# Patient Record
Sex: Female | Born: 1955
Health system: Southern US, Community
[De-identification: ages and names within clinical notes are randomized; demographics above are authoritative.]

## PROBLEM LIST (undated history)

## (undated) DIAGNOSIS — K5900 Constipation, unspecified: Secondary | ICD-10-CM

## (undated) DIAGNOSIS — E119 Type 2 diabetes mellitus without complications: Secondary | ICD-10-CM

## (undated) DIAGNOSIS — IMO0001 Reserved for inherently not codable concepts without codable children: Secondary | ICD-10-CM

## (undated) DIAGNOSIS — I313 Pericardial effusion (noninflammatory): Secondary | ICD-10-CM

## (undated) DIAGNOSIS — I3139 Other pericardial effusion (noninflammatory): Secondary | ICD-10-CM

## (undated) DIAGNOSIS — J95821 Acute postprocedural respiratory failure: Secondary | ICD-10-CM

## (undated) DIAGNOSIS — I340 Nonrheumatic mitral (valve) insufficiency: Secondary | ICD-10-CM

## (undated) DIAGNOSIS — Z9889 Other specified postprocedural states: Secondary | ICD-10-CM

## (undated) DIAGNOSIS — I272 Pulmonary hypertension, unspecified: Secondary | ICD-10-CM

## (undated) DIAGNOSIS — E785 Hyperlipidemia, unspecified: Secondary | ICD-10-CM

## (undated) DIAGNOSIS — I5032 Chronic diastolic (congestive) heart failure: Secondary | ICD-10-CM

## (undated) DIAGNOSIS — K5792 Diverticulitis of intestine, part unspecified, without perforation or abscess without bleeding: Secondary | ICD-10-CM

## (undated) DIAGNOSIS — D649 Anemia, unspecified: Secondary | ICD-10-CM

## (undated) DIAGNOSIS — I639 Cerebral infarction, unspecified: Secondary | ICD-10-CM

## (undated) DIAGNOSIS — I10 Essential (primary) hypertension: Secondary | ICD-10-CM

## (undated) DIAGNOSIS — J9600 Acute respiratory failure, unspecified whether with hypoxia or hypercapnia: Secondary | ICD-10-CM

## (undated) DIAGNOSIS — R011 Cardiac murmur, unspecified: Secondary | ICD-10-CM

## (undated) DIAGNOSIS — Z973 Presence of spectacles and contact lenses: Secondary | ICD-10-CM

## (undated) DIAGNOSIS — N184 Chronic kidney disease, stage 4 (severe): Secondary | ICD-10-CM

## (undated) DIAGNOSIS — Z9289 Personal history of other medical treatment: Secondary | ICD-10-CM

## (undated) HISTORY — DX: Anemia, unspecified: D64.9

## (undated) HISTORY — DX: Chronic diastolic (congestive) heart failure: I50.32

## (undated) HISTORY — DX: Type 2 diabetes mellitus without complications: E11.9

## (undated) HISTORY — DX: Diverticulitis of intestine, part unspecified, without perforation or abscess without bleeding: K57.92

## (undated) HISTORY — PX: COLONOSCOPY: SHX174

## (undated) HISTORY — DX: Essential (primary) hypertension: I10

## (undated) HISTORY — DX: Pulmonary hypertension, unspecified: I27.20

## (undated) HISTORY — DX: Nonrheumatic mitral (valve) insufficiency: I34.0

## (undated) HISTORY — PX: CARDIAC CATHETERIZATION: SHX172

## (undated) HISTORY — DX: Cerebral infarction, unspecified: I63.9

---

## 1995-11-28 DIAGNOSIS — I639 Cerebral infarction, unspecified: Secondary | ICD-10-CM

## 1995-11-28 HISTORY — DX: Cerebral infarction, unspecified: I63.9

## 1998-04-19 ENCOUNTER — Encounter: Admission: RE | Admit: 1998-04-19 | Discharge: 1998-04-19 | Payer: Self-pay | Admitting: Internal Medicine

## 1998-04-19 ENCOUNTER — Other Ambulatory Visit: Admission: RE | Admit: 1998-04-19 | Discharge: 1998-04-19 | Payer: Self-pay

## 1998-04-21 ENCOUNTER — Encounter: Admission: RE | Admit: 1998-04-21 | Discharge: 1998-04-21 | Payer: Self-pay | Admitting: Hematology and Oncology

## 1998-07-16 ENCOUNTER — Encounter: Admission: RE | Admit: 1998-07-16 | Discharge: 1998-07-16 | Payer: Self-pay | Admitting: Internal Medicine

## 1998-07-27 ENCOUNTER — Emergency Department (HOSPITAL_COMMUNITY): Admission: EM | Admit: 1998-07-27 | Discharge: 1998-07-27 | Payer: Self-pay | Admitting: Emergency Medicine

## 1998-09-13 ENCOUNTER — Ambulatory Visit (HOSPITAL_COMMUNITY): Admission: RE | Admit: 1998-09-13 | Discharge: 1998-09-13 | Payer: Self-pay | Admitting: Hematology and Oncology

## 1998-09-13 ENCOUNTER — Encounter: Admission: RE | Admit: 1998-09-13 | Discharge: 1998-09-13 | Payer: Self-pay | Admitting: Hematology and Oncology

## 1998-12-07 ENCOUNTER — Encounter: Admission: RE | Admit: 1998-12-07 | Discharge: 1998-12-07 | Payer: Self-pay | Admitting: Internal Medicine

## 1998-12-27 ENCOUNTER — Ambulatory Visit (HOSPITAL_COMMUNITY): Admission: RE | Admit: 1998-12-27 | Discharge: 1998-12-27 | Payer: Self-pay | Admitting: *Deleted

## 1999-05-16 ENCOUNTER — Encounter: Admission: RE | Admit: 1999-05-16 | Discharge: 1999-05-16 | Payer: Self-pay | Admitting: Internal Medicine

## 1999-06-21 ENCOUNTER — Encounter: Admission: RE | Admit: 1999-06-21 | Discharge: 1999-06-21 | Payer: Self-pay | Admitting: Internal Medicine

## 2000-02-20 ENCOUNTER — Emergency Department (HOSPITAL_COMMUNITY): Admission: EM | Admit: 2000-02-20 | Discharge: 2000-02-20 | Payer: Self-pay | Admitting: Emergency Medicine

## 2000-12-11 ENCOUNTER — Other Ambulatory Visit: Admission: RE | Admit: 2000-12-11 | Discharge: 2000-12-11 | Payer: Self-pay | Admitting: Internal Medicine

## 2001-02-19 ENCOUNTER — Encounter: Payer: Self-pay | Admitting: Internal Medicine

## 2001-02-19 ENCOUNTER — Ambulatory Visit (HOSPITAL_COMMUNITY): Admission: RE | Admit: 2001-02-19 | Discharge: 2001-02-19 | Payer: Self-pay | Admitting: Internal Medicine

## 2002-03-06 ENCOUNTER — Ambulatory Visit (HOSPITAL_COMMUNITY): Admission: RE | Admit: 2002-03-06 | Discharge: 2002-03-06 | Payer: Self-pay | Admitting: Internal Medicine

## 2002-03-06 ENCOUNTER — Encounter: Payer: Self-pay | Admitting: Internal Medicine

## 2003-02-03 ENCOUNTER — Emergency Department (HOSPITAL_COMMUNITY): Admission: EM | Admit: 2003-02-03 | Discharge: 2003-02-03 | Payer: Self-pay | Admitting: Emergency Medicine

## 2003-03-31 ENCOUNTER — Encounter: Payer: Self-pay | Admitting: Internal Medicine

## 2003-03-31 ENCOUNTER — Ambulatory Visit (HOSPITAL_COMMUNITY): Admission: RE | Admit: 2003-03-31 | Discharge: 2003-03-31 | Payer: Self-pay | Admitting: Internal Medicine

## 2005-05-15 ENCOUNTER — Ambulatory Visit (HOSPITAL_COMMUNITY): Admission: RE | Admit: 2005-05-15 | Discharge: 2005-05-15 | Payer: Self-pay | Admitting: Gastroenterology

## 2006-07-12 ENCOUNTER — Ambulatory Visit (HOSPITAL_COMMUNITY): Admission: RE | Admit: 2006-07-12 | Discharge: 2006-07-12 | Payer: Self-pay | Admitting: Internal Medicine

## 2007-08-01 ENCOUNTER — Ambulatory Visit (HOSPITAL_COMMUNITY): Admission: RE | Admit: 2007-08-01 | Discharge: 2007-08-01 | Payer: Self-pay | Admitting: Internal Medicine

## 2007-11-27 ENCOUNTER — Other Ambulatory Visit: Admission: RE | Admit: 2007-11-27 | Discharge: 2007-11-27 | Payer: Self-pay | Admitting: Internal Medicine

## 2007-11-27 LAB — HM PAP SMEAR
HM Pap smear: NEGATIVE
HM Pap smear: NEGATIVE

## 2008-07-28 ENCOUNTER — Emergency Department (HOSPITAL_COMMUNITY): Admission: EM | Admit: 2008-07-28 | Discharge: 2008-07-29 | Payer: Self-pay | Admitting: Emergency Medicine

## 2008-08-13 ENCOUNTER — Encounter (INDEPENDENT_AMBULATORY_CARE_PROVIDER_SITE_OTHER): Payer: Self-pay | Admitting: Emergency Medicine

## 2008-08-13 ENCOUNTER — Inpatient Hospital Stay (HOSPITAL_COMMUNITY): Admission: EM | Admit: 2008-08-13 | Discharge: 2008-08-17 | Payer: Self-pay | Admitting: Emergency Medicine

## 2009-01-08 ENCOUNTER — Ambulatory Visit (HOSPITAL_COMMUNITY): Admission: RE | Admit: 2009-01-08 | Discharge: 2009-01-08 | Payer: Self-pay | Admitting: Internal Medicine

## 2009-01-13 ENCOUNTER — Encounter (INDEPENDENT_AMBULATORY_CARE_PROVIDER_SITE_OTHER): Payer: Self-pay | Admitting: Internal Medicine

## 2009-01-13 ENCOUNTER — Encounter: Admission: RE | Admit: 2009-01-13 | Discharge: 2009-01-13 | Payer: Self-pay | Admitting: Internal Medicine

## 2009-01-13 ENCOUNTER — Ambulatory Visit (HOSPITAL_COMMUNITY): Admission: RE | Admit: 2009-01-13 | Discharge: 2009-01-13 | Payer: Self-pay | Admitting: Internal Medicine

## 2010-01-14 ENCOUNTER — Ambulatory Visit (HOSPITAL_COMMUNITY): Admission: RE | Admit: 2010-01-14 | Discharge: 2010-01-14 | Payer: Self-pay | Admitting: Internal Medicine

## 2011-01-31 ENCOUNTER — Other Ambulatory Visit (HOSPITAL_COMMUNITY): Payer: Self-pay | Admitting: Internal Medicine

## 2011-01-31 DIAGNOSIS — Z1231 Encounter for screening mammogram for malignant neoplasm of breast: Secondary | ICD-10-CM

## 2011-02-06 ENCOUNTER — Ambulatory Visit (HOSPITAL_COMMUNITY)
Admission: RE | Admit: 2011-02-06 | Discharge: 2011-02-06 | Disposition: A | Discharge status: Home / Self Care | Payer: BC Managed Care – PPO | Source: Ambulatory Visit | Attending: Internal Medicine | Admitting: Internal Medicine

## 2011-02-06 DIAGNOSIS — Z1231 Encounter for screening mammogram for malignant neoplasm of breast: Secondary | ICD-10-CM | POA: Insufficient documentation

## 2011-03-30 ENCOUNTER — Inpatient Hospital Stay (INDEPENDENT_AMBULATORY_CARE_PROVIDER_SITE_OTHER)
Admission: RE | Admit: 2011-03-30 | Discharge: 2011-03-30 | Disposition: A | Discharge status: Home / Self Care | Payer: BC Managed Care – PPO | Source: Ambulatory Visit | Attending: Family Medicine | Admitting: Family Medicine

## 2011-03-30 DIAGNOSIS — E119 Type 2 diabetes mellitus without complications: Secondary | ICD-10-CM

## 2011-03-30 DIAGNOSIS — M5430 Sciatica, unspecified side: Secondary | ICD-10-CM

## 2011-03-30 DIAGNOSIS — I10 Essential (primary) hypertension: Secondary | ICD-10-CM

## 2011-04-11 NOTE — H&P (Signed)
Jody Nguyen, LA NO.:  [id number]   MEDICAL RECORD NO.:  [id number]          PATIENT TYPE:  EMS   LOCATION:  MAJO                         FACILITY:  [facility]   PHYSICIAN:  Rexene Alberts, M.D.    DATE OF BIRTH:  March 25, 1956   DATE OF ADMISSION:  08/13/2008  DATE OF DISCHARGE:                              HISTORY & PHYSICAL   PRIMARY CARE PHYSICIAN:  Unk Pinto, M.D.   CHIEF COMPLAINT:  Abnormal labs at the doctor's office.  Also, resolving  nausea and vomiting.   HISTORY OF PRESENT ILLNESS:  The patient is a 55 year old woman with a  past medical history significant for hypertension, type 2 diabetes  mellitus, and hyperlipidemia.  She presents to the emergency department  after her primary care physician, Dr. Melford Aase, called her because of  abnormal lab results as ordered yesterday on August 12, 2008.  The  patient actually presented to the emergency department on July 28, 2008, with a chief complaint of abdominal pain.  During the evaluation  in the emergency department at that time, a CT scan of the abdomen and  pelvis with IV and oral contrast was ordered.  The results were  significant for uncomplicated diverticulitis of the distal descending  colon.  The patient was prescribed Cipro and Flagyl and discharged home.  Over the days following the discharge, the patient's abdominal pain  resolved.  However, 3-4 days ago, she developed a generalized ill  feeling and later experienced several episodes of nausea and vomiting.  Over the past 48 hours, she estimates that she has vomited at least  three to four times.  She denies coffee-ground emesis.  She denies  ongoing abdominal pain.  She does have a very poor appetite and has  eaten very little over the past 2 days.  She says that her urine output  has been about the same.  She denies painful urination, urinary  frequency, or urinary hesitancy.  Because of the nausea and vomiting,  she presented to  Dr. Idell Pickles office yesterday.  He ordered lab  studies.  The results from the laboratory studies as faxed over to the  emergency department by Dr. Melford Aase revealed that the patient's BUN was  103 and her creatinine was 13.2.  In comparison, when the patient was  evaluated in the emergency department on September 1, her BUN was within  normal limits at 10 and her creatinine was within normal limits at 0.92.  The patient will be admitted for further evaluation and management.   PAST MEDICAL HISTORY:  1. Hypertension.  2. Type 2 diabetes mellitus.  3. Hyperlipidemia.  4. Acute stroke in 1997 with no residual deficits  5. C-section x2.  6. Normal colonoscopy in June 2006 by Dr. Collene Mares  7. Acute diverticulitis, diagnosed July 28, 2008.   MEDICATIONS:  1. Micardis 80 mg daily.  2. Glyburide 5 mg half a tablet b.i.d.  3. Lasix 40 mg daily.  4. Metformin 1000 mg b.i.d.  5. Clonidine 0.3 mg t.i.d.  6. Crestor 20 mg half a tablet three times per week.  7. Amlodipine 10  mg daily.  8. Bystolic 10 mg daily.  9. Januvia 100 mg daily.  10.Aspirin 81 mg daily.  11.Vitamin D 2000 units daily.   ALLERGIES:  NO KNOWN DRUG ALLERGIES.   SOCIAL HISTORY:  The patient is married.  She lives in [place],  [place].  She has three children.  She is currently unemployed.  She denies tobacco, alcohol and illicit drug use.   FAMILY HISTORY:  Her mother died of a stroke at 80 years of age.  The  health and whereabouts of her father are unknown.  Her brother who is 78  years of age has heart disease.  Her sister who is 36 years of age has a  history of a stroke.   REVIEW OF SYSTEMS:  The patient's review of systems is positive for  generalized ill feeling, poor appetite, and resolving nausea and  vomiting.  Her review of systems is negative for fever, chills, upper  respiratory infection symptoms, chest pain, shortness of breath,  abdominal pain, diarrhea, constipation, melena,  hematochezia, painful  urination, and swelling in her legs.   PHYSICAL EXAMINATION:  VITAL SIGNS:  Temperature 98.1, blood pressure  101/67, pulse 65, respiratory rate 20, oxygen saturation 95% on room  air.  GENERAL:  The patient is a pleasant obese 55 year old African  American woman who is currently lying in bed in no acute distress.  HEENT:  Head is normocephalic nontraumatic.  Pupils equal, round,  reactive to light.  Extraocular muscles are intact.  Conjunctivae are  clear.  Sclerae are white.  Tympanic membranes not examined.  Nasal  mucosa reveals dry mucous membranes.  No sinus tenderness.  Oropharynx  reveals good dentition.  Mucous membranes are dry.  No posterior  exudates or erythema.  NECK:  Supple.  No adenopathy, no thyromegaly, no bruit or JVD.  LUNGS:  Clear to auscultation bilaterally.  HEART:  S1-S2 with a soft systolic murmur.  ABDOMEN:  Obese, positive bowel sounds, soft, nontender, nondistended.  No hepatosplenomegaly, no masses palpated.  GU/RECTAL:  Deferred.  EXTREMITIES:  Pedal pulses are palpable at 2+ bilaterally.  No pretibial  edema and no pedal edema.  NEUROLOGIC:  The patient is alert and oriented x3.  Cranial nerves II-  XII are intact.  Strength is 5/5 throughout.  Sensation is intact.   ADMISSION LABORATORY:  Sodium 129, potassium 3.8, chloride 95, CO2 20,  glucose 205, BUN 104, creatinine 12.42, total bilirubin 1.3, alkaline  phosphatase 46, SGOT 44, SGPT 36, total protein 7.8, albumin 3.6,  calcium 8.3.  Urinalysis:  Urine blood, small urine protein 100, urine  nitrite negative, urine leukocytes large.  Micro urine with squamous  cells many, WBCs 21-50, RBCs 0-2, bacteria many.   ASSESSMENT:  1. Generalized nausea and vomiting, more than likely secondary to      acute renal failure, possibly uremia.  The patient's nausea and      vomiting are actually resolving.  She was able to eat breakfast and      keep it down this morning.  She has no  complaints of abdominal      pain.  She was treated recently for diverticulitis with Cipro and      Flagyl which she says she completed.  2. Acute renal failure.  The patient probably has acute renal failure      from dye/contrast nephropathy.  She does have underlying diabetes      mellitus and hypertension.  However, her renal function was clearly  within normal limits on July 28, 2008.  3. Mild hepatic transaminitis.  The patient has no complaints of      abdominal pain and her abdomen is benign on exam.  The mildly      elevated liver transaminases may be the consequence of Crestor.  4. Type 2 diabetes mellitus.  The patient's venous glucose is      moderately elevated.  She is treated chronically with Januvia,      glyburide, and metformin.  5. Hypertension.  The patient's blood pressure is controlled at this      point.  She is treated chronically with Micardis, Lasix, clonidine,      amlodipine, and Bystolic.  6. Pyuria.   PLAN:  1. The patient will be admitted for further evaluation and management.  2. Will start gentle IV fluids, supportive treatment, and antiemetic      therapy.  3. Will check a renal ultrasound, urine culture, and urine      eosinophils.  4. Will consult nephrology.  5. Will treat the patient empirically with 1 gram of Rocephin IV      daily.  6. Will hold oral hypoglycemic agents in the setting of acute renal      failure.  We will also hold the Lasix and Micardis.      Rexene Alberts, M.D.  Electronically Signed     DF/MEDQ  D:  08/13/2008  T:  08/13/2008  Job:  [id number]   cc:   Unk Pinto, M.D.

## 2011-04-11 NOTE — Discharge Summary (Signed)
Jody Nguyen, Jody Nguyen              ACCOUNT NO.:  [id number]   MEDICAL RECORD NO.:  [id number]          PATIENT TYPE:  INP   LOCATION:  [id number]                         FACILITY:  [facility]   PHYSICIAN:  Domingo Mend, M.D. DATE OF BIRTH:  Jul 14, 1956   DATE OF ADMISSION:  08/13/2008  DATE OF DISCHARGE:  08/17/2008                               DISCHARGE SUMMARY   DISCHARGE DIAGNOSES:  1. Acute renal failure secondary to angiotensin receptor blocker plus      dehydration plus intravenous contrast dye.  2. Hypertension.  3. Type 2 diabetes.  4. Hyperlipidemia.  5. History of cerebrovascular accident in 1997 with no residual      deficits.   DISCHARGE MEDICATIONS:  1. Bystolic 20 mg p.o. daily.  2. Glyburide 5 mg 1/2 pill twice daily.  3. Lasix 40 mg daily.  4. Metformin 1000 mg b.i.d.  5. Clonidine 0.3 mg t.i.d.  6. Crestor 20 mg half a pill 3 times a week.  7. Amlodipine 10 mg p.o. daily.  8. Januvia 100 mg p.o. daily.  9. Aspirin 81 mg daily.  10.Vitamin D 2000 units daily.   DISPOSITION AND FOLLOWUP:  The patient is discharged home today in a  stable condition.  She is advised to call her primary care physician,  Dr. Melford Aase, and schedule an appointment for her at the latest on  Wednesday of this week.  At that time, BMET should be drawn to follow a  resolution of her acute renal failure.  Upon admission, her creatinine  was about 13; it is 2.15 upon discharge today.   CONSULTATIONS:  Alvin C. Florene Glen, M.D. and Sol Blazing, M.D. of  [place].   IMAGING PERFORMED DURING THIS HOSPITALIZATION:  Includes a renal  ultrasound performed on August 14, 2008, consistent with a bilateral  renal enlargement with increased renal cortical echogenicity.  No  evidence of hydronephrosis.   HISTORY OF PRESENT ILLNESS:  For full details, please refer to history  and physical exam dictated by Dr. Rexene Alberts on August 13, 2008,  but in brief, [name] is a  pleasant 55 year old African American  woman with past medical history significant of hypertension, type 2  diabetes, and hyperlipidemia who went to see her primary care physician,  Dr. Melford Aase on August 12, 2008, and was called by her physician and  told to come into the emergency department secondary to abnormal labs  with lab being a creatinine of 13.2 and a BUN of 103.  Her baseline BUN  and creatinine drawn on July 28, 2008, was a BUN of 10 and a  creatinine of 0.92.  Of note, on July 28, 2008, she came into the  emergency department secondary to abdominal pain where a CT scan of  abdomen and pelvis with IV contrast was ordered, which showed  uncomplicated diverticulitis of the distal descending colon.  The  patient was actually asymptomatic.   HOSPITAL COURSE:  1. Acute renal failure.  Which we believe at this time was secondary      to a combination of IV volume depletion plus the IV contrast plus  the ARB that she was on.  Nephrology consultation was obtained in      the hospital.  We have held her ARB, and she will not be discharged      on it at this time.  She has had a remarkable improvement of her      renal function from a creatinine of 13.2 down to a creatinine of      2.15 today.  Both Nephrology and we believe that she will have a      complete renal recovery with no deficits.  She is instructed to      follow up with her primary care physician, Dr. Melford Aase, on      Wednesday, at which time a stat BMET should be drawn to make sure      that her creatinine continues to normalize.  She has been      completely asymptomatic throughout the hospitalization.  2. Hypertension.  Her blood pressure did increase secondary to      discontinuation of the Micardis, so I have increased her Bystolic      to 20 mg daily from 10 mg daily.  It should be followed up as an      outpatient.  3. She had good glycemia control in the hospital, but she was on      sliding scale  insulin.  She will be discharged on her home regimen      of glyburide, metformin, and Januvia, and they should be followed      up in the outpatient setting.  4. Hyperlipidemia.  Her Crestor will be initiated at discharge.   Vital signs on day of discharge, blood pressure 156/94, heart rate 65,  respirations 16, and O2 sats 98% on room air with a temperature of 98.5.   Labs on day of discharge, sodium 138, potassium 3.7, chloride 107,  bicarb 25, BUN 34, creatinine of 2.15, and a glucose of 134.      Domingo Mend, M.D.  Electronically Signed     EH/MEDQ  D:  08/17/2008  T:  08/17/2008  Job:  [id number]   cc:   Unk Pinto, M.D.  Sol Blazing, M.D.  Darrold Span. Florene Glen, M.D.

## 2011-04-11 NOTE — Consult Note (Signed)
NAMEGUADALUPE, MCFARLANE              ACCOUNT NO.:  [id number]   MEDICAL RECORD NO.:  [id number]          PATIENT TYPE:  INP   LOCATION:  [id number]                         FACILITY:  [facility]   PHYSICIAN:  Alvin C. Florene Glen, M.D.  DATE OF BIRTH:  07-07-1956   DATE OF CONSULTATION:  DATE OF DISCHARGE:                                 CONSULTATION   I was asked by Dr. Caryn Section to see this 55 year old female with acute  renal failure.  The patient went to a local medical doctor the day prior  to admission with low blood sugar and was found to have renal failure  with a BUN of 103 and creatinine of 13.2.  The patient was seen at [facility] on July 29, 2008, with abdominal pain and CT scan of  the abdomen with contrast showed evidence of diverticulitis.  Serum  creatinine at that time was 0.92 mg/dL on July 28, 2008.  Today, BUN  is 104 and creatinine 12.42.  We were asked to see her for further  evaluation.  The patient does complain of malaise.  The patient reports  that she was feeling very poorly beginning of the month after she  underwent CAT scan.  She has fair amount of nausea and vomiting and  diminished oral intake.  She also takes Micardis for high blood  pressure.   PAST MEDICAL HISTORY:  Hypertension, diabetes, dyslipidemia, history of  prior stroke, and diverticulitis.   CURRENT MEDICATIONS:  1. Micardis.  2. Glyburide.  3. Lasix.  4. Metformin.  5. Clonidine.  6. Crestor.  7. Amlodipine.  8. Bystolic.  9. Aspirin.  10.Vitamin D.  11.__________.   ALLERGIES:  No known allergies to medications.   SOCIAL HISTORY:  She is married.  She has 3 children.  She does not  smoke or drink alcohol or consume illegal substances.   FAMILY HISTORY:  Negative for kidney disease.   REVIEW OF SYSTEMS:  Essentially noncontributory.   PHYSICAL EXAMINATION:  VITAL SIGNS:  Blood pressure is 101/67 and heart  rate is 65.  GENERAL:  Alert and oriented African American female.  HEENT:  Atraumatic and normocephalic.  No thyroid masses.  Extraocular  movements are intact.  NECK:  Supple.  LUNGS:  Clear.  HEART:  Regular rhythm and rate.  ABDOMEN:  Obese.  EXTREMITIES:  Without significant edema.  NEUROLOGIC:  No obvious focality.   ASSESSMENT:  1. Acute renal failure, etiology most likely on the basis of contrast.  2. Nephropathy with a component of an angiotensin receptor blocker and      probable dehydration.   RECOMMENDATIONS:  Supportive therapy.  We will check urinalysis.  We  will do dialysis as needed.           ______________________________  Darrold Span Florene Glen, M.D.     ACP/MEDQ  D:  08/13/2008  T:  08/14/2008  Job:  [id number]

## 2011-04-14 NOTE — Op Note (Signed)
Jody Nguyen, Jody Nguyen              ACCOUNT NO.:  [id number]   MEDICAL RECORD NO.:  [id number]          PATIENT TYPE:  AMB   LOCATION:  ENDO                         FACILITY:  [facility]   PHYSICIAN:  Nelwyn Salisbury, M.D.  DATE OF BIRTH:  [birth year]   DATE OF PROCEDURE:  05/15/2005  DATE OF DISCHARGE:                                 OPERATIVE REPORT   PROCEDURE PERFORMED:  Screening colonoscopy.   ENDOSCOPIST:  Nelwyn Salisbury, M.D.   INSTRUMENT USED:  Olympus video colonoscope.   INDICATIONS FOR PROCEDURE:  A 55 year old African-American female undergoing  screening colonoscopy to rule out colonic polyps, masses, etc.   PREPROCEDURE PREPARATION:  Informed consent was procured from the patient.  The patient was fasted for eight hours prior to procedure and prepped with a  bottle of magnesium citrate and gallon of GoLYTELY the night prior to  procedure.  The risks and benefits of the procedure including a 10% risk  rate of cancer and polyps was discussed with the patient as well.   PREPROCEDURE PHYSICAL:  VITAL SIGNS:  The patient has stable vital signs.  NECK:  Supple.  CHEST:  Clear to auscultation.  CARDIAC:  S1, S2 regular.  ABDOMEN:  Soft with normal bowel sounds.   DESCRIPTION OF PROCEDURE:  The patient was placed in the left lateral  decubitus position and sedated with 80 mg of Demerol and 8 mg of Versed in  slow incremental doses.  Once the patient was adequately sedated and  maintained on low-flow oxygen and continuous cardiac monitoring, the Olympus  video colonoscope was advanced from the rectum to the cecum.  The  appendicular orifice and ileocecal valve were visualized and photographed.  No masses, polyps, erosions, ulcerations, or diverticula were seen.  Retroflexion in the rectum revealed no abnormalities.   IMPRESSION:  Normal colonoscopy of the cecum.  No masses, polyps, or  diverticula seen.   RECOMMENDATIONS:  1.  Continue a high-fiber diet with liberal fluid  intake.  2.  Repeat colonoscopy in the next 10 years unless the patient develops any      abnormal symptoms in the interim.  3.  Outpatient followup if the need arises in the future.       JNM/MEDQ  D:  05/15/2005  T:  05/15/2005  Job:  [id number]   cc:   Ardeen Jourdain, M.D.  [place] [address]  Fax: [phone]

## 2011-07-24 ENCOUNTER — Emergency Department (HOSPITAL_COMMUNITY)
Admission: EM | Admit: 2011-07-24 | Discharge: 2011-07-25 | Disposition: A | Discharge status: Home / Self Care | Payer: BC Managed Care – PPO | Attending: Emergency Medicine | Admitting: Emergency Medicine

## 2011-07-24 DIAGNOSIS — Z8673 Personal history of transient ischemic attack (TIA), and cerebral infarction without residual deficits: Secondary | ICD-10-CM | POA: Insufficient documentation

## 2011-07-24 DIAGNOSIS — E119 Type 2 diabetes mellitus without complications: Secondary | ICD-10-CM | POA: Insufficient documentation

## 2011-07-24 DIAGNOSIS — E785 Hyperlipidemia, unspecified: Secondary | ICD-10-CM | POA: Insufficient documentation

## 2011-07-24 DIAGNOSIS — R112 Nausea with vomiting, unspecified: Secondary | ICD-10-CM | POA: Insufficient documentation

## 2011-07-24 DIAGNOSIS — N289 Disorder of kidney and ureter, unspecified: Secondary | ICD-10-CM | POA: Insufficient documentation

## 2011-07-24 DIAGNOSIS — I10 Essential (primary) hypertension: Secondary | ICD-10-CM | POA: Insufficient documentation

## 2011-07-24 LAB — DIFFERENTIAL
Basophils Absolute: 0 10*3/uL (ref 0.0–0.1)
Basophils Relative: 1 % (ref 0–1)
Eosinophils Absolute: 0.3 10*3/uL (ref 0.0–0.7)
Eosinophils Relative: 5 % (ref 0–5)
Lymphocytes Relative: 43 % (ref 12–46)
Lymphs Abs: 2.4 10*3/uL (ref 0.7–4.0)
Monocytes Absolute: 0.4 10*3/uL (ref 0.1–1.0)
Monocytes Relative: 7 % (ref 3–12)
Neutro Abs: 2.5 10*3/uL (ref 1.7–7.7)
Neutrophils Relative %: 45 % (ref 43–77)

## 2011-07-24 LAB — CBC
HCT: 34 % — ABNORMAL LOW (ref 36.0–46.0)
Hemoglobin: 11.6 g/dL — ABNORMAL LOW (ref 12.0–15.0)
MCH: 26.4 pg (ref 26.0–34.0)
MCHC: 34.1 g/dL (ref 30.0–36.0)
MCV: 77.4 fL — ABNORMAL LOW (ref 78.0–100.0)
Platelets: 206 10*3/uL (ref 150–400)
RBC: 4.39 MIL/uL (ref 3.87–5.11)
RDW: 14.2 % (ref 11.5–15.5)
WBC: 5.6 10*3/uL (ref 4.0–10.5)

## 2011-07-24 LAB — GLUCOSE, CAPILLARY: Glucose-Capillary: 244 mg/dL — ABNORMAL HIGH (ref 70–99)

## 2011-07-25 LAB — URINALYSIS, ROUTINE W REFLEX MICROSCOPIC
Bilirubin Urine: NEGATIVE
Glucose, UA: NEGATIVE mg/dL
Ketones, ur: NEGATIVE mg/dL
Nitrite: NEGATIVE
Protein, ur: 100 mg/dL — AB
Specific Gravity, Urine: 1.01 (ref 1.005–1.030)
Urobilinogen, UA: 0.2 mg/dL (ref 0.0–1.0)
pH: 5 (ref 5.0–8.0)

## 2011-07-25 LAB — COMPREHENSIVE METABOLIC PANEL
ALT: 48 U/L — ABNORMAL HIGH (ref 0–35)
AST: 34 U/L (ref 0–37)
Albumin: 3.4 g/dL — ABNORMAL LOW (ref 3.5–5.2)
Alkaline Phosphatase: 59 U/L (ref 39–117)
BUN: 36 mg/dL — ABNORMAL HIGH (ref 6–23)
CO2: 28 mEq/L (ref 19–32)
Calcium: 9.3 mg/dL (ref 8.4–10.5)
Chloride: 95 mEq/L — ABNORMAL LOW (ref 96–112)
Creatinine, Ser: 1.65 mg/dL — ABNORMAL HIGH (ref 0.50–1.10)
GFR calc Af Amer: 39 mL/min — ABNORMAL LOW (ref >60)
GFR calc non Af Amer: 32 mL/min — ABNORMAL LOW (ref >60)
Glucose, Bld: 206 mg/dL — ABNORMAL HIGH (ref 70–99)
Potassium: 3 mEq/L — ABNORMAL LOW (ref 3.5–5.1)
Sodium: 133 mEq/L — ABNORMAL LOW (ref 135–145)
Total Bilirubin: 0.5 mg/dL (ref 0.3–1.2)
Total Protein: 7.3 g/dL (ref 6.0–8.3)

## 2011-07-25 LAB — URINE MICROSCOPIC-ADD ON

## 2011-07-25 LAB — GLUCOSE, CAPILLARY: Glucose-Capillary: 193 mg/dL — ABNORMAL HIGH (ref 70–99)

## 2011-08-10 ENCOUNTER — Other Ambulatory Visit (HOSPITAL_COMMUNITY): Payer: Self-pay | Admitting: Internal Medicine

## 2011-08-10 ENCOUNTER — Ambulatory Visit (HOSPITAL_COMMUNITY)
Admission: RE | Admit: 2011-08-10 | Discharge: 2011-08-10 | Disposition: A | Discharge status: Home / Self Care | Payer: BC Managed Care – PPO | Source: Ambulatory Visit | Attending: Internal Medicine | Admitting: Internal Medicine

## 2011-08-10 DIAGNOSIS — R079 Chest pain, unspecified: Secondary | ICD-10-CM | POA: Insufficient documentation

## 2011-08-10 DIAGNOSIS — I10 Essential (primary) hypertension: Secondary | ICD-10-CM | POA: Insufficient documentation

## 2011-08-10 DIAGNOSIS — R0602 Shortness of breath: Secondary | ICD-10-CM

## 2011-08-10 DIAGNOSIS — I517 Cardiomegaly: Secondary | ICD-10-CM | POA: Insufficient documentation

## 2011-08-16 ENCOUNTER — Encounter: Payer: Self-pay | Admitting: Cardiology

## 2011-08-17 ENCOUNTER — Encounter: Payer: Self-pay | Admitting: Cardiology

## 2011-08-17 ENCOUNTER — Ambulatory Visit (INDEPENDENT_AMBULATORY_CARE_PROVIDER_SITE_OTHER): Payer: BC Managed Care – PPO | Admitting: Cardiology

## 2011-08-17 DIAGNOSIS — I10 Essential (primary) hypertension: Secondary | ICD-10-CM | POA: Insufficient documentation

## 2011-08-17 DIAGNOSIS — E785 Hyperlipidemia, unspecified: Secondary | ICD-10-CM

## 2011-08-17 DIAGNOSIS — R0600 Dyspnea, unspecified: Secondary | ICD-10-CM

## 2011-08-17 DIAGNOSIS — R0602 Shortness of breath: Secondary | ICD-10-CM

## 2011-08-17 DIAGNOSIS — R0989 Other specified symptoms and signs involving the circulatory and respiratory systems: Secondary | ICD-10-CM

## 2011-08-17 DIAGNOSIS — E1169 Type 2 diabetes mellitus with other specified complication: Secondary | ICD-10-CM | POA: Insufficient documentation

## 2011-08-17 DIAGNOSIS — R0609 Other forms of dyspnea: Secondary | ICD-10-CM

## 2011-08-17 NOTE — Patient Instructions (Signed)
Your physician has requested that you have en exercise stress myoview. For further information please visit [email or web address]. Please follow instruction sheet, as given.   Your physician has requested that you have an echocardiogram. Echocardiography is a painless test that uses sound waves to create images of your heart. It provides your doctor with information about the size and shape of your heart and how well your heart's chambers and valves are working. This procedure takes approximately one hour. There are no restrictions for this procedure.

## 2011-08-17 NOTE — Assessment & Plan Note (Signed)
Continue present blood pressure medications. Followup with primary care for this issue.

## 2011-08-17 NOTE — Assessment & Plan Note (Signed)
Etiology unclear. She has multiple cardiac risk factors including 10 years of diabetes mellitus. She also has hypertension. Her electrocardiogram shows left ventricular hypertrophy and there are repolarization abnormalities. Plan stress Myoview for risk stratification. Check echocardiogram for systolic and diastolic function. There may be a component of obesity hypoventilation syndrome. She has been scheduled for a sleep study by her primary care physician. No risk factors for pulmonary embolus.

## 2011-08-17 NOTE — Progress Notes (Signed)
HPI: 55 year old female with no prior cardiac history for evaluation of dyspnea. Chest x-ray on August 10, 2011 revealed cardiac enlargement and pulmonary venous congestion/pulmonary vascular hypertension. Patient complains of approximately 3-4 weeks of increased dyspnea on exertion. There is no orthopnea, PND, pedal edema, palpitations or syncope. She occasionally feels a brief chest pain for 3-4 seconds but does not have exertional chest pain. Because of the above we were asked to further evaluate. Note she has not traveled recently.  Current Outpatient Prescriptions  Medication Sig Dispense Refill  . amLODipine (NORVASC) 10 MG tablet Take 10 mg by mouth daily.        Marland Kitchen aspirin 81 MG tablet Take 81 mg by mouth daily.        . cloNIDine (CATAPRES) 0.2 MG tablet Take 1 tablet by mouth Twice daily.      . ferrous sulfate 325 (65 FE) MG EC tablet Take 325 mg by mouth daily.        . furosemide (LASIX) 40 MG tablet Take 40 mg by mouth 2 (two) times daily. 11/2 tablets once daily       . glyBURIDE (DIABETA) 5 MG tablet Take 1 tablet by mouth Twice daily.      Marland Kitchen losartan (COZAAR) 100 MG tablet Take 100 mg by mouth daily.        . metFORMIN (GLUCOPHAGE) 1000 MG tablet Take 1 tablet by mouth Twice daily.      . minoxidil (LONITEN) 10 MG tablet Take 1 tablet by mouth Daily.      . Nebivolol HCl (BYSTOLIC) 20 MG TABS Take 1 tablet by mouth daily.        . potassium chloride SA (K-DUR,KLOR-CON) 20 MEQ tablet Take 20 mEq by mouth daily.        . rosuvastatin (CRESTOR) 20 MG tablet Take 20 mg by mouth daily.        Marland Kitchen VICTOZA 18 MG/3ML SOLN as directed.        No Known Allergies  Past Medical History  Diagnosis Date  . Obesity   . Elevated cholesterol   . Vitamin D deficiency   . DM (diabetes mellitus)   . Hypertension   . CVA (cerebral infarction)     Past Surgical History  Procedure Date  . Cesarean section     History   Social History  . Marital Status: Married    Spouse Name: N/A    Number of Children: 3  . Years of Education: N/A   Occupational History  .      Unemployed   Social History Main Topics  . Smoking status: Never Smoker   . Smokeless tobacco: Not on file  . Alcohol Use: Not on file  . Drug Use: Not on file  . Sexually Active: Not on file   Other Topics Concern  . Not on file   Social History Narrative  . No narrative on file    Family History  Problem Relation Age of Onset  . Heart attack Brother     MI in his 62s  . Stroke Sister   . Breast cancer Sister   . Stroke Mother     ROS: no fevers or chills, productive cough, hemoptysis, dysphasia, odynophagia, melena, hematochezia, dysuria, hematuria, rash, seizure activity, orthopnea, PND, pedal edema, claudication. Remaining systems are negative.  Physical Exam: General:  Well developed/obese in NAD Skin warm/dry Patient not depressed No peripheral clubbing Back-normal HEENT-normal/normal eyelids Neck supple/normal carotid upstroke bilaterally; no bruits; no JVD; no thyromegaly  chest - CTA/ normal expansion CV - RRR/normal S1 and S2; no murmurs, rubs or gallops;  PMI nondisplaced Abdomen -NT/ND, no HSM, no mass, + bowel sounds, no bruit 2+ femoral pulses, no bruits Ext-no edema, chords, 2+ DP Neuro-grossly nonfocal  ECG 08/10/11 - NSR, LVH, prolonged QT, Lateral TWI

## 2011-08-17 NOTE — Assessment & Plan Note (Signed)
Management per primary care. 

## 2011-08-24 ENCOUNTER — Encounter: Payer: Self-pay | Admitting: *Deleted

## 2011-08-28 LAB — LIPID PANEL
Cholesterol: 125
HDL: 31 — ABNORMAL LOW
LDL Cholesterol: 68
Total CHOL/HDL Ratio: 4
Triglycerides: 131
VLDL: 26

## 2011-08-28 LAB — FERRITIN: Ferritin: 314 — ABNORMAL HIGH (ref 10–291)

## 2011-08-28 LAB — COMPREHENSIVE METABOLIC PANEL
ALT: 36 — ABNORMAL HIGH
AST: 44 — ABNORMAL HIGH
Albumin: 3.6
Alkaline Phosphatase: 46
BUN: 104 — ABNORMAL HIGH
CO2: 20
Calcium: 8.3 — ABNORMAL LOW
Chloride: 95 — ABNORMAL LOW
Creatinine, Ser: 12.42 — ABNORMAL HIGH
GFR calc Af Amer: 4 — ABNORMAL LOW
GFR calc non Af Amer: 3 — ABNORMAL LOW
Glucose, Bld: 205 — ABNORMAL HIGH
Potassium: 3.8
Sodium: 129 — ABNORMAL LOW
Total Bilirubin: 1.3 — ABNORMAL HIGH
Total Protein: 7.8

## 2011-08-28 LAB — DIFFERENTIAL
Basophils Absolute: 0
Basophils Relative: 0
Eosinophils Absolute: 0.3
Eosinophils Relative: 5
Lymphocytes Relative: 24
Lymphs Abs: 1.4
Monocytes Absolute: 0.6
Monocytes Relative: 11
Neutro Abs: 3.6
Neutrophils Relative %: 60

## 2011-08-28 LAB — URINE MICROSCOPIC-ADD ON

## 2011-08-28 LAB — GLUCOSE, CAPILLARY
Glucose-Capillary: 115 — ABNORMAL HIGH
Glucose-Capillary: 124 — ABNORMAL HIGH
Glucose-Capillary: 126 — ABNORMAL HIGH
Glucose-Capillary: 130 — ABNORMAL HIGH
Glucose-Capillary: 139 — ABNORMAL HIGH
Glucose-Capillary: 141 — ABNORMAL HIGH
Glucose-Capillary: 142 — ABNORMAL HIGH
Glucose-Capillary: 152 — ABNORMAL HIGH
Glucose-Capillary: 153 — ABNORMAL HIGH
Glucose-Capillary: 157 — ABNORMAL HIGH
Glucose-Capillary: 183 — ABNORMAL HIGH
Glucose-Capillary: 197 — ABNORMAL HIGH
Glucose-Capillary: 206 — ABNORMAL HIGH
Glucose-Capillary: 240 — ABNORMAL HIGH

## 2011-08-28 LAB — URINALYSIS, ROUTINE W REFLEX MICROSCOPIC
Bilirubin Urine: NEGATIVE
Bilirubin Urine: NEGATIVE
Glucose, UA: NEGATIVE
Glucose, UA: NEGATIVE
Ketones, ur: NEGATIVE
Ketones, ur: NEGATIVE
Nitrite: NEGATIVE
Nitrite: NEGATIVE
Protein, ur: 100 — AB
Protein, ur: 30 — AB
Specific Gravity, Urine: 1.011
Specific Gravity, Urine: 1.012
Urobilinogen, UA: 0.2
Urobilinogen, UA: 0.2
pH: 5
pH: 5

## 2011-08-28 LAB — CBC
HCT: 30.2 — ABNORMAL LOW
HCT: 30.8 — ABNORMAL LOW
HCT: 31 — ABNORMAL LOW
Hemoglobin: 10.1 — ABNORMAL LOW
Hemoglobin: 10.1 — ABNORMAL LOW
Hemoglobin: 10.4 — ABNORMAL LOW
MCHC: 32.8
MCHC: 33.4
MCHC: 33.5
MCV: 82.7
MCV: 83.2
MCV: 83.9
Platelets: 305
Platelets: 333
Platelets: 337
RBC: 3.63 — ABNORMAL LOW
RBC: 3.68 — ABNORMAL LOW
RBC: 3.75 — ABNORMAL LOW
RDW: 14.6
RDW: 14.7
RDW: 14.9
WBC: 5.9
WBC: 6.5
WBC: 7.4

## 2011-08-28 LAB — URINE CULTURE
Colony Count: 40000
Colony Count: 8000

## 2011-08-28 LAB — IRON AND TIBC
Iron: 178 — ABNORMAL HIGH
Saturation Ratios: 69 — ABNORMAL HIGH
TIBC: 258
UIBC: 80

## 2011-08-28 LAB — BASIC METABOLIC PANEL
BUN: 34 — ABNORMAL HIGH
BUN: 51 — ABNORMAL HIGH
CO2: 23
CO2: 25
Calcium: 8.1 — ABNORMAL LOW
Calcium: 8.3 — ABNORMAL LOW
Chloride: 104
Chloride: 107
Creatinine, Ser: 2.15 — ABNORMAL HIGH
Creatinine, Ser: 3.45 — ABNORMAL HIGH
GFR calc Af Amer: 17 — ABNORMAL LOW
GFR calc Af Amer: 29 — ABNORMAL LOW
GFR calc non Af Amer: 14 — ABNORMAL LOW
GFR calc non Af Amer: 24 — ABNORMAL LOW
Glucose, Bld: 127 — ABNORMAL HIGH
Glucose, Bld: 134 — ABNORMAL HIGH
Potassium: 3.7
Potassium: 3.7
Sodium: 138
Sodium: 139

## 2011-08-28 LAB — RENAL FUNCTION PANEL
Albumin: 3.5
Albumin: 3.5
BUN: 80 — ABNORMAL HIGH
BUN: 98 — ABNORMAL HIGH
CO2: 21
CO2: 23
Calcium: 8.3 — ABNORMAL LOW
Calcium: 8.4
Chloride: 103
Chloride: 97
Creatinine, Ser: 10.28 — ABNORMAL HIGH
Creatinine, Ser: 6.58 — ABNORMAL HIGH
GFR calc Af Amer: 5 — ABNORMAL LOW
GFR calc Af Amer: 8 — ABNORMAL LOW
GFR calc non Af Amer: 4 — ABNORMAL LOW
GFR calc non Af Amer: 7 — ABNORMAL LOW
Glucose, Bld: 144 — ABNORMAL HIGH
Glucose, Bld: 176 — ABNORMAL HIGH
Phosphorus: 4
Phosphorus: 5.4 — ABNORMAL HIGH
Potassium: 3.6
Potassium: 3.6
Sodium: 133 — ABNORMAL LOW
Sodium: 136

## 2011-08-28 LAB — HEPATIC FUNCTION PANEL
ALT: 31
AST: 30
Albumin: 3.5
Alkaline Phosphatase: 44
Bilirubin, Direct: 0.1
Indirect Bilirubin: 0.7
Total Bilirubin: 0.8
Total Protein: 7.8

## 2011-08-28 LAB — RETICULOCYTES
RBC.: 3.5 — ABNORMAL LOW
Retic Count, Absolute: 28
Retic Ct Pct: 0.8

## 2011-08-28 LAB — VITAMIN B12: Vitamin B-12: >2000 — ABNORMAL HIGH (ref 211–911)

## 2011-08-28 LAB — HEMOGLOBIN A1C
Hgb A1c MFr Bld: 6.1
Mean Plasma Glucose: 128

## 2011-08-28 LAB — TSH: TSH: 3.228

## 2011-08-28 LAB — FOLATE: Folate: 12.7

## 2011-08-28 LAB — AMYLASE, RANDOM URINE: Amylase, Ur: 152

## 2011-08-30 ENCOUNTER — Ambulatory Visit (HOSPITAL_COMMUNITY): Payer: BC Managed Care – PPO | Attending: Cardiology | Admitting: Radiology

## 2011-08-30 ENCOUNTER — Ambulatory Visit (HOSPITAL_BASED_OUTPATIENT_CLINIC_OR_DEPARTMENT_OTHER): Payer: BC Managed Care – PPO

## 2011-08-30 DIAGNOSIS — E119 Type 2 diabetes mellitus without complications: Secondary | ICD-10-CM

## 2011-08-30 DIAGNOSIS — I472 Ventricular tachycardia: Secondary | ICD-10-CM

## 2011-08-30 DIAGNOSIS — R0989 Other specified symptoms and signs involving the circulatory and respiratory systems: Secondary | ICD-10-CM

## 2011-08-30 DIAGNOSIS — R079 Chest pain, unspecified: Secondary | ICD-10-CM

## 2011-08-30 DIAGNOSIS — R0602 Shortness of breath: Secondary | ICD-10-CM

## 2011-08-30 DIAGNOSIS — R0609 Other forms of dyspnea: Secondary | ICD-10-CM

## 2011-08-30 DIAGNOSIS — I10 Essential (primary) hypertension: Secondary | ICD-10-CM

## 2011-08-30 LAB — COMPREHENSIVE METABOLIC PANEL
ALT: 52 — ABNORMAL HIGH
AST: 45 — ABNORMAL HIGH
Albumin: 3.8
Alkaline Phosphatase: 50
BUN: 10
CO2: 30
Calcium: 8.9
Chloride: 97
Creatinine, Ser: 0.92
GFR calc Af Amer: >60
GFR calc non Af Amer: >60
Glucose, Bld: 104 — ABNORMAL HIGH
Potassium: 3.1 — ABNORMAL LOW
Sodium: 137
Total Bilirubin: 0.8
Total Protein: 7.3

## 2011-08-30 LAB — DIFFERENTIAL
Basophils Absolute: 0
Basophils Relative: 0
Eosinophils Absolute: 0.3
Eosinophils Relative: 5
Lymphocytes Relative: 22
Lymphs Abs: 1.6
Monocytes Absolute: 0.4
Monocytes Relative: 6
Neutro Abs: 4.8
Neutrophils Relative %: 67

## 2011-08-30 LAB — CBC
HCT: 36.4
Hemoglobin: 11.8 — ABNORMAL LOW
MCHC: 32.3
MCV: 85.6
Platelets: 270
RBC: 4.25
RDW: 15.1
WBC: 7.2

## 2011-08-30 LAB — URINALYSIS, ROUTINE W REFLEX MICROSCOPIC
Bilirubin Urine: NEGATIVE
Glucose, UA: NEGATIVE
Hgb urine dipstick: NEGATIVE
Ketones, ur: NEGATIVE
Leukocytes, UA: NEGATIVE
Nitrite: NEGATIVE
Protein, ur: 30 — AB
Specific Gravity, Urine: 1.011
Urobilinogen, UA: 0.2
pH: 5

## 2011-08-30 LAB — URINE MICROSCOPIC-ADD ON

## 2011-08-30 LAB — OCCULT BLOOD X 1 CARD TO LAB, STOOL: Fecal Occult Bld: NEGATIVE

## 2011-08-30 LAB — LIPASE, BLOOD: Lipase: 25

## 2011-08-30 MED ORDER — TECHNETIUM TC 99M TETROFOSMIN IV KIT
33.0000 | PACK | Freq: Once | INTRAVENOUS | Status: AC | PRN
  Administered 2011-08-30: 33 via INTRAVENOUS

## 2011-08-30 MED ORDER — TECHNETIUM TC 99M TETROFOSMIN IV KIT
11.0000 | PACK | Freq: Once | INTRAVENOUS | Status: AC | PRN
  Administered 2011-08-30: 11 via INTRAVENOUS

## 2011-08-30 NOTE — Progress Notes (Signed)
[facility] NUCLEAR MED [place] [address] [phone]  Cardiology Nuclear Med Study  Jody Nguyen is a 55 y.o. female [id number] September 15, 1956   Nuclear Med Background Indication for Stress Test:  Evaluation for Ischemia History:  No previous documented CAD Cardiac Risk Factors: CVA, Family History - CAD, Hypertension, IDDM Type 2, Lipids and Obesity  Symptoms:  Chest Pain (last episode of chest discomfort was about 2-weeks ago), Diaphoresis, DOE and Rapid HR   Nuclear Pre-Procedure Caffeine/Decaff Intake:  None NPO After: 7:00pm   Lungs:  Clear. IV 0.9% NS with Angio Cath:  20g  IV Site: R Antecubital x 1, tolerated well IV Started by:  Irven Baltimore, RN  Chest Size (in):  40 Cup Size: D  Height: 4' 11.5" (1.511 m)  Weight:  197 lb (89.359 kg)  BMI:  Body mass index is 39.12 kg/(m^2). Tech Comments:  No medications taken today, per patient    Nuclear Med Study 1 or 2 day study: 1 day  Stress Test Type:  Stress  Reading MD: Mertie Moores, MD  Order Authorizing Provider:  [name], MD  Resting Radionuclide: Technetium 68m Tetrofosmin  Resting Radionuclide Dose: 11.0 mCi   Stress Radionuclide:  Technetium 76m Tetrofosmin  Stress Radionuclide Dose: 33.0 mCi           Stress Protocol Rest HR: 74 Stress HR: 151  Rest BP: 164/97 Stress BP: >223/79  Exercise Time (min): 4:30 METS: 4.6   Predicted Max HR: 164 bpm % Max HR: 92.07 bpm Rate Pressure Product: 33673   Dose of Adenosine (mg):  n/a Dose of Lexiscan: n/a mg  Dose of Atropine (mg): n/a Dose of Dobutamine: n/a mcg/kg/min (at max HR)  Stress Test Technologist: Letta Moynahan, CMA-N  Nuclear Technologist:  Charlton Amor, CNMT     Rest Procedure:  Myocardial perfusion imaging was performed at rest 45 minutes following the intravenous administration of Technetium 75m Tetrofosmin.  Rest ECG: Nonspecific T-wave changes, 1st degree AVB and frequent PVC's.  Stress  Procedure:  The patient exercised for 4:30 on the treadmill utilizing the Bruce protocol.  The patient stopped due to SVT.  She did c/o chest tightness with the SVT, 8/10; that resolved immediately post exercise with normal sinus rhythm.  There were also occasional PVC's.  There were no diagnostic ST-T wave changes.  She did have a hypertensive response to exercise, >223/79 at peak exercise.  Technetium 63m Tetrofosmin was injected at peak exercise and myocardial perfusion imaging was performed after a brief delay.  Stress ECG: No significant change from baseline ECG  QPS Raw Data Images:  Normal; no motion artifact; normal heart/lung ratio. Stress Images:  There is mild apical thinning with normal uptake in other regions. Rest Images:  There is mild apical thinning with normal uptake in other regions. Subtraction (SDS):  No evidence of ischemia. Transient Ischemic Dilatation (Normal <1.22):  1.04 Lung/Heart Ratio (Normal <0.45):  0.36  Quantitative Gated Spect Images QGS EDV:  143 ml QGS ESV:  73 ml QGS cine images:  The LV is moderately dilated.  The LV function is normal.  The LV function appears to be better than the 49% calculated by the computer ( the contour lines do not follow the edges of the LV endocardium exactly) QGS EF: 49%  Impression Exercise Capacity:  Fair exercise capacity. BP Response:  Hypertensive blood pressure response. Clinical Symptoms:  No chest pain. ECG Impression:  No significant ST segment change suggestive  of ischemia. Comparison with Prior Nuclear Study: No previous nuclear study performed  Overall Impression:  Low risk stress nuclear study.  There is no evidence of ischemia.  The LV is dilated but the LV function is normal.  EF = 49%. She did have a hypertensive response to exercise.     Thayer Headings, Brooke Bonito., MD, [facility]     .

## 2011-08-31 ENCOUNTER — Encounter: Payer: Self-pay | Admitting: Cardiology

## 2011-09-01 ENCOUNTER — Telehealth: Payer: Self-pay | Admitting: Cardiovascular Disease

## 2011-09-01 NOTE — Telephone Encounter (Signed)
Stress faxed to [facility] [phone]  09/01/11/km

## 2011-09-22 ENCOUNTER — Emergency Department (HOSPITAL_COMMUNITY): Payer: BC Managed Care – PPO

## 2011-09-22 ENCOUNTER — Emergency Department (HOSPITAL_COMMUNITY)
Admission: EM | Admit: 2011-09-22 | Discharge: 2011-09-22 | Disposition: A | Discharge status: Home / Self Care | Payer: BC Managed Care – PPO | Attending: Emergency Medicine | Admitting: Emergency Medicine

## 2011-09-22 DIAGNOSIS — Z79899 Other long term (current) drug therapy: Secondary | ICD-10-CM | POA: Insufficient documentation

## 2011-09-22 DIAGNOSIS — Z8673 Personal history of transient ischemic attack (TIA), and cerebral infarction without residual deficits: Secondary | ICD-10-CM | POA: Insufficient documentation

## 2011-09-22 DIAGNOSIS — I10 Essential (primary) hypertension: Secondary | ICD-10-CM | POA: Insufficient documentation

## 2011-09-22 DIAGNOSIS — E785 Hyperlipidemia, unspecified: Secondary | ICD-10-CM | POA: Insufficient documentation

## 2011-09-22 DIAGNOSIS — E119 Type 2 diabetes mellitus without complications: Secondary | ICD-10-CM | POA: Insufficient documentation

## 2011-09-22 LAB — URINALYSIS, ROUTINE W REFLEX MICROSCOPIC
Bilirubin Urine: NEGATIVE
Glucose, UA: NEGATIVE mg/dL
Hgb urine dipstick: NEGATIVE
Ketones, ur: NEGATIVE mg/dL
Leukocytes, UA: NEGATIVE
Nitrite: NEGATIVE
Protein, ur: 100 mg/dL — AB
Specific Gravity, Urine: 1.011 (ref 1.005–1.030)
Urobilinogen, UA: 0.2 mg/dL (ref 0.0–1.0)
pH: 6 (ref 5.0–8.0)

## 2011-09-22 LAB — BASIC METABOLIC PANEL
BUN: 19 mg/dL (ref 6–23)
CO2: 31 mEq/L (ref 19–32)
Calcium: 10.4 mg/dL (ref 8.4–10.5)
Chloride: 102 mEq/L (ref 96–112)
Creatinine, Ser: 1.1 mg/dL (ref 0.50–1.10)
GFR calc Af Amer: 64 mL/min — ABNORMAL LOW (ref >90)
GFR calc non Af Amer: 55 mL/min — ABNORMAL LOW (ref >90)
Glucose, Bld: 110 mg/dL — ABNORMAL HIGH (ref 70–99)
Potassium: 3.1 mEq/L — ABNORMAL LOW (ref 3.5–5.1)
Sodium: 142 mEq/L (ref 135–145)

## 2011-09-22 LAB — DIFFERENTIAL
Basophils Absolute: 0 10*3/uL (ref 0.0–0.1)
Basophils Relative: 1 % (ref 0–1)
Eosinophils Absolute: 0.4 10*3/uL (ref 0.0–0.7)
Eosinophils Relative: 7 % — ABNORMAL HIGH (ref 0–5)
Lymphocytes Relative: 38 % (ref 12–46)
Lymphs Abs: 2 10*3/uL (ref 0.7–4.0)
Monocytes Absolute: 0.4 10*3/uL (ref 0.1–1.0)
Monocytes Relative: 7 % (ref 3–12)
Neutro Abs: 2.6 10*3/uL (ref 1.7–7.7)
Neutrophils Relative %: 48 % (ref 43–77)

## 2011-09-22 LAB — CBC
HCT: 34.8 % — ABNORMAL LOW (ref 36.0–46.0)
Hemoglobin: 11.2 g/dL — ABNORMAL LOW (ref 12.0–15.0)
MCH: 26.1 pg (ref 26.0–34.0)
MCHC: 32.2 g/dL (ref 30.0–36.0)
MCV: 81.1 fL (ref 78.0–100.0)
Platelets: 261 10*3/uL (ref 150–400)
RBC: 4.29 MIL/uL (ref 3.87–5.11)
RDW: 14.4 % (ref 11.5–15.5)
WBC: 5.3 10*3/uL (ref 4.0–10.5)

## 2011-09-22 LAB — POCT I-STAT TROPONIN I: Troponin i, poc: 0 ng/mL (ref 0.00–0.08)

## 2011-09-22 LAB — URINE MICROSCOPIC-ADD ON

## 2012-01-19 LAB — HM COLONOSCOPY

## 2012-02-13 ENCOUNTER — Other Ambulatory Visit (HOSPITAL_COMMUNITY): Payer: Self-pay | Admitting: Internal Medicine

## 2012-02-13 DIAGNOSIS — Z1231 Encounter for screening mammogram for malignant neoplasm of breast: Secondary | ICD-10-CM

## 2012-02-26 ENCOUNTER — Telehealth: Payer: Self-pay | Admitting: Oncology

## 2012-02-26 NOTE — Telephone Encounter (Signed)
called pt lmovm to rtn call to schedule appt 

## 2012-02-28 ENCOUNTER — Telehealth: Payer: Self-pay | Admitting: Oncology

## 2012-02-28 NOTE — Telephone Encounter (Signed)
pt rtn call and scheduled appt for 04/08.  will fax over a letter to Dr. Melford Aase with appt d/t

## 2012-03-01 ENCOUNTER — Encounter: Payer: Self-pay | Admitting: Oncology

## 2012-03-01 DIAGNOSIS — D631 Anemia in chronic kidney disease: Secondary | ICD-10-CM | POA: Insufficient documentation

## 2012-03-04 ENCOUNTER — Telehealth: Payer: Self-pay | Admitting: Oncology

## 2012-03-04 ENCOUNTER — Other Ambulatory Visit: Payer: BC Managed Care – PPO | Admitting: Lab

## 2012-03-04 ENCOUNTER — Ambulatory Visit: Payer: BC Managed Care – PPO

## 2012-03-04 ENCOUNTER — Encounter: Payer: Self-pay | Admitting: Oncology

## 2012-03-04 ENCOUNTER — Ambulatory Visit (HOSPITAL_BASED_OUTPATIENT_CLINIC_OR_DEPARTMENT_OTHER): Payer: BC Managed Care – PPO | Admitting: Oncology

## 2012-03-04 VITALS — BP 206/118 | HR 95 | Temp 98.5°F | Ht 59.5 in | Wt 198.1 lb

## 2012-03-04 DIAGNOSIS — D649 Anemia, unspecified: Secondary | ICD-10-CM

## 2012-03-04 DIAGNOSIS — N289 Disorder of kidney and ureter, unspecified: Secondary | ICD-10-CM

## 2012-03-04 LAB — CHCC SMEAR

## 2012-03-04 LAB — CBC & DIFF AND RETIC
BASO%: 0.6 % (ref 0.0–2.0)
Basophils Absolute: 0 10*3/uL (ref 0.0–0.1)
EOS%: 6.3 % (ref 0.0–7.0)
Eosinophils Absolute: 0.3 10*3/uL (ref 0.0–0.5)
HCT: 33.7 % — ABNORMAL LOW (ref 34.8–46.6)
HGB: 10.7 g/dL — ABNORMAL LOW (ref 11.6–15.9)
Immature Retic Fract: 13.6 % — ABNORMAL HIGH (ref 1.60–10.00)
LYMPH%: 35.9 % (ref 14.0–49.7)
MCH: 25.1 pg (ref 25.1–34.0)
MCHC: 31.8 g/dL (ref 31.5–36.0)
MCV: 78.9 fL — ABNORMAL LOW (ref 79.5–101.0)
MONO#: 0.3 10*3/uL (ref 0.1–0.9)
MONO%: 5.4 % (ref 0.0–14.0)
NEUT#: 2.4 10*3/uL (ref 1.5–6.5)
NEUT%: 51.8 % (ref 38.4–76.8)
Platelets: 254 10*3/uL (ref 145–400)
RBC: 4.27 10*6/uL (ref 3.70–5.45)
RDW: 15.9 % — ABNORMAL HIGH (ref 11.2–14.5)
Retic %: 2.27 % — ABNORMAL HIGH (ref 0.70–2.10)
Retic Ct Abs: 96.93 10*3/uL — ABNORMAL HIGH (ref 33.70–90.70)
WBC: 4.6 10*3/uL (ref 3.9–10.3)
lymph#: 1.7 10*3/uL (ref 0.9–3.3)

## 2012-03-04 LAB — MORPHOLOGY: PLT EST: ADEQUATE

## 2012-03-04 NOTE — Progress Notes (Signed)
Patient came in this morning as a new patient she has [place] also stated that she is not working ,so I explain to her our financial assistance program and also our co-pay assistance,I gave her an epp application to take home and fill out and return it to me.

## 2012-03-04 NOTE — Progress Notes (Signed)
[place] INITIAL HEMATOLOGY CONSULTATION  Referral MD:  Dr. Unk Pinto, M.D.  Reason for Referral:  Anemia.     HPI:  Jody Nguyen is a 56 year-old [place] American with PMH notable for obesity, DM-type II, HTN, HLP, CKD.  I personally reviewed the extensive past medical history record.  Oldest available CBC dated 05/03/2010 which showed WBC 5.4; Hgb 11.7; Plt 212.  The latest CBC dated 02/15/2012 showed WBC 4.9; Hgb 10.4; Plt 273.  On that date, Cr was 1.28.  Anemia work up showed TSH 2.349; Vit B12 was 652, iron 50; ferritin 128.  She was referred to Dr. Collene Mares for anemia.  On 01/19/2012, her EGD showed moderate diffuse gastritis.   Small bowel biopsy was done to rule out sprue.  Colonoscopy showed isolated diverticulum.  Given her anemia, she was kindly referred to the [facility] for evaluation.  [name] presented to the clinic by herself today.  She has had mild SOB and DOE on heavy exertion.  She denies PND, orthopnea, pedal edema, chest pain, palpitation.  With respect to anemia, she denies all visible source of bleeding.  She has good appetite and has been trying to lose weight.  She has lost a few pounds compared to last year.    Patient denies fatigue, headache, visual changes, confusion, drenching night sweats, palpable lymph node swelling, mucositis, odynophagia, dysphagia, nausea vomiting, jaundice, productive cough, gum bleeding, epistaxis, hematemesis, hemoptysis, abdominal pain, abdominal swelling, early satiety, melena, hematochezia, hematuria, skin rash, spontaneous bleeding, joint swelling, joint pain, heat or cold intolerance, bowel bladder incontinence, back pain, focal motor weakness, paresthesia, depression, suicidal or homocidal ideation, feeling hopelessness.     Past Medical History  Diagnosis Date  . Obesity   . Elevated cholesterol   . Vitamin d deficiency   . DM (diabetes mellitus)   . Hypertension   . CVA (cerebral infarction) 1997    no  residual deficit  . Anemia   . Renal insufficiency   :    Past Surgical History  Procedure Date  . Cesarean section     x 2  :   CURRENT MEDS: Current Outpatient Prescriptions  Medication Sig Dispense Refill  . aspirin 81 MG tablet Take 81 mg by mouth daily.        . Cholecalciferol (VITAMIN D-3) 5000 UNITS TABS Take 1 tablet by mouth daily.      . cloNIDine (CATAPRES) 0.2 MG tablet Take 1 tablet by mouth Twice daily.      . furosemide (LASIX) 80 MG tablet Take 80 mg by mouth 3 (three) times daily.      . Iron TABS Take 27 mg by mouth 2 (two) times daily.      Marland Kitchen labetalol (NORMODYNE) 100 MG tablet Take 100 mg by mouth 2 (two) times daily.      . Magnesium 250 MG TABS Take 1 tablet by mouth daily.      . metFORMIN (GLUCOPHAGE) 1000 MG tablet Take 1 tablet by mouth Twice daily.      . minoxidil (LONITEN) 10 MG tablet Take 1 tablet by mouth Daily.      Marland Kitchen olmesartan (BENICAR) 40 MG tablet Take 40 mg by mouth daily.      . potassium chloride SA (K-DUR,KLOR-CON) 20 MEQ tablet Take 20 mEq by mouth daily.        . rosuvastatin (CRESTOR) 20 MG tablet Take 20 mg by mouth daily.        Marland Kitchen amLODipine (NORVASC)  10 MG tablet Take 10 mg by mouth daily.            No Known Allergies:  Family History  Problem Relation Age of Onset  . Heart attack Brother     MI in his 28s  . Cancer Sister     breast cancer  . Breast cancer Sister   . Stroke Mother   :  History   Social History  . Marital Status: Married    Spouse Name: N/A    Number of Children: 3  . Years of Education: N/A   Occupational History  .      Unemployed; used to work as a Radio broadcast assistant   Social History Main Topics  . Smoking status: Never Smoker   . Smokeless tobacco: Not on file  . Alcohol Use: No  . Drug Use: No  . Sexually Active: Yes    Birth Control/ Protection: None   Other Topics Concern  . Not on file   Social History Narrative  . No narrative on file  :  REVIEW OF SYSTEM:  The  rest of the 14-point review of sytem was negative.   Exam:  General:  Mildly obese woman, in no acute distress.  Eyes:  no scleral icterus.  ENT:  There were no oropharyngeal lesions.  She has narrow airway from extra tissue.  Neck was without thyromegaly.  Lymphatics:  Negative cervical, supraclavicular or axillary adenopathy.  Respiratory: lungs were clear bilaterally without wheezing or crackles.  Cardiovascular:  Regular rate and rhythm, S1/S2, without murmur, rub or gallop.  There was no pedal edema.  GI:  abdomen was soft, flat, nontender, nondistended, without organomegaly.  Muscoloskeletal:  no spinal tenderness of palpation of vertebral spine.  Skin exam was without echymosis, petichae.  Neuro exam was nonfocal.  Patient was able to get on and off exam table without assistance.  Gait was normal.  Patient was alerted and oriented.  Attention was good.   Language was appropriate.  Mood was normal without depression.  Speech was not pressured.  Thought content was not tangential.    LABS:  Lab Results  Component Value Date   WBC 5.3 09/22/2011   HGB 11.2* 09/22/2011   HCT 34.8* 09/22/2011   PLT 261 09/22/2011   GLUCOSE 110* 09/22/2011   CHOL  Value: 125        ATP III CLASSIFICATION:  <200     mg/dL   Desirable  200-239  mg/dL   Borderline High  >=240    mg/dL   High 08/13/2008   TRIG 131 08/13/2008   HDL 31* 08/13/2008   LDLCALC  Value: 68        Total Cholesterol/HDL:CHD Risk Coronary Heart Disease Risk Table                     Men   Women  1/2 Average Risk   3.4   3.3 08/13/2008   ALT 48* 07/24/2011   AST 34 07/24/2011   NA 142 09/22/2011   K 3.1* 09/22/2011   CL 102 09/22/2011   CREATININE 1.10 09/22/2011   BUN 19 09/22/2011   CO2 31 09/22/2011   HGBA1C  Value: 6.1 (NOTE)   The ADA recommends the following therapeutic goal for glycemic   control related to Hgb A1C measurement:   Goal of Therapy:   < 7.0% Hgb A1C   Reference: American Diabetes Association: Clinical Practice    Recommendations 2008, Diabetes Care,  2008,  31:(Suppl 1). 08/13/2008      Blood smear review:   I personally reviewed the patient's peripheral blood smear today.  There was isocytosis.  There was no peripheral blast.  There was no schistocytosis, spherocytosis, target cell, rouleaux formation, tear drop cell.  There was no giant platelets or platelet clumps.      ASSESSMENT AND PLAN:   1.  Type II diabetes mellitus:  She is on metformin per PCP.  2.  Hypertension:  She did not take her meds this morning before visit; and she claimed to be anxious.  She is on Clonidine, Lasix, Labetalol, Benicar, Norvasc.  I advised her to follow up with her PCP to see if she needs further BP meds titration as appropriate.   3.  Hyperlipidemia:  She is on Crestor per PCP.  4.  Obesity:  I advised her to talk with her PCP to see whether a repeat sleep apnea study is indicated.  She said that when it was done last, she did not feel comforatble with the result that she did not have OSA.  On my exam today, given her body habitus and oropharynx exam, she is at high risk of OSA.  If this is the diagnosis, treating her with CPAP may help improve her obesity.  5.  Chronic kidney disease:  Most likely due to DM and HTN.  I sent for 24-hour urine collection today to assess her renal function.  6.  CVA:  She is on ASA, statin, and multiple BP meds.  7.  SOB:  Most likely due to obesity, deconditioning.  However, I advised her that if her symptoms persist, she may consider asking her PCP to see if a stress test is indicated.   8.  Chronic normocytic anemia:  Negative GI work up. - Differential diagnosis:  Anemia from chronic kidney disease is the most likely diagnosis.  I need to rule out myeloma given anemia and renal insufficiency.  I have low clinical suspicion for myelodysplastic syndrome as my review of her blood smear today was benign and her solitary anemia has been quite stable without pancytopenia.   - Work  up:  I sent for  24 hour urine collection,  SPEP/serum free light chain.  A diagnostic bone marrow biopsy at this time has low clinical utility given very mild anemia. - Treatment: control BP, and DM.  No role for hem intervention such mild anemia.  In the future, if she develops worsened anemia with negative work up, I may consider pRBC transfusion for Hgb <8.  If she requires frequent pRBC transfusion, I may consider Aranesp injection; however, given her history of CVA, I would like to delay this as long as possible.   9.  Follow up:  Lab only appointment in about 2 and 4 months for stability of her Hgb.  She has follow up appointment in about 6 months.    The length of time of the face-to-face encounter was 30 minutes. More than 50% of time was spent counseling and coordination of care.

## 2012-03-04 NOTE — Patient Instructions (Signed)
1.  Diagnosis:  Mild anemia. - Work up:  Pending. - Most likely anemia of chronic kidney disease. - We may consider more work up in the future such as a bone marrow biopsy if your anemia continues to worsen without apparent explanation.   2.  Follow up:  - Lab-only appointment in about 2 and then 4 months. - Follow up with Dr. Agustina Caroli nurse practitioner in about 6 months.

## 2012-03-04 NOTE — Telephone Encounter (Signed)
Gv pt appt for [id number]

## 2012-03-04 NOTE — Telephone Encounter (Signed)
Referred by Dr. Unk Pinto Dx- Anemia

## 2012-03-06 ENCOUNTER — Ambulatory Visit: Payer: BC Managed Care – PPO | Admitting: Lab

## 2012-03-06 DIAGNOSIS — D649 Anemia, unspecified: Secondary | ICD-10-CM

## 2012-03-06 DIAGNOSIS — N289 Disorder of kidney and ureter, unspecified: Secondary | ICD-10-CM

## 2012-03-06 LAB — PROTEIN ELECTROPHORESIS, SERUM
Albumin ELP: 52.5 % — ABNORMAL LOW (ref 55.8–66.1)
Alpha-1-Globulin: 4.2 % (ref 2.9–4.9)
Alpha-2-Globulin: 10.7 % (ref 7.1–11.8)
Beta 2: 5.9 % (ref 3.2–6.5)
Beta Globulin: 5.6 % (ref 4.7–7.2)
Gamma Globulin: 21.1 % — ABNORMAL HIGH (ref 11.1–18.8)
Total Protein, Serum Electrophoresis: 7.3 g/dL (ref 6.0–8.3)

## 2012-03-06 LAB — CBC WITH DIFFERENTIAL/PLATELET
BASO%: 0.6 % (ref 0.0–2.0)
Basophils Absolute: 0 10*3/uL (ref 0.0–0.1)
EOS%: 6.4 % (ref 0.0–7.0)
Eosinophils Absolute: 0.3 10*3/uL (ref 0.0–0.5)
HCT: 33.1 % — ABNORMAL LOW (ref 34.8–46.6)
HGB: 10.7 g/dL — ABNORMAL LOW (ref 11.6–15.9)
LYMPH%: 27.6 % (ref 14.0–49.7)
MCH: 26 pg (ref 25.1–34.0)
MCHC: 32.3 g/dL (ref 31.5–36.0)
MCV: 80.6 fL (ref 79.5–101.0)
MONO#: 0.4 10*3/uL (ref 0.1–0.9)
MONO%: 7.9 % (ref 0.0–14.0)
NEUT#: 3.1 10*3/uL (ref 1.5–6.5)
NEUT%: 57.5 % (ref 38.4–76.8)
Platelets: 243 10*3/uL (ref 145–400)
RBC: 4.1 10*6/uL (ref 3.70–5.45)
RDW: 16 % — ABNORMAL HIGH (ref 11.2–14.5)
WBC: 5.4 10*3/uL (ref 3.9–10.3)
lymph#: 1.5 10*3/uL (ref 0.9–3.3)
nRBC: 0 % (ref 0–0)

## 2012-03-06 LAB — COMPREHENSIVE METABOLIC PANEL
ALT: 17 U/L (ref 0–35)
AST: 20 U/L (ref 0–37)
Albumin: 4.1 g/dL (ref 3.5–5.2)
Alkaline Phosphatase: 60 U/L (ref 39–117)
BUN: 18 mg/dL (ref 6–23)
CO2: 30 mEq/L (ref 19–32)
Calcium: 9.7 mg/dL (ref 8.4–10.5)
Chloride: 103 mEq/L (ref 96–112)
Creatinine, Ser: 1.43 mg/dL — ABNORMAL HIGH (ref 0.50–1.10)
Glucose, Bld: 133 mg/dL — ABNORMAL HIGH (ref 70–99)
Potassium: 3.6 mEq/L (ref 3.5–5.3)
Sodium: 142 mEq/L (ref 135–145)
Total Bilirubin: 0.4 mg/dL (ref 0.3–1.2)
Total Protein: 7.3 g/dL (ref 6.0–8.3)

## 2012-03-06 LAB — ERYTHROPOIETIN: Erythropoietin: 61 m[IU]/mL — ABNORMAL HIGH (ref 2.6–34.0)

## 2012-03-06 LAB — LACTATE DEHYDROGENASE: LDH: 218 U/L (ref 94–250)

## 2012-03-06 LAB — KAPPA/LAMBDA LIGHT CHAINS
Kappa free light chain: 5.98 mg/dL — ABNORMAL HIGH (ref 0.33–1.94)
Kappa:Lambda Ratio: 1.99 — ABNORMAL HIGH (ref 0.26–1.65)
Lambda Free Lght Chn: 3.01 mg/dL — ABNORMAL HIGH (ref 0.57–2.63)

## 2012-03-07 ENCOUNTER — Ambulatory Visit (HOSPITAL_COMMUNITY)
Admission: RE | Admit: 2012-03-07 | Discharge: 2012-03-07 | Disposition: A | Discharge status: Home / Self Care | Payer: BC Managed Care – PPO | Source: Ambulatory Visit | Attending: Internal Medicine | Admitting: Internal Medicine

## 2012-03-07 DIAGNOSIS — Z1231 Encounter for screening mammogram for malignant neoplasm of breast: Secondary | ICD-10-CM | POA: Insufficient documentation

## 2012-03-07 LAB — CREATININE CLEARANCE, URINE, 24 HOUR
Collection Interval-CRCL: 24 hours
Creatinine Clearance: 90 mL/min (ref 75–115)
Creatinine, 24H Ur: 1491 mg/d (ref 700–1800)
Creatinine, Urine: 62.1 mg/dL
Creatinine: 1.15 mg/dL — ABNORMAL HIGH (ref 0.50–1.10)
Urine Total Volume-CRCL: 2400 mL

## 2012-03-08 LAB — UIFE/LIGHT CHAINS/TP QN, 24-HR UR
Albumin, U: DETECTED
Alpha 1, Urine: DETECTED — AB
Alpha 2, Urine: DETECTED — AB
Beta, Urine: DETECTED — AB
Free Kappa Lt Chains,Ur: 17.9 mg/dL — ABNORMAL HIGH (ref 0.14–2.42)
Free Kappa/Lambda Ratio: 9.42 ratio (ref 2.04–10.37)
Free Lambda Excretion/Day: 45.6 mg/d
Free Lambda Lt Chains,Ur: 1.9 mg/dL — ABNORMAL HIGH (ref 0.02–0.67)
Free Lt Chn Excr Rate: 429.6 mg/d
Gamma Globulin, Urine: DETECTED — AB
Time: 24 hours
Total Protein, Urine-Ur/day: 2662 mg/d — ABNORMAL HIGH (ref 10–140)
Total Protein, Urine: 110.9 mg/dL
Volume, Urine: 2400 mL

## 2012-03-15 ENCOUNTER — Emergency Department (HOSPITAL_COMMUNITY)
Admission: EM | Admit: 2012-03-15 | Discharge: 2012-03-15 | Disposition: A | Discharge status: Home Health | Payer: BC Managed Care – PPO | Attending: Emergency Medicine | Admitting: Emergency Medicine

## 2012-03-15 ENCOUNTER — Encounter (HOSPITAL_COMMUNITY): Payer: Self-pay | Admitting: Physical Medicine and Rehabilitation

## 2012-03-15 DIAGNOSIS — Z7982 Long term (current) use of aspirin: Secondary | ICD-10-CM | POA: Insufficient documentation

## 2012-03-15 DIAGNOSIS — Z79899 Other long term (current) drug therapy: Secondary | ICD-10-CM | POA: Insufficient documentation

## 2012-03-15 DIAGNOSIS — Z8673 Personal history of transient ischemic attack (TIA), and cerebral infarction without residual deficits: Secondary | ICD-10-CM | POA: Insufficient documentation

## 2012-03-15 DIAGNOSIS — E119 Type 2 diabetes mellitus without complications: Secondary | ICD-10-CM | POA: Insufficient documentation

## 2012-03-15 DIAGNOSIS — E669 Obesity, unspecified: Secondary | ICD-10-CM | POA: Insufficient documentation

## 2012-03-15 DIAGNOSIS — N289 Disorder of kidney and ureter, unspecified: Secondary | ICD-10-CM | POA: Insufficient documentation

## 2012-03-15 DIAGNOSIS — E559 Vitamin D deficiency, unspecified: Secondary | ICD-10-CM | POA: Insufficient documentation

## 2012-03-15 DIAGNOSIS — E7800 Pure hypercholesterolemia, unspecified: Secondary | ICD-10-CM | POA: Insufficient documentation

## 2012-03-15 DIAGNOSIS — R21 Rash and other nonspecific skin eruption: Secondary | ICD-10-CM

## 2012-03-15 DIAGNOSIS — Z794 Long term (current) use of insulin: Secondary | ICD-10-CM | POA: Insufficient documentation

## 2012-03-15 DIAGNOSIS — I10 Essential (primary) hypertension: Secondary | ICD-10-CM | POA: Insufficient documentation

## 2012-03-15 MED ORDER — FAMOTIDINE 20 MG PO TABS
40.0000 mg | ORAL_TABLET | Freq: Once | ORAL | Status: AC
  Administered 2012-03-15: 40 mg via ORAL
  Filled 2012-03-15: qty 2

## 2012-03-15 MED ORDER — DEXAMETHASONE SODIUM PHOSPHATE 10 MG/ML IJ SOLN
10.0000 mg | Freq: Once | INTRAMUSCULAR | Status: AC
  Administered 2012-03-15: 10 mg via INTRAMUSCULAR
  Filled 2012-03-15: qty 1

## 2012-03-15 MED ORDER — PREDNISONE 50 MG PO TABS: 50.0000 mg | ORAL_TABLET | Freq: Every day | ORAL | Status: AC

## 2012-03-15 MED ORDER — FAMOTIDINE 40 MG PO TABS: 40.0000 mg | ORAL_TABLET | Freq: Every day | ORAL | Status: DC

## 2012-03-15 NOTE — ED Notes (Signed)
Pt presents to department for evaluation of rash to bilateral arms and legs x1 week. Pt states severe itching and discomfort. No relief with benadryl at home. Pt states no recent use of new hygiene products. Denies SOB. Pt is conscious alert and oriented x4. No signs of acute distress noted.

## 2012-03-15 NOTE — ED Provider Notes (Signed)
History     CSN: [id number]  Arrival date & time 03/15/12  0900   First MD Initiated Contact with Patient 03/15/12 [phone]      Chief Complaint  Patient presents with  . Rash  . Pruritis    (Consider location/radiation/quality/duration/timing/severity/associated sxs/prior treatment) HPI Patient presents to the ER with complaint of rash that began about 1 week ago. The patient states that the rash is on her arms and legs. The patient states that she has not had any exposure to new soaps, lotions, detergents, chemicals, medications or any environmental possibilities. The patient states that she has taken benadryl with minimal relief of the itching.The patient states that she has not had an SOB, N/V, dizziness, wheezing, fever, diarrhea, or CP.  Past Medical History  Diagnosis Date  . Obesity   . Elevated cholesterol   . Vitamin d deficiency   . DM (diabetes mellitus)   . Hypertension   . CVA (cerebral infarction) 1997    no residual deficit  . Anemia   . Renal insufficiency     Past Surgical History  Procedure Date  . Cesarean section     x 2    Family History  Problem Relation Age of Onset  . Heart attack Brother     MI in his 61s  . Cancer Sister     breast cancer  . Breast cancer Sister   . Stroke Mother     History  Substance Use Topics  . Smoking status: Never Smoker   . Smokeless tobacco: Not on file  . Alcohol Use: No    OB History    Grav Para Term Preterm Abortions TAB SAB Ect Mult Living                  Review of Systems All pertinent positives and negatives reviewed in the history of present illness  Allergies  Review of patient's allergies indicates no known allergies.  Home Medications   Current Outpatient Rx  Name Route Sig Dispense Refill  . ASPIRIN 81 MG PO TABS Oral Take 81 mg by mouth daily.      Marland Kitchen VITAMIN D-3 5000 UNITS PO TABS Oral Take 1 tablet by mouth daily.    Marland Kitchen NEXIUM PO Oral Take 1 capsule by mouth daily.    . FUROSEMIDE  80 MG PO TABS Oral Take 80 mg by mouth 3 (three) times daily.    . INSULIN DETEMIR 100 UNIT/ML [place] SOLN Subcutaneous Inject 13 Units into the skin every morning.    . IRON PO TABS Oral Take 27 mg by mouth 2 (two) times daily.    Marland Kitchen LABETALOL HCL 100 MG PO TABS Oral Take 100 mg by mouth 2 (two) times daily.    Marland Kitchen MAGNESIUM 250 MG PO TABS Oral Take 1 tablet by mouth daily.    Marland Kitchen METFORMIN HCL 1000 MG PO TABS Oral Take 1 tablet by mouth Twice daily.    Marland Kitchen MINOXIDIL 10 MG PO TABS Oral Take 1 tablet by mouth Daily.    Marland Kitchen OLMESARTAN MEDOXOMIL 40 MG PO TABS Oral Take 40 mg by mouth daily.    Marland Kitchen POTASSIUM CHLORIDE CRYS ER 20 MEQ PO TBCR Oral Take 20 mEq by mouth daily.      Marland Kitchen ROSUVASTATIN CALCIUM 20 MG PO TABS Oral Take 20 mg by mouth daily.        There were no vitals taken for this visit.  Physical Exam Physical Examination: General appearance - alert, well appearing,  and in no distress and oriented to person, place, and time Eyes - pupils equal and reactive, extraocular eye movements intact Ears - bilateral TM's and external ear canals normal Nose - normal and patent, no erythema, discharge or polyps Mouth - mucous membranes moist, pharynx normal without lesions Chest - clear to auscultation, no wheezes, rales or rhonchi, symmetric air entry Heart - normal rate, regular rhythm, normal S1, S2, no murmurs, rubs, clicks or gallops Skin - The patient has hive like areas on her upper/lower arms and legs. The areas are red and dome shaped.     ED Course  Procedures (including critical care time) The patient will be treated for what appears to be an allergic type rash. The patient is advised to follow up with her PCP. Told to return here as needed. Told to continue to use benadryl. The patient has been stable here in the ER.  The nursing did not do vitals even after I asked for them. The patient appeared stable and on auscultation she was not tachypnic or tachycardic.     [place], PA-C 03/18/12 [phone]

## 2012-03-15 NOTE — Discharge Instructions (Signed)
REturn here as needed. Take benadryl for itching. Follow up with your doctor.

## 2012-03-18 NOTE — ED Provider Notes (Signed)
Medical screening examination/treatment/procedure(s) were performed by non-physician practitioner and as supervising physician I was immediately available for consultation/collaboration.  Carmin Muskrat, MD 03/18/12 [phone]

## 2012-03-21 ENCOUNTER — Encounter: Payer: Self-pay | Admitting: Physician Assistant

## 2012-03-21 ENCOUNTER — Ambulatory Visit (INDEPENDENT_AMBULATORY_CARE_PROVIDER_SITE_OTHER): Payer: BC Managed Care – PPO | Admitting: Physician Assistant

## 2012-03-21 VITALS — BP 195/101 | HR 102 | Temp 98.7°F | Ht 59.0 in | Wt 200.0 lb

## 2012-03-21 DIAGNOSIS — N8111 Cystocele, midline: Secondary | ICD-10-CM

## 2012-03-21 DIAGNOSIS — Z01419 Encounter for gynecological examination (general) (routine) without abnormal findings: Secondary | ICD-10-CM

## 2012-03-21 DIAGNOSIS — Z124 Encounter for screening for malignant neoplasm of cervix: Secondary | ICD-10-CM

## 2012-03-21 DIAGNOSIS — IMO0002 Reserved for concepts with insufficient information to code with codable children: Secondary | ICD-10-CM

## 2012-03-21 DIAGNOSIS — R32 Unspecified urinary incontinence: Secondary | ICD-10-CM

## 2012-03-21 DIAGNOSIS — I10 Essential (primary) hypertension: Secondary | ICD-10-CM

## 2012-03-21 NOTE — Patient Instructions (Signed)
Patient information: Pelvic muscle (Kegel) exercises (The Basics)  What are pelvic muscle exercises? -- Pelvic muscle exercises are exercises that strengthen the muscles that control the flow of urine and bowel movements. These exercises are also known as "Kegel" exercises. They can help keep you from leaking urine, gas, or bowel movements, if leaks are a problem for you. They can also help with a condition that affects women called "pelvic organ prolapse." In women who have pelvic organ prolapse, the organs in the lower belly drop down and press against or bulge into the vagina.  How do I learn how to do Kegel exercises? -- First ask your doctor or nurse how to do them right. He or she can help you get started.  You will need to learn which muscles to tighten. It is sometimes hard to figure out the right muscles.  A woman might learn to do Kegel exercises by:  Putting a finger inside her vagina and squeezing the muscles around her finger; or  Pretending that she is sitting on a marble and has to pick up the marble using her vagina A man might learn to do Kegels by tightening his butt muscles as if he were holding back gas.  Both men and women can also learn to do Kegel exercises by stopping and starting the flow of urine. If you do this, make sure to do this only once or twice to figure out the correct muscles. Some doctors think you should not do this at all, because if you get in the habit of doing it, it could damage your bladder.  No matter how you learn to do Kegel exercises, the important thing to know is that the muscles involved are not in your belly or your thighs.  After you learn which muscles to tighten, you can do the exercises in any position (sitting in a chair or lying down). You do not need to do them while you are in the bathroom.  How often should I do the exercises? -- Do the exercises 2 to 3 times a day, every day. Each time, flex your muscles about 10 times, and hold them tight for  a few seconds each time you tighten. Keep up this routine for at least 4 or 5 months. You will probably see results, but it might take a little time.  How do Kegel exercises help? -- Kegel exercises can help: Reduce urine leaks in people who have "stress incontinence," which means they leak urine when they cough, laugh, sneeze, or strain  Control sudden urges to urinate that happen to people with "urge incontinence"  Control the release of gas or bowel movements  Reduce pressure or bulging in the vagina caused by pelvic organ prolapse. (If you have a bulge in the vagina, see your doctor or nurse to find out the cause.) Kegel exercises might also reduce urine leaks in men who have had surgery to treat prostate cancer or an enlarged prostate.        Urinary Incontinence Your doctor wants you to have this information about urinary incontinence. This is the inability to keep urine in your body until you decide to release it. CAUSES  Prostate gland enlargement is a common cause of urinary incontinence. But there are many different causes for losing urinary control. They include:  Medicines.   Infections.   Prostate problems.   Surgery.   Neurological diseases.   Emotional factors.  DIAGNOSIS  Evaluating the cause of incontinence is important in choosing  the best treatment. This may require:  An ultrasound exam.   Kidney and bladder X-rays.   Cystoscopy. This is an exam of the bladder using a narrow scope.  TREATMENT  For incontinent patients, normal daily hygiene and using changing pads or adult diapers regularly will prevent offensive odors and skin damage from the moisture. Changing your medicines may help control incontinence. Your caregiver may prescribe some medicines to help you regain control. Avoid caffeine. It can over-stimulate the bladder. Use the bathroom regularly. Try about every 2 to 3 hours even if you do not feel the need. Take time to empty your bladder completely.  After urinating, wait a minute. Then try to urinate again. External devices used to catch urine or an indwelling urine catheter (Foley catheter) may be needed as well. Some prostate gland problems require surgery to correct. Call your caregiver for more information. Document Released: 12/21/2004 Document Revised: 11/02/2011 Document Reviewed: 12/16/2008 [facility] Patient Information 2012 [place], [place].

## 2012-03-21 NOTE — Progress Notes (Signed)
Chief Complaint:  Gynecologic Exam   Jody Nguyen is  56 y.o. G3P3000.  No LMP recorded. Patient is postmenopausal..  She presents complaining of Gynecologic Exam  Pt presents for Pap only. States physical and breast exam last week. Reports taking BP meds as directed. Next visit with PCP: Dr. Williemae Nguyen in June.  Obstetrical/Gynecological History: Pertinent Gynecological History: Menses: post-menopausal Bleeding: none Contraception: post menopausal status Last mammogram: normal Date: 01/2012 Last pap: unknown, no abnormals   Past Medical History: Past Medical History  Diagnosis Date  . Obesity   . Elevated cholesterol   . Vitamin d deficiency   . DM (diabetes mellitus)   . Hypertension   . CVA (cerebral infarction) 1997    no residual deficit  . Anemia   . Renal insufficiency     Past Surgical History: Past Surgical History  Procedure Date  . Cesarean section     x 2    Family History: Family History  Problem Relation Age of Onset  . Heart attack Brother     MI in his 76s  . Cancer Sister     breast cancer  . Breast cancer Sister   . Stroke Mother     Social History: History  Substance Use Topics  . Smoking status: Never Smoker   . Smokeless tobacco: Never Used  . Alcohol Use: No    Allergies: No Known Allergies  Review of Systems - Negative except what has been reviewed in HPI  Physical Exam   Blood pressure 195/101, pulse 102, temperature 98.7 F (37.1 C), temperature source Oral, height 4\' 11"  (1.499 m), weight 200 lb (90.719 kg).  General: General appearance - alert, well appearing, and in no distress, oriented to person, place, and time and overweight Mental status - alert, oriented to person, place, and time, normal mood, behavior, speech, dress, motor activity, and thought processes, affect appropriate to mood Focused Gynecological Exam: VULVA: normal appearing vulva with no masses, tenderness or lesions, VAGINA: normal appearing vagina with  normal color and discharge, no lesions, PELVIC FLOOR EXAM: cystocele Grade I/II, CERVIX: normal appearing cervix without discharge or lesions, UTERUS: uterus is normal size, shape, consistency and nontender, ADNEXA: normal adnexa in size, nontender and no masses  Imaging Studies:  Mm Digital Screening  03/11/2012  DG SCREEN MAMMOGRAM BILATERAL Bilateral CC and MLO view(s) were taken. Technologist: Jody Nguyen, R.T.(R)(M)  DIGITAL SCREENING MAMMOGRAM WITH CAD: There are scattered fibroglandular densities.  No masses or malignant type calcifications are  identified.  Compared with prior studies.  Images were processed with CAD.  IMPRESSION: No specific mammographic evidence of malignancy.  Next screening mammogram is recommended in one  year.  A result letter of this screening mammogram will be mailed directly to the patient.  ASSESSMENT: Negative - BI-RADS 1  Screening mammogram in 1 year. ,     Assessment:   1. Routine Papanicolaou smear  Cytology - PAP  2. Hypertension    3. Cystocele    4. Urinary incontinence        Plan: Pap obtained and sent per routine Call PCP to schedule an appt for re-check of BP. Reviewed importance of med compliance and good BP control. Last 2 visits show  4/8: 206/118,  4/25: 193/105  Instructed on Kegal exercises for mild urinary incontinence  Jody Rodas E. 03/21/2012,2:49 PM

## 2012-05-03 ENCOUNTER — Telehealth: Payer: Self-pay | Admitting: *Deleted

## 2012-05-03 ENCOUNTER — Other Ambulatory Visit (HOSPITAL_BASED_OUTPATIENT_CLINIC_OR_DEPARTMENT_OTHER): Payer: BC Managed Care – PPO | Admitting: Lab

## 2012-05-03 DIAGNOSIS — N289 Disorder of kidney and ureter, unspecified: Secondary | ICD-10-CM

## 2012-05-03 DIAGNOSIS — D649 Anemia, unspecified: Secondary | ICD-10-CM

## 2012-05-03 LAB — CBC WITH DIFFERENTIAL/PLATELET
BASO%: 0.6 % (ref 0.0–2.0)
Basophils Absolute: 0 10*3/uL (ref 0.0–0.1)
EOS%: 4.2 % (ref 0.0–7.0)
Eosinophils Absolute: 0.2 10*3/uL (ref 0.0–0.5)
HCT: 36.6 % (ref 34.8–46.6)
HGB: 11.8 g/dL (ref 11.6–15.9)
LYMPH%: 34.4 % (ref 14.0–49.7)
MCH: 25.7 pg (ref 25.1–34.0)
MCHC: 32.2 g/dL (ref 31.5–36.0)
MCV: 79.7 fL (ref 79.5–101.0)
MONO#: 0.5 10*3/uL (ref 0.1–0.9)
MONO%: 8.3 % (ref 0.0–14.0)
NEUT#: 3.1 10*3/uL (ref 1.5–6.5)
NEUT%: 52.5 % (ref 38.4–76.8)
Platelets: 226 10*3/uL (ref 145–400)
RBC: 4.59 10*6/uL (ref 3.70–5.45)
RDW: 16.6 % — ABNORMAL HIGH (ref 11.2–14.5)
WBC: 5.9 10*3/uL (ref 3.9–10.3)
lymph#: 2 10*3/uL (ref 0.9–3.3)

## 2012-05-03 NOTE — Telephone Encounter (Signed)
Message copied by Randolm Idol on Fri May 03, 2012 10:14 AM ------      Message from: Sherryl Manges T      Created: Fri May 03, 2012 10:12 AM       Please call pt.  Her Hbg now is better.  Anemia has improved.  This anemia is from chronic kidney disease.  Will continue observation.  Thanks.

## 2012-05-07 NOTE — Progress Notes (Signed)
No note

## 2012-05-08 ENCOUNTER — Telehealth: Payer: Self-pay | Admitting: *Deleted

## 2012-05-08 NOTE — Telephone Encounter (Signed)
Pt left VM returning call from 6/07 regarding her lab work.  I called pt back and left VM informing her anemia is improved per Dr. Lamonte Sakai and to continue observation. Instructed to call us back if she wanted more detailed information or any questions.

## 2012-07-03 ENCOUNTER — Telehealth: Payer: Self-pay | Admitting: *Deleted

## 2012-07-03 ENCOUNTER — Other Ambulatory Visit (HOSPITAL_BASED_OUTPATIENT_CLINIC_OR_DEPARTMENT_OTHER): Payer: BC Managed Care – PPO | Admitting: Lab

## 2012-07-03 DIAGNOSIS — D649 Anemia, unspecified: Secondary | ICD-10-CM

## 2012-07-03 DIAGNOSIS — N289 Disorder of kidney and ureter, unspecified: Secondary | ICD-10-CM

## 2012-07-03 LAB — COMPREHENSIVE METABOLIC PANEL
ALT: 16 U/L (ref 0–35)
AST: 16 U/L (ref 0–37)
Albumin: 3.9 g/dL (ref 3.5–5.2)
Alkaline Phosphatase: 107 U/L (ref 39–117)
BUN: 21 mg/dL (ref 6–23)
CO2: 29 mEq/L (ref 19–32)
Calcium: 9.8 mg/dL (ref 8.4–10.5)
Chloride: 94 mEq/L — ABNORMAL LOW (ref 96–112)
Creatinine, Ser: 1.61 mg/dL — ABNORMAL HIGH (ref 0.50–1.10)
Glucose, Bld: 497 mg/dL — ABNORMAL HIGH (ref 70–99)
Potassium: 4 mEq/L (ref 3.5–5.3)
Sodium: 132 mEq/L — ABNORMAL LOW (ref 135–145)
Total Bilirubin: 0.5 mg/dL (ref 0.3–1.2)
Total Protein: 7.1 g/dL (ref 6.0–8.3)

## 2012-07-03 LAB — CBC WITH DIFFERENTIAL/PLATELET
BASO%: 0.7 % (ref 0.0–2.0)
Basophils Absolute: 0 10*3/uL (ref 0.0–0.1)
EOS%: 3.8 % (ref 0.0–7.0)
Eosinophils Absolute: 0.2 10*3/uL (ref 0.0–0.5)
HCT: 40.2 % (ref 34.8–46.6)
HGB: 13 g/dL (ref 11.6–15.9)
LYMPH%: 32.8 % (ref 14.0–49.7)
MCH: 25.9 pg (ref 25.1–34.0)
MCHC: 32.4 g/dL (ref 31.5–36.0)
MCV: 80.1 fL (ref 79.5–101.0)
MONO#: 0.4 10*3/uL (ref 0.1–0.9)
MONO%: 8.9 % (ref 0.0–14.0)
NEUT#: 2.4 10*3/uL (ref 1.5–6.5)
NEUT%: 53.8 % (ref 38.4–76.8)
Platelets: 217 10*3/uL (ref 145–400)
RBC: 5.02 10*6/uL (ref 3.70–5.45)
RDW: 15.9 % — ABNORMAL HIGH (ref 11.2–14.5)
WBC: 4.5 10*3/uL (ref 3.9–10.3)
lymph#: 1.5 10*3/uL (ref 0.9–3.3)

## 2012-07-03 LAB — CHCC SMEAR

## 2012-07-03 NOTE — Telephone Encounter (Signed)
Called pt and left VM on cell phone her Anemia improved and to call back for more specific details/ results and any questions.

## 2012-07-03 NOTE — Telephone Encounter (Signed)
Message copied by Maudie Mercury on Wed Jul 03, 2012  3:52 PM ------      Message from: HA, Trudee Grip T      Created: Wed Jul 03, 2012 10:26 AM       Please call pt.  Her Hgb has improved with improvement of kidney function.  Continue to control HTN and DM which will improve her renal function and subsequently her anemia.  Thanks.

## 2012-07-04 ENCOUNTER — Telehealth: Payer: Self-pay | Admitting: *Deleted

## 2012-07-04 NOTE — Telephone Encounter (Signed)
Message copied by Maudie Mercury on Thu Jul 04, 2012 12:55 PM ------      Message from: HA, Trudee Grip T      Created: Thu Jul 04, 2012  9:16 AM       Please fax CMET to PCP and document that you've discussed this result with pt's nurse at PCP's office re: her poorly controlled DM.  Please advise patient to contact her PCP office for direction re: titration of her insulin.  Thanks.

## 2012-07-04 NOTE — Telephone Encounter (Signed)
Left VM for pt to return nurse's call on her cell and home phone.  Pt returned call and I informed her of blood glucose elevated on lab drawn yesterday.  It was 497.  Pt states she checked it this morning, fasting was 132.  Instructed her to f/u w/ PCP Dr. Melford Aase to manage blood sugars.  She verbalized understanding.  Faxed CMET to Dr. Idell Pickles office earlier today at fax [phone].   Called office and left Message for nurse informing of blood sugar and that result was faxed.

## 2012-09-01 ENCOUNTER — Other Ambulatory Visit: Payer: Self-pay | Admitting: Oncology

## 2012-09-01 DIAGNOSIS — D649 Anemia, unspecified: Secondary | ICD-10-CM

## 2012-09-02 ENCOUNTER — Encounter: Payer: Self-pay | Admitting: Oncology

## 2012-09-02 ENCOUNTER — Other Ambulatory Visit (HOSPITAL_BASED_OUTPATIENT_CLINIC_OR_DEPARTMENT_OTHER): Payer: BC Managed Care – PPO | Admitting: Lab

## 2012-09-02 ENCOUNTER — Telehealth: Payer: Self-pay | Admitting: Oncology

## 2012-09-02 ENCOUNTER — Ambulatory Visit (HOSPITAL_BASED_OUTPATIENT_CLINIC_OR_DEPARTMENT_OTHER): Payer: BC Managed Care – PPO | Admitting: Oncology

## 2012-09-02 VITALS — BP 198/121 | HR 88 | Temp 98.8°F | Resp 20 | Ht 59.0 in | Wt 193.1 lb

## 2012-09-02 DIAGNOSIS — D649 Anemia, unspecified: Secondary | ICD-10-CM

## 2012-09-02 DIAGNOSIS — I10 Essential (primary) hypertension: Secondary | ICD-10-CM

## 2012-09-02 DIAGNOSIS — N189 Chronic kidney disease, unspecified: Secondary | ICD-10-CM

## 2012-09-02 DIAGNOSIS — D638 Anemia in other chronic diseases classified elsewhere: Secondary | ICD-10-CM

## 2012-09-02 DIAGNOSIS — E119 Type 2 diabetes mellitus without complications: Secondary | ICD-10-CM

## 2012-09-02 LAB — CBC WITH DIFFERENTIAL/PLATELET
BASO%: 1.1 % (ref 0.0–2.0)
Basophils Absolute: 0.1 10*3/uL (ref 0.0–0.1)
EOS%: 5.3 % (ref 0.0–7.0)
Eosinophils Absolute: 0.3 10*3/uL (ref 0.0–0.5)
HCT: 36.9 % (ref 34.8–46.6)
HGB: 11.8 g/dL (ref 11.6–15.9)
LYMPH%: 37.3 % (ref 14.0–49.7)
MCH: 25.9 pg (ref 25.1–34.0)
MCHC: 31.9 g/dL (ref 31.5–36.0)
MCV: 81 fL (ref 79.5–101.0)
MONO#: 0.4 10*3/uL (ref 0.1–0.9)
MONO%: 8.3 % (ref 0.0–14.0)
NEUT#: 2.4 10*3/uL (ref 1.5–6.5)
NEUT%: 48 % (ref 38.4–76.8)
Platelets: 237 10*3/uL (ref 145–400)
RBC: 4.55 10*6/uL (ref 3.70–5.45)
RDW: 15.4 % — ABNORMAL HIGH (ref 11.2–14.5)
WBC: 4.9 10*3/uL (ref 3.9–10.3)
lymph#: 1.8 10*3/uL (ref 0.9–3.3)

## 2012-09-02 LAB — COMPREHENSIVE METABOLIC PANEL (CC13)
ALT: 15 U/L (ref 0–55)
AST: 16 U/L (ref 5–34)
Albumin: 3.7 g/dL (ref 3.5–5.0)
Alkaline Phosphatase: 65 U/L (ref 40–150)
BUN: 18 mg/dL (ref 7.0–26.0)
CO2: 25 mEq/L (ref 22–29)
Calcium: 9.9 mg/dL (ref 8.4–10.4)
Chloride: 102 mEq/L (ref 98–107)
Creatinine: 1.2 mg/dL — ABNORMAL HIGH (ref 0.6–1.1)
Glucose: 208 mg/dl — ABNORMAL HIGH (ref 70–99)
Potassium: 4 mEq/L (ref 3.5–5.1)
Sodium: 138 mEq/L (ref 136–145)
Total Bilirubin: 0.9 mg/dL (ref 0.20–1.20)
Total Protein: 7.3 g/dL (ref 6.4–8.3)

## 2012-09-02 LAB — CHCC SMEAR

## 2012-09-02 NOTE — Telephone Encounter (Signed)
Printed and gv pt appt schedule for DEC, FEB, and APRIL

## 2012-09-02 NOTE — Progress Notes (Signed)
[place]  Telephone:([phone] [phone]   OFFICE PROGRESS NOTE   Cc:  MCKEOWN,WILLIAM DAVID, MD  DIAGNOSIS: Anemia due to renal insufficiency  CURRENT THERAPY: Watchful observation  INTERVAL HISTORY: Jody Nguyen 56 y.o. female returns for routine follow-up by herself. She has been having trouble regulating her BP. She was previously on an oral BP medication, but this was switched recently due to sedation. She is now on a Clonidine patch. She is due to follow-up with PCP within the next 2 weeks. With regards to her anemia, she has been doing well. She has mild fatigue, but still able to perform her ADLs. She denies epistaxis, hemoptysis, hematemesis, hematuria, and melena. Denies chest pain, shortness of breath, and dyspnea. She is waling about 10 minutes 3 times a day and is trying to lose weight.   Past Medical History  Diagnosis Date  . Obesity   . Elevated cholesterol   . Vitamin D deficiency   . DM (diabetes mellitus)   . Hypertension   . CVA (cerebral infarction) 1997    no residual deficit  . Anemia   . Renal insufficiency     Past Surgical History  Procedure Date  . Cesarean section     x 2    Current Outpatient Prescriptions  Medication Sig Dispense Refill  . aspirin 81 MG tablet Take 81 mg by mouth daily.        . Cholecalciferol (VITAMIN D-3) 5000 UNITS TABS Take 1 tablet by mouth daily.      . famotidine (PEPCID) 40 MG tablet Take 1 tablet (40 mg total) by mouth daily.  10 tablet  0  . furosemide (LASIX) 80 MG tablet Take 80 mg by mouth 3 (three) times daily.      . insulin detemir (LEVEMIR) 100 UNIT/ML injection Inject 20 Units into the skin every morning.       . Iron TABS Take 27 mg by mouth 2 (two) times daily.      Marland Kitchen labetalol (NORMODYNE) 100 MG tablet Take 100 mg by mouth 2 (two) times daily.      . Magnesium 250 MG TABS Take 1 tablet by mouth daily.      . metFORMIN (GLUCOPHAGE) 1000 MG tablet Take 1 tablet by mouth  Twice daily.      . minoxidil (LONITEN) 10 MG tablet Take 1 tablet by mouth Daily.      Marland Kitchen olmesartan (BENICAR) 40 MG tablet Take 40 mg by mouth daily.      . potassium chloride SA (K-DUR,KLOR-CON) 20 MEQ tablet Take 20 mEq by mouth daily.        . rosuvastatin (CRESTOR) 20 MG tablet Take 20 mg by mouth daily.        . cloNIDine (CATAPRES - DOSED IN MG/24 HR) 0.2 mg/24hr patch 1 patch every 7 (seven) days.      Marland Kitchen losartan (COZAAR) 100 MG tablet Take 100 mg by mouth Daily.        ALLERGIES:   has no known allergies.  REVIEW OF SYSTEMS:  The rest of the 14-point review of system was negative.   Filed Vitals:   09/02/12 1028  BP: 198/121  Pulse: 88  Temp: 98.8 F (37.1 C)  Resp: 20   Wt Readings from Last 3 Encounters:  09/02/12 193 lb 1.6 oz (87.59 kg)  03/21/12 200 lb (90.719 kg)  03/04/12 198 lb 1.6 oz (89.858 kg)   ECOG Performance status: 1  PHYSICAL EXAMINATION:  General:  well-nourished in no acute distress.  Eyes:  no scleral icterus.  ENT:  There were no oropharyngeal lesions.  Neck was without thyromegaly.  Lymphatics:  Negative cervical, supraclavicular or axillary adenopathy.  Respiratory: lungs were clear bilaterally without wheezing or crackles.  Cardiovascular:  Regular rate and rhythm, S1/S2, without murmur, rub or gallop.  There was no pedal edema.  GI:  abdomen was soft, flat, nontender, nondistended, without organomegaly.  Muscoloskeletal:  no spinal tenderness of palpation of vertebral spine.  Skin exam was without echymosis, petichae.  Neuro exam was nonfocal.  Patient was able to get on and off exam table without assistance.  Gait was normal.  Patient was alerted and oriented.  Attention was good.   Language was appropriate.  Mood was normal without depression.  Speech was not pressured.  Thought content was not tangential.    LABORATORY/RADIOLOGY DATA:  Lab Results  Component Value Date   WBC 4.9 09/02/2012   HGB 11.8 09/02/2012   HCT 36.9 09/02/2012   PLT 237  09/02/2012   GLUCOSE 208* 09/02/2012   CHOL  Value: 125        ATP III CLASSIFICATION:  <200     mg/dL   Desirable  200-239  mg/dL   Borderline High  >=240    mg/dL   High 08/13/2008   TRIG 131 08/13/2008   HDL 31* 08/13/2008   LDLCALC  Value: 68        Total Cholesterol/HDL:CHD Risk Coronary Heart Disease Risk Table                     Men   Women  1/2 Average Risk   3.4   3.3 08/13/2008   ALKPHOS 65 09/02/2012   ALT 15 09/02/2012   AST 16 09/02/2012   NA 138 09/02/2012   K 4.0 09/02/2012   CL 102 09/02/2012   CREATININE 1.2* 09/02/2012   BUN 18.0 09/02/2012   CO2 25 09/02/2012   HGBA1C  Value: 6.1 (NOTE)   The ADA recommends the following therapeutic goal for glycemic   control related to Hgb A1C measurement:   Goal of Therapy:   < 7.0% Hgb A1C   Reference: American Diabetes Association: Clinical Practice   Recommendations 2008, Diabetes Care,  2008, 31:(Suppl 1). 08/13/2008   ASSESSMENT AND PLAN:   1. Type II diabetes mellitus: She is on metformin and Levemir insulin per PCP.   2. Hypertension: BP elevated in our office today. She is on Clonidine, Lasix, Labetalol, Benicar, Norvasc. She is working closely with PCP to titrate her BP meds.   3. Hyperlipidemia: She is on Crestor per PCP.   4. Obesity: I advised her to talk with her PCP to see whether a repeat sleep apnea study is indicated. She said that when it was done last, she did not feel comforatble with the result that she did not have OSA. On my exam today, given her body habitus and oropharynx exam, she is at high risk of OSA. If this is the diagnosis, treating her with CPAP may help improve her obesity.   5. Chronic kidney disease: Most likely due to DM and HTN.   6. CVA: She is on ASA, statin, and multiple BP meds.   7. Chronic normocytic anemia: Negative GI work up. Anemia from chronic kidney disease is the most likely diagnosis. Treatment: control BP, and DM. No role for hem intervention such mild anemia. In the future, if she develops  worsened anemia  with negative work up, I may consider pRBC transfusion for Hgb <8. If she requires frequent pRBC transfusion, I may consider Aranesp injection; however, given her history of CVA, I would like to delay this as long as possible.   8. Follow up: Lab only appointment in about 2 and 4 months for stability of her Hgb. She has follow up appointment in about 6 months.        The length of time of the face-to-face encounter was 25 minutes. More than 50% of time was spent counseling and coordination of care.

## 2012-09-19 ENCOUNTER — Encounter: Payer: Self-pay | Admitting: Cardiology

## 2012-10-14 ENCOUNTER — Encounter: Payer: Self-pay | Admitting: Cardiology

## 2012-10-16 ENCOUNTER — Other Ambulatory Visit: Payer: Self-pay | Admitting: Internal Medicine

## 2012-10-16 DIAGNOSIS — I10 Essential (primary) hypertension: Secondary | ICD-10-CM

## 2012-10-22 ENCOUNTER — Ambulatory Visit
Admission: RE | Admit: 2012-10-22 | Discharge: 2012-10-22 | Disposition: A | Discharge status: Home / Self Care | Payer: BC Managed Care – PPO | Source: Ambulatory Visit | Attending: Internal Medicine | Admitting: Internal Medicine

## 2012-10-22 DIAGNOSIS — I10 Essential (primary) hypertension: Secondary | ICD-10-CM

## 2012-11-01 ENCOUNTER — Other Ambulatory Visit (HOSPITAL_BASED_OUTPATIENT_CLINIC_OR_DEPARTMENT_OTHER): Payer: BC Managed Care – PPO | Admitting: Lab

## 2012-11-01 ENCOUNTER — Telehealth: Payer: Self-pay

## 2012-11-01 DIAGNOSIS — D649 Anemia, unspecified: Secondary | ICD-10-CM

## 2012-11-01 LAB — CBC WITH DIFFERENTIAL/PLATELET
BASO%: 0.5 % (ref 0.0–2.0)
Basophils Absolute: 0 10*3/uL (ref 0.0–0.1)
EOS%: 3.8 % (ref 0.0–7.0)
Eosinophils Absolute: 0.2 10*3/uL (ref 0.0–0.5)
HCT: 34.9 % (ref 34.8–46.6)
HGB: 11.3 g/dL — ABNORMAL LOW (ref 11.6–15.9)
LYMPH%: 34.5 % (ref 14.0–49.7)
MCH: 26.4 pg (ref 25.1–34.0)
MCHC: 32.5 g/dL (ref 31.5–36.0)
MCV: 81.1 fL (ref 79.5–101.0)
MONO#: 0.4 10*3/uL (ref 0.1–0.9)
MONO%: 9.2 % (ref 0.0–14.0)
NEUT#: 2.5 10*3/uL (ref 1.5–6.5)
NEUT%: 52 % (ref 38.4–76.8)
Platelets: 219 10*3/uL (ref 145–400)
RBC: 4.3 10*6/uL (ref 3.70–5.45)
RDW: 15 % — ABNORMAL HIGH (ref 11.2–14.5)
WBC: 4.8 10*3/uL (ref 3.9–10.3)
lymph#: 1.7 10*3/uL (ref 0.9–3.3)

## 2012-11-01 NOTE — Telephone Encounter (Signed)
Message copied by Azzie Glatter on Fri Nov 01, 2012  5:03 PM ------      Message from: Maryanna Shape      Created: Fri Nov 01, 2012  2:36 PM       Call pt. Hemoglobin is 11.3. This is down slightly, but overall stable. Recommend continued observation.

## 2012-11-04 ENCOUNTER — Telehealth: Payer: Self-pay | Admitting: *Deleted

## 2012-11-04 NOTE — Telephone Encounter (Signed)
Message copied by Maudie Mercury on Mon Nov 04, 2012  3:26 PM ------      Message from: Azzie Glatter      Created: Fri Nov 01, 2012  5:05 PM                   ----- Message -----         From: Maryanna Shape, NP         Sent: 11/01/2012   2:36 PM           To: Faylene Kurtz, RN            Call pt. Hemoglobin is 11.3. This is down slightly, but overall stable. Recommend continued observation.

## 2012-11-04 NOTE — Telephone Encounter (Signed)
Called pt w/ Hgb results and informed slightly down but overall stable per Mikey Bussing, NP and to keep next lab in February as scheduled. Pt verbalized understanding.

## 2012-11-15 ENCOUNTER — Encounter: Payer: Self-pay | Admitting: Cardiology

## 2012-12-31 ENCOUNTER — Ambulatory Visit (INDEPENDENT_AMBULATORY_CARE_PROVIDER_SITE_OTHER): Payer: BC Managed Care – PPO | Admitting: Cardiology

## 2012-12-31 ENCOUNTER — Encounter: Payer: Self-pay | Admitting: Cardiology

## 2012-12-31 VITALS — BP 152/96 | HR 70 | Ht 59.0 in | Wt 194.8 lb

## 2012-12-31 DIAGNOSIS — I10 Essential (primary) hypertension: Secondary | ICD-10-CM

## 2012-12-31 MED ORDER — MINOXIDIL 2.5 MG PO TABS: 5.0000 mg | ORAL_TABLET | Freq: Every day | ORAL | Status: DC

## 2012-12-31 NOTE — Patient Instructions (Addendum)
Your physician recommends that you schedule a follow-up appointment in: [place] MINOXIDIL TO 15 MG ONCE DAILY  Your physician has requested that you have an echocardiogram. Echocardiography is a painless test that uses sound waves to create images of your heart. It provides your doctor with information about the size and shape of your heart and how well your heart's chambers and valves are working. This procedure takes approximately one hour. There are no restrictions for this procedure.

## 2012-12-31 NOTE — Assessment & Plan Note (Signed)
Blood pressure is elevated. She is on 2 ARB's. Discontinue Benicar and continue Cozaar. Increase minoxidil to 15 mg daily. She will follow her blood pressure at home and keep records. She will bring her cuff at next office visit to make sure it is accurate. Schedule echocardiogram to evaluate LV function and left ventricular hypertrophy. We discussed lifestyle modification.

## 2012-12-31 NOTE — Progress Notes (Signed)
HPI: Pleasant female for fu of hypertension. Chest x-ray in Oct 2012 revealed cardiac enlargement, mild peribronchial thickening. Myoview in Oct 2012 showed EF 49, no ischemia, LVE. Echo in Oct 2012 showed EF 50-55, moderate LVH, grade 1 diastolic dysfunction, mild LAE. Renal dopplers Nov 2013 showed no RAS. BUN and CR in 12/13 30/1.33. Patient's blood pressure has remained elevated. She states it typically runs 150/95-100. She denies dyspnea, chest pain, pedal edema or syncope. She follows a low-sodium diet. She does not smoke or consume alcohol.   Current Outpatient Prescriptions  Medication Sig Dispense Refill  . aspirin 81 MG tablet Take 81 mg by mouth daily.        . Cholecalciferol (VITAMIN D-3) 5000 UNITS TABS Take 1 tablet by mouth daily.      . cloNIDine (CATAPRES - DOSED IN MG/24 HR) 0.2 mg/24hr patch 1 patch every 7 (seven) days.      . famotidine (PEPCID) 40 MG tablet Take 1 tablet (40 mg total) by mouth daily.  10 tablet  0  . furosemide (LASIX) 80 MG tablet Take 80 mg by mouth 2 (two) times daily.       . insulin detemir (LEVEMIR) 100 UNIT/ML injection Inject 20 Units into the skin every morning.       . Iron TABS Take 27 mg by mouth 2 (two) times daily.      Marland Kitchen labetalol (NORMODYNE) 100 MG tablet Take 100 mg by mouth 2 (two) times daily.      Marland Kitchen losartan (COZAAR) 100 MG tablet Take 100 mg by mouth Daily.      . Magnesium 250 MG TABS Take 1 tablet by mouth daily.      . metFORMIN (GLUCOPHAGE) 1000 MG tablet Take 1 tablet by mouth Twice daily.      . minoxidil (LONITEN) 10 MG tablet Take 1 tablet by mouth Daily.      Marland Kitchen olmesartan (BENICAR) 40 MG tablet Take 40 mg by mouth daily.      . potassium chloride SA (K-DUR,KLOR-CON) 20 MEQ tablet Take 20 mEq by mouth daily.        . rosuvastatin (CRESTOR) 20 MG tablet Take 20 mg by mouth daily.           Past Medical History  Diagnosis Date  . Obesity   . Elevated cholesterol   . Vitamin D deficiency   . DM (diabetes mellitus)   .  Hypertension   . CVA (cerebral infarction) 1997    no residual deficit  . Anemia   . Renal insufficiency     Past Surgical History  Procedure Date  . Cesarean section     x 2    History   Social History  . Marital Status: Married    Spouse Name: N/A    Number of Children: 3  . Years of Education: N/A   Occupational History  .      Unemployed; used to work as a Radio broadcast assistant   Social History Main Topics  . Smoking status: Never Smoker   . Smokeless tobacco: Never Used  . Alcohol Use: No  . Drug Use: No  . Sexually Active: Yes    Birth Control/ Protection: None   Other Topics Concern  . Not on file   Social History Narrative  . No narrative on file    ROS: no fevers or chills, productive cough, hemoptysis, dysphasia, odynophagia, melena, hematochezia, dysuria, hematuria, rash, seizure activity, orthopnea, PND, pedal edema, claudication. Remaining systems  are negative.  Physical Exam: Well-developed well-nourished in no acute distress.  Skin is warm and dry.  HEENT is normal.  Neck is supple.  Chest is clear to auscultation with normal expansion.  Cardiovascular exam is regular rate and rhythm.  Abdominal exam nontender or distended. No masses palpated. Extremities show no edema. neuro grossly intact  ECG sinus rhythm at a rate of 70. First degree AV block. Left ventricular hypertrophy with repolarization abnormality. Prolonged QT interval. Left axis deviation.

## 2012-12-31 NOTE — Assessment & Plan Note (Signed)
Management per primary care. 

## 2013-01-03 ENCOUNTER — Telehealth: Payer: Self-pay | Admitting: *Deleted

## 2013-01-03 ENCOUNTER — Other Ambulatory Visit (HOSPITAL_BASED_OUTPATIENT_CLINIC_OR_DEPARTMENT_OTHER): Payer: BC Managed Care – PPO | Admitting: Lab

## 2013-01-03 DIAGNOSIS — D649 Anemia, unspecified: Secondary | ICD-10-CM

## 2013-01-03 LAB — CBC WITH DIFFERENTIAL/PLATELET
BASO%: 0.8 % (ref 0.0–2.0)
Basophils Absolute: 0 10*3/uL (ref 0.0–0.1)
EOS%: 3.1 % (ref 0.0–7.0)
Eosinophils Absolute: 0.1 10*3/uL (ref 0.0–0.5)
HCT: 37.8 % (ref 34.8–46.6)
HGB: 12.2 g/dL (ref 11.6–15.9)
LYMPH%: 35.7 % (ref 14.0–49.7)
MCH: 25.9 pg (ref 25.1–34.0)
MCHC: 32.3 g/dL (ref 31.5–36.0)
MCV: 80 fL (ref 79.5–101.0)
MONO#: 0.4 10*3/uL (ref 0.1–0.9)
MONO%: 8.5 % (ref 0.0–14.0)
NEUT#: 2.3 10*3/uL (ref 1.5–6.5)
NEUT%: 51.9 % (ref 38.4–76.8)
Platelets: 232 10*3/uL (ref 145–400)
RBC: 4.72 10*6/uL (ref 3.70–5.45)
RDW: 15.5 % — ABNORMAL HIGH (ref 11.2–14.5)
WBC: 4.5 10*3/uL (ref 3.9–10.3)
lymph#: 1.6 10*3/uL (ref 0.9–3.3)

## 2013-01-03 NOTE — Telephone Encounter (Signed)
Informed pt of CBC, including Hgb wnl per Dr. Lamonte Sakai and to keep her next appt on 4/07 as scheduled.  She verbalized understanding.

## 2013-01-09 ENCOUNTER — Other Ambulatory Visit (HOSPITAL_COMMUNITY): Payer: BC Managed Care – PPO

## 2013-01-20 ENCOUNTER — Ambulatory Visit (HOSPITAL_COMMUNITY): Payer: BC Managed Care – PPO | Attending: Cardiovascular Disease | Admitting: Radiology

## 2013-01-20 DIAGNOSIS — R0989 Other specified symptoms and signs involving the circulatory and respiratory systems: Secondary | ICD-10-CM | POA: Insufficient documentation

## 2013-01-20 DIAGNOSIS — I080 Rheumatic disorders of both mitral and aortic valves: Secondary | ICD-10-CM | POA: Insufficient documentation

## 2013-01-20 DIAGNOSIS — R0609 Other forms of dyspnea: Secondary | ICD-10-CM | POA: Insufficient documentation

## 2013-01-20 DIAGNOSIS — I079 Rheumatic tricuspid valve disease, unspecified: Secondary | ICD-10-CM | POA: Insufficient documentation

## 2013-01-20 DIAGNOSIS — I10 Essential (primary) hypertension: Secondary | ICD-10-CM

## 2013-01-20 DIAGNOSIS — I379 Nonrheumatic pulmonary valve disorder, unspecified: Secondary | ICD-10-CM | POA: Insufficient documentation

## 2013-01-20 NOTE — Progress Notes (Signed)
Echocardiogram performed.  

## 2013-02-12 ENCOUNTER — Ambulatory Visit (INDEPENDENT_AMBULATORY_CARE_PROVIDER_SITE_OTHER): Payer: BC Managed Care – PPO | Admitting: Cardiology

## 2013-02-12 ENCOUNTER — Encounter: Payer: Self-pay | Admitting: Cardiology

## 2013-02-12 VITALS — BP 159/88 | HR 82 | Ht 60.0 in | Wt 198.0 lb

## 2013-02-12 DIAGNOSIS — D649 Anemia, unspecified: Secondary | ICD-10-CM

## 2013-02-12 DIAGNOSIS — N289 Disorder of kidney and ureter, unspecified: Secondary | ICD-10-CM

## 2013-02-12 LAB — BASIC METABOLIC PANEL
BUN: 32 mg/dL — ABNORMAL HIGH (ref 6–23)
CO2: 29 mEq/L (ref 19–32)
Calcium: 9.6 mg/dL (ref 8.4–10.5)
Chloride: 98 mEq/L (ref 96–112)
Creatinine, Ser: 1.4 mg/dL — ABNORMAL HIGH (ref 0.4–1.2)
GFR: 48.09 mL/min — ABNORMAL LOW (ref >60.00)
Glucose, Bld: 220 mg/dL — ABNORMAL HIGH (ref 70–99)
Potassium: 3.2 mEq/L — ABNORMAL LOW (ref 3.5–5.1)
Sodium: 138 mEq/L (ref 135–145)

## 2013-02-12 LAB — HEPATIC FUNCTION PANEL
ALT: 20 U/L (ref 0–35)
AST: 22 U/L (ref 0–37)
Albumin: 4.1 g/dL (ref 3.5–5.2)
Alkaline Phosphatase: 65 U/L (ref 39–117)
Bilirubin, Direct: 0.1 mg/dL (ref 0.0–0.3)
Total Bilirubin: 0.7 mg/dL (ref 0.3–1.2)
Total Protein: 7.9 g/dL (ref 6.0–8.3)

## 2013-02-12 MED ORDER — LABETALOL HCL 200 MG PO TABS: 200.0000 mg | ORAL_TABLET | Freq: Two times a day (BID) | ORAL | Status: DC

## 2013-02-12 NOTE — Assessment & Plan Note (Signed)
Recheck renal function. Check LFTs.

## 2013-02-12 NOTE — Patient Instructions (Addendum)
Your physician wants you to follow-up in: [place] will receive a reminder letter in the mail two months in advance. If you don't receive a letter, please call our office to schedule the follow-up appointment.   INCREASE LABETALOL TO 200 MG TWICE DAILY  Your physician recommends that YOU HAVE LAB WORK TODAY

## 2013-02-12 NOTE — Progress Notes (Signed)
HPI: Pleasant female for fu of hypertension. Chest x-ray in Oct 2012 revealed cardiac enlargement, mild peribronchial thickening. Myoview in Oct 2012 showed EF 49, no ischemia, LVE. Renal dopplers Nov 2013 showed no RAS. BUN and CR in 12/13 30/1.33. Echocardiogram in February of 2014 showed mild left ventricular enlargement, moderate left ventricular hypertrophy and an ejection fraction of 50-55%. There was moderate left atrial enlargement, mild mitral regurgitation and a small pericardial effusion was noted. I last saw her in February of 2014. Since then, the patient denies any dyspnea on exertion, orthopnea, PND, pedal edema, palpitations, syncope or chest pain.    Current Outpatient Prescriptions  Medication Sig Dispense Refill  . aspirin 81 MG tablet Take 81 mg by mouth daily.        . Canagliflozin (INVOKANA) 300 MG TABS Take 1 tablet by mouth daily.      . Cholecalciferol (VITAMIN D-3) 5000 UNITS TABS Take 1 tablet by mouth daily.      . cloNIDine (CATAPRES - DOSED IN MG/24 HR) 0.2 mg/24hr patch 1 patch every 7 (seven) days.      . famotidine (PEPCID) 40 MG tablet Take 1 tablet (40 mg total) by mouth daily.  10 tablet  0  . furosemide (LASIX) 80 MG tablet Take 80 mg by mouth 2 (two) times daily.       . insulin detemir (LEVEMIR) 100 UNIT/ML injection Inject 20 Units into the skin every morning.       . labetalol (NORMODYNE) 100 MG tablet Take 100 mg by mouth 2 (two) times daily.      . Magnesium 250 MG TABS Take 1 tablet by mouth daily.      . metFORMIN (GLUCOPHAGE) 1000 MG tablet Take 1 tablet by mouth Twice daily.      . minoxidil (LONITEN) 10 MG tablet 10 mg daily.       . minoxidil (LONITEN) 2.5 MG tablet Take 2 tablets (5 mg total) by mouth daily.  180 tablet  3  . potassium chloride SA (K-DUR,KLOR-CON) 20 MEQ tablet Take 20 mEq by mouth daily.        . rosuvastatin (CRESTOR) 20 MG tablet Take 20 mg by mouth daily.         No current facility-administered medications for this  visit.     Past Medical History  Diagnosis Date  . Obesity   . Elevated cholesterol   . Vitamin D deficiency   . DM (diabetes mellitus)   . Hypertension   . CVA (cerebral infarction) 1997    no residual deficit  . Anemia   . Renal insufficiency     Past Surgical History  Procedure Laterality Date  . Cesarean section      x 2    History   Social History  . Marital Status: Married    Spouse Name: N/A    Number of Children: 3  . Years of Education: N/A   Occupational History  .      Unemployed; used to work as a Radio broadcast assistant   Social History Main Topics  . Smoking status: Never Smoker   . Smokeless tobacco: Never Used  . Alcohol Use: No  . Drug Use: No  . Sexually Active: Yes    Birth Control/ Protection: None   Other Topics Concern  . Not on file   Social History Narrative  . No narrative on file    ROS: no fevers or chills, productive cough, hemoptysis, dysphasia, odynophagia, melena, hematochezia, dysuria,  hematuria, rash, seizure activity, orthopnea, PND, pedal edema, claudication. Remaining systems are negative.  Physical Exam: Well-developed well-nourished in no acute distress.  Skin is warm and dry.  HEENT is normal.  Neck is supple.  Chest is clear to auscultation with normal expansion.  Cardiovascular exam is regular rate and rhythm.  Abdominal exam nontender or distended. No masses palpated. Extremities show no edema. neuro grossly intact

## 2013-02-12 NOTE — Assessment & Plan Note (Signed)
Blood pressure improving but remains mildly elevated. Continue present medications but increase labetalol to 200 mg by mouth twice a day. Continue lifestyle modification.

## 2013-02-14 ENCOUNTER — Other Ambulatory Visit: Payer: Self-pay

## 2013-02-14 DIAGNOSIS — E876 Hypokalemia: Secondary | ICD-10-CM

## 2013-02-19 ENCOUNTER — Other Ambulatory Visit (INDEPENDENT_AMBULATORY_CARE_PROVIDER_SITE_OTHER): Payer: BC Managed Care – PPO

## 2013-02-19 DIAGNOSIS — E876 Hypokalemia: Secondary | ICD-10-CM

## 2013-02-19 LAB — BASIC METABOLIC PANEL
BUN: 25 mg/dL — ABNORMAL HIGH (ref 6–23)
CO2: 28 mEq/L (ref 19–32)
Calcium: 9.8 mg/dL (ref 8.4–10.5)
Chloride: 103 mEq/L (ref 96–112)
Creatinine, Ser: 1.4 mg/dL — ABNORMAL HIGH (ref 0.4–1.2)
GFR: 49.68 mL/min — ABNORMAL LOW (ref >60.00)
Glucose, Bld: 237 mg/dL — ABNORMAL HIGH (ref 70–99)
Potassium: 3.8 mEq/L (ref 3.5–5.1)
Sodium: 139 mEq/L (ref 135–145)

## 2013-03-03 ENCOUNTER — Other Ambulatory Visit (HOSPITAL_BASED_OUTPATIENT_CLINIC_OR_DEPARTMENT_OTHER): Payer: BC Managed Care – PPO | Admitting: Lab

## 2013-03-03 ENCOUNTER — Ambulatory Visit (HOSPITAL_BASED_OUTPATIENT_CLINIC_OR_DEPARTMENT_OTHER): Payer: BC Managed Care – PPO | Admitting: Oncology

## 2013-03-03 VITALS — BP 154/90 | HR 95 | Temp 98.0°F | Resp 20 | Ht 65.0 in | Wt 195.3 lb

## 2013-03-03 DIAGNOSIS — D649 Anemia, unspecified: Secondary | ICD-10-CM

## 2013-03-03 DIAGNOSIS — E119 Type 2 diabetes mellitus without complications: Secondary | ICD-10-CM

## 2013-03-03 DIAGNOSIS — N183 Chronic kidney disease, stage 3 unspecified: Secondary | ICD-10-CM

## 2013-03-03 DIAGNOSIS — I129 Hypertensive chronic kidney disease with stage 1 through stage 4 chronic kidney disease, or unspecified chronic kidney disease: Secondary | ICD-10-CM

## 2013-03-03 LAB — CHCC SMEAR

## 2013-03-03 LAB — COMPREHENSIVE METABOLIC PANEL (CC13)
ALT: 17 U/L (ref 0–55)
AST: 15 U/L (ref 5–34)
Albumin: 3.7 g/dL (ref 3.5–5.0)
Alkaline Phosphatase: 67 U/L (ref 40–150)
BUN: 28.1 mg/dL — ABNORMAL HIGH (ref 7.0–26.0)
CO2: 29 mEq/L (ref 22–29)
Calcium: 9.9 mg/dL (ref 8.4–10.4)
Chloride: 99 mEq/L (ref 98–107)
Creatinine: 1.9 mg/dL — ABNORMAL HIGH (ref 0.6–1.1)
Glucose: 210 mg/dl — ABNORMAL HIGH (ref 70–99)
Potassium: 3.8 mEq/L (ref 3.5–5.1)
Sodium: 140 mEq/L (ref 136–145)
Total Bilirubin: 0.55 mg/dL (ref 0.20–1.20)
Total Protein: 8 g/dL (ref 6.4–8.3)

## 2013-03-03 LAB — CBC WITH DIFFERENTIAL/PLATELET
BASO%: 0.7 % (ref 0.0–2.0)
Basophils Absolute: 0 10*3/uL (ref 0.0–0.1)
EOS%: 5 % (ref 0.0–7.0)
Eosinophils Absolute: 0.3 10*3/uL (ref 0.0–0.5)
HCT: 37.4 % (ref 34.8–46.6)
HGB: 12.1 g/dL (ref 11.6–15.9)
LYMPH%: 33.7 % (ref 14.0–49.7)
MCH: 25.1 pg (ref 25.1–34.0)
MCHC: 32.2 g/dL (ref 31.5–36.0)
MCV: 77.9 fL — ABNORMAL LOW (ref 79.5–101.0)
MONO#: 0.4 10*3/uL (ref 0.1–0.9)
MONO%: 6.8 % (ref 0.0–14.0)
NEUT#: 2.8 10*3/uL (ref 1.5–6.5)
NEUT%: 53.8 % (ref 38.4–76.8)
Platelets: 288 10*3/uL (ref 145–400)
RBC: 4.8 10*6/uL (ref 3.70–5.45)
RDW: 16.1 % — ABNORMAL HIGH (ref 11.2–14.5)
WBC: 5.3 10*3/uL (ref 3.9–10.3)
lymph#: 1.8 10*3/uL (ref 0.9–3.3)

## 2013-03-03 NOTE — Progress Notes (Signed)
[name]  [phone] [phone]   OFFICE PROGRESS NOTE   Cc:  Jody DAVID, MD  DIAGNOSIS: anemia of chronic kidney disease.   CURRENT THERAPY: watchful observation.   INTERVAL HISTORY: Jody Nguyen 57 y.o. female returns for regular follow up.  She reports doing well.  She denied any source of bleeding.  She has mild SOB and DOE on walking long distance or climbing stairs.    Patient denies fever, anorexia, weight loss, headache, visual changes, confusion, drenching night sweats, palpable lymph node swelling, mucositis, odynophagia, dysphagia, nausea vomiting, jaundice, chest pain, palpitation, productive cough, gum bleeding, epistaxis, hematemesis, hemoptysis, abdominal pain, abdominal swelling, early satiety, melena, hematochezia, hematuria, skin rash, spontaneous bleeding, joint swelling, joint pain, heat or cold intolerance, bowel bladder incontinence, back pain, focal motor weakness, paresthesia, depression.    Past Medical History  Diagnosis Date  . Obesity   . Elevated cholesterol   . Vitamin D deficiency   . DM (diabetes mellitus)   . Hypertension   . CVA (cerebral infarction) 1997    no residual deficit  . Anemia   . Renal insufficiency     Past Surgical History  Procedure Laterality Date  . Cesarean section      x 2    Current Outpatient Prescriptions  Medication Sig Dispense Refill  . aspirin 81 MG tablet Take 81 mg by mouth daily.        Marland Kitchen BAYER CONTOUR TEST test strip Use as directed      . Canagliflozin (INVOKANA) 300 MG TABS Take 1 tablet by mouth daily.      . Cholecalciferol (VITAMIN D-3) 5000 UNITS TABS Take 1 tablet by mouth daily.      . cloNIDine (CATAPRES - DOSED IN MG/24 HR) 0.2 mg/24hr patch 1 patch every 7 (seven) days.      . famotidine (PEPCID) 40 MG tablet Take 1 tablet (40 mg total) by mouth daily.  10 tablet  0  . furosemide (LASIX) 80 MG tablet Take 80 mg by mouth 2 (two) times daily.        . insulin detemir (LEVEMIR) 100 UNIT/ML injection Inject 20 Units into the skin every morning.       . labetalol (NORMODYNE) 200 MG tablet Take 1 tablet (200 mg total) by mouth 2 (two) times daily.  180 tablet  4  . Lancets MISC Use as directed      . Magnesium 250 MG TABS Take 1 tablet by mouth daily.      . metFORMIN (GLUCOPHAGE) 1000 MG tablet Take 1 tablet by mouth Twice daily.      . minoxidil (LONITEN) 10 MG tablet 10 mg daily.       . minoxidil (LONITEN) 2.5 MG tablet Take 2 tablets (5 mg total) by mouth daily.  180 tablet  3  . potassium chloride SA (K-DUR,KLOR-CON) 20 MEQ tablet Take 20 mEq by mouth daily.        . rosuvastatin (CRESTOR) 20 MG tablet Take 20 mg by mouth daily.         No current facility-administered medications for this visit.    ALLERGIES:  has No Known Allergies.  REVIEW OF SYSTEMS:  The rest of the 14-point review of system was negative.   Filed Vitals:   03/03/13 1008  BP: 154/90  Pulse: 95  Temp: 98 F (36.7 C)  Resp: 20   Wt Readings from Last 3 Encounters:  03/03/13 195 lb 4.8 oz (88.587 kg)  02/12/13 198 lb (89.812 kg)  12/31/12 194 lb 12.8 oz (88.361 kg)   ECOG Performance status: 1  PHYSICAL EXAMINATION:   General: Obese woman, in no acute distress.  Eyes:  no scleral icterus.  ENT:  There were no oropharyngeal lesions.  Neck was without thyromegaly.  Lymphatics:  Negative cervical, supraclavicular or axillary adenopathy.  Respiratory: lungs were clear bilaterally without wheezing or crackles.  Cardiovascular:  Regular rate and rhythm, S1/S2, without murmur, rub or gallop.  There was no pedal edema.  GI:  abdomen was soft, flat, nontender, nondistended, without organomegaly.  Muscoloskeletal:  no spinal tenderness of palpation of vertebral spine.  Skin exam was without echymosis, petichae.  Neuro exam was nonfocal.  Patient was able to get on and off exam table without assistance.  Gait was normal.  Patient was alerted and oriented.   Attention was good.   Language was appropriate.  Mood was normal without depression.  Speech was not pressured.  Thought content was not tangential.         LABORATORY/RADIOLOGY DATA:  Lab Results  Component Value Date   WBC 5.3 03/03/2013   HGB 12.1 03/03/2013   HCT 37.4 03/03/2013   PLT 288 03/03/2013   GLUCOSE 210* 03/03/2013   CHOL  Value: 125        ATP III CLASSIFICATION:  <200     mg/dL   Desirable  200-239  mg/dL   Borderline High  >=240    mg/dL   High 08/13/2008   TRIG 131 08/13/2008   HDL 31* 08/13/2008   LDLCALC  Value: 68        Total Cholesterol/HDL:CHD Risk Coronary Heart Disease Risk Table                     Men   Women  1/2 Average Risk   3.4   3.3 08/13/2008   ALKPHOS 67 03/03/2013   ALT 17 03/03/2013   AST 15 03/03/2013   NA 140 03/03/2013   K 3.8 03/03/2013   CL 99 03/03/2013   CREATININE 1.9* 03/03/2013   BUN 28.1* 03/03/2013   CO2 29 03/03/2013   HGBA1C  Value: 6.1 (NOTE)   The ADA recommends the following therapeutic goal for glycemic   control related to Hgb A1C measurement:   Goal of Therapy:   < 7.0% Hgb A1C   Reference: American Diabetes Association: Clinical Practice   Recommendations 2008, Diabetes Care,  2008, 31:(Suppl 1). 08/13/2008     ASSESSMENT AND PLAN:   1.  HTN:  She is on clonidine, furosemide, labetalol, minoxidil. Her blood pressure still slightly elevated. I advised her to follow up with her PCP to see whether further medication adjustment can be made or whether it is appropriate to consider ACE inhibitor in patients with diabetes and hypertension.  2.  diabetes mellitus: She is on insulin, metformin per PCP.  3.  chronic kidney disease, stage II-III: Most likely due to hypertension and diabetes mellitus. Her creatinine is stable. She was ruled out in the past for multiple myeloma.  4.  chronic normocytic anemia due to chronic kidney disease: She had colonoscopy last year and was negative. She has no iron deficiency or hemolysis or myeloma. Her hemoglobin is stable.  There is no indication for further work up from hematology standpoint.  5.  Disposition: Discharge back to PCP. I recommend following CBC and creatinine about twice a year. In the future, if she has unexplained worsening anemia despite stable renal  function, then further workup at the [facility] be considered at that time.  Ms. Lybarger agreed with this plan.  Thank you for this referral.     The length of time of the face-to-face encounter was 10 minutes. More than 50% of time was spent counseling and coordination of care.

## 2013-03-19 ENCOUNTER — Other Ambulatory Visit (HOSPITAL_COMMUNITY): Payer: Self-pay | Admitting: Internal Medicine

## 2013-03-19 DIAGNOSIS — Z1231 Encounter for screening mammogram for malignant neoplasm of breast: Secondary | ICD-10-CM

## 2013-03-20 ENCOUNTER — Ambulatory Visit (HOSPITAL_COMMUNITY)
Admission: RE | Admit: 2013-03-20 | Discharge: 2013-03-20 | Disposition: A | Discharge status: Home / Self Care | Payer: BC Managed Care – PPO | Source: Ambulatory Visit | Attending: Internal Medicine | Admitting: Internal Medicine

## 2013-03-20 DIAGNOSIS — Z1231 Encounter for screening mammogram for malignant neoplasm of breast: Secondary | ICD-10-CM

## 2013-12-15 ENCOUNTER — Encounter: Payer: Self-pay | Admitting: *Deleted

## 2013-12-19 ENCOUNTER — Encounter: Payer: Self-pay | Admitting: Emergency Medicine

## 2013-12-19 ENCOUNTER — Ambulatory Visit (INDEPENDENT_AMBULATORY_CARE_PROVIDER_SITE_OTHER): Payer: BC Managed Care – PPO | Admitting: Emergency Medicine

## 2013-12-19 VITALS — BP 198/110 | HR 80 | Temp 98.2°F | Resp 18 | Ht 60.0 in | Wt 197.0 lb

## 2013-12-19 DIAGNOSIS — I10 Essential (primary) hypertension: Secondary | ICD-10-CM

## 2013-12-19 DIAGNOSIS — E119 Type 2 diabetes mellitus without complications: Secondary | ICD-10-CM

## 2013-12-19 DIAGNOSIS — E782 Mixed hyperlipidemia: Secondary | ICD-10-CM

## 2013-12-19 DIAGNOSIS — R7309 Other abnormal glucose: Secondary | ICD-10-CM

## 2013-12-19 DIAGNOSIS — E669 Obesity, unspecified: Secondary | ICD-10-CM

## 2013-12-19 LAB — CBC WITH DIFFERENTIAL/PLATELET
Basophils Absolute: 0 10*3/uL (ref 0.0–0.1)
Basophils Relative: 0 % (ref 0–1)
Eosinophils Absolute: 0.2 10*3/uL (ref 0.0–0.7)
Eosinophils Relative: 4 % (ref 0–5)
HCT: 37.8 % (ref 36.0–46.0)
Hemoglobin: 12.3 g/dL (ref 12.0–15.0)
Lymphocytes Relative: 40 % (ref 12–46)
Lymphs Abs: 1.7 10*3/uL (ref 0.7–4.0)
MCH: 25.3 pg — ABNORMAL LOW (ref 26.0–34.0)
MCHC: 32.5 g/dL (ref 30.0–36.0)
MCV: 77.6 fL — ABNORMAL LOW (ref 78.0–100.0)
Monocytes Absolute: 0.3 10*3/uL (ref 0.1–1.0)
Monocytes Relative: 8 % (ref 3–12)
Neutro Abs: 2.1 10*3/uL (ref 1.7–7.7)
Neutrophils Relative %: 48 % (ref 43–77)
Platelets: 252 10*3/uL (ref 150–400)
RBC: 4.87 MIL/uL (ref 3.87–5.11)
RDW: 16.3 % — ABNORMAL HIGH (ref 11.5–15.5)
WBC: 4.4 10*3/uL (ref 4.0–10.5)

## 2013-12-19 LAB — BASIC METABOLIC PANEL WITH GFR
BUN: 21 mg/dL (ref 6–23)
CO2: 31 mEq/L (ref 19–32)
Calcium: 10.6 mg/dL — ABNORMAL HIGH (ref 8.4–10.5)
Chloride: 97 mEq/L (ref 96–112)
Creat: 1.36 mg/dL — ABNORMAL HIGH (ref 0.50–1.10)
GFR, Est African American: 49 mL/min — ABNORMAL LOW
GFR, Est Non African American: 43 mL/min — ABNORMAL LOW
Glucose, Bld: 284 mg/dL — ABNORMAL HIGH (ref 70–99)
Potassium: 3.9 mEq/L (ref 3.5–5.3)
Sodium: 139 mEq/L (ref 135–145)

## 2013-12-19 LAB — HEPATIC FUNCTION PANEL
ALT: 12 U/L (ref 0–35)
AST: 14 U/L (ref 0–37)
Albumin: 4.3 g/dL (ref 3.5–5.2)
Alkaline Phosphatase: 71 U/L (ref 39–117)
Bilirubin, Direct: 0.1 mg/dL (ref 0.0–0.3)
Indirect Bilirubin: 0.5 mg/dL (ref 0.0–0.9)
Total Bilirubin: 0.6 mg/dL (ref 0.3–1.2)
Total Protein: 7.6 g/dL (ref 6.0–8.3)

## 2013-12-19 LAB — LIPID PANEL
Cholesterol: 176 mg/dL (ref 0–200)
HDL: 69 mg/dL (ref >39)
LDL Cholesterol: 94 mg/dL (ref 0–99)
Total CHOL/HDL Ratio: 2.6 Ratio
Triglycerides: 65 mg/dL (ref <150)
VLDL: 13 mg/dL (ref 0–40)

## 2013-12-19 LAB — HEMOGLOBIN A1C
Hgb A1c MFr Bld: 8.5 % — ABNORMAL HIGH (ref <5.7)
Mean Plasma Glucose: 197 mg/dL — ABNORMAL HIGH (ref <117)

## 2013-12-19 NOTE — Patient Instructions (Signed)
Diabetes Meal Planning Guide The diabetes meal planning guide is a tool to help you plan your meals and snacks. It is important for people with diabetes to manage their blood glucose (sugar) levels. Choosing the right foods and the right amounts throughout your day will help control your blood glucose. Eating right can even help you improve your blood pressure and reach or maintain a healthy weight. CARBOHYDRATE COUNTING MADE EASY When you eat carbohydrates, they turn to sugar. This raises your blood glucose level. Counting carbohydrates can help you control this level so you feel better. When you plan your meals by counting carbohydrates, you can have more flexibility in what you eat and balance your medicine with your food intake. Carbohydrate counting simply means adding up the total amount of carbohydrate grams in your meals and snacks. Try to eat about the same amount at each meal. Foods with carbohydrates are listed below. Each portion below is 1 carbohydrate serving or 15 grams of carbohydrates. Ask your dietician how many grams of carbohydrates you should eat at each meal or snack. Grains and Starches  1 slice bread.   English muffin or hotdog/hamburger bun.   cup cold cereal (unsweetened).   cup cooked pasta or rice.   cup starchy vegetables (corn, potatoes, peas, beans, winter squash).  1 tortilla (6 inches).   bagel.  1 waffle or pancake (size of a CD).   cup cooked cereal.  4 to 6 small crackers. *Whole grain is recommended. Fruit  1 cup fresh unsweetened berries, melon, papaya, pineapple.  1 small fresh fruit.   banana or mango.   cup fruit juice (4 oz unsweetened).   cup canned fruit in natural juice or water.  2 tbs dried fruit.  12 to 15 grapes or cherries. Milk and Yogurt  1 cup fat-free or 1% milk.  1 cup soy milk.  6 oz light yogurt with sugar-free sweetener.  6 oz low-fat soy yogurt.  6 oz plain yogurt. Vegetables  1 cup raw or  cup  cooked is counted as 0 carbohydrates or a "free" food.  If you eat 3 or more servings at 1 meal, count them as 1 carbohydrate serving. Other Carbohydrates   oz chips or pretzels.   cup ice cream or frozen yogurt.   cup sherbet or sorbet.  2 inch square cake, no frosting.  1 tbs honey, sugar, jam, jelly, or syrup.  2 small cookies.  3 squares of graham crackers.  3 cups popcorn.  6 crackers.  1 cup broth-based soup.  Count 1 cup casserole or other mixed foods as 2 carbohydrate servings.  Foods with less than 20 calories in a serving may be counted as 0 carbohydrates or a "free" food. You may want to purchase a book or computer software that lists the carbohydrate gram counts of different foods. In addition, the nutrition facts panel on the labels of the foods you eat are a good source of this information. The label will tell you how big the serving size is and the total number of carbohydrate grams you will be eating per serving. Divide this number by 15 to obtain the number of carbohydrate servings in a portion. Remember, 1 carbohydrate serving equals 15 grams of carbohydrate. SERVING SIZES Measuring foods and serving sizes helps you make sure you are getting the right amount of food. The list below tells how big or small some common serving sizes are.  1 oz.........4 stacked dice.  3 oz.........Deck of cards.  1 tsp........Tip   of little finger.  1 tbs........Thumb.  2 tbs........Golf ball.   cup.......Half of a fist.  1 cup........A fist. SAMPLE DIABETES MEAL PLAN Below is a sample meal plan that includes foods from the grain and starches, dairy, vegetable, fruit, and meat groups. A dietician can individualize a meal plan to fit your calorie needs and tell you the number of servings needed from each food group. However, controlling the total amount of carbohydrates in your meal or snack is more important than making sure you include all of the food groups at every  meal. You may interchange carbohydrate containing foods (dairy, starches, and fruits). The meal plan below is an example of a 2000 calorie diet using carbohydrate counting. This meal plan has 17 carbohydrate servings. Breakfast  1 cup oatmeal (2 carb servings).   cup light yogurt (1 carb serving).  1 cup blueberries (1 carb serving).   cup almonds. Snack  1 large apple (2 carb servings).  1 low-fat string cheese stick. Lunch  Chicken breast salad.  1 cup spinach.   cup chopped tomatoes.  2 oz chicken breast, sliced.  2 tbs low-fat Italian dressing.  12 whole-wheat crackers (2 carb servings).  12 to 15 grapes (1 carb serving).  1 cup low-fat milk (1 carb serving). Snack  1 cup carrots.   cup hummus (1 carb serving). Dinner  3 oz broiled salmon.  1 cup brown rice (3 carb servings). Snack  1  cups steamed broccoli (1 carb serving) drizzled with 1 tsp olive oil and lemon juice.  1 cup light pudding (2 carb servings). DIABETES MEAL PLANNING WORKSHEET Your dietician can use this worksheet to help you decide how many servings of foods and what types of foods are right for you.  BREAKFAST Food Group and Servings / Carb Servings Grain/Starches __________________________________ Dairy __________________________________________ Vegetable ______________________________________ Fruit ___________________________________________ Meat __________________________________________ Fat ____________________________________________ LUNCH Food Group and Servings / Carb Servings Grain/Starches ___________________________________ Dairy ___________________________________________ Fruit ____________________________________________ Meat ___________________________________________ Fat _____________________________________________ DINNER Food Group and Servings / Carb Servings Grain/Starches ___________________________________ Dairy  ___________________________________________ Fruit ____________________________________________ Meat ___________________________________________ Fat _____________________________________________ SNACKS Food Group and Servings / Carb Servings Grain/Starches ___________________________________ Dairy ___________________________________________ Vegetable _______________________________________ Fruit ____________________________________________ Meat ___________________________________________ Fat _____________________________________________ DAILY TOTALS Starches _________________________ Vegetable ________________________ Fruit ____________________________ Dairy ____________________________ Meat ____________________________ Fat ______________________________ Document Released: 08/10/2005 Document Revised: 02/05/2012 Document Reviewed: 06/21/2009 Exit[employer] Patient Information 2014 [employer]. Diabetes and Exercise Exercising regularly is important. It is not just about losing weight. It has many health benefits, such as:  Improving your overall fitness, flexibility, and endurance.  Increasing your bone density.  Helping with weight control.  Decreasing your body fat.  Increasing your muscle strength.  Reducing stress and tension.  Improving your overall health. People with diabetes who exercise gain additional benefits because exercise:  Reduces appetite.  Improves the body's use of blood sugar (glucose).  Helps lower or control blood glucose.  Decreases blood pressure.  Helps control blood lipids (such as cholesterol and triglycerides).  Improves the body's use of the hormone insulin by:  Increasing the body's insulin sensitivity.  Reducing the body's insulin needs.  Decreases the risk for heart disease because exercising:  Lowers cholesterol and triglycerides levels.  Increases the levels of good cholesterol (such as high-density lipoproteins [HDL]) in the  body.  Lowers blood glucose levels. YOUR ACTIVITY PLAN  Choose an activity that you enjoy and set realistic goals. Your health care provider or diabetes educator can help you make an activity plan that works for you. You   can break activities into 2 or 3 sessions throughout the day. Doing so is as good as one long session. Exercise ideas include:  Taking the dog for a walk.  Taking the stairs instead of the elevator.  Dancing to your favorite song.  Doing your favorite exercise with a friend. RECOMMENDATIONS FOR EXERCISING WITH TYPE 1 OR TYPE 2 DIABETES   Check your blood glucose before exercising. If blood glucose levels are greater than 240 mg/dL, check for urine ketones. Do not exercise if ketones are present.  Avoid injecting insulin into areas of the body that are going to be exercised. For example, avoid injecting insulin into:  The arms when playing tennis.  The legs when jogging.  Keep a record of:  Food intake before and after you exercise.  Expected peak times of insulin action.  Blood glucose levels before and after you exercise.  The type and amount of exercise you have done.  Review your records with your health care provider. Your health care provider will help you to develop guidelines for adjusting food intake and insulin amounts before and after exercising.  If you take insulin or oral hypoglycemic agents, watch for signs and symptoms of hypoglycemia. They include:  Dizziness.  Shaking.  Sweating.  Chills.  Confusion.  Drink plenty of water while you exercise to prevent dehydration or heat stroke. Body water is lost during exercise and must be replaced.  Talk to your health care provider before starting an exercise program to make sure it is safe for you. Remember, almost any type of activity is better than none. Document Released: 02/03/2004 Document Revised: 07/16/2013 Document Reviewed: 04/22/2013 Exit[employer] Patient Information 2014 [employer].  

## 2013-12-19 NOTE — Progress Notes (Signed)
Subjective:    Patient ID: Jody Nguyen, female    DOB: 07-14-1956, 58 y.o.   MRN: [id number]  HPI Comments: 58 yo AAF  Noncompliant presents for 3 month F/U for HTN, Cholesterol, DM, D. deficient BP 150s/ 80s at home. She denies any new stresses. She did have coffee on the way over. She notes she feels fine. She is not exercising, but keeps busy. She eats descent but too much. She denies sodium increase. She does note drinking too much soda. BS have been 120s. She is still urinating a lot with Invokana. She thinks she may have yeast infection. LAST LABS T 349 TG 148 L 273 A1C 8.7 MAG 1.6  She notes she was out of Crestor before last lab and thinks that was reason for elevation. She stopped Zetia when [employer] arrived from mail order and wants to see if labs correct with just crestor.  Hypertension  Diabetes  Hyperlipidemia   Current Outpatient Prescriptions on File Prior to Visit  Medication Sig Dispense Refill  . aspirin 81 MG tablet Take 81 mg by mouth daily.        Marland Kitchen BAYER CONTOUR TEST test strip Use as directed      . Canagliflozin (INVOKANA) 300 MG TABS Take 1 tablet by mouth daily.      . Cholecalciferol (VITAMIN D-3) 5000 UNITS TABS Take 1 tablet by mouth daily.      . cloNIDine (CATAPRES - DOSED IN MG/24 HR) 0.2 mg/24hr patch 1 patch every 7 (seven) days.      . furosemide (LASIX) 80 MG tablet Take 80 mg by mouth 2 (two) times daily.       . insulin detemir (LEVEMIR) 100 UNIT/ML injection Inject 20 Units into the skin every morning.       . labetalol (NORMODYNE) 200 MG tablet Take 1 tablet (200 mg total) by mouth 2 (two) times daily.  180 tablet  4  . Lancets MISC Use as directed      . losartan (COZAAR) 100 MG tablet Take 100 mg by mouth 2 (two) times daily.      . Magnesium 250 MG TABS Take 1 tablet by mouth daily.      . minoxidil (LONITEN) 10 MG tablet 10 mg daily.       . minoxidil (LONITEN) 2.5 MG tablet Take 2 tablets (5 mg total) by mouth daily.  180 tablet  3  .  potassium chloride SA (K-DUR,KLOR-CON) 20 MEQ tablet Take 20 mEq by mouth daily.        . rosuvastatin (CRESTOR) 20 MG tablet Take 20 mg by mouth daily.        . famotidine (PEPCID) 40 MG tablet Take 1 tablet (40 mg total) by mouth daily.  10 tablet  0   No current facility-administered medications on file prior to visit.   ALLERGIES Ace inhibitors and Lipitor  Past Medical History  Diagnosis Date  . Obesity   . Elevated cholesterol   . Vitamin D deficiency   . DM (diabetes mellitus)   . Hypertension   . CVA (cerebral infarction) 1997    no residual deficit  . Anemia   . Renal insufficiency   . Vitamin D deficiency   . Diverticulitis       Review of Systems  All other systems reviewed and are negative.   BP 198/110  Pulse 80  Temp(Src) 98.2 F (36.8 C) (Temporal)  Resp 18  Ht 5' (1.524 m)  Wt 197  lb (89.359 kg)  BMI 38.47 kg/m2 Recheck 190/100    Objective:   Physical Exam  Nursing note and vitals reviewed. Constitutional: She is oriented to person, place, and time. She appears well-developed and well-nourished. No distress.  obese  HENT:  Head: Normocephalic and atraumatic.  Right Ear: External ear normal.  Left Ear: External ear normal.  Nose: Nose normal.  Mouth/Throat: Oropharynx is clear and moist.  Eyes: Conjunctivae and EOM are normal.  Neck: Normal range of motion. Neck supple. No JVD present. No thyromegaly present.  Cardiovascular: Normal rate, regular rhythm, normal heart sounds and intact distal pulses.   Pulmonary/Chest: Effort normal and breath sounds normal.  Abdominal: Soft. Bowel sounds are normal. She exhibits no distension and no mass. There is no tenderness. There is no rebound and no guarding.  Musculoskeletal: Normal range of motion. She exhibits no edema and no tenderness.  Lymphadenopathy:    She has no cervical adenopathy.  Neurological: She is alert and oriented to person, place, and time. No cranial nerve deficit.  Skin: Skin is  warm and dry. No rash noted. No erythema. No pallor.  Psychiatric: She has a normal mood and affect. Her behavior is normal. Judgment and thought content normal.          Assessment & Plan:  1.  3 month F/U for HTN, Cholesterol,DM, D. Deficient. Needs healthy diet, cardio QD and obtain healthy weight. Check Labs, Check BP if >130/80 call office, Check BS if >200 call office. Replace Losartan with AZOR 5/20 may take BID call with results of edema. IF BP stays > 190/100 ER SX #28. Decreased soda.

## 2013-12-22 ENCOUNTER — Other Ambulatory Visit: Payer: Self-pay | Admitting: Physician Assistant

## 2013-12-22 ENCOUNTER — Other Ambulatory Visit: Payer: Self-pay | Admitting: *Deleted

## 2013-12-22 MED ORDER — METFORMIN HCL 1000 MG PO TABS: 1000.0000 mg | ORAL_TABLET | Freq: Two times a day (BID) | ORAL | Status: DC

## 2013-12-22 MED ORDER — POTASSIUM CHLORIDE CRYS ER 20 MEQ PO TBCR: 20.0000 meq | EXTENDED_RELEASE_TABLET | Freq: Every day | ORAL | Status: DC

## 2014-01-19 ENCOUNTER — Telehealth: Payer: Self-pay | Admitting: *Deleted

## 2014-01-19 NOTE — Telephone Encounter (Signed)
Pt is calling says that she was ordered o2 last year & has been trying to get the company to pick up the o2 but they wont without a order to d/c o2. Pt said can we please do what needs to be done for them to come get the o2? The company is [employer]

## 2014-01-21 NOTE — Telephone Encounter (Signed)
Please send note fax to [employer] to d/c O2

## 2014-03-03 ENCOUNTER — Other Ambulatory Visit: Payer: Self-pay | Admitting: *Deleted

## 2014-03-03 DIAGNOSIS — N289 Disorder of kidney and ureter, unspecified: Secondary | ICD-10-CM

## 2014-03-03 DIAGNOSIS — D649 Anemia, unspecified: Secondary | ICD-10-CM

## 2014-03-03 MED ORDER — FUROSEMIDE 80 MG PO TABS
80.0000 mg | ORAL_TABLET | Freq: Three times a day (TID) | ORAL | Status: DC
Start: 2014-03-03 — End: 2014-11-24

## 2014-03-03 MED ORDER — CANAGLIFLOZIN 300 MG PO TABS: 1.0000 | ORAL_TABLET | Freq: Every day | ORAL | Status: DC

## 2014-03-25 ENCOUNTER — Encounter: Payer: Self-pay | Admitting: Internal Medicine

## 2014-03-25 ENCOUNTER — Ambulatory Visit (INDEPENDENT_AMBULATORY_CARE_PROVIDER_SITE_OTHER): Payer: BC Managed Care – PPO | Admitting: Internal Medicine

## 2014-03-25 VITALS — BP 132/86 | HR 88 | Temp 97.5°F | Resp 16 | Ht 59.75 in | Wt 191.8 lb

## 2014-03-25 DIAGNOSIS — R7402 Elevation of levels of lactic acid dehydrogenase (LDH): Secondary | ICD-10-CM

## 2014-03-25 DIAGNOSIS — Z111 Encounter for screening for respiratory tuberculosis: Secondary | ICD-10-CM

## 2014-03-25 DIAGNOSIS — Z0000 Encounter for general adult medical examination without abnormal findings: Secondary | ICD-10-CM

## 2014-03-25 DIAGNOSIS — I10 Essential (primary) hypertension: Secondary | ICD-10-CM

## 2014-03-25 DIAGNOSIS — Z1212 Encounter for screening for malignant neoplasm of rectum: Secondary | ICD-10-CM

## 2014-03-25 DIAGNOSIS — R740 Nonspecific elevation of levels of transaminase and lactic acid dehydrogenase [LDH]: Secondary | ICD-10-CM

## 2014-03-25 DIAGNOSIS — E559 Vitamin D deficiency, unspecified: Secondary | ICD-10-CM

## 2014-03-25 DIAGNOSIS — Z113 Encounter for screening for infections with a predominantly sexual mode of transmission: Secondary | ICD-10-CM

## 2014-03-25 DIAGNOSIS — Z79899 Other long term (current) drug therapy: Secondary | ICD-10-CM

## 2014-03-25 DIAGNOSIS — E1129 Type 2 diabetes mellitus with other diabetic kidney complication: Secondary | ICD-10-CM

## 2014-03-25 DIAGNOSIS — R7401 Elevation of levels of liver transaminase levels: Secondary | ICD-10-CM

## 2014-03-25 LAB — CBC WITH DIFFERENTIAL/PLATELET
Basophils Absolute: 0 10*3/uL (ref 0.0–0.1)
Basophils Relative: 1 % (ref 0–1)
Eosinophils Absolute: 0.1 10*3/uL (ref 0.0–0.7)
Eosinophils Relative: 2 % (ref 0–5)
HCT: 38.7 % (ref 36.0–46.0)
Hemoglobin: 12.6 g/dL (ref 12.0–15.0)
Lymphocytes Relative: 26 % (ref 12–46)
Lymphs Abs: 1.3 10*3/uL (ref 0.7–4.0)
MCH: 24.9 pg — ABNORMAL LOW (ref 26.0–34.0)
MCHC: 32.6 g/dL (ref 30.0–36.0)
MCV: 76.3 fL — ABNORMAL LOW (ref 78.0–100.0)
Monocytes Absolute: 0.3 10*3/uL (ref 0.1–1.0)
Monocytes Relative: 7 % (ref 3–12)
Neutro Abs: 3.1 10*3/uL (ref 1.7–7.7)
Neutrophils Relative %: 64 % (ref 43–77)
Platelets: 272 10*3/uL (ref 150–400)
RBC: 5.07 MIL/uL (ref 3.87–5.11)
RDW: 16.3 % — ABNORMAL HIGH (ref 11.5–15.5)
WBC: 4.9 10*3/uL (ref 4.0–10.5)

## 2014-03-25 LAB — HEMOGLOBIN A1C
Hgb A1c MFr Bld: 11.2 % — ABNORMAL HIGH (ref <5.7)
Mean Plasma Glucose: 275 mg/dL — ABNORMAL HIGH (ref <117)

## 2014-03-25 NOTE — Patient Instructions (Signed)

## 2014-03-25 NOTE — Progress Notes (Signed)
Patient ID: Jody Nguyen, female   DOB: [birth year], 58 y.o.   MRN: [id number]   Annual Screening Comprehensive Examination  This very nice 58 y.o. [name] presents for complete physical.  Patient has been followed for HTN, T1 IDDM w/Stage CKD, Hyperlipidemia, and Vitamin D Deficiency.    HTN predates since 1999. Patient's BP has been controlled at home. Today's BP: 132/86 mmHg. In 1997 she had a small TIA or CVA which resolved. Patient denies any cardiac symptoms as chest pain, palpitations, shortness of breath, dizziness or ankle swelling.   Patient's hyperlipidemia is controlled with diet and medications. Patient denies myalgias or other medication SE's. Last cholesterol last visit was 349, triglycerides 148, HDL 46 and LDL  273 with patient alleging Lipitor intolerance and she was recently put on Crestor.    Patient has Morbid Obesity (BMI 38) - admits dietary indiscretions and therefore has consequent T1 IDDM w/Stage 3 CKD diagnosed initially in 2006 with last A1c 8.7% in Oct 2014. She reports CBG's range 100-130 mg%.  Patient denies reactive hypoglycemic symptoms, visual blurring, diabetic polys, or paresthesias.   Finally, patient has history of Vitamin D Deficiency of 12 in 2008 with last vitamin D 58 in Oct 2014.   Medication Sig  . aspirin 81 MG tablet Take 81 mg by mouth daily.    Marland Kitchen BAYER CONTOUR TEST test strip Use as directed  . Canagliflozin (INVOKANA) 300 MG TABS Take 1 tablet (300 mg total) by mouth daily.  . Cholecalciferol (VITAMIN D-3) 5000 UNITS  Take 1 tablet by mouth daily.  . cloNIDine 0.2 mg/24hr patch 1 patch every 7 (seven) days.  . furosemide (LASIX) 80 MG tablet Take 1 tablet (80 mg total) by mouth 3 (three) times daily.  . insulin detemir (LEVEMIR) 100 UNIT/ML  Inject 20 Units into the skin every morning.   . labetalol (NORMODYNE) 200 MG tablet Take 1 tablet (200 mg total) by mouth 2 (two) times daily.  . Lancets MISC Use as directed  . losartan (COZAAR) 100 MG tablet  Take 100 mg by mouth 2 (two) times daily.  . Magnesium 250 MG TABS Take 1 tablet by mouth daily.  . metFORMIN (GLUCOPHAGE) 1000 MG tablet Take 1 tablet (1,000 mg total) by mouth 2 (two) times daily.  . minoxidil (LONITEN) 10 MG tablet 10 mg daily.   . minoxidil (LONITEN) 2.5 MG tablet Take 2 tablets (5 mg total) by mouth daily.  . potassium chloride SA (K-DUR) 20 MEQ tablet Take 1 tablet (20 mEq total) by mouth daily.  . rosuvastatin (CRESTOR) 20 MG tablet Take 20 mg by mouth daily.      Allergies  Allergen Reactions  . Ace Inhibitors   . Lipitor [Atorvastatin]     Past Medical History  Diagnosis Date  . Obesity   . Elevated cholesterol   . Vitamin D deficiency   . DM (diabetes mellitus)   . Hypertension   . CVA (cerebral infarction) 1997    no residual deficit  . Anemia   . Renal insufficiency   . Vitamin D deficiency   . Diverticulitis     Past Surgical History  Procedure Laterality Date  . Cesarean section      x 2    Family History  Problem Relation Age of Onset  . Heart attack Brother     MI in his 35s  . Cancer Sister     breast cancer  . Breast cancer Sister   . Stroke Mother  History  Substance Use Topics  . Smoking status: Never Smoker   . Smokeless tobacco: Never Used  . Alcohol Use: No    ROS Constitutional: Denies fever, chills, weight loss/gain, headaches, insomnia, fatigue, night sweats, and change in appetite. Eyes: Denies redness, blurred vision, diplopia, discharge, itchy, watery eyes.  ENT: Denies discharge, congestion, post nasal drip, epistaxis, sore throat, earache, hearing loss, dental pain, Tinnitus, Vertigo, Sinus pain, snoring.  Cardio: Denies chest pain, palpitations, irregular heartbeat, syncope, dyspnea, diaphoresis, orthopnea, PND, claudication, edema Respiratory: denies cough, dyspnea, DOE, pleurisy, hoarseness, laryngitis, wheezing.  Gastrointestinal: Denies dysphagia, heartburn, reflux, water brash, pain, cramps, nausea,  vomiting, bloating, diarrhea, constipation, hematemesis, melena, hematochezia, jaundice, hemorrhoids Genitourinary: Denies dysuria, frequency, urgency, nocturia, hesitancy, discharge, hematuria, flank pain Breast:Breast lumps, nipple discharge, bleeding.  Musculoskeletal: Denies arthralgia, myalgia, stiffness, Jt. Swelling, pain, limp, and strain/sprain. Skin: Denies puritis, rash, hives, warts, acne, eczema, changing in skin lesion Neuro: No weakness, tremor, incoordination, spasms, paresthesia, pain Psychiatric: Denies confusion, memory loss, sensory loss Endocrine: Denies change in weight, skin, hair change, nocturia, and paresthesia, diabetic polys, visual blurring, hyper / hypo glycemic episodes.  Heme/Lymph: No excessive bleeding, bruising, enlarged lymph nodes.   Physical Exam  BP 132/86  Pulse 88  Temp(Src) 97.5 F (36.4 C) (Temporal)  Resp 16  Ht 4' 11.75" (1.518 m)  Wt 191 lb 12.8 oz (87 kg)  BMI 37.76 kg/m2  General Appearance: Over nourished - morbidly obese and in no apparent distress. Eyes: PERRLA, EOMs, conjunctiva no swelling or erythema, normal fundi and vessels. Sinuses: No frontal/maxillary tenderness ENT/Mouth: EACs patent / TMs  nl. Nares clear without erythema, swelling, mucoid exudates. Oral hygiene is good. No erythema, swelling, or exudate. Tongue normal, non-obstructing. Tonsils not swollen or erythematous. Hearing normal.  Neck: Supple, thyroid normal. No bruits, nodes or JVD. Respiratory: Respiratory effort normal.  BS equal and clear bilateral without rales, rhonci, wheezing or stridor. Cardio: Heart sounds are normal with regular rate and rhythm and no murmurs, rubs or gallops. Peripheral pulses are normal and equal bilaterally without edema. No aortic or femoral bruits. Chest: symmetric with normal excursions and percussion. Breasts: Symmetric, without lumps, nipple discharge, retractions, or fibrocystic changes.  Abdomen: Flat, soft, with bowl sounds.  Nontender, no guarding, rebound, hernias, masses, or organomegaly.  Lymphatics: Non tender without lymphadenopathy.  Musculoskeletal: Full ROM all peripheral extremities, joint stability, 5/5 strength, and normal gait. Skin: Warm and dry without rashes, lesions, cyanosis, clubbing or  ecchymosis.  Neuro: Cranial nerves intact, reflexes equal bilaterally. Normal muscle tone, no cerebellar symptoms. Sensation intact.  Pysch: Awake and oriented X 3, normal affect, Insight and Judgment appropriate.   Assessment and Plan  1. Annual Screening Examination 2. Hypertension  3. Hyperlipidemia 4. T1 IDDM w/Stage 3 CKD (GFR 43 ml/min) 5. Vitamin D Deficiency  Continue prudent diet as discussed, weight control, BP monitoring, regular exercise, and medications. Discussed med's effects and SE's. Screening labs and tests as requested with regular follow-up as recommended.

## 2014-03-26 LAB — MICROALBUMIN / CREATININE URINE RATIO
Creatinine, Urine: 65.4 mg/dL
Microalb Creat Ratio: 1167.6 mg/g — ABNORMAL HIGH (ref 0.0–30.0)
Microalb, Ur: 76.36 mg/dL — ABNORMAL HIGH (ref 0.00–1.89)

## 2014-03-26 LAB — HEPATIC FUNCTION PANEL
ALT: 14 U/L (ref 0–35)
AST: 15 U/L (ref 0–37)
Albumin: 4 g/dL (ref 3.5–5.2)
Alkaline Phosphatase: 66 U/L (ref 39–117)
Bilirubin, Direct: 0.1 mg/dL (ref 0.0–0.3)
Indirect Bilirubin: 0.6 mg/dL (ref 0.2–1.2)
Total Bilirubin: 0.7 mg/dL (ref 0.2–1.2)
Total Protein: 7.3 g/dL (ref 6.0–8.3)

## 2014-03-26 LAB — BASIC METABOLIC PANEL WITH GFR
BUN: 23 mg/dL (ref 6–23)
CO2: 28 mEq/L (ref 19–32)
Calcium: 9.9 mg/dL (ref 8.4–10.5)
Chloride: 94 mEq/L — ABNORMAL LOW (ref 96–112)
Creat: 1.36 mg/dL — ABNORMAL HIGH (ref 0.50–1.10)
GFR, Est African American: 49 mL/min — ABNORMAL LOW
GFR, Est Non African American: 43 mL/min — ABNORMAL LOW
Glucose, Bld: 310 mg/dL — ABNORMAL HIGH (ref 70–99)
Potassium: 3.8 mEq/L (ref 3.5–5.3)
Sodium: 136 mEq/L (ref 135–145)

## 2014-03-26 LAB — RPR

## 2014-03-26 LAB — URINALYSIS, MICROSCOPIC ONLY
Bacteria, UA: NONE SEEN
Casts: NONE SEEN
Crystals: NONE SEEN
Squamous Epithelial / LPF: NONE SEEN

## 2014-03-26 LAB — HEPATITIS C ANTIBODY: HCV Ab: NEGATIVE

## 2014-03-26 LAB — LIPID PANEL
Cholesterol: 128 mg/dL (ref 0–200)
HDL: 61 mg/dL (ref >39)
LDL Cholesterol: 48 mg/dL (ref 0–99)
Total CHOL/HDL Ratio: 2.1 Ratio
Triglycerides: 94 mg/dL (ref <150)
VLDL: 19 mg/dL (ref 0–40)

## 2014-03-26 LAB — HIV ANTIBODY (ROUTINE TESTING W REFLEX): HIV 1&2 Ab, 4th Generation: NONREACTIVE

## 2014-03-26 LAB — HEPATITIS B CORE ANTIBODY, TOTAL: Hep B Core Total Ab: NONREACTIVE

## 2014-03-26 LAB — HEPATITIS A ANTIBODY, TOTAL: Hep A Total Ab: REACTIVE — AB

## 2014-03-26 LAB — VITAMIN B12: Vitamin B-12: 892 pg/mL (ref 211–911)

## 2014-03-26 LAB — HEPATITIS B SURFACE ANTIBODY,QUALITATIVE: Hep B S Ab: NEGATIVE

## 2014-03-26 LAB — MAGNESIUM: Magnesium: 1.9 mg/dL (ref 1.5–2.5)

## 2014-03-26 LAB — TSH: TSH: 2.65 u[IU]/mL (ref 0.350–4.500)

## 2014-03-26 LAB — INSULIN, FASTING: Insulin fasting, serum: 121 u[IU]/mL — ABNORMAL HIGH (ref 3–28)

## 2014-03-26 LAB — VITAMIN D 25 HYDROXY (VIT D DEFICIENCY, FRACTURES): Vit D, 25-Hydroxy: 66 ng/mL (ref 30–89)

## 2014-03-27 LAB — HEPATITIS B E ANTIBODY: Hepatitis Be Antibody: NONREACTIVE

## 2014-03-31 LAB — TB SKIN TEST
Induration: 0 mm
TB Skin Test: NEGATIVE

## 2014-04-03 ENCOUNTER — Other Ambulatory Visit: Payer: Self-pay | Admitting: *Deleted

## 2014-04-03 MED ORDER — MINOXIDIL 10 MG PO TABS: 10.0000 mg | ORAL_TABLET | Freq: Three times a day (TID) | ORAL | Status: DC

## 2014-05-18 ENCOUNTER — Other Ambulatory Visit: Payer: Self-pay | Admitting: Internal Medicine

## 2014-05-18 DIAGNOSIS — Z1231 Encounter for screening mammogram for malignant neoplasm of breast: Secondary | ICD-10-CM

## 2014-05-22 ENCOUNTER — Ambulatory Visit (HOSPITAL_COMMUNITY)
Admission: RE | Admit: 2014-05-22 | Discharge: 2014-05-22 | Disposition: A | Discharge status: Home / Self Care | Payer: BC Managed Care – PPO | Source: Ambulatory Visit | Attending: Internal Medicine | Admitting: Internal Medicine

## 2014-05-22 DIAGNOSIS — Z1231 Encounter for screening mammogram for malignant neoplasm of breast: Secondary | ICD-10-CM | POA: Insufficient documentation

## 2014-07-17 ENCOUNTER — Other Ambulatory Visit: Payer: Self-pay | Admitting: *Deleted

## 2014-07-17 DIAGNOSIS — I10 Essential (primary) hypertension: Secondary | ICD-10-CM

## 2014-07-17 MED ORDER — CLONIDINE HCL 0.2 MG/24HR TD PTWK: 0.2000 mg | MEDICATED_PATCH | TRANSDERMAL | Status: DC

## 2014-07-17 MED ORDER — POTASSIUM CHLORIDE CRYS ER 20 MEQ PO TBCR: 20.0000 meq | EXTENDED_RELEASE_TABLET | Freq: Every day | ORAL | Status: DC

## 2014-07-29 ENCOUNTER — Encounter: Payer: Self-pay | Admitting: Physician Assistant

## 2014-07-29 ENCOUNTER — Ambulatory Visit (INDEPENDENT_AMBULATORY_CARE_PROVIDER_SITE_OTHER): Payer: BC Managed Care – PPO | Admitting: Physician Assistant

## 2014-07-29 VITALS — BP 160/80 | HR 88 | Temp 98.5°F | Resp 16 | Ht 60.0 in | Wt 194.0 lb

## 2014-07-29 DIAGNOSIS — I10 Essential (primary) hypertension: Secondary | ICD-10-CM

## 2014-07-29 DIAGNOSIS — E1029 Type 1 diabetes mellitus with other diabetic kidney complication: Secondary | ICD-10-CM

## 2014-07-29 DIAGNOSIS — E559 Vitamin D deficiency, unspecified: Secondary | ICD-10-CM

## 2014-07-29 DIAGNOSIS — D649 Anemia, unspecified: Secondary | ICD-10-CM

## 2014-07-29 DIAGNOSIS — E785 Hyperlipidemia, unspecified: Secondary | ICD-10-CM

## 2014-07-29 DIAGNOSIS — Z79899 Other long term (current) drug therapy: Secondary | ICD-10-CM

## 2014-07-29 DIAGNOSIS — N289 Disorder of kidney and ureter, unspecified: Secondary | ICD-10-CM

## 2014-07-29 LAB — HEPATIC FUNCTION PANEL
ALT: 12 U/L (ref 0–35)
AST: 13 U/L (ref 0–37)
Albumin: 4.2 g/dL (ref 3.5–5.2)
Alkaline Phosphatase: 53 U/L (ref 39–117)
Bilirubin, Direct: 0.1 mg/dL (ref 0.0–0.3)
Indirect Bilirubin: 0.6 mg/dL (ref 0.2–1.2)
Total Bilirubin: 0.7 mg/dL (ref 0.2–1.2)
Total Protein: 7.6 g/dL (ref 6.0–8.3)

## 2014-07-29 LAB — BASIC METABOLIC PANEL WITH GFR
BUN: 27 mg/dL — ABNORMAL HIGH (ref 6–23)
CO2: 30 mEq/L (ref 19–32)
Calcium: 9.3 mg/dL (ref 8.4–10.5)
Chloride: 100 mEq/L (ref 96–112)
Creat: 1.61 mg/dL — ABNORMAL HIGH (ref 0.50–1.10)
GFR, Est African American: 40 mL/min — ABNORMAL LOW
GFR, Est Non African American: 35 mL/min — ABNORMAL LOW
Glucose, Bld: 139 mg/dL — ABNORMAL HIGH (ref 70–99)
Potassium: 3.5 mEq/L (ref 3.5–5.3)
Sodium: 140 mEq/L (ref 135–145)

## 2014-07-29 LAB — CBC WITH DIFFERENTIAL/PLATELET
Basophils Absolute: 0 10*3/uL (ref 0.0–0.1)
Basophils Relative: 0 % (ref 0–1)
Eosinophils Absolute: 0.3 10*3/uL (ref 0.0–0.7)
Eosinophils Relative: 5 % (ref 0–5)
HCT: 35.6 % — ABNORMAL LOW (ref 36.0–46.0)
Hemoglobin: 11.4 g/dL — ABNORMAL LOW (ref 12.0–15.0)
Lymphocytes Relative: 32 % (ref 12–46)
Lymphs Abs: 1.6 10*3/uL (ref 0.7–4.0)
MCH: 24.7 pg — ABNORMAL LOW (ref 26.0–34.0)
MCHC: 32 g/dL (ref 30.0–36.0)
MCV: 77.1 fL — ABNORMAL LOW (ref 78.0–100.0)
Monocytes Absolute: 0.4 10*3/uL (ref 0.1–1.0)
Monocytes Relative: 8 % (ref 3–12)
Neutro Abs: 2.8 10*3/uL (ref 1.7–7.7)
Neutrophils Relative %: 55 % (ref 43–77)
Platelets: 228 10*3/uL (ref 150–400)
RBC: 4.62 MIL/uL (ref 3.87–5.11)
RDW: 17.7 % — ABNORMAL HIGH (ref 11.5–15.5)
WBC: 5.1 10*3/uL (ref 4.0–10.5)

## 2014-07-29 LAB — HEMOGLOBIN A1C
Hgb A1c MFr Bld: 8 % — ABNORMAL HIGH (ref <5.7)
Mean Plasma Glucose: 183 mg/dL — ABNORMAL HIGH (ref <117)

## 2014-07-29 LAB — LIPID PANEL
Cholesterol: 207 mg/dL — ABNORMAL HIGH (ref 0–200)
HDL: 54 mg/dL (ref >39)
LDL Cholesterol: 134 mg/dL — ABNORMAL HIGH (ref 0–99)
Total CHOL/HDL Ratio: 3.8 Ratio
Triglycerides: 96 mg/dL (ref <150)
VLDL: 19 mg/dL (ref 0–40)

## 2014-07-29 LAB — MAGNESIUM: Magnesium: 1.8 mg/dL (ref 1.5–2.5)

## 2014-07-29 LAB — CORTISOL: Cortisol, Plasma: 10.9 ug/dL

## 2014-07-29 LAB — TSH: TSH: 3.67 u[IU]/mL (ref 0.350–4.500)

## 2014-07-29 NOTE — Progress Notes (Signed)
Assessment and Plan:  Hypertension: Continue medication, monitor blood pressure at home. Continue DASH diet. Cholesterol: Continue diet and exercise. Check cholesterol.  Diabetes-Continue diet and exercise. Check A1C- uncontrolled with multiple comorbidites- will take sugars 3 times a day and we will likely switch her from levemir to NPH mix, LONG discussion weight loss, will refer to DM educator Vitamin D Def- check level and continue medications.  Atypical chest pain- ? GERD versus pleurisy-, normal cardiolite 2012, no SOB, leg swelling, dizziness- + GERD- stop NSAIDS, get on Zantac, check CBC  Follow up in 1-2 weeks with sugars, will switch meds Continue diet and meds as discussed. Further disposition pending results of labs. Discussed med's effects and SE's.    HPI 58 y.o. female  presents for 3 month follow up with hypertension, hyperlipidemia, diabetes and vitamin D. Her blood pressure has not been controlled at home, she states it is very labile, negative RAS 2013, she is on Minoxidil 10 TID, Lasix 80mg  TID, Losartan 100 BID, Labetolol 200mg  TID, clonidine patch 0.2, today their BP is BP: 160/80 mmHg She does not workout, occ she will try to walk 10-15 mins. She denies chest pain, shortness of breath, dizziness.  She is on cholesterol medication and denies myalgias. Her cholesterol is at goal. The cholesterol last visit was:   Lab Results  Component Value Date   CHOL 128 03/25/2014   HDL 61 03/25/2014   LDLCALC 48 03/25/2014   TRIG 94 03/25/2014   CHOLHDL 2.1 03/25/2014   She has been working on diet and exercise for Diabetes, she is on Invokana 300mg , Metformin 2000mg , Levemir 20units QHS, she has had a CVA in the past, and she has CKD stage III from her DM, and denies polydipsia, polyuria and visual disturbances. Last A1C in the office was:  Lab Results  Component Value Date   HGBA1C 11.2* 03/25/2014   Patient is on Vitamin D supplement. Lab Results  Component Value Date   VD25OH  66 03/25/2014     For the past 3 weeks she has had intermittent left sided chest pain with a deep breath, denies SOB. Cough for 1-2 days, some clear mucus.   Current Medications:  Current Outpatient Prescriptions on File Prior to Visit  Medication Sig Dispense Refill  . aspirin 81 MG tablet Take 81 mg by mouth daily.        Marland Kitchen BAYER CONTOUR TEST test strip Use as directed      . Canagliflozin (INVOKANA) 300 MG TABS Take 1 tablet (300 mg total) by mouth daily.  90 tablet  1  . Cholecalciferol (VITAMIN D-3) 5000 UNITS TABS Take 1 tablet by mouth daily.      . cloNIDine (CATAPRES - DOSED IN MG/24 HR) 0.2 mg/24hr patch Place 1 patch (0.2 mg total) onto the skin every 7 (seven) days.  4 patch  2  . furosemide (LASIX) 80 MG tablet Take 1 tablet (80 mg total) by mouth 3 (three) times daily.  270 tablet  1  . insulin detemir (LEVEMIR) 100 UNIT/ML injection Inject 20 Units into the skin every morning.       . labetalol (NORMODYNE) 200 MG tablet Take 1 tablet (200 mg total) by mouth 2 (two) times daily.  180 tablet  4  . Lancets MISC Use as directed      . losartan (COZAAR) 100 MG tablet Take 100 mg by mouth 2 (two) times daily.      . Magnesium 250 MG TABS Take 1 tablet by  mouth daily.      . metFORMIN (GLUCOPHAGE) 1000 MG tablet Take 1 tablet (1,000 mg total) by mouth 2 (two) times daily.  180 tablet  2  . minoxidil (LONITEN) 10 MG tablet Take 1 tablet (10 mg total) by mouth 3 (three) times daily.  270 tablet  4  . potassium chloride SA (K-DUR,KLOR-CON) 20 MEQ tablet Take 1 tablet (20 mEq total) by mouth daily.  90 tablet  2  . rosuvastatin (CRESTOR) 20 MG tablet Take 20 mg by mouth daily.         No current facility-administered medications on file prior to visit.   Medical History:  Past Medical History  Diagnosis Date  . Obesity   . Elevated cholesterol   . Vitamin D deficiency   . DM (diabetes mellitus)   . Hypertension   . CVA (cerebral infarction) 1997    no residual deficit  . Anemia    . Renal insufficiency   . Vitamin D deficiency   . Diverticulitis    Allergies:  Allergies  Allergen Reactions  . Ace Inhibitors   . Lipitor [Atorvastatin]     Review of Systems: [X]  = complains of  [ ]  = denies  General: Fatigue [ ]  Fever [ ]  Chills [ ]  Weakness [ ]   Insomnia [ ]  Eyes: Redness [ ]  Blurred vision [ ]  Diplopia [ ]   ENT: Congestion [ ]  Sinus Pain [ ]  Post Nasal Drip [ ]  Sore Throat [ ]  Earache [ ]   Cardiac: Chest pain/pressure [x ] SOB [ ]  Orthopnea [ ]   Palpitations [ ]   Paroxysmal nocturnal dyspnea[ ]  Claudication [ ]  Edema [ ]   Pulmonary: Cough [ ]  Wheezing[ ]   SOB [ ]   Snoring [ ]   GI: Nausea [ ]  Vomiting[ ]  Dysphagia[ ]  Heartburn[x ] Abdominal pain [ ]  Constipation [ ] ; Diarrhea [ ] ; BRBPR [ ]  Melena[ ]  GU: Hematuria[ ]  Dysuria [ ]  Nocturia[ ]  Urgency [ ]   Hesitancy [ ]  Discharge [ ]  Neuro: Headaches[ ]  Vertigo[ ]  Paresthesias[ ]  Spasm [ ]  Speech changes [ ]  Incoordination [ ]   Ortho: Arthritis [ ]  Joint pain [ ]  Muscle pain [ ]  Joint swelling [ ]  Back Pain [ ]  Skin:  Rash [ ]   Pruritis [ ]  Change in skin lesion [ ]   Psych: Depression[ ]  Anxiety[ ]  Confusion [ ]  Memory loss [ ]   Heme/Lypmh: Bleeding [ ]  Bruising [ ]  Enlarged lymph nodes [ ]   Endocrine: Visual blurring [ ]  Paresthesia [ ]  Polyuria [ ]  Polydypsea [ ]    Heat/cold intolerance [ ]  Hypoglycemia [ ]   Family history- Review and unchanged Social history- Review and unchanged Physical Exam: BP 160/80  Pulse 88  Temp(Src) 98.5 F (36.9 C)  Resp 16  Ht 5' (1.524 m)  Wt 194 lb (87.998 kg)  BMI 37.89 kg/m2 Wt Readings from Last 3 Encounters:  07/29/14 194 lb (87.998 kg)  03/25/14 191 lb 12.8 oz (87 kg)  12/19/13 197 lb (89.359 kg)   General Appearance: Well nourished, obese, in no apparent distress. Eyes: PERRLA, EOMs, conjunctiva no swelling or erythema Sinuses: No Frontal/maxillary tenderness ENT/Mouth: Ext aud canals clear, TMs without erythema, bulging. No erythema, swelling, or exudate on  post pharynx.  Tonsils not swollen or erythematous. Hearing normal.  Neck: Supple, thyroid normal.  Respiratory: Respiratory effort normal, BS equal bilaterally without rales, rhonchi, wheezing or stridor.  Cardio: RRR with no MRGs. Brisk peripheral pulses without edema.  Abdomen: Soft, + BS, obese,  some mild epigastric tenderness, no guarding, rebound, hernias, masses. Lymphatics: Non tender without lymphadenopathy.  Musculoskeletal: Full ROM, 5/5 strength, normal gait.  Skin: Warm, dry without rashes, lesions, ecchymosis.  Neuro: Cranial nerves intact. No cerebellar symptoms. Sensation intact.  Psych: Awake and oriented X 3, normal affect, Insight and Judgment appropriate.    Vicie Mutters 8:50 AM

## 2014-07-29 NOTE — Patient Instructions (Signed)
If your morning sugar is always below 100 then the issue is with your sugar spiking after meals. Try to take your blood sugar approximately 2 hours after eating, this number should be less than 200. If it is not, think about the foods that you ate and better choices you can make.   Please take your sugar 3 times a day, once in the morning and once 2 hours after lunch and 2 hours after dinner. Please bring these readings back to me and we will start an insulin.     Bad carbs also include fruit juice, alcohol, and sweet tea. These are empty calories that do not signal to your brain that you are full.   Please remember the good carbs are still carbs which convert into sugar. So please measure them out no more than 1/2-1 cup of rice, oatmeal, pasta, and beans.  Veggies are however free foods! Pile them on.   I like lean protein at every meal such as chicken, [place], pork chops, cottage cheese, etc. Just do not fry these meats and please center your meal around vegetable, the meats should be a side dish.   No all fruit is created equal. Please see the list below, the fruit at the bottom is higher in sugars than the fruit at the top

## 2014-07-30 LAB — VITAMIN D 25 HYDROXY (VIT D DEFICIENCY, FRACTURES): Vit D, 25-Hydroxy: 61 ng/mL (ref 30–89)

## 2014-08-04 ENCOUNTER — Encounter: Payer: Self-pay | Admitting: Physician Assistant

## 2014-08-04 ENCOUNTER — Ambulatory Visit (INDEPENDENT_AMBULATORY_CARE_PROVIDER_SITE_OTHER): Payer: BC Managed Care – PPO | Admitting: Physician Assistant

## 2014-08-04 VITALS — BP 142/82 | HR 88 | Temp 97.9°F | Resp 16 | Ht 60.0 in | Wt 192.0 lb

## 2014-08-04 DIAGNOSIS — E1029 Type 1 diabetes mellitus with other diabetic kidney complication: Secondary | ICD-10-CM

## 2014-08-04 MED ORDER — INSULIN ASPART PROT & ASPART (70-30 MIX) 100 UNIT/ML PEN: 20.0000 [IU] | PEN_INJECTOR | Freq: Two times a day (BID) | SUBCUTANEOUS | Status: DC

## 2014-08-04 MED ORDER — GLUCOSE BLOOD VI STRP: ORAL_STRIP | Status: DC

## 2014-08-04 NOTE — Progress Notes (Signed)
Diabetes Education and Follow-Up Visit  58 y.o.female presents for diabetic education/medication adjustment. She has DMII and stage III CKD. She complains of none. The patient is checking her sugars at home with freestyle lite.   Home BG Monitoring:  Checking 2 times a day. Average:  160  High: 190  Low:  140  Low fat/carbohydrate diet?  Yes Nicotine Abuse?  No Medication Compliance?  Yes Exercise?  Yes she is walking daily Alcohol Abuse?  No DEE April 2015  ROS: no polyuria or polydipsia, no chest pain, dyspnea or TIA's, no numbness, tingling or pain in extremities  Physical Exam: Blood pressure 142/82, pulse 88, temperature 97.9 F (36.6 C), resp. rate 16, height 5' (1.524 m), weight 192 lb (87.091 kg). Body mass index is 37.5 kg/(m^2). General Appearance:  alert, oriented, no acute distress and obese heart sounds regular rate and rhythm, S1, S2 normal, no murmur, click, rub or gallop, chest clear, no hepatosplenomegaly, no carotid bruits  Labs: Lab Results  Component Value Date   HGBA1C 8.0* 07/29/2014    Lab Results  Component Value Date   MICROALBUR 76.36* 03/25/2014    Lab Results  Component Value Date   CHOL 207* 07/29/2014   HDL 54 07/29/2014   LDLCALC 134* 07/29/2014   TRIG 96 07/29/2014   CHOLHDL 3.8 07/29/2014     Assessment: 1.  Diabetes type II  2. BP is not at goal. 3. Cholesterol is not at goal.   Plan: Discussed general issues about diabetes pathophysiology and management. Neurosurgeon distributed. Addressed ADA diet. Suggested low cholesterol diet. Encouraged aerobic exercise. Discussed foot care. Reminded to get yearly retinal exam. changed insulin: to 70/30 mix. Decreased dose of metformin; see  medication orders. Reminded to bring in blood sugar diary at next visit. Follow up in 2 months or as needed.  Recommendations: 1.  Patient is counseled on appropriate foot care. 2.  BP goal < 130/80. 3.  LDL goal of < 100, HDL > 40 and TG < 150. 4.   Eye Exam yearly and Dental Exam every 6 months. 5.  Dietary recommendations 6.  Physical Activity recommendations

## 2014-08-04 NOTE — Patient Instructions (Addendum)
We are going to cut down your invokana to 100mg  daily and cut your metformin down to 1000mg  Daily due to your kidney function.    We are going to switch you to a insulin mix which you will do twice daily WITH food, this has a short acting and longer acting insulin in it.  You can do 15 units in the morning and 10 units with dinner.   If your morning sugars is above 150 for more than 3 days you can increase your NIGHT time insulin by 3 units.  Please eat a small night time snack before bed.   If your night time sugar is greater than 185 for 3 days than increase your morning insulin by 3 units.   If you experience any low blood sugars please decrease your insulin by 3 units.   Call me in 2 weeks to let me know how your sugars are running and where you are at with your insulin.     Bad carbs also include fruit juice, alcohol, and sweet tea. These are empty calories that do not signal to your brain that you are full.   Please remember the good carbs are still carbs which convert into sugar. So please measure them out no more than 1/2-1 cup of rice, oatmeal, pasta, and beans.  Veggies are however free foods! Pile them on.   I like lean protein at every meal such as chicken, [place], pork chops, cottage cheese, etc. Just do not fry these meats and please center your meal around vegetable, the meats should be a side dish.   No all fruit is created equal. Please see the list below, the fruit at the bottom is higher in sugars than the fruit at the top   If your morning sugar is always below 100 then the issue is with your sugar spiking after meals. Try to take your blood sugar approximately 2 hours after eating, this number should be less than 200. If it is not, think about the foods that you ate and better choices you can make.

## 2014-08-06 ENCOUNTER — Ambulatory Visit: Payer: Self-pay | Admitting: Physician Assistant

## 2014-08-06 ENCOUNTER — Other Ambulatory Visit: Payer: Self-pay

## 2014-08-06 MED ORDER — CANAGLIFLOZIN 100 MG PO TABS: 100.0000 mg | ORAL_TABLET | Freq: Every day | ORAL | Status: DC

## 2014-08-31 ENCOUNTER — Encounter: Payer: BC Managed Care – PPO | Attending: Physician Assistant | Admitting: *Deleted

## 2014-08-31 ENCOUNTER — Encounter: Payer: Self-pay | Admitting: *Deleted

## 2014-08-31 VITALS — Ht 59.5 in | Wt 191.8 lb

## 2014-08-31 DIAGNOSIS — E118 Type 2 diabetes mellitus with unspecified complications: Secondary | ICD-10-CM

## 2014-08-31 DIAGNOSIS — Z713 Dietary counseling and surveillance: Secondary | ICD-10-CM | POA: Diagnosis not present

## 2014-08-31 NOTE — Progress Notes (Signed)
Diabetes Self-Management Education  Visit Type:  Individual  Appt. Start Time: 0730 Appt. End Time: 0900  08/31/2014  Jody Nguyen, identified by name and date of birth, is a 58 y.o. female with a diagnosis of Diabetes: Type 2.  Other people present during visit:  Patient   ASSESSMENT  Height 4' 11.5" (1.511 m), weight 191 lb 12.8 oz (87 kg). Body mass index is 38.11 kg/(m^2).  Initial Visit Information:  Are you currently following a meal plan?: Yes What type of meal plan do you follow?: Cut out breads and simple sugars and no salt Are you taking your medications as prescribed?: Yes Are you checking your feet?: No   How often do you need to have someone help you when you read instructions, pamphlets, or other written materials from your doctor or pharmacy?: 1 - Never What is the last grade level you completed in school?: business school  Psychosocial:   Patient Belief/Attitude about Diabetes: Motivated to manage diabetes Self-care barriers: None Self-management support: Doctor's office;Internet communities;Family Other persons present: Patient Patient Concerns: Nutrition/Meal planning Special Needs: None Preferred Learning Style: Visual Learning Readiness: Ready  Complications:   Last HgB A1C per patient/outside source: 8% (improved from 11.2% 3 months earlier) How often do you check your blood sugar?: 1-2 times/day Fasting Blood glucose range (mg/dL): 70-129 Postprandial Blood glucose range (mg/dL): 130-179 Number of hypoglycemic episodes per month: 0 Number of hyperglycemic episodes per week: 0 Have you had a dilated eye exam in the past 12 months?: Yes Have you had a dental exam in the past 12 months?: No  Diet Intake:  Breakfast: coffee, yogurt, occasionally a cheese toast Snack (morning): more yogurt, fresh fruit Lunch: tuna salad with crackers, flavored water Snack (afternoon): canned peaches if hungry Dinner: lean meat, vegetables, sweet potato,  flavored water Snack (evening): fresh fruit Beverage(s): coffee, water with lemon  Exercise:  Exercise: Light (walking / raking leaves) Light Exercise amount of time (min / week): 90  Individualized Plan for Diabetes Self-Management Training:   Learning Objective:  Patient will have a greater understanding of diabetes self-management.  Patient education plan per assessed needs and concerns is to attend individual sessions for     Education Topics Reviewed with Patient Today:  Definition of diabetes, type 1 and 2, and the diagnosis of diabetes Role of diet in the treatment of diabetes and the relationship between the three main macronutrients and blood glucose level;Food label reading, portion sizes and measuring food.;Carbohydrate counting Role of exercise on diabetes management, blood pressure control and cardiac health.;Identified with patient nutritional and/or medication changes necessary with exercise.   Identified appropriate SMBG and/or A1C goals. Taught treatment of hypoglycemia - the 15 rule.        PATIENTS GOALS/Plan (Developed by the patient):  Nutrition: Follow meal plan discussed Physical Activity: Exercise 3-5 times per week Medications: take my medication as prescribed (Take insulin before meal, not afterward) Monitoring : test blood glucose pre and post meals as discussed  Plan:   Patient Instructions  Plan:  Aim for 2 Carb Choices per meal (30 grams) +/- 1 either way  Aim for 0-1 Carbs per snack if hungry  Include protein in moderation with your meals and snacks Consider reading food labels for Total Carbohydrate and Fat Grams of foods Continue with your activity level by walking for 15 minutes twice daily as tolerated Continue checking BG at alternate times per day as directed by MD  Consider taking medication 70/30 insulin before breakfast and  supper meals as directed by MD      Expected Outcomes:  Demonstrated interest in learning. Expect positive  outcomes  Education material provided: Living Well with Diabetes, Food label handouts, A1C conversion sheet, Meal plan card, Support group flyer and Carbohydrate counting sheet  If problems or questions, patient to contact team via:  Phone  Future DSME appointment: PRN

## 2014-08-31 NOTE — Patient Instructions (Signed)
Plan:  Aim for 2 Carb Choices per meal (30 grams) +/- 1 either way  Aim for 0-1 Carbs per snack if hungry  Include protein in moderation with your meals and snacks Consider reading food labels for Total Carbohydrate and Fat Grams of foods Continue with your activity level by walking for 15 minutes twice daily as tolerated Continue checking BG at alternate times per day as directed by MD  Consider taking medication 70/30 insulin before breakfast and supper meals as directed by MD

## 2014-09-14 ENCOUNTER — Ambulatory Visit: Payer: BC Managed Care – PPO | Admitting: *Deleted

## 2014-09-25 ENCOUNTER — Other Ambulatory Visit: Payer: Self-pay | Admitting: Physician Assistant

## 2014-09-25 MED ORDER — CANAGLIFLOZIN 100 MG PO TABS: 100.0000 mg | ORAL_TABLET | Freq: Every day | ORAL | Status: DC

## 2014-09-28 ENCOUNTER — Encounter: Payer: Self-pay | Admitting: *Deleted

## 2014-10-29 ENCOUNTER — Encounter: Payer: Self-pay | Admitting: Internal Medicine

## 2014-10-29 ENCOUNTER — Ambulatory Visit (INDEPENDENT_AMBULATORY_CARE_PROVIDER_SITE_OTHER): Payer: BC Managed Care – PPO | Admitting: Internal Medicine

## 2014-10-29 VITALS — BP 146/88 | HR 84 | Temp 98.1°F | Resp 16 | Ht 60.0 in | Wt 188.0 lb

## 2014-10-29 DIAGNOSIS — Z79899 Other long term (current) drug therapy: Secondary | ICD-10-CM

## 2014-10-29 DIAGNOSIS — I150 Renovascular hypertension: Secondary | ICD-10-CM

## 2014-10-29 DIAGNOSIS — E1022 Type 1 diabetes mellitus with diabetic chronic kidney disease: Secondary | ICD-10-CM

## 2014-10-29 DIAGNOSIS — E559 Vitamin D deficiency, unspecified: Secondary | ICD-10-CM

## 2014-10-29 DIAGNOSIS — E785 Hyperlipidemia, unspecified: Secondary | ICD-10-CM

## 2014-10-29 DIAGNOSIS — N189 Chronic kidney disease, unspecified: Secondary | ICD-10-CM

## 2014-10-29 LAB — HEPATIC FUNCTION PANEL
ALT: 9 U/L (ref 0–35)
AST: 12 U/L (ref 0–37)
Albumin: 4 g/dL (ref 3.5–5.2)
Alkaline Phosphatase: 49 U/L (ref 39–117)
Bilirubin, Direct: 0.1 mg/dL (ref 0.0–0.3)
Indirect Bilirubin: 0.5 mg/dL (ref 0.2–1.2)
Total Bilirubin: 0.6 mg/dL (ref 0.2–1.2)
Total Protein: 7.4 g/dL (ref 6.0–8.3)

## 2014-10-29 LAB — CBC WITH DIFFERENTIAL/PLATELET
Basophils Absolute: 0 10*3/uL (ref 0.0–0.1)
Basophils Relative: 1 % (ref 0–1)
Eosinophils Absolute: 0.2 10*3/uL (ref 0.0–0.7)
Eosinophils Relative: 5 % (ref 0–5)
HCT: 39.6 % (ref 36.0–46.0)
Hemoglobin: 12.5 g/dL (ref 12.0–15.0)
Lymphocytes Relative: 35 % (ref 12–46)
Lymphs Abs: 1.4 10*3/uL (ref 0.7–4.0)
MCH: 24.5 pg — ABNORMAL LOW (ref 26.0–34.0)
MCHC: 31.6 g/dL (ref 30.0–36.0)
MCV: 77.6 fL — ABNORMAL LOW (ref 78.0–100.0)
MPV: 10.1 fL (ref 9.4–12.4)
Monocytes Absolute: 0.3 10*3/uL (ref 0.1–1.0)
Monocytes Relative: 7 % (ref 3–12)
Neutro Abs: 2.1 10*3/uL (ref 1.7–7.7)
Neutrophils Relative %: 52 % (ref 43–77)
Platelets: 259 10*3/uL (ref 150–400)
RBC: 5.1 MIL/uL (ref 3.87–5.11)
RDW: 16.7 % — ABNORMAL HIGH (ref 11.5–15.5)
WBC: 4.1 10*3/uL (ref 4.0–10.5)

## 2014-10-29 LAB — BASIC METABOLIC PANEL WITH GFR
BUN: 24 mg/dL — ABNORMAL HIGH (ref 6–23)
CO2: 32 mEq/L (ref 19–32)
Calcium: 9.8 mg/dL (ref 8.4–10.5)
Chloride: 100 mEq/L (ref 96–112)
Creat: 1.44 mg/dL — ABNORMAL HIGH (ref 0.50–1.10)
GFR, Est African American: 46 mL/min — ABNORMAL LOW
GFR, Est Non African American: 40 mL/min — ABNORMAL LOW
Glucose, Bld: 110 mg/dL — ABNORMAL HIGH (ref 70–99)
Potassium: 3.9 mEq/L (ref 3.5–5.3)
Sodium: 141 mEq/L (ref 135–145)

## 2014-10-29 LAB — TSH: TSH: 3.596 u[IU]/mL (ref 0.350–4.500)

## 2014-10-29 LAB — HEMOGLOBIN A1C
Hgb A1c MFr Bld: 7 % — ABNORMAL HIGH (ref <5.7)
Mean Plasma Glucose: 154 mg/dL — ABNORMAL HIGH (ref <117)

## 2014-10-29 LAB — LIPID PANEL
Cholesterol: 155 mg/dL (ref 0–200)
HDL: 57 mg/dL (ref >39)
LDL Cholesterol: 81 mg/dL (ref 0–99)
Total CHOL/HDL Ratio: 2.7 Ratio
Triglycerides: 84 mg/dL (ref <150)
VLDL: 17 mg/dL (ref 0–40)

## 2014-10-29 LAB — MAGNESIUM: Magnesium: 2 mg/dL (ref 1.5–2.5)

## 2014-10-29 NOTE — Patient Instructions (Signed)

## 2014-10-29 NOTE — Progress Notes (Signed)
Patient ID: Jody Nguyen, female   DOB: 1956/03/03, 58 y.o.   MRN: [id number]   This very nice 58 y.o. [name] presents for 3 month follow up with Hypertension, Hyperlipidemia, Pre-Diabetes and Vitamin D Deficiency.    Patient is treated for HTN & BP has been controlled at home. Today's BP was 146/88 mmHg. Patient has had no complaints of any cardiac type chest pain, palpitations, dyspnea/orthopnea/PND, dizziness, claudication, or dependent edema.   Hyperlipidemia is controlled with diet & meds. Patient denies myalgias or other med SE's. Last Lipids were not at goal - Total Chol  207*; HDL  54; LDL  134*; Trig 96 on 07/29/2014.   Also, the patient has history of Morbid Obesity (BMI 36.72) and consequent T1_IDDM w/CKD4  and has had no symptoms of reactive hypoglycemia, diabetic polys, paresthesias or visual blurring. Now off Levemir & on Novolog 70/30 - taking 10 units bid. Reports CBG's mostly less than 110 mg%!  Alleges adheres to a strict diet avoiding sweets/ candy / snacks / white rice & potatoes / breads & pasta. Last A1c was  8.0% on  07/29/2014.   Further, the patient also has history of Vitamin D Deficiency of 12 in 2008 and supplements vitamin D without any suspected side-effects. Last vitamin D was  on 07/29/2014.    Medication List   canagliflozin 100 MG Tabs tablet  Commonly known as:  INVOKANA  Take 1 tablet (100 mg total) by mouth daily.     cloNIDine 0.2 mg/24hr patch  Commonly known as:  CATAPRES - Dosed in mg/24 hr  Place 1 patch (0.2 mg total) onto the skin every 7 (seven) days.     furosemide 80 MG tablet  Commonly known as:  LASIX  Take 1 tablet (80 mg total) by mouth 3 (three) times daily.     Insulin Aspart Prot & Aspart (70-30) 100 UNIT/ML Pen  Commonly known as:  NOVOLOG MIX 70/30 FLEXPEN  Inject 10 Units  2  times daily.     insulin detemir 100 UNIT/ML injection  XXXXXXXXXX  --------- OFF  Commonly known as:  LEVEMIR  Inject 20 Units into the skin every morning.     labetalol 200 MG tablet  Commonly known as:  NORMODYNE  Take 1 tablet (200 mg total) by mouth 2 (two) times daily.     losartan 100 MG tablet  Commonly known as:  COZAAR  Take 100 mg by mouth 2 (two) times daily.     Magnesium 250 MG Tabs  Take 1 tablet by mouth daily.     metFORMIN 1000 MG tablet  Commonly known as:  GLUCOPHAGE  Take 1 tablet (1,000 mg total) by mouth 2 (two) times daily.     minoxidil 10 MG tablet  Commonly known as:  LONITEN  Take 1 tablet (10 mg total) by mouth 3 (three) times daily.     potassium chloride SA 20 MEQ tablet  Commonly known as:  K-DUR,KLOR-CON  Take 1 tablet (20 mEq total) by mouth daily.     rosuvastatin 20 MG tablet  Commonly known as:  CRESTOR  Take 20 mg by mouth daily.     Vitamin D-3 5000 UNITS Tabs  Take 1 tablet by mouth daily.     Allergies  Allergen Reactions  . Ace Inhibitors   . Lipitor [Atorvastatin]    PMHx:   Past Medical History  Diagnosis Date  . Obesity   . Elevated cholesterol   . Vitamin D deficiency   . DM (  diabetes mellitus)   . Hypertension   . CVA (cerebral infarction) 1997    no residual deficit  . Anemia   . Renal insufficiency   . Vitamin D deficiency   . Diverticulitis    Immunization History  Administered Date(s) Administered  . Influenza Split 08/20/2012  . PPD Test 03/25/2014  . Pneumococcal-Unspecified 07/28/2008  . Tdap 12/28/2008   Past Surgical History  Procedure Laterality Date  . Cesarean section      x 2   FHx:    Reviewed / unchanged  SHx:    Reviewed / unchanged  Systems Review:  Constitutional: Denies fever, chills, wt changes, headaches, insomnia, fatigue, night sweats, change in appetite. Eyes: Denies redness, blurred vision, diplopia, discharge, itchy, watery eyes.  ENT: Denies discharge, congestion, post nasal drip, epistaxis, sore throat, earache, hearing loss, dental pain, tinnitus, vertigo, sinus pain, snoring.  CV: Denies chest pain, palpitations, irregular  heartbeat, syncope, dyspnea, diaphoresis, orthopnea, PND, claudication or edema. Respiratory: denies cough, dyspnea, DOE, pleurisy, hoarseness, laryngitis, wheezing.  Gastrointestinal: Denies dysphagia, odynophagia, heartburn, reflux, water brash, abdominal pain or cramps, nausea, vomiting, bloating, diarrhea, constipation, hematemesis, melena, hematochezia  or hemorrhoids. Genitourinary: Denies dysuria, frequency, urgency, nocturia, hesitancy, discharge, hematuria or flank pain. Musculoskeletal: Denies arthralgias, myalgias, stiffness, jt. swelling, pain, limping or strain/sprain.  Skin: Denies pruritus, rash, hives, warts, acne, eczema or change in skin lesion(s). Neuro: No weakness, tremor, incoordination, spasms, paresthesia or pain. Psychiatric: Denies confusion, memory loss or sensory loss. Endo: Denies change in weight, skin or hair change.  Heme/Lymph: No excessive bleeding, bruising or enlarged lymph nodes.  Physical Exam  BP 146/88   Pulse 84  Temp 98.1 F   Resp 16  Ht 5'  Wt 188 lb   BMI 36.72   Appears well nourished and in no distress. Eyes: PERRLA, EOMs, conjunctiva no swelling or erythema. Sinuses: No frontal/maxillary tenderness ENT/Mouth: EAC's clear, TM's nl w/o erythema, bulging. Nares clear w/o erythema, swelling, exudates. Oropharynx clear without erythema or exudates. Oral hygiene is good. Tongue normal, non obstructing. Hearing intact.  Neck: Supple. Thyroid nl. Car 2+/2+ without bruits, nodes or JVD. Chest: Respirations nl with BS clear & equal w/o rales, rhonchi, wheezing or stridor.  Cor: Heart sounds normal w/ regular rate and rhythm without sig. murmurs, gallops, clicks, or rubs. Peripheral pulses normal and equal  without edema.  Abdomen: Soft & bowel sounds normal. Non-tender w/o guarding, rebound, hernias, masses, or organomegaly.  Lymphatics: Unremarkable.  Musculoskeletal: Full ROM all peripheral extremities, joint stability, 5/5 strength, and normal  gait.  Skin: Warm, dry without exposed rashes, lesions or ecchymosis apparent.  Neuro: Cranial nerves intact, reflexes equal bilaterally. Sensory-motor testing grossly intact. Tendon reflexes grossly intact.  Pysch: Alert & oriented x 3.  Insight and judgement nl & appropriate. No ideations.  Assessment and Plan:  1. Hypertension - Continue monitor blood pressure at home. Continue diet/meds same.  2. Hyperlipidemia - Continue diet/meds, exercise,& lifestyle modifications. Continue monitor periodic cholesterol/liver & renal functions   3. T1_IDDM w/CKD4 - Continue diet, exercise, lifestyle modifications. Monitor appropriate labs.  4. Vitamin D Deficiency - Continue supplementation.   Recommended regular exercise, BP monitoring, weight control, and discussed med and SE's. Recommended labs to assess and monitor clinical status. Further disposition pending results of labs.

## 2014-10-30 LAB — VITAMIN D 25 HYDROXY (VIT D DEFICIENCY, FRACTURES): Vit D, 25-Hydroxy: 46 ng/mL (ref 30–100)

## 2014-11-15 ENCOUNTER — Emergency Department (HOSPITAL_COMMUNITY)
Admission: EM | Admit: 2014-11-15 | Discharge: 2014-11-15 | Disposition: A | Discharge status: Home / Self Care | Payer: BC Managed Care – PPO | Attending: Emergency Medicine | Admitting: Emergency Medicine

## 2014-11-15 ENCOUNTER — Emergency Department (HOSPITAL_COMMUNITY): Payer: BC Managed Care – PPO

## 2014-11-15 ENCOUNTER — Encounter (HOSPITAL_COMMUNITY): Payer: Self-pay | Admitting: *Deleted

## 2014-11-15 DIAGNOSIS — Z862 Personal history of diseases of the blood and blood-forming organs and certain disorders involving the immune mechanism: Secondary | ICD-10-CM | POA: Diagnosis not present

## 2014-11-15 DIAGNOSIS — Z8673 Personal history of transient ischemic attack (TIA), and cerebral infarction without residual deficits: Secondary | ICD-10-CM | POA: Insufficient documentation

## 2014-11-15 DIAGNOSIS — E559 Vitamin D deficiency, unspecified: Secondary | ICD-10-CM | POA: Diagnosis not present

## 2014-11-15 DIAGNOSIS — E669 Obesity, unspecified: Secondary | ICD-10-CM | POA: Insufficient documentation

## 2014-11-15 DIAGNOSIS — Z794 Long term (current) use of insulin: Secondary | ICD-10-CM | POA: Insufficient documentation

## 2014-11-15 DIAGNOSIS — Z79899 Other long term (current) drug therapy: Secondary | ICD-10-CM | POA: Diagnosis not present

## 2014-11-15 DIAGNOSIS — E780 Pure hypercholesterolemia: Secondary | ICD-10-CM | POA: Insufficient documentation

## 2014-11-15 DIAGNOSIS — Z7982 Long term (current) use of aspirin: Secondary | ICD-10-CM | POA: Diagnosis not present

## 2014-11-15 DIAGNOSIS — Z8742 Personal history of other diseases of the female genital tract: Secondary | ICD-10-CM | POA: Diagnosis not present

## 2014-11-15 DIAGNOSIS — I10 Essential (primary) hypertension: Secondary | ICD-10-CM | POA: Insufficient documentation

## 2014-11-15 DIAGNOSIS — M25551 Pain in right hip: Secondary | ICD-10-CM | POA: Diagnosis present

## 2014-11-15 DIAGNOSIS — Z8719 Personal history of other diseases of the digestive system: Secondary | ICD-10-CM | POA: Insufficient documentation

## 2014-11-15 DIAGNOSIS — E119 Type 2 diabetes mellitus without complications: Secondary | ICD-10-CM | POA: Diagnosis not present

## 2014-11-15 DIAGNOSIS — M5441 Lumbago with sciatica, right side: Secondary | ICD-10-CM

## 2014-11-15 DIAGNOSIS — R52 Pain, unspecified: Secondary | ICD-10-CM

## 2014-11-15 MED ORDER — CYCLOBENZAPRINE HCL 10 MG PO TABS: 10.0000 mg | ORAL_TABLET | Freq: Two times a day (BID) | ORAL | Status: DC | PRN

## 2014-11-15 MED ORDER — HYDROCODONE-ACETAMINOPHEN 5-325 MG PO TABS
1.0000 | ORAL_TABLET | Freq: Once | ORAL | Status: AC
  Administered 2014-11-15: 1 via ORAL
  Filled 2014-11-15: qty 1

## 2014-11-15 MED ORDER — ONDANSETRON 4 MG PO TBDP
4.0000 mg | ORAL_TABLET | Freq: Once | ORAL | Status: AC
  Administered 2014-11-15: 4 mg via ORAL
  Filled 2014-11-15: qty 1

## 2014-11-15 MED ORDER — NAPROXEN 500 MG PO TABS: 500.0000 mg | ORAL_TABLET | Freq: Two times a day (BID) | ORAL | Status: DC

## 2014-11-15 NOTE — ED Provider Notes (Signed)
CSN: [id number]     Arrival date & time 11/15/14  0940 History  This chart was scribed for Britt Bottom, NP, working with Evelina Bucy, MD found by Starleen Arms, ED Scribe. This patient was seen in room TR07C/TR07C and the patient's care was started at 9:46 AM.   No chief complaint on file.  The history is provided by the patient. No language interpreter was used.   HPI Comments: Jody Nguyen is a 59 y.o. female with a history of DM who presents to the Emergency Department complaining of worsening hip pain that radiates down her right leg.  She rates her pain 0/10 currently and while sitting but 10/10 when walking.  Patient reports she was seen 1.5 years ago by her orthopaedist and received an injection which relieved her pain until recently.  She does not recall a diagnosis.  She reports the pain is worsened with motion and is worse in the morning before she has "warmed up".  Patient denies history of IV drug use.  Patient denies kidney, stomach conditions.  She denies recent imaging of her back.  Patient denies fall/injury.  Patient denies bowel/bladder incontinence, saddle parasthesias, fever, night-time pain.    Past Medical History  Diagnosis Date  . Obesity   . Elevated cholesterol   . Vitamin D deficiency   . DM (diabetes mellitus)   . Hypertension   . CVA (cerebral infarction) 1997    no residual deficit  . Anemia   . Renal insufficiency   . Vitamin D deficiency   . Diverticulitis    Past Surgical History  Procedure Laterality Date  . Cesarean section      x 2   Family History  Problem Relation Age of Onset  . Heart attack Brother     MI in his 46s  . Cancer Sister     breast cancer  . Breast cancer Sister   . Stroke Mother    History  Substance Use Topics  . Smoking status: Never Smoker   . Smokeless tobacco: Never Used  . Alcohol Use: No   OB History    Gravida Para Term Preterm AB TAB SAB Ectopic Multiple Living   3 3 3             Review of Systems   Constitutional: Negative for fever.  Gastrointestinal: Negative for abdominal pain.  Musculoskeletal: Positive for back pain.  Neurological: Negative for numbness.      Allergies  Ace inhibitors and Lipitor  Home Medications   Prior to Admission medications   Medication Sig Start Date End Date Taking? Authorizing Provider  aspirin 81 MG tablet Take 81 mg by mouth daily.      Historical Provider, MD  canagliflozin (INVOKANA) 100 MG TABS tablet Take 1 tablet (100 mg total) by mouth daily. 09/25/14   Vicie Mutters, PA-C  Cholecalciferol (VITAMIN D-3) 5000 UNITS TABS Take 1 tablet by mouth daily.    Historical Provider, MD  cloNIDine (CATAPRES - DOSED IN MG/24 HR) 0.2 mg/24hr patch Place 1 patch (0.2 mg total) onto the skin every 7 (seven) days. 07/17/14   Vicie Mutters, PA-C  furosemide (LASIX) 80 MG tablet Take 1 tablet (80 mg total) by mouth 3 (three) times daily. 03/03/14   Unk Pinto, MD  glucose blood test strip Freestyle lite test strips, check sugar 3 times daily. DX 250.41 08/04/14   Vicie Mutters, PA-C  Insulin Aspart Prot & Aspart (NOVOLOG MIX 70/30 FLEXPEN) (70-30) 100 UNIT/ML Pen Inject 20 Units into  the skin 2 (two) times daily. 08/04/14   Vicie Mutters, PA-C  labetalol (NORMODYNE) 200 MG tablet Take 1 tablet (200 mg total) by mouth 2 (two) times daily. 02/12/13   Lelon Perla, MD  Lancets MISC Use as directed 02/20/13   Historical Provider, MD  losartan (COZAAR) 100 MG tablet Take 100 mg by mouth 2 (two) times daily.    Historical Provider, MD  Magnesium 250 MG TABS Take 1 tablet by mouth daily.    Historical Provider, MD  metFORMIN (GLUCOPHAGE) 1000 MG tablet Take 1 tablet (1,000 mg total) by mouth 2 (two) times daily. 12/22/13   Melissa R Smith, PA-C  minoxidil (LONITEN) 10 MG tablet Take 1 tablet (10 mg total) by mouth 3 (three) times daily. 04/03/14   Unk Pinto, MD  potassium chloride SA (K-DUR,KLOR-CON) 20 MEQ tablet Take 1 tablet (20 mEq total) by mouth daily.  07/17/14   Vicie Mutters, PA-C  rosuvastatin (CRESTOR) 20 MG tablet Take 20 mg by mouth daily.      Historical Provider, MD   There were no vitals taken for this visit. Physical Exam  Constitutional: She is oriented to person, place, and time. She appears well-developed and well-nourished. No distress.  HENT:  Head: Normocephalic and atraumatic.  Eyes: Conjunctivae and EOM are normal.  Neck: Neck supple. No tracheal deviation present.  Cardiovascular: Normal rate.   Pulmonary/Chest: Effort normal. No respiratory distress.  Musculoskeletal: Normal range of motion. She exhibits tenderness.  5/5 strength with SLR and plantarflexion and dorsiflexion.  Tenderness to palpation of lumbosacral spine.  Small amount of tenderness at SI joint.  No altered sensation distally.   Neurological: She is alert and oriented to person, place, and time.  Skin: Skin is warm and dry.  Psychiatric: She has a normal mood and affect. Her behavior is normal.  Nursing note and vitals reviewed.   ED Course  Procedures (including critical care time)  DIAGNOSTIC STUDIES:    COORDINATION OF CARE:  9:58 AM Will order pain medication, imaging and prescribe antiinflammatory, muscle relaxant.  Will refer to orthopaedist.  Patient acknowledges and agrees with plan.    Labs Review Labs Reviewed - No data to display  Imaging Review No results found.   EKG Interpretation None      MDM   Final diagnoses:  Pain  Right-sided low back pain with right-sided sciatica   58 yo with recurrent back pain. No new injury but had been doing well until recently. Her neuro exam is normal and she has no loss of bowel or bladder control or  concern for cauda equina. She deneis fever, night sweats, weight loss, h/o cancer, or IVDU. X-ray is negative for fracture. RICE protocol and pain medicine indicated and discussed with patient. Referral for ortho provided for follow-up. Pt is well-appearing, in no acute distress and vital  signs are stable.  They appear safe to be discharged.   Return precautions provided.   I personally performed the services described in this documentation, which was scribed in my presence. The recorded information has been reviewed and is accurate.   Meds given in ED:  Medications  HYDROcodone-acetaminophen (NORCO/VICODIN) 5-325 MG per tablet 1 tablet (1 tablet Oral Given 11/15/14 1011)  ondansetron (ZOFRAN-ODT) disintegrating tablet 4 mg (4 mg Oral Given 11/15/14 1020)    Discharge Medication List as of 11/15/2014 11:11 AM    START taking these medications   Details  cyclobenzaprine (FLEXERIL) 10 MG tablet Take 1 tablet (10 mg total) by mouth  2 (two) times daily as needed for muscle spasms., Starting 11/15/2014, Until Discontinued, Print    naproxen (NAPROSYN) 500 MG tablet Take 1 tablet (500 mg total) by mouth 2 (two) times daily., Starting 11/15/2014, Until Discontinued, Print          Britt Bottom, NP 11/17/14 [id number]  Evelina Bucy, MD 11/17/14 [phone]

## 2014-11-15 NOTE — ED Notes (Signed)
Declined W/C at D/C and was escorted to lobby by RN. 

## 2014-11-15 NOTE — Discharge Instructions (Signed)
Please follow the directions provided. Be sure to follow-up with the orthopedic referral given for further management of this back pain. You may use naproxen twice a day for pain and inflammation. You may use the Flexeril twice a day for muscle spasms. Don't hesitate to return for any new, worsening, or concerning symptoms.  SEEK IMMEDIATE MEDICAL CARE IF:  You have pain that radiates from your back into your legs.  You develop new bowel or bladder control problems.  You have unusual weakness or numbness in your arms or legs.  You develop nausea or vomiting.  You develop abdominal pain.  You feel faint.

## 2014-11-15 NOTE — ED Notes (Signed)
Pt reports Rt hip and leg pain for several weeks. Pt reports pain has increased.Pain 1/10 at rest but incresses to 10/10 during ambulation.

## 2014-11-17 ENCOUNTER — Other Ambulatory Visit: Payer: Self-pay | Admitting: *Deleted

## 2014-11-17 MED ORDER — ROSUVASTATIN CALCIUM 20 MG PO TABS: 20.0000 mg | ORAL_TABLET | Freq: Every day | ORAL | Status: DC

## 2014-11-22 ENCOUNTER — Encounter (HOSPITAL_COMMUNITY): Payer: Self-pay | Admitting: Nurse Practitioner

## 2014-11-22 ENCOUNTER — Emergency Department (HOSPITAL_COMMUNITY): Payer: BC Managed Care – PPO

## 2014-11-22 ENCOUNTER — Inpatient Hospital Stay (HOSPITAL_COMMUNITY)
Admission: EM | Admit: 2014-11-22 | Discharge: 2014-11-26 | DRG: 291 | Disposition: A | Discharge status: Home / Self Care | Payer: BC Managed Care – PPO | Attending: Internal Medicine | Admitting: Internal Medicine

## 2014-11-22 DIAGNOSIS — Z7982 Long term (current) use of aspirin: Secondary | ICD-10-CM | POA: Diagnosis not present

## 2014-11-22 DIAGNOSIS — N184 Chronic kidney disease, stage 4 (severe): Secondary | ICD-10-CM | POA: Diagnosis present

## 2014-11-22 DIAGNOSIS — E669 Obesity, unspecified: Secondary | ICD-10-CM | POA: Diagnosis present

## 2014-11-22 DIAGNOSIS — I5043 Acute on chronic combined systolic (congestive) and diastolic (congestive) heart failure: Secondary | ICD-10-CM

## 2014-11-22 DIAGNOSIS — I130 Hypertensive heart and chronic kidney disease with heart failure and stage 1 through stage 4 chronic kidney disease, or unspecified chronic kidney disease: Secondary | ICD-10-CM | POA: Diagnosis present

## 2014-11-22 DIAGNOSIS — D649 Anemia, unspecified: Secondary | ICD-10-CM | POA: Diagnosis present

## 2014-11-22 DIAGNOSIS — E780 Pure hypercholesterolemia: Secondary | ICD-10-CM | POA: Diagnosis present

## 2014-11-22 DIAGNOSIS — I509 Heart failure, unspecified: Secondary | ICD-10-CM

## 2014-11-22 DIAGNOSIS — Z6837 Body mass index (BMI) 37.0-37.9, adult: Secondary | ICD-10-CM

## 2014-11-22 DIAGNOSIS — Z66 Do not resuscitate: Secondary | ICD-10-CM | POA: Diagnosis present

## 2014-11-22 DIAGNOSIS — E1122 Type 2 diabetes mellitus with diabetic chronic kidney disease: Secondary | ICD-10-CM | POA: Diagnosis present

## 2014-11-22 DIAGNOSIS — M25559 Pain in unspecified hip: Secondary | ICD-10-CM | POA: Diagnosis present

## 2014-11-22 DIAGNOSIS — E1129 Type 2 diabetes mellitus with other diabetic kidney complication: Secondary | ICD-10-CM

## 2014-11-22 DIAGNOSIS — R079 Chest pain, unspecified: Secondary | ICD-10-CM

## 2014-11-22 DIAGNOSIS — J9600 Acute respiratory failure, unspecified whether with hypoxia or hypercapnia: Secondary | ICD-10-CM | POA: Diagnosis present

## 2014-11-22 DIAGNOSIS — Z8673 Personal history of transient ischemic attack (TIA), and cerebral infarction without residual deficits: Secondary | ICD-10-CM

## 2014-11-22 DIAGNOSIS — Z9119 Patient's noncompliance with other medical treatment and regimen: Secondary | ICD-10-CM | POA: Diagnosis present

## 2014-11-22 DIAGNOSIS — Z888 Allergy status to other drugs, medicaments and biological substances status: Secondary | ICD-10-CM

## 2014-11-22 DIAGNOSIS — I10 Essential (primary) hypertension: Secondary | ICD-10-CM

## 2014-11-22 DIAGNOSIS — E119 Type 2 diabetes mellitus without complications: Secondary | ICD-10-CM

## 2014-11-22 DIAGNOSIS — N189 Chronic kidney disease, unspecified: Secondary | ICD-10-CM

## 2014-11-22 DIAGNOSIS — E559 Vitamin D deficiency, unspecified: Secondary | ICD-10-CM | POA: Diagnosis present

## 2014-11-22 DIAGNOSIS — R0602 Shortness of breath: Secondary | ICD-10-CM | POA: Diagnosis present

## 2014-11-22 DIAGNOSIS — Z794 Long term (current) use of insulin: Secondary | ICD-10-CM

## 2014-11-22 HISTORY — DX: Cerebral infarction, unspecified: I63.9

## 2014-11-22 LAB — BASIC METABOLIC PANEL
Anion gap: 5 (ref 5–15)
BUN: 25 mg/dL — ABNORMAL HIGH (ref 6–23)
CO2: 27 mmol/L (ref 19–32)
Calcium: 9.4 mg/dL (ref 8.4–10.5)
Chloride: 107 mEq/L (ref 96–112)
Creatinine, Ser: 1.46 mg/dL — ABNORMAL HIGH (ref 0.50–1.10)
GFR calc Af Amer: 44 mL/min — ABNORMAL LOW (ref >90)
GFR calc non Af Amer: 38 mL/min — ABNORMAL LOW (ref >90)
Glucose, Bld: 125 mg/dL — ABNORMAL HIGH (ref 70–99)
Potassium: 3.8 mmol/L (ref 3.5–5.1)
Sodium: 139 mmol/L (ref 135–145)

## 2014-11-22 LAB — I-STAT TROPONIN, ED: Troponin i, poc: 0.03 ng/mL (ref 0.00–0.08)

## 2014-11-22 LAB — TROPONIN I: Troponin I: 0.04 ng/mL — ABNORMAL HIGH (ref <0.031)

## 2014-11-22 LAB — CBC
HCT: 37.4 % (ref 36.0–46.0)
Hemoglobin: 11.8 g/dL — ABNORMAL LOW (ref 12.0–15.0)
MCH: 24.5 pg — ABNORMAL LOW (ref 26.0–34.0)
MCHC: 31.6 g/dL (ref 30.0–36.0)
MCV: 77.6 fL — ABNORMAL LOW (ref 78.0–100.0)
Platelets: 219 10*3/uL (ref 150–400)
RBC: 4.82 MIL/uL (ref 3.87–5.11)
RDW: 17.9 % — ABNORMAL HIGH (ref 11.5–15.5)
WBC: 5 10*3/uL (ref 4.0–10.5)

## 2014-11-22 LAB — GLUCOSE, CAPILLARY: Glucose-Capillary: 122 mg/dL — ABNORMAL HIGH (ref 70–99)

## 2014-11-22 LAB — BRAIN NATRIURETIC PEPTIDE: B Natriuretic Peptide: 137.7 pg/mL — ABNORMAL HIGH (ref 0.0–100.0)

## 2014-11-22 MED ORDER — MINOXIDIL 10 MG PO TABS
10.0000 mg | ORAL_TABLET | Freq: Every day | ORAL | Status: DC
  Administered 2014-11-22 – 2014-11-24 (×3): 10 mg via ORAL
  Filled 2014-11-22 (×4): qty 1

## 2014-11-22 MED ORDER — ACETAMINOPHEN 325 MG PO TABS
650.0000 mg | ORAL_TABLET | ORAL | Status: DC | PRN
  Administered 2014-11-24: 650 mg via ORAL
  Filled 2014-11-22: qty 2

## 2014-11-22 MED ORDER — ROSUVASTATIN CALCIUM 20 MG PO TABS
20.0000 mg | ORAL_TABLET | Freq: Every day | ORAL | Status: DC
  Administered 2014-11-23 – 2014-11-24 (×2): 20 mg via ORAL
  Filled 2014-11-22 (×2): qty 1

## 2014-11-22 MED ORDER — SODIUM CHLORIDE 0.9 % IJ SOLN
3.0000 mL | Freq: Two times a day (BID) | INTRAMUSCULAR | Status: DC
  Administered 2014-11-22 – 2014-11-26 (×7): 3 mL via INTRAVENOUS

## 2014-11-22 MED ORDER — MINOXIDIL 10 MG PO TABS: 10.0000 mg | ORAL_TABLET | Freq: Three times a day (TID) | ORAL | Status: DC

## 2014-11-22 MED ORDER — INSULIN ASPART 100 UNIT/ML ~~LOC~~ SOLN: 0.0000 [IU] | Freq: Every day | SUBCUTANEOUS | Status: DC

## 2014-11-22 MED ORDER — ASPIRIN 81 MG PO CHEW
81.0000 mg | CHEWABLE_TABLET | Freq: Every day | ORAL | Status: DC
  Administered 2014-11-22 – 2014-11-26 (×5): 81 mg via ORAL
  Filled 2014-11-22 (×7): qty 1

## 2014-11-22 MED ORDER — LABETALOL HCL 200 MG PO TABS
200.0000 mg | ORAL_TABLET | Freq: Two times a day (BID) | ORAL | Status: DC
  Administered 2014-11-22 – 2014-11-26 (×8): 200 mg via ORAL
  Filled 2014-11-22 (×9): qty 1

## 2014-11-22 MED ORDER — INSULIN ASPART PROT & ASPART (70-30 MIX) 100 UNIT/ML PEN: 10.0000 [IU] | PEN_INJECTOR | Freq: Two times a day (BID) | SUBCUTANEOUS | Status: DC

## 2014-11-22 MED ORDER — INSULIN ASPART 100 UNIT/ML ~~LOC~~ SOLN
0.0000 [IU] | Freq: Three times a day (TID) | SUBCUTANEOUS | Status: DC
Start: 2014-11-23 — End: 2014-11-26
  Administered 2014-11-24 (×3): 2 [IU] via SUBCUTANEOUS
  Administered 2014-11-25: 3 [IU] via SUBCUTANEOUS
  Administered 2014-11-25 – 2014-11-26 (×2): 2 [IU] via SUBCUTANEOUS

## 2014-11-22 MED ORDER — ENOXAPARIN SODIUM 30 MG/0.3ML ~~LOC~~ SOLN
30.0000 mg | SUBCUTANEOUS | Status: DC
  Administered 2014-11-22: 30 mg via SUBCUTANEOUS
  Filled 2014-11-22 (×2): qty 0.3

## 2014-11-22 MED ORDER — FUROSEMIDE 10 MG/ML IJ SOLN
60.0000 mg | Freq: Two times a day (BID) | INTRAMUSCULAR | Status: DC
  Administered 2014-11-23 (×2): 60 mg via INTRAVENOUS
  Filled 2014-11-22 (×5): qty 6

## 2014-11-22 MED ORDER — INSULIN ASPART PROT & ASPART (70-30 MIX) 100 UNIT/ML ~~LOC~~ SUSP
10.0000 [IU] | Freq: Two times a day (BID) | SUBCUTANEOUS | Status: DC
  Administered 2014-11-23 – 2014-11-26 (×7): 10 [IU] via SUBCUTANEOUS
  Filled 2014-11-22: qty 10

## 2014-11-22 MED ORDER — ONDANSETRON HCL 4 MG/2ML IJ SOLN: 4.0000 mg | Freq: Four times a day (QID) | INTRAMUSCULAR | Status: DC | PRN

## 2014-11-22 MED ORDER — FUROSEMIDE 10 MG/ML IJ SOLN
80.0000 mg | Freq: Once | INTRAMUSCULAR | Status: AC
  Administered 2014-11-22: 80 mg via INTRAVENOUS
  Filled 2014-11-22: qty 8

## 2014-11-22 MED ORDER — SODIUM CHLORIDE 0.9 % IJ SOLN: 3.0000 mL | INTRAMUSCULAR | Status: DC | PRN

## 2014-11-22 MED ORDER — POTASSIUM CHLORIDE CRYS ER 20 MEQ PO TBCR
20.0000 meq | EXTENDED_RELEASE_TABLET | Freq: Every day | ORAL | Status: DC
  Administered 2014-11-22 – 2014-11-26 (×5): 20 meq via ORAL
  Filled 2014-11-22 (×4): qty 1

## 2014-11-22 MED ORDER — LOSARTAN POTASSIUM 50 MG PO TABS
100.0000 mg | ORAL_TABLET | Freq: Two times a day (BID) | ORAL | Status: DC
  Administered 2014-11-22 – 2014-11-23 (×3): 100 mg via ORAL
  Filled 2014-11-22 (×5): qty 2

## 2014-11-22 MED ORDER — CLONIDINE HCL 0.2 MG/24HR TD PTWK
0.2000 mg | MEDICATED_PATCH | TRANSDERMAL | Status: DC
  Administered 2014-11-24: 0.2 mg via TRANSDERMAL
  Filled 2014-11-22: qty 1

## 2014-11-22 MED ORDER — MINOXIDIL 10 MG PO TABS
20.0000 mg | ORAL_TABLET | Freq: Every morning | ORAL | Status: DC
  Administered 2014-11-23 – 2014-11-25 (×3): 20 mg via ORAL
  Filled 2014-11-22 (×3): qty 2

## 2014-11-22 MED ORDER — SODIUM CHLORIDE 0.9 % IV SOLN: 250.0000 mL | INTRAVENOUS | Status: DC | PRN

## 2014-11-22 NOTE — ED Notes (Signed)
Pt states she does not have cp or sob at this time

## 2014-11-22 NOTE — ED Provider Notes (Signed)
CSN: [id number]     Arrival date & time 11/22/14  1243 History   First MD Initiated Contact with Patient 11/22/14 1454     Chief Complaint  Patient presents with  . Shortness of Breath     (Consider location/radiation/quality/duration/timing/severity/associated sxs/prior Treatment) Patient is a 58 y.o. female presenting with shortness of breath. The history is provided by the patient.  Shortness of Breath Associated symptoms: no abdominal pain, no chest pain, no headaches, no rash and no vomiting    patient with shortness of breath the last few days. Reportedly began on Wednesday.  Has had worsening shortness of breath with lying down. No fevers. No cough. Is worse with exertion. She states she's been out of her Lasix for the last 5 days. No chest pain. She states she has more swelling or legs are normal. No cough. No hemoptysis. She has a history of hypertension but states she does not have a history of heart failure. recently had a shot of something for her back pain at the orthopedics office.   Past Medical History  Diagnosis Date  . Obesity   . Elevated cholesterol   . Vitamin D deficiency   . DM (diabetes mellitus)   . Hypertension   . CVA (cerebral infarction) 1997    no residual deficit  . Anemia   . Renal insufficiency   . Vitamin D deficiency   . Diverticulitis    Past Surgical History  Procedure Laterality Date  . Cesarean section      x 2   Family History  Problem Relation Age of Onset  . Heart attack Brother     MI in his 43s  . Cancer Sister     breast cancer  . Breast cancer Sister   . Stroke Mother    History  Substance Use Topics  . Smoking status: Never Smoker   . Smokeless tobacco: Never Used  . Alcohol Use: No   OB History    Gravida Para Term Preterm AB TAB SAB Ectopic Multiple Living   3 3 3             Review of Systems  Constitutional: Negative for activity change and appetite change.  Eyes: Negative for pain.  Respiratory: Positive for  shortness of breath. Negative for chest tightness.   Cardiovascular: Positive for leg swelling. Negative for chest pain.  Gastrointestinal: Negative for nausea, vomiting, abdominal pain and diarrhea.  Genitourinary: Negative for flank pain.  Musculoskeletal: Positive for back pain. Negative for neck stiffness.  Skin: Negative for rash.  Neurological: Negative for weakness, numbness and headaches.  Psychiatric/Behavioral: Negative for behavioral problems.      Allergies  Ace inhibitors and Lipitor  Home Medications   Prior to Admission medications   Medication Sig Start Date End Date Taking? Authorizing Provider  aspirin 81 MG tablet Take 81 mg by mouth daily.      Historical Provider, MD  canagliflozin (INVOKANA) 100 MG TABS tablet Take 1 tablet (100 mg total) by mouth daily. 09/25/14   Vicie Mutters, PA-C  Cholecalciferol (VITAMIN D-3) 5000 UNITS TABS Take 1 tablet by mouth daily.    Historical Provider, MD  cloNIDine (CATAPRES - DOSED IN MG/24 HR) 0.2 mg/24hr patch Place 1 patch (0.2 mg total) onto the skin every 7 (seven) days. 07/17/14   Vicie Mutters, PA-C  cyclobenzaprine (FLEXERIL) 10 MG tablet Take 1 tablet (10 mg total) by mouth 2 (two) times daily as needed for muscle spasms. 11/15/14   Britt Bottom, NP  furosemide (LASIX) 80 MG tablet Take 1 tablet (80 mg total) by mouth 3 (three) times daily. 03/03/14   Unk Pinto, MD  glucose blood test strip Freestyle lite test strips, check sugar 3 times daily. DX 250.41 08/04/14   Vicie Mutters, PA-C  Insulin Aspart Prot & Aspart (NOVOLOG MIX 70/30 FLEXPEN) (70-30) 100 UNIT/ML Pen Inject 20 Units into the skin 2 (two) times daily. 08/04/14   Vicie Mutters, PA-C  labetalol (NORMODYNE) 200 MG tablet Take 1 tablet (200 mg total) by mouth 2 (two) times daily. 02/12/13   Lelon Perla, MD  Lancets MISC Use as directed 02/20/13   Historical Provider, MD  losartan (COZAAR) 100 MG tablet Take 100 mg by mouth 2 (two) times daily.     Historical Provider, MD  Magnesium 250 MG TABS Take 1 tablet by mouth daily.    Historical Provider, MD  metFORMIN (GLUCOPHAGE) 1000 MG tablet Take 1 tablet (1,000 mg total) by mouth 2 (two) times daily. 12/22/13   Melissa R Smith, PA-C  minoxidil (LONITEN) 10 MG tablet Take 1 tablet (10 mg total) by mouth 3 (three) times daily. 04/03/14   Unk Pinto, MD  naproxen (NAPROSYN) 500 MG tablet Take 1 tablet (500 mg total) by mouth 2 (two) times daily. 11/15/14   Britt Bottom, NP  potassium chloride SA (K-DUR,KLOR-CON) 20 MEQ tablet Take 1 tablet (20 mEq total) by mouth daily. 07/17/14   Vicie Mutters, PA-C  rosuvastatin (CRESTOR) 20 MG tablet Take 1 tablet (20 mg total) by mouth daily. 11/17/14   Unk Pinto, MD   BP 160/96 mmHg  Pulse 91  Temp(Src) 98.4 F (36.9 C) (Oral)  Resp 19  SpO2 98% Physical Exam  Constitutional: She is oriented to person, place, and time. She appears well-developed and well-nourished.  HENT:  Head: Normocephalic and atraumatic.  Eyes: EOM are normal. Pupils are equal, round, and reactive to light.  Neck: Normal range of motion. Neck supple.  Cardiovascular: Normal rate, regular rhythm and normal heart sounds.   No murmur heard. Pulmonary/Chest: Effort normal. No respiratory distress. She has wheezes. She has rales.  Few scattered wheezes and a few scattered rales.  Abdominal: Soft. Bowel sounds are normal. She exhibits no distension. There is no tenderness. There is no rebound and no guarding.  Musculoskeletal: Normal range of motion. She exhibits edema.  Pitting edema to bilateral lower extremities.  Neurological: She is alert and oriented to person, place, and time. No cranial nerve deficit.  Skin: Skin is warm and dry.  Psychiatric: She has a normal mood and affect. Her speech is normal.  Nursing note and vitals reviewed.   ED Course  Procedures (including critical care time) Labs Review Labs Reviewed  CBC  BASIC METABOLIC PANEL  BRAIN  NATRIURETIC PEPTIDE  I-STAT [place], ED    Imaging Review Dg Chest 2 View  11/22/2014   CLINICAL DATA:  Shortness of breath 2 days with feet swelling.  EXAM: CHEST  2 VIEW  COMPARISON:  09/22/2011  FINDINGS: Lungs are adequately inflated without consolidation or effusion. There is minimal prominence of the perihilar markings likely mild vascular congestion. There is moderate cardiomegaly which is worse. Remainder of the exam is unchanged.  IMPRESSION: Worsening of moderate cardiomegaly.  Mild vascular congestion.   Electronically Signed   By: Marin Olp M.D.   On: 11/22/2014 14:48     EKG Interpretation None      EKG Interpretation  Date/Time:  Sunday November 22 2014 12:48:15 EST Ventricular Rate:  102 PR Interval:  208 QRS Duration: 92 QT Interval:  342 QTC Calculation: 445 R Axis:   -15 Text Interpretation:  Sinus tachycardia Nonspecific T wave abnormality Abnormal ECG No significant change since last tracing Confirmed by Alvino Chapel  MD, Ovid Curd [phone]) on 11/22/2014 3:09:19 PM        MDM   Final diagnoses:  Chest pain    Patient with chest pain. Shortness of breath. Continued shortness of breath with hypoxia off oxygen. Will admit.    Jasper Riling. Alvino Chapel, MD 11/25/14 2215

## 2014-11-22 NOTE — ED Notes (Signed)
Patient transported to X-ray 

## 2014-11-22 NOTE — H&P (Signed)
PCP:  Alesia Richards, MD    Chief Complaint:   Shortness of breath HPI: Jody Nguyen is a 58 y.o. female   has a past medical history of Obesity; Elevated cholesterol; Vitamin D deficiency; DM (diabetes mellitus); Hypertension; CVA (cerebral infarction) (1997); Anemia; Renal insufficiency; Vitamin D deficiency; Diverticulitis; and Stroke (1997).   Presented with  Patient has been short of breath for the past 1 week. She states this started after she had a cortizone shot for her hip pain. Patient has run out of her lasix prescription and have not taken any for the past 3 days. She have been eating some food rich in Sodium lately.  She endorsees leg edema. She needs 3 pillows to sleep but that has been unchanged for past few years. She denies any chest pain. patient was not aware of diagnosis of CHF. Last echo 2014 showing EF [id number] and diastolic dysfunction with evidence of LVH. On arrival to ER she was found to have o2 sat 90% on RA. On 2L she is sating 97%. CXR showing cardiomegaly and vascular congestion. She was given 80mg  IV of lasix with good diuresis but still has some shortness of breath. He Lasix dose has been decreased down to 80 QD from TID 1 year ago she is not sure why.   Hospitalist was called for admission for CHF  Review of Systems:    Pertinent positives include:  shortness of breath at rest.  dyspnea on exertion,Bilateral lower extremity swelling   Constitutional:  No weight loss, night sweats, Fevers, chills, fatigue, weight loss  HEENT:  No headaches, Difficulty swallowing,Tooth/dental problems,Sore throat,  No sneezing, itching, ear ache, nasal congestion, post nasal drip,  Cardio-vascular:  No chest pain, Orthopnea, PND, anasarca, dizziness, palpitations.no  GI:  No heartburn, indigestion, abdominal pain, nausea, vomiting, diarrhea, change in bowel habits, loss of appetite, melena, blood in stool, hematemesis Resp:  no No excess mucus, no productive  cough, No non-productive cough, No coughing up of blood.No change in color of mucus.No wheezing. Skin:  no rash or lesions. No jaundice GU:  no dysuria, change in color of urine, no urgency or frequency. No straining to urinate.  No flank pain.  Musculoskeletal:  No joint pain or no joint swelling. No decreased range of motion. No back pain.  Psych:  No change in mood or affect. No depression or anxiety. No memory loss.  Neuro: no localizing neurological complaints, no tingling, no weakness, no double vision, no gait abnormality, no slurred speech, no confusion  Otherwise ROS are negative except for above, 10 systems were reviewed  Past Medical History: Past Medical History  Diagnosis Date  . Obesity   . Elevated cholesterol   . Vitamin D deficiency   . DM (diabetes mellitus)   . Hypertension   . CVA (cerebral infarction) 1997    no residual deficit  . Anemia   . Renal insufficiency   . Vitamin D deficiency   . Diverticulitis   . Stroke 1997   Past Surgical History  Procedure Laterality Date  . Cesarean section      x 2     Medications: Prior to Admission medications   Medication Sig Start Date End Date Taking? Authorizing Provider  aspirin 81 MG tablet Take 81 mg by mouth daily.     Yes Historical Provider, MD  canagliflozin (INVOKANA) 100 MG TABS tablet Take 1 tablet (100 mg total) by mouth daily. 09/25/14  Yes Vicie Mutters, PA-C  Cholecalciferol (VITAMIN D-3) 5000 UNITS  TABS Take 1 tablet by mouth daily.   Yes Historical Provider, MD  cloNIDine (CATAPRES - DOSED IN MG/24 HR) 0.2 mg/24hr patch Place 1 patch (0.2 mg total) onto the skin every 7 (seven) days. 07/17/14  Yes Vicie Mutters, PA-C  cyclobenzaprine (FLEXERIL) 10 MG tablet Take 1 tablet (10 mg total) by mouth 2 (two) times daily as needed for muscle spasms. 11/15/14  Yes Britt Bottom, NP  furosemide (LASIX) 80 MG tablet Take 1 tablet (80 mg total) by mouth 3 (three) times daily. Patient taking  differently: Take 80 mg by mouth daily.  03/03/14  Yes Unk Pinto, MD  glucose blood test strip Freestyle lite test strips, check sugar 3 times daily. DX 250.41 08/04/14  Yes Vicie Mutters, PA-C  Insulin Aspart Prot & Aspart (NOVOLOG MIX 70/30 FLEXPEN) (70-30) 100 UNIT/ML Pen Inject 20 Units into the skin 2 (two) times daily. 08/04/14  Yes Vicie Mutters, PA-C  labetalol (NORMODYNE) 200 MG tablet Take 1 tablet (200 mg total) by mouth 2 (two) times daily. 02/12/13  Yes Lelon Perla, MD  Lancets MISC Use as directed 02/20/13  Yes Historical Provider, MD  losartan (COZAAR) 100 MG tablet Take 100 mg by mouth 2 (two) times daily.   Yes Historical Provider, MD  Magnesium 250 MG TABS Take 1 tablet by mouth daily.   Yes Historical Provider, MD  metFORMIN (GLUCOPHAGE) 1000 MG tablet Take 1 tablet (1,000 mg total) by mouth 2 (two) times daily. Patient taking differently: Take 1,000 mg by mouth daily with breakfast.  12/22/13  Yes Melissa R Smith, PA-C  minoxidil (LONITEN) 10 MG tablet Take 1 tablet (10 mg total) by mouth 3 (three) times daily. Patient taking differently: Take 10 mg by mouth 3 (three) times daily. Patient takes 20 mg in A and 10 mg PM 04/03/14  Yes Unk Pinto, MD  naproxen (NAPROSYN) 500 MG tablet Take 1 tablet (500 mg total) by mouth 2 (two) times daily. 11/15/14  Yes Britt Bottom, NP  potassium chloride SA (K-DUR,KLOR-CON) 20 MEQ tablet Take 1 tablet (20 mEq total) by mouth daily. 07/17/14  Yes Vicie Mutters, PA-C  rosuvastatin (CRESTOR) 20 MG tablet Take 1 tablet (20 mg total) by mouth daily. 11/17/14  Yes Unk Pinto, MD    Allergies:   Allergies  Allergen Reactions  . Ace Inhibitors   . Lipitor [Atorvastatin]     Social History:  Ambulatory  independently   Lives at home  With family     reports that she has never smoked. She has never used smokeless tobacco. She reports that she does not drink alcohol or use illicit drugs.    Family History: family history  includes Breast cancer in her sister; Cancer in her sister; Heart attack in her brother; Stroke in her mother.    Physical Exam: Patient Vitals for the past 24 hrs:  BP Temp Temp src Pulse Resp SpO2  11/22/14 1900 162/89 mmHg - - 93 21 95 %  11/22/14 1853 181/100 mmHg - - 95 24 95 %  11/22/14 1803 167/86 mmHg - - 96 22 91 %  11/22/14 1715 181/93 mmHg - - 93 20 94 %  11/22/14 1500 160/96 mmHg - - 91 19 98 %  11/22/14 1430 175/98 mmHg - - 88 20 97 %  11/22/14 1418 - - - - - 95 %  11/22/14 1416 - - - - - (!) 86 %  11/22/14 1415 171/100 mmHg - - 97 - (!) 86 %  11/22/14 1414 - - - - - (!)  83 %  11/22/14 1350 185/96 mmHg 98.4 F (36.9 C) Oral 88 16 96 %    1. General:  in No Acute distress 2. Psychological: Alert and  Oriented 3. Head/ENT:   Moist  Mucous Membranes                          Head Non traumatic, neck supple                          Normal  Dentition 4. SKIN: normal  Skin turgor,  Skin clean Dry and intact no rash 5. Heart: Regular rate and rhythm no Murmur, Rub or gallop 6. Lungs:  no wheezes some crackles  at the bases 7. Abdomen: Soft, non-tender, Non distended obese 8. Lower extremities: no clubbing, cyanosis, trace edema 9. Neurologically Grossly intact, moving all 4 extremities equally 10. MSK: Normal range of motion  body mass index is unknown because there is no weight on file.   Labs on Admission:   Results for orders placed or performed during the hospital encounter of 11/22/14 (from the past 24 hour(s))  CBC     Status: Abnormal   Collection Time: 11/22/14  2:20 PM  Result Value Ref Range   WBC 5.0 4.0 - 10.5 K/uL   RBC 4.82 3.87 - 5.11 MIL/uL   Hemoglobin 11.8 (L) 12.0 - 15.0 g/dL   HCT 37.4 36.0 - 46.0 %   MCV 77.6 (L) 78.0 - 100.0 fL   MCH 24.5 (L) 26.0 - 34.0 pg   MCHC 31.6 30.0 - 36.0 g/dL   RDW 17.9 (H) 11.5 - 15.5 %   Platelets 219 150 - 400 K/uL  Basic metabolic panel     Status: Abnormal   Collection Time: 11/22/14  2:20 PM  Result Value  Ref Range   Sodium 139 135 - 145 mmol/L   Potassium 3.8 3.5 - 5.1 mmol/L   Chloride 107 96 - 112 mEq/L   CO2 27 19 - 32 mmol/L   Glucose, Bld 125 (H) 70 - 99 mg/dL   BUN 25 (H) 6 - 23 mg/dL   Creatinine, Ser 1.46 (H) 0.50 - 1.10 mg/dL   Calcium 9.4 8.4 - 10.5 mg/dL   GFR calc non Af Amer 38 (L) >90 mL/min   GFR calc Af Amer 44 (L) >90 mL/min   Anion gap 5 5 - 15  BNP (order ONLY if patient complains of dyspnea/SOB AND you have documented it for THIS visit)     Status: Abnormal   Collection Time: 11/22/14  2:20 PM  Result Value Ref Range   B Natriuretic Peptide 137.7 (H) 0.0 - 100.0 pg/mL  I-stat troponin, ED (not at [facility])     Status: None   Collection Time: 11/22/14  2:31 PM  Result Value Ref Range   Troponin i, poc 0.03 0.00 - 0.08 ng/mL   Comment 3            UA not obtained  Lab Results  Component Value Date   HGBA1C 7.0* 10/29/2014    Estimated Creatinine Clearance: 39.9 mL/min (by C-G formula based on Cr of 1.46).  BNP (last 3 results) No results for input(s): PROBNP in the last 8760 hours.  Other results:  I have pearsonaly reviewed this: ECG REPORT  Rate: 102  Rhythm: ST ST&T Change: t wave inversions in V5 v6   There were no vitals filed for this visit.  Cultures:    Component Value Date/Time   SDES URINE, RANDOM 08/13/2008 1955   SPECREQUEST NONE 08/13/2008 1955   CULT INSIGNIFICANT GROWTH 08/13/2008 1955   REPTSTATUS 08/15/2008 FINAL 08/13/2008 1955     Radiological Exams on Admission: Dg Chest 2 View  11/22/2014   CLINICAL DATA:  Shortness of breath 2 days with feet swelling.  EXAM: CHEST  2 VIEW  COMPARISON:  09/22/2011  FINDINGS: Lungs are adequately inflated without consolidation or effusion. There is minimal prominence of the perihilar markings likely mild vascular congestion. There is moderate cardiomegaly which is worse. Remainder of the exam is unchanged.  IMPRESSION: Worsening of moderate cardiomegaly.  Mild vascular congestion.    Electronically Signed   By: Marin Olp M.D.   On: 11/22/2014 14:48    Chart has been reviewed  Assessment/Plan 58 year old female with history of diastolic heart failure and evidence of LVH per echo gram are 2014 with difficult to control hypertension and diabetes presents with CHF exacerbation in the setting of medication and diet noncompliance  Present on Admission:  . Diastolic CHF, acute on chronic - - admit on telemetry, cycle cardiac enzymes, obtain serial ECG, to evaluate for ischemia as a cause of heart failure  monitor daily weight  diurese with IV lasix and monitor orthostatics and creatinine to avoid over diuresis.  Order echogram to evaluate EF and valves Make sure patient is on ACE/ARBi  Consider cardiology consult if no significant improvement  . Hypertension - continue home medications  . CKD (chronic kidney disease) stage 2, GFR 60-89 ml/min - monitor creatinine while being diuresed, currently creatinine at baseline Diabetes mellitus - moderate sliding scale decreased dose of 70/30 while hospitalized to avoid hypoglycemia  Prophylaxis:  Lovenox  CODE STATUS:   DNR/DNI as per patient  Other plan as per orders.  I have spent a total of 55 min on this admission  Daquavion Catala 11/22/2014, 7:24 PM  [facility]  Pager [phone]   after 2 AM please page floor coverage PA If 7AM-7PM, please contact the day team taking care of the patient  [email or web address]  Password [email or web address]

## 2014-11-22 NOTE — ED Notes (Signed)
Pt monitored by pulse ox, bp cuff, and heart monitor.

## 2014-11-22 NOTE — ED Notes (Signed)
She ran out of her fluid pill Tuesday, mail ordered more but they have not come yet. She c/o SOB and BLE swelling since Tuesday. She denies pain. She is able to speak in full unlabored sentences

## 2014-11-22 NOTE — ED Notes (Signed)
Dr Alvino Chapel in to see pt

## 2014-11-23 DIAGNOSIS — I319 Disease of pericardium, unspecified: Secondary | ICD-10-CM

## 2014-11-23 LAB — BASIC METABOLIC PANEL
Anion gap: 5 (ref 5–15)
BUN: 24 mg/dL — ABNORMAL HIGH (ref 6–23)
CO2: 29 mmol/L (ref 19–32)
Calcium: 8.8 mg/dL (ref 8.4–10.5)
Chloride: 105 mEq/L (ref 96–112)
Creatinine, Ser: 1.36 mg/dL — ABNORMAL HIGH (ref 0.50–1.10)
GFR calc Af Amer: 48 mL/min — ABNORMAL LOW (ref >90)
GFR calc non Af Amer: 42 mL/min — ABNORMAL LOW (ref >90)
Glucose, Bld: 164 mg/dL — ABNORMAL HIGH (ref 70–99)
Potassium: 3.7 mmol/L (ref 3.5–5.1)
Sodium: 139 mmol/L (ref 135–145)

## 2014-11-23 LAB — HEMOGLOBIN A1C
Hgb A1c MFr Bld: 6.7 % — ABNORMAL HIGH (ref <5.7)
Mean Plasma Glucose: 146 mg/dL — ABNORMAL HIGH (ref <117)

## 2014-11-23 LAB — GLUCOSE, CAPILLARY
Glucose-Capillary: 114 mg/dL — ABNORMAL HIGH (ref 70–99)
Glucose-Capillary: 136 mg/dL — ABNORMAL HIGH (ref 70–99)
Glucose-Capillary: 105 mg/dL — ABNORMAL HIGH (ref 70–99)
Glucose-Capillary: 110 mg/dL — ABNORMAL HIGH (ref 70–99)
Glucose-Capillary: 148 mg/dL — ABNORMAL HIGH (ref 70–99)

## 2014-11-23 LAB — BRAIN NATRIURETIC PEPTIDE: B Natriuretic Peptide: 80.6 pg/mL (ref 0.0–100.0)

## 2014-11-23 LAB — TROPONIN I: Troponin I: 0.04 ng/mL — ABNORMAL HIGH (ref <0.031)

## 2014-11-23 LAB — TSH: TSH: 1.922 u[IU]/mL (ref 0.350–4.500)

## 2014-11-23 MED ORDER — HYDRALAZINE HCL 50 MG PO TABS
50.0000 mg | ORAL_TABLET | Freq: Three times a day (TID) | ORAL | Status: DC
  Administered 2014-11-23 – 2014-11-24 (×3): 50 mg via ORAL
  Filled 2014-11-23 (×6): qty 1

## 2014-11-23 MED ORDER — ENOXAPARIN SODIUM 40 MG/0.4ML ~~LOC~~ SOLN
40.0000 mg | SUBCUTANEOUS | Status: DC
  Administered 2014-11-23 – 2014-11-25 (×3): 40 mg via SUBCUTANEOUS
  Filled 2014-11-23 (×4): qty 0.4

## 2014-11-23 NOTE — Progress Notes (Signed)
Patient Demographics  Jody Nguyen, is a 58 y.o. female, DOB - March 27, 1956, [id number]  Admit date - 11/22/2014   Admitting Physician Toy Baker, MD  Outpatient Primary MD for the patient is Alesia Richards, MD  LOS - 1   Chief Complaint  Patient presents with  . Shortness of Breath        Subjective:   Jody Nguyen today has, No headache, No chest pain, No abdominal pain - No Nausea, No new weakness tingling or numbness, No Cough - improved SOB.    Assessment & Plan    1. Acute respiratory failure due to acute on chronic diastolic CHF. Last known EF 50-55%. This happened as she ran out of her Lasix, much improved after IV Lasix which will be continued, continue oxygen and nebulizer treatments as needed, salt and fluid restriction, daily weight, intake output monitoring, continue beta blocker and ARB. Increase activity try to titrate off oxygen. Follow repeat echo.   2. Essential hypertension. In poor control. On high doses of Catapres, beta blocker, minoxidil, Lasix. Added hydralazine for better control.   3. DM type II. On 7 7030 twice a day along with sliding scale. Monitor CBGs.  Lab Results  Component Value Date   HGBA1C 7.0* 10/29/2014    CBG (last 3)   Recent Labs  11/22/14 2035 11/23/14 0637 11/23/14 1040  GLUCAP 122* 114* 136*     4. CK D stage IV. Baseline creatinine 1.4. Stable.   Code Status: Full  Family Communication: husband  Disposition Plan: home   Procedures   TTE   Consults      Medications  Scheduled Meds: . aspirin  81 mg Oral Daily  . [START ON 11/24/2014] cloNIDine  0.2 mg Transdermal Q7 days  . enoxaparin (LOVENOX) injection  40 mg Subcutaneous Q24H  . furosemide  60 mg Intravenous BID  . insulin aspart  0-15  Units Subcutaneous TID WC  . insulin aspart  0-5 Units Subcutaneous QHS  . insulin aspart protamine- aspart  10 Units Subcutaneous BID WC  . labetalol  200 mg Oral BID  . losartan  100 mg Oral BID  . minoxidil  20 mg Oral q morning - 10a   And  . minoxidil  10 mg Oral QHS  . potassium chloride SA  20 mEq Oral Daily  . rosuvastatin  20 mg Oral Daily  . sodium chloride  3 mL Intravenous Q12H   Continuous Infusions:  PRN Meds:.sodium chloride, acetaminophen, ondansetron (ZOFRAN) IV, sodium chloride  DVT Prophylaxis  Lovenox   Lab Results  Component Value Date   PLT 219 11/22/2014    Antibiotics    Anti-infectives    None          Objective:   Filed Vitals:   11/23/14 0533 11/23/14 1027 11/23/14 1028 11/23/14 1031  BP: 167/88 182/97    Pulse: 72 87    Temp: 98.6 F (37 C)     TempSrc: Oral     Resp: 18     Height:      Weight: 87.674 kg (193 lb 4.6 oz)     SpO2: 96%  86% 94%    Wt Readings from Last 3 Encounters:  11/23/14 87.674 kg (193 lb 4.6 oz)  11/15/14 85.73 kg (189 lb)  10/29/14 85.276 kg (188 lb)     Intake/Output Summary (Last 24 hours) at 11/23/14 1113 Last data filed at 11/23/14 1042  Gross per 24 hour  Intake   1050 ml  Output   2701 ml  Net  -1651 ml     Physical Exam  Awake Alert, Oriented X 3, No new F.N deficits, Normal affect [place].AT,PERRAL Supple Neck,No JVD, No cervical lymphadenopathy appriciated.  Symmetrical Chest wall movement, Good air movement bilaterally, few rales RRR,No Gallops,Rubs or new Murmurs, No Parasternal Heave +ve B.Sounds, Abd Soft, No tenderness, No organomegaly appriciated, No rebound - guarding or rigidity. No Cyanosis, Clubbing , tarce edema, No new Rash or bruise      Data Review   Micro Results No results found for this or any previous visit (from the past 240 hour(s)).  Radiology Reports Dg Chest 2 View  11/22/2014   CLINICAL DATA:  Shortness of breath 2 days with feet swelling.  EXAM: CHEST  2  VIEW  COMPARISON:  09/22/2011  FINDINGS: Lungs are adequately inflated without consolidation or effusion. There is minimal prominence of the perihilar markings likely mild vascular congestion. There is moderate cardiomegaly which is worse. Remainder of the exam is unchanged.  IMPRESSION: Worsening of moderate cardiomegaly.  Mild vascular congestion.   Electronically Signed   By: Marin Olp M.D.   On: 11/22/2014 14:48       CBC  Recent Labs Lab 11/22/14 1420  WBC 5.0  HGB 11.8*  HCT 37.4  PLT 219  MCV 77.6*  MCH 24.5*  MCHC 31.6  RDW 17.9*    Chemistries   Recent Labs Lab 11/22/14 1420 11/23/14 0240  NA 139 139  K 3.8 3.7  CL 107 105  CO2 27 29  GLUCOSE 125* 164*  BUN 25* 24*  CREATININE 1.46* 1.36*  CALCIUM 9.4 8.8   ------------------------------------------------------------------------------------------------------------------ estimated creatinine clearance is 43.4 mL/min (by C-G formula based on Cr of 1.36). ------------------------------------------------------------------------------------------------------------------ No results for input(s): HGBA1C in the last 72 hours. ------------------------------------------------------------------------------------------------------------------ No results for input(s): CHOL, HDL, LDLCALC, TRIG, CHOLHDL, LDLDIRECT in the last 72 hours. ------------------------------------------------------------------------------------------------------------------  Recent Labs  11/23/14 0240  TSH 1.922   ------------------------------------------------------------------------------------------------------------------ No results for input(s): VITAMINB12, FOLATE, FERRITIN, TIBC, IRON, RETICCTPCT in the last 72 hours.  Coagulation profile No results for input(s): INR, PROTIME in the last 168 hours.  No results for input(s): DDIMER in the last 72 hours.  Cardiac Enzymes  Recent Labs Lab 11/22/14 2230 11/23/14 0240  TROPONINI  0.04* 0.04*   ------------------------------------------------------------------------------------------------------------------ Invalid input(s): POCBNP     Time Spent in minutes  35   SINGH,PRASHANT K M.D on 11/23/2014 at 11:13 AM  Between 7am to 7pm - Pager - [phone]  After 7pm go to [email or web address] - [facility] Office  [phone]

## 2014-11-23 NOTE — Progress Notes (Signed)
2D Echocardiogram Complete.  11/23/2014   Deliah Boston, RDCS   Preliminary Technician Findings:  There was a small to moderate pericardial effusion, which was also seen on a prior echo from January 20, 2013.

## 2014-11-24 ENCOUNTER — Other Ambulatory Visit: Payer: Self-pay | Admitting: Internal Medicine

## 2014-11-24 ENCOUNTER — Inpatient Hospital Stay (HOSPITAL_COMMUNITY): Payer: BC Managed Care – PPO

## 2014-11-24 DIAGNOSIS — I5043 Acute on chronic combined systolic (congestive) and diastolic (congestive) heart failure: Secondary | ICD-10-CM

## 2014-11-24 DIAGNOSIS — I429 Cardiomyopathy, unspecified: Secondary | ICD-10-CM

## 2014-11-24 DIAGNOSIS — N289 Disorder of kidney and ureter, unspecified: Secondary | ICD-10-CM

## 2014-11-24 DIAGNOSIS — I5041 Acute combined systolic (congestive) and diastolic (congestive) heart failure: Secondary | ICD-10-CM

## 2014-11-24 LAB — GLUCOSE, CAPILLARY
Glucose-Capillary: 123 mg/dL — ABNORMAL HIGH (ref 70–99)
Glucose-Capillary: 132 mg/dL — ABNORMAL HIGH (ref 70–99)
Glucose-Capillary: 129 mg/dL — ABNORMAL HIGH (ref 70–99)
Glucose-Capillary: 107 mg/dL — ABNORMAL HIGH (ref 70–99)

## 2014-11-24 LAB — TROPONIN I
Troponin I: 0.03 ng/mL (ref <0.031)
Troponin I: 0.03 ng/mL (ref <0.031)

## 2014-11-24 MED ORDER — GADOBENATE DIMEGLUMINE 529 MG/ML IV SOLN
30.0000 mL | Freq: Once | INTRAVENOUS | Status: AC | PRN
  Administered 2014-11-24: 30 mL via INTRAVENOUS

## 2014-11-24 MED ORDER — FUROSEMIDE 10 MG/ML IJ SOLN
40.0000 mg | Freq: Every day | INTRAMUSCULAR | Status: DC
  Filled 2014-11-24: qty 4

## 2014-11-24 MED ORDER — HYDRALAZINE HCL 20 MG/ML IJ SOLN: 10.0000 mg | Freq: Four times a day (QID) | INTRAMUSCULAR | Status: DC | PRN

## 2014-11-24 MED ORDER — FUROSEMIDE 80 MG PO TABS: 80.0000 mg | ORAL_TABLET | Freq: Three times a day (TID) | ORAL | Status: DC

## 2014-11-24 MED ORDER — LOSARTAN POTASSIUM 50 MG PO TABS
50.0000 mg | ORAL_TABLET | Freq: Two times a day (BID) | ORAL | Status: DC
  Administered 2014-11-25 – 2014-11-26 (×3): 50 mg via ORAL
  Filled 2014-11-24 (×4): qty 1

## 2014-11-24 NOTE — Progress Notes (Addendum)
Patient Demographics  Jody Nguyen, is a 58 y.o. female, DOB - 11/24/1956, [id number]  Admit date - 11/22/2014   Admitting Physician Toy Baker, MD  Outpatient Primary MD for the patient is Alesia Richards, MD  LOS - 2   Chief Complaint  Patient presents with  . Shortness of Breath        Subjective:   Jody Nguyen today has, No headache, No chest pain, No abdominal pain - No Nausea, No new weakness tingling or numbness, No Cough - improved SOB.    Assessment & Plan    1. Acute respiratory failure due to acute on chronic diastolic and systolic CHF with EF [id number] now. She had missed a few Lasix doses at home, much improved after IV Lasix which will be continued at a lower dose, continue oxygen and nebulizer treatments as needed, salt and fluid restriction, daily weight, intake output monitoring, continue beta blocker and ARB. Increase activity try to titrate off oxygen. Cardiology following, cardiac MRI ordered by cardiology to evaluate the cause of cardiomyopathy.   2. Essential hypertension. On high doses of Catapres, beta blocker, minoxidil, Lasix. Blood pressure soft this morning so holding hydralazine. Holding parameters for other blood pressure medications written.   3. DM type II. On 7 70-30 twice a day along with sliding scale. Monitor CBGs.  Lab Results  Component Value Date   HGBA1C 6.7* 11/22/2014    CBG (last 3)   Recent Labs  11/23/14 1658 11/23/14 2122 11/24/14 0645  GLUCAP 110* 148* 123*     4. CK D stage IV. Baseline creatinine 1.4. Stable.     Code Status: Full  Family Communication: husband  Disposition Plan: home   Procedures   Cardiac MRI  TTE  - Left ventricle: The cavity size was normal. Wall thickness wasincreased in a  pattern of severe LVH. Systolic function wasmildly reduced. The estimated ejection fraction was in the range of 45% to 50%. Diffuse hypokinesis. - Right ventricle: Systolic function was moderately reduced. - Right atrium: The atrium was mildly dilated. - Pulmonary arteries: Systolic pressure was mildly increased. - Pericardium, extracardiac: A moderate, free-flowing pericardial effusion was identified circumferential to the heart. There wasno evidence of hemodynamic compromise.   Consults   Cards   Medications  Scheduled Meds: . aspirin  81 mg Oral Daily  . cloNIDine  0.2 mg Transdermal Q7 days  . enoxaparin (LOVENOX) injection  40 mg Subcutaneous Q24H  . [START ON 11/25/2014] furosemide  40 mg Intravenous Daily  . insulin aspart  0-15 Units Subcutaneous TID WC  . insulin aspart  0-5 Units Subcutaneous QHS  . insulin aspart protamine- aspart  10 Units Subcutaneous BID WC  . labetalol  200 mg Oral BID  . losartan  100 mg Oral BID  . minoxidil  20 mg Oral q morning - 10a   And  . minoxidil  10 mg Oral QHS  . potassium chloride SA  20 mEq Oral Daily  . rosuvastatin  20 mg Oral Daily  . sodium chloride  3 mL Intravenous Q12H   Continuous Infusions:  PRN Meds:.sodium chloride, acetaminophen, ondansetron (ZOFRAN) IV, sodium chloride  DVT Prophylaxis  Lovenox   Lab Results  Component Value Date   PLT 219 11/22/2014  Antibiotics    Anti-infectives    None          Objective:   Filed Vitals:   11/24/14 0224 11/24/14 0524 11/24/14 0825 11/24/14 0829  BP: 154/84 134/66 95/49 113/52  Pulse: 88 99 98 97  Temp: 98.2 F (36.8 C) 99.2 F (37.3 C)    TempSrc: Oral     Resp: 20 20    Height:      Weight:  85.821 kg (189 lb 3.2 oz)    SpO2: 100% 98% 97%     Wt Readings from Last 3 Encounters:  11/24/14 85.821 kg (189 lb 3.2 oz)  11/15/14 85.73 kg (189 lb)  10/29/14 85.276 kg (188 lb)     Intake/Output Summary (Last 24 hours) at 11/24/14 1113 Last data filed  at 11/24/14 [id number]  Gross per 24 hour  Intake    713 ml  Output   3825 ml  Net  -3112 ml     Physical Exam  Awake Alert, Oriented X 3, No new F.N deficits, Normal affect [place].AT,PERRAL Supple Neck,No JVD, No cervical lymphadenopathy appriciated.  Symmetrical Chest wall movement, Good air movement bilaterally, few rales RRR,No Gallops,Rubs or new Murmurs, No Parasternal Heave +ve B.Sounds, Abd Soft, No tenderness, No organomegaly appriciated, No rebound - guarding or rigidity. No Cyanosis, Clubbing , tarce edema, No new Rash or bruise      Data Review   Micro Results No results found for this or any previous visit (from the past 240 hour(s)).  Radiology Reports Dg Chest 2 View  11/22/2014   CLINICAL DATA:  Shortness of breath 2 days with feet swelling.  EXAM: CHEST  2 VIEW  COMPARISON:  09/22/2011  FINDINGS: Lungs are adequately inflated without consolidation or effusion. There is minimal prominence of the perihilar markings likely mild vascular congestion. There is moderate cardiomegaly which is worse. Remainder of the exam is unchanged.  IMPRESSION: Worsening of moderate cardiomegaly.  Mild vascular congestion.   Electronically Signed   By: Marin Olp M.D.   On: 11/22/2014 14:48       CBC  Recent Labs Lab 11/22/14 1420  WBC 5.0  HGB 11.8*  HCT 37.4  PLT 219  MCV 77.6*  MCH 24.5*  MCHC 31.6  RDW 17.9*    Chemistries   Recent Labs Lab 11/22/14 1420 11/23/14 0240  NA 139 139  K 3.8 3.7  CL 107 105  CO2 27 29  GLUCOSE 125* 164*  BUN 25* 24*  CREATININE 1.46* 1.36*  CALCIUM 9.4 8.8   ------------------------------------------------------------------------------------------------------------------ estimated creatinine clearance is 42.9 mL/min (by C-G formula based on Cr of 1.36). ------------------------------------------------------------------------------------------------------------------  Recent Labs  11/22/14 2230  HGBA1C 6.7*   CBG (last 3)    Recent Labs  11/23/14 1658 11/23/14 2122 11/24/14 0645  GLUCAP 110* 148* 123*     ------------------------------------------------------------------------------------------------------------------ No results for input(s): CHOL, HDL, LDLCALC, TRIG, CHOLHDL, LDLDIRECT in the last 72 hours. ------------------------------------------------------------------------------------------------------------------  Recent Labs  11/23/14 0240  TSH 1.922   ------------------------------------------------------------------------------------------------------------------ No results for input(s): VITAMINB12, FOLATE, FERRITIN, TIBC, IRON, RETICCTPCT in the last 72 hours.  Coagulation profile No results for input(s): INR, PROTIME in the last 168 hours.  No results for input(s): DDIMER in the last 72 hours.  Cardiac Enzymes  Recent Labs Lab 11/22/14 2230 11/23/14 0240 11/24/14 0924  TROPONINI 0.04* 0.04* 0.03   ------------------------------------------------------------------------------------------------------------------ Invalid input(s): POCBNP     Time Spent in minutes  35   SINGH,PRASHANT K M.D on 11/24/2014 at 11:13 AM  Between 7am to 7pm - Pager - [phone]  After 7pm go to [email or web address] - [facility] Office  [phone]

## 2014-11-24 NOTE — Consult Note (Addendum)
CARDIOLOGY CONSULT NOTE  Patient ID: Jody Nguyen, MRN: [id number], DOB/AGE: 59-10-1956 59 y.o. Admit date: 11/22/2014 Date of Consult: 11/24/2014  Primary Physician: Alesia Richards, MD Primary Cardiologist: Dr Stanford Breed Referring Physician: Dr Candiss Norse  Chief Complaint: Shortness of Breath  Reason for Consultation: Heart failure, elevated troponin, decline in LVEF  HPI: 58 year old woman with long-standing history of malignant hypertension and diastolic heart failure who was hospitalized yesterday with shortness of breath. Clinically she was felt to have congestive heart failure and an echocardiogram was done. This demonstrated severe left ventricular hypertrophy, moderate pericardial effusion, reduced LVEF of 45-50%, and a speckled pattern of LV hypertrophy was noted. The patient was also noted to have very minimal troponin elevation of 0.04 x 2. Chest x-ray showed worsening cardiomegaly and pulmonary vascular congestion. The patient has been diuresed with IV Lasix and we are asked to see her for further evaluation.  The patient reports progressive shortness of breath over about 48 hours leading up to her hospitalization. She did not have orthopnea or PND. However, she did notice significant swelling in her feet. The patient had run out of her furosemide and had not taken this for approximately 72 hours prior to hospital admission. She's had no chest pain or pressure. She did have some pain in her back and left arm this morning, but thinks it may have been related to the way she was sleeping in the hospital bed. She has no pain at the present time. She denies lightheadedness, syncope, diaphoresis, nausea, or vomiting. She adheres to a low-sodium diet. She's had no cough, fever, or chills.  Medical History:  Past Medical History  Diagnosis Date  . Obesity   . Elevated cholesterol   . Vitamin D deficiency   . DM (diabetes mellitus)   . Hypertension   . CVA (cerebral infarction) 1997      no residual deficit  . Anemia   . Renal insufficiency   . Vitamin D deficiency   . Diverticulitis   . Stroke 1997      Surgical History:  Past Surgical History  Procedure Laterality Date  . Cesarean section      x 2     Home Meds: Prior to Admission medications   Medication Sig Start Date End Date Taking? Authorizing Provider  aspirin 81 MG tablet Take 81 mg by mouth daily.     Yes Historical Provider, MD  canagliflozin (INVOKANA) 100 MG TABS tablet Take 1 tablet (100 mg total) by mouth daily. 09/25/14  Yes Vicie Mutters, PA-C  Cholecalciferol (VITAMIN D-3) 5000 UNITS TABS Take 1 tablet by mouth daily.   Yes Historical Provider, MD  cloNIDine (CATAPRES - DOSED IN MG/24 HR) 0.2 mg/24hr patch Place 1 patch (0.2 mg total) onto the skin every 7 (seven) days. 07/17/14  Yes Vicie Mutters, PA-C  cyclobenzaprine (FLEXERIL) 10 MG tablet Take 1 tablet (10 mg total) by mouth 2 (two) times daily as needed for muscle spasms. 11/15/14  Yes Britt Bottom, NP  furosemide (LASIX) 80 MG tablet Take 1 tablet (80 mg total) by mouth 3 (three) times daily. Patient taking differently: Take 80 mg by mouth daily.  03/03/14  Yes Unk Pinto, MD  glucose blood test strip Freestyle lite test strips, check sugar 3 times daily. DX 250.41 08/04/14  Yes Vicie Mutters, PA-C  Insulin Aspart Prot & Aspart (NOVOLOG MIX 70/30 FLEXPEN) (70-30) 100 UNIT/ML Pen Inject 20 Units into the skin 2 (two) times daily. 08/04/14  Yes Vicie Mutters, PA-C  labetalol (  NORMODYNE) 200 MG tablet Take 1 tablet (200 mg total) by mouth 2 (two) times daily. 02/12/13  Yes Lelon Perla, MD  Lancets MISC Use as directed 02/20/13  Yes Historical Provider, MD  losartan (COZAAR) 100 MG tablet Take 100 mg by mouth 2 (two) times daily.   Yes Historical Provider, MD  Magnesium 250 MG TABS Take 1 tablet by mouth daily.   Yes Historical Provider, MD  metFORMIN (GLUCOPHAGE) 1000 MG tablet Take 1 tablet (1,000 mg total) by mouth 2 (two) times  daily. Patient taking differently: Take 1,000 mg by mouth daily with breakfast.  12/22/13  Yes Melissa R Smith, PA-C  minoxidil (LONITEN) 10 MG tablet Take 1 tablet (10 mg total) by mouth 3 (three) times daily. Patient taking differently: Take 10 mg by mouth 3 (three) times daily. Patient takes 20 mg in A and 10 mg PM 04/03/14  Yes Unk Pinto, MD  naproxen (NAPROSYN) 500 MG tablet Take 1 tablet (500 mg total) by mouth 2 (two) times daily. 11/15/14  Yes Britt Bottom, NP  potassium chloride SA (K-DUR,KLOR-CON) 20 MEQ tablet Take 1 tablet (20 mEq total) by mouth daily. 07/17/14  Yes Vicie Mutters, PA-C  rosuvastatin (CRESTOR) 20 MG tablet Take 1 tablet (20 mg total) by mouth daily. 11/17/14  Yes Unk Pinto, MD    Inpatient Medications:  . aspirin  81 mg Oral Daily  . cloNIDine  0.2 mg Transdermal Q7 days  . enoxaparin (LOVENOX) injection  40 mg Subcutaneous Q24H  . [START ON 11/25/2014] furosemide  40 mg Intravenous Daily  . insulin aspart  0-15 Units Subcutaneous TID WC  . insulin aspart  0-5 Units Subcutaneous QHS  . insulin aspart protamine- aspart  10 Units Subcutaneous BID WC  . labetalol  200 mg Oral BID  . losartan  100 mg Oral BID  . minoxidil  20 mg Oral q morning - 10a   And  . minoxidil  10 mg Oral QHS  . potassium chloride SA  20 mEq Oral Daily  . rosuvastatin  20 mg Oral Daily  . sodium chloride  3 mL Intravenous Q12H      Allergies:  Allergies  Allergen Reactions  . Ace Inhibitors   . Lipitor [Atorvastatin]     Tolerates rosuvastatin    History   Social History  . Marital Status: Married    Spouse Name: N/A    Number of Children: 3  . Years of Education: N/A   Occupational History  .      Unemployed; used to work as a Radio broadcast assistant   Social History Main Topics  . Smoking status: Never Smoker   . Smokeless tobacco: Never Used  . Alcohol Use: No  . Drug Use: No  . Sexual Activity: Yes    Birth Control/ Protection: None   Other  Topics Concern  . Not on file   Social History Narrative     Family History  Problem Relation Age of Onset  . Heart attack Brother     MI in his 68s  . Cancer Sister     breast cancer  . Breast cancer Sister   . Stroke Mother      Review of Systems: General: negative for chills, fever, night sweats or weight changes.  ENT: negative for rhinorrhea or epistaxis Cardiovascular: See history of present illness Dermatological: negative for rash Respiratory: negative for cough or wheezing GI: negative for nausea, vomiting, diarrhea, bright red blood per rectum, melena, or hematemesis GU:  no hematuria, urgency, or frequency Neurologic: negative for visual changes, syncope, headache, or dizziness Heme: no easy bruising or bleeding Endo: negative for excessive thirst, thyroid disorder, or flushing Musculoskeletal: negative for joint pain or swelling, negative for myalgias  All other systems reviewed and are otherwise negative except as noted above.  Physical Exam: Blood pressure 113/52, pulse 97, temperature 99.2 F (37.3 C), temperature source Oral, resp. rate 20, height 4' 11.5" (1.511 m), weight 189 lb 3.2 oz (85.821 kg), SpO2 97 %. Pt is alert and oriented, WD, WN, in no distress. HEENT: normal Neck: JVP normal. Carotid upstrokes normal without bruits. No thyromegaly. Lungs: equal expansion, diminished in the bases but otherwise clear with good air movement bilaterally CV: Apex is discrete and nondisplaced, RRR grade 2/6 systolic ejection murmur at the left lower sternal border. I do not appreciate an S3 or S4 gallop Abd: soft, NT, +BS, no bruit, no hepatosplenomegaly Back: no CVA tenderness Ext: Trace pedal edema bilaterally        DP/PT pulses intact and = Skin: warm and dry without rash Neuro: CNII-XII intact             Strength intact = bilaterally    Labs:  Recent Labs  11/22/14 2230 11/23/14 0240  TROPONINI 0.04* 0.04*   Lab Results  Component Value Date    WBC 5.0 11/22/2014   HGB 11.8* 11/22/2014   HCT 37.4 11/22/2014   MCV 77.6* 11/22/2014   PLT 219 11/22/2014    Recent Labs Lab 11/23/14 0240  NA 139  K 3.7  CL 105  CO2 29  BUN 24*  CREATININE 1.36*  CALCIUM 8.8  GLUCOSE 164*   Lab Results  Component Value Date   CHOL 155 10/29/2014   HDL 57 10/29/2014   LDLCALC 81 10/29/2014   TRIG 84 10/29/2014   No results found for: DDIMER  Radiology/Studies:  Dg Chest 2 View  11/22/2014   CLINICAL DATA:  Shortness of breath 2 days with feet swelling.  EXAM: CHEST  2 VIEW  COMPARISON:  09/22/2011  FINDINGS: Lungs are adequately inflated without consolidation or effusion. There is minimal prominence of the perihilar markings likely mild vascular congestion. There is moderate cardiomegaly which is worse. Remainder of the exam is unchanged.  IMPRESSION: Worsening of moderate cardiomegaly.  Mild vascular congestion.   Electronically Signed   By: Marin Olp M.D.   On: 11/22/2014 14:48   Dg Lumbar Spine Complete  11/15/2014   CLINICAL DATA:  Low back pain radiating to right hip region  EXAM: LUMBAR SPINE - COMPLETE 4+ VIEW  COMPARISON:  None.  FINDINGS: Frontal, lateral, spot lumbosacral lateral, and bilateral oblique views were obtained. There are 5 non-rib-bearing lumbar type vertebral bodies. There is no fracture or spondylolisthesis. There is slight disc space narrowing at L5-S1. Other disc spaces appear unremarkable. There are small anterior osteophytes at L3 and L4. There is facet osteoarthritic change at L5-S1 bilaterally.  IMPRESSION: Mild osteoarthritic change.  No fracture or spondylolisthesis.   Electronically Signed   By: Lowella Grip M.D.   On: 11/15/2014 11:05   Dg Hip Complete Right  11/15/2014   CLINICAL DATA:  Right hip region pain.  No history trauma.  EXAM: RIGHT HIP - COMPLETE 2+ VIEW  COMPARISON:  None.  FINDINGS: Frontal pelvis as well as frontal and lateral right hip images were obtained. There is no fracture or  dislocation. There is slight symmetric narrowing of both hip joints. There is bony overgrowth along the  lateral acetabula on each side. No erosive change.  IMPRESSION: Mild symmetric narrowing of the hip joints. Bony overgrowth along each lateral acetabulum. This bony overgrowth may lead to so-called femoroacetabular syndrome. No fracture or dislocation.   Electronically Signed   By: Lowella Grip M.D.   On: 11/15/2014 11:04    EKG: NSR with LVH, repolarization abnormality, prolonged QT (QTc 500 ms), HR 96 bpm  Cardiac Studies: 2D Echo: Study Conclusions  - Left ventricle: The cavity size was normal. Wall thickness was increased in a pattern of severe LVH. Systolic function was mildly reduced. The estimated ejection fraction was in the range of 45% to 50%. Diffuse hypokinesis. - Right ventricle: Systolic function was moderately reduced. - Right atrium: The atrium was mildly dilated. - Pulmonary arteries: Systolic pressure was mildly increased. - Pericardium, extracardiac: A moderate, free-flowing pericardial effusion was identified circumferential to the heart. There was no evidence of hemodynamic compromise.  ASSESSMENT AND PLAN:  1. Acute on chronic mixed systolic and diastolic heart failure 2. New diagnosis of LV systolic dysfunction 3. Minimal troponin elevation, suspect secondary to #1 4. Malignant hypertension, uncontrolled 5. Type 2 diabetes with renal complications 6. Chronic kidney disease, stage III 7. Pericardial effusion  The patient has an acute exacerbation of congestive heart failure. I suspect this is related to diuretic noncompliance and uncontrolled hypertension. The constellation of findings on her echocardiogram with paracardial effusion, severe LVH, and a speckled pattern of hypertrophy raise concern for infiltrative cardiomyopathy. However, this can also be seen with severe hypertensive heart disease which I think is much more likely. The patient has  manifestations of longstanding severe HTN and diabetes with proteinuria, renal dysfunction, and severe LVH.  Reviewed heart failure education with patient and husband extensively - sodium restriction, med adherence, HTN and diabetes management, weight management.   To be complete, recommend the following:   Cardiac MR with and without contrast to evaluate for etiology of cardiomyopathy (ischemic, infiltrative/restrictive)  Continue IV lasix but caution to avoid overdiuresis  Agree with addition of hydralazine for better BP control  Our team will follow with you, thx  Signed, Sherren Mocha MD, [facility] 11/24/2014, 9:13 AM

## 2014-11-24 NOTE — Care Management Note (Addendum)
  Page 1 of 1   11/26/2014     11:57:23 AM CARE MANAGEMENT NOTE 11/26/2014  Patient:  Jody Nguyen, Jody Nguyen   Account Number:  [id number]  Date Initiated:  11/24/2014  Documentation initiated by:  Mariann Laster  Subjective/Objective Assessment:   SOB, New CHF     Action/Plan:   CM to follow for disposition needs   Anticipated DC Date:  11/25/2014   Anticipated DC Plan:  [place]  CM consult      PAC Choice  DURABLE MEDICAL EQUIPMENT   Choice offered to / List presented to:     DME arranged  [facility]      DME agency  [name].        Status of service:  Completed, signed off Medicare Important Message given?  NO (If response is "NO", the following Medicare IM given date fields will be blank) Date Medicare IM given:   Medicare IM given by:   Date Additional Medicare IM given:   Additional Medicare IM given by:    Discharge Disposition:  HOME/SELF CARE  Per UR Regulation:  Reviewed for med. necessity/level of care/duration of stay  If discussed at [place] of Stay Meetings, dates discussed:    Comments:  Jeneane Pieczynski RN, BSN, MSHL, CCM  Nurse - Case Manager,  ([facility] [place])  [phone]   11/26/2014 Home oxygen ([facility]/jermaine notified)

## 2014-11-25 DIAGNOSIS — I319 Disease of pericardium, unspecified: Secondary | ICD-10-CM

## 2014-11-25 DIAGNOSIS — I5033 Acute on chronic diastolic (congestive) heart failure: Secondary | ICD-10-CM

## 2014-11-25 DIAGNOSIS — N182 Chronic kidney disease, stage 2 (mild): Secondary | ICD-10-CM

## 2014-11-25 LAB — BASIC METABOLIC PANEL
Anion gap: 5 (ref 5–15)
BUN: 25 mg/dL — ABNORMAL HIGH (ref 6–23)
CO2: 29 mmol/L (ref 19–32)
Calcium: 8.7 mg/dL (ref 8.4–10.5)
Chloride: 103 mEq/L (ref 96–112)
Creatinine, Ser: 1.4 mg/dL — ABNORMAL HIGH (ref 0.50–1.10)
GFR calc Af Amer: 47 mL/min — ABNORMAL LOW (ref >90)
GFR calc non Af Amer: 40 mL/min — ABNORMAL LOW (ref >90)
Glucose, Bld: 109 mg/dL — ABNORMAL HIGH (ref 70–99)
Potassium: 3.6 mmol/L (ref 3.5–5.1)
Sodium: 137 mmol/L (ref 135–145)

## 2014-11-25 LAB — GLUCOSE, CAPILLARY
Glucose-Capillary: 123 mg/dL — ABNORMAL HIGH (ref 70–99)
Glucose-Capillary: 153 mg/dL — ABNORMAL HIGH (ref 70–99)
Glucose-Capillary: 100 mg/dL — ABNORMAL HIGH (ref 70–99)
Glucose-Capillary: 189 mg/dL — ABNORMAL HIGH (ref 70–99)

## 2014-11-25 LAB — TROPONIN I: Troponin I: 0.04 ng/mL — ABNORMAL HIGH (ref <0.031)

## 2014-11-25 MED ORDER — FUROSEMIDE 40 MG PO TABS
40.0000 mg | ORAL_TABLET | Freq: Two times a day (BID) | ORAL | Status: DC
  Administered 2014-11-25 – 2014-11-26 (×3): 40 mg via ORAL
  Filled 2014-11-25 (×5): qty 1

## 2014-11-25 MED ORDER — HYDRALAZINE HCL 50 MG PO TABS
50.0000 mg | ORAL_TABLET | Freq: Three times a day (TID) | ORAL | Status: DC
  Administered 2014-11-25 – 2014-11-26 (×3): 50 mg via ORAL
  Filled 2014-11-25 (×6): qty 1

## 2014-11-25 NOTE — Progress Notes (Signed)
Patient Demographics  Jody Nguyen, is a 58 y.o. female, DOB - [birth year], [id number]  Admit date - 11/22/2014   Admitting Physician Toy Baker, MD  Outpatient Primary MD for the patient is Alesia Richards, MD  LOS - 3   Chief Complaint  Patient presents with  . Shortness of Breath        Subjective:   [name] today has, No headache, No chest pain, No abdominal pain - No Nausea, No new weakness tingling or numbness, No Cough - improved SOB.    Assessment & Plan    1. Acute respiratory failure due to acute on chronic diastolic and systolic CHF with EF [id number] now. She had missed a few Lasix doses at home, much improved after IV Lasix which will be continued at a lower dose, continue oxygen and nebulizer treatments as needed, salt and fluid restriction, daily weight, intake output monitoring, continue beta blocker and ARB. Increase activity try to titrate off oxygen. Cardiology following, and MRI noted. Discussed with cardiologist Dr. Sallyanne Kuster.    2. Essential hypertension. On high doses of Catapres, beta blocker, ARB and now hydralazine, Minoxadil stopped due to pericardial effusion by cardiology, continue Lasix. 1 at her blood pressures closely.   3. DM type II. On 7 70-30 twice a day along with sliding scale. Monitor CBGs.  Lab Results  Component Value Date   HGBA1C 6.7* 11/22/2014    CBG (last 3)   Recent Labs  11/24/14 2051 11/25/14 0631 11/25/14 1104  GLUCAP 107* 123* 153*      4. CK D stage IV. Baseline creatinine 1.4. Stable.    5. Pericardial effusion noted on cardiac MRI. Likely due to minoxidil per Cards. Stopped by cardiology. Outpatient follow-up with cardiology post discharge.     Code Status: Full  Family Communication:  husband  Disposition Plan: home   Procedures   Cardiac MRI  TTE  - Left ventricle: The cavity size was normal. Wall thickness wasincreased in a pattern of severe LVH. Systolic function wasmildly reduced. The estimated ejection fraction was in the range of 45% to 50%. Diffuse hypokinesis. - Right ventricle: Systolic function was moderately reduced. - Right atrium: The atrium was mildly dilated. - Pulmonary arteries: Systolic pressure was mildly increased. - Pericardium, extracardiac: A moderate, free-flowing pericardial effusion was identified circumferential to the heart. There wasno evidence of hemodynamic compromise.   Consults   Cards   Medications  Scheduled Meds: . aspirin  81 mg Oral Daily  . cloNIDine  0.2 mg Transdermal Q7 days  . enoxaparin (LOVENOX) injection  40 mg Subcutaneous Q24H  . furosemide  40 mg Oral BID  . hydrALAZINE  50 mg Oral 3 times per day  . insulin aspart  0-15 Units Subcutaneous TID WC  . insulin aspart  0-5 Units Subcutaneous QHS  . insulin aspart protamine- aspart  10 Units Subcutaneous BID WC  . labetalol  200 mg Oral BID  . losartan  50 mg Oral BID  . potassium chloride SA  20 mEq Oral Daily  . sodium chloride  3 mL Intravenous Q12H   Continuous Infusions:  PRN Meds:.sodium chloride, acetaminophen, hydrALAZINE, ondansetron (ZOFRAN) IV, sodium chloride  DVT Prophylaxis  Lovenox   Lab Results  Component Value Date  PLT 219 11/22/2014    Antibiotics    Anti-infectives    None          Objective:   Filed Vitals:   11/24/14 1146 11/24/14 2015 11/25/14 0618 11/25/14 0950  BP: 120/59 157/69 132/72 137/67  Pulse: 92 107 92   Temp:  98.1 F (36.7 C) 99.1 F (37.3 C)   TempSrc:  Oral Oral   Resp:  18 16   Height:      Weight:   85.639 kg (188 lb 12.8 oz)   SpO2:  92% 96%     Wt Readings from Last 3 Encounters:  11/25/14 85.639 kg (188 lb 12.8 oz)  11/15/14 85.73 kg (189 lb)  10/29/14 85.276 kg (188 lb)      Intake/Output Summary (Last 24 hours) at 11/25/14 1131 Last data filed at 11/25/14 0931  Gross per 24 hour  Intake   1220 ml  Output   2100 ml  Net   -880 ml     Physical Exam  Awake Alert, Oriented X 3, No new F.N deficits, Normal affect [place].AT,PERRAL Supple Neck,No JVD, No cervical lymphadenopathy appriciated.  Symmetrical Chest wall movement, Good air movement bilaterally, few rales RRR,No Gallops,Rubs or new Murmurs, No Parasternal Heave +ve B.Sounds, Abd Soft, No tenderness, No organomegaly appriciated, No rebound - guarding or rigidity. No Cyanosis, Clubbing , tarce edema, No new Rash or bruise      Data Review   Micro Results No results found for this or any previous visit (from the past 240 hour(s)).  Radiology Reports Dg Chest 2 View  11/22/2014   CLINICAL DATA:  Shortness of breath 2 days with feet swelling.  EXAM: CHEST  2 VIEW  COMPARISON:  09/22/2011  FINDINGS: Lungs are adequately inflated without consolidation or effusion. There is minimal prominence of the perihilar markings likely mild vascular congestion. There is moderate cardiomegaly which is worse. Remainder of the exam is unchanged.  IMPRESSION: Worsening of moderate cardiomegaly.  Mild vascular congestion.   Electronically Signed   By: Marin Olp M.D.   On: 11/22/2014 14:48       CBC  Recent Labs Lab 11/22/14 1420  WBC 5.0  HGB 11.8*  HCT 37.4  PLT 219  MCV 77.6*  MCH 24.5*  MCHC 31.6  RDW 17.9*    Chemistries   Recent Labs Lab 11/22/14 1420 11/23/14 0240 11/25/14 0009  NA 139 139 137  K 3.8 3.7 3.6  CL 107 105 103  CO2 27 29 29   GLUCOSE 125* 164* 109*  BUN 25* 24* 25*  CREATININE 1.46* 1.36* 1.40*  CALCIUM 9.4 8.8 8.7   ------------------------------------------------------------------------------------------------------------------ estimated creatinine clearance is 41.6 mL/min (by C-G formula based on Cr of  1.4). ------------------------------------------------------------------------------------------------------------------  Recent Labs  11/22/14 2230  HGBA1C 6.7*   CBG (last 3)   Recent Labs  11/24/14 2051 11/25/14 0631 11/25/14 1104  GLUCAP 107* 123* 153*     ------------------------------------------------------------------------------------------------------------------ No results for input(s): CHOL, HDL, LDLCALC, TRIG, CHOLHDL, LDLDIRECT in the last 72 hours. ------------------------------------------------------------------------------------------------------------------  Recent Labs  11/23/14 0240  TSH 1.922   ------------------------------------------------------------------------------------------------------------------ No results for input(s): VITAMINB12, FOLATE, FERRITIN, TIBC, IRON, RETICCTPCT in the last 72 hours.  Coagulation profile No results for input(s): INR, PROTIME in the last 168 hours.  No results for input(s): DDIMER in the last 72 hours.  Cardiac Enzymes  Recent Labs Lab 11/24/14 0924 11/24/14 1614 11/25/14 0009  TROPONINI 0.03 0.03 0.04*   ------------------------------------------------------------------------------------------------------------------ Invalid input(s): POCBNP  Time Spent in minutes  35   SINGH,PRASHANT K M.D on 11/25/2014 at 11:31 AM  Between 7am to 7pm - Pager - [phone]  After 7pm go to [email or web address] - [facility] Office  [phone]

## 2014-11-25 NOTE — Progress Notes (Signed)
UR completed  - retro  Initial   Lamoyne Hessel K. Siler Mavis, RN, BSN, [place], CCM  11/25/2014 5:41 PM

## 2014-11-25 NOTE — Progress Notes (Signed)
Patient Name: Jody Nguyen Date of Encounter: 11/25/2014  Primary Cardiologist: Dr. Stanford Breed   Active Problems:   Hypertension   Diastolic CHF, acute on chronic   CKD (chronic kidney disease) stage 2, GFR 60-89 ml/min   Diabetes mellitus   CHF (congestive heart failure)   CHF (congestive heart failure), NYHA class I    SUBJECTIVE  No more SOB, slept ok last night. Denies any CP  CURRENT MEDS . aspirin  81 mg Oral Daily  . cloNIDine  0.2 mg Transdermal Q7 days  . enoxaparin (LOVENOX) injection  40 mg Subcutaneous Q24H  . furosemide  40 mg Intravenous Daily  . insulin aspart  0-15 Units Subcutaneous TID WC  . insulin aspart  0-5 Units Subcutaneous QHS  . insulin aspart protamine- aspart  10 Units Subcutaneous BID WC  . labetalol  200 mg Oral BID  . losartan  50 mg Oral BID  . minoxidil  20 mg Oral q morning - 10a   And  . minoxidil  10 mg Oral QHS  . potassium chloride SA  20 mEq Oral Daily  . sodium chloride  3 mL Intravenous Q12H    OBJECTIVE  Filed Vitals:   11/24/14 0829 11/24/14 1146 11/24/14 2015 11/25/14 0618  BP: 113/52 120/59 157/69 132/72  Pulse: 97 92 107 92  Temp:   98.1 F (36.7 C) 99.1 F (37.3 C)  TempSrc:   Oral Oral  Resp:   18 16  Height:      Weight:    188 lb 12.8 oz (85.639 kg)  SpO2:   92% 96%    Intake/Output Summary (Last 24 hours) at 11/25/14 0909 Last data filed at 11/25/14 [id number]  Gross per 24 hour  Intake   1000 ml  Output   2350 ml  Net  -1350 ml   Filed Weights   11/23/14 0533 11/24/14 0524 11/25/14 0618  Weight: 193 lb 4.6 oz (87.674 kg) 189 lb 3.2 oz (85.821 kg) 188 lb 12.8 oz (85.639 kg)    PHYSICAL EXAM  General: Pleasant, NAD. Neuro: Alert and oriented X 3. Moves all extremities spontaneously. Psych: Normal affect. HEENT:  Normal  Neck: Supple without bruits or JVD. Lungs:  Resp regular and unlabored, CTA. Heart: RRR no s3, s4, or murmurs. Abdomen: Soft, non-tender, non-distended, BS + x 4.  Extremities: No  clubbing, cyanosis or edema. DP/PT/Radials 2+ and equal bilaterally.  Accessory Clinical Findings  CBC  Recent Labs  11/22/14 1420  WBC 5.0  HGB 11.8*  HCT 37.4  MCV 77.6*  PLT [id number]   Basic Metabolic Panel  Recent Labs  11/23/14 0240 11/25/14 0009  NA 139 137  K 3.7 3.6  CL 105 103  CO2 29 29  GLUCOSE 164* 109*  BUN 24* 25*  CREATININE 1.36* 1.40*  CALCIUM 8.8 8.7   Cardiac Enzymes  Recent Labs  11/24/14 0924 11/24/14 1614 11/25/14 0009  TROPONINI 0.03 0.03 0.04*   Hemoglobin A1C  Recent Labs  11/22/14 2230  HGBA1C 6.7*   Thyroid Function Tests  Recent Labs  11/23/14 0240  TSH 1.922    TELE NSR with HR 90s    ECG  NSR with 1st degree AV block, LVH  Echocardiogram 11/23/2014  - Left ventricle: The cavity size was normal. Wall thickness was increased in a pattern of severe LVH. Systolic function was mildly reduced. The estimated ejection fraction was in the range of 45% to 50%. Diffuse hypokinesis. - Right ventricle: Systolic function was moderately reduced. -  Right atrium: The atrium was mildly dilated. - Pulmonary arteries: Systolic pressure was mildly increased. - Pericardium, extracardiac: A moderate, free-flowing pericardial effusion was identified circumferential to the heart. There was no evidence of hemodynamic compromise.     Radiology/Studies  Dg Chest 2 View  11/22/2014   CLINICAL DATA:  Shortness of breath 2 days with feet swelling.  EXAM: CHEST  2 VIEW  COMPARISON:  09/22/2011  FINDINGS: Lungs are adequately inflated without consolidation or effusion. There is minimal prominence of the perihilar markings likely mild vascular congestion. There is moderate cardiomegaly which is worse. Remainder of the exam is unchanged.  IMPRESSION: Worsening of moderate cardiomegaly.  Mild vascular congestion.   Electronically Signed   By: Marin Olp M.D.   On: 11/22/2014 14:48   Dg Lumbar Spine Complete  11/15/2014   CLINICAL  DATA:  Low back pain radiating to right hip region  EXAM: LUMBAR SPINE - COMPLETE 4+ VIEW  COMPARISON:  None.  FINDINGS: Frontal, lateral, spot lumbosacral lateral, and bilateral oblique views were obtained. There are 5 non-rib-bearing lumbar type vertebral bodies. There is no fracture or spondylolisthesis. There is slight disc space narrowing at L5-S1. Other disc spaces appear unremarkable. There are small anterior osteophytes at L3 and L4. There is facet osteoarthritic change at L5-S1 bilaterally.  IMPRESSION: Mild osteoarthritic change.  No fracture or spondylolisthesis.   Electronically Signed   By: Lowella Grip M.D.   On: 11/15/2014 11:05   Dg Hip Complete Right  11/15/2014   CLINICAL DATA:  Right hip region pain.  No history trauma.  EXAM: RIGHT HIP - COMPLETE 2+ VIEW  COMPARISON:  None.  FINDINGS: Frontal pelvis as well as frontal and lateral right hip images were obtained. There is no fracture or dislocation. There is slight symmetric narrowing of both hip joints. There is bony overgrowth along the lateral acetabula on each side. No erosive change.  IMPRESSION: Mild symmetric narrowing of the hip joints. Bony overgrowth along each lateral acetabulum. This bony overgrowth may lead to so-called femoroacetabular syndrome. No fracture or dislocation.   Electronically Signed   By: Lowella Grip M.D.   On: 11/15/2014 11:04   Mr Card Morphology Wo/w Cm  11/25/2014   CLINICAL DATA:  Cardiomyopathy, assess for cardiac amyloidosis  EXAM: CARDIAC MRI  TECHNIQUE: The patient was scanned on a 1.5 Tesla GE magnet. A dedicated cardiac coil was used. Functional imaging was done using Fiesta sequences. 2,3, and 4 chamber views were done to assess for RWMA's. Modified Simpson's rule using a short axis stack was used to calculate an ejection fraction on a dedicated work Conservation officer, nature. The patient received 32 cc of Multihance. After 10 minutes inversion recovery sequences were used to assess for  infiltration and scar tissue.  CONTRAST:  32 cc  FINDINGS: Limited images of the lung fields showed no gross abnormalities.  There was a large, circumferential pericardial effusion. There was indention of the right atrium. There was no RV diastolic notching or collapse. The IVC was dilated. Overall, the effusion did not appear hemodynamically significant (I do not think tamponade is present).  The left ventricle was normal in size with moderate LV hypertrophy. Hypertrophy was concentric. Normal overall systolic function with EF 61% and normal wall motion. The left atrium was mildly dilated. The right ventricle was mildly dilated with mild systolic dysfunction. No RV hypertrophy. The right atrium was mildly dilated. There was a borderline atrial septal aneurysm. The aortic valve was trileaflet without significant regurgitation  or stenosis. There did not appear to be significant mitral stenosis.  On delayed enhancement imaging, there was subtle/patchy delayed late gadolinium enhancement in the subendocardial to mid wall of the basal inferolateral and basal anterolateral segments. There was also a small area of mid-wall mid inferoseptal LGE at the RV insertion site.  MEASUREMENTS: MEASUREMENTS LV EDV 203  LV SV 125  LV EF 61%  IMPRESSION: 1. Large, free-flowing pericardial effusion without evidence for tamponade.  2. Moderate concentric LVH without evidence for asymmetry or SAM to suggest hypertrophic cardiomyopathy. EF 61%, normal.  3.  Mildly dilated RV with mildly decreased systolic function.  4. There was a subtle, patchy delayed enhancement pattern in the walls noted above that did not appear to be in a coronary disease pattern. It was not characteristic of amyloidosis, which tends to be more extensive and we did not have trouble nulling the myocardium as you often see with amyloidosis. Patchy delayed enhancement can be seen with severe hypertensive heart disease.  Dalton Mclean   Electronically Signed   By:  Loralie Champagne M.D.   On: 11/25/2014 07:29    ASSESSMENT AND PLAN  1. Acute on chronic combined systolic and diastolic heart failure  - 2/2 uncontrolled hypertension and running out of diuretic  - Echo 11/23/2014 EF 45-50%, severe LVH, diffuse hypokinesis, moderately reduced RVEF, moderate pericardial effusion  - per Dr. Burt Knack, echo finding consistent with severe uncontrolled HTN (which is more likely) vs infiltrative dx, cardiac MRI shows large free flowing pericardial effusion, EF 61%, finding not characteristic of amyloidosis, often seen in severe hypertensive heart disease  - Net -5L, weight down from 194 --> 188, near euvolemic on exam  - previously taking 80mg  daily of PO lasix (per pt, not TID as documented in EPIC), however she diuresed 2.6L on 40mg  PO lasix. Will do 40mg  BID PO lasix for now, check BMET in 1 week, if Cr up, will scale back to 40mg  daily.    - likely can be discharged today or tomorrow.   2. Newly diagnosed LV dysfunction: see #1  3. Minimal trop elevation, secondary to #1  4. Malignant HTN: better controlled on clonidine, labetalol, losartan, minoxidil 5. DM2 6. CKD, stage III 7. Moderate pericardial effusion  - will need repeat outpatient echo in 1-2 weeks   Signed, Almyra Deforest PA-C Pager: [phone]  I have seen and examined the patient along with Almyra Deforest PA-C.  I have reviewed the chart, notes and new data.  I agree with PA's note.  Key new complaints: feels better, less dyspnea Key examination changes: O2 sat recently decreased to 85% Key new findings / data: moderate (markedly larger) circumferential pericardial effusion on echo and LVEF decreased to 40-45% by echo (afterload related?), back up to 61% on cine MRI  PLAN: Effusion is probably minoxidil related. This medication needs to be stopped. Repeat echo in 1-2 months. Will try to substitute hydralazine. If compliance with meds improves, BP control will probably not be as hard.  Sanda Klein,  MD, [place] [phone] 11/25/2014, 12:07 PM

## 2014-11-25 NOTE — Progress Notes (Signed)
Pt had been on 2l /nd this am. O2 removed and sat 85% at rest on room air

## 2014-11-26 ENCOUNTER — Other Ambulatory Visit: Payer: Self-pay | Admitting: Physician Assistant

## 2014-11-26 DIAGNOSIS — I313 Pericardial effusion (noninflammatory): Secondary | ICD-10-CM

## 2014-11-26 DIAGNOSIS — I3139 Other pericardial effusion (noninflammatory): Secondary | ICD-10-CM

## 2014-11-26 LAB — GLUCOSE, CAPILLARY
Glucose-Capillary: 134 mg/dL — ABNORMAL HIGH (ref 70–99)
Glucose-Capillary: 106 mg/dL — ABNORMAL HIGH (ref 70–99)

## 2014-11-26 MED ORDER — LOSARTAN POTASSIUM 50 MG PO TABS: 50.0000 mg | ORAL_TABLET | Freq: Two times a day (BID) | ORAL | Status: DC

## 2014-11-26 MED ORDER — FUROSEMIDE 40 MG PO TABS: 40.0000 mg | ORAL_TABLET | Freq: Two times a day (BID) | ORAL | Status: DC

## 2014-11-26 MED ORDER — HYDRALAZINE HCL 50 MG PO TABS: 50.0000 mg | ORAL_TABLET | Freq: Three times a day (TID) | ORAL | Status: DC

## 2014-11-26 NOTE — Progress Notes (Signed)
SATURATION QUALIFICATIONS: (This note is used to comply with regulatory documentation for home oxygen) ° °Patient Saturations on Room Air at Rest = 90% ° °Patient Saturations on Room Air while Ambulating = 87% ° °Patient Saturations on 2 Liters of oxygen while Ambulating = 95% ° °Please briefly explain why patient needs home oxygen:  °

## 2014-11-26 NOTE — Discharge Summary (Signed)
Jody Nguyen, is a 58 y.o. female  DOB Jun 09, 1956  MRN [id number].  Admission date:  11/22/2014  Admitting Physician  Toy Baker, MD  Discharge Date:  11/26/2014   Primary MD  Alesia Richards, MD  Recommendations for primary care physician for things to follow:   Monitor weight, BMP and diuretic dose closely. Monitor blood pressure closely.  Close follow-up with cardiology and a repeat echogram for pericardial effusion.   Admission Diagnosis  Chest pain [R07.9] Congestive heart failure, unspecified congestive heart failure chronicity, unspecified congestive heart failure type [I50.9]   Discharge Diagnosis  Chest pain [R07.9] Congestive heart failure, unspecified congestive heart failure chronicity, unspecified congestive heart failure type [I50.9]    Active Problems:   Hypertension   Diastolic CHF, acute on chronic   CKD (chronic kidney disease) stage 2, GFR 60-89 ml/min   Diabetes mellitus   CHF (congestive heart failure)   CHF (congestive heart failure), NYHA class I   Pericardial effusion      Past Medical History  Diagnosis Date  . Obesity   . Elevated cholesterol   . Vitamin D deficiency   . DM (diabetes mellitus)   . Hypertension   . CVA (cerebral infarction) 1997    no residual deficit  . Anemia   . Renal insufficiency   . Vitamin D deficiency   . Diverticulitis   . Stroke 1997    Past Surgical History  Procedure Laterality Date  . Cesarean section      x 2       History of present illness and  Hospital Course:     Kindly see H&P for history of present illness and admission details, please review complete Labs, Consult reports and Test reports for all details in brief  HPI  from the history and physical done on the day of admission  Patient has been short of breath for the  past 1 week. She states this started after she had a cortizone shot for her hip pain. Patient has run out of her lasix prescription and have not taken any for the past 3 days. She have been eating some food rich in Sodium lately. She endorsees leg edema. She needs 3 pillows to sleep but that has been unchanged for past few years. She denies any chest pain. patient was not aware of diagnosis of CHF. Last echo 2014 showing EF [id number] and diastolic dysfunction with evidence of LVH. On arrival to ER she was found to have o2 sat 90% on RA. On 2L she is sating 97%. CXR showing cardiomegaly and vascular congestion. She was given 80mg  IV of lasix with good diuresis but still has some shortness of breath. He Lasix dose has been decreased down to 80 QD from TID 1 year ago she is not sure why.   Hospitalist was called for admission for [facility] Course    1. Acute respiratory failure due to acute on chronic diastolic and systolic CHF with EF [id number] now. She had missed a few Lasix doses at  home, much improved after IV Lasix , stable on 40mg  PO BID Lasix, may need home oxygen, symtom free, educated on compliance along with  salt and fluid restriction, daily weight, intake output monitoring, continue beta blocker and ARB.  Clear for discharge by cardiology, Discussed with cardiologist Dr. Sallyanne Kuster.    2. Essential hypertension. On high doses of Catapres, beta blocker, along with hydralazine and low-dose ARB, minoxidil has been stopped due to pericardial effusion, her blood pressure has been stable on much lower doses of blood pressure medications and she was using at home, also her diuretic dose has gone down from previously raising suspicion of compliance with salt and fluid restriction. Patient educated.    3. DM type II. A1c 6.7. Resume home regimen.    4. CK D stage IV. Baseline creatinine 1.4. Stable.    5. Pericardial effusion noted on cardiac MRI. Likely due to minoxidil per Cards. Stopped by  cardiology. Outpatient follow-up with cardiology post discharge. She will need repeat echo sewn.      Discharge Condition: Stable   Follow UP  Follow-up Information    Follow up with [facility].   Why:  Portable Oxygen tank to room prior to discharge and oxygen set up to home following discharge home.   Contact information:   [address] [place] [address] [phone]       Follow up with [name], MD On 12/28/2014.   Specialty:  Cardiology   Why:  11:15 AM   Contact information:   [place] [address] [phone]       Follow up with Echocardiogram.   Why:  The office will call with the procedure date and time   Contact information:   [place] [address] [phone]      Follow up with Unk Pinto DAVID, MD. Schedule an appointment as soon as possible for a visit in 3 days.   Specialty:  Internal Medicine   Contact information:   [address] [place] [address] [phone]       Follow up with CROITORU,MIHAI, MD. Schedule an appointment as soon as possible for a visit in 2 weeks.   Specialty:  Cardiology   Contact information:   [address] [place] [address] [phone]         Discharge Instructions  and  Discharge Medications      Discharge Instructions    Discharge instructions    Complete by:  As directed   Follow with Primary MD MCKEOWN,WILLIAM DAVID, MD in 3 days   Get CBC, CMP, 2 view Chest X ray checked  by Primary MD next visit.    Activity: As tolerated with Full fall precautions use walker/cane & assistance as needed   Disposition Home     Diet: Heart Healthy Low Carb ,   Check your Weight same time everyday, if you gain over 2 pounds, or you develop in leg swelling, experience more shortness of breath or chest pain, call your Primary MD immediately. Follow Cardiac Low Salt Diet and 1.5 lit/day  fluid restriction.   On your next visit with your primary care physician please Get Medicines reviewed and adjusted.   Please request your Prim.MD to go over all Hospital Tests and Procedure/Radiological results at the follow up, please get all Hospital records sent to your Prim MD by signing hospital release before you go home.   If you experience  worsening of your admission symptoms, develop shortness of breath, life threatening emergency, suicidal or homicidal thoughts you must seek medical attention immediately by calling 911 or calling your MD immediately  if symptoms less severe.  You Must read complete instructions/literature along with all the possible adverse reactions/side effects for all the Medicines you take and that have been prescribed to you. Take any new Medicines after you have completely understood and accpet all the possible adverse reactions/side effects.   Do not drive, operating heavy machinery, perform activities at heights, swimming or participation in water activities or provide baby sitting services if your were admitted for syncope or siezures until you have seen by Primary MD or a Neurologist and advised to do so again.  Do not drive when taking Pain medications.    Do not take more than prescribed Pain, Sleep and Anxiety Medications  Special Instructions: If you have smoked or chewed Tobacco  in the last 2 yrs please stop smoking, stop any regular Alcohol  and or any Recreational drug use.  Wear Seat belts while driving.   Please note  You were cared for by a hospitalist during your hospital stay. If you have any questions about your discharge medications or the care you received while you were in the hospital after you are discharged, you can call the unit and asked to speak with the hospitalist on call if the hospitalist that took care of you is not available. Once you are discharged, your primary care physician will handle any further medical issues. Please  note that NO REFILLS for any discharge medications will be authorized once you are discharged, as it is imperative that you return to your primary care physician (or establish a relationship with a primary care physician if you do not have one) for your aftercare needs so that they can reassess your need for medications and monitor your lab values.     Increase activity slowly    Complete by:  As directed             Medication List    STOP taking these medications        minoxidil 10 MG tablet  Commonly known as:  LONITEN     naproxen 500 MG tablet  Commonly known as:  NAPROSYN      TAKE these medications        aspirin 81 MG tablet  Take 81 mg by mouth daily.     canagliflozin 100 MG Tabs tablet  Commonly known as:  INVOKANA  Take 1 tablet (100 mg total) by mouth daily.     cloNIDine 0.2 mg/24hr patch  Commonly known as:  CATAPRES - Dosed in mg/24 hr  Place 1 patch (0.2 mg total) onto the skin every 7 (seven) days.     cyclobenzaprine 10 MG tablet  Commonly known as:  FLEXERIL  Take 1 tablet (10 mg total) by mouth 2 (two) times daily as needed for muscle spasms.     furosemide 40 MG tablet  Commonly known as:  LASIX  Take 1 tablet (40 mg total) by mouth 2 (two) times daily.     glucose blood test strip  Freestyle lite test strips, check sugar 3 times daily. DX 250.41     hydrALAZINE 50 MG tablet  Commonly known as:  APRESOLINE  Take 1 tablet (50 mg total) by mouth every 8 (eight) hours.     Insulin Aspart Prot & Aspart (70-30) 100 UNIT/ML Pen  Commonly known as:  NOVOLOG MIX 70/30 FLEXPEN  Inject 20 Units into the skin 2 (two) times daily.     labetalol 200 MG tablet  Commonly known as:  NORMODYNE  Take 1 tablet (200 mg total) by mouth 2 (two) times daily.     Lancets Misc  Use as directed     losartan 50 MG tablet  Commonly known as:  COZAAR  Take 1 tablet (50 mg total) by mouth 2 (two) times daily.     Magnesium 250 MG Tabs  Take 1 tablet by mouth  daily.     metFORMIN 1000 MG tablet  Commonly known as:  GLUCOPHAGE  Take 1 tablet (1,000 mg total) by mouth 2 (two) times daily.     potassium chloride SA 20 MEQ tablet  Commonly known as:  K-DUR,KLOR-CON  Take 1 tablet (20 mEq total) by mouth daily.     rosuvastatin 20 MG tablet  Commonly known as:  CRESTOR  Take 1 tablet (20 mg total) by mouth daily.     Vitamin D-3 5000 UNITS Tabs  Take 1 tablet by mouth daily.          Diet and Activity recommendation: See Discharge Instructions above   Consults obtained -  Cards   Major procedures and Radiology Reports - PLEASE review detailed and final reports for all details, in brief -    TTE  - Left ventricle: The cavity size was normal. Wall thickness wasincreased in a pattern of severe LVH. Systolic function wasmildly reduced. The estimated ejection fraction was in the range of 45% to 50%. Diffuse hypokinesis. - Right ventricle: Systolic function was moderately reduced. - Right atrium: The atrium was mildly dilated. - Pulmonary arteries: Systolic pressure was mildly increased. - Pericardium, extracardiac: A moderate, free-flowing pericardial effusion was identified circumferential to the heart. There wasno evidence of hemodynamic compromise.   Dg Chest 2 View  11/22/2014   CLINICAL DATA:  Shortness of breath 2 days with feet swelling.  EXAM: CHEST  2 VIEW  COMPARISON:  09/22/2011  FINDINGS: Lungs are adequately inflated without consolidation or effusion. There is minimal prominence of the perihilar markings likely mild vascular congestion. There is moderate cardiomegaly which is worse. Remainder of the exam is unchanged.  IMPRESSION: Worsening of moderate cardiomegaly.  Mild vascular congestion.   Electronically Signed   By: Marin Olp M.D.   On: 11/22/2014 14:48   Dg Lumbar Spine Complete  11/15/2014   CLINICAL DATA:  Low back pain radiating to right hip region  EXAM: LUMBAR SPINE - COMPLETE 4+ VIEW  COMPARISON:  None.   FINDINGS: Frontal, lateral, spot lumbosacral lateral, and bilateral oblique views were obtained. There are 5 non-rib-bearing lumbar type vertebral bodies. There is no fracture or spondylolisthesis. There is slight disc space narrowing at L5-S1. Other disc spaces appear unremarkable. There are small anterior osteophytes at L3 and L4. There is facet osteoarthritic change at L5-S1 bilaterally.  IMPRESSION: Mild osteoarthritic change.  No fracture or spondylolisthesis.   Electronically Signed   By: Lowella Grip M.D.   On: 11/15/2014 11:05   Dg Hip Complete Right  11/15/2014   CLINICAL DATA:  Right hip region pain.  No history trauma.  EXAM: RIGHT HIP - COMPLETE 2+ VIEW  COMPARISON:  None.  FINDINGS: Frontal pelvis as well as frontal and lateral right hip images were obtained. There is no fracture or dislocation. There is slight symmetric narrowing of both hip joints. There is bony overgrowth along the lateral acetabula on each side. No  erosive change.  IMPRESSION: Mild symmetric narrowing of the hip joints. Bony overgrowth along each lateral acetabulum. This bony overgrowth may lead to so-called femoroacetabular syndrome. No fracture or dislocation.   Electronically Signed   By: Lowella Grip M.D.   On: 11/15/2014 11:04   Mr Card Morphology Wo/w Cm  11/25/2014   CLINICAL DATA:  Cardiomyopathy, assess for cardiac amyloidosis  EXAM: CARDIAC MRI  TECHNIQUE: The patient was scanned on a 1.5 Tesla GE magnet. A dedicated cardiac coil was used. Functional imaging was done using Fiesta sequences. 2,3, and 4 chamber views were done to assess for RWMA's. Modified Simpson's rule using a short axis stack was used to calculate an ejection fraction on a dedicated work Conservation officer, nature. The patient received 32 cc of Multihance. After 10 minutes inversion recovery sequences were used to assess for infiltration and scar tissue.  CONTRAST:  32 cc  FINDINGS: Limited images of the lung fields showed no gross  abnormalities.  There was a large, circumferential pericardial effusion. There was indention of the right atrium. There was no RV diastolic notching or collapse. The IVC was dilated. Overall, the effusion did not appear hemodynamically significant (I do not think tamponade is present).  The left ventricle was normal in size with moderate LV hypertrophy. Hypertrophy was concentric. Normal overall systolic function with EF 61% and normal wall motion. The left atrium was mildly dilated. The right ventricle was mildly dilated with mild systolic dysfunction. No RV hypertrophy. The right atrium was mildly dilated. There was a borderline atrial septal aneurysm. The aortic valve was trileaflet without significant regurgitation or stenosis. There did not appear to be significant mitral stenosis.  On delayed enhancement imaging, there was subtle/patchy delayed late gadolinium enhancement in the subendocardial to mid wall of the basal inferolateral and basal anterolateral segments. There was also a small area of mid-wall mid inferoseptal LGE at the RV insertion site.  MEASUREMENTS: MEASUREMENTS LV EDV 203  LV SV 125  LV EF 61%  IMPRESSION: 1. Large, free-flowing pericardial effusion without evidence for tamponade.  2. Moderate concentric LVH without evidence for asymmetry or SAM to suggest hypertrophic cardiomyopathy. EF 61%, normal.  3.  Mildly dilated RV with mildly decreased systolic function.  4. There was a subtle, patchy delayed enhancement pattern in the walls noted above that did not appear to be in a coronary disease pattern. It was not characteristic of amyloidosis, which tends to be more extensive and we did not have trouble nulling the myocardium as you often see with amyloidosis. Patchy delayed enhancement can be seen with severe hypertensive heart disease.  Dalton Mclean   Electronically Signed   By: Loralie Champagne M.D.   On: 11/25/2014 07:29    Micro Results      No results found for this or any previous  visit (from the past 240 hour(s)).     Today   Subjective:   Kailie Kuipers today has no headache,no chest abdominal pain,no new weakness tingling or numbness, feels much better wants to go home today.    Objective:   Blood pressure 118/60, pulse 39, temperature 98.1 F (36.7 C), temperature source Oral, resp. rate 22, height 4' 11.5" (1.511 m), weight 86.1 kg (189 lb 13.1 oz), SpO2 98 %.   Intake/Output Summary (Last 24 hours) at 11/26/14 1116 Last data filed at 11/26/14 0900  Gross per 24 hour  Intake   1080 ml  Output   2100 ml  Net  -1020 ml  Exam Awake Alert, Oriented x 3, No new F.N deficits, Normal affect [place].AT,PERRAL Supple Neck,No JVD, No cervical lymphadenopathy appriciated.  Symmetrical Chest wall movement, Good air movement bilaterally, CTAB RRR,No Gallops,Rubs or new Murmurs, No Parasternal Heave +ve B.Sounds, Abd Soft, Non tender, No organomegaly appriciated, No rebound -guarding or rigidity. No Cyanosis, Clubbing or edema, No new Rash or bruise  Data Review   CBC w Diff: Lab Results  Component Value Date   WBC 5.0 11/22/2014   WBC 5.3 03/03/2013   HGB 11.8* 11/22/2014   HGB 12.1 03/03/2013   HCT 37.4 11/22/2014   HCT 37.4 03/03/2013   PLT 219 11/22/2014   PLT 288 03/03/2013   LYMPHOPCT 35 10/29/2014   LYMPHOPCT 33.7 03/03/2013   MONOPCT 7 10/29/2014   MONOPCT 6.8 03/03/2013   EOSPCT 5 10/29/2014   EOSPCT 5.0 03/03/2013   BASOPCT 1 10/29/2014   BASOPCT 0.7 03/03/2013    CMP: Lab Results  Component Value Date   NA 137 11/25/2014   NA 140 03/03/2013   K 3.6 11/25/2014   K 3.8 03/03/2013   CL 103 11/25/2014   CL 99 03/03/2013   CO2 29 11/25/2014   CO2 29 03/03/2013   BUN 25* 11/25/2014   BUN 28.1* 03/03/2013   CREATININE 1.40* 11/25/2014   CREATININE 1.44* 10/29/2014   CREATININE 1.9* 03/03/2013   CREATININE 1.15* 03/06/2012   PROT 7.4 10/29/2014   PROT 8.0 03/03/2013   ALBUMIN 4.0 10/29/2014   ALBUMIN 3.7 03/03/2013   BILITOT  0.6 10/29/2014   BILITOT 0.55 03/03/2013   ALKPHOS 49 10/29/2014   ALKPHOS 67 03/03/2013   AST 12 10/29/2014   AST 15 03/03/2013   ALT 9 10/29/2014   ALT 17 03/03/2013  .   Total Time in preparing paper work, data evaluation and todays exam - 35 minutes  Thurnell Lose M.D on 11/26/2014 at 11:16 AM  [facility] Office  [phone]

## 2014-11-26 NOTE — Discharge Instructions (Signed)
Follow with Primary MD MCKEOWN,WILLIAM DAVID, MD in 3 days   Get CBC, CMP, 2 view Chest X ray checked  by Primary MD next visit.    Activity: As tolerated with Full fall precautions use walker/cane & assistance as needed   Disposition Home     Diet: Heart Healthy Low Carb ,   Check your Weight same time everyday, if you gain over 2 pounds, or you develop in leg swelling, experience more shortness of breath or chest pain, call your Primary MD immediately. Follow Cardiac Low Salt Diet and 1.5 lit/day fluid restriction.   On your next visit with your primary care physician please Get Medicines reviewed and adjusted.   Please request your Prim.MD to go over all Hospital Tests and Procedure/Radiological results at the follow up, please get all Hospital records sent to your Prim MD by signing hospital release before you go home.   If you experience worsening of your admission symptoms, develop shortness of breath, life threatening emergency, suicidal or homicidal thoughts you must seek medical attention immediately by calling 911 or calling your MD immediately  if symptoms less severe.  You Must read complete instructions/literature along with all the possible adverse reactions/side effects for all the Medicines you take and that have been prescribed to you. Take any new Medicines after you have completely understood and accpet all the possible adverse reactions/side effects.   Do not drive, operating heavy machinery, perform activities at heights, swimming or participation in water activities or provide baby sitting services if your were admitted for syncope or siezures until you have seen by Primary MD or a Neurologist and advised to do so again.  Do not drive when taking Pain medications.    Do not take more than prescribed Pain, Sleep and Anxiety Medications  Special Instructions: If you have smoked or chewed Tobacco  in the last 2 yrs please stop smoking, stop any regular Alcohol  and  or any Recreational drug use.  Wear Seat belts while driving.   Please note  You were cared for by a hospitalist during your hospital stay. If you have any questions about your discharge medications or the care you received while you were in the hospital after you are discharged, you can call the unit and asked to speak with the hospitalist on call if the hospitalist that took care of you is not available. Once you are discharged, your primary care physician will handle any further medical issues. Please note that NO REFILLS for any discharge medications will be authorized once you are discharged, as it is imperative that you return to your primary care physician (or establish a relationship with a primary care physician if you do not have one) for your aftercare needs so that they can reassess your need for medications and monitor your lab values.

## 2014-11-26 NOTE — Progress Notes (Signed)
Patient Name: Jody Nguyen Date of Encounter: 11/26/2014  Active Problems:   Hypertension   Diastolic CHF, acute on chronic   CKD (chronic kidney disease) stage 2, GFR 60-89 ml/min   Diabetes mellitus   CHF (congestive heart failure)   CHF (congestive heart failure), NYHA class I   Pericardial effusion   Length of Stay: 4  SUBJECTIVE  Remarkable improvement in BP, off minoxidil x 24 hours  CURRENT MEDS . aspirin  81 mg Oral Daily  . cloNIDine  0.2 mg Transdermal Q7 days  . enoxaparin (LOVENOX) injection  40 mg Subcutaneous Q24H  . furosemide  40 mg Oral BID  . hydrALAZINE  50 mg Oral 3 times per day  . insulin aspart  0-15 Units Subcutaneous TID WC  . insulin aspart  0-5 Units Subcutaneous QHS  . insulin aspart protamine- aspart  10 Units Subcutaneous BID WC  . labetalol  200 mg Oral BID  . losartan  50 mg Oral BID  . potassium chloride SA  20 mEq Oral Daily  . sodium chloride  3 mL Intravenous Q12H    OBJECTIVE   Intake/Output Summary (Last 24 hours) at 11/26/14 1027 Last data filed at 11/26/14 0900  Gross per 24 hour  Intake   1200 ml  Output   2100 ml  Net   -900 ml   Filed Weights   11/24/14 0524 11/25/14 0618 11/26/14 0532  Weight: 189 lb 3.2 oz (85.821 kg) 188 lb 12.8 oz (85.639 kg) 189 lb 13.1 oz (86.1 kg)    PHYSICAL EXAM Filed Vitals:   11/25/14 1443 11/25/14 1900 11/26/14 0532 11/26/14 0700  BP: 122/60 137/69 106/43 118/60  Pulse: 86 110 96 39  Temp: 98.7 F (37.1 C) 98.2 F (36.8 C) 98.1 F (36.7 C)   TempSrc: Oral Oral Oral   Resp: 18 16 22    Height:      Weight:   189 lb 13.1 oz (86.1 kg)   SpO2: 96% 95% 98%    General: Alert, oriented x3, no distress Head: no evidence of trauma, PERRL, EOMI, no exophtalmos or lid lag, no myxedema, no xanthelasma; normal ears, nose and oropharynx Neck: normal jugular venous pulsations and no hepatojugular reflux; brisk carotid pulses without delay and no carotid bruits Chest: clear to auscultation, no  signs of consolidation by percussion or palpation, normal fremitus, symmetrical and full respiratory excursions Cardiovascular: normal position and quality of the apical impulse, regular rhythm, normal first and second heart sounds, no rubs or gallops, no murmur Abdomen: no tenderness or distention, no masses by palpation, no abnormal pulsatility or arterial bruits, normal bowel sounds, no hepatosplenomegaly Extremities: no clubbing, cyanosis or edema; 2+ radial, ulnar and brachial pulses bilaterally; 2+ right femoral, posterior tibial and dorsalis pedis pulses; 2+ left femoral, posterior tibial and dorsalis pedis pulses; no subclavian or femoral bruits Neurological: grossly nonfocal  LABS  CBC No results for input(s): WBC, NEUTROABS, HGB, HCT, MCV, PLT in the last 72 hours. Basic Metabolic Panel  Recent Labs  11/25/14 0009  NA 137  K 3.6  CL 103  CO2 29  GLUCOSE 109*  BUN 25*  CREATININE 1.40*  CALCIUM 8.7   Liver Function Tests No results for input(s): AST, ALT, ALKPHOS, BILITOT, PROT, ALBUMIN in the last 72 hours. No results for input(s): LIPASE, AMYLASE in the last 72 hours. Cardiac Enzymes  Recent Labs  11/24/14 0924 11/24/14 1614 11/25/14 0009  TROPONINI 0.03 0.03 0.04*   BNP Invalid input(s): POCBNP D-Dimer No results for  input(s): DDIMER in the last 72 hours. Hemoglobin A1C No results for input(s): HGBA1C in the last 72 hours. Fasting Lipid Panel No results for input(s): CHOL, HDL, LDLCALC, TRIG, CHOLHDL, LDLDIRECT in the last 72 hours. Thyroid Function Tests No results for input(s): TSH, T4TOTAL, T3FREE, THYROIDAB in the last 72 hours.  Invalid input(s): [place]  Radiology Studies Imaging results have been reviewed and Mr Card Morphology Wo/w Cm  11/25/2014   CLINICAL DATA:  Cardiomyopathy, assess for cardiac amyloidosis  EXAM: CARDIAC MRI  TECHNIQUE: The patient was scanned on a 1.5 Tesla GE magnet. A dedicated cardiac coil was used. Functional imaging was  done using Fiesta sequences. 2,3, and 4 chamber views were done to assess for RWMA's. Modified Simpson's rule using a short axis stack was used to calculate an ejection fraction on a dedicated work Conservation officer, nature. The patient received 32 cc of Multihance. After 10 minutes inversion recovery sequences were used to assess for infiltration and scar tissue.  CONTRAST:  32 cc  FINDINGS: Limited images of the lung fields showed no gross abnormalities.  There was a large, circumferential pericardial effusion. There was indention of the right atrium. There was no RV diastolic notching or collapse. The IVC was dilated. Overall, the effusion did not appear hemodynamically significant (I do not think tamponade is present).  The left ventricle was normal in size with moderate LV hypertrophy. Hypertrophy was concentric. Normal overall systolic function with EF 61% and normal wall motion. The left atrium was mildly dilated. The right ventricle was mildly dilated with mild systolic dysfunction. No RV hypertrophy. The right atrium was mildly dilated. There was a borderline atrial septal aneurysm. The aortic valve was trileaflet without significant regurgitation or stenosis. There did not appear to be significant mitral stenosis.  On delayed enhancement imaging, there was subtle/patchy delayed late gadolinium enhancement in the subendocardial to mid wall of the basal inferolateral and basal anterolateral segments. There was also a small area of mid-wall mid inferoseptal LGE at the RV insertion site.  MEASUREMENTS: MEASUREMENTS LV EDV 203  LV SV 125  LV EF 61%  IMPRESSION: 1. Large, free-flowing pericardial effusion without evidence for tamponade.  2. Moderate concentric LVH without evidence for asymmetry or SAM to suggest hypertrophic cardiomyopathy. EF 61%, normal.  3.  Mildly dilated RV with mildly decreased systolic function.  4. There was a subtle, patchy delayed enhancement pattern in the walls noted above that  did not appear to be in a coronary disease pattern. It was not characteristic of amyloidosis, which tends to be more extensive and we did not have trouble nulling the myocardium as you often see with amyloidosis. Patchy delayed enhancement can be seen with severe hypertensive heart disease.  Dalton Mclean   Electronically Signed   By: Loralie Champagne M.D.   On: 11/25/2014 07:29    TELE NSR  ASSESSMENT AND PLAN Needs early follow up to reevaluate BP, likely to evolve a lot in next few days as minoxidil wears off and new meds reach steady state. Follow up echo and cardiology eval for the pericardial effusion in about a month.   Sanda Klein, MD, [facility] [phone] office [phone] pager 11/26/2014 10:27 AM

## 2014-11-26 NOTE — Progress Notes (Signed)
Pt discharged home. Discharge instructions completed with patient and husband. Verbalizes understanding.

## 2014-11-30 ENCOUNTER — Encounter: Payer: Self-pay | Admitting: Physician Assistant

## 2014-11-30 ENCOUNTER — Ambulatory Visit (HOSPITAL_COMMUNITY)
Admission: RE | Admit: 2014-11-30 | Discharge: 2014-11-30 | Disposition: A | Discharge status: Home / Self Care | Payer: BC Managed Care – PPO | Source: Ambulatory Visit | Attending: Physician Assistant | Admitting: Physician Assistant

## 2014-11-30 ENCOUNTER — Other Ambulatory Visit: Payer: Self-pay | Admitting: Physician Assistant

## 2014-11-30 ENCOUNTER — Ambulatory Visit (INDEPENDENT_AMBULATORY_CARE_PROVIDER_SITE_OTHER): Payer: BLUE CROSS/BLUE SHIELD | Admitting: Physician Assistant

## 2014-11-30 VITALS — BP 157/90 | HR 80 | Temp 97.7°F | Resp 16 | Wt 183.0 lb

## 2014-11-30 DIAGNOSIS — I3139 Other pericardial effusion (noninflammatory): Secondary | ICD-10-CM

## 2014-11-30 DIAGNOSIS — I313 Pericardial effusion (noninflammatory): Secondary | ICD-10-CM

## 2014-11-30 DIAGNOSIS — I10 Essential (primary) hypertension: Secondary | ICD-10-CM

## 2014-11-30 DIAGNOSIS — Z79899 Other long term (current) drug therapy: Secondary | ICD-10-CM

## 2014-11-30 DIAGNOSIS — I319 Disease of pericardium, unspecified: Secondary | ICD-10-CM

## 2014-11-30 DIAGNOSIS — J9811 Atelectasis: Secondary | ICD-10-CM | POA: Insufficient documentation

## 2014-11-30 DIAGNOSIS — I5040 Unspecified combined systolic (congestive) and diastolic (congestive) heart failure: Secondary | ICD-10-CM

## 2014-11-30 DIAGNOSIS — N182 Chronic kidney disease, stage 2 (mild): Secondary | ICD-10-CM

## 2014-11-30 DIAGNOSIS — J811 Chronic pulmonary edema: Secondary | ICD-10-CM | POA: Insufficient documentation

## 2014-11-30 DIAGNOSIS — R079 Chest pain, unspecified: Secondary | ICD-10-CM | POA: Insufficient documentation

## 2014-11-30 DIAGNOSIS — I517 Cardiomegaly: Secondary | ICD-10-CM | POA: Insufficient documentation

## 2014-11-30 LAB — HEPATIC FUNCTION PANEL
ALT: 20 U/L (ref 0–35)
AST: 20 U/L (ref 0–37)
Albumin: 4.1 g/dL (ref 3.5–5.2)
Alkaline Phosphatase: 44 U/L (ref 39–117)
Bilirubin, Direct: 0.2 mg/dL (ref 0.0–0.3)
Indirect Bilirubin: 0.5 mg/dL (ref 0.2–1.2)
Total Bilirubin: 0.7 mg/dL (ref 0.2–1.2)
Total Protein: 7.1 g/dL (ref 6.0–8.3)

## 2014-11-30 LAB — BASIC METABOLIC PANEL WITH GFR
BUN: 19 mg/dL (ref 6–23)
CO2: 33 mEq/L — ABNORMAL HIGH (ref 19–32)
Calcium: 9.9 mg/dL (ref 8.4–10.5)
Chloride: 98 mEq/L (ref 96–112)
Creat: 1.28 mg/dL — ABNORMAL HIGH (ref 0.50–1.10)
GFR, Est African American: 53 mL/min — ABNORMAL LOW
GFR, Est Non African American: 46 mL/min — ABNORMAL LOW
Glucose, Bld: 61 mg/dL — ABNORMAL LOW (ref 70–99)
Potassium: 3.8 mEq/L (ref 3.5–5.3)
Sodium: 138 mEq/L (ref 135–145)

## 2014-11-30 LAB — MAGNESIUM: Magnesium: 1.9 mg/dL (ref 1.5–2.5)

## 2014-11-30 LAB — CBC WITH DIFFERENTIAL/PLATELET
Basophils Absolute: 0 10*3/uL (ref 0.0–0.1)
Basophils Relative: 1 % (ref 0–1)
Eosinophils Absolute: 0.1 10*3/uL (ref 0.0–0.7)
Eosinophils Relative: 3 % (ref 0–5)
HCT: 37.9 % (ref 36.0–46.0)
Hemoglobin: 12 g/dL (ref 12.0–15.0)
Lymphocytes Relative: 34 % (ref 12–46)
Lymphs Abs: 1.4 10*3/uL (ref 0.7–4.0)
MCH: 24.5 pg — ABNORMAL LOW (ref 26.0–34.0)
MCHC: 31.7 g/dL (ref 30.0–36.0)
MCV: 77.3 fL — ABNORMAL LOW (ref 78.0–100.0)
MPV: 10.5 fL (ref 8.6–12.4)
Monocytes Absolute: 0.4 10*3/uL (ref 0.1–1.0)
Monocytes Relative: 11 % (ref 3–12)
Neutro Abs: 2 10*3/uL (ref 1.7–7.7)
Neutrophils Relative %: 51 % (ref 43–77)
Platelets: 278 10*3/uL (ref 150–400)
RBC: 4.9 MIL/uL (ref 3.87–5.11)
RDW: 17.8 % — ABNORMAL HIGH (ref 11.5–15.5)
WBC: 4 10*3/uL (ref 4.0–10.5)

## 2014-11-30 LAB — TSH: TSH: 2.788 u[IU]/mL (ref 0.350–4.500)

## 2014-11-30 MED ORDER — HYDRALAZINE HCL 100 MG PO TABS: 100.0000 mg | ORAL_TABLET | Freq: Three times a day (TID) | ORAL | Status: DC

## 2014-11-30 MED ORDER — POTASSIUM CHLORIDE CRYS ER 20 MEQ PO TBCR: 20.0000 meq | EXTENDED_RELEASE_TABLET | Freq: Every day | ORAL | Status: DC

## 2014-11-30 NOTE — Progress Notes (Signed)
Assessment and Plan: 1. Essential hypertension Will increase hydralazine to 100mg  TID due to CHf - CBC with Differential - Hepatic function panel - TSH  2. Combined systolic and diastolic congestive heart failure, unspecified congestive heart failure chronicity Follows up with cardio in 2 weeks, need better control of HTN, may need to increase labetolol to TID versus changing to coreg 25mg  BID, will increase hydralazine in the mean time. CHF- If any increasing shortness of breath, swelling, or chest pressure go to ER immediately. Decrease your sodium intake to less than 2000 mg daily, decrease your fluid intake to less than 2 L daily, elevate legs, weigh yourself daily, call the office if 5 lbs weight loss OR gain in a day, and please remember to always increase your potassium intake with any increase of your fluid pill.  - DG Chest 2 View; Future  3. CKD (chronic kidney disease) stage 2, GFR 60-89 ml/min Monitor closely - BASIC METABOLIC PANEL WITH GFR  4. Medication management - Magnesium  5. Pericardial effusion Repeat CXR, follow up with cardio.    HPI 59 y.o.female presents for follow up from the hospital. Admit date to the hospital was 11/22/14, patient was discharged from the hospital on 11/26/14, patient was admitted for: chest pain, CHF, and CKD stage II. She presents to the hospital with SOB after getting cortizone shot for hip and was off her lasix for 3 days. CXR showed cardiomegaly and vascular congestion, given 80mg  IV lasix and discharged on lasix 40mg  BID, and potassium once a day. She is suppose to follow up with Dr. Sallyanne Kuster. MRI in the hospital showed pericardial effusion, her minoxidil was stopped due to this. She was discharged with home health and set up with portable O2. She has a follow up with Dr Stanford Breed 12/28/2014. Lab Results  Component Value Date   CREATININE 1.40* 11/25/2014   BUN 25* 11/25/2014   NA 137 11/25/2014   K 3.6 11/25/2014   CL 103 11/25/2014    CO2 29 11/25/2014   Wt Readings from Last 3 Encounters:  11/30/14 183 lb (83.008 kg)  11/26/14 189 lb 13.1 oz (86.1 kg)  11/15/14 189 lb (85.73 kg)   She states her BP has been high at home, 160/88, today it is BP: (!) 157/90 mmHg  She is on clonidine 0,2 patch, placed Tuesday and will change it tomorrow, lasix 40mg  BID, Hydralazine 50mg  TID, Labetalol 200mg  BID,Losartan 50mg  BID. Sugars have been good, 110-120's. She states that since discharge she has had a sharp well localized left sided pain at her breast, better with standing/moving, worse with sitting, not worse with deep breath. Has had negative sleep study 2012, however patient states it was not a good study, did not sleep well. ? Benefit from at home sleep study/repeat.       Past Medical History  Diagnosis Date  . Obesity   . Elevated cholesterol   . Vitamin D deficiency   . DM (diabetes mellitus)   . Hypertension   . CVA (cerebral infarction) 1997    no residual deficit  . Anemia   . Renal insufficiency   . Vitamin D deficiency   . Diverticulitis   . Stroke 1997     Allergies  Allergen Reactions  . Ace Inhibitors   . Lipitor [Atorvastatin]     Tolerates rosuvastatin  . Minoxidil     Pericardial effusion      Current Outpatient Prescriptions on File Prior to Visit  Medication Sig Dispense Refill  .  aspirin 81 MG tablet Take 81 mg by mouth daily.      . canagliflozin (INVOKANA) 100 MG TABS tablet Take 1 tablet (100 mg total) by mouth daily. 90 tablet 1  . Cholecalciferol (VITAMIN D-3) 5000 UNITS TABS Take 1 tablet by mouth daily.    . cloNIDine (CATAPRES - DOSED IN MG/24 HR) 0.2 mg/24hr patch Place 1 patch (0.2 mg total) onto the skin every 7 (seven) days. 4 patch 2  . cyclobenzaprine (FLEXERIL) 10 MG tablet Take 1 tablet (10 mg total) by mouth 2 (two) times daily as needed for muscle spasms. 20 tablet 0  . furosemide (LASIX) 40 MG tablet Take 1 tablet (40 mg total) by mouth 2 (two) times daily. 60 tablet 0   . glucose blood test strip Freestyle lite test strips, check sugar 3 times daily. DX 250.41 100 each 12  . hydrALAZINE (APRESOLINE) 50 MG tablet Take 1 tablet (50 mg total) by mouth every 8 (eight) hours. 90 tablet 0  . Insulin Aspart Prot & Aspart (NOVOLOG MIX 70/30 FLEXPEN) (70-30) 100 UNIT/ML Pen Inject 20 Units into the skin 2 (two) times daily. 15 mL 11  . labetalol (NORMODYNE) 200 MG tablet Take 1 tablet (200 mg total) by mouth 2 (two) times daily. 180 tablet 4  . Lancets MISC Use as directed    . losartan (COZAAR) 50 MG tablet Take 1 tablet (50 mg total) by mouth 2 (two) times daily. 60 tablet 0  . Magnesium 250 MG TABS Take 1 tablet by mouth daily.    . metFORMIN (GLUCOPHAGE) 1000 MG tablet Take 1 tablet (1,000 mg total) by mouth 2 (two) times daily. (Patient taking differently: Take 1,000 mg by mouth daily with breakfast. ) 180 tablet 2  . potassium chloride SA (K-DUR,KLOR-CON) 20 MEQ tablet Take 1 tablet (20 mEq total) by mouth daily. 90 tablet 2  . rosuvastatin (CRESTOR) 20 MG tablet Take 1 tablet (20 mg total) by mouth daily. 90 tablet 1   No current facility-administered medications on file prior to visit.    ROS: all negative except above.   Physical Exam: Filed Weights   11/30/14 1045  Weight: 183 lb (83.008 kg)   BP 157/90 mmHg  Pulse 80  Temp(Src) 97.7 F (36.5 C)  Resp 16  Wt 183 lb (83.008 kg) General Appearance: Well nourished, in no apparent distress, has [place] in place with 2 L O2. Eyes: PERRLA, EOMs, conjunctiva no swelling or erythema Sinuses: No Frontal/maxillary tenderness ENT/Mouth: Ext aud canals clear, TMs without erythema, bulging. No erythema, swelling, or exudate on post pharynx.  Tonsils not swollen or erythematous.  Neck: Supple, thyroid normal.  Respiratory: Diffuse decreased breath sounds with mild crackles/wheezing lower bases.  Cardio: RRR with no MRGs. Brisk peripheral pulses with mild bilateral edema.  Abdomen: Soft, + BS  Non tender, no  guarding, rebound, hernias, masses. Lymphatics: Non tender without lymphadenopathy.  Musculoskeletal: Full ROM, 4/5 strength, normal gait.  Skin: Warm, dry without rashes, lesions, ecchymosis.  Neuro: Cranial nerves intact. Normal muscle tone, no cerebellar symptoms.  Psych: Awake and oriented X 3, normal affect, Insight and Judgment appropriate.    Vicie Mutters, PA-C 10:53 AM [facility]

## 2014-11-30 NOTE — Patient Instructions (Signed)
Hydralazine increase to 100mg  TID.  Continue the O2 for now until follow up with cardio, will reevaluate.  Do the following things EVERYDAY: 1) Weigh yourself in the morning before breakfast. Write it down and keep it in a log. 2) Take your medicines as prescribed 3) Eat low salt foods-Limit salt (sodium) to 2000 mg per day.  4) Stay as active as you can everyday 5) Limit all fluids for the day to less than 2 liters   Recommendations For Diabetic/Prediabetic Patients:   -  Take medications as prescribed  -  Recommend Dr Fara Olden Fuhrman's book "The End of Diabetes "  And "The End of Dieting"- Can get at  [email or web address] and encourage also get the Audio CD book  - AVOID Animal products, ie. Meat - red/white, Poultry and Dairy/especially cheese - Exercise at least 5 times a week for 30 minutes or preferably daily.  - No Smoking - Drink less than 2 drinks a day.  - Monitor your feet for sores - Have yearly Eye Exams - Recommend annual Flu vaccine  - Recommend Pneumovax and Prevnar vaccines - Shingles Vaccine (Zostavax) if over 55 y.o.  Goals:   - BMI less than 24 - Fasting sugar less than 130 or less than 150 if tapering medicines to lose weight  - Systolic BP less than [id number]  - Diastolic BP less than 60 - Bad LDL Cholesterol less than 70 - Triglycerides less than 150

## 2014-12-01 LAB — IRON AND TIBC
%SAT: 21 % (ref 20–55)
Iron: 59 ug/dL (ref 42–145)
TIBC: 276 ug/dL (ref 250–470)
UIBC: 217 ug/dL (ref 125–400)

## 2014-12-01 LAB — FERRITIN: Ferritin: 158 ng/mL (ref 10–291)

## 2014-12-07 ENCOUNTER — Telehealth: Payer: Self-pay

## 2014-12-07 NOTE — Telephone Encounter (Signed)
Received paper note from front office staff, patient called 8:51 a.m. Concerned with BP reading of 168/115 , per Vicie Mutters, PA , returned cal to patient and advised her to increase Labetalol from twice daily to three times daily, monitor and advise, follow up if needed here or Cardiology office, if any SOB, chest pain, patient aware to go to ER.

## 2014-12-17 ENCOUNTER — Other Ambulatory Visit: Payer: Self-pay

## 2014-12-17 DIAGNOSIS — N289 Disorder of kidney and ureter, unspecified: Secondary | ICD-10-CM

## 2014-12-17 MED ORDER — LABETALOL HCL 200 MG PO TABS: 200.0000 mg | ORAL_TABLET | Freq: Two times a day (BID) | ORAL | Status: DC

## 2014-12-17 NOTE — Telephone Encounter (Signed)
Patient called and advised that her Labetalol 200 mg was not received at [facility], resent today and also sent a 30 days supply to [facility], [address].

## 2014-12-21 ENCOUNTER — Ambulatory Visit (HOSPITAL_COMMUNITY)
Admission: RE | Admit: 2014-12-21 | Discharge: 2014-12-21 | Disposition: A | Discharge status: Home / Self Care | Payer: BLUE CROSS/BLUE SHIELD | Source: Ambulatory Visit | Attending: Cardiology | Admitting: Cardiology

## 2014-12-21 DIAGNOSIS — E119 Type 2 diabetes mellitus without complications: Secondary | ICD-10-CM | POA: Diagnosis not present

## 2014-12-21 DIAGNOSIS — I3139 Other pericardial effusion (noninflammatory): Secondary | ICD-10-CM

## 2014-12-21 DIAGNOSIS — I319 Disease of pericardium, unspecified: Secondary | ICD-10-CM | POA: Diagnosis present

## 2014-12-21 DIAGNOSIS — I10 Essential (primary) hypertension: Secondary | ICD-10-CM | POA: Diagnosis not present

## 2014-12-21 DIAGNOSIS — I313 Pericardial effusion (noninflammatory): Secondary | ICD-10-CM

## 2014-12-21 NOTE — Progress Notes (Signed)
2D Limited Echocardiogram Complete.  12/21/2014   Brithney Bensen [place], RDCS

## 2014-12-23 NOTE — Progress Notes (Signed)
HPI: FU hypertension. Myoview in Oct 2012 showed EF 49, no ischemia, LVE. Renal dopplers Nov 2013 showed no RAS. Recently admitted with CHF after missing Lasix; cardiac MRI showed ejection fraction 61%, moderate left ventricular hypertrophy, large pericardial effusion and mildly reduced RV function. Last echocardiogram in January 2016 showed normal LV function, grade 2 diastolic dysfunction, mildly reduced RV function. Moderate pericardial effusion. Pericardial effusion felt possibly related to minoxidil which was stopped. Since I last saw her, the patient denies any dyspnea on exertion, orthopnea, PND, pedal edema, palpitations, syncope or chest pain.   Current Outpatient Prescriptions  Medication Sig Dispense Refill  . aspirin 81 MG tablet Take 81 mg by mouth daily.      . canagliflozin (INVOKANA) 100 MG TABS tablet Take 1 tablet (100 mg total) by mouth daily. 90 tablet 1  . Cholecalciferol (VITAMIN D-3) 5000 UNITS TABS Take 1 tablet by mouth daily.    . cloNIDine (CATAPRES - DOSED IN MG/24 HR) 0.2 mg/24hr patch Place 1 patch (0.2 mg total) onto the skin every 7 (seven) days. 4 patch 2  . cyclobenzaprine (FLEXERIL) 10 MG tablet Take 1 tablet (10 mg total) by mouth 2 (two) times daily as needed for muscle spasms. 20 tablet 0  . furosemide (LASIX) 40 MG tablet Take 1 tablet (40 mg total) by mouth 2 (two) times daily. 60 tablet 0  . glucose blood test strip Freestyle lite test strips, check sugar 3 times daily. DX 250.41 100 each 12  . hydrALAZINE (APRESOLINE) 100 MG tablet Take 1 tablet (100 mg total) by mouth every 8 (eight) hours. 90 tablet 2  . Insulin Aspart Prot & Aspart (NOVOLOG MIX 70/30 FLEXPEN) (70-30) 100 UNIT/ML Pen Inject 20 Units into the skin 2 (two) times daily. 15 mL 11  . labetalol (NORMODYNE) 200 MG tablet Take 1 tablet (200 mg total) by mouth 2 (two) times daily. 60 tablet 0  . Lancets MISC Use as directed    . losartan (COZAAR) 50 MG tablet Take 1 tablet (50 mg total) by  mouth 2 (two) times daily. 60 tablet 0  . Magnesium 250 MG TABS Take 1 tablet by mouth daily.    . metFORMIN (GLUCOPHAGE) 1000 MG tablet Take 1 tablet (1,000 mg total) by mouth 2 (two) times daily. (Patient taking differently: Take 1,000 mg by mouth daily with breakfast. ) 180 tablet 2  . naproxen sodium (ANAPROX) 220 MG tablet Take 220 mg by mouth as needed.    . potassium chloride SA (K-DUR,KLOR-CON) 20 MEQ tablet Take 1 tablet (20 mEq total) by mouth daily. 90 tablet 2  . rosuvastatin (CRESTOR) 20 MG tablet Take 1 tablet (20 mg total) by mouth daily. 90 tablet 1   No current facility-administered medications for this visit.     Past Medical History  Diagnosis Date  . Obesity   . Elevated cholesterol   . Vitamin D deficiency   . DM (diabetes mellitus)   . Hypertension   . CVA (cerebral infarction) 1997    no residual deficit  . Anemia   . Renal insufficiency   . Vitamin D deficiency   . Diverticulitis   . Stroke 1997    Past Surgical History  Procedure Laterality Date  . Cesarean section      x 2    History   Social History  . Marital Status: Married    Spouse Name: N/A    Number of Children: 3  . Years of Education: N/A  Occupational History  .      Unemployed; used to work as a Radio broadcast assistant   Social History Main Topics  . Smoking status: Never Smoker   . Smokeless tobacco: Never Used  . Alcohol Use: No  . Drug Use: No  . Sexual Activity: Yes    Birth Control/ Protection: None   Other Topics Concern  . Not on file   Social History Narrative    ROS: no fevers or chills, productive cough, hemoptysis, dysphasia, odynophagia, melena, hematochezia, dysuria, hematuria, rash, seizure activity, orthopnea, PND, pedal edema, claudication. Remaining systems are negative.  Physical Exam: Well-developed well-nourished in no acute distress.  Skin is warm and dry.  HEENT is normal.  Neck is supple.  Chest is clear to auscultation with normal  expansion.  Cardiovascular exam is regular rate and rhythm.  Abdominal exam nontender or distended. No masses palpated. Extremities show no edema. neuro grossly intact

## 2014-12-28 ENCOUNTER — Ambulatory Visit (INDEPENDENT_AMBULATORY_CARE_PROVIDER_SITE_OTHER): Payer: BLUE CROSS/BLUE SHIELD | Admitting: Cardiology

## 2014-12-28 ENCOUNTER — Encounter: Payer: Self-pay | Admitting: Cardiology

## 2014-12-28 VITALS — BP 146/76 | HR 88 | Ht 59.5 in | Wt 167.7 lb

## 2014-12-28 DIAGNOSIS — I3139 Other pericardial effusion (noninflammatory): Secondary | ICD-10-CM

## 2014-12-28 DIAGNOSIS — E785 Hyperlipidemia, unspecified: Secondary | ICD-10-CM

## 2014-12-28 DIAGNOSIS — I313 Pericardial effusion (noninflammatory): Secondary | ICD-10-CM

## 2014-12-28 DIAGNOSIS — I10 Essential (primary) hypertension: Secondary | ICD-10-CM

## 2014-12-28 DIAGNOSIS — I319 Disease of pericardium, unspecified: Secondary | ICD-10-CM

## 2014-12-28 LAB — BASIC METABOLIC PANEL WITH GFR
BUN: 35 mg/dL — ABNORMAL HIGH (ref 6–23)
CO2: 31 mEq/L (ref 19–32)
Calcium: 9.6 mg/dL (ref 8.4–10.5)
Chloride: 99 mEq/L (ref 96–112)
Creat: 1.67 mg/dL — ABNORMAL HIGH (ref 0.50–1.10)
GFR, Est African American: 38 mL/min — ABNORMAL LOW
GFR, Est Non African American: 33 mL/min — ABNORMAL LOW
Glucose, Bld: 84 mg/dL (ref 70–99)
Potassium: 3.8 mEq/L (ref 3.5–5.3)
Sodium: 139 mEq/L (ref 135–145)

## 2014-12-28 MED ORDER — LABETALOL HCL 300 MG PO TABS: 300.0000 mg | ORAL_TABLET | Freq: Two times a day (BID) | ORAL | Status: DC

## 2014-12-28 NOTE — Patient Instructions (Signed)
Your physician recommends that you schedule a follow-up appointment in: 4 MONTHS WITH DR CRENSHAW  INCREASE LABETALOL TO 300 MG TWICE DAILY=1 AND 1/2 OF THE 200 MG TABLET TWICE DAILY  Your physician recommends that you HAVE LAB WORK TODAY

## 2014-12-28 NOTE — Assessment & Plan Note (Signed)
Patient will need follow-up echocardiogram in 4 months. Effusion is felt secondary to minoxidil which has been discontinued.

## 2014-12-28 NOTE — Assessment & Plan Note (Signed)
Continue statin. 

## 2014-12-28 NOTE — Assessment & Plan Note (Signed)
Patient with history of diastolic congestive heart failure. Much improved after resuming Lasix. Continue present dose. Check potassium and renal function.

## 2014-12-28 NOTE — Assessment & Plan Note (Signed)
Blood pressure is elevated. Increase labetalol to 300 mg by mouth twice a day.

## 2014-12-29 ENCOUNTER — Telehealth: Payer: Self-pay | Admitting: *Deleted

## 2014-12-29 ENCOUNTER — Telehealth: Payer: Self-pay | Admitting: Cardiology

## 2014-12-29 NOTE — Telephone Encounter (Signed)
Spoke with [employer] regarding diabetic supplies.

## 2014-12-29 NOTE — Telephone Encounter (Signed)
Spoke with pharmacist at [facility] to supply DX-E10.22, insulin dependent and testing glucose 3 times a day in order for patient to receive her testing supplies.

## 2014-12-29 NOTE — Telephone Encounter (Signed)
Called [facility] and clarified most recent Rx.

## 2014-12-29 NOTE — Telephone Encounter (Signed)
Jody Nguyen called in stating that she needed clarity on a prescription. She says that there is a prescription written for Labetalol and it has two different strengths, 200 and 300 mg. Please follow up  Thanks

## 2014-12-30 ENCOUNTER — Other Ambulatory Visit: Payer: Self-pay | Admitting: *Deleted

## 2014-12-30 MED ORDER — LOSARTAN POTASSIUM 50 MG PO TABS: 50.0000 mg | ORAL_TABLET | Freq: Two times a day (BID) | ORAL | Status: DC

## 2015-01-07 ENCOUNTER — Other Ambulatory Visit: Payer: Self-pay | Admitting: *Deleted

## 2015-01-07 DIAGNOSIS — N289 Disorder of kidney and ureter, unspecified: Secondary | ICD-10-CM

## 2015-01-07 MED ORDER — FUROSEMIDE 40 MG PO TABS: 40.0000 mg | ORAL_TABLET | Freq: Every day | ORAL | Status: DC

## 2015-01-20 ENCOUNTER — Other Ambulatory Visit: Payer: Self-pay | Admitting: Emergency Medicine

## 2015-01-27 ENCOUNTER — Ambulatory Visit (INDEPENDENT_AMBULATORY_CARE_PROVIDER_SITE_OTHER): Payer: BLUE CROSS/BLUE SHIELD | Admitting: Physician Assistant

## 2015-01-27 ENCOUNTER — Encounter: Payer: Self-pay | Admitting: Physician Assistant

## 2015-01-27 ENCOUNTER — Ambulatory Visit: Payer: Self-pay | Admitting: Physician Assistant

## 2015-01-27 VITALS — BP 140/80 | HR 80 | Temp 97.9°F | Resp 16 | Ht 59.5 in | Wt 178.0 lb

## 2015-01-27 DIAGNOSIS — E559 Vitamin D deficiency, unspecified: Secondary | ICD-10-CM

## 2015-01-27 DIAGNOSIS — Z79899 Other long term (current) drug therapy: Secondary | ICD-10-CM

## 2015-01-27 DIAGNOSIS — E785 Hyperlipidemia, unspecified: Secondary | ICD-10-CM

## 2015-01-27 DIAGNOSIS — I5032 Chronic diastolic (congestive) heart failure: Secondary | ICD-10-CM

## 2015-01-27 DIAGNOSIS — E669 Obesity, unspecified: Secondary | ICD-10-CM

## 2015-01-27 DIAGNOSIS — N289 Disorder of kidney and ureter, unspecified: Secondary | ICD-10-CM

## 2015-01-27 DIAGNOSIS — N182 Chronic kidney disease, stage 2 (mild): Secondary | ICD-10-CM

## 2015-01-27 DIAGNOSIS — I10 Essential (primary) hypertension: Secondary | ICD-10-CM

## 2015-01-27 LAB — LIPID PANEL
Cholesterol: 159 mg/dL (ref 0–200)
HDL: 57 mg/dL (ref >=46)
LDL Cholesterol: 82 mg/dL (ref 0–99)
Total CHOL/HDL Ratio: 2.8 Ratio
Triglycerides: 101 mg/dL (ref <150)
VLDL: 20 mg/dL (ref 0–40)

## 2015-01-27 LAB — CBC WITH DIFFERENTIAL/PLATELET
Basophils Absolute: 0 10*3/uL (ref 0.0–0.1)
Basophils Relative: 1 % (ref 0–1)
Eosinophils Absolute: 0.1 10*3/uL (ref 0.0–0.7)
Eosinophils Relative: 4 % (ref 0–5)
HCT: 35.7 % — ABNORMAL LOW (ref 36.0–46.0)
Hemoglobin: 11.3 g/dL — ABNORMAL LOW (ref 12.0–15.0)
Lymphocytes Relative: 36 % (ref 12–46)
Lymphs Abs: 1.3 10*3/uL (ref 0.7–4.0)
MCH: 24.9 pg — ABNORMAL LOW (ref 26.0–34.0)
MCHC: 31.7 g/dL (ref 30.0–36.0)
MCV: 78.8 fL (ref 78.0–100.0)
MPV: 10.1 fL (ref 8.6–12.4)
Monocytes Absolute: 0.4 10*3/uL (ref 0.1–1.0)
Monocytes Relative: 11 % (ref 3–12)
Neutro Abs: 1.8 10*3/uL (ref 1.7–7.7)
Neutrophils Relative %: 48 % (ref 43–77)
Platelets: 248 10*3/uL (ref 150–400)
RBC: 4.53 MIL/uL (ref 3.87–5.11)
RDW: 18.3 % — ABNORMAL HIGH (ref 11.5–15.5)
WBC: 3.7 10*3/uL — ABNORMAL LOW (ref 4.0–10.5)

## 2015-01-27 LAB — HEPATIC FUNCTION PANEL
ALT: 15 U/L (ref 0–35)
AST: 15 U/L (ref 0–37)
Albumin: 4 g/dL (ref 3.5–5.2)
Alkaline Phosphatase: 50 U/L (ref 39–117)
Bilirubin, Direct: 0.1 mg/dL (ref 0.0–0.3)
Indirect Bilirubin: 0.4 mg/dL (ref 0.2–1.2)
Total Bilirubin: 0.5 mg/dL (ref 0.2–1.2)
Total Protein: 7.1 g/dL (ref 6.0–8.3)

## 2015-01-27 LAB — BASIC METABOLIC PANEL WITH GFR
BUN: 23 mg/dL (ref 6–23)
CO2: 31 mEq/L (ref 19–32)
Calcium: 9.8 mg/dL (ref 8.4–10.5)
Chloride: 99 mEq/L (ref 96–112)
Creat: 1.35 mg/dL — ABNORMAL HIGH (ref 0.50–1.10)
GFR, Est African American: 50 mL/min — ABNORMAL LOW
GFR, Est Non African American: 43 mL/min — ABNORMAL LOW
Glucose, Bld: 67 mg/dL — ABNORMAL LOW (ref 70–99)
Potassium: 3.7 mEq/L (ref 3.5–5.3)
Sodium: 139 mEq/L (ref 135–145)

## 2015-01-27 LAB — HEMOGLOBIN A1C
Hgb A1c MFr Bld: 6.3 % — ABNORMAL HIGH (ref <5.7)
Mean Plasma Glucose: 134 mg/dL — ABNORMAL HIGH (ref <117)

## 2015-01-27 LAB — TSH: TSH: 2.686 u[IU]/mL (ref 0.350–4.500)

## 2015-01-27 LAB — MAGNESIUM: Magnesium: 2 mg/dL (ref 1.5–2.5)

## 2015-01-27 NOTE — Patient Instructions (Signed)
We are starting you on Metformin to prevent or treat diabetes. Metformin does not cause low blood sugars. In order to create energy your cells need insulin and sugar but sometime your cells do not accept the insulin and this can cause increased sugars and decreased energy. The Metformin helps your cells accept insulin and the sugar to give you more energy.   The two most common side effects are nausea and diarrhea, follow these rules to avoid it! You can take imodium per box instructions when starting metformin if needed.   Rules of metformin: 1) start out slow with only one pill daily. Our goal for you is 4 pills a day or 2000mg  total.  2) take with your largest meal. 3) Take with least amount of carbs.   Call if you have any problems.   Diabetes is a very complicated disease...lets simplify it.  An easy way to look at it to understand the complications is if you think of the extra sugar floating in your blood stream as glass shards floating through your blood stream.    Diabetes affects your small vessels first: 1) The glass shards (sugar) scraps down the tiny blood vessels in your eyes and lead to diabetic retinopathy, the leading cause of blindness in the [place]. Diabetes is the leading cause of newly diagnosed adult (47 to 60 years of age) blindness in the [place].  2) The glass shards scratches down the tiny vessels of your legs leading to nerve damage called neuropathy and can lead to amputations of your feet. More than 60% of all non-traumatic amputations of lower limbs occur in people with diabetes.  3) Over time the small vessels in your brain are shredded and closed off, individually this does not cause any problems but over a long period of time many of the small vessels being blocked can lead to Vascular Dementia.   4) Your kidney's are a filter system and have a "net" that keeps certain things in the body and lets bad things out. Sugar shreds this net and leads to kidney damage  and eventually failure. Decreasing the sugar that is destroying the net and certain blood pressure medications can help stop or decrease progression of kidney disease. Diabetes was the primary cause of kidney failure in 44 percent of all new cases in 2011.  5) Diabetes also destroys the small vessels in your penis that lead to erectile dysfunction. Eventually the vessels are so damaged that you may not be responsive to cialis or viagra.   Diabetes and your large vessels: Your larger vessels consist of your coronary arteries in your heart and the carotid vessels to your brain. Diabetes or even increased sugars put you at 300% increased risk of heart attack and stroke and this is why.. The sugar scrapes down your large blood vessels and your body sees this as an internal injury and tries to repair itself. Just like you get a scab on your skin, your platelets will stick to the blood vessel wall trying to heal it. This is why we have diabetics on low dose aspirin daily, this prevents the platelets from sticking and can prevent plaque formation. In addition, your body takes cholesterol and tries to shove it into the open wound. This is why we want your LDL, or bad cholesterol, below 70.   The combination of platelets and cholesterol over 5-10 years forms plaque that can break off and cause a heart attack or stroke.   PLEASE REMEMBER:  Diabetes is  preventable! Up to 77 percent of complications and morbidities among individuals with type 2 diabetes can be prevented, delayed, or effectively treated and minimized with regular visits to a health professional, appropriate monitoring and medication, and a healthy diet and lifestyle.  Before you even begin to attack a weight-loss plan, it pays to remember this: You are not fat. You have fat. Losing weight isn't about blame or shame; it's simply another achievement to accomplish. Dieting is like any other skill-you have to buckle down and work at it. As long as you  act in a smart, reasonable way, you'll ultimately get where you want to be. Here are some weight loss pearls for you.  1. It's Not a Diet. It's a Lifestyle Thinking of a diet as something you're on and suffering through only for the short term doesn't work. To shed weight and keep it off, you need to make permanent changes to the way you eat. It's OK to indulge occasionally, of course, but if you cut calories temporarily and then revert to your old way of eating, you'll gain back the weight quicker than you can say yo-yo. Use it to lose it. Research shows that one of the best predictors of long-term weight loss is how many pounds you drop in the first month. For that reason, nutritionists often suggest being stricter for the first two weeks of your new eating strategy to build momentum. Cut out added sugar and alcohol and avoid unrefined carbs. After that, figure out how you can reincorporate them in a way that's healthy and maintainable.  2. There's a Right Way to Exercise Working out burns calories and fat and boosts your metabolism by building muscle. But those trying to lose weight are notorious for overestimating the number of calories they burn and underestimating the amount they take in. Unfortunately, your system is biologically programmed to hold on to extra pounds and that means when you start exercising, your body senses the deficit and ramps up its hunger signals. If you're not diligent, you'll eat everything you burn and then some. Use it to lose it. Cardio gets all the exercise glory, but strength and interval training are the real heroes. They help you build lean muscle, which in turn increases your metabolism and calorie-burning ability 3. Don't Overreact to Mild Hunger Some people have a hard time losing weight because of hunger anxiety. To them, being hungry is bad-something to be avoided at all costs-so they carry snacks with them and eat when they don't need to. Others eat because they're  stressed out or bored. While you never want to get to the point of being ravenous (that's when bingeing is likely to happen), a hunger pang, a craving, or the fact that it's 3:00 p.m. should not send you racing for the vending machine or obsessing about the energy bar in your purse. Ideally, you should put off eating until your stomach is growling and it's difficult to concentrate.  Use it to lose it. When you feel the urge to eat, use the HALT method. Ask yourself, Am I really hungry? Or am I angry or anxious, lonely or bored, or tired? If you're still not certain, try the apple test. If you're truly hungry, an apple should seem delicious; if it doesn't, something else is going on. Or you can try drinking water and making yourself busy, if you are still hungry try a healthy snack.  4. Not All Calories Are Created Equal The mechanics of weight loss are pretty  simple: Take in fewer calories than you use for energy. But the kind of food you eat makes all the difference. Processed food that's high in saturated fat and refined starch or sugar can cause inflammation that disrupts the hormone signals that tell your brain you're full. The result: You eat a lot more.  Use it to lose it. Clean up your diet. Swap in whole, unprocessed foods, including vegetables, lean protein, and healthy fats that will fill you up and give you the biggest nutritional bang for your calorie buck. In a few weeks, as your brain starts receiving regular hunger and fullness signals once again, you'll notice that you feel less hungry overall and naturally start cutting back on the amount you eat.  5. Protein, Produce, and Plant-Based Fats Are Your Weight-Loss Trinity Here's why eating the three Ps regularly will help you drop pounds. Protein fills you up. You need it to build lean muscle, which keeps your metabolism humming so that you can torch more fat. People in a weight-loss program who ate double the recommended daily allowance for  protein (about 110 grams for a 150-pound woman) lost 70 percent of their weight from fat, while people who ate the RDA lost only about 40 percent, one study found. Produce is packed with filling fiber. "It's very difficult to consume too many calories if you're eating a lot of vegetables. Example: Three cups of broccoli is a lot of food, yet only 93 calories. (Fruit is another story. It can be easy to overeat and can contain a lot of calories from sugar, so be sure to monitor your intake.) Plant-based fats like olive oil and those in avocados and nuts are healthy and extra satiating.  Use it to lose it. Aim to incorporate each of the three Ps into every meal and snack. People who eat protein throughout the day are able to keep weight off, according to a study in the [place] of [employer]. In addition to meat, poultry and seafood, good sources are beans, lentils, eggs, tofu, and yogurt. As for fat, keep portion sizes in check by measuring out salad dressing, oil, and nut butters (shoot for one to two tablespoons). Finally, eat veggies or a little fruit at every meal. People who did that consumed 308 fewer calories but didn't feel any hungrier than when they didn't eat more produce.  7. How You Eat Is As Important As What You Eat In order for your brain to register that you're full, you need to focus on what you're eating. Sit down whenever you eat, preferably at a table. Turn off the TV or computer, put down your phone, and look at your food. Smell it. Chew slowly, and don't put another bite on your fork until you swallow. When women ate lunch this attentively, they consumed 30 percent less when snacking later than those who listened to an audiobook at lunchtime, according to a study in the [place] of Nutrition. 8. Weighing Yourself Really Works The scale provides the best evidence about whether your efforts are paying off. Seeing the numbers tick up or down or stagnate is motivation  to keep going-or to rethink your approach. A 2015 study at [facility] found that daily weigh-ins helped people lose more weight, keep it off, and maintain that loss, even after two years. Use it to lose it. Step on the scale at the same time every day for the best results. If your weight shoots up several pounds from one weigh-in to  the next, don't freak out. Eating a lot of salt the night before or having your period is the likely culprit. The number should return to normal in a day or two. It's a steady climb that you need to do something about. 9. Too Much Stress and Too Little Sleep Are Your Enemies When you're tired and frazzled, your body cranks up the production of cortisol, the stress hormone that can cause carb cravings. Not getting enough sleep also boosts your levels of ghrelin, a hormone associated with hunger, while suppressing leptin, a hormone that signals fullness and satiety. People on a diet who slept only five and a half hours a night for two weeks lost 55 percent less fat and were hungrier than those who slept eight and a half hours, according to a study in the [place]. Use it to lose it. Prioritize sleep, aiming for seven hours or more a night, which research shows helps lower stress. And make sure you're getting quality zzz's. If a snoring spouse or a fidgety cat wakes you up frequently throughout the night, you may end up getting the equivalent of just four hours of sleep, according to a study from [facility]. Keep pets out of the bedroom, and use a white-noise app to drown out snoring. 10. You Will Hit a plateau-And You Can Bust Through It As you slim down, your body releases much less leptin, the fullness hormone.  If you're not strength training, start right now. Building muscle can raise your metabolism to help you overcome a plateau. To keep your body challenged and burning calories, incorporate new moves and more intense intervals into  your workouts or add another sweat session to your weekly routine. Alternatively, cut an extra 100 calories or so a day from your diet. Now that you've lost weight, your body simply doesn't need as much fuel.   Ways to cut 100 calories  1. Eat your eggs with hot sauce OR salsa instead of cheese.  Eggs are great for breakfast, but many people consider eggs and cheese to be BFFs. Instead of cheese-1 oz. of cheddar has 114 calories-top your eggs with hot sauce, which contains no calories and helps with satiety and metabolism. Salsa is also a great option!!  2. Top your toast, waffles or pancakes with mashed berries instead of jelly or syrup. Half a cup of berries-fresh, frozen or thawed-has about 40 calories, compared with 2 tbsp. of maple syrup or jelly, which both have about 100 calories. The berries will also give you a good punch of fiber, which helps keep you full and satisfied and won't spike blood sugar quickly like the jelly or syrup. 3. Swap the non-fat latte for black coffee with a splash of half-and-half. Contrary to its name, that non-fat latte has 130 calories and a startling 19g of carbohydrates per 16 oz. serving. Replacing that 'light' drinkable dessert with a black coffee with a splash of half-and-half saves you more than 100 calories per 16 oz. serving. 4. Sprinkle salads with freeze-dried raspberries instead of dried cranberries. If you want a sweet addition to your nutritious salad, stay away from dried cranberries. They have a whopping 130 calories per  cup and 30g carbohydrates. Instead, sprinkle freeze-dried raspberries guilt-free and save more than 100 calories per  cup serving, adding 3g of belly-filling fiber. 5. Go for mustard in place of mayo on your sandwich. Mustard can add really nice flavor to any sandwich, and there are tons of varieties, from  spicy to honey. A serving of mayo is 95 calories, versus 10 calories in a serving of mustard. 6. Choose a DIY salad dressing  instead of the store-bought kind. Mix Dijon or whole grain mustard with low-fat Kefir or red wine vinegar and garlic. 7. Use hummus as a spread instead of a dip. Use hummus as a spread on a high-fiber cracker or tortilla with a sandwich and save on calories without sacrificing taste. 8. Pick just one salad "accessory." Salad isn't automatically a calorie winner. It's easy to over-accessorize with toppings. Instead of topping your salad with nuts, avocado and cranberries (all three will clock in at 313 calories), just pick one. The next day, choose a different accessory, which will also keep your salad interesting. You don't wear all your jewelry every day, right? 9. Ditch the white pasta in favor of spaghetti squash. One cup of cooked spaghetti squash has about 40 calories, compared with traditional spaghetti, which comes with more than 200. Spaghetti squash is also nutrient-dense. It's a good source of fiber and Vitamins A and C, and it can be eaten just like you would eat pasta-with a great tomato sauce and [place] meatballs or with pesto, tofu and spinach, for example. 10. Dress up your chili, soups and stews with non-fat [place] yogurt instead of sour cream. Just a 'dollop' of sour cream can set you back 115 calories and a whopping 12g of fat-seven of which are of the artery-clogging variety. Added bonus: [place] yogurt is packed with muscle-building protein, calcium and B Vitamins. 11. Mash cauliflower instead of mashed potatoes. One cup of traditional mashed potatoes-in all their creamy goodness-has more than 200 calories, compared to mashed cauliflower, which you can typically eat for less than 100 calories per 1 cup serving. Cauliflower is a great source of the antioxidant indole-3-carbinol (I3C), which may help reduce the risk of some cancers, like breast cancer. 12. Ditch the ice cream sundae in favor of a [place] yogurt parfait. Instead of a cup of ice cream or fro-yo for dessert, try 1 cup of  nonfat Greek yogurt topped with fresh berries and a sprinkle of cacao nibs. Both toppings are packed with antioxidants, which can help reduce cellular inflammation and oxidative damage. And the comparison is a no-brainer: One cup of ice cream has about 275 calories; one cup of frozen yogurt has about 230; and a cup of Greek yogurt has just 130, plus twice the protein, so you're less likely to return to the freezer for a second helping. 13. Put olive oil in a spray container instead of using it directly from the bottle. Each tablespoon of olive oil is 120 calories and 15g of fat. Use a mister instead of pouring it straight into the pan or onto a salad. This allows for portion control and will save you more than 100 calories. 14. When baking, substitute canned pumpkin for butter or oil. Canned pumpkin-not pumpkin pie mix-is loaded with Vitamin A, which is important for skin and eye health, as well as immunity. And the comparisons are pretty crazy:  cup of canned pumpkin has about 40 calories, compared to butter or oil, which has more than 800 calories. Yes, 800 calories. Applesauce and mashed banana can also serve as good substitutions for butter or oil, usually in a 1:1 ratio. 15. Top casseroles with high-fiber cereal instead of breadcrumbs. Breadcrumbs are typically made with white bread, while breakfast cereals contain 5-9g of fiber per serving. Not only will you save more than 150 calories  per  cup serving, the swap will also keep you more full and you'll get a metabolism boost from the added fiber. 16. Snack on pistachios instead of macadamia nuts. Believe it or not, you get the same amount of calories from 35 pistachios (100 calories) as you would from only five macadamia nuts. 17. Chow down on kale chips rather than potato chips. This is my favorite 'don't knock it 'till you try it' swap. Kale chips are so easy to make at home, and you can spice them up with a little grated parmesan or chili powder.  Plus, they're a mere fraction of the calories of potato chips, but with the same crunch factor we crave so often. 18. Add seltzer and some fruit slices to your cocktail instead of soda or fruit juice. One cup of soda or fruit juice can pack on as much as 140 calories. Instead, use seltzer and fruit slices. The fruit provides valuable phytochemicals, such as flavonoids and anthocyanins, which help to combat cancer and stave off the aging process.  I think it is possible that you have sleep apnea. It can cause interrupted sleep, headaches, frequent awakenings, fatigue, dry mouth, fast/slow heart beats, memory issues, anxiety/depression, swelling, numbness tingling hands/feet, weight gain, shortness of breath, and the list goes on. Sleep apnea needs to be ruled out because if it is left untreated it does eventually lead to abnormal heart beats, lung failure or heart failure as well as increasing the risk of heart attack and stroke. There are masks you can wear OR a mouth piece that I can give you information about. Often times though people feel MUCH better after getting treatment.   Sleep Apnea  Sleep apnea is a sleep disorder characterized by abnormal pauses in breathing while you sleep. When your breathing pauses, the level of oxygen in your blood decreases. This causes you to move out of deep sleep and into light sleep. As a result, your quality of sleep is poor, and the system that carries your blood throughout your body (cardiovascular system) experiences stress. If sleep apnea remains untreated, the following conditions can develop:  High blood pressure (hypertension).  Coronary artery disease.  Inability to achieve or maintain an erection (impotence).  Impairment of your thought process (cognitive dysfunction). There are three types of sleep apnea: 1. Obstructive sleep apnea--Pauses in breathing during sleep because of a blocked airway. 2. Central sleep apnea--Pauses in breathing during sleep  because the area of the brain that controls your breathing does not send the correct signals to the muscles that control breathing. 3. Mixed sleep apnea--A combination of both obstructive and central sleep apnea.  RISK FACTORS The following risk factors can increase your risk of developing sleep apnea:  Being overweight.  Smoking.  Having narrow passages in your nose and throat.  Being of older age.  Being female.  Alcohol use.  Sedative and tranquilizer use.  Ethnicity. Among individuals younger than 35 years, African Americans are at increased risk of sleep apnea. SYMPTOMS   Difficulty staying asleep.  Daytime sleepiness and fatigue.  Loss of energy.  Irritability.  Loud, heavy snoring.  Morning headaches.  Trouble concentrating.  Forgetfulness.  Decreased interest in sex. DIAGNOSIS  In order to diagnose sleep apnea, your caregiver will perform a physical examination. Your caregiver may suggest that you take a home sleep test. Your caregiver may also recommend that you spend the night in a sleep lab. In the sleep lab, several monitors record information about your heart, lungs,  and brain while you sleep. Your leg and arm movements and blood oxygen level are also recorded. TREATMENT The following actions may help to resolve mild sleep apnea:  Sleeping on your side.   Using a decongestant if you have nasal congestion.   Avoiding the use of depressants, including alcohol, sedatives, and narcotics.   Losing weight and modifying your diet if you are overweight. There also are devices and treatments to help open your airway:  Oral appliances. These are custom-made mouthpieces that shift your lower jaw forward and slightly open your bite. This opens your airway.  Devices that create positive airway pressure. This positive pressure "splints" your airway open to help you breathe better during sleep. The following devices create positive airway pressure:  Continuous  positive airway pressure (CPAP) device. The CPAP device creates a continuous level of air pressure with an air pump. The air is delivered to your airway through a mask while you sleep. This continuous pressure keeps your airway open.  Nasal expiratory positive airway pressure (EPAP) device. The EPAP device creates positive air pressure as you exhale. The device consists of single-use valves, which are inserted into each nostril and held in place by adhesive. The valves create very little resistance when you inhale but create much more resistance when you exhale. That increased resistance creates the positive airway pressure. This positive pressure while you exhale keeps your airway open, making it easier to breath when you inhale again.  Bilevel positive airway pressure (BPAP) device. The BPAP device is used mainly in patients with central sleep apnea. This device is similar to the CPAP device because it also uses an air pump to deliver continuous air pressure through a mask. However, with the BPAP machine, the pressure is set at two different levels. The pressure when you exhale is lower than the pressure when you inhale.  Surgery. Typically, surgery is only done if you cannot comply with less invasive treatments or if the less invasive treatments do not improve your condition. Surgery involves removing excess tissue in your airway to create a wider passage way. Document Released: 11/03/2002 Document Revised: 03/10/2013 Document Reviewed: 03/21/2012 [facility] Patient Information 2015 [place], [place]. This information is not intended to replace advice given to you by your health care provider. Make sure you discuss any questions you have with your health care provider.

## 2015-01-27 NOTE — Progress Notes (Signed)
Assessment and Plan:  Hypertension: Continue medication, monitor blood pressure at home. Continue DASH diet.  Reminder to go to the ER if any CP, SOB, nausea, dizziness, severe HA, changes vision/speech, left arm numbness and tingling and jaw pain. Cholesterol: Continue diet and exercise. Check cholesterol.  Diabetes with diabetic chronic kidney disease and with other circulatory complications-Continue diet and exercise. Check A1C Vitamin D Def- check level and continue medications.  Obesity with co morbidities- long discussion about weight loss, diet, and exercise, crowded mouth-? OSA but declines testing Nausea in AM- ? From MF- will start taking with largest meal  Continue diet and meds as discussed. Further disposition pending results of labs. Discussed med's effects and SE's.    HPI 59 y.o. female  presents for 3 month follow up with hypertension, hyperlipidemia, diabetes and vitamin D.  Her blood pressure has been controlled at home, since increase in labetolol to 300mg  BID, today their BP is BP: 140/80 mmHg.  She does not workout but want to join an exercise class. She denies chest pain, shortness of breath, dizziness. She has diastolic CHF, follows with Dr. Stanford Breed, BP has been poorly controlled until recently negative RAS. She had normal cardiolite 2012, had had CVA in the past, she is on bASA.   She is on cholesterol medication and denies myalgias. Her cholesterol is not at goal. The cholesterol was:  10/29/2014: Cholesterol, Total 155; HDL-C 57; LDL (calc) 81; Triglycerides 84  She has been working on diet and exercise for diabetes with diabetic chronic kidney disease, she is on bASA, she is on ACE/ARB, and denies  polydipsia, polyuria and visual disturbances. Sugars are running 95-125 at home. Last A1C was: 11/22/2014: Hemoglobin-A1c 6.7*  Patient is on Vitamin D supplement. 10/29/2014: VITD 46  BMI is Body mass index is 35.36 kg/(m^2)., she is working on diet and exercise. Wt Readings  from Last 3 Encounters:  01/27/15 178 lb (80.74 kg)  12/28/14 167 lb 11.2 oz (76.068 kg)  11/30/14 183 lb (83.008 kg)    Current Medications:  Current Outpatient Prescriptions on File Prior to Visit  Medication Sig Dispense Refill  . aspirin 81 MG tablet Take 81 mg by mouth daily.      . canagliflozin (INVOKANA) 100 MG TABS tablet Take 1 tablet (100 mg total) by mouth daily. 90 tablet 1  . Cholecalciferol (VITAMIN D-3) 5000 UNITS TABS Take 1 tablet by mouth daily.    . cloNIDine (CATAPRES - DOSED IN MG/24 HR) 0.2 mg/24hr patch Place 1 patch (0.2 mg total) onto the skin every 7 (seven) days. 4 patch 2  . cyclobenzaprine (FLEXERIL) 10 MG tablet Take 1 tablet (10 mg total) by mouth 2 (two) times daily as needed for muscle spasms. 20 tablet 0  . furosemide (LASIX) 40 MG tablet Take 1 tablet (40 mg total) by mouth daily. 60 tablet 0  . glucose blood test strip Freestyle lite test strips, check sugar 3 times daily. DX 250.41 100 each 12  . hydrALAZINE (APRESOLINE) 100 MG tablet Take 1 tablet (100 mg total) by mouth every 8 (eight) hours. 90 tablet 2  . Insulin Aspart Prot & Aspart (NOVOLOG MIX 70/30 FLEXPEN) (70-30) 100 UNIT/ML Pen Inject 20 Units into the skin 2 (two) times daily. 15 mL 11  . labetalol (NORMODYNE) 300 MG tablet Take 1 tablet (300 mg total) by mouth 2 (two) times daily. 180 tablet 3  . Lancets MISC Use as directed    . losartan (COZAAR) 50 MG tablet Take 1  tablet (50 mg total) by mouth 2 (two) times daily. 180 tablet 0  . Magnesium 250 MG TABS Take 1 tablet by mouth daily.    . metFORMIN (GLUCOPHAGE) 1000 MG tablet TAKE 1 BY MOUTH TWICE DAILY --MELISSA R SMITH 180 tablet 0  . naproxen sodium (ANAPROX) 220 MG tablet Take 220 mg by mouth as needed.    . potassium chloride SA (K-DUR,KLOR-CON) 20 MEQ tablet Take 1 tablet (20 mEq total) by mouth daily. 90 tablet 2  . rosuvastatin (CRESTOR) 20 MG tablet Take 1 tablet (20 mg total) by mouth daily. 90 tablet 1   No current  facility-administered medications on file prior to visit.   Medical History:  Past Medical History  Diagnosis Date  . Obesity   . Elevated cholesterol   . Vitamin D deficiency   . DM (diabetes mellitus)   . Hypertension   . CVA (cerebral infarction) 1997    no residual deficit  . Anemia   . Renal insufficiency   . Vitamin D deficiency   . Diverticulitis   . Stroke 1997   Allergies:  Allergies  Allergen Reactions  . Ace Inhibitors   . Lipitor [Atorvastatin]     Tolerates rosuvastatin  . Minoxidil     Pericardial effusion     Review of Systems:  Review of Systems  Constitutional: Positive for malaise/fatigue. Negative for fever, chills, weight loss and diaphoresis.  HENT: Negative.   Respiratory: Positive for shortness of breath. Negative for cough, hemoptysis, sputum production and wheezing.   Cardiovascular: Negative.   Gastrointestinal: Positive for nausea (2-3 hours after taking morning meds). Negative for heartburn, vomiting, abdominal pain, diarrhea, constipation, blood in stool and melena.  Genitourinary: Negative.   Musculoskeletal: Negative.   Skin: Negative.   Neurological: Negative.  Negative for weakness.  Psychiatric/Behavioral: Negative.     Family history- Review and unchanged Social history- Review and unchanged Physical Exam: BP 140/80 mmHg  Pulse 80  Temp(Src) 97.9 F (36.6 C)  Resp 16  Ht 4' 11.5" (1.511 m)  Wt 178 lb (80.74 kg)  BMI 35.36 kg/m2 Wt Readings from Last 3 Encounters:  01/27/15 178 lb (80.74 kg)  12/28/14 167 lb 11.2 oz (76.068 kg)  11/30/14 183 lb (83.008 kg)   General Appearance: Well nourished, in no apparent distress. Eyes: PERRLA, EOMs, conjunctiva no swelling or erythema Sinuses: No Frontal/maxillary tenderness ENT/Mouth: Ext aud canals clear, TMs without erythema, bulging. No erythema, swelling, or exudate on post pharynx.  Tonsils not swollen or erythematous. Hearing normal. Crowded mouth.  Neck: Supple, thyroid  normal.  Respiratory: Respiratory effort normal, BS equal bilaterally without rales, rhonchi, wheezing or stridor.  Cardio: RRR with no MRGs, prominent S2. Brisk peripheral pulses with 1+ edema.  Abdomen: Soft, + BS, obese  Non tender, no guarding, rebound, hernias, masses. Lymphatics: Non tender without lymphadenopathy.  Musculoskeletal: Full ROM, 5/5 strength, Normal gait Skin: Warm, dry without rashes, lesions, ecchymosis.  Neuro: Cranial nerves intact. No cerebellar symptoms.  Psych: Awake and oriented X 3, normal affect, Insight and Judgment appropriate.    Vicie Mutters, PA-C 10:47 AM [facility]

## 2015-01-28 LAB — VITAMIN D 25 HYDROXY (VIT D DEFICIENCY, FRACTURES): Vit D, 25-Hydroxy: 47 ng/mL (ref 30–100)

## 2015-03-12 ENCOUNTER — Other Ambulatory Visit: Payer: Self-pay | Admitting: *Deleted

## 2015-03-18 ENCOUNTER — Other Ambulatory Visit: Payer: Self-pay | Admitting: *Deleted

## 2015-03-18 MED ORDER — HYDRALAZINE HCL 100 MG PO TABS: 100.0000 mg | ORAL_TABLET | Freq: Three times a day (TID) | ORAL | Status: DC

## 2015-03-22 ENCOUNTER — Other Ambulatory Visit: Payer: Self-pay | Admitting: Physician Assistant

## 2015-03-22 DIAGNOSIS — I10 Essential (primary) hypertension: Secondary | ICD-10-CM

## 2015-03-22 MED ORDER — CLONIDINE HCL 0.2 MG/24HR TD PTWK: 0.2000 mg | MEDICATED_PATCH | TRANSDERMAL | Status: DC

## 2015-03-29 ENCOUNTER — Encounter: Payer: Self-pay | Admitting: Internal Medicine

## 2015-03-29 ENCOUNTER — Ambulatory Visit (INDEPENDENT_AMBULATORY_CARE_PROVIDER_SITE_OTHER): Payer: BLUE CROSS/BLUE SHIELD | Admitting: Internal Medicine

## 2015-03-29 VITALS — BP 142/90 | HR 76 | Temp 97.0°F | Resp 16 | Ht 60.0 in | Wt 184.6 lb

## 2015-03-29 DIAGNOSIS — Z111 Encounter for screening for respiratory tuberculosis: Secondary | ICD-10-CM

## 2015-03-29 DIAGNOSIS — Z79899 Other long term (current) drug therapy: Secondary | ICD-10-CM

## 2015-03-29 DIAGNOSIS — E785 Hyperlipidemia, unspecified: Secondary | ICD-10-CM

## 2015-03-29 DIAGNOSIS — E559 Vitamin D deficiency, unspecified: Secondary | ICD-10-CM

## 2015-03-29 DIAGNOSIS — Z1212 Encounter for screening for malignant neoplasm of rectum: Secondary | ICD-10-CM

## 2015-03-29 DIAGNOSIS — E1029 Type 1 diabetes mellitus with other diabetic kidney complication: Secondary | ICD-10-CM

## 2015-03-29 DIAGNOSIS — E669 Obesity, unspecified: Secondary | ICD-10-CM | POA: Insufficient documentation

## 2015-03-29 DIAGNOSIS — R5383 Other fatigue: Secondary | ICD-10-CM

## 2015-03-29 DIAGNOSIS — I152 Hypertension secondary to endocrine disorders: Secondary | ICD-10-CM

## 2015-03-29 DIAGNOSIS — N189 Chronic kidney disease, unspecified: Secondary | ICD-10-CM

## 2015-03-29 DIAGNOSIS — D631 Anemia in chronic kidney disease: Secondary | ICD-10-CM

## 2015-03-29 LAB — CBC WITH DIFFERENTIAL/PLATELET
Basophils Absolute: 0 10*3/uL (ref 0.0–0.1)
Basophils Relative: 0 % (ref 0–1)
Eosinophils Absolute: 0.1 10*3/uL (ref 0.0–0.7)
Eosinophils Relative: 2 % (ref 0–5)
HCT: 37.1 % (ref 36.0–46.0)
Hemoglobin: 12.2 g/dL (ref 12.0–15.0)
Lymphocytes Relative: 19 % (ref 12–46)
Lymphs Abs: 1.1 10*3/uL (ref 0.7–4.0)
MCH: 26.8 pg (ref 26.0–34.0)
MCHC: 32.9 g/dL (ref 30.0–36.0)
MCV: 81.4 fL (ref 78.0–100.0)
MPV: 10.2 fL (ref 8.6–12.4)
Monocytes Absolute: 0.4 10*3/uL (ref 0.1–1.0)
Monocytes Relative: 6 % (ref 3–12)
Neutro Abs: 4.4 10*3/uL (ref 1.7–7.7)
Neutrophils Relative %: 73 % (ref 43–77)
Platelets: 310 10*3/uL (ref 150–400)
RBC: 4.56 MIL/uL (ref 3.87–5.11)
RDW: 16 % — ABNORMAL HIGH (ref 11.5–15.5)
WBC: 6 10*3/uL (ref 4.0–10.5)

## 2015-03-29 LAB — HEPATIC FUNCTION PANEL
ALT: 12 U/L (ref 0–35)
AST: 14 U/L (ref 0–37)
Albumin: 4.1 g/dL (ref 3.5–5.2)
Alkaline Phosphatase: 55 U/L (ref 39–117)
Bilirubin, Direct: 0.1 mg/dL (ref 0.0–0.3)
Indirect Bilirubin: 0.4 mg/dL (ref 0.2–1.2)
Total Bilirubin: 0.5 mg/dL (ref 0.2–1.2)
Total Protein: 7.8 g/dL (ref 6.0–8.3)

## 2015-03-29 LAB — BASIC METABOLIC PANEL WITH GFR
BUN: 37 mg/dL — ABNORMAL HIGH (ref 6–23)
CO2: 30 mEq/L (ref 19–32)
Calcium: 10 mg/dL (ref 8.4–10.5)
Chloride: 99 mEq/L (ref 96–112)
Creat: 1.6 mg/dL — ABNORMAL HIGH (ref 0.50–1.10)
GFR, Est African American: 40 mL/min — ABNORMAL LOW
GFR, Est Non African American: 35 mL/min — ABNORMAL LOW
Glucose, Bld: 118 mg/dL — ABNORMAL HIGH (ref 70–99)
Potassium: 4.1 mEq/L (ref 3.5–5.3)
Sodium: 138 mEq/L (ref 135–145)

## 2015-03-29 LAB — IRON AND TIBC
%SAT: 24 % (ref 20–55)
Iron: 81 ug/dL (ref 42–145)
TIBC: 332 ug/dL (ref 250–470)
UIBC: 251 ug/dL (ref 125–400)

## 2015-03-29 LAB — LIPID PANEL
Cholesterol: 190 mg/dL (ref 0–200)
HDL: 67 mg/dL (ref >=46)
LDL Cholesterol: 104 mg/dL — ABNORMAL HIGH (ref 0–99)
Total CHOL/HDL Ratio: 2.8 Ratio
Triglycerides: 94 mg/dL (ref <150)
VLDL: 19 mg/dL (ref 0–40)

## 2015-03-29 LAB — HEMOGLOBIN A1C
Hgb A1c MFr Bld: 5.7 % — ABNORMAL HIGH (ref <5.7)
Mean Plasma Glucose: 117 mg/dL — ABNORMAL HIGH (ref <117)

## 2015-03-29 LAB — TSH: TSH: 3.143 u[IU]/mL (ref 0.350–4.500)

## 2015-03-29 LAB — VITAMIN B12: Vitamin B-12: 799 pg/mL (ref 211–911)

## 2015-03-29 LAB — MAGNESIUM: Magnesium: 2.3 mg/dL (ref 1.5–2.5)

## 2015-03-29 MED ORDER — METFORMIN HCL ER 500 MG PO TB24: ORAL_TABLET | ORAL | Status: DC

## 2015-03-29 NOTE — Progress Notes (Signed)
Patient ID: Jody Nguyen, female   DOB: 1956-05-25, 59 y.o.   MRN: [id number]  Annual Comprehensive Examination  This very nice 59 y.o. [name] presents for complete physical.  Patient has been followed for HTN, T1_IDDM / CKD4 , Hyperlipidemia, and Vitamin D Deficiency.    HTN predates since 1999. Patient's BP has been controlled at home and patient denies any cardiac symptoms as chest pain, palpitations, shortness of breath, dizziness or ankle swelling. Patient has had a neg Myoview with EF 49% in 2012 , neg RAS by dopplers (2013) and Cardiac MRI w/o ischemia & EF 61%. She did have a moderate pericardial effusion felt due to Minoxidil which was discontinued with dissipation of the effusion.Today's BP 142/90 is sl elevated.     Patient's hyperlipidemia is controlled with diet and medications. Patient denies myalgias or other medication SE's. Last lipids were at goal - Total Chol 159; HDL 57; LDL  82; Triglycerides 101 on 01/27/2015.    Patient has Morbid Obesity (BMI 36+) and consequent T1_IDDM /CKD4 (GFR 35 ml/min) predating since 2006 and she reports CBG's usu less than 130 mg%.  and patient denies reactive hypoglycemic symptoms, visual blurring, diabetic polys, or paresthesias. Last A1c was 6.3% on 01/27/2015.    Finally, patient has history of Vitamin D Deficiency of 12 in 2008 and last Vitamin D was 47 on 01/27/2015.     Medication Sig  . aspirin 81 MG tablet Take 81 mg by mouth daily.    . canagliflozin (INVOKANA) 100 MG TABS tablet Take 1 tablet (100 mg total) by mouth daily.  . Cholecalciferol (VITAMIN D-3) 5000 UNITS TABS Take 1 tablet by mouth daily.  . cloNIDine (CATAPRES - DOSED IN MG/24 HR) 0.2 mg/24hr patch Place 1 patch (0.2 mg total) onto the skin every 7 (seven) days.  . cyclobenzaprine (FLEXERIL) 10 MG tablet Take 1 tablet (10 mg total) by mouth 2 (two) times daily as needed for muscle spasms.  . furosemide (LASIX) 40 MG tablet Take 1 tablet (40 mg total) by mouth daily.  Marland Kitchen glucose blood  test strip Freestyle lite test strips, check sugar 3 times daily. DX 250.41  . hydrALAZINE  100 MG tablet Take 1 tablet (100 mg total) by mouth every 8 (eight) hours.  Marland Kitchen NOVOLOG MIX 70/30 FLEXPEN Inject 20 Units into the skin 2 (two) times daily.  Marland Kitchen labetalol  300 MG tablet Take 1 tablet (300 mg total) by mouth 2 (two) times daily.  Marland Kitchen losartan  50 MG tablet Take 1 tablet (50 mg total) by mouth 2 (two) times daily.  . Magnesium 250 MG TABS Take 1 tablet by mouth daily.  . metFORMIN (GLUCOPHAGE) 1000 MG tablet TAKE 1 BY MOUTH TWICE DAILY --Jody Nguyen  . naproxen sodium  220 MG tablet Take 220 mg by mouth as needed.  . K Cl / K-DUR 20 MEQ tablet Take 1 tablet (20 mEq total) by mouth daily.  . rosuvastatin  20 MG tablet Take 1 tablet (20 mg total) by mouth daily.   Allergies  Allergen Reactions  . Ace Inhibitors   . Lipitor [Atorvastatin]     Tolerates rosuvastatin  . Minoxidil     Pericardial effusion   Past Medical History  Diagnosis Date  . Obesity   . Elevated cholesterol   . Vitamin D deficiency   . DM (diabetes mellitus)   . Hypertension   . CVA (cerebral infarction) 1997    no residual deficit  . Anemia   . Renal insufficiency   .  Vitamin D deficiency   . Diverticulitis   . Stroke 1997   Health Maintenance  Topic Date Due  . OPHTHALMOLOGY EXAM  03/04/1965  . PNEUMOCOCCAL POLYSACCHARIDE VACCINE (2) 07/28/2013  . PAP SMEAR  03/22/2015  . FOOT EXAM  03/26/2015  . ZOSTAVAX  03/05/2015  . URINE MICROALBUMIN  03/26/2015  . INFLUENZA VACCINE  06/28/2015  . HEMOGLOBIN A1C  07/30/2015  . MAMMOGRAM  05/22/2016  . TETANUS/TDAP  12/28/2018  . COLONOSCOPY  01/18/2022  . HIV Screening  Completed   Immunization History  Administered Date(s) Administered  . Influenza Split 08/20/2012  . PPD Test 03/25/2014, 03/29/2015  . Pneumococcal-Unspecified 07/28/2008  . Tdap 12/28/2008   Past Surgical History  Procedure Laterality Date  . Cesarean section      x 2   Family  History  Problem Relation Age of Onset  . Heart attack Brother     MI in his 75s  . Cancer Sister     breast cancer  . Breast cancer Sister   . Stroke Mother    History  Substance Use Topics  . Smoking status: Never Smoker   . Smokeless tobacco: Never Used  . Alcohol Use: No    ROS Constitutional: Denies fever, chills, weight loss/gain, headaches, insomnia,  night sweats, and change in appetite. Does c/o fatigue. Eyes: Denies redness, blurred vision, diplopia, discharge, itchy, watery eyes.  ENT: Denies discharge, congestion, post nasal drip, epistaxis, sore throat, earache, hearing loss, dental pain, Tinnitus, Vertigo, Sinus pain, snoring.  Cardio: Denies chest pain, palpitations, irregular heartbeat, syncope, dyspnea, diaphoresis, orthopnea, PND, claudication, edema Respiratory: denies cough, dyspnea, DOE, pleurisy, hoarseness, laryngitis, wheezing.  Gastrointestinal: Denies dysphagia, heartburn, reflux, water brash, pain, cramps, nausea, vomiting, bloating, diarrhea, constipation, hematemesis, melena, hematochezia, jaundice, hemorrhoids Genitourinary: Denies dysuria, frequency, urgency, nocturia, hesitancy, discharge, hematuria, flank pain Breast: Breast lumps, nipple discharge, bleeding.  Musculoskeletal: Denies arthralgia, myalgia, stiffness, Jt. Swelling, pain, limp, and strain/sprain. Denies falls. Skin: Denies puritis, rash, hives, warts, acne, eczema, changing in skin lesion Neuro: No weakness, tremor, incoordination, spasms, paresthesia, pain Psychiatric: Denies confusion, memory loss, sensory loss. Denies Depression. Endocrine: Denies change in weight, skin, hair change, nocturia, and paresthesia, diabetic polys, visual blurring, hyper / hypo glycemic episodes.  Heme/Lymph: No excessive bleeding, bruising, enlarged lymph nodes.  Physical Exam  BP 142/90   Pulse 76  Tem 97 F Resp 16  Ht 5'   Wt 184 lb 9.6 oz     BMI 36.05  General Appearance: Well nourished and in  no apparent distress. Eyes: PERRLA, EOMs, conjunctiva no swelling or erythema, normal fundi and vessels. Sinuses: No frontal/maxillary tenderness ENT/Mouth: EACs patent / TMs  nl. Nares clear without erythema, swelling, mucoid exudates. Oral hygiene is good. No erythema, swelling, or exudate. Tongue normal, non-obstructing. Tonsils not swollen or erythematous. Hearing normal.  Neck: Supple, thyroid normal. No bruits, nodes or JVD. Respiratory: Respiratory effort normal.  BS equal and clear bilateral without rales, rhonci, wheezing or stridor. Cardio: Heart sounds are normal with regular rate and rhythm and no murmurs, rubs or gallops. Peripheral pulses are normal and equal bilaterally without edema. No aortic or femoral bruits. Chest: symmetric with normal excursions and percussion. Breasts: Symmetric, without lumps, nipple discharge, retractions, or fibrocystic changes.  Abdomen: Flat, soft, with bowl sounds. Nontender, no guarding, rebound, hernias, masses, or organomegaly.  Lymphatics: Non tender without lymphadenopathy.  Genitourinary:  Musculoskeletal: Full ROM all peripheral extremities, joint stability, 5/5 strength, and normal gait. Skin: Warm and dry without rashes,  lesions, cyanosis, clubbing or  ecchymosis.  Neuro: Cranial nerves intact, reflexes equal bilaterally. Normal muscle tone, no cerebellar symptoms. Sensation intact.  Pysch: Awake and oriented X 3, normal affect, Insight and Judgment appropriate.   Assessment and Plan   1. Hypertension due to endocrine disorder  - EKG 12-Lead - [place], RETROPERITNL ABD,  LTD  2. Hyperlipidemia  - Lipid panel  3. T1_IDDM  w/CKD4 (GFR 35 ml/min)  - Microalbumin / creatinine urine ratio - HM DIABETES FOOT EXAM - LOW EXTREMITY NEUR EXAM DOCUM - Hemoglobin A1c - Pt is advised to begin gradual taper of her insulin to allow weighth loss by initially decreasing her Novolog 70/30 by 2 units each to 8 units bid .... et Ronney Asters.  4. Vitamin D  deficiency  - Vit D  25 hydroxy (rtn osteoporosis monitoring)  5. Anemia in chronic kidney disease   6. Screening for rectal cancer  - POC Hemoccult Bld/Stl (3-Cd Home Screen); Future  7. Other fatigue  - Vitamin B12 - Iron and TIBC - TSH  8. Morbid obesity (BMI 36.05)   - long discussion regarding importance ofweight loss  9. Medication management - Urine Microscopic - CBC with Differential/Platelet - BASIC METABOLIC PANEL WITH GFR - Hepatic function panel - Magnesium  10. Screening examination for pulmonary tuberculosis  - PPD   Continue prudent diet as discussed, weight control, BP monitoring, regular exercise, and medications. Discussed med's effects and SE's. Screening labs and tests as requested with regular follow-up as recommended.  Over 40 minutes of exam, counseling, chart review was performed.

## 2015-03-29 NOTE — Patient Instructions (Signed)
Recommend Low dose or baby Aspirin 81 mg daily   To reduce risk of Colon Cancer 20 %, Skin Cancer 26 % , Melanoma 46% and   Pancreatic cancer 60%  ++++++++++++++++++ Vitamin D goal is between 70-100.   Please make sure that you are taking your Vitamin D as directed.   It is very important as a natural antiinflammatory   helping hair, skin, and nails, as well as reducing stroke and heart attack risk.   It helps your bones and helps with mood.  It also decreases numerous cancer risks so please take it as directed.   Low Vit D is associated with a 200-300% higher risk for CANCER   and 200-300% higher risk for HEART   ATTACK  &  STROKE.    ......................................  It is also associated with higher death rate at younger ages,   autoimmune diseases like Rheumatoid arthritis, Lupus, Multiple Sclerosis.     Also many other serious conditions, like depression, Alzheimer's  Dementia, infertility, muscle aches, fatigue, fibromyalgia - just to name a few.  +++++++++++++++++++    Recommend the book "The END of DIETING" by Dr Joel Fuhrman   & the book "The END of DIABETES " by Dr Joel Fuhrman  At [employer] - get book & Audio CD's     Being diabetic has a  300% increased risk for heart attack, stroke, cancer, and alzheimer- type vascular dementia. It is very important that you work harder with diet by avoiding all foods that are white. Avoid white rice (brown & wild rice is OK), white potatoes (sweetpotatoes in moderation is OK), White bread or wheat bread or anything made out of white flour like bagels, donuts, rolls, buns, biscuits, cakes, pastries, cookies, pizza crust, and pasta (made from white flour & egg whites) - vegetarian pasta or spinach or wheat pasta is OK. Multigrain breads like Arnold's or [employer], or multigrain sandwich thins or flatbreads.  Diet, exercise and weight loss can reverse and cure diabetes in the early stages.  Diet, exercise and weight  loss is very important in the control and prevention of complications of diabetes which affects every system in your body, ie. Brain - dementia/stroke, eyes - glaucoma/blindness, heart - heart attack/heart failure, kidneys - dialysis, stomach - gastric paralysis, intestines - malabsorption, nerves - severe painful neuritis, circulation - gangrene & loss of a leg(s), and finally cancer and Alzheimers.    I recommend avoid fried & greasy foods,  sweets/candy, white rice (brown or wild rice or Quinoa is OK), white potatoes (sweet potatoes are OK) - anything made from white flour - bagels, doughnuts, rolls, buns, biscuits,white and wheat breads, pizza crust and traditional pasta made of white flour & egg white(vegetarian pasta or spinach or wheat pasta is OK).  Multi-grain bread is OK - like multi-grain flat bread or sandwich thins. Avoid alcohol in excess. Exercise is also important.    Eat all the vegetables you want - avoid meat, especially red meat and dairy - especially cheese.  Cheese is the most concentrated form of trans-fats which is the worst thing to clog up our arteries. Veggie cheese is OK which can be found in the fresh produce section at [employer] or [employer] or [employer]  ++++++++++++++++++++++++++  Preventive Care for Adults  A healthy lifestyle and preventive care can promote health and wellness. Preventive health guidelines for women include the following key practices.  A routine yearly physical is a good way to check with your health care   provider about your health and preventive screening. It is a chance to share any concerns and updates on your health and to receive a thorough exam.  Visit your dentist for a routine exam and preventive care every 6 months. Brush your teeth twice a day and floss once a day. Good oral hygiene prevents tooth decay and gum disease.  The frequency of eye exams is based on your age, health, family medical history, use of contact lenses, and other  factors. Follow your health care provider's recommendations for frequency of eye exams.  Eat a healthy diet. Foods like vegetables, fruits, whole grains, low-fat dairy products, and lean protein foods contain the nutrients you need without too many calories. Decrease your intake of foods high in solid fats, added sugars, and salt. Eat the right amount of calories for you.Get information about a proper diet from your health care provider, if necessary.  Regular physical exercise is one of the most important things you can do for your health. Most adults should get at least 150 minutes of moderate-intensity exercise (any activity that increases your heart rate and causes you to sweat) each week. In addition, most adults need muscle-strengthening exercises on 2 or more days a week.  Maintain a healthy weight. The body mass index (BMI) is a screening tool to identify possible weight problems. It provides an estimate of body fat based on height and weight. Your health care provider can find your BMI and can help you achieve or maintain a healthy weight.For adults 20 years and older:  A BMI below 18.5 is considered underweight.  A BMI of 18.5 to 24.9 is normal.  A BMI of 25 to 29.9 is considered overweight.  A BMI of 30 and above is considered obese.  Maintain normal blood lipids and cholesterol levels by exercising and minimizing your intake of saturated fat. Eat a balanced diet with plenty of fruit and vegetables. Blood tests for lipids and cholesterol should begin at age 20 and be repeated every 5 years. If your lipid or cholesterol levels are high, you are over 50, or you are at high risk for heart disease, you may need your cholesterol levels checked more frequently.Ongoing high lipid and cholesterol levels should be treated with medicines if diet and exercise are not working.  If you smoke, find out from your health care provider how to quit. If you do not use tobacco, do not start.  Lung  cancer screening is recommended for adults aged 55-80 years who are at high risk for developing lung cancer because of a history of smoking. A yearly low-dose CT scan of the lungs is recommended for people who have at least a 30-pack-year history of smoking and are a current smoker or have quit within the past 15 years. A pack year of smoking is smoking an average of 1 pack of cigarettes a day for 1 year (for example: 1 pack a day for 30 years or 2 packs a day for 15 years). Yearly screening should continue until the smoker has stopped smoking for at least 15 years. Yearly screening should be stopped for people who develop a health problem that would prevent them from having lung cancer treatment.  High blood pressure causes heart disease and increases the risk of stroke. Your blood pressure should be checked at least every 1 to 2 years. Ongoing high blood pressure should be treated with medicines if weight loss and exercise do not work.  If you are 55-79 years old, ask your   health care provider if you should take aspirin to prevent strokes.  Diabetes screening involves taking a blood sample to check your fasting blood sugar level. This should be done once every 3 years, after age 45, if you are within normal weight and without risk factors for diabetes. Testing should be considered at a younger age or be carried out more frequently if you are overweight and have at least 1 risk factor for diabetes.  Breast cancer screening is essential preventive care for women. You should practice "breast self-awareness." This means understanding the normal appearance and feel of your breasts and may include breast self-examination. Any changes detected, no matter how small, should be reported to a health care provider. Women in their 20s and 30s should have a clinical breast exam (CBE) by a health care provider as part of a regular health exam every 1 to 3 years. After age 40, women should have a CBE every year. Starting  at age 40, women should consider having a mammogram (breast X-ray test) every year. Women who have a family history of breast cancer should talk to their health care provider about genetic screening. Women at a high risk of breast cancer should talk to their health care providers about having an MRI and a mammogram every year.  Breast cancer gene (BRCA)-related cancer risk assessment is recommended for women who have family members with BRCA-related cancers. BRCA-related cancers include breast, ovarian, tubal, and peritoneal cancers. Having family members with these cancers may be associated with an increased risk for harmful changes (mutations) in the breast cancer genes BRCA1 and BRCA2. Results of the assessment will determine the need for genetic counseling and BRCA1 and BRCA2 testing.  Routine pelvic exams to screen for cancer are no longer recommended for nonpregnant women who are considered low risk for cancer of the pelvic organs (ovaries, uterus, and vagina) and who do not have symptoms. Ask your health care provider if a screening pelvic exam is right for you.  If you have had past treatment for cervical cancer or a condition that could lead to cancer, you need Pap tests and screening for cancer for at least 20 years after your treatment. If Pap tests have been discontinued, your risk factors (such as having a new sexual partner) need to be reassessed to determine if screening should be resumed. Some women have medical problems that increase the chance of getting cervical cancer. In these cases, your health care provider may recommend more frequent screening and Pap tests.  Colorectal cancer can be detected and often prevented. Most routine colorectal cancer screening begins at the age of 50 years and continues through age 75 years. However, your health care provider may recommend screening at an earlier age if you have risk factors for colon cancer. On a yearly basis, your health care provider may  provide home test kits to check for hidden blood in the stool. Use of a small camera at the end of a tube, to directly examine the colon (sigmoidoscopy or colonoscopy), can detect the earliest forms of colorectal cancer. Talk to your health care provider about this at age 50, when routine screening begins. Direct exam of the colon should be repeated every 5-10 years through age 75 years, unless early forms of pre-cancerous polyps or small growths are found.  Hepatitis C blood testing is recommended for all people born from 1945 through 1965 and any individual with known risks for hepatitis C.  Pra  Osteoporosis is a disease in which the   bones lose minerals and strength with aging. This can result in serious bone fractures or breaks. The risk of osteoporosis can be identified using a bone density scan. Women ages 65 years and over and women at risk for fractures or osteoporosis should discuss screening with their health care providers. Ask your health care provider whether you should take a calcium supplement or vitamin D to reduce the rate of osteoporosis.  Menopause can be associated with physical symptoms and risks. Hormone replacement therapy is available to decrease symptoms and risks. You should talk to your health care provider about whether hormone replacement therapy is right for you.  Use sunscreen. Apply sunscreen liberally and repeatedly throughout the day. You should seek shade when your shadow is shorter than you. Protect yourself by wearing long sleeves, pants, a wide-brimmed hat, and sunglasses year round, whenever you are outdoors.  Once a month, do a whole body skin exam, using a mirror to look at the skin on your back. Tell your health care provider of new moles, moles that have irregular borders, moles that are larger than a pencil eraser, or moles that have changed in shape or color.  Stay current with required vaccines (immunizations).  Influenza vaccine. All adults should be  immunized every year.  Tetanus, diphtheria, and acellular pertussis (Td, Tdap) vaccine. Pregnant women should receive 1 dose of Tdap vaccine during each pregnancy. The dose should be obtained regardless of the length of time since the last dose. Immunization is preferred during the 27th-36th week of gestation. An adult who has not previously received Tdap or who does not know her vaccine status should receive 1 dose of Tdap. This initial dose should be followed by tetanus and diphtheria toxoids (Td) booster doses every 10 years. Adults with an unknown or incomplete history of completing a 3-dose immunization series with Td-containing vaccines should begin or complete a primary immunization series including a Tdap dose. Adults should receive a Td booster every 10 years.  Varicella vaccine. An adult without evidence of immunity to varicella should receive 2 doses or a second dose if she has previously received 1 dose. Pregnant females who do not have evidence of immunity should receive the first dose after pregnancy. This first dose should be obtained before leaving the health care facility. The second dose should be obtained 4-8 weeks after the first dose.  Human papillomavirus (HPV) vaccine. Females aged 13-26 years who have not received the vaccine previously should obtain the 3-dose series. The vaccine is not recommended for use in pregnant females. However, pregnancy testing is not needed before receiving a dose. If a female is found to be pregnant after receiving a dose, no treatment is needed. In that case, the remaining doses should be delayed until after the pregnancy. Immunization is recommended for any person with an immunocompromised condition through the age of 26 years if she did not get any or all doses earlier. During the 3-dose series, the second dose should be obtained 4-8 weeks after the first dose. The third dose should be obtained 24 weeks after the first dose and 16 weeks after the second  dose.  Zoster vaccine. One dose is recommended for adults aged 60 years or older unless certain conditions are present.  Measles, mumps, and rubella (MMR) vaccine. Adults born before 1957 generally are considered immune to measles and mumps. Adults born in 1957 or later should have 1 or more doses of MMR vaccine unless there is a contraindication to the vaccine or there is   laboratory evidence of immunity to each of the three diseases. A routine second dose of MMR vaccine should be obtained at least 28 days after the first dose for students attending postsecondary schools, health care workers, or international travelers. People who received inactivated measles vaccine or an unknown type of measles vaccine during 1963-1967 should receive 2 doses of MMR vaccine. People who received inactivated mumps vaccine or an unknown type of mumps vaccine before 1979 and are at high risk for mumps infection should consider immunization with 2 doses of MMR vaccine. For females of childbearing age, rubella immunity should be determined. If there is no evidence of immunity, females who are not pregnant should be vaccinated. If there is no evidence of immunity, females who are pregnant should delay immunization until after pregnancy. Unvaccinated health care workers born before 1957 who lack laboratory evidence of measles, mumps, or rubella immunity or laboratory confirmation of disease should consider measles and mumps immunization with 2 doses of MMR vaccine or rubella immunization with 1 dose of MMR vaccine.  Pneumococcal 13-valent conjugate (PCV13) vaccine. When indicated, a person who is uncertain of her immunization history and has no record of immunization should receive the PCV13 vaccine. An adult aged 19 years or older who has certain medical conditions and has not been previously immunized should receive 1 dose of PCV13 vaccine. This PCV13 should be followed with a dose of pneumococcal polysaccharide (PPSV23) vaccine.  The PPSV23 vaccine dose should be obtained at least 8 weeks after the dose of PCV13 vaccine. An adult aged 19 years or older who has certain medical conditions and previously received 1 or more doses of PPSV23 vaccine should receive 1 dose of PCV13. The PCV13 vaccine dose should be obtained 1 or more years after the last PPSV23 vaccine dose.    Pneumococcal polysaccharide (PPSV23) vaccine. When PCV13 is also indicated, PCV13 should be obtained first. All adults aged 65 years and older should be immunized. An adult younger than age 65 years who has certain medical conditions should be immunized. Any person who resides in a nursing home or long-term care facility should be immunized. An adult smoker should be immunized. People with an immunocompromised condition and certain other conditions should receive both PCV13 and PPSV23 vaccines. People with human immunodeficiency virus (HIV) infection should be immunized as soon as possible after diagnosis. Immunization during chemotherapy or radiation therapy should be avoided. Routine use of PPSV23 vaccine is not recommended for American Indians, [place] Natives, or people younger than 65 years unless there are medical conditions that require PPSV23 vaccine. When indicated, people who have unknown immunization and have no record of immunization should receive PPSV23 vaccine. One-time revaccination 5 years after the first dose of PPSV23 is recommended for people aged 19-64 years who have chronic kidney failure, nephrotic syndrome, asplenia, or immunocompromised conditions. People who received 1-2 doses of PPSV23 before age 65 years should receive another dose of PPSV23 vaccine at age 65 years or later if at least 5 years have passed since the previous dose. Doses of PPSV23 are not needed for people immunized with PPSV23 at or after age 65 years.  Preventive Services / Frequency   Ages 40 to 64 years  Blood pressure check.  Lipid and cholesterol check.  Lung  cancer screening. / Every year if you are aged 55-80 years and have a 30-pack-year history of smoking and currently smoke or have quit within the past 15 years. Yearly screening is stopped once you have quit smoking for at   least 15 years or develop a health problem that would prevent you from having lung cancer treatment.  Clinical breast exam.** / Every year after age 40 years.  BRCA-related cancer risk assessment.** / For women who have family members with a BRCA-related cancer (breast, ovarian, tubal, or peritoneal cancers).  Mammogram.** / Every year beginning at age 40 years and continuing for as long as you are in good health. Consult with your health care provider.  Pap test.** / Every 3 years starting at age 30 years through age 65 or 70 years with a history of 3 consecutive normal Pap tests.  HPV screening.** / Every 3 years from ages 30 years through ages 65 to 70 years with a history of 3 consecutive normal Pap tests.  Fecal occult blood test (FOBT) of stool. / Every year beginning at age 50 years and continuing until age 75 years. You may not need to do this test if you get a colonoscopy every 10 years.  Flexible sigmoidoscopy or colonoscopy.** / Every 5 years for a flexible sigmoidoscopy or every 10 years for a colonoscopy beginning at age 50 years and continuing until age 75 years.  Hepatitis C blood test.** / For all people born from 1945 through 1965 and any individual with known risks for hepatitis C.  Skin self-exam. / Monthly.  Influenza vaccine. / Every year.  Tetanus, diphtheria, and acellular pertussis (Tdap/Td) vaccine.** / Consult your health care provider. Pregnant women should receive 1 dose of Tdap vaccine during each pregnancy. 1 dose of Td every 10 years.  Varicella vaccine.** / Consult your health care provider. Pregnant females who do not have evidence of immunity should receive the first dose after pregnancy.  Zoster vaccine.** / 1 dose for adults aged 60  years or older.  Pneumococcal 13-valent conjugate (PCV13) vaccine.** / Consult your health care provider.  Pneumococcal polysaccharide (PPSV23) vaccine.** / 1 to 2 doses if you smoke cigarettes or if you have certain conditions.  Meningococcal vaccine.** / Consult your health care provider.  Hepatitis A vaccine.** / Consult your health care provider.  Hepatitis B vaccine.** / Consult your health care provider. Screening for abdominal aortic aneurysm (AAA)  by ultrasound is recommended for people over 50 who have history of high blood pressure or who are current or former smokers. 

## 2015-03-30 LAB — URINALYSIS, MICROSCOPIC ONLY
Bacteria, UA: NONE SEEN
Casts: NONE SEEN
Crystals: NONE SEEN
Squamous Epithelial / LPF: NONE SEEN

## 2015-03-30 LAB — MICROALBUMIN / CREATININE URINE RATIO
Creatinine, Urine: 70.9 mg/dL
Microalb Creat Ratio: 754.6 mg/g — ABNORMAL HIGH (ref 0.0–30.0)
Microalb, Ur: 53.5 mg/dL — ABNORMAL HIGH (ref <2.0)

## 2015-03-30 LAB — VITAMIN D 25 HYDROXY (VIT D DEFICIENCY, FRACTURES): Vit D, 25-Hydroxy: 42 ng/mL (ref 30–100)

## 2015-03-31 LAB — TB SKIN TEST
Induration: 0 mm
TB Skin Test: NEGATIVE

## 2015-04-28 NOTE — Progress Notes (Signed)
HPI: FU hypertension. Myoview in Oct 2012 showed EF 49, no ischemia, LVE. Renal dopplers Nov 2013 showed no RAS. Recently admitted with CHF after missing Lasix; cardiac MRI showed ejection fraction 61%, moderate left ventricular hypertrophy, large pericardial effusion and mildly reduced RV function. Last echocardiogram in January 2016 showed normal LV function, grade 2 diastolic dysfunction, mildly reduced RV function. Moderate pericardial effusion. Pericardial effusion felt possibly related to minoxidil which was stopped. Since I last saw her, she has dyspnea with more extreme activities but not routine activities. No orthopnea, PND, pedal edema, syncope or exertional chest pain.  Current Outpatient Prescriptions  Medication Sig Dispense Refill  . aspirin 81 MG tablet Take 81 mg by mouth daily.      . canagliflozin (INVOKANA) 100 MG TABS tablet Take 1 tablet (100 mg total) by mouth daily. 90 tablet 1  . Cholecalciferol (VITAMIN D-3) 5000 UNITS TABS Take 1 tablet by mouth daily.    . cloNIDine (CATAPRES - DOSED IN MG/24 HR) 0.2 mg/24hr patch Place 1 patch (0.2 mg total) onto the skin every 7 (seven) days. 4 patch 2  . cyclobenzaprine (FLEXERIL) 10 MG tablet Take 1 tablet (10 mg total) by mouth 2 (two) times daily as needed for muscle spasms. 20 tablet 0  . furosemide (LASIX) 40 MG tablet Take 1 tablet (40 mg total) by mouth daily. 60 tablet 0  . glucose blood test strip Freestyle lite test strips, check sugar 3 times daily. DX 250.41 100 each 12  . hydrALAZINE (APRESOLINE) 100 MG tablet Take 1 tablet (100 mg total) by mouth every 8 (eight) hours. 90 tablet 2  . Insulin Aspart Prot & Aspart (NOVOLOG MIX 70/30 FLEXPEN) (70-30) 100 UNIT/ML Pen Inject 20 Units into the skin 2 (two) times daily. 15 mL 11  . labetalol (NORMODYNE) 300 MG tablet Take 1 tablet (300 mg total) by mouth 2 (two) times daily. 180 tablet 3  . Lancets MISC Use as directed    . losartan (COZAAR) 50 MG tablet Take 1 tablet  (50 mg total) by mouth 2 (two) times daily. 180 tablet 0  . Magnesium 250 MG TABS Take 1 tablet by mouth daily.    . metFORMIN (GLUCOPHAGE XR) 500 MG 24 hr tablet Take 1 to 2 tablets 2 x daily with meal as directed for Diabetes 360 tablet 99  . naproxen sodium (ANAPROX) 220 MG tablet Take 220 mg by mouth as needed.    . potassium chloride SA (K-DUR,KLOR-CON) 20 MEQ tablet Take 1 tablet (20 mEq total) by mouth daily. 90 tablet 2  . rosuvastatin (CRESTOR) 20 MG tablet Take 1 tablet (20 mg total) by mouth daily. 90 tablet 1   No current facility-administered medications for this visit.     Past Medical History  Diagnosis Date  . Obesity   . Elevated cholesterol   . Vitamin D deficiency   . DM (diabetes mellitus)   . Hypertension   . CVA (cerebral infarction) 1997    no residual deficit  . Anemia   . Renal insufficiency   . Vitamin D deficiency   . Diverticulitis   . Stroke 1997    Past Surgical History  Procedure Laterality Date  . Cesarean section      x 2    History   Social History  . Marital Status: Married    Spouse Name: N/A  . Number of Children: 3  . Years of Education: N/A   Occupational History  .  Unemployed; used to work as a Radio broadcast assistant   Social History Main Topics  . Smoking status: Never Smoker   . Smokeless tobacco: Never Used  . Alcohol Use: No  . Drug Use: No  . Sexual Activity: Yes    Birth Control/ Protection: None   Other Topics Concern  . Not on file   Social History Narrative    ROS: no fevers or chills, productive cough, hemoptysis, dysphasia, odynophagia, melena, hematochezia, dysuria, hematuria, rash, seizure activity, orthopnea, PND, pedal edema, claudication. Remaining systems are negative.  Physical Exam: Well-developed well-nourished in no acute distress.  Skin is warm and dry.  HEENT is normal.  Neck is supple.  Chest is clear to auscultation with normal expansion.  Cardiovascular exam is regular rate and  rhythm.  Abdominal exam nontender or distended. No masses palpated. Extremities show no edema. neuro grossly intact

## 2015-04-30 ENCOUNTER — Encounter: Payer: Self-pay | Admitting: Cardiology

## 2015-04-30 ENCOUNTER — Ambulatory Visit (INDEPENDENT_AMBULATORY_CARE_PROVIDER_SITE_OTHER): Payer: BLUE CROSS/BLUE SHIELD | Admitting: Cardiology

## 2015-04-30 ENCOUNTER — Encounter: Payer: Self-pay | Admitting: *Deleted

## 2015-04-30 VITALS — BP 144/70 | HR 70 | Ht 59.5 in | Wt 191.3 lb

## 2015-04-30 DIAGNOSIS — I319 Disease of pericardium, unspecified: Secondary | ICD-10-CM | POA: Diagnosis not present

## 2015-04-30 DIAGNOSIS — I5033 Acute on chronic diastolic (congestive) heart failure: Secondary | ICD-10-CM

## 2015-04-30 DIAGNOSIS — I313 Pericardial effusion (noninflammatory): Secondary | ICD-10-CM | POA: Insufficient documentation

## 2015-04-30 DIAGNOSIS — I3139 Other pericardial effusion (noninflammatory): Secondary | ICD-10-CM | POA: Insufficient documentation

## 2015-04-30 NOTE — Assessment & Plan Note (Signed)
Continue statin. 

## 2015-04-30 NOTE — Assessment & Plan Note (Signed)
euvolemic on examination.continue present dose of Lasix.

## 2015-04-30 NOTE — Assessment & Plan Note (Signed)
Patient's blood pressure has improved. She follows this closely at home and her systolic typically runs between 135 and 140. We will continue present medications and monitor.

## 2015-04-30 NOTE — Patient Instructions (Signed)
Your physician wants you to follow-up in: 6 MONTHS WITH DR CRENSHAW You will receive a reminder letter in the mail two months in advance. If you don't receive a letter, please call our office to schedule the follow-up appointment.   Your physician has requested that you have an echocardiogram. Echocardiography is a painless test that uses sound waves to create images of your heart. It provides your doctor with information about the size and shape of your heart and how well your heart's chambers and valves are working. This procedure takes approximately one hour. There are no restrictions for this procedure.   

## 2015-04-30 NOTE — Assessment & Plan Note (Signed)
Previously felt secondary to minoxidil. This was discontinued.repeat echocardiogram to reassess.

## 2015-05-06 ENCOUNTER — Ambulatory Visit (HOSPITAL_COMMUNITY)
Admission: RE | Admit: 2015-05-06 | Discharge: 2015-05-06 | Disposition: A | Discharge status: Home / Self Care | Payer: BLUE CROSS/BLUE SHIELD | Source: Ambulatory Visit | Attending: Cardiovascular Disease | Admitting: Cardiovascular Disease

## 2015-05-06 DIAGNOSIS — I340 Nonrheumatic mitral (valve) insufficiency: Secondary | ICD-10-CM | POA: Insufficient documentation

## 2015-05-06 DIAGNOSIS — I3139 Other pericardial effusion (noninflammatory): Secondary | ICD-10-CM

## 2015-05-06 DIAGNOSIS — I313 Pericardial effusion (noninflammatory): Secondary | ICD-10-CM

## 2015-05-06 DIAGNOSIS — I319 Disease of pericardium, unspecified: Secondary | ICD-10-CM | POA: Diagnosis not present

## 2015-05-07 ENCOUNTER — Other Ambulatory Visit: Payer: Self-pay | Admitting: Internal Medicine

## 2015-05-07 DIAGNOSIS — Z1231 Encounter for screening mammogram for malignant neoplasm of breast: Secondary | ICD-10-CM

## 2015-05-13 ENCOUNTER — Encounter: Payer: Self-pay | Admitting: Cardiology

## 2015-05-13 NOTE — Telephone Encounter (Signed)
This encounter was created in error - please disregard.

## 2015-05-13 NOTE — Telephone Encounter (Signed)
Pt says that she is returning Debra's phone call. She thinks that it may be in regards to the Echo she had done on 6/9. Please return her call  Thanks

## 2015-05-25 ENCOUNTER — Ambulatory Visit (HOSPITAL_COMMUNITY)
Admission: RE | Admit: 2015-05-25 | Discharge: 2015-05-25 | Disposition: A | Discharge status: Home / Self Care | Payer: BLUE CROSS/BLUE SHIELD | Source: Ambulatory Visit | Attending: Internal Medicine | Admitting: Internal Medicine

## 2015-05-25 DIAGNOSIS — Z1231 Encounter for screening mammogram for malignant neoplasm of breast: Secondary | ICD-10-CM | POA: Insufficient documentation

## 2015-05-27 ENCOUNTER — Encounter (HOSPITAL_COMMUNITY): Payer: Self-pay | Admitting: *Deleted

## 2015-05-27 ENCOUNTER — Inpatient Hospital Stay (HOSPITAL_COMMUNITY)
Admission: EM | Admit: 2015-05-27 | Discharge: 2015-05-31 | DRG: 292 | Disposition: A | Discharge status: Home / Self Care | Payer: BLUE CROSS/BLUE SHIELD | Attending: Cardiology | Admitting: Cardiology

## 2015-05-27 ENCOUNTER — Emergency Department (HOSPITAL_COMMUNITY): Payer: BLUE CROSS/BLUE SHIELD

## 2015-05-27 DIAGNOSIS — Z7982 Long term (current) use of aspirin: Secondary | ICD-10-CM

## 2015-05-27 DIAGNOSIS — Z8673 Personal history of transient ischemic attack (TIA), and cerebral infarction without residual deficits: Secondary | ICD-10-CM

## 2015-05-27 DIAGNOSIS — R0902 Hypoxemia: Secondary | ICD-10-CM | POA: Diagnosis present

## 2015-05-27 DIAGNOSIS — E559 Vitamin D deficiency, unspecified: Secondary | ICD-10-CM | POA: Diagnosis present

## 2015-05-27 DIAGNOSIS — Z6837 Body mass index (BMI) 37.0-37.9, adult: Secondary | ICD-10-CM

## 2015-05-27 DIAGNOSIS — I313 Pericardial effusion (noninflammatory): Secondary | ICD-10-CM | POA: Diagnosis present

## 2015-05-27 DIAGNOSIS — N184 Chronic kidney disease, stage 4 (severe): Secondary | ICD-10-CM | POA: Diagnosis present

## 2015-05-27 DIAGNOSIS — I5033 Acute on chronic diastolic (congestive) heart failure: Secondary | ICD-10-CM

## 2015-05-27 DIAGNOSIS — I129 Hypertensive chronic kidney disease with stage 1 through stage 4 chronic kidney disease, or unspecified chronic kidney disease: Secondary | ICD-10-CM | POA: Diagnosis present

## 2015-05-27 DIAGNOSIS — J9811 Atelectasis: Secondary | ICD-10-CM | POA: Diagnosis present

## 2015-05-27 DIAGNOSIS — E1169 Type 2 diabetes mellitus with other specified complication: Secondary | ICD-10-CM | POA: Diagnosis present

## 2015-05-27 DIAGNOSIS — R0602 Shortness of breath: Secondary | ICD-10-CM | POA: Diagnosis not present

## 2015-05-27 DIAGNOSIS — Z5301 Procedure and treatment not carried out due to patient smoking: Secondary | ICD-10-CM

## 2015-05-27 DIAGNOSIS — N189 Chronic kidney disease, unspecified: Secondary | ICD-10-CM

## 2015-05-27 DIAGNOSIS — E785 Hyperlipidemia, unspecified: Secondary | ICD-10-CM | POA: Diagnosis present

## 2015-05-27 DIAGNOSIS — E1122 Type 2 diabetes mellitus with diabetic chronic kidney disease: Secondary | ICD-10-CM | POA: Diagnosis present

## 2015-05-27 DIAGNOSIS — Z791 Long term (current) use of non-steroidal anti-inflammatories (NSAID): Secondary | ICD-10-CM

## 2015-05-27 DIAGNOSIS — R0600 Dyspnea, unspecified: Secondary | ICD-10-CM

## 2015-05-27 DIAGNOSIS — E669 Obesity, unspecified: Secondary | ICD-10-CM | POA: Diagnosis present

## 2015-05-27 DIAGNOSIS — I10 Essential (primary) hypertension: Secondary | ICD-10-CM | POA: Diagnosis present

## 2015-05-27 DIAGNOSIS — Z794 Long term (current) use of insulin: Secondary | ICD-10-CM

## 2015-05-27 DIAGNOSIS — I3139 Other pericardial effusion (noninflammatory): Secondary | ICD-10-CM

## 2015-05-27 DIAGNOSIS — Z888 Allergy status to other drugs, medicaments and biological substances status: Secondary | ICD-10-CM

## 2015-05-27 DIAGNOSIS — I272 Other secondary pulmonary hypertension: Secondary | ICD-10-CM | POA: Diagnosis present

## 2015-05-27 DIAGNOSIS — D631 Anemia in chronic kidney disease: Secondary | ICD-10-CM | POA: Diagnosis present

## 2015-05-27 DIAGNOSIS — I340 Nonrheumatic mitral (valve) insufficiency: Secondary | ICD-10-CM

## 2015-05-27 DIAGNOSIS — Z79899 Other long term (current) drug therapy: Secondary | ICD-10-CM

## 2015-05-27 DIAGNOSIS — Z8249 Family history of ischemic heart disease and other diseases of the circulatory system: Secondary | ICD-10-CM

## 2015-05-27 DIAGNOSIS — Z823 Family history of stroke: Secondary | ICD-10-CM

## 2015-05-27 HISTORY — DX: Other pericardial effusion (noninflammatory): I31.39

## 2015-05-27 HISTORY — DX: Chronic diastolic (congestive) heart failure: I50.32

## 2015-05-27 HISTORY — DX: Hyperlipidemia, unspecified: E78.5

## 2015-05-27 HISTORY — DX: Personal history of other medical treatment: Z92.89

## 2015-05-27 HISTORY — DX: Chronic kidney disease, stage 4 (severe): N18.4

## 2015-05-27 HISTORY — DX: Pericardial effusion (noninflammatory): I31.3

## 2015-05-27 LAB — CBC
HCT: 31.7 % — ABNORMAL LOW (ref 36.0–46.0)
HCT: 32 % — ABNORMAL LOW (ref 36.0–46.0)
Hemoglobin: 10.3 g/dL — ABNORMAL LOW (ref 12.0–15.0)
Hemoglobin: 10.3 g/dL — ABNORMAL LOW (ref 12.0–15.0)
MCH: 25.9 pg — ABNORMAL LOW (ref 26.0–34.0)
MCH: 25.7 pg — ABNORMAL LOW (ref 26.0–34.0)
MCHC: 32.5 g/dL (ref 30.0–36.0)
MCHC: 32.2 g/dL (ref 30.0–36.0)
MCV: 79.6 fL (ref 78.0–100.0)
MCV: 79.8 fL (ref 78.0–100.0)
Platelets: 251 10*3/uL (ref 150–400)
Platelets: 244 10*3/uL (ref 150–400)
RBC: 3.98 MIL/uL (ref 3.87–5.11)
RBC: 4.01 MIL/uL (ref 3.87–5.11)
RDW: 15.1 % (ref 11.5–15.5)
RDW: 15.1 % (ref 11.5–15.5)
WBC: 5.3 10*3/uL (ref 4.0–10.5)
WBC: 6 10*3/uL (ref 4.0–10.5)

## 2015-05-27 LAB — COMPREHENSIVE METABOLIC PANEL
ALT: 13 U/L — ABNORMAL LOW (ref 14–54)
AST: 18 U/L (ref 15–41)
Albumin: 3.7 g/dL (ref 3.5–5.0)
Alkaline Phosphatase: 51 U/L (ref 38–126)
Anion gap: 8 (ref 5–15)
BUN: 31 mg/dL — ABNORMAL HIGH (ref 6–20)
CO2: 28 mmol/L (ref 22–32)
Calcium: 9.4 mg/dL (ref 8.9–10.3)
Chloride: 101 mmol/L (ref 101–111)
Creatinine, Ser: 1.79 mg/dL — ABNORMAL HIGH (ref 0.44–1.00)
GFR calc Af Amer: 34 mL/min — ABNORMAL LOW (ref >60)
GFR calc non Af Amer: 30 mL/min — ABNORMAL LOW (ref >60)
Glucose, Bld: 138 mg/dL — ABNORMAL HIGH (ref 65–99)
Potassium: 3.2 mmol/L — ABNORMAL LOW (ref 3.5–5.1)
Sodium: 137 mmol/L (ref 135–145)
Total Bilirubin: 1.2 mg/dL (ref 0.3–1.2)
Total Protein: 6.8 g/dL (ref 6.5–8.1)

## 2015-05-27 LAB — D-DIMER, QUANTITATIVE: D-Dimer, Quant: 2.61 ug/mL-FEU — ABNORMAL HIGH (ref 0.00–0.48)

## 2015-05-27 LAB — CREATININE, SERUM
Creatinine, Ser: 1.88 mg/dL — ABNORMAL HIGH (ref 0.44–1.00)
GFR calc Af Amer: 32 mL/min — ABNORMAL LOW (ref >60)
GFR calc non Af Amer: 28 mL/min — ABNORMAL LOW (ref >60)

## 2015-05-27 LAB — GLUCOSE, CAPILLARY
Glucose-Capillary: 212 mg/dL — ABNORMAL HIGH (ref 65–99)
Glucose-Capillary: 162 mg/dL — ABNORMAL HIGH (ref 65–99)

## 2015-05-27 LAB — BRAIN NATRIURETIC PEPTIDE: B Natriuretic Peptide: 205.6 pg/mL — ABNORMAL HIGH (ref 0.0–100.0)

## 2015-05-27 LAB — I-STAT TROPONIN, ED: Troponin i, poc: 0.05 ng/mL (ref 0.00–0.08)

## 2015-05-27 LAB — SEDIMENTATION RATE: Sed Rate: 47 mm/hr — ABNORMAL HIGH (ref 0–22)

## 2015-05-27 LAB — MAGNESIUM: Magnesium: 2.1 mg/dL (ref 1.7–2.4)

## 2015-05-27 MED ORDER — ASPIRIN 81 MG PO TABS: 81.0000 mg | ORAL_TABLET | Freq: Every day | ORAL | Status: DC

## 2015-05-27 MED ORDER — ROSUVASTATIN CALCIUM 20 MG PO TABS
20.0000 mg | ORAL_TABLET | Freq: Every day | ORAL | Status: DC
  Administered 2015-05-28 – 2015-05-30 (×3): 20 mg via ORAL
  Filled 2015-05-27 (×4): qty 1

## 2015-05-27 MED ORDER — LOSARTAN POTASSIUM 50 MG PO TABS
50.0000 mg | ORAL_TABLET | Freq: Two times a day (BID) | ORAL | Status: DC
  Administered 2015-05-27 – 2015-05-31 (×8): 50 mg via ORAL
  Filled 2015-05-27 (×9): qty 1

## 2015-05-27 MED ORDER — SODIUM CHLORIDE 0.9 % IV SOLN
250.0000 mL | INTRAVENOUS | Status: DC | PRN
Start: 2015-05-27 — End: 2015-05-31

## 2015-05-27 MED ORDER — POTASSIUM CHLORIDE CRYS ER 20 MEQ PO TBCR
20.0000 meq | EXTENDED_RELEASE_TABLET | Freq: Every day | ORAL | Status: DC
  Administered 2015-05-28 – 2015-05-31 (×4): 20 meq via ORAL
  Filled 2015-05-27 (×4): qty 1

## 2015-05-27 MED ORDER — ONDANSETRON HCL 4 MG/2ML IJ SOLN: 4.0000 mg | Freq: Four times a day (QID) | INTRAMUSCULAR | Status: DC | PRN

## 2015-05-27 MED ORDER — ASPIRIN EC 81 MG PO TBEC
81.0000 mg | DELAYED_RELEASE_TABLET | Freq: Every day | ORAL | Status: DC
  Administered 2015-05-27 – 2015-05-31 (×5): 81 mg via ORAL
  Filled 2015-05-27 (×5): qty 1

## 2015-05-27 MED ORDER — FUROSEMIDE 10 MG/ML IJ SOLN
40.0000 mg | Freq: Two times a day (BID) | INTRAMUSCULAR | Status: DC
  Administered 2015-05-27 – 2015-05-31 (×8): 40 mg via INTRAVENOUS
  Filled 2015-05-27 (×13): qty 4

## 2015-05-27 MED ORDER — SODIUM CHLORIDE 0.9 % IJ SOLN: 3.0000 mL | INTRAMUSCULAR | Status: DC | PRN

## 2015-05-27 MED ORDER — CYCLOBENZAPRINE HCL 10 MG PO TABS: 10.0000 mg | ORAL_TABLET | Freq: Two times a day (BID) | ORAL | Status: DC | PRN

## 2015-05-27 MED ORDER — LABETALOL HCL 300 MG PO TABS
300.0000 mg | ORAL_TABLET | Freq: Two times a day (BID) | ORAL | Status: DC
  Administered 2015-05-27 – 2015-05-31 (×8): 300 mg via ORAL
  Filled 2015-05-27 (×9): qty 1

## 2015-05-27 MED ORDER — VITAMIN D3 25 MCG (1000 UNIT) PO TABS
1000.0000 [IU] | ORAL_TABLET | Freq: Every day | ORAL | Status: DC
  Administered 2015-05-28 – 2015-05-31 (×4): 1000 [IU] via ORAL
  Filled 2015-05-27 (×4): qty 1

## 2015-05-27 MED ORDER — SODIUM CHLORIDE 0.9 % IJ SOLN
3.0000 mL | Freq: Two times a day (BID) | INTRAMUSCULAR | Status: DC
  Administered 2015-05-27 – 2015-05-31 (×7): 3 mL via INTRAVENOUS

## 2015-05-27 MED ORDER — CLONIDINE HCL 0.2 MG/24HR TD PTWK
0.2000 mg | MEDICATED_PATCH | TRANSDERMAL | Status: DC
  Administered 2015-05-27: 0.2 mg via TRANSDERMAL
  Filled 2015-05-27: qty 1

## 2015-05-27 MED ORDER — MAGNESIUM 250 MG PO TABS: 1.0000 | ORAL_TABLET | Freq: Every day | ORAL | Status: DC

## 2015-05-27 MED ORDER — INSULIN ASPART 100 UNIT/ML ~~LOC~~ SOLN
0.0000 [IU] | Freq: Three times a day (TID) | SUBCUTANEOUS | Status: DC
Start: 2015-05-27 — End: 2015-05-31
  Administered 2015-05-27: 5 [IU] via SUBCUTANEOUS
  Administered 2015-05-28 (×2): 2 [IU] via SUBCUTANEOUS
  Administered 2015-05-28 – 2015-05-29 (×2): 3 [IU] via SUBCUTANEOUS
  Administered 2015-05-29 – 2015-05-30 (×3): 2 [IU] via SUBCUTANEOUS
  Administered 2015-05-31: 3 [IU] via SUBCUTANEOUS

## 2015-05-27 MED ORDER — ACETAMINOPHEN 325 MG PO TABS: 650.0000 mg | ORAL_TABLET | ORAL | Status: DC | PRN

## 2015-05-27 MED ORDER — MAGNESIUM OXIDE 400 (241.3 MG) MG PO TABS
200.0000 mg | ORAL_TABLET | Freq: Every day | ORAL | Status: DC
  Administered 2015-05-28 – 2015-05-31 (×4): 200 mg via ORAL
  Filled 2015-05-27 (×4): qty 0.5

## 2015-05-27 MED ORDER — HYDRALAZINE HCL 50 MG PO TABS
100.0000 mg | ORAL_TABLET | Freq: Three times a day (TID) | ORAL | Status: DC
  Administered 2015-05-27 – 2015-05-31 (×10): 100 mg via ORAL
  Filled 2015-05-27 (×14): qty 2

## 2015-05-27 MED ORDER — HEPARIN SODIUM (PORCINE) 5000 UNIT/ML IJ SOLN
5000.0000 [IU] | Freq: Three times a day (TID) | INTRAMUSCULAR | Status: DC
  Administered 2015-05-27 – 2015-05-31 (×11): 5000 [IU] via SUBCUTANEOUS
  Filled 2015-05-27 (×14): qty 1

## 2015-05-27 MED ORDER — POTASSIUM CHLORIDE CRYS ER 20 MEQ PO TBCR
40.0000 meq | EXTENDED_RELEASE_TABLET | Freq: Once | ORAL | Status: AC
  Administered 2015-05-27: 40 meq via ORAL
  Filled 2015-05-27: qty 2

## 2015-05-27 NOTE — ED Notes (Addendum)
Pt with hx of chf to ED c/o dyspnea with exertion and lay supine.  Denies sob. Sats of 85% on RA.  C/o pain to L calf.

## 2015-05-27 NOTE — ED Notes (Signed)
Attempted report x1. 

## 2015-05-27 NOTE — H&P (Signed)
Patient ID: Jody Nguyen MRN: [id number], DOB/AGE: [birth year]   Admit date: 05/27/2015   Primary Physician: Alesia Richards, MD Primary Cardiologist: Dr. Stanford Breed  HPI:  Jody Nguyen is a 59 y.o. female with a history of malignant HTN, diastolic heart failure, stroke, DM, CKD4 , Hyperlipidemia, vita D deficiency and anemia who presented to ED with worsening SOB.   Myoview in Oct 2012 showed EF 49, no ischemia, LVE. Renal dopplers Nov 2013 showed no RAS. She was admitted Dec 2015 with CHF after missing Lasix; cardiac MRI showed ejection fraction 61%, moderate left ventricular hypertrophy, large pericardial effusion and mildly reduced RV function. Last echocardiogram in January 2016 showed normal LV function, grade 2 diastolic dysfunction, mildly reduced RV function. Moderate pericardial effusion. Pericardial effusion felt possibly related to minoxidil which was stopped.   She was complaining of dyspnea with more extreme activities but not routine activities when seen last by Dr. Stanford Breed 04/28/15. At that time her, current dose of lasix was resumed. Repeat echocardiogram 05/06/15 showed LV EF of 55-60%; mild mitral regurgitation; small pericardial effusion.   She was doing well until 2 day ago she started have sob again. This time more with laying down than progress to PND and now with minimal exertion. She denies any chest pain, palpitation, LE edema, cough, nausea or vomiting. She denies recently being sick. She is compliment with medication. No recent travel of surgery.   In ED her CXR showed subsegmental left lung base atelectasis and superimposed pneumonia is not excluded. K of 3.2. Creatinine of 1.79. BUN of 31. BNP of 205. POC trop of negative. EKG without acute abnormality.   Problem List  Past Medical History  Diagnosis Date  . Obesity   . Elevated cholesterol   . Vitamin D deficiency   . DM (diabetes mellitus)   . Hypertension   . CVA (cerebral infarction) 1997   no residual deficit  . Anemia   . Renal insufficiency   . Vitamin D deficiency   . Diverticulitis   . Stroke 1997  . CHF (congestive heart failure)     Past Surgical History  Procedure Laterality Date  . Cesarean section      x 2     Allergies  Allergies  Allergen Reactions  . Ace Inhibitors   . Lipitor [Atorvastatin]     Tolerates rosuvastatin  . Minoxidil     Pericardial effusion     Home Medications  Prior to Admission medications   Medication Sig Start Date End Date Taking? Authorizing Provider  aspirin 81 MG tablet Take 81 mg by mouth daily.      Historical Provider, MD  canagliflozin (INVOKANA) 100 MG TABS tablet Take 1 tablet (100 mg total) by mouth daily. 09/25/14   Vicie Mutters, PA-C  Cholecalciferol (VITAMIN D-3) 5000 UNITS TABS Take 1 tablet by mouth daily.    Historical Provider, MD  cloNIDine (CATAPRES - DOSED IN MG/24 HR) 0.2 mg/24hr patch Place 1 patch (0.2 mg total) onto the skin every 7 (seven) days. 03/22/15   Vicie Mutters, PA-C  cyclobenzaprine (FLEXERIL) 10 MG tablet Take 1 tablet (10 mg total) by mouth 2 (two) times daily as needed for muscle spasms. 11/15/14   Britt Bottom, NP  furosemide (LASIX) 40 MG tablet Take 1 tablet (40 mg total) by mouth daily. 01/07/15   Lelon Perla, MD  glucose blood test strip Freestyle lite test strips, check sugar 3 times daily. DX 250.41 08/04/14   Vicie Mutters, PA-C  hydrALAZINE (  APRESOLINE) 100 MG tablet Take 1 tablet (100 mg total) by mouth every 8 (eight) hours. 03/18/15   Unk Pinto, MD  Insulin Aspart Prot & Aspart (NOVOLOG MIX 70/30 FLEXPEN) (70-30) 100 UNIT/ML Pen Inject 20 Units into the skin 2 (two) times daily. 08/04/14   Vicie Mutters, PA-C  labetalol (NORMODYNE) 300 MG tablet Take 1 tablet (300 mg total) by mouth 2 (two) times daily. 12/28/14   Lelon Perla, MD  Lancets MISC Use as directed 02/20/13   Historical Provider, MD  losartan (COZAAR) 50 MG tablet Take 1 tablet (50 mg total) by mouth 2  (two) times daily. 12/30/14   Unk Pinto, MD  Magnesium 250 MG TABS Take 1 tablet by mouth daily.    Historical Provider, MD  metFORMIN (GLUCOPHAGE XR) 500 MG 24 hr tablet Take 1 to 2 tablets 2 x daily with meal as directed for Diabetes 03/29/15 03/28/16  Unk Pinto, MD  naproxen sodium (ANAPROX) 220 MG tablet Take 220 mg by mouth as needed.    Historical Provider, MD  potassium chloride SA (K-DUR,KLOR-CON) 20 MEQ tablet Take 1 tablet (20 mEq total) by mouth daily. 11/30/14   Vicie Mutters, PA-C  rosuvastatin (CRESTOR) 20 MG tablet Take 1 tablet (20 mg total) by mouth daily. 11/17/14   Unk Pinto, MD    Family History  Family History  Problem Relation Age of Onset  . Heart attack Brother     MI in his 91s  . Cancer Sister     breast cancer  . Breast cancer Sister   . Stroke Mother    Family Status  Relation Status Death Age  . Sister Deceased   . Mother Deceased   . Father Deceased      Social History  History   Social History  . Marital Status: Married    Spouse Name: N/A  . Number of Children: 3  . Years of Education: N/A   Occupational History  .      Unemployed; used to work as a Radio broadcast assistant   Social History Main Topics  . Smoking status: Never Smoker   . Smokeless tobacco: Never Used  . Alcohol Use: No  . Drug Use: No  . Sexual Activity: Yes    Birth Control/ Protection: None   Other Topics Concern  . Not on file   Social History Narrative     All other systems reviewed and are otherwise negative except as noted above.  Physical Exam  Blood pressure 141/91, pulse 84, temperature 98.1 F (36.7 C), temperature source Oral, resp. rate 18, height 4' 11.5" (1.511 m), weight 194 lb (87.998 kg), SpO2 99 %.  General: Pleasant,  NAD Psych: Normal affect. Neuro: Alert and oriented X 3. Moves all extremities spontaneously. HEENT: Normal  Neck: Supple without bruits or JVD. Lungs:  Resp regular and unlabored, CTA. Heart: RRR.  holosystolic murmurs that radiates to her back.  Abdomen: Soft, non-tender, non-distended, BS + x 4.  Extremities: No clubbing, cyanosis or edema. DP/PT/Radials 2+ and equal bilaterally. Puffy ankle.   Labs  No results for input(s): CKTOTAL, CKMB, TROPONINI in the last 72 hours. Lab Results  Component Value Date   WBC 5.3 05/27/2015   HGB 10.3* 05/27/2015   HCT 31.7* 05/27/2015   MCV 79.6 05/27/2015   PLT 251 05/27/2015    Recent Labs Lab 05/27/15 1455  NA 137  K 3.2*  CL 101  CO2 28  BUN 31*  CREATININE 1.79*  CALCIUM 9.4  PROT 6.8  BILITOT 1.2  ALKPHOS 51  ALT 13*  AST 18  GLUCOSE 138*   Lab Results  Component Value Date   CHOL 190 03/29/2015   HDL 67 03/29/2015   LDLCALC 104* 03/29/2015   TRIG 94 03/29/2015     Radiology/Studies  Dg Chest Port 1 View  05/27/2015   CLINICAL DATA:  59 year old female with shortness of breath x2 days  EXAM: PORTABLE CHEST - 1 VIEW  COMPARISON:  Chest radiograph dated 11/30/2014  FINDINGS: Single view of the chest demonstrate stable increased airspace density in the left lower lung field, likely atelectatic changes. Superimposed pneumonia is not excluded. No significant pleural effusion. No pneumothorax. Stable cardiomegaly.  IMPRESSION: Subsegmental left lung base atelectasis. No significant interval change.   Electronically Signed   By: Anner Crete M.D.   On: 05/27/2015 15:38   Mm Digital Screening Bilateral  05/25/2015   CLINICAL DATA:  Screening.  EXAM: DIGITAL SCREENING BILATERAL MAMMOGRAM WITH CAD  COMPARISON:  Previous exam(s).  ACR Breast Density Category c: The breast tissue is heterogeneously dense, which may obscure small masses.  FINDINGS: There are no findings suspicious for malignancy. Images were processed with CAD.  IMPRESSION: No mammographic evidence of malignancy. A result letter of this screening mammogram will be mailed directly to the patient.  RECOMMENDATION: Screening mammogram in one year. (Code:SM-B-01Y)   BI-RADS CATEGORY  1: Negative.   Electronically Signed   By: Conchita Paris M.D.   On: 05/25/2015 13:22    ECG  NSR at rate of 85. TWI in lead V6.   Echo 05/06/15 LV EF: 55% -  60%  ------------------------------------------------------------------- Indications:   Pericardial Effusion (I31.9).  ------------------------------------------------------------------- History:  PMH: History of Moderate Pericardial Effusion, Left Ventricular Hypertrophy. Congestive heart failure. Stroke. Risk factors: Hypertension. Diabetes mellitus. Dyslipidemia.  ------------------------------------------------------------------- Study Conclusions  - Left ventricle: The cavity size was normal. Wall thickness was normal. Systolic function was normal. The estimated ejection fraction was in the range of 55% to 60%. - Mitral valve: There was mild regurgitation. - Pericardium, extracardiac: Small pericardial effusion surrounds heart. Does not appear to be hemodynamically compromising. A small pericardial effusion was identified.   ASSESSMENT AND PLAN   1. Dyspnea - Started suddenly two days ago, progress to orthopnea, PND and now dyspnea at rest. CXR showed subsegmental left lung base atelectasis and superimposed pneumonia is not excluded. K of 3.2. Creatinine of 1.79. BUN of 31. BNP of 205. POC trop of negative. EKG without acute abnormality.  - Will get 2 view CXR to r/o pneumonia. Low possibility of PE, however will get Ddimer. Will give supplemental oxygen for comfort.  - Could be due to acute on chronic diastolic heart failure, will get repeat echo  2. Pericardial effusion  -she had small pericardial effusion on last echo, inititially felt possibly related to minoxidil which was stopped. Likely she is continues to have pericardial effusion, Will get ANA, sed rate for further workup. - Will get repeat echo  3. Acute on chronic diastolic heart failure - BNP 205.  - Will start on IV  lasix 40mg  BID.   4. DM - SSI  5. Acute on CKD, stage III-IV - Will hold ARB and metformin - Continue to monitor  6. HL - Continue statin   Signed, Bhagat,Bhavinkumar, PA-C 05/27/2015, 4:08 PM Pager [phone] Patient was seen in the emergency room with Mr. Curly [place], [place].  Agree with assessment and plan as noted above.  The patient was in her usual state  of health until about 2 days ago when she developed significant worsening of her dyspnea associated with orthopnea.  She denies any chest discomfort.  She has not had any pleuritic pain.  She has a past history of a chronic pericardial effusion of small size.  She has a history of chronic kidney disease stage III-IV.  She has not been aware of any significant peripheral edema.  She has not had any significant change in her diet.  She avoids sodium.  She has been compliant with her medications.  Her physical examination is notable for a loud grade 3/6 holosystolic murmur loudest at the apex and well heard in the midaxillary line and through to her back on the left side, suggestive of mitral regurgitation.  Her previous echoes have shown only mild mitral regurgitation.  We will repeat her echo.  It is possible that she has ruptured a cord leading to the new loud murmur and her symptoms of increased dyspnea and orthopnea.  I don't hear a pericardial rub although she has a known small pericardial effusion by previous studies.  We will continue her ARB for afterload reduction and continue to watch renal function closely.

## 2015-05-27 NOTE — ED Provider Notes (Signed)
CSN: [id number]     Arrival date & time 05/27/15  1430 History   First MD Initiated Contact with Patient 05/27/15 1502     Chief Complaint  Patient presents with  . Shortness of Breath     (Consider location/radiation/quality/duration/timing/severity/associated sxs/prior Treatment) Patient is a 59 y.o. female presenting with shortness of breath.  Shortness of Breath Severity:  Moderate Onset quality:  Gradual Duration:  2 days Timing:  Constant Progression:  Worsening Chronicity:  Recurrent Context comment:  H/o pericardial effusion, CHF Relieved by: at times walking, but not in the last few days. Exacerbated by: lying down and now walking. Ineffective treatments:  Diuretics, rest and position changes Associated symptoms: cough   Associated symptoms: no abdominal pain, no chest pain, no diaphoresis, no ear pain, no fever, no headaches, no PND, no rash, no sore throat, no sputum production, no vomiting and no wheezing   Risk factors: obesity   Risk factors: no family hx of DVT, no hx of cancer and no tobacco use     Past Medical History  Diagnosis Date  . Obesity   . Elevated cholesterol   . Vitamin D deficiency   . DM (diabetes mellitus)   . Hypertension   . CVA (cerebral infarction) 1997    no residual deficit  . Anemia   . Renal insufficiency   . Vitamin D deficiency   . Diverticulitis   . Stroke 1997  . CHF (congestive heart failure)    Past Surgical History  Procedure Laterality Date  . Cesarean section      x 2   Family History  Problem Relation Age of Onset  . Heart attack Brother     MI in his 61s  . Cancer Sister     breast cancer  . Breast cancer Sister   . Stroke Mother    History  Substance Use Topics  . Smoking status: Never Smoker   . Smokeless tobacco: Never Used  . Alcohol Use: No   OB History    Gravida Para Term Preterm AB TAB SAB Ectopic Multiple Living   3 3 3             Review of Systems  Constitutional: Negative for fever,  chills, diaphoresis, appetite change and fatigue.  HENT: Negative for congestion, ear pain, facial swelling, mouth sores and sore throat.   Eyes: Negative for visual disturbance.  Respiratory: Positive for cough and shortness of breath. Negative for sputum production, chest tightness and wheezing.   Cardiovascular: Negative for chest pain, palpitations and PND.  Gastrointestinal: Negative for nausea, vomiting, abdominal pain, diarrhea and blood in stool.  Endocrine: Negative for cold intolerance and heat intolerance.  Genitourinary: Negative for frequency, decreased urine volume and difficulty urinating.  Musculoskeletal: Negative for back pain and neck stiffness.  Skin: Negative for rash.  Neurological: Negative for dizziness, weakness, light-headedness and headaches.  All other systems reviewed and are negative.     Allergies  Ace inhibitors; Lipitor; and Minoxidil  Home Medications   Prior to Admission medications   Medication Sig Start Date End Date Taking? Authorizing Provider  aspirin 81 MG tablet Take 81 mg by mouth daily.      Historical Provider, MD  canagliflozin (INVOKANA) 100 MG TABS tablet Take 1 tablet (100 mg total) by mouth daily. 09/25/14   Vicie Mutters, PA-C  Cholecalciferol (VITAMIN D-3) 5000 UNITS TABS Take 1 tablet by mouth daily.    Historical Provider, MD  cloNIDine (CATAPRES - DOSED IN MG/24 HR)  0.2 mg/24hr patch Place 1 patch (0.2 mg total) onto the skin every 7 (seven) days. 03/22/15   Vicie Mutters, PA-C  cyclobenzaprine (FLEXERIL) 10 MG tablet Take 1 tablet (10 mg total) by mouth 2 (two) times daily as needed for muscle spasms. 11/15/14   Britt Bottom, NP  furosemide (LASIX) 40 MG tablet Take 1 tablet (40 mg total) by mouth daily. 01/07/15   Lelon Perla, MD  glucose blood test strip Freestyle lite test strips, check sugar 3 times daily. DX 250.41 08/04/14   Vicie Mutters, PA-C  hydrALAZINE (APRESOLINE) 100 MG tablet Take 1 tablet (100 mg total) by  mouth every 8 (eight) hours. 03/18/15   Unk Pinto, MD  Insulin Aspart Prot & Aspart (NOVOLOG MIX 70/30 FLEXPEN) (70-30) 100 UNIT/ML Pen Inject 20 Units into the skin 2 (two) times daily. 08/04/14   Vicie Mutters, PA-C  labetalol (NORMODYNE) 300 MG tablet Take 1 tablet (300 mg total) by mouth 2 (two) times daily. 12/28/14   Lelon Perla, MD  Lancets MISC Use as directed 02/20/13   Historical Provider, MD  losartan (COZAAR) 50 MG tablet Take 1 tablet (50 mg total) by mouth 2 (two) times daily. 12/30/14   Unk Pinto, MD  Magnesium 250 MG TABS Take 1 tablet by mouth daily.    Historical Provider, MD  metFORMIN (GLUCOPHAGE XR) 500 MG 24 hr tablet Take 1 to 2 tablets 2 x daily with meal as directed for Diabetes 03/29/15 03/28/16  Unk Pinto, MD  naproxen sodium (ANAPROX) 220 MG tablet Take 220 mg by mouth as needed.    Historical Provider, MD  potassium chloride SA (K-DUR,KLOR-CON) 20 MEQ tablet Take 1 tablet (20 mEq total) by mouth daily. 11/30/14   Vicie Mutters, PA-C  rosuvastatin (CRESTOR) 20 MG tablet Take 1 tablet (20 mg total) by mouth daily. 11/17/14   Unk Pinto, MD   BP 166/108 mmHg  Pulse 87  Temp(Src) 98.1 F (36.7 C) (Oral)  Resp 25  Ht 4' 11.5" (1.511 m)  Wt 194 lb (87.998 kg)  BMI 38.54 kg/m2  SpO2 99% Physical Exam  Constitutional: She is oriented to person, place, and time. She appears well-developed and well-nourished. No distress.  HENT:  Head: Normocephalic and atraumatic.  Right Ear: External ear normal.  Left Ear: External ear normal.  Nose: Nose normal.  Eyes: Conjunctivae and EOM are normal. Pupils are equal, round, and reactive to light. Right eye exhibits no discharge. Left eye exhibits no discharge. No scleral icterus.  Neck: Normal range of motion. Neck supple.  Cardiovascular: Normal rate, regular rhythm and normal heart sounds.  Exam reveals no gallop and no friction rub.   No murmur heard. Pulmonary/Chest: Effort normal and breath sounds normal. No  stridor. No respiratory distress. She has no wheezes.  Abdominal: Soft. She exhibits no distension. There is no tenderness.  Musculoskeletal: She exhibits no edema or tenderness.  Neurological: She is alert and oriented to person, place, and time.  Skin: Skin is warm and dry. No rash noted. She is not diaphoretic. No erythema.  Psychiatric: She has a normal mood and affect.    ED Course  Procedures (including critical care time) Labs Review Labs Reviewed  CBC - Abnormal; Notable for the following:    Hemoglobin 10.3 (*)    HCT 31.7 (*)    MCH 25.9 (*)    All other components within normal limits  BRAIN NATRIURETIC PEPTIDE - Abnormal; Notable for the following:    B Natriuretic Peptide 205.6 (*)  All other components within normal limits  COMPREHENSIVE METABOLIC PANEL - Abnormal; Notable for the following:    Potassium 3.2 (*)    Glucose, Bld 138 (*)    BUN 31 (*)    Creatinine, Ser 1.79 (*)    ALT 13 (*)    GFR calc non Af Amer 30 (*)    GFR calc Af Amer 34 (*)    All other components within normal limits  I-STAT TROPOININ, ED    Imaging Review Dg Chest Port 1 View  05/27/2015   CLINICAL DATA:  59 year old female with shortness of breath x2 days  EXAM: PORTABLE CHEST - 1 VIEW  COMPARISON:  Chest radiograph dated 11/30/2014  FINDINGS: Single view of the chest demonstrate stable increased airspace density in the left lower lung field, likely atelectatic changes. Superimposed pneumonia is not excluded. No significant pleural effusion. No pneumothorax. Stable cardiomegaly.  IMPRESSION: Subsegmental left lung base atelectasis. No significant interval change.   Electronically Signed   By: Anner Crete M.D.   On: 05/27/2015 15:38     EKG Interpretation   Date/Time:  Thursday May 27 2015 15:26:42 EDT Ventricular Rate:  85 PR Interval:  223 QRS Duration: 104 QT Interval:  426 QTC Calculation: 507 R Axis:   26 Text Interpretation:  Sinus rhythm Prolonged PR interval  Borderline T  abnormalities, lateral leads Borderline prolonged QT interval Sinus rhythm  T wave abnormality Artifact Abnormal ekg Confirmed by Carmin Muskrat  MD  [phone]) on 05/27/2015 4:11:24 PM      MDM   59 year old female with a history of hypertension, diabetes, anemia, prior stroke, newly diagnosed CHF and pericardial effusion presents with 2 days of worsening shortness of breath. History and exam as above. On arrival she was hypoxic with sats in the 80s and immediately placed on nasal cannula.  she has been maintaining her saturations on oxygen. Bedside ultrasound revealed no evidence of ascites however we did note moderate pericardial effusion. She reports that she saw cardiology 2 weeks ago and he told her that her effusion was mild. EKG with no evidence of acute ischemia or arrhythmias. Chest x-ray with subsegmental left base atelectasis. Patient denied recent fevers or productive cough. Doubt pneumonia. Troponin negative. CBC with baseline anemia. CMP with mild hypo-kalemia and baseline renal function. BNP mildly elevated at 205. Cardiology consult and assessed the patient in the ED. They will admit to the service for continued workup and management.  Patient remained hemodynamically stable and was transferred to the floor without competition.   Patient was seen in conjunction with Dr. Vanita Panda.  Final diagnoses:  Pericardial effusion  Shortness of breath        Addison Lank, MD 05/27/15 1805  Carmin Muskrat, MD 05/28/15 1755

## 2015-05-28 ENCOUNTER — Observation Stay (HOSPITAL_COMMUNITY): Payer: BLUE CROSS/BLUE SHIELD

## 2015-05-28 ENCOUNTER — Ambulatory Visit (HOSPITAL_COMMUNITY): Payer: BLUE CROSS/BLUE SHIELD

## 2015-05-28 DIAGNOSIS — E1122 Type 2 diabetes mellitus with diabetic chronic kidney disease: Secondary | ICD-10-CM | POA: Diagnosis present

## 2015-05-28 DIAGNOSIS — J9811 Atelectasis: Secondary | ICD-10-CM | POA: Diagnosis present

## 2015-05-28 DIAGNOSIS — I340 Nonrheumatic mitral (valve) insufficiency: Secondary | ICD-10-CM

## 2015-05-28 DIAGNOSIS — Z79899 Other long term (current) drug therapy: Secondary | ICD-10-CM | POA: Diagnosis not present

## 2015-05-28 DIAGNOSIS — E559 Vitamin D deficiency, unspecified: Secondary | ICD-10-CM | POA: Diagnosis present

## 2015-05-28 DIAGNOSIS — Z791 Long term (current) use of non-steroidal anti-inflammatories (NSAID): Secondary | ICD-10-CM | POA: Diagnosis not present

## 2015-05-28 DIAGNOSIS — I10 Essential (primary) hypertension: Secondary | ICD-10-CM | POA: Diagnosis not present

## 2015-05-28 DIAGNOSIS — I5033 Acute on chronic diastolic (congestive) heart failure: Secondary | ICD-10-CM | POA: Diagnosis present

## 2015-05-28 DIAGNOSIS — R0602 Shortness of breath: Secondary | ICD-10-CM | POA: Diagnosis present

## 2015-05-28 DIAGNOSIS — I313 Pericardial effusion (noninflammatory): Secondary | ICD-10-CM

## 2015-05-28 DIAGNOSIS — I272 Other secondary pulmonary hypertension: Secondary | ICD-10-CM | POA: Diagnosis present

## 2015-05-28 DIAGNOSIS — Z7982 Long term (current) use of aspirin: Secondary | ICD-10-CM | POA: Diagnosis not present

## 2015-05-28 DIAGNOSIS — Z8673 Personal history of transient ischemic attack (TIA), and cerebral infarction without residual deficits: Secondary | ICD-10-CM | POA: Diagnosis not present

## 2015-05-28 DIAGNOSIS — D631 Anemia in chronic kidney disease: Secondary | ICD-10-CM | POA: Diagnosis present

## 2015-05-28 DIAGNOSIS — Z794 Long term (current) use of insulin: Secondary | ICD-10-CM | POA: Diagnosis not present

## 2015-05-28 DIAGNOSIS — Z6837 Body mass index (BMI) 37.0-37.9, adult: Secondary | ICD-10-CM | POA: Diagnosis not present

## 2015-05-28 DIAGNOSIS — R0902 Hypoxemia: Secondary | ICD-10-CM | POA: Diagnosis present

## 2015-05-28 DIAGNOSIS — E785 Hyperlipidemia, unspecified: Secondary | ICD-10-CM | POA: Diagnosis present

## 2015-05-28 DIAGNOSIS — I129 Hypertensive chronic kidney disease with stage 1 through stage 4 chronic kidney disease, or unspecified chronic kidney disease: Secondary | ICD-10-CM | POA: Diagnosis present

## 2015-05-28 DIAGNOSIS — I319 Disease of pericardium, unspecified: Secondary | ICD-10-CM | POA: Diagnosis not present

## 2015-05-28 DIAGNOSIS — N184 Chronic kidney disease, stage 4 (severe): Secondary | ICD-10-CM | POA: Diagnosis present

## 2015-05-28 DIAGNOSIS — Z823 Family history of stroke: Secondary | ICD-10-CM | POA: Diagnosis not present

## 2015-05-28 DIAGNOSIS — Z888 Allergy status to other drugs, medicaments and biological substances status: Secondary | ICD-10-CM | POA: Diagnosis not present

## 2015-05-28 DIAGNOSIS — E669 Obesity, unspecified: Secondary | ICD-10-CM | POA: Diagnosis present

## 2015-05-28 DIAGNOSIS — Z8249 Family history of ischemic heart disease and other diseases of the circulatory system: Secondary | ICD-10-CM | POA: Diagnosis not present

## 2015-05-28 LAB — GLUCOSE, CAPILLARY
Glucose-Capillary: 131 mg/dL — ABNORMAL HIGH (ref 65–99)
Glucose-Capillary: 138 mg/dL — ABNORMAL HIGH (ref 65–99)
Glucose-Capillary: 140 mg/dL — ABNORMAL HIGH (ref 65–99)
Glucose-Capillary: 126 mg/dL — ABNORMAL HIGH (ref 65–99)

## 2015-05-28 LAB — BASIC METABOLIC PANEL
Anion gap: 9 (ref 5–15)
BUN: 33 mg/dL — ABNORMAL HIGH (ref 6–20)
CO2: 27 mmol/L (ref 22–32)
Calcium: 9 mg/dL (ref 8.9–10.3)
Chloride: 103 mmol/L (ref 101–111)
Creatinine, Ser: 1.79 mg/dL — ABNORMAL HIGH (ref 0.44–1.00)
GFR calc Af Amer: 34 mL/min — ABNORMAL LOW (ref >60)
GFR calc non Af Amer: 30 mL/min — ABNORMAL LOW (ref >60)
Glucose, Bld: 116 mg/dL — ABNORMAL HIGH (ref 65–99)
Potassium: 3.6 mmol/L (ref 3.5–5.1)
Sodium: 139 mmol/L (ref 135–145)

## 2015-05-28 LAB — ANTINUCLEAR ANTIBODIES, IFA: ANA Ab, IFA: NEGATIVE

## 2015-05-28 NOTE — Progress Notes (Signed)
Patient Name: Jody Nguyen Date of Encounter: 05/28/2015  Primary Cardiologist: Dr. Stanford Breed   Principal Problem:   Diastolic CHF, acute on chronic Active Problems:   Hypertension   Hyperlipidemia   T1_IDDM  w/CKD4 (GFR 35 ml/min)   Pericardial effusion    SUBJECTIVE  Denies any CP, her SOB improved with diuresis.   CURRENT MEDS . aspirin EC  81 mg Oral Daily  . cholecalciferol  1,000 Units Oral Daily  . cloNIDine  0.2 mg Transdermal Q7 days  . furosemide  40 mg Intravenous BID  . heparin  5,000 Units Subcutaneous 3 times per day  . hydrALAZINE  100 mg Oral 3 times per day  . insulin aspart  0-15 Units Subcutaneous TID WC  . labetalol  300 mg Oral BID  . losartan  50 mg Oral BID  . magnesium oxide  200 mg Oral Daily  . potassium chloride SA  20 mEq Oral Daily  . rosuvastatin  20 mg Oral q1800  . sodium chloride  3 mL Intravenous Q12H    OBJECTIVE  Filed Vitals:   05/27/15 1901 05/27/15 1906 05/27/15 2216 05/28/15 0419  BP:  163/94 112/65 149/89  Pulse:    80  Temp:    98.2 F (36.8 C)  TempSrc:    Oral  Resp:  22 20 21   Height:      Weight: 193 lb 12.6 oz (87.9 kg)   193 lb 8 oz (87.771 kg)  SpO2:  97% 98% 99%    Intake/Output Summary (Last 24 hours) at 05/28/15 1000 Last data filed at 05/28/15 0422  Gross per 24 hour  Intake      0 ml  Output    800 ml  Net   -800 ml   Filed Weights   05/27/15 1437 05/27/15 1901 05/28/15 0419  Weight: 194 lb (87.998 kg) 193 lb 12.6 oz (87.9 kg) 193 lb 8 oz (87.771 kg)    PHYSICAL EXAM  General: Pleasant, NAD. Neuro: Alert and oriented X 3. Moves all extremities spontaneously. Psych: Normal affect. HEENT:  Normal  Neck: Supple without bruits or JVD. Lungs:  Resp regular and unlabored, CTA. Heart: RRR no s3, s4. 2/6 systolic murmur at apex Abdomen: Soft, non-tender, non-distended, BS + x 4.  Extremities: No clubbing, cyanosis or edema. DP/PT/Radials 2+ and equal bilaterally.  Accessory Clinical  Findings  CBC  Recent Labs  05/27/15 1455 05/27/15 2017  WBC 5.3 6.0  HGB 10.3* 10.3*  HCT 31.7* 32.0*  MCV 79.6 79.8  PLT 251 [id number]   Basic Metabolic Panel  Recent Labs  05/27/15 1455 05/27/15 2017 05/28/15 0240  NA 137  --  139  K 3.2*  --  3.6  CL 101  --  103  CO2 28  --  27  GLUCOSE 138*  --  116*  BUN 31*  --  33*  CREATININE 1.79* 1.88* 1.79*  CALCIUM 9.4  --  9.0  MG  --  2.1  --    Liver Function Tests  Recent Labs  05/27/15 1455  AST 18  ALT 13*  ALKPHOS 51  BILITOT 1.2  PROT 6.8  ALBUMIN 3.7   D-Dimer  Recent Labs  05/27/15 2017  DDIMER 2.61*    TELE NSR with HR 80s    ECG  No new EKG  Echocardiogram 05/06/2015  LV EF: 55% -  60%  ------------------------------------------------------------------- Indications:   Pericardial Effusion (I31.9).  ------------------------------------------------------------------- History:  PMH: History of Moderate Pericardial Effusion, Left Ventricular  Hypertrophy. Congestive heart failure. Stroke. Risk factors: Hypertension. Diabetes mellitus. Dyslipidemia.  ------------------------------------------------------------------- Study Conclusions  - Left ventricle: The cavity size was normal. Wall thickness was normal. Systolic function was normal. The estimated ejection fraction was in the range of 55% to 60%. - Mitral valve: There was mild regurgitation. - Pericardium, extracardiac: Small pericardial effusion surrounds heart. Does not appear to be hemodynamically compromising. A small pericardial effusion was identified.    Radiology/Studies  Dg Chest 2 View  05/28/2015   CLINICAL DATA:  Dyspnea, chest heaviness  EXAM: CHEST  2 VIEW  COMPARISON:  05/27/2015  FINDINGS: Cardiomegaly again noted. There is central vascular congestion and mild perihilar interstitial prominence suspicious for mild interstitial edema. No segmental infiltrate.  IMPRESSION: Cardiomegaly again noted.  Central mild vascular congestion. Mild interstitial prominence suspicious for mild edema.   Electronically Signed   By: Lahoma Crocker M.D.   On: 05/28/2015 08:07   Dg Chest Port 1 View  05/27/2015   CLINICAL DATA:  59 year old female with shortness of breath x2 days  EXAM: PORTABLE CHEST - 1 VIEW  COMPARISON:  Chest radiograph dated 11/30/2014  FINDINGS: Single view of the chest demonstrate stable increased airspace density in the left lower lung field, likely atelectatic changes. Superimposed pneumonia is not excluded. No significant pleural effusion. No pneumothorax. Stable cardiomegaly.  IMPRESSION: Subsegmental left lung base atelectasis. No significant interval change.   Electronically Signed   By: Anner Crete M.D.   On: 05/27/2015 15:38   Mm Digital Screening Bilateral  05/25/2015   CLINICAL DATA:  Screening.  EXAM: DIGITAL SCREENING BILATERAL MAMMOGRAM WITH CAD  COMPARISON:  Previous exam(s).  ACR Breast Density Category c: The breast tissue is heterogeneously dense, which may obscure small masses.  FINDINGS: There are no findings suspicious for malignancy. Images were processed with CAD.  IMPRESSION: No mammographic evidence of malignancy. A result letter of this screening mammogram will be mailed directly to the patient.  RECOMMENDATION: Screening mammogram in one year. (Code:[id number])  BI-RADS CATEGORY  1: Negative.   Electronically Signed   By: Conchita Paris M.D.   On: 05/25/2015 13:22    ASSESSMENT AND PLAN   Jody Nguyen is a 59 y.o. female with a history of malignant HTN, diastolic heart failure, stroke, DM, CKD4 , Hyperlipidemia, vita D deficiency and anemia who presented to ED with worsening SOB. Cardiac MRI in 10/2014 showed large pericardial effusion. Echo in Jan 2016 showed moderate pericardial effusion.   1. Dyspnea  - CXR showed subsegmental left lung base atelectasis and superimposed pneumonia is not excluded. Repeat PA/Lateral CXR this morning showed mild vascular  congestion and interstitial edema  2. Acute on chronic diastolic HF   - continue IV lasix for 1 more day, Cr improving with diuresis  3. H/o pericardial effusion: need to r/o recurrent pericardial effusion  - pending echo to assess pericardial effusion  4. H/o mitral regurg: previous echo showed mild MR, will followup with echo  5. Acute on chronic renal insufficiency, stage III-IV   6. Elevated d-dimer: also complained of L lateral LE tingling (location is slightly atypical), HR in 80s, breathing improved after diuresis, low suspicion for PE, will hold off on further workup for now.   7. HTN: on several BP medications, however per report negative U/S for RAS in 2013  8. HLD   Signed, Almyra Deforest PA-C Pager: [phone]  Personally seen and examined. Agree with above. Feels better with IV lasix, continue 3/6 SM - ECHO ? Worsened MR. May  need TEE if worsened.  Do not feel high suspicion for PE (D-dimer elevated but other reasons for elevation, obesity for instance). Since dyspnea improved with diuresis (would not see this with PE) will hold off on V/Q scan. Creat 1.8. I take care of her husband.  Candee Furbish, MD

## 2015-05-28 NOTE — Progress Notes (Signed)
  Echocardiogram 2D Echocardiogram has been performed.  Jennette Dubin 05/28/2015, 11:27 AM

## 2015-05-29 DIAGNOSIS — I10 Essential (primary) hypertension: Secondary | ICD-10-CM

## 2015-05-29 LAB — GLUCOSE, CAPILLARY
Glucose-Capillary: 147 mg/dL — ABNORMAL HIGH (ref 65–99)
Glucose-Capillary: 124 mg/dL — ABNORMAL HIGH (ref 65–99)
Glucose-Capillary: 151 mg/dL — ABNORMAL HIGH (ref 65–99)
Glucose-Capillary: 150 mg/dL — ABNORMAL HIGH (ref 65–99)

## 2015-05-29 LAB — BASIC METABOLIC PANEL
Anion gap: 9 (ref 5–15)
BUN: 31 mg/dL — ABNORMAL HIGH (ref 6–20)
CO2: 29 mmol/L (ref 22–32)
Calcium: 9.4 mg/dL (ref 8.9–10.3)
Chloride: 102 mmol/L (ref 101–111)
Creatinine, Ser: 1.76 mg/dL — ABNORMAL HIGH (ref 0.44–1.00)
GFR calc Af Amer: 35 mL/min — ABNORMAL LOW (ref >60)
GFR calc non Af Amer: 30 mL/min — ABNORMAL LOW (ref >60)
Glucose, Bld: 148 mg/dL — ABNORMAL HIGH (ref 65–99)
Potassium: 3.6 mmol/L (ref 3.5–5.1)
Sodium: 140 mmol/L (ref 135–145)

## 2015-05-29 NOTE — Progress Notes (Signed)
Primary cardiologist: Dr. [name]  Seen for followup: Diastolic heart failure, mitral regurgitation  Subjective:    Feeling better, breathing more easily. No chest pain or palpitations. Has not ambulated much as yet.  Objective:   Temp:  [97.8 F (36.6 C)-98.4 F (36.9 C)] 98.4 F (36.9 C) (07/02 0406) Pulse Rate:  [81-87] 87 (07/02 0944) Resp:  [16-21] 18 (07/02 0406) BP: (111-145)/(59-81) 130/68 mmHg (07/02 0944) SpO2:  [91 %-98 %] 91 % (07/02 0406) Weight:  [190 lb 1.6 oz (86.229 kg)] 190 lb 1.6 oz (86.229 kg) (07/02 0406) Last BM Date: 05/28/15  Filed Weights   05/27/15 1901 05/28/15 0419 05/29/15 0406  Weight: 193 lb 12.6 oz (87.9 kg) 193 lb 8 oz (87.771 kg) 190 lb 1.6 oz (86.229 kg)    Intake/Output Summary (Last 24 hours) at 05/29/15 1059 Last data filed at 05/29/15 0700  Gross per 24 hour  Intake    720 ml  Output   1950 ml  Net  -1230 ml    Telemetry: Sinus rhythm.  Exam:  General: Obese, no distress.  Lungs: Clear with a few crackles at the very bases.  Cardiac: RRR with 3/6 apical holosystolic murmur.  Abdomen: NABS.  Extremities: No pitting edema.  Lab Results:  Basic Metabolic Panel:  Recent Labs Lab 05/27/15 1455 05/27/15 2017 05/28/15 0240 05/29/15 0358  NA 137  --  139 140  K 3.2*  --  3.6 3.6  CL 101  --  103 102  CO2 28  --  27 29  GLUCOSE 138*  --  116* 148*  BUN 31*  --  33* 31*  CREATININE 1.79* 1.88* 1.79* 1.76*  CALCIUM 9.4  --  9.0 9.4  MG  --  2.1  --   --     Liver Function Tests:  Recent Labs Lab 05/27/15 1455  AST 18  ALT 13*  ALKPHOS 51  BILITOT 1.2  PROT 6.8  ALBUMIN 3.7    CBC:  Recent Labs Lab 05/27/15 1455 05/27/15 2017  WBC 5.3 6.0  HGB 10.3* 10.3*  HCT 31.7* 32.0*  MCV 79.6 79.8  PLT 251 244    Echocardiogram 05/28/2015: Study Conclusions  - Left ventricle: The cavity size was normal. Wall thickness was increased in a pattern of mild LVH. Systolic function was  normal. The estimated ejection fraction was in the range of 55% to 60%. Wall motion was normal; there were no regional wall motion abnormalities. Features are consistent with a pseudonormal left ventricular filling pattern, with concomitant abnormal relaxation and increased filling pressure (grade 2 diastolic dysfunction). - Aortic valve: There was no stenosis. - Mitral valve: Mildly calcified annulus. There was at least moderate mitral regurgitation. MR was very eccentric, posteriorly-directed. Possible that there is some prolapse of A2 segment leading to the MR but difficult to tell mechanism. - Left atrium: The atrium was moderately dilated. - Right ventricle: The cavity size was normal. Systolic function was normal. - Atrial septum: The septum bowed from left to right, consistent with increased left atrial pressure. - Tricuspid valve: Peak RV-RA gradient (S): 44 mm Hg. - Pulmonary arteries: PA peak pressure: 47 mm Hg (S). - Inferior vena cava: The vessel was normal in size. The respirophasic diameter changes were in the normal range (>= 50%), consistent with normal central venous pressure. - Pericardium, extracardiac: A small circumferential pericardial effusion was identified, no tamponade.  Impressions:  - Normal LV size with mild LV hypertrophy. EF 55-60%. Moderate diastolic dysfunction.  Very eccentric, posteriorly-directed mitral regurgitation, at least moderate. Not fully visualized. I am not definitely sure of the mechanism but may be prolapse of A2 segment of anterior MV leaflet. Normal RV size and systolic function. Mild pulmonary hypertension. Small circumferential pericardial effusion without tamponade. Would consider TEE to more closely assess the MR.   Medications:   Scheduled Medications: . aspirin EC  81 mg Oral Daily  . cholecalciferol  1,000 Units Oral Daily  . cloNIDine  0.2 mg Transdermal Q7 days  . furosemide  40  mg Intravenous BID  . heparin  5,000 Units Subcutaneous 3 times per day  . hydrALAZINE  100 mg Oral 3 times per day  . insulin aspart  0-15 Units Subcutaneous TID WC  . labetalol  300 mg Oral BID  . losartan  50 mg Oral BID  . magnesium oxide  200 mg Oral Daily  . potassium chloride SA  20 mEq Oral Daily  . rosuvastatin  20 mg Oral q1800  . sodium chloride  3 mL Intravenous Q12H     PRN Medications:  sodium chloride, acetaminophen, cyclobenzaprine, ondansetron (ZOFRAN) IV, sodium chloride   Assessment:   1. Acute on chronic diastolic heart failure complicated by what looks to be significant mitral regurgitation by follow-up echocardiogram. She is feeling better with diuresis, weight is down about 4 pounds.  2. Small circumferential pericardial effusion, stable.  3. At least moderate mitral regurgitation, eccentric. PASP 47 mmHg.  4. CKD, stage 3. Creatinine 1.7.  Plan/Discussion:    Would continue IV diuresis for now. Ambulate with assistance, check ambulatory oxygen saturation on room air. depending on how she does clinically, can decide on whether she needs an inpatient TEE, or whether this can be done more electively as an outpatient for further assessment of her mitral valve.   Satira Sark, M.D., F.A.C.C.

## 2015-05-30 LAB — CBC
HCT: 31.2 % — ABNORMAL LOW (ref 36.0–46.0)
Hemoglobin: 9.9 g/dL — ABNORMAL LOW (ref 12.0–15.0)
MCH: 25.7 pg — ABNORMAL LOW (ref 26.0–34.0)
MCHC: 31.7 g/dL (ref 30.0–36.0)
MCV: 81 fL (ref 78.0–100.0)
Platelets: 282 10*3/uL (ref 150–400)
RBC: 3.85 MIL/uL — ABNORMAL LOW (ref 3.87–5.11)
RDW: 15.3 % (ref 11.5–15.5)
WBC: 5.3 10*3/uL (ref 4.0–10.5)

## 2015-05-30 LAB — GLUCOSE, CAPILLARY
Glucose-Capillary: 150 mg/dL — ABNORMAL HIGH (ref 65–99)
Glucose-Capillary: 131 mg/dL — ABNORMAL HIGH (ref 65–99)
Glucose-Capillary: 109 mg/dL — ABNORMAL HIGH (ref 65–99)
Glucose-Capillary: 157 mg/dL — ABNORMAL HIGH (ref 65–99)

## 2015-05-30 LAB — BASIC METABOLIC PANEL
Anion gap: 10 (ref 5–15)
BUN: 32 mg/dL — ABNORMAL HIGH (ref 6–20)
CO2: 29 mmol/L (ref 22–32)
Calcium: 9.5 mg/dL (ref 8.9–10.3)
Chloride: 102 mmol/L (ref 101–111)
Creatinine, Ser: 1.83 mg/dL — ABNORMAL HIGH (ref 0.44–1.00)
GFR calc Af Amer: 33 mL/min — ABNORMAL LOW (ref >60)
GFR calc non Af Amer: 29 mL/min — ABNORMAL LOW (ref >60)
Glucose, Bld: 152 mg/dL — ABNORMAL HIGH (ref 65–99)
Potassium: 3.9 mmol/L (ref 3.5–5.1)
Sodium: 141 mmol/L (ref 135–145)

## 2015-05-30 NOTE — Progress Notes (Signed)
Patient complained of feeling short of breath. Patient's respirations even, unlabored, RR 16, placed on O2, 100% SaO2 on 2 liters. Patient states she does not feel anymore shortness of breath with oxygen on. Patient complained of poking pain in left side of chest, stating it was "not necessarily pain" but a poking sensation below left breast. Patient stated this was new however only occurred once in AM. VSS BP 124/72, 100% on 2 liters nasal canula, pulse 86. MD aware. RN to ambulate patient. Patient is now sitting and speaking with husband at bedside. Denies any pain at this time. Call bell within patient's reach. Will continue to monitor closely.

## 2015-05-30 NOTE — Progress Notes (Signed)
Patient ambulated 250 feet with RN. At beginning of ambulation, SaO2 96% on room air,O2 saturation dropped to 92% during ambulation, RN noted patient occasionally holding breath, when patient encouraged to take breath through nose and out mouth, oxygen saturations improved back to 94%. Patient got self back in bed, denied any dizziness or feeling lightheaded while ambulating. RN discussed with patient about positioning trunk for more effective oxygenation and easier breaths when feeling short of breath. Patient vocalized understanding. Patient is now sitting in bed, talking on the phone with husband at bedside. Call bell within patient's reach, will continue to monitor closely.

## 2015-05-30 NOTE — Progress Notes (Signed)
SATURATION QUALIFICATIONS: (This note is used to comply with regulatory documentation for home oxygen)  Patient Saturations on Room Air at Rest = 93%  Patient Saturations on Room Air while Ambulating = 89%   Patient returned to 93% O2 saturation once returning to sitting position in bed. While ambulating, patient complained of "feeling tired." No O2 applied during ambulation. Patient is now sitting at edge of bed, denies pain, reading newspaper. Will continue to monitor closely.

## 2015-05-30 NOTE — Progress Notes (Signed)
Primary cardiologist: Dr. [name]  Seen for followup: Diastolic heart failure, mitral regurgitation  Subjective:    Continues to breathe more easily with diuresis, did have some mild oxygen saturation with ambulation yesterday however. No chest pain or palpitations.  Objective:   Temp:  [98 F (36.7 C)-98.4 F (36.9 C)] 98.4 F (36.9 C) (07/03 0559) Pulse Rate:  [80-88] 80 (07/03 0559) Resp:  [16-20] 20 (07/03 0559) BP: (117-152)/(58-85) 118/58 mmHg (07/03 0559) SpO2:  [87 %-99 %] 99 % (07/03 0559) Weight:  [189 lb 1.6 oz (85.775 kg)] 189 lb 1.6 oz (85.775 kg) (07/03 0559) Last BM Date: 05/29/15  Filed Weights   05/28/15 0419 05/29/15 0406 05/30/15 0559  Weight: 193 lb 8 oz (87.771 kg) 190 lb 1.6 oz (86.229 kg) 189 lb 1.6 oz (85.775 kg)    Intake/Output Summary (Last 24 hours) at 05/30/15 0901 Last data filed at 05/30/15 0639  Gross per 24 hour  Intake      0 ml  Output   2000 ml  Net  -2000 ml    Telemetry: Sinus rhythm.  Exam:  General: Obese, no distress.  Lungs: Clear with a few crackles at the very bases.  Cardiac: RRR with 3/6 apical holosystolic murmur.  Abdomen: NABS.  Extremities: No pitting edema.  Lab Results:  Basic Metabolic Panel:  Recent Labs Lab 05/27/15 2017 05/28/15 0240 05/29/15 0358 05/30/15 0255  NA  --  139 140 141  K  --  3.6 3.6 3.9  CL  --  103 102 102  CO2  --  27 29 29   GLUCOSE  --  116* 148* 152*  BUN  --  33* 31* 32*  CREATININE 1.88* 1.79* 1.76* 1.83*  CALCIUM  --  9.0 9.4 9.5  MG 2.1  --   --   --     Liver Function Tests:  Recent Labs Lab 05/27/15 1455  AST 18  ALT 13*  ALKPHOS 51  BILITOT 1.2  PROT 6.8  ALBUMIN 3.7    CBC:  Recent Labs Lab 05/27/15 1455 05/27/15 2017 05/30/15 0255  WBC 5.3 6.0 5.3  HGB 10.3* 10.3* 9.9*  HCT 31.7* 32.0* 31.2*  MCV 79.6 79.8 81.0  PLT 251 244 282    Echocardiogram 05/28/2015: Study Conclusions  - Left ventricle: The cavity size was normal. Wall  thickness was increased in a pattern of mild LVH. Systolic function was normal. The estimated ejection fraction was in the range of 55% to 60%. Wall motion was normal; there were no regional wall motion abnormalities. Features are consistent with a pseudonormal left ventricular filling pattern, with concomitant abnormal relaxation and increased filling pressure (grade 2 diastolic dysfunction). - Aortic valve: There was no stenosis. - Mitral valve: Mildly calcified annulus. There was at least moderate mitral regurgitation. MR was very eccentric, posteriorly-directed. Possible that there is some prolapse of A2 segment leading to the MR but difficult to tell mechanism. - Left atrium: The atrium was moderately dilated. - Right ventricle: The cavity size was normal. Systolic function was normal. - Atrial septum: The septum bowed from left to right, consistent with increased left atrial pressure. - Tricuspid valve: Peak RV-RA gradient (S): 44 mm Hg. - Pulmonary arteries: PA peak pressure: 47 mm Hg (S). - Inferior vena cava: The vessel was normal in size. The respirophasic diameter changes were in the normal range (>= 50%), consistent with normal central venous pressure. - Pericardium, extracardiac: A small circumferential pericardial effusion was identified, no tamponade.  Impressions:  - Normal LV size with mild LV hypertrophy. EF 55-60%. Moderate diastolic dysfunction. Very eccentric, posteriorly-directed mitral regurgitation, at least moderate. Not fully visualized. I am not definitely sure of the mechanism but may be prolapse of A2 segment of anterior MV leaflet. Normal RV size and systolic function. Mild pulmonary hypertension. Small circumferential pericardial effusion without tamponade. Would consider TEE to more closely assess the MR.   Medications:   Scheduled Medications: . aspirin EC  81 mg Oral Daily  . cholecalciferol  1,000  Units Oral Daily  . cloNIDine  0.2 mg Transdermal Q7 days  . furosemide  40 mg Intravenous BID  . heparin  5,000 Units Subcutaneous 3 times per day  . hydrALAZINE  100 mg Oral 3 times per day  . insulin aspart  0-15 Units Subcutaneous TID WC  . labetalol  300 mg Oral BID  . losartan  50 mg Oral BID  . magnesium oxide  200 mg Oral Daily  . potassium chloride SA  20 mEq Oral Daily  . rosuvastatin  20 mg Oral q1800  . sodium chloride  3 mL Intravenous Q12H    PRN Medications: sodium chloride, acetaminophen, cyclobenzaprine, ondansetron (ZOFRAN) IV, sodium chloride   Assessment:   1. Acute on chronic diastolic heart failure complicated by what looks to be significant mitral regurgitation by follow-up echocardiogram. She is feeling better with diuresis, weight is down 5 pounds. Still had some mild oxygen desaturation yesterday with ambulation.  2. Small circumferential pericardial effusion, stable.  3. At least moderate mitral regurgitation, eccentric. PASP 47 mmHg.  4. CKD, stage 3. Creatinine 1.8.  Plan/Discussion:    Would continue IV diuresis another 24 hours. Recheck oxygen saturation with ambulation on room air tomorrow morning. If improved, possible discharge tomorrow on oral diuretic and follow-up with Dr. Stanford Breed for discussion of outpatient TEE. Otherwise, we can keep her here for further inpatient workup.  Jody Nguyen, M.D., F.A.C.C.

## 2015-05-31 ENCOUNTER — Inpatient Hospital Stay (HOSPITAL_COMMUNITY): Payer: BLUE CROSS/BLUE SHIELD

## 2015-05-31 ENCOUNTER — Other Ambulatory Visit: Payer: Self-pay | Admitting: Physician Assistant

## 2015-05-31 ENCOUNTER — Encounter (HOSPITAL_COMMUNITY): Payer: Self-pay | Admitting: Physician Assistant

## 2015-05-31 ENCOUNTER — Telehealth: Payer: Self-pay | Admitting: Physician Assistant

## 2015-05-31 DIAGNOSIS — I5032 Chronic diastolic (congestive) heart failure: Secondary | ICD-10-CM

## 2015-05-31 DIAGNOSIS — I340 Nonrheumatic mitral (valve) insufficiency: Secondary | ICD-10-CM

## 2015-05-31 DIAGNOSIS — N184 Chronic kidney disease, stage 4 (severe): Secondary | ICD-10-CM

## 2015-05-31 LAB — GLUCOSE, CAPILLARY
Glucose-Capillary: 164 mg/dL — ABNORMAL HIGH (ref 65–99)
Glucose-Capillary: 144 mg/dL — ABNORMAL HIGH (ref 65–99)

## 2015-05-31 LAB — BASIC METABOLIC PANEL
Anion gap: 9 (ref 5–15)
BUN: 30 mg/dL — ABNORMAL HIGH (ref 6–20)
CO2: 29 mmol/L (ref 22–32)
Calcium: 9.7 mg/dL (ref 8.9–10.3)
Chloride: 99 mmol/L — ABNORMAL LOW (ref 101–111)
Creatinine, Ser: 1.71 mg/dL — ABNORMAL HIGH (ref 0.44–1.00)
GFR calc Af Amer: 36 mL/min — ABNORMAL LOW (ref >60)
GFR calc non Af Amer: 31 mL/min — ABNORMAL LOW (ref >60)
Glucose, Bld: 160 mg/dL — ABNORMAL HIGH (ref 65–99)
Potassium: 3.7 mmol/L (ref 3.5–5.1)
Sodium: 137 mmol/L (ref 135–145)

## 2015-05-31 LAB — CBC
HCT: 31 % — ABNORMAL LOW (ref 36.0–46.0)
Hemoglobin: 10 g/dL — ABNORMAL LOW (ref 12.0–15.0)
MCH: 26.3 pg (ref 26.0–34.0)
MCHC: 32.3 g/dL (ref 30.0–36.0)
MCV: 81.6 fL (ref 78.0–100.0)
Platelets: 290 10*3/uL (ref 150–400)
RBC: 3.8 MIL/uL — ABNORMAL LOW (ref 3.87–5.11)
RDW: 15.3 % (ref 11.5–15.5)
WBC: 5.1 10*3/uL (ref 4.0–10.5)

## 2015-05-31 MED ORDER — TECHNETIUM TO 99M ALBUMIN AGGREGATED
6.0000 | Freq: Once | INTRAVENOUS | Status: AC | PRN
  Administered 2015-05-31: 6 via INTRAVENOUS

## 2015-05-31 MED ORDER — FUROSEMIDE 40 MG PO TABS: 60.0000 mg | ORAL_TABLET | Freq: Two times a day (BID) | ORAL | Status: DC

## 2015-05-31 MED ORDER — SODIUM CHLORIDE 0.9 % IV SOLN: INTRAVENOUS | Status: DC

## 2015-05-31 MED ORDER — TECHNETIUM TC 99M DIETHYLENETRIAME-PENTAACETIC ACID: 40.0000 | Freq: Once | INTRAVENOUS | Status: DC | PRN

## 2015-05-31 NOTE — Care Management Note (Signed)
Case Management Note  Patient Details  Name: Jody Nguyen MRN: [id number] Date of Birth: [birth year]  Subjective/Objective:                   Admitted with worsening SOB, patient has history of malignant HTN, diastolic heart failure, stroke, DM, CKD4 , Hyperlipidemia, vita D deficiency and anemia.   Action/Plan:   Expected Discharge Date:                  Expected Discharge Plan:  Home/Self Care  In-House Referral:     Discharge planning Services  CM Consult  Post Acute Care Choice:    Choice offered to:     DME Arranged:    DME Agency:     HH Arranged:    HH Agency:     Status of Service:  In process, will continue to follow  Medicare Important Message Given:    Date Medicare IM Given:    Medicare IM give by:    Date Additional Medicare IM Given:    Additional Medicare Important Message give by:     If discussed at [place] of Stay Meetings, dates discussed:    Additional Comments:  Dimas Aguas, RN 05/31/2015, 8:26 AM

## 2015-05-31 NOTE — Discharge Instructions (Signed)
1.  Weigh daily. If you have a weight gain of 2 pounds or greater in one day, take an extra dose of Lasix 40 mg in addition to your usual, daily dose.  2.  Do not take Metformin.  Discuss this with Alesia Richards, MD.

## 2015-05-31 NOTE — Discharge Summary (Signed)
Discharge Summary    Patient ID: Jody Nguyen, MRN: [id number], DOB/AGE: 60/20/1957 60 y.o.  Admit date: 05/27/2015 Discharge date: 05/31/2015  Primary Care Physician:  [facility] DAV[facility]   Primary Cardiologist:  Dr. [name]    Reason for Admission:  Acute on Chronic Diastolic CHF   Primary Discharge Diagnoses:  Principal Problem:   Acute on chronic diastolic CHF (congestive heart failure) Active Problems:   Moderate mitral regurgitation   Hypertension   Hyperlipidemia   Anemia in chronic kidney disease   Pericardial effusion   IDDM (insulin dependent diabetes mellitus)   CKD (chronic kidney disease) stage 4, GFR 15-29 ml/min   Elevated d-dimer    Wt Readings from Last 3 Encounters:  05/31/15 189 lb 6 oz (85.9 kg)  04/30/15 191 lb 4.8 oz (86.773 kg)  03/29/15 184 lb 9.6 oz (83.734 kg)    Secondary Discharge Diagnoses:   Past Medical History  Diagnosis Date  . Obesity   . HLD (hyperlipidemia)   . Vitamin D deficiency   . DM (diabetes mellitus)   . Hypertension     malignant; Renal Art [place] 11/13: no RAS  . CVA (cerebral infarction) 1997    no residual deficit  . Anemia   . CKD (chronic kidney disease) stage 4, GFR 15-29 ml/min   . Diverticulitis   . Chronic diastolic CHF (congestive heart failure)   . History of cardiovascular stress test     a. Myoview Oct 2012 showed EF 49%, no ischemia, LVE  . History of MRI 12.2015    cardiac MRI showed ejection fraction 61%, moderate left ventricular hypertrophy, large pericardial effusion and mildly reduced RV function  . Pericardial effusion     chronic; felt to be poss related to minoxidil >> DC'd  . Mitral regurgitation     a. Echo 7/16:  Mild LVH, EF 0000000, grade 2 diastolic dysfunction, MAC, at least moderate MR-very eccentric, posteriorly directed, possible prolapse of A2 segment; moderate LAE, PASP 47, small effusion      Allergies:    Allergies  Allergen Reactions  . Ace Inhibitors   . Lipitor  [Atorvastatin]     Tolerates rosuvastatin  . Minoxidil     Pericardial effusion      Procedures Performed This Admission:   None   Hospital Course:  Jody Nguyen is a 59 y.o. female with a history of malignant HTN, diastolic heart failure, stroke, DM, CKD4 , Hyperlipidemia, Vitamin D deficiency and anemia. Myoview in Oct 2012 showed EF 49%, no ischemia, LVE. Renal dopplers Nov 2013 showed no RAS. She was admitted Dec 2015 with CHF after missing Lasix; cardiac MRI showed ejection fraction 61%, moderate left ventricular hypertrophy, large pericardial effusion and mildly reduced RV function. Last echocardiogram in January 2016 showed normal LV function, grade 2 diastolic dysfunction, mildly reduced RV function. Moderate pericardial effusion. Pericardial effusion felt possibly related to minoxidil which was stopped.  Last seen by Dr. [name] in the office 04/28/15. Repeat echo 05/05/25 demonstrated EF 55-60%, mild MR, small effusion.    She presented to the emergency room on the date of admission with worsening dyspnea, orthopnea and PND. Chest x-ray in the emergency room demonstrated subsegmental left lung base atelectasis and superimposed pneumonia not excluded. She was admitted for acute on chronic diastolic CHF and placed on IV Lasix. Repeat chest x-ray demonstrated mild vascular congestion and interstitial edema (no pneumonia). D-dimer returned elevated. Initially it was felt that PE was less likely. Further workup was not pursued. Repeat  echocardiogram demonstrated EF 55-60%, moderate diastolic dysfunction, at least moderate mitral regurgitation (very eccentric, posteriorly directed). Mitral valve not fully visualized. Prolapse of A2 segment of anterior mitral valve leaflet possible. Small circumferential pericardial effusion without tamponade. Recommendation was to consider TEE to more closely assess MR. She continued to improve with IV diuresis. Weight decreased by 5 pounds from admission (194  >>189). She was - 6.35 L from admission. She was evaluated by Dr. Stanford Breed this morning. He felt that she would need an outpatient TEE arranged to fully assess mitral regurgitation. Plan follow-up with PA/NP in clinic in the next 2 weeks and Dr. Stanford Breed in 8 weeks. Of note, Dr. Stanford Breed arranged VQ scan prior to discharge to rule out pulmonary embolism given elevated d-dimer. This returned low probability for pulmonary embolism.  She was discharged home on Lasix 60 mg twice a day. She was instructed to take an additional Lasix 40 mg for weight gain of 2 pounds or more in one day.  FU creatinine today was stable.  She will need a repeat BMET 06/03/15.   Discharge Vitals:   Blood pressure 137/78, pulse 85, temperature 98.3 F (36.8 C), temperature source Oral, resp. rate 18, height 4' 11.5" (1.511 m), weight 189 lb 6 oz (85.9 kg), SpO2 91 %.   Labs:   Recent Labs  05/30/15 0255 05/31/15 0308  WBC 5.3 5.1  HGB 9.9* 10.0*  HCT 31.2* 31.0*  MCV 81.0 81.6  PLT 282 290     Recent Labs  05/29/15 0358 05/30/15 0255 05/31/15 1118  NA 140 141 137  K 3.6 3.9 3.7  CL 102 102 99*  CO2 29 29 29   BUN 31* 32* 30*  CREATININE 1.76* 1.83* 1.71*  CALCIUM 9.4 9.5 9.7    Lab Results  Component Value Date   DDIMER 2.61* 05/27/2015    B NATRIURETIC PEPTIDE  Date Value Ref Range Status  05/27/2015 205.6* 0.0 - 100.0 pg/mL Final    Diagnostic Procedures and Studies:  Dg Chest 2 View  05/31/2015      IMPRESSION: Cardiomegaly and pulmonary vascular congestion.   Electronically Signed   By: Dereck Ligas M.D.   On: 05/31/2015 10:23   Dg Chest 2 View  05/28/2015    IMPRESSION: Cardiomegaly again noted. Central mild vascular congestion. Mild interstitial prominence suspicious for mild edema.   Electronically Signed   By: Lahoma Crocker M.D.   On: 05/28/2015 08:07   Dg Chest Port 1 View  05/27/2015     IMPRESSION: Subsegmental left lung base atelectasis. No significant interval change.   Electronically  Signed   By: Anner Crete M.D.   On: 05/27/2015 15:38   VQ Scan 05/31/15  IMPRESSION:  Solitary matched segmental perfusion defect in the left upper lobe, therefore low probability of pulmonary embolism by PIOPED II criteria.   2D Echocardiogram 05/28/15: - Left ventricle: The cavity size was normal. Wall thickness wasincreased in a pattern of mild LVH. Systolic function was normal.The estimated ejection fraction was in the range of 55% to 60%.Wall motion was normal; there were no regional wall motionabnormalities. Features are consistent with a pseudonormal leftventricular filling pattern, with concomitant abnormal relaxationand increased filling pressure (grade 2 diastolic dysfunction). - Aortic valve: There was no stenosis. - Mitral valve: Mildly calcified annulus. There was at leastmoderate mitral regurgitation. MR was very eccentric,posteriorly-directed. Possible that there is some prolapse of A2segment leading to the MR but difficult to tell mechanism. - Left atrium: The atrium was moderately dilated. -  Right ventricle: The cavity size was normal. Systolic functionwas normal. - Atrial septum: The septum bowed from left to right, consistentwith increased left atrial pressure. - Tricuspid valve: Peak RV-RA gradient (S): 44 mm Hg. - Pulmonary arteries: PA peak pressure: 47 mm Hg (S). - Inferior vena cava: The vessel was normal in size. The respirophasic diameter changes were in the normal range (>= 50%),consistent with normal central venous pressure. - Pericardium, extracardiac: A small circumferential pericardialeffusion was identified, no tamponade.  Impressions:- Normal LV size with mild LV hypertrophy. EF 55-60%. Moderatediastolic dysfunction. Very eccentric, posteriorly-directedmitral regurgitation, at least moderate. Not fully visualized. Iam not definitely sure of the mechanism but may be prolapse of A2segment of anterior MV leaflet. Normal RV size and  systolicfunction. Mild pulmonary hypertension. Small circumferentialpericardial effusion without tamponade. Would consider TEE tomore closely assess the MR.   Disposition:   Pt is being discharged home today in good condition.  Follow-up Plans & Appointments      Follow-up Information    Follow up with [facility] On 06/03/2015.   Why:  Labs:  BMET (Dx chronic diastolic chf; chronic kidney disease)   Contact information:   [address] [place] [id number] [phone]      Follow up with Richardson Dopp, PA-C In 2 weeks.   Specialties:  Physician Assistant, Radiology, Interventional Cardiology   Why:  the office will call to arrange   Contact information:   [address] [address] [phone]       Follow up with Transesophageal Echocardiogram.   Why:  the office will call to arrange this test      Follow up with [name], MD In 8 weeks.   Specialty:  Cardiology   Why:  the office will call to arrange an appointment   Contact information:   [address] [place] [address] [phone]       Call Unk Pinto DAVID, MD.   Specialty:  Internal Medicine   Why:  discuss with Dr. Melford Aase whether or not to take Metformin   Contact information:   [address] [place] [address] [phone]       Discharge Medications    Medication List    STOP taking these medications        metFORMIN 500 MG 24 hr tablet  Commonly known as:  GLUCOPHAGE XR     naproxen sodium 220 MG tablet  Commonly known as:  ANAPROX      TAKE these medications        aspirin 81 MG tablet  Take 81 mg by mouth daily.     canagliflozin 100 MG Tabs tablet  Commonly known as:  INVOKANA  Take 1 tablet (100 mg total) by mouth daily.     cloNIDine 0.2 mg/24hr patch  Commonly known as:  CATAPRES - Dosed in mg/24 hr  Place 1 patch (0.2 mg total) onto the skin every 7 (seven) days.       cyclobenzaprine 10 MG tablet  Commonly known as:  FLEXERIL  Take 1 tablet (10 mg total) by mouth 2 (two) times daily as needed for muscle spasms.     furosemide 40 MG tablet  Commonly known as:  LASIX  Take 1.5 tablets (60 mg total) by mouth 2 (two) times daily.     hydrALAZINE 100 MG tablet  Commonly known as:  APRESOLINE  Take 1 tablet (100 mg total) by mouth every 8 (eight) hours.  insulin aspart protamine - aspart (70-30) 100 UNIT/ML FlexPen  Commonly known as:  NOVOLOG MIX 70/30 FLEXPEN  Inject 20 Units into the skin 2 (two) times daily.     labetalol 300 MG tablet  Commonly known as:  NORMODYNE  Take 1 tablet (300 mg total) by mouth 2 (two) times daily.     losartan 50 MG tablet  Commonly known as:  COZAAR  Take 1 tablet (50 mg total) by mouth 2 (two) times daily.     Magnesium 250 MG Tabs  Take 1 tablet by mouth daily.     potassium chloride SA 20 MEQ tablet  Commonly known as:  K-DUR,KLOR-CON  Take 1 tablet (20 mEq total) by mouth daily.     rosuvastatin 20 MG tablet  Commonly known as:  CRESTOR  Take 1 tablet (20 mg total) by mouth daily.     Vitamin D-3 5000 UNITS Tabs  Take 1 tablet by mouth daily.         Outstanding Labs/Studies  1. BMET 06/03/15 2. TEE to be scheduled as an outpatient   Duration of Discharge Encounter: Greater than 30 minutes including physician and PA time.  Signed, Richardson Dopp, PA-C   05/31/2015 12:05 PM

## 2015-05-31 NOTE — Progress Notes (Signed)
Primary cardiologist: Dr. [name]  Seen for followup: Diastolic heart failure, mitral regurgitation  Subjective:    Denies CP or dyspnea  Objective:   Temp:  [98 F (36.7 C)-98.3 F (36.8 C)] 98.3 F (36.8 C) (07/04 0415) Pulse Rate:  [82-86] 85 (07/04 0415) Resp:  [18-19] 18 (07/04 0415) BP: (133-161)/(73-82) 137/78 mmHg (07/04 0415) SpO2:  [91 %-97 %] 91 % (07/04 0415) Weight:  [189 lb 6 oz (85.9 kg)] 189 lb 6 oz (85.9 kg) (07/04 0415) Last BM Date: 05/30/15  Filed Weights   05/29/15 0406 05/30/15 0559 05/31/15 0415  Weight: 190 lb 1.6 oz (86.229 kg) 189 lb 1.6 oz (85.775 kg) 189 lb 6 oz (85.9 kg)    Intake/Output Summary (Last 24 hours) at 05/31/15 0812 Last data filed at 05/31/15 0437  Gross per 24 hour  Intake    720 ml  Output   2200 ml  Net  -1480 ml    Telemetry: Sinus rhythm.  Exam:  General: Obese, no distress.  HEENT: normal  Neck: supple  Lungs: CTA  Cardiac: RRR with 3/6 apical holosystolic murmur.  Abdomen: NT/ND, no masses  Extremities: No edema.  Lab Results:  Basic Metabolic Panel:  Recent Labs Lab 05/27/15 2017 05/28/15 0240 05/29/15 0358 05/30/15 0255  NA  --  139 140 141  K  --  3.6 3.6 3.9  CL  --  103 102 102  CO2  --  27 29 29   GLUCOSE  --  116* 148* 152*  BUN  --  33* 31* 32*  CREATININE 1.88* 1.79* 1.76* 1.83*  CALCIUM  --  9.0 9.4 9.5  MG 2.1  --   --   --     Liver Function Tests:  Recent Labs Lab 05/27/15 1455  AST 18  ALT 13*  ALKPHOS 51  BILITOT 1.2  PROT 6.8  ALBUMIN 3.7    CBC:  Recent Labs Lab 05/27/15 2017 05/30/15 0255 05/31/15 0308  WBC 6.0 5.3 5.1  HGB 10.3* 9.9* 10.0*  HCT 32.0* 31.2* 31.0*  MCV 79.8 81.0 81.6  PLT 244 282 290    Echocardiogram 05/28/2015: Study Conclusions  - Left ventricle: The cavity size was normal. Wall thickness was increased in a pattern of mild LVH. Systolic function was normal. The estimated ejection fraction was in the range of 55% to  60%. Wall motion was normal; there were no regional wall motion abnormalities. Features are consistent with a pseudonormal left ventricular filling pattern, with concomitant abnormal relaxation and increased filling pressure (grade 2 diastolic dysfunction). - Aortic valve: There was no stenosis. - Mitral valve: Mildly calcified annulus. There was at least moderate mitral regurgitation. MR was very eccentric, posteriorly-directed. Possible that there is some prolapse of A2 segment leading to the MR but difficult to tell mechanism. - Left atrium: The atrium was moderately dilated. - Right ventricle: The cavity size was normal. Systolic function was normal. - Atrial septum: The septum bowed from left to right, consistent with increased left atrial pressure. - Tricuspid valve: Peak RV-RA gradient (S): 44 mm Hg. - Pulmonary arteries: PA peak pressure: 47 mm Hg (S). - Inferior vena cava: The vessel was normal in size. The respirophasic diameter changes were in the normal range (>= 50%), consistent with normal central venous pressure. - Pericardium, extracardiac: A small circumferential pericardial effusion was identified, no tamponade.  Impressions:  - Normal LV size with mild LV hypertrophy. EF 55-60%. Moderate diastolic dysfunction. Very eccentric, posteriorly-directed mitral regurgitation, at  least moderate. Not fully visualized. I am not definitely sure of the mechanism but may be prolapse of A2 segment of anterior MV leaflet. Normal RV size and systolic function. Mild pulmonary hypertension. Small circumferential pericardial effusion without tamponade. Would consider TEE to more closely assess the MR.   Medications:   Scheduled Medications: . aspirin EC  81 mg Oral Daily  . cholecalciferol  1,000 Units Oral Daily  . cloNIDine  0.2 mg Transdermal Q7 days  . furosemide  40 mg Intravenous BID  . heparin  5,000 Units Subcutaneous 3 times per  day  . hydrALAZINE  100 mg Oral 3 times per day  . insulin aspart  0-15 Units Subcutaneous TID WC  . labetalol  300 mg Oral BID  . losartan  50 mg Oral BID  . magnesium oxide  200 mg Oral Daily  . potassium chloride SA  20 mEq Oral Daily  . rosuvastatin  20 mg Oral q1800  . sodium chloride  3 mL Intravenous Q12H    PRN Medications: sodium chloride, acetaminophen, cyclobenzaprine, ondansetron (ZOFRAN) IV, sodium chloride   Assessment:   1. Acute on chronic diastolic heart failure complicated by what looks to be significant mitral regurgitation by follow-up echocardiogram. Improved with diuresis  2. Small circumferential pericardial effusion, stable.  3. At least moderate mitral regurgitation, eccentric. PASP 47 mmHg.  4. CKD, stage 3. Creatinine 1.8.  5. Elevated D dimer  Plan/Discussion:    Improved with diuresis; plan FU BMET today; if renal function stable, DC on lasix 60 mg PO BID; pt educated on low NA diet; pt to weigh daily and take additional 40 lasix for weight gain of 2 lbs. Check BMET Thursday 7/7. Follow BP at home and add additional meds as needed. Schedule outpt TEE to assess MR. FU with APP 2 weeks and me 8 weeks. Note Ddimer elevated on admission; check VQ prior to DC. > 30 min PA and physician time D2   [name], M.D., F.A.C.C.

## 2015-05-31 NOTE — Telephone Encounter (Signed)
Patient DC from [facility] 05/31/2015. She needs an outpatient TEE arranged to assess mitral regurgitation. Orders in chart. Please call patient to arrange. Arrange with Dr. [name] if possible. Richardson Dopp, PA-C   05/31/2015 11:35 AM

## 2015-05-31 NOTE — Progress Notes (Signed)
SATURATION QUALIFICATIONS: (This note is used to comply with regulatory documentation for home oxygen)  Patient Saturations on Room Air at Rest = 95%  Patient Saturations on Room Air while Ambulating = 90%  Patient Saturations on 1 Liters of oxygen while Ambulating = 94%  Please briefly explain why patient needs home oxygen:

## 2015-06-01 ENCOUNTER — Telehealth: Payer: Self-pay | Admitting: Physician Assistant

## 2015-06-01 NOTE — Telephone Encounter (Signed)
TCM w/ Nicki Reaper 7/18 @ 10am, per Scott/Staff mess

## 2015-06-02 ENCOUNTER — Other Ambulatory Visit: Payer: Self-pay

## 2015-06-02 ENCOUNTER — Encounter: Payer: Self-pay | Admitting: *Deleted

## 2015-06-02 MED ORDER — CANAGLIFLOZIN 100 MG PO TABS: 100.0000 mg | ORAL_TABLET | Freq: Every day | ORAL | Status: DC

## 2015-06-02 NOTE — Telephone Encounter (Signed)
Spoke with pt. She is available for TEE the morning of June 08, 2015.  She is unable to go over instructions at this time as she is out in the community.  Will schedule.  Pt will call back later today when she is able to talk. TEE scheduled with Dr. Stanford Breed for June 08, 2015 at 8:00.  Pt to arrive at 6:30.  Will write instruction letter and go over with pt when she calls back.  Pt is scheduled for BMP on June 03, 2015.

## 2015-06-02 NOTE — Telephone Encounter (Signed)
Spoke with pt and verbally went over TEE instructions with her.  She verbalizes understanding.  I told pt I would leave printed copy of instructions at front desk for her to pick up when here for lab work on June 03, 2015.

## 2015-06-02 NOTE — Telephone Encounter (Signed)
Patient contacted regarding discharge from [facility] on May 31, 2015  Patient understands to follow up with provider yes--Scott Kathlen Mody, [place] on July 18,2016 at 10:00.  Pt also aware of lab work on June 03, 2015. Pt aware both of these appointments are at [facility] office  Patient understands discharge instructions? yes Patient understands medications and regiment? yes Patient understands to bring all medications to this visit? yes

## 2015-06-02 NOTE — Telephone Encounter (Signed)
Spoke with pt. She is out in the community and not able to answer many questions at this time. She is available for TEE the morning of June 08, 2015.  Will schedule.  She will call our office back when she is able to talk.  (see phone note dated 7/4 for TEE information)

## 2015-06-03 ENCOUNTER — Telehealth: Payer: Self-pay | Admitting: *Deleted

## 2015-06-03 ENCOUNTER — Other Ambulatory Visit (INDEPENDENT_AMBULATORY_CARE_PROVIDER_SITE_OTHER): Payer: BLUE CROSS/BLUE SHIELD | Admitting: *Deleted

## 2015-06-03 DIAGNOSIS — I5032 Chronic diastolic (congestive) heart failure: Secondary | ICD-10-CM

## 2015-06-03 DIAGNOSIS — N184 Chronic kidney disease, stage 4 (severe): Secondary | ICD-10-CM | POA: Diagnosis not present

## 2015-06-03 LAB — BASIC METABOLIC PANEL
BUN: 42 mg/dL — ABNORMAL HIGH (ref 6–23)
CO2: 27 mEq/L (ref 19–32)
Calcium: 9.9 mg/dL (ref 8.4–10.5)
Chloride: 101 mEq/L (ref 96–112)
Creatinine, Ser: 1.86 mg/dL — ABNORMAL HIGH (ref 0.40–1.20)
GFR: 35.51 mL/min — ABNORMAL LOW (ref >60.00)
Glucose, Bld: 147 mg/dL — ABNORMAL HIGH (ref 70–99)
Potassium: 3.4 mEq/L — ABNORMAL LOW (ref 3.5–5.1)
Sodium: 138 mEq/L (ref 135–145)

## 2015-06-03 NOTE — Telephone Encounter (Signed)
Patient called and states she was in the hospital for shortness of breath and they stopped her Metformin.  OK to restart the Metformin per Dr Melford Aase.  Patient aware.

## 2015-06-03 NOTE — Addendum Note (Signed)
Addended by: Eulis Foster on: 06/03/2015 07:51 AM   Modules accepted: Orders

## 2015-06-04 ENCOUNTER — Telehealth: Payer: Self-pay | Admitting: *Deleted

## 2015-06-04 ENCOUNTER — Other Ambulatory Visit: Payer: Self-pay | Admitting: Internal Medicine

## 2015-06-04 DIAGNOSIS — Z79899 Other long term (current) drug therapy: Secondary | ICD-10-CM

## 2015-06-04 MED ORDER — FUROSEMIDE 40 MG PO TABS: 40.0000 mg | ORAL_TABLET | Freq: Two times a day (BID) | ORAL | Status: DC

## 2015-06-04 NOTE — Telephone Encounter (Signed)
Patient notified of lab results and to decrease Lasix to 40mg  twice daily and recheck bmet in one week 06/14/15.  Patient voiced understanding.

## 2015-06-04 NOTE — Telephone Encounter (Signed)
-----   Message from Lelon Perla, MD sent at 06/03/2015 10:35 AM EDT ----- Change lasix to 40 mg BID; bmet one week [name]

## 2015-06-08 ENCOUNTER — Ambulatory Visit (HOSPITAL_BASED_OUTPATIENT_CLINIC_OR_DEPARTMENT_OTHER)
Admission: RE | Admit: 2015-06-08 | Discharge: 2015-06-08 | Disposition: A | Discharge status: Home / Self Care | Payer: BLUE CROSS/BLUE SHIELD | Source: Ambulatory Visit | Attending: Cardiology | Admitting: Cardiology

## 2015-06-08 ENCOUNTER — Encounter (HOSPITAL_COMMUNITY)
Admission: RE | Disposition: A | Discharge status: Home / Self Care | Payer: Self-pay | Source: Ambulatory Visit | Attending: Cardiology

## 2015-06-08 ENCOUNTER — Ambulatory Visit (HOSPITAL_COMMUNITY)
Admission: RE | Admit: 2015-06-08 | Discharge: 2015-06-08 | Disposition: A | Discharge status: Home / Self Care | Payer: BLUE CROSS/BLUE SHIELD | Source: Ambulatory Visit | Attending: Cardiology | Admitting: Cardiology

## 2015-06-08 ENCOUNTER — Encounter (HOSPITAL_COMMUNITY): Payer: Self-pay | Admitting: *Deleted

## 2015-06-08 DIAGNOSIS — E119 Type 2 diabetes mellitus without complications: Secondary | ICD-10-CM | POA: Insufficient documentation

## 2015-06-08 DIAGNOSIS — D631 Anemia in chronic kidney disease: Secondary | ICD-10-CM | POA: Diagnosis not present

## 2015-06-08 DIAGNOSIS — I129 Hypertensive chronic kidney disease with stage 1 through stage 4 chronic kidney disease, or unspecified chronic kidney disease: Secondary | ICD-10-CM | POA: Diagnosis not present

## 2015-06-08 DIAGNOSIS — I313 Pericardial effusion (noninflammatory): Secondary | ICD-10-CM | POA: Insufficient documentation

## 2015-06-08 DIAGNOSIS — I5033 Acute on chronic diastolic (congestive) heart failure: Secondary | ICD-10-CM | POA: Diagnosis present

## 2015-06-08 DIAGNOSIS — E785 Hyperlipidemia, unspecified: Secondary | ICD-10-CM | POA: Diagnosis not present

## 2015-06-08 DIAGNOSIS — N184 Chronic kidney disease, stage 4 (severe): Secondary | ICD-10-CM | POA: Insufficient documentation

## 2015-06-08 DIAGNOSIS — I340 Nonrheumatic mitral (valve) insufficiency: Secondary | ICD-10-CM | POA: Insufficient documentation

## 2015-06-08 DIAGNOSIS — Z8673 Personal history of transient ischemic attack (TIA), and cerebral infarction without residual deficits: Secondary | ICD-10-CM | POA: Insufficient documentation

## 2015-06-08 DIAGNOSIS — Z794 Long term (current) use of insulin: Secondary | ICD-10-CM | POA: Diagnosis not present

## 2015-06-08 HISTORY — PX: TEE WITHOUT CARDIOVERSION: SHX5443

## 2015-06-08 LAB — GLUCOSE, CAPILLARY: Glucose-Capillary: 136 mg/dL — ABNORMAL HIGH (ref 65–99)

## 2015-06-08 SURGERY — ECHOCARDIOGRAM, TRANSESOPHAGEAL
Anesthesia: Moderate Sedation

## 2015-06-08 MED ORDER — DIPHENHYDRAMINE HCL 50 MG/ML IJ SOLN
INTRAMUSCULAR | Status: AC
  Filled 2015-06-08: qty 1

## 2015-06-08 MED ORDER — MIDAZOLAM HCL 10 MG/2ML IJ SOLN
INTRAMUSCULAR | Status: DC | PRN
  Administered 2015-06-08: 2 mg via INTRAVENOUS
  Administered 2015-06-08 (×2): 1 mg via INTRAVENOUS

## 2015-06-08 MED ORDER — SODIUM CHLORIDE 0.9 % IV SOLN
INTRAVENOUS | Status: DC
  Administered 2015-06-08: 08:00:00 via INTRAVENOUS

## 2015-06-08 MED ORDER — FENTANYL CITRATE (PF) 100 MCG/2ML IJ SOLN
INTRAMUSCULAR | Status: AC
  Filled 2015-06-08: qty 2

## 2015-06-08 MED ORDER — LIDOCAINE VISCOUS 2 % MT SOLN
OROMUCOSAL | Status: AC
  Filled 2015-06-08: qty 15

## 2015-06-08 MED ORDER — FENTANYL CITRATE (PF) 100 MCG/2ML IJ SOLN
INTRAMUSCULAR | Status: DC | PRN
  Administered 2015-06-08 (×2): 25 ug via INTRAVENOUS

## 2015-06-08 MED ORDER — BUTAMBEN-TETRACAINE-BENZOCAINE 2-2-14 % EX AERO
INHALATION_SPRAY | CUTANEOUS | Status: DC | PRN
  Administered 2015-06-08: 2 via TOPICAL

## 2015-06-08 MED ORDER — MIDAZOLAM HCL 5 MG/ML IJ SOLN
INTRAMUSCULAR | Status: AC
  Filled 2015-06-08: qty 2

## 2015-06-08 NOTE — H&P (Signed)
[name] Physician Assistant Attested Cardiology Discharge Summaries 05/31/2015 10:34 AM  Related encounter: ED to Hosp-Admission (Discharged) from 05/27/2015 in [place] signed by Lelon Perla, MD at 05/31/2015 7:27 PM  See progress notes [name]     Expand All Collapse All      Discharge Summary    Patient ID: Jody Nguyen, MRN: [id number], DOB/AGE: July 17, 1956 59 y.o.  Admit date: 05/27/2015 Discharge date: 05/31/2015  Primary Care Physician: [facility] DAV[facility]  Primary Cardiologist: Dr. [name]    Reason for Admission: Acute on Chronic Diastolic CHF   Primary Discharge Diagnoses:  Principal Problem:  Acute on chronic diastolic CHF (congestive heart failure) Active Problems:  Moderate mitral regurgitation  Hypertension  Hyperlipidemia  Anemia in chronic kidney disease  Pericardial effusion  IDDM (insulin dependent diabetes mellitus)  CKD (chronic kidney disease) stage 4, GFR 15-29 ml/min  Elevated d-dimer    Wt Readings from Last 3 Encounters:  05/31/15 189 lb 6 oz (85.9 kg)  04/30/15 191 lb 4.8 oz (86.773 kg)  03/29/15 184 lb 9.6 oz (83.734 kg)    Secondary Discharge Diagnoses:   Past Medical History  Diagnosis Date  . Obesity   . HLD (hyperlipidemia)   . Vitamin D deficiency   . DM (diabetes mellitus)   . Hypertension     malignant; Renal Art [place] 11/13: no RAS  . CVA (cerebral infarction) 1997    no residual deficit  . Anemia   . CKD (chronic kidney disease) stage 4, GFR 15-29 ml/min   . Diverticulitis   . Chronic diastolic CHF (congestive heart failure)   . History of cardiovascular stress test     a. Myoview Oct 2012 showed EF 49%, no ischemia, LVE  . History of MRI 12.2015    cardiac MRI showed ejection fraction 61%, moderate left ventricular hypertrophy, large pericardial effusion and mildly reduced RV  function  . Pericardial effusion     chronic; felt to be poss related to minoxidil >> DC'd  . Mitral regurgitation     a. Echo 7/16: Mild LVH, EF 0000000, grade 2 diastolic dysfunction, MAC, at least moderate MR-very eccentric, posteriorly directed, possible prolapse of A2 segment; moderate LAE, PASP 47, small effusion      Allergies:  Allergies  Allergen Reactions  . Ace Inhibitors   . Lipitor [Atorvastatin]     Tolerates rosuvastatin  . Minoxidil     Pericardial effusion      Procedures Performed This Admission:  None   Hospital Course:  Jody Nguyen is a 59 y.o. female with a history of malignant HTN, diastolic heart failure, stroke, DM, CKD4 , Hyperlipidemia, Vitamin D deficiency and anemia. Myoview in Oct 2012 showed EF 49%, no ischemia, LVE. Renal dopplers Nov 2013 showed no RAS. She was admitted Dec 2015 with CHF after missing Lasix; cardiac MRI showed ejection fraction 61%, moderate left ventricular hypertrophy, large pericardial effusion and mildly reduced RV function. Last echocardiogram in January 2016 showed normal LV function, grade 2 diastolic dysfunction, mildly reduced RV function. Moderate pericardial effusion. Pericardial effusion felt possibly related to minoxidil which was stopped. Last seen by Dr. [name] in the office 04/28/15. Repeat echo 05/05/25 demonstrated EF 55-60%, mild MR, small effusion.   She presented to the emergency room on the date of admission with worsening dyspnea, orthopnea and PND. Chest x-ray in the emergency room demonstrated subsegmental left lung base atelectasis and superimposed pneumonia not excluded. She was admitted  for acute on chronic diastolic CHF and placed on IV Lasix. Repeat chest x-ray demonstrated mild vascular congestion and interstitial edema (no pneumonia). D-dimer returned elevated. Initially it was felt that PE was less likely. Further workup was not pursued. Repeat echocardiogram  demonstrated EF 55-60%, moderate diastolic dysfunction, at least moderate mitral regurgitation (very eccentric, posteriorly directed). Mitral valve not fully visualized. Prolapse of A2 segment of anterior mitral valve leaflet possible. Small circumferential pericardial effusion without tamponade. Recommendation was to consider TEE to more closely assess MR. She continued to improve with IV diuresis. Weight decreased by 5 pounds from admission (194 >>189). She was - 6.35 L from admission. She was evaluated by Dr. Stanford Breed this morning. He felt that she would need an outpatient TEE arranged to fully assess mitral regurgitation. Plan follow-up with PA/NP in clinic in the next 2 weeks and Dr. Stanford Breed in 8 weeks. Of note, Dr. Stanford Breed arranged VQ scan prior to discharge to rule out pulmonary embolism given elevated d-dimer. This returned low probability for pulmonary embolism. She was discharged home on Lasix 60 mg twice a day. She was instructed to take an additional Lasix 40 mg for weight gain of 2 pounds or more in one day. FU creatinine today was stable. She will need a repeat BMET 06/03/15.   Discharge Vitals: Blood pressure 137/78, pulse 85, temperature 98.3 F (36.8 C), temperature source Oral, resp. rate 18, height 4' 11.5" (1.511 m), weight 189 lb 6 oz (85.9 kg), SpO2 91 %.   Labs:   Recent Labs (last 2 labs)      Recent Labs  05/30/15 0255 05/31/15 0308  WBC 5.3 5.1  HGB 9.9* 10.0*  HCT 31.2* 31.0*  MCV 81.0 81.6  PLT 282 290       Recent Labs (last 2 labs)      Recent Labs  05/29/15 0358 05/30/15 0255 05/31/15 1118  NA 140 141 137  K 3.6 3.9 3.7  CL 102 102 99*  CO2 29 29 29   BUN 31* 32* 30*  CREATININE 1.76* 1.83* 1.71*  CALCIUM 9.4 9.5 9.7       Recent Labs    Lab Results  Component Value Date   DDIMER 2.61* 05/27/2015       Last Labs    B NATRIURETIC PEPTIDE  Date Value Ref Range Status    05/27/2015 205.6* 0.0 - 100.0 pg/mL Final      Diagnostic Procedures and Studies:  Dg Chest 2 View 05/31/2015 IMPRESSION: Cardiomegaly and pulmonary vascular congestion. Electronically Signed By: Dereck Ligas M.D. On: 05/31/2015 10:23   Dg Chest 2 View 05/28/2015 IMPRESSION: Cardiomegaly again noted. Central mild vascular congestion. Mild interstitial prominence suspicious for mild edema. Electronically Signed By: Lahoma Crocker M.D. On: 05/28/2015 08:07   Dg Chest Port 1 View 05/27/2015 IMPRESSION: Subsegmental left lung base atelectasis. No significant interval change. Electronically Signed By: Anner Crete M.D. On: 05/27/2015 15:38   VQ Scan 05/31/15 IMPRESSION: Solitary matched segmental perfusion defect in the left upper lobe, therefore low probability of pulmonary embolism by PIOPED II criteria.   2D Echocardiogram 05/28/15: - Left ventricle: The cavity size was normal. Wall thickness wasincreased in a pattern of mild LVH. Systolic function was normal.The estimated ejection fraction was in the range of 55% to 60%.Wall motion was normal; there were no regional wall motionabnormalities. Features are consistent with a pseudonormal leftventricular filling pattern, with concomitant abnormal relaxationand increased filling pressure (grade 2 diastolic dysfunction). - Aortic valve: There was no stenosis. - Mitral valve:  Mildly calcified annulus. There was at leastmoderate mitral regurgitation. MR was very eccentric,posteriorly-directed. Possible that there is some prolapse of A2segment leading to the MR but difficult to tell mechanism. - Left atrium: The atrium was moderately dilated. - Right ventricle: The cavity size was normal. Systolic functionwas normal. - Atrial septum: The septum bowed from left to right, consistentwith increased left atrial pressure. - Tricuspid valve: Peak RV-RA gradient (S): 44 mm Hg. - Pulmonary arteries: PA peak  pressure: 47 mm Hg (S). - Inferior vena cava: The vessel was normal in size. The respirophasic diameter changes were in the normal range (>= 50%),consistent with normal central venous pressure. - Pericardium, extracardiac: A small circumferential pericardialeffusion was identified, no tamponade.  Impressions:- Normal LV size with mild LV hypertrophy. EF 55-60%. Moderatediastolic dysfunction. Very eccentric, posteriorly-directedmitral regurgitation, at least moderate. Not fully visualized. Iam not definitely sure of the mechanism but may be prolapse of A2segment of anterior MV leaflet. Normal RV size and systolicfunction. Mild pulmonary hypertension. Small circumferentialpericardial effusion without tamponade. Would consider TEE tomore closely assess the MR.   Disposition: Pt is being discharged home today in good condition.  Follow-up Plans & Appointments      Follow-up Information    Follow up with [facility] On 06/03/2015.   Why: Labs: BMET (Dx chronic diastolic chf; chronic kidney disease)   Contact information:   [address] [place] [id number] [phone]      Follow up with Richardson Dopp, PA-C In 2 weeks.   Specialties: Physician Assistant, Radiology, Interventional Cardiology   Why: the office will call to arrange   Contact information:   [address] [address] [phone]       Follow up with Transesophageal Echocardiogram.   Why: the office will call to arrange this test      Follow up with [name], MD In 8 weeks.   Specialty: Cardiology   Why: the office will call to arrange an appointment   Contact information:   [address] [place] [address] [phone]       Call Unk Pinto DAVID, MD.   Specialty: Internal Medicine   Why: discuss with Dr. Melford Aase whether or not to take Metformin   Contact  information:   [address] [place] [address] [phone]       Discharge Medications    Medication List    STOP taking these medications       metFORMIN 500 MG 24 hr tablet  Commonly known as: GLUCOPHAGE XR     naproxen sodium 220 MG tablet  Commonly known as: ANAPROX      TAKE these medications       aspirin 81 MG tablet  Take 81 mg by mouth daily.     canagliflozin 100 MG Tabs tablet  Commonly known as: INVOKANA  Take 1 tablet (100 mg total) by mouth daily.     cloNIDine 0.2 mg/24hr patch  Commonly known as: CATAPRES - Dosed in mg/24 hr  Place 1 patch (0.2 mg total) onto the skin every 7 (seven) days.     cyclobenzaprine 10 MG tablet  Commonly known as: FLEXERIL  Take 1 tablet (10 mg total) by mouth 2 (two) times daily as needed for muscle spasms.     furosemide 40 MG tablet  Commonly known as: LASIX  Take 1.5 tablets (60 mg total) by mouth 2 (two) times daily.     hydrALAZINE 100 MG tablet  Commonly known as: APRESOLINE  Take 1 tablet (100 mg total) by mouth every 8 (eight) hours.     insulin aspart protamine - aspart (70-30) 100 UNIT/ML FlexPen  Commonly known as: NOVOLOG MIX 70/30 FLEXPEN  Inject 20 Units into the skin 2 (two) times daily.     labetalol 300 MG tablet  Commonly known as: NORMODYNE  Take 1 tablet (300 mg total) by mouth 2 (two) times daily.     losartan 50 MG tablet  Commonly known as: COZAAR  Take 1 tablet (50 mg total) by mouth 2 (two) times daily.     Magnesium 250 MG Tabs  Take 1 tablet by mouth daily.     potassium chloride SA 20 MEQ tablet  Commonly known as: K-DUR,KLOR-CON  Take 1 tablet (20 mEq total) by mouth daily.     rosuvastatin 20 MG tablet  Commonly known as: CRESTOR  Take 1 tablet (20 mg total) by mouth daily.     Vitamin D-3 5000 UNITS Tabs  Take 1 tablet by mouth daily.          Outstanding Labs/Studies  1. BMET 06/03/15 2. TEE to be scheduled as an outpatient   Duration of Discharge Encounter: Greater than 30 minutes including physician and PA time.  Signed, Richardson Dopp, PA-C  05/31/2015 12:05 PM       Cosigned by: Lelon Perla, MD at 05/31/2015 7:27 PM   For TEE to assess MR/MV morphology; no changes. [name]

## 2015-06-08 NOTE — Interval H&P Note (Signed)
History and Physical Interval Note:  06/08/2015 7:41 AM  Jody Nguyen  has presented today for surgery, with the diagnosis of VITRA REGURGITATION  The various methods of treatment have been discussed with the patient and family. After consideration of risks, benefits and other options for treatment, the patient has consented to  Procedure(s): TRANSESOPHAGEAL ECHOCARDIOGRAM (TEE) (N/A) as a surgical intervention .  The patient's history has been reviewed, patient examined, no change in status, stable for surgery.  I have reviewed the patient's chart and labs.  Questions were answered to the patient's satisfaction.     [name]

## 2015-06-08 NOTE — CV Procedure (Signed)
See full TEE report in [facility]; normal LV function; prolapse anterior MV leaflet with severe eccentric MR. [name]

## 2015-06-08 NOTE — Progress Notes (Signed)
  Echocardiogram Echocardiogram Transesophageal has been performed.  Jody Nguyen 06/08/2015, 8:38 AM

## 2015-06-08 NOTE — Discharge Instructions (Signed)
Conscious Sedation, Adult, Care After °Refer to this sheet in the next few weeks. These instructions provide you with information on caring for yourself after your procedure. Your health care provider may also give you more specific instructions. Your treatment has been planned according to current medical practices, but problems sometimes occur. Call your health care provider if you have any problems or questions after your procedure. °WHAT TO EXPECT AFTER THE PROCEDURE  °After your procedure: °· You may feel sleepy, clumsy, and have poor balance for several hours. °· Vomiting may occur if you eat too soon after the procedure. °HOME CARE INSTRUCTIONS °· Do not participate in any activities where you could become injured for at least 24 hours. Do not: °¨ Drive. °¨ Swim. °¨ Ride a bicycle. °¨ Operate heavy machinery. °¨ Cook. °¨ Use power tools. °¨ Climb ladders. °¨ Work from a high place. °· Do not make important decisions or sign legal documents until you are improved. °· If you vomit, drink water, juice, or soup when you can drink without vomiting. Make sure you have little or no nausea before eating solid foods. °· Only take over-the-counter or prescription medicines for pain, discomfort, or fever as directed by your health care provider. °· Make sure you and your family fully understand everything about the medicines given to you, including what side effects may occur. °· You should not drink alcohol, take sleeping pills, or take medicines that cause drowsiness for at least 24 hours. °· If you smoke, do not smoke without supervision. °· If you are feeling better, you may resume normal activities 24 hours after you were sedated. °· Keep all appointments with your health care provider. °SEEK MEDICAL CARE IF: °· Your skin is pale or bluish in color. °· You continue to feel nauseous or vomit. °· Your pain is getting worse and is not helped by medicine. °· You have bleeding or swelling. °· You are still sleepy or  feeling clumsy after 24 hours. °SEEK IMMEDIATE MEDICAL CARE IF: °· You develop a rash. °· You have difficulty breathing. °· You develop any type of allergic problem. °· You have a fever. °MAKE SURE YOU: °· Understand these instructions. °· Will watch your condition. °· Will get help right away if you are not doing well or get worse. °Document Released: 09/03/2013 Document Reviewed: 09/03/2013 °[employer]® Patient Information ©2015 [employer]. This information is not intended to replace advice given to you by your health care provider. Make sure you discuss any questions you have with your health care provider. °  °

## 2015-06-09 ENCOUNTER — Encounter (HOSPITAL_COMMUNITY): Payer: Self-pay | Admitting: Cardiology

## 2015-06-11 ENCOUNTER — Other Ambulatory Visit: Payer: Self-pay | Admitting: Internal Medicine

## 2015-06-11 DIAGNOSIS — F411 Generalized anxiety disorder: Secondary | ICD-10-CM

## 2015-06-11 MED ORDER — HYDRALAZINE HCL 100 MG PO TABS: 100.0000 mg | ORAL_TABLET | Freq: Three times a day (TID) | ORAL | Status: DC

## 2015-06-13 NOTE — Progress Notes (Signed)
Cardiology Office Note   Date:  06/14/2015   ID:  [name], DOB 19-Apr-1956, MRN [id number]  PCP:  Alesia Richards, MD  Cardiologist:  Dr. [name]     Chief Complaint  Patient presents with  . Hospitalization Follow-up  . Congestive Heart Failure  . Mitral Regurgitation     History of Present Illness: Jody Nguyen is a 59 y.o. female with a hx of malignant HTN, diastolic heart failure, stroke, DM, CKD4 , Hyperlipidemia, Vitamin D deficiency and anemia. Myoview in Oct 2012 showed EF 49%, no ischemia, LVE. Renal dopplers Nov 2013 showed no RAS. She was admitted Dec 2015 with CHF after missing Lasix; cardiac MRI showed ejection fraction 61%, moderate left ventricular hypertrophy, large pericardial effusion and mildly reduced RV function. Last echocardiogram in January 2016 showed normal LV function, grade 2 diastolic dysfunction, mildly reduced RV function. Moderate pericardial effusion. Pericardial effusion felt possibly related to minoxidil which was stopped. Last seen by Dr. [name] in the office 04/28/15. Repeat echo 05/06/15 demonstrated EF 55-60%, mild MR, small effusion.   Admitted [id number] with a/c diastolic HF.  She was diuresed with IV Lasix.  DDimer was elevated and VQ scan was low probability for PE.  Repeat echocardiogram demonstrated EF 55-60%, moderate diastolic dysfunction, at least moderate mitral regurgitation (very eccentric, posteriorly directed). Mitral valve not fully visualized. Prolapse of A2 segment of anterior mitral valve leaflet possible. Small circumferential pericardial effusion without tamponade. Recommendation was to consider TEE to more closely assess MR.   TEE was done 06/08/15.  This demonstrated normal LVF, mod to severe LAE, LVH, mildly reduced RVF, small pericardial effusion, flail segment of anterior MV leaflet (A2) with severe eccentric MR, mild TR.  I spoke to Dr. [name] last week.  The patient needs to be set up with  a R/L heart cath.  She will then need to be set up to see Dr. Roxy Manns for consideration of MV repair vs replacement.    She returns for FU.  She is here alone today. She has been doing ok since her TEE. She denies any worsening DOE. She sleeps on 2 pillows chronically without change. No PND.  She did wake up once since DC with chest tightness.  She was able to quickly go back to sleep.  She denies syncope.  Weights have been stable. BP at home stable.  Highest BP at home 140/90.     Studies/Reports Reviewed Today:  TEE 06/08/15 - Left ventricle: Hypertrophy was noted.  EF 55%to 60%. Wall motion was normal - Aortic valve: No evidence of vegetation. - Mitral valve: Moderate flail motion involving the middle segmentof the anterior leaflet due to rupture of one or more chords.There was severe regurgitation directed eccentrically andposteriorly. - Left atrium: The atrium was moderately to severely dilated. - Right ventricle: Systolic function was mildly reduced. - Right atrium: No evidence of thrombus in the atrial cavity orappendage. - Atrial septum: No defect or patent foramen ovale was identified. - Tricuspid valve: No evidence of vegetation. - Pulmonic valve: No evidence of vegetation. - Pericardium, extracardiac: A small pericardial effusion wasidentified. Impressions:  - Normal LV function; LVH; moderate to severe LAE; mildly reduced RV function; small pericardial effusion; flail segment of anterior MV leaflet (A2) with severe eccentric MR; mild TR.  Echo 05/28/15 - Left ventricle:  Mild LVH.  EF 55% to 60%.Wall motion was normal.  Grade 2 diastolic dysfunction). - Aortic valve: There was no stenosis. - Mitral valve: Mildly  calcified annulus. There was at leastmoderate mitral regurgitation. MR was very eccentric,posteriorly-directed. Possible that there is some prolapse of A2segment leading to the MR but difficult to tell mechanism. - Left atrium: The atrium was moderately dilated. -  Right ventricle: The cavity size was normal. Systolic functionwas normal. - Atrial septum: The septum bowed from left to right, consistentwith increased left atrial pressure. - Tricuspid valve: Peak RV-RA gradient (S): 44 mm Hg. - Pulmonary arteries: PA peak pressure: 47 mm Hg (S). - Inferior vena cava: The vessel was normal in size. The respirophasic diameter changes were in the normal range (>= 50%),consistent with normal central venous pressure. - Pericardium, extracardiac: A small circumferential pericardialeffusion was identified, no tamponade. Impressions:- Normal LV size with mild LV hypertrophy. EF 55-60%. Moderatediastolic dysfunction. Very eccentric, posteriorly-directedmitral regurgitation, at least moderate. Not fully visualized. Iam not definitely sure of the mechanism but may be prolapse of A2segment of anterior MV leaflet. Normal RV size and systolicfunction. Mild pulmonary hypertension. Small circumferentialpericardial effusion without tamponade. Would consider TEE tomore closely assess the MR.  Cardiac MRI 11/24/14 IMPRESSION: 1. Large, free-flowing pericardial effusion without evidence for tamponade. 2. Moderate concentric LVH without evidence for asymmetry or SAM to suggest hypertrophic cardiomyopathy. EF 61%, normal. 3. Mildly dilated RV with mildly decreased systolic function. 4. There was a subtle, patchy delayed enhancement pattern in the walls noted above that did not appear to be in a coronary disease pattern. It was not characteristic of amyloidosis, which tends to be more extensive and we did not have trouble nulling the myocardium as you often see with amyloidosis. Patchy delayed enhancement can be seen with severe hypertensive heart disease.  Myoview 08/2011 Low risk stress nuclear study. There is no evidence of ischemia. The LV is dilated but the LV function is normal. EF = 49%.  Past Medical History  Diagnosis Date  . Obesity   . HLD  (hyperlipidemia)   . Vitamin D deficiency   . DM (diabetes mellitus)   . Hypertension     malignant; Renal Art [place] 11/13: no RAS  . CVA (cerebral infarction) 1997    no residual deficit  . Anemia   . CKD (chronic kidney disease) stage 4, GFR 15-29 ml/min   . Diverticulitis   . Chronic diastolic CHF (congestive heart failure)   . History of cardiovascular stress test     a. Myoview Oct 2012 showed EF 49%, no ischemia, LVE  . History of MRI 12.2015    cardiac MRI showed ejection fraction 61%, moderate left ventricular hypertrophy, large pericardial effusion and mildly reduced RV function  . Pericardial effusion     chronic; felt to be poss related to minoxidil >> DC'd  . Mitral regurgitation     a. Echo 7/16:  Mild LVH, EF 0000000, grade 2 diastolic dysfunction, MAC, at least moderate MR-very eccentric, posteriorly directed, possible prolapse of A2 segment; moderate LAE, PASP 47, small effusion    Past Surgical History  Procedure Laterality Date  . Cesarean section      x 2  . Tee without cardioversion N/A 06/08/2015    Procedure: TRANSESOPHAGEAL ECHOCARDIOGRAM (TEE);  Surgeon: Lelon Perla, MD;  Location[facility] ENDOSCOPY;  Service: Cardiovascular;  Laterality: N/A;     Current Outpatient Prescriptions  Medication Sig Dispense Refill  . aspirin 81 MG tablet Take 81 mg by mouth daily.      . canagliflozin (INVOKANA) 100 MG TABS tablet Take 1 tablet (100 mg total) by mouth daily. 90 tablet 3  .  Cholecalciferol (VITAMIN D-3) 5000 UNITS TABS Take 1 tablet by mouth daily.    . cloNIDine (CATAPRES - DOSED IN MG/24 HR) 0.2 mg/24hr patch Place 1 patch (0.2 mg total) onto the skin every 7 (seven) days. 4 patch 2  . CRESTOR 20 MG tablet TAKE 1 BY MOUTH DAILY 90 tablet 1  . cyclobenzaprine (FLEXERIL) 10 MG tablet Take 1 tablet (10 mg total) by mouth 2 (two) times daily as needed for muscle spasms. 20 tablet 0  . furosemide (LASIX) 40 MG tablet Take 40 mg by mouth 2 (two) times daily.  5  .  hydrALAZINE (APRESOLINE) 100 MG tablet Take 1 tablet (100 mg total) by mouth every 8 (eight) hours. 90 tablet 0  . Insulin Aspart Prot & Aspart (NOVOLOG MIX 70/30 FLEXPEN) (70-30) 100 UNIT/ML Pen Inject 20 Units into the skin 2 (two) times daily. 15 mL 11  . labetalol (NORMODYNE) 200 MG tablet Take 200 mg by mouth 2 (two) times daily.    Marland Kitchen losartan (COZAAR) 50 MG tablet Take 1 tablet (50 mg total) by mouth 2 (two) times daily. 180 tablet 0  . Magnesium 250 MG TABS Take 1 tablet by mouth daily.    . metFORMIN (GLUCOPHAGE-XR) 500 MG 24 hr tablet Take 500 mg by mouth daily.  2  . potassium chloride SA (K-DUR,KLOR-CON) 20 MEQ tablet Take 1 tablet (20 mEq total) by mouth daily. 90 tablet 2   No current facility-administered medications for this visit.    Allergies:   Minoxidil; Lipitor; and Ace inhibitors    Social History:  The patient  reports that she has never smoked. She has never used smokeless tobacco. She reports that she does not drink alcohol or use illicit drugs.   Family History:  The patient's family history includes Breast cancer in her sister; Cancer in her sister; Heart attack in her brother; Stroke in her mother.    ROS:   Please see the history of present illness.   Review of Systems  Cardiovascular: Positive for chest pain.  All other systems reviewed and are negative.     PHYSICAL EXAM: VS:  BP 140/90 mmHg  Pulse 81  Ht 4' 11.5" (1.511 m)  Wt 186 lb 12.8 oz (84.732 kg)  BMI 37.11 kg/m2    Wt Readings from Last 3 Encounters:  06/14/15 186 lb 12.8 oz (84.732 kg)  05/31/15 189 lb 6 oz (85.9 kg)  04/30/15 191 lb 4.8 oz (86.773 kg)     GEN: Well nourished, well developed, in no acute distress HEENT: normal Neck: no JVD,   no masses Cardiac:  Normal S1/S2, RRR; 2/6 holosystolic murmur LLSB,  no rubs or gallops, trace bilateral ankle edema   Respiratory:  clear to auscultation bilaterally, no wheezing, rhonchi or rales. GI: soft, nontender, nondistended, + BS MS:  no deformity or atrophy Skin: warm and dry  Neuro:  CNs II-XII intact, Strength and sensation are intact Psych: Normal affect   EKG:  EKG is ordered today.  It demonstrates:   NSR, HR 81, 1st degree AVB (PR 236 ms), NSSTTW changes, QTc 492 ms, no change from prior tracing.    Recent Labs: 03/29/2015: TSH 3.143 05/27/2015: ALT 13*; B Natriuretic Peptide 205.6*; Magnesium 2.1 06/14/2015: BUN 32*; Creatinine, Ser 1.86*; Hemoglobin 9.9*; Platelets 247.0; Potassium 4.3; Sodium 141    Lipid Panel    Component Value Date/Time   CHOL 190 03/29/2015 1240   TRIG 94 03/29/2015 1240   HDL 67 03/29/2015 1240   CHOLHDL  2.8 03/29/2015 1240   VLDL 19 03/29/2015 1240   LDLCALC 104* 03/29/2015 1240      ASSESSMENT AND PLAN:  Severe mitral regurgitation:  As noted, I reviewed her case with Dr. [name] last week.  I will arrange a R and L heart cath.  She will need minimal dye used given CKD.  She will likely need referral to Dr. Roxy Manns post cath to consider MV surgery.  Risks and benefits of cardiac catheterization have been discussed with the patient.  These include bleeding, infection, kidney damage, stroke, heart attack, death.  The patient understands these risks and is willing to proceed.   Chronic diastolic CHF (congestive heart failure):  Volume appears stable.  Continue current dose of Lasix.  Check BMET today.  Essential hypertension:  Fair control.  Continue current Rx.   Hyperlipidemia:  Continue statin.   CKD (chronic kidney disease) stage 4, GFR 15-29 ml/min:  Check BMET today. Will hold Losartan and Lasix beginning the night before the cath.  Will hold Metformin and Invokana 24 hours before and 48 hours after the cath.  Will bring her in several hours before her cath for gentle hydration.  Minimal dye should be used and no LV gram should be done.   IDDM (insulin dependent diabetes mellitus):  As noted, Invokana will need to be held as it can increase Creatinine.  Metformin will be  held according to protocol.       Medication Changes: Current medicines are reviewed at length with the patient today.  Concerns regarding medicines are as outlined above.  The following changes have been made:   Discontinued Medications   FUROSEMIDE (LASIX) 40 MG TABLET    Take 1 tablet (40 mg total) by mouth 2 (two) times daily.   LABETALOL (NORMODYNE) 300 MG TABLET    Take 1 tablet (300 mg total) by mouth 2 (two) times daily.   Modified Medications   No medications on file   New Prescriptions   No medications on file     Labs/ tests ordered today include:   Orders Placed This Encounter  Procedures  . Basic Metabolic Panel (BMET)  . CBC w/Diff  . INR/PT  . EKG 12-Lead     Disposition:   FU with Dr. [name] after her heart cath.    Signed, Versie Starks, MHS 06/14/2015 5:33 PM    [facility] [address], [place], [place]  [address] Phone: [phone]; Fax: [phone]

## 2015-06-14 ENCOUNTER — Other Ambulatory Visit: Payer: Self-pay | Admitting: *Deleted

## 2015-06-14 ENCOUNTER — Encounter: Payer: Self-pay | Admitting: *Deleted

## 2015-06-14 ENCOUNTER — Ambulatory Visit (INDEPENDENT_AMBULATORY_CARE_PROVIDER_SITE_OTHER): Payer: BLUE CROSS/BLUE SHIELD | Admitting: Physician Assistant

## 2015-06-14 ENCOUNTER — Encounter: Payer: Self-pay | Admitting: Physician Assistant

## 2015-06-14 VITALS — BP 140/90 | HR 81 | Ht 59.5 in | Wt 186.8 lb

## 2015-06-14 DIAGNOSIS — I10 Essential (primary) hypertension: Secondary | ICD-10-CM | POA: Diagnosis not present

## 2015-06-14 DIAGNOSIS — I5032 Chronic diastolic (congestive) heart failure: Secondary | ICD-10-CM | POA: Diagnosis not present

## 2015-06-14 DIAGNOSIS — E785 Hyperlipidemia, unspecified: Secondary | ICD-10-CM | POA: Diagnosis not present

## 2015-06-14 DIAGNOSIS — I340 Nonrheumatic mitral (valve) insufficiency: Secondary | ICD-10-CM

## 2015-06-14 DIAGNOSIS — F411 Generalized anxiety disorder: Secondary | ICD-10-CM

## 2015-06-14 DIAGNOSIS — Z794 Long term (current) use of insulin: Secondary | ICD-10-CM

## 2015-06-14 DIAGNOSIS — N184 Chronic kidney disease, stage 4 (severe): Secondary | ICD-10-CM

## 2015-06-14 DIAGNOSIS — E119 Type 2 diabetes mellitus without complications: Secondary | ICD-10-CM

## 2015-06-14 DIAGNOSIS — IMO0001 Reserved for inherently not codable concepts without codable children: Secondary | ICD-10-CM

## 2015-06-14 LAB — CBC WITH DIFFERENTIAL/PLATELET
Basophils Absolute: 0 10*3/uL (ref 0.0–0.1)
Basophils Relative: 0.3 % (ref 0.0–3.0)
Eosinophils Absolute: 0.2 10*3/uL (ref 0.0–0.7)
Eosinophils Relative: 4.5 % (ref 0.0–5.0)
HCT: 30.8 % — ABNORMAL LOW (ref 36.0–46.0)
Hemoglobin: 9.9 g/dL — ABNORMAL LOW (ref 12.0–15.0)
Lymphocytes Relative: 33.4 % (ref 12.0–46.0)
Lymphs Abs: 1.4 10*3/uL (ref 0.7–4.0)
MCHC: 32.1 g/dL (ref 30.0–36.0)
MCV: 81.8 fl (ref 78.0–100.0)
Monocytes Absolute: 0.4 10*3/uL (ref 0.1–1.0)
Monocytes Relative: 9 % (ref 3.0–12.0)
Neutro Abs: 2.2 10*3/uL (ref 1.4–7.7)
Neutrophils Relative %: 52.8 % (ref 43.0–77.0)
Platelets: 247 10*3/uL (ref 150.0–400.0)
RBC: 3.76 Mil/uL — ABNORMAL LOW (ref 3.87–5.11)
RDW: 16.3 % — ABNORMAL HIGH (ref 11.5–15.5)
WBC: 4.3 10*3/uL (ref 4.0–10.5)

## 2015-06-14 LAB — BASIC METABOLIC PANEL
BUN: 32 mg/dL — ABNORMAL HIGH (ref 6–23)
CO2: 29 mEq/L (ref 19–32)
Calcium: 9.9 mg/dL (ref 8.4–10.5)
Chloride: 103 mEq/L (ref 96–112)
Creatinine, Ser: 1.86 mg/dL — ABNORMAL HIGH (ref 0.40–1.20)
GFR: 35.51 mL/min — ABNORMAL LOW (ref >60.00)
Glucose, Bld: 112 mg/dL — ABNORMAL HIGH (ref 70–99)
Potassium: 4.3 mEq/L (ref 3.5–5.1)
Sodium: 141 mEq/L (ref 135–145)

## 2015-06-14 LAB — PROTIME-INR
INR: 1 ratio (ref 0.8–1.0)
Prothrombin Time: 11.1 s (ref 9.6–13.1)

## 2015-06-14 MED ORDER — HYDRALAZINE HCL 100 MG PO TABS
100.0000 mg | ORAL_TABLET | Freq: Three times a day (TID) | ORAL | Status: DC
Start: 2015-06-14 — End: 2015-10-22

## 2015-06-14 NOTE — Patient Instructions (Signed)
Medication Instructions:    Labwork: TODAY BMET, CBC W./DIFF, PT/INR FOR CATH 06/18/15  Testing/Procedures: Your physician has requested that you have a cardiac catheterization 06/18/15 @ 12 NOON . Cardiac catheterization is used to diagnose and/or treat various heart conditions. Doctors may recommend this procedure for a number of different reasons. The most common reason is to evaluate chest pain. Chest pain can be a symptom of coronary artery disease (CAD), and cardiac catheterization can show whether plaque is narrowing or blocking your heart's arteries. This procedure is also used to evaluate the valves, as well as measure the blood flow and oxygen levels in different parts of your heart. For further information please visit [email or web address]. Please follow instruction sheet, as given.   Follow-Up: 2 WEEK DR. CRENSHAW POST CATH FOLLOW UP NEEDED   Any Other Special Instructions Will Be Listed Below (If Applicable).

## 2015-06-15 ENCOUNTER — Telehealth: Payer: Self-pay | Admitting: Physician Assistant

## 2015-06-15 DIAGNOSIS — N189 Chronic kidney disease, unspecified: Secondary | ICD-10-CM

## 2015-06-15 DIAGNOSIS — I5032 Chronic diastolic (congestive) heart failure: Secondary | ICD-10-CM

## 2015-06-15 DIAGNOSIS — N184 Chronic kidney disease, stage 4 (severe): Secondary | ICD-10-CM

## 2015-06-15 NOTE — Telephone Encounter (Signed)
Follow Up  Pt returned call for labs

## 2015-06-15 NOTE — Telephone Encounter (Signed)
Have patient come in Monday 06/21/15 for BMET  Richardson Dopp, PA-C   06/15/2015 5:41 PM

## 2015-06-17 NOTE — Telephone Encounter (Signed)
Pt notified of lab results and is agreeable to coming in  for [facility] 7/25.

## 2015-06-17 NOTE — Telephone Encounter (Signed)
lmptcb on both home and cell # to go over results and recommendation per Brynda Rim. PA to have bmet 7/25.

## 2015-06-18 ENCOUNTER — Encounter: Payer: BLUE CROSS/BLUE SHIELD | Admitting: Cardiology

## 2015-06-18 ENCOUNTER — Inpatient Hospital Stay (HOSPITAL_COMMUNITY)
Admission: AD | Admit: 2015-06-18 | Discharge: 2015-06-19 | DRG: 287 | Disposition: A | Discharge status: Home / Self Care | Payer: BLUE CROSS/BLUE SHIELD | Source: Ambulatory Visit | Attending: Cardiology | Admitting: Cardiology

## 2015-06-18 ENCOUNTER — Encounter (HOSPITAL_COMMUNITY)
Admission: AD | Disposition: A | Discharge status: Home / Self Care | Payer: Self-pay | Source: Ambulatory Visit | Attending: Cardiology

## 2015-06-18 DIAGNOSIS — Z794 Long term (current) use of insulin: Secondary | ICD-10-CM

## 2015-06-18 DIAGNOSIS — E669 Obesity, unspecified: Secondary | ICD-10-CM | POA: Diagnosis present

## 2015-06-18 DIAGNOSIS — Z7982 Long term (current) use of aspirin: Secondary | ICD-10-CM

## 2015-06-18 DIAGNOSIS — E1122 Type 2 diabetes mellitus with diabetic chronic kidney disease: Secondary | ICD-10-CM | POA: Diagnosis present

## 2015-06-18 DIAGNOSIS — I2510 Atherosclerotic heart disease of native coronary artery without angina pectoris: Secondary | ICD-10-CM | POA: Diagnosis not present

## 2015-06-18 DIAGNOSIS — I272 Other secondary pulmonary hypertension: Secondary | ICD-10-CM | POA: Diagnosis present

## 2015-06-18 DIAGNOSIS — I130 Hypertensive heart and chronic kidney disease with heart failure and stage 1 through stage 4 chronic kidney disease, or unspecified chronic kidney disease: Secondary | ICD-10-CM | POA: Diagnosis present

## 2015-06-18 DIAGNOSIS — E559 Vitamin D deficiency, unspecified: Secondary | ICD-10-CM | POA: Diagnosis present

## 2015-06-18 DIAGNOSIS — Z8673 Personal history of transient ischemic attack (TIA), and cerebral infarction without residual deficits: Secondary | ICD-10-CM | POA: Diagnosis not present

## 2015-06-18 DIAGNOSIS — Z888 Allergy status to other drugs, medicaments and biological substances status: Secondary | ICD-10-CM

## 2015-06-18 DIAGNOSIS — I5032 Chronic diastolic (congestive) heart failure: Secondary | ICD-10-CM

## 2015-06-18 DIAGNOSIS — E876 Hypokalemia: Secondary | ICD-10-CM | POA: Diagnosis present

## 2015-06-18 DIAGNOSIS — I313 Pericardial effusion (noninflammatory): Secondary | ICD-10-CM | POA: Diagnosis present

## 2015-06-18 DIAGNOSIS — Z6836 Body mass index (BMI) 36.0-36.9, adult: Secondary | ICD-10-CM | POA: Diagnosis not present

## 2015-06-18 DIAGNOSIS — I10 Essential (primary) hypertension: Secondary | ICD-10-CM | POA: Diagnosis present

## 2015-06-18 DIAGNOSIS — I340 Nonrheumatic mitral (valve) insufficiency: Secondary | ICD-10-CM | POA: Diagnosis not present

## 2015-06-18 DIAGNOSIS — E785 Hyperlipidemia, unspecified: Secondary | ICD-10-CM | POA: Diagnosis present

## 2015-06-18 DIAGNOSIS — N184 Chronic kidney disease, stage 4 (severe): Secondary | ICD-10-CM | POA: Diagnosis present

## 2015-06-18 DIAGNOSIS — D649 Anemia, unspecified: Secondary | ICD-10-CM | POA: Diagnosis present

## 2015-06-18 DIAGNOSIS — Z79899 Other long term (current) drug therapy: Secondary | ICD-10-CM

## 2015-06-18 HISTORY — DX: Pulmonary hypertension, unspecified: I27.20

## 2015-06-18 HISTORY — PX: CARDIAC CATHETERIZATION: SHX172

## 2015-06-18 HISTORY — DX: Other specified postprocedural states: Z98.890

## 2015-06-18 LAB — GLUCOSE, CAPILLARY
Glucose-Capillary: 154 mg/dL — ABNORMAL HIGH (ref 65–99)
Glucose-Capillary: 136 mg/dL — ABNORMAL HIGH (ref 65–99)
Glucose-Capillary: 164 mg/dL — ABNORMAL HIGH (ref 65–99)

## 2015-06-18 LAB — POCT I-STAT 3, VENOUS BLOOD GAS (G3P V)
Acid-base deficit: 2 mmol/L (ref 0.0–2.0)
Acid-base deficit: 2 mmol/L (ref 0.0–2.0)
Bicarbonate: 23.4 mEq/L (ref 20.0–24.0)
Bicarbonate: 24 mEq/L (ref 20.0–24.0)
O2 Saturation: 43 %
O2 Saturation: 40 %
TCO2: 25 mmol/L (ref 0–100)
TCO2: 25 mmol/L (ref 0–100)
pCO2, Ven: 42.3 mmHg — ABNORMAL LOW (ref 45.0–50.0)
pCO2, Ven: 44.1 mmHg — ABNORMAL LOW (ref 45.0–50.0)
pH, Ven: 7.35 — ABNORMAL HIGH (ref 7.250–7.300)
pH, Ven: 7.342 — ABNORMAL HIGH (ref 7.250–7.300)
pO2, Ven: 25 mmHg — CL (ref 30.0–45.0)
pO2, Ven: 25 mmHg — CL (ref 30.0–45.0)

## 2015-06-18 LAB — POCT I-STAT 3, ART BLOOD GAS (G3+)
Acid-base deficit: 2 mmol/L (ref 0.0–2.0)
Acid-base deficit: 3 mmol/L — ABNORMAL HIGH (ref 0.0–2.0)
Bicarbonate: 23.6 mEq/L (ref 20.0–24.0)
Bicarbonate: 22.2 mEq/L (ref 20.0–24.0)
O2 Saturation: 46 %
O2 Saturation: 93 %
TCO2: 25 mmol/L (ref 0–100)
TCO2: 23 mmol/L (ref 0–100)
pCO2 arterial: 42.1 mmHg (ref 35.0–45.0)
pCO2 arterial: 37.8 mmHg (ref 35.0–45.0)
pH, Arterial: 7.356 (ref 7.350–7.450)
pH, Arterial: 7.377 (ref 7.350–7.450)
pO2, Arterial: 26 mmHg — CL (ref 80.0–100.0)
pO2, Arterial: 67 mmHg — ABNORMAL LOW (ref 80.0–100.0)

## 2015-06-18 SURGERY — RIGHT/LEFT HEART CATH AND CORONARY ANGIOGRAPHY
Anesthesia: LOCAL

## 2015-06-18 MED ORDER — SODIUM CHLORIDE 0.9 % IJ SOLN: 3.0000 mL | Freq: Two times a day (BID) | INTRAMUSCULAR | Status: DC

## 2015-06-18 MED ORDER — ONDANSETRON HCL 4 MG/2ML IJ SOLN: 4.0000 mg | Freq: Four times a day (QID) | INTRAMUSCULAR | Status: DC | PRN

## 2015-06-18 MED ORDER — ROSUVASTATIN CALCIUM 20 MG PO TABS
20.0000 mg | ORAL_TABLET | Freq: Every day | ORAL | Status: DC
  Administered 2015-06-18: 20 mg via ORAL
  Filled 2015-06-18: qty 2

## 2015-06-18 MED ORDER — POTASSIUM CHLORIDE CRYS ER 20 MEQ PO TBCR
20.0000 meq | EXTENDED_RELEASE_TABLET | Freq: Every day | ORAL | Status: DC
  Administered 2015-06-18 – 2015-06-19 (×2): 20 meq via ORAL
  Filled 2015-06-18 (×2): qty 1

## 2015-06-18 MED ORDER — HYDRALAZINE HCL 20 MG/ML IJ SOLN
INTRAMUSCULAR | Status: DC | PRN
  Administered 2015-06-18: 20 mg via INTRAVENOUS
  Administered 2015-06-18: 10 mg via INTRAVENOUS

## 2015-06-18 MED ORDER — HYDRALAZINE HCL 50 MG PO TABS
100.0000 mg | ORAL_TABLET | Freq: Three times a day (TID) | ORAL | Status: DC
  Administered 2015-06-18 – 2015-06-19 (×2): 100 mg via ORAL
  Filled 2015-06-18 (×5): qty 2

## 2015-06-18 MED ORDER — SODIUM CHLORIDE 0.9 % IV SOLN
INTRAVENOUS | Status: DC
  Administered 2015-06-18: 09:00:00 via INTRAVENOUS

## 2015-06-18 MED ORDER — HYDRALAZINE HCL 20 MG/ML IJ SOLN
INTRAMUSCULAR | Status: AC
  Filled 2015-06-18: qty 1

## 2015-06-18 MED ORDER — ASPIRIN 81 MG PO CHEW: 81.0000 mg | CHEWABLE_TABLET | ORAL | Status: DC

## 2015-06-18 MED ORDER — INSULIN ASPART PROT & ASPART (70-30 MIX) 100 UNIT/ML ~~LOC~~ SUSP
10.0000 [IU] | Freq: Two times a day (BID) | SUBCUTANEOUS | Status: DC
  Administered 2015-06-19: 10 [IU] via SUBCUTANEOUS

## 2015-06-18 MED ORDER — SODIUM CHLORIDE 0.9 % IV SOLN: INTRAVENOUS | Status: AC

## 2015-06-18 MED ORDER — HYDRALAZINE HCL 20 MG/ML IJ SOLN: 20.0000 mg | INTRAMUSCULAR | Status: DC | PRN

## 2015-06-18 MED ORDER — ASPIRIN EC 81 MG PO TBEC
81.0000 mg | DELAYED_RELEASE_TABLET | Freq: Every day | ORAL | Status: DC
  Administered 2015-06-19: 81 mg via ORAL
  Filled 2015-06-18: qty 1

## 2015-06-18 MED ORDER — SODIUM CHLORIDE 0.9 % IJ SOLN
3.0000 mL | Freq: Two times a day (BID) | INTRAMUSCULAR | Status: DC
  Administered 2015-06-18 – 2015-06-19 (×2): 3 mL via INTRAVENOUS

## 2015-06-18 MED ORDER — HYDROCODONE-ACETAMINOPHEN 5-325 MG PO TABS
ORAL_TABLET | ORAL | Status: AC
  Filled 2015-06-18: qty 2

## 2015-06-18 MED ORDER — LIDOCAINE HCL (PF) 1 % IJ SOLN
INTRAMUSCULAR | Status: AC
  Filled 2015-06-18: qty 30

## 2015-06-18 MED ORDER — LIDOCAINE HCL (PF) 1 % IJ SOLN
INTRAMUSCULAR | Status: DC | PRN
  Administered 2015-06-18: 15:00:00

## 2015-06-18 MED ORDER — ACETAMINOPHEN 325 MG PO TABS
650.0000 mg | ORAL_TABLET | ORAL | Status: DC | PRN
  Administered 2015-06-18: 650 mg via ORAL
  Filled 2015-06-18: qty 2

## 2015-06-18 MED ORDER — HEPARIN (PORCINE) IN NACL 2-0.9 UNIT/ML-% IJ SOLN
INTRAMUSCULAR | Status: AC
  Filled 2015-06-18: qty 1500

## 2015-06-18 MED ORDER — INSULIN ASPART PROT & ASPART (70-30 MIX) 100 UNIT/ML PEN: 20.0000 [IU] | PEN_INJECTOR | Freq: Two times a day (BID) | SUBCUTANEOUS | Status: DC

## 2015-06-18 MED ORDER — LABETALOL HCL 200 MG PO TABS
200.0000 mg | ORAL_TABLET | Freq: Two times a day (BID) | ORAL | Status: DC
  Administered 2015-06-19: 200 mg via ORAL
  Filled 2015-06-18: qty 1

## 2015-06-18 MED ORDER — IOHEXOL 350 MG/ML SOLN
INTRAVENOUS | Status: DC | PRN
  Administered 2015-06-18: 100 mL via INTRAVENOUS

## 2015-06-18 MED ORDER — INSULIN ASPART PROT & ASPART (70-30 MIX) 100 UNIT/ML ~~LOC~~ SUSP
20.0000 [IU] | Freq: Two times a day (BID) | SUBCUTANEOUS | Status: DC
  Administered 2015-06-18: 10 [IU] via SUBCUTANEOUS
  Filled 2015-06-18: qty 10

## 2015-06-18 MED ORDER — METOPROLOL TARTRATE 1 MG/ML IV SOLN
INTRAVENOUS | Status: DC | PRN
  Administered 2015-06-18: 2.5 mg via INTRAVENOUS
  Administered 2015-06-18: 5 mg via INTRAVENOUS

## 2015-06-18 MED ORDER — SODIUM CHLORIDE 0.9 % IV SOLN: 250.0000 mL | INTRAVENOUS | Status: DC | PRN

## 2015-06-18 MED ORDER — METOPROLOL TARTRATE 1 MG/ML IV SOLN
INTRAVENOUS | Status: AC
  Filled 2015-06-18: qty 5

## 2015-06-18 MED ORDER — MIDAZOLAM HCL 2 MG/2ML IJ SOLN
INTRAMUSCULAR | Status: DC | PRN
  Administered 2015-06-18: 1 mg via INTRAVENOUS

## 2015-06-18 MED ORDER — METFORMIN HCL ER 500 MG PO TB24
500.0000 mg | ORAL_TABLET | Freq: Every day | ORAL | Status: DC
Start: 2015-06-20 — End: 2015-06-18

## 2015-06-18 MED ORDER — FUROSEMIDE 40 MG PO TABS
40.0000 mg | ORAL_TABLET | Freq: Two times a day (BID) | ORAL | Status: DC
  Administered 2015-06-18 – 2015-06-19 (×2): 40 mg via ORAL
  Filled 2015-06-18 (×2): qty 1

## 2015-06-18 MED ORDER — FENTANYL CITRATE (PF) 100 MCG/2ML IJ SOLN
INTRAMUSCULAR | Status: AC
  Filled 2015-06-18: qty 2

## 2015-06-18 MED ORDER — LIDOCAINE HCL (PF) 1 % IJ SOLN
INTRAMUSCULAR | Status: DC | PRN
  Administered 2015-06-18: 15 mg

## 2015-06-18 MED ORDER — SODIUM CHLORIDE 0.9 % IJ SOLN: 3.0000 mL | INTRAMUSCULAR | Status: DC | PRN

## 2015-06-18 MED ORDER — CYCLOBENZAPRINE HCL 10 MG PO TABS: 10.0000 mg | ORAL_TABLET | Freq: Two times a day (BID) | ORAL | Status: DC | PRN

## 2015-06-18 MED ORDER — CLONIDINE HCL 0.2 MG/24HR TD PTWK
0.2000 mg | MEDICATED_PATCH | TRANSDERMAL | Status: DC
  Filled 2015-06-18: qty 1

## 2015-06-18 MED ORDER — FENTANYL CITRATE (PF) 100 MCG/2ML IJ SOLN
INTRAMUSCULAR | Status: DC | PRN
  Administered 2015-06-18: 25 ug via INTRAVENOUS

## 2015-06-18 MED ORDER — MIDAZOLAM HCL 2 MG/2ML IJ SOLN
INTRAMUSCULAR | Status: AC
  Filled 2015-06-18: qty 2

## 2015-06-18 MED ORDER — CLONIDINE HCL 0.1 MG PO TABS
0.1000 mg | ORAL_TABLET | Freq: Three times a day (TID) | ORAL | Status: DC | PRN
  Administered 2015-06-18: 0.1 mg via ORAL
  Filled 2015-06-18 (×2): qty 1

## 2015-06-18 SURGICAL SUPPLY — 10 items
CATH INFINITI 5FR MULTPACK ANG (CATHETERS) ×2 IMPLANT
CATH SITESEER 5F NTR (CATHETERS) ×2 IMPLANT
CATH SWAN GANZ 7F STRAIGHT (CATHETERS) ×2 IMPLANT
KIT HEART LEFT (KITS) ×2 IMPLANT
PACK CARDIAC CATHETERIZATION (CUSTOM PROCEDURE TRAY) ×2 IMPLANT
SHEATH PINNACLE 5F 10CM (SHEATH) ×2 IMPLANT
SHEATH PINNACLE 7F 10CM (SHEATH) ×2 IMPLANT
TRANSDUCER W/STOPCOCK (MISCELLANEOUS) ×2 IMPLANT
WIRE EMERALD 3MM-J .035X150CM (WIRE) ×2 IMPLANT
WIRE EMERALD ST .035X150CM (WIRE) ×2 IMPLANT

## 2015-06-18 NOTE — Progress Notes (Signed)
Site area: LFA Site Prior to Removal:  Level 0 Pressure Applied For:25 min Manual:  yes  Patient Status During Pull:  stable Post Pull Site:  Level 0 Post Pull Instructions Given: yes  Post Pull Pulses Present: palpable Dressing Applied:  clear Bedrest begins @ [id number] Comments:

## 2015-06-18 NOTE — Interval H&P Note (Signed)
Cath Lab Visit (complete for each Cath Lab visit)  Clinical Evaluation Leading to the Procedure:   ACS: No.  Non-ACS:    Anginal Classification: CCS III  Anti-ischemic medical therapy: Minimal Therapy (1 class of medications)  Non-Invasive Test Results: No non-invasive testing performed  Prior CABG: No previous CABG      History and Physical Interval Note:  06/18/2015 2:05 PM  Jody Nguyen  has presented today for surgery, with the diagnosis of mv regurg  The various methods of treatment have been discussed with the patient and family. After consideration of risks, benefits and other options for treatment, the patient has consented to  Procedure(s): Right/Left Heart Cath and Coronary Angiography (N/A) as a surgical intervention .  The patient's history has been reviewed, patient examined, no change in status, stable for surgery.  I have reviewed the patient's chart and labs.  Questions were answered to the patient's satisfaction.     VARANASI,JAYADEEP S.

## 2015-06-18 NOTE — Telephone Encounter (Signed)
Pt coming in 7/25 for bmet

## 2015-06-18 NOTE — H&P (View-Only) (Signed)
Cardiology Office Note   Date:  06/14/2015   ID:  ARONDA WIEDERHOLT, DOB 03-11-1956, MRN [id number]  PCP:  Alesia Richards, MD  Cardiologist:  Dr. [name]     Chief Complaint  Patient presents with  . Hospitalization Follow-up  . Congestive Heart Failure  . Mitral Regurgitation     History of Present Illness: Jody Nguyen is a 59 y.o. female with a hx of malignant HTN, diastolic heart failure, stroke, DM, CKD4 , Hyperlipidemia, Vitamin D deficiency and anemia. Myoview in Oct 2012 showed EF 49%, no ischemia, LVE. Renal dopplers Nov 2013 showed no RAS. She was admitted Dec 2015 with CHF after missing Lasix; cardiac MRI showed ejection fraction 61%, moderate left ventricular hypertrophy, large pericardial effusion and mildly reduced RV function. Last echocardiogram in January 2016 showed normal LV function, grade 2 diastolic dysfunction, mildly reduced RV function. Moderate pericardial effusion. Pericardial effusion felt possibly related to minoxidil which was stopped. Last seen by Dr. [name] in the office 04/28/15. Repeat echo 05/06/15 demonstrated EF 55-60%, mild MR, small effusion.   Admitted [id number] with a/c diastolic HF.  She was diuresed with IV Lasix.  DDimer was elevated and VQ scan was low probability for PE.  Repeat echocardiogram demonstrated EF 55-60%, moderate diastolic dysfunction, at least moderate mitral regurgitation (very eccentric, posteriorly directed). Mitral valve not fully visualized. Prolapse of A2 segment of anterior mitral valve leaflet possible. Small circumferential pericardial effusion without tamponade. Recommendation was to consider TEE to more closely assess MR.   TEE was done 06/08/15.  This demonstrated normal LVF, mod to severe LAE, LVH, mildly reduced RVF, small pericardial effusion, flail segment of anterior MV leaflet (A2) with severe eccentric MR, mild TR.  I spoke to Dr. [name] last week.  The patient needs to be set up with  a R/L heart cath.  She will then need to be set up to see Dr. Roxy Manns for consideration of MV repair vs replacement.    She returns for FU.  She is here alone today. She has been doing ok since her TEE. She denies any worsening DOE. She sleeps on 2 pillows chronically without change. No PND.  She did wake up once since DC with chest tightness.  She was able to quickly go back to sleep.  She denies syncope.  Weights have been stable. BP at home stable.  Highest BP at home 140/90.     Studies/Reports Reviewed Today:  TEE 06/08/15 - Left ventricle: Hypertrophy was noted.  EF 55%to 60%. Wall motion was normal - Aortic valve: No evidence of vegetation. - Mitral valve: Moderate flail motion involving the middle segmentof the anterior leaflet due to rupture of one or more chords.There was severe regurgitation directed eccentrically andposteriorly. - Left atrium: The atrium was moderately to severely dilated. - Right ventricle: Systolic function was mildly reduced. - Right atrium: No evidence of thrombus in the atrial cavity orappendage. - Atrial septum: No defect or patent foramen ovale was identified. - Tricuspid valve: No evidence of vegetation. - Pulmonic valve: No evidence of vegetation. - Pericardium, extracardiac: A small pericardial effusion wasidentified. Impressions:  - Normal LV function; LVH; moderate to severe LAE; mildly reduced RV function; small pericardial effusion; flail segment of anterior MV leaflet (A2) with severe eccentric MR; mild TR.  Echo 05/28/15 - Left ventricle:  Mild LVH.  EF 55% to 60%.Wall motion was normal.  Grade 2 diastolic dysfunction). - Aortic valve: There was no stenosis. - Mitral valve: Mildly  calcified annulus. There was at leastmoderate mitral regurgitation. MR was very eccentric,posteriorly-directed. Possible that there is some prolapse of A2segment leading to the MR but difficult to tell mechanism. - Left atrium: The atrium was moderately dilated. -  Right ventricle: The cavity size was normal. Systolic functionwas normal. - Atrial septum: The septum bowed from left to right, consistentwith increased left atrial pressure. - Tricuspid valve: Peak RV-RA gradient (S): 44 mm Hg. - Pulmonary arteries: PA peak pressure: 47 mm Hg (S). - Inferior vena cava: The vessel was normal in size. The respirophasic diameter changes were in the normal range (>= 50%),consistent with normal central venous pressure. - Pericardium, extracardiac: A small circumferential pericardialeffusion was identified, no tamponade. Impressions:- Normal LV size with mild LV hypertrophy. EF 55-60%. Moderatediastolic dysfunction. Very eccentric, posteriorly-directedmitral regurgitation, at least moderate. Not fully visualized. Iam not definitely sure of the mechanism but may be prolapse of A2segment of anterior MV leaflet. Normal RV size and systolicfunction. Mild pulmonary hypertension. Small circumferentialpericardial effusion without tamponade. Would consider TEE tomore closely assess the MR.  Cardiac MRI 11/24/14 IMPRESSION: 1. Large, free-flowing pericardial effusion without evidence for tamponade. 2. Moderate concentric LVH without evidence for asymmetry or SAM to suggest hypertrophic cardiomyopathy. EF 61%, normal. 3. Mildly dilated RV with mildly decreased systolic function. 4. There was a subtle, patchy delayed enhancement pattern in the walls noted above that did not appear to be in a coronary disease pattern. It was not characteristic of amyloidosis, which tends to be more extensive and we did not have trouble nulling the myocardium as you often see with amyloidosis. Patchy delayed enhancement can be seen with severe hypertensive heart disease.  Myoview 08/2011 Low risk stress nuclear study. There is no evidence of ischemia. The LV is dilated but the LV function is normal. EF = 49%.  Past Medical History  Diagnosis Date  . Obesity   . HLD  (hyperlipidemia)   . Vitamin D deficiency   . DM (diabetes mellitus)   . Hypertension     malignant; Renal Art [place] 11/13: no RAS  . CVA (cerebral infarction) 1997    no residual deficit  . Anemia   . CKD (chronic kidney disease) stage 4, GFR 15-29 ml/min   . Diverticulitis   . Chronic diastolic CHF (congestive heart failure)   . History of cardiovascular stress test     a. Myoview Oct 2012 showed EF 49%, no ischemia, LVE  . History of MRI 12.2015    cardiac MRI showed ejection fraction 61%, moderate left ventricular hypertrophy, large pericardial effusion and mildly reduced RV function  . Pericardial effusion     chronic; felt to be poss related to minoxidil >> DC'd  . Mitral regurgitation     a. Echo 7/16:  Mild LVH, EF 0000000, grade 2 diastolic dysfunction, MAC, at least moderate MR-very eccentric, posteriorly directed, possible prolapse of A2 segment; moderate LAE, PASP 47, small effusion    Past Surgical History  Procedure Laterality Date  . Cesarean section      x 2  . Tee without cardioversion N/A 06/08/2015    Procedure: TRANSESOPHAGEAL ECHOCARDIOGRAM (TEE);  Surgeon: Lelon Perla, MD;  Location: [facility] ENDOSCOPY;  Service: Cardiovascular;  Laterality: N/A;     Current Outpatient Prescriptions  Medication Sig Dispense Refill  . aspirin 81 MG tablet Take 81 mg by mouth daily.      . canagliflozin (INVOKANA) 100 MG TABS tablet Take 1 tablet (100 mg total) by mouth daily. 90 tablet 3  .  Cholecalciferol (VITAMIN D-3) 5000 UNITS TABS Take 1 tablet by mouth daily.    . cloNIDine (CATAPRES - DOSED IN MG/24 HR) 0.2 mg/24hr patch Place 1 patch (0.2 mg total) onto the skin every 7 (seven) days. 4 patch 2  . CRESTOR 20 MG tablet TAKE 1 BY MOUTH DAILY 90 tablet 1  . cyclobenzaprine (FLEXERIL) 10 MG tablet Take 1 tablet (10 mg total) by mouth 2 (two) times daily as needed for muscle spasms. 20 tablet 0  . furosemide (LASIX) 40 MG tablet Take 40 mg by mouth 2 (two) times daily.  5  .  hydrALAZINE (APRESOLINE) 100 MG tablet Take 1 tablet (100 mg total) by mouth every 8 (eight) hours. 90 tablet 0  . Insulin Aspart Prot & Aspart (NOVOLOG MIX 70/30 FLEXPEN) (70-30) 100 UNIT/ML Pen Inject 20 Units into the skin 2 (two) times daily. 15 mL 11  . labetalol (NORMODYNE) 200 MG tablet Take 200 mg by mouth 2 (two) times daily.    Marland Kitchen losartan (COZAAR) 50 MG tablet Take 1 tablet (50 mg total) by mouth 2 (two) times daily. 180 tablet 0  . Magnesium 250 MG TABS Take 1 tablet by mouth daily.    . metFORMIN (GLUCOPHAGE-XR) 500 MG 24 hr tablet Take 500 mg by mouth daily.  2  . potassium chloride SA (K-DUR,KLOR-CON) 20 MEQ tablet Take 1 tablet (20 mEq total) by mouth daily. 90 tablet 2   No current facility-administered medications for this visit.    Allergies:   Minoxidil; Lipitor; and Ace inhibitors    Social History:  The patient  reports that she has never smoked. She has never used smokeless tobacco. She reports that she does not drink alcohol or use illicit drugs.   Family History:  The patient's family history includes Breast cancer in her sister; Cancer in her sister; Heart attack in her brother; Stroke in her mother.    ROS:   Please see the history of present illness.   Review of Systems  Cardiovascular: Positive for chest pain.  All other systems reviewed and are negative.     PHYSICAL EXAM: VS:  BP 140/90 mmHg  Pulse 81  Ht 4' 11.5" (1.511 m)  Wt 186 lb 12.8 oz (84.732 kg)  BMI 37.11 kg/m2    Wt Readings from Last 3 Encounters:  06/14/15 186 lb 12.8 oz (84.732 kg)  05/31/15 189 lb 6 oz (85.9 kg)  04/30/15 191 lb 4.8 oz (86.773 kg)     GEN: Well nourished, well developed, in no acute distress HEENT: normal Neck: no JVD,   no masses Cardiac:  Normal S1/S2, RRR; 2/6 holosystolic murmur LLSB,  no rubs or gallops, trace bilateral ankle edema   Respiratory:  clear to auscultation bilaterally, no wheezing, rhonchi or rales. GI: soft, nontender, nondistended, + BS MS:  no deformity or atrophy Skin: warm and dry  Neuro:  CNs II-XII intact, Strength and sensation are intact Psych: Normal affect   EKG:  EKG is ordered today.  It demonstrates:   NSR, HR 81, 1st degree AVB (PR 236 ms), NSSTTW changes, QTc 492 ms, no change from prior tracing.    Recent Labs: 03/29/2015: TSH 3.143 05/27/2015: ALT 13*; B Natriuretic Peptide 205.6*; Magnesium 2.1 06/14/2015: BUN 32*; Creatinine, Ser 1.86*; Hemoglobin 9.9*; Platelets 247.0; Potassium 4.3; Sodium 141    Lipid Panel    Component Value Date/Time   CHOL 190 03/29/2015 1240   TRIG 94 03/29/2015 1240   HDL 67 03/29/2015 1240   CHOLHDL  2.8 03/29/2015 1240   VLDL 19 03/29/2015 1240   LDLCALC 104* 03/29/2015 1240      ASSESSMENT AND PLAN:  Severe mitral regurgitation:  As noted, I reviewed her case with Dr. [name] last week.  I will arrange a R and L heart cath.  She will need minimal dye used given CKD.  She will likely need referral to Dr. Roxy Manns post cath to consider MV surgery.  Risks and benefits of cardiac catheterization have been discussed with the patient.  These include bleeding, infection, kidney damage, stroke, heart attack, death.  The patient understands these risks and is willing to proceed.   Chronic diastolic CHF (congestive heart failure):  Volume appears stable.  Continue current dose of Lasix.  Check BMET today.  Essential hypertension:  Fair control.  Continue current Rx.   Hyperlipidemia:  Continue statin.   CKD (chronic kidney disease) stage 4, GFR 15-29 ml/min:  Check BMET today. Will hold Losartan and Lasix beginning the night before the cath.  Will hold Metformin and Invokana 24 hours before and 48 hours after the cath.  Will bring her in several hours before her cath for gentle hydration.  Minimal dye should be used and no LV gram should be done.   IDDM (insulin dependent diabetes mellitus):  As noted, Invokana will need to be held as it can increase Creatinine.  Metformin will be  held according to protocol.       Medication Changes: Current medicines are reviewed at length with the patient today.  Concerns regarding medicines are as outlined above.  The following changes have been made:   Discontinued Medications   FUROSEMIDE (LASIX) 40 MG TABLET    Take 1 tablet (40 mg total) by mouth 2 (two) times daily.   LABETALOL (NORMODYNE) 300 MG TABLET    Take 1 tablet (300 mg total) by mouth 2 (two) times daily.   Modified Medications   No medications on file   New Prescriptions   No medications on file     Labs/ tests ordered today include:   Orders Placed This Encounter  Procedures  . Basic Metabolic Panel (BMET)  . CBC w/Diff  . INR/PT  . EKG 12-Lead     Disposition:   FU with Dr. [name] after her heart cath.    Signed, Versie Starks, MHS 06/14/2015 5:33 PM    [facility] [place], [place], [place]  [address] Phone: [phone]; Fax: [phone]

## 2015-06-18 NOTE — Progress Notes (Signed)
PHARMACIST - PHYSICIAN COMMUNICATION DR:  Irish Lack CONCERNING:  METFORMIN SAFE ADMINISTRATION POLICY  RECOMMENDATION: Metformin has been placed on DISCONTINUE (rejected order) STATUS and should be reordered only after any of the conditions below are ruled out.  Current safety recommendations include avoiding metformin for a minimum of 48 hours after the patient's exposure to intravenous contrast media.  DESCRIPTION:  The Pharmacy Committee has adopted a policy that restricts the use of metformin in hospitalized patients until all the contraindications to administration have been ruled out. Specific contraindications are: []  Serum creatinine ? 1.5 for males [x]  Serum creatinine ? 1.4 for females []  Shock, acute MI, sepsis, hypoxemia, dehydration []  Planned administration of intravenous iodinated contrast media []  Heart Failure patients with low EF []  Acute or chronic metabolic acidosis (including DKA)      Nicole Cella, RPh Clinical Pharmacist Pager: [phone] 06/18/2015 4:47 PM

## 2015-06-19 ENCOUNTER — Encounter (HOSPITAL_COMMUNITY): Payer: Self-pay | Admitting: *Deleted

## 2015-06-19 LAB — BASIC METABOLIC PANEL
Anion gap: 10 (ref 5–15)
BUN: 21 mg/dL — ABNORMAL HIGH (ref 6–20)
CO2: 23 mmol/L (ref 22–32)
Calcium: 9.2 mg/dL (ref 8.9–10.3)
Chloride: 104 mmol/L (ref 101–111)
Creatinine, Ser: 1.4 mg/dL — ABNORMAL HIGH (ref 0.44–1.00)
GFR calc Af Amer: 46 mL/min — ABNORMAL LOW (ref >60)
GFR calc non Af Amer: 40 mL/min — ABNORMAL LOW (ref >60)
Glucose, Bld: 102 mg/dL — ABNORMAL HIGH (ref 65–99)
Potassium: 3.4 mmol/L — ABNORMAL LOW (ref 3.5–5.1)
Sodium: 137 mmol/L (ref 135–145)

## 2015-06-19 LAB — GLUCOSE, CAPILLARY: Glucose-Capillary: 120 mg/dL — ABNORMAL HIGH (ref 65–99)

## 2015-06-19 MED ORDER — LOSARTAN POTASSIUM 50 MG PO TABS: 50.0000 mg | ORAL_TABLET | Freq: Two times a day (BID) | ORAL | Status: DC

## 2015-06-19 MED ORDER — CANAGLIFLOZIN 100 MG PO TABS: 100.0000 mg | ORAL_TABLET | Freq: Every day | ORAL | Status: DC

## 2015-06-19 MED ORDER — METFORMIN HCL ER 500 MG PO TB24: 500.0000 mg | ORAL_TABLET | Freq: Every day | ORAL | Status: DC

## 2015-06-19 MED ORDER — INSULIN ASPART PROT & ASPART (70-30 MIX) 100 UNIT/ML PEN: 10.0000 [IU] | PEN_INJECTOR | Freq: Two times a day (BID) | SUBCUTANEOUS | Status: DC

## 2015-06-19 MED ORDER — POTASSIUM CHLORIDE CRYS ER 20 MEQ PO TBCR: 40.0000 meq | EXTENDED_RELEASE_TABLET | Freq: Once | ORAL | Status: DC

## 2015-06-19 NOTE — Progress Notes (Signed)
Subjective:  Tolerated catheterization well.  Catheterization site is currently clean and dry.  No shortness of breath today.  Renal function is stable.  Objective:  Vital Signs in the last 24 hours: BP 161/85 mmHg  Pulse 88  Temp(Src) 98.7 F (37.1 C) (Oral)  Resp 23  Ht 4' 11.5" (1.511 m)  Wt 83.9 kg (184 lb 15.5 oz)  BMI 36.75 kg/m2  SpO2 98%  Physical Exam: Obese black female currently in no acute distress Lungs:  Clear  Cardiac:  Regular rhythm, normal S1 and S2, no S3, 2 to 3/6 holosystolic murmur of mitral regurgitation at the left sternal border. Extremities:  Left femoral catheterization site is clean and dry without bruit or hematoma.  Intake/Output from previous day: 07/22 0701 - 07/23 0700 In: 363 [P.O.:360; I.V.:3] Out: 1425 [Urine:1425] Weight Filed Weights   06/18/15 0726 06/19/15 0500  Weight: 84.369 kg (186 lb) 83.9 kg (184 lb 15.5 oz)    Lab Results: Basic Metabolic Panel:  Recent Labs  06/19/15 0351  NA 137  K 3.4*  CL 104  CO2 23  GLUCOSE 102*  BUN 21*  CREATININE 1.40*    Assessment/Plan:  1.  Severe mitral regurgitation 2.  Hypertensive heart disease 3.  Chronic diastolic heart failure 4.  Chronic kidney disease currently stable  Recommendations:  Okay for discharge today.  She'll need early follow-up with Dr. Roxy Manns for consultation for mitral valve repair.  Follow-up with Dr. Stanford Breed within the next week to follow renal function.     Kerry Hough  MD [facility]  06/19/2015, 11:04 AM

## 2015-06-19 NOTE — Discharge Summary (Signed)
Discharge Summary   Patient ID: Jody Nguyen, MRN: [id number], DOB/AGE: 60/24/1957 60 y.o.  Admit date: 06/18/2015 Discharge date: 06/19/2015   Primary Care Physician:  MCKEOWN,WILLIAM DAVID   Primary Cardiologist:  Dr. [name]    Reason for Admission:  Acute on Chronic Diastolic CHF in the setting of Severe Mitral Regurgitation, CKD, Pulmonary HTN   Primary Discharge Diagnoses:  Principal Problem:   Acute on chronic diastolic CHF (congestive heart failure) Active Problems:   Severe mitral regurgitation   Pulmonary hypertension   Hypertension   CKD (chronic kidney disease) stage 4, GFR 15-29 ml/min   Hypokalemia     Wt Readings from Last 3 Encounters:  06/19/15 184 lb 15.5 oz (83.9 kg)  06/14/15 186 lb 12.8 oz (84.732 kg)  05/31/15 189 lb 6 oz (85.9 kg)    Secondary Discharge Diagnoses:   Past Medical History  Diagnosis Date  . Obesity   . HLD (hyperlipidemia)   . Vitamin D deficiency   . DM (diabetes mellitus)   . Hypertension     malignant; Renal Art [place] 11/13: no RAS  . CVA (cerebral infarction) 1997    no residual deficit  . Anemia   . CKD (chronic kidney disease) stage 4, GFR 15-29 ml/min   . Diverticulitis   . Chronic diastolic CHF (congestive heart failure)   . History of cardiovascular stress test     a. Myoview Oct 2012 showed EF 49%, no ischemia, LVE  . History of MRI 12.2015    cardiac MRI showed ejection fraction 61%, moderate left ventricular hypertrophy, large pericardial effusion and mildly reduced RV function  . Pericardial effusion     chronic; felt to be poss related to minoxidil >> DC'd  . Mitral regurgitation     a. Echo 7/16:  Mild LVH, EF 0000000, grade 2 diastolic dysfunction, MAC, at least moderate MR-very eccentric, posteriorly directed, possible prolapse of A2 segment; moderate LAE, PASP 47, small effusion;  b.  TEE 7/16:  Normal LVF, moderate to severe LAE, LVH, mildly reduced RVF, small pericardial effusion, flail segment  of anterior mitral valve leaflet(A2), severe eccentric MR, mild TR  . S/P cardiac catheterization     a. R/L HC 06/18/15:  mLAD 30%; severe pulmo HTN with PA sat 43%, CI 1.86, prominent V waves indicative of MR; resting hypoxemia O2 sat 86% on RA      Allergies:    Allergies  Allergen Reactions  . Minoxidil Other (See Comments)    Pericardial effusion  . Lipitor [Atorvastatin] Other (See Comments)    REACTION: "pain in legs" Tolerates rosuvastatin  . Ace Inhibitors Other (See Comments)    REACTION: "not sure...think it made me drowsy all the time"      Procedures Performed This Admission:   Cardiac catheterization 06/18/15 LAD: Mid 30%  Nonobstructive coronary artery disease.  Severe pulmonary artery hypertension. PA saturation 43%. Cardiac index 1.86. Prominent V waves indicative of mitral regurgitation.  Resting hypoxemia with oxygen saturation 86% on room air.  Systemic hypertension Discussed the case with Dr. Stanford Breed. Will admit the patient. She will need to be diuresed as her renal function allows. Hopefully, this will help with her decreased oxygen saturations and pulmonary hypertension. Further consultation with Dr. Roxy Manns to be obtained for mitral valve repair.   Hospital Course:  Jody Nguyen is a 59 y.o. female with a hx of malignant HTN, diastolic heart failure, mitral regurgitation, stroke, DM, CKD4 , Hyperlipidemia, Vitamin D deficiency and anemia. Myoview in  Oct 2012 showed EF 49%, no ischemia, LVE. Renal dopplers Nov 2013 showed no RAS. She was admitted Dec 2015 with CHF after missing Lasix; cardiac MRI showed ejection fraction 61%, moderate left ventricular hypertrophy, large pericardial effusion and mildly reduced RV function. Last echocardiogram in January 2016 showed normal LV function, grade 2 diastolic dysfunction, mildly reduced RV function. Moderate pericardial effusion. Pericardial effusion felt possibly related to minoxidil which was stopped.  Repeat  echo 05/06/15 demonstrated EF 55-60%, mild MR, small effusion. She was then admitted [id number] with a/c diastolic HF.  Repeat echocardiogram demonstrated EF 55-60%, moderate diastolic dysfunction, at least moderate mitral regurgitation (very eccentric, posteriorly directed). Mitral valve not fully visualized. Prolapse of A2 segment of anterior mitral valve leaflet possible. Small circumferential pericardial effusion without tamponade.  After DC, she was set up with a TEE which was done 06/08/15. This demonstrated normal LVF, mod to severe LAE, LVH, mildly reduced RVF, small pericardial effusion, flail segment of anterior MV leaflet (A2) with severe eccentric MR, mild TR. She was seen in the office 7/18 and set up for outpatient cardiac catheterization.  This was performed 06/18/15. She had mild nonobstructive CAD with a mid LAD 30% lesion, severe pulmonary hypertension with PA saturation 43%, cardiac index 1.86, prominent V waves indicative of mitral regurgitation and resting hypoxemia with O2 sat 86% on room air. She was kept overnight for IV diuresis.  This morning, she was evaluated by Dr. Wynonia Lawman. She was felt to be euvolemic and stable for discharge to home. O2 sats on room air 98%. She is already set up for follow-up labs 06/21/15 with a repeat BMET. She will need referral to Dr. Roxy Manns for consideration of mitral valve repair versus replacement. She already has a follow-up with one of our PAs August 11. Earlier follow-up will be arranged next week for TOC visit.  Of note, potassium is low this morning. She will be given potassium chloride 40 mEq prior to discharge.   Discharge Vitals:   Blood pressure 161/85, pulse 88, temperature 98.7 F (37.1 C), temperature source Oral, resp. rate 23, height 4' 11.5" (1.511 m), weight 184 lb 15.5 oz (83.9 kg), SpO2 98 %.   Labs:  No results for input(s): WBC, HGB, HCT, MCV, PLT in the last 72 hours.   Recent Labs  06/19/15 0351  NA 137  K 3.4*  CL 104  CO2  23  BUN 21*  CREATININE 1.40*  CALCIUM 9.2    Diagnostic Procedures and Studies:  None    Disposition:   Pt is being discharged home today in good condition.  Follow-up Plans & Appointments      Follow-up Information    Follow up with [facility].   Specialty:  Cardiology   Why:  You are scheduled for labs on 06/21/15. Please go to the office to have these drawn (anytime)   Contact information:   [address] [place] [phone]      Follow up with [name], MD.   Specialty:  Cardiology   Why:  The office will call to arrange a follow up this week with one of the PAs/NPs at either [facility] or [facility] location.   Contact information:   [place] [address] [phone]       Discharge Medications    Medication List    TAKE these medications        aspirin 81 MG tablet  Take  81 mg by mouth daily.     canagliflozin 100 MG Tabs tablet  Commonly known as:  INVOKANA  Take 1 tablet (100 mg total) by mouth daily.  Start taking on:  06/21/2015     cloNIDine 0.2 mg/24hr patch  Commonly known as:  CATAPRES - Dosed in mg/24 hr  Place 1 patch (0.2 mg total) onto the skin every 7 (seven) days.     CRESTOR 20 MG tablet  Generic drug:  rosuvastatin  TAKE 1 BY MOUTH DAILY     cyclobenzaprine 10 MG tablet  Commonly known as:  FLEXERIL  Take 1 tablet (10 mg total) by mouth 2 (two) times daily as needed for muscle spasms.     furosemide 40 MG tablet  Commonly known as:  LASIX  Take 40 mg by mouth 2 (two) times daily.     hydrALAZINE 100 MG tablet  Commonly known as:  APRESOLINE  Take 1 tablet (100 mg total) by mouth every 8 (eight) hours.     insulin aspart protamine - aspart (70-30) 100 UNIT/ML FlexPen  Commonly known as:  NOVOLOG MIX 70/30 FLEXPEN  Inject 0.1 mLs (10 Units total) into the skin 2 (two) times daily.     labetalol 200 MG tablet  Commonly known  as:  NORMODYNE  Take 200 mg by mouth 2 (two) times daily.     losartan 50 MG tablet  Commonly known as:  COZAAR  Take 1 tablet (50 mg total) by mouth 2 (two) times daily.  Start taking on:  06/20/2015     Magnesium 250 MG Tabs  Take 250 mg by mouth daily.     metFORMIN 500 MG 24 hr tablet  Commonly known as:  GLUCOPHAGE-XR  Take 1 tablet (500 mg total) by mouth daily.  Start taking on:  06/21/2015     potassium chloride SA 20 MEQ tablet  Commonly known as:  K-DUR,KLOR-CON  Take 1 tablet (20 mEq total) by mouth daily.     Vitamin D-3 5000 UNITS Tabs  Take 5,000 Units by mouth daily.         Outstanding Labs/Studies  1. BMET 06/21/15  Duration of Discharge Encounter: Greater than 30 minutes including physician and PA time.  Signed, Richardson Dopp, PA-C   06/19/2015 12:16 PM    Patient seen earlier and stable for discharge  W. Tollie Eth, Brooke Bonito. MD [facility] 06/19/2015

## 2015-06-19 NOTE — Discharge Instructions (Signed)
Heart Failure Heart failure means your heart has trouble pumping blood. This makes it hard for your body to work well. Heart failure is usually a long-term (chronic) condition. You must take good care of yourself and follow your doctor's treatment plan. HOME CARE  Take your heart medicine as told by your doctor.  Do not stop taking medicine unless your doctor tells you to.  Do not skip any dose of medicine.  Refill your medicines before they run out.  Take other medicines only as told by your doctor or pharmacist.  Stay active if told by your doctor. The elderly and people with severe heart failure should talk with a doctor about physical activity.  Eat heart-healthy foods. Choose foods that are without trans fat and are low in saturated fat, cholesterol, and salt (sodium). This includes fresh or frozen fruits and vegetables, fish, lean meats, fat-free or low-fat dairy foods, whole grains, and high-fiber foods. Lentils and dried peas and beans (legumes) are also good choices.  Limit salt if told by your doctor.  Cook in a healthy way. Roast, grill, broil, bake, poach, steam, or stir-fry foods.  Limit fluids as told by your doctor.  Weigh yourself every morning. Do this after you pee (urinate) and before you eat breakfast. Write down your weight to give to your doctor.  Take your blood pressure and write it down if your doctor tells you to.  Ask your doctor how to check your pulse. Check your pulse as told.  Lose weight if told by your doctor.  Stop smoking or chewing tobacco. Do not use gum or patches that help you quit without your doctor's approval.  Schedule and go to doctor visits as told.  Nonpregnant women should have no more than 1 drink a day. Men should have no more than 2 drinks a day. Talk to your doctor about drinking alcohol.  Stop illegal drug use.  Stay current with shots (immunizations).  Manage your health conditions as told by your doctor.  Learn to  manage your stress.  Rest when you are tired.  If it is really hot outside:  Avoid intense activities.  Use air conditioning or fans, or get in a cooler place.  Avoid caffeine and alcohol.  Wear loose-fitting, lightweight, and light-colored clothing.  If it is really cold outside:  Avoid intense activities.  Layer your clothing.  Wear mittens or gloves, a hat, and a scarf when going outside.  Avoid alcohol.  Learn about heart failure and get support as needed.  Get help to maintain or improve your quality of life and your ability to care for yourself as needed. GET HELP IF:   You gain 03 lb/1.4 kg or more in 1 day or 05 lb/2.3 kg in a week.  You are more short of breath than usual.  You cannot do your normal activities.  You tire easily.  You cough more than normal, especially with activity.  You have any or more puffiness (swelling) in areas such as your hands, feet, ankles, or belly (abdomen).  You cannot sleep because it is hard to breathe.  You feel like your heart is beating fast (palpitations).  You get dizzy or light-headed when you stand up. GET HELP RIGHT AWAY IF:   You have trouble breathing.  There is a change in mental status, such as becoming less alert or not being able to focus.  You have chest pain or discomfort.  You faint. MAKE SURE YOU:   Understand these instructions.  Will watch your condition.  Will get help right away if you are not doing well or get worse. Document Released: 08/22/2008 Document Revised: 03/30/2014 Document Reviewed: 12/30/2012 [employer] Patient Information 2015 [place], [place]. This information is not intended to replace advice given to you by your health care provider. Make sure you discuss any questions you have with your health care provider.  Daily Weight Record It is important to weigh yourself daily. Keep this daily weight chart near your scale. Weigh yourself each morning at the same time. Weigh yourself  without shoes and with the same amount of clothes each day. Compare today's weight to yesterday's weight. Bring this form with you to your follow-up appointments. Call your caregiver if you gain 03 lb/1.4 kg in 1 day. Call your caregiver if you gain 05 lb/2.3 kg in a week. Date: ________ Weight: ____________________ Date: ________ Weight: ____________________ Date: ________ Weight: ____________________ Date: ________ Weight: ____________________ Date: ________ Weight: ____________________ Date: ________ Weight: ____________________ Date: ________ Weight: ____________________ Date: ________ Weight: ____________________ Date: ________ Weight: ____________________ Date: ________ Weight: ____________________ Date: ________ Weight: ____________________ Date: ________ Weight: ____________________ Date: ________ Weight: ____________________ Date: ________ Weight: ____________________ Date: ________ Weight: ____________________ Date: ________ Weight: ____________________ Date: ________ Weight: ____________________ Date: ________ Weight: ____________________ Date: ________ Weight: ____________________ Date: ________ Weight: ____________________ Date: ________ Weight: ____________________ Date: ________ Weight: ____________________ Date: ________ Weight: ____________________ Date: ________ Weight: ____________________ Date: ________ Weight: ____________________ Date: ________ Weight: ____________________ Date: ________ Weight: ____________________ Date: ________ Weight: ____________________ Date: ________ Weight: ____________________ Date: ________ Weight: ____________________ Date: ________ Weight: ____________________ Date: ________ Weight: ____________________ Date: ________ Weight: ____________________ Date: ________ Weight: ____________________ Date: ________ Weight: ____________________ Date: ________ Weight: ____________________ Date: ________ Weight: ____________________ Date: ________ Weight:  ____________________ Date: ________ Weight: ____________________ Date: ________ Weight: ____________________ Date: ________ Weight: ____________________ Date: ________ Weight: ____________________ Date: ________ Weight: ____________________ Date: ________ Weight: ____________________ Date: ________ Weight: ____________________ Date: ________ Weight: ____________________ Date: ________ Weight: ____________________ Date: ________ Weight: ____________________ Date: ________ Weight: ____________________ Date: ________ Weight: ____________________ Document Released: 01/25/2007 Document Revised: 02/05/2012 Document Reviewed: 11/01/2007 [employer] Patient Information 2015 [employer]. This information is not intended to replace advice given to you by your health care provider. Make sure you discuss any questions you have with your health care provider.  Mitral Valve Replacement Mitral valve replacement is surgical replacement of the mitral valve. Many mitral valves can be repaired, especially if they leak from wearing out. When the valve is too damaged to repair, the valve must be replaced. Valves damaged by rheumatic disease often must be replaced. An artificial (prosthetic) valve is used to do this. Three types of prosthetic valves are available:  Mechanical valves made entirely from man-made materials.   Donor valves made from human donors. These are only used in special situations.   Biological valves made from animal tissues.  LET [facility] CARE PROVIDER KNOW ABOUT:  Any allergies you have.   All medicines you are taking, including vitamins, herbs, eye drops, creams, and over-the-counter medicines.   Previous problems you or members of your family have had with the use of anesthetics.   Any blood disorders you have.   Previous surgeries you have had.   Medical conditions you have.  RISKS AND COMPLICATIONS Generally, mitral valve replacement is a safe procedure. However,  problems can occur and include:   Blood clotting caused by the new valve. Replacement with a mechanical valve requires lifelong treatment with medicine to prevent blood clots.  Infection in the new valve. Infection is  more common with valve replacement than with valve repair.   Valve failure. Valve failure is more common with valve replacement than with valve repair. Pig heart valves tend to fail after about 8 to 10 years.  Effects from the surgery itself, such as bleeding, infection, and risks of anesthesia. These risks are rare. BEFORE THE PROCEDURE  You may need to have blood tests, a test to check heart rhythm (electrocardiogram), or echocardiogram to evaluate your heart valves and the blood flow through them.  Ask your health care provider about:  Changing or stopping your regular medicines. This is especially important if you are taking diabetes medicines or blood thinners.  Taking medicines such as aspirin and ibuprofen. These medicines can thin your blood. Do not take these medicines before your procedure if your health care provider asks you not to.  Do not eat or drink anything after midnight on the night before the procedure or as directed by your health care provider. Ask your health care provider if you can take a sip of water with any approved medicines the morning of the procedure.  Do not smoke for as long as possible before the surgery. Smoking can increase the chances of a healing problem after surgery. PROCEDURE  There are two types of mitral valve replacement surgeries:   Traditional mitral valve replacement surgery.  You will be given medicine that makes you fall asleep (general anesthetic).  You will then be placed on a heart-lung bypass machine. This machine provides oxygen to your blood while the heart is undergoing surgery.  During surgery, the surgeon will make a large cut (incision) in the chest. Sometimes the heart will be cooled to slow or stop the  heartbeat.  The damaged mitral valve will be removed and replaced with a prosthetic heart valve.  After the replacement valve is sewn in, the sac around the heart and the chest will be closed.   Minimally invasive mitral valve replacement surgery.  This is done through a smaller incision. The chest is not opened as in traditional surgery.  If your condition allows for this procedure, there will often be less blood loss, less pain, and a faster recovery compared to traditional surgery. AFTER THE PROCEDURE Recovery from heart valve surgery usually involves a few days in the intensive care unit (ICU) of a hospital.  Document Released: 03/16/2005 Document Revised: 03/30/2014 Document Reviewed: 04/16/2013 [facility] Patient Information 2015 [place]. This information is not intended to replace advice given to you by your health care provider. Make sure you discuss any questions you have with your health care provider.

## 2015-06-21 ENCOUNTER — Encounter (HOSPITAL_COMMUNITY): Payer: Self-pay | Admitting: Interventional Cardiology

## 2015-06-21 ENCOUNTER — Telehealth: Payer: Self-pay | Admitting: *Deleted

## 2015-06-21 ENCOUNTER — Other Ambulatory Visit: Payer: Self-pay | Admitting: *Deleted

## 2015-06-21 ENCOUNTER — Other Ambulatory Visit (INDEPENDENT_AMBULATORY_CARE_PROVIDER_SITE_OTHER): Payer: BLUE CROSS/BLUE SHIELD | Admitting: *Deleted

## 2015-06-21 DIAGNOSIS — N184 Chronic kidney disease, stage 4 (severe): Secondary | ICD-10-CM | POA: Diagnosis not present

## 2015-06-21 DIAGNOSIS — I5032 Chronic diastolic (congestive) heart failure: Secondary | ICD-10-CM

## 2015-06-21 DIAGNOSIS — I340 Nonrheumatic mitral (valve) insufficiency: Secondary | ICD-10-CM

## 2015-06-21 DIAGNOSIS — E876 Hypokalemia: Secondary | ICD-10-CM

## 2015-06-21 LAB — BASIC METABOLIC PANEL
BUN: 25 mg/dL — ABNORMAL HIGH (ref 6–23)
CO2: 26 mEq/L (ref 19–32)
Calcium: 9.5 mg/dL (ref 8.4–10.5)
Chloride: 103 mEq/L (ref 96–112)
Creatinine, Ser: 1.76 mg/dL — ABNORMAL HIGH (ref 0.40–1.20)
GFR: 37.84 mL/min — ABNORMAL LOW (ref >60.00)
Glucose, Bld: 119 mg/dL — ABNORMAL HIGH (ref 70–99)
Potassium: 3.3 mEq/L — ABNORMAL LOW (ref 3.5–5.1)
Sodium: 138 mEq/L (ref 135–145)

## 2015-06-21 MED ORDER — POTASSIUM CHLORIDE CRYS ER 20 MEQ PO TBCR: 40.0000 meq | EXTENDED_RELEASE_TABLET | Freq: Every day | ORAL | Status: DC

## 2015-06-21 NOTE — Progress Notes (Signed)
This encounter was created in error - please disregard.

## 2015-06-21 NOTE — Telephone Encounter (Signed)
Pt notified of lab results and to increase K+ to 40 meq daily starting today. Per PA to be seen this week on Flex with bmet same day. Pt agreeable to plan of care.

## 2015-06-21 NOTE — Addendum Note (Signed)
Addended by: Eulis Foster on: 06/21/2015 07:49 AM   Modules accepted: Orders

## 2015-06-23 ENCOUNTER — Encounter: Payer: Self-pay | Admitting: Thoracic Surgery (Cardiothoracic Vascular Surgery)

## 2015-06-23 ENCOUNTER — Other Ambulatory Visit: Payer: Self-pay | Admitting: *Deleted

## 2015-06-23 ENCOUNTER — Institutional Professional Consult (permissible substitution) (INDEPENDENT_AMBULATORY_CARE_PROVIDER_SITE_OTHER): Payer: BLUE CROSS/BLUE SHIELD | Admitting: Thoracic Surgery (Cardiothoracic Vascular Surgery)

## 2015-06-23 VITALS — BP 150/90 | HR 84 | Resp 20 | Ht 59.5 in | Wt 185.0 lb

## 2015-06-23 DIAGNOSIS — I340 Nonrheumatic mitral (valve) insufficiency: Secondary | ICD-10-CM

## 2015-06-23 DIAGNOSIS — I7409 Other arterial embolism and thrombosis of abdominal aorta: Secondary | ICD-10-CM

## 2015-06-23 DIAGNOSIS — I5032 Chronic diastolic (congestive) heart failure: Secondary | ICD-10-CM | POA: Insufficient documentation

## 2015-06-23 DIAGNOSIS — I5042 Chronic combined systolic (congestive) and diastolic (congestive) heart failure: Secondary | ICD-10-CM | POA: Insufficient documentation

## 2015-06-23 DIAGNOSIS — I719 Aortic aneurysm of unspecified site, without rupture: Secondary | ICD-10-CM

## 2015-06-23 MED ORDER — AMIODARONE HCL 200 MG PO TABS: 200.0000 mg | ORAL_TABLET | Freq: Two times a day (BID) | ORAL | Status: DC

## 2015-06-23 NOTE — Progress Notes (Signed)
[address]       [place] [address]             [phone]     CARDIOTHORACIC SURGERY CONSULTATION REPORT  Referring Provider is Stanford Breed, Denice Bors, MD PCP is Alesia Richards, MD  Chief Complaint  Patient presents with  . Mitral Regurgitation    Surgical eval for possible mitral valve repair, Cardiac Cath 06/18/15, TEE 06/08/15,     HPI:  Patient is a 59 year old African-American female with history of long-standing hypertension, chronic diastolic congestive heart failure, stage IV chronic kidney disease, type 2 diabetes mellitus with complication, and hyperlipidemia who has been referred for surgical consultation to discuss treatment options for management of severe symptomatic mitral regurgitation.  The patient has a long history of hypertension with hypertensive cardiomyopathy, diastolic dysfunction, and chronic diastolic congestive heart failure. She has been followed for several years by Dr. Stanford Breed. Echocardiogram performed in February 2014 revealed mild left ventricular enlargement, moderate left ventricular hypertrophy, and ejection fraction estimated 50-55%. There was moderate left atrial enlargement, mild mitral regurgitation, and a small pericardial effusion.  The patient admits to a long history of relatively mild symptoms of exertional shortness of breath that have gradually progressed over several years. She was hospitalized in December 2015 with an acute exacerbation of chronic diastolic congestive heart failure and resting shortness of breath. Echocardiogram performed January 2016 revealed normal left ventricular systolic function, grade 2 diastolic dysfunction, mildly reduced RV function, and a moderate-sized pericardial effusion. The patient's pericardial effusion was felt possibly related to minoxidil therapy, and this was stopped.  Over the past 6 months the patient's symptoms of congestive heart failure have progressed with more limiting  symptoms of severe exertional shortness of breath.  Follow-up echocardiogram performed 05/06/2015 revealed normal left ventricular function with very small residual pericardial effusion.  Shortly after that the patient was hospitalized again with an acute exacerbation of chronic diastolic congestive heart failure. Repeat transthoracic echocardiogram performed 05/28/2015 revealed findings consistent with at least moderate and possibly severe mitral regurgitation. The patient subsequently underwent transesophageal echocardiogram 06/08/2015 confirming the presence of severe mitral regurgitation with mitral valve prolapse and flail segment involving the anterior leaflet of the mitral valve. The patient underwent left and right heart catheterization on 06/18/2015 revealing the presence of mild nonobstructive coronary artery disease.  The patient had severe pulmonary hypertension and prominent V waves on pulmonary capillary wedge tracing consistent with severe mitral regurgitation. The patient was again admitted to hospital for intravenous diuresis following her cath.  She was subsequently referred for elective cardiothoracic surgical consultation.  The patient is married and lives locally in [place] with her husband and 1 adult daughter.  They have a total of 3 adult children and numerous grandchildren. The patient is currently not working. In the past she has worked in an Tree surgeon. She reports no physical limitations with exception of her progressive symptoms of exertional shortness of breath. The symptoms have progressed since December of this past year.  The patient now gets short of breath with very mild physical activity and occasionally at rest. She cannot lay flat in bed without feeling short of breath. She has intermittent episodes of PND. She has not experienced lower extremity edema. She has never had any chest pain or chest tightness either with activity or at rest. She reports  palpitations but she has no history of irregular heart rhythm or atrial fibrillation.  Past Medical History  Diagnosis Date  .  HLD (hyperlipidemia)   . Vitamin D deficiency   . DM (diabetes mellitus)   . Hypertension     malignant; Renal Art [place] 11/13: no RAS  . CVA (cerebral infarction) 1997    no residual deficit  . Anemia   . CKD (chronic kidney disease) stage 4, GFR 15-29 ml/min   . Diverticulitis   . Chronic diastolic CHF (congestive heart failure)   . History of cardiovascular stress test     a. Myoview Oct 2012 showed EF 49%, no ischemia, LVE  . History of MRI 12.2015    cardiac MRI showed ejection fraction 61%, moderate left ventricular hypertrophy, large pericardial effusion and mildly reduced RV function  . Pericardial effusion     chronic; felt to be poss related to minoxidil >> DC'd  . S/P cardiac catheterization     a. R/L HC 06/18/15:  mLAD 30%; severe pulmo HTN with PA sat 43%, CI 1.86, prominent V waves indicative of MR; resting hypoxemia O2 sat 86% on RA  . Pulmonary hypertension 06/18/2015  . Severe mitral regurgitation   . Morbid obesity (BMI 36.05)  03/29/2015  . Chronic diastolic heart failure     Past Surgical History  Procedure Laterality Date  . Cesarean section      x 2  . Tee without cardioversion N/A 06/08/2015    Procedure: TRANSESOPHAGEAL ECHOCARDIOGRAM (TEE);  Surgeon: Lelon Perla, MD;  Location: [facility] ENDOSCOPY;  Service: Cardiovascular;  Laterality: N/A;  . Cardiac catheterization    . Cardiac catheterization N/A 06/18/2015    Procedure: Right/Left Heart Cath and Coronary Angiography;  Surgeon: Jettie Booze, MD;  Location: [facility] LAB;  Service: Cardiovascular;  Laterality: N/A;    Family History  Problem Relation Age of Onset  . Heart attack Brother     MI in his 75s  . Cancer Sister     breast cancer  . Breast cancer Sister   . Stroke Mother     History   Social History  . Marital Status: Married    Spouse Name: N/A  .  Number of Children: 3  . Years of Education: N/A   Occupational History  .      Unemployed; used to work as a Radio broadcast assistant   Social History Main Topics  . Smoking status: Never Smoker   . Smokeless tobacco: Never Used  . Alcohol Use: No  . Drug Use: No  . Sexual Activity: Yes    Birth Control/ Protection: None   Other Topics Concern  . Not on file   Social History Narrative    Current Outpatient Prescriptions  Medication Sig Dispense Refill  . aspirin 81 MG tablet Take 81 mg by mouth daily.      . canagliflozin (INVOKANA) 100 MG TABS tablet Take 1 tablet (100 mg total) by mouth daily. 90 tablet 3  . Cholecalciferol (VITAMIN D-3) 5000 UNITS TABS Take 5,000 Units by mouth daily.     . cloNIDine (CATAPRES - DOSED IN MG/24 HR) 0.2 mg/24hr patch Place 1 patch (0.2 mg total) onto the skin every 7 (seven) days. 4 patch 2  . CRESTOR 20 MG tablet TAKE 1 BY MOUTH DAILY (Patient taking differently: Take 20 mg by mouth daily) 90 tablet 1  . cyclobenzaprine (FLEXERIL) 10 MG tablet Take 1 tablet (10 mg total) by mouth 2 (two) times daily as needed for muscle spasms. 20 tablet 0  . furosemide (LASIX) 40 MG tablet Take 40 mg by mouth 2 (two)  times daily.  5  . hydrALAZINE (APRESOLINE) 100 MG tablet Take 1 tablet (100 mg total) by mouth every 8 (eight) hours. 90 tablet 0  . insulin aspart protamine - aspart (NOVOLOG MIX 70/30 FLEXPEN) (70-30) 100 UNIT/ML FlexPen Inject 0.1 mLs (10 Units total) into the skin 2 (two) times daily. 15 mL 11  . labetalol (NORMODYNE) 200 MG tablet Take 200 mg by mouth 2 (two) times daily.    Marland Kitchen losartan (COZAAR) 50 MG tablet Take 1 tablet (50 mg total) by mouth 2 (two) times daily. 180 tablet 0  . Magnesium 250 MG TABS Take 250 mg by mouth daily.     . metFORMIN (GLUCOPHAGE-XR) 500 MG 24 hr tablet Take 1 tablet (500 mg total) by mouth daily.  2  . potassium chloride SA (K-DUR,KLOR-CON) 20 MEQ tablet Take 2 tablets (40 mEq total) by mouth daily. 60 tablet 11     No current facility-administered medications for this visit.    Allergies  Allergen Reactions  . Minoxidil Other (See Comments)    Pericardial effusion  . Lipitor [Atorvastatin] Other (See Comments)    REACTION: "pain in legs" Tolerates rosuvastatin  . Ace Inhibitors Other (See Comments)    REACTION: "not sure...think it made me drowsy all the time"      Review of Systems:   General:  normal appetite, decreased energy, no weight gain, no weight loss, no fever  Cardiac:  no chest pain with exertion, no chest pain at rest, + SOB with exertion, + occasional resting SOB, + PND, + orthopnea, + palpitations, no arrhythmia, no atrial fibrillation, no LE edema, no dizzy spells, no syncope  Respiratory:  + shortness of breath, no home oxygen, no productive cough, no dry cough, no bronchitis, no wheezing, no hemoptysis, no asthma, no pain with inspiration or cough, no sleep apnea, no CPAP at night  GI:   no difficulty swallowing, no reflux, no frequent heartburn, no hiatal hernia, no abdominal pain, no constipation, no diarrhea, no hematochezia, no hematemesis, no melena  GU:   no dysuria,  no frequency, no urinary tract infection, no hematuria, no kidney stones, + stage IV chronic kidney disease  Vascular:  no pain suggestive of claudication, no pain in feet, no leg cramps, no varicose veins, no DVT, no non-healing foot ulcer  Neuro:   + stroke, no TIA's, no seizures, no headaches, no temporary blindness one eye,  no slurred speech, no peripheral neuropathy, no chronic pain, no instability of gait, no memory/cognitive dysfunction  Musculoskeletal: no arthritis, no joint swelling, no myalgias, no difficulty walking, normal mobility   Skin:   no rash, no itching, no skin infections, no pressure sores or ulcerations  Psych:   no anxiety, no depression, no nervousness, no unusual recent stress  Eyes:   no blurry vision, no floaters, no recent vision changes, + wears glasses for reading  ENT:   no  hearing loss, no loose or painful teeth, no dentures, last saw dentist many years ago  Hematologic:  no easy bruising, no abnormal bleeding, no clotting disorder, no frequent epistaxis  Endocrine:  + diabetes, does check CBG's at home     Physical Exam:   BP 150/90 mmHg  Pulse 84  Resp 20  Ht 4' 11.5" (1.511 m)  Wt 185 lb (83.915 kg)  BMI 36.75 kg/m2  SpO2 93%  General:  Obese but o/w  well-appearing  HEENT:  Unremarkable   Neck:   no JVD, no bruits, no adenopathy   Chest:  clear to auscultation, symmetrical breath sounds, no wheezes, no rhonchi   CV:   RRR, grade IV/VI holosystolic murmur with radiation to axilla  Abdomen:  soft, non-tender, no masses   Extremities:  warm, well-perfused, pulses palpable but diminished, no LE edema  Rectal/GU  Deferred  Neuro:   Grossly non-focal and symmetrical throughout  Skin:   Clean and dry, no rashes, no breakdown   Diagnostic Tests:  Transthoracic Echocardiography  Patient:  Peyten, Jody Nguyen MR #:    [id number] Study Date: 05/28/2015 Gender:   F Age:    59 Height:   149.9 cm Weight:   87.5 kg BSA:    1.96 m^2 Pt. Status: Room:    2W38C  ADMITTING  Darlin Coco ATTENDING  Darlin Coco PERFORMING  [facility], Inpatient ORDERING   Bhagat, Bhavinkumar REFERRING  [place], [place] SONOGRAPHER Mikki Santee  cc:  ------------------------------------------------------------------- LV EF: 55% -  60%  ------------------------------------------------------------------- Indications:   Pericardial effusion 423.9.  ------------------------------------------------------------------- History:  PMH:  Congestive heart failure. Stroke. Risk factors: Hypertension. Diabetes mellitus.  ------------------------------------------------------------------- Study Conclusions  - Left ventricle: The cavity size was normal. Wall thickness was increased in a pattern of mild LVH.  Systolic function was normal. The estimated ejection fraction was in the range of 55% to 60%. Wall motion was normal; there were no regional wall motion abnormalities. Features are consistent with a pseudonormal left ventricular filling pattern, with concomitant abnormal relaxation and increased filling pressure (grade 2 diastolic dysfunction). - Aortic valve: There was no stenosis. - Mitral valve: Mildly calcified annulus. There was at least moderate mitral regurgitation. MR was very eccentric, posteriorly-directed. Possible that there is some prolapse of A2 segment leading to the MR but difficult to tell mechanism. - Left atrium: The atrium was moderately dilated. - Right ventricle: The cavity size was normal. Systolic function was normal. - Atrial septum: The septum bowed from left to right, consistent with increased left atrial pressure. - Tricuspid valve: Peak RV-RA gradient (S): 44 mm Hg. - Pulmonary arteries: PA peak pressure: 47 mm Hg (S). - Inferior vena cava: The vessel was normal in size. The respirophasic diameter changes were in the normal range (>= 50%), consistent with normal central venous pressure. - Pericardium, extracardiac: A small circumferential pericardial effusion was identified, no tamponade.  Impressions:  - Normal LV size with mild LV hypertrophy. EF 55-60%. Moderate diastolic dysfunction. Very eccentric, posteriorly-directed mitral regurgitation, at least moderate. Not fully visualized. I am not definitely sure of the mechanism but may be prolapse of A2 segment of anterior MV leaflet. Normal RV size and systolic function. Mild pulmonary hypertension. Small circumferential pericardial effusion without tamponade. Would consider TEE to more closely assess the MR.  Transthoracic echocardiography. M-mode, complete 2D, spectral Doppler, and color Doppler. Birthdate: Patient birthdate: 01/28/1956. Age: Patient is  59 yr old. Sex: Gender: female. BMI: 39 kg/m^2. Blood pressure:   149/89 Patient status: Inpatient. Study date: Study date: 05/28/2015. Study time: 10:46 AM. Location: Bedside.  -------------------------------------------------------------------  ------------------------------------------------------------------- Left ventricle: The cavity size was normal. Wall thickness was increased in a pattern of mild LVH. Systolic function was normal. The estimated ejection fraction was in the range of 55% to 60%. Wall motion was normal; there were no regional wall motion abnormalities. Features are consistent with a pseudonormal left ventricular filling pattern, with concomitant abnormal relaxation and increased filling pressure (grade 2 diastolic dysfunction).  ------------------------------------------------------------------- Aortic valve:  Trileaflet. Doppler:  There was no stenosis. There was no regurgitation.  ------------------------------------------------------------------- Aorta: Aortic root: The  aortic root was normal in size. Ascending aorta: The ascending aorta was normal in size.  ------------------------------------------------------------------- Mitral valve:  Mildly calcified annulus. Doppler:  There was no evidence for stenosis.  There was at least moderate mitral regurgitation. MR was very eccentric, posteriorly-directed. Possible that there is some prolapse of A2 segment leading to the MR but difficult to tell mechanism.  Peak gradient (D): 7 mm Hg.  ------------------------------------------------------------------- Left atrium: The atrium was moderately dilated.  ------------------------------------------------------------------- Atrial septum: The septum bowed from left to right, consistent with increased left atrial pressure.  ------------------------------------------------------------------- Right ventricle: The cavity size was normal.  Systolic function was normal.  ------------------------------------------------------------------- Pulmonic valve:  Structurally normal valve.  Cusp separation was normal. Doppler: Transvalvular velocity was within the normal range. There was trivial regurgitation.  ------------------------------------------------------------------- Tricuspid valve:  Doppler: There was trivial regurgitation.  ------------------------------------------------------------------- Right atrium: The atrium was normal in size.  ------------------------------------------------------------------- Pericardium: A small circumferential pericardial effusion was identified, no tamponade.  ------------------------------------------------------------------- Systemic veins: Inferior vena cava: The vessel was normal in size. The respirophasic diameter changes were in the normal range (>= 50%), consistent with normal central venous pressure.  ------------------------------------------------------------------- Measurements  Left ventricle               Value    Reference LV ID, ED, PLAX chordal       (H)   52.4 mm   43 - 52 LV ID, ES, PLAX chordal           30.3 mm   23 - 38 LV fx shortening, PLAX chordal       42  %   >=29 LV PW thickness, ED             17.7 mm   --------- IVS/LV PW ratio, ED             0.79     <=1.3 LV e&', lateral               11.5 cm/s  --------- LV E/e&', lateral              11.83    --------- LV e&', medial                9.1  cm/s  --------- LV E/e&', medial               14.95    --------- LV e&', average               10.3 cm/s  --------- LV E/e&', average              13.2     ---------  Ventricular septum             Value    Reference IVS thickness,  ED              13.9 mm   ---------  LVOT                    Value    Reference LVOT ID, S                 20  mm   --------- LVOT area                  3.14 cm^2  ---------  Aorta                    Value    Reference Aortic root ID, ED  26  mm   ---------  Left atrium                 Value    Reference LA ID, A-P, ES               46  mm   --------- LA ID/bsa, A-P           (H)   2.35 cm/m^2 <=2.2 LA volume, ES, 1-p A4C           79  ml   --------- LA volume/bsa, ES, 1-p A4C         40.3 ml/m^2 --------- LA volume, ES, 1-p A2C           80  ml   --------- LA volume/bsa, ES, 1-p A2C         40.8 ml/m^2 ---------  Mitral valve                Value    Reference Mitral E-wave peak velocity         136  cm/s  --------- Mitral deceleration time      (H)   324  ms   150 - 230 Mitral peak gradient, D           7   mm Hg --------- Mitral maximal regurg velocity,       622  cm/s  --------- PISA Mitral regurg VTI, PISA           203  cm   ---------  Pulmonary arteries             Value    Reference PA pressure, S, DP         (H)   47  mm Hg <=30  Tricuspid valve               Value    Reference Tricuspid regurg peak velocity       330  cm/s  --------- Tricuspid peak RV-RA gradient        44  mm Hg ---------  Systemic veins               Value    Reference Estimated CVP                3   mm Hg ---------  Right ventricle               Value    Reference RV s&', lateral, S              13.4 cm/s   ---------  Legend: (L) and (H) mark values outside specified reference range.  ------------------------------------------------------------------- Prepared and Electronically Authenticated by  Loralie Champagne, M.D. 2016-07-01T15:31:35    Transesophageal Echocardiography  Patient:  Rithvika, Jody Nguyen MR #:    [id number] Study Date: 06/08/2015 Gender:   F Age:    59 Height:   149.9 cm Weight:   85.9 kg BSA:    1.94 m^2 Pt. Status: Room:  [place] ORDERING   [name] PERFORMING  [place] SONOGRAPHER Johny Chess, RDCS, CCT  cc:  ------------------------------------------------------------------- LV EF: 55% -  60%  ------------------------------------------------------------------- Indications:   Mitral regurgitation 424.0.  ------------------------------------------------------------------- Study Conclusions  - Left ventricle: Hypertrophy was noted. Systolic function was normal. The estimated ejection fraction was in the range of 55% to 60%. Wall motion was normal; there were no regional wall motion abnormalities. - Aortic  valve: No evidence of vegetation. - Mitral valve: Moderate flail motion involving the middle segment of the anterior leaflet due to rupture of one or more chords. There was severe regurgitation directed eccentrically and posteriorly. - Left atrium: The atrium was moderately to severely dilated. - Right ventricle: Systolic function was mildly reduced. - Right atrium: No evidence of thrombus in the atrial cavity or appendage. - Atrial septum: No defect or patent foramen ovale was identified. - Tricuspid valve: No evidence of vegetation. - Pulmonic valve: No evidence of vegetation. - Pericardium, extracardiac: A small pericardial effusion was identified.  Impressions:  - Normal LV function; LVH;  moderate to severe LAE; mildly reduced RV function; small pericardial effusion; flail segment of anterior MV leaflet (A2) with severe eccentric MR; mild TR.  Diagnostic transesophageal echocardiography. 2D and color Doppler. Birthdate: Patient birthdate: August 08, 1956. Age: Patient is 59 yr old. Sex: Gender: female.  BMI: 38.3 kg/m^2. Blood pressure: 172/91 Patient status: Outpatient. Study date: Study date: 06/08/2015. Study time: 08:13 AM. Location: Endoscopy.  -------------------------------------------------------------------  ------------------------------------------------------------------- Left ventricle: Hypertrophy was noted. Systolic function was normal. The estimated ejection fraction was in the range of 55% to 60%. Wall motion was normal; there were no regional wall motion abnormalities.  ------------------------------------------------------------------- Aortic valve:  Structurally normal valve.  Cusp separation was normal. No evidence of vegetation. Doppler: There was no regurgitation.  ------------------------------------------------------------------- Aorta: Descending aorta: The descending aorta had mild diffuse disease.  ------------------------------------------------------------------- Mitral valve:  Moderate flail motion involving the middle segment of the anterior leaflet due to rupture of one or more chords. Doppler: There was severe regurgitation directed eccentrically and posteriorly.  Mean gradient (D): 5 mm Hg.  ------------------------------------------------------------------- Left atrium: The atrium was moderately to severely dilated.  ------------------------------------------------------------------- Atrial septum: No defect or patent foramen ovale was identified.  ------------------------------------------------------------------- Right ventricle: The cavity size was normal. Systolic function was mildly  reduced.  ------------------------------------------------------------------- Pulmonic valve:  Structurally normal valve.  Cusp separation was normal. No evidence of vegetation. Doppler: There was mild regurgitation.  ------------------------------------------------------------------- Tricuspid valve:  Structurally normal valve.  Leaflet separation was normal. No evidence of vegetation. Doppler: There was mild regurgitation.  ------------------------------------------------------------------- Right atrium: The atrium was normal in size. No evidence of thrombus in the atrial cavity or appendage.  ------------------------------------------------------------------- Pericardium: A small pericardial effusion was identified.  ------------------------------------------------------------------- Measurements  Mitral valve         Value Mitral mean velocity, D   104  cm/s Mitral mean gradient, D   5   mm Hg Mitral annulus VTI, D    30.8 cm  Legend: (L) and (H) mark values outside specified reference range.  ------------------------------------------------------------------- Prepared and Electronically Authenticated by  [name] 2016-07-12T09:17:24    CARDIAC CATHETERIZATION  Conclusion     Nonobstructive coronary artery disease.  Severe pulmonary artery hypertension. PA saturation 43%. Cardiac index 1.86. Prominent V waves indicative of mitral regurgitation.  Resting hypoxemia with oxygen saturation 86% on room air.  Systemic hypertension  Discussed the case with Dr. Stanford Breed. Will admit the patient. She will need to be diuresed as her renal function allows. Hopefully, this will help with her decreased oxygen saturations and pulmonary hypertension. Further consultation with Dr. Roxy Manns to be obtained for mitral valve repair.     Coronary Findings    Dominance: Right   Left Anterior Descending   . Mid LAD lesion, 30%  stenosed.   . Second Diagonal Branch   The vessel is small in size.   . Third Diagonal Branch  The vessel is small in size.     Left Circumflex  The vessel is small .       Right Heart Pressures Hemodynamic findings consistent with severe pulmonary hypertension. Elevated LV EDP consistent with volume overload. Prominent V waves    Left Heart    Left Ventricle elevated LVEDP   Aortic Valve There is no aortic valve stenosis.    Coronary Diagrams    Diagnostic Diagram            Implants    Name ID Temporary Type Supply   No information to display    Hemo Data       Most Recent Value   Fick Cardiac Output  3.37 L/min   Fick Cardiac Output Index  1.86 (L/min)/BSA   RA A Wave  19 mmHg   RA V Wave  16 mmHg   RA Mean  14 mmHg   RV Systolic Pressure  92 mmHg   RV Diastolic Pressure  11 mmHg   RV EDP  17 mmHg   PA Systolic Pressure  99 mmHg   PA Diastolic Pressure  49 mmHg   PA Mean  67 mmHg   PW A Wave  29 mmHg   PW V Wave  23 mmHg   PW Mean  25 mmHg   AO Systolic Pressure  123XX123 mmHg   AO Diastolic Pressure  [id number] mmHg   AO Mean  0000000 mmHg   LV Systolic Pressure  0000000 mmHg   LV Diastolic Pressure  16 mmHg   LV EDP  22 mmHg   Arterial Occlusion Pressure Extended Systolic Pressure  123XX123 mmHg   Arterial Occlusion Pressure Extended Diastolic Pressure  99991111 mmHg   Arterial Occlusion Pressure Extended Mean Pressure  135 mmHg   Left Ventricular Apex Extended Systolic Pressure  123456 mmHg   Left Ventricular Apex Extended Diastolic Pressure  20 mmHg   Left Ventricular Apex Extended EDP Pressure  24 mmHg   QP/QS  1   TPVR Index  35.95 HRUI   TSVR Index  73.5 HRUI   PVR SVR Ratio  0.34   TPVR/TSVR Ratio  0.49      Impression:  Patient has stage D severe symptomatic primary mitral regurgitation with preserved left ventricular systolic function. She presents with gradual progression of symptoms of shortness of breath, orthopnea,  and PND consistent with chronic diastolic congestive heart failure, recently New [place] Heart Association class III-IV.  She has been hospitalized a total of 3 times since this past December with acute exacerbations of congestive heart failure. I have personally reviewed the patient's recent transthoracic and transesophageal echocardiograms and diagnostic cardiac catheterization. 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 Left ventricular systolic function remains preserved, although the patient does have significant left ventricular hypertrophy and diastolic dysfunction.  The patient does not have significant coronary artery disease but right heart catheterization was notable for the presence of severe pulmonary hypertension. Risks associated with surgery will be somewhat increased because of the patient's numerous comorbid medical problems including morbid obesity, long-standing hypertension with hypertensive cardiomyopathy, type 2 diabetes mellitus with complications, previous stroke in the remote past, and chronic kidney disease.    Plan:  The patient and her husband were counseled at length regarding the indications, risks and potential benefits of mitral valve repair.  The rationale for elective surgery has been explained, including  a comparison between surgery and continued medical therapy with close follow-up.  The likelihood of successful and durable valve repair has been discussed with particular reference to the findings of their recent echocardiogram.  Based upon these findings and previous experience, I have quoted them a greater than 80 percent likelihood of successful valve repair.  In the unlikely event that their valve cannot be successfully repaired, we discussed the possibility of replacing the mitral valve using a mechanical prosthesis with the attendant need  for long-term anticoagulation versus the alternative of replacing it using a bioprosthetic tissue valve with its potential for late structural valve deterioration and failure, depending upon the patient's longevity.  The patient understands and accepts all potential risks of surgery including but not limited to risk of death, stroke or other neurologic complication, myocardial infarction, congestive heart failure, respiratory failure, renal failure, bleeding requiring transfusion and/or reexploration, arrhythmia, infection or other wound complications, pneumonia, pleural and/or pericardial effusion, pulmonary embolus, aortic dissection or other major vascular complication, or delayed complications related to valve repair or replacement including but not limited to structural valve deterioration and failure, thrombosis, embolization, endocarditis, or paravalvular leak.  Alternative surgical approaches have been discussed including a comparison between conventional sternotomy and minimally-invasive techniques.  The relative risks and benefits of each have been reviewed as they pertain to the patient's specific circumstances, and all of their questions have been addressed.  Specific risks potentially related to the minimally-invasive approach were discussed at length, including but not limited to risk of conversion to full or partial sternotomy, aortic dissection or other major vascular complication, unilateral acute lung injury or pulmonary edema, phrenic nerve dysfunction or paralysis, rib fracture, chronic pain, lung hernia, or lymphocele. All of their questions have been answered.  We tentatively plan to proceed with surgery on Thursday, 07/08/2015. The patient will be referred for dental service consultation to be cleared prior to surgery. The patient will additionally undergo CT angiography to evaluate the potential feasibility of peripheral cannulation for surgery. The patient has been instructed to stop taking  metformin 2 days prior to her CT angiogram. The patient has been given a prescription for amiodarone to begin 1 week prior to surgery. The patient will return for follow-up on Tuesday, 07/06/2015.   I spent in excess of 90 minutes during the conduct of this office consultation and >50% of this time involved direct face-to-face encounter with the patient for counseling and/or coordination of their care.    [name], MD 06/23/2015 12:35 PM

## 2015-06-23 NOTE — Patient Instructions (Addendum)
The patient should continue all previous medications without changes at this time  Schedule appointment with Dr Lawana Chambers in the dental clinic as soon as practical  Schedule CT angiogram as soon as practical.  Stop taking metformin 2 days before your CT angiogram and restart taking it after the test has been done  Begin taking amiodarone on August 4th (1 week before your surgery)

## 2015-06-25 ENCOUNTER — Encounter: Payer: Self-pay | Admitting: Nurse Practitioner

## 2015-06-25 ENCOUNTER — Ambulatory Visit (INDEPENDENT_AMBULATORY_CARE_PROVIDER_SITE_OTHER): Payer: BLUE CROSS/BLUE SHIELD | Admitting: Nurse Practitioner

## 2015-06-25 ENCOUNTER — Other Ambulatory Visit: Payer: BLUE CROSS/BLUE SHIELD

## 2015-06-25 ENCOUNTER — Other Ambulatory Visit (INDEPENDENT_AMBULATORY_CARE_PROVIDER_SITE_OTHER): Payer: BLUE CROSS/BLUE SHIELD | Admitting: *Deleted

## 2015-06-25 ENCOUNTER — Telehealth: Payer: Self-pay | Admitting: *Deleted

## 2015-06-25 VITALS — BP 116/70 | HR 83 | Ht 59.5 in | Wt 186.0 lb

## 2015-06-25 DIAGNOSIS — I10 Essential (primary) hypertension: Secondary | ICD-10-CM | POA: Diagnosis not present

## 2015-06-25 DIAGNOSIS — E876 Hypokalemia: Secondary | ICD-10-CM

## 2015-06-25 DIAGNOSIS — I340 Nonrheumatic mitral (valve) insufficiency: Secondary | ICD-10-CM | POA: Diagnosis not present

## 2015-06-25 DIAGNOSIS — I5032 Chronic diastolic (congestive) heart failure: Secondary | ICD-10-CM

## 2015-06-25 DIAGNOSIS — N184 Chronic kidney disease, stage 4 (severe): Secondary | ICD-10-CM | POA: Diagnosis not present

## 2015-06-25 LAB — BASIC METABOLIC PANEL
BUN: 23 mg/dL (ref 6–23)
CO2: 28 mEq/L (ref 19–32)
Calcium: 9.8 mg/dL (ref 8.4–10.5)
Chloride: 103 mEq/L (ref 96–112)
Creatinine, Ser: 1.84 mg/dL — ABNORMAL HIGH (ref 0.40–1.20)
GFR: 35.95 mL/min — ABNORMAL LOW (ref >60.00)
Glucose, Bld: 90 mg/dL (ref 70–99)
Potassium: 3.6 mEq/L (ref 3.5–5.1)
Sodium: 140 mEq/L (ref 135–145)

## 2015-06-25 NOTE — Progress Notes (Signed)
Patient Name: Jody Nguyen Date of Encounter: 06/25/2015  Primary Care Provider:  Alesia Richards, MD Primary Cardiologist:  B. Stanford Breed, MD   Chief Complaint  59 year old female with severe mitral regurgitation who presents today for follow-up after recent catheterization.  Past Medical History   Past Medical History  Diagnosis Date  . HLD (hyperlipidemia)   . Vitamin D deficiency   . DM (diabetes mellitus)   . Hypertension     malignant; Renal Art [place] 11/13: no RAS  . CVA (cerebral infarction) 1997    no residual deficit  . Anemia   . CKD (chronic kidney disease) stage 4, GFR 15-29 ml/min   . Diverticulitis   . Chronic diastolic CHF (congestive heart failure)   . History of cardiovascular stress test     a. Myoview Oct 2012 showed EF 49%, no ischemia, LVE  . History of MRI 12.2015    cardiac MRI showed ejection fraction 61%, moderate left ventricular hypertrophy, large pericardial effusion and mildly reduced RV function  . Pericardial effusion     chronic; felt to be poss related to minoxidil >> DC'd  . S/P cardiac catheterization     a. R/L HC 06/18/15:  mLAD 30%; severe pulmo HTN with PA sat 43%, CI 1.86, prominent V waves indicative of MR; resting hypoxemia O2 sat 86% on RA  . Pulmonary hypertension 06/18/2015  . Severe mitral regurgitation   . Morbid obesity (BMI 36.05)  03/29/2015  . Chronic diastolic heart failure    Past Surgical History  Procedure Laterality Date  . Cesarean section      x 2  . Tee without cardioversion N/A 06/08/2015    Procedure: TRANSESOPHAGEAL ECHOCARDIOGRAM (TEE);  Surgeon: Lelon Perla, MD;  Location: [facility] ENDOSCOPY;  Service: Cardiovascular;  Laterality: N/A;  . Cardiac catheterization    . Cardiac catheterization N/A 06/18/2015    Procedure: Right/Left Heart Cath and Coronary Angiography;  Surgeon: Jettie Booze, MD;  Location: [facility];  Service: Cardiovascular;  Laterality: N/A;    Allergies  Allergies    Allergen Reactions  . Minoxidil Other (See Comments)    Pericardial effusion  . Lipitor [Atorvastatin] Other (See Comments)    REACTION: "pain in legs" Tolerates rosuvastatin  . Ace Inhibitors Other (See Comments)    REACTION: "not sure...think it made me drowsy all the time"    HPI  59 year old female with the above complex problem list. She has a history of diastolic heart failure, diabetes, hypertension, hyperlipidemia, stage IV chronic kidney disease, pericardial effusion, and severe mitral regurgitation. She recently underwent TEE showing normal LV function with a flail segment of the anterior mitral valve leaflet (A2) with severe eccentric mitral regurgitation. She was subsequent recently seen in the office and then underwent right and left heart cardiac catheterization revealing nonobstructive coronary artery disease and prominent V waves on right heart hemodynamic measurements. She was observed overnight for IV diuresis and subsequently discharged the following morning. She has been seen by thoracic surgery with tentative plans for mitral valve repair on August 11. She is a dental surgery visit planned for next week.   Since her catheterization, she has done well. She has stable two-pillow orthopnea. She denies dyspnea on exertion, chest pain, PND, dizziness, syncopal, edema, or early satiety.  Home Medications  Prior to Admission medications   Medication Sig Start Date End Date Taking? Authorizing Provider  amiodarone (PACERONE) 200 MG tablet Take 1 tablet (200 mg total) by mouth 2 (two) times daily  with a meal. Patient taking differently: Take 200 mg by mouth 2 (two) times daily with a meal. Starts this medication on 07/01/15 06/23/15  Yes Rexene Alberts, MD  aspirin 81 MG tablet Take 81 mg by mouth daily.     Yes Historical Provider, MD  canagliflozin (INVOKANA) 100 MG TABS tablet Take 1 tablet (100 mg total) by mouth daily. 06/21/15  Yes Liliane Shi, PA-C  Cholecalciferol  (VITAMIN D-3) 5000 UNITS TABS Take 5,000 Units by mouth daily.    Yes Historical Provider, MD  cloNIDine (CATAPRES - DOSED IN MG/24 HR) 0.2 mg/24hr patch Place 1 patch (0.2 mg total) onto the skin every 7 (seven) days. 03/22/15  Yes Vicie Mutters, PA-C  cyclobenzaprine (FLEXERIL) 10 MG tablet Take 1 tablet (10 mg total) by mouth 2 (two) times daily as needed for muscle spasms. 11/15/14  Yes Britt Bottom, NP  furosemide (LASIX) 40 MG tablet Take 40 mg by mouth 2 (two) times daily. 06/01/15  Yes Historical Provider, MD  hydrALAZINE (APRESOLINE) 100 MG tablet Take 1 tablet (100 mg total) by mouth every 8 (eight) hours. 06/14/15  Yes Unk Pinto, MD  insulin aspart protamine - aspart (NOVOLOG MIX 70/30 FLEXPEN) (70-30) 100 UNIT/ML FlexPen Inject 0.1 mLs (10 Units total) into the skin 2 (two) times daily. 06/19/15  Yes Liliane Shi, PA-C  labetalol (NORMODYNE) 200 MG tablet Take 200 mg by mouth 2 (two) times daily.   Yes Historical Provider, MD  losartan (COZAAR) 50 MG tablet Take 1 tablet (50 mg total) by mouth 2 (two) times daily. 06/20/15  Yes Liliane Shi, PA-C  Magnesium 250 MG TABS Take 250 mg by mouth daily.    Yes Historical Provider, MD  metFORMIN (GLUCOPHAGE-XR) 500 MG 24 hr tablet Take 1 tablet (500 mg total) by mouth daily. 06/21/15  Yes Liliane Shi, PA-C  potassium chloride SA (K-DUR,KLOR-CON) 20 MEQ tablet Take 2 tablets (40 mEq total) by mouth daily. 06/21/15  Yes Liliane Shi, PA-C  rosuvastatin (CRESTOR) 20 MG tablet Take 20 mg by mouth daily.   Yes Historical Provider, MD    Review of Systems   as above, she has stable two-pillow orthopnea.  She denies chest pain, palpitations, dyspnea, pnd, n, v, dizziness, syncope, edema, weight gain, or early satiety. All other systems reviewed and are otherwise negative except as noted above.  Physical Exam  VS:  BP 116/70 mmHg  Pulse 83  Ht 4' 11.5" (1.511 m)  Wt 186 lb (84.369 kg)  BMI 36.95 kg/m2 , BMI Body mass index is 36.95  kg/(m^2). GEN: Well nourished, well developed, in no acute distress. HEENT: normal. Neck: Supple, no JVD, carotid bruits, or masses. Cardiac: RRR,  somewhat distant heart sounds with a 3/6 systolic murmur at the apex radiating to the axilla, no  rubs, or gallops. No clubbing, cyanosis, edema.  left groin cath site without bleeding, bruit, or hematoma.  Radials/DP/PT 2+ and equal bilaterally.  Respiratory:  Respirations regular and unlabored, clear to auscultation bilaterally. GI: Soft, nontender, nondistended, BS + x 4. MS: no deformity or atrophy. Skin: warm and dry, no rash. Neuro:  Strength and sensation are intact. Psych: Normal affect.  Accessory Clinical Findings  ECG - regular sinus rhythm, 83, left atrial enlargement, nonspecific T changes. Prolonged QT.  Assessment & Plan  1.  Severe mitral regurgitation: Patient is status post recent right and left heart cardiac catheterization revealing nonobstructive CAD with evidence of severe mitral regurgitation. She has done well since  her catheterization and is pending mitral valve repair on August 11. She has a dental surgery consult next week.  2. Chronic diastolic congestive heart failure: Her weight has been stable at home and she has not been experiencing significant dyspnea or edema. Her heart rate and blood pressure well controlled and she remains on multiple antihypertensives as well as Lasix 40 mg twice a day. She is scheduled for a repeat basic metabolic panel today in the setting of stage IV chronic kidney disease with recent elevation of creatinine to 1.76 on July 25.  3. Essential hypertension: Stable on multiple medications.  4. Hyperlipidemia: Continue statin therapy.  5. Stage IV chronic kidney disease: Follow-up basic metabolic panel today. Post cath creatinine was 1.76 on July 25 which was up from precath creatinine.  6. Insulin-dependent diabetes mellitus: This is followed closely by primary care and she remains on  NovoLog as well as Glucophage.  7. Morbid Obesity:  She will greatly benefit from cardiac rehab and nutrition counseling following MVR.  8.  Disposition: Follow-up basic metabolic panel today. She scheduled for surgery August 11. We will arrange for follow-up in approximately one month with Dr. Stanford Breed.   Murray Hodgkins, NP 06/25/2015, 8:25 AM

## 2015-06-25 NOTE — Telephone Encounter (Signed)
Pt notified of her lab results and her creatinine level is ok and to continue with the current tx plan.  Pt verbalized understanding. jkw 06-25-15

## 2015-06-25 NOTE — Addendum Note (Signed)
Addended by: Eulis Foster on: 06/25/2015 07:48 AM   Modules accepted: Orders

## 2015-06-25 NOTE — Patient Instructions (Signed)
Medication Instructions:  None  Labwork: None  Testing/Procedures: None  Follow-Up: Your physician recommends that you schedule a follow-up appointment in: 1 month with Dr. Stanford Breed or Richardson Dopp, PA-C.   Any Other Special Instructions Will Be Listed Below (If Applicable).

## 2015-06-28 ENCOUNTER — Ambulatory Visit (HOSPITAL_COMMUNITY): Payer: Self-pay | Admitting: Dentistry

## 2015-06-28 ENCOUNTER — Encounter (HOSPITAL_COMMUNITY): Payer: Self-pay | Admitting: Dentistry

## 2015-06-28 VITALS — BP 150/76 | HR 85 | Temp 98.8°F

## 2015-06-28 DIAGNOSIS — K0530 Chronic periodontitis, unspecified: Secondary | ICD-10-CM

## 2015-06-28 DIAGNOSIS — M2632 Excessive spacing of fully erupted teeth: Secondary | ICD-10-CM

## 2015-06-28 DIAGNOSIS — K029 Dental caries, unspecified: Secondary | ICD-10-CM

## 2015-06-28 DIAGNOSIS — I340 Nonrheumatic mitral (valve) insufficiency: Secondary | ICD-10-CM

## 2015-06-28 DIAGNOSIS — IMO0002 Reserved for concepts with insufficient information to code with codable children: Secondary | ICD-10-CM

## 2015-06-28 DIAGNOSIS — K0889 Other specified disorders of teeth and supporting structures: Secondary | ICD-10-CM

## 2015-06-28 DIAGNOSIS — K045 Chronic apical periodontitis: Secondary | ICD-10-CM

## 2015-06-28 DIAGNOSIS — K083 Retained dental root: Secondary | ICD-10-CM

## 2015-06-28 DIAGNOSIS — K08409 Partial loss of teeth, unspecified cause, unspecified class: Secondary | ICD-10-CM

## 2015-06-28 DIAGNOSIS — M264 Malocclusion, unspecified: Secondary | ICD-10-CM

## 2015-06-28 DIAGNOSIS — Z01818 Encounter for other preprocedural examination: Secondary | ICD-10-CM | POA: Diagnosis not present

## 2015-06-28 DIAGNOSIS — K036 Deposits [accretions] on teeth: Secondary | ICD-10-CM

## 2015-06-28 NOTE — Progress Notes (Signed)
DENTAL CONSULTATION  Date of Consultation:  06/28/2015 Patient Name:   Jody Nguyen Date of Birth:   1956/10/03 Medical Record Number: [id number]  VITALS: BP 150/76 mmHg  Pulse 85  Temp(Src) 98.8 F (37.1 C) (Oral)  CHIEF COMPLAINT: Patient referred by Dr. Roxy Manns for a pre-heart valve surgery dental protocol examination.  HPI: Jody Nguyen is a 59 year old female recently diagnosed with severe mitral regurgitation. Patient with anticipated mitral valve replacement or repair. Patient now seen as part of a medically necessary pre-heart valve surgery dental protocol examination.  The patient currently denies acute toothaches, swellings, or abscesses. Patient has no primary dentist. Patient has not been seen by a Dentist for over 10 years. Patient saw Dr. Mathews Robinsons at that time. Patient denies having any partial dentures.  PROBLEM LIST: Patient Active Problem List   Diagnosis Date Noted  . Severe mitral regurgitation 05/31/2015    Priority: Medium  . Chronic diastolic heart failure   . Hypokalemia 06/19/2015  . Pulmonary hypertension 06/18/2015  . IDDM (insulin dependent diabetes mellitus) 05/31/2015  . CKD (chronic kidney disease) stage 4, GFR 15-29 ml/min 05/31/2015  . Elevated d-dimer 05/31/2015  . Pericardial effusion 04/30/2015  . Morbid obesity (BMI 36.05)  03/29/2015  . Acute on chronic diastolic CHF (congestive heart failure) 11/22/2014  . T1_IDDM  w/CKD4 (GFR 35 ml/min) 11/22/2014  . Vitamin D deficiency 03/25/2014  . Medication management 03/25/2014  . Cystocele 03/21/2012  . Anemia in chronic kidney disease   . Hypertension 08/17/2011  . Hyperlipidemia 08/17/2011    PMH: Past Medical History  Diagnosis Date  . HLD (hyperlipidemia)   . Vitamin D deficiency   . DM (diabetes mellitus)   . Hypertension     malignant; Renal Art [place] 11/13: no RAS  . CVA (cerebral infarction) 1997    no residual deficit  . Anemia   . CKD (chronic kidney disease) stage 4, GFR  15-29 ml/min   . Diverticulitis   . Chronic diastolic CHF (congestive heart failure)   . History of cardiovascular stress test     a. Myoview Oct 2012 showed EF 49%, no ischemia, LVE  . History of MRI 12.2015    cardiac MRI showed ejection fraction 61%, moderate left ventricular hypertrophy, large pericardial effusion and mildly reduced RV function  . Pericardial effusion     chronic; felt to be poss related to minoxidil >> DC'd  . S/P cardiac catheterization     a. R/L HC 06/18/15:  mLAD 30%; severe pulmo HTN with PA sat 43%, CI 1.86, prominent V waves indicative of MR; resting hypoxemia O2 sat 86% on RA  . Pulmonary hypertension 06/18/2015  . Severe mitral regurgitation   . Morbid obesity (BMI 36.05)  03/29/2015  . Chronic diastolic heart failure     PSH: Past Surgical History  Procedure Laterality Date  . Cesarean section      x 2  . Tee without cardioversion N/A 06/08/2015    Procedure: TRANSESOPHAGEAL ECHOCARDIOGRAM (TEE);  Surgeon: Lelon Perla, MD;  Location: [facility] ENDOSCOPY;  Service: Cardiovascular;  Laterality: N/A;  . Cardiac catheterization    . Cardiac catheterization N/A 06/18/2015    Procedure: Right/Left Heart Cath and Coronary Angiography;  Surgeon: Jettie Booze, MD;  Location: [place] [facility];  Service: Cardiovascular;  Laterality: N/A;    ALLERGIES: Allergies  Allergen Reactions  . Minoxidil Other (See Comments)    Pericardial effusion  . Lipitor [Atorvastatin] Other (See Comments)    REACTION: "pain in  legs" Tolerates rosuvastatin  . Ace Inhibitors Other (See Comments)    REACTION: "not sure...think it made me drowsy all the time"    MEDICATIONS: Current Outpatient Prescriptions  Medication Sig Dispense Refill  . amiodarone (PACERONE) 200 MG tablet Take 1 tablet (200 mg total) by mouth 2 (two) times daily with a meal. (Patient taking differently: Take 200 mg by mouth 2 (two) times daily with a meal. Starts this medication on 07/01/15) 60 tablet 0  .  aspirin 81 MG tablet Take 81 mg by mouth daily.      . canagliflozin (INVOKANA) 100 MG TABS tablet Take 1 tablet (100 mg total) by mouth daily. 90 tablet 3  . Cholecalciferol (VITAMIN D-3) 5000 UNITS TABS Take 5,000 Units by mouth daily.     . cloNIDine (CATAPRES - DOSED IN MG/24 HR) 0.2 mg/24hr patch Place 1 patch (0.2 mg total) onto the skin every 7 (seven) days. 4 patch 2  . cyclobenzaprine (FLEXERIL) 10 MG tablet Take 1 tablet (10 mg total) by mouth 2 (two) times daily as needed for muscle spasms. 20 tablet 0  . furosemide (LASIX) 40 MG tablet Take 40 mg by mouth 2 (two) times daily.  5  . hydrALAZINE (APRESOLINE) 100 MG tablet Take 1 tablet (100 mg total) by mouth every 8 (eight) hours. 90 tablet 0  . insulin aspart protamine - aspart (NOVOLOG MIX 70/30 FLEXPEN) (70-30) 100 UNIT/ML FlexPen Inject 0.1 mLs (10 Units total) into the skin 2 (two) times daily. 15 mL 11  . labetalol (NORMODYNE) 200 MG tablet Take 200 mg by mouth 2 (two) times daily.    Marland Kitchen losartan (COZAAR) 50 MG tablet Take 1 tablet (50 mg total) by mouth 2 (two) times daily. 180 tablet 0  . Magnesium 250 MG TABS Take 250 mg by mouth daily.     . metFORMIN (GLUCOPHAGE-XR) 500 MG 24 hr tablet Take 1 tablet (500 mg total) by mouth daily.  2  . potassium chloride SA (K-DUR,KLOR-CON) 20 MEQ tablet Take 2 tablets (40 mEq total) by mouth daily. 60 tablet 11  . rosuvastatin (CRESTOR) 20 MG tablet Take 20 mg by mouth daily.     No current facility-administered medications for this visit.    LABS: Lab Results  Component Value Date   WBC 4.3 06/14/2015   HGB 9.9* 06/14/2015   HCT 30.8* 06/14/2015   MCV 81.8 06/14/2015   PLT 247.0 06/14/2015   BMP Latest Ref Rng 06/25/2015 06/21/2015 06/19/2015  Glucose 70 - 99 mg/dL 90 119(H) 102(H)  BUN 6 - 23 mg/dL 23 25(H) 21(H)  Creatinine 0.40 - 1.20 mg/dL 1.84(H) 1.76(H) 1.40(H)  Sodium 135 - 145 mEq/L 140 138 137  Potassium 3.5 - 5.1 mEq/L 3.6 3.3(L) 3.4(L)  Chloride 96 - 112 mEq/L 103 103  104  CO2 19 - 32 mEq/L 28 26 23   Calcium 8.4 - 10.5 mg/dL 9.8 9.5 9.2    Lab Results  Component Value Date   INR 1.0 06/14/2015   No results found for: PTT  SOCIAL HISTORY: History   Social History  . Marital Status: Married    Spouse Name: N/A  . Number of Children: 3  . Years of Education: N/A   Occupational History  .      Unemployed; used to work as a Radio broadcast assistant   Social History Main Topics  . Smoking status: Never Smoker   . Smokeless tobacco: Never Used  . Alcohol Use: No  . Drug Use: No  .  Sexual Activity: Yes    Birth Control/ Protection: None   Other Topics Concern  . Not on file   Social History Narrative    FAMILY HISTORY: Family History  Problem Relation Age of Onset  . Heart attack Brother     MI in his 3s  . Cancer Sister     breast cancer  . Breast cancer Sister   . Stroke Mother     REVIEW OF SYSTEMS: Reviewed with the patient included in dental record.  DENTAL HISTORY: CHIEF COMPLAINT: Patient referred by Dr. Roxy Manns for a pre-heart valve surgery dental protocol examination.  HPI: MALEAH TREICHLER is a 59 year old female recently diagnosed with severe mitral regurgitation. Patient with anticipated mitral valve replacement or repair. Patient now seen as part of a medically necessary pre-heart valve surgery dental protocol examination.  The patient currently denies acute toothaches, swellings, or abscesses. Patient has no primary dentist. Patient has not been seen by a Dentist for over 10 years. Patient saw Dr. Mathews Robinsons at that time. Patient denies having any partial dentures.   DENTAL EXAMINATION: GENERAL: The patient is a well-developed, well-nourished female in no acute distress. HEAD AND NECK: There is no palpable submandibular lymphadenopathy. The patient denies acute TMJ symptoms. INTRAORAL EXAM: Patient has normal saliva. Patient has a small mandibular right lingual torus. There is no evidence of abscess  formation. DENTITION: Patient is missing tooth numbers 1, 15, 16, 17, and 32. Tooth #30 is present as retained root segments. PERIODONTAL: Patient has chronic periodontitis with plaque and calculus accumulations, generalized gingival recession, and tooth mobility as charted. DENTAL CARIES/SUBOPTIMAL RESTORATIONS: Dental caries are noted. Patient has a flexure lesion associated with tooth #4. ENDODONTIC: Patient currently denies acute pulpitis symptoms. Patient does have periapical pathology associated with tooth #30. CROWN AND BRIDGE: Patient has multiple crown restorations on tooth numbers 4 and 14. Patient has a flexure lesion on the facial of tooth #4 that undermines the margin. PROSTHODONTIC: Patient denies having any partial dentures. OCCLUSION: Patient has a poor occlusal scheme secondary to multiple missing teeth, anterior open bite, and presence of multiple diastemas.   RADIOGRAPHIC INTERPRETATION:  An orthopantogram was taken and supplemented with a full series of dental radiographs. There are multiple missing teeth. There is a retained root segment in the area #30. Dental caries are noted. There is periapical radiolucency associated with the apex of tooth #30. Multiple diastemas are noted. There are previous root canal therapies associated with tooth numbers 4 and 14. Multiple crown restorations are noted. Multiple amalgam and resin restorations are noted.  ASSESSMENTS: 1. Severe mitral regurgitation 2. Pre-heart valve surgery dental protocol 3. Chronic apical periodontitis 4. Dental caries 5. Retained root segment #30 6. Chronic periodontitis with bone loss 7. Gingival recession 8. Tooth mobility 9. Multiple missing teeth 10. Supra-eruption and drifting of the unopposed teeth into the edentulous areas 11. Anterior open bite 12. Malocclusion 13. Abfraction lesion 14. Mandibular right lingual torus-small 15. Multiple diastemas 16. Risks for complications up to and including  death with anticipated invasive dental procedures due to overall cardiovascular compromise   PLAN/RECOMMENDATIONS: 1. I discussed the risks, benefits, and complications of various treatment options with the patient in relationship to her medical and dental conditions, anticipated heart valve surgery, and risk for endocarditis. We discussed various treatment options to include no treatment, multiple extractions with alveoloplasty, pre-prosthetic surgery as indicated, periodontal therapy, dental restorations, root canal therapy, crown and bridge therapy, implant therapy, and replacement of missing teeth as indicated. The  patient currently wishes to proceed with  extraction of tooth numbers 4 and 30 with alveoloplasty along with gross debridement of remaining dentition in the operating room with general anesthesia on 06/30/2015 at 8:30 AM at [facility]. The patient will then proceed mitral valve replacement or repair with Dr. Roxy Manns on 07/08/2015 barring and complications from the dental procedures. Patient will also follow-up with the general dentist of her choice for routine periodontal maintenance and evaluation for replacement of missing teeth is indicated after adequate healing from dental extractions and once medically stable from the anticipated mitral valve replacement.   2. Discussion of findings with medical team and coordination of future medical and dental care as needed.  I spent in excess of  120 minutes during the conduct of this consultation and >50% of this time involved direct face-to-face encounter for counseling and/or coordination of the patient's care.    Lenn Cal, DDS

## 2015-06-28 NOTE — Patient Instructions (Signed)
[place]    Department of Dental Medicine     DR. Christabelle Hanzlik      HEART VALVES AND MOUTH CARE:  FACTS:   If you have any infection in your mouth, it can infect your heart valve.  If you heart valve is infected, you will be seriously ill.  Infections in the mouth can be SILENT and do not always cause pain.  Examples of infections in the mouth are gum disease, dental cavities, and abscesses.  Some possible signs of infection are: Bad breath, bleeding gums, or teeth that are sensitive to sweets, hot, and/or cold. There are many other signs as well.  WHAT YOU HAVE TO DO:   Brush your teeth after meals and at bedtime. Spend at least 2 minutes brushing well, especially behind your back teeth and all around your teeth that stand alone. Brush at the gumline also.  Do not go to bed without brushing your teeth and flossing.  If you gums bleed when you brush or floss, do NOT stop brushing or flossing. It usually means that your gums need more attention and better cleaning.   If your Dentist or Dr. Lasalle Abee gave you a prescription mouthwash to use, make sure to use it as directed. If you run out of the medication, get a refill at the pharmacy.   If you were given any other medications or directions by your Dentist, please follow them. If you did not understand the directions or forget what you were told, please call. We will be happy to refresh her memory.  If you need antibiotics before dental procedures, make sure you take them one hour prior to every dental visit as directed.   Get a dental checkup every 4-6 months in order to keep your mouth healthy, or to find and treat any new infection. You will most likely need your teeth cleaned or gums treated at the same time.  If you are not able to come in for your scheduled appointment, call your Dentist as soon as possible to reschedule.  If you have a problem in between dental visits, call your Dentist.  

## 2015-06-29 ENCOUNTER — Ambulatory Visit (INDEPENDENT_AMBULATORY_CARE_PROVIDER_SITE_OTHER): Payer: BLUE CROSS/BLUE SHIELD | Admitting: Internal Medicine

## 2015-06-29 ENCOUNTER — Encounter (HOSPITAL_COMMUNITY): Payer: Self-pay | Admitting: *Deleted

## 2015-06-29 ENCOUNTER — Encounter: Payer: Self-pay | Admitting: Internal Medicine

## 2015-06-29 VITALS — BP 178/92 | HR 90 | Temp 98.2°F | Resp 16 | Ht 60.0 in | Wt 184.0 lb

## 2015-06-29 DIAGNOSIS — Z79899 Other long term (current) drug therapy: Secondary | ICD-10-CM | POA: Diagnosis not present

## 2015-06-29 DIAGNOSIS — E785 Hyperlipidemia, unspecified: Secondary | ICD-10-CM

## 2015-06-29 DIAGNOSIS — E1029 Type 1 diabetes mellitus with other diabetic kidney complication: Secondary | ICD-10-CM

## 2015-06-29 DIAGNOSIS — I10 Essential (primary) hypertension: Secondary | ICD-10-CM | POA: Diagnosis not present

## 2015-06-29 DIAGNOSIS — I340 Nonrheumatic mitral (valve) insufficiency: Secondary | ICD-10-CM | POA: Diagnosis not present

## 2015-06-29 DIAGNOSIS — E559 Vitamin D deficiency, unspecified: Secondary | ICD-10-CM | POA: Diagnosis not present

## 2015-06-29 LAB — LIPID PANEL
Cholesterol: 135 mg/dL (ref 125–200)
HDL: 53 mg/dL (ref >=46)
LDL Cholesterol: 63 mg/dL (ref <130)
Total CHOL/HDL Ratio: 2.5 Ratio (ref <=5.0)
Triglycerides: 97 mg/dL (ref <150)
VLDL: 19 mg/dL (ref <30)

## 2015-06-29 LAB — CBC WITH DIFFERENTIAL/PLATELET
Basophils Absolute: 0 10*3/uL (ref 0.0–0.1)
Basophils Relative: 1 % (ref 0–1)
Eosinophils Absolute: 0.2 10*3/uL (ref 0.0–0.7)
Eosinophils Relative: 4 % (ref 0–5)
HCT: 30.1 % — ABNORMAL LOW (ref 36.0–46.0)
Hemoglobin: 9.7 g/dL — ABNORMAL LOW (ref 12.0–15.0)
Lymphocytes Relative: 30 % (ref 12–46)
Lymphs Abs: 1.3 10*3/uL (ref 0.7–4.0)
MCH: 25.6 pg — ABNORMAL LOW (ref 26.0–34.0)
MCHC: 32.2 g/dL (ref 30.0–36.0)
MCV: 79.4 fL (ref 78.0–100.0)
MPV: 9.9 fL (ref 8.6–12.4)
Monocytes Absolute: 0.5 10*3/uL (ref 0.1–1.0)
Monocytes Relative: 13 % — ABNORMAL HIGH (ref 3–12)
Neutro Abs: 2.2 10*3/uL (ref 1.7–7.7)
Neutrophils Relative %: 52 % (ref 43–77)
Platelets: 329 10*3/uL (ref 150–400)
RBC: 3.79 MIL/uL — ABNORMAL LOW (ref 3.87–5.11)
RDW: 16.3 % — ABNORMAL HIGH (ref 11.5–15.5)
WBC: 4.2 10*3/uL (ref 4.0–10.5)

## 2015-06-29 LAB — BASIC METABOLIC PANEL WITH GFR
BUN: 27 mg/dL — ABNORMAL HIGH (ref 7–25)
CO2: 28 mmol/L (ref 20–31)
Calcium: 10 mg/dL (ref 8.6–10.4)
Chloride: 102 mmol/L (ref 98–110)
Creat: 1.88 mg/dL — ABNORMAL HIGH (ref 0.50–0.99)
GFR, Est African American: 33 mL/min — ABNORMAL LOW (ref >=60)
GFR, Est Non African American: 29 mL/min — ABNORMAL LOW (ref >=60)
Glucose, Bld: 99 mg/dL (ref 65–99)
Potassium: 3.7 mmol/L (ref 3.5–5.3)
Sodium: 142 mmol/L (ref 135–146)

## 2015-06-29 LAB — HEPATIC FUNCTION PANEL
ALT: 10 U/L (ref 6–29)
AST: 14 U/L (ref 10–35)
Albumin: 4.3 g/dL (ref 3.6–5.1)
Alkaline Phosphatase: 44 U/L (ref 33–130)
Bilirubin, Direct: 0.1 mg/dL (ref <=0.2)
Indirect Bilirubin: 0.6 mg/dL (ref 0.2–1.2)
Total Bilirubin: 0.7 mg/dL (ref 0.2–1.2)
Total Protein: 7.5 g/dL (ref 6.1–8.1)

## 2015-06-29 LAB — TSH: TSH: 3.187 u[IU]/mL (ref 0.350–4.500)

## 2015-06-29 LAB — HEMOGLOBIN A1C
Hgb A1c MFr Bld: 6 % — ABNORMAL HIGH (ref <5.7)
Mean Plasma Glucose: 126 mg/dL — ABNORMAL HIGH (ref <117)

## 2015-06-29 LAB — MAGNESIUM: Magnesium: 2 mg/dL (ref 1.5–2.5)

## 2015-06-29 NOTE — Progress Notes (Signed)
Jody Nguyen denies chest pain, does have shortness of breath.  Patient states that she had a sleep study a few years ago, but never heard anything from it. Patient does not know where she had it.

## 2015-06-29 NOTE — Progress Notes (Signed)
Assessment and Plan:  Hypertension:  -continued lability? Will defer to cardiology for further management as surgery is impending. -Continue medication -monitor blood pressure at home. -Continue DASH diet -Reminder to go to the ER if any CP, SOB, nausea, dizziness, severe HA, changes vision/speech, left arm numbness and tingling and jaw pain.  Cholesterol -not currently at goal consider adding in zetia after surgery - Continue diet and exercise -Check cholesterol.   Diabetes with diabetic chronic kidney disease -Continue diet and exercise.  -Check A1C  Vitamin D Def -check level -continue medications.   Mitral Regurge -having mitral valve replacement next week with Dr. Roxy Manns.  Will forward labs to Dr. Roxy Manns    Continue diet and meds as discussed. Further disposition pending results of labs. Discussed med's effects and SE's.    HPI 59 y.o. female  presents for 3 month follow up with hypertension, hyperlipidemia, diabetes and vitamin D deficiency.   Her blood pressure has not been controlled at home, today their BP is BP: (!) 178/92 mmHg.She does not workout. She denies chest pain, shortness of breath, dizziness.  She reports that the blood pressure has been very labile lately.  She reports that she is taking her medications at the same time every day and she can't understand why her blood pressure has been so labile.     She is on cholesterol medication and denies myalgias. Her cholesterol is at goal. The cholesterol was:  03/29/2015: Cholesterol 190; HDL 67; LDL Cholesterol 104*; Triglycerides 94.  She is taking medication and is not having any issues taking it.  She reports that she has been on the crestor for the past two years.     She has been working on diet and exercise for diabetes with diabetic chronic kidney disease, she is on bASA, she is on ACE/ARB, and denies  foot ulcerations, hyperglycemia, hypoglycemia , increased appetite, nausea, paresthesia of the feet, polydipsia,  polyuria, visual disturbances, vomiting and weight loss. Last A1C was: 03/29/2015: Hgb A1c MFr Bld 5.7*.  She reports that she has been running around 120 first thing in the morning.     Patient is on Vitamin D supplement. 03/29/2015: Vit D, 25-Hydroxy 42  She reports that she is going to have surgery this week to repair her severe mitral regurge.     Current Medications:  Current Outpatient Prescriptions on File Prior to Visit  Medication Sig Dispense Refill  . amiodarone (PACERONE) 200 MG tablet Take 1 tablet (200 mg total) by mouth 2 (two) times daily with a meal. (Patient taking differently: Take 200 mg by mouth 2 (two) times daily with a meal. Starts this medication on 07/01/15) 60 tablet 0  . aspirin 81 MG tablet Take 81 mg by mouth daily.      . canagliflozin (INVOKANA) 100 MG TABS tablet Take 1 tablet (100 mg total) by mouth daily. 90 tablet 3  . Cholecalciferol (VITAMIN D-3) 5000 UNITS TABS Take 5,000 Units by mouth daily.     . cloNIDine (CATAPRES - DOSED IN MG/24 HR) 0.2 mg/24hr patch Place 1 patch (0.2 mg total) onto the skin every 7 (seven) days. 4 patch 2  . cyclobenzaprine (FLEXERIL) 10 MG tablet Take 1 tablet (10 mg total) by mouth 2 (two) times daily as needed for muscle spasms. 20 tablet 0  . furosemide (LASIX) 40 MG tablet Take 40 mg by mouth 2 (two) times daily.  5  . hydrALAZINE (APRESOLINE) 100 MG tablet Take 1 tablet (100 mg total) by mouth every  8 (eight) hours. 90 tablet 0  . insulin aspart protamine - aspart (NOVOLOG MIX 70/30 FLEXPEN) (70-30) 100 UNIT/ML FlexPen Inject 0.1 mLs (10 Units total) into the skin 2 (two) times daily. 15 mL 11  . labetalol (NORMODYNE) 200 MG tablet Take 200 mg by mouth 2 (two) times daily.    Marland Kitchen losartan (COZAAR) 50 MG tablet Take 1 tablet (50 mg total) by mouth 2 (two) times daily. 180 tablet 0  . Magnesium 250 MG TABS Take 250 mg by mouth daily.     . metFORMIN (GLUCOPHAGE-XR) 500 MG 24 hr tablet Take 1 tablet (500 mg total) by mouth daily.  2   . potassium chloride SA (K-DUR,KLOR-CON) 20 MEQ tablet Take 2 tablets (40 mEq total) by mouth daily. 60 tablet 11  . rosuvastatin (CRESTOR) 20 MG tablet Take 20 mg by mouth daily.     No current facility-administered medications on file prior to visit.   Medical History:  Past Medical History  Diagnosis Date  . HLD (hyperlipidemia)   . Vitamin D deficiency   . DM (diabetes mellitus)   . Hypertension     malignant; Renal Art [place] 11/13: no RAS  . CVA (cerebral infarction) 1997    no residual deficit  . Anemia   . CKD (chronic kidney disease) stage 4, GFR 15-29 ml/min   . Diverticulitis   . Chronic diastolic CHF (congestive heart failure)   . History of cardiovascular stress test     a. Myoview Oct 2012 showed EF 49%, no ischemia, LVE  . History of MRI 12.2015    cardiac MRI showed ejection fraction 61%, moderate left ventricular hypertrophy, large pericardial effusion and mildly reduced RV function  . Pericardial effusion     chronic; felt to be poss related to minoxidil >> DC'd  . S/P cardiac catheterization     a. R/L HC 06/18/15:  mLAD 30%; severe pulmo HTN with PA sat 43%, CI 1.86, prominent V waves indicative of MR; resting hypoxemia O2 sat 86% on RA  . Pulmonary hypertension 06/18/2015  . Severe mitral regurgitation   . Morbid obesity (BMI 36.05)  03/29/2015  . Chronic diastolic heart failure    Allergies:  Allergies  Allergen Reactions  . Minoxidil Other (See Comments)    Pericardial effusion  . Lipitor [Atorvastatin] Other (See Comments)    REACTION: "pain in legs" Tolerates rosuvastatin  . Ace Inhibitors Other (See Comments)    REACTION: "not sure...think it made me drowsy all the time"     Review of Systems:  Review of Systems  Constitutional: Negative for fever, chills and malaise/fatigue.  HENT: Negative for congestion, ear pain and sore throat.   Respiratory: Positive for shortness of breath. Negative for cough and wheezing.   Cardiovascular: Positive for  orthopnea and PND. Negative for chest pain, palpitations and leg swelling.  Gastrointestinal: Positive for constipation. Negative for heartburn, nausea, vomiting, diarrhea, blood in stool and melena.  Genitourinary: Negative.   Skin: Negative.   Neurological: Negative for dizziness, sensory change, loss of consciousness and headaches.  Psychiatric/Behavioral: Negative for depression. The patient has insomnia. The patient is not nervous/anxious.     Family history- Review and unchanged  Social history- Review and unchanged  Physical Exam: BP 178/92 mmHg  Pulse 90  Temp(Src) 98.2 F (36.8 C) (Temporal)  Resp 16  Ht 5' (1.524 m)  Wt 184 lb (83.462 kg)  BMI 35.94 kg/m2 Wt Readings from Last 3 Encounters:  06/29/15 184 lb (83.462 kg)  06/25/15  186 lb (84.369 kg)  06/23/15 185 lb (83.915 kg)   General Appearance: Well nourished well developed, non-toxic appearing, in no apparent distress. Eyes: PERRLA, EOMs, conjunctiva no swelling or erythema ENT/Mouth: Ear canals clear with no erythema, swelling, or discharge.  TMs normal bilaterally, oropharynx clear, moist, with no exudate.   Neck: Supple, thyroid normal, no JVD, no cervical adenopathy.  Respiratory: Respiratory effort normal, breath sounds clear A&P, no wheeze, rhonchi or rales noted.  No retractions, no accessory muscle usage Cardio: RRR with no 4/6 rumbling murmur heard best a the left sternal border.  No RGs. No noted edema.  Abdomen: Soft, + BS.  Non tender, no guarding, rebound, hernias, masses. Musculoskeletal: Full ROM, 5/5 strength, Normal gait Skin: Warm, dry without rashes, lesions, ecchymosis.  Neuro: Awake and oriented X 3, Cranial nerves intact. No cerebellar symptoms.  Psych: normal affect, Insight and Judgment appropriate.    Starlyn Skeans, PA-C 10:26 AM [facility]

## 2015-06-29 NOTE — Addendum Note (Signed)
Addended by: Starlyn Skeans A on: 06/29/2015 10:51 AM   Modules accepted: Orders

## 2015-06-30 ENCOUNTER — Other Ambulatory Visit: Payer: Self-pay | Admitting: *Deleted

## 2015-06-30 ENCOUNTER — Encounter (HOSPITAL_COMMUNITY): Payer: Self-pay | Admitting: Certified Registered Nurse Anesthetist

## 2015-06-30 ENCOUNTER — Encounter (HOSPITAL_COMMUNITY): Payer: Self-pay | Admitting: *Deleted

## 2015-06-30 ENCOUNTER — Ambulatory Visit (HOSPITAL_COMMUNITY)
Admission: RE | Admit: 2015-06-30 | Discharge: 2015-06-30 | Disposition: A | Discharge status: Home / Self Care | Payer: BLUE CROSS/BLUE SHIELD | Source: Ambulatory Visit | Attending: Dentistry | Admitting: Dentistry

## 2015-06-30 ENCOUNTER — Ambulatory Visit (HOSPITAL_COMMUNITY): Payer: BLUE CROSS/BLUE SHIELD

## 2015-06-30 ENCOUNTER — Ambulatory Visit (HOSPITAL_COMMUNITY): Payer: BLUE CROSS/BLUE SHIELD | Admitting: Anesthesiology

## 2015-06-30 ENCOUNTER — Encounter (HOSPITAL_COMMUNITY)
Admission: RE | Disposition: A | Discharge status: Home / Self Care | Payer: Self-pay | Source: Ambulatory Visit | Attending: Dentistry

## 2015-06-30 ENCOUNTER — Ambulatory Visit (HOSPITAL_COMMUNITY)
Admission: RE | Admit: 2015-06-30 | Discharge: 2015-06-30 | Disposition: A | Discharge status: Home / Self Care | Payer: BLUE CROSS/BLUE SHIELD | Source: Ambulatory Visit | Attending: Thoracic Surgery (Cardiothoracic Vascular Surgery) | Admitting: Thoracic Surgery (Cardiothoracic Vascular Surgery)

## 2015-06-30 ENCOUNTER — Emergency Department (HOSPITAL_COMMUNITY)
Admission: EM | Admit: 2015-06-30 | Discharge: 2015-06-30 | Disposition: A | Discharge status: Home / Self Care | Payer: BLUE CROSS/BLUE SHIELD | Source: Home / Self Care | Attending: Emergency Medicine | Admitting: Emergency Medicine

## 2015-06-30 DIAGNOSIS — E119 Type 2 diabetes mellitus without complications: Secondary | ICD-10-CM

## 2015-06-30 DIAGNOSIS — K0530 Chronic periodontitis, unspecified: Secondary | ICD-10-CM

## 2015-06-30 DIAGNOSIS — K088 Other specified disorders of teeth and supporting structures: Secondary | ICD-10-CM | POA: Insufficient documentation

## 2015-06-30 DIAGNOSIS — I509 Heart failure, unspecified: Secondary | ICD-10-CM | POA: Diagnosis not present

## 2015-06-30 DIAGNOSIS — Z8673 Personal history of transient ischemic attack (TIA), and cerebral infarction without residual deficits: Secondary | ICD-10-CM

## 2015-06-30 DIAGNOSIS — Z9889 Other specified postprocedural states: Secondary | ICD-10-CM

## 2015-06-30 DIAGNOSIS — K045 Chronic apical periodontitis: Secondary | ICD-10-CM | POA: Diagnosis present

## 2015-06-30 DIAGNOSIS — I129 Hypertensive chronic kidney disease with stage 1 through stage 4 chronic kidney disease, or unspecified chronic kidney disease: Secondary | ICD-10-CM

## 2015-06-30 DIAGNOSIS — I340 Nonrheumatic mitral (valve) insufficiency: Secondary | ICD-10-CM | POA: Diagnosis not present

## 2015-06-30 DIAGNOSIS — I7409 Other arterial embolism and thrombosis of abdominal aorta: Secondary | ICD-10-CM

## 2015-06-30 DIAGNOSIS — K068 Other specified disorders of gingiva and edentulous alveolar ridge: Secondary | ICD-10-CM

## 2015-06-30 DIAGNOSIS — N184 Chronic kidney disease, stage 4 (severe): Secondary | ICD-10-CM | POA: Insufficient documentation

## 2015-06-30 DIAGNOSIS — E559 Vitamin D deficiency, unspecified: Secondary | ICD-10-CM

## 2015-06-30 DIAGNOSIS — R011 Cardiac murmur, unspecified: Secondary | ICD-10-CM | POA: Insufficient documentation

## 2015-06-30 DIAGNOSIS — I10 Essential (primary) hypertension: Secondary | ICD-10-CM | POA: Insufficient documentation

## 2015-06-30 DIAGNOSIS — Z862 Personal history of diseases of the blood and blood-forming organs and certain disorders involving the immune mechanism: Secondary | ICD-10-CM | POA: Insufficient documentation

## 2015-06-30 DIAGNOSIS — Z794 Long term (current) use of insulin: Secondary | ICD-10-CM | POA: Insufficient documentation

## 2015-06-30 DIAGNOSIS — K083 Retained dental root: Secondary | ICD-10-CM

## 2015-06-30 DIAGNOSIS — E785 Hyperlipidemia, unspecified: Secondary | ICD-10-CM | POA: Insufficient documentation

## 2015-06-30 DIAGNOSIS — R0602 Shortness of breath: Secondary | ICD-10-CM | POA: Insufficient documentation

## 2015-06-30 DIAGNOSIS — I5032 Chronic diastolic (congestive) heart failure: Secondary | ICD-10-CM | POA: Insufficient documentation

## 2015-06-30 DIAGNOSIS — Z7982 Long term (current) use of aspirin: Secondary | ICD-10-CM | POA: Insufficient documentation

## 2015-06-30 DIAGNOSIS — I719 Aortic aneurysm of unspecified site, without rupture: Secondary | ICD-10-CM

## 2015-06-30 DIAGNOSIS — K036 Deposits [accretions] on teeth: Secondary | ICD-10-CM

## 2015-06-30 HISTORY — DX: Cardiac murmur, unspecified: R01.1

## 2015-06-30 HISTORY — PX: MULTIPLE EXTRACTIONS WITH ALVEOLOPLASTY: SHX5342

## 2015-06-30 HISTORY — DX: Reserved for inherently not codable concepts without codable children: IMO0001

## 2015-06-30 HISTORY — DX: Constipation, unspecified: K59.00

## 2015-06-30 LAB — POCT I-STAT 4, (NA,K, GLUC, HGB,HCT)
Glucose, Bld: 125 mg/dL — ABNORMAL HIGH (ref 65–99)
HCT: 35 % — ABNORMAL LOW (ref 36.0–46.0)
Hemoglobin: 11.9 g/dL — ABNORMAL LOW (ref 12.0–15.0)
Potassium: 3.4 mmol/L — ABNORMAL LOW (ref 3.5–5.1)
Sodium: 140 mmol/L (ref 135–145)

## 2015-06-30 LAB — GLUCOSE, CAPILLARY: Glucose-Capillary: 146 mg/dL — ABNORMAL HIGH (ref 65–99)

## 2015-06-30 LAB — VITAMIN D 25 HYDROXY (VIT D DEFICIENCY, FRACTURES): Vit D, 25-Hydroxy: 53 ng/mL (ref 30–100)

## 2015-06-30 LAB — INSULIN, RANDOM: Insulin: 13.6 u[IU]/mL (ref 2.0–19.6)

## 2015-06-30 SURGERY — MULTIPLE EXTRACTION WITH ALVEOLOPLASTY
Anesthesia: General

## 2015-06-30 MED ORDER — CEFAZOLIN SODIUM-DEXTROSE 2-3 GM-% IV SOLR
2.0000 g | Freq: Once | INTRAVENOUS | Status: AC
  Administered 2015-06-30: 2 g via INTRAVENOUS
  Filled 2015-06-30: qty 50

## 2015-06-30 MED ORDER — MIDAZOLAM HCL 2 MG/2ML IJ SOLN
INTRAMUSCULAR | Status: AC
  Filled 2015-06-30: qty 4

## 2015-06-30 MED ORDER — PROPOFOL 10 MG/ML IV BOLUS
INTRAVENOUS | Status: DC | PRN
  Administered 2015-06-30: 150 mg via INTRAVENOUS

## 2015-06-30 MED ORDER — LACTATED RINGERS IV SOLN
INTRAVENOUS | Status: DC | PRN
  Administered 2015-06-30: 08:00:00 via INTRAVENOUS

## 2015-06-30 MED ORDER — LIDOCAINE-EPINEPHRINE 2 %-1:100000 IJ SOLN
INTRAMUSCULAR | Status: DC | PRN
  Administered 2015-06-30: 3.6 mL via INTRADERMAL

## 2015-06-30 MED ORDER — PHENYLEPHRINE HCL 10 MG/ML IJ SOLN
INTRAMUSCULAR | Status: DC | PRN
  Administered 2015-06-30 (×3): 40 ug via INTRAVENOUS
  Administered 2015-06-30: 80 ug via INTRAVENOUS

## 2015-06-30 MED ORDER — PROPOFOL 10 MG/ML IV BOLUS
INTRAVENOUS | Status: AC
  Filled 2015-06-30: qty 20

## 2015-06-30 MED ORDER — HEMOSTATIC AGENTS (NO CHARGE) OPTIME
TOPICAL | Status: DC | PRN
  Administered 2015-06-30: 1 via TOPICAL

## 2015-06-30 MED ORDER — LIDOCAINE HCL (CARDIAC) 20 MG/ML IV SOLN
INTRAVENOUS | Status: DC | PRN
  Administered 2015-06-30: 40 mg via INTRAVENOUS

## 2015-06-30 MED ORDER — BUPIVACAINE-EPINEPHRINE (PF) 0.5% -1:200000 IJ SOLN
INTRAMUSCULAR | Status: AC
  Filled 2015-06-30: qty 3.6

## 2015-06-30 MED ORDER — OXYMETAZOLINE HCL 0.05 % NA SOLN
NASAL | Status: AC
  Filled 2015-06-30: qty 15

## 2015-06-30 MED ORDER — ALBUTEROL SULFATE HFA 108 (90 BASE) MCG/ACT IN AERS
INHALATION_SPRAY | RESPIRATORY_TRACT | Status: AC
  Filled 2015-06-30: qty 6.7

## 2015-06-30 MED ORDER — CEFAZOLIN SODIUM-DEXTROSE 2-3 GM-% IV SOLR
INTRAVENOUS | Status: AC
  Filled 2015-06-30: qty 50

## 2015-06-30 MED ORDER — GLYCOPYRROLATE 0.2 MG/ML IJ SOLN
INTRAMUSCULAR | Status: DC | PRN
  Administered 2015-06-30: .6 mg via INTRAVENOUS
  Administered 2015-06-30: 0.2 mg via INTRAVENOUS

## 2015-06-30 MED ORDER — OXYMETAZOLINE HCL 0.05 % NA SOLN
NASAL | Status: DC | PRN
  Administered 2015-06-30: 2 via NASAL

## 2015-06-30 MED ORDER — ONDANSETRON HCL 4 MG/2ML IJ SOLN
INTRAMUSCULAR | Status: DC | PRN
Start: 2015-06-30 — End: 2015-06-30
  Administered 2015-06-30: 4 mg via INTRAVENOUS

## 2015-06-30 MED ORDER — FENTANYL CITRATE (PF) 100 MCG/2ML IJ SOLN
INTRAMUSCULAR | Status: DC | PRN
  Administered 2015-06-30: 100 ug via INTRAVENOUS
  Administered 2015-06-30: 50 ug via INTRAVENOUS

## 2015-06-30 MED ORDER — BUPIVACAINE-EPINEPHRINE 0.5% -1:200000 IJ SOLN
INTRAMUSCULAR | Status: DC | PRN
  Administered 2015-06-30: 3.6 mL

## 2015-06-30 MED ORDER — PHENYLEPHRINE HCL 10 MG/ML IJ SOLN
10.0000 mg | INTRAVENOUS | Status: DC | PRN
  Administered 2015-06-30: 25 ug/min via INTRAVENOUS

## 2015-06-30 MED ORDER — 0.9 % SODIUM CHLORIDE (POUR BTL) OPTIME
TOPICAL | Status: DC | PRN
  Administered 2015-06-30: 1000 mL

## 2015-06-30 MED ORDER — CISATRACURIUM BESYLATE (PF) 10 MG/5ML IV SOLN
INTRAVENOUS | Status: DC | PRN
  Administered 2015-06-30: 10 mg via INTRAVENOUS

## 2015-06-30 MED ORDER — EPHEDRINE SULFATE 50 MG/ML IJ SOLN
INTRAMUSCULAR | Status: DC | PRN
  Administered 2015-06-30: 5 mg via INTRAVENOUS
  Administered 2015-06-30: 10 mg via INTRAVENOUS
  Administered 2015-06-30 (×2): 5 mg via INTRAVENOUS

## 2015-06-30 MED ORDER — LIDOCAINE-EPINEPHRINE 2 %-1:100000 IJ SOLN
INTRAMUSCULAR | Status: AC
  Filled 2015-06-30: qty 10.2

## 2015-06-30 MED ORDER — NEOSTIGMINE METHYLSULFATE 10 MG/10ML IV SOLN
INTRAVENOUS | Status: DC | PRN
  Administered 2015-06-30: 4 mg via INTRAVENOUS

## 2015-06-30 MED ORDER — CISATRACURIUM BESYLATE 20 MG/10ML IV SOLN
INTRAVENOUS | Status: AC
  Filled 2015-06-30: qty 10

## 2015-06-30 MED ORDER — ALBUTEROL SULFATE HFA 108 (90 BASE) MCG/ACT IN AERS
INHALATION_SPRAY | RESPIRATORY_TRACT | Status: DC | PRN
  Administered 2015-06-30: 2 via RESPIRATORY_TRACT

## 2015-06-30 MED ORDER — AMINOCAPROIC ACID SOLUTION 5% (50 MG/ML)
10.0000 mL | ORAL | Status: DC
  Filled 2015-06-30: qty 100

## 2015-06-30 MED ORDER — MIDAZOLAM HCL 5 MG/5ML IJ SOLN
INTRAMUSCULAR | Status: DC | PRN
  Administered 2015-06-30: 1 mg via INTRAVENOUS

## 2015-06-30 MED ORDER — OXYCODONE-ACETAMINOPHEN 5-325 MG PO TABS: ORAL_TABLET | ORAL | Status: DC

## 2015-06-30 MED ORDER — FENTANYL CITRATE (PF) 250 MCG/5ML IJ SOLN
INTRAMUSCULAR | Status: AC
  Filled 2015-06-30: qty 5

## 2015-06-30 SURGICAL SUPPLY — 34 items
ALCOHOL 70% 16 OZ (MISCELLANEOUS) ×2 IMPLANT
ATTRACTOMAT 16X20 MAGNETIC DRP (DRAPES) ×2 IMPLANT
BLADE SURG 15 STRL LF DISP TIS (BLADE) ×2 IMPLANT
BLADE SURG 15 STRL SS (BLADE) ×2
COVER SURGICAL LIGHT HANDLE (MISCELLANEOUS) ×4 IMPLANT
GAUZE PACKING FOLDED 2  STR (GAUZE/BANDAGES/DRESSINGS) ×1
GAUZE PACKING FOLDED 2 STR (GAUZE/BANDAGES/DRESSINGS) ×1 IMPLANT
GAUZE SPONGE 4X4 16PLY XRAY LF (GAUZE/BANDAGES/DRESSINGS) ×2 IMPLANT
GLOVE BIOGEL PI IND STRL 6 (GLOVE) ×1 IMPLANT
GLOVE BIOGEL PI INDICATOR 6 (GLOVE) ×1
GLOVE SURG ORTHO 8.0 STRL STRW (GLOVE) ×2 IMPLANT
GLOVE SURG SS PI 6.0 STRL IVOR (GLOVE) ×2 IMPLANT
GOWN STRL REUS W/ TWL LRG LVL3 (GOWN DISPOSABLE) ×1 IMPLANT
GOWN STRL REUS W/TWL 2XL LVL3 (GOWN DISPOSABLE) ×2 IMPLANT
GOWN STRL REUS W/TWL LRG LVL3 (GOWN DISPOSABLE) ×1
HEMOSTAT SURGICEL 2X14 (HEMOSTASIS) ×2 IMPLANT
KIT BASIN OR (CUSTOM PROCEDURE TRAY) ×2 IMPLANT
KIT ROOM TURNOVER OR (KITS) ×2 IMPLANT
MANIFOLD NEPTUNE WASTE (CANNULA) ×2 IMPLANT
NEEDLE BLUNT 16X1.5 OR ONLY (NEEDLE) ×2 IMPLANT
NEEDLE DENTAL 27 LONG (NEEDLE) ×4 IMPLANT
NS IRRIG 1000ML POUR BTL (IV SOLUTION) ×2 IMPLANT
PACK EENT II TURBAN DRAPE (CUSTOM PROCEDURE TRAY) ×2 IMPLANT
PAD ARMBOARD 7.5X6 YLW CONV (MISCELLANEOUS) ×2 IMPLANT
SPONGE GAUZE 4X4 12PLY STER LF (GAUZE/BANDAGES/DRESSINGS) ×2 IMPLANT
SPONGE SURGIFOAM ABS GEL 100 (HEMOSTASIS) IMPLANT
SPONGE SURGIFOAM ABS GEL 12-7 (HEMOSTASIS) IMPLANT
SPONGE SURGIFOAM ABS GEL SZ50 (HEMOSTASIS) IMPLANT
SUCTION FRAZIER TIP 10 FR DISP (SUCTIONS) ×2 IMPLANT
SUT CHROMIC 3 0 PS 2 (SUTURE) ×4 IMPLANT
SYR 50ML SLIP (SYRINGE) ×2 IMPLANT
TOWEL OR 17X26 10 PK STRL BLUE (TOWEL DISPOSABLE) ×2 IMPLANT
TUBE CONNECTING 12X1/4 (SUCTIONS) ×2 IMPLANT
YANKAUER SUCT BULB TIP NO VENT (SUCTIONS) ×2 IMPLANT

## 2015-06-30 NOTE — ED Provider Notes (Signed)
CSN: [id number]     Arrival date & time 06/30/15  1801 History  This chart was scribed for Comer Locket, PA-C, working with Gareth Morgan, MD by Steva Colder, ED Scribe. The patient was seen in room TR03C/TR03C at 7:01 PM.    Chief Complaint  Patient presents with  . Dental Problem      The history is provided by the patient. No language interpreter was used.    Jody Nguyen is a 59 y.o. female with a medical hx of DM and HTN who presents to the Emergency Department complaining of dental problem onset 4 PM. She reports that she had a tooth extracted today at 3pm and she went to [facility] for an MRI and up until then she was not having any bleeding, but now her surgical wounds are bleeding. Pt notes that she had teeth pulled from the upper and lower right side. Pt notes that she is not on any blood thinners at this time. Pt reports that she is going for heart valve surgery next week and that is why she had her teeth pulled. She states that she is having associated symptoms of gum swelling/bleeding. She denies sore throat, fever, chills, and any other symptoms. Pt denies allergies to any medications.    Past Medical History  Diagnosis Date  . HLD (hyperlipidemia)   . Vitamin D deficiency   . Hypertension     malignant; Renal Art [place] 11/13: no RAS  . CVA (cerebral infarction) 1997    no residual deficit  . Anemia   . CKD (chronic kidney disease) stage 4, GFR 15-29 ml/min   . Diverticulitis   . Chronic diastolic CHF (congestive heart failure)   . History of cardiovascular stress test     a. Myoview Oct 2012 showed EF 49%, no ischemia, LVE  . History of MRI 12.2015    cardiac MRI showed ejection fraction 61%, moderate left ventricular hypertrophy, large pericardial effusion and mildly reduced RV function  . Pericardial effusion     chronic; felt to be poss related to minoxidil >> DC'd  . S/P cardiac catheterization     a. R/L HC 06/18/15:  mLAD 30%; severe pulmo HTN with PA sat 43%,  CI 1.86, prominent V waves indicative of MR; resting hypoxemia O2 sat 86% on RA  . Pulmonary hypertension 06/18/2015  . Severe mitral regurgitation   . Morbid obesity (BMI 36.05)  03/29/2015  . Chronic diastolic heart failure   . Stroke 1997    no residual effect  . Heart murmur   . Shortness of breath dyspnea   . DM (diabetes mellitus)     Type II  . Constipation    Past Surgical History  Procedure Laterality Date  . Cesarean section      x 2  . Tee without cardioversion N/A 06/08/2015    Procedure: TRANSESOPHAGEAL ECHOCARDIOGRAM (TEE);  Surgeon: Lelon Perla, MD;  Location: [facility] ENDOSCOPY;  Service: Cardiovascular;  Laterality: N/A;  . Cardiac catheterization    . Cardiac catheterization N/A 06/18/2015    Procedure: Right/Left Heart Cath and Coronary Angiography;  Surgeon: Jettie Booze, MD;  Location: [place] CV LAB;  Service: Cardiovascular;  Laterality: N/A;  . Colonoscopy     Family History  Problem Relation Age of Onset  . Heart attack Brother     MI in his 85s  . Cancer Sister     breast cancer  . Breast cancer Sister   . Stroke Mother  History  Substance Use Topics  . Smoking status: Never Smoker   . Smokeless tobacco: Never Used  . Alcohol Use: No   OB History    Gravida Para Term Preterm AB TAB SAB Ectopic Multiple Living   3 3 3             Review of Systems  Constitutional: Negative for fever and chills.  HENT: Positive for dental problem.        Gum swelling/bleeding  All other systems reviewed and are negative.     Allergies  Minoxidil; Lipitor; and Ace inhibitors  Home Medications   Prior to Admission medications   Medication Sig Start Date End Date Taking? Authorizing Provider  amiodarone (PACERONE) 200 MG tablet Take 1 tablet (200 mg total) by mouth 2 (two) times daily with a meal. Patient taking differently: Take 200 mg by mouth 2 (two) times daily with a meal. Starts this medication on 07/01/15 06/23/15   Rexene Alberts, MD   aspirin 81 MG tablet Take 81 mg by mouth daily.      Historical Provider, MD  canagliflozin (INVOKANA) 100 MG TABS tablet Take 1 tablet (100 mg total) by mouth daily. 06/21/15   Liliane Shi, PA-C  Cholecalciferol (VITAMIN D-3) 5000 UNITS TABS Take 5,000 Units by mouth daily.     Historical Provider, MD  cloNIDine (CATAPRES - DOSED IN MG/24 HR) 0.2 mg/24hr patch Place 1 patch (0.2 mg total) onto the skin every 7 (seven) days. 03/22/15   Vicie Mutters, PA-C  cyclobenzaprine (FLEXERIL) 10 MG tablet Take 1 tablet (10 mg total) by mouth 2 (two) times daily as needed for muscle spasms. 11/15/14   Britt Bottom, NP  furosemide (LASIX) 40 MG tablet Take 40 mg by mouth 2 (two) times daily. 06/01/15   Historical Provider, MD  hydrALAZINE (APRESOLINE) 100 MG tablet Take 1 tablet (100 mg total) by mouth every 8 (eight) hours. 06/14/15   Unk Pinto, MD  insulin aspart protamine - aspart (NOVOLOG MIX 70/30 FLEXPEN) (70-30) 100 UNIT/ML FlexPen Inject 0.1 mLs (10 Units total) into the skin 2 (two) times daily. 06/19/15   Liliane Shi, PA-C  labetalol (NORMODYNE) 200 MG tablet Take 200 mg by mouth 2 (two) times daily.    Historical Provider, MD  losartan (COZAAR) 50 MG tablet Take 1 tablet (50 mg total) by mouth 2 (two) times daily. 06/20/15   Liliane Shi, PA-C  Magnesium 250 MG TABS Take 250 mg by mouth daily.     Historical Provider, MD  metFORMIN (GLUCOPHAGE-XR) 500 MG 24 hr tablet Take 1 tablet (500 mg total) by mouth daily. 06/21/15   Liliane Shi, PA-C  oxyCODONE-acetaminophen (PERCOCET) 5-325 MG per tablet Take one or two tablets by mouth every 6 hours as needed for pain. 06/30/15   Lenn Cal, DDS  potassium chloride SA (K-DUR,KLOR-CON) 20 MEQ tablet Take 2 tablets (40 mEq total) by mouth daily. 06/21/15   Liliane Shi, PA-C  rosuvastatin (CRESTOR) 20 MG tablet Take 20 mg by mouth daily.    Historical Provider, MD   BP 182/98 mmHg  Pulse 90  Temp(Src) 98.1 F (36.7 C) (Oral)  Resp 21   SpO2 98% Physical Exam  Constitutional: She is oriented to person, place, and time. She appears well-developed and well-nourished. No distress.  HENT:  Head: Normocephalic and atraumatic.  Mouth/Throat: Uvula is midline, oropharynx is clear and moist and mucous membranes are normal. No trismus in the jaw. No dental abscesses.  Missing  right mandibular first molar. Bleeding controlled at time of evaluation, no evidence of abscess or other infection. Patient tolerating secretions well, patent airway.  Eyes: EOM are normal.  Neck: Neck supple. No tracheal deviation present.  Cardiovascular: Normal rate and regular rhythm.  Exam reveals no gallop and no friction rub.   Murmur heard.  Systolic murmur is present with a grade of 1/6  Pulmonary/Chest: Effort normal and breath sounds normal. No respiratory distress. She has no wheezes. She has no rales.  Musculoskeletal: Normal range of motion.  Neurological: She is alert and oriented to person, place, and time.  Skin: Skin is warm and dry.  Psychiatric: She has a normal mood and affect. Her behavior is normal.  Nursing note and vitals reviewed.   ED Course  Procedures (including critical care time) DIAGNOSTIC STUDIES: Oxygen Saturation is 97% on RA, nl by my interpretation.    COORDINATION OF CARE: 7:05 PM-Discussed treatment plan with pt at bedside and pt agreed to plan.   Labs Review Labs Reviewed - No data to display  Imaging Review No results found.   EKG Interpretation None      MDM  Vitals stable  -afebrile Pt resting comfortably in ED. Patient presents with gum bleeding following dental extraction performed earlier today at 3 PM. Patient is not anticoagulated. Hemostasis achieved with gauze packing and direct pressure. No evidence of other acute or emergent pathology. Encouraged patient to follow-up with her dental surgeon, agrees to plan.  I discussed all relevant lab findings and imaging results with pt and they  verbalized understanding. Discussed f/u with PCP within 48 hrs and return precautions, pt very amenable to plan.  Final diagnoses:  Gums, bleeding    I personally performed the services described in this documentation, which was scribed in my presence. The recorded information has been reviewed and is accurate.    Comer Locket, PA-C 07/01/15 [id number]  Gareth Morgan, MD 07/01/15 [phone]

## 2015-06-30 NOTE — Op Note (Signed)
OPERATIVE REPORT  Patient:            Jody Nguyen Date of Birth:  1955/11/28 MRN:                [id number]   DATE OF PROCEDURE:  06/30/2015  PREOPERATIVE DIAGNOSES: 1. Severe mitral regurgitation 2. Pre-heart valve surgery dental protocol 3. Chronic apical periodontitis 4. Retained root segments 5. Chronic periodontitis 6. Accretions   POSTOPERATIVE DIAGNOSES: 1. Severe mitral regurgitation 2. Pre-heart valve surgery dental protocol 3. Chronic apical periodontitis 4. Retained root segments 5. Chronic periodontitis 6. Accretions   OPERATIONS: 1. Multiple extraction of tooth numbers 4 and 30 2. 2 Quadrants of alveoloplasty 3. Gross debridement of remaining dentition   SURGEON: Lenn Cal, DDS  ASSISTANT: Camie Patience, (dental assistant)  ANESTHESIA: General anesthesia via oral endotracheal tube.  MEDICATIONS: 1. Ancef 2 g IV prior to invasive dental procedures. 2. Local anesthesia with a total utilization of 2 carpules each containing 34 mg of lidocaine with 0.017 mg of epinephrine as well as 2 carpules each containing 9 mg of bupivacaine with 0.009 mg of epinephrine.  SPECIMENS: There are 2 teeth that were discarded.  DRAINS: None  CULTURES: None  COMPLICATIONS: None   ESTIMATED BLOOD LOSS: 50 mLs.  INTRAVENOUS FLUIDS: 650 mLs of Lactated ringers solution.  INDICATIONS: The patient was recently diagnosed with severe mitral regurgitation.  A dental consultation was then requested to evaluate poor dentition.  The patient was examined and treatment planned for multiple extractions with alveoloplasty and gross debridement of remaining dentition in the operating room with general anesthesia.  This treatment plan was formulated to decrease the risks and complications associated with dental infection from affecting the patient's systemic health and the anticipated heart valve surgery.  OPERATIVE FINDINGS: Patient was examined operating room number 15.   The teeth were identified for extraction. The patient was noted be affected by chronic periodontitis, accretions, multiple retained root segments, and chronic apical periodontitis.   DESCRIPTION OF PROCEDURE: Patient was brought to the main operating room number 15. Patient was then placed in the supine position on the operating table. General anesthesia was then induced per the anesthesia team. The patient was then prepped and draped in the usual manner for dental medicine procedure. A timeout was performed. The patient was identified and procedures were verified. A throat pack was placed at this time. The oral cavity was then thoroughly examined with the findings noted above. The patient was then ready for dental medicine procedure as follows:  Local anesthesia was then administered sequentially with a total utilization of 2 carpules each containing 34 mg of lidocaine with 0.017 mg of epinephrine as well as 2 carpules  each containing 9 mg bupivacaine with 0.009 mg of epinephrine.  The Maxillary and mandibular right quadrants were approached. Maxillary Anesthesia was delivered via infiltration with lidocaine with epinephrine and mandibular inferior alveolar nerve block and long buccal nerve block was provided with bupivicane with epinephrine. Further infiltration was then achieved as needed with lidocaine with epinephrine. Tooth #4 was then approached with a 150 forceps and removed without complication. The socket was curetted and compressed appropriately. Minor alveoloplasty was performed utilizing a rongeur and bone file. The surgical site was then irrigated with copious amounts sterile saline. Surgifoam was placed in the extraction socket. The surgical site was then closed from the mesial of #3 and extended to the distal of #5 utilizing 3-0 chromic gut suture in a continuous interrupted suture technique 1.  At this point time, the mandibular right quadrant was approached. A 15 blade incision was  then made in a sulcular fashion around tooth #30. A flap was then carefully reflected. Appropriate amounts of buccal and interseptal bone was then removed with a surgical handpiece and bur and copious amounts of sterile water. The retained roots were then subluxated with a series straight elevators. The mesial root and distal root were then separately removed with a 151 forceps without complications. Alveoloplasty was then performed utilizing a rongeur and bone file. The tissues were approximated and trimmed appropriately. The surgical sites was then irrigated with copious amounts of sterile saline. A piece of Surgifoam was placed in the extraction sockets appropriately. The mandibular right surgical site was then closed from the mesial #31 and extended to the distal of #29 utilizing 3-0 chromic gut suture in a continuous interrupted suture technique 1.   At this point time the remaining dentition was approached. A gross debridement procedure was then performed utilizing the Cavitron unit. A series of hand curettes were then utilized to further remove accretions. The Cavitron was then again used to further refine th removal of accretions as needed.  At this point time, the entire mouth was irrigated with copious amounts of sterile saline. The patient was examined for complications, seeing none, the dental medicine procedure was deemed to be complete. The throat pack was removed at this time. An oral airway was then placed at the request of the anesthesia team. A series of 4 x 4 gauze were placed in the mouth to aid hemostasis. The patient was then handed over to the anesthesia team for final disposition. After an appropriate amount of time, the patient was extubated and taken to the postanesthsia care unit in good condition. All counts were correct for the dental medicine procedure.  The patient may proceed with anticipated mitral valve replacement with Dr. Roxy Manns as per his discretion.   Lenn Cal,  DDS.

## 2015-06-30 NOTE — ED Notes (Signed)
PA Cartner at bedside.  

## 2015-06-30 NOTE — ED Notes (Signed)
The pt had a tooth extracted earlier today around 1500.  Bleeding from that area since and the bleeding is not slowing down.  On no blood thinners.  Ice pack given

## 2015-06-30 NOTE — Anesthesia Procedure Notes (Signed)
Procedure Name: Intubation Date/Time: 06/30/2015 8:45 AM Performed by: Rogers Blocker Pre-anesthesia Checklist: Patient identified, Emergency Drugs available, Suction available, Patient being monitored and Timeout performed Patient Re-evaluated:Patient Re-evaluated prior to inductionOxygen Delivery Method: Circle system utilized Preoxygenation: Pre-oxygenation with 100% oxygen Intubation Type: IV induction Laryngoscope Size: Mac and 2 Grade View: Grade II Tube type: Oral Tube size: 6.5 mm Number of attempts: 1 Airway Equipment and Method: Stylet Placement Confirmation: ETT inserted through vocal cords under direct vision and breath sounds checked- equal and bilateral Secured at: 22 cm Tube secured with: Tape Dental Injury: Teeth and Oropharynx as per pre-operative assessment  Comments: AOI by A Holtzman SRNA

## 2015-06-30 NOTE — Anesthesia Postprocedure Evaluation (Signed)
  Anesthesia Post-op Note  Patient: Jody Nguyen  Procedure(s) Performed: Procedure(s): MULTIPLE EXTRACTIONS OF TOOTH #'S 4  AND 30 WITH ALVEOLOPLASTY AND GROSS DEBRIDEMENT  OF REMAINING TEETH (N/A)  Patient Location: PACU  Anesthesia Type:General  Level of Consciousness: awake, alert  and oriented  Airway and Oxygen Therapy: Patient Spontanous Breathing and Patient connected to nasal cannula oxygen  Post-op Pain: mild  Post-op Assessment: Post-op Vital signs reviewed, Patient's Cardiovascular Status Stable, Respiratory Function Stable, Patent Airway and Pain level controlled              Post-op Vital Signs: stable  Last Vitals:  Filed Vitals:   06/30/15 1115  BP: 141/73  Pulse: 82  Temp: 36.4 C  Resp:     Complications: No apparent anesthesia complications

## 2015-06-30 NOTE — H&P (Signed)
06/30/2015  Patient:            KEN OTOOLE Date of Birth:  1956/05/23 MRN:                [id number]   BP 164/82 mmHg  Pulse 90  Temp(Src) 98 F (36.7 C)  Resp 20  Ht 5' (1.524 m)  Wt 184 lb (83.462 kg)  BMI 35.94 kg/m2  SpO2 98%   NOEMY PELLERIN is a 59 year old female with anticipated mitral valve replacement. Patient now presents for medically necessary dental extractions with alveoloplasty and gross debridement of remaining dentition prior to anticipated mitral valve replacement. This is part of a medically necessary preHeart valve surgery dental protocol.  Please see note of Dr. Roxy Manns dated 07/27/2016for use as H&P for dental operating room procedure.  Lenn Cal, DDS   Progress Notes    Expand All Collapse All        [address]  [place] [address]  [phone]   CARDIOTHORACIC SURGERY CONSULTATION REPORT  Referring Provider is Stanford Breed, Denice Bors, MD PCP is Alesia Richards, MD  Chief Complaint  Patient presents with  . Mitral Regurgitation    Surgical eval for possible mitral valve repair, Cardiac Cath 06/18/15, TEE 06/08/15,     HPI:  Patient is a 59 year old African-American female with history of long-standing hypertension, chronic diastolic congestive heart failure, stage IV chronic kidney disease, type 2 diabetes mellitus with complication, and hyperlipidemia who has been referred for surgical consultation to discuss treatment options for management of severe symptomatic mitral regurgitation. The patient has a long history of hypertension with hypertensive cardiomyopathy, diastolic dysfunction, and chronic diastolic congestive heart failure. She has been followed for several years by Dr. Stanford Breed. Echocardiogram performed in February 2014 revealed mild left ventricular enlargement, moderate left ventricular hypertrophy, and ejection fraction estimated 50-55%. There was moderate left  atrial enlargement, mild mitral regurgitation, and a small pericardial effusion. The patient admits to a long history of relatively mild symptoms of exertional shortness of breath that have gradually progressed over several years. She was hospitalized in December 2015 with an acute exacerbation of chronic diastolic congestive heart failure and resting shortness of breath. Echocardiogram performed January 2016 revealed normal left ventricular systolic function, grade 2 diastolic dysfunction, mildly reduced RV function, and a moderate-sized pericardial effusion. The patient's pericardial effusion was felt possibly related to minoxidil therapy, and this was stopped. Over the past 6 months the patient's symptoms of congestive heart failure have progressed with more limiting symptoms of severe exertional shortness of breath. Follow-up echocardiogram performed 05/06/2015 revealed normal left ventricular function with very small residual pericardial effusion. Shortly after that the patient was hospitalized again with an acute exacerbation of chronic diastolic congestive heart failure. Repeat transthoracic echocardiogram performed 05/28/2015 revealed findings consistent with at least moderate and possibly severe mitral regurgitation. The patient subsequently underwent transesophageal echocardiogram 06/08/2015 confirming the presence of severe mitral regurgitation with mitral valve prolapse and flail segment involving the anterior leaflet of the mitral valve. The patient underwent left and right heart catheterization on 06/18/2015 revealing the presence of mild nonobstructive coronary artery disease. The patient had severe pulmonary hypertension and prominent V waves on pulmonary capillary wedge tracing consistent with severe mitral regurgitation. The patient was again admitted to hospital for intravenous diuresis following her cath. She was subsequently referred for elective cardiothoracic surgical  consultation.  The patient is married and lives locally in [place] with her husband and 1 adult daughter. They have  a total of 3 adult children and numerous grandchildren. The patient is currently not working. In the past she has worked in an Tree surgeon. She reports no physical limitations with exception of her progressive symptoms of exertional shortness of breath. The symptoms have progressed since December of this past year. The patient now gets short of breath with very mild physical activity and occasionally at rest. She cannot lay flat in bed without feeling short of breath. She has intermittent episodes of PND. She has not experienced lower extremity edema. She has never had any chest pain or chest tightness either with activity or at rest. She reports palpitations but she has no history of irregular heart rhythm or atrial fibrillation.  Past Medical History  Diagnosis Date  . HLD (hyperlipidemia)   . Vitamin D deficiency   . DM (diabetes mellitus)   . Hypertension     malignant; Renal Art [place] 11/13: no RAS  . CVA (cerebral infarction) 1997    no residual deficit  . Anemia   . CKD (chronic kidney disease) stage 4, GFR 15-29 ml/min   . Diverticulitis   . Chronic diastolic CHF (congestive heart failure)   . History of cardiovascular stress test     a. Myoview Oct 2012 showed EF 49%, no ischemia, LVE  . History of MRI 12.2015    cardiac MRI showed ejection fraction 61%, moderate left ventricular hypertrophy, large pericardial effusion and mildly reduced RV function  . Pericardial effusion     chronic; felt to be poss related to minoxidil >> DC'd  . S/P cardiac catheterization     a. R/L HC 06/18/15: mLAD 30%; severe pulmo HTN with PA sat 43%, CI 1.86, prominent V waves indicative of MR; resting hypoxemia O2 sat 86% on RA  . Pulmonary hypertension 06/18/2015  . Severe mitral regurgitation   . Morbid  obesity (BMI 36.05)  03/29/2015  . Chronic diastolic heart failure     Past Surgical History  Procedure Laterality Date  . Cesarean section      x 2  . Tee without cardioversion N/A 06/08/2015    Procedure: TRANSESOPHAGEAL ECHOCARDIOGRAM (TEE); Surgeon: Lelon Perla, MD; Location: [facility] ENDOSCOPY; Service: Cardiovascular; Laterality: N/A;  . Cardiac catheterization    . Cardiac catheterization N/A 06/18/2015    Procedure: Right/Left Heart Cath and Coronary Angiography; Surgeon: Jettie Booze, MD; Location: [facility]; Service: Cardiovascular; Laterality: N/A;    Family History  Problem Relation Age of Onset  . Heart attack Brother     MI in his 55s  . Cancer Sister     breast cancer  . Breast cancer Sister   . Stroke Mother     History   Social History  . Marital Status: Married    Spouse Name: N/A  . Number of Children: 3  . Years of Education: N/A   Occupational History  .      Unemployed; used to work as a Radio broadcast assistant   Social History Main Topics  . Smoking status: Never Smoker   . Smokeless tobacco: Never Used  . Alcohol Use: No  . Drug Use: No  . Sexual Activity: Yes    Birth Control/ Protection: None   Other Topics Concern  . Not on file   Social History Narrative    Current Outpatient Prescriptions  Medication Sig Dispense Refill  . aspirin 81 MG tablet Take 81 mg by mouth daily.     . canagliflozin (INVOKANA) 100 MG TABS  tablet Take 1 tablet (100 mg total) by mouth daily. 90 tablet 3  . Cholecalciferol (VITAMIN D-3) 5000 UNITS TABS Take 5,000 Units by mouth daily.     . cloNIDine (CATAPRES - DOSED IN MG/24 HR) 0.2 mg/24hr patch Place 1 patch (0.2 mg total) onto the skin every 7 (seven) days. 4 patch 2  . CRESTOR 20 MG tablet TAKE 1 BY MOUTH DAILY (Patient taking  differently: Take 20 mg by mouth daily) 90 tablet 1  . cyclobenzaprine (FLEXERIL) 10 MG tablet Take 1 tablet (10 mg total) by mouth 2 (two) times daily as needed for muscle spasms. 20 tablet 0  . furosemide (LASIX) 40 MG tablet Take 40 mg by mouth 2 (two) times daily.  5  . hydrALAZINE (APRESOLINE) 100 MG tablet Take 1 tablet (100 mg total) by mouth every 8 (eight) hours. 90 tablet 0  . insulin aspart protamine - aspart (NOVOLOG MIX 70/30 FLEXPEN) (70-30) 100 UNIT/ML FlexPen Inject 0.1 mLs (10 Units total) into the skin 2 (two) times daily. 15 mL 11  . labetalol (NORMODYNE) 200 MG tablet Take 200 mg by mouth 2 (two) times daily.    Marland Kitchen losartan (COZAAR) 50 MG tablet Take 1 tablet (50 mg total) by mouth 2 (two) times daily. 180 tablet 0  . Magnesium 250 MG TABS Take 250 mg by mouth daily.     . metFORMIN (GLUCOPHAGE-XR) 500 MG 24 hr tablet Take 1 tablet (500 mg total) by mouth daily.  2  . potassium chloride SA (K-DUR,KLOR-CON) 20 MEQ tablet Take 2 tablets (40 mEq total) by mouth daily. 60 tablet 11   No current facility-administered medications for this visit.    Allergies  Allergen Reactions  . Minoxidil Other (See Comments)    Pericardial effusion  . Lipitor [Atorvastatin] Other (See Comments)    REACTION: "pain in legs" Tolerates rosuvastatin  . Ace Inhibitors Other (See Comments)    REACTION: "not sure...think it made me drowsy all the time"      Review of Systems:  General:normal appetite, decreased energy, no weight gain, no weight loss, no fever Cardiac:no chest pain with exertion, no chest pain at rest, + SOB with exertion, + occasional resting SOB, + PND, + orthopnea, + palpitations, no arrhythmia, no atrial fibrillation, no LE edema, no dizzy spells, no syncope Respiratory:+ shortness of breath, no home oxygen,  no productive cough, no dry cough, no bronchitis, no wheezing, no hemoptysis, no asthma, no pain with inspiration or cough, no sleep apnea, no CPAP at night GI:no difficulty swallowing, no reflux, no frequent heartburn, no hiatal hernia, no abdominal pain, no constipation, no diarrhea, no hematochezia, no hematemesis, no melena GU:no dysuria, no frequency, no urinary tract infection, no hematuria, no kidney stones, + stage IV chronic kidney disease Vascular:no pain suggestive of claudication, no pain in feet, no leg cramps, no varicose veins, no DVT, no non-healing foot ulcer Neuro:+ stroke, no TIA's, no seizures, no headaches, no temporary blindness one eye, no slurred speech, no peripheral neuropathy, no chronic pain, no instability of gait, no memory/cognitive dysfunction Musculoskeletal:no arthritis, no joint swelling, no myalgias, no difficulty walking, normal mobility  Skin:no rash, no itching, no skin infections, no pressure sores or ulcerations Psych:no anxiety, no depression, no nervousness, no unusual recent stress Eyes:no blurry vision, no floaters, no recent vision changes, + wears glasses for reading ENT:no hearing loss, no loose or painful teeth, no dentures, last saw dentist many years ago Hematologic:no easy bruising, no abnormal bleeding, no clotting disorder, no  frequent epistaxis Endocrine:+ diabetes, does check CBG's at home   Physical Exam:  BP 150/90 mmHg  Pulse 84  Resp 20  Ht 4' 11.5" (1.511 m)  Wt 185 lb (83.915 kg)  BMI 36.75 kg/m2  SpO2  93% General:Obese but o/w well-appearing HEENT:Unremarkable  Neck:no JVD, no bruits, no adenopathy  Chest:clear to auscultation, symmetrical breath sounds, no wheezes, no rhonchi  CV:RRR, grade IV/VI holosystolic murmur with radiation to axilla Abdomen:soft, non-tender, no masses  Extremities:warm, well-perfused, pulses palpable but diminished, no LE edema Rectal/GUDeferred Neuro:Grossly non-focal and symmetrical throughout Skin:Clean and dry, no rashes, no breakdown   Diagnostic Tests:  Transthoracic Echocardiography  Patient:  Leronda, Merchen MR #:    [id number] Study Date: 05/28/2015 Gender:   F Age:    59 Height:   149.9 cm Weight:   87.5 kg BSA:    1.96 m^2 Pt. Status: Room:    2W38C  ADMITTING  Darlin Coco ATTENDING  Darlin Coco PERFORMING  [facility], Inpatient ORDERING   Bhagat, Bhavinkumar REFERRING  [place], [place] SONOGRAPHER Mikki Santee  cc:  ------------------------------------------------------------------- LV EF: 55% -  60%  ------------------------------------------------------------------- Indications:   Pericardial effusion 423.9.  ------------------------------------------------------------------- History:  PMH:  Congestive heart failure. Stroke. Risk factors: Hypertension. Diabetes mellitus.  ------------------------------------------------------------------- Study Conclusions  - Left ventricle: The cavity size was normal. Wall thickness was increased in a pattern of mild LVH. Systolic function was normal. The estimated  ejection fraction was in the range of 55% to 60%. Wall motion was normal; there were no regional wall motion abnormalities. Features are consistent with a pseudonormal left ventricular filling pattern, with concomitant abnormal relaxation and increased filling pressure (grade 2 diastolic dysfunction). - Aortic valve: There was no stenosis. - Mitral valve: Mildly calcified annulus. There was at least moderate mitral regurgitation. MR was very eccentric, posteriorly-directed. Possible that there is some prolapse of A2 segment leading to the MR but difficult to tell mechanism. - Left atrium: The atrium was moderately dilated. - Right ventricle: The cavity size was normal. Systolic function was normal. - Atrial septum: The septum bowed from left to right, consistent with increased left atrial pressure. - Tricuspid valve: Peak RV-RA gradient (S): 44 mm Hg. - Pulmonary arteries: PA peak pressure: 47 mm Hg (S). - Inferior vena cava: The vessel was normal in size. The respirophasic diameter changes were in the normal range (>= 50%), consistent with normal central venous pressure. - Pericardium, extracardiac: A small circumferential pericardial effusion was identified, no tamponade.  Impressions:  - Normal LV size with mild LV hypertrophy. EF 55-60%. Moderate diastolic dysfunction. Very eccentric, posteriorly-directed mitral regurgitation, at least moderate. Not fully visualized. I am not definitely sure of the mechanism but may be prolapse of A2 segment of anterior MV leaflet. Normal RV size and systolic function. Mild pulmonary hypertension. Small circumferential pericardial effusion without tamponade. Would consider TEE to more closely assess the MR.  Transthoracic echocardiography. M-mode, complete 2D, spectral Doppler, and color Doppler. Birthdate: Patient birthdate: [birth year]. Age: Patient is 59 yr old. Sex: Gender: female. BMI: 39  kg/m^2. Blood pressure:   149/89 Patient status: Inpatient. Study date: Study date: 05/28/2015. Study time: 10:46 AM. Location: Bedside.  -------------------------------------------------------------------  ------------------------------------------------------------------- Left ventricle: The cavity size was normal. Wall thickness was increased in a pattern of mild LVH. Systolic function was normal. The estimated ejection fraction was in the range of 55% to 60%. Wall motion was normal; there were no regional wall motion abnormalities. Features are consistent with a pseudonormal left ventricular filling  pattern, with concomitant abnormal relaxation and increased filling pressure (grade 2 diastolic dysfunction).  ------------------------------------------------------------------- Aortic valve:  Trileaflet. Doppler:  There was no stenosis. There was no regurgitation.  ------------------------------------------------------------------- Aorta: Aortic root: The aortic root was normal in size. Ascending aorta: The ascending aorta was normal in size.  ------------------------------------------------------------------- Mitral valve:  Mildly calcified annulus. Doppler:  There was no evidence for stenosis.  There was at least moderate mitral regurgitation. MR was very eccentric, posteriorly-directed. Possible that there is some prolapse of A2 segment leading to the MR but difficult to tell mechanism.  Peak gradient (D): 7 mm Hg.  ------------------------------------------------------------------- Left atrium: The atrium was moderately dilated.  ------------------------------------------------------------------- Atrial septum: The septum bowed from left to right, consistent with increased left atrial pressure.  ------------------------------------------------------------------- Right ventricle: The cavity size was normal. Systolic function  was normal.  ------------------------------------------------------------------- Pulmonic valve:  Structurally normal valve.  Cusp separation was normal. Doppler: Transvalvular velocity was within the normal range. There was trivial regurgitation.  ------------------------------------------------------------------- Tricuspid valve:  Doppler: There was trivial regurgitation.  ------------------------------------------------------------------- Right atrium: The atrium was normal in size.  ------------------------------------------------------------------- Pericardium: A small circumferential pericardial effusion was identified, no tamponade.  ------------------------------------------------------------------- Systemic veins: Inferior vena cava: The vessel was normal in size. The respirophasic diameter changes were in the normal range (>= 50%), consistent with normal central venous pressure.  ------------------------------------------------------------------- Measurements  Left ventricle               Value    Reference LV ID, ED, PLAX chordal       (H)   52.4 mm   43 - 52 LV ID, ES, PLAX chordal           30.3 mm   23 - 38 LV fx shortening, PLAX chordal       42  %   >=29 LV PW thickness, ED             17.7 mm   --------- IVS/LV PW ratio, ED             0.79     <=1.3 LV e&', lateral               11.5 cm/s  --------- LV E/e&', lateral              11.83    --------- LV e&', medial                9.1  cm/s  --------- LV E/e&', medial               14.95    --------- LV e&', average               10.3 cm/s  --------- LV E/e&', average              13.2     ---------  Ventricular septum             Value    Reference IVS thickness, ED               13.9 mm   ---------  LVOT                    Value    Reference LVOT ID, S                 20  mm   --------- LVOT area  3.14 cm^2  ---------  Aorta                    Value    Reference Aortic root ID, ED             26  mm   ---------  Left atrium                 Value    Reference LA ID, A-P, ES               46  mm   --------- LA ID/bsa, A-P           (H)   2.35 cm/m^2 <=2.2 LA volume, ES, 1-p A4C           79  ml   --------- LA volume/bsa, ES, 1-p A4C         40.3 ml/m^2 --------- LA volume, ES, 1-p A2C           80  ml   --------- LA volume/bsa, ES, 1-p A2C         40.8 ml/m^2 ---------  Mitral valve                Value    Reference Mitral E-wave peak velocity         136  cm/s  --------- Mitral deceleration time      (H)   324  ms   150 - 230 Mitral peak gradient, D           7   mm Hg --------- Mitral maximal regurg velocity,       622  cm/s  --------- PISA Mitral regurg VTI, PISA           203  cm   ---------  Pulmonary arteries             Value    Reference PA pressure, S, DP         (H)   47  mm Hg <=30  Tricuspid valve               Value    Reference Tricuspid regurg peak velocity       330  cm/s  --------- Tricuspid peak RV-RA gradient        44  mm Hg ---------  Systemic veins               Value    Reference Estimated CVP                3   mm Hg ---------  Right ventricle               Value    Reference RV s&', lateral, S              13.4 cm/s  ---------  Legend: (L) and (H)  mark values outside specified reference range.  ------------------------------------------------------------------- Prepared and Electronically Authenticated by  Loralie Champagne, M.D. 2016-07-01T15:31:35    Transesophageal Echocardiography  Patient:  Narah, Leverette MR #:    [id number] Study Date: 06/08/2015 Gender:   F Age:    59 Height:   149.9 cm Weight:   85.9 kg BSA:    1.94 m^2 Pt. Status: Room:  ADMITTING  [name] ATTENDING  [name] ORDERING   [name] PERFORMING  [name] REFERRING  [name] SONOGRAPHER Johny Chess, RDCS, CCT  cc:  ------------------------------------------------------------------- LV EF: 55% -  60%  -------------------------------------------------------------------  Indications:   Mitral regurgitation 424.0.  ------------------------------------------------------------------- Study Conclusions  - Left ventricle: Hypertrophy was noted. Systolic function was normal. The estimated ejection fraction was in the range of 55% to 60%. Wall motion was normal; there were no regional wall motion abnormalities. - Aortic valve: No evidence of vegetation. - Mitral valve: Moderate flail motion involving the middle segment of the anterior leaflet due to rupture of one or more chords. There was severe regurgitation directed eccentrically and posteriorly. - Left atrium: The atrium was moderately to severely dilated. - Right ventricle: Systolic function was mildly reduced. - Right atrium: No evidence of thrombus in the atrial cavity or appendage. - Atrial septum: No defect or patent foramen ovale was identified. - Tricuspid valve: No evidence of vegetation. - Pulmonic valve: No evidence of vegetation. - Pericardium, extracardiac: A small pericardial effusion was identified.  Impressions:  - Normal LV function; LVH; moderate to severe LAE; mildly  reduced RV function; small pericardial effusion; flail segment of anterior MV leaflet (A2) with severe eccentric MR; mild TR.  Diagnostic transesophageal echocardiography. 2D and color Doppler. Birthdate: Patient birthdate: 16-Jul-1956. Age: Patient is 59 yr old. Sex: Gender: female.  BMI: 38.3 kg/m^2. Blood pressure: 172/91 Patient status: Outpatient. Study date: Study date: 06/08/2015. Study time: 08:13 AM. Location: Endoscopy.  -------------------------------------------------------------------  ------------------------------------------------------------------- Left ventricle: Hypertrophy was noted. Systolic function was normal. The estimated ejection fraction was in the range of 55% to 60%. Wall motion was normal; there were no regional wall motion abnormalities.  ------------------------------------------------------------------- Aortic valve:  Structurally normal valve.  Cusp separation was normal. No evidence of vegetation. Doppler: There was no regurgitation.  ------------------------------------------------------------------- Aorta: Descending aorta: The descending aorta had mild diffuse disease.  ------------------------------------------------------------------- Mitral valve:  Moderate flail motion involving the middle segment of the anterior leaflet due to rupture of one or more chords. Doppler: There was severe regurgitation directed eccentrically and posteriorly.  Mean gradient (D): 5 mm Hg.  ------------------------------------------------------------------- Left atrium: The atrium was moderately to severely dilated.  ------------------------------------------------------------------- Atrial septum: No defect or patent foramen ovale was identified.  ------------------------------------------------------------------- Right ventricle: The cavity size was normal. Systolic function was mildly  reduced.  ------------------------------------------------------------------- Pulmonic valve:  Structurally normal valve.  Cusp separation was normal. No evidence of vegetation. Doppler: There was mild regurgitation.  ------------------------------------------------------------------- Tricuspid valve:  Structurally normal valve.  Leaflet separation was normal. No evidence of vegetation. Doppler: There was mild regurgitation.  ------------------------------------------------------------------- Right atrium: The atrium was normal in size. No evidence of thrombus in the atrial cavity or appendage.  ------------------------------------------------------------------- Pericardium: A small pericardial effusion was identified.  ------------------------------------------------------------------- Measurements  Mitral valve         Value Mitral mean velocity, D   104  cm/s Mitral mean gradient, D   5   mm Hg Mitral annulus VTI, D    30.8 cm  Legend: (L) and (H) mark values outside specified reference range.  ------------------------------------------------------------------- Prepared and Electronically Authenticated by  [name] 2016-07-12T09:17:24    CARDIAC CATHETERIZATION  Conclusion     Nonobstructive coronary artery disease.  Severe pulmonary artery hypertension. PA saturation 43%. Cardiac index 1.86. Prominent V waves indicative of mitral regurgitation.  Resting hypoxemia with oxygen saturation 86% on room air.  Systemic hypertension  Discussed the case with Dr. Stanford Breed. Will admit the patient. She will need to be diuresed as her renal function allows. Hopefully, this will help with her decreased oxygen saturations and pulmonary hypertension. Further consultation with Dr. Roxy Manns to be obtained for mitral valve repair.  Coronary Findings    Dominance: Right   Left Anterior Descending   . Mid LAD  lesion, 30% stenosed.   . Second Diagonal Branch   The vessel is small in size.   . Third Diagonal Branch   The vessel is small in size.     Left Circumflex  The vessel is small .       Right Heart Pressures Hemodynamic findings consistent with severe pulmonary hypertension. Elevated LV EDP consistent with volume overload. Prominent V waves    Left Heart    Left Ventricle elevated LVEDP   Aortic Valve There is no aortic valve stenosis.    Coronary Diagrams    Diagnostic Diagram            Implants    Name ID Temporary Type Supply   No information to display    Hemo Data       Most Recent Value   Fick Cardiac Output  3.37 L/min   Fick Cardiac Output Index  1.86 (L/min)/BSA   RA A Wave  19 mmHg   RA V Wave  16 mmHg   RA Mean  14 mmHg   RV Systolic Pressure  92 mmHg   RV Diastolic Pressure  11 mmHg   RV EDP  17 mmHg   PA Systolic Pressure  99 mmHg   PA Diastolic Pressure  49 mmHg   PA Mean  67 mmHg   PW A Wave  29 mmHg   PW V Wave  23 mmHg   PW Mean  25 mmHg   AO Systolic Pressure  123XX123 mmHg   AO Diastolic Pressure  [id number] mmHg   AO Mean  0000000 mmHg   LV Systolic Pressure  0000000 mmHg   LV Diastolic Pressure  16 mmHg   LV EDP  22 mmHg   Arterial Occlusion Pressure Extended Systolic Pressure  123XX123 mmHg   Arterial Occlusion Pressure Extended Diastolic Pressure  99991111 mmHg   Arterial Occlusion Pressure Extended Mean Pressure  135 mmHg   Left Ventricular Apex Extended Systolic Pressure  123456 mmHg   Left Ventricular Apex Extended Diastolic Pressure  20 mmHg   Left Ventricular Apex Extended EDP Pressure  24 mmHg   QP/QS  1   TPVR Index  35.95 HRUI   TSVR Index  73.5 HRUI   PVR SVR Ratio  0.34   TPVR/TSVR Ratio  0.49      Impression:  Patient has stage D  severe symptomatic primary mitral regurgitation with preserved left ventricular systolic function. She presents with gradual progression of symptoms of shortness of breath, orthopnea, and PND consistent with chronic diastolic congestive heart failure, recently New York Heart Association class III-IV. She has been hospitalized a total of 3 times since this past December with acute exacerbations of congestive heart failure. I have personally reviewed the patient's recent transthoracic and transesophageal echocardiograms and diagnostic cardiac catheterization. 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 Left ventricular systolic function remains preserved, although the patient does have significant left ventricular hypertrophy and diastolic dysfunction. The patient does not have significant coronary artery disease but right heart catheterization was notable for the presence of severe pulmonary hypertension. Risks associated with surgery will be somewhat increased because of the patient's numerous comorbid medical problems including morbid obesity, long-standing hypertension with hypertensive cardiomyopathy, type 2 diabetes mellitus with complications, previous stroke in the  remote past, and chronic kidney disease.    Plan:  The patient and her husband were counseled at length regarding the indications, risks and potential benefits of mitral valve repair. The rationale for elective surgery has been explained, including a comparison between surgery and continued medical therapy with close follow-up. The likelihood of successful and durable valve repair has been discussed with particular reference to the findings of their recent echocardiogram. Based upon these findings and previous experience, I have quoted them a greater than 80 percent likelihood of successful valve repair.  In the unlikely event that their valve cannot be successfully repaired, we discussed the possibility of replacing the mitral valve using a mechanical prosthesis with the attendant need for long-term anticoagulation versus the alternative of replacing it using a bioprosthetic tissue valve with its potential for late structural valve deterioration and failure, depending upon the patient's longevity. The patient understands and accepts all potential risks of surgery including but not limited to risk of death, stroke or other neurologic complication, myocardial infarction, congestive heart failure, respiratory failure, renal failure, bleeding requiring transfusion and/or reexploration, arrhythmia, infection or other wound complications, pneumonia, pleural and/or pericardial effusion, pulmonary embolus, aortic dissection or other major vascular complication, or delayed complications related to valve repair or replacement including but not limited to structural valve deterioration and failure, thrombosis, embolization, endocarditis, or paravalvular leak. Alternative surgical approaches have been discussed including a comparison between conventional sternotomy and minimally-invasive techniques. The relative risks and benefits of each have been reviewed as they pertain to the patient's specific circumstances, and all of their questions have been addressed. Specific risks potentially related to the minimally-invasive approach were discussed at length, including but not limited to risk of conversion to full or partial sternotomy, aortic dissection or other major vascular complication, unilateral acute lung injury or pulmonary edema, phrenic nerve dysfunction or paralysis, rib fracture, chronic pain, lung hernia, or lymphocele. All of their questions have been answered. We tentatively plan to proceed with surgery on Thursday, 07/08/2015. The patient will be referred for dental service consultation to be cleared prior to  surgery. The patient will additionally undergo CT angiography to evaluate the potential feasibility of peripheral cannulation for surgery. The patient has been instructed to stop taking metformin 2 days prior to her CT angiogram. The patient has been given a prescription for amiodarone to begin 1 week prior to surgery. The patient will return for follow-up on Tuesday, 07/06/2015.   I spent in excess of 90 minutes during the conduct of this office consultation and >50% of this time involved direct face-to-face encounter with the patient for counseling and/or coordination of their care.    [name], MD 06/23/2015 12:35 PM

## 2015-06-30 NOTE — Progress Notes (Signed)
PRE-OPERATIVE NOTE:  06/30/2015   Gwenyth Ober [id number]  VITALS: BP 164/82 mmHg  Pulse 90  Temp(Src) 98 F (36.7 C)  Resp 20  Ht 5' (1.524 m)  Wt 184 lb (83.462 kg)  BMI 35.94 kg/m2  SpO2 98%  Lab Results  Component Value Date   WBC 4.2 06/29/2015   HGB 11.9* 06/30/2015   HCT 35.0* 06/30/2015   MCV 79.4 06/29/2015   PLT 329 06/29/2015   BMET    Component Value Date/Time   NA 140 06/30/2015 0648   NA 140 03/03/2013 0955   K 3.4* 06/30/2015 0648   K 3.8 03/03/2013 0955   CL 102 06/29/2015 1053   CL 99 03/03/2013 0955   CO2 28 06/29/2015 1053   CO2 29 03/03/2013 0955   GLUCOSE 125* 06/30/2015 0648   GLUCOSE 210* 03/03/2013 0955   BUN 27* 06/29/2015 1053   BUN 28.1* 03/03/2013 0955   CREATININE 1.88* 06/29/2015 1053   CREATININE 1.84* 06/25/2015 0748   CREATININE 1.9* 03/03/2013 0955   CREATININE 1.15* 03/06/2012 0923   CALCIUM 10.0 06/29/2015 1053   CALCIUM 9.9 03/03/2013 0955   GFRNONAA 29* 06/29/2015 1053   GFRNONAA 40* 06/19/2015 0351   GFRAA 33* 06/29/2015 1053   GFRAA 46* 06/19/2015 0351    Lab Results  Component Value Date   INR 1.0 06/14/2015   No results found for: PTT   Gwenyth Ober presents for multiple dental extractions with alveoloplasty and gross debridement of remaining dentition in the operating room with general anesthesia.  SUBJECTIVE: The patient denies any acute medical or dental changes and agrees to proceed with treatment as planned.  EXAM: No sign of acute dental changes.  ASSESSMENT: Patient is affected by chronic apical periodontitis, multiple retained root segments, chronic periodontitis, accretions and tooth mobility.  PLAN: Patient agrees to proceed with treatment as planned in the operating room as previously discussed and accepts the risks, benefits, and complications of the proposed treatment. Patient is aware of the risk for bleeding, bruising, swelling, infection, pain, nerve damage, soft tissue damage,  damage to adjacent teeth, sinus involvement, root tip fracture, mandible fracture, and the risks of complications associated with the anesthesia. Patient is aware of risk for complications up to and including death due to her overall cardiovascular compromise. Patient also is aware of the potential for other complications not mentioned above.   Lenn Cal, DDS

## 2015-06-30 NOTE — Transfer of Care (Signed)
Immediate Anesthesia Transfer of Care Note  Patient: Jody Nguyen  Procedure(s) Performed: Procedure(s): MULTIPLE EXTRACTIONS OF TOOTH #'S 4  AND 30 WITH ALVEOLOPLASTY AND GROSS DEBRIDEMENT  OF REMAINING TEETH (N/A)  Patient Location: PACU  Anesthesia Type:General  Level of Consciousness: awake and patient cooperative  Airway & Oxygen Therapy: Patient Spontanous Breathing and Patient connected to face mask oxygen  Post-op Assessment: Report given to RN, Post -op Vital signs reviewed and stable and Patient moving all extremities  Post vital signs: Reviewed and stable  Last Vitals:  Filed Vitals:   06/30/15 0633  BP: 164/82  Pulse: 90  Temp: 36.7 C  Resp: 20    Complications: No apparent anesthesia complications

## 2015-06-30 NOTE — Anesthesia Preprocedure Evaluation (Addendum)
Anesthesia Evaluation  Patient identified by MRN, date of birth, ID band Patient awake    Reviewed: Allergy & Precautions, NPO status , Patient's Chart, lab work & pertinent test results, reviewed documented beta blocker date and time   History of Anesthesia Complications Negative for: history of anesthetic complications  Airway Mallampati: II  TM Distance: >3 FB Neck ROM: Full    Dental  (+) Teeth Intact, Dental Advisory Given   Pulmonary shortness of breath,  breath sounds clear to auscultation        Cardiovascular hypertension, Pt. on medications +CHF + Valvular Problems/Murmurs MR Rhythm:Regular Rate:Normal + Systolic murmurs 0000000 echo:  EF 55-60%; Gr 2 DD; Eccentric mod MR   Neuro/Psych CVA, No Residual Symptoms    GI/Hepatic negative GI ROS, Neg liver ROS,   Endo/Other  diabetes, Type 2, Insulin Dependent, Oral Hypoglycemic Agents  Renal/GU Renal InsufficiencyRenal disease     Musculoskeletal negative musculoskeletal ROS (+)   Abdominal   Peds  Hematology  (+) anemia ,   Anesthesia Other Findings   Reproductive/Obstetrics                          Anesthesia Physical Anesthesia Plan  ASA: III  Anesthesia Plan: General   Post-op Pain Management:    Induction: Intravenous  Airway Management Planned: Oral ETT  Additional Equipment:   Intra-op Plan:   Post-operative Plan:   Informed Consent: I have reviewed the patients History and Physical, chart, labs and discussed the procedure including the risks, benefits and alternatives for the proposed anesthesia with the patient or authorized representative who has indicated his/her understanding and acceptance.   Dental advisory given  Plan Discussed with: CRNA and Anesthesiologist  Anesthesia Plan Comments:        Anesthesia Quick Evaluation

## 2015-06-30 NOTE — Discharge Instructions (Signed)

## 2015-06-30 NOTE — Discharge Instructions (Signed)
Dental Dry Socket A dental dry socket can happen after a tooth is pulled. When a tooth gets pulled, it leaves a hole (socket). Normally, blood fills up the hole and hardens (clots). A dry socket happens if blood gets removed from the hole or does not fill the hole.  HOME CARE  Follow your dentist's instructions.  Only take medicines as told by your dentist.  Take your medicine (antibiotics) as told if you are given medicine. Finish them even if you start to feel better.  Wait 1 day to rinse your mouth with warm salt water. Spit water out very gently.  Avoid bubbly (carbonated) drinks.  Avoid alcohol.  Avoid smoking. GET HELP RIGHT AWAY IF:  Your medicine does not help your pain.  You have puffiness (swelling), severe pain, or you cannot stop bleeding.  You have a temperature by mouth above 102 F (38.9 C), not controlled by medicine.  You have trouble swallowing or cannot open your mouth.  You have severe problems (symptoms). MAKE SURE YOU:  Understand these instructions.  Will watch your condition.  Will get help right away if you are not doing well or get worse. Document Released: 11/13/2005 Document Revised: 02/05/2012 Document Reviewed: 03/06/2011 [facility] Patient Information 2015 [place], [place]. This information is not intended to replace advice given to you by your health care provider. Make sure you discuss any questions you have with your health care provider.  Dental Extraction A dental extraction procedure refers to a routine tooth extraction performed by your dentist. The procedure depends on where and how the tooth is positioned. The procedure can be very quick, sometimes lasting only seconds. Reasons for dental extraction include:  Tooth decay.  Infections (abscesses).  The need to make room for other teeth.  Gum diseases where the supporting bone has been destroyed.  Fractures of the tooth leaving it unrestorable.  Extra teeth (supernumerary) or  grossly malformed teeth.  Baby teeththat have not fallen out in time and have not permitted the the permanent teeth to erupt properly.  In preparation for braces where there is not enough room to align the teeth properly.  Not enough room for wisdom teeth (particularly those that are impacted).  Prior to receiving radiation to the head and neck,teeth in the field of radiation may need to be extracted. LET YOUR CAREGIVER KNOW ABOUT:  Any allergies.  All medicines you are taking:  Including herbs, eye drops, over-the-counter medications, and creams.  Blood thinners (anticoagulants), aspirin, or other drugs that may affect blood clotting.  Use of steroids (through mouth or as creams).  Previous problems with anesthetics, including local anesthetics.  History of bleeding or blood problems.  Previous surgery.  Possibility of pregnancy if this applies.  Smoking history.  Any health problems. RISKS AND COMPLICATIONS As with any procedure, complications may occur, but they can usually be managed by your caregiver. General surgical complications may include:  Reaction to anesthesia.  Damage to surrounding teeth, nerves, tissues, or structures.  Infection.  Bleeding. With appropriate treatment and care after surgery, the following complications are very uncommon:  Dry socket (blood clot does not form or stay in place over empty socket). This can delay healing.  Incomplete extraction of roots.  Jawbone injury, pain, or weakness. BEFORE THE PROCEDURE  Your dental care provider will:  Take a medical and dental history.  Take an X-ray to evaluate the circumstances and how to best extract the tooth.  Do an oral exam.  Depending on the situation, antibiotics  may be given before or after the extraction.  Your caregivers may review the procedure, the local anesthesia and/or sedation being used, and what to expect after the procedure with you.  If needed, your dentist  may give you a form of sedation, either by medicine you swallow, gas, or intravenously (IV). This will help to relieve anxiety. Complicated extractions may require the use of general anesthesia. It is important to follow your caregiver's instructions prior to your procedure to avoid complications. Steps before your procedure may include:  Alert your caregiver if you feel ill (sore throat, fever, upset stomach, etc.) in the days leading up to your procedure.  Stop taking certain medications for several days prior to your procedure such as blood thinners.  Take certain medications, such as antibiotics.  Avoid eating and drinking for several hours before the procedure. This will help you to avoid complications from the sedation or anesthesia.  Sign a patient consent form.  Have a friend or family member drive you to the dentist and drive you home after the procedure.  Wear comfortable, loose clothing. Limit makeup and jewelry.  Quit smoking. If you are a smoker, this will raise the chances of a healing problem after your procedure. If you are thinking about quitting, talk to your surgeon about how long before the operation you should stop smoking. You may also get help from your primary caregiver. PROCEDURE Dental extraction is typically done as an outpatient procedure. IV sedation, local anesthesia, or both may be used. It will keep you comfortable and free of pain during the procedure.  There are 2 types of extractions:  Simple extraction involves a tooth that is visible in the mouth and above the gum line. After local anesthetic is given by injection, and the area is numbed, the dentist will loosen the tooth with a special instrument (elevator). Then another instrument (forceps) will be used to grasp the tooth and remove it from its socket. During the procedure you will feel some pressure, but you should not feel pain. If you do feel pain, tell your dentist. The open socket will be cleaned.  Dressings (gauze) will be placed in the socket to reduce bleeding.  Surgical extractions are used if the tooth has not come into the mouth or the tooth is broken off below the gum line. The dentist will make a cut (incision) in the gum and may have to remove some of the bone around the tooth to aid in the removal of the tooth. After removal, stitches (sutures) may be required to close the area to help in healing and control bleeding. For some surgical extractions, you may need a general anesthetic or IV sedation (through the vein). After both types of extractions, you may be given pain medication or other drugs to help healing. Other postoperative instructions will be given by your dental caregiver. AFTER THE PROCEDURE  You will have gauze in your mouth where the tooth was removed. Gentle pressure on the gauze for up to 1 hour will help to control bleeding.  A blood clot will begin to form over the open socket. This is normal. Do not touch the area or rinse it.  Your pain will be controlled with medication and self-care.  You will be given detailed instructions for care after surgery. PROGNOSIS While some discomfort is normal after tooth extraction, most patients recover fully in just a few days. SEEK IMMEDIATE DENTAL CARE  You have uncontrolled bleeding, marked swelling, or severe pain.  You develop  a fever, difficulty swallowing, or other severe symptoms.  You have questions or concerns. Document Released: 11/13/2005 Document Revised: 03/30/2014 Document Reviewed: 02/17/2011 [facility] Patient Information 2015 [place], [place]. This information is not intended to replace advice given to you by your health care provider. Make sure you discuss any questions you have with your health care provider.

## 2015-07-01 ENCOUNTER — Encounter (HOSPITAL_COMMUNITY): Payer: Self-pay

## 2015-07-01 ENCOUNTER — Ambulatory Visit (HOSPITAL_COMMUNITY)
Admission: RE | Admit: 2015-07-01 | Discharge: 2015-07-01 | Disposition: A | Discharge status: Home / Self Care | Payer: BLUE CROSS/BLUE SHIELD | Source: Ambulatory Visit | Attending: Thoracic Surgery (Cardiothoracic Vascular Surgery) | Admitting: Thoracic Surgery (Cardiothoracic Vascular Surgery)

## 2015-07-01 ENCOUNTER — Other Ambulatory Visit: Payer: Self-pay | Admitting: Thoracic Surgery (Cardiothoracic Vascular Surgery)

## 2015-07-01 VITALS — BP 141/76 | HR 78 | Resp 20

## 2015-07-01 DIAGNOSIS — R918 Other nonspecific abnormal finding of lung field: Secondary | ICD-10-CM | POA: Diagnosis not present

## 2015-07-01 DIAGNOSIS — I517 Cardiomegaly: Secondary | ICD-10-CM | POA: Insufficient documentation

## 2015-07-01 DIAGNOSIS — I2510 Atherosclerotic heart disease of native coronary artery without angina pectoris: Secondary | ICD-10-CM | POA: Insufficient documentation

## 2015-07-01 DIAGNOSIS — R590 Localized enlarged lymph nodes: Secondary | ICD-10-CM | POA: Insufficient documentation

## 2015-07-01 DIAGNOSIS — I313 Pericardial effusion (noninflammatory): Secondary | ICD-10-CM | POA: Insufficient documentation

## 2015-07-01 DIAGNOSIS — Z01818 Encounter for other preprocedural examination: Secondary | ICD-10-CM | POA: Insufficient documentation

## 2015-07-01 DIAGNOSIS — I7409 Other arterial embolism and thrombosis of abdominal aorta: Secondary | ICD-10-CM

## 2015-07-01 DIAGNOSIS — I70203 Unspecified atherosclerosis of native arteries of extremities, bilateral legs: Secondary | ICD-10-CM | POA: Diagnosis not present

## 2015-07-01 DIAGNOSIS — I719 Aortic aneurysm of unspecified site, without rupture: Secondary | ICD-10-CM

## 2015-07-01 DIAGNOSIS — I700 Atherosclerosis of aorta: Secondary | ICD-10-CM | POA: Diagnosis not present

## 2015-07-01 DIAGNOSIS — K5730 Diverticulosis of large intestine without perforation or abscess without bleeding: Secondary | ICD-10-CM | POA: Diagnosis not present

## 2015-07-01 DIAGNOSIS — I340 Nonrheumatic mitral (valve) insufficiency: Secondary | ICD-10-CM

## 2015-07-01 MED ORDER — SODIUM BICARBONATE BOLUS VIA INFUSION
INTRAVENOUS | Status: AC
  Administered 2015-07-01: 250 meq via INTRAVENOUS
  Filled 2015-07-01: qty 1

## 2015-07-01 MED ORDER — IOHEXOL 350 MG/ML SOLN
100.0000 mL | Freq: Once | INTRAVENOUS | Status: AC | PRN
  Administered 2015-07-01: 100 mL via INTRAVENOUS

## 2015-07-01 MED ORDER — SODIUM BICARBONATE 8.4 % IV SOLN
INTRAVENOUS | Status: AC
  Administered 2015-07-01: 14:00:00 via INTRAVENOUS
  Filled 2015-07-01: qty 500

## 2015-07-02 MED ORDER — IOHEXOL 350 MG/ML SOLN
100.0000 mL | Freq: Once | INTRAVENOUS | Status: AC | PRN
Start: 2015-07-02 — End: 2015-07-02
  Administered 2015-07-02: 100 mL via INTRAVENOUS

## 2015-07-05 ENCOUNTER — Encounter: Payer: BLUE CROSS/BLUE SHIELD | Admitting: Thoracic Surgery (Cardiothoracic Vascular Surgery)

## 2015-07-06 ENCOUNTER — Other Ambulatory Visit: Payer: Self-pay | Admitting: *Deleted

## 2015-07-06 ENCOUNTER — Encounter (HOSPITAL_COMMUNITY): Payer: Self-pay | Admitting: Dentistry

## 2015-07-06 ENCOUNTER — Ambulatory Visit (HOSPITAL_COMMUNITY)
Admission: RE | Admit: 2015-07-06 | Discharge: 2015-07-06 | Disposition: A | Discharge status: Home / Self Care | Payer: BLUE CROSS/BLUE SHIELD | Source: Ambulatory Visit | Attending: Thoracic Surgery (Cardiothoracic Vascular Surgery) | Admitting: Thoracic Surgery (Cardiothoracic Vascular Surgery)

## 2015-07-06 ENCOUNTER — Ambulatory Visit (HOSPITAL_COMMUNITY): Payer: Self-pay | Admitting: Dentistry

## 2015-07-06 ENCOUNTER — Ambulatory Visit (INDEPENDENT_AMBULATORY_CARE_PROVIDER_SITE_OTHER): Payer: BLUE CROSS/BLUE SHIELD | Admitting: Thoracic Surgery (Cardiothoracic Vascular Surgery)

## 2015-07-06 ENCOUNTER — Encounter (HOSPITAL_COMMUNITY): Payer: Self-pay

## 2015-07-06 ENCOUNTER — Other Ambulatory Visit (HOSPITAL_COMMUNITY): Payer: Self-pay

## 2015-07-06 ENCOUNTER — Encounter (HOSPITAL_COMMUNITY)
Admission: RE | Admit: 2015-07-06 | Discharge: 2015-07-06 | Disposition: A | Discharge status: Home / Self Care | Payer: BLUE CROSS/BLUE SHIELD | Source: Ambulatory Visit | Attending: Thoracic Surgery (Cardiothoracic Vascular Surgery) | Admitting: Thoracic Surgery (Cardiothoracic Vascular Surgery)

## 2015-07-06 ENCOUNTER — Encounter: Payer: Self-pay | Admitting: Thoracic Surgery (Cardiothoracic Vascular Surgery)

## 2015-07-06 VITALS — BP 131/69 | HR 80 | Temp 98.6°F

## 2015-07-06 VITALS — BP 157/81 | HR 80 | Temp 98.1°F | Resp 18 | Ht 60.0 in | Wt 181.9 lb

## 2015-07-06 VITALS — BP 139/82 | HR 88 | Resp 20 | Ht 60.0 in | Wt 180.0 lb

## 2015-07-06 DIAGNOSIS — E785 Hyperlipidemia, unspecified: Secondary | ICD-10-CM | POA: Insufficient documentation

## 2015-07-06 DIAGNOSIS — I340 Nonrheumatic mitral (valve) insufficiency: Secondary | ICD-10-CM

## 2015-07-06 DIAGNOSIS — I129 Hypertensive chronic kidney disease with stage 1 through stage 4 chronic kidney disease, or unspecified chronic kidney disease: Secondary | ICD-10-CM | POA: Diagnosis not present

## 2015-07-06 DIAGNOSIS — K0530 Chronic periodontitis, unspecified: Secondary | ICD-10-CM

## 2015-07-06 DIAGNOSIS — E1122 Type 2 diabetes mellitus with diabetic chronic kidney disease: Secondary | ICD-10-CM | POA: Insufficient documentation

## 2015-07-06 DIAGNOSIS — Z8673 Personal history of transient ischemic attack (TIA), and cerebral infarction without residual deficits: Secondary | ICD-10-CM | POA: Insufficient documentation

## 2015-07-06 DIAGNOSIS — Z79899 Other long term (current) drug therapy: Secondary | ICD-10-CM | POA: Diagnosis not present

## 2015-07-06 DIAGNOSIS — I5022 Chronic systolic (congestive) heart failure: Secondary | ICD-10-CM | POA: Diagnosis not present

## 2015-07-06 DIAGNOSIS — K08409 Partial loss of teeth, unspecified cause, unspecified class: Secondary | ICD-10-CM

## 2015-07-06 DIAGNOSIS — I272 Other secondary pulmonary hypertension: Secondary | ICD-10-CM | POA: Diagnosis not present

## 2015-07-06 DIAGNOSIS — N184 Chronic kidney disease, stage 4 (severe): Secondary | ICD-10-CM | POA: Insufficient documentation

## 2015-07-06 DIAGNOSIS — Z7982 Long term (current) use of aspirin: Secondary | ICD-10-CM | POA: Insufficient documentation

## 2015-07-06 DIAGNOSIS — Z01812 Encounter for preprocedural laboratory examination: Secondary | ICD-10-CM | POA: Diagnosis not present

## 2015-07-06 DIAGNOSIS — Z01818 Encounter for other preprocedural examination: Secondary | ICD-10-CM

## 2015-07-06 DIAGNOSIS — Z0183 Encounter for blood typing: Secondary | ICD-10-CM | POA: Insufficient documentation

## 2015-07-06 DIAGNOSIS — I517 Cardiomegaly: Secondary | ICD-10-CM | POA: Diagnosis not present

## 2015-07-06 DIAGNOSIS — Z794 Long term (current) use of insulin: Secondary | ICD-10-CM | POA: Insufficient documentation

## 2015-07-06 LAB — URINALYSIS, ROUTINE W REFLEX MICROSCOPIC
Bilirubin Urine: NEGATIVE
Glucose, UA: 250 mg/dL — AB
Hgb urine dipstick: NEGATIVE
Ketones, ur: NEGATIVE mg/dL
Leukocytes, UA: NEGATIVE
Nitrite: NEGATIVE
Protein, ur: 100 mg/dL — AB
Specific Gravity, Urine: 1.011 (ref 1.005–1.030)
Urobilinogen, UA: 0.2 mg/dL (ref 0.0–1.0)
pH: 7 (ref 5.0–8.0)

## 2015-07-06 LAB — SURGICAL PCR SCREEN
MRSA, PCR: NEGATIVE
Staphylococcus aureus: NEGATIVE

## 2015-07-06 LAB — BLOOD GAS, ARTERIAL
Acid-Base Excess: 3.2 mmol/L — ABNORMAL HIGH (ref 0.0–2.0)
Bicarbonate: 26.3 mEq/L — ABNORMAL HIGH (ref 20.0–24.0)
Drawn by: 206361
FIO2: 0.21
O2 Saturation: 95.8 %
Patient temperature: 98.6
TCO2: 27.4 mmol/L (ref 0–100)
pCO2 arterial: 34.1 mmHg — ABNORMAL LOW (ref 35.0–45.0)
pH, Arterial: 7.499 — ABNORMAL HIGH (ref 7.350–7.450)
pO2, Arterial: 68.2 mmHg — ABNORMAL LOW (ref 80.0–100.0)

## 2015-07-06 LAB — PULMONARY FUNCTION TEST
DL/VA % pred: 104 %
DL/VA: 4.43 ml/min/mmHg/L
DLCO unc % pred: 53 %
DLCO unc: 10.06 ml/min/mmHg
FEF 25-75 Post: 2.31 L/sec
FEF 25-75 Pre: 2.02 L/sec
FEF2575-%Change-Post: 14 %
FEF2575-%Pred-Post: 129 %
FEF2575-%Pred-Pre: 112 %
FEV1-%Change-Post: 7 %
FEV1-%Pred-Post: 69 %
FEV1-%Pred-Pre: 64 %
FEV1-Post: 1.21 L
FEV1-Pre: 1.12 L
FEV1FVC-%Change-Post: -1 %
FEV1FVC-%Pred-Pre: 118 %
FEV6-%Change-Post: 9 %
FEV6-%Pred-Post: 61 %
FEV6-%Pred-Pre: 55 %
FEV6-Post: 1.3 L
FEV6-Pre: 1.19 L
FEV6FVC-%Pred-Post: 104 %
FEV6FVC-%Pred-Pre: 104 %
FVC-%Change-Post: 9 %
FVC-%Pred-Post: 58 %
FVC-%Pred-Pre: 53 %
FVC-Post: 1.3 L
FVC-Pre: 1.19 L
Post FEV1/FVC ratio: 93 %
Post FEV6/FVC ratio: 100 %
Pre FEV1/FVC ratio: 94 %
Pre FEV6/FVC Ratio: 100 %
RV % pred: 67 %
RV: 1.21 L
TLC % pred: 60 %
TLC: 2.71 L

## 2015-07-06 LAB — CBC
HCT: 27.1 % — ABNORMAL LOW (ref 36.0–46.0)
Hemoglobin: 8.5 g/dL — ABNORMAL LOW (ref 12.0–15.0)
MCH: 25.8 pg — ABNORMAL LOW (ref 26.0–34.0)
MCHC: 31.4 g/dL (ref 30.0–36.0)
MCV: 82.1 fL (ref 78.0–100.0)
Platelets: 285 10*3/uL (ref 150–400)
RBC: 3.3 MIL/uL — ABNORMAL LOW (ref 3.87–5.11)
RDW: 15.9 % — ABNORMAL HIGH (ref 11.5–15.5)
WBC: 5.5 10*3/uL (ref 4.0–10.5)

## 2015-07-06 LAB — URINE MICROSCOPIC-ADD ON

## 2015-07-06 LAB — COMPREHENSIVE METABOLIC PANEL
ALT: 11 U/L — ABNORMAL LOW (ref 14–54)
AST: 19 U/L (ref 15–41)
Albumin: 4.1 g/dL (ref 3.5–5.0)
Alkaline Phosphatase: 45 U/L (ref 38–126)
Anion gap: 14 (ref 5–15)
BUN: 22 mg/dL — ABNORMAL HIGH (ref 6–20)
CO2: 23 mmol/L (ref 22–32)
Calcium: 9.6 mg/dL (ref 8.9–10.3)
Chloride: 102 mmol/L (ref 101–111)
Creatinine, Ser: 2.1 mg/dL — ABNORMAL HIGH (ref 0.44–1.00)
GFR calc Af Amer: 28 mL/min — ABNORMAL LOW (ref >60)
GFR calc non Af Amer: 24 mL/min — ABNORMAL LOW (ref >60)
Glucose, Bld: 129 mg/dL — ABNORMAL HIGH (ref 65–99)
Potassium: 3.8 mmol/L (ref 3.5–5.1)
Sodium: 139 mmol/L (ref 135–145)
Total Bilirubin: 1 mg/dL (ref 0.3–1.2)
Total Protein: 7.5 g/dL (ref 6.5–8.1)

## 2015-07-06 LAB — PROTIME-INR
INR: 1.04 (ref 0.00–1.49)
Prothrombin Time: 13.8 seconds (ref 11.6–15.2)

## 2015-07-06 LAB — ABO/RH: ABO/RH(D): O POS

## 2015-07-06 LAB — GLUCOSE, CAPILLARY: Glucose-Capillary: 112 mg/dL — ABNORMAL HIGH (ref 65–99)

## 2015-07-06 LAB — APTT: aPTT: 39 seconds — ABNORMAL HIGH (ref 24–37)

## 2015-07-06 MED ORDER — ALBUTEROL SULFATE (2.5 MG/3ML) 0.083% IN NEBU
2.5000 mg | INHALATION_SOLUTION | Freq: Once | RESPIRATORY_TRACT | Status: AC
  Administered 2015-07-06: 2.5 mg via RESPIRATORY_TRACT

## 2015-07-06 MED ORDER — CHLORHEXIDINE GLUCONATE 4 % EX LIQD: 30.0000 mL | CUTANEOUS | Status: DC

## 2015-07-06 NOTE — Progress Notes (Signed)
Pre-op Cardiac Surgery  Carotid Findings:  Bilateral:  1-39% ICA stenosis.  Vertebral artery flow is antegrade.      Upper Extremity Right Left  Brachial Pressures 164 165  Radial Waveforms Tri Tri  Ulnar Waveforms Tri Tri  Palmar Arch (Allen's Test) Decreases >50% with radial compression, normal with ulnar compression Obliterates with radial compression, normal with ulnar compression   Landry Mellow, RDMS, RVT 07/06/2015

## 2015-07-06 NOTE — Patient Instructions (Signed)
PLAN: 1. Continue salt water rinses as needed to aid healing. 2. Minimize trauma to gum tissues during brushing of teeth. 3. Avoid flossing at this time. 4. Consider Amicar oral rinses as needed for persistent oozing along with application of gauze with pressure.  5. Patient is currently cleared for heart valve surgery with Dr. Roxy Manns.   Lenn Cal, DDS

## 2015-07-06 NOTE — Progress Notes (Signed)
Confirmed that pt went for PFT and Doppler Studies today

## 2015-07-06 NOTE — Progress Notes (Signed)
   07/06/15 1424  OBSTRUCTIVE SLEEP APNEA  Have you ever been diagnosed with sleep apnea through a sleep study? No  Do you snore loudly (loud enough to be heard through closed doors)?  1  Do you often feel tired, fatigued, or sleepy during the daytime? 0  Has anyone observed you stop breathing during your sleep? 0  Do you have, or are you being treated for high blood pressure? 1  BMI more than 35 kg/m2? 1  Age over 60 years old? 1  Neck circumference greater than 40 cm/16 inches? 0  Gender: 0

## 2015-07-06 NOTE — Progress Notes (Signed)
POST OPERATIVE NOTE:  07/06/2015 Jody Nguyen [id number]  VITALS: BP 131/69 mmHg  Pulse 80  Temp(Src) 98.6 F (37 C) (Oral)  LABS:  Lab Results  Component Value Date   WBC 4.2 06/29/2015   HGB 11.9* 06/30/2015   HCT 35.0* 06/30/2015   MCV 79.4 06/29/2015   PLT 329 06/29/2015   BMET    Component Value Date/Time   NA 140 06/30/2015 0648   NA 140 03/03/2013 0955   K 3.4* 06/30/2015 0648   K 3.8 03/03/2013 0955   CL 102 06/29/2015 1053   CL 99 03/03/2013 0955   CO2 28 06/29/2015 1053   CO2 29 03/03/2013 0955   GLUCOSE 125* 06/30/2015 0648   GLUCOSE 210* 03/03/2013 0955   BUN 27* 06/29/2015 1053   BUN 28.1* 03/03/2013 0955   CREATININE 1.88* 06/29/2015 1053   CREATININE 1.84* 06/25/2015 0748   CREATININE 1.9* 03/03/2013 0955   CREATININE 1.15* 03/06/2012 0923   CALCIUM 10.0 06/29/2015 1053   CALCIUM 9.9 03/03/2013 0955   GFRNONAA 29* 06/29/2015 1053   GFRNONAA 40* 06/19/2015 0351   GFRAA 33* 06/29/2015 1053   GFRAA 46* 06/19/2015 0351    Lab Results  Component Value Date   INR 1.0 06/14/2015   No results found for: PTT   Jody Nguyen is status post multiple extractions with alveoloplasty and gross debridement of remaining dentition the operating room with general anesthesia on 06/30/2015.  SUBJECTIVE: Patient with minimal dental pain. Patient is complaining of some persistent oozing from the mandibular anterior gum tissues. Patient denies traumatizing this area.   EXAM: There is no sign of infection. Some oozing from the mandibular anterior area gum tissues. Extraction sites are healing in by secondary intention. Sutures are loosely intact.  PROCEDURE: The patient was given a chlorhexidine gluconate rinse for 30 seconds. Sutures were then removed without complication. Patient tolerated the procedure well. Pressure was applied with 4 x 4 gauze to the mandibular anterior gum tissues. Hemodent was then applied to the gum tissues and oozing  stopped. Patient was instructed about avoiding trauma to this area. May consider Amicar rinses as needed for persistent oozing.  ASSESSMENT: Post operative course is consistent with dental procedures performed in the OR. Oozing from mandibular anterior gum tissues consistent with questionable trauma. Bleeding stopped after application of 4 x 4 gauze with pressure as well as Hemodent.   PLAN: 1. Continue salt water rinses as needed to aid healing. 2. Minimize trauma to gum tissues during brushing of teeth. 3. Avoid flossing at this time. 4. Consider Amicar oral rinses as needed for persistent oozing as well as using 4 x 4 gauze to gum tissues with pressure. 5. Patient is currently cleared for heart valve surgery with Dr. Roxy Manns.   Lenn Cal, DDS

## 2015-07-06 NOTE — H&P (Signed)
[address]       ,[place] [address]             [phone]          CARDIOTHORACIC SURGERY HISTORY AND PHYSICAL EXAM  Referring Provider is Stanford Breed, Denice Bors, MD PCP is Alesia Richards, MD  Chief Complaint  Patient presents with  . Mitral Regurgitation    Surgical eval for possible mitral valve repair, Cardiac Cath 06/18/15, TEE 06/08/15,     HPI:  Patient is a 59 year old African-American female with history of long-standing hypertension, chronic diastolic congestive heart failure, stage IV chronic kidney disease, type 2 diabetes mellitus with complication, and hyperlipidemia who has been referred for surgical consultation to discuss treatment options for management of severe symptomatic mitral regurgitation. The patient has a long history of hypertension with hypertensive cardiomyopathy, diastolic dysfunction, and chronic diastolic congestive heart failure. She has been followed for several years by Dr. Stanford Breed. Echocardiogram performed in February 2014 revealed mild left ventricular enlargement, moderate left ventricular hypertrophy, and ejection fraction estimated 50-55%. There was moderate left atrial enlargement, mild mitral regurgitation, and a small pericardial effusion. The patient admits to a long history of relatively mild symptoms of exertional shortness of breath that have gradually progressed over several years. She was hospitalized in December 2015 with an acute exacerbation of chronic diastolic congestive heart failure and resting shortness of breath. Echocardiogram performed January 2016 revealed normal left ventricular systolic function, grade 2 diastolic dysfunction, mildly reduced RV function, and a moderate-sized pericardial effusion. The patient's pericardial effusion was felt possibly related to minoxidil therapy, and this was stopped. Over the past 6 months the patient's symptoms of congestive heart failure have progressed with  more limiting symptoms of severe exertional shortness of breath. Follow-up echocardiogram performed 05/06/2015 revealed normal left ventricular function with very small residual pericardial effusion. Shortly after that the patient was hospitalized again with an acute exacerbation of chronic diastolic congestive heart failure. Repeat transthoracic echocardiogram performed 05/28/2015 revealed findings consistent with at least moderate and possibly severe mitral regurgitation. The patient subsequently underwent transesophageal echocardiogram 06/08/2015 confirming the presence of severe mitral regurgitation with mitral valve prolapse and flail segment involving the anterior leaflet of the mitral valve. The patient underwent left and right heart catheterization on 06/18/2015 revealing the presence of mild nonobstructive coronary artery disease. The patient had severe pulmonary hypertension and prominent V waves on pulmonary capillary wedge tracing consistent with severe mitral regurgitation. The patient was again admitted to hospital for intravenous diuresis following her cath. She was subsequently referred for elective cardiothoracic surgical consultation.  Patient returns to the office today for follow-up of stage D severe symptomatic primary mitral regurgitation. She was originally seen in consultation on 06/23/2015. Since then she underwent dental extraction by Dr. Lawana Chambers. Since dental extraction the patient had some problems with persistent bleeding from her lower gums. She was seen in the dental clinic earlier today and this was apparently resolved. The patient has otherwise remained clinically stable. She continues to experience shortness of breath and fatigue with very low level physical activity. She cannot lay flat in bed in she intermittently has spells of paroxysmal nocturnal dyspnea. She states that symptoms have not gotten any worse, but they have not gotten any better. Remainder of her review of  systems is unchanged.  The patient is married and lives locally in [place] with her husband and 1 adult daughter. They have a total of 3 adult children and numerous  grandchildren. The patient is currently not working. In the past she has worked in an [employer]. She reports no physical limitations with exception of her progressive symptoms of exertional shortness of breath. The symptoms have progressed since December of this past year. The patient now gets short of breath with very mild physical activity and occasionally at rest. She cannot lay flat in bed without feeling short of breath. She has intermittent episodes of PND. She has not experienced lower extremity edema. She has never had any chest pain or chest tightness either with activity or at rest. She reports palpitations but she has no history of irregular heart rhythm or atrial fibrillation.          Past Medical History  Diagnosis Date  . HLD (hyperlipidemia)     takes Crestor daily  . Vitamin D deficiency   . CVA (cerebral infarction) 1997    no residual deficit  . Anemia   . CKD (chronic kidney disease) stage 4, GFR 15-29 ml/min   . Diverticulitis   . History of cardiovascular stress test     a. Myoview Oct 2012 showed EF 49%, no ischemia, LVE  . Pericardial effusion     chronic; felt to be poss related to minoxidil >> DC'd  . S/P cardiac catheterization     a. R/L HC 06/18/15:  mLAD 30%; severe pulmo HTN with PA sat 43%, CI 1.86, prominent V waves indicative of MR; resting hypoxemia O2 sat 86% on RA  . Pulmonary hypertension 06/18/2015  . Severe mitral regurgitation   . Chronic diastolic heart failure   . Stroke 1997    no residual effect  . Heart murmur   . Shortness of breath dyspnea   . Constipation   . Hypertension     takes Hydralazine,Losartan,and Labetalol daily  . DM (diabetes mellitus)     takes Novolog and Metformin daily  . Chronic diastolic CHF (congestive heart failure)     takes  Furosemide daily    Past Surgical History  Procedure Laterality Date  . Cesarean section      x 2  . Tee without cardioversion N/A 06/08/2015    Procedure: TRANSESOPHAGEAL ECHOCARDIOGRAM (TEE);  Surgeon: Lelon Perla, MD;  Location: [facility] ENDOSCOPY;  Service: Cardiovascular;  Laterality: N/A;  . Cardiac catheterization    . Cardiac catheterization N/A 06/18/2015    Procedure: Right/Left Heart Cath and Coronary Angiography;  Surgeon: Jettie Booze, MD;  Location: [facility] CV LAB;  Service: Cardiovascular;  Laterality: N/A;  . Colonoscopy    . Multiple extractions with alveoloplasty N/A 06/30/2015    Procedure: MULTIPLE EXTRACTIONS OF TOOTH #'S 4  AND 30 WITH ALVEOLOPLASTY AND GROSS DEBRIDEMENT  OF REMAINING TEETH;  Surgeon: Lenn Cal, DDS;  Location: [facility];  Service: Oral Surgery;  Laterality: N/A;    Family History  Problem Relation Age of Onset  . Heart attack Brother     MI in his 32s  . Cancer Sister     breast cancer  . Breast cancer Sister   . Stroke Mother     Social History History  Substance Use Topics  . Smoking status: Never Smoker   . Smokeless tobacco: Never Used  . Alcohol Use: No    Prior to Admission medications   Medication Sig Start Date End Date Taking? Authorizing Provider  amiodarone (PACERONE) 200 MG tablet Take 1 tablet (200 mg total) by mouth 2 (two) times daily with a meal. Patient taking differently: Take  200 mg by mouth 2 (two) times daily with a meal. Starts this medication on 07/01/15 06/23/15   Rexene Alberts, MD  aspirin 81 MG tablet Take 81 mg by mouth daily.      Historical Provider, MD  canagliflozin (INVOKANA) 100 MG TABS tablet Take 1 tablet (100 mg total) by mouth daily. 06/21/15   Liliane Shi, PA-C  Cholecalciferol (VITAMIN D-3) 5000 UNITS TABS Take 5,000 Units by mouth daily.     Historical Provider, MD  cloNIDine (CATAPRES - DOSED IN MG/24 HR) 0.2 mg/24hr patch Place 1 patch (0.2 mg total) onto the skin every 7 (seven)  days. 03/22/15   Vicie Mutters, PA-C  cyclobenzaprine (FLEXERIL) 10 MG tablet Take 1 tablet (10 mg total) by mouth 2 (two) times daily as needed for muscle spasms. 11/15/14   Britt Bottom, NP  furosemide (LASIX) 40 MG tablet Take 40 mg by mouth 2 (two) times daily. 06/01/15   Historical Provider, MD  hydrALAZINE (APRESOLINE) 100 MG tablet Take 1 tablet (100 mg total) by mouth every 8 (eight) hours. 06/14/15   Unk Pinto, MD  insulin aspart protamine - aspart (NOVOLOG MIX 70/30 FLEXPEN) (70-30) 100 UNIT/ML FlexPen Inject 0.1 mLs (10 Units total) into the skin 2 (two) times daily. 06/19/15   Liliane Shi, PA-C  labetalol (NORMODYNE) 200 MG tablet Take 200 mg by mouth 2 (two) times daily.    Historical Provider, MD  losartan (COZAAR) 50 MG tablet Take 1 tablet (50 mg total) by mouth 2 (two) times daily. 06/20/15   Liliane Shi, PA-C  Magnesium 250 MG TABS Take 250 mg by mouth daily.     Historical Provider, MD  metFORMIN (GLUCOPHAGE-XR) 500 MG 24 hr tablet Take 1 tablet (500 mg total) by mouth daily. 06/21/15   Liliane Shi, PA-C  oxyCODONE-acetaminophen (PERCOCET) 5-325 MG per tablet Take one or two tablets by mouth every 6 hours as needed for pain. 06/30/15   Lenn Cal, DDS  potassium chloride SA (K-DUR,KLOR-CON) 20 MEQ tablet Take 2 tablets (40 mEq total) by mouth daily. 06/21/15   Liliane Shi, PA-C  rosuvastatin (CRESTOR) 20 MG tablet Take 20 mg by mouth daily.    Historical Provider, MD    Allergies  Allergen Reactions  . Minoxidil Other (See Comments)    Pericardial effusion  . Lipitor [Atorvastatin] Other (See Comments)    REACTION: "pain in legs" Tolerates rosuvastatin  . Ace Inhibitors Other (See Comments)    REACTION: "not sure...think it made me drowsy all the time"       Review of Systems:  General:normal appetite, decreased energy, no weight gain, no weight loss, no fever Cardiac:no chest  pain with exertion, no chest pain at rest, + SOB with exertion, + occasional resting SOB, + PND, + orthopnea, + palpitations, no arrhythmia, no atrial fibrillation, no LE edema, no dizzy spells, no syncope Respiratory:+ shortness of breath, no home oxygen, no productive cough, no dry cough, no bronchitis, no wheezing, no hemoptysis, no asthma, no pain with inspiration or cough, no sleep apnea, no CPAP at night GI:no difficulty swallowing, no reflux, no frequent heartburn, no hiatal hernia, no abdominal pain, no constipation, no diarrhea, no hematochezia, no hematemesis, no melena GU:no dysuria, no frequency, no urinary tract infection, no hematuria, no kidney stones, + stage IV chronic kidney disease Vascular:no pain suggestive of claudication, no pain in feet, no leg cramps, no varicose veins, no DVT, no non-healing foot ulcer Neuro:+ stroke, no TIA's, no seizures,  no headaches, no temporary blindness one eye, no slurred speech, no peripheral neuropathy, no chronic pain, no instability of gait, no memory/cognitive dysfunction Musculoskeletal:no arthritis, no joint swelling, no myalgias, no difficulty walking, normal mobility  Skin:no rash, no itching, no skin infections, no pressure sores or ulcerations Psych:no anxiety, no depression, no nervousness, no unusual recent stress Eyes:no blurry vision, no floaters, no recent vision changes, + wears glasses for reading ENT:no hearing loss, no loose or painful teeth, no dentures, last saw dentist many years ago Hematologic:no easy bruising, no abnormal bleeding, no clotting  disorder, no frequent epistaxis Endocrine:+ diabetes, does check CBG's at home   Physical Exam:  BP 150/90 mmHg  Pulse 84  Resp 20  Ht 4' 11.5" (1.511 m)  Wt 185 lb (83.915 kg)  BMI 36.75 kg/m2  SpO2 93% General:Obese but o/w well-appearing HEENT:Unremarkable  Neck:no JVD, no bruits, no adenopathy  Chest:clear to auscultation, symmetrical breath sounds, no wheezes, no rhonchi  CV:RRR, grade IV/VI holosystolic murmur with radiation to axilla Abdomen:soft, non-tender, no masses  Extremities:warm, well-perfused, pulses palpable but diminished, no LE edema Rectal/GUDeferred Neuro:Grossly non-focal and symmetrical throughout Skin:Clean and dry, no rashes, no breakdown   Diagnostic Tests:  Transthoracic Echocardiography  Patient:  Jody Nguyen, Jody Nguyen MR #:    [id number] Study Date: 05/28/2015 Gender:   F Age:    59 Height:   149.9 cm Weight:   87.5 kg BSA:    1.96 m^2 Pt. Status: Room:    2W38C  ADMITTING  Darlin Coco ATTENDING  Darlin Coco PERFORMING  [facility], Inpatient ORDERING   Bhagat, Bhavinkumar REFERRING  [place], [place] SONOGRAPHER Mikki Santee  cc:  ------------------------------------------------------------------- LV EF: 55% -  60%  ------------------------------------------------------------------- Indications:   Pericardial effusion 423.9.  ------------------------------------------------------------------- History:  PMH:  Congestive heart failure. Stroke. Risk  factors: Hypertension. Diabetes mellitus.  ------------------------------------------------------------------- Study Conclusions  - Left ventricle: The cavity size was normal. Wall thickness was increased in a pattern of mild LVH. Systolic function was normal. The estimated ejection fraction was in the range of 55% to 60%. Wall motion was normal; there were no regional wall motion abnormalities. Features are consistent with a pseudonormal left ventricular filling pattern, with concomitant abnormal relaxation and increased filling pressure (grade 2 diastolic dysfunction). - Aortic valve: There was no stenosis. - Mitral valve: Mildly calcified annulus. There was at least moderate mitral regurgitation. MR was very eccentric, posteriorly-directed. Possible that there is some prolapse of A2 segment leading to the MR but difficult to tell mechanism. - Left atrium: The atrium was moderately dilated. - Right ventricle: The cavity size was normal. Systolic function was normal. - Atrial septum: The septum bowed from left to right, consistent with increased left atrial pressure. - Tricuspid valve: Peak RV-RA gradient (S): 44 mm Hg. - Pulmonary arteries: PA peak pressure: 47 mm Hg (S). - Inferior vena cava: The vessel was normal in size. The respirophasic diameter changes were in the normal range (>= 50%), consistent with normal central venous pressure. - Pericardium, extracardiac: A small circumferential pericardial effusion was identified, no tamponade.  Impressions:  - Normal LV size with mild LV hypertrophy. EF 55-60%. Moderate diastolic dysfunction. Very eccentric, posteriorly-directed mitral regurgitation, at least moderate. Not fully visualized. I am not definitely sure of the mechanism but may be prolapse of A2 segment of anterior MV leaflet. Normal RV size and systolic function. Mild pulmonary hypertension. Small circumferential pericardial  effusion without tamponade. Would consider TEE to more closely assess the MR.  Transthoracic echocardiography. M-mode, complete  2D, spectral Doppler, and color Doppler. Birthdate: Patient birthdate: [birth year]. Age: Patient is 59 yr old. Sex: Gender: female. BMI: 39 kg/m^2. Blood pressure:   149/89 Patient status: Inpatient. Study date: Study date: 05/28/2015. Study time: 10:46 AM. Location: Bedside.  -------------------------------------------------------------------  ------------------------------------------------------------------- Left ventricle: The cavity size was normal. Wall thickness was increased in a pattern of mild LVH. Systolic function was normal. The estimated ejection fraction was in the range of 55% to 60%. Wall motion was normal; there were no regional wall motion abnormalities. Features are consistent with a pseudonormal left ventricular filling pattern, with concomitant abnormal relaxation and increased filling pressure (grade 2 diastolic dysfunction).  ------------------------------------------------------------------- Aortic valve:  Trileaflet. Doppler:  There was no stenosis. There was no regurgitation.  ------------------------------------------------------------------- Aorta: Aortic root: The aortic root was normal in size. Ascending aorta: The ascending aorta was normal in size.  ------------------------------------------------------------------- Mitral valve:  Mildly calcified annulus. Doppler:  There was no evidence for stenosis.  There was at least moderate mitral regurgitation. MR was very eccentric, posteriorly-directed. Possible that there is some prolapse of A2 segment leading to the MR but difficult to tell mechanism.  Peak gradient (D): 7 mm Hg.  ------------------------------------------------------------------- Left atrium: The atrium was moderately  dilated.  ------------------------------------------------------------------- Atrial septum: The septum bowed from left to right, consistent with increased left atrial pressure.  ------------------------------------------------------------------- Right ventricle: The cavity size was normal. Systolic function was normal.  ------------------------------------------------------------------- Pulmonic valve:  Structurally normal valve.  Cusp separation was normal. Doppler: Transvalvular velocity was within the normal range. There was trivial regurgitation.  ------------------------------------------------------------------- Tricuspid valve:  Doppler: There was trivial regurgitation.  ------------------------------------------------------------------- Right atrium: The atrium was normal in size.  ------------------------------------------------------------------- Pericardium: A small circumferential pericardial effusion was identified, no tamponade.  ------------------------------------------------------------------- Systemic veins: Inferior vena cava: The vessel was normal in size. The respirophasic diameter changes were in the normal range (>= 50%), consistent with normal central venous pressure.  ------------------------------------------------------------------- Measurements  Left ventricle               Value    Reference LV ID, ED, PLAX chordal       (H)   52.4 mm   43 - 52 LV ID, ES, PLAX chordal           30.3 mm   23 - 38 LV fx shortening, PLAX chordal       42  %   >=29 LV PW thickness, ED             17.7 mm   --------- IVS/LV PW ratio, ED             0.79     <=1.3 LV e&', lateral               11.5 cm/s  --------- LV E/e&', lateral              11.83    --------- LV e&', medial                9.1  cm/s   --------- LV E/e&', medial               14.95    --------- LV e&', average               10.3 cm/s  --------- LV E/e&', average              13.2     ---------  Ventricular septum  Value    Reference IVS thickness, ED              13.9 mm   ---------  LVOT                    Value    Reference LVOT ID, S                 20  mm   --------- LVOT area                  3.14 cm^2  ---------  Aorta                    Value    Reference Aortic root ID, ED             26  mm   ---------  Left atrium                 Value    Reference LA ID, A-P, ES               46  mm   --------- LA ID/bsa, A-P           (H)   2.35 cm/m^2 <=2.2 LA volume, ES, 1-p A4C           79  ml   --------- LA volume/bsa, ES, 1-p A4C         40.3 ml/m^2 --------- LA volume, ES, 1-p A2C           80  ml   --------- LA volume/bsa, ES, 1-p A2C         40.8 ml/m^2 ---------  Mitral valve                Value    Reference Mitral E-wave peak velocity         136  cm/s  --------- Mitral deceleration time      (H)   324  ms   150 - 230 Mitral peak gradient, D           7   mm Hg --------- Mitral maximal regurg velocity,       622  cm/s  --------- PISA Mitral regurg VTI, PISA           203  cm   ---------  Pulmonary arteries             Value    Reference PA pressure, S, DP         (H)   47  mm Hg <=30  Tricuspid valve               Value    Reference Tricuspid regurg peak velocity       330  cm/s  --------- Tricuspid peak RV-RA gradient        44  mm  Hg ---------  Systemic veins               Value    Reference Estimated CVP                3   mm Hg ---------  Right ventricle               Value    Reference RV s&', lateral, S              13.4 cm/s  ---------  Legend: (L) and (H) mark values outside specified reference range.  ------------------------------------------------------------------- Prepared  and Electronically Authenticated by  Loralie Champagne, M.D. 2016-07-01T15:31:35    Transesophageal Echocardiography  Patient:  Jody Nguyen, Jody Nguyen MR #:    [id number] Study Date: 06/08/2015 Gender:   F Age:    59 Height:   149.9 cm Weight:   85.9 kg BSA:    1.94 m^2 Pt. Status: Room:  [place] [facility] ORDERING   [name] PERFORMING  [place] SONOGRAPHER Johny Chess, RDCS, CCT  cc:  ------------------------------------------------------------------- LV EF: 55% -  60%  ------------------------------------------------------------------- Indications:   Mitral regurgitation 424.0.  ------------------------------------------------------------------- Study Conclusions  - Left ventricle: Hypertrophy was noted. Systolic function was normal. The estimated ejection fraction was in the range of 55% to 60%. Wall motion was normal; there were no regional wall motion abnormalities. - Aortic valve: No evidence of vegetation. - Mitral valve: Moderate flail motion involving the middle segment of the anterior leaflet due to rupture of one or more chords. There was severe regurgitation directed eccentrically and posteriorly. - Left atrium: The atrium was moderately to severely dilated. - Right ventricle: Systolic function was mildly reduced. - Right atrium: No evidence of thrombus in the atrial cavity or appendage. -  Atrial septum: No defect or patent foramen ovale was identified. - Tricuspid valve: No evidence of vegetation. - Pulmonic valve: No evidence of vegetation. - Pericardium, extracardiac: A small pericardial effusion was identified.  Impressions:  - Normal LV function; LVH; moderate to severe LAE; mildly reduced RV function; small pericardial effusion; flail segment of anterior MV leaflet (A2) with severe eccentric MR; mild TR.  Diagnostic transesophageal echocardiography. 2D and color Doppler. Birthdate: Patient birthdate: [birth year]. Age: Patient is 59 yr old. Sex: Gender: female.  BMI: 38.3 kg/m^2. Blood pressure: 172/91 Patient status: Outpatient. Study date: Study date: 06/08/2015. Study time: 08:13 AM. Location: Endoscopy.  -------------------------------------------------------------------  ------------------------------------------------------------------- Left ventricle: Hypertrophy was noted. Systolic function was normal. The estimated ejection fraction was in the range of 55% to 60%. Wall motion was normal; there were no regional wall motion abnormalities.  ------------------------------------------------------------------- Aortic valve:  Structurally normal valve.  Cusp separation was normal. No evidence of vegetation. Doppler: There was no regurgitation.  ------------------------------------------------------------------- Aorta: Descending aorta: The descending aorta had mild diffuse disease.  ------------------------------------------------------------------- Mitral valve:  Moderate flail motion involving the middle segment of the anterior leaflet due to rupture of one or more chords. Doppler: There was severe regurgitation directed eccentrically and posteriorly.  Mean gradient (D): 5 mm Hg.  ------------------------------------------------------------------- Left atrium: The atrium was moderately to severely  dilated.  ------------------------------------------------------------------- Atrial septum: No defect or patent foramen ovale was identified.  ------------------------------------------------------------------- Right ventricle: The cavity size was normal. Systolic function was mildly reduced.  ------------------------------------------------------------------- Pulmonic valve:  Structurally normal valve.  Cusp separation was normal. No evidence of vegetation. Doppler: There was mild regurgitation.  ------------------------------------------------------------------- Tricuspid valve:  Structurally normal valve.  Leaflet separation was normal. No evidence of vegetation. Doppler: There was mild regurgitation.  ------------------------------------------------------------------- Right atrium: The atrium was normal in size. No evidence of thrombus in the atrial cavity or appendage.  ------------------------------------------------------------------- Pericardium: A small pericardial effusion was identified.  ------------------------------------------------------------------- Measurements  Mitral valve         Value Mitral mean velocity, D   104  cm/s Mitral mean gradient, D   5   mm Hg Mitral annulus VTI, D    30.8 cm  Legend: (L) and (H) mark values outside specified reference range.  ------------------------------------------------------------------- Prepared and Electronically Authenticated by  [name] 2016-07-12T09:17:24    CARDIAC CATHETERIZATION  Conclusion     Nonobstructive coronary artery disease.  Severe pulmonary artery hypertension. PA saturation 43%. Cardiac index 1.86. Prominent V waves indicative of mitral regurgitation.  Resting hypoxemia with oxygen saturation 86% on room air.  Systemic hypertension  Discussed the case with Dr. Stanford Breed. Will admit the patient. She will need to be diuresed as  her renal function allows. Hopefully, this will help with her decreased oxygen saturations and pulmonary hypertension. Further consultation with Dr. Roxy Manns to be obtained for mitral valve repair.     Coronary Findings    Dominance: Right   Left Anterior Descending   . Mid LAD lesion, 30% stenosed.   . Second Diagonal Branch   The vessel is small in size.   . Third Diagonal Branch   The vessel is small in size.     Left Circumflex  The vessel is small .       Right Heart Pressures Hemodynamic findings consistent with severe pulmonary hypertension. Elevated LV EDP consistent with volume overload. Prominent V waves    Left Heart    Left Ventricle elevated LVEDP   Aortic Valve There is no aortic valve stenosis.    Coronary Diagrams    Diagnostic Diagram            Implants    Name ID Temporary Type Supply   No information to display    Hemo Data       Most Recent Value   Fick Cardiac Output  3.37 L/min   Fick Cardiac Output Index  1.86 (L/min)/BSA   RA A Wave  19 mmHg   RA V Wave  16 mmHg   RA Mean  14 mmHg   RV Systolic Pressure  92 mmHg   RV Diastolic Pressure  11 mmHg   RV EDP  17 mmHg   PA Systolic Pressure  99 mmHg   PA Diastolic Pressure  49 mmHg   PA Mean  67 mmHg   PW A Wave  29 mmHg   PW V Wave  23 mmHg   PW Mean  25 mmHg   AO Systolic Pressure  123XX123 mmHg   AO Diastolic Pressure  XX123456 mmHg   AO Mean  0000000 mmHg   LV Systolic Pressure  0000000 mmHg   LV Diastolic Pressure  16 mmHg   LV EDP  22 mmHg   Arterial Occlusion Pressure Extended Systolic Pressure  123XX123 mmHg   Arterial Occlusion Pressure Extended Diastolic Pressure  99991111 mmHg   Arterial Occlusion Pressure Extended Mean Pressure  135 mmHg   Left Ventricular Apex Extended Systolic Pressure  123456 mmHg   Left  Ventricular Apex Extended Diastolic Pressure  20 mmHg   Left Ventricular Apex Extended EDP Pressure  24 mmHg   QP/QS  1   TPVR Index  35.95 HRUI   TSVR Index  73.5 HRUI   PVR SVR Ratio  0.34   TPVR/TSVR Ratio  0.49             CT ANGIOGRAPHY CHEST, ABDOMEN AND PELVIS  TECHNIQUE: Multidetector CT imaging through the chest, abdomen and pelvis was performed using the standard protocol during bolus administration of intravenous contrast. Multiplanar reconstructed images and MIPs were obtained and reviewed to evaluate the vascular anatomy.  CONTRAST: 100 cc Omnipaque 350  COMPARISON: Chest radiograph - 05/31/2015; cardiac MRI -11/25/2019  FINDINGS: CTA CHEST FINDINGS  Vascular Findings of the chest:  Normal caliber the thoracic aorta was  measurements as follows. No evidence of thoracic aortic dissection or periaortic stranding on this nongated examination. Review of the precontrast images are negative for the presence of an intramural hematoma.  Bovine configuration of the aortic arch is incidentally noted. The branch vessels of the aortic arch are widely patent throughout their imaged course.  Marked cardiomegaly, in particular, there is enlargement of the left ventricle. Coronary artery calcifications. Small pericardial effusion with fluid tracking to the pericardial recess, decreased since the 10/2014 examination.  Although this examination was not tailored for the evaluation of the pulmonary arteries, there are no discrete filling defects within the central pulmonary arterial tree to suggest central pulmonary embolism. Enlarged caliber the main pulmonary artery measuring 39 mm in diameter (image 58, series 501).  -------------------------------------------------------------  Thoracic aortic measurements:  Sinotubular junction  31 mm as measured in greatest oblique coronal dimension.  Proximal ascending  aorta  35 mm as measured in greatest oblique axial dimension at the level of the main pulmonary artery.  Aortic arch aorta  30 mm as measured in greatest oblique sagittal dimension.  Proximal descending thoracic aorta  27 mm as measured in greatest oblique axial dimension at the level of the main pulmonary artery.  Distal descending thoracic aorta  25 mm as measured in greatest oblique axial dimension at the level of the diaphragmatic hiatus.  Review of the MIP images confirms the above findings.  -------------------------------------------------------------  Non-Vascular Findings of the chest:  Evaluation the pulmonary parenchyma is minimally degraded secondary to patient respiratory artifact. Minimal dependent subpleural ground-glass atelectasis with geographic subsegmental atelectasis within the left lower lobe. No discrete pulmonary nodules given limitation of the examination.  Borderline enlarged right suprahilar lymph node measures approximately 1.2 cm in greatest short axis diameter (image 54, series 501). Additional scattered shotty mediastinal lymph nodes individually not enlarged by size criteria with index right-sided precarinal lymph node measuring 0.8 cm in greatest short axis diameter. No axillary lymphadenopathy.  Regional soft tissues appear normal. Normal appearance of the thyroid gland. No acute or aggressive osseous abnormalities within the chest. Stigmata of DISH within the caudal aspect of the thoracic spine. Degenerative change of the left glenohumeral joint.  -------------------------------------------------------------  CTA ABDOMEN AND PELVIS FINDINGS  Vascular Findings of the abdomen and pelvis:  Abdominal aorta: Scattered minimal amount of mixed calcified and noncalcified atherosclerotic plaque within a normal caliber abdominal aorta, not resulting in a hemodynamically significant stenosis. No abdominal aortic dissection  or periaortic stranding.  Celiac artery: There is a minimal amount of eccentric calcified plaque involving the cranial aspect of the origin of the celiac artery, not resulting in hemodynamically significant stenosis. Conventional branching pattern.  SMA: Widely patent without hemodynamically significant narrowing. The distal tributaries the SMA are widely patent without discrete intraluminal filling defect to suggest distal embolism.  Right Renal artery: Solitary; there is a minimal amount of eccentric calcified plaque involving the cranial aspect of the origin of the right renal artery, not resulting in hemodynamically significant stenosis. No vessel irregularity to suggest FMD.  Left Renal artery: Solitary; widely patent without hemodynamically significant narrowing. No vessel irregularity to suggest FMD.  IMA: Widely patent.  Pelvic vasculature: There is a minimal amount of eccentric mixed calcified and noncalcified atherosclerotic plaque within the bilateral common and external iliac arteries, not resulting in a hemodynamically significant stenosis. The bilateral common iliac arteries are noted to be mildly tortuous. The bilateral internal iliac arteries are mildly diseased though patent and of normal caliber.  Review of the MIP  images confirms the above findings.   --------------------------------------------------------------------------------  Nonvascular Findings of the abdomen and pelvis:  Evaluation of the abdominal organs is limited to the arterial phase of enhancement.  Normal hepatic contour. No discrete hyper enhancing hepatic lesions. Normal appearance of the gallbladder. No radiopaque gallstones. No intra extrahepatic biliary duct dilatation. No ascites.  There is symmetric enhancement of the bilateral kidneys. Note is made of a partially exophytic approximately 4.4 cm hypo attenuating (approximately 10 Hounsfield unit) exophytic cyst arising  from the posterior inferior aspect of the right kidney (image 168, series 501). No discrete left-sided renal lesions. No definite renal stones on this postcontrast examination. No urinary obstruction or perinephric stranding. There is mild thickening of the medial limb of the left adrenal gland without discrete nodule. Normal appearance of the right adrenal gland, pancreas and spleen.  Scattered colonic diverticulosis without evidence of diverticulitis. The bowel is otherwise normal in course and caliber without wall thickening or evidence of obstruction. Normal appearance of the appendix. No pneumoperitoneum, pneumatosis or portal venous gas.  No bulky retroperitoneal, mesenteric, pelvic or inguinal lymphadenopathy.  Note is made of an approximately 0.9 x 0.6 cm crescentic calcification within the left adnexa without discrete associated lesion. Otherwise, normal appearance of the pelvic organs for age. Normal appearance of the urinary bladder given degree distention. No free fluid in the pelvic cul-de-sac.  No acute or aggressive osseous abnormalities.  Small mesenteric fat containing midline ventral wall presumed incisional hernia (image 203, series 501), measuring approximately 2.2 x 1.6 cm in diameter.  Review of the MIP images confirms the above findings.  IMPRESSION: Vascular Impression of the chest:  1. No evidence of thoracic aortic aneurysm or dissection. 2. Cardiomegaly. Coronary artery calcifications. 3. Enlarged caliber of the main pulmonary artery, nonspecific though could be seen in the setting of pulmonary arterial hypertension. 4. Small pericardial effusion, decreased in size since the 10/2014 cardiac MRI. Nonvascular Impression of the chest:  1. Bibasilar atelectasis without acute cardiopulmonary disease. 2. Solitary borderline enlarged right suprahilar lymph node is nonspecific though presumably reactive etiology. No discrete pulmonary nodules  given background of atelectasis. ----------------------------------------------------  Vascular Impression of the abdomen and pelvis:  1. Scattered minimal amount of atherosclerotic plaque within a normal caliber abdominal aorta. Nonvascular Impression of the abdomen and pelvis:  1. Scattered minimal colonic diverticulosis without evidence of diverticulitis.   Electronically Signed  By: Sandi Mariscal M.D.  On: 07/01/2015 17:34     Impression:  Patient has stage D severe symptomatic primary mitral regurgitation with preserved left ventricular systolic function. She presents with gradual progression of symptoms of shortness of breath, orthopnea, and PND consistent with chronic diastolic congestive heart failure, recently New [place] Heart Association class III-IV. She has been hospitalized a total of 3 times since this past December with acute exacerbations of congestive heart failure. I have personally reviewed the patient's recent transthoracic and transesophageal echocardiograms and diagnostic cardiac catheterization. 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 Left ventricular systolic function remains preserved, although the patient does have significant left ventricular hypertrophy and diastolic dysfunction. The patient does not have significant coronary artery disease but right heart catheterization was notable for the presence of severe pulmonary hypertension. Risks associated with surgery will be high because of the patient's numerous comorbid medical problems including morbid obesity, pulmonary hypertension, long-standing hypertension with hypertensive cardiomyopathy, type 2 diabetes mellitus with complications,  previous stroke in the remote past, and chronic kidney disease. Routine preoperative blood work obtained earlier today demonstrates mild  acute exacerbation of chronic anemia with hemoglobin that has drifted down to 8.5. Presumably this is related to acute blood loss from the patient's recent dental extraction. The patient's serum creatinine level was also elevated slightly above her baseline at 2.1.    Plan:  The patient and her husband were again counseled at length regarding the indications, risks and potential benefits of mitral valve repair. The rationale for elective surgery has been explained, including a comparison between surgery and continued medical therapy with close follow-up. The likelihood of successful and durable valve repair has been discussed with particular reference to the findings of their recent echocardiogram. Based upon these findings and previous experience, I have quoted them a greater than 80 percent likelihood of successful valve repair. In the unlikely event that their valve cannot be successfully repaired, we discussed the possibility of replacing the mitral valve using a mechanical prosthesis with the attendant need for long-term anticoagulation versus the alternative of replacing it using a bioprosthetic tissue valve with its potential for late structural valve deterioration and failure, depending upon the patient's longevity. The patient specifically requests that if her mitral valve cannot be satisfactorily repaired she would desire for it to be replaced using a mechanical prosthesis. The patient understands and accepts all potential risks of surgery including but not limited to risk of death, stroke or other neurologic complication, myocardial infarction, congestive heart failure, respiratory failure, renal failure, bleeding requiring transfusion and/or reexploration, arrhythmia, infection or other wound complications, pneumonia, pleural and/or pericardial effusion, pulmonary embolus, aortic dissection or other major vascular complication, or delayed complications related to valve repair or replacement  including but not limited to structural valve deterioration and failure, thrombosis, embolization, endocarditis, or paravalvular leak. Alternative surgical approaches have been discussed including a comparison between conventional sternotomy and minimally-invasive techniques. The relative risks and benefits of each have been reviewed as they pertain to the patient's specific circumstances, and all of their questions have been addressed. Specific risks potentially related to the minimally-invasive approach were discussed at length, including but not limited to risk of conversion to full or partial sternotomy, aortic dissection or other major vascular complication, unilateral acute lung injury or pulmonary edema, phrenic nerve dysfunction or paralysis, rib fracture, chronic pain, lung hernia, or lymphocele. We tentatively plan to proceed with surgery on Thursday, 07/08/2015. We will obtain repeat complete blood count and basic metabolic panel on the morning of surgery to make certain that her anemia and renal function remained stable. The patient has been instructed to stop taking metformin in anticipation of surgery. All of their questions have been answered.   I spent in excess of 30 minutes during the conduct of this office consultation and >50% of this time involved direct face-to-face encounter with the patient for counseling and/or coordination of their care.    [name], MD 07/06/2015 4:16 PM

## 2015-07-06 NOTE — Progress Notes (Signed)
Medical Md is Dr.William Melford Aase  Multiple echo reports in epic with most recent in 2016  Heart cath report in epic from 2016  Cardiologist is Dr.Tilley-last visit in epic from 06-19-15  EKG in epic from 06-25-15

## 2015-07-06 NOTE — Pre-Procedure Instructions (Signed)
Jody Nguyen  07/06/2015      [id number] - [place], [place] - [address] AT [facility] OF [address] [place] [address] Phone: [phone] Fax: [phone]  [facility] [place], [place] [address] [place] [address] Phone: [phone] Fax: [phone]    Your procedure is scheduled on Thurs, Aug 11 @ 7:30 AM  Report to [facility] Admitting at 5:30 AM.  Call this number if you have problems the morning of surgery:  [phone]   Remember:  Do not eat food or drink liquids after midnight.  Take these medicines the morning of surgery with A SIP OF WATER Labetalol(Normodyne),Pacerone(Amiodarone),and Hydralazine(Apresoline)              No Goody's,BC's,Aleve,Aspirin,Ibuprofen,Fish Oil,or any Herbal Medications.    Do not wear jewelry, make-up or nail polish.  Do not wear lotions, powders, or perfumes.    Do not shave 48 hours prior to surgery.    Do not bring valuables to the hospital.  [facility] is not responsible for any belongings or valuables.  Contacts, dentures or bridgework may not be worn into surgery.  Leave your suitcase in the car.  After surgery it may be brought to your room.  For patients admitted to the hospital, discharge time will be determined by your treatment team.  Patients discharged the day of surgery will not be allowed to drive home.    Special instructions:  [facility] - Preparing for Surgery  Before surgery, you can play an important role.  Because skin is not sterile, your skin needs to be as free of germs as possible.  You can reduce the number of germs on you skin by washing with CHG (chlorahexidine gluconate) soap before surgery.  CHG is an antiseptic cleaner which kills germs and bonds with the skin to continue killing germs even after washing.  Please DO NOT use if you have an allergy to CHG or antibacterial soaps.   If your skin becomes reddened/irritated stop using the CHG and inform your nurse when you arrive at Short Stay.  Do not shave (including legs and underarms) for at least 48 hours prior to the first CHG shower.  You may shave your face.  Please follow these instructions carefully:   1.  Shower with CHG Soap the night before surgery and the                                morning of Surgery.  2.  If you choose to wash your hair, wash your hair first as usual with your       normal shampoo.  3.  After you shampoo, rinse your hair and body thoroughly to remove the                      Shampoo.  4.  Use CHG as you would any other liquid soap.  You can apply chg directly       to the skin and wash gently with scrungie or a clean washcloth.  5.  Apply the CHG Soap to your body ONLY FROM THE NECK DOWN.        Do not use on open wounds or open sores.  Avoid contact with your eyes,       ears, mouth  and genitals (private parts).  Wash genitals (private parts)       with your normal soap.  6.  Wash thoroughly, paying special attention to the area where your surgery        will be performed.  7.  Thoroughly rinse your body with warm water from the neck down.  8.  DO NOT shower/wash with your normal soap after using and rinsing off       the CHG Soap.  9.  Pat yourself dry with a clean towel.            10.  Wear clean pajamas.            11.  Place clean sheets on your bed the night of your first shower and do not        sleep with pets.  Day of Surgery  Do not apply any lotions/deoderants the morning of surgery.  Please wear clean clothes to the hospital/surgery center.  How to Manage Your Diabetes Before Surgery   Why is it important to control my blood sugar before and after surgery?   Improving blood sugar levels before and after surgery helps healing and can limit problems.  A way of improving blood sugar control is eating a healthy diet by:  - Eating less sugar and carbohydrates  -  Increasing activity/exercise  - Talk with your doctor about reaching your blood sugar goals  High blood sugars (greater than 180 mg/dL) can raise your risk of infections and slow down your recovery so you will need to focus on controlling your diabetes during the weeks before surgery.  Make sure that the doctor who takes care of your diabetes knows about your planned surgery including the date and location.  How do I manage my blood sugars before surgery?   Check your blood sugar at least 4 times a day, 2 days before surgery to make sure that they are not too high or low.   Check your blood sugar the morning of your surgery when you wake up and every 2               hours until you get to the Short-Stay unit.  If your blood sugar is less than 70 mg/dL, you will need to treat for low blood sugar by:  Treat a low blood sugar (less than 70 mg/dL) with 1/2 cup of clear juice (cranberry or apple), 4 glucose tablets, OR glucose gel.  Recheck blood sugar in 15 minutes after treatment (to make sure it is greater than 70 mg/dL).  If blood sugar is not greater than 70 mg/dL on re-check, call [phone] for further instructions.   Report your blood sugar to the Short-Stay nurse when you get to Short-Stay.  References:  [facility], 2007 "How to Manage your Diabetes Before and After Surgery".  What do I do about my diabetes medications?   Do not take oral diabetes medicines (pills) the morning of surgery.  THE NIGHT BEFORE SURGERY, take 7 units of Novolog 70/30 Insulin.    THE MORNING OF SURGERY, take NONE    Do not take other diabetes injectables the day of surgery including Byetta, Victoza, Bydureon, and Trulicity.    If your CBG is greater than 220 mg/dL, you may take 1/2 of your sliding scale (correction) dose of insulin.      Please read over the following fact sheets that you were given. Pain Booklet, Coughing and Deep Breathing, Blood  Transfusion Information, MRSA Information and Surgical Site Infection Prevention

## 2015-07-06 NOTE — Progress Notes (Signed)
Sleep study done > 5 yrs ago-no CPAP

## 2015-07-06 NOTE — Progress Notes (Signed)
[address]       [place] [address]             [phone]     CARDIOTHORACIC SURGERY OFFICE NOTE  Referring Provider is Stanford Breed, Denice Bors, MD  PCP is Alesia Richards, MD   HPI:  Patient returns to the office today for follow-up of stage D severe symptomatic primary mitral regurgitation. She was originally seen in consultation on 06/23/2015. Since then she underwent dental extraction by Dr. Lawana Chambers.  Since dental extraction the patient had some problems with persistent bleeding from her lower gums. She was seen in the dental clinic earlier today and this was apparently resolved. The patient has otherwise remained clinically stable. She continues to experience shortness of breath and fatigue with very low level physical activity. She cannot lay flat in bed in she intermittently has spells of paroxysmal nocturnal dyspnea.  She states that symptoms have not gotten any worse, but they have not gotten any better. Remainder of her review of systems is unchanged.   Current Outpatient Prescriptions  Medication Sig Dispense Refill  . amiodarone (PACERONE) 200 MG tablet Take 1 tablet (200 mg total) by mouth 2 (two) times daily with a meal. (Patient taking differently: Take 200 mg by mouth 2 (two) times daily with a meal. Starts this medication on 07/01/15) 60 tablet 0  . aspirin 81 MG tablet Take 81 mg by mouth daily.      . canagliflozin (INVOKANA) 100 MG TABS tablet Take 1 tablet (100 mg total) by mouth daily. 90 tablet 3  . Cholecalciferol (VITAMIN D-3) 5000 UNITS TABS Take 5,000 Units by mouth daily.     . cloNIDine (CATAPRES - DOSED IN MG/24 HR) 0.2 mg/24hr patch Place 1 patch (0.2 mg total) onto the skin every 7 (seven) days. 4 patch 2  . cyclobenzaprine (FLEXERIL) 10 MG tablet Take 1 tablet (10 mg total) by mouth 2 (two) times daily as needed for muscle spasms. 20 tablet 0  . furosemide (LASIX) 40 MG tablet Take 40 mg by mouth 2 (two) times daily.  5  .  hydrALAZINE (APRESOLINE) 100 MG tablet Take 1 tablet (100 mg total) by mouth every 8 (eight) hours. 90 tablet 0  . insulin aspart protamine - aspart (NOVOLOG MIX 70/30 FLEXPEN) (70-30) 100 UNIT/ML FlexPen Inject 0.1 mLs (10 Units total) into the skin 2 (two) times daily. 15 mL 11  . labetalol (NORMODYNE) 200 MG tablet Take 200 mg by mouth 2 (two) times daily.    Marland Kitchen losartan (COZAAR) 50 MG tablet Take 1 tablet (50 mg total) by mouth 2 (two) times daily. 180 tablet 0  . Magnesium 250 MG TABS Take 250 mg by mouth daily.     . metFORMIN (GLUCOPHAGE-XR) 500 MG 24 hr tablet Take 1 tablet (500 mg total) by mouth daily.  2  . oxyCODONE-acetaminophen (PERCOCET) 5-325 MG per tablet Take one or two tablets by mouth every 6 hours as needed for pain. 30 tablet 0  . potassium chloride SA (K-DUR,KLOR-CON) 20 MEQ tablet Take 2 tablets (40 mEq total) by mouth daily. 60 tablet 11  . rosuvastatin (CRESTOR) 20 MG tablet Take 20 mg by mouth daily.     No current facility-administered medications for this visit.   Facility-Administered Medications Ordered in Other Visits  Medication Dose Route Frequency Provider Last Rate Last Dose  . chlorhexidine (HIBICLENS) 4 % liquid 2 application  30 mL Topical UD Rexene Alberts, MD  Physical Exam:   BP 139/82 mmHg  Pulse 88  Resp 20  Ht 5' (1.524 m)  Wt 180 lb (81.647 kg)  BMI 35.15 kg/m2  SpO2 90%  General:  Obese but well appearing  Chest:   Clear to auscultation with few bibasilar inspiratory rales  CV:   Regular rate and rhythm with prominent holosystolic murmur  Incisions:  n/a  Abdomen:  Soft and nontender  Extremities:  Warm and well-perfused   Diagnostic Tests:  CT ANGIOGRAPHY CHEST, ABDOMEN AND PELVIS  TECHNIQUE: Multidetector CT imaging through the chest, abdomen and pelvis was performed using the standard protocol during bolus administration of intravenous contrast. Multiplanar reconstructed images and MIPs were obtained and reviewed to  evaluate the vascular anatomy.  CONTRAST: 100 cc Omnipaque 350  COMPARISON: Chest radiograph - 05/31/2015; cardiac MRI -11/25/2019  FINDINGS: CTA CHEST FINDINGS  Vascular Findings of the chest:  Normal caliber the thoracic aorta was measurements as follows. No evidence of thoracic aortic dissection or periaortic stranding on this nongated examination. Review of the precontrast images are negative for the presence of an intramural hematoma.  Bovine configuration of the aortic arch is incidentally noted. The branch vessels of the aortic arch are widely patent throughout their imaged course.  Marked cardiomegaly, in particular, there is enlargement of the left ventricle. Coronary artery calcifications. Small pericardial effusion with fluid tracking to the pericardial recess, decreased since the 10/2014 examination.  Although this examination was not tailored for the evaluation of the pulmonary arteries, there are no discrete filling defects within the central pulmonary arterial tree to suggest central pulmonary embolism. Enlarged caliber the main pulmonary artery measuring 39 mm in diameter (image 58, series 501).  -------------------------------------------------------------  Thoracic aortic measurements:  Sinotubular junction  31 mm as measured in greatest oblique coronal dimension.  Proximal ascending aorta  35 mm as measured in greatest oblique axial dimension at the level of the main pulmonary artery.  Aortic arch aorta  30 mm as measured in greatest oblique sagittal dimension.  Proximal descending thoracic aorta  27 mm as measured in greatest oblique axial dimension at the level of the main pulmonary artery.  Distal descending thoracic aorta  25 mm as measured in greatest oblique axial dimension at the level of the diaphragmatic hiatus.  Review of the MIP images confirms the above  findings.  -------------------------------------------------------------  Non-Vascular Findings of the chest:  Evaluation the pulmonary parenchyma is minimally degraded secondary to patient respiratory artifact. Minimal dependent subpleural ground-glass atelectasis with geographic subsegmental atelectasis within the left lower lobe. No discrete pulmonary nodules given limitation of the examination.  Borderline enlarged right suprahilar lymph node measures approximately 1.2 cm in greatest short axis diameter (image 54, series 501). Additional scattered shotty mediastinal lymph nodes individually not enlarged by size criteria with index right-sided precarinal lymph node measuring 0.8 cm in greatest short axis diameter. No axillary lymphadenopathy.  Regional soft tissues appear normal. Normal appearance of the thyroid gland. No acute or aggressive osseous abnormalities within the chest. Stigmata of DISH within the caudal aspect of the thoracic spine. Degenerative change of the left glenohumeral joint.  -------------------------------------------------------------  CTA ABDOMEN AND PELVIS FINDINGS  Vascular Findings of the abdomen and pelvis:  Abdominal aorta: Scattered minimal amount of mixed calcified and noncalcified atherosclerotic plaque within a normal caliber abdominal aorta, not resulting in a hemodynamically significant stenosis. No abdominal aortic dissection or periaortic stranding.  Celiac artery: There is a minimal amount of eccentric calcified plaque involving the cranial aspect of the  origin of the celiac artery, not resulting in hemodynamically significant stenosis. Conventional branching pattern.  SMA: Widely patent without hemodynamically significant narrowing. The distal tributaries the SMA are widely patent without discrete intraluminal filling defect to suggest distal embolism.  Right Renal artery: Solitary; there is a minimal amount of  eccentric calcified plaque involving the cranial aspect of the origin of the right renal artery, not resulting in hemodynamically significant stenosis. No vessel irregularity to suggest FMD.  Left Renal artery: Solitary; widely patent without hemodynamically significant narrowing. No vessel irregularity to suggest FMD.  IMA: Widely patent.  Pelvic vasculature: There is a minimal amount of eccentric mixed calcified and noncalcified atherosclerotic plaque within the bilateral common and external iliac arteries, not resulting in a hemodynamically significant stenosis. The bilateral common iliac arteries are noted to be mildly tortuous. The bilateral internal iliac arteries are mildly diseased though patent and of normal caliber.  Review of the MIP images confirms the above findings.   --------------------------------------------------------------------------------  Nonvascular Findings of the abdomen and pelvis:  Evaluation of the abdominal organs is limited to the arterial phase of enhancement.  Normal hepatic contour. No discrete hyper enhancing hepatic lesions. Normal appearance of the gallbladder. No radiopaque gallstones. No intra extrahepatic biliary duct dilatation. No ascites.  There is symmetric enhancement of the bilateral kidneys. Note is made of a partially exophytic approximately 4.4 cm hypo attenuating (approximately 10 Hounsfield unit) exophytic cyst arising from the posterior inferior aspect of the right kidney (image 168, series 501). No discrete left-sided renal lesions. No definite renal stones on this postcontrast examination. No urinary obstruction or perinephric stranding. There is mild thickening of the medial limb of the left adrenal gland without discrete nodule. Normal appearance of the right adrenal gland, pancreas and spleen.  Scattered colonic diverticulosis without evidence of diverticulitis. The bowel is otherwise normal in course and  caliber without wall thickening or evidence of obstruction. Normal appearance of the appendix. No pneumoperitoneum, pneumatosis or portal venous gas.  No bulky retroperitoneal, mesenteric, pelvic or inguinal lymphadenopathy.  Note is made of an approximately 0.9 x 0.6 cm crescentic calcification within the left adnexa without discrete associated lesion. Otherwise, normal appearance of the pelvic organs for age. Normal appearance of the urinary bladder given degree distention. No free fluid in the pelvic cul-de-sac.  No acute or aggressive osseous abnormalities.  Small mesenteric fat containing midline ventral wall presumed incisional hernia (image 203, series 501), measuring approximately 2.2 x 1.6 cm in diameter.  Review of the MIP images confirms the above findings.  IMPRESSION: Vascular Impression of the chest:  1. No evidence of thoracic aortic aneurysm or dissection. 2. Cardiomegaly. Coronary artery calcifications. 3. Enlarged caliber of the main pulmonary artery, nonspecific though could be seen in the setting of pulmonary arterial hypertension. 4. Small pericardial effusion, decreased in size since the 10/2014 cardiac MRI. Nonvascular Impression of the chest:  1. Bibasilar atelectasis without acute cardiopulmonary disease. 2. Solitary borderline enlarged right suprahilar lymph node is nonspecific though presumably reactive etiology. No discrete pulmonary nodules given background of atelectasis. ----------------------------------------------------  Vascular Impression of the abdomen and pelvis:  1. Scattered minimal amount of atherosclerotic plaque within a normal caliber abdominal aorta. Nonvascular Impression of the abdomen and pelvis:  1. Scattered minimal colonic diverticulosis without evidence of diverticulitis.   Electronically Signed  By: Sandi Mariscal M.D.  On: 07/01/2015 17:34   Impression:  Patient has stage D severe symptomatic  primary mitral regurgitation with preserved left ventricular systolic function. She presents with gradual  progression of symptoms of shortness of breath, orthopnea, and PND consistent with chronic diastolic congestive heart failure, recently [place] Heart Association class III-IV. She has been hospitalized a total of 3 times since this past December with acute exacerbations of congestive heart failure. I have personally reviewed the patient's recent transthoracic and transesophageal echocardiograms and diagnostic cardiac catheterization. 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 Left ventricular systolic function remains preserved, although the patient does have significant left ventricular hypertrophy and diastolic dysfunction. The patient does not have significant coronary artery disease but right heart catheterization was notable for the presence of severe pulmonary hypertension. Risks associated with surgery will be high because of the patient's numerous comorbid medical problems including morbid obesity, pulmonary hypertension, long-standing hypertension with hypertensive cardiomyopathy, type 2 diabetes mellitus with complications, previous stroke in the remote past, and chronic kidney disease.  Routine preoperative blood work obtained earlier today demonstrates mild acute exacerbation of chronic anemia with hemoglobin that has drifted down to 8.5. Presumably this is related to acute blood loss from the patient's recent dental extraction. The patient's serum creatinine level was also elevated slightly above her baseline at 2.1.    Plan:  The patient and her husband were again counseled at length regarding the indications, risks and potential benefits of mitral valve repair. The rationale for elective surgery has been explained, including a comparison between surgery  and continued medical therapy with close follow-up. The likelihood of successful and durable valve repair has been discussed with particular reference to the findings of their recent echocardiogram. Based upon these findings and previous experience, I have quoted them a greater than 80 percent likelihood of successful valve repair. In the unlikely event that their valve cannot be successfully repaired, we discussed the possibility of replacing the mitral valve using a mechanical prosthesis with the attendant need for long-term anticoagulation versus the alternative of replacing it using a bioprosthetic tissue valve with its potential for late structural valve deterioration and failure, depending upon the patient's longevity. The patient specifically requests that if her mitral valve cannot be satisfactorily repaired she would desire for it to be replaced using a mechanical prosthesis.  The patient understands and accepts all potential risks of surgery including but not limited to risk of death, stroke or other neurologic complication, myocardial infarction, congestive heart failure, respiratory failure, renal failure, bleeding requiring transfusion and/or reexploration, arrhythmia, infection or other wound complications, pneumonia, pleural and/or pericardial effusion, pulmonary embolus, aortic dissection or other major vascular complication, or delayed complications related to valve repair or replacement including but not limited to structural valve deterioration and failure, thrombosis, embolization, endocarditis, or paravalvular leak. Alternative surgical approaches have been discussed including a comparison between conventional sternotomy and minimally-invasive techniques. The relative risks and benefits of each have been reviewed as they pertain to the patient's specific circumstances, and all of their questions have been addressed. Specific risks potentially related to the minimally-invasive approach  were discussed at length, including but not limited to risk of conversion to full or partial sternotomy, aortic dissection or other major vascular complication, unilateral acute lung injury or pulmonary edema, phrenic nerve dysfunction or paralysis, rib fracture, chronic pain, lung hernia, or lymphocele.  We tentatively plan to proceed with surgery on Thursday, 07/08/2015.  We will obtain repeat complete blood count and basic metabolic panel on the morning of surgery to make certain that her  anemia and renal function remained stable. The patient has been instructed to stop taking metformin in anticipation of surgery.  All of their questions have been answered.   I spent in excess of 30 minutes during the conduct of this office consultation and >50% of this time involved direct face-to-face encounter with the patient for counseling and/or coordination of their care.    [name], MD 07/06/2015 4:16 PM

## 2015-07-06 NOTE — Patient Instructions (Signed)
Patient has been instructed to stop taking metformin  Patient should continue taking all other medications without change through the day before surgery.  Patient should have nothing to eat or drink after midnight the night before surgery.  On the morning of surgery patient should take only hydralazine and labetalol with a sip of water.

## 2015-07-06 NOTE — Progress Notes (Signed)
Left a message on Jody Nguyen about H&H

## 2015-07-07 ENCOUNTER — Ambulatory Visit (HOSPITAL_COMMUNITY): Payer: Self-pay | Admitting: Dentistry

## 2015-07-07 ENCOUNTER — Encounter (HOSPITAL_COMMUNITY): Payer: Self-pay | Admitting: Certified Registered Nurse Anesthetist

## 2015-07-07 LAB — HEMOGLOBIN A1C
Hgb A1c MFr Bld: 5.8 % — ABNORMAL HIGH (ref 4.8–5.6)
Mean Plasma Glucose: 120 mg/dL

## 2015-07-07 MED ORDER — SODIUM CHLORIDE 0.9 % IV SOLN
INTRAVENOUS | Status: AC
  Filled 2015-07-07: qty 40

## 2015-07-07 MED ORDER — SODIUM CHLORIDE 0.9 % IV SOLN
1250.0000 mg | INTRAVENOUS | Status: AC
  Administered 2015-07-08: 1250 mg via INTRAVENOUS
  Filled 2015-07-07: qty 1250

## 2015-07-07 MED ORDER — PAPAVERINE HCL 30 MG/ML IJ SOLN
INTRAMUSCULAR | Status: AC
  Administered 2015-07-08: 500 mL
  Filled 2015-07-07: qty 2.5

## 2015-07-07 MED ORDER — DEXTROSE 5 % IV SOLN
750.0000 mg | INTRAVENOUS | Status: AC
  Filled 2015-07-07: qty 750

## 2015-07-07 MED ORDER — DOPAMINE-DEXTROSE 3.2-5 MG/ML-% IV SOLN
0.0000 ug/kg/min | INTRAVENOUS | Status: AC
  Filled 2015-07-07: qty 250

## 2015-07-07 MED ORDER — SODIUM CHLORIDE 0.9 % IV SOLN
INTRAVENOUS | Status: AC
  Filled 2015-07-07 (×2): qty 2.5

## 2015-07-07 MED ORDER — GLUTARALDEHYDE 0.625% SOAKING SOLUTION
TOPICAL | Status: AC
  Filled 2015-07-07: qty 50

## 2015-07-07 MED ORDER — HEPARIN SODIUM (PORCINE) 1000 UNIT/ML IJ SOLN
INTRAMUSCULAR | Status: AC
Start: 2015-07-08 — End: 2015-07-09
  Filled 2015-07-07: qty 30

## 2015-07-07 MED ORDER — DEXMEDETOMIDINE HCL IN NACL 400 MCG/100ML IV SOLN
0.1000 ug/kg/h | INTRAVENOUS | Status: AC
  Filled 2015-07-07 (×3): qty 100

## 2015-07-07 MED ORDER — PHENYLEPHRINE HCL 10 MG/ML IJ SOLN
30.0000 ug/min | INTRAVENOUS | Status: AC
  Administered 2015-07-08: 25 ug/min via INTRAVENOUS
  Filled 2015-07-07: qty 2

## 2015-07-07 MED ORDER — VANCOMYCIN HCL 1000 MG IV SOLR
INTRAVENOUS | Status: AC
  Administered 2015-07-08: 1000 mL
  Filled 2015-07-07: qty 1000

## 2015-07-07 MED ORDER — EPINEPHRINE HCL 1 MG/ML IJ SOLN
0.0000 ug/min | INTRAVENOUS | Status: AC
  Filled 2015-07-07 (×2): qty 4

## 2015-07-07 MED ORDER — MAGNESIUM SULFATE 50 % IJ SOLN
40.0000 meq | INTRAMUSCULAR | Status: AC
  Filled 2015-07-07: qty 10

## 2015-07-07 MED ORDER — POTASSIUM CHLORIDE 2 MEQ/ML IV SOLN
80.0000 meq | INTRAVENOUS | Status: AC
  Filled 2015-07-07: qty 40

## 2015-07-07 MED ORDER — NITROGLYCERIN IN D5W 200-5 MCG/ML-% IV SOLN
2.0000 ug/min | INTRAVENOUS | Status: AC
  Filled 2015-07-07: qty 250

## 2015-07-07 MED ORDER — DEXTROSE 5 % IV SOLN
1.5000 g | INTRAVENOUS | Status: AC
  Filled 2015-07-07 (×2): qty 1.5

## 2015-07-07 MED ORDER — METOPROLOL TARTRATE 12.5 MG HALF TABLET: 12.5000 mg | ORAL_TABLET | ORAL | Status: AC

## 2015-07-07 NOTE — Progress Notes (Addendum)
Anesthesia Chart Review: Patient is a 59 year old female scheduled for minimally invasive MV repair or replacement on 07/08/15 by Dr. Roxy Manns.  History includes non-smoker, severe mitral regurgitation, chronic diastolic CHF, pulmonary hypertension, SOB, HTN, HLD, CVA '97, DM2, CKD stage IV, anemia, previous pericardial effusion (thought possible due to minoxidil). BMI is consistent with obesity. OSA screening score 4. PCP is Dr. Eddie North. Cardiologist is Dr. Stanford Breed.    Meds include amiodarone, ASA, Invokana, clonidine, Lasix, hydralazine, Novolog 70/30, labetalol, losartan, magnesium, metformin, KCL, Crestor, Percocet.  06/18/15 Cardiac cath:  Nonobstructive coronary artery disease (30% mid LAD).  Severe pulmonary artery hypertension. PA saturation 43%. Cardiac index 1.86. Prominent V waves indicative of mitral regurgitation.  Resting hypoxemia with oxygen saturation 86% on room air.  Systemic hypertension  06/08/15 TEE: Normal LV function, EF 55-60%; LVH; moderate to severe LAE; mildly reduced RV function; small pericardial effusion; flail segment of anterior MV leaflet (A2) with severe eccentric MR; mild TR.   06/25/15 EKG: NSR, non-specific T wave abnormality, prolonged QT.  07/06/15 Carotid duplex:  - The vertebral arteries appear patent with antegrade flow. - Findings consistent with 1- 39 percent stenosis involving the right internal carotid artery and the left internal carotid artery. - ICA/CCA ratio. right = 0.97. left = 0.92.  07/06/15 CXR: IMPRESSION: Low-grade CHF, improved since the previous study. Persistent cardiomegaly. A pericardial effusion is not excluded. There is no evidence of pneumonia.  07/06/15 PFTs: FVC 1.19 (53%), FEV1 1.12 (64%), DLCOunc 10.06 (53%).  Preoperative labs noted. Cr 2.10, most recently Cr primarily ~ 1.7-1.88. H/H 8.5/27.1. HGB down from 9.7-11.9 last week (possibly related to blood loss from dental extraction). Results already reviewed by Dr. Roxy Manns. He  has requested that a STAT CBC and BMET be done on arrival. His office has already entered orders. I also entered an order for T&C for 2 Units. Defer decision for transfusion to surgeon and/or anesthesiologist.   If follow-up labs felt acceptable for OR and no acute changes then I would anticipate that she can proceed as planned.  George Hugh [facility] Short Stay Center/Anesthesiology Phone [phone] 07/07/2015 9:50 AM  Addendum: Anesthesiologist Dr. Ola Spurr requesting ISTAT8 be done with AM labs so at least some results will be known more quickly. This will help guide then on whether or not to begin line placement. ISTAT8 order entered.  George Hugh [facility] Short Stay Center/Anesthesiology Phone [phone] 07/07/2015 2:51 PM

## 2015-07-08 ENCOUNTER — Inpatient Hospital Stay (HOSPITAL_COMMUNITY): Payer: BLUE CROSS/BLUE SHIELD | Admitting: Vascular Surgery

## 2015-07-08 ENCOUNTER — Encounter (HOSPITAL_COMMUNITY)
Admission: RE | Disposition: A | Discharge status: Other Acute Inpatient Hospital | Payer: BLUE CROSS/BLUE SHIELD | Source: Ambulatory Visit | Attending: Thoracic Surgery (Cardiothoracic Vascular Surgery)

## 2015-07-08 ENCOUNTER — Inpatient Hospital Stay (HOSPITAL_COMMUNITY): Payer: BLUE CROSS/BLUE SHIELD

## 2015-07-08 ENCOUNTER — Ambulatory Visit: Payer: BLUE CROSS/BLUE SHIELD | Admitting: Cardiology

## 2015-07-08 ENCOUNTER — Encounter (HOSPITAL_COMMUNITY): Payer: Self-pay | Admitting: General Practice

## 2015-07-08 ENCOUNTER — Inpatient Hospital Stay (HOSPITAL_COMMUNITY)
Admission: RE | Admit: 2015-07-08 | Discharge: 2015-07-08 | DRG: 003 | Disposition: A | Discharge status: Other Acute Inpatient Hospital | Payer: BLUE CROSS/BLUE SHIELD | Source: Ambulatory Visit | Attending: Thoracic Surgery (Cardiothoracic Vascular Surgery) | Admitting: Thoracic Surgery (Cardiothoracic Vascular Surgery)

## 2015-07-08 DIAGNOSIS — I5033 Acute on chronic diastolic (congestive) heart failure: Secondary | ICD-10-CM | POA: Diagnosis not present

## 2015-07-08 DIAGNOSIS — I313 Pericardial effusion (noninflammatory): Secondary | ICD-10-CM | POA: Diagnosis present

## 2015-07-08 DIAGNOSIS — E559 Vitamin D deficiency, unspecified: Secondary | ICD-10-CM | POA: Diagnosis present

## 2015-07-08 DIAGNOSIS — Z9889 Other specified postprocedural states: Secondary | ICD-10-CM

## 2015-07-08 DIAGNOSIS — E1122 Type 2 diabetes mellitus with diabetic chronic kidney disease: Secondary | ICD-10-CM | POA: Diagnosis present

## 2015-07-08 DIAGNOSIS — I272 Other secondary pulmonary hypertension: Secondary | ICD-10-CM | POA: Diagnosis present

## 2015-07-08 DIAGNOSIS — J95821 Acute postprocedural respiratory failure: Secondary | ICD-10-CM | POA: Diagnosis not present

## 2015-07-08 DIAGNOSIS — I469 Cardiac arrest, cause unspecified: Secondary | ICD-10-CM | POA: Diagnosis not present

## 2015-07-08 DIAGNOSIS — J811 Chronic pulmonary edema: Secondary | ICD-10-CM

## 2015-07-08 DIAGNOSIS — Z888 Allergy status to other drugs, medicaments and biological substances status: Secondary | ICD-10-CM | POA: Diagnosis not present

## 2015-07-08 DIAGNOSIS — I2510 Atherosclerotic heart disease of native coronary artery without angina pectoris: Secondary | ICD-10-CM | POA: Diagnosis present

## 2015-07-08 DIAGNOSIS — Z7982 Long term (current) use of aspirin: Secondary | ICD-10-CM | POA: Diagnosis not present

## 2015-07-08 DIAGNOSIS — D631 Anemia in chronic kidney disease: Secondary | ICD-10-CM | POA: Diagnosis present

## 2015-07-08 DIAGNOSIS — Z794 Long term (current) use of insulin: Secondary | ICD-10-CM

## 2015-07-08 DIAGNOSIS — J9819 Other pulmonary collapse: Secondary | ICD-10-CM

## 2015-07-08 DIAGNOSIS — N184 Chronic kidney disease, stage 4 (severe): Secondary | ICD-10-CM | POA: Diagnosis present

## 2015-07-08 DIAGNOSIS — Z823 Family history of stroke: Secondary | ICD-10-CM

## 2015-07-08 DIAGNOSIS — E669 Obesity, unspecified: Secondary | ICD-10-CM | POA: Diagnosis present

## 2015-07-08 DIAGNOSIS — Z8249 Family history of ischemic heart disease and other diseases of the circulatory system: Secondary | ICD-10-CM

## 2015-07-08 DIAGNOSIS — N189 Chronic kidney disease, unspecified: Secondary | ICD-10-CM

## 2015-07-08 DIAGNOSIS — Z6836 Body mass index (BMI) 36.0-36.9, adult: Secondary | ICD-10-CM | POA: Diagnosis not present

## 2015-07-08 DIAGNOSIS — J80 Acute respiratory distress syndrome: Secondary | ICD-10-CM | POA: Diagnosis not present

## 2015-07-08 DIAGNOSIS — I5042 Chronic combined systolic (congestive) and diastolic (congestive) heart failure: Secondary | ICD-10-CM | POA: Diagnosis present

## 2015-07-08 DIAGNOSIS — Z79899 Other long term (current) drug therapy: Secondary | ICD-10-CM | POA: Diagnosis not present

## 2015-07-08 DIAGNOSIS — K5730 Diverticulosis of large intestine without perforation or abscess without bleeding: Secondary | ICD-10-CM | POA: Diagnosis present

## 2015-07-08 DIAGNOSIS — I340 Nonrheumatic mitral (valve) insufficiency: Secondary | ICD-10-CM | POA: Diagnosis present

## 2015-07-08 DIAGNOSIS — E785 Hyperlipidemia, unspecified: Secondary | ICD-10-CM | POA: Diagnosis present

## 2015-07-08 DIAGNOSIS — I130 Hypertensive heart and chronic kidney disease with heart failure and stage 1 through stage 4 chronic kidney disease, or unspecified chronic kidney disease: Secondary | ICD-10-CM | POA: Diagnosis present

## 2015-07-08 DIAGNOSIS — E119 Type 2 diabetes mellitus without complications: Secondary | ICD-10-CM | POA: Diagnosis present

## 2015-07-08 DIAGNOSIS — J9600 Acute respiratory failure, unspecified whether with hypoxia or hypercapnia: Secondary | ICD-10-CM | POA: Diagnosis not present

## 2015-07-08 DIAGNOSIS — Z8673 Personal history of transient ischemic attack (TIA), and cerebral infarction without residual deficits: Secondary | ICD-10-CM

## 2015-07-08 DIAGNOSIS — E1129 Type 2 diabetes mellitus with other diabetic kidney complication: Secondary | ICD-10-CM

## 2015-07-08 DIAGNOSIS — I5032 Chronic diastolic (congestive) heart failure: Secondary | ICD-10-CM | POA: Diagnosis present

## 2015-07-08 DIAGNOSIS — I10 Essential (primary) hypertension: Secondary | ICD-10-CM | POA: Diagnosis present

## 2015-07-08 HISTORY — PX: CANNULATION FOR CARDIOPULMONARY BYPASS: SHX6411

## 2015-07-08 HISTORY — DX: Acute respiratory failure, unspecified whether with hypoxia or hypercapnia: J96.00

## 2015-07-08 HISTORY — DX: Other specified postprocedural states: Z98.890

## 2015-07-08 HISTORY — PX: MITRAL VALVE REPAIR: SHX2039

## 2015-07-08 HISTORY — PX: TEE WITHOUT CARDIOVERSION: SHX5443

## 2015-07-08 LAB — POCT I-STAT, CHEM 8
BUN: 29 mg/dL — ABNORMAL HIGH (ref 6–20)
BUN: 25 mg/dL — ABNORMAL HIGH (ref 6–20)
BUN: 24 mg/dL — ABNORMAL HIGH (ref 6–20)
BUN: 23 mg/dL — ABNORMAL HIGH (ref 6–20)
BUN: 23 mg/dL — ABNORMAL HIGH (ref 6–20)
BUN: 22 mg/dL — ABNORMAL HIGH (ref 6–20)
BUN: 22 mg/dL — ABNORMAL HIGH (ref 6–20)
BUN: 22 mg/dL — ABNORMAL HIGH (ref 6–20)
BUN: 22 mg/dL — ABNORMAL HIGH (ref 6–20)
Calcium, Ion: 1.16 mmol/L (ref 1.13–1.30)
Calcium, Ion: 1.23 mmol/L (ref 1.13–1.30)
Calcium, Ion: 1.25 mmol/L (ref 1.13–1.30)
Calcium, Ion: 1.05 mmol/L — ABNORMAL LOW (ref 1.13–1.30)
Calcium, Ion: 1.13 mmol/L (ref 1.13–1.30)
Calcium, Ion: 1.12 mmol/L — ABNORMAL LOW (ref 1.13–1.30)
Calcium, Ion: 1.13 mmol/L (ref 1.13–1.30)
Calcium, Ion: 1.56 mmol/L — ABNORMAL HIGH (ref 1.13–1.30)
Calcium, Ion: 1.49 mmol/L — ABNORMAL HIGH (ref 1.13–1.30)
Chloride: 98 mmol/L — ABNORMAL LOW (ref 101–111)
Chloride: 100 mmol/L — ABNORMAL LOW (ref 101–111)
Chloride: 99 mmol/L — ABNORMAL LOW (ref 101–111)
Chloride: 100 mmol/L — ABNORMAL LOW (ref 101–111)
Chloride: 98 mmol/L — ABNORMAL LOW (ref 101–111)
Chloride: 102 mmol/L (ref 101–111)
Chloride: 103 mmol/L (ref 101–111)
Chloride: 104 mmol/L (ref 101–111)
Chloride: 101 mmol/L (ref 101–111)
Creatinine, Ser: 2.1 mg/dL — ABNORMAL HIGH (ref 0.44–1.00)
Creatinine, Ser: 1.9 mg/dL — ABNORMAL HIGH (ref 0.44–1.00)
Creatinine, Ser: 1.8 mg/dL — ABNORMAL HIGH (ref 0.44–1.00)
Creatinine, Ser: 1.6 mg/dL — ABNORMAL HIGH (ref 0.44–1.00)
Creatinine, Ser: 1.6 mg/dL — ABNORMAL HIGH (ref 0.44–1.00)
Creatinine, Ser: 1.4 mg/dL — ABNORMAL HIGH (ref 0.44–1.00)
Creatinine, Ser: 1.5 mg/dL — ABNORMAL HIGH (ref 0.44–1.00)
Creatinine, Ser: 1.6 mg/dL — ABNORMAL HIGH (ref 0.44–1.00)
Creatinine, Ser: 1.5 mg/dL — ABNORMAL HIGH (ref 0.44–1.00)
Glucose, Bld: 161 mg/dL — ABNORMAL HIGH (ref 65–99)
Glucose, Bld: 162 mg/dL — ABNORMAL HIGH (ref 65–99)
Glucose, Bld: 157 mg/dL — ABNORMAL HIGH (ref 65–99)
Glucose, Bld: 161 mg/dL — ABNORMAL HIGH (ref 65–99)
Glucose, Bld: 143 mg/dL — ABNORMAL HIGH (ref 65–99)
Glucose, Bld: 127 mg/dL — ABNORMAL HIGH (ref 65–99)
Glucose, Bld: 176 mg/dL — ABNORMAL HIGH (ref 65–99)
Glucose, Bld: 184 mg/dL — ABNORMAL HIGH (ref 65–99)
Glucose, Bld: 288 mg/dL — ABNORMAL HIGH (ref 65–99)
HCT: 28 % — ABNORMAL LOW (ref 36.0–46.0)
HCT: 24 % — ABNORMAL LOW (ref 36.0–46.0)
HCT: 22 % — ABNORMAL LOW (ref 36.0–46.0)
HCT: 24 % — ABNORMAL LOW (ref 36.0–46.0)
HCT: 23 % — ABNORMAL LOW (ref 36.0–46.0)
HCT: 22 % — ABNORMAL LOW (ref 36.0–46.0)
HCT: 32 % — ABNORMAL LOW (ref 36.0–46.0)
HCT: 37 % (ref 36.0–46.0)
HCT: 34 % — ABNORMAL LOW (ref 36.0–46.0)
Hemoglobin: 9.5 g/dL — ABNORMAL LOW (ref 12.0–15.0)
Hemoglobin: 8.2 g/dL — ABNORMAL LOW (ref 12.0–15.0)
Hemoglobin: 7.5 g/dL — ABNORMAL LOW (ref 12.0–15.0)
Hemoglobin: 8.2 g/dL — ABNORMAL LOW (ref 12.0–15.0)
Hemoglobin: 7.8 g/dL — ABNORMAL LOW (ref 12.0–15.0)
Hemoglobin: 7.5 g/dL — ABNORMAL LOW (ref 12.0–15.0)
Hemoglobin: 10.9 g/dL — ABNORMAL LOW (ref 12.0–15.0)
Hemoglobin: 12.6 g/dL (ref 12.0–15.0)
Hemoglobin: 11.6 g/dL — ABNORMAL LOW (ref 12.0–15.0)
Potassium: 3.2 mmol/L — ABNORMAL LOW (ref 3.5–5.1)
Potassium: 3 mmol/L — ABNORMAL LOW (ref 3.5–5.1)
Potassium: 3 mmol/L — ABNORMAL LOW (ref 3.5–5.1)
Potassium: 3.3 mmol/L — ABNORMAL LOW (ref 3.5–5.1)
Potassium: 3.7 mmol/L (ref 3.5–5.1)
Potassium: 3.7 mmol/L (ref 3.5–5.1)
Potassium: 3.2 mmol/L — ABNORMAL LOW (ref 3.5–5.1)
Potassium: 3.3 mmol/L — ABNORMAL LOW (ref 3.5–5.1)
Potassium: 2.9 mmol/L — ABNORMAL LOW (ref 3.5–5.1)
Sodium: 139 mmol/L (ref 135–145)
Sodium: 139 mmol/L (ref 135–145)
Sodium: 140 mmol/L (ref 135–145)
Sodium: 140 mmol/L (ref 135–145)
Sodium: 136 mmol/L (ref 135–145)
Sodium: 137 mmol/L (ref 135–145)
Sodium: 140 mmol/L (ref 135–145)
Sodium: 142 mmol/L (ref 135–145)
Sodium: 146 mmol/L — ABNORMAL HIGH (ref 135–145)
TCO2: 23 mmol/L (ref 0–100)
TCO2: 26 mmol/L (ref 0–100)
TCO2: 25 mmol/L (ref 0–100)
TCO2: 26 mmol/L (ref 0–100)
TCO2: 25 mmol/L (ref 0–100)
TCO2: 24 mmol/L (ref 0–100)
TCO2: 21 mmol/L (ref 0–100)
TCO2: 24 mmol/L (ref 0–100)
TCO2: 28 mmol/L (ref 0–100)

## 2015-07-08 LAB — BASIC METABOLIC PANEL
Anion gap: 11 (ref 5–15)
BUN: 27 mg/dL — ABNORMAL HIGH (ref 6–20)
CO2: 28 mmol/L (ref 22–32)
Calcium: 9.3 mg/dL (ref 8.9–10.3)
Chloride: 99 mmol/L — ABNORMAL LOW (ref 101–111)
Creatinine, Ser: 2.19 mg/dL — ABNORMAL HIGH (ref 0.44–1.00)
GFR calc Af Amer: 27 mL/min — ABNORMAL LOW (ref >60)
GFR calc non Af Amer: 23 mL/min — ABNORMAL LOW (ref >60)
Glucose, Bld: 158 mg/dL — ABNORMAL HIGH (ref 65–99)
Potassium: 3.3 mmol/L — ABNORMAL LOW (ref 3.5–5.1)
Sodium: 138 mmol/L (ref 135–145)

## 2015-07-08 LAB — COMPREHENSIVE METABOLIC PANEL
ALT: 8 U/L — ABNORMAL LOW (ref 14–54)
AST: 38 U/L (ref 15–41)
Albumin: 2.2 g/dL — ABNORMAL LOW (ref 3.5–5.0)
Alkaline Phosphatase: 24 U/L — ABNORMAL LOW (ref 38–126)
Anion gap: 11 (ref 5–15)
BUN: 18 mg/dL (ref 6–20)
CO2: 23 mmol/L (ref 22–32)
Calcium: 7 mg/dL — ABNORMAL LOW (ref 8.9–10.3)
Chloride: 109 mmol/L (ref 101–111)
Creatinine, Ser: 1.64 mg/dL — ABNORMAL HIGH (ref 0.44–1.00)
GFR calc Af Amer: 38 mL/min — ABNORMAL LOW (ref >60)
GFR calc non Af Amer: 33 mL/min — ABNORMAL LOW (ref >60)
Glucose, Bld: 121 mg/dL — ABNORMAL HIGH (ref 65–99)
Potassium: 3.2 mmol/L — ABNORMAL LOW (ref 3.5–5.1)
Sodium: 143 mmol/L (ref 135–145)
Total Bilirubin: 1 mg/dL (ref 0.3–1.2)
Total Protein: 3.4 g/dL — ABNORMAL LOW (ref 6.5–8.1)

## 2015-07-08 LAB — POCT I-STAT 3, ART BLOOD GAS (G3+)
Acid-Base Excess: 3 mmol/L — ABNORMAL HIGH (ref 0.0–2.0)
Acid-base deficit: 4 mmol/L — ABNORMAL HIGH (ref 0.0–2.0)
Acid-base deficit: 4 mmol/L — ABNORMAL HIGH (ref 0.0–2.0)
Acid-base deficit: 2 mmol/L (ref 0.0–2.0)
Bicarbonate: 26.8 mEq/L — ABNORMAL HIGH (ref 20.0–24.0)
Bicarbonate: 23.4 mEq/L (ref 20.0–24.0)
Bicarbonate: 24.7 mEq/L — ABNORMAL HIGH (ref 20.0–24.0)
Bicarbonate: 27.5 mEq/L — ABNORMAL HIGH (ref 20.0–24.0)
O2 Saturation: 100 %
O2 Saturation: 79 %
O2 Saturation: 93 %
O2 Saturation: 73 %
TCO2: 28 mmol/L (ref 0–100)
TCO2: 25 mmol/L (ref 0–100)
TCO2: 27 mmol/L (ref 0–100)
TCO2: 30 mmol/L (ref 0–100)
pCO2 arterial: 39.1 mmHg (ref 35.0–45.0)
pCO2 arterial: 53.1 mmHg — ABNORMAL HIGH (ref 35.0–45.0)
pCO2 arterial: 62.8 mmHg (ref 35.0–45.0)
pCO2 arterial: 74.6 mmHg (ref 35.0–45.0)
pH, Arterial: 7.444 (ref 7.350–7.450)
pH, Arterial: 7.202 — ABNORMAL LOW (ref 7.350–7.450)
pH, Arterial: 7.253 — ABNORMAL LOW (ref 7.350–7.450)
pH, Arterial: 7.174 — CL (ref 7.350–7.450)
pO2, Arterial: 373 mmHg — ABNORMAL HIGH (ref 80.0–100.0)
pO2, Arterial: 51 mmHg — ABNORMAL LOW (ref 80.0–100.0)
pO2, Arterial: 85 mmHg (ref 80.0–100.0)
pO2, Arterial: 50 mmHg — ABNORMAL LOW (ref 80.0–100.0)

## 2015-07-08 LAB — CBC
HCT: 25.7 % — ABNORMAL LOW (ref 36.0–46.0)
HCT: 25.5 % — ABNORMAL LOW (ref 36.0–46.0)
Hemoglobin: 8.2 g/dL — ABNORMAL LOW (ref 12.0–15.0)
Hemoglobin: 8.5 g/dL — ABNORMAL LOW (ref 12.0–15.0)
MCH: 26.2 pg (ref 26.0–34.0)
MCH: 27.4 pg (ref 26.0–34.0)
MCHC: 31.9 g/dL (ref 30.0–36.0)
MCHC: 33.3 g/dL (ref 30.0–36.0)
MCV: 82.1 fL (ref 78.0–100.0)
MCV: 82.3 fL (ref 78.0–100.0)
Platelets: 317 10*3/uL (ref 150–400)
Platelets: 146 10*3/uL — ABNORMAL LOW (ref 150–400)
RBC: 3.13 MIL/uL — ABNORMAL LOW (ref 3.87–5.11)
RBC: 3.1 MIL/uL — ABNORMAL LOW (ref 3.87–5.11)
RDW: 16.1 % — ABNORMAL HIGH (ref 11.5–15.5)
RDW: 15.5 % (ref 11.5–15.5)
WBC: 5 10*3/uL (ref 4.0–10.5)
WBC: 11.4 10*3/uL — ABNORMAL HIGH (ref 4.0–10.5)

## 2015-07-08 LAB — HEMOGLOBIN AND HEMATOCRIT, BLOOD
HCT: 20.5 % — ABNORMAL LOW (ref 36.0–46.0)
Hemoglobin: 6.7 g/dL — CL (ref 12.0–15.0)

## 2015-07-08 LAB — GLUCOSE, CAPILLARY: Glucose-Capillary: 151 mg/dL — ABNORMAL HIGH (ref 65–99)

## 2015-07-08 LAB — LACTIC ACID, PLASMA: Lactic Acid, Venous: 2.7 mmol/L (ref 0.5–2.0)

## 2015-07-08 LAB — PLATELET COUNT: Platelets: 179 10*3/uL (ref 150–400)

## 2015-07-08 LAB — PREPARE RBC (CROSSMATCH)

## 2015-07-08 SURGERY — REPAIR, MITRAL VALVE, MINIMALLY INVASIVE
Anesthesia: General | Site: Chest | Laterality: Right

## 2015-07-08 MED ORDER — NITROGLYCERIN IN D5W 200-5 MCG/ML-% IV SOLN
INTRAVENOUS | Status: DC | PRN
  Administered 2015-07-08: 16.6 ug/min via INTRAVENOUS

## 2015-07-08 MED ORDER — FENTANYL CITRATE (PF) 250 MCG/5ML IJ SOLN
INTRAMUSCULAR | Status: AC
  Filled 2015-07-08: qty 5

## 2015-07-08 MED ORDER — DEXMEDETOMIDINE HCL IN NACL 200 MCG/50ML IV SOLN
INTRAVENOUS | Status: AC
  Filled 2015-07-08: qty 50

## 2015-07-08 MED ORDER — PHENYLEPHRINE HCL 10 MG/ML IJ SOLN
0.0000 ug/min | INTRAVENOUS | Status: DC
  Filled 2015-07-08 (×2): qty 2

## 2015-07-08 MED ORDER — SODIUM CHLORIDE 0.9 % IV SOLN
INTRAVENOUS | Status: DC | PRN
  Administered 2015-07-08 (×5): via INTRAVENOUS

## 2015-07-08 MED ORDER — DEXTROSE 5 % IV SOLN
1.5000 g | INTRAVENOUS | Status: DC | PRN
  Administered 2015-07-08: 1.5 g via INTRAVENOUS

## 2015-07-08 MED ORDER — FUROSEMIDE 10 MG/ML IJ SOLN
INTRAMUSCULAR | Status: DC | PRN
  Administered 2015-07-08 (×2): 80 mg via INTRAMUSCULAR

## 2015-07-08 MED ORDER — DOPAMINE-DEXTROSE 3.2-5 MG/ML-% IV SOLN
INTRAVENOUS | Status: DC | PRN
  Administered 2015-07-08: 3 ug/kg/min via INTRAVENOUS

## 2015-07-08 MED ORDER — METHYLPREDNISOLONE SODIUM SUCC 125 MG IJ SOLR
INTRAMUSCULAR | Status: DC | PRN
  Administered 2015-07-08: 125 mg via INTRAVENOUS

## 2015-07-08 MED ORDER — ALBUTEROL SULFATE HFA 108 (90 BASE) MCG/ACT IN AERS
INHALATION_SPRAY | RESPIRATORY_TRACT | Status: AC
  Filled 2015-07-08: qty 6.7

## 2015-07-08 MED ORDER — LACTATED RINGERS IV SOLN
INTRAVENOUS | Status: DC | PRN
  Administered 2015-07-08: 08:00:00 via INTRAVENOUS

## 2015-07-08 MED ORDER — CEFUROXIME SODIUM 750 MG IJ SOLR
INTRAMUSCULAR | Status: DC | PRN
  Administered 2015-07-08 (×2): 750 mg via INTRAMUSCULAR

## 2015-07-08 MED ORDER — MIDAZOLAM HCL 2 MG/2ML IJ SOLN
INTRAMUSCULAR | Status: AC
  Filled 2015-07-08: qty 4

## 2015-07-08 MED ORDER — PROPOFOL 10 MG/ML IV BOLUS
INTRAVENOUS | Status: DC | PRN
  Administered 2015-07-08: 50 mg via INTRAVENOUS

## 2015-07-08 MED ORDER — LIDOCAINE HCL (CARDIAC) 20 MG/ML IV SOLN
INTRAVENOUS | Status: AC
  Filled 2015-07-08: qty 5

## 2015-07-08 MED ORDER — POTASSIUM CHLORIDE 10 MEQ/50ML IV SOLN
10.0000 meq | INTRAVENOUS | Status: AC
  Filled 2015-07-08: qty 50

## 2015-07-08 MED ORDER — PROTAMINE SULFATE 10 MG/ML IV SOLN
INTRAVENOUS | Status: DC | PRN
  Administered 2015-07-08: 200 mg via INTRAVENOUS

## 2015-07-08 MED ORDER — MIDAZOLAM HCL 5 MG/5ML IJ SOLN
INTRAMUSCULAR | Status: DC | PRN
  Administered 2015-07-08: 1 mg via INTRAVENOUS
  Administered 2015-07-08 (×8): 2 mg via INTRAVENOUS
  Administered 2015-07-08: 1 mg via INTRAVENOUS
  Administered 2015-07-08: 2 mg via INTRAVENOUS
  Administered 2015-07-08 (×2): 1 mg via INTRAVENOUS

## 2015-07-08 MED ORDER — LACTATED RINGERS IV SOLN
INTRAVENOUS | Status: DC | PRN
  Administered 2015-07-08: 07:00:00 via INTRAVENOUS

## 2015-07-08 MED ORDER — SODIUM BICARBONATE 8.4 % IV SOLN
INTRAVENOUS | Status: AC
  Filled 2015-07-08: qty 50

## 2015-07-08 MED ORDER — EPINEPHRINE HCL 0.1 MG/ML IJ SOSY
PREFILLED_SYRINGE | INTRAMUSCULAR | Status: AC
  Filled 2015-07-08: qty 10

## 2015-07-08 MED ORDER — CISATRACURIUM BESYLATE 20 MG/10ML IV SOLN
INTRAVENOUS | Status: AC
  Filled 2015-07-08: qty 20

## 2015-07-08 MED ORDER — MILRINONE IN DEXTROSE 20 MG/100ML IV SOLN
0.1250 ug/kg/min | INTRAVENOUS | Status: DC
  Filled 2015-07-08 (×2): qty 100

## 2015-07-08 MED ORDER — DEXTROSE 5 % IV SOLN
INTRAVENOUS | Status: AC
  Filled 2015-07-08 (×2): qty 250

## 2015-07-08 MED ORDER — HEPARIN SODIUM (PORCINE) 1000 UNIT/ML IJ SOLN
INTRAMUSCULAR | Status: DC | PRN
  Administered 2015-07-08 (×2): 26 mL via INTRAVENOUS

## 2015-07-08 MED ORDER — SODIUM BICARBONATE 8.4 % IV SOLN
INTRAVENOUS | Status: DC | PRN
  Administered 2015-07-08 (×3): 50 meq via INTRAVENOUS
  Administered 2015-07-08: 100 meq via INTRAVENOUS
  Administered 2015-07-08 (×2): 50 meq via INTRAVENOUS

## 2015-07-08 MED ORDER — ALBUMIN HUMAN 5 % IV SOLN
INTRAVENOUS | Status: DC | PRN
  Administered 2015-07-08 (×3): via INTRAVENOUS

## 2015-07-08 MED ORDER — FUROSEMIDE 10 MG/ML IJ SOLN
INTRAMUSCULAR | Status: AC
  Filled 2015-07-08: qty 8

## 2015-07-08 MED ORDER — HEPARIN SODIUM (PORCINE) 1000 UNIT/ML IJ SOLN
INTRAMUSCULAR | Status: AC
  Filled 2015-07-08: qty 1

## 2015-07-08 MED ORDER — AMINOCAPROIC ACID 250 MG/ML IV SOLN
10.0000 g | INTRAVENOUS | Status: DC | PRN
  Administered 2015-07-08: 5 g/h via INTRAVENOUS

## 2015-07-08 MED ORDER — 0.9 % SODIUM CHLORIDE (POUR BTL) OPTIME
TOPICAL | Status: DC | PRN
Start: 2015-07-08 — End: 2015-07-08
  Administered 2015-07-08: 6000 mL

## 2015-07-08 MED ORDER — METHYLPREDNISOLONE SODIUM SUCC 125 MG IJ SOLR
INTRAMUSCULAR | Status: AC
  Filled 2015-07-08: qty 2

## 2015-07-08 MED ORDER — LIDOCAINE HCL (CARDIAC) 20 MG/ML IV SOLN
INTRAVENOUS | Status: DC | PRN
  Administered 2015-07-08: 50 mg via INTRAVENOUS

## 2015-07-08 MED ORDER — PHENYLEPHRINE HCL 10 MG/ML IJ SOLN
10.0000 mg | INTRAVENOUS | Status: DC | PRN
  Administered 2015-07-08: 15 ug/min via INTRAVENOUS

## 2015-07-08 MED ORDER — MIDAZOLAM HCL 10 MG/2ML IJ SOLN
INTRAMUSCULAR | Status: AC
  Filled 2015-07-08: qty 4

## 2015-07-08 MED ORDER — EPINEPHRINE HCL 0.1 MG/ML IJ SOSY
PREFILLED_SYRINGE | INTRAMUSCULAR | Status: DC | PRN
  Administered 2015-07-08 (×4): 1 mg via INTRAVENOUS

## 2015-07-08 MED ORDER — EPINEPHRINE HCL 1 MG/ML IJ SOLN: 4000.0000 ug | INTRAMUSCULAR | Status: DC | PRN

## 2015-07-08 MED ORDER — SODIUM CHLORIDE 0.9 % IV SOLN
200.0000 ug | INTRAVENOUS | Status: DC | PRN
  Administered 2015-07-08: .3 ug/kg/h via INTRAVENOUS

## 2015-07-08 MED ORDER — CISATRACURIUM BESYLATE (PF) 10 MG/5ML IV SOLN: INTRAVENOUS | Status: DC | PRN

## 2015-07-08 MED ORDER — PHENYLEPHRINE 40 MCG/ML (10ML) SYRINGE FOR IV PUSH (FOR BLOOD PRESSURE SUPPORT)
PREFILLED_SYRINGE | INTRAVENOUS | Status: AC
  Filled 2015-07-08: qty 30

## 2015-07-08 MED ORDER — EPHEDRINE SULFATE 50 MG/ML IJ SOLN
INTRAMUSCULAR | Status: DC | PRN
  Administered 2015-07-08: 10 mg via INTRAVENOUS

## 2015-07-08 MED ORDER — SODIUM CHLORIDE 0.9 % IV SOLN
INTRAVENOUS | Status: DC | PRN
  Administered 2015-07-08 (×2): via INTRAVENOUS

## 2015-07-08 MED ORDER — PHENYLEPHRINE 40 MCG/ML (10ML) SYRINGE FOR IV PUSH (FOR BLOOD PRESSURE SUPPORT)
PREFILLED_SYRINGE | INTRAVENOUS | Status: AC
  Filled 2015-07-08: qty 10

## 2015-07-08 MED ORDER — MILRINONE IN DEXTROSE 20 MG/100ML IV SOLN
INTRAVENOUS | Status: DC | PRN
  Administered 2015-07-08: .3 ug/kg/min via INTRAVENOUS

## 2015-07-08 MED ORDER — NOREPINEPHRINE BITARTRATE 1 MG/ML IV SOLN
0.0000 ug/min | INTRAVENOUS | Status: AC
  Administered 2015-07-08: 10 ug/min via INTRAVENOUS
  Filled 2015-07-08 (×2): qty 4

## 2015-07-08 MED ORDER — NOREPINEPHRINE BITARTRATE 1 MG/ML IV SOLN
0.0000 ug/min | INTRAVENOUS | Status: AC
  Filled 2015-07-08: qty 16

## 2015-07-08 MED ORDER — PHENYLEPHRINE HCL 10 MG/ML IJ SOLN
INTRAMUSCULAR | Status: DC | PRN
  Administered 2015-07-08 (×4): 80 ug via INTRAVENOUS
  Administered 2015-07-08: 40 ug via INTRAVENOUS
  Administered 2015-07-08 (×3): 80 ug via INTRAVENOUS
  Administered 2015-07-08 (×2): 120 ug via INTRAVENOUS

## 2015-07-08 MED ORDER — SODIUM CHLORIDE 0.9 % IV SOLN
INTRAVENOUS | Status: DC | PRN
  Administered 2015-07-08 (×3): via INTRAVENOUS

## 2015-07-08 MED ORDER — HEPARIN SODIUM (PORCINE) 1000 UNIT/ML IJ SOLN
INTRAMUSCULAR | Status: AC
  Filled 2015-07-08: qty 3

## 2015-07-08 MED ORDER — FENTANYL CITRATE (PF) 100 MCG/2ML IJ SOLN
INTRAMUSCULAR | Status: DC | PRN
  Administered 2015-07-08: 50 ug via INTRAVENOUS
  Administered 2015-07-08 (×2): 100 ug via INTRAVENOUS
  Administered 2015-07-08: 50 ug via INTRAVENOUS
  Administered 2015-07-08: 250 ug via INTRAVENOUS
  Administered 2015-07-08: 100 ug via INTRAVENOUS
  Administered 2015-07-08: 50 ug via INTRAVENOUS
  Administered 2015-07-08: 100 ug via INTRAVENOUS
  Administered 2015-07-08: 50 ug via INTRAVENOUS
  Administered 2015-07-08: 150 ug via INTRAVENOUS
  Administered 2015-07-08: 100 ug via INTRAVENOUS
  Administered 2015-07-08: 50 ug via INTRAVENOUS
  Administered 2015-07-08 (×2): 100 ug via INTRAVENOUS
  Administered 2015-07-08: 150 ug via INTRAVENOUS

## 2015-07-08 MED ORDER — VASOPRESSIN 20 UNIT/ML IV SOLN
0.0300 [IU]/min | INTRAVENOUS | Status: AC
  Administered 2015-07-08: 0.04 [IU]/min via INTRAVENOUS
  Filled 2015-07-08 (×2): qty 2

## 2015-07-08 MED ORDER — INSULIN REGULAR HUMAN 100 UNIT/ML IJ SOLN
250.0000 [IU] | INTRAMUSCULAR | Status: DC | PRN
  Administered 2015-07-08: 2 [IU]/h via INTRAVENOUS

## 2015-07-08 MED ORDER — CALCIUM CHLORIDE 10 % IV SOLN
INTRAVENOUS | Status: DC | PRN
  Administered 2015-07-08 (×2): 1000 mg via INTRAVENOUS
  Administered 2015-07-08: 600 mg via INTRAVENOUS
  Administered 2015-07-08: 1000 mg via INTRAVENOUS

## 2015-07-08 MED ORDER — CISATRACURIUM BESYLATE (PF) 10 MG/5ML IV SOLN
INTRAVENOUS | Status: DC | PRN
  Administered 2015-07-08: 10000 ug via INTRAVENOUS
  Administered 2015-07-08: 14000 ug via INTRAVENOUS
  Administered 2015-07-08 (×8): 10000 ug via INTRAVENOUS
  Administered 2015-07-08: 6000 ug via INTRAVENOUS
  Administered 2015-07-08: 10000 ug via INTRAVENOUS

## 2015-07-08 MED ORDER — EPINEPHRINE HCL 1 MG/ML IJ SOLN
4000.0000 ug | INTRAVENOUS | Status: DC | PRN
  Administered 2015-07-08: 5 ug/min via INTRAVENOUS

## 2015-07-08 MED ORDER — PROPOFOL 10 MG/ML IV BOLUS
INTRAVENOUS | Status: AC
  Filled 2015-07-08: qty 20

## 2015-07-08 MED ORDER — SUCCINYLCHOLINE CHLORIDE 20 MG/ML IJ SOLN
INTRAMUSCULAR | Status: AC
  Filled 2015-07-08: qty 1

## 2015-07-08 MED ORDER — ALBUTEROL SULFATE HFA 108 (90 BASE) MCG/ACT IN AERS
INHALATION_SPRAY | RESPIRATORY_TRACT | Status: DC | PRN
  Administered 2015-07-08: 2 via RESPIRATORY_TRACT

## 2015-07-08 MED ORDER — PROTAMINE SULFATE 10 MG/ML IV SOLN
INTRAVENOUS | Status: AC
  Filled 2015-07-08: qty 25

## 2015-07-08 MED ORDER — CALCIUM CHLORIDE 10 % IV SOLN
INTRAVENOUS | Status: AC
  Filled 2015-07-08: qty 10

## 2015-07-08 MED ORDER — SODIUM CHLORIDE 0.9 % IJ SOLN
OROMUCOSAL | Status: DC | PRN
  Administered 2015-07-08: 4 mL via TOPICAL

## 2015-07-08 MED FILL — Potassium Chloride Inj 2 mEq/ML: INTRAVENOUS | Qty: 40 | Status: AC

## 2015-07-08 MED FILL — Heparin Sodium (Porcine) Inj 1000 Unit/ML: INTRAMUSCULAR | Qty: 30 | Status: AC

## 2015-07-08 MED FILL — Magnesium Sulfate Inj 50%: INTRAMUSCULAR | Qty: 2 | Status: AC

## 2015-07-08 SURGICAL SUPPLY — 124 items
ADAPTER CARDIO PERF ANTE/RETRO (ADAPTER) ×4 IMPLANT
ADAPTER DLP PERFUSION .25INX2I (MISCELLANEOUS) ×4 IMPLANT
BAG DECANTER FOR FLEXI CONT (MISCELLANEOUS) ×8 IMPLANT
BLADE SURG 11 STRL SS (BLADE) ×4 IMPLANT
BLADE SURG 15 STRL LF DISP TIS (BLADE) ×3 IMPLANT
BLADE SURG 15 STRL SS (BLADE) ×1
CANISTER SUCTION 2500CC (MISCELLANEOUS) ×8 IMPLANT
CANNULA FEM VENOUS REMOTE 22FR (CANNULA) ×8 IMPLANT
CANNULA FEMORAL ART 14 SM (MISCELLANEOUS) ×4 IMPLANT
CANNULA GUNDRY RCSP 15FR (MISCELLANEOUS) ×4 IMPLANT
CANNULA OPTISITE PERFUSION 16F (CANNULA) ×4 IMPLANT
CANNULA OPTISITE PERFUSION 18F (CANNULA) ×4 IMPLANT
CANNULA SUMP PERICARDIAL (CANNULA) ×8 IMPLANT
CATH CPB KIT OWEN (MISCELLANEOUS) ×4 IMPLANT
CATH KIT ON Q 5IN SLV (PAIN MANAGEMENT) IMPLANT
CELLS DAT CNTRL 66122 CELL SVR (MISCELLANEOUS) ×3 IMPLANT
CONN 3/8X1/2 ST GISH (MISCELLANEOUS) ×8 IMPLANT
CONN ST 1/4X3/8  BEN (MISCELLANEOUS) ×2
CONN ST 1/4X3/8 BEN (MISCELLANEOUS) ×6 IMPLANT
CONNECTOR 1/2X3/8X1/2 3 WAY (MISCELLANEOUS) ×1
CONNECTOR 1/2X3/8X1/2 3WAY (MISCELLANEOUS) ×3 IMPLANT
CONT SPEC STER OR (MISCELLANEOUS) IMPLANT
CONT SPECI 4OZ STER CLIK (MISCELLANEOUS) ×8 IMPLANT
COVER BACK TABLE 24X17X13 BIG (DRAPES) ×4 IMPLANT
COVER DRAPE ULTRASOUND 610 023 (DRAPES) ×4 IMPLANT
COVER MAYO STAND STRL (DRAPES) ×4 IMPLANT
COVER PROBE W GEL 5X96 (DRAPES) ×8 IMPLANT
CRADLE DONUT ADULT HEAD (MISCELLANEOUS) ×4 IMPLANT
DERMABOND ADVANCED (GAUZE/BANDAGES/DRESSINGS) ×2
DERMABOND ADVANCED .7 DNX12 (GAUZE/BANDAGES/DRESSINGS) ×6 IMPLANT
DEVICE PMI PUNCTURE CLOSURE (MISCELLANEOUS) ×4 IMPLANT
DEVICE SUT CK QUICK LOAD MINI (Prosthesis & Implant Heart) ×4 IMPLANT
DEVICE TROCAR PUNCTURE CLOSURE (ENDOMECHANICALS) ×4 IMPLANT
DRAIN CHANNEL 28F RND 3/8 FF (WOUND CARE) ×8 IMPLANT
DRAPE BILATERAL SPLIT (DRAPES) ×4 IMPLANT
DRAPE C-ARM 42X72 X-RAY (DRAPES) ×4 IMPLANT
DRAPE CARDIOVASCULAR INCISE (DRAPES) ×1
DRAPE CV SPLIT W-CLR ANES SCRN (DRAPES) ×4 IMPLANT
DRAPE INCISE IOBAN 66X45 STRL (DRAPES) ×20 IMPLANT
DRAPE SLUSH/WARMER DISC (DRAPES) ×4 IMPLANT
DRAPE SRG 135X102X78XABS (DRAPES) ×3 IMPLANT
DRSG COVADERM 4X8 (GAUZE/BANDAGES/DRESSINGS) ×4 IMPLANT
ELECT BLADE 6.5 EXT (BLADE) ×4 IMPLANT
ELECT REM PT RETURN 9FT ADLT (ELECTROSURGICAL) ×8
ELECTRODE REM PT RTRN 9FT ADLT (ELECTROSURGICAL) ×6 IMPLANT
FEMORAL VENOUS CANN RAP (CANNULA) IMPLANT
GLOVE BIO SURGEON STRL SZ 6 (GLOVE) ×28 IMPLANT
GLOVE BIO SURGEON STRL SZ 6.5 (GLOVE) ×28 IMPLANT
GLOVE BIOGEL PI IND STRL 6 (GLOVE) ×6 IMPLANT
GLOVE BIOGEL PI IND STRL 6.5 (GLOVE) ×12 IMPLANT
GLOVE BIOGEL PI IND STRL 7.0 (GLOVE) ×24 IMPLANT
GLOVE BIOGEL PI INDICATOR 6 (GLOVE) ×2
GLOVE BIOGEL PI INDICATOR 6.5 (GLOVE) ×4
GLOVE BIOGEL PI INDICATOR 7.0 (GLOVE) ×8
GLOVE ORTHO TXT STRL SZ7.5 (GLOVE) ×16 IMPLANT
GOWN STRL REUS W/ TWL LRG LVL3 (GOWN DISPOSABLE) ×18 IMPLANT
GOWN STRL REUS W/TWL LRG LVL3 (GOWN DISPOSABLE) ×6
INSERT FOGARTY XLG (MISCELLANEOUS) ×4 IMPLANT
IV NS 1000ML (IV SOLUTION) ×1
IV NS 1000ML BAXH (IV SOLUTION) ×3 IMPLANT
IV NS IRRIG 3000ML ARTHROMATIC (IV SOLUTION) ×4 IMPLANT
KIT BASIN OR (CUSTOM PROCEDURE TRAY) ×4 IMPLANT
KIT DILATOR VASC 18G NDL (KITS) ×8 IMPLANT
KIT DRAINAGE VACCUM ASSIST (KITS) ×8 IMPLANT
KIT ROOM TURNOVER OR (KITS) ×4 IMPLANT
KIT SUCTION CATH 14FR (SUCTIONS) ×4 IMPLANT
KIT SUT CK MINI COMBO 4X17 (Prosthesis & Implant Heart) ×8 IMPLANT
LEAD PACING MYOCARDI (MISCELLANEOUS) ×4 IMPLANT
NEEDLE AORTIC ROOT 14G 7F (CATHETERS) ×4 IMPLANT
NS IRRIG 1000ML POUR BTL (IV SOLUTION) ×24 IMPLANT
PACK OPEN HEART (CUSTOM PROCEDURE TRAY) ×4 IMPLANT
PAD ARMBOARD 7.5X6 YLW CONV (MISCELLANEOUS) ×24 IMPLANT
PAD ELECT DEFIB RADIOL ZOLL (MISCELLANEOUS) ×4 IMPLANT
PATCH CORMATRIX 4CMX7CM (Prosthesis & Implant Heart) ×4 IMPLANT
PENCIL BUTTON HOLSTER BLD 10FT (ELECTRODE) ×4 IMPLANT
RETRACTOR TRL SOFT TISSUE LG (INSTRUMENTS) IMPLANT
RETRACTOR TRM SOFT TISSUE 7.5 (INSTRUMENTS) IMPLANT
RING RECHORD MEMO 3D 26MM (Vascular Products) ×4 IMPLANT
RTRCTR WOUND ALEXIS 18CM MED (MISCELLANEOUS) ×4
SET CANNULATION TOURNIQUET (MISCELLANEOUS) ×4 IMPLANT
SET CARDIOPLEGIA MPS 5001102 (MISCELLANEOUS) ×8 IMPLANT
SET IRRIG TUBING LAPAROSCOPIC (IRRIGATION / IRRIGATOR) ×4 IMPLANT
SOLUTION ANTI FOG 6CC (MISCELLANEOUS) ×8 IMPLANT
SPONGE GAUZE 4X4 12PLY STER LF (GAUZE/BANDAGES/DRESSINGS) ×4 IMPLANT
SPONGE LAP 18X18 X RAY DECT (DISPOSABLE) ×4 IMPLANT
SUT BONE WAX W31G (SUTURE) ×4 IMPLANT
SUT E-PACK MINIMALLY INVASIVE (SUTURE) ×4 IMPLANT
SUT ETHIBOND (SUTURE) ×8 IMPLANT
SUT ETHIBOND 2 0 SH (SUTURE) ×2
SUT ETHIBOND 2 0 SH 36X2 (SUTURE) ×6 IMPLANT
SUT ETHIBOND 2-0 RB-1 WHT (SUTURE) ×8 IMPLANT
SUT ETHIBOND X763 2 0 SH 1 (SUTURE) ×12 IMPLANT
SUT GORETEX CV 4 TH 22 36 (SUTURE) ×4 IMPLANT
SUT GORETEX CV-5THC-13 36IN (SUTURE) ×8 IMPLANT
SUT GORETEX CV4 TH-18 (SUTURE) ×16 IMPLANT
SUT PROLENE 3 0 SH DA (SUTURE) ×4 IMPLANT
SUT PROLENE 3 0 SH1 36 (SUTURE) ×16 IMPLANT
SUT PROLENE 4 0 SH DA (SUTURE) ×16 IMPLANT
SUT PROLENE 5 0 C 1 36 (SUTURE) ×4 IMPLANT
SUT PROLENE 6 0 C 1 30 (SUTURE) ×8 IMPLANT
SUT PTFE CHORD X SYSTEM (SUTURE) ×8 IMPLANT
SUT SILK  1 MH (SUTURE) ×7
SUT SILK 1 MH (SUTURE) ×21 IMPLANT
SUT SILK 2 0 SH CR/8 (SUTURE) IMPLANT
SUT SILK 3 0 SH CR/8 (SUTURE) IMPLANT
SUT VIC AB 2-0 CTX 27 (SUTURE) ×4 IMPLANT
SUT VIC AB 2-0 CTX 36 (SUTURE) IMPLANT
SUT VIC AB 3-0 SH 8-18 (SUTURE) ×12 IMPLANT
SUT VICRYL 2 TP 1 (SUTURE) IMPLANT
SYRINGE 10CC LL (SYRINGE) ×4 IMPLANT
SYSTEM SAHARA CHEST DRAIN ATS (WOUND CARE) ×4 IMPLANT
TAPE CLOTH SURG 4X10 WHT LF (GAUZE/BANDAGES/DRESSINGS) ×4 IMPLANT
TAPE PAPER 2X10 WHT MICROPORE (GAUZE/BANDAGES/DRESSINGS) ×4 IMPLANT
TOWEL OR 17X24 6PK STRL BLUE (TOWEL DISPOSABLE) IMPLANT
TOWEL OR 17X26 10 PK STRL BLUE (TOWEL DISPOSABLE) ×12 IMPLANT
TRAY FOLEY IC TEMP SENS 16FR (CATHETERS) ×4 IMPLANT
TROCAR XCEL BLADELESS 5X75MML (TROCAR) ×4 IMPLANT
TROCAR XCEL NON-BLD 11X100MML (ENDOMECHANICALS) ×8 IMPLANT
TUBE CONNECTING 12X1/4 (SUCTIONS) ×4 IMPLANT
TUBE SUCT INTRACARD DLP 20F (MISCELLANEOUS) ×4 IMPLANT
TUNNELER SHEATH ON-Q 11GX8 DSP (PAIN MANAGEMENT) IMPLANT
UNDERPAD 30X30 INCONTINENT (UNDERPADS AND DIAPERS) ×4 IMPLANT
WATER STERILE IRR 1000ML POUR (IV SOLUTION) ×8 IMPLANT
WIRE BENTSON .035X145CM (WIRE) ×8 IMPLANT

## 2015-07-08 NOTE — Progress Notes (Signed)
Late entry note- Upon entering room to s/u Nitric at 1522, noted pt on vent vt 400, rate 35, 20 peep, 100% fio2.  Pt placed on vent by Dr Elsworth Soho.  Nitric s/u at 20 ppm per MD order (Dr Elsworth Soho).  See OR notes and Dr Elsworth Soho note re: OET changed by anesthesia at 1531, pt s/p code at 1539, [place] at 1545.  Pt required ambu bagging on 100% w/ peep valve set at 20. Pt required ambu bagging intermittently d/t low sat.  Pt also w/ copious amount of frothy secretions from lungs. At 1735 pt back on vent and ECMO started approx 1737.  Post ECMO fio2 and peep weaned per directive of Dr Roxy Manns.  Waiting on [facility] transport to p/u pt.  Ambu bag at bedside, and MD & CRNA at bedside in [place].

## 2015-07-08 NOTE — OR Nursing (Signed)
2045 Team from [place] arrived to put pt on ecmo

## 2015-07-08 NOTE — Anesthesia Preprocedure Evaluation (Signed)
Anesthesia Evaluation  Patient identified by MRN, date of birth, ID band Patient awake    Reviewed: Allergy & Precautions, NPO status , Patient's Chart, lab work & pertinent test results, reviewed documented beta blocker date and time   Airway Mallampati: II  TM Distance: >3 FB Neck ROM: Full    Dental  (+) Dental Advisory Given   Pulmonary shortness of breath,  breath sounds clear to auscultation        Cardiovascular hypertension, Pt. on medications and Pt. on home beta blockers +CHF + Valvular Problems/Murmurs MR Rhythm:Regular Rate:Normal     Neuro/Psych CVA    GI/Hepatic negative GI ROS, Neg liver ROS,   Endo/Other  diabetes, Type 2Morbid obesity  Renal/GU CRFRenal disease     Musculoskeletal   Abdominal   Peds  Hematology  (+) anemia ,   Anesthesia Other Findings   Reproductive/Obstetrics                             Lab Results  Component Value Date   WBC 5.0 07/08/2015   HGB 8.2* 07/08/2015   HGB 9.5* 07/08/2015   HCT 25.7* 07/08/2015   HCT 28.0* 07/08/2015   MCV 82.1 07/08/2015   PLT 317 07/08/2015   Lab Results  Component Value Date   CREATININE 2.19* 07/08/2015   CREATININE 2.10* 07/08/2015   BUN 27* 07/08/2015   BUN 29* 07/08/2015   NA 138 07/08/2015   NA 139 07/08/2015   K 3.3* 07/08/2015   K 3.2* 07/08/2015   CL 99* 07/08/2015   CL 98* 07/08/2015   CO2 28 07/08/2015    Anesthesia Physical Anesthesia Plan  ASA: IV  Anesthesia Plan: General   Post-op Pain Management:    Induction: Intravenous  Airway Management Planned: Double Lumen EBT and Oral ETT  Additional Equipment: Arterial line, PA Cath, 3D TEE, CVP and Ultrasound Guidance Line Placement  Intra-op Plan:   Post-operative Plan: Post-operative intubation/ventilation  Informed Consent: I have reviewed the patients History and Physical, chart, labs and discussed the procedure including the risks,  benefits and alternatives for the proposed anesthesia with the patient or authorized representative who has indicated his/her understanding and acceptance.   Dental advisory given  Plan Discussed with: CRNA  Anesthesia Plan Comments:         Anesthesia Quick Evaluation

## 2015-07-08 NOTE — Anesthesia Procedure Notes (Signed)
Anesthesia Procedure Note Central line insertion note. Skin prepped and draped in sterile fashion. Patent vessel identified on u/s using linear probe. Needle advanced under live u/s guidance with aspiration of blood upon entry into vessel. Catheter passed easily over finder needle. Wire passed easily through catheter and location confirmed with u/s. Image saved in chart. Introducer catheter advanced over wire, with aspiration of blood through all ports for confirmation. Line sutured and dressing applied. Pt tolerated well with no immediate complications.  Deatra Canter, MD

## 2015-07-08 NOTE — Transfer of Care (Signed)
Immediate Anesthesia Transfer of Care Note  Patient: Jody Nguyen  Procedure(s) Performed: Procedure(s): MINIMALLY INVASIVE MITRAL VALVE REPAIR with a 72 Sorin Memo 3D Rechord (Right) TRANSESOPHAGEAL ECHOCARDIOGRAM (TEE) (N/A) CANNULATION FOR ECMO (N/A)  Patient Location: PACU and Pt. transported to [facility] with ECMO team.  Anesthesia Type:General  Level of Consciousness: Patient remains intubated per anesthesia plan  Airway & Oxygen Therapy: Patient remains intubated per anesthesia plan  Post-op Assessment: ECMO  Post vital signs: unstable  Last Vitals:  Filed Vitals:   07/08/15 0546  BP: 122/65  Pulse: 75  Temp: 36.8 C  Resp: 20    Complications: respiratory complications

## 2015-07-08 NOTE — Progress Notes (Signed)
59 year old with severe mitral regurgitation, underwent minimally invasive mitral valve repair. I was called emergently to the operating room to assist with management of hypoxia. She seemed to be in severe pulmonary edema, with frank fluid pouring out of her double lumen tube. She was suctioned out and changed from anesthesia vent to PRVC ventilation with PEEP of 20, but remained difficult to oxygenate. Double lumen tube was changed by anesthesia to single-lumen ET tube, initial chest x-ray showed whiteout of both lungs-ET tube was changed again and position confirmed by glyde scope  repeat chest x-ray showed ET tube in good position with white out of right lung and improved aeration in left. -Serial ABGs were obtained, -She lost her pulse and required CPR/ ACLS for 7 minutes, epinephrine drip was started She continued to desaturate requiring bagging with a Peep valve- sats improved to about 79% at best. She was turned to left lateral decubitus to get her good lung down, this provided marginal improvement. Ventilation improved with pH in the 7.25 range but she remained difficult to oxygenate. Dr. Roxy Manns spoke to family and contacted [facility] for ECMO. Further care per anesthesia & TCTS  Cc time x 60 mins  Kara Mead MD. FCCP. [facility] Pager [phone] If no response call [phone]

## 2015-07-08 NOTE — Progress Notes (Signed)
  Echocardiogram Echocardiogram Transesophageal has been performed.  Jody Nguyen 07/08/2015, 9:38 AM

## 2015-07-08 NOTE — Interval H&P Note (Signed)
History and Physical Interval Note:  07/08/2015 6:46 AM  Jody Nguyen  has presented today for surgery, with the diagnosis of MR  The various methods of treatment have been discussed with the patient and family. After consideration of risks, benefits and other options for treatment, the patient has consented to  Procedure(s): MINIMALLY INVASIVE MITRAL VALVE REPAIR OR REPLACEMENT (Right) TRANSESOPHAGEAL ECHOCARDIOGRAM (TEE) (N/A) as a surgical intervention .  The patient's history has been reviewed, patient examined, no change in status, stable for surgery.  I have reviewed the patient's chart and labs.  Questions were answered to the patient's satisfaction.     Rexene Alberts

## 2015-07-08 NOTE — OR Nursing (Signed)
15:50 - call to SICU nurse to update unit

## 2015-07-08 NOTE — Op Note (Signed)
CARDIOTHORACIC SURGERY OPERATIVE NOTE  Date of Procedure:  07/08/2015  Preoperative Diagnosis:   Severe Mitral Regurgitation  Postoperative Diagnosis:   Severe Mitral Regurgitation  Acute Hypoxemic Respiratory Failure  Procedure:    Minimally-Invasive Mitral Valve Repair  Complex valvuloplasty including artificial Gore-tex neochord placement x10  Sorin Memo 3D Rechord Ring Annuloplasty (size 52mm, catalog # [id number], serial # [id number])   Cannulation for Veno-venous Extracorporeal Membrane Oxygenator (ECMO) Support    Surgeon: 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 to the anterior leaflet (A2)  Primarily type II dysfunction with some type I dysfunction due to annular dilatation and severe (4+) mitral regurgitation  Normal LV systolic function  Moderate LV hypertrophy  Severe pulmonary hypertension  No residual mitral regurgitation after successful valve repair  Severe acute hypoxic respiratory failure after separation from CPB   Severe acute lung injury involving right lung related to barotrauma from right mainstem intubation and pulmonary edema  Brief episode cardiac arrest due to profound hypoxemia after attempted reintubation                  BRIEF CLINICAL NOTE AND INDICATIONS FOR SURGERY  Patient is a 59 year old African-American female with history of long-standing hypertension, chronic diastolic congestive heart failure, stage IV chronic kidney disease, type 2 diabetes mellitus with complication, and hyperlipidemia who has been referred for surgical consultation to discuss treatment options for management of severe symptomatic mitral regurgitation. The patient has a long history of hypertension with hypertensive cardiomyopathy, diastolic dysfunction, and chronic diastolic  congestive heart failure. She has been followed for several years by Dr. Stanford Breed. Echocardiogram performed in February 2014 revealed mild left ventricular enlargement, moderate left ventricular hypertrophy, and ejection fraction estimated 50-55%. There was moderate left atrial enlargement, mild mitral regurgitation, and a small pericardial effusion. The patient admits to a long history of relatively mild symptoms of exertional shortness of breath that have gradually progressed over several years. She was hospitalized in December 2015 with an acute exacerbation of chronic diastolic congestive heart failure and resting shortness of breath. Echocardiogram performed January 2016 revealed normal left ventricular systolic function, grade 2 diastolic dysfunction, mildly reduced RV function, and a moderate-sized pericardial effusion. The patient's pericardial effusion was felt possibly related to minoxidil therapy, and this was stopped. Over the past 6 months the patient's symptoms of congestive heart failure have progressed with more limiting symptoms of severe exertional shortness of breath. Follow-up echocardiogram performed 05/06/2015 revealed normal left ventricular function with very small residual pericardial effusion. Shortly after that the patient was hospitalized again with an acute exacerbation of chronic diastolic congestive heart failure. Repeat transthoracic echocardiogram performed 05/28/2015 revealed findings consistent with at least moderate and possibly severe mitral regurgitation. The patient subsequently underwent transesophageal echocardiogram 06/08/2015 confirming the presence of severe mitral regurgitation with mitral valve prolapse and flail segment involving the anterior leaflet of the mitral valve. The patient underwent left and right heart catheterization on 06/18/2015 revealing the presence of mild nonobstructive coronary artery disease. The patient had severe pulmonary hypertension and  prominent V waves on pulmonary capillary wedge tracing consistent with severe mitral regurgitation. The patient was again admitted to hospital for intravenous diuresis following her cath. She was subsequently referred for elective cardiothoracic surgical consultation.  The patient has been seen in consultation and counseled at length regarding the indications, risks and potential benefits of surgery.  All  questions have been answered, and the patient provides full informed consent for the operation as described.    DETAILS OF THE OPERATIVE PROCEDURE  Preparation:  The patient is brought to the operating room on the above mentioned date and central monitoring was established by the anesthesia team including placement of Swan-Ganz catheter through the left internal jugular vein.  There was severe pulmonary hypertension.  A radial arterial line is placed. The patient is placed in the supine position on the operating table.  Intravenous antibiotics are administered. General endotracheal anesthesia is induced uneventfully. The patient is initially intubated using a dual lumen endotracheal tube.  A Foley catheter is placed.  Baseline transesophageal echocardiogram was performed.  Findings were notable for normal LV systolic function and moderate LVH and diastolic dysfunction.  There was an obvious flail segment of the anterior leaflet of the mitral valve with multiple ruptured primary chordae tendinae and severe mitral regurgitation.  The mitral annulus was dilated.  There was trivial tricuspid regurgitation and the tricuspid annulus measured 3.6 cm in diameter.  A soft roll is placed behind the patient's left scapula and the neck gently extended and turned to the left.   The patient's right neck, chest, abdomen, both groins, and both lower extremities are prepared and draped in a sterile manner. A time out procedure is performed.  Surgical Approach:  A right miniature anterolateral thoracotomy incision  is performed. The incision is placed just lateral to and superior to the right nipple. The pectoralis major muscle is retracted medially and completely preserved. Single lung ventilation is begun.  The right pleural space is entered through the 3rd intercostal space. A soft tissue retractor is placed.  Two 11 mm ports are placed through separate stab incisions inferiorly. The right pleural space is insufflated continuously with carbon dioxide gas through the posterior port during the remainder of the operation.  The patient does not tolerate single lung ventilation for more than 1-2 minutes before she begins to desaturate.   Extracorporeal Cardiopulmonary Bypass and Myocardial Protection:  A small incision is made in the right inguinal crease and the anterior surface of the right common femoral artery and right common femoral vein are identified.  The patient is placed in Trendelenburg position. The right internal jugular vein is cannulated with Seldinger technique and a guidewire advanced into the right atrium. The patient is heparinized systemically. The right internal jugular vein is cannulated with a 14 [place] pediatric femoral venous cannula. Pursestring sutures are placed on the anterior surface of the right common femoral vein and right common femoral artery. The right common femoral vein is cannulated with the Seldinger technique and a guidewire is advanced under transesophageal echocardiogram guidance through the right atrium. The femoral vein is cannulated with a long 22 French femoral venous cannula. The right common femoral artery is cannulated with Seldinger technique and a flexible guidewire is advanced until it can be appreciated intraluminally in the descending thoracic aorta on transesophageal echocardiogram. The femoral artery is cannulated with an 18 French femoral arterial cannula.  Adequate heparinization is verified.     The entire pre-bypass portion of the operation was notable for  stable hemodynamics.  Cardiopulmonary bypass was begun.  Vacuum assist venous drainage is utilized. A pledgeted sutures placed through the dome of the right hemidiaphragm and retracted inferiorly to facilitate exposure.  A longitudinal incision is made in the pericardium 3 cm anterior to the phrenic nerve and silk traction sutures are placed on either side of the incision for  exposure.  The incision in the pericardium is extended in both directions. Venous drainage and exposure are notably excellent. A retrograde cardioplegia cannula is placed through the right atrium into the coronary sinus using transesophageal echocardiogram guidance.  An antegrade cardioplegia cannula is placed in the ascending aorta.    The patient is cooled to 28C systemic temperature.  The aortic cross clamp is applied and cold blood cardioplegia is delivered initially in an antegrade fashion through the aortic root.  Supplemental cardioplegia is given retrograde through the coronary sinus catheter. The initial cardioplegic arrest is rapid with early diastolic arrest.  Repeat doses of cardioplegia are administered intermittently every 20 to 30 minutes throughout the entire cross clamp portion of the operation through the aortic root and through the coronary sinus catheter in order to maintain completely flat electrocardiogram.  Myocardial protection was felt to be excellent.   Mitral Valve Repair:  A left atriotomy incision was performed through the interatrial groove and extended partially across the back wall of the left atrium after opening the oblique sinus inferiorly.  The mitral valve is exposed using a self-retaining retractor.  The mitral valve was inspected and notable for fibroelastic deficiency type degenerative disease with two ruptured primary chordae tendinae from the A2 segment of the anterior leaflet.  Both of the ruptured chordae were noted to have originated from the anterior papillary muscle.  The entire posterior  leaflet was normal.  The annulus was dilated and there were jet lesions along the posterior surface of the left atrium consistent with long-standing mitral regurgitation.  Interrupted 2-0 Ethibond horizontal mattress sutures are placed circumferentially around the entire mitral valve annulus. The sutures will ultimately be utilized for ring annuloplasty, and at this juncture there are utilized to suspend the valve symmetrically.  A pledgeted CV-4 Chord-X multi-strand Gore-tex suture was placed through the head of the anterior papillary muscle and tied in a horizontal mattress fashion.  A second CV-4 pledgeted CV-4 Chord-X multi-strand Gore-tex suture was placed through the head of the posterior papillary muscle and tied in a horizontal mattress fashion.  One pair of Gore-tex strands from each papillary muscle were woven into the anterior leaflet beginning at the free margin where they were placed from the ventricular surface to the atrial surface, and then woven in a diamond shaped fashion towards the anterior mitral annulus.  The Goretex sutures were then tied while the LV was distended with saline so as to adjust the length of the neocords to the appropriate length.  The valve was tested with saline and appeared competent even without ring annuloplasty complete. The valve was sized to a 26 mm annuloplasty ring, based upon the transverse distance between the left and right commissures and the height of the anterior leaflet, corresponding to a size equivalent to the overall surface area of the anterior leaflet. A Sorin Memo 3D Rechord annuloplasty ring (size 36mm, catalog [id number], serial [id number]) was secured in place uneventfully.   The remaining 2 pairs of Gore-tex sutures from each of the papillary muscle were each individually implanted into the free margin of the anterior leaflet and tied using the yellow loops of the Rechord annuloplasty ring to fix the length of each suture to the posterior mitral  annulus.  The blue and yellow strings from the annuloplasty ring were subsequently removed.  All ring sutures were secured using a Cor-knot device.  The valve was tested with saline and appeared competent. One of the pair of Gore-tex sutures from the posterior papillary muscle  appeared too short.  It was removed, leaving a total of 10 artificial Gore-tex neochords (5 pairs), 6 attached to the anterior papillary muscle on the anterior side of A2 and 4 attached to the posterior papillary muscle on the posterior side of A2.  The valve is again tested with saline and appears to be perfectly competent. There is no residual leak. There was a broad, symmetrical line of coaptation of the anterior and posterior leaflet which was confirmed using the blue ink test.  Rewarming is begun.   Procedure Completion:  The atriotomy was closed using a 2-layer closure of running 3-0 Prolene suture after placing a sump drain across the mitral valve to serve as a left ventricular vent.  One final dose of warm retrograde "hot shot" cardioplegia was administered retrograde through the coronary sinus catheter while all air was evacuated through the aortic root.  The aortic cross clamp was removed after a total cross clamp time of 144 minutes.  Epicardial pacing wires are fixed to the inferior wall of the right ventricule and to the right atrial appendage. The patient is rewarmed to 37C temperature. The left ventricular vent is removed.  The patient is ventilated and flow volumes turndown while the mitral valve repair is inspected using transesophageal echocardiogram. The valve repair appears intact with no residual leak. The antegrade cardioplegia cannula is now removed. The patient is weaned and disconnected from cardiopulmonary bypass.  The patient's rhythm at separation from bypass was sinus.  The patient was weaned from bypass on low dose milrinone and dopamine infusions. Total cardiopulmonary bypass time for the operation was  214 minutes.  Followup transesophageal echocardiogram performed after separation from bypass revealed a well-seated annuloplasty ring in the mitral position with a normal functioning mitral valve. There was no residual leak.  Left ventricular function was unchanged from preoperatively.  The mean gradient across the mitral valve was estimated to be 5 mmHg.  The femoral arterial and venous cannulae were removed uneventfully. There was a palpable pulse in the distal right common femoral artery after removal of the cannula. Protamine was administered to reverse the anticoagulation. The right internal jugular cannula was removed and manual pressure held on the neck for 15 minutes.  The atriotomy closure was inspected for hemostasis. The pericardial sac was drained using a 28 French Bard drain placed through the anterior port incision.  The pericardium was closed using a patch of core matrix bovine submucosal tissue patch. The right pleural space is irrigated with saline solution and inspected for hemostasis. The right pleural space was drained using a 28 French Bard drain placed through the posterior port incision. The miniature thoracotomy incision was closed in multiple layers in routine fashion. The right groin incision was inspected for hemostasis and closed in multiple layers in routine fashion.  The patient would not tolerate single lung ventilation and the patient's oxygenation on two lung ventilation remained tenuous.  During closure the patient's oxygenation deteriorated further, requiring manual recruitment maneuvers.  There were copious serous airway secretions appreciated.  Intraoperative consultation from Dr. Elsworth Soho from the Pulmonary/CCM team was obtained and the patient was ventilated using PRVC mode ventilation with escalating amounts of PEEP.    A chest x-ray was performed that demonstrated that the dual lumen endotracheal tube had been inadvertently placed in the right mainstem bronchus for the  entire case.  There was severe opacity of the right lung consistent with severe acute lung injury, presumably from barotrauma and acute pulmonary edema.  The left lung  appeared remarkably clear.  The dual lumen endotracheal tube was replaced by Dr. Ola Spurr.  The patient developed progressive hypoxemia despite aggressive maneuvers with the ventilator.  A repeat chest x-ray suggested that the endotracheal tube was in the esophagus.  The patient suffered an acute cardiopulmonary arrest requiring several minutes of CPR and DC cardioversion on one occasion.  The endotracheal tube was replaced in the trachea and a third chest x-ray confirmed appropriate position.  Despite markedly improved ventilation once the ET tube was replaced, the patient continued to demonstrate severe acute hypoxic respiratory failure with inability to maintain O2 saturations above 70%.  The patient was positioned with left side down and aggressive maneuvers performed with the ventilator to improve oxygenation, all without significant improvement.  The patient's family was appraised of the developments and a decision was made to initiate veno-venous ECMO.  [facility] was contacted and the patient was felt to be a suitable candidate for ECMO support by Dr. Norm Parcel.   Cannulation for ECMO Support:  The patient's neck, chest, abdomen and both groins were prepped and draped in a sterile manner.  The right internal jugular vein was cannulated with the Seldinger technique and a guidewire advanced into the right atrium using TEE guidance.  The right femoral vein was cannulated using the Seldinger technique and a guidewire advanced through the right atrium into the superior vena cava using TEE guidance.  The patient was heparinized systemically.   The right internal jugular vein was cannulated using a 16 Fr femoral arterial cannula.  The right femoral vein was cannulated using a 22 Fr long femoral venous cannula using TEE guidance.  Veno-venous  cardiopulmonary bypass was commenced.  The patient's oxygenation improved immediately.  FiO2 and PEEP were weaned.  Pressors added during the patient's arrest were weaned.  Repeat TEE was performed by Dr. Ola Spurr. The patient's mitral valve repair remained intact with no mitral regurgitation.  LV function appeared mildly reduced.  There was septal flattening suggestive of worsened RV dysfunction but the RV freewall appeared to move well.  No other significant abnormalities were noted.   Disposition:  The patient was supported using a conventional heart-lung machine until the [facility] team arrived.  The patient remained remarkably stable during this period of time.  The patient was subsequently transferred to an ECMO circuit and transported to [facility] for further management.  All sponge instrument and needle counts are verified correct at completion of the operation.    [name] MD 07/08/2015  9:09 PM

## 2015-07-08 NOTE — Progress Notes (Signed)
  Echocardiogram Echocardiogram Transesophageal has been performed.  Jody Nguyen 07/08/2015, 6:03 PM

## 2015-07-08 NOTE — Discharge Summary (Signed)
Physician Discharge Summary  Patient ID: Jody Nguyen MRN: [id number] DOB/AGE: February 22, 1956 59 y.o.  Admit date: 07/08/2015 Discharge date: 07/08/2015  Admission Diagnoses: Principal Problem:   Severe mitral regurgitation Active Problems:   Chronic diastolic heart failure   Hypertension   Anemia in chronic kidney disease   Acute on chronic diastolic CHF (congestive heart failure)   T1_IDDM  w/CKD4 (GFR 35 ml/min)   Morbid obesity (BMI 36.05)    IDDM (insulin dependent diabetes mellitus)   CKD (chronic kidney disease) stage 4, GFR 15-29 ml/min   Pulmonary hypertension  Discharge Diagnoses:  Principal Problem:   Severe mitral regurgitation Active Problems:   Chronic diastolic heart failure   Hypertension   Anemia in chronic kidney disease   Acute on chronic diastolic CHF (congestive heart failure)   T1_IDDM  w/CKD4 (GFR 35 ml/min)   Morbid obesity (BMI 36.05)    IDDM (insulin dependent diabetes mellitus)   CKD (chronic kidney disease) stage 4, GFR 15-29 ml/min   Pulmonary hypertension   Respiratory failure, acute hypoxic, post-operative   S/P minimally invasive mitral valve repair   Discharged Condition: critical  Hospital Course: Patient was admitted on the day of surgery for elective minimally-invasive mitral valve repair.  The patient underwent said procedure but developed acute hypoxic respiratory failure refractory to conventional ventilatory support.  [facility] was contacted and the patient felt to be an appropriate candidate for ECMO support.  Cannulation for veno-venous ECMO was performed and veno-venous ECMO initiated using a conventional heart-lung machine.  The patient stabilized and was supported using ECMO until a transport team from [facility] arrived.  Details are elaborated in the patient's operative note.  Consults: pulmonary/intensive care  Significant Diagnostic Studies: TEE  Treatments:  Minimally invasive mitral valve repair Cannulation for veno-venous ECMO  support  Disposition: Transfer to [facility]     Signed: Rexene Alberts 07/08/2015  9:09 PM

## 2015-07-08 NOTE — Brief Op Note (Addendum)
07/08/2015      [address]       [place] [address]             [phone]     07/08/2015  1:08 PM  PATIENT:  Jody Nguyen  59 y.o. 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 and Neochrods. Number of Neochords Inserted: 10  Implant: Annuloplasty Device: Implant model number [id number], Size 26, Unique Device Identifier [id number].      Mitral Valve Etiology  MV Insufficiency: Severe  MV Disease: YES  MV Stenosis: No mitral valve stenosis.  MV Disease Functional Class: TYPE 1 AND 2  Etiology (Choose at least one and up to five): Degenerative. and Other. FIBROELASTIC DZ  MV Lesions (Choose at least one): Leaflet prolapse, anterior. , Elongated/ruptured chords(s).  and Annular dilation.  COMPLICATIONS: None  BASELINE WEIGHT: 81 kg  PATIENT DISPOSITION:   TO [facility] IN STABLE BUT CRITICAL CONDITION  Rexene Alberts 07/08/2015

## 2015-07-09 ENCOUNTER — Encounter (HOSPITAL_COMMUNITY): Payer: Self-pay | Admitting: Thoracic Surgery (Cardiothoracic Vascular Surgery)

## 2015-07-09 LAB — POCT I-STAT 3, ART BLOOD GAS (G3+)
Acid-Base Excess: 1 mmol/L (ref 0.0–2.0)
Acid-Base Excess: 1 mmol/L (ref 0.0–2.0)
Acid-Base Excess: 1 mmol/L (ref 0.0–2.0)
Acid-base deficit: 4 mmol/L — ABNORMAL HIGH (ref 0.0–2.0)
Acid-base deficit: 2 mmol/L (ref 0.0–2.0)
Bicarbonate: 29 mEq/L — ABNORMAL HIGH (ref 20.0–24.0)
Bicarbonate: 30.2 mEq/L — ABNORMAL HIGH (ref 20.0–24.0)
Bicarbonate: 26 mEq/L — ABNORMAL HIGH (ref 20.0–24.0)
Bicarbonate: 24.8 mEq/L — ABNORMAL HIGH (ref 20.0–24.0)
Bicarbonate: 21.3 mEq/L (ref 20.0–24.0)
Bicarbonate: 23.7 mEq/L (ref 20.0–24.0)
Bicarbonate: 22.1 mEq/L (ref 20.0–24.0)
O2 Saturation: 72 %
O2 Saturation: 82 %
O2 Saturation: 79 %
O2 Saturation: 100 %
O2 Saturation: 100 %
O2 Saturation: 84 %
O2 Saturation: 98 %
Patient temperature: 35.2
TCO2: 31 mmol/L (ref 0–100)
TCO2: 32 mmol/L (ref 0–100)
TCO2: 27 mmol/L (ref 0–100)
TCO2: 26 mmol/L (ref 0–100)
TCO2: 22 mmol/L (ref 0–100)
TCO2: 25 mmol/L (ref 0–100)
TCO2: 23 mmol/L (ref 0–100)
pCO2 arterial: 66.4 mmHg (ref 35.0–45.0)
pCO2 arterial: 70.7 mmHg (ref 35.0–45.0)
pCO2 arterial: 38.9 mmHg (ref 35.0–45.0)
pCO2 arterial: 40 mmHg (ref 35.0–45.0)
pCO2 arterial: 33.4 mmHg — ABNORMAL LOW (ref 35.0–45.0)
pCO2 arterial: 38.1 mmHg (ref 35.0–45.0)
pCO2 arterial: 33.4 mmHg — ABNORMAL LOW (ref 35.0–45.0)
pH, Arterial: 7.238 — ABNORMAL LOW (ref 7.350–7.450)
pH, Arterial: 7.248 — ABNORMAL LOW (ref 7.350–7.450)
pH, Arterial: 7.426 (ref 7.350–7.450)
pH, Arterial: 7.4 (ref 7.350–7.450)
pH, Arterial: 7.355 (ref 7.350–7.450)
pH, Arterial: 7.458 — ABNORMAL HIGH (ref 7.350–7.450)
pH, Arterial: 7.429 (ref 7.350–7.450)
pO2, Arterial: 46 mmHg — ABNORMAL LOW (ref 80.0–100.0)
pO2, Arterial: 57 mmHg — ABNORMAL LOW (ref 80.0–100.0)
pO2, Arterial: 38 mmHg — CL (ref 80.0–100.0)
pO2, Arterial: 425 mmHg — ABNORMAL HIGH (ref 80.0–100.0)
pO2, Arterial: 346 mmHg — ABNORMAL HIGH (ref 80.0–100.0)
pO2, Arterial: 51 mmHg — ABNORMAL LOW (ref 80.0–100.0)
pO2, Arterial: 96 mmHg (ref 80.0–100.0)

## 2015-07-09 LAB — TYPE AND SCREEN
ABO/RH(D): O POS
Antibody Screen: NEGATIVE
Unit division: 0
Unit division: 0
Unit division: 0
Unit division: 0
Unit division: 0
Unit division: 0
Unit division: 0
Unit division: 0
Unit division: 0

## 2015-07-09 LAB — PREPARE FRESH FROZEN PLASMA: Unit division: 0

## 2015-07-09 LAB — POCT I-STAT 7, (LYTES, BLD GAS, ICA,H+H)
Acid-base deficit: 1 mmol/L (ref 0.0–2.0)
Acid-base deficit: 2 mmol/L (ref 0.0–2.0)
Acid-base deficit: 2 mmol/L (ref 0.0–2.0)
Bicarbonate: 22.1 mEq/L (ref 20.0–24.0)
Bicarbonate: 22.7 mEq/L (ref 20.0–24.0)
Bicarbonate: 21.7 mEq/L (ref 20.0–24.0)
Calcium, Ion: 0.95 mmol/L — ABNORMAL LOW (ref 1.13–1.30)
Calcium, Ion: 1.61 mmol/L — ABNORMAL HIGH (ref 1.13–1.30)
Calcium, Ion: 1.19 mmol/L (ref 1.13–1.30)
HCT: 23 % — ABNORMAL LOW (ref 36.0–46.0)
HCT: 24 % — ABNORMAL LOW (ref 36.0–46.0)
HCT: 29 % — ABNORMAL LOW (ref 36.0–46.0)
Hemoglobin: 7.8 g/dL — ABNORMAL LOW (ref 12.0–15.0)
Hemoglobin: 8.2 g/dL — ABNORMAL LOW (ref 12.0–15.0)
Hemoglobin: 9.9 g/dL — ABNORMAL LOW (ref 12.0–15.0)
O2 Saturation: 100 %
O2 Saturation: 98 %
O2 Saturation: 100 %
Patient temperature: 37
Patient temperature: 37
Patient temperature: 37
Potassium: 2.7 mmol/L — CL (ref 3.5–5.1)
Potassium: 2.8 mmol/L — ABNORMAL LOW (ref 3.5–5.1)
Potassium: 2.9 mmol/L — ABNORMAL LOW (ref 3.5–5.1)
Sodium: 145 mmol/L (ref 135–145)
Sodium: 146 mmol/L — ABNORMAL HIGH (ref 135–145)
Sodium: 145 mmol/L (ref 135–145)
TCO2: 23 mmol/L (ref 0–100)
TCO2: 24 mmol/L (ref 0–100)
TCO2: 23 mmol/L (ref 0–100)
pCO2 arterial: 30.6 mmHg — ABNORMAL LOW (ref 35.0–45.0)
pCO2 arterial: 32.6 mmHg — ABNORMAL LOW (ref 35.0–45.0)
pCO2 arterial: 33.9 mmHg — ABNORMAL LOW (ref 35.0–45.0)
pH, Arterial: 7.439 (ref 7.350–7.450)
pH, Arterial: 7.478 — ABNORMAL HIGH (ref 7.350–7.450)
pH, Arterial: 7.415 (ref 7.350–7.450)
pO2, Arterial: 163 mmHg — ABNORMAL HIGH (ref 80.0–100.0)
pO2, Arterial: 97 mmHg (ref 80.0–100.0)
pO2, Arterial: 167 mmHg — ABNORMAL HIGH (ref 80.0–100.0)

## 2015-07-09 LAB — POCT I-STAT, CHEM 8
BUN: 17 mg/dL (ref 6–20)
BUN: 21 mg/dL — ABNORMAL HIGH (ref 6–20)
BUN: 17 mg/dL (ref 6–20)
BUN: 18 mg/dL (ref 6–20)
Calcium, Ion: 0.97 mmol/L — ABNORMAL LOW (ref 1.13–1.30)
Calcium, Ion: 1.23 mmol/L (ref 1.13–1.30)
Calcium, Ion: 1 mmol/L — ABNORMAL LOW (ref 1.13–1.30)
Calcium, Ion: 1.04 mmol/L — ABNORMAL LOW (ref 1.13–1.30)
Chloride: 105 mmol/L (ref 101–111)
Chloride: 113 mmol/L — ABNORMAL HIGH (ref 101–111)
Chloride: 106 mmol/L (ref 101–111)
Chloride: 105 mmol/L (ref 101–111)
Creatinine, Ser: 1.2 mg/dL — ABNORMAL HIGH (ref 0.44–1.00)
Creatinine, Ser: 1.6 mg/dL — ABNORMAL HIGH (ref 0.44–1.00)
Creatinine, Ser: 1.4 mg/dL — ABNORMAL HIGH (ref 0.44–1.00)
Creatinine, Ser: 1.4 mg/dL — ABNORMAL HIGH (ref 0.44–1.00)
Glucose, Bld: 180 mg/dL — ABNORMAL HIGH (ref 65–99)
Glucose, Bld: 202 mg/dL — ABNORMAL HIGH (ref 65–99)
Glucose, Bld: 153 mg/dL — ABNORMAL HIGH (ref 65–99)
Glucose, Bld: 148 mg/dL — ABNORMAL HIGH (ref 65–99)
HCT: 25 % — ABNORMAL LOW (ref 36.0–46.0)
HCT: 36 % (ref 36.0–46.0)
HCT: 25 % — ABNORMAL LOW (ref 36.0–46.0)
HCT: 28 % — ABNORMAL LOW (ref 36.0–46.0)
Hemoglobin: 12.2 g/dL (ref 12.0–15.0)
Hemoglobin: 8.5 g/dL — ABNORMAL LOW (ref 12.0–15.0)
Hemoglobin: 8.5 g/dL — ABNORMAL LOW (ref 12.0–15.0)
Hemoglobin: 9.5 g/dL — ABNORMAL LOW (ref 12.0–15.0)
Potassium: 3.2 mmol/L — ABNORMAL LOW (ref 3.5–5.1)
Potassium: 3.7 mmol/L (ref 3.5–5.1)
Potassium: 3.1 mmol/L — ABNORMAL LOW (ref 3.5–5.1)
Potassium: 3 mmol/L — ABNORMAL LOW (ref 3.5–5.1)
Sodium: 140 mmol/L (ref 135–145)
Sodium: 145 mmol/L (ref 135–145)
Sodium: 145 mmol/L (ref 135–145)
Sodium: 145 mmol/L (ref 135–145)
TCO2: 23 mmol/L (ref 0–100)
TCO2: 26 mmol/L (ref 0–100)
TCO2: 26 mmol/L (ref 0–100)
TCO2: 25 mmol/L (ref 0–100)

## 2015-07-09 LAB — PREPARE PLATELET PHERESIS
Unit division: 0
Unit division: 0

## 2015-07-09 LAB — BLOOD PRODUCT ORDER (VERBAL) VERIFICATION

## 2015-07-09 LAB — POCT I-STAT GLUCOSE
Glucose, Bld: 263 mg/dL — ABNORMAL HIGH (ref 65–99)
Operator id: 284731

## 2015-07-09 NOTE — Transfer of Care (Signed)
Immediate Anesthesia Transfer of Care Note  Patient: Jody Nguyen  Procedure(s) Performed: Procedure(s): MINIMALLY INVASIVE MITRAL VALVE REPAIR with a 91 Sorin Memo 3D Rechord (Right) TRANSESOPHAGEAL ECHOCARDIOGRAM (TEE) (N/A) CANNULATION FOR ECMO (N/A)  Transfer to [facility]

## 2015-07-09 NOTE — Anesthesia Postprocedure Evaluation (Signed)
  Anesthesia Post-op Note Transfer to [facility] on ECMO

## 2015-07-09 NOTE — Progress Notes (Signed)
RT Note: Late entry. RT monitored patient on vent in OR 15. Changed HME at least 5 times do to copious secretions. Patient ventilator rate increased and decreased through and FIO2 adjusted per MDs  Verbal orders. ABGs drawn and recorded after each vent change.

## 2015-07-13 MED FILL — Dexmedetomidine HCl in NaCl 0.9% IV Soln 400 MCG/100ML: INTRAVENOUS | Qty: 100 | Status: AC

## 2015-07-27 NOTE — Progress Notes (Signed)
Cardiology Office Note   Date:  07/27/2015   ID:  Jody Nguyen, DOB 02/02/1956, MRN [id number]  PCP:  Alesia Richards, MD  Cardiologist:  Dr. [name]     Chief Complaint  Patient presents with  . Mitral Regurgitation    s/p MV Repair     History of Present Illness: Jody Nguyen is a 59 y.o. female with a hx of malignant HTN, diastolic heart failure, stroke, DM, CKD4 , Hyperlipidemia, Vitamin D deficiency and anemia. Myoview in Oct 2012 showed EF 49%, no ischemia, LVE. Renal dopplers Nov 2013 showed no RAS. She was admitted Dec 2015 with CHF after missing Lasix; cardiac MRI showed ejection fraction 61%, moderate left ventricular hypertrophy, large pericardial effusion and mildly reduced RV function. Last echocardiogram in January 2016 showed normal LV function, grade 2 diastolic dysfunction, mildly reduced RV function. Moderate pericardial effusion. Pericardial effusion felt possibly related to minoxidil which was stopped. Last seen by Dr. [name] in the office 04/28/15. Repeat echo 05/06/15 demonstrated EF 55-60%, mild MR, small effusion.   Admitted [id number] with a/c diastolic HF.  She was diuresed with IV Lasix.  DDimer was elevated and VQ scan was low probability for PE.  Repeat echocardiogram demonstrated EF 55-60%, moderate diastolic dysfunction, at least moderate mitral regurgitation (very eccentric, posteriorly directed). Mitral valve not fully visualized. Prolapse of A2 segment of anterior mitral valve leaflet possible. Small circumferential pericardial effusion without tamponade. Recommendation was to consider TEE to more closely assess MR.   TEE was done 06/08/15.  This demonstrated normal LVF, mod to severe LAE, LVH, mildly reduced RVF, small pericardial effusion, flail segment of anterior MV leaflet (A2) with severe eccentric MR, mild TR.  I spoke to Dr. [name] last week.  The patient needs to be set up with a R/L heart cath.  She will then need to  be set up to see Dr. Roxy Manns for consideration of MV repair vs replacement.    She returns for FU.  She is here alone today. She has been doing ok since her TEE. She denies any worsening DOE. She sleeps on 2 pillows chronically without change. No PND.  She did wake up once since DC with chest tightness.  She was able to quickly go back to sleep.  She denies syncope.  Weights have been stable. BP at home stable.  Highest BP at home 140/90.     Studies/Reports Reviewed Today:  LHC 06/18/15  Nonobstructive coronary artery disease.  Severe pulmonary artery hypertension. PA saturation 43%. Cardiac index 1.86. Prominent V waves indicative of mitral regurgitation.  Resting hypoxemia with oxygen saturation 86% on room air.  Systemic hypertension  Discussed the case with Dr. Stanford Breed. Will admit the patient. She will need to be diuresed as her renal function allows. Hopefully, this will help with her decreased oxygen saturations and pulmonary hypertension. Further consultation with Dr. Roxy Manns to be obtained for mitral valve repair.     Coronary Findings    Dominance: Right   Left Anterior Descending   . Mid LAD lesion, 30% stenosed.   . Second Diagonal Branch   The vessel is small in size.   . Third Diagonal Branch   The vessel is small in size.     Left Circumflex  The vessel is small .       Right Heart Pressures Hemodynamic findings consistent with severe pulmonary hypertension. Elevated LV EDP consistent with volume overload. Prominent V waves     TEE 06/08/15 -  Left ventricle: Hypertrophy was noted.  EF 55%to 60%. Wall motion was normal - Aortic valve: No evidence of vegetation. - Mitral valve: Moderate flail motion involving the middle segmentof the anterior leaflet due to rupture of one or more chords.There was severe regurgitation directed eccentrically andposteriorly. - Left atrium: The atrium was moderately to severely dilated. - Right ventricle: Systolic function  was mildly reduced. - Right atrium: No evidence of thrombus in the atrial cavity orappendage. - Atrial septum: No defect or patent foramen ovale was identified. - Tricuspid valve: No evidence of vegetation. - Pulmonic valve: No evidence of vegetation. - Pericardium, extracardiac: A small pericardial effusion wasidentified. Impressions:  - Normal LV function; LVH; moderate to severe LAE; mildly reduced RV function; small pericardial effusion; flail segment of anterior MV leaflet (A2) with severe eccentric MR; mild TR.  Echo 05/28/15 - Left ventricle:  Mild LVH.  EF 55% to 60%.Wall motion was normal.  Grade 2 diastolic dysfunction). - Aortic valve: There was no stenosis. - Mitral valve: Mildly calcified annulus. There was at leastmoderate mitral regurgitation. MR was very eccentric,posteriorly-directed. Possible that there is some prolapse of A2segment leading to the MR but difficult to tell mechanism. - Left atrium: The atrium was moderately dilated. - Right ventricle: The cavity size was normal. Systolic functionwas normal. - Atrial septum: The septum bowed from left to right, consistentwith increased left atrial pressure. - Tricuspid valve: Peak RV-RA gradient (S): 44 mm Hg. - Pulmonary arteries: PA peak pressure: 47 mm Hg (S). - Inferior vena cava: The vessel was normal in size. The respirophasic diameter changes were in the normal range (>= 50%),consistent with normal central venous pressure. - Pericardium, extracardiac: A small circumferential pericardialeffusion was identified, no tamponade. Impressions:- Normal LV size with mild LV hypertrophy. EF 55-60%. Moderatediastolic dysfunction. Very eccentric, posteriorly-directedmitral regurgitation, at least moderate. Not fully visualized. Iam not definitely sure of the mechanism but may be prolapse of A2segment of anterior MV leaflet. Normal RV size and systolicfunction. Mild pulmonary hypertension. Small  circumferentialpericardial effusion without tamponade. Would consider TEE tomore closely assess the MR.  Cardiac MRI 11/24/14 IMPRESSION: 1. Large, free-flowing pericardial effusion without evidence for tamponade. 2. Moderate concentric LVH without evidence for asymmetry or SAM to suggest hypertrophic cardiomyopathy. EF 61%, normal. 3. Mildly dilated RV with mildly decreased systolic function. 4. There was a subtle, patchy delayed enhancement pattern in the walls noted above that did not appear to be in a coronary disease pattern. It was not characteristic of amyloidosis, which tends to be more extensive and we did not have trouble nulling the myocardium as you often see with amyloidosis. Patchy delayed enhancement can be seen with severe hypertensive heart disease.  Myoview 08/2011 Low risk stress nuclear study. There is no evidence of ischemia. The LV is dilated but the LV function is normal. EF = 49%.  Past Medical History  Diagnosis Date  . HLD (hyperlipidemia)     takes Crestor daily  . Vitamin D deficiency   . CVA (cerebral infarction) 1997    no residual deficit  . Anemia   . CKD (chronic kidney disease) stage 4, GFR 15-29 ml/min   . Diverticulitis   . History of cardiovascular stress test     a. Myoview Oct 2012 showed EF 49%, no ischemia, LVE  . Pericardial effusion     chronic; felt to be poss related to minoxidil >> DC'd  . S/P cardiac catheterization     a. R/L HC 06/18/15:  mLAD 30%; severe pulmo HTN  with PA sat 43%, CI 1.86, prominent V waves indicative of MR; resting hypoxemia O2 sat 86% on RA  . Pulmonary hypertension 06/18/2015  . Severe mitral regurgitation   . Chronic diastolic heart failure   . Stroke 1997    no residual effect  . Heart murmur   . Shortness of breath dyspnea   . Constipation   . Hypertension     takes Hydralazine,Losartan,and Labetalol daily  . DM (diabetes mellitus)     takes Novolog and Metformin daily  . Chronic diastolic CHF  (congestive heart failure)     takes Furosemide daily  . Respiratory failure, acute hypoxic, post-operative 07/08/2015    Requiring ECMO support  . S/P minimally invasive mitral valve repair 07/08/2015    Complex valvuloplasty including artificial Gore-tex neochord placement x10 and 26 mm Sorin Memo 3D Rechord ring annuloplasty via right mini thoracotomy approach    Past Surgical History  Procedure Laterality Date  . Cesarean section      x 2  . Tee without cardioversion N/A 06/08/2015    Procedure: TRANSESOPHAGEAL ECHOCARDIOGRAM (TEE);  Surgeon: Lelon Perla, MD;  [facility] ENDOSCOPY;  Service: Cardiovascular;  Laterality: N/A;  . Cardiac catheterization    . Cardiac catheterization N/A 06/18/2015    Procedure: Right/Left Heart Cath and Coronary Angiography;  Surgeon: Jettie Booze, MD;  Location: [place] CV LAB;  Service: Cardiovascular;  Laterality: N/A;  . Colonoscopy    . Multiple extractions with alveoloplasty N/A 06/30/2015    Procedure: MULTIPLE EXTRACTIONS OF TOOTH #'S 4  AND 30 WITH ALVEOLOPLASTY AND GROSS DEBRIDEMENT  OF REMAINING TEETH;  Surgeon: Lenn Cal, DDS;  Location: [place];  Service: Oral Surgery;  Laterality: N/A;  . Mitral valve repair Right 07/08/2015    Procedure: MINIMALLY INVASIVE MITRAL VALVE REPAIR with a 26 Sorin Memo 3D Rechord;  Surgeon: Rexene Alberts, MD;  Location: [place];  Service: Open Heart Surgery;  Laterality: Right;  . Tee without cardioversion N/A 07/08/2015    Procedure: TRANSESOPHAGEAL ECHOCARDIOGRAM (TEE);  Surgeon: Rexene Alberts, MD;  Location: [place];  Service: Open Heart Surgery;  Laterality: N/A;  . Cannulation for cardiopulmonary bypass N/A 07/08/2015    Procedure: CANNULATION FOR ECMO;  Surgeon: Rexene Alberts, MD;  Location: [place];  Service: Open Heart Surgery;  Laterality: N/A;     Current Outpatient Prescriptions  Medication Sig Dispense Refill  . amiodarone (PACERONE) 200 MG tablet Take 1 tablet (200 mg total) by  mouth 2 (two) times daily with a meal. (Patient taking differently: Take 200 mg by mouth 2 (two) times daily with a meal. Starts this medication on 07/01/15) 60 tablet 0  . aspirin 81 MG tablet Take 81 mg by mouth daily.      . canagliflozin (INVOKANA) 100 MG TABS tablet Take 1 tablet (100 mg total) by mouth daily. 90 tablet 3  . Cholecalciferol (VITAMIN D-3) 5000 UNITS TABS Take 5,000 Units by mouth daily.     . cloNIDine (CATAPRES - DOSED IN MG/24 HR) 0.2 mg/24hr patch Place 1 patch (0.2 mg total) onto the skin every 7 (seven) days. 4 patch 2  . cyclobenzaprine (FLEXERIL) 10 MG tablet Take 1 tablet (10 mg total) by mouth 2 (two) times daily as needed for muscle spasms. 20 tablet 0  . furosemide (LASIX) 40 MG tablet Take 40 mg by mouth 2 (two) times daily.  5  . hydrALAZINE (APRESOLINE) 100 MG tablet Take 1 tablet (100 mg total) by mouth  every 8 (eight) hours. 90 tablet 0  . insulin aspart protamine - aspart (NOVOLOG MIX 70/30 FLEXPEN) (70-30) 100 UNIT/ML FlexPen Inject 0.1 mLs (10 Units total) into the skin 2 (two) times daily. 15 mL 11  . labetalol (NORMODYNE) 200 MG tablet Take 200 mg by mouth 2 (two) times daily.    Marland Kitchen losartan (COZAAR) 50 MG tablet Take 1 tablet (50 mg total) by mouth 2 (two) times daily. 180 tablet 0  . Magnesium 250 MG TABS Take 250 mg by mouth daily.     . metFORMIN (GLUCOPHAGE-XR) 500 MG 24 hr tablet Take 1 tablet (500 mg total) by mouth daily.  2  . oxyCODONE-acetaminophen (PERCOCET) 5-325 MG per tablet Take one or two tablets by mouth every 6 hours as needed for pain. 30 tablet 0  . potassium chloride SA (K-DUR,KLOR-CON) 20 MEQ tablet Take 2 tablets (40 mEq total) by mouth daily. 60 tablet 11  . rosuvastatin (CRESTOR) 20 MG tablet Take 20 mg by mouth daily.     No current facility-administered medications for this visit.    Allergies:   Minoxidil; Lipitor; and Ace inhibitors    Social History:  The patient  reports that she has never smoked. She has never used  smokeless tobacco. She reports that she does not drink alcohol or use illicit drugs.   Family History:  The patient's family history includes Breast cancer in her sister; Cancer in her sister; Heart attack in her brother; Stroke in her mother.    ROS:   Please see the history of present illness.   Review of Systems  All other systems reviewed and are negative.     PHYSICAL EXAM: VS:  There were no vitals taken for this visit.    Wt Readings from Last 3 Encounters:  07/08/15 180 lb (81.647 kg)  07/06/15 180 lb (81.647 kg)  07/06/15 181 lb 14.1 oz (82.5 kg)     GEN: Well nourished, well developed, in no acute distress HEENT: normal Neck: no JVD,   no masses Cardiac:  Normal S1/S2, RRR; 2/6 holosystolic murmur LLSB,  no rubs or gallops, trace bilateral ankle edema   Respiratory:  clear to auscultation bilaterally, no wheezing, rhonchi or rales. GI: soft, nontender, nondistended, + BS MS: no deformity or atrophy Skin: warm and dry  Neuro:  CNs II-XII intact, Strength and sensation are intact Psych: Normal affect   EKG:  EKG is ordered today.  It demonstrates:   NSR, HR 81, 1st degree AVB (PR 236 ms), NSSTTW changes, QTc 492 ms, no change from prior tracing.    Recent Labs: 05/27/2015: B Natriuretic Peptide 205.6* 06/29/2015: Magnesium 2.0; TSH 3.187 07/08/2015: ALT 8*; BUN 18; Creatinine, Ser 1.40*; Hemoglobin 9.9*; Platelets 146*; Potassium 2.9*; Sodium 145    Lipid Panel    Component Value Date/Time   CHOL 135 06/29/2015 1053   TRIG 97 06/29/2015 1053   HDL 53 06/29/2015 1053   CHOLHDL 2.5 06/29/2015 1053   VLDL 19 06/29/2015 1053   LDLCALC 63 06/29/2015 1053      ASSESSMENT AND PLAN:  Severe mitral regurgitation:  As noted, I reviewed her case with Dr. [name] last week.  I will arrange a R and L heart cath.  She will need minimal dye used given CKD.  She will likely need referral to Dr. Roxy Manns post cath to consider MV surgery.  Risks and benefits of cardiac  catheterization have been discussed with the patient.  These include bleeding, infection, kidney damage, stroke,  heart attack, death.  The patient understands these risks and is willing to proceed.   Chronic diastolic CHF (congestive heart failure):  Volume appears stable.  Continue current dose of Lasix.  Check BMET today.  Essential hypertension:  Fair control.  Continue current Rx.   Hyperlipidemia:  Continue statin.   CKD (chronic kidney disease) stage 4, GFR 15-29 ml/min:  Check BMET today. Will hold Losartan and Lasix beginning the night before the cath.  Will hold Metformin and Invokana 24 hours before and 48 hours after the cath.  Will bring her in several hours before her cath for gentle hydration.  Minimal dye should be used and no LV gram should be done.   IDDM (insulin dependent diabetes mellitus):  As noted, Invokana will need to be held as it can increase Creatinine.  Metformin will be held according to protocol.       Medication Changes: Current medicines are reviewed at length with the patient today.  Concerns regarding medicines are as outlined above.  The following changes have been made:   Discontinued Medications   No medications on file   Modified Medications   No medications on file   New Prescriptions   No medications on file     Labs/ tests ordered today include:   No orders of the defined types were placed in this encounter.     Disposition:   FU with Dr. [name] after her heart cath.    Signed, Versie Starks, MHS 07/27/2015 8:43 PM    [facility] [place], [place],   [address] Phone: [phone]; Fax: [phone]    This encounter was created in error - please disregard.

## 2015-07-28 ENCOUNTER — Encounter: Payer: BLUE CROSS/BLUE SHIELD | Admitting: Physician Assistant

## 2015-08-09 ENCOUNTER — Ambulatory Visit: Payer: BLUE CROSS/BLUE SHIELD | Admitting: Cardiology

## 2015-09-05 DIAGNOSIS — Z9281 Personal history of extracorporeal membrane oxygenation (ECMO): Secondary | ICD-10-CM

## 2015-09-06 ENCOUNTER — Other Ambulatory Visit: Payer: BLUE CROSS/BLUE SHIELD

## 2015-09-06 ENCOUNTER — Telehealth: Payer: Self-pay | Admitting: *Deleted

## 2015-09-06 DIAGNOSIS — Z7901 Long term (current) use of anticoagulants: Secondary | ICD-10-CM | POA: Diagnosis not present

## 2015-09-06 DIAGNOSIS — Z5181 Encounter for therapeutic drug level monitoring: Secondary | ICD-10-CM

## 2015-09-06 NOTE — Telephone Encounter (Signed)
Jody Nguyen from [facility] called and requested orders for skilled nursing and PT evaluation and treat.  OK per Dr Melford Aase.

## 2015-09-07 LAB — PROTIME-INR
INR: 3.07 — ABNORMAL HIGH (ref <1.50)
Prothrombin Time: 32.2 seconds — ABNORMAL HIGH (ref 11.6–15.2)

## 2015-09-08 NOTE — Progress Notes (Signed)
HPI: Follow-up hypertension, diastolic heart failure and previous mitral valve repair. Renal dopplers Nov 2013 showed no RAS. Note previous pericardial effusion with minoxidil.  Transesophageal echocardiogram July 2016 showed normal LV function, moderate to severe left atrial enlargement, small pericardial effusion, flail segment of anterior mitral valve leaflet with severe mitral regurgitation and mild tricuspid regurgitation. Cardiac catheterization September 2016 showed nonobstructive coronary disease and severely elevated pulmonary pressures. Carotid Dopplers August 2016 showed 1-39% bilateral stenosis. Patient had mitral valve repair in August 2016. Patient's course was complicated by acute hypoxic respiratory failure refractory to conventional ventilatory support. She was transferred to [facility] for ECMO. Course was complicated by need for tracheostomy. She also developed acute on chronic renal failure and now requires dialysis. Follow-up echocardiogram at [facility] showed ejection fraction 40%. Note I do not have all records available. Since she was last seen, She has mild dyspnea on exertion and fatigue which is slowly improving with activities. No orthopnea, PND, pedal edema, chest pain or syncope.    Current Outpatient Prescriptions  Medication Sig Dispense Refill  . acetaminophen (TYLENOL) 325 MG tablet Take 1 tablet by mouth daily as needed.    Marland Kitchen amiodarone (PACERONE) 200 MG tablet Take 1 tablet (200 mg total) by mouth 2 (two) times daily with a meal. (Patient taking differently: Take 200 mg by mouth 2 (two) times daily with a meal. Starts this medication on 07/01/15) 60 tablet 0  . aspirin 81 MG tablet Take 81 mg by mouth daily.      Marland Kitchen aspirin EC 81 MG tablet Take by mouth.    . busPIRone (BUSPAR) 10 MG tablet Take 1 tablet by mouth 2 (two) times daily.    . carvedilol (COREG) 12.5 MG tablet Take 1 tablet by mouth 2 (two) times daily.    . Cholecalciferol (VITAMIN D-3) 5000 UNITS  TABS Take 5,000 Units by mouth daily.     . hydrALAZINE (APRESOLINE) 100 MG tablet Take 1 tablet (100 mg total) by mouth every 8 (eight) hours. 90 tablet 0  . insulin aspart protamine - aspart (NOVOLOG 70/30 MIX) (70-30) 100 UNIT/ML FlexPen Inject into the skin.    Marland Kitchen insulin aspart protamine - aspart (NOVOLOG MIX 70/30 FLEXPEN) (70-30) 100 UNIT/ML FlexPen Inject 0.1 mLs (10 Units total) into the skin 2 (two) times daily. 15 mL 11  . Insulin Pen Needle (FIFTY50 PEN NEEDLES) 31G X 5 MM MISC Use as directed.    Elmore Guise Devices ([employer] LANCING DEVICE) MISC Use 1 each 3 (three) times daily. Product selection permitted according to insurance preference. E11.9 Type 2 diabetes mellitus    . rosuvastatin (CRESTOR) 20 MG tablet Take 20 mg by mouth daily.    . sertraline (ZOLOFT) 25 MG tablet Take 1 tablet by mouth daily.    Marland Kitchen warfarin (COUMADIN) 7.5 MG tablet Take by mouth.     No current facility-administered medications for this visit.     Past Medical History  Diagnosis Date  . HLD (hyperlipidemia)     takes Crestor daily  . Vitamin D deficiency   . CVA (cerebral infarction) 1997    no residual deficit  . Anemia   . CKD (chronic kidney disease) stage 4, GFR 15-29 ml/min (HCC)   . Diverticulitis   . History of cardiovascular stress test     a. Myoview Oct 2012 showed EF 49%, no ischemia, LVE  . Pericardial effusion     chronic; felt to be poss related to minoxidil >> DC'd  .  S/P cardiac catheterization     a. R/L HC 06/18/15:  mLAD 30%; severe pulmo HTN with PA sat 43%, CI 1.86, prominent V waves indicative of MR; resting hypoxemia O2 sat 86% on RA  . Pulmonary hypertension ([place]) 06/18/2015  . Severe mitral regurgitation   . Chronic diastolic heart failure ([place])   . Stroke ([place]) 1997    no residual effect  . Heart murmur   . Shortness of breath dyspnea   . Constipation   . Hypertension     takes Hydralazine,Losartan,and Labetalol daily  . DM (diabetes mellitus) ([place])     takes Novolog  and Metformin daily  . Chronic diastolic CHF (congestive heart failure) (HCC)     takes Furosemide daily  . Respiratory failure, acute hypoxic, post-operative 07/08/2015    Requiring ECMO support  . S/P minimally invasive mitral valve repair 07/08/2015    Complex valvuloplasty including artificial Gore-tex neochord placement x10 and 26 mm Sorin Memo 3D Rechord ring annuloplasty via right mini thoracotomy approach    Past Surgical History  Procedure Laterality Date  . Cesarean section      x 2  . Tee without cardioversion N/A 06/08/2015    Procedure: TRANSESOPHAGEAL ECHOCARDIOGRAM (TEE);  Surgeon: Lelon Perla, MD;  Location: [facility] ENDOSCOPY;  Service: Cardiovascular;  Laterality: N/A;  . Cardiac catheterization    . Cardiac catheterization N/A 06/18/2015    Procedure: Right/Left Heart Cath and Coronary Angiography;  Surgeon: Jettie Booze, MD;  Location: [place] CV LAB;  Service: Cardiovascular;  Laterality: N/A;  . Colonoscopy    . Multiple extractions with alveoloplasty N/A 06/30/2015    Procedure: MULTIPLE EXTRACTIONS OF TOOTH #'S 4  AND 30 WITH ALVEOLOPLASTY AND GROSS DEBRIDEMENT  OF REMAINING TEETH;  Surgeon: Lenn Cal, DDS;  Location: [place];  Service: Oral Surgery;  Laterality: N/A;  . Mitral valve repair Right 07/08/2015    Procedure: MINIMALLY INVASIVE MITRAL VALVE REPAIR with a 26 Sorin Memo 3D Rechord;  Surgeon: Rexene Alberts, MD;  Location: [place];  Service: Open Heart Surgery;  Laterality: Right;  . Tee without cardioversion N/A 07/08/2015    Procedure: TRANSESOPHAGEAL ECHOCARDIOGRAM (TEE);  Surgeon: Rexene Alberts, MD;  Location: [place];  Service: Open Heart Surgery;  Laterality: N/A;  . Cannulation for cardiopulmonary bypass N/A 07/08/2015    Procedure: CANNULATION FOR ECMO;  Surgeon: Rexene Alberts, MD;  Location: [place];  Service: Open Heart Surgery;  Laterality: N/A;    Social History   Social History  . Marital Status: Married    Spouse Name: N/A  .  Number of Children: 3  . Years of Education: N/A   Occupational History  .      Unemployed; used to work as a Radio broadcast assistant   Social History Main Topics  . Smoking status: Never Smoker   . Smokeless tobacco: Never Used  . Alcohol Use: No  . Drug Use: No  . Sexual Activity: Yes    Birth Control/ Protection: None   Other Topics Concern  . Not on file   Social History Narrative    ROS: no fevers or chills, productive cough, hemoptysis, dysphasia, odynophagia, melena, hematochezia, dysuria, hematuria, rash, seizure activity, orthopnea, PND, pedal edema, claudication. Remaining systems are negative.  Physical Exam: Well-developed well-nourished in no acute distress.  Skin is warm and dry.  HEENT is normal.  Neck is supple. Status post tracheostomy. Chest is clear to auscultation with normal expansion. Dialysis catheter right upper chest.  Suture and left upper chest from previous ECMO Cardiovascular exam is regular rate and rhythm.  Abdominal exam nontender or distended. No masses palpated. Suture right groin. Extremities show no edema. neuro grossly intact  Electrocardiogram shows sinus rhythm, PVCs, nonspecific T-wave changes.

## 2015-09-10 ENCOUNTER — Encounter: Payer: Self-pay | Admitting: Cardiology

## 2015-09-10 ENCOUNTER — Ambulatory Visit (INDEPENDENT_AMBULATORY_CARE_PROVIDER_SITE_OTHER): Payer: BLUE CROSS/BLUE SHIELD | Admitting: Cardiology

## 2015-09-10 VITALS — BP 120/80 | HR 70 | Ht 60.0 in | Wt 158.2 lb

## 2015-09-10 DIAGNOSIS — I10 Essential (primary) hypertension: Secondary | ICD-10-CM

## 2015-09-10 DIAGNOSIS — I429 Cardiomyopathy, unspecified: Secondary | ICD-10-CM

## 2015-09-10 DIAGNOSIS — I4891 Unspecified atrial fibrillation: Secondary | ICD-10-CM | POA: Insufficient documentation

## 2015-09-10 DIAGNOSIS — I5032 Chronic diastolic (congestive) heart failure: Secondary | ICD-10-CM

## 2015-09-10 DIAGNOSIS — N184 Chronic kidney disease, stage 4 (severe): Secondary | ICD-10-CM

## 2015-09-10 DIAGNOSIS — E785 Hyperlipidemia, unspecified: Secondary | ICD-10-CM | POA: Diagnosis not present

## 2015-09-10 DIAGNOSIS — Z9889 Other specified postprocedural states: Secondary | ICD-10-CM | POA: Diagnosis not present

## 2015-09-10 DIAGNOSIS — N186 End stage renal disease: Secondary | ICD-10-CM

## 2015-09-10 DIAGNOSIS — I480 Paroxysmal atrial fibrillation: Secondary | ICD-10-CM

## 2015-09-10 MED ORDER — AMIODARONE HCL 200 MG PO TABS: 200.0000 mg | ORAL_TABLET | Freq: Every day | ORAL | Status: DC

## 2015-09-10 NOTE — Assessment & Plan Note (Addendum)
Plan SBE prophylaxis. Course was complicated by barotrauma to right lung and prolonged Ecmo. She also underwent tracheostomy. She is slowly regaining strength. She will follow-up at [facility] next week.

## 2015-09-10 NOTE — Assessment & Plan Note (Signed)
Patient is on amiodarone and Coumadin I presume related to postoperative atrial fibrillation. I do not have final records available and we will obtain these. She is in sinus rhythm today. Decrease amiodarone to 200 mg daily. Continue Coumadin and referred to the Coumadin clinic. If indeed this was all postoperative atrial fibrillation I will likely discontinue amiodarone and Coumadin in 3 months if she holds sinus rhythm.

## 2015-09-10 NOTE — Assessment & Plan Note (Signed)
Continue statin. 

## 2015-09-10 NOTE — Assessment & Plan Note (Addendum)
Volume status appears to be good and controlled now with dialysis.

## 2015-09-10 NOTE — Patient Instructions (Signed)
Medication Instructions:   DECREASE AMIODARONE TO 200 MG ONCE DAILY  Labwork:  CVRR FOR PROTIME CHECK NEXT WEEK  Follow-Up:  Your physician recommends that you schedule a follow-up appointment in: [place] ASAP

## 2015-09-10 NOTE — Assessment & Plan Note (Addendum)
Patient developed dialysis dependent renal failure following recent hospitalization. She is being dialyzed here in [place] but does not have a nephrologist. We will arrange for her to be seen. I would like for them to make a decision soon concerning need for AV fistula. We would like to have dialysis catheter removed as soon as possible due to risk of infection and recent mitral valve repair/risk of endocarditis.  Discussed with Dr. Posey Pronto; he will arrange nephrology evaluation.

## 2015-09-10 NOTE — Assessment & Plan Note (Signed)
Blood pressure controlled. Continue present medications. 

## 2015-09-10 NOTE — Assessment & Plan Note (Signed)
Continue beta blocker and hydralazine. I will plan to repeat her echocardiogram in 3 months.

## 2015-09-14 ENCOUNTER — Ambulatory Visit (INDEPENDENT_AMBULATORY_CARE_PROVIDER_SITE_OTHER): Payer: BLUE CROSS/BLUE SHIELD | Admitting: Pharmacist Clinician (PhC)/ Clinical Pharmacy Specialist

## 2015-09-14 DIAGNOSIS — Z9889 Other specified postprocedural states: Secondary | ICD-10-CM

## 2015-09-14 DIAGNOSIS — Z7901 Long term (current) use of anticoagulants: Secondary | ICD-10-CM | POA: Diagnosis not present

## 2015-09-14 LAB — POCT INR: INR: 3.3

## 2015-09-16 ENCOUNTER — Other Ambulatory Visit: Payer: Self-pay | Admitting: Internal Medicine

## 2015-09-16 ENCOUNTER — Telehealth: Payer: Self-pay | Admitting: *Deleted

## 2015-09-16 NOTE — Telephone Encounter (Signed)
Betty from [place] called and reported patient's BP is low at 90/60.  Patient is taking Hydralazine 100 mg tid.  Per Dr Melford Aase, reduce dose to 1/2 tablet tid.  Nurse advised.

## 2015-09-20 ENCOUNTER — Other Ambulatory Visit: Payer: BLUE CROSS/BLUE SHIELD

## 2015-09-20 ENCOUNTER — Ambulatory Visit (INDEPENDENT_AMBULATORY_CARE_PROVIDER_SITE_OTHER): Payer: BLUE CROSS/BLUE SHIELD | Admitting: Pharmacist Clinician (PhC)/ Clinical Pharmacy Specialist

## 2015-09-20 ENCOUNTER — Other Ambulatory Visit: Payer: Self-pay | Admitting: *Deleted

## 2015-09-20 DIAGNOSIS — Z7901 Long term (current) use of anticoagulants: Secondary | ICD-10-CM

## 2015-09-20 DIAGNOSIS — Z01810 Encounter for preprocedural cardiovascular examination: Secondary | ICD-10-CM

## 2015-09-20 DIAGNOSIS — N186 End stage renal disease: Secondary | ICD-10-CM

## 2015-09-20 DIAGNOSIS — Z9889 Other specified postprocedural states: Secondary | ICD-10-CM | POA: Diagnosis not present

## 2015-09-20 LAB — POCT INR: INR: 2.3

## 2015-09-20 LAB — PROTIME-INR
INR: 2.07 — ABNORMAL HIGH (ref <1.50)
Prothrombin Time: 23.6 seconds — ABNORMAL HIGH (ref 11.6–15.2)

## 2015-09-28 ENCOUNTER — Ambulatory Visit (INDEPENDENT_AMBULATORY_CARE_PROVIDER_SITE_OTHER): Payer: BLUE CROSS/BLUE SHIELD | Admitting: Pharmacist Clinician (PhC)/ Clinical Pharmacy Specialist

## 2015-09-28 DIAGNOSIS — Z7901 Long term (current) use of anticoagulants: Secondary | ICD-10-CM | POA: Diagnosis not present

## 2015-09-28 DIAGNOSIS — Z9889 Other specified postprocedural states: Secondary | ICD-10-CM | POA: Diagnosis not present

## 2015-09-28 LAB — POCT INR: INR: 1.2

## 2015-09-29 ENCOUNTER — Ambulatory Visit: Payer: Self-pay | Admitting: Internal Medicine

## 2015-10-01 ENCOUNTER — Other Ambulatory Visit: Payer: Self-pay | Admitting: Thoracic Surgery (Cardiothoracic Vascular Surgery)

## 2015-10-01 DIAGNOSIS — I340 Nonrheumatic mitral (valve) insufficiency: Secondary | ICD-10-CM

## 2015-10-04 ENCOUNTER — Ambulatory Visit: Payer: Self-pay | Admitting: Thoracic Surgery (Cardiothoracic Vascular Surgery)

## 2015-10-05 ENCOUNTER — Ambulatory Visit (INDEPENDENT_AMBULATORY_CARE_PROVIDER_SITE_OTHER): Payer: Self-pay | Admitting: Thoracic Surgery (Cardiothoracic Vascular Surgery)

## 2015-10-05 ENCOUNTER — Encounter: Payer: Self-pay | Admitting: Thoracic Surgery (Cardiothoracic Vascular Surgery)

## 2015-10-05 ENCOUNTER — Ambulatory Visit
Admission: RE | Admit: 2015-10-05 | Discharge: 2015-10-05 | Disposition: A | Discharge status: Home / Self Care | Payer: BLUE CROSS/BLUE SHIELD | Source: Ambulatory Visit | Attending: Thoracic Surgery (Cardiothoracic Vascular Surgery) | Admitting: Thoracic Surgery (Cardiothoracic Vascular Surgery)

## 2015-10-05 VITALS — BP 130/80 | HR 84 | Resp 20 | Ht 60.0 in | Wt 158.0 lb

## 2015-10-05 DIAGNOSIS — I340 Nonrheumatic mitral (valve) insufficiency: Secondary | ICD-10-CM

## 2015-10-05 DIAGNOSIS — Z9889 Other specified postprocedural states: Secondary | ICD-10-CM

## 2015-10-05 DIAGNOSIS — J9601 Acute respiratory failure with hypoxia: Secondary | ICD-10-CM

## 2015-10-05 NOTE — Progress Notes (Signed)
[address]       [place] [address]             [phone]     CARDIOTHORACIC SURGERY OFFICE NOTE  Referring Provider is CRENSHAW, Denice Bors, MD PCP is Alesia Richards, MD   HPI:  Patient returns to the office today for follow-up status post minimally invasive mitral valve repair on 07/08/2015. Her intraoperative course was complicated by the development of profound acute hypoxemic respiratory failure secondary to severe acute lung injury afflicting the right lung. She failed attempts that management of this situation using adjustment to conventional ventilation and subsequently was treated using extracorporeal membrane oxygenator support. She was transferred to [facility] where she gradually recovered over a two-month period of time.  Details of her hospitalization at [facility] or not completely available, but she required initiation of hemodialysis for acute on chronic renal failure. She was eventually discharged home.  She has been dialyzing 3 times a week through a tunneled dialysis catheter. She has been seen in follow-up by Dr. Norm Parcel at [facility] and by Dr. Stanford Breed at [facility].   She returns to our office for routine follow-up today. She reports that she continues to make excellent progress. Her strength is improving. She denies any exertional shortness of breath. Her appetite is good. She is now sleeping much better at night. She has not had any problems with dialysis therapy. She has been scheduled for an appointment at [facility] for possible placement of a fistula for long-term dialysis access. The patient is making some urine.  She remains on Coumadin for anticoagulation related to her recent mitral valve repair and postoperative atrial fibrillation. Her most recent INR remained subtherapeutic. Amiodarone was recently stopped.   Current Outpatient Prescriptions  Medication Sig Dispense Refill    . aspirin 81 MG tablet Take 81 mg by mouth daily.      . busPIRone (BUSPAR) 10 MG tablet Take 1 tablet by mouth 2 (two) times daily.    . carvedilol (COREG) 12.5 MG tablet Take 1 tablet by mouth 2 (two) times daily.    . Cholecalciferol (VITAMIN D-3) 5000 UNITS TABS Take 5,000 Units by mouth daily.     . hydrALAZINE (APRESOLINE) 100 MG tablet Take 1 tablet (100 mg total) by mouth every 8 (eight) hours. (Patient taking differently: Take 50 mg by mouth every 8 (eight) hours. Takes 50 mg TID) 90 tablet 0  . insulin aspart protamine - aspart (NOVOLOG MIX 70/30 FLEXPEN) (70-30) 100 UNIT/ML FlexPen Inject 0.1 mLs (10 Units total) into the skin 2 (two) times daily. 15 mL 11  . Insulin Pen Needle (FIFTY50 PEN NEEDLES) 31G X 5 MM MISC Use as directed.    Elmore Guise Devices ([employer] LANCING DEVICE) MISC Use 1 each 3 (three) times daily. Product selection permitted according to insurance preference. E11.9 Type 2 diabetes mellitus    . rosuvastatin (CRESTOR) 20 MG tablet Take 20 mg by mouth daily.    Marland Kitchen warfarin (COUMADIN) 7.5 MG tablet Take by mouth.     No current facility-administered medications for this visit.      Physical Exam:   BP 130/80 mmHg  Pulse 84  Resp 20  Ht 5' (1.524 m)  Wt 158 lb (71.668 kg)  BMI 30.86 kg/m2  SpO2 96%  General:  Well-appearing  Chest:   Clear to auscultation  CV:   Regular rate and rhythm without murmur  Incisions:  Well-healed, sutures removed from dialysis catheter placement site  Abdomen:  Soft and nontender  Extremities:  Warm and well perfused with no lower extremity edema  Diagnostic Tests:  CHEST 2 VIEW  COMPARISON: 07/08/2015.  FINDINGS: Dialysis catheter noted in good anatomic position. Interval removal of endotracheal tube, Swan-Ganz catheter, right chest tube, mediastinal drainage tube. Stable cardiomegaly. Prior cardiac valve replacement. No prominent pulmonary venous congestion. Low lung volumes with mild basilar atelectasis. No pleural  effusion or pneumothorax  IMPRESSION: 1. Dialysis catheter noted with its tip projected at the cavoatrial junction. Interval removal of endotracheal tube, Swan-Ganz catheter, right chest tube, mediastinal drainage tube. No pneumothorax. 2. Prior cardiac valve replacement. Stable cardiomegaly. No pulmonary venous congestion. 3. Bibasilar mild atelectasis.   Electronically Signed  By: Marcello Moores Register  On: 10/05/2015 08:56    Impression:  Patient appears to be doing remarkably well nearly 3 months following minimally invasive mitral valve repair that was complicated by acute hypoxemic respiratory failure requiring ECMO support.  At this point she looks remarkably good although she does remain on hemodialysis.     Plan:  We have not recommended any changes to the patient's current medications at this time. I agree with Dr. Jacalyn Lefevre plan to stop Coumadin in the near future if the patient continues to maintain sinus rhythm. I've encouraged the patient enroll in participate in outpatient cardiac rehabilitation program. I have reminded the patient regarding the lifelong need for antibiotic prophylaxis for all dental cleanings and related procedures. I have also encouraged the patient to proceed with fistula placement for long-term dialysis access so that her temporary dialysis catheter can be removed as soon as possible.  Concerns regarding the possible risk of bacterial endocarditis have been explained. I discussed the complications related to the patient's original surgical procedure that required ECMO support with the patient and her husband at length. All of their questions have been addressed.   [name], MD 10/05/2015 10:10 AM

## 2015-10-05 NOTE — Patient Instructions (Addendum)
Continue all previous medications without any changes at this time  You may resume unrestricted physical activity without any particular limitations at this time.  You are encouraged to enroll and participate in the outpatient cardiac rehab program beginning as soon as practical.  Make every attempt to establish long term dialysis access as soon as possible and have temporary dialysis catheter removed as soon as possible  Endocarditis is a potentially serious infection of heart valves or inside lining of the heart.  It occurs more commonly in patients with diseased heart valves (such as patient's with aortic or mitral valve disease) and in patients who have undergone heart valve repair or replacement.  Certain surgical and dental procedures may put you at risk, such as dental cleaning, other dental procedures, or any surgery involving the respiratory, urinary, gastrointestinal tract, gallbladder or prostate gland.   To minimize your chances for develooping endocarditis, maintain good oral health and seek prompt medical attention for any infections involving the mouth, teeth, gums, skin or urinary tract.    Always notify your doctor or dentist about your underlying heart valve condition before having any invasive procedures. You will need to take antibiotics before certain procedures, including all routine dental cleanings or other dental procedures.  Your cardiologist or dentist should prescribe these antibiotics for you to be taken ahead of time.

## 2015-10-06 ENCOUNTER — Encounter: Payer: Self-pay | Admitting: *Deleted

## 2015-10-06 ENCOUNTER — Ambulatory Visit (INDEPENDENT_AMBULATORY_CARE_PROVIDER_SITE_OTHER): Payer: BLUE CROSS/BLUE SHIELD | Admitting: Pharmacist Clinician (PhC)/ Clinical Pharmacy Specialist

## 2015-10-06 DIAGNOSIS — Z7901 Long term (current) use of anticoagulants: Secondary | ICD-10-CM | POA: Diagnosis not present

## 2015-10-06 DIAGNOSIS — Z9889 Other specified postprocedural states: Secondary | ICD-10-CM | POA: Diagnosis not present

## 2015-10-06 LAB — POCT INR: INR: 1.4

## 2015-10-06 NOTE — Progress Notes (Signed)
Patient ID: Jody Nguyen, female   DOB: 1956-07-05, 59 y.o.   MRN: [id number] Jody Nguyen has been referred to [facility] [place] cardiac rehab for Phase II per Dr. Helene Kelp request.

## 2015-10-07 ENCOUNTER — Encounter: Payer: Self-pay | Admitting: Internal Medicine

## 2015-10-07 ENCOUNTER — Ambulatory Visit (INDEPENDENT_AMBULATORY_CARE_PROVIDER_SITE_OTHER): Payer: BLUE CROSS/BLUE SHIELD | Admitting: Internal Medicine

## 2015-10-07 VITALS — BP 118/74 | HR 72 | Temp 98.0°F | Resp 16 | Ht 60.0 in | Wt 160.0 lb

## 2015-10-07 DIAGNOSIS — E785 Hyperlipidemia, unspecified: Secondary | ICD-10-CM | POA: Diagnosis not present

## 2015-10-07 DIAGNOSIS — IMO0001 Reserved for inherently not codable concepts without codable children: Secondary | ICD-10-CM

## 2015-10-07 DIAGNOSIS — Z79899 Other long term (current) drug therapy: Secondary | ICD-10-CM | POA: Diagnosis not present

## 2015-10-07 DIAGNOSIS — E1129 Type 2 diabetes mellitus with other diabetic kidney complication: Secondary | ICD-10-CM

## 2015-10-07 DIAGNOSIS — Z6831 Body mass index (BMI) 31.0-31.9, adult: Secondary | ICD-10-CM

## 2015-10-07 DIAGNOSIS — E1021 Type 1 diabetes mellitus with diabetic nephropathy: Secondary | ICD-10-CM | POA: Diagnosis not present

## 2015-10-07 DIAGNOSIS — E559 Vitamin D deficiency, unspecified: Secondary | ICD-10-CM

## 2015-10-07 DIAGNOSIS — N184 Chronic kidney disease, stage 4 (severe): Secondary | ICD-10-CM

## 2015-10-07 DIAGNOSIS — I150 Renovascular hypertension: Secondary | ICD-10-CM | POA: Diagnosis not present

## 2015-10-07 DIAGNOSIS — N186 End stage renal disease: Secondary | ICD-10-CM | POA: Diagnosis not present

## 2015-10-07 DIAGNOSIS — Z992 Dependence on renal dialysis: Secondary | ICD-10-CM

## 2015-10-07 DIAGNOSIS — Z794 Long term (current) use of insulin: Secondary | ICD-10-CM

## 2015-10-07 LAB — BASIC METABOLIC PANEL WITH GFR
BUN: 32 mg/dL — ABNORMAL HIGH (ref 7–25)
CO2: 27 mmol/L (ref 20–31)
Calcium: 9.4 mg/dL (ref 8.6–10.4)
Chloride: 97 mmol/L — ABNORMAL LOW (ref 98–110)
Creat: 5.28 mg/dL — ABNORMAL HIGH (ref 0.50–0.99)
GFR, Est African American: 9 mL/min — ABNORMAL LOW (ref >=60)
GFR, Est Non African American: 8 mL/min — ABNORMAL LOW (ref >=60)
Glucose, Bld: 141 mg/dL — ABNORMAL HIGH (ref 65–99)
Potassium: 3.8 mmol/L (ref 3.5–5.3)
Sodium: 136 mmol/L (ref 135–146)

## 2015-10-07 LAB — CBC WITH DIFFERENTIAL/PLATELET
Basophils Absolute: 0.1 10*3/uL (ref 0.0–0.1)
Basophils Relative: 1 % (ref 0–1)
Eosinophils Absolute: 0.3 10*3/uL (ref 0.0–0.7)
Eosinophils Relative: 6 % — ABNORMAL HIGH (ref 0–5)
HCT: 34.8 % — ABNORMAL LOW (ref 36.0–46.0)
Hemoglobin: 10.8 g/dL — ABNORMAL LOW (ref 12.0–15.0)
Lymphocytes Relative: 36 % (ref 12–46)
Lymphs Abs: 1.9 10*3/uL (ref 0.7–4.0)
MCH: 26.3 pg (ref 26.0–34.0)
MCHC: 31 g/dL (ref 30.0–36.0)
MCV: 84.9 fL (ref 78.0–100.0)
MPV: 10.3 fL (ref 8.6–12.4)
Monocytes Absolute: 0.5 10*3/uL (ref 0.1–1.0)
Monocytes Relative: 10 % (ref 3–12)
Neutro Abs: 2.5 10*3/uL (ref 1.7–7.7)
Neutrophils Relative %: 47 % (ref 43–77)
Platelets: 297 10*3/uL (ref 150–400)
RBC: 4.1 MIL/uL (ref 3.87–5.11)
RDW: 15.5 % (ref 11.5–15.5)
WBC: 5.4 10*3/uL (ref 4.0–10.5)

## 2015-10-07 LAB — LIPID PANEL
Cholesterol: 165 mg/dL (ref 125–200)
HDL: 47 mg/dL (ref >=46)
LDL Cholesterol: 76 mg/dL (ref <130)
Total CHOL/HDL Ratio: 3.5 Ratio (ref <=5.0)
Triglycerides: 210 mg/dL — ABNORMAL HIGH (ref <150)
VLDL: 42 mg/dL — ABNORMAL HIGH (ref <30)

## 2015-10-07 LAB — HEPATIC FUNCTION PANEL
ALT: 12 U/L (ref 6–29)
AST: 16 U/L (ref 10–35)
Albumin: 4.1 g/dL (ref 3.6–5.1)
Alkaline Phosphatase: 68 U/L (ref 33–130)
Bilirubin, Direct: 0.1 mg/dL (ref <=0.2)
Indirect Bilirubin: 0.3 mg/dL (ref 0.2–1.2)
Total Bilirubin: 0.4 mg/dL (ref 0.2–1.2)
Total Protein: 7.7 g/dL (ref 6.1–8.1)

## 2015-10-07 LAB — TSH: TSH: 3.199 u[IU]/mL (ref 0.350–4.500)

## 2015-10-07 LAB — MAGNESIUM: Magnesium: 2 mg/dL (ref 1.5–2.5)

## 2015-10-07 NOTE — Patient Instructions (Signed)

## 2015-10-08 LAB — HEMOGLOBIN A1C
Hgb A1c MFr Bld: 5.5 % (ref <5.7)
Mean Plasma Glucose: 111 mg/dL (ref <117)

## 2015-10-08 LAB — VITAMIN D 25 HYDROXY (VIT D DEFICIENCY, FRACTURES): Vit D, 25-Hydroxy: 54 ng/mL (ref 30–100)

## 2015-10-09 ENCOUNTER — Encounter: Payer: Self-pay | Admitting: Internal Medicine

## 2015-10-09 DIAGNOSIS — N186 End stage renal disease: Secondary | ICD-10-CM | POA: Insufficient documentation

## 2015-10-09 DIAGNOSIS — Z992 Dependence on renal dialysis: Secondary | ICD-10-CM

## 2015-10-09 NOTE — Progress Notes (Signed)
Patient ID: Jody Nguyen, female   DOB: 1956/09/10, 59 y.o.   MRN: [id number]     This very nice 59 y.o. [name] presents for  follow up with Hypertension, Hyperlipidemia, Ins Dep T2_DM w/CKD and Vitamin D Deficiency. Patient recently underwent minimally invasive Mitral valve repair for severe Mitral Insufficiency and developed po Respiratory Failure requiring transfer to [facility] for Trach and ECMO support and a 2 mo hospitalization with initiation of dialysis which continues to date. Clinically patient reports feeling well and apparently tolerating dialysis. Emotionally seems reconciled with her po complications. Since surgery, patient remains on coumadin for po pAfib managed at [facility] Coumadin clinic.      Patient is treated for HTN since 1999 & BP has been labile at times. Today's BP: 118/74 mmHg. Patient has had no complaints of any cardiac type chest pain, palpitations, dyspnea/orthopnea/PND, dizziness, claudication, or dependent edema.     Hyperlipidemia is controlled with diet & meds. Patient denies myalgias or other med SE's. Current Lipids are at goal with cholesterol 165; HDL 47; LDL 76; but with elevated Triglycerides 210.      Also, the patient has history of Insulin dependent T2_DM since 2006  and has had no symptoms of reactive hypoglycemia, diabetic polys, paresthesias or visual blurring.  Reports recent CBG's ranging betw 105-130 mg%. Current A1c is at goal with A1c 5.5%.      Further, the patient also has history of Vitamin D Deficiency of "12" in 2008 and supplements vitamin D without any suspected side-effects. Current vitamin D is 54.    Medication Sig  . aspirin 81 MG  Take 81 mg by mouth daily.    . busPIRone 10 MG  Take 1 tablet by mouth 2 (two) times daily.  . carvedilol  12.5 MG Take 1 tablet by mouth 2 (two) times daily.  Marland Kitchen VITAMIN D 5000 UNITS  Take 5,000 Units by mouth daily.   . hydrALAZINE  100 MG  Takes 50 mg TID)  . NOVOLOG MIX 70/30 FLEXPEN  taking 12 u SQ bid  .  rosuvastatin (CRESTOR) 20 MG tablet Take 20 mg by mouth daily.  Marland Kitchen warfarin (COUMADIN) 7.5 MG tablet Take by mouth.   Allergies  Allergen Reactions  . Minoxidil Other (See Comments)    Pericardial effusion  . Lipitor [Atorvastatin] Other (See Comments)    REACTION: "pain in legs" Tolerates rosuvastatin  . Ace Inhibitors Other (See Comments)    REACTION: "not sure...think it made me drowsy all the time"   PMHx:   Past Medical History  Diagnosis Date  . HLD (hyperlipidemia)     takes Crestor daily  . Vitamin D deficiency   . CVA (cerebral infarction) 1997    no residual deficit  . Anemia   . CKD (chronic kidney disease) stage 4, GFR 15-29 ml/min (HCC)   . Diverticulitis   . History of cardiovascular stress test     a. Myoview Oct 2012 showed EF 49%, no ischemia, LVE  . Pericardial effusion     chronic; felt to be poss related to minoxidil >> DC'd  . S/P cardiac catheterization     a. R/L HC 06/18/15:  mLAD 30%; severe pulmo HTN with PA sat 43%, CI 1.86, prominent V waves indicative of MR; resting hypoxemia O2 sat 86% on RA  . Pulmonary hypertension ([place]) 06/18/2015  . Severe mitral regurgitation   . Chronic diastolic heart failure ([place])   . Stroke ([place]) 1997    no residual effect  .  Heart murmur   . Shortness of breath dyspnea   . Constipation   . Hypertension     takes Hydralazine,Losartan,and Labetalol daily  . DM (diabetes mellitus) ([place])     takes Novolog and Metformin daily  . Chronic diastolic CHF (congestive heart failure) (HCC)     takes Furosemide daily  . Respiratory failure, acute hypoxic, post-operative 07/08/2015    Requiring ECMO support  . S/P minimally invasive mitral valve repair 07/08/2015    Complex valvuloplasty including artificial Gore-tex neochord placement x10 and 26 mm Sorin Memo 3D Rechord ring annuloplasty via right mini thoracotomy approach   Immunization History  Administered Date(s) Administered  . Influenza Split 08/20/2012  . PPD Test  03/25/2014, 03/29/2015  . Pneumococcal-Unspecified 07/28/2008  . Tdap 12/28/2008   Past Surgical History  Procedure Laterality Date  . Cesarean section      x 2  . Tee without cardioversion N/A 06/08/2015    Procedure: TRANSESOPHAGEAL ECHOCARDIOGRAM (TEE);  Surgeon: Lelon Perla, MD;  Location: [facility] ENDOSCOPY;  Service: Cardiovascular;  Laterality: N/A;  . Cardiac catheterization    . Cardiac catheterization N/A 06/18/2015    Procedure: Right/Left Heart Cath and Coronary Angiography;  Surgeon: Jettie Booze, MD;  Location: [place] CV LAB;  Service: Cardiovascular;  Laterality: N/A;  . Colonoscopy    . Multiple extractions with alveoloplasty N/A 06/30/2015    Procedure: MULTIPLE EXTRACTIONS OF TOOTH #'S 4  AND 30 WITH ALVEOLOPLASTY AND GROSS DEBRIDEMENT  OF REMAINING TEETH;  Surgeon: Lenn Cal, DDS;  Location: [place];  Service: Oral Surgery;  Laterality: N/A;  . Mitral valve repair Right 07/08/2015    Procedure: MINIMALLY INVASIVE MITRAL VALVE REPAIR with a 26 Sorin Memo 3D Rechord;  Surgeon: Rexene Alberts, MD;  Location: [place];  Service: Open Heart Surgery;  Laterality: Right;  . Tee without cardioversion N/A 07/08/2015    Procedure: TRANSESOPHAGEAL ECHOCARDIOGRAM (TEE);  Surgeon: Rexene Alberts, MD;  Location: [place];  Service: Open Heart Surgery;  Laterality: N/A;  . Cannulation for cardiopulmonary bypass N/A 07/08/2015    Procedure: CANNULATION FOR ECMO;  Surgeon: Rexene Alberts, MD;  Location: [place];  Service: Open Heart Surgery;  Laterality: N/A;   FHx:    Reviewed / unchanged  SHx:    Reviewed / unchanged  Systems Review:  Constitutional: Denies fever, chills, wt changes, headaches, insomnia, fatigue, night sweats, change in appetite. Eyes: Denies redness, blurred vision, diplopia, discharge, itchy, watery eyes.  ENT: Denies discharge, congestion, post nasal drip, epistaxis, sore throat, earache, hearing loss, dental pain, tinnitus, vertigo, sinus pain, snoring.   CV: Denies chest pain, palpitations, irregular heartbeat, syncope, dyspnea, diaphoresis, orthopnea, PND, claudication or edema. Respiratory: denies cough, dyspnea, DOE, pleurisy, hoarseness, laryngitis, wheezing.  Gastrointestinal: Denies dysphagia, odynophagia, heartburn, reflux, water brash, abdominal pain or cramps, nausea, vomiting, bloating, diarrhea, constipation, hematemesis, melena, hematochezia  or hemorrhoids. Genitourinary: Denies dysuria, frequency, urgency, nocturia, hesitancy, discharge, hematuria or flank pain. Musculoskeletal: Denies arthralgias, myalgias, stiffness, jt. swelling, pain, limping or strain/sprain.  Skin: Denies pruritus, rash, hives, warts, acne, eczema or change in skin lesion(s). Neuro: No weakness, tremor, incoordination, spasms, paresthesia or pain. Psychiatric: Denies confusion, memory loss or sensory loss. Endo: Denies change in weight, skin or hair change.  Heme/Lymph: No excessive bleeding, bruising or enlarged lymph nodes.  Physical Exam  BP 118/74 mmHg  Pulse 72  Temp(Src) 98 F (36.7 C) (Temporal)  Resp 16  Ht 5' (1.524 m)  Wt 160 lb (72.576 kg)  BMI 31.25 kg/m2  Appears well nourished and in no distress. Eyes: PERRLA, EOMs, conjunctiva no swelling or erythema. Sinuses: No frontal/maxillary tenderness ENT/Mouth: EAC's clear, TM's nl w/o erythema, bulging. Nares clear w/o erythema, swelling, exudates. Oropharynx clear without erythema or exudates. Oral hygiene is good. Tongue normal, non obstructing. Hearing intact.  Neck: Supple. Thyroid nl. Car 2+/2+ without bruits, nodes or JVD. Chest: Respirations nl with BS clear & equal w/o rales, rhonchi, wheezing or stridor.  Cor: Heart sounds normal w/ regular rate and rhythm without sig. murmurs, gallops, clicks, or rubs. Peripheral pulses normal and equal  without edema.  Abdomen: Soft & bowel sounds normal. Non-tender w/o guarding, rebound, hernias, masses, or organomegaly.  Lymphatics:  Unremarkable.  Musculoskeletal: Full ROM all peripheral extremities, joint stability, 5/5 strength, and normal gait.  Skin: Warm, dry without exposed rashes, lesions or ecchymosis apparent.  Neuro: Cranial nerves intact, reflexes equal bilaterally. Sensory-motor testing grossly intact. Tendon reflexes grossly intact.  Pysch: Alert & oriented x 3.  Insight and judgement nl & appropriate. No ideations.  Assessment and Plan:  1. Renovascular hypertension  - TSH  2. Hyperlipidemia  - Lipid panel  3. Controlled type 1 diabetes mellitus with diabetic nephropathy (HCC)  - Hemoglobin A1c  4. Vitamin D deficiency  - Vit D  25 hydroxy  5. End stage renal disease ([place])   6. BMI 31.0-31.9,adult   7. Medication management  - CBC with Differential/Platelet - BASIC METABOLIC PANEL WITH GFR - Hepatic function panel - Magnesium  8. CKD (chronic kidney disease) ESRD ([place])   Recommended regular exercise, BP monitoring, weight control, and discussed med and SE's. Recommended labs to assess and monitor clinical status. Further disposition pending results of labs. Over 30 minutes of exam, counseling, chart review was performed

## 2015-10-14 ENCOUNTER — Encounter: Payer: Self-pay | Admitting: Vascular Surgery

## 2015-10-14 ENCOUNTER — Ambulatory Visit (INDEPENDENT_AMBULATORY_CARE_PROVIDER_SITE_OTHER): Payer: BLUE CROSS/BLUE SHIELD | Admitting: Pharmacist Clinician (PhC)/ Clinical Pharmacy Specialist

## 2015-10-14 DIAGNOSIS — Z7901 Long term (current) use of anticoagulants: Secondary | ICD-10-CM | POA: Diagnosis not present

## 2015-10-14 DIAGNOSIS — Z9889 Other specified postprocedural states: Secondary | ICD-10-CM

## 2015-10-14 LAB — POCT INR: INR: 1.5

## 2015-10-18 NOTE — Progress Notes (Signed)
HPI: Follow-up hypertension, diastolic heart failure and previous mitral valve repair. Renal dopplers Nov 2013 showed no RAS. Note previous pericardial effusion with minoxidil.  Transesophageal echocardiogram July 2016 showed normal LV function, moderate to severe left atrial enlargement, small pericardial effusion, flail segment of anterior mitral valve leaflet with severe mitral regurgitation and mild tricuspid regurgitation. Cardiac catheterization September 2016 showed nonobstructive coronary disease and severely elevated pulmonary pressures. Carotid Dopplers August 2016 showed 1-39% bilateral stenosis. Patient had mitral valve repair in August 2016. Patient's course was complicated by acute hypoxic respiratory failure refractory to conventional ventilatory support. She was transferred to [facility] for ECMO. Course was complicated by need for tracheostomy. She also developed acute on chronic renal failure and now requires dialysis. Follow-up echocardiogram at [facility] showed ejection fraction 40%. Note I do not have all records available. Since she was last seen, the patient has dyspnea with more extreme activities but not with routine activities. It is relieved with rest. It is not associated with chest pain. There is no orthopnea, PND or pedal edema. There is no syncope or palpitations. There is no exertional chest pain.   Current Outpatient Prescriptions  Medication Sig Dispense Refill  . aspirin EC 81 MG tablet Take 81 mg by mouth daily.    . busPIRone (BUSPAR) 10 MG tablet Take 10 mg by mouth 2 (two) times daily.     . carvedilol (COREG) 12.5 MG tablet Take 12.5 mg by mouth 2 (two) times daily.     . hydrALAZINE (APRESOLINE) 10 MG tablet Take 5 mg by mouth 3 (three) times daily.   0  . insulin aspart protamine - aspart (NOVOLOG MIX 70/30 FLEXPEN) (70-30) 100 UNIT/ML FlexPen Inject 0.1 mLs (10 Units total) into the skin 2 (two) times daily. (Patient taking differently: Inject 8 Units  into the skin 2 (two) times daily. ) 15 mL 11  . oxyCODONE-acetaminophen (PERCOCET/ROXICET) 5-325 MG tablet Take 1 tablet by mouth every 6 (six) hours as needed. 20 tablet 0  . rosuvastatin (CRESTOR) 20 MG tablet Take 20 mg by mouth daily.    . sertraline (ZOLOFT) 25 MG tablet Take 25 mg by mouth daily.  1  . warfarin (COUMADIN) 7.5 MG tablet Take 7.5-11.25 mg by mouth daily with supper. Take 1 1/2 tablet (11.25 mg) by mouth on Monday and Friday, take 1 tablet (7.5 mg) on Sunday, Tuesday, Wednesday, Thursday, Saturday     No current facility-administered medications for this visit.     Past Medical History  Diagnosis Date  . HLD (hyperlipidemia)     takes Crestor daily  . Vitamin D deficiency   . CVA (cerebral infarction) 1997    no residual deficit  . Anemia   . CKD (chronic kidney disease) stage 4, GFR 15-29 ml/min (HCC)   . Diverticulitis   . History of cardiovascular stress test     a. Myoview Oct 2012 showed EF 49%, no ischemia, LVE  . Pericardial effusion     chronic; felt to be poss related to minoxidil >> DC'd  . S/P cardiac catheterization     a. R/L HC 06/18/15:  mLAD 30%; severe pulmo HTN with PA sat 43%, CI 1.86, prominent V waves indicative of MR; resting hypoxemia O2 sat 86% on RA  . Pulmonary hypertension ([place]) 06/18/2015  . Severe mitral regurgitation   . Chronic diastolic heart failure ([place])   . Stroke ([place]) 1997    no residual effect  . Heart murmur   .  Shortness of breath dyspnea   . Constipation   . Hypertension     takes Hydralazine,Losartan,and Labetalol daily  . DM (diabetes mellitus) ([place])     takes Novolog and Metformin daily  . Chronic diastolic CHF (congestive heart failure) (HCC)     takes Furosemide daily  . Respiratory failure, acute hypoxic, post-operative 07/08/2015    Requiring ECMO support  . S/P minimally invasive mitral valve repair 07/08/2015    Complex valvuloplasty including artificial Gore-tex neochord placement x10 and 26 mm Sorin Memo  3D Rechord ring annuloplasty via right mini thoracotomy approach  . Wears glasses     Past Surgical History  Procedure Laterality Date  . Cesarean section      x 2  . Tee without cardioversion N/A 06/08/2015    Procedure: TRANSESOPHAGEAL ECHOCARDIOGRAM (TEE);  Surgeon: Lelon Perla, MD;  Location: [facility] ENDOSCOPY;  Service: Cardiovascular;  Laterality: N/A;  . Cardiac catheterization    . Cardiac catheterization N/A 06/18/2015    Procedure: Right/Left Heart Cath and Coronary Angiography;  Surgeon: Jettie Booze, MD;  Location: [place] CV LAB;  Service: Cardiovascular;  Laterality: N/A;  . Colonoscopy    . Multiple extractions with alveoloplasty N/A 06/30/2015    Procedure: MULTIPLE EXTRACTIONS OF TOOTH #'S 4  AND 30 WITH ALVEOLOPLASTY AND GROSS DEBRIDEMENT  OF REMAINING TEETH;  Surgeon: Lenn Cal, DDS;  Location: [place];  Service: Oral Surgery;  Laterality: N/A;  . Mitral valve repair Right 07/08/2015    Procedure: MINIMALLY INVASIVE MITRAL VALVE REPAIR with a 26 Sorin Memo 3D Rechord;  Surgeon: Rexene Alberts, MD;  Location: [place];  Service: Open Heart Surgery;  Laterality: Right;  . Tee without cardioversion N/A 07/08/2015    Procedure: TRANSESOPHAGEAL ECHOCARDIOGRAM (TEE);  Surgeon: Rexene Alberts, MD;  Location: [place];  Service: Open Heart Surgery;  Laterality: N/A;  . Cannulation for cardiopulmonary bypass N/A 07/08/2015    Procedure: CANNULATION FOR ECMO;  Surgeon: Rexene Alberts, MD;  Location: [place];  Service: Open Heart Surgery;  Laterality: N/A;    Social History   Social History  . Marital Status: Married    Spouse Name: N/A  . Number of Children: 3  . Years of Education: N/A   Occupational History  .      Unemployed; used to work as a Radio broadcast assistant   Social History Main Topics  . Smoking status: Never Smoker   . Smokeless tobacco: Never Used  . Alcohol Use: No  . Drug Use: No  . Sexual Activity: Yes    Birth Control/ Protection: None    Other Topics Concern  . Not on file   Social History Narrative    ROS: no fevers or chills, productive cough, hemoptysis, dysphasia, odynophagia, melena, hematochezia, dysuria, hematuria, rash, seizure activity, orthopnea, PND, pedal edema, claudication. Remaining systems are negative.  Physical Exam: Well-developed well-nourished in no acute distress.  Skin is warm and dry.  HEENT is normal.  Neck is supple.  Chest is clear to auscultation with normal expansion.  Cardiovascular exam is regular rate and rhythm.  Abdominal exam nontender or distended. No masses palpated. Extremities show no edema. Fistula left upper extremity neuro grossly intact

## 2015-10-19 ENCOUNTER — Encounter (HOSPITAL_COMMUNITY)
Admission: RE | Admit: 2015-10-19 | Discharge: 2015-10-19 | Disposition: A | Discharge status: Home / Self Care | Payer: BLUE CROSS/BLUE SHIELD | Source: Ambulatory Visit | Attending: Cardiology | Admitting: Cardiology

## 2015-10-19 ENCOUNTER — Telehealth: Payer: Self-pay | Admitting: *Deleted

## 2015-10-19 ENCOUNTER — Ambulatory Visit (INDEPENDENT_AMBULATORY_CARE_PROVIDER_SITE_OTHER)
Admission: RE | Admit: 2015-10-19 | Discharge: 2015-10-19 | Disposition: A | Discharge status: Home / Self Care | Payer: BLUE CROSS/BLUE SHIELD | Source: Ambulatory Visit | Attending: Vascular Surgery | Admitting: Vascular Surgery

## 2015-10-19 ENCOUNTER — Encounter: Payer: Self-pay | Admitting: Vascular Surgery

## 2015-10-19 ENCOUNTER — Ambulatory Visit (INDEPENDENT_AMBULATORY_CARE_PROVIDER_SITE_OTHER): Payer: BLUE CROSS/BLUE SHIELD | Admitting: Pharmacist Clinician (PhC)/ Clinical Pharmacy Specialist

## 2015-10-19 ENCOUNTER — Ambulatory Visit (HOSPITAL_COMMUNITY)
Admission: RE | Admit: 2015-10-19 | Discharge: 2015-10-19 | Disposition: A | Discharge status: Home / Self Care | Payer: BLUE CROSS/BLUE SHIELD | Source: Ambulatory Visit | Attending: Vascular Surgery | Admitting: Vascular Surgery

## 2015-10-19 ENCOUNTER — Telehealth: Payer: Self-pay | Admitting: Pharmacist Clinician (PhC)/ Clinical Pharmacy Specialist

## 2015-10-19 ENCOUNTER — Other Ambulatory Visit: Payer: Self-pay

## 2015-10-19 ENCOUNTER — Ambulatory Visit (INDEPENDENT_AMBULATORY_CARE_PROVIDER_SITE_OTHER): Payer: BLUE CROSS/BLUE SHIELD | Admitting: Vascular Surgery

## 2015-10-19 VITALS — BP 124/82 | HR 83 | Ht 59.0 in | Wt 159.7 lb

## 2015-10-19 DIAGNOSIS — N186 End stage renal disease: Secondary | ICD-10-CM

## 2015-10-19 DIAGNOSIS — Z9889 Other specified postprocedural states: Secondary | ICD-10-CM

## 2015-10-19 DIAGNOSIS — Z01810 Encounter for preprocedural cardiovascular examination: Secondary | ICD-10-CM | POA: Insufficient documentation

## 2015-10-19 DIAGNOSIS — Z7901 Long term (current) use of anticoagulants: Secondary | ICD-10-CM | POA: Diagnosis not present

## 2015-10-19 DIAGNOSIS — Z952 Presence of prosthetic heart valve: Secondary | ICD-10-CM | POA: Insufficient documentation

## 2015-10-19 LAB — POCT INR: INR: 1.8

## 2015-10-19 NOTE — Telephone Encounter (Signed)
Forwarded to dr [employer] through [employer]

## 2015-10-19 NOTE — Telephone Encounter (Signed)
Reviewed information with Dr. Stanford Breed.  [place] for patient to hold warfarin until procedure date on Friday Nov 25. No lovenox bridging is needed.  Pt is to restart that evening unless told otherwise by surgeon.  She has a follow up INR and appointment with Dr. Stanford Breed on Monday 11.28

## 2015-10-19 NOTE — Telephone Encounter (Signed)
-----   Message from Lelon Perla, MD sent at 10/19/2015 12:16 PM EST ----- Ok to hold coumadin prior to procedure and resume after; no bridge [name]   ----- Message -----    From: Mal Misty, MD    Sent: 10/19/2015  12:03 PM      To: Lelon Perla, MD

## 2015-10-19 NOTE — Progress Notes (Signed)
Subjective:     Patient ID: Jody Nguyen, female   DOB: 1956-01-19, 59 y.o.   MRN: [id number]  HPI This 59 year old female with end-stage renal disease is evaluated for vascular access. She is right-handed. In August of this year she underwent minimally invasive mitral valve repair by Dr.Owen. Her postoperative course was complicated by a severe right lung injury which required transfer to [facility] for ECMO and tracheostomy. She was hospitalized at [facility] for 2 months and hemodialysis was initiated during this time. Through a tunneled dialysis catheter in the right IJ. She is currently on Coumadin which is about to be discontinued. She is making good recovery of her strength.   Past Medical History  Diagnosis Date  . HLD (hyperlipidemia)     takes Crestor daily  . Vitamin D deficiency   . CVA (cerebral infarction) 1997    no residual deficit  . Anemia   . CKD (chronic kidney disease) stage 4, GFR 15-29 ml/min (HCC)   . Diverticulitis   . History of cardiovascular stress test     a. Myoview Oct 2012 showed EF 49%, no ischemia, LVE  . Pericardial effusion     chronic; felt to be poss related to minoxidil >> DC'd  . S/P cardiac catheterization     a. R/L HC 06/18/15:  mLAD 30%; severe pulmo HTN with PA sat 43%, CI 1.86, prominent V waves indicative of MR; resting hypoxemia O2 sat 86% on RA  . Pulmonary hypertension ([place]) 06/18/2015  . Severe mitral regurgitation   . Chronic diastolic heart failure ([place])   . Stroke ([place]) 1997    no residual effect  . Heart murmur   . Shortness of breath dyspnea   . Constipation   . Hypertension     takes Hydralazine,Losartan,and Labetalol daily  . DM (diabetes mellitus) ([place])     takes Novolog and Metformin daily  . Chronic diastolic CHF (congestive heart failure) (HCC)     takes Furosemide daily  . Respiratory failure, acute hypoxic, post-operative 07/08/2015    Requiring ECMO support  . S/P minimally invasive mitral valve repair 07/08/2015   Complex valvuloplasty including artificial Gore-tex neochord placement x10 and 26 mm Sorin Memo 3D Rechord ring annuloplasty via right mini thoracotomy approach    Social History  Substance Use Topics  . Smoking status: Never Smoker   . Smokeless tobacco: Never Used  . Alcohol Use: No    Family History  Problem Relation Age of Onset  . Heart attack Brother     MI in his 17s  . Heart disease Brother     before age 27  . Cancer Sister     breast cancer  . Breast cancer Sister   . Stroke Mother     Allergies  Allergen Reactions  . Minoxidil Other (See Comments)    Pericardial effusion  . Lipitor [Atorvastatin] Other (See Comments)    REACTION: "pain in legs" Tolerates rosuvastatin  . Ace Inhibitors Other (See Comments)    REACTION: "not sure...think it made me drowsy all the time"     Current outpatient prescriptions:  .  aspirin 81 MG tablet, Take 81 mg by mouth daily.  , Disp: , Rfl:  .  busPIRone (BUSPAR) 10 MG tablet, Take 1 tablet by mouth 2 (two) times daily., Disp: , Rfl:  .  carvedilol (COREG) 12.5 MG tablet, Take 1 tablet by mouth 2 (two) times daily., Disp: , Rfl:  .  Cholecalciferol (VITAMIN D-3) 5000 UNITS TABS, Take  5,000 Units by mouth daily. , Disp: , Rfl:  .  hydrALAZINE (APRESOLINE) 100 MG tablet, Take 1 tablet (100 mg total) by mouth every 8 (eight) hours. (Patient taking differently: Take 50 mg by mouth every 8 (eight) hours. Takes 50 mg (1/2 tablet) in the morning and 100 mg (1 tablet) at night), Disp: 90 tablet, Rfl: 0 .  insulin aspart protamine - aspart (NOVOLOG MIX 70/30 FLEXPEN) (70-30) 100 UNIT/ML FlexPen, Inject 0.1 mLs (10 Units total) into the skin 2 (two) times daily. (Patient taking differently: Inject 8 Units into the skin 2 (two) times daily. ), Disp: 15 mL, Rfl: 11 .  Insulin Pen Needle (FIFTY50 PEN NEEDLES) 31G X 5 MM MISC, Use as directed., Disp: , Rfl:  .  Lancet Devices ([employer] LANCING DEVICE) MISC, Use 1 each 3 (three) times daily. Product  selection permitted according to insurance preference. E11.9 Type 2 diabetes mellitus, Disp: , Rfl:  .  rosuvastatin (CRESTOR) 20 MG tablet, Take 20 mg by mouth daily., Disp: , Rfl:  .  warfarin (COUMADIN) 7.5 MG tablet, Take by mouth., Disp: , Rfl:   Filed Vitals:   10/19/15 1121  BP: 124/82  Pulse: 83  Height: 4\' 11"  (1.499 m)  Weight: 159 lb 11.2 oz (72.439 kg)  SpO2: 97%    Body mass index is 32.24 kg/(m^2).           Review of Systems Denies chest pain but does have mild dyspnea on exertion. Increasing ambulation. Is on chronic anticoagulation-Coumadin because of mitral valve repair to be discontinued  soon. Has hemodialysis Monday Wednesday Friday. Has history of CVA with no residual deficit. History of chronic hypertension. Other systems negative and complete review of systems     Objective:   Physical Exam BP 124/82 mmHg  Pulse 83  Ht 4\' 11"  (1.499 m)  Wt 159 lb 11.2 oz (72.439 kg)  BMI 32.24 kg/m2  SpO2 97%  Gen.-alert and oriented x3 in no apparent distress HEENT normal for age Lungs no rhonchi or wheezing Cardiovascular regular rhythm no murmurs carotid pulses 3+ palpable no bruits audible Abdomen soft nontender no palpable masses Musculoskeletal free of  major deformities Skin clear -no rashes Neurologic normal Lower extremities 3+ femoral and dorsalis pedis pulses palpable bilaterally with  1+ edema Upper extremities with 3+ brachial 2+ radial pulse palpable. No subsurface veins are visible on physical exam.  Today I ordered upper extremity arterial and venous study -vein mapping. This reveals that the left cephalic vein appears to be adequate size as does the left basilic vein for fistula creation. Arterial study is normal.       Assessment:      end-stage renal disease-on hemodialysis Monday Wednesday Friday Recent minimally invasive mitral valve repair with severe right lung injury requiring  Transferred to [facility] for 2 month  hospitalization   on chronic hemodialysis-Coumadin about to be discontinued     Plan:      plan left brachial cephalic versus radial cephalic AV fistula on Friday November 25   Will contact Dr. Jacalyn Lefevre office regarding discontinuing Coumadin and need for Lovenox bridge  have discussed with patient and her husband the fact that second operation will certainly be required to bring fistula more closely to the surface if it is successful  We'll proceed on Friday

## 2015-10-19 NOTE — Progress Notes (Signed)
Cardiac Rehab Medication Review by a Pharmacist  Does the patient  feel that his/her medications are working for him/her?  yes  Has the patient been experiencing any side effects to the medications prescribed?  yes  Does the patient measure his/her own blood pressure or blood glucose at home?  yes   Does the patient have any problems obtaining medications due to transportation or finances?   no  Understanding of regimen: good Understanding of indications: excellent Potential of compliance: excellent    Pharmacist comments: Mrs. Mirante describes a "burning feeling" on her tongue when she takes her medications. She says it usually gets better as the day goes on, but sometimes comes back when she eats. I recommended she speak with her PCP about this if it becomes bothersome.   Mrs. Stoessel has a strong social support system from her husband and has no problems with staying compliant with her medications. She understands her regimen well and the indications for her medications. I answered her questions and updated her medication history appropriately.  Governor Specking, PharmD Clinical Pharmacy Resident Pager: [phone] 10/19/2015 8:28 AM

## 2015-10-20 ENCOUNTER — Encounter (HOSPITAL_COMMUNITY): Payer: Self-pay | Admitting: *Deleted

## 2015-10-20 MED ORDER — CEFUROXIME SODIUM 1.5 G IJ SOLR
1.5000 g | INTRAMUSCULAR | Status: AC
  Administered 2015-10-22: 1.5 g via INTRAVENOUS
  Filled 2015-10-20: qty 1.5

## 2015-10-20 MED ORDER — CHLORHEXIDINE GLUCONATE CLOTH 2 % EX PADS: 6.0000 | MEDICATED_PAD | Freq: Once | CUTANEOUS | Status: DC

## 2015-10-20 MED ORDER — SODIUM CHLORIDE 0.9 % IV SOLN
INTRAVENOUS | Status: DC
  Administered 2015-10-22: 06:00:00 via INTRAVENOUS

## 2015-10-20 NOTE — Progress Notes (Addendum)
Anesthesia Chart Review: SAME DAY WORK-UP.  Patient is a 59 year old female scheduled for left radiocephalic versus brachiocephalic AVF creation on [id number] by Dr. Trula Slade. Procedure is posted for MAC anesthesia. She is s/p MV repair [id number] that was complicated by acute hypoxic respiratory failure refractory to conventional ventilatory support and required veno-venous ECMO using a conventional heart-lung machine and was transported to [facility]. According to [facility] records in [place], she had decannulation of ECMO on 07/08/15, re-cannulation of ECMO on 07/17/15, tracheostomy on 07/21/15 (decannulated 08/31/15), decannulation of ECMO on 07/29/15, pericardial window on 08/09/15, PAF (discharged home on warfarin), and acute on chronic renal failure requiring initiation of hemodialysis via tunneled right IR catheter. She was hospitalized for approximately two months (discharged 09/04/15).   Other history includes non-smoker, severe mitral regurgitation s/p minimally invasive MV repair 07/08/15 requiring post-operative ECMO and post-operative tracheostomy and pericardial window, chronic diastolic CHF, pulmonary hypertension, SOB, HTN, HLD, CVA '97, DM2, CKD (started HD 06/2015), anemia, pericardial effusion (thought possible due to minoxidil). BMI is consistent with obesity.   PCP is Dr. Eddie North. Cardiologist is Dr. Stanford Breed.CT surgeon is Dr. Roxy Manns. Both cardiology and CT surgery are aware of HD access surgery plans.  Meds include ASA 81 mg, Buspar, Coreg, hydralazine, Novolog 70/30, Crestor, warfarin. Dr. Stanford Breed has given permission to hold warfarin pre-operatively without Lovenox bridge.  09/10/15 EKG: SR, first degree AVB, PVCs, non-specific T wave changes.  06/18/15 PRE-MV Repair Cardiac cath:  Nonobstructive coronary artery disease (30% mid LAD).  Severe pulmonary artery hypertension. PA saturation 43%. Cardiac index 1.86. Prominent V waves indicative of mitral regurgitation.  Resting hypoxemia with  oxygen saturation 86% on room air.  Systemic hypertension  07/08/15 INTRA-OP TEE (by Operative Report): "Followup transesophageal echocardiogram performed after separation from bypass revealed a well-seated annuloplasty ring in the mitral position with a normal functioning mitral valve. There was no residual leak. Left ventricular function was unchanged from preoperatively. The mean gradient across the mitral valve was estimated to be 5 mmHg." (06/08/15 PRE-OP TEE showed EF 55-60%, LVH, mod-severe LAE, mildly reduced RV function, severe MR.) Per 10/18/15 note by Dr. Stanford Breed, "Follow-up echocardiogram at [facility] showed ejection fraction 40%. Note I do not have all records available." I can not access any echo report from [facility] in [place], and they will not send records without a signed release.  07/06/15 Carotid duplex:  - The vertebral arteries appear patent with antegrade flow. - Findings consistent with 1- 39 percent stenosis involving the right internal carotid artery and the left internal carotid artery. - ICA/CCA ratio. right = 0.97. left = 0.92.  10/05/15 CXR (Care Everywhere): IMPRESSION: 1. Dialysis catheter noted with its tip projected at the cavoatrial junction. Interval removal of endotracheal tube, Swan-Ganz catheter, right chest tube, mediastinal drainage tube. No pneumothorax. 2. Prior cardiac valve replacement. Stable cardiomegaly. No pulmonary venous congestion. 3. Bibasilar mild atelectasis.  07/06/15 PFTs: FVC 1.19 (53%), FEV1 1.12 (64%), DLCOunc 10.06 (53%).  She is for labs on the day of surgery.  George Hugh [facility] Short Stay Center/Anesthesiology Phone [phone] 10/20/2015 10:40 AM

## 2015-10-20 NOTE — Progress Notes (Signed)
Pt denies SOB and chest pain but is under the care of Dr. Stanford Breed, cardiology. Pt stated that her last dose of Coumadin was Monday, 10/18/15. Pt made aware to stop taking otc vitamins, herbal medications, fish oil, and NSAID's. Pt verbalized understanding of all pre-op instructions. Pt chart forwarded to [place], [place], anesthesia, for review of cardiac history; see note.

## 2015-10-21 NOTE — Anesthesia Preprocedure Evaluation (Addendum)
Anesthesia Evaluation  Patient identified by MRN, date of birth, ID band Patient awake    Reviewed: Allergy & Precautions, Patient's Chart, lab work & pertinent test results  History of Anesthesia Complications Negative for: history of anesthetic complications  Airway Mallampati: II  TM Distance: >3 FB Neck ROM: Full    Dental  (+) Teeth Intact, Dental Advisory Given   Pulmonary neg pulmonary ROS,    Pulmonary exam normal        Cardiovascular hypertension, Pt. on medications and Pt. on home beta blockers +CHF  Normal cardiovascular exam  a. R/L HC 06/18/15: mLAD 30%; severe pulmo HTN with PA sat 43%, CI 1.86, prominent V waves indicative of MR; resting hypoxemia O2 sat 86% on RA   Neuro/Psych CVA negative psych ROS   GI/Hepatic negative GI ROS, Neg liver ROS,   Endo/Other  diabetes  Renal/GU ESRF and DialysisRenal disease     Musculoskeletal   Abdominal   Peds  Hematology   Anesthesia Other Findings   Reproductive/Obstetrics                            Anesthesia Physical Anesthesia Plan  ASA: III  Anesthesia Plan: MAC   Post-op Pain Management:    Induction: Intravenous  Airway Management Planned: Simple Face Mask  Additional Equipment:   Intra-op Plan:   Post-operative Plan:   Informed Consent: I have reviewed the patients History and Physical, chart, labs and discussed the procedure including the risks, benefits and alternatives for the proposed anesthesia with the patient or authorized representative who has indicated his/her understanding and acceptance.   Dental advisory given  Plan Discussed with: CRNA, Anesthesiologist and Surgeon  Anesthesia Plan Comments:        Anesthesia Quick Evaluation

## 2015-10-22 ENCOUNTER — Other Ambulatory Visit: Payer: Self-pay | Admitting: *Deleted

## 2015-10-22 ENCOUNTER — Encounter (HOSPITAL_COMMUNITY)
Admission: RE | Disposition: A | Discharge status: Home / Self Care | Payer: Self-pay | Source: Ambulatory Visit | Attending: Vascular Surgery

## 2015-10-22 ENCOUNTER — Ambulatory Visit (HOSPITAL_COMMUNITY): Payer: BLUE CROSS/BLUE SHIELD | Admitting: Vascular Surgery

## 2015-10-22 ENCOUNTER — Encounter (HOSPITAL_COMMUNITY): Payer: Self-pay | Admitting: *Deleted

## 2015-10-22 ENCOUNTER — Ambulatory Visit (HOSPITAL_COMMUNITY)
Admission: RE | Admit: 2015-10-22 | Discharge: 2015-10-22 | Disposition: A | Discharge status: Home / Self Care | Payer: BLUE CROSS/BLUE SHIELD | Source: Ambulatory Visit | Attending: Vascular Surgery | Admitting: Vascular Surgery

## 2015-10-22 DIAGNOSIS — Z888 Allergy status to other drugs, medicaments and biological substances status: Secondary | ICD-10-CM | POA: Insufficient documentation

## 2015-10-22 DIAGNOSIS — Z7982 Long term (current) use of aspirin: Secondary | ICD-10-CM | POA: Diagnosis not present

## 2015-10-22 DIAGNOSIS — Z794 Long term (current) use of insulin: Secondary | ICD-10-CM | POA: Insufficient documentation

## 2015-10-22 DIAGNOSIS — I5032 Chronic diastolic (congestive) heart failure: Secondary | ICD-10-CM | POA: Insufficient documentation

## 2015-10-22 DIAGNOSIS — E119 Type 2 diabetes mellitus without complications: Secondary | ICD-10-CM | POA: Insufficient documentation

## 2015-10-22 DIAGNOSIS — N186 End stage renal disease: Secondary | ICD-10-CM

## 2015-10-22 DIAGNOSIS — I340 Nonrheumatic mitral (valve) insufficiency: Secondary | ICD-10-CM | POA: Diagnosis not present

## 2015-10-22 DIAGNOSIS — I120 Hypertensive chronic kidney disease with stage 5 chronic kidney disease or end stage renal disease: Secondary | ICD-10-CM | POA: Insufficient documentation

## 2015-10-22 DIAGNOSIS — Z79899 Other long term (current) drug therapy: Secondary | ICD-10-CM | POA: Diagnosis not present

## 2015-10-22 DIAGNOSIS — Z8673 Personal history of transient ischemic attack (TIA), and cerebral infarction without residual deficits: Secondary | ICD-10-CM | POA: Insufficient documentation

## 2015-10-22 DIAGNOSIS — N185 Chronic kidney disease, stage 5: Secondary | ICD-10-CM | POA: Diagnosis not present

## 2015-10-22 DIAGNOSIS — I272 Other secondary pulmonary hypertension: Secondary | ICD-10-CM | POA: Diagnosis not present

## 2015-10-22 DIAGNOSIS — R011 Cardiac murmur, unspecified: Secondary | ICD-10-CM | POA: Diagnosis not present

## 2015-10-22 DIAGNOSIS — Z7901 Long term (current) use of anticoagulants: Secondary | ICD-10-CM | POA: Insufficient documentation

## 2015-10-22 DIAGNOSIS — E785 Hyperlipidemia, unspecified: Secondary | ICD-10-CM | POA: Insufficient documentation

## 2015-10-22 DIAGNOSIS — Z4931 Encounter for adequacy testing for hemodialysis: Secondary | ICD-10-CM

## 2015-10-22 DIAGNOSIS — Z992 Dependence on renal dialysis: Secondary | ICD-10-CM | POA: Insufficient documentation

## 2015-10-22 HISTORY — PX: AV FISTULA PLACEMENT: SHX1204

## 2015-10-22 HISTORY — DX: Presence of spectacles and contact lenses: Z97.3

## 2015-10-22 LAB — GLUCOSE, CAPILLARY: Glucose-Capillary: 94 mg/dL (ref 65–99)

## 2015-10-22 LAB — PROTIME-INR
INR: 1.25 (ref 0.00–1.49)
Prothrombin Time: 15.9 seconds — ABNORMAL HIGH (ref 11.6–15.2)

## 2015-10-22 LAB — POCT I-STAT 4, (NA,K, GLUC, HGB,HCT)
Glucose, Bld: 97 mg/dL (ref 65–99)
HCT: 38 % (ref 36.0–46.0)
Hemoglobin: 12.9 g/dL (ref 12.0–15.0)
Potassium: 4.3 mmol/L (ref 3.5–5.1)
Sodium: 136 mmol/L (ref 135–145)

## 2015-10-22 LAB — APTT: aPTT: 29 seconds (ref 24–37)

## 2015-10-22 SURGERY — ARTERIOVENOUS (AV) FISTULA CREATION
Anesthesia: Monitor Anesthesia Care | Site: Arm Lower | Laterality: Left

## 2015-10-22 MED ORDER — MIDAZOLAM HCL 5 MG/5ML IJ SOLN
INTRAMUSCULAR | Status: DC | PRN
  Administered 2015-10-22: 2 mg via INTRAVENOUS

## 2015-10-22 MED ORDER — 0.9 % SODIUM CHLORIDE (POUR BTL) OPTIME
TOPICAL | Status: DC | PRN
  Administered 2015-10-22: 1000 mL

## 2015-10-22 MED ORDER — THROMBIN 20000 UNITS EX SOLR
CUTANEOUS | Status: AC
  Filled 2015-10-22: qty 20000

## 2015-10-22 MED ORDER — PROMETHAZINE HCL 25 MG/ML IJ SOLN: 6.2500 mg | INTRAMUSCULAR | Status: DC | PRN

## 2015-10-22 MED ORDER — HEMOSTATIC AGENTS (NO CHARGE) OPTIME
TOPICAL | Status: DC | PRN
  Administered 2015-10-22: 1 via TOPICAL

## 2015-10-22 MED ORDER — HYDROMORPHONE HCL 1 MG/ML IJ SOLN: 0.2500 mg | INTRAMUSCULAR | Status: DC | PRN

## 2015-10-22 MED ORDER — MIDAZOLAM HCL 2 MG/2ML IJ SOLN
INTRAMUSCULAR | Status: AC
  Filled 2015-10-22: qty 2

## 2015-10-22 MED ORDER — LIDOCAINE-EPINEPHRINE (PF) 1 %-1:200000 IJ SOLN
INTRAMUSCULAR | Status: DC | PRN
  Administered 2015-10-22: 14 mL

## 2015-10-22 MED ORDER — PROPOFOL 10 MG/ML IV BOLUS
INTRAVENOUS | Status: AC
  Filled 2015-10-22: qty 20

## 2015-10-22 MED ORDER — LIDOCAINE-EPINEPHRINE (PF) 1 %-1:200000 IJ SOLN
INTRAMUSCULAR | Status: AC
  Filled 2015-10-22: qty 30

## 2015-10-22 MED ORDER — FENTANYL CITRATE (PF) 250 MCG/5ML IJ SOLN
INTRAMUSCULAR | Status: AC
  Filled 2015-10-22: qty 5

## 2015-10-22 MED ORDER — SODIUM CHLORIDE 0.9 % IV SOLN
INTRAVENOUS | Status: DC | PRN
  Administered 2015-10-22: 500 mL

## 2015-10-22 MED ORDER — HEPARIN SODIUM (PORCINE) 1000 UNIT/ML IJ SOLN
INTRAMUSCULAR | Status: DC | PRN
  Administered 2015-10-22: 3000 [IU] via INTRAVENOUS

## 2015-10-22 MED ORDER — PROPOFOL 500 MG/50ML IV EMUL
INTRAVENOUS | Status: DC | PRN
  Administered 2015-10-22: 25 ug/kg/min via INTRAVENOUS

## 2015-10-22 MED ORDER — OXYCODONE-ACETAMINOPHEN 5-325 MG PO TABS: 1.0000 | ORAL_TABLET | Freq: Four times a day (QID) | ORAL | Status: DC | PRN

## 2015-10-22 MED ORDER — PROTAMINE SULFATE 10 MG/ML IV SOLN
INTRAVENOUS | Status: DC | PRN
  Administered 2015-10-22: 25 mg via INTRAVENOUS

## 2015-10-22 MED ORDER — LIDOCAINE HCL (PF) 1 % IJ SOLN
INTRAMUSCULAR | Status: AC
  Filled 2015-10-22: qty 30

## 2015-10-22 MED ORDER — FENTANYL CITRATE (PF) 100 MCG/2ML IJ SOLN
INTRAMUSCULAR | Status: DC | PRN
  Administered 2015-10-22 (×4): 25 ug via INTRAVENOUS

## 2015-10-22 SURGICAL SUPPLY — 29 items
ARMBAND PINK RESTRICT EXTREMIT (MISCELLANEOUS) ×2 IMPLANT
CANISTER SUCTION 2500CC (MISCELLANEOUS) ×2 IMPLANT
CLIP TI MEDIUM 6 (CLIP) ×2 IMPLANT
CLIP TI WIDE RED SMALL 6 (CLIP) ×2 IMPLANT
COVER PROBE W GEL 5X96 (DRAPES) ×2 IMPLANT
ELECT REM PT RETURN 9FT ADLT (ELECTROSURGICAL) ×2
ELECTRODE REM PT RTRN 9FT ADLT (ELECTROSURGICAL) ×1 IMPLANT
GLOVE BIOGEL PI IND STRL 7.5 (GLOVE) ×2 IMPLANT
GLOVE BIOGEL PI INDICATOR 7.5 (GLOVE) ×2
GLOVE ECLIPSE 7.0 STRL STRAW (GLOVE) ×2 IMPLANT
GLOVE SURG SS PI 7.5 STRL IVOR (GLOVE) ×2 IMPLANT
GOWN STRL REUS W/ TWL LRG LVL3 (GOWN DISPOSABLE) ×2 IMPLANT
GOWN STRL REUS W/ TWL XL LVL3 (GOWN DISPOSABLE) ×1 IMPLANT
GOWN STRL REUS W/TWL LRG LVL3 (GOWN DISPOSABLE) ×2
GOWN STRL REUS W/TWL XL LVL3 (GOWN DISPOSABLE) ×1
HEMOSTAT SNOW SURGICEL 2X4 (HEMOSTASIS) ×2 IMPLANT
KIT BASIN OR (CUSTOM PROCEDURE TRAY) ×2 IMPLANT
KIT ROOM TURNOVER OR (KITS) ×2 IMPLANT
LIQUID BAND (GAUZE/BANDAGES/DRESSINGS) ×2 IMPLANT
NS IRRIG 1000ML POUR BTL (IV SOLUTION) ×2 IMPLANT
PACK CV ACCESS (CUSTOM PROCEDURE TRAY) ×2 IMPLANT
PAD ARMBOARD 7.5X6 YLW CONV (MISCELLANEOUS) ×4 IMPLANT
SUT PROLENE 6 0 CC (SUTURE) ×2 IMPLANT
SUT VIC AB 3-0 SH 27 (SUTURE) ×1
SUT VIC AB 3-0 SH 27X BRD (SUTURE) ×1 IMPLANT
SUT VICRYL 4-0 PS2 18IN ABS (SUTURE) IMPLANT
TOWEL OR 17X26 10 PK STRL BLUE (TOWEL DISPOSABLE) ×2 IMPLANT
UNDERPAD 30X30 INCONTINENT (UNDERPADS AND DIAPERS) ×2 IMPLANT
WATER STERILE IRR 1000ML POUR (IV SOLUTION) ×2 IMPLANT

## 2015-10-22 NOTE — Anesthesia Postprocedure Evaluation (Signed)
Anesthesia Post Note  Patient: Jody Nguyen  Procedure(s) Performed: Procedure(s) (LRB): RADIOCEPHALIC ARTERIOVENOUS (AV) FISTULA CREATION (Left)  Patient location during evaluation: PACU Anesthesia Type: MAC Level of consciousness: awake and alert Pain management: pain level controlled Vital Signs Assessment: post-procedure vital signs reviewed and stable Respiratory status: spontaneous breathing, nonlabored ventilation, respiratory function stable and patient connected to nasal cannula oxygen Cardiovascular status: stable and blood pressure returned to baseline Anesthetic complications: no    Last Vitals:  Filed Vitals:   10/22/15 0945 10/22/15 1000  BP: 135/87 145/95  Pulse: 80 80  Temp: 36.6 C   Resp: 18 18    Last Pain: There were no vitals filed for this visit.               Waver Dibiasio DANIEL

## 2015-10-22 NOTE — H&P (View-Only) (Signed)
Subjective:     Patient ID: Jody Nguyen, female   DOB: 19-Feb-1956, 59 y.o.   MRN: [id number]  HPI This 59 year old female with end-stage renal disease is evaluated for vascular access. She is right-handed. In August of this year she underwent minimally invasive mitral valve repair by Dr.Owen. Her postoperative course was complicated by a severe right lung injury which required transfer to [facility] for ECMO and tracheostomy. She was hospitalized at [facility] for 2 months and hemodialysis was initiated during this time. Through a tunneled dialysis catheter in the right IJ. She is currently on Coumadin which is about to be discontinued. She is making good recovery of her strength.   Past Medical History  Diagnosis Date  . HLD (hyperlipidemia)     takes Crestor daily  . Vitamin D deficiency   . CVA (cerebral infarction) 1997    no residual deficit  . Anemia   . CKD (chronic kidney disease) stage 4, GFR 15-29 ml/min (HCC)   . Diverticulitis   . History of cardiovascular stress test     a. Myoview Oct 2012 showed EF 49%, no ischemia, LVE  . Pericardial effusion     chronic; felt to be poss related to minoxidil >> DC'd  . S/P cardiac catheterization     a. R/L HC 06/18/15:  mLAD 30%; severe pulmo HTN with PA sat 43%, CI 1.86, prominent V waves indicative of MR; resting hypoxemia O2 sat 86% on RA  . Pulmonary hypertension ([place]) 06/18/2015  . Severe mitral regurgitation   . Chronic diastolic heart failure ([place])   . Stroke ([place]) 1997    no residual effect  . Heart murmur   . Shortness of breath dyspnea   . Constipation   . Hypertension     takes Hydralazine,Losartan,and Labetalol daily  . DM (diabetes mellitus) ([place])     takes Novolog and Metformin daily  . Chronic diastolic CHF (congestive heart failure) (HCC)     takes Furosemide daily  . Respiratory failure, acute hypoxic, post-operative 07/08/2015    Requiring ECMO support  . S/P minimally invasive mitral valve repair 07/08/2015   Complex valvuloplasty including artificial Gore-tex neochord placement x10 and 26 mm Sorin Memo 3D Rechord ring annuloplasty via right mini thoracotomy approach    Social History  Substance Use Topics  . Smoking status: Never Smoker   . Smokeless tobacco: Never Used  . Alcohol Use: No    Family History  Problem Relation Age of Onset  . Heart attack Brother     MI in his 26s  . Heart disease Brother     before age 25  . Cancer Sister     breast cancer  . Breast cancer Sister   . Stroke Mother     Allergies  Allergen Reactions  . Minoxidil Other (See Comments)    Pericardial effusion  . Lipitor [Atorvastatin] Other (See Comments)    REACTION: "pain in legs" Tolerates rosuvastatin  . Ace Inhibitors Other (See Comments)    REACTION: "not sure...think it made me drowsy all the time"     Current outpatient prescriptions:  .  aspirin 81 MG tablet, Take 81 mg by mouth daily.  , Disp: , Rfl:  .  busPIRone (BUSPAR) 10 MG tablet, Take 1 tablet by mouth 2 (two) times daily., Disp: , Rfl:  .  carvedilol (COREG) 12.5 MG tablet, Take 1 tablet by mouth 2 (two) times daily., Disp: , Rfl:  .  Cholecalciferol (VITAMIN D-3) 5000 UNITS TABS, Take  5,000 Units by mouth daily. , Disp: , Rfl:  .  hydrALAZINE (APRESOLINE) 100 MG tablet, Take 1 tablet (100 mg total) by mouth every 8 (eight) hours. (Patient taking differently: Take 50 mg by mouth every 8 (eight) hours. Takes 50 mg (1/2 tablet) in the morning and 100 mg (1 tablet) at night), Disp: 90 tablet, Rfl: 0 .  insulin aspart protamine - aspart (NOVOLOG MIX 70/30 FLEXPEN) (70-30) 100 UNIT/ML FlexPen, Inject 0.1 mLs (10 Units total) into the skin 2 (two) times daily. (Patient taking differently: Inject 8 Units into the skin 2 (two) times daily. ), Disp: 15 mL, Rfl: 11 .  Insulin Pen Needle (FIFTY50 PEN NEEDLES) 31G X 5 MM MISC, Use as directed., Disp: , Rfl:  .  Lancet Devices ([employer] LANCING DEVICE) MISC, Use 1 each 3 (three) times daily. Product  selection permitted according to insurance preference. E11.9 Type 2 diabetes mellitus, Disp: , Rfl:  .  rosuvastatin (CRESTOR) 20 MG tablet, Take 20 mg by mouth daily., Disp: , Rfl:  .  warfarin (COUMADIN) 7.5 MG tablet, Take by mouth., Disp: , Rfl:   Filed Vitals:   10/19/15 1121  BP: 124/82  Pulse: 83  Height: 4\' 11"  (1.499 m)  Weight: 159 lb 11.2 oz (72.439 kg)  SpO2: 97%    Body mass index is 32.24 kg/(m^2).           Review of Systems Denies chest pain but does have mild dyspnea on exertion. Increasing ambulation. Is on chronic anticoagulation-Coumadin because of mitral valve repair to be discontinued  soon. Has hemodialysis Monday Wednesday Friday. Has history of CVA with no residual deficit. History of chronic hypertension. Other systems negative and complete review of systems     Objective:   Physical Exam BP 124/82 mmHg  Pulse 83  Ht 4\' 11"  (1.499 m)  Wt 159 lb 11.2 oz (72.439 kg)  BMI 32.24 kg/m2  SpO2 97%  Gen.-alert and oriented x3 in no apparent distress HEENT normal for age Lungs no rhonchi or wheezing Cardiovascular regular rhythm no murmurs carotid pulses 3+ palpable no bruits audible Abdomen soft nontender no palpable masses Musculoskeletal free of  major deformities Skin clear -no rashes Neurologic normal Lower extremities 3+ femoral and dorsalis pedis pulses palpable bilaterally with  1+ edema Upper extremities with 3+ brachial 2+ radial pulse palpable. No subsurface veins are visible on physical exam.  Today I ordered upper extremity arterial and venous study -vein mapping. This reveals that the left cephalic vein appears to be adequate size as does the left basilic vein for fistula creation. Arterial study is normal.       Assessment:      end-stage renal disease-on hemodialysis Monday Wednesday Friday Recent minimally invasive mitral valve repair with severe right lung injury requiring  Transferred to [facility] for [facility] month  hospitalization   on chronic hemodialysis-Coumadin about to be discontinued     Plan:      plan left brachial cephalic versus radial cephalic AV fistula on Friday November 25   Will contact Dr. Jacalyn Lefevre office regarding discontinuing Coumadin and need for Lovenox bridge  have discussed with patient and her husband the fact that second operation will certainly be required to bring fistula more closely to the surface if it is successful  We'll proceed on Friday

## 2015-10-22 NOTE — Transfer of Care (Signed)
Immediate Anesthesia Transfer of Care Note  Patient: Jody Nguyen  Procedure(s) Performed: Procedure(s): RADIOCEPHALIC ARTERIOVENOUS (AV) FISTULA CREATION (Left)  Patient Location: PACU  Anesthesia Type:MAC  Level of Consciousness: awake, alert , oriented and patient cooperative  Airway & Oxygen Therapy: Patient Spontanous Breathing and Patient connected to face mask oxygen  Post-op Assessment: Report given to RN, Post -op Vital signs reviewed and stable, Patient moving all extremities and Patient moving all extremities X 4  Post vital signs: Reviewed and stable  Last Vitals:  Filed Vitals:   10/22/15 0605  BP: 142/81  Pulse: 83  Temp: 37.1 C  Resp: 16    Complications: No apparent anesthesia complications

## 2015-10-22 NOTE — Op Note (Signed)
    Patient name: Jody Nguyen MRN: [id number] DOB: 08/28/1956 Sex: female  10/22/2015 Pre-operative Diagnosis: ESRD Post-operative diagnosis:  Same Surgeon:  [name] Assistants:  Lennie Muckle Procedure:   Left radio-cephalic fistula Anesthesia:  MAC Blood Loss:  See anesthesia record Specimens:  none  Findings:  28mm disease free artery.  2.5-1mm vein  Indications:  The patient is not yet on dialysis.  She is right-handed.  She had an adequate left cephalic vein and comes in today for fistula creation.  Procedure:  The patient was identified in the holding area and taken to [place] [facility]  The patient was then placed supine on the table. MAC anesthesia was administered.  The patient was prepped and draped in the usual sterile fashion.  A time out was called and antibiotics were administered.  One percent lidocaine was used for local anesthesia.  The cephalic vein was imaged with ultrasound and found to be of adequate diameter and easily compressible.  A longitudinal incision was made just proximal to the wrist, avoiding previous A-line cannulation sites.  The cephalic vein was dissected free.  This is approximately a 2. 5-3 millimeter vein.  It was fully mobilized throughout the incision and then marked with an ink pen for orientation.  Next the radial artery was dissected free.  This was 2 mm and disease free.  It was encircled with Vesseloops.  3000 units of heparin were given.  The vein was ligated distally and distended with heparinized saline.  It distended nicely.  The artery was then occluded with Serafin clamps.  A #11 blade was used to make an arteriotomy which was extended with Potts scissors.  The vein was cut the appropriate length and then spatulated.  A running end-to-side anastomosis was created with 6-0 proline.  Prior to completion, the appropriate flushing maneuvers were performed the anastomosis was completed.  I then inspected the vein and made sure there were no kinks.  I  ligated all side branches within the incision.  There was an excellent thrill within the fistula and good Doppler blood flow to the hand.  0.5 mg of protamine was given.  The incision was closed with 2 layers of 3-0 Vicryl followed by Dermabond.   Disposition:  To PACU in stable condition.   Theotis Burrow, M.D. [facility] Office: [phone] Pager:  [phone]

## 2015-10-22 NOTE — Interval H&P Note (Signed)
History and Physical Interval Note:  10/22/2015 8:03 AM  Jody Nguyen  has presented today for surgery, with the diagnosis of End Stage Renal Disease N18.6  The various methods of treatment have been discussed with the patient and family. After consideration of risks, benefits and other options for treatment, the patient has consented to  Procedure(s): RADIOCEPHALIC VERSUS BRACHIOCEPHALIC ARTERIOVENOUS (AV) FISTULA CREATION (Left) as a surgical intervention .  The patient's history has been reviewed, patient examined, no change in status, stable for surgery.  I have reviewed the patient's chart and labs.  Questions were answered to the patient's satisfaction.     [name]  Plan for left arm fistula.  May need additional proceedures for maturation  Wells Brabham

## 2015-10-25 ENCOUNTER — Encounter: Payer: Self-pay | Admitting: Cardiology

## 2015-10-25 ENCOUNTER — Ambulatory Visit (INDEPENDENT_AMBULATORY_CARE_PROVIDER_SITE_OTHER): Payer: BLUE CROSS/BLUE SHIELD | Admitting: Cardiology

## 2015-10-25 ENCOUNTER — Telehealth: Payer: Self-pay | Admitting: Surgery

## 2015-10-25 ENCOUNTER — Ambulatory Visit (INDEPENDENT_AMBULATORY_CARE_PROVIDER_SITE_OTHER): Payer: BLUE CROSS/BLUE SHIELD | Admitting: Pharmacist Clinician (PhC)/ Clinical Pharmacy Specialist

## 2015-10-25 ENCOUNTER — Encounter (HOSPITAL_COMMUNITY): Payer: BLUE CROSS/BLUE SHIELD

## 2015-10-25 VITALS — BP 126/78 | HR 84 | Ht 59.0 in | Wt 165.0 lb

## 2015-10-25 DIAGNOSIS — I429 Cardiomyopathy, unspecified: Secondary | ICD-10-CM | POA: Diagnosis not present

## 2015-10-25 DIAGNOSIS — I10 Essential (primary) hypertension: Secondary | ICD-10-CM

## 2015-10-25 DIAGNOSIS — Z7901 Long term (current) use of anticoagulants: Secondary | ICD-10-CM

## 2015-10-25 DIAGNOSIS — E785 Hyperlipidemia, unspecified: Secondary | ICD-10-CM | POA: Diagnosis not present

## 2015-10-25 DIAGNOSIS — Z9889 Other specified postprocedural states: Secondary | ICD-10-CM

## 2015-10-25 DIAGNOSIS — I480 Paroxysmal atrial fibrillation: Secondary | ICD-10-CM

## 2015-10-25 NOTE — Assessment & Plan Note (Signed)
Blood pressure controlled. Continue present medications. 

## 2015-10-25 NOTE — Assessment & Plan Note (Signed)
Patient had postoperative atrial fibrillation. She remains in sinus rhythm on examination. Amiodarone was discontinued previously. I will discontinue Coumadin.

## 2015-10-25 NOTE — Assessment & Plan Note (Signed)
Patient is now euvolemic since dialysis was initiated.

## 2015-10-25 NOTE — Telephone Encounter (Addendum)
-----   Message from Mena Goes, RN sent at 10/22/2015  3:52 PM EST ----- Regarding: schedule   ----- Message -----    From: Serafina Mitchell, MD    Sent: 10/22/2015  10:06 AM      To: [facility] Charge Pool  10/22/2015:  Surgeon:  [name] Assistants:  Lennie Muckle Procedure:   Left radio-cephalic fistula   Follow-up 6 weeks with duplex of fistula  notiifed patient of post op appt. on 12-27-15 at 10:30 for lab then 11:30 to seen dr Trula Slade

## 2015-10-25 NOTE — Assessment & Plan Note (Signed)
Continue SBE prophylaxis. 

## 2015-10-25 NOTE — Assessment & Plan Note (Signed)
Continue beta blocker and hydralazine. I will plan to repeat her echocardiogram in 8 weeks. Now that her valve has been repaired, she has maintained sinus rhythm and blood pressure is controlled my hope would be that her LV function has normalized.

## 2015-10-25 NOTE — Patient Instructions (Signed)
Medication Instructions:   STOP WARFARIN  Follow-Up:  Your physician recommends that you schedule a follow-up appointment in: [place]   If you need a refill on your cardiac medications before your next appointment, please call your pharmacy.

## 2015-10-25 NOTE — Assessment & Plan Note (Signed)
Continue statin. 

## 2015-10-27 ENCOUNTER — Encounter (HOSPITAL_COMMUNITY)
Admission: RE | Admit: 2015-10-27 | Discharge: 2015-10-27 | Disposition: A | Discharge status: Home / Self Care | Payer: BLUE CROSS/BLUE SHIELD | Source: Ambulatory Visit | Attending: Cardiology | Admitting: Cardiology

## 2015-10-27 DIAGNOSIS — Z952 Presence of prosthetic heart valve: Secondary | ICD-10-CM | POA: Diagnosis present

## 2015-10-27 LAB — GLUCOSE, CAPILLARY
Glucose-Capillary: 80 mg/dL (ref 65–99)
Glucose-Capillary: 85 mg/dL (ref 65–99)

## 2015-10-27 NOTE — Progress Notes (Signed)
Pt arrive at cardiac rehab today CBG-80. Pt did not eat breakfast due to decrease appetite since recent hospitalization. Pt did drink V8 juice and took her am novolog.  Pt given banana.  Recheck CBG-5.  Pt reports she normally eats breakfast and takes her insulin after 10am in preparation for dialysis.  Pt advised to hod am insulin prior to cardiac rehab however eat light snack before exercise with plan to eat breakfast and take insulin later morning per her regular schedule.  Pt verbalized understanding.

## 2015-10-29 ENCOUNTER — Encounter (HOSPITAL_COMMUNITY)
Admission: RE | Admit: 2015-10-29 | Discharge: 2015-10-29 | Disposition: A | Discharge status: Home / Self Care | Payer: BLUE CROSS/BLUE SHIELD | Source: Ambulatory Visit | Attending: Cardiology | Admitting: Cardiology

## 2015-10-29 DIAGNOSIS — Z952 Presence of prosthetic heart valve: Secondary | ICD-10-CM | POA: Diagnosis present

## 2015-10-29 LAB — GLUCOSE, CAPILLARY
Glucose-Capillary: 99 mg/dL (ref 65–99)
Glucose-Capillary: 114 mg/dL — ABNORMAL HIGH (ref 65–99)

## 2015-11-01 ENCOUNTER — Encounter (HOSPITAL_COMMUNITY)
Admission: RE | Admit: 2015-11-01 | Discharge: 2015-11-01 | Disposition: A | Discharge status: Home / Self Care | Payer: BLUE CROSS/BLUE SHIELD | Source: Ambulatory Visit | Attending: Cardiology | Admitting: Cardiology

## 2015-11-01 DIAGNOSIS — Z952 Presence of prosthetic heart valve: Secondary | ICD-10-CM | POA: Diagnosis not present

## 2015-11-01 LAB — GLUCOSE, CAPILLARY
Glucose-Capillary: 73 mg/dL (ref 65–99)
Glucose-Capillary: 79 mg/dL (ref 65–99)

## 2015-11-01 NOTE — Progress Notes (Signed)
Pt CBG-91 upon arrival to cardiac rehab. Pt had eaten boiled egg and peanut butter crackers this am. Pt held her AM  Insulin.  Pt given banana.  Post exercise CBG-79.  Pt asymptomatic.  Pt given ginerale 8oz. Dr. Melford Aase notified.  Per Dr. Melford Aase, pt instructed to hold insulin unless CBG>200.  appt scheduled for pt to be seen tomorrow at 11:00am. pt spoke with Dr. Melford Aase nurse via phone for full instructions.   Pt verbalized understanding. Pt asymptomatic. Pt husband is driving, pt plans to eat breakfast upon leaving cardiac rehab.

## 2015-11-02 ENCOUNTER — Ambulatory Visit (INDEPENDENT_AMBULATORY_CARE_PROVIDER_SITE_OTHER): Payer: BLUE CROSS/BLUE SHIELD | Admitting: Internal Medicine

## 2015-11-02 VITALS — BP 96/62 | HR 88 | Temp 98.2°F | Resp 16 | Ht 60.0 in | Wt 160.0 lb

## 2015-11-02 DIAGNOSIS — I5032 Chronic diastolic (congestive) heart failure: Secondary | ICD-10-CM | POA: Diagnosis not present

## 2015-11-02 DIAGNOSIS — Z794 Long term (current) use of insulin: Secondary | ICD-10-CM

## 2015-11-02 DIAGNOSIS — E119 Type 2 diabetes mellitus without complications: Secondary | ICD-10-CM | POA: Diagnosis not present

## 2015-11-02 DIAGNOSIS — I10 Essential (primary) hypertension: Secondary | ICD-10-CM | POA: Diagnosis not present

## 2015-11-02 LAB — GLUCOSE, CAPILLARY: Glucose-Capillary: 91 mg/dL (ref 65–99)

## 2015-11-02 MED ORDER — CARVEDILOL 12.5 MG PO TABS: 12.5000 mg | ORAL_TABLET | Freq: Two times a day (BID) | ORAL | Status: DC

## 2015-11-02 MED ORDER — SERTRALINE HCL 25 MG PO TABS: 25.0000 mg | ORAL_TABLET | Freq: Every day | ORAL | Status: DC

## 2015-11-02 MED ORDER — ROSUVASTATIN CALCIUM 20 MG PO TABS: 20.0000 mg | ORAL_TABLET | Freq: Every day | ORAL | Status: DC

## 2015-11-02 MED ORDER — BUSPIRONE HCL 10 MG PO TABS: 10.0000 mg | ORAL_TABLET | Freq: Two times a day (BID) | ORAL | Status: DC

## 2015-11-02 NOTE — Patient Instructions (Signed)
Please stop taking the hydralazine.  Your target blood pressure range is 130-140/70-85.  If you are consistently running greater than 145/90 please call the office.  Lets see if increasing pressure will stop the fainting at dialysis.  Call me Friday morning and give me an update about blood pressure.  Please continue 8 units of insulin in the morning with breakfast with your regular constraints that if your sugar is less than 90 please do not take it.  Cut evening dose of insulin back to 4 units to see if this will help raise morning blood sugars.  If you are have any spikes call the office to let me know.

## 2015-11-02 NOTE — Progress Notes (Signed)
   Subjective:    Patient ID: Jody Nguyen, female    DOB: 1956/06/02, 59 y.o.   MRN: [id number]  HPI  Patient presents to the office for evaluation of low blood sugars and also some hypotension.  She reports that [facility] and they have been having issues with her blood sugars running less than 100.  She is currently doing 8 units twice daily.  She reports that she took some last night because her blood sugar was running 249 so she did take her insulin last night. She normally does her rehab first thing in the morning.   She reports that she has also been having a lot of hypotension during dialysis that is causing her to pass out.  She is aware that this is going to happen.  She reports that she gets hot and starts to feel bad prior to this happening.  She reports that at home she is running 120/70.  At dialysis she drops below 100/60.  She is currently doing dialysis MWF.    Review of Systems  Constitutional: Negative for fever, chills and fatigue.  Respiratory: Negative for chest tightness and shortness of breath.   Cardiovascular: Negative for chest pain and palpitations.  Gastrointestinal: Negative for nausea, vomiting, abdominal pain, diarrhea, constipation and abdominal distention.  Genitourinary: Negative for dysuria, urgency, frequency, hematuria and difficulty urinating.       Objective:   Physical Exam  Constitutional: She is oriented to person, place, and time. She appears well-developed and well-nourished. No distress.  HENT:  Head: Normocephalic.  Mouth/Throat: Oropharynx is clear and moist. No oropharyngeal exudate.  Eyes: Conjunctivae are normal. No scleral icterus.  Neck: Normal range of motion. Neck supple. No JVD present. No thyromegaly present.  Cardiovascular: Normal rate, regular rhythm and intact distal pulses.  Exam reveals no gallop and no friction rub.   No murmur heard. No peripheral edema. Graft present on left wrist  with dermabond in place. Clean and dry without drainage.   Pulmonary/Chest: Effort normal and breath sounds normal. No respiratory distress. She has no wheezes. She has no rales. She exhibits no tenderness.  Abdominal: Soft. Bowel sounds are normal. She exhibits no distension and no mass. There is no tenderness. There is no rebound and no guarding.  Musculoskeletal: Normal range of motion.  Lymphadenopathy:    She has no cervical adenopathy.  Neurological: She is alert and oriented to person, place, and time.  Skin: Skin is warm and dry. She is not diaphoretic.  Psychiatric: She has a normal mood and affect. Her behavior is normal. Judgment and thought content normal.  Nursing note and vitals reviewed.    Filed Vitals:   11/02/15 1055  BP: 96/62  Pulse: 88  Temp: 98.2 F (36.8 C)  Resp: 16          Assessment & Plan:    1. Essential hypertension -hypotensive today in office -having syncopal episodes during dialysis secondary to hypotension -stop hydralazine target range of 130-140/70-85  2. Chronic diastolic heart failure (HCC) -euvolemic currently  3. Insulin Dependent T2_DM  ([place]) -cut evening insulin to 4 units  -may sparingly increase starch in morning such as a 1/2 piece of wheat toast for increased BS prior to cardiac rehab.   -hold insulin at nighttime if BS less than 200.

## 2015-11-03 ENCOUNTER — Encounter (HOSPITAL_COMMUNITY)
Admission: RE | Admit: 2015-11-03 | Discharge: 2015-11-03 | Disposition: A | Discharge status: Home / Self Care | Payer: BLUE CROSS/BLUE SHIELD | Source: Ambulatory Visit | Attending: Cardiology | Admitting: Cardiology

## 2015-11-03 DIAGNOSIS — Z952 Presence of prosthetic heart valve: Secondary | ICD-10-CM | POA: Diagnosis not present

## 2015-11-03 LAB — GLUCOSE, CAPILLARY: Glucose-Capillary: 129 mg/dL — ABNORMAL HIGH (ref 65–99)

## 2015-11-03 NOTE — Progress Notes (Signed)
QUALITY OF LIFE SCORE REVIEW Pt completed Quality of Life survey as a participant in Cardiac Rehab.  Patient quality of life slightly altered by physical constraints which limits ability to perform as prior to recent cardiac illness.  Pt health and life style were significantly impacted due to her cardiac event. However pt has very positive attitude, feels very blessed and thankful to be alive and demonstrates strong spiritual faith.    Offered emotional support and reassurance.  Will continue to monitor and intervene as necessary.

## 2015-11-05 ENCOUNTER — Encounter (HOSPITAL_COMMUNITY)
Admission: RE | Admit: 2015-11-05 | Discharge: 2015-11-05 | Disposition: A | Discharge status: Home / Self Care | Payer: BLUE CROSS/BLUE SHIELD | Source: Ambulatory Visit | Attending: Cardiology | Admitting: Cardiology

## 2015-11-05 DIAGNOSIS — Z952 Presence of prosthetic heart valve: Secondary | ICD-10-CM | POA: Diagnosis not present

## 2015-11-05 LAB — GLUCOSE, CAPILLARY
Glucose-Capillary: 97 mg/dL (ref 65–99)
Glucose-Capillary: 98 mg/dL (ref 65–99)

## 2015-11-08 ENCOUNTER — Encounter (HOSPITAL_COMMUNITY)
Admission: RE | Admit: 2015-11-08 | Discharge: 2015-11-08 | Disposition: A | Discharge status: Home / Self Care | Payer: BLUE CROSS/BLUE SHIELD | Source: Ambulatory Visit | Attending: Cardiology | Admitting: Cardiology

## 2015-11-08 DIAGNOSIS — Z952 Presence of prosthetic heart valve: Secondary | ICD-10-CM | POA: Diagnosis not present

## 2015-11-08 LAB — GLUCOSE, CAPILLARY
Glucose-Capillary: 104 mg/dL — ABNORMAL HIGH (ref 65–99)
Glucose-Capillary: 107 mg/dL — ABNORMAL HIGH (ref 65–99)

## 2015-11-08 NOTE — Progress Notes (Signed)
Pt hypertensive at cardiac rehab today. Initial BP 140/106.  Recheck:  150/94.  Pt admits to eating fried chicken yesterday. Pt also reports discontinuing hydralazine last Tuesday due to hypotension at dialysis.  Med list reconciled.  Pt able to tolerate light walking with little change in BP.  Pt has dialysis today and nephrologist appt.  Pt took rehab report for MD review.  Pt asymptomatic.

## 2015-11-09 ENCOUNTER — Other Ambulatory Visit: Payer: Self-pay | Admitting: *Deleted

## 2015-11-09 NOTE — Telephone Encounter (Signed)
Patient called with concerns about BP readings fluctuating.  States BP was "very low" on Friday (11/05/15)  but unsure of numbers.  Then Monday (11/08/15) 150/106.  Patient was advised, per Starlyn Skeans, PA-C orders to take her Hydralazine 5 mg BID on the days she does not go to dialysis and to not take it on the days she is scheduled for dialysis.  Patient expressed understanding of these instructions.  Advised her to follow up in office or call with any further concerns or abnormal BP readings.

## 2015-11-10 ENCOUNTER — Encounter (HOSPITAL_COMMUNITY)
Admission: RE | Admit: 2015-11-10 | Discharge: 2015-11-10 | Disposition: A | Discharge status: Home / Self Care | Payer: BLUE CROSS/BLUE SHIELD | Source: Ambulatory Visit | Attending: Cardiology | Admitting: Cardiology

## 2015-11-10 DIAGNOSIS — Z952 Presence of prosthetic heart valve: Secondary | ICD-10-CM | POA: Diagnosis not present

## 2015-11-10 LAB — GLUCOSE, CAPILLARY
Glucose-Capillary: 99 mg/dL (ref 65–99)
Glucose-Capillary: 98 mg/dL (ref 65–99)

## 2015-11-10 NOTE — Progress Notes (Addendum)
Pt arrived at cardiac rehab reporting medication adjustment. Pt has started hydralazine 5mg  BID per Dr. Melford Aase.  Pt has not taken hydralazine VSS BP  low normal at rest and with exercise. Rehab report sent with pt to review with her nephrologist at dialysis.  Pt reports frequent syncopal episodes at dialysis due to hypotension.  Pt asymptomatic. Med list reconciled.

## 2015-11-12 ENCOUNTER — Encounter (HOSPITAL_COMMUNITY)
Admission: RE | Admit: 2015-11-12 | Discharge: 2015-11-12 | Disposition: A | Discharge status: Home / Self Care | Payer: BLUE CROSS/BLUE SHIELD | Source: Ambulatory Visit | Attending: Cardiology | Admitting: Cardiology

## 2015-11-12 DIAGNOSIS — Z952 Presence of prosthetic heart valve: Secondary | ICD-10-CM | POA: Diagnosis not present

## 2015-11-12 LAB — GLUCOSE, CAPILLARY
Glucose-Capillary: 101 mg/dL — ABNORMAL HIGH (ref 65–99)
Glucose-Capillary: 111 mg/dL — ABNORMAL HIGH (ref 65–99)

## 2015-11-15 ENCOUNTER — Encounter (HOSPITAL_COMMUNITY)
Admission: RE | Admit: 2015-11-15 | Discharge: 2015-11-15 | Disposition: A | Discharge status: Home / Self Care | Payer: BLUE CROSS/BLUE SHIELD | Source: Ambulatory Visit | Attending: Cardiology | Admitting: Cardiology

## 2015-11-15 DIAGNOSIS — Z952 Presence of prosthetic heart valve: Secondary | ICD-10-CM | POA: Diagnosis not present

## 2015-11-15 LAB — GLUCOSE, CAPILLARY
Glucose-Capillary: 96 mg/dL (ref 65–99)
Glucose-Capillary: 117 mg/dL — ABNORMAL HIGH (ref 65–99)

## 2015-11-17 ENCOUNTER — Encounter (HOSPITAL_COMMUNITY)
Admission: RE | Admit: 2015-11-17 | Discharge: 2015-11-17 | Disposition: A | Discharge status: Home / Self Care | Payer: BLUE CROSS/BLUE SHIELD | Source: Ambulatory Visit | Attending: Cardiology | Admitting: Cardiology

## 2015-11-17 DIAGNOSIS — Z952 Presence of prosthetic heart valve: Secondary | ICD-10-CM | POA: Diagnosis not present

## 2015-11-17 LAB — GLUCOSE, CAPILLARY: Glucose-Capillary: 127 mg/dL — ABNORMAL HIGH (ref 65–99)

## 2015-11-19 ENCOUNTER — Encounter (HOSPITAL_COMMUNITY)
Admission: RE | Admit: 2015-11-19 | Discharge: 2015-11-19 | Disposition: A | Discharge status: Home / Self Care | Payer: BLUE CROSS/BLUE SHIELD | Source: Ambulatory Visit | Attending: Cardiology | Admitting: Cardiology

## 2015-11-19 DIAGNOSIS — Z952 Presence of prosthetic heart valve: Secondary | ICD-10-CM | POA: Diagnosis not present

## 2015-11-19 LAB — GLUCOSE, CAPILLARY
Glucose-Capillary: 124 mg/dL — ABNORMAL HIGH (ref 65–99)
Glucose-Capillary: 116 mg/dL — ABNORMAL HIGH (ref 65–99)

## 2015-11-19 NOTE — Progress Notes (Signed)
No psychosocial needs identified, no intervention necessary. Pt feels she is getting better and stronger. Pt is relieved that her dialysis and blood pressure medication regimen is balanced to avoid syncopal episodes at dialysis.  Pt is also pleased her insulin dosage has decreased.  Pt is congratulated on her progress and encouraged to do more.  Will continue to monitor.

## 2015-11-23 ENCOUNTER — Other Ambulatory Visit: Payer: Self-pay | Admitting: *Deleted

## 2015-11-23 MED ORDER — HYDRALAZINE HCL 10 MG PO TABS: 5.0000 mg | ORAL_TABLET | Freq: Two times a day (BID) | ORAL | Status: DC

## 2015-11-24 ENCOUNTER — Encounter (HOSPITAL_COMMUNITY)
Admission: RE | Admit: 2015-11-24 | Discharge: 2015-11-24 | Disposition: A | Discharge status: Home / Self Care | Payer: BLUE CROSS/BLUE SHIELD | Source: Ambulatory Visit | Attending: Cardiology | Admitting: Cardiology

## 2015-11-24 DIAGNOSIS — Z952 Presence of prosthetic heart valve: Secondary | ICD-10-CM | POA: Diagnosis not present

## 2015-11-24 LAB — GLUCOSE, CAPILLARY
Glucose-Capillary: 102 mg/dL — ABNORMAL HIGH (ref 65–99)
Glucose-Capillary: 119 mg/dL — ABNORMAL HIGH (ref 65–99)

## 2015-11-24 NOTE — Progress Notes (Signed)
Jody Nguyen 59 y.o. female Nutrition Note Spoke with pt. Nutrition Plan and Nutrition Survey goals reviewed with pt. Pt is following Step 2 of the Therapeutic Lifestyle Changes diet. Per discussion, pt states food does not "sit well now." Pt c/o frequently vomiting. Pt feels food intolerance "is in my mind." Pt describes fear of food and not knowing what to eat. Pt Renal, Diabetic, Heart Healthy diet discussed. Pt wt reviewed in EMR. Pt has lost 31.3 lb over the past 6 months. Pt would like to continue to lose wt. Pt is diabetic. Last A1c indicates blood glucose well-controlled. This Probation officer went over Diabetes Education test results. Pt checks CBG's once daily "unless I think I need to check it for some reason." Pt Novolog decreased from 8 units BID to 4 units daily. Pt with dx of CHF. Per discussion, pt does not use canned/convenience foods often. Pt rarely adds salt to food. Pt expressed understanding of the information reviewed. Pt aware of nutrition education classes offered.  Lab Results  Component Value Date   HGBA1C 5.5 10/07/2015   Vitals - 1 value per visit 11/02/2015 10/25/2015 10/22/2015 10/19/2015  Weight (lb) 160 165 159 159.17   Vitals - 1 value per visit 10/19/2015 10/07/2015 10/05/2015 09/10/2015  Weight (lb) 159.7 160 158 158.2   Vitals - 1 value per visit 07/08/2015 07/08/2015 07/06/2015 07/06/2015 07/06/2015  Weight (lb)  180  181.88 180   Vitals - 1 value per visit 07/01/2015 06/30/2015 06/30/2015 06/29/2015 06/28/2015  Weight (lb)   184 184    Vitals - 1 value per visit 06/25/2015 06/23/2015 06/19/2015 06/18/2015  Weight (lb) 186 185 184.97    Vitals - 1 value per visit 06/14/2015 06/08/2015 05/31/2015 05/27/2015 04/30/2015  Weight (lb) 186.8  189.38  191.3    Nutrition Diagnosis ? Food-and nutrition-related knowledge deficit related to lack of exposure to information as related to diagnosis of: ? CVD ? DM ? Obesity related to excessive energy intake as evidenced by a BMI of 32.1  Nutrition  RX/ Estimated Daily Nutrition Needs for: wt loss 1200-1500 Kcal, 30-40 gm fat, 9-11 gm sat fat, 1.1-1.4 gm trans-fat, <1500 mg sodium, 150-175 gm CHO   Nutrition Intervention ? Pt's individual nutrition plan reviewed with pt. ? Benefits of adopting Therapeutic Lifestyle Changes discussed when Medficts reviewed. ? Pt to attend the Portion Distortion class ? Pt to attend the Diabetes Q & A class ? Pt to attend the   ? Nutrition I class                  ? Nutrition II class     ? Diabetes Blitz class    ? Pt given handouts for: ? Nutrition I class ? Nutrition II class ? Diabetes Blitz Class ? 5 day Renal, diabetic menu ideas ? Continue client-centered nutrition education by RD, as part of interdisciplinary care. Goal(s) ? Pt to identify food quantities necessary to achieve: ? wt loss to a goal wt of 134-152 lb (61.3-69.5 kg) at graduation from cardiac rehab.  ? Pt to describe the benefit of including fruits, vegetables, whole grains, and low-fat dairy products in a heart healthy meal plan. ? CBG concentrations in the normal range or as close to normal as is safely possible. Monitor and Evaluate progress toward nutrition goal with team. Nutrition Risk: Change to Moderate Derek Mound, M.Ed, RD, LDN, CDE 11/24/2015 10:54 AM

## 2015-11-26 ENCOUNTER — Encounter (HOSPITAL_COMMUNITY)
Admission: RE | Admit: 2015-11-26 | Discharge: 2015-11-26 | Disposition: A | Discharge status: Home / Self Care | Payer: BLUE CROSS/BLUE SHIELD | Source: Ambulatory Visit | Attending: Cardiology | Admitting: Cardiology

## 2015-11-26 DIAGNOSIS — Z952 Presence of prosthetic heart valve: Secondary | ICD-10-CM | POA: Diagnosis not present

## 2015-11-26 LAB — GLUCOSE, CAPILLARY
Glucose-Capillary: 89 mg/dL (ref 65–99)
Glucose-Capillary: 123 mg/dL — ABNORMAL HIGH (ref 65–99)

## 2015-11-26 NOTE — Progress Notes (Signed)
Pt pre exercise blood glucose checked. Pt reading 89.  Pt wonders if she needs to continue to take insulin because of the low reading in rehab as well as at home despite eating and a significant reduction in her insulin dosage to 4 units. Pt plans to call her doctors office for an appt to discuss the need for insulin. Pt asymptomatic.  Pt given lemonade and banana.  Post exercise blood glucose was 123.  Pt thinks she may hold her insulin and see what her blood glucose will do, record that information to share with the MD when she has the follow up.  Pt preferred to call and schedule the appt due to her husband who is the driver schedule. Will continue to monitor. Cherre Huger, BSN

## 2015-12-01 ENCOUNTER — Encounter (HOSPITAL_COMMUNITY)
Admission: RE | Admit: 2015-12-01 | Discharge: 2015-12-01 | Disposition: A | Discharge status: Home / Self Care | Payer: BLUE CROSS/BLUE SHIELD | Source: Ambulatory Visit | Attending: Cardiology | Admitting: Cardiology

## 2015-12-01 DIAGNOSIS — Z952 Presence of prosthetic heart valve: Secondary | ICD-10-CM | POA: Insufficient documentation

## 2015-12-01 LAB — GLUCOSE, CAPILLARY
Glucose-Capillary: 96 mg/dL (ref 65–99)
Glucose-Capillary: 94 mg/dL (ref 65–99)

## 2015-12-03 ENCOUNTER — Encounter (HOSPITAL_COMMUNITY)
Admission: RE | Admit: 2015-12-03 | Discharge: 2015-12-03 | Disposition: A | Discharge status: Home / Self Care | Payer: BLUE CROSS/BLUE SHIELD | Source: Ambulatory Visit | Attending: Cardiology | Admitting: Cardiology

## 2015-12-03 DIAGNOSIS — Z952 Presence of prosthetic heart valve: Secondary | ICD-10-CM | POA: Diagnosis not present

## 2015-12-03 LAB — GLUCOSE, CAPILLARY: Glucose-Capillary: 96 mg/dL (ref 65–99)

## 2015-12-06 ENCOUNTER — Encounter (HOSPITAL_COMMUNITY): Payer: Self-pay | Admitting: Emergency Medicine

## 2015-12-06 ENCOUNTER — Emergency Department (HOSPITAL_COMMUNITY)
Admission: EM | Admit: 2015-12-06 | Discharge: 2015-12-06 | Disposition: A | Discharge status: Home / Self Care | Payer: BLUE CROSS/BLUE SHIELD | Attending: Emergency Medicine | Admitting: Emergency Medicine

## 2015-12-06 ENCOUNTER — Encounter (HOSPITAL_COMMUNITY): Payer: BLUE CROSS/BLUE SHIELD

## 2015-12-06 ENCOUNTER — Emergency Department (HOSPITAL_COMMUNITY): Payer: BLUE CROSS/BLUE SHIELD

## 2015-12-06 DIAGNOSIS — N184 Chronic kidney disease, stage 4 (severe): Secondary | ICD-10-CM | POA: Diagnosis not present

## 2015-12-06 DIAGNOSIS — Z8719 Personal history of other diseases of the digestive system: Secondary | ICD-10-CM | POA: Diagnosis not present

## 2015-12-06 DIAGNOSIS — Z862 Personal history of diseases of the blood and blood-forming organs and certain disorders involving the immune mechanism: Secondary | ICD-10-CM | POA: Insufficient documentation

## 2015-12-06 DIAGNOSIS — Z992 Dependence on renal dialysis: Secondary | ICD-10-CM | POA: Diagnosis not present

## 2015-12-06 DIAGNOSIS — Y9389 Activity, other specified: Secondary | ICD-10-CM | POA: Insufficient documentation

## 2015-12-06 DIAGNOSIS — W19XXXA Unspecified fall, initial encounter: Secondary | ICD-10-CM | POA: Insufficient documentation

## 2015-12-06 DIAGNOSIS — Y999 Unspecified external cause status: Secondary | ICD-10-CM | POA: Insufficient documentation

## 2015-12-06 DIAGNOSIS — Z79899 Other long term (current) drug therapy: Secondary | ICD-10-CM | POA: Insufficient documentation

## 2015-12-06 DIAGNOSIS — Z7982 Long term (current) use of aspirin: Secondary | ICD-10-CM | POA: Diagnosis not present

## 2015-12-06 DIAGNOSIS — I5032 Chronic diastolic (congestive) heart failure: Secondary | ICD-10-CM | POA: Insufficient documentation

## 2015-12-06 DIAGNOSIS — Y9289 Other specified places as the place of occurrence of the external cause: Secondary | ICD-10-CM | POA: Insufficient documentation

## 2015-12-06 DIAGNOSIS — R55 Syncope and collapse: Secondary | ICD-10-CM | POA: Insufficient documentation

## 2015-12-06 DIAGNOSIS — Z8673 Personal history of transient ischemic attack (TIA), and cerebral infarction without residual deficits: Secondary | ICD-10-CM | POA: Insufficient documentation

## 2015-12-06 DIAGNOSIS — Z794 Long term (current) use of insulin: Secondary | ICD-10-CM | POA: Diagnosis not present

## 2015-12-06 DIAGNOSIS — Z8709 Personal history of other diseases of the respiratory system: Secondary | ICD-10-CM | POA: Insufficient documentation

## 2015-12-06 DIAGNOSIS — E785 Hyperlipidemia, unspecified: Secondary | ICD-10-CM | POA: Diagnosis not present

## 2015-12-06 DIAGNOSIS — I129 Hypertensive chronic kidney disease with stage 1 through stage 4 chronic kidney disease, or unspecified chronic kidney disease: Secondary | ICD-10-CM | POA: Diagnosis not present

## 2015-12-06 DIAGNOSIS — R011 Cardiac murmur, unspecified: Secondary | ICD-10-CM | POA: Diagnosis not present

## 2015-12-06 DIAGNOSIS — E119 Type 2 diabetes mellitus without complications: Secondary | ICD-10-CM | POA: Insufficient documentation

## 2015-12-06 DIAGNOSIS — Z9889 Other specified postprocedural states: Secondary | ICD-10-CM | POA: Diagnosis not present

## 2015-12-06 LAB — CBC WITH DIFFERENTIAL/PLATELET
Basophils Absolute: 0 10*3/uL (ref 0.0–0.1)
Basophils Relative: 1 %
Eosinophils Absolute: 0.5 10*3/uL (ref 0.0–0.7)
Eosinophils Relative: 10 %
HCT: 39.7 % (ref 36.0–46.0)
Hemoglobin: 12.3 g/dL (ref 12.0–15.0)
Lymphocytes Relative: 34 %
Lymphs Abs: 1.8 10*3/uL (ref 0.7–4.0)
MCH: 26.1 pg (ref 26.0–34.0)
MCHC: 31 g/dL (ref 30.0–36.0)
MCV: 84.3 fL (ref 78.0–100.0)
Monocytes Absolute: 0.5 10*3/uL (ref 0.1–1.0)
Monocytes Relative: 10 %
Neutro Abs: 2.4 10*3/uL (ref 1.7–7.7)
Neutrophils Relative %: 46 %
Platelets: 149 10*3/uL — ABNORMAL LOW (ref 150–400)
RBC: 4.71 MIL/uL (ref 3.87–5.11)
RDW: 17.6 % — ABNORMAL HIGH (ref 11.5–15.5)
WBC: 5.2 10*3/uL (ref 4.0–10.5)

## 2015-12-06 LAB — BASIC METABOLIC PANEL
Anion gap: 14 (ref 5–15)
BUN: 26 mg/dL — ABNORMAL HIGH (ref 6–20)
CO2: 28 mmol/L (ref 22–32)
Calcium: 9.7 mg/dL (ref 8.9–10.3)
Chloride: 94 mmol/L — ABNORMAL LOW (ref 101–111)
Creatinine, Ser: 5.42 mg/dL — ABNORMAL HIGH (ref 0.44–1.00)
GFR calc Af Amer: 9 mL/min — ABNORMAL LOW (ref >60)
GFR calc non Af Amer: 8 mL/min — ABNORMAL LOW (ref >60)
Glucose, Bld: 114 mg/dL — ABNORMAL HIGH (ref 65–99)
Potassium: 4 mmol/L (ref 3.5–5.1)
Sodium: 136 mmol/L (ref 135–145)

## 2015-12-06 LAB — I-STAT TROPONIN, ED: Troponin i, poc: 0.01 ng/mL (ref 0.00–0.08)

## 2015-12-06 LAB — CBG MONITORING, ED: Glucose-Capillary: 80 mg/dL (ref 65–99)

## 2015-12-06 NOTE — ED Notes (Signed)
Patient comes from Dialysis had a Syncopal witness lasted about 5 mins. Patient completed treatment. Patient Alert and Oriented x4 with EMS.Patient complaint on arrival Lightheaded.   Vitals EMS  152/100 HR 96 RR20 98%.

## 2015-12-06 NOTE — ED Provider Notes (Signed)
CSN: [id number]     Arrival date & time 12/06/15  1706 History   First MD Initiated Contact with Patient 12/06/15 1709     Chief Complaint  Patient presents with  . Loss of Consciousness    HPI   Jody Nguyen is a 60 y.o. female with a PMH of HLD, HTN, DM, CHF, CVA, ESRD on HD who presents to the ED with syncope. Per record review, appears patient has had several episodes of syncope due to hypotension during dialysis. She reports today, she completed treatment and was standing on the scale when she lost consciousness. She states her BP at that time was around [id number] systolic. She reports she fell and hit her head when she passed out, and she denies precipitating symptoms. She denies additional injury, fever, chills, HA, lightheadedness, dizziness, chest pain, shortness of breath, abdominal pain, N/V, numbness, weakness, paresthesia.   Past Medical History  Diagnosis Date  . HLD (hyperlipidemia)     takes Crestor daily  . Vitamin D deficiency   . CVA (cerebral infarction) 1997    no residual deficit  . Anemia   . CKD (chronic kidney disease) stage 4, GFR 15-29 ml/min (HCC)   . Diverticulitis   . History of cardiovascular stress test     a. Myoview Oct 2012 showed EF 49%, no ischemia, LVE  . Pericardial effusion     chronic; felt to be poss related to minoxidil >> DC'd  . S/P cardiac catheterization     a. R/L HC 06/18/15:  mLAD 30%; severe pulmo HTN with PA sat 43%, CI 1.86, prominent V waves indicative of MR; resting hypoxemia O2 sat 86% on RA  . Pulmonary hypertension ([place]) 06/18/2015  . Severe mitral regurgitation   . Chronic diastolic heart failure ([place])   . Stroke ([place]) 1997    no residual effect  . Heart murmur   . Shortness of breath dyspnea   . Constipation   . Hypertension     takes Hydralazine,Losartan,and Labetalol daily  . DM (diabetes mellitus) ([place])     takes Novolog and Metformin daily  . Chronic diastolic CHF (congestive heart failure) (HCC)     takes Furosemide  daily  . Respiratory failure, acute hypoxic, post-operative 07/08/2015    Requiring ECMO support  . S/P minimally invasive mitral valve repair 07/08/2015    Complex valvuloplasty including artificial Gore-tex neochord placement x10 and 26 mm Sorin Memo 3D Rechord ring annuloplasty via right mini thoracotomy approach  . Wears glasses    Past Surgical History  Procedure Laterality Date  . Cesarean section      x 2  . Tee without cardioversion N/A 06/08/2015    Procedure: TRANSESOPHAGEAL ECHOCARDIOGRAM (TEE);  Surgeon: Lelon Perla, MD;  Location: [facility] ENDOSCOPY;  Service: Cardiovascular;  Laterality: N/A;  . Cardiac catheterization    . Cardiac catheterization N/A 06/18/2015    Procedure: Right/Left Heart Cath and Coronary Angiography;  Surgeon: Jettie Booze, MD;  Location: [facility] CV LAB;  Service: Cardiovascular;  Laterality: N/A;  . Colonoscopy    . Multiple extractions with alveoloplasty N/A 06/30/2015    Procedure: MULTIPLE EXTRACTIONS OF TOOTH #'S 4  AND 30 WITH ALVEOLOPLASTY AND GROSS DEBRIDEMENT  OF REMAINING TEETH;  Surgeon: Lenn Cal, DDS;  Location: [place];  Service: Oral Surgery;  Laterality: N/A;  . Mitral valve repair Right 07/08/2015    Procedure: MINIMALLY INVASIVE MITRAL VALVE REPAIR with a 26 Sorin Memo 3D Rechord;  Surgeon: Rexene Alberts,  MD;  Location: [facility] OR;  Service: Open Heart Surgery;  Laterality: Right;  . Tee without cardioversion N/A 07/08/2015    Procedure: TRANSESOPHAGEAL ECHOCARDIOGRAM (TEE);  Surgeon: Rexene Alberts, MD;  Location: [place];  Service: Open Heart Surgery;  Laterality: N/A;  . Cannulation for cardiopulmonary bypass N/A 07/08/2015    Procedure: CANNULATION FOR ECMO;  Surgeon: Rexene Alberts, MD;  Location: [place];  Service: Open Heart Surgery;  Laterality: N/A;  . Av fistula placement Left 10/22/2015    Procedure: RADIOCEPHALIC ARTERIOVENOUS (AV) FISTULA CREATION;  Surgeon: Serafina Mitchell, MD;  Location: [facility] OR;  Service: Vascular;   Laterality: Left;   Family History  Problem Relation Age of Onset  . Heart attack Brother     MI in his 64s  . Heart disease Brother     before age 35  . Cancer Sister     breast cancer  . Breast cancer Sister   . Stroke Mother    Social History  Substance Use Topics  . Smoking status: Never Smoker   . Smokeless tobacco: Never Used  . Alcohol Use: No   OB History    Gravida Para Term Preterm AB TAB SAB Ectopic Multiple Living   3 3 3              Review of Systems  Constitutional: Negative for fever and chills.  Eyes: Negative for visual disturbance.  Respiratory: Negative for shortness of breath.   Cardiovascular: Negative for chest pain.  Gastrointestinal: Negative for nausea, vomiting and abdominal pain.  Musculoskeletal: Negative for myalgias and arthralgias.  Neurological: Positive for syncope. Negative for dizziness, weakness, light-headedness, numbness and headaches.  All other systems reviewed and are negative.     Allergies  Minoxidil; Lipitor; and Ace inhibitors  Home Medications   Prior to Admission medications   Medication Sig Start Date End Date Taking? Authorizing Provider  aspirin EC 81 MG tablet Take 81 mg by mouth daily.   Yes Historical Provider, MD  busPIRone (BUSPAR) 10 MG tablet Take 1 tablet (10 mg total) by mouth 2 (two) times daily. 11/02/15 11/01/16 Yes Courtney Forcucci, PA-C  carvedilol (COREG) 12.5 MG tablet Take 1 tablet (12.5 mg total) by mouth 2 (two) times daily. 11/02/15 11/01/16 Yes Courtney Forcucci, PA-C  hydrALAZINE (APRESOLINE) 10 MG tablet Take 0.5 tablets (5 mg total) by mouth 2 (two) times daily. 11/23/15  Yes Unk Pinto, MD  insulin aspart protamine - aspart (NOVOLOG MIX 70/30 FLEXPEN) (70-30) 100 UNIT/ML FlexPen Inject 0.1 mLs (10 Units total) into the skin 2 (two) times daily. Patient taking differently: Inject 4 Units into the skin daily with supper.  06/19/15  Yes Liliane Shi, PA-C  rosuvastatin (CRESTOR) 20 MG tablet  Take 1 tablet (20 mg total) by mouth daily. 11/02/15  Yes Courtney Forcucci, PA-C  sertraline (ZOLOFT) 25 MG tablet Take 1 tablet (25 mg total) by mouth daily. 11/02/15  Yes Courtney Forcucci, PA-C    BP 145/98 mmHg  Pulse 96  Temp(Src) 97.6 F (36.4 C) (Oral)  Resp 19  SpO2 98% Physical Exam  Constitutional: She is oriented to person, place, and time. She appears well-developed and well-nourished. No distress.  HENT:  Head: Normocephalic and atraumatic. Head is without raccoon's eyes, without Battle's sign, without abrasion, without contusion and without laceration.  Right Ear: External ear normal.  Left Ear: External ear normal.  Nose: Nose normal.  Mouth/Throat: Uvula is midline, oropharynx is clear and moist and mucous membranes are normal.  TTP of posterior head. No hematoma. No palpable deformity.  Eyes: Conjunctivae, EOM and lids are normal. Pupils are equal, round, and reactive to light. Right eye exhibits no discharge. Left eye exhibits no discharge. No scleral icterus.  Neck: Normal range of motion. Neck supple.  Cardiovascular: Normal rate, regular rhythm, normal heart sounds, intact distal pulses and normal pulses.   Pulmonary/Chest: Effort normal and breath sounds normal. No respiratory distress. She has no wheezes. She has no rales. She exhibits no tenderness.  Abdominal: Soft. Normal appearance and bowel sounds are normal. She exhibits no distension and no mass. There is no tenderness. There is no rigidity, no rebound and no guarding.  Musculoskeletal: Normal range of motion. She exhibits no edema or tenderness.  Neurological: She is alert and oriented to person, place, and time. She has normal strength. No cranial nerve deficit or sensory deficit.  Patient able to ambulate without difficulty.  Skin: Skin is warm, dry and intact. No rash noted. She is not diaphoretic. No erythema. No pallor.  Psychiatric: She has a normal mood and affect. Her speech is normal and behavior is  normal.  Nursing note and vitals reviewed.   ED Course  Procedures (including critical care time)  Labs Review Labs Reviewed  CBC WITH DIFFERENTIAL/PLATELET - Abnormal; Notable for the following:    RDW 17.6 (*)    Platelets 149 (*)    All other components within normal limits  BASIC METABOLIC PANEL - Abnormal; Notable for the following:    Chloride 94 (*)    Glucose, Bld 114 (*)    BUN 26 (*)    Creatinine, Ser 5.42 (*)    GFR calc non Af Amer 8 (*)    GFR calc Af Amer 9 (*)    All other components within normal limits  CBG MONITORING, ED  I-STAT TROPOININ, ED    Imaging Review Ct Head Wo Contrast  12/06/2015  CLINICAL DATA:  Dizziness after fall with loss of consciousness. Chronic renal failure EXAM: CT HEAD WITHOUT CONTRAST TECHNIQUE: Contiguous axial images were obtained from the base of the skull through the vertex without intravenous contrast. COMPARISON:  None. FINDINGS: The ventricles are normal in size and configuration. There is no intracranial mass, hemorrhage, extra-axial fluid collection, or midline shift. There is patchy small vessel disease throughout the centra semiovale bilaterally with several small infarcts in this area which appear chronic. There is evidence of a small prior infarct in the medial superior right cerebellum. There is a chronic appearing small infarct in the lateral superior left cerebellum. There is an area of decreased attenuation in the inferior left frontal lobe at the gray - white junction which must be regarded as age uncertain. A recent infarct in this area cannot be excluded by CT. The bony calvarium appears intact. The mastoid air cells are clear. No intraorbital lesions are identified. IMPRESSION: Extensive periventricular small vessel disease with several prior appearing small infarcts in this area. Prior lacunar infarcts in the cerebellum. Age uncertain infarct in the inferior left cerebellum ; recent infarct in this area cannot be excluded.  This finding is best appreciated on axial slice 9 series 2. No hemorrhage or mass effect. Electronically Signed   By: Lowella Grip III M.D.   On: 12/06/2015 18:51     I have personally reviewed and evaluated these images and lab results as part of my medical decision-making.   EKG Interpretation   Date/Time:  Monday December 06 2015 17:37:11 EST Ventricular Rate:  17  PR Interval:  245 QRS Duration: 101 QT Interval:  421 QTC Calculation: 512 R Axis:   -78 Text Interpretation:  Sinus rhythm Prolonged PR interval Left anterior  fascicular block Abnormal R-wave progression, late transition Prolonged QT  interval Baseline wander in lead(s) V2 T wave changes resolved from June  2016 Reconfirmed by Regenia Skeeter  MD, SCOTT [phone]) on 12/06/2015 7:11:42 PM      MDM   Final diagnoses:  Syncope    60 year old female presents with syncopal episode after dialysis today. States she was standing up and passed out, hitting her head. Per record review, appears patient has had episodes of syncope during dialysis, which is been attributed to hypotension. Patient states her blood pressure was in the low [id number] systolic today prior to her LOC. She denies precipitating symptoms, and has no complaints at this time.   Patient is afebrile. Vital signs stable. Heart regular rate and rhythm. Lungs clear to auscultation bilaterally. Abdomen soft, nontender, nondistended. Normal neuro exam with no focal deficit. Patient ambulates without difficulty.  CBC negative for leukocytosis or anemia. BMP remarkable for creatinine 5.42, which appears consistent with patient's baseline. EKG sinus rhythm, HR 89, prolonged PR, prolonged QT. Troponin negative. Head CT remarkable for extensive periventricular small vessel disease with several prior appearing small infarcts, prior lacunar infarcts in the cerebellum, age uncertain infarct in the inferior left cerebellum, no hemorrhage or mass effect.   Patient discussed with Dr.  Regenia Skeeter. Presentation not consistent with CVA. Patient is nontoxic and well-appearing, feel she is stable for discharge at this time. Return precautions discussed. Patient to follow up with PCP. Patient verbalizes her understanding and is in agreement with plan.  BP 145/98 mmHg  Pulse 96  Temp(Src) 97.6 F (36.4 C) (Oral)  Resp 19  SpO2 98%     Marella Chimes, PA-C 12/07/15 0102  Sherwood Gambler, MD 12/07/15 [phone]

## 2015-12-06 NOTE — Discharge Instructions (Signed)
1. Medications: usual home medications 2. Treatment: rest, drink plenty of fluids 3. Follow Up: please followup with your primary doctor this week for discussion of your diagnoses and further evaluation after today's visit; please return to the ER for severe headache, dizziness, weakness, new or worsening symptoms   Syncope Syncope is a medical term for fainting or passing out. This means you lose consciousness and drop to the ground. People are generally unconscious for less than 5 minutes. You may have some muscle twitches for up to 15 seconds before waking up and returning to normal. Syncope occurs more often in older adults, but it can happen to anyone. While most causes of syncope are not dangerous, syncope can be a sign of a serious medical problem. It is important to seek medical care.  CAUSES  Syncope is caused by a sudden drop in blood flow to the brain. The specific cause is often not determined. Factors that can bring on syncope include:  Taking medicines that lower blood pressure.  Sudden changes in posture, such as standing up quickly.  Taking more medicine than prescribed.  Standing in one place for too long.  Seizure disorders.  Dehydration and excessive exposure to heat.  Low blood sugar (hypoglycemia).  Straining to have a bowel movement.  Heart disease, irregular heartbeat, or other circulatory problems.  Fear, emotional distress, seeing blood, or severe pain. SYMPTOMS  Right before fainting, you may:  Feel dizzy or light-headed.  Feel nauseous.  See all white or all black in your field of vision.  Have cold, clammy skin. DIAGNOSIS  Your health care provider will ask about your symptoms, perform a physical exam, and perform an electrocardiogram (ECG) to record the electrical activity of your heart. Your health care provider may also perform other heart or blood tests to determine the cause of your syncope which may include:  Transthoracic echocardiogram  (TTE). During echocardiography, sound waves are used to evaluate how blood flows through your heart.  Transesophageal echocardiogram (TEE).  Cardiac monitoring. This allows your health care provider to monitor your heart rate and rhythm in real time.  Holter monitor. This is a portable device that records your heartbeat and can help diagnose heart arrhythmias. It allows your health care provider to track your heart activity for several days, if needed.  Stress tests by exercise or by giving medicine that makes the heart beat faster. TREATMENT  In most cases, no treatment is needed. Depending on the cause of your syncope, your health care provider may recommend changing or stopping some of your medicines. HOME CARE INSTRUCTIONS  Have someone stay with you until you feel stable.  Do not drive, use machinery, or play sports until your health care provider says it is okay.  Keep all follow-up appointments as directed by your health care provider.  Lie down right away if you start feeling like you might faint. Breathe deeply and steadily. Wait until all the symptoms have passed.  Drink enough fluids to keep your urine clear or pale yellow.  If you are taking blood pressure or heart medicine, get up slowly and take several minutes to sit and then stand. This can reduce dizziness. SEEK IMMEDIATE MEDICAL CARE IF:   You have a severe headache.  You have unusual pain in the chest, abdomen, or back.  You are bleeding from your mouth or rectum, or you have black or tarry stool.  You have an irregular or very fast heartbeat.  You have pain with breathing.  You  have repeated fainting or seizure-like jerking during an episode.  You faint when sitting or lying down.  You have confusion.  You have trouble walking.  You have severe weakness.  You have vision problems. If you fainted, call your local emergency services (911 in [place] Do not drive yourself to the hospital.    This  information is not intended to replace advice given to you by your health care provider. Make sure you discuss any questions you have with your health care provider.   Document Released: 11/13/2005 Document Revised: 03/30/2015 Document Reviewed: 01/12/2012 Elsevier Interactive Patient Education [employer].

## 2015-12-08 ENCOUNTER — Encounter (HOSPITAL_COMMUNITY)
Admission: RE | Admit: 2015-12-08 | Discharge: 2015-12-08 | Disposition: A | Discharge status: Home / Self Care | Payer: BLUE CROSS/BLUE SHIELD | Source: Ambulatory Visit | Attending: Cardiology | Admitting: Cardiology

## 2015-12-08 DIAGNOSIS — Z952 Presence of prosthetic heart valve: Secondary | ICD-10-CM | POA: Diagnosis not present

## 2015-12-08 LAB — GLUCOSE, CAPILLARY
Glucose-Capillary: 74 mg/dL (ref 65–99)
Glucose-Capillary: 83 mg/dL (ref 65–99)

## 2015-12-10 ENCOUNTER — Encounter (HOSPITAL_COMMUNITY)
Admission: RE | Admit: 2015-12-10 | Discharge: 2015-12-10 | Disposition: A | Discharge status: Home / Self Care | Payer: BLUE CROSS/BLUE SHIELD | Source: Ambulatory Visit | Attending: Cardiology | Admitting: Cardiology

## 2015-12-10 DIAGNOSIS — Z952 Presence of prosthetic heart valve: Secondary | ICD-10-CM | POA: Diagnosis not present

## 2015-12-10 LAB — GLUCOSE, CAPILLARY
Glucose-Capillary: 95 mg/dL (ref 65–99)
Glucose-Capillary: 93 mg/dL (ref 65–99)

## 2015-12-13 ENCOUNTER — Encounter (HOSPITAL_COMMUNITY)
Admission: RE | Admit: 2015-12-13 | Discharge: 2015-12-13 | Disposition: A | Discharge status: Home / Self Care | Payer: BLUE CROSS/BLUE SHIELD | Source: Ambulatory Visit | Attending: Cardiology | Admitting: Cardiology

## 2015-12-13 DIAGNOSIS — Z952 Presence of prosthetic heart valve: Secondary | ICD-10-CM | POA: Diagnosis not present

## 2015-12-13 LAB — GLUCOSE, CAPILLARY
Glucose-Capillary: 110 mg/dL — ABNORMAL HIGH (ref 65–99)
Glucose-Capillary: 87 mg/dL (ref 65–99)

## 2015-12-13 NOTE — Progress Notes (Signed)
HPI: Follow-up hypertension, diastolic heart failure and previous mitral valve repair. Renal dopplers Nov 2013 showed no RAS. Note previous pericardial effusion with minoxidil.  Transesophageal echocardiogram July 2016 showed normal LV function, moderate to severe left atrial enlargement, small pericardial effusion, flail segment of anterior mitral valve leaflet with severe mitral regurgitation and mild tricuspid regurgitation. Cardiac catheterization September 2016 showed nonobstructive coronary disease and severely elevated pulmonary pressures. Carotid Dopplers August 2016 showed 1-39% bilateral stenosis. Patient had mitral valve repair in August 2016. Patient's course was complicated by acute hypoxic respiratory failure refractory to conventional ventilatory support. She was transferred to [facility] for ECMO. Course was complicated by need for tracheostomy. She also developed acute on chronic renal failure and now requires dialysis. Follow-up echocardiogram at [facility] showed ejection fraction 40%. Note I do not have all records available. Had syncopal episode January 9 following dialysis. Since she was last seen, She denies dyspnea, chest pain, palpitations or pedal edema. She does have problems with low blood pressure following dialysis associated with dizziness. This was also the case with the above syncopal episode.  Current Outpatient Prescriptions  Medication Sig Dispense Refill  . aspirin EC 81 MG tablet Take 81 mg by mouth daily.    . busPIRone (BUSPAR) 10 MG tablet Take 1 tablet (10 mg total) by mouth 2 (two) times daily. 180 tablet 1  . carvedilol (COREG) 12.5 MG tablet Take 1 tablet (12.5 mg total) by mouth 2 (two) times daily. 90 tablet 0  . hydrALAZINE (APRESOLINE) 10 MG tablet Take 0.5 tablets (5 mg total) by mouth 2 (two) times daily. 180 tablet 2  . insulin aspart protamine - aspart (NOVOLOG MIX 70/30 FLEXPEN) (70-30) 100 UNIT/ML FlexPen Inject 0.1 mLs (10 Units total) into  the skin 2 (two) times daily. (Patient taking differently: Inject 4 Units into the skin daily with supper. ) 15 mL 11  . rosuvastatin (CRESTOR) 20 MG tablet Take 1 tablet (20 mg total) by mouth daily. 90 tablet 1  . sertraline (ZOLOFT) 25 MG tablet Take 1 tablet (25 mg total) by mouth daily. 90 tablet 1   No current facility-administered medications for this visit.     Past Medical History  Diagnosis Date  . HLD (hyperlipidemia)     takes Crestor daily  . Vitamin D deficiency   . CVA (cerebral infarction) 1997    no residual deficit  . Anemia   . CKD (chronic kidney disease) stage 4, GFR 15-29 ml/min (HCC)   . Diverticulitis   . History of cardiovascular stress test     a. Myoview Oct 2012 showed EF 49%, no ischemia, LVE  . Pericardial effusion     chronic; felt to be poss related to minoxidil >> DC'd  . S/P cardiac catheterization     a. R/L HC 06/18/15:  mLAD 30%; severe pulmo HTN with PA sat 43%, CI 1.86, prominent V waves indicative of MR; resting hypoxemia O2 sat 86% on RA  . Pulmonary hypertension ([place]) 06/18/2015  . Severe mitral regurgitation   . Chronic diastolic heart failure ([place])   . Stroke ([place]) 1997    no residual effect  . Heart murmur   . Shortness of breath dyspnea   . Constipation   . Hypertension     takes Hydralazine,Losartan,and Labetalol daily  . DM (diabetes mellitus) ([place])     takes Novolog and Metformin daily  . Chronic diastolic CHF (congestive heart failure) (HCC)     takes Furosemide daily  .  Respiratory failure, acute hypoxic, post-operative 07/08/2015    Requiring ECMO support  . S/P minimally invasive mitral valve repair 07/08/2015    Complex valvuloplasty including artificial Gore-tex neochord placement x10 and 26 mm Sorin Memo 3D Rechord ring annuloplasty via right mini thoracotomy approach  . Wears glasses     Past Surgical History  Procedure Laterality Date  . Cesarean section      x 2  . Tee without cardioversion N/A 06/08/2015     Procedure: TRANSESOPHAGEAL ECHOCARDIOGRAM (TEE);  Surgeon: Lelon Perla, MD;  Location: [facility] ENDOSCOPY;  Service: Cardiovascular;  Laterality: N/A;  . Cardiac catheterization    . Cardiac catheterization N/A 06/18/2015    Procedure: Right/Left Heart Cath and Coronary Angiography;  Surgeon: Jettie Booze, MD;  Location: [place] CV LAB;  Service: Cardiovascular;  Laterality: N/A;  . Colonoscopy    . Multiple extractions with alveoloplasty N/A 06/30/2015    Procedure: MULTIPLE EXTRACTIONS OF TOOTH #'S 4  AND 30 WITH ALVEOLOPLASTY AND GROSS DEBRIDEMENT  OF REMAINING TEETH;  Surgeon: Lenn Cal, DDS;  Location: [place];  Service: Oral Surgery;  Laterality: N/A;  . Mitral valve repair Right 07/08/2015    Procedure: MINIMALLY INVASIVE MITRAL VALVE REPAIR with a 26 Sorin Memo 3D Rechord;  Surgeon: Rexene Alberts, MD;  Location: [place];  Service: Open Heart Surgery;  Laterality: Right;  . Tee without cardioversion N/A 07/08/2015    Procedure: TRANSESOPHAGEAL ECHOCARDIOGRAM (TEE);  Surgeon: Rexene Alberts, MD;  Location: [place];  Service: Open Heart Surgery;  Laterality: N/A;  . Cannulation for cardiopulmonary bypass N/A 07/08/2015    Procedure: CANNULATION FOR ECMO;  Surgeon: Rexene Alberts, MD;  Location: [place];  Service: Open Heart Surgery;  Laterality: N/A;  . Av fistula placement Left 10/22/2015    Procedure: RADIOCEPHALIC ARTERIOVENOUS (AV) FISTULA CREATION;  Surgeon: Serafina [place], MD;  Location: [facility] OR;  Service: Vascular;  Laterality: Left;    Social History   Social History  . Marital Status: Married    Spouse Name: N/A  . Number of Children: 3  . Years of Education: N/A   Occupational History  .      Unemployed; used to work as a Radio broadcast assistant   Social History Main Topics  . Smoking status: Never Smoker   . Smokeless tobacco: Never Used  . Alcohol Use: No  . Drug Use: No  . Sexual Activity: Yes    Birth Control/ Protection: None   Other Topics Concern    . Not on file   Social History Narrative    Family History  Problem Relation Age of Onset  . Heart attack Brother     MI in his 56s  . Heart disease Brother     before age 54  . Cancer Sister     breast cancer  . Breast cancer Sister   . Stroke Mother     ROS: no fevers or chills, productive cough, hemoptysis, dysphasia, odynophagia, melena, hematochezia, dysuria, hematuria, rash, seizure activity, orthopnea, PND, pedal edema, claudication. Remaining systems are negative.  Physical Exam: Well-developed well-nourished in no acute distress.  Skin is warm and dry.  HEENT is normal.  Neck is supple.  Chest is clear to auscultation with normal expansion.  Cardiovascular exam is regular rate and rhythm.  Abdominal exam nontender or distended. No masses palpated. Extremities show no edema. neuro grossly intact

## 2015-12-14 ENCOUNTER — Ambulatory Visit (INDEPENDENT_AMBULATORY_CARE_PROVIDER_SITE_OTHER): Payer: BLUE CROSS/BLUE SHIELD | Admitting: Cardiology

## 2015-12-14 ENCOUNTER — Encounter: Payer: Self-pay | Admitting: *Deleted

## 2015-12-14 ENCOUNTER — Other Ambulatory Visit: Payer: Self-pay | Admitting: Internal Medicine

## 2015-12-14 ENCOUNTER — Encounter: Payer: Self-pay | Admitting: Cardiology

## 2015-12-14 VITALS — BP 116/84 | HR 96 | Ht 59.0 in | Wt 165.9 lb

## 2015-12-14 DIAGNOSIS — I5032 Chronic diastolic (congestive) heart failure: Secondary | ICD-10-CM

## 2015-12-14 DIAGNOSIS — E785 Hyperlipidemia, unspecified: Secondary | ICD-10-CM

## 2015-12-14 DIAGNOSIS — I429 Cardiomyopathy, unspecified: Secondary | ICD-10-CM | POA: Diagnosis not present

## 2015-12-14 DIAGNOSIS — I10 Essential (primary) hypertension: Secondary | ICD-10-CM

## 2015-12-14 DIAGNOSIS — I480 Paroxysmal atrial fibrillation: Secondary | ICD-10-CM | POA: Diagnosis not present

## 2015-12-14 DIAGNOSIS — Z9889 Other specified postprocedural states: Secondary | ICD-10-CM

## 2015-12-14 MED ORDER — CARVEDILOL 6.25 MG PO TABS: 6.2500 mg | ORAL_TABLET | Freq: Two times a day (BID) | ORAL | Status: DC

## 2015-12-14 NOTE — Assessment & Plan Note (Signed)
Patient's volume status is much improved following initiation of dialysis.

## 2015-12-14 NOTE — Assessment & Plan Note (Addendum)
Blood pressure has been low particularly on dialysis days. Discontinue hydralazine. Decrease carvedilol to 6.25 mg by mouth twice a day and do not take the a.m. Dose on dialysis days. Follow blood pressure and adjust accordingly.

## 2015-12-14 NOTE — Assessment & Plan Note (Signed)
Continue beta blocker and hydralazine. ACE inhibitors were avoided because of renal insufficiency. Repeat echocardiogram to reassess LV function.

## 2015-12-14 NOTE — Assessment & Plan Note (Signed)
Continue SBE prophylaxis. 

## 2015-12-14 NOTE — Assessment & Plan Note (Signed)
Continue statin. 

## 2015-12-14 NOTE — Patient Instructions (Signed)
Medication Instructions:   STOP HYDRALAZINE  DECREASE CARVEDILOL TO 6.25 MG TWICE DAILY= 1/2 OF THE 12.5 MG TABLET TWICE DAILY=DO NOT TAKE THE MORNING DOSE ON DIALYSIS DAYS  Testing/Procedures:  Your physician has requested that you have an echocardiogram. Echocardiography is a painless test that uses sound waves to create images of your heart. It provides your doctor with information about the size and shape of your heart and how well your heart's chambers and valves are working. This procedure takes approximately one hour. There are no restrictions for this procedure.    Follow-Up:  Your physician wants you to follow-up in: [place] will receive a reminder letter in the mail two months in advance. If you don't receive a letter, please call our office to schedule the follow-up appointment.   If you need a refill on your cardiac medications before your next appointment, please call your pharmacy.

## 2015-12-14 NOTE — Assessment & Plan Note (Signed)
Patient had postoperative atrial fibrillation. She remains in sinus rhythm on examination.

## 2015-12-15 ENCOUNTER — Encounter (HOSPITAL_COMMUNITY)
Admission: RE | Admit: 2015-12-15 | Discharge: 2015-12-15 | Disposition: A | Discharge status: Home / Self Care | Payer: BLUE CROSS/BLUE SHIELD | Source: Ambulatory Visit | Attending: Cardiology | Admitting: Cardiology

## 2015-12-15 DIAGNOSIS — Z952 Presence of prosthetic heart valve: Secondary | ICD-10-CM | POA: Diagnosis not present

## 2015-12-15 LAB — GLUCOSE, CAPILLARY
Glucose-Capillary: 105 mg/dL — ABNORMAL HIGH (ref 65–99)
Glucose-Capillary: 94 mg/dL (ref 65–99)

## 2015-12-15 NOTE — Progress Notes (Signed)
Pt arrived at cardiac rehab reporting medication changes per Dr. Stanford Breed.  Med list reconciled.  Pt is holding insulin for a few days to track trend and assess need.  Will continue to monitor.

## 2015-12-17 ENCOUNTER — Encounter: Payer: Self-pay | Admitting: Surgery

## 2015-12-17 ENCOUNTER — Encounter (HOSPITAL_COMMUNITY)
Admission: RE | Admit: 2015-12-17 | Discharge: 2015-12-17 | Disposition: A | Discharge status: Home / Self Care | Payer: BLUE CROSS/BLUE SHIELD | Source: Ambulatory Visit | Attending: Cardiology | Admitting: Cardiology

## 2015-12-17 DIAGNOSIS — Z952 Presence of prosthetic heart valve: Secondary | ICD-10-CM | POA: Diagnosis not present

## 2015-12-17 LAB — GLUCOSE, CAPILLARY
Glucose-Capillary: 94 mg/dL (ref 65–99)
Glucose-Capillary: 94 mg/dL (ref 65–99)

## 2015-12-17 NOTE — Progress Notes (Signed)
Pt brought home glucose meter to cardiac rehab today for comparison.  Home meter 104, department meter-94.  Pt reassured.  Pt has eaten light meal this am.  Pt has held her insulin this am.  Pt given lemonade.  Post exercise CBG-94 with department meter.  Will continue to monitor.

## 2015-12-20 ENCOUNTER — Encounter (HOSPITAL_COMMUNITY)
Admission: RE | Admit: 2015-12-20 | Discharge: 2015-12-20 | Disposition: A | Discharge status: Home / Self Care | Payer: BLUE CROSS/BLUE SHIELD | Source: Ambulatory Visit | Attending: Cardiology | Admitting: Cardiology

## 2015-12-20 DIAGNOSIS — Z952 Presence of prosthetic heart valve: Secondary | ICD-10-CM | POA: Diagnosis not present

## 2015-12-20 LAB — GLUCOSE, CAPILLARY
Glucose-Capillary: 98 mg/dL (ref 65–99)
Glucose-Capillary: 111 mg/dL — ABNORMAL HIGH (ref 65–99)

## 2015-12-22 ENCOUNTER — Encounter (HOSPITAL_COMMUNITY)
Admission: RE | Admit: 2015-12-22 | Discharge: 2015-12-22 | Disposition: A | Discharge status: Home / Self Care | Payer: BLUE CROSS/BLUE SHIELD | Source: Ambulatory Visit | Attending: Cardiology | Admitting: Cardiology

## 2015-12-22 DIAGNOSIS — Z952 Presence of prosthetic heart valve: Secondary | ICD-10-CM | POA: Diagnosis not present

## 2015-12-22 LAB — GLUCOSE, CAPILLARY
Glucose-Capillary: 105 mg/dL — ABNORMAL HIGH (ref 65–99)
Glucose-Capillary: 96 mg/dL (ref 65–99)

## 2015-12-24 ENCOUNTER — Encounter (HOSPITAL_COMMUNITY)
Admission: RE | Admit: 2015-12-24 | Discharge: 2015-12-24 | Disposition: A | Discharge status: Home / Self Care | Payer: BLUE CROSS/BLUE SHIELD | Source: Ambulatory Visit | Attending: Cardiology | Admitting: Cardiology

## 2015-12-24 DIAGNOSIS — Z952 Presence of prosthetic heart valve: Secondary | ICD-10-CM | POA: Diagnosis not present

## 2015-12-24 LAB — GLUCOSE, CAPILLARY
Glucose-Capillary: 100 mg/dL — ABNORMAL HIGH (ref 65–99)
Glucose-Capillary: 97 mg/dL (ref 65–99)

## 2015-12-27 ENCOUNTER — Encounter (HOSPITAL_COMMUNITY)
Admission: RE | Admit: 2015-12-27 | Discharge: 2015-12-27 | Disposition: A | Discharge status: Home / Self Care | Payer: BLUE CROSS/BLUE SHIELD | Source: Ambulatory Visit | Attending: Cardiology | Admitting: Cardiology

## 2015-12-27 ENCOUNTER — Encounter: Payer: Self-pay | Admitting: Surgery

## 2015-12-27 ENCOUNTER — Encounter: Payer: Self-pay | Admitting: *Deleted

## 2015-12-27 ENCOUNTER — Encounter (HOSPITAL_COMMUNITY): Payer: Self-pay | Admitting: *Deleted

## 2015-12-27 ENCOUNTER — Other Ambulatory Visit: Payer: Self-pay | Admitting: *Deleted

## 2015-12-27 ENCOUNTER — Ambulatory Visit (INDEPENDENT_AMBULATORY_CARE_PROVIDER_SITE_OTHER): Payer: BLUE CROSS/BLUE SHIELD | Admitting: Surgery

## 2015-12-27 ENCOUNTER — Ambulatory Visit (HOSPITAL_COMMUNITY)
Admission: RE | Admit: 2015-12-27 | Discharge: 2015-12-27 | Disposition: A | Discharge status: Home / Self Care | Payer: BLUE CROSS/BLUE SHIELD | Source: Ambulatory Visit | Attending: Surgery | Admitting: Surgery

## 2015-12-27 VITALS — BP 158/106 | HR 88 | Temp 97.6°F | Resp 18 | Ht 59.5 in | Wt 173.0 lb

## 2015-12-27 DIAGNOSIS — Z4931 Encounter for adequacy testing for hemodialysis: Secondary | ICD-10-CM | POA: Diagnosis not present

## 2015-12-27 DIAGNOSIS — Z48812 Encounter for surgical aftercare following surgery on the circulatory system: Secondary | ICD-10-CM | POA: Insufficient documentation

## 2015-12-27 DIAGNOSIS — N186 End stage renal disease: Secondary | ICD-10-CM | POA: Diagnosis not present

## 2015-12-27 DIAGNOSIS — Z952 Presence of prosthetic heart valve: Secondary | ICD-10-CM | POA: Diagnosis not present

## 2015-12-27 LAB — GLUCOSE, CAPILLARY
Glucose-Capillary: 104 mg/dL — ABNORMAL HIGH (ref 65–99)
Glucose-Capillary: 116 mg/dL — ABNORMAL HIGH (ref 65–99)

## 2015-12-27 NOTE — Progress Notes (Signed)
Anesthesia Chart Review: SAME DAY WORK-UP. Chart was given to me after 5 PM. See my note from 10/20/15. She is now scheduled for superficialization of left radiocephalic AVF by Dr. Trula Slade tomorrow. She last saw cardiologist Dr. Stanford Breed on 12/14/15 (see his note). A routine echo was ordered to follow-up her cardiomyopathy (by records, LVEF 40% at [facility] although I do not have access to the full TEE report done on 09/09/15). I don't see that the echo has been done yet. It is after 5 PM, and with surgery tomorrow, so at this point one of our phone interviewing PAT RN will contact patient to complete her PAT RN interview. Patient then will be further assessed on the day of surgery by her anesthesiologist to determine the definitive anesthesia plan.  [facility] Stay [facility]/Anesthesiology Phone [phone] 12/27/2015 5:42 PM

## 2015-12-27 NOTE — Progress Notes (Signed)
Pt arrived at [facility] rehab with BP:  170/100 pre-exercise.   Recheck Bp:  140/100.  Pt able to exercise without difficulty.  Peak exercise BP:  166/92.  Post 128/80.  Pt does admit to eating j[employer] steakhouse this weekend to celebrate granddaughters birthday. Pt took rehab report to MD at dialysis today.  Will continue to monitor.

## 2015-12-27 NOTE — Progress Notes (Signed)
Pt denies SOB and chest pain but is under the care of Dr. Stanford Breed, Cardiology. Pt stated that she is aware of her scheduled echo for tomorrow but had planned to cancel the procedure. Pt made aware to stop otc vitamins, fish oil, herbal medications and NSAID's. Pt history discussed with Ebony Hail, PA, Anesthesia, after 5:00P.M. due to late posting. Pt verbalized understanding of all pre-op instructions.

## 2015-12-27 NOTE — Progress Notes (Signed)
Patient name: Jody Nguyen MRN: [id number] DOB: 10/04/1956 Sex: female     Chief Complaint  Patient presents with  . Routine Post Op    S/P Left Radiocephalic AVF  [id number]  f/[id number]      HISTORY OF PRESENT ILLNESS:  patient is status post left radiocephalic fistula creation on 10/22/2015. She currently dialyzes on  Monday Wednesday Friday for a right-sided catheter. She does have some swelling in her left hand does not bother her.  She reports no symptoms of steal.  Past Medical History  Diagnosis Date  . HLD (hyperlipidemia)     takes Crestor daily  . Vitamin D deficiency   . CVA (cerebral infarction) 1997    no residual deficit  . Anemia   . CKD (chronic kidney disease) stage 4, GFR 15-29 ml/min (HCC)   . Diverticulitis   . History of cardiovascular stress test     a. Myoview Oct 2012 showed EF 49%, no ischemia, LVE  . Pericardial effusion     chronic; felt to be poss related to minoxidil >> DC'd  . S/P cardiac catheterization     a. R/L HC 06/18/15:  mLAD 30%; severe pulmo HTN with PA sat 43%, CI 1.86, prominent V waves indicative of MR; resting hypoxemia O2 sat 86% on RA  . Pulmonary hypertension ([place]) 06/18/2015  . Severe mitral regurgitation   . Chronic diastolic heart failure ([place])   . Stroke ([place]) 1997    no residual effect  . Heart murmur   . Shortness of breath dyspnea   . Constipation   . Hypertension     takes Hydralazine,Losartan,and Labetalol daily  . DM (diabetes mellitus) ([place])     takes Novolog and Metformin daily  . Chronic diastolic CHF (congestive heart failure) (HCC)     takes Furosemide daily  . Respiratory failure, acute hypoxic, post-operative 07/08/2015    Requiring ECMO support  . S/P minimally invasive mitral valve repair 07/08/2015    Complex valvuloplasty including artificial Gore-tex neochord placement x10 and 26 mm Sorin Memo 3D Rechord ring annuloplasty via right mini thoracotomy approach  . Wears glasses     Past Surgical History    Procedure Laterality Date  . Cesarean section      x 2  . Tee without cardioversion N/A 06/08/2015    Procedure: TRANSESOPHAGEAL ECHOCARDIOGRAM (TEE);  Surgeon: Lelon Perla, MD;  Location: [facility] ENDOSCOPY;  Service: Cardiovascular;  Laterality: N/A;  . Cardiac catheterization    . Cardiac catheterization N/A 06/18/2015    Procedure: Right/Left Heart Cath and Coronary Angiography;  Surgeon: Jettie Booze, MD;  Location: [place] CV LAB;  Service: Cardiovascular;  Laterality: N/A;  . Colonoscopy    . Multiple extractions with alveoloplasty N/A 06/30/2015    Procedure: MULTIPLE EXTRACTIONS OF TOOTH #'S 4  AND 30 WITH ALVEOLOPLASTY AND GROSS DEBRIDEMENT  OF REMAINING TEETH;  Surgeon: Lenn Cal, DDS;  Location: [place];  Service: Oral Surgery;  Laterality: N/A;  . Mitral valve repair Right 07/08/2015    Procedure: MINIMALLY INVASIVE MITRAL VALVE REPAIR with a 26 Sorin Memo 3D Rechord;  Surgeon: Rexene [place]ts, MD;  Location: [place];  Service: Open Heart Surgery;  Laterality: Right;  . Tee without cardioversion N/A 07/08/2015    Procedure: TRANSESOPHAGEAL ECHOCARDIOGRAM (TEE);  Surgeon: Rexene Alberts, MD;  Location: [place];  Service: Open Heart Surgery;  Laterality: N/A;  . Cannulation for cardiopulmonary bypass N/A 07/08/2015    Procedure: CANNULATION FOR  ECMO;  Surgeon: Rexene Alberts, MD;  Location: [place];  Service: Open Heart Surgery;  Laterality: N/A;  . Av fistula placement Left 10/22/2015    Procedure: RADIOCEPHALIC ARTERIOVENOUS (AV) FISTULA CREATION;  Surgeon: Serafina [place], MD;  Location: [facility] OR;  Service: Vascular;  Laterality: Left;    Social History   Social History  . Marital Status: Married    Spouse Name: N/A  . Number of Children: 3  . Years of Education: N/A   Occupational History  .      Unemployed; used to work as a Radio broadcast assistant   Social History Main Topics  . Smoking status: Never Smoker   . Smokeless tobacco: Never Used  . Alcohol Use:  No  . Drug Use: No  . Sexual Activity: Yes    Birth Control/ Protection: None   Other Topics Concern  . Not on file   Social History Narrative    Family History  Problem Relation Age of Onset  . Heart attack Brother     MI in his 33s  . Heart disease Brother     before age 44  . Cancer Sister     breast cancer  . Breast cancer Sister   . Stroke Mother     Allergies as of 12/27/2015 - Review Complete 12/27/2015  Allergen Reaction Noted  . Minoxidil Other (See Comments) 11/26/2014  . Lipitor [atorvastatin] Other (See Comments) 12/15/2013  . Ace inhibitors Other (See Comments) 12/15/2013    Current Outpatient Prescriptions on File Prior to Visit  Medication Sig Dispense Refill  . aspirin EC 81 MG tablet Take 81 mg by mouth daily.    . busPIRone (BUSPAR) 10 MG tablet Take 1 tablet (10 mg total) by mouth 2 (two) times daily. 180 tablet 1  . carvedilol (COREG) 6.25 MG tablet Take 1 tablet (6.25 mg total) by mouth 2 (two) times daily. 90 tablet 0  . rosuvastatin (CRESTOR) 20 MG tablet Take 1 tablet (20 mg total) by mouth daily. 90 tablet 1  . sertraline (ZOLOFT) 25 MG tablet Take 1 tablet (25 mg total) by mouth daily. 90 tablet 1  . insulin aspart protamine - aspart (NOVOLOG MIX 70/30 FLEXPEN) (70-30) 100 UNIT/ML FlexPen Inject 0.1 mLs (10 Units total) into the skin 2 (two) times daily. (Patient not taking: Reported on 12/27/2015) 15 mL 11   No current facility-administered medications on file prior to visit.       PHYSICAL EXAMINATION:   Vital signs are  Filed Vitals:   12/27/15 1117 12/27/15 1124  BP: 161/108 158/106  Pulse: 89 88  Temp: 97.6 F (36.4 C)   TempSrc: Oral   Resp: 18   Height: 4' 11.5" (1.511 m)   Weight: 173 lb (78.472 kg)   SpO2: 100%    Body mass index is 34.37 kg/(m^2). General: The patient appears their stated age.  incision is well-healed. The thrill within her fistula is easy to feel near the incision, however as you go up the arm it becomes  less prominent   Diagnostic Studies  I have reviewed her ultrasound.  The vein measures 0.49-0.59 cm.  It gets deep.  There are also several branches which are not very large.  Assessment:  non-maturing left radiocephalic fistula Plan:  I think the patient needs to undergo elevation and branch ligation.  This fistula cannot be palpated in the mid arm.  Despite the ultrasound numbers, I think elevating it to right under the skin will be beneficial  for cannulation in the future.  This will be done to ulcers are difficult to coordinate over the next several weeks  V. Leia Alf, M.D. [facility] [place] Office: [phone] Pager:  [phone]

## 2015-12-27 NOTE — Progress Notes (Signed)
Filed Vitals:   12/27/15 1117 12/27/15 1124  BP: 161/108 158/106  Pulse: 89 88  Temp: 97.6 F (36.4 C)   TempSrc: Oral   Resp: 18   Height: 4' 11.5" (1.511 m)   Weight: 173 lb (78.472 kg)   SpO2: 100%

## 2015-12-28 ENCOUNTER — Ambulatory Visit (HOSPITAL_COMMUNITY): Payer: BLUE CROSS/BLUE SHIELD | Admitting: Certified Registered Nurse Anesthetist

## 2015-12-28 ENCOUNTER — Encounter (HOSPITAL_COMMUNITY)
Admission: RE | Disposition: A | Discharge status: Home / Self Care | Payer: Self-pay | Source: Ambulatory Visit | Attending: Surgery

## 2015-12-28 ENCOUNTER — Other Ambulatory Visit (HOSPITAL_COMMUNITY): Payer: Self-pay

## 2015-12-28 ENCOUNTER — Ambulatory Visit (HOSPITAL_COMMUNITY)
Admission: RE | Admit: 2015-12-28 | Discharge: 2015-12-28 | Disposition: A | Discharge status: Home / Self Care | Payer: BLUE CROSS/BLUE SHIELD | Source: Ambulatory Visit | Attending: Surgery | Admitting: Surgery

## 2015-12-28 DIAGNOSIS — Y832 Surgical operation with anastomosis, bypass or graft as the cause of abnormal reaction of the patient, or of later complication, without mention of misadventure at the time of the procedure: Secondary | ICD-10-CM | POA: Insufficient documentation

## 2015-12-28 DIAGNOSIS — I5032 Chronic diastolic (congestive) heart failure: Secondary | ICD-10-CM | POA: Diagnosis not present

## 2015-12-28 DIAGNOSIS — I129 Hypertensive chronic kidney disease with stage 1 through stage 4 chronic kidney disease, or unspecified chronic kidney disease: Secondary | ICD-10-CM | POA: Diagnosis not present

## 2015-12-28 DIAGNOSIS — N184 Chronic kidney disease, stage 4 (severe): Secondary | ICD-10-CM | POA: Insufficient documentation

## 2015-12-28 DIAGNOSIS — Z794 Long term (current) use of insulin: Secondary | ICD-10-CM | POA: Diagnosis not present

## 2015-12-28 DIAGNOSIS — Z7982 Long term (current) use of aspirin: Secondary | ICD-10-CM | POA: Insufficient documentation

## 2015-12-28 DIAGNOSIS — T82590A Other mechanical complication of surgically created arteriovenous fistula, initial encounter: Secondary | ICD-10-CM | POA: Diagnosis present

## 2015-12-28 DIAGNOSIS — E1122 Type 2 diabetes mellitus with diabetic chronic kidney disease: Secondary | ICD-10-CM | POA: Insufficient documentation

## 2015-12-28 DIAGNOSIS — Z8673 Personal history of transient ischemic attack (TIA), and cerebral infarction without residual deficits: Secondary | ICD-10-CM | POA: Insufficient documentation

## 2015-12-28 DIAGNOSIS — Z79899 Other long term (current) drug therapy: Secondary | ICD-10-CM | POA: Diagnosis not present

## 2015-12-28 DIAGNOSIS — Z992 Dependence on renal dialysis: Secondary | ICD-10-CM | POA: Insufficient documentation

## 2015-12-28 DIAGNOSIS — T82898A Other specified complication of vascular prosthetic devices, implants and grafts, initial encounter: Secondary | ICD-10-CM | POA: Diagnosis not present

## 2015-12-28 DIAGNOSIS — E785 Hyperlipidemia, unspecified: Secondary | ICD-10-CM | POA: Insufficient documentation

## 2015-12-28 HISTORY — PX: FISTULA SUPERFICIALIZATION: SHX6341

## 2015-12-28 LAB — POCT I-STAT 4, (NA,K, GLUC, HGB,HCT)
Glucose, Bld: 116 mg/dL — ABNORMAL HIGH (ref 65–99)
HCT: 39 % (ref 36.0–46.0)
Hemoglobin: 13.3 g/dL (ref 12.0–15.0)
Potassium: 3.4 mmol/L — ABNORMAL LOW (ref 3.5–5.1)
Sodium: 138 mmol/L (ref 135–145)

## 2015-12-28 LAB — GLUCOSE, CAPILLARY: Glucose-Capillary: 98 mg/dL (ref 65–99)

## 2015-12-28 SURGERY — FISTULA SUPERFICIALIZATION
Anesthesia: Monitor Anesthesia Care | Site: Arm Lower | Laterality: Left

## 2015-12-28 MED ORDER — PROPOFOL 500 MG/50ML IV EMUL
INTRAVENOUS | Status: DC | PRN
  Administered 2015-12-28: 50 ug/kg/min via INTRAVENOUS

## 2015-12-28 MED ORDER — LIDOCAINE-EPINEPHRINE (PF) 1 %-1:200000 IJ SOLN
INTRAMUSCULAR | Status: AC
  Filled 2015-12-28: qty 30

## 2015-12-28 MED ORDER — SODIUM CHLORIDE 0.9 % IV SOLN
INTRAVENOUS | Status: DC | PRN
  Administered 2015-12-28: 12:00:00 via INTRAVENOUS

## 2015-12-28 MED ORDER — HYDROMORPHONE HCL 1 MG/ML IJ SOLN: 0.2500 mg | INTRAMUSCULAR | Status: DC | PRN

## 2015-12-28 MED ORDER — CEFUROXIME SODIUM 1.5 G IJ SOLR
1.5000 g | INTRAMUSCULAR | Status: AC
  Administered 2015-12-28: 1.5 g via INTRAVENOUS
  Filled 2015-12-28: qty 1.5

## 2015-12-28 MED ORDER — SODIUM CHLORIDE 0.9 % IV SOLN
INTRAVENOUS | Status: DC
  Administered 2015-12-28: 09:00:00 via INTRAVENOUS

## 2015-12-28 MED ORDER — MIDAZOLAM HCL 5 MG/5ML IJ SOLN
INTRAMUSCULAR | Status: DC | PRN
  Administered 2015-12-28 (×2): 1 mg via INTRAVENOUS

## 2015-12-28 MED ORDER — PROMETHAZINE HCL 25 MG/ML IJ SOLN: 6.2500 mg | INTRAMUSCULAR | Status: DC | PRN

## 2015-12-28 MED ORDER — LIDOCAINE-EPINEPHRINE (PF) 1 %-1:200000 IJ SOLN
INTRAMUSCULAR | Status: DC | PRN
  Administered 2015-12-28: 20 mL

## 2015-12-28 MED ORDER — HEPARIN SODIUM (PORCINE) 5000 UNIT/ML IJ SOLN
INTRAMUSCULAR | Status: DC | PRN
  Administered 2015-12-28: 500 mL

## 2015-12-28 MED ORDER — PHENYLEPHRINE HCL 10 MG/ML IJ SOLN
INTRAMUSCULAR | Status: DC | PRN
  Administered 2015-12-28: 120 ug via INTRAVENOUS
  Administered 2015-12-28 (×3): 80 ug via INTRAVENOUS
  Administered 2015-12-28: 40 ug via INTRAVENOUS

## 2015-12-28 MED ORDER — FENTANYL CITRATE (PF) 100 MCG/2ML IJ SOLN
INTRAMUSCULAR | Status: DC | PRN
  Administered 2015-12-28 (×2): 25 ug via INTRAVENOUS

## 2015-12-28 MED ORDER — OXYCODONE-ACETAMINOPHEN 5-325 MG PO TABS: 1.0000 | ORAL_TABLET | ORAL | Status: DC | PRN

## 2015-12-28 MED ORDER — 0.9 % SODIUM CHLORIDE (POUR BTL) OPTIME
TOPICAL | Status: DC | PRN
  Administered 2015-12-28: 1000 mL

## 2015-12-28 MED ORDER — PHENYLEPHRINE HCL 10 MG/ML IJ SOLN
10.0000 mg | INTRAVENOUS | Status: DC | PRN
  Administered 2015-12-28: 60 ug/min via INTRAVENOUS

## 2015-12-28 MED ORDER — ONDANSETRON HCL 4 MG/2ML IJ SOLN
INTRAMUSCULAR | Status: DC | PRN
  Administered 2015-12-28: 4 mg via INTRAVENOUS

## 2015-12-28 MED ORDER — LIDOCAINE HCL (CARDIAC) 20 MG/ML IV SOLN
INTRAVENOUS | Status: DC | PRN
  Administered 2015-12-28: 60 mg via INTRAVENOUS

## 2015-12-28 MED ORDER — FENTANYL CITRATE (PF) 250 MCG/5ML IJ SOLN
INTRAMUSCULAR | Status: AC
  Filled 2015-12-28: qty 5

## 2015-12-28 SURGICAL SUPPLY — 29 items
CANISTER SUCTION 2500CC (MISCELLANEOUS) ×2 IMPLANT
CLIP TI MEDIUM 6 (CLIP) ×2 IMPLANT
CLIP TI WIDE RED SMALL 6 (CLIP) ×4 IMPLANT
COVER PROBE W GEL 5X96 (DRAPES) ×2 IMPLANT
ELECT REM PT RETURN 9FT ADLT (ELECTROSURGICAL) ×2
ELECTRODE REM PT RTRN 9FT ADLT (ELECTROSURGICAL) ×1 IMPLANT
GLOVE BIOGEL PI IND STRL 6.5 (GLOVE) ×2 IMPLANT
GLOVE BIOGEL PI IND STRL 7.5 (GLOVE) ×2 IMPLANT
GLOVE BIOGEL PI INDICATOR 6.5 (GLOVE) ×2
GLOVE BIOGEL PI INDICATOR 7.5 (GLOVE) ×2
GLOVE ECLIPSE 7.0 STRL STRAW (GLOVE) ×2 IMPLANT
GLOVE SURG SS PI 7.5 STRL IVOR (GLOVE) ×2 IMPLANT
GOWN STRL REUS W/ TWL LRG LVL3 (GOWN DISPOSABLE) ×2 IMPLANT
GOWN STRL REUS W/ TWL XL LVL3 (GOWN DISPOSABLE) ×1 IMPLANT
GOWN STRL REUS W/TWL LRG LVL3 (GOWN DISPOSABLE) ×2
GOWN STRL REUS W/TWL XL LVL3 (GOWN DISPOSABLE) ×1
HEMOSTAT SNOW SURGICEL 2X4 (HEMOSTASIS) IMPLANT
KIT BASIN OR (CUSTOM PROCEDURE TRAY) ×2 IMPLANT
KIT ROOM TURNOVER OR (KITS) ×2 IMPLANT
LIQUID BAND (GAUZE/BANDAGES/DRESSINGS) ×2 IMPLANT
NS IRRIG 1000ML POUR BTL (IV SOLUTION) ×2 IMPLANT
PACK CV ACCESS (CUSTOM PROCEDURE TRAY) ×2 IMPLANT
PAD ARMBOARD 7.5X6 YLW CONV (MISCELLANEOUS) ×4 IMPLANT
SUT PROLENE 6 0 CC (SUTURE) ×2 IMPLANT
SUT VIC AB 3-0 SH 27 (SUTURE) ×2
SUT VIC AB 3-0 SH 27X BRD (SUTURE) ×2 IMPLANT
SUT VICRYL 4-0 PS2 18IN ABS (SUTURE) ×4 IMPLANT
UNDERPAD 30X30 INCONTINENT (UNDERPADS AND DIAPERS) ×2 IMPLANT
WATER STERILE IRR 1000ML POUR (IV SOLUTION) ×2 IMPLANT

## 2015-12-28 NOTE — Interval H&P Note (Signed)
History and Physical Interval Note:  12/28/2015 1:27 PM  Jody Nguyen  has presented today for surgery, with the diagnosis of end stage kidney disease on hemodialysis  The various methods of treatment have been discussed with the patient and family. After consideration of risks, benefits and other options for treatment, the patient has consented to  Procedure(s): SUPERFICIALIZATION LEFT RADIOCEPHALIC FISTULA (Left) as a surgical intervention .  The patient's history has been reviewed, patient examined, no change in status, stable for surgery.  I have reviewed the patient's chart and labs.  Questions were answered to the patient's satisfaction.     [name]

## 2015-12-28 NOTE — Op Note (Signed)
    Patient name: Jody Nguyen MRN: [id number] DOB: 1956/01/20 Sex: female  12/28/2015 Pre-operative Diagnosis: Non-maturing left radiocephalic fistula Post-operative diagnosis:  Same Surgeon:  [name] Assistants:  Gerri Lins Procedure:   Elevation of left radiocephalic fistula which branch ligation 2 Anesthesia:  Mac Blood Loss:  See anesthesia record Specimens:  None  Findings:  Vein was approximately 1 cm deep.  It was successfully elevated.  It measured approximate 5 mm  Indications:  The patient previously underwent left radiocephalic fistula.  It was difficult to palpate in the office.  Ultrasound suggested that it might be on the deep side.  She is brought back today for elevation  Procedure:  The patient was identified in the holding area and taken to [place] [facility]  The patient was then placed supine on the table. MAC anesthesia was administered.  The patient was prepped and draped in the usual sterile fashion.  A time out was called and antibiotics were administered.  2 skip incisions were made in the forearm anterior to the fistula.  The fistula was circumferentially dissected free, there were 2 large branches which were divided between silk ties.  There were other small branches that were also ligated between silk ties.  The fistulous approximately 1 cm below the skin surface.  Once it was fully mobilized, I reapproximated the subcutaneous tissue posterior to the vein with interrupted 3-0 Vicryl.  I then closed the skin anterior to the fistula running 4-0 Vicryl followed by Dermabond.  There were no immediate Locations.  There was excellent thrill within the fistula.   Disposition:  To PACU in stable condition.   Theotis Burrow, M.D. [facility] [place] Office: [phone] Pager:  [phone]

## 2015-12-28 NOTE — Transfer of Care (Signed)
Immediate Anesthesia Transfer of Care Note  Patient: Jody Nguyen  Procedure(s) Performed: Procedure(s): SUPERFICIALIZATION LEFT RADIOCEPHALIC FISTULA (Left)  Patient Location: PACU  Anesthesia Type:MAC  Level of Consciousness: awake, alert  and oriented  Airway & Oxygen Therapy: Patient Spontanous Breathing  Post-op Assessment: Report given to RN and Post -op Vital signs reviewed and stable  Post vital signs: Reviewed and stable  Last Vitals:  Filed Vitals:   12/28/15 0910 12/28/15 1339  BP: 135/90   Pulse: 91   Temp: 37 C 36.6 C  Resp: 20     Complications: No apparent anesthesia complications

## 2015-12-28 NOTE — Anesthesia Preprocedure Evaluation (Signed)
Anesthesia Evaluation  Patient identified by MRN, date of birth, ID band Patient awake    Reviewed: Allergy & Precautions, Patient's Chart, lab work & pertinent test results  History of Anesthesia Complications Negative for: history of anesthetic complications  Airway Mallampati: II  TM Distance: >3 FB Neck ROM: Full    Dental  (+) Teeth Intact, Dental Advisory Given   Pulmonary neg pulmonary ROS,    Pulmonary exam normal        Cardiovascular hypertension, Pt. on medications and Pt. on home beta blockers +CHF  Normal cardiovascular exam  a. R/L HC 06/18/15: mLAD 30%; severe pulmo HTN with PA sat 43%, CI 1.86, prominent V waves indicative of MR; resting hypoxemia O2 sat 86% on RA   Neuro/Psych CVA negative psych ROS   GI/Hepatic negative GI ROS, Neg liver ROS,   Endo/Other  diabetes  Renal/GU ESRF and DialysisRenal disease     Musculoskeletal   Abdominal   Peds  Hematology   Anesthesia Other Findings   Reproductive/Obstetrics                             Anesthesia Physical  Anesthesia Plan  ASA: III  Anesthesia Plan: MAC   Post-op Pain Management:    Induction: Intravenous  Airway Management Planned: Simple Face Mask  Additional Equipment:   Intra-op Plan:   Post-operative Plan:   Informed Consent: I have reviewed the patients History and Physical, chart, labs and discussed the procedure including the risks, benefits and alternatives for the proposed anesthesia with the patient or authorized representative who has indicated his/her understanding and acceptance.   Dental advisory given  Plan Discussed with: CRNA, Anesthesiologist and Surgeon  Anesthesia Plan Comments:         Anesthesia Quick Evaluation

## 2015-12-28 NOTE — Anesthesia Procedure Notes (Signed)
Procedure Name: MAC Date/Time: 12/28/2015 12:18 PM Performed by: [place] Pink Pre-anesthesia Checklist: Patient identified, Emergency Drugs available, Suction available, Patient being monitored and Timeout performed Patient Re-evaluated:Patient Re-evaluated prior to inductionOxygen Delivery Method: Simple face mask Intubation Type: IV induction Dental Injury: Teeth and Oropharynx as per pre-operative assessment

## 2015-12-28 NOTE — Anesthesia Postprocedure Evaluation (Signed)
Anesthesia Post Note  Patient: Jody Nguyen  Procedure(s) Performed: Procedure(s) (LRB): SUPERFICIALIZATION LEFT RADIOCEPHALIC FISTULA (Left)  Patient location during evaluation: PACU Anesthesia Type: MAC Level of consciousness: awake and alert Pain management: pain level controlled Vital Signs Assessment: post-procedure vital signs reviewed and stable Respiratory status: spontaneous breathing, nonlabored ventilation, respiratory function stable and patient connected to nasal cannula oxygen Cardiovascular status: stable and blood pressure returned to baseline Anesthetic complications: no    Last Vitals:  Filed Vitals:   12/28/15 1348 12/28/15 1413  BP: 163/71   Pulse: 83   Temp:  36.4 C  Resp: 20     Last Pain: There were no vitals filed for this visit.               Jahleah Mariscal DANIEL

## 2015-12-28 NOTE — H&P (View-Only) (Signed)
Patient name: Jody Nguyen MRN: [id number] DOB: 12/26/1955 Sex: female     Chief Complaint  Patient presents with  . Routine Post Op    S/P Left Radiocephalic AVF  [id number]  f/u      HISTORY OF PRESENT ILLNESS:  patient is status post left radiocephalic fistula creation on 10/22/2015. She currently dialyzes on  Monday Wednesday Friday for a right-sided catheter. She does have some swelling in her left hand does not bother her.  She reports no symptoms of steal.  Past Medical History  Diagnosis Date  . HLD (hyperlipidemia)     takes Crestor daily  . Vitamin D deficiency   . CVA (cerebral infarction) 1997    no residual deficit  . Anemia   . CKD (chronic kidney disease) stage 4, GFR 15-29 ml/min (HCC)   . Diverticulitis   . History of cardiovascular stress test     a. Myoview Oct 2012 showed EF 49%, no ischemia, LVE  . Pericardial effusion     chronic; felt to be poss related to minoxidil >> DC'd  . S/P cardiac catheterization     a. R/L HC 06/18/15:  mLAD 30%; severe pulmo HTN with PA sat 43%, CI 1.86, prominent V waves indicative of MR; resting hypoxemia O2 sat 86% on RA  . Pulmonary hypertension ([place]) 06/18/2015  . Severe mitral regurgitation   . Chronic diastolic heart failure ([place])   . Stroke ([place]) 1997    no residual effect  . Heart murmur   . Shortness of breath dyspnea   . Constipation   . Hypertension     takes Hydralazine,Losartan,and Labetalol daily  . DM (diabetes mellitus) ([place])     takes Novolog and Metformin daily  . Chronic diastolic CHF (congestive heart failure) (HCC)     takes Furosemide daily  . Respiratory failure, acute hypoxic, post-operative 07/08/2015    Requiring ECMO support  . S/P minimally invasive mitral valve repair 07/08/2015    Complex valvuloplasty including artificial Gore-tex neochord placement x10 and 26 mm Sorin Memo 3D Rechord ring annuloplasty via right mini thoracotomy approach  . Wears glasses     Past Surgical History    Procedure Laterality Date  . Cesarean section      x 2  . Tee without cardioversion N/A 06/08/2015    Procedure: TRANSESOPHAGEAL ECHOCARDIOGRAM (TEE);  Surgeon: Lelon Perla, MD;  Location: [facility] ENDOSCOPY;  Service: Cardiovascular;  Laterality: N/A;  . Cardiac catheterization    . Cardiac catheterization N/A 06/18/2015    Procedure: Right/Left Heart Cath and Coronary Angiography;  Surgeon: Jettie Booze, MD;  Location: [place] CV LAB;  Service: Cardiovascular;  Laterality: N/A;  . Colonoscopy    . Multiple extractions with alveoloplasty N/A 06/30/2015    Procedure: MULTIPLE EXTRACTIONS OF TOOTH #'S 4  AND 30 WITH ALVEOLOPLASTY AND GROSS DEBRIDEMENT  OF REMAINING TEETH;  Surgeon: Lenn Cal, DDS;  Location: [place];  Service: Oral Surgery;  Laterality: N/A;  . Mitral valve repair Right 07/08/2015    Procedure: MINIMALLY INVASIVE MITRAL VALVE REPAIR with a 26 Sorin Memo 3D Rechord;  Surgeon: Rexene Alberts, MD;  Location: [place];  Service: Open Heart Surgery;  Laterality: Right;  . Tee without cardioversion N/A 07/08/2015    Procedure: TRANSESOPHAGEAL ECHOCARDIOGRAM (TEE);  Surgeon: Rexene Alberts, MD;  Location: [place];  Service: Open Heart Surgery;  Laterality: N/A;  . Cannulation for cardiopulmonary bypass N/A 07/08/2015    Procedure: CANNULATION FOR  ECMO;  Surgeon: Rexene Alberts, MD;  Location: [place];  Service: Open Heart Surgery;  Laterality: N/A;  . Av fistula placement Left 10/22/2015    Procedure: RADIOCEPHALIC ARTERIOVENOUS (AV) FISTULA CREATION;  Surgeon: Serafina Mitchell, MD;  Location: [facility] OR;  Service: Vascular;  Laterality: Left;    Social History   Social History  . Marital Status: Married    Spouse Name: N/A  . Number of Children: 3  . Years of Education: N/A   Occupational History  .      Unemployed; used to work as a Radio broadcast assistant   Social History Main Topics  . Smoking status: Never Smoker   . Smokeless tobacco: Never Used  . Alcohol Use:  No  . Drug Use: No  . Sexual Activity: Yes    Birth Control/ Protection: None   Other Topics Concern  . Not on file   Social History Narrative    Family History  Problem Relation Age of Onset  . Heart attack Brother     MI in his 32s  . Heart disease Brother     before age 37  . Cancer Sister     breast cancer  . Breast cancer Sister   . Stroke Mother     Allergies as of 12/27/2015 - Review Complete 12/27/2015  Allergen Reaction Noted  . Minoxidil Other (See Comments) 11/26/2014  . Lipitor [atorvastatin] Other (See Comments) 12/15/2013  . Ace inhibitors Other (See Comments) 12/15/2013    Current Outpatient Prescriptions on File Prior to Visit  Medication Sig Dispense Refill  . aspirin EC 81 MG tablet Take 81 mg by mouth daily.    . busPIRone (BUSPAR) 10 MG tablet Take 1 tablet (10 mg total) by mouth 2 (two) times daily. 180 tablet 1  . carvedilol (COREG) 6.25 MG tablet Take 1 tablet (6.25 mg total) by mouth 2 (two) times daily. 90 tablet 0  . rosuvastatin (CRESTOR) 20 MG tablet Take 1 tablet (20 mg total) by mouth daily. 90 tablet 1  . sertraline (ZOLOFT) 25 MG tablet Take 1 tablet (25 mg total) by mouth daily. 90 tablet 1  . insulin aspart protamine - aspart (NOVOLOG MIX 70/30 FLEXPEN) (70-30) 100 UNIT/ML FlexPen Inject 0.1 mLs (10 Units total) into the skin 2 (two) times daily. (Patient not taking: Reported on 12/27/2015) 15 mL 11   No current facility-administered medications on file prior to visit.       PHYSICAL EXAMINATION:   Vital signs are  Filed Vitals:   12/27/15 1117 12/27/15 1124  BP: 161/108 158/106  Pulse: 89 88  Temp: 97.6 F (36.4 C)   TempSrc: Oral   Resp: 18   Height: 4' 11.5" (1.511 m)   Weight: 173 lb (78.472 kg)   SpO2: 100%    Body mass index is 34.37 kg/(m^2). General: The patient appears their stated age.  incision is well-healed. The thrill within her fistula is easy to feel near the incision, however as you go up the arm it becomes  less prominent   Diagnostic Studies  I have reviewed her ultrasound.  The vein measures 0.49-0.59 cm.  It gets deep.  There are also several branches which are not very large.  Assessment:  non-maturing left radiocephalic fistula Plan:  I think the patient needs to undergo elevation and branch ligation.  This fistula cannot be palpated in the mid arm.  Despite the ultrasound numbers, I think elevating it to right under the skin will be beneficial  for cannulation in the future.  This will be done to ulcers are difficult to coordinate over the next several weeks  V. Leia Alf, M.D. [facility] Office: [phone] Pager:  [phone]

## 2015-12-29 ENCOUNTER — Encounter (HOSPITAL_COMMUNITY): Payer: BLUE CROSS/BLUE SHIELD

## 2015-12-29 ENCOUNTER — Encounter (HOSPITAL_COMMUNITY): Payer: Self-pay | Admitting: Surgery

## 2015-12-29 DIAGNOSIS — Z952 Presence of prosthetic heart valve: Secondary | ICD-10-CM | POA: Insufficient documentation

## 2015-12-30 ENCOUNTER — Telehealth: Payer: Self-pay | Admitting: Surgery

## 2015-12-30 NOTE — Telephone Encounter (Signed)
LM for pt re appt, dpm °

## 2015-12-30 NOTE — Telephone Encounter (Signed)
-----   Message from Mena Goes, RN sent at 12/28/2015  3:14 PM EST ----- Regarding: schedule   ----- Message -----    From: Ulyses Amor, PA-C    Sent: 12/28/2015   1:26 PM      To: Vvs Charge Pool  F/U in the office with Dr. Trula Slade in 4 weeks s/p left forearm supervascularization

## 2015-12-31 ENCOUNTER — Telehealth (HOSPITAL_COMMUNITY): Payer: Self-pay | Admitting: *Deleted

## 2015-12-31 ENCOUNTER — Encounter (HOSPITAL_COMMUNITY): Admission: RE | Admit: 2015-12-31 | Payer: BLUE CROSS/BLUE SHIELD | Source: Ambulatory Visit

## 2016-01-03 ENCOUNTER — Encounter (HOSPITAL_COMMUNITY)
Admission: RE | Admit: 2016-01-03 | Discharge: 2016-01-03 | Disposition: A | Discharge status: Home / Self Care | Payer: BLUE CROSS/BLUE SHIELD | Source: Ambulatory Visit | Attending: Cardiology | Admitting: Cardiology

## 2016-01-03 ENCOUNTER — Encounter: Payer: Self-pay | Admitting: Thoracic Surgery (Cardiothoracic Vascular Surgery)

## 2016-01-03 DIAGNOSIS — Z952 Presence of prosthetic heart valve: Secondary | ICD-10-CM | POA: Diagnosis not present

## 2016-01-03 LAB — GLUCOSE, CAPILLARY: Glucose-Capillary: 113 mg/dL — ABNORMAL HIGH (ref 65–99)

## 2016-01-05 ENCOUNTER — Encounter (HOSPITAL_COMMUNITY)
Admission: RE | Admit: 2016-01-05 | Discharge: 2016-01-05 | Disposition: A | Discharge status: Home / Self Care | Payer: BLUE CROSS/BLUE SHIELD | Source: Ambulatory Visit | Attending: Cardiology | Admitting: Cardiology

## 2016-01-05 DIAGNOSIS — Z952 Presence of prosthetic heart valve: Secondary | ICD-10-CM | POA: Diagnosis not present

## 2016-01-05 LAB — GLUCOSE, CAPILLARY: Glucose-Capillary: 116 mg/dL — ABNORMAL HIGH (ref 65–99)

## 2016-01-07 ENCOUNTER — Ambulatory Visit (INDEPENDENT_AMBULATORY_CARE_PROVIDER_SITE_OTHER): Payer: BLUE CROSS/BLUE SHIELD | Admitting: Internal Medicine

## 2016-01-07 ENCOUNTER — Encounter (HOSPITAL_COMMUNITY)
Admission: RE | Admit: 2016-01-07 | Discharge: 2016-01-07 | Disposition: A | Discharge status: Home / Self Care | Payer: BLUE CROSS/BLUE SHIELD | Source: Ambulatory Visit | Attending: Cardiology | Admitting: Cardiology

## 2016-01-07 ENCOUNTER — Encounter: Payer: Self-pay | Admitting: Internal Medicine

## 2016-01-07 VITALS — BP 130/74 | HR 92 | Temp 98.0°F | Ht 60.0 in | Wt 170.0 lb

## 2016-01-07 DIAGNOSIS — Z952 Presence of prosthetic heart valve: Secondary | ICD-10-CM | POA: Diagnosis not present

## 2016-01-07 DIAGNOSIS — I10 Essential (primary) hypertension: Secondary | ICD-10-CM

## 2016-01-07 LAB — GLUCOSE, CAPILLARY: Glucose-Capillary: 97 mg/dL (ref 65–99)

## 2016-01-07 MED ORDER — CARVEDILOL 6.25 MG PO TABS: 6.2500 mg | ORAL_TABLET | Freq: Two times a day (BID) | ORAL | Status: DC

## 2016-01-07 NOTE — Progress Notes (Signed)
Patient ID: Jody Nguyen, female   DOB: [birth year], 60 y.o.   MRN: [id number]  Assessment and Plan:  Hypertension:  -refill of carvedilol sent in -Continue medication -monitor blood pressure at home. -Continue DASH diet -Reminder to go to the ER if any CP, SOB, nausea, dizziness, severe HA, changes vision/speech, left arm numbness and tingling and jaw pain.  Cholesterol - Continue diet and exercise -due for repeat echo  Diabetes with diabetic chronic kidney disease -Continue diet and exercise.  -A1C checked at [facility] 6.1  Vitamin D Def -check level -continue medications.     Continue diet and meds as discussed. Further disposition pending results of labs. Discussed med's effects and SE's.    HPI 60 y.o. female  presents for 3 month follow up with hypertension, hyperlipidemia, diabetes and vitamin D deficiency.   Her blood pressure has been controlled at home, today their BP is BP: 130/74 mmHg.She does workout. She denies chest pain, shortness of breath, dizziness.   She is on cholesterol medication and denies myalgias. Her cholesterol is at goal. The cholesterol was:  10/07/2015: Cholesterol 165; HDL 47; LDL Cholesterol 76; Triglycerides 210*   She has been working on diet and exercise for diabetes with diabetic chronic kidney disease, she is on bASA, she is on ACE/ARB, and denies  foot ulcerations, hyperglycemia, hypoglycemia , increased appetite, nausea, paresthesia of the feet, polydipsia, polyuria, visual disturbances, vomiting and weight loss. Last A1C was: 10/07/2015: Hgb A1c MFr Bld 5.5   Patient is on Vitamin D supplement. 10/07/2015: Vit D, 25-Hydroxy 54  She is still doing cardiac rehab until March.  She also reports that she is seeing the kidney transplant team at [facility] to see if she meets qualifications up there.    Current Medications:  Current Outpatient Prescriptions on File Prior to Visit  Medication Sig Dispense Refill  . aspirin EC 81 MG tablet Take 81 mg by  mouth daily.    . busPIRone (BUSPAR) 10 MG tablet Take 1 tablet (10 mg total) by mouth 2 (two) times daily. 180 tablet 1  . carvedilol (COREG) 6.25 MG tablet Take 1 tablet (6.25 mg total) by mouth 2 (two) times daily. (Patient taking differently: Take 3.125 mg by mouth 2 (two) times daily. Only takes on Sun, Tues, Thurs, and Sat - does not take on dialysis days) 90 tablet 0  . insulin aspart protamine - aspart (NOVOLOG MIX 70/30 FLEXPEN) (70-30) 100 UNIT/ML FlexPen Inject 0.1 mLs (10 Units total) into the skin 2 (two) times daily. (Patient taking differently: Inject 4 Units into the skin 2 (two) times daily as needed (If sugar goes above 180). ) 15 mL 11  . RENVELA 800 MG tablet Take 2,400 mg by mouth 3 (three) times daily. 3 caps with meals - 1 cap with snacks  5  . rosuvastatin (CRESTOR) 20 MG tablet Take 1 tablet (20 mg total) by mouth daily. 90 tablet 1  . sertraline (ZOLOFT) 25 MG tablet Take 1 tablet (25 mg total) by mouth daily. 90 tablet 1   No current facility-administered medications on file prior to visit.   Medical History:  Past Medical History  Diagnosis Date  . HLD (hyperlipidemia)     takes Crestor daily  . Vitamin D deficiency   . CVA (cerebral infarction) 1997    no residual deficit  . Anemia   . CKD (chronic kidney disease) stage 4, GFR 15-29 ml/min (HCC)   . Diverticulitis   . History of cardiovascular stress test  a. Myoview Oct 2012 showed EF 49%, no ischemia, LVE  . Pericardial effusion     chronic; felt to be poss related to minoxidil >> DC'd  . S/P cardiac catheterization     a. R/L HC 06/18/15:  mLAD 30%; severe pulmo HTN with PA sat 43%, CI 1.86, prominent V waves indicative of MR; resting hypoxemia O2 sat 86% on RA  . Pulmonary hypertension ([place]) 06/18/2015  . Severe mitral regurgitation   . Chronic diastolic heart failure ([employer])   . Stroke ([place]) 1997    no residual effect  . Heart murmur   . Shortness of breath dyspnea   . Constipation   . Hypertension      takes Hydralazine,Losartan,and Labetalol daily  . DM (diabetes mellitus) ([place])     takes Novolog and Metformin daily  . Chronic diastolic CHF (congestive heart failure) (HCC)     takes Furosemide daily  . Respiratory failure, acute hypoxic, post-operative 07/08/2015    Requiring ECMO support  . S/P minimally invasive mitral valve repair 07/08/2015    Complex valvuloplasty including artificial Gore-tex neochord placement x10 and 26 mm Sorin Memo 3D Rechord ring annuloplasty via right mini thoracotomy approach  . Wears glasses    Allergies:  Allergies  Allergen Reactions  . Minoxidil Other (See Comments)    Pericardial effusion  . Lipitor [Atorvastatin] Other (See Comments)    REACTION: "pain in legs" Tolerates rosuvastatin  . Ace Inhibitors Other (See Comments)    REACTION: "not sure...think it made me drowsy all the time"     Review of Systems:  Review of Systems  Constitutional: Negative for fever, chills and malaise/fatigue.  HENT: Negative for congestion, ear pain and sore throat.   Eyes: Negative.   Respiratory: Negative for cough, shortness of breath and wheezing.   Cardiovascular: Negative for chest pain, palpitations and leg swelling.  Gastrointestinal: Negative for heartburn, abdominal pain, diarrhea, constipation, blood in stool and melena.  Genitourinary: Negative.   Neurological: Negative for dizziness, sensory change, loss of consciousness and headaches.  Psychiatric/Behavioral: Negative for depression. The patient is not nervous/anxious and does not have insomnia.     Family history- Review and unchanged  Social history- Review and unchanged  Physical Exam: BP 130/74 mmHg  Pulse 92  Temp(Src) 98 F (36.7 C) (Temporal)  Ht 5' (1.524 m)  Wt 170 lb (77.111 kg)  BMI 33.20 kg/m2 Wt Readings from Last 3 Encounters:  01/07/16 170 lb (77.111 kg)  12/28/15 173 lb (78.472 kg)  12/27/15 173 lb (78.472 kg)   General Appearance: Well nourished well developed,  non-toxic appearing, in no apparent distress. Eyes: PERRLA, EOMs, conjunctiva no swelling or erythema ENT/Mouth: Ear canals clear with no erythema, swelling, or discharge.  TMs normal bilaterally, oropharynx clear, moist, with no exudate.   Neck: Supple, thyroid normal, no JVD, no cervical adenopathy.  Respiratory: Respiratory effort normal, breath sounds clear A&P, no wheeze, rhonchi or rales noted.  No retractions, no accessory muscle usage Cardio: RRR with no RGs. No noted edema. 2/6 murmur heard on LSB Palpable thrill in left forearm over well healing incision  Abdomen: Soft, + BS.  Non tender, no guarding, rebound, hernias, masses. Musculoskeletal: Full ROM, 5/5 strength, Normal gait Skin: Warm, dry without rashes, lesions, ecchymosis.  Neuro: Awake and oriented X 3, Cranial nerves intact. No cerebellar symptoms.  Psych: normal affect, Insight and Judgment appropriate.    Starlyn Skeans, PA-C 10:19 AM [facility]

## 2016-01-10 ENCOUNTER — Encounter: Payer: Self-pay | Admitting: Thoracic Surgery (Cardiothoracic Vascular Surgery)

## 2016-01-10 ENCOUNTER — Ambulatory Visit (INDEPENDENT_AMBULATORY_CARE_PROVIDER_SITE_OTHER): Payer: BLUE CROSS/BLUE SHIELD | Admitting: Thoracic Surgery (Cardiothoracic Vascular Surgery)

## 2016-01-10 ENCOUNTER — Encounter (HOSPITAL_COMMUNITY)
Admission: RE | Admit: 2016-01-10 | Discharge: 2016-01-10 | Disposition: A | Discharge status: Home / Self Care | Payer: BLUE CROSS/BLUE SHIELD | Source: Ambulatory Visit | Attending: Cardiology | Admitting: Cardiology

## 2016-01-10 VITALS — BP 136/80 | HR 96 | Resp 20 | Ht 60.0 in | Wt 174.0 lb

## 2016-01-10 DIAGNOSIS — I340 Nonrheumatic mitral (valve) insufficiency: Secondary | ICD-10-CM

## 2016-01-10 DIAGNOSIS — Z9889 Other specified postprocedural states: Secondary | ICD-10-CM

## 2016-01-10 DIAGNOSIS — Z952 Presence of prosthetic heart valve: Secondary | ICD-10-CM | POA: Diagnosis not present

## 2016-01-10 DIAGNOSIS — J9601 Acute respiratory failure with hypoxia: Secondary | ICD-10-CM

## 2016-01-10 LAB — GLUCOSE, CAPILLARY: Glucose-Capillary: 99 mg/dL (ref 65–99)

## 2016-01-10 NOTE — Progress Notes (Signed)
[address]       [place] [address]             [phone]     CARDIOTHORACIC SURGERY OFFICE NOTE  Referring Provider is Nguyen, Jody Bors, MD PCP is Jody Richards, MD   HPI:  Patient is [name] with multiple medical problems who returns to our office today for routine follow-up approximately 6 months status post minimally invasive mitral valve repair.  Her surgical procedure was complicated by the development of profound acute hypoxemic respiratory failure secondary to acute unilateral lung injury and pulmonary edema that failed attempts at management using conventional ventilatory support and ultimately required extracorporeal membrane oxygenator support.  She was transferred to [facility] where she gradually recovered over a two-month period of time. She had history of stage III chronic kidney disease prior to surgery and she subsequently developed acute renal failure requiring initiation of hemodialysis. Echocardiogram performed during her hospitalization at [facility] by report revealed intact mitral valve repair with trivial residual mitral regurgitation and mild left ventricular systolic dysfunction, ejection fraction estimated 50%.  She was eventually discharged home and she was seen most recently in our office on 10/05/2015.  She has been seen in follow-up on several occasions by Dr. Stanford Nguyen, most recently on 12/14/2015. She has been participating in the outpatient cardiac rehabilitation program and making steady progress. She has remained dialysis dependent, using a tunneled hemodialysis catheter that was implanted at [facility]. She has undergone placement of a left forearm AV fistula but this has not matured and required revision recently.  She has recently been evaluated at [facility] for consideration of possible renal transplantation in the future.  She returns to our office for routine follow-up today. She states  that she has continued to gradually improve over the last several months. She still struggles on dialysis days. She has problems with drops in her blood pressure during and following dialysis treatment, and this at times causes cramping and is almost always associated with fatigue. She states that on days that she does not go to dialysis she overall feels quite well. Her strength and exercise tolerance has continued to improve. She denies problems with exertional shortness of breath, PND, orthopnea, or lower extremity edema.   Current Outpatient Prescriptions  Medication Sig Dispense Refill  . aspirin EC 81 MG tablet Take 81 mg by mouth daily.    . busPIRone (BUSPAR) 10 MG tablet Take 1 tablet (10 mg total) by mouth 2 (two) times daily. 180 tablet 1  . carvedilol (COREG) 6.25 MG tablet Take 1 tablet (6.25 mg total) by mouth 2 (two) times daily. 90 tablet 0  . insulin aspart protamine - aspart (NOVOLOG MIX 70/30 FLEXPEN) (70-30) 100 UNIT/ML FlexPen Inject 0.1 mLs (10 Units total) into the skin 2 (two) times daily. (Patient taking differently: Inject 4 Units into the skin 2 (two) times daily as needed (If sugar goes above 180). ) 15 mL 11  . RENVELA 800 MG tablet Take 2,400 mg by mouth 3 (three) times daily. 3 caps with meals - 1 cap with snacks  5  . rosuvastatin (CRESTOR) 20 MG tablet Take 1 tablet (20 mg total) by mouth daily. 90 tablet 1  . sertraline (ZOLOFT) 25 MG tablet Take 1 tablet (25 mg total) by mouth daily. 90 tablet 1   No current facility-administered medications for this visit.      Physical Exam:   BP 136/80 mmHg  Pulse 96  Resp 20  Ht 5' (1.524 m)  Wt 174 lb (78.926 kg)  BMI 33.98 kg/m2  SpO2 95%  General:  Obese but well appearing  Chest:   Clear to auscultation  CV:   Regular rate and rhythm without murmur  Incisions:  Completely healed  Abdomen:  Soft and nontender  Extremities:  Warm and well-perfused, left forearm is swollen  Diagnostic  Tests:  n/a   Impression:  Patient is currently doing fairly well approximately 6 months status post minimally invasive mitral valve repair. Her surgical procedure was initially complicated by profound unilateral acute lung injury and pulmonary edema that required ECMO support and a somewhat prolonged hospitalization. She has remained in dialysis-dependent renal failure ever since.  She otherwise appears to be doing reasonably well under the circumstances.  She has not yet had a follow-up echocardiogram performed since the echocardiogram that was done at [facility] during her early postoperative convalescence.   Plan:  We have not recommended any changes to the patient's current medications or treatment at this time.  The patient has been reminded regarding the importance of dental hygiene and the lifelong need for antibiotic prophylaxis for all dental cleanings and other related invasive procedures.  Implications regarding the patient's dialysis access have been discussed. All of their questions have been addressed.  The patient will contact Dr. Jacalyn Nguyen office to reschedule the echocardiogram he had ordered previously. This echocardiogram did not get done because of problems around the patient's dialysis schedule.  She will continue to follow-up closely with Dr. Stanford Nguyen and return to see [place] for routine follow-up next August, approximately 1 year following her original surgery.   I spent in excess of 15 minutes during the conduct of this office consultation and >50% of this time involved direct face-to-face encounter with the patient for counseling and/or coordination of their care.   Jody Gu. Roxy Manns, MD 01/10/2016 3:55 PM

## 2016-01-10 NOTE — Patient Instructions (Signed)

## 2016-01-12 ENCOUNTER — Encounter (HOSPITAL_COMMUNITY)
Admission: RE | Admit: 2016-01-12 | Discharge: 2016-01-12 | Disposition: A | Discharge status: Home / Self Care | Payer: BLUE CROSS/BLUE SHIELD | Source: Ambulatory Visit | Attending: Cardiology | Admitting: Cardiology

## 2016-01-12 DIAGNOSIS — Z952 Presence of prosthetic heart valve: Secondary | ICD-10-CM | POA: Diagnosis not present

## 2016-01-12 LAB — GLUCOSE, CAPILLARY: Glucose-Capillary: 117 mg/dL — ABNORMAL HIGH (ref 65–99)

## 2016-01-12 NOTE — Addendum Note (Signed)
Addendum  created 01/12/16 1656 by Nolon Nations, MD   Modules edited: Anesthesia Events, Anesthesia Flowsheet, Narrator, Narrator Events   Narrator:  Narrator: Event Log Edited   Narrator Events:  Delete Quick Note event

## 2016-01-14 ENCOUNTER — Encounter (HOSPITAL_COMMUNITY)
Admission: RE | Admit: 2016-01-14 | Discharge: 2016-01-14 | Disposition: A | Discharge status: Home / Self Care | Payer: BLUE CROSS/BLUE SHIELD | Source: Ambulatory Visit | Attending: Cardiology | Admitting: Cardiology

## 2016-01-14 ENCOUNTER — Encounter: Payer: Self-pay | Admitting: Surgery

## 2016-01-14 DIAGNOSIS — Z952 Presence of prosthetic heart valve: Secondary | ICD-10-CM | POA: Diagnosis not present

## 2016-01-14 NOTE — Progress Notes (Signed)
90 day psychosocial reassessment  No psychosocial needs identfied, no interventions necessary.  Will continue to monitor.   Pt is exercising on her own at home walking and going to gym with her sister. Pt was able to drive herself to cardiac rehab today which was first time since her surgery. Pt is gaining independence.  Pt is encouraged and hopeful about her progress.

## 2016-01-17 ENCOUNTER — Encounter (HOSPITAL_COMMUNITY)
Admission: RE | Admit: 2016-01-17 | Discharge: 2016-01-17 | Disposition: A | Discharge status: Home / Self Care | Payer: BLUE CROSS/BLUE SHIELD | Source: Ambulatory Visit | Attending: Cardiology | Admitting: Cardiology

## 2016-01-17 DIAGNOSIS — Z952 Presence of prosthetic heart valve: Secondary | ICD-10-CM | POA: Diagnosis not present

## 2016-01-19 ENCOUNTER — Encounter (HOSPITAL_COMMUNITY)
Admission: RE | Admit: 2016-01-19 | Discharge: 2016-01-19 | Disposition: A | Discharge status: Home / Self Care | Payer: BLUE CROSS/BLUE SHIELD | Source: Ambulatory Visit | Attending: Cardiology | Admitting: Cardiology

## 2016-01-19 DIAGNOSIS — Z952 Presence of prosthetic heart valve: Secondary | ICD-10-CM | POA: Diagnosis not present

## 2016-01-21 ENCOUNTER — Encounter (HOSPITAL_COMMUNITY)
Admission: RE | Admit: 2016-01-21 | Discharge: 2016-01-21 | Disposition: A | Discharge status: Home / Self Care | Payer: BLUE CROSS/BLUE SHIELD | Source: Ambulatory Visit | Attending: Cardiology | Admitting: Cardiology

## 2016-01-21 DIAGNOSIS — Z952 Presence of prosthetic heart valve: Secondary | ICD-10-CM | POA: Diagnosis not present

## 2016-01-24 ENCOUNTER — Encounter (HOSPITAL_COMMUNITY)
Admission: RE | Admit: 2016-01-24 | Discharge: 2016-01-24 | Disposition: A | Discharge status: Home / Self Care | Payer: BLUE CROSS/BLUE SHIELD | Source: Ambulatory Visit | Attending: Cardiology | Admitting: Cardiology

## 2016-01-24 ENCOUNTER — Encounter: Payer: Self-pay | Admitting: Surgery

## 2016-01-24 ENCOUNTER — Ambulatory Visit (INDEPENDENT_AMBULATORY_CARE_PROVIDER_SITE_OTHER): Payer: Self-pay | Admitting: Surgery

## 2016-01-24 VITALS — BP 150/100 | HR 92 | Temp 98.2°F | Resp 18 | Ht 59.0 in | Wt 178.0 lb

## 2016-01-24 DIAGNOSIS — N186 End stage renal disease: Secondary | ICD-10-CM

## 2016-01-24 DIAGNOSIS — Z952 Presence of prosthetic heart valve: Secondary | ICD-10-CM | POA: Diagnosis not present

## 2016-01-24 NOTE — Progress Notes (Signed)
Patient name: Jody Nguyen MRN: [id number] DOB: 02/05/1956 Sex: female     Chief Complaint  Patient presents with  . Routine Post Op    S/P Left forearm super. 12-28-15   C/O Left arm swelling  Axillary to fingers, cold fingers on left hand.    HISTORY OF PRESENT ILLNESS:  the patient is back for follow-up.  She initially underwent a left radiocephalic fistula creation on 10/22/2015. On 12/28/2015 she was taken back for elevation of her fistula with branch ligation 2.  She is here for follow-up.  She continues to have swelling in her left arm that does not bother her.  She is dialyzing through a right-sided catheter.  She does not have any pain in her hand  Past Medical History  Diagnosis Date  . HLD (hyperlipidemia)     takes Crestor daily  . Vitamin D deficiency   . CVA (cerebral infarction) 1997    no residual deficit  . Anemia   . CKD (chronic kidney disease) stage 4, GFR 15-29 ml/min (HCC)   . Diverticulitis   . History of cardiovascular stress test     a. Myoview Oct 2012 showed EF 49%, no ischemia, LVE  . Pericardial effusion     chronic; felt to be poss related to minoxidil >> DC'd  . S/P cardiac catheterization     a. R/L HC 06/18/15:  mLAD 30%; severe pulmo HTN with PA sat 43%, CI 1.86, prominent V waves indicative of MR; resting hypoxemia O2 sat 86% on RA  . Pulmonary hypertension ([place]) 06/18/2015  . Severe mitral regurgitation   . Chronic diastolic heart failure ([place])   . Stroke ([place]) 1997    no residual effect  . Heart murmur   . Shortness of breath dyspnea   . Constipation   . Hypertension     takes Hydralazine,Losartan,and Labetalol daily  . DM (diabetes mellitus) ([place])     takes Novolog and Metformin daily  . Chronic diastolic CHF (congestive heart failure) (HCC)     takes Furosemide daily  . Respiratory failure, acute hypoxic, post-operative 07/08/2015    Requiring ECMO support  . S/P minimally invasive mitral valve repair 07/08/2015    Complex  valvuloplasty including artificial Gore-tex neochord placement x10 and 26 mm Sorin Memo 3D Rechord ring annuloplasty via right mini thoracotomy approach  . Wears glasses     Past Surgical History  Procedure Laterality Date  . Cesarean section      x 2  . Tee without cardioversion N/A 06/08/2015    Procedure: TRANSESOPHAGEAL ECHOCARDIOGRAM (TEE);  Surgeon: Lelon Perla, MD;  Location: [facility] ENDOSCOPY;  Service: Cardiovascular;  Laterality: N/A;  . Cardiac catheterization    . Cardiac catheterization N/A 06/18/2015    Procedure: Right/Left Heart Cath and Coronary Angiography;  Surgeon: Jettie Booze, MD;  Location: [place] CV LAB;  Service: Cardiovascular;  Laterality: N/A;  . Colonoscopy    . Multiple extractions with alveoloplasty N/A 06/30/2015    Procedure: MULTIPLE EXTRACTIONS OF TOOTH #'S 4  AND 30 WITH ALVEOLOPLASTY AND GROSS DEBRIDEMENT  OF REMAINING TEETH;  Surgeon: Lenn Cal, DDS;  Location: [place];  Service: Oral Surgery;  Laterality: N/A;  . Mitral valve repair Right 07/08/2015    Procedure: MINIMALLY INVASIVE MITRAL VALVE REPAIR with a 26 Sorin Memo 3D Rechord;  Surgeon: Rexene Alberts, MD;  Location: [place];  Service: Open Heart Surgery;  Laterality: Right;  . Tee without cardioversion N/A 07/08/2015  Procedure: TRANSESOPHAGEAL ECHOCARDIOGRAM (TEE);  Surgeon: Rexene Alberts, MD;  Location: [place];  Service: Open Heart Surgery;  Laterality: N/A;  . Cannulation for cardiopulmonary bypass N/A 07/08/2015    Procedure: CANNULATION FOR ECMO;  Surgeon: Rexene Alberts, MD;  Location: [place];  Service: Open Heart Surgery;  Laterality: N/A;  . Av fistula placement Left 10/22/2015    Procedure: RADIOCEPHALIC ARTERIOVENOUS (AV) FISTULA CREATION;  Surgeon: Serafina Mitchell, MD;  Location: [place];  Service: Vascular;  Laterality: Left;  . Fistula superficialization Left 12/28/2015    Procedure: SUPERFICIALIZATION LEFT RADIOCEPHALIC FISTULA;  Surgeon: Serafina Mitchell, MD;  Location:  [facility] OR;  Service: Vascular;  Laterality: Left;    Social History   Social History  . Marital Status: Married    Spouse Name: N/A  . Number of Children: 3  . Years of Education: N/A   Occupational History  .      Unemployed; used to work as a Radio broadcast assistant   Social History Main Topics  . Smoking status: Never Smoker   . Smokeless tobacco: Never Used  . Alcohol Use: No  . Drug Use: No  . Sexual Activity: Yes    Birth Control/ Protection: None   Other Topics Concern  . Not on file   Social History Narrative    Family History  Problem Relation Age of Onset  . Heart attack Brother     MI in his 49s  . Heart disease Brother     before age 44  . Cancer Sister     breast cancer  . Breast cancer Sister   . Stroke Mother     Allergies as of 01/24/2016 - Review Complete 01/24/2016  Allergen Reaction Noted  . Minoxidil Other (See Comments) 11/26/2014  . Lipitor [atorvastatin] Other (See Comments) 12/15/2013  . Ace inhibitors Other (See Comments) 12/15/2013    Current Outpatient Prescriptions on File Prior to Visit  Medication Sig Dispense Refill  . aspirin EC 81 MG tablet Take 81 mg by mouth daily.    . busPIRone (BUSPAR) 10 MG tablet Take 1 tablet (10 mg total) by mouth 2 (two) times daily. 180 tablet 1  . carvedilol (COREG) 6.25 MG tablet Take 1 tablet (6.25 mg total) by mouth 2 (two) times daily. 90 tablet 0  . insulin aspart protamine - aspart (NOVOLOG MIX 70/30 FLEXPEN) (70-30) 100 UNIT/ML FlexPen Inject 0.1 mLs (10 Units total) into the skin 2 (two) times daily. (Patient taking differently: Inject 4 Units into the skin 2 (two) times daily as needed (If sugar goes above 180). ) 15 mL 11  . RENVELA 800 MG tablet Take 2,400 mg by mouth 3 (three) times daily. 3 caps with meals - 1 cap with snacks  5  . rosuvastatin (CRESTOR) 20 MG tablet Take 1 tablet (20 mg total) by mouth daily. 90 tablet 1  . sertraline (ZOLOFT) 25 MG tablet Take 1 tablet (25 mg total) by  mouth daily. 90 tablet 1   No current facility-administered medications on file prior to visit.      PHYSICAL EXAMINATION:   Vital signs are  Filed Vitals:   01/24/16 1023 01/24/16 1031  BP: 175/100 150/100  Pulse: 94 92  Temp: 98.2 F (36.8 C)   TempSrc: Oral   Resp: 18   Height: 4\' 11"  (1.499 m)   Weight: 178 lb (80.74 kg)   SpO2: 97%    Body mass index is 35.93 kg/(m^2). General: The patient appears their stated  age.  excellent thrill within left radiocephalic fistula.  There is slight skin separation at the most proximal aspect of the incision near the antecubital crease.  There is no vein exposed.  There is no erythema.  There is no drainage.   Assessment:  end-stage renal disease Plan:  I discussed with the patient that her fistula would be ready for use in approximately 1 week.  I would not cannulate the fistula around the incision closest to the antecubital crease until the incision has completely healed.  The more distal aspect of the forearm can be cannulated in a week.  The patient will contact me if her swelling persists.  I would like to perform a fistulogram if she continues to have swelling.  She'll also contact me if the incision has not healed within 4 weeks.  Otherwise she will follow up on an as-needed basis.  Eldridge Abrahams, M.D. [facility] [place] Office: [phone] Pager:  [phone]

## 2016-01-26 ENCOUNTER — Encounter (HOSPITAL_COMMUNITY)
Admission: RE | Admit: 2016-01-26 | Discharge: 2016-01-26 | Disposition: A | Discharge status: Home / Self Care | Payer: BLUE CROSS/BLUE SHIELD | Source: Ambulatory Visit | Attending: Cardiology | Admitting: Cardiology

## 2016-01-26 DIAGNOSIS — Z952 Presence of prosthetic heart valve: Secondary | ICD-10-CM | POA: Insufficient documentation

## 2016-01-27 ENCOUNTER — Other Ambulatory Visit: Payer: Self-pay | Admitting: *Deleted

## 2016-01-28 ENCOUNTER — Encounter (HOSPITAL_COMMUNITY)
Admission: RE | Admit: 2016-01-28 | Discharge: 2016-01-28 | Disposition: A | Discharge status: Home / Self Care | Payer: BLUE CROSS/BLUE SHIELD | Source: Ambulatory Visit | Attending: Cardiology | Admitting: Cardiology

## 2016-01-28 ENCOUNTER — Other Ambulatory Visit: Payer: Self-pay | Admitting: *Deleted

## 2016-01-28 DIAGNOSIS — Z952 Presence of prosthetic heart valve: Secondary | ICD-10-CM | POA: Diagnosis not present

## 2016-01-28 MED ORDER — CARVEDILOL 6.25 MG PO TABS: 6.2500 mg | ORAL_TABLET | Freq: Two times a day (BID) | ORAL | Status: DC

## 2016-01-31 ENCOUNTER — Encounter (HOSPITAL_COMMUNITY)
Admission: RE | Admit: 2016-01-31 | Discharge: 2016-01-31 | Disposition: A | Discharge status: Home / Self Care | Payer: BLUE CROSS/BLUE SHIELD | Source: Ambulatory Visit | Attending: Cardiology | Admitting: Cardiology

## 2016-01-31 DIAGNOSIS — Z952 Presence of prosthetic heart valve: Secondary | ICD-10-CM | POA: Diagnosis not present

## 2016-02-02 ENCOUNTER — Encounter (HOSPITAL_COMMUNITY): Payer: Self-pay

## 2016-02-02 ENCOUNTER — Encounter (HOSPITAL_COMMUNITY)
Admission: RE | Admit: 2016-02-02 | Discharge: 2016-02-02 | Disposition: A | Discharge status: Home / Self Care | Payer: BLUE CROSS/BLUE SHIELD | Source: Ambulatory Visit | Attending: Cardiology | Admitting: Cardiology

## 2016-02-02 DIAGNOSIS — Z952 Presence of prosthetic heart valve: Secondary | ICD-10-CM | POA: Diagnosis not present

## 2016-02-02 LAB — GLUCOSE, CAPILLARY: Glucose-Capillary: 81 mg/dL (ref 65–99)

## 2016-02-02 NOTE — Progress Notes (Signed)
Pt will graduate from  cardiac rehab program on 02/04/16 with completion of 36 exercise sessions in Phase II. Pt maintained good attendance and progressed nicely during her participation in rehab as evidenced by increased MET level.  Pt is able to walk independently without assistive device.   Medication list reconciled. Repeat  PHQ score- 0 .  Pt has made significant lifestyle changes and should be commended for her success. Pt feels she has achieved her goals during cardiac rehab.   Pt plans to continue exercising on her own at home.

## 2016-02-02 NOTE — Progress Notes (Signed)
Pt briefly c/o dizziness post exercise walking track for cool down at cardiac rehab today.  BP: 122/84.  CBG-81.  Symptoms resolved with rest.  Pt denies any other symptoms.  No change in regimen.  Pt given graham crackers.  Pt has regularly scheduled dialysis appt today. Will continue to monitor.

## 2016-02-04 ENCOUNTER — Encounter (HOSPITAL_COMMUNITY)
Admission: RE | Admit: 2016-02-04 | Discharge: 2016-02-04 | Disposition: A | Discharge status: Home / Self Care | Payer: BLUE CROSS/BLUE SHIELD | Source: Ambulatory Visit | Attending: Cardiology | Admitting: Cardiology

## 2016-02-04 ENCOUNTER — Encounter: Payer: Self-pay | Admitting: Family

## 2016-02-04 DIAGNOSIS — Z952 Presence of prosthetic heart valve: Secondary | ICD-10-CM | POA: Diagnosis not present

## 2016-02-07 ENCOUNTER — Encounter (HOSPITAL_COMMUNITY): Admission: RE | Admit: 2016-02-07 | Payer: BLUE CROSS/BLUE SHIELD | Source: Ambulatory Visit

## 2016-02-07 ENCOUNTER — Other Ambulatory Visit: Payer: Self-pay | Admitting: *Deleted

## 2016-02-07 ENCOUNTER — Ambulatory Visit: Payer: Self-pay | Admitting: Family

## 2016-02-07 MED ORDER — SERTRALINE HCL 25 MG PO TABS: 25.0000 mg | ORAL_TABLET | Freq: Every day | ORAL | Status: DC

## 2016-02-07 MED ORDER — BUSPIRONE HCL 10 MG PO TABS
10.0000 mg | ORAL_TABLET | Freq: Two times a day (BID) | ORAL | Status: DC
Start: 2016-02-07 — End: 2016-04-28

## 2016-02-07 MED ORDER — ROSUVASTATIN CALCIUM 20 MG PO TABS: 20.0000 mg | ORAL_TABLET | Freq: Every day | ORAL | Status: DC

## 2016-02-09 ENCOUNTER — Encounter (HOSPITAL_COMMUNITY): Payer: BLUE CROSS/BLUE SHIELD

## 2016-02-11 ENCOUNTER — Encounter (HOSPITAL_COMMUNITY): Payer: BLUE CROSS/BLUE SHIELD

## 2016-02-14 ENCOUNTER — Encounter (HOSPITAL_COMMUNITY): Payer: BLUE CROSS/BLUE SHIELD

## 2016-02-14 ENCOUNTER — Telehealth: Payer: Self-pay | Admitting: Surgery

## 2016-02-14 NOTE — Telephone Encounter (Signed)
Calling to make an appointment for pt to be seen tomorrow, 02/15/2016, per nurse she has left message on Triage line. The pt's arm is not looking to good.

## 2016-02-14 NOTE — Telephone Encounter (Signed)
Appt. Requested by Ernest Haber, PA with [facility] To schedule appt. Tomorrow, due to swelling in left hand / arm.  Noted in Dr. Stephens Shire office note of 2/27, that if pt's swelling persists, will schedule for a Fistulogram.  Will discuss with Dr. Trula Slade, and notify pt., and [place] with recommendations.

## 2016-02-15 NOTE — Telephone Encounter (Signed)
Pt. Returned phone call.  Reported that she feels her left arm swelling has improved from 2 weeks ago.  Stated she would like to have Dr. Trula Slade look at it.  Will discuss with Dr. Trula Slade and contact pt. With plan.

## 2016-02-15 NOTE — Telephone Encounter (Signed)
Discussed pt. with Dr. Trula Slade.  Informed that pt. denied pain in left arm access, and reported that swelling of left arm has improved, since examined by Dr. Joelyn Oms about 10 days ago.  Per Dr. Trula Slade, there is no need to schedule Fistulogram or office visit at this time, unless pt. has worsening symptoms.  Pt. notified of Dr. Stephens Shire recommendation.  Notified Tonya at the [facility] of the same.  Agreed with plan.

## 2016-02-15 NOTE — Telephone Encounter (Signed)
Discussed with Dr. Trula Slade.  Recommended to schedule pt. For Fisutlogram of left arm; poss intervention.  Attempted to call pt. This AM; left voice msg. To call back re: scheduling Fistulogram.

## 2016-02-16 ENCOUNTER — Encounter (HOSPITAL_COMMUNITY): Payer: BLUE CROSS/BLUE SHIELD

## 2016-02-16 ENCOUNTER — Telehealth: Payer: Self-pay | Admitting: Surgery

## 2016-02-16 ENCOUNTER — Encounter: Payer: Self-pay | Admitting: Family

## 2016-02-16 NOTE — Telephone Encounter (Signed)
See previous triage notes of 3/20 and 3/21;  per the pt., her swelling had improved and she denied any pain, thus an appt. was not rescheduled as was discussed and approved by Dr. Trula Slade.

## 2016-02-16 NOTE — Telephone Encounter (Signed)
Per Terence Lux, charge nurse, Dr Pearson Grippe, rounded and notice Antoniou has no appointment . She states paperwork was faxed on 8th of this month. Please call Terence Lux at [phone]. Thanks

## 2016-02-16 NOTE — Telephone Encounter (Signed)
Spoke with Carmell Austria. This patient was scheduled on 02/07/16 but did not show for the appointment. I have rescheduled her for 02/22/16 @ 1:15. Carmell Austria will make pt aware, and we will call to remind as well. dpm

## 2016-02-18 ENCOUNTER — Encounter (HOSPITAL_COMMUNITY): Payer: BLUE CROSS/BLUE SHIELD

## 2016-02-21 ENCOUNTER — Encounter (HOSPITAL_COMMUNITY): Payer: BLUE CROSS/BLUE SHIELD

## 2016-02-22 ENCOUNTER — Ambulatory Visit (INDEPENDENT_AMBULATORY_CARE_PROVIDER_SITE_OTHER): Payer: Self-pay | Admitting: Family

## 2016-02-22 ENCOUNTER — Encounter: Payer: Self-pay | Admitting: Family

## 2016-02-22 VITALS — BP 139/90 | HR 91 | Temp 97.7°F | Resp 18 | Ht 59.0 in | Wt 175.7 lb

## 2016-02-22 DIAGNOSIS — N186 End stage renal disease: Secondary | ICD-10-CM

## 2016-02-22 DIAGNOSIS — Z992 Dependence on renal dialysis: Secondary | ICD-10-CM

## 2016-02-22 DIAGNOSIS — M7989 Other specified soft tissue disorders: Secondary | ICD-10-CM

## 2016-02-22 DIAGNOSIS — I770 Arteriovenous fistula, acquired: Secondary | ICD-10-CM

## 2016-02-22 NOTE — Progress Notes (Signed)
Postoperative Access Visit   History of Present Illness  Jody Nguyen is a 60 y.o. year old female patient of Dr. Trula Slade who is s/p left radiocephalic fistula creation on 10/22/2015. On 12/28/2015 she was taken back for elevation of her fistula with branch ligation 2.   Dr. Trula Slade last saw pt on 01/24/16. At that time he discussed with the patient that her fistula would be ready for use in approximately 1 week.Dr. Trula Slade advised to not cannulate the fistula around the incision closest to the antecubital crease until the incision has completely healed. The more distal aspect of the forearm can be cannulated in a week. The patient was to contact Dr. Trula Slade if her swelling persisted. Dr. Trula Slade would like to perform a fistulogram if she continues to have swelling. She'll also contact Dr. Trula Slade if the incision has not healed within 4 weeks. Otherwise she will follow up on an as-needed basis.  The pt returns today as referred by Dr. Joelyn Oms with reported ongoing swelling around left arm incision site.  Pt states the swelling is only in her left forearm, not in her upper arm. Pt denies any steal symptoms, denies pain, numbness, or cold sensation in her left upper extremity. She denies fever or chills.  She is dialyzing on M-W-F via her right chest tunneled HD catheter. Pt states the swelling in her left forearm is why her AVF has not been accessed yet.  The patient's incisions are healed.  The patient is able to complete their activities of daily living.   For VQI Use Only  PRE-ADM LIVING: Home  AMB STATUS: Ambulatory  Physical Examination Filed Vitals:   02/22/16 1327 02/22/16 1331  BP: 140/93 139/90  Pulse: 91   Temp: 97.7 F (36.5 C)   TempSrc: Oral   Resp: 18   Height: 4\' 11"  (1.499 m)   Weight: 175 lb 11.2 oz (79.697 kg)   SpO2: 100%    Body mass index is 35.47 kg/(m^2).  Left forearm: Incision is well healed, skin feels normal and warm, hand grip is 5/5,  sensation in digits is intact, palpable thrill, bruit can be auscultated. Mild/moderate non pitting swelling of left forearm.   Medical Decision Making  Jody Nguyen is a 60 y.o. year old female who is s/p left radiocephalic fistula creation on 10/22/2015. On 12/28/2015 she was taken back for elevation of her fistula with branch ligation 2.  The pt returns today as referred by Dr. Joelyn Oms with reported ongoing swelling around left arm incision site.  Pt states the swelling is only in her left forearm, not in her upper arm. Pt denies any steal symptoms, denies pain, numbness, or cold sensation in her left upper extremity. She denies fever or chills.  She is dialyzing on M-W-F via her right chest tunneled HD catheter. Pt states the swelling in her left forearm is why her AVF has not been accessed yet.  Dr. Donnetta Hutching spoke with pt and friend and examined pt. Dr. Donnetta Hutching discussed with pt the options of waiting another month to see if the left arm swelling would resolve or scheduling a fistula-gram at this time. Pt agreed that she would wait a month, continue to use the right side chest tunneled HD catheter for HD,  and follow up with Dr. Trula Slade in a month. I advised pt to notify us if she develops pain, tingling, or numbness in her left hand, or if the left arm swelling worsens.   Thank you for allowing  [place] to participate in this patient's care.  Ferrin Liebig, Sharmon Leyden, RN, MSN, FNP-C [facility] [place] Office: [phone]  02/22/2016, 1:29 PM  Clinic MD: Kellie Simmering

## 2016-02-22 NOTE — Progress Notes (Signed)
.   Filed Vitals:   02/22/16 1327 02/22/16 1331  BP: 140/93 139/90  Pulse: 91   Temp: 97.7 F (36.5 C)   TempSrc: Oral   Resp: 18   Height: 4\' 11"  (1.499 m)   Weight: 175 lb 11.2 oz (79.697 kg)   SpO2: 100%

## 2016-02-23 ENCOUNTER — Encounter (HOSPITAL_COMMUNITY): Payer: BLUE CROSS/BLUE SHIELD

## 2016-02-25 ENCOUNTER — Encounter (HOSPITAL_COMMUNITY): Payer: BLUE CROSS/BLUE SHIELD

## 2016-03-02 ENCOUNTER — Encounter: Payer: Self-pay | Admitting: Internal Medicine

## 2016-03-02 ENCOUNTER — Ambulatory Visit (INDEPENDENT_AMBULATORY_CARE_PROVIDER_SITE_OTHER): Payer: BLUE CROSS/BLUE SHIELD | Admitting: Internal Medicine

## 2016-03-02 VITALS — BP 142/82 | HR 76 | Temp 97.7°F | Resp 16 | Ht 60.0 in | Wt 176.3 lb

## 2016-03-02 DIAGNOSIS — Z79899 Other long term (current) drug therapy: Secondary | ICD-10-CM

## 2016-03-02 DIAGNOSIS — I10 Essential (primary) hypertension: Secondary | ICD-10-CM

## 2016-03-02 DIAGNOSIS — E119 Type 2 diabetes mellitus without complications: Secondary | ICD-10-CM | POA: Diagnosis not present

## 2016-03-02 DIAGNOSIS — E559 Vitamin D deficiency, unspecified: Secondary | ICD-10-CM | POA: Diagnosis not present

## 2016-03-02 DIAGNOSIS — Z794 Long term (current) use of insulin: Secondary | ICD-10-CM | POA: Diagnosis not present

## 2016-03-02 DIAGNOSIS — E785 Hyperlipidemia, unspecified: Secondary | ICD-10-CM | POA: Diagnosis not present

## 2016-03-02 LAB — CBC WITH DIFFERENTIAL/PLATELET
Basophils Absolute: 40 cells/uL (ref 0–200)
Basophils Relative: 1 %
Eosinophils Absolute: 200 cells/uL (ref 15–500)
Eosinophils Relative: 5 %
HCT: 39.2 % (ref 35.0–45.0)
Hemoglobin: 12.2 g/dL (ref 11.7–15.5)
Lymphocytes Relative: 43 %
Lymphs Abs: 1720 cells/uL (ref 850–3900)
MCH: 27.7 pg (ref 27.0–33.0)
MCHC: 31.1 g/dL — ABNORMAL LOW (ref 32.0–36.0)
MCV: 88.9 fL (ref 80.0–100.0)
Monocytes Absolute: 400 cells/uL (ref 200–950)
Monocytes Relative: 10 %
Neutro Abs: 1640 cells/uL (ref 1500–7800)
Neutrophils Relative %: 41 %
Platelets: 186 10*3/uL (ref 140–400)
RBC: 4.41 MIL/uL (ref 3.80–5.10)
RDW: 19.2 % — ABNORMAL HIGH (ref 11.0–15.0)
WBC: 4 10*3/uL (ref 3.8–10.8)

## 2016-03-02 NOTE — Progress Notes (Signed)
[ OV 10/07/2015 :  This very nice 60 y.o. [name] presents for  follow up with Hypertension, Hyperlipidemia, Ins Dep T2_DM w/CKD and Vitamin D Deficiency. Patient recently underwent minimally invasive Mitral valve repair for severe Mitral Insufficiency and developed po Respiratory Failure requiring transfer to [place] for Trach and ECMO support and a 2 mo hospitalization with initiation of dialysis which continues to date.]  Subjective:    Patient ID: Jody Nguyen, female    DOB: 1956-09-24, 60 y.o.   MRN: [id number]  HPI Patient presents today c/o post Dialysis and standing she has had several episodes of fainting or near fainting & [place] Harps is unable to waly to the car w/o her husband's assistance. Apparently after eating Bkfst she doesn't eat and on MWF has her dialysis sessions from 11 AM - 3 PM and =usually doesn't eat or drink during the session and not until she gets home to eat supper those days. CBG's have been normal and in fact she has tapered off of Insulin since beginning Dialysis. Her CBG's usu range betw 90-100 mg%. Last SSI was 2 weeks ago.   Medication Sig  . aspirin EC 81 MG tablet Take 81 mg by mouth daily.  . busPIRone (BUSPAR) 10 MG tablet Take 1 tablet (10 mg total) by mouth 2 (two) times daily.  . carvedilol (COREG) 6.25 MG tablet Take 1 tablet (6.25 mg total) by mouth 2 (two) times daily.  . insulin aspart protamine - aspart (NOVOLOG MIX 70/30 FLEXPEN) (70-30) 100 UNIT/ML FlexPen Inject 0.1 mLs (10 Units total) into the skin 2 (two) times daily. (Patient taking differently: Inject 4 Units into the skin 2 (two) times daily as needed (If sugar goes above 180). )  . RENVELA 800 MG tablet Take 2,400 mg by mouth 3 (three) times daily. 3 caps with meals - 1 cap with snacks  . rosuvastatin (CRESTOR) 20 MG tablet Take 1 tablet (20 mg total) by mouth daily.  . sertraline (ZOLOFT) 25 MG tablet Take 1 tablet (25 mg total) by mouth daily.   Allergies  Allergen Reactions  . Minoxidil Other  (See Comments)    Pericardial effusion  . Lipitor [Atorvastatin] Other (See Comments)    REACTION: "pain in legs" Tolerates rosuvastatin  . Ace Inhibitors Other (See Comments)    REACTION: "not sure...think it made me drowsy all the time"   Past Medical History  Diagnosis Date  . HLD (hyperlipidemia)     takes Crestor daily  . Vitamin D deficiency   . CVA (cerebral infarction) 1997    no residual deficit  . Anemia   . CKD (chronic kidney disease) stage 4, GFR 15-29 ml/min (HCC)   . Diverticulitis   . History of cardiovascular stress test     a. Myoview Oct 2012 showed EF 49%, no ischemia, LVE  . Pericardial effusion     chronic; felt to be poss related to minoxidil >> DC'd  . S/P cardiac catheterization     a. R/L HC 06/18/15:  mLAD 30%; severe pulmo HTN with PA sat 43%, CI 1.86, prominent V waves indicative of MR; resting hypoxemia O2 sat 86% on RA  . Pulmonary hypertension ([place]) 06/18/2015  . Severe mitral regurgitation   . Chronic diastolic heart failure ([place])   . Stroke ([place]) 1997    no residual effect  . Heart murmur   . Shortness of breath dyspnea   . Constipation   . Hypertension     takes Hydralazine,Losartan,and Labetalol daily  . DM (  diabetes mellitus) ([place])     takes Novolog and Metformin daily  . Chronic diastolic CHF (congestive heart failure) (HCC)     takes Furosemide daily  . Respiratory failure, acute hypoxic, post-operative 07/08/2015    Requiring ECMO support  . S/P minimally invasive mitral valve repair 07/08/2015    Complex valvuloplasty including artificial Gore-tex neochord placement x10 and 26 mm Sorin Memo 3D Rechord ring annuloplasty via right mini thoracotomy approach  . Wears glasses    Past Surgical History  Procedure Laterality Date  . Cesarean section      x 2  . Tee without cardioversion N/A 06/08/2015    Procedure: TRANSESOPHAGEAL ECHOCARDIOGRAM (TEE);  Surgeon: Lelon Perla, MD;  Location: [facility] ENDOSCOPY;  Service: Cardiovascular;   Laterality: N/A;  . Cardiac catheterization    . Cardiac catheterization N/A 06/18/2015    Procedure: Right/Left Heart Cath and Coronary Angiography;  Surgeon: Jettie Booze, MD;  Location: [place] CV LAB;  Service: Cardiovascular;  Laterality: N/A;  . Colonoscopy    . Multiple extractions with alveoloplasty N/A 06/30/2015    Procedure: MULTIPLE EXTRACTIONS OF TOOTH #'S 4  AND 30 WITH ALVEOLOPLASTY AND GROSS DEBRIDEMENT  OF REMAINING TEETH;  Surgeon: Lenn Cal, DDS;  Location: [place];  Service: Oral Surgery;  Laterality: N/A;  . Mitral valve repair Right 07/08/2015    Procedure: MINIMALLY INVASIVE MITRAL VALVE REPAIR with a 26 Sorin Memo 3D Rechord;  Surgeon: Rexene Alberts, MD;  Location: [place];  Service: Open Heart Surgery;  Laterality: Right;  . Tee without cardioversion N/A 07/08/2015    Procedure: TRANSESOPHAGEAL ECHOCARDIOGRAM (TEE);  Surgeon: Rexene Alberts, MD;  Location: [place];  Service: Open Heart Surgery;  Laterality: N/A;  . Cannulation for cardiopulmonary bypass N/A 07/08/2015    Procedure: CANNULATION FOR ECMO;  Surgeon: Rexene Alberts, MD;  Location: [place];  Service: Open Heart Surgery;  Laterality: N/A;  . Av fistula placement Left 10/22/2015    Procedure: RADIOCEPHALIC ARTERIOVENOUS (AV) FISTULA CREATION;  Surgeon: Serafina Mitchell, MD;  Location: [place];  Service: Vascular;  Laterality: Left;  . Fistula superficialization Left 12/28/2015    Procedure: SUPERFICIALIZATION LEFT RADIOCEPHALIC FISTULA;  Surgeon: Serafina Mitchell, MD;  Location: [facility] OR;  Service: Vascular;  Laterality: Left;   Review of Systems  10 point systems review negative except as above.    Objective:   Physical Exam  BP 142/82 mmHg  Pulse 76  Temp(Src) 97.7 F (36.5 C)  Resp 16  Ht 5' (1.524 m)  Wt 176 lb 4.8 oz (79.969 kg)  BMI 34.43 kg/m2  Recheck sitting BP 136/91, P 95    and standing BP 139/91, P 97   HEENT - Eac's patent. TM's Nl. EOM's full. PERRLA. NasoOroPharynx clear. Neck -  supple. Nl Thyroid. Carotids 2+ & No bruits, nodes, JVD Chest - Clear equal BS w/o Rales, rhonchi, wheezes. Cor - Nl HS. RRR w/o sig MGR. PP 1(+). No edema. LUE AVF w/ thrill & bruit Abd - No  tenderness.  MS - FROM w/o deformities.  Neuro -  Nl w/o focal abnormalities.    Assessment & Plan:   1. Essential hypertension / Postural Hypotension  - TSH  2. Hyperlipidemia  - Lipid panel - TSH  3. Insulin Dependent T2_DM  (HCC)  - Hemoglobin A1c - Insulin, random  4. Vitamin D deficiency  - VITAMIN D 25 Hydroxy   5. Medication management  - CBC with Differential/Platelet - BASIC METABOLIC PANEL  WITH GFR - Hepatic function panel - Magnesium  - Recommended to patient to eat a "Brunch" & drink a bottle of water before Dialysis and a drink abottle of water while on Dialysis to avoid excess volume contraction

## 2016-03-02 NOTE — Patient Instructions (Signed)
Encourage on Dialysis days before 11 AM session   to eat a Brunch and drink a bottle of water   before the dialysis   and   at least 1 bottle during the Dialysis session

## 2016-03-03 LAB — HEPATIC FUNCTION PANEL
ALT: 16 U/L (ref 6–29)
AST: 15 U/L (ref 10–35)
Albumin: 4 g/dL (ref 3.6–5.1)
Alkaline Phosphatase: 65 U/L (ref 33–130)
Bilirubin, Direct: 0.1 mg/dL (ref <=0.2)
Indirect Bilirubin: 0.3 mg/dL (ref 0.2–1.2)
Total Bilirubin: 0.4 mg/dL (ref 0.2–1.2)
Total Protein: 8.1 g/dL (ref 6.1–8.1)

## 2016-03-03 LAB — BASIC METABOLIC PANEL WITH GFR
BUN: 40 mg/dL — ABNORMAL HIGH (ref 7–25)
CO2: 26 mmol/L (ref 20–31)
Calcium: 9.6 mg/dL (ref 8.6–10.4)
Chloride: 94 mmol/L — ABNORMAL LOW (ref 98–110)
Creat: 6.35 mg/dL — ABNORMAL HIGH (ref 0.50–0.99)
GFR, Est African American: 8 mL/min — ABNORMAL LOW (ref >=60)
GFR, Est Non African American: 7 mL/min — ABNORMAL LOW (ref >=60)
Glucose, Bld: 156 mg/dL — ABNORMAL HIGH (ref 65–99)
Potassium: 3.8 mmol/L (ref 3.5–5.3)
Sodium: 138 mmol/L (ref 135–146)

## 2016-03-03 LAB — LIPID PANEL
Cholesterol: 160 mg/dL (ref 125–200)
HDL: 51 mg/dL (ref >=46)
LDL Cholesterol: 79 mg/dL (ref <130)
Total CHOL/HDL Ratio: 3.1 Ratio (ref <=5.0)
Triglycerides: 149 mg/dL (ref <150)
VLDL: 30 mg/dL (ref <30)

## 2016-03-03 LAB — HEMOGLOBIN A1C
Hgb A1c MFr Bld: 5.6 % (ref <5.7)
Mean Plasma Glucose: 114 mg/dL

## 2016-03-03 LAB — INSULIN, RANDOM: Insulin: 71.1 u[IU]/mL — ABNORMAL HIGH (ref 2.0–19.6)

## 2016-03-03 LAB — TSH: TSH: 2.11 mIU/L

## 2016-03-03 LAB — MAGNESIUM: Magnesium: 2.2 mg/dL (ref 1.5–2.5)

## 2016-03-03 LAB — VITAMIN D 25 HYDROXY (VIT D DEFICIENCY, FRACTURES): Vit D, 25-Hydroxy: 38 ng/mL (ref 30–100)

## 2016-03-08 ENCOUNTER — Encounter: Payer: Self-pay | Admitting: Surgery

## 2016-03-20 ENCOUNTER — Ambulatory Visit (INDEPENDENT_AMBULATORY_CARE_PROVIDER_SITE_OTHER): Payer: Self-pay | Admitting: Surgery

## 2016-03-20 ENCOUNTER — Encounter: Payer: Self-pay | Admitting: Surgery

## 2016-03-20 ENCOUNTER — Other Ambulatory Visit: Payer: Self-pay

## 2016-03-20 VITALS — BP 161/104 | HR 92 | Ht 60.0 in | Wt 186.1 lb

## 2016-03-20 DIAGNOSIS — N186 End stage renal disease: Secondary | ICD-10-CM

## 2016-03-20 DIAGNOSIS — Z992 Dependence on renal dialysis: Secondary | ICD-10-CM

## 2016-03-20 NOTE — Progress Notes (Signed)
   Patient name: Jody Nguyen MRN: [id number] DOB: 1955-12-31 Sex: female  REASON FOR VISIT: Follow-up  HPI: Jody Nguyen is a 60 y.o. female who is status post elevation of a left radiocephalic fistula on [id number].  She has been having swelling in her left arm.  They have not used her fistula.  The swelling does not cause her any pain or discomfort.  Current Outpatient Prescriptions  Medication Sig Dispense Refill  . aspirin EC 81 MG tablet Take 81 mg by mouth daily.    . busPIRone (BUSPAR) 10 MG tablet Take 1 tablet (10 mg total) by mouth 2 (two) times daily. 60 tablet 1  . carvedilol (COREG) 6.25 MG tablet Take 1 tablet (6.25 mg total) by mouth 2 (two) times daily. 180 tablet 0  . insulin aspart protamine - aspart (NOVOLOG MIX 70/30 FLEXPEN) (70-30) 100 UNIT/ML FlexPen Inject 0.1 mLs (10 Units total) into the skin 2 (two) times daily. (Patient taking differently: Inject 4 Units into the skin 2 (two) times daily as needed (If sugar goes above 180). ) 15 mL 11  . RENVELA 800 MG tablet Take 2,400 mg by mouth 3 (three) times daily. 3 caps with meals - 1 cap with snacks  5  . rosuvastatin (CRESTOR) 20 MG tablet Take 1 tablet (20 mg total) by mouth daily. 30 tablet 1  . sertraline (ZOLOFT) 25 MG tablet Take 1 tablet (25 mg total) by mouth daily. 30 tablet 1   No current facility-administered medications for this visit.    REVIEW OF SYSTEMS:  [X]  denotes positive finding, [ ]  denotes negative finding Cardiac  Comments:  Chest pain or chest pressure:    Shortness of breath upon exertion:    Short of breath when lying flat:    Irregular heart rhythm:    Constitutional    Fever or chills:      PHYSICAL EXAM: Filed Vitals:   03/20/16 0834 03/20/16 0835  BP: 162/101 161/104  Pulse: 92   Height: 5' (1.524 m)   Weight: 186 lb 1.6 oz (84.414 kg)   SpO2: 99%     GENERAL: The patient is a well-nourished female, in no acute distress. The vital signs are documented  above. CARDIOVASCULAR: There is a regular rate and rhythm. PULMONARY: There is good air exchange bilaterally without wheezing or rales. Excellent thrill within left radiocephalic fistula.  All incisions are healed.  Stable left arm edema  MEDICAL ISSUES: The patient has had persistent swelling in her left arm.  She does have a history of ECMO cannulation of the left side and scarring in the infraclavicular region.  I suspect she has some form of central venous stenosis.  We discussed evaluating this with a left arm fistulogram and intervention if indicated.  This been scheduled for Tuesday, May 2  [name] [facility] [place]: [phone]

## 2016-03-28 ENCOUNTER — Encounter (HOSPITAL_COMMUNITY)
Admission: RE | Disposition: A | Discharge status: Home / Self Care | Payer: Self-pay | Source: Ambulatory Visit | Attending: Surgery

## 2016-03-28 ENCOUNTER — Encounter (HOSPITAL_COMMUNITY): Payer: Self-pay | Admitting: Surgery

## 2016-03-28 ENCOUNTER — Ambulatory Visit (HOSPITAL_COMMUNITY)
Admission: RE | Admit: 2016-03-28 | Discharge: 2016-03-28 | Disposition: A | Discharge status: Home / Self Care | Payer: BLUE CROSS/BLUE SHIELD | Source: Ambulatory Visit | Attending: Surgery | Admitting: Surgery

## 2016-03-28 DIAGNOSIS — Z4901 Encounter for fitting and adjustment of extracorporeal dialysis catheter: Secondary | ICD-10-CM | POA: Diagnosis not present

## 2016-03-28 DIAGNOSIS — M7989 Other specified soft tissue disorders: Secondary | ICD-10-CM | POA: Diagnosis present

## 2016-03-28 DIAGNOSIS — T82898A Other specified complication of vascular prosthetic devices, implants and grafts, initial encounter: Secondary | ICD-10-CM | POA: Diagnosis not present

## 2016-03-28 DIAGNOSIS — I82B12 Acute embolism and thrombosis of left subclavian vein: Secondary | ICD-10-CM | POA: Diagnosis not present

## 2016-03-28 DIAGNOSIS — Z7982 Long term (current) use of aspirin: Secondary | ICD-10-CM | POA: Insufficient documentation

## 2016-03-28 HISTORY — PX: PERIPHERAL VASCULAR CATHETERIZATION: SHX172C

## 2016-03-28 LAB — GLUCOSE, CAPILLARY: Glucose-Capillary: 84 mg/dL (ref 65–99)

## 2016-03-28 LAB — POCT I-STAT, CHEM 8
BUN: 36 mg/dL — ABNORMAL HIGH (ref 6–20)
Calcium, Ion: 1.19 mmol/L (ref 1.13–1.30)
Chloride: 96 mmol/L — ABNORMAL LOW (ref 101–111)
Creatinine, Ser: 5.5 mg/dL — ABNORMAL HIGH (ref 0.44–1.00)
Glucose, Bld: 108 mg/dL — ABNORMAL HIGH (ref 65–99)
HCT: 38 % (ref 36.0–46.0)
Hemoglobin: 12.9 g/dL (ref 12.0–15.0)
Potassium: 4.3 mmol/L (ref 3.5–5.1)
Sodium: 137 mmol/L (ref 135–145)
TCO2: 30 mmol/L (ref 0–100)

## 2016-03-28 SURGERY — A/V SHUNTOGRAM/FISTULAGRAM
Laterality: Left

## 2016-03-28 MED ORDER — LABETALOL HCL 5 MG/ML IV SOLN: 10.0000 mg | INTRAVENOUS | Status: DC | PRN

## 2016-03-28 MED ORDER — FENTANYL CITRATE (PF) 100 MCG/2ML IJ SOLN
INTRAMUSCULAR | Status: AC
  Filled 2016-03-28: qty 2

## 2016-03-28 MED ORDER — HEPARIN (PORCINE) IN NACL 2-0.9 UNIT/ML-% IJ SOLN
INTRAMUSCULAR | Status: DC | PRN
  Administered 2016-03-28: 500 mL

## 2016-03-28 MED ORDER — METOPROLOL TARTRATE 5 MG/5ML IV SOLN: 2.0000 mg | INTRAVENOUS | Status: DC | PRN

## 2016-03-28 MED ORDER — HEPARIN (PORCINE) IN NACL 2-0.9 UNIT/ML-% IJ SOLN
INTRAMUSCULAR | Status: AC
  Filled 2016-03-28: qty 500

## 2016-03-28 MED ORDER — MIDAZOLAM HCL 2 MG/2ML IJ SOLN
INTRAMUSCULAR | Status: AC
  Filled 2016-03-28: qty 2

## 2016-03-28 MED ORDER — MIDAZOLAM HCL 2 MG/2ML IJ SOLN
INTRAMUSCULAR | Status: DC | PRN
  Administered 2016-03-28: 1 mg via INTRAVENOUS

## 2016-03-28 MED ORDER — FENTANYL CITRATE (PF) 100 MCG/2ML IJ SOLN
INTRAMUSCULAR | Status: DC | PRN
  Administered 2016-03-28: 25 ug via INTRAVENOUS

## 2016-03-28 MED ORDER — ALUM & MAG HYDROXIDE-SIMETH 200-200-20 MG/5ML PO SUSP: 15.0000 mL | ORAL | Status: DC | PRN

## 2016-03-28 MED ORDER — ONDANSETRON HCL 4 MG/2ML IJ SOLN: 4.0000 mg | Freq: Four times a day (QID) | INTRAMUSCULAR | Status: DC | PRN

## 2016-03-28 MED ORDER — OXYCODONE HCL 5 MG PO TABS: 5.0000 mg | ORAL_TABLET | ORAL | Status: DC | PRN

## 2016-03-28 MED ORDER — DOCUSATE SODIUM 100 MG PO CAPS: 100.0000 mg | ORAL_CAPSULE | Freq: Every day | ORAL | Status: DC

## 2016-03-28 MED ORDER — LIDOCAINE HCL (PF) 1 % IJ SOLN
INTRAMUSCULAR | Status: DC | PRN
  Administered 2016-03-28: 2 mL

## 2016-03-28 MED ORDER — GUAIFENESIN-DM 100-10 MG/5ML PO SYRP
15.0000 mL | ORAL_SOLUTION | ORAL | Status: DC | PRN
  Filled 2016-03-28: qty 15

## 2016-03-28 MED ORDER — SODIUM CHLORIDE 0.9% FLUSH: 3.0000 mL | INTRAVENOUS | Status: DC | PRN

## 2016-03-28 MED ORDER — PHENOL 1.4 % MT LIQD
1.0000 | OROMUCOSAL | Status: DC | PRN
  Filled 2016-03-28: qty 177

## 2016-03-28 MED ORDER — HYDRALAZINE HCL 20 MG/ML IJ SOLN: 5.0000 mg | INTRAMUSCULAR | Status: DC | PRN

## 2016-03-28 SURGICAL SUPPLY — 8 items
BAG SNAP BAND KOVER 36X36 (MISCELLANEOUS) ×2 IMPLANT
COVER DOME SNAP 22 D (MISCELLANEOUS) ×2 IMPLANT
COVER PRB 48X5XTLSCP FOLD TPE (BAG) ×2 IMPLANT
COVER PROBE 5X48 (BAG) ×2
KIT MICROINTRODUCER STIFF 5F (SHEATH) ×2 IMPLANT
STOPCOCK MORSE 400PSI 3WAY (MISCELLANEOUS) ×4 IMPLANT
TRAY PV CATH (CUSTOM PROCEDURE TRAY) ×2 IMPLANT
TUBING CIL FLEX 10 FLL-RA (TUBING) ×4 IMPLANT

## 2016-03-28 NOTE — Op Note (Addendum)
    Patient name: Jody Nguyen MRN: [id number] DOB: 11-Dec-1955 Sex: female  03/28/2016 Pre-operative Diagnosis: left arm swelling Post-operative diagnosis:  Same Surgeon:  [name] Procedure Performed:  1.  Ultrasound-guided access, left cephalic vein  2.  fistulogram  3.  Conscious sedation from [phone]     Indications:  He patient has previously undergone left radiocephalic fistula creation as well as elevation of her fistula.  She has developed swelling in her arm and she is here for further evaluation.  Of note she does have a history of a catheter in her left chest  Procedure:  The patient was identified in the holding area and taken to room 8.  The patient was then placed supine on the table and prepped and draped in the usual sterile fashion.  A time out was called.  Conscious sedation from [phone] was performed with the use of 1 mg of Versed and 25 g of fentanyl.  Continuous monitoring of heart rate blood pressure and oxygen saturations were performed by myself and the circulating nurse.  We will present for the entire portion of the procedure.Ultrasound was used to evaluate the fistula.  The vein was patent and compressible.  A digital ultrasound image was acquired.  The fistula was then accessed under ultrasound guidance using a micropuncture needle.  An 018 wire was then asvanced without resistance and a micropuncture sheath was placed.  Contrast injections were then performed through the sheath.  Findings:  The cephalic vein remains widely patent in the upper arm and forearm.  Large collaterals were visualized in the upper chest.  The subclavian vein appears to be occluded for a short segment.   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  Because of the tortuosity of the vessel do not think she is a candidate for an attempt at recanalization  #2  Recommended using the fistula.  The patient appears to be able to tolerate the  swelling in her arm.  If she gets to the point where she cannot, she would need ligation of her fistula    V. [name], M.D. [facility] Office: [phone] Pager:  [phone]

## 2016-03-28 NOTE — Discharge Instructions (Signed)
Fistulogram, Care After °Refer to this sheet in the next few weeks. These instructions provide you with information on caring for yourself after your procedure. Your health care provider may also give you more specific instructions. Your treatment has been planned according to current medical practices, but problems sometimes occur. Call your health care provider if you have any problems or questions after your procedure. °WHAT TO EXPECT AFTER THE PROCEDURE °After your procedure, it is typical to have the following: °· A small amount of discomfort in the area where the catheters were placed. °· A small amount of bruising around the fistula. °· Sleepiness and fatigue. °HOME CARE INSTRUCTIONS °· Rest at home for the day following your procedure. °· Do not drive or operate heavy machinery while taking pain medicine. °· Take medicines only as directed by your health care provider. °· Do not take baths, swim, or use a hot tub until your health care provider approves. You may shower 24 hours after the procedure or as directed by your health care provider. °· There are many different ways to close and cover an incision, including stitches, skin glue, and adhesive strips. Follow your health care provider's instructions on: °¨ Incision care. °¨ Bandage (dressing) changes and removal. °¨ Incision closure removal. °· Monitor your dialysis fistula carefully. °SEEK MEDICAL CARE IF: °· You have drainage, redness, swelling, or pain at your catheter site. °· You have a fever. °· You have chills. °SEEK IMMEDIATE MEDICAL CARE IF: °· You feel weak. °· You have trouble balancing. °· You have trouble moving your arms or legs. °· You have problems with your speech or vision. °· You can no longer feel a vibration or buzz when you put your fingers over your dialysis fistula. °· The limb that was used for the procedure: °¨ Swells. °¨ Is painful. °¨ Is cold. °¨ Is discolored, such as blue or pale white. °  °This information is not intended  to replace advice given to you by your health care provider. Make sure you discuss any questions you have with your health care provider. °  °Document Released: 03/30/2014 Document Reviewed: 03/30/2014 °Elsevier Interactive Patient Education ©2016 [employer] ° °

## 2016-03-28 NOTE — Interval H&P Note (Signed)
History and Physical Interval Note:  03/28/2016 9:31 AM  Jody Nguyen  has presented today for surgery, with the diagnosis of left arm swelling  The various methods of treatment have been discussed with the patient and family. After consideration of risks, benefits and other options for treatment, the patient has consented to  Procedure(s): A/V Fistulagram (N/A) as a surgical intervention .  The patient's history has been reviewed, patient examined, no change in status, stable for surgery.  I have reviewed the patient's chart and labs.  Questions were answered to the patient's satisfaction.     [name]

## 2016-03-28 NOTE — H&P (View-Only) (Signed)
   Patient name: Jody Nguyen MRN: [id number] DOB: Feb 07, 1956 Sex: female  REASON FOR VISIT: Follow-up  HPI: Jody Nguyen is a 60 y.o. female who is status post elevation of a left radiocephalic fistula on [id number].  She has been having swelling in her left arm.  They have not used her fistula.  The swelling does not cause her any pain or discomfort.  Current Outpatient Prescriptions  Medication Sig Dispense Refill  . aspirin EC 81 MG tablet Take 81 mg by mouth daily.    . busPIRone (BUSPAR) 10 MG tablet Take 1 tablet (10 mg total) by mouth 2 (two) times daily. 60 tablet 1  . carvedilol (COREG) 6.25 MG tablet Take 1 tablet (6.25 mg total) by mouth 2 (two) times daily. 180 tablet 0  . insulin aspart protamine - aspart (NOVOLOG MIX 70/30 FLEXPEN) (70-30) 100 UNIT/ML FlexPen Inject 0.1 mLs (10 Units total) into the skin 2 (two) times daily. (Patient taking differently: Inject 4 Units into the skin 2 (two) times daily as needed (If sugar goes above 180). ) 15 mL 11  . RENVELA 800 MG tablet Take 2,400 mg by mouth 3 (three) times daily. 3 caps with meals - 1 cap with snacks  5  . rosuvastatin (CRESTOR) 20 MG tablet Take 1 tablet (20 mg total) by mouth daily. 30 tablet 1  . sertraline (ZOLOFT) 25 MG tablet Take 1 tablet (25 mg total) by mouth daily. 30 tablet 1   No current facility-administered medications for this visit.    REVIEW OF SYSTEMS:  [X]  denotes positive finding, [ ]  denotes negative finding Cardiac  Comments:  Chest pain or chest pressure:    Shortness of breath upon exertion:    Short of breath when lying flat:    Irregular heart rhythm:    Constitutional    Fever or chills:      PHYSICAL EXAM: Filed Vitals:   03/20/16 0834 03/20/16 0835  BP: 162/101 161/104  Pulse: 92   Height: 5' (1.524 m)   Weight: 186 lb 1.6 oz (84.414 kg)   SpO2: 99%     GENERAL: The patient is a well-nourished female, in no acute distress. The vital signs are documented  above. CARDIOVASCULAR: There is a regular rate and rhythm. PULMONARY: There is good air exchange bilaterally without wheezing or rales. Excellent thrill within left radiocephalic fistula.  All incisions are healed.  Stable left arm edema  MEDICAL ISSUES: The patient has had persistent swelling in her left arm.  She does have a history of ECMO cannulation of the left side and scarring in the infraclavicular region.  I suspect she has some form of central venous stenosis.  We discussed evaluating this with a left arm fistulogram and intervention if indicated.  This been scheduled for Tuesday, May 2  [name] [facility]: [phone]

## 2016-03-28 NOTE — Progress Notes (Signed)
Site area: left FA fistula Site Prior to Removal:  Level 0 Pressure Applied For: 40 min Manual:   yes Patient Status During Pull: stable  Post Pull Site:  Level 0 Post Pull Instructions Given:  palpable Dressing Applied:  tegaderm/pressure Bedrest begins @ 1120 Comments: difficult hold

## 2016-04-10 ENCOUNTER — Encounter: Payer: Self-pay | Admitting: Internal Medicine

## 2016-04-12 ENCOUNTER — Other Ambulatory Visit: Payer: Self-pay | Admitting: *Deleted

## 2016-04-12 MED ORDER — SERTRALINE HCL 25 MG PO TABS: 25.0000 mg | ORAL_TABLET | Freq: Every day | ORAL | Status: DC

## 2016-04-12 MED ORDER — CARVEDILOL 6.25 MG PO TABS: 6.2500 mg | ORAL_TABLET | Freq: Two times a day (BID) | ORAL | Status: DC

## 2016-04-28 ENCOUNTER — Other Ambulatory Visit: Payer: Self-pay | Admitting: *Deleted

## 2016-04-28 MED ORDER — BUSPIRONE HCL 10 MG PO TABS
10.0000 mg | ORAL_TABLET | Freq: Two times a day (BID) | ORAL | Status: DC
Start: 2016-04-28 — End: 2016-05-05

## 2016-05-05 ENCOUNTER — Other Ambulatory Visit: Payer: Self-pay | Admitting: *Deleted

## 2016-05-05 MED ORDER — ROSUVASTATIN CALCIUM 20 MG PO TABS: 20.0000 mg | ORAL_TABLET | Freq: Every day | ORAL | Status: DC

## 2016-05-05 MED ORDER — CARVEDILOL 6.25 MG PO TABS: 6.2500 mg | ORAL_TABLET | Freq: Two times a day (BID) | ORAL | Status: DC

## 2016-05-05 MED ORDER — BUSPIRONE HCL 10 MG PO TABS: 10.0000 mg | ORAL_TABLET | Freq: Two times a day (BID) | ORAL | Status: DC

## 2016-05-05 MED ORDER — SERTRALINE HCL 25 MG PO TABS: 25.0000 mg | ORAL_TABLET | Freq: Every day | ORAL | Status: DC

## 2016-05-06 ENCOUNTER — Encounter: Payer: Self-pay | Admitting: *Deleted

## 2016-07-04 ENCOUNTER — Other Ambulatory Visit: Payer: Self-pay | Admitting: Internal Medicine

## 2016-07-04 DIAGNOSIS — Z1231 Encounter for screening mammogram for malignant neoplasm of breast: Secondary | ICD-10-CM

## 2016-07-10 ENCOUNTER — Emergency Department (HOSPITAL_COMMUNITY): Payer: BLUE CROSS/BLUE SHIELD

## 2016-07-10 ENCOUNTER — Emergency Department (HOSPITAL_COMMUNITY)
Admission: EM | Admit: 2016-07-10 | Discharge: 2016-07-10 | Disposition: A | Discharge status: Home / Self Care | Payer: BLUE CROSS/BLUE SHIELD | Attending: Emergency Medicine | Admitting: Emergency Medicine

## 2016-07-10 ENCOUNTER — Encounter: Payer: Self-pay | Admitting: Thoracic Surgery (Cardiothoracic Vascular Surgery)

## 2016-07-10 ENCOUNTER — Encounter (HOSPITAL_COMMUNITY): Payer: Self-pay | Admitting: Emergency Medicine

## 2016-07-10 DIAGNOSIS — K920 Hematemesis: Secondary | ICD-10-CM | POA: Diagnosis present

## 2016-07-10 DIAGNOSIS — I5032 Chronic diastolic (congestive) heart failure: Secondary | ICD-10-CM | POA: Diagnosis not present

## 2016-07-10 DIAGNOSIS — Z8673 Personal history of transient ischemic attack (TIA), and cerebral infarction without residual deficits: Secondary | ICD-10-CM | POA: Diagnosis not present

## 2016-07-10 DIAGNOSIS — D649 Anemia, unspecified: Secondary | ICD-10-CM | POA: Diagnosis not present

## 2016-07-10 DIAGNOSIS — Z992 Dependence on renal dialysis: Secondary | ICD-10-CM | POA: Insufficient documentation

## 2016-07-10 DIAGNOSIS — Z7901 Long term (current) use of anticoagulants: Secondary | ICD-10-CM | POA: Diagnosis not present

## 2016-07-10 DIAGNOSIS — Z7982 Long term (current) use of aspirin: Secondary | ICD-10-CM | POA: Insufficient documentation

## 2016-07-10 DIAGNOSIS — E1165 Type 2 diabetes mellitus with hyperglycemia: Secondary | ICD-10-CM | POA: Insufficient documentation

## 2016-07-10 DIAGNOSIS — I132 Hypertensive heart and chronic kidney disease with heart failure and with stage 5 chronic kidney disease, or end stage renal disease: Secondary | ICD-10-CM | POA: Insufficient documentation

## 2016-07-10 DIAGNOSIS — Z794 Long term (current) use of insulin: Secondary | ICD-10-CM | POA: Insufficient documentation

## 2016-07-10 DIAGNOSIS — N186 End stage renal disease: Secondary | ICD-10-CM | POA: Insufficient documentation

## 2016-07-10 DIAGNOSIS — E1122 Type 2 diabetes mellitus with diabetic chronic kidney disease: Secondary | ICD-10-CM | POA: Insufficient documentation

## 2016-07-10 LAB — POC OCCULT BLOOD, ED: Fecal Occult Bld: NEGATIVE

## 2016-07-10 LAB — CBC
HCT: 34 % — ABNORMAL LOW (ref 36.0–46.0)
Hemoglobin: 10.5 g/dL — ABNORMAL LOW (ref 12.0–15.0)
MCH: 27.7 pg (ref 26.0–34.0)
MCHC: 30.9 g/dL (ref 30.0–36.0)
MCV: 89.7 fL (ref 78.0–100.0)
Platelets: 147 10*3/uL — ABNORMAL LOW (ref 150–400)
RBC: 3.79 MIL/uL — ABNORMAL LOW (ref 3.87–5.11)
RDW: 17.6 % — ABNORMAL HIGH (ref 11.5–15.5)
WBC: 15.9 10*3/uL — ABNORMAL HIGH (ref 4.0–10.5)

## 2016-07-10 LAB — COMPREHENSIVE METABOLIC PANEL
ALT: 17 U/L (ref 14–54)
AST: 19 U/L (ref 15–41)
Albumin: 4.5 g/dL (ref 3.5–5.0)
Alkaline Phosphatase: 60 U/L (ref 38–126)
Anion gap: 15 (ref 5–15)
BUN: 32 mg/dL — ABNORMAL HIGH (ref 6–20)
CO2: 26 mmol/L (ref 22–32)
Calcium: 9 mg/dL (ref 8.9–10.3)
Chloride: 93 mmol/L — ABNORMAL LOW (ref 101–111)
Creatinine, Ser: 6.46 mg/dL — ABNORMAL HIGH (ref 0.44–1.00)
GFR calc Af Amer: 7 mL/min — ABNORMAL LOW (ref >60)
GFR calc non Af Amer: 6 mL/min — ABNORMAL LOW (ref >60)
Glucose, Bld: 159 mg/dL — ABNORMAL HIGH (ref 65–99)
Potassium: 3.7 mmol/L (ref 3.5–5.1)
Sodium: 134 mmol/L — ABNORMAL LOW (ref 135–145)
Total Bilirubin: 0.8 mg/dL (ref 0.3–1.2)
Total Protein: 9 g/dL — ABNORMAL HIGH (ref 6.5–8.1)

## 2016-07-10 NOTE — ED Provider Notes (Addendum)
[facility] DEPT Provider Note   CSN: [id number] Arrival date & time: 07/10/16  1709     History   Chief Complaint Chief Complaint  Patient presents with  . Shortness of Breath  . Hematemesis    HPI Jody Nguyen is a 60 y.o. female.  HPI Patient vomited one time today immediately after dialysis. Symptoms accompanied by lightheadedness and shortness of breath which lasted for 2 or 3 minutes. She reports that she vomits frequently after having received hemodialysis when her blood pressure gets low. She is presently asymptomatic. She denies having had any chest pain denies shortness of breath presently and is asymptomatic she denies lightheadedness with standing. Bowel movements normal. Denies abdominal pain. No treatment prior to coming here. Nothing made symptoms better or worse. Symptoms resolved spontaneously. She reports that emesis was blood tinged, watery with a pinkish hue Past Medical History:  Diagnosis Date  . Anemia   . Chronic diastolic CHF (congestive heart failure) (HCC)    takes Furosemide daily  . Chronic diastolic heart failure ([place])   . CKD (chronic kidney disease) stage 4, GFR 15-29 ml/min (HCC)   . Constipation   . CVA (cerebral infarction) 1997   no residual deficit  . Diverticulitis   . DM (diabetes mellitus) ([place])    takes Novolog and Metformin daily  . Heart murmur   . History of cardiovascular stress test    a. Myoview Oct 2012 showed EF 49%, no ischemia, LVE  . HLD (hyperlipidemia)    takes Crestor daily  . Hypertension    takes Hydralazine,Losartan,and Labetalol daily  . Pericardial effusion    chronic; felt to be poss related to minoxidil >> DC'd  . Pulmonary hypertension ([place]) 06/18/2015  . Respiratory failure, acute hypoxic, post-operative 07/08/2015   Requiring ECMO support  . S/P cardiac catheterization    a. R/L HC 06/18/15:  mLAD 30%; severe pulmo HTN with PA sat 43%, CI 1.86, prominent V waves indicative of MR; resting hypoxemia O2  sat 86% on RA  . S/P minimally invasive mitral valve repair 07/08/2015   Complex valvuloplasty including artificial Gore-tex neochord placement x10 and 26 mm Sorin Memo 3D Rechord ring annuloplasty via right mini thoracotomy approach  . Severe mitral regurgitation   . Shortness of breath dyspnea   . Stroke ([place]) 1997   no residual effect  . Vitamin D deficiency   . Wears glasses     Patient Active Problem List   Diagnosis Date Noted  . End stage renal disease ([place]) 10/19/2015  . BMI 31.0-31.9,adult 10/09/2015  . ESRD on dialysis ([place]) 10/09/2015  . Long term (current) use of anticoagulants 09/14/2015  . Cardiomyopathy ([place]) 09/10/2015  . Atrial fibrillation ([place]) 09/10/2015  . Respiratory failure, acute hypoxic, post-operative 07/08/2015  . S/P minimally invasive mitral valve repair 07/08/2015  . Chronic periodontitis 06/30/2015  . Chronic diastolic heart failure ([place])   . Pulmonary hypertension ([place]) 06/18/2015  . Severe mitral regurgitation 05/31/2015  . Insulin Dependent T2_DM  ([place]) 05/31/2015  . Pericardial effusion 04/30/2015  . Morbid obesity (BMI 36.05)  03/29/2015  . Acute on chronic diastolic CHF (congestive heart failure) ([place]) 11/22/2014  . Insulin dependent diabetes with renal manifestation ([place]) 11/22/2014  . Vitamin D deficiency 03/25/2014  . Medication management 03/25/2014  . Anemia in chronic kidney disease   . Hypertension 08/17/2011  . Hyperlipidemia 08/17/2011    Past Surgical History:  Procedure Laterality Date  . AV FISTULA PLACEMENT Left 10/22/2015   Procedure: RADIOCEPHALIC ARTERIOVENOUS (AV)  FISTULA CREATION;  Surgeon: Serafina Mitchell, MD;  Location: [facility];  Service: Vascular;  Laterality: Left;  . CANNULATION FOR CARDIOPULMONARY BYPASS N/A 07/08/2015   Procedure: CANNULATION FOR ECMO;  Surgeon: Rexene Alberts, MD;  Location: [facility];  Service: Open Heart Surgery;  Laterality: N/A;  . CARDIAC CATHETERIZATION    . CARDIAC CATHETERIZATION N/A 06/18/2015    Procedure: Right/Left Heart Cath and Coronary Angiography;  Surgeon: Jettie Booze, MD;  Location: [facility];  Service: Cardiovascular;  Laterality: N/A;  . CESAREAN SECTION     x 2  . COLONOSCOPY    . FISTULA SUPERFICIALIZATION Left 12/28/2015   Procedure: SUPERFICIALIZATION LEFT RADIOCEPHALIC FISTULA;  Surgeon: Serafina Mitchell, MD;  Location: [facility];  Service: Vascular;  Laterality: Left;  . MITRAL VALVE REPAIR Right 07/08/2015   Procedure: MINIMALLY INVASIVE MITRAL VALVE REPAIR with a 26 Sorin Memo 3D Rechord;  Surgeon: Rexene Alberts, MD;  Location: [facility];  Service: Open Heart Surgery;  Laterality: Right;  . MULTIPLE EXTRACTIONS WITH ALVEOLOPLASTY N/A 06/30/2015   Procedure: MULTIPLE EXTRACTIONS OF TOOTH #'S 4  AND 30 WITH ALVEOLOPLASTY AND GROSS DEBRIDEMENT  OF REMAINING TEETH;  Surgeon: Lenn Cal, DDS;  Location: [facility];  Service: Oral Surgery;  Laterality: N/A;  . PERIPHERAL VASCULAR CATHETERIZATION Left 03/28/2016   Procedure: A/V Fistulagram;  Surgeon: Serafina Mitchell, MD;  Location: [facility];  Service: Cardiovascular;  Laterality: Left;  lower arm  . TEE WITHOUT CARDIOVERSION N/A 06/08/2015   Procedure: TRANSESOPHAGEAL ECHOCARDIOGRAM (TEE);  Surgeon: Lelon Perla, MD;  Location: [facility] END[facility];  Service: Cardiovascular;  Laterality: N/A;  . TEE WITHOUT CARDIOVERSION N/A 07/08/2015   Procedure: TRANSESOPHAGEAL ECHOCARDIOGRAM (TEE);  Surgeon: Rexene Alberts, MD;  Location: [facility];  Service: Open Heart Surgery;  Laterality: N/A;    OB History    Gravida Para Term Preterm AB Living   3 3 3          SAB TAB Ectopic Multiple Live Births                   Home Medications    Prior to Admission medications   Medication Sig Start Date End Date Taking? Authorizing Provider  aspirin EC 81 MG tablet Take 81 mg by mouth daily.    Historical Provider, MD  busPIRone (BUSPAR) 10 MG tablet Take 1 tablet (10 mg total) by mouth 2 (two) times daily. 05/05/16 05/05/17   Unk Pinto, MD  carvedilol (COREG) 6.25 MG tablet Take 1 tablet (6.25 mg total) by mouth 2 (two) times daily. 05/05/16 05/05/17  Unk Pinto, MD  insulin aspart protamine - aspart (NOVOLOG MIX 70/30 FLEXPEN) (70-30) 100 UNIT/ML FlexPen Inject 0.1 mLs (10 Units total) into the skin 2 (two) times daily. Patient taking differently: Inject 4 Units into the skin 2 (two) times daily as needed (If sugar goes above 180).  06/19/15   Scott T Weaver, PA-C  RENVELA 800 MG tablet Take 800-2,400 mg by mouth 3 (three) times daily. 2400 mg (3 caps) with meals and 800 mg (1 cap) with snacks 12/21/15   Historical Provider, MD  rosuvastatin (CRESTOR) 20 MG tablet Take 1 tablet (20 mg total) by mouth daily. 05/05/16   Unk Pinto, MD  sertraline (ZOLOFT) 25 MG tablet Take 1 tablet (25 mg total) by mouth daily. 05/05/16   Unk Pinto, MD    Family History Family History  Problem Relation Age of Onset  . Cancer Sister  breast cancer  . Stroke Mother   . Heart attack Brother     MI in his 52s  . Heart disease Brother     before age 61  . Breast cancer Sister     Social History Social History  Substance Use Topics  . Smoking status: Never Smoker  . Smokeless tobacco: Never Used  . Alcohol use No     Allergies   Minoxidil; Lipitor [atorvastatin]; and Ace inhibitors   Review of Systems Review of Systems  Constitutional: Negative.   HENT: Negative.   Respiratory: Positive for shortness of breath.   Cardiovascular: Negative.   Gastrointestinal: Positive for vomiting. Negative for blood in stool.  Musculoskeletal: Negative.   Skin: Negative.   Allergic/Immunologic: Positive for immunocompromised state.       Dialysis patient  Neurological: Positive for light-headedness.  Psychiatric/Behavioral: Negative.   All other systems reviewed and are negative.    Physical Exam Updated Vital Signs BP 108/82   Pulse 91   Temp 98 F (36.7 C) (Oral)   Resp 16   SpO2 98%   Physical Exam    Constitutional: She appears well-developed and well-nourished.  HENT:  Head: Normocephalic and atraumatic.  Eyes: Conjunctivae are normal. Pupils are equal, round, and reactive to light.  Neck: Neck supple. No tracheal deviation present. No thyromegaly present.  Cardiovascular: Normal rate and regular rhythm.   No murmur heard. Pulmonary/Chest: Effort normal and breath sounds normal.  Abdominal: Soft. Bowel sounds are normal. She exhibits no distension. There is no tenderness.  Obese  Genitourinary: Rectal exam shows guaiac negative stool.  Genitourinary Comments: Rectal normal tone brown stool Hemoccult negative  Musculoskeletal: Normal range of motion. She exhibits no edema or tenderness.  Left upper extremity with dialysis fistula with good thrill  Neurological: She is alert. Coordination normal.  Gait normal. Not lightheaded on standing  Skin: Skin is warm and dry. No rash noted.  Psychiatric: She has a normal mood and affect.  Nursing note and vitals reviewed.    ED Treatments / Results  Labs (all labs ordered are listed, but only abnormal results are displayed) Labs Reviewed  CBC - Abnormal; Notable for the following:       Result Value   WBC 15.9 (*)    RBC 3.79 (*)    Hemoglobin 10.5 (*)    HCT 34.0 (*)    RDW 17.6 (*)    Platelets 147 (*)    All other components within normal limits  COMPREHENSIVE METABOLIC PANEL  POC OCCULT BLOOD, ED    EKG  EKG Interpretation  Date/Time:  Monday July 10 2016 17:18:27 EDT Ventricular Rate:  103 PR Interval:  158 QRS Duration: 94 QT Interval:  422 QTC Calculation: 552 R Axis:   -50 Text Interpretation:  Long QTc Sinus tachycardia Left axis deviation Low voltage QRS Cannot rule out Anterior infarct , age undetermined Prolonged QT Abnormal ECG No significant change since last tracing Confirmed by Winfred Leeds  MD, Shivansh Hardaway [phone]) on 07/10/2016 6:13:32 PM       Radiology No results found.  Procedures Procedures (including  critical care time)  Medications Ordered in ED Medications - No data to display  Chest x-ray viewed by me  8:40 PM patient is asymptomatic feels well she ate while here. Results for orders placed or performed during the hospital encounter of 07/10/16  Comprehensive metabolic panel  Result Value Ref Range   Sodium 134 (L) 135 - 145 mmol/L   Potassium 3.7 3.5 -  5.1 mmol/L   Chloride 93 (L) 101 - 111 mmol/L   CO2 26 22 - 32 mmol/L   Glucose, Bld 159 (H) 65 - 99 mg/dL   BUN 32 (H) 6 - 20 mg/dL   Creatinine, Ser 6.46 (H) 0.44 - 1.00 mg/dL   Calcium 9.0 8.9 - 10.3 mg/dL   Total Protein 9.0 (H) 6.5 - 8.1 g/dL   Albumin 4.5 3.5 - 5.0 g/dL   AST 19 15 - 41 U/L   ALT 17 14 - 54 U/L   Alkaline Phosphatase 60 38 - 126 U/L   Total Bilirubin 0.8 0.3 - 1.2 mg/dL   GFR calc non Af Amer 6 (L) >60 mL/min   GFR calc Af Amer 7 (L) >60 mL/min   Anion gap 15 5 - 15  CBC  Result Value Ref Range   WBC 15.9 (H) 4.0 - 10.5 K/uL   RBC 3.79 (L) 3.87 - 5.11 MIL/uL   Hemoglobin 10.5 (L) 12.0 - 15.0 g/dL   HCT 34.0 (L) 36.0 - 46.0 %   MCV 89.7 78.0 - 100.0 fL   MCH 27.7 26.0 - 34.0 pg   MCHC 30.9 30.0 - 36.0 g/dL   RDW 17.6 (H) 11.5 - 15.5 %   Platelets 147 (L) 150 - 400 K/uL  POC occult blood, ED  Result Value Ref Range   Fecal Occult Bld NEGATIVE NEGATIVE   Dg Chest 2 View  Result Date: 07/10/2016 CLINICAL DATA:  Shortness breath. Vomiting. Hypotension. Chronic kidney disease. Recently completed dialysis. EXAM: CHEST  2 VIEW COMPARISON:  10/05/2015 FINDINGS: The heart size and mediastinal contours are within normal limits. Chronic elevation of right hemidiaphragm again noted. Surgical clips again seen in right paratracheal region. Both lungs are clear. No evidence of pneumothorax or pleural effusion. The visualized skeletal structures are unremarkable. IMPRESSION: No active cardiopulmonary disease. Electronically Signed   By: Earle Gell M.D.   On: 07/10/2016 20:15   Initial Impression / Assessment  and Plan / ED Course  I have reviewed the triage vital signs and the nursing notes.  Pertinent labs & imaging results that were available during my care of the patient were reviewed by me and considered in my medical decision making (see chart for details).  Clinical Course   Case discussed with Dr. Lorrene Reid. Patient's hemoglobin was 11.3 on 07/05/2016. No appreciable drop in hemoglobin. Patient asymptomatic presently and feels ready to go home.  She is advised to return if she vomits more blood and keep scheduled point for hemodialysis in 2 days Final Clinical Impressions(s) / ED Diagnoses  Diagnosis #1 hematemesis #2transient hypotension #3 anemia #4 hyperglycemia Final diagnoses:  None    New Prescriptions New Prescriptions   No medications on file     Orlie Dakin, MD 07/10/16 2044    Orlie Dakin, MD 07/10/16 2045

## 2016-07-10 NOTE — ED Notes (Signed)
Wheeled pt back to room from waiting room. 

## 2016-07-10 NOTE — ED Triage Notes (Signed)
Just finished dialysis. Started vomiting during dialysis and had low blood pressure at the facility. Did not want EMS transport, so came by private vehicle. Note from dialysis says 85/27 after dialysis and 103/42 on departure. Note from dialysis says SOB and coughed up blood tinged sputum. Patient says the blood was not coughed up, it was vomited up.

## 2016-07-10 NOTE — Discharge Instructions (Signed)
Keep your scheduled palmar for hemodialysis in 2 days. Return if you vomit more blood or if concern for any reason

## 2016-07-11 ENCOUNTER — Encounter: Payer: Self-pay | Admitting: Thoracic Surgery (Cardiothoracic Vascular Surgery)

## 2016-07-13 ENCOUNTER — Ambulatory Visit
Admission: RE | Admit: 2016-07-13 | Discharge: 2016-07-13 | Disposition: A | Discharge status: Home / Self Care | Payer: BLUE CROSS/BLUE SHIELD | Source: Ambulatory Visit | Attending: Internal Medicine | Admitting: Internal Medicine

## 2016-07-13 ENCOUNTER — Encounter: Payer: Self-pay | Admitting: Thoracic Surgery (Cardiothoracic Vascular Surgery)

## 2016-07-13 ENCOUNTER — Ambulatory Visit (INDEPENDENT_AMBULATORY_CARE_PROVIDER_SITE_OTHER): Payer: BLUE CROSS/BLUE SHIELD | Admitting: Thoracic Surgery (Cardiothoracic Vascular Surgery)

## 2016-07-13 VITALS — BP 122/80 | HR 100 | Resp 20 | Ht 59.0 in | Wt 185.0 lb

## 2016-07-13 DIAGNOSIS — Z9889 Other specified postprocedural states: Secondary | ICD-10-CM | POA: Diagnosis not present

## 2016-07-13 DIAGNOSIS — I340 Nonrheumatic mitral (valve) insufficiency: Secondary | ICD-10-CM | POA: Diagnosis not present

## 2016-07-13 DIAGNOSIS — I5032 Chronic diastolic (congestive) heart failure: Secondary | ICD-10-CM

## 2016-07-13 DIAGNOSIS — Z1231 Encounter for screening mammogram for malignant neoplasm of breast: Secondary | ICD-10-CM

## 2016-07-13 NOTE — Patient Instructions (Signed)

## 2016-07-13 NOTE — Progress Notes (Signed)
[address]       [place] [address]             [phone]     CARDIOTHORACIC SURGERY OFFICE NOTE  Referring Provider is CRENSHAW, Denice Bors, MD PCP is Alesia Richards, MD   HPI:  Patient is [name] with multiple medical problems who returns to our office today for routine follow-up approximately 6 months status post minimally invasive mitral valve repair.  Her surgical procedure was complicated by the development of profound acute hypoxemic respiratory failure secondary to acute unilateral lung injury and pulmonary edema that failed attempts at management using conventional ventilatory support and ultimately required extracorporeal membrane oxygenator support.  She was transferred to [facility] where she gradually recovered over a two-month period of time. She had history of stage III chronic kidney disease prior to surgery and she subsequently developed acute renal failure requiring initiation of hemodialysis. Echocardiogram performed during her hospitalization at [facility] by report revealed intact mitral valve repair with trivial residual mitral regurgitation and mild left ventricular systolic dysfunction, ejection fraction estimated 50%.  She was eventually discharged home and she was seen most recently in our office on 01/10/2016 at which time she was doing fairly well. Since then she has been seen at [facility] for possible kidney transplantation, but she has not heard back as to whether or not she has felt to be a candidate. She continues to dialyze 3 days a week and has had problems with severe hypotension following nearly every dialysis session until this past week when her dialysis protocol was adjusted following a brief hospitalization. She has not had any cardiac problems.  She states that she is fairly active when she is not going to dialysis. She gets short of breath with activity but  only to a mild degree and her activities are only slightly limited by exertional shortness of breath. For the most part she can do everything that she wants to do. She notes that her breathing is "much improved" in comparison with how she felt prior to mitral valve repair last year. Her diabetes has been under very good control and overall she is pleased with her current health status other than problems related to dialysis.   Current Outpatient Prescriptions  Medication Sig Dispense Refill  . aspirin EC 81 MG tablet Take 81 mg by mouth daily.    . busPIRone (BUSPAR) 10 MG tablet Take 1 tablet (10 mg total) by mouth 2 (two) times daily. 180 tablet 1  . carvedilol (COREG) 6.25 MG tablet Take 1 tablet (6.25 mg total) by mouth 2 (two) times daily. 180 tablet 1  . insulin aspart protamine - aspart (NOVOLOG MIX 70/30 FLEXPEN) (70-30) 100 UNIT/ML FlexPen Inject 0.1 mLs (10 Units total) into the skin 2 (two) times daily. (Patient taking differently: Inject 4 Units into the skin 2 (two) times daily as needed (If sugar goes above 180). ) 15 mL 11  . RENVELA 800 MG tablet Take 800-2,400 mg by mouth 3 (three) times daily. 2400 mg (3 caps) with meals and 800 mg (1 cap) with snacks  5  . rosuvastatin (CRESTOR) 20 MG tablet Take 1 tablet (20 mg total) by mouth daily. 90 tablet 1  . sertraline (ZOLOFT) 25 MG tablet Take 1 tablet (25 mg total) by mouth daily. 90 tablet 1   No current facility-administered medications for this visit.       Physical Exam:  BP 122/80 (BP Location: Right Arm, Patient Position: Sitting, Cuff Size: Large)   Pulse 100   Resp 20   Ht 4\' 11"  (1.499 m)   Wt 185 lb (83.9 kg)   SpO2 95% Comment: RA  BMI 37.37 kg/m   General:  Obese but well appearing  Chest:   Clear to auscultation  CV:   Regular rate and rhythm without murmur  Incisions:  Completely healed  Abdomen:  Soft and nontender  Extremities:  Warm and well-perfused, no lower extremity edema  Diagnostic  Tests:  n/a   Impression:  Patient is doing well approximately one year status post minimally invasive mitral valve repair although she remains on hemodialysis. She describes stable symptoms of exertional shortness of breath consistent with chronic diastolic congestive heart failure, New York Heart Association functional class II. Overall she feels better than she did prior to mitral valve repair. She has not had a follow-up echocardiogram performed since last September.  Plan:  I would recommend checking a routine follow-up echocardiogram at some point in the near future. We have not recommended any changes to the patient's current medications.  The patient has been reminded regarding the importance of dental hygiene and the lifelong need for antibiotic prophylaxis for all dental cleanings and other related invasive procedures.  In the future the patient will call and return to see [place] only should specific problems or questions arise.  I spent in excess of 15 minutes during the conduct of this office consultation and >50% of this time involved direct face-to-face encounter with the patient for counseling and/or coordination of their care.    [name], MD 07/13/2016 3:47 PM

## 2016-07-18 ENCOUNTER — Encounter: Payer: Self-pay | Admitting: Thoracic Surgery (Cardiothoracic Vascular Surgery)

## 2016-08-01 NOTE — Progress Notes (Signed)
HPI: Follow-up hypertension, diastolic heart failure and previous mitral valve repair. Renal dopplers Nov 2013 showed no RAS. Note previous pericardial effusion with minoxidil. Transesophageal echocardiogram July 2016 showed normal LV function, moderate to severe left atrial enlargement, small pericardial effusion, flail segment of anterior mitral valve leaflet with severe mitral regurgitation and mild tricuspid regurgitation. Cardiac catheterization September 2016 showed nonobstructive coronary disease and severely elevated pulmonary pressures. Carotid Dopplers August 2016 showed 1-39% bilateral stenosis. Patient had mitral valve repair in August 2016. Patient's course was complicated by acute hypoxic respiratory failure refractory to conventional ventilatory support. She was transferred to [facility] for ECMO. Course was complicated by need for tracheostomy. She also developed acute on chronic renal failure and now requires dialysis. Follow-up echocardiogram at [facility] showed ejection fraction 40%. Note I do not have all records available. Echocardiogram ordered at last office visit but not performed. Since last seen, the patient denies any dyspnea on exertion, orthopnea, PND, pedal edema, palpitations, syncope or chest pain.   Current Outpatient Prescriptions  Medication Sig Dispense Refill  . aspirin EC 81 MG tablet Take 81 mg by mouth daily.    . busPIRone (BUSPAR) 10 MG tablet Take 1 tablet (10 mg total) by mouth 2 (two) times daily. 180 tablet 1  . carvedilol (COREG) 6.25 MG tablet Take 1 tablet (6.25 mg total) by mouth 2 (two) times daily. 180 tablet 1  . insulin aspart protamine - aspart (NOVOLOG MIX 70/30 FLEXPEN) (70-30) 100 UNIT/ML FlexPen Inject 0.1 mLs (10 Units total) into the skin 2 (two) times daily. (Patient taking differently: Inject 4 Units into the skin 2 (two) times daily as needed (If sugar goes above 180). ) 15 mL 11  . RENVELA 800 MG tablet Take 800-2,400 mg by mouth 3  (three) times daily. 2400 mg (3 caps) with meals and 800 mg (1 cap) with snacks  5  . rosuvastatin (CRESTOR) 20 MG tablet Take 1 tablet (20 mg total) by mouth daily. 90 tablet 1  . sertraline (ZOLOFT) 25 MG tablet Take 1 tablet (25 mg total) by mouth daily. 90 tablet 1   No current facility-administered medications for this visit.      Past Medical History:  Diagnosis Date  . Anemia   . Chronic diastolic CHF (congestive heart failure) (HCC)    takes Furosemide daily  . Chronic diastolic heart failure ([place])   . CKD (chronic kidney disease) stage 4, GFR 15-29 ml/min (HCC)   . Constipation   . CVA (cerebral infarction) 1997   no residual deficit  . Diverticulitis   . DM (diabetes mellitus) ([place])    takes Novolog and Metformin daily  . Heart murmur   . History of cardiovascular stress test    a. Myoview Oct 2012 showed EF 49%, no ischemia, LVE  . HLD (hyperlipidemia)    takes Crestor daily  . Hypertension    takes Hydralazine,Losartan,and Labetalol daily  . Pericardial effusion    chronic; felt to be poss related to minoxidil >> DC'd  . Pulmonary hypertension ([place]) 06/18/2015  . Respiratory failure, acute hypoxic, post-operative 07/08/2015   Requiring ECMO support  . S/P cardiac catheterization    a. R/L HC 06/18/15:  mLAD 30%; severe pulmo HTN with PA sat 43%, CI 1.86, prominent V waves indicative of MR; resting hypoxemia O2 sat 86% on RA  . S/P minimally invasive mitral valve repair 07/08/2015   Complex valvuloplasty including artificial Gore-tex neochord placement x10 and 26 mm Sorin Memo 3D  Rechord ring annuloplasty via right mini thoracotomy approach  . Severe mitral regurgitation   . Shortness of breath dyspnea   . Stroke ([place]) 1997   no residual effect  . Vitamin D deficiency   . Wears glasses     Past Surgical History:  Procedure Laterality Date  . AV FISTULA PLACEMENT Left 10/22/2015   Procedure: RADIOCEPHALIC ARTERIOVENOUS (AV) FISTULA CREATION;  Surgeon: Serafina Mitchell, MD;  Location: [place];  Service: Vascular;  Laterality: Left;  . CANNULATION FOR CARDIOPULMONARY BYPASS N/A 07/08/2015   Procedure: CANNULATION FOR ECMO;  Surgeon: Rexene Alberts, MD;  Location: [place];  Service: Open Heart Surgery;  Laterality: N/A;  . CARDIAC CATHETERIZATION    . CARDIAC CATHETERIZATION N/A 06/18/2015   Procedure: Right/Left Heart Cath and Coronary Angiography;  Surgeon: Jettie Booze, MD;  Location: [place] CV LAB;  Service: Cardiovascular;  Laterality: N/A;  . CESAREAN SECTION     x 2  . COLONOSCOPY    . FISTULA SUPERFICIALIZATION Left 12/28/2015   Procedure: SUPERFICIALIZATION LEFT RADIOCEPHALIC FISTULA;  Surgeon: Serafina Mitchell, MD;  Location: [place];  Service: Vascular;  Laterality: Left;  . MITRAL VALVE REPAIR Right 07/08/2015   Procedure: MINIMALLY INVASIVE MITRAL VALVE REPAIR with a 26 Sorin Memo 3D Rechord;  Surgeon: Rexene Alberts, MD;  Location: [place];  Service: Open Heart Surgery;  Laterality: Right;  . MULTIPLE EXTRACTIONS WITH ALVEOLOPLASTY N/A 06/30/2015   Procedure: MULTIPLE EXTRACTIONS OF TOOTH #'S 4  AND 30 WITH ALVEOLOPLASTY AND GROSS DEBRIDEMENT  OF REMAINING TEETH;  Surgeon: Lenn Cal, DDS;  Location: [place];  Service: Oral Surgery;  Laterality: N/A;  . PERIPHERAL VASCULAR CATHETERIZATION Left 03/28/2016   Procedure: A/V Fistulagram;  Surgeon: Serafina Mitchell, MD;  Location: [place] CV LAB;  Service: Cardiovascular;  Laterality: Left;  lower arm  . TEE WITHOUT CARDIOVERSION N/A 06/08/2015   Procedure: TRANSESOPHAGEAL ECHOCARDIOGRAM (TEE);  Surgeon: Lelon Perla, MD;  Location: [facility] ENDOSCOPY;  Service: Cardiovascular;  Laterality: N/A;  . TEE WITHOUT CARDIOVERSION N/A 07/08/2015   Procedure: TRANSESOPHAGEAL ECHOCARDIOGRAM (TEE);  Surgeon: Rexene Alberts, MD;  Location: [place];  Service: Open Heart Surgery;  Laterality: N/A;    Social History   Social History  . Marital status: Married    Spouse name: N/A  . Number of children:  3  . Years of education: N/A   Occupational History  .      Unemployed; used to work as a Radio broadcast assistant   Social History Main Topics  . Smoking status: Never Smoker  . Smokeless tobacco: Never Used  . Alcohol use No  . Drug use: No  . Sexual activity: Yes    Birth control/ protection: None   Other Topics Concern  . Not on file   Social History Narrative  . No narrative on file    Family History  Problem Relation Age of Onset  . Cancer Sister     breast cancer  . Stroke Mother   . Heart attack Brother     MI in his 61s  . Heart disease Brother     before age 53  . Breast cancer Sister     ROS: no fevers or chills, productive cough, hemoptysis, dysphasia, odynophagia, melena, hematochezia, dysuria, hematuria, rash, seizure activity, orthopnea, PND, pedal edema, claudication. Remaining systems are negative.  Physical Exam: Well-developed well-nourished in no acute distress.  Skin is warm and dry.  HEENT is normal.  Neck is  supple.  Chest is clear to auscultation with normal expansion.  Cardiovascular exam is regular rate and rhythm.  Abdominal exam nontender or distended. No masses palpated. Extremities show no edema. neuro grossly intact  ECG  A/P  1 Status post mitral valve repair-continue SBE prophylaxis.Schedule echocardiogram.  2 Chronic diastolic congestive heart failure-volume is stable. Now managed by dialysis.  3 cardiomyopathy-Plan repeat echocardiogram. Continue low-dose beta blocker. She is having hypotension on dialysis days and we will therefore not add an ACE inhibitor or hydralazine/nitrates.  4 hyperlipidemia-continue statin.  5 hypertension-blood pressure runs low on dialysis days. She is holding her carvedilol the night before in the morning of. This may need to be discontinued in the future if symptoms worsen.  6 Endstage renal disease-management per nephrology.  [name], MD

## 2016-08-03 ENCOUNTER — Encounter: Payer: Self-pay | Admitting: Cardiology

## 2016-08-03 ENCOUNTER — Ambulatory Visit (INDEPENDENT_AMBULATORY_CARE_PROVIDER_SITE_OTHER): Payer: BLUE CROSS/BLUE SHIELD | Admitting: Cardiology

## 2016-08-03 VITALS — BP 136/82 | HR 98 | Ht 59.0 in | Wt 194.0 lb

## 2016-08-03 DIAGNOSIS — I10 Essential (primary) hypertension: Secondary | ICD-10-CM

## 2016-08-03 DIAGNOSIS — Z9889 Other specified postprocedural states: Secondary | ICD-10-CM | POA: Diagnosis not present

## 2016-08-03 DIAGNOSIS — E785 Hyperlipidemia, unspecified: Secondary | ICD-10-CM | POA: Diagnosis not present

## 2016-08-03 DIAGNOSIS — N186 End stage renal disease: Secondary | ICD-10-CM

## 2016-08-03 NOTE — Patient Instructions (Signed)
Medication Instructions:  NO CHANGES.   Testing/Procedures: Your physician has requested that you have an echocardiogram. Echocardiography is a painless test that uses sound waves to create images of your heart. It provides your doctor with information about the size and shape of your heart and how well your heart's chambers and valves are working. This procedure takes approximately one hour. There are no restrictions for this procedure.   Follow-Up: Your physician wants you to follow-up in: [place]. You will receive a reminder letter in the mail two months in advance. If you don't receive a letter, please call our office to schedule the follow-up appointment.   If you need a refill on your cardiac medications before your next appointment, please call your pharmacy.

## 2016-08-05 IMAGING — DX DG LUMBAR SPINE COMPLETE 4+V
5 series · 5 of 5 positions shown · non-contrast
Comparison: None.

CLINICAL DATA: Low back pain radiating to right hip region

EXAM:
LUMBAR SPINE - COMPLETE 4+ VIEW

[l-spine ap]
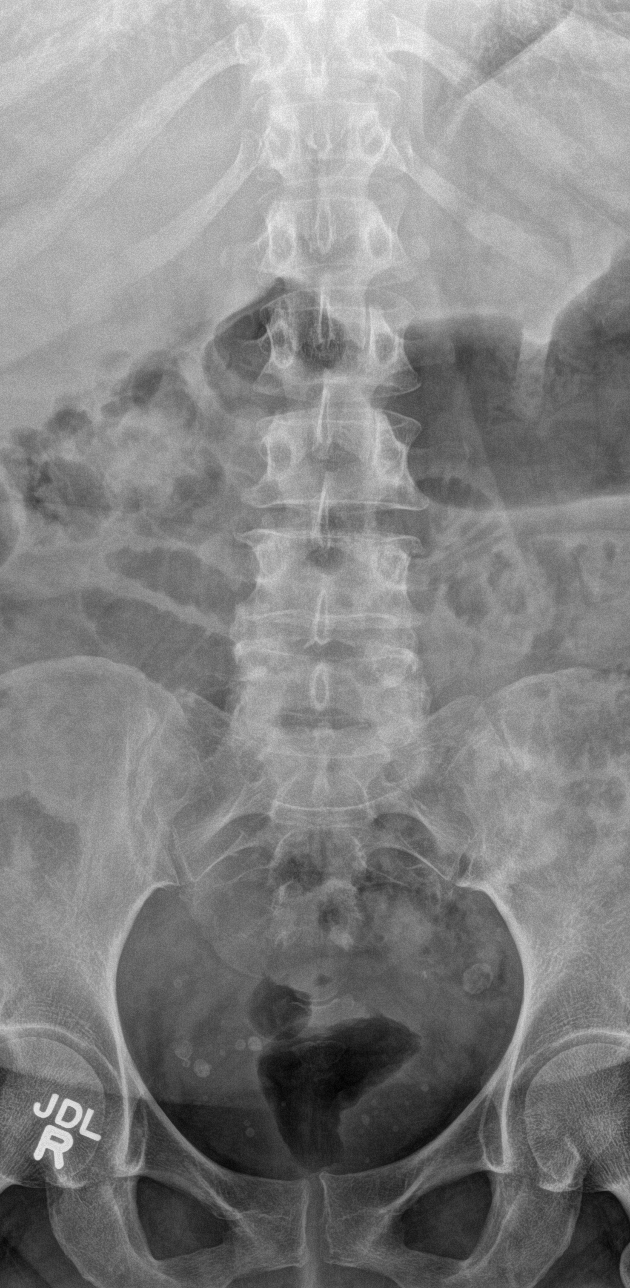

[l-spine obl (1 of 2)]
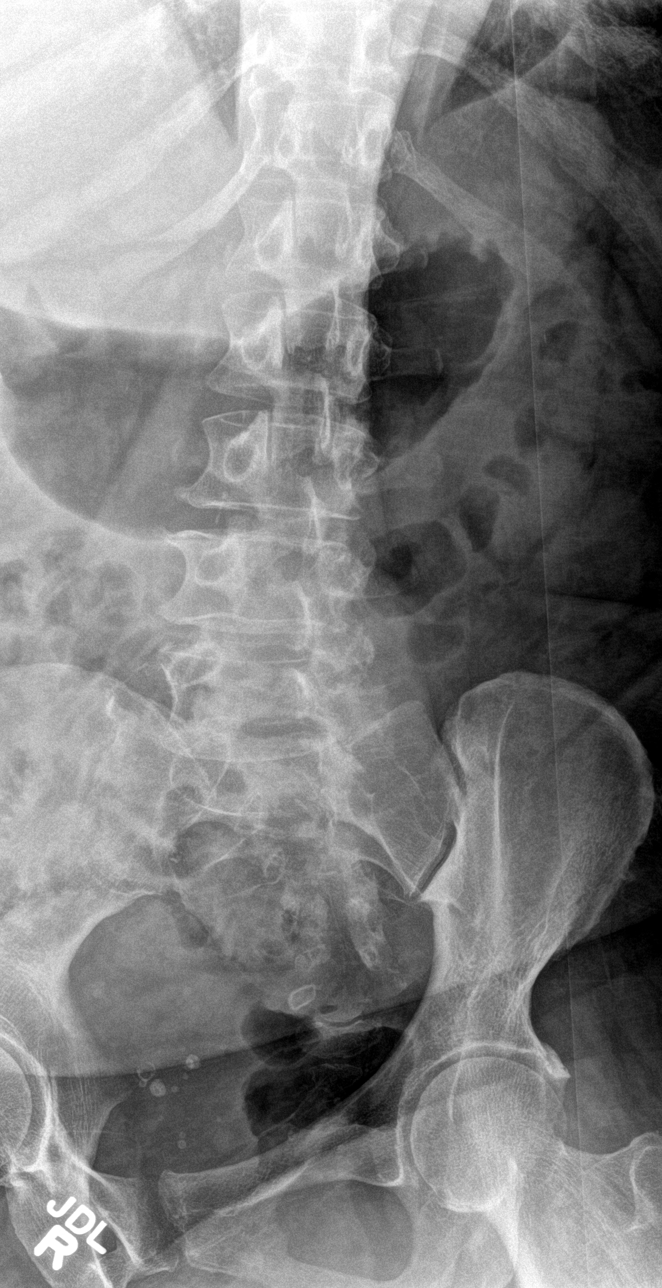

[l-spine obl (2 of 2)]
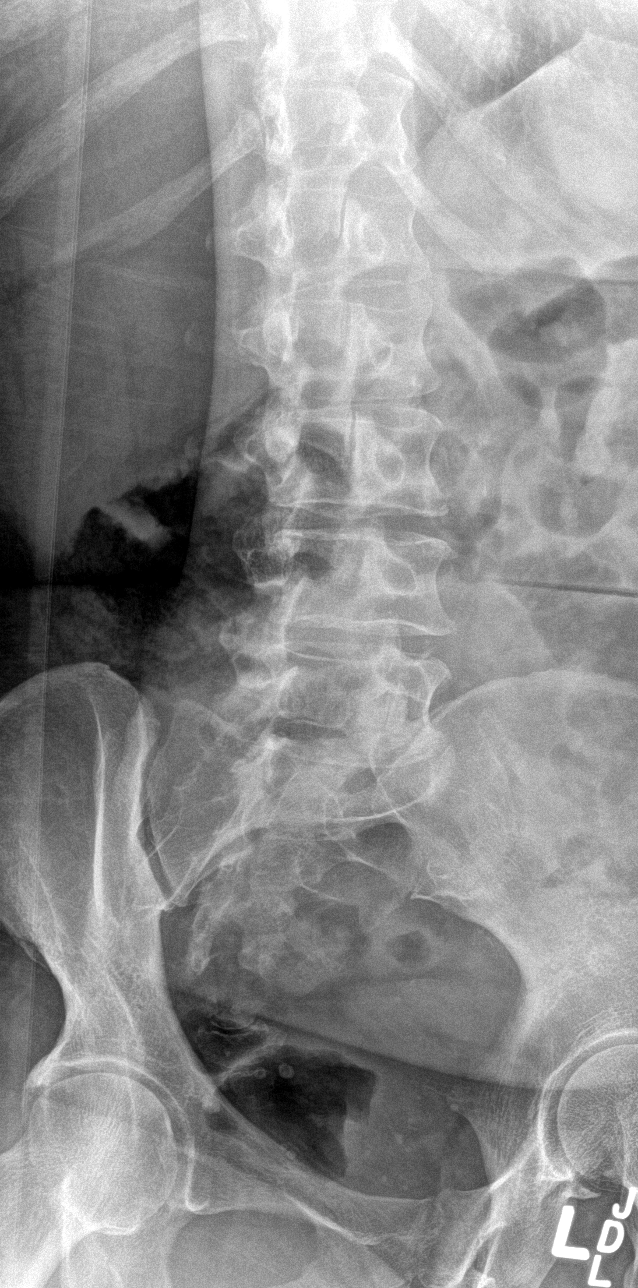

[l-spine lat]
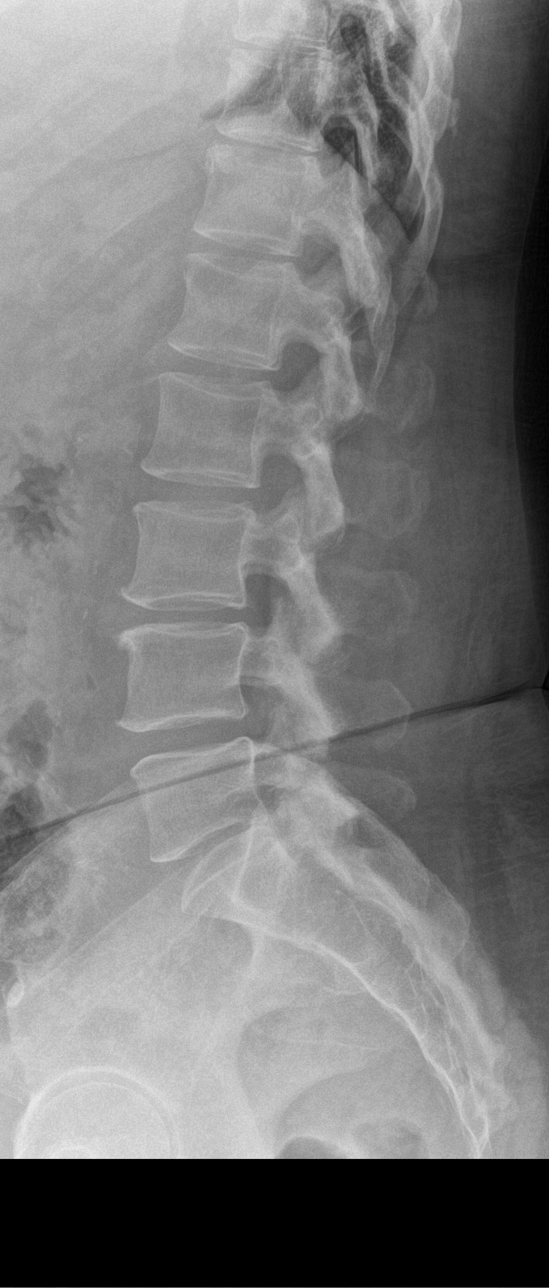

[l-spine spot]
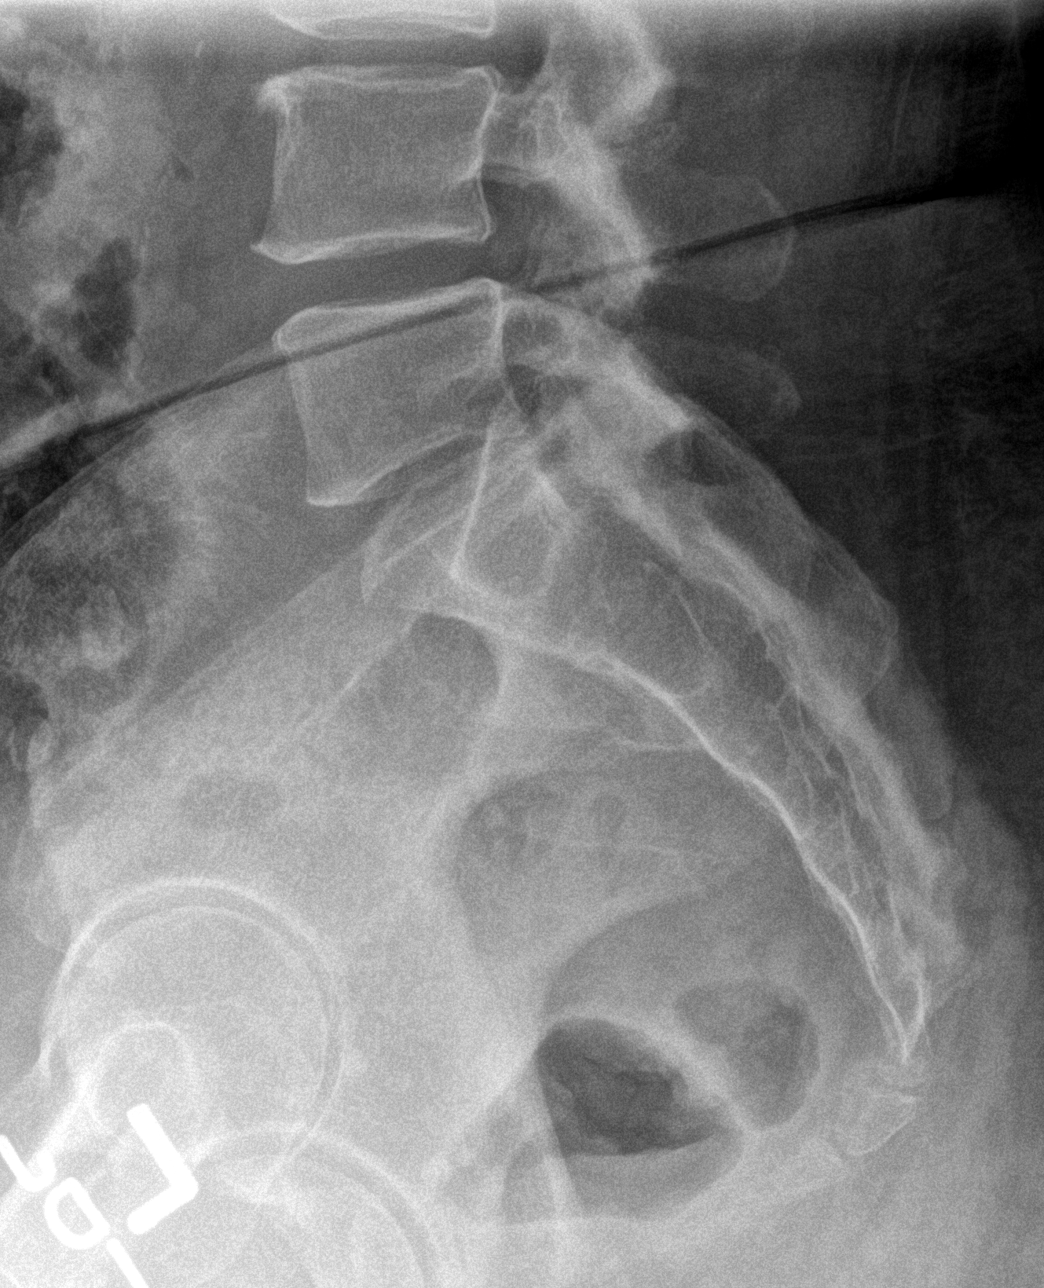

[5 of 5 positions shown; findings below may reference images not displayed]

FINDINGS: Frontal, lateral, spot lumbosacral lateral, and bilateral oblique
views were obtained. There are 5 non-rib-bearing lumbar type
vertebral bodies. There is no fracture or spondylolisthesis. There
is slight disc space narrowing at L5-S1. Other disc spaces appear
unremarkable. There are small anterior osteophytes at L3 and L4.
There is facet osteoarthritic change at L5-S1 bilaterally.
IMPRESSION: Mild osteoarthritic change.  No fracture or spondylolisthesis.

## 2016-08-08 ENCOUNTER — Encounter: Payer: Self-pay | Admitting: Internal Medicine

## 2016-08-08 ENCOUNTER — Ambulatory Visit (INDEPENDENT_AMBULATORY_CARE_PROVIDER_SITE_OTHER): Payer: BLUE CROSS/BLUE SHIELD | Admitting: Internal Medicine

## 2016-08-08 VITALS — BP 136/88 | HR 72 | Temp 97.7°F | Resp 16 | Ht 60.0 in | Wt 196.4 lb

## 2016-08-08 DIAGNOSIS — Z1212 Encounter for screening for malignant neoplasm of rectum: Secondary | ICD-10-CM

## 2016-08-08 DIAGNOSIS — I10 Essential (primary) hypertension: Secondary | ICD-10-CM | POA: Diagnosis not present

## 2016-08-08 DIAGNOSIS — R5383 Other fatigue: Secondary | ICD-10-CM

## 2016-08-08 DIAGNOSIS — Z0000 Encounter for general adult medical examination without abnormal findings: Secondary | ICD-10-CM | POA: Diagnosis not present

## 2016-08-08 DIAGNOSIS — Z136 Encounter for screening for cardiovascular disorders: Secondary | ICD-10-CM

## 2016-08-08 DIAGNOSIS — E119 Type 2 diabetes mellitus without complications: Secondary | ICD-10-CM

## 2016-08-08 DIAGNOSIS — Z79899 Other long term (current) drug therapy: Secondary | ICD-10-CM | POA: Diagnosis not present

## 2016-08-08 DIAGNOSIS — Z794 Long term (current) use of insulin: Secondary | ICD-10-CM

## 2016-08-08 DIAGNOSIS — E559 Vitamin D deficiency, unspecified: Secondary | ICD-10-CM

## 2016-08-08 DIAGNOSIS — E785 Hyperlipidemia, unspecified: Secondary | ICD-10-CM

## 2016-08-08 DIAGNOSIS — Z0001 Encounter for general adult medical examination with abnormal findings: Secondary | ICD-10-CM

## 2016-08-08 LAB — CBC WITH DIFFERENTIAL/PLATELET
Basophils Absolute: 48 cells/uL (ref 0–200)
Basophils Relative: 1 %
Eosinophils Absolute: 240 cells/uL (ref 15–500)
Eosinophils Relative: 5 %
HCT: 29.5 % — ABNORMAL LOW (ref 35.0–45.0)
Hemoglobin: 9.4 g/dL — ABNORMAL LOW (ref 11.7–15.5)
Lymphocytes Relative: 36 %
Lymphs Abs: 1728 cells/uL (ref 850–3900)
MCH: 28.3 pg (ref 27.0–33.0)
MCHC: 31.9 g/dL — ABNORMAL LOW (ref 32.0–36.0)
MCV: 88.9 fL (ref 80.0–100.0)
MPV: 10.6 fL (ref 7.5–12.5)
Monocytes Absolute: 624 cells/uL (ref 200–950)
Monocytes Relative: 13 %
Neutro Abs: 2160 cells/uL (ref 1500–7800)
Neutrophils Relative %: 45 %
Platelets: 249 10*3/uL (ref 140–400)
RBC: 3.32 MIL/uL — ABNORMAL LOW (ref 3.80–5.10)
RDW: 18.9 % — ABNORMAL HIGH (ref 11.0–15.0)
WBC: 4.8 10*3/uL (ref 3.8–10.8)

## 2016-08-08 NOTE — Patient Instructions (Signed)
Preventive Care for Adults  A healthy lifestyle and preventive care can promote health and wellness. Preventive health guidelines for women include the following key practices.  A routine yearly physical is a good way to check with your health care provider about your health and preventive screening. It is a chance to share any concerns and updates on your health and to receive a thorough exam.  Visit your dentist for a routine exam and preventive care every 6 months. Brush your teeth twice a day and floss once a day. Good oral hygiene prevents tooth decay and gum disease.  The frequency of eye exams is based on your age, health, family medical history, use of contact lenses, and other factors. Follow your health care provider's recommendations for frequency of eye exams.  Eat a healthy diet. Foods like vegetables, fruits, whole grains, low-fat dairy products, and lean protein foods contain the nutrients you need without too many calories. Decrease your intake of foods high in solid fats, added sugars, and salt. Eat the right amount of calories for you.Get information about a proper diet from your health care provider, if necessary.  Regular physical exercise is one of the most important things you can do for your health. Most adults should get at least 150 minutes of moderate-intensity exercise (any activity that increases your heart rate and causes you to sweat) each week. In addition, most adults need muscle-strengthening exercises on 2 or more days a week.  Maintain a healthy weight. The body mass index (BMI) is a screening tool to identify possible weight problems. It provides an estimate of body fat based on height and weight. Your health care provider can find your BMI and can help you achieve or maintain a healthy weight.For adults 20 years and older:  A BMI below 18.5 is considered underweight.  A BMI of 18.5 to 24.9 is normal.  A BMI of 25 to 29.9 is considered overweight.  A BMI of  30 and above is considered obese.  Maintain normal blood lipids and cholesterol levels by exercising and minimizing your intake of saturated fat. Eat a balanced diet with plenty of fruit and vegetables. Blood tests for lipids and cholesterol should begin at age 20 and be repeated every 5 years. If your lipid or cholesterol levels are high, you are over 50, or you are at high risk for heart disease, you may need your cholesterol levels checked more frequently.Ongoing high lipid and cholesterol levels should be treated with medicines if diet and exercise are not working.  If you smoke, find out from your health care provider how to quit. If you do not use tobacco, do not start.  Lung cancer screening is recommended for adults aged 55-80 years who are at high risk for developing lung cancer because of a history of smoking. A yearly low-dose CT scan of the lungs is recommended for people who have at least a 30-pack-year history of smoking and are a current smoker or have quit within the past 15 years. A pack year of smoking is smoking an average of 1 pack of cigarettes a day for 1 year (for example: 1 pack a day for 30 years or 2 packs a day for 15 years). Yearly screening should continue until the smoker has stopped smoking for at least 15 years. Yearly screening should be stopped for people who develop a health problem that would prevent them from having lung cancer treatment.  High blood pressure causes heart disease and increases the risk of   stroke. Your blood pressure should be checked at least every 1 to 2 years. Ongoing high blood pressure should be treated with medicines if weight loss and exercise do not work.  If you are 55-79 years old, ask your health care provider if you should take aspirin to prevent strokes.  Diabetes screening involves taking a blood sample to check your fasting blood sugar level. This should be done once every 3 years, after age 45, if you are within normal weight and  without risk factors for diabetes. Testing should be considered at a younger age or be carried out more frequently if you are overweight and have at least 1 risk factor for diabetes.  Breast cancer screening is essential preventive care for women. You should practice "breast self-awareness." This means understanding the normal appearance and feel of your breasts and may include breast self-examination. Any changes detected, no matter how small, should be reported to a health care provider. Women in their 20s and 30s should have a clinical breast exam (CBE) by a health care provider as part of a regular health exam every 1 to 3 years. After age 40, women should have a CBE every year. Starting at age 40, women should consider having a mammogram (breast X-ray test) every year. Women who have a family history of breast cancer should talk to their health care provider about genetic screening. Women at a high risk of breast cancer should talk to their health care providers about having an MRI and a mammogram every year.  Breast cancer gene (BRCA)-related cancer risk assessment is recommended for women who have family members with BRCA-related cancers. BRCA-related cancers include breast, ovarian, tubal, and peritoneal cancers. Having family members with these cancers may be associated with an increased risk for harmful changes (mutations) in the breast cancer genes BRCA1 and BRCA2. Results of the assessment will determine the need for genetic counseling and BRCA1 and BRCA2 testing.  Routine pelvic exams to screen for cancer are no longer recommended for nonpregnant women who are considered low risk for cancer of the pelvic organs (ovaries, uterus, and vagina) and who do not have symptoms. Ask your health care provider if a screening pelvic exam is right for you.  If you have had past treatment for cervical cancer or a condition that could lead to cancer, you need Pap tests and screening for cancer for at least 20  years after your treatment. If Pap tests have been discontinued, your risk factors (such as having a new sexual partner) need to be reassessed to determine if screening should be resumed. Some women have medical problems that increase the chance of getting cervical cancer. In these cases, your health care provider may recommend more frequent screening and Pap tests.  Colorectal cancer can be detected and often prevented. Most routine colorectal cancer screening begins at the age of 50 years and continues through age 75 years. However, your health care provider may recommend screening at an earlier age if you have risk factors for colon cancer. On a yearly basis, your health care provider may provide home test kits to check for hidden blood in the stool. Use of a small camera at the end of a tube, to directly examine the colon (sigmoidoscopy or colonoscopy), can detect the earliest forms of colorectal cancer. Talk to your health care provider about this at age 50, when routine screening begins. Direct exam of the colon should be repeated every 5-10 years through age 75 years, unless early forms of pre-cancerous   polyps or small growths are found.  Hepatitis C blood testing is recommended for all people born from 1945 through 1965 and any individual with known risks for hepatitis C.  Pra  Osteoporosis is a disease in which the bones lose minerals and strength with aging. This can result in serious bone fractures or breaks. The risk of osteoporosis can be identified using a bone density scan. Women ages 65 years and over and women at risk for fractures or osteoporosis should discuss screening with their health care providers. Ask your health care provider whether you should take a calcium supplement or vitamin D to reduce the rate of osteoporosis.  Menopause can be associated with physical symptoms and risks. Hormone replacement therapy is available to decrease symptoms and risks. You should talk to your  health care provider about whether hormone replacement therapy is right for you.  Use sunscreen. Apply sunscreen liberally and repeatedly throughout the day. You should seek shade when your shadow is shorter than you. Protect yourself by wearing long sleeves, pants, a wide-brimmed hat, and sunglasses year round, whenever you are outdoors.  Once a month, do a whole body skin exam, using a mirror to look at the skin on your back. Tell your health care provider of new moles, moles that have irregular borders, moles that are larger than a pencil eraser, or moles that have changed in shape or color.  Stay current with required vaccines (immunizations).  Influenza vaccine. All adults should be immunized every year.  Tetanus, diphtheria, and acellular pertussis (Td, Tdap) vaccine. Pregnant women should receive 1 dose of Tdap vaccine during each pregnancy. The dose should be obtained regardless of the length of time since the last dose. Immunization is preferred during the 27th-36th week of gestation. An adult who has not previously received Tdap or who does not know her vaccine status should receive 1 dose of Tdap. This initial dose should be followed by tetanus and diphtheria toxoids (Td) booster doses every 10 years. Adults with an unknown or incomplete history of completing a 3-dose immunization series with Td-containing vaccines should begin or complete a primary immunization series including a Tdap dose. Adults should receive a Td booster every 10 years.  Varicella vaccine. An adult without evidence of immunity to varicella should receive 2 doses or a second dose if she has previously received 1 dose. Pregnant females who do not have evidence of immunity should receive the first dose after pregnancy. This first dose should be obtained before leaving the health care facility. The second dose should be obtained 4-8 weeks after the first dose.  Human papillomavirus (HPV) vaccine. Females aged 13-26 years  who have not received the vaccine previously should obtain the 3-dose series. The vaccine is not recommended for use in pregnant females. However, pregnancy testing is not needed before receiving a dose. If a female is found to be pregnant after receiving a dose, no treatment is needed. In that case, the remaining doses should be delayed until after the pregnancy. Immunization is recommended for any person with an immunocompromised condition through the age of 26 years if she did not get any or all doses earlier. During the 3-dose series, the second dose should be obtained 4-8 weeks after the first dose. The third dose should be obtained 24 weeks after the first dose and 16 weeks after the second dose.  Zoster vaccine. One dose is recommended for adults aged 60 years or older unless certain conditions are present.  Measles, mumps, and rubella (  MMR) vaccine. Adults born before [birth year] generally are considered immune to measles and mumps. Adults born in [birth year] or later should have 1 or more doses of MMR vaccine unless there is a contraindication to the vaccine or there is laboratory evidence of immunity to each of the three diseases. A routine second dose of MMR vaccine should be obtained at least 28 days after the first dose for students attending postsecondary schools, health care workers, or international travelers. People who received inactivated measles vaccine or an unknown type of measles vaccine during 1963-1967 should receive 2 doses of MMR vaccine. People who received inactivated mumps vaccine or an unknown type of mumps vaccine before 1979 and are at high risk for mumps infection should consider immunization with 2 doses of MMR vaccine. For females of childbearing age, rubella immunity should be determined. If there is no evidence of immunity, females who are not pregnant should be vaccinated. If there is no evidence of immunity, females who are pregnant should delay immunization until after pregnancy.  Unvaccinated health care workers born before [birth year] who lack laboratory evidence of measles, mumps, or rubella immunity or laboratory confirmation of disease should consider measles and mumps immunization with 2 doses of MMR vaccine or rubella immunization with 1 dose of MMR vaccine.  Pneumococcal 13-valent conjugate (PCV13) vaccine. When indicated, a person who is uncertain of her immunization history and has no record of immunization should receive the PCV13 vaccine. An adult aged 39 years or older who has certain medical conditions and has not been previously immunized should receive 1 dose of PCV13 vaccine. This PCV13 should be followed with a dose of pneumococcal polysaccharide (PPSV23) vaccine. The PPSV23 vaccine dose should be obtained at least 8 weeks after the dose of PCV13 vaccine. An adult aged 62 years or older who has certain medical conditions and previously received 1 or more doses of PPSV23 vaccine should receive 1 dose of PCV13. The PCV13 vaccine dose should be obtained 1 or more years after the last PPSV23 vaccine dose.    Pneumococcal polysaccharide (PPSV23) vaccine. When PCV13 is also indicated, PCV13 should be obtained first. All adults aged 67 years and older should be immunized. An adult younger than age 45 years who has certain medical conditions should be immunized. Any person who resides in a nursing home or long-term care facility should be immunized. An adult smoker should be immunized. People with an immunocompromised condition and certain other conditions should receive both PCV13 and PPSV23 vaccines. People with human immunodeficiency virus (HIV) infection should be immunized as soon as possible after diagnosis. Immunization during chemotherapy or radiation therapy should be avoided. Routine use of PPSV23 vaccine is not recommended for American Indians, [place] Natives, or people younger than 65 years unless there are medical conditions that require PPSV23 vaccine. When indicated,  people who have unknown immunization and have no record of immunization should receive PPSV23 vaccine. One-time revaccination 5 years after the first dose of PPSV23 is recommended for people aged 19-64 years who have chronic kidney failure, nephrotic syndrome, asplenia, or immunocompromised conditions. People who received 1-2 doses of PPSV23 before age 23 years should receive another dose of PPSV23 vaccine at age 35 years or later if at least 5 years have passed since the previous dose. Doses of PPSV23 are not needed for people immunized with PPSV23 at or after age 38 years.  Preventive Services / Frequency   Ages 43 to 86 years  Blood pressure check.  Lipid and cholesterol check.  Lung  cancer screening. / Every year if you are aged 55-80 years and have a 30-pack-year history of smoking and currently smoke or have quit within the past 15 years. Yearly screening is stopped once you have quit smoking for at least 15 years or develop a health problem that would prevent you from having lung cancer treatment.  Clinical breast exam.** / Every year after age 40 years.  BRCA-related cancer risk assessment.** / For women who have family members with a BRCA-related cancer (breast, ovarian, tubal, or peritoneal cancers).  Mammogram.** / Every year beginning at age 40 years and continuing for as long as you are in good health. Consult with your health care provider.  Pap test.** / Every 3 years starting at age 30 years through age 65 or 70 years with a history of 3 consecutive normal Pap tests.  HPV screening.** / Every 3 years from ages 30 years through ages 65 to 70 years with a history of 3 consecutive normal Pap tests.  Fecal occult blood test (FOBT) of stool. / Every year beginning at age 50 years and continuing until age 75 years. You may not need to do this test if you get a colonoscopy every 10 years.  Flexible sigmoidoscopy or colonoscopy.** / Every 5 years for a flexible sigmoidoscopy or  every 10 years for a colonoscopy beginning at age 50 years and continuing until age 75 years.  Hepatitis C blood test.** / For all people born from 1945 through 1965 and any individual with known risks for hepatitis C.  Skin self-exam. / Monthly.  Influenza vaccine. / Every year.  Tetanus, diphtheria, and acellular pertussis (Tdap/Td) vaccine.** / Consult your health care provider. Pregnant women should receive 1 dose of Tdap vaccine during each pregnancy. 1 dose of Td every 10 years.  Varicella vaccine.** / Consult your health care provider. Pregnant females who do not have evidence of immunity should receive the first dose after pregnancy.  Zoster vaccine.** / 1 dose for adults aged 60 years or older.  Pneumococcal 13-valent conjugate (PCV13) vaccine.** / Consult your health care provider.  Pneumococcal polysaccharide (PPSV23) vaccine.** / 1 to 2 doses if you smoke cigarettes or if you have certain conditions.  Meningococcal vaccine.** / Consult your health care provider.  Hepatitis A vaccine.** / Consult your health care provider.  Hepatitis B vaccine.** / Consult your health care provider. Screening for abdominal aortic aneurysm (AAA)  by ultrasound is recommended for people over 50 who have history of high blood pressure or who are current or former smokers. ++++++++++++++++++ Recommend Adult Low Dose Aspirin or  coated  Aspirin 81 mg daily  To reduce risk of Colon Cancer 20 %,  Skin Cancer 26 % ,  Melanoma 46%  and  Pancreatic cancer 60% +++++++++++++++++++ Vitamin D goal  is between 70-100.  Please make sure that you are taking your Vitamin D as directed.  It is very important as a natural anti-inflammatory  helping hair, skin, and nails, as well as reducing stroke and heart attack risk.  It helps your bones and helps with mood. It also decreases numerous cancer risks so please take it as directed.  Low Vit D is associated with a 200-300% higher risk for CANCER  and  200-300% higher risk for HEART   ATTACK  &  STROKE.   ...................................... It is also associated with higher death rate at younger ages,  autoimmune diseases like Rheumatoid arthritis, Lupus, Multiple Sclerosis.    Also many other serious conditions, like depression,   Alzheimer's Dementia, infertility, muscle aches, fatigue, fibromyalgia - just to name a few. ++++++++++++++++++ Recommend the book "The END of DIETING" by Dr Joel Fuhrman  & the book "The END of DIABETES " by Dr Joel Fuhrman At [employer] - get book & Audio CD's    Being diabetic has a  300% increased risk for heart attack, stroke, cancer, and alzheimer- type vascular dementia. It is very important that you work harder with diet by avoiding all foods that are white. Avoid white rice (brown & wild rice is OK), white potatoes (sweetpotatoes in moderation is OK), White bread or wheat bread or anything made out of white flour like bagels, donuts, rolls, buns, biscuits, cakes, pastries, cookies, pizza crust, and pasta (made from white flour & egg whites) - vegetarian pasta or spinach or wheat pasta is OK. Multigrain breads like Arnold's or [employer], or multigrain sandwich thins or flatbreads.  Diet, exercise and weight loss can reverse and cure diabetes in the early stages.  Diet, exercise and weight loss is very important in the control and prevention of complications of diabetes which affects every system in your body, ie. Brain - dementia/stroke, eyes - glaucoma/blindness, heart - heart attack/heart failure, kidneys - dialysis, stomach - gastric paralysis, intestines - malabsorption, nerves - severe painful neuritis, circulation - gangrene & loss of a leg(s), and finally cancer and Alzheimers.    I recommend avoid fried & greasy foods,  sweets/candy, white rice (brown or wild rice or Quinoa is OK), white potatoes (sweet potatoes are OK) - anything made from white flour - bagels, doughnuts, rolls, buns, biscuits,white  and wheat breads, pizza crust and traditional pasta made of white flour & egg white(vegetarian pasta or spinach or wheat pasta is OK).  Multi-grain bread is OK - like multi-grain flat bread or sandwich thins. Avoid alcohol in excess. Exercise is also important.    Eat all the vegetables you want - avoid meat, especially red meat and dairy - especially cheese.  Cheese is the most concentrated form of trans-fats which is the worst thing to clog up our arteries. Veggie cheese is OK which can be found in the fresh produce section at [employer] or [employer] or [employer]  ++++++++++++++++++++++ DASH Eating Plan  DASH stands for "Dietary Approaches to Stop Hypertension."   The DASH eating plan is a healthy eating plan that has been shown to reduce high blood pressure (hypertension). Additional health benefits may include reducing the risk of type 2 diabetes mellitus, heart disease, and stroke. The DASH eating plan may also help with weight loss. WHAT DO I NEED TO KNOW ABOUT THE DASH EATING PLAN? For the DASH eating plan, you will follow these general guidelines:  Choose foods with a percent daily value for sodium of less than 5% (as listed on the food label).  Use salt-free seasonings or herbs instead of table salt or sea salt.  Check with your health care provider or pharmacist before using salt substitutes.  Eat lower-sodium products, often labeled as "lower sodium" or "no salt added."  Eat fresh foods.  Eat more vegetables, fruits, and low-fat dairy products.  Choose whole grains. Look for the word "whole" as the first word in the ingredient list.  Choose fish   Limit sweets, desserts, sugars, and sugary drinks.  Choose heart-healthy fats.  Eat veggie cheese   Eat more home-cooked food and less restaurant, buffet, and fast food.  Limit fried foods.  Cook foods using methods other than frying.  Limit canned   vegetables. If you do use them, rinse them well to decrease the  sodium.  When eating at a restaurant, ask that your food be prepared with less salt, or no salt if possible.                      WHAT FOODS CAN I EAT? Read Dr Joel Fuhrman's books on The End of Dieting & The End of Diabetes  Grains Whole grain or whole wheat bread. Brown rice. Whole grain or whole wheat pasta. Quinoa, bulgur, and whole grain cereals. Low-sodium cereals. Corn or whole wheat flour tortillas. Whole grain cornbread. Whole grain crackers. Low-sodium crackers.  Vegetables Fresh or frozen vegetables (raw, steamed, roasted, or grilled). Low-sodium or reduced-sodium tomato and vegetable juices. Low-sodium or reduced-sodium tomato sauce and paste. Low-sodium or reduced-sodium canned vegetables.   Fruits All fresh, canned (in natural juice), or frozen fruits.  Protein Products  All fish and seafood.  Dried beans, peas, or lentils. Unsalted nuts and seeds. Unsalted canned beans.  Dairy Low-fat dairy products, such as skim or 1% milk, 2% or reduced-fat cheeses, low-fat ricotta or cottage cheese, or plain low-fat yogurt. Low-sodium or reduced-sodium cheeses.  Fats and Oils Tub margarines without trans fats. Light or reduced-fat mayonnaise and salad dressings (reduced sodium). Avocado. Safflower, olive, or canola oils. Natural peanut or almond butter.  Other Unsalted popcorn and pretzels. The items listed above may not be a complete list of recommended foods or beverages. Contact your dietitian for more options.  ++++++++++++++++++  WHAT FOODS ARE NOT RECOMMENDED? Grains/ White flour or wheat flour White bread. White pasta. White rice. Refined cornbread. Bagels and croissants. Crackers that contain trans fat.  Vegetables  Creamed or fried vegetables. Vegetables in a . Regular canned vegetables. Regular canned tomato sauce and paste. Regular tomato and vegetable juices.  Fruits Dried fruits. Canned fruit in light or heavy syrup. Fruit juice.  Meat and Other Protein  Products Meat in general - RED meat & White meat.  Fatty cuts of meat. Ribs, chicken wings, all processed meats as bacon, sausage, bologna, salami, fatback, hot dogs, bratwurst and packaged luncheon meats.  Dairy Whole or 2% milk, cream, half-and-half, and cream cheese. Whole-fat or sweetened yogurt. Full-fat cheeses or blue cheese. Non-dairy creamers and whipped toppings. Processed cheese, cheese spreads, or cheese curds.  Condiments Onion and garlic salt, seasoned salt, table salt, and sea salt. Canned and packaged gravies. Worcestershire sauce. Tartar sauce. Barbecue sauce. Teriyaki sauce. Soy sauce, including reduced sodium. Steak sauce. Fish sauce. Oyster sauce. Cocktail sauce. Horseradish. Ketchup and mustard. Meat flavorings and tenderizers. Bouillon cubes. Hot sauce. Tabasco sauce. Marinades. Taco seasonings. Relishes.  Fats and Oils Butter, stick margarine, lard, shortening and bacon fat. Coconut, palm kernel, or palm oils. Regular salad dressings.  Pickles and olives. Salted popcorn and pretzels.  The items listed above may not be a complete list of foods and beverages to avoid.    

## 2016-08-08 NOTE — Progress Notes (Signed)
[facility]                   Unk Pinto, M.D.    Uvaldo Bristle. Silverio Lay, P.A.-C      Starlyn Skeans, P.A.-C  [facility]                [address] [place] [address] Telephone [phone] Telefax [phone]  Annual Screening/Preventative Visit  & Comprehensive Evaluation &  Examination     This very nice 60 y.o. MBM presents for a Screening/Preventative Visit & comprehensive evaluation and management of multiple medical co-morbidities.  Patient has been followed for HTN, T2_NIDDM, Hyperlipidemia, ESRD and Vitamin D Deficiency.      HTN predates since circa 1999. She had hx/o premorbid underlying chronic kidney disease attributed to her HTN and Diabetes.  In 06/2015 she underwent minimally evasive Mitral valve repair and post op was initiated on Dialysis for ESRD. Patient's BP has been controlled at home and patient denies any cardiac symptoms as chest pain, palpitations, shortness of breath, dizziness or ankle swelling. Today's BP is  136/88      Patient's hyperlipidemia is controlled with diet and medications. Patient denies myalgias or other medication SE's. Last lipids were at goal: Lab Results  Component Value Date   CHOL 160 03/02/2016   HDL 51 03/02/2016   LDLCALC 79 03/02/2016   TRIG 149 03/02/2016   CHOLHDL 3.1 03/02/2016      Patient has Morbid Obesity (BMI 38+) and consequent Insulin Requiring T2_NIDDM predating since 2006 and patient denies reactive hypoglycemic symptoms, visual blurring, diabetic polys, or paresthesias. Last A1c was  Lab Results  Component Value Date   HGBA1C 5.6 03/02/2016      Finally, patient has history of Vitamin D Deficiency of "12" in 2008 and last Vitamin D was still low: Lab Results  Component Value Date   VD25OH 44 03/02/2016   Current Outpatient Prescriptions on File Prior to Visit  Medication Sig  . aspirin EC 81 MG tablet Take 81 mg by mouth  daily.  . busPIRone (BUSPAR) 10 MG tablet Take 1 tablet (10 mg total) by mouth 2 (two) times daily.  . carvedilol (COREG) 6.25 MG tablet Take 1 tablet (6.25 mg total) by mouth 2 (two) times daily.  . insulin aspart protamine - aspart (NOVOLOG MIX 70/30 FLEXPEN) (70-30) 100 UNIT/ML FlexPen Inject 0.1 mLs (10 Units total) into the skin 2 (two) times daily. (Patient taking differently: Inject 4 Units into the skin 2 (two) times daily as needed (If sugar goes above 180). )  . RENVELA 800 MG tablet Take 800-2,400 mg by mouth 3 (three) times daily. 2400 mg (3 caps) with meals and 800 mg (1 cap) with snacks  . rosuvastatin (CRESTOR) 20 MG tablet Take 1 tablet (20 mg total) by mouth daily.  . sertraline (ZOLOFT) 25 MG tablet Take 1 tablet (25 mg total) by mouth daily.   No current facility-administered medications on file prior to visit.    Allergies  Allergen Reactions  . Minoxidil Other (See Comments)    Pericardial effusion  . Lipitor [Atorvastatin] Other (See Comments)    REACTION: "pain in legs" Tolerates rosuvastatin  . Ace Inhibitors Other (See Comments)    REACTION: "not sure...think it made me drowsy all the time"   Past Medical History:  Diagnosis Date  . Anemia   .  Chronic diastolic CHF (congestive heart failure) (HCC)    takes Furosemide daily  . Chronic diastolic heart failure ([place])   . CKD (chronic kidney disease) stage 4, GFR 15-29 ml/min (HCC)   . Constipation   . CVA (cerebral infarction) 1997   no residual deficit  . Diverticulitis   . DM (diabetes mellitus) ([place])    takes Novolog and Metformin daily  . Heart murmur   . History of cardiovascular stress test    a. Myoview Oct 2012 showed EF 49%, no ischemia, LVE  . HLD (hyperlipidemia)    takes Crestor daily  . Hypertension    takes Hydralazine,Losartan,and Labetalol daily  . Pericardial effusion    chronic; felt to be poss related to minoxidil >> DC'd  . Pulmonary hypertension ([place]) 06/18/2015  . Respiratory  failure, acute hypoxic, post-operative 07/08/2015   Requiring ECMO support  . S/P cardiac catheterization    a. R/L HC 06/18/15:  mLAD 30%; severe pulmo HTN with PA sat 43%, CI 1.86, prominent V waves indicative of MR; resting hypoxemia O2 sat 86% on RA  . S/P minimally invasive mitral valve repair 07/08/2015   Complex valvuloplasty including artificial Gore-tex neochord placement x10 and 26 mm Sorin Memo 3D Rechord ring annuloplasty via right mini thoracotomy approach  . Severe mitral regurgitation   . Shortness of breath dyspnea   . Stroke ([place]) 1997   no residual effect  . Vitamin D deficiency   . Wears glasses    Health Maintenance  Topic Date Due  . PNEUMOCOCCAL POLYSACCHARIDE VACCINE (2) 07/28/2013  . ZOSTAVAX  03/05/2015  . PAP SMEAR  03/22/2015  . FOOT EXAM  03/28/2016  . URINE MICROALBUMIN  03/28/2016  . INFLUENZA VACCINE  06/27/2016  . HEMOGLOBIN A1C  09/01/2016  . OPHTHALMOLOGY EXAM  04/05/2017  . MAMMOGRAM  07/13/2018  . TETANUS/TDAP  12/28/2018  . COLONOSCOPY  01/18/2022  . Hepatitis C Screening  Completed  . HIV Screening  Completed   Immunization History  Administered Date(s) Administered  . Influenza Split 08/20/2012  . PPD Test 03/25/2014, 03/29/2015  . Pneumococcal-Unspecified 07/28/2008  . Tdap 12/28/2008   Past Surgical History:  Procedure Laterality Date  . AV FISTULA PLACEMENT Left 10/22/2015   Procedure: RADIOCEPHALIC ARTERIOVENOUS (AV) FISTULA CREATION;  Surgeon: Serafina Mitchell, MD;  Location: [facility];  Service: Vascular;  Laterality: Left;  . CANNULATION FOR CARDIOPULMONARY BYPASS N/A 07/08/2015   Procedure: CANNULATION FOR ECMO;  Surgeon: Rexene Alberts, MD;  Location: [facility];  Service: Open Heart Surgery;  Laterality: N/A;  . CARDIAC CATHETERIZATION    . CARDIAC CATHETERIZATION N/A 06/18/2015   Procedure: Right/Left Heart Cath and Coronary Angiography;  Surgeon: Jettie Booze, MD;  Location: [place] [facility];  Service: Cardiovascular;   Laterality: N/A;  . CESAREAN SECTION     x 2  . COLONOSCOPY    . FISTULA SUPERFICIALIZATION Left 12/28/2015   Procedure: SUPERFICIALIZATION LEFT RADIOCEPHALIC FISTULA;  Surgeon: Serafina Mitchell, MD;  Location: [facility];  Service: Vascular;  Laterality: Left;  . MITRAL VALVE REPAIR Right 07/08/2015   Procedure: MINIMALLY INVASIVE MITRAL VALVE REPAIR with a 26 Sorin Memo 3D Rechord;  Surgeon: Rexene Alberts, MD;  Location: [place] [facility];  Service: Open Heart Surgery;  Laterality: Right;  . MULTIPLE EXTRACTIONS WITH ALVEOLOPLASTY N/A 06/30/2015   Procedure: MULTIPLE EXTRACTIONS OF TOOTH #'S 4  AND 30 WITH ALVEOLOPLASTY AND GROSS DEBRIDEMENT  OF REMAINING TEETH;  Surgeon: Lenn Cal, DDS;  Location: [facility];  Service: Oral Surgery;  Laterality: N/A;  . PERIPHERAL VASCULAR CATHETERIZATION Left 03/28/2016   Procedure: A/V Fistulagram;  Surgeon: Serafina Mitchell, MD;  Location: [place] [facility];  Service: Cardiovascular;  Laterality: Left;  lower arm  . TEE WITHOUT CARDIOVERSION N/A 06/08/2015   Procedure: TRANSESOPHAGEAL ECHOCARDIOGRAM (TEE);  Surgeon: Lelon Perla, MD;  Location: [facility] ENDOSCOPY;  Service: Cardiovascular;  Laterality: N/A;  . TEE WITHOUT CARDIOVERSION N/A 07/08/2015   Procedure: TRANSESOPHAGEAL ECHOCARDIOGRAM (TEE);  Surgeon: Rexene Alberts, MD;  Location: [place];  Service: Open Heart Surgery;  Laterality: N/A;   Family History  Problem Relation Age of Onset  . Cancer Sister     breast cancer  . Stroke Mother   . Heart attack Brother     MI in his 49s  . Heart disease Brother     before age 65  . Breast cancer Sister    Social History  Substance Use Topics  . Smoking status: Never Smoker  . Smokeless tobacco: Never Used  . Alcohol use No    ROS Constitutional: Denies fever, chills, weight loss/gain, headaches, insomnia,  night sweats, and change in appetite. Does c/o fatigue. Eyes: Denies redness, blurred vision, diplopia, discharge, itchy, watery eyes.  ENT: Denies discharge,  congestion, post nasal drip, epistaxis, sore throat, earache, hearing loss, dental pain, Tinnitus, Vertigo, Sinus pain, snoring.  Cardio: Denies chest pain, palpitations, irregular heartbeat, syncope, dyspnea, diaphoresis, orthopnea, PND, claudication, edema Respiratory: denies cough, dyspnea, DOE, pleurisy, hoarseness, laryngitis, wheezing.  Gastrointestinal: Denies dysphagia, heartburn, reflux, water brash, pain, cramps, nausea, vomiting, bloating, diarrhea, constipation, hematemesis, melena, hematochezia, jaundice, hemorrhoids Genitourinary: Denies dysuria, frequency, urgency, nocturia, hesitancy, discharge, hematuria, flank pain Breast: Breast lumps, nipple discharge, bleeding.  Musculoskeletal: Denies arthralgia, myalgia, stiffness, Jt. Swelling, pain, limp, and strain/sprain. Denies falls. Skin: Denies puritis, rash, hives, warts, acne, eczema, changing in skin lesion Neuro: No weakness, tremor, incoordination, spasms, paresthesia, pain Psychiatric: Denies confusion, memory loss, sensory loss. Denies Depression. Endocrine: Denies change in weight, skin, hair change, nocturia, and paresthesia, diabetic polys, visual blurring, hyper / hypo glycemic episodes.  Heme/Lymph: No excessive bleeding, bruising, enlarged lymph nodes.  Physical Exam  BP 136/88   Pulse 72   Temp 97.7 F (36.5 C)   Resp 16   Ht 5' (1.524 m)   Wt 196 lb 6.4 oz (89.1 kg)   BMI 38.36 kg/m   General Appearance: Well nourished and in no apparent distress.  Eyes: PERRLA, EOMs, conjunctiva no swelling or erythema, normal fundi and vessels. Sinuses: No frontal/maxillary tenderness ENT/Mouth: EACs patent / TMs  nl. Nares clear without erythema, swelling, mucoid exudates. Oral hygiene is good. No erythema, swelling, or exudate. Tongue normal, non-obstructing. Tonsils not swollen or erythematous. Hearing normal.  Neck: Supple, thyroid normal. No bruits, nodes or JVD. Respiratory: Respiratory effort normal.  BS equal and  clear bilateral without rales, rhonci, wheezing or stridor. Cardio: Heart sounds are normal with regular rate and rhythm and no murmurs, rubs or gallops. Peripheral pulses are normal and equal bilaterally without edema. No aortic or femoral bruits. Chest: symmetric with normal excursions and percussion. Breasts: Symmetric, without lumps, nipple discharge, retractions, or fibrocystic changes.  Abdomen: Flat, soft with bowel sounds active. Nontender, no guarding, rebound, hernias, masses, or organomegaly.  Lymphatics: Non tender without lymphadenopathy.  Genitourinary:  Musculoskeletal: Full ROM all peripheral extremities, joint stability, 5/5 strength, and normal gait. Skin: Warm and dry without rashes, lesions, cyanosis, clubbing or  ecchymosis.  Neuro: Cranial nerves intact, reflexes equal  bilaterally. Normal muscle tone, no cerebellar symptoms. Sensation intact.  Pysch: Alert and oriented X 3, normal affect, Insight and Judgment appropriate.   Assessment and Plan  1. Annual Preventative Screening Examination  - EKG 12-Lead - Vitamin B12 - Iron and TIBC - HM DIABETES FOOT EXAM - LOW EXTREMITY NEUR EXAM DOCUM - CBC with Differential/Platelet - BASIC METABOLIC PANEL WITH GFR - Hepatic function panel - Magnesium - Lipid panel - TSH - Hemoglobin A1c - VITAMIN D 25 Hydroxy   2. Essential hypertension  - EKG 12-Lead - TSH  3. Hyperlipidemia  - Lipid panel - TSH  4. Insulin Dependent T2_DM  (HCC)  - HM DIABETES FOOT EXAM - LOW EXTREMITY NEUR EXAM DOCUM - Hemoglobin A1c  5. Vitamin D deficiency  - VITAMIN D 25 Hydroxy   6. Screening for rectal cancer   7. Other fatigue  - Vitamin B12 - Iron and TIBC - CBC with Differential/Platelet  8. Screening for ischemic heart disease   9. Medication management  - CBC with Differential/Platelet - BASIC METABOLIC PANEL WITH GFR - Hepatic function panel - Magnesium      Continue prudent diet as discussed, weight control,  BP monitoring, regular exercise, and medications. Discussed med's effects and SE's. Screening labs and tests as requested with regular follow-up as recommended. Over 40 minutes of exam, counseling, chart review and high complex critical decision making was performed.

## 2016-08-09 LAB — IRON AND TIBC
%SAT: 26 % (ref 11–50)
Iron: 74 ug/dL (ref 45–160)
TIBC: 280 ug/dL (ref 250–450)
UIBC: 206 ug/dL (ref 125–400)

## 2016-08-09 LAB — LIPID PANEL
Cholesterol: 145 mg/dL (ref 125–200)
HDL: 53 mg/dL (ref >=46)
LDL Cholesterol: 60 mg/dL (ref <130)
Total CHOL/HDL Ratio: 2.7 Ratio (ref <=5.0)
Triglycerides: 160 mg/dL — ABNORMAL HIGH (ref <150)
VLDL: 32 mg/dL — ABNORMAL HIGH (ref <30)

## 2016-08-09 LAB — HEPATIC FUNCTION PANEL
ALT: 17 U/L (ref 6–29)
AST: 17 U/L (ref 10–35)
Albumin: 4.4 g/dL (ref 3.6–5.1)
Alkaline Phosphatase: 60 U/L (ref 33–130)
Bilirubin, Direct: 0.1 mg/dL (ref <=0.2)
Indirect Bilirubin: 0.3 mg/dL (ref 0.2–1.2)
Total Bilirubin: 0.4 mg/dL (ref 0.2–1.2)
Total Protein: 8.4 g/dL — ABNORMAL HIGH (ref 6.1–8.1)

## 2016-08-09 LAB — VITAMIN D 25 HYDROXY (VIT D DEFICIENCY, FRACTURES): Vit D, 25-Hydroxy: 37 ng/mL (ref 30–100)

## 2016-08-09 LAB — VITAMIN B12: Vitamin B-12: 945 pg/mL (ref 200–1100)

## 2016-08-09 LAB — MAGNESIUM: Magnesium: 2.3 mg/dL (ref 1.5–2.5)

## 2016-08-09 LAB — BASIC METABOLIC PANEL WITH GFR
BUN: 33 mg/dL — ABNORMAL HIGH (ref 7–25)
CO2: 31 mmol/L (ref 20–31)
Calcium: 9.3 mg/dL (ref 8.6–10.4)
Chloride: 94 mmol/L — ABNORMAL LOW (ref 98–110)
Creat: 6.99 mg/dL — ABNORMAL HIGH (ref 0.50–0.99)
GFR, Est African American: 7 mL/min — ABNORMAL LOW (ref >=60)
GFR, Est Non African American: 6 mL/min — ABNORMAL LOW (ref >=60)
Glucose, Bld: 125 mg/dL — ABNORMAL HIGH (ref 65–99)
Potassium: 3.7 mmol/L (ref 3.5–5.3)
Sodium: 139 mmol/L (ref 135–146)

## 2016-08-09 LAB — TSH: TSH: 2.41 mIU/L

## 2016-08-09 LAB — HEMOGLOBIN A1C
Hgb A1c MFr Bld: 5.9 % — ABNORMAL HIGH (ref <5.7)
Mean Plasma Glucose: 123 mg/dL

## 2016-08-12 ENCOUNTER — Encounter: Payer: Self-pay | Admitting: Internal Medicine

## 2016-08-17 ENCOUNTER — Other Ambulatory Visit: Payer: Self-pay

## 2016-08-17 ENCOUNTER — Ambulatory Visit (HOSPITAL_COMMUNITY): Payer: BLUE CROSS/BLUE SHIELD | Attending: Internal Medicine

## 2016-08-17 DIAGNOSIS — Z9889 Other specified postprocedural states: Secondary | ICD-10-CM

## 2016-08-17 DIAGNOSIS — I10 Essential (primary) hypertension: Secondary | ICD-10-CM | POA: Diagnosis not present

## 2016-10-02 ENCOUNTER — Other Ambulatory Visit: Payer: Self-pay | Admitting: *Deleted

## 2016-10-02 MED ORDER — ROSUVASTATIN CALCIUM 20 MG PO TABS: 20.0000 mg | ORAL_TABLET | Freq: Every day | ORAL | 0 refills | Status: DC

## 2016-10-02 MED ORDER — BUSPIRONE HCL 10 MG PO TABS: 10.0000 mg | ORAL_TABLET | Freq: Two times a day (BID) | ORAL | 0 refills | Status: DC

## 2016-10-02 MED ORDER — LOSARTAN POTASSIUM 50 MG PO TABS: 50.0000 mg | ORAL_TABLET | Freq: Every day | ORAL | 0 refills | Status: DC

## 2016-10-02 MED ORDER — SERTRALINE HCL 25 MG PO TABS: 25.0000 mg | ORAL_TABLET | Freq: Every day | ORAL | 0 refills | Status: DC

## 2016-10-02 MED ORDER — CARVEDILOL 6.25 MG PO TABS: 6.2500 mg | ORAL_TABLET | Freq: Two times a day (BID) | ORAL | 0 refills | Status: DC

## 2016-10-23 ENCOUNTER — Ambulatory Visit (INDEPENDENT_AMBULATORY_CARE_PROVIDER_SITE_OTHER): Payer: BLUE CROSS/BLUE SHIELD | Admitting: Physician Assistant

## 2016-10-23 VITALS — BP 138/84 | HR 92 | Temp 97.3°F | Resp 16 | Ht 60.0 in | Wt 195.8 lb

## 2016-10-23 DIAGNOSIS — N186 End stage renal disease: Secondary | ICD-10-CM

## 2016-10-23 DIAGNOSIS — Z992 Dependence on renal dialysis: Secondary | ICD-10-CM

## 2016-10-23 MED ORDER — AZITHROMYCIN 250 MG PO TABS: ORAL_TABLET | ORAL | 1 refills | Status: AC

## 2016-10-23 NOTE — Progress Notes (Signed)
Subjective:    Patient ID: Jody Nguyen, female    DOB: 06-Jun-1956, 60 y.o.   MRN: [id number]  HPI 60 y.o. AAF with history of pulmonary HTN, s/p MVR, DHF, Afib, ESRD on dialysis had dialysis today presents with cough x 5-6 days, worse last 2 nights. She is at dry weight. She states that she has been coughing with mucus, has yellow mucus. Denies fever or chills. No wheezing but has unchanged shortness of breath.   BMI is Body mass index is 38.24 kg/m., she is working on diet and exercise. Wt Readings from Last 3 Encounters:  10/23/16 195 lb 12.8 oz (88.8 kg)  08/08/16 196 lb 6.4 oz (89.1 kg)  08/03/16 194 lb (88 kg)   Blood pressure 138/84, pulse 92, temperature 97.3 F (36.3 C), resp. rate 16, height 5' (1.524 m), weight 195 lb 12.8 oz (88.8 kg).  Medications Current Outpatient Prescriptions on File Prior to Visit  Medication Sig  . aspirin EC 81 MG tablet Take 81 mg by mouth daily.  . busPIRone (BUSPAR) 10 MG tablet Take 1 tablet (10 mg total) by mouth 2 (two) times daily.  . carvedilol (COREG) 6.25 MG tablet Take 1 tablet (6.25 mg total) by mouth 2 (two) times daily.  . insulin aspart protamine - aspart (NOVOLOG MIX 70/30 FLEXPEN) (70-30) 100 UNIT/ML FlexPen Inject 0.1 mLs (10 Units total) into the skin 2 (two) times daily. (Patient taking differently: Inject 4 Units into the skin 2 (two) times daily as needed (If sugar goes above 180). )  . losartan (COZAAR) 50 MG tablet Take 1 tablet (50 mg total) by mouth daily.  Marland Kitchen RENVELA 800 MG tablet Take 800-2,400 mg by mouth 3 (three) times daily. 2400 mg (3 caps) with meals and 800 mg (1 cap) with snacks  . rosuvastatin (CRESTOR) 20 MG tablet Take 1 tablet (20 mg total) by mouth daily.  . SENSIPAR 30 MG tablet   . sertraline (ZOLOFT) 25 MG tablet Take 1 tablet (25 mg total) by mouth daily.   No current facility-administered medications on file prior to visit.     Problem list She has Hypertension; Hyperlipidemia; Anemia in chronic  kidney disease; Vitamin D deficiency; Medication management; Insulin dependent diabetes with renal manifestation ([place]); Morbid obesity (BMI 36.05) ; Pericardial effusion; Insulin Dependent T2_DM  ([place]); Pulmonary hypertension; Chronic diastolic heart failure ([place]); Chronic periodontitis; Respiratory failure, acute hypoxic, post-operative; S/P minimally invasive mitral valve repair; Cardiomyopathy ([place]); Atrial fibrillation ([place]); Long term (current) use of anticoagulants; BMI 31.0-31.9,adult; ESRD on dialysis ([place]); and End stage renal disease ([place]) on her problem list.  Review of Systems  Constitutional: Negative for chills and diaphoresis.  HENT: Positive for congestion, postnasal drip, sinus pressure and sneezing. Negative for ear pain and sore throat.   Respiratory: Positive for cough. Negative for chest tightness, shortness of breath and wheezing.   Cardiovascular: Negative.   Gastrointestinal: Negative.   Genitourinary: Negative.   Musculoskeletal: Negative for neck pain.  Neurological: Positive for headaches.       Objective:   Physical Exam  Constitutional: She is oriented to person, place, and time. She appears well-developed and well-nourished. No distress.  HENT:  Head: Normocephalic and atraumatic.  Right Ear: External ear normal.  Nose: Right sinus exhibits maxillary sinus tenderness. Right sinus exhibits no frontal sinus tenderness. Left sinus exhibits maxillary sinus tenderness. Left sinus exhibits no frontal sinus tenderness.  Mouth/Throat: Oropharynx is clear and moist. No oropharyngeal exudate.  Eyes: Conjunctivae and EOM are normal.  No scleral icterus.  Neck: Normal range of motion. Neck supple. No JVD present. No thyromegaly present.  Cardiovascular: Normal rate, regular rhythm and intact distal pulses.  Exam reveals no gallop and no friction rub.   Murmur heard. No peripheral edema. Graft present on left arm with bandage over it. Clean and dry without drainage.    Pulmonary/Chest: Effort normal and breath sounds normal. No respiratory distress. She has no wheezes. She has no rales. She exhibits no tenderness.  Abdominal: Soft. Bowel sounds are normal. She exhibits no distension and no mass. There is no tenderness. There is no rebound and no guarding.  Musculoskeletal: Normal range of motion.  Lymphadenopathy:    She has no cervical adenopathy.  Neurological: She is alert and oriented to person, place, and time.  Skin: Skin is warm and dry. She is not diaphoretic.  Psychiatric: She has a normal mood and affect. Her behavior is normal. Judgment and thought content normal.  Nursing note and vitals reviewed.      Assessment & Plan:  1. ESRD on dialysis with sinusitis claritin every other day, zpak, delsym, if worse or not better call the office At dry weight, lungs CTAB

## 2016-10-23 NOTE — Patient Instructions (Addendum)
Claritin 3 days a week Zpak can take if not better Can do flonase over the counter  Sinusitis can be uncomfortable. People with sinusitis have congestion with yellow/green/gray discharge, sinus pain/pressure, pain around the eyes. Sinus infections almost ALWAYS stem from a viral infection and antibiotics don't work against a virus. Even when bacteria is responsible, the infections usually clear up on their own in a week or so.    -If you do not get better in 7-10 days (Have fever, facial pain, dental pain and swelling), then please call the office and it is now appropriate to start an antibiotic.   -Please take Tylenol or Ibuprofen for pain. -Acetaminiphen 325mg  orally every 4-6 hours for pain.  Max: 10 per day -Ibuprofen 200mg  orally every 6-8 hours for pain.  Take with food to avoid ulcers.   Max 10 per day  Please pick one of the over the counter allergy medications below and take it once daily for allergies.  Claritin or loratadine cheapest but likely the weakest  Zyrtec or certizine at night because it can make you sleepy The strongest is allegra or fexafinadine  Cheapest at [employer], [employer], [employer]  -While drinking fluids, pinch and hold nose close and swallow.  This will help open up your eustachian tubes to drain the fluid behind your ear drums. -Try steam showers to open your nasal passages.   Drink lots of water to stay hydrated and to thin mucous.  Flonase/Nasonex is to help the inflammation.  Take 2 sprays in each nostril at bedtime.  Make sure you spray towards the outside of each nostril towards the outer corner of your eye, hold nose close and tilt head back.  This will help the medication get into your sinuses.  If you do not like this medication, then use saline nasal sprays same directions as above for Flonase. Stop the medication right away if you get blurring of your vision or nose bleeds.  Sinusitis Sinusitis is redness, soreness, and inflammation of the paranasal  sinuses. Paranasal sinuses are air pockets within the bones of your face (beneath the eyes, the middle of the forehead, or above the eyes). In healthy paranasal sinuses, mucus is able to drain out, and air is able to circulate through them by way of your nose. However, when your paranasal sinuses are inflamed, mucus and air can become trapped. This can allow bacteria and other germs to grow and cause infection. Sinusitis can develop quickly and last only a short time (acute) or continue over a long period (chronic). Sinusitis that lasts for more than 12 weeks is considered chronic.  CAUSES  Causes of sinusitis include: Allergies. Structural abnormalities, such as displacement of the cartilage that separates your nostrils (deviated septum), which can decrease the air flow through your nose and sinuses and affect sinus drainage. Functional abnormalities, such as when the small hairs (cilia) that line your sinuses and help remove mucus do not work properly or are not present. SIGNS AND SYMPTOMS  Symptoms of acute and chronic sinusitis are the same. The primary symptoms are pain and pressure around the affected sinuses. Other symptoms include: Upper toothache. Earache. Headache. Bad breath. Decreased sense of smell and taste. A cough, which worsens when you are lying flat. Fatigue. Fever. Thick drainage from your nose, which often is green and may contain pus (purulent). Swelling and warmth over the affected sinuses. DIAGNOSIS  Your health care provider will perform a physical exam. During the exam, your health care provider may: Look in  your nose for signs of abnormal growths in your nostrils (nasal polyps).  Tap over the affected sinus to check for signs of infection. View the inside of your sinuses (endoscopy) using an imaging device that has a light attached (endoscope). If your health care provider suspects that you have chronic sinusitis, one or more of the following tests may be  recommended: Allergy tests. Nasal culture. A sample of mucus is taken from your nose, sent to a lab, and screened for bacteria. Nasal cytology. A sample of mucus is taken from your nose and examined by your health care provider to determine if your sinusitis is related to an allergy. TREATMENT  Most cases of acute sinusitis are related to a viral infection and will resolve on their own within 10 days. Sometimes medicines are prescribed to help relieve symptoms (pain medicine, decongestants, nasal steroid sprays, or saline sprays).  However, for sinusitis related to a bacterial infection, your health care provider will prescribe antibiotic medicines. These are medicines that will help kill the bacteria causing the infection.  Rarely, sinusitis is caused by a fungal infection. In theses cases, your health care provider will prescribe antifungal medicine. For some cases of chronic sinusitis, surgery is needed. Generally, these are cases in which sinusitis recurs more than 3 times per year, despite other treatments. HOME CARE INSTRUCTIONS  Drink plenty of water. Water helps thin the mucus so your sinuses can drain more easily. Use a humidifier. Inhale steam 3 to 4 times a day (for example, sit in the bathroom with the shower running). Apply a warm, moist washcloth to your face 3 to 4 times a day, or as directed by your health care provider. Use saline nasal sprays to help moisten and clean your sinuses. Take medicines only as directed by your health care provider. If you were prescribed either an antibiotic or antifungal medicine, finish it all even if you start to feel better. SEEK IMMEDIATE MEDICAL CARE IF: You have increasing pain or severe headaches. You have nausea, vomiting, or drowsiness. You have swelling around your face. You have vision problems. You have a stiff neck. You have difficulty breathing. MAKE SURE YOU:  Understand these instructions. Will watch your condition. Will get  help right away if you are not doing well or get worse. Document Released: 11/13/2005 Document Revised: 03/30/2014 Document Reviewed: 11/28/2011 [facility] Patient Information 2015 [place], [place]. This information is not intended to replace advice given to you by your health care provider. Make sure you discuss any questions you have with your health care provider.

## 2016-11-09 ENCOUNTER — Ambulatory Visit (INDEPENDENT_AMBULATORY_CARE_PROVIDER_SITE_OTHER): Payer: BLUE CROSS/BLUE SHIELD | Admitting: Internal Medicine

## 2016-11-09 ENCOUNTER — Encounter: Payer: Self-pay | Admitting: Internal Medicine

## 2016-11-09 VITALS — BP 118/76 | HR 88 | Temp 98.0°F | Resp 16 | Ht 60.0 in | Wt 194.0 lb

## 2016-11-09 DIAGNOSIS — IMO0001 Reserved for inherently not codable concepts without codable children: Secondary | ICD-10-CM

## 2016-11-09 DIAGNOSIS — I480 Paroxysmal atrial fibrillation: Secondary | ICD-10-CM | POA: Diagnosis not present

## 2016-11-09 DIAGNOSIS — M79642 Pain in left hand: Secondary | ICD-10-CM | POA: Diagnosis not present

## 2016-11-09 DIAGNOSIS — Z794 Long term (current) use of insulin: Secondary | ICD-10-CM

## 2016-11-09 DIAGNOSIS — I5032 Chronic diastolic (congestive) heart failure: Secondary | ICD-10-CM

## 2016-11-09 DIAGNOSIS — E782 Mixed hyperlipidemia: Secondary | ICD-10-CM | POA: Diagnosis not present

## 2016-11-09 DIAGNOSIS — Z79899 Other long term (current) drug therapy: Secondary | ICD-10-CM

## 2016-11-09 DIAGNOSIS — E1129 Type 2 diabetes mellitus with other diabetic kidney complication: Secondary | ICD-10-CM | POA: Diagnosis not present

## 2016-11-09 DIAGNOSIS — Z992 Dependence on renal dialysis: Secondary | ICD-10-CM | POA: Diagnosis not present

## 2016-11-09 DIAGNOSIS — E559 Vitamin D deficiency, unspecified: Secondary | ICD-10-CM | POA: Diagnosis not present

## 2016-11-09 DIAGNOSIS — E119 Type 2 diabetes mellitus without complications: Secondary | ICD-10-CM | POA: Diagnosis not present

## 2016-11-09 DIAGNOSIS — N186 End stage renal disease: Secondary | ICD-10-CM | POA: Diagnosis not present

## 2016-11-09 LAB — BASIC METABOLIC PANEL WITH GFR
BUN: 32 mg/dL — ABNORMAL HIGH (ref 7–25)
CO2: 32 mmol/L — ABNORMAL HIGH (ref 20–31)
Calcium: 9.3 mg/dL (ref 8.6–10.4)
Chloride: 93 mmol/L — ABNORMAL LOW (ref 98–110)
Creat: 6.05 mg/dL — ABNORMAL HIGH (ref 0.50–0.99)
GFR, Est African American: 8 mL/min — ABNORMAL LOW (ref >=60)
GFR, Est Non African American: 7 mL/min — ABNORMAL LOW (ref >=60)
Glucose, Bld: 169 mg/dL — ABNORMAL HIGH (ref 65–99)
Potassium: 3.5 mmol/L (ref 3.5–5.3)
Sodium: 137 mmol/L (ref 135–146)

## 2016-11-09 LAB — URIC ACID: Uric Acid, Serum: 4.6 mg/dL (ref 2.5–7.0)

## 2016-11-09 LAB — CBC WITH DIFFERENTIAL/PLATELET
Basophils Absolute: 64 cells/uL (ref 0–200)
Basophils Relative: 1 %
Eosinophils Absolute: 256 cells/uL (ref 15–500)
Eosinophils Relative: 4 %
HCT: 37.2 % (ref 35.0–45.0)
Hemoglobin: 11.7 g/dL (ref 11.7–15.5)
Lymphocytes Relative: 36 %
Lymphs Abs: 2304 cells/uL (ref 850–3900)
MCH: 28.3 pg (ref 27.0–33.0)
MCHC: 31.5 g/dL — ABNORMAL LOW (ref 32.0–36.0)
MCV: 89.9 fL (ref 80.0–100.0)
MPV: 10.7 fL (ref 7.5–12.5)
Monocytes Absolute: 704 cells/uL (ref 200–950)
Monocytes Relative: 11 %
Neutro Abs: 3072 cells/uL (ref 1500–7800)
Neutrophils Relative %: 48 %
Platelets: 328 10*3/uL (ref 140–400)
RBC: 4.14 MIL/uL (ref 3.80–5.10)
RDW: 19.5 % — ABNORMAL HIGH (ref 11.0–15.0)
WBC: 6.4 10*3/uL (ref 3.8–10.8)

## 2016-11-09 LAB — HEPATIC FUNCTION PANEL
ALT: 16 U/L (ref 6–29)
AST: 14 U/L (ref 10–35)
Albumin: 4.1 g/dL (ref 3.6–5.1)
Alkaline Phosphatase: 109 U/L (ref 33–130)
Bilirubin, Direct: 0.1 mg/dL (ref <=0.2)
Indirect Bilirubin: 0.4 mg/dL (ref 0.2–1.2)
Total Bilirubin: 0.5 mg/dL (ref 0.2–1.2)
Total Protein: 8 g/dL (ref 6.1–8.1)

## 2016-11-09 LAB — HEMOGLOBIN A1C
Hgb A1c MFr Bld: 6.5 % — ABNORMAL HIGH (ref <5.7)
Mean Plasma Glucose: 140 mg/dL

## 2016-11-09 LAB — LIPID PANEL
Cholesterol: 155 mg/dL (ref <200)
HDL: 56 mg/dL (ref >50)
LDL Cholesterol: 59 mg/dL (ref <100)
Total CHOL/HDL Ratio: 2.8 Ratio (ref <5.0)
Triglycerides: 202 mg/dL — ABNORMAL HIGH (ref <150)
VLDL: 40 mg/dL — ABNORMAL HIGH (ref <30)

## 2016-11-09 MED ORDER — PREDNISONE 20 MG PO TABS
ORAL_TABLET | ORAL | 0 refills | Status: DC
Start: 2016-11-09 — End: 2017-03-05

## 2016-11-09 NOTE — Progress Notes (Signed)
Assessment and Plan:  Hypertension:  -Continue medication -monitor blood pressure at home. -Continue DASH diet -Reminder to go to the ER if any CP, SOB, nausea, dizziness, severe HA, changes vision/speech, left arm numbness and tingling and jaw pain.  Cholesterol - Continue diet and exercise -Check cholesterol.   Diabetes with diabetic chronic kidney disease, and ESRD -cont current meds -patient reports that she is having good control of BS -Continue diet and exercise.  -Check A1C  Vitamin D Def -check level -continue medications.  ESRD -cont dialysis -graft was recently cleaned out  Left Hand pain -possible venous congestion causing swelling of hand -hand xray -prednisone -will have to see if we can use a sleeve for the arm to help with venous congestion  CHF with AFib -cont meds -cont anticoagulation   Continue diet and meds as discussed. Further disposition pending results of labs. Discussed med's effects and SE's.    HPI 60 y.o. female  presents for 3 month follow up with hypertension, hyperlipidemia, diabetes and vitamin D deficiency.   Her blood pressure has been controlled at home, today their BP is BP: 118/76.She does not workout. She denies chest pain, shortness of breath, dizziness.   She is on cholesterol medication and denies myalgias. Her cholesterol is at goal. The cholesterol was:  08/08/2016: Cholesterol 145; HDL 53; LDL Cholesterol 60; Triglycerides 160   She has been working on diet and exercise for diabetes with diabetic chronic kidney disease, she is on bASA, she is not on ACE/ARB, and denies  foot ulcerations, hyperglycemia, hypoglycemia , increased appetite, nausea, paresthesia of the feet, polydipsia, polyuria, visual disturbances, vomiting and weight loss. Last A1C was: 08/08/2016: Hgb A1c MFr Bld 5.9   Patient is on Vitamin D supplement. 08/08/2016: Vit D, 25-Hydroxy 37  She reports that she has been on dialysis and they have finally  straightened out her blood pressures.  She reports that she has not heard anything on her transplant list.    Current Medications:  Current Outpatient Prescriptions on File Prior to Visit  Medication Sig Dispense Refill  . aspirin EC 81 MG tablet Take 81 mg by mouth daily.    . busPIRone (BUSPAR) 10 MG tablet Take 1 tablet (10 mg total) by mouth 2 (two) times daily. 180 tablet 0  . carvedilol (COREG) 6.25 MG tablet Take 1 tablet (6.25 mg total) by mouth 2 (two) times daily. 180 tablet 0  . insulin aspart protamine - aspart (NOVOLOG MIX 70/30 FLEXPEN) (70-30) 100 UNIT/ML FlexPen Inject 0.1 mLs (10 Units total) into the skin 2 (two) times daily. (Patient taking differently: Inject 4 Units into the skin 2 (two) times daily as needed (If sugar goes above 180). ) 15 mL 11  . losartan (COZAAR) 50 MG tablet Take 1 tablet (50 mg total) by mouth daily. 90 tablet 0  . RENVELA 800 MG tablet Take 800-2,400 mg by mouth 3 (three) times daily. 2400 mg (3 caps) with meals and 800 mg (1 cap) with snacks  5  . rosuvastatin (CRESTOR) 20 MG tablet Take 1 tablet (20 mg total) by mouth daily. 90 tablet 0  . SENSIPAR 30 MG tablet   3  . sertraline (ZOLOFT) 25 MG tablet Take 1 tablet (25 mg total) by mouth daily. 90 tablet 0   No current facility-administered medications on file prior to visit.    Medical History:  Past Medical History:  Diagnosis Date  . Anemia   . Chronic diastolic CHF (congestive heart failure) ([place])  takes Furosemide daily  . Chronic diastolic heart failure ([place])   . CKD (chronic kidney disease) stage 4, GFR 15-29 ml/min (HCC)   . Constipation   . CVA (cerebral infarction) 1997   no residual deficit  . Diverticulitis   . DM (diabetes mellitus) ([place])    takes Novolog and Metformin daily  . Heart murmur   . History of cardiovascular stress test    a. Myoview Oct 2012 showed EF 49%, no ischemia, LVE  . HLD (hyperlipidemia)    takes Crestor daily  . Hypertension    takes  Hydralazine,Losartan,and Labetalol daily  . Pericardial effusion    chronic; felt to be poss related to minoxidil >> DC'd  . Pulmonary hypertension 06/18/2015  . Respiratory failure, acute hypoxic, post-operative 07/08/2015   Requiring ECMO support  . S/P cardiac catheterization    a. R/L HC 06/18/15:  mLAD 30%; severe pulmo HTN with PA sat 43%, CI 1.86, prominent V waves indicative of MR; resting hypoxemia O2 sat 86% on RA  . S/P minimally invasive mitral valve repair 07/08/2015   Complex valvuloplasty including artificial Gore-tex neochord placement x10 and 26 mm Sorin Memo 3D Rechord ring annuloplasty via right mini thoracotomy approach  . Severe mitral regurgitation   . Shortness of breath dyspnea   . Stroke ([place]) 1997   no residual effect  . Vitamin D deficiency   . Wears glasses    Allergies:  Allergies  Allergen Reactions  . Minoxidil Other (See Comments)    Pericardial effusion  . Lipitor [Atorvastatin] Other (See Comments)    REACTION: "pain in legs" Tolerates rosuvastatin  . Ace Inhibitors Other (See Comments)    REACTION: "not sure...think it made me drowsy all the time"     Review of Systems:  Review of Systems  Constitutional: Negative for chills, fever and malaise/fatigue.  HENT: Negative for congestion, ear pain and sore throat.   Eyes: Negative.   Respiratory: Negative for cough, shortness of breath and wheezing.   Cardiovascular: Negative for chest pain, palpitations and leg swelling.  Gastrointestinal: Negative for abdominal pain, blood in stool, constipation, diarrhea, heartburn and melena.  Genitourinary: Negative.   Musculoskeletal: Positive for joint pain.  Skin: Negative.   Neurological: Negative for dizziness, sensory change, loss of consciousness and headaches.  Psychiatric/Behavioral: Negative for depression. The patient is not nervous/anxious and does not have insomnia.     Family history- Review and unchanged  Social history- Review and  unchanged  Physical Exam: BP 118/76   Pulse 88   Temp 98 F (36.7 C) (Temporal)   Resp 16   Ht 5' (1.524 m)   Wt 194 lb (88 kg)   BMI 37.89 kg/m  Wt Readings from Last 3 Encounters:  11/09/16 194 lb (88 kg)  10/23/16 195 lb 12.8 oz (88.8 kg)  08/08/16 196 lb 6.4 oz (89.1 kg)   General Appearance: Well nourished well developed, non-toxic appearing, in no apparent distress. Eyes: PERRLA, EOMs, conjunctiva no swelling or erythema ENT/Mouth: Ear canals clear with no erythema, swelling, or discharge.  TMs normal bilaterally, oropharynx clear, moist, with no exudate.   Neck: Supple, thyroid normal, no JVD, no cervical adenopathy.  Respiratory: Respiratory effort normal, breath sounds clear A&P, no wheeze, rhonchi or rales noted.  No retractions, no accessory muscle usage Cardio: RRR with no MRGs. No noted edema.  Abdomen: Soft, + BS.  Non tender, no guarding, rebound, hernias, masses. Musculoskeletal: Full ROM, 5/5 strength, Normal gait Skin: Warm, dry without rashes, lesions,  ecchymosis.  Neuro: Awake and oriented X 3, Cranial nerves intact. No cerebellar symptoms.  Psych: normal affect, Insight and Judgment appropriate.    Starlyn Skeans, PA-C 9:30 AM [facility]

## 2016-11-10 ENCOUNTER — Ambulatory Visit (HOSPITAL_COMMUNITY)
Admission: RE | Admit: 2016-11-10 | Discharge: 2016-11-10 | Disposition: A | Discharge status: Home / Self Care | Payer: BLUE CROSS/BLUE SHIELD | Source: Ambulatory Visit | Attending: Internal Medicine | Admitting: Internal Medicine

## 2016-11-10 DIAGNOSIS — M79642 Pain in left hand: Secondary | ICD-10-CM | POA: Diagnosis present

## 2016-11-23 ENCOUNTER — Ambulatory Visit: Payer: Self-pay | Admitting: Internal Medicine

## 2016-12-01 ENCOUNTER — Other Ambulatory Visit: Payer: Self-pay | Admitting: *Deleted

## 2016-12-01 DIAGNOSIS — T82510D Breakdown (mechanical) of surgically created arteriovenous fistula, subsequent encounter: Secondary | ICD-10-CM

## 2016-12-07 ENCOUNTER — Other Ambulatory Visit (HOSPITAL_COMMUNITY)
Admission: RE | Admit: 2016-12-07 | Discharge: 2016-12-07 | Disposition: A | Discharge status: Home / Self Care | Payer: 59 | Source: Ambulatory Visit | Attending: Internal Medicine | Admitting: Internal Medicine

## 2016-12-07 ENCOUNTER — Encounter: Payer: Self-pay | Admitting: Internal Medicine

## 2016-12-07 ENCOUNTER — Ambulatory Visit (INDEPENDENT_AMBULATORY_CARE_PROVIDER_SITE_OTHER): Payer: 59 | Admitting: Internal Medicine

## 2016-12-07 VITALS — BP 134/72 | HR 100 | Temp 98.0°F | Resp 18 | Ht 60.0 in | Wt 195.0 lb

## 2016-12-07 DIAGNOSIS — Z124 Encounter for screening for malignant neoplasm of cervix: Secondary | ICD-10-CM | POA: Diagnosis not present

## 2016-12-07 DIAGNOSIS — Z01411 Encounter for gynecological examination (general) (routine) with abnormal findings: Secondary | ICD-10-CM | POA: Insufficient documentation

## 2016-12-07 DIAGNOSIS — Z1151 Encounter for screening for human papillomavirus (HPV): Secondary | ICD-10-CM | POA: Insufficient documentation

## 2016-12-07 NOTE — Progress Notes (Signed)
Assessment and Plan:   1. Screening for cervical cancer -will fax results to the social worker in charge of her transplant information - Cytology - PAP    HPI 61 y.o.female presents for a pap smear to stay complaint with her kidney transplant list.  She has no history of an abnormal pap smear.  No post menopausal bleeding.  She has no vaginal discharge.  She does not struggle with vaginal dryness.  She has no other complaints today.    Past Medical History:  Diagnosis Date  . Anemia   . Chronic diastolic CHF (congestive heart failure) (HCC)    takes Furosemide daily  . Chronic diastolic heart failure ([place])   . CKD (chronic kidney disease) stage 4, GFR 15-29 ml/min (HCC)   . Constipation   . CVA (cerebral infarction) 1997   no residual deficit  . Diverticulitis   . DM (diabetes mellitus) ([place])    takes Novolog and Metformin daily  . Heart murmur   . History of cardiovascular stress test    a. Myoview Oct 2012 showed EF 49%, no ischemia, LVE  . HLD (hyperlipidemia)    takes Crestor daily  . Hypertension    takes Hydralazine,Losartan,and Labetalol daily  . Pericardial effusion    chronic; felt to be poss related to minoxidil >> DC'd  . Pulmonary hypertension 06/18/2015  . Respiratory failure, acute hypoxic, post-operative 07/08/2015   Requiring ECMO support  . S/P cardiac catheterization    a. R/L HC 06/18/15:  mLAD 30%; severe pulmo HTN with PA sat 43%, CI 1.86, prominent V waves indicative of MR; resting hypoxemia O2 sat 86% on RA  . S/P minimally invasive mitral valve repair 07/08/2015   Complex valvuloplasty including artificial Gore-tex neochord placement x10 and 26 mm Sorin Memo 3D Rechord ring annuloplasty via right mini thoracotomy approach  . Severe mitral regurgitation   . Shortness of breath dyspnea   . Stroke ([place]) 1997   no residual effect  . Vitamin D deficiency   . Wears glasses      Allergies  Allergen Reactions  . Minoxidil Other (See Comments)   Pericardial effusion  . Lipitor [Atorvastatin] Other (See Comments)    REACTION: "pain in legs" Tolerates rosuvastatin  . Ace Inhibitors Other (See Comments)    REACTION: "not sure...think it made me drowsy all the time"      Current Outpatient Prescriptions on File Prior to Visit  Medication Sig Dispense Refill  . aspirin EC 81 MG tablet Take 81 mg by mouth daily.    . busPIRone (BUSPAR) 10 MG tablet Take 1 tablet (10 mg total) by mouth 2 (two) times daily. 180 tablet 0  . carvedilol (COREG) 6.25 MG tablet Take 1 tablet (6.25 mg total) by mouth 2 (two) times daily. 180 tablet 0  . insulin aspart protamine - aspart (NOVOLOG MIX 70/30 FLEXPEN) (70-30) 100 UNIT/ML FlexPen Inject 0.1 mLs (10 Units total) into the skin 2 (two) times daily. (Patient taking differently: Inject 4 Units into the skin 2 (two) times daily as needed (If sugar goes above 180). ) 15 mL 11  . losartan (COZAAR) 50 MG tablet Take 1 tablet (50 mg total) by mouth daily. 90 tablet 0  . predniSONE (DELTASONE) 20 MG tablet 3 tabs po daily x 3 days, then 2 tabs x 3 days, then 1.5 tabs x 3 days, then 1 tab x 3 days, then 0.5 tabs x 3 days 27 tablet 0  . RENVELA 800 MG tablet Take 800-2,400 mg  by mouth 3 (three) times daily. 2400 mg (3 caps) with meals and 800 mg (1 cap) with snacks  5  . rosuvastatin (CRESTOR) 20 MG tablet Take 1 tablet (20 mg total) by mouth daily. 90 tablet 0  . SENSIPAR 30 MG tablet   3  . sertraline (ZOLOFT) 25 MG tablet Take 1 tablet (25 mg total) by mouth daily. 90 tablet 0   No current facility-administered medications on file prior to visit.     ROS: all negative except above.   Physical Exam: Filed Weights   12/07/16 0911  Weight: 195 lb (88.5 kg)   BP 134/72   Pulse 100   Temp 98 F (36.7 C) (Temporal)   Resp 18   Ht 5' (1.524 m)   Wt 195 lb (88.5 kg)   BMI 38.08 kg/m  General Appearance: Chronically ill appearing, obese AA female, Well developed well nourished, non-toxic appearing in no  apparent distress. Eyes: PERRLA, EOMs, conjunctiva w/ no swelling or erythema or discharge Sinuses: No Frontal/maxillary tenderness ENT/Mouth: Ear canals clear without swelling or erythema.  TM's normal bilaterally with no retractions, bulging, or loss of landmarks.   Neck: Supple, thyroid normal, no notable JVD  Respiratory: Respiratory effort normal, Clear breath sounds anteriorly and posteriorly bilaterally without rales, rhonchi, wheezing or stridor. No retractions or accessory muscle usage. Cardio: RRR with no MRGs. Dialysis graft in the left forearm with palpable thrill.    Abdomen: Soft, + BS.  Non tender, no guarding, rebound, hernias, masses.  GU:  Normal female external genitalia.  Vaginal tissues pink and moist with minimal atrophic changes.  Countour of the cervix normal.  Thin clear mucous in the vaginal posterior vault.  Ovaries non-palpable bilaterally.  No CMT.  No palpable fibroids.   Musculoskeletal: Full ROM, 5/5 strength, normal gait.  Skin: Warm, dry without rashes  Neuro: Awake and oriented X 3, Cranial nerves intact. Normal muscle tone, no cerebellar symptoms. Sensation intact.  Psych: normal affect, Insight and Judgment appropriate.     Starlyn Skeans, PA-C 9:40 AM [facility]

## 2016-12-12 LAB — CYTOLOGY - PAP
Diagnosis: NEGATIVE
HPV: NOT DETECTED

## 2016-12-14 ENCOUNTER — Encounter: Payer: Self-pay | Admitting: Surgery

## 2016-12-19 ENCOUNTER — Ambulatory Visit (HOSPITAL_COMMUNITY)
Admission: RE | Admit: 2016-12-19 | Discharge: 2016-12-19 | Disposition: A | Discharge status: Home / Self Care | Payer: 59 | Source: Ambulatory Visit | Attending: Family | Admitting: Family

## 2016-12-19 ENCOUNTER — Ambulatory Visit (INDEPENDENT_AMBULATORY_CARE_PROVIDER_SITE_OTHER): Payer: 59 | Admitting: Physician Assistant

## 2016-12-19 VITALS — BP 96/73 | HR 86 | Temp 98.4°F | Resp 16 | Ht 60.0 in | Wt 194.0 lb

## 2016-12-19 DIAGNOSIS — N186 End stage renal disease: Secondary | ICD-10-CM

## 2016-12-19 DIAGNOSIS — T82510D Breakdown (mechanical) of surgically created arteriovenous fistula, subsequent encounter: Secondary | ICD-10-CM | POA: Insufficient documentation

## 2016-12-19 DIAGNOSIS — Z992 Dependence on renal dialysis: Secondary | ICD-10-CM | POA: Diagnosis not present

## 2016-12-19 DIAGNOSIS — Y832 Surgical operation with anastomosis, bypass or graft as the cause of abnormal reaction of the patient, or of later complication, without mention of misadventure at the time of the procedure: Secondary | ICD-10-CM | POA: Insufficient documentation

## 2016-12-19 DIAGNOSIS — M7989 Other specified soft tissue disorders: Secondary | ICD-10-CM

## 2016-12-19 NOTE — Progress Notes (Signed)
HISTORY AND PHYSICAL     CC:  Left arm swelling Referring Provider:  Unk Pinto, MD  HPI: [name] who is ESRD and dialyzes M/W/F.  She presents today for left arm swelling.  She states that the swelling over her arm has improved but she still has pain over the fistula after dialysis and at night.  She uses heat and this does give her some relief.   She states that they do not have any trouble with the fistula at dialysis.  She denies any hand pain. She has had swelling of the arm in the past and underwent a fistulogram.  She was found to have a short segment occlusion of the left subclavian vein and b/c of the tortuosity of the vessel, he did not think she was a candidate for an attempt at recanalization.    She takes a daily aspirin.  She is on a beta blocker and ARB for blood pressure control.  She is on insulin for diabetes.  She is on a statin for cholesterol management.   Past Medical History:  Diagnosis Date  . Anemia   . Chronic diastolic CHF (congestive heart failure) (HCC)    takes Furosemide daily  . Chronic diastolic heart failure ([place])   . CKD (chronic kidney disease) stage 4, GFR 15-29 ml/min (HCC)   . Constipation   . CVA (cerebral infarction) 1997   no residual deficit  . Diverticulitis   . DM (diabetes mellitus) ([place])    takes Novolog and Metformin daily  . Heart murmur   . History of cardiovascular stress test    a. Myoview Oct 2012 showed EF 49%, no ischemia, LVE  . HLD (hyperlipidemia)    takes Crestor daily  . Hypertension    takes Hydralazine,Losartan,and Labetalol daily  . Pericardial effusion    chronic; felt to be poss related to minoxidil >> DC'd  . Pulmonary hypertension 06/18/2015  . Respiratory failure, acute hypoxic, post-operative 07/08/2015   Requiring ECMO support  . S/P cardiac catheterization    a. R/L HC 06/18/15:  mLAD 30%; severe pulmo HTN with PA sat 43%, CI 1.86, prominent V waves indicative of MR; resting hypoxemia O2  sat 86% on RA  . S/P minimally invasive mitral valve repair 07/08/2015   Complex valvuloplasty including artificial Gore-tex neochord placement x10 and 26 mm Sorin Memo 3D Rechord ring annuloplasty via right mini thoracotomy approach  . Severe mitral regurgitation   . Shortness of breath dyspnea   . Stroke ([place]) 1997   no residual effect  . Vitamin D deficiency   . Wears glasses     Past Surgical History:  Procedure Laterality Date  . AV FISTULA PLACEMENT Left 10/22/2015   Procedure: RADIOCEPHALIC ARTERIOVENOUS (AV) FISTULA CREATION;  Surgeon: Serafina Mitchell, MD;  Location: [place];  Service: Vascular;  Laterality: Left;  . CANNULATION FOR CARDIOPULMONARY BYPASS N/A 07/08/2015   Procedure: CANNULATION FOR ECMO;  Surgeon: Rexene Alberts, MD;  Location: [place];  Service: Open Heart Surgery;  Laterality: N/A;  . CARDIAC CATHETERIZATION    . CARDIAC CATHETERIZATION N/A 06/18/2015   Procedure: Right/Left Heart Cath and Coronary Angiography;  Surgeon: Jettie Booze, MD;  Location: [place] [facility];  Service: Cardiovascular;  Laterality: N/A;  . CESAREAN SECTION     x 2  . COLONOSCOPY    . FISTULA SUPERFICIALIZATION Left 12/28/2015   Procedure: SUPERFICIALIZATION LEFT RADIOCEPHALIC FISTULA;  Surgeon: Serafina Mitchell, MD;  Location: [place];  Service: Vascular;  Laterality: Left;  . MITRAL VALVE REPAIR Right 07/08/2015   Procedure: MINIMALLY INVASIVE MITRAL VALVE REPAIR with a 26 Sorin Memo 3D Rechord;  Surgeon: Rexene Alberts, MD;  Location: [place];  Service: Open Heart Surgery;  Laterality: Right;  . MULTIPLE EXTRACTIONS WITH ALVEOLOPLASTY N/A 06/30/2015   Procedure: MULTIPLE EXTRACTIONS OF TOOTH #'S 4  AND 30 WITH ALVEOLOPLASTY AND GROSS DEBRIDEMENT  OF REMAINING TEETH;  Surgeon: Lenn Cal, DDS;  Location: [place];  Service: Oral Surgery;  Laterality: N/A;  . PERIPHERAL VASCULAR CATHETERIZATION Left 03/28/2016   Procedure: A/V Fistulagram;  Surgeon: Serafina Mitchell, MD;  Location: [place] [facility];  Service: Cardiovascular;  Laterality: Left;  lower arm  . TEE WITHOUT CARDIOVERSION N/A 06/08/2015   Procedure: TRANSESOPHAGEAL ECHOCARDIOGRAM (TEE);  Surgeon: Lelon Perla, MD;  Location: [facility];  Service: Cardiovascular;  Laterality: N/A;  . TEE WITHOUT CARDIOVERSION N/A 07/08/2015   Procedure: TRANSESOPHAGEAL ECHOCARDIOGRAM (TEE);  Surgeon: Rexene Alberts, MD;  Location: [place];  Service: Open Heart Surgery;  Laterality: N/A;    Allergies  Allergen Reactions  . Minoxidil Other (See Comments)    Pericardial effusion  . Lipitor [Atorvastatin] Other (See Comments)    REACTION: "pain in legs" Tolerates rosuvastatin  . Ace Inhibitors Other (See Comments)    REACTION: "not sure...think it made me drowsy all the time"    Current Outpatient Prescriptions  Medication Sig Dispense Refill  . aspirin EC 81 MG tablet Take 81 mg by mouth daily.    . busPIRone (BUSPAR) 10 MG tablet Take 1 tablet (10 mg total) by mouth 2 (two) times daily. 180 tablet 0  . carvedilol (COREG) 6.25 MG tablet Take 1 tablet (6.25 mg total) by mouth 2 (two) times daily. 180 tablet 0  . insulin aspart protamine - aspart (NOVOLOG MIX 70/30 FLEXPEN) (70-30) 100 UNIT/ML FlexPen Inject 0.1 mLs (10 Units total) into the skin 2 (two) times daily. (Patient taking differently: Inject 4 Units into the skin 2 (two) times daily as needed (If sugar goes above 180). ) 15 mL 11  . losartan (COZAAR) 50 MG tablet Take 1 tablet (50 mg total) by mouth daily. 90 tablet 0  . predniSONE (DELTASONE) 20 MG tablet 3 tabs po daily x 3 days, then 2 tabs x 3 days, then 1.5 tabs x 3 days, then 1 tab x 3 days, then 0.5 tabs x 3 days 27 tablet 0  . RENVELA 800 MG tablet Take 800-2,400 mg by mouth 3 (three) times daily. 2400 mg (3 caps) with meals and 800 mg (1 cap) with snacks  5  . rosuvastatin (CRESTOR) 20 MG tablet Take 1 tablet (20 mg total) by mouth daily. 90 tablet 0  . SENSIPAR 30 MG tablet   3  . sertraline (ZOLOFT) 25  MG tablet Take 1 tablet (25 mg total) by mouth daily. 90 tablet 0   No current facility-administered medications for this visit.     Family History  Problem Relation Age of Onset  . Cancer Sister     breast cancer  . Stroke Mother   . Heart attack Brother     MI in his 65s  . Heart disease Brother     before age 44  . Breast cancer Sister     Social History   Social History  . Marital status: Married    Spouse name: N/A  . Number of children: 3  . Years of education: N/A  Occupational History  .      Unemployed; used to work as a Radio broadcast assistant   Social History Main Topics  . Smoking status: Never Smoker  . Smokeless tobacco: Never Used  . Alcohol use No  . Drug use: No  . Sexual activity: Yes    Birth control/ protection: None   Other Topics Concern  . Not on file   Social History Narrative  . No narrative on file     ROS: [x]  Positive   [ ]  Negative   [ ]  All sytems reviewed and are negative  Cardiovascular: []  chest pain/pressure []  palpitations []  SOB lying flat []  DOE []  pain in legs while walking []  pain in feet when lying flat []  hx of DVT []  hx of phlebitis [x]  swelling in left arm []  varicose veins  Pulmonary: []  productive cough []  asthma []  wheezing  Neurologic: []  weakness in []  arms []  legs []  numbness in []  arms []  legs [] difficulty speaking or slurred speech []  temporary loss of vision in one eye []  dizziness  Hematologic: []  bleeding problems []  problems with blood clotting easily  GI []  vomiting blood []  blood in stool  GU: [x]  CKD/renal failure  [x]  HD---[x]  M/W/F []  T/T/F []  burning with urination []  blood in urine  Psychiatric: []  hx of major depression  Integumentary: []  rashes []  ulcers  Constitutional: []  fever []  chills  PHYSICAL EXAMINATION:  Vitals:   12/19/16 1421  BP: 96/73  Pulse: 86  Resp: 16  Temp: 98.4 F (36.9 C)   Body mass index is 37.89 kg/m.  General:  WDWN in  NAD Gait: Normal HENT: WNL Pulmonary: normal non-labored breathing , without Rales, rhonchi,  wheezing Cardiac: regular, without  Murmurs, rubs or gallops; without carotid bruit  Skin: without rashes, without ulcers  Vascular Exam/Pulses:  Left arm with mild swelling; there are multiple raised telangiectasias on the lateral aspect of the forearm Left forearm fistula with excellent thrill/bruit Good hand grip  Extremities:  BLE edema Musculoskeletal: no muscle wasting or atrophy  Neurologic: A&O X 3; Moving all extremities equally;  Speech is fluent/normal  Non-Invasive Vascular Imaging:   Upper Extremity Vein Mapping on 6/71/24  Left    Cephalic    Diameter cm Depth cm  Forearm proximal 1.02 0.54  Forearm mid 1.13 0.23  Forearm distal 0.61 0.45   -no internal vessel narrowing, velocity ratio of greater than 3 or residual lumen of less than 50mm noted within the outflow vein or anastomosis -retrograde flow in the left radial artery distal to anastomosis -phasic flow noted in the proximal subclavian vein.  Distal to the clavicle, the subclavian vein flow is continuous.    Fistulogram 03/28/16 by Dr. 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  Because of the tortuosity of the vessel do not think she is a candidate for an attempt at recanalization             #2  Recommended using the fistula.  The patient appears to be able to tolerate the swelling in her arm.  If she gets to the point where she cannot, she would need ligation of her fistula   ASSESSMENT/PLAN: 61 y.o. female with ESRD on dialysis with persistent arm swelling   - pt's arm swelling is better and not bothersome to her at this time -Dr. Donnetta Hutching discussed with pt that she does have an  occlusion of the left subclavian vein, which is why she is having the swelling and the raised telangiectasias on her arm.  He discussed if it worsens or if it is starting to bother her, we can  ligate the fistula and start fresh in the right arm.  She does not want to pursue that at this time and will continue with this fistula.  -fistula pain-may continue heat or ice for relief -she does not have any steal sx -she will f/u as needed.    Jody Locket, PA-C [facility] [phone]  Clinic MD:   Early

## 2016-12-29 ENCOUNTER — Other Ambulatory Visit: Payer: Self-pay | Admitting: *Deleted

## 2016-12-29 MED ORDER — SENSIPAR 30 MG PO TABS: 30.0000 mg | ORAL_TABLET | Freq: Every day | ORAL | 3 refills | Status: DC

## 2017-01-22 ENCOUNTER — Emergency Department (HOSPITAL_COMMUNITY)
Admission: EM | Admit: 2017-01-22 | Discharge: 2017-01-22 | Disposition: A | Discharge status: Home / Self Care | Payer: 59 | Attending: Emergency Medicine | Admitting: Emergency Medicine

## 2017-01-22 ENCOUNTER — Encounter (HOSPITAL_COMMUNITY): Payer: Self-pay

## 2017-01-22 DIAGNOSIS — Z7901 Long term (current) use of anticoagulants: Secondary | ICD-10-CM | POA: Insufficient documentation

## 2017-01-22 DIAGNOSIS — I5032 Chronic diastolic (congestive) heart failure: Secondary | ICD-10-CM | POA: Diagnosis not present

## 2017-01-22 DIAGNOSIS — N186 End stage renal disease: Secondary | ICD-10-CM | POA: Insufficient documentation

## 2017-01-22 DIAGNOSIS — Z794 Long term (current) use of insulin: Secondary | ICD-10-CM | POA: Diagnosis not present

## 2017-01-22 DIAGNOSIS — Y733 Surgical instruments, materials and gastroenterology and urology devices (including sutures) associated with adverse incidents: Secondary | ICD-10-CM | POA: Diagnosis not present

## 2017-01-22 DIAGNOSIS — Z79899 Other long term (current) drug therapy: Secondary | ICD-10-CM | POA: Insufficient documentation

## 2017-01-22 DIAGNOSIS — T82838A Hemorrhage of vascular prosthetic devices, implants and grafts, initial encounter: Secondary | ICD-10-CM

## 2017-01-22 DIAGNOSIS — I132 Hypertensive heart and chronic kidney disease with heart failure and with stage 5 chronic kidney disease, or end stage renal disease: Secondary | ICD-10-CM | POA: Diagnosis not present

## 2017-01-22 DIAGNOSIS — Z8673 Personal history of transient ischemic attack (TIA), and cerebral infarction without residual deficits: Secondary | ICD-10-CM | POA: Insufficient documentation

## 2017-01-22 DIAGNOSIS — E1122 Type 2 diabetes mellitus with diabetic chronic kidney disease: Secondary | ICD-10-CM | POA: Diagnosis not present

## 2017-01-22 DIAGNOSIS — Z992 Dependence on renal dialysis: Secondary | ICD-10-CM | POA: Diagnosis not present

## 2017-01-22 DIAGNOSIS — Z7982 Long term (current) use of aspirin: Secondary | ICD-10-CM | POA: Diagnosis not present

## 2017-01-22 NOTE — ED Notes (Signed)
Pt stable, understands discharge instructions, and reasons for return.   

## 2017-01-22 NOTE — Discharge Instructions (Signed)
The bandage on until tomorrow and he'll be okay to take off. With the bandage slightly before removing. If it starts bleeding again apply direct pressure.

## 2017-01-22 NOTE — ED Provider Notes (Signed)
[facility] DEPT Provider Note   CSN: [id number] Arrival date & time: 01/22/17  1709     History   Chief Complaint Chief Complaint  Patient presents with  . Vascular Access Problem    HPI CONNIE LASATER is a 61 y.o. female.  Patient is a 61 year old female with multiple medical problems including end-stage renal disease on dialysis, CHF, anemia presenting today with bleeding from her dialysis fistula. Patient had her normal dialysis today and finished around 9 AM. Around 3:00 this afternoon she was removing the bandages from dialysis when she started bleeding from her lower puncture site. She was unable to get the bleeding stopped so called 911. Patient has never had an issue like this before denies any ulceration of the skin. She was in her normal state of health today and denies any other complaints at this time. She has no weakness, lightheadedness, dizziness, chest pain, arm tingling or numbness. An 81 mg aspirin but no other blood thinners.   The history is provided by the patient.    Past Medical History:  Diagnosis Date  . Anemia   . Chronic diastolic CHF (congestive heart failure) (HCC)    takes Furosemide daily  . Chronic diastolic heart failure ([place])   . CKD (chronic kidney disease) stage 4, GFR 15-29 ml/min (HCC)   . Constipation   . CVA (cerebral infarction) 1997   no residual deficit  . Diverticulitis   . DM (diabetes mellitus) ([place])    takes Novolog and Metformin daily  . Heart murmur   . History of cardiovascular stress test    a. Myoview Oct 2012 showed EF 49%, no ischemia, LVE  . HLD (hyperlipidemia)    takes Crestor daily  . Hypertension    takes Hydralazine,Losartan,and Labetalol daily  . Pericardial effusion    chronic; felt to be poss related to minoxidil >> DC'd  . Pulmonary hypertension 06/18/2015  . Respiratory failure, acute hypoxic, post-operative 07/08/2015   Requiring ECMO support  . S/P cardiac catheterization    a. R/L HC 06/18/15:   mLAD 30%; severe pulmo HTN with PA sat 43%, CI 1.86, prominent V waves indicative of MR; resting hypoxemia O2 sat 86% on RA  . S/P minimally invasive mitral valve repair 07/08/2015   Complex valvuloplasty including artificial Gore-tex neochord placement x10 and 26 mm Sorin Memo 3D Rechord ring annuloplasty via right mini thoracotomy approach  . Severe mitral regurgitation   . Shortness of breath dyspnea   . Stroke ([place]) 1997   no residual effect  . Vitamin D deficiency   . Wears glasses     Patient Active Problem List   Diagnosis Date Noted  . End stage renal disease ([place]) 10/19/2015  . BMI 31.0-31.9,adult 10/09/2015  . ESRD on dialysis ([place]) 10/09/2015  . Long term (current) use of anticoagulants 09/14/2015  . Cardiomyopathy ([place]) 09/10/2015  . Atrial fibrillation ([place]) 09/10/2015  . Respiratory failure, acute hypoxic, post-operative 07/08/2015  . S/P minimally invasive mitral valve repair 07/08/2015  . Chronic periodontitis 06/30/2015  . Chronic diastolic heart failure ([place])   . Pulmonary hypertension 06/18/2015  . Insulin Dependent T2_DM  ([place]) 05/31/2015  . Pericardial effusion 04/30/2015  . Morbid obesity (BMI 36.05)  03/29/2015  . Insulin dependent diabetes with renal manifestation ([place]) 11/22/2014  . Vitamin D deficiency 03/25/2014  . Medication management 03/25/2014  . Anemia in chronic kidney disease   . Hypertension 08/17/2011  . Hyperlipidemia 08/17/2011    Past Surgical History:  Procedure Laterality Date  .  AV FISTULA PLACEMENT Left 10/22/2015   Procedure: RADIOCEPHALIC ARTERIOVENOUS (AV) FISTULA CREATION;  Surgeon: Serafina Mitchell, MD;  Location: [facility];  Service: Vascular;  Laterality: Left;  . CANNULATION FOR CARDIOPULMONARY BYPASS N/A 07/08/2015   Procedure: CANNULATION FOR ECMO;  Surgeon: Rexene Alberts, MD;  Location: [facility];  Service: Open Heart Surgery;  Laterality: N/A;  . CARDIAC CATHETERIZATION    . CARDIAC CATHETERIZATION N/A 06/18/2015   Procedure:  Right/Left Heart Cath and Coronary Angiography;  Surgeon: Jettie Booze, MD;  Location: [facility] CV [facility];  Service: Cardiovascular;  Laterality: N/A;  . CESAREAN SECTION     x 2  . COLONOSCOPY    . FISTULA SUPERFICIALIZATION Left 12/28/2015   Procedure: SUPERFICIALIZATION LEFT RADIOCEPHALIC FISTULA;  Surgeon: Serafina Mitchell, MD;  Location: [facility];  Service: Vascular;  Laterality: Left;  . MITRAL VALVE REPAIR Right 07/08/2015   Procedure: MINIMALLY INVASIVE MITRAL VALVE REPAIR with a 26 Sorin Memo 3D Rechord;  Surgeon: Rexene Alberts, MD;  Location: [facility];  Service: Open Heart Surgery;  Laterality: Right;  . MULTIPLE EXTRACTIONS WITH ALVEOLOPLASTY N/A 06/30/2015   Procedure: MULTIPLE EXTRACTIONS OF TOOTH #'S 4  AND 30 WITH ALVEOLOPLASTY AND GROSS DEBRIDEMENT  OF REMAINING TEETH;  Surgeon: Lenn Cal, DDS;  Location: [facility];  Service: Oral Surgery;  Laterality: N/A;  . PERIPHERAL VASCULAR CATHETERIZATION Left 03/28/2016   Procedure: A/V Fistulagram;  Surgeon: Serafina Mitchell, MD;  Location: [facility] CV [facility];  Service: Cardiovascular;  Laterality: Left;  lower arm  . TEE WITHOUT CARDIOVERSION N/A 06/08/2015   Procedure: TRANSESOPHAGEAL ECHOCARDIOGRAM (TEE);  Surgeon: Lelon Perla, MD;  Location: [facility] ENDOSCOPY;  Service: Cardiovascular;  Laterality: N/A;  . TEE WITHOUT CARDIOVERSION N/A 07/08/2015   Procedure: TRANSESOPHAGEAL ECHOCARDIOGRAM (TEE);  Surgeon: Rexene Alberts, MD;  Location: [facility];  Service: Open Heart Surgery;  Laterality: N/A;    OB History    Gravida Para Term Preterm AB Living   3 3 3          SAB TAB Ectopic Multiple Live Births                   Home Medications    Prior to Admission medications   Medication Sig Start Date End Date Taking? Authorizing Provider  aspirin EC 81 MG tablet Take 81 mg by mouth daily.    Historical Provider, MD  busPIRone (BUSPAR) 10 MG tablet Take 1 tablet (10 mg total) by mouth 2 (two) times daily. 10/02/16 10/02/17  Unk Pinto, MD  carvedilol (COREG) 6.25 MG tablet Take 1 tablet (6.25 mg total) by mouth 2 (two) times daily. 10/02/16 10/02/17  Unk Pinto, MD  insulin aspart protamine - aspart (NOVOLOG MIX 70/30 FLEXPEN) (70-30) 100 UNIT/ML FlexPen Inject 0.1 mLs (10 Units total) into the skin 2 (two) times daily. Patient taking differently: Inject 4 Units into the skin 2 (two) times daily as needed (If sugar goes above 180).  06/19/15   Scott T Kathlen Mody, PA-C  losartan (COZAAR) 50 MG tablet Take 1 tablet (50 mg total) by mouth daily. 10/02/16   Unk Pinto, MD  predniSONE (DELTASONE) 20 MG tablet 3 tabs po daily x 3 days, then 2 tabs x 3 days, then 1.5 tabs x 3 days, then 1 tab x 3 days, then 0.5 tabs x 3 days 11/09/16   Loma Sousa Forcucci, PA-C  RENVELA 800 MG tablet Take 800-2,400 mg by mouth 3 (three) times daily. 2400 mg (3 caps) with  meals and 800 mg (1 cap) with snacks 12/21/15   Historical Provider, MD  rosuvastatin (CRESTOR) 20 MG tablet Take 1 tablet (20 mg total) by mouth daily. 10/02/16   Unk Pinto, MD  SENSIPAR 30 MG tablet Take 1 tablet (30 mg total) by mouth daily. 12/29/16   Unk Pinto, MD  sertraline (ZOLOFT) 25 MG tablet Take 1 tablet (25 mg total) by mouth daily. 10/02/16   Unk Pinto, MD    Family History Family History  Problem Relation Age of Onset  . Cancer Sister     breast cancer  . Stroke Mother   . Heart attack Brother     MI in his 9s  . Heart disease Brother     before age 15  . Breast cancer Sister     Social History Social History  Substance Use Topics  . Smoking status: Never Smoker  . Smokeless tobacco: Never Used  . Alcohol use No     Allergies   Minoxidil; Lipitor [atorvastatin]; and Ace inhibitors   Review of Systems Review of Systems  All other systems reviewed and are negative.    Physical Exam Updated Vital Signs BP 96/69 (BP Location: Right Arm)   Pulse 96   Temp 98.3 F (36.8 C) (Oral)   Resp 16   SpO2 95%   Physical Exam    Constitutional: She is oriented to person, place, and time. She appears well-developed and well-nourished. No distress.  HENT:  Head: Normocephalic and atraumatic.  Mouth/Throat: Oropharynx is clear and moist.  Eyes: EOM are normal. Pupils are equal, round, and reactive to light.  Cardiovascular: Normal rate and intact distal pulses.   Pulmonary/Chest: Effort normal.  Musculoskeletal: Normal range of motion. She exhibits no edema or tenderness.  Small needle puncture wound bleeding from the dialysis fistula.  No ulcer present.  Positive radial pulse  Neurological: She is alert and oriented to person, place, and time.  Skin: Skin is warm and dry. No rash noted. No erythema.  Psychiatric: She has a normal mood and affect. Her behavior is normal.  Nursing note and vitals reviewed.    ED Treatments / Results  Labs (all labs ordered are listed, but only abnormal results are displayed) Labs Reviewed - No data to display  EKG  EKG Interpretation None       Radiology No results found.  Procedures Procedures (including critical care time)  Medications Ordered in ED Medications - No data to display   Initial Impression / Assessment and Plan / ED Course  I have reviewed the triage vital signs and the nursing notes.  Pertinent labs & imaging results that were available during my care of the patient were reviewed by me and considered in my medical decision making (see chart for details).     Patient is a 61 year old female with multiple medical problems presenting today from bleeding from her AV fistula.  Patient had her normal dialysis session today and finished at 9:30 this morning. Around 3:00 she was taking her bandages off and that's when the bleeding started. She was unable to stop it so called 911. Here there is a small puncture wound with arterial bleeding from the fistula. Patient is otherwise asymptomatic. She has never had issues with this before. She takes an 81 mg  aspirin only. She denies any other symptoms at this time. EMS had wrapped the arm but it bled through. Prior to my seeing the bleed the patient had combat gauze placed. The dressing is currently  drive. Will reevaluate in one hour.  11:58 PM On reevaluation combat gauze was removed and the bleeding had not stopped. Applied direct pressure to the bleed for 5 minutes with resolution of bleeding. Patient had a dressing placed with no further bleeding. She was discharged home.  Final Clinical Impressions(s) / ED Diagnoses   Final diagnoses:  Bleeding from dialysis shunt, initial encounter [facility])    New Prescriptions Discharge Medication List as of 01/22/2017  7:43 PM       Blanchie Dessert, MD 01/22/17 2359

## 2017-01-22 NOTE — ED Notes (Signed)
Dressing from EMS completely saturated, this RN removed dressing but bleeding still extensive. Pressure, Combat gauze, 4x4's, ABD pad and kerlix applied. Per Dr. Maryan Rued, drsg to be kept x 1hr and recheck bleeding status.

## 2017-01-22 NOTE — ED Notes (Signed)
Dressing removed by Dr. Maryan Rued. Direct pressure applied and bleeding has stopped now. Another Kerlix dressing to be applied prior to d/c

## 2017-01-22 NOTE — ED Triage Notes (Signed)
Patient had dialysis this AM on her left forearm graft.  Completed at 0930 with no bleeding.  At 1500 patient took wrap off her access point and bleeding started.  Bleeding controlled by EMS.  Patient A&Ox4

## 2017-02-27 ENCOUNTER — Other Ambulatory Visit: Payer: Self-pay | Admitting: *Deleted

## 2017-02-27 ENCOUNTER — Ambulatory Visit: Payer: Self-pay | Admitting: Internal Medicine

## 2017-02-27 MED ORDER — CARVEDILOL 6.25 MG PO TABS: 6.2500 mg | ORAL_TABLET | Freq: Two times a day (BID) | ORAL | 1 refills | Status: DC

## 2017-02-27 MED ORDER — BUSPIRONE HCL 10 MG PO TABS: 10.0000 mg | ORAL_TABLET | Freq: Two times a day (BID) | ORAL | 1 refills | Status: DC

## 2017-02-27 MED ORDER — SERTRALINE HCL 25 MG PO TABS: 25.0000 mg | ORAL_TABLET | Freq: Every day | ORAL | 1 refills | Status: DC

## 2017-02-27 MED ORDER — LOSARTAN POTASSIUM 50 MG PO TABS: 50.0000 mg | ORAL_TABLET | Freq: Every day | ORAL | 1 refills | Status: DC

## 2017-02-27 MED ORDER — ROSUVASTATIN CALCIUM 20 MG PO TABS: 20.0000 mg | ORAL_TABLET | Freq: Every day | ORAL | 1 refills | Status: DC

## 2017-02-27 MED ORDER — SENSIPAR 30 MG PO TABS: 30.0000 mg | ORAL_TABLET | Freq: Every day | ORAL | 3 refills | Status: DC

## 2017-03-05 NOTE — Progress Notes (Signed)
This very nice 61 y.o. [name] presents for 6 month follow up with Hypertension, Hyperlipidemia,T2_NIDDM and Vitamin D Deficiency. Patient also has ESRD & is on Dialysis 3 x/week.      Patient is treated for HTN (1999) & BP has been controlled at home. Today's BP is at goal - 122/86. Patient has had no complaints of any cardiac type chest pain, palpitations, dyspnea/orthopnea/PND, dizziness, claudication, or dependent edema.     Hyperlipidemia is controlled with diet & meds. Patient denies myalgias or other med SE's. Last Lipids were  Lab Results  Component Value Date   CHOL 155 11/09/2016   HDL 56 11/09/2016   LDLCALC 59 11/09/2016   TRIG 202 (H) 11/09/2016   CHOLHDL 2.8 11/09/2016      Also, the patient has history of Morbid Obesity (BMI 39+) and  Insulin Requiring T2_NIDDM (2006). Patient has been on Dialysis since Aug 2016 following an [place] and since then she has been able to come off of her L[place], Metformin & Invokana and only requires Novolin 70/30 mix 1 -2 x/ month.   She is on the [facility] list at [facility]. She denies symptoms of reactive hypoglycemia, diabetic polys, paresthesias or visual blurring.  Last A1c was not at goal: Lab Results  Component Value Date   HGBA1C 6.5 (H) 11/09/2016      Further, the patient also has history of Vitamin D Deficiency ("12" inn 2008) and supplements vitamin D without any suspected side-effects. Last vitamin D was   Lab Results  Component Value Date   VD25OH 37 08/08/2016   Current Outpatient Prescriptions on File Prior to Visit  Medication Sig  . aspirin EC 81 MG tablet Take 81 mg by mouth daily.  . busPIRone (BUSPAR) 10 MG tablet Take 1 tablet (10 mg total) by mouth 2 (two) times daily.  . carvedilol (COREG) 6.25 MG tablet Take 1 tablet (6.25 mg total) by mouth 2 (two) times daily.  . insulin aspart protamine - aspart (NOVOLOG MIX 70/30 FLEXPEN) (70-30) 100 UNIT/ML FlexPen Inject 0.1 mLs (10 Units total) into the skin 2 (two) times daily.  (Patient taking differently: Inject 4 Units into the skin 2 (two) times daily as needed (If sugar goes above 180). )  . losartan (COZAAR) 50 MG tablet Take 1 tablet (50 mg total) by mouth daily.  Marland Kitchen RENVELA 800 MG tablet Take 800-2,400 mg by mouth 3 (three) times daily. 2400 mg (3 caps) with meals and 800 mg (1 cap) with snacks  . rosuvastatin (CRESTOR) 20 MG tablet Take 1 tablet (20 mg total) by mouth daily.  . SENSIPAR 30 MG tablet Take 1 tablet (30 mg total) by mouth daily.  . sertraline (ZOLOFT) 25 MG tablet Take 1 tablet (25 mg total) by mouth daily.    Allergies  Allergen Reactions  . Minoxidil Other (See Comments)    Pericardial effusion  . Lipitor [Atorvastatin] Other (See Comments)    REACTION: "pain in legs" Tolerates rosuvastatin  . Ace Inhibitors Other (See Comments)    REACTION: "not sure...think it made me drowsy all the time"   PMHx:   Past Medical History:  Diagnosis Date  . Anemia   . Chronic diastolic CHF (congestive heart failure) (HCC)    takes Furosemide daily  . Chronic diastolic heart failure ([place])   . CKD (chronic kidney disease) stage 4, GFR 15-29 ml/min (HCC)   . Constipation   . CVA (cerebral infarction) 1997   no residual deficit  . Diverticulitis   .  DM (diabetes mellitus) ([place])    takes Novolog and Metformin daily  . Heart murmur   . History of cardiovascular stress test    a. Myoview Oct 2012 showed EF 49%, no ischemia, LVE  . HLD (hyperlipidemia)    takes Crestor daily  . Hypertension    takes Hydralazine,Losartan,and Labetalol daily  . Pericardial effusion    chronic; felt to be poss related to minoxidil >> DC'd  . Pulmonary hypertension 06/18/2015  . Respiratory failure, acute hypoxic, post-operative 07/08/2015   Requiring ECMO support  . S/P cardiac catheterization    a. R/L HC 06/18/15:  mLAD 30%; severe pulmo HTN with PA sat 43%, CI 1.86, prominent V waves indicative of MR; resting hypoxemia O2 sat 86% on RA  . S/P minimally invasive  mitral valve repair 07/08/2015   Complex valvuloplasty including artificial Gore-tex neochord placement x10 and 26 mm Sorin Memo 3D Rechord ring annuloplasty via right mini thoracotomy approach  . Severe mitral regurgitation   . Shortness of breath dyspnea   . Stroke ([place]) 1997   no residual effect  . Vitamin D deficiency   . Wears glasses    Immunization History  Administered Date(s) Administered  . Influenza Split 08/20/2012  . PPD Test 03/25/2014, 03/29/2015  . Pneumococcal-Unspecified 07/28/2008  . Tdap 12/28/2008   Past Surgical History:  Procedure Laterality Date  . AV FISTULA PLACEMENT Left 10/22/2015   Procedure: RADIOCEPHALIC ARTERIOVENOUS (AV) FISTULA CREATION;  Surgeon: Serafina Mitchell, MD;  Location: [facility];  Service: Vascular;  Laterality: Left;  . CANNULATION FOR CARDIOPULMONARY BYPASS N/A 07/08/2015   Procedure: CANNULATION FOR ECMO;  Surgeon: Rexene Alberts, MD;  Location: [facility];  Service: Open Heart Surgery;  Laterality: N/A;  . CARDIAC CATHETERIZATION    . CARDIAC CATHETERIZATION N/A 06/18/2015   Procedure: Right/Left Heart Cath and Coronary Angiography;  Surgeon: Jettie Booze, MD;  Location: [facility] CV LAB;  Service: Cardiovascular;  Laterality: N/A;  . CESAREAN SECTION     x 2  . COLONOSCOPY    . FISTULA SUPERFICIALIZATION Left 12/28/2015   Procedure: SUPERFICIALIZATION LEFT RADIOCEPHALIC FISTULA;  Surgeon: Serafina Mitchell, MD;  Location: [facility];  Service: Vascular;  Laterality: Left;  . MITRAL VALVE REPAIR Right 07/08/2015   Procedure: MINIMALLY INVASIVE MITRAL VALVE REPAIR with a 26 Sorin Memo 3D Rechord;  Surgeon: Rexene Alberts, MD;  Location: [facility];  Service: Open Heart Surgery;  Laterality: Right;  . MULTIPLE EXTRACTIONS WITH ALVEOLOPLASTY N/A 06/30/2015   Procedure: MULTIPLE EXTRACTIONS OF TOOTH #'S 4  AND 30 WITH ALVEOLOPLASTY AND GROSS DEBRIDEMENT  OF REMAINING TEETH;  Surgeon: Lenn Cal, DDS;  Location: [facility];  Service: Oral Surgery;   Laterality: N/A;  . PERIPHERAL VASCULAR CATHETERIZATION Left 03/28/2016   Procedure: A/V Fistulagram;  Surgeon: Serafina Mitchell, MD;  Location: [facility] CV LAB;  Service: Cardiovascular;  Laterality: Left;  lower arm  . TEE WITHOUT CARDIOVERSION N/A 06/08/2015   Procedure: TRANSESOPHAGEAL ECHOCARDIOGRAM (TEE);  Surgeon: Lelon Perla, MD;  Location: [facility] ENDOSCOPY;  Service: Cardiovascular;  Laterality: N/A;  . TEE WITHOUT CARDIOVERSION N/A 07/08/2015   Procedure: TRANSESOPHAGEAL ECHOCARDIOGRAM (TEE);  Surgeon: Rexene Alberts, MD;  Location: [place] [facility];  Service: Open Heart Surgery;  Laterality: N/A;   FHx:    Reviewed / unchanged  SHx:    Reviewed / unchanged  Systems Review:  Constitutional: Denies fever, chills, wt changes, headaches, insomnia, fatigue, night sweats, change in appetite. Eyes: Denies redness, blurred vision, diplopia,  discharge, itchy, watery eyes.  ENT: Denies discharge, congestion, post nasal drip, epistaxis, sore throat, earache, hearing loss, dental pain, tinnitus, vertigo, sinus pain, snoring.  CV: Denies chest pain, palpitations, irregular heartbeat, syncope, dyspnea, diaphoresis, orthopnea, PND, claudication or edema. Respiratory: denies cough, dyspnea, DOE, pleurisy, hoarseness, laryngitis, wheezing.  Gastrointestinal: Denies dysphagia, odynophagia, heartburn, reflux, water brash, abdominal pain or cramps, nausea, vomiting, bloating, diarrhea, constipation, hematemesis, melena, hematochezia  or hemorrhoids. Genitourinary: Denies dysuria, frequency, urgency, nocturia, hesitancy, discharge, hematuria or flank pain. Musculoskeletal: Denies arthralgias, myalgias, stiffness, jt. swelling, pain, limping or strain/sprain.  Skin: Denies pruritus, rash, hives, warts, acne, eczema or change in skin lesion(s). Neuro: No weakness, tremor, incoordination, spasms, paresthesia or pain. Psychiatric: Denies confusion, memory loss or sensory loss. Endo: Denies change in weight, skin or  hair change.  Heme/Lymph: No excessive bleeding, bruising or enlarged lymph nodes.  Physical Exam  BP 122/86   Pulse 88   Temp 97.4 F (36.3 C)   Resp 16   Ht 5' (1.524 m)   Wt 201 lb 3.2 oz (91.3 kg)   BMI 39.29 kg/m   Appears over nourished, well groomed  and in no distress.  Eyes: PERRLA, EOMs, conjunctiva no swelling or erythema. Sinuses: No frontal/maxillary tenderness ENT/Mouth: EAC's clear, TM's nl w/o erythema, bulging. Nares clear w/o erythema, swelling, exudates. Oropharynx clear without erythema or exudates. Oral hygiene is good. Tongue normal, non obstructing. Hearing intact.  Neck: Supple. Thyroid nl. Car 2+/2+ without bruits, nodes or JVD. Chest: Respirations nl with BS clear & equal w/o rales, rhonchi, wheezing or stridor.  Cor: Heart sounds normal w/ regular rate and rhythm without sig. murmurs, gallops, clicks or rubs. Peripheral pulses normal and equal  without edema. Left UE AVF with thrill & bruit. Abdomen: Soft & bowel sounds normal. Non-tender w/o guarding, rebound, hernias, masses or organomegaly.  Lymphatics: Unremarkable.  Musculoskeletal: Full ROM all peripheral extremities, joint stability, 5/5 strength and normal gait.  Skin: Warm, dry without exposed rashes, lesions or ecchymosis apparent.  Neuro: Cranial nerves intact, reflexes equal bilaterally. Sensory-motor testing grossly intact. Tendon reflexes grossly intact.  Pysch: Alert & oriented x 3.  Insight and judgement nl & appropriate. No ideations.  Assessment and Plan:  1. Essential hypertension  - Continue medication, monitor blood pressure at home.  - Continue DASH diet. Reminder to go to the ER if any CP,  SOB, nausea, dizziness, severe HA, changes vision/speech,  left arm numbness and tingling and jaw pain.  - CBC with Differential/Platelet - BASIC METABOLIC PANEL WITH GFR - Magnesium  2. Mixed hyperlipidemia  - Continue diet/meds, exercise,& lifestyle modifications.  - Continue  monitor periodic cholesterol/liver & renal functions  - Hepatic function panel - Lipid panel - TSH  3. Insulin Dependent T2_DM  ([place])  - Continue diet, exercise, lifestyle  modifications.  - Monitor appropriate labs.  - Hemoglobin A1c  4. Vitamin D deficiency  - Continue supplementation.  - VITAMIN D 25 Hydroxy  5. ESRD on dialysis ([place])  - CBC with Differential/Platelet - BASIC METABOLIC PANEL WITH GFR  6. Medication management  - CBC with Differential/Platelet - BASIC METABOLIC PANEL WITH GFR - Hepatic function panel - Magnesium - Lipid panel - TSH - Hemoglobin A1c - VITAMIN D 25 Hydroxy        Discussed  regular exercise, BP monitoring, weight control to achieve/maintain BMI less than 25 and discussed med and SE's. Recommended labs to assess and monitor clinical status with further disposition pending results of labs. Over  30 minutes of exam, counseling, chart review was performed.

## 2017-03-05 NOTE — Patient Instructions (Signed)

## 2017-03-06 ENCOUNTER — Encounter: Payer: Self-pay | Admitting: Internal Medicine

## 2017-03-06 ENCOUNTER — Other Ambulatory Visit: Payer: Self-pay | Admitting: *Deleted

## 2017-03-06 ENCOUNTER — Ambulatory Visit (INDEPENDENT_AMBULATORY_CARE_PROVIDER_SITE_OTHER): Payer: 59 | Admitting: Internal Medicine

## 2017-03-06 VITALS — BP 122/86 | HR 88 | Temp 97.4°F | Resp 16 | Ht 60.0 in | Wt 201.2 lb

## 2017-03-06 DIAGNOSIS — E782 Mixed hyperlipidemia: Secondary | ICD-10-CM

## 2017-03-06 DIAGNOSIS — E559 Vitamin D deficiency, unspecified: Secondary | ICD-10-CM | POA: Diagnosis not present

## 2017-03-06 DIAGNOSIS — Z992 Dependence on renal dialysis: Secondary | ICD-10-CM

## 2017-03-06 DIAGNOSIS — Z794 Long term (current) use of insulin: Secondary | ICD-10-CM

## 2017-03-06 DIAGNOSIS — E119 Type 2 diabetes mellitus without complications: Secondary | ICD-10-CM | POA: Diagnosis not present

## 2017-03-06 DIAGNOSIS — Z79899 Other long term (current) drug therapy: Secondary | ICD-10-CM | POA: Diagnosis not present

## 2017-03-06 DIAGNOSIS — N186 End stage renal disease: Secondary | ICD-10-CM

## 2017-03-06 DIAGNOSIS — I10 Essential (primary) hypertension: Secondary | ICD-10-CM | POA: Diagnosis not present

## 2017-03-06 LAB — LIPID PANEL
Cholesterol: 195 mg/dL (ref <200)
HDL: 50 mg/dL — ABNORMAL LOW (ref >50)
LDL Cholesterol: 109 mg/dL — ABNORMAL HIGH (ref <100)
Total CHOL/HDL Ratio: 3.9 Ratio (ref <5.0)
Triglycerides: 178 mg/dL — ABNORMAL HIGH (ref <150)
VLDL: 36 mg/dL — ABNORMAL HIGH (ref <30)

## 2017-03-06 LAB — HEPATIC FUNCTION PANEL
ALT: 14 U/L (ref 6–29)
AST: 15 U/L (ref 10–35)
Albumin: 4.2 g/dL (ref 3.6–5.1)
Alkaline Phosphatase: 115 U/L (ref 33–130)
Bilirubin, Direct: 0.1 mg/dL (ref <=0.2)
Indirect Bilirubin: 0.4 mg/dL (ref 0.2–1.2)
Total Bilirubin: 0.5 mg/dL (ref 0.2–1.2)
Total Protein: 8 g/dL (ref 6.1–8.1)

## 2017-03-06 LAB — CBC WITH DIFFERENTIAL/PLATELET
Basophils Absolute: 39 cells/uL (ref 0–200)
Basophils Relative: 1 %
Eosinophils Absolute: 273 cells/uL (ref 15–500)
Eosinophils Relative: 7 %
HCT: 36.9 % (ref 35.0–45.0)
Hemoglobin: 11.5 g/dL — ABNORMAL LOW (ref 11.7–15.5)
Lymphocytes Relative: 36 %
Lymphs Abs: 1404 cells/uL (ref 850–3900)
MCH: 27.8 pg (ref 27.0–33.0)
MCHC: 31.2 g/dL — ABNORMAL LOW (ref 32.0–36.0)
MCV: 89.1 fL (ref 80.0–100.0)
MPV: 11.1 fL (ref 7.5–12.5)
Monocytes Absolute: 546 cells/uL (ref 200–950)
Monocytes Relative: 14 %
Neutro Abs: 1638 cells/uL (ref 1500–7800)
Neutrophils Relative %: 42 %
Platelets: 215 10*3/uL (ref 140–400)
RBC: 4.14 MIL/uL (ref 3.80–5.10)
RDW: 17.7 % — ABNORMAL HIGH (ref 11.0–15.0)
WBC: 3.9 10*3/uL (ref 3.8–10.8)

## 2017-03-06 LAB — BASIC METABOLIC PANEL WITH GFR
BUN: 35 mg/dL — ABNORMAL HIGH (ref 7–25)
CO2: 30 mmol/L (ref 20–31)
Calcium: 8 mg/dL — ABNORMAL LOW (ref 8.6–10.4)
Chloride: 95 mmol/L — ABNORMAL LOW (ref 98–110)
Creat: 6.7 mg/dL — ABNORMAL HIGH (ref 0.50–0.99)
GFR, Est African American: 7 mL/min — ABNORMAL LOW (ref >=60)
GFR, Est Non African American: 6 mL/min — ABNORMAL LOW (ref >=60)
Glucose, Bld: 130 mg/dL — ABNORMAL HIGH (ref 65–99)
Potassium: 4 mmol/L (ref 3.5–5.3)
Sodium: 138 mmol/L (ref 135–146)

## 2017-03-06 LAB — MAGNESIUM: Magnesium: 2.2 mg/dL (ref 1.5–2.5)

## 2017-03-06 LAB — TSH: TSH: 3.01 mIU/L

## 2017-03-06 MED ORDER — INSULIN ASPART PROT & ASPART (70-30 MIX) 100 UNIT/ML PEN: 10.0000 [IU] | PEN_INJECTOR | Freq: Two times a day (BID) | SUBCUTANEOUS | 3 refills | Status: DC

## 2017-03-07 LAB — HEMOGLOBIN A1C
Hgb A1c MFr Bld: 6.1 % — ABNORMAL HIGH (ref <5.7)
Mean Plasma Glucose: 128 mg/dL

## 2017-03-07 LAB — VITAMIN D 25 HYDROXY (VIT D DEFICIENCY, FRACTURES): Vit D, 25-Hydroxy: 31 ng/mL (ref 30–100)

## 2017-03-16 ENCOUNTER — Other Ambulatory Visit: Payer: Self-pay | Admitting: *Deleted

## 2017-03-16 MED ORDER — SENSIPAR 30 MG PO TABS: 30.0000 mg | ORAL_TABLET | Freq: Every day | ORAL | 3 refills | Status: DC

## 2017-03-23 ENCOUNTER — Other Ambulatory Visit: Payer: Self-pay | Admitting: *Deleted

## 2017-03-23 MED ORDER — CINACALCET HCL 60 MG PO TABS: 60.0000 mg | ORAL_TABLET | Freq: Every day | ORAL | 3 refills | Status: DC

## 2017-04-03 ENCOUNTER — Other Ambulatory Visit: Payer: Self-pay | Admitting: Vascular Surgery

## 2017-04-03 DIAGNOSIS — Z01810 Encounter for preprocedural cardiovascular examination: Secondary | ICD-10-CM

## 2017-04-03 DIAGNOSIS — T82510S Breakdown (mechanical) of surgically created arteriovenous fistula, sequela: Secondary | ICD-10-CM

## 2017-04-13 ENCOUNTER — Encounter: Payer: Self-pay | Admitting: Vascular Surgery

## 2017-04-22 ENCOUNTER — Encounter (HOSPITAL_COMMUNITY): Payer: Self-pay

## 2017-04-22 ENCOUNTER — Emergency Department (HOSPITAL_COMMUNITY)
Admission: EM | Admit: 2017-04-22 | Discharge: 2017-04-22 | Disposition: A | Discharge status: Home / Self Care | Payer: 59 | Attending: Emergency Medicine | Admitting: Emergency Medicine

## 2017-04-22 DIAGNOSIS — Y828 Other medical devices associated with adverse incidents: Secondary | ICD-10-CM | POA: Insufficient documentation

## 2017-04-22 DIAGNOSIS — Z992 Dependence on renal dialysis: Secondary | ICD-10-CM | POA: Diagnosis not present

## 2017-04-22 DIAGNOSIS — Z8673 Personal history of transient ischemic attack (TIA), and cerebral infarction without residual deficits: Secondary | ICD-10-CM | POA: Diagnosis not present

## 2017-04-22 DIAGNOSIS — Z7982 Long term (current) use of aspirin: Secondary | ICD-10-CM | POA: Diagnosis not present

## 2017-04-22 DIAGNOSIS — Z794 Long term (current) use of insulin: Secondary | ICD-10-CM | POA: Insufficient documentation

## 2017-04-22 DIAGNOSIS — T82838A Hemorrhage of vascular prosthetic devices, implants and grafts, initial encounter: Secondary | ICD-10-CM

## 2017-04-22 DIAGNOSIS — Z79899 Other long term (current) drug therapy: Secondary | ICD-10-CM | POA: Insufficient documentation

## 2017-04-22 DIAGNOSIS — N186 End stage renal disease: Secondary | ICD-10-CM | POA: Diagnosis not present

## 2017-04-22 DIAGNOSIS — I5032 Chronic diastolic (congestive) heart failure: Secondary | ICD-10-CM | POA: Insufficient documentation

## 2017-04-22 DIAGNOSIS — I132 Hypertensive heart and chronic kidney disease with heart failure and with stage 5 chronic kidney disease, or end stage renal disease: Secondary | ICD-10-CM | POA: Diagnosis not present

## 2017-04-22 DIAGNOSIS — E1122 Type 2 diabetes mellitus with diabetic chronic kidney disease: Secondary | ICD-10-CM | POA: Insufficient documentation

## 2017-04-22 NOTE — ED Triage Notes (Signed)
Patient here to have left arm dialysis graft checked after bleeding when she takes dressing off. Last accessed on Friday and no bleeding on arrival, NAD

## 2017-04-22 NOTE — ED Notes (Signed)
Restricted left arm due to graft placement. Restricted arm band placed and explained to pt reason for applying armband. Bleeding controlled (2 x 2) was applied prior pt coming to room on blood on gauze.

## 2017-04-22 NOTE — ED Notes (Signed)
Family at bedside. 

## 2017-04-22 NOTE — Discharge Instructions (Signed)
Leave dressing in place this evening.   Ok to go to dialysis tomorrow morning. Return here for new concerns.

## 2017-04-22 NOTE — ED Provider Notes (Signed)
[facility] DEPT Provider Note   CSN: [id number] Arrival date & time: 04/22/17  4818     History   Chief Complaint Chief Complaint  Patient presents with  . bleeding from graft    HPI Jody Nguyen is a 61 y.o. female.  The history is provided by the patient and medical records.    61 y.o. F with hx of CHF, CKD on HD MWF, Prior CVA, Diabetes, hypertension, pulmonary hypertension, mitral regurgitation, presenting to the ED for bleeding from her dialysis fistula.  Patient reports she had full dialysis treatment on Friday, 2 days ago. There are no complications with her treatment. Patient had some intermittent bleeding since this time.  States when dressing is applied bleeding is controlled, but begins bleeding and when it was removed. States she has only had one other episode of this a few months ago which was resolved in the ED. She takes daily aspirin, no other anticoagulation.  Past Medical History:  Diagnosis Date  . Anemia   . Chronic diastolic CHF (congestive heart failure) (HCC)    takes Furosemide daily  . Chronic diastolic heart failure ([place])   . CKD (chronic kidney disease) stage 4, GFR 15-29 ml/min (HCC)   . Constipation   . CVA (cerebral infarction) 1997   no residual deficit  . Diverticulitis   . DM (diabetes mellitus) ([place])    takes Novolog and Metformin daily  . Heart murmur   . History of cardiovascular stress test    a. Myoview Oct 2012 showed EF 49%, no ischemia, LVE  . HLD (hyperlipidemia)    takes Crestor daily  . Hypertension    takes Hydralazine,Losartan,and Labetalol daily  . Pericardial effusion    chronic; felt to be poss related to minoxidil >> DC'd  . Pulmonary hypertension ([place]) 06/18/2015  . Respiratory failure, acute hypoxic, post-operative 07/08/2015   Requiring ECMO support  . S/P cardiac catheterization    a. R/L HC 06/18/15:  mLAD 30%; severe pulmo HTN with PA sat 43%, CI 1.86, prominent V waves indicative of MR; resting hypoxemia O2  sat 86% on RA  . S/P minimally invasive mitral valve repair 07/08/2015   Complex valvuloplasty including artificial Gore-tex neochord placement x10 and 26 mm Sorin Memo 3D Rechord ring annuloplasty via right mini thoracotomy approach  . Severe mitral regurgitation   . Shortness of breath dyspnea   . Stroke ([place]) 1997   no residual effect  . Vitamin D deficiency   . Wears glasses     Patient Active Problem List   Diagnosis Date Noted  . End stage renal disease ([place]) 10/19/2015  . BMI 31.0-31.9,adult 10/09/2015  . ESRD on dialysis ([place]) 10/09/2015  . Long term (current) use of anticoagulants 09/14/2015  . Cardiomyopathy ([place]) 09/10/2015  . Atrial fibrillation ([place]) 09/10/2015  . Respiratory failure, acute hypoxic, post-operative 07/08/2015  . S/P minimally invasive mitral valve repair 07/08/2015  . Chronic periodontitis 06/30/2015  . Chronic diastolic heart failure ([place])   . Pulmonary hypertension ([place]) 06/18/2015  . Insulin Dependent T2_DM  ([place]) 05/31/2015  . Pericardial effusion 04/30/2015  . Morbid obesity (BMI 36.05)  03/29/2015  . Insulin dependent diabetes with renal manifestation ([place]) 11/22/2014  . Vitamin D deficiency 03/25/2014  . Medication management 03/25/2014  . Anemia in chronic kidney disease   . Hypertension 08/17/2011  . Hyperlipidemia 08/17/2011    Past Surgical History:  Procedure Laterality Date  . AV FISTULA PLACEMENT Left 10/22/2015   Procedure: RADIOCEPHALIC ARTERIOVENOUS (AV) FISTULA CREATION;  Surgeon: Serafina Mitchell, MD;  Location: [facility];  Service: Vascular;  Laterality: Left;  . CANNULATION FOR CARDIOPULMONARY BYPASS N/A 07/08/2015   Procedure: CANNULATION FOR ECMO;  Surgeon: Rexene Alberts, MD;  Location: [facility];  Service: Open Heart Surgery;  Laterality: N/A;  . CARDIAC CATHETERIZATION    . CARDIAC CATHETERIZATION N/A 06/18/2015   Procedure: Right/Left Heart Cath and Coronary Angiography;  Surgeon: Jettie Booze, MD;  Location: [facility]  CV LAB;  Service: Cardiovascular;  Laterality: N/A;  . CESAREAN SECTION     x 2  . COLONOSCOPY    . FISTULA SUPERFICIALIZATION Left 12/28/2015   Procedure: SUPERFICIALIZATION LEFT RADIOCEPHALIC FISTULA;  Surgeon: Serafina Mitchell, MD;  Location: [facility];  Service: Vascular;  Laterality: Left;  . MITRAL VALVE REPAIR Right 07/08/2015   Procedure: MINIMALLY INVASIVE MITRAL VALVE REPAIR with a 26 Sorin Memo 3D Rechord;  Surgeon: Rexene Alberts, MD;  Location: [facility];  Service: Open Heart Surgery;  Laterality: Right;  . MULTIPLE EXTRACTIONS WITH ALVEOLOPLASTY N/A 06/30/2015   Procedure: MULTIPLE EXTRACTIONS OF TOOTH #'S 4  AND 30 WITH ALVEOLOPLASTY AND GROSS DEBRIDEMENT  OF REMAINING TEETH;  Surgeon: Lenn Cal, DDS;  Location: [facility];  Service: Oral Surgery;  Laterality: N/A;  . PERIPHERAL VASCULAR CATHETERIZATION Left 03/28/2016   Procedure: A/V Fistulagram;  Surgeon: Serafina Mitchell, MD;  Location: [facility] CV [facility];  Service: Cardiovascular;  Laterality: Left;  lower arm  . TEE WITHOUT CARDIOVERSION N/A 06/08/2015   Procedure: TRANSESOPHAGEAL ECHOCARDIOGRAM (TEE);  Surgeon: Lelon Perla, MD;  [facility];  Service: Cardiovascular;  Laterality: N/A;  . TEE WITHOUT CARDIOVERSION N/A 07/08/2015   Procedure: TRANSESOPHAGEAL ECHOCARDIOGRAM (TEE);  Surgeon: Rexene Alberts, MD;  Location: [facility];  Service: Open Heart Surgery;  Laterality: N/A;    OB History    Gravida Para Term Preterm AB Living   3 3 3          SAB TAB Ectopic Multiple Live Births                   Home Medications    Prior to Admission medications   Medication Sig Start Date End Date Taking? Authorizing Provider  aspirin EC 81 MG tablet Take 81 mg by mouth daily.    [provider]  busPIRone (BUSPAR) 10 MG tablet Take 1 tablet (10 mg total) by mouth 2 (two) times daily. 02/27/17 02/27/18  Unk Pinto, MD  carvedilol (COREG) 6.25 MG tablet Take 1 tablet (6.25 mg total) by mouth 2 (two) times daily.  02/27/17 02/27/18  Unk Pinto, MD  cinacalcet (SENSIPAR) 60 MG tablet Take 1 tablet (60 mg total) by mouth daily. 03/23/17   Unk Pinto, MD  insulin aspart protamine - aspart (NOVOLOG MIX 70/30 FLEXPEN) (70-30) 100 UNIT/ML FlexPen Inject 0.1 mLs (10 Units total) into the skin 2 (two) times daily. 03/06/17   Unk Pinto, MD  losartan (COZAAR) 50 MG tablet Take 1 tablet (50 mg total) by mouth daily. 02/27/17   Unk Pinto, MD  RENVELA 800 MG tablet Take 800-2,400 mg by mouth 3 (three) times daily. 2400 mg (3 caps) with meals and 800 mg (1 cap) with snacks 12/21/15   [provider]  rosuvastatin (CRESTOR) 20 MG tablet Take 1 tablet (20 mg total) by mouth daily. 02/27/17   Unk Pinto, MD  sertraline (ZOLOFT) 25 MG tablet Take 1 tablet (25 mg total) by mouth daily. 02/27/17   Unk Pinto, MD  Family History Family History  Problem Relation Age of Onset  . Cancer Sister        breast cancer  . Stroke Mother   . Heart attack Brother        MI in his 67s  . Heart disease Brother        before age 27  . Breast cancer Sister     Social History Social History  Substance Use Topics  . Smoking status: Never Smoker  . Smokeless tobacco: Never Used  . Alcohol use No     Allergies   Minoxidil; Lipitor [atorvastatin]; and Ace inhibitors   Review of Systems Review of Systems  Skin: Positive for wound.  All other systems reviewed and are negative.    Physical Exam Updated Vital Signs BP (!) 167/95 (BP Location: Right Arm) Comment (BP Location): restricted left arm: graft  Pulse 81   Temp 98.2 F (36.8 C) (Oral)   Resp 18   SpO2 97%   Physical Exam  Constitutional: She is oriented to person, place, and time. She appears well-developed and well-nourished.  HENT:  Head: Normocephalic and atraumatic.  Mouth/Throat: Oropharynx is clear and moist.  Eyes: Conjunctivae and EOM are normal. Pupils are equal, round, and reactive to light.  Neck: Normal range  of motion.  Cardiovascular: Normal rate, regular rhythm and normal heart sounds.   Pulmonary/Chest: Effort normal and breath sounds normal.  Abdominal: Soft. Bowel sounds are normal.  Musculoskeletal: Normal range of motion.  Left forearm dialysis graft with thrill noted, she does have an open area on the graft that is actively bleeding once pressure dressing removed, bleeding is pulsatile  Neurological: She is alert and oriented to person, place, and time.  Skin: Skin is warm and dry.  Psychiatric: She has a normal mood and affect.  Nursing note and vitals reviewed.    ED Treatments / Results  Labs (all labs ordered are listed, but only abnormal results are displayed) Labs Reviewed - No data to display  EKG  EKG Interpretation None       Radiology No results found.  Procedures Procedures (including critical care time)  Medications Ordered in ED Medications - No data to display   Initial Impression / Assessment and Plan / ED Course  I have reviewed the triage vital signs and the nursing notes.  Pertinent labs & imaging results that were available during my care of the patient were reviewed by me and considered in my medical decision making (see chart for details).  60 year old female here with bleeding from her dialysis fistula of left forearm. Last had dialysis 2 days ago, no complications then. Reports similar bleeding episode a few months ago, otherwise has been fine. She takes aspirin but no other anticoagulation. On exam she has a pressure bandage applied over the graft in left forearm. This was removed and there was some pulsatile bleeding. Pressure was reapplied.  Quick clot gauze and pressure dressing applied temporarily.  Will re-check  1:17 PM Dressing removed-- bleeding controlled at this time.  Will monitor for any recurrent bleeding.  Shunt with strong thrill.  2:15 PM Remains without bleeding.  Vitals remain stable.  Shunt still with strong thrill.  No  other complaints and wants to go.  Will be discharged home.  Can follow-up with PCP if any acute issues.  Discussed plan with patient, she acknowledged understanding and agreed with plan of care.  Return precautions given for new or worsening symptoms.  Final Clinical Impressions(s) / ED  Diagnoses   Final diagnoses:  Bleeding from dialysis shunt, initial encounter [facility])    New Prescriptions New Prescriptions   No medications on file     Kathryne Hitch 04/22/17 1439    Duffy Bruce, MD 04/22/17 [phone]

## 2017-04-24 ENCOUNTER — Other Ambulatory Visit: Payer: Self-pay

## 2017-04-24 ENCOUNTER — Ambulatory Visit (HOSPITAL_COMMUNITY)
Admission: RE | Admit: 2017-04-24 | Discharge: 2017-04-24 | Disposition: A | Discharge status: Home / Self Care | Payer: 59 | Source: Ambulatory Visit | Attending: Vascular Surgery | Admitting: Vascular Surgery

## 2017-04-24 ENCOUNTER — Encounter: Payer: Self-pay | Admitting: Vascular Surgery

## 2017-04-24 ENCOUNTER — Ambulatory Visit (INDEPENDENT_AMBULATORY_CARE_PROVIDER_SITE_OTHER): Payer: 59 | Admitting: Vascular Surgery

## 2017-04-24 ENCOUNTER — Ambulatory Visit (INDEPENDENT_AMBULATORY_CARE_PROVIDER_SITE_OTHER)
Admission: RE | Admit: 2017-04-24 | Discharge: 2017-04-24 | Disposition: A | Discharge status: Home / Self Care | Payer: 59 | Source: Ambulatory Visit | Attending: Vascular Surgery | Admitting: Vascular Surgery

## 2017-04-24 VITALS — BP 135/89 | HR 83 | Temp 98.7°F | Resp 20 | Ht 60.0 in | Wt 196.0 lb

## 2017-04-24 DIAGNOSIS — X58XXXS Exposure to other specified factors, sequela: Secondary | ICD-10-CM | POA: Diagnosis not present

## 2017-04-24 DIAGNOSIS — Z01810 Encounter for preprocedural cardiovascular examination: Secondary | ICD-10-CM | POA: Diagnosis not present

## 2017-04-24 DIAGNOSIS — N186 End stage renal disease: Secondary | ICD-10-CM | POA: Diagnosis not present

## 2017-04-24 DIAGNOSIS — T82510S Breakdown (mechanical) of surgically created arteriovenous fistula, sequela: Secondary | ICD-10-CM | POA: Diagnosis not present

## 2017-04-24 DIAGNOSIS — M7989 Other specified soft tissue disorders: Secondary | ICD-10-CM

## 2017-04-24 DIAGNOSIS — Z992 Dependence on renal dialysis: Secondary | ICD-10-CM

## 2017-04-24 DIAGNOSIS — Z01818 Encounter for other preprocedural examination: Secondary | ICD-10-CM | POA: Insufficient documentation

## 2017-04-24 NOTE — Progress Notes (Signed)
[facility]  Patient name: Jody Nguyen MRN: [id number] DOB: [birth year] Sex: female  REASON FOR VISIT: Evaluation for new access for hemodialysis.  HPI: Jody Nguyen is a 61 y.o. female into our practice. She is a left forearm fistula that she is currently using for hemodialysis. She had had the significant swelling in her left arm and had the fistulogram with Dr. 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 Was not felt to be a candidate for endovascular treatment of this. Recommendation was for ligation and new access if this activity problematic. She was continued to use this and is having increasing swelling and now is having difficult to with bleeding following hemodialysis. She presented to the emergency department recently on one occasion.  Past Medical History:  Diagnosis Date  . Anemia   . Chronic diastolic CHF (congestive heart failure) (HCC)    takes Furosemide daily  . Chronic diastolic heart failure ([place])   . CKD (chronic kidney disease) stage 4, GFR 15-29 ml/min (HCC)   . Constipation   . CVA (cerebral infarction) 1997   no residual deficit  . Diverticulitis   . DM (diabetes mellitus) ([place])    takes Novolog and Metformin daily  . Heart murmur   . History of cardiovascular stress test    a. Myoview Oct 2012 showed EF 49%, no ischemia, LVE  . HLD (hyperlipidemia)    takes Crestor daily  . Hypertension    takes Hydralazine,Losartan,and Labetalol daily  . Pericardial effusion    chronic; felt to be poss related to minoxidil >> DC'd  . Pulmonary hypertension ([place]) 06/18/2015  . Respiratory failure, acute hypoxic, post-operative 07/08/2015   Requiring ECMO support  . S/P cardiac catheterization    a. R/L HC 06/18/15:  mLAD 30%; severe pulmo HTN with PA sat 43%, CI 1.86, prominent V waves indicative of MR; resting hypoxemia O2 sat 86% on RA  . S/P minimally invasive mitral valve  repair 07/08/2015   Complex valvuloplasty including artificial Gore-tex neochord placement x10 and 26 mm Sorin Memo 3D Rechord ring annuloplasty via right mini thoracotomy approach  . Severe mitral regurgitation   . Shortness of breath dyspnea   . Stroke ([place]) 1997   no residual effect  . Vitamin D deficiency   . Wears glasses     Family History  Problem Relation Age of Onset  . Cancer Sister        breast cancer  . Stroke Mother   . Heart attack Brother        MI in his 42s  . Heart disease Brother        before age 45  . Breast cancer Sister     SOCIAL HISTORY: Social History  Substance Use Topics  . Smoking status: Never Smoker  . Smokeless tobacco: Never Used  . Alcohol use No    Allergies  Allergen Reactions  . Minoxidil Other (See Comments)    Pericardial effusion  . Lipitor [Atorvastatin] Other (See Comments)    REACTION: "pain in legs" Tolerates rosuvastatin  . Ace Inhibitors Other (See Comments)    REACTION: "not sure...think it made me drowsy all the time"    Current Outpatient Prescriptions  Medication Sig Dispense Refill  . aspirin EC 81 MG tablet Take 81 mg by mouth daily.    . busPIRone (BUSPAR) 10 MG tablet Take 1 tablet (10 mg total) by mouth 2 (two) times daily. 180 tablet 1  .  carvedilol (COREG) 6.25 MG tablet Take 1 tablet (6.25 mg total) by mouth 2 (two) times daily. 180 tablet 1  . cinacalcet (SENSIPAR) 60 MG tablet Take 1 tablet (60 mg total) by mouth daily. (Patient taking differently: Take 30 mg by mouth daily. ) 90 tablet 3  . losartan (COZAAR) 50 MG tablet Take 1 tablet (50 mg total) by mouth daily. 90 tablet 1  . RENVELA 800 MG tablet Take 1,600-4,000 mg by mouth 3 (three) times daily. 4000 mg (5 caps) with meals and 1600 mg (2 caps) with snacks  5  . rosuvastatin (CRESTOR) 20 MG tablet Take 1 tablet (20 mg total) by mouth daily. 90 tablet 1  . sertraline (ZOLOFT) 25 MG tablet Take 1 tablet (25 mg total) by mouth daily. 90 tablet 1  .  insulin aspart protamine - aspart (NOVOLOG MIX 70/30 FLEXPEN) (70-30) 100 UNIT/ML FlexPen Inject 0.1 mLs (10 Units total) into the skin 2 (two) times daily. (Patient not taking: Reported on 04/22/2017) 15 mL 3   No current facility-administered medications for this visit.     REVIEW OF SYSTEMS:  [X]  denotes positive finding, [ ]  denotes negative finding Cardiac  Comments:  Chest pain or chest pressure:    Shortness of breath upon exertion:    Short of breath when lying flat:    Irregular heart rhythm:        Vascular    Pain in calf, thigh, or hip brought on by ambulation:    Pain in feet at night that wakes you up from your sleep:     Blood clot in your veins:    Leg swelling:           PHYSICAL EXAM: Vitals:   04/24/17 0928  BP: 135/89  Pulse: 83  Resp: 20  Temp: 98.7 F (37.1 C)  TempSrc: Oral  SpO2: 97%  Weight: 196 lb (88.9 kg)  Height: 5' (1.524 m)    GENERAL: The patient is a well-nourished female, in no acute distress. The vital signs are documented above.Patient is obese. CARDIOVASCULAR: 2+ right radial pulse. Patent fistula in her left forearm. She had the gauze over this due to recent multiple bleeding episodes. This was all removed. She did have excoriation but no ulceration over the more distal portion towards the radiocephalic anastomosis. Applied hydrogel Curlex and the base lightly and instructed her on the first age should she have bleeding from this. PULMONARY: There is good air exchange  MUSCULOSKELETAL: There are no major deformities or cyanosis. NEUROLOGIC: No focal weakness or paresthesias are detected. SKIN: There are no ulcers or rashes noted. PSYCHIATRIC: The patient has a normal affect.  DATA:  Venous duplex revealed good diameter cephalic vein in her forearm and large cephalic vein in her upper arm on the right. Normal arterial flow on the right.  MEDICAL ISSUES: Progressive difficulty with her left arm fistula with marked swelling and now with  bleeding as well. Have recommended a right arm AV fistula. She does appear to be a candidate for aRadiocephalic fistula by ultrasound. Her vein does run somewhat deep due to her obesity and she understands that she may require second treatment for raising this to the surface. I offered ligation of her left arm fistula and placement of a tunneled dialysis catheter and right arm fistula on the same setting. She wishes to continue use of her left history of possible and placement of a new right arm fistula with eventual ligation when the right arm fistula is matured.  She has dialysis on Monday Wednesday and Friday so therefore will be scheduled on a Tuesday or Thursday. She understands that I'm in the office on Tuesday and Thursday and therefore one of my partners will be doing the procedure    Rosetta Posner, MD FACS [facility] Tel [phone] Pager [phone]

## 2017-04-26 ENCOUNTER — Encounter (HOSPITAL_COMMUNITY): Payer: Self-pay | Admitting: *Deleted

## 2017-04-26 ENCOUNTER — Emergency Department (HOSPITAL_COMMUNITY)
Admission: EM | Admit: 2017-04-26 | Discharge: 2017-04-26 | Disposition: A | Discharge status: Home / Self Care | Payer: 59 | Attending: Emergency Medicine | Admitting: Emergency Medicine

## 2017-04-26 DIAGNOSIS — R55 Syncope and collapse: Secondary | ICD-10-CM

## 2017-04-26 DIAGNOSIS — R42 Dizziness and giddiness: Secondary | ICD-10-CM | POA: Diagnosis not present

## 2017-04-26 DIAGNOSIS — E1122 Type 2 diabetes mellitus with diabetic chronic kidney disease: Secondary | ICD-10-CM | POA: Diagnosis not present

## 2017-04-26 DIAGNOSIS — N184 Chronic kidney disease, stage 4 (severe): Secondary | ICD-10-CM | POA: Insufficient documentation

## 2017-04-26 DIAGNOSIS — I5032 Chronic diastolic (congestive) heart failure: Secondary | ICD-10-CM | POA: Insufficient documentation

## 2017-04-26 DIAGNOSIS — Z79899 Other long term (current) drug therapy: Secondary | ICD-10-CM | POA: Diagnosis not present

## 2017-04-26 DIAGNOSIS — I130 Hypertensive heart and chronic kidney disease with heart failure and stage 1 through stage 4 chronic kidney disease, or unspecified chronic kidney disease: Secondary | ICD-10-CM | POA: Diagnosis not present

## 2017-04-26 LAB — BASIC METABOLIC PANEL
Anion gap: 13 (ref 5–15)
BUN: 33 mg/dL — ABNORMAL HIGH (ref 6–20)
CO2: 26 mmol/L (ref 22–32)
Calcium: 8.9 mg/dL (ref 8.9–10.3)
Chloride: 97 mmol/L — ABNORMAL LOW (ref 101–111)
Creatinine, Ser: 7.65 mg/dL — ABNORMAL HIGH (ref 0.44–1.00)
GFR calc Af Amer: 6 mL/min — ABNORMAL LOW (ref >60)
GFR calc non Af Amer: 5 mL/min — ABNORMAL LOW (ref >60)
Glucose, Bld: 228 mg/dL — ABNORMAL HIGH (ref 65–99)
Potassium: 3.2 mmol/L — ABNORMAL LOW (ref 3.5–5.1)
Sodium: 136 mmol/L (ref 135–145)

## 2017-04-26 LAB — CBC WITH DIFFERENTIAL/PLATELET
Basophils Absolute: 0 10*3/uL (ref 0.0–0.1)
Basophils Relative: 0 %
Eosinophils Absolute: 0.2 10*3/uL (ref 0.0–0.7)
Eosinophils Relative: 3 %
HCT: 29.5 % — ABNORMAL LOW (ref 36.0–46.0)
Hemoglobin: 9.1 g/dL — ABNORMAL LOW (ref 12.0–15.0)
Lymphocytes Relative: 18 %
Lymphs Abs: 1.4 10*3/uL (ref 0.7–4.0)
MCH: 27.7 pg (ref 26.0–34.0)
MCHC: 30.8 g/dL (ref 30.0–36.0)
MCV: 89.7 fL (ref 78.0–100.0)
Monocytes Absolute: 0.5 10*3/uL (ref 0.1–1.0)
Monocytes Relative: 6 %
Neutro Abs: 5.8 10*3/uL (ref 1.7–7.7)
Neutrophils Relative %: 73 %
Platelets: 218 10*3/uL (ref 150–400)
RBC: 3.29 MIL/uL — ABNORMAL LOW (ref 3.87–5.11)
RDW: 18.7 % — ABNORMAL HIGH (ref 11.5–15.5)
WBC: 7.9 10*3/uL (ref 4.0–10.5)

## 2017-04-26 NOTE — ED Triage Notes (Signed)
Pt brought in by [facility] with c/o bleeding dialysis catheter. Pt was at home and the scab came off the dialysis site and it started bleeding profusely for about 45 minutes per pt. Pt had syncopal episode at home prior to EMS arrival. Pt was diaphoretic, BP 80/40 upon EMS arrival. EMS gave 350cc bolus in route. Pt not diaphoretic, BP 84/58 at ED arrival.

## 2017-04-26 NOTE — ED Provider Notes (Signed)
[facility] DEPT Provider Note   CSN: [id number] Arrival date & time: 04/26/17  1415     History   Chief Complaint Chief Complaint  Patient presents with  . Loss of Consciousness  . Vascular Access Problem    dialysis catheter bleeding    HPI Jody Nguyen is a 61 y.o. female.  HPI Patient presents after an episode of near-syncope Notably, this occurred after the patient had abrupt onset of bleeding from her left distal upper extremity fistula. Patient last had dialysis yesterday. She notes over the past week she has had episodes of spontaneous bleeding from the fistula site, is currently being scheduled for placement of a right upper cavity fistula. Today, patient recalls possibly removing the scab from her left arm, subsequently having substantial production of blood from the fistula itself. Application of pressure did not successfully stop the bleeding, until EMS arrived and applied additional force. Currently patient has no active bleeding, has no lightheadedness, no chest pain, has some concern about her episode, but otherwise no new complaints.  Past Medical History:  Diagnosis Date  . Anemia   . Chronic diastolic CHF (congestive heart failure) (HCC)    takes Furosemide daily  . Chronic diastolic heart failure ([place])   . CKD (chronic kidney disease) stage 4, GFR 15-29 ml/min (HCC)   . Constipation   . CVA (cerebral infarction) 1997   no residual deficit  . Diverticulitis   . DM (diabetes mellitus) ([place])    takes Novolog and Metformin daily  . Heart murmur   . History of cardiovascular stress test    a. Myoview Oct 2012 showed EF 49%, no ischemia, LVE  . HLD (hyperlipidemia)    takes Crestor daily  . Hypertension    takes Hydralazine,Losartan,and Labetalol daily  . Pericardial effusion    chronic; felt to be poss related to minoxidil >> DC'd  . Pulmonary hypertension ([place]) 06/18/2015  . Respiratory failure, acute hypoxic, post-operative 07/08/2015   Requiring ECMO support  . S/P cardiac catheterization    a. R/L HC 06/18/15:  mLAD 30%; severe pulmo HTN with PA sat 43%, CI 1.86, prominent V waves indicative of MR; resting hypoxemia O2 sat 86% on RA  . S/P minimally invasive mitral valve repair 07/08/2015   Complex valvuloplasty including artificial Gore-tex neochord placement x10 and 26 mm Sorin Memo 3D Rechord ring annuloplasty via right mini thoracotomy approach  . Severe mitral regurgitation   . Shortness of breath dyspnea   . Stroke ([place]) 1997   no residual effect  . Vitamin D deficiency   . Wears glasses     Patient Active Problem List   Diagnosis Date Noted  . End stage renal disease ([place]) 10/19/2015  . BMI 31.0-31.9,adult 10/09/2015  . ESRD on dialysis ([place]) 10/09/2015  . Long term (current) use of anticoagulants 09/14/2015  . Cardiomyopathy ([place]) 09/10/2015  . Atrial fibrillation ([place]) 09/10/2015  . Respiratory failure, acute hypoxic, post-operative 07/08/2015  . S/P minimally invasive mitral valve repair 07/08/2015  . Chronic periodontitis 06/30/2015  . Chronic diastolic heart failure ([place])   . Pulmonary hypertension ([place]) 06/18/2015  . Insulin Dependent T2_DM  ([place]) 05/31/2015  . Pericardial effusion 04/30/2015  . Morbid obesity (BMI 36.05)  03/29/2015  . Insulin dependent diabetes with renal manifestation ([place]) 11/22/2014  . Vitamin D deficiency 03/25/2014  . Medication management 03/25/2014  . Anemia in chronic kidney disease   . Hypertension 08/17/2011  . Hyperlipidemia 08/17/2011    Past Surgical History:  Procedure Laterality Date  .  AV FISTULA PLACEMENT Left 10/22/2015   Procedure: RADIOCEPHALIC ARTERIOVENOUS (AV) FISTULA CREATION;  Surgeon: Serafina Mitchell, MD;  Location: [facility];  Service: Vascular;  Laterality: Left;  . CANNULATION FOR CARDIOPULMONARY BYPASS N/A 07/08/2015   Procedure: CANNULATION FOR ECMO;  Surgeon: Rexene Alberts, MD;  Location: [facility];  Service: Open Heart Surgery;  Laterality: N/A;  .  CARDIAC CATHETERIZATION    . CARDIAC CATHETERIZATION N/A 06/18/2015   Procedure: Right/Left Heart Cath and Coronary Angiography;  Surgeon: Jettie Booze, MD;  Location: [facility] CV [facility];  Service: Cardiovascular;  Laterality: N/A;  . CESAREAN SECTION     x 2  . COLONOSCOPY    . FISTULA SUPERFICIALIZATION Left 12/28/2015   Procedure: SUPERFICIALIZATION LEFT RADIOCEPHALIC FISTULA;  Surgeon: Serafina Mitchell, MD;  Location: [facility];  Service: Vascular;  Laterality: Left;  . MITRAL VALVE REPAIR Right 07/08/2015   Procedure: MINIMALLY INVASIVE MITRAL VALVE REPAIR with a 26 Sorin Memo 3D Rechord;  Surgeon: Rexene Alberts, MD;  Location: [facility];  Service: Open Heart Surgery;  Laterality: Right;  . MULTIPLE EXTRACTIONS WITH ALVEOLOPLASTY N/A 06/30/2015   Procedure: MULTIPLE EXTRACTIONS OF TOOTH #'S 4  AND 30 WITH ALVEOLOPLASTY AND GROSS DEBRIDEMENT  OF REMAINING TEETH;  Surgeon: Lenn Cal, DDS;  Location: [facility];  Service: Oral Surgery;  Laterality: N/A;  . PERIPHERAL VASCULAR CATHETERIZATION Left 03/28/2016   Procedure: A/V Fistulagram;  Surgeon: Serafina Mitchell, MD;  Location: [facility];  Service: Cardiovascular;  Laterality: Left;  lower arm  . TEE WITHOUT CARDIOVERSION N/A 06/08/2015   Procedure: TRANSESOPHAGEAL ECHOCARDIOGRAM (TEE);  Surgeon: Lelon Perla, MD;  Location: [facility] END[facility];  Service: Cardiovascular;  Laterality: N/A;  . TEE WITHOUT CARDIOVERSION N/A 07/08/2015   Procedure: TRANSESOPHAGEAL ECHOCARDIOGRAM (TEE);  Surgeon: Rexene Alberts, MD;  Location: [facility];  Service: Open Heart Surgery;  Laterality: N/A;    OB History    Gravida Para Term Preterm AB Living   3 3 3          SAB TAB Ectopic Multiple Live Births                   Home Medications    Prior to Admission medications   Medication Sig Start Date End Date Taking? Authorizing Provider  aspirin EC 81 MG tablet Take 81 mg by mouth daily.   Yes [provider]  busPIRone (BUSPAR) 10 MG tablet  Take 1 tablet (10 mg total) by mouth 2 (two) times daily. 02/27/17 02/27/18 Yes Unk Pinto, MD  carvedilol (COREG) 6.25 MG tablet Take 1 tablet (6.25 mg total) by mouth 2 (two) times daily. 02/27/17 02/27/18 Yes Unk Pinto, MD  losartan (COZAAR) 50 MG tablet Take 1 tablet (50 mg total) by mouth daily. 02/27/17  Yes Unk Pinto, MD  RENVELA 800 MG tablet Take 1,600-4,000 mg by mouth 3 (three) times daily. 4000 mg (5 caps) with meals and 1600 mg (2 caps) with snacks 12/21/15  Yes [provider]  rosuvastatin (CRESTOR) 20 MG tablet Take 1 tablet (20 mg total) by mouth daily. 02/27/17  Yes Unk Pinto, MD  SENSIPAR 30 MG tablet Take 30 mg by mouth daily.  04/18/17  Yes [provider]  sertraline (ZOLOFT) 25 MG tablet Take 1 tablet (25 mg total) by mouth daily. 02/27/17  Yes Unk Pinto, MD  insulin aspart protamine - aspart (NOVOLOG MIX 70/30 FLEXPEN) (70-30) 100 UNIT/ML FlexPen Inject 0.1 mLs (10 Units total) into the skin 2 (  two) times daily. Patient not taking: Reported on 04/22/2017 03/06/17   Unk Pinto, MD    Family History Family History  Problem Relation Age of Onset  . Cancer Sister        breast cancer  . Stroke Mother   . Heart attack Brother        MI in his 72s  . Heart disease Brother        before age 44  . Breast cancer Sister     Social History Social History  Substance Use Topics  . Smoking status: Never Smoker  . Smokeless tobacco: Never Used  . Alcohol use No     Allergies   Minoxidil; Lipitor [atorvastatin]; and Ace inhibitors   Review of Systems Review of Systems  Constitutional:       Per HPI, otherwise negative  HENT:       Per HPI, otherwise negative  Respiratory:       Per HPI, otherwise negative  Cardiovascular:       Per HPI, otherwise negative  Gastrointestinal: Negative for vomiting.  Endocrine:       Negative aside from HPI  Genitourinary:       Neg aside from HPI   Musculoskeletal:       Per HPI,  otherwise negative  Skin: Positive for wound.  Allergic/Immunologic: Positive for immunocompromised state.  Neurological: Positive for syncope and light-headedness.     Physical Exam Updated Vital Signs BP 94/61   Pulse 85   Temp 98.1 F (36.7 C) (Oral)   Resp 19   Ht 5' (1.524 m)   Wt 88.9 kg (196 lb)   SpO2 93%   BMI 38.28 kg/m   Physical Exam  Constitutional: She is oriented to person, place, and time. She appears well-developed and well-nourished. No distress.  HENT:  Head: Normocephalic and atraumatic.  Eyes: Conjunctivae and EOM are normal.  Cardiovascular: Normal rate, regular rhythm and intact distal pulses.   Left distal upper extremity fistula palpable with notable thrill. There is no active bleeding, but there is area of fresh exsanguination on the palmar surface  Pulmonary/Chest: Effort normal and breath sounds normal. No stridor. No respiratory distress.  Abdominal: She exhibits no distension.  Musculoskeletal: She exhibits no edema.  Neurological: She is alert and oriented to person, place, and time. No cranial nerve deficit.  Skin: Skin is warm and dry.  Psychiatric: She has a normal mood and affect.  Nursing note and vitals reviewed.    ED Treatments / Results  Labs (all labs ordered are listed, but only abnormal results are displayed) Labs Reviewed  CBC WITH DIFFERENTIAL/PLATELET - Abnormal; Notable for the following:       Result Value   RBC 3.29 (*)    Hemoglobin 9.1 (*)    HCT 29.5 (*)    RDW 18.7 (*)    All other components within normal limits  BASIC METABOLIC PANEL    EKG  EKG Interpretation  Date/Time:  Thursday Apr 26 2017 14:27:30 EDT Ventricular Rate:  87 PR Interval:    QRS Duration: 98 QT Interval:  446 QTC Calculation: 537 R Axis:   -69 Text Interpretation:  Sinus rhythm LAD, consider left anterior fascicular block Low voltage, precordial leads Prolonged QT interval No significant change since last tracing Abnormal ekg  Confirmed by Carmin Muskrat [phone]) on 04/26/2017 3:36:16 PM        Procedures Procedures (including critical care time)    Initial Impression / Assessment and Plan /  ED Course  I have reviewed the triage vital signs and the nursing notes.  Pertinent labs & imaging results that were available during my care of the patient were reviewed by me and considered in my medical decision making (see chart for details).  Patient hypotensive on arrival, has received 350 fluid bolus, with improvement from 70 systolic to 95 systolic. Patient notes that she has labile blood pressure, with readings that range from 588 systolic, to 32/54 systolic.  4:55 PM Patient awake and alert, still no bleeding. She has had a fresh nursing applied, and I counseled her and her husband on methods to stop bleeding a recurrent. With no evidence for ongoing exsanguination, no sustained arrhythmia, no evidence for ACS, or other acute new pathology, patient will follow up with her nephrologist. Final Clinical Impressions(s) / ED Diagnoses   Final diagnoses:  Syncope and collapse     Carmin Muskrat, MD 04/26/17 1655

## 2017-04-26 NOTE — Discharge Instructions (Signed)
As discussed, your evaluation today has been largely reassuring.  But, it is important that you monitor your condition carefully, and do not hesitate to return to the ED if you develop new, or concerning changes in your condition. ? ?Otherwise, please follow-up with your physician for appropriate ongoing care. ? ?

## 2017-04-27 ENCOUNTER — Observation Stay (HOSPITAL_COMMUNITY)
Admission: EM | Admit: 2017-04-27 | Discharge: 2017-04-28 | Disposition: A | Discharge status: Home / Self Care | Payer: 59 | Attending: Internal Medicine | Admitting: Internal Medicine

## 2017-04-27 ENCOUNTER — Encounter (HOSPITAL_COMMUNITY): Payer: Self-pay | Admitting: Nurse Practitioner

## 2017-04-27 DIAGNOSIS — Z794 Long term (current) use of insulin: Secondary | ICD-10-CM | POA: Diagnosis not present

## 2017-04-27 DIAGNOSIS — Z8673 Personal history of transient ischemic attack (TIA), and cerebral infarction without residual deficits: Secondary | ICD-10-CM | POA: Insufficient documentation

## 2017-04-27 DIAGNOSIS — N186 End stage renal disease: Secondary | ICD-10-CM

## 2017-04-27 DIAGNOSIS — T82898A Other specified complication of vascular prosthetic devices, implants and grafts, initial encounter: Secondary | ICD-10-CM | POA: Diagnosis present

## 2017-04-27 DIAGNOSIS — E559 Vitamin D deficiency, unspecified: Secondary | ICD-10-CM | POA: Insufficient documentation

## 2017-04-27 DIAGNOSIS — Z992 Dependence on renal dialysis: Secondary | ICD-10-CM | POA: Insufficient documentation

## 2017-04-27 DIAGNOSIS — I429 Cardiomyopathy, unspecified: Secondary | ICD-10-CM | POA: Insufficient documentation

## 2017-04-27 DIAGNOSIS — I5032 Chronic diastolic (congestive) heart failure: Secondary | ICD-10-CM | POA: Diagnosis not present

## 2017-04-27 DIAGNOSIS — Z823 Family history of stroke: Secondary | ICD-10-CM | POA: Insufficient documentation

## 2017-04-27 DIAGNOSIS — Z6838 Body mass index (BMI) 38.0-38.9, adult: Secondary | ICD-10-CM | POA: Diagnosis not present

## 2017-04-27 DIAGNOSIS — I130 Hypertensive heart and chronic kidney disease with heart failure and stage 1 through stage 4 chronic kidney disease, or unspecified chronic kidney disease: Secondary | ICD-10-CM | POA: Insufficient documentation

## 2017-04-27 DIAGNOSIS — Z951 Presence of aortocoronary bypass graft: Secondary | ICD-10-CM | POA: Diagnosis not present

## 2017-04-27 DIAGNOSIS — Z789 Other specified health status: Secondary | ICD-10-CM | POA: Diagnosis present

## 2017-04-27 DIAGNOSIS — Y841 Kidney dialysis as the cause of abnormal reaction of the patient, or of later complication, without mention of misadventure at the time of the procedure: Secondary | ICD-10-CM | POA: Diagnosis not present

## 2017-04-27 DIAGNOSIS — Z7901 Long term (current) use of anticoagulants: Secondary | ICD-10-CM | POA: Insufficient documentation

## 2017-04-27 DIAGNOSIS — I313 Pericardial effusion (noninflammatory): Secondary | ICD-10-CM | POA: Insufficient documentation

## 2017-04-27 DIAGNOSIS — E785 Hyperlipidemia, unspecified: Secondary | ICD-10-CM | POA: Diagnosis not present

## 2017-04-27 DIAGNOSIS — I10 Essential (primary) hypertension: Secondary | ICD-10-CM | POA: Diagnosis present

## 2017-04-27 DIAGNOSIS — Z452 Encounter for adjustment and management of vascular access device: Secondary | ICD-10-CM

## 2017-04-27 DIAGNOSIS — I272 Pulmonary hypertension, unspecified: Secondary | ICD-10-CM | POA: Insufficient documentation

## 2017-04-27 DIAGNOSIS — D631 Anemia in chronic kidney disease: Secondary | ICD-10-CM | POA: Diagnosis not present

## 2017-04-27 DIAGNOSIS — Z888 Allergy status to other drugs, medicaments and biological substances status: Secondary | ICD-10-CM | POA: Insufficient documentation

## 2017-04-27 DIAGNOSIS — E1122 Type 2 diabetes mellitus with diabetic chronic kidney disease: Principal | ICD-10-CM | POA: Insufficient documentation

## 2017-04-27 DIAGNOSIS — K5900 Constipation, unspecified: Secondary | ICD-10-CM | POA: Diagnosis not present

## 2017-04-27 DIAGNOSIS — E1129 Type 2 diabetes mellitus with other diabetic kidney complication: Secondary | ICD-10-CM

## 2017-04-27 DIAGNOSIS — Z419 Encounter for procedure for purposes other than remedying health state, unspecified: Secondary | ICD-10-CM

## 2017-04-27 DIAGNOSIS — I340 Nonrheumatic mitral (valve) insufficiency: Secondary | ICD-10-CM | POA: Insufficient documentation

## 2017-04-27 DIAGNOSIS — Z803 Family history of malignant neoplasm of breast: Secondary | ICD-10-CM | POA: Diagnosis not present

## 2017-04-27 DIAGNOSIS — E119 Type 2 diabetes mellitus without complications: Secondary | ICD-10-CM

## 2017-04-27 DIAGNOSIS — I4891 Unspecified atrial fibrillation: Secondary | ICD-10-CM | POA: Diagnosis not present

## 2017-04-27 DIAGNOSIS — Z7982 Long term (current) use of aspirin: Secondary | ICD-10-CM | POA: Insufficient documentation

## 2017-04-27 DIAGNOSIS — Z79899 Other long term (current) drug therapy: Secondary | ICD-10-CM | POA: Insufficient documentation

## 2017-04-27 DIAGNOSIS — N189 Chronic kidney disease, unspecified: Secondary | ICD-10-CM

## 2017-04-27 DIAGNOSIS — K0530 Chronic periodontitis, unspecified: Secondary | ICD-10-CM | POA: Diagnosis not present

## 2017-04-27 DIAGNOSIS — Z809 Family history of malignant neoplasm, unspecified: Secondary | ICD-10-CM | POA: Insufficient documentation

## 2017-04-27 DIAGNOSIS — T82838A Hemorrhage of vascular prosthetic devices, implants and grafts, initial encounter: Secondary | ICD-10-CM | POA: Diagnosis present

## 2017-04-27 DIAGNOSIS — F329 Major depressive disorder, single episode, unspecified: Secondary | ICD-10-CM | POA: Insufficient documentation

## 2017-04-27 DIAGNOSIS — Z8249 Family history of ischemic heart disease and other diseases of the circulatory system: Secondary | ICD-10-CM | POA: Insufficient documentation

## 2017-04-27 DIAGNOSIS — Z952 Presence of prosthetic heart valve: Secondary | ICD-10-CM | POA: Insufficient documentation

## 2017-04-27 DIAGNOSIS — R010 Benign and innocent cardiac murmurs: Secondary | ICD-10-CM | POA: Diagnosis not present

## 2017-04-27 LAB — CBC WITH DIFFERENTIAL/PLATELET
Basophils Absolute: 0 10*3/uL (ref 0.0–0.1)
Basophils Relative: 1 %
Eosinophils Absolute: 0.2 10*3/uL (ref 0.0–0.7)
Eosinophils Relative: 3 %
HCT: 26.6 % — ABNORMAL LOW (ref 36.0–46.0)
Hemoglobin: 8.2 g/dL — ABNORMAL LOW (ref 12.0–15.0)
Lymphocytes Relative: 33 %
Lymphs Abs: 2 10*3/uL (ref 0.7–4.0)
MCH: 27.4 pg (ref 26.0–34.0)
MCHC: 30.8 g/dL (ref 30.0–36.0)
MCV: 89 fL (ref 78.0–100.0)
Monocytes Absolute: 0.5 10*3/uL (ref 0.1–1.0)
Monocytes Relative: 8 %
Neutro Abs: 3.4 10*3/uL (ref 1.7–7.7)
Neutrophils Relative %: 55 %
Platelets: 240 10*3/uL (ref 150–400)
RBC: 2.99 MIL/uL — ABNORMAL LOW (ref 3.87–5.11)
RDW: 18.5 % — ABNORMAL HIGH (ref 11.5–15.5)
WBC: 6 10*3/uL (ref 4.0–10.5)

## 2017-04-27 LAB — GLUCOSE, CAPILLARY
Glucose-Capillary: 237 mg/dL — ABNORMAL HIGH (ref 65–99)
Glucose-Capillary: 311 mg/dL — ABNORMAL HIGH (ref 65–99)

## 2017-04-27 LAB — PROTIME-INR
INR: 1.04
Prothrombin Time: 13.6 seconds (ref 11.4–15.2)

## 2017-04-27 LAB — BASIC METABOLIC PANEL
Anion gap: 12 (ref 5–15)
BUN: 24 mg/dL — ABNORMAL HIGH (ref 6–20)
CO2: 30 mmol/L (ref 22–32)
Calcium: 9.8 mg/dL (ref 8.9–10.3)
Chloride: 92 mmol/L — ABNORMAL LOW (ref 101–111)
Creatinine, Ser: 5.52 mg/dL — ABNORMAL HIGH (ref 0.44–1.00)
GFR calc Af Amer: 9 mL/min — ABNORMAL LOW (ref >60)
GFR calc non Af Amer: 7 mL/min — ABNORMAL LOW (ref >60)
Glucose, Bld: 171 mg/dL — ABNORMAL HIGH (ref 65–99)
Potassium: 3.4 mmol/L — ABNORMAL LOW (ref 3.5–5.1)
Sodium: 134 mmol/L — ABNORMAL LOW (ref 135–145)

## 2017-04-27 MED ORDER — ROSUVASTATIN CALCIUM 20 MG PO TABS
20.0000 mg | ORAL_TABLET | Freq: Every day | ORAL | Status: DC
  Administered 2017-04-27: 20 mg via ORAL
  Filled 2017-04-27: qty 1

## 2017-04-27 MED ORDER — SEVELAMER CARBONATE 800 MG PO TABS
4000.0000 mg | ORAL_TABLET | Freq: Three times a day (TID) | ORAL | Status: DC
  Administered 2017-04-28: 4000 mg via ORAL
  Filled 2017-04-27: qty 5

## 2017-04-27 MED ORDER — SERTRALINE HCL 50 MG PO TABS
25.0000 mg | ORAL_TABLET | Freq: Every day | ORAL | Status: DC
  Administered 2017-04-27: 25 mg via ORAL
  Filled 2017-04-27 (×2): qty 1

## 2017-04-27 MED ORDER — CINACALCET HCL 30 MG PO TABS
30.0000 mg | ORAL_TABLET | Freq: Every day | ORAL | Status: DC
  Administered 2017-04-27: 30 mg via ORAL
  Filled 2017-04-27: qty 1

## 2017-04-27 MED ORDER — SEVELAMER CARBONATE 800 MG PO TABS: 1600.0000 mg | ORAL_TABLET | ORAL | Status: DC | PRN

## 2017-04-27 MED ORDER — BUSPIRONE HCL 10 MG PO TABS
10.0000 mg | ORAL_TABLET | Freq: Two times a day (BID) | ORAL | Status: DC
  Administered 2017-04-27 – 2017-04-28 (×2): 10 mg via ORAL
  Filled 2017-04-27 (×2): qty 1

## 2017-04-27 MED ORDER — INSULIN ASPART 100 UNIT/ML ~~LOC~~ SOLN
0.0000 [IU] | SUBCUTANEOUS | Status: DC
  Administered 2017-04-27: 7 [IU] via SUBCUTANEOUS
  Administered 2017-04-28: 1 [IU] via SUBCUTANEOUS

## 2017-04-27 MED ORDER — CEFAZOLIN SODIUM-DEXTROSE 2-4 GM/100ML-% IV SOLN
2.0000 g | INTRAVENOUS | Status: AC
  Administered 2017-04-28: 2 g via INTRAVENOUS
  Filled 2017-04-27: qty 100

## 2017-04-27 NOTE — ED Notes (Signed)
Pt up to nurse first asking how much longer her wait will be, pt made aware that her name is slotted into a room and they are preparing a bed for her in the back and should be calling her back soon. Pt ambulatory, resp e/u, nad noted.

## 2017-04-27 NOTE — ED Notes (Signed)
Vascular surgeon at bedside. Pt okay to eat & drink. Will be NPO after midnight.

## 2017-04-27 NOTE — H&P (Signed)
History and Physical    Jody Nguyen QMG:[id number] DOB: 1956/01/12 DOA: 04/27/2017  PCP: Unk Pinto, MD  Patient coming from: Home  I have personally briefly reviewed patient's old medical records in [place]  Chief Complaint: Bleeding AV fistula  HPI: Jody Nguyen is a 61 y.o. female with medical history significant of ESRD dialysis MWF, last dialysis was earlier today.  Patient sent in to ED by nephrology with plans to consult vascular surgery for ligation of AV fistula.  Patient has had 2 bleeding episodes from L AV fistula (most recent yesterday).  Ulcer over AV fistula site.  Scheduled for placement of R arm AV fistula by Dr. Bridgett Larsson next week.   ED Course: Dr. Oneida Alar saw patient and patient to be admitted to hospitalist for ligation of AV fistula and insertion of dialysis cath tomorrow.   Review of Systems: As per HPI otherwise 10 point review of systems negative.   Past Medical History:  Diagnosis Date  . Anemia   . Chronic diastolic CHF (congestive heart failure) (HCC)    takes Furosemide daily  . Chronic diastolic heart failure ([place])   . CKD (chronic kidney disease) stage 4, GFR 15-29 ml/min (HCC)   . Constipation   . CVA (cerebral infarction) 1997   no residual deficit  . Diverticulitis   . DM (diabetes mellitus) ([place])    takes Novolog and Metformin daily  . Heart murmur   . History of cardiovascular stress test    a. Myoview Oct 2012 showed EF 49%, no ischemia, LVE  . HLD (hyperlipidemia)    takes Crestor daily  . Hypertension    takes Hydralazine,Losartan,and Labetalol daily  . Pericardial effusion    chronic; felt to be poss related to minoxidil >> DC'd  . Pulmonary hypertension ([place]) 06/18/2015  . Respiratory failure, acute hypoxic, post-operative 07/08/2015   Requiring ECMO support  . S/P cardiac catheterization    a. R/L HC 06/18/15:  mLAD 30%; severe pulmo HTN with PA sat 43%, CI 1.86, prominent V waves indicative of MR; resting hypoxemia  O2 sat 86% on RA  . S/P minimally invasive mitral valve repair 07/08/2015   Complex valvuloplasty including artificial Gore-tex neochord placement x10 and 26 mm Sorin Memo 3D Rechord ring annuloplasty via right mini thoracotomy approach  . Severe mitral regurgitation   . Shortness of breath dyspnea   . Stroke ([place]) 1997   no residual effect  . Vitamin D deficiency   . Wears glasses     Past Surgical History:  Procedure Laterality Date  . AV FISTULA PLACEMENT Left 10/22/2015   Procedure: RADIOCEPHALIC ARTERIOVENOUS (AV) FISTULA CREATION;  Surgeon: Serafina Mitchell, MD;  Location: [facility];  Service: Vascular;  Laterality: Left;  . CANNULATION FOR CARDIOPULMONARY BYPASS N/A 07/08/2015   Procedure: CANNULATION FOR ECMO;  Surgeon: Rexene Alberts, MD;  Location: [facility];  Service: Open Heart Surgery;  Laterality: N/A;  . CARDIAC CATHETERIZATION    . CARDIAC CATHETERIZATION N/A 06/18/2015   Procedure: Right/Left Heart Cath and Coronary Angiography;  Surgeon: Jettie Booze, MD;  Location: [facility];  Service: Cardiovascular;  Laterality: N/A;  . CESAREAN SECTION     x 2  . COLONOSCOPY    . FISTULA SUPERFICIALIZATION Left 12/28/2015   Procedure: SUPERFICIALIZATION LEFT RADIOCEPHALIC FISTULA;  Surgeon: Serafina Mitchell, MD;  Location: [facility];  Service: Vascular;  Laterality: Left;  . MITRAL VALVE REPAIR Right 07/08/2015   Procedure: MINIMALLY INVASIVE MITRAL VALVE REPAIR with  a 46 Sorin Memo 3D Rechord;  Surgeon: Rexene Alberts, MD;  Location: [facility];  Service: Open Heart Surgery;  Laterality: Right;  . MULTIPLE EXTRACTIONS WITH ALVEOLOPLASTY N/A 06/30/2015   Procedure: MULTIPLE EXTRACTIONS OF TOOTH #'S 4  AND 30 WITH ALVEOLOPLASTY AND GROSS DEBRIDEMENT  OF REMAINING TEETH;  Surgeon: Lenn Cal, DDS;  Location: [facility];  Service: Oral Surgery;  Laterality: N/A;  . PERIPHERAL VASCULAR CATHETERIZATION Left 03/28/2016   Procedure: A/V Fistulagram;  Surgeon: Serafina Mitchell, MD;  Location: [facility] CV LAB;  Service: Cardiovascular;  Laterality: Left;  lower arm  . TEE WITHOUT CARDIOVERSION N/A 06/08/2015   Procedure: TRANSESOPHAGEAL ECHOCARDIOGRAM (TEE);  Surgeon: Lelon Perla, MD;  [facility] ENDOSCOPY;  Service: Cardiovascular;  Laterality: N/A;  . TEE WITHOUT CARDIOVERSION N/A 07/08/2015   Procedure: TRANSESOPHAGEAL ECHOCARDIOGRAM (TEE);  Surgeon: Rexene Alberts, MD;  Location: [facility];  Service: Open Heart Surgery;  Laterality: N/A;     reports that she has never smoked. She has never used smokeless tobacco. She reports that she does not drink alcohol or use drugs.  Allergies  Allergen Reactions  . Minoxidil Other (See Comments)    Pericardial effusion  . Lipitor [Atorvastatin] Other (See Comments)    REACTION: "pain in legs" Tolerates rosuvastatin  . Ace Inhibitors Other (See Comments)    REACTION: "not sure...think it made me drowsy all the time"    Family History  Problem Relation Age of Onset  . Cancer Sister        breast cancer  . Stroke Mother   . Heart attack Brother        MI in his 63s  . Heart disease Brother        before age 73  . Breast cancer Sister      Prior to Admission medications   Medication Sig Start Date End Date Taking? Authorizing Provider  aspirin EC 81 MG tablet Take 81 mg by mouth daily.    [provider]  busPIRone (BUSPAR) 10 MG tablet Take 1 tablet (10 mg total) by mouth 2 (two) times daily. 02/27/17 02/27/18  Unk Pinto, MD  carvedilol (COREG) 6.25 MG tablet Take 1 tablet (6.25 mg total) by mouth 2 (two) times daily. 02/27/17 02/27/18  Unk Pinto, MD  insulin aspart protamine - aspart (NOVOLOG MIX 70/30 FLEXPEN) (70-30) 100 UNIT/ML FlexPen Inject 0.1 mLs (10 Units total) into the skin 2 (two) times daily. Patient not taking: Reported on 04/22/2017 03/06/17   Unk Pinto, MD  losartan (COZAAR) 50 MG tablet Take 1 tablet (50 mg total) by mouth daily. 02/27/17   Unk Pinto, MD  RENVELA 800 MG tablet Take  1,600-4,000 mg by mouth 3 (three) times daily. 4000 mg (5 caps) with meals and 1600 mg (2 caps) with snacks 12/21/15   [provider]  rosuvastatin (CRESTOR) 20 MG tablet Take 1 tablet (20 mg total) by mouth daily. 02/27/17   Unk Pinto, MD  SENSIPAR 30 MG tablet Take 30 mg by mouth daily.  04/18/17   [provider]  sertraline (ZOLOFT) 25 MG tablet Take 1 tablet (25 mg total) by mouth daily. 02/27/17   Unk Pinto, MD    Physical Exam: Vitals:   04/27/17 1252 04/27/17 1537 04/27/17 1754 04/27/17 1757  BP: 92/66 97/64 107/61   Pulse: (!) 122 (!) 101 (!) 103   Resp: (!) 24 16 16    Temp: 98.4 F (36.9 C)  98.1 F (36.7 C)   TempSrc:  Oral  Oral   SpO2: 94% 96% 96%   Weight:    89.7 kg (197 lb 11.2 oz)  Height:    5' (1.524 m)    Constitutional: NAD, calm, comfortable Eyes: PERRL, lids and conjunctivae normal ENMT: Mucous membranes are moist. Posterior pharynx clear of any exudate or lesions.Normal dentition.  Neck: normal, supple, no masses, no thyromegaly Respiratory: clear to auscultation bilaterally, no wheezing, no crackles. Normal respiratory effort. No accessory muscle use.  Cardiovascular: Regular rate and rhythm, no murmurs / rubs / gallops. No extremity edema. 2+ pedal pulses. No carotid bruits.  Abdomen: no tenderness, no masses palpated. No hepatosplenomegaly. Bowel sounds positive.  Musculoskeletal: no clubbing / cyanosis. No joint deformity upper and lower extremities. Good ROM, no contractures. Normal muscle tone.  Skin: L arm AV fistula under dressing, dressing is C/D/I Neurologic: CN 2-12 grossly intact. Sensation intact, DTR normal. Strength 5/5 in all 4.  Psychiatric: Normal judgment and insight. Alert and oriented x 3. Normal mood.    Labs on Admission: I have personally reviewed following labs and imaging studies  CBC:  Recent Labs Lab 04/26/17 1552 04/27/17 1930  WBC 7.9 6.0  NEUTROABS 5.8 3.4  HGB 9.1* 8.2*  HCT 29.5* 26.6*    MCV 89.7 89.0  PLT 218 222   Basic Metabolic Panel:  Recent Labs Lab 04/26/17 1552 04/27/17 1930  NA 136 134*  K 3.2* 3.4*  CL 97* 92*  CO2 26 30  GLUCOSE 228* 171*  BUN 33* 24*  CREATININE 7.65* 5.52*  CALCIUM 8.9 9.8   GFR: Estimated Creatinine Clearance: 10.5 mL/min (A) (by C-G formula based on SCr of 5.52 mg/dL (H)). Liver Function Tests: No results for input(s): AST, ALT, ALKPHOS, BILITOT, PROT, ALBUMIN in the last 168 hours. No results for input(s): LIPASE, AMYLASE in the last 168 hours. No results for input(s): AMMONIA in the last 168 hours. Coagulation Profile:  Recent Labs Lab 04/27/17 1930  INR 1.04   Cardiac Enzymes: No results for input(s): CKTOTAL, CKMB, CKMBINDEX, TROPONINI in the last 168 hours. BNP (last 3 results) No results for input(s): PROBNP in the last 8760 hours. HbA1C: No results for input(s): HGBA1C in the last 72 hours. CBG: No results for input(s): GLUCAP in the last 168 hours. Lipid Profile: No results for input(s): CHOL, HDL, LDLCALC, TRIG, CHOLHDL, LDLDIRECT in the last 72 hours. Thyroid Function Tests: No results for input(s): TSH, T4TOTAL, FREET4, T3FREE, THYROIDAB in the last 72 hours. Anemia Panel: No results for input(s): VITAMINB12, FOLATE, FERRITIN, TIBC, IRON, RETICCTPCT in the last 72 hours. Urine analysis:    Component Value Date/Time   COLORURINE YELLOW 07/06/2015 1419   APPEARANCEUR CLEAR 07/06/2015 1419   LABSPEC 1.011 07/06/2015 1419   PHURINE 7.0 07/06/2015 1419   GLUCOSEU 250 (A) 07/06/2015 1419   HGBUR NEGATIVE 07/06/2015 1419   BILIRUBINUR NEGATIVE 07/06/2015 1419   KETONESUR NEGATIVE 07/06/2015 1419   PROTEINUR 100 (A) 07/06/2015 1419   UROBILINOGEN 0.2 07/06/2015 1419   NITRITE NEGATIVE 07/06/2015 1419   LEUKOCYTESUR NEGATIVE 07/06/2015 1419    Radiological Exams on Admission: No results found.  EKG: Independently reviewed.  Assessment/Plan Principal Problem:   Problem with dialysis access  [facility]) Active Problems:   Hypertension   Anemia in chronic kidney disease   Insulin dependent diabetes with renal manifestation (HCC)   ESRD on dialysis ([place])    1. Ulcerated AV fistula at risk for further bleeding episodes - 1. No anticoagulants 2. NPO after midnight 3. Plan for ligation  of L arm AVF and dialysis catheter placement tomorrow. 2. HTN - will hold HTN meds for the moment given BP on the lower side 3. DM2 - 1. Only takes 70/30 PRN and hasnt taken in past month 2. Will put on sensitive scale SSI Q4H 4. ESRD - dialysis MWF, last dialysis was today  DVT prophylaxis: SCDs Code Status: Full Family Communication: Husband at bedside Disposition Plan: Home after admission and surgery Consults called: Dr. Oneida Alar has already seen patient Admission status: Admit to inpatient for surgical ligation of AV fistula.   Etta Quill DO [facility] Pager [phone]  If 7AM-7PM, please contact day team taking care of patient [email or web address] Password [email or web address]  04/27/2017, 8:50 PM

## 2017-04-27 NOTE — ED Provider Notes (Signed)
[facility] DEPT Provider Note   CSN: [id number] Arrival date & time: 04/27/17  1246     History   Chief Complaint Chief Complaint  Patient presents with  . Vascular Access Problem    HPI DEMEISHA GERAGHTY is a 61 y.o. female.  HPI    61 year old female presents today for hospital admission regarding left forearm fistula. Patient has had swelling over the left arm and had fistulogram by Dr. 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 Was recommended that she have ligation and new access for this. Patient notes swelling and minor discomfort of the left fistula. She denies any fever, reports she's had dialysis as scheduled Monday Wednesday Friday.   Past Medical History:  Diagnosis Date  . Anemia   . Chronic diastolic CHF (congestive heart failure) (HCC)    takes Furosemide daily  . Chronic diastolic heart failure ([place])   . CKD (chronic kidney disease) stage 4, GFR 15-29 ml/min (HCC)   . Constipation   . CVA (cerebral infarction) 1997   no residual deficit  . Diverticulitis   . DM (diabetes mellitus) ([place])    takes Novolog and Metformin daily  . Heart murmur   . History of cardiovascular stress test    a. Myoview Oct 2012 showed EF 49%, no ischemia, LVE  . HLD (hyperlipidemia)    takes Crestor daily  . Hypertension    takes Hydralazine,Losartan,and Labetalol daily  . Pericardial effusion    chronic; felt to be poss related to minoxidil >> DC'd  . Pulmonary hypertension ([place]) 06/18/2015  . Respiratory failure, acute hypoxic, post-operative 07/08/2015   Requiring ECMO support  . S/P cardiac catheterization    a. R/L HC 06/18/15:  mLAD 30%; severe pulmo HTN with PA sat 43%, CI 1.86, prominent V waves indicative of MR; resting hypoxemia O2 sat 86% on RA  . S/P minimally invasive mitral valve repair 07/08/2015   Complex valvuloplasty including artificial Gore-tex neochord placement x10 and 26 mm Sorin Memo 3D Rechord ring annuloplasty via right mini thoracotomy  approach  . Severe mitral regurgitation   . Shortness of breath dyspnea   . Stroke ([place]) 1997   no residual effect  . Vitamin D deficiency   . Wears glasses     Patient Active Problem List   Diagnosis Date Noted  . End stage renal disease ([place]) 10/19/2015  . BMI 31.0-31.9,adult 10/09/2015  . ESRD on dialysis ([place]) 10/09/2015  . Long term (current) use of anticoagulants 09/14/2015  . Cardiomyopathy ([place]) 09/10/2015  . Atrial fibrillation ([place]) 09/10/2015  . Respiratory failure, acute hypoxic, post-operative 07/08/2015  . S/P minimally invasive mitral valve repair 07/08/2015  . Chronic periodontitis 06/30/2015  . Chronic diastolic heart failure ([place])   . Pulmonary hypertension ([place]) 06/18/2015  . Insulin Dependent T2_DM  ([place]) 05/31/2015  . Pericardial effusion 04/30/2015  . Morbid obesity (BMI 36.05)  03/29/2015  . Insulin dependent diabetes with renal manifestation ([place]) 11/22/2014  . Vitamin D deficiency 03/25/2014  . Medication management 03/25/2014  . Anemia in chronic kidney disease   . Hypertension 08/17/2011  . Hyperlipidemia 08/17/2011    Past Surgical History:  Procedure Laterality Date  . AV FISTULA PLACEMENT Left 10/22/2015   Procedure: RADIOCEPHALIC ARTERIOVENOUS (AV) FISTULA CREATION;  Surgeon: Serafina Mitchell, MD;  Location: [facility];  Service: Vascular;  Laterality: Left;  . CANNULATION FOR CARDIOPULMONARY BYPASS N/A 07/08/2015   Procedure: CANNULATION FOR ECMO;  Surgeon: Rexene Alberts, MD;  Location: [facility];  Service: Open Heart  Surgery;  Laterality: N/A;  . CARDIAC CATHETERIZATION    . CARDIAC CATHETERIZATION N/A 06/18/2015   Procedure: Right/Left Heart Cath and Coronary Angiography;  Surgeon: Jettie Booze, MD;  Location: [facility];  Service: Cardiovascular;  Laterality: N/A;  . CESAREAN SECTION     x 2  . COLONOSCOPY    . FISTULA SUPERFICIALIZATION Left 12/28/2015   Procedure: SUPERFICIALIZATION LEFT RADIOCEPHALIC FISTULA;  Surgeon: Serafina Mitchell, MD;  Location: [facility];  Service: Vascular;  Laterality: Left;  . MITRAL VALVE REPAIR Right 07/08/2015   Procedure: MINIMALLY INVASIVE MITRAL VALVE REPAIR with a 26 Sorin Memo 3D Rechord;  Surgeon: Rexene Alberts, MD;  Location: [facility];  Service: Open Heart Surgery;  Laterality: Right;  . MULTIPLE EXTRACTIONS WITH ALVEOLOPLASTY N/A 06/30/2015   Procedure: MULTIPLE EXTRACTIONS OF TOOTH #'S 4  AND 30 WITH ALVEOLOPLASTY AND GROSS DEBRIDEMENT  OF REMAINING TEETH;  Surgeon: Lenn Cal, DDS;  Location: [facility];  Service: Oral Surgery;  Laterality: N/A;  . PERIPHERAL VASCULAR CATHETERIZATION Left 03/28/2016   Procedure: A/V Fistulagram;  Surgeon: Serafina Mitchell, MD;  Location: [facility];  Service: Cardiovascular;  Laterality: Left;  lower arm  . TEE WITHOUT CARDIOVERSION N/A 06/08/2015   Procedure: TRANSESOPHAGEAL ECHOCARDIOGRAM (TEE);  Surgeon: Lelon Perla, MD;  Location: [facility] ENDOS[facility];  Service: Cardiovascular;  Laterality: N/A;  . TEE WITHOUT CARDIOVERSION N/A 07/08/2015   Procedure: TRANSESOPHAGEAL ECHOCARDIOGRAM (TEE);  Surgeon: Rexene Alberts, MD;  Location: [facility];  Service: Open Heart Surgery;  Laterality: N/A;    OB History    Gravida Para Term Preterm AB Living   3 3 3          SAB TAB Ectopic Multiple Live Births                   Home Medications    Prior to Admission medications   Medication Sig Start Date End Date Taking? Authorizing Provider  aspirin EC 81 MG tablet Take 81 mg by mouth daily.    [provider]  busPIRone (BUSPAR) 10 MG tablet Take 1 tablet (10 mg total) by mouth 2 (two) times daily. 02/27/17 02/27/18  Unk Pinto, MD  carvedilol (COREG) 6.25 MG tablet Take 1 tablet (6.25 mg total) by mouth 2 (two) times daily. 02/27/17 02/27/18  Unk Pinto, MD  insulin aspart protamine - aspart (NOVOLOG MIX 70/30 FLEXPEN) (70-30) 100 UNIT/ML FlexPen Inject 0.1 mLs (10 Units total) into the skin 2 (two) times daily. Patient not taking: Reported on  04/22/2017 03/06/17   Unk Pinto, MD  losartan (COZAAR) 50 MG tablet Take 1 tablet (50 mg total) by mouth daily. 02/27/17   Unk Pinto, MD  RENVELA 800 MG tablet Take 1,600-4,000 mg by mouth 3 (three) times daily. 4000 mg (5 caps) with meals and 1600 mg (2 caps) with snacks 12/21/15   [provider]  rosuvastatin (CRESTOR) 20 MG tablet Take 1 tablet (20 mg total) by mouth daily. 02/27/17   Unk Pinto, MD  SENSIPAR 30 MG tablet Take 30 mg by mouth daily.  04/18/17   [provider]  sertraline (ZOLOFT) 25 MG tablet Take 1 tablet (25 mg total) by mouth daily. 02/27/17   Unk Pinto, MD    Family History Family History  Problem Relation Age of Onset  . Cancer Sister        breast cancer  . Stroke Mother   . Heart attack Brother  MI in his 72s  . Heart disease Brother        before age 46  . Breast cancer Sister     Social History Social History  Substance Use Topics  . Smoking status: Never Smoker  . Smokeless tobacco: Never Used  . Alcohol use No     Allergies   Minoxidil; Lipitor [atorvastatin]; and Ace inhibitors   Review of Systems Review of Systems  All other systems reviewed and are negative.   Physical Exam Updated Vital Signs BP 107/61 (BP Location: Right Arm)   Pulse (!) 103   Temp 98.1 F (36.7 C) (Oral)   Resp 16   Ht 5' (1.524 m)   Wt 89.7 kg (197 lb 11.2 oz)   SpO2 96%   BMI 38.61 kg/m   Physical Exam  Constitutional: She is oriented to person, place, and time. She appears well-developed and well-nourished.  HENT:  Head: Normocephalic and atraumatic.  Eyes: Conjunctivae are normal. Pupils are equal, round, and reactive to light. Right eye exhibits no discharge. Left eye exhibits no discharge. No scleral icterus.  Neck: Normal range of motion. No JVD present. No tracheal deviation present.  Pulmonary/Chest: Effort normal. No stridor.  Musculoskeletal:  Left fistula cover with dressing, no surrounding cellulitis    Neurological: She is alert and oriented to person, place, and time. Coordination normal.  Psychiatric: She has a normal mood and affect. Her behavior is normal. Judgment and thought content normal.  Nursing note and vitals reviewed.   ED Treatments / Results  Labs (all labs ordered are listed, but only abnormal results are displayed) Labs Reviewed  CBC WITH DIFFERENTIAL/PLATELET - Abnormal; Notable for the following:       Result Value   RBC 2.99 (*)    Hemoglobin 8.2 (*)    HCT 26.6 (*)    RDW 18.5 (*)    All other components within normal limits  BASIC METABOLIC PANEL - Abnormal; Notable for the following:    Sodium 134 (*)    Potassium 3.4 (*)    Chloride 92 (*)    Glucose, Bld 171 (*)    BUN 24 (*)    Creatinine, Ser 5.52 (*)    GFR calc non Af Amer 7 (*)    GFR calc Af Amer 9 (*)    All other components within normal limits  PROTIME-INR  PROTIME-INR  BASIC METABOLIC PANEL  CBC    EKG  EKG Interpretation None       Radiology No results found.  Procedures Procedures (including critical care time)  Medications Ordered in ED Medications  ceFAZolin (ANCEF) IVPB 2g/100 mL premix (0 g Intravenous Hold 04/27/17 1912)     Initial Impression / Assessment and Plan / ED Course  I have reviewed the triage vital signs and the nursing notes.  Pertinent labs & imaging results that were available during my care of the patient were reviewed by me and considered in my medical decision making (see chart for details).      Final Clinical Impressions(s) / ED Diagnoses   Final diagnoses:  Problem with vascular access  ESRD on dialysis ([place])   Labs: CBC, BMP  Imaging:  Consults: Vascular Surgery   Therapeutics:   Discharge Meds:   Assessment/Plan: 61 year old female presents for hospital admission for tunneled dialysis catheter in right arm fistula. Patient very upset that she had awaiting the waiting room for several hours prior to being seen.They report that  they are taking photos because it  is "bullshit" that they are placed in the hallway. I informed them that this was the only available bed at the time and we needed them out of the waiting room to initiate care and disposition.  Patient notes arm is unchanged. Vascular surgery consulted who s Patient notes arm is unchanged. Vascular surgery consulted who recommends triad admit for surgery tomorrow. Labs ordered, hospitalist consulted.    New Prescriptions New Prescriptions   No medications on file     Francee Gentile 04/27/17 2021    Varney Biles, MD 04/29/17 [phone]

## 2017-04-27 NOTE — Consult Note (Signed)
Referring Physician: Otelia Santee, MD, nephrology service  Patient name: Jody Nguyen MRN: [id number] DOB: [birth year] Sex: female  REASON FOR CONSULT: bleeding AVFistula  HPI: Jody Nguyen is a 61 y.o. female with 2 prior bleeding episodes left radial cephalic AV fistula.  Last episode was yesterday and was seen in ER.  She was previously offered ligation of this fistula about 10 days ago but refused.  She is scheduled for placement of a right arm AV fistula by Dr Bridgett Larsson next week.  She is MWF dialysis.  Past Medical History:  Diagnosis Date  . Anemia   . Chronic diastolic CHF (congestive heart failure) (HCC)    takes Furosemide daily  . Chronic diastolic heart failure ([place])   . CKD (chronic kidney disease) stage 4, GFR 15-29 ml/min (HCC)   . Constipation   . CVA (cerebral infarction) 1997   no residual deficit  . Diverticulitis   . DM (diabetes mellitus) ([place])    takes Novolog and Metformin daily  . Heart murmur   . History of cardiovascular stress test    a. Myoview Oct 2012 showed EF 49%, no ischemia, LVE  . HLD (hyperlipidemia)    takes Crestor daily  . Hypertension    takes Hydralazine,Losartan,and Labetalol daily  . Pericardial effusion    chronic; felt to be poss related to minoxidil >> DC'd  . Pulmonary hypertension ([place]) 06/18/2015  . Respiratory failure, acute hypoxic, post-operative 07/08/2015   Requiring ECMO support  . S/P cardiac catheterization    a. R/L HC 06/18/15:  mLAD 30%; severe pulmo HTN with PA sat 43%, CI 1.86, prominent V waves indicative of MR; resting hypoxemia O2 sat 86% on RA  . S/P minimally invasive mitral valve repair 07/08/2015   Complex valvuloplasty including artificial Gore-tex neochord placement x10 and 26 mm Sorin Memo 3D Rechord ring annuloplasty via right mini thoracotomy approach  . Severe mitral regurgitation   . Shortness of breath dyspnea   . Stroke ([place]) 1997   no residual effect  . Vitamin D deficiency   . Wears glasses     Past Surgical History:  Procedure Laterality Date  . AV FISTULA PLACEMENT Left 10/22/2015   Procedure: RADIOCEPHALIC ARTERIOVENOUS (AV) FISTULA CREATION;  Surgeon: Serafina Mitchell, MD;  Location: [facility];  Service: Vascular;  Laterality: Left;  . CANNULATION FOR CARDIOPULMONARY BYPASS N/A 07/08/2015   Procedure: CANNULATION FOR ECMO;  Surgeon: Rexene Alberts, MD;  Location: [facility];  Service: Open Heart Surgery;  Laterality: N/A;  . CARDIAC CATHETERIZATION    . CARDIAC CATHETERIZATION N/A 06/18/2015   Procedure: Right/Left Heart Cath and Coronary Angiography;  Surgeon: Jettie Booze, MD;  Location: [facility];  Service: Cardiovascular;  Laterality: N/A;  . CESAREAN SECTION     x 2  . COLONOSCOPY    . FISTULA SUPERFICIALIZATION Left 12/28/2015   Procedure: SUPERFICIALIZATION LEFT RADIOCEPHALIC FISTULA;  Surgeon: Serafina Mitchell, MD;  Location: [facility];  Service: Vascular;  Laterality: Left;  . MITRAL VALVE REPAIR Right 07/08/2015   Procedure: MINIMALLY INVASIVE MITRAL VALVE REPAIR with a 26 Sorin Memo 3D Rechord;  Surgeon: Rexene Alberts, MD;  Location: [facility];  Service: Open Heart Surgery;  Laterality: Right;  . MULTIPLE EXTRACTIONS WITH ALVEOLOPLASTY N/A 06/30/2015   Procedure: MULTIPLE EXTRACTIONS OF TOOTH #'S 4  AND 30 WITH ALVEOLOPLASTY AND GROSS DEBRIDEMENT  OF REMAINING TEETH;  Surgeon: Lenn Cal, DDS;  Location: [facility];  Service: Oral Surgery;  Laterality:  N/A;  . PERIPHERAL VASCULAR CATHETERIZATION Left 03/28/2016   Procedure: A/V Fistulagram;  Surgeon: Serafina Mitchell, MD;  Location: [facility];  Service: Cardiovascular;  Laterality: Left;  lower arm  . TEE WITHOUT CARDIOVERSION N/A 06/08/2015   Procedure: TRANSESOPHAGEAL ECHOCARDIOGRAM (TEE);  Surgeon: Lelon Perla, MD;  Location: [facility] ENDOSCOPY;  Service: Cardiovascular;  Laterality: N/A;  . TEE WITHOUT CARDIOVERSION N/A 07/08/2015   Procedure: TRANSESOPHAGEAL ECHOCARDIOGRAM (TEE);  Surgeon: Rexene Alberts, MD;   Location: [facility];  Service: Open Heart Surgery;  Laterality: N/A;    Family History  Problem Relation Age of Onset  . Cancer Sister        breast cancer  . Stroke Mother   . Heart attack Brother        MI in his 32s  . Heart disease Brother        before age 82  . Breast cancer Sister     SOCIAL HISTORY: Social History   Social History  . Marital status: Married    Spouse name: N/A  . Number of children: 3  . Years of education: N/A   Occupational History  .      Unemployed; used to work as a Radio broadcast assistant   Social History Main Topics  . Smoking status: Never Smoker  . Smokeless tobacco: Never Used  . Alcohol use No  . Drug use: No  . Sexual activity: Not on file   Other Topics Concern  . Not on file   Social History Narrative  . No narrative on file    Allergies  Allergen Reactions  . Minoxidil Other (See Comments)    Pericardial effusion  . Lipitor [Atorvastatin] Other (See Comments)    REACTION: "pain in legs" Tolerates rosuvastatin  . Ace Inhibitors Other (See Comments)    REACTION: "not sure...think it made me drowsy all the time"    Current Facility-Administered Medications  Medication Dose Route Frequency Provider Last Rate Last Dose  . [START ON 04/28/2017] ceFAZolin (ANCEF) IVPB 2g/100 mL premix  2 g Intravenous On Call Gabriel Earing, PA-C       Current Outpatient Prescriptions  Medication Sig Dispense Refill  . aspirin EC 81 MG tablet Take 81 mg by mouth daily.    . busPIRone (BUSPAR) 10 MG tablet Take 1 tablet (10 mg total) by mouth 2 (two) times daily. 180 tablet 1  . carvedilol (COREG) 6.25 MG tablet Take 1 tablet (6.25 mg total) by mouth 2 (two) times daily. 180 tablet 1  . insulin aspart protamine - aspart (NOVOLOG MIX 70/30 FLEXPEN) (70-30) 100 UNIT/ML FlexPen Inject 0.1 mLs (10 Units total) into the skin 2 (two) times daily. (Patient not taking: Reported on 04/22/2017) 15 mL 3  . losartan (COZAAR) 50 MG tablet Take 1 tablet  (50 mg total) by mouth daily. 90 tablet 1  . RENVELA 800 MG tablet Take 1,600-4,000 mg by mouth 3 (three) times daily. 4000 mg (5 caps) with meals and 1600 mg (2 caps) with snacks  5  . rosuvastatin (CRESTOR) 20 MG tablet Take 1 tablet (20 mg total) by mouth daily. 90 tablet 1  . SENSIPAR 30 MG tablet Take 30 mg by mouth daily.     . sertraline (ZOLOFT) 25 MG tablet Take 1 tablet (25 mg total) by mouth daily. 90 tablet 1    ROS:   General:  No weight loss, Fever, chills  Pulmonary: No home oxygen, no productive cough, no hemoptysis,  No  asthma or wheezing  Skin: No rashes   Physical Examination  Vitals:   04/27/17 1252 04/27/17 1537 04/27/17 1754 04/27/17 1757  BP: 92/66 97/64 107/61   Pulse: (!) 122 (!) 101 (!) 103   Resp: (!) 24 16 16    Temp: 98.4 F (36.9 C)  98.1 F (36.7 C)   TempSrc: Oral  Oral   SpO2: 94% 96% 96%   Weight:    197 lb 11.2 oz (89.7 kg)  Height:    5' (1.524 m)    Body mass index is 38.61 kg/m.  General:  Alert and oriented, no acute distress HEENT: Normal Skin: No rash, left forearm AVF with dressing not currently bleeding  DATA:  CBC    Component Value Date/Time   WBC 7.9 04/26/2017 1552   RBC 3.29 (L) 04/26/2017 1552   HGB 9.1 (L) 04/26/2017 1552   HGB 12.1 03/03/2013 0955   HCT 29.5 (L) 04/26/2017 1552   HCT 37.4 03/03/2013 0955   PLT 218 04/26/2017 1552   PLT 288 03/03/2013 0955   MCV 89.7 04/26/2017 1552   MCV 77.9 (L) 03/03/2013 0955   MCH 27.7 04/26/2017 1552   MCHC 30.8 04/26/2017 1552   RDW 18.7 (H) 04/26/2017 1552   RDW 16.1 (H) 03/03/2013 0955   LYMPHSABS 1.4 04/26/2017 1552   LYMPHSABS 1.8 03/03/2013 0955   MONOABS 0.5 04/26/2017 1552   MONOABS 0.4 03/03/2013 0955   EOSABS 0.2 04/26/2017 1552   EOSABS 0.3 03/03/2013 0955   BASOSABS 0.0 04/26/2017 1552   BASOSABS 0.0 03/03/2013 0955    BMET    Component Value Date/Time   NA 136 04/26/2017 1552   NA 140 03/03/2013 0955   K 3.2 (L) 04/26/2017 1552   K 3.8  03/03/2013 0955   CL 97 (L) 04/26/2017 1552   CL 99 03/03/2013 0955   CO2 26 04/26/2017 1552   CO2 29 03/03/2013 0955   GLUCOSE 228 (H) 04/26/2017 1552   GLUCOSE 210 (H) 03/03/2013 0955   BUN 33 (H) 04/26/2017 1552   BUN 28.1 (H) 03/03/2013 0955   CREATININE 7.65 (H) 04/26/2017 1552   CREATININE 6.70 (H) 03/06/2017 0943   CREATININE 1.9 (H) 03/03/2013 0955   CALCIUM 8.9 04/26/2017 1552   CALCIUM 9.9 03/03/2013 0955   GFRNONAA 5 (L) 04/26/2017 1552   GFRNONAA 6 (L) 03/06/2017 0943   GFRAA 6 (L) 04/26/2017 1552   GFRAA 7 (L) 03/06/2017 3893     ASSESSMENT:  Ulcerated left arm AV fistula at high risk for bleeding   PLAN:  1. NPO p midnight.  2. Ligate left arm AVF and place dialysis catheter tomorrow.  3. Hospitalist admission due to her multiple host of medical problems  She should be able to d/c home after operation tomorrow if no other problems and return Tuesday for new fistula.   Ruta Hinds, MD [facility] Office: [phone] Pager: [phone]

## 2017-04-27 NOTE — ED Notes (Addendum)
Notified Dr Thomasene Lot of this case, Sepsis Standing orders will not be initiated at this time

## 2017-04-27 NOTE — ED Notes (Addendum)
Per nephrologist: Pt to be admitted by hospitalist. Vascular surgeon to be paged upon arrival, pt has eaten already this morning but will be admitted and scheduled for surgery to ligate and place a catheter to left wrist fistula in the morning.

## 2017-04-27 NOTE — ED Triage Notes (Addendum)
Pt presents with c/o vascular access problem. She was sent by her doctor for admission for possible surgery to her left arm graft. She has a sore at her graft site that continues to scab and bleed for the past two weeks. The sore is painful and will not heal. She has been able to complete all scheduled dialysis appointments this week.

## 2017-04-27 NOTE — ED Notes (Signed)
Pt presents with intact bandage to L arm. No active hemorrhage noted through bandage at this time.

## 2017-04-28 ENCOUNTER — Inpatient Hospital Stay (HOSPITAL_COMMUNITY): Payer: 59 | Admitting: Certified Registered Nurse Anesthetist

## 2017-04-28 ENCOUNTER — Ambulatory Visit (HOSPITAL_COMMUNITY): Admission: RE | Admit: 2017-04-28 | Payer: 59 | Source: Ambulatory Visit | Admitting: Vascular Surgery

## 2017-04-28 ENCOUNTER — Inpatient Hospital Stay (HOSPITAL_COMMUNITY): Payer: 59

## 2017-04-28 ENCOUNTER — Encounter (HOSPITAL_COMMUNITY)
Admission: EM | Disposition: A | Discharge status: Home / Self Care | Payer: Self-pay | Source: Home / Self Care | Attending: Emergency Medicine

## 2017-04-28 ENCOUNTER — Encounter (HOSPITAL_COMMUNITY): Payer: Self-pay | Admitting: Certified Registered Nurse Anesthetist

## 2017-04-28 DIAGNOSIS — T82838A Hemorrhage of vascular prosthetic devices, implants and grafts, initial encounter: Secondary | ICD-10-CM | POA: Diagnosis present

## 2017-04-28 DIAGNOSIS — Z794 Long term (current) use of insulin: Secondary | ICD-10-CM | POA: Diagnosis not present

## 2017-04-28 DIAGNOSIS — N186 End stage renal disease: Secondary | ICD-10-CM | POA: Diagnosis not present

## 2017-04-28 DIAGNOSIS — E1129 Type 2 diabetes mellitus with other diabetic kidney complication: Secondary | ICD-10-CM | POA: Diagnosis not present

## 2017-04-28 DIAGNOSIS — N185 Chronic kidney disease, stage 5: Secondary | ICD-10-CM | POA: Diagnosis not present

## 2017-04-28 DIAGNOSIS — Z992 Dependence on renal dialysis: Secondary | ICD-10-CM | POA: Diagnosis not present

## 2017-04-28 DIAGNOSIS — T82838D Hemorrhage of vascular prosthetic devices, implants and grafts, subsequent encounter: Secondary | ICD-10-CM

## 2017-04-28 DIAGNOSIS — D631 Anemia in chronic kidney disease: Secondary | ICD-10-CM

## 2017-04-28 DIAGNOSIS — I10 Essential (primary) hypertension: Secondary | ICD-10-CM | POA: Diagnosis not present

## 2017-04-28 DIAGNOSIS — T82898D Other specified complication of vascular prosthetic devices, implants and grafts, subsequent encounter: Secondary | ICD-10-CM | POA: Diagnosis not present

## 2017-04-28 HISTORY — PX: INSERTION OF DIALYSIS CATHETER: SHX1324

## 2017-04-28 HISTORY — PX: LIGATION OF ARTERIOVENOUS  FISTULA: SHX5948

## 2017-04-28 LAB — GLUCOSE, CAPILLARY
Glucose-Capillary: 144 mg/dL — ABNORMAL HIGH (ref 65–99)
Glucose-Capillary: 168 mg/dL — ABNORMAL HIGH (ref 65–99)
Glucose-Capillary: 157 mg/dL — ABNORMAL HIGH (ref 65–99)
Glucose-Capillary: 133 mg/dL — ABNORMAL HIGH (ref 65–99)

## 2017-04-28 LAB — HIV ANTIBODY (ROUTINE TESTING W REFLEX): HIV Screen 4th Generation wRfx: NONREACTIVE

## 2017-04-28 LAB — BASIC METABOLIC PANEL
Anion gap: 14 (ref 5–15)
BUN: 30 mg/dL — ABNORMAL HIGH (ref 6–20)
CO2: 26 mmol/L (ref 22–32)
Calcium: 8.9 mg/dL (ref 8.9–10.3)
Chloride: 91 mmol/L — ABNORMAL LOW (ref 101–111)
Creatinine, Ser: 6.87 mg/dL — ABNORMAL HIGH (ref 0.44–1.00)
GFR calc Af Amer: 7 mL/min — ABNORMAL LOW (ref >60)
GFR calc non Af Amer: 6 mL/min — ABNORMAL LOW (ref >60)
Glucose, Bld: 155 mg/dL — ABNORMAL HIGH (ref 65–99)
Potassium: 3.1 mmol/L — ABNORMAL LOW (ref 3.5–5.1)
Sodium: 131 mmol/L — ABNORMAL LOW (ref 135–145)

## 2017-04-28 LAB — CBC
HCT: 23.4 % — ABNORMAL LOW (ref 36.0–46.0)
Hemoglobin: 7.3 g/dL — ABNORMAL LOW (ref 12.0–15.0)
MCH: 27.3 pg (ref 26.0–34.0)
MCHC: 31.2 g/dL (ref 30.0–36.0)
MCV: 87.6 fL (ref 78.0–100.0)
Platelets: 186 10*3/uL (ref 150–400)
RBC: 2.67 MIL/uL — ABNORMAL LOW (ref 3.87–5.11)
RDW: 18.2 % — ABNORMAL HIGH (ref 11.5–15.5)
WBC: 5.6 10*3/uL (ref 4.0–10.5)

## 2017-04-28 LAB — POCT I-STAT 4, (NA,K, GLUC, HGB,HCT)
Glucose, Bld: 171 mg/dL — ABNORMAL HIGH (ref 65–99)
HCT: 24 % — ABNORMAL LOW (ref 36.0–46.0)
Hemoglobin: 8.2 g/dL — ABNORMAL LOW (ref 12.0–15.0)
Potassium: 3.5 mmol/L (ref 3.5–5.1)
Sodium: 137 mmol/L (ref 135–145)

## 2017-04-28 LAB — SURGICAL PCR SCREEN
MRSA, PCR: NEGATIVE
Staphylococcus aureus: POSITIVE — AB

## 2017-04-28 LAB — PROTIME-INR
INR: 1.05
Prothrombin Time: 13.7 seconds (ref 11.4–15.2)

## 2017-04-28 SURGERY — LIGATION OF ARTERIOVENOUS  FISTULA
Anesthesia: Monitor Anesthesia Care | Site: Neck | Laterality: Right

## 2017-04-28 MED ORDER — DEXTROSE 5 % IV SOLN
INTRAVENOUS | Status: DC | PRN
  Administered 2017-04-28: 20 ug/min via INTRAVENOUS

## 2017-04-28 MED ORDER — FENTANYL CITRATE (PF) 250 MCG/5ML IJ SOLN
INTRAMUSCULAR | Status: AC
Start: 2017-04-28 — End: ?
  Filled 2017-04-28: qty 5

## 2017-04-28 MED ORDER — ONDANSETRON HCL 4 MG/2ML IJ SOLN
INTRAMUSCULAR | Status: AC
  Filled 2017-04-28: qty 2

## 2017-04-28 MED ORDER — ONDANSETRON HCL 4 MG/2ML IJ SOLN: 4.0000 mg | Freq: Once | INTRAMUSCULAR | Status: DC | PRN

## 2017-04-28 MED ORDER — CHLORHEXIDINE GLUCONATE CLOTH 2 % EX PADS: 6.0000 | MEDICATED_PAD | Freq: Every day | CUTANEOUS | Status: DC

## 2017-04-28 MED ORDER — PHENYLEPHRINE 40 MCG/ML (10ML) SYRINGE FOR IV PUSH (FOR BLOOD PRESSURE SUPPORT)
PREFILLED_SYRINGE | INTRAVENOUS | Status: AC
  Filled 2017-04-28: qty 10

## 2017-04-28 MED ORDER — 0.9 % SODIUM CHLORIDE (POUR BTL) OPTIME
TOPICAL | Status: DC | PRN
  Administered 2017-04-28: 1000 mL

## 2017-04-28 MED ORDER — SODIUM CHLORIDE 0.9 % IV SOLN
INTRAVENOUS | Status: DC | PRN
  Administered 2017-04-28: 07:00:00

## 2017-04-28 MED ORDER — FENTANYL CITRATE (PF) 100 MCG/2ML IJ SOLN
25.0000 ug | INTRAMUSCULAR | Status: DC | PRN
  Administered 2017-04-28: 25 ug via INTRAVENOUS

## 2017-04-28 MED ORDER — FENTANYL CITRATE (PF) 100 MCG/2ML IJ SOLN
INTRAMUSCULAR | Status: DC | PRN
  Administered 2017-04-28 (×2): 25 ug via INTRAVENOUS

## 2017-04-28 MED ORDER — HEPARIN SODIUM (PORCINE) 1000 UNIT/ML IJ SOLN
INTRAMUSCULAR | Status: DC | PRN
  Administered 2017-04-28: 1000 [IU]

## 2017-04-28 MED ORDER — LIDOCAINE HCL (PF) 1 % IJ SOLN
INTRAMUSCULAR | Status: DC | PRN
  Administered 2017-04-28: 40 mL

## 2017-04-28 MED ORDER — PROPOFOL 500 MG/50ML IV EMUL
INTRAVENOUS | Status: DC | PRN
Start: 2017-04-28 — End: 2017-04-28
  Administered 2017-04-28: 60 ug/kg/min via INTRAVENOUS

## 2017-04-28 MED ORDER — MUPIROCIN 2 % EX OINT
1.0000 "application " | TOPICAL_OINTMENT | Freq: Two times a day (BID) | CUTANEOUS | Status: DC
  Administered 2017-04-28: 1 via NASAL
  Filled 2017-04-28: qty 22

## 2017-04-28 MED ORDER — PROPOFOL 10 MG/ML IV BOLUS
INTRAVENOUS | Status: AC
  Filled 2017-04-28: qty 20

## 2017-04-28 MED ORDER — PROPOFOL 10 MG/ML IV BOLUS
INTRAVENOUS | Status: DC | PRN
  Administered 2017-04-28: 20 mg via INTRAVENOUS

## 2017-04-28 MED ORDER — FENTANYL CITRATE (PF) 100 MCG/2ML IJ SOLN
INTRAMUSCULAR | Status: AC
  Filled 2017-04-28: qty 2

## 2017-04-28 MED ORDER — SODIUM CHLORIDE 0.9 % IV SOLN
INTRAVENOUS | Status: DC
  Administered 2017-04-28 (×2): via INTRAVENOUS

## 2017-04-28 SURGICAL SUPPLY — 51 items
BAG DECANTER FOR FLEXI CONT (MISCELLANEOUS) ×4 IMPLANT
BIOPATCH RED 1 DISK 7.0 (GAUZE/BANDAGES/DRESSINGS) ×3 IMPLANT
BIOPATCH RED 1IN DISK 7.0MM (GAUZE/BANDAGES/DRESSINGS) ×1
CANISTER SUCT 3000ML PPV (MISCELLANEOUS) ×4 IMPLANT
CATH PALINDROME RT-P 15FX19CM (CATHETERS) IMPLANT
CATH PALINDROME RT-P 15FX23CM (CATHETERS) ×4 IMPLANT
CATH PALINDROME RT-P 15FX28CM (CATHETERS) IMPLANT
CATH PALINDROME RT-P 15FX55CM (CATHETERS) IMPLANT
CATH STRAIGHT 5FR 65CM (CATHETERS) IMPLANT
CHLORAPREP W/TINT 26ML (MISCELLANEOUS) ×4 IMPLANT
COVER PROBE W GEL 5X96 (DRAPES) ×4 IMPLANT
DECANTER SPIKE VIAL GLASS SM (MISCELLANEOUS) ×4 IMPLANT
DERMABOND ADVANCED (GAUZE/BANDAGES/DRESSINGS) ×2
DERMABOND ADVANCED .7 DNX12 (GAUZE/BANDAGES/DRESSINGS) ×2 IMPLANT
DRAPE C-ARM 42X72 X-RAY (DRAPES) ×4 IMPLANT
DRAPE CHEST BREAST 15X10 FENES (DRAPES) ×4 IMPLANT
DRSG TEGADERM 4X4.75 (GAUZE/BANDAGES/DRESSINGS) ×4 IMPLANT
ELECT REM PT RETURN 9FT ADLT (ELECTROSURGICAL) ×4
ELECTRODE REM PT RTRN 9FT ADLT (ELECTROSURGICAL) ×2 IMPLANT
GAUZE SPONGE 2X2 8PLY STRL LF (GAUZE/BANDAGES/DRESSINGS) ×2 IMPLANT
GAUZE SPONGE 4X4 16PLY XRAY LF (GAUZE/BANDAGES/DRESSINGS) ×4 IMPLANT
GLOVE BIO SURGEON STRL SZ7.5 (GLOVE) ×8 IMPLANT
GLOVE BIOGEL PI IND STRL 7.0 (GLOVE) ×6 IMPLANT
GLOVE BIOGEL PI IND STRL 7.5 (GLOVE) ×2 IMPLANT
GLOVE BIOGEL PI INDICATOR 7.0 (GLOVE) ×6
GLOVE BIOGEL PI INDICATOR 7.5 (GLOVE) ×2
GLOVE SURG SS PI 6.5 STRL IVOR (GLOVE) ×8 IMPLANT
GOWN STRL REUS W/ TWL LRG LVL3 (GOWN DISPOSABLE) ×10 IMPLANT
GOWN STRL REUS W/TWL LRG LVL3 (GOWN DISPOSABLE) ×10
HEMOSTAT SPONGE AVITENE ULTRA (HEMOSTASIS) IMPLANT
KIT BASIN OR (CUSTOM PROCEDURE TRAY) ×4 IMPLANT
KIT ROOM TURNOVER OR (KITS) ×4 IMPLANT
NEEDLE 18GX1X1/2 (RX/OR ONLY) (NEEDLE) ×4 IMPLANT
NS IRRIG 1000ML POUR BTL (IV SOLUTION) ×4 IMPLANT
PACK CV ACCESS (CUSTOM PROCEDURE TRAY) ×4 IMPLANT
PAD ARMBOARD 7.5X6 YLW CONV (MISCELLANEOUS) ×8 IMPLANT
SET MICROPUNCTURE 5F STIFF (MISCELLANEOUS) IMPLANT
SPONGE GAUZE 2X2 STER 10/PKG (GAUZE/BANDAGES/DRESSINGS) ×2
SUT ETHILON 3 0 PS 1 (SUTURE) ×4 IMPLANT
SUT PROLENE 6 0 CC (SUTURE) IMPLANT
SUT SILK 0 (SUTURE) ×4 IMPLANT
SUT VIC AB 3-0 SH 27 (SUTURE) ×2
SUT VIC AB 3-0 SH 27X BRD (SUTURE) ×2 IMPLANT
SUT VICRYL 4-0 PS2 18IN ABS (SUTURE) ×4 IMPLANT
SYR 10ML LL (SYRINGE) ×4 IMPLANT
SYR 20CC LL (SYRINGE) ×4 IMPLANT
SYR 5ML LL (SYRINGE) ×4 IMPLANT
TOWEL OR 17X24 6PK STRL BLUE (TOWEL DISPOSABLE) ×4 IMPLANT
UNDERPAD 30X30 (UNDERPADS AND DIAPERS) ×4 IMPLANT
WATER STERILE IRR 1000ML POUR (IV SOLUTION) ×4 IMPLANT
WIRE AMPLATZ SS-J .035X180CM (WIRE) IMPLANT

## 2017-04-28 NOTE — Progress Notes (Signed)
Dialysis cath in good position Ok to d/c home from my standpoint  Ruta Hinds, MD [facility]: [phone] Pager: [phone]

## 2017-04-28 NOTE — Anesthesia Postprocedure Evaluation (Signed)
Anesthesia Post Note  Patient: Jody Nguyen  Procedure(s) Performed: Procedure(s) (LRB): LIGATION OF ARTERIOVENOUS  FISTULA (Left) INSERTION OF DIALYSIS CATHETER-RIGHT INTERNAL JUGULAR PLACEMENT (Right)     Patient location during evaluation: PACU Anesthesia Type: MAC Level of consciousness: awake and alert Pain management: pain level controlled Vital Signs Assessment: post-procedure vital signs reviewed and stable Respiratory status: spontaneous breathing, nonlabored ventilation, respiratory function stable and patient connected to nasal cannula oxygen Cardiovascular status: stable and blood pressure returned to baseline Anesthetic complications: no    Last Vitals:  Vitals:   04/28/17 1020 04/28/17 1100  BP:  122/72  Pulse:  92  Resp:  16  Temp: 36.3 C 36.3 C    Last Pain:  Vitals:   04/28/17 1100  TempSrc: Oral  PainSc:                  [name]

## 2017-04-28 NOTE — Discharge Instructions (Signed)
AV Fistula Placement  Arteriovenous (AV) fistula placement is a surgical procedure to create a connection between a blood vessel that carries blood away from your heart (artery) and a blood vessel that returns blood to your heart (vein). The connection is called a fistula. It is often made in the forearm or upper arm.  You may need this procedure if you are getting hemodialysis treatments for kidney disease. An AV fistula makes your vein larger and stronger over several months. This makes the vein a safe and easy spot to insert the needles that are used for hemodialysis.  Tell a health care provider about:  · Any allergies you have.  · All medicines you are taking, including vitamins, herbs, eye drops, creams, and over-the-counter medicines.  · Any problems you or family members have had with anesthetic medicines.  · Any blood disorders you have.  · Any surgeries you have had.  · Any medical conditions you have.  What are the risks?  Generally, this is a safe procedure. However, problems may occur, including:  · Infection.  · Blood clot (thrombosis).  · Reduced blood flow (stenosis).  · Weakening or ballooning out of the fistula (aneurysm).  · Bleeding.  · Allergic reactions to medicines.  · Nerve damage.  · Swelling near the fistula (lymphedema).  · Weakening of your heart (congestive heart failure).  · Failure of the procedure.    What happens before the procedure?  · Imaging tests of your arm may be done to find the best place for the fistula.  · Ask your health care provider about:  ? Changing or stopping your regular medicines. This is especially important if you are taking diabetes medicines or blood thinners.  ? Taking medicines such as aspirin and ibuprofen. These medicines can thin your blood. Do not take these medicines before your procedure if your health care provider instructs you not to.  · Follow instructions from your health care provider about eating or drinking restrictions.  · You may be given  antibiotic medicine to help prevent infection.  · Ask your health care provider how your surgical site will be marked or identified.  · Plan to have someone take you home after the procedure.  What happens during the procedure?  · To reduce your risk of infection:  ? Your health care team will wash or sanitize their hands.  ? Your skin will be washed with soap.  ? Hair may be removed from the surgical area.  · An IV tube will be started in one of your veins.  · You will be given one or more of the following:  ? A medicine to help you relax (sedative).  ? A medicine to numb the area (local anesthetic).  ? A medicine to make you fall asleep (general anesthetic).  ? A medicine that is injected into an area of your body to numb everything below the injection site (regional anesthetic).  · The fistula site will be cleaned with a germ-killing solution (antiseptic).  · A cut (incision) will be made on the inner side of your arm.  · A vein and an artery will be opened and connected with stitches (sutures).  · The incision will be closed with sutures or clips.  · A bandage (dressing) will be placed over the area.  The procedure may vary among health care providers and hospitals.  What happens after the procedure?  · Your blood pressure, heart rate, breathing rate, and blood oxygen level will be   monitored often until the medicines you were given have worn off.  · Your fistula site will be checked for bleeding or swelling.  · You will be given pain medication as needed.  · Do not drive for 24 hours if you received a sedative.  This information is not intended to replace advice given to you by your health care provider. Make sure you discuss any questions you have with your health care provider.  Document Released: 10/25/2015 Document Revised: 04/20/2016 Document Reviewed: 02/03/2015  Elsevier Interactive Patient Education © 2017 [employer]

## 2017-04-28 NOTE — Interval H&P Note (Signed)
History and Physical Interval Note:  04/28/2017 7:32 AM  Jody Nguyen  has presented today for surgery, with the diagnosis of End Stage Renal Disease   N18.6 Bleeding from left arm arteriovenous fistula site  T82.838D  The various methods of treatment have been discussed with the patient and family. After consideration of risks, benefits and other options for treatment, the patient has consented to  Procedure(s): LIGATION OF ARTERIOVENOUS  FISTULA (Left) INSERTION OF DIALYSIS CATHETER (N/A) as a surgical intervention .  The patient's history has been reviewed, patient examined, no change in status, stable for surgery.  I have reviewed the patient's chart and labs.  Questions were answered to the patient's satisfaction.     Ruta Hinds

## 2017-04-28 NOTE — Discharge Summary (Signed)
Physician Discharge Summary  Jody Nguyen:[id number] DOB: [birth year] DOA: 04/27/2017  PCP: Unk Pinto, MD  Admit date: 04/27/2017 Discharge date: 04/28/2017  Admitted From: Home Discharge disposition: Home   Recommendations for Outpatient Follow-Up:   1. Patient will follow-up with nephrologist at her HD treatment 04/30/17. 2. Follow-up scheduled with Dr. Bridgett Larsson 05/01/17.   Discharge Diagnosis:   Principal Problem:   Bleeding pseudoaneurysm of left brachiocephalic AV fistula (HCC) Active Problems:    Hypertension   Anemia in chronic kidney disease   Insulin dependent diabetes with renal manifestation (HCC)   ESRD on dialysis ([place])   Problem with dialysis access [facility])   Discharge Condition: Improved.  Diet recommendation: Low sodium, heart healthy.  Carbohydrate-modified.    History of Present Illness:   Jody Nguyen is a 61 year old female with a PMH of ESRD who was admitted with a left radiocephalic AV fistula bleed. She had a previous bleed proximally 10 days prior to admission, but refused ligation at that time. She is scheduled to have a right AV fistula by Dr. Bridgett Larsson in the near future.  Hospital Course by Problem:   Principal Problem:   Bleeding pseudoaneurysm of left brachiocephalic AV fistula (HCC) Vascular surgery consulted and underwent ligation 06/29/14 with no complications. Cleared for discharge by vascular surgeon postoperatively.  Active Problems:   Hypertension Cozaar held on admission. Instructed to resume this 04/29/17.    Anemia in chronic kidney disease Hemoglobin stable at discharge.    Insulin dependent diabetes with renal manifestation [facility]) Resume outpatient regimen.    ESRD on dialysis (HCC)/metabolic bone disease Scheduled to receive dialysis 04/30/17.    Problem with dialysis access [facility]) Scheduled to have a right AV fistula placed by Dr. Bridgett Larsson next week.   Medical Consultants:    Vascular Surgery   Discharge Exam:   Vitals:   04/28/17 1020 04/28/17 1100  BP:  122/72  Pulse:  92  Resp:  16  Temp: 97.3 F (36.3 C) 97.4 F (36.3 C)   Vitals:   04/28/17 1002 04/28/17 1017 04/28/17 1020 04/28/17 1100  BP: 123/74 118/81  122/72  Pulse: 89 90  92  Resp: 14 13  16   Temp:   97.3 F (36.3 C) 97.4 F (36.3 C)  TempSrc:    Oral  SpO2: (!) 89% 99%  95%  Weight:      Height:        General exam: No acute distress.  Respiratory system: Clear to auscultation. No rales, rhonchi, or wheezes.  Cardiovascular system: Regular rate, and rhythm. No murmurs, rubs, or gallops.  Gastrointestinal system: Soft, nontender, nondistended with normal active bowel sounds. No masses. Central nervous system: Nonfocal, alert and oriented. Extremities: Left AVF with small dressing that is clean, dry and intact. Skin: No rashes, lesions, or ulcers. Psychiatry: Insight and judgment normal, affect bright.   The results of significant diagnostics from this hospitalization (including imaging, microbiology, ancillary and laboratory) are listed below for reference.     Procedures and Diagnostic Studies:   Dg Chest Port 1 View  Result Date: 04/28/2017 CLINICAL DATA:  Dialysis catheter insertion EXAM: PORTABLE CHEST 1 VIEW COMPARISON:  07/10/2016 FINDINGS: Right IJ dialysis catheter tip at the SVC RA junction. No pneumothorax. Stable cardiomegaly. Remote mitral valve replacement. Postop changes right hilum. Volume loss in the right hemithorax with elevation of the right hemidiaphragm. Central vascular congestion and basilar atelectasis. Trachea is midline. Diffuse thoracic spondylosis. IMPRESSION: New right IJ dialysis catheter tip SVC RA junction. Negative for  pneumothorax or complicating feature. Stable postoperative findings. Cardiomegaly with mild central vascular congestion and basilar atelectasis. Electronically Signed   By: Jerilynn Mages.  Shick M.D.   On: 04/28/2017 09:36   Dg Fluoro Guide Cv Line-no Report  Result Date: 04/28/2017 Fluoroscopy  was utilized by the requesting physician.  No radiographic interpretation.     Labs:   Basic Metabolic Panel:  Recent Labs Lab 04/26/17 1552 04/27/17 1930 04/28/17 0554 04/28/17 0701  NA 136 134* 131* 137  K 3.2* 3.4* 3.1* 3.5  CL 97* 92* 91*  --   CO2 26 30 26   --   GLUCOSE 228* 171* 155* 171*  BUN 33* 24* 30*  --   CREATININE 7.65* 5.52* 6.87*  --   CALCIUM 8.9 9.8 8.9  --    GFR Estimated Creatinine Clearance: 8.4 mL/min (A) (by C-G formula based on SCr of 6.87 mg/dL (H)). Coagulation profile  Recent Labs Lab 04/27/17 1930 04/28/17 0554  INR 1.04 1.05    CBC:  Recent Labs Lab 04/26/17 1552 04/27/17 1930 04/28/17 0554 04/28/17 0701  WBC 7.9 6.0 5.6  --   NEUTROABS 5.8 3.4  --   --   HGB 9.1* 8.2* 7.3* 8.2*  HCT 29.5* 26.6* 23.4* 24.0*  MCV 89.7 89.0 87.6  --   PLT 218 240 186  --    CBG:  Recent Labs Lab 04/27/17 2341 04/28/17 0413 04/28/17 0608 04/28/17 0919 04/28/17 1136  GLUCAP 237* 144* 168* 157* 133*   Microbiology Recent Results (from the past 240 hour(s))  Surgical pcr screen     Status: Abnormal   Collection Time: 04/27/17 10:26 PM  Result Value Ref Range Status   MRSA, PCR NEGATIVE NEGATIVE Final   Staphylococcus aureus POSITIVE (A) NEGATIVE Final    Comment:        The Xpert SA Assay (FDA approved for NASAL specimens in patients over 66 years of age), is one component of a comprehensive surveillance program.  Test performance has been validated by [facility] for patients greater than or equal to 36 year old. It is not intended to diagnose infection nor to guide or monitor treatment.      Discharge Instructions:   Discharge Instructions    Call MD for:  extreme fatigue    Complete by:  As directed    Call MD for:  persistant dizziness or light-headedness    Complete by:  As directed    Call MD for:  redness, tenderness, or signs of infection (pain, swelling, redness, odor or green/yellow discharge around incision  site)    Complete by:  As directed    Call MD for:  severe uncontrolled pain    Complete by:  As directed    Call MD for:  temperature >100.4    Complete by:  As directed    Diet - low sodium heart healthy    Complete by:  As directed    Diet general    Complete by:  As directed    Increase activity slowly    Complete by:  As directed      Allergies as of 04/28/2017      Reactions   Minoxidil Other (See Comments)   Pericardial effusion   Lipitor [atorvastatin] Other (See Comments)   REACTION: "pain in legs" Tolerates rosuvastatin   Ace Inhibitors Other (See Comments)   REACTION: "not sure...think it made me drowsy all the time"      Medication List    STOP taking these medications  aspirin EC 81 MG tablet     TAKE these medications   busPIRone 10 MG tablet Commonly known as:  BUSPAR Take 1 tablet (10 mg total) by mouth 2 (two) times daily.   carvedilol 6.25 MG tablet Commonly known as:  COREG Take 1 tablet (6.25 mg total) by mouth 2 (two) times daily.   insulin aspart protamine - aspart (70-30) 100 UNIT/ML FlexPen Commonly known as:  NOVOLOG MIX 70/30 FLEXPEN Inject 0.1 mLs (10 Units total) into the skin 2 (two) times daily.   losartan 50 MG tablet Commonly known as:  COZAAR Take 1 tablet (50 mg total) by mouth daily.   RENVELA 800 MG tablet Generic drug:  sevelamer carbonate Take 1,600-4,000 mg by mouth 3 (three) times daily. 4000 mg (5 caps) with meals and 1600 mg (2 caps) with snacks   rosuvastatin 20 MG tablet Commonly known as:  CRESTOR Take 1 tablet (20 mg total) by mouth daily.   SENSIPAR 30 MG tablet Generic drug:  cinacalcet Take 30 mg by mouth daily.   sertraline 25 MG tablet Commonly known as:  ZOLOFT Take 1 tablet (25 mg total) by mouth daily.      Follow-up Information    Rexene Agent, MD. Go on 04/30/2017.   Specialty:  Nephrology Why:  For dialysis Contact information: [place] [address] [phone]         Conrad [place], MD. Go on 05/01/2017.   Specialties:  Vascular Surgery, Cardiology Contact information: [place]  [phone]            Time coordinating discharge: 25 minutes.  Signed:  Raea Magallon  Pager [phone] [facility] 04/28/2017, 12:53 PM

## 2017-04-28 NOTE — Op Note (Addendum)
Procedure: Ultrasound-guided insertion of Palindrome catheter right internal jugular vein 23 cm, Ligation left radial cephalic AV fistula  Preoperative diagnosis: End-stage renal disease  Postoperative diagnosis: Same  Anesthesia: Local with IV sedation  Operative findings: 23 cm Diatek catheter right internal jugular vein  Operative details: After obtaining informed consent, the patient was taken to the operating room. The patient was placed in supine position on the operating room table. After adequate sedation the patient's entire neck and chest were prepped and draped in usual sterile fashion. The patient was placed in Trendelenburg position. Ultrasound was used to identify the patient's right internal jugular vein. This had normal compressibility and respiratory variation. Local anesthesia was infiltrated over the right jugular vein.  Using ultrasound guidance, the right internal jugular vein was successfully cannulated.  A 0.035 J-tipped guidewire was threaded into the right internal jugular vein and into the superior vena cava followed by the inferior vena cava under fluoroscopic guidance.   Next sequential 12 and 14 dilators were placed over the guidewire into the right atrium.  A 16 French dilator with a peel-away sheath was then placed over the guidewire into the right atrium.   The guidewire and dilator were removed. A 23 cm Palindrome catheter was then placed through the peel away sheath into the right atrium.  The catheter was then tunneled subcutaneously, cut to length, and the hub attached. The catheter was noted to flush and draw easily. The catheter was inspected under fluoroscopy and found with its tip to be in the right atrium without any kinks throughout its course. The catheter was sutured to the skin with nylon sutures. The neck insertion site was closed with Vicryl stitch. The catheter was then loaded with concentrated Heparin solution. A dry sterile dressing was applied.  Next the  entire left upper extremity was prepped and draped in sterile fashion.  Local anesthesia was infiltrated at a preexisting scar in the left wrist.  An incision was made in this location and carried down to the proximal aspect of the fistula.  It was dissected free circumferentially and ligated with two separate 2 0 silk ties.  The subcutaneous tissues were reapproximated with a running 3 0 vicryl stitch.  The skin was closed with a 4 0 vicryl subcuticular stitch.  Dermabond was applied.  The patient tolerated procedure well and there were no complications. Instrument sponge and needle counts correct in the case. The patient was taken to the recovery room in stable condition. Chest x-ray will be obtained in the recovery room.  Ruta Hinds, MD [facility] Office: [phone] Pager: [phone]

## 2017-04-28 NOTE — Progress Notes (Signed)
Pt in PACU post dialysis catheter and ligation of fistula.  If chest xray shows line ok she can be dc'd home from my standpoint  Ruta Hinds, MD [facility] [place]: [phone] Pager: [phone]

## 2017-04-28 NOTE — H&P (View-Only) (Signed)
Referring Physician: Otelia Santee, MD, nephrology service  Patient name: Jody Nguyen MRN: [id number] DOB: 03/15/1956 Sex: female  REASON FOR CONSULT: bleeding AVFistula  HPI: Jody Nguyen is a 61 y.o. female with 2 prior bleeding episodes left radial cephalic AV fistula.  Last episode was yesterday and was seen in ER.  She was previously offered ligation of this fistula about 10 days ago but refused.  She is scheduled for placement of a right arm AV fistula by Dr Bridgett Larsson next week.  She is MWF dialysis.  Past Medical History:  Diagnosis Date  . Anemia   . Chronic diastolic CHF (congestive heart failure) (HCC)    takes Furosemide daily  . Chronic diastolic heart failure ([place])   . CKD (chronic kidney disease) stage 4, GFR 15-29 ml/min (HCC)   . Constipation   . CVA (cerebral infarction) 1997   no residual deficit  . Diverticulitis   . DM (diabetes mellitus) ([place])    takes Novolog and Metformin daily  . Heart murmur   . History of cardiovascular stress test    a. Myoview Oct 2012 showed EF 49%, no ischemia, LVE  . HLD (hyperlipidemia)    takes Crestor daily  . Hypertension    takes Hydralazine,Losartan,and Labetalol daily  . Pericardial effusion    chronic; felt to be poss related to minoxidil >> DC'd  . Pulmonary hypertension ([place]) 06/18/2015  . Respiratory failure, acute hypoxic, post-operative 07/08/2015   Requiring ECMO support  . S/P cardiac catheterization    a. R/L HC 06/18/15:  mLAD 30%; severe pulmo HTN with PA sat 43%, CI 1.86, prominent V waves indicative of MR; resting hypoxemia O2 sat 86% on RA  . S/P minimally invasive mitral valve repair 07/08/2015   Complex valvuloplasty including artificial Gore-tex neochord placement x10 and 26 mm Sorin Memo 3D Rechord ring annuloplasty via right mini thoracotomy approach  . Severe mitral regurgitation   . Shortness of breath dyspnea   . Stroke ([place]) 1997   no residual effect  . Vitamin D deficiency   . Wears glasses     Past Surgical History:  Procedure Laterality Date  . AV FISTULA PLACEMENT Left 10/22/2015   Procedure: RADIOCEPHALIC ARTERIOVENOUS (AV) FISTULA CREATION;  Surgeon: Serafina Mitchell, MD;  Location: [place];  Service: Vascular;  Laterality: Left;  . CANNULATION FOR CARDIOPULMONARY BYPASS N/A 07/08/2015   Procedure: CANNULATION FOR ECMO;  Surgeon: Rexene Alberts, MD;  Location: [place];  Service: Open Heart Surgery;  Laterality: N/A;  . CARDIAC CATHETERIZATION    . CARDIAC CATHETERIZATION N/A 06/18/2015   Procedure: Right/Left Heart Cath and Coronary Angiography;  Surgeon: Jettie Booze, MD;  Location: [place] [facility];  Service: Cardiovascular;  Laterality: N/A;  . CESAREAN SECTION     x 2  . COLONOSCOPY    . FISTULA SUPERFICIALIZATION Left 12/28/2015   Procedure: SUPERFICIALIZATION LEFT RADIOCEPHALIC FISTULA;  Surgeon: Serafina Mitchell, MD;  Location: [place];  Service: Vascular;  Laterality: Left;  . MITRAL VALVE REPAIR Right 07/08/2015   Procedure: MINIMALLY INVASIVE MITRAL VALVE REPAIR with a 26 Sorin Memo 3D Rechord;  Surgeon: Rexene Alberts, MD;  Location: [place];  Service: Open Heart Surgery;  Laterality: Right;  . MULTIPLE EXTRACTIONS WITH ALVEOLOPLASTY N/A 06/30/2015   Procedure: MULTIPLE EXTRACTIONS OF TOOTH #'S 4  AND 30 WITH ALVEOLOPLASTY AND GROSS DEBRIDEMENT  OF REMAINING TEETH;  Surgeon: Lenn Cal, DDS;  Location: [place];  Service: Oral Surgery;  Laterality:  N/A;  . PERIPHERAL VASCULAR CATHETERIZATION Left 03/28/2016   Procedure: A/V Fistulagram;  Surgeon: Serafina Mitchell, MD;  Location: [place] [facility];  Service: Cardiovascular;  Laterality: Left;  lower arm  . TEE WITHOUT CARDIOVERSION N/A 06/08/2015   Procedure: TRANSESOPHAGEAL ECHOCARDIOGRAM (TEE);  Surgeon: Lelon Perla, MD;  Location[facility];  Service: Cardiovascular;  Laterality: N/A;  . TEE WITHOUT CARDIOVERSION N/A 07/08/2015   Procedure: TRANSESOPHAGEAL ECHOCARDIOGRAM (TEE);  Surgeon: Rexene Alberts, MD;   Location: [place];  Service: Open Heart Surgery;  Laterality: N/A;    Family History  Problem Relation Age of Onset  . Cancer Sister        breast cancer  . Stroke Mother   . Heart attack Brother        MI in his 78s  . Heart disease Brother        before age 55  . Breast cancer Sister     SOCIAL HISTORY: Social History   Social History  . Marital status: Married    Spouse name: N/A  . Number of children: 3  . Years of education: N/A   Occupational History  .      Unemployed; used to work as a Radio broadcast assistant   Social History Main Topics  . Smoking status: Never Smoker  . Smokeless tobacco: Never Used  . Alcohol use No  . Drug use: No  . Sexual activity: Not on file   Other Topics Concern  . Not on file   Social History Narrative  . No narrative on file    Allergies  Allergen Reactions  . Minoxidil Other (See Comments)    Pericardial effusion  . Lipitor [Atorvastatin] Other (See Comments)    REACTION: "pain in legs" Tolerates rosuvastatin  . Ace Inhibitors Other (See Comments)    REACTION: "not sure...think it made me drowsy all the time"    Current Facility-Administered Medications  Medication Dose Route Frequency Provider Last Rate Last Dose  . [START ON 04/28/2017] ceFAZolin (ANCEF) IVPB 2g/100 mL premix  2 g Intravenous On Call Gabriel Earing, PA-C       Current Outpatient Prescriptions  Medication Sig Dispense Refill  . aspirin EC 81 MG tablet Take 81 mg by mouth daily.    . busPIRone (BUSPAR) 10 MG tablet Take 1 tablet (10 mg total) by mouth 2 (two) times daily. 180 tablet 1  . carvedilol (COREG) 6.25 MG tablet Take 1 tablet (6.25 mg total) by mouth 2 (two) times daily. 180 tablet 1  . insulin aspart protamine - aspart (NOVOLOG MIX 70/30 FLEXPEN) (70-30) 100 UNIT/ML FlexPen Inject 0.1 mLs (10 Units total) into the skin 2 (two) times daily. (Patient not taking: Reported on 04/22/2017) 15 mL 3  . losartan (COZAAR) 50 MG tablet Take 1 tablet  (50 mg total) by mouth daily. 90 tablet 1  . RENVELA 800 MG tablet Take 1,600-4,000 mg by mouth 3 (three) times daily. 4000 mg (5 caps) with meals and 1600 mg (2 caps) with snacks  5  . rosuvastatin (CRESTOR) 20 MG tablet Take 1 tablet (20 mg total) by mouth daily. 90 tablet 1  . SENSIPAR 30 MG tablet Take 30 mg by mouth daily.     . sertraline (ZOLOFT) 25 MG tablet Take 1 tablet (25 mg total) by mouth daily. 90 tablet 1    ROS:   General:  No weight loss, Fever, chills  Pulmonary: No home oxygen, no productive cough, no hemoptysis,  No  asthma or wheezing  Skin: No rashes   Physical Examination  Vitals:   04/27/17 1252 04/27/17 1537 04/27/17 1754 04/27/17 1757  BP: 92/66 97/64 107/61   Pulse: (!) 122 (!) 101 (!) 103   Resp: (!) 24 16 16    Temp: 98.4 F (36.9 C)  98.1 F (36.7 C)   TempSrc: Oral  Oral   SpO2: 94% 96% 96%   Weight:    197 lb 11.2 oz (89.7 kg)  Height:    5' (1.524 m)    Body mass index is 38.61 kg/m.  General:  Alert and oriented, no acute distress HEENT: Normal Skin: No rash, left forearm AVF with dressing not currently bleeding  DATA:  CBC    Component Value Date/Time   WBC 7.9 04/26/2017 1552   RBC 3.29 (L) 04/26/2017 1552   HGB 9.1 (L) 04/26/2017 1552   HGB 12.1 03/03/2013 0955   HCT 29.5 (L) 04/26/2017 1552   HCT 37.4 03/03/2013 0955   PLT 218 04/26/2017 1552   PLT 288 03/03/2013 0955   MCV 89.7 04/26/2017 1552   MCV 77.9 (L) 03/03/2013 0955   MCH 27.7 04/26/2017 1552   MCHC 30.8 04/26/2017 1552   RDW 18.7 (H) 04/26/2017 1552   RDW 16.1 (H) 03/03/2013 0955   LYMPHSABS 1.4 04/26/2017 1552   LYMPHSABS 1.8 03/03/2013 0955   MONOABS 0.5 04/26/2017 1552   MONOABS 0.4 03/03/2013 0955   EOSABS 0.2 04/26/2017 1552   EOSABS 0.3 03/03/2013 0955   BASOSABS 0.0 04/26/2017 1552   BASOSABS 0.0 03/03/2013 0955    BMET    Component Value Date/Time   NA 136 04/26/2017 1552   NA 140 03/03/2013 0955   K 3.2 (L) 04/26/2017 1552   K 3.8  03/03/2013 0955   CL 97 (L) 04/26/2017 1552   CL 99 03/03/2013 0955   CO2 26 04/26/2017 1552   CO2 29 03/03/2013 0955   GLUCOSE 228 (H) 04/26/2017 1552   GLUCOSE 210 (H) 03/03/2013 0955   BUN 33 (H) 04/26/2017 1552   BUN 28.1 (H) 03/03/2013 0955   CREATININE 7.65 (H) 04/26/2017 1552   CREATININE 6.70 (H) 03/06/2017 0943   CREATININE 1.9 (H) 03/03/2013 0955   CALCIUM 8.9 04/26/2017 1552   CALCIUM 9.9 03/03/2013 0955   GFRNONAA 5 (L) 04/26/2017 1552   GFRNONAA 6 (L) 03/06/2017 0943   GFRAA 6 (L) 04/26/2017 1552   GFRAA 7 (L) 03/06/2017 3086     ASSESSMENT:  Ulcerated left arm AV fistula at high risk for bleeding   PLAN:  1. NPO p midnight.  2. Ligate left arm AVF and place dialysis catheter tomorrow.  3. Hospitalist admission due to her multiple host of medical problems  She should be able to d/c home after operation tomorrow if no other problems and return Tuesday for new fistula.   Ruta Hinds, MD [facility] Office: [phone] Pager: [phone]

## 2017-04-28 NOTE — Discharge Summary (Signed)
Pt given discharge instructions, follow up info, and instructions for surgery on Tuesday. Husband to drive pt home. Denies questions.

## 2017-04-28 NOTE — Transfer of Care (Signed)
Immediate Anesthesia Transfer of Care Note  Patient: Jody Nguyen  Procedure(s) Performed: Procedure(s): LIGATION OF ARTERIOVENOUS  FISTULA (Left) INSERTION OF DIALYSIS CATHETER-RIGHT INTERNAL JUGULAR PLACEMENT (Right)  Patient Location: PACU  Anesthesia Type:MAC  Level of Consciousness: awake, alert  and oriented  Airway & Oxygen Therapy: Patient Spontanous Breathing  Post-op Assessment: Report given to RN, Post -op Vital signs reviewed and stable and Patient moving all extremities X 4  Post vital signs: Reviewed and stable  Last Vitals:  Vitals:   04/27/17 2135 04/28/17 0410  BP: (!) 113/52 107/60  Pulse: (!) 105 98  Resp: 16 18  Temp: 37.1 C 36.9 C    Last Pain:  Vitals:   04/28/17 0410  TempSrc: Oral  PainSc:          Complications: No apparent anesthesia complications

## 2017-04-28 NOTE — Anesthesia Preprocedure Evaluation (Addendum)
Anesthesia Evaluation  Patient identified by MRN, date of birth, ID band Patient awake    Reviewed: Allergy & Precautions, NPO status , Patient's Chart, lab work & pertinent test results, reviewed documented beta blocker date and time   Airway Mallampati: II  TM Distance: >3 FB Neck ROM: Full    Dental  (+) Teeth Intact, Dental Advisory Given   Pulmonary  PHTN   Pulmonary exam normal breath sounds clear to auscultation       Cardiovascular hypertension, Pt. on home beta blockers and Pt. on medications +CHF  Normal cardiovascular exam+ dysrhythmias Atrial Fibrillation + Valvular Problems/Murmurs (s/p MVR) MR  Rhythm:Regular Rate:Normal     Neuro/Psych PSYCHIATRIC DISORDERS Depression CVA, No Residual Symptoms negative neurological ROS     GI/Hepatic negative GI ROS, Neg liver ROS,   Endo/Other  diabetes, Insulin DependentObesity   Renal/GU ESRF and DialysisRenal disease     Musculoskeletal negative musculoskeletal ROS (+)   Abdominal   Peds  Hematology  (+) Blood dyscrasia, anemia ,   Anesthesia Other Findings Day of surgery medications reviewed with the patient.  Reproductive/Obstetrics                             Anesthesia Physical Anesthesia Plan  ASA: III  Anesthesia Plan: MAC   Post-op Pain Management:    Induction: Intravenous  Airway Management Planned: Nasal Cannula  Additional Equipment:   Intra-op Plan:   Post-operative Plan:   Informed Consent: I have reviewed the patients History and Physical, chart, labs and discussed the procedure including the risks, benefits and alternatives for the proposed anesthesia with the patient or authorized representative who has indicated his/her understanding and acceptance.   Dental advisory given  Plan Discussed with: CRNA  Anesthesia Plan Comments: (Discussed risks/benefits/alternatives to MAC sedation including need for  ventilatory support, hypotension, need for conversion to general anesthesia.  All patient questions answered.  Patient/guardian wishes to proceed.)        Anesthesia Quick Evaluation

## 2017-04-28 NOTE — Pre-Procedure Instructions (Signed)
Jody Nguyen  04/28/2017       Your procedure is scheduled on June 5.  Report to [facility] Admitting at 7 A.M.  Call this number if you have problems the morning of surgery:  [phone]   Remember:  Do not eat food or drink liquids after midnight.  Take these medicines the morning of surgery with A SIP OF WATER: Buspirone, Carvedilol, Sensipar, Sertraline   How to Manage Your Diabetes Before and After Surgery  Why is it important to control my blood sugar before and after surgery? . Improving blood sugar levels before and after surgery helps healing and can limit problems. . A way of improving blood sugar control is eating a healthy diet by: o  Eating less sugar and carbohydrates o  Increasing activity/exercise o  Talking with your doctor about reaching your blood sugar goals . High blood sugars (greater than 180 mg/dL) can raise your risk of infections and slow your recovery, so you will need to focus on controlling your diabetes during the weeks before surgery. . Make sure that the doctor who takes care of your diabetes knows about your planned surgery including the date and location.  How do I manage my blood sugar before surgery? . Check your blood sugar at least 4 times a day, starting 2 days before surgery, to make sure that the level is not too high or low. o Check your blood sugar the morning of your surgery when you wake up and every 2 hours until you get to the Short Stay unit. . If your blood sugar is less than 70 mg/dL, you will need to treat for low blood sugar: o Do not take insulin. o Treat a low blood sugar (less than 70 mg/dL) with  cup of clear juice (cranberry or apple), 4 glucose tablets, OR glucose gel. o Recheck blood sugar in 15 minutes after treatment (to make sure it is greater than 70 mg/dL). If your blood sugar is not greater than 70 mg/dL on recheck, call [phone] for further instructions. . Report your blood sugar to the short  stay nurse when you get to Short Stay.  . If you are admitted to the hospital after surgery: o Your blood sugar will be checked by the staff and you will probably be given insulin after surgery (instead of oral diabetes medicines) to make sure you have good blood sugar levels. o The goal for blood sugar control after surgery is 80-180 mg/dL.    Do not wear jewelry, make-up or nail polish.  Do not wear lotions, powders, or perfumes, or deoderant.  Do not shave 48 hours prior to surgery.  Men may shave face and neck.  Do not bring valuables to the hospital.  [facility] is not responsible for any belongings or valuables.  Contacts, dentures or bridgework may not be worn into surgery.  Leave your suitcase in the car.  After surgery it may be brought to your room.  For patients admitted to the hospital, discharge time will be determined by your treatment team.  [facility] - Preparing for Surgery  Before surgery, you can play an important role.  Because skin is not sterile, your skin needs to be as free of germs as possible.  You can reduce the number of germs on you skin by washing with CHG (chlorahexidine gluconate) soap before surgery.  CHG is an antiseptic cleaner which kills germs and bonds with the skin to continue killing germs even  after washing.  Please DO NOT use if you have an allergy to CHG or antibacterial soaps.  If your skin becomes reddened/irritated stop using the CHG and inform your nurse when you arrive at Short Stay.  Do not shave (including legs and underarms) for at least 48 hours prior to the first CHG shower.  You may shave your face.  Please follow these instructions carefully:   1.  Shower with CHG Soap the night before surgery and the morning of Surgery.  2.  If you choose to wash your hair, wash your hair first as usual with your normal shampoo.  3.  After you shampoo, rinse your hair and body thoroughly to remove the shampoo.  4.  Use CHG as you would any other  liquid soap.  You can apply CHG directly to the skin and wash gently with scrungie or a clean washcloth.  5.  Apply the CHG Soap to your body ONLY FROM THE NECK DOWN AVOID DIALYSIS CATH.  Do not use on open wounds or open sores.  Avoid contact with your eyes, ears, mouth and genitals (private parts).  Wash genitals (private parts) with your normal soap.  6.  Wash thoroughly, paying special attention to the area where your surgery will be performed.  7.  Thoroughly rinse your body with warm water from the neck down.  8.  DO NOT shower/wash with your normal soap after using and rinsing off the CHG Soap.  9.  Pat yourself dry with a clean towel.            10.  Wear clean pajamas.            11.  Place clean sheets on your bed the night of your first shower and do not sleep with pets.  Day of Surgery  Do not apply any lotions the morning of surgery.  Please wear clean clothes to the hospital/surgery center.    Patients discharged the day of surgery will not be allowed to drive home.

## 2017-04-29 ENCOUNTER — Encounter (HOSPITAL_COMMUNITY): Payer: Self-pay | Admitting: Vascular Surgery

## 2017-04-30 ENCOUNTER — Other Ambulatory Visit: Payer: Self-pay

## 2017-05-02 ENCOUNTER — Other Ambulatory Visit: Payer: Self-pay | Admitting: Internal Medicine

## 2017-05-14 ENCOUNTER — Encounter: Payer: Self-pay | Admitting: Internal Medicine

## 2017-05-16 ENCOUNTER — Other Ambulatory Visit: Payer: Self-pay

## 2017-05-21 ENCOUNTER — Encounter (HOSPITAL_COMMUNITY): Payer: Self-pay | Admitting: *Deleted

## 2017-05-21 NOTE — Progress Notes (Signed)
Pt has hx of mitral valve replacement, denies chest pain or sob. Pt is diabetic, type 2. No longer on medications. Last A1C was 6.1 on 03/06/17. States her fasting blood sugar is usually less than 125. Instructed pt to check her blood sugar in the AM when she gets up and every 2 hours prior to leaving for the hospital. If blood sugar is 70 or below, treat with 1/2 cup of clear juice (apple or cranberry) and recheck blood sugar 15 minutes after drinking juice. If blood sugar continues to be 70 or below, call the Short Stay department and ask to speak to a nurse. Pt voiced understanding.

## 2017-05-22 ENCOUNTER — Ambulatory Visit (HOSPITAL_COMMUNITY): Payer: 59 | Admitting: Anesthesiology

## 2017-05-22 ENCOUNTER — Ambulatory Visit (HOSPITAL_COMMUNITY)
Admission: RE | Admit: 2017-05-22 | Discharge: 2017-05-22 | Disposition: A | Discharge status: Home / Self Care | Payer: 59 | Source: Ambulatory Visit | Attending: Vascular Surgery | Admitting: Vascular Surgery

## 2017-05-22 ENCOUNTER — Encounter (HOSPITAL_COMMUNITY): Payer: Self-pay | Admitting: *Deleted

## 2017-05-22 ENCOUNTER — Encounter (HOSPITAL_COMMUNITY)
Admission: RE | Disposition: A | Discharge status: Home / Self Care | Payer: Self-pay | Source: Ambulatory Visit | Attending: Vascular Surgery

## 2017-05-22 DIAGNOSIS — Z794 Long term (current) use of insulin: Secondary | ICD-10-CM | POA: Diagnosis not present

## 2017-05-22 DIAGNOSIS — Z8673 Personal history of transient ischemic attack (TIA), and cerebral infarction without residual deficits: Secondary | ICD-10-CM | POA: Diagnosis not present

## 2017-05-22 DIAGNOSIS — Z79899 Other long term (current) drug therapy: Secondary | ICD-10-CM | POA: Insufficient documentation

## 2017-05-22 DIAGNOSIS — E1122 Type 2 diabetes mellitus with diabetic chronic kidney disease: Secondary | ICD-10-CM | POA: Diagnosis not present

## 2017-05-22 DIAGNOSIS — I509 Heart failure, unspecified: Secondary | ICD-10-CM | POA: Diagnosis not present

## 2017-05-22 DIAGNOSIS — Z992 Dependence on renal dialysis: Secondary | ICD-10-CM | POA: Insufficient documentation

## 2017-05-22 DIAGNOSIS — I272 Pulmonary hypertension, unspecified: Secondary | ICD-10-CM | POA: Insufficient documentation

## 2017-05-22 DIAGNOSIS — N186 End stage renal disease: Secondary | ICD-10-CM | POA: Diagnosis not present

## 2017-05-22 DIAGNOSIS — I132 Hypertensive heart and chronic kidney disease with heart failure and with stage 5 chronic kidney disease, or end stage renal disease: Secondary | ICD-10-CM | POA: Insufficient documentation

## 2017-05-22 DIAGNOSIS — N185 Chronic kidney disease, stage 5: Secondary | ICD-10-CM | POA: Diagnosis not present

## 2017-05-22 DIAGNOSIS — E785 Hyperlipidemia, unspecified: Secondary | ICD-10-CM | POA: Diagnosis not present

## 2017-05-22 HISTORY — PX: AV FISTULA PLACEMENT: SHX1204

## 2017-05-22 LAB — POCT I-STAT 4, (NA,K, GLUC, HGB,HCT)
Glucose, Bld: 125 mg/dL — ABNORMAL HIGH (ref 65–99)
HCT: 29 % — ABNORMAL LOW (ref 36.0–46.0)
Hemoglobin: 9.9 g/dL — ABNORMAL LOW (ref 12.0–15.0)
Potassium: 3.4 mmol/L — ABNORMAL LOW (ref 3.5–5.1)
Sodium: 140 mmol/L (ref 135–145)

## 2017-05-22 LAB — GLUCOSE, CAPILLARY
Glucose-Capillary: 144 mg/dL — ABNORMAL HIGH (ref 65–99)
Glucose-Capillary: 104 mg/dL — ABNORMAL HIGH (ref 65–99)

## 2017-05-22 SURGERY — ARTERIOVENOUS (AV) FISTULA CREATION
Anesthesia: Monitor Anesthesia Care | Site: Arm Lower | Laterality: Right

## 2017-05-22 MED ORDER — LIDOCAINE-EPINEPHRINE (PF) 1 %-1:200000 IJ SOLN
INTRAMUSCULAR | Status: DC | PRN
  Administered 2017-05-22: 9 mL

## 2017-05-22 MED ORDER — FENTANYL CITRATE (PF) 250 MCG/5ML IJ SOLN
INTRAMUSCULAR | Status: DC | PRN
  Administered 2017-05-22: 50 ug via INTRAVENOUS

## 2017-05-22 MED ORDER — MIDAZOLAM HCL 5 MG/5ML IJ SOLN
INTRAMUSCULAR | Status: DC | PRN
  Administered 2017-05-22: 2 mg via INTRAVENOUS

## 2017-05-22 MED ORDER — PHENYLEPHRINE HCL 10 MG/ML IJ SOLN
INTRAVENOUS | Status: DC | PRN
  Administered 2017-05-22: 10 ug/min via INTRAVENOUS

## 2017-05-22 MED ORDER — PROPOFOL 10 MG/ML IV BOLUS
INTRAVENOUS | Status: AC
  Filled 2017-05-22: qty 20

## 2017-05-22 MED ORDER — HEPARIN SODIUM (PORCINE) 5000 UNIT/ML IJ SOLN
INTRAMUSCULAR | Status: DC | PRN
  Administered 2017-05-22: 10:00:00

## 2017-05-22 MED ORDER — 0.9 % SODIUM CHLORIDE (POUR BTL) OPTIME
TOPICAL | Status: DC | PRN
  Administered 2017-05-22: 1000 mL

## 2017-05-22 MED ORDER — DEXTROSE 5 % IV SOLN
INTRAVENOUS | Status: AC
  Filled 2017-05-22: qty 1.5

## 2017-05-22 MED ORDER — ROCURONIUM BROMIDE 10 MG/ML (PF) SYRINGE
PREFILLED_SYRINGE | INTRAVENOUS | Status: AC
  Filled 2017-05-22: qty 5

## 2017-05-22 MED ORDER — ONDANSETRON HCL 4 MG/2ML IJ SOLN
INTRAMUSCULAR | Status: DC | PRN
  Administered 2017-05-22: 4 mg via INTRAVENOUS

## 2017-05-22 MED ORDER — FENTANYL CITRATE (PF) 250 MCG/5ML IJ SOLN
INTRAMUSCULAR | Status: AC
  Filled 2017-05-22: qty 5

## 2017-05-22 MED ORDER — LIDOCAINE-EPINEPHRINE (PF) 1 %-1:200000 IJ SOLN
INTRAMUSCULAR | Status: AC
  Filled 2017-05-22: qty 30

## 2017-05-22 MED ORDER — PROPOFOL 1000 MG/100ML IV EMUL
INTRAVENOUS | Status: AC
  Filled 2017-05-22: qty 100

## 2017-05-22 MED ORDER — CHLORHEXIDINE GLUCONATE CLOTH 2 % EX PADS: 6.0000 | MEDICATED_PAD | Freq: Once | CUTANEOUS | Status: DC

## 2017-05-22 MED ORDER — PROPOFOL 500 MG/50ML IV EMUL
INTRAVENOUS | Status: DC | PRN
  Administered 2017-05-22: 100 ug/kg/min via INTRAVENOUS

## 2017-05-22 MED ORDER — DEXTROSE 5 % IV SOLN
1.5000 g | INTRAVENOUS | Status: AC
  Administered 2017-05-22: 1.5 g via INTRAVENOUS

## 2017-05-22 MED ORDER — ONDANSETRON HCL 4 MG/2ML IJ SOLN
INTRAMUSCULAR | Status: AC
  Filled 2017-05-22: qty 2

## 2017-05-22 MED ORDER — OXYCODONE-ACETAMINOPHEN 5-325 MG PO TABS: 1.0000 | ORAL_TABLET | Freq: Four times a day (QID) | ORAL | 0 refills | Status: DC | PRN

## 2017-05-22 MED ORDER — MIDAZOLAM HCL 2 MG/2ML IJ SOLN
INTRAMUSCULAR | Status: AC
  Filled 2017-05-22: qty 2

## 2017-05-22 MED ORDER — SODIUM CHLORIDE 0.9 % IV SOLN
INTRAVENOUS | Status: DC
  Administered 2017-05-22: 09:00:00 via INTRAVENOUS

## 2017-05-22 SURGICAL SUPPLY — 30 items
ARMBAND PINK RESTRICT EXTREMIT (MISCELLANEOUS) ×2 IMPLANT
CANISTER SUCT 3000ML PPV (MISCELLANEOUS) ×2 IMPLANT
CLIP TI MEDIUM 6 (CLIP) ×2 IMPLANT
CLIP TI WIDE RED SMALL 6 (CLIP) ×2 IMPLANT
COVER PROBE W GEL 5X96 (DRAPES) ×2 IMPLANT
DERMABOND ADVANCED (GAUZE/BANDAGES/DRESSINGS) ×1
DERMABOND ADVANCED .7 DNX12 (GAUZE/BANDAGES/DRESSINGS) ×1 IMPLANT
ELECT REM PT RETURN 9FT ADLT (ELECTROSURGICAL) ×2
ELECTRODE REM PT RTRN 9FT ADLT (ELECTROSURGICAL) ×1 IMPLANT
GLOVE BIO SURGEON STRL SZ 6.5 (GLOVE) ×4 IMPLANT
GLOVE BIO SURGEON STRL SZ7.5 (GLOVE) ×2 IMPLANT
GLOVE BIOGEL PI IND STRL 6.5 (GLOVE) ×3 IMPLANT
GLOVE BIOGEL PI INDICATOR 6.5 (GLOVE) ×3
GLOVE ECLIPSE 6.5 STRL STRAW (GLOVE) ×2 IMPLANT
GLOVE SURG SS PI 7.0 STRL IVOR (GLOVE) ×4 IMPLANT
GOWN STRL REUS W/ TWL LRG LVL3 (GOWN DISPOSABLE) ×2 IMPLANT
GOWN STRL REUS W/ TWL XL LVL3 (GOWN DISPOSABLE) ×2 IMPLANT
GOWN STRL REUS W/TWL LRG LVL3 (GOWN DISPOSABLE) ×2
GOWN STRL REUS W/TWL XL LVL3 (GOWN DISPOSABLE) ×2
KIT BASIN OR (CUSTOM PROCEDURE TRAY) ×2 IMPLANT
KIT ROOM TURNOVER OR (KITS) ×2 IMPLANT
NS IRRIG 1000ML POUR BTL (IV SOLUTION) ×2 IMPLANT
PACK CV ACCESS (CUSTOM PROCEDURE TRAY) ×2 IMPLANT
PAD ARMBOARD 7.5X6 YLW CONV (MISCELLANEOUS) ×4 IMPLANT
SUT MNCRL AB 4-0 PS2 18 (SUTURE) ×2 IMPLANT
SUT PROLENE 6 0 BV (SUTURE) ×2 IMPLANT
SUT VIC AB 3-0 SH 27 (SUTURE) ×1
SUT VIC AB 3-0 SH 27X BRD (SUTURE) ×1 IMPLANT
UNDERPAD 30X30 (UNDERPADS AND DIAPERS) ×2 IMPLANT
WATER STERILE IRR 1000ML POUR (IV SOLUTION) ×2 IMPLANT

## 2017-05-22 NOTE — Discharge Instructions (Signed)
° ° °  05/22/2017 Jody Nguyen [id number] [birth year]  Surgeon(s): Waynetta Sandy, MD  Procedure(s): Right radial cephalic AV fistula creation  x Do not stick fistula for 12 weeks

## 2017-05-22 NOTE — Transfer of Care (Signed)
Immediate Anesthesia Transfer of Care Note  Patient: Jody Nguyen  Procedure(s) Performed: Procedure(s): ARTERIOVENOUS (AV) FISTULA CREATION-RIGHT ARM (Right)  Patient Location: PACU  Anesthesia Type:MAC  Level of Consciousness: drowsy and patient cooperative  Airway & Oxygen Therapy: Patient Spontanous Breathing and Patient connected to face mask oxygen  Post-op Assessment: Report given to RN and Post -op Vital signs reviewed and stable  Post vital signs: Reviewed and stable  Last Vitals:  Vitals:   05/22/17 1025 05/22/17 1026  BP: (!) 151/94   Pulse: 77   Resp: 17   Temp: 36.9 C 36.9 C    Last Pain:  Vitals:   05/22/17 0711  TempSrc: Oral      Patients Stated Pain Goal: 4 (08/19/29 0762)  Complications: No apparent anesthesia complications

## 2017-05-22 NOTE — Anesthesia Preprocedure Evaluation (Signed)
Anesthesia Evaluation  Patient identified by MRN, date of birth, ID band Patient awake    Reviewed: Allergy & Precautions, H&P , NPO status , Patient's Chart, lab work & pertinent test results, reviewed documented beta blocker date and time   Airway Mallampati: III  TM Distance: >3 FB Neck ROM: Full    Dental no notable dental hx. (+) Teeth Intact, Dental Advisory Given   Pulmonary neg pulmonary ROS,    Pulmonary exam normal breath sounds clear to auscultation       Cardiovascular hypertension, Pt. on medications and Pt. on home beta blockers +CHF   Rhythm:Regular Rate:Normal     Neuro/Psych CVA, No Residual Symptoms negative psych ROS   GI/Hepatic negative GI ROS, Neg liver ROS,   Endo/Other  diabetes  Renal/GU ESRF and DialysisRenal disease  negative genitourinary   Musculoskeletal   Abdominal   Peds  Hematology negative hematology ROS (+) anemia ,   Anesthesia Other Findings   Reproductive/Obstetrics negative OB ROS                             Anesthesia Physical Anesthesia Plan  ASA: III  Anesthesia Plan: MAC   Post-op Pain Management:    Induction: Intravenous  PONV Risk Score and Plan: 2 and Ondansetron, Propofol and Midazolam  Airway Management Planned: Simple Face Mask  Additional Equipment:   Intra-op Plan:   Post-operative Plan:   Informed Consent: I have reviewed the patients History and Physical, chart, labs and discussed the procedure including the risks, benefits and alternatives for the proposed anesthesia with the patient or authorized representative who has indicated his/her understanding and acceptance.   Dental advisory given  Plan Discussed with: CRNA  Anesthesia Plan Comments:         Anesthesia Quick Evaluation

## 2017-05-22 NOTE — H&P (Signed)
HP  History of Present Illness: [name] with esrd currently via r ij catheter. Recent left arm fistula excision. Here for definitive access.   Past Medical History:  Diagnosis Date  . Anemia   . Chronic diastolic CHF (congestive heart failure) ([place])   . Chronic diastolic heart failure ([place])   . CKD (chronic kidney disease) stage 4, GFR 15-29 ml/min (HCC)    dialysis M/W/F  . Constipation   . CVA (cerebral infarction) 1997   no residual deficit  . Diverticulitis   . DM (diabetes mellitus) ([place])    type 2  . Heart murmur   . History of cardiovascular stress test    a. Myoview Oct 2012 showed EF 49%, no ischemia, LVE  . HLD (hyperlipidemia)    takes Crestor daily  . Hypertension   . Pericardial effusion    chronic; felt to be poss related to minoxidil >> DC'd  . Pulmonary hypertension ([place]) 06/18/2015  . Respiratory failure, acute hypoxic, post-operative 07/08/2015   Requiring ECMO support  . S/P cardiac catheterization    a. R/L HC 06/18/15:  mLAD 30%; severe pulmo HTN with PA sat 43%, CI 1.86, prominent V waves indicative of MR; resting hypoxemia O2 sat 86% on RA  . S/P minimally invasive mitral valve repair 07/08/2015   Complex valvuloplasty including artificial Gore-tex neochord placement x10 and 26 mm Sorin Memo 3D Rechord ring annuloplasty via right mini thoracotomy approach  . Severe mitral regurgitation   . Shortness of breath dyspnea   . Stroke ([place]) 1997   no residual effect  . Vitamin D deficiency   . Wears glasses     Past Surgical History:  Procedure Laterality Date  . AV FISTULA PLACEMENT Left 10/22/2015   Procedure: RADIOCEPHALIC ARTERIOVENOUS (AV) FISTULA CREATION;  Surgeon: Serafina Mitchell, MD;  Location: [facility];  Service: Vascular;  Laterality: Left;  . CANNULATION FOR CARDIOPULMONARY BYPASS N/A 07/08/2015   Procedure: CANNULATION FOR ECMO;  Surgeon: Rexene Alberts, MD;  Location: [facility];  Service: Open Heart Surgery;  Laterality: N/A;  .  CARDIAC CATHETERIZATION    . CARDIAC CATHETERIZATION N/A 06/18/2015   Procedure: Right/Left Heart Cath and Coronary Angiography;  Surgeon: Jettie Booze, MD;  Location: [facility];  Service: Cardiovascular;  Laterality: N/A;  . CESAREAN SECTION     x 2  . COLONOSCOPY    . FISTULA SUPERFICIALIZATION Left 12/28/2015   Procedure: SUPERFICIALIZATION LEFT RADIOCEPHALIC FISTULA;  Surgeon: Serafina Mitchell, MD;  Location: [facility];  Service: Vascular;  Laterality: Left;  . INSERTION OF DIALYSIS CATHETER Right 04/28/2017   Procedure: INSERTION OF DIALYSIS CATHETER-RIGHT INTERNAL JUGULAR PLACEMENT;  Surgeon: Elam Dutch, MD;  Location: [facility];  Service: Vascular;  Laterality: Right;  . LIGATION OF ARTERIOVENOUS  FISTULA Left 04/28/2017   Procedure: LIGATION OF ARTERIOVENOUS  FISTULA;  Surgeon: Elam Dutch, MD;  Location: [facility];  Service: Vascular;  Laterality: Left;  . MITRAL VALVE REPAIR Right 07/08/2015   Procedure: MINIMALLY INVASIVE MITRAL VALVE REPAIR with a 26 Sorin Memo 3D Rechord;  Surgeon: Rexene Alberts, MD;  Location: [facility];  Service: Open Heart Surgery;  Laterality: Right;  . MULTIPLE EXTRACTIONS WITH ALVEOLOPLASTY N/A 06/30/2015   Procedure: MULTIPLE EXTRACTIONS OF TOOTH #'S 4  AND 30 WITH ALVEOLOPLASTY AND GROSS DEBRIDEMENT  OF REMAINING TEETH;  Surgeon: Lenn Cal, DDS;  Location: [facility];  Service: Oral Surgery;  Laterality: N/A;  . PERIPHERAL VASCULAR CATHETERIZATION Left  03/28/2016   Procedure: A/V Fistulagram;  Surgeon: Serafina Mitchell, MD;  Location: [facility];  Service: Cardiovascular;  Laterality: Left;  lower arm  . TEE WITHOUT CARDIOVERSION N/A 06/08/2015   Procedure: TRANSESOPHAGEAL ECHOCARDIOGRAM (TEE);  Surgeon: Lelon Perla, MD;  Location: [facility];  Service: Cardiovascular;  Laterality: N/A;  . TEE WITHOUT CARDIOVERSION N/A 07/08/2015   Procedure: TRANSESOPHAGEAL ECHOCARDIOGRAM (TEE);  Surgeon: Rexene Alberts, MD;  Location: [facility];  Service:  Open Heart Surgery;  Laterality: N/A;    Allergies  Allergen Reactions  . Minoxidil Other (See Comments)    Pericardial effusion  . Lipitor [Atorvastatin] Other (See Comments)    MYALGIAS > "pain in legs" Tolerates rosuvastatin  . Ace Inhibitors Other (See Comments)    REACTION: "not sure...think it made me drowsy all the time"    Prior to Admission medications   Medication Sig Start Date End Date Taking? Authorizing Provider  busPIRone (BUSPAR) 10 MG tablet Take 1 tablet (10 mg total) by mouth 2 (two) times daily. 02/27/17 02/27/18 Yes Unk Pinto, MD  carvedilol (COREG) 6.25 MG tablet Take 1 tablet (6.25 mg total) by mouth 2 (two) times daily. 02/27/17 02/27/18 Yes Unk Pinto, MD  insulin aspart protamine - aspart (NOVOLOG MIX 70/30 FLEXPEN) (70-30) 100 UNIT/ML FlexPen Inject 0.1 mLs (10 Units total) into the skin 2 (two) times daily. 03/06/17  Yes Unk Pinto, MD  losartan (COZAAR) 50 MG tablet Take 1 tablet (50 mg total) by mouth daily. 02/27/17  Yes Unk Pinto, MD  RENVELA 800 MG tablet Take 1,600-4,000 mg by mouth 3 (three) times daily. 4000 mg (5 caps) with meals and 1600 mg (2 caps) with snacks 12/21/15  Yes [provider]  rosuvastatin (CRESTOR) 20 MG tablet Take 1 tablet (20 mg total) by mouth daily. 02/27/17  Yes Unk Pinto, MD  SENSIPAR 30 MG tablet Take 30 mg by mouth daily.  04/18/17  Yes [provider]  sertraline (ZOLOFT) 25 MG tablet Take 1 tablet (25 mg total) by mouth daily. 02/27/17  Yes Unk Pinto, MD    Social History   Social History  . Marital status: Married    Spouse name: N/A  . Number of children: 3  . Years of education: N/A   Occupational History  .      Unemployed; used to work as a Radio broadcast assistant   Social History Main Topics  . Smoking status: Never Smoker  . Smokeless tobacco: Never Used  . Alcohol use No  . Drug use: No  . Sexual activity: Not on file   Other Topics Concern  . Not on file     Social History Narrative  . No narrative on file     Family History  Problem Relation Age of Onset  . Cancer Sister        breast cancer  . Stroke Mother   . Heart attack Brother        MI in his 59s  . Heart disease Brother        before age 16  . Breast cancer Sister     ROS:  No complaints today  Physical Examination  Vitals:   05/22/17 0711  BP: 140/84  Pulse: 92  Resp: 18  Temp: 98.4 F (36.9 C)   Body mass index is 38.28 kg/m.  General:  WDWN in NAD Gait: Not observed HENT: WNL, normocephalic Pulmonary: normal non-labored breathing Cardiac:rrr Abdomen: soft Extremities: palpable right radial pulse Musculoskeletal: no muscle  wasting or atrophy  Neurologic: A&O X 3 SENSATION: normal; MOTOR FUNCTION:  moving all extremities equally. Speech is fluent/normal Psychiatric: appropriate mood and affect  CBC    Component Value Date/Time   WBC 5.6 04/28/2017 0554   RBC 2.67 (L) 04/28/2017 0554   HGB 9.9 (L) 05/22/2017 0747   HGB 12.1 03/03/2013 0955   HCT 29.0 (L) 05/22/2017 0747   HCT 37.4 03/03/2013 0955   PLT 186 04/28/2017 0554   PLT 288 03/03/2013 0955   MCV 87.6 04/28/2017 0554   MCV 77.9 (L) 03/03/2013 0955   MCH 27.3 04/28/2017 0554   MCHC 31.2 04/28/2017 0554   RDW 18.2 (H) 04/28/2017 0554   RDW 16.1 (H) 03/03/2013 0955   LYMPHSABS 2.0 04/27/2017 1930   LYMPHSABS 1.8 03/03/2013 0955   MONOABS 0.5 04/27/2017 1930   MONOABS 0.4 03/03/2013 0955   EOSABS 0.2 04/27/2017 1930   EOSABS 0.3 03/03/2013 0955   BASOSABS 0.0 04/27/2017 1930   BASOSABS 0.0 03/03/2013 0955    BMET    Component Value Date/Time   NA 140 05/22/2017 0747   NA 140 03/03/2013 0955   K 3.4 (L) 05/22/2017 0747   K 3.8 03/03/2013 0955   CL 91 (L) 04/28/2017 0554   CL 99 03/03/2013 0955   CO2 26 04/28/2017 0554   CO2 29 03/03/2013 0955   GLUCOSE 125 (H) 05/22/2017 0747   GLUCOSE 210 (H) 03/03/2013 0955   BUN 30 (H) 04/28/2017 0554   BUN 28.1 (H) 03/03/2013 0955    CREATININE 6.87 (H) 04/28/2017 0554   CREATININE 6.70 (H) 03/06/2017 0943   CREATININE 1.9 (H) 03/03/2013 0955   CALCIUM 8.9 04/28/2017 0554   CALCIUM 9.9 03/03/2013 0955   GFRNONAA 6 (L) 04/28/2017 0554   GFRNONAA 6 (L) 03/06/2017 0943   GFRAA 7 (L) 04/28/2017 0554   GFRAA 7 (L) 03/06/2017 0943    COAGS: Lab Results  Component Value Date   INR 1.05 04/28/2017   INR 1.04 04/27/2017   INR 1.25 10/22/2015     Non-Invasive Vascular Imaging:   Vein mapping and arterial duplex reviewed.   ASSESSMENT/PLAN: [name] right arm av fistula. Possible cimino vs kaufman. Risks and benefits discussed and she agrees to proceed.   Jonne Rote C. Donzetta Matters, MD [facility] Office: [phone] Pager: [phone]

## 2017-05-22 NOTE — Op Note (Signed)
    Patient name: Jody Nguyen MRN: [id number] DOB: [birth year] Sex: female  05/22/2017 Pre-operative Diagnosis: esrd Post-operative diagnosis:  Same Surgeon:  Erlene Quan C. Donzetta Matters, MD Assistant: Leontine Locket, PA Procedure Performed: Right arm radiocephalic av fistula creation  Indications:  61 year old female history of end-stage renal disease with failed left upper extremity AV access sees is now indicated for right upper extremity fistula creation.  Findings: The vein was adequate size by ultrasound and dilated to 3 mm easily. The artery was 3 mm externally. At completion there was a strong signal in the runoff vein and radial and ulnar signals past the fistula.   Procedure:  The patient was identified in the holding area and taken to the operating room where she is placed supine on the operating table Mac anesthesia was induced. She was sterilely prepped and draped in her right upper extremity in usual fashion timeout called. She was given antibiotics prior to this procedure. We identified the vein and artery by ultrasound and instilled 9 mL 1% lidocaine with epinephrine at the operative site. A vertical incision was made we dissected down identified our vein dissected out for a few centimeters proximally and distally. We dissected down to artery placed vessel loop around this. The vein was then clamped cephalad on the arm and then divided distally and oversewn. We then opened the artery longitudinally flushed with heparinized saline distally and proximally. We prepared our vein by spatulating and dilating up to 3 mm. We then sewed end-to-side with 6-0 Prolene suture. Prior to completing anastomosis we allowed flushing techniques. We then completed the anastomosis we had palpable pulsatility in the runoff vein. There was a very strong signal in the runoff vein as well as signal at the radial artery and ulnar artery beyond the fistula creation. Radial artery signal did augment with compression of the  fistula. Satisfied with this we obtained hemostasis in the wound closed in 2 layers with 3-0 Vicryl and 4-0 Monocryl. Patient tolerated procedure well without immediate complication. All counts were correct at completion.     Adriane Gabbert C. Donzetta Matters, MD [facility] Office: [phone] Pager: [phone]

## 2017-05-22 NOTE — Anesthesia Postprocedure Evaluation (Signed)
Anesthesia Post Note  Patient: Jody Nguyen  Procedure(s) Performed: Procedure(s) (LRB): ARTERIOVENOUS (AV) FISTULA CREATION-RIGHT ARM (Right)     Patient location during evaluation: PACU Anesthesia Type: MAC Level of consciousness: awake and alert Pain management: pain level controlled Vital Signs Assessment: post-procedure vital signs reviewed and stable Respiratory status: spontaneous breathing, nonlabored ventilation and respiratory function stable Cardiovascular status: stable and blood pressure returned to baseline Anesthetic complications: no    Last Vitals:  Vitals:   05/22/17 1100 05/22/17 1111  BP:  (!) 169/91  Pulse: 73   Resp: 15   Temp: 36.7 C     Last Pain:  Vitals:   05/22/17 0711  TempSrc: Oral                 Brysyn Brandenberger,W. EDMOND

## 2017-05-23 ENCOUNTER — Encounter (HOSPITAL_COMMUNITY): Payer: Self-pay | Admitting: Vascular Surgery

## 2017-05-29 ENCOUNTER — Telehealth: Payer: Self-pay | Admitting: Vascular Surgery

## 2017-05-29 NOTE — Telephone Encounter (Signed)
-----   Message from Mena Goes, RN sent at 05/22/2017 12:16 PM EDT ----- Regarding: 4-6 weejs w/ duplex   ----- Message ----- From: Gabriel Earing, PA-C Sent: 05/22/2017  10:11 AM To: Vvs Charge Pool  S/p right RC AVF 05/22/17.  F/u in 4-6 weeks with Dr. Donzetta Matters with duplex.  Thanks

## 2017-05-29 NOTE — Telephone Encounter (Signed)
Spoke to pt to sch lab 06/22/17 at 4:00 and MD 06/27/17 at 1:30.

## 2017-06-06 NOTE — Progress Notes (Signed)
Assessment and Plan:   Essential hypertension - continue medications, DASH diet, exercise and monitor at home. Call if greater than 130/80.  -     CBC with Differential/Platelet -     BASIC METABOLIC PANEL WITH GFR -     Hepatic function panel -     TSH  Chronic diastolic heart failure (HCC) Weight is stable, not on dialysis day, will check labs  Pulmonary hypertension ([place]) Monitoring, has improved  Cardiomyopathy, unspecified type ([place]) Weight is stable, not on dialysis day, will check labs  Paroxysmal atrial fibrillation ([place]) Rate controlled, no symptoms  Insulin dependent diabetes with renal manifestation [facility]) Discussed general issues about diabetes pathophysiology and management., Educational material distributed., Suggested low cholesterol diet., Encouraged aerobic exercise., Discussed foot care., Reminded to get yearly retinal exam. -     Hemoglobin A1c  ESRD on dialysis ([place]) Continue dialysis  Mixed hyperlipidemia -continue medications, check lipids, decrease fatty foods, increase activity.  -     Lipid panel  Morbid obesity (BMI 36.05)  - long discussion about weight loss, diet, and exercise  Medication management -     Magnesium  Cough Will get CXR since recently in the hospital with port placed on right side and some RLL base abnormality/rhonchi, breo samples given, astelin, and cough syrup, if not better may switch losartan to diovan, weight is stable.  -     DG Chest 2 View; Future -     promethazine-dextromethorphan (PROMETHAZINE-DM) 6.25-15 MG/5ML syrup; Take 5 mLs by mouth 4 (four) times daily as needed for cough. -     azelastine (ASTELIN) 0.1 % nasal spray; Place 2 sprays into both nostrils 2 (two) times daily. Use in each nostril as directed     Continue diet and meds as discussed. Further disposition pending results of labs. Discussed med's effects and SE's.    HPI 61 y.o. female  presents for 3 month follow up with hypertension,  hyperlipidemia, diabetes and vitamin D.  Her blood pressure has been controlled at home, HAS NOT TAKE BP MEDS TODAY, negative RAS [place], today their BP is BP: 140/88.  She does not workout.  She denies chest pain, shortness of breath, dizziness. She has diastolic CHF, follows with Dr. Stanford Breed.  She had normal cardiolite 2012, had had CVA in the past, she is on bASA.  She has had cough sounds wet but nonproductive, mostly at night, no fever, chills, wheezing, SOB. Has has some sinus drainage x 1 month, will come back.  2016 had minimally invasive MVR with complications, can see notes, doing well today.  She had recent issue with dialysis access in June and had AV fistula placed in her right arm.   She is on cholesterol medication and denies myalgias. Her cholesterol is not at goal. The cholesterol was:  03/06/2017: Cholesterol 195; HDL 50; LDL Cholesterol 109; Triglycerides 178  She has been working on diet and exercise for diabetes with diabetic chronic kidney disease and ESRD on dialysis had yesterday, insulin dependent on 70/30 mix but honestly not using anymore, she is on bASA, she is on ACE/ARB, and denies  polydipsia, polyuria and visual disturbances Last A1C was: 03/06/2017: Hgb A1c MFr Bld 6.1  Patient is on Vitamin D supplement. 03/06/2017: Vit D, 25-Hydroxy 31  BMI is Body mass index is 38.75 kg/m., she is working on diet and exercise. Has had sleep study.  Wt Readings from Last 3 Encounters:  06/07/17 198 lb 6.4 oz (90 kg)  05/22/17 196 lb (88.9  kg)  04/27/17 196 lb 3.4 oz (89 kg)    Current Medications:  Current Outpatient Prescriptions on File Prior to Visit  Medication Sig Dispense Refill  . busPIRone (BUSPAR) 10 MG tablet Take 1 tablet (10 mg total) by mouth 2 (two) times daily. 180 tablet 1  . carvedilol (COREG) 6.25 MG tablet Take 1 tablet (6.25 mg total) by mouth 2 (two) times daily. 180 tablet 1  . insulin aspart protamine - aspart (NOVOLOG MIX 70/30 FLEXPEN) (70-30) 100 UNIT/ML  FlexPen Inject 0.1 mLs (10 Units total) into the skin 2 (two) times daily. 15 mL 3  . losartan (COZAAR) 50 MG tablet Take 1 tablet (50 mg total) by mouth daily. 90 tablet 1  . oxyCODONE-acetaminophen (ROXICET) 5-325 MG tablet Take 1 tablet by mouth every 6 (six) hours as needed for severe pain. 8 tablet 0  . RENVELA 800 MG tablet Take 1,600-4,000 mg by mouth 3 (three) times daily. 4000 mg (5 caps) with meals and 1600 mg (2 caps) with snacks  5  . rosuvastatin (CRESTOR) 20 MG tablet Take 1 tablet (20 mg total) by mouth daily. 90 tablet 1  . SENSIPAR 30 MG tablet Take 30 mg by mouth daily.     . sertraline (ZOLOFT) 25 MG tablet Take 1 tablet (25 mg total) by mouth daily. 90 tablet 1   No current facility-administered medications on file prior to visit.    Medical History:  Past Medical History:  Diagnosis Date  . Anemia   . Chronic diastolic CHF (congestive heart failure) ([place])   . Chronic diastolic heart failure ([place])   . CKD (chronic kidney disease) stage 4, GFR 15-29 ml/min (HCC)    dialysis M/W/F  . Constipation   . CVA (cerebral infarction) 1997   no residual deficit  . Diverticulitis   . DM (diabetes mellitus) ([place])    type 2  . Heart murmur   . History of cardiovascular stress test    a. Myoview Oct 2012 showed EF 49%, no ischemia, LVE  . HLD (hyperlipidemia)    takes Crestor daily  . Hypertension   . Pericardial effusion    chronic; felt to be poss related to minoxidil >> DC'd  . Pulmonary hypertension ([place]) 06/18/2015  . Respiratory failure, acute hypoxic, post-operative 07/08/2015   Requiring ECMO support  . S/P cardiac catheterization    a. R/L HC 06/18/15:  mLAD 30%; severe pulmo HTN with PA sat 43%, CI 1.86, prominent V waves indicative of MR; resting hypoxemia O2 sat 86% on RA  . S/P minimally invasive mitral valve repair 07/08/2015   Complex valvuloplasty including artificial Gore-tex neochord placement x10 and 26 mm Sorin Memo 3D Rechord ring annuloplasty via right  mini thoracotomy approach  . Severe mitral regurgitation   . Shortness of breath dyspnea   . Stroke ([place]) 1997   no residual effect  . Vitamin D deficiency   . Wears glasses    Allergies:  Allergies  Allergen Reactions  . Minoxidil Other (See Comments)    Pericardial effusion  . Lipitor [Atorvastatin] Other (See Comments)    MYALGIAS > "pain in legs" Tolerates rosuvastatin  . Ace Inhibitors Other (See Comments)    REACTION: "not sure...think it made me drowsy all the time"     Review of Systems:  Review of Systems  Constitutional: Negative for chills, diaphoresis, fever, malaise/fatigue and weight loss.  HENT: Negative.   Respiratory: Positive for cough. Negative for hemoptysis, sputum production, shortness of breath and wheezing.  Cardiovascular: Negative.   Gastrointestinal: Negative for abdominal pain, blood in stool, constipation, diarrhea, heartburn, melena, nausea and vomiting.  Genitourinary: Negative.   Musculoskeletal: Negative.   Skin: Negative.   Neurological: Negative.  Negative for weakness.  Psychiatric/Behavioral: Negative.     Family history- Review and unchanged Social history- Review and unchanged Physical Exam: BP 140/88   Pulse 82   Temp 97.6 F (36.4 C)   Resp 16   Ht 5' (1.524 m)   Wt 198 lb 6.4 oz (90 kg)   SpO2 98%   BMI 38.75 kg/m  Wt Readings from Last 3 Encounters:  06/07/17 198 lb 6.4 oz (90 kg)  05/22/17 196 lb (88.9 kg)  04/27/17 196 lb 3.4 oz (89 kg)   General Appearance: Well nourished, in no apparent distress. Eyes: PERRLA, EOMs, conjunctiva no swelling or erythema Sinuses: No Frontal/maxillary tenderness ENT/Mouth: Ext aud canals clear, TMs without erythema, bulging. No erythema, swelling, or exudate on post pharynx.  Tonsils not swollen or erythematous. Hearing normal. Crowded mouth.  Neck: Supple, thyroid normal.  Respiratory: Respiratory effort normal, BS equal bilaterally rhonchi RLL without rales, wheezing or stridor.   Cardio: RRR with systolic murmur, prominent S2. Brisk peripheral pulses with 1+ edema. Dialysis graft in the right forearm with palpable thrill, well healed, no warmth, redness.  Abdomen: Soft, + BS, obese  Non tender, no guarding, rebound, hernias, masses. Lymphatics: Non tender without lymphadenopathy.  Musculoskeletal: Full ROM, 5/5 strength, Normal gait Skin: Warm, dry without rashes, lesions, ecchymosis.  Neuro: Cranial nerves intact. No cerebellar symptoms.  Psych: Awake and oriented X 3, normal affect, Insight and Judgment appropriate.    Vicie Mutters, PA-C 9:36 AM [facility]

## 2017-06-07 ENCOUNTER — Ambulatory Visit (HOSPITAL_COMMUNITY)
Admission: RE | Admit: 2017-06-07 | Discharge: 2017-06-07 | Disposition: A | Discharge status: Home / Self Care | Payer: 59 | Source: Ambulatory Visit | Attending: Physician Assistant | Admitting: Physician Assistant

## 2017-06-07 ENCOUNTER — Encounter: Payer: Self-pay | Admitting: Physician Assistant

## 2017-06-07 ENCOUNTER — Ambulatory Visit (INDEPENDENT_AMBULATORY_CARE_PROVIDER_SITE_OTHER): Payer: 59 | Admitting: Physician Assistant

## 2017-06-07 VITALS — BP 140/88 | HR 82 | Temp 97.6°F | Resp 16 | Ht 60.0 in | Wt 198.4 lb

## 2017-06-07 DIAGNOSIS — Z79899 Other long term (current) drug therapy: Secondary | ICD-10-CM

## 2017-06-07 DIAGNOSIS — N186 End stage renal disease: Secondary | ICD-10-CM | POA: Diagnosis not present

## 2017-06-07 DIAGNOSIS — I5032 Chronic diastolic (congestive) heart failure: Secondary | ICD-10-CM | POA: Diagnosis not present

## 2017-06-07 DIAGNOSIS — I480 Paroxysmal atrial fibrillation: Secondary | ICD-10-CM

## 2017-06-07 DIAGNOSIS — E782 Mixed hyperlipidemia: Secondary | ICD-10-CM | POA: Diagnosis not present

## 2017-06-07 DIAGNOSIS — I10 Essential (primary) hypertension: Secondary | ICD-10-CM | POA: Diagnosis not present

## 2017-06-07 DIAGNOSIS — R059 Cough, unspecified: Secondary | ICD-10-CM

## 2017-06-07 DIAGNOSIS — I517 Cardiomegaly: Secondary | ICD-10-CM | POA: Diagnosis not present

## 2017-06-07 DIAGNOSIS — Z992 Dependence on renal dialysis: Secondary | ICD-10-CM

## 2017-06-07 DIAGNOSIS — I429 Cardiomyopathy, unspecified: Secondary | ICD-10-CM

## 2017-06-07 DIAGNOSIS — R05 Cough: Secondary | ICD-10-CM

## 2017-06-07 DIAGNOSIS — E1129 Type 2 diabetes mellitus with other diabetic kidney complication: Secondary | ICD-10-CM

## 2017-06-07 DIAGNOSIS — IMO0001 Reserved for inherently not codable concepts without codable children: Secondary | ICD-10-CM

## 2017-06-07 DIAGNOSIS — I272 Pulmonary hypertension, unspecified: Secondary | ICD-10-CM

## 2017-06-07 DIAGNOSIS — Z794 Long term (current) use of insulin: Secondary | ICD-10-CM

## 2017-06-07 LAB — BASIC METABOLIC PANEL WITH GFR
BUN: 30 mg/dL — ABNORMAL HIGH (ref 7–25)
CO2: 28 mmol/L (ref 20–31)
Calcium: 9.2 mg/dL (ref 8.6–10.4)
Chloride: 96 mmol/L — ABNORMAL LOW (ref 98–110)
Creat: 5.57 mg/dL — ABNORMAL HIGH (ref 0.50–0.99)
GFR, Est African American: 9 mL/min — ABNORMAL LOW (ref >=60)
GFR, Est Non African American: 8 mL/min — ABNORMAL LOW (ref >=60)
Glucose, Bld: 106 mg/dL — ABNORMAL HIGH (ref 65–99)
Potassium: 4 mmol/L (ref 3.5–5.3)
Sodium: 137 mmol/L (ref 135–146)

## 2017-06-07 LAB — CBC WITH DIFFERENTIAL/PLATELET
Basophils Absolute: 53 cells/uL (ref 0–200)
Basophils Relative: 1 %
Eosinophils Absolute: 371 cells/uL (ref 15–500)
Eosinophils Relative: 7 %
HCT: 31.9 % — ABNORMAL LOW (ref 35.0–45.0)
Hemoglobin: 9.9 g/dL — ABNORMAL LOW (ref 11.7–15.5)
Lymphocytes Relative: 29 %
Lymphs Abs: 1537 cells/uL (ref 850–3900)
MCH: 28 pg (ref 27.0–33.0)
MCHC: 31 g/dL — ABNORMAL LOW (ref 32.0–36.0)
MCV: 90.1 fL (ref 80.0–100.0)
MPV: 10.3 fL (ref 7.5–12.5)
Monocytes Absolute: 636 cells/uL (ref 200–950)
Monocytes Relative: 12 %
Neutro Abs: 2703 cells/uL (ref 1500–7800)
Neutrophils Relative %: 51 %
Platelets: 298 10*3/uL (ref 140–400)
RBC: 3.54 MIL/uL — ABNORMAL LOW (ref 3.80–5.10)
RDW: 18.9 % — ABNORMAL HIGH (ref 11.0–15.0)
WBC: 5.3 10*3/uL (ref 3.8–10.8)

## 2017-06-07 LAB — LIPID PANEL
Cholesterol: 113 mg/dL (ref <200)
HDL: 50 mg/dL — ABNORMAL LOW (ref >50)
LDL Cholesterol: 38 mg/dL (ref <100)
Total CHOL/HDL Ratio: 2.3 Ratio (ref <5.0)
Triglycerides: 127 mg/dL (ref <150)
VLDL: 25 mg/dL (ref <30)

## 2017-06-07 LAB — HEPATIC FUNCTION PANEL
ALT: 10 U/L (ref 6–29)
AST: 14 U/L (ref 10–35)
Albumin: 4.4 g/dL (ref 3.6–5.1)
Alkaline Phosphatase: 96 U/L (ref 33–130)
Bilirubin, Direct: 0.1 mg/dL (ref <=0.2)
Indirect Bilirubin: 0.4 mg/dL (ref 0.2–1.2)
Total Bilirubin: 0.5 mg/dL (ref 0.2–1.2)
Total Protein: 8.1 g/dL (ref 6.1–8.1)

## 2017-06-07 LAB — TSH: TSH: 3.19 mIU/L

## 2017-06-07 LAB — MAGNESIUM: Magnesium: 2.3 mg/dL (ref 1.5–2.5)

## 2017-06-07 MED ORDER — PROMETHAZINE-DM 6.25-15 MG/5ML PO SYRP: 5.0000 mL | ORAL_SOLUTION | Freq: Four times a day (QID) | ORAL | 1 refills | Status: DC | PRN

## 2017-06-07 MED ORDER — AZELASTINE HCL 0.1 % NA SOLN: 2.0000 | Freq: Two times a day (BID) | NASAL | 2 refills | Status: DC

## 2017-06-07 NOTE — Patient Instructions (Addendum)
Do the allergy spray at night Do the breo, do 1 puff in the morning and then brush your teeth to avoid yeast in your mouth.  Go get chest xray If this does not get better we may swtich your losartan   Cough, Adult Coughing is a reflex that clears your throat and your airways. Coughing helps to heal and protect your lungs. It is normal to cough occasionally, but a cough that happens with other symptoms or lasts a long time may be a sign of a condition that needs treatment. A cough may last only 2-3 weeks (acute), or it may last longer than 8 weeks (chronic). What are the causes? Coughing is commonly caused by:  Breathing in substances that irritate your lungs.  A viral or bacterial respiratory infection.  Allergies.  Asthma.  Postnasal drip.  Smoking.  Acid backing up from the stomach into the esophagus (gastroesophageal reflux).  Certain medicines.  Chronic lung problems, including COPD (or rarely, lung cancer).  Other medical conditions such as heart failure.  Follow these instructions at home: Pay attention to any changes in your symptoms. Take these actions to help with your discomfort:  Take medicines only as told by your health care provider. ? If you were prescribed an antibiotic medicine, take it as told by your health care provider. Do not stop taking the antibiotic even if you start to feel better. ? Talk with your health care provider before you take a cough suppressant medicine.  Drink enough fluid to keep your urine clear or pale yellow.  If the air is dry, use a cold steam vaporizer or humidifier in your bedroom or your home to help loosen secretions.  Avoid anything that causes you to cough at work or at home.  If your cough is worse at night, try sleeping in a semi-upright position.  Avoid cigarette smoke. If you smoke, quit smoking. If you need help quitting, ask your health care provider.  Avoid caffeine.  Avoid alcohol.  Rest as needed.  Contact  a health care provider if:  You have new symptoms.  You cough up pus.  Your cough does not get better after 2-3 weeks, or your cough gets worse.  You cannot control your cough with suppressant medicines and you are losing sleep.  You develop pain that is getting worse or pain that is not controlled with pain medicines.  You have a fever.  You have unexplained weight loss.  You have night sweats. Get help right away if:  You cough up blood.  You have difficulty breathing.  Your heartbeat is very fast. This information is not intended to replace advice given to you by your health care provider. Make sure you discuss any questions you have with your health care provider. Document Released: 05/12/2011 Document Revised: 04/20/2016 Document Reviewed: 01/20/2015 Elsevier Interactive Patient Education  2017 [place] also include fruit juice, alcohol, and sweet tea. These are empty calories that do not signal to your brain that you are full.   Please remember the good carbs are still carbs which convert into sugar. So please measure them out no more than 1/2-1 cup of rice, oatmeal, pasta, and beans  Veggies are however free foods! Pile them on.   Not all fruit is created equal. Please see the list below, the fruit at the bottom is higher in sugars than the fruit at the top. Please avoid all dried fruits.   Monitor your blood pressure  at home. Go to the ER if any CP, SOB, nausea, dizziness, severe HA, changes vision/speech  Goal BP:  For patients younger than 60: Goal BP < 140/90. For patients 60 and older: Goal BP < 150/90. For patients with diabetes: Goal BP < 140/90. Your most recent BP: BP: 140/88   Take your medications faithfully as instructed. Maintain a healthy weight. Get at least 150 minutes of aerobic exercise per week. Minimize salt intake. Minimize alcohol intake  DASH Eating Plan DASH stands for "Dietary Approaches to Stop Hypertension."  The DASH eating plan is a healthy eating plan that has been shown to reduce high blood pressure (hypertension). Additional health benefits may include reducing the risk of type 2 diabetes mellitus, heart disease, and stroke. The DASH eating plan may also help with weight loss. WHAT DO I NEED TO KNOW ABOUT THE DASH EATING PLAN? For the DASH eating plan, you will follow these general guidelines:  Choose foods with a percent daily value for sodium of less than 5% (as listed on the food label).  Use salt-free seasonings or herbs instead of table salt or sea salt.  Check with your health care provider or pharmacist before using salt substitutes.  Eat lower-sodium products, often labeled as "lower sodium" or "no salt added."  Eat fresh foods.  Eat more vegetables, fruits, and low-fat dairy products.  Choose whole grains. Look for the word "whole" as the first word in the ingredient list.  Choose fish and skinless chicken or [place] more often than red meat. Limit fish, poultry, and meat to 6 oz (170 g) each day.  Limit sweets, desserts, sugars, and sugary drinks.  Choose heart-healthy fats.  Limit cheese to 1 oz (28 g) per day.  Eat more home-cooked food and less restaurant, buffet, and fast food.  Limit fried foods.  Cook foods using methods other than frying.  Limit canned vegetables. If you do use them, rinse them well to decrease the sodium.  When eating at a restaurant, ask that your food be prepared with less salt, or no salt if possible. WHAT FOODS CAN I EAT? Seek help from a dietitian for individual calorie needs. Grains Whole grain or whole wheat bread. Brown rice. Whole grain or whole wheat pasta. Quinoa, bulgur, and whole grain cereals. Low-sodium cereals. Corn or whole wheat flour tortillas. Whole grain cornbread. Whole grain crackers. Low-sodium crackers. Vegetables Fresh or frozen vegetables (raw, steamed, roasted, or grilled). Low-sodium or reduced-sodium tomato and  vegetable juices. Low-sodium or reduced-sodium tomato sauce and paste. Low-sodium or reduced-sodium canned vegetables.  Fruits All fresh, canned (in natural juice), or frozen fruits. Meat and Other Protein Products Ground beef (85% or leaner), grass-fed beef, or beef trimmed of fat. Skinless chicken or [place]. Ground chicken or [place]. Pork trimmed of fat. All fish and seafood. Eggs. Dried beans, peas, or lentils. Unsalted nuts and seeds. Unsalted canned beans. Dairy Low-fat dairy products, such as skim or 1% milk, 2% or reduced-fat cheeses, low-fat ricotta or cottage cheese, or plain low-fat yogurt. Low-sodium or reduced-sodium cheeses. Fats and Oils Tub margarines without trans fats. Light or reduced-fat mayonnaise and salad dressings (reduced sodium). Avocado. Safflower, olive, or canola oils. Natural peanut or almond butter. Other Unsalted popcorn and pretzels. The items listed above may not be a complete list of recommended foods or beverages. Contact your dietitian for more options. WHAT FOODS ARE NOT RECOMMENDED? Grains White bread. White pasta. White rice. Refined cornbread. Bagels and croissants. Crackers that contain trans fat. Vegetables Creamed  or fried vegetables. Vegetables in a cheese sauce. Regular canned vegetables. Regular canned tomato sauce and paste. Regular tomato and vegetable juices. Fruits Dried fruits. Canned fruit in light or heavy syrup. Fruit juice. Meat and Other Protein Products Fatty cuts of meat. Ribs, chicken wings, bacon, sausage, bologna, salami, chitterlings, fatback, hot dogs, bratwurst, and packaged luncheon meats. Salted nuts and seeds. Canned beans with salt. Dairy Whole or 2% milk, cream, half-and-half, and cream cheese. Whole-fat or sweetened yogurt. Full-fat cheeses or blue cheese. Nondairy creamers and whipped toppings. Processed cheese, cheese spreads, or cheese curds. Condiments Onion and garlic salt, seasoned salt, table salt, and sea salt.  Canned and packaged gravies. Worcestershire sauce. Tartar sauce. Barbecue sauce. Teriyaki sauce. Soy sauce, including reduced sodium. Steak sauce. Fish sauce. Oyster sauce. Cocktail sauce. Horseradish. Ketchup and mustard. Meat flavorings and tenderizers. Bouillon cubes. Hot sauce. Tabasco sauce. Marinades. Taco seasonings. Relishes. Fats and Oils Butter, stick margarine, lard, shortening, ghee, and bacon fat. Coconut, palm kernel, or palm oils. Regular salad dressings. Other Pickles and olives. Salted popcorn and pretzels. The items listed above may not be a complete list of foods and beverages to avoid. Contact your dietitian for more information. WHERE CAN I FIND MORE INFORMATION? National Heart, Lung, and Blood Institute: travelstabloid.com Document Released: 11/02/2011 Document Revised: 03/30/2014 Document Reviewed: 09/17/2013 [facility] Patient Information 2015 [place], [place]. This information is not intended to replace advice given to you by your health care provider. Make sure you discuss any questions you have with your health care provider.

## 2017-06-07 NOTE — Progress Notes (Signed)
Pt aware of lab results & voiced understanding of those results.

## 2017-06-07 NOTE — Progress Notes (Signed)
LVM for pt to return office call for LAB results.

## 2017-06-08 LAB — HEMOGLOBIN A1C
Hgb A1c MFr Bld: 5.7 % — ABNORMAL HIGH (ref <5.7)
Mean Plasma Glucose: 117 mg/dL

## 2017-06-08 NOTE — Progress Notes (Signed)
LVM for pt to return office call for LAB results.

## 2017-06-08 NOTE — Progress Notes (Signed)
Pt aware of lab results & voiced understanding of those results.

## 2017-06-15 ENCOUNTER — Other Ambulatory Visit: Payer: Self-pay

## 2017-06-15 DIAGNOSIS — Z992 Dependence on renal dialysis: Principal | ICD-10-CM

## 2017-06-15 DIAGNOSIS — N186 End stage renal disease: Secondary | ICD-10-CM

## 2017-06-18 ENCOUNTER — Encounter: Payer: Self-pay | Admitting: Vascular Surgery

## 2017-06-22 ENCOUNTER — Encounter: Payer: Self-pay | Admitting: Vascular Surgery

## 2017-06-22 ENCOUNTER — Encounter (HOSPITAL_COMMUNITY): Payer: Self-pay

## 2017-06-22 ENCOUNTER — Ambulatory Visit (HOSPITAL_COMMUNITY)
Admission: RE | Admit: 2017-06-22 | Discharge: 2017-06-22 | Disposition: A | Discharge status: Home / Self Care | Payer: 59 | Source: Ambulatory Visit | Attending: Vascular Surgery | Admitting: Vascular Surgery

## 2017-06-22 DIAGNOSIS — N186 End stage renal disease: Secondary | ICD-10-CM | POA: Diagnosis present

## 2017-06-22 DIAGNOSIS — Z4931 Encounter for adequacy testing for hemodialysis: Secondary | ICD-10-CM | POA: Diagnosis not present

## 2017-06-22 DIAGNOSIS — Z992 Dependence on renal dialysis: Secondary | ICD-10-CM | POA: Diagnosis not present

## 2017-06-27 ENCOUNTER — Encounter: Payer: Self-pay | Admitting: Vascular Surgery

## 2017-06-27 ENCOUNTER — Ambulatory Visit (INDEPENDENT_AMBULATORY_CARE_PROVIDER_SITE_OTHER): Payer: Self-pay | Admitting: Physician Assistant

## 2017-06-27 ENCOUNTER — Other Ambulatory Visit: Payer: Self-pay | Admitting: *Deleted

## 2017-06-27 ENCOUNTER — Encounter (HOSPITAL_COMMUNITY): Payer: Self-pay | Admitting: *Deleted

## 2017-06-27 VITALS — BP 182/114 | HR 95 | Temp 98.1°F | Resp 14 | Ht 60.0 in | Wt 195.0 lb

## 2017-06-27 DIAGNOSIS — N186 End stage renal disease: Secondary | ICD-10-CM

## 2017-06-27 DIAGNOSIS — Z992 Dependence on renal dialysis: Secondary | ICD-10-CM

## 2017-06-27 NOTE — Progress Notes (Signed)
  POST OPERATIVE OFFICE NOTE    CC:  F/u for surgery  HPI:  [name] who is s/p Right AV fistula creation.  She has no complaints of numbness, weakness or right hand pain.  She also has been have difficulties with her right tunneled catheter and decreased run times.  No meidcation changes since surgery and no new medical problems.  Allergies  Allergen Reactions  . Minoxidil Other (See Comments)    Pericardial effusion  . Lipitor [Atorvastatin] Other (See Comments)    MYALGIAS > "pain in legs" Tolerates rosuvastatin  . Ace Inhibitors Other (See Comments)    REACTION: "not sure...think it made me drowsy all the time"    Current Outpatient Prescriptions  Medication Sig Dispense Refill  . azelastine (ASTELIN) 0.1 % nasal spray Place 2 sprays into both nostrils 2 (two) times daily. Use in each nostril as directed 30 mL 2  . busPIRone (BUSPAR) 10 MG tablet Take 1 tablet (10 mg total) by mouth 2 (two) times daily. 180 tablet 1  . carvedilol (COREG) 6.25 MG tablet Take 1 tablet (6.25 mg total) by mouth 2 (two) times daily. 180 tablet 1  . insulin aspart protamine - aspart (NOVOLOG MIX 70/30 FLEXPEN) (70-30) 100 UNIT/ML FlexPen Inject 0.1 mLs (10 Units total) into the skin 2 (two) times daily. 15 mL 3  . losartan (COZAAR) 50 MG tablet Take 1 tablet (50 mg total) by mouth daily. 90 tablet 1  . promethazine-dextromethorphan (PROMETHAZINE-DM) 6.25-15 MG/5ML syrup Take 5 mLs by mouth 4 (four) times daily as needed for cough. 240 mL 1  . RENVELA 800 MG tablet Take 1,600-4,000 mg by mouth 3 (three) times daily. 4000 mg (5 caps) with meals and 1600 mg (2 caps) with snacks  5  . rosuvastatin (CRESTOR) 20 MG tablet Take 1 tablet (20 mg total) by mouth daily. 90 tablet 1  . SENSIPAR 30 MG tablet Take 30 mg by mouth daily.     . sertraline (ZOLOFT) 25 MG tablet Take 1 tablet (25 mg total) by mouth daily. 90 tablet 1  . oxyCODONE-acetaminophen (ROXICET) 5-325 MG tablet Take 1 tablet by  mouth every 6 (six) hours as needed for severe pain. (Patient not taking: Reported on 06/27/2017) 8 tablet 0   No current facility-administered medications for this visit.      ROS:  See HPI  Physical Exam:  Vitals:   06/27/17 1315  BP: (!) 182/114  Pulse: 95  Resp: 14  Temp: 98.1 F (36.7 C)    Fistula duplex  Diameter 0.26-0.62  Depth 0.48-0.55  Incision:  Well healed radio cephalic incision, with palpable thrill at the anastomosis.  Proximal fistula with less palpable thrill due to depth and large girth of the forearm. Extremities:  Right hand motor, sensation and grip 5/5 equal B Right Tunneled catheter in place.  Assessment/Plan:  [name] who is s/p: Right radio cephalic fistula creation.  Poor functioning right tunneled catheter.  We will schedule her for a catheter exchange tomorrow 06/28/2017.  She will f/u in 6 weeks for a repeat fistula duplex.  She may require a transposition of the fistula or we may need to abandon it and move to a brachial cephalic verse basilic fistula.     Laurence Slate [facility]  PA-C [facility] [phone]  Clinic MD:  Dr Donzetta Matters

## 2017-06-27 NOTE — Progress Notes (Signed)
Pt has hx of mitral valve replacement, denies chest pain or sob. Pt is diabetic, type 2. No longer on medications. Last A1C was 5.7 on 06/07/17. States her fasting blood sugar is usually less than 125. Instructed pt to check her blood sugar in the AM when she gets up and every 2 hours prior to leaving for the hospital. If blood sugar is 70 or below, treat with 1/2 cup of clear juice (apple or cranberry) and recheck blood sugar 15 minutes after drinking juice. If blood sugar continues to be 70 or below, call the Short Stay department and ask to speak to a nurse. Pt voiced understanding.

## 2017-06-27 NOTE — Anesthesia Preprocedure Evaluation (Addendum)
Anesthesia Evaluation  Patient identified by MRN, date of birth, ID band Patient awake    Reviewed: Allergy & Precautions, NPO status , Patient's Chart, lab work & pertinent test results  History of Anesthesia Complications Negative for: history of anesthetic complications  Airway Mallampati: III  TM Distance: >3 FB Neck ROM: Full    Dental  (+) Dental Advisory Given   Pulmonary neg pulmonary ROS,    breath sounds clear to auscultation       Cardiovascular hypertension, Pt. on medications and Pt. on home beta blockers (-) angina+ Valvular Problems/Murmurs (S/P MVRepair)  Rhythm:Regular Rate:Normal  '16 ECHO: EF 55-60% Required ECMO s/p MVR   Neuro/Psych negative neurological ROS     GI/Hepatic negative GI ROS,   Endo/Other  diabetes (glu 142), Insulin DependentMorbid obesity  Renal/GU Dialysis and ESRFRenal disease (MWF, K+ 4.2)     Musculoskeletal   Abdominal (+) + obese,   Peds  Hematology negative hematology ROS (+)   Anesthesia Other Findings   Reproductive/Obstetrics                            Anesthesia Physical Anesthesia Plan  ASA: III  Anesthesia Plan: MAC   Post-op Pain Management:    Induction: Intravenous  PONV Risk Score and Plan: 2 and Ondansetron and Treatment may vary due to age or medical condition  Airway Management Planned: Natural Airway and Simple Face Mask  Additional Equipment:   Intra-op Plan:   Post-operative Plan:   Informed Consent: I have reviewed the patients History and Physical, chart, labs and discussed the procedure including the risks, benefits and alternatives for the proposed anesthesia with the patient or authorized representative who has indicated his/her understanding and acceptance.   Dental advisory given  Plan Discussed with: CRNA and Surgeon  Anesthesia Plan Comments: (Plan routine monitors, MAC)        Anesthesia Quick  Evaluation

## 2017-06-28 ENCOUNTER — Ambulatory Visit (HOSPITAL_COMMUNITY): Payer: 59

## 2017-06-28 ENCOUNTER — Ambulatory Visit (HOSPITAL_COMMUNITY)
Admission: RE | Admit: 2017-06-28 | Discharge: 2017-06-28 | Disposition: A | Discharge status: Home / Self Care | Payer: 59 | Source: Ambulatory Visit | Attending: Vascular Surgery | Admitting: Vascular Surgery

## 2017-06-28 ENCOUNTER — Ambulatory Visit (HOSPITAL_COMMUNITY): Payer: 59 | Admitting: Anesthesiology

## 2017-06-28 ENCOUNTER — Encounter (HOSPITAL_COMMUNITY)
Admission: RE | Disposition: A | Discharge status: Home / Self Care | Payer: Self-pay | Source: Ambulatory Visit | Attending: Vascular Surgery

## 2017-06-28 ENCOUNTER — Encounter (HOSPITAL_COMMUNITY): Payer: Self-pay | Admitting: Critical Care Medicine

## 2017-06-28 DIAGNOSIS — Z6838 Body mass index (BMI) 38.0-38.9, adult: Secondary | ICD-10-CM | POA: Insufficient documentation

## 2017-06-28 DIAGNOSIS — R011 Cardiac murmur, unspecified: Secondary | ICD-10-CM | POA: Diagnosis not present

## 2017-06-28 DIAGNOSIS — N186 End stage renal disease: Secondary | ICD-10-CM | POA: Insufficient documentation

## 2017-06-28 DIAGNOSIS — N185 Chronic kidney disease, stage 5: Secondary | ICD-10-CM | POA: Diagnosis not present

## 2017-06-28 DIAGNOSIS — Z95828 Presence of other vascular implants and grafts: Secondary | ICD-10-CM

## 2017-06-28 DIAGNOSIS — Z888 Allergy status to other drugs, medicaments and biological substances status: Secondary | ICD-10-CM | POA: Diagnosis not present

## 2017-06-28 DIAGNOSIS — Y828 Other medical devices associated with adverse incidents: Secondary | ICD-10-CM | POA: Insufficient documentation

## 2017-06-28 DIAGNOSIS — Z794 Long term (current) use of insulin: Secondary | ICD-10-CM | POA: Insufficient documentation

## 2017-06-28 DIAGNOSIS — T8249XA Other complication of vascular dialysis catheter, initial encounter: Secondary | ICD-10-CM | POA: Diagnosis present

## 2017-06-28 DIAGNOSIS — I120 Hypertensive chronic kidney disease with stage 5 chronic kidney disease or end stage renal disease: Secondary | ICD-10-CM | POA: Diagnosis not present

## 2017-06-28 DIAGNOSIS — Z952 Presence of prosthetic heart valve: Secondary | ICD-10-CM | POA: Diagnosis not present

## 2017-06-28 DIAGNOSIS — T829XXA Unspecified complication of cardiac and vascular prosthetic device, implant and graft, initial encounter: Secondary | ICD-10-CM

## 2017-06-28 DIAGNOSIS — E1122 Type 2 diabetes mellitus with diabetic chronic kidney disease: Secondary | ICD-10-CM | POA: Insufficient documentation

## 2017-06-28 DIAGNOSIS — Z79899 Other long term (current) drug therapy: Secondary | ICD-10-CM | POA: Insufficient documentation

## 2017-06-28 DIAGNOSIS — Z992 Dependence on renal dialysis: Secondary | ICD-10-CM | POA: Insufficient documentation

## 2017-06-28 HISTORY — PX: EXCHANGE OF A DIALYSIS CATHETER: SHX5818

## 2017-06-28 LAB — GLUCOSE, CAPILLARY
Glucose-Capillary: 142 mg/dL — ABNORMAL HIGH (ref 65–99)
Glucose-Capillary: 139 mg/dL — ABNORMAL HIGH (ref 65–99)

## 2017-06-28 LAB — POCT I-STAT 4, (NA,K, GLUC, HGB,HCT)
Glucose, Bld: 136 mg/dL — ABNORMAL HIGH (ref 65–99)
HCT: 39 % (ref 36.0–46.0)
Hemoglobin: 13.3 g/dL (ref 12.0–15.0)
Potassium: 4.2 mmol/L (ref 3.5–5.1)
Sodium: 137 mmol/L (ref 135–145)

## 2017-06-28 SURGERY — EXCHANGE OF A DIALYSIS CATHETER
Anesthesia: Monitor Anesthesia Care | Site: Neck

## 2017-06-28 MED ORDER — DEXTROSE 5 % IV SOLN
1.5000 g | INTRAVENOUS | Status: AC
  Administered 2017-06-28: 1.5 g via INTRAVENOUS
  Filled 2017-06-28: qty 1.5

## 2017-06-28 MED ORDER — FENTANYL CITRATE (PF) 250 MCG/5ML IJ SOLN
INTRAMUSCULAR | Status: DC | PRN
  Administered 2017-06-28 (×3): 25 ug via INTRAVENOUS

## 2017-06-28 MED ORDER — HEPARIN SODIUM (PORCINE) 1000 UNIT/ML IJ SOLN
INTRAMUSCULAR | Status: DC | PRN
  Administered 2017-06-28: 5000 [IU] via INTRAVENOUS

## 2017-06-28 MED ORDER — MEPERIDINE HCL 25 MG/ML IJ SOLN: 6.2500 mg | INTRAMUSCULAR | Status: DC | PRN

## 2017-06-28 MED ORDER — FENTANYL CITRATE (PF) 250 MCG/5ML IJ SOLN
INTRAMUSCULAR | Status: AC
  Filled 2017-06-28: qty 5

## 2017-06-28 MED ORDER — LIDOCAINE-EPINEPHRINE (PF) 1 %-1:200000 IJ SOLN
INTRAMUSCULAR | Status: AC
  Filled 2017-06-28: qty 30

## 2017-06-28 MED ORDER — MIDAZOLAM HCL 2 MG/2ML IJ SOLN
INTRAMUSCULAR | Status: AC
  Filled 2017-06-28: qty 2

## 2017-06-28 MED ORDER — SODIUM CHLORIDE 0.9 % IV SOLN
INTRAVENOUS | Status: DC | PRN
  Administered 2017-06-28: 08:00:00

## 2017-06-28 MED ORDER — PROPOFOL 10 MG/ML IV BOLUS
INTRAVENOUS | Status: AC
  Filled 2017-06-28: qty 20

## 2017-06-28 MED ORDER — 0.9 % SODIUM CHLORIDE (POUR BTL) OPTIME
TOPICAL | Status: DC | PRN
  Administered 2017-06-28: 1000 mL

## 2017-06-28 MED ORDER — PROMETHAZINE HCL 25 MG/ML IJ SOLN: 6.2500 mg | INTRAMUSCULAR | Status: DC | PRN

## 2017-06-28 MED ORDER — ONDANSETRON HCL 4 MG/2ML IJ SOLN
INTRAMUSCULAR | Status: DC | PRN
  Administered 2017-06-28: 4 mg via INTRAVENOUS

## 2017-06-28 MED ORDER — CARVEDILOL 3.125 MG PO TABS
ORAL_TABLET | ORAL | Status: AC
  Administered 2017-06-28: 6.25 mg
  Filled 2017-06-28: qty 2

## 2017-06-28 MED ORDER — LIDOCAINE-EPINEPHRINE (PF) 1 %-1:200000 IJ SOLN
INTRAMUSCULAR | Status: DC | PRN
  Administered 2017-06-28: 10 mL

## 2017-06-28 MED ORDER — SODIUM CHLORIDE 0.9 % IV SOLN
INTRAVENOUS | Status: DC
  Administered 2017-06-28: 07:00:00 via INTRAVENOUS

## 2017-06-28 MED ORDER — HEPARIN SODIUM (PORCINE) 1000 UNIT/ML IJ SOLN
INTRAMUSCULAR | Status: AC
  Filled 2017-06-28: qty 1

## 2017-06-28 MED ORDER — PROPOFOL 10 MG/ML IV BOLUS
INTRAVENOUS | Status: DC | PRN
  Administered 2017-06-28 (×2): 10 mg via INTRAVENOUS

## 2017-06-28 MED ORDER — CARVEDILOL 6.25 MG PO TABS
6.2500 mg | ORAL_TABLET | Freq: Two times a day (BID) | ORAL | Status: DC
  Filled 2017-06-28: qty 1

## 2017-06-28 MED ORDER — MIDAZOLAM HCL 2 MG/2ML IJ SOLN: 0.5000 mg | Freq: Once | INTRAMUSCULAR | Status: DC | PRN

## 2017-06-28 MED ORDER — MIDAZOLAM HCL 5 MG/5ML IJ SOLN
INTRAMUSCULAR | Status: DC | PRN
  Administered 2017-06-28: 2 mg via INTRAVENOUS

## 2017-06-28 MED ORDER — FENTANYL CITRATE (PF) 100 MCG/2ML IJ SOLN: 25.0000 ug | INTRAMUSCULAR | Status: DC | PRN

## 2017-06-28 SURGICAL SUPPLY — 37 items
BAG DECANTER FOR FLEXI CONT (MISCELLANEOUS) ×2 IMPLANT
BIOPATCH RED 1 DISK 7.0 (GAUZE/BANDAGES/DRESSINGS) ×2 IMPLANT
CATH PALINDROME REV 19CM (CATHETERS) ×2 IMPLANT
CATH PALINDROME RT-P 15FX19CM (CATHETERS) IMPLANT
CATH PALINDROME RT-P 15FX23CM (CATHETERS) IMPLANT
CATH PALINDROME RT-P 15FX28CM (CATHETERS) IMPLANT
CATH PALINDROME RT-P 15FX55CM (CATHETERS) IMPLANT
COVER PROBE W GEL 5X96 (DRAPES) ×2 IMPLANT
COVER SURGICAL LIGHT HANDLE (MISCELLANEOUS) ×2 IMPLANT
DERMABOND ADVANCED (GAUZE/BANDAGES/DRESSINGS) ×1
DERMABOND ADVANCED .7 DNX12 (GAUZE/BANDAGES/DRESSINGS) ×1 IMPLANT
DRAPE C-ARM 42X72 X-RAY (DRAPES) ×2 IMPLANT
DRAPE CHEST BREAST 15X10 FENES (DRAPES) ×2 IMPLANT
GAUZE SPONGE 2X2 8PLY STRL LF (GAUZE/BANDAGES/DRESSINGS) IMPLANT
GAUZE SPONGE 4X4 16PLY XRAY LF (GAUZE/BANDAGES/DRESSINGS) ×2 IMPLANT
GLOVE BIO SURGEON STRL SZ7.5 (GLOVE) ×2 IMPLANT
GOWN STRL REUS W/ TWL LRG LVL3 (GOWN DISPOSABLE) ×1 IMPLANT
GOWN STRL REUS W/ TWL XL LVL3 (GOWN DISPOSABLE) ×1 IMPLANT
GOWN STRL REUS W/TWL LRG LVL3 (GOWN DISPOSABLE) ×1
GOWN STRL REUS W/TWL XL LVL3 (GOWN DISPOSABLE) ×1
KIT BASIN OR (CUSTOM PROCEDURE TRAY) ×2 IMPLANT
KIT ROOM TURNOVER OR (KITS) ×2 IMPLANT
NEEDLE 18GX1X1/2 (RX/OR ONLY) (NEEDLE) ×2 IMPLANT
NEEDLE HYPO 25GX1X1/2 BEV (NEEDLE) ×2 IMPLANT
NS IRRIG 1000ML POUR BTL (IV SOLUTION) ×2 IMPLANT
PACK SURGICAL SETUP 50X90 (CUSTOM PROCEDURE TRAY) ×2 IMPLANT
PAD ARMBOARD 7.5X6 YLW CONV (MISCELLANEOUS) ×4 IMPLANT
SOAP 2 % CHG 4 OZ (WOUND CARE) ×2 IMPLANT
SPONGE GAUZE 2X2 STER 10/PKG (GAUZE/BANDAGES/DRESSINGS)
SUT ETHILON 3 0 PS 1 (SUTURE) ×2 IMPLANT
SUT MNCRL AB 4-0 PS2 18 (SUTURE) ×2 IMPLANT
SYR 10ML LL (SYRINGE) ×2 IMPLANT
SYR 20CC LL (SYRINGE) ×4 IMPLANT
SYR 5ML LL (SYRINGE) ×2 IMPLANT
SYR CONTROL 10ML LL (SYRINGE) ×2 IMPLANT
WATER STERILE IRR 1000ML POUR (IV SOLUTION) ×2 IMPLANT
WIRE AMPLATZ SS-J .035X180CM (WIRE) ×2 IMPLANT

## 2017-06-28 NOTE — Anesthesia Postprocedure Evaluation (Signed)
Anesthesia Post Note  Patient: Jody Nguyen  Procedure(s) Performed: Procedure(s) (LRB): EXCHANGE OF A DIALYSIS CATHETER (N/A)     Patient location during evaluation: PACU Anesthesia Type: MAC Level of consciousness: awake and alert, patient cooperative and oriented Pain management: pain level controlled Vital Signs Assessment: post-procedure vital signs reviewed and stable Respiratory status: spontaneous breathing, nonlabored ventilation and respiratory function stable Cardiovascular status: blood pressure returned to baseline and stable Postop Assessment: no signs of nausea or vomiting Anesthetic complications: no    Last Vitals:  Vitals:   06/28/17 0825 06/28/17 0840  BP: (!) 145/91 (!) 144/85  Pulse: 77 77  Resp: 14 20  Temp:  36.6 C    Last Pain:  Vitals:   06/28/17 0601  TempSrc: Oral                 Brittanyann Wittner,E. Lorie Cleckley

## 2017-06-28 NOTE — H&P (Signed)
   History and Physical Update  The patient was interviewed and re-examined.  The patient's previous History and Physical has been reviewed and is unchanged from office visit yesterday. Plan for tdc exchange today.   Kazimir Hartnett C. Donzetta Matters, MD [facility] [place] Office: [phone] Pager: [phone]   06/28/2017, 7:16 AM

## 2017-06-28 NOTE — Transfer of Care (Signed)
Immediate Anesthesia Transfer of Care Note  Patient: Jody Nguyen  Procedure(s) Performed: Procedure(s): EXCHANGE OF A DIALYSIS CATHETER (N/A)  Patient Location: PACU  Anesthesia Type:MAC  Level of Consciousness: awake, alert  and oriented  Airway & Oxygen Therapy: Patient Spontanous Breathing and Patient connected to face mask oxygen  Post-op Assessment: Report given to RN and Post -op Vital signs reviewed and stable  Post vital signs: Reviewed and stable  Last Vitals:  Vitals:   06/28/17 0601  BP: (!) 193/99  Pulse: 86  Resp: 16  Temp: 36.7 C    Last Pain:  Vitals:   06/28/17 0601  TempSrc: Oral      Patients Stated Pain Goal: 3 (36/12/24 4975)  Complications: No apparent anesthesia complications

## 2017-06-28 NOTE — Op Note (Signed)
    Patient name: Jody Nguyen MRN: [id number] DOB: [birth year] Sex: female  06/28/2017 Pre-operative Diagnosis: esrd Post-operative diagnosis:  Same Surgeon:  Erlene Quan C. Donzetta Matters, MD Procedure Performed: Exchange right IJ tunneled dialysis catheter to 19cm diatek  Indications:  61 year old female on dialysis via right IJ catheter. Recently she has experienced difficulty with dialysis via the catheter and has an immature right radiocephalic AV fistula. She is indicated for catheter exchange versus new catheter.  Findings: A new catheter was exchanged terminating in the terminal SVC and affixed laterally on the right chest wall. Smooth transition and flushed and withdrew blood easily.   Procedure:  The patient was identified in the holding area and taken to  the operating room where she was placed supine on the operative table and Mac anesthesia induced. She was sterilely prepped and draped in the chest and neck in usual fashion given antibiotics and timeout called. We began by fluoroscopically identifying her previous catheter. This was then wired with a Amplatz wire into the IVC. The previous cuff was dissected out and the catheter was removed over the wire. A new 19 cm catheter was then tunneled over the wire with the cuff placed under the skin. This was trimmed to size and then assembled after the Amplatz wire was removed. It did flush and withdraw easily. Fluoroscopy demonstrated termination in the SVC with smooth transition over the clavicle. Satisfied with this was affixed with 3-0 nylon suture and flushed with heparin. Sterile dressing was placed. Patient tolerated procedure well without immediate complication.    Brandon C. Donzetta Matters, MD [facility] [place] Office: [phone] Pager: [phone]

## 2017-06-29 ENCOUNTER — Encounter (HOSPITAL_COMMUNITY): Payer: Self-pay | Admitting: Vascular Surgery

## 2017-07-27 DIAGNOSIS — Z992 Dependence on renal dialysis: Secondary | ICD-10-CM | POA: Diagnosis not present

## 2017-07-27 DIAGNOSIS — N2581 Secondary hyperparathyroidism of renal origin: Secondary | ICD-10-CM | POA: Diagnosis not present

## 2017-07-27 DIAGNOSIS — N186 End stage renal disease: Secondary | ICD-10-CM | POA: Diagnosis not present

## 2017-07-27 DIAGNOSIS — E1129 Type 2 diabetes mellitus with other diabetic kidney complication: Secondary | ICD-10-CM | POA: Diagnosis not present

## 2017-07-30 DIAGNOSIS — D631 Anemia in chronic kidney disease: Secondary | ICD-10-CM | POA: Diagnosis not present

## 2017-07-30 DIAGNOSIS — N2581 Secondary hyperparathyroidism of renal origin: Secondary | ICD-10-CM | POA: Diagnosis not present

## 2017-07-30 DIAGNOSIS — N186 End stage renal disease: Secondary | ICD-10-CM | POA: Diagnosis not present

## 2017-07-30 DIAGNOSIS — E118 Type 2 diabetes mellitus with unspecified complications: Secondary | ICD-10-CM | POA: Diagnosis not present

## 2017-08-01 ENCOUNTER — Other Ambulatory Visit: Payer: Self-pay | Admitting: *Deleted

## 2017-08-01 DIAGNOSIS — N186 End stage renal disease: Secondary | ICD-10-CM | POA: Diagnosis not present

## 2017-08-01 DIAGNOSIS — N2581 Secondary hyperparathyroidism of renal origin: Secondary | ICD-10-CM | POA: Diagnosis not present

## 2017-08-01 DIAGNOSIS — D631 Anemia in chronic kidney disease: Secondary | ICD-10-CM | POA: Diagnosis not present

## 2017-08-01 DIAGNOSIS — E118 Type 2 diabetes mellitus with unspecified complications: Secondary | ICD-10-CM | POA: Diagnosis not present

## 2017-08-01 MED ORDER — CINACALCET HCL 60 MG PO TABS: 60.0000 mg | ORAL_TABLET | Freq: Every day | ORAL | 1 refills | Status: DC

## 2017-08-03 DIAGNOSIS — D631 Anemia in chronic kidney disease: Secondary | ICD-10-CM | POA: Diagnosis not present

## 2017-08-03 DIAGNOSIS — E118 Type 2 diabetes mellitus with unspecified complications: Secondary | ICD-10-CM | POA: Diagnosis not present

## 2017-08-03 DIAGNOSIS — N2581 Secondary hyperparathyroidism of renal origin: Secondary | ICD-10-CM | POA: Diagnosis not present

## 2017-08-03 DIAGNOSIS — N186 End stage renal disease: Secondary | ICD-10-CM | POA: Diagnosis not present

## 2017-08-06 ENCOUNTER — Encounter: Payer: Self-pay | Admitting: Vascular Surgery

## 2017-08-06 DIAGNOSIS — N2581 Secondary hyperparathyroidism of renal origin: Secondary | ICD-10-CM | POA: Diagnosis not present

## 2017-08-06 DIAGNOSIS — N186 End stage renal disease: Secondary | ICD-10-CM | POA: Diagnosis not present

## 2017-08-06 DIAGNOSIS — D631 Anemia in chronic kidney disease: Secondary | ICD-10-CM | POA: Diagnosis not present

## 2017-08-06 DIAGNOSIS — E118 Type 2 diabetes mellitus with unspecified complications: Secondary | ICD-10-CM | POA: Diagnosis not present

## 2017-08-08 ENCOUNTER — Ambulatory Visit (INDEPENDENT_AMBULATORY_CARE_PROVIDER_SITE_OTHER): Payer: Self-pay | Admitting: Vascular Surgery

## 2017-08-08 ENCOUNTER — Encounter: Payer: Self-pay | Admitting: Vascular Surgery

## 2017-08-08 ENCOUNTER — Ambulatory Visit (HOSPITAL_COMMUNITY)
Admission: RE | Admit: 2017-08-08 | Discharge: 2017-08-08 | Disposition: A | Discharge status: Home / Self Care | Payer: Medicare Other | Source: Ambulatory Visit | Attending: Vascular Surgery | Admitting: Vascular Surgery

## 2017-08-08 ENCOUNTER — Other Ambulatory Visit: Payer: Self-pay

## 2017-08-08 VITALS — BP 137/79 | HR 94 | Temp 98.2°F | Resp 16 | Ht 59.5 in | Wt 193.0 lb

## 2017-08-08 DIAGNOSIS — Z992 Dependence on renal dialysis: Secondary | ICD-10-CM

## 2017-08-08 DIAGNOSIS — N2581 Secondary hyperparathyroidism of renal origin: Secondary | ICD-10-CM | POA: Diagnosis not present

## 2017-08-08 DIAGNOSIS — E118 Type 2 diabetes mellitus with unspecified complications: Secondary | ICD-10-CM | POA: Diagnosis not present

## 2017-08-08 DIAGNOSIS — N186 End stage renal disease: Secondary | ICD-10-CM | POA: Insufficient documentation

## 2017-08-08 DIAGNOSIS — D631 Anemia in chronic kidney disease: Secondary | ICD-10-CM | POA: Diagnosis not present

## 2017-08-08 DIAGNOSIS — Z48812 Encounter for surgical aftercare following surgery on the circulatory system: Secondary | ICD-10-CM | POA: Diagnosis not present

## 2017-08-08 DIAGNOSIS — I770 Arteriovenous fistula, acquired: Secondary | ICD-10-CM

## 2017-08-08 NOTE — Progress Notes (Signed)
Patient ID: Jody Nguyen, female   DOB: [birth year], 62 y.o.   MRN: [id number]  Reason for Consult: Routine Post Op   Referred by Unk Pinto, MD  Subjective:     HPI:  Jody Nguyen is a 61 y.o. female with history end-stage renal disease currently on dialysis via right IJ catheter. She has a right arm AV fistula placed in June this is been on able to be used by dialysis yet. She is not having any pain in her hand. Overall she has Nguyen complaints related to today's visit.  Past Medical History:  Diagnosis Date  . Anemia   . Chronic diastolic CHF (congestive heart failure) ([place])   . Chronic diastolic heart failure ([place])   . CKD (chronic kidney disease) stage 4, GFR 15-29 ml/min (HCC)    dialysis M/W/F  . Constipation   . CVA (cerebral infarction) 1997   Nguyen residual deficit  . Diverticulitis   . DM (diabetes mellitus) ([place])    type 2  . Heart murmur   . History of cardiovascular stress test    a. Myoview Oct 2012 showed EF 49%, Nguyen ischemia, LVE  . HLD (hyperlipidemia)    takes Crestor daily  . Hypertension   . Pericardial effusion    chronic; felt to be poss related to minoxidil >> DC'd  . Pulmonary hypertension ([place]) 06/18/2015  . Respiratory failure, acute hypoxic, post-operative 07/08/2015   Requiring ECMO support  . S/P cardiac catheterization    a. R/L HC 06/18/15:  mLAD 30%; severe pulmo HTN with PA sat 43%, CI 1.86, prominent V waves indicative of MR; resting hypoxemia O2 sat 86% on RA  . S/P minimally invasive mitral valve repair 07/08/2015   Complex valvuloplasty including artificial Gore-tex neochord placement x10 and 26 mm Sorin Memo 3D Rechord ring annuloplasty via right mini thoracotomy approach  . Severe mitral regurgitation   . Shortness of breath dyspnea   . Stroke ([place]) 1997   Nguyen residual effect  . Vitamin D deficiency   . Wears glasses    Family History  Problem Relation Age of Onset  . Cancer Sister        breast cancer  . Stroke Mother   .  Heart attack Brother        MI in his 36s  . Heart disease Brother        before age 64  . Breast cancer Sister    Past Surgical History:  Procedure Laterality Date  . AV FISTULA PLACEMENT Left 10/22/2015   Procedure: RADIOCEPHALIC ARTERIOVENOUS (AV) FISTULA CREATION;  Surgeon: Serafina Mitchell, MD;  Location: [facility];  Service: Vascular;  Laterality: Left;  . AV FISTULA PLACEMENT Right 05/22/2017   Procedure: ARTERIOVENOUS (AV) FISTULA CREATION-RIGHT ARM;  Surgeon: Waynetta Sandy, MD;  Location: [facility];  Service: Vascular;  Laterality: Right;  . CANNULATION FOR CARDIOPULMONARY BYPASS N/A 07/08/2015   Procedure: CANNULATION FOR ECMO;  Surgeon: Rexene Alberts, MD;  Location: [facility] [place] [facility];  Service: Open Heart Surgery;  Laterality: N/A;  . CARDIAC CATHETERIZATION    . CARDIAC CATHETERIZATION N/A 06/18/2015   Procedure: Right/Left Heart Cath and Coronary Angiography;  Surgeon: Jettie Booze, MD;  Location: [facility];  Service: Cardiovascular;  Laterality: N/A;  . CESAREAN SECTION     x 2  . COLONOSCOPY    . EXCHANGE OF A DIALYSIS CATHETER N/A 06/28/2017   Procedure: EXCHANGE OF A DIALYSIS CATHETER;  Surgeon: Waynetta Sandy, MD;  [facility];  Service: Vascular;  Laterality: N/A;  . FISTULA SUPERFICIALIZATION Left 12/28/2015   Procedure: SUPERFICIALIZATION LEFT RADIOCEPHALIC FISTULA;  Surgeon: Serafina Mitchell, MD;  Location: [facility];  Service: Vascular;  Laterality: Left;  . INSERTION OF DIALYSIS CATHETER Right 04/28/2017   Procedure: INSERTION OF DIALYSIS CATHETER-RIGHT INTERNAL JUGULAR PLACEMENT;  Surgeon: Elam Dutch, MD;  Location: [facility];  Service: Vascular;  Laterality: Right;  . LIGATION OF ARTERIOVENOUS  FISTULA Left 04/28/2017   Procedure: LIGATION OF ARTERIOVENOUS  FISTULA;  Surgeon: Elam Dutch, MD;  Location: [facility];  Service: Vascular;  Laterality: Left;  . MITRAL VALVE REPAIR Right 07/08/2015   Procedure: MINIMALLY INVASIVE MITRAL VALVE REPAIR with a  26 Sorin Memo 3D Rechord;  Surgeon: Rexene Alberts, MD;  Location: [facility];  Service: Open Heart Surgery;  Laterality: Right;  . MULTIPLE EXTRACTIONS WITH ALVEOLOPLASTY N/A 06/30/2015   Procedure: MULTIPLE EXTRACTIONS OF TOOTH #'S 4  AND 30 WITH ALVEOLOPLASTY AND GROSS DEBRIDEMENT  OF REMAINING TEETH;  Surgeon: Lenn Cal, DDS;  Location: [facility];  Service: Oral Surgery;  Laterality: N/A;  . PERIPHERAL VASCULAR CATHETERIZATION Left 03/28/2016   Procedure: A/V Fistulagram;  Surgeon: Serafina Mitchell, MD;  Location: [facility];  Service: Cardiovascular;  Laterality: Left;  lower arm  . TEE WITHOUT CARDIOVERSION N/A 06/08/2015   Procedure: TRANSESOPHAGEAL ECHOCARDIOGRAM (TEE);  Surgeon: Lelon Perla, MD;  Location: [facility] END[facility];  Service: Cardiovascular;  Laterality: N/A;  . TEE WITHOUT CARDIOVERSION N/A 07/08/2015   Procedure: TRANSESOPHAGEAL ECHOCARDIOGRAM (TEE);  Surgeon: Rexene Alberts, MD;  Location: [facility];  Service: Open Heart Surgery;  Laterality: N/A;    Short Social History:  Social History  Substance Use Topics  . Smoking status: Never Smoker  . Smokeless tobacco: Never Used  . Alcohol use Nguyen    Allergies  Allergen Reactions  . Minoxidil Other (See Comments)    Pericardial effusion  . Lipitor [Atorvastatin] Other (See Comments)    MYALGIAS > "pain in legs" Tolerates rosuvastatin  . Ace Inhibitors Other (See Comments)    REACTION: "not sure...think it made me drowsy all the time"    Current Outpatient Prescriptions  Medication Sig Dispense Refill  . azelastine (ASTELIN) 0.1 % nasal spray Place 2 sprays into both nostrils 2 (two) times daily. Use in each nostril as directed 30 mL 2  . busPIRone (BUSPAR) 10 MG tablet Take 1 tablet (10 mg total) by mouth 2 (two) times daily. 180 tablet 1  . carvedilol (COREG) 6.25 MG tablet Take 1 tablet (6.25 mg total) by mouth 2 (two) times daily. 180 tablet 1  . cinacalcet (SENSIPAR) 60 MG tablet Take 1 tablet (60 mg total) by mouth  daily. 30 tablet 1  . insulin aspart protamine - aspart (NOVOLOG MIX 70/30 FLEXPEN) (70-30) 100 UNIT/ML FlexPen Inject 0.1 mLs (10 Units total) into the skin 2 (two) times daily. (Patient taking differently: Inject 10 Units into the skin 2 (two) times daily as needed (if blood sugar is above 180). ) 15 mL 3  . losartan (COZAAR) 50 MG tablet Take 1 tablet (50 mg total) by mouth daily. 90 tablet 1  . promethazine-dextromethorphan (PROMETHAZINE-DM) 6.25-15 MG/5ML syrup Take 5 mLs by mouth 4 (four) times daily as needed for cough. 240 mL 1  . RENVELA 800 MG tablet Take 1,600-4,000 mg by mouth 3 (three) times daily. 4000 mg (5 caps) with meals and 1600 mg (2 caps) with snacks  5  . rosuvastatin (  CRESTOR) 20 MG tablet Take 1 tablet (20 mg total) by mouth daily. 90 tablet 1  . sertraline (ZOLOFT) 25 MG tablet Take 1 tablet (25 mg total) by mouth daily. 90 tablet 1   Nguyen current facility-administered medications for this visit.     Review of Systems  Constitutional:  Constitutional negative. Respiratory: Respiratory negative.  Cardiovascular: Cardiovascular negative.  GI: Gastrointestinal negative.  Musculoskeletal: Musculoskeletal negative.  Skin: Skin negative.        Objective:  Objective   Vitals:   08/08/17 1143  BP: 137/79  Pulse: 94  Resp: 16  Temp: 98.2 F (36.8 C)  SpO2: 94%  Weight: 193 lb (87.5 kg)  Height: 4' 11.5" (1.511 m)   Body mass index is 38.33 kg/m.  Physical Exam  Constitutional: She is oriented to person, place, and time. She appears well-developed.  HENT:  Head: Normocephalic.  Cardiovascular: Normal rate.   Pulmonary/Chest: Effort normal.  Musculoskeletal: Normal range of motion.  Right arm palpable thrill in forearm  Neurological: She is alert and oriented to person, place, and time.  Skin: Skin is warm and dry.  Psychiatric: She has a normal mood and affect. Her behavior is normal. Judgment and thought content normal.    Data: I have independently.  Her dialysis duplex which demonstrates flow volumes 1006 mL/m with diameter ranging between 0.56 and 0.84 cm.  Assessment/Plan:     61 year old female with end-stage renal disease currently on dialysis right IJ catheter. She has a fistula in the right forearm with palpable thrill and is significant size with significant flow volume for use however may be too deep. We will proceed with fistula gram on a nondialysis day in the near future to plan possible superficial is a patient versus conversion to an upper arm fistula. She demonstrates good understanding and we'll get her scheduled today.    Waynetta Sandy MD [facility]

## 2017-08-10 DIAGNOSIS — E118 Type 2 diabetes mellitus with unspecified complications: Secondary | ICD-10-CM | POA: Diagnosis not present

## 2017-08-10 DIAGNOSIS — D631 Anemia in chronic kidney disease: Secondary | ICD-10-CM | POA: Diagnosis not present

## 2017-08-10 DIAGNOSIS — N2581 Secondary hyperparathyroidism of renal origin: Secondary | ICD-10-CM | POA: Diagnosis not present

## 2017-08-10 DIAGNOSIS — N186 End stage renal disease: Secondary | ICD-10-CM | POA: Diagnosis not present

## 2017-08-13 DIAGNOSIS — N2581 Secondary hyperparathyroidism of renal origin: Secondary | ICD-10-CM | POA: Diagnosis not present

## 2017-08-13 DIAGNOSIS — E118 Type 2 diabetes mellitus with unspecified complications: Secondary | ICD-10-CM | POA: Diagnosis not present

## 2017-08-13 DIAGNOSIS — D631 Anemia in chronic kidney disease: Secondary | ICD-10-CM | POA: Diagnosis not present

## 2017-08-13 DIAGNOSIS — N186 End stage renal disease: Secondary | ICD-10-CM | POA: Diagnosis not present

## 2017-08-15 DIAGNOSIS — E118 Type 2 diabetes mellitus with unspecified complications: Secondary | ICD-10-CM | POA: Diagnosis not present

## 2017-08-15 DIAGNOSIS — N186 End stage renal disease: Secondary | ICD-10-CM | POA: Diagnosis not present

## 2017-08-15 DIAGNOSIS — N2581 Secondary hyperparathyroidism of renal origin: Secondary | ICD-10-CM | POA: Diagnosis not present

## 2017-08-15 DIAGNOSIS — D631 Anemia in chronic kidney disease: Secondary | ICD-10-CM | POA: Diagnosis not present

## 2017-08-16 ENCOUNTER — Ambulatory Visit (HOSPITAL_COMMUNITY)
Admission: RE | Admit: 2017-08-16 | Discharge: 2017-08-16 | Disposition: A | Discharge status: Home / Self Care | Payer: Medicare Other | Source: Ambulatory Visit | Attending: Vascular Surgery | Admitting: Vascular Surgery

## 2017-08-16 ENCOUNTER — Encounter (HOSPITAL_COMMUNITY)
Admission: RE | Disposition: A | Discharge status: Home / Self Care | Payer: Self-pay | Source: Ambulatory Visit | Attending: Vascular Surgery

## 2017-08-16 ENCOUNTER — Other Ambulatory Visit: Payer: Self-pay | Admitting: *Deleted

## 2017-08-16 ENCOUNTER — Encounter (HOSPITAL_COMMUNITY): Payer: Self-pay | Admitting: Vascular Surgery

## 2017-08-16 DIAGNOSIS — Z794 Long term (current) use of insulin: Secondary | ICD-10-CM | POA: Diagnosis not present

## 2017-08-16 DIAGNOSIS — Y9289 Other specified places as the place of occurrence of the external cause: Secondary | ICD-10-CM | POA: Insufficient documentation

## 2017-08-16 DIAGNOSIS — I130 Hypertensive heart and chronic kidney disease with heart failure and stage 1 through stage 4 chronic kidney disease, or unspecified chronic kidney disease: Secondary | ICD-10-CM | POA: Diagnosis not present

## 2017-08-16 DIAGNOSIS — E785 Hyperlipidemia, unspecified: Secondary | ICD-10-CM | POA: Insufficient documentation

## 2017-08-16 DIAGNOSIS — Z79899 Other long term (current) drug therapy: Secondary | ICD-10-CM | POA: Insufficient documentation

## 2017-08-16 DIAGNOSIS — I5032 Chronic diastolic (congestive) heart failure: Secondary | ICD-10-CM | POA: Diagnosis not present

## 2017-08-16 DIAGNOSIS — T82898A Other specified complication of vascular prosthetic devices, implants and grafts, initial encounter: Secondary | ICD-10-CM | POA: Insufficient documentation

## 2017-08-16 DIAGNOSIS — Z8673 Personal history of transient ischemic attack (TIA), and cerebral infarction without residual deficits: Secondary | ICD-10-CM | POA: Diagnosis not present

## 2017-08-16 DIAGNOSIS — E1122 Type 2 diabetes mellitus with diabetic chronic kidney disease: Secondary | ICD-10-CM | POA: Diagnosis not present

## 2017-08-16 DIAGNOSIS — Z992 Dependence on renal dialysis: Secondary | ICD-10-CM | POA: Diagnosis not present

## 2017-08-16 DIAGNOSIS — Z888 Allergy status to other drugs, medicaments and biological substances status: Secondary | ICD-10-CM | POA: Diagnosis not present

## 2017-08-16 DIAGNOSIS — N184 Chronic kidney disease, stage 4 (severe): Secondary | ICD-10-CM | POA: Diagnosis not present

## 2017-08-16 DIAGNOSIS — Y832 Surgical operation with anastomosis, bypass or graft as the cause of abnormal reaction of the patient, or of later complication, without mention of misadventure at the time of the procedure: Secondary | ICD-10-CM | POA: Insufficient documentation

## 2017-08-16 HISTORY — PX: A/V FISTULAGRAM: CATH118298

## 2017-08-16 LAB — GLUCOSE, CAPILLARY: Glucose-Capillary: 169 mg/dL — ABNORMAL HIGH (ref 65–99)

## 2017-08-16 LAB — POCT I-STAT, CHEM 8
BUN: 35 mg/dL — ABNORMAL HIGH (ref 6–20)
Calcium, Ion: 1.21 mmol/L (ref 1.15–1.40)
Chloride: 95 mmol/L — ABNORMAL LOW (ref 101–111)
Creatinine, Ser: 5.7 mg/dL — ABNORMAL HIGH (ref 0.44–1.00)
Glucose, Bld: 163 mg/dL — ABNORMAL HIGH (ref 65–99)
HCT: 39 % (ref 36.0–46.0)
Hemoglobin: 13.3 g/dL (ref 12.0–15.0)
Potassium: 3.9 mmol/L (ref 3.5–5.1)
Sodium: 139 mmol/L (ref 135–145)
TCO2: 34 mmol/L — ABNORMAL HIGH (ref 22–32)

## 2017-08-16 SURGERY — A/V FISTULAGRAM
Anesthesia: LOCAL | Laterality: Right

## 2017-08-16 MED ORDER — LIDOCAINE HCL 2 % IJ SOLN
INTRAMUSCULAR | Status: AC
  Filled 2017-08-16: qty 10

## 2017-08-16 MED ORDER — HEPARIN (PORCINE) IN NACL 2-0.9 UNIT/ML-% IJ SOLN
INTRAMUSCULAR | Status: AC | PRN
  Administered 2017-08-16: 500 mL

## 2017-08-16 MED ORDER — IODIXANOL 320 MG/ML IV SOLN
INTRAVENOUS | Status: DC | PRN
Start: 2017-08-16 — End: 2017-08-16
  Administered 2017-08-16: 30 mL via INTRAVENOUS

## 2017-08-16 MED ORDER — SODIUM CHLORIDE 0.9% FLUSH: 3.0000 mL | INTRAVENOUS | Status: DC | PRN

## 2017-08-16 MED ORDER — HEPARIN (PORCINE) IN NACL 2-0.9 UNIT/ML-% IJ SOLN
INTRAMUSCULAR | Status: AC
  Filled 2017-08-16: qty 500

## 2017-08-16 MED ORDER — LIDOCAINE HCL 2 % IJ SOLN
INTRAMUSCULAR | Status: DC | PRN
  Administered 2017-08-16: 2 mL via INTRADERMAL

## 2017-08-16 SURGICAL SUPPLY — 10 items
BAG SNAP BAND KOVER 36X36 (MISCELLANEOUS) ×4 IMPLANT
COVER DOME SNAP 22 D (MISCELLANEOUS) ×2 IMPLANT
COVER PRB 48X5XTLSCP FOLD TPE (BAG) ×1 IMPLANT
COVER PROBE 5X48 (BAG) ×1
KIT MICROINTRODUCER STIFF 5F (SHEATH) ×2 IMPLANT
PROTECTION STATION PRESSURIZED (MISCELLANEOUS) ×2
STATION PROTECTION PRESSURIZED (MISCELLANEOUS) ×1 IMPLANT
STOPCOCK MORSE 400PSI 3WAY (MISCELLANEOUS) ×2 IMPLANT
TRAY PV CATH (CUSTOM PROCEDURE TRAY) ×2 IMPLANT
TUBING CIL FLEX 10 FLL-RA (TUBING) ×2 IMPLANT

## 2017-08-16 NOTE — H&P (View-Only) (Signed)
Patient ID: Jody Nguyen, female   DOB: January 15, 1956, 61 y.o.   MRN: [id number]  Reason for Consult: Routine Post Op   Referred by Unk Pinto, MD  Subjective:     HPI:  Jody Nguyen is a 61 y.o. female with history end-stage renal disease currently on dialysis via right IJ catheter. She has a right arm AV fistula placed in June this is been on able to be used by dialysis yet. She is not having any pain in her hand. Overall she has no complaints related to today's visit.  Past Medical History:  Diagnosis Date  . Anemia   . Chronic diastolic CHF (congestive heart failure) ([place])   . Chronic diastolic heart failure ([place])   . CKD (chronic kidney disease) stage 4, GFR 15-29 ml/min (HCC)    dialysis M/W/F  . Constipation   . CVA (cerebral infarction) 1997   no residual deficit  . Diverticulitis   . DM (diabetes mellitus) ([place])    type 2  . Heart murmur   . History of cardiovascular stress test    a. Myoview Oct 2012 showed EF 49%, no ischemia, LVE  . HLD (hyperlipidemia)    takes Crestor daily  . Hypertension   . Pericardial effusion    chronic; felt to be poss related to minoxidil >> DC'd  . Pulmonary hypertension ([place]) 06/18/2015  . Respiratory failure, acute hypoxic, post-operative 07/08/2015   Requiring ECMO support  . S/P cardiac catheterization    a. R/L HC 06/18/15:  mLAD 30%; severe pulmo HTN with PA sat 43%, CI 1.86, prominent V waves indicative of MR; resting hypoxemia O2 sat 86% on RA  . S/P minimally invasive mitral valve repair 07/08/2015   Complex valvuloplasty including artificial Gore-tex neochord placement x10 and 26 mm Sorin Memo 3D Rechord ring annuloplasty via right mini thoracotomy approach  . Severe mitral regurgitation   . Shortness of breath dyspnea   . Stroke ([place]) 1997   no residual effect  . Vitamin D deficiency   . Wears glasses    Family History  Problem Relation Age of Onset  . Cancer Sister        breast cancer  . Stroke Mother   .  Heart attack Brother        MI in his 50s  . Heart disease Brother        before age 45  . Breast cancer Sister    Past Surgical History:  Procedure Laterality Date  . AV FISTULA PLACEMENT Left 10/22/2015   Procedure: RADIOCEPHALIC ARTERIOVENOUS (AV) FISTULA CREATION;  Surgeon: Serafina Mitchell, MD;  Location: [facility];  Service: Vascular;  Laterality: Left;  . AV FISTULA PLACEMENT Right 05/22/2017   Procedure: ARTERIOVENOUS (AV) FISTULA CREATION-RIGHT ARM;  Surgeon: Waynetta Sandy, MD;  Location: [facility];  Service: Vascular;  Laterality: Right;  . CANNULATION FOR CARDIOPULMONARY BYPASS N/A 07/08/2015   Procedure: CANNULATION FOR ECMO;  Surgeon: Rexene Alberts, MD;  Location: [facility];  Service: Open Heart Surgery;  Laterality: N/A;  . CARDIAC CATHETERIZATION    . CARDIAC CATHETERIZATION N/A 06/18/2015   Procedure: Right/Left Heart Cath and Coronary Angiography;  Surgeon: Jettie Booze, MD;  Location: [facility];  Service: Cardiovascular;  Laterality: N/A;  . CESAREAN SECTION     x 2  . COLONOSCOPY    . EXCHANGE OF A DIALYSIS CATHETER N/A 06/28/2017   Procedure: EXCHANGE OF A DIALYSIS CATHETER;  Surgeon: Waynetta Sandy, MD;  [facility];  Service: Vascular;  Laterality: N/A;  . FISTULA SUPERFICIALIZATION Left 12/28/2015   Procedure: SUPERFICIALIZATION LEFT RADIOCEPHALIC FISTULA;  Surgeon: Serafina Mitchell, MD;  Location: [facility];  Service: Vascular;  Laterality: Left;  . INSERTION OF DIALYSIS CATHETER Right 04/28/2017   Procedure: INSERTION OF DIALYSIS CATHETER-RIGHT INTERNAL JUGULAR PLACEMENT;  Surgeon: Elam Dutch, MD;  Location: [place] [facility];  Service: Vascular;  Laterality: Right;  . LIGATION OF ARTERIOVENOUS  FISTULA Left 04/28/2017   Procedure: LIGATION OF ARTERIOVENOUS  FISTULA;  Surgeon: Elam Dutch, MD;  Location: [facility];  Service: Vascular;  Laterality: Left;  . MITRAL VALVE REPAIR Right 07/08/2015   Procedure: MINIMALLY INVASIVE MITRAL VALVE REPAIR with a  26 Sorin Memo 3D Rechord;  Surgeon: Rexene Alberts, MD;  Location: [facility];  Service: Open Heart Surgery;  Laterality: Right;  . MULTIPLE EXTRACTIONS WITH ALVEOLOPLASTY N/A 06/30/2015   Procedure: MULTIPLE EXTRACTIONS OF TOOTH #'S 4  AND 30 WITH ALVEOLOPLASTY AND GROSS DEBRIDEMENT  OF REMAINING TEETH;  Surgeon: Lenn Cal, DDS;  Location: [facility];  Service: Oral Surgery;  Laterality: N/A;  . PERIPHERAL VASCULAR CATHETERIZATION Left 03/28/2016   Procedure: A/V Fistulagram;  Surgeon: Serafina Mitchell, MD;  Location: [facility];  Service: Cardiovascular;  Laterality: Left;  lower arm  . TEE WITHOUT CARDIOVERSION N/A 06/08/2015   Procedure: TRANSESOPHAGEAL ECHOCARDIOGRAM (TEE);  Surgeon: Lelon Perla, MD;  Location: [facility] ENDOSCOPY;  Service: Cardiovascular;  Laterality: N/A;  . TEE WITHOUT CARDIOVERSION N/A 07/08/2015   Procedure: TRANSESOPHAGEAL ECHOCARDIOGRAM (TEE);  Surgeon: Rexene Alberts, MD;  Location: [place];  Service: Open Heart Surgery;  Laterality: N/A;    Short Social History:  Social History  Substance Use Topics  . Smoking status: Never Smoker  . Smokeless tobacco: Never Used  . Alcohol use No    Allergies  Allergen Reactions  . Minoxidil Other (See Comments)    Pericardial effusion  . Lipitor [Atorvastatin] Other (See Comments)    MYALGIAS > "pain in legs" Tolerates rosuvastatin  . Ace Inhibitors Other (See Comments)    REACTION: "not sure...think it made me drowsy all the time"    Current Outpatient Prescriptions  Medication Sig Dispense Refill  . azelastine (ASTELIN) 0.1 % nasal spray Place 2 sprays into both nostrils 2 (two) times daily. Use in each nostril as directed 30 mL 2  . busPIRone (BUSPAR) 10 MG tablet Take 1 tablet (10 mg total) by mouth 2 (two) times daily. 180 tablet 1  . carvedilol (COREG) 6.25 MG tablet Take 1 tablet (6.25 mg total) by mouth 2 (two) times daily. 180 tablet 1  . cinacalcet (SENSIPAR) 60 MG tablet Take 1 tablet (60 mg total) by mouth  daily. 30 tablet 1  . insulin aspart protamine - aspart (NOVOLOG MIX 70/30 FLEXPEN) (70-30) 100 UNIT/ML FlexPen Inject 0.1 mLs (10 Units total) into the skin 2 (two) times daily. (Patient taking differently: Inject 10 Units into the skin 2 (two) times daily as needed (if blood sugar is above 180). ) 15 mL 3  . losartan (COZAAR) 50 MG tablet Take 1 tablet (50 mg total) by mouth daily. 90 tablet 1  . promethazine-dextromethorphan (PROMETHAZINE-DM) 6.25-15 MG/5ML syrup Take 5 mLs by mouth 4 (four) times daily as needed for cough. 240 mL 1  . RENVELA 800 MG tablet Take 1,600-4,000 mg by mouth 3 (three) times daily. 4000 mg (5 caps) with meals and 1600 mg (2 caps) with snacks  5  . rosuvastatin (  CRESTOR) 20 MG tablet Take 1 tablet (20 mg total) by mouth daily. 90 tablet 1  . sertraline (ZOLOFT) 25 MG tablet Take 1 tablet (25 mg total) by mouth daily. 90 tablet 1   No current facility-administered medications for this visit.     Review of Systems  Constitutional:  Constitutional negative. Respiratory: Respiratory negative.  Cardiovascular: Cardiovascular negative.  GI: Gastrointestinal negative.  Musculoskeletal: Musculoskeletal negative.  Skin: Skin negative.        Objective:  Objective   Vitals:   08/08/17 1143  BP: 137/79  Pulse: 94  Resp: 16  Temp: 98.2 F (36.8 C)  SpO2: 94%  Weight: 193 lb (87.5 kg)  Height: 4' 11.5" (1.511 m)   Body mass index is 38.33 kg/m.  Physical Exam  Constitutional: She is oriented to person, place, and time. She appears well-developed.  HENT:  Head: Normocephalic.  Cardiovascular: Normal rate.   Pulmonary/Chest: Effort normal.  Musculoskeletal: Normal range of motion.  Right arm palpable thrill in forearm  Neurological: She is alert and oriented to person, place, and time.  Skin: Skin is warm and dry.  Psychiatric: She has a normal mood and affect. Her behavior is normal. Judgment and thought content normal.    Data: I have independently.  Her dialysis duplex which demonstrates flow volumes 1006 mL/m with diameter ranging between 0.56 and 0.84 cm.  Assessment/Plan:     61 year old female with end-stage renal disease currently on dialysis right IJ catheter. She has a fistula in the right forearm with palpable thrill and is significant size with significant flow volume for use however may be too deep. We will proceed with fistula gram on a nondialysis day in the near future to plan possible superficial is a patient versus conversion to an upper arm fistula. She demonstrates good understanding and we'll get her scheduled today.    Waynetta Sandy MD [facility]

## 2017-08-16 NOTE — Op Note (Signed)
    OPERATIVE NOTE   PROCEDURE: 1.  right radiocephalic arteriovenous fistula cannulation under ultrasound guidance 2.  right arm shuntogram  PRE-OPERATIVE DIAGNOSIS: right radiocephalic arteriovenous fistula with poor maturation   POST-OPERATIVE DIAGNOSIS: same as above   SURGEON: Adele Barthel, MD  ANESTHESIA: local  ESTIMATED BLOOD LOSS: 50 cc  FINDING(S): 1. Patent fistula throughout with dominant drainage via upper arm cephalic vein: majority of vein >6 mm 2. Distal fistula is tapered with two significant side branches distally 3. Central venous structure are patent 4. On sonosite, fistula appears somewhat deep  SPECIMEN(S):  None  CONTRAST: 30 cc   INDICATIONS: Jody Nguyen is a 61 y.o. female who presents with right radiocephalic arteriovenous fistula with non-maturation.  The patient is scheduled for right arm shuntogram.  The patient is aware the risks include but are not limited to: bleeding, infection, thrombosis of the cannulated access, and possible anaphylactic reaction to the contrast.  The patient is aware of the risks of the procedure and elects to proceed forward.   DESCRIPTION: After full informed written consent was obtained, the patient was brought back to the angiography suite and placed supine upon the angiography table.  The patient was connected to monitoring equipment.  The right forearm was prepped and draped in the standard fashion for a right arm shuntogram.  Under ultrasound guidance, the right radiocephalic arteriovenous fistula was cannulated with a micropuncture needle.  The microwire was advanced into the fistula and the needle was exchanged for the a microsheath, which was lodged 2 cm into the access.  The wire was removed and the sheath was connected to the IV extension tubing.  Hand injections were completed to image the access from the cannulation site up the right atrium.  Based on the images, this patient will need: no immediate endovascular  intervention.  A 4-0 Monocryl purse-string suture was sewn around the sheath.  The sheath was removed while tying down the suture.  A sterile bandage was applied to the puncture site.  I suspect the fistula is just deep as it appears to be mature.  If flow rates are inadequate, consider ligating distal side branches as part of a superficialization procedure.   COMPLICATIONS: none  CONDITION: stable   Adele Barthel, MD, [facility] [place] Office: [phone] Pager: [phone]  08/16/2017 7:50 AM

## 2017-08-16 NOTE — Discharge Instructions (Signed)

## 2017-08-16 NOTE — Interval H&P Note (Signed)
History and Physical Interval Note:  08/16/2017 6:53 AM  Jody Nguyen  has presented today for surgery, with the diagnosis of instage renal  The various methods of treatment have been discussed with the patient and family. After consideration of risks, benefits and other options for treatment, the patient has consented to  Procedure(s): A/V Fistulagram (Right) as a surgical intervention .  The patient's history has been reviewed, patient examined, no change in status, stable for surgery.  I have reviewed the patient's chart and labs.  Questions were answered to the patient's satisfaction.     Adele Barthel

## 2017-08-17 DIAGNOSIS — N2581 Secondary hyperparathyroidism of renal origin: Secondary | ICD-10-CM | POA: Diagnosis not present

## 2017-08-17 DIAGNOSIS — D631 Anemia in chronic kidney disease: Secondary | ICD-10-CM | POA: Diagnosis not present

## 2017-08-17 DIAGNOSIS — E118 Type 2 diabetes mellitus with unspecified complications: Secondary | ICD-10-CM | POA: Diagnosis not present

## 2017-08-17 DIAGNOSIS — N186 End stage renal disease: Secondary | ICD-10-CM | POA: Diagnosis not present

## 2017-08-20 ENCOUNTER — Encounter (HOSPITAL_COMMUNITY): Payer: Self-pay | Admitting: *Deleted

## 2017-08-20 DIAGNOSIS — N186 End stage renal disease: Secondary | ICD-10-CM | POA: Diagnosis not present

## 2017-08-20 DIAGNOSIS — D631 Anemia in chronic kidney disease: Secondary | ICD-10-CM | POA: Diagnosis not present

## 2017-08-20 DIAGNOSIS — E118 Type 2 diabetes mellitus with unspecified complications: Secondary | ICD-10-CM | POA: Diagnosis not present

## 2017-08-20 DIAGNOSIS — N2581 Secondary hyperparathyroidism of renal origin: Secondary | ICD-10-CM | POA: Diagnosis not present

## 2017-08-20 MED ORDER — DEXTROSE 5 % IV SOLN
1.5000 g | INTRAVENOUS | Status: AC
  Administered 2017-08-21: 1.5 g via INTRAVENOUS
  Filled 2017-08-20: qty 1.5

## 2017-08-21 ENCOUNTER — Ambulatory Visit (HOSPITAL_COMMUNITY): Payer: Medicare Other | Admitting: Certified Registered Nurse Anesthetist

## 2017-08-21 ENCOUNTER — Encounter (HOSPITAL_COMMUNITY)
Admission: RE | Disposition: A | Discharge status: Home / Self Care | Payer: Self-pay | Source: Ambulatory Visit | Attending: Vascular Surgery

## 2017-08-21 ENCOUNTER — Ambulatory Visit (HOSPITAL_COMMUNITY)
Admission: RE | Admit: 2017-08-21 | Discharge: 2017-08-21 | Disposition: A | Discharge status: Home / Self Care | Payer: Medicare Other | Source: Ambulatory Visit | Attending: Vascular Surgery | Admitting: Vascular Surgery

## 2017-08-21 ENCOUNTER — Encounter (HOSPITAL_COMMUNITY): Payer: Self-pay | Admitting: Certified Registered Nurse Anesthetist

## 2017-08-21 ENCOUNTER — Telehealth: Payer: Self-pay | Admitting: Vascular Surgery

## 2017-08-21 DIAGNOSIS — Z992 Dependence on renal dialysis: Secondary | ICD-10-CM | POA: Insufficient documentation

## 2017-08-21 DIAGNOSIS — T82838A Hemorrhage of vascular prosthetic devices, implants and grafts, initial encounter: Secondary | ICD-10-CM | POA: Diagnosis not present

## 2017-08-21 DIAGNOSIS — N186 End stage renal disease: Secondary | ICD-10-CM | POA: Insufficient documentation

## 2017-08-21 DIAGNOSIS — Z6838 Body mass index (BMI) 38.0-38.9, adult: Secondary | ICD-10-CM | POA: Insufficient documentation

## 2017-08-21 DIAGNOSIS — E1122 Type 2 diabetes mellitus with diabetic chronic kidney disease: Secondary | ICD-10-CM | POA: Diagnosis not present

## 2017-08-21 DIAGNOSIS — I5032 Chronic diastolic (congestive) heart failure: Secondary | ICD-10-CM | POA: Diagnosis not present

## 2017-08-21 DIAGNOSIS — Y832 Surgical operation with anastomosis, bypass or graft as the cause of abnormal reaction of the patient, or of later complication, without mention of misadventure at the time of the procedure: Secondary | ICD-10-CM | POA: Diagnosis not present

## 2017-08-21 DIAGNOSIS — T82898A Other specified complication of vascular prosthetic devices, implants and grafts, initial encounter: Secondary | ICD-10-CM | POA: Insufficient documentation

## 2017-08-21 DIAGNOSIS — I132 Hypertensive heart and chronic kidney disease with heart failure and with stage 5 chronic kidney disease, or end stage renal disease: Secondary | ICD-10-CM | POA: Insufficient documentation

## 2017-08-21 DIAGNOSIS — I509 Heart failure, unspecified: Secondary | ICD-10-CM | POA: Insufficient documentation

## 2017-08-21 DIAGNOSIS — Z7984 Long term (current) use of oral hypoglycemic drugs: Secondary | ICD-10-CM | POA: Diagnosis not present

## 2017-08-21 DIAGNOSIS — D631 Anemia in chronic kidney disease: Secondary | ICD-10-CM | POA: Insufficient documentation

## 2017-08-21 HISTORY — PX: FISTULA SUPERFICIALIZATION: SHX6341

## 2017-08-21 LAB — GLUCOSE, CAPILLARY
Glucose-Capillary: 175 mg/dL — ABNORMAL HIGH (ref 65–99)
Glucose-Capillary: 135 mg/dL — ABNORMAL HIGH (ref 65–99)

## 2017-08-21 LAB — POCT I-STAT 4, (NA,K, GLUC, HGB,HCT)
Glucose, Bld: 169 mg/dL — ABNORMAL HIGH (ref 65–99)
HCT: 34 % — ABNORMAL LOW (ref 36.0–46.0)
Hemoglobin: 11.6 g/dL — ABNORMAL LOW (ref 12.0–15.0)
Potassium: 3.6 mmol/L (ref 3.5–5.1)
Sodium: 137 mmol/L (ref 135–145)

## 2017-08-21 SURGERY — FISTULA SUPERFICIALIZATION
Anesthesia: General | Site: Arm Lower | Laterality: Right

## 2017-08-21 MED ORDER — EPHEDRINE SULFATE-NACL 50-0.9 MG/10ML-% IV SOSY
PREFILLED_SYRINGE | INTRAVENOUS | Status: DC | PRN
  Administered 2017-08-21: 10 mg via INTRAVENOUS

## 2017-08-21 MED ORDER — STERILE WATER FOR IRRIGATION IR SOLN
Status: DC | PRN
  Administered 2017-08-21: 1000 mL

## 2017-08-21 MED ORDER — 0.9 % SODIUM CHLORIDE (POUR BTL) OPTIME
TOPICAL | Status: DC | PRN
  Administered 2017-08-21: 1000 mL

## 2017-08-21 MED ORDER — LIDOCAINE 2% (20 MG/ML) 5 ML SYRINGE
INTRAMUSCULAR | Status: AC
  Filled 2017-08-21: qty 5

## 2017-08-21 MED ORDER — MIDAZOLAM HCL 5 MG/5ML IJ SOLN
INTRAMUSCULAR | Status: DC | PRN
  Administered 2017-08-21: 1 mg via INTRAVENOUS

## 2017-08-21 MED ORDER — PHENYLEPHRINE 40 MCG/ML (10ML) SYRINGE FOR IV PUSH (FOR BLOOD PRESSURE SUPPORT)
PREFILLED_SYRINGE | INTRAVENOUS | Status: AC
  Filled 2017-08-21: qty 10

## 2017-08-21 MED ORDER — SODIUM CHLORIDE 0.9 % IV SOLN
INTRAVENOUS | Status: DC
  Administered 2017-08-21: 09:00:00 via INTRAVENOUS

## 2017-08-21 MED ORDER — PHENYLEPHRINE 40 MCG/ML (10ML) SYRINGE FOR IV PUSH (FOR BLOOD PRESSURE SUPPORT)
PREFILLED_SYRINGE | INTRAVENOUS | Status: DC | PRN
  Administered 2017-08-21: 120 ug via INTRAVENOUS
  Administered 2017-08-21 (×2): 80 ug via INTRAVENOUS
  Administered 2017-08-21: 120 ug via INTRAVENOUS

## 2017-08-21 MED ORDER — PROPOFOL 10 MG/ML IV BOLUS
INTRAVENOUS | Status: DC | PRN
  Administered 2017-08-21: 100 mg via INTRAVENOUS

## 2017-08-21 MED ORDER — DEXAMETHASONE SODIUM PHOSPHATE 10 MG/ML IJ SOLN
INTRAMUSCULAR | Status: AC
  Filled 2017-08-21: qty 1

## 2017-08-21 MED ORDER — EPHEDRINE SULFATE 50 MG/ML IJ SOLN
INTRAMUSCULAR | Status: AC
  Filled 2017-08-21: qty 1

## 2017-08-21 MED ORDER — ONDANSETRON HCL 4 MG/2ML IJ SOLN
INTRAMUSCULAR | Status: DC | PRN
  Administered 2017-08-21: 4 mg via INTRAVENOUS

## 2017-08-21 MED ORDER — SODIUM CHLORIDE 0.9 % IJ SOLN
INTRAMUSCULAR | Status: AC
  Filled 2017-08-21: qty 10

## 2017-08-21 MED ORDER — FENTANYL CITRATE (PF) 100 MCG/2ML IJ SOLN
INTRAMUSCULAR | Status: DC | PRN
  Administered 2017-08-21: 50 ug via INTRAVENOUS
  Administered 2017-08-21 (×3): 25 ug via INTRAVENOUS

## 2017-08-21 MED ORDER — MIDAZOLAM HCL 2 MG/2ML IJ SOLN
INTRAMUSCULAR | Status: AC
  Filled 2017-08-21: qty 2

## 2017-08-21 MED ORDER — ONDANSETRON HCL 4 MG/2ML IJ SOLN
INTRAMUSCULAR | Status: AC
  Filled 2017-08-21: qty 2

## 2017-08-21 MED ORDER — DEXTROSE 5 % IV SOLN
INTRAVENOUS | Status: DC | PRN
  Administered 2017-08-21: 25 ug/min via INTRAVENOUS

## 2017-08-21 MED ORDER — FENTANYL CITRATE (PF) 100 MCG/2ML IJ SOLN: 25.0000 ug | INTRAMUSCULAR | Status: DC | PRN

## 2017-08-21 MED ORDER — DEXAMETHASONE SODIUM PHOSPHATE 10 MG/ML IJ SOLN
INTRAMUSCULAR | Status: DC | PRN
  Administered 2017-08-21: 5 mg via INTRAVENOUS

## 2017-08-21 MED ORDER — OXYCODONE-ACETAMINOPHEN 5-325 MG PO TABS: 1.0000 | ORAL_TABLET | Freq: Four times a day (QID) | ORAL | 0 refills | Status: DC | PRN

## 2017-08-21 MED ORDER — FENTANYL CITRATE (PF) 250 MCG/5ML IJ SOLN
INTRAMUSCULAR | Status: AC
  Filled 2017-08-21: qty 5

## 2017-08-21 MED ORDER — PROPOFOL 10 MG/ML IV BOLUS
INTRAVENOUS | Status: AC
  Filled 2017-08-21: qty 20

## 2017-08-21 SURGICAL SUPPLY — 30 items
ARMBAND PINK RESTRICT EXTREMIT (MISCELLANEOUS) ×2 IMPLANT
CANISTER SUCT 3000ML PPV (MISCELLANEOUS) ×2 IMPLANT
CLIP VESOCCLUDE MED 6/CT (CLIP) ×2 IMPLANT
CLIP VESOCCLUDE SM WIDE 6/CT (CLIP) ×2 IMPLANT
COVER PROBE W GEL 5X96 (DRAPES) ×2 IMPLANT
DERMABOND ADVANCED (GAUZE/BANDAGES/DRESSINGS) ×1
DERMABOND ADVANCED .7 DNX12 (GAUZE/BANDAGES/DRESSINGS) ×1 IMPLANT
ELECT REM PT RETURN 9FT ADLT (ELECTROSURGICAL) ×2
ELECTRODE REM PT RTRN 9FT ADLT (ELECTROSURGICAL) ×1 IMPLANT
GLOVE BIO SURGEON STRL SZ7.5 (GLOVE) ×2 IMPLANT
GLOVE BIOGEL PI IND STRL 6.5 (GLOVE) ×1 IMPLANT
GLOVE BIOGEL PI IND STRL 8 (GLOVE) ×1 IMPLANT
GLOVE BIOGEL PI INDICATOR 6.5 (GLOVE) ×1
GLOVE BIOGEL PI INDICATOR 8 (GLOVE) ×1
GLOVE ECLIPSE 6.5 STRL STRAW (GLOVE) ×2 IMPLANT
GOWN STRL REUS W/ TWL LRG LVL3 (GOWN DISPOSABLE) ×1 IMPLANT
GOWN STRL REUS W/ TWL XL LVL3 (GOWN DISPOSABLE) ×1 IMPLANT
GOWN STRL REUS W/TWL LRG LVL3 (GOWN DISPOSABLE) ×1
GOWN STRL REUS W/TWL XL LVL3 (GOWN DISPOSABLE) ×1
KIT BASIN OR (CUSTOM PROCEDURE TRAY) ×2 IMPLANT
KIT ROOM TURNOVER OR (KITS) ×2 IMPLANT
NS IRRIG 1000ML POUR BTL (IV SOLUTION) ×2 IMPLANT
PACK CV ACCESS (CUSTOM PROCEDURE TRAY) ×2 IMPLANT
PAD ARMBOARD 7.5X6 YLW CONV (MISCELLANEOUS) ×4 IMPLANT
SUT MNCRL AB 4-0 PS2 18 (SUTURE) ×4 IMPLANT
SUT PROLENE 6 0 BV (SUTURE) ×2 IMPLANT
SUT VIC AB 3-0 SH 27 (SUTURE) ×3
SUT VIC AB 3-0 SH 27X BRD (SUTURE) ×3 IMPLANT
UNDERPAD 30X30 (UNDERPADS AND DIAPERS) ×2 IMPLANT
WATER STERILE IRR 1000ML POUR (IV SOLUTION) ×2 IMPLANT

## 2017-08-21 NOTE — Anesthesia Postprocedure Evaluation (Signed)
Anesthesia Post Note  Patient: Jody Nguyen  Procedure(s) Performed: Procedure(s) (LRB): FISTULA SUPERFICIALIZATION RIGHT ARM (Right)     Patient location during evaluation: PACU Anesthesia Type: General Level of consciousness: awake and alert Pain management: pain level controlled Vital Signs Assessment: post-procedure vital signs reviewed and stable Respiratory status: spontaneous breathing, nonlabored ventilation and respiratory function stable Cardiovascular status: blood pressure returned to baseline and stable Postop Assessment: no apparent nausea or vomiting Anesthetic complications: no    Last Vitals:  Vitals:   08/21/17 1130 08/21/17 1133  BP:  134/75  Pulse:  70  Resp:  11  Temp: 36.6 C   SpO2:  95%    Last Pain:  Vitals:   08/21/17 1130  PainSc: 0-No pain                 Philipp Callegari,W. EDMOND

## 2017-08-21 NOTE — Telephone Encounter (Signed)
-----   Message from Mena Goes, RN sent at 08/21/2017 10:59 AM EDT ----- Regarding: 2-3 weeks post op wound check w/ NP or PA   ----- Message ----- From: Waynetta Sandy, MD Sent: 08/21/2017  10:34 AM To: [address]  Jody Nguyen [id number] 1956-07-11  08/21/2017 Pre-operative Diagnosis: esrd  Surgeon:  Eda Paschal. Donzetta Matters, MD Assistant: OR nursing  Procedure Performed: superficialization and branch ligation of right arm radiocephalic av fistula  F/u in 2-3 weeks for wound check, can be np/pa

## 2017-08-21 NOTE — Op Note (Signed)
    Patient name: Jody Nguyen MRN: [id number] DOB: August 11, 1956 Sex: female  08/21/2017 Pre-operative Diagnosis: esrd Post-operative diagnosis:  Same Surgeon:  Erlene Quan C. Donzetta Matters, MD Assistant: OR nursing Procedure Performed: superficialization and branch ligation of right arm radiocephalic av fistula  Indications:  Severe 61 year old female history end-stage renal disease currently on dialysis via catheter has a right arm AV fistula has matured but is difficulty cannulation. She recently underwent a fistulogram which demonstrated adequate flow but small branches and likely was too deep for use. She is now indicated for superficialization with branch ligation.  Findings: Patient with adequate size throughout the entire was almost a centimeter deep at points. There were 3 large branches were ligated between ties. At completion of the palpable thrill and it is beneath the skin.   Procedure:  The patient was identified in the holding area and taken to the operating room where she was placed supine on the operating table and general anesthesia was induced. She was given antibiotics sterilely prepped and draped in the right arm usual fashion timeout called. We identified the fistula as well as a few branches in the forearm with duplex. We then made a longitudinal incision overlying the fistula for approximately 10 cm. Branches were divided by ties. The fistula was fully freed up there was one area. With 6-0 Prolene suture. We then elevated a hemostasis in the wound closed the deep tissue with a running 3-0 Vicryl suture. Skin flaps were then elevated and they were closed with 4-0 Monocryl stitch and Dermabond placed. There was a palpable thrill in the the skin at completion. Patient tolerated procedure well without immediate complication.    Dequita Schleicher C. Donzetta Matters, MD [facility] [place] Office: [phone] Pager: [phone]

## 2017-08-21 NOTE — H&P (Signed)
   History and Physical Update  The patient was interviewed and re-examined.  The patient's previous History and Physical has been reviewed and is unchanged from recent office visit. Plan for superficialization of right arm avf.   Jody Nguyen C. Donzetta Matters, MD [facility] [place] Office: [phone] Pager: [phone]   08/21/2017, 7:24 AM

## 2017-08-21 NOTE — Telephone Encounter (Signed)
Sched appt 09/14/17 at 12:15. Lm on cell#.

## 2017-08-21 NOTE — Transfer of Care (Signed)
Immediate Anesthesia Transfer of Care Note  Patient: Jody Nguyen  Procedure(s) Performed: Procedure(s): FISTULA SUPERFICIALIZATION RIGHT ARM (Right)  Patient Location: PACU  Anesthesia Type:General  Level of Consciousness: alert , oriented, drowsy and patient cooperative  Airway & Oxygen Therapy: Patient Spontanous Breathing and Patient connected to nasal cannula oxygen  Post-op Assessment: Report given to RN and Post -op Vital signs reviewed and stable  Post vital signs: Reviewed and stable  Last Vitals:  Vitals:   08/21/17 0640  BP: (!) 148/75  Pulse: 86  Resp: 16  Temp: 36.9 C  SpO2: 95%    Last Pain: There were no vitals filed for this visit.       Complications: No apparent anesthesia complications

## 2017-08-21 NOTE — Anesthesia Preprocedure Evaluation (Addendum)
Anesthesia Evaluation  Patient identified by MRN, date of birth, ID band Patient awake    Reviewed: Allergy & Precautions, H&P , NPO status , Patient's Chart, lab work & pertinent test results, reviewed documented beta blocker date and time   Airway Mallampati: III  TM Distance: >3 FB Neck ROM: Full    Dental no notable dental hx. (+) Teeth Intact, Dental Advisory Given   Pulmonary neg pulmonary ROS,    Pulmonary exam normal breath sounds clear to auscultation       Cardiovascular hypertension, Pt. on medications and Pt. on home beta blockers +CHF   Rhythm:Regular Rate:Normal     Neuro/Psych CVA, No Residual Symptoms negative psych ROS   GI/Hepatic negative GI ROS, Neg liver ROS,   Endo/Other  diabetes, Type 2, Oral Hypoglycemic AgentsMorbid obesity  Renal/GU ESRF and DialysisRenal disease  negative genitourinary   Musculoskeletal   Abdominal   Peds  Hematology negative hematology ROS (+) anemia ,   Anesthesia Other Findings   Reproductive/Obstetrics negative OB ROS                            Anesthesia Physical Anesthesia Plan  ASA: III  Anesthesia Plan: General   Post-op Pain Management:    Induction: Intravenous  PONV Risk Score and Plan: 4 or greater and Ondansetron, Dexamethasone and Midazolam  Airway Management Planned: LMA  Additional Equipment:   Intra-op Plan:   Post-operative Plan: Extubation in OR  Informed Consent: I have reviewed the patients History and Physical, chart, labs and discussed the procedure including the risks, benefits and alternatives for the proposed anesthesia with the patient or authorized representative who has indicated his/her understanding and acceptance.   Dental advisory given  Plan Discussed with: CRNA  Anesthesia Plan Comments:         Anesthesia Quick Evaluation

## 2017-08-21 NOTE — Progress Notes (Signed)
Report given to jena rn as caregiver 

## 2017-08-21 NOTE — Anesthesia Procedure Notes (Addendum)
Procedure Name: LMA Insertion Date/Time: 08/21/2017 9:23 AM Performed by: Everlean Cherry A Pre-anesthesia Checklist: Patient identified, Emergency Drugs available, Suction available and Patient being monitored Patient Re-evaluated:Patient Re-evaluated prior to induction Oxygen Delivery Method: Circle system utilized Preoxygenation: Pre-oxygenation with 100% oxygen Induction Type: IV induction Ventilation: Mask ventilation without difficulty LMA: LMA inserted LMA Size: 4.0 Number of attempts: 1 Placement Confirmation: positive ETCO2 and breath sounds checked- equal and bilateral Tube secured with: Tape Dental Injury: Teeth and Oropharynx as per pre-operative assessment

## 2017-08-22 ENCOUNTER — Encounter (HOSPITAL_COMMUNITY): Payer: Self-pay | Admitting: Vascular Surgery

## 2017-08-22 DIAGNOSIS — N2581 Secondary hyperparathyroidism of renal origin: Secondary | ICD-10-CM | POA: Diagnosis not present

## 2017-08-22 DIAGNOSIS — D631 Anemia in chronic kidney disease: Secondary | ICD-10-CM | POA: Diagnosis not present

## 2017-08-22 DIAGNOSIS — N186 End stage renal disease: Secondary | ICD-10-CM | POA: Diagnosis not present

## 2017-08-22 DIAGNOSIS — E118 Type 2 diabetes mellitus with unspecified complications: Secondary | ICD-10-CM | POA: Diagnosis not present

## 2017-08-24 DIAGNOSIS — E118 Type 2 diabetes mellitus with unspecified complications: Secondary | ICD-10-CM | POA: Diagnosis not present

## 2017-08-24 DIAGNOSIS — D631 Anemia in chronic kidney disease: Secondary | ICD-10-CM | POA: Diagnosis not present

## 2017-08-24 DIAGNOSIS — N186 End stage renal disease: Secondary | ICD-10-CM | POA: Diagnosis not present

## 2017-08-24 DIAGNOSIS — N2581 Secondary hyperparathyroidism of renal origin: Secondary | ICD-10-CM | POA: Diagnosis not present

## 2017-08-26 DIAGNOSIS — E1129 Type 2 diabetes mellitus with other diabetic kidney complication: Secondary | ICD-10-CM | POA: Diagnosis not present

## 2017-08-26 DIAGNOSIS — N186 End stage renal disease: Secondary | ICD-10-CM | POA: Diagnosis not present

## 2017-08-26 DIAGNOSIS — Z992 Dependence on renal dialysis: Secondary | ICD-10-CM | POA: Diagnosis not present

## 2017-08-27 DIAGNOSIS — D631 Anemia in chronic kidney disease: Secondary | ICD-10-CM | POA: Diagnosis not present

## 2017-08-27 DIAGNOSIS — N186 End stage renal disease: Secondary | ICD-10-CM | POA: Diagnosis not present

## 2017-08-27 DIAGNOSIS — E118 Type 2 diabetes mellitus with unspecified complications: Secondary | ICD-10-CM | POA: Diagnosis not present

## 2017-08-27 DIAGNOSIS — N2581 Secondary hyperparathyroidism of renal origin: Secondary | ICD-10-CM | POA: Diagnosis not present

## 2017-08-27 DIAGNOSIS — D509 Iron deficiency anemia, unspecified: Secondary | ICD-10-CM | POA: Diagnosis not present

## 2017-08-28 ENCOUNTER — Telehealth: Payer: Self-pay | Admitting: *Deleted

## 2017-08-28 NOTE — Telephone Encounter (Signed)
Patient called stating that she has a rash extending from the AVF to her shoulder, several on her chest and face.  She states that she had the same symptoms with the creation of the AVF and after a few days they were gone. She states that last PM she took benadryl.  I instructed patient to observe and if worsens to call the office, unless the rash becomes severe in nature, then she is to report to the ED.  Patient voiced understanding of the instructions.

## 2017-08-29 DIAGNOSIS — N2581 Secondary hyperparathyroidism of renal origin: Secondary | ICD-10-CM | POA: Diagnosis not present

## 2017-08-29 DIAGNOSIS — E118 Type 2 diabetes mellitus with unspecified complications: Secondary | ICD-10-CM | POA: Diagnosis not present

## 2017-08-29 DIAGNOSIS — D631 Anemia in chronic kidney disease: Secondary | ICD-10-CM | POA: Diagnosis not present

## 2017-08-29 DIAGNOSIS — N186 End stage renal disease: Secondary | ICD-10-CM | POA: Diagnosis not present

## 2017-08-29 DIAGNOSIS — D509 Iron deficiency anemia, unspecified: Secondary | ICD-10-CM | POA: Diagnosis not present

## 2017-08-31 ENCOUNTER — Other Ambulatory Visit: Payer: Self-pay | Admitting: *Deleted

## 2017-08-31 DIAGNOSIS — D631 Anemia in chronic kidney disease: Secondary | ICD-10-CM | POA: Diagnosis not present

## 2017-08-31 DIAGNOSIS — E118 Type 2 diabetes mellitus with unspecified complications: Secondary | ICD-10-CM | POA: Diagnosis not present

## 2017-08-31 DIAGNOSIS — N186 End stage renal disease: Secondary | ICD-10-CM | POA: Diagnosis not present

## 2017-08-31 DIAGNOSIS — D509 Iron deficiency anemia, unspecified: Secondary | ICD-10-CM | POA: Diagnosis not present

## 2017-08-31 DIAGNOSIS — N2581 Secondary hyperparathyroidism of renal origin: Secondary | ICD-10-CM | POA: Diagnosis not present

## 2017-08-31 MED ORDER — CARVEDILOL 6.25 MG PO TABS: 6.2500 mg | ORAL_TABLET | Freq: Two times a day (BID) | ORAL | 1 refills | Status: DC

## 2017-08-31 MED ORDER — BUSPIRONE HCL 10 MG PO TABS: 10.0000 mg | ORAL_TABLET | Freq: Two times a day (BID) | ORAL | 1 refills | Status: DC

## 2017-08-31 MED ORDER — SERTRALINE HCL 25 MG PO TABS: 25.0000 mg | ORAL_TABLET | Freq: Every day | ORAL | 1 refills | Status: DC

## 2017-09-03 DIAGNOSIS — N2581 Secondary hyperparathyroidism of renal origin: Secondary | ICD-10-CM | POA: Diagnosis not present

## 2017-09-03 DIAGNOSIS — D631 Anemia in chronic kidney disease: Secondary | ICD-10-CM | POA: Diagnosis not present

## 2017-09-03 DIAGNOSIS — N186 End stage renal disease: Secondary | ICD-10-CM | POA: Diagnosis not present

## 2017-09-03 DIAGNOSIS — E118 Type 2 diabetes mellitus with unspecified complications: Secondary | ICD-10-CM | POA: Diagnosis not present

## 2017-09-03 DIAGNOSIS — D509 Iron deficiency anemia, unspecified: Secondary | ICD-10-CM | POA: Diagnosis not present

## 2017-09-05 DIAGNOSIS — N2581 Secondary hyperparathyroidism of renal origin: Secondary | ICD-10-CM | POA: Diagnosis not present

## 2017-09-05 DIAGNOSIS — N186 End stage renal disease: Secondary | ICD-10-CM | POA: Diagnosis not present

## 2017-09-05 DIAGNOSIS — D509 Iron deficiency anemia, unspecified: Secondary | ICD-10-CM | POA: Diagnosis not present

## 2017-09-05 DIAGNOSIS — E118 Type 2 diabetes mellitus with unspecified complications: Secondary | ICD-10-CM | POA: Diagnosis not present

## 2017-09-05 DIAGNOSIS — D631 Anemia in chronic kidney disease: Secondary | ICD-10-CM | POA: Diagnosis not present

## 2017-09-07 DIAGNOSIS — N2581 Secondary hyperparathyroidism of renal origin: Secondary | ICD-10-CM | POA: Diagnosis not present

## 2017-09-07 DIAGNOSIS — D631 Anemia in chronic kidney disease: Secondary | ICD-10-CM | POA: Diagnosis not present

## 2017-09-07 DIAGNOSIS — N186 End stage renal disease: Secondary | ICD-10-CM | POA: Diagnosis not present

## 2017-09-07 DIAGNOSIS — E118 Type 2 diabetes mellitus with unspecified complications: Secondary | ICD-10-CM | POA: Diagnosis not present

## 2017-09-07 DIAGNOSIS — D509 Iron deficiency anemia, unspecified: Secondary | ICD-10-CM | POA: Diagnosis not present

## 2017-09-10 DIAGNOSIS — E118 Type 2 diabetes mellitus with unspecified complications: Secondary | ICD-10-CM | POA: Diagnosis not present

## 2017-09-10 DIAGNOSIS — D509 Iron deficiency anemia, unspecified: Secondary | ICD-10-CM | POA: Diagnosis not present

## 2017-09-10 DIAGNOSIS — N2581 Secondary hyperparathyroidism of renal origin: Secondary | ICD-10-CM | POA: Diagnosis not present

## 2017-09-10 DIAGNOSIS — D631 Anemia in chronic kidney disease: Secondary | ICD-10-CM | POA: Diagnosis not present

## 2017-09-10 DIAGNOSIS — N186 End stage renal disease: Secondary | ICD-10-CM | POA: Diagnosis not present

## 2017-09-11 ENCOUNTER — Ambulatory Visit (INDEPENDENT_AMBULATORY_CARE_PROVIDER_SITE_OTHER): Payer: Medicare Other | Admitting: Internal Medicine

## 2017-09-11 ENCOUNTER — Encounter: Payer: Self-pay | Admitting: Internal Medicine

## 2017-09-11 ENCOUNTER — Other Ambulatory Visit: Payer: Self-pay | Admitting: *Deleted

## 2017-09-11 VITALS — BP 138/84 | HR 76 | Temp 97.3°F | Resp 18 | Ht 60.0 in | Wt 200.0 lb

## 2017-09-11 DIAGNOSIS — Z136 Encounter for screening for cardiovascular disorders: Secondary | ICD-10-CM | POA: Diagnosis not present

## 2017-09-11 DIAGNOSIS — E782 Mixed hyperlipidemia: Secondary | ICD-10-CM | POA: Diagnosis not present

## 2017-09-11 DIAGNOSIS — E1129 Type 2 diabetes mellitus with other diabetic kidney complication: Secondary | ICD-10-CM

## 2017-09-11 DIAGNOSIS — Z794 Long term (current) use of insulin: Secondary | ICD-10-CM

## 2017-09-11 DIAGNOSIS — E559 Vitamin D deficiency, unspecified: Secondary | ICD-10-CM

## 2017-09-11 DIAGNOSIS — Z23 Encounter for immunization: Secondary | ICD-10-CM | POA: Diagnosis not present

## 2017-09-11 DIAGNOSIS — Z1212 Encounter for screening for malignant neoplasm of rectum: Secondary | ICD-10-CM

## 2017-09-11 DIAGNOSIS — I5032 Chronic diastolic (congestive) heart failure: Secondary | ICD-10-CM | POA: Diagnosis not present

## 2017-09-11 DIAGNOSIS — N186 End stage renal disease: Secondary | ICD-10-CM | POA: Diagnosis not present

## 2017-09-11 DIAGNOSIS — Z9889 Other specified postprocedural states: Secondary | ICD-10-CM

## 2017-09-11 DIAGNOSIS — Z79899 Other long term (current) drug therapy: Secondary | ICD-10-CM

## 2017-09-11 DIAGNOSIS — I10 Essential (primary) hypertension: Secondary | ICD-10-CM | POA: Diagnosis not present

## 2017-09-11 DIAGNOSIS — E119 Type 2 diabetes mellitus without complications: Secondary | ICD-10-CM | POA: Diagnosis not present

## 2017-09-11 DIAGNOSIS — Z992 Dependence on renal dialysis: Secondary | ICD-10-CM

## 2017-09-11 DIAGNOSIS — I429 Cardiomyopathy, unspecified: Secondary | ICD-10-CM

## 2017-09-11 DIAGNOSIS — IMO0001 Reserved for inherently not codable concepts without codable children: Secondary | ICD-10-CM

## 2017-09-11 MED ORDER — CINACALCET HCL 60 MG PO TABS: 60.0000 mg | ORAL_TABLET | Freq: Every day | ORAL | 1 refills | Status: AC

## 2017-09-11 MED ORDER — LOSARTAN POTASSIUM 50 MG PO TABS
50.0000 mg | ORAL_TABLET | Freq: Every day | ORAL | 1 refills | Status: DC
Start: 2017-09-11 — End: 2018-04-25

## 2017-09-11 MED ORDER — ROSUVASTATIN CALCIUM 20 MG PO TABS: 20.0000 mg | ORAL_TABLET | Freq: Every day | ORAL | 1 refills | Status: DC

## 2017-09-11 NOTE — Patient Instructions (Signed)
Preventive Care for Adults  A healthy lifestyle and preventive care can promote health and wellness. Preventive health guidelines for women include the following key practices.  A routine yearly physical is a good way to check with your health care provider about your health and preventive screening. It is a chance to share any concerns and updates on your health and to receive a thorough exam.  Visit your dentist for a routine exam and preventive care every 6 months. Brush your teeth twice a day and floss once a day. Good oral hygiene prevents tooth decay and gum disease.  The frequency of eye exams is based on your age, health, family medical history, use of contact lenses, and other factors. Follow your health care provider's recommendations for frequency of eye exams.  Eat a healthy diet. Foods like vegetables, fruits, whole grains, low-fat dairy products, and lean protein foods contain the nutrients you need without too many calories. Decrease your intake of foods high in solid fats, added sugars, and salt. Eat the right amount of calories for you.Get information about a proper diet from your health care provider, if necessary.  Regular physical exercise is one of the most important things you can do for your health. Most adults should get at least 150 minutes of moderate-intensity exercise (any activity that increases your heart rate and causes you to sweat) each week. In addition, most adults need muscle-strengthening exercises on 2 or more days a week.  Maintain a healthy weight. The body mass index (BMI) is a screening tool to identify possible weight problems. It provides an estimate of body fat based on height and weight. Your health care provider can find your BMI and can help you achieve or maintain a healthy weight.For adults 20 years and older:  A BMI below 18.5 is considered underweight.  A BMI of 18.5 to 24.9 is normal.  A BMI of 25 to 29.9 is considered overweight.  A BMI of  30 and above is considered obese.  Maintain normal blood lipids and cholesterol levels by exercising and minimizing your intake of saturated fat. Eat a balanced diet with plenty of fruit and vegetables. Blood tests for lipids and cholesterol should begin at age 20 and be repeated every 5 years. If your lipid or cholesterol levels are high, you are over 50, or you are at high risk for heart disease, you may need your cholesterol levels checked more frequently.Ongoing high lipid and cholesterol levels should be treated with medicines if diet and exercise are not working.  If you smoke, find out from your health care provider how to quit. If you do not use tobacco, do not start.  Lung cancer screening is recommended for adults aged 55-80 years who are at high risk for developing lung cancer because of a history of smoking. A yearly low-dose CT scan of the lungs is recommended for people who have at least a 30-pack-year history of smoking and are a current smoker or have quit within the past 15 years. A pack year of smoking is smoking an average of 1 pack of cigarettes a day for 1 year (for example: 1 pack a day for 30 years or 2 packs a day for 15 years). Yearly screening should continue until the smoker has stopped smoking for at least 15 years. Yearly screening should be stopped for people who develop a health problem that would prevent them from having lung cancer treatment.  High blood pressure causes heart disease and increases the risk of   stroke. Your blood pressure should be checked at least every 1 to 2 years. Ongoing high blood pressure should be treated with medicines if weight loss and exercise do not work.  If you are 55-79 years old, ask your health care provider if you should take aspirin to prevent strokes.  Diabetes screening involves taking a blood sample to check your fasting blood sugar level. This should be done once every 3 years, after age 45, if you are within normal weight and  without risk factors for diabetes. Testing should be considered at a younger age or be carried out more frequently if you are overweight and have at least 1 risk factor for diabetes.  Breast cancer screening is essential preventive care for women. You should practice "breast self-awareness." This means understanding the normal appearance and feel of your breasts and may include breast self-examination. Any changes detected, no matter how small, should be reported to a health care provider. Women in their 20s and 30s should have a clinical breast exam (CBE) by a health care provider as part of a regular health exam every 1 to 3 years. After age 40, women should have a CBE every year. Starting at age 40, women should consider having a mammogram (breast X-ray test) every year. Women who have a family history of breast cancer should talk to their health care provider about genetic screening. Women at a high risk of breast cancer should talk to their health care providers about having an MRI and a mammogram every year.  Breast cancer gene (BRCA)-related cancer risk assessment is recommended for women who have family members with BRCA-related cancers. BRCA-related cancers include breast, ovarian, tubal, and peritoneal cancers. Having family members with these cancers may be associated with an increased risk for harmful changes (mutations) in the breast cancer genes BRCA1 and BRCA2. Results of the assessment will determine the need for genetic counseling and BRCA1 and BRCA2 testing.  Routine pelvic exams to screen for cancer are no longer recommended for nonpregnant women who are considered low risk for cancer of the pelvic organs (ovaries, uterus, and vagina) and who do not have symptoms. Ask your health care provider if a screening pelvic exam is right for you.  If you have had past treatment for cervical cancer or a condition that could lead to cancer, you need Pap tests and screening for cancer for at least 20  years after your treatment. If Pap tests have been discontinued, your risk factors (such as having a new sexual partner) need to be reassessed to determine if screening should be resumed. Some women have medical problems that increase the chance of getting cervical cancer. In these cases, your health care provider may recommend more frequent screening and Pap tests.  Colorectal cancer can be detected and often prevented. Most routine colorectal cancer screening begins at the age of 50 years and continues through age 75 years. However, your health care provider may recommend screening at an earlier age if you have risk factors for colon cancer. On a yearly basis, your health care provider may provide home test kits to check for hidden blood in the stool. Use of a small camera at the end of a tube, to directly examine the colon (sigmoidoscopy or colonoscopy), can detect the earliest forms of colorectal cancer. Talk to your health care provider about this at age 50, when routine screening begins. Direct exam of the colon should be repeated every 5-10 years through age 75 years, unless early forms of pre-cancerous   polyps or small growths are found.  Hepatitis C blood testing is recommended for all people born from 1945 through 1965 and any individual with known risks for hepatitis C.  Pra  Osteoporosis is a disease in which the bones lose minerals and strength with aging. This can result in serious bone fractures or breaks. The risk of osteoporosis can be identified using a bone density scan. Women ages 65 years and over and women at risk for fractures or osteoporosis should discuss screening with their health care providers. Ask your health care provider whether you should take a calcium supplement or vitamin D to reduce the rate of osteoporosis.  Menopause can be associated with physical symptoms and risks. Hormone replacement therapy is available to decrease symptoms and risks. You should talk to your  health care provider about whether hormone replacement therapy is right for you.  Use sunscreen. Apply sunscreen liberally and repeatedly throughout the day. You should seek shade when your shadow is shorter than you. Protect yourself by wearing long sleeves, pants, a wide-brimmed hat, and sunglasses year round, whenever you are outdoors.  Once a month, do a whole body skin exam, using a mirror to look at the skin on your back. Tell your health care provider of new moles, moles that have irregular borders, moles that are larger than a pencil eraser, or moles that have changed in shape or color.  Stay current with required vaccines (immunizations).  Influenza vaccine. All adults should be immunized every year.  Tetanus, diphtheria, and acellular pertussis (Td, Tdap) vaccine. Pregnant women should receive 1 dose of Tdap vaccine during each pregnancy. The dose should be obtained regardless of the length of time since the last dose. Immunization is preferred during the 27th-36th week of gestation. An adult who has not previously received Tdap or who does not know her vaccine status should receive 1 dose of Tdap. This initial dose should be followed by tetanus and diphtheria toxoids (Td) booster doses every 10 years. Adults with an unknown or incomplete history of completing a 3-dose immunization series with Td-containing vaccines should begin or complete a primary immunization series including a Tdap dose. Adults should receive a Td booster every 10 years.  Varicella vaccine. An adult without evidence of immunity to varicella should receive 2 doses or a second dose if she has previously received 1 dose. Pregnant females who do not have evidence of immunity should receive the first dose after pregnancy. This first dose should be obtained before leaving the health care facility. The second dose should be obtained 4-8 weeks after the first dose.  Human papillomavirus (HPV) vaccine. Females aged 13-26 years  who have not received the vaccine previously should obtain the 3-dose series. The vaccine is not recommended for use in pregnant females. However, pregnancy testing is not needed before receiving a dose. If a female is found to be pregnant after receiving a dose, no treatment is needed. In that case, the remaining doses should be delayed until after the pregnancy. Immunization is recommended for any person with an immunocompromised condition through the age of 26 years if she did not get any or all doses earlier. During the 3-dose series, the second dose should be obtained 4-8 weeks after the first dose. The third dose should be obtained 24 weeks after the first dose and 16 weeks after the second dose.  Zoster vaccine. One dose is recommended for adults aged 60 years or older unless certain conditions are present.  Measles, mumps, and rubella (  MMR) vaccine. Adults born before [birth year] generally are considered immune to measles and mumps. Adults born in [birth year] or later should have 1 or more doses of MMR vaccine unless there is a contraindication to the vaccine or there is laboratory evidence of immunity to each of the three diseases. A routine second dose of MMR vaccine should be obtained at least 28 days after the first dose for students attending postsecondary schools, health care workers, or international travelers. People who received inactivated measles vaccine or an unknown type of measles vaccine during 1963-1967 should receive 2 doses of MMR vaccine. People who received inactivated mumps vaccine or an unknown type of mumps vaccine before 1979 and are at high risk for mumps infection should consider immunization with 2 doses of MMR vaccine. For females of childbearing age, rubella immunity should be determined. If there is no evidence of immunity, females who are not pregnant should be vaccinated. If there is no evidence of immunity, females who are pregnant should delay immunization until after pregnancy.  Unvaccinated health care workers born before [birth year] who lack laboratory evidence of measles, mumps, or rubella immunity or laboratory confirmation of disease should consider measles and mumps immunization with 2 doses of MMR vaccine or rubella immunization with 1 dose of MMR vaccine.  Pneumococcal 13-valent conjugate (PCV13) vaccine. When indicated, a person who is uncertain of her immunization history and has no record of immunization should receive the PCV13 vaccine. An adult aged 39 years or older who has certain medical conditions and has not been previously immunized should receive 1 dose of PCV13 vaccine. This PCV13 should be followed with a dose of pneumococcal polysaccharide (PPSV23) vaccine. The PPSV23 vaccine dose should be obtained at least 8 weeks after the dose of PCV13 vaccine. An adult aged 62 years or older who has certain medical conditions and previously received 1 or more doses of PPSV23 vaccine should receive 1 dose of PCV13. The PCV13 vaccine dose should be obtained 1 or more years after the last PPSV23 vaccine dose.    Pneumococcal polysaccharide (PPSV23) vaccine. When PCV13 is also indicated, PCV13 should be obtained first. All adults aged 67 years and older should be immunized. An adult younger than age 45 years who has certain medical conditions should be immunized. Any person who resides in a nursing home or long-term care facility should be immunized. An adult smoker should be immunized. People with an immunocompromised condition and certain other conditions should receive both PCV13 and PPSV23 vaccines. People with human immunodeficiency virus (HIV) infection should be immunized as soon as possible after diagnosis. Immunization during chemotherapy or radiation therapy should be avoided. Routine use of PPSV23 vaccine is not recommended for American Indians, [place] Natives, or people younger than 65 years unless there are medical conditions that require PPSV23 vaccine. When indicated,  people who have unknown immunization and have no record of immunization should receive PPSV23 vaccine. One-time revaccination 5 years after the first dose of PPSV23 is recommended for people aged 19-64 years who have chronic kidney failure, nephrotic syndrome, asplenia, or immunocompromised conditions. People who received 1-2 doses of PPSV23 before age 23 years should receive another dose of PPSV23 vaccine at age 35 years or later if at least 5 years have passed since the previous dose. Doses of PPSV23 are not needed for people immunized with PPSV23 at or after age 38 years.  Preventive Services / Frequency   Ages 43 to 86 years  Blood pressure check.  Lipid and cholesterol check.  Lung  cancer screening. / Every year if you are aged 64-80 years and have a 30-pack-year history of smoking and currently smoke or have quit within the past 15 years. Yearly screening is stopped once you have quit smoking for at least 15 years or develop a health problem that would prevent you from having lung cancer treatment.  Clinical breast exam.** / Every year after age 36 years.  BRCA-related cancer risk assessment.** / For women who have family members with a BRCA-related cancer (breast, ovarian, tubal, or peritoneal cancers).  Mammogram.** / Every year beginning at age 109 years and continuing for as long as you are in good health. Consult with your health care provider.  Pap test.** / Every 3 years starting at age 39 years through age 65 or 28 years with a history of 3 consecutive normal Pap tests.  HPV screening.** / Every 3 years from ages 91 years through ages 32 to 78 years with a history of 3 consecutive normal Pap tests.  Fecal occult blood test (FOBT) of stool. / Every year beginning at age 26 years and continuing until age 12 years. You may not need to do this test if you get a colonoscopy every 10 years.  Flexible sigmoidoscopy or colonoscopy.** / Every 5 years for a flexible sigmoidoscopy or  every 10 years for a colonoscopy beginning at age 34 years and continuing until age 60 years.  Hepatitis C blood test.** / For all people born from [birth year] through 1965 and any individual with known risks for hepatitis C.  Skin self-exam. / Monthly.  Influenza vaccine. / Every year.  Tetanus, diphtheria, and acellular pertussis (Tdap/Td) vaccine.** / Consult your health care provider. Pregnant women should receive 1 dose of Tdap vaccine during each pregnancy. 1 dose of Td every 10 years.  Varicella vaccine.** / Consult your health care provider. Pregnant females who do not have evidence of immunity should receive the first dose after pregnancy.  Zoster vaccine.** / 1 dose for adults aged 11 years or older.  Pneumococcal 13-valent conjugate (PCV13) vaccine.** / Consult your health care provider.  Pneumococcal polysaccharide (PPSV23) vaccine.** / 1 to 2 doses if you smoke cigarettes or if you have certain conditions.  Meningococcal vaccine.** / Consult your health care provider.  Hepatitis A vaccine.** / Consult your health care provider.  Hepatitis B vaccine.** / Consult your health care provider. Screening for abdominal aortic aneurysm (AAA)  by ultrasound is recommended for people over 50 who have history of high blood pressure or who are current or former smokers. ++++++++++++++++++ Recommend Adult Low Dose Aspirin or  coated  Aspirin 81 mg daily  To reduce risk of Colon Cancer 20 %,  Skin Cancer 26 % ,  Melanoma 46%  and  Pancreatic cancer 60% +++++++++++++++++++ Vitamin D goal  is between 70-100.  Please make sure that you are taking your Vitamin D as directed.  It is very important as a natural anti-inflammatory  helping hair, skin, and nails, as well as reducing stroke and heart attack risk.  It helps your bones and helps with mood. It also decreases numerous cancer risks so please take it as directed.  Low Vit D is associated with a 200-300% higher risk for CANCER  and  200-300% higher risk for HEART   ATTACK  &  STROKE.   .....................................Marland Kitchen It is also associated with higher death rate at younger ages,  autoimmune diseases like Rheumatoid arthritis, Lupus, Multiple Sclerosis.    Also many other serious conditions, like depression,  Alzheimer's Dementia, infertility, muscle aches, fatigue, fibromyalgia - just to name a few. ++++++++++++++++++ Recommend the book "The END of DIETING" by Dr Excell Seltzer  & the book "The END of DIABETES " by Dr Excell Seltzer At [email or web address] - get book & Audio CD's    Being diabetic has a  300% increased risk for heart attack, stroke, cancer, and alzheimer- type vascular dementia. It is very important that you work harder with diet by avoiding all foods that are white. Avoid white rice (brown & wild rice is OK), white potatoes (sweetpotatoes in moderation is OK), White bread or wheat bread or anything made out of white flour like bagels, donuts, rolls, buns, biscuits, cakes, pastries, cookies, pizza crust, and pasta (made from white flour & egg whites) - vegetarian pasta or spinach or wheat pasta is OK. Multigrain breads like Arnold's or [employer], or multigrain sandwich thins or flatbreads.  Diet, exercise and weight loss can reverse and cure diabetes in the early stages.  Diet, exercise and weight loss is very important in the control and prevention of complications of diabetes which affects every system in your body, ie. Brain - dementia/stroke, eyes - glaucoma/blindness, heart - heart attack/heart failure, kidneys - dialysis, stomach - gastric paralysis, intestines - malabsorption, nerves - severe painful neuritis, circulation - gangrene & loss of a leg(s), and finally cancer and Alzheimers.    I recommend avoid fried & greasy foods,  sweets/candy, white rice (brown or wild rice or Quinoa is OK), white potatoes (sweet potatoes are OK) - anything made from white flour - bagels, doughnuts, rolls, buns, biscuits,white  and wheat breads, pizza crust and traditional pasta made of white flour & egg white(vegetarian pasta or spinach or wheat pasta is OK).  Multi-grain bread is OK - like multi-grain flat bread or sandwich thins. Avoid alcohol in excess. Exercise is also important.    Eat all the vegetables you want - avoid meat, especially red meat and dairy - especially cheese.  Cheese is the most concentrated form of trans-fats which is the worst thing to clog up our arteries. Veggie cheese is OK which can be found in the fresh produce section at [employer] or [employer] or [employer]  ++++++++++++++++++++++ DASH Eating Plan  DASH stands for "Dietary Approaches to Stop Hypertension."   The DASH eating plan is a healthy eating plan that has been shown to reduce high blood pressure (hypertension). Additional health benefits may include reducing the risk of type 2 diabetes mellitus, heart disease, and stroke. The DASH eating plan may also help with weight loss. WHAT DO I NEED TO KNOW ABOUT THE DASH EATING PLAN? For the DASH eating plan, you will follow these general guidelines:  Choose foods with a percent daily value for sodium of less than 5% (as listed on the food label).  Use salt-free seasonings or herbs instead of table salt or sea salt.  Check with your health care provider or pharmacist before using salt substitutes.  Eat lower-sodium products, often labeled as "lower sodium" or "no salt added."  Eat fresh foods.  Eat more vegetables, fruits, and low-fat dairy products.  Choose whole grains. Look for the word "whole" as the first word in the ingredient list.  Choose fish   Limit sweets, desserts, sugars, and sugary drinks.  Choose heart-healthy fats.  Eat veggie cheese   Eat more home-cooked food and less restaurant, buffet, and fast food.  Limit fried foods.  Cook foods using methods other than frying.  Limit canned  vegetables. If you do use them, rinse them well to decrease the  sodium.  When eating at a restaurant, ask that your food be prepared with less salt, or no salt if possible.                      WHAT FOODS CAN I EAT? Read Dr Fara Olden Fuhrman's books on The End of Dieting & The End of Diabetes  Grains Whole grain or whole wheat bread. Brown rice. Whole grain or whole wheat pasta. Quinoa, bulgur, and whole grain cereals. Low-sodium cereals. Corn or whole wheat flour tortillas. Whole grain cornbread. Whole grain crackers. Low-sodium crackers.  Vegetables Fresh or frozen vegetables (raw, steamed, roasted, or grilled). Low-sodium or reduced-sodium tomato and vegetable juices. Low-sodium or reduced-sodium tomato sauce and paste. Low-sodium or reduced-sodium canned vegetables.   Fruits All fresh, canned (in natural juice), or frozen fruits.  Protein Products  All fish and seafood.  Dried beans, peas, or lentils. Unsalted nuts and seeds. Unsalted canned beans.  Dairy Low-fat dairy products, such as skim or 1% milk, 2% or reduced-fat cheeses, low-fat ricotta or cottage cheese, or plain low-fat yogurt. Low-sodium or reduced-sodium cheeses.  Fats and Oils Tub margarines without trans fats. Light or reduced-fat mayonnaise and salad dressings (reduced sodium). Avocado. Safflower, olive, or canola oils. Natural peanut or almond butter.  Other Unsalted popcorn and pretzels. The items listed above may not be a complete list of recommended foods or beverages. Contact your dietitian for more options.  ++++++++++++++++++  WHAT FOODS ARE NOT RECOMMENDED? Grains/ White flour or wheat flour White bread. White pasta. White rice. Refined cornbread. Bagels and croissants. Crackers that contain trans fat.  Vegetables  Creamed or fried vegetables. Vegetables in a . Regular canned vegetables. Regular canned tomato sauce and paste. Regular tomato and vegetable juices.  Fruits Dried fruits. Canned fruit in light or heavy syrup. Fruit juice.  Meat and Other Protein  Products Meat in general - RED meat & White meat.  Fatty cuts of meat. Ribs, chicken wings, all processed meats as bacon, sausage, bologna, salami, fatback, hot dogs, bratwurst and packaged luncheon meats.  Dairy Whole or 2% milk, cream, half-and-half, and cream cheese. Whole-fat or sweetened yogurt. Full-fat cheeses or blue cheese. Non-dairy creamers and whipped toppings. Processed cheese, cheese spreads, or cheese curds.  Condiments Onion and garlic salt, seasoned salt, table salt, and sea salt. Canned and packaged gravies. Worcestershire sauce. Tartar sauce. Barbecue sauce. Teriyaki sauce. Soy sauce, including reduced sodium. Steak sauce. Fish sauce. Oyster sauce. Cocktail sauce. Horseradish. Ketchup and mustard. Meat flavorings and tenderizers. Bouillon cubes. Hot sauce. Tabasco sauce. Marinades. Taco seasonings. Relishes.  Fats and Oils Butter, stick margarine, lard, shortening and bacon fat. Coconut, palm kernel, or palm oils. Regular salad dressings.  Pickles and olives. Salted popcorn and pretzels.  The items listed above may not be a complete list of foods and beverages to avoid.

## 2017-09-11 NOTE — Progress Notes (Signed)
[facility] Unk Pinto, M.D.     Uvaldo Bristle. Silverio Lay, P.A.-C Liane Comber, [place], [place] [address] Telephone [phone] Telefax [phone] Annual Screening/Preventative Visit & Comprehensive Evaluation &  Examination     This very nice 61 y.o. [name] presents for a Screening/Preventative Visit & comprehensive evaluation and management of multiple medical co-morbidities.  Patient has been followed for HTN, T2_NIDDM  W/ESRD on Dialysis 3 x/week, Hyperlipidemia and Vitamin D Deficiency.      HTN predates since 1999. Patient's BP has been controlled at home and patient denies any cardiac symptoms as chest pain, palpitations, shortness of breath, dizziness or ankle swelling. In Aug, 2016 she underwent elective an elective AoVR and had anesthesia complications culminating in multisystems failure and was transferred to [place] for ECMO and also in Renal failure, her renal functions never recovered and she was initiated on Dialysis and is followed locally by Dr Joelyn Oms. She is on a transplant list. She was also diagnosed at [facility] with a Cardiomyopathy.  Today's BP is at goal - 138/84.      Patient's hyperlipidemia is controlled with diet and medications. Patient denies myalgias or other medication SE's. Last lipids were at goal:  Lab Results  Component Value Date   CHOL 113 06/07/2017   HDL 50 (L) 06/07/2017   LDLCALC 38 06/07/2017   TRIG 127 06/07/2017   CHOLHDL 2.3 06/07/2017      Patient has Morbid Obesity (BMI 39+) and consequent T2_NIDDM predating since 2006. Patient had been on Insulin since 2006 til starting on Dialysis in Aug 2016, she was able to taper off of her Lantis, Metformin & Invokana and now reports Glucoses range betw 130-140 mg%. She has prn Novolin 70/30 > 180 mg% and hasn't required insulin in many months.  She denies reactive hypoglycemic symptoms, visual  blurring, diabetic polys, or paresthesias. Last A1c was near goal: Lab Results  Component Value Date   HGBA1C 5.7 (H) 06/07/2017      Finally, patient has history of Vitamin D Deficiency ("12" / 2008) and last Vitamin D was still low: Lab Results  Component Value Date   VD25OH 31 03/06/2017   Current Outpatient Prescriptions on File Prior to Visit  Medication Sig  . acetaminophen (TYLENOL) 500 MG tablet Take 1,000 mg by mouth every 6 (six) hours as needed (for pain.).  Marland Kitchen azelastine (ASTELIN) 0.1 % nasal spray Place 2 sprays into both nostrils 2 (two) times daily. Use in each nostril as directed (Patient taking differently: Place 2 sprays into both nostrils 2 (two) times daily as needed (for allergies.). Use in each nostril as directed)  . busPIRone (BUSPAR) 10 MG tablet Take 1 tablet (10 mg total) by mouth 2 (two) times daily.  . carvedilol (COREG) 6.25 MG tablet Take 1 tablet (6.25 mg total) by mouth 2 (two) times daily.  . insulin aspart protamine - aspart (NOVOLOG MIX 70/30 FLEXPEN) (70-30) 100 UNIT/ML FlexPen Inject 0.1 mLs (10 Units total) into the skin 2 (two) times daily. (Patient taking differently: Inject 10 Units into the skin 2 (two) times daily as needed (if blood sugar is above 180). )  . oxyCODONE-acetaminophen (ROXICET) 5-325 MG tablet Take 1-2 tablets by mouth every 6 (six) hours as needed.  Marland Kitchen RENVELA 800 MG tablet Take 1,600-4,000 mg by mouth 3 (three) times daily. 4000 mg (5 caps) with meals and 1600 mg (2 caps) with snacks  . sertraline (ZOLOFT)  25 MG tablet Take 1 tablet (25 mg total) by mouth daily.   No current facility-administered medications on file prior to visit.    Allergies  Allergen Reactions  . Minoxidil Other (See Comments)    Pericardial effusion  . Lipitor [Atorvastatin] Other (See Comments)    MYALGIAS > "pain in legs" Tolerates rosuvastatin  . Ace Inhibitors Other (See Comments)    REACTION: "not sure...think it made me drowsy all the time"   Past  Medical History:  Diagnosis Date  . Anemia   . Chronic diastolic CHF (congestive heart failure) ([place])   . Chronic diastolic heart failure ([place])   . CKD (chronic kidney disease) stage 4, GFR 15-29 ml/min (HCC)    dialysis M/W/F  . Constipation   . CVA (cerebral infarction) 1997   no residual deficit  . Diverticulitis   . DM (diabetes mellitus) ([place])    type 2  . Heart murmur   . History of cardiovascular stress test    a. Myoview Oct 2012 showed EF 49%, no ischemia, LVE  . HLD (hyperlipidemia)    takes Crestor daily  . Hypertension   . Pericardial effusion    chronic; felt to be poss related to minoxidil >> DC'd  . Pulmonary hypertension ([place]) 06/18/2015  . Respiratory failure, acute hypoxic, post-operative 07/08/2015   Requiring ECMO support  . S/P cardiac catheterization    a. R/L HC 06/18/15:  mLAD 30%; severe pulmo HTN with PA sat 43%, CI 1.86, prominent V waves indicative of MR; resting hypoxemia O2 sat 86% on RA  . S/P minimally invasive mitral valve repair 07/08/2015   Complex valvuloplasty including artificial Gore-tex neochord placement x10 and 26 mm Sorin Memo 3D Rechord ring annuloplasty via right mini thoracotomy approach  . Severe mitral regurgitation   . Shortness of breath dyspnea   . Stroke ([place]) 1997   no residual effect  . Vitamin D deficiency   . Wears glasses    Health Maintenance  Topic Date Due  . PNEUMOCOCCAL POLYSACCHARIDE VACCINE (2) 07/28/2013  . OPHTHALMOLOGY EXAM  04/05/2017  . FOOT EXAM  08/08/2017  . HEMOGLOBIN A1C  12/08/2017  . MAMMOGRAM  07/13/2018  . TETANUS/TDAP  12/28/2018  . PAP SMEAR  12/08/2019  . COLONOSCOPY  01/18/2022  . INFLUENZA VACCINE  Completed  . Hepatitis C Screening  Completed  . HIV Screening  Completed   Immunization History  Administered Date(s) Administered  . Influenza Split 08/20/2012, 08/15/2017  . PPD Test 03/25/2014, 03/29/2015  . Pneumococcal Polysaccharide-23 09/11/2017  . Pneumococcal-Unspecified 07/28/2008   . Tdap 12/28/2008   Past Surgical History:  Procedure Laterality Date  . A/V FISTULAGRAM Right 08/16/2017   Procedure: A/V Fistulagram;  Surgeon: Conrad Iola, MD;  Location: [facility];  Service: Cardiovascular;  Laterality: Right;  . AV FISTULA PLACEMENT Left 10/22/2015   Procedure: RADIOCEPHALIC ARTERIOVENOUS (AV) FISTULA CREATION;  Surgeon: Serafina Mitchell, MD;  Location: [facility];  Service: Vascular;  Laterality: Left;  . AV FISTULA PLACEMENT Right 05/22/2017   Procedure: ARTERIOVENOUS (AV) FISTULA CREATION-RIGHT ARM;  Surgeon: Waynetta Sandy, MD;  Location: [facility];  Service: Vascular;  Laterality: Right;  . CANNULATION FOR CARDIOPULMONARY BYPASS N/A 07/08/2015   Procedure: CANNULATION FOR ECMO;  Surgeon: Rexene Alberts, MD;  Location: [facility];  Service: Open Heart Surgery;  Laterality: N/A;  . CARDIAC CATHETERIZATION    . CARDIAC CATHETERIZATION N/A 06/18/2015   Procedure: Right/Left Heart Cath and Coronary Angiography;  Surgeon: Jettie Booze,  MD;  Location: [facility];  Service: Cardiovascular;  Laterality: N/A;  . CESAREAN SECTION     x 2  . COLONOSCOPY    . EXCHANGE OF A DIALYSIS CATHETER N/A 06/28/2017   Procedure: EXCHANGE OF A DIALYSIS CATHETER;  Surgeon: Waynetta Sandy, MD;  Location: [facility];  Service: Vascular;  Laterality: N/A;  . FISTULA SUPERFICIALIZATION Left 12/28/2015   Procedure: SUPERFICIALIZATION LEFT RADIOCEPHALIC FISTULA;  Surgeon: Serafina Mitchell, MD;  Location: [facility];  Service: Vascular;  Laterality: Left;  . FISTULA SUPERFICIALIZATION Right 08/21/2017   Procedure: FISTULA SUPERFICIALIZATION RIGHT ARM;  Surgeon: Waynetta Sandy, MD;  Location: [facility];  Service: Vascular;  Laterality: Right;  . INSERTION OF DIALYSIS CATHETER Right 04/28/2017   Procedure: INSERTION OF DIALYSIS CATHETER-RIGHT INTERNAL JUGULAR PLACEMENT;  Surgeon: Elam Dutch, MD;  Location: [facility];  Service: Vascular;  Laterality: Right;  . LIGATION OF  ARTERIOVENOUS  FISTULA Left 04/28/2017   Procedure: LIGATION OF ARTERIOVENOUS  FISTULA;  Surgeon: Elam Dutch, MD;  Location: [facility];  Service: Vascular;  Laterality: Left;  . MITRAL VALVE REPAIR Right 07/08/2015   Procedure: MINIMALLY INVASIVE MITRAL VALVE REPAIR with a 26 Sorin Memo 3D Rechord;  Surgeon: Rexene Alberts, MD;  Location: [facility];  Service: Open Heart Surgery;  Laterality: Right;  . MULTIPLE EXTRACTIONS WITH ALVEOLOPLASTY N/A 06/30/2015   Procedure: MULTIPLE EXTRACTIONS OF TOOTH #'S 4  AND 30 WITH ALVEOLOPLASTY AND GROSS DEBRIDEMENT  OF REMAINING TEETH;  Surgeon: Lenn Cal, DDS;  Location: [facility];  Service: Oral Surgery;  Laterality: N/A;  . PERIPHERAL VASCULAR CATHETERIZATION Left 03/28/2016   Procedure: A/V Fistulagram;  Surgeon: Serafina Mitchell, MD;  Location: [facility];  Service: Cardiovascular;  Laterality: Left;  lower arm  . TEE WITHOUT CARDIOVERSION N/A 06/08/2015   Procedure: TRANSESOPHAGEAL ECHOCARDIOGRAM (TEE);  Surgeon: Lelon Perla, MD;  Location: [facility];  Service: Cardiovascular;  Laterality: N/A;  . TEE WITHOUT CARDIOVERSION N/A 07/08/2015   Procedure: TRANSESOPHAGEAL ECHOCARDIOGRAM (TEE);  Surgeon: Rexene Alberts, MD;  Location: [facility];  Service: Open Heart Surgery;  Laterality: N/A;   Family History  Problem Relation Age of Onset  . Cancer Sister        breast cancer  . Stroke Mother   . Heart attack Brother        MI in his 44s  . Heart disease Brother        before age 20  . Breast cancer Sister    Social History  Substance Use Topics  . Smoking status: Never Smoker  . Smokeless tobacco: Never Used  . Alcohol use No    ROS Constitutional: Denies fever, chills, weight loss/gain, headaches, insomnia,  night sweats, and change in appetite. Does c/o fatigue. Eyes: Denies redness, blurred vision, diplopia, discharge, itchy, watery eyes.  ENT: Denies discharge, congestion, post nasal drip, epistaxis, sore throat, earache, hearing loss,  dental pain, Tinnitus, Vertigo, Sinus pain, snoring.  Cardio: Denies chest pain, palpitations, irregular heartbeat, syncope, dyspnea, diaphoresis, orthopnea, PND, claudication, edema Respiratory: denies cough, dyspnea, DOE, pleurisy, hoarseness, laryngitis, wheezing.  Gastrointestinal: Denies dysphagia, heartburn, reflux, water brash, pain, cramps, nausea, vomiting, bloating, diarrhea, constipation, hematemesis, melena, hematochezia, jaundice, hemorrhoids Genitourinary: Denies dysuria, frequency, urgency, nocturia, hesitancy, discharge, hematuria, flank pain Breast: Breast lumps, nipple discharge, bleeding.  Musculoskeletal: Denies arthralgia, myalgia, stiffness, Jt. Swelling, pain, limp, and strain/sprain. Denies falls. Skin: Denies puritis, rash, hives, warts, acne, eczema, changing in skin lesion Neuro: No weakness,  tremor, incoordination, spasms, paresthesia, pain Psychiatric: Denies confusion, memory loss, sensory loss. Denies Depression. Endocrine: Denies change in weight, skin, hair change, nocturia, and paresthesia, diabetic polys, visual blurring, hyper / hypo glycemic episodes.  Heme/Lymph: No excessive bleeding, bruising, enlarged lymph nodes.  Physical Exam  BP 138/84   Pulse 76   Temp (!) 97.3 F (36.3 C)   Resp 18   Ht 5' (1.524 m)   Wt 200 lb (90.7 kg)   BMI 39.06 kg/m   General Appearance: Well nourished, well groomed and in no apparent distress.  Eyes: PERRLA, EOMs, conjunctiva no swelling or erythema, normal fundi and vessels. Sinuses: No frontal/maxillary tenderness ENT/Mouth: EACs patent / TMs  nl. Nares clear without erythema, swelling, mucoid exudates. Oral hygiene is good. No erythema, swelling, or exudate. Tongue normal, non-obstructing. Tonsils not swollen or erythematous. Hearing normal.  Neck: Supple, thyroid normal. No bruits, nodes or JVD. Respiratory: Respiratory effort normal.  BS equal and clear bilateral without rales, rhonci, wheezing or  stridor. Cardio: Heart sounds are normal with regular rate and rhythm and no murmurs, rubs or gallops. Peripheral pulses are normal and equal bilaterally without edema. No aortic or femoral bruits. Chest: symmetric with normal excursions and percussion. Breasts: Symmetric, without lumps, nipple discharge, retractions, or fibrocystic changes.  Abdomen: Flat, soft with bowel sounds active. Nontender, no guarding, rebound, hernias, masses, or organomegaly.  Lymphatics: Non tender without lymphadenopathy.  Genitourinary:  Musculoskeletal: Full ROM all peripheral extremities, joint stability, 5/5 strength, and normal gait. Skin: Warm and dry without rashes, lesions, cyanosis, clubbing or  ecchymosis.  Neuro: Cranial nerves intact, reflexes equal bilaterally. Normal muscle tone, no cerebellar symptoms.  Sensation intact to touch, vibratory and Monofilament to the toes bilaterally. Pysch: Alert and oriented X 3, normal affect, Insight and Judgment appropriate.   Assessment and Plan  1. Essential hypertension  - EKG 12-Lead - CBC with Differential/Platelet - BASIC METABOLIC PANEL WITH GFR - Magnesium - TSH  2. Hyperlipidemia, mixed  - EKG 12-Lead - Hepatic function panel - TSH  3. Insulin Dependent T2_DM  (HCC)  - EKG 12-Lead - HM DIABETES FOOT EXAM - LOW EXTREMITY NEUR EXAM DOCUM - Hemoglobin A1c  4. Vitamin D deficiency  - VITAMIN D 25 Hydroxy  5. ESRD on dialysis ([place])  - CBC with Differential/Platelet - BASIC METABOLIC PANEL WITH GFR  6. Insulin dependent diabetes with renal manifestation (HCC)  - BASIC METABOLIC PANEL WITH GFR  7. Cardiomyopathy ([place])  - EKG 12-Lead  8. Chronic diastolic heart failure (HCC)  - EKG 12-Lead  9. Screening for rectal cancer  - POC Hemoccult Bld/Stl   10. Screening for ischemic heart disease  - EKG 12-Lead  11. Medication management  - Uric acid - CBC with Differential/Platelet - BASIC METABOLIC PANEL WITH GFR - Hepatic  function panel - Magnesium - Lipid panel - TSH - Hemoglobin A1c - VITAMIN D 25 Hydroxy   12. S/P mitral valve repair   13. Need for prophylactic vaccination against Streptococcus pneumoniae (pneumococcus)  - Pneumococcal polysaccharide vaccine 23-valent greater than or equal to 2yo subcutaneous/IM       Patient was counseled in prudent diet to achieve/maintain BMI less than 25 for weight control, BP monitoring, regular exercise and medications. Discussed med's effects and SE's. Screening labs and tests as requested with regular follow-up as recommended. Over 40 minutes of exam, counseling, chart review and high complex critical decision making was performed.

## 2017-09-12 DIAGNOSIS — E118 Type 2 diabetes mellitus with unspecified complications: Secondary | ICD-10-CM | POA: Diagnosis not present

## 2017-09-12 DIAGNOSIS — D509 Iron deficiency anemia, unspecified: Secondary | ICD-10-CM | POA: Diagnosis not present

## 2017-09-12 DIAGNOSIS — N2581 Secondary hyperparathyroidism of renal origin: Secondary | ICD-10-CM | POA: Diagnosis not present

## 2017-09-12 DIAGNOSIS — N186 End stage renal disease: Secondary | ICD-10-CM | POA: Diagnosis not present

## 2017-09-12 DIAGNOSIS — D631 Anemia in chronic kidney disease: Secondary | ICD-10-CM | POA: Diagnosis not present

## 2017-09-12 LAB — LIPID PANEL
Cholesterol: 132 mg/dL (ref <200)
HDL: 49 mg/dL — ABNORMAL LOW (ref >50)
LDL Cholesterol (Calc): 54 mg/dL (calc)
Non-HDL Cholesterol (Calc): 83 mg/dL (calc) (ref <130)
Total CHOL/HDL Ratio: 2.7 (calc) (ref <5.0)
Triglycerides: 218 mg/dL — ABNORMAL HIGH (ref <150)

## 2017-09-12 LAB — BASIC METABOLIC PANEL WITH GFR
BUN/Creatinine Ratio: 6 (calc) (ref 6–22)
BUN: 37 mg/dL — ABNORMAL HIGH (ref 7–25)
CO2: 32 mmol/L (ref 20–32)
Calcium: 9.9 mg/dL (ref 8.6–10.4)
Chloride: 94 mmol/L — ABNORMAL LOW (ref 98–110)
Creat: 6.29 mg/dL — ABNORMAL HIGH (ref 0.50–0.99)
GFR, Est African American: 8 mL/min/{1.73_m2} — ABNORMAL LOW (ref >=60)
GFR, Est Non African American: 7 mL/min/{1.73_m2} — ABNORMAL LOW (ref >=60)
Glucose, Bld: 168 mg/dL — ABNORMAL HIGH (ref 65–99)
Potassium: 4.3 mmol/L (ref 3.5–5.3)
Sodium: 137 mmol/L (ref 135–146)

## 2017-09-12 LAB — CBC WITH DIFFERENTIAL/PLATELET
Basophils Absolute: 52 cells/uL (ref 0–200)
Basophils Relative: 0.9 %
Eosinophils Absolute: 278 cells/uL (ref 15–500)
Eosinophils Relative: 4.8 %
HCT: 35.5 % (ref 35.0–45.0)
Hemoglobin: 11.4 g/dL — ABNORMAL LOW (ref 11.7–15.5)
Lymphs Abs: 1682 cells/uL (ref 850–3900)
MCH: 27.1 pg (ref 27.0–33.0)
MCHC: 32.1 g/dL (ref 32.0–36.0)
MCV: 84.3 fL (ref 80.0–100.0)
MPV: 11.4 fL (ref 7.5–12.5)
Monocytes Relative: 12.8 %
Neutro Abs: 3045 cells/uL (ref 1500–7800)
Neutrophils Relative %: 52.5 %
Platelets: 277 10*3/uL (ref 140–400)
RBC: 4.21 10*6/uL (ref 3.80–5.10)
RDW: 18.4 % — ABNORMAL HIGH (ref 11.0–15.0)
Total Lymphocyte: 29 %
WBC mixed population: 742 cells/uL (ref 200–950)
WBC: 5.8 10*3/uL (ref 3.8–10.8)

## 2017-09-12 LAB — HEMOGLOBIN A1C
Hgb A1c MFr Bld: 7.6 % of total Hgb — ABNORMAL HIGH (ref <5.7)
Mean Plasma Glucose: 171 (calc)
eAG (mmol/L): 9.5 (calc)

## 2017-09-12 LAB — URIC ACID: Uric Acid, Serum: 5.7 mg/dL (ref 2.5–7.0)

## 2017-09-12 LAB — HEPATIC FUNCTION PANEL
AG Ratio: 1.2 (calc) (ref 1.0–2.5)
ALT: 11 U/L (ref 6–29)
AST: 15 U/L (ref 10–35)
Albumin: 4.4 g/dL (ref 3.6–5.1)
Alkaline phosphatase (APISO): 72 U/L (ref 33–130)
Bilirubin, Direct: 0.1 mg/dL (ref 0.0–0.2)
Globulin: 3.7 g/dL (calc) (ref 1.9–3.7)
Indirect Bilirubin: 0.3 mg/dL (calc) (ref 0.2–1.2)
Total Bilirubin: 0.4 mg/dL (ref 0.2–1.2)
Total Protein: 8.1 g/dL (ref 6.1–8.1)

## 2017-09-12 LAB — MAGNESIUM: Magnesium: 2.2 mg/dL (ref 1.5–2.5)

## 2017-09-12 LAB — TSH: TSH: 2.28 mIU/L (ref 0.40–4.50)

## 2017-09-12 LAB — VITAMIN D 25 HYDROXY (VIT D DEFICIENCY, FRACTURES): Vit D, 25-Hydroxy: 30 ng/mL (ref 30–100)

## 2017-09-13 ENCOUNTER — Ambulatory Visit (INDEPENDENT_AMBULATORY_CARE_PROVIDER_SITE_OTHER): Payer: Medicare Other | Admitting: Physician Assistant

## 2017-09-13 ENCOUNTER — Encounter: Payer: Self-pay | Admitting: Physician Assistant

## 2017-09-13 VITALS — BP 144/88 | HR 98 | Ht 60.0 in | Wt 198.8 lb

## 2017-09-13 DIAGNOSIS — I10 Essential (primary) hypertension: Secondary | ICD-10-CM | POA: Diagnosis not present

## 2017-09-13 DIAGNOSIS — I5032 Chronic diastolic (congestive) heart failure: Secondary | ICD-10-CM

## 2017-09-13 DIAGNOSIS — Z992 Dependence on renal dialysis: Secondary | ICD-10-CM

## 2017-09-13 DIAGNOSIS — E119 Type 2 diabetes mellitus without complications: Secondary | ICD-10-CM | POA: Diagnosis not present

## 2017-09-13 DIAGNOSIS — Z794 Long term (current) use of insulin: Secondary | ICD-10-CM | POA: Diagnosis not present

## 2017-09-13 DIAGNOSIS — E785 Hyperlipidemia, unspecified: Secondary | ICD-10-CM | POA: Diagnosis not present

## 2017-09-13 DIAGNOSIS — I2510 Atherosclerotic heart disease of native coronary artery without angina pectoris: Secondary | ICD-10-CM | POA: Diagnosis not present

## 2017-09-13 DIAGNOSIS — Z9889 Other specified postprocedural states: Secondary | ICD-10-CM | POA: Diagnosis not present

## 2017-09-13 DIAGNOSIS — N186 End stage renal disease: Secondary | ICD-10-CM

## 2017-09-13 NOTE — Patient Instructions (Signed)
Medication Instructions:   No change to current medications.  Labwork:   none  Testing/Procedures:  none  Follow-Up:  Return to see Dr. Stanford Breed in 1 year, or sooner if needed. We will send you a reminder letter in the mail when it is time to schedule an appointment.   If you need a refill on your cardiac medications before your next appointment, please call your pharmacy.

## 2017-09-13 NOTE — Progress Notes (Signed)
Cardiology Office Note    Date:  09/14/2017   ID:  EASTON SIVERTSON, DOB 06-21-1956, MRN [id number]  PCP:  Unk Pinto, MD  Cardiologist:  Dr. Stanford Breed  Nephrology: Dr. Joelyn Oms  Chief Complaint  Patient presents with  . Follow-up    Seen for Dr. Stanford Breed    History of Present Illness:  Jody Nguyen is a 61 y.o. female with PMH of ESRD on HD MWF, chronic diastolic heart failure, h/o CVA, DM II, HLD, HTN, chronic pericardial effusion, pulmonary hypertension, minimal CAD and severe MR s/p minimally invasive mitral repair. Renal Dopplers in November 2013 showed no renal artery stenosis. Previous pericardial effusion with some minoxidil. TE in July 2016 showed normal LV function, moderate to severe left atrial enlargement, small pericardial effusion, flail segment of anterior mitral valve was severe MR and mild TR. Cardiac catheterization in September 2016 showed a nonobstructive CAD with severely elevated pulmonary pressure. Carotid Doppler in August 2016 showed mild disease bilaterally. He underwent mitral valve repair in August 2016. Postop course was complicated by acute hypoxic respiratory failure refractory to conventional ventilatory support. He was transferred to [facility] for ECMO. Course also complicated for the need for tracheostomy. She developed acute on chronic renal failure and requires dialysis. Follow-up echo at [facility] showed ejection fraction 40%. Since the last time she was seen by Dr. Stanford Breed in 2017, she has underwent stress echocardiogram at [facility] on 12/12/2016 which showed EF greater than 55%, normal stress test with no wall motion abnormality at rest and with peak stress, trivial MR, trivial PR, trivial TR, maximum workload 4.6 METs was achieved during exercise. This was done as part of preoperative clearance prior to her renal transplant.   She went to the ED on 01/22/2017 for bleeding from dialysis site. This recurred in May 2018 when she suddenly had bleeding from her left  distal upper extremity fistula, this resulted in near syncope. Her left radiocephalic AV fistula was ligated on 04/28/2017. She underwent creation of right arm cephalic AV fistula on 4/31/5400, unfortunately this new AV fistula matured very poorly.  She has been doing well since the last time we saw her. Denying any exertional chest pain or shortness breath. She is undergoing dialysis every Monday Wednesday and Friday. Her left arm fistula has been ligated, right arm fistula is maturing. She is undergoing dialysis via a dialysis catheter on her right upper pectoral side. Although initial blood pressure today was 144/88. After talking to her for 10 minutes, her blood pressure came down to 134/80. Her blood pressure frequently dropped down to 105 during dialysis, I will hold off on adjusting her blood pressure medication. Interestingly, although her HDL cholesterol and triglyceride were very well-controlled 3 month ago, she had another lipid panel 2 days ago on the same medication that showed elevation of the triglyceride. It seems her primary care provider has recommended dietary changes.   Past Medical History:  Diagnosis Date  . Anemia   . Chronic diastolic CHF (congestive heart failure) ([place])   . Chronic diastolic heart failure ([place])   . CKD (chronic kidney disease) stage 4, GFR 15-29 ml/min (HCC)    dialysis M/W/F  . Constipation   . CVA (cerebral infarction) 1997   no residual deficit  . Diverticulitis   . DM (diabetes mellitus) ([place])    type 2  . Heart murmur   . History of cardiovascular stress test    a. Myoview Oct 2012 showed EF 49%, no ischemia, LVE  . HLD (  hyperlipidemia)    takes Crestor daily  . Hypertension   . Pericardial effusion    chronic; felt to be poss related to minoxidil >> DC'd  . Pulmonary hypertension ([place]) 06/18/2015  . Respiratory failure, acute hypoxic, post-operative 07/08/2015   Requiring ECMO support  . S/P cardiac catheterization    a. R/L HC 06/18/15:  mLAD  30%; severe pulmo HTN with PA sat 43%, CI 1.86, prominent V waves indicative of MR; resting hypoxemia O2 sat 86% on RA  . S/P minimally invasive mitral valve repair 07/08/2015   Complex valvuloplasty including artificial Gore-tex neochord placement x10 and 26 mm Sorin Memo 3D Rechord ring annuloplasty via right mini thoracotomy approach  . Severe mitral regurgitation   . Shortness of breath dyspnea   . Stroke ([place]) 1997   no residual effect  . Vitamin D deficiency   . Wears glasses     Past Surgical History:  Procedure Laterality Date  . A/V FISTULAGRAM Right 08/16/2017   Procedure: A/V Fistulagram;  Surgeon: Conrad Colfax, MD;  Location: [place] [facility];  Service: Cardiovascular;  Laterality: Right;  . AV FISTULA PLACEMENT Left 10/22/2015   Procedure: RADIOCEPHALIC ARTERIOVENOUS (AV) FISTULA CREATION;  Surgeon: Serafina Mitchell, MD;  Location: [place];  Service: Vascular;  Laterality: Left;  . AV FISTULA PLACEMENT Right 05/22/2017   Procedure: ARTERIOVENOUS (AV) FISTULA CREATION-RIGHT ARM;  Surgeon: Waynetta Sandy, MD;  Location: [place];  Service: Vascular;  Laterality: Right;  . CANNULATION FOR CARDIOPULMONARY BYPASS N/A 07/08/2015   Procedure: CANNULATION FOR ECMO;  Surgeon: Rexene Alberts, MD;  Location: [place];  Service: Open Heart Surgery;  Laterality: N/A;  . CARDIAC CATHETERIZATION    . CARDIAC CATHETERIZATION N/A 06/18/2015   Procedure: Right/Left Heart Cath and Coronary Angiography;  Surgeon: Jettie Booze, MD;  Location: [place] [facility];  Service: Cardiovascular;  Laterality: N/A;  . CESAREAN SECTION     x 2  . COLONOSCOPY    . EXCHANGE OF A DIALYSIS CATHETER N/A 06/28/2017   Procedure: EXCHANGE OF A DIALYSIS CATHETER;  Surgeon: Waynetta Sandy, MD;  Location: [place];  Service: Vascular;  Laterality: N/A;  . FISTULA SUPERFICIALIZATION Left 12/28/2015   Procedure: SUPERFICIALIZATION LEFT RADIOCEPHALIC FISTULA;  Surgeon: Serafina Mitchell, MD;  Location: [place];  Service: Vascular;  Laterality: Left;  . FISTULA SUPERFICIALIZATION Right 08/21/2017   Procedure: FISTULA SUPERFICIALIZATION RIGHT ARM;  Surgeon: Waynetta Sandy, MD;  Location: [place];  Service: Vascular;  Laterality: Right;  . INSERTION OF DIALYSIS CATHETER Right 04/28/2017   Procedure: INSERTION OF DIALYSIS CATHETER-RIGHT INTERNAL JUGULAR PLACEMENT;  Surgeon: Elam Dutch, MD;  Location: [place];  Service: Vascular;  Laterality: Right;  . LIGATION OF ARTERIOVENOUS  FISTULA Left 04/28/2017   Procedure: LIGATION OF ARTERIOVENOUS  FISTULA;  Surgeon: Elam Dutch, MD;  Location: [place];  Service: Vascular;  Laterality: Left;  . MITRAL VALVE REPAIR Right 07/08/2015   Procedure: MINIMALLY INVASIVE MITRAL VALVE REPAIR with a 26 Sorin Memo 3D Rechord;  Surgeon: Rexene Alberts, MD;  Location: [place];  Service: Open Heart Surgery;  Laterality: Right;  . MULTIPLE EXTRACTIONS WITH ALVEOLOPLASTY N/A 06/30/2015   Procedure: MULTIPLE EXTRACTIONS OF TOOTH #'S 4  AND 30 WITH ALVEOLOPLASTY AND GROSS DEBRIDEMENT  OF REMAINING TEETH;  Surgeon: Lenn Cal, DDS;  Location: [place];  Service: Oral Surgery;  Laterality: N/A;  . PERIPHERAL VASCULAR CATHETERIZATION Left 03/28/2016   Procedure: A/V Fistulagram;  Surgeon: Butch Penny  Trula Slade, MD;  Location: [place] [facility];  Service: Cardiovascular;  Laterality: Left;  lower arm  . TEE WITHOUT CARDIOVERSION N/A 06/08/2015   Procedure: TRANSESOPHAGEAL ECHOCARDIOGRAM (TEE);  Surgeon: Lelon Perla, MD;  Location: [facility];  Service: Cardiovascular;  Laterality: N/A;  . TEE WITHOUT CARDIOVERSION N/A 07/08/2015   Procedure: TRANSESOPHAGEAL ECHOCARDIOGRAM (TEE);  Surgeon: Rexene Alberts, MD;  Location: [place];  Service: Open Heart Surgery;  Laterality: N/A;    Current Medications: Outpatient Medications Prior to Visit  Medication Sig Dispense Refill  . acetaminophen (TYLENOL) 500 MG tablet Take 1,000 mg by mouth every 6 (six) hours as needed (for pain.).     Marland Kitchen azelastine (ASTELIN) 0.1 % nasal spray Place 2 sprays into both nostrils 2 (two) times daily. Use in each nostril as directed (Patient taking differently: Place 2 sprays into both nostrils 2 (two) times daily as needed (for allergies.). Use in each nostril as directed) 30 mL 2  . busPIRone (BUSPAR) 10 MG tablet Take 1 tablet (10 mg total) by mouth 2 (two) times daily. 180 tablet 1  . carvedilol (COREG) 6.25 MG tablet Take 1 tablet (6.25 mg total) by mouth 2 (two) times daily. 180 tablet 1  . cinacalcet (SENSIPAR) 60 MG tablet Take 1 tablet (60 mg total) by mouth daily. 90 tablet 1  . insulin aspart protamine - aspart (NOVOLOG MIX 70/30 FLEXPEN) (70-30) 100 UNIT/ML FlexPen Inject 0.1 mLs (10 Units total) into the skin 2 (two) times daily. (Patient taking differently: Inject 10 Units into the skin 2 (two) times daily as needed (if blood sugar is above 180). ) 15 mL 3  . losartan (COZAAR) 50 MG tablet Take 1 tablet (50 mg total) by mouth daily. 90 tablet 1  . oxyCODONE-acetaminophen (ROXICET) 5-325 MG tablet Take 1-2 tablets by mouth every 6 (six) hours as needed. 20 tablet 0  . RENVELA 800 MG tablet Take 1,600-4,000 mg by mouth 3 (three) times daily. 4000 mg (5 caps) with meals and 1600 mg (2 caps) with snacks  5  . rosuvastatin (CRESTOR) 20 MG tablet Take 1 tablet (20 mg total) by mouth daily. 90 tablet 1  . sertraline (ZOLOFT) 25 MG tablet Take 1 tablet (25 mg total) by mouth daily. 90 tablet 1   No facility-administered medications prior to visit.      Allergies:   Minoxidil; Lipitor [atorvastatin]; and Ace inhibitors   Social History   Social History  . Marital status: Married    Spouse name: N/A  . Number of children: 3  . Years of education: N/A   Occupational History  .      Unemployed; used to work as a Radio broadcast assistant   Social History Main Topics  . Smoking status: Never Smoker  . Smokeless tobacco: Never Used  . Alcohol use No  . Drug use: No  . Sexual  activity: Not Asked   Other Topics Concern  . None   Social History Narrative  . None     Family History:  The patient's family history includes Breast cancer in her sister; Cancer in her sister; Heart attack in her brother; Heart disease in her brother; Stroke in her mother.   ROS:   Please see the history of present illness.    ROS All other systems reviewed and are negative.   PHYSICAL EXAM:   VS:  BP (!) 144/88   Pulse 98   Ht 5' (1.524 m)   Wt 198 lb 12.8 oz (90.2  kg)   BMI 38.83 kg/m    GEN: Well nourished, well developed, in no acute distress  HEENT: normal  Neck: no JVD, carotid bruits, or masses Cardiac: RRR; no murmurs, rubs, or gallops,no edema  Respiratory:  clear to auscultation bilaterally, normal work of breathing GI: soft, nontender, nondistended, + BS MS: no deformity or atrophy  Skin: warm and dry, no rash Neuro:  Alert and Oriented x 3, Strength and sensation are intact Psych: euthymic mood, full affect  Wt Readings from Last 3 Encounters:  09/14/17 194 lb (88 kg)  09/13/17 198 lb 12.8 oz (90.2 kg)  09/11/17 200 lb (90.7 kg)      Studies/Labs Reviewed:   EKG:  EKG is not ordered today.    Recent Labs: 09/11/2017: ALT 11; BUN 37; Creat 6.29; Hemoglobin 11.4; Magnesium 2.2; Platelets 277; Potassium 4.3; Sodium 137; TSH 2.28   Lipid Panel    Component Value Date/Time   CHOL 132 09/11/2017 1531   TRIG 218 (H) 09/11/2017 1531   HDL 49 (L) 09/11/2017 1531   CHOLHDL 2.7 09/11/2017 1531   VLDL 25 06/07/2017 1006   LDLCALC 38 06/07/2017 1006    Additional studies/ records that were reviewed today include:   Cath 06/18/2015 Conclusion    Nonobstructive coronary artery disease.  Severe pulmonary artery hypertension. PA saturation 43%. Cardiac index 1.86. Prominent V waves indicative of mitral regurgitation.  Resting hypoxemia with oxygen saturation 86% on room air.  Systemic hypertension   Discussed the case with Dr. Stanford Breed. Will admit  the patient. She will need to be diuresed as her renal function allows. Hopefully, this will help with her decreased oxygen saturations and pulmonary hypertension. Further consultation with Dr. Roxy Manns to be obtained for mitral valve repair.      Echo 08/17/2016 LV EF: 55% -   60%  Study Conclusions  - Left ventricle: The cavity size was normal. Wall thickness was   increased in a pattern of moderate LVH. Systolic function was   normal. The estimated ejection fraction was in the range of 55%   to 60%. Doppler parameters are consistent with abnormal left   ventricular relaxation (grade 1 diastolic dysfunction). Doppler   parameters are consistent with high ventricular filling pressure. - Mitral valve: MV is thckened with some restricted motion Chordae   are thickened, fibrotic Difficult to see with annular   calcification Peak and mean gradients through the valve are 12   and 5 mm Hg respectively MVA by PT1/2 is 2.37 cm2 AVlave area by   continuty equestion is 2 cm2. Moderately to severely calcified   annulus. Moderately thickened leaflets . Valve area by continuity   equation (using LVOT flow): 2 cm^2. - Left atrium: The atrium was mildly dilated. - Right ventricle: Systolic function was mildly reduced. - Pulmonary arteries: PA peak pressure: 40 mm Hg (S).   Stress echo 12/12/2016   NORMAL STRESS TEST. NORMAL RESTING STUDY WITH NO WALL MOTION ABNORMALITIES  AT REST AND PEAK STRESS.  VALVULAR REGURGITATION: TRIVIAL MR, TRIVIAL PR, TRIVIAL TR  Note: NO WALL MOTION ABNORMALITIES AT LESS THAN TARGET HEART RATE OF  122bpm (TARGET = 135bpm)  Maximum workload of4.60 METs was achieved during exercise.  NORMAL RESTING BP - APPROPRIATE RESPONSE   ASSESSMENT:    1. Coronary artery disease involving native coronary artery of native heart without angina pectoris   2. H/O mitral valve repair   3. Chronic diastolic heart failure ([place])   4. ESRD on hemodialysis ([place])  5. Essential  hypertension   6. Hyperlipidemia, unspecified hyperlipidemia type   7. Controlled type 2 diabetes mellitus without complication, with long-term current use of insulin (HCC)      PLAN:  In order of problems listed above:  1. CAD: History of nonobstructive CAD, underwent stress echocardiogram at [facility] in early 2018 which came back normal.  2. Chronic diastolic heart failure: Euvolemic on exam. Controlled via hemodialysis  3. ESRD on HD MWF: Managed per Dr. Joelyn Oms  4. History of mitral valve repair: Stable on exam, trivial amount of regurgitation during stress echo  5. Hypertension: Initial blood pressure was mildly elevated, however manual blood pressure after 10 minutes showed systolic blood pressure measured in the 130s. Will not aggressively control her blood pressure as her blood pressure tend to drop during dialysis.  6. Hyperlipidemia: On Crestor 20 mg daily. Although recent lab work shows elevated triglyceride, however lab work from 3 month ago on the same medication showed very well-controlled triglyceride.  7. DM 2: On insulin    Medication Adjustments/Labs and Tests Ordered: Current medicines are reviewed at length with the patient today.  Concerns regarding medicines are outlined above.  Medication changes, Labs and Tests ordered today are listed in the Patient Instructions below. Patient Instructions  Medication Instructions:   No change to current medications.  Labwork:   none  Testing/Procedures:  none  Follow-Up:  Return to see Dr. Stanford Breed in 1 year, or sooner if needed. We will send you a reminder letter in the mail when it is time to schedule an appointment.   If you need a refill on your cardiac medications before your next appointment, please call your pharmacy.    Hilbert Corrigan, [place]  09/14/2017 8:40 PM    [facility] [address], [place], [place]  [address] Phone: [phone]; Fax: [phone]

## 2017-09-14 ENCOUNTER — Encounter: Payer: Self-pay | Admitting: Physician Assistant

## 2017-09-14 ENCOUNTER — Encounter: Payer: Self-pay | Admitting: Family

## 2017-09-14 ENCOUNTER — Ambulatory Visit (INDEPENDENT_AMBULATORY_CARE_PROVIDER_SITE_OTHER): Payer: Self-pay | Admitting: Family

## 2017-09-14 VITALS — BP 133/78 | HR 94 | Temp 98.3°F | Resp 116 | Ht 60.0 in | Wt 194.0 lb

## 2017-09-14 DIAGNOSIS — N186 End stage renal disease: Secondary | ICD-10-CM

## 2017-09-14 DIAGNOSIS — D509 Iron deficiency anemia, unspecified: Secondary | ICD-10-CM | POA: Diagnosis not present

## 2017-09-14 DIAGNOSIS — N2581 Secondary hyperparathyroidism of renal origin: Secondary | ICD-10-CM | POA: Diagnosis not present

## 2017-09-14 DIAGNOSIS — Z992 Dependence on renal dialysis: Secondary | ICD-10-CM

## 2017-09-14 DIAGNOSIS — E118 Type 2 diabetes mellitus with unspecified complications: Secondary | ICD-10-CM | POA: Diagnosis not present

## 2017-09-14 DIAGNOSIS — D631 Anemia in chronic kidney disease: Secondary | ICD-10-CM | POA: Diagnosis not present

## 2017-09-14 DIAGNOSIS — I770 Arteriovenous fistula, acquired: Secondary | ICD-10-CM

## 2017-09-14 NOTE — Progress Notes (Signed)
    Postoperative Access Visit   History of Present Illness  Jody Nguyen is a 61 y.o. year old female who is s/p superficialization and branch ligation of right arm radiocephalic AV fistula on 1-79-15 by Dr. Donzetta Matters.  She recently underwent a fistulogram which demonstrated adequate flow but small branches and likely was too deep for use.  Findings: Patient with adequate size throughout the entire was almost a centimeter deep at points. There were 3 large branches were ligated between ties. At completion of the palpable thrill and it is beneath the skin.  She returns today for 2-3 weeks wound check.   She dialyzes M-W-F at [facility], [place], via right chest catheter.   The patient's wounds are healed.  The patient notes no steal symptoms.  The patient is able to complete their activities of daily living.  The patient's current symptoms are: none.  For VQI Use Only  PRE-ADM LIVING: Home  AMB STATUS: Ambulatory  Physical Examination Vitals:   09/14/17 1203  BP: 133/78  Pulse: 94  Resp: (!) 116  Temp: 98.3 F (36.8 C)  SpO2: 92%  Weight: 194 lb (88 kg)  Height: 5' (1.524 m)   Body mass index is 37.89 kg/m.  Right UE: Incision is healed, skin feels warm and normal, hand grip is 5/5, sensation in digits is intact, palpable thrill, bruit can be auscultated   Medical Decision Making  Jody Nguyen is a 60 y.o. year old female who presents s/p s/p superficialization and branch ligation of right arm radiocephalic AV fistula on 0-56-97.  Dr. Donzetta Matters spoke with pt and friend, and examined pt. Dialysis personnel may access AV fistula on 09-19-17.   The patient's tunneled dialysis catheter can be removed after two successful cannulations and completed dialysis treatments.  Follow up with [place] as needed.   Thank you for allowing [place] to participate in this patient's care.  NICKEL, Sharmon Leyden, RN, MSN, FNP-C [facility] Office:  [phone]  09/14/2017, 12:31 PM  Clinic MD: Cain/[place]

## 2017-09-17 DIAGNOSIS — E118 Type 2 diabetes mellitus with unspecified complications: Secondary | ICD-10-CM | POA: Diagnosis not present

## 2017-09-17 DIAGNOSIS — N186 End stage renal disease: Secondary | ICD-10-CM | POA: Diagnosis not present

## 2017-09-17 DIAGNOSIS — D631 Anemia in chronic kidney disease: Secondary | ICD-10-CM | POA: Diagnosis not present

## 2017-09-17 DIAGNOSIS — D509 Iron deficiency anemia, unspecified: Secondary | ICD-10-CM | POA: Diagnosis not present

## 2017-09-17 DIAGNOSIS — N2581 Secondary hyperparathyroidism of renal origin: Secondary | ICD-10-CM | POA: Diagnosis not present

## 2017-09-19 DIAGNOSIS — D509 Iron deficiency anemia, unspecified: Secondary | ICD-10-CM | POA: Diagnosis not present

## 2017-09-19 DIAGNOSIS — N2581 Secondary hyperparathyroidism of renal origin: Secondary | ICD-10-CM | POA: Diagnosis not present

## 2017-09-19 DIAGNOSIS — D631 Anemia in chronic kidney disease: Secondary | ICD-10-CM | POA: Diagnosis not present

## 2017-09-19 DIAGNOSIS — E118 Type 2 diabetes mellitus with unspecified complications: Secondary | ICD-10-CM | POA: Diagnosis not present

## 2017-09-19 DIAGNOSIS — N186 End stage renal disease: Secondary | ICD-10-CM | POA: Diagnosis not present

## 2017-09-21 DIAGNOSIS — D509 Iron deficiency anemia, unspecified: Secondary | ICD-10-CM | POA: Diagnosis not present

## 2017-09-21 DIAGNOSIS — N186 End stage renal disease: Secondary | ICD-10-CM | POA: Diagnosis not present

## 2017-09-21 DIAGNOSIS — N2581 Secondary hyperparathyroidism of renal origin: Secondary | ICD-10-CM | POA: Diagnosis not present

## 2017-09-21 DIAGNOSIS — D631 Anemia in chronic kidney disease: Secondary | ICD-10-CM | POA: Diagnosis not present

## 2017-09-21 DIAGNOSIS — E118 Type 2 diabetes mellitus with unspecified complications: Secondary | ICD-10-CM | POA: Diagnosis not present

## 2017-09-24 DIAGNOSIS — N186 End stage renal disease: Secondary | ICD-10-CM | POA: Diagnosis not present

## 2017-09-24 DIAGNOSIS — E118 Type 2 diabetes mellitus with unspecified complications: Secondary | ICD-10-CM | POA: Diagnosis not present

## 2017-09-24 DIAGNOSIS — D509 Iron deficiency anemia, unspecified: Secondary | ICD-10-CM | POA: Diagnosis not present

## 2017-09-24 DIAGNOSIS — N2581 Secondary hyperparathyroidism of renal origin: Secondary | ICD-10-CM | POA: Diagnosis not present

## 2017-09-24 DIAGNOSIS — D631 Anemia in chronic kidney disease: Secondary | ICD-10-CM | POA: Diagnosis not present

## 2017-09-26 DIAGNOSIS — Z992 Dependence on renal dialysis: Secondary | ICD-10-CM | POA: Diagnosis not present

## 2017-09-26 DIAGNOSIS — D509 Iron deficiency anemia, unspecified: Secondary | ICD-10-CM | POA: Diagnosis not present

## 2017-09-26 DIAGNOSIS — E118 Type 2 diabetes mellitus with unspecified complications: Secondary | ICD-10-CM | POA: Diagnosis not present

## 2017-09-26 DIAGNOSIS — D631 Anemia in chronic kidney disease: Secondary | ICD-10-CM | POA: Diagnosis not present

## 2017-09-26 DIAGNOSIS — E1129 Type 2 diabetes mellitus with other diabetic kidney complication: Secondary | ICD-10-CM | POA: Diagnosis not present

## 2017-09-26 DIAGNOSIS — N2581 Secondary hyperparathyroidism of renal origin: Secondary | ICD-10-CM | POA: Diagnosis not present

## 2017-09-26 DIAGNOSIS — N186 End stage renal disease: Secondary | ICD-10-CM | POA: Diagnosis not present

## 2017-09-28 DIAGNOSIS — E118 Type 2 diabetes mellitus with unspecified complications: Secondary | ICD-10-CM | POA: Diagnosis not present

## 2017-09-28 DIAGNOSIS — N186 End stage renal disease: Secondary | ICD-10-CM | POA: Diagnosis not present

## 2017-09-28 DIAGNOSIS — D631 Anemia in chronic kidney disease: Secondary | ICD-10-CM | POA: Diagnosis not present

## 2017-09-28 DIAGNOSIS — N2581 Secondary hyperparathyroidism of renal origin: Secondary | ICD-10-CM | POA: Diagnosis not present

## 2017-10-01 DIAGNOSIS — N2581 Secondary hyperparathyroidism of renal origin: Secondary | ICD-10-CM | POA: Diagnosis not present

## 2017-10-01 DIAGNOSIS — E118 Type 2 diabetes mellitus with unspecified complications: Secondary | ICD-10-CM | POA: Diagnosis not present

## 2017-10-01 DIAGNOSIS — N186 End stage renal disease: Secondary | ICD-10-CM | POA: Diagnosis not present

## 2017-10-01 DIAGNOSIS — D631 Anemia in chronic kidney disease: Secondary | ICD-10-CM | POA: Diagnosis not present

## 2017-10-03 DIAGNOSIS — D631 Anemia in chronic kidney disease: Secondary | ICD-10-CM | POA: Diagnosis not present

## 2017-10-03 DIAGNOSIS — N186 End stage renal disease: Secondary | ICD-10-CM | POA: Diagnosis not present

## 2017-10-03 DIAGNOSIS — E118 Type 2 diabetes mellitus with unspecified complications: Secondary | ICD-10-CM | POA: Diagnosis not present

## 2017-10-03 DIAGNOSIS — N2581 Secondary hyperparathyroidism of renal origin: Secondary | ICD-10-CM | POA: Diagnosis not present

## 2017-10-05 DIAGNOSIS — N2581 Secondary hyperparathyroidism of renal origin: Secondary | ICD-10-CM | POA: Diagnosis not present

## 2017-10-05 DIAGNOSIS — E118 Type 2 diabetes mellitus with unspecified complications: Secondary | ICD-10-CM | POA: Diagnosis not present

## 2017-10-05 DIAGNOSIS — D631 Anemia in chronic kidney disease: Secondary | ICD-10-CM | POA: Diagnosis not present

## 2017-10-05 DIAGNOSIS — N186 End stage renal disease: Secondary | ICD-10-CM | POA: Diagnosis not present

## 2017-10-08 DIAGNOSIS — E118 Type 2 diabetes mellitus with unspecified complications: Secondary | ICD-10-CM | POA: Diagnosis not present

## 2017-10-08 DIAGNOSIS — N2581 Secondary hyperparathyroidism of renal origin: Secondary | ICD-10-CM | POA: Diagnosis not present

## 2017-10-08 DIAGNOSIS — D631 Anemia in chronic kidney disease: Secondary | ICD-10-CM | POA: Diagnosis not present

## 2017-10-08 DIAGNOSIS — N186 End stage renal disease: Secondary | ICD-10-CM | POA: Diagnosis not present

## 2017-10-10 DIAGNOSIS — N2581 Secondary hyperparathyroidism of renal origin: Secondary | ICD-10-CM | POA: Diagnosis not present

## 2017-10-10 DIAGNOSIS — N186 End stage renal disease: Secondary | ICD-10-CM | POA: Diagnosis not present

## 2017-10-10 DIAGNOSIS — E118 Type 2 diabetes mellitus with unspecified complications: Secondary | ICD-10-CM | POA: Diagnosis not present

## 2017-10-10 DIAGNOSIS — D631 Anemia in chronic kidney disease: Secondary | ICD-10-CM | POA: Diagnosis not present

## 2017-10-12 DIAGNOSIS — D631 Anemia in chronic kidney disease: Secondary | ICD-10-CM | POA: Diagnosis not present

## 2017-10-12 DIAGNOSIS — N2581 Secondary hyperparathyroidism of renal origin: Secondary | ICD-10-CM | POA: Diagnosis not present

## 2017-10-12 DIAGNOSIS — N186 End stage renal disease: Secondary | ICD-10-CM | POA: Diagnosis not present

## 2017-10-12 DIAGNOSIS — E118 Type 2 diabetes mellitus with unspecified complications: Secondary | ICD-10-CM | POA: Diagnosis not present

## 2017-10-14 DIAGNOSIS — E118 Type 2 diabetes mellitus with unspecified complications: Secondary | ICD-10-CM | POA: Diagnosis not present

## 2017-10-14 DIAGNOSIS — D631 Anemia in chronic kidney disease: Secondary | ICD-10-CM | POA: Diagnosis not present

## 2017-10-14 DIAGNOSIS — N2581 Secondary hyperparathyroidism of renal origin: Secondary | ICD-10-CM | POA: Diagnosis not present

## 2017-10-14 DIAGNOSIS — N186 End stage renal disease: Secondary | ICD-10-CM | POA: Diagnosis not present

## 2017-10-16 DIAGNOSIS — E118 Type 2 diabetes mellitus with unspecified complications: Secondary | ICD-10-CM | POA: Diagnosis not present

## 2017-10-16 DIAGNOSIS — D631 Anemia in chronic kidney disease: Secondary | ICD-10-CM | POA: Diagnosis not present

## 2017-10-16 DIAGNOSIS — N186 End stage renal disease: Secondary | ICD-10-CM | POA: Diagnosis not present

## 2017-10-16 DIAGNOSIS — N2581 Secondary hyperparathyroidism of renal origin: Secondary | ICD-10-CM | POA: Diagnosis not present

## 2017-10-19 DIAGNOSIS — E118 Type 2 diabetes mellitus with unspecified complications: Secondary | ICD-10-CM | POA: Diagnosis not present

## 2017-10-19 DIAGNOSIS — N186 End stage renal disease: Secondary | ICD-10-CM | POA: Diagnosis not present

## 2017-10-19 DIAGNOSIS — D631 Anemia in chronic kidney disease: Secondary | ICD-10-CM | POA: Diagnosis not present

## 2017-10-19 DIAGNOSIS — N2581 Secondary hyperparathyroidism of renal origin: Secondary | ICD-10-CM | POA: Diagnosis not present

## 2017-10-22 DIAGNOSIS — N2581 Secondary hyperparathyroidism of renal origin: Secondary | ICD-10-CM | POA: Diagnosis not present

## 2017-10-22 DIAGNOSIS — N186 End stage renal disease: Secondary | ICD-10-CM | POA: Diagnosis not present

## 2017-10-22 DIAGNOSIS — D631 Anemia in chronic kidney disease: Secondary | ICD-10-CM | POA: Diagnosis not present

## 2017-10-22 DIAGNOSIS — E118 Type 2 diabetes mellitus with unspecified complications: Secondary | ICD-10-CM | POA: Diagnosis not present

## 2017-10-24 DIAGNOSIS — D631 Anemia in chronic kidney disease: Secondary | ICD-10-CM | POA: Diagnosis not present

## 2017-10-24 DIAGNOSIS — N2581 Secondary hyperparathyroidism of renal origin: Secondary | ICD-10-CM | POA: Diagnosis not present

## 2017-10-24 DIAGNOSIS — E118 Type 2 diabetes mellitus with unspecified complications: Secondary | ICD-10-CM | POA: Diagnosis not present

## 2017-10-24 DIAGNOSIS — N186 End stage renal disease: Secondary | ICD-10-CM | POA: Diagnosis not present

## 2017-10-26 DIAGNOSIS — N2581 Secondary hyperparathyroidism of renal origin: Secondary | ICD-10-CM | POA: Diagnosis not present

## 2017-10-26 DIAGNOSIS — N186 End stage renal disease: Secondary | ICD-10-CM | POA: Diagnosis not present

## 2017-10-26 DIAGNOSIS — D631 Anemia in chronic kidney disease: Secondary | ICD-10-CM | POA: Diagnosis not present

## 2017-10-26 DIAGNOSIS — E1129 Type 2 diabetes mellitus with other diabetic kidney complication: Secondary | ICD-10-CM | POA: Diagnosis not present

## 2017-10-26 DIAGNOSIS — Z992 Dependence on renal dialysis: Secondary | ICD-10-CM | POA: Diagnosis not present

## 2017-10-26 DIAGNOSIS — E118 Type 2 diabetes mellitus with unspecified complications: Secondary | ICD-10-CM | POA: Diagnosis not present

## 2017-10-29 DIAGNOSIS — N186 End stage renal disease: Secondary | ICD-10-CM | POA: Diagnosis not present

## 2017-10-29 DIAGNOSIS — E118 Type 2 diabetes mellitus with unspecified complications: Secondary | ICD-10-CM | POA: Diagnosis not present

## 2017-10-29 DIAGNOSIS — D631 Anemia in chronic kidney disease: Secondary | ICD-10-CM | POA: Diagnosis not present

## 2017-10-29 DIAGNOSIS — N2581 Secondary hyperparathyroidism of renal origin: Secondary | ICD-10-CM | POA: Diagnosis not present

## 2017-10-30 DIAGNOSIS — Z452 Encounter for adjustment and management of vascular access device: Secondary | ICD-10-CM | POA: Diagnosis not present

## 2017-10-31 DIAGNOSIS — N186 End stage renal disease: Secondary | ICD-10-CM | POA: Diagnosis not present

## 2017-10-31 DIAGNOSIS — N2581 Secondary hyperparathyroidism of renal origin: Secondary | ICD-10-CM | POA: Diagnosis not present

## 2017-10-31 DIAGNOSIS — D631 Anemia in chronic kidney disease: Secondary | ICD-10-CM | POA: Diagnosis not present

## 2017-10-31 DIAGNOSIS — E118 Type 2 diabetes mellitus with unspecified complications: Secondary | ICD-10-CM | POA: Diagnosis not present

## 2017-11-02 DIAGNOSIS — D631 Anemia in chronic kidney disease: Secondary | ICD-10-CM | POA: Diagnosis not present

## 2017-11-02 DIAGNOSIS — E118 Type 2 diabetes mellitus with unspecified complications: Secondary | ICD-10-CM | POA: Diagnosis not present

## 2017-11-02 DIAGNOSIS — N186 End stage renal disease: Secondary | ICD-10-CM | POA: Diagnosis not present

## 2017-11-02 DIAGNOSIS — N2581 Secondary hyperparathyroidism of renal origin: Secondary | ICD-10-CM | POA: Diagnosis not present

## 2017-11-05 DIAGNOSIS — E118 Type 2 diabetes mellitus with unspecified complications: Secondary | ICD-10-CM | POA: Diagnosis not present

## 2017-11-05 DIAGNOSIS — N186 End stage renal disease: Secondary | ICD-10-CM | POA: Diagnosis not present

## 2017-11-05 DIAGNOSIS — D631 Anemia in chronic kidney disease: Secondary | ICD-10-CM | POA: Diagnosis not present

## 2017-11-05 DIAGNOSIS — N2581 Secondary hyperparathyroidism of renal origin: Secondary | ICD-10-CM | POA: Diagnosis not present

## 2017-11-07 DIAGNOSIS — N2581 Secondary hyperparathyroidism of renal origin: Secondary | ICD-10-CM | POA: Diagnosis not present

## 2017-11-07 DIAGNOSIS — D631 Anemia in chronic kidney disease: Secondary | ICD-10-CM | POA: Diagnosis not present

## 2017-11-07 DIAGNOSIS — E118 Type 2 diabetes mellitus with unspecified complications: Secondary | ICD-10-CM | POA: Diagnosis not present

## 2017-11-07 DIAGNOSIS — N186 End stage renal disease: Secondary | ICD-10-CM | POA: Diagnosis not present

## 2017-11-08 DIAGNOSIS — H40013 Open angle with borderline findings, low risk, bilateral: Secondary | ICD-10-CM | POA: Diagnosis not present

## 2017-11-08 DIAGNOSIS — H2513 Age-related nuclear cataract, bilateral: Secondary | ICD-10-CM | POA: Diagnosis not present

## 2017-11-08 LAB — HM DIABETES EYE EXAM

## 2017-11-09 DIAGNOSIS — N2581 Secondary hyperparathyroidism of renal origin: Secondary | ICD-10-CM | POA: Diagnosis not present

## 2017-11-09 DIAGNOSIS — D631 Anemia in chronic kidney disease: Secondary | ICD-10-CM | POA: Diagnosis not present

## 2017-11-09 DIAGNOSIS — E118 Type 2 diabetes mellitus with unspecified complications: Secondary | ICD-10-CM | POA: Diagnosis not present

## 2017-11-09 DIAGNOSIS — N186 End stage renal disease: Secondary | ICD-10-CM | POA: Diagnosis not present

## 2017-11-10 DIAGNOSIS — N186 End stage renal disease: Secondary | ICD-10-CM | POA: Diagnosis not present

## 2017-11-10 DIAGNOSIS — E118 Type 2 diabetes mellitus with unspecified complications: Secondary | ICD-10-CM | POA: Diagnosis not present

## 2017-11-10 DIAGNOSIS — D631 Anemia in chronic kidney disease: Secondary | ICD-10-CM | POA: Diagnosis not present

## 2017-11-10 DIAGNOSIS — N2581 Secondary hyperparathyroidism of renal origin: Secondary | ICD-10-CM | POA: Diagnosis not present

## 2017-11-12 DIAGNOSIS — D631 Anemia in chronic kidney disease: Secondary | ICD-10-CM | POA: Diagnosis not present

## 2017-11-12 DIAGNOSIS — E118 Type 2 diabetes mellitus with unspecified complications: Secondary | ICD-10-CM | POA: Diagnosis not present

## 2017-11-12 DIAGNOSIS — N2581 Secondary hyperparathyroidism of renal origin: Secondary | ICD-10-CM | POA: Diagnosis not present

## 2017-11-12 DIAGNOSIS — N186 End stage renal disease: Secondary | ICD-10-CM | POA: Diagnosis not present

## 2017-11-13 LAB — HM DIABETES EYE EXAM

## 2017-11-14 DIAGNOSIS — N2581 Secondary hyperparathyroidism of renal origin: Secondary | ICD-10-CM | POA: Diagnosis not present

## 2017-11-14 DIAGNOSIS — E118 Type 2 diabetes mellitus with unspecified complications: Secondary | ICD-10-CM | POA: Diagnosis not present

## 2017-11-14 DIAGNOSIS — N186 End stage renal disease: Secondary | ICD-10-CM | POA: Diagnosis not present

## 2017-11-14 DIAGNOSIS — D631 Anemia in chronic kidney disease: Secondary | ICD-10-CM | POA: Diagnosis not present

## 2017-11-16 DIAGNOSIS — N2581 Secondary hyperparathyroidism of renal origin: Secondary | ICD-10-CM | POA: Diagnosis not present

## 2017-11-16 DIAGNOSIS — D631 Anemia in chronic kidney disease: Secondary | ICD-10-CM | POA: Diagnosis not present

## 2017-11-16 DIAGNOSIS — E118 Type 2 diabetes mellitus with unspecified complications: Secondary | ICD-10-CM | POA: Diagnosis not present

## 2017-11-16 DIAGNOSIS — N186 End stage renal disease: Secondary | ICD-10-CM | POA: Diagnosis not present

## 2017-11-18 DIAGNOSIS — D631 Anemia in chronic kidney disease: Secondary | ICD-10-CM | POA: Diagnosis not present

## 2017-11-18 DIAGNOSIS — N186 End stage renal disease: Secondary | ICD-10-CM | POA: Diagnosis not present

## 2017-11-18 DIAGNOSIS — N2581 Secondary hyperparathyroidism of renal origin: Secondary | ICD-10-CM | POA: Diagnosis not present

## 2017-11-18 DIAGNOSIS — E118 Type 2 diabetes mellitus with unspecified complications: Secondary | ICD-10-CM | POA: Diagnosis not present

## 2017-11-21 DIAGNOSIS — N2581 Secondary hyperparathyroidism of renal origin: Secondary | ICD-10-CM | POA: Diagnosis not present

## 2017-11-21 DIAGNOSIS — D631 Anemia in chronic kidney disease: Secondary | ICD-10-CM | POA: Diagnosis not present

## 2017-11-21 DIAGNOSIS — N186 End stage renal disease: Secondary | ICD-10-CM | POA: Diagnosis not present

## 2017-11-21 DIAGNOSIS — E118 Type 2 diabetes mellitus with unspecified complications: Secondary | ICD-10-CM | POA: Diagnosis not present

## 2017-11-23 DIAGNOSIS — D631 Anemia in chronic kidney disease: Secondary | ICD-10-CM | POA: Diagnosis not present

## 2017-11-23 DIAGNOSIS — N186 End stage renal disease: Secondary | ICD-10-CM | POA: Diagnosis not present

## 2017-11-23 DIAGNOSIS — E118 Type 2 diabetes mellitus with unspecified complications: Secondary | ICD-10-CM | POA: Diagnosis not present

## 2017-11-23 DIAGNOSIS — N2581 Secondary hyperparathyroidism of renal origin: Secondary | ICD-10-CM | POA: Diagnosis not present

## 2017-11-25 DIAGNOSIS — N186 End stage renal disease: Secondary | ICD-10-CM | POA: Diagnosis not present

## 2017-11-25 DIAGNOSIS — N2581 Secondary hyperparathyroidism of renal origin: Secondary | ICD-10-CM | POA: Diagnosis not present

## 2017-11-25 DIAGNOSIS — E118 Type 2 diabetes mellitus with unspecified complications: Secondary | ICD-10-CM | POA: Diagnosis not present

## 2017-11-25 DIAGNOSIS — D631 Anemia in chronic kidney disease: Secondary | ICD-10-CM | POA: Diagnosis not present

## 2017-11-26 DIAGNOSIS — N186 End stage renal disease: Secondary | ICD-10-CM | POA: Diagnosis not present

## 2017-11-26 DIAGNOSIS — E1129 Type 2 diabetes mellitus with other diabetic kidney complication: Secondary | ICD-10-CM | POA: Diagnosis not present

## 2017-11-26 DIAGNOSIS — Z992 Dependence on renal dialysis: Secondary | ICD-10-CM | POA: Diagnosis not present

## 2017-11-28 DIAGNOSIS — D509 Iron deficiency anemia, unspecified: Secondary | ICD-10-CM | POA: Diagnosis not present

## 2017-11-28 DIAGNOSIS — N2581 Secondary hyperparathyroidism of renal origin: Secondary | ICD-10-CM | POA: Diagnosis not present

## 2017-11-28 DIAGNOSIS — D631 Anemia in chronic kidney disease: Secondary | ICD-10-CM | POA: Diagnosis not present

## 2017-11-28 DIAGNOSIS — N186 End stage renal disease: Secondary | ICD-10-CM | POA: Diagnosis not present

## 2017-11-28 DIAGNOSIS — E118 Type 2 diabetes mellitus with unspecified complications: Secondary | ICD-10-CM | POA: Diagnosis not present

## 2017-11-30 DIAGNOSIS — D631 Anemia in chronic kidney disease: Secondary | ICD-10-CM | POA: Diagnosis not present

## 2017-11-30 DIAGNOSIS — N186 End stage renal disease: Secondary | ICD-10-CM | POA: Diagnosis not present

## 2017-11-30 DIAGNOSIS — N2581 Secondary hyperparathyroidism of renal origin: Secondary | ICD-10-CM | POA: Diagnosis not present

## 2017-11-30 DIAGNOSIS — E118 Type 2 diabetes mellitus with unspecified complications: Secondary | ICD-10-CM | POA: Diagnosis not present

## 2017-11-30 DIAGNOSIS — D509 Iron deficiency anemia, unspecified: Secondary | ICD-10-CM | POA: Diagnosis not present

## 2017-12-03 DIAGNOSIS — N186 End stage renal disease: Secondary | ICD-10-CM | POA: Diagnosis not present

## 2017-12-03 DIAGNOSIS — D631 Anemia in chronic kidney disease: Secondary | ICD-10-CM | POA: Diagnosis not present

## 2017-12-03 DIAGNOSIS — N2581 Secondary hyperparathyroidism of renal origin: Secondary | ICD-10-CM | POA: Diagnosis not present

## 2017-12-03 DIAGNOSIS — D509 Iron deficiency anemia, unspecified: Secondary | ICD-10-CM | POA: Diagnosis not present

## 2017-12-03 DIAGNOSIS — E118 Type 2 diabetes mellitus with unspecified complications: Secondary | ICD-10-CM | POA: Diagnosis not present

## 2017-12-05 DIAGNOSIS — D509 Iron deficiency anemia, unspecified: Secondary | ICD-10-CM | POA: Diagnosis not present

## 2017-12-05 DIAGNOSIS — N186 End stage renal disease: Secondary | ICD-10-CM | POA: Diagnosis not present

## 2017-12-05 DIAGNOSIS — D631 Anemia in chronic kidney disease: Secondary | ICD-10-CM | POA: Diagnosis not present

## 2017-12-05 DIAGNOSIS — E118 Type 2 diabetes mellitus with unspecified complications: Secondary | ICD-10-CM | POA: Diagnosis not present

## 2017-12-05 DIAGNOSIS — N2581 Secondary hyperparathyroidism of renal origin: Secondary | ICD-10-CM | POA: Diagnosis not present

## 2017-12-07 DIAGNOSIS — D509 Iron deficiency anemia, unspecified: Secondary | ICD-10-CM | POA: Diagnosis not present

## 2017-12-07 DIAGNOSIS — N186 End stage renal disease: Secondary | ICD-10-CM | POA: Diagnosis not present

## 2017-12-07 DIAGNOSIS — N2581 Secondary hyperparathyroidism of renal origin: Secondary | ICD-10-CM | POA: Diagnosis not present

## 2017-12-07 DIAGNOSIS — D631 Anemia in chronic kidney disease: Secondary | ICD-10-CM | POA: Diagnosis not present

## 2017-12-07 DIAGNOSIS — E118 Type 2 diabetes mellitus with unspecified complications: Secondary | ICD-10-CM | POA: Diagnosis not present

## 2017-12-10 DIAGNOSIS — N186 End stage renal disease: Secondary | ICD-10-CM | POA: Diagnosis not present

## 2017-12-10 DIAGNOSIS — D631 Anemia in chronic kidney disease: Secondary | ICD-10-CM | POA: Diagnosis not present

## 2017-12-10 DIAGNOSIS — D509 Iron deficiency anemia, unspecified: Secondary | ICD-10-CM | POA: Diagnosis not present

## 2017-12-10 DIAGNOSIS — E118 Type 2 diabetes mellitus with unspecified complications: Secondary | ICD-10-CM | POA: Diagnosis not present

## 2017-12-10 DIAGNOSIS — N2581 Secondary hyperparathyroidism of renal origin: Secondary | ICD-10-CM | POA: Diagnosis not present

## 2017-12-12 DIAGNOSIS — D509 Iron deficiency anemia, unspecified: Secondary | ICD-10-CM | POA: Diagnosis not present

## 2017-12-12 DIAGNOSIS — N2581 Secondary hyperparathyroidism of renal origin: Secondary | ICD-10-CM | POA: Diagnosis not present

## 2017-12-12 DIAGNOSIS — D631 Anemia in chronic kidney disease: Secondary | ICD-10-CM | POA: Diagnosis not present

## 2017-12-12 DIAGNOSIS — E118 Type 2 diabetes mellitus with unspecified complications: Secondary | ICD-10-CM | POA: Diagnosis not present

## 2017-12-12 DIAGNOSIS — N186 End stage renal disease: Secondary | ICD-10-CM | POA: Diagnosis not present

## 2017-12-14 DIAGNOSIS — D631 Anemia in chronic kidney disease: Secondary | ICD-10-CM | POA: Diagnosis not present

## 2017-12-14 DIAGNOSIS — E118 Type 2 diabetes mellitus with unspecified complications: Secondary | ICD-10-CM | POA: Diagnosis not present

## 2017-12-14 DIAGNOSIS — N186 End stage renal disease: Secondary | ICD-10-CM | POA: Diagnosis not present

## 2017-12-14 DIAGNOSIS — N2581 Secondary hyperparathyroidism of renal origin: Secondary | ICD-10-CM | POA: Diagnosis not present

## 2017-12-14 DIAGNOSIS — D509 Iron deficiency anemia, unspecified: Secondary | ICD-10-CM | POA: Diagnosis not present

## 2017-12-17 DIAGNOSIS — D631 Anemia in chronic kidney disease: Secondary | ICD-10-CM | POA: Diagnosis not present

## 2017-12-17 DIAGNOSIS — N186 End stage renal disease: Secondary | ICD-10-CM | POA: Diagnosis not present

## 2017-12-17 DIAGNOSIS — E118 Type 2 diabetes mellitus with unspecified complications: Secondary | ICD-10-CM | POA: Diagnosis not present

## 2017-12-17 DIAGNOSIS — N2581 Secondary hyperparathyroidism of renal origin: Secondary | ICD-10-CM | POA: Diagnosis not present

## 2017-12-17 DIAGNOSIS — D509 Iron deficiency anemia, unspecified: Secondary | ICD-10-CM | POA: Diagnosis not present

## 2017-12-17 NOTE — Progress Notes (Deleted)
FOLLOW UP  Assessment and Plan:   Hypertension Well controlled with current medications  Monitor blood pressure at home; patient to call if consistently greater than 130/80 Continue DASH diet.   Reminder to go to the ER if any CP, SOB, nausea, dizziness, severe HA, changes vision/speech, left arm numbness and tingling and jaw pain.  CHF Disease process and medications discussed. Questions answered fully. Emphasized salt restriction, less than 2000mg  a day. Encouraged daily monitoring of the patient's weight, call office if 5 lb weight loss or gain in a day.  Encouraged regular exercise. If any increasing shortness of breath, swelling, or chest pressure go to ER immediately. Managed by dialysis -   ESRD Continue dialysis and follow up with nephrology Check BMP  Cholesterol Currently at LDL goal; continue statin. Triglycerides elevated - diet discussed.  Continue low cholesterol diet and exercise.  Check lipid panel.   Diabetes with diabetic nephropathy and with other diabetic kidney complication Continue medication: continue insulin Continue diet and exercise.  Perform daily foot/skin check, notify office of any concerning changes.  Check A1C  Obesity with co morbidities Long discussion about weight loss, diet, and exercise Recommended diet heavy in fruits and veggies and low in animal meats, cheeses, and dairy products, appropriate calorie intake Discussed ideal weight for height  Patient will work on *** Will follow up in 3 months  Vitamin D Def At goal at last visit; continue supplementation to maintain goal of 70-100 Defer Vit D level  Continue diet and meds as discussed. Further disposition pending results of labs. Discussed med's effects and SE's.   Over 30 minutes of exam, counseling, chart review, and critical decision making was performed.   Future Appointments  Date Time Provider [place]  12/18/2017  9:30 AM Liane Comber, NP [facility] None   03/26/2018 10:30 AM Unk Pinto, MD [facility] None  10/08/2018  2:00 PM Unk Pinto, MD [facility] None    ----------------------------------------------------------------------------------------------------------------------  HPI 61 y.o. female  presents for 3 month follow up on hypertension, cholesterol, diastolic CHF (T2 diabetes, morbid obesity and vitamin D deficiency. She has ESRD related to DM and is on dialysis.   She has a history of Diastolic CHF (01/3294 - LV EF 55-60%), followed by Dr. Stanford Breed, denies {BlankSingle:19196::"dyspnea on exertion","orthopnea","paroxysmal nocturnal dyspnea","edema"}. Positive for {CARDIAC SYMPTOMS:12860}. Wt Readings from Last 3 Encounters:  09/14/17 194 lb (88 kg)  09/13/17 198 lb 12.8 oz (90.2 kg)  09/11/17 200 lb (90.7 kg)   Her blood pressure {HAS HAS NOT:18834} been controlled at home, today their BP is    She {DOES_DOES JOA:41660} workout. She denies chest pain, shortness of breath, dizziness.   She is on cholesterol medication and denies myalgias. Her LDL cholesterol is at goal; triglycerides remain elevated. The cholesterol last visit was:   Lab Results  Component Value Date   CHOL 132 09/11/2017   HDL 49 (L) 09/11/2017   LDLCALC 38 06/07/2017   TRIG 218 (H) 09/11/2017   CHOLHDL 2.7 09/11/2017    She {Has/has not:18111} been working on diet and exercise for T2 diabetes, and denies {Symptoms; diabetes w/o none:19199}. Last A1C in the office was:  Lab Results  Component Value Date   HGBA1C 7.6 (H) 09/11/2017   Patient is on Vitamin D supplement but remains below goal:    Lab Results  Component Value Date   VD25OH 30 09/11/2017    She was recommended to increase to 10000 u daily at the last visit.     Current Medications:  Current Outpatient Medications on File Prior to Visit  Medication Sig  . acetaminophen (TYLENOL) 500 MG tablet Take 1,000 mg by mouth every 6 (six) hours as needed (for pain.).  Marland Kitchen aspirin EC 81 MG  tablet Take 81 mg by mouth daily.  Marland Kitchen azelastine (ASTELIN) 0.1 % nasal spray Place 2 sprays into both nostrils 2 (two) times daily. Use in each nostril as directed (Patient taking differently: Place 2 sprays into both nostrils 2 (two) times daily as needed (for allergies.). Use in each nostril as directed)  . busPIRone (BUSPAR) 10 MG tablet Take 1 tablet (10 mg total) by mouth 2 (two) times daily.  . carvedilol (COREG) 6.25 MG tablet Take 1 tablet (6.25 mg total) by mouth 2 (two) times daily.  . cinacalcet (SENSIPAR) 60 MG tablet Take 1 tablet (60 mg total) by mouth daily.  . insulin aspart protamine - aspart (NOVOLOG MIX 70/30 FLEXPEN) (70-30) 100 UNIT/ML FlexPen Inject 0.1 mLs (10 Units total) into the skin 2 (two) times daily. (Patient taking differently: Inject 10 Units into the skin 2 (two) times daily as needed (if blood sugar is above 180). )  . losartan (COZAAR) 50 MG tablet Take 1 tablet (50 mg total) by mouth daily.  Marland Kitchen oxyCODONE-acetaminophen (ROXICET) 5-325 MG tablet Take 1-2 tablets by mouth every 6 (six) hours as needed.  Marland Kitchen RENVELA 800 MG tablet Take 1,600-4,000 mg by mouth 3 (three) times daily. 4000 mg (5 caps) with meals and 1600 mg (2 caps) with snacks  . rosuvastatin (CRESTOR) 20 MG tablet Take 1 tablet (20 mg total) by mouth daily.  . sertraline (ZOLOFT) 25 MG tablet Take 1 tablet (25 mg total) by mouth daily.   No current facility-administered medications on file prior to visit.      Allergies:  Allergies  Allergen Reactions  . Minoxidil Other (See Comments)    Pericardial effusion  . Lipitor [Atorvastatin] Other (See Comments)    MYALGIAS > "pain in legs" Tolerates rosuvastatin  . Ace Inhibitors Other (See Comments)    REACTION: "not sure...think it made me drowsy all the time"     Medical History:  Past Medical History:  Diagnosis Date  . Anemia   . Chronic diastolic CHF (congestive heart failure) ([place])   . Chronic diastolic heart failure ([place])   . CKD (chronic  kidney disease) stage 4, GFR 15-29 ml/min ([facility])    dialysis M/W/F  . Constipation   . CVA (cerebral infarction) 1997   no residual deficit  . Diverticulitis   . DM (diabetes mellitus) ([place])    type 2  . Heart murmur   . History of cardiovascular stress test    a. Myoview Oct 2012 showed EF 49%, no ischemia, LVE  . HLD (hyperlipidemia)    takes Crestor daily  . Hypertension   . Pericardial effusion    chronic; felt to be poss related to minoxidil >> DC'd  . Pulmonary hypertension ([place]) 06/18/2015  . Respiratory failure, acute hypoxic, post-operative 07/08/2015   Requiring ECMO support  . S/P cardiac catheterization    a. R/L HC 06/18/15:  mLAD 30%; severe pulmo HTN with PA sat 43%, CI 1.86, prominent V waves indicative of MR; resting hypoxemia O2 sat 86% on RA  . S/P minimally invasive mitral valve repair 07/08/2015   Complex valvuloplasty including artificial Gore-tex neochord placement x10 and 26 mm Sorin Memo 3D Rechord ring annuloplasty via right mini thoracotomy approach  . Severe mitral regurgitation   . Shortness of breath dyspnea   .  Stroke ([place]) 1997   no residual effect  . Vitamin D deficiency   . Wears glasses    Family history- Reviewed and unchanged Social history- Reviewed and unchanged   Review of Systems:  Review of Systems  Constitutional: Negative for malaise/fatigue and weight loss.  HENT: Negative for hearing loss and tinnitus.   Eyes: Negative for blurred vision and double vision.  Respiratory: Negative for cough, shortness of breath and wheezing.   Cardiovascular: Negative for chest pain, palpitations, orthopnea, claudication and leg swelling.  Gastrointestinal: Negative for abdominal pain, blood in stool, constipation, diarrhea, heartburn, melena, nausea and vomiting.  Genitourinary: Negative.   Musculoskeletal: Negative for joint pain and myalgias.  Skin: Negative for rash.  Neurological: Negative for dizziness, tingling, sensory change, weakness and  headaches.  Endo/Heme/Allergies: Negative for polydipsia.  Psychiatric/Behavioral: Negative.   All other systems reviewed and are negative.   Physical Exam: There were no vitals taken for this visit. Wt Readings from Last 3 Encounters:  09/14/17 194 lb (88 kg)  09/13/17 198 lb 12.8 oz (90.2 kg)  09/11/17 200 lb (90.7 kg)   General Appearance: Well nourished, in no apparent distress. Eyes: PERRLA, EOMs, conjunctiva no swelling or erythema Sinuses: No Frontal/maxillary tenderness ENT/Mouth: Ext aud canals clear, TMs without erythema, bulging. No erythema, swelling, or exudate on post pharynx.  Tonsils not swollen or erythematous. Hearing normal.  Neck: Supple, thyroid normal.  Respiratory: Respiratory effort normal, BS equal bilaterally without rales, rhonchi, wheezing or stridor.  Cardio: RRR with no MRGs. Brisk peripheral pulses without edema.  Abdomen: Soft, + BS.  Non tender, no guarding, rebound, hernias, masses. Lymphatics: Non tender without lymphadenopathy.  Musculoskeletal: Full ROM, 5/5 strength, {PSY - GAIT AND STATION:22860} gait Skin: Warm, dry without rashes, lesions, ecchymosis.  Neuro: Cranial nerves intact. No cerebellar symptoms.  Psych: Awake and oriented X 3, normal affect, Insight and Judgment appropriate.    Izora Ribas, NP 8:06 AM [facility]

## 2017-12-18 ENCOUNTER — Ambulatory Visit (INDEPENDENT_AMBULATORY_CARE_PROVIDER_SITE_OTHER): Payer: Medicare Other | Admitting: Adult Health

## 2017-12-18 ENCOUNTER — Encounter: Payer: Self-pay | Admitting: Adult Health

## 2017-12-18 VITALS — BP 124/84 | HR 89 | Temp 97.5°F | Ht 60.0 in | Wt 196.0 lb

## 2017-12-18 DIAGNOSIS — Z79899 Other long term (current) drug therapy: Secondary | ICD-10-CM | POA: Diagnosis not present

## 2017-12-18 DIAGNOSIS — Z992 Dependence on renal dialysis: Secondary | ICD-10-CM

## 2017-12-18 DIAGNOSIS — I10 Essential (primary) hypertension: Secondary | ICD-10-CM | POA: Diagnosis not present

## 2017-12-18 DIAGNOSIS — Z7189 Other specified counseling: Secondary | ICD-10-CM | POA: Diagnosis not present

## 2017-12-18 DIAGNOSIS — Z0001 Encounter for general adult medical examination with abnormal findings: Secondary | ICD-10-CM

## 2017-12-18 DIAGNOSIS — R6889 Other general symptoms and signs: Secondary | ICD-10-CM

## 2017-12-18 DIAGNOSIS — E559 Vitamin D deficiency, unspecified: Secondary | ICD-10-CM | POA: Diagnosis not present

## 2017-12-18 DIAGNOSIS — Z794 Long term (current) use of insulin: Secondary | ICD-10-CM | POA: Diagnosis not present

## 2017-12-18 DIAGNOSIS — I429 Cardiomyopathy, unspecified: Secondary | ICD-10-CM

## 2017-12-18 DIAGNOSIS — D631 Anemia in chronic kidney disease: Secondary | ICD-10-CM | POA: Diagnosis not present

## 2017-12-18 DIAGNOSIS — K0530 Chronic periodontitis, unspecified: Secondary | ICD-10-CM

## 2017-12-18 DIAGNOSIS — E782 Mixed hyperlipidemia: Secondary | ICD-10-CM

## 2017-12-18 DIAGNOSIS — IMO0001 Reserved for inherently not codable concepts without codable children: Secondary | ICD-10-CM

## 2017-12-18 DIAGNOSIS — I272 Pulmonary hypertension, unspecified: Secondary | ICD-10-CM

## 2017-12-18 DIAGNOSIS — N186 End stage renal disease: Secondary | ICD-10-CM | POA: Diagnosis not present

## 2017-12-18 DIAGNOSIS — Z0000 Encounter for general adult medical examination without abnormal findings: Secondary | ICD-10-CM

## 2017-12-18 DIAGNOSIS — Z136 Encounter for screening for cardiovascular disorders: Secondary | ICD-10-CM | POA: Diagnosis not present

## 2017-12-18 DIAGNOSIS — I480 Paroxysmal atrial fibrillation: Secondary | ICD-10-CM

## 2017-12-18 DIAGNOSIS — E1129 Type 2 diabetes mellitus with other diabetic kidney complication: Secondary | ICD-10-CM

## 2017-12-18 DIAGNOSIS — Z9889 Other specified postprocedural states: Secondary | ICD-10-CM

## 2017-12-18 DIAGNOSIS — I5032 Chronic diastolic (congestive) heart failure: Secondary | ICD-10-CM

## 2017-12-18 NOTE — Patient Instructions (Addendum)
[facility]  7 a.m.-6:30 p.m., Monday 7 a.m.-5 p.m., Tuesday-Friday Schedule an appointment by calling [phone].  [facility] Schedule an appointment by calling [phone].    Please have the advanced medical directives and living will completed and notarized - bring by the office at your convenience so we can scan into computer and have available.    We will try to taper off of buspirone/buspar and sertraline as your symptoms have been well controlled.   Start by cutting down to 1/2 tab buspar twice a day x 2 weeks, then 1/2 tab once daily for 1 week, then stop taking. See how you do for a few weeks.   If doing well, can cut sertraline in 1/2 for two weeks, then stop taking.   Should you start having mood problems, anxiety, depression, you can contact us at any time to restart these medications.

## 2017-12-18 NOTE — Progress Notes (Signed)
WELCOME TO MEDICARE VISIT AND FOLLOW UP  Assessment:   Jody Nguyen was seen today for medicare wellness and follow-up.  Diagnoses and all orders for this visit:  Medicare annual wellness visit, initial  Essential hypertension At goal; continue medication Monitor blood pressure at home; call if consistently over 130/80 Continue DASH diet.   Reminder to go to the ER if any CP, SOB, nausea, dizziness, severe HA, changes vision/speech, left arm numbness and tingling and jaw pain -     EKG 12-Lead -     [place], RETROPERITNL ABD,  LTD  Chronic diastolic heart failure (HCC)       Followed by cardiology; edema resolved since dialysis  Insulin dependent diabetes with renal manifestation (HCC) Insulin 70/30 PRN hyperglycemia 180<; has not needed in past 6 months Reminded eye Exam yearly (has done, report requested) and Dental Exam every 6 months. Dietary recommendations reviewed Physical Activity recommendations reviewed -     Hemoglobin A1c  ESRD on dialysis [facility]) Doing well with dialysis after extensive fistula access issues; MWF; followed by Dr. Joelyn Oms -     BASIC METABOLIC PANEL WITH GFR  Anemia in chronic kidney disease, on chronic dialysis ([place]) Followed by nephrology -     CBC with Differential/Platelet  Hyperlipidemia, mixed Continue medications: crestor 20 mg daily Continue low cholesterol diet and exercise.  -     Lipid panel -     TSH  Vitamin D deficiency Followed by nephrology; continue supplementation -     VITAMIN D 25 Hydroxy (Vit-D Deficiency, Fractures)  Medication management -     CBC with Differential/Platelet -     BASIC METABOLIC PANEL WITH GFR -     Hepatic function panel  Morbid obesity (BMI 36.05)  Long discussion about weight loss, diet, and exercise Recommended diet heavy in fruits and veggies and low in animal meats, cheeses, and dairy products, appropriate calorie intake Discussed appropriate weight for height Follow up 3 months  Pulmonary  hypertension (HCC) Monitoring, symptoms improved from previous, stable Pulmonology referral as indicated  Cardiomyopathy, unspecified type ([place]) Followed by cardiology  Paroxysmal atrial fibrillation [name] No longer on anticoagulation; continue follow up with cardiology  Chronic periodontitis Reminded to schedule follow up with dental  S/P minimally invasive mitral valve repair Followed by cardiology  Counseling for advanced care planning and goals of care Discussed at length; wishes discussed, reviewed Advanced care plan and living will paperwork, provided to be complete/notarized.   Depression She has been on depression medications for over 2 years without diagnosis; patient believes were initiated while hospitalized for cardiac conditions in 2016. She is doing well and agreeable to try taper off. Instructions discussed and provided on AVS. She may contact the office at any point to discuss recurring depressive symptoms to restart medications.   Over 40 minutes of exam, counseling, chart review and critical decision making was performed Future Appointments  Date Time Provider [place]  03/26/2018 10:30 AM Unk Pinto, MD [facility] None  10/08/2018  2:00 PM Unk Pinto, MD [facility] None     Plan:   During the course of the visit the patient was educated and counseled about appropriate screening and preventive services including:    Pneumococcal vaccine   Prevnar 13  Influenza vaccine  Td vaccine  Screening electrocardiogram  Bone densitometry screening  Colorectal cancer screening  Diabetes screening  Glaucoma screening  Nutrition counseling   Advanced directives: requested   Subjective:  Jody Nguyen is a 62 y.o. female who presents  for Medicare Annual Wellness Visit and 3 month follow up. She is in ESRD on dialysis, goes MWF, followed by Dr. Joelyn Oms. She reports she is doing very well recently. She is followed by cardiology with  extensive hx including cath, bypass and mitral valve repair in 2016. Reports she is doing well in this past year, denies CP, dyspnea, palpitations, orthopnea, LE edema.    Her blood pressure has been controlled at home, today their BP is BP: 124/84 She does workout. She denies chest pain, shortness of breath, dizziness.  She is on cholesterol medication and denies myalgias. Her cholesterol is at goal. The cholesterol last visit was:   Lab Results  Component Value Date   CHOL 132 09/11/2017   HDL 49 (L) 09/11/2017   LDLCALC 38 06/07/2017   TRIG 218 (H) 09/11/2017   CHOLHDL 2.7 09/11/2017    She has been working on diet and exercise for T2 diabetes, on daily ASA, not on ACE/ARB secondary to ESRD on dialysis, and denies foot ulcerations, hyperglycemia, hypoglycemia , increased appetite, nausea, paresthesia of the feet, polydipsia, polyuria, visual disturbances and vomiting. She takes insulin only as needed for glucose readings above 180. Has not needed in past 6 months. Last A1C in the office was:  Lab Results  Component Value Date   HGBA1C 7.6 (H) 09/11/2017   Last GFR: Lab Results  Component Value Date   GFRAA 8 (L) 09/11/2017   Patient is on Vitamin D supplement.   Lab Results  Component Value Date   VD25OH 30 09/11/2017      Medication Review: Current Outpatient Medications on File Prior to Visit  Medication Sig Dispense Refill  . acetaminophen (TYLENOL) 500 MG tablet Take 1,000 mg by mouth every 6 (six) hours as needed (for pain.).    Marland Kitchen aspirin EC 81 MG tablet Take 81 mg by mouth daily.    Marland Kitchen azelastine (ASTELIN) 0.1 % nasal spray Place 2 sprays into both nostrils 2 (two) times daily. Use in each nostril as directed (Patient taking differently: Place 2 sprays into both nostrils 2 (two) times daily as needed (for allergies.). Use in each nostril as directed) 30 mL 2  . busPIRone (BUSPAR) 10 MG tablet Take 1 tablet (10 mg total) by mouth 2 (two) times daily. 180 tablet 1  .  carvedilol (COREG) 6.25 MG tablet Take 1 tablet (6.25 mg total) by mouth 2 (two) times daily. 180 tablet 1  . cinacalcet (SENSIPAR) 60 MG tablet Take 1 tablet (60 mg total) by mouth daily. 90 tablet 1  . insulin aspart protamine - aspart (NOVOLOG MIX 70/30 FLEXPEN) (70-30) 100 UNIT/ML FlexPen Inject 0.1 mLs (10 Units total) into the skin 2 (two) times daily. (Patient taking differently: Inject 10 Units into the skin 2 (two) times daily as needed (if blood sugar is above 180). ) 15 mL 3  . losartan (COZAAR) 50 MG tablet Take 1 tablet (50 mg total) by mouth daily. 90 tablet 1  . oxyCODONE-acetaminophen (ROXICET) 5-325 MG tablet Take 1-2 tablets by mouth every 6 (six) hours as needed. 20 tablet 0  . RENVELA 800 MG tablet Take 1,600-4,000 mg by mouth 3 (three) times daily. 4000 mg (5 caps) with meals and 1600 mg (2 caps) with snacks  5  . rosuvastatin (CRESTOR) 20 MG tablet Take 1 tablet (20 mg total) by mouth daily. 90 tablet 1  . sertraline (ZOLOFT) 25 MG tablet Take 1 tablet (25 mg total) by mouth daily. 90 tablet 1   No  current facility-administered medications on file prior to visit.     Allergies  Allergen Reactions  . Minoxidil Other (See Comments)    Pericardial effusion  . Lipitor [Atorvastatin] Other (See Comments)    MYALGIAS > "pain in legs" Tolerates rosuvastatin  . Ace Inhibitors Other (See Comments)    REACTION: "not sure...think it made me drowsy all the time"    Current Problems (verified) Patient Active Problem List   Diagnosis Date Noted  . ESRD on dialysis ([place]) 10/09/2015  . Cardiomyopathy ([place]) 09/10/2015  . Atrial fibrillation ([place]) 09/10/2015  . S/P minimally invasive mitral valve repair 07/08/2015  . Chronic periodontitis 06/30/2015  . Chronic diastolic heart failure ([place])   . Pulmonary hypertension ([place]) 06/18/2015  . Morbid obesity (BMI 36.05)  03/29/2015  . Insulin dependent diabetes with renal manifestation ([place]) 11/22/2014  . Vitamin D deficiency  03/25/2014  . Medication management 03/25/2014  . Anemia in chronic kidney disease   . Hypertension 08/17/2011  . Hyperlipidemia, mixed 08/17/2011    Screening Tests Immunization History  Administered Date(s) Administered  . Influenza Split 08/20/2012, 08/15/2017  . PPD Test 03/25/2014, 03/29/2015  . Pneumococcal Polysaccharide-23 09/11/2017  . Pneumococcal-Unspecified 07/28/2008  . Tdap 12/28/2008    Preventative care: Last colonoscopy: 2013  Last mammogram: 06/2016 - will call to schedule Last pap smear/pelvic exam: 2017   DEXA: 2010 normal - not a candidate for bisphophonates - defer  Prior vaccinations: TD or Tdap: 2010  Influenza: 2018  Pneumococcal: 2018 Prevnar13: 2009 Shingles/Zostavax: zostavax, can't remember when, less than 4 years ago  Names of Other Physician/Practitioners you currently use: 1. [facility] here for primary care 2. Dr. Katy Fitch, eye doctor, last visit 12/?/2018 - report requested 3. , dentist, last visit 2016 - needs to schedule  Patient Care Team: Unk Pinto, MD as PCP - General (Internal Medicine) Rexene Agent, MD as Attending Physician (Nephrology)  SURGICAL HISTORY She  has a past surgical history that includes Cesarean section; TEE without cardioversion (N/A, 06/08/2015); Cardiac catheterization; Cardiac catheterization (N/A, 06/18/2015); Colonoscopy; Multiple extractions with alveoloplasty (N/A, 06/30/2015); Mitral valve repair (Right, 07/08/2015); TEE without cardioversion (N/A, 07/08/2015); Cannulation for cardiopulmonary bypass (N/A, 07/08/2015); AV fistula placement (Left, 10/22/2015); Fistula superficialization (Left, 12/28/2015); Cardiac catheterization (Left, 03/28/2016); Ligation of arteriovenous  fistula (Left, 04/28/2017); Insertion of dialysis catheter (Right, 04/28/2017); AV fistula placement (Right, 05/22/2017); Exchange of a dialysis catheter (N/A, 06/28/2017); A/V Fistulagram (Right, 08/16/2017); and  Fistula superficialization (Right, 08/21/2017). FAMILY HISTORY Her family history includes Breast cancer in her sister; Cancer in her sister; Heart attack in her brother; Heart disease in her brother; Stroke in her mother. SOCIAL HISTORY She  reports that  has never smoked. she has never used smokeless tobacco. She reports that she does not drink alcohol or use drugs.   MEDICARE WELLNESS OBJECTIVES: Physical activity: Current Exercise Habits: Home exercise routine, Type of exercise: walking, Time (Minutes): 20, Frequency (Times/Week): 7, Weekly Exercise (Minutes/Week): 140, Intensity: Mild Cardiac risk factors: Cardiac Risk Factors include: diabetes mellitus;dyslipidemia;hypertension;obesity (BMI >30kg/m2);microalbuminuria Depression/mood screen:   Depression screen [facility] 2/9 12/18/2017  Decreased Interest 0  Down, Depressed, Hopeless 0  PHQ - 2 Score 0  Some recent data might be hidden    ADLs:  In your present state of health, do you have any difficulty performing the following activities: 12/18/2017 09/11/2017  Hearing? N N  Vision? N N  Difficulty concentrating or making decisions? N N  Walking or climbing stairs? N N  Dressing or  bathing? N N  Doing errands, shopping? N N  Some recent data might be hidden     Cognitive Testing  Alert? Yes  Normal Appearance?Yes  Oriented to person? Yes  Place? Yes   Time? Yes  Recall of three objects?  Yes  Can perform simple calculations? Yes  Displays appropriate judgment?Yes  Can read the correct time from a watch face?Yes  EOL planning: Does Patient Have a Medical Advance Directive?: No Would patient like information on creating a medical advance directive?: Yes (MAU/Ambulatory/Procedural Areas - Information given)  Review of Systems  Constitutional: Negative for malaise/fatigue and weight loss.  HENT: Negative for hearing loss and tinnitus.   Eyes: Negative for blurred vision and double vision.  Respiratory: Negative for cough,  shortness of breath and wheezing.   Cardiovascular: Negative for chest pain, palpitations, orthopnea, claudication and leg swelling.  Gastrointestinal: Negative for abdominal pain, blood in stool, constipation, diarrhea, heartburn, melena, nausea and vomiting.  Genitourinary: Negative.   Musculoskeletal: Negative for joint pain and myalgias.  Skin: Negative for rash.  Neurological: Negative for dizziness, tingling, sensory change, weakness and headaches.  Endo/Heme/Allergies: Negative for polydipsia.  Psychiatric/Behavioral: Negative.   All other systems reviewed and are negative.    Objective:     Today's Vitals   12/18/17 0934  BP: 124/84  Pulse: 89  Temp: (!) 97.5 F (36.4 C)  SpO2: 92%  Weight: 196 lb (88.9 kg)  Height: 5' (1.524 m)   Body mass index is 38.28 kg/m.  General appearance: alert, no distress, WD/WN, female HEENT: normocephalic, sclerae anicteric, TMs pearly, nares patent, no discharge or erythema, pharynx normal Oral cavity: MMM, no lesions Neck: supple, no lymphadenopathy, no thyromegaly, no masses Heart: RRR, normal S1, S2, no murmurs. AV fistula to RUE with scarring.  Lungs: CTA bilaterally, no wheezes, rhonchi, or rales Abdomen: +bs, soft, non tender, non distended, no masses, no hepatomegaly, no splenomegaly Musculoskeletal: nontender, no swelling, no obvious deformity Extremities: no edema, no cyanosis, no clubbing Pulses: 2+ symmetric, upper and lower extremities, normal cap refill Neurological: alert, oriented x 3, CN2-12 intact, strength normal upper extremities and lower extremities, sensation normal throughout, DTRs 2+ throughout, no cerebellar signs, gait normal Psychiatric: normal affect, behavior normal, pleasant   EKG: Bradycardia with 1st degree AV block Ab [place] AAA: WNL  Medicare Attestation I have personally reviewed: The patient's medical and social history Their use of alcohol, tobacco or illicit drugs Their current medications and  supplements The patient's functional ability including ADLs,fall risks, home safety risks, cognitive, and hearing and visual impairment Diet and physical activities Evidence for depression or mood disorders  The patient's weight, height, BMI, and visual acuity have been recorded in the chart.  I have made referrals, counseling, and provided education to the patient based on review of the above and I have provided the patient with a written personalized care plan for preventive services.     Izora Ribas, NP   12/18/2017

## 2017-12-19 DIAGNOSIS — D631 Anemia in chronic kidney disease: Secondary | ICD-10-CM | POA: Diagnosis not present

## 2017-12-19 DIAGNOSIS — D509 Iron deficiency anemia, unspecified: Secondary | ICD-10-CM | POA: Diagnosis not present

## 2017-12-19 DIAGNOSIS — N186 End stage renal disease: Secondary | ICD-10-CM | POA: Diagnosis not present

## 2017-12-19 DIAGNOSIS — E118 Type 2 diabetes mellitus with unspecified complications: Secondary | ICD-10-CM | POA: Diagnosis not present

## 2017-12-19 DIAGNOSIS — N2581 Secondary hyperparathyroidism of renal origin: Secondary | ICD-10-CM | POA: Diagnosis not present

## 2017-12-19 LAB — CBC WITH DIFFERENTIAL/PLATELET
Basophils Absolute: 30 cells/uL (ref 0–200)
Basophils Relative: 0.6 %
Eosinophils Absolute: 240 cells/uL (ref 15–500)
Eosinophils Relative: 4.8 %
HCT: 36.3 % (ref 35.0–45.0)
Hemoglobin: 11.6 g/dL — ABNORMAL LOW (ref 11.7–15.5)
Lymphs Abs: 1615 cells/uL (ref 850–3900)
MCH: 27.8 pg (ref 27.0–33.0)
MCHC: 32 g/dL (ref 32.0–36.0)
MCV: 86.8 fL (ref 80.0–100.0)
MPV: 11.8 fL (ref 7.5–12.5)
Monocytes Relative: 10.1 %
Neutro Abs: 2610 cells/uL (ref 1500–7800)
Neutrophils Relative %: 52.2 %
Platelets: 205 10*3/uL (ref 140–400)
RBC: 4.18 10*6/uL (ref 3.80–5.10)
RDW: 17 % — ABNORMAL HIGH (ref 11.0–15.0)
Total Lymphocyte: 32.3 %
WBC mixed population: 505 cells/uL (ref 200–950)
WBC: 5 10*3/uL (ref 3.8–10.8)

## 2017-12-19 LAB — BASIC METABOLIC PANEL WITH GFR
BUN/Creatinine Ratio: 6 (calc) (ref 6–22)
BUN: 43 mg/dL — ABNORMAL HIGH (ref 7–25)
CO2: 34 mmol/L — ABNORMAL HIGH (ref 20–32)
Calcium: 9.4 mg/dL (ref 8.6–10.4)
Chloride: 94 mmol/L — ABNORMAL LOW (ref 98–110)
Creat: 6.8 mg/dL — ABNORMAL HIGH (ref 0.50–0.99)
GFR, Est African American: 7 mL/min/{1.73_m2} — ABNORMAL LOW (ref >=60)
GFR, Est Non African American: 6 mL/min/{1.73_m2} — ABNORMAL LOW (ref >=60)
Glucose, Bld: 121 mg/dL — ABNORMAL HIGH (ref 65–99)
Potassium: 4.3 mmol/L (ref 3.5–5.3)
Sodium: 138 mmol/L (ref 135–146)

## 2017-12-19 LAB — VITAMIN D 25 HYDROXY (VIT D DEFICIENCY, FRACTURES): Vit D, 25-Hydroxy: 35 ng/mL (ref 30–100)

## 2017-12-19 LAB — LIPID PANEL
Cholesterol: 139 mg/dL (ref <200)
HDL: 51 mg/dL (ref >50)
LDL Cholesterol (Calc): 62 mg/dL (calc)
Non-HDL Cholesterol (Calc): 88 mg/dL (calc) (ref <130)
Total CHOL/HDL Ratio: 2.7 (calc) (ref <5.0)
Triglycerides: 193 mg/dL — ABNORMAL HIGH (ref <150)

## 2017-12-19 LAB — HEPATIC FUNCTION PANEL
AG Ratio: 1.3 (calc) (ref 1.0–2.5)
ALT: 12 U/L (ref 6–29)
AST: 12 U/L (ref 10–35)
Albumin: 4.6 g/dL (ref 3.6–5.1)
Alkaline phosphatase (APISO): 103 U/L (ref 33–130)
Bilirubin, Direct: 0.1 mg/dL (ref 0.0–0.2)
Globulin: 3.6 g/dL (calc) (ref 1.9–3.7)
Indirect Bilirubin: 0.3 mg/dL (calc) (ref 0.2–1.2)
Total Bilirubin: 0.4 mg/dL (ref 0.2–1.2)
Total Protein: 8.2 g/dL — ABNORMAL HIGH (ref 6.1–8.1)

## 2017-12-19 LAB — HEMOGLOBIN A1C
Hgb A1c MFr Bld: 6.6 % of total Hgb — ABNORMAL HIGH (ref <5.7)
Mean Plasma Glucose: 143 (calc)
eAG (mmol/L): 7.9 (calc)

## 2017-12-19 LAB — TSH: TSH: 2.51 mIU/L (ref 0.40–4.50)

## 2017-12-20 ENCOUNTER — Encounter: Payer: Self-pay | Admitting: *Deleted

## 2017-12-20 DIAGNOSIS — H2513 Age-related nuclear cataract, bilateral: Secondary | ICD-10-CM | POA: Diagnosis not present

## 2017-12-20 DIAGNOSIS — H40013 Open angle with borderline findings, low risk, bilateral: Secondary | ICD-10-CM | POA: Diagnosis not present

## 2017-12-21 DIAGNOSIS — D631 Anemia in chronic kidney disease: Secondary | ICD-10-CM | POA: Diagnosis not present

## 2017-12-21 DIAGNOSIS — D509 Iron deficiency anemia, unspecified: Secondary | ICD-10-CM | POA: Diagnosis not present

## 2017-12-21 DIAGNOSIS — N2581 Secondary hyperparathyroidism of renal origin: Secondary | ICD-10-CM | POA: Diagnosis not present

## 2017-12-21 DIAGNOSIS — E118 Type 2 diabetes mellitus with unspecified complications: Secondary | ICD-10-CM | POA: Diagnosis not present

## 2017-12-21 DIAGNOSIS — N186 End stage renal disease: Secondary | ICD-10-CM | POA: Diagnosis not present

## 2017-12-24 DIAGNOSIS — N186 End stage renal disease: Secondary | ICD-10-CM | POA: Diagnosis not present

## 2017-12-24 DIAGNOSIS — E118 Type 2 diabetes mellitus with unspecified complications: Secondary | ICD-10-CM | POA: Diagnosis not present

## 2017-12-24 DIAGNOSIS — N2581 Secondary hyperparathyroidism of renal origin: Secondary | ICD-10-CM | POA: Diagnosis not present

## 2017-12-24 DIAGNOSIS — D509 Iron deficiency anemia, unspecified: Secondary | ICD-10-CM | POA: Diagnosis not present

## 2017-12-24 DIAGNOSIS — D631 Anemia in chronic kidney disease: Secondary | ICD-10-CM | POA: Diagnosis not present

## 2017-12-26 DIAGNOSIS — N186 End stage renal disease: Secondary | ICD-10-CM | POA: Diagnosis not present

## 2017-12-26 DIAGNOSIS — E118 Type 2 diabetes mellitus with unspecified complications: Secondary | ICD-10-CM | POA: Diagnosis not present

## 2017-12-26 DIAGNOSIS — D509 Iron deficiency anemia, unspecified: Secondary | ICD-10-CM | POA: Diagnosis not present

## 2017-12-26 DIAGNOSIS — D631 Anemia in chronic kidney disease: Secondary | ICD-10-CM | POA: Diagnosis not present

## 2017-12-26 DIAGNOSIS — N2581 Secondary hyperparathyroidism of renal origin: Secondary | ICD-10-CM | POA: Diagnosis not present

## 2017-12-27 DIAGNOSIS — Z992 Dependence on renal dialysis: Secondary | ICD-10-CM | POA: Diagnosis not present

## 2017-12-27 DIAGNOSIS — E1129 Type 2 diabetes mellitus with other diabetic kidney complication: Secondary | ICD-10-CM | POA: Diagnosis not present

## 2017-12-27 DIAGNOSIS — N186 End stage renal disease: Secondary | ICD-10-CM | POA: Diagnosis not present

## 2017-12-28 ENCOUNTER — Other Ambulatory Visit: Payer: Self-pay | Admitting: *Deleted

## 2017-12-28 DIAGNOSIS — N186 End stage renal disease: Secondary | ICD-10-CM | POA: Diagnosis not present

## 2017-12-28 DIAGNOSIS — D631 Anemia in chronic kidney disease: Secondary | ICD-10-CM | POA: Diagnosis not present

## 2017-12-28 DIAGNOSIS — E118 Type 2 diabetes mellitus with unspecified complications: Secondary | ICD-10-CM | POA: Diagnosis not present

## 2017-12-28 DIAGNOSIS — N2581 Secondary hyperparathyroidism of renal origin: Secondary | ICD-10-CM | POA: Diagnosis not present

## 2017-12-28 DIAGNOSIS — E1129 Type 2 diabetes mellitus with other diabetic kidney complication: Secondary | ICD-10-CM | POA: Diagnosis not present

## 2017-12-28 DIAGNOSIS — Z992 Dependence on renal dialysis: Secondary | ICD-10-CM | POA: Diagnosis not present

## 2017-12-28 DIAGNOSIS — D509 Iron deficiency anemia, unspecified: Secondary | ICD-10-CM | POA: Diagnosis not present

## 2017-12-28 MED ORDER — CARVEDILOL 6.25 MG PO TABS: 6.2500 mg | ORAL_TABLET | Freq: Two times a day (BID) | ORAL | 1 refills | Status: DC

## 2017-12-28 MED ORDER — BUSPIRONE HCL 10 MG PO TABS: 10.0000 mg | ORAL_TABLET | Freq: Two times a day (BID) | ORAL | 1 refills | Status: DC

## 2017-12-31 DIAGNOSIS — N2581 Secondary hyperparathyroidism of renal origin: Secondary | ICD-10-CM | POA: Diagnosis not present

## 2017-12-31 DIAGNOSIS — N186 End stage renal disease: Secondary | ICD-10-CM | POA: Diagnosis not present

## 2017-12-31 DIAGNOSIS — D509 Iron deficiency anemia, unspecified: Secondary | ICD-10-CM | POA: Diagnosis not present

## 2017-12-31 DIAGNOSIS — E118 Type 2 diabetes mellitus with unspecified complications: Secondary | ICD-10-CM | POA: Diagnosis not present

## 2017-12-31 DIAGNOSIS — D631 Anemia in chronic kidney disease: Secondary | ICD-10-CM | POA: Diagnosis not present

## 2018-01-02 DIAGNOSIS — D509 Iron deficiency anemia, unspecified: Secondary | ICD-10-CM | POA: Diagnosis not present

## 2018-01-02 DIAGNOSIS — D631 Anemia in chronic kidney disease: Secondary | ICD-10-CM | POA: Diagnosis not present

## 2018-01-02 DIAGNOSIS — N2581 Secondary hyperparathyroidism of renal origin: Secondary | ICD-10-CM | POA: Diagnosis not present

## 2018-01-02 DIAGNOSIS — N186 End stage renal disease: Secondary | ICD-10-CM | POA: Diagnosis not present

## 2018-01-02 DIAGNOSIS — E118 Type 2 diabetes mellitus with unspecified complications: Secondary | ICD-10-CM | POA: Diagnosis not present

## 2018-01-04 DIAGNOSIS — D509 Iron deficiency anemia, unspecified: Secondary | ICD-10-CM | POA: Diagnosis not present

## 2018-01-04 DIAGNOSIS — N2581 Secondary hyperparathyroidism of renal origin: Secondary | ICD-10-CM | POA: Diagnosis not present

## 2018-01-04 DIAGNOSIS — E118 Type 2 diabetes mellitus with unspecified complications: Secondary | ICD-10-CM | POA: Diagnosis not present

## 2018-01-04 DIAGNOSIS — N186 End stage renal disease: Secondary | ICD-10-CM | POA: Diagnosis not present

## 2018-01-04 DIAGNOSIS — D631 Anemia in chronic kidney disease: Secondary | ICD-10-CM | POA: Diagnosis not present

## 2018-01-07 DIAGNOSIS — N186 End stage renal disease: Secondary | ICD-10-CM | POA: Diagnosis not present

## 2018-01-07 DIAGNOSIS — D631 Anemia in chronic kidney disease: Secondary | ICD-10-CM | POA: Diagnosis not present

## 2018-01-07 DIAGNOSIS — N2581 Secondary hyperparathyroidism of renal origin: Secondary | ICD-10-CM | POA: Diagnosis not present

## 2018-01-07 DIAGNOSIS — D509 Iron deficiency anemia, unspecified: Secondary | ICD-10-CM | POA: Diagnosis not present

## 2018-01-07 DIAGNOSIS — E118 Type 2 diabetes mellitus with unspecified complications: Secondary | ICD-10-CM | POA: Diagnosis not present

## 2018-01-09 DIAGNOSIS — D631 Anemia in chronic kidney disease: Secondary | ICD-10-CM | POA: Diagnosis not present

## 2018-01-09 DIAGNOSIS — N186 End stage renal disease: Secondary | ICD-10-CM | POA: Diagnosis not present

## 2018-01-09 DIAGNOSIS — N2581 Secondary hyperparathyroidism of renal origin: Secondary | ICD-10-CM | POA: Diagnosis not present

## 2018-01-09 DIAGNOSIS — E118 Type 2 diabetes mellitus with unspecified complications: Secondary | ICD-10-CM | POA: Diagnosis not present

## 2018-01-09 DIAGNOSIS — D509 Iron deficiency anemia, unspecified: Secondary | ICD-10-CM | POA: Diagnosis not present

## 2018-01-11 DIAGNOSIS — D509 Iron deficiency anemia, unspecified: Secondary | ICD-10-CM | POA: Diagnosis not present

## 2018-01-11 DIAGNOSIS — N186 End stage renal disease: Secondary | ICD-10-CM | POA: Diagnosis not present

## 2018-01-11 DIAGNOSIS — N2581 Secondary hyperparathyroidism of renal origin: Secondary | ICD-10-CM | POA: Diagnosis not present

## 2018-01-11 DIAGNOSIS — E118 Type 2 diabetes mellitus with unspecified complications: Secondary | ICD-10-CM | POA: Diagnosis not present

## 2018-01-11 DIAGNOSIS — D631 Anemia in chronic kidney disease: Secondary | ICD-10-CM | POA: Diagnosis not present

## 2018-01-14 DIAGNOSIS — E118 Type 2 diabetes mellitus with unspecified complications: Secondary | ICD-10-CM | POA: Diagnosis not present

## 2018-01-14 DIAGNOSIS — N186 End stage renal disease: Secondary | ICD-10-CM | POA: Diagnosis not present

## 2018-01-14 DIAGNOSIS — D631 Anemia in chronic kidney disease: Secondary | ICD-10-CM | POA: Diagnosis not present

## 2018-01-14 DIAGNOSIS — D509 Iron deficiency anemia, unspecified: Secondary | ICD-10-CM | POA: Diagnosis not present

## 2018-01-14 DIAGNOSIS — N2581 Secondary hyperparathyroidism of renal origin: Secondary | ICD-10-CM | POA: Diagnosis not present

## 2018-01-16 DIAGNOSIS — E118 Type 2 diabetes mellitus with unspecified complications: Secondary | ICD-10-CM | POA: Diagnosis not present

## 2018-01-16 DIAGNOSIS — D631 Anemia in chronic kidney disease: Secondary | ICD-10-CM | POA: Diagnosis not present

## 2018-01-16 DIAGNOSIS — N2581 Secondary hyperparathyroidism of renal origin: Secondary | ICD-10-CM | POA: Diagnosis not present

## 2018-01-16 DIAGNOSIS — D509 Iron deficiency anemia, unspecified: Secondary | ICD-10-CM | POA: Diagnosis not present

## 2018-01-16 DIAGNOSIS — N186 End stage renal disease: Secondary | ICD-10-CM | POA: Diagnosis not present

## 2018-01-18 DIAGNOSIS — D509 Iron deficiency anemia, unspecified: Secondary | ICD-10-CM | POA: Diagnosis not present

## 2018-01-18 DIAGNOSIS — E118 Type 2 diabetes mellitus with unspecified complications: Secondary | ICD-10-CM | POA: Diagnosis not present

## 2018-01-18 DIAGNOSIS — N186 End stage renal disease: Secondary | ICD-10-CM | POA: Diagnosis not present

## 2018-01-18 DIAGNOSIS — N2581 Secondary hyperparathyroidism of renal origin: Secondary | ICD-10-CM | POA: Diagnosis not present

## 2018-01-18 DIAGNOSIS — D631 Anemia in chronic kidney disease: Secondary | ICD-10-CM | POA: Diagnosis not present

## 2018-01-21 DIAGNOSIS — D509 Iron deficiency anemia, unspecified: Secondary | ICD-10-CM | POA: Diagnosis not present

## 2018-01-21 DIAGNOSIS — E118 Type 2 diabetes mellitus with unspecified complications: Secondary | ICD-10-CM | POA: Diagnosis not present

## 2018-01-21 DIAGNOSIS — N2581 Secondary hyperparathyroidism of renal origin: Secondary | ICD-10-CM | POA: Diagnosis not present

## 2018-01-21 DIAGNOSIS — D631 Anemia in chronic kidney disease: Secondary | ICD-10-CM | POA: Diagnosis not present

## 2018-01-21 DIAGNOSIS — N186 End stage renal disease: Secondary | ICD-10-CM | POA: Diagnosis not present

## 2018-01-23 DIAGNOSIS — N2581 Secondary hyperparathyroidism of renal origin: Secondary | ICD-10-CM | POA: Diagnosis not present

## 2018-01-23 DIAGNOSIS — E118 Type 2 diabetes mellitus with unspecified complications: Secondary | ICD-10-CM | POA: Diagnosis not present

## 2018-01-23 DIAGNOSIS — N186 End stage renal disease: Secondary | ICD-10-CM | POA: Diagnosis not present

## 2018-01-23 DIAGNOSIS — D631 Anemia in chronic kidney disease: Secondary | ICD-10-CM | POA: Diagnosis not present

## 2018-01-23 DIAGNOSIS — D509 Iron deficiency anemia, unspecified: Secondary | ICD-10-CM | POA: Diagnosis not present

## 2018-01-25 DIAGNOSIS — E1129 Type 2 diabetes mellitus with other diabetic kidney complication: Secondary | ICD-10-CM | POA: Diagnosis not present

## 2018-01-25 DIAGNOSIS — Z992 Dependence on renal dialysis: Secondary | ICD-10-CM | POA: Diagnosis not present

## 2018-01-25 DIAGNOSIS — D631 Anemia in chronic kidney disease: Secondary | ICD-10-CM | POA: Diagnosis not present

## 2018-01-25 DIAGNOSIS — N186 End stage renal disease: Secondary | ICD-10-CM | POA: Diagnosis not present

## 2018-01-25 DIAGNOSIS — E118 Type 2 diabetes mellitus with unspecified complications: Secondary | ICD-10-CM | POA: Diagnosis not present

## 2018-01-25 DIAGNOSIS — D509 Iron deficiency anemia, unspecified: Secondary | ICD-10-CM | POA: Diagnosis not present

## 2018-01-25 DIAGNOSIS — N2581 Secondary hyperparathyroidism of renal origin: Secondary | ICD-10-CM | POA: Diagnosis not present

## 2018-01-28 DIAGNOSIS — D631 Anemia in chronic kidney disease: Secondary | ICD-10-CM | POA: Diagnosis not present

## 2018-01-28 DIAGNOSIS — E118 Type 2 diabetes mellitus with unspecified complications: Secondary | ICD-10-CM | POA: Diagnosis not present

## 2018-01-28 DIAGNOSIS — N186 End stage renal disease: Secondary | ICD-10-CM | POA: Diagnosis not present

## 2018-01-28 DIAGNOSIS — D509 Iron deficiency anemia, unspecified: Secondary | ICD-10-CM | POA: Diagnosis not present

## 2018-01-28 DIAGNOSIS — N2581 Secondary hyperparathyroidism of renal origin: Secondary | ICD-10-CM | POA: Diagnosis not present

## 2018-01-30 DIAGNOSIS — N186 End stage renal disease: Secondary | ICD-10-CM | POA: Diagnosis not present

## 2018-01-30 DIAGNOSIS — N2581 Secondary hyperparathyroidism of renal origin: Secondary | ICD-10-CM | POA: Diagnosis not present

## 2018-01-30 DIAGNOSIS — E118 Type 2 diabetes mellitus with unspecified complications: Secondary | ICD-10-CM | POA: Diagnosis not present

## 2018-01-30 DIAGNOSIS — D631 Anemia in chronic kidney disease: Secondary | ICD-10-CM | POA: Diagnosis not present

## 2018-01-30 DIAGNOSIS — D509 Iron deficiency anemia, unspecified: Secondary | ICD-10-CM | POA: Diagnosis not present

## 2018-02-01 DIAGNOSIS — N186 End stage renal disease: Secondary | ICD-10-CM | POA: Diagnosis not present

## 2018-02-01 DIAGNOSIS — E118 Type 2 diabetes mellitus with unspecified complications: Secondary | ICD-10-CM | POA: Diagnosis not present

## 2018-02-01 DIAGNOSIS — D509 Iron deficiency anemia, unspecified: Secondary | ICD-10-CM | POA: Diagnosis not present

## 2018-02-01 DIAGNOSIS — N2581 Secondary hyperparathyroidism of renal origin: Secondary | ICD-10-CM | POA: Diagnosis not present

## 2018-02-01 DIAGNOSIS — D631 Anemia in chronic kidney disease: Secondary | ICD-10-CM | POA: Diagnosis not present

## 2018-02-04 DIAGNOSIS — D631 Anemia in chronic kidney disease: Secondary | ICD-10-CM | POA: Diagnosis not present

## 2018-02-04 DIAGNOSIS — D509 Iron deficiency anemia, unspecified: Secondary | ICD-10-CM | POA: Diagnosis not present

## 2018-02-04 DIAGNOSIS — N2581 Secondary hyperparathyroidism of renal origin: Secondary | ICD-10-CM | POA: Diagnosis not present

## 2018-02-04 DIAGNOSIS — N186 End stage renal disease: Secondary | ICD-10-CM | POA: Diagnosis not present

## 2018-02-04 DIAGNOSIS — E118 Type 2 diabetes mellitus with unspecified complications: Secondary | ICD-10-CM | POA: Diagnosis not present

## 2018-02-06 DIAGNOSIS — E118 Type 2 diabetes mellitus with unspecified complications: Secondary | ICD-10-CM | POA: Diagnosis not present

## 2018-02-06 DIAGNOSIS — N2581 Secondary hyperparathyroidism of renal origin: Secondary | ICD-10-CM | POA: Diagnosis not present

## 2018-02-06 DIAGNOSIS — N186 End stage renal disease: Secondary | ICD-10-CM | POA: Diagnosis not present

## 2018-02-06 DIAGNOSIS — D509 Iron deficiency anemia, unspecified: Secondary | ICD-10-CM | POA: Diagnosis not present

## 2018-02-06 DIAGNOSIS — D631 Anemia in chronic kidney disease: Secondary | ICD-10-CM | POA: Diagnosis not present

## 2018-02-08 DIAGNOSIS — N186 End stage renal disease: Secondary | ICD-10-CM | POA: Diagnosis not present

## 2018-02-08 DIAGNOSIS — E118 Type 2 diabetes mellitus with unspecified complications: Secondary | ICD-10-CM | POA: Diagnosis not present

## 2018-02-08 DIAGNOSIS — D631 Anemia in chronic kidney disease: Secondary | ICD-10-CM | POA: Diagnosis not present

## 2018-02-08 DIAGNOSIS — D509 Iron deficiency anemia, unspecified: Secondary | ICD-10-CM | POA: Diagnosis not present

## 2018-02-08 DIAGNOSIS — N2581 Secondary hyperparathyroidism of renal origin: Secondary | ICD-10-CM | POA: Diagnosis not present

## 2018-02-11 DIAGNOSIS — D631 Anemia in chronic kidney disease: Secondary | ICD-10-CM | POA: Diagnosis not present

## 2018-02-11 DIAGNOSIS — N2581 Secondary hyperparathyroidism of renal origin: Secondary | ICD-10-CM | POA: Diagnosis not present

## 2018-02-11 DIAGNOSIS — N186 End stage renal disease: Secondary | ICD-10-CM | POA: Diagnosis not present

## 2018-02-11 DIAGNOSIS — D509 Iron deficiency anemia, unspecified: Secondary | ICD-10-CM | POA: Diagnosis not present

## 2018-02-11 DIAGNOSIS — E118 Type 2 diabetes mellitus with unspecified complications: Secondary | ICD-10-CM | POA: Diagnosis not present

## 2018-02-13 DIAGNOSIS — N2581 Secondary hyperparathyroidism of renal origin: Secondary | ICD-10-CM | POA: Diagnosis not present

## 2018-02-13 DIAGNOSIS — N186 End stage renal disease: Secondary | ICD-10-CM | POA: Diagnosis not present

## 2018-02-13 DIAGNOSIS — D509 Iron deficiency anemia, unspecified: Secondary | ICD-10-CM | POA: Diagnosis not present

## 2018-02-13 DIAGNOSIS — D631 Anemia in chronic kidney disease: Secondary | ICD-10-CM | POA: Diagnosis not present

## 2018-02-13 DIAGNOSIS — E118 Type 2 diabetes mellitus with unspecified complications: Secondary | ICD-10-CM | POA: Diagnosis not present

## 2018-02-15 DIAGNOSIS — E118 Type 2 diabetes mellitus with unspecified complications: Secondary | ICD-10-CM | POA: Diagnosis not present

## 2018-02-15 DIAGNOSIS — N2581 Secondary hyperparathyroidism of renal origin: Secondary | ICD-10-CM | POA: Diagnosis not present

## 2018-02-15 DIAGNOSIS — D509 Iron deficiency anemia, unspecified: Secondary | ICD-10-CM | POA: Diagnosis not present

## 2018-02-15 DIAGNOSIS — D631 Anemia in chronic kidney disease: Secondary | ICD-10-CM | POA: Diagnosis not present

## 2018-02-15 DIAGNOSIS — N186 End stage renal disease: Secondary | ICD-10-CM | POA: Diagnosis not present

## 2018-02-18 DIAGNOSIS — N2581 Secondary hyperparathyroidism of renal origin: Secondary | ICD-10-CM | POA: Diagnosis not present

## 2018-02-18 DIAGNOSIS — D509 Iron deficiency anemia, unspecified: Secondary | ICD-10-CM | POA: Diagnosis not present

## 2018-02-18 DIAGNOSIS — H40013 Open angle with borderline findings, low risk, bilateral: Secondary | ICD-10-CM | POA: Diagnosis not present

## 2018-02-18 DIAGNOSIS — E118 Type 2 diabetes mellitus with unspecified complications: Secondary | ICD-10-CM | POA: Diagnosis not present

## 2018-02-18 DIAGNOSIS — H2513 Age-related nuclear cataract, bilateral: Secondary | ICD-10-CM | POA: Diagnosis not present

## 2018-02-18 DIAGNOSIS — N186 End stage renal disease: Secondary | ICD-10-CM | POA: Diagnosis not present

## 2018-02-18 DIAGNOSIS — D631 Anemia in chronic kidney disease: Secondary | ICD-10-CM | POA: Diagnosis not present

## 2018-02-20 DIAGNOSIS — D509 Iron deficiency anemia, unspecified: Secondary | ICD-10-CM | POA: Diagnosis not present

## 2018-02-20 DIAGNOSIS — E118 Type 2 diabetes mellitus with unspecified complications: Secondary | ICD-10-CM | POA: Diagnosis not present

## 2018-02-20 DIAGNOSIS — N186 End stage renal disease: Secondary | ICD-10-CM | POA: Diagnosis not present

## 2018-02-20 DIAGNOSIS — D631 Anemia in chronic kidney disease: Secondary | ICD-10-CM | POA: Diagnosis not present

## 2018-02-20 DIAGNOSIS — N2581 Secondary hyperparathyroidism of renal origin: Secondary | ICD-10-CM | POA: Diagnosis not present

## 2018-02-22 DIAGNOSIS — N186 End stage renal disease: Secondary | ICD-10-CM | POA: Diagnosis not present

## 2018-02-22 DIAGNOSIS — D631 Anemia in chronic kidney disease: Secondary | ICD-10-CM | POA: Diagnosis not present

## 2018-02-22 DIAGNOSIS — D509 Iron deficiency anemia, unspecified: Secondary | ICD-10-CM | POA: Diagnosis not present

## 2018-02-22 DIAGNOSIS — E118 Type 2 diabetes mellitus with unspecified complications: Secondary | ICD-10-CM | POA: Diagnosis not present

## 2018-02-22 DIAGNOSIS — N2581 Secondary hyperparathyroidism of renal origin: Secondary | ICD-10-CM | POA: Diagnosis not present

## 2018-02-25 DIAGNOSIS — D631 Anemia in chronic kidney disease: Secondary | ICD-10-CM | POA: Diagnosis not present

## 2018-02-25 DIAGNOSIS — N186 End stage renal disease: Secondary | ICD-10-CM | POA: Diagnosis not present

## 2018-02-25 DIAGNOSIS — E118 Type 2 diabetes mellitus with unspecified complications: Secondary | ICD-10-CM | POA: Diagnosis not present

## 2018-02-25 DIAGNOSIS — E1129 Type 2 diabetes mellitus with other diabetic kidney complication: Secondary | ICD-10-CM | POA: Diagnosis not present

## 2018-02-25 DIAGNOSIS — N2581 Secondary hyperparathyroidism of renal origin: Secondary | ICD-10-CM | POA: Diagnosis not present

## 2018-02-25 DIAGNOSIS — Z992 Dependence on renal dialysis: Secondary | ICD-10-CM | POA: Diagnosis not present

## 2018-02-27 DIAGNOSIS — D631 Anemia in chronic kidney disease: Secondary | ICD-10-CM | POA: Diagnosis not present

## 2018-02-27 DIAGNOSIS — E118 Type 2 diabetes mellitus with unspecified complications: Secondary | ICD-10-CM | POA: Diagnosis not present

## 2018-02-27 DIAGNOSIS — N186 End stage renal disease: Secondary | ICD-10-CM | POA: Diagnosis not present

## 2018-02-27 DIAGNOSIS — N2581 Secondary hyperparathyroidism of renal origin: Secondary | ICD-10-CM | POA: Diagnosis not present

## 2018-03-01 DIAGNOSIS — N186 End stage renal disease: Secondary | ICD-10-CM | POA: Diagnosis not present

## 2018-03-01 DIAGNOSIS — N2581 Secondary hyperparathyroidism of renal origin: Secondary | ICD-10-CM | POA: Diagnosis not present

## 2018-03-01 DIAGNOSIS — E118 Type 2 diabetes mellitus with unspecified complications: Secondary | ICD-10-CM | POA: Diagnosis not present

## 2018-03-01 DIAGNOSIS — D631 Anemia in chronic kidney disease: Secondary | ICD-10-CM | POA: Diagnosis not present

## 2018-03-04 DIAGNOSIS — E118 Type 2 diabetes mellitus with unspecified complications: Secondary | ICD-10-CM | POA: Diagnosis not present

## 2018-03-04 DIAGNOSIS — N186 End stage renal disease: Secondary | ICD-10-CM | POA: Diagnosis not present

## 2018-03-04 DIAGNOSIS — D631 Anemia in chronic kidney disease: Secondary | ICD-10-CM | POA: Diagnosis not present

## 2018-03-04 DIAGNOSIS — N2581 Secondary hyperparathyroidism of renal origin: Secondary | ICD-10-CM | POA: Diagnosis not present

## 2018-03-06 DIAGNOSIS — N2581 Secondary hyperparathyroidism of renal origin: Secondary | ICD-10-CM | POA: Diagnosis not present

## 2018-03-06 DIAGNOSIS — E118 Type 2 diabetes mellitus with unspecified complications: Secondary | ICD-10-CM | POA: Diagnosis not present

## 2018-03-06 DIAGNOSIS — N186 End stage renal disease: Secondary | ICD-10-CM | POA: Diagnosis not present

## 2018-03-06 DIAGNOSIS — D631 Anemia in chronic kidney disease: Secondary | ICD-10-CM | POA: Diagnosis not present

## 2018-03-08 DIAGNOSIS — N2581 Secondary hyperparathyroidism of renal origin: Secondary | ICD-10-CM | POA: Diagnosis not present

## 2018-03-08 DIAGNOSIS — E118 Type 2 diabetes mellitus with unspecified complications: Secondary | ICD-10-CM | POA: Diagnosis not present

## 2018-03-08 DIAGNOSIS — N186 End stage renal disease: Secondary | ICD-10-CM | POA: Diagnosis not present

## 2018-03-08 DIAGNOSIS — D631 Anemia in chronic kidney disease: Secondary | ICD-10-CM | POA: Diagnosis not present

## 2018-03-11 DIAGNOSIS — E118 Type 2 diabetes mellitus with unspecified complications: Secondary | ICD-10-CM | POA: Diagnosis not present

## 2018-03-11 DIAGNOSIS — D631 Anemia in chronic kidney disease: Secondary | ICD-10-CM | POA: Diagnosis not present

## 2018-03-11 DIAGNOSIS — N2581 Secondary hyperparathyroidism of renal origin: Secondary | ICD-10-CM | POA: Diagnosis not present

## 2018-03-11 DIAGNOSIS — N186 End stage renal disease: Secondary | ICD-10-CM | POA: Diagnosis not present

## 2018-03-13 DIAGNOSIS — E118 Type 2 diabetes mellitus with unspecified complications: Secondary | ICD-10-CM | POA: Diagnosis not present

## 2018-03-13 DIAGNOSIS — N2581 Secondary hyperparathyroidism of renal origin: Secondary | ICD-10-CM | POA: Diagnosis not present

## 2018-03-13 DIAGNOSIS — D631 Anemia in chronic kidney disease: Secondary | ICD-10-CM | POA: Diagnosis not present

## 2018-03-13 DIAGNOSIS — N186 End stage renal disease: Secondary | ICD-10-CM | POA: Diagnosis not present

## 2018-03-15 DIAGNOSIS — D631 Anemia in chronic kidney disease: Secondary | ICD-10-CM | POA: Diagnosis not present

## 2018-03-15 DIAGNOSIS — N2581 Secondary hyperparathyroidism of renal origin: Secondary | ICD-10-CM | POA: Diagnosis not present

## 2018-03-15 DIAGNOSIS — E118 Type 2 diabetes mellitus with unspecified complications: Secondary | ICD-10-CM | POA: Diagnosis not present

## 2018-03-15 DIAGNOSIS — N186 End stage renal disease: Secondary | ICD-10-CM | POA: Diagnosis not present

## 2018-03-18 DIAGNOSIS — N186 End stage renal disease: Secondary | ICD-10-CM | POA: Diagnosis not present

## 2018-03-18 DIAGNOSIS — N2581 Secondary hyperparathyroidism of renal origin: Secondary | ICD-10-CM | POA: Diagnosis not present

## 2018-03-18 DIAGNOSIS — D631 Anemia in chronic kidney disease: Secondary | ICD-10-CM | POA: Diagnosis not present

## 2018-03-18 DIAGNOSIS — E118 Type 2 diabetes mellitus with unspecified complications: Secondary | ICD-10-CM | POA: Diagnosis not present

## 2018-03-20 DIAGNOSIS — E118 Type 2 diabetes mellitus with unspecified complications: Secondary | ICD-10-CM | POA: Diagnosis not present

## 2018-03-20 DIAGNOSIS — N2581 Secondary hyperparathyroidism of renal origin: Secondary | ICD-10-CM | POA: Diagnosis not present

## 2018-03-20 DIAGNOSIS — D631 Anemia in chronic kidney disease: Secondary | ICD-10-CM | POA: Diagnosis not present

## 2018-03-20 DIAGNOSIS — N186 End stage renal disease: Secondary | ICD-10-CM | POA: Diagnosis not present

## 2018-03-22 DIAGNOSIS — N2581 Secondary hyperparathyroidism of renal origin: Secondary | ICD-10-CM | POA: Diagnosis not present

## 2018-03-22 DIAGNOSIS — N186 End stage renal disease: Secondary | ICD-10-CM | POA: Diagnosis not present

## 2018-03-22 DIAGNOSIS — E118 Type 2 diabetes mellitus with unspecified complications: Secondary | ICD-10-CM | POA: Diagnosis not present

## 2018-03-22 DIAGNOSIS — D631 Anemia in chronic kidney disease: Secondary | ICD-10-CM | POA: Diagnosis not present

## 2018-03-24 NOTE — Patient Instructions (Addendum)

## 2018-03-24 NOTE — Progress Notes (Signed)
This very nice 62 y.o. [name] presents for 6 month follow up with HTN, HLD, T2_DM w/ESRD on Dialysis 3 x/ week and Vitamin D Deficiency.      Patient is treated for HTN  (1999) & BP has been controlled at home. Today's BP is at goal - 134/86.  In 2016 , she underwent elective AoVR w/ anesthesia complications w subsequent multisystems failure and was transferred to [facility] for ECMO and Dialysis. Patient has had no complaints of any cardiac type chest pain, palpitations, dyspnea / orthopnea / PND, dizziness, claudication, or dependent edema.     Hyperlipidemia is controlled with diet & meds. Patient denies myalgias or other med SE's. Last Lipids were at goal albeit sl elevated Trig's: Lab Results  Component Value Date   CHOL 139 12/18/2017   HDL 51 12/18/2017   LDLCALC 62 12/18/2017   TRIG 193 (H) 12/18/2017   CHOLHDL 2.7 12/18/2017      Also, the patient has history of Morbid Obesity (BMI 39+) and T2_NIDDM (2006) and after starting Dialysis was tapered off of Insulin and altho she has Novolin 70/30 for prn use, she has not required it.She is followed by Dr Joelyn Oms for her Dialysis. She has had no symptoms of reactive hypoglycemia, diabetic polys, paresthesias or visual blurring.  Last A1c was not at goal: Lab Results  Component Value Date   HGBA1C 6.6 (H) 12/18/2017      Further, the patient also has history of Vitamin D Deficiency ("12"/2008) and supplements vitamin D without any suspected side-effects. Last vitamin D was still very low: Lab Results  Component Value Date   VD25OH 35 12/18/2017   Current Outpatient Medications on File Prior to Visit  Medication Sig  . acetaminophen (TYLENOL) 500 MG tablet Take 1,000 mg by mouth every 6 (six) hours as needed (for pain.).  Marland Kitchen aspirin EC 81 MG tablet Take 81 mg by mouth daily.  . busPIRone (BUSPAR) 10 MG tablet Take 1 tablet (10 mg total) by mouth 2 (two) times daily.  . carvedilol (COREG) 6.25 MG tablet Take 1 tablet (6.25 mg total) by  mouth 2 (two) times daily.  . cinacalcet (SENSIPAR) 60 MG tablet Take 1 tablet (60 mg total) by mouth daily.  . insulin aspart protamine - aspart (NOVOLOG MIX 70/30 FLEXPEN) (70-30) 100 UNIT/ML FlexPen Inject 0.1 mLs (10 Units total) into the skin 2 (two) times daily. (Patient taking differently: Inject 10 Units into the skin 2 (two) times daily as needed (if blood sugar is above 180). )  . losartan (COZAAR) 50 MG tablet Take 1 tablet (50 mg total) by mouth daily.  Marland Kitchen RENVELA 800 MG tablet Take 1,600-4,000 mg by mouth 3 (three) times daily. 4000 mg (5 caps) with meals and 1600 mg (2 caps) with snacks  . rosuvastatin (CRESTOR) 20 MG tablet Take 1 tablet (20 mg total) by mouth daily.   No current facility-administered medications on file prior to visit.    Allergies  Allergen Reactions  . Minoxidil Other (See Comments)    Pericardial effusion  . Lipitor [Atorvastatin] Other (See Comments)    MYALGIAS > "pain in legs" Tolerates rosuvastatin  . Ace Inhibitors Other (See Comments)    REACTION: "not sure...think it made me drowsy all the time"   PMHx:   Past Medical History:  Diagnosis Date  . Anemia   . Chronic diastolic CHF (congestive heart failure) ([place])   . Chronic diastolic heart failure ([place])   . CKD (chronic  kidney disease) stage 4, GFR 15-29 ml/min (HCC)    dialysis M/W/F  . Constipation   . CVA (cerebral infarction) 1997   no residual deficit  . Diverticulitis   . DM (diabetes mellitus) ([place])    type 2  . Heart murmur   . History of cardiovascular stress test    a. Myoview Oct 2012 showed EF 49%, no ischemia, LVE  . HLD (hyperlipidemia)    takes Crestor daily  . Hypertension   . Pericardial effusion    chronic; felt to be poss related to minoxidil >> DC'd  . Pulmonary hypertension ([place]) 06/18/2015  . Respiratory failure, acute hypoxic, post-operative 07/08/2015   Requiring ECMO support  . S/P cardiac catheterization    a. R/L HC 06/18/15:  mLAD 30%; severe pulmo HTN with  PA sat 43%, CI 1.86, prominent V waves indicative of MR; resting hypoxemia O2 sat 86% on RA  . S/P minimally invasive mitral valve repair 07/08/2015   Complex valvuloplasty including artificial Gore-tex neochord placement x10 and 26 mm Sorin Memo 3D Rechord ring annuloplasty via right mini thoracotomy approach  . Severe mitral regurgitation   . Shortness of breath dyspnea   . Stroke ([place]) 1997   no residual effect  . Vitamin D deficiency   . Wears glasses    Immunization History  Administered Date(s) Administered  . Influenza Split 08/20/2012, 08/15/2017  . PPD Test 03/25/2014, 03/29/2015  . Pneumococcal Polysaccharide-23 09/11/2017  . Pneumococcal-Unspecified 07/28/2008  . Tdap 12/28/2008   Past Surgical History:  Procedure Laterality Date  . A/V FISTULAGRAM Right 08/16/2017   Procedure: A/V Fistulagram;  Surgeon: Conrad Marion, MD;  Location: [place] [facility];  Service: Cardiovascular;  Laterality: Right;  . AV FISTULA PLACEMENT Left 10/22/2015   Procedure: RADIOCEPHALIC ARTERIOVENOUS (AV) FISTULA CREATION;  Surgeon: Serafina Mitchell, MD;  Location: [place];  Service: Vascular;  Laterality: Left;  . AV FISTULA PLACEMENT Right 05/22/2017   Procedure: ARTERIOVENOUS (AV) FISTULA CREATION-RIGHT ARM;  Surgeon: Waynetta Sandy, MD;  Location: [place];  Service: Vascular;  Laterality: Right;  . CANNULATION FOR CARDIOPULMONARY BYPASS N/A 07/08/2015   Procedure: CANNULATION FOR ECMO;  Surgeon: Rexene Alberts, MD;  Location: [place];  Service: Open Heart Surgery;  Laterality: N/A;  . CARDIAC CATHETERIZATION    . CARDIAC CATHETERIZATION N/A 06/18/2015   Procedure: Right/Left Heart Cath and Coronary Angiography;  Surgeon: Jettie Booze, MD;  Location: [place] [facility];  Service: Cardiovascular;  Laterality: N/A;  . CESAREAN SECTION     x 2  . COLONOSCOPY    . EXCHANGE OF A DIALYSIS CATHETER N/A 06/28/2017   Procedure: EXCHANGE OF A DIALYSIS CATHETER;  Surgeon: Waynetta Sandy, MD;  Location: [place];  Service: Vascular;  Laterality: N/A;  . FISTULA SUPERFICIALIZATION Left 12/28/2015   Procedure: SUPERFICIALIZATION LEFT RADIOCEPHALIC FISTULA;  Surgeon: Serafina Mitchell, MD;  Location: [place];  Service: Vascular;  Laterality: Left;  . FISTULA SUPERFICIALIZATION Right 08/21/2017   Procedure: FISTULA SUPERFICIALIZATION RIGHT ARM;  Surgeon: Waynetta Sandy, MD;  Location: [place];  Service: Vascular;  Laterality: Right;  . INSERTION OF DIALYSIS CATHETER Right 04/28/2017   Procedure: INSERTION OF DIALYSIS CATHETER-RIGHT INTERNAL JUGULAR PLACEMENT;  Surgeon: Elam Dutch, MD;  Location: [place];  Service: Vascular;  Laterality: Right;  . LIGATION OF ARTERIOVENOUS  FISTULA Left 04/28/2017   Procedure: LIGATION OF ARTERIOVENOUS  FISTULA;  Surgeon: Elam Dutch, MD;  Location: [place];  Service: Vascular;  Laterality:  Left;  . MITRAL VALVE REPAIR Right 07/08/2015   Procedure: MINIMALLY INVASIVE MITRAL VALVE REPAIR with a 26 Sorin Memo 3D Rechord;  Surgeon: Rexene Alberts, MD;  Location: [place];  Service: Open Heart Surgery;  Laterality: Right;  . MULTIPLE EXTRACTIONS WITH ALVEOLOPLASTY N/A 06/30/2015   Procedure: MULTIPLE EXTRACTIONS OF TOOTH #'S 4  AND 30 WITH ALVEOLOPLASTY AND GROSS DEBRIDEMENT  OF REMAINING TEETH;  Surgeon: Lenn Cal, DDS;  Location: [place];  Service: Oral Surgery;  Laterality: N/A;  . PERIPHERAL VASCULAR CATHETERIZATION Left 03/28/2016   Procedure: A/V Fistulagram;  Surgeon: Serafina Mitchell, MD;  Location: [place] [facility];  Service: Cardiovascular;  Laterality: Left;  lower arm  . TEE WITHOUT CARDIOVERSION N/A 06/08/2015   Procedure: TRANSESOPHAGEAL ECHOCARDIOGRAM (TEE);  Surgeon: Lelon Perla, MD;  Location[facility];  Service: Cardiovascular;  Laterality: N/A;  . TEE WITHOUT CARDIOVERSION N/A 07/08/2015   Procedure: TRANSESOPHAGEAL ECHOCARDIOGRAM (TEE);  Surgeon: Rexene Alberts, MD;  Location: [place];  Service: Open Heart Surgery;   Laterality: N/A;   FHx:    Reviewed / unchanged  SHx:    Reviewed / unchanged   Systems Review:  Constitutional: Denies fever, chills, wt changes, headaches, insomnia, fatigue, night sweats, change in appetite. Eyes: Denies redness, blurred vision, diplopia, discharge, itchy, watery eyes.  ENT: Denies discharge, congestion, post nasal drip, epistaxis, sore throat, earache, hearing loss, dental pain, tinnitus, vertigo, sinus pain, snoring.  CV: Denies chest pain, palpitations, irregular heartbeat, syncope, dyspnea, diaphoresis, orthopnea, PND, claudication or edema. Respiratory: denies cough, dyspnea, DOE, pleurisy, hoarseness, laryngitis, wheezing.  Gastrointestinal: Denies dysphagia, odynophagia, heartburn, reflux, water brash, abdominal pain or cramps, nausea, vomiting, bloating, diarrhea, constipation, hematemesis, melena, hematochezia  or hemorrhoids. Genitourinary: Denies dysuria, frequency, urgency, nocturia, hesitancy, discharge, hematuria or flank pain. Musculoskeletal: Denies arthralgias, myalgias, stiffness, jt. swelling, pain, limping or strain/sprain.  Skin: Denies pruritus, rash, hives, warts, acne, eczema or change in skin lesion(s). Neuro: No weakness, tremor, incoordination, spasms, paresthesia or pain. Psychiatric: Denies confusion, memory loss or sensory loss. Endo: Denies change in weight, skin or hair change.  Heme/Lymph: No excessive bleeding, bruising or enlarged lymph nodes.  Physical Exam  BP 134/86   Pulse 84   Temp (!) 97.5 F (36.4 C)   Resp 16   Ht 5' (1.524 m)   Wt 197 lb 3.2 oz (89.4 kg)   BMI 38.51 kg/m   Appears  well nourished, well groomed  and in no distress.  Eyes: PERRLA, EOMs, conjunctiva no swelling or erythema. Sinuses: No frontal/maxillary tenderness ENT/Mouth: EAC's clear, TM's nl w/o erythema, bulging. Nares clear w/o erythema, swelling, exudates. Oropharynx clear without erythema or exudates. Oral hygiene is good. Tongue normal, non  obstructing. Hearing intact.  Neck: Supple. Thyroid not palpable. Car 2+/2+ without bruits, nodes or JVD. Chest: Respirations nl with BS clear & equal w/o rales, rhonchi, wheezing or stridor.  Cor: Heart sounds normal w/ regular rate and rhythm without sig. murmurs, gallops, clicks or rubs. Peripheral pulses normal and equal  without edema.  Abdomen: Soft & bowel sounds normal. Non-tender w/o guarding, rebound, hernias, masses or organomegaly.  Lymphatics: Unremarkable.  Musculoskeletal: Full ROM all peripheral extremities, joint stability, 5/5 strength and normal gait.  Skin: Warm, dry without exposed rashes, lesions or ecchymosis apparent.  Neuro: Cranial nerves intact, reflexes equal bilaterally. Sensory-motor testing grossly intact. Tendon reflexes grossly intact.  Pysch: Alert & oriented x 3.  Insight and judgement nl & appropriate. No ideations.  Assessment and Plan:  1. Essential hypertension  - Continue medication, monitor blood pressure at home.  - Continue DASH diet.  Reminder to go to the ER if any CP,  SOB, nausea, dizziness, severe HA, changes vision/speech.  - CBC with Differential/Platelet - COMPLETE METABOLIC PANEL WITH GFR - Magnesium - TSH  2. Hyperlipidemia, mixed  - Continue diet/meds, exercise,& lifestyle modifications.  - Continue monitor periodic cholesterol/liver & renal functions   - COMPLETE METABOLIC PANEL WITH GFR - Lipid panel - TSH  3. Insulin Dependent T2_DM  ([place])  - Continue diet, exercise, lifestyle modifications.  - Monitor appropriate labs.  - COMPLETE METABOLIC PANEL WITH GFR - Hemoglobin A1c - Insulin, random  4. Vitamin D deficiency  - Continue supplementation.   - VITAMIN D 25 Hydroxyl  5. ESRD on dialysis ([place])  - COMPLETE METABOLIC PANEL WITH GFR  6. Medication management  - CBC with Differential/Platelet - COMPLETE METABOLIC PANEL WITH GFR - Magnesium - Lipid panel - TSH - Hemoglobin A1c - Insulin, random -  VITAMIN D 25 Hydroxyl          Discussed  regular exercise, BP monitoring, weight control to achieve/maintain BMI less than 25 and discussed med and SE's. Recommended labs to assess and monitor clinical status with further disposition pending results of labs. Over 30 minutes of exam, counseling, chart review was performed.

## 2018-03-25 DIAGNOSIS — D631 Anemia in chronic kidney disease: Secondary | ICD-10-CM | POA: Diagnosis not present

## 2018-03-25 DIAGNOSIS — N186 End stage renal disease: Secondary | ICD-10-CM | POA: Diagnosis not present

## 2018-03-25 DIAGNOSIS — E118 Type 2 diabetes mellitus with unspecified complications: Secondary | ICD-10-CM | POA: Diagnosis not present

## 2018-03-25 DIAGNOSIS — N2581 Secondary hyperparathyroidism of renal origin: Secondary | ICD-10-CM | POA: Diagnosis not present

## 2018-03-26 ENCOUNTER — Encounter: Payer: Self-pay | Admitting: Internal Medicine

## 2018-03-26 ENCOUNTER — Ambulatory Visit (INDEPENDENT_AMBULATORY_CARE_PROVIDER_SITE_OTHER): Payer: Medicare Other | Admitting: Internal Medicine

## 2018-03-26 VITALS — BP 134/86 | HR 84 | Temp 97.5°F | Resp 16 | Ht 60.0 in | Wt 197.2 lb

## 2018-03-26 DIAGNOSIS — E559 Vitamin D deficiency, unspecified: Secondary | ICD-10-CM | POA: Diagnosis not present

## 2018-03-26 DIAGNOSIS — E119 Type 2 diabetes mellitus without complications: Secondary | ICD-10-CM

## 2018-03-26 DIAGNOSIS — Z79899 Other long term (current) drug therapy: Secondary | ICD-10-CM | POA: Diagnosis not present

## 2018-03-26 DIAGNOSIS — I10 Essential (primary) hypertension: Secondary | ICD-10-CM

## 2018-03-26 DIAGNOSIS — Z794 Long term (current) use of insulin: Secondary | ICD-10-CM | POA: Diagnosis not present

## 2018-03-26 DIAGNOSIS — E782 Mixed hyperlipidemia: Secondary | ICD-10-CM

## 2018-03-26 DIAGNOSIS — Z992 Dependence on renal dialysis: Secondary | ICD-10-CM | POA: Diagnosis not present

## 2018-03-26 DIAGNOSIS — N186 End stage renal disease: Secondary | ICD-10-CM | POA: Diagnosis not present

## 2018-03-26 LAB — CBC WITH DIFFERENTIAL/PLATELET
Basophils Absolute: 62 cells/uL (ref 0–200)
Basophils Relative: 1 %
Eosinophils Absolute: 304 cells/uL (ref 15–500)
Eosinophils Relative: 4.9 %
HCT: 33 % — ABNORMAL LOW (ref 35.0–45.0)
Hemoglobin: 10.9 g/dL — ABNORMAL LOW (ref 11.7–15.5)
Lymphs Abs: 1562 cells/uL (ref 850–3900)
MCH: 28.1 pg (ref 27.0–33.0)
MCHC: 33 g/dL (ref 32.0–36.0)
MCV: 85.1 fL (ref 80.0–100.0)
MPV: 11.3 fL (ref 7.5–12.5)
Monocytes Relative: 13.6 %
Neutro Abs: 3429 cells/uL (ref 1500–7800)
Neutrophils Relative %: 55.3 %
Platelets: 264 10*3/uL (ref 140–400)
RBC: 3.88 10*6/uL (ref 3.80–5.10)
RDW: 17 % — ABNORMAL HIGH (ref 11.0–15.0)
Total Lymphocyte: 25.2 %
WBC mixed population: 843 cells/uL (ref 200–950)
WBC: 6.2 10*3/uL (ref 3.8–10.8)

## 2018-03-26 LAB — TSH: TSH: 2.75 mIU/L (ref 0.40–4.50)

## 2018-03-26 MED ORDER — AZELASTINE HCL 0.1 % NA SOLN: NASAL | 3 refills | Status: AC

## 2018-03-27 DIAGNOSIS — D631 Anemia in chronic kidney disease: Secondary | ICD-10-CM | POA: Diagnosis not present

## 2018-03-27 DIAGNOSIS — Z992 Dependence on renal dialysis: Secondary | ICD-10-CM | POA: Diagnosis not present

## 2018-03-27 DIAGNOSIS — E118 Type 2 diabetes mellitus with unspecified complications: Secondary | ICD-10-CM | POA: Diagnosis not present

## 2018-03-27 DIAGNOSIS — N2581 Secondary hyperparathyroidism of renal origin: Secondary | ICD-10-CM | POA: Diagnosis not present

## 2018-03-27 DIAGNOSIS — N186 End stage renal disease: Secondary | ICD-10-CM | POA: Diagnosis not present

## 2018-03-27 DIAGNOSIS — E1129 Type 2 diabetes mellitus with other diabetic kidney complication: Secondary | ICD-10-CM | POA: Diagnosis not present

## 2018-03-27 LAB — COMPLETE METABOLIC PANEL WITH GFR
AG Ratio: 1.2 (calc) (ref 1.0–2.5)
ALT: 10 U/L (ref 6–29)
AST: 12 U/L (ref 10–35)
Albumin: 4.7 g/dL (ref 3.6–5.1)
Alkaline phosphatase (APISO): 72 U/L (ref 33–130)
BUN/Creatinine Ratio: 5 (calc) — ABNORMAL LOW (ref 6–22)
BUN: 39 mg/dL — ABNORMAL HIGH (ref 7–25)
CO2: 33 mmol/L — ABNORMAL HIGH (ref 20–32)
Calcium: 9.5 mg/dL (ref 8.6–10.4)
Chloride: 89 mmol/L — ABNORMAL LOW (ref 98–110)
Creat: 7.39 mg/dL — ABNORMAL HIGH (ref 0.50–0.99)
GFR, Est African American: 6 mL/min/{1.73_m2} — ABNORMAL LOW (ref >=60)
GFR, Est Non African American: 5 mL/min/{1.73_m2} — ABNORMAL LOW (ref >=60)
Globulin: 3.9 g/dL (calc) — ABNORMAL HIGH (ref 1.9–3.7)
Glucose, Bld: 140 mg/dL — ABNORMAL HIGH (ref 65–99)
Potassium: 4.1 mmol/L (ref 3.5–5.3)
Sodium: 137 mmol/L (ref 135–146)
Total Bilirubin: 0.4 mg/dL (ref 0.2–1.2)
Total Protein: 8.6 g/dL — ABNORMAL HIGH (ref 6.1–8.1)

## 2018-03-27 LAB — MAGNESIUM: Magnesium: 2.3 mg/dL (ref 1.5–2.5)

## 2018-03-27 LAB — LIPID PANEL
Cholesterol: 170 mg/dL (ref <200)
HDL: 42 mg/dL — ABNORMAL LOW (ref >50)
LDL Cholesterol (Calc): 98 mg/dL (calc)
Non-HDL Cholesterol (Calc): 128 mg/dL (calc) (ref <130)
Total CHOL/HDL Ratio: 4 (calc) (ref <5.0)
Triglycerides: 200 mg/dL — ABNORMAL HIGH (ref <150)

## 2018-03-27 LAB — HEMOGLOBIN A1C
Hgb A1c MFr Bld: 7 % of total Hgb — ABNORMAL HIGH (ref <5.7)
Mean Plasma Glucose: 154 (calc)
eAG (mmol/L): 8.5 (calc)

## 2018-03-27 LAB — VITAMIN D 25 HYDROXY (VIT D DEFICIENCY, FRACTURES): Vit D, 25-Hydroxy: 32 ng/mL (ref 30–100)

## 2018-03-27 LAB — INSULIN, RANDOM: Insulin: 51.6 u[IU]/mL — ABNORMAL HIGH (ref 2.0–19.6)

## 2018-03-29 ENCOUNTER — Other Ambulatory Visit: Payer: Self-pay | Admitting: Internal Medicine

## 2018-03-29 DIAGNOSIS — E118 Type 2 diabetes mellitus with unspecified complications: Secondary | ICD-10-CM | POA: Diagnosis not present

## 2018-03-29 DIAGNOSIS — D631 Anemia in chronic kidney disease: Secondary | ICD-10-CM | POA: Diagnosis not present

## 2018-03-29 DIAGNOSIS — N2581 Secondary hyperparathyroidism of renal origin: Secondary | ICD-10-CM | POA: Diagnosis not present

## 2018-03-29 DIAGNOSIS — N186 End stage renal disease: Secondary | ICD-10-CM | POA: Diagnosis not present

## 2018-04-01 DIAGNOSIS — N186 End stage renal disease: Secondary | ICD-10-CM | POA: Diagnosis not present

## 2018-04-01 DIAGNOSIS — D631 Anemia in chronic kidney disease: Secondary | ICD-10-CM | POA: Diagnosis not present

## 2018-04-01 DIAGNOSIS — N2581 Secondary hyperparathyroidism of renal origin: Secondary | ICD-10-CM | POA: Diagnosis not present

## 2018-04-01 DIAGNOSIS — E118 Type 2 diabetes mellitus with unspecified complications: Secondary | ICD-10-CM | POA: Diagnosis not present

## 2018-04-03 DIAGNOSIS — D631 Anemia in chronic kidney disease: Secondary | ICD-10-CM | POA: Diagnosis not present

## 2018-04-03 DIAGNOSIS — E118 Type 2 diabetes mellitus with unspecified complications: Secondary | ICD-10-CM | POA: Diagnosis not present

## 2018-04-03 DIAGNOSIS — N186 End stage renal disease: Secondary | ICD-10-CM | POA: Diagnosis not present

## 2018-04-03 DIAGNOSIS — N2581 Secondary hyperparathyroidism of renal origin: Secondary | ICD-10-CM | POA: Diagnosis not present

## 2018-04-05 DIAGNOSIS — N2581 Secondary hyperparathyroidism of renal origin: Secondary | ICD-10-CM | POA: Diagnosis not present

## 2018-04-05 DIAGNOSIS — D631 Anemia in chronic kidney disease: Secondary | ICD-10-CM | POA: Diagnosis not present

## 2018-04-05 DIAGNOSIS — E118 Type 2 diabetes mellitus with unspecified complications: Secondary | ICD-10-CM | POA: Diagnosis not present

## 2018-04-05 DIAGNOSIS — N186 End stage renal disease: Secondary | ICD-10-CM | POA: Diagnosis not present

## 2018-04-08 DIAGNOSIS — E118 Type 2 diabetes mellitus with unspecified complications: Secondary | ICD-10-CM | POA: Diagnosis not present

## 2018-04-08 DIAGNOSIS — D631 Anemia in chronic kidney disease: Secondary | ICD-10-CM | POA: Diagnosis not present

## 2018-04-08 DIAGNOSIS — N2581 Secondary hyperparathyroidism of renal origin: Secondary | ICD-10-CM | POA: Diagnosis not present

## 2018-04-08 DIAGNOSIS — N186 End stage renal disease: Secondary | ICD-10-CM | POA: Diagnosis not present

## 2018-04-09 ENCOUNTER — Encounter: Payer: Self-pay | Admitting: Internal Medicine

## 2018-04-10 DIAGNOSIS — N186 End stage renal disease: Secondary | ICD-10-CM | POA: Diagnosis not present

## 2018-04-10 DIAGNOSIS — D631 Anemia in chronic kidney disease: Secondary | ICD-10-CM | POA: Diagnosis not present

## 2018-04-10 DIAGNOSIS — E118 Type 2 diabetes mellitus with unspecified complications: Secondary | ICD-10-CM | POA: Diagnosis not present

## 2018-04-10 DIAGNOSIS — N2581 Secondary hyperparathyroidism of renal origin: Secondary | ICD-10-CM | POA: Diagnosis not present

## 2018-04-12 DIAGNOSIS — N186 End stage renal disease: Secondary | ICD-10-CM | POA: Diagnosis not present

## 2018-04-12 DIAGNOSIS — D631 Anemia in chronic kidney disease: Secondary | ICD-10-CM | POA: Diagnosis not present

## 2018-04-12 DIAGNOSIS — E118 Type 2 diabetes mellitus with unspecified complications: Secondary | ICD-10-CM | POA: Diagnosis not present

## 2018-04-12 DIAGNOSIS — N2581 Secondary hyperparathyroidism of renal origin: Secondary | ICD-10-CM | POA: Diagnosis not present

## 2018-04-15 DIAGNOSIS — N186 End stage renal disease: Secondary | ICD-10-CM | POA: Diagnosis not present

## 2018-04-15 DIAGNOSIS — N2581 Secondary hyperparathyroidism of renal origin: Secondary | ICD-10-CM | POA: Diagnosis not present

## 2018-04-15 DIAGNOSIS — D631 Anemia in chronic kidney disease: Secondary | ICD-10-CM | POA: Diagnosis not present

## 2018-04-15 DIAGNOSIS — E118 Type 2 diabetes mellitus with unspecified complications: Secondary | ICD-10-CM | POA: Diagnosis not present

## 2018-04-17 DIAGNOSIS — E118 Type 2 diabetes mellitus with unspecified complications: Secondary | ICD-10-CM | POA: Diagnosis not present

## 2018-04-17 DIAGNOSIS — N186 End stage renal disease: Secondary | ICD-10-CM | POA: Diagnosis not present

## 2018-04-17 DIAGNOSIS — D631 Anemia in chronic kidney disease: Secondary | ICD-10-CM | POA: Diagnosis not present

## 2018-04-17 DIAGNOSIS — N2581 Secondary hyperparathyroidism of renal origin: Secondary | ICD-10-CM | POA: Diagnosis not present

## 2018-04-19 DIAGNOSIS — E118 Type 2 diabetes mellitus with unspecified complications: Secondary | ICD-10-CM | POA: Diagnosis not present

## 2018-04-19 DIAGNOSIS — D631 Anemia in chronic kidney disease: Secondary | ICD-10-CM | POA: Diagnosis not present

## 2018-04-19 DIAGNOSIS — N2581 Secondary hyperparathyroidism of renal origin: Secondary | ICD-10-CM | POA: Diagnosis not present

## 2018-04-19 DIAGNOSIS — N186 End stage renal disease: Secondary | ICD-10-CM | POA: Diagnosis not present

## 2018-04-22 ENCOUNTER — Other Ambulatory Visit: Payer: Self-pay | Admitting: Internal Medicine

## 2018-04-22 DIAGNOSIS — E118 Type 2 diabetes mellitus with unspecified complications: Secondary | ICD-10-CM | POA: Diagnosis not present

## 2018-04-22 DIAGNOSIS — D631 Anemia in chronic kidney disease: Secondary | ICD-10-CM | POA: Diagnosis not present

## 2018-04-22 DIAGNOSIS — N186 End stage renal disease: Secondary | ICD-10-CM | POA: Diagnosis not present

## 2018-04-22 DIAGNOSIS — N2581 Secondary hyperparathyroidism of renal origin: Secondary | ICD-10-CM | POA: Diagnosis not present

## 2018-04-24 DIAGNOSIS — D631 Anemia in chronic kidney disease: Secondary | ICD-10-CM | POA: Diagnosis not present

## 2018-04-24 DIAGNOSIS — N2581 Secondary hyperparathyroidism of renal origin: Secondary | ICD-10-CM | POA: Diagnosis not present

## 2018-04-24 DIAGNOSIS — N186 End stage renal disease: Secondary | ICD-10-CM | POA: Diagnosis not present

## 2018-04-24 DIAGNOSIS — E118 Type 2 diabetes mellitus with unspecified complications: Secondary | ICD-10-CM | POA: Diagnosis not present

## 2018-04-25 ENCOUNTER — Other Ambulatory Visit: Payer: Self-pay

## 2018-04-25 MED ORDER — LOSARTAN POTASSIUM 50 MG PO TABS: 50.0000 mg | ORAL_TABLET | Freq: Every day | ORAL | 1 refills | Status: DC

## 2018-04-26 DIAGNOSIS — N2581 Secondary hyperparathyroidism of renal origin: Secondary | ICD-10-CM | POA: Diagnosis not present

## 2018-04-26 DIAGNOSIS — D631 Anemia in chronic kidney disease: Secondary | ICD-10-CM | POA: Diagnosis not present

## 2018-04-26 DIAGNOSIS — N186 End stage renal disease: Secondary | ICD-10-CM | POA: Diagnosis not present

## 2018-04-26 DIAGNOSIS — E118 Type 2 diabetes mellitus with unspecified complications: Secondary | ICD-10-CM | POA: Diagnosis not present

## 2018-04-27 DIAGNOSIS — Z992 Dependence on renal dialysis: Secondary | ICD-10-CM | POA: Diagnosis not present

## 2018-04-27 DIAGNOSIS — N186 End stage renal disease: Secondary | ICD-10-CM | POA: Diagnosis not present

## 2018-04-27 DIAGNOSIS — E1129 Type 2 diabetes mellitus with other diabetic kidney complication: Secondary | ICD-10-CM | POA: Diagnosis not present

## 2018-04-29 DIAGNOSIS — D631 Anemia in chronic kidney disease: Secondary | ICD-10-CM | POA: Diagnosis not present

## 2018-04-29 DIAGNOSIS — E118 Type 2 diabetes mellitus with unspecified complications: Secondary | ICD-10-CM | POA: Diagnosis not present

## 2018-04-29 DIAGNOSIS — N2581 Secondary hyperparathyroidism of renal origin: Secondary | ICD-10-CM | POA: Diagnosis not present

## 2018-04-29 DIAGNOSIS — N186 End stage renal disease: Secondary | ICD-10-CM | POA: Diagnosis not present

## 2018-05-01 DIAGNOSIS — N2581 Secondary hyperparathyroidism of renal origin: Secondary | ICD-10-CM | POA: Diagnosis not present

## 2018-05-01 DIAGNOSIS — N186 End stage renal disease: Secondary | ICD-10-CM | POA: Diagnosis not present

## 2018-05-01 DIAGNOSIS — D631 Anemia in chronic kidney disease: Secondary | ICD-10-CM | POA: Diagnosis not present

## 2018-05-01 DIAGNOSIS — E118 Type 2 diabetes mellitus with unspecified complications: Secondary | ICD-10-CM | POA: Diagnosis not present

## 2018-05-03 DIAGNOSIS — E118 Type 2 diabetes mellitus with unspecified complications: Secondary | ICD-10-CM | POA: Diagnosis not present

## 2018-05-03 DIAGNOSIS — D631 Anemia in chronic kidney disease: Secondary | ICD-10-CM | POA: Diagnosis not present

## 2018-05-03 DIAGNOSIS — N2581 Secondary hyperparathyroidism of renal origin: Secondary | ICD-10-CM | POA: Diagnosis not present

## 2018-05-03 DIAGNOSIS — N186 End stage renal disease: Secondary | ICD-10-CM | POA: Diagnosis not present

## 2018-05-06 DIAGNOSIS — N2581 Secondary hyperparathyroidism of renal origin: Secondary | ICD-10-CM | POA: Diagnosis not present

## 2018-05-06 DIAGNOSIS — N186 End stage renal disease: Secondary | ICD-10-CM | POA: Diagnosis not present

## 2018-05-06 DIAGNOSIS — E118 Type 2 diabetes mellitus with unspecified complications: Secondary | ICD-10-CM | POA: Diagnosis not present

## 2018-05-06 DIAGNOSIS — D631 Anemia in chronic kidney disease: Secondary | ICD-10-CM | POA: Diagnosis not present

## 2018-05-08 DIAGNOSIS — D631 Anemia in chronic kidney disease: Secondary | ICD-10-CM | POA: Diagnosis not present

## 2018-05-08 DIAGNOSIS — N2581 Secondary hyperparathyroidism of renal origin: Secondary | ICD-10-CM | POA: Diagnosis not present

## 2018-05-08 DIAGNOSIS — N186 End stage renal disease: Secondary | ICD-10-CM | POA: Diagnosis not present

## 2018-05-08 DIAGNOSIS — E118 Type 2 diabetes mellitus with unspecified complications: Secondary | ICD-10-CM | POA: Diagnosis not present

## 2018-05-10 DIAGNOSIS — N186 End stage renal disease: Secondary | ICD-10-CM | POA: Diagnosis not present

## 2018-05-10 DIAGNOSIS — E118 Type 2 diabetes mellitus with unspecified complications: Secondary | ICD-10-CM | POA: Diagnosis not present

## 2018-05-10 DIAGNOSIS — D631 Anemia in chronic kidney disease: Secondary | ICD-10-CM | POA: Diagnosis not present

## 2018-05-10 DIAGNOSIS — N2581 Secondary hyperparathyroidism of renal origin: Secondary | ICD-10-CM | POA: Diagnosis not present

## 2018-05-13 DIAGNOSIS — N186 End stage renal disease: Secondary | ICD-10-CM | POA: Diagnosis not present

## 2018-05-13 DIAGNOSIS — D631 Anemia in chronic kidney disease: Secondary | ICD-10-CM | POA: Diagnosis not present

## 2018-05-13 DIAGNOSIS — E118 Type 2 diabetes mellitus with unspecified complications: Secondary | ICD-10-CM | POA: Diagnosis not present

## 2018-05-13 DIAGNOSIS — N2581 Secondary hyperparathyroidism of renal origin: Secondary | ICD-10-CM | POA: Diagnosis not present

## 2018-05-15 DIAGNOSIS — N186 End stage renal disease: Secondary | ICD-10-CM | POA: Diagnosis not present

## 2018-05-15 DIAGNOSIS — N2581 Secondary hyperparathyroidism of renal origin: Secondary | ICD-10-CM | POA: Diagnosis not present

## 2018-05-15 DIAGNOSIS — E118 Type 2 diabetes mellitus with unspecified complications: Secondary | ICD-10-CM | POA: Diagnosis not present

## 2018-05-15 DIAGNOSIS — D631 Anemia in chronic kidney disease: Secondary | ICD-10-CM | POA: Diagnosis not present

## 2018-05-17 DIAGNOSIS — N2581 Secondary hyperparathyroidism of renal origin: Secondary | ICD-10-CM | POA: Diagnosis not present

## 2018-05-17 DIAGNOSIS — N186 End stage renal disease: Secondary | ICD-10-CM | POA: Diagnosis not present

## 2018-05-17 DIAGNOSIS — E118 Type 2 diabetes mellitus with unspecified complications: Secondary | ICD-10-CM | POA: Diagnosis not present

## 2018-05-17 DIAGNOSIS — D631 Anemia in chronic kidney disease: Secondary | ICD-10-CM | POA: Diagnosis not present

## 2018-05-20 DIAGNOSIS — E118 Type 2 diabetes mellitus with unspecified complications: Secondary | ICD-10-CM | POA: Diagnosis not present

## 2018-05-20 DIAGNOSIS — N186 End stage renal disease: Secondary | ICD-10-CM | POA: Diagnosis not present

## 2018-05-20 DIAGNOSIS — D631 Anemia in chronic kidney disease: Secondary | ICD-10-CM | POA: Diagnosis not present

## 2018-05-20 DIAGNOSIS — N2581 Secondary hyperparathyroidism of renal origin: Secondary | ICD-10-CM | POA: Diagnosis not present

## 2018-05-22 DIAGNOSIS — N186 End stage renal disease: Secondary | ICD-10-CM | POA: Diagnosis not present

## 2018-05-22 DIAGNOSIS — E118 Type 2 diabetes mellitus with unspecified complications: Secondary | ICD-10-CM | POA: Diagnosis not present

## 2018-05-22 DIAGNOSIS — N2581 Secondary hyperparathyroidism of renal origin: Secondary | ICD-10-CM | POA: Diagnosis not present

## 2018-05-22 DIAGNOSIS — D631 Anemia in chronic kidney disease: Secondary | ICD-10-CM | POA: Diagnosis not present

## 2018-05-24 DIAGNOSIS — N186 End stage renal disease: Secondary | ICD-10-CM | POA: Diagnosis not present

## 2018-05-24 DIAGNOSIS — N2581 Secondary hyperparathyroidism of renal origin: Secondary | ICD-10-CM | POA: Diagnosis not present

## 2018-05-24 DIAGNOSIS — E118 Type 2 diabetes mellitus with unspecified complications: Secondary | ICD-10-CM | POA: Diagnosis not present

## 2018-05-24 DIAGNOSIS — D631 Anemia in chronic kidney disease: Secondary | ICD-10-CM | POA: Diagnosis not present

## 2018-05-27 DIAGNOSIS — E1129 Type 2 diabetes mellitus with other diabetic kidney complication: Secondary | ICD-10-CM | POA: Diagnosis not present

## 2018-05-27 DIAGNOSIS — N2581 Secondary hyperparathyroidism of renal origin: Secondary | ICD-10-CM | POA: Diagnosis not present

## 2018-05-27 DIAGNOSIS — N186 End stage renal disease: Secondary | ICD-10-CM | POA: Diagnosis not present

## 2018-05-27 DIAGNOSIS — Z992 Dependence on renal dialysis: Secondary | ICD-10-CM | POA: Diagnosis not present

## 2018-05-27 DIAGNOSIS — D631 Anemia in chronic kidney disease: Secondary | ICD-10-CM | POA: Diagnosis not present

## 2018-05-27 DIAGNOSIS — E118 Type 2 diabetes mellitus with unspecified complications: Secondary | ICD-10-CM | POA: Diagnosis not present

## 2018-05-29 DIAGNOSIS — E118 Type 2 diabetes mellitus with unspecified complications: Secondary | ICD-10-CM | POA: Diagnosis not present

## 2018-05-29 DIAGNOSIS — N2581 Secondary hyperparathyroidism of renal origin: Secondary | ICD-10-CM | POA: Diagnosis not present

## 2018-05-29 DIAGNOSIS — D631 Anemia in chronic kidney disease: Secondary | ICD-10-CM | POA: Diagnosis not present

## 2018-05-29 DIAGNOSIS — N186 End stage renal disease: Secondary | ICD-10-CM | POA: Diagnosis not present

## 2018-05-31 DIAGNOSIS — D631 Anemia in chronic kidney disease: Secondary | ICD-10-CM | POA: Diagnosis not present

## 2018-05-31 DIAGNOSIS — E118 Type 2 diabetes mellitus with unspecified complications: Secondary | ICD-10-CM | POA: Diagnosis not present

## 2018-05-31 DIAGNOSIS — N186 End stage renal disease: Secondary | ICD-10-CM | POA: Diagnosis not present

## 2018-05-31 DIAGNOSIS — N2581 Secondary hyperparathyroidism of renal origin: Secondary | ICD-10-CM | POA: Diagnosis not present

## 2018-06-03 DIAGNOSIS — N186 End stage renal disease: Secondary | ICD-10-CM | POA: Diagnosis not present

## 2018-06-03 DIAGNOSIS — N2581 Secondary hyperparathyroidism of renal origin: Secondary | ICD-10-CM | POA: Diagnosis not present

## 2018-06-03 DIAGNOSIS — D631 Anemia in chronic kidney disease: Secondary | ICD-10-CM | POA: Diagnosis not present

## 2018-06-03 DIAGNOSIS — E118 Type 2 diabetes mellitus with unspecified complications: Secondary | ICD-10-CM | POA: Diagnosis not present

## 2018-06-05 DIAGNOSIS — N186 End stage renal disease: Secondary | ICD-10-CM | POA: Diagnosis not present

## 2018-06-05 DIAGNOSIS — E118 Type 2 diabetes mellitus with unspecified complications: Secondary | ICD-10-CM | POA: Diagnosis not present

## 2018-06-05 DIAGNOSIS — D631 Anemia in chronic kidney disease: Secondary | ICD-10-CM | POA: Diagnosis not present

## 2018-06-05 DIAGNOSIS — N2581 Secondary hyperparathyroidism of renal origin: Secondary | ICD-10-CM | POA: Diagnosis not present

## 2018-06-07 DIAGNOSIS — D631 Anemia in chronic kidney disease: Secondary | ICD-10-CM | POA: Diagnosis not present

## 2018-06-07 DIAGNOSIS — N186 End stage renal disease: Secondary | ICD-10-CM | POA: Diagnosis not present

## 2018-06-07 DIAGNOSIS — E118 Type 2 diabetes mellitus with unspecified complications: Secondary | ICD-10-CM | POA: Diagnosis not present

## 2018-06-07 DIAGNOSIS — N2581 Secondary hyperparathyroidism of renal origin: Secondary | ICD-10-CM | POA: Diagnosis not present

## 2018-06-10 DIAGNOSIS — D631 Anemia in chronic kidney disease: Secondary | ICD-10-CM | POA: Diagnosis not present

## 2018-06-10 DIAGNOSIS — N186 End stage renal disease: Secondary | ICD-10-CM | POA: Diagnosis not present

## 2018-06-10 DIAGNOSIS — N2581 Secondary hyperparathyroidism of renal origin: Secondary | ICD-10-CM | POA: Diagnosis not present

## 2018-06-10 DIAGNOSIS — E118 Type 2 diabetes mellitus with unspecified complications: Secondary | ICD-10-CM | POA: Diagnosis not present

## 2018-06-12 DIAGNOSIS — N2581 Secondary hyperparathyroidism of renal origin: Secondary | ICD-10-CM | POA: Diagnosis not present

## 2018-06-12 DIAGNOSIS — D631 Anemia in chronic kidney disease: Secondary | ICD-10-CM | POA: Diagnosis not present

## 2018-06-12 DIAGNOSIS — E118 Type 2 diabetes mellitus with unspecified complications: Secondary | ICD-10-CM | POA: Diagnosis not present

## 2018-06-12 DIAGNOSIS — N186 End stage renal disease: Secondary | ICD-10-CM | POA: Diagnosis not present

## 2018-06-14 DIAGNOSIS — N2581 Secondary hyperparathyroidism of renal origin: Secondary | ICD-10-CM | POA: Diagnosis not present

## 2018-06-14 DIAGNOSIS — E118 Type 2 diabetes mellitus with unspecified complications: Secondary | ICD-10-CM | POA: Diagnosis not present

## 2018-06-14 DIAGNOSIS — N186 End stage renal disease: Secondary | ICD-10-CM | POA: Diagnosis not present

## 2018-06-14 DIAGNOSIS — D631 Anemia in chronic kidney disease: Secondary | ICD-10-CM | POA: Diagnosis not present

## 2018-06-17 DIAGNOSIS — E118 Type 2 diabetes mellitus with unspecified complications: Secondary | ICD-10-CM | POA: Diagnosis not present

## 2018-06-17 DIAGNOSIS — N2581 Secondary hyperparathyroidism of renal origin: Secondary | ICD-10-CM | POA: Diagnosis not present

## 2018-06-17 DIAGNOSIS — D631 Anemia in chronic kidney disease: Secondary | ICD-10-CM | POA: Diagnosis not present

## 2018-06-17 DIAGNOSIS — N186 End stage renal disease: Secondary | ICD-10-CM | POA: Diagnosis not present

## 2018-06-19 DIAGNOSIS — N186 End stage renal disease: Secondary | ICD-10-CM | POA: Diagnosis not present

## 2018-06-19 DIAGNOSIS — D631 Anemia in chronic kidney disease: Secondary | ICD-10-CM | POA: Diagnosis not present

## 2018-06-19 DIAGNOSIS — E118 Type 2 diabetes mellitus with unspecified complications: Secondary | ICD-10-CM | POA: Diagnosis not present

## 2018-06-19 DIAGNOSIS — N2581 Secondary hyperparathyroidism of renal origin: Secondary | ICD-10-CM | POA: Diagnosis not present

## 2018-06-21 DIAGNOSIS — E118 Type 2 diabetes mellitus with unspecified complications: Secondary | ICD-10-CM | POA: Diagnosis not present

## 2018-06-21 DIAGNOSIS — N186 End stage renal disease: Secondary | ICD-10-CM | POA: Diagnosis not present

## 2018-06-21 DIAGNOSIS — N2581 Secondary hyperparathyroidism of renal origin: Secondary | ICD-10-CM | POA: Diagnosis not present

## 2018-06-21 DIAGNOSIS — D631 Anemia in chronic kidney disease: Secondary | ICD-10-CM | POA: Diagnosis not present

## 2018-06-24 DIAGNOSIS — D631 Anemia in chronic kidney disease: Secondary | ICD-10-CM | POA: Diagnosis not present

## 2018-06-24 DIAGNOSIS — N186 End stage renal disease: Secondary | ICD-10-CM | POA: Diagnosis not present

## 2018-06-24 DIAGNOSIS — N2581 Secondary hyperparathyroidism of renal origin: Secondary | ICD-10-CM | POA: Diagnosis not present

## 2018-06-24 DIAGNOSIS — E118 Type 2 diabetes mellitus with unspecified complications: Secondary | ICD-10-CM | POA: Diagnosis not present

## 2018-06-26 DIAGNOSIS — E118 Type 2 diabetes mellitus with unspecified complications: Secondary | ICD-10-CM | POA: Diagnosis not present

## 2018-06-26 DIAGNOSIS — N186 End stage renal disease: Secondary | ICD-10-CM | POA: Diagnosis not present

## 2018-06-26 DIAGNOSIS — N2581 Secondary hyperparathyroidism of renal origin: Secondary | ICD-10-CM | POA: Diagnosis not present

## 2018-06-26 DIAGNOSIS — D631 Anemia in chronic kidney disease: Secondary | ICD-10-CM | POA: Diagnosis not present

## 2018-06-27 DIAGNOSIS — Z992 Dependence on renal dialysis: Secondary | ICD-10-CM | POA: Diagnosis not present

## 2018-06-27 DIAGNOSIS — N186 End stage renal disease: Secondary | ICD-10-CM | POA: Diagnosis not present

## 2018-06-27 DIAGNOSIS — E1129 Type 2 diabetes mellitus with other diabetic kidney complication: Secondary | ICD-10-CM | POA: Diagnosis not present

## 2018-06-28 DIAGNOSIS — D631 Anemia in chronic kidney disease: Secondary | ICD-10-CM | POA: Diagnosis not present

## 2018-06-28 DIAGNOSIS — E118 Type 2 diabetes mellitus with unspecified complications: Secondary | ICD-10-CM | POA: Diagnosis not present

## 2018-06-28 DIAGNOSIS — N186 End stage renal disease: Secondary | ICD-10-CM | POA: Diagnosis not present

## 2018-06-28 DIAGNOSIS — N2581 Secondary hyperparathyroidism of renal origin: Secondary | ICD-10-CM | POA: Diagnosis not present

## 2018-07-01 DIAGNOSIS — N2581 Secondary hyperparathyroidism of renal origin: Secondary | ICD-10-CM | POA: Diagnosis not present

## 2018-07-01 DIAGNOSIS — D631 Anemia in chronic kidney disease: Secondary | ICD-10-CM | POA: Diagnosis not present

## 2018-07-01 DIAGNOSIS — N186 End stage renal disease: Secondary | ICD-10-CM | POA: Diagnosis not present

## 2018-07-01 DIAGNOSIS — E118 Type 2 diabetes mellitus with unspecified complications: Secondary | ICD-10-CM | POA: Diagnosis not present

## 2018-07-03 DIAGNOSIS — E118 Type 2 diabetes mellitus with unspecified complications: Secondary | ICD-10-CM | POA: Diagnosis not present

## 2018-07-03 DIAGNOSIS — D631 Anemia in chronic kidney disease: Secondary | ICD-10-CM | POA: Diagnosis not present

## 2018-07-03 DIAGNOSIS — N2581 Secondary hyperparathyroidism of renal origin: Secondary | ICD-10-CM | POA: Diagnosis not present

## 2018-07-03 DIAGNOSIS — N186 End stage renal disease: Secondary | ICD-10-CM | POA: Diagnosis not present

## 2018-07-03 NOTE — Progress Notes (Signed)
FOLLOW UP  Assessment and Plan:   Chronic diastolic heart failure ([place])       Followed by cardiology; edema resolved since dialysis  ESRD on dialysis [facility]) Doing well with dialysis after extensive fistula access issues; MWF; followed by Dr. Joelyn Oms -     CMP WITH GFR  Anemia in chronic kidney disease, on chronic dialysis ([place]) Followed by nephrology -     CBC with Differential/Platelet  Hypertension At goal with current regimen; mainly managed by dialysis Monitor blood pressure at home; patient to call if consistently greater than 130/80 Continue DASH diet.   Reminder to go to the ER if any CP, SOB, nausea, dizziness, severe HA, changes vision/speech, left arm numbness and tingling and jaw pain.  Cholesterol Currently at goal; continue statin Continue low cholesterol diet and exercise.  Check lipid panel.   Diabetes with diabetic chronic kidney disease Continue medication: novolin 70/30 PRN for glucose >180 Continue diet and exercise.  Perform daily foot/skin check, notify office of any concerning changes.  Check A1C  Obesity with co morbidities Long discussion about weight loss, diet, and exercise Recommended diet heavy in fruits and veggies and low in animal meats, cheeses, and dairy products, appropriate calorie intake Discussed ideal weight for height  Will follow up in 3 months  Vitamin D Def At goal at last visit;  Defer Vit D level  Left knee pain + crepitus; likely arthritis; declines imaging or ortho referral at this time Continue tylenol, suggested she try topical capsaicin or other OTC topical agents at night  Continue diet and meds as discussed. Further disposition pending results of labs. Discussed med's effects and SE's.   Over 30 minutes of exam, counseling, chart review, and critical decision making was performed.   Future Appointments  Date Time Provider [place]  07/04/2018  9:30 AM Liane Comber, NP [facility] None  10/08/2018  2:00 PM  Unk Pinto, MD [facility] None    ----------------------------------------------------------------------------------------------------------------------  HPI 62 y.o. female  presents for 3 month follow up on CHF, hypertension, cholesterol, diabetes, morbid obesity and vitamin D deficiency.   BMI is Body mass index is 39.1 kg/m., she has been working on diet and exercise, has been trying to walk up and down her driveway for exercise with her husband, but limited due to medical conditions and heat. He husband cooks, typically cooks lots of pasta and rice, fried foods, trying to limit this.  Wt Readings from Last 3 Encounters:  07/04/18 200 lb 3.2 oz (90.8 kg)  03/26/18 197 lb 3.2 oz (89.4 kg)  12/18/17 196 lb (88.9 kg)    Her blood pressure has been controlled at home, today their BP is BP: (!) 148/86  She does workout. She denies chest pain, shortness of breath, dizziness.   She is on cholesterol medication (rosuvastatin 20 mg daily) and denies myalgias. Her cholesterol is not at goal. The cholesterol last visit was:   Lab Results  Component Value Date   CHOL 170 03/26/2018   HDL 42 (L) 03/26/2018   LDLCALC 98 03/26/2018   TRIG 200 (H) 03/26/2018   CHOLHDL 4.0 03/26/2018    She has been working on diet and exercise for T2 diabetes (treated by novolin 70/30 PRN for sugars 180+, and denies foot ulcerations, increased appetite, nausea, paresthesia of the feet, polydipsia, polyuria, visual disturbances, vomiting and weight loss. Last A1C in the office was:  Lab Results  Component Value Date   HGBA1C 7.0 (H) 03/26/2018   She has ESRD on  dialysis:  Lab Results  Component Value Date   GFRAA 6 (L) 03/26/2018   Patient is on Vitamin D supplement.   Lab Results  Component Value Date   VD25OH 32 03/26/2018        Current Medications:  Current Outpatient Medications on File Prior to Visit  Medication Sig  . acetaminophen (TYLENOL) 500 MG tablet Take 1,000 mg by mouth every 6  (six) hours as needed (for pain.).  Marland Kitchen aspirin EC 81 MG tablet Take 81 mg by mouth daily.  Marland Kitchen azelastine (ASTELIN) 0.1 % nasal spray Use 1 to 2 sprays each nostril 2 to 3 x / day  . busPIRone (BUSPAR) 10 MG tablet Take 1 tablet (10 mg total) by mouth 2 (two) times daily.  . carvedilol (COREG) 6.25 MG tablet Take 1 tablet (6.25 mg total) by mouth 2 (two) times daily.  . cinacalcet (SENSIPAR) 60 MG tablet Take 1 tablet (60 mg total) by mouth daily.  . insulin aspart protamine - aspart (NOVOLOG MIX 70/30 FLEXPEN) (70-30) 100 UNIT/ML FlexPen Inject 0.1 mLs (10 Units total) into the skin 2 (two) times daily. (Patient taking differently: Inject 10 Units into the skin 2 (two) times daily as needed (if blood sugar is above 180). )  . RENVELA 800 MG tablet Take 1,600-4,000 mg by mouth 3 (three) times daily. 4000 mg (5 caps) with meals and 1600 mg (2 caps) with snacks  . rosuvastatin (CRESTOR) 20 MG tablet TAKE 1 TABLET BY MOUTH ONCE DAILY  . sertraline (ZOLOFT) 25 MG tablet TAKE 1 TABLET BY MOUTH DAILY   No current facility-administered medications on file prior to visit.      Allergies:  Allergies  Allergen Reactions  . Minoxidil Other (See Comments)    Pericardial effusion  . Lipitor [Atorvastatin] Other (See Comments)    MYALGIAS > "pain in legs" Tolerates rosuvastatin  . Ace Inhibitors Other (See Comments)    REACTION: "not sure...think it made me drowsy all the time"     Medical History:  Past Medical History:  Diagnosis Date  . Anemia   . Chronic diastolic CHF (congestive heart failure) ([place])   . Chronic diastolic heart failure ([place])   . CKD (chronic kidney disease) stage 4, GFR 15-29 ml/min (HCC)    dialysis M/W/F  . Constipation   . CVA (cerebral infarction) 1997   no residual deficit  . Diverticulitis   . DM (diabetes mellitus) ([place])    type 2  . Heart murmur   . History of cardiovascular stress test    a. Myoview Oct 2012 showed EF 49%, no ischemia, LVE  . HLD  (hyperlipidemia)    takes Crestor daily  . Hypertension   . Pericardial effusion    chronic; felt to be poss related to minoxidil >> DC'd  . Pulmonary hypertension ([place]) 06/18/2015  . Respiratory failure, acute hypoxic, post-operative 07/08/2015   Requiring ECMO support  . S/P cardiac catheterization    a. R/L HC 06/18/15:  mLAD 30%; severe pulmo HTN with PA sat 43%, CI 1.86, prominent V waves indicative of MR; resting hypoxemia O2 sat 86% on RA  . S/P minimally invasive mitral valve repair 07/08/2015   Complex valvuloplasty including artificial Gore-tex neochord placement x10 and 26 mm Sorin Memo 3D Rechord ring annuloplasty via right mini thoracotomy approach  . Severe mitral regurgitation   . Shortness of breath dyspnea   . Stroke ([place]) 1997   no residual effect  . Vitamin D deficiency   . Wears  glasses    Family history- Reviewed and unchanged Social history- Reviewed and unchanged   Review of Systems:  Review of Systems  Constitutional: Negative for malaise/fatigue and weight loss.  HENT: Negative for hearing loss and tinnitus.   Eyes: Negative for blurred vision and double vision.  Respiratory: Negative for cough, shortness of breath and wheezing.   Cardiovascular: Negative for chest pain, palpitations, orthopnea, claudication and leg swelling.  Gastrointestinal: Negative for abdominal pain, blood in stool, constipation, diarrhea, heartburn, melena, nausea and vomiting.  Genitourinary: Negative.   Musculoskeletal: Positive for joint pain (left knee). Negative for myalgias.  Skin: Negative for rash.  Neurological: Negative for dizziness, tingling, sensory change, weakness and headaches.  Endo/Heme/Allergies: Negative for polydipsia.  Psychiatric/Behavioral: Negative.   All other systems reviewed and are negative.   Physical Exam: BP (!) 148/86   Pulse 82   Temp (!) 97.3 F (36.3 C)   Resp 16   Ht 5' (1.524 m)   Wt 200 lb 3.2 oz (90.8 kg)   SpO2 97%   BMI 39.10 kg/m   Wt Readings from Last 3 Encounters:  07/04/18 200 lb 3.2 oz (90.8 kg)  03/26/18 197 lb 3.2 oz (89.4 kg)  12/18/17 196 lb (88.9 kg)   General Appearance: Well nourished, in no apparent distress. Eyes: PERRLA, EOMs, conjunctiva no swelling or erythema Sinuses: No Frontal/maxillary tenderness ENT/Mouth: Ext aud canals clear, TMs without erythema, bulging. No erythema, swelling, or exudate on post pharynx.  Tonsils not swollen or erythematous. Hearing normal.  Neck: Supple, thyroid normal.  Respiratory: Respiratory effort normal, BS equal bilaterally without rales, rhonchi, wheezing or stridor.  Cardio: RRR with no MRGs. Brisk peripheral pulses without edema. Fistula of right forearm with + thrill.  Abdomen: Soft, + BS.  Non tender, no guarding, rebound, hernias, masses. Lymphatics: Non tender without lymphadenopathy.  Musculoskeletal: Full ROM, 5/5 strength, Normal gait. Bilateral knees with crepitus, no notable instability, effusion, erythema.  Skin: Warm, dry without rashes, lesions, ecchymosis.  Neuro: Cranial nerves intact. No cerebellar symptoms.  Psych: Awake and oriented X 3, normal affect, Insight and Judgment appropriate.    Jody Ribas, NP 9:24 AM [facility]

## 2018-07-04 ENCOUNTER — Ambulatory Visit (INDEPENDENT_AMBULATORY_CARE_PROVIDER_SITE_OTHER): Payer: Medicare Other | Admitting: Adult Health

## 2018-07-04 ENCOUNTER — Encounter: Payer: Self-pay | Admitting: Adult Health

## 2018-07-04 VITALS — BP 148/86 | HR 82 | Temp 97.3°F | Resp 16 | Ht 60.0 in | Wt 200.2 lb

## 2018-07-04 DIAGNOSIS — Z79899 Other long term (current) drug therapy: Secondary | ICD-10-CM | POA: Diagnosis not present

## 2018-07-04 DIAGNOSIS — Z992 Dependence on renal dialysis: Secondary | ICD-10-CM

## 2018-07-04 DIAGNOSIS — N186 End stage renal disease: Secondary | ICD-10-CM

## 2018-07-04 DIAGNOSIS — E559 Vitamin D deficiency, unspecified: Secondary | ICD-10-CM

## 2018-07-04 DIAGNOSIS — Z794 Long term (current) use of insulin: Secondary | ICD-10-CM | POA: Diagnosis not present

## 2018-07-04 DIAGNOSIS — D631 Anemia in chronic kidney disease: Secondary | ICD-10-CM

## 2018-07-04 DIAGNOSIS — I10 Essential (primary) hypertension: Secondary | ICD-10-CM | POA: Diagnosis not present

## 2018-07-04 DIAGNOSIS — I5032 Chronic diastolic (congestive) heart failure: Secondary | ICD-10-CM

## 2018-07-04 DIAGNOSIS — E782 Mixed hyperlipidemia: Secondary | ICD-10-CM | POA: Diagnosis not present

## 2018-07-04 DIAGNOSIS — E1129 Type 2 diabetes mellitus with other diabetic kidney complication: Secondary | ICD-10-CM | POA: Diagnosis not present

## 2018-07-04 DIAGNOSIS — IMO0001 Reserved for inherently not codable concepts without codable children: Secondary | ICD-10-CM

## 2018-07-04 NOTE — Patient Instructions (Signed)
  Recommend trying topical capsaicin cream or other arthritis cream, particularly at night. Tylenol is ok to take as well. Let me know if/when you'd like to proceed with ortho referral.     Aim for 7+ servings of fruits and vegetables daily  Limit animal fats in diet for cholesterol and heart health - choose grass fed whenever available  Avoid highly processed foods, and foods high in saturated/trans fats  Aim for low stress - take time to unwind and care for your mental health  Aim for 150 min of moderate intensity exercise weekly for heart health, and weights twice weekly for bone health  Aim for 7-9 hours of sleep daily      When it comes to diets, agreement about the perfect plan isn't easy to find, even among the experts. Experts at the [employer] developed an idea known as the Healthy Eating Plate. Just imagine a plate divided into logical, healthy portions.  The emphasis is on diet quality:  Load up on vegetables and fruits - one-half of your plate: Aim for color and variety, and remember that potatoes don't count.  Go for whole grains - one-quarter of your plate: Whole wheat, barley, wheat berries, quinoa, oats, brown rice, and foods made with them. If you want pasta, go with whole wheat pasta.  Protein power - one-quarter of your plate: Fish, chicken, beans, and nuts are all healthy, versatile protein sources. Limit red meat.  The diet, however, does go beyond the plate, offering a few other suggestions.  Use healthy plant oils, such as olive, canola, soy, corn, sunflower and peanut. Check the labels, and avoid partially hydrogenated oil, which have unhealthy trans fats.  If you're thirsty, drink water. Coffee and tea are good in moderation, but skip sugary drinks and limit milk and dairy products to one or two daily servings.  The type of carbohydrate in the diet is more important than the amount. Some sources of carbohydrates, such as vegetables,  fruits, whole grains, and beans-are healthier than others.  Finally, stay active.

## 2018-07-05 DIAGNOSIS — D631 Anemia in chronic kidney disease: Secondary | ICD-10-CM | POA: Diagnosis not present

## 2018-07-05 DIAGNOSIS — N2581 Secondary hyperparathyroidism of renal origin: Secondary | ICD-10-CM | POA: Diagnosis not present

## 2018-07-05 DIAGNOSIS — N186 End stage renal disease: Secondary | ICD-10-CM | POA: Diagnosis not present

## 2018-07-05 DIAGNOSIS — E118 Type 2 diabetes mellitus with unspecified complications: Secondary | ICD-10-CM | POA: Diagnosis not present

## 2018-07-05 LAB — COMPLETE METABOLIC PANEL WITH GFR
AG Ratio: 1.2 (calc) (ref 1.0–2.5)
ALT: 10 U/L (ref 6–29)
AST: 10 U/L (ref 10–35)
Albumin: 4.5 g/dL (ref 3.6–5.1)
Alkaline phosphatase (APISO): 62 U/L (ref 33–130)
BUN/Creatinine Ratio: 5 (calc) — ABNORMAL LOW (ref 6–22)
BUN: 37 mg/dL — ABNORMAL HIGH (ref 7–25)
CO2: 30 mmol/L (ref 20–32)
Calcium: 9.4 mg/dL (ref 8.6–10.4)
Chloride: 95 mmol/L — ABNORMAL LOW (ref 98–110)
Creat: 7.3 mg/dL — ABNORMAL HIGH (ref 0.50–0.99)
GFR, Est African American: 6 mL/min/{1.73_m2} — ABNORMAL LOW (ref >=60)
GFR, Est Non African American: 5 mL/min/{1.73_m2} — ABNORMAL LOW (ref >=60)
Globulin: 3.9 g/dL (calc) — ABNORMAL HIGH (ref 1.9–3.7)
Glucose, Bld: 218 mg/dL — ABNORMAL HIGH (ref 65–99)
Potassium: 5 mmol/L (ref 3.5–5.3)
Sodium: 137 mmol/L (ref 135–146)
Total Bilirubin: 0.4 mg/dL (ref 0.2–1.2)
Total Protein: 8.4 g/dL — ABNORMAL HIGH (ref 6.1–8.1)

## 2018-07-05 LAB — LIPID PANEL
Cholesterol: 167 mg/dL (ref <200)
HDL: 42 mg/dL — ABNORMAL LOW (ref >50)
LDL Cholesterol (Calc): 96 mg/dL (calc)
Non-HDL Cholesterol (Calc): 125 mg/dL (calc) (ref <130)
Total CHOL/HDL Ratio: 4 (calc) (ref <5.0)
Triglycerides: 196 mg/dL — ABNORMAL HIGH (ref <150)

## 2018-07-05 LAB — CBC WITH DIFFERENTIAL/PLATELET
Basophils Absolute: 49 cells/uL (ref 0–200)
Basophils Relative: 1 %
Eosinophils Absolute: 274 cells/uL (ref 15–500)
Eosinophils Relative: 5.6 %
HCT: 36.6 % (ref 35.0–45.0)
Hemoglobin: 11.7 g/dL (ref 11.7–15.5)
Lymphs Abs: 1455 cells/uL (ref 850–3900)
MCH: 28.4 pg (ref 27.0–33.0)
MCHC: 32 g/dL (ref 32.0–36.0)
MCV: 88.8 fL (ref 80.0–100.0)
MPV: 11.7 fL (ref 7.5–12.5)
Monocytes Relative: 10.9 %
Neutro Abs: 2587 cells/uL (ref 1500–7800)
Neutrophils Relative %: 52.8 %
Platelets: 242 10*3/uL (ref 140–400)
RBC: 4.12 10*6/uL (ref 3.80–5.10)
RDW: 15 % (ref 11.0–15.0)
Total Lymphocyte: 29.7 %
WBC mixed population: 534 cells/uL (ref 200–950)
WBC: 4.9 10*3/uL (ref 3.8–10.8)

## 2018-07-05 LAB — MAGNESIUM: Magnesium: 2.3 mg/dL (ref 1.5–2.5)

## 2018-07-05 LAB — HEMOGLOBIN A1C
Hgb A1c MFr Bld: 7.2 % of total Hgb — ABNORMAL HIGH (ref <5.7)
Mean Plasma Glucose: 160 (calc)
eAG (mmol/L): 8.9 (calc)

## 2018-07-05 LAB — TSH: TSH: 3.3 mIU/L (ref 0.40–4.50)

## 2018-07-08 DIAGNOSIS — D631 Anemia in chronic kidney disease: Secondary | ICD-10-CM | POA: Diagnosis not present

## 2018-07-08 DIAGNOSIS — E118 Type 2 diabetes mellitus with unspecified complications: Secondary | ICD-10-CM | POA: Diagnosis not present

## 2018-07-08 DIAGNOSIS — N2581 Secondary hyperparathyroidism of renal origin: Secondary | ICD-10-CM | POA: Diagnosis not present

## 2018-07-08 DIAGNOSIS — N186 End stage renal disease: Secondary | ICD-10-CM | POA: Diagnosis not present

## 2018-07-10 DIAGNOSIS — D631 Anemia in chronic kidney disease: Secondary | ICD-10-CM | POA: Diagnosis not present

## 2018-07-10 DIAGNOSIS — E118 Type 2 diabetes mellitus with unspecified complications: Secondary | ICD-10-CM | POA: Diagnosis not present

## 2018-07-10 DIAGNOSIS — N2581 Secondary hyperparathyroidism of renal origin: Secondary | ICD-10-CM | POA: Diagnosis not present

## 2018-07-10 DIAGNOSIS — N186 End stage renal disease: Secondary | ICD-10-CM | POA: Diagnosis not present

## 2018-07-12 DIAGNOSIS — N186 End stage renal disease: Secondary | ICD-10-CM | POA: Diagnosis not present

## 2018-07-12 DIAGNOSIS — N2581 Secondary hyperparathyroidism of renal origin: Secondary | ICD-10-CM | POA: Diagnosis not present

## 2018-07-12 DIAGNOSIS — D631 Anemia in chronic kidney disease: Secondary | ICD-10-CM | POA: Diagnosis not present

## 2018-07-12 DIAGNOSIS — E118 Type 2 diabetes mellitus with unspecified complications: Secondary | ICD-10-CM | POA: Diagnosis not present

## 2018-07-15 DIAGNOSIS — N186 End stage renal disease: Secondary | ICD-10-CM | POA: Diagnosis not present

## 2018-07-15 DIAGNOSIS — N2581 Secondary hyperparathyroidism of renal origin: Secondary | ICD-10-CM | POA: Diagnosis not present

## 2018-07-15 DIAGNOSIS — D631 Anemia in chronic kidney disease: Secondary | ICD-10-CM | POA: Diagnosis not present

## 2018-07-15 DIAGNOSIS — E118 Type 2 diabetes mellitus with unspecified complications: Secondary | ICD-10-CM | POA: Diagnosis not present

## 2018-07-17 DIAGNOSIS — E118 Type 2 diabetes mellitus with unspecified complications: Secondary | ICD-10-CM | POA: Diagnosis not present

## 2018-07-17 DIAGNOSIS — N186 End stage renal disease: Secondary | ICD-10-CM | POA: Diagnosis not present

## 2018-07-17 DIAGNOSIS — D631 Anemia in chronic kidney disease: Secondary | ICD-10-CM | POA: Diagnosis not present

## 2018-07-17 DIAGNOSIS — N2581 Secondary hyperparathyroidism of renal origin: Secondary | ICD-10-CM | POA: Diagnosis not present

## 2018-07-19 DIAGNOSIS — E118 Type 2 diabetes mellitus with unspecified complications: Secondary | ICD-10-CM | POA: Diagnosis not present

## 2018-07-19 DIAGNOSIS — D631 Anemia in chronic kidney disease: Secondary | ICD-10-CM | POA: Diagnosis not present

## 2018-07-19 DIAGNOSIS — N2581 Secondary hyperparathyroidism of renal origin: Secondary | ICD-10-CM | POA: Diagnosis not present

## 2018-07-19 DIAGNOSIS — N186 End stage renal disease: Secondary | ICD-10-CM | POA: Diagnosis not present

## 2018-07-22 DIAGNOSIS — N186 End stage renal disease: Secondary | ICD-10-CM | POA: Diagnosis not present

## 2018-07-22 DIAGNOSIS — D631 Anemia in chronic kidney disease: Secondary | ICD-10-CM | POA: Diagnosis not present

## 2018-07-22 DIAGNOSIS — N2581 Secondary hyperparathyroidism of renal origin: Secondary | ICD-10-CM | POA: Diagnosis not present

## 2018-07-22 DIAGNOSIS — E118 Type 2 diabetes mellitus with unspecified complications: Secondary | ICD-10-CM | POA: Diagnosis not present

## 2018-07-23 ENCOUNTER — Other Ambulatory Visit: Payer: Self-pay | Admitting: Internal Medicine

## 2018-07-23 ENCOUNTER — Ambulatory Visit
Admission: RE | Admit: 2018-07-23 | Discharge: 2018-07-23 | Disposition: A | Discharge status: Home / Self Care | Payer: Medicare Other | Source: Ambulatory Visit | Attending: Internal Medicine | Admitting: Internal Medicine

## 2018-07-23 DIAGNOSIS — Z1231 Encounter for screening mammogram for malignant neoplasm of breast: Secondary | ICD-10-CM | POA: Diagnosis not present

## 2018-07-24 DIAGNOSIS — N2581 Secondary hyperparathyroidism of renal origin: Secondary | ICD-10-CM | POA: Diagnosis not present

## 2018-07-24 DIAGNOSIS — D631 Anemia in chronic kidney disease: Secondary | ICD-10-CM | POA: Diagnosis not present

## 2018-07-24 DIAGNOSIS — E118 Type 2 diabetes mellitus with unspecified complications: Secondary | ICD-10-CM | POA: Diagnosis not present

## 2018-07-24 DIAGNOSIS — N186 End stage renal disease: Secondary | ICD-10-CM | POA: Diagnosis not present

## 2018-07-26 DIAGNOSIS — N186 End stage renal disease: Secondary | ICD-10-CM | POA: Diagnosis not present

## 2018-07-26 DIAGNOSIS — E118 Type 2 diabetes mellitus with unspecified complications: Secondary | ICD-10-CM | POA: Diagnosis not present

## 2018-07-26 DIAGNOSIS — N2581 Secondary hyperparathyroidism of renal origin: Secondary | ICD-10-CM | POA: Diagnosis not present

## 2018-07-26 DIAGNOSIS — D631 Anemia in chronic kidney disease: Secondary | ICD-10-CM | POA: Diagnosis not present

## 2018-07-28 DIAGNOSIS — E1129 Type 2 diabetes mellitus with other diabetic kidney complication: Secondary | ICD-10-CM | POA: Diagnosis not present

## 2018-07-28 DIAGNOSIS — N186 End stage renal disease: Secondary | ICD-10-CM | POA: Diagnosis not present

## 2018-07-28 DIAGNOSIS — Z992 Dependence on renal dialysis: Secondary | ICD-10-CM | POA: Diagnosis not present

## 2018-07-29 DIAGNOSIS — Z23 Encounter for immunization: Secondary | ICD-10-CM | POA: Diagnosis not present

## 2018-07-29 DIAGNOSIS — N186 End stage renal disease: Secondary | ICD-10-CM | POA: Diagnosis not present

## 2018-07-29 DIAGNOSIS — E8770 Fluid overload, unspecified: Secondary | ICD-10-CM | POA: Diagnosis not present

## 2018-07-29 DIAGNOSIS — N2581 Secondary hyperparathyroidism of renal origin: Secondary | ICD-10-CM | POA: Diagnosis not present

## 2018-07-29 DIAGNOSIS — D631 Anemia in chronic kidney disease: Secondary | ICD-10-CM | POA: Diagnosis not present

## 2018-07-31 DIAGNOSIS — D631 Anemia in chronic kidney disease: Secondary | ICD-10-CM | POA: Diagnosis not present

## 2018-07-31 DIAGNOSIS — Z23 Encounter for immunization: Secondary | ICD-10-CM | POA: Diagnosis not present

## 2018-07-31 DIAGNOSIS — N186 End stage renal disease: Secondary | ICD-10-CM | POA: Diagnosis not present

## 2018-07-31 DIAGNOSIS — E8770 Fluid overload, unspecified: Secondary | ICD-10-CM | POA: Diagnosis not present

## 2018-07-31 DIAGNOSIS — N2581 Secondary hyperparathyroidism of renal origin: Secondary | ICD-10-CM | POA: Diagnosis not present

## 2018-08-02 DIAGNOSIS — N186 End stage renal disease: Secondary | ICD-10-CM | POA: Diagnosis not present

## 2018-08-02 DIAGNOSIS — Z23 Encounter for immunization: Secondary | ICD-10-CM | POA: Diagnosis not present

## 2018-08-02 DIAGNOSIS — N2581 Secondary hyperparathyroidism of renal origin: Secondary | ICD-10-CM | POA: Diagnosis not present

## 2018-08-02 DIAGNOSIS — D631 Anemia in chronic kidney disease: Secondary | ICD-10-CM | POA: Diagnosis not present

## 2018-08-02 DIAGNOSIS — E8770 Fluid overload, unspecified: Secondary | ICD-10-CM | POA: Diagnosis not present

## 2018-08-05 DIAGNOSIS — N2581 Secondary hyperparathyroidism of renal origin: Secondary | ICD-10-CM | POA: Diagnosis not present

## 2018-08-05 DIAGNOSIS — D631 Anemia in chronic kidney disease: Secondary | ICD-10-CM | POA: Diagnosis not present

## 2018-08-05 DIAGNOSIS — E8770 Fluid overload, unspecified: Secondary | ICD-10-CM | POA: Diagnosis not present

## 2018-08-05 DIAGNOSIS — Z23 Encounter for immunization: Secondary | ICD-10-CM | POA: Diagnosis not present

## 2018-08-05 DIAGNOSIS — N186 End stage renal disease: Secondary | ICD-10-CM | POA: Diagnosis not present

## 2018-08-07 DIAGNOSIS — D631 Anemia in chronic kidney disease: Secondary | ICD-10-CM | POA: Diagnosis not present

## 2018-08-07 DIAGNOSIS — Z23 Encounter for immunization: Secondary | ICD-10-CM | POA: Diagnosis not present

## 2018-08-07 DIAGNOSIS — E8770 Fluid overload, unspecified: Secondary | ICD-10-CM | POA: Diagnosis not present

## 2018-08-07 DIAGNOSIS — N186 End stage renal disease: Secondary | ICD-10-CM | POA: Diagnosis not present

## 2018-08-07 DIAGNOSIS — N2581 Secondary hyperparathyroidism of renal origin: Secondary | ICD-10-CM | POA: Diagnosis not present

## 2018-08-09 DIAGNOSIS — N186 End stage renal disease: Secondary | ICD-10-CM | POA: Diagnosis not present

## 2018-08-09 DIAGNOSIS — Z23 Encounter for immunization: Secondary | ICD-10-CM | POA: Diagnosis not present

## 2018-08-09 DIAGNOSIS — E8770 Fluid overload, unspecified: Secondary | ICD-10-CM | POA: Diagnosis not present

## 2018-08-09 DIAGNOSIS — N2581 Secondary hyperparathyroidism of renal origin: Secondary | ICD-10-CM | POA: Diagnosis not present

## 2018-08-09 DIAGNOSIS — D631 Anemia in chronic kidney disease: Secondary | ICD-10-CM | POA: Diagnosis not present

## 2018-08-12 DIAGNOSIS — D631 Anemia in chronic kidney disease: Secondary | ICD-10-CM | POA: Diagnosis not present

## 2018-08-12 DIAGNOSIS — E8770 Fluid overload, unspecified: Secondary | ICD-10-CM | POA: Diagnosis not present

## 2018-08-12 DIAGNOSIS — N186 End stage renal disease: Secondary | ICD-10-CM | POA: Diagnosis not present

## 2018-08-12 DIAGNOSIS — N2581 Secondary hyperparathyroidism of renal origin: Secondary | ICD-10-CM | POA: Diagnosis not present

## 2018-08-12 DIAGNOSIS — Z23 Encounter for immunization: Secondary | ICD-10-CM | POA: Diagnosis not present

## 2018-08-14 DIAGNOSIS — D631 Anemia in chronic kidney disease: Secondary | ICD-10-CM | POA: Diagnosis not present

## 2018-08-14 DIAGNOSIS — Z23 Encounter for immunization: Secondary | ICD-10-CM | POA: Diagnosis not present

## 2018-08-14 DIAGNOSIS — E8770 Fluid overload, unspecified: Secondary | ICD-10-CM | POA: Diagnosis not present

## 2018-08-14 DIAGNOSIS — N186 End stage renal disease: Secondary | ICD-10-CM | POA: Diagnosis not present

## 2018-08-14 DIAGNOSIS — N2581 Secondary hyperparathyroidism of renal origin: Secondary | ICD-10-CM | POA: Diagnosis not present

## 2018-08-15 DIAGNOSIS — N2581 Secondary hyperparathyroidism of renal origin: Secondary | ICD-10-CM | POA: Diagnosis not present

## 2018-08-15 DIAGNOSIS — E8770 Fluid overload, unspecified: Secondary | ICD-10-CM | POA: Diagnosis not present

## 2018-08-15 DIAGNOSIS — N186 End stage renal disease: Secondary | ICD-10-CM | POA: Diagnosis not present

## 2018-08-15 DIAGNOSIS — Z23 Encounter for immunization: Secondary | ICD-10-CM | POA: Diagnosis not present

## 2018-08-15 DIAGNOSIS — D631 Anemia in chronic kidney disease: Secondary | ICD-10-CM | POA: Diagnosis not present

## 2018-08-16 DIAGNOSIS — Z23 Encounter for immunization: Secondary | ICD-10-CM | POA: Diagnosis not present

## 2018-08-16 DIAGNOSIS — N186 End stage renal disease: Secondary | ICD-10-CM | POA: Diagnosis not present

## 2018-08-16 DIAGNOSIS — E8770 Fluid overload, unspecified: Secondary | ICD-10-CM | POA: Diagnosis not present

## 2018-08-16 DIAGNOSIS — D631 Anemia in chronic kidney disease: Secondary | ICD-10-CM | POA: Diagnosis not present

## 2018-08-16 DIAGNOSIS — N2581 Secondary hyperparathyroidism of renal origin: Secondary | ICD-10-CM | POA: Diagnosis not present

## 2018-08-19 DIAGNOSIS — N186 End stage renal disease: Secondary | ICD-10-CM | POA: Diagnosis not present

## 2018-08-19 DIAGNOSIS — E8770 Fluid overload, unspecified: Secondary | ICD-10-CM | POA: Diagnosis not present

## 2018-08-19 DIAGNOSIS — Z23 Encounter for immunization: Secondary | ICD-10-CM | POA: Diagnosis not present

## 2018-08-19 DIAGNOSIS — N2581 Secondary hyperparathyroidism of renal origin: Secondary | ICD-10-CM | POA: Diagnosis not present

## 2018-08-19 DIAGNOSIS — D631 Anemia in chronic kidney disease: Secondary | ICD-10-CM | POA: Diagnosis not present

## 2018-08-21 DIAGNOSIS — E8770 Fluid overload, unspecified: Secondary | ICD-10-CM | POA: Diagnosis not present

## 2018-08-21 DIAGNOSIS — N2581 Secondary hyperparathyroidism of renal origin: Secondary | ICD-10-CM | POA: Diagnosis not present

## 2018-08-21 DIAGNOSIS — D631 Anemia in chronic kidney disease: Secondary | ICD-10-CM | POA: Diagnosis not present

## 2018-08-21 DIAGNOSIS — Z23 Encounter for immunization: Secondary | ICD-10-CM | POA: Diagnosis not present

## 2018-08-21 DIAGNOSIS — N186 End stage renal disease: Secondary | ICD-10-CM | POA: Diagnosis not present

## 2018-08-23 DIAGNOSIS — N2581 Secondary hyperparathyroidism of renal origin: Secondary | ICD-10-CM | POA: Diagnosis not present

## 2018-08-23 DIAGNOSIS — D631 Anemia in chronic kidney disease: Secondary | ICD-10-CM | POA: Diagnosis not present

## 2018-08-23 DIAGNOSIS — Z23 Encounter for immunization: Secondary | ICD-10-CM | POA: Diagnosis not present

## 2018-08-23 DIAGNOSIS — N186 End stage renal disease: Secondary | ICD-10-CM | POA: Diagnosis not present

## 2018-08-23 DIAGNOSIS — E8770 Fluid overload, unspecified: Secondary | ICD-10-CM | POA: Diagnosis not present

## 2018-08-26 DIAGNOSIS — N2581 Secondary hyperparathyroidism of renal origin: Secondary | ICD-10-CM | POA: Diagnosis not present

## 2018-08-26 DIAGNOSIS — D631 Anemia in chronic kidney disease: Secondary | ICD-10-CM | POA: Diagnosis not present

## 2018-08-26 DIAGNOSIS — Z23 Encounter for immunization: Secondary | ICD-10-CM | POA: Diagnosis not present

## 2018-08-26 DIAGNOSIS — N186 End stage renal disease: Secondary | ICD-10-CM | POA: Diagnosis not present

## 2018-08-26 DIAGNOSIS — E8770 Fluid overload, unspecified: Secondary | ICD-10-CM | POA: Diagnosis not present

## 2018-08-27 DIAGNOSIS — N186 End stage renal disease: Secondary | ICD-10-CM | POA: Diagnosis not present

## 2018-08-27 DIAGNOSIS — E1129 Type 2 diabetes mellitus with other diabetic kidney complication: Secondary | ICD-10-CM | POA: Diagnosis not present

## 2018-08-27 DIAGNOSIS — Z992 Dependence on renal dialysis: Secondary | ICD-10-CM | POA: Diagnosis not present

## 2018-08-28 DIAGNOSIS — N186 End stage renal disease: Secondary | ICD-10-CM | POA: Diagnosis not present

## 2018-08-28 DIAGNOSIS — N2581 Secondary hyperparathyroidism of renal origin: Secondary | ICD-10-CM | POA: Diagnosis not present

## 2018-08-28 DIAGNOSIS — D631 Anemia in chronic kidney disease: Secondary | ICD-10-CM | POA: Diagnosis not present

## 2018-08-28 DIAGNOSIS — E118 Type 2 diabetes mellitus with unspecified complications: Secondary | ICD-10-CM | POA: Diagnosis not present

## 2018-08-30 DIAGNOSIS — N2581 Secondary hyperparathyroidism of renal origin: Secondary | ICD-10-CM | POA: Diagnosis not present

## 2018-08-30 DIAGNOSIS — D631 Anemia in chronic kidney disease: Secondary | ICD-10-CM | POA: Diagnosis not present

## 2018-08-30 DIAGNOSIS — E118 Type 2 diabetes mellitus with unspecified complications: Secondary | ICD-10-CM | POA: Diagnosis not present

## 2018-08-30 DIAGNOSIS — N186 End stage renal disease: Secondary | ICD-10-CM | POA: Diagnosis not present

## 2018-09-02 DIAGNOSIS — D631 Anemia in chronic kidney disease: Secondary | ICD-10-CM | POA: Diagnosis not present

## 2018-09-02 DIAGNOSIS — N186 End stage renal disease: Secondary | ICD-10-CM | POA: Diagnosis not present

## 2018-09-02 DIAGNOSIS — N2581 Secondary hyperparathyroidism of renal origin: Secondary | ICD-10-CM | POA: Diagnosis not present

## 2018-09-02 DIAGNOSIS — E118 Type 2 diabetes mellitus with unspecified complications: Secondary | ICD-10-CM | POA: Diagnosis not present

## 2018-09-04 DIAGNOSIS — D631 Anemia in chronic kidney disease: Secondary | ICD-10-CM | POA: Diagnosis not present

## 2018-09-04 DIAGNOSIS — E118 Type 2 diabetes mellitus with unspecified complications: Secondary | ICD-10-CM | POA: Diagnosis not present

## 2018-09-04 DIAGNOSIS — N186 End stage renal disease: Secondary | ICD-10-CM | POA: Diagnosis not present

## 2018-09-04 DIAGNOSIS — N2581 Secondary hyperparathyroidism of renal origin: Secondary | ICD-10-CM | POA: Diagnosis not present

## 2018-09-05 DIAGNOSIS — I871 Compression of vein: Secondary | ICD-10-CM | POA: Diagnosis not present

## 2018-09-05 DIAGNOSIS — Z992 Dependence on renal dialysis: Secondary | ICD-10-CM | POA: Diagnosis not present

## 2018-09-05 DIAGNOSIS — N186 End stage renal disease: Secondary | ICD-10-CM | POA: Diagnosis not present

## 2018-09-06 DIAGNOSIS — N186 End stage renal disease: Secondary | ICD-10-CM | POA: Diagnosis not present

## 2018-09-06 DIAGNOSIS — D631 Anemia in chronic kidney disease: Secondary | ICD-10-CM | POA: Diagnosis not present

## 2018-09-06 DIAGNOSIS — E118 Type 2 diabetes mellitus with unspecified complications: Secondary | ICD-10-CM | POA: Diagnosis not present

## 2018-09-06 DIAGNOSIS — N2581 Secondary hyperparathyroidism of renal origin: Secondary | ICD-10-CM | POA: Diagnosis not present

## 2018-09-09 ENCOUNTER — Other Ambulatory Visit: Payer: Self-pay | Admitting: Physician Assistant

## 2018-09-09 DIAGNOSIS — N2581 Secondary hyperparathyroidism of renal origin: Secondary | ICD-10-CM | POA: Diagnosis not present

## 2018-09-09 DIAGNOSIS — E118 Type 2 diabetes mellitus with unspecified complications: Secondary | ICD-10-CM | POA: Diagnosis not present

## 2018-09-09 DIAGNOSIS — D631 Anemia in chronic kidney disease: Secondary | ICD-10-CM | POA: Diagnosis not present

## 2018-09-09 DIAGNOSIS — N186 End stage renal disease: Secondary | ICD-10-CM | POA: Diagnosis not present

## 2018-09-11 DIAGNOSIS — D631 Anemia in chronic kidney disease: Secondary | ICD-10-CM | POA: Diagnosis not present

## 2018-09-11 DIAGNOSIS — N186 End stage renal disease: Secondary | ICD-10-CM | POA: Diagnosis not present

## 2018-09-11 DIAGNOSIS — E118 Type 2 diabetes mellitus with unspecified complications: Secondary | ICD-10-CM | POA: Diagnosis not present

## 2018-09-11 DIAGNOSIS — N2581 Secondary hyperparathyroidism of renal origin: Secondary | ICD-10-CM | POA: Diagnosis not present

## 2018-09-13 DIAGNOSIS — N186 End stage renal disease: Secondary | ICD-10-CM | POA: Diagnosis not present

## 2018-09-13 DIAGNOSIS — E118 Type 2 diabetes mellitus with unspecified complications: Secondary | ICD-10-CM | POA: Diagnosis not present

## 2018-09-13 DIAGNOSIS — N2581 Secondary hyperparathyroidism of renal origin: Secondary | ICD-10-CM | POA: Diagnosis not present

## 2018-09-13 DIAGNOSIS — D631 Anemia in chronic kidney disease: Secondary | ICD-10-CM | POA: Diagnosis not present

## 2018-09-16 DIAGNOSIS — N2581 Secondary hyperparathyroidism of renal origin: Secondary | ICD-10-CM | POA: Diagnosis not present

## 2018-09-16 DIAGNOSIS — E118 Type 2 diabetes mellitus with unspecified complications: Secondary | ICD-10-CM | POA: Diagnosis not present

## 2018-09-16 DIAGNOSIS — N186 End stage renal disease: Secondary | ICD-10-CM | POA: Diagnosis not present

## 2018-09-16 DIAGNOSIS — D631 Anemia in chronic kidney disease: Secondary | ICD-10-CM | POA: Diagnosis not present

## 2018-09-18 DIAGNOSIS — N2581 Secondary hyperparathyroidism of renal origin: Secondary | ICD-10-CM | POA: Diagnosis not present

## 2018-09-18 DIAGNOSIS — N186 End stage renal disease: Secondary | ICD-10-CM | POA: Diagnosis not present

## 2018-09-18 DIAGNOSIS — D631 Anemia in chronic kidney disease: Secondary | ICD-10-CM | POA: Diagnosis not present

## 2018-09-18 DIAGNOSIS — E118 Type 2 diabetes mellitus with unspecified complications: Secondary | ICD-10-CM | POA: Diagnosis not present

## 2018-09-20 DIAGNOSIS — N186 End stage renal disease: Secondary | ICD-10-CM | POA: Diagnosis not present

## 2018-09-20 DIAGNOSIS — N2581 Secondary hyperparathyroidism of renal origin: Secondary | ICD-10-CM | POA: Diagnosis not present

## 2018-09-20 DIAGNOSIS — D631 Anemia in chronic kidney disease: Secondary | ICD-10-CM | POA: Diagnosis not present

## 2018-09-20 DIAGNOSIS — E118 Type 2 diabetes mellitus with unspecified complications: Secondary | ICD-10-CM | POA: Diagnosis not present

## 2018-09-23 DIAGNOSIS — E118 Type 2 diabetes mellitus with unspecified complications: Secondary | ICD-10-CM | POA: Diagnosis not present

## 2018-09-23 DIAGNOSIS — D631 Anemia in chronic kidney disease: Secondary | ICD-10-CM | POA: Diagnosis not present

## 2018-09-23 DIAGNOSIS — N2581 Secondary hyperparathyroidism of renal origin: Secondary | ICD-10-CM | POA: Diagnosis not present

## 2018-09-23 DIAGNOSIS — N186 End stage renal disease: Secondary | ICD-10-CM | POA: Diagnosis not present

## 2018-09-25 DIAGNOSIS — E118 Type 2 diabetes mellitus with unspecified complications: Secondary | ICD-10-CM | POA: Diagnosis not present

## 2018-09-25 DIAGNOSIS — N2581 Secondary hyperparathyroidism of renal origin: Secondary | ICD-10-CM | POA: Diagnosis not present

## 2018-09-25 DIAGNOSIS — D631 Anemia in chronic kidney disease: Secondary | ICD-10-CM | POA: Diagnosis not present

## 2018-09-25 DIAGNOSIS — N186 End stage renal disease: Secondary | ICD-10-CM | POA: Diagnosis not present

## 2018-09-27 DIAGNOSIS — Z992 Dependence on renal dialysis: Secondary | ICD-10-CM | POA: Diagnosis not present

## 2018-09-27 DIAGNOSIS — I871 Compression of vein: Secondary | ICD-10-CM | POA: Diagnosis not present

## 2018-09-27 DIAGNOSIS — D631 Anemia in chronic kidney disease: Secondary | ICD-10-CM | POA: Diagnosis not present

## 2018-09-27 DIAGNOSIS — E118 Type 2 diabetes mellitus with unspecified complications: Secondary | ICD-10-CM | POA: Diagnosis not present

## 2018-09-27 DIAGNOSIS — N186 End stage renal disease: Secondary | ICD-10-CM | POA: Diagnosis not present

## 2018-09-27 DIAGNOSIS — N2581 Secondary hyperparathyroidism of renal origin: Secondary | ICD-10-CM | POA: Diagnosis not present

## 2018-09-30 DIAGNOSIS — D631 Anemia in chronic kidney disease: Secondary | ICD-10-CM | POA: Diagnosis not present

## 2018-09-30 DIAGNOSIS — N186 End stage renal disease: Secondary | ICD-10-CM | POA: Diagnosis not present

## 2018-09-30 DIAGNOSIS — E118 Type 2 diabetes mellitus with unspecified complications: Secondary | ICD-10-CM | POA: Diagnosis not present

## 2018-09-30 DIAGNOSIS — N2581 Secondary hyperparathyroidism of renal origin: Secondary | ICD-10-CM | POA: Diagnosis not present

## 2018-10-02 DIAGNOSIS — N2581 Secondary hyperparathyroidism of renal origin: Secondary | ICD-10-CM | POA: Diagnosis not present

## 2018-10-02 DIAGNOSIS — N186 End stage renal disease: Secondary | ICD-10-CM | POA: Diagnosis not present

## 2018-10-02 DIAGNOSIS — D631 Anemia in chronic kidney disease: Secondary | ICD-10-CM | POA: Diagnosis not present

## 2018-10-02 DIAGNOSIS — E118 Type 2 diabetes mellitus with unspecified complications: Secondary | ICD-10-CM | POA: Diagnosis not present

## 2018-10-03 NOTE — Progress Notes (Signed)
HPI: Follow-up hypertension, diastolic heart failure and previous mitral valve repair. Renal dopplers Nov 2013 showed no RAS. Note previous pericardial effusion with minoxidil. Transesophageal echocardiogram July 2016 showed normal LV function, moderate to severe left atrial enlargement, small pericardial effusion, flail segment of anterior mitral valve leaflet with severe mitral regurgitation and mild tricuspid regurgitation. Cardiac catheterization September 2016 showed nonobstructive coronary disease and severely elevated pulmonary pressures. Carotid Dopplers August 2016 showed 1-39% bilateral stenosis. Patient had mitral valve repair in August 2016. Patient's course was complicated by acute hypoxic respiratory failure refractory to conventional ventilatory support. She was transferred to [facility] for ECMO. Course was complicated by need for tracheostomy. She also developed acute on chronic renal failure and now requires dialysis. Echocardiogram September 2017 showed normal LV function, mild diastolic dysfunction, mean gradient through mitral valve 5 mmHg, mild left atrial enlargement and mildly reduced RV function. Stress echocardiogram at [facility] on 12/12/2016 normal. Since last seen,  there is no dyspnea, chest pain, palpitations or syncope.  Current Outpatient Medications  Medication Sig Dispense Refill  . acetaminophen (TYLENOL) 500 MG tablet Take 1,000 mg by mouth every 6 (six) hours as needed (for pain.).    Marland Kitchen aspirin EC 81 MG tablet Take 81 mg by mouth daily.    Marland Kitchen azelastine (ASTELIN) 0.1 % nasal spray Use 1 to 2 sprays each nostril 2 to 3 x / day 90 mL 3  . busPIRone (BUSPAR) 10 MG tablet Take 1 tablet (10 mg total) by mouth 2 (two) times daily. 180 tablet 1  . carvedilol (COREG) 6.25 MG tablet Take 1 tablet (6.25 mg total) by mouth 2 (two) times daily. 180 tablet 1  . cinacalcet (SENSIPAR) 60 MG tablet Take 1 tablet (60 mg total) by mouth daily. 90 tablet 1  . insulin aspart protamine -  aspart (NOVOLOG MIX 70/30 FLEXPEN) (70-30) 100 UNIT/ML FlexPen Inject 0.1 mLs (10 Units total) into the skin 2 (two) times daily. (Patient taking differently: Inject 10 Units into the skin 2 (two) times daily as needed (if blood sugar is above 180). ) 15 mL 3  . latanoprost (XALATAN) 0.005 % ophthalmic solution Place 1 drop into both eyes daily.    Marland Kitchen RENVELA 800 MG tablet Take 1,600-4,000 mg by mouth 3 (three) times daily. 4000 mg (5 caps) with meals and 1600 mg (2 caps) with snacks  5  . rosuvastatin (CRESTOR) 20 MG tablet TAKE 1 TABLET BY MOUTH ONCE DAILY 90 tablet 1  . sertraline (ZOLOFT) 25 MG tablet TAKE 1 TABLET BY MOUTH ONCE DAILY 90 tablet 1   No current facility-administered medications for this visit.      Past Medical History:  Diagnosis Date  . Anemia   . Chronic diastolic CHF (congestive heart failure) ([place])   . Chronic diastolic heart failure ([place])   . CKD (chronic kidney disease) stage 4, GFR 15-29 ml/min (HCC)    dialysis M/W/F  . Constipation   . CVA (cerebral infarction) 1997   no residual deficit  . Diverticulitis   . DM (diabetes mellitus) ([place])    type 2  . Heart murmur   . History of cardiovascular stress test    a. Myoview Oct 2012 showed EF 49%, no ischemia, LVE  . HLD (hyperlipidemia)    takes Crestor daily  . Hypertension   . Pericardial effusion    chronic; felt to be poss related to minoxidil >> DC'd  . Pulmonary hypertension ([place]) 06/18/2015  . Respiratory failure, acute hypoxic,  post-operative 07/08/2015   Requiring ECMO support  . S/P cardiac catheterization    a. R/L HC 06/18/15:  mLAD 30%; severe pulmo HTN with PA sat 43%, CI 1.86, prominent V waves indicative of MR; resting hypoxemia O2 sat 86% on RA  . S/P minimally invasive mitral valve repair 07/08/2015   Complex valvuloplasty including artificial Gore-tex neochord placement x10 and 26 mm Sorin Memo 3D Rechord ring annuloplasty via right mini thoracotomy approach  . Severe mitral regurgitation     . Shortness of breath dyspnea   . Stroke ([place]) 1997   no residual effect  . Vitamin D deficiency   . Wears glasses     Past Surgical History:  Procedure Laterality Date  . A/V FISTULAGRAM Right 08/16/2017   Procedure: A/V Fistulagram;  Surgeon: Conrad Carbondale, MD;  Location: [place] [facility];  Service: Cardiovascular;  Laterality: Right;  . AV FISTULA PLACEMENT Left 10/22/2015   Procedure: RADIOCEPHALIC ARTERIOVENOUS (AV) FISTULA CREATION;  Surgeon: Serafina Mitchell, MD;  Location: [place];  Service: Vascular;  Laterality: Left;  . AV FISTULA PLACEMENT Right 05/22/2017   Procedure: ARTERIOVENOUS (AV) FISTULA CREATION-RIGHT ARM;  Surgeon: Waynetta Sandy, MD;  Location: [place];  Service: Vascular;  Laterality: Right;  . CANNULATION FOR CARDIOPULMONARY BYPASS N/A 07/08/2015   Procedure: CANNULATION FOR ECMO;  Surgeon: Rexene Alberts, MD;  Location: [place];  Service: Open Heart Surgery;  Laterality: N/A;  . CARDIAC CATHETERIZATION    . CARDIAC CATHETERIZATION N/A 06/18/2015   Procedure: Right/Left Heart Cath and Coronary Angiography;  Surgeon: Jettie Booze, MD;  Location: [place] [facility];  Service: Cardiovascular;  Laterality: N/A;  . CESAREAN SECTION     x 2  . COLONOSCOPY    . EXCHANGE OF A DIALYSIS CATHETER N/A 06/28/2017   Procedure: EXCHANGE OF A DIALYSIS CATHETER;  Surgeon: Waynetta Sandy, MD;  Location: [place];  Service: Vascular;  Laterality: N/A;  . FISTULA SUPERFICIALIZATION Left 12/28/2015   Procedure: SUPERFICIALIZATION LEFT RADIOCEPHALIC FISTULA;  Surgeon: Serafina Mitchell, MD;  Location: [place];  Service: Vascular;  Laterality: Left;  . FISTULA SUPERFICIALIZATION Right 08/21/2017   Procedure: FISTULA SUPERFICIALIZATION RIGHT ARM;  Surgeon: Waynetta Sandy, MD;  Location: [place];  Service: Vascular;  Laterality: Right;  . INSERTION OF DIALYSIS CATHETER Right 04/28/2017   Procedure: INSERTION OF DIALYSIS CATHETER-RIGHT INTERNAL JUGULAR PLACEMENT;   Surgeon: Elam Dutch, MD;  Location: [place];  Service: Vascular;  Laterality: Right;  . LIGATION OF ARTERIOVENOUS  FISTULA Left 04/28/2017   Procedure: LIGATION OF ARTERIOVENOUS  FISTULA;  Surgeon: Elam Dutch, MD;  Location: [place];  Service: Vascular;  Laterality: Left;  . MITRAL VALVE REPAIR Right 07/08/2015   Procedure: MINIMALLY INVASIVE MITRAL VALVE REPAIR with a 26 Sorin Memo 3D Rechord;  Surgeon: Rexene Alberts, MD;  Location: [place];  Service: Open Heart Surgery;  Laterality: Right;  . MULTIPLE EXTRACTIONS WITH ALVEOLOPLASTY N/A 06/30/2015   Procedure: MULTIPLE EXTRACTIONS OF TOOTH #'S 4  AND 30 WITH ALVEOLOPLASTY AND GROSS DEBRIDEMENT  OF REMAINING TEETH;  Surgeon: Lenn Cal, DDS;  Location: [place];  Service: Oral Surgery;  Laterality: N/A;  . PERIPHERAL VASCULAR CATHETERIZATION Left 03/28/2016   Procedure: A/V Fistulagram;  Surgeon: Serafina Mitchell, MD;  Location: [place] [facility];  Service: Cardiovascular;  Laterality: Left;  lower arm  . TEE WITHOUT CARDIOVERSION N/A 06/08/2015   Procedure: TRANSESOPHAGEAL ECHOCARDIOGRAM (TEE);  Surgeon: Lelon Perla, MD;  Location: [facility]  ENDOSCOPY;  Service: Cardiovascular;  Laterality: N/A;  . TEE WITHOUT CARDIOVERSION N/A 07/08/2015   Procedure: TRANSESOPHAGEAL ECHOCARDIOGRAM (TEE);  Surgeon: Rexene Alberts, MD;  Location: [place];  Service: Open Heart Surgery;  Laterality: N/A;    Social History   Socioeconomic History  . Marital status: Married    Spouse name: Not on file  . Number of children: 3  . Years of education: Not on file  . Highest education level: Not on file  Occupational History    Comment: Unemployed; used to work as a Radio broadcast assistant  Social Needs  . Financial resource strain: Not on file  . Food insecurity:    Worry: Not on file    Inability: Not on file  . Transportation needs:    Medical: Not on file    Non-medical: Not on file  Tobacco Use  . Smoking status: Never Smoker  . Smokeless tobacco:  Never Used  Substance and Sexual Activity  . Alcohol use: No    Alcohol/week: 0.0 standard drinks  . Drug use: No  . Sexual activity: Not on file  Lifestyle  . Physical activity:    Days per week: Not on file    Minutes per session: Not on file  . Stress: Not on file  Relationships  . Social connections:    Talks on phone: Not on file    Gets together: Not on file    Attends religious service: Not on file    Active member of club or organization: Not on file    Attends meetings of clubs or organizations: Not on file    Relationship status: Not on file  . Intimate partner violence:    Fear of current or ex partner: Not on file    Emotionally abused: Not on file    Physically abused: Not on file    Forced sexual activity: Not on file  Other Topics Concern  . Not on file  Social History Narrative  . Not on file    Family History  Problem Relation Age of Onset  . Cancer Sister        breast cancer  . Stroke Mother   . Heart attack Brother        MI in his 32s  . Heart disease Brother        before age 76  . Breast cancer Sister     ROS: no fevers or chills, productive cough, hemoptysis, dysphasia, odynophagia, melena, hematochezia, dysuria, hematuria, rash, seizure activity, orthopnea, PND, pedal edema, claudication. Remaining systems are negative.  Physical Exam: Well-developed well-nourished in no acute distress.  Skin is warm and dry.  HEENT is normal.  Neck is supple.  Chest is clear to auscultation with normal expansion.  Cardiovascular exam is regular rate and rhythm.  Abdominal exam nontender or distended. No masses palpated. Extremities show no edema. neuro grossly intact  A/P  1 prior mitral valve repair-plan to continue SBE prophylaxis.  2 chronic diastolic congestive heart failure-her volume is managed with dialysis.  3 history of cardiomyopathy-last echocardiogram showed normalization of LV function.  Continue low-dose beta-blocker.  Not on  hydralazine/nitrates or ACE inhibitor as blood pressure has been low previously.  4 hyperlipidemia-continue statin.  5 hypertension-continue Coreg and follow.  6 end-stage renal disease-Per nephrology.  7 coronary artery disease-no chest pain.  Continue aspirin and statin.  [name], MD

## 2018-10-04 DIAGNOSIS — N2581 Secondary hyperparathyroidism of renal origin: Secondary | ICD-10-CM | POA: Diagnosis not present

## 2018-10-04 DIAGNOSIS — D631 Anemia in chronic kidney disease: Secondary | ICD-10-CM | POA: Diagnosis not present

## 2018-10-04 DIAGNOSIS — E118 Type 2 diabetes mellitus with unspecified complications: Secondary | ICD-10-CM | POA: Diagnosis not present

## 2018-10-04 DIAGNOSIS — N186 End stage renal disease: Secondary | ICD-10-CM | POA: Diagnosis not present

## 2018-10-07 ENCOUNTER — Encounter: Payer: Self-pay | Admitting: Internal Medicine

## 2018-10-07 DIAGNOSIS — E118 Type 2 diabetes mellitus with unspecified complications: Secondary | ICD-10-CM | POA: Diagnosis not present

## 2018-10-07 DIAGNOSIS — N186 End stage renal disease: Secondary | ICD-10-CM | POA: Diagnosis not present

## 2018-10-07 DIAGNOSIS — N2581 Secondary hyperparathyroidism of renal origin: Secondary | ICD-10-CM | POA: Diagnosis not present

## 2018-10-07 DIAGNOSIS — D631 Anemia in chronic kidney disease: Secondary | ICD-10-CM | POA: Diagnosis not present

## 2018-10-07 NOTE — Progress Notes (Signed)
[facility] Unk Pinto, M.D.     Uvaldo Bristle. Silverio Lay, P.A.-C Liane Comber, [facility] [address] [place] [address] Telephone [phone] Telefax [phone]  Comprehensive Evaluation &  Examination     This very nice 62 y.o. [name] presents for a  comprehensive evaluation and management of multiple medical co-morbidities.  Patient has been followed for HTN, HLD, T2_DM / ESRD and Vitamin D Deficiency.      HTN predates circa 1999.   In 2016, she underwent an elective AoVRw/Anesthesia complications with consequent multi systems failure then transferred to [facility] for ECMO  & Dialysis. Since return home, she has been on Dialysis treatments 3 x/week. She is hopeful for a Kidney transplant. She just had an evaluation at [facility] in consideration for a transplant.  Patient's BP has been controlled at home and patient denies any cardiac symptoms as chest pain, palpitations, shortness of breath, dizziness or ankle swelling. Today's BP is at goal -  138/76.      Patient's hyperlipidemia is controlled with diet and medications. Patient denies myalgias or other medication SE's. Last lipids were at goal, albeit elevated Triglycerides: Lab Results  Component Value Date   CHOL 167 07/04/2018   HDL 42 (L) 07/04/2018   LDLCALC 96 07/04/2018   TRIG 196 (H) 07/04/2018   CHOLHDL 4.0 07/04/2018      Patient has hx/o Morbid Obesity  (BMI 39+) and T2_NIDDM predating circa 2006  and with failing renal functions her insulin requirement also decreased and she has remained off insulin. She monitors FBG's about 3 x /week - usuallu about 180 before she covers with SS Insulin.  Dr Joelyn Oms follows her ESRD / Dialyses.  She  denies reactive hypoglycemic symptoms, visual blurring, diabetic polys or paresthesias. She is advised that her target FBG should be less than 130  Mg%. Last A1c was not at goal: Lab Results  Component Value  Date   HGBA1C 7.2 (H) 07/04/2018      Finally, patient has history of Vitamin D Deficiency ("12"/  2008) and last Vitamin D was still very low: Lab Results  Component Value Date   VD25OH 32 03/26/2018   Current Outpatient Medications on File Prior to Visit  Medication Sig  . acetaminophen (TYLENOL) 500 MG tablet Take 1,000 mg by mouth every 6 (six) hours as needed (for pain.).  Marland Kitchen aspirin EC 81 MG tablet Take 81 mg by mouth daily.  Marland Kitchen azelastine (ASTELIN) 0.1 % nasal spray Use 1 to 2 sprays each nostril 2 to 3 x / day  . busPIRone (BUSPAR) 10 MG tablet Take 1 tablet (10 mg total) by mouth 2 (two) times daily.  . carvedilol (COREG) 6.25 MG tablet Take 1 tablet (6.25 mg total) by mouth 2 (two) times daily.  . cinacalcet (SENSIPAR) 60 MG tablet Take 1 tablet (60 mg total) by mouth daily.  . insulin aspart protamine - aspart (NOVOLOG MIX 70/30 FLEXPEN) (70-30) 100 UNIT/ML FlexPen Inject 0.1 mLs (10 Units total) into the skin 2 (two) times daily. (Patient taking differently: Inject 10 Units into the skin 2 (two) times daily as needed (if blood sugar is above 180). )  . RENVELA 800 MG tablet Take 1,600-4,000 mg by mouth 3 (three) times daily. 4000 mg (5 caps) with meals and 1600 mg (2 caps) with snacks  . rosuvastatin (CRESTOR) 20 MG tablet TAKE 1 TABLET BY MOUTH ONCE DAILY  . sertraline (ZOLOFT) 25 MG tablet TAKE  1 TABLET BY MOUTH ONCE DAILY   No current facility-administered medications on file prior to visit.    Allergies  Allergen Reactions  . Minoxidil Other (See Comments)    Pericardial effusion  . Lipitor [Atorvastatin] Other (See Comments)    MYALGIAS > "pain in legs" Tolerates rosuvastatin  . Ace Inhibitors Other (See Comments)    REACTION: "not sure...think it made me drowsy all the time"   Past Medical History:  Diagnosis Date  . Anemia   . Chronic diastolic CHF (congestive heart failure) ([place])   . Chronic diastolic heart failure ([place])   . CKD (chronic kidney disease) stage  4, GFR 15-29 ml/min (HCC)    dialysis M/W/F  . Constipation   . CVA (cerebral infarction) 1997   no residual deficit  . Diverticulitis   . DM (diabetes mellitus) ([place])    type 2  . Heart murmur   . History of cardiovascular stress test    a. Myoview Oct 2012 showed EF 49%, no ischemia, LVE  . HLD (hyperlipidemia)    takes Crestor daily  . Hypertension   . Pericardial effusion    chronic; felt to be poss related to minoxidil >> DC'd  . Pulmonary hypertension ([place]) 06/18/2015  . Respiratory failure, acute hypoxic, post-operative 07/08/2015   Requiring ECMO support  . S/P cardiac catheterization    a. R/L HC 06/18/15:  mLAD 30%; severe pulmo HTN with PA sat 43%, CI 1.86, prominent V waves indicative of MR; resting hypoxemia O2 sat 86% on RA  . S/P minimally invasive mitral valve repair 07/08/2015   Complex valvuloplasty including artificial Gore-tex neochord placement x10 and 26 mm Sorin Memo 3D Rechord ring annuloplasty via right mini thoracotomy approach  . Severe mitral regurgitation   . Shortness of breath dyspnea   . Stroke ([place]) 1997   no residual effect  . Vitamin D deficiency   . Wears glasses    Health Maintenance  Topic Date Due  . URINE MICROALBUMIN  03/28/2016  . OPHTHALMOLOGY EXAM  11/26/2018  . TETANUS/TDAP  12/28/2018  . HEMOGLOBIN A1C  01/04/2019  . FOOT EXAM  10/08/2019  . PAP SMEAR  12/08/2019  . MAMMOGRAM  07/23/2020  . COLONOSCOPY  01/18/2022  . INFLUENZA VACCINE  Completed  . PNEUMOCOCCAL POLYSACCHARIDE VACCINE AGE 21-64 HIGH RISK  Completed  . Hepatitis C Screening  Completed  . HIV Screening  Completed   Immunization History  Administered Date(s) Administered  . Influenza Split 08/20/2012, 08/15/2017  . PPD Test 03/25/2014, 03/29/2015  . Pneumococcal Polysaccharide-23 09/11/2017  . Pneumococcal-Unspecified 07/28/2008  . Tdap 12/28/2008   Last Colon - 01/19/2012 - Dr Collene Mares - Recc 10 yr f/u due Feb 2023  Last MGM - 07/23/2018  Past Surgical  History:  Procedure Laterality Date  . A/V FISTULAGRAM Right 08/16/2017   Procedure: A/V Fistulagram;  Surgeon: Conrad [place], MD;  Location: [facility];  Service: Cardiovascular;  Laterality: Right;  . AV FISTULA PLACEMENT Left 10/22/2015   Procedure: RADIOCEPHALIC ARTERIOVENOUS (AV) FISTULA CREATION;  Surgeon: Serafina Mitchell, MD;  Location: [facility];  Service: Vascular;  Laterality: Left;  . AV FISTULA PLACEMENT Right 05/22/2017   Procedure: ARTERIOVENOUS (AV) FISTULA CREATION-RIGHT ARM;  Surgeon: Waynetta Sandy, MD;  Location: [facility];  Service: Vascular;  Laterality: Right;  . CANNULATION FOR CARDIOPULMONARY BYPASS N/A 07/08/2015   Procedure: CANNULATION FOR ECMO;  Surgeon: Rexene Alberts, MD;  Location: [place];  Service: Open Heart Surgery;  Laterality: N/A;  .  CARDIAC CATHETERIZATION    . CARDIAC CATHETERIZATION N/A 06/18/2015   Procedure: Right/Left Heart Cath and Coronary Angiography;  Surgeon: Jettie Booze, MD;  Location: [facility];  Service: Cardiovascular;  Laterality: N/A;  . CESAREAN SECTION     x 2  . COLONOSCOPY    . EXCHANGE OF A DIALYSIS CATHETER N/A 06/28/2017   Procedure: EXCHANGE OF A DIALYSIS CATHETER;  Surgeon: Waynetta Sandy, MD;  Location: [facility];  Service: Vascular;  Laterality: N/A;  . FISTULA SUPERFICIALIZATION Left 12/28/2015   Procedure: SUPERFICIALIZATION LEFT RADIOCEPHALIC FISTULA;  Surgeon: Serafina Mitchell, MD;  Location: [facility];  Service: Vascular;  Laterality: Left;  . FISTULA SUPERFICIALIZATION Right 08/21/2017   Procedure: FISTULA SUPERFICIALIZATION RIGHT ARM;  Surgeon: Waynetta Sandy, MD;  Location: [facility];  Service: Vascular;  Laterality: Right;  . INSERTION OF DIALYSIS CATHETER Right 04/28/2017   Procedure: INSERTION OF DIALYSIS CATHETER-RIGHT INTERNAL JUGULAR PLACEMENT;  Surgeon: Elam Dutch, MD;  Location: [place];  Service: Vascular;  Laterality: Right;  . LIGATION OF ARTERIOVENOUS  FISTULA Left 04/28/2017    Procedure: LIGATION OF ARTERIOVENOUS  FISTULA;  Surgeon: Elam Dutch, MD;  Location: [facility];  Service: Vascular;  Laterality: Left;  . MITRAL VALVE REPAIR Right 07/08/2015   Procedure: MINIMALLY INVASIVE MITRAL VALVE REPAIR with a 26 Sorin Memo 3D Rechord;  Surgeon: Rexene Alberts, MD;  Location: [facility];  Service: Open Heart Surgery;  Laterality: Right;  . MULTIPLE EXTRACTIONS WITH ALVEOLOPLASTY N/A 06/30/2015   Procedure: MULTIPLE EXTRACTIONS OF TOOTH #'S 4  AND 30 WITH ALVEOLOPLASTY AND GROSS DEBRIDEMENT  OF REMAINING TEETH;  Surgeon: Lenn Cal, DDS;  Location: [facility];  Service: Oral Surgery;  Laterality: N/A;  . PERIPHERAL VASCULAR CATHETERIZATION Left 03/28/2016   Procedure: A/V Fistulagram;  Surgeon: Serafina Mitchell, MD;  Location: [facility];  Service: Cardiovascular;  Laterality: Left;  lower arm  . TEE WITHOUT CARDIOVERSION N/A 06/08/2015   Procedure: TRANSESOPHAGEAL ECHOCARDIOGRAM (TEE);  Surgeon: Lelon Perla, MD;  Location[facility];  Service: Cardiovascular;  Laterality: N/A;  . TEE WITHOUT CARDIOVERSION N/A 07/08/2015   Procedure: TRANSESOPHAGEAL ECHOCARDIOGRAM (TEE);  Surgeon: Rexene Alberts, MD;  Location: [facility];  Service: Open Heart Surgery;  Laterality: N/A;   Family History  Problem Relation Age of Onset  . Cancer Sister        breast cancer  . Stroke Mother   . Heart attack Brother        MI in his 74s  . Heart disease Brother        before age 102  . Breast cancer Sister    Social History   Tobacco Use  . Smoking status: Never Smoker  . Smokeless tobacco: Never Used  Substance Use Topics  . Alcohol use: No    Alcohol/week: 0.0 standard drinks  . Drug use: No    ROS Constitutional: Denies fever, chills, weight loss/gain, headaches, insomnia,  night sweats, and change in appetite. Does c/o fatigue. Eyes: Denies redness, blurred vision, diplopia, discharge, itchy, watery eyes.  ENT: Denies discharge, congestion, post nasal drip, epistaxis,  sore throat, earache, hearing loss, dental pain, Tinnitus, Vertigo, Sinus pain, snoring.  Cardio: Denies chest pain, palpitations, irregular heartbeat, syncope, dyspnea, diaphoresis, orthopnea, PND, claudication, edema Respiratory: denies cough, dyspnea, DOE, pleurisy, hoarseness, laryngitis, wheezing.  Gastrointestinal: Denies dysphagia, heartburn, reflux, water brash, pain, cramps, nausea, vomiting, bloating, diarrhea, constipation, hematemesis, melena, hematochezia, jaundice, hemorrhoids Genitourinary: Denies dysuria, frequency, urgency, nocturia,  hesitancy, discharge, hematuria, flank pain Breast: Breast lumps, nipple discharge, bleeding.  Musculoskeletal: Denies arthralgia, myalgia, stiffness, Jt. Swelling, pain, limp, and strain/sprain. Denies falls. Skin: Denies puritis, rash, hives, warts, acne, eczema, changing in skin lesion Neuro: No weakness, tremor, incoordination, spasms, paresthesia, pain Psychiatric: Denies confusion, memory loss, sensory loss. Denies Depression. Endocrine: Denies change in weight, skin, hair change, nocturia, and paresthesia, diabetic polys, visual blurring, hyper / hypo glycemic episodes.  Heme/Lymph: No excessive bleeding, bruising, enlarged lymph nodes.  Physical Exam  BP 138/76   Pulse 100   Temp 97.9 F (36.6 C)   Resp 14   Ht 4' 11.5" (1.511 m)   Wt 203 lb 6.4 oz (92.3 kg)   SpO2 95%   BMI 40.39 kg/m   General Appearance: Over nourished, well groomed and in no apparent distress.  Eyes: PERRLA, EOMs, conjunctiva no swelling or erythema, normal fundi and vessels. Sinuses: No frontal/maxillary tenderness ENT/Mouth: EACs patent / TMs  nl. Nares clear without erythema, swelling, mucoid exudates. Oral hygiene is good. No erythema, swelling, or exudate. Tongue normal, non-obstructing. Tonsils not swollen or erythematous. Hearing normal.  Neck: Supple, thyroid not palpable. No bruits, nodes or JVD. Respiratory: Respiratory effort normal.  BS equal and  clear bilateral without rales, rhonci, wheezing or stridor. Cardio: Heart sounds are normal with regular rate and rhythm and no murmurs, rubs or gallops. Peripheral pulses are normal and equal bilaterally without edema.Has a BrachioRadial AVF with thrill & bruit.  No aortic or femoral bruits. Chest: symmetric with normal excursions and percussion. Breasts: Symmetric, without lumps, nipple discharge, retractions, or fibrocystic changes.  Abdomen: Flat, soft with bowel sounds active. Nontender, no guarding, rebound, hernias, masses, or organomegaly.  Lymphatics: Non tender without lymphadenopathy. :  Musculoskeletal: Full ROM all peripheral extremities, joint stability, 5/5 strength, and normal gait. Skin: Warm and dry without rashes, lesions, cyanosis, clubbing or  ecchymosis.  Neuro: Cranial nerves intact, reflexes equal bilaterally. Normal muscle tone, no cerebellar symptoms. Sensation intact to touch, vibratory and Monofilament to the toes bilaterally.  Pysch: Alert and oriented X 3, normal affect, Insight and Judgment appropriate.   Assessment and Plan  1. Essential hypertension  - EKG 12-Lead - CBC with Differential/Platelet - COMPLETE METABOLIC PANEL WITH GFR - Magnesium - TSH  2. Hyperlipidemia, mixed  - EKG 12-Lead - Lipid panel - TSH  3. Type 2 diabetes mellitus with chronic kidney disease on chronic dialysis, with long-term current use of insulin ([place])  - Stressed importance of better diet & weight loss and better control of her Diabetes. And to have a lower threshold to cover her glucoses and target to less than 130 mg%.   - EKG 12-Lead - HM DIABETES FOOT EXAM - LOW EXTREMITY NEUR EXAM DOCUM - COMPLETE METABOLIC PANEL WITH GFR - Hemoglobin A1c  4. Vitamin D deficiency  - VITAMIN D 25 Hydroxyl  5. Anemia in chronic kidney disease, on chronic dialysis (HCC)  - Iron,Total/Total Iron Binding Cap - Vitamin B12 - CBC with Differential/Platelet  6. ESRD on dialysis  ([place])  - COMPLETE METABOLIC PANEL WITH GFR  7. Other vitamin B12 deficiency anemia  - Vitamin B12  8. Chronic diastolic heart failure (HCC)   9. Paroxysmal atrial fibrillation (HCC)  - EKG 12-Lead - TSH  10. Screening for colorectal cancer   11. Screening for ischemic heart disease  - EKG 12-Lead  12. FHx: heart disease  - EKG 12-Lead  13. Fatigue  - Iron,Total/Total Iron Binding Cap - Vitamin B12  14. Medication management  - EKG 12-Lead - Iron,Total/Total Iron Binding Cap - Vitamin B12 - Uric acid - HM DIABETES FOOT EXAM - LOW EXTREMITY NEUR EXAM DOCUM - CBC with Differential/Platelet - COMPLETE METABOLIC PANEL WITH GFR - Magnesium - Lipid panel - TSH - Hemoglobin A1c - VITAMIN D 25 Hydroxyl        Patient was counseled in prudent diet to achieve/maintain BMI less than 25 for weight control, BP monitoring, regular exercise and medications. Discussed med's effects and SE's. Screening labs and tests as requested with regular follow-up as recommended. Over 40 minutes of exam, counseling, chart review and high complex critical decision making was performed.

## 2018-10-07 NOTE — Patient Instructions (Signed)
Preventive Care for Adults  A healthy lifestyle and preventive care can promote health and wellness. Preventive health guidelines for women include the following key practices.  A routine yearly physical is a good way to check with your health care provider about your health and preventive screening. It is a chance to share any concerns and updates on your health and to receive a thorough exam.  Visit your dentist for a routine exam and preventive care every 6 months. Brush your teeth twice a day and floss once a day. Good oral hygiene prevents tooth decay and gum disease.  The frequency of eye exams is based on your age, health, family medical history, use of contact lenses, and other factors. Follow your health care provider's recommendations for frequency of eye exams.  Eat a healthy diet. Foods like vegetables, fruits, whole grains, low-fat dairy products, and lean protein foods contain the nutrients you need without too many calories. Decrease your intake of foods high in solid fats, added sugars, and salt. Eat the right amount of calories for you. Get information about a proper diet from your health care provider, if necessary.  Regular physical exercise is one of the most important things you can do for your health. Most adults should get at least 150 minutes of moderate-intensity exercise (any activity that increases your heart rate and causes you to sweat) each week. In addition, most adults need muscle-strengthening exercises on 2 or more days a week.  Maintain a healthy weight. The body mass index (BMI) is a screening tool to identify possible weight problems. It provides an estimate of body fat based on height and weight. Your health care provider can find your BMI and can help you achieve or maintain a healthy weight. For adults 20 years and older:  A BMI below 18.5 is considered underweight.  A BMI of 18.5 to 24.9 is normal.  A BMI of 25 to 29.9 is considered overweight.  A BMI of  30 and above is considered obese.  Maintain normal blood lipids and cholesterol levels by exercising and minimizing your intake of saturated fat. Eat a balanced diet with plenty of fruit and vegetables. Blood tests for lipids and cholesterol should begin at age 53 and be repeated every 5 years. If your lipid or cholesterol levels are high, you are over 50, or you are at high risk for heart disease, you may need your cholesterol levels checked more frequently. Ongoing high lipid and cholesterol levels should be treated with medicines if diet and exercise are not working.  If you smoke, find out from your health care provider how to quit. If you do not use tobacco, do not start.  Lung cancer screening is recommended for adults aged 48-80 years who are at high risk for developing lung cancer because of a history of smoking. A yearly low-dose CT scan of the lungs is recommended for people who have at least a 30-pack-year history of smoking and are a current smoker or have quit within the past 15 years. A pack year of smoking is smoking an average of 1 pack of cigarettes a day for 1 year (for example: 1 pack a day for 30 years or 2 packs a day for 15 years). Yearly screening should continue until the smoker has stopped smoking for at least 15 years. Yearly screening should be stopped for people who develop a health problem that would prevent them from having lung cancer treatment.  High blood pressure causes heart disease and increases  the risk of stroke. Your blood pressure should be checked at least every 1 to 2 years. Ongoing high blood pressure should be treated with medicines if weight loss and exercise do not work.  If you are 63-27 years old, ask your health care provider if you should take aspirin to prevent strokes.  Diabetes screening involves taking a blood sample to check your fasting blood sugar level. This should be done once every 3 years, after age 54, if you are within normal weight and  without risk factors for diabetes. Testing should be considered at a younger age or be carried out more frequently if you are overweight and have at least 1 risk factor for diabetes.  Breast cancer screening is essential preventive care for women. You should practice "breast self-awareness." This means understanding the normal appearance and feel of your breasts and may include breast self-examination. Any changes detected, no matter how small, should be reported to a health care provider. Women in their 26s and 30s should have a clinical breast exam (CBE) by a health care provider as part of a regular health exam every 1 to 3 years. After age 84, women should have a CBE every year. Starting at age 56, women should consider having a mammogram (breast X-ray test) every year. Women who have a family history of breast cancer should talk to their health care provider about genetic screening. Women at a high risk of breast cancer should talk to their health care providers about having an MRI and a mammogram every year.  Breast cancer gene (BRCA)-related cancer risk assessment is recommended for women who have family members with BRCA-related cancers. BRCA-related cancers include breast, ovarian, tubal, and peritoneal cancers. Having family members with these cancers may be associated with an increased risk for harmful changes (mutations) in the breast cancer genes BRCA1 and BRCA2. Results of the assessment will determine the need for genetic counseling and BRCA1 and BRCA2 testing.  Routine pelvic exams to screen for cancer are no longer recommended for nonpregnant women who are considered low risk for cancer of the pelvic organs (ovaries, uterus, and vagina) and who do not have symptoms. Ask your health care provider if a screening pelvic exam is right for you.  If you have had past treatment for cervical cancer or a condition that could lead to cancer, you need Pap tests and screening for cancer for at least 20  years after your treatment. If Pap tests have been discontinued, your risk factors (such as having a new sexual partner) need to be reassessed to determine if screening should be resumed. Some women have medical problems that increase the chance of getting cervical cancer. In these cases, your health care provider may recommend more frequent screening and Pap tests.  Colorectal cancer can be detected and often prevented. Most routine colorectal cancer screening begins at the age of 63 years and continues through age 44 years. However, your health care provider may recommend screening at an earlier age if you have risk factors for colon cancer. On a yearly basis, your health care provider may provide home test kits to check for hidden blood in the stool. Use of a small camera at the end of a tube, to directly examine the colon (sigmoidoscopy or colonoscopy), can detect the earliest forms of colorectal cancer. Talk to your health care provider about this at age 68, when routine screening begins.  Direct exam of the colon should be repeated every 5-10 years through age 44 years, unless  early forms of pre-cancerous polyps or small growths are found.  Hepatitis C blood testing is recommended for all people born from 1945 through 1965 and any individual with known risks for hepatitis C.  Pra  Osteoporosis is a disease in which the bones lose minerals and strength with aging. This can result in serious bone fractures or breaks. The risk of osteoporosis can be identified using a bone density scan. Women ages 65 years and over and women at risk for fractures or osteoporosis should discuss screening with their health care providers. Ask your health care provider whether you should take a calcium supplement or vitamin D to reduce the rate of osteoporosis.  Menopause can be associated with physical symptoms and risks. Hormone replacement therapy is available to decrease symptoms and risks. You should talk to your  health care provider about whether hormone replacement therapy is right for you.  Use sunscreen. Apply sunscreen liberally and repeatedly throughout the day. You should seek shade when your shadow is shorter than you. Protect yourself by wearing long sleeves, pants, a wide-brimmed hat, and sunglasses year round, whenever you are outdoors.  Once a month, do a whole body skin exam, using a mirror to look at the skin on your back. Tell your health care provider of new moles, moles that have irregular borders, moles that are larger than a pencil eraser, or moles that have changed in shape or color.  Stay current with required vaccines (immunizations).  Influenza vaccine. All adults should be immunized every year.  Tetanus, diphtheria, and acellular pertussis (Td, Tdap) vaccine. Pregnant women should receive 1 dose of Tdap vaccine during each pregnancy. The dose should be obtained regardless of the length of time since the last dose. Immunization is preferred during the 27th-36th week of gestation. An adult who has not previously received Tdap or who does not know her vaccine status should receive 1 dose of Tdap. This initial dose should be followed by tetanus and diphtheria toxoids (Td) booster doses every 10 years. Adults with an unknown or incomplete history of completing a 3-dose immunization series with Td-containing vaccines should begin or complete a primary immunization series including a Tdap dose. Adults should receive a Td booster every 10 years.  Varicella vaccine. An adult without evidence of immunity to varicella should receive 2 doses or a second dose if she has previously received 1 dose. Pregnant females who do not have evidence of immunity should receive the first dose after pregnancy. This first dose should be obtained before leaving the health care facility. The second dose should be obtained 4-8 weeks after the first dose.  Human papillomavirus (HPV) vaccine. Females aged 13-26 years  who have not received the vaccine previously should obtain the 3-dose series. The vaccine is not recommended for use in pregnant females. However, pregnancy testing is not needed before receiving a dose. If a female is found to be pregnant after receiving a dose, no treatment is needed. In that case, the remaining doses should be delayed until after the pregnancy. Immunization is recommended for any person with an immunocompromised condition through the age of 26 years if she did not get any or all doses earlier. During the 3-dose series, the second dose should be obtained 4-8 weeks after the first dose. The third dose should be obtained 24 weeks after the first dose and 16 weeks after the second dose.  Zoster vaccine. One dose is recommended for adults aged 60 years or older unless certain conditions are present.    Measles, mumps, and rubella (MMR) vaccine. Adults born before [birth year] generally are considered immune to measles and mumps. Adults born in [birth year] or later should have 1 or more doses of MMR vaccine unless there is a contraindication to the vaccine or there is laboratory evidence of immunity to each of the three diseases. A routine second dose of MMR vaccine should be obtained at least 28 days after the first dose for students attending postsecondary schools, health care workers, or international travelers. People who received inactivated measles vaccine or an unknown type of measles vaccine during 1963-1967 should receive 2 doses of MMR vaccine. People who received inactivated mumps vaccine or an unknown type of mumps vaccine before 1979 and are at high risk for mumps infection should consider immunization with 2 doses of MMR vaccine. For females of childbearing age, rubella immunity should be determined. If there is no evidence of immunity, females who are not pregnant should be vaccinated. If there is no evidence of immunity, females who are pregnant should delay immunization until after pregnancy.  Unvaccinated health care workers born before [birth year] who lack laboratory evidence of measles, mumps, or rubella immunity or laboratory confirmation of disease should consider measles and mumps immunization with 2 doses of MMR vaccine or rubella immunization with 1 dose of MMR vaccine.  Pneumococcal 13-valent conjugate (PCV13) vaccine. When indicated, a person who is uncertain of her immunization history and has no record of immunization should receive the PCV13 vaccine. An adult aged 42 years or older who has certain medical conditions and has not been previously immunized should receive 1 dose of PCV13 vaccine. This PCV13 should be followed with a dose of pneumococcal polysaccharide (PPSV23) vaccine. The PPSV23 vaccine dose should be obtained at least 1 or more year(s) after the dose of PCV13 vaccine. An adult aged 46 years or older who has certain medical conditions and previously received 1 or more doses of PPSV23 vaccine should receive 1 dose of PCV13. The PCV13 vaccine dose should be obtained 1 or more years after the last PPSV23 vaccine dose.    Pneumococcal polysaccharide (PPSV23) vaccine. When PCV13 is also indicated, PCV13 should be obtained first. All adults aged 58 years and older should be immunized. An adult younger than age 54 years who has certain medical conditions should be immunized. Any person who resides in a nursing home or long-term care facility should be immunized. An adult smoker should be immunized. People with an immunocompromised condition and certain other conditions should receive both PCV13 and PPSV23 vaccines. People with human immunodeficiency virus (HIV) infection should be immunized as soon as possible after diagnosis. Immunization during chemotherapy or radiation therapy should be avoided. Routine use of PPSV23 vaccine is not recommended for American Indians, [place] Natives, or people younger than 65 years unless there are medical conditions that require PPSV23 vaccine. When  indicated, people who have unknown immunization and have no record of immunization should receive PPSV23 vaccine. One-time revaccination 5 years after the first dose of PPSV23 is recommended for people aged 19-64 years who have chronic kidney failure, nephrotic syndrome, asplenia, or immunocompromised conditions. People who received 1-2 doses of PPSV23 before age 97 years should receive another dose of PPSV23 vaccine at age 79 years or later if at least 5 years have passed since the previous dose. Doses of PPSV23 are not needed for people immunized with PPSV23 at or after age 39 years.  Preventive Services / Frequency   Ages 68 to 35 years  Blood pressure check.  Lipid and cholesterol check.  Lung cancer screening. / Every year if you are aged 63-80 years and have a 30-pack-year history of smoking and currently smoke or have quit within the past 15 years. Yearly screening is stopped once you have quit smoking for at least 15 years or develop a health problem that would prevent you from having lung cancer treatment.  Clinical breast exam.** / Every year after age 50 years.   BRCA-related cancer risk assessment.** / For women who have family members with a BRCA-related cancer (breast, ovarian, tubal, or peritoneal cancers).  Mammogram.** / Every year beginning at age 37 years and continuing for as long as you are in good health. Consult with your health care provider.  Pap test.** / Every 3 years starting at age 65 years through age 67 or 22 years with a history of 3 consecutive normal Pap tests.  HPV screening.** / Every 3 years from ages 62 years through ages 99 to 25 years with a history of 3 consecutive normal Pap tests.  Fecal occult blood test (FOBT) of stool. / Every year beginning at age 41 years and continuing until age 39 years. You may not need to do this test if you get a colonoscopy every 10 years.  Flexible sigmoidoscopy or colonoscopy.** / Every 5 years for a flexible  sigmoidoscopy or every 10 years for a colonoscopy beginning at age 38 years and continuing until age 68 years.  Hepatitis C blood test.** / For all people born from [birth year] through 1965 and any individual with known risks for hepatitis C.  Skin self-exam. / Monthly.  Influenza vaccine. / Every year.  Tetanus, diphtheria, and acellular pertussis (Tdap/Td) vaccine.** / Consult your health care provider. Pregnant women should receive 1 dose of Tdap vaccine during each pregnancy. 1 dose of Td every 10 years.  Varicella vaccine.** / Consult your health care provider. Pregnant females who do not have evidence of immunity should receive the first dose after pregnancy.  Zoster vaccine.** / 1 dose for adults aged 60 years or older.  Pneumococcal 13-valent conjugate (PCV13) vaccine.** / Consult your health care provider.  Pneumococcal polysaccharide (PPSV23) vaccine.** / 1 to 2 doses if you smoke cigarettes or if you have certain conditions.  Meningococcal vaccine.** / Consult your health care provider.  Hepatitis A vaccine.** / Consult your health care provider.  Hepatitis B vaccine.** / Consult your health care provider. Screening for abdominal aortic aneurysm (AAA)  by ultrasound is recommended for people over 50 who have history of high blood pressure or who are current or former smokers. ++++++++++++++++++ Recommend Adult Low Dose Aspirin or  coated  Aspirin 81 mg daily  To reduce risk of Colon Cancer 20 %,  Skin Cancer 26 % ,  Melanoma 46%  and  Pancreatic cancer 60% +++++++++++++++++++ Vitamin D goal  is between 70-100.  Please make sure that you are taking your Vitamin D as directed.  It is very important as a natural anti-inflammatory  helping hair, skin, and nails, as well as reducing stroke and heart attack risk.  It helps your bones and helps with mood. It also decreases numerous cancer risks so please take it as directed.  Low Vit D is associated with a 200-300% higher risk  for CANCER  and 200-300% higher risk for HEART   ATTACK  &  STROKE.   .....................................Marland Kitchen It is also associated with higher death rate at younger ages,  autoimmune diseases like Rheumatoid arthritis, Lupus, Multiple Sclerosis.  Also many other serious conditions, like depression, Alzheimer's Dementia, infertility, muscle aches, fatigue, fibromyalgia - just to name a few. ++++++++++++++++++ Recommend the book "The END of DIETING" by Dr Joel Fuhrman  & the book "The END of DIABETES " by Dr Joel Fuhrman At [employer] - get book & Audio CD's    Being diabetic has a  300% increased risk for heart attack, stroke, cancer, and alzheimer- type vascular dementia. It is very important that you work harder with diet by avoiding all foods that are white. Avoid white rice (brown & wild rice is OK), white potatoes (sweetpotatoes in moderation is OK), White bread or wheat bread or anything made out of white flour like bagels, donuts, rolls, buns, biscuits, cakes, pastries, cookies, pizza crust, and pasta (made from white flour & egg whites) - vegetarian pasta or spinach or wheat pasta is OK. Multigrain breads like Arnold's or Pepper[employer], or multigrain sandwich thins or flatbreads.  Diet, exercise and weight loss can reverse and cure diabetes in the early stages.  Diet, exercise and weight loss is very important in the control and prevention of complications of diabetes which affects every system in your body, ie. Brain - dementia/stroke, eyes - glaucoma/blindness, heart - heart attack/heart failure, kidneys - dialysis, stomach - gastric paralysis, intestines - malabsorption, nerves - severe painful neuritis, circulation - gangrene & loss of a leg(s), and finally cancer and Alzheimers.    I recommend avoid fried & greasy foods,  sweets/candy, white rice (brown or wild rice or Quinoa is OK), white potatoes (sweet potatoes are OK) - anything made from white flour - bagels, doughnuts, rolls, buns,  biscuits,white and wheat breads, pizza crust and traditional pasta made of white flour & egg white(vegetarian pasta or spinach or wheat pasta is OK).  Multi-grain bread is OK - like multi-grain flat bread or sandwich thins. Avoid alcohol in excess. Exercise is also important.    Eat all the vegetables you want - avoid meat, especially red meat and dairy - especially cheese.  Cheese is the most concentrated form of trans-fats which is the worst thing to clog up our arteries. Veggie cheese is OK which can be found in the fresh produce section at [employer] or [employer] or [employer]  ++++++++++++++++++++++ DASH Eating Plan  DASH stands for "Dietary Approaches to Stop Hypertension."   The DASH eating plan is a healthy eating plan that has been shown to reduce high blood pressure (hypertension). Additional health benefits may include reducing the risk of type 2 diabetes mellitus, heart disease, and stroke. The DASH eating plan may also help with weight loss. WHAT DO I NEED TO KNOW ABOUT THE DASH EATING PLAN? For the DASH eating plan, you will follow these general guidelines:  Choose foods with a percent daily value for sodium of less than 5% (as listed on the food label).  Use salt-free seasonings or herbs instead of table salt or sea salt.  Check with your health care provider or pharmacist before using salt substitutes.  Eat lower-sodium products, often labeled as "lower sodium" or "no salt added."  Eat fresh foods.  Eat more vegetables, fruits, and low-fat dairy products.  Choose whole grains. Look for the word "whole" as the first word in the ingredient list.  Choose fish   Limit sweets, desserts, sugars, and sugary drinks.  Choose heart-healthy fats.  Eat veggie cheese   Eat more home-cooked food and less restaurant, buffet, and fast food.  Limit fried foods.  Cook foods using   methods other than frying.  Limit canned vegetables. If you do use them, rinse them well to  decrease the sodium.  When eating at a restaurant, ask that your food be prepared with less salt, or no salt if possible.                      WHAT FOODS CAN I EAT? Read Dr Fara Olden Fuhrman's books on The End of Dieting & The End of Diabetes  Grains Whole grain or whole wheat bread. Brown rice. Whole grain or whole wheat pasta. Quinoa, bulgur, and whole grain cereals. Low-sodium cereals. Corn or whole wheat flour tortillas. Whole grain cornbread. Whole grain crackers. Low-sodium crackers.  Vegetables Fresh or frozen vegetables (raw, steamed, roasted, or grilled). Low-sodium or reduced-sodium tomato and vegetable juices. Low-sodium or reduced-sodium tomato sauce and paste. Low-sodium or reduced-sodium canned vegetables.   Fruits All fresh, canned (in natural juice), or frozen fruits.  Protein Products  All fish and seafood.  Dried beans, peas, or lentils. Unsalted nuts and seeds. Unsalted canned beans.  Dairy Low-fat dairy products, such as skim or 1% milk, 2% or reduced-fat cheeses, low-fat ricotta or cottage cheese, or plain low-fat yogurt. Low-sodium or reduced-sodium cheeses.  Fats and Oils Tub margarines without trans fats. Light or reduced-fat mayonnaise and salad dressings (reduced sodium). Avocado. Safflower, olive, or canola oils. Natural peanut or almond butter.  Other Unsalted popcorn and pretzels. The items listed above may not be a complete list of recommended foods or beverages. Contact your dietitian for more options.  ++++++++++++++++++  WHAT FOODS ARE NOT RECOMMENDED? Grains/ White flour or wheat flour White bread. White pasta. White rice. Refined cornbread. Bagels and croissants. Crackers that contain trans fat.  Vegetables  Creamed or fried vegetables. Vegetables in a . Regular canned vegetables. Regular canned tomato sauce and paste. Regular tomato and vegetable juices.  Fruits Dried fruits. Canned fruit in light or heavy syrup. Fruit juice.  Meat and Other  Protein Products Meat in general - RED meat & White meat.  Fatty cuts of meat. Ribs, chicken wings, all processed meats as bacon, sausage, bologna, salami, fatback, hot dogs, bratwurst and packaged luncheon meats.  Dairy Whole or 2% milk, cream, half-and-half, and cream cheese. Whole-fat or sweetened yogurt. Full-fat cheeses or blue cheese. Non-dairy creamers and whipped toppings. Processed cheese, cheese spreads, or cheese curds.  Condiments Onion and garlic salt, seasoned salt, table salt, and sea salt. Canned and packaged gravies. Worcestershire sauce. Tartar sauce. Barbecue sauce. Teriyaki sauce. Soy sauce, including reduced sodium. Steak sauce. Fish sauce. Oyster sauce. Cocktail sauce. Horseradish. Ketchup and mustard. Meat flavorings and tenderizers. Bouillon cubes. Hot sauce. Tabasco sauce. Marinades. Taco seasonings. Relishes.  Fats and Oils Butter, stick margarine, lard, shortening and bacon fat. Coconut, palm kernel, or palm oils. Regular salad dressings.  Pickles and olives. Salted popcorn and pretzels.  The items listed above may not be a complete list of foods and beverages to avoid.

## 2018-10-08 ENCOUNTER — Ambulatory Visit (INDEPENDENT_AMBULATORY_CARE_PROVIDER_SITE_OTHER): Payer: Medicare Other | Admitting: Internal Medicine

## 2018-10-08 ENCOUNTER — Encounter: Payer: Self-pay | Admitting: Internal Medicine

## 2018-10-08 VITALS — BP 138/76 | HR 100 | Temp 97.9°F | Resp 14 | Ht 59.5 in | Wt 203.4 lb

## 2018-10-08 DIAGNOSIS — E1129 Type 2 diabetes mellitus with other diabetic kidney complication: Secondary | ICD-10-CM | POA: Diagnosis not present

## 2018-10-08 DIAGNOSIS — Z992 Dependence on renal dialysis: Secondary | ICD-10-CM

## 2018-10-08 DIAGNOSIS — Z79899 Other long term (current) drug therapy: Secondary | ICD-10-CM

## 2018-10-08 DIAGNOSIS — I10 Essential (primary) hypertension: Secondary | ICD-10-CM | POA: Diagnosis not present

## 2018-10-08 DIAGNOSIS — N186 End stage renal disease: Secondary | ICD-10-CM | POA: Diagnosis not present

## 2018-10-08 DIAGNOSIS — I5032 Chronic diastolic (congestive) heart failure: Secondary | ICD-10-CM | POA: Diagnosis not present

## 2018-10-08 DIAGNOSIS — E559 Vitamin D deficiency, unspecified: Secondary | ICD-10-CM

## 2018-10-08 DIAGNOSIS — Z1212 Encounter for screening for malignant neoplasm of rectum: Secondary | ICD-10-CM

## 2018-10-08 DIAGNOSIS — Z8249 Family history of ischemic heart disease and other diseases of the circulatory system: Secondary | ICD-10-CM | POA: Diagnosis not present

## 2018-10-08 DIAGNOSIS — I480 Paroxysmal atrial fibrillation: Secondary | ICD-10-CM

## 2018-10-08 DIAGNOSIS — D631 Anemia in chronic kidney disease: Secondary | ICD-10-CM

## 2018-10-08 DIAGNOSIS — R5383 Other fatigue: Secondary | ICD-10-CM | POA: Diagnosis not present

## 2018-10-08 DIAGNOSIS — Z136 Encounter for screening for cardiovascular disorders: Secondary | ICD-10-CM | POA: Diagnosis not present

## 2018-10-08 DIAGNOSIS — D518 Other vitamin B12 deficiency anemias: Secondary | ICD-10-CM | POA: Diagnosis not present

## 2018-10-08 DIAGNOSIS — Z1211 Encounter for screening for malignant neoplasm of colon: Secondary | ICD-10-CM

## 2018-10-08 DIAGNOSIS — E1122 Type 2 diabetes mellitus with diabetic chronic kidney disease: Secondary | ICD-10-CM | POA: Diagnosis not present

## 2018-10-08 DIAGNOSIS — IMO0001 Reserved for inherently not codable concepts without codable children: Secondary | ICD-10-CM

## 2018-10-08 DIAGNOSIS — E782 Mixed hyperlipidemia: Secondary | ICD-10-CM | POA: Diagnosis not present

## 2018-10-08 DIAGNOSIS — Z794 Long term (current) use of insulin: Secondary | ICD-10-CM

## 2018-10-09 DIAGNOSIS — Z794 Long term (current) use of insulin: Secondary | ICD-10-CM | POA: Diagnosis not present

## 2018-10-09 DIAGNOSIS — D518 Other vitamin B12 deficiency anemias: Secondary | ICD-10-CM | POA: Diagnosis not present

## 2018-10-09 DIAGNOSIS — E782 Mixed hyperlipidemia: Secondary | ICD-10-CM | POA: Diagnosis not present

## 2018-10-09 DIAGNOSIS — E1122 Type 2 diabetes mellitus with diabetic chronic kidney disease: Secondary | ICD-10-CM | POA: Diagnosis not present

## 2018-10-09 DIAGNOSIS — E559 Vitamin D deficiency, unspecified: Secondary | ICD-10-CM | POA: Diagnosis not present

## 2018-10-09 DIAGNOSIS — R5383 Other fatigue: Secondary | ICD-10-CM | POA: Diagnosis not present

## 2018-10-09 DIAGNOSIS — D631 Anemia in chronic kidney disease: Secondary | ICD-10-CM | POA: Diagnosis not present

## 2018-10-09 DIAGNOSIS — Z992 Dependence on renal dialysis: Secondary | ICD-10-CM | POA: Diagnosis not present

## 2018-10-09 DIAGNOSIS — N186 End stage renal disease: Secondary | ICD-10-CM | POA: Diagnosis not present

## 2018-10-09 DIAGNOSIS — E118 Type 2 diabetes mellitus with unspecified complications: Secondary | ICD-10-CM | POA: Diagnosis not present

## 2018-10-09 DIAGNOSIS — I10 Essential (primary) hypertension: Secondary | ICD-10-CM | POA: Diagnosis not present

## 2018-10-09 DIAGNOSIS — Z79899 Other long term (current) drug therapy: Secondary | ICD-10-CM | POA: Diagnosis not present

## 2018-10-09 DIAGNOSIS — N2581 Secondary hyperparathyroidism of renal origin: Secondary | ICD-10-CM | POA: Diagnosis not present

## 2018-10-09 DIAGNOSIS — I480 Paroxysmal atrial fibrillation: Secondary | ICD-10-CM | POA: Diagnosis not present

## 2018-10-10 LAB — URIC ACID: Uric Acid, Serum: 2 mg/dL — ABNORMAL LOW (ref 2.5–7.0)

## 2018-10-10 LAB — IRON, TOTAL/TOTAL IRON BINDING CAP
%SAT: 33 % (calc) (ref 16–45)
Iron: 92 ug/dL (ref 45–160)
TIBC: 278 mcg/dL (calc) (ref 250–450)

## 2018-10-10 LAB — CBC WITH DIFFERENTIAL/PLATELET
Basophils Absolute: 42 cells/uL (ref 0–200)
Basophils Relative: 0.7 %
Eosinophils Absolute: 282 cells/uL (ref 15–500)
Eosinophils Relative: 4.7 %
HCT: 28.2 % — ABNORMAL LOW (ref 35.0–45.0)
Hemoglobin: 9.2 g/dL — ABNORMAL LOW (ref 11.7–15.5)
Lymphs Abs: 1392 cells/uL (ref 850–3900)
MCH: 28.1 pg (ref 27.0–33.0)
MCHC: 32.6 g/dL (ref 32.0–36.0)
MCV: 86.2 fL (ref 80.0–100.0)
MPV: 12.4 fL (ref 7.5–12.5)
Monocytes Relative: 9.7 %
Neutro Abs: 3702 cells/uL (ref 1500–7800)
Neutrophils Relative %: 61.7 %
Platelets: 217 10*3/uL (ref 140–400)
RBC: 3.27 10*6/uL — ABNORMAL LOW (ref 3.80–5.10)
RDW: 14.5 % (ref 11.0–15.0)
Total Lymphocyte: 23.2 %
WBC mixed population: 582 cells/uL (ref 200–950)
WBC: 6 10*3/uL (ref 3.8–10.8)

## 2018-10-10 LAB — HEMOGLOBIN A1C
Hgb A1c MFr Bld: 7.1 % of total Hgb — ABNORMAL HIGH (ref <5.7)
Mean Plasma Glucose: 157 (calc)
eAG (mmol/L): 8.7 (calc)

## 2018-10-10 LAB — COMPLETE METABOLIC PANEL WITH GFR
AG Ratio: 1.2 (calc) (ref 1.0–2.5)
ALT: 7 U/L (ref 6–29)
AST: 11 U/L (ref 10–35)
Albumin: 4.6 g/dL (ref 3.6–5.1)
Alkaline phosphatase (APISO): 47 U/L (ref 33–130)
BUN/Creatinine Ratio: 4 (calc) — ABNORMAL LOW (ref 6–22)
BUN: 16 mg/dL (ref 7–25)
CO2: 31 mmol/L (ref 20–32)
Calcium: 8.8 mg/dL (ref 8.6–10.4)
Chloride: 92 mmol/L — ABNORMAL LOW (ref 98–110)
Creat: 4.03 mg/dL — ABNORMAL HIGH (ref 0.50–0.99)
GFR, Est African American: 13 mL/min/{1.73_m2} — ABNORMAL LOW (ref >=60)
GFR, Est Non African American: 11 mL/min/{1.73_m2} — ABNORMAL LOW (ref >=60)
Globulin: 4 g/dL (calc) — ABNORMAL HIGH (ref 1.9–3.7)
Glucose, Bld: 185 mg/dL — ABNORMAL HIGH (ref 65–99)
Potassium: 4 mmol/L (ref 3.5–5.3)
Sodium: 138 mmol/L (ref 135–146)
Total Bilirubin: 0.4 mg/dL (ref 0.2–1.2)
Total Protein: 8.6 g/dL — ABNORMAL HIGH (ref 6.1–8.1)

## 2018-10-10 LAB — LIPID PANEL
Cholesterol: 167 mg/dL (ref <200)
HDL: 41 mg/dL — ABNORMAL LOW (ref >50)
LDL Cholesterol (Calc): 90 mg/dL (calc)
Non-HDL Cholesterol (Calc): 126 mg/dL (calc) (ref <130)
Total CHOL/HDL Ratio: 4.1 (calc) (ref <5.0)
Triglycerides: 300 mg/dL — ABNORMAL HIGH (ref <150)

## 2018-10-10 LAB — TSH: TSH: 3.08 mIU/L (ref 0.40–4.50)

## 2018-10-10 LAB — MAGNESIUM: Magnesium: 2 mg/dL (ref 1.5–2.5)

## 2018-10-10 LAB — VITAMIN B12: Vitamin B-12: 855 pg/mL (ref 200–1100)

## 2018-10-10 LAB — VITAMIN D 25 HYDROXY (VIT D DEFICIENCY, FRACTURES): Vit D, 25-Hydroxy: 28 ng/mL — ABNORMAL LOW (ref 30–100)

## 2018-10-11 ENCOUNTER — Other Ambulatory Visit: Payer: Self-pay | Admitting: Internal Medicine

## 2018-10-11 DIAGNOSIS — N186 End stage renal disease: Secondary | ICD-10-CM | POA: Diagnosis not present

## 2018-10-11 DIAGNOSIS — D518 Other vitamin B12 deficiency anemias: Secondary | ICD-10-CM

## 2018-10-11 DIAGNOSIS — D631 Anemia in chronic kidney disease: Secondary | ICD-10-CM | POA: Diagnosis not present

## 2018-10-11 DIAGNOSIS — E118 Type 2 diabetes mellitus with unspecified complications: Secondary | ICD-10-CM | POA: Diagnosis not present

## 2018-10-11 DIAGNOSIS — D509 Iron deficiency anemia, unspecified: Secondary | ICD-10-CM

## 2018-10-11 DIAGNOSIS — D649 Anemia, unspecified: Secondary | ICD-10-CM

## 2018-10-11 DIAGNOSIS — N2581 Secondary hyperparathyroidism of renal origin: Secondary | ICD-10-CM | POA: Diagnosis not present

## 2018-10-14 DIAGNOSIS — N186 End stage renal disease: Secondary | ICD-10-CM | POA: Diagnosis not present

## 2018-10-14 DIAGNOSIS — D631 Anemia in chronic kidney disease: Secondary | ICD-10-CM | POA: Diagnosis not present

## 2018-10-14 DIAGNOSIS — E118 Type 2 diabetes mellitus with unspecified complications: Secondary | ICD-10-CM | POA: Diagnosis not present

## 2018-10-14 DIAGNOSIS — N2581 Secondary hyperparathyroidism of renal origin: Secondary | ICD-10-CM | POA: Diagnosis not present

## 2018-10-15 ENCOUNTER — Encounter: Payer: Self-pay | Admitting: Cardiology

## 2018-10-15 ENCOUNTER — Ambulatory Visit (INDEPENDENT_AMBULATORY_CARE_PROVIDER_SITE_OTHER): Payer: Medicare Other | Admitting: Cardiology

## 2018-10-15 VITALS — BP 156/84 | HR 88 | Ht 59.5 in | Wt 205.4 lb

## 2018-10-15 DIAGNOSIS — I10 Essential (primary) hypertension: Secondary | ICD-10-CM | POA: Diagnosis not present

## 2018-10-15 DIAGNOSIS — Z9889 Other specified postprocedural states: Secondary | ICD-10-CM | POA: Diagnosis not present

## 2018-10-15 DIAGNOSIS — E7800 Pure hypercholesterolemia, unspecified: Secondary | ICD-10-CM | POA: Diagnosis not present

## 2018-10-15 DIAGNOSIS — I2510 Atherosclerotic heart disease of native coronary artery without angina pectoris: Secondary | ICD-10-CM

## 2018-10-15 NOTE — Patient Instructions (Signed)

## 2018-10-16 DIAGNOSIS — N2581 Secondary hyperparathyroidism of renal origin: Secondary | ICD-10-CM | POA: Diagnosis not present

## 2018-10-16 DIAGNOSIS — E118 Type 2 diabetes mellitus with unspecified complications: Secondary | ICD-10-CM | POA: Diagnosis not present

## 2018-10-16 DIAGNOSIS — D631 Anemia in chronic kidney disease: Secondary | ICD-10-CM | POA: Diagnosis not present

## 2018-10-16 DIAGNOSIS — N186 End stage renal disease: Secondary | ICD-10-CM | POA: Diagnosis not present

## 2018-10-18 DIAGNOSIS — N186 End stage renal disease: Secondary | ICD-10-CM | POA: Diagnosis not present

## 2018-10-18 DIAGNOSIS — N2581 Secondary hyperparathyroidism of renal origin: Secondary | ICD-10-CM | POA: Diagnosis not present

## 2018-10-18 DIAGNOSIS — D631 Anemia in chronic kidney disease: Secondary | ICD-10-CM | POA: Diagnosis not present

## 2018-10-18 DIAGNOSIS — E118 Type 2 diabetes mellitus with unspecified complications: Secondary | ICD-10-CM | POA: Diagnosis not present

## 2018-10-20 DIAGNOSIS — N186 End stage renal disease: Secondary | ICD-10-CM | POA: Diagnosis not present

## 2018-10-20 DIAGNOSIS — N2581 Secondary hyperparathyroidism of renal origin: Secondary | ICD-10-CM | POA: Diagnosis not present

## 2018-10-20 DIAGNOSIS — D631 Anemia in chronic kidney disease: Secondary | ICD-10-CM | POA: Diagnosis not present

## 2018-10-20 DIAGNOSIS — E118 Type 2 diabetes mellitus with unspecified complications: Secondary | ICD-10-CM | POA: Diagnosis not present

## 2018-10-22 DIAGNOSIS — N186 End stage renal disease: Secondary | ICD-10-CM | POA: Diagnosis not present

## 2018-10-22 DIAGNOSIS — D631 Anemia in chronic kidney disease: Secondary | ICD-10-CM | POA: Diagnosis not present

## 2018-10-22 DIAGNOSIS — N2581 Secondary hyperparathyroidism of renal origin: Secondary | ICD-10-CM | POA: Diagnosis not present

## 2018-10-22 DIAGNOSIS — E118 Type 2 diabetes mellitus with unspecified complications: Secondary | ICD-10-CM | POA: Diagnosis not present

## 2018-10-25 DIAGNOSIS — N186 End stage renal disease: Secondary | ICD-10-CM | POA: Diagnosis not present

## 2018-10-25 DIAGNOSIS — N2581 Secondary hyperparathyroidism of renal origin: Secondary | ICD-10-CM | POA: Diagnosis not present

## 2018-10-25 DIAGNOSIS — E118 Type 2 diabetes mellitus with unspecified complications: Secondary | ICD-10-CM | POA: Diagnosis not present

## 2018-10-25 DIAGNOSIS — D631 Anemia in chronic kidney disease: Secondary | ICD-10-CM | POA: Diagnosis not present

## 2018-10-27 DIAGNOSIS — E1129 Type 2 diabetes mellitus with other diabetic kidney complication: Secondary | ICD-10-CM | POA: Diagnosis not present

## 2018-10-27 DIAGNOSIS — Z992 Dependence on renal dialysis: Secondary | ICD-10-CM | POA: Diagnosis not present

## 2018-10-27 DIAGNOSIS — N186 End stage renal disease: Secondary | ICD-10-CM | POA: Diagnosis not present

## 2018-10-28 DIAGNOSIS — N2581 Secondary hyperparathyroidism of renal origin: Secondary | ICD-10-CM | POA: Diagnosis not present

## 2018-10-28 DIAGNOSIS — N186 End stage renal disease: Secondary | ICD-10-CM | POA: Diagnosis not present

## 2018-10-28 DIAGNOSIS — E118 Type 2 diabetes mellitus with unspecified complications: Secondary | ICD-10-CM | POA: Diagnosis not present

## 2018-10-28 DIAGNOSIS — D631 Anemia in chronic kidney disease: Secondary | ICD-10-CM | POA: Diagnosis not present

## 2018-10-28 DIAGNOSIS — D500 Iron deficiency anemia secondary to blood loss (chronic): Secondary | ICD-10-CM | POA: Diagnosis not present

## 2018-10-30 ENCOUNTER — Ambulatory Visit (INDEPENDENT_AMBULATORY_CARE_PROVIDER_SITE_OTHER): Payer: Medicare Other | Admitting: Nurse Practitioner

## 2018-10-30 ENCOUNTER — Other Ambulatory Visit (HOSPITAL_COMMUNITY)
Admission: RE | Admit: 2018-10-30 | Discharge: 2018-10-30 | Disposition: A | Discharge status: Home / Self Care | Payer: Medicare Other | Source: Ambulatory Visit | Attending: Nurse Practitioner | Admitting: Nurse Practitioner

## 2018-10-30 VITALS — BP 127/85 | HR 92 | Wt 198.6 lb

## 2018-10-30 DIAGNOSIS — E118 Type 2 diabetes mellitus with unspecified complications: Secondary | ICD-10-CM | POA: Diagnosis not present

## 2018-10-30 DIAGNOSIS — D500 Iron deficiency anemia secondary to blood loss (chronic): Secondary | ICD-10-CM | POA: Diagnosis not present

## 2018-10-30 DIAGNOSIS — Z124 Encounter for screening for malignant neoplasm of cervix: Secondary | ICD-10-CM

## 2018-10-30 DIAGNOSIS — Z01419 Encounter for gynecological examination (general) (routine) without abnormal findings: Secondary | ICD-10-CM | POA: Diagnosis not present

## 2018-10-30 DIAGNOSIS — N186 End stage renal disease: Secondary | ICD-10-CM | POA: Diagnosis not present

## 2018-10-30 DIAGNOSIS — N2581 Secondary hyperparathyroidism of renal origin: Secondary | ICD-10-CM | POA: Diagnosis not present

## 2018-10-30 DIAGNOSIS — I2510 Atherosclerotic heart disease of native coronary artery without angina pectoris: Secondary | ICD-10-CM | POA: Diagnosis not present

## 2018-10-30 DIAGNOSIS — D631 Anemia in chronic kidney disease: Secondary | ICD-10-CM | POA: Diagnosis not present

## 2018-10-30 NOTE — Progress Notes (Signed)
GYNECOLOGY OFFICE VISIT NOTE   History:  62 y.o. G3P3003 is here today for pap smear only to be able to be eligible for kidney transplant. She denies any abnormal vaginal discharge, bleeding, pelvic pain or other concerns.  She several chronic medical problems has other MDs who follow her complex medical care, but needs the Pap smear to finish her paperwork to apply for a kidney transplant.  Just finished dialysis today and is tired.  Last 2 notes reviewed.  Mammogram done this fall.    Past Medical History:  Diagnosis Date  . Anemia   . Chronic diastolic CHF (congestive heart failure) ([place])   . Chronic diastolic heart failure ([place])   . CKD (chronic kidney disease) stage 4, GFR 15-29 ml/min (HCC)    dialysis M/W/F  . Constipation   . CVA (cerebral infarction) 1997   no residual deficit  . Diverticulitis   . DM (diabetes mellitus) ([place])    type 2  . Heart murmur   . History of cardiovascular stress test    a. Myoview Oct 2012 showed EF 49%, no ischemia, LVE  . HLD (hyperlipidemia)    takes Crestor daily  . Hypertension   . Pericardial effusion    chronic; felt to be poss related to minoxidil >> DC'd  . Pulmonary hypertension ([place]) 06/18/2015  . Respiratory failure, acute hypoxic, post-operative 07/08/2015   Requiring ECMO support  . S/P cardiac catheterization    a. R/L HC 06/18/15:  mLAD 30%; severe pulmo HTN with PA sat 43%, CI 1.86, prominent V waves indicative of MR; resting hypoxemia O2 sat 86% on RA  . S/P minimally invasive mitral valve repair 07/08/2015   Complex valvuloplasty including artificial Gore-tex neochord placement x10 and 26 mm Sorin Memo 3D Rechord ring annuloplasty via right mini thoracotomy approach  . Severe mitral regurgitation   . Shortness of breath dyspnea   . Stroke ([place]) 1997   no residual effect  . Vitamin D deficiency   . Wears glasses     Past Surgical History:  Procedure Laterality Date  . A/V FISTULAGRAM Right 08/16/2017   Procedure: A/V  Fistulagram;  Surgeon: Conrad Turin, MD;  Location: [place] [facility];  Service: Cardiovascular;  Laterality: Right;  . AV FISTULA PLACEMENT Left 10/22/2015   Procedure: RADIOCEPHALIC ARTERIOVENOUS (AV) FISTULA CREATION;  Surgeon: Serafina Mitchell, MD;  Location: [place] [facility];  Service: Vascular;  Laterality: Left;  . AV FISTULA PLACEMENT Right 05/22/2017   Procedure: ARTERIOVENOUS (AV) FISTULA CREATION-RIGHT ARM;  Surgeon: Waynetta Sandy, MD;  Location: [place];  Service: Vascular;  Laterality: Right;  . CANNULATION FOR CARDIOPULMONARY BYPASS N/A 07/08/2015   Procedure: CANNULATION FOR ECMO;  Surgeon: Rexene Alberts, MD;  Location: [place];  Service: Open Heart Surgery;  Laterality: N/A;  . CARDIAC CATHETERIZATION    . CARDIAC CATHETERIZATION N/A 06/18/2015   Procedure: Right/Left Heart Cath and Coronary Angiography;  Surgeon: Jettie Booze, MD;  Location: [place] [facility];  Service: Cardiovascular;  Laterality: N/A;  . CESAREAN SECTION     x 2  . COLONOSCOPY    . EXCHANGE OF A DIALYSIS CATHETER N/A 06/28/2017   Procedure: EXCHANGE OF A DIALYSIS CATHETER;  Surgeon: Waynetta Sandy, MD;  Location: [place];  Service: Vascular;  Laterality: N/A;  . FISTULA SUPERFICIALIZATION Left 12/28/2015   Procedure: SUPERFICIALIZATION LEFT RADIOCEPHALIC FISTULA;  Surgeon: Serafina Mitchell, MD;  Location: [place];  Service: Vascular;  Laterality: Left;  . FISTULA SUPERFICIALIZATION Right  08/21/2017   Procedure: FISTULA SUPERFICIALIZATION RIGHT ARM;  Surgeon: Waynetta Sandy, MD;  Location: [place];  Service: Vascular;  Laterality: Right;  . INSERTION OF DIALYSIS CATHETER Right 04/28/2017   Procedure: INSERTION OF DIALYSIS CATHETER-RIGHT INTERNAL JUGULAR PLACEMENT;  Surgeon: Elam Dutch, MD;  Location: [place];  Service: Vascular;  Laterality: Right;  . LIGATION OF ARTERIOVENOUS  FISTULA Left 04/28/2017   Procedure: LIGATION OF ARTERIOVENOUS  FISTULA;  Surgeon: Elam Dutch, MD;  Location:  [place];  Service: Vascular;  Laterality: Left;  . MITRAL VALVE REPAIR Right 07/08/2015   Procedure: MINIMALLY INVASIVE MITRAL VALVE REPAIR with a 26 Sorin Memo 3D Rechord;  Surgeon: Rexene Alberts, MD;  Location: [place];  Service: Open Heart Surgery;  Laterality: Right;  . MULTIPLE EXTRACTIONS WITH ALVEOLOPLASTY N/A 06/30/2015   Procedure: MULTIPLE EXTRACTIONS OF TOOTH #'S 4  AND 30 WITH ALVEOLOPLASTY AND GROSS DEBRIDEMENT  OF REMAINING TEETH;  Surgeon: Lenn Cal, DDS;  Location: [facility];  Service: Oral Surgery;  Laterality: N/A;  . PERIPHERAL VASCULAR CATHETERIZATION Left 03/28/2016   Procedure: A/V Fistulagram;  Surgeon: Serafina Mitchell, MD;  Location: [place];  Service: Cardiovascular;  Laterality: Left;  lower arm  . TEE WITHOUT CARDIOVERSION N/A 06/08/2015   Procedure: TRANSESOPHAGEAL ECHOCARDIOGRAM (TEE);  Surgeon: Lelon Perla, MD;  Location[facility];  Service: Cardiovascular;  Laterality: N/A;  . TEE WITHOUT CARDIOVERSION N/A 07/08/2015   Procedure: TRANSESOPHAGEAL ECHOCARDIOGRAM (TEE);  Surgeon: Rexene Alberts, MD;  Location: [place];  Service: Open Heart Surgery;  Laterality: N/A;    The following portions of the patient's history were reviewed and updated as appropriate: allergies, current medications, past family history, past medical history, past social history, past surgical history and problem list.   Health Maintenance:  Normal pap in 2013.  Normal mammogram on 07-24-18.   Review of Systems:  Pertinent items noted in HPI and remainder of comprehensive ROS otherwise negative.  Objective:  Physical Exam BP 127/85   Pulse 92   Wt 198 lb 9.6 oz (90.1 kg)   BMI 39.44 kg/m  CONSTITUTIONAL: Well-developed, obese female in no acute distress.  EYES: Conjunctivae and EOM are normal. Pupils are equal, round.  No scleral icterus.  NECK: Normal range of motion, supple, no masses SKIN: Skin is warm and dry. No rash noted. Not diaphoretic. No erythema. No  pallor. NEUROLOGIC: Alert and oriented to person, place, and time. Normal muscle tone coordination. No cranial nerve deficit noted. PSYCHIATRIC: Normal mood and affect. Normal behavior. Normal judgment and thought content. ABDOMEN: Soft, no distention noted.  Scars noted. PELVIC: Pap done, no abnormalities noted, normal postmenopausal vagina.  Cervix very deep.  Unable to size uterus due to habitus.  MUSCULOSKELETAL: Normal range of motion. No edema noted.  Labs and Imaging No results found.  Assessment & Plan:  1. Encounter for Pap smear of cervix with HPV DNA cotesting Client very tired - had dialysis earlier today.   - Cytology - PAP( [place])   Routine preventative health maintenance measures emphasized. Please refer to After Visit Summary for other counseling recommendations.   Will follow up with her doctors as scheduled.  Total face-to-face time with patient: 15 minutes.  Over 50% of encounter was spent on counseling and coordination of care.  Earlie Server, RN, MSN, NP-BC Nurse Practitioner, [facility] for [employer], [facility] 10/30/2018 2:54 PM

## 2018-11-01 DIAGNOSIS — D631 Anemia in chronic kidney disease: Secondary | ICD-10-CM | POA: Diagnosis not present

## 2018-11-01 DIAGNOSIS — D500 Iron deficiency anemia secondary to blood loss (chronic): Secondary | ICD-10-CM | POA: Diagnosis not present

## 2018-11-01 DIAGNOSIS — E118 Type 2 diabetes mellitus with unspecified complications: Secondary | ICD-10-CM | POA: Diagnosis not present

## 2018-11-01 DIAGNOSIS — N186 End stage renal disease: Secondary | ICD-10-CM | POA: Diagnosis not present

## 2018-11-01 DIAGNOSIS — N2581 Secondary hyperparathyroidism of renal origin: Secondary | ICD-10-CM | POA: Diagnosis not present

## 2018-11-01 LAB — CYTOLOGY - PAP
Diagnosis: NEGATIVE
HPV: NOT DETECTED

## 2018-11-04 DIAGNOSIS — N186 End stage renal disease: Secondary | ICD-10-CM | POA: Diagnosis not present

## 2018-11-04 DIAGNOSIS — E118 Type 2 diabetes mellitus with unspecified complications: Secondary | ICD-10-CM | POA: Diagnosis not present

## 2018-11-04 DIAGNOSIS — D631 Anemia in chronic kidney disease: Secondary | ICD-10-CM | POA: Diagnosis not present

## 2018-11-04 DIAGNOSIS — N2581 Secondary hyperparathyroidism of renal origin: Secondary | ICD-10-CM | POA: Diagnosis not present

## 2018-11-04 DIAGNOSIS — D500 Iron deficiency anemia secondary to blood loss (chronic): Secondary | ICD-10-CM | POA: Diagnosis not present

## 2018-11-06 DIAGNOSIS — N186 End stage renal disease: Secondary | ICD-10-CM | POA: Diagnosis not present

## 2018-11-06 DIAGNOSIS — D500 Iron deficiency anemia secondary to blood loss (chronic): Secondary | ICD-10-CM | POA: Diagnosis not present

## 2018-11-06 DIAGNOSIS — N2581 Secondary hyperparathyroidism of renal origin: Secondary | ICD-10-CM | POA: Diagnosis not present

## 2018-11-06 DIAGNOSIS — E118 Type 2 diabetes mellitus with unspecified complications: Secondary | ICD-10-CM | POA: Diagnosis not present

## 2018-11-06 DIAGNOSIS — D631 Anemia in chronic kidney disease: Secondary | ICD-10-CM | POA: Diagnosis not present

## 2018-11-08 DIAGNOSIS — N186 End stage renal disease: Secondary | ICD-10-CM | POA: Diagnosis not present

## 2018-11-08 DIAGNOSIS — E118 Type 2 diabetes mellitus with unspecified complications: Secondary | ICD-10-CM | POA: Diagnosis not present

## 2018-11-08 DIAGNOSIS — N2581 Secondary hyperparathyroidism of renal origin: Secondary | ICD-10-CM | POA: Diagnosis not present

## 2018-11-08 DIAGNOSIS — D631 Anemia in chronic kidney disease: Secondary | ICD-10-CM | POA: Diagnosis not present

## 2018-11-08 DIAGNOSIS — D500 Iron deficiency anemia secondary to blood loss (chronic): Secondary | ICD-10-CM | POA: Diagnosis not present

## 2018-11-11 DIAGNOSIS — N186 End stage renal disease: Secondary | ICD-10-CM | POA: Diagnosis not present

## 2018-11-11 DIAGNOSIS — D500 Iron deficiency anemia secondary to blood loss (chronic): Secondary | ICD-10-CM | POA: Diagnosis not present

## 2018-11-11 DIAGNOSIS — N2581 Secondary hyperparathyroidism of renal origin: Secondary | ICD-10-CM | POA: Diagnosis not present

## 2018-11-11 DIAGNOSIS — D631 Anemia in chronic kidney disease: Secondary | ICD-10-CM | POA: Diagnosis not present

## 2018-11-11 DIAGNOSIS — E118 Type 2 diabetes mellitus with unspecified complications: Secondary | ICD-10-CM | POA: Diagnosis not present

## 2018-11-13 DIAGNOSIS — D631 Anemia in chronic kidney disease: Secondary | ICD-10-CM | POA: Diagnosis not present

## 2018-11-13 DIAGNOSIS — N2581 Secondary hyperparathyroidism of renal origin: Secondary | ICD-10-CM | POA: Diagnosis not present

## 2018-11-13 DIAGNOSIS — N186 End stage renal disease: Secondary | ICD-10-CM | POA: Diagnosis not present

## 2018-11-13 DIAGNOSIS — E118 Type 2 diabetes mellitus with unspecified complications: Secondary | ICD-10-CM | POA: Diagnosis not present

## 2018-11-13 DIAGNOSIS — D500 Iron deficiency anemia secondary to blood loss (chronic): Secondary | ICD-10-CM | POA: Diagnosis not present

## 2018-11-15 DIAGNOSIS — D500 Iron deficiency anemia secondary to blood loss (chronic): Secondary | ICD-10-CM | POA: Diagnosis not present

## 2018-11-15 DIAGNOSIS — D631 Anemia in chronic kidney disease: Secondary | ICD-10-CM | POA: Diagnosis not present

## 2018-11-15 DIAGNOSIS — E118 Type 2 diabetes mellitus with unspecified complications: Secondary | ICD-10-CM | POA: Diagnosis not present

## 2018-11-15 DIAGNOSIS — N186 End stage renal disease: Secondary | ICD-10-CM | POA: Diagnosis not present

## 2018-11-15 DIAGNOSIS — N2581 Secondary hyperparathyroidism of renal origin: Secondary | ICD-10-CM | POA: Diagnosis not present

## 2018-11-17 DIAGNOSIS — N2581 Secondary hyperparathyroidism of renal origin: Secondary | ICD-10-CM | POA: Diagnosis not present

## 2018-11-17 DIAGNOSIS — D631 Anemia in chronic kidney disease: Secondary | ICD-10-CM | POA: Diagnosis not present

## 2018-11-17 DIAGNOSIS — E118 Type 2 diabetes mellitus with unspecified complications: Secondary | ICD-10-CM | POA: Diagnosis not present

## 2018-11-17 DIAGNOSIS — N186 End stage renal disease: Secondary | ICD-10-CM | POA: Diagnosis not present

## 2018-11-17 DIAGNOSIS — D500 Iron deficiency anemia secondary to blood loss (chronic): Secondary | ICD-10-CM | POA: Diagnosis not present

## 2018-11-19 DIAGNOSIS — N2581 Secondary hyperparathyroidism of renal origin: Secondary | ICD-10-CM | POA: Diagnosis not present

## 2018-11-19 DIAGNOSIS — N186 End stage renal disease: Secondary | ICD-10-CM | POA: Diagnosis not present

## 2018-11-19 DIAGNOSIS — E118 Type 2 diabetes mellitus with unspecified complications: Secondary | ICD-10-CM | POA: Diagnosis not present

## 2018-11-19 DIAGNOSIS — D500 Iron deficiency anemia secondary to blood loss (chronic): Secondary | ICD-10-CM | POA: Diagnosis not present

## 2018-11-19 DIAGNOSIS — D631 Anemia in chronic kidney disease: Secondary | ICD-10-CM | POA: Diagnosis not present

## 2018-11-22 DIAGNOSIS — N2581 Secondary hyperparathyroidism of renal origin: Secondary | ICD-10-CM | POA: Diagnosis not present

## 2018-11-22 DIAGNOSIS — E118 Type 2 diabetes mellitus with unspecified complications: Secondary | ICD-10-CM | POA: Diagnosis not present

## 2018-11-22 DIAGNOSIS — N186 End stage renal disease: Secondary | ICD-10-CM | POA: Diagnosis not present

## 2018-11-22 DIAGNOSIS — D500 Iron deficiency anemia secondary to blood loss (chronic): Secondary | ICD-10-CM | POA: Diagnosis not present

## 2018-11-22 DIAGNOSIS — D631 Anemia in chronic kidney disease: Secondary | ICD-10-CM | POA: Diagnosis not present

## 2018-11-23 ENCOUNTER — Other Ambulatory Visit: Payer: Self-pay | Admitting: Internal Medicine

## 2018-11-24 DIAGNOSIS — D500 Iron deficiency anemia secondary to blood loss (chronic): Secondary | ICD-10-CM | POA: Diagnosis not present

## 2018-11-24 DIAGNOSIS — D631 Anemia in chronic kidney disease: Secondary | ICD-10-CM | POA: Diagnosis not present

## 2018-11-24 DIAGNOSIS — N186 End stage renal disease: Secondary | ICD-10-CM | POA: Diagnosis not present

## 2018-11-24 DIAGNOSIS — E118 Type 2 diabetes mellitus with unspecified complications: Secondary | ICD-10-CM | POA: Diagnosis not present

## 2018-11-24 DIAGNOSIS — N2581 Secondary hyperparathyroidism of renal origin: Secondary | ICD-10-CM | POA: Diagnosis not present

## 2018-11-26 DIAGNOSIS — N2581 Secondary hyperparathyroidism of renal origin: Secondary | ICD-10-CM | POA: Diagnosis not present

## 2018-11-26 DIAGNOSIS — E118 Type 2 diabetes mellitus with unspecified complications: Secondary | ICD-10-CM | POA: Diagnosis not present

## 2018-11-26 DIAGNOSIS — N186 End stage renal disease: Secondary | ICD-10-CM | POA: Diagnosis not present

## 2018-11-26 DIAGNOSIS — D631 Anemia in chronic kidney disease: Secondary | ICD-10-CM | POA: Diagnosis not present

## 2018-11-26 DIAGNOSIS — D500 Iron deficiency anemia secondary to blood loss (chronic): Secondary | ICD-10-CM | POA: Diagnosis not present

## 2018-11-27 DIAGNOSIS — N186 End stage renal disease: Secondary | ICD-10-CM | POA: Diagnosis not present

## 2018-11-27 DIAGNOSIS — Z992 Dependence on renal dialysis: Secondary | ICD-10-CM | POA: Diagnosis not present

## 2018-11-27 DIAGNOSIS — E1129 Type 2 diabetes mellitus with other diabetic kidney complication: Secondary | ICD-10-CM | POA: Diagnosis not present

## 2018-11-29 DIAGNOSIS — N186 End stage renal disease: Secondary | ICD-10-CM | POA: Diagnosis not present

## 2018-11-29 DIAGNOSIS — D631 Anemia in chronic kidney disease: Secondary | ICD-10-CM | POA: Diagnosis not present

## 2018-11-29 DIAGNOSIS — E118 Type 2 diabetes mellitus with unspecified complications: Secondary | ICD-10-CM | POA: Diagnosis not present

## 2018-11-29 DIAGNOSIS — D509 Iron deficiency anemia, unspecified: Secondary | ICD-10-CM | POA: Diagnosis not present

## 2018-11-29 DIAGNOSIS — N2581 Secondary hyperparathyroidism of renal origin: Secondary | ICD-10-CM | POA: Diagnosis not present

## 2018-12-02 DIAGNOSIS — D509 Iron deficiency anemia, unspecified: Secondary | ICD-10-CM | POA: Diagnosis not present

## 2018-12-02 DIAGNOSIS — D631 Anemia in chronic kidney disease: Secondary | ICD-10-CM | POA: Diagnosis not present

## 2018-12-02 DIAGNOSIS — E118 Type 2 diabetes mellitus with unspecified complications: Secondary | ICD-10-CM | POA: Diagnosis not present

## 2018-12-02 DIAGNOSIS — N2581 Secondary hyperparathyroidism of renal origin: Secondary | ICD-10-CM | POA: Diagnosis not present

## 2018-12-02 DIAGNOSIS — N186 End stage renal disease: Secondary | ICD-10-CM | POA: Diagnosis not present

## 2018-12-04 DIAGNOSIS — N186 End stage renal disease: Secondary | ICD-10-CM | POA: Diagnosis not present

## 2018-12-04 DIAGNOSIS — D631 Anemia in chronic kidney disease: Secondary | ICD-10-CM | POA: Diagnosis not present

## 2018-12-04 DIAGNOSIS — D509 Iron deficiency anemia, unspecified: Secondary | ICD-10-CM | POA: Diagnosis not present

## 2018-12-04 DIAGNOSIS — N2581 Secondary hyperparathyroidism of renal origin: Secondary | ICD-10-CM | POA: Diagnosis not present

## 2018-12-04 DIAGNOSIS — E118 Type 2 diabetes mellitus with unspecified complications: Secondary | ICD-10-CM | POA: Diagnosis not present

## 2018-12-05 DIAGNOSIS — I771 Stricture of artery: Secondary | ICD-10-CM | POA: Diagnosis not present

## 2018-12-05 DIAGNOSIS — N186 End stage renal disease: Secondary | ICD-10-CM | POA: Diagnosis not present

## 2018-12-05 DIAGNOSIS — Z992 Dependence on renal dialysis: Secondary | ICD-10-CM | POA: Diagnosis not present

## 2018-12-06 DIAGNOSIS — D509 Iron deficiency anemia, unspecified: Secondary | ICD-10-CM | POA: Diagnosis not present

## 2018-12-06 DIAGNOSIS — N2581 Secondary hyperparathyroidism of renal origin: Secondary | ICD-10-CM | POA: Diagnosis not present

## 2018-12-06 DIAGNOSIS — D631 Anemia in chronic kidney disease: Secondary | ICD-10-CM | POA: Diagnosis not present

## 2018-12-06 DIAGNOSIS — E118 Type 2 diabetes mellitus with unspecified complications: Secondary | ICD-10-CM | POA: Diagnosis not present

## 2018-12-06 DIAGNOSIS — N186 End stage renal disease: Secondary | ICD-10-CM | POA: Diagnosis not present

## 2018-12-09 DIAGNOSIS — D509 Iron deficiency anemia, unspecified: Secondary | ICD-10-CM | POA: Diagnosis not present

## 2018-12-09 DIAGNOSIS — E118 Type 2 diabetes mellitus with unspecified complications: Secondary | ICD-10-CM | POA: Diagnosis not present

## 2018-12-09 DIAGNOSIS — D631 Anemia in chronic kidney disease: Secondary | ICD-10-CM | POA: Diagnosis not present

## 2018-12-09 DIAGNOSIS — N186 End stage renal disease: Secondary | ICD-10-CM | POA: Diagnosis not present

## 2018-12-09 DIAGNOSIS — N2581 Secondary hyperparathyroidism of renal origin: Secondary | ICD-10-CM | POA: Diagnosis not present

## 2018-12-11 DIAGNOSIS — E118 Type 2 diabetes mellitus with unspecified complications: Secondary | ICD-10-CM | POA: Diagnosis not present

## 2018-12-11 DIAGNOSIS — N2581 Secondary hyperparathyroidism of renal origin: Secondary | ICD-10-CM | POA: Diagnosis not present

## 2018-12-11 DIAGNOSIS — N186 End stage renal disease: Secondary | ICD-10-CM | POA: Diagnosis not present

## 2018-12-11 DIAGNOSIS — D631 Anemia in chronic kidney disease: Secondary | ICD-10-CM | POA: Diagnosis not present

## 2018-12-11 DIAGNOSIS — D509 Iron deficiency anemia, unspecified: Secondary | ICD-10-CM | POA: Diagnosis not present

## 2018-12-13 DIAGNOSIS — N186 End stage renal disease: Secondary | ICD-10-CM | POA: Diagnosis not present

## 2018-12-13 DIAGNOSIS — N2581 Secondary hyperparathyroidism of renal origin: Secondary | ICD-10-CM | POA: Diagnosis not present

## 2018-12-13 DIAGNOSIS — D631 Anemia in chronic kidney disease: Secondary | ICD-10-CM | POA: Diagnosis not present

## 2018-12-13 DIAGNOSIS — D509 Iron deficiency anemia, unspecified: Secondary | ICD-10-CM | POA: Diagnosis not present

## 2018-12-13 DIAGNOSIS — E118 Type 2 diabetes mellitus with unspecified complications: Secondary | ICD-10-CM | POA: Diagnosis not present

## 2018-12-16 DIAGNOSIS — E118 Type 2 diabetes mellitus with unspecified complications: Secondary | ICD-10-CM | POA: Diagnosis not present

## 2018-12-16 DIAGNOSIS — N2581 Secondary hyperparathyroidism of renal origin: Secondary | ICD-10-CM | POA: Diagnosis not present

## 2018-12-16 DIAGNOSIS — N186 End stage renal disease: Secondary | ICD-10-CM | POA: Diagnosis not present

## 2018-12-16 DIAGNOSIS — D631 Anemia in chronic kidney disease: Secondary | ICD-10-CM | POA: Diagnosis not present

## 2018-12-16 DIAGNOSIS — D509 Iron deficiency anemia, unspecified: Secondary | ICD-10-CM | POA: Diagnosis not present

## 2018-12-18 DIAGNOSIS — N186 End stage renal disease: Secondary | ICD-10-CM | POA: Diagnosis not present

## 2018-12-18 DIAGNOSIS — D509 Iron deficiency anemia, unspecified: Secondary | ICD-10-CM | POA: Diagnosis not present

## 2018-12-18 DIAGNOSIS — D631 Anemia in chronic kidney disease: Secondary | ICD-10-CM | POA: Diagnosis not present

## 2018-12-18 DIAGNOSIS — N2581 Secondary hyperparathyroidism of renal origin: Secondary | ICD-10-CM | POA: Diagnosis not present

## 2018-12-18 DIAGNOSIS — E118 Type 2 diabetes mellitus with unspecified complications: Secondary | ICD-10-CM | POA: Diagnosis not present

## 2018-12-20 DIAGNOSIS — D509 Iron deficiency anemia, unspecified: Secondary | ICD-10-CM | POA: Diagnosis not present

## 2018-12-20 DIAGNOSIS — N2581 Secondary hyperparathyroidism of renal origin: Secondary | ICD-10-CM | POA: Diagnosis not present

## 2018-12-20 DIAGNOSIS — N186 End stage renal disease: Secondary | ICD-10-CM | POA: Diagnosis not present

## 2018-12-20 DIAGNOSIS — D631 Anemia in chronic kidney disease: Secondary | ICD-10-CM | POA: Diagnosis not present

## 2018-12-20 DIAGNOSIS — E118 Type 2 diabetes mellitus with unspecified complications: Secondary | ICD-10-CM | POA: Diagnosis not present

## 2018-12-23 DIAGNOSIS — N2581 Secondary hyperparathyroidism of renal origin: Secondary | ICD-10-CM | POA: Diagnosis not present

## 2018-12-23 DIAGNOSIS — D509 Iron deficiency anemia, unspecified: Secondary | ICD-10-CM | POA: Diagnosis not present

## 2018-12-23 DIAGNOSIS — N186 End stage renal disease: Secondary | ICD-10-CM | POA: Diagnosis not present

## 2018-12-23 DIAGNOSIS — E118 Type 2 diabetes mellitus with unspecified complications: Secondary | ICD-10-CM | POA: Diagnosis not present

## 2018-12-23 DIAGNOSIS — D631 Anemia in chronic kidney disease: Secondary | ICD-10-CM | POA: Diagnosis not present

## 2018-12-24 ENCOUNTER — Other Ambulatory Visit: Payer: Self-pay | Admitting: Internal Medicine

## 2018-12-24 DIAGNOSIS — Z794 Long term (current) use of insulin: Secondary | ICD-10-CM | POA: Diagnosis not present

## 2018-12-24 DIAGNOSIS — Z6841 Body Mass Index (BMI) 40.0 and over, adult: Secondary | ICD-10-CM | POA: Diagnosis not present

## 2018-12-24 DIAGNOSIS — E119 Type 2 diabetes mellitus without complications: Secondary | ICD-10-CM | POA: Diagnosis not present

## 2018-12-24 DIAGNOSIS — N186 End stage renal disease: Secondary | ICD-10-CM | POA: Diagnosis not present

## 2018-12-24 DIAGNOSIS — Z992 Dependence on renal dialysis: Secondary | ICD-10-CM | POA: Diagnosis not present

## 2018-12-24 DIAGNOSIS — Z713 Dietary counseling and surveillance: Secondary | ICD-10-CM | POA: Diagnosis not present

## 2018-12-25 DIAGNOSIS — N2581 Secondary hyperparathyroidism of renal origin: Secondary | ICD-10-CM | POA: Diagnosis not present

## 2018-12-25 DIAGNOSIS — N186 End stage renal disease: Secondary | ICD-10-CM | POA: Diagnosis not present

## 2018-12-25 DIAGNOSIS — D509 Iron deficiency anemia, unspecified: Secondary | ICD-10-CM | POA: Diagnosis not present

## 2018-12-25 DIAGNOSIS — E118 Type 2 diabetes mellitus with unspecified complications: Secondary | ICD-10-CM | POA: Diagnosis not present

## 2018-12-25 DIAGNOSIS — D631 Anemia in chronic kidney disease: Secondary | ICD-10-CM | POA: Diagnosis not present

## 2018-12-27 DIAGNOSIS — E118 Type 2 diabetes mellitus with unspecified complications: Secondary | ICD-10-CM | POA: Diagnosis not present

## 2018-12-27 DIAGNOSIS — D631 Anemia in chronic kidney disease: Secondary | ICD-10-CM | POA: Diagnosis not present

## 2018-12-27 DIAGNOSIS — N2581 Secondary hyperparathyroidism of renal origin: Secondary | ICD-10-CM | POA: Diagnosis not present

## 2018-12-27 DIAGNOSIS — N186 End stage renal disease: Secondary | ICD-10-CM | POA: Diagnosis not present

## 2018-12-27 DIAGNOSIS — D509 Iron deficiency anemia, unspecified: Secondary | ICD-10-CM | POA: Diagnosis not present

## 2018-12-28 DIAGNOSIS — Z992 Dependence on renal dialysis: Secondary | ICD-10-CM | POA: Diagnosis not present

## 2018-12-28 DIAGNOSIS — E1129 Type 2 diabetes mellitus with other diabetic kidney complication: Secondary | ICD-10-CM | POA: Diagnosis not present

## 2018-12-28 DIAGNOSIS — N186 End stage renal disease: Secondary | ICD-10-CM | POA: Diagnosis not present

## 2018-12-30 DIAGNOSIS — E118 Type 2 diabetes mellitus with unspecified complications: Secondary | ICD-10-CM | POA: Diagnosis not present

## 2018-12-30 DIAGNOSIS — D631 Anemia in chronic kidney disease: Secondary | ICD-10-CM | POA: Diagnosis not present

## 2018-12-30 DIAGNOSIS — N186 End stage renal disease: Secondary | ICD-10-CM | POA: Diagnosis not present

## 2018-12-30 DIAGNOSIS — N2581 Secondary hyperparathyroidism of renal origin: Secondary | ICD-10-CM | POA: Diagnosis not present

## 2018-12-30 DIAGNOSIS — D509 Iron deficiency anemia, unspecified: Secondary | ICD-10-CM | POA: Diagnosis not present

## 2018-12-31 DIAGNOSIS — Z794 Long term (current) use of insulin: Secondary | ICD-10-CM | POA: Diagnosis not present

## 2018-12-31 DIAGNOSIS — I5032 Chronic diastolic (congestive) heart failure: Secondary | ICD-10-CM | POA: Diagnosis not present

## 2018-12-31 DIAGNOSIS — I050 Rheumatic mitral stenosis: Secondary | ICD-10-CM | POA: Diagnosis not present

## 2018-12-31 DIAGNOSIS — Z6837 Body mass index (BMI) 37.0-37.9, adult: Secondary | ICD-10-CM | POA: Diagnosis not present

## 2018-12-31 DIAGNOSIS — N185 Chronic kidney disease, stage 5: Secondary | ICD-10-CM | POA: Diagnosis not present

## 2018-12-31 DIAGNOSIS — E1122 Type 2 diabetes mellitus with diabetic chronic kidney disease: Secondary | ICD-10-CM | POA: Diagnosis not present

## 2018-12-31 DIAGNOSIS — I272 Pulmonary hypertension, unspecified: Secondary | ICD-10-CM | POA: Diagnosis not present

## 2018-12-31 DIAGNOSIS — Z992 Dependence on renal dialysis: Secondary | ICD-10-CM | POA: Diagnosis not present

## 2018-12-31 DIAGNOSIS — Z9889 Other specified postprocedural states: Secondary | ICD-10-CM | POA: Diagnosis not present

## 2018-12-31 DIAGNOSIS — I132 Hypertensive heart and chronic kidney disease with heart failure and with stage 5 chronic kidney disease, or end stage renal disease: Secondary | ICD-10-CM | POA: Diagnosis not present

## 2018-12-31 DIAGNOSIS — Z01818 Encounter for other preprocedural examination: Secondary | ICD-10-CM | POA: Diagnosis not present

## 2018-12-31 DIAGNOSIS — I5030 Unspecified diastolic (congestive) heart failure: Secondary | ICD-10-CM | POA: Diagnosis not present

## 2018-12-31 DIAGNOSIS — E669 Obesity, unspecified: Secondary | ICD-10-CM | POA: Diagnosis not present

## 2018-12-31 DIAGNOSIS — Z888 Allergy status to other drugs, medicaments and biological substances status: Secondary | ICD-10-CM | POA: Diagnosis not present

## 2018-12-31 DIAGNOSIS — Z8673 Personal history of transient ischemic attack (TIA), and cerebral infarction without residual deficits: Secondary | ICD-10-CM | POA: Diagnosis not present

## 2018-12-31 DIAGNOSIS — Z8679 Personal history of other diseases of the circulatory system: Secondary | ICD-10-CM | POA: Diagnosis not present

## 2019-01-01 DIAGNOSIS — E118 Type 2 diabetes mellitus with unspecified complications: Secondary | ICD-10-CM | POA: Diagnosis not present

## 2019-01-01 DIAGNOSIS — D509 Iron deficiency anemia, unspecified: Secondary | ICD-10-CM | POA: Diagnosis not present

## 2019-01-01 DIAGNOSIS — N2581 Secondary hyperparathyroidism of renal origin: Secondary | ICD-10-CM | POA: Diagnosis not present

## 2019-01-01 DIAGNOSIS — D631 Anemia in chronic kidney disease: Secondary | ICD-10-CM | POA: Diagnosis not present

## 2019-01-01 DIAGNOSIS — N186 End stage renal disease: Secondary | ICD-10-CM | POA: Diagnosis not present

## 2019-01-03 DIAGNOSIS — D631 Anemia in chronic kidney disease: Secondary | ICD-10-CM | POA: Diagnosis not present

## 2019-01-03 DIAGNOSIS — D509 Iron deficiency anemia, unspecified: Secondary | ICD-10-CM | POA: Diagnosis not present

## 2019-01-03 DIAGNOSIS — E118 Type 2 diabetes mellitus with unspecified complications: Secondary | ICD-10-CM | POA: Diagnosis not present

## 2019-01-03 DIAGNOSIS — N2581 Secondary hyperparathyroidism of renal origin: Secondary | ICD-10-CM | POA: Diagnosis not present

## 2019-01-03 DIAGNOSIS — N186 End stage renal disease: Secondary | ICD-10-CM | POA: Diagnosis not present

## 2019-01-06 DIAGNOSIS — N186 End stage renal disease: Secondary | ICD-10-CM | POA: Diagnosis not present

## 2019-01-06 DIAGNOSIS — N2581 Secondary hyperparathyroidism of renal origin: Secondary | ICD-10-CM | POA: Diagnosis not present

## 2019-01-06 DIAGNOSIS — D509 Iron deficiency anemia, unspecified: Secondary | ICD-10-CM | POA: Diagnosis not present

## 2019-01-06 DIAGNOSIS — E118 Type 2 diabetes mellitus with unspecified complications: Secondary | ICD-10-CM | POA: Diagnosis not present

## 2019-01-06 DIAGNOSIS — D631 Anemia in chronic kidney disease: Secondary | ICD-10-CM | POA: Diagnosis not present

## 2019-01-08 DIAGNOSIS — D631 Anemia in chronic kidney disease: Secondary | ICD-10-CM | POA: Diagnosis not present

## 2019-01-08 DIAGNOSIS — E118 Type 2 diabetes mellitus with unspecified complications: Secondary | ICD-10-CM | POA: Diagnosis not present

## 2019-01-08 DIAGNOSIS — N186 End stage renal disease: Secondary | ICD-10-CM | POA: Diagnosis not present

## 2019-01-08 DIAGNOSIS — N2581 Secondary hyperparathyroidism of renal origin: Secondary | ICD-10-CM | POA: Diagnosis not present

## 2019-01-08 DIAGNOSIS — D509 Iron deficiency anemia, unspecified: Secondary | ICD-10-CM | POA: Diagnosis not present

## 2019-01-10 DIAGNOSIS — D631 Anemia in chronic kidney disease: Secondary | ICD-10-CM | POA: Diagnosis not present

## 2019-01-10 DIAGNOSIS — E118 Type 2 diabetes mellitus with unspecified complications: Secondary | ICD-10-CM | POA: Diagnosis not present

## 2019-01-10 DIAGNOSIS — D509 Iron deficiency anemia, unspecified: Secondary | ICD-10-CM | POA: Diagnosis not present

## 2019-01-10 DIAGNOSIS — N186 End stage renal disease: Secondary | ICD-10-CM | POA: Diagnosis not present

## 2019-01-10 DIAGNOSIS — N2581 Secondary hyperparathyroidism of renal origin: Secondary | ICD-10-CM | POA: Diagnosis not present

## 2019-01-13 DIAGNOSIS — N186 End stage renal disease: Secondary | ICD-10-CM | POA: Diagnosis not present

## 2019-01-13 DIAGNOSIS — D509 Iron deficiency anemia, unspecified: Secondary | ICD-10-CM | POA: Diagnosis not present

## 2019-01-13 DIAGNOSIS — N2581 Secondary hyperparathyroidism of renal origin: Secondary | ICD-10-CM | POA: Diagnosis not present

## 2019-01-13 DIAGNOSIS — D631 Anemia in chronic kidney disease: Secondary | ICD-10-CM | POA: Diagnosis not present

## 2019-01-13 DIAGNOSIS — E118 Type 2 diabetes mellitus with unspecified complications: Secondary | ICD-10-CM | POA: Diagnosis not present

## 2019-01-14 DIAGNOSIS — I081 Rheumatic disorders of both mitral and tricuspid valves: Secondary | ICD-10-CM | POA: Diagnosis not present

## 2019-01-14 DIAGNOSIS — I5032 Chronic diastolic (congestive) heart failure: Secondary | ICD-10-CM | POA: Diagnosis not present

## 2019-01-14 DIAGNOSIS — I517 Cardiomegaly: Secondary | ICD-10-CM | POA: Diagnosis not present

## 2019-01-14 DIAGNOSIS — E119 Type 2 diabetes mellitus without complications: Secondary | ICD-10-CM | POA: Diagnosis not present

## 2019-01-14 DIAGNOSIS — Z794 Long term (current) use of insulin: Secondary | ICD-10-CM | POA: Diagnosis not present

## 2019-01-15 DIAGNOSIS — D509 Iron deficiency anemia, unspecified: Secondary | ICD-10-CM | POA: Diagnosis not present

## 2019-01-15 DIAGNOSIS — E118 Type 2 diabetes mellitus with unspecified complications: Secondary | ICD-10-CM | POA: Diagnosis not present

## 2019-01-15 DIAGNOSIS — D631 Anemia in chronic kidney disease: Secondary | ICD-10-CM | POA: Diagnosis not present

## 2019-01-15 DIAGNOSIS — N186 End stage renal disease: Secondary | ICD-10-CM | POA: Diagnosis not present

## 2019-01-15 DIAGNOSIS — N2581 Secondary hyperparathyroidism of renal origin: Secondary | ICD-10-CM | POA: Diagnosis not present

## 2019-01-16 ENCOUNTER — Ambulatory Visit: Payer: Self-pay | Admitting: Adult Health

## 2019-01-17 DIAGNOSIS — N186 End stage renal disease: Secondary | ICD-10-CM | POA: Diagnosis not present

## 2019-01-17 DIAGNOSIS — E118 Type 2 diabetes mellitus with unspecified complications: Secondary | ICD-10-CM | POA: Diagnosis not present

## 2019-01-17 DIAGNOSIS — D631 Anemia in chronic kidney disease: Secondary | ICD-10-CM | POA: Diagnosis not present

## 2019-01-17 DIAGNOSIS — D509 Iron deficiency anemia, unspecified: Secondary | ICD-10-CM | POA: Diagnosis not present

## 2019-01-17 DIAGNOSIS — N2581 Secondary hyperparathyroidism of renal origin: Secondary | ICD-10-CM | POA: Diagnosis not present

## 2019-01-20 DIAGNOSIS — D509 Iron deficiency anemia, unspecified: Secondary | ICD-10-CM | POA: Diagnosis not present

## 2019-01-20 DIAGNOSIS — D631 Anemia in chronic kidney disease: Secondary | ICD-10-CM | POA: Diagnosis not present

## 2019-01-20 DIAGNOSIS — N2581 Secondary hyperparathyroidism of renal origin: Secondary | ICD-10-CM | POA: Diagnosis not present

## 2019-01-20 DIAGNOSIS — E118 Type 2 diabetes mellitus with unspecified complications: Secondary | ICD-10-CM | POA: Diagnosis not present

## 2019-01-20 DIAGNOSIS — N186 End stage renal disease: Secondary | ICD-10-CM | POA: Diagnosis not present

## 2019-01-22 DIAGNOSIS — N2581 Secondary hyperparathyroidism of renal origin: Secondary | ICD-10-CM | POA: Diagnosis not present

## 2019-01-22 DIAGNOSIS — D509 Iron deficiency anemia, unspecified: Secondary | ICD-10-CM | POA: Diagnosis not present

## 2019-01-22 DIAGNOSIS — F325 Major depressive disorder, single episode, in full remission: Secondary | ICD-10-CM | POA: Insufficient documentation

## 2019-01-22 DIAGNOSIS — E118 Type 2 diabetes mellitus with unspecified complications: Secondary | ICD-10-CM | POA: Diagnosis not present

## 2019-01-22 DIAGNOSIS — D631 Anemia in chronic kidney disease: Secondary | ICD-10-CM | POA: Diagnosis not present

## 2019-01-22 DIAGNOSIS — N186 End stage renal disease: Secondary | ICD-10-CM | POA: Diagnosis not present

## 2019-01-22 NOTE — Progress Notes (Signed)
ANNUAL MEDICARE VISIT AND FOLLOW UP  Assessment:   Jody Nguyen was seen today for medicare wellness and follow-up.  Diagnoses and all orders for this visit:  Medicare annual wellness visit  Essential hypertension Elevated; has been off medication; refilled to restart Monitor blood pressure at home; call if consistently over 130/80 Continue DASH diet.   Reminder to go to the ER if any CP, SOB, nausea, dizziness, severe HA, changes vision/speech, left arm numbness and tingling and jaw pain  Chronic diastolic heart failure (HCC)       Followed by cardiology; edema resolved since dialysis  Insulin dependent diabetes with renal manifestation (HCC) Insulin 70/30 PRN hyperglycemia 180<; has not needed in past 12+ months Reminded eye Exam yearly (has done for 2019, report requested from Jody Nguyen) and Dental Exam every 6 months. Dietary recommendations reviewed  Physical Activity recommendations reviewed -     Hemoglobin A1c  ESRD on dialysis [facility] Doing well with dialysis after extensive fistula access issues; MWF; followed by Dr. Joelyn Oms [facility] for renal transplant via [facility] -     CMP/GFR  Anemia in chronic kidney disease, on chronic dialysis ([place]) Followed by nephrology -     CBC with Differential/Platelet  Hyperlipidemia, mixed Continue medications: crestor 20 mg daily Continue low cholesterol diet and exercise.  -     Lipid panel -     TSH  Vitamin D deficiency Followed by nephrology; continue supplementation -     VITAMIN D 25 Hydroxy (Vit-D Deficiency, Fractures)  Medication management -     CBC with Differential/Platelet -     CMP/GFR  Morbid obesity (BMI 36.05)  Commended on weight loss progress Long discussion about weight loss, diet, and exercise Recommended diet heavy in fruits and veggies and low in animal meats, cheeses, and dairy products, appropriate calorie intake Discussed appropriate weight for height, initial goal weight of 175lb discussed Follow up 3  months  Pulmonary hypertension (HCC) Monitoring, symptoms improved from previous, stable Pulmonology referral as indicated  Cardiomyopathy, unspecified type ([place]) Followed by cardiology  Paroxysmal atrial fibrillation ([place]) No longer on anticoagulation; continue follow up with cardiology  Chronic periodontitis Reminded to schedule follow up with dental  S/P minimally invasive mitral valve repair Followed by cardiology  Depression in remission In remission on medicatoins; Continue zoloft, buspar  Lifestyle discussed: diet/exerise, sleep hygiene, stress management, hydration  Need for tetanus vaccine  - Td administered   Over 40 minutes of exam, counseling, chart review and critical decision making was performed Future Appointments  Date Time Provider [place]  04/22/2019 10:30 AM Unk Pinto, MD [facility] None  10/30/2019  2:00 PM Unk Pinto, MD [facility] None     Plan:   During the course of the visit the patient was educated and counseled about appropriate screening and preventive services including:    Pneumococcal vaccine   Prevnar 13  Influenza vaccine  Td vaccine  Screening electrocardiogram  Bone densitometry screening  Colorectal cancer screening  Diabetes screening  Glaucoma screening  Nutrition counseling   Advanced directives: requested   Subjective:  Jody Nguyen is a 63 y.o. female who presents for Medicare Annual Wellness Visit and 3 month follow up.   In 2016, she underwent an elective AoVRw/Anesthesia complications with consequent multi systems failure then transferred to [facility] for ECMO  & Dialysis. She has been on Dialysis treatments 3 x/week, followed by Dr. Joelyn Oms. She is hopeful for a Kidney transplant. She just had an evaluation at [facility] in consideration  for a transplant.  She reports she is doing very well recently. She is followed by cardiology with extensive hx including cath, bypass and  mitral valve repair in 2016.  Reports she is doing well in this past year, denies CP, dyspnea, palpitations, orthopnea, LE edema.   she has a diagnosis of depression in remission on medication and currently remains on zoloft 25 mg, buspar 10 mg BID, reports symptoms are well controlled on current regimen.   BMI is Body mass index is 38.09 kg/m., she has been working on diet and exercise, walking 30 min 3 days a week on non-dialysis days, has been cutting out meat, focusing on vegetables, beans, nuts, down 14# in past 3 months.  Wt Readings from Last 3 Encounters:  01/23/19 191 lb 12.8 oz (87 kg)  10/30/18 198 lb 9.6 oz (90.1 kg)  10/15/18 205 lb 6.4 oz (93.2 kg)    Her blood pressure has been controlled at home, today their BP is BP: (!) 150/90 But has unknowing been off of BP medication, refilled and will restart.  She does workout. She denies chest pain, shortness of breath, dizziness.   She is on cholesterol medication (rosuvastatin 20 mg daily) and denies myalgias. Her cholesterol is at goal. The cholesterol last visit was:   Lab Results  Component Value Date   CHOL 167 10/09/2018   HDL 41 (L) 10/09/2018   LDLCALC 90 10/09/2018   TRIG 300 (H) 10/09/2018   CHOLHDL 4.1 10/09/2018    She has been working on diet and exercise for T2 diabetes, on daily ASA, statin, not on ACE/ARB secondary to ESRD on dialysis, and denies foot ulcerations, hyperglycemia, hypoglycemia , increased appetite, nausea, paresthesia of the feet, polydipsia, polyuria, visual disturbances and vomiting. She takes insulin only as needed for glucose readings above 180. Has not needed in past 6 months. She reports fasting glucose typically 120-125. Last A1C in the office was:  Lab Results  Component Value Date   HGBA1C 7.1 (H) 10/09/2018   Last GFR: Lab Results  Component Value Date   GFRAA 13 (L) 10/09/2018   Patient is on Vitamin D supplement, taking what is administered to on dialysis days by nephrology  Lab  Results  Component Value Date   VD25OH 28 (L) 10/09/2018      Medication Review: Current Outpatient Medications on File Prior to Visit  Medication Sig Dispense Refill  . acetaminophen (TYLENOL) 500 MG tablet Take 1,000 mg by mouth every 6 (six) hours as needed (for pain.).    Marland Kitchen aspirin EC 81 MG tablet Take 81 mg by mouth daily.    . busPIRone (BUSPAR) 10 MG tablet TAKE 1 TABLET BY MOUTH TWICE DAILY 180 tablet 3  . carvedilol (COREG) 6.25 MG tablet Take 1 tablet (6.25 mg total) by mouth 2 (two) times daily. 180 tablet 1  . cinacalcet (SENSIPAR) 60 MG tablet Take 1 tablet (60 mg total) by mouth daily. 90 tablet 1  . insulin aspart protamine - aspart (NOVOLOG MIX 70/30 FLEXPEN) (70-30) 100 UNIT/ML FlexPen Inject 0.1 mLs (10 Units total) into the skin 2 (two) times daily. (Patient taking differently: Inject 10 Units into the skin 2 (two) times daily as needed (if blood sugar is above 180). ) 15 mL 3  . RENVELA 800 MG tablet Take 1,600-4,000 mg by mouth 3 (three) times daily. 4000 mg (5 caps) with meals and 1600 mg (2 caps) with snacks  5  . rosuvastatin (CRESTOR) 20 MG tablet TAKE 1 TABLET BY  MOUTH ONCE DAILY 90 tablet 3  . sertraline (ZOLOFT) 25 MG tablet TAKE 1 TABLET BY MOUTH ONCE DAILY 90 tablet 1  . azelastine (ASTELIN) 0.1 % nasal spray Use 1 to 2 sprays each nostril 2 to 3 x / day (Patient not taking: Reported on 01/23/2019) 90 mL 3  . latanoprost (XALATAN) 0.005 % ophthalmic solution Place 1 drop into both eyes daily.     No current facility-administered medications on file prior to visit.     Allergies  Allergen Reactions  . Minoxidil Other (See Comments)    Pericardial effusion  . Lipitor [Atorvastatin] Other (See Comments)    MYALGIAS > "pain in legs" Tolerates rosuvastatin  . Ace Inhibitors Other (See Comments)    REACTION: "not sure...think it made me drowsy all the time"    Current Problems (verified) Patient Active Problem List   Diagnosis Date Noted  . Major depression  in remission ([place]) 01/22/2019  . ESRD on dialysis ([place]) 10/09/2015  . Cardiomyopathy ([place]) 09/10/2015  . Atrial fibrillation ([place]) 09/10/2015  . S/P minimally invasive mitral valve repair 07/08/2015  . Chronic periodontitis 06/30/2015  . Chronic diastolic heart failure ([place])   . Pulmonary hypertension ([place]) 06/18/2015  . Morbid obesity (BMI 36.05)  03/29/2015  . Insulin dependent diabetes with renal manifestation ([place]) 11/22/2014  . Vitamin D deficiency 03/25/2014  . Anemia in chronic kidney disease   . Essential hypertension 08/17/2011  . Hyperlipidemia associated with type 2 diabetes mellitus ([place]) 08/17/2011    Screening Tests Immunization History  Administered Date(s) Administered  . Influenza Split 08/20/2012, 08/15/2017  . PPD Test 03/25/2014, 03/29/2015  . Pneumococcal Polysaccharide-23 09/11/2017  . Pneumococcal-Unspecified 07/28/2008  . Tdap 12/28/2008    Preventative care: Last colonoscopy: 2013  Last mammogram: 06/2018 Last pap smear/pelvic exam: 10/2018 neg HPV  DEXA: 2010 normal - not a candidate for bisphophonates - defer  Prior vaccinations: TD or Tdap: 2010, DUE   Influenza: 2019  Pneumococcal: 2018 Prevnar13: 2009 Shingles/Zostavax: zostavax, can't remember when, less than 4 years ago  Names of Other Physician/Practitioners you currently use: 1. [facility] here for primary care 2. Jody Nguyen, eye doctor, last visit 2019 per patient, report requested 3. Dr. Orene Desanctis, dentist, last visit 2016, has scheduled 02/17/2019  Patient Care Team: Unk Pinto, MD as PCP - General (Internal Medicine) Rexene Agent, MD as Attending Physician (Nephrology)  SURGICAL HISTORY She  has a past surgical history that includes Cesarean section; TEE without cardioversion (N/A, 06/08/2015); Cardiac catheterization; Cardiac catheterization (N/A, 06/18/2015); Colonoscopy; Multiple extractions with alveoloplasty (N/A, 06/30/2015); Mitral valve  repair (Right, 07/08/2015); TEE without cardioversion (N/A, 07/08/2015); Cannulation for cardiopulmonary bypass (N/A, 07/08/2015); AV fistula placement (Left, 10/22/2015); Fistula superficialization (Left, 12/28/2015); Cardiac catheterization (Left, 03/28/2016); Ligation of arteriovenous  fistula (Left, 04/28/2017); Insertion of dialysis catheter (Right, 04/28/2017); AV fistula placement (Right, 05/22/2017); Exchange of a dialysis catheter (N/A, 06/28/2017); A/V Fistulagram (Right, 08/16/2017); and Fistula superficialization (Right, 08/21/2017). FAMILY HISTORY Her family history includes Breast cancer in her sister; Cancer in her sister; Heart attack in her brother; Heart disease in her brother; Stroke in her mother. SOCIAL HISTORY She  reports that she has never smoked. She has never used smokeless tobacco. She reports that she does not drink alcohol or use drugs.   MEDICARE WELLNESS OBJECTIVES: Physical activity: Current Exercise Habits: Home exercise routine, Type of exercise: walking, Time (Minutes): 30, Frequency (Times/Week): 3, Weekly Exercise (Minutes/Week): 90, Intensity: Mild, Exercise limited by: Other - see comments(ESRD) Cardiac  risk factors: Cardiac Risk Factors include: advanced age (>48men, >85 women);microalbuminuria;obesity (BMI >30kg/m2);hypertension;diabetes mellitus;dyslipidemia Depression/mood screen:   Depression screen [facility] 2/9 01/23/2019  Decreased Interest 0  Down, Depressed, Hopeless 0  PHQ - 2 Score 0  Altered sleeping -  Tired, decreased energy -  Change in appetite -  Feeling bad or failure about yourself  -  Trouble concentrating -  Moving slowly or fidgety/restless -  Suicidal thoughts -  PHQ-9 Score -  Some recent data might be hidden    ADLs:  In your present state of health, do you have any difficulty performing the following activities: 01/23/2019 10/08/2018  Hearing? N N  Vision? N N  Difficulty concentrating or making decisions? N N  Walking or climbing stairs? N N   Dressing or bathing? N N  Doing errands, shopping? N N  Some recent data might be hidden     Cognitive Testing  Alert? Yes  Normal Appearance?Yes  Oriented to person? Yes  Place? Yes   Time? Yes  Recall of three objects?  Yes  Can perform simple calculations? Yes  Displays appropriate judgment?Yes  Can read the correct time from a watch face?Yes  EOL planning: Does Patient Have a Medical Advance Directive?: No Would patient like information on creating a medical advance directive?: Yes (MAU/Ambulatory/Procedural Areas - Information given)  Review of Systems  Constitutional: Negative for malaise/fatigue and weight loss.  HENT: Negative for hearing loss and tinnitus.   Eyes: Negative for blurred vision and double vision.  Respiratory: Negative for cough, shortness of breath and wheezing.   Cardiovascular: Negative for chest pain, palpitations, orthopnea, claudication and leg swelling.  Gastrointestinal: Negative for abdominal pain, blood in stool, constipation, diarrhea, heartburn, melena, nausea and vomiting.  Genitourinary: Negative.   Musculoskeletal: Negative for joint pain and myalgias.  Skin: Negative for rash.  Neurological: Negative for dizziness, tingling, sensory change, weakness and headaches.  Endo/Heme/Allergies: Negative for polydipsia.  Psychiatric/Behavioral: Negative.   All other systems reviewed and are negative.    Objective:     Today's Vitals   01/23/19 1550  BP: (!) 150/90  Pulse: 87  Temp: 97.7 F (36.5 C)  SpO2: 92%  Weight: 191 lb 12.8 oz (87 kg)  Height: 4' 11.5" (1.511 m)   Body mass index is 38.09 kg/m.  General appearance: alert, no distress, WD/WN, female HEENT: normocephalic, sclerae anicteric, TMs pearly, nares patent, no discharge or erythema, pharynx normal Oral cavity: MMM, no lesions Neck: supple, no lymphadenopathy, no thyromegaly, no masses Heart: RRR, normal S1, S2, no murmurs. AV fistula to RUE with scarring.  Lungs: CTA  bilaterally, no wheezes, rhonchi, or rales Abdomen: +bs, soft, non tender, non distended, no masses, no hepatomegaly, no splenomegaly Musculoskeletal: nontender, no swelling, no obvious deformity Extremities: no edema, no cyanosis, no clubbing Pulses: 2+ symmetric, upper and lower extremities, normal cap refill Neurological: alert, oriented x 3, CN2-12 intact, strength normal upper extremities and lower extremities, sensation normal throughout, DTRs 2+ throughout, no cerebellar signs, gait normal Psychiatric: normal affect, behavior normal, pleasant   Medicare Attestation I have personally reviewed: The patient's medical and social history Their use of alcohol, tobacco or illicit drugs Their current medications and supplements The patient's functional ability including ADLs,fall risks, home safety risks, cognitive, and hearing and visual impairment Diet and physical activities Evidence for depression or mood disorders  The patient's weight, height, BMI, and visual acuity have been recorded in the chart.  I have made referrals, counseling, and provided education to the  patient based on review of the above and I have provided the patient with a written personalized care plan for preventive services.     Izora Ribas, NP   01/23/2019

## 2019-01-23 ENCOUNTER — Encounter: Payer: Self-pay | Admitting: Adult Health

## 2019-01-23 ENCOUNTER — Ambulatory Visit (INDEPENDENT_AMBULATORY_CARE_PROVIDER_SITE_OTHER): Payer: Medicare Other | Admitting: Adult Health

## 2019-01-23 VITALS — BP 150/90 | HR 87 | Temp 97.7°F | Ht 59.5 in | Wt 191.8 lb

## 2019-01-23 DIAGNOSIS — IMO0001 Reserved for inherently not codable concepts without codable children: Secondary | ICD-10-CM

## 2019-01-23 DIAGNOSIS — R6889 Other general symptoms and signs: Secondary | ICD-10-CM | POA: Diagnosis not present

## 2019-01-23 DIAGNOSIS — Z0000 Encounter for general adult medical examination without abnormal findings: Secondary | ICD-10-CM

## 2019-01-23 DIAGNOSIS — E785 Hyperlipidemia, unspecified: Secondary | ICD-10-CM

## 2019-01-23 DIAGNOSIS — I480 Paroxysmal atrial fibrillation: Secondary | ICD-10-CM

## 2019-01-23 DIAGNOSIS — K0530 Chronic periodontitis, unspecified: Secondary | ICD-10-CM

## 2019-01-23 DIAGNOSIS — F325 Major depressive disorder, single episode, in full remission: Secondary | ICD-10-CM | POA: Diagnosis not present

## 2019-01-23 DIAGNOSIS — Z794 Long term (current) use of insulin: Secondary | ICD-10-CM | POA: Diagnosis not present

## 2019-01-23 DIAGNOSIS — Z9889 Other specified postprocedural states: Secondary | ICD-10-CM

## 2019-01-23 DIAGNOSIS — E1129 Type 2 diabetes mellitus with other diabetic kidney complication: Secondary | ICD-10-CM

## 2019-01-23 DIAGNOSIS — Z0001 Encounter for general adult medical examination with abnormal findings: Secondary | ICD-10-CM

## 2019-01-23 DIAGNOSIS — Z23 Encounter for immunization: Secondary | ICD-10-CM

## 2019-01-23 DIAGNOSIS — N186 End stage renal disease: Secondary | ICD-10-CM | POA: Diagnosis not present

## 2019-01-23 DIAGNOSIS — Z992 Dependence on renal dialysis: Secondary | ICD-10-CM | POA: Diagnosis not present

## 2019-01-23 DIAGNOSIS — I5032 Chronic diastolic (congestive) heart failure: Secondary | ICD-10-CM

## 2019-01-23 DIAGNOSIS — D631 Anemia in chronic kidney disease: Secondary | ICD-10-CM | POA: Diagnosis not present

## 2019-01-23 DIAGNOSIS — I272 Pulmonary hypertension, unspecified: Secondary | ICD-10-CM

## 2019-01-23 DIAGNOSIS — I429 Cardiomyopathy, unspecified: Secondary | ICD-10-CM

## 2019-01-23 DIAGNOSIS — I10 Essential (primary) hypertension: Secondary | ICD-10-CM | POA: Diagnosis not present

## 2019-01-23 DIAGNOSIS — E1169 Type 2 diabetes mellitus with other specified complication: Secondary | ICD-10-CM | POA: Diagnosis not present

## 2019-01-23 DIAGNOSIS — E559 Vitamin D deficiency, unspecified: Secondary | ICD-10-CM | POA: Diagnosis not present

## 2019-01-23 NOTE — Patient Instructions (Signed)
Jody Nguyen , Thank you for taking time to come for your Medicare Wellness Visit. I appreciate your ongoing commitment to your health goals. Please review the following plan we discussed and let me know if I can assist you in the future.   These are the goals we discussed: Goals    . Blood Pressure < 140/90    . Weight (lb) < 175 lb (79.4 kg)       This is a list of the screening recommended for you and due dates:  Health Maintenance  Topic Date Due  . Eye exam for diabetics  11/26/2018  . Hemoglobin A1C  04/09/2019  . Complete foot exam   10/08/2019  . Mammogram  07/23/2020  . Pap Smear  10/30/2021  . Colon Cancer Screening  01/18/2022  . Tetanus Vaccine  01/23/2029  . Flu Shot  Completed  . Pneumococcal vaccine  Completed  .  Hepatitis C: One time screening is recommended by Center for Disease Control  (CDC) for  adults born from [birth year] through 1966.   Completed  . HIV Screening  Completed  . Urine Protein Check  Discontinued    Please restart on blood pressure medication    HYPERTENSION INFORMATION  Monitor your blood pressure at home, please keep a record and bring that in with you to your next office visit.   Go to the ER if any CP, SOB, nausea, dizziness, severe HA, changes vision/speech  Your most recent BP: BP: (!) 150/90   Take your medications faithfully as instructed. Maintain a healthy weight. Get at least 150 minutes of aerobic exercise per week. Minimize salt intake. Minimize alcohol intake  DASH Eating Plan DASH stands for "Dietary Approaches to Stop Hypertension." The DASH eating plan is a healthy eating plan that has been shown to reduce high blood pressure (hypertension). Additional health benefits may include reducing the risk of type 2 diabetes mellitus, heart disease, and stroke. The DASH eating plan may also help with weight loss. WHAT DO I NEED TO KNOW ABOUT THE DASH EATING PLAN? For the DASH eating plan, you will follow these general  guidelines:  Choose foods with a percent daily value for sodium of less than 5% (as listed on the food label).  Use salt-free seasonings or herbs instead of table salt or sea salt.  Check with your health care provider or pharmacist before using salt substitutes.  Eat lower-sodium products, often labeled as "lower sodium" or "no salt added."  Eat fresh foods.  Eat more vegetables, fruits, and low-fat dairy products.  Choose whole grains. Look for the word "whole" as the first word in the ingredient list.  Choose fish and skinless chicken or [place] more often than red meat. Limit fish, poultry, and meat to 6 oz (170 g) each day.  Limit sweets, desserts, sugars, and sugary drinks.  Choose heart-healthy fats.  Limit cheese to 1 oz (28 g) per day.  Eat more home-cooked food and less restaurant, buffet, and fast food.  Limit fried foods.  Cook foods using methods other than frying.  Limit canned vegetables. If you do use them, rinse them well to decrease the sodium.  When eating at a restaurant, ask that your food be prepared with less salt, or no salt if possible. WHAT FOODS CAN I EAT? Seek help from a dietitian for individual calorie needs. Grains Whole grain or whole wheat bread. Brown rice. Whole grain or whole wheat pasta. Quinoa, bulgur, and whole grain cereals. Low-sodium cereals. Corn or  whole wheat flour tortillas. Whole grain cornbread. Whole grain crackers. Low-sodium crackers. Vegetables Fresh or frozen vegetables (raw, steamed, roasted, or grilled). Low-sodium or reduced-sodium tomato and vegetable juices. Low-sodium or reduced-sodium tomato sauce and paste. Low-sodium or reduced-sodium canned vegetables.  Fruits All fresh, canned (in natural juice), or frozen fruits. Meat and Other Protein Products Ground beef (85% or leaner), grass-fed beef, or beef trimmed of fat. Skinless chicken or [place]. Ground chicken or [place]. Pork trimmed of fat. All fish and seafood.  Eggs. Dried beans, peas, or lentils. Unsalted nuts and seeds. Unsalted canned beans. Dairy Low-fat dairy products, such as skim or 1% milk, 2% or reduced-fat cheeses, low-fat ricotta or cottage cheese, or plain low-fat yogurt. Low-sodium or reduced-sodium cheeses. Fats and Oils Tub margarines without trans fats. Light or reduced-fat mayonnaise and salad dressings (reduced sodium). Avocado. Safflower, olive, or canola oils. Natural peanut or almond butter. Other Unsalted popcorn and pretzels. The items listed above may not be a complete list of recommended foods or beverages. Contact your dietitian for more options. WHAT FOODS ARE NOT RECOMMENDED? Grains White bread. White pasta. White rice. Refined cornbread. Bagels and croissants. Crackers that contain trans fat. Vegetables Creamed or fried vegetables. Vegetables in a cheese sauce. Regular canned vegetables. Regular canned tomato sauce and paste. Regular tomato and vegetable juices. Fruits Dried fruits. Canned fruit in light or heavy syrup. Fruit juice. Meat and Other Protein Products Fatty cuts of meat. Ribs, chicken wings, bacon, sausage, bologna, salami, chitterlings, fatback, hot dogs, bratwurst, and packaged luncheon meats. Salted nuts and seeds. Canned beans with salt. Dairy Whole or 2% milk, cream, half-and-half, and cream cheese. Whole-fat or sweetened yogurt. Full-fat cheeses or blue cheese. Nondairy creamers and whipped toppings. Processed cheese, cheese spreads, or cheese curds. Condiments Onion and garlic salt, seasoned salt, table salt, and sea salt. Canned and packaged gravies. Worcestershire sauce. Tartar sauce. Barbecue sauce. Teriyaki sauce. Soy sauce, including reduced sodium. Steak sauce. Fish sauce. Oyster sauce. Cocktail sauce. Horseradish. Ketchup and mustard. Meat flavorings and tenderizers. Bouillon cubes. Hot sauce. Tabasco sauce. Marinades. Taco seasonings. Relishes. Fats and Oils Butter, stick margarine, lard,  shortening, ghee, and bacon fat. Coconut, palm kernel, or palm oils. Regular salad dressings. Other Pickles and olives. Salted popcorn and pretzels. The items listed above may not be a complete list of foods and beverages to avoid. Contact your dietitian for more information. WHERE CAN I FIND MORE INFORMATION? National [employer], [employer], [employer]: [employer]stabl[employer] Document Released: 11/02/2011 Document Revised: 03/30/2014 Document Reviewed: 09/17/2013 [facility] Patient Information 2015 [place], [place]. This information is not intended to replace advice given to you by your health care provider. Make sure you discuss any questions you have with your health care provider.

## 2019-01-24 DIAGNOSIS — N2581 Secondary hyperparathyroidism of renal origin: Secondary | ICD-10-CM | POA: Diagnosis not present

## 2019-01-24 DIAGNOSIS — D509 Iron deficiency anemia, unspecified: Secondary | ICD-10-CM | POA: Diagnosis not present

## 2019-01-24 DIAGNOSIS — D631 Anemia in chronic kidney disease: Secondary | ICD-10-CM | POA: Diagnosis not present

## 2019-01-24 DIAGNOSIS — N186 End stage renal disease: Secondary | ICD-10-CM | POA: Diagnosis not present

## 2019-01-24 DIAGNOSIS — E118 Type 2 diabetes mellitus with unspecified complications: Secondary | ICD-10-CM | POA: Diagnosis not present

## 2019-01-24 LAB — COMPLETE METABOLIC PANEL WITH GFR
AG Ratio: 1.3 (calc) (ref 1.0–2.5)
ALT: 8 U/L (ref 6–29)
AST: 10 U/L (ref 10–35)
Albumin: 4.4 g/dL (ref 3.6–5.1)
Alkaline phosphatase (APISO): 49 U/L (ref 37–153)
BUN/Creatinine Ratio: 5 (calc) — ABNORMAL LOW (ref 6–22)
BUN: 39 mg/dL — ABNORMAL HIGH (ref 7–25)
CO2: 35 mmol/L — ABNORMAL HIGH (ref 20–32)
Calcium: 10.3 mg/dL (ref 8.6–10.4)
Chloride: 93 mmol/L — ABNORMAL LOW (ref 98–110)
Creat: 7.53 mg/dL — ABNORMAL HIGH (ref 0.50–0.99)
GFR, Est African American: 6 mL/min/{1.73_m2} — ABNORMAL LOW (ref >=60)
GFR, Est Non African American: 5 mL/min/{1.73_m2} — ABNORMAL LOW (ref >=60)
Globulin: 3.5 g/dL (calc) (ref 1.9–3.7)
Glucose, Bld: 194 mg/dL — ABNORMAL HIGH (ref 65–99)
Potassium: 4.3 mmol/L (ref 3.5–5.3)
Sodium: 138 mmol/L (ref 135–146)
Total Bilirubin: 0.5 mg/dL (ref 0.2–1.2)
Total Protein: 7.9 g/dL (ref 6.1–8.1)

## 2019-01-24 LAB — LIPID PANEL
Cholesterol: 130 mg/dL (ref <200)
HDL: 42 mg/dL — ABNORMAL LOW (ref >=50)
LDL Cholesterol (Calc): 63 mg/dL (calc)
Non-HDL Cholesterol (Calc): 88 mg/dL (calc) (ref <130)
Total CHOL/HDL Ratio: 3.1 (calc) (ref <5.0)
Triglycerides: 168 mg/dL — ABNORMAL HIGH (ref <150)

## 2019-01-24 LAB — HEMOGLOBIN A1C
Hgb A1c MFr Bld: 6.2 % of total Hgb — ABNORMAL HIGH (ref <5.7)
Mean Plasma Glucose: 131 (calc)
eAG (mmol/L): 7.3 (calc)

## 2019-01-24 LAB — CBC WITH DIFFERENTIAL/PLATELET
Absolute Monocytes: 566 cells/uL (ref 200–950)
Basophils Absolute: 60 cells/uL (ref 0–200)
Basophils Relative: 1.3 %
Eosinophils Absolute: 221 cells/uL (ref 15–500)
Eosinophils Relative: 4.8 %
HCT: 36.5 % (ref 35.0–45.0)
Hemoglobin: 11.6 g/dL — ABNORMAL LOW (ref 11.7–15.5)
Lymphs Abs: 1362 cells/uL (ref 850–3900)
MCH: 27.2 pg (ref 27.0–33.0)
MCHC: 31.8 g/dL — ABNORMAL LOW (ref 32.0–36.0)
MCV: 85.7 fL (ref 80.0–100.0)
MPV: 11.7 fL (ref 7.5–12.5)
Monocytes Relative: 12.3 %
Neutro Abs: 2392 cells/uL (ref 1500–7800)
Neutrophils Relative %: 52 %
Platelets: 252 10*3/uL (ref 140–400)
RBC: 4.26 10*6/uL (ref 3.80–5.10)
RDW: 18.2 % — ABNORMAL HIGH (ref 11.0–15.0)
Total Lymphocyte: 29.6 %
WBC: 4.6 10*3/uL (ref 3.8–10.8)

## 2019-01-24 LAB — TSH: TSH: 2.92 mIU/L (ref 0.40–4.50)

## 2019-01-26 DIAGNOSIS — Z992 Dependence on renal dialysis: Secondary | ICD-10-CM | POA: Diagnosis not present

## 2019-01-26 DIAGNOSIS — N186 End stage renal disease: Secondary | ICD-10-CM | POA: Diagnosis not present

## 2019-01-26 DIAGNOSIS — E1129 Type 2 diabetes mellitus with other diabetic kidney complication: Secondary | ICD-10-CM | POA: Diagnosis not present

## 2019-01-27 DIAGNOSIS — N2581 Secondary hyperparathyroidism of renal origin: Secondary | ICD-10-CM | POA: Diagnosis not present

## 2019-01-27 DIAGNOSIS — E118 Type 2 diabetes mellitus with unspecified complications: Secondary | ICD-10-CM | POA: Diagnosis not present

## 2019-01-27 DIAGNOSIS — D509 Iron deficiency anemia, unspecified: Secondary | ICD-10-CM | POA: Diagnosis not present

## 2019-01-27 DIAGNOSIS — D631 Anemia in chronic kidney disease: Secondary | ICD-10-CM | POA: Diagnosis not present

## 2019-01-27 DIAGNOSIS — N186 End stage renal disease: Secondary | ICD-10-CM | POA: Diagnosis not present

## 2019-01-28 ENCOUNTER — Telehealth: Payer: Self-pay | Admitting: *Deleted

## 2019-01-28 ENCOUNTER — Encounter: Payer: Self-pay | Admitting: *Deleted

## 2019-01-28 NOTE — Telephone Encounter (Signed)
A message was left to remind the patient her ophthalmology appointment was due in 07/2018. Patient was asked to reschedule her diabetic eye exam.

## 2019-01-29 DIAGNOSIS — N186 End stage renal disease: Secondary | ICD-10-CM | POA: Diagnosis not present

## 2019-01-29 DIAGNOSIS — D631 Anemia in chronic kidney disease: Secondary | ICD-10-CM | POA: Diagnosis not present

## 2019-01-29 DIAGNOSIS — N2581 Secondary hyperparathyroidism of renal origin: Secondary | ICD-10-CM | POA: Diagnosis not present

## 2019-01-29 DIAGNOSIS — D509 Iron deficiency anemia, unspecified: Secondary | ICD-10-CM | POA: Diagnosis not present

## 2019-01-29 DIAGNOSIS — E118 Type 2 diabetes mellitus with unspecified complications: Secondary | ICD-10-CM | POA: Diagnosis not present

## 2019-01-31 DIAGNOSIS — N2581 Secondary hyperparathyroidism of renal origin: Secondary | ICD-10-CM | POA: Diagnosis not present

## 2019-01-31 DIAGNOSIS — N186 End stage renal disease: Secondary | ICD-10-CM | POA: Diagnosis not present

## 2019-01-31 DIAGNOSIS — D631 Anemia in chronic kidney disease: Secondary | ICD-10-CM | POA: Diagnosis not present

## 2019-01-31 DIAGNOSIS — D509 Iron deficiency anemia, unspecified: Secondary | ICD-10-CM | POA: Diagnosis not present

## 2019-01-31 DIAGNOSIS — E118 Type 2 diabetes mellitus with unspecified complications: Secondary | ICD-10-CM | POA: Diagnosis not present

## 2019-02-03 DIAGNOSIS — D631 Anemia in chronic kidney disease: Secondary | ICD-10-CM | POA: Diagnosis not present

## 2019-02-03 DIAGNOSIS — N186 End stage renal disease: Secondary | ICD-10-CM | POA: Diagnosis not present

## 2019-02-03 DIAGNOSIS — E118 Type 2 diabetes mellitus with unspecified complications: Secondary | ICD-10-CM | POA: Diagnosis not present

## 2019-02-03 DIAGNOSIS — N2581 Secondary hyperparathyroidism of renal origin: Secondary | ICD-10-CM | POA: Diagnosis not present

## 2019-02-03 DIAGNOSIS — D509 Iron deficiency anemia, unspecified: Secondary | ICD-10-CM | POA: Diagnosis not present

## 2019-02-05 DIAGNOSIS — N2581 Secondary hyperparathyroidism of renal origin: Secondary | ICD-10-CM | POA: Diagnosis not present

## 2019-02-05 DIAGNOSIS — D509 Iron deficiency anemia, unspecified: Secondary | ICD-10-CM | POA: Diagnosis not present

## 2019-02-05 DIAGNOSIS — E118 Type 2 diabetes mellitus with unspecified complications: Secondary | ICD-10-CM | POA: Diagnosis not present

## 2019-02-05 DIAGNOSIS — N186 End stage renal disease: Secondary | ICD-10-CM | POA: Diagnosis not present

## 2019-02-05 DIAGNOSIS — D631 Anemia in chronic kidney disease: Secondary | ICD-10-CM | POA: Diagnosis not present

## 2019-02-07 DIAGNOSIS — D509 Iron deficiency anemia, unspecified: Secondary | ICD-10-CM | POA: Diagnosis not present

## 2019-02-07 DIAGNOSIS — E118 Type 2 diabetes mellitus with unspecified complications: Secondary | ICD-10-CM | POA: Diagnosis not present

## 2019-02-07 DIAGNOSIS — D631 Anemia in chronic kidney disease: Secondary | ICD-10-CM | POA: Diagnosis not present

## 2019-02-07 DIAGNOSIS — N186 End stage renal disease: Secondary | ICD-10-CM | POA: Diagnosis not present

## 2019-02-07 DIAGNOSIS — N2581 Secondary hyperparathyroidism of renal origin: Secondary | ICD-10-CM | POA: Diagnosis not present

## 2019-02-10 DIAGNOSIS — D631 Anemia in chronic kidney disease: Secondary | ICD-10-CM | POA: Diagnosis not present

## 2019-02-10 DIAGNOSIS — E118 Type 2 diabetes mellitus with unspecified complications: Secondary | ICD-10-CM | POA: Diagnosis not present

## 2019-02-10 DIAGNOSIS — N186 End stage renal disease: Secondary | ICD-10-CM | POA: Diagnosis not present

## 2019-02-10 DIAGNOSIS — D509 Iron deficiency anemia, unspecified: Secondary | ICD-10-CM | POA: Diagnosis not present

## 2019-02-10 DIAGNOSIS — N2581 Secondary hyperparathyroidism of renal origin: Secondary | ICD-10-CM | POA: Diagnosis not present

## 2019-02-12 DIAGNOSIS — D509 Iron deficiency anemia, unspecified: Secondary | ICD-10-CM | POA: Diagnosis not present

## 2019-02-12 DIAGNOSIS — N186 End stage renal disease: Secondary | ICD-10-CM | POA: Diagnosis not present

## 2019-02-12 DIAGNOSIS — E118 Type 2 diabetes mellitus with unspecified complications: Secondary | ICD-10-CM | POA: Diagnosis not present

## 2019-02-12 DIAGNOSIS — D631 Anemia in chronic kidney disease: Secondary | ICD-10-CM | POA: Diagnosis not present

## 2019-02-12 DIAGNOSIS — N2581 Secondary hyperparathyroidism of renal origin: Secondary | ICD-10-CM | POA: Diagnosis not present

## 2019-02-14 DIAGNOSIS — D509 Iron deficiency anemia, unspecified: Secondary | ICD-10-CM | POA: Diagnosis not present

## 2019-02-14 DIAGNOSIS — E118 Type 2 diabetes mellitus with unspecified complications: Secondary | ICD-10-CM | POA: Diagnosis not present

## 2019-02-14 DIAGNOSIS — D631 Anemia in chronic kidney disease: Secondary | ICD-10-CM | POA: Diagnosis not present

## 2019-02-14 DIAGNOSIS — N186 End stage renal disease: Secondary | ICD-10-CM | POA: Diagnosis not present

## 2019-02-14 DIAGNOSIS — N2581 Secondary hyperparathyroidism of renal origin: Secondary | ICD-10-CM | POA: Diagnosis not present

## 2019-02-17 DIAGNOSIS — D631 Anemia in chronic kidney disease: Secondary | ICD-10-CM | POA: Diagnosis not present

## 2019-02-17 DIAGNOSIS — N186 End stage renal disease: Secondary | ICD-10-CM | POA: Diagnosis not present

## 2019-02-17 DIAGNOSIS — D509 Iron deficiency anemia, unspecified: Secondary | ICD-10-CM | POA: Diagnosis not present

## 2019-02-17 DIAGNOSIS — N2581 Secondary hyperparathyroidism of renal origin: Secondary | ICD-10-CM | POA: Diagnosis not present

## 2019-02-17 DIAGNOSIS — E118 Type 2 diabetes mellitus with unspecified complications: Secondary | ICD-10-CM | POA: Diagnosis not present

## 2019-02-18 DIAGNOSIS — Z794 Long term (current) use of insulin: Secondary | ICD-10-CM | POA: Diagnosis not present

## 2019-02-18 DIAGNOSIS — I120 Hypertensive chronic kidney disease with stage 5 chronic kidney disease or end stage renal disease: Secondary | ICD-10-CM | POA: Diagnosis not present

## 2019-02-18 DIAGNOSIS — E1122 Type 2 diabetes mellitus with diabetic chronic kidney disease: Secondary | ICD-10-CM | POA: Diagnosis not present

## 2019-02-18 DIAGNOSIS — E669 Obesity, unspecified: Secondary | ICD-10-CM | POA: Diagnosis not present

## 2019-02-18 DIAGNOSIS — N185 Chronic kidney disease, stage 5: Secondary | ICD-10-CM | POA: Diagnosis not present

## 2019-02-18 DIAGNOSIS — Z6837 Body mass index (BMI) 37.0-37.9, adult: Secondary | ICD-10-CM | POA: Diagnosis not present

## 2019-02-18 DIAGNOSIS — Z8673 Personal history of transient ischemic attack (TIA), and cerebral infarction without residual deficits: Secondary | ICD-10-CM | POA: Diagnosis not present

## 2019-02-18 DIAGNOSIS — I5032 Chronic diastolic (congestive) heart failure: Secondary | ICD-10-CM | POA: Diagnosis not present

## 2019-02-18 DIAGNOSIS — Z992 Dependence on renal dialysis: Secondary | ICD-10-CM | POA: Diagnosis not present

## 2019-02-19 DIAGNOSIS — D509 Iron deficiency anemia, unspecified: Secondary | ICD-10-CM | POA: Diagnosis not present

## 2019-02-19 DIAGNOSIS — D631 Anemia in chronic kidney disease: Secondary | ICD-10-CM | POA: Diagnosis not present

## 2019-02-19 DIAGNOSIS — E118 Type 2 diabetes mellitus with unspecified complications: Secondary | ICD-10-CM | POA: Diagnosis not present

## 2019-02-19 DIAGNOSIS — N2581 Secondary hyperparathyroidism of renal origin: Secondary | ICD-10-CM | POA: Diagnosis not present

## 2019-02-19 DIAGNOSIS — N186 End stage renal disease: Secondary | ICD-10-CM | POA: Diagnosis not present

## 2019-02-21 DIAGNOSIS — D509 Iron deficiency anemia, unspecified: Secondary | ICD-10-CM | POA: Diagnosis not present

## 2019-02-21 DIAGNOSIS — N2581 Secondary hyperparathyroidism of renal origin: Secondary | ICD-10-CM | POA: Diagnosis not present

## 2019-02-21 DIAGNOSIS — N186 End stage renal disease: Secondary | ICD-10-CM | POA: Diagnosis not present

## 2019-02-21 DIAGNOSIS — D631 Anemia in chronic kidney disease: Secondary | ICD-10-CM | POA: Diagnosis not present

## 2019-02-21 DIAGNOSIS — E118 Type 2 diabetes mellitus with unspecified complications: Secondary | ICD-10-CM | POA: Diagnosis not present

## 2019-02-23 ENCOUNTER — Encounter (HOSPITAL_COMMUNITY): Payer: Self-pay | Admitting: Radiology

## 2019-02-23 ENCOUNTER — Emergency Department (HOSPITAL_COMMUNITY): Payer: Medicare Other

## 2019-02-23 ENCOUNTER — Emergency Department (HOSPITAL_COMMUNITY)
Admission: EM | Admit: 2019-02-23 | Discharge: 2019-02-23 | Disposition: A | Discharge status: Home / Self Care | Payer: Medicare Other | Attending: Emergency Medicine | Admitting: Emergency Medicine

## 2019-02-23 ENCOUNTER — Other Ambulatory Visit: Payer: Self-pay

## 2019-02-23 DIAGNOSIS — Z79899 Other long term (current) drug therapy: Secondary | ICD-10-CM | POA: Diagnosis not present

## 2019-02-23 DIAGNOSIS — R109 Unspecified abdominal pain: Secondary | ICD-10-CM | POA: Diagnosis not present

## 2019-02-23 DIAGNOSIS — I5032 Chronic diastolic (congestive) heart failure: Secondary | ICD-10-CM | POA: Diagnosis not present

## 2019-02-23 DIAGNOSIS — N2889 Other specified disorders of kidney and ureter: Secondary | ICD-10-CM | POA: Diagnosis not present

## 2019-02-23 DIAGNOSIS — R1084 Generalized abdominal pain: Secondary | ICD-10-CM | POA: Diagnosis present

## 2019-02-23 DIAGNOSIS — E1122 Type 2 diabetes mellitus with diabetic chronic kidney disease: Secondary | ICD-10-CM | POA: Diagnosis not present

## 2019-02-23 DIAGNOSIS — I132 Hypertensive heart and chronic kidney disease with heart failure and with stage 5 chronic kidney disease, or end stage renal disease: Secondary | ICD-10-CM | POA: Diagnosis not present

## 2019-02-23 DIAGNOSIS — Z8673 Personal history of transient ischemic attack (TIA), and cerebral infarction without residual deficits: Secondary | ICD-10-CM | POA: Diagnosis not present

## 2019-02-23 DIAGNOSIS — N186 End stage renal disease: Secondary | ICD-10-CM | POA: Insufficient documentation

## 2019-02-23 DIAGNOSIS — E785 Hyperlipidemia, unspecified: Secondary | ICD-10-CM | POA: Insufficient documentation

## 2019-02-23 DIAGNOSIS — I4891 Unspecified atrial fibrillation: Secondary | ICD-10-CM | POA: Diagnosis not present

## 2019-02-23 DIAGNOSIS — Z794 Long term (current) use of insulin: Secondary | ICD-10-CM | POA: Diagnosis not present

## 2019-02-23 DIAGNOSIS — N1330 Unspecified hydronephrosis: Secondary | ICD-10-CM | POA: Diagnosis not present

## 2019-02-23 DIAGNOSIS — Z992 Dependence on renal dialysis: Secondary | ICD-10-CM | POA: Diagnosis not present

## 2019-02-23 DIAGNOSIS — Z7982 Long term (current) use of aspirin: Secondary | ICD-10-CM | POA: Diagnosis not present

## 2019-02-23 DIAGNOSIS — N10 Acute tubulo-interstitial nephritis: Secondary | ICD-10-CM

## 2019-02-23 DIAGNOSIS — R112 Nausea with vomiting, unspecified: Secondary | ICD-10-CM | POA: Insufficient documentation

## 2019-02-23 DIAGNOSIS — R1011 Right upper quadrant pain: Secondary | ICD-10-CM | POA: Diagnosis not present

## 2019-02-23 LAB — CBC WITH DIFFERENTIAL/PLATELET
Abs Immature Granulocytes: 0.02 10*3/uL (ref 0.00–0.07)
Basophils Absolute: 0 10*3/uL (ref 0.0–0.1)
Basophils Relative: 1 %
Eosinophils Absolute: 0.1 10*3/uL (ref 0.0–0.5)
Eosinophils Relative: 2 %
HCT: 34.9 % — ABNORMAL LOW (ref 36.0–46.0)
Hemoglobin: 10.5 g/dL — ABNORMAL LOW (ref 12.0–15.0)
Immature Granulocytes: 0 %
Lymphocytes Relative: 14 %
Lymphs Abs: 0.9 10*3/uL (ref 0.7–4.0)
MCH: 25.6 pg — ABNORMAL LOW (ref 26.0–34.0)
MCHC: 30.1 g/dL (ref 30.0–36.0)
MCV: 85.1 fL (ref 80.0–100.0)
Monocytes Absolute: 0.5 10*3/uL (ref 0.1–1.0)
Monocytes Relative: 8 %
Neutro Abs: 4.7 10*3/uL (ref 1.7–7.7)
Neutrophils Relative %: 75 %
Platelets: 218 10*3/uL (ref 150–400)
RBC: 4.1 MIL/uL (ref 3.87–5.11)
RDW: 19.9 % — ABNORMAL HIGH (ref 11.5–15.5)
WBC: 6.3 10*3/uL (ref 4.0–10.5)
nRBC: 0 % (ref 0.0–0.2)

## 2019-02-23 LAB — COMPREHENSIVE METABOLIC PANEL
ALT: 17 U/L (ref 0–44)
AST: 16 U/L (ref 15–41)
Albumin: 4.3 g/dL (ref 3.5–5.0)
Alkaline Phosphatase: 40 U/L (ref 38–126)
Anion gap: 14 (ref 5–15)
BUN: 46 mg/dL — ABNORMAL HIGH (ref 8–23)
CO2: 25 mmol/L (ref 22–32)
Calcium: 9.4 mg/dL (ref 8.9–10.3)
Chloride: 97 mmol/L — ABNORMAL LOW (ref 98–111)
Creatinine, Ser: 8.29 mg/dL — ABNORMAL HIGH (ref 0.44–1.00)
GFR calc Af Amer: 5 mL/min — ABNORMAL LOW (ref >60)
GFR calc non Af Amer: 5 mL/min — ABNORMAL LOW (ref >60)
Glucose, Bld: 174 mg/dL — ABNORMAL HIGH (ref 70–99)
Potassium: 3.7 mmol/L (ref 3.5–5.1)
Sodium: 136 mmol/L (ref 135–145)
Total Bilirubin: 0.8 mg/dL (ref 0.3–1.2)
Total Protein: 8 g/dL (ref 6.5–8.1)

## 2019-02-23 LAB — URINALYSIS, ROUTINE W REFLEX MICROSCOPIC
Bilirubin Urine: NEGATIVE
Glucose, UA: 50 mg/dL — AB
Ketones, ur: NEGATIVE mg/dL
Nitrite: NEGATIVE
Protein, ur: 100 mg/dL — AB
Specific Gravity, Urine: 1.013 (ref 1.005–1.030)
pH: 8 (ref 5.0–8.0)

## 2019-02-23 LAB — LIPASE, BLOOD: Lipase: 39 U/L (ref 11–51)

## 2019-02-23 MED ORDER — CARVEDILOL 12.5 MG PO TABS
6.2500 mg | ORAL_TABLET | Freq: Once | ORAL | Status: AC
  Administered 2019-02-23: 6.25 mg via ORAL

## 2019-02-23 MED ORDER — FENTANYL CITRATE (PF) 100 MCG/2ML IJ SOLN
50.0000 ug | Freq: Once | INTRAMUSCULAR | Status: AC
  Administered 2019-02-23: 50 ug via INTRAVENOUS
  Filled 2019-02-23: qty 2

## 2019-02-23 MED ORDER — CARVEDILOL 12.5 MG PO TABS
6.2500 mg | ORAL_TABLET | Freq: Two times a day (BID) | ORAL | Status: DC
  Filled 2019-02-23: qty 1

## 2019-02-23 MED ORDER — ONDANSETRON 4 MG PO TBDP: 4.0000 mg | ORAL_TABLET | Freq: Three times a day (TID) | ORAL | 0 refills | Status: AC | PRN

## 2019-02-23 MED ORDER — DIPHENHYDRAMINE HCL 50 MG/ML IJ SOLN
25.0000 mg | Freq: Once | INTRAMUSCULAR | Status: AC
  Administered 2019-02-23: 25 mg via INTRAVENOUS
  Filled 2019-02-23: qty 1

## 2019-02-23 MED ORDER — ONDANSETRON HCL 4 MG/2ML IJ SOLN
4.0000 mg | Freq: Once | INTRAMUSCULAR | Status: AC
  Administered 2019-02-23: 4 mg via INTRAVENOUS
  Filled 2019-02-23: qty 2

## 2019-02-23 MED ORDER — IOHEXOL 300 MG/ML  SOLN
100.0000 mL | Freq: Once | INTRAMUSCULAR | Status: AC | PRN
  Administered 2019-02-23: 100 mL via INTRAVENOUS

## 2019-02-23 MED ORDER — SODIUM CHLORIDE 0.9 % IV SOLN
1.0000 g | Freq: Once | INTRAVENOUS | Status: AC
  Administered 2019-02-23: 1 g via INTRAVENOUS
  Filled 2019-02-23: qty 10

## 2019-02-23 MED ORDER — FENTANYL CITRATE (PF) 100 MCG/2ML IJ SOLN
50.0000 ug | Freq: Once | INTRAMUSCULAR | Status: DC
  Filled 2019-02-23: qty 2

## 2019-02-23 MED ORDER — MORPHINE SULFATE (PF) 4 MG/ML IV SOLN
4.0000 mg | Freq: Once | INTRAVENOUS | Status: AC
  Administered 2019-02-23: 4 mg via INTRAVENOUS
  Filled 2019-02-23: qty 1

## 2019-02-23 MED ORDER — AMOXICILLIN-POT CLAVULANATE 875-125 MG PO TABS: 1.0000 | ORAL_TABLET | Freq: Two times a day (BID) | ORAL | 0 refills | Status: AC

## 2019-02-23 NOTE — ED Notes (Signed)
Pt ambulated with steady gait to the bathroom.

## 2019-02-23 NOTE — ED Notes (Signed)
Patient verbalizes understanding of medications and discharge instructions. No further questions at this time. VSS and patient ambulatory at discharge.   

## 2019-02-23 NOTE — ED Provider Notes (Signed)
[facility] EMERGENCY DEPARTMENT Provider Note   CSN: [id number] Arrival date & time: 02/23/19  1543    History   Chief Complaint Chief Complaint  Patient presents with   Abdominal Pain    HPI Jody Nguyen is a 63 y.o. female with history of CKD on dialysis Monday Wednesday Friday, CHF, HLD, HTN, pulmonary hypertension, CVA with no residual deficits presents for evaluation of acute onset, intermittent right-sided abdominal pain since around 11 AM this morning.  She reports that symptoms began after eating her breakfast which consisted of sausage.  Pain has been sharp at times, radiates from the right upper quadrant down.  It worsens with emesis and sometimes ambulation.  She has had 3-4 episodes of nonbloody nonbilious emesis.  Has not had a bowel movement since yesterday.  Denies urinary symptoms and she does still produce urine.  Denies fever, chills, cough, chest pain, melena, hematochezia, or diarrhea.  She does note a little bit of shortness of breath when she ambulates due to worsening pain.  Has taken a laxative today without relief of symptoms.  Denies recent travel or any known sick contacts, denies suspicious food intake.  No recent treatment with antibiotics.  Has had 2 C-sections previously but no other abdominal surgeries.  Last had dialysis on Friday, received the full treatment, has not had any missed dialysis recently.  Has been on dialysis for 4 years.     The history is provided by the patient.    Past Medical History:  Diagnosis Date   Anemia    Chronic diastolic CHF (congestive heart failure) (HCC)    Chronic diastolic heart failure (HCC)    CKD (chronic kidney disease) stage 4, GFR 15-29 ml/min (HCC)    dialysis M/W/F   Constipation    CVA (cerebral infarction) 1997   no residual deficit   Diverticulitis    DM (diabetes mellitus) (HCC)    type 2   Heart murmur    History of cardiovascular stress test    a. Myoview Oct 2012  showed EF 49%, no ischemia, LVE   HLD (hyperlipidemia)    takes Crestor daily   Hypertension    Pericardial effusion    chronic; felt to be poss related to minoxidil >> DC'd   Pulmonary hypertension ([place]) 06/18/2015   Respiratory failure, acute hypoxic, post-operative 07/08/2015   Requiring ECMO support   S/P cardiac catheterization    a. R/L [facility] 06/18/15:  mLAD 30%; severe pulmo HTN with PA sat 43%, CI 1.86, prominent V waves indicative of MR; resting hypoxemia O2 sat 86% on RA   S/P minimally invasive mitral valve repair 07/08/2015   Complex valvuloplasty including artificial Gore-tex neochord placement x10 and 26 mm Sorin Memo 3D Rechord ring annuloplasty via right mini thoracotomy approach   Severe mitral regurgitation    Shortness of breath dyspnea    Stroke ([place]) 1997   no residual effect   Vitamin D deficiency    Wears glasses     Patient Active Problem List   Diagnosis Date Noted   Major depression in remission ([place]) 01/22/2019   ESRD on dialysis ([place]) 10/09/2015   Cardiomyopathy ([place]) 09/10/2015   Atrial fibrillation ([place]) 09/10/2015   S/P minimally invasive mitral valve repair 07/08/2015   Chronic periodontitis 06/30/2015   Chronic diastolic heart failure ([place])    Pulmonary hypertension ([place]) 06/18/2015   Morbid obesity (BMI 36.05)  03/29/2015   Insulin dependent diabetes with renal manifestation ([place]) 11/22/2014   Vitamin D  deficiency 03/25/2014   Anemia in chronic kidney disease    Essential hypertension 08/17/2011   Hyperlipidemia associated with type 2 diabetes mellitus ([place]) 08/17/2011    Past Surgical History:  Procedure Laterality Date   A/V FISTULAGRAM Right 08/16/2017   Procedure: A/V Fistulagram;  Surgeon: Conrad , MD;  Location: [place] CV LAB;  Service: Cardiovascular;  Laterality: Right;   AV FISTULA PLACEMENT Left 10/22/2015   Procedure: RADIOCEPHALIC ARTERIOVENOUS (AV) FISTULA CREATION;  Surgeon: Serafina Mitchell, MD;   Location: [place] OR;  Service: Vascular;  Laterality: Left;   AV FISTULA PLACEMENT Right 05/22/2017   Procedure: ARTERIOVENOUS (AV) FISTULA CREATION-RIGHT ARM;  Surgeon: Waynetta Sandy, MD;  Location: [place];  Service: Vascular;  Laterality: Right;   CANNULATION FOR CARDIOPULMONARY BYPASS N/A 07/08/2015   Procedure: CANNULATION FOR ECMO;  Surgeon: Rexene Alberts, MD;  Location: [place];  Service: Open Heart Surgery;  Laterality: N/A;   CARDIAC CATHETERIZATION     CARDIAC CATHETERIZATION N/A 06/18/2015   Procedure: Right/Left Heart Cath and Coronary Angiography;  Surgeon: Jettie Booze, MD;  Location: [place] CV LAB;  Service: Cardiovascular;  Laterality: N/A;   CESAREAN SECTION     x 2   COLONOSCOPY     EXCHANGE OF A DIALYSIS CATHETER N/A 06/28/2017   Procedure: EXCHANGE OF A DIALYSIS CATHETER;  Surgeon: Waynetta Sandy, MD;  Location: [place];  Service: Vascular;  Laterality: N/A;   FISTULA SUPERFICIALIZATION Left 12/28/2015   Procedure: SUPERFICIALIZATION LEFT RADIOCEPHALIC FISTULA;  Surgeon: Serafina Mitchell, MD;  Location: [place];  Service: Vascular;  Laterality: Left;   FISTULA SUPERFICIALIZATION Right 08/21/2017   Procedure: FISTULA SUPERFICIALIZATION RIGHT ARM;  Surgeon: Waynetta Sandy, MD;  Location: [place];  Service: Vascular;  Laterality: Right;   INSERTION OF DIALYSIS CATHETER Right 04/28/2017   Procedure: INSERTION OF DIALYSIS CATHETER-RIGHT INTERNAL JUGULAR PLACEMENT;  Surgeon: Elam Dutch, MD;  Location: [place];  Service: Vascular;  Laterality: Right;   LIGATION OF ARTERIOVENOUS  FISTULA Left 04/28/2017   Procedure: LIGATION OF ARTERIOVENOUS  FISTULA;  Surgeon: Elam Dutch, MD;  Location: [place];  Service: Vascular;  Laterality: Left;   MITRAL VALVE REPAIR Right 07/08/2015   Procedure: MINIMALLY INVASIVE MITRAL VALVE REPAIR with a 26 Sorin Memo 3D Rechord;  Surgeon: Rexene Alberts, MD;  Location: [place];  Service: Open Heart Surgery;   Laterality: Right;   MULTIPLE EXTRACTIONS WITH ALVEOLOPLASTY N/A 06/30/2015   Procedure: MULTIPLE EXTRACTIONS OF TOOTH #'S 4  AND 30 WITH ALVEOLOPLASTY AND GROSS DEBRIDEMENT  OF REMAINING TEETH;  Surgeon: Lenn Cal, DDS;  Location: [place];  Service: Oral Surgery;  Laterality: N/A;   PERIPHERAL VASCULAR CATHETERIZATION Left 03/28/2016   Procedure: A/V Fistulagram;  Surgeon: Serafina Mitchell, MD;  Location: [place] CV LAB;  Service: Cardiovascular;  Laterality: Left;  lower arm   TEE WITHOUT CARDIOVERSION N/A 06/08/2015   Procedure: TRANSESOPHAGEAL ECHOCARDIOGRAM (TEE);  Surgeon: Lelon Perla, MD;  Location: [facility] ENDOSCOPY;  Service: Cardiovascular;  Laterality: N/A;   TEE WITHOUT CARDIOVERSION N/A 07/08/2015   Procedure: TRANSESOPHAGEAL ECHOCARDIOGRAM (TEE);  Surgeon: Rexene Alberts, MD;  Location: [place];  Service: Open Heart Surgery;  Laterality: N/A;     OB History    Gravida  3   Para  3   Term  3   Preterm      AB      Living        SAB  TAB      Ectopic      Multiple      Live Births               Home Medications    Prior to Admission medications   Medication Sig Start Date End Date Taking? Authorizing Provider  acetaminophen (TYLENOL) 500 MG tablet Take 1,000 mg by mouth every 6 (six) hours as needed (for pain.).   Yes [provider]  aspirin EC 81 MG tablet Take 81 mg by mouth daily.   Yes [provider]  busPIRone (BUSPAR) 10 MG tablet TAKE 1 TABLET BY MOUTH TWICE DAILY Patient taking differently: Take 10 mg by mouth 2 (two) times daily.  11/24/18  Yes Unk Pinto, MD  carvedilol (COREG) 6.25 MG tablet Take 1 tablet (6.25 mg total) by mouth 2 (two) times daily. 12/28/17 02/23/19 Yes Unk Pinto, MD  cinacalcet (SENSIPAR) 60 MG tablet Take 1 tablet (60 mg total) by mouth daily. 09/11/17  Yes Unk Pinto, MD  insulin aspart protamine - aspart (NOVOLOG MIX 70/30 FLEXPEN) (70-30) 100 UNIT/ML FlexPen Inject 0.1 mLs  (10 Units total) into the skin 2 (two) times daily. Patient taking differently: Inject 10 Units into the skin 2 (two) times daily as needed (if blood sugar is above 180).  03/06/17  Yes Unk Pinto, MD  latanoprost (XALATAN) 0.005 % ophthalmic solution Place 1 drop into both eyes daily. 09/16/18  Yes [provider]  RENVELA 800 MG tablet Take 1,600-4,000 mg by mouth 3 (three) times daily. 4000 mg (5 caps) with meals and 1600 mg (2 caps) with snacks 12/21/15  Yes [provider]  rosuvastatin (CRESTOR) 20 MG tablet TAKE 1 TABLET BY MOUTH ONCE DAILY Patient taking differently: Take 20 mg by mouth daily.  12/24/18  Yes Unk Pinto, MD  sertraline (ZOLOFT) 25 MG tablet TAKE 1 TABLET BY MOUTH ONCE DAILY Patient taking differently: Take 25 mg by mouth daily.  09/09/18  Yes Unk Pinto, MD  amoxicillin-clavulanate (AUGMENTIN) 875-125 MG tablet Take 1 tablet by mouth every 12 (twelve) hours for 10 days. 02/23/19 03/05/19  Rodell Perna A, PA-C  azelastine (ASTELIN) 0.1 % nasal spray Use 1 to 2 sprays each nostril 2 to 3 x / day Patient not taking: Reported on 01/23/2019 03/26/18   Unk Pinto, MD  ondansetron (ZOFRAN ODT) 4 MG disintegrating tablet Take 1 tablet (4 mg total) by mouth every 8 (eight) hours as needed for nausea or vomiting. 02/23/19   Renita Papa, PA-C    Family History Family History  Problem Relation Age of Onset   Cancer Sister        breast cancer   Stroke Mother    Heart attack Brother        MI in his 73s   Heart disease Brother        before age 91   Breast cancer Sister     Social History Social History   Tobacco Use   Smoking status: Never Smoker   Smokeless tobacco: Never Used  Substance Use Topics   Alcohol use: No    Alcohol/week: 0.0 standard drinks   Drug use: No     Allergies   Minoxidil; Lipitor [atorvastatin]; Morphine and related; and Ace inhibitors   Review of Systems Review of Systems  Constitutional:  Negative for chills and fever.  Respiratory: Positive for shortness of breath (with walking only, secondary to abd pain). Negative for cough.   Cardiovascular: Negative for chest pain.  Gastrointestinal: Positive for abdominal pain, constipation, nausea and vomiting. Negative for diarrhea.  Genitourinary: Negative for dysuria and hematuria.  All other systems reviewed and are negative.    Physical Exam Updated Vital Signs BP (!) 181/111    Pulse 95    Temp 98.1 F (36.7 C) (Oral)    Resp 16    SpO2 100%   Physical Exam Vitals signs and nursing note reviewed.  Constitutional:      General: She is not in acute distress.    Appearance: She is well-developed. She is obese.  HENT:     Head: Normocephalic and atraumatic.  Eyes:     General:        Right eye: No discharge.        Left eye: No discharge.     Conjunctiva/sclera: Conjunctivae normal.  Neck:     Vascular: No JVD.     Trachea: No tracheal deviation.  Cardiovascular:     Rate and Rhythm: Normal rate and regular rhythm.     Comments: Dialysis fistula in the right upper extremity with palpable thrill.  No pitting edema of the lower extremities. Pulmonary:     Effort: Pulmonary effort is normal. No respiratory distress.     Breath sounds: Normal breath sounds.  Abdominal:     General: Abdomen is protuberant. A surgical scar is present. Bowel sounds are decreased. There is no distension.     Palpations: Abdomen is soft.     Tenderness: There is abdominal tenderness in the right upper quadrant, right lower quadrant, epigastric area and left upper quadrant. There is right CVA tenderness and left CVA tenderness. There is no rebound. Negative signs include Murphy's sign and Rovsing's sign.     Comments: Maximally tender to palpation in the right upper quadrant  Skin:    General: Skin is warm and dry.     Findings: No erythema.  Neurological:     Mental Status: She is alert.  Psychiatric:        Behavior: Behavior normal.       ED Treatments / Results  Labs (all labs ordered are listed, but only abnormal results are displayed) Labs Reviewed  CBC WITH DIFFERENTIAL/PLATELET - Abnormal; Notable for the following components:      Result Value   Hemoglobin 10.5 (*)    HCT 34.9 (*)    MCH 25.6 (*)    RDW 19.9 (*)    All other components within normal limits  COMPREHENSIVE METABOLIC PANEL - Abnormal; Notable for the following components:   Chloride 97 (*)    Glucose, Bld 174 (*)    BUN 46 (*)    Creatinine, Ser 8.29 (*)    GFR calc non Af Amer 5 (*)    GFR calc Af Amer 5 (*)    All other components within normal limits  URINALYSIS, ROUTINE W REFLEX MICROSCOPIC - Abnormal; Notable for the following components:   APPearance CLOUDY (*)    Glucose, UA 50 (*)    Hgb urine dipstick LARGE (*)    Protein, ur 100 (*)    Leukocytes,Ua LARGE (*)    Bacteria, UA MANY (*)    All other components within normal limits  URINE CULTURE  LIPASE, BLOOD    EKG None  Radiology Ct Abdomen Pelvis W Contrast  Result Date: 02/23/2019 CLINICAL DATA:  Right abdominal pain starting this morning. Vomiting. EXAM: CT ABDOMEN AND PELVIS WITH CONTRAST TECHNIQUE: Multidetector CT imaging of the abdomen and pelvis was performed using  the standard protocol following bolus administration of intravenous contrast. CONTRAST:  161mL OMNIPAQUE IOHEXOL 300 MG/ML  SOLN COMPARISON:  07/01/2015 FINDINGS: Lower chest: Moderate cardiomegaly. Prosthetic mitral valve. Coronary atherosclerosis. Mosaic attenuation at the lung bases with possible vascular malformation or calcification in the right lower lobe along the hemidiaphragm. Hepatobiliary: Unremarkable Pancreas: Unremarkable Spleen: Unremarkable Adrenals/Urinary Tract: Both adrenal glands appear normal. Mildly atrophic left kidney. Right kidney lower pole cyst posteriorly similar to the prior exam. Smaller cysts in the left kidney. There is moderate right hydronephrosis and moderate right  hydroureter with delayed excretion from the right kidney. The right hydroureter extends down into the pelvis to the UVJ, without a well-defined stone or cause for obstruction. There are adjacent vascular phleboliths. I do not see an obvious enhancing mass in the urinary bladder. Stomach/Bowel: Scattered diverticula of the descending colon. Vascular/Lymphatic: Aortoiliac atherosclerotic vascular disease. Small pelvic lymph nodes are not pathologically enlarged by size criteria. Reproductive: Unremarkable Other: No supplemental non-categorized findings. Musculoskeletal: Lower lumbar spondylosis and degenerative disc disease. Umbilical hernia contains adipose tissue. IMPRESSION: 1. Moderate right hydronephrosis and right hydroureter extending down to the UVJ, without a well-defined stone. The cause for the presumed obstruction is uncertain. Delayed excretion from the right kidney on the late phase images. 2. Moderate cardiomegaly. Prosthetic mitral valve. Coronary atherosclerosis. 3. Mosaic attenuation at the lung bases is nonspecific and could be from air trapping or low-grade edema. 4. Branching density along the right hemidiaphragm, possibly a postinflammatory calcification or less likely small vascular malformation. 5. Mildly atrophic left kidney. 6.  Aortic Atherosclerosis (ICD10-I70.0). 7. Lower lumbar spondylosis and degenerative disc disease. Electronically Signed   By: Van Clines M.D.   On: 02/23/2019 17:12    Procedures Procedures (including critical care time)  Medications Ordered in ED Medications  morphine 4 MG/ML injection 4 mg (4 mg Intravenous Given 02/23/19 1611)  ondansetron (ZOFRAN) injection 4 mg (4 mg Intravenous Given 02/23/19 1611)  diphenhydrAMINE (BENADRYL) injection 25 mg (25 mg Intravenous Given 02/23/19 1624)  iohexol (OMNIPAQUE) 300 MG/ML solution 100 mL (100 mLs Intravenous Contrast Given 02/23/19 1648)  fentaNYL (SUBLIMAZE) injection 50 mcg (50 mcg Intravenous Given  02/23/19 1816)  cefTRIAXone (ROCEPHIN) 1 g in sodium chloride 0.9 % 100 mL IVPB (0 g Intravenous Stopped 02/23/19 1937)     Initial Impression / Assessment and Plan / ED Course  I have reviewed the triage vital signs and the nursing notes.  Pertinent labs & imaging results that were available during my care of the patient were reviewed by me and considered in my medical decision making (see chart for details).        Patient presenting for evaluation of right-sided abdominal pain and right flank pain beginning this morning.  She is afebrile, persistently hypertensive in the ED but was not able to tolerate any of her p.o. home medicines this morning including her antihypertensives.  She is uncomfortable but nontoxic in appearance.  No peritoneal signs on examination of the abdomen.  Lab work reviewed by me shows no leukocytosis, stable H&H.  BUN and creatinine are elevated but this is not unexpected in a dialysis patient.  She has been compliant with her dialysis treatments and there is no indication for emergent dialysis at this time.  CT of the abdomen and pelvis shows right hydronephrosis with hydroureter extending down to the UVJ with no definitive evidence of stone.  No evidence of acute surgical abdominal pathology noted.  Patient was given IV Zofran and morphine but developed itching  and mild hypoxia after administration of morphine.  She was placed on nasal cannula with improvement in her SPO2 saturations and was weaned off of the nasal cannula.  Her itching improved with Benadryl.  Added morphine to her list of allergies and intolerances.  No signs of anaphylaxis or angioedema, epinephrine is not indicated.  Tolerated fentanyl without difficulty. 6:00PM Spoke with Dr. Alyson Ingles with Urology who recommends discharge home with treatment for pyelonephritis.  We were able to obtain a urine sample from the patient while she was in the ED which was not a clean-catch but does suggest possible UTI.   Will culture.  She was given IV Rocephin in the ED and will discharge with course of Augmentin.  On reevaluation patient resting comfortably in no apparent distress.  She reports her pain has improved significantly down to 3/10 in severity.  She is tolerating p.o. fluids without difficulty.  Serial abdominal examinations remain benign.  Stable for discharge home with follow-up with PCP or urology for reevaluation of symptoms.  Discussed strict ED return precautions. Pt verbalized understanding of and agreement with plan and is safe for discharge home at this time.  Final Clinical Impressions(s) / ED Diagnoses   Final diagnoses:  Acute pyelonephritis  Right flank pain  Non-intractable vomiting with nausea, unspecified vomiting type    ED Discharge Orders         Ordered    amoxicillin-clavulanate (AUGMENTIN) 875-125 MG tablet  Every 12 hours     02/23/19 1939    ondansetron (ZOFRAN ODT) 4 MG disintegrating tablet  Every 8 hours PRN     02/23/19 1939           Renita Papa, PA-C 02/23/19 1948    Dorie Rank, MD 02/25/19 [id number]

## 2019-02-23 NOTE — Discharge Instructions (Addendum)
Your work-up suggests that you have a kidney infection that is causing your symptoms.   1. Medications: Please take all of your antibiotics until finished!   You may develop abdominal discomfort or diarrhea from the antibiotic.  You may help offset this with probiotics which you can buy or get in yogurt. Do not eat  or take the probiotics until 2 hours after your antibiotic.   Take Zofran as needed for nausea.  Let this medicine dissolve under your tongue wait around 10-20 minutes before eating or drinking after taking this medication. 2. Treatment: rest, drink plenty of fluids, advance diet slowly.  Eat bland foods that will not upset your stomach. 3. Follow Up: Please followup with your primary doctor or urologist in 3 days for discussion of your diagnoses and further evaluation after today's visit; if you do not have a primary care doctor use the resource guide provided to find one; Please return to the ER for persistent vomiting, high fevers or worsening symptoms.  Continue going to dialysis as scheduled.

## 2019-02-23 NOTE — ED Triage Notes (Signed)
Patient reports RLQ/lateral R side pain onset after eating breakfast this morning - endorses multiple episodes of vomiting since as well. Reports feeling fine last night, no sick contacts or recent travel. Denies fevers/chills, diarrhea, or urinary symptoms. Last BM yesterday.

## 2019-02-23 NOTE — ED Notes (Signed)
Patient given PO fluids and crackers

## 2019-02-24 ENCOUNTER — Other Ambulatory Visit: Payer: Self-pay | Admitting: Internal Medicine

## 2019-02-24 DIAGNOSIS — D509 Iron deficiency anemia, unspecified: Secondary | ICD-10-CM | POA: Diagnosis not present

## 2019-02-24 DIAGNOSIS — N2581 Secondary hyperparathyroidism of renal origin: Secondary | ICD-10-CM | POA: Diagnosis not present

## 2019-02-24 DIAGNOSIS — N186 End stage renal disease: Secondary | ICD-10-CM | POA: Diagnosis not present

## 2019-02-24 DIAGNOSIS — E118 Type 2 diabetes mellitus with unspecified complications: Secondary | ICD-10-CM | POA: Diagnosis not present

## 2019-02-24 DIAGNOSIS — D631 Anemia in chronic kidney disease: Secondary | ICD-10-CM | POA: Diagnosis not present

## 2019-02-24 LAB — URINE CULTURE

## 2019-02-26 DIAGNOSIS — N186 End stage renal disease: Secondary | ICD-10-CM | POA: Diagnosis not present

## 2019-02-26 DIAGNOSIS — D631 Anemia in chronic kidney disease: Secondary | ICD-10-CM | POA: Diagnosis not present

## 2019-02-26 DIAGNOSIS — N2581 Secondary hyperparathyroidism of renal origin: Secondary | ICD-10-CM | POA: Diagnosis not present

## 2019-02-26 DIAGNOSIS — D509 Iron deficiency anemia, unspecified: Secondary | ICD-10-CM | POA: Diagnosis not present

## 2019-02-26 DIAGNOSIS — E118 Type 2 diabetes mellitus with unspecified complications: Secondary | ICD-10-CM | POA: Diagnosis not present

## 2019-02-26 DIAGNOSIS — Z992 Dependence on renal dialysis: Secondary | ICD-10-CM | POA: Diagnosis not present

## 2019-02-26 DIAGNOSIS — E1129 Type 2 diabetes mellitus with other diabetic kidney complication: Secondary | ICD-10-CM | POA: Diagnosis not present

## 2019-02-26 DIAGNOSIS — Z23 Encounter for immunization: Secondary | ICD-10-CM | POA: Diagnosis not present

## 2019-02-28 DIAGNOSIS — E118 Type 2 diabetes mellitus with unspecified complications: Secondary | ICD-10-CM | POA: Diagnosis not present

## 2019-02-28 DIAGNOSIS — D509 Iron deficiency anemia, unspecified: Secondary | ICD-10-CM | POA: Diagnosis not present

## 2019-02-28 DIAGNOSIS — N186 End stage renal disease: Secondary | ICD-10-CM | POA: Diagnosis not present

## 2019-02-28 DIAGNOSIS — Z23 Encounter for immunization: Secondary | ICD-10-CM | POA: Diagnosis not present

## 2019-02-28 DIAGNOSIS — N2581 Secondary hyperparathyroidism of renal origin: Secondary | ICD-10-CM | POA: Diagnosis not present

## 2019-02-28 DIAGNOSIS — D631 Anemia in chronic kidney disease: Secondary | ICD-10-CM | POA: Diagnosis not present

## 2019-03-03 DIAGNOSIS — D509 Iron deficiency anemia, unspecified: Secondary | ICD-10-CM | POA: Diagnosis not present

## 2019-03-03 DIAGNOSIS — E118 Type 2 diabetes mellitus with unspecified complications: Secondary | ICD-10-CM | POA: Diagnosis not present

## 2019-03-03 DIAGNOSIS — D631 Anemia in chronic kidney disease: Secondary | ICD-10-CM | POA: Diagnosis not present

## 2019-03-03 DIAGNOSIS — Z23 Encounter for immunization: Secondary | ICD-10-CM | POA: Diagnosis not present

## 2019-03-03 DIAGNOSIS — N2581 Secondary hyperparathyroidism of renal origin: Secondary | ICD-10-CM | POA: Diagnosis not present

## 2019-03-03 DIAGNOSIS — N186 End stage renal disease: Secondary | ICD-10-CM | POA: Diagnosis not present

## 2019-03-05 DIAGNOSIS — E118 Type 2 diabetes mellitus with unspecified complications: Secondary | ICD-10-CM | POA: Diagnosis not present

## 2019-03-05 DIAGNOSIS — Z23 Encounter for immunization: Secondary | ICD-10-CM | POA: Diagnosis not present

## 2019-03-05 DIAGNOSIS — N2581 Secondary hyperparathyroidism of renal origin: Secondary | ICD-10-CM | POA: Diagnosis not present

## 2019-03-05 DIAGNOSIS — D631 Anemia in chronic kidney disease: Secondary | ICD-10-CM | POA: Diagnosis not present

## 2019-03-05 DIAGNOSIS — N186 End stage renal disease: Secondary | ICD-10-CM | POA: Diagnosis not present

## 2019-03-05 DIAGNOSIS — D509 Iron deficiency anemia, unspecified: Secondary | ICD-10-CM | POA: Diagnosis not present

## 2019-03-07 DIAGNOSIS — N186 End stage renal disease: Secondary | ICD-10-CM | POA: Diagnosis not present

## 2019-03-07 DIAGNOSIS — D631 Anemia in chronic kidney disease: Secondary | ICD-10-CM | POA: Diagnosis not present

## 2019-03-07 DIAGNOSIS — Z23 Encounter for immunization: Secondary | ICD-10-CM | POA: Diagnosis not present

## 2019-03-07 DIAGNOSIS — N2581 Secondary hyperparathyroidism of renal origin: Secondary | ICD-10-CM | POA: Diagnosis not present

## 2019-03-07 DIAGNOSIS — E118 Type 2 diabetes mellitus with unspecified complications: Secondary | ICD-10-CM | POA: Diagnosis not present

## 2019-03-07 DIAGNOSIS — D509 Iron deficiency anemia, unspecified: Secondary | ICD-10-CM | POA: Diagnosis not present

## 2019-03-10 DIAGNOSIS — Z23 Encounter for immunization: Secondary | ICD-10-CM | POA: Diagnosis not present

## 2019-03-10 DIAGNOSIS — N2581 Secondary hyperparathyroidism of renal origin: Secondary | ICD-10-CM | POA: Diagnosis not present

## 2019-03-10 DIAGNOSIS — E118 Type 2 diabetes mellitus with unspecified complications: Secondary | ICD-10-CM | POA: Diagnosis not present

## 2019-03-10 DIAGNOSIS — D509 Iron deficiency anemia, unspecified: Secondary | ICD-10-CM | POA: Diagnosis not present

## 2019-03-10 DIAGNOSIS — N186 End stage renal disease: Secondary | ICD-10-CM | POA: Diagnosis not present

## 2019-03-10 DIAGNOSIS — D631 Anemia in chronic kidney disease: Secondary | ICD-10-CM | POA: Diagnosis not present

## 2019-03-12 DIAGNOSIS — E118 Type 2 diabetes mellitus with unspecified complications: Secondary | ICD-10-CM | POA: Diagnosis not present

## 2019-03-12 DIAGNOSIS — N2581 Secondary hyperparathyroidism of renal origin: Secondary | ICD-10-CM | POA: Diagnosis not present

## 2019-03-12 DIAGNOSIS — D509 Iron deficiency anemia, unspecified: Secondary | ICD-10-CM | POA: Diagnosis not present

## 2019-03-12 DIAGNOSIS — D631 Anemia in chronic kidney disease: Secondary | ICD-10-CM | POA: Diagnosis not present

## 2019-03-12 DIAGNOSIS — N186 End stage renal disease: Secondary | ICD-10-CM | POA: Diagnosis not present

## 2019-03-12 DIAGNOSIS — Z23 Encounter for immunization: Secondary | ICD-10-CM | POA: Diagnosis not present

## 2019-03-14 DIAGNOSIS — N2581 Secondary hyperparathyroidism of renal origin: Secondary | ICD-10-CM | POA: Diagnosis not present

## 2019-03-14 DIAGNOSIS — E118 Type 2 diabetes mellitus with unspecified complications: Secondary | ICD-10-CM | POA: Diagnosis not present

## 2019-03-14 DIAGNOSIS — D631 Anemia in chronic kidney disease: Secondary | ICD-10-CM | POA: Diagnosis not present

## 2019-03-14 DIAGNOSIS — Z23 Encounter for immunization: Secondary | ICD-10-CM | POA: Diagnosis not present

## 2019-03-14 DIAGNOSIS — D509 Iron deficiency anemia, unspecified: Secondary | ICD-10-CM | POA: Diagnosis not present

## 2019-03-14 DIAGNOSIS — N186 End stage renal disease: Secondary | ICD-10-CM | POA: Diagnosis not present

## 2019-03-17 DIAGNOSIS — N186 End stage renal disease: Secondary | ICD-10-CM | POA: Diagnosis not present

## 2019-03-17 DIAGNOSIS — Z23 Encounter for immunization: Secondary | ICD-10-CM | POA: Diagnosis not present

## 2019-03-17 DIAGNOSIS — D631 Anemia in chronic kidney disease: Secondary | ICD-10-CM | POA: Diagnosis not present

## 2019-03-17 DIAGNOSIS — N2581 Secondary hyperparathyroidism of renal origin: Secondary | ICD-10-CM | POA: Diagnosis not present

## 2019-03-17 DIAGNOSIS — D509 Iron deficiency anemia, unspecified: Secondary | ICD-10-CM | POA: Diagnosis not present

## 2019-03-17 DIAGNOSIS — E118 Type 2 diabetes mellitus with unspecified complications: Secondary | ICD-10-CM | POA: Diagnosis not present

## 2019-03-19 DIAGNOSIS — N2581 Secondary hyperparathyroidism of renal origin: Secondary | ICD-10-CM | POA: Diagnosis not present

## 2019-03-19 DIAGNOSIS — N186 End stage renal disease: Secondary | ICD-10-CM | POA: Diagnosis not present

## 2019-03-19 DIAGNOSIS — D509 Iron deficiency anemia, unspecified: Secondary | ICD-10-CM | POA: Diagnosis not present

## 2019-03-19 DIAGNOSIS — D631 Anemia in chronic kidney disease: Secondary | ICD-10-CM | POA: Diagnosis not present

## 2019-03-19 DIAGNOSIS — E118 Type 2 diabetes mellitus with unspecified complications: Secondary | ICD-10-CM | POA: Diagnosis not present

## 2019-03-19 DIAGNOSIS — Z23 Encounter for immunization: Secondary | ICD-10-CM | POA: Diagnosis not present

## 2019-03-21 DIAGNOSIS — E118 Type 2 diabetes mellitus with unspecified complications: Secondary | ICD-10-CM | POA: Diagnosis not present

## 2019-03-21 DIAGNOSIS — D509 Iron deficiency anemia, unspecified: Secondary | ICD-10-CM | POA: Diagnosis not present

## 2019-03-21 DIAGNOSIS — Z23 Encounter for immunization: Secondary | ICD-10-CM | POA: Diagnosis not present

## 2019-03-21 DIAGNOSIS — D631 Anemia in chronic kidney disease: Secondary | ICD-10-CM | POA: Diagnosis not present

## 2019-03-21 DIAGNOSIS — N186 End stage renal disease: Secondary | ICD-10-CM | POA: Diagnosis not present

## 2019-03-21 DIAGNOSIS — N2581 Secondary hyperparathyroidism of renal origin: Secondary | ICD-10-CM | POA: Diagnosis not present

## 2019-03-24 DIAGNOSIS — N186 End stage renal disease: Secondary | ICD-10-CM | POA: Diagnosis not present

## 2019-03-24 DIAGNOSIS — D631 Anemia in chronic kidney disease: Secondary | ICD-10-CM | POA: Diagnosis not present

## 2019-03-24 DIAGNOSIS — N2581 Secondary hyperparathyroidism of renal origin: Secondary | ICD-10-CM | POA: Diagnosis not present

## 2019-03-24 DIAGNOSIS — E118 Type 2 diabetes mellitus with unspecified complications: Secondary | ICD-10-CM | POA: Diagnosis not present

## 2019-03-24 DIAGNOSIS — Z23 Encounter for immunization: Secondary | ICD-10-CM | POA: Diagnosis not present

## 2019-03-24 DIAGNOSIS — D509 Iron deficiency anemia, unspecified: Secondary | ICD-10-CM | POA: Diagnosis not present

## 2019-03-26 DIAGNOSIS — N2581 Secondary hyperparathyroidism of renal origin: Secondary | ICD-10-CM | POA: Diagnosis not present

## 2019-03-26 DIAGNOSIS — N186 End stage renal disease: Secondary | ICD-10-CM | POA: Diagnosis not present

## 2019-03-26 DIAGNOSIS — Z23 Encounter for immunization: Secondary | ICD-10-CM | POA: Diagnosis not present

## 2019-03-26 DIAGNOSIS — D631 Anemia in chronic kidney disease: Secondary | ICD-10-CM | POA: Diagnosis not present

## 2019-03-26 DIAGNOSIS — D509 Iron deficiency anemia, unspecified: Secondary | ICD-10-CM | POA: Diagnosis not present

## 2019-03-26 DIAGNOSIS — E118 Type 2 diabetes mellitus with unspecified complications: Secondary | ICD-10-CM | POA: Diagnosis not present

## 2019-03-28 DIAGNOSIS — D509 Iron deficiency anemia, unspecified: Secondary | ICD-10-CM | POA: Diagnosis not present

## 2019-03-28 DIAGNOSIS — E1129 Type 2 diabetes mellitus with other diabetic kidney complication: Secondary | ICD-10-CM | POA: Diagnosis not present

## 2019-03-28 DIAGNOSIS — D631 Anemia in chronic kidney disease: Secondary | ICD-10-CM | POA: Diagnosis not present

## 2019-03-28 DIAGNOSIS — N2581 Secondary hyperparathyroidism of renal origin: Secondary | ICD-10-CM | POA: Diagnosis not present

## 2019-03-28 DIAGNOSIS — N186 End stage renal disease: Secondary | ICD-10-CM | POA: Diagnosis not present

## 2019-03-28 DIAGNOSIS — E118 Type 2 diabetes mellitus with unspecified complications: Secondary | ICD-10-CM | POA: Diagnosis not present

## 2019-03-28 DIAGNOSIS — Z992 Dependence on renal dialysis: Secondary | ICD-10-CM | POA: Diagnosis not present

## 2019-03-31 DIAGNOSIS — N2581 Secondary hyperparathyroidism of renal origin: Secondary | ICD-10-CM | POA: Diagnosis not present

## 2019-03-31 DIAGNOSIS — D631 Anemia in chronic kidney disease: Secondary | ICD-10-CM | POA: Diagnosis not present

## 2019-03-31 DIAGNOSIS — E118 Type 2 diabetes mellitus with unspecified complications: Secondary | ICD-10-CM | POA: Diagnosis not present

## 2019-03-31 DIAGNOSIS — N186 End stage renal disease: Secondary | ICD-10-CM | POA: Diagnosis not present

## 2019-03-31 DIAGNOSIS — D509 Iron deficiency anemia, unspecified: Secondary | ICD-10-CM | POA: Diagnosis not present

## 2019-04-02 DIAGNOSIS — N186 End stage renal disease: Secondary | ICD-10-CM | POA: Diagnosis not present

## 2019-04-02 DIAGNOSIS — D631 Anemia in chronic kidney disease: Secondary | ICD-10-CM | POA: Diagnosis not present

## 2019-04-02 DIAGNOSIS — N2581 Secondary hyperparathyroidism of renal origin: Secondary | ICD-10-CM | POA: Diagnosis not present

## 2019-04-02 DIAGNOSIS — D509 Iron deficiency anemia, unspecified: Secondary | ICD-10-CM | POA: Diagnosis not present

## 2019-04-02 DIAGNOSIS — E118 Type 2 diabetes mellitus with unspecified complications: Secondary | ICD-10-CM | POA: Diagnosis not present

## 2019-04-04 DIAGNOSIS — N2581 Secondary hyperparathyroidism of renal origin: Secondary | ICD-10-CM | POA: Diagnosis not present

## 2019-04-04 DIAGNOSIS — N186 End stage renal disease: Secondary | ICD-10-CM | POA: Diagnosis not present

## 2019-04-04 DIAGNOSIS — E118 Type 2 diabetes mellitus with unspecified complications: Secondary | ICD-10-CM | POA: Diagnosis not present

## 2019-04-04 DIAGNOSIS — D509 Iron deficiency anemia, unspecified: Secondary | ICD-10-CM | POA: Diagnosis not present

## 2019-04-04 DIAGNOSIS — D631 Anemia in chronic kidney disease: Secondary | ICD-10-CM | POA: Diagnosis not present

## 2019-04-07 DIAGNOSIS — Z6836 Body mass index (BMI) 36.0-36.9, adult: Secondary | ICD-10-CM | POA: Diagnosis not present

## 2019-04-07 DIAGNOSIS — D509 Iron deficiency anemia, unspecified: Secondary | ICD-10-CM | POA: Diagnosis not present

## 2019-04-07 DIAGNOSIS — Z992 Dependence on renal dialysis: Secondary | ICD-10-CM | POA: Diagnosis not present

## 2019-04-07 DIAGNOSIS — E669 Obesity, unspecified: Secondary | ICD-10-CM | POA: Diagnosis not present

## 2019-04-07 DIAGNOSIS — N186 End stage renal disease: Secondary | ICD-10-CM | POA: Diagnosis not present

## 2019-04-07 DIAGNOSIS — N2581 Secondary hyperparathyroidism of renal origin: Secondary | ICD-10-CM | POA: Diagnosis not present

## 2019-04-07 DIAGNOSIS — Z713 Dietary counseling and surveillance: Secondary | ICD-10-CM | POA: Diagnosis not present

## 2019-04-07 DIAGNOSIS — E119 Type 2 diabetes mellitus without complications: Secondary | ICD-10-CM | POA: Diagnosis not present

## 2019-04-07 DIAGNOSIS — E118 Type 2 diabetes mellitus with unspecified complications: Secondary | ICD-10-CM | POA: Diagnosis not present

## 2019-04-07 DIAGNOSIS — D631 Anemia in chronic kidney disease: Secondary | ICD-10-CM | POA: Diagnosis not present

## 2019-04-09 DIAGNOSIS — D631 Anemia in chronic kidney disease: Secondary | ICD-10-CM | POA: Diagnosis not present

## 2019-04-09 DIAGNOSIS — E118 Type 2 diabetes mellitus with unspecified complications: Secondary | ICD-10-CM | POA: Diagnosis not present

## 2019-04-09 DIAGNOSIS — N186 End stage renal disease: Secondary | ICD-10-CM | POA: Diagnosis not present

## 2019-04-09 DIAGNOSIS — N2581 Secondary hyperparathyroidism of renal origin: Secondary | ICD-10-CM | POA: Diagnosis not present

## 2019-04-09 DIAGNOSIS — D509 Iron deficiency anemia, unspecified: Secondary | ICD-10-CM | POA: Diagnosis not present

## 2019-04-11 DIAGNOSIS — N186 End stage renal disease: Secondary | ICD-10-CM | POA: Diagnosis not present

## 2019-04-11 DIAGNOSIS — D509 Iron deficiency anemia, unspecified: Secondary | ICD-10-CM | POA: Diagnosis not present

## 2019-04-11 DIAGNOSIS — E118 Type 2 diabetes mellitus with unspecified complications: Secondary | ICD-10-CM | POA: Diagnosis not present

## 2019-04-11 DIAGNOSIS — N2581 Secondary hyperparathyroidism of renal origin: Secondary | ICD-10-CM | POA: Diagnosis not present

## 2019-04-11 DIAGNOSIS — D631 Anemia in chronic kidney disease: Secondary | ICD-10-CM | POA: Diagnosis not present

## 2019-04-14 DIAGNOSIS — E118 Type 2 diabetes mellitus with unspecified complications: Secondary | ICD-10-CM | POA: Diagnosis not present

## 2019-04-14 DIAGNOSIS — D631 Anemia in chronic kidney disease: Secondary | ICD-10-CM | POA: Diagnosis not present

## 2019-04-14 DIAGNOSIS — N2581 Secondary hyperparathyroidism of renal origin: Secondary | ICD-10-CM | POA: Diagnosis not present

## 2019-04-14 DIAGNOSIS — N186 End stage renal disease: Secondary | ICD-10-CM | POA: Diagnosis not present

## 2019-04-14 DIAGNOSIS — D509 Iron deficiency anemia, unspecified: Secondary | ICD-10-CM | POA: Diagnosis not present

## 2019-04-16 DIAGNOSIS — D509 Iron deficiency anemia, unspecified: Secondary | ICD-10-CM | POA: Diagnosis not present

## 2019-04-16 DIAGNOSIS — N186 End stage renal disease: Secondary | ICD-10-CM | POA: Diagnosis not present

## 2019-04-16 DIAGNOSIS — N2581 Secondary hyperparathyroidism of renal origin: Secondary | ICD-10-CM | POA: Diagnosis not present

## 2019-04-16 DIAGNOSIS — D631 Anemia in chronic kidney disease: Secondary | ICD-10-CM | POA: Diagnosis not present

## 2019-04-16 DIAGNOSIS — E118 Type 2 diabetes mellitus with unspecified complications: Secondary | ICD-10-CM | POA: Diagnosis not present

## 2019-04-18 DIAGNOSIS — E118 Type 2 diabetes mellitus with unspecified complications: Secondary | ICD-10-CM | POA: Diagnosis not present

## 2019-04-18 DIAGNOSIS — N186 End stage renal disease: Secondary | ICD-10-CM | POA: Diagnosis not present

## 2019-04-18 DIAGNOSIS — D631 Anemia in chronic kidney disease: Secondary | ICD-10-CM | POA: Diagnosis not present

## 2019-04-18 DIAGNOSIS — N2581 Secondary hyperparathyroidism of renal origin: Secondary | ICD-10-CM | POA: Diagnosis not present

## 2019-04-18 DIAGNOSIS — D509 Iron deficiency anemia, unspecified: Secondary | ICD-10-CM | POA: Diagnosis not present

## 2019-04-21 ENCOUNTER — Encounter: Payer: Self-pay | Admitting: Internal Medicine

## 2019-04-21 DIAGNOSIS — D509 Iron deficiency anemia, unspecified: Secondary | ICD-10-CM | POA: Diagnosis not present

## 2019-04-21 DIAGNOSIS — E118 Type 2 diabetes mellitus with unspecified complications: Secondary | ICD-10-CM | POA: Diagnosis not present

## 2019-04-21 DIAGNOSIS — D631 Anemia in chronic kidney disease: Secondary | ICD-10-CM | POA: Diagnosis not present

## 2019-04-21 DIAGNOSIS — N186 End stage renal disease: Secondary | ICD-10-CM | POA: Diagnosis not present

## 2019-04-21 DIAGNOSIS — N2581 Secondary hyperparathyroidism of renal origin: Secondary | ICD-10-CM | POA: Diagnosis not present

## 2019-04-21 NOTE — Patient Instructions (Signed)
- Vit D  And Vit C 1,000 mg are recommended to help  protect against the Covid_19 and other Corona viruses.   - Also it's recommended to take Zinc 50 mg to help protect against the Covid_19   And best place to get  is also on [employer] and don't pay more than 6-8 cents /pill !   ===================================== Coronavirus (COVID-19) Are you at risk?  Are you at risk for the Coronavirus (COVID-19)?  To be considered HIGH RISK for Coronavirus (COVID-19), you have to meet the following criteria:  . Traveled to [place], [place], [place], [place] or [place]; or in the [place] to [place], [place], [place]  . or [place]; and have fever, cough, and shortness of breath within the last 2 weeks of travel OR . Been in close contact with a person diagnosed with COVID-19 within the last 2 weeks and have  . fever, cough,and shortness of breath .  . IF YOU DO NOT MEET THESE CRITERIA, YOU ARE CONSIDERED LOW RISK FOR COVID-19.  What to do if you are HIGH RISK for COVID-19?  . If you are having a medical emergency, call 911. . Seek medical care right away. Before you go to a doctor's office, urgent care or emergency department, .  call ahead and tell them about your recent travel, contact with someone diagnosed with COVID-19  .  and your symptoms.  . You should receive instructions from your physician's office regarding next steps of care.  . When you arrive at healthcare provider, tell the healthcare staff immediately you have returned from  . visiting [place], [place], [place], [place] or [place]; or traveled in the [place] to [place], [place],  . [place] or [place] in the last two weeks or you have been in close contact with a person diagnosed with  . COVID-19 in the last 2 weeks.   . Tell the health care staff about your symptoms: fever, cough and shortness of breath. . After you have been seen by a medical provider, you will be either: o Tested for (COVID-19) and  discharged home on quarantine except to seek medical care if  o symptoms worsen, and asked to  - Stay home and avoid contact with others until you get your results (4-5 days)  - Avoid travel on public transportation if possible (such as bus, train, or airplane) or o Sent to the Emergency Department by EMS for evaluation, COVID-19 testing  and  o possible admission depending on your condition and test results.  What to do if you are LOW RISK for COVID-19?  Reduce your risk of any infection by using the same precautions used for avoiding the common cold or flu:  . Wash your hands often with soap and warm water for at least 20 seconds.  If soap and water are not readily available,  . use an alcohol-based hand sanitizer with at least 60% alcohol.  . If coughing or sneezing, cover your mouth and nose by coughing or sneezing into the elbow areas of your shirt or coat, .  into a tissue or into your sleeve (not your hands). . Avoid shaking hands with others and consider head nods or verbal greetings only. . Avoid touching your eyes, nose, or mouth with unwashed hands.  . Avoid close contact with people who are sick. . Avoid places or events with large numbers of people in one location, like concerts or sporting events. . Carefully consider travel plans you   have or are making. . If you are planning any travel outside or inside the [place], visit the CDC's Travelers' Health webpage for the latest health notices. . If you have some symptoms but not all symptoms, continue to monitor at home and seek medical attention  . if your symptoms worsen. . If you are having a medical emergency, call 911.   ++++++++++++++++++++++++++++++++ Recommend Adult Low Dose Aspirin or  coated  Aspirin 81 mg daily  To reduce risk of Colon Cancer 40 %,  Skin Cancer 26 % ,  Melanoma 46%  and  Pancreatic cancer 60% ++++++++++++++++++++++++++++++++ Vitamin D goal  is between 70-100.  Please make sure that you are taking  your Vitamin D as directed.  It is very important as a natural anti-inflammatory  helping hair, skin, and nails, as well as reducing stroke and heart attack risk.  It helps your bones and helps with mood. It also decreases numerous cancer risks so please take it as directed.  Low Vit D is associated with a 200-300% higher risk for CANCER  and 200-300% higher risk for HEART   ATTACK  &  STROKE.   ...................................... It is also associated with higher death rate at younger ages,  autoimmune diseases like Rheumatoid arthritis, Lupus, Multiple Sclerosis.    Also many other serious conditions, like depression, Alzheimer's Dementia, infertility, muscle aches, fatigue, fibromyalgia - just to name a few. ++++++++++++++++++++ Recommend the book "The END of DIETING" by Dr Joel Fuhrman  & the book "The END of DIABETES " by Dr Joel Fuhrman At [employer] - get book & Audio CD's    Being diabetic has a  300% increased risk for heart attack, stroke, cancer, and alzheimer- type vascular dementia. It is very important that you work harder with diet by avoiding all foods that are white. Avoid white rice (brown & wild rice is OK), white potatoes (sweetpotatoes in moderation is OK), White bread or wheat bread or anything made out of white flour like bagels, donuts, rolls, buns, biscuits, cakes, pastries, cookies, pizza crust, and pasta (made from white flour & egg whites) - vegetarian pasta or spinach or wheat pasta is OK. Multigrain breads like Arnold's or Pepperidge [employer], or multigrain sandwich thins or flatbreads.  Diet, exercise and weight loss can reverse and cure diabetes in the early stages.  Diet, exercise and weight loss is very important in the control and prevention of complications of diabetes which affects every system in your body, ie. Brain - dementia/stroke, eyes - glaucoma/blindness, heart - heart attack/heart failure, kidneys - dialysis, stomach - gastric paralysis, intestines -  malabsorption, nerves - severe painful neuritis, circulation - gangrene & loss of a leg(s), and finally cancer and Alzheimers.    I recommend avoid fried & greasy foods,  sweets/candy, white rice (brown or wild rice or Quinoa is OK), white potatoes (sweet potatoes are OK) - anything made from white flour - bagels, doughnuts, rolls, buns, biscuits,white and wheat breads, pizza crust and traditional pasta made of white flour & egg white(vegetarian pasta or spinach or wheat pasta is OK).  Multi-grain bread is OK - like multi-grain flat bread or sandwich thins. Avoid alcohol in excess. Exercise is also important.    Eat all the vegetables you want - avoid meat, especially red meat and dairy - especially cheese.  Cheese is the most concentrated form of trans-fats which is the worst thing to clog up our arteries. Veggie cheese is OK which can be found in the fresh produce   section at [employer] or [employer] or [employer]  +++++++++++++++++++++ DASH Eating Plan  DASH stands for "Dietary Approaches to Stop Hypertension."   The DASH eating plan is a healthy eating plan that has been shown to reduce high blood pressure (hypertension). Additional health benefits may include reducing the risk of type 2 diabetes mellitus, heart disease, and stroke. The DASH eating plan may also help with weight loss. WHAT DO I NEED TO KNOW ABOUT THE DASH EATING PLAN? For the DASH eating plan, you will follow these general guidelines:  Choose foods with a percent daily value for sodium of less than 5% (as listed on the food label).  Use salt-free seasonings or herbs instead of table salt or sea salt.  Check with your health care provider or pharmacist before using salt substitutes.  Eat lower-sodium products, often labeled as "lower sodium" or "no salt added."  Eat fresh foods.  Eat more vegetables, fruits, and low-fat dairy products.  Choose whole grains. Look for the word "whole" as the first word in the ingredient  list.  Choose fish   Limit sweets, desserts, sugars, and sugary drinks.  Choose heart-healthy fats.  Eat veggie cheese   Eat more home-cooked food and less restaurant, buffet, and fast food.  Limit fried foods.  Cook foods using methods other than frying.  Limit canned vegetables. If you do use them, rinse them well to decrease the sodium.  When eating at a restaurant, ask that your food be prepared with less salt, or no salt if possible.                      WHAT FOODS CAN I EAT? Read Dr Joel Fuhrman's books on The End of Dieting & The End of Diabetes  Grains Whole grain or whole wheat bread. Brown rice. Whole grain or whole wheat pasta. Quinoa, bulgur, and whole grain cereals. Low-sodium cereals. Corn or whole wheat flour tortillas. Whole grain cornbread. Whole grain crackers. Low-sodium crackers.  Vegetables Fresh or frozen vegetables (raw, steamed, roasted, or grilled). Low-sodium or reduced-sodium tomato and vegetable juices. Low-sodium or reduced-sodium tomato sauce and paste. Low-sodium or reduced-sodium canned vegetables.   Fruits All fresh, canned (in natural juice), or frozen fruits.  Protein Products  All fish and seafood.  Dried beans, peas, or lentils. Unsalted nuts and seeds. Unsalted canned beans.  Dairy Low-fat dairy products, such as skim or 1% milk, 2% or reduced-fat cheeses, low-fat ricotta or cottage cheese, or plain low-fat yogurt. Low-sodium or reduced-sodium cheeses.  Fats and Oils Tub margarines without trans fats. Light or reduced-fat mayonnaise and salad dressings (reduced sodium). Avocado. Safflower, olive, or canola oils. Natural peanut or almond butter.  Other Unsalted popcorn and pretzels. The items listed above may not be a complete list of recommended foods or beverages. Contact your dietitian for more options.  +++++++++++++++  WHAT FOODS ARE NOT RECOMMENDED? Grains/ White flour or wheat flour White bread. White pasta. White rice.  Refined cornbread. Bagels and croissants. Crackers that contain trans fat.  Vegetables  Creamed or fried vegetables. Vegetables in a . Regular canned vegetables. Regular canned tomato sauce and paste. Regular tomato and vegetable juices.  Fruits Dried fruits. Canned fruit in light or heavy syrup. Fruit juice.  Meat and Other Protein Products Meat in general - RED meat & White meat.  Fatty cuts of meat. Ribs, chicken wings, all processed meats as bacon, sausage, bologna, salami, fatback, hot dogs, bratwurst and packaged luncheon meats.  Dairy Whole   or 2% milk, cream, half-and-half, and cream cheese. Whole-fat or sweetened yogurt. Full-fat cheeses or blue cheese. Non-dairy creamers and whipped toppings. Processed cheese, cheese spreads, or cheese curds.  Condiments Onion and garlic salt, seasoned salt, table salt, and sea salt. Canned and packaged gravies. Worcestershire sauce. Tartar sauce. Barbecue sauce. Teriyaki sauce. Soy sauce, including reduced sodium. Steak sauce. Fish sauce. Oyster sauce. Cocktail sauce. Horseradish. Ketchup and mustard. Meat flavorings and tenderizers. Bouillon cubes. Hot sauce. Tabasco sauce. Marinades. Taco seasonings. Relishes.  Fats and Oils Butter, stick margarine, lard, shortening and bacon fat. Coconut, palm kernel, or palm oils. Regular salad dressings.  Pickles and olives. Salted popcorn and pretzels.  The items listed above may not be a complete list of foods and beverages to avoid.   

## 2019-04-21 NOTE — Progress Notes (Signed)
History of Present Illness:      This very nice 63 y.o. [name] presents for 6 month follow up with HTN, HLD, T2_DM w/ ESRD and Vitamin D Deficiency.       Patient is treated for HTN (1999) & BP has been controlled at home. Today's BPis elevated at 140/98. Shne is between dialyses treatments . She relates that her BP's before treatments are usually about the same as today and after dialysis treatments , then BP's usu about 120's/70's. Patient underwent an elective AoVR in 0865 and had complications w/Anesthesia subsequently developed multi-systems failure and was transferred to [facility] for ECMO & Dialysis. Patient has been on Dialysis since awaiting a renal transplant. Dr Joelyn Oms is her Nephrologist. Patient has had no complaints of any cardiac type chest pain, palpitations, dyspnea / orthopnea / PND, dizziness, claudication, or dependent edema.      Hyperlipidemia is controlled with diet & meds. Patient denies myalgias or other med SE's. Last Lipids were at goal albeit elevated Trig's: Lab Results  Component Value Date   CHOL 130 01/23/2019   HDL 42 (L) 01/23/2019   LDLCALC 63 01/23/2019   TRIG 168 (H) 01/23/2019   CHOLHDL 3.1 01/23/2019       Also, the patient has Morbid Obesity (BMI 38+) and consequent history of T2_DM (2006) and after she ended up on Dialysis her insulin requirement resolved and she remains off of insulin. She reports last Insulin dose was over 2 months ago and trice weekly FBG's are usually between 100-110 mg%. She has had no symptoms of reactive hypoglycemia, diabetic polys, paresthesias or visual blurring. Last A1c was not at goal: Lab Results  Component Value Date   HGBA1C 6.2 (H) 01/23/2019      Further, the patient also  and history of Vitamin D Deficiency ("12" / 2008)  and supplements vitamin D without any suspected side-effects. Last vitamin D was still very low: Lab Results  Component Value Date   VD25OH 28 (L) 10/09/2018   Current Outpatient Medications on File  Prior to Visit  Medication Sig  . acetaminophen (TYLENOL) 500 MG tablet Take 1,000 mg by mouth every 6 (six) hours as needed (for pain.).  Marland Kitchen aspirin EC 81 MG tablet Take 81 mg by mouth daily.  Marland Kitchen azelastine (ASTELIN) 0.1 % nasal spray Use 1 to 2 sprays each nostril 2 to 3 x / day  . busPIRone (BUSPAR) 10 MG tablet TAKE 1 TABLET BY MOUTH TWICE DAILY (Patient taking differently: Take 10 mg by mouth 2 (two) times daily. )  . cinacalcet (SENSIPAR) 60 MG tablet Take 1 tablet (60 mg total) by mouth daily.  . insulin aspart protamine - aspart (NOVOLOG MIX 70/30 FLEXPEN) (70-30) 100 UNIT/ML FlexPen Inject 0.1 mLs (10 Units total) into the skin 2 (two) times daily. (Patient taking differently: Inject 10 Units into the skin 2 (two) times daily as needed (if blood sugar is above 180). )  . latanoprost (XALATAN) 0.005 % ophthalmic solution Place 1 drop into both eyes daily.  Marland Kitchen losartan (COZAAR) 50 MG tablet Take 1 tablet Daily for BP  . ondansetron (ZOFRAN ODT) 4 MG disintegrating tablet Take 1 tablet (4 mg total) by mouth every 8 (eight) hours as needed for nausea or vomiting.  Marland Kitchen RENVELA 800 MG tablet Take 1,600-4,000 mg by mouth 3 (three) times daily. 4000 mg (5 caps) with meals and 1600 mg (2 caps) with snacks  . rosuvastatin (CRESTOR) 20 MG tablet TAKE 1 TABLET BY MOUTH ONCE  DAILY (Patient taking differently: Take 20 mg by mouth daily. )  . sertraline (ZOLOFT) 25 MG tablet TAKE 1 TABLET BY MOUTH ONCE DAILY (Patient taking differently: Take 25 mg by mouth daily. )  . carvedilol (COREG) 6.25 MG tablet Take 1 tablet (6.25 mg total) by mouth 2 (two) times daily.   No current facility-administered medications on file prior to visit.    Allergies  Allergen Reactions  . Minoxidil Other (See Comments)    Pericardial effusion  . Lipitor [Atorvastatin] Other (See Comments)    MYALGIAS > "pain in legs" Tolerates rosuvastatin  . Morphine And Related Itching  . Ace Inhibitors Other (See Comments)    REACTION:  "not sure...think it made me drowsy all the time"   PMHx:   Past Medical History:  Diagnosis Date  . Anemia   . Chronic diastolic CHF (congestive heart failure) ([place])   . Chronic diastolic heart failure ([place])   . CKD (chronic kidney disease) stage 4, GFR 15-29 ml/min (HCC)    dialysis M/W/F  . Constipation   . CVA (cerebral infarction) 1997   no residual deficit  . Diverticulitis   . DM (diabetes mellitus) ([place])    type 2  . Heart murmur   . History of cardiovascular stress test    a. Myoview Oct 2012 showed EF 49%, no ischemia, LVE  . HLD (hyperlipidemia)    takes Crestor daily  . Hypertension   . Pericardial effusion    chronic; felt to be poss related to minoxidil >> DC'd  . Pulmonary hypertension ([place]) 06/18/2015  . Respiratory failure, acute hypoxic, post-operative 07/08/2015   Requiring ECMO support  . S/P cardiac catheterization    a. R/L HC 06/18/15:  mLAD 30%; severe pulmo HTN with PA sat 43%, CI 1.86, prominent V waves indicative of MR; resting hypoxemia O2 sat 86% on RA  . S/P minimally invasive mitral valve repair 07/08/2015   Complex valvuloplasty including artificial Gore-tex neochord placement x10 and 26 mm Sorin Memo 3D Rechord ring annuloplasty via right mini thoracotomy approach  . Severe mitral regurgitation   . Shortness of breath dyspnea   . Stroke ([place]) 1997   no residual effect  . Vitamin D deficiency   . Wears glasses    Immunization History  Administered Date(s) Administered  . Influenza Split 08/20/2012, 08/15/2017  . PPD Test 03/25/2014, 03/29/2015  . Pneumococcal Polysaccharide-23 09/11/2017  . Pneumococcal-Unspecified 07/28/2008  . Td 01/23/2019  . Tdap 12/28/2008   Past Surgical History:  Procedure Laterality Date  . A/V FISTULAGRAM Right 08/16/2017   Procedure: A/V Fistulagram;  Surgeon: Conrad Westport, MD;  Location: [place] [facility];  Service: Cardiovascular;  Laterality: Right;  . AV FISTULA PLACEMENT Left 10/22/2015   Procedure:  RADIOCEPHALIC ARTERIOVENOUS (AV) FISTULA CREATION;  Surgeon: Serafina Mitchell, MD;  Location: [place];  Service: Vascular;  Laterality: Left;  . AV FISTULA PLACEMENT Right 05/22/2017   Procedure: ARTERIOVENOUS (AV) FISTULA CREATION-RIGHT ARM;  Surgeon: Waynetta Sandy, MD;  Location: [place];  Service: Vascular;  Laterality: Right;  . CANNULATION FOR CARDIOPULMONARY BYPASS N/A 07/08/2015   Procedure: CANNULATION FOR ECMO;  Surgeon: Rexene Alberts, MD;  Location: [place];  Service: Open Heart Surgery;  Laterality: N/A;  . CARDIAC CATHETERIZATION    . CARDIAC CATHETERIZATION N/A 06/18/2015   Procedure: Right/Left Heart Cath and Coronary Angiography;  Surgeon: Jettie Booze, MD;  Location: [place] [facility];  Service: Cardiovascular;  Laterality: N/A;  . CESAREAN SECTION  x 2  . COLONOSCOPY    . EXCHANGE OF A DIALYSIS CATHETER N/A 06/28/2017   Procedure: EXCHANGE OF A DIALYSIS CATHETER;  Surgeon: Waynetta Sandy, MD;  Location: [place];  Service: Vascular;  Laterality: N/A;  . FISTULA SUPERFICIALIZATION Left 12/28/2015   Procedure: SUPERFICIALIZATION LEFT RADIOCEPHALIC FISTULA;  Surgeon: Serafina Mitchell, MD;  Location: [place];  Service: Vascular;  Laterality: Left;  . FISTULA SUPERFICIALIZATION Right 08/21/2017   Procedure: FISTULA SUPERFICIALIZATION RIGHT ARM;  Surgeon: Waynetta Sandy, MD;  Location: [place];  Service: Vascular;  Laterality: Right;  . INSERTION OF DIALYSIS CATHETER Right 04/28/2017   Procedure: INSERTION OF DIALYSIS CATHETER-RIGHT INTERNAL JUGULAR PLACEMENT;  Surgeon: Elam Dutch, MD;  Location: [place];  Service: Vascular;  Laterality: Right;  . LIGATION OF ARTERIOVENOUS  FISTULA Left 04/28/2017   Procedure: LIGATION OF ARTERIOVENOUS  FISTULA;  Surgeon: Elam Dutch, MD;  Location: [place];  Service: Vascular;  Laterality: Left;  . MITRAL VALVE REPAIR Right 07/08/2015   Procedure: MINIMALLY INVASIVE MITRAL VALVE REPAIR with a 26 Sorin Memo 3D Rechord;   Surgeon: Rexene Alberts, MD;  Location: [place];  Service: Open Heart Surgery;  Laterality: Right;  . MULTIPLE EXTRACTIONS WITH ALVEOLOPLASTY N/A 06/30/2015   Procedure: MULTIPLE EXTRACTIONS OF TOOTH #'S 4  AND 30 WITH ALVEOLOPLASTY AND GROSS DEBRIDEMENT  OF REMAINING TEETH;  Surgeon: Lenn Cal, DDS;  Location: [place];  Service: Oral Surgery;  Laterality: N/A;  . PERIPHERAL VASCULAR CATHETERIZATION Left 03/28/2016   Procedure: A/V Fistulagram;  Surgeon: Serafina Mitchell, MD;  Location: [place] [facility];  Service: Cardiovascular;  Laterality: Left;  lower arm  . TEE WITHOUT CARDIOVERSION N/A 06/08/2015   Procedure: TRANSESOPHAGEAL ECHOCARDIOGRAM (TEE);  Surgeon: Lelon Perla, MD;  Location: [facility];  Service: Cardiovascular;  Laterality: N/A;  . TEE WITHOUT CARDIOVERSION N/A 07/08/2015   Procedure: TRANSESOPHAGEAL ECHOCARDIOGRAM (TEE);  Surgeon: Rexene Alberts, MD;  Location: [place];  Service: Open Heart Surgery;  Laterality: N/A;   FHx:    Reviewed / unchanged  SHx:    Reviewed / unchanged   Systems Review:  Constitutional: Denies fever, chills, wt changes, headaches, insomnia, fatigue, night sweats, change in appetite. Eyes: Denies redness, blurred vision, diplopia, discharge, itchy, watery eyes.  ENT: Denies discharge, congestion, post nasal drip, epistaxis, sore throat, earache, hearing loss, dental pain, tinnitus, vertigo, sinus pain, snoring.  CV: Denies chest pain, palpitations, irregular heartbeat, syncope, dyspnea, diaphoresis, orthopnea, PND, claudication or edema. Respiratory: denies cough, dyspnea, DOE, pleurisy, hoarseness, laryngitis, wheezing.  Gastrointestinal: Denies dysphagia, odynophagia, heartburn, reflux, water brash, abdominal pain or cramps, nausea, vomiting, bloating, diarrhea, constipation, hematemesis, melena, hematochezia  or hemorrhoids. Genitourinary: Denies dysuria, frequency, urgency, nocturia, hesitancy, discharge, hematuria or flank pain.  Musculoskeletal: Denies arthralgias, myalgias, stiffness, jt. swelling, pain, limping or strain/sprain.  Skin: Denies pruritus, rash, hives, warts, acne, eczema or change in skin lesion(s). Neuro: No weakness, tremor, incoordination, spasms, paresthesia or pain. Psychiatric: Denies confusion, memory loss or sensory loss. Endo: Denies change in weight, skin or hair change.  Heme/Lymph: No excessive bleeding, bruising or enlarged lymph nodes.  Physical Exam  BP (!) 140/98   Pulse 84   Temp (!) 97 F (36.1 C)   Resp 16   Ht 4' 11.5" (1.511 m)   Wt 184 lb 9.6 oz (83.7 kg)   BMI 36.66 kg/m   Appears  well nourished, well groomed  and in no distress.  Eyes: PERRLA, EOMs, conjunctiva no swelling or  erythema. Sinuses: No frontal/maxillary tenderness ENT/Mouth: EAC's clear, TM's nl w/o erythema, bulging. Nares clear w/o erythema, swelling, exudates. Oropharynx clear without erythema or exudates. Oral hygiene is good. Tongue normal, non obstructing. Hearing intact.  Neck: Supple. Thyroid not palpable. Car 2+/2+ without bruits, nodes or JVD. Chest: Respirations nl with BS clear & equal w/o rales, rhonchi, wheezing or stridor.  Cor: Heart sounds normal w/ regular rate and rhythm without sig. murmurs, gallops, clicks or rubs. Peripheral pulses normal and equal  without edema.  Abdomen: Soft & bowel sounds normal. Non-tender w/o guarding, rebound, hernias, masses or organomegaly.  Lymphatics: Unremarkable.  Musculoskeletal: Full ROM all peripheral extremities, joint stability, 5/5 strength and normal gait.  Skin: Warm, dry without exposed rashes, lesions or ecchymosis apparent.  Neuro: Cranial nerves intact, reflexes equal bilaterally. Sensory-motor testing grossly intact. Tendon reflexes grossly intact.  Pysch: Alert & oriented x 3.  Insight and judgement nl & appropriate. No ideations.  Assessment and Plan:  1. Essential hypertension  - Continue medication, monitor blood pressure at home.   - Continue DASH diet.  Reminder to go to the ER if any CP,  SOB, nausea, dizziness, severe HA, changes vision/speech.  - CBC with Differential/Platelet - COMPLETE METABOLIC PANEL WITH GFR - Magnesium - TSH  2. Hyperlipidemia, mixed  - Continue diet/meds, exercise,& lifestyle modifications.  - Continue monitor periodic cholesterol/liver & renal functions   - Lipid panel - TSH  3. Type 2 diabetes mellitus with chronic kidney disease on chronic dialysis, with long-term current use of insulin (HCC)  - Continue diet, exercise  - Lifestyle modifications.  - Monitor appropriate labs.  - Hemoglobin A1c - Insulin, random  4. Vitamin D deficiency  - Continue supplementation.  - VITAMIN D 25 Hydroxyl  5. Chronic diastolic heart failure ([place])   6. ESRD on dialysis ([place])  - COMPLETE METABOLIC PANEL WITH GFR  7. Medication management  - CBC with Differential/Platelet - COMPLETE METABOLIC PANEL WITH GFR - Magnesium - Lipid panel - TSH - Hemoglobin A1c - Insulin, random - VITAMIN D 25 Hydroxyl       Discussed  regular exercise, BP monitoring, weight control to achieve/maintain BMI less than 25 and discussed med and SE's. Recommended labs to assess and monitor clinical status with further disposition pending results of labs. I discussed the assessment and treatment plan with the patient. The patient was provided an opportunity to ask questions and all were answered. The patient agreed with the plan and demonstrated an understanding of the instructions. I provided over 26 minutes of exam, counseling, chart review and  complex critical decision making was performed   Kirtland Bouchard, MD

## 2019-04-22 ENCOUNTER — Other Ambulatory Visit: Payer: Self-pay

## 2019-04-22 ENCOUNTER — Ambulatory Visit (INDEPENDENT_AMBULATORY_CARE_PROVIDER_SITE_OTHER): Payer: Medicare Other | Admitting: Internal Medicine

## 2019-04-22 VITALS — BP 140/98 | HR 84 | Temp 97.0°F | Resp 16 | Ht 59.5 in | Wt 184.6 lb

## 2019-04-22 DIAGNOSIS — Z794 Long term (current) use of insulin: Secondary | ICD-10-CM | POA: Diagnosis not present

## 2019-04-22 DIAGNOSIS — E559 Vitamin D deficiency, unspecified: Secondary | ICD-10-CM | POA: Diagnosis not present

## 2019-04-22 DIAGNOSIS — E1122 Type 2 diabetes mellitus with diabetic chronic kidney disease: Secondary | ICD-10-CM

## 2019-04-22 DIAGNOSIS — E782 Mixed hyperlipidemia: Secondary | ICD-10-CM | POA: Diagnosis not present

## 2019-04-22 DIAGNOSIS — I5032 Chronic diastolic (congestive) heart failure: Secondary | ICD-10-CM

## 2019-04-22 DIAGNOSIS — N186 End stage renal disease: Secondary | ICD-10-CM | POA: Diagnosis not present

## 2019-04-22 DIAGNOSIS — Z79899 Other long term (current) drug therapy: Secondary | ICD-10-CM | POA: Diagnosis not present

## 2019-04-22 DIAGNOSIS — Z992 Dependence on renal dialysis: Secondary | ICD-10-CM

## 2019-04-22 DIAGNOSIS — I480 Paroxysmal atrial fibrillation: Secondary | ICD-10-CM | POA: Diagnosis not present

## 2019-04-22 DIAGNOSIS — I10 Essential (primary) hypertension: Secondary | ICD-10-CM | POA: Diagnosis not present

## 2019-04-23 DIAGNOSIS — N2581 Secondary hyperparathyroidism of renal origin: Secondary | ICD-10-CM | POA: Diagnosis not present

## 2019-04-23 DIAGNOSIS — N186 End stage renal disease: Secondary | ICD-10-CM | POA: Diagnosis not present

## 2019-04-23 DIAGNOSIS — E118 Type 2 diabetes mellitus with unspecified complications: Secondary | ICD-10-CM | POA: Diagnosis not present

## 2019-04-23 DIAGNOSIS — D509 Iron deficiency anemia, unspecified: Secondary | ICD-10-CM | POA: Diagnosis not present

## 2019-04-23 DIAGNOSIS — D631 Anemia in chronic kidney disease: Secondary | ICD-10-CM | POA: Diagnosis not present

## 2019-04-23 LAB — HEMOGLOBIN A1C
Hgb A1c MFr Bld: 6.3 % of total Hgb — ABNORMAL HIGH (ref <5.7)
Mean Plasma Glucose: 134 (calc)
eAG (mmol/L): 7.4 (calc)

## 2019-04-23 LAB — CBC WITH DIFFERENTIAL/PLATELET
Absolute Monocytes: 640 cells/uL (ref 200–950)
Basophils Absolute: 62 cells/uL (ref 0–200)
Basophils Relative: 1.2 %
Eosinophils Absolute: 291 cells/uL (ref 15–500)
Eosinophils Relative: 5.6 %
HCT: 37.1 % (ref 35.0–45.0)
Hemoglobin: 11.3 g/dL — ABNORMAL LOW (ref 11.7–15.5)
Lymphs Abs: 1175 cells/uL (ref 850–3900)
MCH: 25.5 pg — ABNORMAL LOW (ref 27.0–33.0)
MCHC: 30.5 g/dL — ABNORMAL LOW (ref 32.0–36.0)
MCV: 83.7 fL (ref 80.0–100.0)
MPV: 11.1 fL (ref 7.5–12.5)
Monocytes Relative: 12.3 %
Neutro Abs: 3032 cells/uL (ref 1500–7800)
Neutrophils Relative %: 58.3 %
Platelets: 250 10*3/uL (ref 140–400)
RBC: 4.43 10*6/uL (ref 3.80–5.10)
RDW: 17.1 % — ABNORMAL HIGH (ref 11.0–15.0)
Total Lymphocyte: 22.6 %
WBC: 5.2 10*3/uL (ref 3.8–10.8)

## 2019-04-23 LAB — COMPLETE METABOLIC PANEL WITH GFR
AG Ratio: 1.4 (calc) (ref 1.0–2.5)
ALT: 6 U/L (ref 6–29)
AST: 11 U/L (ref 10–35)
Albumin: 4.6 g/dL (ref 3.6–5.1)
Alkaline phosphatase (APISO): 48 U/L (ref 37–153)
BUN/Creatinine Ratio: 6 (calc) (ref 6–22)
BUN: 41 mg/dL — ABNORMAL HIGH (ref 7–25)
CO2: 35 mmol/L — ABNORMAL HIGH (ref 20–32)
Calcium: 10.3 mg/dL (ref 8.6–10.4)
Chloride: 92 mmol/L — ABNORMAL LOW (ref 98–110)
Creat: 7.27 mg/dL — ABNORMAL HIGH (ref 0.50–0.99)
GFR, Est African American: 6 mL/min/{1.73_m2} — ABNORMAL LOW (ref >=60)
GFR, Est Non African American: 5 mL/min/{1.73_m2} — ABNORMAL LOW (ref >=60)
Globulin: 3.4 g/dL (calc) (ref 1.9–3.7)
Glucose, Bld: 108 mg/dL — ABNORMAL HIGH (ref 65–99)
Potassium: 4 mmol/L (ref 3.5–5.3)
Sodium: 139 mmol/L (ref 135–146)
Total Bilirubin: 0.6 mg/dL (ref 0.2–1.2)
Total Protein: 8 g/dL (ref 6.1–8.1)

## 2019-04-23 LAB — LIPID PANEL
Cholesterol: 173 mg/dL (ref <200)
HDL: 43 mg/dL — ABNORMAL LOW (ref >=50)
LDL Cholesterol (Calc): 98 mg/dL (calc)
Non-HDL Cholesterol (Calc): 130 mg/dL (calc) — ABNORMAL HIGH (ref <130)
Total CHOL/HDL Ratio: 4 (calc) (ref <5.0)
Triglycerides: 208 mg/dL — ABNORMAL HIGH (ref <150)

## 2019-04-23 LAB — TSH: TSH: 2.48 mIU/L (ref 0.40–4.50)

## 2019-04-23 LAB — VITAMIN D 25 HYDROXY (VIT D DEFICIENCY, FRACTURES): Vit D, 25-Hydroxy: 32 ng/mL (ref 30–100)

## 2019-04-23 LAB — INSULIN, RANDOM: Insulin: 16.8 u[IU]/mL

## 2019-04-23 LAB — MAGNESIUM: Magnesium: 2.3 mg/dL (ref 1.5–2.5)

## 2019-04-25 DIAGNOSIS — N186 End stage renal disease: Secondary | ICD-10-CM | POA: Diagnosis not present

## 2019-04-25 DIAGNOSIS — E118 Type 2 diabetes mellitus with unspecified complications: Secondary | ICD-10-CM | POA: Diagnosis not present

## 2019-04-25 DIAGNOSIS — D631 Anemia in chronic kidney disease: Secondary | ICD-10-CM | POA: Diagnosis not present

## 2019-04-25 DIAGNOSIS — N2581 Secondary hyperparathyroidism of renal origin: Secondary | ICD-10-CM | POA: Diagnosis not present

## 2019-04-25 DIAGNOSIS — D509 Iron deficiency anemia, unspecified: Secondary | ICD-10-CM | POA: Diagnosis not present

## 2019-04-28 DIAGNOSIS — F419 Anxiety disorder, unspecified: Secondary | ICD-10-CM | POA: Diagnosis not present

## 2019-04-28 DIAGNOSIS — Z9889 Other specified postprocedural states: Secondary | ICD-10-CM | POA: Diagnosis not present

## 2019-04-28 DIAGNOSIS — N2581 Secondary hyperparathyroidism of renal origin: Secondary | ICD-10-CM | POA: Diagnosis not present

## 2019-04-28 DIAGNOSIS — Z95818 Presence of other cardiac implants and grafts: Secondary | ICD-10-CM | POA: Diagnosis not present

## 2019-04-28 DIAGNOSIS — I10 Essential (primary) hypertension: Secondary | ICD-10-CM | POA: Diagnosis not present

## 2019-04-28 DIAGNOSIS — D631 Anemia in chronic kidney disease: Secondary | ICD-10-CM | POA: Diagnosis not present

## 2019-04-28 DIAGNOSIS — E1122 Type 2 diabetes mellitus with diabetic chronic kidney disease: Secondary | ICD-10-CM | POA: Diagnosis not present

## 2019-04-28 DIAGNOSIS — D509 Iron deficiency anemia, unspecified: Secondary | ICD-10-CM | POA: Diagnosis not present

## 2019-04-28 DIAGNOSIS — I272 Pulmonary hypertension, unspecified: Secondary | ICD-10-CM | POA: Diagnosis not present

## 2019-04-28 DIAGNOSIS — Z8673 Personal history of transient ischemic attack (TIA), and cerebral infarction without residual deficits: Secondary | ICD-10-CM | POA: Diagnosis not present

## 2019-04-28 DIAGNOSIS — E1129 Type 2 diabetes mellitus with other diabetic kidney complication: Secondary | ICD-10-CM | POA: Diagnosis not present

## 2019-04-28 DIAGNOSIS — Z992 Dependence on renal dialysis: Secondary | ICD-10-CM | POA: Diagnosis not present

## 2019-04-28 DIAGNOSIS — E118 Type 2 diabetes mellitus with unspecified complications: Secondary | ICD-10-CM | POA: Diagnosis not present

## 2019-04-28 DIAGNOSIS — N186 End stage renal disease: Secondary | ICD-10-CM | POA: Diagnosis not present

## 2019-04-28 DIAGNOSIS — I5032 Chronic diastolic (congestive) heart failure: Secondary | ICD-10-CM | POA: Diagnosis not present

## 2019-04-28 DIAGNOSIS — I132 Hypertensive heart and chronic kidney disease with heart failure and with stage 5 chronic kidney disease, or end stage renal disease: Secondary | ICD-10-CM | POA: Diagnosis not present

## 2019-04-28 DIAGNOSIS — I2722 Pulmonary hypertension due to left heart disease: Secondary | ICD-10-CM | POA: Diagnosis not present

## 2019-04-28 DIAGNOSIS — N185 Chronic kidney disease, stage 5: Secondary | ICD-10-CM | POA: Diagnosis not present

## 2019-04-29 DIAGNOSIS — I272 Pulmonary hypertension, unspecified: Secondary | ICD-10-CM | POA: Diagnosis not present

## 2019-04-29 DIAGNOSIS — I132 Hypertensive heart and chronic kidney disease with heart failure and with stage 5 chronic kidney disease, or end stage renal disease: Secondary | ICD-10-CM | POA: Diagnosis not present

## 2019-04-29 DIAGNOSIS — Z9889 Other specified postprocedural states: Secondary | ICD-10-CM | POA: Diagnosis not present

## 2019-04-29 DIAGNOSIS — Z8673 Personal history of transient ischemic attack (TIA), and cerebral infarction without residual deficits: Secondary | ICD-10-CM | POA: Diagnosis not present

## 2019-04-29 DIAGNOSIS — I5032 Chronic diastolic (congestive) heart failure: Secondary | ICD-10-CM | POA: Diagnosis not present

## 2019-04-29 DIAGNOSIS — E669 Obesity, unspecified: Secondary | ICD-10-CM | POA: Diagnosis not present

## 2019-04-29 DIAGNOSIS — I2722 Pulmonary hypertension due to left heart disease: Secondary | ICD-10-CM | POA: Diagnosis not present

## 2019-04-29 DIAGNOSIS — N185 Chronic kidney disease, stage 5: Secondary | ICD-10-CM | POA: Diagnosis not present

## 2019-04-29 DIAGNOSIS — E1122 Type 2 diabetes mellitus with diabetic chronic kidney disease: Secondary | ICD-10-CM | POA: Diagnosis not present

## 2019-04-29 DIAGNOSIS — I10 Essential (primary) hypertension: Secondary | ICD-10-CM | POA: Diagnosis not present

## 2019-04-29 DIAGNOSIS — Z794 Long term (current) use of insulin: Secondary | ICD-10-CM | POA: Diagnosis not present

## 2019-04-29 DIAGNOSIS — Z992 Dependence on renal dialysis: Secondary | ICD-10-CM | POA: Diagnosis not present

## 2019-04-29 DIAGNOSIS — E559 Vitamin D deficiency, unspecified: Secondary | ICD-10-CM | POA: Diagnosis not present

## 2019-04-29 DIAGNOSIS — N186 End stage renal disease: Secondary | ICD-10-CM | POA: Diagnosis not present

## 2019-04-29 DIAGNOSIS — Z95818 Presence of other cardiac implants and grafts: Secondary | ICD-10-CM | POA: Diagnosis not present

## 2019-04-29 DIAGNOSIS — F419 Anxiety disorder, unspecified: Secondary | ICD-10-CM | POA: Diagnosis not present

## 2019-04-30 DIAGNOSIS — N186 End stage renal disease: Secondary | ICD-10-CM | POA: Diagnosis not present

## 2019-04-30 DIAGNOSIS — D509 Iron deficiency anemia, unspecified: Secondary | ICD-10-CM | POA: Diagnosis not present

## 2019-04-30 DIAGNOSIS — N2581 Secondary hyperparathyroidism of renal origin: Secondary | ICD-10-CM | POA: Diagnosis not present

## 2019-04-30 DIAGNOSIS — E118 Type 2 diabetes mellitus with unspecified complications: Secondary | ICD-10-CM | POA: Diagnosis not present

## 2019-04-30 DIAGNOSIS — D631 Anemia in chronic kidney disease: Secondary | ICD-10-CM | POA: Diagnosis not present

## 2019-05-02 DIAGNOSIS — E118 Type 2 diabetes mellitus with unspecified complications: Secondary | ICD-10-CM | POA: Diagnosis not present

## 2019-05-02 DIAGNOSIS — N186 End stage renal disease: Secondary | ICD-10-CM | POA: Diagnosis not present

## 2019-05-02 DIAGNOSIS — N2581 Secondary hyperparathyroidism of renal origin: Secondary | ICD-10-CM | POA: Diagnosis not present

## 2019-05-02 DIAGNOSIS — D631 Anemia in chronic kidney disease: Secondary | ICD-10-CM | POA: Diagnosis not present

## 2019-05-02 DIAGNOSIS — D509 Iron deficiency anemia, unspecified: Secondary | ICD-10-CM | POA: Diagnosis not present

## 2019-05-05 DIAGNOSIS — D631 Anemia in chronic kidney disease: Secondary | ICD-10-CM | POA: Diagnosis not present

## 2019-05-05 DIAGNOSIS — D509 Iron deficiency anemia, unspecified: Secondary | ICD-10-CM | POA: Diagnosis not present

## 2019-05-05 DIAGNOSIS — N186 End stage renal disease: Secondary | ICD-10-CM | POA: Diagnosis not present

## 2019-05-05 DIAGNOSIS — N2581 Secondary hyperparathyroidism of renal origin: Secondary | ICD-10-CM | POA: Diagnosis not present

## 2019-05-05 DIAGNOSIS — E118 Type 2 diabetes mellitus with unspecified complications: Secondary | ICD-10-CM | POA: Diagnosis not present

## 2019-05-07 DIAGNOSIS — E118 Type 2 diabetes mellitus with unspecified complications: Secondary | ICD-10-CM | POA: Diagnosis not present

## 2019-05-07 DIAGNOSIS — N2581 Secondary hyperparathyroidism of renal origin: Secondary | ICD-10-CM | POA: Diagnosis not present

## 2019-05-07 DIAGNOSIS — D631 Anemia in chronic kidney disease: Secondary | ICD-10-CM | POA: Diagnosis not present

## 2019-05-07 DIAGNOSIS — D509 Iron deficiency anemia, unspecified: Secondary | ICD-10-CM | POA: Diagnosis not present

## 2019-05-07 DIAGNOSIS — N186 End stage renal disease: Secondary | ICD-10-CM | POA: Diagnosis not present

## 2019-05-09 DIAGNOSIS — E118 Type 2 diabetes mellitus with unspecified complications: Secondary | ICD-10-CM | POA: Diagnosis not present

## 2019-05-09 DIAGNOSIS — N2581 Secondary hyperparathyroidism of renal origin: Secondary | ICD-10-CM | POA: Diagnosis not present

## 2019-05-09 DIAGNOSIS — D509 Iron deficiency anemia, unspecified: Secondary | ICD-10-CM | POA: Diagnosis not present

## 2019-05-09 DIAGNOSIS — D631 Anemia in chronic kidney disease: Secondary | ICD-10-CM | POA: Diagnosis not present

## 2019-05-09 DIAGNOSIS — N186 End stage renal disease: Secondary | ICD-10-CM | POA: Diagnosis not present

## 2019-05-12 DIAGNOSIS — D509 Iron deficiency anemia, unspecified: Secondary | ICD-10-CM | POA: Diagnosis not present

## 2019-05-12 DIAGNOSIS — D631 Anemia in chronic kidney disease: Secondary | ICD-10-CM | POA: Diagnosis not present

## 2019-05-12 DIAGNOSIS — N186 End stage renal disease: Secondary | ICD-10-CM | POA: Diagnosis not present

## 2019-05-12 DIAGNOSIS — N2581 Secondary hyperparathyroidism of renal origin: Secondary | ICD-10-CM | POA: Diagnosis not present

## 2019-05-12 DIAGNOSIS — E118 Type 2 diabetes mellitus with unspecified complications: Secondary | ICD-10-CM | POA: Diagnosis not present

## 2019-05-14 DIAGNOSIS — E118 Type 2 diabetes mellitus with unspecified complications: Secondary | ICD-10-CM | POA: Diagnosis not present

## 2019-05-14 DIAGNOSIS — N2581 Secondary hyperparathyroidism of renal origin: Secondary | ICD-10-CM | POA: Diagnosis not present

## 2019-05-14 DIAGNOSIS — N186 End stage renal disease: Secondary | ICD-10-CM | POA: Diagnosis not present

## 2019-05-14 DIAGNOSIS — D509 Iron deficiency anemia, unspecified: Secondary | ICD-10-CM | POA: Diagnosis not present

## 2019-05-14 DIAGNOSIS — D631 Anemia in chronic kidney disease: Secondary | ICD-10-CM | POA: Diagnosis not present

## 2019-05-16 DIAGNOSIS — N186 End stage renal disease: Secondary | ICD-10-CM | POA: Diagnosis not present

## 2019-05-16 DIAGNOSIS — D631 Anemia in chronic kidney disease: Secondary | ICD-10-CM | POA: Diagnosis not present

## 2019-05-16 DIAGNOSIS — N2581 Secondary hyperparathyroidism of renal origin: Secondary | ICD-10-CM | POA: Diagnosis not present

## 2019-05-16 DIAGNOSIS — D509 Iron deficiency anemia, unspecified: Secondary | ICD-10-CM | POA: Diagnosis not present

## 2019-05-16 DIAGNOSIS — E118 Type 2 diabetes mellitus with unspecified complications: Secondary | ICD-10-CM | POA: Diagnosis not present

## 2019-05-19 DIAGNOSIS — D509 Iron deficiency anemia, unspecified: Secondary | ICD-10-CM | POA: Diagnosis not present

## 2019-05-19 DIAGNOSIS — N2581 Secondary hyperparathyroidism of renal origin: Secondary | ICD-10-CM | POA: Diagnosis not present

## 2019-05-19 DIAGNOSIS — E118 Type 2 diabetes mellitus with unspecified complications: Secondary | ICD-10-CM | POA: Diagnosis not present

## 2019-05-19 DIAGNOSIS — N186 End stage renal disease: Secondary | ICD-10-CM | POA: Diagnosis not present

## 2019-05-19 DIAGNOSIS — D631 Anemia in chronic kidney disease: Secondary | ICD-10-CM | POA: Diagnosis not present

## 2019-05-21 DIAGNOSIS — D509 Iron deficiency anemia, unspecified: Secondary | ICD-10-CM | POA: Diagnosis not present

## 2019-05-21 DIAGNOSIS — E118 Type 2 diabetes mellitus with unspecified complications: Secondary | ICD-10-CM | POA: Diagnosis not present

## 2019-05-21 DIAGNOSIS — N2581 Secondary hyperparathyroidism of renal origin: Secondary | ICD-10-CM | POA: Diagnosis not present

## 2019-05-21 DIAGNOSIS — D631 Anemia in chronic kidney disease: Secondary | ICD-10-CM | POA: Diagnosis not present

## 2019-05-21 DIAGNOSIS — N186 End stage renal disease: Secondary | ICD-10-CM | POA: Diagnosis not present

## 2019-05-23 DIAGNOSIS — N186 End stage renal disease: Secondary | ICD-10-CM | POA: Diagnosis not present

## 2019-05-23 DIAGNOSIS — D509 Iron deficiency anemia, unspecified: Secondary | ICD-10-CM | POA: Diagnosis not present

## 2019-05-23 DIAGNOSIS — E118 Type 2 diabetes mellitus with unspecified complications: Secondary | ICD-10-CM | POA: Diagnosis not present

## 2019-05-23 DIAGNOSIS — D631 Anemia in chronic kidney disease: Secondary | ICD-10-CM | POA: Diagnosis not present

## 2019-05-23 DIAGNOSIS — N2581 Secondary hyperparathyroidism of renal origin: Secondary | ICD-10-CM | POA: Diagnosis not present

## 2019-05-26 DIAGNOSIS — N2581 Secondary hyperparathyroidism of renal origin: Secondary | ICD-10-CM | POA: Diagnosis not present

## 2019-05-26 DIAGNOSIS — N186 End stage renal disease: Secondary | ICD-10-CM | POA: Diagnosis not present

## 2019-05-26 DIAGNOSIS — D631 Anemia in chronic kidney disease: Secondary | ICD-10-CM | POA: Diagnosis not present

## 2019-05-26 DIAGNOSIS — D509 Iron deficiency anemia, unspecified: Secondary | ICD-10-CM | POA: Diagnosis not present

## 2019-05-26 DIAGNOSIS — E118 Type 2 diabetes mellitus with unspecified complications: Secondary | ICD-10-CM | POA: Diagnosis not present

## 2019-05-28 DIAGNOSIS — Z992 Dependence on renal dialysis: Secondary | ICD-10-CM | POA: Diagnosis not present

## 2019-05-28 DIAGNOSIS — D509 Iron deficiency anemia, unspecified: Secondary | ICD-10-CM | POA: Diagnosis not present

## 2019-05-28 DIAGNOSIS — N186 End stage renal disease: Secondary | ICD-10-CM | POA: Diagnosis not present

## 2019-05-28 DIAGNOSIS — D631 Anemia in chronic kidney disease: Secondary | ICD-10-CM | POA: Diagnosis not present

## 2019-05-28 DIAGNOSIS — E118 Type 2 diabetes mellitus with unspecified complications: Secondary | ICD-10-CM | POA: Diagnosis not present

## 2019-05-28 DIAGNOSIS — E1129 Type 2 diabetes mellitus with other diabetic kidney complication: Secondary | ICD-10-CM | POA: Diagnosis not present

## 2019-05-28 DIAGNOSIS — N2581 Secondary hyperparathyroidism of renal origin: Secondary | ICD-10-CM | POA: Diagnosis not present

## 2019-05-30 DIAGNOSIS — D631 Anemia in chronic kidney disease: Secondary | ICD-10-CM | POA: Diagnosis not present

## 2019-05-30 DIAGNOSIS — N2581 Secondary hyperparathyroidism of renal origin: Secondary | ICD-10-CM | POA: Diagnosis not present

## 2019-05-30 DIAGNOSIS — E118 Type 2 diabetes mellitus with unspecified complications: Secondary | ICD-10-CM | POA: Diagnosis not present

## 2019-05-30 DIAGNOSIS — N186 End stage renal disease: Secondary | ICD-10-CM | POA: Diagnosis not present

## 2019-05-30 DIAGNOSIS — D509 Iron deficiency anemia, unspecified: Secondary | ICD-10-CM | POA: Diagnosis not present

## 2019-06-02 DIAGNOSIS — N186 End stage renal disease: Secondary | ICD-10-CM | POA: Diagnosis not present

## 2019-06-02 DIAGNOSIS — E118 Type 2 diabetes mellitus with unspecified complications: Secondary | ICD-10-CM | POA: Diagnosis not present

## 2019-06-02 DIAGNOSIS — D631 Anemia in chronic kidney disease: Secondary | ICD-10-CM | POA: Diagnosis not present

## 2019-06-02 DIAGNOSIS — N2581 Secondary hyperparathyroidism of renal origin: Secondary | ICD-10-CM | POA: Diagnosis not present

## 2019-06-02 DIAGNOSIS — D509 Iron deficiency anemia, unspecified: Secondary | ICD-10-CM | POA: Diagnosis not present

## 2019-06-04 DIAGNOSIS — N2581 Secondary hyperparathyroidism of renal origin: Secondary | ICD-10-CM | POA: Diagnosis not present

## 2019-06-04 DIAGNOSIS — D631 Anemia in chronic kidney disease: Secondary | ICD-10-CM | POA: Diagnosis not present

## 2019-06-04 DIAGNOSIS — N186 End stage renal disease: Secondary | ICD-10-CM | POA: Diagnosis not present

## 2019-06-04 DIAGNOSIS — D509 Iron deficiency anemia, unspecified: Secondary | ICD-10-CM | POA: Diagnosis not present

## 2019-06-04 DIAGNOSIS — E118 Type 2 diabetes mellitus with unspecified complications: Secondary | ICD-10-CM | POA: Diagnosis not present

## 2019-06-06 DIAGNOSIS — E118 Type 2 diabetes mellitus with unspecified complications: Secondary | ICD-10-CM | POA: Diagnosis not present

## 2019-06-06 DIAGNOSIS — N2581 Secondary hyperparathyroidism of renal origin: Secondary | ICD-10-CM | POA: Diagnosis not present

## 2019-06-06 DIAGNOSIS — N186 End stage renal disease: Secondary | ICD-10-CM | POA: Diagnosis not present

## 2019-06-06 DIAGNOSIS — D509 Iron deficiency anemia, unspecified: Secondary | ICD-10-CM | POA: Diagnosis not present

## 2019-06-06 DIAGNOSIS — D631 Anemia in chronic kidney disease: Secondary | ICD-10-CM | POA: Diagnosis not present

## 2019-06-09 DIAGNOSIS — E118 Type 2 diabetes mellitus with unspecified complications: Secondary | ICD-10-CM | POA: Diagnosis not present

## 2019-06-09 DIAGNOSIS — N2581 Secondary hyperparathyroidism of renal origin: Secondary | ICD-10-CM | POA: Diagnosis not present

## 2019-06-09 DIAGNOSIS — N186 End stage renal disease: Secondary | ICD-10-CM | POA: Diagnosis not present

## 2019-06-09 DIAGNOSIS — D509 Iron deficiency anemia, unspecified: Secondary | ICD-10-CM | POA: Diagnosis not present

## 2019-06-09 DIAGNOSIS — D631 Anemia in chronic kidney disease: Secondary | ICD-10-CM | POA: Diagnosis not present

## 2019-06-11 DIAGNOSIS — N2581 Secondary hyperparathyroidism of renal origin: Secondary | ICD-10-CM | POA: Diagnosis not present

## 2019-06-11 DIAGNOSIS — D509 Iron deficiency anemia, unspecified: Secondary | ICD-10-CM | POA: Diagnosis not present

## 2019-06-11 DIAGNOSIS — N186 End stage renal disease: Secondary | ICD-10-CM | POA: Diagnosis not present

## 2019-06-11 DIAGNOSIS — D631 Anemia in chronic kidney disease: Secondary | ICD-10-CM | POA: Diagnosis not present

## 2019-06-11 DIAGNOSIS — E118 Type 2 diabetes mellitus with unspecified complications: Secondary | ICD-10-CM | POA: Diagnosis not present

## 2019-06-13 DIAGNOSIS — N186 End stage renal disease: Secondary | ICD-10-CM | POA: Diagnosis not present

## 2019-06-13 DIAGNOSIS — D509 Iron deficiency anemia, unspecified: Secondary | ICD-10-CM | POA: Diagnosis not present

## 2019-06-13 DIAGNOSIS — D631 Anemia in chronic kidney disease: Secondary | ICD-10-CM | POA: Diagnosis not present

## 2019-06-13 DIAGNOSIS — E118 Type 2 diabetes mellitus with unspecified complications: Secondary | ICD-10-CM | POA: Diagnosis not present

## 2019-06-13 DIAGNOSIS — N2581 Secondary hyperparathyroidism of renal origin: Secondary | ICD-10-CM | POA: Diagnosis not present

## 2019-06-16 DIAGNOSIS — E118 Type 2 diabetes mellitus with unspecified complications: Secondary | ICD-10-CM | POA: Diagnosis not present

## 2019-06-16 DIAGNOSIS — D509 Iron deficiency anemia, unspecified: Secondary | ICD-10-CM | POA: Diagnosis not present

## 2019-06-16 DIAGNOSIS — N2581 Secondary hyperparathyroidism of renal origin: Secondary | ICD-10-CM | POA: Diagnosis not present

## 2019-06-16 DIAGNOSIS — N186 End stage renal disease: Secondary | ICD-10-CM | POA: Diagnosis not present

## 2019-06-16 DIAGNOSIS — D631 Anemia in chronic kidney disease: Secondary | ICD-10-CM | POA: Diagnosis not present

## 2019-06-18 DIAGNOSIS — D631 Anemia in chronic kidney disease: Secondary | ICD-10-CM | POA: Diagnosis not present

## 2019-06-18 DIAGNOSIS — E118 Type 2 diabetes mellitus with unspecified complications: Secondary | ICD-10-CM | POA: Diagnosis not present

## 2019-06-18 DIAGNOSIS — D509 Iron deficiency anemia, unspecified: Secondary | ICD-10-CM | POA: Diagnosis not present

## 2019-06-18 DIAGNOSIS — N2581 Secondary hyperparathyroidism of renal origin: Secondary | ICD-10-CM | POA: Diagnosis not present

## 2019-06-18 DIAGNOSIS — N186 End stage renal disease: Secondary | ICD-10-CM | POA: Diagnosis not present

## 2019-06-20 DIAGNOSIS — D509 Iron deficiency anemia, unspecified: Secondary | ICD-10-CM | POA: Diagnosis not present

## 2019-06-20 DIAGNOSIS — D631 Anemia in chronic kidney disease: Secondary | ICD-10-CM | POA: Diagnosis not present

## 2019-06-20 DIAGNOSIS — N186 End stage renal disease: Secondary | ICD-10-CM | POA: Diagnosis not present

## 2019-06-20 DIAGNOSIS — N2581 Secondary hyperparathyroidism of renal origin: Secondary | ICD-10-CM | POA: Diagnosis not present

## 2019-06-20 DIAGNOSIS — E118 Type 2 diabetes mellitus with unspecified complications: Secondary | ICD-10-CM | POA: Diagnosis not present

## 2019-06-23 DIAGNOSIS — E118 Type 2 diabetes mellitus with unspecified complications: Secondary | ICD-10-CM | POA: Diagnosis not present

## 2019-06-23 DIAGNOSIS — D631 Anemia in chronic kidney disease: Secondary | ICD-10-CM | POA: Diagnosis not present

## 2019-06-23 DIAGNOSIS — D509 Iron deficiency anemia, unspecified: Secondary | ICD-10-CM | POA: Diagnosis not present

## 2019-06-23 DIAGNOSIS — N186 End stage renal disease: Secondary | ICD-10-CM | POA: Diagnosis not present

## 2019-06-23 DIAGNOSIS — N2581 Secondary hyperparathyroidism of renal origin: Secondary | ICD-10-CM | POA: Diagnosis not present

## 2019-06-25 DIAGNOSIS — N186 End stage renal disease: Secondary | ICD-10-CM | POA: Diagnosis not present

## 2019-06-25 DIAGNOSIS — E118 Type 2 diabetes mellitus with unspecified complications: Secondary | ICD-10-CM | POA: Diagnosis not present

## 2019-06-25 DIAGNOSIS — D509 Iron deficiency anemia, unspecified: Secondary | ICD-10-CM | POA: Diagnosis not present

## 2019-06-25 DIAGNOSIS — D631 Anemia in chronic kidney disease: Secondary | ICD-10-CM | POA: Diagnosis not present

## 2019-06-25 DIAGNOSIS — N2581 Secondary hyperparathyroidism of renal origin: Secondary | ICD-10-CM | POA: Diagnosis not present

## 2019-06-27 DIAGNOSIS — D631 Anemia in chronic kidney disease: Secondary | ICD-10-CM | POA: Diagnosis not present

## 2019-06-27 DIAGNOSIS — N2581 Secondary hyperparathyroidism of renal origin: Secondary | ICD-10-CM | POA: Diagnosis not present

## 2019-06-27 DIAGNOSIS — D509 Iron deficiency anemia, unspecified: Secondary | ICD-10-CM | POA: Diagnosis not present

## 2019-06-27 DIAGNOSIS — N186 End stage renal disease: Secondary | ICD-10-CM | POA: Diagnosis not present

## 2019-06-27 DIAGNOSIS — E118 Type 2 diabetes mellitus with unspecified complications: Secondary | ICD-10-CM | POA: Diagnosis not present

## 2019-06-28 DIAGNOSIS — E1129 Type 2 diabetes mellitus with other diabetic kidney complication: Secondary | ICD-10-CM | POA: Diagnosis not present

## 2019-06-28 DIAGNOSIS — Z992 Dependence on renal dialysis: Secondary | ICD-10-CM | POA: Diagnosis not present

## 2019-06-28 DIAGNOSIS — N186 End stage renal disease: Secondary | ICD-10-CM | POA: Diagnosis not present

## 2019-06-30 DIAGNOSIS — Z992 Dependence on renal dialysis: Secondary | ICD-10-CM | POA: Diagnosis not present

## 2019-06-30 DIAGNOSIS — E118 Type 2 diabetes mellitus with unspecified complications: Secondary | ICD-10-CM | POA: Diagnosis not present

## 2019-06-30 DIAGNOSIS — D509 Iron deficiency anemia, unspecified: Secondary | ICD-10-CM | POA: Diagnosis not present

## 2019-06-30 DIAGNOSIS — D631 Anemia in chronic kidney disease: Secondary | ICD-10-CM | POA: Diagnosis not present

## 2019-06-30 DIAGNOSIS — N2581 Secondary hyperparathyroidism of renal origin: Secondary | ICD-10-CM | POA: Diagnosis not present

## 2019-06-30 DIAGNOSIS — N186 End stage renal disease: Secondary | ICD-10-CM | POA: Diagnosis not present

## 2019-07-02 DIAGNOSIS — D509 Iron deficiency anemia, unspecified: Secondary | ICD-10-CM | POA: Diagnosis not present

## 2019-07-02 DIAGNOSIS — N186 End stage renal disease: Secondary | ICD-10-CM | POA: Diagnosis not present

## 2019-07-02 DIAGNOSIS — N2581 Secondary hyperparathyroidism of renal origin: Secondary | ICD-10-CM | POA: Diagnosis not present

## 2019-07-02 DIAGNOSIS — Z992 Dependence on renal dialysis: Secondary | ICD-10-CM | POA: Diagnosis not present

## 2019-07-02 DIAGNOSIS — D631 Anemia in chronic kidney disease: Secondary | ICD-10-CM | POA: Diagnosis not present

## 2019-07-02 DIAGNOSIS — E118 Type 2 diabetes mellitus with unspecified complications: Secondary | ICD-10-CM | POA: Diagnosis not present

## 2019-07-04 DIAGNOSIS — E118 Type 2 diabetes mellitus with unspecified complications: Secondary | ICD-10-CM | POA: Diagnosis not present

## 2019-07-04 DIAGNOSIS — Z992 Dependence on renal dialysis: Secondary | ICD-10-CM | POA: Diagnosis not present

## 2019-07-04 DIAGNOSIS — D509 Iron deficiency anemia, unspecified: Secondary | ICD-10-CM | POA: Diagnosis not present

## 2019-07-04 DIAGNOSIS — D631 Anemia in chronic kidney disease: Secondary | ICD-10-CM | POA: Diagnosis not present

## 2019-07-04 DIAGNOSIS — N186 End stage renal disease: Secondary | ICD-10-CM | POA: Diagnosis not present

## 2019-07-04 DIAGNOSIS — N2581 Secondary hyperparathyroidism of renal origin: Secondary | ICD-10-CM | POA: Diagnosis not present

## 2019-07-07 DIAGNOSIS — E118 Type 2 diabetes mellitus with unspecified complications: Secondary | ICD-10-CM | POA: Diagnosis not present

## 2019-07-07 DIAGNOSIS — Z992 Dependence on renal dialysis: Secondary | ICD-10-CM | POA: Diagnosis not present

## 2019-07-07 DIAGNOSIS — N186 End stage renal disease: Secondary | ICD-10-CM | POA: Diagnosis not present

## 2019-07-07 DIAGNOSIS — D509 Iron deficiency anemia, unspecified: Secondary | ICD-10-CM | POA: Diagnosis not present

## 2019-07-07 DIAGNOSIS — D631 Anemia in chronic kidney disease: Secondary | ICD-10-CM | POA: Diagnosis not present

## 2019-07-07 DIAGNOSIS — N2581 Secondary hyperparathyroidism of renal origin: Secondary | ICD-10-CM | POA: Diagnosis not present

## 2019-07-09 DIAGNOSIS — Z992 Dependence on renal dialysis: Secondary | ICD-10-CM | POA: Diagnosis not present

## 2019-07-09 DIAGNOSIS — D631 Anemia in chronic kidney disease: Secondary | ICD-10-CM | POA: Diagnosis not present

## 2019-07-09 DIAGNOSIS — E118 Type 2 diabetes mellitus with unspecified complications: Secondary | ICD-10-CM | POA: Diagnosis not present

## 2019-07-09 DIAGNOSIS — N2581 Secondary hyperparathyroidism of renal origin: Secondary | ICD-10-CM | POA: Diagnosis not present

## 2019-07-09 DIAGNOSIS — D509 Iron deficiency anemia, unspecified: Secondary | ICD-10-CM | POA: Diagnosis not present

## 2019-07-09 DIAGNOSIS — N186 End stage renal disease: Secondary | ICD-10-CM | POA: Diagnosis not present

## 2019-07-12 DIAGNOSIS — D631 Anemia in chronic kidney disease: Secondary | ICD-10-CM | POA: Diagnosis not present

## 2019-07-12 DIAGNOSIS — N2581 Secondary hyperparathyroidism of renal origin: Secondary | ICD-10-CM | POA: Diagnosis not present

## 2019-07-12 DIAGNOSIS — D509 Iron deficiency anemia, unspecified: Secondary | ICD-10-CM | POA: Diagnosis not present

## 2019-07-12 DIAGNOSIS — E118 Type 2 diabetes mellitus with unspecified complications: Secondary | ICD-10-CM | POA: Diagnosis not present

## 2019-07-12 DIAGNOSIS — Z992 Dependence on renal dialysis: Secondary | ICD-10-CM | POA: Diagnosis not present

## 2019-07-12 DIAGNOSIS — N186 End stage renal disease: Secondary | ICD-10-CM | POA: Diagnosis not present

## 2019-07-14 DIAGNOSIS — D509 Iron deficiency anemia, unspecified: Secondary | ICD-10-CM | POA: Diagnosis not present

## 2019-07-14 DIAGNOSIS — D631 Anemia in chronic kidney disease: Secondary | ICD-10-CM | POA: Diagnosis not present

## 2019-07-14 DIAGNOSIS — Z992 Dependence on renal dialysis: Secondary | ICD-10-CM | POA: Diagnosis not present

## 2019-07-14 DIAGNOSIS — E118 Type 2 diabetes mellitus with unspecified complications: Secondary | ICD-10-CM | POA: Diagnosis not present

## 2019-07-14 DIAGNOSIS — N2581 Secondary hyperparathyroidism of renal origin: Secondary | ICD-10-CM | POA: Diagnosis not present

## 2019-07-14 DIAGNOSIS — N186 End stage renal disease: Secondary | ICD-10-CM | POA: Diagnosis not present

## 2019-07-16 DIAGNOSIS — N186 End stage renal disease: Secondary | ICD-10-CM | POA: Diagnosis not present

## 2019-07-16 DIAGNOSIS — D509 Iron deficiency anemia, unspecified: Secondary | ICD-10-CM | POA: Diagnosis not present

## 2019-07-16 DIAGNOSIS — Z992 Dependence on renal dialysis: Secondary | ICD-10-CM | POA: Diagnosis not present

## 2019-07-16 DIAGNOSIS — N2581 Secondary hyperparathyroidism of renal origin: Secondary | ICD-10-CM | POA: Diagnosis not present

## 2019-07-16 DIAGNOSIS — E118 Type 2 diabetes mellitus with unspecified complications: Secondary | ICD-10-CM | POA: Diagnosis not present

## 2019-07-16 DIAGNOSIS — D631 Anemia in chronic kidney disease: Secondary | ICD-10-CM | POA: Diagnosis not present

## 2019-07-18 DIAGNOSIS — D631 Anemia in chronic kidney disease: Secondary | ICD-10-CM | POA: Diagnosis not present

## 2019-07-18 DIAGNOSIS — E118 Type 2 diabetes mellitus with unspecified complications: Secondary | ICD-10-CM | POA: Diagnosis not present

## 2019-07-18 DIAGNOSIS — Z992 Dependence on renal dialysis: Secondary | ICD-10-CM | POA: Diagnosis not present

## 2019-07-18 DIAGNOSIS — D509 Iron deficiency anemia, unspecified: Secondary | ICD-10-CM | POA: Diagnosis not present

## 2019-07-18 DIAGNOSIS — N2581 Secondary hyperparathyroidism of renal origin: Secondary | ICD-10-CM | POA: Diagnosis not present

## 2019-07-18 DIAGNOSIS — N186 End stage renal disease: Secondary | ICD-10-CM | POA: Diagnosis not present

## 2019-07-21 DIAGNOSIS — Z992 Dependence on renal dialysis: Secondary | ICD-10-CM | POA: Diagnosis not present

## 2019-07-21 DIAGNOSIS — N2581 Secondary hyperparathyroidism of renal origin: Secondary | ICD-10-CM | POA: Diagnosis not present

## 2019-07-21 DIAGNOSIS — E118 Type 2 diabetes mellitus with unspecified complications: Secondary | ICD-10-CM | POA: Diagnosis not present

## 2019-07-21 DIAGNOSIS — D631 Anemia in chronic kidney disease: Secondary | ICD-10-CM | POA: Diagnosis not present

## 2019-07-21 DIAGNOSIS — D509 Iron deficiency anemia, unspecified: Secondary | ICD-10-CM | POA: Diagnosis not present

## 2019-07-21 DIAGNOSIS — N186 End stage renal disease: Secondary | ICD-10-CM | POA: Diagnosis not present

## 2019-07-21 NOTE — Progress Notes (Signed)
FOLLOW UP  Assessment and Plan:   Chronic diastolic heart failure ([place])       Followed by cardiology; edema resolved since dialysis  ESRD on dialysis [facility]) Doing well with dialysis after extensive fistula access issues; MWF; followed by Dr. Joelyn Oms -     CMP WITH GFR  Anemia in chronic kidney disease, on chronic dialysis ([place]) Followed by nephrology -     CBC with Differential/Platelet  Hypertension At goal with current regimen; mainly managed by dialysis Monitor blood pressure at home; patient to call if consistently greater than 130/80 Continue DASH diet.   Reminder to go to the ER if any CP, SOB, nausea, dizziness, severe HA, changes vision/speech, left arm numbness and tingling and jaw pain.  Cholesterol Currently at goal; continue statin Continue low cholesterol diet and exercise.  Check lipid panel.   Diabetes with diabetic chronic kidney disease Continue medication: novolin 70/30 PRN for glucose >180 Continue diet and exercise.  Perform daily foot/skin check, notify office of any concerning changes.  Check A1C  Obesity with co morbidities Long discussion about weight loss, diet, and exercise Recommended diet heavy in fruits and veggies and low in animal meats, cheeses, and dairy products, appropriate calorie intake Discussed ideal weight for height  Will follow up in 3 months  Vitamin D Def At goal at last visit;  Defer Vit D level  Left leg pain at night ? From left knee versus IT band/back Normal pulses, no redness, swelling. Will give exercises, if not better may refer to ortho   Continue diet and meds as discussed. Further disposition pending results of labs. Discussed med's effects and SE's.   Over 30 minutes of exam, counseling, chart review, and critical decision making was performed.   Future Appointments  Date Time Provider [place]  10/30/2019  2:00 PM Unk Pinto, MD [facility] None  01/29/2020  4:15 PM Liane Comber, NP [facility]  None    ----------------------------------------------------------------------------------------------------------------------  HPI 63 y.o. female  presents for 3 month follow up on CHF, hypertension, cholesterol, diabetes, morbid obesity and vitamin D deficiency.   She complains of right sided leg pain at night, lateral right side, feels like a "heating pad on it", will work it and it will get better. Will walk 1/2 mile 2-3 days a week, trying to increase speed, no pain until at night. No lower back pain.   BMI is Body mass index is 37.57 kg/m., she has been working on diet and exercise, has been trying to walk up and down her driveway for exercise with her husband, but limited due to medical conditions and heat.  Wt Readings from Last 3 Encounters:  07/24/19 186 lb (84.4 kg)  04/22/19 184 lb 9.6 oz (83.7 kg)  01/23/19 191 lb 12.8 oz (87 kg)    Her blood pressure has been controlled at home, today their BP is BP: 128/76  She does workout. She denies chest pain, shortness of breath, dizziness.   She is on cholesterol medication (rosuvastatin 20 mg daily) and denies myalgias. Her cholesterol is not at goal. The cholesterol last visit was:   Lab Results  Component Value Date   CHOL 173 04/22/2019   HDL 43 (L) 04/22/2019   LDLCALC 98 04/22/2019   TRIG 208 (H) 04/22/2019   CHOLHDL 4.0 04/22/2019    She has been working on diet and exercise for T2 diabetes (treated by novolin 70/30 PRN for sugars 160+, and denies foot ulcerations, increased appetite, nausea, paresthesia of the feet, polydipsia,  polyuria, visual disturbances, vomiting and weight loss. Last A1C in the office was:  Lab Results  Component Value Date   HGBA1C 6.3 (H) 04/22/2019   She has ESRD on dialysis, had yesterday, she is hoping to get on kidney transplant list at [facility]:  Lab Results  Component Value Date   GFRAA 6 (L) 04/22/2019   Patient is on Vitamin D supplement.   Lab Results  Component Value Date   VD25OH  32 04/22/2019        Current Medications:  Current Outpatient Medications on File Prior to Visit  Medication Sig  . acetaminophen (TYLENOL) 500 MG tablet Take 1,000 mg by mouth every 6 (six) hours as needed (for pain.).  Marland Kitchen aspirin EC 81 MG tablet Take 81 mg by mouth daily.  Marland Kitchen azelastine (ASTELIN) 0.1 % nasal spray Use 1 to 2 sprays each nostril 2 to 3 x / day  . busPIRone (BUSPAR) 10 MG tablet TAKE 1 TABLET BY MOUTH TWICE DAILY (Patient taking differently: Take 10 mg by mouth 2 (two) times daily. )  . cinacalcet (SENSIPAR) 60 MG tablet Take 1 tablet (60 mg total) by mouth daily.  . insulin aspart protamine - aspart (NOVOLOG MIX 70/30 FLEXPEN) (70-30) 100 UNIT/ML FlexPen Inject 0.1 mLs (10 Units total) into the skin 2 (two) times daily. (Patient taking differently: Inject 10 Units into the skin 2 (two) times daily as needed (if blood sugar is above 180). )  . latanoprost (XALATAN) 0.005 % ophthalmic solution Place 1 drop into both eyes daily.  Marland Kitchen losartan (COZAAR) 50 MG tablet Take 1 tablet Daily for BP  . Multiple Vitamins-Minerals (ZINC PO) Take by mouth.  . ondansetron (ZOFRAN ODT) 4 MG disintegrating tablet Take 1 tablet (4 mg total) by mouth every 8 (eight) hours as needed for nausea or vomiting.  Marland Kitchen RENVELA 800 MG tablet Take 1,600-4,000 mg by mouth 3 (three) times daily. 4000 mg (5 caps) with meals and 1600 mg (2 caps) with snacks  . rosuvastatin (CRESTOR) 20 MG tablet TAKE 1 TABLET BY MOUTH ONCE DAILY (Patient taking differently: Take 20 mg by mouth daily. )  . sertraline (ZOLOFT) 25 MG tablet TAKE 1 TABLET BY MOUTH ONCE DAILY (Patient taking differently: Take 25 mg by mouth daily. )  . carvedilol (COREG) 6.25 MG tablet Take 1 tablet (6.25 mg total) by mouth 2 (two) times daily.   No current facility-administered medications on file prior to visit.      Allergies:  Allergies  Allergen Reactions  . Minoxidil Other (See Comments)    Pericardial effusion  . Lipitor [Atorvastatin]  Other (See Comments)    MYALGIAS > "pain in legs" Tolerates rosuvastatin  . Morphine And Related Itching  . Ace Inhibitors Other (See Comments)    REACTION: "not sure...think it made me drowsy all the time"     Medical History:  Past Medical History:  Diagnosis Date  . Anemia   . Chronic diastolic CHF (congestive heart failure) ([place])   . Chronic diastolic heart failure ([place])   . CKD (chronic kidney disease) stage 4, GFR 15-29 ml/min (HCC)    dialysis M/W/F  . Constipation   . CVA (cerebral infarction) 1997   no residual deficit  . Diverticulitis   . DM (diabetes mellitus) ([place])    type 2  . Heart murmur   . History of cardiovascular stress test    a. Myoview Oct 2012 showed EF 49%, no ischemia, LVE  . HLD (hyperlipidemia)    takes  Crestor daily  . Hypertension   . Pericardial effusion    chronic; felt to be poss related to minoxidil >> DC'd  . Pulmonary hypertension ([place]) 06/18/2015  . Respiratory failure, acute hypoxic, post-operative 07/08/2015   Requiring ECMO support  . S/P cardiac catheterization    a. R/L HC 06/18/15:  mLAD 30%; severe pulmo HTN with PA sat 43%, CI 1.86, prominent V waves indicative of MR; resting hypoxemia O2 sat 86% on RA  . S/P minimally invasive mitral valve repair 07/08/2015   Complex valvuloplasty including artificial Gore-tex neochord placement x10 and 26 mm Sorin Memo 3D Rechord ring annuloplasty via right mini thoracotomy approach  . Severe mitral regurgitation   . Shortness of breath dyspnea   . Stroke ([place]) 1997   no residual effect  . Vitamin D deficiency   . Wears glasses    Family history- Reviewed and unchanged Social history- Reviewed and unchanged   Review of Systems:  Review of Systems  Constitutional: Negative for malaise/fatigue and weight loss.  HENT: Negative for hearing loss and tinnitus.   Eyes: Negative for blurred vision and double vision.  Respiratory: Negative for cough, shortness of breath and wheezing.    Cardiovascular: Negative for chest pain, palpitations, orthopnea, claudication and leg swelling.  Gastrointestinal: Negative for abdominal pain, blood in stool, constipation, diarrhea, heartburn, melena, nausea and vomiting.  Genitourinary: Negative.   Musculoskeletal: Positive for joint pain (left knee pain and left lateral leg pain at night only). Negative for myalgias.  Skin: Negative for rash.  Neurological: Negative for dizziness, tingling, sensory change, weakness and headaches.  Endo/Heme/Allergies: Negative for polydipsia.  Psychiatric/Behavioral: Negative.   All other systems reviewed and are negative.   Physical Exam: BP 128/76   Pulse 100   Temp (!) 97.2 F (36.2 C)   Ht 4\' 11"  (1.499 m)   Wt 186 lb (84.4 kg)   SpO2 95%   BMI 37.57 kg/m  Wt Readings from Last 3 Encounters:  07/24/19 186 lb (84.4 kg)  04/22/19 184 lb 9.6 oz (83.7 kg)  01/23/19 191 lb 12.8 oz (87 kg)   General Appearance: Well nourished, in no apparent distress. Eyes: PERRLA, EOMs, conjunctiva no swelling or erythema Sinuses: No Frontal/maxillary tenderness ENT/Mouth: Ext aud canals clear, TMs without erythema, bulging. No erythema, swelling, or exudate on post pharynx.  Tonsils not swollen or erythematous. Hearing normal.  Neck: Supple, thyroid normal.  Respiratory: Respiratory effort normal, BS equal bilaterally without rales, rhonchi, wheezing or stridor.  Cardio: RRR with no MRGs. Brisk peripheral pulses without edema. Fistula of right forearm with + thrill.  Abdomen: Soft, + BS.  Non tender, no guarding, rebound, hernias, masses. Lymphatics: Non tender without lymphadenopathy.  Musculoskeletal: Full ROM, 5/5 strength, Normal gait. Bilateral knees with crepitus, no notable instability, effusion, erythema.  Skin: Warm, dry without rashes, lesions, ecchymosis.  Neuro: Cranial nerves intact. No cerebellar symptoms.  Psych: Awake and oriented X 3, normal affect, Insight and Judgment appropriate.     Vicie Mutters, PA-C 2:30 PM [facility]

## 2019-07-23 DIAGNOSIS — N186 End stage renal disease: Secondary | ICD-10-CM | POA: Diagnosis not present

## 2019-07-23 DIAGNOSIS — D631 Anemia in chronic kidney disease: Secondary | ICD-10-CM | POA: Diagnosis not present

## 2019-07-23 DIAGNOSIS — N2581 Secondary hyperparathyroidism of renal origin: Secondary | ICD-10-CM | POA: Diagnosis not present

## 2019-07-23 DIAGNOSIS — D509 Iron deficiency anemia, unspecified: Secondary | ICD-10-CM | POA: Diagnosis not present

## 2019-07-23 DIAGNOSIS — Z992 Dependence on renal dialysis: Secondary | ICD-10-CM | POA: Diagnosis not present

## 2019-07-23 DIAGNOSIS — E118 Type 2 diabetes mellitus with unspecified complications: Secondary | ICD-10-CM | POA: Diagnosis not present

## 2019-07-24 ENCOUNTER — Other Ambulatory Visit: Payer: Self-pay

## 2019-07-24 ENCOUNTER — Encounter: Payer: Self-pay | Admitting: Physician Assistant

## 2019-07-24 ENCOUNTER — Ambulatory Visit (INDEPENDENT_AMBULATORY_CARE_PROVIDER_SITE_OTHER): Payer: Medicare Other | Admitting: Physician Assistant

## 2019-07-24 VITALS — BP 128/76 | HR 100 | Temp 97.2°F | Ht 59.0 in | Wt 186.0 lb

## 2019-07-24 DIAGNOSIS — Z992 Dependence on renal dialysis: Secondary | ICD-10-CM | POA: Diagnosis not present

## 2019-07-24 DIAGNOSIS — Z794 Long term (current) use of insulin: Secondary | ICD-10-CM | POA: Diagnosis not present

## 2019-07-24 DIAGNOSIS — I429 Cardiomyopathy, unspecified: Secondary | ICD-10-CM | POA: Diagnosis not present

## 2019-07-24 DIAGNOSIS — Z79899 Other long term (current) drug therapy: Secondary | ICD-10-CM

## 2019-07-24 DIAGNOSIS — I272 Pulmonary hypertension, unspecified: Secondary | ICD-10-CM

## 2019-07-24 DIAGNOSIS — E60 Dietary zinc deficiency: Secondary | ICD-10-CM

## 2019-07-24 DIAGNOSIS — N186 End stage renal disease: Secondary | ICD-10-CM | POA: Diagnosis not present

## 2019-07-24 DIAGNOSIS — I10 Essential (primary) hypertension: Secondary | ICD-10-CM | POA: Diagnosis not present

## 2019-07-24 DIAGNOSIS — E559 Vitamin D deficiency, unspecified: Secondary | ICD-10-CM

## 2019-07-24 DIAGNOSIS — E1169 Type 2 diabetes mellitus with other specified complication: Secondary | ICD-10-CM | POA: Diagnosis not present

## 2019-07-24 DIAGNOSIS — I480 Paroxysmal atrial fibrillation: Secondary | ICD-10-CM | POA: Diagnosis not present

## 2019-07-24 DIAGNOSIS — I5032 Chronic diastolic (congestive) heart failure: Secondary | ICD-10-CM

## 2019-07-24 DIAGNOSIS — F325 Major depressive disorder, single episode, in full remission: Secondary | ICD-10-CM | POA: Diagnosis not present

## 2019-07-24 DIAGNOSIS — E1129 Type 2 diabetes mellitus with other diabetic kidney complication: Secondary | ICD-10-CM

## 2019-07-24 DIAGNOSIS — E785 Hyperlipidemia, unspecified: Secondary | ICD-10-CM

## 2019-07-24 DIAGNOSIS — IMO0001 Reserved for inherently not codable concepts without codable children: Secondary | ICD-10-CM

## 2019-07-24 NOTE — Patient Instructions (Signed)
Iliotibial Band Syndrome Rehab Ask your health care provider which exercises are safe for you. Do exercises exactly as told by your health care provider and adjust them as directed. It is normal to feel mild stretching, pulling, tightness, or discomfort as you do these exercises. Stop right away if you feel sudden pain or your pain gets significantly worse. Do not begin these exercises until told by your health care provider. Stretching and range-of-motion exercises These exercises warm up your muscles and joints and improve the movement and flexibility of your hip and pelvis. Quadriceps stretch, prone  1. Lie on your abdomen on a firm surface, such as a bed or padded floor (prone position). 2. Bend your left / right knee and reach back to hold your ankle or pant leg. If you cannot reach your ankle or pant leg, loop a belt around your foot and grab the belt instead. 3. Gently pull your heel toward your buttocks. Your knee should not slide out to the side. You should feel a stretch in the front of your thigh and knee (quadriceps). 4. Hold this position for __________ seconds. Repeat __________ times. Complete this exercise __________ times a day. Iliotibial band stretch An iliotibial band is a strong band of muscle tissue that runs from the outer side of your hip to the outer side of your thigh and knee. 1. Lie on your side with your left / right leg in the top position. 2. Bend both of your knees and grab your left / right ankle. Stretch out your bottom arm to help you balance. 3. Slowly bring your top knee back so your thigh goes behind your trunk. 4. Slowly lower your top leg toward the floor until you feel a gentle stretch on the outside of your left / right hip and thigh. If you do not feel a stretch and your knee will not fall farther, place the heel of your other foot on top of your knee and pull your knee down toward the floor with your foot. 5. Hold this position for __________ seconds.  Repeat __________ times. Complete this exercise __________ times a day. Strengthening exercises These exercises build strength and endurance in your hip and pelvis. Endurance is the ability to use your muscles for a long time, even after they get tired. Straight leg raises, side-lying This exercise strengthens the muscles that rotate the leg at the hip and move it away from your body (hip abductors). 1. Lie on your side with your left / right leg in the top position. Lie so your head, shoulder, hip, and knee line up. You may bend your bottom knee to help you balance. 2. Roll your hips slightly forward so your hips are stacked directly over each other and your left / right knee is facing forward. 3. Tense the muscles in your outer thigh and lift your top leg 4-6 inches (10-15 cm). 4. Hold this position for __________ seconds. 5. Slowly return to the starting position. Let your muscles relax completely before doing another repetition. Repeat __________ times. Complete this exercise __________ times a day. Leg raises, prone This exercise strengthens the muscles that move the hips (hip extensors). 1. Lie on your abdomen on your bed or a firm surface. You can put a pillow under your hips if that is more comfortable for your lower back. 2. Bend your left / right knee so your foot is straight up in the air. 3. Squeeze your buttocks muscles and lift your left / right thigh   off the bed. Do not let your back arch. 4. Tense your thigh muscle as hard as you can without increasing any knee pain. 5. Hold this position for __________ seconds. 6. Slowly lower your leg to the starting position and allow it to relax completely. Repeat __________ times. Complete this exercise __________ times a day. Hip hike 1. Stand sideways on a bottom step. Stand on your left / right leg with your other foot unsupported next to the step. You can hold on to the railing or wall for balance if needed. 2. Keep your knees straight  and your torso square. Then lift your left / right hip up toward the ceiling. 3. Slowly let your left / right hip lower toward the floor, past the starting position. Your foot should get closer to the floor. Do not lean or bend your knees. Repeat __________ times. Complete this exercise __________ times a day. This information is not intended to replace advice given to you by your health care provider. Make sure you discuss any questions you have with your health care provider. Document Released: 11/13/2005 Document Revised: 03/06/2019 Document Reviewed: 09/04/2018 Elsevier Patient Education  2020 [employer]  

## 2019-07-25 DIAGNOSIS — E118 Type 2 diabetes mellitus with unspecified complications: Secondary | ICD-10-CM | POA: Diagnosis not present

## 2019-07-25 DIAGNOSIS — N2581 Secondary hyperparathyroidism of renal origin: Secondary | ICD-10-CM | POA: Diagnosis not present

## 2019-07-25 DIAGNOSIS — Z992 Dependence on renal dialysis: Secondary | ICD-10-CM | POA: Diagnosis not present

## 2019-07-25 DIAGNOSIS — N186 End stage renal disease: Secondary | ICD-10-CM | POA: Diagnosis not present

## 2019-07-25 DIAGNOSIS — D631 Anemia in chronic kidney disease: Secondary | ICD-10-CM | POA: Diagnosis not present

## 2019-07-25 DIAGNOSIS — D509 Iron deficiency anemia, unspecified: Secondary | ICD-10-CM | POA: Diagnosis not present

## 2019-07-28 ENCOUNTER — Encounter: Payer: Self-pay | Admitting: Physician Assistant

## 2019-07-28 DIAGNOSIS — E118 Type 2 diabetes mellitus with unspecified complications: Secondary | ICD-10-CM | POA: Diagnosis not present

## 2019-07-28 DIAGNOSIS — N186 End stage renal disease: Secondary | ICD-10-CM | POA: Diagnosis not present

## 2019-07-28 DIAGNOSIS — D631 Anemia in chronic kidney disease: Secondary | ICD-10-CM | POA: Diagnosis not present

## 2019-07-28 DIAGNOSIS — Z992 Dependence on renal dialysis: Secondary | ICD-10-CM | POA: Diagnosis not present

## 2019-07-28 DIAGNOSIS — D509 Iron deficiency anemia, unspecified: Secondary | ICD-10-CM | POA: Diagnosis not present

## 2019-07-28 DIAGNOSIS — N2581 Secondary hyperparathyroidism of renal origin: Secondary | ICD-10-CM | POA: Insufficient documentation

## 2019-07-29 ENCOUNTER — Other Ambulatory Visit: Payer: Self-pay | Admitting: Internal Medicine

## 2019-07-29 DIAGNOSIS — N185 Chronic kidney disease, stage 5: Secondary | ICD-10-CM | POA: Diagnosis not present

## 2019-07-29 DIAGNOSIS — N2581 Secondary hyperparathyroidism of renal origin: Secondary | ICD-10-CM | POA: Diagnosis not present

## 2019-07-29 DIAGNOSIS — Z952 Presence of prosthetic heart valve: Secondary | ICD-10-CM | POA: Diagnosis not present

## 2019-07-29 DIAGNOSIS — D631 Anemia in chronic kidney disease: Secondary | ICD-10-CM | POA: Diagnosis not present

## 2019-07-29 DIAGNOSIS — Z992 Dependence on renal dialysis: Secondary | ICD-10-CM | POA: Diagnosis not present

## 2019-07-29 DIAGNOSIS — Z794 Long term (current) use of insulin: Secondary | ICD-10-CM | POA: Diagnosis not present

## 2019-07-29 DIAGNOSIS — Z23 Encounter for immunization: Secondary | ICD-10-CM | POA: Diagnosis not present

## 2019-07-29 DIAGNOSIS — E1129 Type 2 diabetes mellitus with other diabetic kidney complication: Secondary | ICD-10-CM | POA: Diagnosis not present

## 2019-07-29 DIAGNOSIS — Z888 Allergy status to other drugs, medicaments and biological substances status: Secondary | ICD-10-CM | POA: Diagnosis not present

## 2019-07-29 DIAGNOSIS — I272 Pulmonary hypertension, unspecified: Secondary | ICD-10-CM | POA: Diagnosis not present

## 2019-07-29 DIAGNOSIS — Z6836 Body mass index (BMI) 36.0-36.9, adult: Secondary | ICD-10-CM | POA: Diagnosis not present

## 2019-07-29 DIAGNOSIS — I5032 Chronic diastolic (congestive) heart failure: Secondary | ICD-10-CM | POA: Diagnosis not present

## 2019-07-29 DIAGNOSIS — I132 Hypertensive heart and chronic kidney disease with heart failure and with stage 5 chronic kidney disease, or end stage renal disease: Secondary | ICD-10-CM | POA: Diagnosis not present

## 2019-07-29 DIAGNOSIS — E669 Obesity, unspecified: Secondary | ICD-10-CM | POA: Diagnosis not present

## 2019-07-29 DIAGNOSIS — I120 Hypertensive chronic kidney disease with stage 5 chronic kidney disease or end stage renal disease: Secondary | ICD-10-CM | POA: Diagnosis not present

## 2019-07-29 DIAGNOSIS — E118 Type 2 diabetes mellitus with unspecified complications: Secondary | ICD-10-CM | POA: Diagnosis not present

## 2019-07-29 DIAGNOSIS — E119 Type 2 diabetes mellitus without complications: Secondary | ICD-10-CM | POA: Diagnosis not present

## 2019-07-29 DIAGNOSIS — N186 End stage renal disease: Secondary | ICD-10-CM | POA: Diagnosis not present

## 2019-07-29 DIAGNOSIS — E1122 Type 2 diabetes mellitus with diabetic chronic kidney disease: Secondary | ICD-10-CM | POA: Diagnosis not present

## 2019-07-29 LAB — COMPLETE METABOLIC PANEL WITH GFR
AG Ratio: 1.3 (calc) (ref 1.0–2.5)
ALT: 6 U/L (ref 6–29)
AST: 9 U/L — ABNORMAL LOW (ref 10–35)
Albumin: 4.3 g/dL (ref 3.6–5.1)
Alkaline phosphatase (APISO): 49 U/L (ref 37–153)
BUN/Creatinine Ratio: 6 (calc) (ref 6–22)
BUN: 48 mg/dL — ABNORMAL HIGH (ref 7–25)
CO2: 29 mmol/L (ref 20–32)
Calcium: 9.2 mg/dL (ref 8.6–10.4)
Chloride: 92 mmol/L — ABNORMAL LOW (ref 98–110)
Creat: 7.48 mg/dL — ABNORMAL HIGH (ref 0.50–0.99)
GFR, Est African American: 6 mL/min/{1.73_m2} — ABNORMAL LOW (ref >=60)
GFR, Est Non African American: 5 mL/min/{1.73_m2} — ABNORMAL LOW (ref >=60)
Globulin: 3.4 g/dL (calc) (ref 1.9–3.7)
Glucose, Bld: 225 mg/dL — ABNORMAL HIGH (ref 65–99)
Potassium: 3.8 mmol/L (ref 3.5–5.3)
Sodium: 135 mmol/L (ref 135–146)
Total Bilirubin: 0.5 mg/dL (ref 0.2–1.2)
Total Protein: 7.7 g/dL (ref 6.1–8.1)

## 2019-07-29 LAB — CBC WITH DIFFERENTIAL/PLATELET
Absolute Monocytes: 937 cells/uL (ref 200–950)
Basophils Absolute: 43 cells/uL (ref 0–200)
Basophils Relative: 0.6 %
Eosinophils Absolute: 149 cells/uL (ref 15–500)
Eosinophils Relative: 2.1 %
HCT: 32.7 % — ABNORMAL LOW (ref 35.0–45.0)
Hemoglobin: 10.3 g/dL — ABNORMAL LOW (ref 11.7–15.5)
Lymphs Abs: 1278 cells/uL (ref 850–3900)
MCH: 27.5 pg (ref 27.0–33.0)
MCHC: 31.5 g/dL — ABNORMAL LOW (ref 32.0–36.0)
MCV: 87.2 fL (ref 80.0–100.0)
MPV: 11.6 fL (ref 7.5–12.5)
Monocytes Relative: 13.2 %
Neutro Abs: 4693 cells/uL (ref 1500–7800)
Neutrophils Relative %: 66.1 %
Platelets: 222 10*3/uL (ref 140–400)
RBC: 3.75 10*6/uL — ABNORMAL LOW (ref 3.80–5.10)
RDW: 15.9 % — ABNORMAL HIGH (ref 11.0–15.0)
Total Lymphocyte: 18 %
WBC: 7.1 10*3/uL (ref 3.8–10.8)

## 2019-07-29 LAB — MAGNESIUM: Magnesium: 2.2 mg/dL (ref 1.5–2.5)

## 2019-07-29 LAB — LIPID PANEL
Cholesterol: 249 mg/dL — ABNORMAL HIGH (ref <200)
HDL: 53 mg/dL (ref >=50)
LDL Cholesterol (Calc): 172 mg/dL (calc) — ABNORMAL HIGH
Non-HDL Cholesterol (Calc): 196 mg/dL (calc) — ABNORMAL HIGH (ref <130)
Total CHOL/HDL Ratio: 4.7 (calc) (ref <5.0)
Triglycerides: 116 mg/dL (ref <150)

## 2019-07-29 LAB — PTH, INTACT AND CALCIUM
Calcium: 9.2 mg/dL (ref 8.6–10.4)
PTH: 130 pg/mL — ABNORMAL HIGH (ref 14–64)

## 2019-07-29 LAB — HEMOGLOBIN A1C
Hgb A1c MFr Bld: 6.2 % of total Hgb — ABNORMAL HIGH (ref <5.7)
Mean Plasma Glucose: 131 (calc)
eAG (mmol/L): 7.3 (calc)

## 2019-07-29 LAB — VITAMIN D 25 HYDROXY (VIT D DEFICIENCY, FRACTURES): Vit D, 25-Hydroxy: 31 ng/mL (ref 30–100)

## 2019-07-29 LAB — ZINC: Zinc: 69 ug/dL (ref 60–130)

## 2019-07-29 LAB — TSH: TSH: 2 mIU/L (ref 0.40–4.50)

## 2019-07-30 DIAGNOSIS — Z23 Encounter for immunization: Secondary | ICD-10-CM | POA: Diagnosis not present

## 2019-07-30 DIAGNOSIS — E118 Type 2 diabetes mellitus with unspecified complications: Secondary | ICD-10-CM | POA: Diagnosis not present

## 2019-07-30 DIAGNOSIS — D631 Anemia in chronic kidney disease: Secondary | ICD-10-CM | POA: Diagnosis not present

## 2019-07-30 DIAGNOSIS — Z992 Dependence on renal dialysis: Secondary | ICD-10-CM | POA: Diagnosis not present

## 2019-07-30 DIAGNOSIS — N186 End stage renal disease: Secondary | ICD-10-CM | POA: Diagnosis not present

## 2019-07-30 DIAGNOSIS — N2581 Secondary hyperparathyroidism of renal origin: Secondary | ICD-10-CM | POA: Diagnosis not present

## 2019-08-01 DIAGNOSIS — N186 End stage renal disease: Secondary | ICD-10-CM | POA: Diagnosis not present

## 2019-08-01 DIAGNOSIS — Z992 Dependence on renal dialysis: Secondary | ICD-10-CM | POA: Diagnosis not present

## 2019-08-01 DIAGNOSIS — Z23 Encounter for immunization: Secondary | ICD-10-CM | POA: Diagnosis not present

## 2019-08-01 DIAGNOSIS — D631 Anemia in chronic kidney disease: Secondary | ICD-10-CM | POA: Diagnosis not present

## 2019-08-01 DIAGNOSIS — N2581 Secondary hyperparathyroidism of renal origin: Secondary | ICD-10-CM | POA: Diagnosis not present

## 2019-08-01 DIAGNOSIS — E118 Type 2 diabetes mellitus with unspecified complications: Secondary | ICD-10-CM | POA: Diagnosis not present

## 2019-08-04 DIAGNOSIS — Z23 Encounter for immunization: Secondary | ICD-10-CM | POA: Diagnosis not present

## 2019-08-04 DIAGNOSIS — E118 Type 2 diabetes mellitus with unspecified complications: Secondary | ICD-10-CM | POA: Diagnosis not present

## 2019-08-04 DIAGNOSIS — D631 Anemia in chronic kidney disease: Secondary | ICD-10-CM | POA: Diagnosis not present

## 2019-08-04 DIAGNOSIS — Z992 Dependence on renal dialysis: Secondary | ICD-10-CM | POA: Diagnosis not present

## 2019-08-04 DIAGNOSIS — N2581 Secondary hyperparathyroidism of renal origin: Secondary | ICD-10-CM | POA: Diagnosis not present

## 2019-08-04 DIAGNOSIS — N186 End stage renal disease: Secondary | ICD-10-CM | POA: Diagnosis not present

## 2019-08-06 DIAGNOSIS — N2581 Secondary hyperparathyroidism of renal origin: Secondary | ICD-10-CM | POA: Diagnosis not present

## 2019-08-06 DIAGNOSIS — D631 Anemia in chronic kidney disease: Secondary | ICD-10-CM | POA: Diagnosis not present

## 2019-08-06 DIAGNOSIS — Z992 Dependence on renal dialysis: Secondary | ICD-10-CM | POA: Diagnosis not present

## 2019-08-06 DIAGNOSIS — N186 End stage renal disease: Secondary | ICD-10-CM | POA: Diagnosis not present

## 2019-08-06 DIAGNOSIS — E118 Type 2 diabetes mellitus with unspecified complications: Secondary | ICD-10-CM | POA: Diagnosis not present

## 2019-08-06 DIAGNOSIS — Z23 Encounter for immunization: Secondary | ICD-10-CM | POA: Diagnosis not present

## 2019-08-08 DIAGNOSIS — Z992 Dependence on renal dialysis: Secondary | ICD-10-CM | POA: Diagnosis not present

## 2019-08-08 DIAGNOSIS — D631 Anemia in chronic kidney disease: Secondary | ICD-10-CM | POA: Diagnosis not present

## 2019-08-08 DIAGNOSIS — N2581 Secondary hyperparathyroidism of renal origin: Secondary | ICD-10-CM | POA: Diagnosis not present

## 2019-08-08 DIAGNOSIS — Z23 Encounter for immunization: Secondary | ICD-10-CM | POA: Diagnosis not present

## 2019-08-08 DIAGNOSIS — E118 Type 2 diabetes mellitus with unspecified complications: Secondary | ICD-10-CM | POA: Diagnosis not present

## 2019-08-08 DIAGNOSIS — N186 End stage renal disease: Secondary | ICD-10-CM | POA: Diagnosis not present

## 2019-08-11 DIAGNOSIS — E118 Type 2 diabetes mellitus with unspecified complications: Secondary | ICD-10-CM | POA: Diagnosis not present

## 2019-08-11 DIAGNOSIS — Z992 Dependence on renal dialysis: Secondary | ICD-10-CM | POA: Diagnosis not present

## 2019-08-11 DIAGNOSIS — D631 Anemia in chronic kidney disease: Secondary | ICD-10-CM | POA: Diagnosis not present

## 2019-08-11 DIAGNOSIS — N186 End stage renal disease: Secondary | ICD-10-CM | POA: Diagnosis not present

## 2019-08-11 DIAGNOSIS — Z23 Encounter for immunization: Secondary | ICD-10-CM | POA: Diagnosis not present

## 2019-08-11 DIAGNOSIS — N2581 Secondary hyperparathyroidism of renal origin: Secondary | ICD-10-CM | POA: Diagnosis not present

## 2019-08-13 DIAGNOSIS — Z23 Encounter for immunization: Secondary | ICD-10-CM | POA: Diagnosis not present

## 2019-08-13 DIAGNOSIS — E118 Type 2 diabetes mellitus with unspecified complications: Secondary | ICD-10-CM | POA: Diagnosis not present

## 2019-08-13 DIAGNOSIS — D631 Anemia in chronic kidney disease: Secondary | ICD-10-CM | POA: Diagnosis not present

## 2019-08-13 DIAGNOSIS — Z992 Dependence on renal dialysis: Secondary | ICD-10-CM | POA: Diagnosis not present

## 2019-08-13 DIAGNOSIS — N186 End stage renal disease: Secondary | ICD-10-CM | POA: Diagnosis not present

## 2019-08-13 DIAGNOSIS — N2581 Secondary hyperparathyroidism of renal origin: Secondary | ICD-10-CM | POA: Diagnosis not present

## 2019-08-15 DIAGNOSIS — Z992 Dependence on renal dialysis: Secondary | ICD-10-CM | POA: Diagnosis not present

## 2019-08-15 DIAGNOSIS — N186 End stage renal disease: Secondary | ICD-10-CM | POA: Diagnosis not present

## 2019-08-15 DIAGNOSIS — N2581 Secondary hyperparathyroidism of renal origin: Secondary | ICD-10-CM | POA: Diagnosis not present

## 2019-08-15 DIAGNOSIS — E118 Type 2 diabetes mellitus with unspecified complications: Secondary | ICD-10-CM | POA: Diagnosis not present

## 2019-08-15 DIAGNOSIS — Z23 Encounter for immunization: Secondary | ICD-10-CM | POA: Diagnosis not present

## 2019-08-15 DIAGNOSIS — D631 Anemia in chronic kidney disease: Secondary | ICD-10-CM | POA: Diagnosis not present

## 2019-08-18 DIAGNOSIS — N186 End stage renal disease: Secondary | ICD-10-CM | POA: Diagnosis not present

## 2019-08-18 DIAGNOSIS — D631 Anemia in chronic kidney disease: Secondary | ICD-10-CM | POA: Diagnosis not present

## 2019-08-18 DIAGNOSIS — Z992 Dependence on renal dialysis: Secondary | ICD-10-CM | POA: Diagnosis not present

## 2019-08-18 DIAGNOSIS — N2581 Secondary hyperparathyroidism of renal origin: Secondary | ICD-10-CM | POA: Diagnosis not present

## 2019-08-18 DIAGNOSIS — Z23 Encounter for immunization: Secondary | ICD-10-CM | POA: Diagnosis not present

## 2019-08-18 DIAGNOSIS — E118 Type 2 diabetes mellitus with unspecified complications: Secondary | ICD-10-CM | POA: Diagnosis not present

## 2019-08-20 DIAGNOSIS — Z23 Encounter for immunization: Secondary | ICD-10-CM | POA: Diagnosis not present

## 2019-08-20 DIAGNOSIS — D631 Anemia in chronic kidney disease: Secondary | ICD-10-CM | POA: Diagnosis not present

## 2019-08-20 DIAGNOSIS — E118 Type 2 diabetes mellitus with unspecified complications: Secondary | ICD-10-CM | POA: Diagnosis not present

## 2019-08-20 DIAGNOSIS — N2581 Secondary hyperparathyroidism of renal origin: Secondary | ICD-10-CM | POA: Diagnosis not present

## 2019-08-20 DIAGNOSIS — N186 End stage renal disease: Secondary | ICD-10-CM | POA: Diagnosis not present

## 2019-08-20 DIAGNOSIS — Z992 Dependence on renal dialysis: Secondary | ICD-10-CM | POA: Diagnosis not present

## 2019-08-22 DIAGNOSIS — Z992 Dependence on renal dialysis: Secondary | ICD-10-CM | POA: Diagnosis not present

## 2019-08-22 DIAGNOSIS — N186 End stage renal disease: Secondary | ICD-10-CM | POA: Diagnosis not present

## 2019-08-22 DIAGNOSIS — D631 Anemia in chronic kidney disease: Secondary | ICD-10-CM | POA: Diagnosis not present

## 2019-08-22 DIAGNOSIS — Z23 Encounter for immunization: Secondary | ICD-10-CM | POA: Diagnosis not present

## 2019-08-22 DIAGNOSIS — N2581 Secondary hyperparathyroidism of renal origin: Secondary | ICD-10-CM | POA: Diagnosis not present

## 2019-08-22 DIAGNOSIS — E118 Type 2 diabetes mellitus with unspecified complications: Secondary | ICD-10-CM | POA: Diagnosis not present

## 2019-08-25 DIAGNOSIS — Z992 Dependence on renal dialysis: Secondary | ICD-10-CM | POA: Diagnosis not present

## 2019-08-25 DIAGNOSIS — Z23 Encounter for immunization: Secondary | ICD-10-CM | POA: Diagnosis not present

## 2019-08-25 DIAGNOSIS — N186 End stage renal disease: Secondary | ICD-10-CM | POA: Diagnosis not present

## 2019-08-25 DIAGNOSIS — D631 Anemia in chronic kidney disease: Secondary | ICD-10-CM | POA: Diagnosis not present

## 2019-08-25 DIAGNOSIS — N2581 Secondary hyperparathyroidism of renal origin: Secondary | ICD-10-CM | POA: Diagnosis not present

## 2019-08-25 DIAGNOSIS — E118 Type 2 diabetes mellitus with unspecified complications: Secondary | ICD-10-CM | POA: Diagnosis not present

## 2019-08-26 DIAGNOSIS — H40013 Open angle with borderline findings, low risk, bilateral: Secondary | ICD-10-CM | POA: Diagnosis not present

## 2019-08-26 DIAGNOSIS — H5213 Myopia, bilateral: Secondary | ICD-10-CM | POA: Diagnosis not present

## 2019-08-26 LAB — HM DIABETES EYE EXAM

## 2019-08-27 DIAGNOSIS — N186 End stage renal disease: Secondary | ICD-10-CM | POA: Diagnosis not present

## 2019-08-27 DIAGNOSIS — N2581 Secondary hyperparathyroidism of renal origin: Secondary | ICD-10-CM | POA: Diagnosis not present

## 2019-08-27 DIAGNOSIS — D631 Anemia in chronic kidney disease: Secondary | ICD-10-CM | POA: Diagnosis not present

## 2019-08-27 DIAGNOSIS — Z992 Dependence on renal dialysis: Secondary | ICD-10-CM | POA: Diagnosis not present

## 2019-08-27 DIAGNOSIS — Z23 Encounter for immunization: Secondary | ICD-10-CM | POA: Diagnosis not present

## 2019-08-27 DIAGNOSIS — E118 Type 2 diabetes mellitus with unspecified complications: Secondary | ICD-10-CM | POA: Diagnosis not present

## 2019-08-28 ENCOUNTER — Other Ambulatory Visit: Payer: Self-pay | Admitting: Internal Medicine

## 2019-08-28 DIAGNOSIS — Z1231 Encounter for screening mammogram for malignant neoplasm of breast: Secondary | ICD-10-CM

## 2019-08-28 DIAGNOSIS — E1129 Type 2 diabetes mellitus with other diabetic kidney complication: Secondary | ICD-10-CM | POA: Diagnosis not present

## 2019-08-28 DIAGNOSIS — N186 End stage renal disease: Secondary | ICD-10-CM | POA: Diagnosis not present

## 2019-08-28 DIAGNOSIS — Z992 Dependence on renal dialysis: Secondary | ICD-10-CM | POA: Diagnosis not present

## 2019-08-29 DIAGNOSIS — D631 Anemia in chronic kidney disease: Secondary | ICD-10-CM | POA: Diagnosis not present

## 2019-08-29 DIAGNOSIS — N2581 Secondary hyperparathyroidism of renal origin: Secondary | ICD-10-CM | POA: Diagnosis not present

## 2019-08-29 DIAGNOSIS — N186 End stage renal disease: Secondary | ICD-10-CM | POA: Diagnosis not present

## 2019-08-29 DIAGNOSIS — E118 Type 2 diabetes mellitus with unspecified complications: Secondary | ICD-10-CM | POA: Diagnosis not present

## 2019-08-29 DIAGNOSIS — Z992 Dependence on renal dialysis: Secondary | ICD-10-CM | POA: Diagnosis not present

## 2019-09-01 DIAGNOSIS — Z992 Dependence on renal dialysis: Secondary | ICD-10-CM | POA: Diagnosis not present

## 2019-09-01 DIAGNOSIS — N2581 Secondary hyperparathyroidism of renal origin: Secondary | ICD-10-CM | POA: Diagnosis not present

## 2019-09-01 DIAGNOSIS — N186 End stage renal disease: Secondary | ICD-10-CM | POA: Diagnosis not present

## 2019-09-01 DIAGNOSIS — D631 Anemia in chronic kidney disease: Secondary | ICD-10-CM | POA: Diagnosis not present

## 2019-09-01 DIAGNOSIS — E118 Type 2 diabetes mellitus with unspecified complications: Secondary | ICD-10-CM | POA: Diagnosis not present

## 2019-09-03 DIAGNOSIS — E118 Type 2 diabetes mellitus with unspecified complications: Secondary | ICD-10-CM | POA: Diagnosis not present

## 2019-09-03 DIAGNOSIS — Z992 Dependence on renal dialysis: Secondary | ICD-10-CM | POA: Diagnosis not present

## 2019-09-03 DIAGNOSIS — D631 Anemia in chronic kidney disease: Secondary | ICD-10-CM | POA: Diagnosis not present

## 2019-09-03 DIAGNOSIS — N186 End stage renal disease: Secondary | ICD-10-CM | POA: Diagnosis not present

## 2019-09-03 DIAGNOSIS — N2581 Secondary hyperparathyroidism of renal origin: Secondary | ICD-10-CM | POA: Diagnosis not present

## 2019-09-05 DIAGNOSIS — E118 Type 2 diabetes mellitus with unspecified complications: Secondary | ICD-10-CM | POA: Diagnosis not present

## 2019-09-05 DIAGNOSIS — N2581 Secondary hyperparathyroidism of renal origin: Secondary | ICD-10-CM | POA: Diagnosis not present

## 2019-09-05 DIAGNOSIS — N186 End stage renal disease: Secondary | ICD-10-CM | POA: Diagnosis not present

## 2019-09-05 DIAGNOSIS — Z992 Dependence on renal dialysis: Secondary | ICD-10-CM | POA: Diagnosis not present

## 2019-09-05 DIAGNOSIS — D631 Anemia in chronic kidney disease: Secondary | ICD-10-CM | POA: Diagnosis not present

## 2019-09-08 DIAGNOSIS — E118 Type 2 diabetes mellitus with unspecified complications: Secondary | ICD-10-CM | POA: Diagnosis not present

## 2019-09-08 DIAGNOSIS — D631 Anemia in chronic kidney disease: Secondary | ICD-10-CM | POA: Diagnosis not present

## 2019-09-08 DIAGNOSIS — Z992 Dependence on renal dialysis: Secondary | ICD-10-CM | POA: Diagnosis not present

## 2019-09-08 DIAGNOSIS — N186 End stage renal disease: Secondary | ICD-10-CM | POA: Diagnosis not present

## 2019-09-08 DIAGNOSIS — N2581 Secondary hyperparathyroidism of renal origin: Secondary | ICD-10-CM | POA: Diagnosis not present

## 2019-09-10 DIAGNOSIS — Z992 Dependence on renal dialysis: Secondary | ICD-10-CM | POA: Diagnosis not present

## 2019-09-10 DIAGNOSIS — D631 Anemia in chronic kidney disease: Secondary | ICD-10-CM | POA: Diagnosis not present

## 2019-09-10 DIAGNOSIS — N2581 Secondary hyperparathyroidism of renal origin: Secondary | ICD-10-CM | POA: Diagnosis not present

## 2019-09-10 DIAGNOSIS — E118 Type 2 diabetes mellitus with unspecified complications: Secondary | ICD-10-CM | POA: Diagnosis not present

## 2019-09-10 DIAGNOSIS — N186 End stage renal disease: Secondary | ICD-10-CM | POA: Diagnosis not present

## 2019-09-12 DIAGNOSIS — N2581 Secondary hyperparathyroidism of renal origin: Secondary | ICD-10-CM | POA: Diagnosis not present

## 2019-09-12 DIAGNOSIS — N186 End stage renal disease: Secondary | ICD-10-CM | POA: Diagnosis not present

## 2019-09-12 DIAGNOSIS — E118 Type 2 diabetes mellitus with unspecified complications: Secondary | ICD-10-CM | POA: Diagnosis not present

## 2019-09-12 DIAGNOSIS — Z992 Dependence on renal dialysis: Secondary | ICD-10-CM | POA: Diagnosis not present

## 2019-09-12 DIAGNOSIS — D631 Anemia in chronic kidney disease: Secondary | ICD-10-CM | POA: Diagnosis not present

## 2019-09-15 DIAGNOSIS — N186 End stage renal disease: Secondary | ICD-10-CM | POA: Diagnosis not present

## 2019-09-15 DIAGNOSIS — N2581 Secondary hyperparathyroidism of renal origin: Secondary | ICD-10-CM | POA: Diagnosis not present

## 2019-09-15 DIAGNOSIS — D631 Anemia in chronic kidney disease: Secondary | ICD-10-CM | POA: Diagnosis not present

## 2019-09-15 DIAGNOSIS — Z992 Dependence on renal dialysis: Secondary | ICD-10-CM | POA: Diagnosis not present

## 2019-09-15 DIAGNOSIS — E118 Type 2 diabetes mellitus with unspecified complications: Secondary | ICD-10-CM | POA: Diagnosis not present

## 2019-09-17 DIAGNOSIS — E118 Type 2 diabetes mellitus with unspecified complications: Secondary | ICD-10-CM | POA: Diagnosis not present

## 2019-09-17 DIAGNOSIS — N2581 Secondary hyperparathyroidism of renal origin: Secondary | ICD-10-CM | POA: Diagnosis not present

## 2019-09-17 DIAGNOSIS — D631 Anemia in chronic kidney disease: Secondary | ICD-10-CM | POA: Diagnosis not present

## 2019-09-17 DIAGNOSIS — Z992 Dependence on renal dialysis: Secondary | ICD-10-CM | POA: Diagnosis not present

## 2019-09-17 DIAGNOSIS — N186 End stage renal disease: Secondary | ICD-10-CM | POA: Diagnosis not present

## 2019-09-18 ENCOUNTER — Ambulatory Visit
Admission: RE | Admit: 2019-09-18 | Discharge: 2019-09-18 | Disposition: A | Discharge status: Home / Self Care | Payer: Medicare Other | Source: Ambulatory Visit | Attending: Internal Medicine | Admitting: Internal Medicine

## 2019-09-18 ENCOUNTER — Other Ambulatory Visit: Payer: Self-pay

## 2019-09-18 DIAGNOSIS — Z1231 Encounter for screening mammogram for malignant neoplasm of breast: Secondary | ICD-10-CM

## 2019-09-19 DIAGNOSIS — E118 Type 2 diabetes mellitus with unspecified complications: Secondary | ICD-10-CM | POA: Diagnosis not present

## 2019-09-19 DIAGNOSIS — D631 Anemia in chronic kidney disease: Secondary | ICD-10-CM | POA: Diagnosis not present

## 2019-09-19 DIAGNOSIS — N186 End stage renal disease: Secondary | ICD-10-CM | POA: Diagnosis not present

## 2019-09-19 DIAGNOSIS — Z992 Dependence on renal dialysis: Secondary | ICD-10-CM | POA: Diagnosis not present

## 2019-09-19 DIAGNOSIS — N2581 Secondary hyperparathyroidism of renal origin: Secondary | ICD-10-CM | POA: Diagnosis not present

## 2019-09-22 ENCOUNTER — Encounter: Payer: Self-pay | Admitting: Internal Medicine

## 2019-09-22 DIAGNOSIS — Z992 Dependence on renal dialysis: Secondary | ICD-10-CM | POA: Diagnosis not present

## 2019-09-22 DIAGNOSIS — D631 Anemia in chronic kidney disease: Secondary | ICD-10-CM | POA: Diagnosis not present

## 2019-09-22 DIAGNOSIS — N2581 Secondary hyperparathyroidism of renal origin: Secondary | ICD-10-CM | POA: Diagnosis not present

## 2019-09-22 DIAGNOSIS — N186 End stage renal disease: Secondary | ICD-10-CM | POA: Diagnosis not present

## 2019-09-22 DIAGNOSIS — E118 Type 2 diabetes mellitus with unspecified complications: Secondary | ICD-10-CM | POA: Diagnosis not present

## 2019-09-24 DIAGNOSIS — E118 Type 2 diabetes mellitus with unspecified complications: Secondary | ICD-10-CM | POA: Diagnosis not present

## 2019-09-24 DIAGNOSIS — N186 End stage renal disease: Secondary | ICD-10-CM | POA: Diagnosis not present

## 2019-09-24 DIAGNOSIS — D631 Anemia in chronic kidney disease: Secondary | ICD-10-CM | POA: Diagnosis not present

## 2019-09-24 DIAGNOSIS — Z992 Dependence on renal dialysis: Secondary | ICD-10-CM | POA: Diagnosis not present

## 2019-09-24 DIAGNOSIS — N2581 Secondary hyperparathyroidism of renal origin: Secondary | ICD-10-CM | POA: Diagnosis not present

## 2019-09-26 DIAGNOSIS — N2581 Secondary hyperparathyroidism of renal origin: Secondary | ICD-10-CM | POA: Diagnosis not present

## 2019-09-26 DIAGNOSIS — D631 Anemia in chronic kidney disease: Secondary | ICD-10-CM | POA: Diagnosis not present

## 2019-09-26 DIAGNOSIS — N186 End stage renal disease: Secondary | ICD-10-CM | POA: Diagnosis not present

## 2019-09-26 DIAGNOSIS — E118 Type 2 diabetes mellitus with unspecified complications: Secondary | ICD-10-CM | POA: Diagnosis not present

## 2019-09-26 DIAGNOSIS — Z992 Dependence on renal dialysis: Secondary | ICD-10-CM | POA: Diagnosis not present

## 2019-09-28 DIAGNOSIS — E1129 Type 2 diabetes mellitus with other diabetic kidney complication: Secondary | ICD-10-CM | POA: Diagnosis not present

## 2019-09-28 DIAGNOSIS — N186 End stage renal disease: Secondary | ICD-10-CM | POA: Diagnosis not present

## 2019-09-28 DIAGNOSIS — Z992 Dependence on renal dialysis: Secondary | ICD-10-CM | POA: Diagnosis not present

## 2019-09-29 DIAGNOSIS — N186 End stage renal disease: Secondary | ICD-10-CM | POA: Diagnosis not present

## 2019-09-29 DIAGNOSIS — Z992 Dependence on renal dialysis: Secondary | ICD-10-CM | POA: Diagnosis not present

## 2019-09-29 DIAGNOSIS — N2581 Secondary hyperparathyroidism of renal origin: Secondary | ICD-10-CM | POA: Diagnosis not present

## 2019-09-29 DIAGNOSIS — D509 Iron deficiency anemia, unspecified: Secondary | ICD-10-CM | POA: Diagnosis not present

## 2019-09-29 DIAGNOSIS — E118 Type 2 diabetes mellitus with unspecified complications: Secondary | ICD-10-CM | POA: Diagnosis not present

## 2019-09-29 DIAGNOSIS — D631 Anemia in chronic kidney disease: Secondary | ICD-10-CM | POA: Diagnosis not present

## 2019-10-01 DIAGNOSIS — D509 Iron deficiency anemia, unspecified: Secondary | ICD-10-CM | POA: Diagnosis not present

## 2019-10-01 DIAGNOSIS — D631 Anemia in chronic kidney disease: Secondary | ICD-10-CM | POA: Diagnosis not present

## 2019-10-01 DIAGNOSIS — N2581 Secondary hyperparathyroidism of renal origin: Secondary | ICD-10-CM | POA: Diagnosis not present

## 2019-10-01 DIAGNOSIS — N186 End stage renal disease: Secondary | ICD-10-CM | POA: Diagnosis not present

## 2019-10-01 DIAGNOSIS — Z992 Dependence on renal dialysis: Secondary | ICD-10-CM | POA: Diagnosis not present

## 2019-10-01 DIAGNOSIS — E118 Type 2 diabetes mellitus with unspecified complications: Secondary | ICD-10-CM | POA: Diagnosis not present

## 2019-10-02 DIAGNOSIS — Z992 Dependence on renal dialysis: Secondary | ICD-10-CM | POA: Diagnosis not present

## 2019-10-02 DIAGNOSIS — I120 Hypertensive chronic kidney disease with stage 5 chronic kidney disease or end stage renal disease: Secondary | ICD-10-CM | POA: Diagnosis not present

## 2019-10-02 DIAGNOSIS — Z6836 Body mass index (BMI) 36.0-36.9, adult: Secondary | ICD-10-CM | POA: Diagnosis not present

## 2019-10-02 DIAGNOSIS — I871 Compression of vein: Secondary | ICD-10-CM | POA: Diagnosis not present

## 2019-10-02 DIAGNOSIS — I272 Pulmonary hypertension, unspecified: Secondary | ICD-10-CM | POA: Diagnosis not present

## 2019-10-02 DIAGNOSIS — E669 Obesity, unspecified: Secondary | ICD-10-CM | POA: Diagnosis not present

## 2019-10-02 DIAGNOSIS — I110 Hypertensive heart disease with heart failure: Secondary | ICD-10-CM | POA: Diagnosis not present

## 2019-10-02 DIAGNOSIS — Z794 Long term (current) use of insulin: Secondary | ICD-10-CM | POA: Diagnosis not present

## 2019-10-02 DIAGNOSIS — N186 End stage renal disease: Secondary | ICD-10-CM | POA: Diagnosis not present

## 2019-10-02 DIAGNOSIS — I5032 Chronic diastolic (congestive) heart failure: Secondary | ICD-10-CM | POA: Diagnosis not present

## 2019-10-02 DIAGNOSIS — E1122 Type 2 diabetes mellitus with diabetic chronic kidney disease: Secondary | ICD-10-CM | POA: Diagnosis not present

## 2019-10-03 DIAGNOSIS — N2581 Secondary hyperparathyroidism of renal origin: Secondary | ICD-10-CM | POA: Diagnosis not present

## 2019-10-03 DIAGNOSIS — D631 Anemia in chronic kidney disease: Secondary | ICD-10-CM | POA: Diagnosis not present

## 2019-10-03 DIAGNOSIS — Z992 Dependence on renal dialysis: Secondary | ICD-10-CM | POA: Diagnosis not present

## 2019-10-03 DIAGNOSIS — D509 Iron deficiency anemia, unspecified: Secondary | ICD-10-CM | POA: Diagnosis not present

## 2019-10-03 DIAGNOSIS — N186 End stage renal disease: Secondary | ICD-10-CM | POA: Diagnosis not present

## 2019-10-03 DIAGNOSIS — E118 Type 2 diabetes mellitus with unspecified complications: Secondary | ICD-10-CM | POA: Diagnosis not present

## 2019-10-06 DIAGNOSIS — Z992 Dependence on renal dialysis: Secondary | ICD-10-CM | POA: Diagnosis not present

## 2019-10-06 DIAGNOSIS — D631 Anemia in chronic kidney disease: Secondary | ICD-10-CM | POA: Diagnosis not present

## 2019-10-06 DIAGNOSIS — N186 End stage renal disease: Secondary | ICD-10-CM | POA: Diagnosis not present

## 2019-10-06 DIAGNOSIS — N2581 Secondary hyperparathyroidism of renal origin: Secondary | ICD-10-CM | POA: Diagnosis not present

## 2019-10-06 DIAGNOSIS — D509 Iron deficiency anemia, unspecified: Secondary | ICD-10-CM | POA: Diagnosis not present

## 2019-10-06 DIAGNOSIS — E118 Type 2 diabetes mellitus with unspecified complications: Secondary | ICD-10-CM | POA: Diagnosis not present

## 2019-10-08 DIAGNOSIS — E118 Type 2 diabetes mellitus with unspecified complications: Secondary | ICD-10-CM | POA: Diagnosis not present

## 2019-10-08 DIAGNOSIS — N186 End stage renal disease: Secondary | ICD-10-CM | POA: Diagnosis not present

## 2019-10-08 DIAGNOSIS — D631 Anemia in chronic kidney disease: Secondary | ICD-10-CM | POA: Diagnosis not present

## 2019-10-08 DIAGNOSIS — D509 Iron deficiency anemia, unspecified: Secondary | ICD-10-CM | POA: Diagnosis not present

## 2019-10-08 DIAGNOSIS — Z992 Dependence on renal dialysis: Secondary | ICD-10-CM | POA: Diagnosis not present

## 2019-10-08 DIAGNOSIS — N2581 Secondary hyperparathyroidism of renal origin: Secondary | ICD-10-CM | POA: Diagnosis not present

## 2019-10-10 DIAGNOSIS — Z992 Dependence on renal dialysis: Secondary | ICD-10-CM | POA: Diagnosis not present

## 2019-10-10 DIAGNOSIS — D509 Iron deficiency anemia, unspecified: Secondary | ICD-10-CM | POA: Diagnosis not present

## 2019-10-10 DIAGNOSIS — N2581 Secondary hyperparathyroidism of renal origin: Secondary | ICD-10-CM | POA: Diagnosis not present

## 2019-10-10 DIAGNOSIS — N186 End stage renal disease: Secondary | ICD-10-CM | POA: Diagnosis not present

## 2019-10-10 DIAGNOSIS — E118 Type 2 diabetes mellitus with unspecified complications: Secondary | ICD-10-CM | POA: Diagnosis not present

## 2019-10-10 DIAGNOSIS — D631 Anemia in chronic kidney disease: Secondary | ICD-10-CM | POA: Diagnosis not present

## 2019-10-13 DIAGNOSIS — N2581 Secondary hyperparathyroidism of renal origin: Secondary | ICD-10-CM | POA: Diagnosis not present

## 2019-10-13 DIAGNOSIS — D631 Anemia in chronic kidney disease: Secondary | ICD-10-CM | POA: Diagnosis not present

## 2019-10-13 DIAGNOSIS — N186 End stage renal disease: Secondary | ICD-10-CM | POA: Diagnosis not present

## 2019-10-13 DIAGNOSIS — Z992 Dependence on renal dialysis: Secondary | ICD-10-CM | POA: Diagnosis not present

## 2019-10-13 DIAGNOSIS — D509 Iron deficiency anemia, unspecified: Secondary | ICD-10-CM | POA: Diagnosis not present

## 2019-10-13 DIAGNOSIS — E118 Type 2 diabetes mellitus with unspecified complications: Secondary | ICD-10-CM | POA: Diagnosis not present

## 2019-10-15 DIAGNOSIS — D509 Iron deficiency anemia, unspecified: Secondary | ICD-10-CM | POA: Diagnosis not present

## 2019-10-15 DIAGNOSIS — N2581 Secondary hyperparathyroidism of renal origin: Secondary | ICD-10-CM | POA: Diagnosis not present

## 2019-10-15 DIAGNOSIS — E118 Type 2 diabetes mellitus with unspecified complications: Secondary | ICD-10-CM | POA: Diagnosis not present

## 2019-10-15 DIAGNOSIS — D631 Anemia in chronic kidney disease: Secondary | ICD-10-CM | POA: Diagnosis not present

## 2019-10-15 DIAGNOSIS — Z992 Dependence on renal dialysis: Secondary | ICD-10-CM | POA: Diagnosis not present

## 2019-10-15 DIAGNOSIS — N186 End stage renal disease: Secondary | ICD-10-CM | POA: Diagnosis not present

## 2019-10-17 DIAGNOSIS — D509 Iron deficiency anemia, unspecified: Secondary | ICD-10-CM | POA: Diagnosis not present

## 2019-10-17 DIAGNOSIS — N2581 Secondary hyperparathyroidism of renal origin: Secondary | ICD-10-CM | POA: Diagnosis not present

## 2019-10-17 DIAGNOSIS — N186 End stage renal disease: Secondary | ICD-10-CM | POA: Diagnosis not present

## 2019-10-17 DIAGNOSIS — Z992 Dependence on renal dialysis: Secondary | ICD-10-CM | POA: Diagnosis not present

## 2019-10-17 DIAGNOSIS — D631 Anemia in chronic kidney disease: Secondary | ICD-10-CM | POA: Diagnosis not present

## 2019-10-17 DIAGNOSIS — E118 Type 2 diabetes mellitus with unspecified complications: Secondary | ICD-10-CM | POA: Diagnosis not present

## 2019-10-19 DIAGNOSIS — Z992 Dependence on renal dialysis: Secondary | ICD-10-CM | POA: Diagnosis not present

## 2019-10-19 DIAGNOSIS — D631 Anemia in chronic kidney disease: Secondary | ICD-10-CM | POA: Diagnosis not present

## 2019-10-19 DIAGNOSIS — E118 Type 2 diabetes mellitus with unspecified complications: Secondary | ICD-10-CM | POA: Diagnosis not present

## 2019-10-19 DIAGNOSIS — N186 End stage renal disease: Secondary | ICD-10-CM | POA: Diagnosis not present

## 2019-10-19 DIAGNOSIS — D509 Iron deficiency anemia, unspecified: Secondary | ICD-10-CM | POA: Diagnosis not present

## 2019-10-19 DIAGNOSIS — N2581 Secondary hyperparathyroidism of renal origin: Secondary | ICD-10-CM | POA: Diagnosis not present

## 2019-10-21 DIAGNOSIS — D631 Anemia in chronic kidney disease: Secondary | ICD-10-CM | POA: Diagnosis not present

## 2019-10-21 DIAGNOSIS — N186 End stage renal disease: Secondary | ICD-10-CM | POA: Diagnosis not present

## 2019-10-21 DIAGNOSIS — Z992 Dependence on renal dialysis: Secondary | ICD-10-CM | POA: Diagnosis not present

## 2019-10-21 DIAGNOSIS — D509 Iron deficiency anemia, unspecified: Secondary | ICD-10-CM | POA: Diagnosis not present

## 2019-10-21 DIAGNOSIS — N2581 Secondary hyperparathyroidism of renal origin: Secondary | ICD-10-CM | POA: Diagnosis not present

## 2019-10-21 DIAGNOSIS — E118 Type 2 diabetes mellitus with unspecified complications: Secondary | ICD-10-CM | POA: Diagnosis not present

## 2019-10-24 DIAGNOSIS — D631 Anemia in chronic kidney disease: Secondary | ICD-10-CM | POA: Diagnosis not present

## 2019-10-24 DIAGNOSIS — N2581 Secondary hyperparathyroidism of renal origin: Secondary | ICD-10-CM | POA: Diagnosis not present

## 2019-10-24 DIAGNOSIS — D509 Iron deficiency anemia, unspecified: Secondary | ICD-10-CM | POA: Diagnosis not present

## 2019-10-24 DIAGNOSIS — Z992 Dependence on renal dialysis: Secondary | ICD-10-CM | POA: Diagnosis not present

## 2019-10-24 DIAGNOSIS — N186 End stage renal disease: Secondary | ICD-10-CM | POA: Diagnosis not present

## 2019-10-24 DIAGNOSIS — E118 Type 2 diabetes mellitus with unspecified complications: Secondary | ICD-10-CM | POA: Diagnosis not present

## 2019-10-27 DIAGNOSIS — Z992 Dependence on renal dialysis: Secondary | ICD-10-CM | POA: Diagnosis not present

## 2019-10-27 DIAGNOSIS — D631 Anemia in chronic kidney disease: Secondary | ICD-10-CM | POA: Diagnosis not present

## 2019-10-27 DIAGNOSIS — E118 Type 2 diabetes mellitus with unspecified complications: Secondary | ICD-10-CM | POA: Diagnosis not present

## 2019-10-27 DIAGNOSIS — N2581 Secondary hyperparathyroidism of renal origin: Secondary | ICD-10-CM | POA: Diagnosis not present

## 2019-10-27 DIAGNOSIS — D509 Iron deficiency anemia, unspecified: Secondary | ICD-10-CM | POA: Diagnosis not present

## 2019-10-27 DIAGNOSIS — N186 End stage renal disease: Secondary | ICD-10-CM | POA: Diagnosis not present

## 2019-10-30 ENCOUNTER — Ambulatory Visit (INDEPENDENT_AMBULATORY_CARE_PROVIDER_SITE_OTHER): Payer: Medicare Other | Admitting: Internal Medicine

## 2019-10-30 ENCOUNTER — Encounter: Payer: Self-pay | Admitting: Internal Medicine

## 2019-10-30 ENCOUNTER — Other Ambulatory Visit: Payer: Self-pay

## 2019-10-30 VITALS — BP 129/79 | HR 91 | Temp 98.4°F | Resp 12 | Ht 59.5 in | Wt 179.0 lb

## 2019-10-30 DIAGNOSIS — E1169 Type 2 diabetes mellitus with other specified complication: Secondary | ICD-10-CM

## 2019-10-30 DIAGNOSIS — E559 Vitamin D deficiency, unspecified: Secondary | ICD-10-CM

## 2019-10-30 DIAGNOSIS — Z79899 Other long term (current) drug therapy: Secondary | ICD-10-CM

## 2019-10-30 DIAGNOSIS — Z794 Long term (current) use of insulin: Secondary | ICD-10-CM | POA: Diagnosis not present

## 2019-10-30 DIAGNOSIS — Z8249 Family history of ischemic heart disease and other diseases of the circulatory system: Secondary | ICD-10-CM

## 2019-10-30 DIAGNOSIS — I10 Essential (primary) hypertension: Secondary | ICD-10-CM

## 2019-10-30 DIAGNOSIS — Z1211 Encounter for screening for malignant neoplasm of colon: Secondary | ICD-10-CM

## 2019-10-30 DIAGNOSIS — E1122 Type 2 diabetes mellitus with diabetic chronic kidney disease: Secondary | ICD-10-CM | POA: Diagnosis not present

## 2019-10-30 DIAGNOSIS — D631 Anemia in chronic kidney disease: Secondary | ICD-10-CM

## 2019-10-30 DIAGNOSIS — E785 Hyperlipidemia, unspecified: Secondary | ICD-10-CM

## 2019-10-30 DIAGNOSIS — I480 Paroxysmal atrial fibrillation: Secondary | ICD-10-CM

## 2019-10-30 DIAGNOSIS — Z992 Dependence on renal dialysis: Secondary | ICD-10-CM | POA: Diagnosis not present

## 2019-10-30 DIAGNOSIS — I5032 Chronic diastolic (congestive) heart failure: Secondary | ICD-10-CM | POA: Diagnosis not present

## 2019-10-30 DIAGNOSIS — N186 End stage renal disease: Secondary | ICD-10-CM

## 2019-10-30 DIAGNOSIS — Z136 Encounter for screening for cardiovascular disorders: Secondary | ICD-10-CM | POA: Diagnosis not present

## 2019-10-30 NOTE — Progress Notes (Signed)
Annual Screening/Preventative Visit & Comprehensive Evaluation &  Examination     This very nice 63 y.o. [name] presents for a Screening /Preventative Visit & comprehensive evaluation and management of multiple medical co-morbidities.  Patient has been followed for HTN, HLD, T2_NIDDM w / ESRD on Dialysis   and Vitamin D Deficiency.      HTN predates since 1999. Patient's BP has been controlled at home and patient denies any cardiac symptoms as chest pain, palpitations, shortness of breath, dizziness or ankle swelling. Today's BP is at goal - 129/79 . In 2016, patient underwent AoVR and had anesthesia complications developing multi-systems failure and was transferred to [facility] for ECMO & Dialysis was initiated and she continues on Dialysis 3 x /week  awaiting a transplant. Her Nephrologist  is Dr Joelyn Oms.     Patient's hyperlipidemia is controlled with diet and medications. Patient denies myalgias or other medication SE's. Last lipids were not at goal:  Lab Results  Component Value Date   CHOL 168 10/30/2019   HDL 43 (L) 10/30/2019   LDLCALC 95 10/30/2019   TRIG 200 (H) 10/30/2019   CHOLHDL 3.9 10/30/2019      Patient has Morbid Obesity (BMI 38+) and hx/o T2_NIDDM predating since 2006 and patient denies reactive hypoglycemic symptoms, visual blurring, diabetic polys or paresthesias.  Initially on Dialysis she was able to taper off of her insulin by March of this year, but subsequently had to restart taking it very infrequently.  Last A1c was not at goal:  Lab Results  Component Value Date   HGBA1C 6.2 (H) 10/30/2019      Finally, patient has history of Vitamin D Deficiency ("12" / 2008)  and last Vitamin D was still very low :  Lab Results  Component Value Date   VD25OH 36 10/30/2019   Current Outpatient Medications on File Prior to Visit  Medication Sig  . acetaminophen (TYLENOL) 500 MG tablet Take 1,000 mg by mouth every 6 (six) hours as needed (for pain.).  Marland Kitchen aspirin EC 81 MG  tablet Take 81 mg by mouth daily.  Marland Kitchen azelastine (ASTELIN) 0.1 % nasal spray Use 1 to 2 sprays each nostril 2 to 3 x / day  . busPIRone (BUSPAR) 10 MG tablet TAKE 1 TABLET BY MOUTH TWICE DAILY (Patient taking differently: Take 10 mg by mouth 2 (two) times daily. )  . cinacalcet (SENSIPAR) 60 MG tablet Take 1 tablet (60 mg total) by mouth daily.  . insulin aspart protamine - aspart (NOVOLOG MIX 70/30 FLEXPEN) (70-30) 100 UNIT/ML FlexPen Inject 0.1 mLs (10 Units total) into the skin 2 (two) times daily. (Patient taking differently: Inject 10 Units into the skin 2 (two) times daily as needed (if blood sugar is above 180). )  . latanoprost (XALATAN) 0.005 % ophthalmic solution Place 1 drop into both eyes daily.  Marland Kitchen losartan (COZAAR) 50 MG tablet Take 1 tablet Daily for BP  . Multiple Vitamins-Minerals (ZINC PO) Take by mouth.  . ondansetron (ZOFRAN ODT) 4 MG disintegrating tablet Take 1 tablet (4 mg total) by mouth every 8 (eight) hours as needed for nausea or vomiting.  Marland Kitchen RENVELA 800 MG tablet Take 1,600-4,000 mg by mouth 3 (three) times daily. 4000 mg (5 caps) with meals and 1600 mg (2 caps) with snacks  . rosuvastatin (CRESTOR) 20 MG tablet TAKE 1 TABLET BY MOUTH ONCE DAILY (Patient taking differently: Take 20 mg by mouth daily. )  . sertraline (ZOLOFT) 25 MG tablet Take 1 tablet Daily for Mood  .  carvedilol (COREG) 6.25 MG tablet Take 1 tablet (6.25 mg total) by mouth 2 (two) times daily.   No current facility-administered medications on file prior to visit.    Allergies  Allergen Reactions  . Minoxidil Other (See Comments)    Pericardial effusion  . Lipitor [Atorvastatin] Other (See Comments)    MYALGIAS > "pain in legs" Tolerates rosuvastatin  . Morphine And Related Itching  . Ace Inhibitors Other (See Comments)    REACTION: "not sure...think it made me drowsy all the time"   Past Medical History:  Diagnosis Date  . Anemia   . Chronic diastolic CHF (congestive heart failure) ([place])   .  Chronic diastolic heart failure ([place])   . CKD (chronic kidney disease) stage 4, GFR 15-29 ml/min (HCC)    dialysis M/W/F  . Constipation   . CVA (cerebral infarction) 1997   no residual deficit  . Diverticulitis   . DM (diabetes mellitus) ([place])    type 2  . Heart murmur   . History of cardiovascular stress test    a. Myoview Oct 2012 showed EF 49%, no ischemia, LVE  . HLD (hyperlipidemia)    takes Crestor daily  . Hypertension   . Pericardial effusion    chronic; felt to be poss related to minoxidil >> DC'd  . Pulmonary hypertension ([place]) 06/18/2015  . Respiratory failure, acute hypoxic, post-operative 07/08/2015   Requiring ECMO support  . S/P cardiac catheterization    a. R/L HC 06/18/15:  mLAD 30%; severe pulmo HTN with PA sat 43%, CI 1.86, prominent V waves indicative of MR; resting hypoxemia O2 sat 86% on RA  . S/P minimally invasive mitral valve repair 07/08/2015   Complex valvuloplasty including artificial Gore-tex neochord placement x10 and 26 mm Sorin Memo 3D Rechord ring annuloplasty via right mini thoracotomy approach  . Severe mitral regurgitation   . Shortness of breath dyspnea   . Stroke ([place]) 1997   no residual effect  . Vitamin D deficiency   . Wears glasses    Health Maintenance  Topic Date Due  . HEMOGLOBIN A1C  04/29/2020  . OPHTHALMOLOGY EXAM  08/25/2020  . FOOT EXAM  10/29/2020  . MAMMOGRAM  09/17/2021  . PAP SMEAR-Modifier  10/30/2021  . COLONOSCOPY  01/18/2022  . TETANUS/TDAP  01/23/2029  . INFLUENZA VACCINE  Completed  . Hepatitis C Screening  Completed  . HIV Screening  Completed   Immunization History  Administered Date(s) Administered  . Hepatitis B, adult 10/25/2015, 11/24/2015, 12/27/2015  . Influenza Split 08/20/2012, 08/15/2017  . Influenza, Quadrivalent, Recombinant, Inj, Pf 08/25/2019  . PPD Test 03/25/2014, 03/29/2015  . Pneumococcal Conjugate-13 03/19/2019  . Pneumococcal Polysaccharide-23 09/18/2016, 09/11/2017  .  Pneumococcal-Unspecified 07/28/2008  . Td 01/23/2019  . Tdap 12/28/2008   Last Colon - 01/19/2012 - Dr Collene Mares - Recc 10 yr F/U - due Mar 2023  Last MGM - 09/18/2019   Past Surgical History:  Procedure Laterality Date  . A/V FISTULAGRAM Right 08/16/2017   Procedure: A/V Fistulagram;  Surgeon: Conrad [place], MD;  Location: [place] [facility];  Service: Cardiovascular;  Laterality: Right;  . AV FISTULA PLACEMENT Left 10/22/2015   Procedure: RADIOCEPHALIC ARTERIOVENOUS (AV) FISTULA CREATION;  Surgeon: Serafina Mitchell, MD;  Location: [place];  Service: Vascular;  Laterality: Left;  . AV FISTULA PLACEMENT Right 05/22/2017   Procedure: ARTERIOVENOUS (AV) FISTULA CREATION-RIGHT ARM;  Surgeon: Waynetta Sandy, MD;  Location: [place];  Service: Vascular;  Laterality: Right;  . CANNULATION FOR CARDIOPULMONARY  BYPASS N/A 07/08/2015   Procedure: CANNULATION FOR ECMO;  Surgeon: Rexene Alberts, MD;  Location: [place];  Service: Open Heart Surgery;  Laterality: N/A;  . CARDIAC CATHETERIZATION    . CARDIAC CATHETERIZATION N/A 06/18/2015   Procedure: Right/Left Heart Cath and Coronary Angiography;  Surgeon: Jettie Booze, MD;  Location: [place] [facility];  Service: Cardiovascular;  Laterality: N/A;  . CESAREAN SECTION     x 2  . COLONOSCOPY    . EXCHANGE OF A DIALYSIS CATHETER N/A 06/28/2017   Procedure: EXCHANGE OF A DIALYSIS CATHETER;  Surgeon: Waynetta Sandy, MD;  Location: [place];  Service: Vascular;  Laterality: N/A;  . FISTULA SUPERFICIALIZATION Left 12/28/2015   Procedure: SUPERFICIALIZATION LEFT RADIOCEPHALIC FISTULA;  Surgeon: Serafina Mitchell, MD;  Location: [place];  Service: Vascular;  Laterality: Left;  . FISTULA SUPERFICIALIZATION Right 08/21/2017   Procedure: FISTULA SUPERFICIALIZATION RIGHT ARM;  Surgeon: Waynetta Sandy, MD;  Location: [place];  Service: Vascular;  Laterality: Right;  . INSERTION OF DIALYSIS CATHETER Right 04/28/2017   Procedure: INSERTION OF DIALYSIS  CATHETER-RIGHT INTERNAL JUGULAR PLACEMENT;  Surgeon: Elam Dutch, MD;  Location: [place];  Service: Vascular;  Laterality: Right;  . LIGATION OF ARTERIOVENOUS  FISTULA Left 04/28/2017   Procedure: LIGATION OF ARTERIOVENOUS  FISTULA;  Surgeon: Elam Dutch, MD;  Location: [place];  Service: Vascular;  Laterality: Left;  . MITRAL VALVE REPAIR Right 07/08/2015   Procedure: MINIMALLY INVASIVE MITRAL VALVE REPAIR with a 26 Sorin Memo 3D Rechord;  Surgeon: Rexene Alberts, MD;  Location: [place];  Service: Open Heart Surgery;  Laterality: Right;  . MULTIPLE EXTRACTIONS WITH ALVEOLOPLASTY N/A 06/30/2015   Procedure: MULTIPLE EXTRACTIONS OF TOOTH #'S 4  AND 30 WITH ALVEOLOPLASTY AND GROSS DEBRIDEMENT  OF REMAINING TEETH;  Surgeon: Lenn Cal, DDS;  Location: [place];  Service: Oral Surgery;  Laterality: N/A;  . PERIPHERAL VASCULAR CATHETERIZATION Left 03/28/2016   Procedure: A/V Fistulagram;  Surgeon: Serafina Mitchell, MD;  Location: [place] [facility];  Service: Cardiovascular;  Laterality: Left;  lower arm  . TEE WITHOUT CARDIOVERSION N/A 06/08/2015   Procedure: TRANSESOPHAGEAL ECHOCARDIOGRAM (TEE);  Surgeon: Lelon Perla, MD;  Location: [facility];  Service: Cardiovascular;  Laterality: N/A;  . TEE WITHOUT CARDIOVERSION N/A 07/08/2015   Procedure: TRANSESOPHAGEAL ECHOCARDIOGRAM (TEE);  Surgeon: Rexene Alberts, MD;  Location: [place];  Service: Open Heart Surgery;  Laterality: N/A;   Family History  Problem Relation Age of Onset  . Cancer Sister        breast cancer  . Stroke Mother   . Heart attack Brother        MI in his 64s  . Heart disease Brother        before age 52  . Breast cancer Sister    Social History   Tobacco Use  . Smoking status: Never Smoker  . Smokeless tobacco: Never Used  Substance Use Topics  . Alcohol use: No    Alcohol/week: 0.0 standard drinks  . Drug use: No    ROS Constitutional: Denies fever, chills, weight loss/gain, headaches, insomnia,  night sweats,  and change in appetite. Does c/o fatigue. Eyes: Denies redness, blurred vision, diplopia, discharge, itchy, watery eyes.  ENT: Denies discharge, congestion, post nasal drip, epistaxis, sore throat, earache, hearing loss, dental pain, Tinnitus, Vertigo, Sinus pain, snoring.  Cardio: Denies chest pain, palpitations, irregular heartbeat, syncope, dyspnea, diaphoresis, orthopnea, PND, claudication, edema Respiratory: denies cough, dyspnea, DOE, pleurisy,  hoarseness, laryngitis, wheezing.  Gastrointestinal: Denies dysphagia, heartburn, reflux, water brash, pain, cramps, nausea, vomiting, bloating, diarrhea, constipation, hematemesis, melena, hematochezia, jaundice, hemorrhoids Genitourinary: Denies dysuria, frequency, urgency, nocturia, hesitancy, discharge, hematuria, flank pain Breast: Breast lumps, nipple discharge, bleeding.  Musculoskeletal: Denies arthralgia, myalgia, stiffness, Jt. Swelling, pain, limp, and strain/sprain. Denies falls. Skin: Denies puritis, rash, hives, warts, acne, eczema, changing in skin lesion Neuro: No weakness, tremor, incoordination, spasms, paresthesia, pain Psychiatric: Denies confusion, memory loss, sensory loss. Denies Depression. Endocrine: Denies change in weight, skin, hair change, nocturia, and paresthesia, diabetic polys, visual blurring, hyper / hypo glycemic episodes.  Heme/Lymph: No excessive bleeding, bruising, enlarged lymph nodes.  Physical Exam  BP 129/79   Pulse 91   Temp 98.4 F (36.9 C)   Resp 12   Ht 4' 11.5" (1.511 m)   Wt 179 lb (81.2 kg)   SpO2 95%   BMI 35.55 kg/m   General Appearance: Well nourished, well groomed and in no apparent distress.  Eyes: PERRLA, EOMs, conjunctiva no swelling or erythema, normal fundi and vessels. Sinuses: No frontal/maxillary tenderness ENT/Mouth: EACs patent / TMs  nl. Nares clear without erythema, swelling, mucoid exudates. Oral hygiene is good. No erythema, swelling, or exudate. Tongue normal,  non-obstructing. Tonsils not swollen or erythematous. Hearing normal.  Neck: Supple, thyroid not palpable. No bruits, nodes or JVD. Respiratory: Respiratory effort normal.  BS equal and clear bilateral without rales, rhonci, wheezing or stridor. Cardio: Heart sounds are normal with regular rate and rhythm and no murmurs, rubs or gallops. Peripheral pulses are normal and equal bilaterally without edema. BrachioRadial AVF with thrill & bruit.  No aortic or femoral bruits. Chest: symmetric with normal excursions and percussion. Breasts: Symmetric, without lumps, nipple discharge, retractions, or fibrocystic changes.  Abdomen: Flat, soft with bowel sounds active. Nontender, no guarding, rebound, hernias, masses, or organomegaly.  Lymphatics: Non tender without lymphadenopathy. Musculoskeletal: Full ROM all peripheral extremities, joint stability, 5/5 strength, and normal gait. Skin: Warm and dry without rashes, lesions, cyanosis, clubbing or  ecchymosis.  Neuro: Cranial nerves intact, reflexes equal bilaterally. Normal muscle tone, no cerebellar symptoms. Sensation intact.  Pysch: Alert and oriented X 3, normal affect, Insight and Judgment appropriate.   Assessment and Plan  1. Essential hypertension  - EKG 12-Lead - CBC with Diff - COMPLETE METABOLIC PANEL WITH GFR - Magnesium - TSH  2. Hyperlipidemia associated with type 2 diabetes mellitus ([place])  - EKG 12-Lead - Lipid Profile - TSH  3. Type 2 diabetes mellitus with chronic kidney disease on  chronic dialysis, with long-term current use of insulin (HCC)  - EKG 12-Lead - HM DIABETES FOOT EXAM - LOW EXTREMITY NEUR EXAM DOCUM - PTH, intact and calcium - Insulin, random - Hemoglobin A1c ([facility])  4. Vitamin D deficiency  - Vitamin D (25 hydroxy)  5. Chronic diastolic heart failure (HCC)  - EKG 12-Lead  6. Paroxysmal atrial fibrillation (HCC)  - EKG 12-Lead - TSH  7. ESRD on dialysis ([place])   8. Anemia in chronic  kidney disease, on chronic dialysis ([place])   9. Screening for colorectal cancer  - POC Hemoccult Bld/Stl (3-Cd Home Screen); Future  10. Screening for ischemic heart disease  - EKG 12-Lead  11. FHx: heart disease  - EKG 12-Lead  12. Medication management  - Uric acid - CBC with Diff - COMPLETE METABOLIC PANEL WITH GFR - Magnesium - Lipid Profile - TSH - Insulin, random - Vitamin D (25 hydroxy) - Hemoglobin A1c ([facility])  Patient was counseled in prudent diet to achieve/maintain BMI less than 25 for weight control, BP monitoring, regular exercise and medications. Discussed med's effects and SE's. Screening labs and tests as requested with regular follow-up as recommended. Over 40 minutes of exam, counseling, chart review and high complex critical decision making was performed.   Kirtland Bouchard, MD

## 2019-10-30 NOTE — Patient Instructions (Signed)

## 2019-10-31 LAB — CBC WITH DIFFERENTIAL/PLATELET
Absolute Monocytes: 541 cells/uL (ref 200–950)
Basophils Absolute: 62 cells/uL (ref 0–200)
Basophils Relative: 1.4 %
Eosinophils Absolute: 180 cells/uL (ref 15–500)
Eosinophils Relative: 4.1 %
HCT: 34.6 % — ABNORMAL LOW (ref 35.0–45.0)
Hemoglobin: 11.1 g/dL — ABNORMAL LOW (ref 11.7–15.5)
Lymphs Abs: 1104 cells/uL (ref 850–3900)
MCH: 27.3 pg (ref 27.0–33.0)
MCHC: 32.1 g/dL (ref 32.0–36.0)
MCV: 85.2 fL (ref 80.0–100.0)
MPV: 10.9 fL (ref 7.5–12.5)
Monocytes Relative: 12.3 %
Neutro Abs: 2512 cells/uL (ref 1500–7800)
Neutrophils Relative %: 57.1 %
Platelets: 246 10*3/uL (ref 140–400)
RBC: 4.06 10*6/uL (ref 3.80–5.10)
RDW: 16.7 % — ABNORMAL HIGH (ref 11.0–15.0)
Total Lymphocyte: 25.1 %
WBC: 4.4 10*3/uL (ref 3.8–10.8)

## 2019-10-31 LAB — COMPLETE METABOLIC PANEL WITH GFR
AG Ratio: 1.2 (calc) (ref 1.0–2.5)
ALT: 5 U/L — ABNORMAL LOW (ref 6–29)
AST: 10 U/L (ref 10–35)
Albumin: 4.4 g/dL (ref 3.6–5.1)
Alkaline phosphatase (APISO): 55 U/L (ref 37–153)
BUN/Creatinine Ratio: 4 (calc) — ABNORMAL LOW (ref 6–22)
BUN: 29 mg/dL — ABNORMAL HIGH (ref 7–25)
CO2: 34 mmol/L — ABNORMAL HIGH (ref 20–32)
Calcium: 10.1 mg/dL (ref 8.6–10.4)
Chloride: 92 mmol/L — ABNORMAL LOW (ref 98–110)
Creat: 6.81 mg/dL — ABNORMAL HIGH (ref 0.50–0.99)
GFR, Est African American: 7 mL/min/{1.73_m2} — ABNORMAL LOW (ref >=60)
GFR, Est Non African American: 6 mL/min/{1.73_m2} — ABNORMAL LOW (ref >=60)
Globulin: 3.7 g/dL (calc) (ref 1.9–3.7)
Glucose, Bld: 124 mg/dL — ABNORMAL HIGH (ref 65–99)
Potassium: 3.4 mmol/L — ABNORMAL LOW (ref 3.5–5.3)
Sodium: 140 mmol/L (ref 135–146)
Total Bilirubin: 0.5 mg/dL (ref 0.2–1.2)
Total Protein: 8.1 g/dL (ref 6.1–8.1)

## 2019-10-31 LAB — HEMOGLOBIN A1C
Hgb A1c MFr Bld: 6.2 % of total Hgb — ABNORMAL HIGH (ref <5.7)
Mean Plasma Glucose: 131 (calc)
eAG (mmol/L): 7.3 (calc)

## 2019-10-31 LAB — VITAMIN D 25 HYDROXY (VIT D DEFICIENCY, FRACTURES): Vit D, 25-Hydroxy: 36 ng/mL (ref 30–100)

## 2019-10-31 LAB — MAGNESIUM: Magnesium: 2.2 mg/dL (ref 1.5–2.5)

## 2019-10-31 LAB — LIPID PANEL
Cholesterol: 168 mg/dL (ref <200)
HDL: 43 mg/dL — ABNORMAL LOW (ref >=50)
LDL Cholesterol (Calc): 95 mg/dL (calc)
Non-HDL Cholesterol (Calc): 125 mg/dL (calc) (ref <130)
Total CHOL/HDL Ratio: 3.9 (calc) (ref <5.0)
Triglycerides: 200 mg/dL — ABNORMAL HIGH (ref <150)

## 2019-10-31 LAB — PTH, INTACT AND CALCIUM
Calcium: 10.1 mg/dL (ref 8.6–10.4)
PTH: 106 pg/mL — ABNORMAL HIGH (ref 14–64)

## 2019-10-31 LAB — INSULIN, RANDOM: Insulin: 29.9 u[IU]/mL — ABNORMAL HIGH

## 2019-10-31 LAB — URIC ACID: Uric Acid, Serum: 4.4 mg/dL (ref 2.5–7.0)

## 2019-10-31 LAB — TSH: TSH: 3.26 mIU/L (ref 0.40–4.50)

## 2019-11-02 ENCOUNTER — Encounter: Payer: Self-pay | Admitting: Internal Medicine

## 2019-11-17 NOTE — Progress Notes (Signed)
HPI: Follow-up hypertension, diastolic heart failure and previous mitral valve repair. Renal dopplers Nov 2013 showed no RAS. Note previous pericardial effusion with minoxidil. Transesophageal echocardiogram July 2016 showed normal LV function, moderate to severe left atrial enlargement, small pericardial effusion, flail segment of anterior mitral valve leaflet with severe mitral regurgitation and mild tricuspid regurgitation. Cardiac catheterization September 2016 showed nonobstructive coronary disease and severely elevated pulmonary pressures. Carotid Dopplers August 2016 showed 1-39% bilateral stenosis. Patient had mitral valve repair in August 2016. Patient's course was complicated by acute hypoxic respiratory failure refractory to conventional ventilatory support. She was transferred to [facility] for ECMO. Course was complicated by need for tracheostomy. She also developed acute on chronic renal failure and now requires dialysis. Nuclear study January 2020 showed ejection fraction 57% and no scar or ischemia.  Echocardiogram February 2020 at [facility] showed normal LV function, prior mitral valve repair with mild mitral regurgitation and mean gradient 5 mmHg, mild to moderate left atrial enlargement, mild right atrial enlargement, moderate tricuspid regurgitation.  Right heart catheterization at [facility] November 2020 showed RA pressure of 9, RV pressure 55/8, PA pressure 55/25 and pulmonary capillary wedge pressure of 18.  Followed at [facility] for pulmonary hypertension as she was being considered for renal transplant.  Since last seen,  she denies dyspnea, chest pain, palpitations or syncope.  No pedal edema.  Her blood pressure has been running high.  Current Outpatient Medications  Medication Sig Dispense Refill  . acetaminophen (TYLENOL) 500 MG tablet Take 1,000 mg by mouth every 6 (six) hours as needed (for pain.).    Marland Kitchen aspirin EC 81 MG tablet Take 81 mg by mouth daily.    Marland Kitchen azelastine  (ASTELIN) 0.1 % nasal spray Use 1 to 2 sprays each nostril 2 to 3 x / day 90 mL 3  . busPIRone (BUSPAR) 10 MG tablet TAKE 1 TABLET BY MOUTH TWICE DAILY (Patient taking differently: Take 10 mg by mouth 2 (two) times daily. ) 180 tablet 3  . carvedilol (COREG) 6.25 MG tablet Take 1 tablet (6.25 mg total) by mouth 2 (two) times daily. 180 tablet 1  . cinacalcet (SENSIPAR) 60 MG tablet Take 1 tablet (60 mg total) by mouth daily. 90 tablet 1  . insulin aspart protamine - aspart (NOVOLOG MIX 70/30 FLEXPEN) (70-30) 100 UNIT/ML FlexPen Inject 0.1 mLs (10 Units total) into the skin 2 (two) times daily. (Patient taking differently: Inject 10 Units into the skin 2 (two) times daily as needed (if blood sugar is above 180). ) 15 mL 3  . latanoprost (XALATAN) 0.005 % ophthalmic solution Place 1 drop into both eyes daily.    Marland Kitchen losartan (COZAAR) 50 MG tablet Take 1 tablet Daily for BP 90 tablet 1  . Multiple Vitamins-Minerals (ZINC PO) Take by mouth.    . ondansetron (ZOFRAN ODT) 4 MG disintegrating tablet Take 1 tablet (4 mg total) by mouth every 8 (eight) hours as needed for nausea or vomiting. 10 tablet 0  . RENVELA 800 MG tablet Take 1,600-4,000 mg by mouth 3 (three) times daily. 4000 mg (5 caps) with meals and 1600 mg (2 caps) with snacks  5  . rosuvastatin (CRESTOR) 20 MG tablet TAKE 1 TABLET BY MOUTH ONCE DAILY (Patient taking differently: Take 20 mg by mouth daily. ) 90 tablet 3  . sertraline (ZOLOFT) 25 MG tablet Take 1 tablet Daily for Mood 90 tablet 3   No current facility-administered medications for this visit.  Past Medical History:  Diagnosis Date  . Anemia   . Chronic diastolic CHF (congestive heart failure) ([place])   . Chronic diastolic heart failure ([place])   . CKD (chronic kidney disease) stage 4, GFR 15-29 ml/min (HCC)    dialysis M/W/F  . Constipation   . CVA (cerebral infarction) 1997   no residual deficit  . Diverticulitis   . DM (diabetes mellitus) ([place])    type 2  . Heart murmur    . History of cardiovascular stress test    a. Myoview Oct 2012 showed EF 49%, no ischemia, LVE  . HLD (hyperlipidemia)    takes Crestor daily  . Hypertension   . Pericardial effusion    chronic; felt to be poss related to minoxidil >> DC'd  . Pulmonary hypertension ([place]) 06/18/2015  . Respiratory failure, acute hypoxic, post-operative 07/08/2015   Requiring ECMO support  . S/P cardiac catheterization    a. R/L HC 06/18/15:  mLAD 30%; severe pulmo HTN with PA sat 43%, CI 1.86, prominent V waves indicative of MR; resting hypoxemia O2 sat 86% on RA  . S/P minimally invasive mitral valve repair 07/08/2015   Complex valvuloplasty including artificial Gore-tex neochord placement x10 and 26 mm Sorin Memo 3D Rechord ring annuloplasty via right mini thoracotomy approach  . Severe mitral regurgitation   . Shortness of breath dyspnea   . Stroke ([place]) 1997   no residual effect  . Vitamin D deficiency   . Wears glasses     Past Surgical History:  Procedure Laterality Date  . A/V FISTULAGRAM Right 08/16/2017   Procedure: A/V Fistulagram;  Surgeon: Conrad Klickitat, MD;  Location: [place] [facility];  Service: Cardiovascular;  Laterality: Right;  . AV FISTULA PLACEMENT Left 10/22/2015   Procedure: RADIOCEPHALIC ARTERIOVENOUS (AV) FISTULA CREATION;  Surgeon: Serafina Mitchell, MD;  Location: [place];  Service: Vascular;  Laterality: Left;  . AV FISTULA PLACEMENT Right 05/22/2017   Procedure: ARTERIOVENOUS (AV) FISTULA CREATION-RIGHT ARM;  Surgeon: Waynetta Sandy, MD;  Location: [place];  Service: Vascular;  Laterality: Right;  . CANNULATION FOR CARDIOPULMONARY BYPASS N/A 07/08/2015   Procedure: CANNULATION FOR ECMO;  Surgeon: Rexene Alberts, MD;  Location: [place];  Service: Open Heart Surgery;  Laterality: N/A;  . CARDIAC CATHETERIZATION    . CARDIAC CATHETERIZATION N/A 06/18/2015   Procedure: Right/Left Heart Cath and Coronary Angiography;  Surgeon: Jettie Booze, MD;  Location: [place] [facility];  Service: Cardiovascular;  Laterality: N/A;  . CESAREAN SECTION     x 2  . COLONOSCOPY    . EXCHANGE OF A DIALYSIS CATHETER N/A 06/28/2017   Procedure: EXCHANGE OF A DIALYSIS CATHETER;  Surgeon: Waynetta Sandy, MD;  Location: [place];  Service: Vascular;  Laterality: N/A;  . FISTULA SUPERFICIALIZATION Left 12/28/2015   Procedure: SUPERFICIALIZATION LEFT RADIOCEPHALIC FISTULA;  Surgeon: Serafina Mitchell, MD;  Location: [place];  Service: Vascular;  Laterality: Left;  . FISTULA SUPERFICIALIZATION Right 08/21/2017   Procedure: FISTULA SUPERFICIALIZATION RIGHT ARM;  Surgeon: Waynetta Sandy, MD;  Location: [place];  Service: Vascular;  Laterality: Right;  . INSERTION OF DIALYSIS CATHETER Right 04/28/2017   Procedure: INSERTION OF DIALYSIS CATHETER-RIGHT INTERNAL JUGULAR PLACEMENT;  Surgeon: Elam Dutch, MD;  Location: [place];  Service: Vascular;  Laterality: Right;  . LIGATION OF ARTERIOVENOUS  FISTULA Left 04/28/2017   Procedure: LIGATION OF ARTERIOVENOUS  FISTULA;  Surgeon: Elam Dutch, MD;  Location: [place];  Service: Vascular;  Laterality:  Left;  . MITRAL VALVE REPAIR Right 07/08/2015   Procedure: MINIMALLY INVASIVE MITRAL VALVE REPAIR with a 26 Sorin Memo 3D Rechord;  Surgeon: Rexene Alberts, MD;  Location: [place];  Service: Open Heart Surgery;  Laterality: Right;  . MULTIPLE EXTRACTIONS WITH ALVEOLOPLASTY N/A 06/30/2015   Procedure: MULTIPLE EXTRACTIONS OF TOOTH #'S 4  AND 30 WITH ALVEOLOPLASTY AND GROSS DEBRIDEMENT  OF REMAINING TEETH;  Surgeon: Lenn Cal, DDS;  Location: [place];  Service: Oral Surgery;  Laterality: N/A;  . PERIPHERAL VASCULAR CATHETERIZATION Left 03/28/2016   Procedure: A/V Fistulagram;  Surgeon: Serafina Mitchell, MD;  Location: [place] [facility];  Service: Cardiovascular;  Laterality: Left;  lower arm  . TEE WITHOUT CARDIOVERSION N/A 06/08/2015   Procedure: TRANSESOPHAGEAL ECHOCARDIOGRAM (TEE);  Surgeon: Lelon Perla, MD;  Location: [facility];   Service: Cardiovascular;  Laterality: N/A;  . TEE WITHOUT CARDIOVERSION N/A 07/08/2015   Procedure: TRANSESOPHAGEAL ECHOCARDIOGRAM (TEE);  Surgeon: Rexene Alberts, MD;  Location: [place];  Service: Open Heart Surgery;  Laterality: N/A;    Social History   Socioeconomic History  . Marital status: Married    Spouse name: Not on file  . Number of children: 3  . Years of education: Not on file  . Highest education level: Not on file  Occupational History    Comment: Unemployed; used to work as a Radio broadcast assistant  Tobacco Use  . Smoking status: Never Smoker  . Smokeless tobacco: Never Used  Substance and Sexual Activity  . Alcohol use: No    Alcohol/week: 0.0 standard drinks  . Drug use: No  . Sexual activity: Not on file  Other Topics Concern  . Not on file  Social History Narrative  . Not on file   Social Determinants of Health   Financial Resource Strain:   . Difficulty of Paying Living Expenses: Not on file  Food Insecurity:   . Worried About Charity fundraiser in the Last Year: Not on file  . Ran Out of Food in the Last Year: Not on file  Transportation Needs:   . Lack of Transportation (Medical): Not on file  . Lack of Transportation (Non-Medical): Not on file  Physical Activity:   . Days of Exercise per Week: Not on file  . Minutes of Exercise per Session: Not on file  Stress:   . Feeling of Stress : Not on file  Social Connections:   . Frequency of Communication with Friends and Family: Not on file  . Frequency of Social Gatherings with Friends and Family: Not on file  . Attends Religious Services: Not on file  . Active Member of Clubs or Organizations: Not on file  . Attends Archivist Meetings: Not on file  . Marital Status: Not on file  Intimate Partner Violence:   . Fear of Current or Ex-Partner: Not on file  . Emotionally Abused: Not on file  . Physically Abused: Not on file  . Sexually Abused: Not on file    Family History  Problem  Relation Age of Onset  . Cancer Sister        breast cancer  . Stroke Mother   . Heart attack Brother        MI in his 96s  . Heart disease Brother        before age 33  . Breast cancer Sister     ROS: no fevers or chills, productive cough, hemoptysis, dysphasia, odynophagia, melena, hematochezia, dysuria, hematuria,  rash, seizure activity, orthopnea, PND, pedal edema, claudication. Remaining systems are negative.  Physical Exam: Well-developed well-nourished in no acute distress.  Skin is warm and dry.  HEENT is normal.  Neck is supple.  Chest is clear to auscultation with normal expansion.  Cardiovascular exam is regular rate and rhythm.  Abdominal exam nontender or distended. No masses palpated. Extremities show no edema.  AV fistula right upper extremity. neuro grossly intact  A/P  1 prior mitral valve repair-continue SBE prophylaxis.  Most recent echocardiogram showed mild mitral regurgitation and no significant mitral stenosis.  2 pulmonary hypertension-patient is followed at [facility] for this issue.  Felt predominately secondary to pulmonary venous hypertension (WHO group 2).  Volume managed by dialysis.  3 chronic diastolic congestive heart failure-volume management per dialysis.  4 hypertension-blood pressure elevated.  Increase carvedilol to 25 mg twice daily and follow.  Note she did have a first-degree AV block on most recent ECG.  5 hyperlipidemia-continue statin.  6 coronary artery disease-continue aspirin and statin.  7 end-stage renal disease-followed by nephrology.  8 history of cardiomyopathy-LV function normalized on most recent echocardiogram.  [name], MD

## 2019-11-19 ENCOUNTER — Encounter: Payer: Self-pay | Admitting: Internal Medicine

## 2019-11-20 ENCOUNTER — Ambulatory Visit (INDEPENDENT_AMBULATORY_CARE_PROVIDER_SITE_OTHER): Payer: Medicare Other | Admitting: Cardiology

## 2019-11-20 ENCOUNTER — Encounter: Payer: Self-pay | Admitting: Cardiology

## 2019-11-20 ENCOUNTER — Other Ambulatory Visit: Payer: Self-pay

## 2019-11-20 VITALS — BP 154/98 | HR 97 | Temp 97.4°F | Ht 59.0 in | Wt 179.0 lb

## 2019-11-20 DIAGNOSIS — E7800 Pure hypercholesterolemia, unspecified: Secondary | ICD-10-CM | POA: Diagnosis not present

## 2019-11-20 DIAGNOSIS — I2510 Atherosclerotic heart disease of native coronary artery without angina pectoris: Secondary | ICD-10-CM

## 2019-11-20 DIAGNOSIS — I10 Essential (primary) hypertension: Secondary | ICD-10-CM | POA: Diagnosis not present

## 2019-11-20 DIAGNOSIS — Z9889 Other specified postprocedural states: Secondary | ICD-10-CM | POA: Diagnosis not present

## 2019-11-20 MED ORDER — CARVEDILOL 25 MG PO TABS: 25.0000 mg | ORAL_TABLET | Freq: Two times a day (BID) | ORAL | 3 refills | Status: DC

## 2019-11-20 NOTE — Patient Instructions (Signed)
Medication Instructions:  INCREASE CARVEDILOL TO 25 MG TWICE DAILY  *If you need a refill on your cardiac medications before your next appointment, please call your pharmacy*  Lab Work: If you have labs (blood work) drawn today and your tests are completely normal, you will receive your results only by: Marland Kitchen MyChart Message (if you have MyChart) OR . A paper copy in the mail If you have any lab test that is abnormal or we need to change your treatment, we will call you to review the results.  Follow-Up: At [facility], you and your health needs are our priority.  As part of our continuing mission to provide you with exceptional heart care, we have created designated Provider Care Teams.  These Care Teams include your primary Cardiologist (physician) and Advanced Practice Providers (APPs -  Physician Assistants and Nurse Practitioners) who all work together to provide you with the care you need, when you need it.  Your next appointment:   12 month(s)  The format for your next appointment:   Either In Person or Virtual  Provider:   You may see [name] MD or one of the following Advanced Practice Providers on your designated Care Team:    Kerin Ransom, PA-C  [place], [place]  Coletta Memos, [place]

## 2019-11-29 DIAGNOSIS — N2581 Secondary hyperparathyroidism of renal origin: Secondary | ICD-10-CM | POA: Diagnosis not present

## 2019-11-29 DIAGNOSIS — Z992 Dependence on renal dialysis: Secondary | ICD-10-CM | POA: Diagnosis not present

## 2019-11-29 DIAGNOSIS — N186 End stage renal disease: Secondary | ICD-10-CM | POA: Diagnosis not present

## 2019-12-01 DIAGNOSIS — Z992 Dependence on renal dialysis: Secondary | ICD-10-CM | POA: Diagnosis not present

## 2019-12-01 DIAGNOSIS — N2581 Secondary hyperparathyroidism of renal origin: Secondary | ICD-10-CM | POA: Diagnosis not present

## 2019-12-01 DIAGNOSIS — N186 End stage renal disease: Secondary | ICD-10-CM | POA: Diagnosis not present

## 2019-12-03 DIAGNOSIS — N186 End stage renal disease: Secondary | ICD-10-CM | POA: Diagnosis not present

## 2019-12-03 DIAGNOSIS — N2581 Secondary hyperparathyroidism of renal origin: Secondary | ICD-10-CM | POA: Diagnosis not present

## 2019-12-03 DIAGNOSIS — Z992 Dependence on renal dialysis: Secondary | ICD-10-CM | POA: Diagnosis not present

## 2019-12-05 DIAGNOSIS — N186 End stage renal disease: Secondary | ICD-10-CM | POA: Diagnosis not present

## 2019-12-05 DIAGNOSIS — N2581 Secondary hyperparathyroidism of renal origin: Secondary | ICD-10-CM | POA: Diagnosis not present

## 2019-12-05 DIAGNOSIS — Z992 Dependence on renal dialysis: Secondary | ICD-10-CM | POA: Diagnosis not present

## 2019-12-08 DIAGNOSIS — N2581 Secondary hyperparathyroidism of renal origin: Secondary | ICD-10-CM | POA: Diagnosis not present

## 2019-12-08 DIAGNOSIS — N186 End stage renal disease: Secondary | ICD-10-CM | POA: Diagnosis not present

## 2019-12-08 DIAGNOSIS — Z992 Dependence on renal dialysis: Secondary | ICD-10-CM | POA: Diagnosis not present

## 2019-12-10 DIAGNOSIS — N186 End stage renal disease: Secondary | ICD-10-CM | POA: Diagnosis not present

## 2019-12-10 DIAGNOSIS — N2581 Secondary hyperparathyroidism of renal origin: Secondary | ICD-10-CM | POA: Diagnosis not present

## 2019-12-10 DIAGNOSIS — Z992 Dependence on renal dialysis: Secondary | ICD-10-CM | POA: Diagnosis not present

## 2019-12-12 DIAGNOSIS — Z992 Dependence on renal dialysis: Secondary | ICD-10-CM | POA: Diagnosis not present

## 2019-12-12 DIAGNOSIS — N186 End stage renal disease: Secondary | ICD-10-CM | POA: Diagnosis not present

## 2019-12-12 DIAGNOSIS — N2581 Secondary hyperparathyroidism of renal origin: Secondary | ICD-10-CM | POA: Diagnosis not present

## 2019-12-15 DIAGNOSIS — N186 End stage renal disease: Secondary | ICD-10-CM | POA: Diagnosis not present

## 2019-12-15 DIAGNOSIS — Z992 Dependence on renal dialysis: Secondary | ICD-10-CM | POA: Diagnosis not present

## 2019-12-15 DIAGNOSIS — N2581 Secondary hyperparathyroidism of renal origin: Secondary | ICD-10-CM | POA: Diagnosis not present

## 2019-12-17 DIAGNOSIS — N186 End stage renal disease: Secondary | ICD-10-CM | POA: Diagnosis not present

## 2019-12-17 DIAGNOSIS — Z992 Dependence on renal dialysis: Secondary | ICD-10-CM | POA: Diagnosis not present

## 2019-12-17 DIAGNOSIS — N2581 Secondary hyperparathyroidism of renal origin: Secondary | ICD-10-CM | POA: Diagnosis not present

## 2019-12-19 DIAGNOSIS — N2581 Secondary hyperparathyroidism of renal origin: Secondary | ICD-10-CM | POA: Diagnosis not present

## 2019-12-19 DIAGNOSIS — N186 End stage renal disease: Secondary | ICD-10-CM | POA: Diagnosis not present

## 2019-12-19 DIAGNOSIS — Z992 Dependence on renal dialysis: Secondary | ICD-10-CM | POA: Diagnosis not present

## 2019-12-22 DIAGNOSIS — N2581 Secondary hyperparathyroidism of renal origin: Secondary | ICD-10-CM | POA: Diagnosis not present

## 2019-12-22 DIAGNOSIS — N186 End stage renal disease: Secondary | ICD-10-CM | POA: Diagnosis not present

## 2019-12-22 DIAGNOSIS — Z992 Dependence on renal dialysis: Secondary | ICD-10-CM | POA: Diagnosis not present

## 2019-12-23 DIAGNOSIS — N186 End stage renal disease: Secondary | ICD-10-CM | POA: Diagnosis not present

## 2019-12-23 DIAGNOSIS — I771 Stricture of artery: Secondary | ICD-10-CM | POA: Diagnosis not present

## 2019-12-23 DIAGNOSIS — Z992 Dependence on renal dialysis: Secondary | ICD-10-CM | POA: Diagnosis not present

## 2019-12-24 DIAGNOSIS — Z992 Dependence on renal dialysis: Secondary | ICD-10-CM | POA: Diagnosis not present

## 2019-12-24 DIAGNOSIS — N186 End stage renal disease: Secondary | ICD-10-CM | POA: Diagnosis not present

## 2019-12-24 DIAGNOSIS — N2581 Secondary hyperparathyroidism of renal origin: Secondary | ICD-10-CM | POA: Diagnosis not present

## 2019-12-26 DIAGNOSIS — Z992 Dependence on renal dialysis: Secondary | ICD-10-CM | POA: Diagnosis not present

## 2019-12-26 DIAGNOSIS — N2581 Secondary hyperparathyroidism of renal origin: Secondary | ICD-10-CM | POA: Diagnosis not present

## 2019-12-26 DIAGNOSIS — N186 End stage renal disease: Secondary | ICD-10-CM | POA: Diagnosis not present

## 2019-12-28 DIAGNOSIS — E1129 Type 2 diabetes mellitus with other diabetic kidney complication: Secondary | ICD-10-CM | POA: Diagnosis not present

## 2019-12-28 DIAGNOSIS — N186 End stage renal disease: Secondary | ICD-10-CM | POA: Diagnosis not present

## 2019-12-28 DIAGNOSIS — Z992 Dependence on renal dialysis: Secondary | ICD-10-CM | POA: Diagnosis not present

## 2019-12-29 DIAGNOSIS — N2581 Secondary hyperparathyroidism of renal origin: Secondary | ICD-10-CM | POA: Diagnosis not present

## 2019-12-29 DIAGNOSIS — N186 End stage renal disease: Secondary | ICD-10-CM | POA: Diagnosis not present

## 2019-12-29 DIAGNOSIS — Z992 Dependence on renal dialysis: Secondary | ICD-10-CM | POA: Diagnosis not present

## 2019-12-31 DIAGNOSIS — N2581 Secondary hyperparathyroidism of renal origin: Secondary | ICD-10-CM | POA: Diagnosis not present

## 2019-12-31 DIAGNOSIS — Z992 Dependence on renal dialysis: Secondary | ICD-10-CM | POA: Diagnosis not present

## 2019-12-31 DIAGNOSIS — N186 End stage renal disease: Secondary | ICD-10-CM | POA: Diagnosis not present

## 2020-01-02 DIAGNOSIS — Z992 Dependence on renal dialysis: Secondary | ICD-10-CM | POA: Diagnosis not present

## 2020-01-02 DIAGNOSIS — N2581 Secondary hyperparathyroidism of renal origin: Secondary | ICD-10-CM | POA: Diagnosis not present

## 2020-01-02 DIAGNOSIS — N186 End stage renal disease: Secondary | ICD-10-CM | POA: Diagnosis not present

## 2020-01-05 DIAGNOSIS — N2581 Secondary hyperparathyroidism of renal origin: Secondary | ICD-10-CM | POA: Diagnosis not present

## 2020-01-05 DIAGNOSIS — N186 End stage renal disease: Secondary | ICD-10-CM | POA: Diagnosis not present

## 2020-01-05 DIAGNOSIS — Z992 Dependence on renal dialysis: Secondary | ICD-10-CM | POA: Diagnosis not present

## 2020-01-07 DIAGNOSIS — N2581 Secondary hyperparathyroidism of renal origin: Secondary | ICD-10-CM | POA: Diagnosis not present

## 2020-01-07 DIAGNOSIS — Z992 Dependence on renal dialysis: Secondary | ICD-10-CM | POA: Diagnosis not present

## 2020-01-07 DIAGNOSIS — N186 End stage renal disease: Secondary | ICD-10-CM | POA: Diagnosis not present

## 2020-01-09 DIAGNOSIS — Z992 Dependence on renal dialysis: Secondary | ICD-10-CM | POA: Diagnosis not present

## 2020-01-09 DIAGNOSIS — N186 End stage renal disease: Secondary | ICD-10-CM | POA: Diagnosis not present

## 2020-01-09 DIAGNOSIS — N2581 Secondary hyperparathyroidism of renal origin: Secondary | ICD-10-CM | POA: Diagnosis not present

## 2020-01-12 DIAGNOSIS — N2581 Secondary hyperparathyroidism of renal origin: Secondary | ICD-10-CM | POA: Diagnosis not present

## 2020-01-12 DIAGNOSIS — N186 End stage renal disease: Secondary | ICD-10-CM | POA: Diagnosis not present

## 2020-01-12 DIAGNOSIS — Z992 Dependence on renal dialysis: Secondary | ICD-10-CM | POA: Diagnosis not present

## 2020-01-14 DIAGNOSIS — Z992 Dependence on renal dialysis: Secondary | ICD-10-CM | POA: Diagnosis not present

## 2020-01-14 DIAGNOSIS — N186 End stage renal disease: Secondary | ICD-10-CM | POA: Diagnosis not present

## 2020-01-14 DIAGNOSIS — N2581 Secondary hyperparathyroidism of renal origin: Secondary | ICD-10-CM | POA: Diagnosis not present

## 2020-01-16 DIAGNOSIS — Z992 Dependence on renal dialysis: Secondary | ICD-10-CM | POA: Diagnosis not present

## 2020-01-16 DIAGNOSIS — N186 End stage renal disease: Secondary | ICD-10-CM | POA: Diagnosis not present

## 2020-01-16 DIAGNOSIS — N2581 Secondary hyperparathyroidism of renal origin: Secondary | ICD-10-CM | POA: Diagnosis not present

## 2020-01-19 DIAGNOSIS — N186 End stage renal disease: Secondary | ICD-10-CM | POA: Diagnosis not present

## 2020-01-19 DIAGNOSIS — N2581 Secondary hyperparathyroidism of renal origin: Secondary | ICD-10-CM | POA: Diagnosis not present

## 2020-01-19 DIAGNOSIS — Z992 Dependence on renal dialysis: Secondary | ICD-10-CM | POA: Diagnosis not present

## 2020-01-21 DIAGNOSIS — Z992 Dependence on renal dialysis: Secondary | ICD-10-CM | POA: Diagnosis not present

## 2020-01-21 DIAGNOSIS — N186 End stage renal disease: Secondary | ICD-10-CM | POA: Diagnosis not present

## 2020-01-21 DIAGNOSIS — N2581 Secondary hyperparathyroidism of renal origin: Secondary | ICD-10-CM | POA: Diagnosis not present

## 2020-01-23 DIAGNOSIS — N186 End stage renal disease: Secondary | ICD-10-CM | POA: Diagnosis not present

## 2020-01-23 DIAGNOSIS — Z992 Dependence on renal dialysis: Secondary | ICD-10-CM | POA: Diagnosis not present

## 2020-01-23 DIAGNOSIS — N2581 Secondary hyperparathyroidism of renal origin: Secondary | ICD-10-CM | POA: Diagnosis not present

## 2020-01-25 DIAGNOSIS — Z992 Dependence on renal dialysis: Secondary | ICD-10-CM | POA: Diagnosis not present

## 2020-01-25 DIAGNOSIS — N186 End stage renal disease: Secondary | ICD-10-CM | POA: Diagnosis not present

## 2020-01-25 DIAGNOSIS — E1129 Type 2 diabetes mellitus with other diabetic kidney complication: Secondary | ICD-10-CM | POA: Diagnosis not present

## 2020-01-26 DIAGNOSIS — N2581 Secondary hyperparathyroidism of renal origin: Secondary | ICD-10-CM | POA: Diagnosis not present

## 2020-01-26 DIAGNOSIS — Z992 Dependence on renal dialysis: Secondary | ICD-10-CM | POA: Diagnosis not present

## 2020-01-26 DIAGNOSIS — N186 End stage renal disease: Secondary | ICD-10-CM | POA: Diagnosis not present

## 2020-01-28 DIAGNOSIS — N186 End stage renal disease: Secondary | ICD-10-CM | POA: Diagnosis not present

## 2020-01-28 DIAGNOSIS — N2581 Secondary hyperparathyroidism of renal origin: Secondary | ICD-10-CM | POA: Diagnosis not present

## 2020-01-28 DIAGNOSIS — Z992 Dependence on renal dialysis: Secondary | ICD-10-CM | POA: Diagnosis not present

## 2020-01-28 NOTE — Progress Notes (Signed)
ANNUAL MEDICARE VISIT AND FOLLOW UP  Assessment:   [name] was seen today for medicare wellness and follow-up.  Diagnoses and all orders for this visit:  Medicare annual wellness visit  Essential hypertension At goal; no problems at dialysis Monitor blood pressure at home; call if consistently over 130/80 Continue DASH diet.   Reminder to go to the ER if any CP, SOB, nausea, dizziness, severe HA, changes vision/speech, left arm numbness and tingling and jaw pain  Chronic diastolic heart failure (HCC)       Followed by cardiology; edema resolved since dialysis  Insulin dependent diabetes with renal manifestation (HCC) Insulin 70/30 PRN hyperglycemia 180<; has not needed in past 12+ months Reminded eye Exam yearly (has done for 2019, report requested from Dr. Katy Fitch) and Dental Exam every 6 months. Dietary recommendations reviewed  Physical Activity recommendations reviewed -     Hemoglobin A1c  ESRD on dialysis [facility]) Doing well with dialysis after extensive fistula access issues; MWF; followed by Dr. Joelyn Oms [facility] for renal transplant via [facility] -     CMP/GFR  AV fistula ([place]) Followed by nephrology, vascular PRN  Anemia in chronic kidney disease, on chronic dialysis ([place]) Followed by nephrology -     CBC with Differential/Platelet  Hyperlipidemia associated with T2DM ([place]) Continue medications: crestor 20 mg daily LDL goal <70 Continue low cholesterol diet and exercise.  -     Lipid panel -     TSH  Vitamin D deficiency Followed by nephrology; continue supplementation -     VITAMIN D 25 Hydroxy (Vit-D Deficiency, Fractures)  Medication management -     CBC with Differential/Platelet -     CMP/GFR  Morbid obesity (BMI 36)  Commended on weight loss progress Long discussion about weight loss, diet, and exercise Recommended diet heavy in fruits and veggies and low in animal meats, cheeses, and dairy products, appropriate calorie intake Discussed appropriate weight  for height, initial goal weight of 175lb discussed Follow up 3 months  Pulmonary hypertension ([place]) Monitoring, symptoms improved from previous, stable Pulmonology referral as indicated  Cardiomyopathy, unspecified type ([place]) Followed by cardiology, denies concerning sx, fluids managed by dialysis  Paroxysmal atrial fibrillation ([place]) No longer on anticoagulation; continue follow up with cardiology  Chronic periodontitis Reminded to schedule follow up with dental  S/P minimally invasive mitral valve repair Followed by cardiology  Depression in remission ([place]) In remission on medicatoins; Continue zoloft, buspar  Lifestyle discussed: diet/exerise, sleep hygiene, stress management, hydration  Anemia in chronic kidney disease, on dialysis ([place]) Monitor CBC, managed by nephrology   Secondary renal hyperparathyroidism ([place]) Monitor; managed by nephrology   Over 40 minutes of exam, counseling, chart review and critical decision making was performed Future Appointments  Date Time Provider [place]  05/06/2020  2:30 PM Unk Pinto, MD [facility] None  11/23/2020  2:00 PM Unk Pinto, MD [facility] None     Plan:   During the course of the visit the patient was educated and counseled about appropriate screening and preventive services including:    Pneumococcal vaccine   Prevnar 13  Influenza vaccine  Td vaccine  Screening electrocardiogram  Bone densitometry screening  Colorectal cancer screening  Diabetes screening  Glaucoma screening  Nutrition counseling   Advanced directives: requested   Subjective:  Jody Nguyen is a 64 y.o. female who presents for Medicare Annual Wellness Visit and 3 month follow up.   In 2016, she underwent an elective AoVRw/Anesthesia complications with consequent multi systems failure then transferred  to [facility] for ECMO  & Dialysis. She has been on Dialysis treatments 3 x/week, followed by Dr. Joelyn Oms. She is  hopeful for a Kidney transplant. She has follow up next month with Jody Nguyen, trying to lose a bit more weight before then.   She reports she is doing very well recently. She is followed by cardiology with extensive hx including cath, bypass and minimally invasive mitral valve repair in 2016.   Echocardiogram February 2020 at [facility] showed normal LV function, prior mitral valve repair with mild mitral regurgitation and mean gradient 5 mmHg, mild to moderate left atrial enlargement, mild right atrial enlargement, moderate tricuspid regurgitation.  Right heart catheterization at [facility] November 2020 showed RA pressure of 9, RV pressure 55/8, PA pressure 55/25 and pulmonary capillary wedge pressure of 18.  Followed at [facility] for pulmonary hypertension as she was being considered for renal transplant.  Reports she is doing well in this past year, denies CP, dyspnea, palpitations, orthopnea, LE edema.   she has a diagnosis of depression in remission on medication and currently remains on zoloft 25 mg, buspar 10 mg BID, reports symptoms are well controlled on current regimen.   BMI is Body mass index is 36.68 kg/m., she has been working on diet and exercise, walking 30 min 3 days a week on non-dialysis days, has been cutting out meat, focusing on vegetables, beans, nuts.  Wt Readings from Last 3 Encounters:  01/29/20 181 lb 9.6 oz (82.4 kg)  11/20/19 179 lb (81.2 kg)  10/30/19 179 lb (81.2 kg)    Her blood pressure has been controlled at home, today their BP is BP: 126/66 She does workout. She denies chest pain, shortness of breath, dizziness.   She is on cholesterol medication (rosuvastatin 20 mg daily) and denies myalgias. Her cholesterol is at goal. The cholesterol last visit was:   Lab Results  Component Value Date   CHOL 168 10/30/2019   HDL 43 (L) 10/30/2019   LDLCALC 95 10/30/2019   TRIG 200 (H) 10/30/2019   CHOLHDL 3.9 10/30/2019    She has been working on diet and exercise for T2  diabetes, insulin PRN, on daily ASA, statin, not on ACE/ARB secondary to ESRD on dialysis, and denies foot ulcerations, hyperglycemia, hypoglycemia , increased appetite, nausea, paresthesia of the feet, polydipsia, polyuria, visual disturbances and vomiting. She takes insulin only as needed for glucose readings above 180. Has not needed in past 3 months. She reports fasting glucose typically 115-120. Last A1C in the office was:  Lab Results  Component Value Date   HGBA1C 6.2 (H) 10/30/2019   She has ESRD on dialysis, gets on MWF. Follows with [name]. Last GFR: Lab Results  Component Value Date   GFRAA 7 (L) 10/30/2019   Patient is on Vitamin D supplement, taking what is administered to on dialysis days by nephrology  Lab Results  Component Value Date   VD25OH 36 10/30/2019      Medication Review: Current Outpatient Medications on File Prior to Visit  Medication Sig Dispense Refill  . acetaminophen (TYLENOL) 500 MG tablet Take 1,000 mg by mouth every 6 (six) hours as needed (for pain.).    Marland Kitchen aspirin EC 81 MG tablet Take 81 mg by mouth daily.    Marland Kitchen azelastine (ASTELIN) 0.1 % nasal spray Use 1 to 2 sprays each nostril 2 to 3 x / day 90 mL 3  . busPIRone (BUSPAR) 10 MG tablet TAKE 1 TABLET BY MOUTH TWICE DAILY (Patient taking differently:  Take 10 mg by mouth 2 (two) times daily. ) 180 tablet 3  . carvedilol (COREG) 25 MG tablet Take 1 tablet (25 mg total) by mouth 2 (two) times daily. 180 tablet 3  . cinacalcet (SENSIPAR) 60 MG tablet Take 1 tablet (60 mg total) by mouth daily. 90 tablet 1  . hydrALAZINE (APRESOLINE) 25 MG tablet Take 25 mg by mouth 3 (three) times daily.    . insulin aspart protamine - aspart (NOVOLOG MIX 70/30 FLEXPEN) (70-30) 100 UNIT/ML FlexPen Inject 0.1 mLs (10 Units total) into the skin 2 (two) times daily. (Patient taking differently: Inject 10 Units into the skin 2 (two) times daily as needed (if blood sugar is above 180). ) 15 mL 3  . latanoprost (XALATAN) 0.005 %  ophthalmic solution Place 1 drop into both eyes daily.    Marland Kitchen losartan (COZAAR) 50 MG tablet Take 1 tablet Daily for BP 90 tablet 1  . Multiple Vitamins-Minerals (ZINC PO) Take by mouth.    . ondansetron (ZOFRAN ODT) 4 MG disintegrating tablet Take 1 tablet (4 mg total) by mouth every 8 (eight) hours as needed for nausea or vomiting. 10 tablet 0  . RENVELA 800 MG tablet Take 1,600-4,000 mg by mouth 3 (three) times daily. 4000 mg (5 caps) with meals and 1600 mg (2 caps) with snacks  5  . rosuvastatin (CRESTOR) 20 MG tablet TAKE 1 TABLET BY MOUTH ONCE DAILY (Patient taking differently: Take 20 mg by mouth daily. ) 90 tablet 3  . sertraline (ZOLOFT) 25 MG tablet Take 1 tablet Daily for Mood 90 tablet 3   No current facility-administered medications on file prior to visit.    Allergies  Allergen Reactions  . Minoxidil Other (See Comments)    Pericardial effusion  . Lipitor [Atorvastatin] Other (See Comments)    MYALGIAS > "pain in legs" Tolerates rosuvastatin  . Morphine And Related Itching  . Ace Inhibitors Other (See Comments)    REACTION: "not sure...think it made me drowsy all the time"    Current Problems (verified) Patient Active Problem List   Diagnosis Date Noted  . A-V fistula ([place]) 01/29/2020  . Secondary renal hyperparathyroidism ([place]) 07/28/2019  . Major depression in remission ([place]) 01/22/2019  . ESRD on dialysis ([place]) 10/09/2015  . Cardiomyopathy ([place]) 09/10/2015  . Atrial fibrillation ([place]) 09/10/2015  . S/P minimally invasive mitral valve repair 07/08/2015  . Chronic periodontitis 06/30/2015  . Chronic diastolic heart failure ([place])   . Pulmonary hypertension ([place]) 06/18/2015  . Morbid obesity (BMI 36.05)  03/29/2015  . Type 2 diabetes mellitus ([place]) 11/22/2014  . Vitamin D deficiency 03/25/2014  . Anemia in chronic kidney disease   . Essential hypertension 08/17/2011  . Hyperlipidemia associated with type 2 diabetes mellitus ([place]) 08/17/2011    Screening  Tests Immunization History  Administered Date(s) Administered  . Hepatitis B, adult 10/25/2015, 11/24/2015, 12/27/2015  . Influenza Split 08/20/2012, 08/15/2017  . Influenza, Quadrivalent, Recombinant, Inj, Pf 08/25/2019  . PPD Test 03/25/2014, 03/29/2015  . Pneumococcal Conjugate-13 03/19/2019  . Pneumococcal Polysaccharide-23 09/18/2016, 09/11/2017  . Pneumococcal-Unspecified 07/28/2008  . Td 01/23/2019  . Tdap 12/28/2008    Preventative Nguyen: Last colonoscopy: 2013  Last mammogram: 08/2019 Last pap smear/pelvic exam: 10/2018 neg HPV  DEXA: 2010 normal - not a candidate for bisphophonates - defer  Prior vaccinations: TD or Tdap: 2020   Influenza: 07/2019  Pneumococcal: 2018 Prevnar13: 2009 Shingles/Zostavax: zostavax, can't remember when, less than 4 years ago covid 19 - will be getting at  dialysis center   Names of Other Physician/Practitioners you currently use: 1. [facility] here for primary Nguyen 2. Dr. Katy Fitch, eye doctor, last visit 08/26/2019, report verified and abstracted 3. Dr. Orene Desanctis, dentist, last visit 2016, had scheduled 02/17/2019 but was cancelled   Patient Nguyen Team: Unk Pinto, MD as PCP - General (Internal Medicine) Rexene Agent, MD as Attending Physician (Nephrology)  SURGICAL HISTORY She  has a past surgical history that includes Cesarean section; TEE without cardioversion (N/A, 06/08/2015); Cardiac catheterization; Cardiac catheterization (N/A, 06/18/2015); Colonoscopy; Multiple extractions with alveoloplasty (N/A, 06/30/2015); Mitral valve repair (Right, 07/08/2015); TEE without cardioversion (N/A, 07/08/2015); Cannulation for cardiopulmonary bypass (N/A, 07/08/2015); AV fistula placement (Left, 10/22/2015); Fistula superficialization (Left, 12/28/2015); Cardiac catheterization (Left, 03/28/2016); Ligation of arteriovenous  fistula (Left, 04/28/2017); Insertion of dialysis catheter (Right, 04/28/2017); AV fistula placement  (Right, 05/22/2017); Exchange of a dialysis catheter (N/A, 06/28/2017); A/V Fistulagram (Right, 08/16/2017); and Fistula superficialization (Right, 08/21/2017). FAMILY HISTORY Her family history includes Breast cancer in her sister; Cancer in her sister; Heart attack in her brother; Heart disease in her brother; Stroke in her mother. SOCIAL HISTORY She  reports that she has never smoked. She has never used smokeless tobacco. She reports that she does not drink alcohol or use drugs.   MEDICARE WELLNESS OBJECTIVES: Physical activity: Current Exercise Habits: Home exercise routine, Type of exercise: walking, Time (Minutes): 30, Frequency (Times/Week): 3, Weekly Exercise (Minutes/Week): 90, Intensity: Mild, Exercise limited by: None identified Cardiac risk factors: Cardiac Risk Factors include: advanced age (>29men, >65 women);dyslipidemia;hypertension;diabetes mellitus;obesity (BMI >30kg/m2) Depression/mood screen:   Depression screen [facility] 2/9 01/29/2020  Decreased Interest 0  Down, Depressed, Hopeless 0  PHQ - 2 Score 0  Altered sleeping 0  Tired, decreased energy 0  Change in appetite 0  Feeling bad or failure about yourself  0  Trouble concentrating 0  Moving slowly or fidgety/restless 0  Suicidal thoughts 0  PHQ-9 Score 0  Difficult doing work/chores Not difficult at all  Some recent data might be hidden    ADLs:  In your present state of health, do you have any difficulty performing the following activities: 01/29/2020 10/30/2019  Hearing? N N  Vision? N N  Difficulty concentrating or making decisions? N N  Walking or climbing stairs? N N  Dressing or bathing? N N  Doing errands, shopping? N N  Some recent data might be hidden     Cognitive Testing  Alert? Yes  Normal Appearance?Yes  Oriented to person? Yes  Place? Yes   Time? Yes  Recall of three objects?  Yes  Can perform simple calculations? Yes  Displays appropriate judgment?Yes  Can read the correct time from a watch  face?Yes  EOL planning: Does Patient Have a Medical Advance Directive?: No Would patient like information on creating a medical advance directive?: Yes (MAU/Ambulatory/Procedural Areas - Information given)  Review of Systems  Constitutional: Negative for malaise/fatigue and weight loss.  HENT: Negative for hearing loss and tinnitus.   Eyes: Negative for blurred vision and double vision.  Respiratory: Negative for cough, shortness of breath and wheezing.   Cardiovascular: Negative for chest pain, palpitations, orthopnea, claudication and leg swelling.  Gastrointestinal: Negative for abdominal pain, blood in stool, constipation, diarrhea, heartburn, melena, nausea and vomiting.  Genitourinary: Negative.   Musculoskeletal: Negative for joint pain and myalgias.  Skin: Negative for rash.  Neurological: Negative for dizziness, tingling, sensory change, weakness and headaches.  Endo/Heme/Allergies: Negative for polydipsia.  Psychiatric/Behavioral: Negative.  All other systems reviewed and are negative.    Objective:     Today's Vitals   01/29/20 1114  BP: 126/66  Pulse: 84  Temp: (!) 96.3 F (35.7 C)  SpO2: 97%  Weight: 181 lb 9.6 oz (82.4 kg)  Height: 4\' 11"  (1.499 m)   Body mass index is 36.68 kg/m.  General appearance: alert, no distress, WD/WN, female HEENT: normocephalic, sclerae anicteric, TMs pearly, nares patent, no discharge or erythema, pharynx normal Oral cavity: MMM, no lesions Neck: supple, no lymphadenopathy, no thyromegaly, no masses Heart: RRR, normal S1, S2, no murmurs. AV fistula to RUE with scarring.  Lungs: CTA bilaterally, no wheezes, rhonchi, or rales Abdomen: +bs, soft, non tender, non distended, no masses, no hepatomegaly, no splenomegaly Musculoskeletal: nontender, no swelling, no obvious deformity Extremities: no edema, no cyanosis, no clubbing Pulses: 2+ symmetric, upper and lower extremities, normal cap refill Neurological: alert, oriented x 3,  CN2-12 intact, strength normal upper extremities and lower extremities, sensation normal throughout, DTRs 2+ throughout, no cerebellar signs, gait normal Psychiatric: normal affect, behavior normal, pleasant   Medicare Attestation I have personally reviewed: The patient's medical and social history Their use of alcohol, tobacco or illicit drugs Their current medications and supplements The patient's functional ability including ADLs,fall risks, home safety risks, cognitive, and hearing and visual impairment Diet and physical activities Evidence for depression or mood disorders  The patient's weight, height, BMI, and visual acuity have been recorded in the chart.  I have made referrals, counseling, and provided education to the patient based on review of the above and I have provided the patient with a written personalized Nguyen plan for preventive services.     Izora Ribas, NP   01/29/2020

## 2020-01-29 ENCOUNTER — Encounter: Payer: Self-pay | Admitting: Adult Health

## 2020-01-29 ENCOUNTER — Other Ambulatory Visit: Payer: Self-pay

## 2020-01-29 ENCOUNTER — Ambulatory Visit: Payer: Self-pay | Admitting: Adult Health

## 2020-01-29 ENCOUNTER — Ambulatory Visit (INDEPENDENT_AMBULATORY_CARE_PROVIDER_SITE_OTHER): Payer: Medicare HMO | Admitting: Adult Health

## 2020-01-29 VITALS — BP 126/66 | HR 84 | Temp 96.3°F | Ht 59.0 in | Wt 181.6 lb

## 2020-01-29 DIAGNOSIS — N186 End stage renal disease: Secondary | ICD-10-CM | POA: Diagnosis not present

## 2020-01-29 DIAGNOSIS — E1122 Type 2 diabetes mellitus with diabetic chronic kidney disease: Secondary | ICD-10-CM | POA: Diagnosis not present

## 2020-01-29 DIAGNOSIS — Z992 Dependence on renal dialysis: Secondary | ICD-10-CM | POA: Diagnosis not present

## 2020-01-29 DIAGNOSIS — E1169 Type 2 diabetes mellitus with other specified complication: Secondary | ICD-10-CM | POA: Diagnosis not present

## 2020-01-29 DIAGNOSIS — Z0000 Encounter for general adult medical examination without abnormal findings: Secondary | ICD-10-CM

## 2020-01-29 DIAGNOSIS — N2581 Secondary hyperparathyroidism of renal origin: Secondary | ICD-10-CM | POA: Diagnosis not present

## 2020-01-29 DIAGNOSIS — I770 Arteriovenous fistula, acquired: Secondary | ICD-10-CM

## 2020-01-29 DIAGNOSIS — Z794 Long term (current) use of insulin: Secondary | ICD-10-CM

## 2020-01-29 DIAGNOSIS — I272 Pulmonary hypertension, unspecified: Secondary | ICD-10-CM

## 2020-01-29 DIAGNOSIS — F325 Major depressive disorder, single episode, in full remission: Secondary | ICD-10-CM

## 2020-01-29 DIAGNOSIS — I5032 Chronic diastolic (congestive) heart failure: Secondary | ICD-10-CM

## 2020-01-29 DIAGNOSIS — D631 Anemia in chronic kidney disease: Secondary | ICD-10-CM

## 2020-01-29 DIAGNOSIS — E785 Hyperlipidemia, unspecified: Secondary | ICD-10-CM

## 2020-01-29 DIAGNOSIS — I480 Paroxysmal atrial fibrillation: Secondary | ICD-10-CM

## 2020-01-29 DIAGNOSIS — I10 Essential (primary) hypertension: Secondary | ICD-10-CM | POA: Diagnosis not present

## 2020-01-29 DIAGNOSIS — K0530 Chronic periodontitis, unspecified: Secondary | ICD-10-CM

## 2020-01-29 DIAGNOSIS — E559 Vitamin D deficiency, unspecified: Secondary | ICD-10-CM

## 2020-01-29 DIAGNOSIS — I429 Cardiomyopathy, unspecified: Secondary | ICD-10-CM | POA: Diagnosis not present

## 2020-01-29 DIAGNOSIS — Z9889 Other specified postprocedural states: Secondary | ICD-10-CM

## 2020-01-29 NOTE — Patient Instructions (Addendum)
Jody Nguyen , Thank you for taking time to come for your Medicare Wellness Visit. I appreciate your ongoing commitment to your health goals. Please review the following plan we discussed and let me know if I can assist you in the future.   These are the goals we discussed: Goals    . Blood Pressure < 140/90    . Exercise 150 min/wk Moderate Activity     Add weights or resistance band program 2-3 days a week     . Weight (lb) < 170 lb (77.1 kg)       This is a list of the screening recommended for you and due dates:  Health Maintenance  Topic Date Due  . Hemoglobin A1C  04/29/2020  . Eye exam for diabetics  08/25/2020  . Complete foot exam   10/29/2020  . Mammogram  09/17/2021  . Pap Smear  10/30/2021  . Colon Cancer Screening  01/18/2022  . Tetanus Vaccine  01/23/2029  . Flu Shot  Completed  .  Hepatitis C: One time screening is recommended by Center for Disease Control  (CDC) for  adults born from [birth year] through 1966.   Completed  . HIV Screening  Completed     Please schedule a follow up with Dr. Orene Desanctis      Drink 1/2 your body weight in fluid ounces of water daily; drink a tall glass of water 30 min before meals  Don't eat until you're stuffed- listen to your stomach and eat until you are 80% full   Try eating off of a salad plate; wait 10 min after finishing before going back for seconds  Start by eating the vegetables on your plate; aim for 50% of your meals to be fruits or vegetables  Then eat your protein - lean meats (grass fed if possible), fish, beans, nuts in moderation  Eat your carbs/starch last ONLY if you still are hungry. If you can, stop before finishing it all  Avoid sugar and flour - the closer it looks to it's original form in nature, typically the better it is for you  Splurge in moderation - "assign" days when you get to splurge and have the "bad stuff" - I like to follow a 80% - 20% plan- "good" choices 80 % of the time, "bad" choices in moderation  20% of the time  Simple equation is: Calories out > calories in = weight loss - even if you eat the bad stuff, if you limit portions, you will still lose weight       Exercising to Lose Weight Exercise is structured, repetitive physical activity to improve fitness and health. Getting regular exercise is important for everyone. It is especially important if you are overweight. Being overweight increases your risk of heart disease, stroke, diabetes, high blood pressure, and several types of cancer. Reducing your calorie intake and exercising can help you lose weight. Exercise is usually categorized as moderate or vigorous intensity. To lose weight, most people need to do a certain amount of moderate-intensity or vigorous-intensity exercise each week. Moderate-intensity exercise  Moderate-intensity exercise is any activity that gets you moving enough to burn at least three times more energy (calories) than if you were sitting. Examples of moderate exercise include:  Walking a mile in 15 minutes.  Doing light yard work.  Biking at an easy pace. Most people should get at least 150 minutes (2 hours and 30 minutes) a week of moderate-intensity exercise to maintain their body weight. Vigorous-intensity exercise Vigorous-intensity  exercise is any activity that gets you moving enough to burn at least six times more calories than if you were sitting. When you exercise at this intensity, you should be working hard enough that you are not able to carry on a conversation. Examples of vigorous exercise include:  Running.  Playing a team sport, such as football, basketball, and soccer.  Jumping rope. Most people should get at least 75 minutes (1 hour and 15 minutes) a week of vigorous-intensity exercise to maintain their body weight. How can exercise affect me? When you exercise enough to burn more calories than you eat, you lose weight. Exercise also reduces body fat and builds muscle. The more  muscle you have, the more calories you burn. Exercise also:  Improves mood.  Reduces stress and tension.  Improves your overall fitness, flexibility, and endurance.  Increases bone strength. The amount of exercise you need to lose weight depends on:  Your age.  The type of exercise.  Any health conditions you have.  Your overall physical ability. Talk to your health care provider about how much exercise you need and what types of activities are safe for you. What actions can I take to lose weight? Nutrition   Make changes to your diet as told by your health care provider or diet and nutrition specialist (dietitian). This may include: ? Eating fewer calories. ? Eating more protein. ? Eating less unhealthy fats. ? Eating a diet that includes fresh fruits and vegetables, whole grains, low-fat dairy products, and lean protein. ? Avoiding foods with added fat, salt, and sugar.  Drink plenty of water while you exercise to prevent dehydration or heat stroke. Activity  Choose an activity that you enjoy and set realistic goals. Your health care provider can help you make an exercise plan that works for you.  Exercise at a moderate or vigorous intensity most days of the week. ? The intensity of exercise may vary from person to person. You can tell how intense a workout is for you by paying attention to your breathing and heartbeat. Most people will notice their breathing and heartbeat get faster with more intense exercise.  Do resistance training twice each week, such as: ? Push-ups. ? Sit-ups. ? Lifting weights. ? Using resistance bands.  Getting short amounts of exercise can be just as helpful as long structured periods of exercise. If you have trouble finding time to exercise, try to include exercise in your daily routine. ? Get up, stretch, and walk around every 30 minutes throughout the day. ? Go for a walk during your lunch break. ? Park your car farther away from your  destination. ? If you take public transportation, get off one stop early and walk the rest of the way. ? Make phone calls while standing up and walking around. ? Take the stairs instead of elevators or escalators.  Wear comfortable clothes and shoes with good support.  Do not exercise so much that you hurt yourself, feel dizzy, or get very short of breath. Where to find more information  U.S. Department of Health and Human Services: [employer]at  Centers for Disease Control and Prevention (CDC): http://www.wolf.info/ Contact a health care provider:  Before starting a new exercise program.  If you have questions or concerns about your weight.  If you have a medical problem that keeps you from exercising. Get help right away if you have any of the following while exercising:  Injury.  Dizziness.  Difficulty breathing or shortness of breath that does  not go away when you stop exercising.  Chest pain.  Rapid heartbeat. Summary  Being overweight increases your risk of heart disease, stroke, diabetes, high blood pressure, and several types of cancer.  Losing weight happens when you burn more calories than you eat.  Reducing the amount of calories you eat in addition to getting regular moderate or vigorous exercise each week helps you lose weight. This information is not intended to replace advice given to you by your health care provider. Make sure you discuss any questions you have with your health care provider. Document Revised: 11/26/2017 Document Reviewed: 11/26/2017 Elsevier Patient Education  2020 [employer].

## 2020-01-30 DIAGNOSIS — N186 End stage renal disease: Secondary | ICD-10-CM | POA: Diagnosis not present

## 2020-01-30 DIAGNOSIS — Z992 Dependence on renal dialysis: Secondary | ICD-10-CM | POA: Diagnosis not present

## 2020-01-30 DIAGNOSIS — N2581 Secondary hyperparathyroidism of renal origin: Secondary | ICD-10-CM | POA: Diagnosis not present

## 2020-01-30 LAB — CBC WITH DIFFERENTIAL/PLATELET
Absolute Monocytes: 488 cells/uL (ref 200–950)
Basophils Absolute: 48 cells/uL (ref 0–200)
Basophils Relative: 1.2 %
Eosinophils Absolute: 128 cells/uL (ref 15–500)
Eosinophils Relative: 3.2 %
HCT: 34.6 % — ABNORMAL LOW (ref 35.0–45.0)
Hemoglobin: 11.1 g/dL — ABNORMAL LOW (ref 11.7–15.5)
Lymphs Abs: 1364 cells/uL (ref 850–3900)
MCH: 27.2 pg (ref 27.0–33.0)
MCHC: 32.1 g/dL (ref 32.0–36.0)
MCV: 84.8 fL (ref 80.0–100.0)
MPV: 13.4 fL — ABNORMAL HIGH (ref 7.5–12.5)
Monocytes Relative: 12.2 %
Neutro Abs: 1972 cells/uL (ref 1500–7800)
Neutrophils Relative %: 49.3 %
Platelets: 207 10*3/uL (ref 140–400)
RBC: 4.08 10*6/uL (ref 3.80–5.10)
RDW: 15.8 % — ABNORMAL HIGH (ref 11.0–15.0)
Total Lymphocyte: 34.1 %
WBC: 4 10*3/uL (ref 3.8–10.8)

## 2020-01-30 LAB — COMPLETE METABOLIC PANEL WITH GFR
AG Ratio: 1.3 (calc) (ref 1.0–2.5)
ALT: 7 U/L (ref 6–29)
AST: 8 U/L — ABNORMAL LOW (ref 10–35)
Albumin: 4.4 g/dL (ref 3.6–5.1)
Alkaline phosphatase (APISO): 48 U/L (ref 37–153)
BUN/Creatinine Ratio: 6 (calc) (ref 6–22)
BUN: 47 mg/dL — ABNORMAL HIGH (ref 7–25)
CO2: 34 mmol/L — ABNORMAL HIGH (ref 20–32)
Calcium: 10 mg/dL (ref 8.6–10.4)
Chloride: 91 mmol/L — ABNORMAL LOW (ref 98–110)
Creat: 7.77 mg/dL — ABNORMAL HIGH (ref 0.50–0.99)
GFR, Est African American: 6 mL/min/{1.73_m2} — ABNORMAL LOW (ref >=60)
GFR, Est Non African American: 5 mL/min/{1.73_m2} — ABNORMAL LOW (ref >=60)
Globulin: 3.5 g/dL (calc) (ref 1.9–3.7)
Glucose, Bld: 128 mg/dL — ABNORMAL HIGH (ref 65–99)
Potassium: 4.3 mmol/L (ref 3.5–5.3)
Sodium: 138 mmol/L (ref 135–146)
Total Bilirubin: 0.5 mg/dL (ref 0.2–1.2)
Total Protein: 7.9 g/dL (ref 6.1–8.1)

## 2020-01-30 LAB — HEMOGLOBIN A1C
Hgb A1c MFr Bld: 6.2 % of total Hgb — ABNORMAL HIGH (ref <5.7)
Mean Plasma Glucose: 131 (calc)
eAG (mmol/L): 7.3 (calc)

## 2020-01-30 LAB — LIPID PANEL
Cholesterol: 193 mg/dL (ref <200)
HDL: 52 mg/dL (ref >=50)
LDL Cholesterol (Calc): 115 mg/dL (calc) — ABNORMAL HIGH
Non-HDL Cholesterol (Calc): 141 mg/dL (calc) — ABNORMAL HIGH (ref <130)
Total CHOL/HDL Ratio: 3.7 (calc) (ref <5.0)
Triglycerides: 138 mg/dL (ref <150)

## 2020-01-30 LAB — TSH: TSH: 2.4 mIU/L (ref 0.40–4.50)

## 2020-01-30 LAB — MAGNESIUM: Magnesium: 2.4 mg/dL (ref 1.5–2.5)

## 2020-02-02 DIAGNOSIS — Z992 Dependence on renal dialysis: Secondary | ICD-10-CM | POA: Diagnosis not present

## 2020-02-02 DIAGNOSIS — N186 End stage renal disease: Secondary | ICD-10-CM | POA: Diagnosis not present

## 2020-02-02 DIAGNOSIS — N2581 Secondary hyperparathyroidism of renal origin: Secondary | ICD-10-CM | POA: Diagnosis not present

## 2020-02-04 DIAGNOSIS — N186 End stage renal disease: Secondary | ICD-10-CM | POA: Diagnosis not present

## 2020-02-04 DIAGNOSIS — Z992 Dependence on renal dialysis: Secondary | ICD-10-CM | POA: Diagnosis not present

## 2020-02-04 DIAGNOSIS — N2581 Secondary hyperparathyroidism of renal origin: Secondary | ICD-10-CM | POA: Diagnosis not present

## 2020-02-06 DIAGNOSIS — N2581 Secondary hyperparathyroidism of renal origin: Secondary | ICD-10-CM | POA: Diagnosis not present

## 2020-02-06 DIAGNOSIS — N186 End stage renal disease: Secondary | ICD-10-CM | POA: Diagnosis not present

## 2020-02-06 DIAGNOSIS — Z992 Dependence on renal dialysis: Secondary | ICD-10-CM | POA: Diagnosis not present

## 2020-02-09 DIAGNOSIS — N2581 Secondary hyperparathyroidism of renal origin: Secondary | ICD-10-CM | POA: Diagnosis not present

## 2020-02-09 DIAGNOSIS — N186 End stage renal disease: Secondary | ICD-10-CM | POA: Diagnosis not present

## 2020-02-09 DIAGNOSIS — Z992 Dependence on renal dialysis: Secondary | ICD-10-CM | POA: Diagnosis not present

## 2020-02-11 DIAGNOSIS — N2581 Secondary hyperparathyroidism of renal origin: Secondary | ICD-10-CM | POA: Diagnosis not present

## 2020-02-11 DIAGNOSIS — Z992 Dependence on renal dialysis: Secondary | ICD-10-CM | POA: Diagnosis not present

## 2020-02-11 DIAGNOSIS — N186 End stage renal disease: Secondary | ICD-10-CM | POA: Diagnosis not present

## 2020-02-13 DIAGNOSIS — Z992 Dependence on renal dialysis: Secondary | ICD-10-CM | POA: Diagnosis not present

## 2020-02-13 DIAGNOSIS — N186 End stage renal disease: Secondary | ICD-10-CM | POA: Diagnosis not present

## 2020-02-13 DIAGNOSIS — N2581 Secondary hyperparathyroidism of renal origin: Secondary | ICD-10-CM | POA: Diagnosis not present

## 2020-02-16 DIAGNOSIS — N2581 Secondary hyperparathyroidism of renal origin: Secondary | ICD-10-CM | POA: Diagnosis not present

## 2020-02-16 DIAGNOSIS — Z992 Dependence on renal dialysis: Secondary | ICD-10-CM | POA: Diagnosis not present

## 2020-02-16 DIAGNOSIS — N186 End stage renal disease: Secondary | ICD-10-CM | POA: Diagnosis not present

## 2020-02-18 DIAGNOSIS — N2581 Secondary hyperparathyroidism of renal origin: Secondary | ICD-10-CM | POA: Diagnosis not present

## 2020-02-18 DIAGNOSIS — Z992 Dependence on renal dialysis: Secondary | ICD-10-CM | POA: Diagnosis not present

## 2020-02-18 DIAGNOSIS — N186 End stage renal disease: Secondary | ICD-10-CM | POA: Diagnosis not present

## 2020-02-20 DIAGNOSIS — N186 End stage renal disease: Secondary | ICD-10-CM | POA: Diagnosis not present

## 2020-02-20 DIAGNOSIS — Z992 Dependence on renal dialysis: Secondary | ICD-10-CM | POA: Diagnosis not present

## 2020-02-20 DIAGNOSIS — N2581 Secondary hyperparathyroidism of renal origin: Secondary | ICD-10-CM | POA: Diagnosis not present

## 2020-02-23 DIAGNOSIS — Z992 Dependence on renal dialysis: Secondary | ICD-10-CM | POA: Diagnosis not present

## 2020-02-23 DIAGNOSIS — N186 End stage renal disease: Secondary | ICD-10-CM | POA: Diagnosis not present

## 2020-02-23 DIAGNOSIS — N2581 Secondary hyperparathyroidism of renal origin: Secondary | ICD-10-CM | POA: Diagnosis not present

## 2020-02-25 DIAGNOSIS — E1129 Type 2 diabetes mellitus with other diabetic kidney complication: Secondary | ICD-10-CM | POA: Diagnosis not present

## 2020-02-25 DIAGNOSIS — N2581 Secondary hyperparathyroidism of renal origin: Secondary | ICD-10-CM | POA: Diagnosis not present

## 2020-02-25 DIAGNOSIS — N186 End stage renal disease: Secondary | ICD-10-CM | POA: Diagnosis not present

## 2020-02-25 DIAGNOSIS — Z992 Dependence on renal dialysis: Secondary | ICD-10-CM | POA: Diagnosis not present

## 2020-02-27 DIAGNOSIS — E118 Type 2 diabetes mellitus with unspecified complications: Secondary | ICD-10-CM | POA: Diagnosis not present

## 2020-02-27 DIAGNOSIS — N2581 Secondary hyperparathyroidism of renal origin: Secondary | ICD-10-CM | POA: Diagnosis not present

## 2020-02-27 DIAGNOSIS — Z992 Dependence on renal dialysis: Secondary | ICD-10-CM | POA: Diagnosis not present

## 2020-02-27 DIAGNOSIS — N186 End stage renal disease: Secondary | ICD-10-CM | POA: Diagnosis not present

## 2020-02-27 DIAGNOSIS — D631 Anemia in chronic kidney disease: Secondary | ICD-10-CM | POA: Diagnosis not present

## 2020-02-27 DIAGNOSIS — D689 Coagulation defect, unspecified: Secondary | ICD-10-CM | POA: Diagnosis not present

## 2020-02-27 DIAGNOSIS — D509 Iron deficiency anemia, unspecified: Secondary | ICD-10-CM | POA: Diagnosis not present

## 2020-02-27 DIAGNOSIS — D500 Iron deficiency anemia secondary to blood loss (chronic): Secondary | ICD-10-CM | POA: Diagnosis not present

## 2020-03-01 DIAGNOSIS — E118 Type 2 diabetes mellitus with unspecified complications: Secondary | ICD-10-CM | POA: Diagnosis not present

## 2020-03-01 DIAGNOSIS — N186 End stage renal disease: Secondary | ICD-10-CM | POA: Diagnosis not present

## 2020-03-01 DIAGNOSIS — N2581 Secondary hyperparathyroidism of renal origin: Secondary | ICD-10-CM | POA: Diagnosis not present

## 2020-03-01 DIAGNOSIS — D689 Coagulation defect, unspecified: Secondary | ICD-10-CM | POA: Diagnosis not present

## 2020-03-01 DIAGNOSIS — D631 Anemia in chronic kidney disease: Secondary | ICD-10-CM | POA: Diagnosis not present

## 2020-03-01 DIAGNOSIS — D500 Iron deficiency anemia secondary to blood loss (chronic): Secondary | ICD-10-CM | POA: Diagnosis not present

## 2020-03-01 DIAGNOSIS — Z992 Dependence on renal dialysis: Secondary | ICD-10-CM | POA: Diagnosis not present

## 2020-03-01 DIAGNOSIS — D509 Iron deficiency anemia, unspecified: Secondary | ICD-10-CM | POA: Diagnosis not present

## 2020-03-03 DIAGNOSIS — N2581 Secondary hyperparathyroidism of renal origin: Secondary | ICD-10-CM | POA: Diagnosis not present

## 2020-03-03 DIAGNOSIS — E118 Type 2 diabetes mellitus with unspecified complications: Secondary | ICD-10-CM | POA: Diagnosis not present

## 2020-03-03 DIAGNOSIS — D631 Anemia in chronic kidney disease: Secondary | ICD-10-CM | POA: Diagnosis not present

## 2020-03-03 DIAGNOSIS — N186 End stage renal disease: Secondary | ICD-10-CM | POA: Diagnosis not present

## 2020-03-03 DIAGNOSIS — D500 Iron deficiency anemia secondary to blood loss (chronic): Secondary | ICD-10-CM | POA: Diagnosis not present

## 2020-03-03 DIAGNOSIS — Z992 Dependence on renal dialysis: Secondary | ICD-10-CM | POA: Diagnosis not present

## 2020-03-03 DIAGNOSIS — D509 Iron deficiency anemia, unspecified: Secondary | ICD-10-CM | POA: Diagnosis not present

## 2020-03-03 DIAGNOSIS — D689 Coagulation defect, unspecified: Secondary | ICD-10-CM | POA: Diagnosis not present

## 2020-03-05 DIAGNOSIS — N186 End stage renal disease: Secondary | ICD-10-CM | POA: Diagnosis not present

## 2020-03-05 DIAGNOSIS — N2581 Secondary hyperparathyroidism of renal origin: Secondary | ICD-10-CM | POA: Diagnosis not present

## 2020-03-05 DIAGNOSIS — D631 Anemia in chronic kidney disease: Secondary | ICD-10-CM | POA: Diagnosis not present

## 2020-03-05 DIAGNOSIS — D509 Iron deficiency anemia, unspecified: Secondary | ICD-10-CM | POA: Diagnosis not present

## 2020-03-05 DIAGNOSIS — Z992 Dependence on renal dialysis: Secondary | ICD-10-CM | POA: Diagnosis not present

## 2020-03-05 DIAGNOSIS — D689 Coagulation defect, unspecified: Secondary | ICD-10-CM | POA: Diagnosis not present

## 2020-03-05 DIAGNOSIS — E118 Type 2 diabetes mellitus with unspecified complications: Secondary | ICD-10-CM | POA: Diagnosis not present

## 2020-03-05 DIAGNOSIS — D500 Iron deficiency anemia secondary to blood loss (chronic): Secondary | ICD-10-CM | POA: Diagnosis not present

## 2020-03-08 DIAGNOSIS — Z992 Dependence on renal dialysis: Secondary | ICD-10-CM | POA: Diagnosis not present

## 2020-03-08 DIAGNOSIS — E118 Type 2 diabetes mellitus with unspecified complications: Secondary | ICD-10-CM | POA: Diagnosis not present

## 2020-03-08 DIAGNOSIS — N186 End stage renal disease: Secondary | ICD-10-CM | POA: Diagnosis not present

## 2020-03-08 DIAGNOSIS — D509 Iron deficiency anemia, unspecified: Secondary | ICD-10-CM | POA: Diagnosis not present

## 2020-03-08 DIAGNOSIS — D689 Coagulation defect, unspecified: Secondary | ICD-10-CM | POA: Diagnosis not present

## 2020-03-08 DIAGNOSIS — D500 Iron deficiency anemia secondary to blood loss (chronic): Secondary | ICD-10-CM | POA: Diagnosis not present

## 2020-03-08 DIAGNOSIS — D631 Anemia in chronic kidney disease: Secondary | ICD-10-CM | POA: Diagnosis not present

## 2020-03-08 DIAGNOSIS — N2581 Secondary hyperparathyroidism of renal origin: Secondary | ICD-10-CM | POA: Diagnosis not present

## 2020-03-10 DIAGNOSIS — N186 End stage renal disease: Secondary | ICD-10-CM | POA: Diagnosis not present

## 2020-03-10 DIAGNOSIS — E118 Type 2 diabetes mellitus with unspecified complications: Secondary | ICD-10-CM | POA: Diagnosis not present

## 2020-03-10 DIAGNOSIS — D500 Iron deficiency anemia secondary to blood loss (chronic): Secondary | ICD-10-CM | POA: Diagnosis not present

## 2020-03-10 DIAGNOSIS — N2581 Secondary hyperparathyroidism of renal origin: Secondary | ICD-10-CM | POA: Diagnosis not present

## 2020-03-10 DIAGNOSIS — D689 Coagulation defect, unspecified: Secondary | ICD-10-CM | POA: Diagnosis not present

## 2020-03-10 DIAGNOSIS — D509 Iron deficiency anemia, unspecified: Secondary | ICD-10-CM | POA: Diagnosis not present

## 2020-03-10 DIAGNOSIS — D631 Anemia in chronic kidney disease: Secondary | ICD-10-CM | POA: Diagnosis not present

## 2020-03-10 DIAGNOSIS — Z992 Dependence on renal dialysis: Secondary | ICD-10-CM | POA: Diagnosis not present

## 2020-03-12 DIAGNOSIS — D509 Iron deficiency anemia, unspecified: Secondary | ICD-10-CM | POA: Diagnosis not present

## 2020-03-12 DIAGNOSIS — N2581 Secondary hyperparathyroidism of renal origin: Secondary | ICD-10-CM | POA: Diagnosis not present

## 2020-03-12 DIAGNOSIS — D500 Iron deficiency anemia secondary to blood loss (chronic): Secondary | ICD-10-CM | POA: Diagnosis not present

## 2020-03-12 DIAGNOSIS — D631 Anemia in chronic kidney disease: Secondary | ICD-10-CM | POA: Diagnosis not present

## 2020-03-12 DIAGNOSIS — D689 Coagulation defect, unspecified: Secondary | ICD-10-CM | POA: Diagnosis not present

## 2020-03-12 DIAGNOSIS — E118 Type 2 diabetes mellitus with unspecified complications: Secondary | ICD-10-CM | POA: Diagnosis not present

## 2020-03-12 DIAGNOSIS — Z992 Dependence on renal dialysis: Secondary | ICD-10-CM | POA: Diagnosis not present

## 2020-03-12 DIAGNOSIS — N186 End stage renal disease: Secondary | ICD-10-CM | POA: Diagnosis not present

## 2020-03-15 DIAGNOSIS — D689 Coagulation defect, unspecified: Secondary | ICD-10-CM | POA: Diagnosis not present

## 2020-03-15 DIAGNOSIS — E118 Type 2 diabetes mellitus with unspecified complications: Secondary | ICD-10-CM | POA: Diagnosis not present

## 2020-03-15 DIAGNOSIS — D509 Iron deficiency anemia, unspecified: Secondary | ICD-10-CM | POA: Diagnosis not present

## 2020-03-15 DIAGNOSIS — D500 Iron deficiency anemia secondary to blood loss (chronic): Secondary | ICD-10-CM | POA: Diagnosis not present

## 2020-03-15 DIAGNOSIS — N2581 Secondary hyperparathyroidism of renal origin: Secondary | ICD-10-CM | POA: Diagnosis not present

## 2020-03-15 DIAGNOSIS — D631 Anemia in chronic kidney disease: Secondary | ICD-10-CM | POA: Diagnosis not present

## 2020-03-15 DIAGNOSIS — N186 End stage renal disease: Secondary | ICD-10-CM | POA: Diagnosis not present

## 2020-03-15 DIAGNOSIS — Z992 Dependence on renal dialysis: Secondary | ICD-10-CM | POA: Diagnosis not present

## 2020-03-16 DIAGNOSIS — I132 Hypertensive heart and chronic kidney disease with heart failure and with stage 5 chronic kidney disease, or end stage renal disease: Secondary | ICD-10-CM | POA: Diagnosis not present

## 2020-03-16 DIAGNOSIS — N186 End stage renal disease: Secondary | ICD-10-CM | POA: Diagnosis not present

## 2020-03-16 DIAGNOSIS — I5032 Chronic diastolic (congestive) heart failure: Secondary | ICD-10-CM | POA: Diagnosis not present

## 2020-03-16 DIAGNOSIS — I110 Hypertensive heart disease with heart failure: Secondary | ICD-10-CM | POA: Diagnosis not present

## 2020-03-16 DIAGNOSIS — I272 Pulmonary hypertension, unspecified: Secondary | ICD-10-CM | POA: Diagnosis not present

## 2020-03-16 DIAGNOSIS — Z79899 Other long term (current) drug therapy: Secondary | ICD-10-CM | POA: Diagnosis not present

## 2020-03-16 DIAGNOSIS — N184 Chronic kidney disease, stage 4 (severe): Secondary | ICD-10-CM | POA: Diagnosis not present

## 2020-03-17 ENCOUNTER — Other Ambulatory Visit: Payer: Self-pay | Admitting: Internal Medicine

## 2020-03-17 DIAGNOSIS — D509 Iron deficiency anemia, unspecified: Secondary | ICD-10-CM | POA: Diagnosis not present

## 2020-03-17 DIAGNOSIS — N186 End stage renal disease: Secondary | ICD-10-CM | POA: Diagnosis not present

## 2020-03-17 DIAGNOSIS — Z992 Dependence on renal dialysis: Secondary | ICD-10-CM | POA: Diagnosis not present

## 2020-03-17 DIAGNOSIS — N2581 Secondary hyperparathyroidism of renal origin: Secondary | ICD-10-CM | POA: Diagnosis not present

## 2020-03-17 DIAGNOSIS — E118 Type 2 diabetes mellitus with unspecified complications: Secondary | ICD-10-CM | POA: Diagnosis not present

## 2020-03-17 DIAGNOSIS — D500 Iron deficiency anemia secondary to blood loss (chronic): Secondary | ICD-10-CM | POA: Diagnosis not present

## 2020-03-17 DIAGNOSIS — D689 Coagulation defect, unspecified: Secondary | ICD-10-CM | POA: Diagnosis not present

## 2020-03-17 DIAGNOSIS — D631 Anemia in chronic kidney disease: Secondary | ICD-10-CM | POA: Diagnosis not present

## 2020-03-18 DIAGNOSIS — T82858A Stenosis of vascular prosthetic devices, implants and grafts, initial encounter: Secondary | ICD-10-CM | POA: Diagnosis not present

## 2020-03-18 DIAGNOSIS — I871 Compression of vein: Secondary | ICD-10-CM | POA: Diagnosis not present

## 2020-03-18 DIAGNOSIS — N186 End stage renal disease: Secondary | ICD-10-CM | POA: Diagnosis not present

## 2020-03-18 DIAGNOSIS — Z992 Dependence on renal dialysis: Secondary | ICD-10-CM | POA: Diagnosis not present

## 2020-03-19 ENCOUNTER — Other Ambulatory Visit: Payer: Self-pay | Admitting: Internal Medicine

## 2020-03-19 DIAGNOSIS — N2581 Secondary hyperparathyroidism of renal origin: Secondary | ICD-10-CM | POA: Diagnosis not present

## 2020-03-19 DIAGNOSIS — I272 Pulmonary hypertension, unspecified: Secondary | ICD-10-CM

## 2020-03-19 DIAGNOSIS — Z992 Dependence on renal dialysis: Secondary | ICD-10-CM | POA: Diagnosis not present

## 2020-03-19 DIAGNOSIS — D500 Iron deficiency anemia secondary to blood loss (chronic): Secondary | ICD-10-CM | POA: Diagnosis not present

## 2020-03-19 DIAGNOSIS — F325 Major depressive disorder, single episode, in full remission: Secondary | ICD-10-CM

## 2020-03-19 DIAGNOSIS — Z794 Long term (current) use of insulin: Secondary | ICD-10-CM

## 2020-03-19 DIAGNOSIS — D689 Coagulation defect, unspecified: Secondary | ICD-10-CM | POA: Diagnosis not present

## 2020-03-19 DIAGNOSIS — E782 Mixed hyperlipidemia: Secondary | ICD-10-CM

## 2020-03-19 DIAGNOSIS — I480 Paroxysmal atrial fibrillation: Secondary | ICD-10-CM

## 2020-03-19 DIAGNOSIS — I5032 Chronic diastolic (congestive) heart failure: Secondary | ICD-10-CM

## 2020-03-19 DIAGNOSIS — E118 Type 2 diabetes mellitus with unspecified complications: Secondary | ICD-10-CM | POA: Diagnosis not present

## 2020-03-19 DIAGNOSIS — D631 Anemia in chronic kidney disease: Secondary | ICD-10-CM | POA: Diagnosis not present

## 2020-03-19 DIAGNOSIS — N186 End stage renal disease: Secondary | ICD-10-CM | POA: Diagnosis not present

## 2020-03-19 DIAGNOSIS — D509 Iron deficiency anemia, unspecified: Secondary | ICD-10-CM | POA: Diagnosis not present

## 2020-03-19 DIAGNOSIS — E1122 Type 2 diabetes mellitus with diabetic chronic kidney disease: Secondary | ICD-10-CM

## 2020-03-19 MED ORDER — LOSARTAN POTASSIUM 50 MG PO TABS: ORAL_TABLET | ORAL | 1 refills | Status: AC

## 2020-03-19 MED ORDER — ROSUVASTATIN CALCIUM 20 MG PO TABS: ORAL_TABLET | ORAL | 3 refills | Status: AC

## 2020-03-19 MED ORDER — BUSPIRONE HCL 10 MG PO TABS: ORAL_TABLET | ORAL | 1 refills | Status: DC

## 2020-03-19 MED ORDER — SERTRALINE HCL 25 MG PO TABS: ORAL_TABLET | ORAL | 3 refills | Status: AC

## 2020-03-19 MED ORDER — HYDRALAZINE HCL 25 MG PO TABS: ORAL_TABLET | ORAL | 0 refills | Status: DC

## 2020-03-19 MED ORDER — CARVEDILOL 25 MG PO TABS: ORAL_TABLET | ORAL | 0 refills | Status: DC

## 2020-03-22 DIAGNOSIS — N2581 Secondary hyperparathyroidism of renal origin: Secondary | ICD-10-CM | POA: Diagnosis not present

## 2020-03-22 DIAGNOSIS — D631 Anemia in chronic kidney disease: Secondary | ICD-10-CM | POA: Diagnosis not present

## 2020-03-22 DIAGNOSIS — D689 Coagulation defect, unspecified: Secondary | ICD-10-CM | POA: Diagnosis not present

## 2020-03-22 DIAGNOSIS — D509 Iron deficiency anemia, unspecified: Secondary | ICD-10-CM | POA: Diagnosis not present

## 2020-03-22 DIAGNOSIS — D500 Iron deficiency anemia secondary to blood loss (chronic): Secondary | ICD-10-CM | POA: Diagnosis not present

## 2020-03-22 DIAGNOSIS — E118 Type 2 diabetes mellitus with unspecified complications: Secondary | ICD-10-CM | POA: Diagnosis not present

## 2020-03-22 DIAGNOSIS — N186 End stage renal disease: Secondary | ICD-10-CM | POA: Diagnosis not present

## 2020-03-22 DIAGNOSIS — Z992 Dependence on renal dialysis: Secondary | ICD-10-CM | POA: Diagnosis not present

## 2020-03-23 ENCOUNTER — Other Ambulatory Visit: Payer: Self-pay | Admitting: *Deleted

## 2020-03-24 DIAGNOSIS — D631 Anemia in chronic kidney disease: Secondary | ICD-10-CM | POA: Diagnosis not present

## 2020-03-24 DIAGNOSIS — D500 Iron deficiency anemia secondary to blood loss (chronic): Secondary | ICD-10-CM | POA: Diagnosis not present

## 2020-03-24 DIAGNOSIS — E118 Type 2 diabetes mellitus with unspecified complications: Secondary | ICD-10-CM | POA: Diagnosis not present

## 2020-03-24 DIAGNOSIS — D509 Iron deficiency anemia, unspecified: Secondary | ICD-10-CM | POA: Diagnosis not present

## 2020-03-24 DIAGNOSIS — N2581 Secondary hyperparathyroidism of renal origin: Secondary | ICD-10-CM | POA: Diagnosis not present

## 2020-03-24 DIAGNOSIS — D689 Coagulation defect, unspecified: Secondary | ICD-10-CM | POA: Diagnosis not present

## 2020-03-24 DIAGNOSIS — N186 End stage renal disease: Secondary | ICD-10-CM | POA: Diagnosis not present

## 2020-03-24 DIAGNOSIS — Z992 Dependence on renal dialysis: Secondary | ICD-10-CM | POA: Diagnosis not present

## 2020-03-26 ENCOUNTER — Other Ambulatory Visit: Payer: Self-pay | Admitting: Internal Medicine

## 2020-03-26 DIAGNOSIS — N186 End stage renal disease: Secondary | ICD-10-CM | POA: Diagnosis not present

## 2020-03-26 DIAGNOSIS — D500 Iron deficiency anemia secondary to blood loss (chronic): Secondary | ICD-10-CM | POA: Diagnosis not present

## 2020-03-26 DIAGNOSIS — I272 Pulmonary hypertension, unspecified: Secondary | ICD-10-CM

## 2020-03-26 DIAGNOSIS — D509 Iron deficiency anemia, unspecified: Secondary | ICD-10-CM | POA: Diagnosis not present

## 2020-03-26 DIAGNOSIS — D689 Coagulation defect, unspecified: Secondary | ICD-10-CM | POA: Diagnosis not present

## 2020-03-26 DIAGNOSIS — E1129 Type 2 diabetes mellitus with other diabetic kidney complication: Secondary | ICD-10-CM | POA: Diagnosis not present

## 2020-03-26 DIAGNOSIS — Z992 Dependence on renal dialysis: Secondary | ICD-10-CM | POA: Diagnosis not present

## 2020-03-26 DIAGNOSIS — D631 Anemia in chronic kidney disease: Secondary | ICD-10-CM | POA: Diagnosis not present

## 2020-03-26 DIAGNOSIS — N2581 Secondary hyperparathyroidism of renal origin: Secondary | ICD-10-CM | POA: Diagnosis not present

## 2020-03-26 DIAGNOSIS — E118 Type 2 diabetes mellitus with unspecified complications: Secondary | ICD-10-CM | POA: Diagnosis not present

## 2020-03-26 MED ORDER — SILDENAFIL CITRATE 20 MG PO TABS: ORAL_TABLET | ORAL | 3 refills | Status: AC

## 2020-03-29 DIAGNOSIS — N186 End stage renal disease: Secondary | ICD-10-CM | POA: Diagnosis not present

## 2020-03-29 DIAGNOSIS — Z992 Dependence on renal dialysis: Secondary | ICD-10-CM | POA: Diagnosis not present

## 2020-03-29 DIAGNOSIS — D631 Anemia in chronic kidney disease: Secondary | ICD-10-CM | POA: Diagnosis not present

## 2020-03-29 DIAGNOSIS — D689 Coagulation defect, unspecified: Secondary | ICD-10-CM | POA: Diagnosis not present

## 2020-03-29 DIAGNOSIS — D500 Iron deficiency anemia secondary to blood loss (chronic): Secondary | ICD-10-CM | POA: Diagnosis not present

## 2020-03-29 DIAGNOSIS — N2581 Secondary hyperparathyroidism of renal origin: Secondary | ICD-10-CM | POA: Diagnosis not present

## 2020-03-29 DIAGNOSIS — D509 Iron deficiency anemia, unspecified: Secondary | ICD-10-CM | POA: Diagnosis not present

## 2020-03-31 DIAGNOSIS — D509 Iron deficiency anemia, unspecified: Secondary | ICD-10-CM | POA: Diagnosis not present

## 2020-03-31 DIAGNOSIS — N186 End stage renal disease: Secondary | ICD-10-CM | POA: Diagnosis not present

## 2020-03-31 DIAGNOSIS — N2581 Secondary hyperparathyroidism of renal origin: Secondary | ICD-10-CM | POA: Diagnosis not present

## 2020-03-31 DIAGNOSIS — D689 Coagulation defect, unspecified: Secondary | ICD-10-CM | POA: Diagnosis not present

## 2020-03-31 DIAGNOSIS — Z992 Dependence on renal dialysis: Secondary | ICD-10-CM | POA: Diagnosis not present

## 2020-03-31 DIAGNOSIS — D500 Iron deficiency anemia secondary to blood loss (chronic): Secondary | ICD-10-CM | POA: Diagnosis not present

## 2020-03-31 DIAGNOSIS — D631 Anemia in chronic kidney disease: Secondary | ICD-10-CM | POA: Diagnosis not present

## 2020-04-02 DIAGNOSIS — D509 Iron deficiency anemia, unspecified: Secondary | ICD-10-CM | POA: Diagnosis not present

## 2020-04-02 DIAGNOSIS — Z992 Dependence on renal dialysis: Secondary | ICD-10-CM | POA: Diagnosis not present

## 2020-04-02 DIAGNOSIS — D631 Anemia in chronic kidney disease: Secondary | ICD-10-CM | POA: Diagnosis not present

## 2020-04-02 DIAGNOSIS — N186 End stage renal disease: Secondary | ICD-10-CM | POA: Diagnosis not present

## 2020-04-02 DIAGNOSIS — D689 Coagulation defect, unspecified: Secondary | ICD-10-CM | POA: Diagnosis not present

## 2020-04-02 DIAGNOSIS — N2581 Secondary hyperparathyroidism of renal origin: Secondary | ICD-10-CM | POA: Diagnosis not present

## 2020-04-02 DIAGNOSIS — D500 Iron deficiency anemia secondary to blood loss (chronic): Secondary | ICD-10-CM | POA: Diagnosis not present

## 2020-04-05 DIAGNOSIS — D631 Anemia in chronic kidney disease: Secondary | ICD-10-CM | POA: Diagnosis not present

## 2020-04-05 DIAGNOSIS — Z992 Dependence on renal dialysis: Secondary | ICD-10-CM | POA: Diagnosis not present

## 2020-04-05 DIAGNOSIS — N2581 Secondary hyperparathyroidism of renal origin: Secondary | ICD-10-CM | POA: Diagnosis not present

## 2020-04-05 DIAGNOSIS — D689 Coagulation defect, unspecified: Secondary | ICD-10-CM | POA: Diagnosis not present

## 2020-04-05 DIAGNOSIS — D509 Iron deficiency anemia, unspecified: Secondary | ICD-10-CM | POA: Diagnosis not present

## 2020-04-05 DIAGNOSIS — D500 Iron deficiency anemia secondary to blood loss (chronic): Secondary | ICD-10-CM | POA: Diagnosis not present

## 2020-04-05 DIAGNOSIS — N186 End stage renal disease: Secondary | ICD-10-CM | POA: Diagnosis not present

## 2020-04-07 DIAGNOSIS — D689 Coagulation defect, unspecified: Secondary | ICD-10-CM | POA: Diagnosis not present

## 2020-04-07 DIAGNOSIS — D631 Anemia in chronic kidney disease: Secondary | ICD-10-CM | POA: Diagnosis not present

## 2020-04-07 DIAGNOSIS — D509 Iron deficiency anemia, unspecified: Secondary | ICD-10-CM | POA: Diagnosis not present

## 2020-04-07 DIAGNOSIS — N186 End stage renal disease: Secondary | ICD-10-CM | POA: Diagnosis not present

## 2020-04-07 DIAGNOSIS — N2581 Secondary hyperparathyroidism of renal origin: Secondary | ICD-10-CM | POA: Diagnosis not present

## 2020-04-07 DIAGNOSIS — D500 Iron deficiency anemia secondary to blood loss (chronic): Secondary | ICD-10-CM | POA: Diagnosis not present

## 2020-04-07 DIAGNOSIS — Z992 Dependence on renal dialysis: Secondary | ICD-10-CM | POA: Diagnosis not present

## 2020-04-09 DIAGNOSIS — N2581 Secondary hyperparathyroidism of renal origin: Secondary | ICD-10-CM | POA: Diagnosis not present

## 2020-04-09 DIAGNOSIS — D689 Coagulation defect, unspecified: Secondary | ICD-10-CM | POA: Diagnosis not present

## 2020-04-09 DIAGNOSIS — Z992 Dependence on renal dialysis: Secondary | ICD-10-CM | POA: Diagnosis not present

## 2020-04-09 DIAGNOSIS — N186 End stage renal disease: Secondary | ICD-10-CM | POA: Diagnosis not present

## 2020-04-09 DIAGNOSIS — D631 Anemia in chronic kidney disease: Secondary | ICD-10-CM | POA: Diagnosis not present

## 2020-04-09 DIAGNOSIS — D500 Iron deficiency anemia secondary to blood loss (chronic): Secondary | ICD-10-CM | POA: Diagnosis not present

## 2020-04-09 DIAGNOSIS — D509 Iron deficiency anemia, unspecified: Secondary | ICD-10-CM | POA: Diagnosis not present

## 2020-04-12 DIAGNOSIS — N186 End stage renal disease: Secondary | ICD-10-CM | POA: Diagnosis not present

## 2020-04-12 DIAGNOSIS — D689 Coagulation defect, unspecified: Secondary | ICD-10-CM | POA: Diagnosis not present

## 2020-04-12 DIAGNOSIS — D631 Anemia in chronic kidney disease: Secondary | ICD-10-CM | POA: Diagnosis not present

## 2020-04-12 DIAGNOSIS — D500 Iron deficiency anemia secondary to blood loss (chronic): Secondary | ICD-10-CM | POA: Diagnosis not present

## 2020-04-12 DIAGNOSIS — N2581 Secondary hyperparathyroidism of renal origin: Secondary | ICD-10-CM | POA: Diagnosis not present

## 2020-04-12 DIAGNOSIS — D509 Iron deficiency anemia, unspecified: Secondary | ICD-10-CM | POA: Diagnosis not present

## 2020-04-12 DIAGNOSIS — Z992 Dependence on renal dialysis: Secondary | ICD-10-CM | POA: Diagnosis not present

## 2020-04-14 DIAGNOSIS — N186 End stage renal disease: Secondary | ICD-10-CM | POA: Diagnosis not present

## 2020-04-14 DIAGNOSIS — D689 Coagulation defect, unspecified: Secondary | ICD-10-CM | POA: Diagnosis not present

## 2020-04-14 DIAGNOSIS — D500 Iron deficiency anemia secondary to blood loss (chronic): Secondary | ICD-10-CM | POA: Diagnosis not present

## 2020-04-14 DIAGNOSIS — Z992 Dependence on renal dialysis: Secondary | ICD-10-CM | POA: Diagnosis not present

## 2020-04-14 DIAGNOSIS — D509 Iron deficiency anemia, unspecified: Secondary | ICD-10-CM | POA: Diagnosis not present

## 2020-04-14 DIAGNOSIS — N2581 Secondary hyperparathyroidism of renal origin: Secondary | ICD-10-CM | POA: Diagnosis not present

## 2020-04-14 DIAGNOSIS — D631 Anemia in chronic kidney disease: Secondary | ICD-10-CM | POA: Diagnosis not present

## 2020-04-16 DIAGNOSIS — D689 Coagulation defect, unspecified: Secondary | ICD-10-CM | POA: Diagnosis not present

## 2020-04-16 DIAGNOSIS — D500 Iron deficiency anemia secondary to blood loss (chronic): Secondary | ICD-10-CM | POA: Diagnosis not present

## 2020-04-16 DIAGNOSIS — N2581 Secondary hyperparathyroidism of renal origin: Secondary | ICD-10-CM | POA: Diagnosis not present

## 2020-04-16 DIAGNOSIS — D509 Iron deficiency anemia, unspecified: Secondary | ICD-10-CM | POA: Diagnosis not present

## 2020-04-16 DIAGNOSIS — D631 Anemia in chronic kidney disease: Secondary | ICD-10-CM | POA: Diagnosis not present

## 2020-04-16 DIAGNOSIS — Z992 Dependence on renal dialysis: Secondary | ICD-10-CM | POA: Diagnosis not present

## 2020-04-16 DIAGNOSIS — N186 End stage renal disease: Secondary | ICD-10-CM | POA: Diagnosis not present

## 2020-04-19 DIAGNOSIS — D509 Iron deficiency anemia, unspecified: Secondary | ICD-10-CM | POA: Diagnosis not present

## 2020-04-19 DIAGNOSIS — D500 Iron deficiency anemia secondary to blood loss (chronic): Secondary | ICD-10-CM | POA: Diagnosis not present

## 2020-04-19 DIAGNOSIS — D631 Anemia in chronic kidney disease: Secondary | ICD-10-CM | POA: Diagnosis not present

## 2020-04-19 DIAGNOSIS — N186 End stage renal disease: Secondary | ICD-10-CM | POA: Diagnosis not present

## 2020-04-19 DIAGNOSIS — Z992 Dependence on renal dialysis: Secondary | ICD-10-CM | POA: Diagnosis not present

## 2020-04-19 DIAGNOSIS — N2581 Secondary hyperparathyroidism of renal origin: Secondary | ICD-10-CM | POA: Diagnosis not present

## 2020-04-19 DIAGNOSIS — D689 Coagulation defect, unspecified: Secondary | ICD-10-CM | POA: Diagnosis not present

## 2020-04-21 DIAGNOSIS — D500 Iron deficiency anemia secondary to blood loss (chronic): Secondary | ICD-10-CM | POA: Diagnosis not present

## 2020-04-21 DIAGNOSIS — N2581 Secondary hyperparathyroidism of renal origin: Secondary | ICD-10-CM | POA: Diagnosis not present

## 2020-04-21 DIAGNOSIS — D509 Iron deficiency anemia, unspecified: Secondary | ICD-10-CM | POA: Diagnosis not present

## 2020-04-21 DIAGNOSIS — N186 End stage renal disease: Secondary | ICD-10-CM | POA: Diagnosis not present

## 2020-04-21 DIAGNOSIS — D689 Coagulation defect, unspecified: Secondary | ICD-10-CM | POA: Diagnosis not present

## 2020-04-21 DIAGNOSIS — D631 Anemia in chronic kidney disease: Secondary | ICD-10-CM | POA: Diagnosis not present

## 2020-04-21 DIAGNOSIS — Z992 Dependence on renal dialysis: Secondary | ICD-10-CM | POA: Diagnosis not present

## 2020-04-23 DIAGNOSIS — D500 Iron deficiency anemia secondary to blood loss (chronic): Secondary | ICD-10-CM | POA: Diagnosis not present

## 2020-04-23 DIAGNOSIS — N186 End stage renal disease: Secondary | ICD-10-CM | POA: Diagnosis not present

## 2020-04-23 DIAGNOSIS — N2581 Secondary hyperparathyroidism of renal origin: Secondary | ICD-10-CM | POA: Diagnosis not present

## 2020-04-23 DIAGNOSIS — Z992 Dependence on renal dialysis: Secondary | ICD-10-CM | POA: Diagnosis not present

## 2020-04-23 DIAGNOSIS — D689 Coagulation defect, unspecified: Secondary | ICD-10-CM | POA: Diagnosis not present

## 2020-04-23 DIAGNOSIS — D509 Iron deficiency anemia, unspecified: Secondary | ICD-10-CM | POA: Diagnosis not present

## 2020-04-23 DIAGNOSIS — D631 Anemia in chronic kidney disease: Secondary | ICD-10-CM | POA: Diagnosis not present

## 2020-04-26 DIAGNOSIS — Z992 Dependence on renal dialysis: Secondary | ICD-10-CM | POA: Diagnosis not present

## 2020-04-26 DIAGNOSIS — D631 Anemia in chronic kidney disease: Secondary | ICD-10-CM | POA: Diagnosis not present

## 2020-04-26 DIAGNOSIS — D500 Iron deficiency anemia secondary to blood loss (chronic): Secondary | ICD-10-CM | POA: Diagnosis not present

## 2020-04-26 DIAGNOSIS — D689 Coagulation defect, unspecified: Secondary | ICD-10-CM | POA: Diagnosis not present

## 2020-04-26 DIAGNOSIS — N2581 Secondary hyperparathyroidism of renal origin: Secondary | ICD-10-CM | POA: Diagnosis not present

## 2020-04-26 DIAGNOSIS — E1129 Type 2 diabetes mellitus with other diabetic kidney complication: Secondary | ICD-10-CM | POA: Diagnosis not present

## 2020-04-26 DIAGNOSIS — D509 Iron deficiency anemia, unspecified: Secondary | ICD-10-CM | POA: Diagnosis not present

## 2020-04-26 DIAGNOSIS — N186 End stage renal disease: Secondary | ICD-10-CM | POA: Diagnosis not present

## 2020-04-28 DIAGNOSIS — N186 End stage renal disease: Secondary | ICD-10-CM | POA: Diagnosis not present

## 2020-04-28 DIAGNOSIS — D689 Coagulation defect, unspecified: Secondary | ICD-10-CM | POA: Diagnosis not present

## 2020-04-28 DIAGNOSIS — N2581 Secondary hyperparathyroidism of renal origin: Secondary | ICD-10-CM | POA: Diagnosis not present

## 2020-04-28 DIAGNOSIS — D631 Anemia in chronic kidney disease: Secondary | ICD-10-CM | POA: Diagnosis not present

## 2020-04-28 DIAGNOSIS — E118 Type 2 diabetes mellitus with unspecified complications: Secondary | ICD-10-CM | POA: Diagnosis not present

## 2020-04-28 DIAGNOSIS — Z992 Dependence on renal dialysis: Secondary | ICD-10-CM | POA: Diagnosis not present

## 2020-04-28 DIAGNOSIS — D509 Iron deficiency anemia, unspecified: Secondary | ICD-10-CM | POA: Diagnosis not present

## 2020-04-30 DIAGNOSIS — N186 End stage renal disease: Secondary | ICD-10-CM | POA: Diagnosis not present

## 2020-04-30 DIAGNOSIS — E118 Type 2 diabetes mellitus with unspecified complications: Secondary | ICD-10-CM | POA: Diagnosis not present

## 2020-04-30 DIAGNOSIS — D509 Iron deficiency anemia, unspecified: Secondary | ICD-10-CM | POA: Diagnosis not present

## 2020-04-30 DIAGNOSIS — D631 Anemia in chronic kidney disease: Secondary | ICD-10-CM | POA: Diagnosis not present

## 2020-04-30 DIAGNOSIS — N2581 Secondary hyperparathyroidism of renal origin: Secondary | ICD-10-CM | POA: Diagnosis not present

## 2020-04-30 DIAGNOSIS — D689 Coagulation defect, unspecified: Secondary | ICD-10-CM | POA: Diagnosis not present

## 2020-04-30 DIAGNOSIS — Z992 Dependence on renal dialysis: Secondary | ICD-10-CM | POA: Diagnosis not present

## 2020-05-03 DIAGNOSIS — D689 Coagulation defect, unspecified: Secondary | ICD-10-CM | POA: Diagnosis not present

## 2020-05-03 DIAGNOSIS — D631 Anemia in chronic kidney disease: Secondary | ICD-10-CM | POA: Diagnosis not present

## 2020-05-03 DIAGNOSIS — D509 Iron deficiency anemia, unspecified: Secondary | ICD-10-CM | POA: Diagnosis not present

## 2020-05-03 DIAGNOSIS — E118 Type 2 diabetes mellitus with unspecified complications: Secondary | ICD-10-CM | POA: Diagnosis not present

## 2020-05-03 DIAGNOSIS — N2581 Secondary hyperparathyroidism of renal origin: Secondary | ICD-10-CM | POA: Diagnosis not present

## 2020-05-03 DIAGNOSIS — Z992 Dependence on renal dialysis: Secondary | ICD-10-CM | POA: Diagnosis not present

## 2020-05-03 DIAGNOSIS — N186 End stage renal disease: Secondary | ICD-10-CM | POA: Diagnosis not present

## 2020-05-05 DIAGNOSIS — E118 Type 2 diabetes mellitus with unspecified complications: Secondary | ICD-10-CM | POA: Diagnosis not present

## 2020-05-05 DIAGNOSIS — D689 Coagulation defect, unspecified: Secondary | ICD-10-CM | POA: Diagnosis not present

## 2020-05-05 DIAGNOSIS — N2581 Secondary hyperparathyroidism of renal origin: Secondary | ICD-10-CM | POA: Diagnosis not present

## 2020-05-05 DIAGNOSIS — D509 Iron deficiency anemia, unspecified: Secondary | ICD-10-CM | POA: Diagnosis not present

## 2020-05-05 DIAGNOSIS — Z992 Dependence on renal dialysis: Secondary | ICD-10-CM | POA: Diagnosis not present

## 2020-05-05 DIAGNOSIS — N186 End stage renal disease: Secondary | ICD-10-CM | POA: Diagnosis not present

## 2020-05-05 DIAGNOSIS — D631 Anemia in chronic kidney disease: Secondary | ICD-10-CM | POA: Diagnosis not present

## 2020-05-05 NOTE — Progress Notes (Signed)
FOLLOW UP  Assessment:   Essential hypertension At goal; no problems at dialysis Monitor blood pressure at home; call if consistently over 130/80 Continue DASH diet.   Reminder to go to the ER if any CP, SOB, nausea, dizziness, severe HA, changes vision/speech, left arm numbness and tingling and jaw pain  Chronic diastolic heart failure (HCC)       Followed by cardiology; edema resolved since dialysis  Insulin dependent diabetes with renal manifestation (HCC) Insulin 70/30 PRN hyperglycemia 180<; has not needed in past 12+ months Dietary recommendations reviewed  Physical Activity recommendations reviewed -     Hemoglobin A1c  ESRD on dialysis [facility]) Doing well with dialysis after extensive fistula access issues; MWF;  followed by Dr. Joelyn Oms [facility] for renal transplant via [facility] -     CMP/GFR  AV fistula ([place]) Followed by nephrology, vascular PRN  Anemia in chronic kidney disease, on chronic dialysis ([place]) Followed by nephrology -     CBC with Differential/Platelet  Hyperlipidemia associated with T2DM ([place]) Continue medications: crestor 20 mg daily LDL goal <70 Continue low cholesterol diet and exercise.  -     Lipid panel -     TSH  Morbid obesity (BMI 36)  Commended on weight loss progress Long discussion about weight loss, diet, and exercise Recommended diet heavy in fruits and veggies and low in animal meats, cheeses, and dairy products, appropriate calorie intake Discussed appropriate weight for height, initial goal weight of 175lb discussed Follow up 3 months  Pulmonary hypertension ([place]) Monitoring, symptoms improved from previous, stable Pulmonology referral as indicated  Cardiomyopathy, unspecified type ([place]) Followed by cardiology, denies concerning sx, fluids managed by dialysis  Paroxysmal atrial fibrillation ([place]) No longer on anticoagulation; continue follow up with cardiology  Depression in remission ([place]) In remission on medicatoins; Continue  zoloft, buspar  Lifestyle discussed: diet/exerise, sleep hygiene, stress management, hydration  Anemia in chronic kidney disease, on dialysis ([place]) Monitor CBC, managed by nephrology   Secondary renal hyperparathyroidism ([place]) Monitor; managed by nephrology   Over 40 minutes of exam, counseling, chart review and critical decision making was performed Future Appointments  Date Time Provider [place]  05/21/2020 11:45 AM Deberah Pelton, NP [facility]  11/23/2020  2:00 PM Unk Pinto, MD [facility] None    Subjective:  Jody Nguyen is a 64 y.o. female who presents for 3 month follow up.   In 2016, she underwent an elective AoVRw/Anesthesia complications with consequent multi systems failure then transferred to [facility] for ECMO  & Dialysis. She has been on Dialysis treatments 3 x/week, followed by Dr. Joelyn Oms. She is hopeful for a Kidney transplant. She has follow up next month with [facility], trying to lose a bit more weight before then.  BMI is Body mass index is 37.16 kg/m., she is working on diet and exercise. Days she is not doing dialysis, she will do 10 mins in the afternoon and 10 on her way back. On the days of dialysis she will walk 10 mins. She will drink coke, she is down to 12 oz.  Wt Readings from Last 3 Encounters:  05/06/20 184 lb (83.5 kg)  01/29/20 181 lb 9.6 oz (82.4 kg)  11/20/19 179 lb (81.2 kg)   She is followed by cardiology with extensive hx including cath, bypass and minimally invasive mitral valve repair in 2016.   Echocardiogram February 2020 at [facility] showed normal LV function, prior mitral valve repair with mild mitral regurgitation and mean gradient 5 mmHg, mild to  moderate left atrial enlargement, mild right atrial enlargement, moderate tricuspid regurgitation.  Right heart catheterization at [facility] November 2020 showed RA pressure of 9, RV pressure 55/8, PA pressure 55/25 and pulmonary capillary wedge pressure of 18.  Followed at [facility] for pulmonary hypertension as she was being considered for renal transplant.  Reports she is doing well in this past year, denies CP, dyspnea, palpitations, orthopnea, LE edema.   she has a diagnosis of depression in remission on medication and currently remains on zoloft 25 mg, buspar 10 mg BID, reports symptoms are well controlled on current regimen.    Her blood pressure has been controlled at home, today their BP is BP: 128/74 She does workout. She denies chest pain, shortness of breath, dizziness.   She is on cholesterol medication (rosuvastatin 20 mg daily) and denies myalgias. Her cholesterol is at goal. The cholesterol last visit was:   Lab Results  Component Value Date   CHOL 193 01/29/2020   HDL 52 01/29/2020   LDLCALC 115 (H) 01/29/2020   TRIG 138 01/29/2020   CHOLHDL 3.7 01/29/2020    She has been working on diet and exercise for T2 diabetes, insulin PRN, on daily ASA, statin, not on ACE/ARB secondary to ESRD on dialysis, and denies foot ulcerations, hyperglycemia, hypoglycemia , increased appetite, nausea, paresthesia of the feet, polydipsia, polyuria, visual disturbances and vomiting. She takes insulin only as needed for glucose readings above 180. Has not needed in past 3 months. She reports fasting glucose typically 115-120. Last A1C in the office was:  Lab Results  Component Value Date   HGBA1C 6.2 (H) 01/29/2020   She has ESRD on dialysis, gets on MWF. Follows with [name]. Last GFR: Lab Results  Component Value Date   GFRAA 6 (L) 01/29/2020   Patient is on Vitamin D supplement, taking what is administered to on dialysis days by nephrology. She is on sensipar for secondary hyperparathyroidism Lab Results  Component Value Date   VD25OH 36 10/30/2019     Lab Results  Component Value Date   IRON 92 10/09/2018   TIBC 278 10/09/2018   FERRITIN 158 11/30/2014     Medication Review:  Current Outpatient Medications (Endocrine & Metabolic):  .  cinacalcet  (SENSIPAR) 60 MG tablet, Take 1 tablet (60 mg total) by mouth daily. .  insulin aspart protamine - aspart (NOVOLOG MIX 70/30 FLEXPEN) (70-30) 100 UNIT/ML FlexPen, Inject 0.1 mLs (10 Units total) into the skin 2 (two) times daily. (Patient taking differently: Inject 10 Units into the skin 2 (two) times daily as needed (if blood sugar is above 180). )  Current Outpatient Medications (Cardiovascular):  .  carvedilol (COREG) 25 MG tablet, Take 1 tablet 2 x /day for BP & Heart .  hydrALAZINE (APRESOLINE) 25 MG tablet, Take 1 tablet 3 x /day for BP & Heart .  losartan (COZAAR) 50 MG tablet, Take 1 tablet Daily for BP .  rosuvastatin (CRESTOR) 20 MG tablet, Take 1 tablet Daily for Cholesterol .  sildenafil (REVATIO) 20 MG tablet, Take 2 tablets (40 mg) 3 x  /day fr Pulmonary Hypertension  Current Outpatient Medications (Respiratory):  .  azelastine (ASTELIN) 0.1 % nasal spray, Use 1 to 2 sprays each nostril 2 to 3 x / day  Current Outpatient Medications (Analgesics):  .  acetaminophen (TYLENOL) 500 MG tablet, Take 1,000 mg by mouth every 6 (six) hours as needed (for pain.). Marland Kitchen  aspirin EC 81 MG tablet, Take 81 mg by mouth daily.  Current Outpatient Medications (Other):  .  busPIRone (BUSPAR) 10 MG tablet, Take 1 tablet 2 x /day for Anxiety .  latanoprost (XALATAN) 0.005 % ophthalmic solution, Place 1 drop into both eyes daily. .  Multiple Vitamins-Minerals (ZINC PO), Take by mouth. .  ondansetron (ZOFRAN ODT) 4 MG disintegrating tablet, Take 1 tablet (4 mg total) by mouth every 8 (eight) hours as needed for nausea or vomiting. Marland Kitchen  RENVELA 800 MG tablet, Take 1,600-4,000 mg by mouth 3 (three) times daily. 4000 mg (5 caps) with meals and 1600 mg (2 caps) with snacks .  sertraline (ZOLOFT) 25 MG tablet, Take 1 tablet Daily for Mood   Allergies  Allergen Reactions  . Minoxidil Other (See Comments)    Pericardial effusion  . Lipitor [Atorvastatin] Other (See Comments)    MYALGIAS > "pain in  legs" Tolerates rosuvastatin  . Morphine And Related Itching  . Ace Inhibitors Other (See Comments)    REACTION: "not sure...think it made me drowsy all the time"    Current Problems (verified) Patient Active Problem List   Diagnosis Date Noted  . A-V fistula ([place]) 01/29/2020  . Secondary renal hyperparathyroidism ([place]) 07/28/2019  . Major depression in remission ([place]) 01/22/2019  . ESRD on dialysis ([place]) 10/09/2015  . Cardiomyopathy ([place]) 09/10/2015  . Atrial fibrillation ([place]) 09/10/2015  . S/P minimally invasive mitral valve repair 07/08/2015  . Chronic periodontitis 06/30/2015  . Chronic diastolic heart failure ([place])   . Pulmonary hypertension ([place]) 06/18/2015  . Morbid obesity (BMI 36.05)  03/29/2015  . Type 2 diabetes mellitus ([place]) 11/22/2014  . Vitamin D deficiency 03/25/2014  . Anemia in chronic kidney disease   . Essential hypertension 08/17/2011  . Hyperlipidemia associated with type 2 diabetes mellitus ([place]) 08/17/2011    Surgical History: reviewed and unchanged Family History: reviewed and unchanged Social History: reviewed and unchanged   Review of Systems  Constitutional: Negative for malaise/fatigue and weight loss.  HENT: Negative for hearing loss and tinnitus.   Eyes: Negative for blurred vision and double vision.  Respiratory: Negative for cough, shortness of breath and wheezing.   Cardiovascular: Negative for chest pain, palpitations, orthopnea, claudication and leg swelling.  Gastrointestinal: Negative for abdominal pain, blood in stool, constipation, diarrhea, heartburn, melena, nausea and vomiting.  Genitourinary: Negative.   Musculoskeletal: Negative for joint pain and myalgias.  Skin: Negative for rash.  Neurological: Negative for dizziness, tingling, sensory change, weakness and headaches.  Endo/Heme/Allergies: Negative for polydipsia.  Psychiatric/Behavioral: Negative.   All other systems reviewed and are negative.    Objective:      Today's Vitals   05/06/20 1459  BP: 128/74  Pulse: 81  Temp: 97.7 F (36.5 C)  SpO2: 97%  Weight: 184 lb (83.5 kg)   Body mass index is 37.16 kg/m.  General appearance: alert, no distress, WD/WN, female HEENT: normocephalic, sclerae anicteric, TMs pearly, nares patent, no discharge or erythema, pharynx normal Oral cavity: MMM, no lesions Neck: supple, no lymphadenopathy, no thyromegaly, no masses Heart: RRR, normal S1, S2, no murmurs. AV fistula to RUE with scarring.  Lungs: CTA bilaterally, no wheezes, rhonchi, or rales Abdomen: +bs, soft, non tender, non distended, no masses, no hepatomegaly, no splenomegaly Musculoskeletal: nontender, no swelling, no obvious deformity Extremities: no edema, no cyanosis, no clubbing Pulses: 2+ symmetric, upper and lower extremities, normal cap refill Neurological: alert, oriented x 3, CN2-12 intact, strength normal upper extremities and lower extremities, sensation normal throughout, DTRs 2+ throughout, no cerebellar signs, gait normal Psychiatric: normal affect,  behavior normal, pleasant    Vicie Mutters, PA-C   05/06/2020

## 2020-05-06 ENCOUNTER — Other Ambulatory Visit: Payer: Self-pay

## 2020-05-06 ENCOUNTER — Ambulatory Visit (INDEPENDENT_AMBULATORY_CARE_PROVIDER_SITE_OTHER): Payer: Medicare Other | Admitting: Physician Assistant

## 2020-05-06 ENCOUNTER — Encounter: Payer: Self-pay | Admitting: Physician Assistant

## 2020-05-06 VITALS — BP 128/74 | HR 81 | Temp 97.7°F | Wt 184.0 lb

## 2020-05-06 DIAGNOSIS — I272 Pulmonary hypertension, unspecified: Secondary | ICD-10-CM

## 2020-05-06 DIAGNOSIS — D649 Anemia, unspecified: Secondary | ICD-10-CM

## 2020-05-06 DIAGNOSIS — Z992 Dependence on renal dialysis: Secondary | ICD-10-CM

## 2020-05-06 DIAGNOSIS — Z794 Long term (current) use of insulin: Secondary | ICD-10-CM

## 2020-05-06 DIAGNOSIS — I5032 Chronic diastolic (congestive) heart failure: Secondary | ICD-10-CM | POA: Diagnosis not present

## 2020-05-06 DIAGNOSIS — I429 Cardiomyopathy, unspecified: Secondary | ICD-10-CM | POA: Diagnosis not present

## 2020-05-06 DIAGNOSIS — E1122 Type 2 diabetes mellitus with diabetic chronic kidney disease: Secondary | ICD-10-CM

## 2020-05-06 DIAGNOSIS — I770 Arteriovenous fistula, acquired: Secondary | ICD-10-CM

## 2020-05-06 DIAGNOSIS — Z79899 Other long term (current) drug therapy: Secondary | ICD-10-CM

## 2020-05-06 DIAGNOSIS — E1169 Type 2 diabetes mellitus with other specified complication: Secondary | ICD-10-CM

## 2020-05-06 DIAGNOSIS — I480 Paroxysmal atrial fibrillation: Secondary | ICD-10-CM | POA: Diagnosis not present

## 2020-05-06 DIAGNOSIS — N186 End stage renal disease: Secondary | ICD-10-CM | POA: Diagnosis not present

## 2020-05-06 DIAGNOSIS — I10 Essential (primary) hypertension: Secondary | ICD-10-CM | POA: Diagnosis not present

## 2020-05-06 DIAGNOSIS — E785 Hyperlipidemia, unspecified: Secondary | ICD-10-CM

## 2020-05-06 DIAGNOSIS — N2581 Secondary hyperparathyroidism of renal origin: Secondary | ICD-10-CM

## 2020-05-06 DIAGNOSIS — F325 Major depressive disorder, single episode, in full remission: Secondary | ICD-10-CM

## 2020-05-06 NOTE — Patient Instructions (Addendum)
Use a dropper or use a cap to put peroxide, olive oil,mineral oil or canola oil in the effected ear- 2-3 times a week. Let it soak for 20-30 min then you can take a shower or use a baby bulb with warm water to wash out the ear wax.  Can buy debrox wax removal kit over the counter.  Do not use Qtips   General eating tips  What to Avoid  Avoid added sugars o Often added sugar can be found in processed foods such as many condiments, dry cereals, cakes, cookies, chips, crisps, crackers, candies, sweetened drinks, etc.  o Read labels and AVOID/DECREASE use of foods with the following in their ingredient list: Sugar, fructose, high fructose corn syrup, sucrose, glucose, maltose, dextrose, molasses, cane sugar, brown sugar, any type of syrup, agave nectar, etc.    Avoid snacking in between meals- drink water or if you feel you need a snack, pick a high water content snack such as cucumbers, watermelon, or any veggie.   Avoid foods made with flour o If you are going to eat food made with flour, choose those made with whole-grains; and, minimize your consumption as much as is tolerable  Avoid processed foods o These foods are generally stocked in the middle of the grocery store.  o Focus on shopping on the perimeter of the grocery.  What to Include  Vegetables o GREEN LEAFY VEGETABLES: Kale, spinach, mustard greens, collard greens, cabbage, broccoli, etc. o OTHER: Asparagus, cauliflower, eggplant, carrots, peas, Brussel sprouts, tomatoes, bell peppers, zucchini, beets, cucumbers, etc.  Grains, seeds, and legumes o Beans: kidney beans, black eyed peas, garbanzo beans, black beans, pinto beans, etc. o Whole, unrefined grains: brown rice, barley, bulgur, oatmeal, etc.  Healthy fats  o Avoid highly processed fats such as vegetable oil o Examples of healthy fats: avocado, olives, virgin olive oil, dark chocolate (?72% Cocoa), nuts (peanuts, almonds, walnuts, cashews, pecans, etc.) o Please still  do small amount of these healthy fats, they are dense in calories.   Low - Moderate Intake of Animal Sources of Protein o Meat sources: chicken, [place], salmon, tuna. Limit to 4 ounces of meat at one time or the size of your palm. o Consider limiting dairy sources, but when choosing dairy focus on: PLAIN [place] yogurt, cottage cheese, high-protein milk  Fruit o Choose berries

## 2020-05-07 DIAGNOSIS — Z992 Dependence on renal dialysis: Secondary | ICD-10-CM | POA: Diagnosis not present

## 2020-05-07 DIAGNOSIS — E118 Type 2 diabetes mellitus with unspecified complications: Secondary | ICD-10-CM | POA: Diagnosis not present

## 2020-05-07 DIAGNOSIS — D509 Iron deficiency anemia, unspecified: Secondary | ICD-10-CM | POA: Diagnosis not present

## 2020-05-07 DIAGNOSIS — N186 End stage renal disease: Secondary | ICD-10-CM | POA: Diagnosis not present

## 2020-05-07 DIAGNOSIS — D689 Coagulation defect, unspecified: Secondary | ICD-10-CM | POA: Diagnosis not present

## 2020-05-07 DIAGNOSIS — D631 Anemia in chronic kidney disease: Secondary | ICD-10-CM | POA: Diagnosis not present

## 2020-05-07 DIAGNOSIS — N2581 Secondary hyperparathyroidism of renal origin: Secondary | ICD-10-CM | POA: Diagnosis not present

## 2020-05-07 LAB — COMPLETE METABOLIC PANEL WITH GFR
AG Ratio: 1.2 (calc) (ref 1.0–2.5)
ALT: 6 U/L (ref 6–29)
AST: 9 U/L — ABNORMAL LOW (ref 10–35)
Albumin: 4.1 g/dL (ref 3.6–5.1)
Alkaline phosphatase (APISO): 75 U/L (ref 37–153)
BUN/Creatinine Ratio: 6 (calc) (ref 6–22)
BUN: 36 mg/dL — ABNORMAL HIGH (ref 7–25)
CO2: 32 mmol/L (ref 20–32)
Calcium: 9.7 mg/dL (ref 8.6–10.4)
Chloride: 96 mmol/L — ABNORMAL LOW (ref 98–110)
Creat: 6.46 mg/dL — ABNORMAL HIGH (ref 0.50–0.99)
GFR, Est African American: 7 mL/min/{1.73_m2} — ABNORMAL LOW (ref >=60)
GFR, Est Non African American: 6 mL/min/{1.73_m2} — ABNORMAL LOW (ref >=60)
Globulin: 3.3 g/dL (calc) (ref 1.9–3.7)
Glucose, Bld: 140 mg/dL — ABNORMAL HIGH (ref 65–99)
Potassium: 3.5 mmol/L (ref 3.5–5.3)
Sodium: 139 mmol/L (ref 135–146)
Total Bilirubin: 0.5 mg/dL (ref 0.2–1.2)
Total Protein: 7.4 g/dL (ref 6.1–8.1)

## 2020-05-07 LAB — CBC WITH DIFFERENTIAL/PLATELET
Absolute Monocytes: 639 cells/uL (ref 200–950)
Basophils Absolute: 52 cells/uL (ref 0–200)
Basophils Relative: 1.1 %
Eosinophils Absolute: 221 cells/uL (ref 15–500)
Eosinophils Relative: 4.7 %
HCT: 28.4 % — ABNORMAL LOW (ref 35.0–45.0)
Hemoglobin: 9.1 g/dL — ABNORMAL LOW (ref 11.7–15.5)
Lymphs Abs: 1264 cells/uL (ref 850–3900)
MCH: 27.3 pg (ref 27.0–33.0)
MCHC: 32 g/dL (ref 32.0–36.0)
MCV: 85.3 fL (ref 80.0–100.0)
MPV: 12.7 fL — ABNORMAL HIGH (ref 7.5–12.5)
Monocytes Relative: 13.6 %
Neutro Abs: 2524 cells/uL (ref 1500–7800)
Neutrophils Relative %: 53.7 %
Platelets: 167 10*3/uL (ref 140–400)
RBC: 3.33 10*6/uL — ABNORMAL LOW (ref 3.80–5.10)
RDW: 15.4 % — ABNORMAL HIGH (ref 11.0–15.0)
Total Lymphocyte: 26.9 %
WBC: 4.7 10*3/uL (ref 3.8–10.8)

## 2020-05-07 LAB — HEMOGLOBIN A1C
Hgb A1c MFr Bld: 5.7 % of total Hgb — ABNORMAL HIGH (ref <5.7)
Mean Plasma Glucose: 117 (calc)
eAG (mmol/L): 6.5 (calc)

## 2020-05-07 LAB — LIPID PANEL
Cholesterol: 247 mg/dL — ABNORMAL HIGH (ref <200)
HDL: 46 mg/dL — ABNORMAL LOW (ref >=50)
LDL Cholesterol (Calc): 172 mg/dL (calc) — ABNORMAL HIGH
Non-HDL Cholesterol (Calc): 201 mg/dL (calc) — ABNORMAL HIGH (ref <130)
Total CHOL/HDL Ratio: 5.4 (calc) — ABNORMAL HIGH (ref <5.0)
Triglycerides: 144 mg/dL (ref <150)

## 2020-05-07 LAB — TSH: TSH: 2.09 mIU/L (ref 0.40–4.50)

## 2020-05-07 LAB — IRON, TOTAL/TOTAL IRON BINDING CAP
%SAT: 42 % (calc) (ref 16–45)
Iron: 85 ug/dL (ref 45–160)
TIBC: 201 mcg/dL (calc) — ABNORMAL LOW (ref 250–450)

## 2020-05-07 LAB — MAGNESIUM: Magnesium: 2.1 mg/dL (ref 1.5–2.5)

## 2020-05-07 LAB — FERRITIN: Ferritin: 1553 ng/mL — ABNORMAL HIGH (ref 16–288)

## 2020-05-10 DIAGNOSIS — D689 Coagulation defect, unspecified: Secondary | ICD-10-CM | POA: Diagnosis not present

## 2020-05-10 DIAGNOSIS — N186 End stage renal disease: Secondary | ICD-10-CM | POA: Diagnosis not present

## 2020-05-10 DIAGNOSIS — D509 Iron deficiency anemia, unspecified: Secondary | ICD-10-CM | POA: Diagnosis not present

## 2020-05-10 DIAGNOSIS — D631 Anemia in chronic kidney disease: Secondary | ICD-10-CM | POA: Diagnosis not present

## 2020-05-10 DIAGNOSIS — N2581 Secondary hyperparathyroidism of renal origin: Secondary | ICD-10-CM | POA: Diagnosis not present

## 2020-05-10 DIAGNOSIS — E118 Type 2 diabetes mellitus with unspecified complications: Secondary | ICD-10-CM | POA: Diagnosis not present

## 2020-05-10 DIAGNOSIS — Z992 Dependence on renal dialysis: Secondary | ICD-10-CM | POA: Diagnosis not present

## 2020-05-12 DIAGNOSIS — D509 Iron deficiency anemia, unspecified: Secondary | ICD-10-CM | POA: Diagnosis not present

## 2020-05-12 DIAGNOSIS — N186 End stage renal disease: Secondary | ICD-10-CM | POA: Diagnosis not present

## 2020-05-12 DIAGNOSIS — E118 Type 2 diabetes mellitus with unspecified complications: Secondary | ICD-10-CM | POA: Diagnosis not present

## 2020-05-12 DIAGNOSIS — N2581 Secondary hyperparathyroidism of renal origin: Secondary | ICD-10-CM | POA: Diagnosis not present

## 2020-05-12 DIAGNOSIS — D689 Coagulation defect, unspecified: Secondary | ICD-10-CM | POA: Diagnosis not present

## 2020-05-12 DIAGNOSIS — Z992 Dependence on renal dialysis: Secondary | ICD-10-CM | POA: Diagnosis not present

## 2020-05-12 DIAGNOSIS — D631 Anemia in chronic kidney disease: Secondary | ICD-10-CM | POA: Diagnosis not present

## 2020-05-14 DIAGNOSIS — Z992 Dependence on renal dialysis: Secondary | ICD-10-CM | POA: Diagnosis not present

## 2020-05-14 DIAGNOSIS — E118 Type 2 diabetes mellitus with unspecified complications: Secondary | ICD-10-CM | POA: Diagnosis not present

## 2020-05-14 DIAGNOSIS — D631 Anemia in chronic kidney disease: Secondary | ICD-10-CM | POA: Diagnosis not present

## 2020-05-14 DIAGNOSIS — D689 Coagulation defect, unspecified: Secondary | ICD-10-CM | POA: Diagnosis not present

## 2020-05-14 DIAGNOSIS — N2581 Secondary hyperparathyroidism of renal origin: Secondary | ICD-10-CM | POA: Diagnosis not present

## 2020-05-14 DIAGNOSIS — N186 End stage renal disease: Secondary | ICD-10-CM | POA: Diagnosis not present

## 2020-05-14 DIAGNOSIS — D509 Iron deficiency anemia, unspecified: Secondary | ICD-10-CM | POA: Diagnosis not present

## 2020-05-17 DIAGNOSIS — D509 Iron deficiency anemia, unspecified: Secondary | ICD-10-CM | POA: Diagnosis not present

## 2020-05-17 DIAGNOSIS — N2581 Secondary hyperparathyroidism of renal origin: Secondary | ICD-10-CM | POA: Diagnosis not present

## 2020-05-17 DIAGNOSIS — D689 Coagulation defect, unspecified: Secondary | ICD-10-CM | POA: Diagnosis not present

## 2020-05-17 DIAGNOSIS — D631 Anemia in chronic kidney disease: Secondary | ICD-10-CM | POA: Diagnosis not present

## 2020-05-17 DIAGNOSIS — E118 Type 2 diabetes mellitus with unspecified complications: Secondary | ICD-10-CM | POA: Diagnosis not present

## 2020-05-17 DIAGNOSIS — Z992 Dependence on renal dialysis: Secondary | ICD-10-CM | POA: Diagnosis not present

## 2020-05-17 DIAGNOSIS — N186 End stage renal disease: Secondary | ICD-10-CM | POA: Diagnosis not present

## 2020-05-19 DIAGNOSIS — N186 End stage renal disease: Secondary | ICD-10-CM | POA: Diagnosis not present

## 2020-05-19 DIAGNOSIS — D509 Iron deficiency anemia, unspecified: Secondary | ICD-10-CM | POA: Diagnosis not present

## 2020-05-19 DIAGNOSIS — Z992 Dependence on renal dialysis: Secondary | ICD-10-CM | POA: Diagnosis not present

## 2020-05-19 DIAGNOSIS — D689 Coagulation defect, unspecified: Secondary | ICD-10-CM | POA: Diagnosis not present

## 2020-05-19 DIAGNOSIS — E118 Type 2 diabetes mellitus with unspecified complications: Secondary | ICD-10-CM | POA: Diagnosis not present

## 2020-05-19 DIAGNOSIS — N2581 Secondary hyperparathyroidism of renal origin: Secondary | ICD-10-CM | POA: Diagnosis not present

## 2020-05-19 DIAGNOSIS — D631 Anemia in chronic kidney disease: Secondary | ICD-10-CM | POA: Diagnosis not present

## 2020-05-20 ENCOUNTER — Other Ambulatory Visit: Payer: Self-pay | Admitting: Internal Medicine

## 2020-05-20 DIAGNOSIS — E1122 Type 2 diabetes mellitus with diabetic chronic kidney disease: Secondary | ICD-10-CM

## 2020-05-20 DIAGNOSIS — I5032 Chronic diastolic (congestive) heart failure: Secondary | ICD-10-CM

## 2020-05-20 DIAGNOSIS — I480 Paroxysmal atrial fibrillation: Secondary | ICD-10-CM

## 2020-05-20 DIAGNOSIS — Z794 Long term (current) use of insulin: Secondary | ICD-10-CM

## 2020-05-20 NOTE — Progress Notes (Signed)
Cardiology Clinic Note   Patient Name: Jody Nguyen Date of Encounter: 05/21/2020  Primary Care Provider:  Unk Pinto, MD Primary Cardiologist:  [name], MD  Patient Profile    Jody Nguyen 64 year old female presents the clinic today for follow-up of her hypertension, diastolic CHF, and mitral valve repair.  Past Medical History    Past Medical History:  Diagnosis Date  . Anemia   . Chronic diastolic CHF (congestive heart failure) ([place])   . Chronic diastolic heart failure ([place])   . CKD (chronic kidney disease) stage 4, GFR 15-29 ml/min (HCC)    dialysis M/W/F  . Constipation   . CVA (cerebral infarction) 1997   no residual deficit  . Diverticulitis   . DM (diabetes mellitus) ([place])    type 2  . Heart murmur   . History of cardiovascular stress test    a. Myoview Oct 2012 showed EF 49%, no ischemia, LVE  . HLD (hyperlipidemia)    takes Crestor daily  . Hypertension   . Pericardial effusion    chronic; felt to be poss related to minoxidil >> DC'd  . Pulmonary hypertension ([place]) 06/18/2015  . Respiratory failure, acute hypoxic, post-operative 07/08/2015   Requiring ECMO support  . S/P cardiac catheterization    a. R/L HC 06/18/15:  mLAD 30%; severe pulmo HTN with PA sat 43%, CI 1.86, prominent V waves indicative of MR; resting hypoxemia O2 sat 86% on RA  . S/P minimally invasive mitral valve repair 07/08/2015   Complex valvuloplasty including artificial Gore-tex neochord placement x10 and 26 mm Sorin Memo 3D Rechord ring annuloplasty via right mini thoracotomy approach  . Severe mitral regurgitation   . Shortness of breath dyspnea   . Stroke ([place]) 1997   no residual effect  . Vitamin D deficiency   . Wears glasses    Past Surgical History:  Procedure Laterality Date  . A/V FISTULAGRAM Right 08/16/2017   Procedure: A/V Fistulagram;  Surgeon: Conrad [place], MD;  Location: [place] [facility];  Service: Cardiovascular;  Laterality: Right;  . AV FISTULA  PLACEMENT Left 10/22/2015   Procedure: RADIOCEPHALIC ARTERIOVENOUS (AV) FISTULA CREATION;  Surgeon: Serafina Mitchell, MD;  Location: [place];  Service: Vascular;  Laterality: Left;  . AV FISTULA PLACEMENT Right 05/22/2017   Procedure: ARTERIOVENOUS (AV) FISTULA CREATION-RIGHT ARM;  Surgeon: Waynetta Sandy, MD;  Location: [place];  Service: Vascular;  Laterality: Right;  . CANNULATION FOR CARDIOPULMONARY BYPASS N/A 07/08/2015   Procedure: CANNULATION FOR ECMO;  Surgeon: Rexene Alberts, MD;  Location: [place];  Service: Open Heart Surgery;  Laterality: N/A;  . CARDIAC CATHETERIZATION    . CARDIAC CATHETERIZATION N/A 06/18/2015   Procedure: Right/Left Heart Cath and Coronary Angiography;  Surgeon: Jettie Booze, MD;  Location: [place] [facility];  Service: Cardiovascular;  Laterality: N/A;  . CESAREAN SECTION     x 2  . COLONOSCOPY    . EXCHANGE OF A DIALYSIS CATHETER N/A 06/28/2017   Procedure: EXCHANGE OF A DIALYSIS CATHETER;  Surgeon: Waynetta Sandy, MD;  Location: [place];  Service: Vascular;  Laterality: N/A;  . FISTULA SUPERFICIALIZATION Left 12/28/2015   Procedure: SUPERFICIALIZATION LEFT RADIOCEPHALIC FISTULA;  Surgeon: Serafina Mitchell, MD;  Location: [place];  Service: Vascular;  Laterality: Left;  . FISTULA SUPERFICIALIZATION Right 08/21/2017   Procedure: FISTULA SUPERFICIALIZATION RIGHT ARM;  Surgeon: Waynetta Sandy, MD;  Location: [place];  Service: Vascular;  Laterality: Right;  . INSERTION OF DIALYSIS CATHETER  Right 04/28/2017   Procedure: INSERTION OF DIALYSIS CATHETER-RIGHT INTERNAL JUGULAR PLACEMENT;  Surgeon: Elam Dutch, MD;  Location: [place];  Service: Vascular;  Laterality: Right;  . LIGATION OF ARTERIOVENOUS  FISTULA Left 04/28/2017   Procedure: LIGATION OF ARTERIOVENOUS  FISTULA;  Surgeon: Elam Dutch, MD;  Location: [place];  Service: Vascular;  Laterality: Left;  . MITRAL VALVE REPAIR Right 07/08/2015   Procedure: MINIMALLY INVASIVE MITRAL VALVE  REPAIR with a 26 Sorin Memo 3D Rechord;  Surgeon: Rexene Alberts, MD;  Location: [place];  Service: Open Heart Surgery;  Laterality: Right;  . MULTIPLE EXTRACTIONS WITH ALVEOLOPLASTY N/A 06/30/2015   Procedure: MULTIPLE EXTRACTIONS OF TOOTH #'S 4  AND 30 WITH ALVEOLOPLASTY AND GROSS DEBRIDEMENT  OF REMAINING TEETH;  Surgeon: Lenn Cal, DDS;  Location: [facility];  Service: Oral Surgery;  Laterality: N/A;  . PERIPHERAL VASCULAR CATHETERIZATION Left 03/28/2016   Procedure: A/V Fistulagram;  Surgeon: Serafina Mitchell, MD;  Location: [facility];  Service: Cardiovascular;  Laterality: Left;  lower arm  . TEE WITHOUT CARDIOVERSION N/A 06/08/2015   Procedure: TRANSESOPHAGEAL ECHOCARDIOGRAM (TEE);  Surgeon: Lelon Perla, MD;  Location: [facility];  Service: Cardiovascular;  Laterality: N/A;  . TEE WITHOUT CARDIOVERSION N/A 07/08/2015   Procedure: TRANSESOPHAGEAL ECHOCARDIOGRAM (TEE);  Surgeon: Rexene Alberts, MD;  Location: [place];  Service: Open Heart Surgery;  Laterality: N/A;    Allergies  Allergies  Allergen Reactions  . Minoxidil Other (See Comments)    Pericardial effusion  . Lipitor [Atorvastatin] Other (See Comments)    MYALGIAS > "pain in legs" Tolerates rosuvastatin  . Morphine And Related Itching  . Ace Inhibitors Other (See Comments)    REACTION: "not sure...think it made me drowsy all the time"    History of Present Illness    Jody Nguyen has a PMH of essential hypertension, chronic diastolic CHF, pulmonary hypertension, cardiomyopathy, atrial fibrillation, hyperlipidemia, type 2 diabetes, ESRD on HD, major depression, status post minimally invasive mitral valve repair.  She had renal Dopplers 11/13 which showed no renal artery stenosis.  She was noted to have previous pericardial effusion with minoxidil.  A TEE 7/16 showed normal LV function, moderate to severe left atrial enlargement, small pericardial effusion, flail segment of anterior mitral valve leaflet with severe  mitral regurgitation and mild tricuspid regurgitation.  Cardiac cath 9/16 showed nonobstructive CAD and severely elevated pulmonary pressures.  Carotid Dopplers 8/16 showed 20-39% bilateral stenosis.  She underwent mitral valve repair 8/16.  Her course was complicated by acute hypoxic respiratory failure refractory to conventional ventilatory support.  She was transitioned to [facility] for ECMO.  Her course was complicated by tracheostomy placement.  During that time she also developed acute on chronic renal failure and is now on HD.  Nuclear stress test 1/20 showed an ejection fraction of 57% and no scar or ischemia.  Echocardiogram 2/20 at [facility] showed normal LV function, prior mitral valve repair with mild mitral regurgitation and mean gradient 5 mmHg, mild to moderate left atrial enlargement, mild right atrial enlargement, moderate tricuspid regurgitation.  A right heart catheterization [facility] 11/20 showed RA pressure of 9, RV pressure 55/8 PA pressure 50//25 and pulmonary wedge pressure of 18.  She is followed by [facility] for pulmonary hypertension and is on renal transplant list.  She was last seen by Dr. Stanford Breed on 11/20/2019.  During that time she denied dyspnea, chest pain, palpitations, and syncope.  She  had no lower extremity edema.  It was noted that her blood pressure had been elevated and her carvedilol was increased to 25 mg twice daily.  She presents the clinic today for follow-up evaluation and states she feels well.  She has been compliant with her dialysis Monday Wednesday and Friday.  She stays physically active walking 3 days/week for at least 30 minutes.  She is also following a heart healthy low-sodium diet.  She states that she is going back to [facility] in August for follow-up appointment and continues to be on the renal transplant list.  I will give her the salty 6 diet sheet, have her maintain her physical activity,  and have her follow-up with Dr. Stanford Breed in 6 months.  I will also order a repeat lipid panel.  Today she denies chest pain, shortness of breath, lower extremity edema, fatigue, palpitations, melena, hematuria, hemoptysis, diaphoresis, weakness, presyncope, syncope, orthopnea, and PND.   Home Medications    Prior to Admission medications   Medication Sig Start Date End Date Taking? Authorizing Provider  acetaminophen (TYLENOL) 500 MG tablet Take 1,000 mg by mouth every 6 (six) hours as needed (for pain.).    [provider]  aspirin EC 81 MG tablet Take 81 mg by mouth daily.    [provider]  azelastine (ASTELIN) 0.1 % nasal spray Use 1 to 2 sprays each nostril 2 to 3 x / day 03/26/18   Unk Pinto, MD  busPIRone (BUSPAR) 10 MG tablet Take 1 tablet 2 x /day for Anxiety 03/19/20   Unk Pinto, MD  carvedilol (COREG) 25 MG tablet Take 1 tablet 2 x /day for BP & Heart 03/19/20   Unk Pinto, MD  cinacalcet (SENSIPAR) 60 MG tablet Take 1 tablet (60 mg total) by mouth daily. 09/11/17   Unk Pinto, MD  hydrALAZINE (APRESOLINE) 25 MG tablet Take 1 tablet 3 x /day for BP & Heart 03/19/20   Unk Pinto, MD  insulin aspart protamine - aspart (NOVOLOG MIX 70/30 FLEXPEN) (70-30) 100 UNIT/ML FlexPen Inject 0.1 mLs (10 Units total) into the skin 2 (two) times daily. Patient taking differently: Inject 10 Units into the skin 2 (two) times daily as needed (if blood sugar is above 180).  03/06/17   Unk Pinto, MD  latanoprost (XALATAN) 0.005 % ophthalmic solution Place 1 drop into both eyes daily. 09/16/18   [provider]  losartan (COZAAR) 50 MG tablet Take 1 tablet Daily for BP 03/19/20   Unk Pinto, MD  Multiple Vitamins-Minerals (ZINC PO) Take by mouth.    [provider]  ondansetron (ZOFRAN ODT) 4 MG disintegrating tablet Take 1 tablet (4 mg total) by mouth every 8 (eight) hours as needed for nausea or vomiting. 02/23/19   Nils Flack, Mina A,  PA-C  RENVELA 800 MG tablet Take 1,600-4,000 mg by mouth 3 (three) times daily. 4000 mg (5 caps) with meals and 1600 mg (2 caps) with snacks 12/21/15   [provider]  rosuvastatin (CRESTOR) 20 MG tablet Take 1 tablet Daily for Cholesterol 03/19/20   Unk Pinto, MD  sertraline (ZOLOFT) 25 MG tablet Take 1 tablet Daily for Mood 03/19/20   Unk Pinto, MD  sildenafil (REVATIO) 20 MG tablet Take 2 tablets (40 mg) 3 x  /day fr Pulmonary Hypertension 03/26/20   Unk Pinto, MD    Family History    Family History  Problem Relation Age of Onset  . Cancer Sister        breast cancer  .  Stroke Mother   . Heart attack Brother        MI in his 60s  . Heart disease Brother        before age 72  . Breast cancer Sister    She indicated that her mother is deceased. She indicated that the status of her father is unknown and reported the following: unknown. She indicated that one of her two sisters is deceased. She indicated that the status of her brother is unknown. She indicated that her maternal grandmother is deceased. She indicated that her maternal grandfather is deceased. She indicated that her paternal grandmother is deceased. She indicated that her paternal grandfather is deceased.  Social History    Social History   Socioeconomic History  . Marital status: Married    Spouse name: Not on file  . Number of children: 3  . Years of education: Not on file  . Highest education level: Not on file  Occupational History    Comment: Unemployed; used to work as a Radio broadcast assistant  Tobacco Use  . Smoking status: Never Smoker  . Smokeless tobacco: Never Used  Vaping Use  . Vaping Use: Never used  Substance and Sexual Activity  . Alcohol use: No    Alcohol/week: 0.0 standard drinks  . Drug use: No  . Sexual activity: Not on file  Other Topics Concern  . Not on file  Social History Narrative  . Not on file   Social Determinants of Health   Financial  Resource Strain:   . Difficulty of Paying Living Expenses:   Food Insecurity:   . Worried About Charity fundraiser in the Last Year:   . Arboriculturist in the Last Year:   Transportation Needs:   . Film/video editor (Medical):   Marland Kitchen Lack of Transportation (Non-Medical):   Physical Activity:   . Days of Exercise per Week:   . Minutes of Exercise per Session:   Stress:   . Feeling of Stress :   Social Connections:   . Frequency of Communication with Friends and Family:   . Frequency of Social Gatherings with Friends and Family:   . Attends Religious Services:   . Active Member of Clubs or Organizations:   . Attends Archivist Meetings:   Marland Kitchen Marital Status:   Intimate Partner Violence:   . Fear of Current or Ex-Partner:   . Emotionally Abused:   Marland Kitchen Physically Abused:   . Sexually Abused:      Review of Systems    General:  No chills, fever, night sweats or weight changes.  Cardiovascular:  No chest pain, dyspnea on exertion, edema, orthopnea, palpitations, paroxysmal nocturnal dyspnea. Dermatological: No rash, lesions/masses Respiratory: No cough, dyspnea Urologic: No hematuria, dysuria Abdominal:   No nausea, vomiting, diarrhea, bright red blood per rectum, melena, or hematemesis Neurologic:  No visual changes, wkns, changes in mental status. All other systems reviewed and are otherwise negative except as noted above.  Physical Exam    VS:  BP 130/86   Pulse 82   Temp (!) 96.8 F (36 C)   Ht 4\' 11"  (1.499 m)   Wt 179 lb 3.2 oz (81.3 kg)   SpO2 98%   BMI 36.19 kg/m  , BMI Body mass index is 36.19 kg/m. GEN: Well nourished, well developed, in no acute distress. HEENT: normal. Neck: Supple, no JVD, carotid bruits, or masses. Cardiac: RRR, no murmurs, rubs, or gallops. No clubbing, cyanosis, edema.  Radials/DP/PT  2+ and equal bilaterally.  Respiratory:  Respirations regular and unlabored, clear to auscultation bilaterally. GI: Soft, nontender,  nondistended, BS + x 4. MS: no deformity or atrophy. Skin: warm and dry, no rash. Neuro:  Strength and sensation are intact. Psych: Normal affect.  Accessory Clinical Findings    ECG personally reviewed by me today-no EKG today.  EKG 10/30/2019 Normal sinus rhythm first-degree AV block 100 bpm  Echocardiogram 08/17/2016 Study Conclusions   - Left ventricle: The cavity size was normal. Wall thickness was  increased in a pattern of moderate LVH. Systolic function was  normal. The estimated ejection fraction was in the range of 55%  to 60%. Doppler parameters are consistent with abnormal left  ventricular relaxation (grade 1 diastolic dysfunction). Doppler  parameters are consistent with high ventricular filling pressure.  - Mitral valve: MV is thckened with some restricted motion Chordae  are thickened, fibrotic Difficult to see with annular  calcification Peak and mean gradients through the valve are 12  and 5 mm Hg respectively MVA by PT1/2 is 2.37 cm2 AVlave area by  continuty equestion is 2 cm2. Moderately to severely calcified  annulus. Moderately thickened leaflets . Valve area by continuity  equation (using LVOT flow): 2 cm^2.  - Left atrium: The atrium was mildly dilated.  - Right ventricle: Systolic function was mildly reduced.  - Pulmonary arteries: PA peak pressure: 40 mm Hg (S).   Assessment & Plan   1.  History of mitral valve repair-no increased DOE or activity intolerance.  Echocardiogram 9/17 showed normal EF, G1 DD, mild mitral regurgitation and no significant mitral stenosis. Continue SBE prophylaxis  Pulmonary hypertension-no increased work of breathing or DOE.  Followed by [facility] Continue HD Heart healthy renal low-sodium diet Increase physical activity as tolerated  Chronic diastolic CHF-euvolemic today.  On HD Heart healthy low-sodium diet Increase physical activity as tolerated Continue HD  Essential  hypertension-BP today 130/86.  Well-controlled at home Continue carvedilol Heart healthy low-sodium diet no salty 6 given Increase physical activity as tolerated  Hyperlipidemia-LDL 172 on 05/06/2020 Continue rosuvastatin Heart healthy low-sodium high-fiber diet Increase physical activity as tolerated Repeat lipid panel in 8 weeks.  Lipid panel ordered for fasting lipids.  Coronary artery disease-no chest pain today. Continue aspirin, rosuvastatin Heart healthy low-sodium diet Increase physical activity as tolerated  Disposition: Follow-up with Dr. Stanford Breed in 6 months.   Jossie Ng. Stephane Niemann NP-C    05/21/2020, 11:59 AM [place] [address] Office [phone] Fax [phone]

## 2020-05-21 ENCOUNTER — Other Ambulatory Visit: Payer: Self-pay

## 2020-05-21 ENCOUNTER — Ambulatory Visit: Payer: Medicare Other | Admitting: General Practice

## 2020-05-21 ENCOUNTER — Encounter: Payer: Self-pay | Admitting: General Practice

## 2020-05-21 VITALS — BP 130/86 | HR 82 | Temp 96.8°F | Ht 59.0 in | Wt 179.2 lb

## 2020-05-21 DIAGNOSIS — Z9889 Other specified postprocedural states: Secondary | ICD-10-CM

## 2020-05-21 DIAGNOSIS — D631 Anemia in chronic kidney disease: Secondary | ICD-10-CM | POA: Diagnosis not present

## 2020-05-21 DIAGNOSIS — E7800 Pure hypercholesterolemia, unspecified: Secondary | ICD-10-CM | POA: Diagnosis not present

## 2020-05-21 DIAGNOSIS — Z992 Dependence on renal dialysis: Secondary | ICD-10-CM | POA: Diagnosis not present

## 2020-05-21 DIAGNOSIS — E118 Type 2 diabetes mellitus with unspecified complications: Secondary | ICD-10-CM | POA: Diagnosis not present

## 2020-05-21 DIAGNOSIS — N186 End stage renal disease: Secondary | ICD-10-CM | POA: Diagnosis not present

## 2020-05-21 DIAGNOSIS — I10 Essential (primary) hypertension: Secondary | ICD-10-CM

## 2020-05-21 DIAGNOSIS — I5032 Chronic diastolic (congestive) heart failure: Secondary | ICD-10-CM | POA: Diagnosis not present

## 2020-05-21 DIAGNOSIS — D689 Coagulation defect, unspecified: Secondary | ICD-10-CM | POA: Diagnosis not present

## 2020-05-21 DIAGNOSIS — I272 Pulmonary hypertension, unspecified: Secondary | ICD-10-CM | POA: Diagnosis not present

## 2020-05-21 DIAGNOSIS — D509 Iron deficiency anemia, unspecified: Secondary | ICD-10-CM | POA: Diagnosis not present

## 2020-05-21 DIAGNOSIS — Z79899 Other long term (current) drug therapy: Secondary | ICD-10-CM

## 2020-05-21 DIAGNOSIS — N2581 Secondary hyperparathyroidism of renal origin: Secondary | ICD-10-CM | POA: Diagnosis not present

## 2020-05-21 NOTE — Patient Instructions (Signed)
Medication Instructions:  The current medical regimen is effective;  continue present plan and medications as directed. Please refer to the Current Medication list given to you today. *If you need a refill on your cardiac medications before your next appointment, please call your pharmacy*  Lab Work: FASTING LIPID PANEL IN 1-2 WEEKS If you have labs (blood work) drawn today and your tests are completely normal, you will receive your results only by:  [place] (if you have MyChart) OR A paper copy in the mail.  If you have any lab test that is abnormal or we need to change your treatment, we will call you to review the results. You may go to any [facility] that is convenient for you however, we do have a lab in our office that is able to assist you. You DO NOT need an appointment for our lab. The lab is open 8:00am and closes at 4:00pm. Lunch 12:45 - 1:45pm.  Special Instructions INCREASE PHYSICAL ACTIVITY AS TOLERATED  PLEASE READ AND FOLLOW SALTY 6-ATTACHED  Follow-Up: Your next appointment:  6 month(s) In Person with [name], MD  At [facility], you and your health needs are our priority.  As part of our continuing mission to provide you with exceptional heart care, we have created designated Provider Care Teams.  These Care Teams include your primary Cardiologist (physician) and Advanced Practice Providers (APPs -  Physician Assistants and Nurse Practitioners) who all work together to provide you with the care you need, when you need it.  We recommend signing up for the patient portal called "MyChart".  Sign up information is provided on this After Visit Summary.  MyChart is used to connect with patients for Virtual Visits (Telemedicine).  Patients are able to view lab/test results, encounter notes, upcoming appointments, etc.  Non-urgent messages can be sent to your provider as well.   To learn more about what you can do with MyChart, go to [email or web address].

## 2020-05-24 DIAGNOSIS — N2581 Secondary hyperparathyroidism of renal origin: Secondary | ICD-10-CM | POA: Diagnosis not present

## 2020-05-24 DIAGNOSIS — D509 Iron deficiency anemia, unspecified: Secondary | ICD-10-CM | POA: Diagnosis not present

## 2020-05-24 DIAGNOSIS — D631 Anemia in chronic kidney disease: Secondary | ICD-10-CM | POA: Diagnosis not present

## 2020-05-24 DIAGNOSIS — Z992 Dependence on renal dialysis: Secondary | ICD-10-CM | POA: Diagnosis not present

## 2020-05-24 DIAGNOSIS — E118 Type 2 diabetes mellitus with unspecified complications: Secondary | ICD-10-CM | POA: Diagnosis not present

## 2020-05-24 DIAGNOSIS — D689 Coagulation defect, unspecified: Secondary | ICD-10-CM | POA: Diagnosis not present

## 2020-05-24 DIAGNOSIS — N186 End stage renal disease: Secondary | ICD-10-CM | POA: Diagnosis not present

## 2020-05-25 DIAGNOSIS — H40013 Open angle with borderline findings, low risk, bilateral: Secondary | ICD-10-CM | POA: Diagnosis not present

## 2020-05-26 DIAGNOSIS — D689 Coagulation defect, unspecified: Secondary | ICD-10-CM | POA: Diagnosis not present

## 2020-05-26 DIAGNOSIS — N186 End stage renal disease: Secondary | ICD-10-CM | POA: Diagnosis not present

## 2020-05-26 DIAGNOSIS — E118 Type 2 diabetes mellitus with unspecified complications: Secondary | ICD-10-CM | POA: Diagnosis not present

## 2020-05-26 DIAGNOSIS — D631 Anemia in chronic kidney disease: Secondary | ICD-10-CM | POA: Diagnosis not present

## 2020-05-26 DIAGNOSIS — E1129 Type 2 diabetes mellitus with other diabetic kidney complication: Secondary | ICD-10-CM | POA: Diagnosis not present

## 2020-05-26 DIAGNOSIS — N2581 Secondary hyperparathyroidism of renal origin: Secondary | ICD-10-CM | POA: Diagnosis not present

## 2020-05-26 DIAGNOSIS — Z992 Dependence on renal dialysis: Secondary | ICD-10-CM | POA: Diagnosis not present

## 2020-05-26 DIAGNOSIS — D509 Iron deficiency anemia, unspecified: Secondary | ICD-10-CM | POA: Diagnosis not present

## 2020-05-28 DIAGNOSIS — D509 Iron deficiency anemia, unspecified: Secondary | ICD-10-CM | POA: Diagnosis not present

## 2020-05-28 DIAGNOSIS — D631 Anemia in chronic kidney disease: Secondary | ICD-10-CM | POA: Diagnosis not present

## 2020-05-28 DIAGNOSIS — N2581 Secondary hyperparathyroidism of renal origin: Secondary | ICD-10-CM | POA: Diagnosis not present

## 2020-05-28 DIAGNOSIS — D689 Coagulation defect, unspecified: Secondary | ICD-10-CM | POA: Diagnosis not present

## 2020-05-28 DIAGNOSIS — E118 Type 2 diabetes mellitus with unspecified complications: Secondary | ICD-10-CM | POA: Diagnosis not present

## 2020-05-28 DIAGNOSIS — Z992 Dependence on renal dialysis: Secondary | ICD-10-CM | POA: Diagnosis not present

## 2020-05-28 DIAGNOSIS — N186 End stage renal disease: Secondary | ICD-10-CM | POA: Diagnosis not present

## 2020-05-31 DIAGNOSIS — Z992 Dependence on renal dialysis: Secondary | ICD-10-CM | POA: Diagnosis not present

## 2020-05-31 DIAGNOSIS — D509 Iron deficiency anemia, unspecified: Secondary | ICD-10-CM | POA: Diagnosis not present

## 2020-05-31 DIAGNOSIS — D689 Coagulation defect, unspecified: Secondary | ICD-10-CM | POA: Diagnosis not present

## 2020-05-31 DIAGNOSIS — E118 Type 2 diabetes mellitus with unspecified complications: Secondary | ICD-10-CM | POA: Diagnosis not present

## 2020-05-31 DIAGNOSIS — D631 Anemia in chronic kidney disease: Secondary | ICD-10-CM | POA: Diagnosis not present

## 2020-05-31 DIAGNOSIS — N2581 Secondary hyperparathyroidism of renal origin: Secondary | ICD-10-CM | POA: Diagnosis not present

## 2020-05-31 DIAGNOSIS — N186 End stage renal disease: Secondary | ICD-10-CM | POA: Diagnosis not present

## 2020-06-02 DIAGNOSIS — Z992 Dependence on renal dialysis: Secondary | ICD-10-CM | POA: Diagnosis not present

## 2020-06-02 DIAGNOSIS — N186 End stage renal disease: Secondary | ICD-10-CM | POA: Diagnosis not present

## 2020-06-02 DIAGNOSIS — D631 Anemia in chronic kidney disease: Secondary | ICD-10-CM | POA: Diagnosis not present

## 2020-06-02 DIAGNOSIS — D509 Iron deficiency anemia, unspecified: Secondary | ICD-10-CM | POA: Diagnosis not present

## 2020-06-02 DIAGNOSIS — E118 Type 2 diabetes mellitus with unspecified complications: Secondary | ICD-10-CM | POA: Diagnosis not present

## 2020-06-02 DIAGNOSIS — N2581 Secondary hyperparathyroidism of renal origin: Secondary | ICD-10-CM | POA: Diagnosis not present

## 2020-06-02 DIAGNOSIS — D689 Coagulation defect, unspecified: Secondary | ICD-10-CM | POA: Diagnosis not present

## 2020-06-04 DIAGNOSIS — D689 Coagulation defect, unspecified: Secondary | ICD-10-CM | POA: Diagnosis not present

## 2020-06-04 DIAGNOSIS — E118 Type 2 diabetes mellitus with unspecified complications: Secondary | ICD-10-CM | POA: Diagnosis not present

## 2020-06-04 DIAGNOSIS — D509 Iron deficiency anemia, unspecified: Secondary | ICD-10-CM | POA: Diagnosis not present

## 2020-06-04 DIAGNOSIS — N186 End stage renal disease: Secondary | ICD-10-CM | POA: Diagnosis not present

## 2020-06-04 DIAGNOSIS — N2581 Secondary hyperparathyroidism of renal origin: Secondary | ICD-10-CM | POA: Diagnosis not present

## 2020-06-04 DIAGNOSIS — Z992 Dependence on renal dialysis: Secondary | ICD-10-CM | POA: Diagnosis not present

## 2020-06-04 DIAGNOSIS — D631 Anemia in chronic kidney disease: Secondary | ICD-10-CM | POA: Diagnosis not present

## 2020-06-07 DIAGNOSIS — E118 Type 2 diabetes mellitus with unspecified complications: Secondary | ICD-10-CM | POA: Diagnosis not present

## 2020-06-07 DIAGNOSIS — N2581 Secondary hyperparathyroidism of renal origin: Secondary | ICD-10-CM | POA: Diagnosis not present

## 2020-06-07 DIAGNOSIS — D631 Anemia in chronic kidney disease: Secondary | ICD-10-CM | POA: Diagnosis not present

## 2020-06-07 DIAGNOSIS — Z992 Dependence on renal dialysis: Secondary | ICD-10-CM | POA: Diagnosis not present

## 2020-06-07 DIAGNOSIS — D509 Iron deficiency anemia, unspecified: Secondary | ICD-10-CM | POA: Diagnosis not present

## 2020-06-07 DIAGNOSIS — D689 Coagulation defect, unspecified: Secondary | ICD-10-CM | POA: Diagnosis not present

## 2020-06-07 DIAGNOSIS — N186 End stage renal disease: Secondary | ICD-10-CM | POA: Diagnosis not present

## 2020-06-09 DIAGNOSIS — Z992 Dependence on renal dialysis: Secondary | ICD-10-CM | POA: Diagnosis not present

## 2020-06-09 DIAGNOSIS — E118 Type 2 diabetes mellitus with unspecified complications: Secondary | ICD-10-CM | POA: Diagnosis not present

## 2020-06-09 DIAGNOSIS — N2581 Secondary hyperparathyroidism of renal origin: Secondary | ICD-10-CM | POA: Diagnosis not present

## 2020-06-09 DIAGNOSIS — D631 Anemia in chronic kidney disease: Secondary | ICD-10-CM | POA: Diagnosis not present

## 2020-06-09 DIAGNOSIS — D509 Iron deficiency anemia, unspecified: Secondary | ICD-10-CM | POA: Diagnosis not present

## 2020-06-09 DIAGNOSIS — N186 End stage renal disease: Secondary | ICD-10-CM | POA: Diagnosis not present

## 2020-06-09 DIAGNOSIS — D689 Coagulation defect, unspecified: Secondary | ICD-10-CM | POA: Diagnosis not present

## 2020-06-11 DIAGNOSIS — D689 Coagulation defect, unspecified: Secondary | ICD-10-CM | POA: Diagnosis not present

## 2020-06-11 DIAGNOSIS — D631 Anemia in chronic kidney disease: Secondary | ICD-10-CM | POA: Diagnosis not present

## 2020-06-11 DIAGNOSIS — D509 Iron deficiency anemia, unspecified: Secondary | ICD-10-CM | POA: Diagnosis not present

## 2020-06-11 DIAGNOSIS — Z992 Dependence on renal dialysis: Secondary | ICD-10-CM | POA: Diagnosis not present

## 2020-06-11 DIAGNOSIS — N186 End stage renal disease: Secondary | ICD-10-CM | POA: Diagnosis not present

## 2020-06-11 DIAGNOSIS — E118 Type 2 diabetes mellitus with unspecified complications: Secondary | ICD-10-CM | POA: Diagnosis not present

## 2020-06-11 DIAGNOSIS — N2581 Secondary hyperparathyroidism of renal origin: Secondary | ICD-10-CM | POA: Diagnosis not present

## 2020-06-14 DIAGNOSIS — D689 Coagulation defect, unspecified: Secondary | ICD-10-CM | POA: Diagnosis not present

## 2020-06-14 DIAGNOSIS — N2581 Secondary hyperparathyroidism of renal origin: Secondary | ICD-10-CM | POA: Diagnosis not present

## 2020-06-14 DIAGNOSIS — E118 Type 2 diabetes mellitus with unspecified complications: Secondary | ICD-10-CM | POA: Diagnosis not present

## 2020-06-14 DIAGNOSIS — N186 End stage renal disease: Secondary | ICD-10-CM | POA: Diagnosis not present

## 2020-06-14 DIAGNOSIS — Z992 Dependence on renal dialysis: Secondary | ICD-10-CM | POA: Diagnosis not present

## 2020-06-14 DIAGNOSIS — D631 Anemia in chronic kidney disease: Secondary | ICD-10-CM | POA: Diagnosis not present

## 2020-06-14 DIAGNOSIS — D509 Iron deficiency anemia, unspecified: Secondary | ICD-10-CM | POA: Diagnosis not present

## 2020-06-16 DIAGNOSIS — D509 Iron deficiency anemia, unspecified: Secondary | ICD-10-CM | POA: Diagnosis not present

## 2020-06-16 DIAGNOSIS — N186 End stage renal disease: Secondary | ICD-10-CM | POA: Diagnosis not present

## 2020-06-16 DIAGNOSIS — E118 Type 2 diabetes mellitus with unspecified complications: Secondary | ICD-10-CM | POA: Diagnosis not present

## 2020-06-16 DIAGNOSIS — D689 Coagulation defect, unspecified: Secondary | ICD-10-CM | POA: Diagnosis not present

## 2020-06-16 DIAGNOSIS — Z992 Dependence on renal dialysis: Secondary | ICD-10-CM | POA: Diagnosis not present

## 2020-06-16 DIAGNOSIS — N2581 Secondary hyperparathyroidism of renal origin: Secondary | ICD-10-CM | POA: Diagnosis not present

## 2020-06-16 DIAGNOSIS — D631 Anemia in chronic kidney disease: Secondary | ICD-10-CM | POA: Diagnosis not present

## 2020-06-18 DIAGNOSIS — D509 Iron deficiency anemia, unspecified: Secondary | ICD-10-CM | POA: Diagnosis not present

## 2020-06-18 DIAGNOSIS — N186 End stage renal disease: Secondary | ICD-10-CM | POA: Diagnosis not present

## 2020-06-18 DIAGNOSIS — D689 Coagulation defect, unspecified: Secondary | ICD-10-CM | POA: Diagnosis not present

## 2020-06-18 DIAGNOSIS — N2581 Secondary hyperparathyroidism of renal origin: Secondary | ICD-10-CM | POA: Diagnosis not present

## 2020-06-18 DIAGNOSIS — Z992 Dependence on renal dialysis: Secondary | ICD-10-CM | POA: Diagnosis not present

## 2020-06-18 DIAGNOSIS — D631 Anemia in chronic kidney disease: Secondary | ICD-10-CM | POA: Diagnosis not present

## 2020-06-18 DIAGNOSIS — E118 Type 2 diabetes mellitus with unspecified complications: Secondary | ICD-10-CM | POA: Diagnosis not present

## 2020-06-21 DIAGNOSIS — N2581 Secondary hyperparathyroidism of renal origin: Secondary | ICD-10-CM | POA: Diagnosis not present

## 2020-06-21 DIAGNOSIS — N186 End stage renal disease: Secondary | ICD-10-CM | POA: Diagnosis not present

## 2020-06-21 DIAGNOSIS — D631 Anemia in chronic kidney disease: Secondary | ICD-10-CM | POA: Diagnosis not present

## 2020-06-21 DIAGNOSIS — E118 Type 2 diabetes mellitus with unspecified complications: Secondary | ICD-10-CM | POA: Diagnosis not present

## 2020-06-21 DIAGNOSIS — Z992 Dependence on renal dialysis: Secondary | ICD-10-CM | POA: Diagnosis not present

## 2020-06-21 DIAGNOSIS — D509 Iron deficiency anemia, unspecified: Secondary | ICD-10-CM | POA: Diagnosis not present

## 2020-06-21 DIAGNOSIS — D689 Coagulation defect, unspecified: Secondary | ICD-10-CM | POA: Diagnosis not present

## 2020-06-23 DIAGNOSIS — N186 End stage renal disease: Secondary | ICD-10-CM | POA: Diagnosis not present

## 2020-06-23 DIAGNOSIS — E118 Type 2 diabetes mellitus with unspecified complications: Secondary | ICD-10-CM | POA: Diagnosis not present

## 2020-06-23 DIAGNOSIS — Z992 Dependence on renal dialysis: Secondary | ICD-10-CM | POA: Diagnosis not present

## 2020-06-23 DIAGNOSIS — N2581 Secondary hyperparathyroidism of renal origin: Secondary | ICD-10-CM | POA: Diagnosis not present

## 2020-06-23 DIAGNOSIS — D631 Anemia in chronic kidney disease: Secondary | ICD-10-CM | POA: Diagnosis not present

## 2020-06-23 DIAGNOSIS — D509 Iron deficiency anemia, unspecified: Secondary | ICD-10-CM | POA: Diagnosis not present

## 2020-06-23 DIAGNOSIS — D689 Coagulation defect, unspecified: Secondary | ICD-10-CM | POA: Diagnosis not present

## 2020-06-25 DIAGNOSIS — Z992 Dependence on renal dialysis: Secondary | ICD-10-CM | POA: Diagnosis not present

## 2020-06-25 DIAGNOSIS — N2581 Secondary hyperparathyroidism of renal origin: Secondary | ICD-10-CM | POA: Diagnosis not present

## 2020-06-25 DIAGNOSIS — D631 Anemia in chronic kidney disease: Secondary | ICD-10-CM | POA: Diagnosis not present

## 2020-06-25 DIAGNOSIS — E118 Type 2 diabetes mellitus with unspecified complications: Secondary | ICD-10-CM | POA: Diagnosis not present

## 2020-06-25 DIAGNOSIS — D689 Coagulation defect, unspecified: Secondary | ICD-10-CM | POA: Diagnosis not present

## 2020-06-25 DIAGNOSIS — N186 End stage renal disease: Secondary | ICD-10-CM | POA: Diagnosis not present

## 2020-06-25 DIAGNOSIS — D509 Iron deficiency anemia, unspecified: Secondary | ICD-10-CM | POA: Diagnosis not present

## 2020-06-26 DIAGNOSIS — E1129 Type 2 diabetes mellitus with other diabetic kidney complication: Secondary | ICD-10-CM | POA: Diagnosis not present

## 2020-06-26 DIAGNOSIS — Z992 Dependence on renal dialysis: Secondary | ICD-10-CM | POA: Diagnosis not present

## 2020-06-26 DIAGNOSIS — N186 End stage renal disease: Secondary | ICD-10-CM | POA: Diagnosis not present

## 2020-06-28 DIAGNOSIS — Z992 Dependence on renal dialysis: Secondary | ICD-10-CM | POA: Diagnosis not present

## 2020-06-28 DIAGNOSIS — N186 End stage renal disease: Secondary | ICD-10-CM | POA: Diagnosis not present

## 2020-06-28 DIAGNOSIS — R519 Headache, unspecified: Secondary | ICD-10-CM | POA: Diagnosis not present

## 2020-06-28 DIAGNOSIS — E118 Type 2 diabetes mellitus with unspecified complications: Secondary | ICD-10-CM | POA: Diagnosis not present

## 2020-06-28 DIAGNOSIS — D509 Iron deficiency anemia, unspecified: Secondary | ICD-10-CM | POA: Diagnosis not present

## 2020-06-28 DIAGNOSIS — D689 Coagulation defect, unspecified: Secondary | ICD-10-CM | POA: Diagnosis not present

## 2020-06-28 DIAGNOSIS — D631 Anemia in chronic kidney disease: Secondary | ICD-10-CM | POA: Diagnosis not present

## 2020-06-28 DIAGNOSIS — R509 Fever, unspecified: Secondary | ICD-10-CM | POA: Diagnosis not present

## 2020-06-28 DIAGNOSIS — N2581 Secondary hyperparathyroidism of renal origin: Secondary | ICD-10-CM | POA: Diagnosis not present

## 2020-06-30 DIAGNOSIS — N2581 Secondary hyperparathyroidism of renal origin: Secondary | ICD-10-CM | POA: Diagnosis not present

## 2020-06-30 DIAGNOSIS — E118 Type 2 diabetes mellitus with unspecified complications: Secondary | ICD-10-CM | POA: Diagnosis not present

## 2020-06-30 DIAGNOSIS — D631 Anemia in chronic kidney disease: Secondary | ICD-10-CM | POA: Diagnosis not present

## 2020-06-30 DIAGNOSIS — R509 Fever, unspecified: Secondary | ICD-10-CM | POA: Diagnosis not present

## 2020-06-30 DIAGNOSIS — R519 Headache, unspecified: Secondary | ICD-10-CM | POA: Diagnosis not present

## 2020-06-30 DIAGNOSIS — Z992 Dependence on renal dialysis: Secondary | ICD-10-CM | POA: Diagnosis not present

## 2020-06-30 DIAGNOSIS — D509 Iron deficiency anemia, unspecified: Secondary | ICD-10-CM | POA: Diagnosis not present

## 2020-06-30 DIAGNOSIS — N186 End stage renal disease: Secondary | ICD-10-CM | POA: Diagnosis not present

## 2020-06-30 DIAGNOSIS — D689 Coagulation defect, unspecified: Secondary | ICD-10-CM | POA: Diagnosis not present

## 2020-07-02 DIAGNOSIS — D689 Coagulation defect, unspecified: Secondary | ICD-10-CM | POA: Diagnosis not present

## 2020-07-02 DIAGNOSIS — N2581 Secondary hyperparathyroidism of renal origin: Secondary | ICD-10-CM | POA: Diagnosis not present

## 2020-07-02 DIAGNOSIS — R509 Fever, unspecified: Secondary | ICD-10-CM | POA: Diagnosis not present

## 2020-07-02 DIAGNOSIS — R519 Headache, unspecified: Secondary | ICD-10-CM | POA: Diagnosis not present

## 2020-07-02 DIAGNOSIS — E118 Type 2 diabetes mellitus with unspecified complications: Secondary | ICD-10-CM | POA: Diagnosis not present

## 2020-07-02 DIAGNOSIS — D631 Anemia in chronic kidney disease: Secondary | ICD-10-CM | POA: Diagnosis not present

## 2020-07-02 DIAGNOSIS — D509 Iron deficiency anemia, unspecified: Secondary | ICD-10-CM | POA: Diagnosis not present

## 2020-07-02 DIAGNOSIS — N186 End stage renal disease: Secondary | ICD-10-CM | POA: Diagnosis not present

## 2020-07-02 DIAGNOSIS — Z992 Dependence on renal dialysis: Secondary | ICD-10-CM | POA: Diagnosis not present

## 2020-07-05 DIAGNOSIS — N2581 Secondary hyperparathyroidism of renal origin: Secondary | ICD-10-CM | POA: Diagnosis not present

## 2020-07-05 DIAGNOSIS — R519 Headache, unspecified: Secondary | ICD-10-CM | POA: Diagnosis not present

## 2020-07-05 DIAGNOSIS — R509 Fever, unspecified: Secondary | ICD-10-CM | POA: Diagnosis not present

## 2020-07-05 DIAGNOSIS — E118 Type 2 diabetes mellitus with unspecified complications: Secondary | ICD-10-CM | POA: Diagnosis not present

## 2020-07-05 DIAGNOSIS — D631 Anemia in chronic kidney disease: Secondary | ICD-10-CM | POA: Diagnosis not present

## 2020-07-05 DIAGNOSIS — D509 Iron deficiency anemia, unspecified: Secondary | ICD-10-CM | POA: Diagnosis not present

## 2020-07-05 DIAGNOSIS — Z992 Dependence on renal dialysis: Secondary | ICD-10-CM | POA: Diagnosis not present

## 2020-07-05 DIAGNOSIS — D689 Coagulation defect, unspecified: Secondary | ICD-10-CM | POA: Diagnosis not present

## 2020-07-05 DIAGNOSIS — N186 End stage renal disease: Secondary | ICD-10-CM | POA: Diagnosis not present

## 2020-07-07 DIAGNOSIS — R509 Fever, unspecified: Secondary | ICD-10-CM | POA: Diagnosis not present

## 2020-07-07 DIAGNOSIS — Z992 Dependence on renal dialysis: Secondary | ICD-10-CM | POA: Diagnosis not present

## 2020-07-07 DIAGNOSIS — D509 Iron deficiency anemia, unspecified: Secondary | ICD-10-CM | POA: Diagnosis not present

## 2020-07-07 DIAGNOSIS — D689 Coagulation defect, unspecified: Secondary | ICD-10-CM | POA: Diagnosis not present

## 2020-07-07 DIAGNOSIS — N186 End stage renal disease: Secondary | ICD-10-CM | POA: Diagnosis not present

## 2020-07-07 DIAGNOSIS — R519 Headache, unspecified: Secondary | ICD-10-CM | POA: Diagnosis not present

## 2020-07-07 DIAGNOSIS — E118 Type 2 diabetes mellitus with unspecified complications: Secondary | ICD-10-CM | POA: Diagnosis not present

## 2020-07-07 DIAGNOSIS — D631 Anemia in chronic kidney disease: Secondary | ICD-10-CM | POA: Diagnosis not present

## 2020-07-07 DIAGNOSIS — N2581 Secondary hyperparathyroidism of renal origin: Secondary | ICD-10-CM | POA: Diagnosis not present

## 2020-07-09 DIAGNOSIS — N186 End stage renal disease: Secondary | ICD-10-CM | POA: Diagnosis not present

## 2020-07-09 DIAGNOSIS — D509 Iron deficiency anemia, unspecified: Secondary | ICD-10-CM | POA: Diagnosis not present

## 2020-07-09 DIAGNOSIS — D631 Anemia in chronic kidney disease: Secondary | ICD-10-CM | POA: Diagnosis not present

## 2020-07-09 DIAGNOSIS — E118 Type 2 diabetes mellitus with unspecified complications: Secondary | ICD-10-CM | POA: Diagnosis not present

## 2020-07-09 DIAGNOSIS — D689 Coagulation defect, unspecified: Secondary | ICD-10-CM | POA: Diagnosis not present

## 2020-07-09 DIAGNOSIS — Z992 Dependence on renal dialysis: Secondary | ICD-10-CM | POA: Diagnosis not present

## 2020-07-09 DIAGNOSIS — R519 Headache, unspecified: Secondary | ICD-10-CM | POA: Diagnosis not present

## 2020-07-09 DIAGNOSIS — N2581 Secondary hyperparathyroidism of renal origin: Secondary | ICD-10-CM | POA: Diagnosis not present

## 2020-07-09 DIAGNOSIS — R509 Fever, unspecified: Secondary | ICD-10-CM | POA: Diagnosis not present

## 2020-07-12 DIAGNOSIS — R519 Headache, unspecified: Secondary | ICD-10-CM | POA: Diagnosis not present

## 2020-07-12 DIAGNOSIS — D689 Coagulation defect, unspecified: Secondary | ICD-10-CM | POA: Diagnosis not present

## 2020-07-12 DIAGNOSIS — Z992 Dependence on renal dialysis: Secondary | ICD-10-CM | POA: Diagnosis not present

## 2020-07-12 DIAGNOSIS — D509 Iron deficiency anemia, unspecified: Secondary | ICD-10-CM | POA: Diagnosis not present

## 2020-07-12 DIAGNOSIS — D631 Anemia in chronic kidney disease: Secondary | ICD-10-CM | POA: Diagnosis not present

## 2020-07-12 DIAGNOSIS — N2581 Secondary hyperparathyroidism of renal origin: Secondary | ICD-10-CM | POA: Diagnosis not present

## 2020-07-12 DIAGNOSIS — E118 Type 2 diabetes mellitus with unspecified complications: Secondary | ICD-10-CM | POA: Diagnosis not present

## 2020-07-12 DIAGNOSIS — R509 Fever, unspecified: Secondary | ICD-10-CM | POA: Diagnosis not present

## 2020-07-12 DIAGNOSIS — N186 End stage renal disease: Secondary | ICD-10-CM | POA: Diagnosis not present

## 2020-07-13 DIAGNOSIS — I5030 Unspecified diastolic (congestive) heart failure: Secondary | ICD-10-CM | POA: Diagnosis not present

## 2020-07-13 DIAGNOSIS — Z8673 Personal history of transient ischemic attack (TIA), and cerebral infarction without residual deficits: Secondary | ICD-10-CM | POA: Diagnosis not present

## 2020-07-13 DIAGNOSIS — Z9889 Other specified postprocedural states: Secondary | ICD-10-CM | POA: Diagnosis not present

## 2020-07-13 DIAGNOSIS — E559 Vitamin D deficiency, unspecified: Secondary | ICD-10-CM | POA: Diagnosis not present

## 2020-07-13 DIAGNOSIS — Z95818 Presence of other cardiac implants and grafts: Secondary | ICD-10-CM | POA: Diagnosis not present

## 2020-07-13 DIAGNOSIS — I272 Pulmonary hypertension, unspecified: Secondary | ICD-10-CM | POA: Diagnosis not present

## 2020-07-13 DIAGNOSIS — I120 Hypertensive chronic kidney disease with stage 5 chronic kidney disease or end stage renal disease: Secondary | ICD-10-CM | POA: Diagnosis not present

## 2020-07-13 DIAGNOSIS — Z992 Dependence on renal dialysis: Secondary | ICD-10-CM | POA: Diagnosis not present

## 2020-07-13 DIAGNOSIS — Z79899 Other long term (current) drug therapy: Secondary | ICD-10-CM | POA: Diagnosis not present

## 2020-07-13 DIAGNOSIS — I5032 Chronic diastolic (congestive) heart failure: Secondary | ICD-10-CM | POA: Diagnosis not present

## 2020-07-13 DIAGNOSIS — Z8679 Personal history of other diseases of the circulatory system: Secondary | ICD-10-CM | POA: Diagnosis not present

## 2020-07-13 DIAGNOSIS — N185 Chronic kidney disease, stage 5: Secondary | ICD-10-CM | POA: Diagnosis not present

## 2020-07-13 DIAGNOSIS — E1122 Type 2 diabetes mellitus with diabetic chronic kidney disease: Secondary | ICD-10-CM | POA: Diagnosis not present

## 2020-07-13 DIAGNOSIS — Z794 Long term (current) use of insulin: Secondary | ICD-10-CM | POA: Diagnosis not present

## 2020-07-13 DIAGNOSIS — I517 Cardiomegaly: Secondary | ICD-10-CM | POA: Diagnosis not present

## 2020-07-14 DIAGNOSIS — D509 Iron deficiency anemia, unspecified: Secondary | ICD-10-CM | POA: Diagnosis not present

## 2020-07-14 DIAGNOSIS — E118 Type 2 diabetes mellitus with unspecified complications: Secondary | ICD-10-CM | POA: Diagnosis not present

## 2020-07-14 DIAGNOSIS — R509 Fever, unspecified: Secondary | ICD-10-CM | POA: Diagnosis not present

## 2020-07-14 DIAGNOSIS — Z992 Dependence on renal dialysis: Secondary | ICD-10-CM | POA: Diagnosis not present

## 2020-07-14 DIAGNOSIS — N186 End stage renal disease: Secondary | ICD-10-CM | POA: Diagnosis not present

## 2020-07-14 DIAGNOSIS — R519 Headache, unspecified: Secondary | ICD-10-CM | POA: Diagnosis not present

## 2020-07-14 DIAGNOSIS — D631 Anemia in chronic kidney disease: Secondary | ICD-10-CM | POA: Diagnosis not present

## 2020-07-14 DIAGNOSIS — D689 Coagulation defect, unspecified: Secondary | ICD-10-CM | POA: Diagnosis not present

## 2020-07-14 DIAGNOSIS — N2581 Secondary hyperparathyroidism of renal origin: Secondary | ICD-10-CM | POA: Diagnosis not present

## 2020-07-16 DIAGNOSIS — N2581 Secondary hyperparathyroidism of renal origin: Secondary | ICD-10-CM | POA: Diagnosis not present

## 2020-07-16 DIAGNOSIS — N186 End stage renal disease: Secondary | ICD-10-CM | POA: Diagnosis not present

## 2020-07-16 DIAGNOSIS — D631 Anemia in chronic kidney disease: Secondary | ICD-10-CM | POA: Diagnosis not present

## 2020-07-16 DIAGNOSIS — D509 Iron deficiency anemia, unspecified: Secondary | ICD-10-CM | POA: Diagnosis not present

## 2020-07-16 DIAGNOSIS — R509 Fever, unspecified: Secondary | ICD-10-CM | POA: Diagnosis not present

## 2020-07-16 DIAGNOSIS — Z992 Dependence on renal dialysis: Secondary | ICD-10-CM | POA: Diagnosis not present

## 2020-07-16 DIAGNOSIS — D689 Coagulation defect, unspecified: Secondary | ICD-10-CM | POA: Diagnosis not present

## 2020-07-16 DIAGNOSIS — R519 Headache, unspecified: Secondary | ICD-10-CM | POA: Diagnosis not present

## 2020-07-16 DIAGNOSIS — E118 Type 2 diabetes mellitus with unspecified complications: Secondary | ICD-10-CM | POA: Diagnosis not present

## 2020-07-19 DIAGNOSIS — R519 Headache, unspecified: Secondary | ICD-10-CM | POA: Diagnosis not present

## 2020-07-19 DIAGNOSIS — D689 Coagulation defect, unspecified: Secondary | ICD-10-CM | POA: Diagnosis not present

## 2020-07-19 DIAGNOSIS — D631 Anemia in chronic kidney disease: Secondary | ICD-10-CM | POA: Diagnosis not present

## 2020-07-19 DIAGNOSIS — R509 Fever, unspecified: Secondary | ICD-10-CM | POA: Diagnosis not present

## 2020-07-19 DIAGNOSIS — E118 Type 2 diabetes mellitus with unspecified complications: Secondary | ICD-10-CM | POA: Diagnosis not present

## 2020-07-19 DIAGNOSIS — N2581 Secondary hyperparathyroidism of renal origin: Secondary | ICD-10-CM | POA: Diagnosis not present

## 2020-07-19 DIAGNOSIS — N186 End stage renal disease: Secondary | ICD-10-CM | POA: Diagnosis not present

## 2020-07-19 DIAGNOSIS — D509 Iron deficiency anemia, unspecified: Secondary | ICD-10-CM | POA: Diagnosis not present

## 2020-07-19 DIAGNOSIS — Z992 Dependence on renal dialysis: Secondary | ICD-10-CM | POA: Diagnosis not present

## 2020-07-21 DIAGNOSIS — E118 Type 2 diabetes mellitus with unspecified complications: Secondary | ICD-10-CM | POA: Diagnosis not present

## 2020-07-21 DIAGNOSIS — D509 Iron deficiency anemia, unspecified: Secondary | ICD-10-CM | POA: Diagnosis not present

## 2020-07-21 DIAGNOSIS — R509 Fever, unspecified: Secondary | ICD-10-CM | POA: Diagnosis not present

## 2020-07-21 DIAGNOSIS — N2581 Secondary hyperparathyroidism of renal origin: Secondary | ICD-10-CM | POA: Diagnosis not present

## 2020-07-21 DIAGNOSIS — D631 Anemia in chronic kidney disease: Secondary | ICD-10-CM | POA: Diagnosis not present

## 2020-07-21 DIAGNOSIS — N186 End stage renal disease: Secondary | ICD-10-CM | POA: Diagnosis not present

## 2020-07-21 DIAGNOSIS — Z992 Dependence on renal dialysis: Secondary | ICD-10-CM | POA: Diagnosis not present

## 2020-07-21 DIAGNOSIS — R519 Headache, unspecified: Secondary | ICD-10-CM | POA: Diagnosis not present

## 2020-07-21 DIAGNOSIS — D689 Coagulation defect, unspecified: Secondary | ICD-10-CM | POA: Diagnosis not present

## 2020-07-23 DIAGNOSIS — D509 Iron deficiency anemia, unspecified: Secondary | ICD-10-CM | POA: Diagnosis not present

## 2020-07-23 DIAGNOSIS — R509 Fever, unspecified: Secondary | ICD-10-CM | POA: Diagnosis not present

## 2020-07-23 DIAGNOSIS — N2581 Secondary hyperparathyroidism of renal origin: Secondary | ICD-10-CM | POA: Diagnosis not present

## 2020-07-23 DIAGNOSIS — D689 Coagulation defect, unspecified: Secondary | ICD-10-CM | POA: Diagnosis not present

## 2020-07-23 DIAGNOSIS — E118 Type 2 diabetes mellitus with unspecified complications: Secondary | ICD-10-CM | POA: Diagnosis not present

## 2020-07-23 DIAGNOSIS — R519 Headache, unspecified: Secondary | ICD-10-CM | POA: Diagnosis not present

## 2020-07-23 DIAGNOSIS — N186 End stage renal disease: Secondary | ICD-10-CM | POA: Diagnosis not present

## 2020-07-23 DIAGNOSIS — Z992 Dependence on renal dialysis: Secondary | ICD-10-CM | POA: Diagnosis not present

## 2020-07-23 DIAGNOSIS — D631 Anemia in chronic kidney disease: Secondary | ICD-10-CM | POA: Diagnosis not present

## 2020-07-26 DIAGNOSIS — R519 Headache, unspecified: Secondary | ICD-10-CM | POA: Diagnosis not present

## 2020-07-26 DIAGNOSIS — Z992 Dependence on renal dialysis: Secondary | ICD-10-CM | POA: Diagnosis not present

## 2020-07-26 DIAGNOSIS — D631 Anemia in chronic kidney disease: Secondary | ICD-10-CM | POA: Diagnosis not present

## 2020-07-26 DIAGNOSIS — R509 Fever, unspecified: Secondary | ICD-10-CM | POA: Diagnosis not present

## 2020-07-26 DIAGNOSIS — N2581 Secondary hyperparathyroidism of renal origin: Secondary | ICD-10-CM | POA: Diagnosis not present

## 2020-07-26 DIAGNOSIS — E118 Type 2 diabetes mellitus with unspecified complications: Secondary | ICD-10-CM | POA: Diagnosis not present

## 2020-07-26 DIAGNOSIS — D689 Coagulation defect, unspecified: Secondary | ICD-10-CM | POA: Diagnosis not present

## 2020-07-26 DIAGNOSIS — D509 Iron deficiency anemia, unspecified: Secondary | ICD-10-CM | POA: Diagnosis not present

## 2020-07-26 DIAGNOSIS — N186 End stage renal disease: Secondary | ICD-10-CM | POA: Diagnosis not present

## 2020-07-27 DIAGNOSIS — E1129 Type 2 diabetes mellitus with other diabetic kidney complication: Secondary | ICD-10-CM | POA: Diagnosis not present

## 2020-07-27 DIAGNOSIS — Z992 Dependence on renal dialysis: Secondary | ICD-10-CM | POA: Diagnosis not present

## 2020-07-27 DIAGNOSIS — N186 End stage renal disease: Secondary | ICD-10-CM | POA: Diagnosis not present

## 2020-07-28 DIAGNOSIS — Z992 Dependence on renal dialysis: Secondary | ICD-10-CM | POA: Diagnosis not present

## 2020-07-28 DIAGNOSIS — E118 Type 2 diabetes mellitus with unspecified complications: Secondary | ICD-10-CM | POA: Diagnosis not present

## 2020-07-28 DIAGNOSIS — D689 Coagulation defect, unspecified: Secondary | ICD-10-CM | POA: Diagnosis not present

## 2020-07-28 DIAGNOSIS — N2581 Secondary hyperparathyroidism of renal origin: Secondary | ICD-10-CM | POA: Diagnosis not present

## 2020-07-28 DIAGNOSIS — D631 Anemia in chronic kidney disease: Secondary | ICD-10-CM | POA: Diagnosis not present

## 2020-07-28 DIAGNOSIS — D509 Iron deficiency anemia, unspecified: Secondary | ICD-10-CM | POA: Diagnosis not present

## 2020-07-28 DIAGNOSIS — N186 End stage renal disease: Secondary | ICD-10-CM | POA: Diagnosis not present

## 2020-07-30 DIAGNOSIS — D631 Anemia in chronic kidney disease: Secondary | ICD-10-CM | POA: Diagnosis not present

## 2020-07-30 DIAGNOSIS — Z992 Dependence on renal dialysis: Secondary | ICD-10-CM | POA: Diagnosis not present

## 2020-07-30 DIAGNOSIS — N2581 Secondary hyperparathyroidism of renal origin: Secondary | ICD-10-CM | POA: Diagnosis not present

## 2020-07-30 DIAGNOSIS — E118 Type 2 diabetes mellitus with unspecified complications: Secondary | ICD-10-CM | POA: Diagnosis not present

## 2020-07-30 DIAGNOSIS — D689 Coagulation defect, unspecified: Secondary | ICD-10-CM | POA: Diagnosis not present

## 2020-07-30 DIAGNOSIS — D509 Iron deficiency anemia, unspecified: Secondary | ICD-10-CM | POA: Diagnosis not present

## 2020-07-30 DIAGNOSIS — N186 End stage renal disease: Secondary | ICD-10-CM | POA: Diagnosis not present

## 2020-08-02 DIAGNOSIS — D689 Coagulation defect, unspecified: Secondary | ICD-10-CM | POA: Diagnosis not present

## 2020-08-02 DIAGNOSIS — N2581 Secondary hyperparathyroidism of renal origin: Secondary | ICD-10-CM | POA: Diagnosis not present

## 2020-08-02 DIAGNOSIS — E118 Type 2 diabetes mellitus with unspecified complications: Secondary | ICD-10-CM | POA: Diagnosis not present

## 2020-08-02 DIAGNOSIS — N186 End stage renal disease: Secondary | ICD-10-CM | POA: Diagnosis not present

## 2020-08-02 DIAGNOSIS — Z992 Dependence on renal dialysis: Secondary | ICD-10-CM | POA: Diagnosis not present

## 2020-08-02 DIAGNOSIS — D509 Iron deficiency anemia, unspecified: Secondary | ICD-10-CM | POA: Diagnosis not present

## 2020-08-02 DIAGNOSIS — D631 Anemia in chronic kidney disease: Secondary | ICD-10-CM | POA: Diagnosis not present

## 2020-08-04 DIAGNOSIS — N2581 Secondary hyperparathyroidism of renal origin: Secondary | ICD-10-CM | POA: Diagnosis not present

## 2020-08-04 DIAGNOSIS — D631 Anemia in chronic kidney disease: Secondary | ICD-10-CM | POA: Diagnosis not present

## 2020-08-04 DIAGNOSIS — D689 Coagulation defect, unspecified: Secondary | ICD-10-CM | POA: Diagnosis not present

## 2020-08-04 DIAGNOSIS — N186 End stage renal disease: Secondary | ICD-10-CM | POA: Diagnosis not present

## 2020-08-04 DIAGNOSIS — D509 Iron deficiency anemia, unspecified: Secondary | ICD-10-CM | POA: Diagnosis not present

## 2020-08-04 DIAGNOSIS — E118 Type 2 diabetes mellitus with unspecified complications: Secondary | ICD-10-CM | POA: Diagnosis not present

## 2020-08-04 DIAGNOSIS — Z992 Dependence on renal dialysis: Secondary | ICD-10-CM | POA: Diagnosis not present

## 2020-08-06 DIAGNOSIS — D509 Iron deficiency anemia, unspecified: Secondary | ICD-10-CM | POA: Diagnosis not present

## 2020-08-06 DIAGNOSIS — D689 Coagulation defect, unspecified: Secondary | ICD-10-CM | POA: Diagnosis not present

## 2020-08-06 DIAGNOSIS — N186 End stage renal disease: Secondary | ICD-10-CM | POA: Diagnosis not present

## 2020-08-06 DIAGNOSIS — D631 Anemia in chronic kidney disease: Secondary | ICD-10-CM | POA: Diagnosis not present

## 2020-08-06 DIAGNOSIS — N2581 Secondary hyperparathyroidism of renal origin: Secondary | ICD-10-CM | POA: Diagnosis not present

## 2020-08-06 DIAGNOSIS — E118 Type 2 diabetes mellitus with unspecified complications: Secondary | ICD-10-CM | POA: Diagnosis not present

## 2020-08-06 DIAGNOSIS — Z992 Dependence on renal dialysis: Secondary | ICD-10-CM | POA: Diagnosis not present

## 2020-08-09 DIAGNOSIS — D509 Iron deficiency anemia, unspecified: Secondary | ICD-10-CM | POA: Diagnosis not present

## 2020-08-09 DIAGNOSIS — E118 Type 2 diabetes mellitus with unspecified complications: Secondary | ICD-10-CM | POA: Diagnosis not present

## 2020-08-09 DIAGNOSIS — D631 Anemia in chronic kidney disease: Secondary | ICD-10-CM | POA: Diagnosis not present

## 2020-08-09 DIAGNOSIS — Z992 Dependence on renal dialysis: Secondary | ICD-10-CM | POA: Diagnosis not present

## 2020-08-09 DIAGNOSIS — N2581 Secondary hyperparathyroidism of renal origin: Secondary | ICD-10-CM | POA: Diagnosis not present

## 2020-08-09 DIAGNOSIS — N186 End stage renal disease: Secondary | ICD-10-CM | POA: Diagnosis not present

## 2020-08-09 DIAGNOSIS — D689 Coagulation defect, unspecified: Secondary | ICD-10-CM | POA: Diagnosis not present

## 2020-08-11 DIAGNOSIS — D631 Anemia in chronic kidney disease: Secondary | ICD-10-CM | POA: Diagnosis not present

## 2020-08-11 DIAGNOSIS — D689 Coagulation defect, unspecified: Secondary | ICD-10-CM | POA: Diagnosis not present

## 2020-08-11 DIAGNOSIS — D509 Iron deficiency anemia, unspecified: Secondary | ICD-10-CM | POA: Diagnosis not present

## 2020-08-11 DIAGNOSIS — E118 Type 2 diabetes mellitus with unspecified complications: Secondary | ICD-10-CM | POA: Diagnosis not present

## 2020-08-11 DIAGNOSIS — N2581 Secondary hyperparathyroidism of renal origin: Secondary | ICD-10-CM | POA: Diagnosis not present

## 2020-08-11 DIAGNOSIS — N186 End stage renal disease: Secondary | ICD-10-CM | POA: Diagnosis not present

## 2020-08-11 DIAGNOSIS — Z992 Dependence on renal dialysis: Secondary | ICD-10-CM | POA: Diagnosis not present

## 2020-08-12 DIAGNOSIS — I120 Hypertensive chronic kidney disease with stage 5 chronic kidney disease or end stage renal disease: Secondary | ICD-10-CM | POA: Diagnosis not present

## 2020-08-12 DIAGNOSIS — I420 Dilated cardiomyopathy: Secondary | ICD-10-CM | POA: Diagnosis not present

## 2020-08-12 DIAGNOSIS — N186 End stage renal disease: Secondary | ICD-10-CM | POA: Diagnosis not present

## 2020-08-12 DIAGNOSIS — Z992 Dependence on renal dialysis: Secondary | ICD-10-CM | POA: Diagnosis not present

## 2020-08-12 DIAGNOSIS — N185 Chronic kidney disease, stage 5: Secondary | ICD-10-CM | POA: Diagnosis not present

## 2020-08-12 DIAGNOSIS — R918 Other nonspecific abnormal finding of lung field: Secondary | ICD-10-CM | POA: Diagnosis not present

## 2020-08-12 DIAGNOSIS — Z794 Long term (current) use of insulin: Secondary | ICD-10-CM | POA: Diagnosis not present

## 2020-08-12 DIAGNOSIS — R590 Localized enlarged lymph nodes: Secondary | ICD-10-CM | POA: Diagnosis not present

## 2020-08-12 DIAGNOSIS — Z8673 Personal history of transient ischemic attack (TIA), and cerebral infarction without residual deficits: Secondary | ICD-10-CM | POA: Diagnosis not present

## 2020-08-12 DIAGNOSIS — I272 Pulmonary hypertension, unspecified: Secondary | ICD-10-CM | POA: Diagnosis not present

## 2020-08-12 DIAGNOSIS — E1122 Type 2 diabetes mellitus with diabetic chronic kidney disease: Secondary | ICD-10-CM | POA: Diagnosis not present

## 2020-08-12 DIAGNOSIS — Z79899 Other long term (current) drug therapy: Secondary | ICD-10-CM | POA: Diagnosis not present

## 2020-08-12 DIAGNOSIS — R0902 Hypoxemia: Secondary | ICD-10-CM | POA: Diagnosis not present

## 2020-08-12 DIAGNOSIS — Z01818 Encounter for other preprocedural examination: Secondary | ICD-10-CM | POA: Diagnosis not present

## 2020-08-13 DIAGNOSIS — D631 Anemia in chronic kidney disease: Secondary | ICD-10-CM | POA: Diagnosis not present

## 2020-08-13 DIAGNOSIS — N2581 Secondary hyperparathyroidism of renal origin: Secondary | ICD-10-CM | POA: Diagnosis not present

## 2020-08-13 DIAGNOSIS — D509 Iron deficiency anemia, unspecified: Secondary | ICD-10-CM | POA: Diagnosis not present

## 2020-08-13 DIAGNOSIS — D689 Coagulation defect, unspecified: Secondary | ICD-10-CM | POA: Diagnosis not present

## 2020-08-13 DIAGNOSIS — N186 End stage renal disease: Secondary | ICD-10-CM | POA: Diagnosis not present

## 2020-08-13 DIAGNOSIS — Z992 Dependence on renal dialysis: Secondary | ICD-10-CM | POA: Diagnosis not present

## 2020-08-13 DIAGNOSIS — E118 Type 2 diabetes mellitus with unspecified complications: Secondary | ICD-10-CM | POA: Diagnosis not present

## 2020-08-16 DIAGNOSIS — D509 Iron deficiency anemia, unspecified: Secondary | ICD-10-CM | POA: Diagnosis not present

## 2020-08-16 DIAGNOSIS — N186 End stage renal disease: Secondary | ICD-10-CM | POA: Diagnosis not present

## 2020-08-16 DIAGNOSIS — E118 Type 2 diabetes mellitus with unspecified complications: Secondary | ICD-10-CM | POA: Diagnosis not present

## 2020-08-16 DIAGNOSIS — N2581 Secondary hyperparathyroidism of renal origin: Secondary | ICD-10-CM | POA: Diagnosis not present

## 2020-08-16 DIAGNOSIS — D631 Anemia in chronic kidney disease: Secondary | ICD-10-CM | POA: Diagnosis not present

## 2020-08-16 DIAGNOSIS — D689 Coagulation defect, unspecified: Secondary | ICD-10-CM | POA: Diagnosis not present

## 2020-08-16 DIAGNOSIS — Z992 Dependence on renal dialysis: Secondary | ICD-10-CM | POA: Diagnosis not present

## 2020-08-17 DIAGNOSIS — J984 Other disorders of lung: Secondary | ICD-10-CM | POA: Diagnosis not present

## 2020-08-18 DIAGNOSIS — D689 Coagulation defect, unspecified: Secondary | ICD-10-CM | POA: Diagnosis not present

## 2020-08-18 DIAGNOSIS — D509 Iron deficiency anemia, unspecified: Secondary | ICD-10-CM | POA: Diagnosis not present

## 2020-08-18 DIAGNOSIS — Z992 Dependence on renal dialysis: Secondary | ICD-10-CM | POA: Diagnosis not present

## 2020-08-18 DIAGNOSIS — N2581 Secondary hyperparathyroidism of renal origin: Secondary | ICD-10-CM | POA: Diagnosis not present

## 2020-08-18 DIAGNOSIS — E118 Type 2 diabetes mellitus with unspecified complications: Secondary | ICD-10-CM | POA: Diagnosis not present

## 2020-08-18 DIAGNOSIS — N186 End stage renal disease: Secondary | ICD-10-CM | POA: Diagnosis not present

## 2020-08-18 DIAGNOSIS — D631 Anemia in chronic kidney disease: Secondary | ICD-10-CM | POA: Diagnosis not present

## 2020-08-20 DIAGNOSIS — N186 End stage renal disease: Secondary | ICD-10-CM | POA: Diagnosis not present

## 2020-08-20 DIAGNOSIS — N2581 Secondary hyperparathyroidism of renal origin: Secondary | ICD-10-CM | POA: Diagnosis not present

## 2020-08-20 DIAGNOSIS — D689 Coagulation defect, unspecified: Secondary | ICD-10-CM | POA: Diagnosis not present

## 2020-08-20 DIAGNOSIS — Z992 Dependence on renal dialysis: Secondary | ICD-10-CM | POA: Diagnosis not present

## 2020-08-20 DIAGNOSIS — D509 Iron deficiency anemia, unspecified: Secondary | ICD-10-CM | POA: Diagnosis not present

## 2020-08-20 DIAGNOSIS — D631 Anemia in chronic kidney disease: Secondary | ICD-10-CM | POA: Diagnosis not present

## 2020-08-20 DIAGNOSIS — E118 Type 2 diabetes mellitus with unspecified complications: Secondary | ICD-10-CM | POA: Diagnosis not present

## 2020-08-23 DIAGNOSIS — D689 Coagulation defect, unspecified: Secondary | ICD-10-CM | POA: Diagnosis not present

## 2020-08-23 DIAGNOSIS — D631 Anemia in chronic kidney disease: Secondary | ICD-10-CM | POA: Diagnosis not present

## 2020-08-23 DIAGNOSIS — D509 Iron deficiency anemia, unspecified: Secondary | ICD-10-CM | POA: Diagnosis not present

## 2020-08-23 DIAGNOSIS — E118 Type 2 diabetes mellitus with unspecified complications: Secondary | ICD-10-CM | POA: Diagnosis not present

## 2020-08-23 DIAGNOSIS — N2581 Secondary hyperparathyroidism of renal origin: Secondary | ICD-10-CM | POA: Diagnosis not present

## 2020-08-23 DIAGNOSIS — N186 End stage renal disease: Secondary | ICD-10-CM | POA: Diagnosis not present

## 2020-08-23 DIAGNOSIS — Z992 Dependence on renal dialysis: Secondary | ICD-10-CM | POA: Diagnosis not present

## 2020-08-24 DIAGNOSIS — J810 Acute pulmonary edema: Secondary | ICD-10-CM | POA: Diagnosis not present

## 2020-08-24 DIAGNOSIS — J986 Disorders of diaphragm: Secondary | ICD-10-CM | POA: Diagnosis not present

## 2020-08-24 DIAGNOSIS — I272 Pulmonary hypertension, unspecified: Secondary | ICD-10-CM | POA: Diagnosis not present

## 2020-08-25 DIAGNOSIS — Z992 Dependence on renal dialysis: Secondary | ICD-10-CM | POA: Diagnosis not present

## 2020-08-25 DIAGNOSIS — N2581 Secondary hyperparathyroidism of renal origin: Secondary | ICD-10-CM | POA: Diagnosis not present

## 2020-08-25 DIAGNOSIS — D509 Iron deficiency anemia, unspecified: Secondary | ICD-10-CM | POA: Diagnosis not present

## 2020-08-25 DIAGNOSIS — D689 Coagulation defect, unspecified: Secondary | ICD-10-CM | POA: Diagnosis not present

## 2020-08-25 DIAGNOSIS — N186 End stage renal disease: Secondary | ICD-10-CM | POA: Diagnosis not present

## 2020-08-25 DIAGNOSIS — E118 Type 2 diabetes mellitus with unspecified complications: Secondary | ICD-10-CM | POA: Diagnosis not present

## 2020-08-25 DIAGNOSIS — D631 Anemia in chronic kidney disease: Secondary | ICD-10-CM | POA: Diagnosis not present

## 2020-08-26 DIAGNOSIS — N186 End stage renal disease: Secondary | ICD-10-CM | POA: Diagnosis not present

## 2020-08-26 DIAGNOSIS — E1129 Type 2 diabetes mellitus with other diabetic kidney complication: Secondary | ICD-10-CM | POA: Diagnosis not present

## 2020-08-26 DIAGNOSIS — Z992 Dependence on renal dialysis: Secondary | ICD-10-CM | POA: Diagnosis not present

## 2020-08-27 DIAGNOSIS — D509 Iron deficiency anemia, unspecified: Secondary | ICD-10-CM | POA: Diagnosis not present

## 2020-08-27 DIAGNOSIS — Z992 Dependence on renal dialysis: Secondary | ICD-10-CM | POA: Diagnosis not present

## 2020-08-27 DIAGNOSIS — D631 Anemia in chronic kidney disease: Secondary | ICD-10-CM | POA: Diagnosis not present

## 2020-08-27 DIAGNOSIS — N2581 Secondary hyperparathyroidism of renal origin: Secondary | ICD-10-CM | POA: Diagnosis not present

## 2020-08-27 DIAGNOSIS — Z23 Encounter for immunization: Secondary | ICD-10-CM | POA: Diagnosis not present

## 2020-08-27 DIAGNOSIS — N186 End stage renal disease: Secondary | ICD-10-CM | POA: Diagnosis not present

## 2020-08-27 DIAGNOSIS — D689 Coagulation defect, unspecified: Secondary | ICD-10-CM | POA: Diagnosis not present

## 2020-08-27 DIAGNOSIS — E118 Type 2 diabetes mellitus with unspecified complications: Secondary | ICD-10-CM | POA: Diagnosis not present

## 2020-08-30 DIAGNOSIS — E118 Type 2 diabetes mellitus with unspecified complications: Secondary | ICD-10-CM | POA: Diagnosis not present

## 2020-08-30 DIAGNOSIS — D631 Anemia in chronic kidney disease: Secondary | ICD-10-CM | POA: Diagnosis not present

## 2020-08-30 DIAGNOSIS — Z23 Encounter for immunization: Secondary | ICD-10-CM | POA: Diagnosis not present

## 2020-08-30 DIAGNOSIS — D509 Iron deficiency anemia, unspecified: Secondary | ICD-10-CM | POA: Diagnosis not present

## 2020-08-30 DIAGNOSIS — N2581 Secondary hyperparathyroidism of renal origin: Secondary | ICD-10-CM | POA: Diagnosis not present

## 2020-08-30 DIAGNOSIS — Z992 Dependence on renal dialysis: Secondary | ICD-10-CM | POA: Diagnosis not present

## 2020-08-30 DIAGNOSIS — D689 Coagulation defect, unspecified: Secondary | ICD-10-CM | POA: Diagnosis not present

## 2020-08-30 DIAGNOSIS — N186 End stage renal disease: Secondary | ICD-10-CM | POA: Diagnosis not present

## 2020-08-31 DIAGNOSIS — Z9889 Other specified postprocedural states: Secondary | ICD-10-CM | POA: Diagnosis not present

## 2020-08-31 DIAGNOSIS — Z992 Dependence on renal dialysis: Secondary | ICD-10-CM | POA: Diagnosis not present

## 2020-08-31 DIAGNOSIS — Z8673 Personal history of transient ischemic attack (TIA), and cerebral infarction without residual deficits: Secondary | ICD-10-CM | POA: Diagnosis not present

## 2020-08-31 DIAGNOSIS — I272 Pulmonary hypertension, unspecified: Secondary | ICD-10-CM | POA: Diagnosis not present

## 2020-08-31 DIAGNOSIS — Z48812 Encounter for surgical aftercare following surgery on the circulatory system: Secondary | ICD-10-CM | POA: Diagnosis not present

## 2020-08-31 DIAGNOSIS — Z01810 Encounter for preprocedural cardiovascular examination: Secondary | ICD-10-CM | POA: Diagnosis not present

## 2020-08-31 DIAGNOSIS — Z79899 Other long term (current) drug therapy: Secondary | ICD-10-CM | POA: Diagnosis not present

## 2020-08-31 DIAGNOSIS — Z888 Allergy status to other drugs, medicaments and biological substances status: Secondary | ICD-10-CM | POA: Diagnosis not present

## 2020-08-31 DIAGNOSIS — I5030 Unspecified diastolic (congestive) heart failure: Secondary | ICD-10-CM | POA: Diagnosis not present

## 2020-08-31 DIAGNOSIS — I2510 Atherosclerotic heart disease of native coronary artery without angina pectoris: Secondary | ICD-10-CM | POA: Diagnosis not present

## 2020-08-31 DIAGNOSIS — E1122 Type 2 diabetes mellitus with diabetic chronic kidney disease: Secondary | ICD-10-CM | POA: Diagnosis not present

## 2020-08-31 DIAGNOSIS — I120 Hypertensive chronic kidney disease with stage 5 chronic kidney disease or end stage renal disease: Secondary | ICD-10-CM | POA: Diagnosis not present

## 2020-08-31 DIAGNOSIS — N186 End stage renal disease: Secondary | ICD-10-CM | POA: Diagnosis not present

## 2020-08-31 DIAGNOSIS — I132 Hypertensive heart and chronic kidney disease with heart failure and with stage 5 chronic kidney disease, or end stage renal disease: Secondary | ICD-10-CM | POA: Diagnosis not present

## 2020-09-01 DIAGNOSIS — Z23 Encounter for immunization: Secondary | ICD-10-CM | POA: Diagnosis not present

## 2020-09-01 DIAGNOSIS — D689 Coagulation defect, unspecified: Secondary | ICD-10-CM | POA: Diagnosis not present

## 2020-09-01 DIAGNOSIS — N2581 Secondary hyperparathyroidism of renal origin: Secondary | ICD-10-CM | POA: Diagnosis not present

## 2020-09-01 DIAGNOSIS — E118 Type 2 diabetes mellitus with unspecified complications: Secondary | ICD-10-CM | POA: Diagnosis not present

## 2020-09-01 DIAGNOSIS — D631 Anemia in chronic kidney disease: Secondary | ICD-10-CM | POA: Diagnosis not present

## 2020-09-01 DIAGNOSIS — D509 Iron deficiency anemia, unspecified: Secondary | ICD-10-CM | POA: Diagnosis not present

## 2020-09-01 DIAGNOSIS — Z992 Dependence on renal dialysis: Secondary | ICD-10-CM | POA: Diagnosis not present

## 2020-09-01 DIAGNOSIS — N186 End stage renal disease: Secondary | ICD-10-CM | POA: Diagnosis not present

## 2020-09-03 DIAGNOSIS — E118 Type 2 diabetes mellitus with unspecified complications: Secondary | ICD-10-CM | POA: Diagnosis not present

## 2020-09-03 DIAGNOSIS — N186 End stage renal disease: Secondary | ICD-10-CM | POA: Diagnosis not present

## 2020-09-03 DIAGNOSIS — D509 Iron deficiency anemia, unspecified: Secondary | ICD-10-CM | POA: Diagnosis not present

## 2020-09-03 DIAGNOSIS — D631 Anemia in chronic kidney disease: Secondary | ICD-10-CM | POA: Diagnosis not present

## 2020-09-03 DIAGNOSIS — Z23 Encounter for immunization: Secondary | ICD-10-CM | POA: Diagnosis not present

## 2020-09-03 DIAGNOSIS — Z992 Dependence on renal dialysis: Secondary | ICD-10-CM | POA: Diagnosis not present

## 2020-09-03 DIAGNOSIS — D689 Coagulation defect, unspecified: Secondary | ICD-10-CM | POA: Diagnosis not present

## 2020-09-03 DIAGNOSIS — N2581 Secondary hyperparathyroidism of renal origin: Secondary | ICD-10-CM | POA: Diagnosis not present

## 2020-09-06 DIAGNOSIS — N186 End stage renal disease: Secondary | ICD-10-CM | POA: Diagnosis not present

## 2020-09-06 DIAGNOSIS — E118 Type 2 diabetes mellitus with unspecified complications: Secondary | ICD-10-CM | POA: Diagnosis not present

## 2020-09-06 DIAGNOSIS — N2581 Secondary hyperparathyroidism of renal origin: Secondary | ICD-10-CM | POA: Diagnosis not present

## 2020-09-06 DIAGNOSIS — Z23 Encounter for immunization: Secondary | ICD-10-CM | POA: Diagnosis not present

## 2020-09-06 DIAGNOSIS — Z992 Dependence on renal dialysis: Secondary | ICD-10-CM | POA: Diagnosis not present

## 2020-09-06 DIAGNOSIS — D631 Anemia in chronic kidney disease: Secondary | ICD-10-CM | POA: Diagnosis not present

## 2020-09-06 DIAGNOSIS — D689 Coagulation defect, unspecified: Secondary | ICD-10-CM | POA: Diagnosis not present

## 2020-09-06 DIAGNOSIS — D509 Iron deficiency anemia, unspecified: Secondary | ICD-10-CM | POA: Diagnosis not present

## 2020-09-08 DIAGNOSIS — Z23 Encounter for immunization: Secondary | ICD-10-CM | POA: Diagnosis not present

## 2020-09-08 DIAGNOSIS — N186 End stage renal disease: Secondary | ICD-10-CM | POA: Diagnosis not present

## 2020-09-08 DIAGNOSIS — D631 Anemia in chronic kidney disease: Secondary | ICD-10-CM | POA: Diagnosis not present

## 2020-09-08 DIAGNOSIS — E118 Type 2 diabetes mellitus with unspecified complications: Secondary | ICD-10-CM | POA: Diagnosis not present

## 2020-09-08 DIAGNOSIS — N2581 Secondary hyperparathyroidism of renal origin: Secondary | ICD-10-CM | POA: Diagnosis not present

## 2020-09-08 DIAGNOSIS — D509 Iron deficiency anemia, unspecified: Secondary | ICD-10-CM | POA: Diagnosis not present

## 2020-09-08 DIAGNOSIS — Z992 Dependence on renal dialysis: Secondary | ICD-10-CM | POA: Diagnosis not present

## 2020-09-08 DIAGNOSIS — D689 Coagulation defect, unspecified: Secondary | ICD-10-CM | POA: Diagnosis not present

## 2020-09-10 DIAGNOSIS — N2581 Secondary hyperparathyroidism of renal origin: Secondary | ICD-10-CM | POA: Diagnosis not present

## 2020-09-10 DIAGNOSIS — N186 End stage renal disease: Secondary | ICD-10-CM | POA: Diagnosis not present

## 2020-09-10 DIAGNOSIS — Z992 Dependence on renal dialysis: Secondary | ICD-10-CM | POA: Diagnosis not present

## 2020-09-10 DIAGNOSIS — E118 Type 2 diabetes mellitus with unspecified complications: Secondary | ICD-10-CM | POA: Diagnosis not present

## 2020-09-10 DIAGNOSIS — D509 Iron deficiency anemia, unspecified: Secondary | ICD-10-CM | POA: Diagnosis not present

## 2020-09-10 DIAGNOSIS — Z23 Encounter for immunization: Secondary | ICD-10-CM | POA: Diagnosis not present

## 2020-09-10 DIAGNOSIS — D689 Coagulation defect, unspecified: Secondary | ICD-10-CM | POA: Diagnosis not present

## 2020-09-10 DIAGNOSIS — D631 Anemia in chronic kidney disease: Secondary | ICD-10-CM | POA: Diagnosis not present

## 2020-09-13 DIAGNOSIS — Z23 Encounter for immunization: Secondary | ICD-10-CM | POA: Diagnosis not present

## 2020-09-13 DIAGNOSIS — D689 Coagulation defect, unspecified: Secondary | ICD-10-CM | POA: Diagnosis not present

## 2020-09-13 DIAGNOSIS — E118 Type 2 diabetes mellitus with unspecified complications: Secondary | ICD-10-CM | POA: Diagnosis not present

## 2020-09-13 DIAGNOSIS — N2581 Secondary hyperparathyroidism of renal origin: Secondary | ICD-10-CM | POA: Diagnosis not present

## 2020-09-13 DIAGNOSIS — D631 Anemia in chronic kidney disease: Secondary | ICD-10-CM | POA: Diagnosis not present

## 2020-09-13 DIAGNOSIS — N186 End stage renal disease: Secondary | ICD-10-CM | POA: Diagnosis not present

## 2020-09-13 DIAGNOSIS — D509 Iron deficiency anemia, unspecified: Secondary | ICD-10-CM | POA: Diagnosis not present

## 2020-09-13 DIAGNOSIS — Z992 Dependence on renal dialysis: Secondary | ICD-10-CM | POA: Diagnosis not present

## 2020-09-15 DIAGNOSIS — N2581 Secondary hyperparathyroidism of renal origin: Secondary | ICD-10-CM | POA: Diagnosis not present

## 2020-09-15 DIAGNOSIS — D631 Anemia in chronic kidney disease: Secondary | ICD-10-CM | POA: Diagnosis not present

## 2020-09-15 DIAGNOSIS — E118 Type 2 diabetes mellitus with unspecified complications: Secondary | ICD-10-CM | POA: Diagnosis not present

## 2020-09-15 DIAGNOSIS — D689 Coagulation defect, unspecified: Secondary | ICD-10-CM | POA: Diagnosis not present

## 2020-09-15 DIAGNOSIS — N186 End stage renal disease: Secondary | ICD-10-CM | POA: Diagnosis not present

## 2020-09-15 DIAGNOSIS — Z992 Dependence on renal dialysis: Secondary | ICD-10-CM | POA: Diagnosis not present

## 2020-09-15 DIAGNOSIS — D509 Iron deficiency anemia, unspecified: Secondary | ICD-10-CM | POA: Diagnosis not present

## 2020-09-15 DIAGNOSIS — Z23 Encounter for immunization: Secondary | ICD-10-CM | POA: Diagnosis not present

## 2020-09-17 DIAGNOSIS — N186 End stage renal disease: Secondary | ICD-10-CM | POA: Diagnosis not present

## 2020-09-17 DIAGNOSIS — N2581 Secondary hyperparathyroidism of renal origin: Secondary | ICD-10-CM | POA: Diagnosis not present

## 2020-09-17 DIAGNOSIS — D689 Coagulation defect, unspecified: Secondary | ICD-10-CM | POA: Diagnosis not present

## 2020-09-17 DIAGNOSIS — D509 Iron deficiency anemia, unspecified: Secondary | ICD-10-CM | POA: Diagnosis not present

## 2020-09-17 DIAGNOSIS — D631 Anemia in chronic kidney disease: Secondary | ICD-10-CM | POA: Diagnosis not present

## 2020-09-17 DIAGNOSIS — E118 Type 2 diabetes mellitus with unspecified complications: Secondary | ICD-10-CM | POA: Diagnosis not present

## 2020-09-17 DIAGNOSIS — Z23 Encounter for immunization: Secondary | ICD-10-CM | POA: Diagnosis not present

## 2020-09-17 DIAGNOSIS — Z992 Dependence on renal dialysis: Secondary | ICD-10-CM | POA: Diagnosis not present

## 2020-09-20 DIAGNOSIS — N186 End stage renal disease: Secondary | ICD-10-CM | POA: Diagnosis not present

## 2020-09-20 DIAGNOSIS — D509 Iron deficiency anemia, unspecified: Secondary | ICD-10-CM | POA: Diagnosis not present

## 2020-09-20 DIAGNOSIS — D689 Coagulation defect, unspecified: Secondary | ICD-10-CM | POA: Diagnosis not present

## 2020-09-20 DIAGNOSIS — E118 Type 2 diabetes mellitus with unspecified complications: Secondary | ICD-10-CM | POA: Diagnosis not present

## 2020-09-20 DIAGNOSIS — D631 Anemia in chronic kidney disease: Secondary | ICD-10-CM | POA: Diagnosis not present

## 2020-09-20 DIAGNOSIS — N2581 Secondary hyperparathyroidism of renal origin: Secondary | ICD-10-CM | POA: Diagnosis not present

## 2020-09-20 DIAGNOSIS — Z992 Dependence on renal dialysis: Secondary | ICD-10-CM | POA: Diagnosis not present

## 2020-09-20 DIAGNOSIS — Z23 Encounter for immunization: Secondary | ICD-10-CM | POA: Diagnosis not present

## 2020-09-22 DIAGNOSIS — E118 Type 2 diabetes mellitus with unspecified complications: Secondary | ICD-10-CM | POA: Diagnosis not present

## 2020-09-22 DIAGNOSIS — N2581 Secondary hyperparathyroidism of renal origin: Secondary | ICD-10-CM | POA: Diagnosis not present

## 2020-09-22 DIAGNOSIS — N186 End stage renal disease: Secondary | ICD-10-CM | POA: Diagnosis not present

## 2020-09-22 DIAGNOSIS — D631 Anemia in chronic kidney disease: Secondary | ICD-10-CM | POA: Diagnosis not present

## 2020-09-22 DIAGNOSIS — D689 Coagulation defect, unspecified: Secondary | ICD-10-CM | POA: Diagnosis not present

## 2020-09-22 DIAGNOSIS — Z992 Dependence on renal dialysis: Secondary | ICD-10-CM | POA: Diagnosis not present

## 2020-09-22 DIAGNOSIS — Z23 Encounter for immunization: Secondary | ICD-10-CM | POA: Diagnosis not present

## 2020-09-22 DIAGNOSIS — D509 Iron deficiency anemia, unspecified: Secondary | ICD-10-CM | POA: Diagnosis not present

## 2020-09-23 DIAGNOSIS — Z7901 Long term (current) use of anticoagulants: Secondary | ICD-10-CM | POA: Diagnosis not present

## 2020-09-23 DIAGNOSIS — Z5181 Encounter for therapeutic drug level monitoring: Secondary | ICD-10-CM | POA: Diagnosis not present

## 2020-09-24 DIAGNOSIS — D631 Anemia in chronic kidney disease: Secondary | ICD-10-CM | POA: Diagnosis not present

## 2020-09-24 DIAGNOSIS — E118 Type 2 diabetes mellitus with unspecified complications: Secondary | ICD-10-CM | POA: Diagnosis not present

## 2020-09-24 DIAGNOSIS — N186 End stage renal disease: Secondary | ICD-10-CM | POA: Diagnosis not present

## 2020-09-24 DIAGNOSIS — D509 Iron deficiency anemia, unspecified: Secondary | ICD-10-CM | POA: Diagnosis not present

## 2020-09-24 DIAGNOSIS — Z23 Encounter for immunization: Secondary | ICD-10-CM | POA: Diagnosis not present

## 2020-09-24 DIAGNOSIS — N2581 Secondary hyperparathyroidism of renal origin: Secondary | ICD-10-CM | POA: Diagnosis not present

## 2020-09-24 DIAGNOSIS — D689 Coagulation defect, unspecified: Secondary | ICD-10-CM | POA: Diagnosis not present

## 2020-09-24 DIAGNOSIS — Z992 Dependence on renal dialysis: Secondary | ICD-10-CM | POA: Diagnosis not present

## 2020-09-26 DIAGNOSIS — E1129 Type 2 diabetes mellitus with other diabetic kidney complication: Secondary | ICD-10-CM | POA: Diagnosis not present

## 2020-09-26 DIAGNOSIS — N186 End stage renal disease: Secondary | ICD-10-CM | POA: Diagnosis not present

## 2020-09-26 DIAGNOSIS — Z992 Dependence on renal dialysis: Secondary | ICD-10-CM | POA: Diagnosis not present

## 2020-09-27 DIAGNOSIS — E118 Type 2 diabetes mellitus with unspecified complications: Secondary | ICD-10-CM | POA: Diagnosis not present

## 2020-09-27 DIAGNOSIS — Z992 Dependence on renal dialysis: Secondary | ICD-10-CM | POA: Diagnosis not present

## 2020-09-27 DIAGNOSIS — N2581 Secondary hyperparathyroidism of renal origin: Secondary | ICD-10-CM | POA: Diagnosis not present

## 2020-09-27 DIAGNOSIS — D689 Coagulation defect, unspecified: Secondary | ICD-10-CM | POA: Diagnosis not present

## 2020-09-27 DIAGNOSIS — N186 End stage renal disease: Secondary | ICD-10-CM | POA: Diagnosis not present

## 2020-09-27 DIAGNOSIS — D509 Iron deficiency anemia, unspecified: Secondary | ICD-10-CM | POA: Diagnosis not present

## 2020-09-27 DIAGNOSIS — D631 Anemia in chronic kidney disease: Secondary | ICD-10-CM | POA: Diagnosis not present

## 2020-09-28 ENCOUNTER — Telehealth: Payer: Self-pay | Admitting: Cardiology

## 2020-09-28 NOTE — Telephone Encounter (Signed)
Pt c/o Shortness Of Breath: STAT if SOB developed within the last 24 hours or pt is noticeably SOB on the phone  1. Are you currently SOB (can you hear that pt is SOB on the phone)? A little   2. How long have you been experiencing SOB?  About 2 weeks, getting worse  3. Are you SOB when sitting or when up moving around?  Most at night, it wakes her up  4. Are you currently experiencing any other symptoms? No- pt wants to be seen- I made an appt for Monday(10-04-20)

## 2020-09-28 NOTE — Telephone Encounter (Signed)
Patient on dialysis and therefore volume cannot be managed with diuretics.  Agree with follow-up in the office as scheduled to further assess. [name]

## 2020-09-28 NOTE — Telephone Encounter (Signed)
Spoke with pt, she reports SOB that started about 2 weeks ago and seems to be getting worse. She reports waking in the night feeling SOB and having to sit up in bed. She also has SOB when walking up stairs, no SOB with exertion in her home. She denies swelling, and reports her weight is down. She has no chest pain or palpitations. She has an appointment Monday. Will forward for dr Stanford Breed review

## 2020-09-28 NOTE — Telephone Encounter (Signed)
Spoke with pt, Aware of dr Jacalyn Lefevre recommendations. She reports she does have a cold but no cough or drainage. She will keep the follow up Monday and will touch base with her medical doctor.

## 2020-09-29 DIAGNOSIS — N186 End stage renal disease: Secondary | ICD-10-CM | POA: Diagnosis not present

## 2020-09-29 DIAGNOSIS — Z992 Dependence on renal dialysis: Secondary | ICD-10-CM | POA: Diagnosis not present

## 2020-09-29 DIAGNOSIS — D509 Iron deficiency anemia, unspecified: Secondary | ICD-10-CM | POA: Diagnosis not present

## 2020-09-29 DIAGNOSIS — D689 Coagulation defect, unspecified: Secondary | ICD-10-CM | POA: Diagnosis not present

## 2020-09-29 DIAGNOSIS — N2581 Secondary hyperparathyroidism of renal origin: Secondary | ICD-10-CM | POA: Diagnosis not present

## 2020-09-29 DIAGNOSIS — E118 Type 2 diabetes mellitus with unspecified complications: Secondary | ICD-10-CM | POA: Diagnosis not present

## 2020-09-29 DIAGNOSIS — D631 Anemia in chronic kidney disease: Secondary | ICD-10-CM | POA: Diagnosis not present

## 2020-10-01 DIAGNOSIS — N186 End stage renal disease: Secondary | ICD-10-CM | POA: Diagnosis not present

## 2020-10-01 DIAGNOSIS — N2581 Secondary hyperparathyroidism of renal origin: Secondary | ICD-10-CM | POA: Diagnosis not present

## 2020-10-01 DIAGNOSIS — D509 Iron deficiency anemia, unspecified: Secondary | ICD-10-CM | POA: Diagnosis not present

## 2020-10-01 DIAGNOSIS — Z992 Dependence on renal dialysis: Secondary | ICD-10-CM | POA: Diagnosis not present

## 2020-10-01 DIAGNOSIS — Z7901 Long term (current) use of anticoagulants: Secondary | ICD-10-CM | POA: Diagnosis not present

## 2020-10-01 DIAGNOSIS — Z5181 Encounter for therapeutic drug level monitoring: Secondary | ICD-10-CM | POA: Diagnosis not present

## 2020-10-01 DIAGNOSIS — D631 Anemia in chronic kidney disease: Secondary | ICD-10-CM | POA: Diagnosis not present

## 2020-10-01 DIAGNOSIS — E118 Type 2 diabetes mellitus with unspecified complications: Secondary | ICD-10-CM | POA: Diagnosis not present

## 2020-10-01 DIAGNOSIS — D689 Coagulation defect, unspecified: Secondary | ICD-10-CM | POA: Diagnosis not present

## 2020-10-01 DIAGNOSIS — I272 Pulmonary hypertension, unspecified: Secondary | ICD-10-CM | POA: Diagnosis not present

## 2020-10-04 ENCOUNTER — Ambulatory Visit: Payer: Medicare Other | Admitting: Cardiology

## 2020-10-04 DIAGNOSIS — Z992 Dependence on renal dialysis: Secondary | ICD-10-CM | POA: Diagnosis not present

## 2020-10-04 DIAGNOSIS — D509 Iron deficiency anemia, unspecified: Secondary | ICD-10-CM | POA: Diagnosis not present

## 2020-10-04 DIAGNOSIS — D689 Coagulation defect, unspecified: Secondary | ICD-10-CM | POA: Diagnosis not present

## 2020-10-04 DIAGNOSIS — E118 Type 2 diabetes mellitus with unspecified complications: Secondary | ICD-10-CM | POA: Diagnosis not present

## 2020-10-04 DIAGNOSIS — D631 Anemia in chronic kidney disease: Secondary | ICD-10-CM | POA: Diagnosis not present

## 2020-10-04 DIAGNOSIS — N2581 Secondary hyperparathyroidism of renal origin: Secondary | ICD-10-CM | POA: Diagnosis not present

## 2020-10-04 DIAGNOSIS — N186 End stage renal disease: Secondary | ICD-10-CM | POA: Diagnosis not present

## 2020-10-04 NOTE — Progress Notes (Deleted)
HPI: Follow-up hypertension, diastolic heart failure and previous mitral valve repair. Renal dopplers Nov 2013 showed no RAS. Note previous pericardial effusion with minoxidil. Transesophageal echocardiogram July 2016 showed normal LV function, moderate to severe left atrial enlargement, small pericardial effusion, flail segment of anterior mitral valve leaflet with severe mitral regurgitation and mild tricuspid regurgitation. Cardiac catheterization September 2016 showed nonobstructive coronary disease and severely elevated pulmonary pressures. Carotid Dopplers August 2016 showed 1-39% bilateral stenosis. Patient had mitral valve repair in August 2016. Patient's course was complicated by acute hypoxic respiratory failure refractory to conventional ventilatory support. She was transferred to [facility] for ECMO. Course was complicated by need for tracheostomy. She also developed acute on chronic renal failure and now requires dialysis. Nuclear study January 2020 showed ejection fraction 57% and no scar or ischemia.  Echocardiogram February 2020 at [facility] showed normal LV function, prior mitral valve repair with mild mitral regurgitation and mean gradient 5 mmHg, mild to moderate left atrial enlargement, mild right atrial enlargement, moderate tricuspid regurgitation.  Right heart catheterization at [facility] November 2020 showed RA pressure of 9, RV pressure 55/8, PA pressure 55/25 and pulmonary capillary wedge pressure of 18.  Followed at [facility] for pulmonary hypertension as she was being considered for renal transplant.    Recently contacted the office with increased dyspnea.  Since last seen,  Current Outpatient Medications  Medication Sig Dispense Refill  . acetaminophen (TYLENOL) 500 MG tablet Take 1,000 mg by mouth every 6 (six) hours as needed (for pain.).    Marland Kitchen aspirin EC 81 MG tablet Take 81 mg by mouth daily.    Marland Kitchen azelastine (ASTELIN) 0.1 % nasal spray Use 1 to 2 sprays each nostril 2 to  3 x / day 90 mL 3  . busPIRone (BUSPAR) 10 MG tablet Take 1 tablet 2 x /day for Anxiety 180 tablet 1  . carvedilol (COREG) 25 MG tablet Take 1 tablet 2 x /day for BP & Heart 180 tablet 0  . cinacalcet (SENSIPAR) 60 MG tablet Take 1 tablet (60 mg total) by mouth daily. 90 tablet 1  . hydrALAZINE (APRESOLINE) 25 MG tablet TAKE 1 TABLET BY MOUTH 3  TIMES DAILY FOR BLOOD  PRESSURE AND HEART 270 tablet 3  . insulin aspart protamine - aspart (NOVOLOG MIX 70/30 FLEXPEN) (70-30) 100 UNIT/ML FlexPen Inject 0.1 mLs (10 Units total) into the skin 2 (two) times daily. (Patient taking differently: Inject 10 Units into the skin 2 (two) times daily as needed (if blood sugar is above 180). ) 15 mL 3  . latanoprost (XALATAN) 0.005 % ophthalmic solution Place 1 drop into both eyes daily.    Marland Kitchen losartan (COZAAR) 50 MG tablet Take 1 tablet Daily for BP 90 tablet 1  . Multiple Vitamins-Minerals (ZINC PO) Take by mouth.    . ondansetron (ZOFRAN ODT) 4 MG disintegrating tablet Take 1 tablet (4 mg total) by mouth every 8 (eight) hours as needed for nausea or vomiting. 10 tablet 0  . RENVELA 800 MG tablet Take 1,600-4,000 mg by mouth 3 (three) times daily. 4000 mg (5 caps) with meals and 1600 mg (2 caps) with snacks  5  . rosuvastatin (CRESTOR) 20 MG tablet Take 1 tablet Daily for Cholesterol 90 tablet 3  . sertraline (ZOLOFT) 25 MG tablet Take 1 tablet Daily for Mood 90 tablet 3  . sildenafil (REVATIO) 20 MG tablet Take 2 tablets (40 mg) 3 x  /day fr Pulmonary Hypertension 540 tablet 3  No current facility-administered medications for this visit.     Past Medical History:  Diagnosis Date  . Anemia   . Chronic diastolic CHF (congestive heart failure) ([place])   . Chronic diastolic heart failure ([place])   . CKD (chronic kidney disease) stage 4, GFR 15-29 ml/min (HCC)    dialysis M/W/F  . Constipation   . CVA (cerebral infarction) 1997   no residual deficit  . Diverticulitis   . DM (diabetes mellitus) ([place])    type 2   . Heart murmur   . History of cardiovascular stress test    a. Myoview Oct 2012 showed EF 49%, no ischemia, LVE  . HLD (hyperlipidemia)    takes Crestor daily  . Hypertension   . Pericardial effusion    chronic; felt to be poss related to minoxidil >> DC'd  . Pulmonary hypertension ([place]) 06/18/2015  . Respiratory failure, acute hypoxic, post-operative 07/08/2015   Requiring ECMO support  . S/P cardiac catheterization    a. R/L [facility] 06/18/15:  mLAD 30%; severe pulmo HTN with PA sat 43%, CI 1.86, prominent V waves indicative of MR; resting hypoxemia O2 sat 86% on RA  . S/P minimally invasive mitral valve repair 07/08/2015   Complex valvuloplasty including artificial Gore-tex neochord placement x10 and 26 mm Sorin Memo 3D Rechord ring annuloplasty via right mini thoracotomy approach  . Severe mitral regurgitation   . Shortness of breath dyspnea   . Stroke ([place]) 1997   no residual effect  . Vitamin D deficiency   . Wears glasses     Past Surgical History:  Procedure Laterality Date  . A/V FISTULAGRAM Right 08/16/2017   Procedure: A/V Fistulagram;  Surgeon: Conrad Bismarck, MD;  Location: [place] [facility];  Service: Cardiovascular;  Laterality: Right;  . AV FISTULA PLACEMENT Left 10/22/2015   Procedure: RADIOCEPHALIC ARTERIOVENOUS (AV) FISTULA CREATION;  Surgeon: Serafina Mitchell, MD;  Location: [place];  Service: Vascular;  Laterality: Left;  . AV FISTULA PLACEMENT Right 05/22/2017   Procedure: ARTERIOVENOUS (AV) FISTULA CREATION-RIGHT ARM;  Surgeon: Waynetta Sandy, MD;  Location: [place];  Service: Vascular;  Laterality: Right;  . CANNULATION FOR CARDIOPULMONARY BYPASS N/A 07/08/2015   Procedure: CANNULATION FOR ECMO;  Surgeon: Rexene Alberts, MD;  Location: [place];  Service: Open Heart Surgery;  Laterality: N/A;  . CARDIAC CATHETERIZATION    . CARDIAC CATHETERIZATION N/A 06/18/2015   Procedure: Right/Left Heart Cath and Coronary Angiography;  Surgeon: Jettie Booze, MD;   Location: [place] [facility];  Service: Cardiovascular;  Laterality: N/A;  . CESAREAN SECTION     x 2  . COLONOSCOPY    . EXCHANGE OF A DIALYSIS CATHETER N/A 06/28/2017   Procedure: EXCHANGE OF A DIALYSIS CATHETER;  Surgeon: Waynetta Sandy, MD;  Location: [place];  Service: Vascular;  Laterality: N/A;  . FISTULA SUPERFICIALIZATION Left 12/28/2015   Procedure: SUPERFICIALIZATION LEFT RADIOCEPHALIC FISTULA;  Surgeon: Serafina Mitchell, MD;  Location: [place];  Service: Vascular;  Laterality: Left;  . FISTULA SUPERFICIALIZATION Right 08/21/2017   Procedure: FISTULA SUPERFICIALIZATION RIGHT ARM;  Surgeon: Waynetta Sandy, MD;  Location: [place];  Service: Vascular;  Laterality: Right;  . INSERTION OF DIALYSIS CATHETER Right 04/28/2017   Procedure: INSERTION OF DIALYSIS CATHETER-RIGHT INTERNAL JUGULAR PLACEMENT;  Surgeon: Elam Dutch, MD;  Location: [place];  Service: Vascular;  Laterality: Right;  . LIGATION OF ARTERIOVENOUS  FISTULA Left 04/28/2017   Procedure: LIGATION OF ARTERIOVENOUS  FISTULA;  Surgeon: Ruta Hinds  E, MD;  Location: [place];  Service: Vascular;  Laterality: Left;  . MITRAL VALVE REPAIR Right 07/08/2015   Procedure: MINIMALLY INVASIVE MITRAL VALVE REPAIR with a 26 Sorin Memo 3D Rechord;  Surgeon: Rexene Alberts, MD;  Location: [place];  Service: Open Heart Surgery;  Laterality: Right;  . MULTIPLE EXTRACTIONS WITH ALVEOLOPLASTY N/A 06/30/2015   Procedure: MULTIPLE EXTRACTIONS OF TOOTH #'S 4  AND 30 WITH ALVEOLOPLASTY AND GROSS DEBRIDEMENT  OF REMAINING TEETH;  Surgeon: Lenn Cal, DDS;  Location: [place];  Service: Oral Surgery;  Laterality: N/A;  . PERIPHERAL VASCULAR CATHETERIZATION Left 03/28/2016   Procedure: A/V Fistulagram;  Surgeon: Serafina Mitchell, MD;  Location: [place] [facility];  Service: Cardiovascular;  Laterality: Left;  lower arm  . TEE WITHOUT CARDIOVERSION N/A 06/08/2015   Procedure: TRANSESOPHAGEAL ECHOCARDIOGRAM (TEE);  Surgeon: Lelon Perla, MD;   Location: [facility] ENDOSCOPY;  Service: Cardiovascular;  Laterality: N/A;  . TEE WITHOUT CARDIOVERSION N/A 07/08/2015   Procedure: TRANSESOPHAGEAL ECHOCARDIOGRAM (TEE);  Surgeon: Rexene Alberts, MD;  Location: [place];  Service: Open Heart Surgery;  Laterality: N/A;    Social History   Socioeconomic History  . Marital status: Married    Spouse name: Not on file  . Number of children: 3  . Years of education: Not on file  . Highest education level: Not on file  Occupational History    Comment: Unemployed; used to work as a Radio broadcast assistant  Tobacco Use  . Smoking status: Never Smoker  . Smokeless tobacco: Never Used  Vaping Use  . Vaping Use: Never used  Substance and Sexual Activity  . Alcohol use: No    Alcohol/week: 0.0 standard drinks  . Drug use: No  . Sexual activity: Not on file  Other Topics Concern  . Not on file  Social History Narrative  . Not on file   Social Determinants of Health   Financial Resource Strain:   . Difficulty of Paying Living Expenses: Not on file  Food Insecurity:   . Worried About Charity fundraiser in the Last Year: Not on file  . Ran Out of Food in the Last Year: Not on file  Transportation Needs:   . Lack of Transportation (Medical): Not on file  . Lack of Transportation (Non-Medical): Not on file  Physical Activity:   . Days of Exercise per Week: Not on file  . Minutes of Exercise per Session: Not on file  Stress:   . Feeling of Stress : Not on file  Social Connections:   . Frequency of Communication with Friends and Family: Not on file  . Frequency of Social Gatherings with Friends and Family: Not on file  . Attends Religious Services: Not on file  . Active Member of Clubs or Organizations: Not on file  . Attends Archivist Meetings: Not on file  . Marital Status: Not on file  Intimate Partner Violence:   . Fear of Current or Ex-Partner: Not on file  . Emotionally Abused: Not on file  . Physically Abused: Not on file   . Sexually Abused: Not on file    Family History  Problem Relation Age of Onset  . Cancer Sister        breast cancer  . Stroke Mother   . Heart attack Brother        MI in his 10s  . Heart disease Brother        before age 40  . Breast  cancer Sister     ROS: no fevers or chills, productive cough, hemoptysis, dysphasia, odynophagia, melena, hematochezia, dysuria, hematuria, rash, seizure activity, orthopnea, PND, pedal edema, claudication. Remaining systems are negative.  Physical Exam: Well-developed well-nourished in no acute distress.  Skin is warm and dry.  HEENT is normal.  Neck is supple.  Chest is clear to auscultation with normal expansion.  Cardiovascular exam is regular rate and rhythm.  Abdominal exam nontender or distended. No masses palpated. Extremities show no edema. neuro grossly intact  ECG- personally reviewed  A/P  1 dyspnea-  2 prior mitral valve repair-continue SBE prophylaxis.  We will plan to repeat echocardiogram given complaints of dyspnea.  3 chronic diastolic congestive heart failure-volume managed by nephrology/dialysis.  4 pulmonary hypertension-this is felt predominantly secondary to pulmonary venous hypertension.  Volume management per dialysis.  5 hypertension-blood pressure is controlled.  Continue present medical regimen.  6 coronary artery disease-she denies chest pain.  Continue aspirin and statin.  7 hyperlipidemia-continue statin.  8 history of cardiomyopathy-LV function has improved on most recent echocardiogram.  Given dyspnea we are repeating study.  9 end-stage renal disease-Per nephrology.  [name], MD

## 2020-10-06 DIAGNOSIS — N186 End stage renal disease: Secondary | ICD-10-CM | POA: Diagnosis not present

## 2020-10-06 DIAGNOSIS — D631 Anemia in chronic kidney disease: Secondary | ICD-10-CM | POA: Diagnosis not present

## 2020-10-06 DIAGNOSIS — Z992 Dependence on renal dialysis: Secondary | ICD-10-CM | POA: Diagnosis not present

## 2020-10-06 DIAGNOSIS — D509 Iron deficiency anemia, unspecified: Secondary | ICD-10-CM | POA: Diagnosis not present

## 2020-10-06 DIAGNOSIS — N2581 Secondary hyperparathyroidism of renal origin: Secondary | ICD-10-CM | POA: Diagnosis not present

## 2020-10-06 DIAGNOSIS — E118 Type 2 diabetes mellitus with unspecified complications: Secondary | ICD-10-CM | POA: Diagnosis not present

## 2020-10-06 DIAGNOSIS — D689 Coagulation defect, unspecified: Secondary | ICD-10-CM | POA: Diagnosis not present

## 2020-10-08 DIAGNOSIS — Z992 Dependence on renal dialysis: Secondary | ICD-10-CM | POA: Diagnosis not present

## 2020-10-08 DIAGNOSIS — N186 End stage renal disease: Secondary | ICD-10-CM | POA: Diagnosis not present

## 2020-10-08 DIAGNOSIS — N2581 Secondary hyperparathyroidism of renal origin: Secondary | ICD-10-CM | POA: Diagnosis not present

## 2020-10-08 DIAGNOSIS — Z5181 Encounter for therapeutic drug level monitoring: Secondary | ICD-10-CM | POA: Diagnosis not present

## 2020-10-08 DIAGNOSIS — E118 Type 2 diabetes mellitus with unspecified complications: Secondary | ICD-10-CM | POA: Diagnosis not present

## 2020-10-08 DIAGNOSIS — D689 Coagulation defect, unspecified: Secondary | ICD-10-CM | POA: Diagnosis not present

## 2020-10-08 DIAGNOSIS — D509 Iron deficiency anemia, unspecified: Secondary | ICD-10-CM | POA: Diagnosis not present

## 2020-10-08 DIAGNOSIS — D631 Anemia in chronic kidney disease: Secondary | ICD-10-CM | POA: Diagnosis not present

## 2020-10-08 DIAGNOSIS — Z7901 Long term (current) use of anticoagulants: Secondary | ICD-10-CM | POA: Diagnosis not present

## 2020-10-08 DIAGNOSIS — I272 Pulmonary hypertension, unspecified: Secondary | ICD-10-CM | POA: Diagnosis not present

## 2020-10-11 DIAGNOSIS — D509 Iron deficiency anemia, unspecified: Secondary | ICD-10-CM | POA: Diagnosis not present

## 2020-10-11 DIAGNOSIS — N2581 Secondary hyperparathyroidism of renal origin: Secondary | ICD-10-CM | POA: Diagnosis not present

## 2020-10-11 DIAGNOSIS — N186 End stage renal disease: Secondary | ICD-10-CM | POA: Diagnosis not present

## 2020-10-11 DIAGNOSIS — Z992 Dependence on renal dialysis: Secondary | ICD-10-CM | POA: Diagnosis not present

## 2020-10-11 DIAGNOSIS — D631 Anemia in chronic kidney disease: Secondary | ICD-10-CM | POA: Diagnosis not present

## 2020-10-11 DIAGNOSIS — E118 Type 2 diabetes mellitus with unspecified complications: Secondary | ICD-10-CM | POA: Diagnosis not present

## 2020-10-11 DIAGNOSIS — D689 Coagulation defect, unspecified: Secondary | ICD-10-CM | POA: Diagnosis not present

## 2020-10-13 DIAGNOSIS — D509 Iron deficiency anemia, unspecified: Secondary | ICD-10-CM | POA: Diagnosis not present

## 2020-10-13 DIAGNOSIS — E118 Type 2 diabetes mellitus with unspecified complications: Secondary | ICD-10-CM | POA: Diagnosis not present

## 2020-10-13 DIAGNOSIS — D631 Anemia in chronic kidney disease: Secondary | ICD-10-CM | POA: Diagnosis not present

## 2020-10-13 DIAGNOSIS — D689 Coagulation defect, unspecified: Secondary | ICD-10-CM | POA: Diagnosis not present

## 2020-10-13 DIAGNOSIS — N186 End stage renal disease: Secondary | ICD-10-CM | POA: Diagnosis not present

## 2020-10-13 DIAGNOSIS — N2581 Secondary hyperparathyroidism of renal origin: Secondary | ICD-10-CM | POA: Diagnosis not present

## 2020-10-13 DIAGNOSIS — Z992 Dependence on renal dialysis: Secondary | ICD-10-CM | POA: Diagnosis not present

## 2020-10-15 DIAGNOSIS — E118 Type 2 diabetes mellitus with unspecified complications: Secondary | ICD-10-CM | POA: Diagnosis not present

## 2020-10-15 DIAGNOSIS — Z7901 Long term (current) use of anticoagulants: Secondary | ICD-10-CM | POA: Diagnosis not present

## 2020-10-15 DIAGNOSIS — Z992 Dependence on renal dialysis: Secondary | ICD-10-CM | POA: Diagnosis not present

## 2020-10-15 DIAGNOSIS — N186 End stage renal disease: Secondary | ICD-10-CM | POA: Diagnosis not present

## 2020-10-15 DIAGNOSIS — D689 Coagulation defect, unspecified: Secondary | ICD-10-CM | POA: Diagnosis not present

## 2020-10-15 DIAGNOSIS — D509 Iron deficiency anemia, unspecified: Secondary | ICD-10-CM | POA: Diagnosis not present

## 2020-10-15 DIAGNOSIS — I272 Pulmonary hypertension, unspecified: Secondary | ICD-10-CM | POA: Diagnosis not present

## 2020-10-15 DIAGNOSIS — Z5181 Encounter for therapeutic drug level monitoring: Secondary | ICD-10-CM | POA: Diagnosis not present

## 2020-10-15 DIAGNOSIS — N2581 Secondary hyperparathyroidism of renal origin: Secondary | ICD-10-CM | POA: Diagnosis not present

## 2020-10-15 DIAGNOSIS — D631 Anemia in chronic kidney disease: Secondary | ICD-10-CM | POA: Diagnosis not present

## 2020-10-17 DIAGNOSIS — Z992 Dependence on renal dialysis: Secondary | ICD-10-CM | POA: Diagnosis not present

## 2020-10-17 DIAGNOSIS — D509 Iron deficiency anemia, unspecified: Secondary | ICD-10-CM | POA: Diagnosis not present

## 2020-10-17 DIAGNOSIS — N2581 Secondary hyperparathyroidism of renal origin: Secondary | ICD-10-CM | POA: Diagnosis not present

## 2020-10-17 DIAGNOSIS — E118 Type 2 diabetes mellitus with unspecified complications: Secondary | ICD-10-CM | POA: Diagnosis not present

## 2020-10-17 DIAGNOSIS — D689 Coagulation defect, unspecified: Secondary | ICD-10-CM | POA: Diagnosis not present

## 2020-10-17 DIAGNOSIS — N186 End stage renal disease: Secondary | ICD-10-CM | POA: Diagnosis not present

## 2020-10-17 DIAGNOSIS — D631 Anemia in chronic kidney disease: Secondary | ICD-10-CM | POA: Diagnosis not present

## 2020-10-19 DIAGNOSIS — N186 End stage renal disease: Secondary | ICD-10-CM | POA: Diagnosis not present

## 2020-10-19 DIAGNOSIS — N2581 Secondary hyperparathyroidism of renal origin: Secondary | ICD-10-CM | POA: Diagnosis not present

## 2020-10-19 DIAGNOSIS — Z992 Dependence on renal dialysis: Secondary | ICD-10-CM | POA: Diagnosis not present

## 2020-10-19 DIAGNOSIS — D631 Anemia in chronic kidney disease: Secondary | ICD-10-CM | POA: Diagnosis not present

## 2020-10-19 DIAGNOSIS — D509 Iron deficiency anemia, unspecified: Secondary | ICD-10-CM | POA: Diagnosis not present

## 2020-10-19 DIAGNOSIS — D689 Coagulation defect, unspecified: Secondary | ICD-10-CM | POA: Diagnosis not present

## 2020-10-19 DIAGNOSIS — E118 Type 2 diabetes mellitus with unspecified complications: Secondary | ICD-10-CM | POA: Diagnosis not present

## 2020-10-22 DIAGNOSIS — D631 Anemia in chronic kidney disease: Secondary | ICD-10-CM | POA: Diagnosis not present

## 2020-10-22 DIAGNOSIS — N2581 Secondary hyperparathyroidism of renal origin: Secondary | ICD-10-CM | POA: Diagnosis not present

## 2020-10-22 DIAGNOSIS — Z7901 Long term (current) use of anticoagulants: Secondary | ICD-10-CM | POA: Diagnosis not present

## 2020-10-22 DIAGNOSIS — Z8679 Personal history of other diseases of the circulatory system: Secondary | ICD-10-CM | POA: Diagnosis not present

## 2020-10-22 DIAGNOSIS — D689 Coagulation defect, unspecified: Secondary | ICD-10-CM | POA: Diagnosis not present

## 2020-10-22 DIAGNOSIS — E118 Type 2 diabetes mellitus with unspecified complications: Secondary | ICD-10-CM | POA: Diagnosis not present

## 2020-10-22 DIAGNOSIS — D509 Iron deficiency anemia, unspecified: Secondary | ICD-10-CM | POA: Diagnosis not present

## 2020-10-22 DIAGNOSIS — N186 End stage renal disease: Secondary | ICD-10-CM | POA: Diagnosis not present

## 2020-10-22 DIAGNOSIS — Z5181 Encounter for therapeutic drug level monitoring: Secondary | ICD-10-CM | POA: Diagnosis not present

## 2020-10-22 DIAGNOSIS — Z992 Dependence on renal dialysis: Secondary | ICD-10-CM | POA: Diagnosis not present

## 2020-10-25 DIAGNOSIS — D631 Anemia in chronic kidney disease: Secondary | ICD-10-CM | POA: Diagnosis not present

## 2020-10-25 DIAGNOSIS — Z992 Dependence on renal dialysis: Secondary | ICD-10-CM | POA: Diagnosis not present

## 2020-10-25 DIAGNOSIS — E118 Type 2 diabetes mellitus with unspecified complications: Secondary | ICD-10-CM | POA: Diagnosis not present

## 2020-10-25 DIAGNOSIS — N186 End stage renal disease: Secondary | ICD-10-CM | POA: Diagnosis not present

## 2020-10-25 DIAGNOSIS — D689 Coagulation defect, unspecified: Secondary | ICD-10-CM | POA: Diagnosis not present

## 2020-10-25 DIAGNOSIS — N2581 Secondary hyperparathyroidism of renal origin: Secondary | ICD-10-CM | POA: Diagnosis not present

## 2020-10-25 DIAGNOSIS — D509 Iron deficiency anemia, unspecified: Secondary | ICD-10-CM | POA: Diagnosis not present

## 2020-10-26 DIAGNOSIS — N186 End stage renal disease: Secondary | ICD-10-CM | POA: Diagnosis not present

## 2020-10-26 DIAGNOSIS — Z992 Dependence on renal dialysis: Secondary | ICD-10-CM | POA: Diagnosis not present

## 2020-10-26 DIAGNOSIS — E1129 Type 2 diabetes mellitus with other diabetic kidney complication: Secondary | ICD-10-CM | POA: Diagnosis not present

## 2020-10-27 DIAGNOSIS — N2581 Secondary hyperparathyroidism of renal origin: Secondary | ICD-10-CM | POA: Diagnosis not present

## 2020-10-27 DIAGNOSIS — D509 Iron deficiency anemia, unspecified: Secondary | ICD-10-CM | POA: Diagnosis not present

## 2020-10-27 DIAGNOSIS — D689 Coagulation defect, unspecified: Secondary | ICD-10-CM | POA: Diagnosis not present

## 2020-10-27 DIAGNOSIS — Z992 Dependence on renal dialysis: Secondary | ICD-10-CM | POA: Diagnosis not present

## 2020-10-27 DIAGNOSIS — N186 End stage renal disease: Secondary | ICD-10-CM | POA: Diagnosis not present

## 2020-10-27 DIAGNOSIS — E118 Type 2 diabetes mellitus with unspecified complications: Secondary | ICD-10-CM | POA: Diagnosis not present

## 2020-10-27 NOTE — Progress Notes (Signed)
HPI: Follow-up pulmonary hypertension, diastolic heart failure and previous mitral valve repair. Renal dopplers Nov 2013 showed no RAS. Note previous pericardial effusion with minoxidil. Transesophageal echocardiogram July 2016 showed normal LV function, moderate to severe left atrial enlargement, small pericardial effusion, flail segment of anterior mitral valve leaflet with severe mitral regurgitation and mild tricuspid regurgitation. Cardiac catheterization September 2016 showed nonobstructive coronary disease and severely elevated pulmonary pressures. Carotid Dopplers August 2016 showed 1-39% bilateral stenosis. Patient had mitral valve repair in August 2016. Patient's course was complicated by acute hypoxic respiratory failure refractory to conventional ventilatory support. She was transferred to [facility] for ECMO. Course was complicated by need for tracheostomy. She also developed acute on chronic renal failure and now requires dialysis. Nuclear study January 2020 showed ejection fraction 57% and no scar or ischemia. Right heart catheterization at [facility] November 2020 showed RA pressure of 9, RV pressure 55/8, PA pressure 55/25 and pulmonary capillary wedge pressure of 18.  Echocardiogram at [facility] August 2021 showed normal LV function, mild left ventricular hypertrophy, mild to moderate left atrial enlargement, mild right atrial enlargement, prior mitral valve repair with mean gradient 10.5 mmHg and mild to moderate tricuspid regurgitation.  There was severe pulmonary hypertension with RV systolic pressure estimated at 96 mmHg.   Note TEE performed and showed less than 5 mm gradient across mitral valve but full results not available. Chest CT September 2021 showed changes consistent with pulmonary hypertension versus acute hypersensitivity pneumonitis, possible early fibrosis, hilar adenopathy/pulmonary micronodules and follow-up recommended 12 months.  FEV1 September 2021 1.21.  VQ scan September  2021 concerning for pulmonary embolus and left lower lobe apical segment. Followed at [facility] for pulmonary hypertension as she was being considered for renal transplant.  Presently being treated with Coumadin, sildenafil and OPSUMIT.  Recently contacted the office with increased dyspnea.  Since last seen,she now denies dyspnea.  She occasionally has mild orthopnea.  There is no chest pain or syncope.  No pedal edema.  Current Outpatient Medications  Medication Sig Dispense Refill   acetaminophen (TYLENOL) 500 MG tablet Take 1,000 mg by mouth every 6 (six) hours as needed (for pain.).     aspirin EC 81 MG tablet Take 81 mg by mouth daily.     azelastine (ASTELIN) 0.1 % nasal spray Use 1 to 2 sprays each nostril 2 to 3 x / day 90 mL 3   busPIRone (BUSPAR) 10 MG tablet Take 1 tablet 2 x /day for Anxiety 180 tablet 1   carvedilol (COREG) 25 MG tablet Take 1 tablet 2 x /day for BP & Heart 180 tablet 0   cinacalcet (SENSIPAR) 60 MG tablet Take 1 tablet (60 mg total) by mouth daily. 90 tablet 1   hydrALAZINE (APRESOLINE) 25 MG tablet TAKE 1 TABLET BY MOUTH 3  TIMES DAILY FOR BLOOD  PRESSURE AND HEART 270 tablet 3   insulin aspart protamine - aspart (NOVOLOG MIX 70/30 FLEXPEN) (70-30) 100 UNIT/ML FlexPen Inject 0.1 mLs (10 Units total) into the skin 2 (two) times daily. (Patient taking differently: Inject 10 Units into the skin 2 (two) times daily as needed (if blood sugar is above 180).) 15 mL 3   latanoprost (XALATAN) 0.005 % ophthalmic solution Place 1 drop into both eyes daily.     losartan (COZAAR) 50 MG tablet Take 1 tablet Daily for BP 90 tablet 1   Multiple Vitamins-Minerals (ZINC PO) Take by mouth.     ondansetron (ZOFRAN ODT) 4 MG disintegrating  tablet Take 1 tablet (4 mg total) by mouth every 8 (eight) hours as needed for nausea or vomiting. 10 tablet 0   RENVELA 800 MG tablet Take 1,600-4,000 mg by mouth 3 (three) times daily. 4000 mg (5 caps) with meals and 1600 mg (2  caps) with snacks  5   rosuvastatin (CRESTOR) 20 MG tablet Take 1 tablet Daily for Cholesterol 90 tablet 3   sertraline (ZOLOFT) 25 MG tablet Take 1 tablet Daily for Mood 90 tablet 3   sildenafil (REVATIO) 20 MG tablet Take 2 tablets (40 mg) 3 x  /day fr Pulmonary Hypertension 540 tablet 3   warfarin (COUMADIN) 2.5 MG tablet Take 2 tablets (5 mg) daily, or as directed by the [facility] [phone]).     No current facility-administered medications for this visit.     Past Medical History:  Diagnosis Date   Anemia    Chronic diastolic CHF (congestive heart failure) (HCC)    Chronic diastolic heart failure (HCC)    CKD (chronic kidney disease) stage 4, GFR 15-29 ml/min (HCC)    dialysis M/W/F   Constipation    CVA (cerebral infarction) 1997   no residual deficit   Diverticulitis    DM (diabetes mellitus) (HCC)    type 2   Heart murmur    History of cardiovascular stress test    a. Myoview Oct 2012 showed EF 49%, no ischemia, LVE   HLD (hyperlipidemia)    takes Crestor daily   Hypertension    Pericardial effusion    chronic; felt to be poss related to minoxidil >> DC'd   Pulmonary hypertension ([place]) 06/18/2015   Respiratory failure, acute hypoxic, post-operative 07/08/2015   Requiring ECMO support   S/P cardiac catheterization    a. R/L [facility] 06/18/15:  mLAD 30%; severe pulmo HTN with PA sat 43%, CI 1.86, prominent V waves indicative of MR; resting hypoxemia O2 sat 86% on RA   S/P minimally invasive mitral valve repair 07/08/2015   Complex valvuloplasty including artificial Gore-tex neochord placement x10 and 26 mm Sorin Memo 3D Rechord ring annuloplasty via right mini thoracotomy approach   Severe mitral regurgitation    Shortness of breath dyspnea    Stroke ([place]) 1997   no residual effect   Vitamin D deficiency    Wears glasses     Past Surgical History:  Procedure Laterality Date   A/V FISTULAGRAM Right 08/16/2017   Procedure: A/V  Fistulagram;  Surgeon: Conrad [place], MD;  Location: [place] [facility];  Service: Cardiovascular;  Laterality: Right;   AV FISTULA PLACEMENT Left 10/22/2015   Procedure: RADIOCEPHALIC ARTERIOVENOUS (AV) FISTULA CREATION;  Surgeon: Serafina Mitchell, MD;  Location: [place] OR;  Service: Vascular;  Laterality: Left;   AV FISTULA PLACEMENT Right 05/22/2017   Procedure: ARTERIOVENOUS (AV) FISTULA CREATION-RIGHT ARM;  Surgeon: Waynetta Sandy, MD;  Location: [place];  Service: Vascular;  Laterality: Right;   CANNULATION FOR CARDIOPULMONARY BYPASS N/A 07/08/2015   Procedure: CANNULATION FOR ECMO;  Surgeon: Rexene Alberts, MD;  Location: [place];  Service: Open Heart Surgery;  Laterality: N/A;   CARDIAC CATHETERIZATION     CARDIAC CATHETERIZATION N/A 06/18/2015   Procedure: Right/Left Heart Cath and Coronary Angiography;  Surgeon: Jettie Booze, MD;  Location: [place] [facility];  Service: Cardiovascular;  Laterality: N/A;   CESAREAN SECTION     x 2   COLONOSCOPY     EXCHANGE OF A DIALYSIS CATHETER N/A 06/28/2017   Procedure: EXCHANGE OF  A DIALYSIS CATHETER;  Surgeon: Waynetta Sandy, MD;  Location: [place];  Service: Vascular;  Laterality: N/A;   FISTULA SUPERFICIALIZATION Left 12/28/2015   Procedure: SUPERFICIALIZATION LEFT RADIOCEPHALIC FISTULA;  Surgeon: Serafina Mitchell, MD;  Location: [place];  Service: Vascular;  Laterality: Left;   FISTULA SUPERFICIALIZATION Right 08/21/2017   Procedure: FISTULA SUPERFICIALIZATION RIGHT ARM;  Surgeon: Waynetta Sandy, MD;  Location: [place];  Service: Vascular;  Laterality: Right;   INSERTION OF DIALYSIS CATHETER Right 04/28/2017   Procedure: INSERTION OF DIALYSIS CATHETER-RIGHT INTERNAL JUGULAR PLACEMENT;  Surgeon: Elam Dutch, MD;  Location: [place];  Service: Vascular;  Laterality: Right;   LIGATION OF ARTERIOVENOUS  FISTULA Left 04/28/2017   Procedure: LIGATION OF ARTERIOVENOUS  FISTULA;  Surgeon: Elam Dutch, MD;  Location:  [place];  Service: Vascular;  Laterality: Left;   MITRAL VALVE REPAIR Right 07/08/2015   Procedure: MINIMALLY INVASIVE MITRAL VALVE REPAIR with a 26 Sorin Memo 3D Rechord;  Surgeon: Rexene Alberts, MD;  Location: [place];  Service: Open Heart Surgery;  Laterality: Right;   MULTIPLE EXTRACTIONS WITH ALVEOLOPLASTY N/A 06/30/2015   Procedure: MULTIPLE EXTRACTIONS OF TOOTH #'S 4  AND 30 WITH ALVEOLOPLASTY AND GROSS DEBRIDEMENT  OF REMAINING TEETH;  Surgeon: Lenn Cal, DDS;  Location: [place];  Service: Oral Surgery;  Laterality: N/A;   PERIPHERAL VASCULAR CATHETERIZATION Left 03/28/2016   Procedure: A/V Fistulagram;  Surgeon: Serafina Mitchell, MD;  Location: [place] CV [facility];  Service: Cardiovascular;  Laterality: Left;  lower arm   TEE WITHOUT CARDIOVERSION N/A 06/08/2015   Procedure: TRANSESOPHAGEAL ECHOCARDIOGRAM (TEE);  Surgeon: Lelon Perla, MD;  Location: [facility] ENDOSCOPY;  Service: Cardiovascular;  Laterality: N/A;   TEE WITHOUT CARDIOVERSION N/A 07/08/2015   Procedure: TRANSESOPHAGEAL ECHOCARDIOGRAM (TEE);  Surgeon: Rexene Alberts, MD;  Location: [place];  Service: Open Heart Surgery;  Laterality: N/A;    Social History   Socioeconomic History   Marital status: Married    Spouse name: Not on file   Number of children: 3   Years of education: Not on file   Highest education level: Not on file  Occupational History    Comment: Unemployed; used to work as a Radio broadcast assistant  Tobacco Use   Smoking status: Never Smoker   Smokeless tobacco: Never Used  Scientific laboratory technician Use: Never used  Substance and Sexual Activity   Alcohol use: No    Alcohol/week: 0.0 standard drinks   Drug use: No   Sexual activity: Not on file  Other Topics Concern   Not on file  Social History Narrative   Not on file   Social Determinants of Health   Financial Resource Strain: Not on file  Food Insecurity: Not on file  Transportation Needs: Not on file  Physical Activity: Not on  file  Stress: Not on file  Social Connections: Not on file  Intimate Partner Violence: Not on file    Family History  Problem Relation Age of Onset   Cancer Sister        breast cancer   Stroke Mother    Heart attack Brother        MI in his 92s   Heart disease Brother        before age 24   Breast cancer Sister     ROS: no fevers or chills, productive cough, hemoptysis, dysphasia, odynophagia, melena, hematochezia, dysuria, hematuria, rash, seizure activity, orthopnea, PND, pedal edema, claudication. Remaining systems are  negative.  Physical Exam: Well-developed well-nourished in no acute distress.  Skin is warm and dry.  HEENT is normal.  Neck is supple.  Chest is clear to auscultation with normal expansion.  Cardiovascular exam is regular rate and rhythm.  Abdominal exam nontender or distended. No masses palpated. Extremities show no edema. neuro grossly intact  ECG-sinus rhythm at a rate of 77, first-degree AV block, left anterior fascicular block, nonspecific ST changes.  Personally reviewed  A/P  1 dyspnea/pulmonary hypertension-this is felt secondary to mitral valve disease and group 5 from end-stage renal disease/hemodialysis.  Also with previous pulmonary embolus. Continue present medications.  Followed at [facility].  2 prior mitral valve repair-continue SBE prophylaxis.  Last echocardiogram showed stable repair based on report from [facility].  3 chronic diastolic congestive heart failure-volume managed by nephrology/dialysis.  4 hypertension-blood pressure is elevated; increase hydralazine to 50 mg p.o. 3 times daily and follow.  Note she may need to decrease this on dialysis days if her blood pressure becomes too low postdialysis.  We discussed this today.  5 coronary artery disease-she denies chest pain.  Continue aspirin and statin.  6 hyperlipidemia-continue statin.  7 history of cardiomyopathy-LV function has improved on most recent echocardiogram.   Given dyspnea we are repeating study.  8 end-stage renal disease-Per nephrology.  [name], MD

## 2020-10-29 DIAGNOSIS — N186 End stage renal disease: Secondary | ICD-10-CM | POA: Diagnosis not present

## 2020-10-29 DIAGNOSIS — D509 Iron deficiency anemia, unspecified: Secondary | ICD-10-CM | POA: Diagnosis not present

## 2020-10-29 DIAGNOSIS — E118 Type 2 diabetes mellitus with unspecified complications: Secondary | ICD-10-CM | POA: Diagnosis not present

## 2020-10-29 DIAGNOSIS — N2581 Secondary hyperparathyroidism of renal origin: Secondary | ICD-10-CM | POA: Diagnosis not present

## 2020-10-29 DIAGNOSIS — D689 Coagulation defect, unspecified: Secondary | ICD-10-CM | POA: Diagnosis not present

## 2020-10-29 DIAGNOSIS — Z992 Dependence on renal dialysis: Secondary | ICD-10-CM | POA: Diagnosis not present

## 2020-11-01 DIAGNOSIS — Z992 Dependence on renal dialysis: Secondary | ICD-10-CM | POA: Diagnosis not present

## 2020-11-01 DIAGNOSIS — D689 Coagulation defect, unspecified: Secondary | ICD-10-CM | POA: Diagnosis not present

## 2020-11-01 DIAGNOSIS — N2581 Secondary hyperparathyroidism of renal origin: Secondary | ICD-10-CM | POA: Diagnosis not present

## 2020-11-01 DIAGNOSIS — N186 End stage renal disease: Secondary | ICD-10-CM | POA: Diagnosis not present

## 2020-11-01 DIAGNOSIS — D509 Iron deficiency anemia, unspecified: Secondary | ICD-10-CM | POA: Diagnosis not present

## 2020-11-01 DIAGNOSIS — E118 Type 2 diabetes mellitus with unspecified complications: Secondary | ICD-10-CM | POA: Diagnosis not present

## 2020-11-03 DIAGNOSIS — N2581 Secondary hyperparathyroidism of renal origin: Secondary | ICD-10-CM | POA: Diagnosis not present

## 2020-11-03 DIAGNOSIS — N186 End stage renal disease: Secondary | ICD-10-CM | POA: Diagnosis not present

## 2020-11-03 DIAGNOSIS — E118 Type 2 diabetes mellitus with unspecified complications: Secondary | ICD-10-CM | POA: Diagnosis not present

## 2020-11-03 DIAGNOSIS — D509 Iron deficiency anemia, unspecified: Secondary | ICD-10-CM | POA: Diagnosis not present

## 2020-11-03 DIAGNOSIS — Z992 Dependence on renal dialysis: Secondary | ICD-10-CM | POA: Diagnosis not present

## 2020-11-03 DIAGNOSIS — D689 Coagulation defect, unspecified: Secondary | ICD-10-CM | POA: Diagnosis not present

## 2020-11-05 DIAGNOSIS — E118 Type 2 diabetes mellitus with unspecified complications: Secondary | ICD-10-CM | POA: Diagnosis not present

## 2020-11-05 DIAGNOSIS — Z992 Dependence on renal dialysis: Secondary | ICD-10-CM | POA: Diagnosis not present

## 2020-11-05 DIAGNOSIS — D689 Coagulation defect, unspecified: Secondary | ICD-10-CM | POA: Diagnosis not present

## 2020-11-05 DIAGNOSIS — N186 End stage renal disease: Secondary | ICD-10-CM | POA: Diagnosis not present

## 2020-11-05 DIAGNOSIS — Z7901 Long term (current) use of anticoagulants: Secondary | ICD-10-CM | POA: Diagnosis not present

## 2020-11-05 DIAGNOSIS — N2581 Secondary hyperparathyroidism of renal origin: Secondary | ICD-10-CM | POA: Diagnosis not present

## 2020-11-05 DIAGNOSIS — Z5181 Encounter for therapeutic drug level monitoring: Secondary | ICD-10-CM | POA: Diagnosis not present

## 2020-11-05 DIAGNOSIS — I2724 Chronic thromboembolic pulmonary hypertension: Secondary | ICD-10-CM | POA: Diagnosis not present

## 2020-11-05 DIAGNOSIS — D509 Iron deficiency anemia, unspecified: Secondary | ICD-10-CM | POA: Diagnosis not present

## 2020-11-08 DIAGNOSIS — N2581 Secondary hyperparathyroidism of renal origin: Secondary | ICD-10-CM | POA: Diagnosis not present

## 2020-11-08 DIAGNOSIS — E118 Type 2 diabetes mellitus with unspecified complications: Secondary | ICD-10-CM | POA: Diagnosis not present

## 2020-11-08 DIAGNOSIS — D689 Coagulation defect, unspecified: Secondary | ICD-10-CM | POA: Diagnosis not present

## 2020-11-08 DIAGNOSIS — N186 End stage renal disease: Secondary | ICD-10-CM | POA: Diagnosis not present

## 2020-11-08 DIAGNOSIS — D509 Iron deficiency anemia, unspecified: Secondary | ICD-10-CM | POA: Diagnosis not present

## 2020-11-08 DIAGNOSIS — Z992 Dependence on renal dialysis: Secondary | ICD-10-CM | POA: Diagnosis not present

## 2020-11-09 ENCOUNTER — Other Ambulatory Visit: Payer: Self-pay

## 2020-11-09 ENCOUNTER — Ambulatory Visit (INDEPENDENT_AMBULATORY_CARE_PROVIDER_SITE_OTHER): Payer: Medicare Other | Admitting: Cardiology

## 2020-11-09 ENCOUNTER — Encounter: Payer: Self-pay | Admitting: Cardiology

## 2020-11-09 VITALS — BP 171/85 | HR 77 | Ht 59.5 in | Wt 170.0 lb

## 2020-11-09 DIAGNOSIS — I10 Essential (primary) hypertension: Secondary | ICD-10-CM

## 2020-11-09 DIAGNOSIS — I5032 Chronic diastolic (congestive) heart failure: Secondary | ICD-10-CM

## 2020-11-09 DIAGNOSIS — Z9889 Other specified postprocedural states: Secondary | ICD-10-CM

## 2020-11-09 DIAGNOSIS — E7800 Pure hypercholesterolemia, unspecified: Secondary | ICD-10-CM | POA: Diagnosis not present

## 2020-11-09 DIAGNOSIS — I272 Pulmonary hypertension, unspecified: Secondary | ICD-10-CM | POA: Diagnosis not present

## 2020-11-09 MED ORDER — HYDRALAZINE HCL 50 MG PO TABS: 50.0000 mg | ORAL_TABLET | Freq: Three times a day (TID) | ORAL | 3 refills | Status: AC

## 2020-11-09 NOTE — Patient Instructions (Signed)
Medication Instructions:   INCREASE HYDRALAZINE TO 50 MG THREE TIMES DAILY= 2 OF THE 25 MG TABLETS THREE TIMES DAILY  *If you need a refill on your cardiac medications before your next appointment, please call your pharmacy*   Follow-Up: At [facility], you and your health needs are our priority.  As part of our continuing mission to provide you with exceptional heart care, we have created designated Provider Care Teams.  These Care Teams include your primary Cardiologist (physician) and Advanced Practice Providers (APPs -  Physician Assistants and Nurse Practitioners) who all work together to provide you with the care you need, when you need it.  We recommend signing up for the patient portal called "MyChart".  Sign up information is provided on this After Visit Summary.  MyChart is used to connect with patients for Virtual Visits (Telemedicine).  Patients are able to view lab/test results, encounter notes, upcoming appointments, etc.  Non-urgent messages can be sent to your provider as well.   To learn more about what you can do with MyChart, go to [email or web address].    Your next appointment:   12 month(s)  The format for your next appointment:   In Person  Provider:   [name], MD

## 2020-11-10 DIAGNOSIS — Z992 Dependence on renal dialysis: Secondary | ICD-10-CM | POA: Diagnosis not present

## 2020-11-10 DIAGNOSIS — D509 Iron deficiency anemia, unspecified: Secondary | ICD-10-CM | POA: Diagnosis not present

## 2020-11-10 DIAGNOSIS — D689 Coagulation defect, unspecified: Secondary | ICD-10-CM | POA: Diagnosis not present

## 2020-11-10 DIAGNOSIS — N186 End stage renal disease: Secondary | ICD-10-CM | POA: Diagnosis not present

## 2020-11-10 DIAGNOSIS — N2581 Secondary hyperparathyroidism of renal origin: Secondary | ICD-10-CM | POA: Diagnosis not present

## 2020-11-10 DIAGNOSIS — E118 Type 2 diabetes mellitus with unspecified complications: Secondary | ICD-10-CM | POA: Diagnosis not present

## 2020-11-12 DIAGNOSIS — Z7901 Long term (current) use of anticoagulants: Secondary | ICD-10-CM | POA: Diagnosis not present

## 2020-11-12 DIAGNOSIS — N186 End stage renal disease: Secondary | ICD-10-CM | POA: Diagnosis not present

## 2020-11-12 DIAGNOSIS — E118 Type 2 diabetes mellitus with unspecified complications: Secondary | ICD-10-CM | POA: Diagnosis not present

## 2020-11-12 DIAGNOSIS — N2581 Secondary hyperparathyroidism of renal origin: Secondary | ICD-10-CM | POA: Diagnosis not present

## 2020-11-12 DIAGNOSIS — Z992 Dependence on renal dialysis: Secondary | ICD-10-CM | POA: Diagnosis not present

## 2020-11-12 DIAGNOSIS — D509 Iron deficiency anemia, unspecified: Secondary | ICD-10-CM | POA: Diagnosis not present

## 2020-11-12 DIAGNOSIS — I2724 Chronic thromboembolic pulmonary hypertension: Secondary | ICD-10-CM | POA: Diagnosis not present

## 2020-11-12 DIAGNOSIS — Z5181 Encounter for therapeutic drug level monitoring: Secondary | ICD-10-CM | POA: Diagnosis not present

## 2020-11-12 DIAGNOSIS — D689 Coagulation defect, unspecified: Secondary | ICD-10-CM | POA: Diagnosis not present

## 2020-11-15 DIAGNOSIS — D689 Coagulation defect, unspecified: Secondary | ICD-10-CM | POA: Diagnosis not present

## 2020-11-15 DIAGNOSIS — D509 Iron deficiency anemia, unspecified: Secondary | ICD-10-CM | POA: Diagnosis not present

## 2020-11-15 DIAGNOSIS — N2581 Secondary hyperparathyroidism of renal origin: Secondary | ICD-10-CM | POA: Diagnosis not present

## 2020-11-15 DIAGNOSIS — E118 Type 2 diabetes mellitus with unspecified complications: Secondary | ICD-10-CM | POA: Diagnosis not present

## 2020-11-15 DIAGNOSIS — N186 End stage renal disease: Secondary | ICD-10-CM | POA: Diagnosis not present

## 2020-11-15 DIAGNOSIS — Z992 Dependence on renal dialysis: Secondary | ICD-10-CM | POA: Diagnosis not present

## 2020-11-17 DIAGNOSIS — Z992 Dependence on renal dialysis: Secondary | ICD-10-CM | POA: Diagnosis not present

## 2020-11-17 DIAGNOSIS — E118 Type 2 diabetes mellitus with unspecified complications: Secondary | ICD-10-CM | POA: Diagnosis not present

## 2020-11-17 DIAGNOSIS — D509 Iron deficiency anemia, unspecified: Secondary | ICD-10-CM | POA: Diagnosis not present

## 2020-11-17 DIAGNOSIS — N186 End stage renal disease: Secondary | ICD-10-CM | POA: Diagnosis not present

## 2020-11-17 DIAGNOSIS — D689 Coagulation defect, unspecified: Secondary | ICD-10-CM | POA: Diagnosis not present

## 2020-11-17 DIAGNOSIS — N2581 Secondary hyperparathyroidism of renal origin: Secondary | ICD-10-CM | POA: Diagnosis not present

## 2020-11-18 DIAGNOSIS — Z992 Dependence on renal dialysis: Secondary | ICD-10-CM | POA: Diagnosis not present

## 2020-11-18 DIAGNOSIS — I871 Compression of vein: Secondary | ICD-10-CM | POA: Diagnosis not present

## 2020-11-18 DIAGNOSIS — N186 End stage renal disease: Secondary | ICD-10-CM | POA: Diagnosis not present

## 2020-11-19 DIAGNOSIS — D509 Iron deficiency anemia, unspecified: Secondary | ICD-10-CM | POA: Diagnosis not present

## 2020-11-19 DIAGNOSIS — N186 End stage renal disease: Secondary | ICD-10-CM | POA: Diagnosis not present

## 2020-11-19 DIAGNOSIS — D689 Coagulation defect, unspecified: Secondary | ICD-10-CM | POA: Diagnosis not present

## 2020-11-19 DIAGNOSIS — N2581 Secondary hyperparathyroidism of renal origin: Secondary | ICD-10-CM | POA: Diagnosis not present

## 2020-11-19 DIAGNOSIS — E118 Type 2 diabetes mellitus with unspecified complications: Secondary | ICD-10-CM | POA: Diagnosis not present

## 2020-11-19 DIAGNOSIS — Z992 Dependence on renal dialysis: Secondary | ICD-10-CM | POA: Diagnosis not present

## 2020-11-22 DIAGNOSIS — D509 Iron deficiency anemia, unspecified: Secondary | ICD-10-CM | POA: Diagnosis not present

## 2020-11-22 DIAGNOSIS — E118 Type 2 diabetes mellitus with unspecified complications: Secondary | ICD-10-CM | POA: Diagnosis not present

## 2020-11-22 DIAGNOSIS — R791 Abnormal coagulation profile: Secondary | ICD-10-CM | POA: Diagnosis not present

## 2020-11-22 DIAGNOSIS — Z7901 Long term (current) use of anticoagulants: Secondary | ICD-10-CM | POA: Diagnosis not present

## 2020-11-22 DIAGNOSIS — Z5181 Encounter for therapeutic drug level monitoring: Secondary | ICD-10-CM | POA: Diagnosis not present

## 2020-11-22 DIAGNOSIS — N186 End stage renal disease: Secondary | ICD-10-CM | POA: Diagnosis not present

## 2020-11-22 DIAGNOSIS — Z992 Dependence on renal dialysis: Secondary | ICD-10-CM | POA: Diagnosis not present

## 2020-11-22 DIAGNOSIS — N2581 Secondary hyperparathyroidism of renal origin: Secondary | ICD-10-CM | POA: Diagnosis not present

## 2020-11-22 DIAGNOSIS — D689 Coagulation defect, unspecified: Secondary | ICD-10-CM | POA: Diagnosis not present

## 2020-11-22 DIAGNOSIS — I2724 Chronic thromboembolic pulmonary hypertension: Secondary | ICD-10-CM | POA: Diagnosis not present

## 2020-11-23 ENCOUNTER — Encounter: Payer: Medicare Other | Admitting: Internal Medicine

## 2020-11-23 DIAGNOSIS — H40013 Open angle with borderline findings, low risk, bilateral: Secondary | ICD-10-CM | POA: Diagnosis not present

## 2020-11-23 DIAGNOSIS — I272 Pulmonary hypertension, unspecified: Secondary | ICD-10-CM | POA: Diagnosis not present

## 2020-11-23 DIAGNOSIS — E119 Type 2 diabetes mellitus without complications: Secondary | ICD-10-CM | POA: Diagnosis not present

## 2020-11-23 DIAGNOSIS — H16223 Keratoconjunctivitis sicca, not specified as Sjogren's, bilateral: Secondary | ICD-10-CM | POA: Diagnosis not present

## 2020-11-23 DIAGNOSIS — H2513 Age-related nuclear cataract, bilateral: Secondary | ICD-10-CM | POA: Diagnosis not present

## 2020-11-23 DIAGNOSIS — N185 Chronic kidney disease, stage 5: Secondary | ICD-10-CM | POA: Diagnosis not present

## 2020-11-23 DIAGNOSIS — I120 Hypertensive chronic kidney disease with stage 5 chronic kidney disease or end stage renal disease: Secondary | ICD-10-CM | POA: Diagnosis not present

## 2020-11-23 DIAGNOSIS — Z992 Dependence on renal dialysis: Secondary | ICD-10-CM | POA: Diagnosis not present

## 2020-11-23 DIAGNOSIS — Z8673 Personal history of transient ischemic attack (TIA), and cerebral infarction without residual deficits: Secondary | ICD-10-CM | POA: Diagnosis not present

## 2020-11-24 DIAGNOSIS — N186 End stage renal disease: Secondary | ICD-10-CM | POA: Diagnosis not present

## 2020-11-24 DIAGNOSIS — E118 Type 2 diabetes mellitus with unspecified complications: Secondary | ICD-10-CM | POA: Diagnosis not present

## 2020-11-24 DIAGNOSIS — Z992 Dependence on renal dialysis: Secondary | ICD-10-CM | POA: Diagnosis not present

## 2020-11-24 DIAGNOSIS — D689 Coagulation defect, unspecified: Secondary | ICD-10-CM | POA: Diagnosis not present

## 2020-11-24 DIAGNOSIS — D509 Iron deficiency anemia, unspecified: Secondary | ICD-10-CM | POA: Diagnosis not present

## 2020-11-24 DIAGNOSIS — N2581 Secondary hyperparathyroidism of renal origin: Secondary | ICD-10-CM | POA: Diagnosis not present

## 2020-11-26 DIAGNOSIS — D689 Coagulation defect, unspecified: Secondary | ICD-10-CM | POA: Diagnosis not present

## 2020-11-26 DIAGNOSIS — E118 Type 2 diabetes mellitus with unspecified complications: Secondary | ICD-10-CM | POA: Diagnosis not present

## 2020-11-26 DIAGNOSIS — Z992 Dependence on renal dialysis: Secondary | ICD-10-CM | POA: Diagnosis not present

## 2020-11-26 DIAGNOSIS — N2581 Secondary hyperparathyroidism of renal origin: Secondary | ICD-10-CM | POA: Diagnosis not present

## 2020-11-26 DIAGNOSIS — D509 Iron deficiency anemia, unspecified: Secondary | ICD-10-CM | POA: Diagnosis not present

## 2020-11-26 DIAGNOSIS — E1129 Type 2 diabetes mellitus with other diabetic kidney complication: Secondary | ICD-10-CM | POA: Diagnosis not present

## 2020-11-26 DIAGNOSIS — N186 End stage renal disease: Secondary | ICD-10-CM | POA: Diagnosis not present

## 2020-11-29 DIAGNOSIS — I159 Secondary hypertension, unspecified: Secondary | ICD-10-CM | POA: Diagnosis not present

## 2020-11-29 DIAGNOSIS — D509 Iron deficiency anemia, unspecified: Secondary | ICD-10-CM | POA: Diagnosis not present

## 2020-11-29 DIAGNOSIS — Z7901 Long term (current) use of anticoagulants: Secondary | ICD-10-CM | POA: Diagnosis not present

## 2020-11-29 DIAGNOSIS — D689 Coagulation defect, unspecified: Secondary | ICD-10-CM | POA: Diagnosis not present

## 2020-11-29 DIAGNOSIS — N2581 Secondary hyperparathyroidism of renal origin: Secondary | ICD-10-CM | POA: Diagnosis not present

## 2020-11-29 DIAGNOSIS — D631 Anemia in chronic kidney disease: Secondary | ICD-10-CM | POA: Diagnosis not present

## 2020-11-29 DIAGNOSIS — E118 Type 2 diabetes mellitus with unspecified complications: Secondary | ICD-10-CM | POA: Diagnosis not present

## 2020-11-29 DIAGNOSIS — N186 End stage renal disease: Secondary | ICD-10-CM | POA: Diagnosis not present

## 2020-11-29 DIAGNOSIS — Z992 Dependence on renal dialysis: Secondary | ICD-10-CM | POA: Diagnosis not present

## 2020-11-29 DIAGNOSIS — I2699 Other pulmonary embolism without acute cor pulmonale: Secondary | ICD-10-CM | POA: Diagnosis not present

## 2020-11-29 DIAGNOSIS — Z5181 Encounter for therapeutic drug level monitoring: Secondary | ICD-10-CM | POA: Diagnosis not present

## 2020-11-29 DIAGNOSIS — Z8673 Personal history of transient ischemic attack (TIA), and cerebral infarction without residual deficits: Secondary | ICD-10-CM | POA: Diagnosis not present

## 2020-11-29 DIAGNOSIS — I2724 Chronic thromboembolic pulmonary hypertension: Secondary | ICD-10-CM | POA: Diagnosis not present

## 2020-12-01 DIAGNOSIS — N2581 Secondary hyperparathyroidism of renal origin: Secondary | ICD-10-CM | POA: Diagnosis not present

## 2020-12-01 DIAGNOSIS — Z992 Dependence on renal dialysis: Secondary | ICD-10-CM | POA: Diagnosis not present

## 2020-12-01 DIAGNOSIS — D689 Coagulation defect, unspecified: Secondary | ICD-10-CM | POA: Diagnosis not present

## 2020-12-01 DIAGNOSIS — N186 End stage renal disease: Secondary | ICD-10-CM | POA: Diagnosis not present

## 2020-12-01 DIAGNOSIS — I159 Secondary hypertension, unspecified: Secondary | ICD-10-CM | POA: Diagnosis not present

## 2020-12-01 DIAGNOSIS — D631 Anemia in chronic kidney disease: Secondary | ICD-10-CM | POA: Diagnosis not present

## 2020-12-01 DIAGNOSIS — E118 Type 2 diabetes mellitus with unspecified complications: Secondary | ICD-10-CM | POA: Diagnosis not present

## 2020-12-01 DIAGNOSIS — D509 Iron deficiency anemia, unspecified: Secondary | ICD-10-CM | POA: Diagnosis not present

## 2020-12-03 DIAGNOSIS — I2724 Chronic thromboembolic pulmonary hypertension: Secondary | ICD-10-CM | POA: Diagnosis not present

## 2020-12-03 DIAGNOSIS — I159 Secondary hypertension, unspecified: Secondary | ICD-10-CM | POA: Diagnosis not present

## 2020-12-03 DIAGNOSIS — D631 Anemia in chronic kidney disease: Secondary | ICD-10-CM | POA: Diagnosis not present

## 2020-12-03 DIAGNOSIS — Z992 Dependence on renal dialysis: Secondary | ICD-10-CM | POA: Diagnosis not present

## 2020-12-03 DIAGNOSIS — D509 Iron deficiency anemia, unspecified: Secondary | ICD-10-CM | POA: Diagnosis not present

## 2020-12-03 DIAGNOSIS — N186 End stage renal disease: Secondary | ICD-10-CM | POA: Diagnosis not present

## 2020-12-03 DIAGNOSIS — D689 Coagulation defect, unspecified: Secondary | ICD-10-CM | POA: Diagnosis not present

## 2020-12-03 DIAGNOSIS — Z5181 Encounter for therapeutic drug level monitoring: Secondary | ICD-10-CM | POA: Diagnosis not present

## 2020-12-03 DIAGNOSIS — Z7901 Long term (current) use of anticoagulants: Secondary | ICD-10-CM | POA: Diagnosis not present

## 2020-12-03 DIAGNOSIS — E118 Type 2 diabetes mellitus with unspecified complications: Secondary | ICD-10-CM | POA: Diagnosis not present

## 2020-12-03 DIAGNOSIS — N2581 Secondary hyperparathyroidism of renal origin: Secondary | ICD-10-CM | POA: Diagnosis not present

## 2020-12-06 DIAGNOSIS — N2581 Secondary hyperparathyroidism of renal origin: Secondary | ICD-10-CM | POA: Diagnosis not present

## 2020-12-06 DIAGNOSIS — E118 Type 2 diabetes mellitus with unspecified complications: Secondary | ICD-10-CM | POA: Diagnosis not present

## 2020-12-06 DIAGNOSIS — D631 Anemia in chronic kidney disease: Secondary | ICD-10-CM | POA: Diagnosis not present

## 2020-12-06 DIAGNOSIS — D509 Iron deficiency anemia, unspecified: Secondary | ICD-10-CM | POA: Diagnosis not present

## 2020-12-06 DIAGNOSIS — I159 Secondary hypertension, unspecified: Secondary | ICD-10-CM | POA: Diagnosis not present

## 2020-12-06 DIAGNOSIS — Z992 Dependence on renal dialysis: Secondary | ICD-10-CM | POA: Diagnosis not present

## 2020-12-06 DIAGNOSIS — N186 End stage renal disease: Secondary | ICD-10-CM | POA: Diagnosis not present

## 2020-12-06 DIAGNOSIS — D689 Coagulation defect, unspecified: Secondary | ICD-10-CM | POA: Diagnosis not present

## 2020-12-06 NOTE — Progress Notes (Signed)
COMPLETE PHYSICAL  Assessment:   Jody Nguyen was seen today for medicare wellness and follow-up.  Diagnoses and all orders for this visit:  Encounter for routine medical examination with abnormal medical findings. Yearly  Essential hypertension At goal; no problems at dialysis Continue: Monitor blood pressure at home; call if consistently over 130/80 Continue DASH diet.   Reminder to go to the ER if any CP, SOB, nausea, dizziness, severe HA, changes vision/speech, left arm numbness and tingling and jaw pain  Chronic diastolic heart failure (HCC)       Followed by cardiology; edema resolved since dialysis  Insulin dependent diabetes with renal manifestation (HCC) Insulin 70/30 PRN hyperglycemia 180<; has not needed in past 12+ months Reminded eye Exam yearly (has done for 2019, report requested from Dr. Katy Fitch) and Dental Exam every 6 months. Dietary recommendations reviewed  Physical Activity recommendations reviewed -     Hemoglobin A1c  ESRD on dialysis [facility]) Doing well with dialysis after extensive fistula access issues; MWF; followed by Dr. Joelyn Oms [facility] for renal transplant via [facility] -     CMP/GFR  AV fistula ([place]) Followed by nephrology, vascular PRN  Anemia in chronic kidney disease, on chronic dialysis ([place]) Followed by nephrology -     CBC with Differential/Platelet  Hyperlipidemia associated with T2DM ([place]) Continue medications: crestor 20 mg daily LDL goal <70 Continue low cholesterol diet and exercise.  -     Lipid panel -     TSH  Vitamin D deficiency Followed by nephrology; continue supplementation -     VITAMIN D 25 Hydroxy (Vit-D Deficiency, Fractures)  Morbid obesity (BMI 36)  Commended on weight loss progress Long discussion about weight loss, diet, and exercise Recommended diet heavy in fruits and veggies and low in animal meats, cheeses, and dairy products, appropriate calorie intake Discussed appropriate weight for height, initial goal weight  of 175lb discussed Follow up 3 months  Pulmonary hypertension ([place]) Monitoring, symptoms improved from previous, stable Pulmonology referral as indicated  Cardiomyopathy, unspecified type ([place]) Followed by cardiology, denies concerning sx, fluids managed by dialysis  Paroxysmal atrial fibrillation ([place]) No longer on anticoagulation; continue follow up with cardiology  Chronic periodontitis Reminded to schedule follow up with dental  S/P minimally invasive mitral valve repair Followed by cardiology  Depression in remission ([place]) In remission on medicatoins; Continue zoloft, buspar  Lifestyle discussed: diet/exerise, sleep hygiene, stress management, hydration  Anemia in chronic kidney disease, on dialysis ([place]) Monitor CBC, managed by nephrology   Secondary renal hyperparathyroidism ([place]) Monitor; managed by nephrology    Medication management Continued    Over 40 minutes of face to face interview, exam, counseling, chart review and critical decision making was performed Future Appointments  Date Time Provider [place]  12/08/2021 11:00 AM Unk Pinto, MD [facility] None      Subjective:  Jody Nguyen is a 65 y.o. female who presents for Medicare Annual Wellness Visit and 3 month follow up.   She  Is down to 169lbs from 184lbs.  She has cut out sodas and changed the way that she eats.  She has been walking two days a week, not on dialysis days.   Last OV was 12/03/20 for anticoagulation therapy tele visit.  This is being monitored every Wednesday.  She is taking Coumadin 7.5mg  Mon/Wed/Fri and 5mg  all other days.    In 2016, she underwent an elective AoVRw/Anesthesia complications with consequent multi systems failure then transferred to [facility] for ECMO  & Dialysis. She has been  on Dialysis treatments 3 x/weekM/W/F, followed by Dr. Joelyn Oms. She is hopeful for a Kidney transplant. She has follow up next month with [facility], trying to lose a bit more weight  before then.   She reports she is doing very well recently. She is followed by cardiology with extensive hx including cath, bypass and minimally invasive mitral valve repair in 2016.  Last OV was 10/2020, yearly follow up.  Echocardiogram February 2020 at [facility] showed normal LV function, prior mitral valve repair with mild mitral regurgitation and mean gradient 5 mmHg, mild to moderate left atrial enlargement, mild right atrial enlargement, moderate tricuspid regurgitation.  Right heart catheterization at [facility] November 2020 showed RA pressure of 9, RV pressure 55/8, PA pressure 55/25 and pulmonary capillary wedge pressure of 18.  Followed at [facility] for pulmonary hypertension as she was being considered for renal transplant.  Reports she is doing well in this past year, denies CP, dyspnea, palpitations, orthopnea, LE edema.   she has a diagnosis of depression in remission on medication and currently remains on zoloft 25 mg, buspar 10 mg BID, reports symptoms are well controlled on current regimen.   BMI is Body mass index is 34.13 kg/m., she has been working on diet and exercise, walking 30 min 3 days a week on non-dialysis days, has been cutting out meat, focusing on vegetables, beans, nuts.  Wt Readings from Last 3 Encounters:  12/07/20 169 lb (76.7 kg)  11/09/20 170 lb (77.1 kg)  05/21/20 179 lb 3.2 oz (81.3 kg)    Her blood pressure has been controlled at home, today their BP is BP: 120/80 She does workout. She denies chest pain, shortness of breath, dizziness.   She is on cholesterol medication (rosuvastatin 20 mg daily) and denies myalgias. Her cholesterol is at goal. The cholesterol last visit was:   Lab Results  Component Value Date   CHOL 247 (H) 05/06/2020   HDL 46 (L) 05/06/2020   LDLCALC 172 (H) 05/06/2020   TRIG 144 05/06/2020   CHOLHDL 5.4 (H) 05/06/2020    She has been working on diet and exercise for T2 diabetes, insulin PRN, on daily ASA, statin, not on  ACE/ARB secondary to ESRD on dialysis, and denies foot ulcerations, hyperglycemia, hypoglycemia , increased appetite, nausea, paresthesia of the feet, polydipsia, polyuria, visual disturbances and vomiting. She takes insulin only as needed for glucose readings above 180. Has not needed in past 3 months. She reports fasting glucose typically 115-120. Last A1C in the office was:  Lab Results  Component Value Date   HGBA1C 5.7 (H) 05/06/2020   She has ESRD on dialysis, gets on MWF. Follows with [name]. Last GFR: Lab Results  Component Value Date   GFRAA 7 (L) 05/06/2020   Patient is on Vitamin D supplement, taking what is administered to on dialysis days by nephrology  Lab Results  Component Value Date   VD25OH 36 10/30/2019      Medication Review: Current Outpatient Medications on File Prior to Visit  Medication Sig Dispense Refill   acetaminophen (TYLENOL) 500 MG tablet Take 1,000 mg by mouth every 6 (six) hours as needed (for pain.).     aspirin EC 81 MG tablet Take 81 mg by mouth daily.     azelastine (ASTELIN) 0.1 % nasal spray Use 1 to 2 sprays each nostril 2 to 3 x / day 90 mL 3   busPIRone (BUSPAR) 10 MG tablet Take 1 tablet 2 x /day for Anxiety 180 tablet 1  carvedilol (COREG) 25 MG tablet Take 1 tablet 2 x /day for BP & Heart 180 tablet 0   cinacalcet (SENSIPAR) 60 MG tablet Take 1 tablet (60 mg total) by mouth daily. 90 tablet 1   hydrALAZINE (APRESOLINE) 50 MG tablet Take 1 tablet (50 mg total) by mouth 3 (three) times daily. FOR BLOOD  PRESSURE AND HEART 270 tablet 3   latanoprost (XALATAN) 0.005 % ophthalmic solution Place 1 drop into both eyes daily.     losartan (COZAAR) 50 MG tablet Take 1 tablet Daily for BP 90 tablet 1   Multiple Vitamins-Minerals (ZINC PO) Take by mouth.     ondansetron (ZOFRAN ODT) 4 MG disintegrating tablet Take 1 tablet (4 mg total) by mouth every 8 (eight) hours as needed for nausea or vomiting. 10 tablet 0   RENVELA 800 MG tablet  Take 1,600-4,000 mg by mouth 3 (three) times daily. 4000 mg (5 caps) with meals and 1600 mg (2 caps) with snacks  5   rosuvastatin (CRESTOR) 20 MG tablet Take 1 tablet Daily for Cholesterol 90 tablet 3   sertraline (ZOLOFT) 25 MG tablet Take 1 tablet Daily for Mood 90 tablet 3   sildenafil (REVATIO) 20 MG tablet Take 2 tablets (40 mg) 3 x  /day fr Pulmonary Hypertension 540 tablet 3   warfarin (COUMADIN) 2.5 MG tablet Take 2 tablets (5 mg) daily, or as directed by the [facility] [phone]).     No current facility-administered medications on file prior to visit.    Allergies  Allergen Reactions   Minoxidil Other (See Comments)    Pericardial effusion   Lipitor [Atorvastatin] Other (See Comments)    MYALGIAS > "pain in legs" Tolerates rosuvastatin   Morphine And Related Itching   Ace Inhibitors Other (See Comments)    REACTION: "not sure...think it made me drowsy all the time"    Current Problems (verified) Patient Active Problem List   Diagnosis Date Noted   A-V fistula ([place]) 01/29/2020   Secondary renal hyperparathyroidism ([place]) 07/28/2019   Major depression in remission ([place]) 01/22/2019   ESRD on dialysis ([place]) 10/09/2015   Cardiomyopathy ([place]) 09/10/2015   Atrial fibrillation ([place]) 09/10/2015   S/P minimally invasive mitral valve repair 07/08/2015   Chronic periodontitis 06/30/2015   Chronic diastolic heart failure ([place])    Pulmonary hypertension ([place]) 06/18/2015   Morbid obesity (BMI 36.05)  03/29/2015   Type 2 diabetes mellitus ([place]) 11/22/2014   Vitamin D deficiency 03/25/2014   Anemia in chronic kidney disease    Essential hypertension 08/17/2011   Hyperlipidemia associated with type 2 diabetes mellitus ([place]) 08/17/2011    Screening Tests Immunization History  Administered Date(s) Administered   Hepatitis B, adult 10/25/2015, 11/24/2015, 12/27/2015   Influenza Split 08/20/2012, 08/15/2017   Influenza, Quadrivalent,  Recombinant, Inj, Pf 08/25/2019   Influenza-Unspecified 09/22/2020   Moderna Sars-Covid-2 Vaccination 02/06/2020, 03/05/2020   PPD Test 03/25/2014, 03/29/2015, 09/03/2015   Pneumococcal Conjugate-13 03/19/2019   Pneumococcal Polysaccharide-23 09/18/2016, 09/11/2017   Pneumococcal-Unspecified 07/28/2008   Td 01/23/2019   Tdap 12/28/2008    Preventative care: Last colonoscopy: 2013  Last mammogram: 08/2020 Last pap smear/pelvic exam: 10/2018 neg HPV  DEXA: 2010 normal - not a candidate for bisphophonates - defer  Prior vaccinations: TD or Tdap: 2020   Influenza: 08/2020  Pneumococcal: 2018 Prevnar13: 2009 Shingles/Zostavax: zostavax, can't remember when, less than 4 years ago SARS-COV2- Moderna Booster 10/2020   Names of Other Physician/Practitioners you currently use: 1. [facility]  here for primary care 2. Dr. Katy Fitch, eye doctor, last visit 12/29/202, report verified and abstracted 3. Dr. Orene Desanctis, dentist, last visit 2016, DUE    Patient Care Team: Unk Pinto, MD as PCP - General (Internal Medicine) Stanford Breed Denice Bors, MD as PCP - Cardiology (Cardiology) Rexene Agent, MD as Attending Physician (Nephrology)  SURGICAL HISTORY She  has a past surgical history that includes Cesarean section; TEE without cardioversion (N/A, 06/08/2015); Cardiac catheterization; Cardiac catheterization (N/A, 06/18/2015); Colonoscopy; Multiple extractions with alveoloplasty (N/A, 06/30/2015); Mitral valve repair (Right, 07/08/2015); TEE without cardioversion (N/A, 07/08/2015); Cannulation for cardiopulmonary bypass (N/A, 07/08/2015); AV fistula placement (Left, 10/22/2015); Fistula superficialization (Left, 12/28/2015); Cardiac catheterization (Left, 03/28/2016); Ligation of arteriovenous  fistula (Left, 04/28/2017); Insertion of dialysis catheter (Right, 04/28/2017); AV fistula placement (Right, 05/22/2017); Exchange of a dialysis catheter (N/A, 06/28/2017); A/V  Fistulagram (Right, 08/16/2017); and Fistula superficialization (Right, 08/21/2017). FAMILY HISTORY Her family history includes Breast cancer in her sister; Cancer in her sister; Heart attack in her brother; Heart disease in her brother; Stroke in her mother. SOCIAL HISTORY She  reports that she has never smoked. She has never used smokeless tobacco. She reports that she does not drink alcohol and does not use drugs.   Review of Systems  Constitutional: Negative for malaise/fatigue and weight loss.  HENT: Negative for hearing loss and tinnitus.   Eyes: Negative for blurred vision and double vision.  Respiratory: Negative for cough, shortness of breath and wheezing.   Cardiovascular: Negative for chest pain, palpitations, orthopnea, claudication and leg swelling.  Gastrointestinal: Negative for abdominal pain, blood in stool, constipation, diarrhea, heartburn, melena, nausea and vomiting.  Genitourinary: Negative.   Musculoskeletal: Negative for joint pain and myalgias.  Skin: Negative for rash.  Neurological: Negative for dizziness, tingling, sensory change, weakness and headaches.  Endo/Heme/Allergies: Negative for polydipsia.  Psychiatric/Behavioral: Negative.   All other systems reviewed and are negative.    Objective:     Today's Vitals   12/07/20 1003  BP: 120/80  Pulse: 70  Temp: 97.6 F (36.4 C)  SpO2: 96%  Weight: 169 lb (76.7 kg)  Height: 4\' 11"  (1.499 m)   Body mass index is 34.13 kg/m.  General appearance: alert, no distress, WD/WN, female HEENT: normocephalic, sclerae anicteric, TMs pearly, nares patent, no discharge or erythema, pharynx normal Oral cavity: MMM, no lesions Neck: supple, no lymphadenopathy, no thyromegaly, no masses Heart: RRR, normal S1, S2, no murmurs. AV fistula to RUE with scarring.  Lungs: CTA bilaterally, no wheezes, rhonchi, or rales Abdomen: +bs, soft, non tender, non distended, no masses, no hepatomegaly, no  splenomegaly Musculoskeletal: nontender, no swelling, no obvious deformity Extremities: no edema, no cyanosis, no clubbing Pulses: 2+ symmetric, upper and lower extremities, normal cap refill. R AV fistula Neurological: alert, oriented x 3, CN2-12 intact, strength normal upper extremities and lower extremities, sensation normal throughout, DTRs 2+ throughout, no cerebellar signs, gait normal Psychiatric: normal affect, behavior normal, pleasant      Garnet Sierras, Laqueta Jean, DNP [facility] 12/07/2020  10:48 AM

## 2020-12-07 ENCOUNTER — Other Ambulatory Visit: Payer: Self-pay

## 2020-12-07 ENCOUNTER — Encounter: Payer: Self-pay | Admitting: Adult Health Nurse Practitioner

## 2020-12-07 ENCOUNTER — Ambulatory Visit (INDEPENDENT_AMBULATORY_CARE_PROVIDER_SITE_OTHER): Payer: Medicare Other | Admitting: Adult Health Nurse Practitioner

## 2020-12-07 VITALS — BP 120/80 | HR 70 | Temp 97.6°F | Ht 59.0 in | Wt 169.0 lb

## 2020-12-07 DIAGNOSIS — I770 Arteriovenous fistula, acquired: Secondary | ICD-10-CM | POA: Diagnosis not present

## 2020-12-07 DIAGNOSIS — I272 Pulmonary hypertension, unspecified: Secondary | ICD-10-CM

## 2020-12-07 DIAGNOSIS — I429 Cardiomyopathy, unspecified: Secondary | ICD-10-CM | POA: Diagnosis not present

## 2020-12-07 DIAGNOSIS — I10 Essential (primary) hypertension: Secondary | ICD-10-CM | POA: Diagnosis not present

## 2020-12-07 DIAGNOSIS — E1169 Type 2 diabetes mellitus with other specified complication: Secondary | ICD-10-CM

## 2020-12-07 DIAGNOSIS — H9201 Otalgia, right ear: Secondary | ICD-10-CM

## 2020-12-07 DIAGNOSIS — E1122 Type 2 diabetes mellitus with diabetic chronic kidney disease: Secondary | ICD-10-CM | POA: Diagnosis not present

## 2020-12-07 DIAGNOSIS — E559 Vitamin D deficiency, unspecified: Secondary | ICD-10-CM | POA: Diagnosis not present

## 2020-12-07 DIAGNOSIS — H6121 Impacted cerumen, right ear: Secondary | ICD-10-CM

## 2020-12-07 DIAGNOSIS — Z9889 Other specified postprocedural states: Secondary | ICD-10-CM

## 2020-12-07 DIAGNOSIS — I5032 Chronic diastolic (congestive) heart failure: Secondary | ICD-10-CM

## 2020-12-07 DIAGNOSIS — N2581 Secondary hyperparathyroidism of renal origin: Secondary | ICD-10-CM | POA: Diagnosis not present

## 2020-12-07 DIAGNOSIS — D631 Anemia in chronic kidney disease: Secondary | ICD-10-CM

## 2020-12-07 DIAGNOSIS — N186 End stage renal disease: Secondary | ICD-10-CM

## 2020-12-07 DIAGNOSIS — Z794 Long term (current) use of insulin: Secondary | ICD-10-CM

## 2020-12-07 DIAGNOSIS — Z0001 Encounter for general adult medical examination with abnormal findings: Secondary | ICD-10-CM

## 2020-12-07 DIAGNOSIS — Z136 Encounter for screening for cardiovascular disorders: Secondary | ICD-10-CM

## 2020-12-07 DIAGNOSIS — E785 Hyperlipidemia, unspecified: Secondary | ICD-10-CM | POA: Diagnosis not present

## 2020-12-07 DIAGNOSIS — K0530 Chronic periodontitis, unspecified: Secondary | ICD-10-CM

## 2020-12-07 DIAGNOSIS — R300 Dysuria: Secondary | ICD-10-CM

## 2020-12-07 DIAGNOSIS — Z79899 Other long term (current) drug therapy: Secondary | ICD-10-CM

## 2020-12-08 DIAGNOSIS — I159 Secondary hypertension, unspecified: Secondary | ICD-10-CM | POA: Diagnosis not present

## 2020-12-08 DIAGNOSIS — D509 Iron deficiency anemia, unspecified: Secondary | ICD-10-CM | POA: Diagnosis not present

## 2020-12-08 DIAGNOSIS — N186 End stage renal disease: Secondary | ICD-10-CM | POA: Diagnosis not present

## 2020-12-08 DIAGNOSIS — D689 Coagulation defect, unspecified: Secondary | ICD-10-CM | POA: Diagnosis not present

## 2020-12-08 DIAGNOSIS — Z992 Dependence on renal dialysis: Secondary | ICD-10-CM | POA: Diagnosis not present

## 2020-12-08 DIAGNOSIS — N2581 Secondary hyperparathyroidism of renal origin: Secondary | ICD-10-CM | POA: Diagnosis not present

## 2020-12-08 DIAGNOSIS — E118 Type 2 diabetes mellitus with unspecified complications: Secondary | ICD-10-CM | POA: Diagnosis not present

## 2020-12-08 DIAGNOSIS — D631 Anemia in chronic kidney disease: Secondary | ICD-10-CM | POA: Diagnosis not present

## 2020-12-09 DIAGNOSIS — I272 Pulmonary hypertension, unspecified: Secondary | ICD-10-CM | POA: Diagnosis not present

## 2020-12-09 DIAGNOSIS — I517 Cardiomegaly: Secondary | ICD-10-CM | POA: Diagnosis not present

## 2020-12-09 LAB — LIPID PANEL
Cholesterol: 197 mg/dL (ref <200)
HDL: 54 mg/dL (ref >=50)
LDL Cholesterol (Calc): 121 mg/dL (calc) — ABNORMAL HIGH
Non-HDL Cholesterol (Calc): 143 mg/dL (calc) — ABNORMAL HIGH (ref <130)
Total CHOL/HDL Ratio: 3.6 (calc) (ref <5.0)
Triglycerides: 114 mg/dL (ref <150)

## 2020-12-09 LAB — URINALYSIS W MICROSCOPIC + REFLEX CULTURE
Bacteria, UA: NONE SEEN /HPF
Bilirubin Urine: NEGATIVE
Glucose, UA: NEGATIVE
Hgb urine dipstick: NEGATIVE
Hyaline Cast: NONE SEEN /LPF
Ketones, ur: NEGATIVE
Nitrites, Initial: NEGATIVE
Specific Gravity, Urine: 1.014 (ref 1.001–1.03)
pH: 8 (ref 5.0–8.0)

## 2020-12-09 LAB — COMPLETE METABOLIC PANEL WITH GFR
AG Ratio: 1.2 (calc) (ref 1.0–2.5)
ALT: 10 U/L (ref 6–29)
AST: 10 U/L (ref 10–35)
Albumin: 4.1 g/dL (ref 3.6–5.1)
Alkaline phosphatase (APISO): 57 U/L (ref 37–153)
BUN/Creatinine Ratio: 6 (calc) (ref 6–22)
BUN: 45 mg/dL — ABNORMAL HIGH (ref 7–25)
CO2: 33 mmol/L — ABNORMAL HIGH (ref 20–32)
Calcium: 9 mg/dL (ref 8.6–10.4)
Chloride: 95 mmol/L — ABNORMAL LOW (ref 98–110)
Creat: 6.93 mg/dL — ABNORMAL HIGH (ref 0.50–0.99)
GFR, Est African American: 7 mL/min/{1.73_m2} — ABNORMAL LOW (ref >=60)
GFR, Est Non African American: 6 mL/min/{1.73_m2} — ABNORMAL LOW (ref >=60)
Globulin: 3.4 g/dL (calc) (ref 1.9–3.7)
Glucose, Bld: 105 mg/dL — ABNORMAL HIGH (ref 65–99)
Potassium: 4.8 mmol/L (ref 3.5–5.3)
Sodium: 140 mmol/L (ref 135–146)
Total Bilirubin: 0.5 mg/dL (ref 0.2–1.2)
Total Protein: 7.5 g/dL (ref 6.1–8.1)

## 2020-12-09 LAB — CBC WITH DIFFERENTIAL/PLATELET
Absolute Monocytes: 547 cells/uL (ref 200–950)
Basophils Absolute: 60 cells/uL (ref 0–200)
Basophils Relative: 1.3 %
Eosinophils Absolute: 235 cells/uL (ref 15–500)
Eosinophils Relative: 5.1 %
HCT: 31.7 % — ABNORMAL LOW (ref 35.0–45.0)
Hemoglobin: 10.2 g/dL — ABNORMAL LOW (ref 11.7–15.5)
Lymphs Abs: 1191 cells/uL (ref 850–3900)
MCH: 26.6 pg — ABNORMAL LOW (ref 27.0–33.0)
MCHC: 32.2 g/dL (ref 32.0–36.0)
MCV: 82.8 fL (ref 80.0–100.0)
MPV: 12.4 fL (ref 7.5–12.5)
Monocytes Relative: 11.9 %
Neutro Abs: 2567 cells/uL (ref 1500–7800)
Neutrophils Relative %: 55.8 %
Platelets: 181 10*3/uL (ref 140–400)
RBC: 3.83 10*6/uL (ref 3.80–5.10)
RDW: 16.1 % — ABNORMAL HIGH (ref 11.0–15.0)
Total Lymphocyte: 25.9 %
WBC: 4.6 10*3/uL (ref 3.8–10.8)

## 2020-12-09 LAB — HEMOGLOBIN A1C
Hgb A1c MFr Bld: 5.7 % of total Hgb — ABNORMAL HIGH (ref <5.7)
Mean Plasma Glucose: 117 mg/dL
eAG (mmol/L): 6.5 mmol/L

## 2020-12-09 LAB — URINE CULTURE
MICRO NUMBER:: 11405894
SPECIMEN QUALITY:: ADEQUATE

## 2020-12-09 LAB — CULTURE INDICATED

## 2020-12-09 LAB — TSH: TSH: 2.8 mIU/L (ref 0.40–4.50)

## 2020-12-10 DIAGNOSIS — D509 Iron deficiency anemia, unspecified: Secondary | ICD-10-CM | POA: Diagnosis not present

## 2020-12-10 DIAGNOSIS — D689 Coagulation defect, unspecified: Secondary | ICD-10-CM | POA: Diagnosis not present

## 2020-12-10 DIAGNOSIS — Z7901 Long term (current) use of anticoagulants: Secondary | ICD-10-CM | POA: Diagnosis not present

## 2020-12-10 DIAGNOSIS — N2581 Secondary hyperparathyroidism of renal origin: Secondary | ICD-10-CM | POA: Diagnosis not present

## 2020-12-10 DIAGNOSIS — E118 Type 2 diabetes mellitus with unspecified complications: Secondary | ICD-10-CM | POA: Diagnosis not present

## 2020-12-10 DIAGNOSIS — D631 Anemia in chronic kidney disease: Secondary | ICD-10-CM | POA: Diagnosis not present

## 2020-12-10 DIAGNOSIS — N186 End stage renal disease: Secondary | ICD-10-CM | POA: Diagnosis not present

## 2020-12-10 DIAGNOSIS — Z992 Dependence on renal dialysis: Secondary | ICD-10-CM | POA: Diagnosis not present

## 2020-12-10 DIAGNOSIS — Z5181 Encounter for therapeutic drug level monitoring: Secondary | ICD-10-CM | POA: Diagnosis not present

## 2020-12-10 DIAGNOSIS — I159 Secondary hypertension, unspecified: Secondary | ICD-10-CM | POA: Diagnosis not present

## 2020-12-10 DIAGNOSIS — I4891 Unspecified atrial fibrillation: Secondary | ICD-10-CM | POA: Diagnosis not present

## 2020-12-13 DIAGNOSIS — N2581 Secondary hyperparathyroidism of renal origin: Secondary | ICD-10-CM | POA: Diagnosis not present

## 2020-12-13 DIAGNOSIS — N186 End stage renal disease: Secondary | ICD-10-CM | POA: Diagnosis not present

## 2020-12-13 DIAGNOSIS — E118 Type 2 diabetes mellitus with unspecified complications: Secondary | ICD-10-CM | POA: Diagnosis not present

## 2020-12-13 DIAGNOSIS — D509 Iron deficiency anemia, unspecified: Secondary | ICD-10-CM | POA: Diagnosis not present

## 2020-12-13 DIAGNOSIS — D631 Anemia in chronic kidney disease: Secondary | ICD-10-CM | POA: Diagnosis not present

## 2020-12-13 DIAGNOSIS — D689 Coagulation defect, unspecified: Secondary | ICD-10-CM | POA: Diagnosis not present

## 2020-12-13 DIAGNOSIS — I159 Secondary hypertension, unspecified: Secondary | ICD-10-CM | POA: Diagnosis not present

## 2020-12-13 DIAGNOSIS — Z992 Dependence on renal dialysis: Secondary | ICD-10-CM | POA: Diagnosis not present

## 2020-12-15 DIAGNOSIS — D631 Anemia in chronic kidney disease: Secondary | ICD-10-CM | POA: Diagnosis not present

## 2020-12-15 DIAGNOSIS — I159 Secondary hypertension, unspecified: Secondary | ICD-10-CM | POA: Diagnosis not present

## 2020-12-15 DIAGNOSIS — N186 End stage renal disease: Secondary | ICD-10-CM | POA: Diagnosis not present

## 2020-12-15 DIAGNOSIS — D509 Iron deficiency anemia, unspecified: Secondary | ICD-10-CM | POA: Diagnosis not present

## 2020-12-15 DIAGNOSIS — E118 Type 2 diabetes mellitus with unspecified complications: Secondary | ICD-10-CM | POA: Diagnosis not present

## 2020-12-15 DIAGNOSIS — N2581 Secondary hyperparathyroidism of renal origin: Secondary | ICD-10-CM | POA: Diagnosis not present

## 2020-12-15 DIAGNOSIS — D689 Coagulation defect, unspecified: Secondary | ICD-10-CM | POA: Diagnosis not present

## 2020-12-15 DIAGNOSIS — Z992 Dependence on renal dialysis: Secondary | ICD-10-CM | POA: Diagnosis not present

## 2020-12-17 DIAGNOSIS — N2581 Secondary hyperparathyroidism of renal origin: Secondary | ICD-10-CM | POA: Diagnosis not present

## 2020-12-17 DIAGNOSIS — N186 End stage renal disease: Secondary | ICD-10-CM | POA: Diagnosis not present

## 2020-12-17 DIAGNOSIS — I4891 Unspecified atrial fibrillation: Secondary | ICD-10-CM | POA: Diagnosis not present

## 2020-12-17 DIAGNOSIS — Z992 Dependence on renal dialysis: Secondary | ICD-10-CM | POA: Diagnosis not present

## 2020-12-17 DIAGNOSIS — D631 Anemia in chronic kidney disease: Secondary | ICD-10-CM | POA: Diagnosis not present

## 2020-12-17 DIAGNOSIS — Z5181 Encounter for therapeutic drug level monitoring: Secondary | ICD-10-CM | POA: Diagnosis not present

## 2020-12-17 DIAGNOSIS — E118 Type 2 diabetes mellitus with unspecified complications: Secondary | ICD-10-CM | POA: Diagnosis not present

## 2020-12-17 DIAGNOSIS — D509 Iron deficiency anemia, unspecified: Secondary | ICD-10-CM | POA: Diagnosis not present

## 2020-12-17 DIAGNOSIS — D689 Coagulation defect, unspecified: Secondary | ICD-10-CM | POA: Diagnosis not present

## 2020-12-17 DIAGNOSIS — I159 Secondary hypertension, unspecified: Secondary | ICD-10-CM | POA: Diagnosis not present

## 2020-12-17 DIAGNOSIS — Z7901 Long term (current) use of anticoagulants: Secondary | ICD-10-CM | POA: Diagnosis not present

## 2020-12-20 DIAGNOSIS — N186 End stage renal disease: Secondary | ICD-10-CM | POA: Diagnosis not present

## 2020-12-20 DIAGNOSIS — I159 Secondary hypertension, unspecified: Secondary | ICD-10-CM | POA: Diagnosis not present

## 2020-12-20 DIAGNOSIS — E118 Type 2 diabetes mellitus with unspecified complications: Secondary | ICD-10-CM | POA: Diagnosis not present

## 2020-12-20 DIAGNOSIS — Z992 Dependence on renal dialysis: Secondary | ICD-10-CM | POA: Diagnosis not present

## 2020-12-20 DIAGNOSIS — D631 Anemia in chronic kidney disease: Secondary | ICD-10-CM | POA: Diagnosis not present

## 2020-12-20 DIAGNOSIS — D689 Coagulation defect, unspecified: Secondary | ICD-10-CM | POA: Diagnosis not present

## 2020-12-20 DIAGNOSIS — N2581 Secondary hyperparathyroidism of renal origin: Secondary | ICD-10-CM | POA: Diagnosis not present

## 2020-12-20 DIAGNOSIS — D509 Iron deficiency anemia, unspecified: Secondary | ICD-10-CM | POA: Diagnosis not present

## 2020-12-22 DIAGNOSIS — D689 Coagulation defect, unspecified: Secondary | ICD-10-CM | POA: Diagnosis not present

## 2020-12-22 DIAGNOSIS — N186 End stage renal disease: Secondary | ICD-10-CM | POA: Diagnosis not present

## 2020-12-22 DIAGNOSIS — N2581 Secondary hyperparathyroidism of renal origin: Secondary | ICD-10-CM | POA: Diagnosis not present

## 2020-12-22 DIAGNOSIS — D631 Anemia in chronic kidney disease: Secondary | ICD-10-CM | POA: Diagnosis not present

## 2020-12-22 DIAGNOSIS — E118 Type 2 diabetes mellitus with unspecified complications: Secondary | ICD-10-CM | POA: Diagnosis not present

## 2020-12-22 DIAGNOSIS — D509 Iron deficiency anemia, unspecified: Secondary | ICD-10-CM | POA: Diagnosis not present

## 2020-12-22 DIAGNOSIS — I159 Secondary hypertension, unspecified: Secondary | ICD-10-CM | POA: Diagnosis not present

## 2020-12-22 DIAGNOSIS — Z992 Dependence on renal dialysis: Secondary | ICD-10-CM | POA: Diagnosis not present

## 2020-12-24 DIAGNOSIS — I159 Secondary hypertension, unspecified: Secondary | ICD-10-CM | POA: Diagnosis not present

## 2020-12-24 DIAGNOSIS — Z7901 Long term (current) use of anticoagulants: Secondary | ICD-10-CM | POA: Diagnosis not present

## 2020-12-24 DIAGNOSIS — D631 Anemia in chronic kidney disease: Secondary | ICD-10-CM | POA: Diagnosis not present

## 2020-12-24 DIAGNOSIS — D509 Iron deficiency anemia, unspecified: Secondary | ICD-10-CM | POA: Diagnosis not present

## 2020-12-24 DIAGNOSIS — N2581 Secondary hyperparathyroidism of renal origin: Secondary | ICD-10-CM | POA: Diagnosis not present

## 2020-12-24 DIAGNOSIS — D689 Coagulation defect, unspecified: Secondary | ICD-10-CM | POA: Diagnosis not present

## 2020-12-24 DIAGNOSIS — Z992 Dependence on renal dialysis: Secondary | ICD-10-CM | POA: Diagnosis not present

## 2020-12-24 DIAGNOSIS — I4891 Unspecified atrial fibrillation: Secondary | ICD-10-CM | POA: Diagnosis not present

## 2020-12-24 DIAGNOSIS — E118 Type 2 diabetes mellitus with unspecified complications: Secondary | ICD-10-CM | POA: Diagnosis not present

## 2020-12-24 DIAGNOSIS — Z5181 Encounter for therapeutic drug level monitoring: Secondary | ICD-10-CM | POA: Diagnosis not present

## 2020-12-24 DIAGNOSIS — N186 End stage renal disease: Secondary | ICD-10-CM | POA: Diagnosis not present

## 2020-12-27 DIAGNOSIS — N2581 Secondary hyperparathyroidism of renal origin: Secondary | ICD-10-CM | POA: Diagnosis not present

## 2020-12-27 DIAGNOSIS — D689 Coagulation defect, unspecified: Secondary | ICD-10-CM | POA: Diagnosis not present

## 2020-12-27 DIAGNOSIS — Z992 Dependence on renal dialysis: Secondary | ICD-10-CM | POA: Diagnosis not present

## 2020-12-27 DIAGNOSIS — I159 Secondary hypertension, unspecified: Secondary | ICD-10-CM | POA: Diagnosis not present

## 2020-12-27 DIAGNOSIS — E118 Type 2 diabetes mellitus with unspecified complications: Secondary | ICD-10-CM | POA: Diagnosis not present

## 2020-12-27 DIAGNOSIS — E1129 Type 2 diabetes mellitus with other diabetic kidney complication: Secondary | ICD-10-CM | POA: Diagnosis not present

## 2020-12-27 DIAGNOSIS — D509 Iron deficiency anemia, unspecified: Secondary | ICD-10-CM | POA: Diagnosis not present

## 2020-12-27 DIAGNOSIS — N186 End stage renal disease: Secondary | ICD-10-CM | POA: Diagnosis not present

## 2020-12-27 DIAGNOSIS — D631 Anemia in chronic kidney disease: Secondary | ICD-10-CM | POA: Diagnosis not present

## 2020-12-29 DIAGNOSIS — D631 Anemia in chronic kidney disease: Secondary | ICD-10-CM | POA: Diagnosis not present

## 2020-12-29 DIAGNOSIS — Z992 Dependence on renal dialysis: Secondary | ICD-10-CM | POA: Diagnosis not present

## 2020-12-29 DIAGNOSIS — N186 End stage renal disease: Secondary | ICD-10-CM | POA: Diagnosis not present

## 2020-12-29 DIAGNOSIS — D509 Iron deficiency anemia, unspecified: Secondary | ICD-10-CM | POA: Diagnosis not present

## 2020-12-29 DIAGNOSIS — E118 Type 2 diabetes mellitus with unspecified complications: Secondary | ICD-10-CM | POA: Diagnosis not present

## 2020-12-29 DIAGNOSIS — D689 Coagulation defect, unspecified: Secondary | ICD-10-CM | POA: Diagnosis not present

## 2020-12-29 DIAGNOSIS — I159 Secondary hypertension, unspecified: Secondary | ICD-10-CM | POA: Diagnosis not present

## 2020-12-29 DIAGNOSIS — R519 Headache, unspecified: Secondary | ICD-10-CM | POA: Diagnosis not present

## 2020-12-29 DIAGNOSIS — N2581 Secondary hyperparathyroidism of renal origin: Secondary | ICD-10-CM | POA: Diagnosis not present

## 2020-12-31 DIAGNOSIS — Z7901 Long term (current) use of anticoagulants: Secondary | ICD-10-CM | POA: Diagnosis not present

## 2020-12-31 DIAGNOSIS — N2581 Secondary hyperparathyroidism of renal origin: Secondary | ICD-10-CM | POA: Diagnosis not present

## 2020-12-31 DIAGNOSIS — Z5181 Encounter for therapeutic drug level monitoring: Secondary | ICD-10-CM | POA: Diagnosis not present

## 2020-12-31 DIAGNOSIS — I159 Secondary hypertension, unspecified: Secondary | ICD-10-CM | POA: Diagnosis not present

## 2020-12-31 DIAGNOSIS — R519 Headache, unspecified: Secondary | ICD-10-CM | POA: Diagnosis not present

## 2020-12-31 DIAGNOSIS — D631 Anemia in chronic kidney disease: Secondary | ICD-10-CM | POA: Diagnosis not present

## 2020-12-31 DIAGNOSIS — D509 Iron deficiency anemia, unspecified: Secondary | ICD-10-CM | POA: Diagnosis not present

## 2020-12-31 DIAGNOSIS — D689 Coagulation defect, unspecified: Secondary | ICD-10-CM | POA: Diagnosis not present

## 2020-12-31 DIAGNOSIS — I4891 Unspecified atrial fibrillation: Secondary | ICD-10-CM | POA: Diagnosis not present

## 2020-12-31 DIAGNOSIS — E118 Type 2 diabetes mellitus with unspecified complications: Secondary | ICD-10-CM | POA: Diagnosis not present

## 2020-12-31 DIAGNOSIS — N186 End stage renal disease: Secondary | ICD-10-CM | POA: Diagnosis not present

## 2020-12-31 DIAGNOSIS — Z992 Dependence on renal dialysis: Secondary | ICD-10-CM | POA: Diagnosis not present

## 2021-01-03 DIAGNOSIS — D509 Iron deficiency anemia, unspecified: Secondary | ICD-10-CM | POA: Diagnosis not present

## 2021-01-03 DIAGNOSIS — E118 Type 2 diabetes mellitus with unspecified complications: Secondary | ICD-10-CM | POA: Diagnosis not present

## 2021-01-03 DIAGNOSIS — R519 Headache, unspecified: Secondary | ICD-10-CM | POA: Diagnosis not present

## 2021-01-03 DIAGNOSIS — D689 Coagulation defect, unspecified: Secondary | ICD-10-CM | POA: Diagnosis not present

## 2021-01-03 DIAGNOSIS — D631 Anemia in chronic kidney disease: Secondary | ICD-10-CM | POA: Diagnosis not present

## 2021-01-03 DIAGNOSIS — Z992 Dependence on renal dialysis: Secondary | ICD-10-CM | POA: Diagnosis not present

## 2021-01-03 DIAGNOSIS — N2581 Secondary hyperparathyroidism of renal origin: Secondary | ICD-10-CM | POA: Diagnosis not present

## 2021-01-03 DIAGNOSIS — N186 End stage renal disease: Secondary | ICD-10-CM | POA: Diagnosis not present

## 2021-01-03 DIAGNOSIS — I159 Secondary hypertension, unspecified: Secondary | ICD-10-CM | POA: Diagnosis not present

## 2021-01-05 DIAGNOSIS — Z992 Dependence on renal dialysis: Secondary | ICD-10-CM | POA: Diagnosis not present

## 2021-01-05 DIAGNOSIS — D509 Iron deficiency anemia, unspecified: Secondary | ICD-10-CM | POA: Diagnosis not present

## 2021-01-05 DIAGNOSIS — N2581 Secondary hyperparathyroidism of renal origin: Secondary | ICD-10-CM | POA: Diagnosis not present

## 2021-01-05 DIAGNOSIS — E118 Type 2 diabetes mellitus with unspecified complications: Secondary | ICD-10-CM | POA: Diagnosis not present

## 2021-01-05 DIAGNOSIS — R519 Headache, unspecified: Secondary | ICD-10-CM | POA: Diagnosis not present

## 2021-01-05 DIAGNOSIS — D631 Anemia in chronic kidney disease: Secondary | ICD-10-CM | POA: Diagnosis not present

## 2021-01-05 DIAGNOSIS — N186 End stage renal disease: Secondary | ICD-10-CM | POA: Diagnosis not present

## 2021-01-05 DIAGNOSIS — I159 Secondary hypertension, unspecified: Secondary | ICD-10-CM | POA: Diagnosis not present

## 2021-01-05 DIAGNOSIS — D689 Coagulation defect, unspecified: Secondary | ICD-10-CM | POA: Diagnosis not present

## 2021-01-07 DIAGNOSIS — D689 Coagulation defect, unspecified: Secondary | ICD-10-CM | POA: Diagnosis not present

## 2021-01-07 DIAGNOSIS — Z5181 Encounter for therapeutic drug level monitoring: Secondary | ICD-10-CM | POA: Diagnosis not present

## 2021-01-07 DIAGNOSIS — Z992 Dependence on renal dialysis: Secondary | ICD-10-CM | POA: Diagnosis not present

## 2021-01-07 DIAGNOSIS — E118 Type 2 diabetes mellitus with unspecified complications: Secondary | ICD-10-CM | POA: Diagnosis not present

## 2021-01-07 DIAGNOSIS — D631 Anemia in chronic kidney disease: Secondary | ICD-10-CM | POA: Diagnosis not present

## 2021-01-07 DIAGNOSIS — R519 Headache, unspecified: Secondary | ICD-10-CM | POA: Diagnosis not present

## 2021-01-07 DIAGNOSIS — N2581 Secondary hyperparathyroidism of renal origin: Secondary | ICD-10-CM | POA: Diagnosis not present

## 2021-01-07 DIAGNOSIS — I159 Secondary hypertension, unspecified: Secondary | ICD-10-CM | POA: Diagnosis not present

## 2021-01-07 DIAGNOSIS — I272 Pulmonary hypertension, unspecified: Secondary | ICD-10-CM | POA: Diagnosis not present

## 2021-01-07 DIAGNOSIS — Z7901 Long term (current) use of anticoagulants: Secondary | ICD-10-CM | POA: Diagnosis not present

## 2021-01-07 DIAGNOSIS — N186 End stage renal disease: Secondary | ICD-10-CM | POA: Diagnosis not present

## 2021-01-07 DIAGNOSIS — D509 Iron deficiency anemia, unspecified: Secondary | ICD-10-CM | POA: Diagnosis not present

## 2021-01-10 DIAGNOSIS — Z992 Dependence on renal dialysis: Secondary | ICD-10-CM | POA: Diagnosis not present

## 2021-01-10 DIAGNOSIS — D631 Anemia in chronic kidney disease: Secondary | ICD-10-CM | POA: Diagnosis not present

## 2021-01-10 DIAGNOSIS — N2581 Secondary hyperparathyroidism of renal origin: Secondary | ICD-10-CM | POA: Diagnosis not present

## 2021-01-10 DIAGNOSIS — R519 Headache, unspecified: Secondary | ICD-10-CM | POA: Diagnosis not present

## 2021-01-10 DIAGNOSIS — I159 Secondary hypertension, unspecified: Secondary | ICD-10-CM | POA: Diagnosis not present

## 2021-01-10 DIAGNOSIS — E118 Type 2 diabetes mellitus with unspecified complications: Secondary | ICD-10-CM | POA: Diagnosis not present

## 2021-01-10 DIAGNOSIS — N186 End stage renal disease: Secondary | ICD-10-CM | POA: Diagnosis not present

## 2021-01-10 DIAGNOSIS — D689 Coagulation defect, unspecified: Secondary | ICD-10-CM | POA: Diagnosis not present

## 2021-01-10 DIAGNOSIS — D509 Iron deficiency anemia, unspecified: Secondary | ICD-10-CM | POA: Diagnosis not present

## 2021-01-11 DIAGNOSIS — I5032 Chronic diastolic (congestive) heart failure: Secondary | ICD-10-CM | POA: Diagnosis not present

## 2021-01-11 DIAGNOSIS — N185 Chronic kidney disease, stage 5: Secondary | ICD-10-CM | POA: Diagnosis not present

## 2021-01-11 DIAGNOSIS — Z992 Dependence on renal dialysis: Secondary | ICD-10-CM | POA: Diagnosis not present

## 2021-01-11 DIAGNOSIS — I10 Essential (primary) hypertension: Secondary | ICD-10-CM | POA: Diagnosis not present

## 2021-01-11 DIAGNOSIS — E1122 Type 2 diabetes mellitus with diabetic chronic kidney disease: Secondary | ICD-10-CM | POA: Diagnosis not present

## 2021-01-11 DIAGNOSIS — Z01818 Encounter for other preprocedural examination: Secondary | ICD-10-CM | POA: Diagnosis not present

## 2021-01-11 DIAGNOSIS — I272 Pulmonary hypertension, unspecified: Secondary | ICD-10-CM | POA: Diagnosis not present

## 2021-01-12 DIAGNOSIS — R519 Headache, unspecified: Secondary | ICD-10-CM | POA: Diagnosis not present

## 2021-01-12 DIAGNOSIS — N186 End stage renal disease: Secondary | ICD-10-CM | POA: Diagnosis not present

## 2021-01-12 DIAGNOSIS — D631 Anemia in chronic kidney disease: Secondary | ICD-10-CM | POA: Diagnosis not present

## 2021-01-12 DIAGNOSIS — Z992 Dependence on renal dialysis: Secondary | ICD-10-CM | POA: Diagnosis not present

## 2021-01-12 DIAGNOSIS — N2581 Secondary hyperparathyroidism of renal origin: Secondary | ICD-10-CM | POA: Diagnosis not present

## 2021-01-12 DIAGNOSIS — D689 Coagulation defect, unspecified: Secondary | ICD-10-CM | POA: Diagnosis not present

## 2021-01-12 DIAGNOSIS — D509 Iron deficiency anemia, unspecified: Secondary | ICD-10-CM | POA: Diagnosis not present

## 2021-01-12 DIAGNOSIS — E118 Type 2 diabetes mellitus with unspecified complications: Secondary | ICD-10-CM | POA: Diagnosis not present

## 2021-01-12 DIAGNOSIS — I159 Secondary hypertension, unspecified: Secondary | ICD-10-CM | POA: Diagnosis not present

## 2021-01-13 ENCOUNTER — Encounter (HOSPITAL_COMMUNITY): Payer: Self-pay

## 2021-01-13 ENCOUNTER — Ambulatory Visit (INDEPENDENT_AMBULATORY_CARE_PROVIDER_SITE_OTHER): Payer: Medicare Other

## 2021-01-13 ENCOUNTER — Ambulatory Visit (HOSPITAL_COMMUNITY)
Admission: EM | Admit: 2021-01-13 | Discharge: 2021-01-13 | Disposition: A | Discharge status: Home / Self Care | Payer: Medicare Other | Attending: Student | Admitting: Student

## 2021-01-13 DIAGNOSIS — M1712 Unilateral primary osteoarthritis, left knee: Secondary | ICD-10-CM | POA: Diagnosis not present

## 2021-01-13 DIAGNOSIS — M25462 Effusion, left knee: Secondary | ICD-10-CM

## 2021-01-13 DIAGNOSIS — Z992 Dependence on renal dialysis: Secondary | ICD-10-CM | POA: Diagnosis not present

## 2021-01-13 DIAGNOSIS — N186 End stage renal disease: Secondary | ICD-10-CM

## 2021-01-13 DIAGNOSIS — M7989 Other specified soft tissue disorders: Secondary | ICD-10-CM | POA: Diagnosis not present

## 2021-01-13 DIAGNOSIS — M25562 Pain in left knee: Secondary | ICD-10-CM | POA: Diagnosis not present

## 2021-01-13 NOTE — ED Triage Notes (Signed)
Pt presents with left knee pain and swelling x 2 days. Pt states she is unable to apply pressure to her knee. She states she has been using her cane. Pt states when she tries to raise up her knee it hurts.

## 2021-01-13 NOTE — Discharge Instructions (Addendum)
-  Use your knee brace during the day, particularly when moving about the house. Use this as long as you have pain.  -Continue using cane as needed.  -You can use tylenol for pain. Avoid NSAIDS like ibuprofen, due to your kidney disease.  -You can also try ice or heat, and elevating the leg.  -If your symptoms persist, follow-up with orthopedist. Information below.

## 2021-01-13 NOTE — ED Provider Notes (Signed)
[place]    CSN: [id number] Arrival date & time: 01/13/21  [id number]      History   Chief Complaint Chief Complaint  Patient presents with  . Leg Pain    left  . Leg Swelling    left    HPI CONSEPCION Nguyen is a 65 y.o. female presenting with left knee pain and swelling.  History anemia, CHF, CKD, constipation, CVA, diverticulitis, diabetes, heart murmur, hyperlipidemia, hypertension, pericardial effusion, pulmonary hypertension, respiratory failure, cardiac cath, mitral valve repair, stroke, mood obesity. Pt presents with left knee pain and swelling x 2 days, which started after climbing up to get into her truck.  At baseline she ambulates without cane; however, she has been using her cane for the last 2 days due to the pain and instability.  Reports significant pain with putting weight on the knee, and with flexion.  Endorses tenderness when she presses on the outside of her knee.  Also states that the knee looks swollen to her.  Denies calf pain, hip pain, back pain, pain radiating down leg, sensation changes in arms or legs, weakness in arms or legs.  Denies prior injury to this knee.  HPI  Past Medical History:  Diagnosis Date  . Anemia   . Chronic diastolic CHF (congestive heart failure) ([place])   . Chronic diastolic heart failure ([place])   . CKD (chronic kidney disease) stage 4, GFR 15-29 ml/min (HCC)    dialysis M/W/F  . Constipation   . CVA (cerebral infarction) 1997   no residual deficit  . Diverticulitis   . DM (diabetes mellitus) ([place])    type 2  . Heart murmur   . History of cardiovascular stress test    a. Myoview Oct 2012 showed EF 49%, no ischemia, LVE  . HLD (hyperlipidemia)    takes Crestor daily  . Hypertension   . Pericardial effusion    chronic; felt to be poss related to minoxidil >> DC'd  . Pulmonary hypertension ([place]) 06/18/2015  . Respiratory failure, acute hypoxic, post-operative 07/08/2015   Requiring ECMO support  . S/P cardiac  catheterization    a. R/L HC 06/18/15:  mLAD 30%; severe pulmo HTN with PA sat 43%, CI 1.86, prominent V waves indicative of MR; resting hypoxemia O2 sat 86% on RA  . S/P minimally invasive mitral valve repair 07/08/2015   Complex valvuloplasty including artificial Gore-tex neochord placement x10 and 26 mm Sorin Memo 3D Rechord ring annuloplasty via right mini thoracotomy approach  . Severe mitral regurgitation   . Shortness of breath dyspnea   . Stroke ([place]) 1997   no residual effect  . Vitamin D deficiency   . Wears glasses     Patient Active Problem List   Diagnosis Date Noted  . A-V fistula ([place]) 01/29/2020  . Secondary renal hyperparathyroidism ([place]) 07/28/2019  . Major depression in remission ([place]) 01/22/2019  . ESRD on dialysis ([place]) 10/09/2015  . Cardiomyopathy ([place]) 09/10/2015  . Atrial fibrillation ([place]) 09/10/2015  . S/P minimally invasive mitral valve repair 07/08/2015  . Chronic periodontitis 06/30/2015  . Chronic diastolic heart failure ([place])   . Pulmonary hypertension ([place]) 06/18/2015  . Morbid obesity (BMI 36.05)  03/29/2015  . Type 2 diabetes mellitus ([place]) 11/22/2014  . Vitamin D deficiency 03/25/2014  . Anemia in chronic kidney disease   . Essential hypertension 08/17/2011  . Hyperlipidemia associated with type 2 diabetes mellitus ([place]) 08/17/2011    Past Surgical History:  Procedure Laterality Date  . A/V  FISTULAGRAM Right 08/16/2017   Procedure: A/V Fistulagram;  Surgeon: Conrad Tunica, MD;  Location: [facility];  Service: Cardiovascular;  Laterality: Right;  . AV FISTULA PLACEMENT Left 10/22/2015   Procedure: RADIOCEPHALIC ARTERIOVENOUS (AV) FISTULA CREATION;  Surgeon: Serafina Mitchell, MD;  Location: [facility];  Service: Vascular;  Laterality: Left;  . AV FISTULA PLACEMENT Right 05/22/2017   Procedure: ARTERIOVENOUS (AV) FISTULA CREATION-RIGHT ARM;  Surgeon: Waynetta Sandy, MD;  Location: [facility];  Service: Vascular;  Laterality: Right;  .  CANNULATION FOR CARDIOPULMONARY BYPASS N/A 07/08/2015   Procedure: CANNULATION FOR ECMO;  Surgeon: Rexene Alberts, MD;  Location: [facility];  Service: Open Heart Surgery;  Laterality: N/A;  . CARDIAC CATHETERIZATION    . CARDIAC CATHETERIZATION N/A 06/18/2015   Procedure: Right/Left Heart Cath and Coronary Angiography;  Surgeon: Jettie Booze, MD;  Location: [facility];  Service: Cardiovascular;  Laterality: N/A;  . CESAREAN SECTION     x 2  . COLONOSCOPY    . EXCHANGE OF A DIALYSIS CATHETER N/A 06/28/2017   Procedure: EXCHANGE OF A DIALYSIS CATHETER;  Surgeon: Waynetta Sandy, MD;  Location: [facility];  Service: Vascular;  Laterality: N/A;  . FISTULA SUPERFICIALIZATION Left 12/28/2015   Procedure: SUPERFICIALIZATION LEFT RADIOCEPHALIC FISTULA;  Surgeon: Serafina Mitchell, MD;  Location: [facility];  Service: Vascular;  Laterality: Left;  . FISTULA SUPERFICIALIZATION Right 08/21/2017   Procedure: FISTULA SUPERFICIALIZATION RIGHT ARM;  Surgeon: Waynetta Sandy, MD;  Location: [facility];  Service: Vascular;  Laterality: Right;  . INSERTION OF DIALYSIS CATHETER Right 04/28/2017   Procedure: INSERTION OF DIALYSIS CATHETER-RIGHT INTERNAL JUGULAR PLACEMENT;  Surgeon: Elam Dutch, MD;  Location: [facility];  Service: Vascular;  Laterality: Right;  . LIGATION OF ARTERIOVENOUS  FISTULA Left 04/28/2017   Procedure: LIGATION OF ARTERIOVENOUS  FISTULA;  Surgeon: Elam Dutch, MD;  Location: [facility];  Service: Vascular;  Laterality: Left;  . MITRAL VALVE REPAIR Right 07/08/2015   Procedure: MINIMALLY INVASIVE MITRAL VALVE REPAIR with a 26 Sorin Memo 3D Rechord;  Surgeon: Rexene Alberts, MD;  Location: [facility];  Service: Open Heart Surgery;  Laterality: Right;  . MULTIPLE EXTRACTIONS WITH ALVEOLOPLASTY N/A 06/30/2015   Procedure: MULTIPLE EXTRACTIONS OF TOOTH #'S 4  AND 30 WITH ALVEOLOPLASTY AND GROSS DEBRIDEMENT  OF REMAINING TEETH;  Surgeon: Lenn Cal, DDS;  Location: [facility];  Service: Oral  Surgery;  Laterality: N/A;  . PERIPHERAL VASCULAR CATHETERIZATION Left 03/28/2016   Procedure: A/V Fistulagram;  Surgeon: Serafina Mitchell, MD;  Location: [facility];  Service: Cardiovascular;  Laterality: Left;  lower arm  . TEE WITHOUT CARDIOVERSION N/A 06/08/2015   Procedure: TRANSESOPHAGEAL ECHOCARDIOGRAM (TEE);  Surgeon: Lelon Perla, MD;  Location: [facility];  Service: Cardiovascular;  Laterality: N/A;  . TEE WITHOUT CARDIOVERSION N/A 07/08/2015   Procedure: TRANSESOPHAGEAL ECHOCARDIOGRAM (TEE);  Surgeon: Rexene Alberts, MD;  Location: [facility];  Service: Open Heart Surgery;  Laterality: N/A;    OB History    Gravida  3   Para  3   Term  3   Preterm      AB      Living        SAB      IAB      Ectopic      Multiple      Live Births               Home Medications  Prior to Admission medications   Medication Sig Start Date End Date Taking? Authorizing Provider  warfarin (COUMADIN) 2.5 MG tablet Take 2 tablets (5 mg) daily, or as directed by the [facility] [id number]). 09/27/20  Yes [provider]  acetaminophen (TYLENOL) 500 MG tablet Take 1,000 mg by mouth every 6 (six) hours as needed (for pain.).    [provider]  aspirin EC 81 MG tablet Take 81 mg by mouth daily.    [provider]  azelastine (ASTELIN) 0.1 % nasal spray Use 1 to 2 sprays each nostril 2 to 3 x / day 03/26/18   Unk Pinto, MD  busPIRone (BUSPAR) 10 MG tablet Take 1 tablet 2 x /day for Anxiety 03/19/20   Unk Pinto, MD  carvedilol (COREG) 25 MG tablet Take 1 tablet 2 x /day for BP & Heart 05/20/20   Unk Pinto, MD  cinacalcet (SENSIPAR) 60 MG tablet Take 1 tablet (60 mg total) by mouth daily. 09/11/17   Unk Pinto, MD  hydrALAZINE (APRESOLINE) 50 MG tablet Take 1 tablet (50 mg total) by mouth 3 (three) times daily. FOR BLOOD  PRESSURE AND HEART 11/09/20   Lelon Perla, MD  latanoprost (XALATAN) 0.005 % ophthalmic  solution Place 1 drop into both eyes daily. 09/16/18   [provider]  losartan (COZAAR) 50 MG tablet Take 1 tablet Daily for BP 03/19/20   Unk Pinto, MD  Multiple Vitamins-Minerals (ZINC PO) Take by mouth.    [provider]  ondansetron (ZOFRAN ODT) 4 MG disintegrating tablet Take 1 tablet (4 mg total) by mouth every 8 (eight) hours as needed for nausea or vomiting. 02/23/19   Nils Flack, Mina A, PA-C  RENVELA 800 MG tablet Take 1,600-4,000 mg by mouth 3 (three) times daily. 4000 mg (5 caps) with meals and 1600 mg (2 caps) with snacks 12/21/15   [provider]  rosuvastatin (CRESTOR) 20 MG tablet Take 1 tablet Daily for Cholesterol 03/19/20   Unk Pinto, MD  sertraline (ZOLOFT) 25 MG tablet Take 1 tablet Daily for Mood 03/19/20   Unk Pinto, MD  sildenafil (REVATIO) 20 MG tablet Take 2 tablets (40 mg) 3 x  /day fr Pulmonary Hypertension 03/26/20   Unk Pinto, MD    Family History Family History  Problem Relation Age of Onset  . Cancer Sister        breast cancer  . Stroke Mother   . Heart attack Brother        MI in his 52s  . Heart disease Brother        before age 58  . Breast cancer Sister     Social History Social History   Tobacco Use  . Smoking status: Never Smoker  . Smokeless tobacco: Never Used  Vaping Use  . Vaping Use: Never used  Substance Use Topics  . Alcohol use: No    Alcohol/week: 0.0 standard drinks  . Drug use: No     Allergies   Minoxidil, Lipitor [atorvastatin], Morphine and related, and Ace inhibitors   Review of Systems Review of Systems  Musculoskeletal:       Left knee pain      Physical Exam Triage Vital Signs ED Triage Vitals  Enc Vitals Group     BP 01/13/21 1007 131/63     Pulse Rate 01/13/21 1007 73     Resp 01/13/21 1007 17     Temp 01/13/21 1007 97.9 F (36.6 C)     Temp Source 01/13/21  1007 Oral     SpO2 01/13/21 1007 99 %     Weight --      Height --      Head Circumference --       Peak Flow --      Pain Score 01/13/21 1005 7     Pain Loc --      Pain Edu? --      Excl. in [place]? --    No data found.  Updated Vital Signs BP 131/63 (BP Location: Left Arm)   Pulse 73   Temp 97.9 F (36.6 C) (Oral)   Resp 17   SpO2 99%   Visual Acuity Right Eye Distance:   Left Eye Distance:   Bilateral Distance:    Right Eye Near:   Left Eye Near:    Bilateral Near:     Physical Exam Vitals reviewed.  Constitutional:      Appearance: Normal appearance.  Cardiovascular:     Rate and Rhythm: Normal rate and regular rhythm.     Heart sounds: Normal heart sounds.  Pulmonary:     Effort: Pulmonary effort is normal.     Breath sounds: Normal breath sounds.  Musculoskeletal:     Left knee: Swelling and crepitus present. No erythema, ecchymosis or lacerations. Normal range of motion. Tenderness present over the lateral joint line. No medial joint line, MCL, LCL, ACL, PCL or patellar tendon tenderness. No LCL laxity, MCL laxity, ACL laxity or PCL laxity.Normal meniscus. Normal pulse.     Instability Tests: Anterior drawer test negative. Posterior drawer test negative. Anterior Lachman test negative. Medial McMurray test negative and lateral McMurray test negative.     Comments: L knee with crepitus. Lateral jointline tenderness to deep palpation. Knee is mildly edematous. Negative homan sign.   Patient ambulating with cane, favoring L knee.   Neurological:     General: No focal deficit present.     Mental Status: She is alert and oriented to person, place, and time.  Psychiatric:        Mood and Affect: Mood normal.        Behavior: Behavior normal.        Thought Content: Thought content normal.        Judgment: Judgment normal.      UC Treatments / Results  Labs (all labs ordered are listed, but only abnormal results are displayed) Labs Reviewed - No data to display  EKG   Radiology DG Knee Complete 4 Views Left  Result Date: 01/13/2021 CLINICAL DATA:   Knee pain, swelling EXAM: LEFT KNEE - COMPLETE 4+ VIEW COMPARISON:  None. FINDINGS: Early joint space narrowing in the patellofemoral compartment. Early spurring throughout the knee joint. Moderate joint effusion. No acute bony abnormality. Specifically, no fracture, subluxation, or dislocation. IMPRESSION: Mild degenerative changes. Moderate joint effusion. No acute bony abnormality. Electronically Signed   By: Rolm Baptise M.D.   On: 01/13/2021 11:56    Procedures Procedures (including critical care time)  Medications Ordered in UC Medications - No data to display  Initial Impression / Assessment and Plan / UC Course  I have reviewed the triage vital signs and the nursing notes.  Pertinent labs & imaging results that were available during my care of the patient were reviewed by me and considered in my medical decision making (see chart for details).       This patient is a 65 year old female presenting with L knee pain and swelling.   L knee xray Mild  degenerative changes. Moderate joint effusion. No acute bony abnormality.  Rec RICE, knee brace, continue using cane as necessary. Ortho f/u if symptoms persist; information provided.  Spent over 40 minutes obtaining H&P, performing physical, interpreting films, discussing results, treatment plan and plan for follow-up with patient. Patient agrees with plan.   This chart was dictated using voice recognition software, Dragon. Despite the best efforts of this provider to proofread and correct errors, errors may still occur which can change documentation meaning.    Final Clinical Impressions(s) / UC Diagnoses   Final diagnoses:  Arthritis of left knee  ESRD on dialysis [facility])     Discharge Instructions     -Use your knee brace during the day, particularly when moving about the house. Use this as long as you have pain.  -Continue using cane as needed.  -You can use tylenol for pain. Avoid NSAIDS like ibuprofen, due to your  kidney disease.  -You can also try ice or heat, and elevating the leg.  -If your symptoms persist, follow-up with orthopedist. Information below.     ED Prescriptions    None     PDMP not reviewed this encounter.   Hazel Sams, PA-C 01/13/21 1252

## 2021-01-14 DIAGNOSIS — D631 Anemia in chronic kidney disease: Secondary | ICD-10-CM | POA: Diagnosis not present

## 2021-01-14 DIAGNOSIS — D689 Coagulation defect, unspecified: Secondary | ICD-10-CM | POA: Diagnosis not present

## 2021-01-14 DIAGNOSIS — Z7901 Long term (current) use of anticoagulants: Secondary | ICD-10-CM | POA: Diagnosis not present

## 2021-01-14 DIAGNOSIS — R519 Headache, unspecified: Secondary | ICD-10-CM | POA: Diagnosis not present

## 2021-01-14 DIAGNOSIS — D509 Iron deficiency anemia, unspecified: Secondary | ICD-10-CM | POA: Diagnosis not present

## 2021-01-14 DIAGNOSIS — N2581 Secondary hyperparathyroidism of renal origin: Secondary | ICD-10-CM | POA: Diagnosis not present

## 2021-01-14 DIAGNOSIS — N186 End stage renal disease: Secondary | ICD-10-CM | POA: Diagnosis not present

## 2021-01-14 DIAGNOSIS — Z992 Dependence on renal dialysis: Secondary | ICD-10-CM | POA: Diagnosis not present

## 2021-01-14 DIAGNOSIS — Z5181 Encounter for therapeutic drug level monitoring: Secondary | ICD-10-CM | POA: Diagnosis not present

## 2021-01-14 DIAGNOSIS — E118 Type 2 diabetes mellitus with unspecified complications: Secondary | ICD-10-CM | POA: Diagnosis not present

## 2021-01-14 DIAGNOSIS — I159 Secondary hypertension, unspecified: Secondary | ICD-10-CM | POA: Diagnosis not present

## 2021-01-14 DIAGNOSIS — I4891 Unspecified atrial fibrillation: Secondary | ICD-10-CM | POA: Diagnosis not present

## 2021-01-17 DIAGNOSIS — E118 Type 2 diabetes mellitus with unspecified complications: Secondary | ICD-10-CM | POA: Diagnosis not present

## 2021-01-17 DIAGNOSIS — D631 Anemia in chronic kidney disease: Secondary | ICD-10-CM | POA: Diagnosis not present

## 2021-01-17 DIAGNOSIS — D509 Iron deficiency anemia, unspecified: Secondary | ICD-10-CM | POA: Diagnosis not present

## 2021-01-17 DIAGNOSIS — Z992 Dependence on renal dialysis: Secondary | ICD-10-CM | POA: Diagnosis not present

## 2021-01-17 DIAGNOSIS — N2581 Secondary hyperparathyroidism of renal origin: Secondary | ICD-10-CM | POA: Diagnosis not present

## 2021-01-17 DIAGNOSIS — N186 End stage renal disease: Secondary | ICD-10-CM | POA: Diagnosis not present

## 2021-01-17 DIAGNOSIS — I159 Secondary hypertension, unspecified: Secondary | ICD-10-CM | POA: Diagnosis not present

## 2021-01-17 DIAGNOSIS — R519 Headache, unspecified: Secondary | ICD-10-CM | POA: Diagnosis not present

## 2021-01-17 DIAGNOSIS — D689 Coagulation defect, unspecified: Secondary | ICD-10-CM | POA: Diagnosis not present

## 2021-01-18 DIAGNOSIS — N281 Cyst of kidney, acquired: Secondary | ICD-10-CM | POA: Diagnosis not present

## 2021-01-18 DIAGNOSIS — R590 Localized enlarged lymph nodes: Secondary | ICD-10-CM | POA: Diagnosis not present

## 2021-01-18 DIAGNOSIS — I272 Pulmonary hypertension, unspecified: Secondary | ICD-10-CM | POA: Diagnosis not present

## 2021-01-19 DIAGNOSIS — N186 End stage renal disease: Secondary | ICD-10-CM | POA: Diagnosis not present

## 2021-01-19 DIAGNOSIS — I159 Secondary hypertension, unspecified: Secondary | ICD-10-CM | POA: Diagnosis not present

## 2021-01-19 DIAGNOSIS — D689 Coagulation defect, unspecified: Secondary | ICD-10-CM | POA: Diagnosis not present

## 2021-01-19 DIAGNOSIS — Z992 Dependence on renal dialysis: Secondary | ICD-10-CM | POA: Diagnosis not present

## 2021-01-19 DIAGNOSIS — R519 Headache, unspecified: Secondary | ICD-10-CM | POA: Diagnosis not present

## 2021-01-19 DIAGNOSIS — N2581 Secondary hyperparathyroidism of renal origin: Secondary | ICD-10-CM | POA: Diagnosis not present

## 2021-01-19 DIAGNOSIS — E118 Type 2 diabetes mellitus with unspecified complications: Secondary | ICD-10-CM | POA: Diagnosis not present

## 2021-01-19 DIAGNOSIS — D509 Iron deficiency anemia, unspecified: Secondary | ICD-10-CM | POA: Diagnosis not present

## 2021-01-19 DIAGNOSIS — D631 Anemia in chronic kidney disease: Secondary | ICD-10-CM | POA: Diagnosis not present

## 2021-01-21 DIAGNOSIS — I159 Secondary hypertension, unspecified: Secondary | ICD-10-CM | POA: Diagnosis not present

## 2021-01-21 DIAGNOSIS — D509 Iron deficiency anemia, unspecified: Secondary | ICD-10-CM | POA: Diagnosis not present

## 2021-01-21 DIAGNOSIS — E118 Type 2 diabetes mellitus with unspecified complications: Secondary | ICD-10-CM | POA: Diagnosis not present

## 2021-01-21 DIAGNOSIS — I4891 Unspecified atrial fibrillation: Secondary | ICD-10-CM | POA: Diagnosis not present

## 2021-01-21 DIAGNOSIS — Z992 Dependence on renal dialysis: Secondary | ICD-10-CM | POA: Diagnosis not present

## 2021-01-21 DIAGNOSIS — D631 Anemia in chronic kidney disease: Secondary | ICD-10-CM | POA: Diagnosis not present

## 2021-01-21 DIAGNOSIS — N2581 Secondary hyperparathyroidism of renal origin: Secondary | ICD-10-CM | POA: Diagnosis not present

## 2021-01-21 DIAGNOSIS — D689 Coagulation defect, unspecified: Secondary | ICD-10-CM | POA: Diagnosis not present

## 2021-01-21 DIAGNOSIS — Z5181 Encounter for therapeutic drug level monitoring: Secondary | ICD-10-CM | POA: Diagnosis not present

## 2021-01-21 DIAGNOSIS — R519 Headache, unspecified: Secondary | ICD-10-CM | POA: Diagnosis not present

## 2021-01-21 DIAGNOSIS — Z7901 Long term (current) use of anticoagulants: Secondary | ICD-10-CM | POA: Diagnosis not present

## 2021-01-21 DIAGNOSIS — N186 End stage renal disease: Secondary | ICD-10-CM | POA: Diagnosis not present

## 2021-01-24 DIAGNOSIS — D631 Anemia in chronic kidney disease: Secondary | ICD-10-CM | POA: Diagnosis not present

## 2021-01-24 DIAGNOSIS — N2581 Secondary hyperparathyroidism of renal origin: Secondary | ICD-10-CM | POA: Diagnosis not present

## 2021-01-24 DIAGNOSIS — D509 Iron deficiency anemia, unspecified: Secondary | ICD-10-CM | POA: Diagnosis not present

## 2021-01-24 DIAGNOSIS — N186 End stage renal disease: Secondary | ICD-10-CM | POA: Diagnosis not present

## 2021-01-24 DIAGNOSIS — E118 Type 2 diabetes mellitus with unspecified complications: Secondary | ICD-10-CM | POA: Diagnosis not present

## 2021-01-24 DIAGNOSIS — D689 Coagulation defect, unspecified: Secondary | ICD-10-CM | POA: Diagnosis not present

## 2021-01-24 DIAGNOSIS — R519 Headache, unspecified: Secondary | ICD-10-CM | POA: Diagnosis not present

## 2021-01-24 DIAGNOSIS — Z992 Dependence on renal dialysis: Secondary | ICD-10-CM | POA: Diagnosis not present

## 2021-01-24 DIAGNOSIS — I159 Secondary hypertension, unspecified: Secondary | ICD-10-CM | POA: Diagnosis not present

## 2021-01-24 DIAGNOSIS — E1129 Type 2 diabetes mellitus with other diabetic kidney complication: Secondary | ICD-10-CM | POA: Diagnosis not present

## 2021-01-25 DIAGNOSIS — Z952 Presence of prosthetic heart valve: Secondary | ICD-10-CM | POA: Diagnosis not present

## 2021-01-25 DIAGNOSIS — I272 Pulmonary hypertension, unspecified: Secondary | ICD-10-CM | POA: Diagnosis not present

## 2021-01-25 DIAGNOSIS — I358 Other nonrheumatic aortic valve disorders: Secondary | ICD-10-CM | POA: Diagnosis not present

## 2021-01-25 DIAGNOSIS — I071 Rheumatic tricuspid insufficiency: Secondary | ICD-10-CM | POA: Diagnosis not present

## 2021-01-26 DIAGNOSIS — I159 Secondary hypertension, unspecified: Secondary | ICD-10-CM | POA: Diagnosis not present

## 2021-01-26 DIAGNOSIS — N2581 Secondary hyperparathyroidism of renal origin: Secondary | ICD-10-CM | POA: Diagnosis not present

## 2021-01-26 DIAGNOSIS — D689 Coagulation defect, unspecified: Secondary | ICD-10-CM | POA: Diagnosis not present

## 2021-01-26 DIAGNOSIS — N186 End stage renal disease: Secondary | ICD-10-CM | POA: Diagnosis not present

## 2021-01-26 DIAGNOSIS — Z992 Dependence on renal dialysis: Secondary | ICD-10-CM | POA: Diagnosis not present

## 2021-01-26 DIAGNOSIS — D509 Iron deficiency anemia, unspecified: Secondary | ICD-10-CM | POA: Diagnosis not present

## 2021-01-27 DIAGNOSIS — Z5181 Encounter for therapeutic drug level monitoring: Secondary | ICD-10-CM | POA: Diagnosis not present

## 2021-01-27 DIAGNOSIS — I4891 Unspecified atrial fibrillation: Secondary | ICD-10-CM | POA: Diagnosis not present

## 2021-01-27 DIAGNOSIS — Z7901 Long term (current) use of anticoagulants: Secondary | ICD-10-CM | POA: Diagnosis not present

## 2021-01-28 DIAGNOSIS — I159 Secondary hypertension, unspecified: Secondary | ICD-10-CM | POA: Diagnosis not present

## 2021-01-28 DIAGNOSIS — N2581 Secondary hyperparathyroidism of renal origin: Secondary | ICD-10-CM | POA: Diagnosis not present

## 2021-01-28 DIAGNOSIS — D689 Coagulation defect, unspecified: Secondary | ICD-10-CM | POA: Diagnosis not present

## 2021-01-28 DIAGNOSIS — D509 Iron deficiency anemia, unspecified: Secondary | ICD-10-CM | POA: Diagnosis not present

## 2021-01-28 DIAGNOSIS — Z992 Dependence on renal dialysis: Secondary | ICD-10-CM | POA: Diagnosis not present

## 2021-01-28 DIAGNOSIS — N186 End stage renal disease: Secondary | ICD-10-CM | POA: Diagnosis not present

## 2021-01-31 DIAGNOSIS — N2581 Secondary hyperparathyroidism of renal origin: Secondary | ICD-10-CM | POA: Diagnosis not present

## 2021-01-31 DIAGNOSIS — D689 Coagulation defect, unspecified: Secondary | ICD-10-CM | POA: Diagnosis not present

## 2021-01-31 DIAGNOSIS — N186 End stage renal disease: Secondary | ICD-10-CM | POA: Diagnosis not present

## 2021-01-31 DIAGNOSIS — Z992 Dependence on renal dialysis: Secondary | ICD-10-CM | POA: Diagnosis not present

## 2021-01-31 DIAGNOSIS — D631 Anemia in chronic kidney disease: Secondary | ICD-10-CM | POA: Diagnosis not present

## 2021-01-31 DIAGNOSIS — I159 Secondary hypertension, unspecified: Secondary | ICD-10-CM | POA: Diagnosis not present

## 2021-02-02 DIAGNOSIS — D631 Anemia in chronic kidney disease: Secondary | ICD-10-CM | POA: Diagnosis not present

## 2021-02-02 DIAGNOSIS — Z992 Dependence on renal dialysis: Secondary | ICD-10-CM | POA: Diagnosis not present

## 2021-02-02 DIAGNOSIS — N2581 Secondary hyperparathyroidism of renal origin: Secondary | ICD-10-CM | POA: Diagnosis not present

## 2021-02-02 DIAGNOSIS — I159 Secondary hypertension, unspecified: Secondary | ICD-10-CM | POA: Diagnosis not present

## 2021-02-02 DIAGNOSIS — N186 End stage renal disease: Secondary | ICD-10-CM | POA: Diagnosis not present

## 2021-02-02 DIAGNOSIS — D689 Coagulation defect, unspecified: Secondary | ICD-10-CM | POA: Diagnosis not present

## 2021-02-04 DIAGNOSIS — D689 Coagulation defect, unspecified: Secondary | ICD-10-CM | POA: Diagnosis not present

## 2021-02-04 DIAGNOSIS — N2581 Secondary hyperparathyroidism of renal origin: Secondary | ICD-10-CM | POA: Diagnosis not present

## 2021-02-04 DIAGNOSIS — Z992 Dependence on renal dialysis: Secondary | ICD-10-CM | POA: Diagnosis not present

## 2021-02-04 DIAGNOSIS — I159 Secondary hypertension, unspecified: Secondary | ICD-10-CM | POA: Diagnosis not present

## 2021-02-04 DIAGNOSIS — N186 End stage renal disease: Secondary | ICD-10-CM | POA: Diagnosis not present

## 2021-02-04 DIAGNOSIS — D631 Anemia in chronic kidney disease: Secondary | ICD-10-CM | POA: Diagnosis not present

## 2021-02-07 DIAGNOSIS — E118 Type 2 diabetes mellitus with unspecified complications: Secondary | ICD-10-CM | POA: Diagnosis not present

## 2021-02-07 DIAGNOSIS — D631 Anemia in chronic kidney disease: Secondary | ICD-10-CM | POA: Diagnosis not present

## 2021-02-07 DIAGNOSIS — Z992 Dependence on renal dialysis: Secondary | ICD-10-CM | POA: Diagnosis not present

## 2021-02-07 DIAGNOSIS — D689 Coagulation defect, unspecified: Secondary | ICD-10-CM | POA: Diagnosis not present

## 2021-02-07 DIAGNOSIS — N186 End stage renal disease: Secondary | ICD-10-CM | POA: Diagnosis not present

## 2021-02-07 DIAGNOSIS — N2581 Secondary hyperparathyroidism of renal origin: Secondary | ICD-10-CM | POA: Diagnosis not present

## 2021-02-07 DIAGNOSIS — I159 Secondary hypertension, unspecified: Secondary | ICD-10-CM | POA: Diagnosis not present

## 2021-02-09 DIAGNOSIS — D689 Coagulation defect, unspecified: Secondary | ICD-10-CM | POA: Diagnosis not present

## 2021-02-09 DIAGNOSIS — E118 Type 2 diabetes mellitus with unspecified complications: Secondary | ICD-10-CM | POA: Diagnosis not present

## 2021-02-09 DIAGNOSIS — N2581 Secondary hyperparathyroidism of renal origin: Secondary | ICD-10-CM | POA: Diagnosis not present

## 2021-02-09 DIAGNOSIS — N186 End stage renal disease: Secondary | ICD-10-CM | POA: Diagnosis not present

## 2021-02-09 DIAGNOSIS — Z992 Dependence on renal dialysis: Secondary | ICD-10-CM | POA: Diagnosis not present

## 2021-02-09 DIAGNOSIS — D631 Anemia in chronic kidney disease: Secondary | ICD-10-CM | POA: Diagnosis not present

## 2021-02-09 DIAGNOSIS — I159 Secondary hypertension, unspecified: Secondary | ICD-10-CM | POA: Diagnosis not present

## 2021-02-11 DIAGNOSIS — I159 Secondary hypertension, unspecified: Secondary | ICD-10-CM | POA: Diagnosis not present

## 2021-02-11 DIAGNOSIS — D689 Coagulation defect, unspecified: Secondary | ICD-10-CM | POA: Diagnosis not present

## 2021-02-11 DIAGNOSIS — Z992 Dependence on renal dialysis: Secondary | ICD-10-CM | POA: Diagnosis not present

## 2021-02-11 DIAGNOSIS — N2581 Secondary hyperparathyroidism of renal origin: Secondary | ICD-10-CM | POA: Diagnosis not present

## 2021-02-11 DIAGNOSIS — N186 End stage renal disease: Secondary | ICD-10-CM | POA: Diagnosis not present

## 2021-02-11 DIAGNOSIS — D631 Anemia in chronic kidney disease: Secondary | ICD-10-CM | POA: Diagnosis not present

## 2021-02-11 DIAGNOSIS — E118 Type 2 diabetes mellitus with unspecified complications: Secondary | ICD-10-CM | POA: Diagnosis not present

## 2021-02-14 DIAGNOSIS — Z992 Dependence on renal dialysis: Secondary | ICD-10-CM | POA: Diagnosis not present

## 2021-02-14 DIAGNOSIS — D689 Coagulation defect, unspecified: Secondary | ICD-10-CM | POA: Diagnosis not present

## 2021-02-14 DIAGNOSIS — D631 Anemia in chronic kidney disease: Secondary | ICD-10-CM | POA: Diagnosis not present

## 2021-02-14 DIAGNOSIS — N186 End stage renal disease: Secondary | ICD-10-CM | POA: Diagnosis not present

## 2021-02-14 DIAGNOSIS — I159 Secondary hypertension, unspecified: Secondary | ICD-10-CM | POA: Diagnosis not present

## 2021-02-14 DIAGNOSIS — N2581 Secondary hyperparathyroidism of renal origin: Secondary | ICD-10-CM | POA: Diagnosis not present

## 2021-02-15 DIAGNOSIS — T82858A Stenosis of vascular prosthetic devices, implants and grafts, initial encounter: Secondary | ICD-10-CM | POA: Diagnosis not present

## 2021-02-15 DIAGNOSIS — I871 Compression of vein: Secondary | ICD-10-CM | POA: Diagnosis not present

## 2021-02-15 DIAGNOSIS — N186 End stage renal disease: Secondary | ICD-10-CM | POA: Diagnosis not present

## 2021-02-15 DIAGNOSIS — Z992 Dependence on renal dialysis: Secondary | ICD-10-CM | POA: Diagnosis not present

## 2021-02-16 DIAGNOSIS — N186 End stage renal disease: Secondary | ICD-10-CM | POA: Diagnosis not present

## 2021-02-16 DIAGNOSIS — Z992 Dependence on renal dialysis: Secondary | ICD-10-CM | POA: Diagnosis not present

## 2021-02-16 DIAGNOSIS — D631 Anemia in chronic kidney disease: Secondary | ICD-10-CM | POA: Diagnosis not present

## 2021-02-16 DIAGNOSIS — N2581 Secondary hyperparathyroidism of renal origin: Secondary | ICD-10-CM | POA: Diagnosis not present

## 2021-02-16 DIAGNOSIS — D689 Coagulation defect, unspecified: Secondary | ICD-10-CM | POA: Diagnosis not present

## 2021-02-16 DIAGNOSIS — I159 Secondary hypertension, unspecified: Secondary | ICD-10-CM | POA: Diagnosis not present

## 2021-02-18 DIAGNOSIS — N186 End stage renal disease: Secondary | ICD-10-CM | POA: Diagnosis not present

## 2021-02-18 DIAGNOSIS — N2581 Secondary hyperparathyroidism of renal origin: Secondary | ICD-10-CM | POA: Diagnosis not present

## 2021-02-18 DIAGNOSIS — Z992 Dependence on renal dialysis: Secondary | ICD-10-CM | POA: Diagnosis not present

## 2021-02-18 DIAGNOSIS — D689 Coagulation defect, unspecified: Secondary | ICD-10-CM | POA: Diagnosis not present

## 2021-02-18 DIAGNOSIS — D631 Anemia in chronic kidney disease: Secondary | ICD-10-CM | POA: Diagnosis not present

## 2021-02-18 DIAGNOSIS — I159 Secondary hypertension, unspecified: Secondary | ICD-10-CM | POA: Diagnosis not present

## 2021-02-21 DIAGNOSIS — D689 Coagulation defect, unspecified: Secondary | ICD-10-CM | POA: Diagnosis not present

## 2021-02-21 DIAGNOSIS — D631 Anemia in chronic kidney disease: Secondary | ICD-10-CM | POA: Diagnosis not present

## 2021-02-21 DIAGNOSIS — I159 Secondary hypertension, unspecified: Secondary | ICD-10-CM | POA: Diagnosis not present

## 2021-02-21 DIAGNOSIS — N186 End stage renal disease: Secondary | ICD-10-CM | POA: Diagnosis not present

## 2021-02-21 DIAGNOSIS — N2581 Secondary hyperparathyroidism of renal origin: Secondary | ICD-10-CM | POA: Diagnosis not present

## 2021-02-21 DIAGNOSIS — Z992 Dependence on renal dialysis: Secondary | ICD-10-CM | POA: Diagnosis not present

## 2021-02-23 DIAGNOSIS — D631 Anemia in chronic kidney disease: Secondary | ICD-10-CM | POA: Diagnosis not present

## 2021-02-23 DIAGNOSIS — Z992 Dependence on renal dialysis: Secondary | ICD-10-CM | POA: Diagnosis not present

## 2021-02-23 DIAGNOSIS — I159 Secondary hypertension, unspecified: Secondary | ICD-10-CM | POA: Diagnosis not present

## 2021-02-23 DIAGNOSIS — N186 End stage renal disease: Secondary | ICD-10-CM | POA: Diagnosis not present

## 2021-02-23 DIAGNOSIS — D689 Coagulation defect, unspecified: Secondary | ICD-10-CM | POA: Diagnosis not present

## 2021-02-23 DIAGNOSIS — N2581 Secondary hyperparathyroidism of renal origin: Secondary | ICD-10-CM | POA: Diagnosis not present

## 2021-02-24 DIAGNOSIS — Z992 Dependence on renal dialysis: Secondary | ICD-10-CM | POA: Diagnosis not present

## 2021-02-24 DIAGNOSIS — N186 End stage renal disease: Secondary | ICD-10-CM | POA: Diagnosis not present

## 2021-02-24 DIAGNOSIS — E1129 Type 2 diabetes mellitus with other diabetic kidney complication: Secondary | ICD-10-CM | POA: Diagnosis not present

## 2021-02-25 DIAGNOSIS — N2581 Secondary hyperparathyroidism of renal origin: Secondary | ICD-10-CM | POA: Diagnosis not present

## 2021-02-25 DIAGNOSIS — Z992 Dependence on renal dialysis: Secondary | ICD-10-CM | POA: Diagnosis not present

## 2021-02-25 DIAGNOSIS — N186 End stage renal disease: Secondary | ICD-10-CM | POA: Diagnosis not present

## 2021-02-25 DIAGNOSIS — D689 Coagulation defect, unspecified: Secondary | ICD-10-CM | POA: Diagnosis not present

## 2021-02-25 DIAGNOSIS — I159 Secondary hypertension, unspecified: Secondary | ICD-10-CM | POA: Diagnosis not present

## 2021-02-28 DIAGNOSIS — N186 End stage renal disease: Secondary | ICD-10-CM | POA: Diagnosis not present

## 2021-02-28 DIAGNOSIS — D689 Coagulation defect, unspecified: Secondary | ICD-10-CM | POA: Diagnosis not present

## 2021-02-28 DIAGNOSIS — D631 Anemia in chronic kidney disease: Secondary | ICD-10-CM | POA: Diagnosis not present

## 2021-02-28 DIAGNOSIS — I159 Secondary hypertension, unspecified: Secondary | ICD-10-CM | POA: Diagnosis not present

## 2021-02-28 DIAGNOSIS — Z992 Dependence on renal dialysis: Secondary | ICD-10-CM | POA: Diagnosis not present

## 2021-02-28 DIAGNOSIS — N2581 Secondary hyperparathyroidism of renal origin: Secondary | ICD-10-CM | POA: Diagnosis not present

## 2021-03-02 DIAGNOSIS — N2581 Secondary hyperparathyroidism of renal origin: Secondary | ICD-10-CM | POA: Diagnosis not present

## 2021-03-02 DIAGNOSIS — N186 End stage renal disease: Secondary | ICD-10-CM | POA: Diagnosis not present

## 2021-03-02 DIAGNOSIS — D689 Coagulation defect, unspecified: Secondary | ICD-10-CM | POA: Diagnosis not present

## 2021-03-02 DIAGNOSIS — D631 Anemia in chronic kidney disease: Secondary | ICD-10-CM | POA: Diagnosis not present

## 2021-03-02 DIAGNOSIS — I159 Secondary hypertension, unspecified: Secondary | ICD-10-CM | POA: Diagnosis not present

## 2021-03-02 DIAGNOSIS — Z992 Dependence on renal dialysis: Secondary | ICD-10-CM | POA: Diagnosis not present

## 2021-03-04 DIAGNOSIS — D689 Coagulation defect, unspecified: Secondary | ICD-10-CM | POA: Diagnosis not present

## 2021-03-04 DIAGNOSIS — N186 End stage renal disease: Secondary | ICD-10-CM | POA: Diagnosis not present

## 2021-03-04 DIAGNOSIS — D631 Anemia in chronic kidney disease: Secondary | ICD-10-CM | POA: Diagnosis not present

## 2021-03-04 DIAGNOSIS — I159 Secondary hypertension, unspecified: Secondary | ICD-10-CM | POA: Diagnosis not present

## 2021-03-04 DIAGNOSIS — Z992 Dependence on renal dialysis: Secondary | ICD-10-CM | POA: Diagnosis not present

## 2021-03-04 DIAGNOSIS — N2581 Secondary hyperparathyroidism of renal origin: Secondary | ICD-10-CM | POA: Diagnosis not present

## 2021-03-07 DIAGNOSIS — N2581 Secondary hyperparathyroidism of renal origin: Secondary | ICD-10-CM | POA: Diagnosis not present

## 2021-03-07 DIAGNOSIS — N186 End stage renal disease: Secondary | ICD-10-CM | POA: Diagnosis not present

## 2021-03-07 DIAGNOSIS — I159 Secondary hypertension, unspecified: Secondary | ICD-10-CM | POA: Diagnosis not present

## 2021-03-07 DIAGNOSIS — D631 Anemia in chronic kidney disease: Secondary | ICD-10-CM | POA: Diagnosis not present

## 2021-03-07 DIAGNOSIS — Z992 Dependence on renal dialysis: Secondary | ICD-10-CM | POA: Diagnosis not present

## 2021-03-07 DIAGNOSIS — D689 Coagulation defect, unspecified: Secondary | ICD-10-CM | POA: Diagnosis not present

## 2021-03-09 DIAGNOSIS — I159 Secondary hypertension, unspecified: Secondary | ICD-10-CM | POA: Diagnosis not present

## 2021-03-09 DIAGNOSIS — Z992 Dependence on renal dialysis: Secondary | ICD-10-CM | POA: Diagnosis not present

## 2021-03-09 DIAGNOSIS — N2581 Secondary hyperparathyroidism of renal origin: Secondary | ICD-10-CM | POA: Diagnosis not present

## 2021-03-09 DIAGNOSIS — D689 Coagulation defect, unspecified: Secondary | ICD-10-CM | POA: Diagnosis not present

## 2021-03-09 DIAGNOSIS — D631 Anemia in chronic kidney disease: Secondary | ICD-10-CM | POA: Diagnosis not present

## 2021-03-09 DIAGNOSIS — N186 End stage renal disease: Secondary | ICD-10-CM | POA: Diagnosis not present

## 2021-03-11 DIAGNOSIS — N2581 Secondary hyperparathyroidism of renal origin: Secondary | ICD-10-CM | POA: Diagnosis not present

## 2021-03-11 DIAGNOSIS — Z992 Dependence on renal dialysis: Secondary | ICD-10-CM | POA: Diagnosis not present

## 2021-03-11 DIAGNOSIS — N186 End stage renal disease: Secondary | ICD-10-CM | POA: Diagnosis not present

## 2021-03-11 DIAGNOSIS — I159 Secondary hypertension, unspecified: Secondary | ICD-10-CM | POA: Diagnosis not present

## 2021-03-11 DIAGNOSIS — D631 Anemia in chronic kidney disease: Secondary | ICD-10-CM | POA: Diagnosis not present

## 2021-03-11 DIAGNOSIS — D689 Coagulation defect, unspecified: Secondary | ICD-10-CM | POA: Diagnosis not present

## 2021-03-14 DIAGNOSIS — I159 Secondary hypertension, unspecified: Secondary | ICD-10-CM | POA: Diagnosis not present

## 2021-03-14 DIAGNOSIS — D631 Anemia in chronic kidney disease: Secondary | ICD-10-CM | POA: Diagnosis not present

## 2021-03-14 DIAGNOSIS — N2581 Secondary hyperparathyroidism of renal origin: Secondary | ICD-10-CM | POA: Diagnosis not present

## 2021-03-14 DIAGNOSIS — N186 End stage renal disease: Secondary | ICD-10-CM | POA: Diagnosis not present

## 2021-03-14 DIAGNOSIS — E118 Type 2 diabetes mellitus with unspecified complications: Secondary | ICD-10-CM | POA: Diagnosis not present

## 2021-03-14 DIAGNOSIS — Z992 Dependence on renal dialysis: Secondary | ICD-10-CM | POA: Diagnosis not present

## 2021-03-14 DIAGNOSIS — D689 Coagulation defect, unspecified: Secondary | ICD-10-CM | POA: Diagnosis not present

## 2021-03-16 DIAGNOSIS — D689 Coagulation defect, unspecified: Secondary | ICD-10-CM | POA: Diagnosis not present

## 2021-03-16 DIAGNOSIS — Z992 Dependence on renal dialysis: Secondary | ICD-10-CM | POA: Diagnosis not present

## 2021-03-16 DIAGNOSIS — I159 Secondary hypertension, unspecified: Secondary | ICD-10-CM | POA: Diagnosis not present

## 2021-03-16 DIAGNOSIS — E118 Type 2 diabetes mellitus with unspecified complications: Secondary | ICD-10-CM | POA: Diagnosis not present

## 2021-03-16 DIAGNOSIS — N186 End stage renal disease: Secondary | ICD-10-CM | POA: Diagnosis not present

## 2021-03-16 DIAGNOSIS — D631 Anemia in chronic kidney disease: Secondary | ICD-10-CM | POA: Diagnosis not present

## 2021-03-16 DIAGNOSIS — N2581 Secondary hyperparathyroidism of renal origin: Secondary | ICD-10-CM | POA: Diagnosis not present

## 2021-03-18 DIAGNOSIS — D689 Coagulation defect, unspecified: Secondary | ICD-10-CM | POA: Diagnosis not present

## 2021-03-18 DIAGNOSIS — N186 End stage renal disease: Secondary | ICD-10-CM | POA: Diagnosis not present

## 2021-03-18 DIAGNOSIS — D631 Anemia in chronic kidney disease: Secondary | ICD-10-CM | POA: Diagnosis not present

## 2021-03-18 DIAGNOSIS — Z992 Dependence on renal dialysis: Secondary | ICD-10-CM | POA: Diagnosis not present

## 2021-03-18 DIAGNOSIS — E118 Type 2 diabetes mellitus with unspecified complications: Secondary | ICD-10-CM | POA: Diagnosis not present

## 2021-03-18 DIAGNOSIS — N2581 Secondary hyperparathyroidism of renal origin: Secondary | ICD-10-CM | POA: Diagnosis not present

## 2021-03-18 DIAGNOSIS — I159 Secondary hypertension, unspecified: Secondary | ICD-10-CM | POA: Diagnosis not present

## 2021-03-21 DIAGNOSIS — N186 End stage renal disease: Secondary | ICD-10-CM | POA: Diagnosis not present

## 2021-03-21 DIAGNOSIS — D509 Iron deficiency anemia, unspecified: Secondary | ICD-10-CM | POA: Diagnosis not present

## 2021-03-21 DIAGNOSIS — D689 Coagulation defect, unspecified: Secondary | ICD-10-CM | POA: Diagnosis not present

## 2021-03-21 DIAGNOSIS — D631 Anemia in chronic kidney disease: Secondary | ICD-10-CM | POA: Diagnosis not present

## 2021-03-21 DIAGNOSIS — Z992 Dependence on renal dialysis: Secondary | ICD-10-CM | POA: Diagnosis not present

## 2021-03-21 DIAGNOSIS — I159 Secondary hypertension, unspecified: Secondary | ICD-10-CM | POA: Diagnosis not present

## 2021-03-21 DIAGNOSIS — N2581 Secondary hyperparathyroidism of renal origin: Secondary | ICD-10-CM | POA: Diagnosis not present

## 2021-03-23 DIAGNOSIS — N186 End stage renal disease: Secondary | ICD-10-CM | POA: Diagnosis not present

## 2021-03-23 DIAGNOSIS — D631 Anemia in chronic kidney disease: Secondary | ICD-10-CM | POA: Diagnosis not present

## 2021-03-23 DIAGNOSIS — D689 Coagulation defect, unspecified: Secondary | ICD-10-CM | POA: Diagnosis not present

## 2021-03-23 DIAGNOSIS — Z992 Dependence on renal dialysis: Secondary | ICD-10-CM | POA: Diagnosis not present

## 2021-03-23 DIAGNOSIS — N2581 Secondary hyperparathyroidism of renal origin: Secondary | ICD-10-CM | POA: Diagnosis not present

## 2021-03-23 DIAGNOSIS — D509 Iron deficiency anemia, unspecified: Secondary | ICD-10-CM | POA: Diagnosis not present

## 2021-03-23 DIAGNOSIS — I159 Secondary hypertension, unspecified: Secondary | ICD-10-CM | POA: Diagnosis not present

## 2021-03-25 DIAGNOSIS — Z992 Dependence on renal dialysis: Secondary | ICD-10-CM | POA: Diagnosis not present

## 2021-03-25 DIAGNOSIS — I159 Secondary hypertension, unspecified: Secondary | ICD-10-CM | POA: Diagnosis not present

## 2021-03-25 DIAGNOSIS — D631 Anemia in chronic kidney disease: Secondary | ICD-10-CM | POA: Diagnosis not present

## 2021-03-25 DIAGNOSIS — D689 Coagulation defect, unspecified: Secondary | ICD-10-CM | POA: Diagnosis not present

## 2021-03-25 DIAGNOSIS — N2581 Secondary hyperparathyroidism of renal origin: Secondary | ICD-10-CM | POA: Diagnosis not present

## 2021-03-25 DIAGNOSIS — N186 End stage renal disease: Secondary | ICD-10-CM | POA: Diagnosis not present

## 2021-03-25 DIAGNOSIS — D509 Iron deficiency anemia, unspecified: Secondary | ICD-10-CM | POA: Diagnosis not present

## 2021-03-26 DIAGNOSIS — E1129 Type 2 diabetes mellitus with other diabetic kidney complication: Secondary | ICD-10-CM | POA: Diagnosis not present

## 2021-03-26 DIAGNOSIS — Z992 Dependence on renal dialysis: Secondary | ICD-10-CM | POA: Diagnosis not present

## 2021-03-26 DIAGNOSIS — N186 End stage renal disease: Secondary | ICD-10-CM | POA: Diagnosis not present

## 2021-03-28 DIAGNOSIS — D689 Coagulation defect, unspecified: Secondary | ICD-10-CM | POA: Diagnosis not present

## 2021-03-28 DIAGNOSIS — I159 Secondary hypertension, unspecified: Secondary | ICD-10-CM | POA: Diagnosis not present

## 2021-03-28 DIAGNOSIS — N186 End stage renal disease: Secondary | ICD-10-CM | POA: Diagnosis not present

## 2021-03-28 DIAGNOSIS — D631 Anemia in chronic kidney disease: Secondary | ICD-10-CM | POA: Diagnosis not present

## 2021-03-28 DIAGNOSIS — Z992 Dependence on renal dialysis: Secondary | ICD-10-CM | POA: Diagnosis not present

## 2021-03-28 DIAGNOSIS — N2581 Secondary hyperparathyroidism of renal origin: Secondary | ICD-10-CM | POA: Diagnosis not present

## 2021-03-30 DIAGNOSIS — D689 Coagulation defect, unspecified: Secondary | ICD-10-CM | POA: Diagnosis not present

## 2021-03-30 DIAGNOSIS — N186 End stage renal disease: Secondary | ICD-10-CM | POA: Diagnosis not present

## 2021-03-30 DIAGNOSIS — N2581 Secondary hyperparathyroidism of renal origin: Secondary | ICD-10-CM | POA: Diagnosis not present

## 2021-03-30 DIAGNOSIS — D631 Anemia in chronic kidney disease: Secondary | ICD-10-CM | POA: Diagnosis not present

## 2021-03-30 DIAGNOSIS — I159 Secondary hypertension, unspecified: Secondary | ICD-10-CM | POA: Diagnosis not present

## 2021-03-30 DIAGNOSIS — Z992 Dependence on renal dialysis: Secondary | ICD-10-CM | POA: Diagnosis not present

## 2021-04-01 DIAGNOSIS — D689 Coagulation defect, unspecified: Secondary | ICD-10-CM | POA: Diagnosis not present

## 2021-04-01 DIAGNOSIS — Z992 Dependence on renal dialysis: Secondary | ICD-10-CM | POA: Diagnosis not present

## 2021-04-01 DIAGNOSIS — N186 End stage renal disease: Secondary | ICD-10-CM | POA: Diagnosis not present

## 2021-04-01 DIAGNOSIS — D631 Anemia in chronic kidney disease: Secondary | ICD-10-CM | POA: Diagnosis not present

## 2021-04-01 DIAGNOSIS — N2581 Secondary hyperparathyroidism of renal origin: Secondary | ICD-10-CM | POA: Diagnosis not present

## 2021-04-01 DIAGNOSIS — I159 Secondary hypertension, unspecified: Secondary | ICD-10-CM | POA: Diagnosis not present

## 2021-04-04 DIAGNOSIS — D689 Coagulation defect, unspecified: Secondary | ICD-10-CM | POA: Diagnosis not present

## 2021-04-04 DIAGNOSIS — Z992 Dependence on renal dialysis: Secondary | ICD-10-CM | POA: Diagnosis not present

## 2021-04-04 DIAGNOSIS — N186 End stage renal disease: Secondary | ICD-10-CM | POA: Diagnosis not present

## 2021-04-04 DIAGNOSIS — I159 Secondary hypertension, unspecified: Secondary | ICD-10-CM | POA: Diagnosis not present

## 2021-04-04 DIAGNOSIS — D631 Anemia in chronic kidney disease: Secondary | ICD-10-CM | POA: Diagnosis not present

## 2021-04-04 DIAGNOSIS — N2581 Secondary hyperparathyroidism of renal origin: Secondary | ICD-10-CM | POA: Diagnosis not present

## 2021-04-06 DIAGNOSIS — Z992 Dependence on renal dialysis: Secondary | ICD-10-CM | POA: Diagnosis not present

## 2021-04-06 DIAGNOSIS — N186 End stage renal disease: Secondary | ICD-10-CM | POA: Diagnosis not present

## 2021-04-06 DIAGNOSIS — D689 Coagulation defect, unspecified: Secondary | ICD-10-CM | POA: Diagnosis not present

## 2021-04-06 DIAGNOSIS — N2581 Secondary hyperparathyroidism of renal origin: Secondary | ICD-10-CM | POA: Diagnosis not present

## 2021-04-06 DIAGNOSIS — D631 Anemia in chronic kidney disease: Secondary | ICD-10-CM | POA: Diagnosis not present

## 2021-04-06 DIAGNOSIS — I159 Secondary hypertension, unspecified: Secondary | ICD-10-CM | POA: Diagnosis not present

## 2021-04-08 DIAGNOSIS — D689 Coagulation defect, unspecified: Secondary | ICD-10-CM | POA: Diagnosis not present

## 2021-04-08 DIAGNOSIS — D631 Anemia in chronic kidney disease: Secondary | ICD-10-CM | POA: Diagnosis not present

## 2021-04-08 DIAGNOSIS — N2581 Secondary hyperparathyroidism of renal origin: Secondary | ICD-10-CM | POA: Diagnosis not present

## 2021-04-08 DIAGNOSIS — Z992 Dependence on renal dialysis: Secondary | ICD-10-CM | POA: Diagnosis not present

## 2021-04-08 DIAGNOSIS — I159 Secondary hypertension, unspecified: Secondary | ICD-10-CM | POA: Diagnosis not present

## 2021-04-08 DIAGNOSIS — N186 End stage renal disease: Secondary | ICD-10-CM | POA: Diagnosis not present

## 2021-04-11 DIAGNOSIS — Z992 Dependence on renal dialysis: Secondary | ICD-10-CM | POA: Diagnosis not present

## 2021-04-11 DIAGNOSIS — I159 Secondary hypertension, unspecified: Secondary | ICD-10-CM | POA: Diagnosis not present

## 2021-04-11 DIAGNOSIS — D689 Coagulation defect, unspecified: Secondary | ICD-10-CM | POA: Diagnosis not present

## 2021-04-11 DIAGNOSIS — N2581 Secondary hyperparathyroidism of renal origin: Secondary | ICD-10-CM | POA: Diagnosis not present

## 2021-04-11 DIAGNOSIS — E118 Type 2 diabetes mellitus with unspecified complications: Secondary | ICD-10-CM | POA: Diagnosis not present

## 2021-04-11 DIAGNOSIS — D631 Anemia in chronic kidney disease: Secondary | ICD-10-CM | POA: Diagnosis not present

## 2021-04-11 DIAGNOSIS — N186 End stage renal disease: Secondary | ICD-10-CM | POA: Diagnosis not present

## 2021-04-12 DIAGNOSIS — I2724 Chronic thromboembolic pulmonary hypertension: Secondary | ICD-10-CM | POA: Diagnosis not present

## 2021-04-12 DIAGNOSIS — I272 Pulmonary hypertension, unspecified: Secondary | ICD-10-CM | POA: Diagnosis not present

## 2021-04-12 DIAGNOSIS — I2510 Atherosclerotic heart disease of native coronary artery without angina pectoris: Secondary | ICD-10-CM | POA: Diagnosis not present

## 2021-04-12 DIAGNOSIS — N186 End stage renal disease: Secondary | ICD-10-CM | POA: Diagnosis not present

## 2021-04-12 DIAGNOSIS — Z992 Dependence on renal dialysis: Secondary | ICD-10-CM | POA: Diagnosis not present

## 2021-04-12 DIAGNOSIS — Z8673 Personal history of transient ischemic attack (TIA), and cerebral infarction without residual deficits: Secondary | ICD-10-CM | POA: Diagnosis not present

## 2021-04-12 DIAGNOSIS — I120 Hypertensive chronic kidney disease with stage 5 chronic kidney disease or end stage renal disease: Secondary | ICD-10-CM | POA: Diagnosis not present

## 2021-04-12 DIAGNOSIS — I132 Hypertensive heart and chronic kidney disease with heart failure and with stage 5 chronic kidney disease, or end stage renal disease: Secondary | ICD-10-CM | POA: Diagnosis not present

## 2021-04-12 DIAGNOSIS — N185 Chronic kidney disease, stage 5: Secondary | ICD-10-CM | POA: Diagnosis not present

## 2021-04-12 DIAGNOSIS — Z940 Kidney transplant status: Secondary | ICD-10-CM | POA: Diagnosis not present

## 2021-04-12 DIAGNOSIS — E1122 Type 2 diabetes mellitus with diabetic chronic kidney disease: Secondary | ICD-10-CM | POA: Diagnosis not present

## 2021-04-12 DIAGNOSIS — Z888 Allergy status to other drugs, medicaments and biological substances status: Secondary | ICD-10-CM | POA: Diagnosis not present

## 2021-04-13 DIAGNOSIS — I159 Secondary hypertension, unspecified: Secondary | ICD-10-CM | POA: Diagnosis not present

## 2021-04-13 DIAGNOSIS — Z992 Dependence on renal dialysis: Secondary | ICD-10-CM | POA: Diagnosis not present

## 2021-04-13 DIAGNOSIS — N2581 Secondary hyperparathyroidism of renal origin: Secondary | ICD-10-CM | POA: Diagnosis not present

## 2021-04-13 DIAGNOSIS — N186 End stage renal disease: Secondary | ICD-10-CM | POA: Diagnosis not present

## 2021-04-13 DIAGNOSIS — E118 Type 2 diabetes mellitus with unspecified complications: Secondary | ICD-10-CM | POA: Diagnosis not present

## 2021-04-13 DIAGNOSIS — D689 Coagulation defect, unspecified: Secondary | ICD-10-CM | POA: Diagnosis not present

## 2021-04-13 DIAGNOSIS — D631 Anemia in chronic kidney disease: Secondary | ICD-10-CM | POA: Diagnosis not present

## 2021-04-15 DIAGNOSIS — N186 End stage renal disease: Secondary | ICD-10-CM | POA: Diagnosis not present

## 2021-04-15 DIAGNOSIS — I159 Secondary hypertension, unspecified: Secondary | ICD-10-CM | POA: Diagnosis not present

## 2021-04-15 DIAGNOSIS — E118 Type 2 diabetes mellitus with unspecified complications: Secondary | ICD-10-CM | POA: Diagnosis not present

## 2021-04-15 DIAGNOSIS — D689 Coagulation defect, unspecified: Secondary | ICD-10-CM | POA: Diagnosis not present

## 2021-04-15 DIAGNOSIS — Z992 Dependence on renal dialysis: Secondary | ICD-10-CM | POA: Diagnosis not present

## 2021-04-15 DIAGNOSIS — N2581 Secondary hyperparathyroidism of renal origin: Secondary | ICD-10-CM | POA: Diagnosis not present

## 2021-04-15 DIAGNOSIS — D631 Anemia in chronic kidney disease: Secondary | ICD-10-CM | POA: Diagnosis not present

## 2021-04-18 DIAGNOSIS — N2581 Secondary hyperparathyroidism of renal origin: Secondary | ICD-10-CM | POA: Diagnosis not present

## 2021-04-18 DIAGNOSIS — D689 Coagulation defect, unspecified: Secondary | ICD-10-CM | POA: Diagnosis not present

## 2021-04-18 DIAGNOSIS — I159 Secondary hypertension, unspecified: Secondary | ICD-10-CM | POA: Diagnosis not present

## 2021-04-18 DIAGNOSIS — Z992 Dependence on renal dialysis: Secondary | ICD-10-CM | POA: Diagnosis not present

## 2021-04-18 DIAGNOSIS — N186 End stage renal disease: Secondary | ICD-10-CM | POA: Diagnosis not present

## 2021-04-20 DIAGNOSIS — Z992 Dependence on renal dialysis: Secondary | ICD-10-CM | POA: Diagnosis not present

## 2021-04-20 DIAGNOSIS — N186 End stage renal disease: Secondary | ICD-10-CM | POA: Diagnosis not present

## 2021-04-20 DIAGNOSIS — N2581 Secondary hyperparathyroidism of renal origin: Secondary | ICD-10-CM | POA: Diagnosis not present

## 2021-04-20 DIAGNOSIS — D689 Coagulation defect, unspecified: Secondary | ICD-10-CM | POA: Diagnosis not present

## 2021-04-20 DIAGNOSIS — I159 Secondary hypertension, unspecified: Secondary | ICD-10-CM | POA: Diagnosis not present

## 2021-04-21 ENCOUNTER — Ambulatory Visit (INDEPENDENT_AMBULATORY_CARE_PROVIDER_SITE_OTHER): Payer: Medicare Other | Admitting: Adult Health

## 2021-04-21 ENCOUNTER — Other Ambulatory Visit: Payer: Self-pay

## 2021-04-21 ENCOUNTER — Encounter: Payer: Self-pay | Admitting: Adult Health

## 2021-04-21 VITALS — BP 100/60 | HR 88 | Temp 97.2°F | Wt 172.0 lb

## 2021-04-21 DIAGNOSIS — Z0001 Encounter for general adult medical examination with abnormal findings: Secondary | ICD-10-CM

## 2021-04-21 DIAGNOSIS — Z0000 Encounter for general adult medical examination without abnormal findings: Secondary | ICD-10-CM

## 2021-04-21 DIAGNOSIS — N2581 Secondary hyperparathyroidism of renal origin: Secondary | ICD-10-CM

## 2021-04-21 DIAGNOSIS — D631 Anemia in chronic kidney disease: Secondary | ICD-10-CM

## 2021-04-21 DIAGNOSIS — Z992 Dependence on renal dialysis: Secondary | ICD-10-CM

## 2021-04-21 DIAGNOSIS — E1169 Type 2 diabetes mellitus with other specified complication: Secondary | ICD-10-CM | POA: Diagnosis not present

## 2021-04-21 DIAGNOSIS — Z794 Long term (current) use of insulin: Secondary | ICD-10-CM

## 2021-04-21 DIAGNOSIS — I10 Essential (primary) hypertension: Secondary | ICD-10-CM

## 2021-04-21 DIAGNOSIS — I480 Paroxysmal atrial fibrillation: Secondary | ICD-10-CM

## 2021-04-21 DIAGNOSIS — N186 End stage renal disease: Secondary | ICD-10-CM

## 2021-04-21 DIAGNOSIS — I770 Arteriovenous fistula, acquired: Secondary | ICD-10-CM | POA: Diagnosis not present

## 2021-04-21 DIAGNOSIS — E1122 Type 2 diabetes mellitus with diabetic chronic kidney disease: Secondary | ICD-10-CM

## 2021-04-21 DIAGNOSIS — R6889 Other general symptoms and signs: Secondary | ICD-10-CM | POA: Diagnosis not present

## 2021-04-21 DIAGNOSIS — I429 Cardiomyopathy, unspecified: Secondary | ICD-10-CM | POA: Diagnosis not present

## 2021-04-21 DIAGNOSIS — E669 Obesity, unspecified: Secondary | ICD-10-CM

## 2021-04-21 DIAGNOSIS — I272 Pulmonary hypertension, unspecified: Secondary | ICD-10-CM

## 2021-04-21 DIAGNOSIS — F325 Major depressive disorder, single episode, in full remission: Secondary | ICD-10-CM

## 2021-04-21 DIAGNOSIS — I5032 Chronic diastolic (congestive) heart failure: Secondary | ICD-10-CM | POA: Diagnosis not present

## 2021-04-21 DIAGNOSIS — M1712 Unilateral primary osteoarthritis, left knee: Secondary | ICD-10-CM

## 2021-04-21 DIAGNOSIS — E785 Hyperlipidemia, unspecified: Secondary | ICD-10-CM

## 2021-04-21 NOTE — Progress Notes (Signed)
MEDICARE VISIT WITH 3 MONTH FOLLOW UP  Assessment:   Jody Nguyen was seen today for medicare wellness and follow-up.  Diagnoses and all orders for this visit:  Annual Medicare Wellness Visit Due annually   Essential hypertension At goal; no problems at dialysis Continue: Monitor blood pressure at home; call if consistently over 130/80 Continue DASH diet.   Reminder to go to the ER if any CP, SOB, nausea, dizziness, severe HA, changes vision/speech, left arm numbness and tingling and jaw pain  Chronic diastolic heart failure (HCC)       Followed by cardiology; edema resolved since dialysis  Insulin dependent diabetes with renal manifestation (HCC) Insulin 70/30 PRN hyperglycemia 180<; has not needed in past 12+ months Reminded eye Exam yearly (has done for 2019, report requested from Dr. Katy Nguyen) and Dental Exam every 6 months. Dietary recommendations reviewed  Physical Activity recommendations reviewed -     Hemoglobin A1c  ESRD on dialysis [facility]) Doing well with dialysis after extensive fistula access issues; MWF; followed by Dr. Joelyn Nguyen [facility] for renal transplant via [facility] -     CMP/GFR  AV fistula ([place]) Followed by nephrology, vascular PRN  Anemia in chronic kidney disease, on chronic dialysis ([place]) Followed by nephrology -     CBC with Differential/Platelet  Hyperlipidemia associated with T2DM ([place]) Continue medications: crestor 20 mg daily, no higher due to kidney LDL goal <70 Continue low cholesterol diet and exercise.  -     Lipid panel -     TSH  Vitamin D deficiency Followed by nephrology; continue supplementation. Defer checks to them -   Obesity - BMI 34 Commended on weight loss progress Long discussion about weight loss, diet, and exercise Recommended diet heavy in fruits and veggies and low in animal meats, cheeses, and dairy products, appropriate calorie intake Discussed appropriate weight for height Restart walking Follow up 3 months  Pulmonary  hypertension (HCC) Monitoring, symptoms improved from previous, stable Pulmonology referral as indicated  Cardiomyopathy, unspecified type ([place]) Followed by cardiology, denies concerning sx, fluids managed by dialysis  Paroxysmal atrial fibrillation ([place]) No longer on anticoagulation; continue follow up with cardiology  Chronic periodontitis Encouraged routine follow up with dental   S/P minimally invasive mitral valve repair Followed by cardiology  Depression in remission ([place]) In remission on medicatoins; Continue zoloft, try stopping buspar, follow up if mood worse Lifestyle discussed: diet/exerise, sleep hygiene, stress management, hydration  Secondary renal hyperparathyroidism ([place]) Monitor; managed by nephrology   Medication management Continued    Over 40 minutes of face to face interview, exam, counseling, chart review and critical decision making was performed Future Appointments  Date Time Provider [place]  12/08/2021 11:00 AM Jody Pinto, MD [facility] None      Subjective:  Jody Nguyen is a 65 y.o. female who presents for Medicare Annual Wellness Visit and 3 month follow up.   In 2016, she underwent an elective AoVRw/Anesthesia complications with consequent multi systems failure then transferred to [facility] for ECMO  & Dialysis. She has been on Dialysis treatments 3 x/weekM/W/F, followed by Dr. Joelyn Nguyen. She is hopeful for a Kidney transplant. Follows with [facility]. Also a. Fib, CHF, PH followed by cardiology. Recently switched to eliquis 5 mg BID and doing well.    Echocardiogram 23/2022 at [facility] showed no significant changes from previous; mild LVH, EF 55-60%, AV sclerosis, s/p mitral valve repair intact, similar to 06/2020, mild TR. Reports she is doing well in this past year, denies CP, dyspnea, palpitations, orthopnea, LE  edema.   she has a diagnosis of depression in remission on medication and currently remains on zoloft 25 mg, buspar 10 mg BID,  reports symptoms are well controlled on current regimen, but unsure if buspar ever helped.    BMI is Body mass index is 34.74 kg/m., she has been working on diet, admits got out of walking routine, has been cutting out meat, focusing on vegetables, beans, nuts. Small portions, not much appetite.  Wt Readings from Last 3 Encounters:  04/21/21 172 lb (78 kg)  12/07/20 169 lb (76.7 kg)  11/09/20 170 lb (77.1 kg)    Her blood pressure has been controlled at home, today their BP is BP: 100/60 She does not workout. She denies chest pain, shortness of breath, dizziness.   She is on cholesterol medication (rosuvastatin 20 mg daily) and denies myalgias. Her cholesterol is at goal. The cholesterol last visit was:   Lab Results  Component Value Date   CHOL 197 12/07/2020   HDL 54 12/07/2020   LDLCALC 121 (H) 12/07/2020   TRIG 114 12/07/2020   CHOLHDL 3.6 12/07/2020    She has been working on diet and exercise for T2 diabetes, insulin PRN, on daily ASA, statin, not on ACE/ARB secondary to ESRD on dialysis, and denies foot ulcerations, hyperglycemia, hypoglycemia , increased appetite, nausea, paresthesia of the feet, polydipsia, polyuria, visual disturbances and vomiting. She takes insulin only as needed for glucose readings above 180. Has not needed in past 3 months. She reports fasting glucose typically 115-120 but not checking as much. Last A1C in the office was:  Lab Results  Component Value Date   HGBA1C 5.7 (H) 12/07/2020   She has ESRD on dialysis, gets on MWF. Follows with Jody Nguyen. Last GFR: Lab Results  Component Value Date   GFRAA 7 (L) 12/07/2020   Patient is on Vitamin D supplement, taking what is administered to on dialysis days by nephrology  Lab Results  Component Value Date   VD25OH 36 10/30/2019      Medication Review: Current Outpatient Medications on File Prior to Visit  Medication Sig Dispense Refill  . acetaminophen (TYLENOL) 500 MG tablet Take 1,000 mg by mouth every  6 (six) hours as needed (for pain.).    Marland Kitchen apixaban (ELIQUIS) 5 MG TABS tablet Take 5 mg by mouth 2 (two) times daily.    Marland Kitchen aspirin EC 81 MG tablet Take 81 mg by mouth daily.    Marland Kitchen azelastine (ASTELIN) 0.1 % nasal spray Use 1 to 2 sprays each nostril 2 to 3 x / day 90 mL 3  . carvedilol (COREG) 25 MG tablet Take 1 tablet 2 x /day for BP & Heart 180 tablet 0  . cinacalcet (SENSIPAR) 60 MG tablet Take 1 tablet (60 mg total) by mouth daily. 90 tablet 1  . hydrALAZINE (APRESOLINE) 50 MG tablet Take 1 tablet (50 mg total) by mouth 3 (three) times daily. FOR BLOOD  PRESSURE AND HEART 270 tablet 3  . latanoprost (XALATAN) 0.005 % ophthalmic solution Place 1 drop into both eyes daily.    Marland Kitchen losartan (COZAAR) 50 MG tablet Take 1 tablet Daily for BP 90 tablet 1  . Multiple Vitamins-Minerals (ZINC PO) Take by mouth.    . ondansetron (ZOFRAN ODT) 4 MG disintegrating tablet Take 1 tablet (4 mg total) by mouth every 8 (eight) hours as needed for nausea or vomiting. 10 tablet 0  . RENVELA 800 MG tablet Take 1,600-4,000 mg by mouth 3 (three) times daily.  4000 mg (5 caps) with meals and 1600 mg (2 caps) with snacks  5  . rosuvastatin (CRESTOR) 20 MG tablet Take 1 tablet Daily for Cholesterol 90 tablet 3  . sertraline (ZOLOFT) 25 MG tablet Take 1 tablet Daily for Mood 90 tablet 3  . sildenafil (REVATIO) 20 MG tablet Take 2 tablets (40 mg) 3 x  /day fr Pulmonary Hypertension 540 tablet 3   No current facility-administered medications on file prior to visit.    Allergies  Allergen Reactions  . Minoxidil Other (See Comments)    Pericardial effusion  . Lipitor [Atorvastatin] Other (See Comments)    MYALGIAS > "pain in legs" Tolerates rosuvastatin  . Morphine And Related Itching  . Ace Inhibitors Other (See Comments)    REACTION: "not sure...think it made me drowsy all the time"    Current Problems (verified) Patient Active Problem List   Diagnosis Date Noted  . Patellofemoral arthritis of left knee  04/21/2021  . A-V fistula ([place]) 01/29/2020  . Secondary renal hyperparathyroidism ([place]) 07/28/2019  . Major depression in remission ([place]) 01/22/2019  . ESRD on dialysis ([place]) 10/09/2015  . Cardiomyopathy ([place]) 09/10/2015  . Atrial fibrillation ([place]) 09/10/2015  . S/P minimally invasive mitral valve repair 07/08/2015  . Chronic periodontitis 06/30/2015  . Chronic diastolic heart failure ([place])   . Pulmonary hypertension ([place]) 06/18/2015  . Obesity (BMI 30.0-34.9) 03/29/2015  . Type 2 diabetes mellitus ([place]) 11/22/2014  . Vitamin D deficiency 03/25/2014  . Anemia in chronic kidney disease   . Essential hypertension 08/17/2011  . Hyperlipidemia associated with type 2 diabetes mellitus ([place]) 08/17/2011    Screening Tests Immunization History  Administered Date(s) Administered  . Hepatitis B, adult 10/25/2015, 11/24/2015, 12/27/2015  . Influenza Split 08/20/2012, 08/15/2017  . Influenza, Quadrivalent, Recombinant, Inj, Pf 08/25/2019  . Influenza-Unspecified 09/22/2020  . Moderna Sars-Covid-2 Vaccination 02/06/2020, 03/05/2020  . PPD Test 03/25/2014, 03/29/2015, 09/03/2015  . Pneumococcal Conjugate-13 03/19/2019  . Pneumococcal Polysaccharide-23 09/18/2016, 09/11/2017  . Pneumococcal-Unspecified 07/28/2008  . Td 01/23/2019  . Tdap 12/28/2008    Prior vaccinations: TD or Tdap: 2020   Influenza: 08/2020  Pneumococcal: 2018 Prevnar13: 2020 Shingles/Zostavax: zostavax, can't remember when, ? 5 years ago  Amorita Booster 10/2020    Preventative care: Last colonoscopy: 2013  Last mammogram: 08/2020 Last pap smear/pelvic exam: 10/2018 neg HPV,  Never abnormal, done DEXA: 2010 normal - not a candidate for bisphophonates - defer  Names of Other Physician/Practitioners you currently use: 1. [facility] here for primary care 2. Dr. Katy Nguyen, eye doctor, last visit 10/2020, report reqeusted, glaucoma, going q48m has upcoming  3. Dr.  AZenia Resides dentist, last visit 2020, encouraged to schedule   Patient Care Team: MUnk Pinto MD as PCP - General (Internal Medicine) CStanford BreedBDenice Bors MD as PCP - Cardiology (Cardiology) SRexene Agent MD as Attending Physician (Nephrology)  SURGICAL HISTORY She  has a past surgical history that includes Cesarean section; TEE without cardioversion (N/A, 06/08/2015); Cardiac catheterization; Cardiac catheterization (N/A, 06/18/2015); Colonoscopy; Multiple extractions with alveoloplasty (N/A, 06/30/2015); Mitral valve repair (Right, 07/08/2015); TEE without cardioversion (N/A, 07/08/2015); Cannulation for cardiopulmonary bypass (N/A, 07/08/2015); AV fistula placement (Left, 10/22/2015); Fistula superficialization (Left, 12/28/2015); Cardiac catheterization (Left, 03/28/2016); Ligation of arteriovenous  fistula (Left, 04/28/2017); Insertion of dialysis catheter (Right, 04/28/2017); AV fistula placement (Right, 05/22/2017); Exchange of a dialysis catheter (N/A, 06/28/2017); A/V Fistulagram (Right, 08/16/2017); and Fistula superficialization (Right, 08/21/2017). FAMILY HISTORY Her family history includes Breast cancer in her sister; Cancer  in her sister; Heart attack in her brother; Heart disease in her brother; Stroke in her mother. SOCIAL HISTORY She  reports that she has never smoked. She has never used smokeless tobacco. She reports that she does not drink alcohol and does not use drugs.  MEDICARE WELLNESS OBJECTIVES: Physical activity: Current Exercise Habits: The patient does not participate in regular exercise at present, Exercise limited by: Other - see comments (dialysis, fatigue) Cardiac risk factors: Cardiac Risk Factors include: advanced age (>31mn, >>57women);dyslipidemia;hypertension;diabetes mellitus;obesity (BMI >30kg/m2);sedentary lifestyle Depression/mood screen:   [facility] 04/21[facility]2021  Decreased Interest 0  Down, Depressed, Hopeless 0  PHQ - 2 Score 0  Altered sleeping -   Tired, decreased energy -  Change in appetite -  Feeling bad or failure about yourself  -  Trouble concentrating -  Moving slowly or fidgety/restless -  Suicidal thoughts -  PHQ-9 Score -  Difficult doing work/chores -  Some recent data might be hidden    ADLs:  In your present state of health, do you have any difficulty performing the following activities: 04/21/2021  Hearing? N  Vision? N  Difficulty concentrating or making decisions? N  Walking or climbing stairs? N  Dressing or bathing? N  Doing errands, shopping? N  Some recent data might be hidden     Cognitive Testing  Alert? Yes  Normal Appearance?Yes  Oriented to person? Yes  Place? Yes   Time? Yes  Recall of three objects?  Yes  Can perform simple calculations? Yes  Displays appropriate judgment?Yes  Can read the correct time from a watch face?Yes  EOL planning: Does Patient Have a Medical Advance Directive?: No Type of Advance Directive: [place] Does patient want to make changes to medical advance directive?: No - Patient declined [place] of [place]?: No - copy requested Would patient like information on creating a medical advance directive?: No - Patient declined   Review of Systems  Constitutional: Negative for malaise/fatigue and weight loss.  HENT: Negative for hearing loss and tinnitus.   Eyes: Negative for blurred vision and double vision.  Respiratory: Negative for cough, shortness of breath and wheezing.   Cardiovascular: Negative for chest pain, palpitations, orthopnea, claudication and leg swelling.  Gastrointestinal: Negative for abdominal pain, blood in stool, constipation, diarrhea, heartburn, melena, nausea and vomiting.  Genitourinary: Negative.   Musculoskeletal: Negative for falls, joint pain (L knee, improved) and myalgias.  Skin: Negative for rash.  Neurological: Negative for dizziness, tingling, sensory change, weakness and  headaches.  Endo/Heme/Allergies: Negative for polydipsia.  Psychiatric/Behavioral: Negative.  Negative for depression, memory loss and substance abuse. The patient is not nervous/anxious and does not have insomnia.   All other systems reviewed and are negative.    Objective:     Today's Vitals   04/21/21 1513  BP: 100/60  Pulse: 88  Temp: (!) 97.2 F (36.2 C)  SpO2: 97%  Weight: 172 lb (78 kg)   Body mass index is 34.74 kg/m.  General appearance: alert, no distress, WD/WN, female HEENT: normocephalic, sclerae anicteric, TMs pearly, nares patent, no discharge or erythema, pharynx normal Oral cavity: MMM, no lesions Neck: supple, no lymphadenopathy, no thyromegaly, no masses Heart: RRR, normal S1, S2, no murmurs. AV fistula to RUE with scarring.  Lungs: CTA bilaterally, no wheezes, rhonchi, or rales Abdomen: +bs, soft, non tender, non distended, no masses, no hepatomegaly, no splenomegaly Musculoskeletal: nontender, no swelling, no obvious deformity Extremities: no edema, no  cyanosis, no clubbing Pulses: 2+ symmetric, lower extremities, L upper extremity, normal cap refill. R AV fistula with thrill  Neurological: alert, oriented x 3, CN2-12 intact, strength normal upper extremities and lower extremities, sensation normal throughout, DTRs 2+ throughout, no cerebellar signs, gait normal Psychiatric: normal affect, behavior normal, pleasant    Medicare Attestation I have personally reviewed: The patient's medical and social history Their use of alcohol, tobacco or illicit drugs Their current medications and supplements The patient's functional ability including ADLs,fall risks, home safety risks, cognitive, and hearing and visual impairment Diet and physical activities Evidence for depression or mood disorders  The patient's weight, height, BMI, and visual acuity have been recorded in the chart.  I have made referrals, counseling, and provided education to the patient based on  review of the above and I have provided the patient with a written personalized care plan for preventive services.     Izora Ribas, NP 4:11 PM [facility]

## 2021-04-21 NOTE — Patient Instructions (Addendum)
Ms. Byfield , Thank you for taking time to come for your Medicare Wellness Visit. I appreciate your ongoing commitment to your health goals. Please review the following plan we discussed and let me know if I can assist you in the future.   These are the goals we discussed: Goals    . Blood Pressure < 140/90    . Exercise 150 min/wk Moderate Activity     Add weights or resistance band program 2-3 days a week     . Weight (lb) < 170 lb (77.1 kg)       This is a list of the screening recommended for you and due dates:  Health Maintenance  Topic Date Due  . Zoster (Shingles) Vaccine (1 of 2) Never done  . COVID-19 Vaccine (3 - Booster for Moderna series) 08/05/2020  . Eye exam for diabetics  08/25/2020  . DEXA scan (bone density measurement)  04/21/2022*  . Hemoglobin A1C  06/06/2021  . Flu Shot  06/27/2021  . Mammogram  09/17/2021  . Colon Cancer Screening  01/18/2022  . Complete foot exam   04/21/2022  . Pneumonia vaccines (2 of 2 - PPSV23) 09/11/2022  . Tetanus Vaccine  01/23/2029  . Hepatitis C Screening: USPSTF Recommendation to screen - Ages 16-79 yo.  Completed  . HPV Vaccine  Aged Out  *Topic was postponed. The date shown is not the original due date.     Ask insurance and pharmacy about shingrix - it is a 2 part shot that we will not be getting in the office.   Suggest getting AFTER covid vaccines, have to wait at least a month This shot can make you feel bad due to such good immune response it can trigger some inflammation so take tylenol or aleve day of or day after and plan on resting.   Can go to [email or web address] for more information  Shingrix Vaccination  Two vaccines are licensed and recommended to prevent shingles in the [place] Zoster vaccine live (ZVL, Zostavax) has been in use since 2006. Recombinant zoster vaccine (RZV, Shingrix), has been in use since 2017 and is recommended by [employer] as the preferred shingles  vaccine.  What Everyone Should Know about Shingles Vaccine (Shingrix) One of the Recommended Vaccines by Disease Shingles vaccination is the only way to protect against shingles and postherpetic neuralgia (PHN), the most common complication from shingles. CDC recommends that healthy adults 50 years and older get two doses of the shingles vaccine called Shingrix (recombinant zoster vaccine), separated by 2 to 6 months, to prevent shingles and the complications from the disease. Your doctor or pharmacist can give you Shingrix as a shot in your upper arm. Shingrix provides strong protection against shingles and PHN. Two doses of Shingrix is more than 90% effective at preventing shingles and PHN. Protection stays above 85% for at least the first four years after you get vaccinated. Shingrix is the preferred vaccine, over Zostavax (zoster vaccine live), a shingles vaccine in use since 2006. Zostavax may still be used to prevent shingles in healthy adults 60 years and older. For example, you could use Zostavax if a person is allergic to Shingrix, prefers Zostavax, or requests immediate vaccination and Shingrix is unavailable. Who Should Get Shingrix? Healthy adults 50 years and older should get two doses of Shingrix, separated by 2 to 6 months. You should get Shingrix even if in the past you . had shingles  . received Zostavax  . are not sure if you had chickenpox There  is no maximum age for getting Shingrix. If you had shingles in the past, you can get Shingrix to help prevent future occurrences of the disease. There is no specific length of time that you need to wait after having shingles before you can receive Shingrix, but generally you should make sure the shingles rash has gone away before getting vaccinated. You can get Shingrix whether or not you remember having had chickenpox in the past. Studies show that more than 99% of Americans 40 years and older have had chickenpox, even if they don't remember  having the disease. Chickenpox and shingles are related because they are caused by the same virus (varicella zoster virus). After a person recovers from chickenpox, the virus stays dormant (inactive) in the body. It can reactivate years later and cause shingles. If you had Zostavax in the recent past, you should wait at least eight weeks before getting Shingrix. Talk to your healthcare provider to determine the best time to get Shingrix. Shingrix is available in [facility] and pharmacies. To find doctor's offices or pharmacies near you that offer the vaccine, visit [facility] Vaccine FinderExternal. If you have questions about Shingrix, talk with your healthcare provider. Vaccine for Those 59 Years and Older  Shingrix reduces the risk of shingles and PHN by more than 90% in people 75 and older. CDC recommends the vaccine for healthy adults 57 and older.  Who Should Not Get Shingrix? You should not get Shingrix if you: . have ever had a severe allergic reaction to any component of the vaccine or after a dose of Shingrix  . tested negative for immunity to varicella zoster virus. If you test negative, you should get chickenpox vaccine.  . currently have shingles  . currently are pregnant or breastfeeding. Women who are pregnant or breastfeeding should wait to get Shingrix.  Marland Kitchen receive specific antiviral drugs (acyclovir, famciclovir, or valacyclovir) 24 hours before vaccination (avoid use of these antiviral drugs for 14 days after vaccination)- zoster vaccine live only If you have a minor acute (starts suddenly) illness, such as a cold, you may get Shingrix. But if you have a moderate or severe acute illness, you should usually wait until you recover before getting the vaccine. This includes anyone with a temperature of 101.58F or higher. The side effects of the Shingrix are temporary, and usually last 2 to 3 days. While you may experience pain for a few days after getting Shingrix, the pain will be  less severe than having shingles and the complications from the disease. How Well Does Shingrix Work? Two doses of Shingrix provides strong protection against shingles and postherpetic neuralgia (PHN), the most common complication of shingles. . In adults 61 to 66 years old who got two doses, Shingrix was 97% effective in preventing shingles; among adults 70 years and older, Shingrix was 91% effective.  . In adults 78 to 66 years old who got two doses, Shingrix was 91% effective in preventing PHN; among adults 70 years and older, Shingrix was 89% effective. Shingrix protection remained high (more than 85%) in people 70 years and older throughout the four years following vaccination. Since your risk of shingles and PHN increases as you get older, it is important to have strong protection against shingles in your older years. Top of Page  What Are the Possible Side Effects of Shingrix? Studies show that Shingrix is safe. The vaccine helps your body create a strong defense against shingles. As a result, you are likely to have temporary side  effects from getting the shots. The side effects may affect your ability to do normal daily activities for 2 to 3 days. Most people got a sore arm with mild or moderate pain after getting Shingrix, and some also had redness and swelling where they got the shot. Some people felt tired, had muscle pain, a headache, shivering, fever, stomach pain, or nausea. About 1 out of 6 people who got Shingrix experienced side effects that prevented them from doing regular activities. Symptoms went away on their own in about 2 to 3 days. Side effects were more common in younger people. You might have a reaction to the first or second dose of Shingrix, or both doses. If you experience side effects, you may choose to take over-the-counter pain medicine such as ibuprofen or acetaminophen. If you experience side effects from Shingrix, you should report them to the Vaccine Adverse Event  Reporting System (VAERS). Your doctor might file this report, or you can do it yourself through the VAERS websiteExternal, or by calling [phone]. If you have any questions about side effects from Shingrix, talk with your doctor. The shingles vaccine does not contain thimerosal (a preservative containing mercury). Top of Page  When Should I See a Doctor Because of the Side Effects I Experience From Shingrix? In clinical trials, Shingrix was not associated with serious adverse events. In fact, serious side effects from vaccines are extremely rare. For example, for every 1 million doses of a vaccine given, only one or two people may have a severe allergic reaction. Signs of an allergic reaction happen within minutes or hours after vaccination and include hives, swelling of the face and throat, difficulty breathing, a fast heartbeat, dizziness, or weakness. If you experience these or any other life-threatening symptoms, see a doctor right away. Shingrix causes a strong response in your immune system, so it may produce short-term side effects more intense than you are used to from other vaccines. These side effects can be uncomfortable, but they are expected and usually go away on their own in 2 or 3 days. Top of Page  How Can I Pay For Shingrix? There are several ways shingles vaccine may be paid for: Medicare . Medicare Part D plans cover the shingles vaccine, but there may be a cost to you depending on your plan. There may be a copay for the vaccine, or you may need to pay in full then get reimbursed for a certain amount.  . Medicare Part B does not cover the shingles vaccine. Medicaid . Medicaid may or may not cover the vaccine. Contact your insurer to find out. Private health insurance . Many private health insurance plans will cover the vaccine, but there may be a cost to you depending on your plan. Contact your insurer to find out. Vaccine assistance programs . Some pharmaceutical  companies provide vaccines to eligible adults who cannot afford them. You may want to check with the vaccine manufacturer, [employer], about Shingrix. If you do not currently have health insurance, learn more about affordable health coverage optionsExternal. To find doctor's offices or pharmacies near you that offer the vaccine, visit [facility] Vaccine FinderExternal.

## 2021-04-22 DIAGNOSIS — D689 Coagulation defect, unspecified: Secondary | ICD-10-CM | POA: Diagnosis not present

## 2021-04-22 DIAGNOSIS — N186 End stage renal disease: Secondary | ICD-10-CM | POA: Diagnosis not present

## 2021-04-22 DIAGNOSIS — Z992 Dependence on renal dialysis: Secondary | ICD-10-CM | POA: Diagnosis not present

## 2021-04-22 DIAGNOSIS — N2581 Secondary hyperparathyroidism of renal origin: Secondary | ICD-10-CM | POA: Diagnosis not present

## 2021-04-22 DIAGNOSIS — I159 Secondary hypertension, unspecified: Secondary | ICD-10-CM | POA: Diagnosis not present

## 2021-04-22 LAB — COMPLETE METABOLIC PANEL WITH GFR
AG Ratio: 1.2 (calc) (ref 1.0–2.5)
ALT: 6 U/L (ref 6–29)
AST: 8 U/L — ABNORMAL LOW (ref 10–35)
Albumin: 4.3 g/dL (ref 3.6–5.1)
Alkaline phosphatase (APISO): 77 U/L (ref 37–153)
BUN/Creatinine Ratio: 6 (calc) (ref 6–22)
BUN: 45 mg/dL — ABNORMAL HIGH (ref 7–25)
CO2: 34 mmol/L — ABNORMAL HIGH (ref 20–32)
Calcium: 9.7 mg/dL (ref 8.6–10.4)
Chloride: 91 mmol/L — ABNORMAL LOW (ref 98–110)
Creat: 7.75 mg/dL — ABNORMAL HIGH (ref 0.50–0.99)
GFR, Est African American: 6 mL/min/{1.73_m2} — ABNORMAL LOW (ref >=60)
GFR, Est Non African American: 5 mL/min/{1.73_m2} — ABNORMAL LOW (ref >=60)
Globulin: 3.7 g/dL (calc) (ref 1.9–3.7)
Glucose, Bld: 105 mg/dL — ABNORMAL HIGH (ref 65–99)
Potassium: 4.7 mmol/L (ref 3.5–5.3)
Sodium: 138 mmol/L (ref 135–146)
Total Bilirubin: 0.4 mg/dL (ref 0.2–1.2)
Total Protein: 8 g/dL (ref 6.1–8.1)

## 2021-04-22 LAB — LIPID PANEL
Cholesterol: 243 mg/dL — ABNORMAL HIGH (ref <200)
HDL: 47 mg/dL — ABNORMAL LOW (ref >=50)
LDL Cholesterol (Calc): 162 mg/dL (calc) — ABNORMAL HIGH
Non-HDL Cholesterol (Calc): 196 mg/dL (calc) — ABNORMAL HIGH (ref <130)
Total CHOL/HDL Ratio: 5.2 (calc) — ABNORMAL HIGH (ref <5.0)
Triglycerides: 181 mg/dL — ABNORMAL HIGH (ref <150)

## 2021-04-22 LAB — CBC WITH DIFFERENTIAL/PLATELET
Absolute Monocytes: 675 cells/uL (ref 200–950)
Basophils Absolute: 60 cells/uL (ref 0–200)
Basophils Relative: 1.4 %
Eosinophils Absolute: 198 cells/uL (ref 15–500)
Eosinophils Relative: 4.6 %
HCT: 34.4 % — ABNORMAL LOW (ref 35.0–45.0)
Hemoglobin: 11 g/dL — ABNORMAL LOW (ref 11.7–15.5)
Lymphs Abs: 1191 cells/uL (ref 850–3900)
MCH: 27.8 pg (ref 27.0–33.0)
MCHC: 32 g/dL (ref 32.0–36.0)
MCV: 86.9 fL (ref 80.0–100.0)
MPV: 11.6 fL (ref 7.5–12.5)
Monocytes Relative: 15.7 %
Neutro Abs: 2176 cells/uL (ref 1500–7800)
Neutrophils Relative %: 50.6 %
Platelets: 208 10*3/uL (ref 140–400)
RBC: 3.96 10*6/uL (ref 3.80–5.10)
RDW: 15.6 % — ABNORMAL HIGH (ref 11.0–15.0)
Total Lymphocyte: 27.7 %
WBC: 4.3 10*3/uL (ref 3.8–10.8)

## 2021-04-22 LAB — HEMOGLOBIN A1C
Hgb A1c MFr Bld: 5 % of total Hgb (ref <5.7)
Mean Plasma Glucose: 97 mg/dL
eAG (mmol/L): 5.4 mmol/L

## 2021-04-22 LAB — MAGNESIUM: Magnesium: 2.5 mg/dL (ref 1.5–2.5)

## 2021-04-22 LAB — TSH: TSH: 2.74 mIU/L (ref 0.40–4.50)

## 2021-04-25 DIAGNOSIS — Z992 Dependence on renal dialysis: Secondary | ICD-10-CM | POA: Diagnosis not present

## 2021-04-25 DIAGNOSIS — N186 End stage renal disease: Secondary | ICD-10-CM | POA: Diagnosis not present

## 2021-04-25 DIAGNOSIS — I159 Secondary hypertension, unspecified: Secondary | ICD-10-CM | POA: Diagnosis not present

## 2021-04-25 DIAGNOSIS — N2581 Secondary hyperparathyroidism of renal origin: Secondary | ICD-10-CM | POA: Diagnosis not present

## 2021-04-26 DIAGNOSIS — Z992 Dependence on renal dialysis: Secondary | ICD-10-CM | POA: Diagnosis not present

## 2021-04-26 DIAGNOSIS — N186 End stage renal disease: Secondary | ICD-10-CM | POA: Diagnosis not present

## 2021-04-26 DIAGNOSIS — E1129 Type 2 diabetes mellitus with other diabetic kidney complication: Secondary | ICD-10-CM | POA: Diagnosis not present

## 2021-04-27 DIAGNOSIS — D689 Coagulation defect, unspecified: Secondary | ICD-10-CM | POA: Diagnosis not present

## 2021-04-27 DIAGNOSIS — D631 Anemia in chronic kidney disease: Secondary | ICD-10-CM | POA: Diagnosis not present

## 2021-04-27 DIAGNOSIS — N2581 Secondary hyperparathyroidism of renal origin: Secondary | ICD-10-CM | POA: Diagnosis not present

## 2021-04-27 DIAGNOSIS — N186 End stage renal disease: Secondary | ICD-10-CM | POA: Diagnosis not present

## 2021-04-27 DIAGNOSIS — Z992 Dependence on renal dialysis: Secondary | ICD-10-CM | POA: Diagnosis not present

## 2021-04-29 DIAGNOSIS — D689 Coagulation defect, unspecified: Secondary | ICD-10-CM | POA: Diagnosis not present

## 2021-04-29 DIAGNOSIS — Z992 Dependence on renal dialysis: Secondary | ICD-10-CM | POA: Diagnosis not present

## 2021-04-29 DIAGNOSIS — N2581 Secondary hyperparathyroidism of renal origin: Secondary | ICD-10-CM | POA: Diagnosis not present

## 2021-04-29 DIAGNOSIS — D631 Anemia in chronic kidney disease: Secondary | ICD-10-CM | POA: Diagnosis not present

## 2021-04-29 DIAGNOSIS — N186 End stage renal disease: Secondary | ICD-10-CM | POA: Diagnosis not present

## 2021-05-02 DIAGNOSIS — I159 Secondary hypertension, unspecified: Secondary | ICD-10-CM | POA: Diagnosis not present

## 2021-05-02 DIAGNOSIS — N2581 Secondary hyperparathyroidism of renal origin: Secondary | ICD-10-CM | POA: Diagnosis not present

## 2021-05-02 DIAGNOSIS — Z992 Dependence on renal dialysis: Secondary | ICD-10-CM | POA: Diagnosis not present

## 2021-05-02 DIAGNOSIS — D689 Coagulation defect, unspecified: Secondary | ICD-10-CM | POA: Diagnosis not present

## 2021-05-02 DIAGNOSIS — N186 End stage renal disease: Secondary | ICD-10-CM | POA: Diagnosis not present

## 2021-05-04 DIAGNOSIS — Z992 Dependence on renal dialysis: Secondary | ICD-10-CM | POA: Diagnosis not present

## 2021-05-04 DIAGNOSIS — I159 Secondary hypertension, unspecified: Secondary | ICD-10-CM | POA: Diagnosis not present

## 2021-05-04 DIAGNOSIS — N2581 Secondary hyperparathyroidism of renal origin: Secondary | ICD-10-CM | POA: Diagnosis not present

## 2021-05-04 DIAGNOSIS — N186 End stage renal disease: Secondary | ICD-10-CM | POA: Diagnosis not present

## 2021-05-04 DIAGNOSIS — D689 Coagulation defect, unspecified: Secondary | ICD-10-CM | POA: Diagnosis not present

## 2021-05-06 DIAGNOSIS — N186 End stage renal disease: Secondary | ICD-10-CM | POA: Diagnosis not present

## 2021-05-06 DIAGNOSIS — I159 Secondary hypertension, unspecified: Secondary | ICD-10-CM | POA: Diagnosis not present

## 2021-05-06 DIAGNOSIS — N2581 Secondary hyperparathyroidism of renal origin: Secondary | ICD-10-CM | POA: Diagnosis not present

## 2021-05-06 DIAGNOSIS — Z992 Dependence on renal dialysis: Secondary | ICD-10-CM | POA: Diagnosis not present

## 2021-05-06 DIAGNOSIS — D689 Coagulation defect, unspecified: Secondary | ICD-10-CM | POA: Diagnosis not present

## 2021-05-09 DIAGNOSIS — E118 Type 2 diabetes mellitus with unspecified complications: Secondary | ICD-10-CM | POA: Diagnosis not present

## 2021-05-09 DIAGNOSIS — N2581 Secondary hyperparathyroidism of renal origin: Secondary | ICD-10-CM | POA: Diagnosis not present

## 2021-05-09 DIAGNOSIS — D631 Anemia in chronic kidney disease: Secondary | ICD-10-CM | POA: Diagnosis not present

## 2021-05-09 DIAGNOSIS — N186 End stage renal disease: Secondary | ICD-10-CM | POA: Diagnosis not present

## 2021-05-09 DIAGNOSIS — D689 Coagulation defect, unspecified: Secondary | ICD-10-CM | POA: Diagnosis not present

## 2021-05-09 DIAGNOSIS — Z992 Dependence on renal dialysis: Secondary | ICD-10-CM | POA: Diagnosis not present

## 2021-05-09 DIAGNOSIS — I159 Secondary hypertension, unspecified: Secondary | ICD-10-CM | POA: Diagnosis not present

## 2021-05-11 DIAGNOSIS — E118 Type 2 diabetes mellitus with unspecified complications: Secondary | ICD-10-CM | POA: Diagnosis not present

## 2021-05-11 DIAGNOSIS — D689 Coagulation defect, unspecified: Secondary | ICD-10-CM | POA: Diagnosis not present

## 2021-05-11 DIAGNOSIS — N186 End stage renal disease: Secondary | ICD-10-CM | POA: Diagnosis not present

## 2021-05-11 DIAGNOSIS — Z992 Dependence on renal dialysis: Secondary | ICD-10-CM | POA: Diagnosis not present

## 2021-05-11 DIAGNOSIS — I159 Secondary hypertension, unspecified: Secondary | ICD-10-CM | POA: Diagnosis not present

## 2021-05-11 DIAGNOSIS — N2581 Secondary hyperparathyroidism of renal origin: Secondary | ICD-10-CM | POA: Diagnosis not present

## 2021-05-11 DIAGNOSIS — D631 Anemia in chronic kidney disease: Secondary | ICD-10-CM | POA: Diagnosis not present

## 2021-05-13 DIAGNOSIS — N2581 Secondary hyperparathyroidism of renal origin: Secondary | ICD-10-CM | POA: Diagnosis not present

## 2021-05-13 DIAGNOSIS — D689 Coagulation defect, unspecified: Secondary | ICD-10-CM | POA: Diagnosis not present

## 2021-05-13 DIAGNOSIS — Z992 Dependence on renal dialysis: Secondary | ICD-10-CM | POA: Diagnosis not present

## 2021-05-13 DIAGNOSIS — E118 Type 2 diabetes mellitus with unspecified complications: Secondary | ICD-10-CM | POA: Diagnosis not present

## 2021-05-13 DIAGNOSIS — I159 Secondary hypertension, unspecified: Secondary | ICD-10-CM | POA: Diagnosis not present

## 2021-05-13 DIAGNOSIS — D631 Anemia in chronic kidney disease: Secondary | ICD-10-CM | POA: Diagnosis not present

## 2021-05-13 DIAGNOSIS — N186 End stage renal disease: Secondary | ICD-10-CM | POA: Diagnosis not present

## 2021-05-16 DIAGNOSIS — D689 Coagulation defect, unspecified: Secondary | ICD-10-CM | POA: Diagnosis not present

## 2021-05-16 DIAGNOSIS — Z992 Dependence on renal dialysis: Secondary | ICD-10-CM | POA: Diagnosis not present

## 2021-05-16 DIAGNOSIS — I159 Secondary hypertension, unspecified: Secondary | ICD-10-CM | POA: Diagnosis not present

## 2021-05-16 DIAGNOSIS — N2581 Secondary hyperparathyroidism of renal origin: Secondary | ICD-10-CM | POA: Diagnosis not present

## 2021-05-16 DIAGNOSIS — N186 End stage renal disease: Secondary | ICD-10-CM | POA: Diagnosis not present

## 2021-05-18 DIAGNOSIS — N2581 Secondary hyperparathyroidism of renal origin: Secondary | ICD-10-CM | POA: Diagnosis not present

## 2021-05-18 DIAGNOSIS — D689 Coagulation defect, unspecified: Secondary | ICD-10-CM | POA: Diagnosis not present

## 2021-05-18 DIAGNOSIS — I159 Secondary hypertension, unspecified: Secondary | ICD-10-CM | POA: Diagnosis not present

## 2021-05-18 DIAGNOSIS — N186 End stage renal disease: Secondary | ICD-10-CM | POA: Diagnosis not present

## 2021-05-18 DIAGNOSIS — Z992 Dependence on renal dialysis: Secondary | ICD-10-CM | POA: Diagnosis not present

## 2021-05-20 DIAGNOSIS — Z992 Dependence on renal dialysis: Secondary | ICD-10-CM | POA: Diagnosis not present

## 2021-05-20 DIAGNOSIS — I159 Secondary hypertension, unspecified: Secondary | ICD-10-CM | POA: Diagnosis not present

## 2021-05-20 DIAGNOSIS — D689 Coagulation defect, unspecified: Secondary | ICD-10-CM | POA: Diagnosis not present

## 2021-05-20 DIAGNOSIS — N2581 Secondary hyperparathyroidism of renal origin: Secondary | ICD-10-CM | POA: Diagnosis not present

## 2021-05-20 DIAGNOSIS — N186 End stage renal disease: Secondary | ICD-10-CM | POA: Diagnosis not present

## 2021-05-23 DIAGNOSIS — Z992 Dependence on renal dialysis: Secondary | ICD-10-CM | POA: Diagnosis not present

## 2021-05-23 DIAGNOSIS — N2581 Secondary hyperparathyroidism of renal origin: Secondary | ICD-10-CM | POA: Diagnosis not present

## 2021-05-23 DIAGNOSIS — I159 Secondary hypertension, unspecified: Secondary | ICD-10-CM | POA: Diagnosis not present

## 2021-05-23 DIAGNOSIS — D689 Coagulation defect, unspecified: Secondary | ICD-10-CM | POA: Diagnosis not present

## 2021-05-23 DIAGNOSIS — N186 End stage renal disease: Secondary | ICD-10-CM | POA: Diagnosis not present

## 2021-05-25 DIAGNOSIS — Z992 Dependence on renal dialysis: Secondary | ICD-10-CM | POA: Diagnosis not present

## 2021-05-25 DIAGNOSIS — I159 Secondary hypertension, unspecified: Secondary | ICD-10-CM | POA: Diagnosis not present

## 2021-05-25 DIAGNOSIS — N186 End stage renal disease: Secondary | ICD-10-CM | POA: Diagnosis not present

## 2021-05-25 DIAGNOSIS — N2581 Secondary hyperparathyroidism of renal origin: Secondary | ICD-10-CM | POA: Diagnosis not present

## 2021-05-25 DIAGNOSIS — D689 Coagulation defect, unspecified: Secondary | ICD-10-CM | POA: Diagnosis not present

## 2021-05-26 DIAGNOSIS — N186 End stage renal disease: Secondary | ICD-10-CM | POA: Diagnosis not present

## 2021-05-26 DIAGNOSIS — Z992 Dependence on renal dialysis: Secondary | ICD-10-CM | POA: Diagnosis not present

## 2021-05-26 DIAGNOSIS — E1129 Type 2 diabetes mellitus with other diabetic kidney complication: Secondary | ICD-10-CM | POA: Diagnosis not present

## 2021-05-27 DIAGNOSIS — N2581 Secondary hyperparathyroidism of renal origin: Secondary | ICD-10-CM | POA: Diagnosis not present

## 2021-05-27 DIAGNOSIS — N186 End stage renal disease: Secondary | ICD-10-CM | POA: Diagnosis not present

## 2021-05-27 DIAGNOSIS — Z992 Dependence on renal dialysis: Secondary | ICD-10-CM | POA: Diagnosis not present

## 2021-05-27 DIAGNOSIS — D631 Anemia in chronic kidney disease: Secondary | ICD-10-CM | POA: Diagnosis not present

## 2021-05-27 DIAGNOSIS — I159 Secondary hypertension, unspecified: Secondary | ICD-10-CM | POA: Diagnosis not present

## 2021-05-27 DIAGNOSIS — E118 Type 2 diabetes mellitus with unspecified complications: Secondary | ICD-10-CM | POA: Diagnosis not present

## 2021-05-27 DIAGNOSIS — D689 Coagulation defect, unspecified: Secondary | ICD-10-CM | POA: Diagnosis not present

## 2021-05-30 DIAGNOSIS — N186 End stage renal disease: Secondary | ICD-10-CM | POA: Diagnosis not present

## 2021-05-30 DIAGNOSIS — D631 Anemia in chronic kidney disease: Secondary | ICD-10-CM | POA: Diagnosis not present

## 2021-05-30 DIAGNOSIS — I159 Secondary hypertension, unspecified: Secondary | ICD-10-CM | POA: Diagnosis not present

## 2021-05-30 DIAGNOSIS — N2581 Secondary hyperparathyroidism of renal origin: Secondary | ICD-10-CM | POA: Diagnosis not present

## 2021-05-30 DIAGNOSIS — D689 Coagulation defect, unspecified: Secondary | ICD-10-CM | POA: Diagnosis not present

## 2021-05-30 DIAGNOSIS — Z992 Dependence on renal dialysis: Secondary | ICD-10-CM | POA: Diagnosis not present

## 2021-05-30 DIAGNOSIS — E118 Type 2 diabetes mellitus with unspecified complications: Secondary | ICD-10-CM | POA: Diagnosis not present

## 2021-06-01 DIAGNOSIS — N186 End stage renal disease: Secondary | ICD-10-CM | POA: Diagnosis not present

## 2021-06-01 DIAGNOSIS — D631 Anemia in chronic kidney disease: Secondary | ICD-10-CM | POA: Diagnosis not present

## 2021-06-01 DIAGNOSIS — E118 Type 2 diabetes mellitus with unspecified complications: Secondary | ICD-10-CM | POA: Diagnosis not present

## 2021-06-01 DIAGNOSIS — I159 Secondary hypertension, unspecified: Secondary | ICD-10-CM | POA: Diagnosis not present

## 2021-06-01 DIAGNOSIS — Z992 Dependence on renal dialysis: Secondary | ICD-10-CM | POA: Diagnosis not present

## 2021-06-01 DIAGNOSIS — N2581 Secondary hyperparathyroidism of renal origin: Secondary | ICD-10-CM | POA: Diagnosis not present

## 2021-06-01 DIAGNOSIS — D689 Coagulation defect, unspecified: Secondary | ICD-10-CM | POA: Diagnosis not present

## 2021-06-03 DIAGNOSIS — E118 Type 2 diabetes mellitus with unspecified complications: Secondary | ICD-10-CM | POA: Diagnosis not present

## 2021-06-03 DIAGNOSIS — Z992 Dependence on renal dialysis: Secondary | ICD-10-CM | POA: Diagnosis not present

## 2021-06-03 DIAGNOSIS — D689 Coagulation defect, unspecified: Secondary | ICD-10-CM | POA: Diagnosis not present

## 2021-06-03 DIAGNOSIS — N186 End stage renal disease: Secondary | ICD-10-CM | POA: Diagnosis not present

## 2021-06-03 DIAGNOSIS — I159 Secondary hypertension, unspecified: Secondary | ICD-10-CM | POA: Diagnosis not present

## 2021-06-03 DIAGNOSIS — D631 Anemia in chronic kidney disease: Secondary | ICD-10-CM | POA: Diagnosis not present

## 2021-06-03 DIAGNOSIS — N2581 Secondary hyperparathyroidism of renal origin: Secondary | ICD-10-CM | POA: Diagnosis not present

## 2021-06-06 DIAGNOSIS — D631 Anemia in chronic kidney disease: Secondary | ICD-10-CM | POA: Diagnosis not present

## 2021-06-06 DIAGNOSIS — N2581 Secondary hyperparathyroidism of renal origin: Secondary | ICD-10-CM | POA: Diagnosis not present

## 2021-06-06 DIAGNOSIS — I159 Secondary hypertension, unspecified: Secondary | ICD-10-CM | POA: Diagnosis not present

## 2021-06-06 DIAGNOSIS — N186 End stage renal disease: Secondary | ICD-10-CM | POA: Diagnosis not present

## 2021-06-06 DIAGNOSIS — D689 Coagulation defect, unspecified: Secondary | ICD-10-CM | POA: Diagnosis not present

## 2021-06-06 DIAGNOSIS — Z992 Dependence on renal dialysis: Secondary | ICD-10-CM | POA: Diagnosis not present

## 2021-06-06 DIAGNOSIS — E118 Type 2 diabetes mellitus with unspecified complications: Secondary | ICD-10-CM | POA: Diagnosis not present

## 2021-06-08 DIAGNOSIS — I159 Secondary hypertension, unspecified: Secondary | ICD-10-CM | POA: Diagnosis not present

## 2021-06-08 DIAGNOSIS — N186 End stage renal disease: Secondary | ICD-10-CM | POA: Diagnosis not present

## 2021-06-08 DIAGNOSIS — N2581 Secondary hyperparathyroidism of renal origin: Secondary | ICD-10-CM | POA: Diagnosis not present

## 2021-06-08 DIAGNOSIS — D631 Anemia in chronic kidney disease: Secondary | ICD-10-CM | POA: Diagnosis not present

## 2021-06-08 DIAGNOSIS — Z992 Dependence on renal dialysis: Secondary | ICD-10-CM | POA: Diagnosis not present

## 2021-06-08 DIAGNOSIS — E118 Type 2 diabetes mellitus with unspecified complications: Secondary | ICD-10-CM | POA: Diagnosis not present

## 2021-06-08 DIAGNOSIS — D689 Coagulation defect, unspecified: Secondary | ICD-10-CM | POA: Diagnosis not present

## 2021-06-10 DIAGNOSIS — D689 Coagulation defect, unspecified: Secondary | ICD-10-CM | POA: Diagnosis not present

## 2021-06-10 DIAGNOSIS — N2581 Secondary hyperparathyroidism of renal origin: Secondary | ICD-10-CM | POA: Diagnosis not present

## 2021-06-10 DIAGNOSIS — I159 Secondary hypertension, unspecified: Secondary | ICD-10-CM | POA: Diagnosis not present

## 2021-06-10 DIAGNOSIS — D631 Anemia in chronic kidney disease: Secondary | ICD-10-CM | POA: Diagnosis not present

## 2021-06-10 DIAGNOSIS — Z992 Dependence on renal dialysis: Secondary | ICD-10-CM | POA: Diagnosis not present

## 2021-06-10 DIAGNOSIS — E118 Type 2 diabetes mellitus with unspecified complications: Secondary | ICD-10-CM | POA: Diagnosis not present

## 2021-06-10 DIAGNOSIS — N186 End stage renal disease: Secondary | ICD-10-CM | POA: Diagnosis not present

## 2021-06-13 DIAGNOSIS — D631 Anemia in chronic kidney disease: Secondary | ICD-10-CM | POA: Diagnosis not present

## 2021-06-13 DIAGNOSIS — Z992 Dependence on renal dialysis: Secondary | ICD-10-CM | POA: Diagnosis not present

## 2021-06-13 DIAGNOSIS — D689 Coagulation defect, unspecified: Secondary | ICD-10-CM | POA: Diagnosis not present

## 2021-06-13 DIAGNOSIS — I159 Secondary hypertension, unspecified: Secondary | ICD-10-CM | POA: Diagnosis not present

## 2021-06-13 DIAGNOSIS — N186 End stage renal disease: Secondary | ICD-10-CM | POA: Diagnosis not present

## 2021-06-13 DIAGNOSIS — E118 Type 2 diabetes mellitus with unspecified complications: Secondary | ICD-10-CM | POA: Diagnosis not present

## 2021-06-13 DIAGNOSIS — N2581 Secondary hyperparathyroidism of renal origin: Secondary | ICD-10-CM | POA: Diagnosis not present

## 2021-06-15 DIAGNOSIS — N186 End stage renal disease: Secondary | ICD-10-CM | POA: Diagnosis not present

## 2021-06-15 DIAGNOSIS — E118 Type 2 diabetes mellitus with unspecified complications: Secondary | ICD-10-CM | POA: Diagnosis not present

## 2021-06-15 DIAGNOSIS — D689 Coagulation defect, unspecified: Secondary | ICD-10-CM | POA: Diagnosis not present

## 2021-06-15 DIAGNOSIS — D631 Anemia in chronic kidney disease: Secondary | ICD-10-CM | POA: Diagnosis not present

## 2021-06-15 DIAGNOSIS — Z992 Dependence on renal dialysis: Secondary | ICD-10-CM | POA: Diagnosis not present

## 2021-06-15 DIAGNOSIS — N2581 Secondary hyperparathyroidism of renal origin: Secondary | ICD-10-CM | POA: Diagnosis not present

## 2021-06-15 DIAGNOSIS — I159 Secondary hypertension, unspecified: Secondary | ICD-10-CM | POA: Diagnosis not present

## 2021-06-17 DIAGNOSIS — D689 Coagulation defect, unspecified: Secondary | ICD-10-CM | POA: Diagnosis not present

## 2021-06-17 DIAGNOSIS — I159 Secondary hypertension, unspecified: Secondary | ICD-10-CM | POA: Diagnosis not present

## 2021-06-17 DIAGNOSIS — N2581 Secondary hyperparathyroidism of renal origin: Secondary | ICD-10-CM | POA: Diagnosis not present

## 2021-06-17 DIAGNOSIS — E118 Type 2 diabetes mellitus with unspecified complications: Secondary | ICD-10-CM | POA: Diagnosis not present

## 2021-06-17 DIAGNOSIS — D631 Anemia in chronic kidney disease: Secondary | ICD-10-CM | POA: Diagnosis not present

## 2021-06-17 DIAGNOSIS — Z992 Dependence on renal dialysis: Secondary | ICD-10-CM | POA: Diagnosis not present

## 2021-06-17 DIAGNOSIS — N186 End stage renal disease: Secondary | ICD-10-CM | POA: Diagnosis not present

## 2021-06-20 DIAGNOSIS — D689 Coagulation defect, unspecified: Secondary | ICD-10-CM | POA: Diagnosis not present

## 2021-06-20 DIAGNOSIS — I159 Secondary hypertension, unspecified: Secondary | ICD-10-CM | POA: Diagnosis not present

## 2021-06-20 DIAGNOSIS — D631 Anemia in chronic kidney disease: Secondary | ICD-10-CM | POA: Diagnosis not present

## 2021-06-20 DIAGNOSIS — E118 Type 2 diabetes mellitus with unspecified complications: Secondary | ICD-10-CM | POA: Diagnosis not present

## 2021-06-20 DIAGNOSIS — Z992 Dependence on renal dialysis: Secondary | ICD-10-CM | POA: Diagnosis not present

## 2021-06-20 DIAGNOSIS — N2581 Secondary hyperparathyroidism of renal origin: Secondary | ICD-10-CM | POA: Diagnosis not present

## 2021-06-20 DIAGNOSIS — N186 End stage renal disease: Secondary | ICD-10-CM | POA: Diagnosis not present

## 2021-06-22 DIAGNOSIS — D689 Coagulation defect, unspecified: Secondary | ICD-10-CM | POA: Diagnosis not present

## 2021-06-22 DIAGNOSIS — Z992 Dependence on renal dialysis: Secondary | ICD-10-CM | POA: Diagnosis not present

## 2021-06-22 DIAGNOSIS — E118 Type 2 diabetes mellitus with unspecified complications: Secondary | ICD-10-CM | POA: Diagnosis not present

## 2021-06-22 DIAGNOSIS — N2581 Secondary hyperparathyroidism of renal origin: Secondary | ICD-10-CM | POA: Diagnosis not present

## 2021-06-22 DIAGNOSIS — I159 Secondary hypertension, unspecified: Secondary | ICD-10-CM | POA: Diagnosis not present

## 2021-06-22 DIAGNOSIS — D631 Anemia in chronic kidney disease: Secondary | ICD-10-CM | POA: Diagnosis not present

## 2021-06-22 DIAGNOSIS — N186 End stage renal disease: Secondary | ICD-10-CM | POA: Diagnosis not present

## 2021-06-24 DIAGNOSIS — N2581 Secondary hyperparathyroidism of renal origin: Secondary | ICD-10-CM | POA: Diagnosis not present

## 2021-06-24 DIAGNOSIS — D631 Anemia in chronic kidney disease: Secondary | ICD-10-CM | POA: Diagnosis not present

## 2021-06-24 DIAGNOSIS — N186 End stage renal disease: Secondary | ICD-10-CM | POA: Diagnosis not present

## 2021-06-24 DIAGNOSIS — Z992 Dependence on renal dialysis: Secondary | ICD-10-CM | POA: Diagnosis not present

## 2021-06-24 DIAGNOSIS — D689 Coagulation defect, unspecified: Secondary | ICD-10-CM | POA: Diagnosis not present

## 2021-06-24 DIAGNOSIS — E118 Type 2 diabetes mellitus with unspecified complications: Secondary | ICD-10-CM | POA: Diagnosis not present

## 2021-06-24 DIAGNOSIS — I159 Secondary hypertension, unspecified: Secondary | ICD-10-CM | POA: Diagnosis not present

## 2021-06-26 DIAGNOSIS — E1129 Type 2 diabetes mellitus with other diabetic kidney complication: Secondary | ICD-10-CM | POA: Diagnosis not present

## 2021-06-26 DIAGNOSIS — Z992 Dependence on renal dialysis: Secondary | ICD-10-CM | POA: Diagnosis not present

## 2021-06-26 DIAGNOSIS — N186 End stage renal disease: Secondary | ICD-10-CM | POA: Diagnosis not present

## 2021-06-27 DIAGNOSIS — N186 End stage renal disease: Secondary | ICD-10-CM | POA: Diagnosis not present

## 2021-06-27 DIAGNOSIS — D631 Anemia in chronic kidney disease: Secondary | ICD-10-CM | POA: Diagnosis not present

## 2021-06-27 DIAGNOSIS — N2581 Secondary hyperparathyroidism of renal origin: Secondary | ICD-10-CM | POA: Diagnosis not present

## 2021-06-27 DIAGNOSIS — E118 Type 2 diabetes mellitus with unspecified complications: Secondary | ICD-10-CM | POA: Diagnosis not present

## 2021-06-27 DIAGNOSIS — I159 Secondary hypertension, unspecified: Secondary | ICD-10-CM | POA: Diagnosis not present

## 2021-06-27 DIAGNOSIS — Z992 Dependence on renal dialysis: Secondary | ICD-10-CM | POA: Diagnosis not present

## 2021-06-27 DIAGNOSIS — E1129 Type 2 diabetes mellitus with other diabetic kidney complication: Secondary | ICD-10-CM | POA: Diagnosis not present

## 2021-06-27 DIAGNOSIS — D689 Coagulation defect, unspecified: Secondary | ICD-10-CM | POA: Diagnosis not present

## 2021-06-29 DIAGNOSIS — D631 Anemia in chronic kidney disease: Secondary | ICD-10-CM | POA: Diagnosis not present

## 2021-06-29 DIAGNOSIS — D689 Coagulation defect, unspecified: Secondary | ICD-10-CM | POA: Diagnosis not present

## 2021-06-29 DIAGNOSIS — E118 Type 2 diabetes mellitus with unspecified complications: Secondary | ICD-10-CM | POA: Diagnosis not present

## 2021-06-29 DIAGNOSIS — Z992 Dependence on renal dialysis: Secondary | ICD-10-CM | POA: Diagnosis not present

## 2021-06-29 DIAGNOSIS — I159 Secondary hypertension, unspecified: Secondary | ICD-10-CM | POA: Diagnosis not present

## 2021-06-29 DIAGNOSIS — N2581 Secondary hyperparathyroidism of renal origin: Secondary | ICD-10-CM | POA: Diagnosis not present

## 2021-06-29 DIAGNOSIS — N186 End stage renal disease: Secondary | ICD-10-CM | POA: Diagnosis not present

## 2021-06-30 ENCOUNTER — Telehealth: Payer: Self-pay | Admitting: Cardiology

## 2021-06-30 NOTE — Telephone Encounter (Signed)
Returned call to pt she states that she is having SOB and mid-chest pain that is exertional. Does not have nitro to take. She states that this is scaring her. This is relieved by resting, she states that she rests "about 5 minutes" and this will make it go away. She states that this does not happen every time she is exertional. Recently it has happened Monday when she was getting out of her car, up 4 stairs then to her room. CP started "about half way" to her bedroom. Once again it was relieved by sitting down and resting <5 minutes. She was not able to tell me how often this has happened within the last 2 weeks, but it has happened quite a bit. She states that her BP "is always less than 142/78" she states that this does not happen at rest.  Please advise.

## 2021-06-30 NOTE — Telephone Encounter (Signed)
Pt c/o Shortness Of Breath: STAT if SOB developed within the last 24 hours or pt is noticeably SOB on the phone  1. Are you currently SOB (can you hear that pt is SOB on the phone)? yes  2. How long have you been experiencing SOB? 2 weeks  3. Are you SOB when sitting or when up moving around? both  4. Are you currently experiencing any other symptoms? Chest pain  Pt c/o of Chest Pain: STAT if CP now or developed within 24 hours  1. Are you having CP right now? No  2. Are you experiencing any other symptoms (ex. SOB, nausea, vomiting, sweating)? SOB  3. How long have you been experiencing CP? 2 weeks  4. Is your CP continuous or coming and going? Both  5. Have you taken Nitroglycerin? No ?

## 2021-06-30 NOTE — Telephone Encounter (Signed)
Left detailed message to go to ER or call us for further directions if these symptoms return.

## 2021-07-01 DIAGNOSIS — D631 Anemia in chronic kidney disease: Secondary | ICD-10-CM | POA: Diagnosis not present

## 2021-07-01 DIAGNOSIS — E118 Type 2 diabetes mellitus with unspecified complications: Secondary | ICD-10-CM | POA: Diagnosis not present

## 2021-07-01 DIAGNOSIS — D689 Coagulation defect, unspecified: Secondary | ICD-10-CM | POA: Diagnosis not present

## 2021-07-01 DIAGNOSIS — N186 End stage renal disease: Secondary | ICD-10-CM | POA: Diagnosis not present

## 2021-07-01 DIAGNOSIS — I159 Secondary hypertension, unspecified: Secondary | ICD-10-CM | POA: Diagnosis not present

## 2021-07-01 DIAGNOSIS — Z992 Dependence on renal dialysis: Secondary | ICD-10-CM | POA: Diagnosis not present

## 2021-07-01 DIAGNOSIS — N2581 Secondary hyperparathyroidism of renal origin: Secondary | ICD-10-CM | POA: Diagnosis not present

## 2021-07-04 DIAGNOSIS — D689 Coagulation defect, unspecified: Secondary | ICD-10-CM | POA: Diagnosis not present

## 2021-07-04 DIAGNOSIS — N2581 Secondary hyperparathyroidism of renal origin: Secondary | ICD-10-CM | POA: Diagnosis not present

## 2021-07-04 DIAGNOSIS — I159 Secondary hypertension, unspecified: Secondary | ICD-10-CM | POA: Diagnosis not present

## 2021-07-04 DIAGNOSIS — Z992 Dependence on renal dialysis: Secondary | ICD-10-CM | POA: Diagnosis not present

## 2021-07-04 DIAGNOSIS — D631 Anemia in chronic kidney disease: Secondary | ICD-10-CM | POA: Diagnosis not present

## 2021-07-04 DIAGNOSIS — N186 End stage renal disease: Secondary | ICD-10-CM | POA: Diagnosis not present

## 2021-07-04 DIAGNOSIS — E118 Type 2 diabetes mellitus with unspecified complications: Secondary | ICD-10-CM | POA: Diagnosis not present

## 2021-07-04 NOTE — Telephone Encounter (Signed)
Opened in error

## 2021-07-04 NOTE — Telephone Encounter (Signed)
Left message for pt to call to schedule follow up appointment if continuing to have problems and to go to the ER.

## 2021-07-06 DIAGNOSIS — I159 Secondary hypertension, unspecified: Secondary | ICD-10-CM | POA: Diagnosis not present

## 2021-07-06 DIAGNOSIS — N186 End stage renal disease: Secondary | ICD-10-CM | POA: Diagnosis not present

## 2021-07-06 DIAGNOSIS — D631 Anemia in chronic kidney disease: Secondary | ICD-10-CM | POA: Diagnosis not present

## 2021-07-06 DIAGNOSIS — E118 Type 2 diabetes mellitus with unspecified complications: Secondary | ICD-10-CM | POA: Diagnosis not present

## 2021-07-06 DIAGNOSIS — Z992 Dependence on renal dialysis: Secondary | ICD-10-CM | POA: Diagnosis not present

## 2021-07-06 DIAGNOSIS — N2581 Secondary hyperparathyroidism of renal origin: Secondary | ICD-10-CM | POA: Diagnosis not present

## 2021-07-06 DIAGNOSIS — D689 Coagulation defect, unspecified: Secondary | ICD-10-CM | POA: Diagnosis not present

## 2021-07-08 DIAGNOSIS — D631 Anemia in chronic kidney disease: Secondary | ICD-10-CM | POA: Diagnosis not present

## 2021-07-08 DIAGNOSIS — Z992 Dependence on renal dialysis: Secondary | ICD-10-CM | POA: Diagnosis not present

## 2021-07-08 DIAGNOSIS — N186 End stage renal disease: Secondary | ICD-10-CM | POA: Diagnosis not present

## 2021-07-08 DIAGNOSIS — I159 Secondary hypertension, unspecified: Secondary | ICD-10-CM | POA: Diagnosis not present

## 2021-07-08 DIAGNOSIS — N2581 Secondary hyperparathyroidism of renal origin: Secondary | ICD-10-CM | POA: Diagnosis not present

## 2021-07-08 DIAGNOSIS — E118 Type 2 diabetes mellitus with unspecified complications: Secondary | ICD-10-CM | POA: Diagnosis not present

## 2021-07-08 DIAGNOSIS — D689 Coagulation defect, unspecified: Secondary | ICD-10-CM | POA: Diagnosis not present

## 2021-07-11 DIAGNOSIS — N2581 Secondary hyperparathyroidism of renal origin: Secondary | ICD-10-CM | POA: Diagnosis not present

## 2021-07-11 DIAGNOSIS — D631 Anemia in chronic kidney disease: Secondary | ICD-10-CM | POA: Diagnosis not present

## 2021-07-11 DIAGNOSIS — I159 Secondary hypertension, unspecified: Secondary | ICD-10-CM | POA: Diagnosis not present

## 2021-07-11 DIAGNOSIS — Z992 Dependence on renal dialysis: Secondary | ICD-10-CM | POA: Diagnosis not present

## 2021-07-11 DIAGNOSIS — D689 Coagulation defect, unspecified: Secondary | ICD-10-CM | POA: Diagnosis not present

## 2021-07-11 DIAGNOSIS — N186 End stage renal disease: Secondary | ICD-10-CM | POA: Diagnosis not present

## 2021-07-11 DIAGNOSIS — E118 Type 2 diabetes mellitus with unspecified complications: Secondary | ICD-10-CM | POA: Diagnosis not present

## 2021-07-13 DIAGNOSIS — N2581 Secondary hyperparathyroidism of renal origin: Secondary | ICD-10-CM | POA: Diagnosis not present

## 2021-07-13 DIAGNOSIS — I159 Secondary hypertension, unspecified: Secondary | ICD-10-CM | POA: Diagnosis not present

## 2021-07-13 DIAGNOSIS — D689 Coagulation defect, unspecified: Secondary | ICD-10-CM | POA: Diagnosis not present

## 2021-07-13 DIAGNOSIS — N186 End stage renal disease: Secondary | ICD-10-CM | POA: Diagnosis not present

## 2021-07-13 DIAGNOSIS — Z992 Dependence on renal dialysis: Secondary | ICD-10-CM | POA: Diagnosis not present

## 2021-07-13 DIAGNOSIS — D631 Anemia in chronic kidney disease: Secondary | ICD-10-CM | POA: Diagnosis not present

## 2021-07-13 DIAGNOSIS — E118 Type 2 diabetes mellitus with unspecified complications: Secondary | ICD-10-CM | POA: Diagnosis not present

## 2021-07-15 DIAGNOSIS — I159 Secondary hypertension, unspecified: Secondary | ICD-10-CM | POA: Diagnosis not present

## 2021-07-15 DIAGNOSIS — N2581 Secondary hyperparathyroidism of renal origin: Secondary | ICD-10-CM | POA: Diagnosis not present

## 2021-07-15 DIAGNOSIS — E118 Type 2 diabetes mellitus with unspecified complications: Secondary | ICD-10-CM | POA: Diagnosis not present

## 2021-07-15 DIAGNOSIS — D689 Coagulation defect, unspecified: Secondary | ICD-10-CM | POA: Diagnosis not present

## 2021-07-15 DIAGNOSIS — N186 End stage renal disease: Secondary | ICD-10-CM | POA: Diagnosis not present

## 2021-07-15 DIAGNOSIS — Z992 Dependence on renal dialysis: Secondary | ICD-10-CM | POA: Diagnosis not present

## 2021-07-15 DIAGNOSIS — D631 Anemia in chronic kidney disease: Secondary | ICD-10-CM | POA: Diagnosis not present

## 2021-07-18 DIAGNOSIS — E118 Type 2 diabetes mellitus with unspecified complications: Secondary | ICD-10-CM | POA: Diagnosis not present

## 2021-07-18 DIAGNOSIS — D689 Coagulation defect, unspecified: Secondary | ICD-10-CM | POA: Diagnosis not present

## 2021-07-18 DIAGNOSIS — Z992 Dependence on renal dialysis: Secondary | ICD-10-CM | POA: Diagnosis not present

## 2021-07-18 DIAGNOSIS — I159 Secondary hypertension, unspecified: Secondary | ICD-10-CM | POA: Diagnosis not present

## 2021-07-18 DIAGNOSIS — D631 Anemia in chronic kidney disease: Secondary | ICD-10-CM | POA: Diagnosis not present

## 2021-07-18 DIAGNOSIS — N186 End stage renal disease: Secondary | ICD-10-CM | POA: Diagnosis not present

## 2021-07-18 DIAGNOSIS — N2581 Secondary hyperparathyroidism of renal origin: Secondary | ICD-10-CM | POA: Diagnosis not present

## 2021-07-20 DIAGNOSIS — D689 Coagulation defect, unspecified: Secondary | ICD-10-CM | POA: Diagnosis not present

## 2021-07-20 DIAGNOSIS — Z992 Dependence on renal dialysis: Secondary | ICD-10-CM | POA: Diagnosis not present

## 2021-07-20 DIAGNOSIS — N186 End stage renal disease: Secondary | ICD-10-CM | POA: Diagnosis not present

## 2021-07-20 DIAGNOSIS — N2581 Secondary hyperparathyroidism of renal origin: Secondary | ICD-10-CM | POA: Diagnosis not present

## 2021-07-20 DIAGNOSIS — I159 Secondary hypertension, unspecified: Secondary | ICD-10-CM | POA: Diagnosis not present

## 2021-07-20 DIAGNOSIS — D631 Anemia in chronic kidney disease: Secondary | ICD-10-CM | POA: Diagnosis not present

## 2021-07-20 DIAGNOSIS — E118 Type 2 diabetes mellitus with unspecified complications: Secondary | ICD-10-CM | POA: Diagnosis not present

## 2021-07-22 DIAGNOSIS — N186 End stage renal disease: Secondary | ICD-10-CM | POA: Diagnosis not present

## 2021-07-22 DIAGNOSIS — D631 Anemia in chronic kidney disease: Secondary | ICD-10-CM | POA: Diagnosis not present

## 2021-07-22 DIAGNOSIS — N2581 Secondary hyperparathyroidism of renal origin: Secondary | ICD-10-CM | POA: Diagnosis not present

## 2021-07-22 DIAGNOSIS — D689 Coagulation defect, unspecified: Secondary | ICD-10-CM | POA: Diagnosis not present

## 2021-07-22 DIAGNOSIS — Z992 Dependence on renal dialysis: Secondary | ICD-10-CM | POA: Diagnosis not present

## 2021-07-22 DIAGNOSIS — E118 Type 2 diabetes mellitus with unspecified complications: Secondary | ICD-10-CM | POA: Diagnosis not present

## 2021-07-22 DIAGNOSIS — I159 Secondary hypertension, unspecified: Secondary | ICD-10-CM | POA: Diagnosis not present

## 2021-07-25 DIAGNOSIS — Z992 Dependence on renal dialysis: Secondary | ICD-10-CM | POA: Diagnosis not present

## 2021-07-25 DIAGNOSIS — D689 Coagulation defect, unspecified: Secondary | ICD-10-CM | POA: Diagnosis not present

## 2021-07-25 DIAGNOSIS — D631 Anemia in chronic kidney disease: Secondary | ICD-10-CM | POA: Diagnosis not present

## 2021-07-25 DIAGNOSIS — I159 Secondary hypertension, unspecified: Secondary | ICD-10-CM | POA: Diagnosis not present

## 2021-07-25 DIAGNOSIS — E118 Type 2 diabetes mellitus with unspecified complications: Secondary | ICD-10-CM | POA: Diagnosis not present

## 2021-07-25 DIAGNOSIS — N186 End stage renal disease: Secondary | ICD-10-CM | POA: Diagnosis not present

## 2021-07-25 DIAGNOSIS — N2581 Secondary hyperparathyroidism of renal origin: Secondary | ICD-10-CM | POA: Diagnosis not present

## 2021-07-27 DIAGNOSIS — E118 Type 2 diabetes mellitus with unspecified complications: Secondary | ICD-10-CM | POA: Diagnosis not present

## 2021-07-27 DIAGNOSIS — N186 End stage renal disease: Secondary | ICD-10-CM | POA: Diagnosis not present

## 2021-07-27 DIAGNOSIS — D631 Anemia in chronic kidney disease: Secondary | ICD-10-CM | POA: Diagnosis not present

## 2021-07-27 DIAGNOSIS — N2581 Secondary hyperparathyroidism of renal origin: Secondary | ICD-10-CM | POA: Diagnosis not present

## 2021-07-27 DIAGNOSIS — D689 Coagulation defect, unspecified: Secondary | ICD-10-CM | POA: Diagnosis not present

## 2021-07-27 DIAGNOSIS — Z992 Dependence on renal dialysis: Secondary | ICD-10-CM | POA: Diagnosis not present

## 2021-07-27 DIAGNOSIS — I159 Secondary hypertension, unspecified: Secondary | ICD-10-CM | POA: Diagnosis not present

## 2021-07-29 DIAGNOSIS — D631 Anemia in chronic kidney disease: Secondary | ICD-10-CM | POA: Diagnosis not present

## 2021-07-29 DIAGNOSIS — N186 End stage renal disease: Secondary | ICD-10-CM | POA: Diagnosis not present

## 2021-07-29 DIAGNOSIS — N2581 Secondary hyperparathyroidism of renal origin: Secondary | ICD-10-CM | POA: Diagnosis not present

## 2021-07-29 DIAGNOSIS — I159 Secondary hypertension, unspecified: Secondary | ICD-10-CM | POA: Diagnosis not present

## 2021-07-29 DIAGNOSIS — E118 Type 2 diabetes mellitus with unspecified complications: Secondary | ICD-10-CM | POA: Diagnosis not present

## 2021-07-29 DIAGNOSIS — Z992 Dependence on renal dialysis: Secondary | ICD-10-CM | POA: Diagnosis not present

## 2021-07-29 DIAGNOSIS — D689 Coagulation defect, unspecified: Secondary | ICD-10-CM | POA: Diagnosis not present

## 2021-08-01 DIAGNOSIS — D631 Anemia in chronic kidney disease: Secondary | ICD-10-CM | POA: Diagnosis not present

## 2021-08-01 DIAGNOSIS — N2581 Secondary hyperparathyroidism of renal origin: Secondary | ICD-10-CM | POA: Diagnosis not present

## 2021-08-01 DIAGNOSIS — N186 End stage renal disease: Secondary | ICD-10-CM | POA: Diagnosis not present

## 2021-08-01 DIAGNOSIS — E118 Type 2 diabetes mellitus with unspecified complications: Secondary | ICD-10-CM | POA: Diagnosis not present

## 2021-08-01 DIAGNOSIS — I159 Secondary hypertension, unspecified: Secondary | ICD-10-CM | POA: Diagnosis not present

## 2021-08-01 DIAGNOSIS — Z992 Dependence on renal dialysis: Secondary | ICD-10-CM | POA: Diagnosis not present

## 2021-08-01 DIAGNOSIS — D689 Coagulation defect, unspecified: Secondary | ICD-10-CM | POA: Diagnosis not present

## 2021-08-03 DIAGNOSIS — E118 Type 2 diabetes mellitus with unspecified complications: Secondary | ICD-10-CM | POA: Diagnosis not present

## 2021-08-03 DIAGNOSIS — N186 End stage renal disease: Secondary | ICD-10-CM | POA: Diagnosis not present

## 2021-08-03 DIAGNOSIS — D631 Anemia in chronic kidney disease: Secondary | ICD-10-CM | POA: Diagnosis not present

## 2021-08-03 DIAGNOSIS — D689 Coagulation defect, unspecified: Secondary | ICD-10-CM | POA: Diagnosis not present

## 2021-08-03 DIAGNOSIS — N2581 Secondary hyperparathyroidism of renal origin: Secondary | ICD-10-CM | POA: Diagnosis not present

## 2021-08-03 DIAGNOSIS — I159 Secondary hypertension, unspecified: Secondary | ICD-10-CM | POA: Diagnosis not present

## 2021-08-03 DIAGNOSIS — Z992 Dependence on renal dialysis: Secondary | ICD-10-CM | POA: Diagnosis not present

## 2021-08-05 DIAGNOSIS — Z992 Dependence on renal dialysis: Secondary | ICD-10-CM | POA: Diagnosis not present

## 2021-08-05 DIAGNOSIS — D689 Coagulation defect, unspecified: Secondary | ICD-10-CM | POA: Diagnosis not present

## 2021-08-05 DIAGNOSIS — D631 Anemia in chronic kidney disease: Secondary | ICD-10-CM | POA: Diagnosis not present

## 2021-08-05 DIAGNOSIS — N186 End stage renal disease: Secondary | ICD-10-CM | POA: Diagnosis not present

## 2021-08-05 DIAGNOSIS — I159 Secondary hypertension, unspecified: Secondary | ICD-10-CM | POA: Diagnosis not present

## 2021-08-05 DIAGNOSIS — E118 Type 2 diabetes mellitus with unspecified complications: Secondary | ICD-10-CM | POA: Diagnosis not present

## 2021-08-05 DIAGNOSIS — N2581 Secondary hyperparathyroidism of renal origin: Secondary | ICD-10-CM | POA: Diagnosis not present

## 2021-08-08 DIAGNOSIS — E118 Type 2 diabetes mellitus with unspecified complications: Secondary | ICD-10-CM | POA: Diagnosis not present

## 2021-08-08 DIAGNOSIS — N2581 Secondary hyperparathyroidism of renal origin: Secondary | ICD-10-CM | POA: Diagnosis not present

## 2021-08-08 DIAGNOSIS — N186 End stage renal disease: Secondary | ICD-10-CM | POA: Diagnosis not present

## 2021-08-08 DIAGNOSIS — I159 Secondary hypertension, unspecified: Secondary | ICD-10-CM | POA: Diagnosis not present

## 2021-08-08 DIAGNOSIS — D689 Coagulation defect, unspecified: Secondary | ICD-10-CM | POA: Diagnosis not present

## 2021-08-08 DIAGNOSIS — D631 Anemia in chronic kidney disease: Secondary | ICD-10-CM | POA: Diagnosis not present

## 2021-08-08 DIAGNOSIS — Z992 Dependence on renal dialysis: Secondary | ICD-10-CM | POA: Diagnosis not present

## 2021-08-10 DIAGNOSIS — D631 Anemia in chronic kidney disease: Secondary | ICD-10-CM | POA: Diagnosis not present

## 2021-08-10 DIAGNOSIS — N186 End stage renal disease: Secondary | ICD-10-CM | POA: Diagnosis not present

## 2021-08-10 DIAGNOSIS — N2581 Secondary hyperparathyroidism of renal origin: Secondary | ICD-10-CM | POA: Diagnosis not present

## 2021-08-10 DIAGNOSIS — Z992 Dependence on renal dialysis: Secondary | ICD-10-CM | POA: Diagnosis not present

## 2021-08-10 DIAGNOSIS — D689 Coagulation defect, unspecified: Secondary | ICD-10-CM | POA: Diagnosis not present

## 2021-08-10 DIAGNOSIS — E118 Type 2 diabetes mellitus with unspecified complications: Secondary | ICD-10-CM | POA: Diagnosis not present

## 2021-08-10 DIAGNOSIS — I159 Secondary hypertension, unspecified: Secondary | ICD-10-CM | POA: Diagnosis not present

## 2021-08-12 DIAGNOSIS — N2581 Secondary hyperparathyroidism of renal origin: Secondary | ICD-10-CM | POA: Diagnosis not present

## 2021-08-12 DIAGNOSIS — E118 Type 2 diabetes mellitus with unspecified complications: Secondary | ICD-10-CM | POA: Diagnosis not present

## 2021-08-12 DIAGNOSIS — D631 Anemia in chronic kidney disease: Secondary | ICD-10-CM | POA: Diagnosis not present

## 2021-08-12 DIAGNOSIS — Z992 Dependence on renal dialysis: Secondary | ICD-10-CM | POA: Diagnosis not present

## 2021-08-12 DIAGNOSIS — D689 Coagulation defect, unspecified: Secondary | ICD-10-CM | POA: Diagnosis not present

## 2021-08-12 DIAGNOSIS — N186 End stage renal disease: Secondary | ICD-10-CM | POA: Diagnosis not present

## 2021-08-12 DIAGNOSIS — I159 Secondary hypertension, unspecified: Secondary | ICD-10-CM | POA: Diagnosis not present

## 2021-08-15 DIAGNOSIS — D631 Anemia in chronic kidney disease: Secondary | ICD-10-CM | POA: Diagnosis not present

## 2021-08-15 DIAGNOSIS — Z992 Dependence on renal dialysis: Secondary | ICD-10-CM | POA: Diagnosis not present

## 2021-08-15 DIAGNOSIS — N186 End stage renal disease: Secondary | ICD-10-CM | POA: Diagnosis not present

## 2021-08-15 DIAGNOSIS — E118 Type 2 diabetes mellitus with unspecified complications: Secondary | ICD-10-CM | POA: Diagnosis not present

## 2021-08-15 DIAGNOSIS — D689 Coagulation defect, unspecified: Secondary | ICD-10-CM | POA: Diagnosis not present

## 2021-08-15 DIAGNOSIS — N2581 Secondary hyperparathyroidism of renal origin: Secondary | ICD-10-CM | POA: Diagnosis not present

## 2021-08-15 DIAGNOSIS — I159 Secondary hypertension, unspecified: Secondary | ICD-10-CM | POA: Diagnosis not present

## 2021-08-17 DIAGNOSIS — N2581 Secondary hyperparathyroidism of renal origin: Secondary | ICD-10-CM | POA: Diagnosis not present

## 2021-08-17 DIAGNOSIS — D689 Coagulation defect, unspecified: Secondary | ICD-10-CM | POA: Diagnosis not present

## 2021-08-17 DIAGNOSIS — Z992 Dependence on renal dialysis: Secondary | ICD-10-CM | POA: Diagnosis not present

## 2021-08-17 DIAGNOSIS — D631 Anemia in chronic kidney disease: Secondary | ICD-10-CM | POA: Diagnosis not present

## 2021-08-17 DIAGNOSIS — E118 Type 2 diabetes mellitus with unspecified complications: Secondary | ICD-10-CM | POA: Diagnosis not present

## 2021-08-17 DIAGNOSIS — I159 Secondary hypertension, unspecified: Secondary | ICD-10-CM | POA: Diagnosis not present

## 2021-08-17 DIAGNOSIS — N186 End stage renal disease: Secondary | ICD-10-CM | POA: Diagnosis not present

## 2021-08-19 DIAGNOSIS — D689 Coagulation defect, unspecified: Secondary | ICD-10-CM | POA: Diagnosis not present

## 2021-08-19 DIAGNOSIS — I159 Secondary hypertension, unspecified: Secondary | ICD-10-CM | POA: Diagnosis not present

## 2021-08-19 DIAGNOSIS — E118 Type 2 diabetes mellitus with unspecified complications: Secondary | ICD-10-CM | POA: Diagnosis not present

## 2021-08-19 DIAGNOSIS — Z992 Dependence on renal dialysis: Secondary | ICD-10-CM | POA: Diagnosis not present

## 2021-08-19 DIAGNOSIS — N2581 Secondary hyperparathyroidism of renal origin: Secondary | ICD-10-CM | POA: Diagnosis not present

## 2021-08-19 DIAGNOSIS — N186 End stage renal disease: Secondary | ICD-10-CM | POA: Diagnosis not present

## 2021-08-19 DIAGNOSIS — D631 Anemia in chronic kidney disease: Secondary | ICD-10-CM | POA: Diagnosis not present

## 2021-08-22 DIAGNOSIS — D689 Coagulation defect, unspecified: Secondary | ICD-10-CM | POA: Diagnosis not present

## 2021-08-22 DIAGNOSIS — I159 Secondary hypertension, unspecified: Secondary | ICD-10-CM | POA: Diagnosis not present

## 2021-08-22 DIAGNOSIS — Z992 Dependence on renal dialysis: Secondary | ICD-10-CM | POA: Diagnosis not present

## 2021-08-22 DIAGNOSIS — N2581 Secondary hyperparathyroidism of renal origin: Secondary | ICD-10-CM | POA: Diagnosis not present

## 2021-08-22 DIAGNOSIS — N186 End stage renal disease: Secondary | ICD-10-CM | POA: Diagnosis not present

## 2021-08-22 DIAGNOSIS — E118 Type 2 diabetes mellitus with unspecified complications: Secondary | ICD-10-CM | POA: Diagnosis not present

## 2021-08-22 DIAGNOSIS — D631 Anemia in chronic kidney disease: Secondary | ICD-10-CM | POA: Diagnosis not present

## 2021-08-24 DIAGNOSIS — N186 End stage renal disease: Secondary | ICD-10-CM | POA: Diagnosis not present

## 2021-08-24 DIAGNOSIS — D631 Anemia in chronic kidney disease: Secondary | ICD-10-CM | POA: Diagnosis not present

## 2021-08-24 DIAGNOSIS — N2581 Secondary hyperparathyroidism of renal origin: Secondary | ICD-10-CM | POA: Diagnosis not present

## 2021-08-24 DIAGNOSIS — I159 Secondary hypertension, unspecified: Secondary | ICD-10-CM | POA: Diagnosis not present

## 2021-08-24 DIAGNOSIS — E118 Type 2 diabetes mellitus with unspecified complications: Secondary | ICD-10-CM | POA: Diagnosis not present

## 2021-08-24 DIAGNOSIS — D689 Coagulation defect, unspecified: Secondary | ICD-10-CM | POA: Diagnosis not present

## 2021-08-24 DIAGNOSIS — Z992 Dependence on renal dialysis: Secondary | ICD-10-CM | POA: Diagnosis not present

## 2021-08-25 DIAGNOSIS — I871 Compression of vein: Secondary | ICD-10-CM | POA: Diagnosis not present

## 2021-08-25 DIAGNOSIS — T82858A Stenosis of vascular prosthetic devices, implants and grafts, initial encounter: Secondary | ICD-10-CM | POA: Diagnosis not present

## 2021-08-25 DIAGNOSIS — N186 End stage renal disease: Secondary | ICD-10-CM | POA: Diagnosis not present

## 2021-08-25 DIAGNOSIS — Z992 Dependence on renal dialysis: Secondary | ICD-10-CM | POA: Diagnosis not present

## 2021-08-26 ENCOUNTER — Telehealth: Payer: Self-pay | Admitting: Cardiology

## 2021-08-26 DIAGNOSIS — D631 Anemia in chronic kidney disease: Secondary | ICD-10-CM | POA: Diagnosis not present

## 2021-08-26 DIAGNOSIS — Z992 Dependence on renal dialysis: Secondary | ICD-10-CM | POA: Diagnosis not present

## 2021-08-26 DIAGNOSIS — E118 Type 2 diabetes mellitus with unspecified complications: Secondary | ICD-10-CM | POA: Diagnosis not present

## 2021-08-26 DIAGNOSIS — E1129 Type 2 diabetes mellitus with other diabetic kidney complication: Secondary | ICD-10-CM | POA: Diagnosis not present

## 2021-08-26 DIAGNOSIS — I159 Secondary hypertension, unspecified: Secondary | ICD-10-CM | POA: Diagnosis not present

## 2021-08-26 DIAGNOSIS — N2581 Secondary hyperparathyroidism of renal origin: Secondary | ICD-10-CM | POA: Diagnosis not present

## 2021-08-26 DIAGNOSIS — N186 End stage renal disease: Secondary | ICD-10-CM | POA: Diagnosis not present

## 2021-08-26 DIAGNOSIS — D689 Coagulation defect, unspecified: Secondary | ICD-10-CM | POA: Diagnosis not present

## 2021-08-26 NOTE — Telephone Encounter (Signed)
Patient was retuning call

## 2021-08-26 NOTE — Telephone Encounter (Signed)
Left a message for the patient to call back and ask for Debra.

## 2021-08-26 NOTE — Telephone Encounter (Signed)
Patient states she is returning a call from [place] today.

## 2021-08-27 DIAGNOSIS — Z992 Dependence on renal dialysis: Secondary | ICD-10-CM | POA: Diagnosis not present

## 2021-08-27 DIAGNOSIS — N186 End stage renal disease: Secondary | ICD-10-CM | POA: Diagnosis not present

## 2021-08-27 DIAGNOSIS — E1129 Type 2 diabetes mellitus with other diabetic kidney complication: Secondary | ICD-10-CM | POA: Diagnosis not present

## 2021-08-28 ENCOUNTER — Observation Stay (HOSPITAL_COMMUNITY)
Admission: EM | Admit: 2021-08-28 | Discharge: 2021-08-29 | Disposition: A | Discharge status: Home / Self Care | Payer: Medicare Other | Attending: Internal Medicine | Admitting: Internal Medicine

## 2021-08-28 ENCOUNTER — Observation Stay (HOSPITAL_BASED_OUTPATIENT_CLINIC_OR_DEPARTMENT_OTHER): Payer: Medicare Other

## 2021-08-28 ENCOUNTER — Emergency Department (HOSPITAL_COMMUNITY): Payer: Medicare Other

## 2021-08-28 ENCOUNTER — Other Ambulatory Visit: Payer: Self-pay

## 2021-08-28 ENCOUNTER — Observation Stay (HOSPITAL_COMMUNITY): Payer: Medicare Other

## 2021-08-28 ENCOUNTER — Encounter (HOSPITAL_COMMUNITY): Payer: Self-pay | Admitting: *Deleted

## 2021-08-28 DIAGNOSIS — R072 Precordial pain: Secondary | ICD-10-CM | POA: Diagnosis not present

## 2021-08-28 DIAGNOSIS — Z8673 Personal history of transient ischemic attack (TIA), and cerebral infarction without residual deficits: Secondary | ICD-10-CM | POA: Insufficient documentation

## 2021-08-28 DIAGNOSIS — Z992 Dependence on renal dialysis: Secondary | ICD-10-CM | POA: Insufficient documentation

## 2021-08-28 DIAGNOSIS — I429 Cardiomyopathy, unspecified: Secondary | ICD-10-CM

## 2021-08-28 DIAGNOSIS — Z20822 Contact with and (suspected) exposure to covid-19: Secondary | ICD-10-CM | POA: Diagnosis not present

## 2021-08-28 DIAGNOSIS — R0789 Other chest pain: Secondary | ICD-10-CM | POA: Diagnosis not present

## 2021-08-28 DIAGNOSIS — R778 Other specified abnormalities of plasma proteins: Secondary | ICD-10-CM | POA: Diagnosis not present

## 2021-08-28 DIAGNOSIS — N186 End stage renal disease: Secondary | ICD-10-CM | POA: Diagnosis not present

## 2021-08-28 DIAGNOSIS — Z7901 Long term (current) use of anticoagulants: Secondary | ICD-10-CM | POA: Diagnosis not present

## 2021-08-28 DIAGNOSIS — I132 Hypertensive heart and chronic kidney disease with heart failure and with stage 5 chronic kidney disease, or end stage renal disease: Secondary | ICD-10-CM | POA: Diagnosis not present

## 2021-08-28 DIAGNOSIS — Z79899 Other long term (current) drug therapy: Secondary | ICD-10-CM | POA: Insufficient documentation

## 2021-08-28 DIAGNOSIS — R079 Chest pain, unspecified: Secondary | ICD-10-CM | POA: Diagnosis present

## 2021-08-28 DIAGNOSIS — R0782 Intercostal pain: Secondary | ICD-10-CM

## 2021-08-28 DIAGNOSIS — J9811 Atelectasis: Secondary | ICD-10-CM | POA: Diagnosis not present

## 2021-08-28 DIAGNOSIS — I5032 Chronic diastolic (congestive) heart failure: Secondary | ICD-10-CM | POA: Diagnosis not present

## 2021-08-28 DIAGNOSIS — E1169 Type 2 diabetes mellitus with other specified complication: Secondary | ICD-10-CM | POA: Diagnosis present

## 2021-08-28 DIAGNOSIS — I10 Essential (primary) hypertension: Secondary | ICD-10-CM | POA: Diagnosis present

## 2021-08-28 DIAGNOSIS — R7989 Other specified abnormal findings of blood chemistry: Secondary | ICD-10-CM

## 2021-08-28 DIAGNOSIS — E1122 Type 2 diabetes mellitus with diabetic chronic kidney disease: Secondary | ICD-10-CM | POA: Diagnosis not present

## 2021-08-28 DIAGNOSIS — R9431 Abnormal electrocardiogram [ECG] [EKG]: Secondary | ICD-10-CM | POA: Diagnosis not present

## 2021-08-28 DIAGNOSIS — I5042 Chronic combined systolic (congestive) and diastolic (congestive) heart failure: Secondary | ICD-10-CM | POA: Diagnosis present

## 2021-08-28 DIAGNOSIS — E669 Obesity, unspecified: Secondary | ICD-10-CM | POA: Diagnosis present

## 2021-08-28 DIAGNOSIS — Z9889 Other specified postprocedural states: Secondary | ICD-10-CM

## 2021-08-28 DIAGNOSIS — Z7982 Long term (current) use of aspirin: Secondary | ICD-10-CM | POA: Diagnosis not present

## 2021-08-28 DIAGNOSIS — I272 Pulmonary hypertension, unspecified: Secondary | ICD-10-CM | POA: Diagnosis present

## 2021-08-28 DIAGNOSIS — E119 Type 2 diabetes mellitus without complications: Secondary | ICD-10-CM

## 2021-08-28 DIAGNOSIS — I517 Cardiomegaly: Secondary | ICD-10-CM | POA: Diagnosis not present

## 2021-08-28 LAB — TROPONIN I (HIGH SENSITIVITY)
Troponin I (High Sensitivity): 101 ng/L (ref <18)
Troponin I (High Sensitivity): 104 ng/L (ref <18)
Troponin I (High Sensitivity): 126 ng/L (ref <18)
Troponin I (High Sensitivity): 161 ng/L (ref <18)
Troponin I (High Sensitivity): 188 ng/L (ref <18)

## 2021-08-28 LAB — MRSA NEXT GEN BY PCR, NASAL: MRSA by PCR Next Gen: NOT DETECTED

## 2021-08-28 LAB — ECHOCARDIOGRAM COMPLETE
AR max vel: 2.49 cm2
AV Area VTI: 2.45 cm2
AV Area mean vel: 2.43 cm2
AV Mean grad: 4 mmHg
AV Peak grad: 7 mmHg
Ao pk vel: 1.32 m/s
Height: 59 in
MV VTI: 1.3 cm2
S' Lateral: 3.2 cm
Weight: 2751.34 oz

## 2021-08-28 LAB — BASIC METABOLIC PANEL
Anion gap: 15 (ref 5–15)
BUN: 42 mg/dL — ABNORMAL HIGH (ref 8–23)
CO2: 29 mmol/L (ref 22–32)
Calcium: 9.5 mg/dL (ref 8.9–10.3)
Chloride: 94 mmol/L — ABNORMAL LOW (ref 98–111)
Creatinine, Ser: 7.85 mg/dL — ABNORMAL HIGH (ref 0.44–1.00)
GFR, Estimated: 5 mL/min — ABNORMAL LOW (ref >60)
Glucose, Bld: 108 mg/dL — ABNORMAL HIGH (ref 70–99)
Potassium: 4.1 mmol/L (ref 3.5–5.1)
Sodium: 138 mmol/L (ref 135–145)

## 2021-08-28 LAB — CBG MONITORING, ED
Glucose-Capillary: 85 mg/dL (ref 70–99)
Glucose-Capillary: 92 mg/dL (ref 70–99)
Glucose-Capillary: 105 mg/dL — ABNORMAL HIGH (ref 70–99)
Glucose-Capillary: 100 mg/dL — ABNORMAL HIGH (ref 70–99)

## 2021-08-28 LAB — CBC
HCT: 33.1 % — ABNORMAL LOW (ref 36.0–46.0)
Hemoglobin: 10.4 g/dL — ABNORMAL LOW (ref 12.0–15.0)
MCH: 27.7 pg (ref 26.0–34.0)
MCHC: 31.4 g/dL (ref 30.0–36.0)
MCV: 88 fL (ref 80.0–100.0)
Platelets: 185 10*3/uL (ref 150–400)
RBC: 3.76 MIL/uL — ABNORMAL LOW (ref 3.87–5.11)
RDW: 16.9 % — ABNORMAL HIGH (ref 11.5–15.5)
WBC: 5.1 10*3/uL (ref 4.0–10.5)
nRBC: 0 % (ref 0.0–0.2)

## 2021-08-28 LAB — HEPATIC FUNCTION PANEL
ALT: 11 U/L (ref 0–44)
AST: 11 U/L — ABNORMAL LOW (ref 15–41)
Albumin: 3.7 g/dL (ref 3.5–5.0)
Alkaline Phosphatase: 76 U/L (ref 38–126)
Bilirubin, Direct: <0.1 mg/dL (ref 0.0–0.2)
Total Bilirubin: 0.6 mg/dL (ref 0.3–1.2)
Total Protein: 7.6 g/dL (ref 6.5–8.1)

## 2021-08-28 LAB — HEPATITIS B SURFACE ANTIGEN: Hepatitis B Surface Ag: NONREACTIVE

## 2021-08-28 LAB — PROTIME-INR
INR: 1.1 (ref 0.8–1.2)
Prothrombin Time: 14.2 seconds (ref 11.4–15.2)

## 2021-08-28 LAB — GLUCOSE, CAPILLARY: Glucose-Capillary: 149 mg/dL — ABNORMAL HIGH (ref 70–99)

## 2021-08-28 LAB — SARS CORONAVIRUS 2 (TAT 6-24 HRS): SARS Coronavirus 2: NEGATIVE

## 2021-08-28 MED ORDER — LIDOCAINE HCL (PF) 1 % IJ SOLN: 5.0000 mL | INTRAMUSCULAR | Status: DC | PRN

## 2021-08-28 MED ORDER — HYDRALAZINE HCL 50 MG PO TABS
50.0000 mg | ORAL_TABLET | Freq: Three times a day (TID) | ORAL | Status: DC
  Administered 2021-08-28 – 2021-08-29 (×2): 50 mg via ORAL
  Filled 2021-08-28 (×2): qty 1

## 2021-08-28 MED ORDER — ACETAMINOPHEN 650 MG RE SUPP: 650.0000 mg | Freq: Four times a day (QID) | RECTAL | Status: DC | PRN

## 2021-08-28 MED ORDER — ACETAMINOPHEN 325 MG PO TABS: 650.0000 mg | ORAL_TABLET | Freq: Four times a day (QID) | ORAL | Status: DC | PRN

## 2021-08-28 MED ORDER — HEPARIN SODIUM (PORCINE) 1000 UNIT/ML DIALYSIS: 1000.0000 [IU] | INTRAMUSCULAR | Status: DC | PRN

## 2021-08-28 MED ORDER — ASPIRIN EC 81 MG PO TBEC
81.0000 mg | DELAYED_RELEASE_TABLET | Freq: Every day | ORAL | Status: DC
  Administered 2021-08-29: 81 mg via ORAL
  Filled 2021-08-28: qty 1

## 2021-08-28 MED ORDER — NITROGLYCERIN 0.4 MG SL SUBL: 0.4000 mg | SUBLINGUAL_TABLET | SUBLINGUAL | Status: DC | PRN

## 2021-08-28 MED ORDER — CINACALCET HCL 30 MG PO TABS
60.0000 mg | ORAL_TABLET | Freq: Every day | ORAL | Status: DC
  Administered 2021-08-28: 60 mg via ORAL
  Filled 2021-08-28 (×3): qty 2

## 2021-08-28 MED ORDER — CARVEDILOL 12.5 MG PO TABS
12.5000 mg | ORAL_TABLET | Freq: Two times a day (BID) | ORAL | Status: DC
  Administered 2021-08-28 – 2021-08-29 (×2): 12.5 mg via ORAL
  Filled 2021-08-28 (×2): qty 1

## 2021-08-28 MED ORDER — SODIUM CHLORIDE 0.9 % IV SOLN: 100.0000 mL | INTRAVENOUS | Status: DC | PRN

## 2021-08-28 MED ORDER — PENTAFLUOROPROP-TETRAFLUOROETH EX AERO: 1.0000 "application " | INHALATION_SPRAY | CUTANEOUS | Status: DC | PRN

## 2021-08-28 MED ORDER — ONDANSETRON HCL 4 MG/2ML IJ SOLN: 4.0000 mg | Freq: Four times a day (QID) | INTRAMUSCULAR | Status: DC | PRN

## 2021-08-28 MED ORDER — SEVELAMER CARBONATE 800 MG PO TABS: 1600.0000 mg | ORAL_TABLET | Freq: Two times a day (BID) | ORAL | Status: DC | PRN

## 2021-08-28 MED ORDER — ONDANSETRON HCL 4 MG PO TABS: 4.0000 mg | ORAL_TABLET | Freq: Four times a day (QID) | ORAL | Status: DC | PRN

## 2021-08-28 MED ORDER — LATANOPROST 0.005 % OP SOLN
1.0000 [drp] | Freq: Every day | OPHTHALMIC | Status: DC
  Administered 2021-08-28: 1 [drp] via OPHTHALMIC
  Filled 2021-08-28: qty 2.5

## 2021-08-28 MED ORDER — LIDOCAINE-PRILOCAINE 2.5-2.5 % EX CREA: 1.0000 "application " | TOPICAL_CREAM | CUTANEOUS | Status: DC | PRN

## 2021-08-28 MED ORDER — LOSARTAN POTASSIUM 50 MG PO TABS
50.0000 mg | ORAL_TABLET | Freq: Every day | ORAL | Status: DC
  Administered 2021-08-28 – 2021-08-29 (×2): 50 mg via ORAL
  Filled 2021-08-28 (×2): qty 1

## 2021-08-28 MED ORDER — SEVELAMER CARBONATE 800 MG PO TABS
4000.0000 mg | ORAL_TABLET | Freq: Three times a day (TID) | ORAL | Status: DC
  Administered 2021-08-28 – 2021-08-29 (×2): 4000 mg via ORAL
  Filled 2021-08-28 (×2): qty 5

## 2021-08-28 MED ORDER — MACITENTAN 10 MG PO TABS
10.0000 mg | ORAL_TABLET | Freq: Every day | ORAL | Status: DC
  Administered 2021-08-28: 10 mg via ORAL
  Filled 2021-08-28 (×2): qty 1

## 2021-08-28 MED ORDER — ROSUVASTATIN CALCIUM 5 MG PO TABS
10.0000 mg | ORAL_TABLET | Freq: Every day | ORAL | Status: DC
  Administered 2021-08-28: 10 mg via ORAL
  Filled 2021-08-28: qty 2
  Filled 2021-08-28: qty 1

## 2021-08-28 MED ORDER — APIXABAN 5 MG PO TABS
5.0000 mg | ORAL_TABLET | Freq: Two times a day (BID) | ORAL | Status: DC
  Administered 2021-08-28 – 2021-08-29 (×2): 5 mg via ORAL
  Filled 2021-08-28 (×2): qty 1

## 2021-08-28 MED ORDER — CHLORHEXIDINE GLUCONATE CLOTH 2 % EX PADS
6.0000 | MEDICATED_PAD | Freq: Every day | CUTANEOUS | Status: DC
  Administered 2021-08-29: 6 via TOPICAL

## 2021-08-28 MED ORDER — SERTRALINE HCL 25 MG PO TABS
25.0000 mg | ORAL_TABLET | Freq: Every day | ORAL | Status: DC
  Administered 2021-08-28 – 2021-08-29 (×2): 25 mg via ORAL
  Filled 2021-08-28 (×2): qty 1

## 2021-08-28 MED ORDER — MACITENTAN 10 MG PO TABS
10.0000 mg | ORAL_TABLET | Freq: Every day | ORAL | Status: DC
  Filled 2021-08-28: qty 1

## 2021-08-28 MED ORDER — SILDENAFIL CITRATE 20 MG PO TABS
40.0000 mg | ORAL_TABLET | Freq: Three times a day (TID) | ORAL | Status: DC
  Administered 2021-08-28 – 2021-08-29 (×2): 40 mg via ORAL
  Filled 2021-08-28 (×2): qty 2

## 2021-08-28 NOTE — ED Notes (Signed)
Pt received breakfast tray, husband at bedside given happy meal and snacks.

## 2021-08-28 NOTE — ED Notes (Signed)
Elevated  tro called from the lab 101

## 2021-08-28 NOTE — ED Provider Notes (Signed)
[facility] EMERGENCY DEPARTMENT Provider Note   CSN: [id number] Arrival date & time: 08/28/21  0038     History Chief Complaint  Patient presents with   Chest Pain    Jody Nguyen is a 65 y.o. female.  HPI     This is a 65 year old female with a history of end-stage renal disease on dialysis Monday, Wednesday, Friday, diabetes, hypertension, hyperlipidemia who presents with chest discomfort.  Patient reports symptoms on and off over the last 1 to 2 weeks.  They tend to be worse at night.  She cannot tell whether they are worse when she lies flat or exerts herself.  She has noted some shortness of breath.  She is on Eliquis for mechanical valve.  Patient reports that she last dialyzed on Friday and had a full dialysis session.  She is currently symptom-free.  Patient had valve replacement 2016.  Preoperatively she had a cardiac catheterization that showed pulmonary hypertension.  She had nonobstructive coronary artery disease with a 30% lesion.  Past Medical History:  Diagnosis Date   Anemia    Chronic diastolic CHF (congestive heart failure) (HCC)    Chronic diastolic heart failure (HCC)    CKD (chronic kidney disease) stage 4, GFR 15-29 ml/min (HCC)    dialysis M/W/F   Constipation    CVA (cerebral infarction) 1997   no residual deficit   Diverticulitis    DM (diabetes mellitus) (HCC)    type 2   Heart murmur    History of cardiovascular stress test    a. Myoview Oct 2012 showed EF 49%, no ischemia, LVE   HLD (hyperlipidemia)    takes Crestor daily   Hypertension    Pericardial effusion    chronic; felt to be poss related to minoxidil >> DC'd   Pulmonary hypertension ([place]) 06/18/2015   Respiratory failure, acute hypoxic, post-operative 07/08/2015   Requiring ECMO support   S/P cardiac catheterization    a. R/L [facility] 06/18/15:  mLAD 30%; severe pulmo HTN with PA sat 43%, CI 1.86, prominent V waves indicative of MR; resting hypoxemia O2 sat 86% on RA    S/P minimally invasive mitral valve repair 07/08/2015   Complex valvuloplasty including artificial Gore-tex neochord placement x10 and 26 mm Sorin Memo 3D Rechord ring annuloplasty via right mini thoracotomy approach   Severe mitral regurgitation    Shortness of breath dyspnea    Stroke ([place]) 1997   no residual effect   Vitamin D deficiency    Wears glasses     Patient Active Problem List   Diagnosis Date Noted   Patellofemoral arthritis of left knee 04/21/2021   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   Secondary renal hyperparathyroidism ([place]) 07/28/2019   Major depression in remission ([place]) 01/22/2019   ESRD on dialysis ([place]) 10/09/2015   Cardiomyopathy ([place]) 09/10/2015   Atrial fibrillation ([place]) 09/10/2015   S/P minimally invasive mitral valve repair 07/08/2015   Chronic periodontitis 06/30/2015   Chronic diastolic heart failure ([place])    Pulmonary hypertension ([place]) 06/18/2015   Obesity (BMI 30.0-34.9) 03/29/2015   Type 2 diabetes mellitus ([place]) 11/22/2014   Vitamin D deficiency 03/25/2014   Anemia in chronic kidney disease    Essential hypertension 08/17/2011   Hyperlipidemia associated with type 2 diabetes mellitus ([place]) 08/17/2011    Past Surgical History:  Procedure Laterality Date   A/V FISTULAGRAM Right 08/16/2017   Procedure: A/V Fistulagram;  Surgeon: Conrad Berlin, MD;  Location: [place] [facility];  Service: Cardiovascular;  Laterality: Right;   AV FISTULA PLACEMENT Left 10/22/2015   Procedure: RADIOCEPHALIC ARTERIOVENOUS (AV) FISTULA CREATION;  Surgeon: Serafina Mitchell, MD;  Location: [place];  Service: Vascular;  Laterality: Left;   AV FISTULA PLACEMENT Right 05/22/2017   Procedure: ARTERIOVENOUS (AV) FISTULA CREATION-RIGHT ARM;  Surgeon: Waynetta Sandy, MD;  Location: [place];  Service: Vascular;  Laterality: Right;   CANNULATION FOR CARDIOPULMONARY BYPASS N/A 07/08/2015   Procedure: CANNULATION FOR ECMO;  Surgeon: Rexene Alberts, MD;  Location: [place];  Service:  Open Heart Surgery;  Laterality: N/A;   CARDIAC CATHETERIZATION     CARDIAC CATHETERIZATION N/A 06/18/2015   Procedure: Right/Left Heart Cath and Coronary Angiography;  Surgeon: Jettie Booze, MD;  Location: [place] [facility];  Service: Cardiovascular;  Laterality: N/A;   CESAREAN SECTION     x 2   COLONOSCOPY     EXCHANGE OF A DIALYSIS CATHETER N/A 06/28/2017   Procedure: EXCHANGE OF A DIALYSIS CATHETER;  Surgeon: Waynetta Sandy, MD;  Location: [place];  Service: Vascular;  Laterality: N/A;   FISTULA SUPERFICIALIZATION Left 12/28/2015   Procedure: SUPERFICIALIZATION LEFT RADIOCEPHALIC FISTULA;  Surgeon: Serafina Mitchell, MD;  Location: [place];  Service: Vascular;  Laterality: Left;   FISTULA SUPERFICIALIZATION Right 08/21/2017   Procedure: FISTULA SUPERFICIALIZATION RIGHT ARM;  Surgeon: Waynetta Sandy, MD;  Location: [place];  Service: Vascular;  Laterality: Right;   INSERTION OF DIALYSIS CATHETER Right 04/28/2017   Procedure: INSERTION OF DIALYSIS CATHETER-RIGHT INTERNAL JUGULAR PLACEMENT;  Surgeon: Elam Dutch, MD;  Location: [place];  Service: Vascular;  Laterality: Right;   LIGATION OF ARTERIOVENOUS  FISTULA Left 04/28/2017   Procedure: LIGATION OF ARTERIOVENOUS  FISTULA;  Surgeon: Elam Dutch, MD;  Location: [place];  Service: Vascular;  Laterality: Left;   MITRAL VALVE REPAIR Right 07/08/2015   Procedure: MINIMALLY INVASIVE MITRAL VALVE REPAIR with a 26 Sorin Memo 3D Rechord;  Surgeon: Rexene Alberts, MD;  Location: [place];  Service: Open Heart Surgery;  Laterality: Right;   MULTIPLE EXTRACTIONS WITH ALVEOLOPLASTY N/A 06/30/2015   Procedure: MULTIPLE EXTRACTIONS OF TOOTH #'S 4  AND 30 WITH ALVEOLOPLASTY AND GROSS DEBRIDEMENT  OF REMAINING TEETH;  Surgeon: Lenn Cal, DDS;  Location: [place];  Service: Oral Surgery;  Laterality: N/A;   PERIPHERAL VASCULAR CATHETERIZATION Left 03/28/2016   Procedure: A/V Fistulagram;  Surgeon: Serafina Mitchell, MD;  Location: [place] [facility];  Service: Cardiovascular;  Laterality: Left;  lower arm   TEE WITHOUT CARDIOVERSION N/A 06/08/2015   Procedure: TRANSESOPHAGEAL ECHOCARDIOGRAM (TEE);  Surgeon: Lelon Perla, MD;  Location: [facility];  Service: Cardiovascular;  Laterality: N/A;   TEE WITHOUT CARDIOVERSION N/A 07/08/2015   Procedure: TRANSESOPHAGEAL ECHOCARDIOGRAM (TEE);  Surgeon: Rexene Alberts, MD;  Location: [place];  Service: Open Heart Surgery;  Laterality: N/A;     OB History     Gravida  3   Para  3   Term  3   Preterm      AB      Living         SAB      IAB      Ectopic      Multiple      Live Births              Family History  Problem Relation Age of Onset   Cancer Sister        breast cancer   Stroke Mother  Heart attack Brother        MI in his 26s   Heart disease Brother        before age 49   Breast cancer Sister     Social History   Tobacco Use   Smoking status: Never   Smokeless tobacco: Never  Vaping Use   Vaping Use: Never used  Substance Use Topics   Alcohol use: No    Alcohol/week: 0.0 standard drinks   Drug use: No    Home Medications Prior to Admission medications   Medication Sig Start Date End Date Taking? Authorizing Provider  acetaminophen (TYLENOL) 500 MG tablet Take 1,000 mg by mouth every 6 (six) hours as needed (for pain.).   Yes [provider]  apixaban (ELIQUIS) 5 MG TABS tablet Take 5 mg by mouth 2 (two) times daily.   Yes [provider]  aspirin EC 81 MG tablet Take 81 mg by mouth daily.   Yes [provider]  azelastine (ASTELIN) 0.1 % nasal spray Use 1 to 2 sprays each nostril 2 to 3 x / day Patient taking differently: Place 1 spray into both nostrils 2 (two) times daily as needed for rhinitis. 03/26/18  Yes Unk Pinto, MD  carvedilol (COREG) 12.5 MG tablet Take 12.5 mg by mouth in the morning and at bedtime. 04/12/21  Yes [provider]  cinacalcet (SENSIPAR) 60 MG tablet Take 1  tablet (60 mg total) by mouth daily. 09/11/17  Yes Unk Pinto, MD  hydrALAZINE (APRESOLINE) 50 MG tablet Take 1 tablet (50 mg total) by mouth 3 (three) times daily. FOR BLOOD  PRESSURE AND HEART Patient taking differently: Take 50 mg by mouth 3 (three) times daily. 11/09/20  Yes Lelon Perla, MD  latanoprost (XALATAN) 0.005 % ophthalmic solution Place 1 drop into both eyes at bedtime. 09/16/18  Yes [provider]  losartan (COZAAR) 50 MG tablet Take 1 tablet Daily for BP Patient taking differently: Take 50 mg by mouth in the morning and at bedtime. 03/19/20  Yes Unk Pinto, MD  Multiple Vitamins-Minerals (ZINC PO) Take 1 tablet by mouth daily.   Yes [provider]  nitroGLYCERIN (NITROSTAT) 0.4 MG SL tablet Place 0.4 mg under the tongue every 5 (five) minutes as needed for chest pain. 08/24/21  Yes [provider]  ondansetron (ZOFRAN ODT) 4 MG disintegrating tablet Take 1 tablet (4 mg total) by mouth every 8 (eight) hours as needed for nausea or vomiting. 02/23/19  Yes Fawze, Mina A, PA-C  RENVELA 800 MG tablet Take 1,600-4,000 mg by mouth See admin instructions. 4000 mg (5 caps) with meals and 1600 mg (2 caps) with snacks 12/21/15  Yes [provider]  rosuvastatin (CRESTOR) 20 MG tablet Take 1 tablet Daily for Cholesterol Patient taking differently: Take 20 mg by mouth at bedtime. 03/19/20  Yes Unk Pinto, MD  sertraline (ZOLOFT) 25 MG tablet Take 1 tablet Daily for Mood Patient taking differently: Take 25 mg by mouth daily. 03/19/20  Yes Unk Pinto, MD  sildenafil (REVATIO) 20 MG tablet Take 2 tablets (40 mg) 3 x  /day fr Pulmonary Hypertension Patient taking differently: Take 40 mg by mouth 3 (three) times daily. 03/26/20  Yes Unk Pinto, MD  carvedilol (COREG) 25 MG tablet Take 1 tablet 2 x /day for BP & Heart Patient not taking: Reported on 08/28/2021 05/20/20   Unk Pinto, MD    Allergies    Minoxidil, Lipitor  [atorvastatin], Morphine and related, and Ace inhibitors  Review of Systems   Review of Systems  Constitutional:  Negative for fever.  Respiratory:  Positive for chest tightness and shortness of breath.   Cardiovascular:  Positive for chest pain.  Gastrointestinal:  Negative for abdominal pain, nausea and vomiting.  Genitourinary:  Negative for dysuria.  All other systems reviewed and are negative.  Physical Exam Updated Vital Signs BP 129/82   Pulse 89   Temp 99 F (37.2 C) (Oral)   Resp 20   Ht 1.499 m ('4\' 11"'$ )   Wt 78 kg   SpO2 94%   BMI 34.73 kg/m   Physical Exam Vitals and nursing note reviewed.  Constitutional:      Appearance: She is well-developed.  HENT:     Head: Normocephalic and atraumatic.  Eyes:     Pupils: Pupils are equal, round, and reactive to light.  Cardiovascular:     Rate and Rhythm: Normal rate and regular rhythm.     Heart sounds: Murmur heard.  Pulmonary:     Effort: Pulmonary effort is normal. No respiratory distress.     Breath sounds: No wheezing.  Abdominal:     General: Bowel sounds are normal.     Palpations: Abdomen is soft.  Musculoskeletal:     Cervical back: Neck supple.     Right lower leg: No tenderness. No edema.     Left lower leg: No tenderness. No edema.  Skin:    General: Skin is warm and dry.  Neurological:     Mental Status: She is alert and oriented to person, place, and time.  Psychiatric:        Mood and Affect: Mood normal.    ED Results / Procedures / Treatments   Labs (all labs ordered are listed, but only abnormal results are displayed) Labs Reviewed  BASIC METABOLIC PANEL - Abnormal; Notable for the following components:      Result Value   Chloride 94 (*)    Glucose, Bld 108 (*)    BUN 42 (*)    Creatinine, Ser 7.85 (*)    GFR, Estimated 5 (*)    All other components within normal limits  CBC - Abnormal; Notable for the following components:   RBC 3.76 (*)    Hemoglobin 10.4 (*)    HCT 33.1 (*)     RDW 16.9 (*)    All other components within normal limits  TROPONIN I (HIGH SENSITIVITY) - Abnormal; Notable for the following components:   Troponin I (High Sensitivity) 101 (*)    All other components within normal limits  TROPONIN I (HIGH SENSITIVITY) - Abnormal; Notable for the following components:   Troponin I (High Sensitivity) 104 (*)    All other components within normal limits  PROTIME-INR  CBG MONITORING, ED    EKG EKG Interpretation  Date/Time:  Sunday August 28 2021 01:14:20 EDT Ventricular Rate:  92 PR Interval:  236 QRS Duration: 112 QT Interval:  394 QTC Calculation: 487 R Axis:   67 Text Interpretation: Sinus rhythm with 1st degree A-V block ST & T wave abnormality, consider lateral ischemia Prolonged QT Abnormal ECG No significant change since last tracing Confirmed by Thayer Jew [id number]) on 08/28/2021 6:14:37 AM  Radiology DG Chest 2 View  Result Date: 08/28/2021 CLINICAL DATA:  Central chest pain. EXAM: CHEST - 2 VIEW COMPARISON:  February 23, 2019 FINDINGS: Mild, diffuse, chronic appearing increased lung markings are seen. Mild atelectatic changes are seen along the infrahilar regions, bilaterally. There is no evidence of  a pleural effusion or pneumothorax. The cardiac silhouette is enlarged and unchanged in size. Radiopaque surgical clips are seen along the suprahilar and infrahilar aspects of the superior mediastinum on the right. The visualized skeletal structures are unremarkable. IMPRESSION: Stable cardiomegaly with mild bilateral infrahilar atelectasis. Electronically Signed   By: Virgina Norfolk M.D.   On: 08/28/2021 01:54    Procedures Procedures   Medications Ordered in ED Medications - No data to display  ED Course  I have reviewed the triage vital signs and the nursing notes.  Pertinent labs & imaging results that were available during my care of the patient were reviewed by me and considered in my medical decision making (see chart for  details).  Clinical Course as of 08/28/21 [id number]  Nancy Fetter Aug 28, 2021  0642 Spoke to cardiology fellow, Marcelle Smiling.  Recommends admission to the hospitalist service for serial troponins and potential functional study.  Hard to decipher whether troponins are truly reflective of coronary artery disease.  No ischemic evaluation greater than 5 years.  [CH]    Clinical Course User Index [CH] Shearon Clonch, Barbette Hair, MD   MDM Rules/Calculators/A&P                           Patient presents with chest pain.  Somewhat atypical in nature; however she has significant risk factors with heart score of 4.  EKG shows no evidence of acute ischemic or arrhythmic changes and is unchanged from prior.  Initial troponin 101.  Repeat troponin 104.  Unclear whether this is related to her known renal disease.  Unknown prior baseline.  She did have some nonocclusive heart disease on her prior catheterization but that was over 5 years ago.  She certainly has risk factors.  Other considerations include GI etiology although there is no food component.  Less likely PE as she reports compliance with her blood thinners.  Discussed patient briefly with cardiology who recommends admission for functional study given unclear baseline troponin and remote testing.  Final Clinical Impression(s) / ED Diagnoses Final diagnoses:  Precordial pain  Elevated troponin    Rx / DC Orders ED Discharge Orders     None        Janisa Labus, Barbette Hair, MD 08/28/21 0710

## 2021-08-28 NOTE — Progress Notes (Signed)
Brief nephrology note  65 year old female with history of ESRD MWF dialysis at [facility] presents with atypical chest pain.  Patient is being admitted as observation with plans for cardiology evaluation.  Will order routine dialysis tomorrow.  We will see more formally if the patient remains in the hospital longer than expected.

## 2021-08-28 NOTE — ED Notes (Signed)
Patient is not currently in room 17.

## 2021-08-28 NOTE — Progress Notes (Signed)
  Echocardiogram 2D Echocardiogram has been performed.  Jody Nguyen 08/28/2021, 3:57 PM

## 2021-08-28 NOTE — ED Provider Notes (Signed)
Emergency Medicine Provider Triage Evaluation Note  Jody Nguyen , a 65 y.o. female  was evaluated in triage.  Hx of ESRD on M/W/F dialysis, last dialyzed yesterday for a full session.  Pt complains of central sharp, stabbing pain which has been intermittent x 2 weeks. Worse at night when lying flat. Sometimes associated with SOB. Reports compliance with Eliquis. No fevers, weight gain, leg swelling. Denies hx of CAD or PCI.  Review of Systems  Positive: Chest pain Negative: Fever, leg swelling  Physical Exam  BP 137/84 (BP Location: Left Arm)   Pulse 91   Temp 98.4 F (36.9 C) (Oral)   Resp 18   Ht '4\' 11"'$  (1.499 m)   Wt 78 kg   SpO2 92%   BMI 34.73 kg/m  Gen:   Awake, no distress   Resp:  Normal effort  MSK:   Moves extremities without difficulty  Other:  Lungs CTAB. Heart RRR.  Medical Decision Making  Medically screening exam initiated at 1:09 AM.  Appropriate orders placed.  Jody Nguyen was informed that the remainder of the evaluation will be completed by another provider, this initial triage assessment does not replace that evaluation, and the importance of remaining in the ED until their evaluation is complete.  Chest pain    Antonietta Breach, PA-C 08/28/21 0111    Merryl Hacker, MD 08/29/21 [phone]

## 2021-08-28 NOTE — ED Notes (Signed)
Called service response to order breakfast tray.

## 2021-08-28 NOTE — H&P (Addendum)
History and Physical    Jody Nguyen [id number] DOB: 1956/09/01 DOA: 08/28/2021  PCP: Unk Pinto, MD Patient coming from: home  Chief Complaint: chest pain  HPI: Jody Nguyen is a 65 y.o. female with medical history significant of prior mitral valve repair, diastolic heart failure, ESRD on HD MWF, CVA, T2DM, HTN, HLD, severe pulmonary hypertension, previous PE on Eliquis, who presented with chest pain.  Patient had a mitral valve repair in August 2016, and her course was complicated by acute hypoxic respiratory failure refractory to conventional ventilatory support.  Patient was transferred to [facility] for ECMO.  The course was complicated by need for tracheostomy and she also developed acute on chronic renal failure and now requires hemodialysis. Patient had LHC in September 2016 which showed nonobstructive CAD and severely elevated pulmonary pressures. NM study in January 2020 showed no ischemia.  Patient being followed by cardiologist Dr. Stanford Breed as outpatient.  Patient reports intermittent chest pain over last 2 weeks.  Her chest pain was located to midsternal chest area, aching/sharp pain, moderate pain, radiating to bilateral shoulders and lasting for 10-15 minutes.  Her chest pain worsened with exertion at times, and seemed to be worse at nights, and resolved at rest.  Associate symptoms included occasional shortness of breath and dyspnea exertion.  Denies PND or orthopnea.  Denies long distance travel or leg swelling.  Denies fevers, chills, cough, wheezing, nausea, vomiting, diarrhea, abdominal pain, dysuria, urinary frequency or urgency.  Patient called her cardiologist office a couple of times and was instructed to go to the ED. In the emergency room, patient is afebrile with pulse 91, RR 18, BP 137/87 and room air O2 sats 92 - 95%.  Labs showed elevated troponin 101, 104 nonrevealing CBC, baseline renal function. 1st EKG showed LVH with repolarization abnormality. 2nd EKG  showed NSR with ST-T changes in lateral leads.  Patient is currently chest pain-free      Review of Systems: As per HPI otherwise 10 point review of systems negative.  Review of Systems Otherwise negative except as per HPI, including: General: Denies fever, chills, night sweats or unintended weight loss. Resp: Denies cough, wheezing.positive for shortness of breath and a dyspnea on exertion Cardiac: Denies palpitations, orthopnea, paroxysmal nocturnal dyspnea.  Positive for chest pain GI: Denies abdominal pain, nausea, vomiting, diarrhea or constipation GU: Denies dysuria, frequency, hesitancy or incontinence MS: Denies muscle aches, joint pain or swelling Neuro: Denies headache, neurologic deficits (focal weakness, numbness, tingling), abnormal gait Psych: Denies anxiety, depression, SI/HI/AVH Skin: Denies new rashes or lesions ID: Denies sick contacts, exotic exposures, travel  Past Medical History:  Diagnosis Date   Anemia    Chronic diastolic CHF (congestive heart failure) (HCC)    Chronic diastolic heart failure (HCC)    CKD (chronic kidney disease) stage 4, GFR 15-29 ml/min (HCC)    dialysis M/W/F   Constipation    CVA (cerebral infarction) 1997   no residual deficit   Diverticulitis    DM (diabetes mellitus) (HCC)    type 2   Heart murmur    History of cardiovascular stress test    a. Myoview Oct 2012 showed EF 49%, no ischemia, LVE   HLD (hyperlipidemia)    takes Crestor daily   Hypertension    Pericardial effusion    chronic; felt to be poss related to minoxidil >> DC'd   Pulmonary hypertension ([place]) 06/18/2015   Respiratory failure, acute hypoxic, post-operative 07/08/2015   Requiring ECMO support   S/P cardiac catheterization  a. R/L HC 06/18/15:  mLAD 30%; severe pulmo HTN with PA sat 43%, CI 1.86, prominent V waves indicative of MR; resting hypoxemia O2 sat 86% on RA   S/P minimally invasive mitral valve repair 07/08/2015   Complex valvuloplasty including  artificial Gore-tex neochord placement x10 and 26 mm Sorin Memo 3D Rechord ring annuloplasty via right mini thoracotomy approach   Severe mitral regurgitation    Shortness of breath dyspnea    Stroke ([place]) 1997   no residual effect   Vitamin D deficiency    Wears glasses     Past Surgical History:  Procedure Laterality Date   A/V FISTULAGRAM Right 08/16/2017   Procedure: A/V Fistulagram;  Surgeon: Conrad Newburg, MD;  Location: [place] [facility];  Service: Cardiovascular;  Laterality: Right;   AV FISTULA PLACEMENT Left 10/22/2015   Procedure: RADIOCEPHALIC ARTERIOVENOUS (AV) FISTULA CREATION;  Surgeon: Serafina Mitchell, MD;  Location: [place] OR;  Service: Vascular;  Laterality: Left;   AV FISTULA PLACEMENT Right 05/22/2017   Procedure: ARTERIOVENOUS (AV) FISTULA CREATION-RIGHT ARM;  Surgeon: Waynetta Sandy, MD;  Location: [place];  Service: Vascular;  Laterality: Right;   CANNULATION FOR CARDIOPULMONARY BYPASS N/A 07/08/2015   Procedure: CANNULATION FOR ECMO;  Surgeon: Rexene Alberts, MD;  Location: [place];  Service: Open Heart Surgery;  Laterality: N/A;   CARDIAC CATHETERIZATION     CARDIAC CATHETERIZATION N/A 06/18/2015   Procedure: Right/Left Heart Cath and Coronary Angiography;  Surgeon: Jettie Booze, MD;  Location: [place] [facility];  Service: Cardiovascular;  Laterality: N/A;   CESAREAN SECTION     x 2   COLONOSCOPY     EXCHANGE OF A DIALYSIS CATHETER N/A 06/28/2017   Procedure: EXCHANGE OF A DIALYSIS CATHETER;  Surgeon: Waynetta Sandy, MD;  Location: [place];  Service: Vascular;  Laterality: N/A;   FISTULA SUPERFICIALIZATION Left 12/28/2015   Procedure: SUPERFICIALIZATION LEFT RADIOCEPHALIC FISTULA;  Surgeon: Serafina Mitchell, MD;  Location: [place];  Service: Vascular;  Laterality: Left;   FISTULA SUPERFICIALIZATION Right 08/21/2017   Procedure: FISTULA SUPERFICIALIZATION RIGHT ARM;  Surgeon: Waynetta Sandy, MD;  Location: [place];  Service: Vascular;   Laterality: Right;   INSERTION OF DIALYSIS CATHETER Right 04/28/2017   Procedure: INSERTION OF DIALYSIS CATHETER-RIGHT INTERNAL JUGULAR PLACEMENT;  Surgeon: Elam Dutch, MD;  Location: [place];  Service: Vascular;  Laterality: Right;   LIGATION OF ARTERIOVENOUS  FISTULA Left 04/28/2017   Procedure: LIGATION OF ARTERIOVENOUS  FISTULA;  Surgeon: Elam Dutch, MD;  Location: [place];  Service: Vascular;  Laterality: Left;   MITRAL VALVE REPAIR Right 07/08/2015   Procedure: MINIMALLY INVASIVE MITRAL VALVE REPAIR with a 26 Sorin Memo 3D Rechord;  Surgeon: Rexene Alberts, MD;  Location: [place];  Service: Open Heart Surgery;  Laterality: Right;   MULTIPLE EXTRACTIONS WITH ALVEOLOPLASTY N/A 06/30/2015   Procedure: MULTIPLE EXTRACTIONS OF TOOTH #'S 4  AND 30 WITH ALVEOLOPLASTY AND GROSS DEBRIDEMENT  OF REMAINING TEETH;  Surgeon: Lenn Cal, DDS;  Location: [place];  Service: Oral Surgery;  Laterality: N/A;   PERIPHERAL VASCULAR CATHETERIZATION Left 03/28/2016   Procedure: A/V Fistulagram;  Surgeon: Serafina Mitchell, MD;  Location: [place] [facility];  Service: Cardiovascular;  Laterality: Left;  lower arm   TEE WITHOUT CARDIOVERSION N/A 06/08/2015   Procedure: TRANSESOPHAGEAL ECHOCARDIOGRAM (TEE);  Surgeon: Lelon Perla, MD;  Location: [facility] END[facility];  Service: Cardiovascular;  Laterality: N/A;   TEE WITHOUT CARDIOVERSION N/A 07/08/2015  Procedure: TRANSESOPHAGEAL ECHOCARDIOGRAM (TEE);  Surgeon: Rexene Alberts, MD;  Location: [place];  Service: Open Heart Surgery;  Laterality: N/A;    SOCIAL HISTORY:  reports that she has never smoked. She has never used smokeless tobacco. She reports that she does not drink alcohol and does not use drugs.  Allergies  Allergen Reactions   Minoxidil Other (See Comments)    Pericardial effusion   Lipitor [Atorvastatin] Other (See Comments)    MYALGIAS > "pain in legs" Tolerates rosuvastatin   Morphine And Related Itching   Ace Inhibitors Other (See Comments)     REACTION: "not sure...think it made me drowsy all the time"    FAMILY HISTORY: Family History  Problem Relation Age of Onset   Cancer Sister        breast cancer   Stroke Mother    Heart attack Brother        MI in his 60s   Heart disease Brother        before age 60   Breast cancer Sister      Prior to Admission medications   Medication Sig Start Date End Date Taking? Authorizing Provider  acetaminophen (TYLENOL) 500 MG tablet Take 1,000 mg by mouth every 6 (six) hours as needed (for pain.).   Yes [provider]  apixaban (ELIQUIS) 5 MG TABS tablet Take 5 mg by mouth 2 (two) times daily.   Yes [provider]  aspirin EC 81 MG tablet Take 81 mg by mouth daily.   Yes [provider]  azelastine (ASTELIN) 0.1 % nasal spray Use 1 to 2 sprays each nostril 2 to 3 x / day Patient taking differently: Place 1 spray into both nostrils 2 (two) times daily as needed for rhinitis. 03/26/18  Yes Unk Pinto, MD  carvedilol (COREG) 12.5 MG tablet Take 12.5 mg by mouth in the morning and at bedtime. 04/12/21  Yes [provider]  cinacalcet (SENSIPAR) 60 MG tablet Take 1 tablet (60 mg total) by mouth daily. 09/11/17  Yes Unk Pinto, MD  hydrALAZINE (APRESOLINE) 50 MG tablet Take 1 tablet (50 mg total) by mouth 3 (three) times daily. FOR BLOOD  PRESSURE AND HEART Patient taking differently: Take 50 mg by mouth 3 (three) times daily. 11/09/20  Yes Lelon Perla, MD  latanoprost (XALATAN) 0.005 % ophthalmic solution Place 1 drop into both eyes at bedtime. 09/16/18  Yes [provider]  losartan (COZAAR) 50 MG tablet Take 1 tablet Daily for BP Patient taking differently: Take 50 mg by mouth in the morning and at bedtime. 03/19/20  Yes Unk Pinto, MD  Multiple Vitamins-Minerals (ZINC PO) Take 1 tablet by mouth daily.   Yes [provider]  nitroGLYCERIN (NITROSTAT) 0.4 MG SL tablet Place 0.4 mg under the tongue every 5 (five)  minutes as needed for chest pain. 08/24/21  Yes [provider]  ondansetron (ZOFRAN ODT) 4 MG disintegrating tablet Take 1 tablet (4 mg total) by mouth every 8 (eight) hours as needed for nausea or vomiting. 02/23/19  Yes Fawze, Mina A, PA-C  RENVELA 800 MG tablet Take 1,600-4,000 mg by mouth See admin instructions. 4000 mg (5 caps) with meals and 1600 mg (2 caps) with snacks 12/21/15  Yes [provider]  rosuvastatin (CRESTOR) 20 MG tablet Take 1 tablet Daily for Cholesterol Patient taking differently: Take 20 mg by mouth at bedtime. 03/19/20  Yes Unk Pinto, MD  sertraline (ZOLOFT) 25 MG tablet Take 1 tablet Daily for Mood Patient  taking differently: Take 25 mg by mouth daily. 03/19/20  Yes Unk Pinto, MD  sildenafil (REVATIO) 20 MG tablet Take 2 tablets (40 mg) 3 x  /day fr Pulmonary Hypertension Patient taking differently: Take 40 mg by mouth 3 (three) times daily. 03/26/20  Yes Unk Pinto, MD  carvedilol (COREG) 25 MG tablet Take 1 tablet 2 x /day for BP & Heart Patient not taking: Reported on 08/28/2021 05/20/20   Unk Pinto, MD    Physical Exam: Vitals:   08/28/21 0757 08/28/21 0800 08/28/21 1000 08/28/21 1002  BP: 140/86 131/84    Pulse: 88 89 87   Resp: 18 (!) 22 (!) 21   Temp:      TempSrc:      SpO2: 94% 93% (!) 88% 93%  Weight:      Height:          Constitutional: NAD, calm, comfortable.  Chronic ill-appearing. Eyes: PERRL, lids and conjunctivae normal ENMT: Mucous membranes are moist. Posterior pharynx clear of any exudate or lesions.Normal dentition.  Neck: normal, supple, no masses, no thyromegaly Respiratory: clear to auscultation bilaterally, no wheezing, no crackles. Normal respiratory effort. No accessory muscle use.  Positive for mediastinal chest wall area reproduciable chest aching Cardiovascular: Regular rate and rhythm, no murmurs / rubs / gallops. No extremity edema. 2+ pedal pulses. No carotid bruits.  Abdomen: no  tenderness, no masses palpated. No hepatosplenomegaly. Bowel sounds positive.  Musculoskeletal: no clubbing / cyanosis. No joint deformity upper and lower extremities. Good ROM, no contractures. Normal muscle tone.  Skin: no rashes, lesions, ulcers. No induration Neurologic: CN 2-12 grossly intact. Sensation intact, DTR normal. Strength 5/5 in all 4.  Psychiatric: Normal judgment and insight. Alert and oriented x 3. Normal mood.     Labs on Admission: I have personally reviewed following labs and imaging studies  CBC: Recent Labs  Lab 08/28/21 0118  WBC 5.1  HGB 10.4*  HCT 33.1*  MCV 88.0  PLT 123XX123   Basic Metabolic Panel: Recent Labs  Lab 08/28/21 0118  Doyal Saric 138  K 4.1  CL 94*  CO2 29  GLUCOSE 108*  BUN 42*  CREATININE 7.85*  CALCIUM 9.5   GFR: Estimated Creatinine Clearance: 6.4 mL/min (A) (by C-G formula based on SCr of 7.85 mg/dL (H)). Liver Function Tests: No results for input(s): AST, ALT, ALKPHOS, BILITOT, PROT, ALBUMIN in the last 168 hours. No results for input(s): LIPASE, AMYLASE in the last 168 hours. No results for input(s): AMMONIA in the last 168 hours. Coagulation Profile: Recent Labs  Lab 08/28/21 0118  INR 1.1   Cardiac Enzymes: No results for input(s): CKTOTAL, CKMB, CKMBINDEX, TROPONINI in the last 168 hours. BNP (last 3 results) No results for input(s): PROBNP in the last 8760 hours. HbA1C: No results for input(s): HGBA1C in the last 72 hours. CBG: Recent Labs  Lab 08/28/21 0604  GLUCAP 85   Lipid Profile: No results for input(s): CHOL, HDL, LDLCALC, TRIG, CHOLHDL, LDLDIRECT in the last 72 hours. Thyroid Function Tests: No results for input(s): TSH, T4TOTAL, FREET4, T3FREE, THYROIDAB in the last 72 hours. Anemia Panel: No results for input(s): VITAMINB12, FOLATE, FERRITIN, TIBC, IRON, RETICCTPCT in the last 72 hours. Urine analysis:    Component Value Date/Time   COLORURINE YELLOW 12/07/2020 [place] 12/07/2020 1124    LABSPEC 1.014 12/07/2020 1124   PHURINE 8.0 12/07/2020 [place] 12/07/2020 1124   [place] 12/07/2020 1124   [place] 02/23/2019 1606  KETONESUR NEGATIVE 12/07/2020 1124   PROTEINUR 2+ (A) 12/07/2020 1124   UROBILINOGEN 0.2 07/06/2015 1419   NITRITE NEGATIVE 02/23/2019 1606   LEUKOCYTESUR LARGE (A) 02/23/2019 1606   Sepsis Labs: !!!!!!!!!!!!!!!!!!!!!!!!!!!!!!!!!!!!!!!!!!!! '@LABRCNTIP'$ (procalcitonin:4,lacticidven:4) )No results found for this or any previous visit (from the past 240 hour(s)).   Radiological Exams on Admission: DG Chest 2 View  Result Date: 08/28/2021 CLINICAL DATA:  Central chest pain. EXAM: CHEST - 2 VIEW COMPARISON:  February 23, 2019 FINDINGS: Mild, diffuse, chronic appearing increased lung markings are seen. Mild atelectatic changes are seen along the infrahilar regions, bilaterally. There is no evidence of a pleural effusion or pneumothorax. The cardiac silhouette is enlarged and unchanged in size. Radiopaque surgical clips are seen along the suprahilar and infrahilar aspects of the superior mediastinum on the right. The visualized skeletal structures are unremarkable. IMPRESSION: Stable cardiomegaly with mild bilateral infrahilar atelectasis. Electronically Signed   By: Virgina Norfolk M.D.   On: 08/28/2021 01:54     All images have been reviewed by me personally.  EKG: Independently reviewed.   Assessment/Plan Principal Problem:   Chest pain Active Problems:   Essential hypertension   Hyperlipidemia associated with type 2 diabetes mellitus (HCC)   Type 2 diabetes mellitus ([employer])   Obesity (BMI 30.0-34.9)   Pulmonary hypertension (HCC)   Chronic diastolic heart failure (HCC)   S/P minimally invasive mitral valve repair   ESRD on dialysis [facility])   Assessment Plan  # chest pain # prior mitral valve repair #Chronic diastolic heart failure # NM study at [facility] in January 2020 showed no ischemia. # LHC in September 2016 which  showed nonobstructive CAD and severely elevated pulmonary pressures  Patient presented with chest pain with typical and atypical features. She is admitted for chest pain workup.   - cardiac tele - troponin 101--> 104, will continue to trend troponins - EKGs reviewed--1st EKG showed LVH with repolarization abnormality. 2nd EKG showed NSR with ST-T changes in lateral leads.  - will discuss with cardiology about further management-- cardiology recommended echo first and call them if abnormal.   # ESRD on HD MWF  - Nephrology will arrange for routine HD on Monday 08/29/21.    # T2DM # obesity,  Body mass index is 34.73 kg/m.  - Glu AC and HS - Diabetic diet - weight loss per PCP as outpatient  # HTN and HLD  - chronic and stable  #Hx of Severe pulmonary hypertension # Hx of PE on Eliquis   - continue home Eliquis      DVT prophylaxis: Eliquis and SCD Code Status: Full code Family Communication: husband at bedside Consults called: cardiology Admission status: obs  Status is: Observation  The patient remains OBS appropriate and will d/c before 2 midnights.  Dispo: The patient is from: Home              Anticipated d/c is to: Home              Patient currently is medically stable to d/c.   Difficult to place patient No       Time Spent: 65 minutes.  >50% of the time was devoted to discussing the patients care, assessment, plan and disposition with other care givers along with counseling the patient about the risks and benefits of treatment.    Charlann Lange MD [facility]  If 7PM-7AM, please contact night-coverage   08/28/2021, 10:23 AM

## 2021-08-28 NOTE — ED Notes (Signed)
Pt ambulated to restroom. Pt states having no SHOB or dizziness.

## 2021-08-29 DIAGNOSIS — N186 End stage renal disease: Secondary | ICD-10-CM | POA: Diagnosis not present

## 2021-08-29 DIAGNOSIS — I5032 Chronic diastolic (congestive) heart failure: Secondary | ICD-10-CM

## 2021-08-29 DIAGNOSIS — I272 Pulmonary hypertension, unspecified: Secondary | ICD-10-CM

## 2021-08-29 DIAGNOSIS — Z992 Dependence on renal dialysis: Secondary | ICD-10-CM | POA: Diagnosis not present

## 2021-08-29 DIAGNOSIS — R0789 Other chest pain: Secondary | ICD-10-CM | POA: Diagnosis not present

## 2021-08-29 DIAGNOSIS — R0782 Intercostal pain: Secondary | ICD-10-CM

## 2021-08-29 LAB — CBC
HCT: 30.9 % — ABNORMAL LOW (ref 36.0–46.0)
Hemoglobin: 9.7 g/dL — ABNORMAL LOW (ref 12.0–15.0)
MCH: 27 pg (ref 26.0–34.0)
MCHC: 31.4 g/dL (ref 30.0–36.0)
MCV: 86.1 fL (ref 80.0–100.0)
Platelets: 168 10*3/uL (ref 150–400)
RBC: 3.59 MIL/uL — ABNORMAL LOW (ref 3.87–5.11)
RDW: 16.7 % — ABNORMAL HIGH (ref 11.5–15.5)
WBC: 4.5 10*3/uL (ref 4.0–10.5)
nRBC: 0 % (ref 0.0–0.2)

## 2021-08-29 LAB — GLUCOSE, CAPILLARY
Glucose-Capillary: 95 mg/dL (ref 70–99)
Glucose-Capillary: 98 mg/dL (ref 70–99)

## 2021-08-29 LAB — BASIC METABOLIC PANEL
Anion gap: 14 (ref 5–15)
BUN: 56 mg/dL — ABNORMAL HIGH (ref 8–23)
CO2: 31 mmol/L (ref 22–32)
Calcium: 9 mg/dL (ref 8.9–10.3)
Chloride: 90 mmol/L — ABNORMAL LOW (ref 98–111)
Creatinine, Ser: 9.48 mg/dL — ABNORMAL HIGH (ref 0.44–1.00)
GFR, Estimated: 4 mL/min — ABNORMAL LOW (ref >60)
Glucose, Bld: 108 mg/dL — ABNORMAL HIGH (ref 70–99)
Potassium: 3.9 mmol/L (ref 3.5–5.1)
Sodium: 135 mmol/L (ref 135–145)

## 2021-08-29 LAB — HEPATITIS B SURFACE ANTIBODY,QUALITATIVE: Hep B S Ab: REACTIVE — AB

## 2021-08-29 MED ORDER — PANTOPRAZOLE SODIUM 40 MG PO TBEC: 40.0000 mg | DELAYED_RELEASE_TABLET | Freq: Every day | ORAL | 1 refills | Status: AC

## 2021-08-29 NOTE — Procedures (Addendum)
   I was present at this dialysis session, have reviewed the session itself and made  appropriate changes Kelly Splinter MD [place] pager [phone]   08/29/2021, 2:48 PM

## 2021-08-29 NOTE — Progress Notes (Signed)
Pharmacy Consult for Pulmonary Hypertension Treatment   Indication - Continuation of prior to admission medication   Patient is 65 y.o.  with history of PAH on chronic Macitentan (Ops[place]it) PTA and will be continued while hospitalized.   Continuing this medication order as an inpatient requires that monitoring parameters per REMS requirements must be met.  Chronic therapy is under the supervision of Dr. Kathee Delton who is enrolled in the REMS program and is being notified of continuation of therapy. A staff message in EPIC has been sent notifying the certified prescriber.  Per patient report has previously been educated on Pregnancy Risk and Hepatotoxicity . On admission pregnancy risk has been assessed and no monitoring required. Hepatic function has been evaluated. AST/ALT appropriate to continue medication at this time.  Hepatic Function Latest Ref Rng & Units 08/28/2021 04/21/2021 12/07/2020  Total Protein 6.5 - 8.1 g/dL 7.6 8.0 7.5  Albumin 3.5 - 5.0 g/dL 3.7 - -  AST 15 - 41 U/L 11(L) 8(L) 10  ALT 0 - 44 U/L $Remo'11 6 10  'asGlG$ Alk Phosphatase 38 - 126 U/L 76 - -  Total Bilirubin 0.3 - 1.2 mg/dL 0.6 0.4 0.5  Bilirubin, Direct 0.0 - 0.2 mg/dL <0.1 - -    If any question arise or pregnancy is identified during hospitalization, contact for bosentan and macitentan: [phone]; ambrisentan: [phone].  Thank for you allowing [place] to participate in the care of this patient.    Hildred Laser, PharmD Clinical Pharmacist **Pharmacist phone directory can now be found on [email or web address] ([email or web address]).  Listed under [place].

## 2021-08-29 NOTE — Care Management Obs Status (Signed)
[facility] NOTIFICATION   Patient Details  Name: Jody Nguyen MRN: [id number] Date of Birth: [birth year]   Medicare Observation Status Notification Given:  Yes    Zenon Mayo, RN 08/29/2021, 1:44 PM

## 2021-08-29 NOTE — Discharge Summary (Signed)
Physician Discharge Summary  Jody Nguyen [id number] DOB: [birth year] DOA: 08/28/2021  PCP: Unk Pinto, MD  Admit date: 08/28/2021 Discharge date: 08/29/2021  Admitted From: Home Disposition: Home  Recommendations for Outpatient Follow-up:  Follow up with PCP in 1-2 weeks Follow-up with cardiology as scheduled  Home Health: Not applicable Equipment/Devices: Not applicable  Discharge Condition: Stable CODE STATUS: Full code Diet recommendation: Low-salt diet, low-carb diet  Discharge summary:  65 year old with history of mitral valve repair, diastolic heart failure, ESRD on hemodialysis Monday Wednesday Friday, history of stroke, type 2 diabetes, hypertension, severe pulmonary hypertension, pulmonary embolism on Eliquis as well as on aspirin presented to the ER with intermittent midsternal chest pain for 2 weeks mostly happening at night after laying flat.  Occasional shortness of breath and dyspnea on exertion.  Patient was admitted for observation.  She has evidence of mediastinal chest wall area reproducible chest pain.  Troponins were mildly elevated and remains flat.  EKG with no acute ST-T wave changes.  2D echocardiogram with no new findings.  No regional wall abnormality.  Patient chest pain-free since admission. Today, she received hemodialysis.  She has no new findings.  Atypical chest pain.  Less likely acute coronary syndrome.  Currently chest pain-free.  Admitting physician has documented discussion with cardiology and only needing intervention if any new findings on the echocardiogram.  There is no new findings on echocardiogram.  Will discharge patient home, acute coronary syndrome ruled out.  Will send for outpatient follow-up.  Patient is already on aspirin along with Eliquis.  Will defer to her cardiologist about continuing both aspirin and Eliquis.  She is already optimized on losartan, Coreg, statin.  With multiple medication use, will start patient on  Protonix 40 mg daily for presumed GERD symptoms.     Discharge Diagnoses:  Principal Problem:   Chest pain Active Problems:   Essential hypertension   Hyperlipidemia associated with type 2 diabetes mellitus (HCC)   Type 2 diabetes mellitus ([place])   Obesity (BMI 30.0-34.9)   Pulmonary hypertension (HCC)   Chronic diastolic heart failure (HCC)   S/P minimally invasive mitral valve repair   ESRD on dialysis [facility])    Discharge Instructions  Discharge Instructions     Call MD for:  difficulty breathing, headache or visual disturbances   Complete by: As directed    Call MD for:  persistant dizziness or light-headedness   Complete by: As directed    Call MD for:  persistant nausea and vomiting   Complete by: As directed    Call MD for:  severe uncontrolled pain   Complete by: As directed    Diet - low sodium heart healthy   Complete by: As directed    Increase activity slowly   Complete by: As directed       Allergies as of 08/29/2021       Reactions   Minoxidil Other (See Comments)   Pericardial effusion   Lipitor [atorvastatin] Other (See Comments)   MYALGIAS > "pain in legs" Tolerates rosuvastatin   Morphine And Related Itching   Ace Inhibitors Other (See Comments)   REACTION: "not sure...think it made me drowsy all the time"        Medication List     TAKE these medications    acetaminophen 500 MG tablet Commonly known as: TYLENOL Take 1,000 mg by mouth every 6 (six) hours as needed (for pain.).   apixaban 5 MG Tabs tablet Commonly known as: ELIQUIS Take 5 mg by mouth  2 (two) times daily.   aspirin EC 81 MG tablet Take 81 mg by mouth daily.   azelastine 0.1 % nasal spray Commonly known as: ASTELIN Use 1 to 2 sprays each nostril 2 to 3 x / day What changed:  how much to take how to take this when to take this reasons to take this additional instructions   carvedilol 12.5 MG tablet Commonly known as: COREG Take 12.5 mg by mouth in the morning  and at bedtime.   cinacalcet 60 MG tablet Commonly known as: SENSIPAR Take 1 tablet (60 mg total) by mouth daily.   hydrALAZINE 50 MG tablet Commonly known as: APRESOLINE Take 1 tablet (50 mg total) by mouth 3 (three) times daily. FOR BLOOD  PRESSURE AND HEART What changed: additional instructions   latanoprost 0.005 % ophthalmic solution Commonly known as: XALATAN Place 1 drop into both eyes at bedtime.   losartan 50 MG tablet Commonly known as: COZAAR Take 1 tablet Daily for BP What changed:  how much to take how to take this when to take this additional instructions   nitroGLYCERIN 0.4 MG SL tablet Commonly known as: NITROSTAT Place 0.4 mg under the tongue every 5 (five) minutes as needed for chest pain.   ondansetron 4 MG disintegrating tablet Commonly known as: Zofran ODT Take 1 tablet (4 mg total) by mouth every 8 (eight) hours as needed for nausea or vomiting.   pantoprazole 40 MG tablet Commonly known as: Protonix Take 1 tablet (40 mg total) by mouth daily.   Renvela 800 MG tablet Generic drug: sevelamer carbonate Take 1,600-4,000 mg by mouth See admin instructions. 4000 mg (5 caps) with meals and 1600 mg (2 caps) with snacks   rosuvastatin 20 MG tablet Commonly known as: CRESTOR Take 1 tablet Daily for Cholesterol What changed:  how much to take how to take this when to take this additional instructions   sertraline 25 MG tablet Commonly known as: ZOLOFT Take 1 tablet Daily for Mood What changed:  how much to take how to take this when to take this additional instructions   sildenafil 20 MG tablet Commonly known as: REVATIO Take 2 tablets (40 mg) 3 x  /day fr Pulmonary Hypertension What changed:  how much to take how to take this when to take this additional instructions   ZINC PO Take 1 tablet by mouth daily.        Follow-up Information     Unk Pinto, MD Follow up.   Specialty: Internal Medicine Contact information: [address] [place] [address] [phone]         Lelon Perla, MD .   Specialty: Cardiology Contact information: [address] [place] [address] [phone]                Allergies  Allergen Reactions   Minoxidil Other (See Comments)    Pericardial effusion   Lipitor [Atorvastatin] Other (See Comments)    MYALGIAS > "pain in legs" Tolerates rosuvastatin   Morphine And Related Itching   Ace Inhibitors Other (See Comments)    REACTION: "not sure...think it made me drowsy all the time"    Consultations: Cardiology, curbside by admitting attending   Procedures/Studies: DG Chest 2 View  Result Date: 08/28/2021 CLINICAL DATA:  Central chest pain. EXAM: CHEST - 2 VIEW COMPARISON:  February 23, 2019 FINDINGS: Mild, diffuse, chronic appearing increased lung markings are seen. Mild atelectatic changes are seen along the infrahilar regions, bilaterally.  There is no evidence of a pleural effusion or pneumothorax. The cardiac silhouette is enlarged and unchanged in size. Radiopaque surgical clips are seen along the suprahilar and infrahilar aspects of the superior mediastinum on the right. The visualized skeletal structures are unremarkable. IMPRESSION: Stable cardiomegaly with mild bilateral infrahilar atelectasis. Electronically Signed   By: Virgina Norfolk M.D.   On: 08/28/2021 01:54   ECHOCARDIOGRAM COMPLETE  Result Date: 08/28/2021    ECHOCARDIOGRAM REPORT   Patient Name:   ANNORAH BRENCHLEY Date of Exam: 08/28/2021 Medical Rec #:  [id number]        Height:       59.0 in Accession #:    [id number]       Weight:       172.0 lb Date of Birth:  1956/10/10         BSA:          1.730 m Patient Age:    29 years         BP:           141/89 mmHg Patient Gender: F                HR:           85 bpm. Exam Location:  Inpatient Procedure: 2D Echo, Cardiac Doppler and Color Doppler Indications:    Chest pain  History:        Patient has prior history  of Echocardiogram examinations, most                 recent 08/17/2016. Mitral Valve Disease; Risk Factors:Diabetes,                 Hypertension and Dyslipidemia. S/P MINIMALLY INVASIVE MITRAL                 VALVE REPAIR with a 26 Sorin Memo 3D Rechord. ESRD. Hx CVA and                 pulmonary embolus.                  Mitral Valve: 26 mm Sorin Memo 3D Rechord valve is present in                 the mitral position.  Sonographer:    Clayton Lefort RDCS (AE) Referring Phys: [id number] NA LI IMPRESSIONS  1. Left ventricular ejection fraction, by estimation, is 50 to 55%. The left ventricle has low normal function. The left ventricle has no regional wall motion abnormalities. There is moderate left ventricular hypertrophy. Left ventricular diastolic parameters are indeterminate.  2. Right ventricular systolic function is mildly reduced. The right ventricular size is mildly enlarged. Mildly increased right ventricular wall thickness. There is moderately elevated pulmonary artery systolic pressure. The estimated right ventricular systolic pressure is [id number] mmHg.  3. Left atrial size was mild to moderately dilated.  4. Right atrial size was mildly dilated.  5. The mitral valve has been repaired/replaced. Mild mitral valve regurgitation. The mean mitral valve gradient is 9.0 mmHg. There is a 26 mm Sorin Memo 3D Rechord present in the mitral position. Suggestive of at least moderately stenotic mitral valve.  6. Tricuspid valve regurgitation is moderate.  7. The aortic valve is tricuspid. Aortic valve regurgitation is not visualized. No aortic stenosis is present. Aortic valve mean gradient measures 4.0 mmHg.  8. The inferior vena cava is normal in size with <50% respiratory variability, suggesting right atrial pressure of 8  mmHg. Comparison(s): Prior images unable to be directly viewed. FINDINGS  Left Ventricle: Left ventricular ejection fraction, by estimation, is 50 to 55%. The left ventricle has low normal function. The left  ventricle has no regional wall motion abnormalities. The left ventricular internal cavity size was normal in size. There is moderate left ventricular hypertrophy. Left ventricular diastolic parameters are indeterminate. Right Ventricle: The right ventricular size is mildly enlarged. Mildly increased right ventricular wall thickness. Right ventricular systolic function is mildly reduced. There is moderately elevated pulmonary artery systolic pressure. The tricuspid regurgitant velocity is 3.41 m/s, and with an assumed right atrial pressure of 8 mmHg, the estimated right ventricular systolic pressure is Q000111Q mmHg. Left Atrium: Left atrial size was mild to moderately dilated. Right Atrium: Right atrial size was mildly dilated. Pericardium: There is no evidence of pericardial effusion. Mitral Valve: The mitral valve has been repaired/replaced. Mild mitral valve regurgitation. There is a 26 mm Sorin Memo 3D Rechord present in the mitral position. MV peak gradient, 15.1 mmHg. The mean mitral valve gradient is 9.0 mmHg. Tricuspid Valve: The tricuspid valve is grossly normal. Tricuspid valve regurgitation is moderate. Aortic Valve: The aortic valve is tricuspid. There is moderate aortic valve annular calcification. Aortic valve regurgitation is not visualized. No aortic stenosis is present. Aortic valve mean gradient measures 4.0 mmHg. Aortic valve peak gradient measures 7.0 mmHg. Aortic valve area, by VTI measures 2.45 cm. Pulmonic Valve: The pulmonic valve was grossly normal. Pulmonic valve regurgitation is trivial. Aorta: The aortic root is normal in size and structure. Venous: The inferior vena cava is normal in size with less than 50% respiratory variability, suggesting right atrial pressure of 8 mmHg. IAS/Shunts: No atrial level shunt detected by color flow Doppler.  LEFT VENTRICLE PLAX 2D LVIDd:         4.70 cm LVIDs:         3.20 cm LV PW:         1.60 cm LV IVS:        1.40 cm LVOT diam:     2.10 cm LV SV:          64 LV SV Index:   37 LVOT Area:     3.46 cm  RIGHT VENTRICLE            IVC RV Basal diam:  3.90 cm    IVC diam: 1.80 cm RV Mid diam:    3.80 cm RV S prime:     8.92 cm/s TAPSE (M-mode): 1.3 cm LEFT ATRIUM             Index       RIGHT ATRIUM           Index LA diam:        4.20 cm 2.43 cm/m  RA Area:     20.80 cm LA Vol (A2C):   68.5 ml 39.60 ml/m RA Volume:   55.60 ml  32.14 ml/m LA Vol (A4C):   65.5 ml 37.87 ml/m LA Biplane Vol: 71.3 ml 41.22 ml/m  AORTIC VALVE AV Area (Vmax):    2.49 cm AV Area (Vmean):   2.43 cm AV Area (VTI):     2.45 cm AV Vmax:           132.00 cm/s AV Vmean:          90.100 cm/s AV VTI:            0.260 m AV Peak Grad:      7.0 mmHg  AV Mean Grad:      4.0 mmHg LVOT Vmax:         94.90 cm/s LVOT Vmean:        63.200 cm/s LVOT VTI:          0.184 m LVOT/AV VTI ratio: 0.71  AORTA Ao Root diam: 2.90 cm Ao Asc diam:  3.40 cm MITRAL VALVE             TRICUSPID VALVE MV Area VTI:  1.30 cm   TR Peak grad:   46.5 mmHg MV Peak grad: 15.1 mmHg  TR Vmax:        341.00 cm/s MV Mean grad: 9.0 mmHg MV Vmax:      1.94 m/s   SHUNTS MV Vmean:     141.0 cm/s Systemic VTI:  0.18 m                          Systemic Diam: 2.10 cm Rozann Lesches MD Electronically signed by Rozann Lesches MD Signature Date/Time: 08/28/2021/4:26:28 PM    Final    (Echo, Carotid, EGD, Colonoscopy, ERCP)    Subjective: Patient was seen and examined.  She was receiving hemodialysis.  Denied any pain since admission.   Discharge Exam: Vitals:   08/29/21 1120 08/29/21 1157  BP: 104/66 (!) 91/59  Pulse: 84 87  Resp: 19 14  Temp: 98.5 F (36.9 C) 98.5 F (36.9 C)  SpO2: 98% 92%   Vitals:   08/29/21 1030 08/29/21 1100 08/29/21 1120 08/29/21 1157  BP: 116/69 116/72 104/66 (!) 91/59  Pulse: 86 84 84 87  Resp:   19 14  Temp:   98.5 F (36.9 C) 98.5 F (36.9 C)  TempSrc:   Oral Oral  SpO2:   98% 92%  Weight:   78.5 kg   Height:        General: Pt is alert, awake, not in acute  distress Cardiovascular: RRR, S1/S2 +, no rubs, no gallops Patient has tenderness on palpation to lower sternum. Respiratory: CTA bilaterally, no wheezing, no rhonchi Abdominal: Soft, NT, ND, bowel sounds + Extremities: no edema, no cyanosis    The results of significant diagnostics from this hospitalization (including imaging, microbiology, ancillary and laboratory) are listed below for reference.     Microbiology: Recent Results (from the past 240 hour(s))  SARS CORONAVIRUS 2 (TAT 6-24 HRS) Nasopharyngeal Nasopharyngeal Swab     Status: None   Collection Time: 08/28/21  7:11 AM   Specimen: Nasopharyngeal Swab  Result Value Ref Range Status   SARS Coronavirus 2 NEGATIVE NEGATIVE Final    Comment: (NOTE) SARS-CoV-2 target nucleic acids are NOT DETECTED.  The SARS-CoV-2 RNA is generally detectable in upper and lower respiratory specimens during the acute phase of infection. Negative results do not preclude SARS-CoV-2 infection, do not rule out co-infections with other pathogens, and should not be used as the sole basis for treatment or other patient management decisions. Negative results must be combined with clinical observations, patient history, and epidemiological information. The expected result is Negative.  Fact Sheet for Patients: SugarRoll.be  Fact Sheet for Healthcare Providers: https://www.woods-mathews.com/  This test is not yet approved or cleared by the [place] FDA and  has been authorized for detection and/or diagnosis of SARS-CoV-2 by FDA under an Emergency Use Authorization (EUA). This EUA will remain  in effect (meaning this test can be used) for the duration of the COVID-19 declaration under Se ction 564(b)(1) of the Act,  21 U.S.C. section 360bbb-3(b)(1), unless the authorization is terminated or revoked sooner.  Performed at [facility], [place] [address] [place], [place] [address]   MRSA Next Gen by  PCR, Nasal     Status: None   Collection Time: 08/28/21  6:30 PM   Specimen: Nasal Mucosa; Nasal Swab  Result Value Ref Range Status   MRSA by PCR Next Gen NOT DETECTED NOT DETECTED Final    Comment: (NOTE) The GeneXpert MRSA Assay (FDA approved for NASAL specimens only), is one component of a comprehensive MRSA colonization surveillance program. It is not intended to diagnose MRSA infection nor to guide or monitor treatment for MRSA infections. Test performance is not FDA approved in patients less than 26 years old. Performed at [facility], [place] [address] [place], [place] [address]      Labs: BNP (last 3 results) No results for input(s): BNP in the last 8760 hours. Basic Metabolic Panel: Recent Labs  Lab 08/28/21 0118 08/29/21 0044  NA 138 135  K 4.1 3.9  CL 94* 90*  CO2 29 31  GLUCOSE 108* 108*  BUN 42* 56*  CREATININE 7.85* 9.48*  CALCIUM 9.5 9.0   Liver Function Tests: Recent Labs  Lab 08/28/21 1937  AST 11*  ALT 11  ALKPHOS 76  BILITOT 0.6  PROT 7.6  ALBUMIN 3.7   No results for input(s): LIPASE, AMYLASE in the last 168 hours. No results for input(s): AMMONIA in the last 168 hours. CBC: Recent Labs  Lab 08/28/21 0118 08/29/21 0044  WBC 5.1 4.5  HGB 10.4* 9.7*  HCT 33.1* 30.9*  MCV 88.0 86.1  PLT 185 168   Cardiac Enzymes: No results for input(s): CKTOTAL, CKMB, CKMBINDEX, TROPONINI in the last 168 hours. BNP: Invalid input(s): POCBNP CBG: Recent Labs  Lab 08/28/21 1203 08/28/21 1754 08/28/21 2136 08/29/21 0606 08/29/21 1159  GLUCAP 105* 100* 149* 95 98   D-Dimer No results for input(s): DDIMER in the last 72 hours. Hgb A1c No results for input(s): HGBA1C in the last 72 hours. Lipid Profile No results for input(s): CHOL, HDL, LDLCALC, TRIG, CHOLHDL, LDLDIRECT in the last 72 hours. Thyroid function studies No results for input(s): TSH, T4TOTAL, T3FREE, THYROIDAB in the last 72 hours.  Invalid input(s): FREET3 Anemia work  up No results for input(s): VITAMINB12, FOLATE, FERRITIN, TIBC, IRON, RETICCTPCT in the last 72 hours. Urinalysis    Component Value Date/Time   COLORURINE YELLOW 12/07/2020 1124   APPEARANCEUR CLEAR 12/07/2020 1124   LABSPEC 1.014 12/07/2020 1124   PHURINE 8.0 12/07/2020 1124   GLUCOSEU NEGATIVE 12/07/2020 1124   HGBUR NEGATIVE 12/07/2020 1124   [place] 02/23/2019 1606   KETONESUR NEGATIVE 12/07/2020 1124   PROTEINUR 2+ (A) 12/07/2020 1124   UROBILINOGEN 0.2 07/06/2015 1419   NITRITE NEGATIVE 02/23/2019 1606   LEUKOCYTESUR LARGE (A) 02/23/2019 1606   Sepsis Labs Invalid input(s): PROCALCITONIN,  WBC,  LACTICIDVEN Microbiology Recent Results (from the past 240 hour(s))  SARS CORONAVIRUS 2 (TAT 6-24 HRS) Nasopharyngeal Nasopharyngeal Swab     Status: None   Collection Time: 08/28/21  7:11 AM   Specimen: Nasopharyngeal Swab  Result Value Ref Range Status   SARS Coronavirus 2 NEGATIVE NEGATIVE Final    Comment: (NOTE) SARS-CoV-2 target nucleic acids are NOT DETECTED.  The SARS-CoV-2 RNA is generally detectable in upper and lower respiratory specimens during the acute phase of infection. Negative results do not preclude SARS-CoV-2 infection, do not rule out co-infections with other pathogens, and  should not be used as the sole basis for treatment or other patient management decisions. Negative results must be combined with clinical observations, patient history, and epidemiological information. The expected result is Negative.  Fact Sheet for Patients: SugarRoll.be  Fact Sheet for Healthcare Providers: https://www.woods-mathews.com/  This test is not yet approved or cleared by the [place] FDA and  has been authorized for detection and/or diagnosis of SARS-CoV-2 by FDA under an Emergency Use Authorization (EUA). This EUA will remain  in effect (meaning this test can be used) for the duration of the COVID-19  declaration under Se ction 564(b)(1) of the Act, 21 U.S.C. section 360bbb-3(b)(1), unless the authorization is terminated or revoked sooner.  Performed at [facility], [place] [address], [place], [place] [address]   MRSA Next Gen by PCR, Nasal     Status: None   Collection Time: 08/28/21  6:30 PM   Specimen: Nasal Mucosa; Nasal Swab  Result Value Ref Range Status   MRSA by PCR Next Gen NOT DETECTED NOT DETECTED Final    Comment: (NOTE) The GeneXpert MRSA Assay (FDA approved for NASAL specimens only), is one component of a comprehensive MRSA colonization surveillance program. It is not intended to diagnose MRSA infection nor to guide or monitor treatment for MRSA infections. Test performance is not FDA approved in patients less than 4 years old. Performed at [facility], [place] [address] [place], [place] [address]      Time coordinating discharge:  28 minutes  SIGNED:   Barb Merino, MD  [facility] 08/29/2021, 12:41 PM

## 2021-08-30 ENCOUNTER — Telehealth: Payer: Self-pay | Admitting: Cardiology

## 2021-08-30 ENCOUNTER — Telehealth: Payer: Self-pay

## 2021-08-30 DIAGNOSIS — E1129 Type 2 diabetes mellitus with other diabetic kidney complication: Secondary | ICD-10-CM | POA: Diagnosis not present

## 2021-08-30 DIAGNOSIS — N186 End stage renal disease: Secondary | ICD-10-CM | POA: Diagnosis not present

## 2021-08-30 DIAGNOSIS — Z992 Dependence on renal dialysis: Secondary | ICD-10-CM | POA: Diagnosis not present

## 2021-08-30 LAB — HEPATITIS B SURFACE ANTIBODY, QUANTITATIVE: Hep B S AB Quant (Post): 108.2 m[IU]/mL (ref >9.9)

## 2021-08-30 NOTE — Telephone Encounter (Signed)
Called patient on 08/30/2021 , 12:41 PM in an attempt to reach the patient for a hospital follow up.   Admit date: 08/28/21 Discharge: 08/29/21   She does not have any questions or concerns about medications from the hospital admission. The patient's medications were reviewed over the phone, they were counseled to bring in all current medications to the hospital follow up visit.   I advised the patient to call if any questions or concerns arise about the hospital admission or medications    Home health was not started in the hospital.  All questions were answered and a follow up appointment was made.   Prior to Admission medications   Medication Sig Start Date End Date Taking? Authorizing Provider  acetaminophen (TYLENOL) 500 MG tablet Take 1,000 mg by mouth every 6 (six) hours as needed (for pain.).    [provider]  apixaban (ELIQUIS) 5 MG TABS tablet Take 5 mg by mouth 2 (two) times daily.    [provider]  aspirin EC 81 MG tablet Take 81 mg by mouth daily.    [provider]  azelastine (ASTELIN) 0.1 % nasal spray Use 1 to 2 sprays each nostril 2 to 3 x / day Patient taking differently: Place 1 spray into both nostrils 2 (two) times daily as needed for rhinitis. 03/26/18   Unk Pinto, MD  carvedilol (COREG) 12.5 MG tablet Take 12.5 mg by mouth in the morning and at bedtime. 04/12/21   [provider]  cinacalcet (SENSIPAR) 60 MG tablet Take 1 tablet (60 mg total) by mouth daily. 09/11/17   Unk Pinto, MD  hydrALAZINE (APRESOLINE) 50 MG tablet Take 1 tablet (50 mg total) by mouth 3 (three) times daily. FOR BLOOD  PRESSURE AND HEART Patient taking differently: Take 50 mg by mouth 3 (three) times daily. 11/09/20   Lelon Perla, MD  latanoprost (XALATAN) 0.005 % ophthalmic solution Place 1 drop into both eyes at bedtime. 09/16/18   [provider]  losartan (COZAAR) 50 MG tablet Take 1 tablet Daily for BP Patient taking  differently: Take 50 mg by mouth in the morning and at bedtime. 03/19/20   Unk Pinto, MD  Multiple Vitamins-Minerals (ZINC PO) Take 1 tablet by mouth daily.    [provider]  nitroGLYCERIN (NITROSTAT) 0.4 MG SL tablet Place 0.4 mg under the tongue every 5 (five) minutes as needed for chest pain. 08/24/21   [provider]  ondansetron (ZOFRAN ODT) 4 MG disintegrating tablet Take 1 tablet (4 mg total) by mouth every 8 (eight) hours as needed for nausea or vomiting. 02/23/19   Rodell Perna A, PA-C  pantoprazole (PROTONIX) 40 MG tablet Take 1 tablet (40 mg total) by mouth daily. 08/29/21 10/28/21  Barb Merino, MD  RENVELA 800 MG tablet Take 1,600-4,000 mg by mouth See admin instructions. 4000 mg (5 caps) with meals and 1600 mg (2 caps) with snacks 12/21/15   [provider]  rosuvastatin (CRESTOR) 20 MG tablet Take 1 tablet Daily for Cholesterol Patient taking differently: Take 20 mg by mouth at bedtime. 03/19/20   Unk Pinto, MD  sertraline (ZOLOFT) 25 MG tablet Take 1 tablet Daily for Mood Patient taking differently: Take 25 mg by mouth daily. 03/19/20   Unk Pinto, MD  sildenafil (REVATIO) 20 MG tablet Take 2 tablets (40 mg) 3 x  /day fr Pulmonary Hypertension Patient taking differently: Take 40 mg by mouth 3 (three) times daily. 03/26/20   Unk Pinto, MD

## 2021-08-30 NOTE — Telephone Encounter (Signed)
Advised patient and scheduled follow up  

## 2021-08-30 NOTE — Telephone Encounter (Signed)
Spoke with patient and she would like Dr Stanford Breed to review recent hospitalization  Asked patient how she was feeling and she stated pain improved but feels like heart beating faster and like she "gives out" when up moving from room to room  Advised she would likely need to be seen in follow up and offered to schedule appointment  Patient again said would like Dr Stanford Breed to review first   Will forward to Dr Stanford Breed for review

## 2021-08-30 NOTE — Telephone Encounter (Signed)
Patient has a referral for "Intercostal pain and cardiomyopathy"   I offered to schedule her the next available which is in December with Jory Sims or to check one of the other offices for an earlier appt. Patient stated that she would like Dr. Stanford Breed to look at the referral and get his opinion on scheduling this referral.

## 2021-08-30 NOTE — Telephone Encounter (Signed)
See other telephone note.  

## 2021-08-31 DIAGNOSIS — D631 Anemia in chronic kidney disease: Secondary | ICD-10-CM | POA: Diagnosis not present

## 2021-08-31 DIAGNOSIS — N186 End stage renal disease: Secondary | ICD-10-CM | POA: Diagnosis not present

## 2021-08-31 DIAGNOSIS — D689 Coagulation defect, unspecified: Secondary | ICD-10-CM | POA: Diagnosis not present

## 2021-08-31 DIAGNOSIS — N2581 Secondary hyperparathyroidism of renal origin: Secondary | ICD-10-CM | POA: Diagnosis not present

## 2021-08-31 DIAGNOSIS — Z992 Dependence on renal dialysis: Secondary | ICD-10-CM | POA: Diagnosis not present

## 2021-08-31 DIAGNOSIS — I159 Secondary hypertension, unspecified: Secondary | ICD-10-CM | POA: Diagnosis not present

## 2021-08-31 DIAGNOSIS — Z23 Encounter for immunization: Secondary | ICD-10-CM | POA: Diagnosis not present

## 2021-08-31 DIAGNOSIS — E118 Type 2 diabetes mellitus with unspecified complications: Secondary | ICD-10-CM | POA: Diagnosis not present

## 2021-09-02 DIAGNOSIS — E118 Type 2 diabetes mellitus with unspecified complications: Secondary | ICD-10-CM | POA: Diagnosis not present

## 2021-09-02 DIAGNOSIS — I159 Secondary hypertension, unspecified: Secondary | ICD-10-CM | POA: Diagnosis not present

## 2021-09-02 DIAGNOSIS — D689 Coagulation defect, unspecified: Secondary | ICD-10-CM | POA: Diagnosis not present

## 2021-09-02 DIAGNOSIS — Z23 Encounter for immunization: Secondary | ICD-10-CM | POA: Diagnosis not present

## 2021-09-02 DIAGNOSIS — N186 End stage renal disease: Secondary | ICD-10-CM | POA: Diagnosis not present

## 2021-09-02 DIAGNOSIS — Z992 Dependence on renal dialysis: Secondary | ICD-10-CM | POA: Diagnosis not present

## 2021-09-02 DIAGNOSIS — N2581 Secondary hyperparathyroidism of renal origin: Secondary | ICD-10-CM | POA: Diagnosis not present

## 2021-09-02 DIAGNOSIS — D631 Anemia in chronic kidney disease: Secondary | ICD-10-CM | POA: Diagnosis not present

## 2021-09-05 DIAGNOSIS — N2581 Secondary hyperparathyroidism of renal origin: Secondary | ICD-10-CM | POA: Diagnosis not present

## 2021-09-05 DIAGNOSIS — I159 Secondary hypertension, unspecified: Secondary | ICD-10-CM | POA: Diagnosis not present

## 2021-09-05 DIAGNOSIS — N186 End stage renal disease: Secondary | ICD-10-CM | POA: Diagnosis not present

## 2021-09-05 DIAGNOSIS — Z992 Dependence on renal dialysis: Secondary | ICD-10-CM | POA: Diagnosis not present

## 2021-09-05 DIAGNOSIS — E118 Type 2 diabetes mellitus with unspecified complications: Secondary | ICD-10-CM | POA: Diagnosis not present

## 2021-09-05 DIAGNOSIS — D631 Anemia in chronic kidney disease: Secondary | ICD-10-CM | POA: Diagnosis not present

## 2021-09-05 DIAGNOSIS — D689 Coagulation defect, unspecified: Secondary | ICD-10-CM | POA: Diagnosis not present

## 2021-09-05 DIAGNOSIS — Z23 Encounter for immunization: Secondary | ICD-10-CM | POA: Diagnosis not present

## 2021-09-06 DIAGNOSIS — E1122 Type 2 diabetes mellitus with diabetic chronic kidney disease: Secondary | ICD-10-CM | POA: Diagnosis not present

## 2021-09-06 DIAGNOSIS — Z9889 Other specified postprocedural states: Secondary | ICD-10-CM | POA: Diagnosis not present

## 2021-09-06 DIAGNOSIS — N186 End stage renal disease: Secondary | ICD-10-CM | POA: Diagnosis not present

## 2021-09-06 DIAGNOSIS — N185 Chronic kidney disease, stage 5: Secondary | ICD-10-CM | POA: Diagnosis not present

## 2021-09-06 DIAGNOSIS — Z01810 Encounter for preprocedural cardiovascular examination: Secondary | ICD-10-CM | POA: Diagnosis not present

## 2021-09-06 DIAGNOSIS — I5030 Unspecified diastolic (congestive) heart failure: Secondary | ICD-10-CM | POA: Diagnosis not present

## 2021-09-06 DIAGNOSIS — I132 Hypertensive heart and chronic kidney disease with heart failure and with stage 5 chronic kidney disease, or end stage renal disease: Secondary | ICD-10-CM | POA: Diagnosis not present

## 2021-09-06 DIAGNOSIS — Z79899 Other long term (current) drug therapy: Secondary | ICD-10-CM | POA: Diagnosis not present

## 2021-09-06 DIAGNOSIS — I2721 Secondary pulmonary arterial hypertension: Secondary | ICD-10-CM | POA: Diagnosis not present

## 2021-09-06 DIAGNOSIS — I272 Pulmonary hypertension, unspecified: Secondary | ICD-10-CM | POA: Diagnosis not present

## 2021-09-06 DIAGNOSIS — I440 Atrioventricular block, first degree: Secondary | ICD-10-CM | POA: Diagnosis not present

## 2021-09-06 DIAGNOSIS — Z8673 Personal history of transient ischemic attack (TIA), and cerebral infarction without residual deficits: Secondary | ICD-10-CM | POA: Diagnosis not present

## 2021-09-06 DIAGNOSIS — Z7901 Long term (current) use of anticoagulants: Secondary | ICD-10-CM | POA: Diagnosis not present

## 2021-09-06 DIAGNOSIS — E559 Vitamin D deficiency, unspecified: Secondary | ICD-10-CM | POA: Diagnosis not present

## 2021-09-06 DIAGNOSIS — Z992 Dependence on renal dialysis: Secondary | ICD-10-CM | POA: Diagnosis not present

## 2021-09-06 DIAGNOSIS — I2510 Atherosclerotic heart disease of native coronary artery without angina pectoris: Secondary | ICD-10-CM | POA: Diagnosis not present

## 2021-09-06 NOTE — Progress Notes (Signed)
HPI: Follow-up pulmonary hypertension, diastolic heart failure and previous mitral valve repair. Renal dopplers Nov 2013 showed no RAS. Note previous pericardial effusion with minoxidil. Transesophageal echocardiogram July 2016 showed normal LV function, moderate to severe left atrial enlargement, small pericardial effusion, flail segment of anterior mitral valve leaflet with severe mitral regurgitation and mild tricuspid regurgitation. Cardiac catheterization September 2016 showed nonobstructive coronary disease and severely elevated pulmonary pressures. Carotid Dopplers August 2016 showed 1-39% bilateral stenosis. Patient had mitral valve repair in August 2016. Patient's course was complicated by acute hypoxic respiratory failure refractory to conventional ventilatory support. She was transferred to [facility] for ECMO. Course was complicated by need for tracheostomy. She also developed acute on chronic renal failure and now requires dialysis. Nuclear study January 2020 showed ejection fraction 57% and no scar or ischemia. FEV1 September 2021 1.21.  VQ scan September 2021 concerning for pulmonary embolus and left lower lobe apical segment. Followed at [facility] for pulmonary hypertension as she was being considered for renal transplant.  Recently admitted with chest pain.  This was felt to be musculoskeletal in etiology.  Troponins were unremarkable.  Echocardiogram October 2022 showed normal LV function, moderate left ventricular hypertrophy, mild right ventricular enlargement, moderate pulmonary hypertension, mild to moderate left atrial enlargement, mild right atrial enlargement, status post mitral valve repair with mild mitral regurgitation and mean gradient 9 mmHg suggestive of moderate mitral stenosis, moderate tricuspid regurgitation.  Right heart catheterization October 2022 showed cardiac output 5.8, cardiac index 3.2, RA pressure 6 mmHg, RV pressure 52/3, PA 50/23 and pulmonary capillary wedge  pressure 25.  Since last seen, patient had chest pain requiring above evaluation.  It is described as a tightness that is substernal with occasional radiation to her back.  Can occur with walking to her bedroom but it at times does not occur with activity.  No associated symptoms.  Last 5 to 10 minutes and resolve spontaneously.  Not pleuritic.  She denies dyspnea or syncope.  Current Outpatient Medications  Medication Sig Dispense Refill   acetaminophen (TYLENOL) 500 MG tablet Take 1,000 mg by mouth every 6 (six) hours as needed (for pain.).     apixaban (ELIQUIS) 5 MG TABS tablet Take 5 mg by mouth 2 (two) times daily.     aspirin EC 81 MG tablet Take 81 mg by mouth daily.     azelastine (ASTELIN) 0.1 % nasal spray Use 1 to 2 sprays each nostril 2 to 3 x / day (Patient taking differently: Place 1 spray into both nostrils 2 (two) times daily as needed for rhinitis.) 90 mL 3   carvedilol (COREG) 12.5 MG tablet Take 12.5 mg by mouth in the morning and at bedtime.     cinacalcet (SENSIPAR) 60 MG tablet Take 1 tablet (60 mg total) by mouth daily. 90 tablet 1   hydrALAZINE (APRESOLINE) 50 MG tablet Take 1 tablet (50 mg total) by mouth 3 (three) times daily. FOR BLOOD  PRESSURE AND HEART (Patient taking differently: Take 50 mg by mouth 3 (three) times daily.) 270 tablet 3   latanoprost (XALATAN) 0.005 % ophthalmic solution Place 1 drop into both eyes at bedtime.     losartan (COZAAR) 50 MG tablet Take 1 tablet Daily for BP (Patient taking differently: Take 50 mg by mouth in the morning and at bedtime.) 90 tablet 1   Multiple Vitamins-Minerals (ZINC PO) Take 1 tablet by mouth daily.     nitroGLYCERIN (NITROSTAT) 0.4 MG SL tablet Place 0.4 mg  under the tongue every 5 (five) minutes as needed for chest pain.     ondansetron (ZOFRAN ODT) 4 MG disintegrating tablet Take 1 tablet (4 mg total) by mouth every 8 (eight) hours as needed for nausea or vomiting. 10 tablet 0   pantoprazole (PROTONIX) 40 MG tablet Take  1 tablet (40 mg total) by mouth daily. 30 tablet 1   RENVELA 800 MG tablet Take 1,600-4,000 mg by mouth See admin instructions. 4000 mg (5 caps) with meals and 1600 mg (2 caps) with snacks  5   rosuvastatin (CRESTOR) 20 MG tablet Take 1 tablet Daily for Cholesterol (Patient taking differently: Take 20 mg by mouth at bedtime.) 90 tablet 3   sertraline (ZOLOFT) 25 MG tablet Take 1 tablet Daily for Mood (Patient taking differently: Take 25 mg by mouth daily.) 90 tablet 3   sildenafil (REVATIO) 20 MG tablet Take 2 tablets (40 mg) 3 x  /day fr Pulmonary Hypertension (Patient taking differently: Take 40 mg by mouth 3 (three) times daily.) 540 tablet 3   No current facility-administered medications for this visit.     Past Medical History:  Diagnosis Date   Anemia    Chronic diastolic CHF (congestive heart failure) (HCC)    Chronic diastolic heart failure (HCC)    CKD (chronic kidney disease) stage 4, GFR 15-29 ml/min (HCC)    dialysis M/W/F   Constipation    CVA (cerebral infarction) 1997   no residual deficit   Diverticulitis    DM (diabetes mellitus) (HCC)    type 2   Heart murmur    History of cardiovascular stress test    a. Myoview Oct 2012 showed EF 49%, no ischemia, LVE   HLD (hyperlipidemia)    takes Crestor daily   Hypertension    Pericardial effusion    chronic; felt to be poss related to minoxidil >> DC'd   Pulmonary hypertension ([place]) 06/18/2015   Respiratory failure, acute hypoxic, post-operative 07/08/2015   Requiring ECMO support   S/P cardiac catheterization    a. R/L [facility] 06/18/15:  mLAD 30%; severe pulmo HTN with PA sat 43%, CI 1.86, prominent V waves indicative of MR; resting hypoxemia O2 sat 86% on RA   S/P minimally invasive mitral valve repair 07/08/2015   Complex valvuloplasty including artificial Gore-tex neochord placement x10 and 26 mm Sorin Memo 3D Rechord ring annuloplasty via right mini thoracotomy approach   Severe mitral regurgitation    Shortness of breath  dyspnea    Stroke ([place]) 1997   no residual effect   Vitamin D deficiency    Wears glasses     Past Surgical History:  Procedure Laterality Date   A/V FISTULAGRAM Right 08/16/2017   Procedure: A/V Fistulagram;  Surgeon: Conrad Christmas, MD;  Location: [place] [facility];  Service: Cardiovascular;  Laterality: Right;   AV FISTULA PLACEMENT Left 10/22/2015   Procedure: RADIOCEPHALIC ARTERIOVENOUS (AV) FISTULA CREATION;  Surgeon: Serafina Mitchell, MD;  Location: [place] OR;  Service: Vascular;  Laterality: Left;   AV FISTULA PLACEMENT Right 05/22/2017   Procedure: ARTERIOVENOUS (AV) FISTULA CREATION-RIGHT ARM;  Surgeon: Waynetta Sandy, MD;  Location: [place];  Service: Vascular;  Laterality: Right;   CANNULATION FOR CARDIOPULMONARY BYPASS N/A 07/08/2015   Procedure: CANNULATION FOR ECMO;  Surgeon: Rexene Alberts, MD;  Location: [place];  Service: Open Heart Surgery;  Laterality: N/A;   CARDIAC CATHETERIZATION     CARDIAC CATHETERIZATION N/A 06/18/2015   Procedure: Right/Left Heart Cath and Coronary Angiography;  Surgeon: Jettie Booze, MD;  Location: [place] [facility];  Service: Cardiovascular;  Laterality: N/A;   CESAREAN SECTION     x 2   COLONOSCOPY     EXCHANGE OF A DIALYSIS CATHETER N/A 06/28/2017   Procedure: EXCHANGE OF A DIALYSIS CATHETER;  Surgeon: Waynetta Sandy, MD;  Location: [place];  Service: Vascular;  Laterality: N/A;   FISTULA SUPERFICIALIZATION Left 12/28/2015   Procedure: SUPERFICIALIZATION LEFT RADIOCEPHALIC FISTULA;  Surgeon: Serafina Mitchell, MD;  Location: [place];  Service: Vascular;  Laterality: Left;   FISTULA SUPERFICIALIZATION Right 08/21/2017   Procedure: FISTULA SUPERFICIALIZATION RIGHT ARM;  Surgeon: Waynetta Sandy, MD;  Location: [place];  Service: Vascular;  Laterality: Right;   INSERTION OF DIALYSIS CATHETER Right 04/28/2017   Procedure: INSERTION OF DIALYSIS CATHETER-RIGHT INTERNAL JUGULAR PLACEMENT;  Surgeon: Elam Dutch, MD;  Location:  [place];  Service: Vascular;  Laterality: Right;   LIGATION OF ARTERIOVENOUS  FISTULA Left 04/28/2017   Procedure: LIGATION OF ARTERIOVENOUS  FISTULA;  Surgeon: Elam Dutch, MD;  Location: [place];  Service: Vascular;  Laterality: Left;   MITRAL VALVE REPAIR Right 07/08/2015   Procedure: MINIMALLY INVASIVE MITRAL VALVE REPAIR with a 26 Sorin Memo 3D Rechord;  Surgeon: Rexene Alberts, MD;  Location: [place];  Service: Open Heart Surgery;  Laterality: Right;   MULTIPLE EXTRACTIONS WITH ALVEOLOPLASTY N/A 06/30/2015   Procedure: MULTIPLE EXTRACTIONS OF TOOTH #'S 4  AND 30 WITH ALVEOLOPLASTY AND GROSS DEBRIDEMENT  OF REMAINING TEETH;  Surgeon: Lenn Cal, DDS;  Location: [place];  Service: Oral Surgery;  Laterality: N/A;   PERIPHERAL VASCULAR CATHETERIZATION Left 03/28/2016   Procedure: A/V Fistulagram;  Surgeon: Serafina Mitchell, MD;  Location: [place] [facility];  Service: Cardiovascular;  Laterality: Left;  lower arm   TEE WITHOUT CARDIOVERSION N/A 06/08/2015   Procedure: TRANSESOPHAGEAL ECHOCARDIOGRAM (TEE);  Surgeon: Lelon Perla, MD;  Location: [facility] ENDOS[facility];  Service: Cardiovascular;  Laterality: N/A;   TEE WITHOUT CARDIOVERSION N/A 07/08/2015   Procedure: TRANSESOPHAGEAL ECHOCARDIOGRAM (TEE);  Surgeon: Rexene Alberts, MD;  Location: [place];  Service: Open Heart Surgery;  Laterality: N/A;    Social History   Socioeconomic History   Marital status: Married    Spouse name: Not on file   Number of children: 3   Years of education: Not on file   Highest education level: Not on file  Occupational History    Comment: Unemployed; used to work as a Radio broadcast assistant  Tobacco Use   Smoking status: Never   Smokeless tobacco: Never  Vaping Use   Vaping Use: Never used  Substance and Sexual Activity   Alcohol use: No    Alcohol/week: 0.0 standard drinks   Drug use: No   Sexual activity: Not on file  Other Topics Concern   Not on file  Social History Narrative   Not on file    Social Determinants of Health   Financial Resource Strain: Not on file  Food Insecurity: Not on file  Transportation Needs: Not on file  Physical Activity: Not on file  Stress: Not on file  Social Connections: Not on file  Intimate Partner Violence: Not on file    Family History  Problem Relation Age of Onset   Cancer Sister        breast cancer   Stroke Mother    Heart attack Brother        MI in his 26s   Heart disease Brother  before age 58   Breast cancer Sister     ROS: no fevers or chills, productive cough, hemoptysis, dysphasia, odynophagia, melena, hematochezia, dysuria, hematuria, rash, seizure activity, orthopnea, PND, pedal edema, claudication. Remaining systems are negative.  Physical Exam: Well-developed well-nourished in no acute distress.  Skin is warm and dry.  HEENT is normal.  Neck is supple.  Chest is clear to auscultation with normal expansion.  Cardiovascular exam is regular rate and rhythm.  Abdominal exam nontender or distended. No masses palpated. Extremities show no edema. neuro grossly intact  ECG-August 28, 2021-sinus tachycardia, anterior lateral ST depression and prolonged QT interval.  Personally reviewed.  A/P  1 recent chest pain-etiology unclear.  I will arrange a [place] nuclear study for risk stratification.  2 pulmonary hypertension-she is followed at [facility].  We will continue sildenafil and Opsumit.  She is also anticoagulated.  Her pulmonary hypertension is felt secondary to her previous mitral valve disease as well as group 5 from end-stage renal disease/hemodialysis.  3 prior mitral valve repair-we will continue SBE prophylaxis.  4 hypertension-blood pressure controlled.  Continue present medical regimen.  5 history of coronary disease-we will continue statin.    6 chronic diastolic congestive heart failure-volume per dialysis.  7 hyperlipidemia-continue statin.  8 end-stage renal disease-monitored by  nephrology.  She is apparently being considered for renal transplant.  [name], MD

## 2021-09-07 DIAGNOSIS — N186 End stage renal disease: Secondary | ICD-10-CM | POA: Diagnosis not present

## 2021-09-07 DIAGNOSIS — Z992 Dependence on renal dialysis: Secondary | ICD-10-CM | POA: Diagnosis not present

## 2021-09-07 DIAGNOSIS — N2581 Secondary hyperparathyroidism of renal origin: Secondary | ICD-10-CM | POA: Diagnosis not present

## 2021-09-07 DIAGNOSIS — Z23 Encounter for immunization: Secondary | ICD-10-CM | POA: Diagnosis not present

## 2021-09-07 DIAGNOSIS — I159 Secondary hypertension, unspecified: Secondary | ICD-10-CM | POA: Diagnosis not present

## 2021-09-07 DIAGNOSIS — D631 Anemia in chronic kidney disease: Secondary | ICD-10-CM | POA: Diagnosis not present

## 2021-09-07 DIAGNOSIS — E118 Type 2 diabetes mellitus with unspecified complications: Secondary | ICD-10-CM | POA: Diagnosis not present

## 2021-09-07 DIAGNOSIS — D689 Coagulation defect, unspecified: Secondary | ICD-10-CM | POA: Diagnosis not present

## 2021-09-08 ENCOUNTER — Encounter: Payer: Self-pay | Admitting: Internal Medicine

## 2021-09-08 ENCOUNTER — Other Ambulatory Visit: Payer: Self-pay

## 2021-09-08 ENCOUNTER — Ambulatory Visit (INDEPENDENT_AMBULATORY_CARE_PROVIDER_SITE_OTHER): Payer: Medicare Other | Admitting: Internal Medicine

## 2021-09-08 VITALS — BP 120/82 | HR 74 | Temp 97.1°F | Resp 16 | Ht 59.0 in | Wt 171.8 lb

## 2021-09-08 DIAGNOSIS — I10 Essential (primary) hypertension: Secondary | ICD-10-CM

## 2021-09-08 DIAGNOSIS — I5032 Chronic diastolic (congestive) heart failure: Secondary | ICD-10-CM

## 2021-09-08 DIAGNOSIS — E1122 Type 2 diabetes mellitus with diabetic chronic kidney disease: Secondary | ICD-10-CM

## 2021-09-08 DIAGNOSIS — N186 End stage renal disease: Secondary | ICD-10-CM

## 2021-09-08 DIAGNOSIS — E1169 Type 2 diabetes mellitus with other specified complication: Secondary | ICD-10-CM | POA: Diagnosis not present

## 2021-09-08 DIAGNOSIS — E785 Hyperlipidemia, unspecified: Secondary | ICD-10-CM | POA: Diagnosis not present

## 2021-09-08 DIAGNOSIS — Z992 Dependence on renal dialysis: Secondary | ICD-10-CM

## 2021-09-08 DIAGNOSIS — Z794 Long term (current) use of insulin: Secondary | ICD-10-CM | POA: Diagnosis not present

## 2021-09-08 DIAGNOSIS — I259 Chronic ischemic heart disease, unspecified: Secondary | ICD-10-CM | POA: Diagnosis not present

## 2021-09-08 DIAGNOSIS — Z9889 Other specified postprocedural states: Secondary | ICD-10-CM

## 2021-09-08 DIAGNOSIS — I272 Pulmonary hypertension, unspecified: Secondary | ICD-10-CM

## 2021-09-08 DIAGNOSIS — E669 Obesity, unspecified: Secondary | ICD-10-CM

## 2021-09-08 NOTE — Patient Instructions (Addendum)
Due to recent changes in healthcare laws, you Dudash see the results of your imaging and laboratory studies on MyChart before your provider has had a chance to review them.  We understand that in some cases there Gin be results that are confusing or concerning to you. Not all laboratory results come back in the same time frame and the provider Hoback be waiting for multiple results in order to interpret others.  Please give [place] 48 hours in order for your provider to thoroughly review all the results before contacting the office for clarification of your results.   +++++++++++++++++++++++++  Vit D  & Vit C 1,000 mg   are recommended to help protect  against the Covid-19 and other Corona viruses.    Also it's recommended  to take  Zinc 50 mg  to help  protect against the Covid-19   and best place to get  is also on [email or web address]  and don't pay more than 6-8 cents /pill !  ================================ Coronavirus (COVID-19) Are you at risk?  Are you at risk for the Coronavirus (COVID-19)?  To be considered HIGH RISK for Coronavirus (COVID-19), you have to meet the following criteria:  Traveled to [place], [place], [place], [place] or [place]; or in the [place] to [place], [place], [place]  or [place]; and have fever, cough, and shortness of breath within the last 2 weeks of travel OR Been in close contact with a person diagnosed with COVID-19 within the last 2 weeks and have  fever, cough,and shortness of breath  IF YOU DO NOT MEET THESE CRITERIA, YOU ARE CONSIDERED LOW RISK FOR COVID-19.  What to do if you are HIGH RISK for COVID-19?  If you are having a medical emergency, call 911. Seek medical care right away. Before you go to a doctor's office, urgent care or emergency department,  call ahead and tell them about your recent travel, contact with someone diagnosed with COVID-19   and your symptoms.  You should receive instructions from your physician's office regarding next  steps of care.  When you arrive at healthcare provider, tell the healthcare staff immediately you have returned from  visiting [place], [place], [place], [place] or [place]; or traveled in the [place] to [place], [place],  [place] or [place] in the last two weeks or you have been in close contact with a person diagnosed with  COVID-19 in the last 2 weeks.   Tell the health care staff about your symptoms: fever, cough and shortness of breath. After you have been seen by a medical provider, you will be either: Tested for (COVID-19) and discharged home on quarantine except to seek medical care if  symptoms worsen, and asked to  Stay home and avoid contact with others until you get your results (4-5 days)  Avoid travel on public transportation if possible (such as bus, train, or airplane) or Sent to the Emergency Department by EMS for evaluation, COVID-19 testing  and  possible admission depending on your condition and test results.  What to do if you are LOW RISK for COVID-19?  Reduce your risk of any infection by using the same precautions used for avoiding the common cold or flu:  Wash your hands often with soap and warm water for at least 20 seconds.  If soap and water are not readily available,  use an alcohol-based hand sanitizer with at least 60% alcohol.  If coughing or sneezing, cover your mouth and nose by coughing  or sneezing into the elbow areas of your shirt or coat,  into a tissue or into your sleeve (not your hands). Avoid shaking hands with others and consider head nods or verbal greetings only. Avoid touching your eyes, nose, or mouth with unwashed hands.  Avoid close contact with people who are sick. Avoid places or events with large numbers of people in one location, like concerts or sporting events. Carefully consider travel plans you have or are making. If you are planning any travel outside or inside the [place], visit the CDC's Travelers' Health webpage for the  latest health notices. If you have some symptoms but not all symptoms, continue to monitor at home and seek medical attention  if your symptoms worsen. If you are having a medical emergency, call 911.   >>>>>>>>>>>>>>>>>>>>>>>>>>>>>>>>>  We Do NOT Approve of [employer] > > > > > > > > > > > > > > > > > > > > > > > > > > > > > > > > > > > > > > >  Preventive Care for Adults  A healthy lifestyle and preventive care can promote health and wellness. Preventive health guidelines for women include the following key practices. A routine yearly physical is a good way to check with your health care provider about your health and preventive screening. It is a chance to share any concerns and updates on your health and to receive a thorough exam. Visit your dentist for a routine exam and preventive care every 6 months. Brush your teeth twice a day and floss once a day. Good oral hygiene prevents tooth decay and gum disease. The frequency of eye exams is based on your age, health, family medical history, use of contact lenses, and other factors. Follow your health care provider's recommendations for frequency of eye exams. Eat a healthy diet. Foods like vegetables, fruits, whole grains, low-fat dairy products, and lean protein foods contain the nutrients you need without too many calories. Decrease your intake of foods high in solid fats, added sugars, and salt. Eat the right amount of calories for you. Get information about a proper diet from your health care provider, if necessary. Regular physical exercise is one of the most important things you can do for your health. Most adults should get at least 150 minutes of moderate-intensity exercise (any activity that increases your heart rate and causes you to sweat) each week. In addition, most adults need muscle-strengthening exercises on 2 or more days a week. Maintain a healthy weight. The body mass index (BMI) is a screening tool to identify  possible weight problems. It provides an estimate of body fat based on height and weight. Your health care provider can find your BMI and can help you achieve or maintain a healthy weight. For adults 20 years and older: A BMI below 18.5 is considered underweight. A BMI of 18.5 to 24.9 is normal. A BMI of 25 to 29.9 is considered overweight. A BMI of 30 and above is considered obese. Maintain normal blood lipids and cholesterol levels by exercising and minimizing your intake of saturated fat. Eat a balanced diet with plenty of fruit and vegetables. If your lipid or cholesterol levels are high, you are over 50, or you are at high risk for heart disease, you may need your cholesterol levels checked more frequently. Ongoing high lipid and cholesterol levels should be treated with medicines if diet and exercise are not working. If you smoke, find out from  your health care provider how to quit. If you do not use tobacco, do not start. Lung cancer screening is recommended for adults aged 55-80 years who are at high risk for developing lung cancer because of a history of smoking. A yearly low-dose CT scan of the lungs is recommended for people who have at least a 30-pack-year history of smoking and are a current smoker or have quit within the past 15 years. A pack year of smoking is smoking an average of 1 pack of cigarettes a day for 1 year (for example: 1 pack a day for 30 years or 2 packs a day for 15 years). Yearly screening should continue until the smoker has stopped smoking for at least 15 years. Yearly screening should be stopped for people who develop a health problem that would prevent them from having lung cancer treatment. Avoid use of street drugs. Do not share needles with anyone. Ask for help if you need support or instructions about stopping the use of drugs. High blood pressure causes heart disease and increases the risk of stroke.  Ongoing high blood pressure should be treated with medicines if  weight loss and exercise do not work. If you are 55-79 years old, ask your health care provider if you should take aspirin to prevent strokes. Diabetes screening involves taking a blood sample to check your fasting blood sugar level. This should be done once every 3 years, after age 45, if you are within normal weight and without risk factors for diabetes. Testing should be considered at a younger age or be carried out more frequently if you are overweight and have at least 1 risk factor for diabetes. Breast cancer screening is essential preventive care for women. You should practice "breast self-awareness." This means understanding the normal appearance and feel of your breasts and may include breast self-examination. Any changes detected, no matter how small, should be reported to a health care provider. Women in their 20s and 30s should have a clinical breast exam (CBE) by a health care provider as part of a regular health exam every 1 to 3 years. After age 40, women should have a CBE every year. Starting at age 40, women should consider having a mammogram (breast X-ray test) every year. Women who have a family history of breast cancer should talk to their health care provider about genetic screening. Women at a high risk of breast cancer should talk to their health care providers about having an MRI and a mammogram every year. Breast cancer gene (BRCA)-related cancer risk assessment is recommended for women who have family members with BRCA-related cancers. BRCA-related cancers include breast, ovarian, tubal, and peritoneal cancers. Having family members with these cancers may be associated with an increased risk for harmful changes (mutations) in the breast cancer genes BRCA1 and BRCA2. Results of the assessment will determine the need for genetic counseling and BRCA1 and BRCA2 testing. Routine pelvic exams to screen for cancer are no longer recommended for nonpregnant women who are considered low risk for  cancer of the pelvic organs (ovaries, uterus, and vagina) and who do not have symptoms. Ask your health care provider if a screening pelvic exam is right for you. If you have had past treatment for cervical cancer or a condition that could lead to cancer, you need Pap tests and screening for cancer for at least 20 years after your treatment. If Pap tests have been discontinued, your risk factors (such as having a new sexual partner) need to be   reassessed to determine if screening should be resumed. Some women have medical problems that increase the chance of getting cervical cancer. In these cases, your health care provider may recommend more frequent screening and Pap tests.  Colorectal cancer can be detected and often prevented. Most routine colorectal cancer screening begins at the age of 9 years and continues through age 47 years. However, your health care provider may recommend screening at an earlier age if you have risk factors for colon cancer. On a yearly basis, your health care provider may provide home test kits to check for hidden blood in the stool. Use of a small camera at the end of a tube, to directly examine the colon (sigmoidoscopy or colonoscopy), can detect the earliest forms of colorectal cancer. Talk to your health care provider about this at age 66, when routine screening begins.  Direct exam of the colon should be repeated every 5-10 years through age 98 years, unless early forms of pre-cancerous polyps or small growths are found. Osteoporosis is a disease in which the bones lose minerals and strength with aging. This can result in serious bone fractures or breaks. The risk of osteoporosis can be identified using a bone density scan. Women ages 48 years and over and women at risk for fractures or osteoporosis should discuss screening with their health care providers. Ask your health care provider whether you should take a calcium supplement or vitamin D to reduce the rate of  osteoporosis. Menopause can be associated with physical symptoms and risks. Hormone replacement therapy is available to decrease symptoms and risks. You should talk to your health care provider about whether hormone replacement therapy is right for you. Use sunscreen. Apply sunscreen liberally and repeatedly throughout the day. You should seek shade when your shadow is shorter than you. Protect yourself by wearing long sleeves, pants, a wide-brimmed hat, and sunglasses year round, whenever you are outdoors. Once a month, do a whole body skin exam, using a mirror to look at the skin on your back. Tell your health care provider of new moles, moles that have irregular borders, moles that are larger than a pencil eraser, or moles that have changed in shape or color. Stay current with required vaccines (immunizations). Influenza vaccine. All adults should be immunized every year. Tetanus, diphtheria, and acellular pertussis (Td, Tdap) vaccine. Pregnant women should receive 1 dose of Tdap vaccine during each pregnancy. The dose should be obtained regardless of the length of time since the last dose. Immunization is preferred during the 27th-36th week of gestation. An adult who has not previously received Tdap or who does not know her vaccine status should receive 1 dose of Tdap. This initial dose should be followed by tetanus and diphtheria toxoids (Td) booster doses every 10 years. Adults with an unknown or incomplete history of completing a 3-dose immunization series with Td-containing vaccines should begin or complete a primary immunization series including a Tdap dose. Adults should receive a Td booster every 10 years.  Zoster vaccine. One dose is recommended for adults aged 47 years or older unless certain conditions are present.  Pneumococcal 13-valent conjugate (PCV13) vaccine. When indicated, a person who is uncertain of her immunization history and has no record of immunization should receive the PCV13  vaccine. An adult aged 63 years or older who has certain medical conditions and has not been previously immunized should receive 1 dose of PCV13 vaccine. This PCV13 should be followed with a dose of pneumococcal polysaccharide (PPSV23) vaccine. The PPSV23  vaccine dose should be obtained at least 1 or more year(s) after the dose of PCV13 vaccine. An adult aged 19 years or older who has certain medical conditions and previously received 1 or more doses of PPSV23 vaccine should receive 1 dose of PCV13. The PCV13 vaccine dose should be obtained 1 or more years after the last PPSV23 vaccine dose.  Pneumococcal polysaccharide (PPSV23) vaccine. When PCV13 is also indicated, PCV13 should be obtained first. All adults aged 65 years and older should be immunized. An adult younger than age 65 years who has certain medical conditions should be immunized. Any person who resides in a nursing home or long-term care facility should be immunized. An adult smoker should be immunized. People with an immunocompromised condition and certain other conditions should receive both PCV13 and PPSV23 vaccines. People with human immunodeficiency virus (HIV) infection should be immunized as soon as possible after diagnosis. Immunization during chemotherapy or radiation therapy should be avoided. Routine use of PPSV23 vaccine is not recommended for American Indians, [place] Natives, or people younger than 65 years unless there are medical conditions that require PPSV23 vaccine. When indicated, people who have unknown immunization and have no record of immunization should receive PPSV23 vaccine. One-time revaccination 5 years after the first dose of PPSV23 is recommended for people aged 19-64 years who have chronic kidney failure, nephrotic syndrome, asplenia, or immunocompromised conditions. People who received 1-2 doses of PPSV23 before age 65 years should receive another dose of PPSV23 vaccine at age 65 years or later if at least 5 years have  passed since the previous dose. Doses of PPSV23 are not needed for people immunized with PPSV23 at or after age 65 years.  Preventive Services / Frequency  Ages 65 years and over Blood pressure check. Lipid and cholesterol check. Lung cancer screening. / Every year if you are aged 55-80 years and have a 30-pack-year history of smoking and currently smoke or have quit within the past 15 years. Yearly screening is stopped once you have quit smoking for at least 15 years or develop a health problem that would prevent you from having lung cancer treatment. Clinical breast exam.** / Every year after age 40 years.  BRCA-related cancer risk assessment.** / For women who have family members with a BRCA-related cancer (breast, ovarian, tubal, or peritoneal cancers). Mammogram.** / Every year beginning at age 40 years and continuing for as long as you are in good health. Consult with your health care provider. Pap test.** / Every 3 years starting at age 30 years through age 65 or 70 years with 3 consecutive normal Pap tests. Testing can be stopped between 65 and 70 years with 3 consecutive normal Pap tests and no abnormal Pap or HPV tests in the past 10 years. Fecal occult blood test (FOBT) of stool. / Every year beginning at age 50 years and continuing until age 75 years. You may not need to do this test if you get a colonoscopy every 10 years. Flexible sigmoidoscopy or colonoscopy.** / Every 5 years for a flexible sigmoidoscopy or every 10 years for a colonoscopy beginning at age 50 years and continuing until age 75 years. Hepatitis C blood test.** / For all people born from 1945 through 1965 and any individual with known risks for hepatitis C. Osteoporosis screening.** / A one-time screening for women ages 65 years and over and women at risk for fractures or osteoporosis. Skin self-exam. / Monthly. Influenza vaccine. / Every year. Tetanus, diphtheria, and acellular pertussis (Tdap/Td) vaccine.** /   1 dose  of Td every 10 years. Zoster vaccine.** / 1 dose for adults aged 60 years or older. Pneumococcal 13-valent conjugate (PCV13) vaccine.** / Consult your health care provider. Pneumococcal polysaccharide (PPSV23) vaccine.** / 1 dose for all adults aged 65 years and older. Screening for abdominal aortic aneurysm (AAA)  by ultrasound is recommended for people who have history of high blood pressure or who are current or former smokers. ++++++++++++++++++++ Recommend Adult Low Dose Aspirin or  coated  Aspirin 81 mg daily  To reduce risk of Colon Cancer 40 %,  Skin Cancer 26 % ,  Melanoma 46%  and  Pancreatic cancer 60% ++++++++++++++++++++ Vitamin D goal  is between 70-100.  Please make sure that you are taking your Vitamin D as directed.  It is very important as a natural anti-inflammatory  helping hair, skin, and nails, as well as reducing stroke and heart attack risk.  It helps your bones and helps with mood. It also decreases numerous cancer risks so please take it as directed.  Low Vit D is associated with a 200-300% higher risk for CANCER  and 200-300% higher risk for HEART   ATTACK  &  STROKE.   ...................................... It is also associated with higher death rate at younger ages,  autoimmune diseases like Rheumatoid arthritis, Lupus, Multiple Sclerosis.    Also many other serious conditions, like depression, Alzheimer's Dementia, infertility, muscle aches, fatigue, fibromyalgia - just to name a few. ++++++++++++++++++ Recommend the book "The END of DIETING" by Dr Joel Fuhrman  & the book "The END of DIABETES " by Dr Joel Fuhrman At [employer] - get book & Audio CD's    Being diabetic has a  300% increased risk for heart attack, stroke, cancer, and alzheimer- type vascular dementia. It is very important that you work harder with diet by avoiding all foods that are white. Avoid white rice (brown & wild rice is OK), white potatoes (sweetpotatoes in moderation is OK),  White bread or wheat bread or anything made out of white flour like bagels, donuts, rolls, buns, biscuits, cakes, pastries, cookies, pizza crust, and pasta (made from white flour & egg whites) - vegetarian pasta or spinach or wheat pasta is OK. Multigrain breads like Arnold's or [employer], or multigrain sandwich thins or flatbreads.  Diet, exercise and weight loss can reverse and cure diabetes in the early stages.  Diet, exercise and weight loss is very important in the control and prevention of complications of diabetes which affects every system in your body, ie. Brain - dementia/stroke, eyes - glaucoma/blindness, heart - heart attack/heart failure, kidneys - dialysis, stomach - gastric paralysis, intestines - malabsorption, nerves - severe painful neuritis, circulation - gangrene & loss of a leg(s), and finally cancer and Alzheimers.    I recommend avoid fried & greasy foods,  sweets/candy, white rice (brown or wild rice or Quinoa is OK), white potatoes (sweet potatoes are OK) - anything made from white flour - bagels, doughnuts, rolls, buns, biscuits,white and wheat breads, pizza crust and traditional pasta made of white flour & egg white(vegetarian pasta or spinach or wheat pasta is OK).  Multi-grain bread is OK - like multi-grain flat bread or sandwich thins. Avoid alcohol in excess. Exercise is also important.    Eat all the vegetables you want - avoid meat, especially red meat and dairy - especially cheese.  Cheese is the most concentrated form of trans-fats which is the worst thing to clog up our arteries. Veggie cheese is OK   which can be found in the fresh produce section at [employer] or [employer] or [employer]  +++++++++++++++++++ DASH Eating Plan  DASH stands for "Dietary Approaches to Stop Hypertension."   The DASH eating plan is a healthy eating plan that has been shown to reduce high blood pressure (hypertension). Additional health benefits may include reducing the risk of type 2  diabetes mellitus, heart disease, and stroke. The DASH eating plan may also help with weight loss. WHAT DO I NEED TO KNOW ABOUT THE DASH EATING PLAN? For the DASH eating plan, you will follow these general guidelines: Choose foods with a percent daily value for sodium of less than 5% (as listed on the food label). Use salt-free seasonings or herbs instead of table salt or sea salt. Check with your health care provider or pharmacist before using salt substitutes. Eat lower-sodium products, often labeled as "lower sodium" or "no salt added." Eat fresh foods. Eat more vegetables, fruits, and low-fat dairy products. Choose whole grains. Look for the word "whole" as the first word in the ingredient list. Choose fish  Limit sweets, desserts, sugars, and sugary drinks. Choose heart-healthy fats. Eat veggie cheese  Eat more home-cooked food and less restaurant, buffet, and fast food. Limit fried foods. Cook foods using methods other than frying. Limit canned vegetables. If you do use them, rinse them well to decrease the sodium. When eating at a restaurant, ask that your food be prepared with less salt, or no salt if possible.                      WHAT FOODS CAN I EAT? Read Dr Joel Fuhrman's books on The End of Dieting & The End of Diabetes  Grains Whole grain or whole wheat bread. Brown rice. Whole grain or whole wheat pasta. Quinoa, bulgur, and whole grain cereals. Low-sodium cereals. Corn or whole wheat flour tortillas. Whole grain cornbread. Whole grain crackers. Low-sodium crackers.  Vegetables Fresh or frozen vegetables (raw, steamed, roasted, or grilled). Low-sodium or reduced-sodium tomato and vegetable juices. Low-sodium or reduced-sodium tomato sauce and paste. Low-sodium or reduced-sodium canned vegetables.   Fruits All fresh, canned (in natural juice), or frozen fruits.  Protein Products  All fish and seafood.  Dried beans, peas, or lentils. Unsalted nuts and seeds. Unsalted  canned beans.  Dairy Low-fat dairy products, such as skim or 1% milk, 2% or reduced-fat cheeses, low-fat ricotta or cottage cheese, or plain low-fat yogurt. Low-sodium or reduced-sodium cheeses.  Fats and Oils Tub margarines without trans fats. Light or reduced-fat mayonnaise and salad dressings (reduced sodium). Avocado. Safflower, olive, or canola oils. Natural peanut or almond butter.  Other Unsalted popcorn and pretzels. The items listed above may not be a complete list of recommended foods or beverages. Contact your dietitian for more options.  +++++++++++++++  WHAT FOODS ARE NOT RECOMMENDED? Grains/ White flour or wheat flour White bread. White pasta. White rice. Refined cornbread. Bagels and croissants. Crackers that contain trans fat.  Vegetables  Creamed or fried vegetables. Vegetables in a . Regular canned vegetables. Regular canned tomato sauce and paste. Regular tomato and vegetable juices.  Fruits Dried fruits. Canned fruit in light or heavy syrup. Fruit juice.  Meat and Other Protein Products Meat in general - RED meat & White meat.  Fatty cuts of meat. Ribs, chicken wings, all processed meats as bacon, sausage, bologna, salami, fatback, hot dogs, bratwurst and packaged luncheon meats.  Dairy Whole or 2% milk, cream, half-and-half, and cream cheese.   Whole-fat or sweetened yogurt. Full-fat cheeses or blue cheese. Non-dairy creamers and whipped toppings. Processed cheese, cheese spreads, or cheese curds.  Condiments Onion and garlic salt, seasoned salt, table salt, and sea salt. Canned and packaged gravies. Worcestershire sauce. Tartar sauce. Barbecue sauce. Teriyaki sauce. Soy sauce, including reduced sodium. Steak sauce. Fish sauce. Oyster sauce. Cocktail sauce. Horseradish. Ketchup and mustard. Meat flavorings and tenderizers. Bouillon cubes. Hot sauce. Tabasco sauce. Marinades. Taco seasonings. Relishes.  Fats and Oils Butter, stick margarine, lard, shortening and  bacon fat. Coconut, palm kernel, or palm oils. Regular salad dressings.  Pickles and olives. Salted popcorn and pretzels.  The items listed above may not be a complete list of foods and beverages to avoid.   

## 2021-09-08 NOTE — Progress Notes (Signed)
Future Appointments  Date Time Provider [place]  09/12/2021  3:20 PM Lelon Perla, MD [facility] None  12/08/2021 11:00 AM Unk Pinto, MD [facility] None    [facility] Follow-Up     This very nice 65 y.o. [name]  was admitted to the hospital on  08/28/2021  and patient was discharged from the hospital on 08/29/2021. The patient now presents for 10 day  follow up for transition from recent hospitalization .  The day after discharge  our clinical staff contacted the patient to assure stability and schedule a follow up appointment. The discharge summary, medications and diagnostic test results were reviewed before meeting with the patient. The patient was admitted for:   Chest pain due to myocardial ischemia, unspecified ischemic chest pain type Essential hypertension Hyperlipidemia associated with type 2 diabetes mellitus ([place]) Type 2 diabetes mellitus with chronic kidney disease on chronic dialysis, with long-term current use of insulin (HCC) Obesity (BMI 30.0-34.9) Pulmonary hypertension (HCC) Chronic diastolic heart failure (HCC) S/P minimally invasive mitral valve repair ESRD on dialysis [facility])      Patient is a very nice 65 yo [name] with ESRD on Dialysis awaiting renal transplant who was admitted with 2 week hx/o chest pain to r/o ACS. Acute MI was ruled out. '@D'$  cardiac echo wqas negative. Patient received dialysis in the hospital. ACS was ruled out & patient was discharged d for out-patient follow-up. Protonix was added empirically to her med regimen.       Hospitalization discharge instructions and medications are reconciled with the patient.      Patient is also followed with Hypertension, Hyperlipidemia, T2DM and Vitamin D Deficiency.      Patient is treated for HTN  (1999) & BP has been controlled at home. Today's BP: 120/82. Patient has had no complaints of any cardiac type chest pain, palpitations, dyspnea/orthopnea/PND, dizziness, claudication, or dependent  edema.     Hyperlipidemia is controlled with diet & meds. Patient denies myalgias or other med SE's. Last Lipids were not at goal:  Lab Results  Component Value Date   CHOL 243 (H) 04/21/2021   HDL 47 (L) 04/21/2021   LDLCALC 162 (H) 04/21/2021   TRIG 181 (H) 04/21/2021   CHOLHDL 5.2 (H) 04/21/2021      Also, the patient has history of T2_NIDDM (2006) using Insulin prn and has had no symptoms of reactive hypoglycemia, diabetic polys, paresthesias or visual blurring.  Last A1c was normal & at goal:  Lab Results  Component Value Date    HGBA1C 5.0 04/21/2021      Further, the patient also has history of Vitamin D Deficiency ("12" /2008) and supplements vitamin D without any suspected side-effects. Last vitamin D was  still very low:  Lab Results  Component Value Date   VD25OH 36 10/30/2019   Current Outpatient Medications on File Prior to Visit  Medication Sig   acetaminophen (TYLENOL) 500 MG tablet Take 1,000 mg by mouth every 6 (six) hours as needed (for pain.).   apixaban (ELIQUIS) 5 MG TABS tablet Take 5 mg by mouth 2 (two) times daily.   aspirin EC 81 MG tablet Take 81 mg by mouth daily.   azelastine (ASTELIN) 0.1 % nasal spray Use 1 to 2 sprays each nostril 2 to 3 x / day (Patient taking differently: Place 1 spray into both nostrils 2 (two) times daily as needed for rhinitis.)   carvedilol (COREG) 12.5 MG tablet Take 12.5 mg by mouth in the morning and at bedtime.  cinacalcet (SENSIPAR) 60 MG tablet Take 1 tablet (60 mg total) by mouth daily.   hydrALAZINE (APRESOLINE) 50 MG tablet Take 1 tablet (50 mg total) by mouth 3 (three) times daily. FOR BLOOD  PRESSURE AND HEART (Patient taking differently: Take 50 mg by mouth 3 (three) times daily.)   latanoprost (XALATAN) 0.005 % ophthalmic solution Place 1 drop into both eyes at bedtime.   losartan (COZAAR) 50 MG tablet Take 1 tablet Daily for BP (Patient taking differently: Take 50 mg by mouth in the morning and at bedtime.)    Multiple Vitamins-Minerals (ZINC PO) Take 1 tablet by mouth daily.   nitroGLYCERIN (NITROSTAT) 0.4 MG SL tablet Place 0.4 mg under the tongue every 5 (five) minutes as needed for chest pain.   ondansetron (ZOFRAN ODT) 4 MG disintegrating tablet Take 1 tablet (4 mg total) by mouth every 8 (eight) hours as needed for nausea or vomiting.   pantoprazole (PROTONIX) 40 MG tablet Take 1 tablet (40 mg total) by mouth daily.   RENVELA 800 MG tablet Take 1,600-4,000 mg by mouth See admin instructions. 4000 mg (5 caps) with meals and 1600 mg (2 caps) with snacks   rosuvastatin (CRESTOR) 20 MG tablet Take 1 tablet Daily for Cholesterol (Patient taking differently: Take 20 mg by mouth at bedtime.)   sertraline (ZOLOFT) 25 MG tablet Take 1 tablet Daily for Mood (Patient taking differently: Take 25 mg by mouth daily.)   sildenafil (REVATIO) 20 MG tablet Take 2 tablets (40 mg) 3 x  /day fr Pulmonary Hypertension (Patient taking differently: Take 40 mg by mouth 3 (three) times daily.)    Allergies  Allergen Reactions   Minoxidil Other (See Comments)    Pericardial effusion   Lipitor [Atorvastatin] Other (See Comments)    MYALGIAS > "pain in legs" Tolerates rosuvastatin   Morphine And Related Itching   Ace Inhibitors Other (See Comments)    REACTION: "not sure...think it made me drowsy all the time"   PMHx:   Past Medical History:  Diagnosis Date   Anemia    Chronic diastolic CHF (congestive heart failure) (HCC)    Chronic diastolic heart failure (HCC)    CKD (chronic kidney disease) stage 4, GFR 15-29 ml/min (HCC)    dialysis M/W/F   Constipation    CVA (cerebral infarction) 1997   no residual deficit   Diverticulitis    DM (diabetes mellitus) (HCC)    type 2   Heart murmur    History of cardiovascular stress test    a. Myoview Oct 2012 showed EF 49%, no ischemia, LVE   HLD (hyperlipidemia)    takes Crestor daily   Hypertension    Pericardial effusion    chronic; felt to be poss related to  minoxidil >> DC'd   Pulmonary hypertension ([place]) 06/18/2015   Respiratory failure, acute hypoxic, post-operative 07/08/2015   Requiring ECMO support   S/P cardiac catheterization    a. R/L [facility] 06/18/15:  mLAD 30%; severe pulmo HTN with PA sat 43%, CI 1.86, prominent V waves indicative of MR; resting hypoxemia O2 sat 86% on RA   S/P minimally invasive mitral valve repair 07/08/2015   Complex valvuloplasty including artificial Gore-tex neochord placement x10 and 26 mm Sorin Memo 3D Rechord ring annuloplasty via right mini thoracotomy approach   Severe mitral regurgitation    Shortness of breath dyspnea    Stroke ([place]) 1997   no residual effect   Vitamin D deficiency    Wears glasses  Past Surgical History:  Procedure Laterality Date   A/V FISTULAGRAM Right 08/16/2017   Procedure: A/V Fistulagram;  Surgeon: Conrad Weston, MD;  Location: [place] [facility];  Service: Cardiovascular;  Laterality: Right;   AV FISTULA PLACEMENT Left 10/22/2015   Procedure: RADIOCEPHALIC ARTERIOVENOUS (AV) FISTULA CREATION;  Surgeon: Serafina Mitchell, MD;  Location: [place] OR;  Service: Vascular;  Laterality: Left;   AV FISTULA PLACEMENT Right 05/22/2017   Procedure: ARTERIOVENOUS (AV) FISTULA CREATION-RIGHT ARM;  Surgeon: Waynetta Sandy, MD;  Location: [place];  Service: Vascular;  Laterality: Right;   CANNULATION FOR CARDIOPULMONARY BYPASS N/A 07/08/2015   Procedure: CANNULATION FOR ECMO;  Surgeon: Rexene Alberts, MD;  Location: [place];  Service: Open Heart Surgery;  Laterality: N/A;   CARDIAC CATHETERIZATION     CARDIAC CATHETERIZATION N/A 06/18/2015   Procedure: Right/Left Heart Cath and Coronary Angiography;  Surgeon: Jettie Booze, MD;  Location: [place] [facility];  Service: Cardiovascular;  Laterality: N/A;   CESAREAN SECTION     x 2   COLONOSCOPY     EXCHANGE OF A DIALYSIS CATHETER N/A 06/28/2017   Procedure: EXCHANGE OF A DIALYSIS CATHETER;  Surgeon: Waynetta Sandy, MD;  Location: [place];  Service: Vascular;  Laterality: N/A;   FISTULA SUPERFICIALIZATION Left 12/28/2015   Procedure: SUPERFICIALIZATION LEFT RADIOCEPHALIC FISTULA;  Surgeon: Serafina Mitchell, MD;  Location: [place];  Service: Vascular;  Laterality: Left;   FISTULA SUPERFICIALIZATION Right 08/21/2017   Procedure: FISTULA SUPERFICIALIZATION RIGHT ARM;  Surgeon: Waynetta Sandy, MD;  Location: [place];  Service: Vascular;  Laterality: Right;   INSERTION OF DIALYSIS CATHETER Right 04/28/2017   Procedure: INSERTION OF DIALYSIS CATHETER-RIGHT INTERNAL JUGULAR PLACEMENT;  Surgeon: Elam Dutch, MD;  Location: [place];  Service: Vascular;  Laterality: Right;   LIGATION OF ARTERIOVENOUS  FISTULA Left 04/28/2017   Procedure: LIGATION OF ARTERIOVENOUS  FISTULA;  Surgeon: Elam Dutch, MD;  Location: [place];  Service: Vascular;  Laterality: Left;   MITRAL VALVE REPAIR Right 07/08/2015   Procedure: MINIMALLY INVASIVE MITRAL VALVE REPAIR with a 26 Sorin Memo 3D Rechord;  Surgeon: Rexene Alberts, MD;  Location: [place];  Service: Open Heart Surgery;  Laterality: Right;   MULTIPLE EXTRACTIONS WITH ALVEOLOPLASTY N/A 06/30/2015   Procedure: MULTIPLE EXTRACTIONS OF TOOTH #'S 4  AND 30 WITH ALVEOLOPLASTY AND GROSS DEBRIDEMENT  OF REMAINING TEETH;  Surgeon: Lenn Cal, DDS;  Location: [place];  Service: Oral Surgery;  Laterality: N/A;   PERIPHERAL VASCULAR CATHETERIZATION Left 03/28/2016   Procedure: A/V Fistulagram;  Surgeon: Serafina Mitchell, MD;  Location: [place] [facility];  Service: Cardiovascular;  Laterality: Left;  lower arm   TEE WITHOUT CARDIOVERSION N/A 06/08/2015   Procedure: TRANSESOPHAGEAL ECHOCARDIOGRAM (TEE);  Surgeon: Lelon Perla, MD;  Location[facility] ENDOSCOPY;  Service: Cardiovascular;  Laterality: N/A;   TEE WITHOUT CARDIOVERSION N/A 07/08/2015   Procedure: TRANSESOPHAGEAL ECHOCARDIOGRAM (TEE);  Surgeon: Rexene Alberts, MD;  Location: [place];  Service: Open Heart Surgery;  Laterality: N/A;   FHx:    Reviewed  / unchanged  SHx:    Reviewed / unchanged  Systems Review:  Constitutional: Denies fever, chills, wt changes, headaches, insomnia, fatigue, night sweats, change in appetite. Eyes: Denies redness, blurred vision, diplopia, discharge, itchy, watery eyes.  ENT: Denies discharge, congestion, post nasal drip, epistaxis, sore throat, earache, hearing loss, dental pain, tinnitus, vertigo, sinus pain, snoring.  CV: Denies chest pain, palpitations, irregular heartbeat, syncope, dyspnea, diaphoresis,  orthopnea, PND, claudication or edema. Respiratory: denies cough, dyspnea, DOE, pleurisy, hoarseness, laryngitis, wheezing.  Gastrointestinal: Denies dysphagia, odynophagia, heartburn, reflux, water brash, abdominal pain or cramps, nausea, vomiting, bloating, diarrhea, constipation, hematemesis, melena, hematochezia  or hemorrhoids. Genitourinary: Denies dysuria, frequency, urgency, nocturia, hesitancy, discharge, hematuria or flank pain. Musculoskeletal: Denies arthralgias, myalgias, stiffness, jt. swelling, pain, limping or strain/sprain.  Skin: Denies pruritus, rash, hives, warts, acne, eczema or change in skin lesion(s). Neuro: No weakness, tremor, incoordination, spasms, paresthesia or pain. Psychiatric: Denies confusion, memory loss or sensory loss. Endo: Denies change in weight, skin or hair change.  Heme/Lymph: No excessive bleeding, bruising or enlarged lymph nodes.  Physical Exam  BP 120/82   Pulse 74   Temp (!) 97.1 F (36.2 C)   Resp 16   Ht '4\' 11"'$  (1.499 m)   Wt 171 lb 12.8 oz (77.9 kg)   SpO2 97%   BMI 34.70 kg/m   Appears well nourished, well groomed  and in no distress.  Eyes: PERRLA, EOMs, conjunctiva no swelling or erythema. Sinuses: No frontal/maxillary tenderness ENT/Mouth: EAC's clear, TM's nl w/o erythema, bulging. Nares clear w/o erythema, swelling, exudates. Oropharynx clear without erythema or exudates. Oral hygiene is good. Tongue normal, non obstructing. Hearing  intact.  Neck: Supple. Thyroid nl. Car 2+/2+ without bruits, nodes or JVD. Chest: Respirations nl with BS clear & equal w/o rales, rhonchi, wheezing or stridor.  Cor: Heart sounds normal w/ regular rate and rhythm without sig. murmurs, gallops, clicks or rubs. Peripheral pulses normal and equal  without edema.  Abdomen: Soft & bowel sounds normal. Non-tender w/o guarding, rebound, hernias, masses or organomegaly.  Lymphatics: Unremarkable.  Musculoskeletal: Full ROM all peripheral extremities, joint stability, 5/5 strength and normal gait.  Skin: Warm, dry without exposed rashes, lesions or ecchymosis apparent.  Neuro: Cranial nerves intact, reflexes equal bilaterally. Sensory-motor testing grossly intact. Tendon reflexes grossly intact.  Pysch: Alert & oriented x 3.  Insight and judgement nl & appropriate. No ideations.  Assessment and Plan:   1. Chest pain due to myocardial ischemia, unspecified ischemic chest pain type   2. Essential hypertension  - Continue medication, monitor blood pressure at home.  - Continue DASH diet.  Reminder to go to the ER if any CP,  SOB, nausea, dizziness, severe HA, changes vision/speech.   3. Hyperlipidemia associated with type 2 diabetes mellitus ([place])  - Continue diet/meds, exercise,& lifestyle modifications.  - Continue monitor periodic cholesterol/liver & renal functions     4. Type 2 diabetes mellitus with chronic kidney disease on  chronic dialysis, with long-term current use of insulin (HCC)  - Continue diet, exercise, lifestyle modifications.  - Monitor appropriate labs.  5. Obesity (BMI 30.0-34.9)   6. Pulmonary hypertension ([place])   7. Chronic diastolic heart failure ([place])   8. S/P minimally invasive mitral valve repair   9. ESRD on dialysis [facility])       Discussed  regular exercise, BP monitoring, weight control to achieve/maintain BMI less than 25 and discussed meds and SE's. Recommended labs to assess and monitor clinical  status with further disposition pending results of labs. Over 30 minutes of exam, counseling, chart review was performed.   Kirtland Bouchard, MD

## 2021-09-09 DIAGNOSIS — D689 Coagulation defect, unspecified: Secondary | ICD-10-CM | POA: Diagnosis not present

## 2021-09-09 DIAGNOSIS — N2581 Secondary hyperparathyroidism of renal origin: Secondary | ICD-10-CM | POA: Diagnosis not present

## 2021-09-09 DIAGNOSIS — Z23 Encounter for immunization: Secondary | ICD-10-CM | POA: Diagnosis not present

## 2021-09-09 DIAGNOSIS — N186 End stage renal disease: Secondary | ICD-10-CM | POA: Diagnosis not present

## 2021-09-09 DIAGNOSIS — I159 Secondary hypertension, unspecified: Secondary | ICD-10-CM | POA: Diagnosis not present

## 2021-09-09 DIAGNOSIS — Z992 Dependence on renal dialysis: Secondary | ICD-10-CM | POA: Diagnosis not present

## 2021-09-09 DIAGNOSIS — E118 Type 2 diabetes mellitus with unspecified complications: Secondary | ICD-10-CM | POA: Diagnosis not present

## 2021-09-09 DIAGNOSIS — D631 Anemia in chronic kidney disease: Secondary | ICD-10-CM | POA: Diagnosis not present

## 2021-09-10 ENCOUNTER — Encounter: Payer: Self-pay | Admitting: Internal Medicine

## 2021-09-12 ENCOUNTER — Encounter: Payer: Self-pay | Admitting: Cardiology

## 2021-09-12 ENCOUNTER — Other Ambulatory Visit: Payer: Self-pay

## 2021-09-12 ENCOUNTER — Ambulatory Visit (INDEPENDENT_AMBULATORY_CARE_PROVIDER_SITE_OTHER): Payer: Medicare Other | Admitting: Cardiology

## 2021-09-12 ENCOUNTER — Encounter: Payer: Self-pay | Admitting: *Deleted

## 2021-09-12 VITALS — BP 120/78 | HR 90 | Ht 59.0 in | Wt 169.1 lb

## 2021-09-12 DIAGNOSIS — I5032 Chronic diastolic (congestive) heart failure: Secondary | ICD-10-CM | POA: Diagnosis not present

## 2021-09-12 DIAGNOSIS — Z9889 Other specified postprocedural states: Secondary | ICD-10-CM | POA: Diagnosis not present

## 2021-09-12 DIAGNOSIS — N2581 Secondary hyperparathyroidism of renal origin: Secondary | ICD-10-CM | POA: Diagnosis not present

## 2021-09-12 DIAGNOSIS — D631 Anemia in chronic kidney disease: Secondary | ICD-10-CM | POA: Diagnosis not present

## 2021-09-12 DIAGNOSIS — I272 Pulmonary hypertension, unspecified: Secondary | ICD-10-CM

## 2021-09-12 DIAGNOSIS — D689 Coagulation defect, unspecified: Secondary | ICD-10-CM | POA: Diagnosis not present

## 2021-09-12 DIAGNOSIS — N186 End stage renal disease: Secondary | ICD-10-CM | POA: Diagnosis not present

## 2021-09-12 DIAGNOSIS — I10 Essential (primary) hypertension: Secondary | ICD-10-CM | POA: Diagnosis not present

## 2021-09-12 DIAGNOSIS — E118 Type 2 diabetes mellitus with unspecified complications: Secondary | ICD-10-CM | POA: Diagnosis not present

## 2021-09-12 DIAGNOSIS — R072 Precordial pain: Secondary | ICD-10-CM

## 2021-09-12 DIAGNOSIS — Z992 Dependence on renal dialysis: Secondary | ICD-10-CM | POA: Diagnosis not present

## 2021-09-12 DIAGNOSIS — I159 Secondary hypertension, unspecified: Secondary | ICD-10-CM | POA: Diagnosis not present

## 2021-09-12 DIAGNOSIS — Z23 Encounter for immunization: Secondary | ICD-10-CM | POA: Diagnosis not present

## 2021-09-12 NOTE — Patient Instructions (Signed)
  Testing/Procedures:  Your physician has requested that you have a lexiscan myoview. For further information please visit [email or web address]. Please follow instruction sheet, as given. [place]   Follow-Up: At [facility], you and your health needs are our priority.  As part of our continuing mission to provide you with exceptional heart care, we have created designated Provider Care Teams.  These Care Teams include your primary Cardiologist (physician) and Advanced Practice Providers (APPs -  Physician Assistants and Nurse Practitioners) who all work together to provide you with the care you need, when you need it.  We recommend signing up for the patient portal called "MyChart".  Sign up information is provided on this After Visit Summary.  MyChart is used to connect with patients for Virtual Visits (Telemedicine).  Patients are able to view lab/test results, encounter notes, upcoming appointments, etc.  Non-urgent messages can be sent to your provider as well.   To learn more about what you can do with MyChart, go to [email or web address].    Your next appointment:   3 month(s)  The format for your next appointment:   In Person  Provider:   [name], MD

## 2021-09-13 DIAGNOSIS — D689 Coagulation defect, unspecified: Secondary | ICD-10-CM | POA: Diagnosis not present

## 2021-09-13 DIAGNOSIS — D631 Anemia in chronic kidney disease: Secondary | ICD-10-CM | POA: Diagnosis not present

## 2021-09-13 DIAGNOSIS — N2581 Secondary hyperparathyroidism of renal origin: Secondary | ICD-10-CM | POA: Diagnosis not present

## 2021-09-13 DIAGNOSIS — E118 Type 2 diabetes mellitus with unspecified complications: Secondary | ICD-10-CM | POA: Diagnosis not present

## 2021-09-13 DIAGNOSIS — Z23 Encounter for immunization: Secondary | ICD-10-CM | POA: Diagnosis not present

## 2021-09-13 DIAGNOSIS — N186 End stage renal disease: Secondary | ICD-10-CM | POA: Diagnosis not present

## 2021-09-13 DIAGNOSIS — Z992 Dependence on renal dialysis: Secondary | ICD-10-CM | POA: Diagnosis not present

## 2021-09-13 DIAGNOSIS — I159 Secondary hypertension, unspecified: Secondary | ICD-10-CM | POA: Diagnosis not present

## 2021-09-14 ENCOUNTER — Ambulatory Visit: Payer: Medicare Other | Admitting: Adult Health

## 2021-09-14 DIAGNOSIS — D631 Anemia in chronic kidney disease: Secondary | ICD-10-CM | POA: Diagnosis not present

## 2021-09-14 DIAGNOSIS — Z23 Encounter for immunization: Secondary | ICD-10-CM | POA: Diagnosis not present

## 2021-09-14 DIAGNOSIS — E118 Type 2 diabetes mellitus with unspecified complications: Secondary | ICD-10-CM | POA: Diagnosis not present

## 2021-09-14 DIAGNOSIS — N186 End stage renal disease: Secondary | ICD-10-CM | POA: Diagnosis not present

## 2021-09-14 DIAGNOSIS — Z992 Dependence on renal dialysis: Secondary | ICD-10-CM | POA: Diagnosis not present

## 2021-09-14 DIAGNOSIS — I159 Secondary hypertension, unspecified: Secondary | ICD-10-CM | POA: Diagnosis not present

## 2021-09-14 DIAGNOSIS — D689 Coagulation defect, unspecified: Secondary | ICD-10-CM | POA: Diagnosis not present

## 2021-09-14 DIAGNOSIS — N2581 Secondary hyperparathyroidism of renal origin: Secondary | ICD-10-CM | POA: Diagnosis not present

## 2021-09-15 ENCOUNTER — Telehealth (HOSPITAL_COMMUNITY): Payer: Self-pay

## 2021-09-15 NOTE — Telephone Encounter (Signed)
Detailed instructions left on the patient's answering machine. Asked to call back with any questions. S.Virjean Boman EMTP 

## 2021-09-16 DIAGNOSIS — D631 Anemia in chronic kidney disease: Secondary | ICD-10-CM | POA: Diagnosis not present

## 2021-09-16 DIAGNOSIS — N186 End stage renal disease: Secondary | ICD-10-CM | POA: Diagnosis not present

## 2021-09-16 DIAGNOSIS — N2581 Secondary hyperparathyroidism of renal origin: Secondary | ICD-10-CM | POA: Diagnosis not present

## 2021-09-16 DIAGNOSIS — D689 Coagulation defect, unspecified: Secondary | ICD-10-CM | POA: Diagnosis not present

## 2021-09-16 DIAGNOSIS — Z992 Dependence on renal dialysis: Secondary | ICD-10-CM | POA: Diagnosis not present

## 2021-09-16 DIAGNOSIS — Z23 Encounter for immunization: Secondary | ICD-10-CM | POA: Diagnosis not present

## 2021-09-16 DIAGNOSIS — E118 Type 2 diabetes mellitus with unspecified complications: Secondary | ICD-10-CM | POA: Diagnosis not present

## 2021-09-16 DIAGNOSIS — I159 Secondary hypertension, unspecified: Secondary | ICD-10-CM | POA: Diagnosis not present

## 2021-09-19 DIAGNOSIS — D631 Anemia in chronic kidney disease: Secondary | ICD-10-CM | POA: Diagnosis not present

## 2021-09-19 DIAGNOSIS — N186 End stage renal disease: Secondary | ICD-10-CM | POA: Diagnosis not present

## 2021-09-19 DIAGNOSIS — Z23 Encounter for immunization: Secondary | ICD-10-CM | POA: Diagnosis not present

## 2021-09-19 DIAGNOSIS — E118 Type 2 diabetes mellitus with unspecified complications: Secondary | ICD-10-CM | POA: Diagnosis not present

## 2021-09-19 DIAGNOSIS — D689 Coagulation defect, unspecified: Secondary | ICD-10-CM | POA: Diagnosis not present

## 2021-09-19 DIAGNOSIS — Z992 Dependence on renal dialysis: Secondary | ICD-10-CM | POA: Diagnosis not present

## 2021-09-19 DIAGNOSIS — I159 Secondary hypertension, unspecified: Secondary | ICD-10-CM | POA: Diagnosis not present

## 2021-09-19 DIAGNOSIS — N2581 Secondary hyperparathyroidism of renal origin: Secondary | ICD-10-CM | POA: Diagnosis not present

## 2021-09-20 ENCOUNTER — Other Ambulatory Visit: Payer: Self-pay

## 2021-09-20 ENCOUNTER — Ambulatory Visit (HOSPITAL_COMMUNITY): Payer: Medicare Other | Attending: Cardiology

## 2021-09-20 DIAGNOSIS — R072 Precordial pain: Secondary | ICD-10-CM | POA: Diagnosis not present

## 2021-09-20 LAB — MYOCARDIAL PERFUSION IMAGING
LV dias vol: 97 mL (ref 46–106)
LV sys vol: 56 mL
Nuc Stress EF: 42 %
Peak HR: 96 {beats}/min
Rest HR: 85 {beats}/min
Rest Nuclear Isotope Dose: 10.3 mCi
SDS: 3
SRS: 7
SSS: 10
ST Depression (mm): 0 mm
Stress Nuclear Isotope Dose: 31.8 mCi
TID: 1.01

## 2021-09-20 MED ORDER — REGADENOSON 0.4 MG/5ML IV SOLN
0.4000 mg | Freq: Once | INTRAVENOUS | Status: AC
  Administered 2021-09-20: 0.4 mg via INTRAVENOUS

## 2021-09-20 MED ORDER — TECHNETIUM TC 99M TETROFOSMIN IV KIT
10.3000 | PACK | Freq: Once | INTRAVENOUS | Status: AC | PRN
  Administered 2021-09-20: 10.3 via INTRAVENOUS
  Filled 2021-09-20: qty 11

## 2021-09-20 MED ORDER — TECHNETIUM TC 99M TETROFOSMIN IV KIT
31.8000 | PACK | Freq: Once | INTRAVENOUS | Status: AC | PRN
  Administered 2021-09-20: 31.8 via INTRAVENOUS
  Filled 2021-09-20: qty 32

## 2021-09-20 MED ORDER — AMINOPHYLLINE 25 MG/ML IV SOLN
75.0000 mg | Freq: Once | INTRAVENOUS | Status: AC
  Administered 2021-09-20: 75 mg via INTRAVENOUS

## 2021-09-21 DIAGNOSIS — D689 Coagulation defect, unspecified: Secondary | ICD-10-CM | POA: Diagnosis not present

## 2021-09-21 DIAGNOSIS — Z992 Dependence on renal dialysis: Secondary | ICD-10-CM | POA: Diagnosis not present

## 2021-09-21 DIAGNOSIS — N186 End stage renal disease: Secondary | ICD-10-CM | POA: Diagnosis not present

## 2021-09-21 DIAGNOSIS — N2581 Secondary hyperparathyroidism of renal origin: Secondary | ICD-10-CM | POA: Diagnosis not present

## 2021-09-21 DIAGNOSIS — Z23 Encounter for immunization: Secondary | ICD-10-CM | POA: Diagnosis not present

## 2021-09-21 DIAGNOSIS — I159 Secondary hypertension, unspecified: Secondary | ICD-10-CM | POA: Diagnosis not present

## 2021-09-21 DIAGNOSIS — E118 Type 2 diabetes mellitus with unspecified complications: Secondary | ICD-10-CM | POA: Diagnosis not present

## 2021-09-21 DIAGNOSIS — D631 Anemia in chronic kidney disease: Secondary | ICD-10-CM | POA: Diagnosis not present

## 2021-09-23 DIAGNOSIS — D689 Coagulation defect, unspecified: Secondary | ICD-10-CM | POA: Diagnosis not present

## 2021-09-23 DIAGNOSIS — N186 End stage renal disease: Secondary | ICD-10-CM | POA: Diagnosis not present

## 2021-09-23 DIAGNOSIS — E118 Type 2 diabetes mellitus with unspecified complications: Secondary | ICD-10-CM | POA: Diagnosis not present

## 2021-09-23 DIAGNOSIS — N2581 Secondary hyperparathyroidism of renal origin: Secondary | ICD-10-CM | POA: Diagnosis not present

## 2021-09-23 DIAGNOSIS — Z992 Dependence on renal dialysis: Secondary | ICD-10-CM | POA: Diagnosis not present

## 2021-09-23 DIAGNOSIS — I159 Secondary hypertension, unspecified: Secondary | ICD-10-CM | POA: Diagnosis not present

## 2021-09-23 DIAGNOSIS — D631 Anemia in chronic kidney disease: Secondary | ICD-10-CM | POA: Diagnosis not present

## 2021-09-23 DIAGNOSIS — Z23 Encounter for immunization: Secondary | ICD-10-CM | POA: Diagnosis not present

## 2021-09-26 DIAGNOSIS — D631 Anemia in chronic kidney disease: Secondary | ICD-10-CM | POA: Diagnosis not present

## 2021-09-26 DIAGNOSIS — N2581 Secondary hyperparathyroidism of renal origin: Secondary | ICD-10-CM | POA: Diagnosis not present

## 2021-09-26 DIAGNOSIS — Z23 Encounter for immunization: Secondary | ICD-10-CM | POA: Diagnosis not present

## 2021-09-26 DIAGNOSIS — E118 Type 2 diabetes mellitus with unspecified complications: Secondary | ICD-10-CM | POA: Diagnosis not present

## 2021-09-26 DIAGNOSIS — D689 Coagulation defect, unspecified: Secondary | ICD-10-CM | POA: Diagnosis not present

## 2021-09-26 DIAGNOSIS — N186 End stage renal disease: Secondary | ICD-10-CM | POA: Diagnosis not present

## 2021-09-26 DIAGNOSIS — Z992 Dependence on renal dialysis: Secondary | ICD-10-CM | POA: Diagnosis not present

## 2021-09-26 DIAGNOSIS — I159 Secondary hypertension, unspecified: Secondary | ICD-10-CM | POA: Diagnosis not present

## 2021-09-28 DIAGNOSIS — N186 End stage renal disease: Secondary | ICD-10-CM | POA: Diagnosis not present

## 2021-09-28 DIAGNOSIS — D689 Coagulation defect, unspecified: Secondary | ICD-10-CM | POA: Diagnosis not present

## 2021-09-28 DIAGNOSIS — E118 Type 2 diabetes mellitus with unspecified complications: Secondary | ICD-10-CM | POA: Diagnosis not present

## 2021-09-28 DIAGNOSIS — D631 Anemia in chronic kidney disease: Secondary | ICD-10-CM | POA: Diagnosis not present

## 2021-09-28 DIAGNOSIS — N2581 Secondary hyperparathyroidism of renal origin: Secondary | ICD-10-CM | POA: Diagnosis not present

## 2021-09-28 DIAGNOSIS — Z992 Dependence on renal dialysis: Secondary | ICD-10-CM | POA: Diagnosis not present

## 2021-09-30 DIAGNOSIS — E118 Type 2 diabetes mellitus with unspecified complications: Secondary | ICD-10-CM | POA: Diagnosis not present

## 2021-09-30 DIAGNOSIS — N186 End stage renal disease: Secondary | ICD-10-CM | POA: Diagnosis not present

## 2021-09-30 DIAGNOSIS — D689 Coagulation defect, unspecified: Secondary | ICD-10-CM | POA: Diagnosis not present

## 2021-09-30 DIAGNOSIS — D631 Anemia in chronic kidney disease: Secondary | ICD-10-CM | POA: Diagnosis not present

## 2021-09-30 DIAGNOSIS — N2581 Secondary hyperparathyroidism of renal origin: Secondary | ICD-10-CM | POA: Diagnosis not present

## 2021-09-30 DIAGNOSIS — Z992 Dependence on renal dialysis: Secondary | ICD-10-CM | POA: Diagnosis not present

## 2021-10-03 DIAGNOSIS — N2581 Secondary hyperparathyroidism of renal origin: Secondary | ICD-10-CM | POA: Diagnosis not present

## 2021-10-03 DIAGNOSIS — D689 Coagulation defect, unspecified: Secondary | ICD-10-CM | POA: Diagnosis not present

## 2021-10-03 DIAGNOSIS — E118 Type 2 diabetes mellitus with unspecified complications: Secondary | ICD-10-CM | POA: Diagnosis not present

## 2021-10-03 DIAGNOSIS — Z992 Dependence on renal dialysis: Secondary | ICD-10-CM | POA: Diagnosis not present

## 2021-10-03 DIAGNOSIS — D631 Anemia in chronic kidney disease: Secondary | ICD-10-CM | POA: Diagnosis not present

## 2021-10-03 DIAGNOSIS — N186 End stage renal disease: Secondary | ICD-10-CM | POA: Diagnosis not present

## 2021-10-05 ENCOUNTER — Telehealth: Payer: Self-pay | Admitting: Internal Medicine

## 2021-10-05 DIAGNOSIS — N2581 Secondary hyperparathyroidism of renal origin: Secondary | ICD-10-CM | POA: Diagnosis not present

## 2021-10-05 DIAGNOSIS — D631 Anemia in chronic kidney disease: Secondary | ICD-10-CM | POA: Diagnosis not present

## 2021-10-05 DIAGNOSIS — E118 Type 2 diabetes mellitus with unspecified complications: Secondary | ICD-10-CM | POA: Diagnosis not present

## 2021-10-05 DIAGNOSIS — Z992 Dependence on renal dialysis: Secondary | ICD-10-CM | POA: Diagnosis not present

## 2021-10-05 DIAGNOSIS — D689 Coagulation defect, unspecified: Secondary | ICD-10-CM | POA: Diagnosis not present

## 2021-10-05 DIAGNOSIS — N186 End stage renal disease: Secondary | ICD-10-CM | POA: Diagnosis not present

## 2021-10-05 NOTE — Chronic Care Management (AMB) (Signed)
  Chronic Care Management   Outreach Note  10/05/2021 Name: Jody Nguyen MRN: [id number] DOB: 1956/01/23  Referred by: Unk Pinto, MD Reason for referral : No chief complaint on file.   An unsuccessful telephone outreach was attempted today. The patient was referred to the pharmacist for assistance with care management and care coordination.   Follow Up Plan:   Tatjana Dellinger Upstream Scheduler

## 2021-10-05 NOTE — Chronic Care Management (AMB) (Signed)
  Chronic Care Management   Note  10/05/2021 Name: [name] MRN: [id number] DOB: 30-Jun-1956  Jody Nguyen is a 65 y.o. year old female who is a primary care patient of Unk Pinto, MD. I reached out to Rosanne Sack by phone today in response to a referral sent by [name]'s PCP, Unk Pinto, MD.   [name] was given information about Chronic Care Management services today including:  CCM service includes personalized support from designated clinical staff supervised by her physician, including individualized plan of care and coordination with other care providers 24/7 contact phone numbers for assistance for urgent and routine care needs. Service will only be billed when office clinical staff spend 20 minutes or more in a month to coordinate care. Only one practitioner may furnish and bill the service in a calendar month. The patient may stop CCM services at any time (effective at the end of the month) by phone call to the office staff.   Patient agreed to services and verbal consent obtained.   Follow up plan:   [name]

## 2021-10-07 DIAGNOSIS — D631 Anemia in chronic kidney disease: Secondary | ICD-10-CM | POA: Diagnosis not present

## 2021-10-07 DIAGNOSIS — N186 End stage renal disease: Secondary | ICD-10-CM | POA: Diagnosis not present

## 2021-10-07 DIAGNOSIS — D689 Coagulation defect, unspecified: Secondary | ICD-10-CM | POA: Diagnosis not present

## 2021-10-07 DIAGNOSIS — E118 Type 2 diabetes mellitus with unspecified complications: Secondary | ICD-10-CM | POA: Diagnosis not present

## 2021-10-07 DIAGNOSIS — N2581 Secondary hyperparathyroidism of renal origin: Secondary | ICD-10-CM | POA: Diagnosis not present

## 2021-10-07 DIAGNOSIS — Z992 Dependence on renal dialysis: Secondary | ICD-10-CM | POA: Diagnosis not present

## 2021-10-10 DIAGNOSIS — E118 Type 2 diabetes mellitus with unspecified complications: Secondary | ICD-10-CM | POA: Diagnosis not present

## 2021-10-10 DIAGNOSIS — D689 Coagulation defect, unspecified: Secondary | ICD-10-CM | POA: Diagnosis not present

## 2021-10-10 DIAGNOSIS — Z992 Dependence on renal dialysis: Secondary | ICD-10-CM | POA: Diagnosis not present

## 2021-10-10 DIAGNOSIS — N2581 Secondary hyperparathyroidism of renal origin: Secondary | ICD-10-CM | POA: Diagnosis not present

## 2021-10-10 DIAGNOSIS — N186 End stage renal disease: Secondary | ICD-10-CM | POA: Diagnosis not present

## 2021-10-10 DIAGNOSIS — D631 Anemia in chronic kidney disease: Secondary | ICD-10-CM | POA: Diagnosis not present

## 2021-10-12 DIAGNOSIS — Z992 Dependence on renal dialysis: Secondary | ICD-10-CM | POA: Diagnosis not present

## 2021-10-12 DIAGNOSIS — E118 Type 2 diabetes mellitus with unspecified complications: Secondary | ICD-10-CM | POA: Diagnosis not present

## 2021-10-12 DIAGNOSIS — D689 Coagulation defect, unspecified: Secondary | ICD-10-CM | POA: Diagnosis not present

## 2021-10-12 DIAGNOSIS — N2581 Secondary hyperparathyroidism of renal origin: Secondary | ICD-10-CM | POA: Diagnosis not present

## 2021-10-12 DIAGNOSIS — D631 Anemia in chronic kidney disease: Secondary | ICD-10-CM | POA: Diagnosis not present

## 2021-10-12 DIAGNOSIS — N186 End stage renal disease: Secondary | ICD-10-CM | POA: Diagnosis not present

## 2021-10-14 DIAGNOSIS — D689 Coagulation defect, unspecified: Secondary | ICD-10-CM | POA: Diagnosis not present

## 2021-10-14 DIAGNOSIS — Z992 Dependence on renal dialysis: Secondary | ICD-10-CM | POA: Diagnosis not present

## 2021-10-14 DIAGNOSIS — N186 End stage renal disease: Secondary | ICD-10-CM | POA: Diagnosis not present

## 2021-10-14 DIAGNOSIS — N2581 Secondary hyperparathyroidism of renal origin: Secondary | ICD-10-CM | POA: Diagnosis not present

## 2021-10-14 DIAGNOSIS — E118 Type 2 diabetes mellitus with unspecified complications: Secondary | ICD-10-CM | POA: Diagnosis not present

## 2021-10-14 DIAGNOSIS — D631 Anemia in chronic kidney disease: Secondary | ICD-10-CM | POA: Diagnosis not present

## 2021-10-17 DIAGNOSIS — Z992 Dependence on renal dialysis: Secondary | ICD-10-CM | POA: Diagnosis not present

## 2021-10-17 DIAGNOSIS — N2581 Secondary hyperparathyroidism of renal origin: Secondary | ICD-10-CM | POA: Diagnosis not present

## 2021-10-17 DIAGNOSIS — E118 Type 2 diabetes mellitus with unspecified complications: Secondary | ICD-10-CM | POA: Diagnosis not present

## 2021-10-17 DIAGNOSIS — D689 Coagulation defect, unspecified: Secondary | ICD-10-CM | POA: Diagnosis not present

## 2021-10-17 DIAGNOSIS — D631 Anemia in chronic kidney disease: Secondary | ICD-10-CM | POA: Diagnosis not present

## 2021-10-17 DIAGNOSIS — N186 End stage renal disease: Secondary | ICD-10-CM | POA: Diagnosis not present

## 2021-10-19 DIAGNOSIS — Z992 Dependence on renal dialysis: Secondary | ICD-10-CM | POA: Diagnosis not present

## 2021-10-19 DIAGNOSIS — E118 Type 2 diabetes mellitus with unspecified complications: Secondary | ICD-10-CM | POA: Diagnosis not present

## 2021-10-19 DIAGNOSIS — D631 Anemia in chronic kidney disease: Secondary | ICD-10-CM | POA: Diagnosis not present

## 2021-10-19 DIAGNOSIS — N186 End stage renal disease: Secondary | ICD-10-CM | POA: Diagnosis not present

## 2021-10-19 DIAGNOSIS — N2581 Secondary hyperparathyroidism of renal origin: Secondary | ICD-10-CM | POA: Diagnosis not present

## 2021-10-19 DIAGNOSIS — D689 Coagulation defect, unspecified: Secondary | ICD-10-CM | POA: Diagnosis not present

## 2021-10-22 DIAGNOSIS — D631 Anemia in chronic kidney disease: Secondary | ICD-10-CM | POA: Diagnosis not present

## 2021-10-22 DIAGNOSIS — E118 Type 2 diabetes mellitus with unspecified complications: Secondary | ICD-10-CM | POA: Diagnosis not present

## 2021-10-22 DIAGNOSIS — D689 Coagulation defect, unspecified: Secondary | ICD-10-CM | POA: Diagnosis not present

## 2021-10-22 DIAGNOSIS — N186 End stage renal disease: Secondary | ICD-10-CM | POA: Diagnosis not present

## 2021-10-22 DIAGNOSIS — N2581 Secondary hyperparathyroidism of renal origin: Secondary | ICD-10-CM | POA: Diagnosis not present

## 2021-10-22 DIAGNOSIS — Z992 Dependence on renal dialysis: Secondary | ICD-10-CM | POA: Diagnosis not present

## 2021-10-24 DIAGNOSIS — D631 Anemia in chronic kidney disease: Secondary | ICD-10-CM | POA: Diagnosis not present

## 2021-10-24 DIAGNOSIS — N186 End stage renal disease: Secondary | ICD-10-CM | POA: Diagnosis not present

## 2021-10-24 DIAGNOSIS — D689 Coagulation defect, unspecified: Secondary | ICD-10-CM | POA: Diagnosis not present

## 2021-10-24 DIAGNOSIS — N2581 Secondary hyperparathyroidism of renal origin: Secondary | ICD-10-CM | POA: Diagnosis not present

## 2021-10-24 DIAGNOSIS — E118 Type 2 diabetes mellitus with unspecified complications: Secondary | ICD-10-CM | POA: Diagnosis not present

## 2021-10-24 DIAGNOSIS — Z992 Dependence on renal dialysis: Secondary | ICD-10-CM | POA: Diagnosis not present

## 2021-10-26 DIAGNOSIS — D631 Anemia in chronic kidney disease: Secondary | ICD-10-CM | POA: Diagnosis not present

## 2021-10-26 DIAGNOSIS — Z992 Dependence on renal dialysis: Secondary | ICD-10-CM | POA: Diagnosis not present

## 2021-10-26 DIAGNOSIS — D689 Coagulation defect, unspecified: Secondary | ICD-10-CM | POA: Diagnosis not present

## 2021-10-26 DIAGNOSIS — E118 Type 2 diabetes mellitus with unspecified complications: Secondary | ICD-10-CM | POA: Diagnosis not present

## 2021-10-26 DIAGNOSIS — N2581 Secondary hyperparathyroidism of renal origin: Secondary | ICD-10-CM | POA: Diagnosis not present

## 2021-10-26 DIAGNOSIS — E1129 Type 2 diabetes mellitus with other diabetic kidney complication: Secondary | ICD-10-CM | POA: Diagnosis not present

## 2021-10-26 DIAGNOSIS — N186 End stage renal disease: Secondary | ICD-10-CM | POA: Diagnosis not present

## 2021-10-28 DIAGNOSIS — D689 Coagulation defect, unspecified: Secondary | ICD-10-CM | POA: Diagnosis not present

## 2021-10-28 DIAGNOSIS — D631 Anemia in chronic kidney disease: Secondary | ICD-10-CM | POA: Diagnosis not present

## 2021-10-28 DIAGNOSIS — E118 Type 2 diabetes mellitus with unspecified complications: Secondary | ICD-10-CM | POA: Diagnosis not present

## 2021-10-28 DIAGNOSIS — Z992 Dependence on renal dialysis: Secondary | ICD-10-CM | POA: Diagnosis not present

## 2021-10-28 DIAGNOSIS — N186 End stage renal disease: Secondary | ICD-10-CM | POA: Diagnosis not present

## 2021-10-28 DIAGNOSIS — N2581 Secondary hyperparathyroidism of renal origin: Secondary | ICD-10-CM | POA: Diagnosis not present

## 2021-10-31 DIAGNOSIS — E118 Type 2 diabetes mellitus with unspecified complications: Secondary | ICD-10-CM | POA: Diagnosis not present

## 2021-10-31 DIAGNOSIS — D631 Anemia in chronic kidney disease: Secondary | ICD-10-CM | POA: Diagnosis not present

## 2021-10-31 DIAGNOSIS — D689 Coagulation defect, unspecified: Secondary | ICD-10-CM | POA: Diagnosis not present

## 2021-10-31 DIAGNOSIS — Z992 Dependence on renal dialysis: Secondary | ICD-10-CM | POA: Diagnosis not present

## 2021-10-31 DIAGNOSIS — N186 End stage renal disease: Secondary | ICD-10-CM | POA: Diagnosis not present

## 2021-10-31 DIAGNOSIS — N2581 Secondary hyperparathyroidism of renal origin: Secondary | ICD-10-CM | POA: Diagnosis not present

## 2021-11-02 DIAGNOSIS — D689 Coagulation defect, unspecified: Secondary | ICD-10-CM | POA: Diagnosis not present

## 2021-11-02 DIAGNOSIS — D631 Anemia in chronic kidney disease: Secondary | ICD-10-CM | POA: Diagnosis not present

## 2021-11-02 DIAGNOSIS — E118 Type 2 diabetes mellitus with unspecified complications: Secondary | ICD-10-CM | POA: Diagnosis not present

## 2021-11-02 DIAGNOSIS — N186 End stage renal disease: Secondary | ICD-10-CM | POA: Diagnosis not present

## 2021-11-02 DIAGNOSIS — N2581 Secondary hyperparathyroidism of renal origin: Secondary | ICD-10-CM | POA: Diagnosis not present

## 2021-11-02 DIAGNOSIS — Z992 Dependence on renal dialysis: Secondary | ICD-10-CM | POA: Diagnosis not present

## 2021-11-03 ENCOUNTER — Ambulatory Visit: Payer: Medicare Other | Admitting: Pharmacist

## 2021-11-03 ENCOUNTER — Other Ambulatory Visit: Payer: Self-pay

## 2021-11-03 DIAGNOSIS — N186 End stage renal disease: Secondary | ICD-10-CM

## 2021-11-03 DIAGNOSIS — I770 Arteriovenous fistula, acquired: Secondary | ICD-10-CM

## 2021-11-03 DIAGNOSIS — F325 Major depressive disorder, single episode, in full remission: Secondary | ICD-10-CM

## 2021-11-03 DIAGNOSIS — E1169 Type 2 diabetes mellitus with other specified complication: Secondary | ICD-10-CM

## 2021-11-03 DIAGNOSIS — I5032 Chronic diastolic (congestive) heart failure: Secondary | ICD-10-CM

## 2021-11-03 DIAGNOSIS — I480 Paroxysmal atrial fibrillation: Secondary | ICD-10-CM

## 2021-11-03 DIAGNOSIS — I10 Essential (primary) hypertension: Secondary | ICD-10-CM

## 2021-11-03 DIAGNOSIS — E1122 Type 2 diabetes mellitus with diabetic chronic kidney disease: Secondary | ICD-10-CM

## 2021-11-03 DIAGNOSIS — E669 Obesity, unspecified: Secondary | ICD-10-CM

## 2021-11-03 DIAGNOSIS — Z794 Long term (current) use of insulin: Secondary | ICD-10-CM

## 2021-11-04 DIAGNOSIS — E118 Type 2 diabetes mellitus with unspecified complications: Secondary | ICD-10-CM | POA: Diagnosis not present

## 2021-11-04 DIAGNOSIS — N186 End stage renal disease: Secondary | ICD-10-CM | POA: Diagnosis not present

## 2021-11-04 DIAGNOSIS — Z992 Dependence on renal dialysis: Secondary | ICD-10-CM | POA: Diagnosis not present

## 2021-11-04 DIAGNOSIS — D631 Anemia in chronic kidney disease: Secondary | ICD-10-CM | POA: Diagnosis not present

## 2021-11-04 DIAGNOSIS — N2581 Secondary hyperparathyroidism of renal origin: Secondary | ICD-10-CM | POA: Diagnosis not present

## 2021-11-04 DIAGNOSIS — D689 Coagulation defect, unspecified: Secondary | ICD-10-CM | POA: Diagnosis not present

## 2021-11-07 DIAGNOSIS — E118 Type 2 diabetes mellitus with unspecified complications: Secondary | ICD-10-CM | POA: Diagnosis not present

## 2021-11-07 DIAGNOSIS — N186 End stage renal disease: Secondary | ICD-10-CM | POA: Diagnosis not present

## 2021-11-07 DIAGNOSIS — Z992 Dependence on renal dialysis: Secondary | ICD-10-CM | POA: Diagnosis not present

## 2021-11-07 DIAGNOSIS — D631 Anemia in chronic kidney disease: Secondary | ICD-10-CM | POA: Diagnosis not present

## 2021-11-07 DIAGNOSIS — D689 Coagulation defect, unspecified: Secondary | ICD-10-CM | POA: Diagnosis not present

## 2021-11-07 DIAGNOSIS — N2581 Secondary hyperparathyroidism of renal origin: Secondary | ICD-10-CM | POA: Diagnosis not present

## 2021-11-09 DIAGNOSIS — D631 Anemia in chronic kidney disease: Secondary | ICD-10-CM | POA: Diagnosis not present

## 2021-11-09 DIAGNOSIS — N186 End stage renal disease: Secondary | ICD-10-CM | POA: Diagnosis not present

## 2021-11-09 DIAGNOSIS — Z992 Dependence on renal dialysis: Secondary | ICD-10-CM | POA: Diagnosis not present

## 2021-11-09 DIAGNOSIS — D689 Coagulation defect, unspecified: Secondary | ICD-10-CM | POA: Diagnosis not present

## 2021-11-09 DIAGNOSIS — N2581 Secondary hyperparathyroidism of renal origin: Secondary | ICD-10-CM | POA: Diagnosis not present

## 2021-11-09 DIAGNOSIS — E118 Type 2 diabetes mellitus with unspecified complications: Secondary | ICD-10-CM | POA: Diagnosis not present

## 2021-11-11 DIAGNOSIS — E118 Type 2 diabetes mellitus with unspecified complications: Secondary | ICD-10-CM | POA: Diagnosis not present

## 2021-11-11 DIAGNOSIS — N186 End stage renal disease: Secondary | ICD-10-CM | POA: Diagnosis not present

## 2021-11-11 DIAGNOSIS — N2581 Secondary hyperparathyroidism of renal origin: Secondary | ICD-10-CM | POA: Diagnosis not present

## 2021-11-11 DIAGNOSIS — D689 Coagulation defect, unspecified: Secondary | ICD-10-CM | POA: Diagnosis not present

## 2021-11-11 DIAGNOSIS — Z992 Dependence on renal dialysis: Secondary | ICD-10-CM | POA: Diagnosis not present

## 2021-11-11 DIAGNOSIS — D631 Anemia in chronic kidney disease: Secondary | ICD-10-CM | POA: Diagnosis not present

## 2021-11-11 NOTE — Progress Notes (Signed)
[facility] - Patient Visit with Chart Prep (v 2.6.0)  Jody Nguyen, Jody Nguyen [id number] 18 years, Female  DOB: February 18, 1956  M: [phone] Care Team: Imogene Burn, Trader    __________________________________________________ Summary: Jody Nguyen is a friendly 65 year old lady who presents for CCM initial phone visit. She has no chief complaint today.  Recommendations/Changes made from today's visit: Recommend decreasing Rosuvastatin to 10mg  QD and initiating Ezetemibe 10mg  for LDL control. Although LDL elevated,  safety concerns with Rosuvastatin 20mg  QD in ESRD.  Recommended patient try Melatonin 3mg  HS for insomnia   Patient scheduled for CCM visit with the clinical pharmacist.  Patient is referred for CCM by their PCP and CPP is under general PCP supervision.: At least 2 of these conditions are expected to last 12 months or longer and patient is at significant risk for acute exacerbations and/or functional decline.  Patient has consented to participation in [facility] program. Visit Type: Phone Call Date of Upcoming Visit: 11/03/2021 . Patient Chart Prep (HC) . Chronic Conditions Patient's Chronic Conditions: Hypertension (HTN), Diastolic Heart Failure / HFpEF, Atrial Fibrillation, Other, Hyperlipidemia/Dyslipidemia (HLD), Diabetes (DM), Anemia, Depression, Chronic Kidney Disease (CKD), Insomnia, Periodontitis, Renal Hyperparathyroidism, ESRD on dialysis, Vitamin D deficiency . Doctor and Hospital Visits Were there PCP Visits in last 6 months?: Yes Visit #1: 09/08/2021- Dr. Melford Aase- Patient was seen for hospitalization follow-up. No medication changes.  Were there Specialist Visits in last 6 months?: Yes Visit #1: 09/12/2021- Dr. Stanford Breed (cardiology)- Patient was seen for a follow-up. No medication changes.  Was there a Hospital Visit in last 30 days?: No Were there other Hospital Visits in last 6 months?: Yes Visit #1: 10/2-10/01/2021- Patient was seen for chest pain. No medication  changes.  . Medication Information Are there any Medication discrepancies?: No Are there any Medication adherence gaps (beyond 5 days past due)?: No Medication adherence rates for the STAR rating drugs: losartan (COZAAR) 50 MG tablet 90 tablet 1 03/19/2020    Sig: Take 1 tablet Daily for BP  rosuvastatin (CRESTOR) 20 MG tablet 90 tablet 3 03/19/2020   Sig: Take 1 tablet Daily for Cholesterol    List Patient's current Care Gaps: Need Breast Cancer Screening, Need A1c Documented . Pre-Call Questions [facility]) Are you able to connect with Patient: No Were you able to leave a message?: Yes Relevant details: left detailed vm of appt reminder . Disease Assessments . Subjective Information Current BP: 120/78 Current HR: 90 taken on: 09/11/2021 Previous BP: 120/82 Previous HR: 74 taken on: 09/07/2021 Weight: 169 BMI: 34.14 Last GFR: 6 taken on: 09/05/2021 Why did the patient present?: CCM initial visit Marital status?: Married Details: Did not Specify Retired? Previous work?: Unemployed; used to work as a Statistician does the patient do during the day?: Sunday: Theodoro Kos, family time on sundays M: Dialysis 5am-9am. Shower, eats then sleeps T: Doctor appointments. Housework W: Dialysis Th: Personal days, nail and hair Fri: Dialysis  Saturday: Shopping, errands Who does the patient spend their time with and what do they do?: Husband cooks.. Daughter helps her with anything she needs. Lifestyle habits such as diet and exercise?: Diet: Boiled eggs, cereal for breakfast. Lunch: will usually have lunch outside. Cabbage, baked chicken with ham. Evening: a Sandwich.  Exercise: Walking on back porch, around the house. walk up to mailbox which is at the end of her street. This wears her out so she will do this once a week Alcohol, tobacco, and illicit drug usage?: No What is the patient's  sleep pattern?: Other (with free form text), Trouble staying asleep, Trouble going back  to sleep after waking up Details: Sleeps around 7:30 pm and wake up once and get comfy in the bed and fall back asleep. wake back up at 2am, then drinks coffee and get ready for dialysis. LEave at 4:30am. due to dialysis How many hours per night does patient typically sleep?: 5 hours dim light with TV Patient pleased with health care they are receiving?: Yes Family, occupational, and living circumstances relevant to overall health?: Mother (Deceased at age 65)  Stroke   Father (Other)  Sister (Deceased)  Cancer breast cancer  Sister  Breast cancer   Brother  Heart attack MI in his 40s Heart disease before age 85 Factors that may affect medication adherence?: Complexity of med regimen Name and location of Current pharmacy: [facility] ([place]), [place] - [address] (Ph: [phone]) Current Rx insurance plan: [employer] Are meds synced by current pharmacy?: No Are meds delivered by current pharmacy?: No - delivery available but patient prefers to not use Would patient benefit from direct intervention of clinical lead in dispensing process to optimize clinical outcomes?: Yes Are [facility] pharmacy services available where patient lives?: Yes Is patient disadvantaged to use [facility]?: No [facility] services reviewed with patient and patient wishes to change pharmacy?: No Select reason patient declined to change pharmacies: Patient preference Does patient experience delays in picking up medications due to transportation concerns (getting to pharmacy)?: No Any additional demeanor/mood notes?: Jody Nguyen is a friendly 66 year old lady who presents for CCM initial phone visit. She has no chief complaint today. . Hypertension (HTN) Assess this condition today?: Yes Is patient able to obtain BP reading today?: Yes BP today is: 137/71 Heart Rate is: 82 Goal: <130/80 mmHG Hypertension Stage: Stage 1 (SBP: 130-139 or DBP: 80-89) Is Patient checking BP at  home?: Yes Pt. home BP readings are ranging: 120s/70s How often does patient miss taking their blood pressure medications?: Never Has patient experienced hypotension, dizziness, falls or bradycardia?: No Check present secondary causes (below) for HTN: Obesity, CKD BP RPM device: Does patient qualify?: Yes We discussed: Reducing the amount of salt intake to '1500mg'$ /per day., Other (provide details below), DASH diet:  following a diet emphasizing fruits and vegetables and low-fat dairy products along with whole grains, fish, poultry, and nuts. Reducing red meats and sugars. Details: Push yourself to engage in physical activity as tolerated Assessment:: Controlled Drug: Hydralazine $RemoveBeforeDEI'50mg'GLgMRIWlPyXTJyOt$  TID Assessment: Appropriate, Effective, Safe, Accessible Drug: Carvedilol 12.5 BID Assessment: Appropriate, Effective, Safe, Accessible Drug: Losartan $RemoveBefore'50mg'COwRuvozDDBsd$  QD Assessment: Appropriate, Effective, Safe, Accessible HC Follow up: N/A Pharmacist Follow up: Continue to monitor, CMP, especially while on losartan with ESRD. (K+, SCr), monitor for low BP, s/s . Hyperlipidemia/Dyslipidemia (HLD) Last Lipid panel on: 04/20/2021 TC (Goal<200): 243 LDL: 162 HDL (Goal>40): 47 TG (Goal<150): 181 ASCVD 10-year risk?is:: High (>20%) ASCVD Risk Score: 22.6% Assess this condition today?: Yes LDL Goal: <70 Has patient tried and failed any HLD Medications?: No Does patient have any of the following diagnosis codes that will exclude them from statin therapy?: End Stage Renal Disease (ERD) - N18.6 Verified ICD-10 code N18.6 is submitted to EMR: Yes Check present secondary causes (below) that can lead to increased cholesterol levels (multi-choice optional): Beta blockers, CKD, Diabetes We discussed: How to reduce cholesterol through diet/weight management and physical activity., How a diet high in plant sterols (fruits/vegetables/nuts/whole grains/legumes) may reduce your cholesterol., Encouraged increasing fiber to  a daily  intake of 10-25g/day Assessment:: Uncontrolled Drug: Rosuvastatin 20mg  QD Assessment: Appropriate, Effective, Query Safety Additional Info: Max dose recommended for Rosuvastatin in patients with ESRD is 10mg , consider decreasing to 10mg  daily Plan to (other): Reach out to provider to switch from Rosuvastatin to Atorvastatin 80mg  HC Follow up: N/A Pharmacist Follow up: Recommend switch to Atorvastatin 80mg  QD. Monitor for signs and symptoms of rhabdo with increased dose. LFTs . Diabetes (DM) Current A1C: 5.0 taken on: 04/20/2021 Previous A1C: 5.7 taken on: 12/07/2020 Type: 2 Last DM Eye Exam on: 04/04/2016 Assess this condition today?: Yes Goal A1C: < 7.0 % Type: 2 Is Patient taking statin medication?: Yes Is patient taking ACEi / ARB?: Yes DM RPM device: Does patient qualify?: Yes Patient checking Blood Sugar at home?: Yes Does patient use a Continuous Glucose Monitoring (CGM) device?: No How often does patient check Blood Sugars?: 2 times per month Recent fasting Blood sugar reading: 125-130 Has patient experienced any hypoglycemic episodes?: No Diet recall discussed: No We discussed: How to recognize and treat signs of hypoglycemia., Modifying lifestyle, including to participate in moderate physical activity (e.g., walking) at least 150 minutes per week., Low carbohydrate eating plan with an emphasis on whole grains, legumes, nuts, fruits, and vegetables and minimal refined and processed foods., Other Details: Congratulated patient on maintaining at goal A1c Assessment:: Controlled Drug: None Assessment: Query need HC Follow up: N/A Pharmacist Follow up: Continue to asses FBG and A1c . Depression Previous PHQ-9 Score: 0 (09/10/2021) Assess this condition today?: Yes Completing the PHQ-9 Questionnaire today?: Yes PHQ-9: Over the last 2 weeks, how often have you been bothered by the following problems?: Done Little interest or pleasure in doing things: 0 (Not at all) Feeling  down, depressed, or hopeless: 0 (Not at all) Trouble falling or staying asleep, or sleeping too much: 1 (several days) Feeling tired or having little energy: 1 (several days) Poor appetite or overeating: 0 (Not at all) Feeling bad about yourself  or that you are a failure or have let yourself or your family down: 0 (Not at all) Trouble concentrating on things, such as reading the newspaper or watching television: 0 (Not at all) Moving or speaking so slowly that other people could have noticed? Or the opposite - being so fidgety or restless that you have been moving around a to more than usual: 0 (Not at all) Thoughts that you would be better off dead or of hurting yourself in some way: 0 (Not at all) Total PHQ-9 Score (please total responses for questions  above): 2 Depression Severity: Minimal / none (Score: 0-4) In your opinion, how do you feel your depression symptoms have been controlled over the past 3 months?: Stable / stayed the same Assessment:: Controlled Drug: Sertraline 25mg  QD Assessment: Appropriate, Effective, Safe, Accessible HC Follow up: N/A Pharmacist Follow up: Continue to assess for s/s of worsening mood. Follow-up PHQ-05 May 2022 . Atrial Fibrillation Current INR: N/A Assess this condition today?: Yes CHADS2-VASc: Congestive Heart Failure (1), Hypertension (1), Diabetes Mellitus (1), Age 62 to 24 years (1), Sex Category - female (1) CHADS2-VASc Score: 5 HAS-BLED: Renal Disease (1), Age 40 years or older (1), Medication Usage Predisposing to Bleeding (1) HAS-BLED Score: 3 Type of control: Rate controlled Other factors present that may impact atrial fibrillation control: Caffeine intake We discussed: Signs and symptoms of bleeding associated with anticoagulant use, Importance of adherence with anticoagulant therapy and regular monitoring Assessment:: Controlled Drug: Eliquis 5mg  BID Assessment: Appropriate, Effective, Safe, Accessible  Drug: Carvedilol 25 mg  QD Assessment: Appropriate, Effective, Safe, Accessible HC Follow up: N/A Pharmacist Follow up: Pt to continue following with cardiology . Chronic kidney disease (CKD) Previous GFR: 6 taken on: 09/05/2021 The current microalbumin ratio is: 754 tested on: 04/15/2015 Assess this condition today?: Yes CKD Stage: Stage 5 (GFR <15 mL/min) Albuminuria Stage: A3 (>300) Contributing factors for developing CKD: Diabetes, HTN, Proteinuria Does patient see nephrologist?: Yes Name/Location and next visit with nephrologist: Dr. Joelyn Oms Is patient on dialysis?: Yes Type of Dialisys: Hemodialysis Dialysis location and schedule: MWF from 5am-9am Access Type?: Fistula Are there any access complications?: None Is the patient experiencing any of the following symptoms?: None Is Patient taking statin medication: Yes Is patient taking ACEi / ARB?: Yes Renal dose adjustments recommended?: Yes Details: Recommend obtaining lipid panel at next visit, and decreasing Rosuvastatin to $RemoveBeforeD'10mg'RqvuKJSGztgXaJ$  QD  We discussed: Limiting dietary sodium intake to less than 2000 mg / day, Maintaining blood pressure control, Maintaining blood glucose control, Avoidance of nephrotoxic drugs (NSAIDs) Assessment:: Controlled Drug: Losartan $RemoveBefore'50mg'KSngqYrqREsZu$  QD Assessment: Appropriate, Effective, Safe, Accessible Drug: Renvela $RemoveBefor'800mg'uoniLJlqpeGw$  take 2 capsules with every meal Assessment: Appropriate, Effective, Safe, Accessible Drug: Sensipar $RemoveBefore'60mg'iCWBZrUfXOXfa$  QD Assessment: Appropriate, Effective, Safe, Accessible HC Follow up: N/A Pharmacist Follow up: Closely follow SCr, GFR, adjust meds as needed . Insomnia Assess this condition today?: Yes Patient has following issues with sleeping: Trouble staying asleep, Waking up too early What time do you typically go to bed?: 7:30pm to 8pm How long does it typically take you to fall asleep?: Not long What time do you typically wake up?: Wakes up once at midnight Do you nap during the day?: Yes How long do you typically  nap?: 1-2hours after dialysis What changes have you made to your bedtime routine to help with sleep?: Going to bed earlier We discussed: Avoiding naps during the day to help sleep longer at night, Ensuring room is cool and dark when trying to sleep, Avoiding screens (TV, phone, tablets) prior to bedtime Assessment:: Uncontrolled Drug: None Assessment: Query need Plan to Start: Melatonin $RemoveBeforeDE'3mg'UnveZHuPMFfFkti$  [facility]HS HC Follow up: N/A Pharmacist Follow up: Will follow up with patient at next visit to assess if melatonin aid with sleep . Diastolic Heart Failure / HFpEF Last Echo with EF: 55-60% taken on: 05/05/2015 Last BNP: 940 taken on: 09/05/2021 Assess this condition today?: Yes Reviewed last eGFR/SCr: Yes Reviewed last Potassium lab: Yes NYHA Class: I (no activity limitation) ACC/AHA stage: B (Heart disease present - no symptoms present) Patient checking daily weights?: Yes Has weight increased more than 3 lbs. in one day or more than 5 lbs. in a week?: No RPM Scale: Does patient qualify?: Yes We discussed: Sodium restriction, Avoidance of potassium supplements, Daily weight monitoring Assessment:: Controlled Drug: Carvedilol $RemoveBeforeDE'25mg'SpIMEFyaWSnZeih$  QD Assessment: Appropriate, Effective, Safe, Accessible Drug: Losartan $RemoveBefore'50mg'zIfEEpCpiIjIA$  QD Assessment: Appropriate, Effective, Safe, Accessible Drug: Hydralazine $RemoveBeforeDEI'50mg'neppDhhZdrowraAT$  TID Assessment: Appropriate, Effective, Safe, Accessible HC Follow up: N/A Pharmacist Follow up: Pt to continue to follow with cardiologist . Exercise, Diet and Non-Drug Coordination Needs Additional exercise counseling points. We discussed: incorporating flexibility, balance, and strength training exercises, decreasing sedentary behavior Additional diet counseling points. We discussed: key components of a low-carb eating plan, key components of the DASH diet Discussed Non-Drug Care Coordination Needs: Yes Does Patient have Medication financial barriers?: No . Accountable Health Communities Health-Related Social  Needs Screening Tool -  SDOH  ([email or web address]) What is your living situation today? (ref #1): I have a steady place to live Think about  the place you live. Do you have problems with any of the following? (ref #2): None of the above Within the past 12 months, you worried that your food would run out before you got money to buy more (ref #3): Never true Within the past 12 months, the food you bought just didn't last and you didn't have money to get more (ref #4): Never true In the past 12 months, has lack of reliable transportation kept you from medical appointments, meetings, work or from getting things needed for daily living? (ref #5): No In the past 12 months, has the electric, gas, oil, or water company threatened to shut off services in your home? (ref #6): No How often does anyone, including family and friends, physically hurt you? (ref #7): Never (1) How often does anyone, including family and friends, insult or talk down to you? (ref #8): Never (1) How often does anyone, including friends and family, threaten you with harm? (ref #9): Never (1) How often does anyone, including family and friends, scream or curse at you? (ref #10): Never (1) . Jody Nguyen on 11/03/2021 10:01 AM CPP Chart Review: 30 min  CPP Office Visit: 81 min  CPP Office Visit Documentation: 48 min  CPP Coordination of Care: 12 min  CPP Care Plan Completion and Review: 35 min HC Chart Prep: 33mins . Engagement Notes Jody Nguyen on 11/03/2021 12:36 PM HC F/u: N/A&nbsp; CPP F/u:  12/08/21: OV w/ [facility] 01/24/21 @ 10:30am: CCM f/u visit (lipid, Rosuvastatin dose, BP, A1c, sleep improved? CKD)   Care Gaps:  Covid-19 vaccine: Pt will obtain this weekend at [facility] exam: Pt will schedule influenza vaccine: Pt will obtain this weekend at [facility] mammogram: Pt will schedule and hemoglobin A1C Documented (will complete at next visit)   Patient Name:?  Jody Nguyen,Jody Nguyen DOB:??May 31, 1956 ?  Last Care Plan Update:? 11/03/2021 COMPREHENSIVE CARE PLAN AND GOALS?    HYPERTENSION   MOST RECENT BLOOD PRESSURE:      137/71 mmHg on 11/03/2021  MY GOAL BLOOD PRESSURE:   <130/80 mmHg CURRENT MEDICATION AND DOSING:   Hydralazine $RemoveBefo'50mg'dioxlCHtcoe$  TID  Losartan $RemoveB'50mg'IgYQDuAb$  QD  Carvedilol 12.5 BID THE GOALS WE HAVE CHOSEN ARE:     -Maintain an at goal blood pressure   BARRIERS TO ACHIEVING GOALS:   -Controlled  PLAN TO WORK ON THESE GOALS:   -Take medications regularly    -Check BP at home  daily   -Reduce salt intake (< $RemoveB'1500mg'LUkrDaGS$ / day)   -Diet: DASH diet (Choose fruits, vegetables, and low-fat dairy products. Increase whole grains, fish, poultry, nuts. Reduce red meats and sugars)   -Weight: 1 kg = ~1 mmHg reduction   -Exercise  .  CHOLESTEROL   MOST RECENT LABS:  04/20/2021    -TOTAL CHOLESTEROL: 243  -TRIGLYCERIDES: 181  -HDL: 47  -LDL: 162  CURRENT MEDICATION AND DOSING:   Rosuvastatin $RemoveBefor'20mg'TJwlevvIfpZR$  daily THE GOALS WE HAVE CHOSEN ARE:     -Total Cholesterol goal under 200, Triglycerides goal under 150, HDL goal above 40, LDL goal under 100   BARRIERS TO ACHIEVING GOALS:   -Diet PLAN TO WORK ON THESE GOALS:   -Take medication regularly  -Will reach out to the provider about decreasing Rosuvastatin to $RemoveBeforeD'10mg'RQsKGDxlaIoRRR$  daily due to your kidneys.   -Diet: high in plant sterols (fruits/ vegetables/ nuts/ whole grains/ legumes). Increase fiber intake (10-25g/day). Avoid foods high in cholesterol (red meat, egg yolks, dairy, oils/ butter). Choose low-fat options.   -Exercise   -Weight Management  .  DIABETES   MOST RECENT A1C:   5% on 5/25//2022 MY GOAL A1C:   <7% CURRENT MEDICATION AND DOSING:  None THE GOALS WE HAVE CHOSEN ARE:    -Maintain an at goal A1C level   BARRIERS TO ACHIEVING GOALS:   -Controlled PLAN TO WORK ON THESE GOALS:   -Take medications as prescribed   -Check blood glucose daily as needed    -Diet: natural carbs and sugars (vegetables, fruits,  whole grains, legumes, dairy), avoid alcohol, reduce carbohydrate intake and processed sugars. Maintain a low carbohydrate diet, eating 45 grams of carbohydrates per meal (3 servings of carbohydrates per meal), 15 grams of carbohydrates per snack (1 serving of carbohydrate per snack)   -Weight Loss: waist circumference goal < 35 inches for females; < 40 inches for males   -Exercise: 150 minutes of moderate-intensity aerobic activity per week (at least 3 days)     -Foot care: wash, dry examine feet daily. Moisturize the top and bottom (not in between). Trim nails with nail file (no sharp edges). Wear socks and shoes. Elevate feet when sitting. Annual foot exam by podiatrist.     -Recognize sign and symptoms of hypoglycemia and understand treatment as outlined below   -Hypoglycemia sign and symptoms: dizziness, anxiety/ irritability, shakiness, headache, diaphoresis, hunger, confusion, nausea, ataxia, tremors, palpitations/ tachycardia, blurred vision   -Hypoglycemia Treatment: Rule of 15:    (1) 15-20 grams of glucose/ simple carb (4 oz juice, 8oz milk, 4oz regular soda, 1 tablespoon sugar/ honey/ corn syrup, 3-4 glucose tabs/ 1 serving glucose gel)   (2) Recheck BG after 15 mins. If hypoglycemia continues   (3) Repeat steps 1&2, when BG normalizes, eat small meal or snack    ??  HEART FAILURE   TYPE OF HEART FAILURE:  Diastolic LAST EJECTION FRACTION/DATE:   55-60% CURRENTLY STAGED AT:    NYHA Class: I (no activity limitation) ACC/AHA stage: B (Heart disease present - no symptoms present) CURRENT MEDICATION AND DOSING:   Carvedilol $RemoveBef'25mg'vQnqiVdPmx$  daily Losartan $RemoveBefore'50mg'dDdrEEweIQhvP$  daily Hydralazine $RemoveBeforeDEI'50mg'qJbjytFXrLSZNsMn$  3 times a day THE GOALS WE HAVE CHOSEN ARE:    -Reduce shortness of breath and reduce hospitalizations due to heart failure exacerbations   BARRIERS TO ACHIEVING GOALS:   Controlled PLAN TO WORK ON THESE GOALS:   -Take medications regularly   -Check weight daily and log   -Maintain low sodium diet < $Remo'1500mg'TPALT$ /  day   -Activity plan: start slow and stop if feeling chest pain   -Discussed CHF action plan   *GREEN: Take medications as directed, go to your doctor's appointments    *YELLOW: May need diuretic adjustment, call MD or CPP: Weight gain > 3lbs in one day or 3-5 lbs in one week; increased swelling or coughing; increased number of pillows to sleep, shortness of breath with activity   *RED: CALL MD NOW or 911 (if chest pain): weight gain > 5 lbs in 1 week; waking at night SOB; dizzy or falling; SOB at rest, chest tightness or wheezing    ATRIAL FIBRILLATION   PATIENT IS CURRENTLY RHYTHM/RATE CONTROLLED:   Rate controlled CURRENT MEDICATION AND DOSING:   Eliquis $Remove'5mg'mRkYuii$  2 times a day  Carvedilol 25 mg daily THE GOALS WE HAVE CHOSEN ARE:   -Maintain normal heart rhythm -Prevent future stroke and/or heart attack  BARRIERS TO ACHIEVING GOALS:   -Controlled PLAN TO WORK ON THESE GOALS:  -Continue taking medications as prescribed ??  Depression   CURRENT REGIMEN AND DOSING:   Sertraline $RemoveBef'25mg'TCVkaqgboV$  daily THE  GOALS WE HAVE CHOSEN ARE:   -Lower and manage symptoms of depression   PLAN TO WORK ON THESE GOALS:     -Continue medication as prescribed   -Inform practitioner of any signs and symptoms of worsening depression   -Symptoms: persistent feeling of hopelessness, dejection, constant worry, poor concentration, lack of energy, inability to sleep, sometimes suicidal tendencies, agitation, decreased concentration, sleep changes, diminished interest/ pleasure, changes in appetite    Chronic Kidney Disease   Labs:   Previous GFR: 6 taken on: 09/05/2021 The current microalbumin ratio is: 754 tested on: 04/15/2015 CKD Stage: Stage 5 (GFR <15 mL/min) Albuminuria Stage: A3 (>300)  PLAN TO WORK ON THESE GOALS:     -Maintaining normal BP and BG   -Diet: Decrease salt and fatty foods. Increase fruit and vegetables     ?  ACTIVE MEDICATION LIST   Your current medication list has been updated. To view,  log in to your patient portal.   Call if any changes need to be made.      MEDICATION REVIEW   MEDICATION REVIEW CONDUCTED:   Yes    DATE:    11/03/2021  BEST POSSIBLE MEDICATION HISTORY  SOURCE:   Medical Records  ??    HOW DO I? - WHEN DO I?     GET AHOLD OF MY DOCTOR?    DURING BUSINESS HOURS WHEN THE OFFICE IS OPEN    PHONE: [phone]  AFTER HOURS [facility] NURSE WHEN THE OFFICE IS CLOSED   PHONE: [phone]   TALK TO [place] CARE COORDINATOR NAME: Jody Nguyen   PHONE: [phone]  EMAIL:  [email or web address]  CARE COORDINATOR STAFF   NAME: Rosary Lively, Marlene Bast, Sandria Bales   PHONE: [phone]    NOTE SECTION   Thank you for participating in the Chronic Care Management (CCM) program with Dr. Newton Nguyen     This program takes a proactive approach to your health and my team will serve as a resource for you throughout the year. Please follow up at [phone] if you have any questions or experience changes to your overall health. Your next CCM appointment will be conducted with Jody Nguyen, PharmD as follows:      Date:  01/24/2021  Time:   10:30am Over the Phone   ?  Rachelle Hora Jeannett Senior, PharmD  Clinical Pharmacist  [email or web address]  [phone]

## 2021-11-14 DIAGNOSIS — D631 Anemia in chronic kidney disease: Secondary | ICD-10-CM | POA: Diagnosis not present

## 2021-11-14 DIAGNOSIS — N186 End stage renal disease: Secondary | ICD-10-CM | POA: Diagnosis not present

## 2021-11-14 DIAGNOSIS — Z992 Dependence on renal dialysis: Secondary | ICD-10-CM | POA: Diagnosis not present

## 2021-11-14 DIAGNOSIS — D689 Coagulation defect, unspecified: Secondary | ICD-10-CM | POA: Diagnosis not present

## 2021-11-14 DIAGNOSIS — N2581 Secondary hyperparathyroidism of renal origin: Secondary | ICD-10-CM | POA: Diagnosis not present

## 2021-11-14 DIAGNOSIS — E118 Type 2 diabetes mellitus with unspecified complications: Secondary | ICD-10-CM | POA: Diagnosis not present

## 2021-11-16 DIAGNOSIS — Z992 Dependence on renal dialysis: Secondary | ICD-10-CM | POA: Diagnosis not present

## 2021-11-16 DIAGNOSIS — N2581 Secondary hyperparathyroidism of renal origin: Secondary | ICD-10-CM | POA: Diagnosis not present

## 2021-11-16 DIAGNOSIS — N186 End stage renal disease: Secondary | ICD-10-CM | POA: Diagnosis not present

## 2021-11-16 DIAGNOSIS — D689 Coagulation defect, unspecified: Secondary | ICD-10-CM | POA: Diagnosis not present

## 2021-11-16 DIAGNOSIS — E118 Type 2 diabetes mellitus with unspecified complications: Secondary | ICD-10-CM | POA: Diagnosis not present

## 2021-11-16 DIAGNOSIS — D631 Anemia in chronic kidney disease: Secondary | ICD-10-CM | POA: Diagnosis not present

## 2021-11-18 DIAGNOSIS — N2581 Secondary hyperparathyroidism of renal origin: Secondary | ICD-10-CM | POA: Diagnosis not present

## 2021-11-18 DIAGNOSIS — E118 Type 2 diabetes mellitus with unspecified complications: Secondary | ICD-10-CM | POA: Diagnosis not present

## 2021-11-18 DIAGNOSIS — D689 Coagulation defect, unspecified: Secondary | ICD-10-CM | POA: Diagnosis not present

## 2021-11-18 DIAGNOSIS — N186 End stage renal disease: Secondary | ICD-10-CM | POA: Diagnosis not present

## 2021-11-18 DIAGNOSIS — Z992 Dependence on renal dialysis: Secondary | ICD-10-CM | POA: Diagnosis not present

## 2021-11-18 DIAGNOSIS — D631 Anemia in chronic kidney disease: Secondary | ICD-10-CM | POA: Diagnosis not present

## 2021-11-21 DIAGNOSIS — Z992 Dependence on renal dialysis: Secondary | ICD-10-CM | POA: Diagnosis not present

## 2021-11-21 DIAGNOSIS — N2581 Secondary hyperparathyroidism of renal origin: Secondary | ICD-10-CM | POA: Diagnosis not present

## 2021-11-21 DIAGNOSIS — N186 End stage renal disease: Secondary | ICD-10-CM | POA: Diagnosis not present

## 2021-11-21 DIAGNOSIS — E118 Type 2 diabetes mellitus with unspecified complications: Secondary | ICD-10-CM | POA: Diagnosis not present

## 2021-11-21 DIAGNOSIS — D631 Anemia in chronic kidney disease: Secondary | ICD-10-CM | POA: Diagnosis not present

## 2021-11-21 DIAGNOSIS — D689 Coagulation defect, unspecified: Secondary | ICD-10-CM | POA: Diagnosis not present

## 2021-11-23 DIAGNOSIS — N186 End stage renal disease: Secondary | ICD-10-CM | POA: Diagnosis not present

## 2021-11-23 DIAGNOSIS — D631 Anemia in chronic kidney disease: Secondary | ICD-10-CM | POA: Diagnosis not present

## 2021-11-23 DIAGNOSIS — D689 Coagulation defect, unspecified: Secondary | ICD-10-CM | POA: Diagnosis not present

## 2021-11-23 DIAGNOSIS — N2581 Secondary hyperparathyroidism of renal origin: Secondary | ICD-10-CM | POA: Diagnosis not present

## 2021-11-23 DIAGNOSIS — E118 Type 2 diabetes mellitus with unspecified complications: Secondary | ICD-10-CM | POA: Diagnosis not present

## 2021-11-23 DIAGNOSIS — Z992 Dependence on renal dialysis: Secondary | ICD-10-CM | POA: Diagnosis not present

## 2021-11-24 DIAGNOSIS — E785 Hyperlipidemia, unspecified: Secondary | ICD-10-CM | POA: Diagnosis not present

## 2021-11-24 DIAGNOSIS — I10 Essential (primary) hypertension: Secondary | ICD-10-CM | POA: Diagnosis not present

## 2021-11-24 DIAGNOSIS — E1169 Type 2 diabetes mellitus with other specified complication: Secondary | ICD-10-CM | POA: Diagnosis not present

## 2021-11-24 DIAGNOSIS — E1122 Type 2 diabetes mellitus with diabetic chronic kidney disease: Secondary | ICD-10-CM | POA: Diagnosis not present

## 2021-11-25 DIAGNOSIS — N186 End stage renal disease: Secondary | ICD-10-CM | POA: Diagnosis not present

## 2021-11-25 DIAGNOSIS — Z992 Dependence on renal dialysis: Secondary | ICD-10-CM | POA: Diagnosis not present

## 2021-11-25 DIAGNOSIS — D631 Anemia in chronic kidney disease: Secondary | ICD-10-CM | POA: Diagnosis not present

## 2021-11-25 DIAGNOSIS — N2581 Secondary hyperparathyroidism of renal origin: Secondary | ICD-10-CM | POA: Diagnosis not present

## 2021-11-25 DIAGNOSIS — D689 Coagulation defect, unspecified: Secondary | ICD-10-CM | POA: Diagnosis not present

## 2021-11-25 DIAGNOSIS — E118 Type 2 diabetes mellitus with unspecified complications: Secondary | ICD-10-CM | POA: Diagnosis not present

## 2021-11-26 DIAGNOSIS — N186 End stage renal disease: Secondary | ICD-10-CM | POA: Diagnosis not present

## 2021-11-26 DIAGNOSIS — E1129 Type 2 diabetes mellitus with other diabetic kidney complication: Secondary | ICD-10-CM | POA: Diagnosis not present

## 2021-11-26 DIAGNOSIS — Z992 Dependence on renal dialysis: Secondary | ICD-10-CM | POA: Diagnosis not present

## 2021-11-28 ENCOUNTER — Emergency Department (HOSPITAL_COMMUNITY): Payer: Medicare Other

## 2021-11-28 ENCOUNTER — Other Ambulatory Visit: Payer: Self-pay

## 2021-11-28 ENCOUNTER — Emergency Department (HOSPITAL_COMMUNITY)
Admission: EM | Admit: 2021-11-28 | Discharge: 2021-11-28 | Disposition: A | Discharge status: Home / Self Care | Payer: Medicare Other | Attending: Emergency Medicine | Admitting: Emergency Medicine

## 2021-11-28 ENCOUNTER — Encounter (HOSPITAL_COMMUNITY): Payer: Self-pay | Admitting: Emergency Medicine

## 2021-11-28 DIAGNOSIS — R531 Weakness: Secondary | ICD-10-CM | POA: Insufficient documentation

## 2021-11-28 DIAGNOSIS — Z7901 Long term (current) use of anticoagulants: Secondary | ICD-10-CM | POA: Diagnosis not present

## 2021-11-28 DIAGNOSIS — R55 Syncope and collapse: Secondary | ICD-10-CM

## 2021-11-28 DIAGNOSIS — D631 Anemia in chronic kidney disease: Secondary | ICD-10-CM | POA: Diagnosis not present

## 2021-11-28 DIAGNOSIS — D509 Iron deficiency anemia, unspecified: Secondary | ICD-10-CM | POA: Diagnosis not present

## 2021-11-28 DIAGNOSIS — Z992 Dependence on renal dialysis: Secondary | ICD-10-CM | POA: Diagnosis not present

## 2021-11-28 DIAGNOSIS — J9811 Atelectasis: Secondary | ICD-10-CM | POA: Diagnosis not present

## 2021-11-28 DIAGNOSIS — D689 Coagulation defect, unspecified: Secondary | ICD-10-CM | POA: Diagnosis not present

## 2021-11-28 DIAGNOSIS — N2581 Secondary hyperparathyroidism of renal origin: Secondary | ICD-10-CM | POA: Diagnosis not present

## 2021-11-28 DIAGNOSIS — I517 Cardiomegaly: Secondary | ICD-10-CM | POA: Diagnosis not present

## 2021-11-28 DIAGNOSIS — Z7982 Long term (current) use of aspirin: Secondary | ICD-10-CM | POA: Insufficient documentation

## 2021-11-28 DIAGNOSIS — E118 Type 2 diabetes mellitus with unspecified complications: Secondary | ICD-10-CM | POA: Diagnosis not present

## 2021-11-28 DIAGNOSIS — N186 End stage renal disease: Secondary | ICD-10-CM | POA: Diagnosis not present

## 2021-11-28 LAB — CBC
HCT: 30.4 % — ABNORMAL LOW (ref 36.0–46.0)
Hemoglobin: 9.5 g/dL — ABNORMAL LOW (ref 12.0–15.0)
MCH: 26.3 pg (ref 26.0–34.0)
MCHC: 31.3 g/dL (ref 30.0–36.0)
MCV: 84.2 fL (ref 80.0–100.0)
Platelets: 180 10*3/uL (ref 150–400)
RBC: 3.61 MIL/uL — ABNORMAL LOW (ref 3.87–5.11)
RDW: 18.9 % — ABNORMAL HIGH (ref 11.5–15.5)
WBC: 4.8 10*3/uL (ref 4.0–10.5)
nRBC: 2.3 % — ABNORMAL HIGH (ref 0.0–0.2)

## 2021-11-28 LAB — COMPREHENSIVE METABOLIC PANEL
ALT: 21 U/L (ref 0–44)
AST: 19 U/L (ref 15–41)
Albumin: 3.6 g/dL (ref 3.5–5.0)
Alkaline Phosphatase: 88 U/L (ref 38–126)
Anion gap: 14 (ref 5–15)
BUN: 20 mg/dL (ref 8–23)
CO2: 31 mmol/L (ref 22–32)
Calcium: 9.2 mg/dL (ref 8.9–10.3)
Chloride: 92 mmol/L — ABNORMAL LOW (ref 98–111)
Creatinine, Ser: 5.57 mg/dL — ABNORMAL HIGH (ref 0.44–1.00)
GFR, Estimated: 8 mL/min — ABNORMAL LOW (ref >60)
Glucose, Bld: 146 mg/dL — ABNORMAL HIGH (ref 70–99)
Potassium: 3.2 mmol/L — ABNORMAL LOW (ref 3.5–5.1)
Sodium: 137 mmol/L (ref 135–145)
Total Bilirubin: 0.9 mg/dL (ref 0.3–1.2)
Total Protein: 6.9 g/dL (ref 6.5–8.1)

## 2021-11-28 LAB — TROPONIN I (HIGH SENSITIVITY)
Troponin I (High Sensitivity): 308 ng/L (ref <18)
Troponin I (High Sensitivity): 276 ng/L (ref <18)

## 2021-11-28 NOTE — ED Notes (Signed)
Pt verbalizes understanding of discharge instructions. Opportunity for questions and answers were provided. Pt discharged from the ED.   ?

## 2021-11-28 NOTE — ED Provider Notes (Addendum)
Emergency Medicine Provider Triage Evaluation Note  Jody Nguyen , a 66 y.o. female  was evaluated in triage.  Pt complains of a syncopal episode after dialysis.  She had normal run earlier this morning.  She then became generally weak and lost consciousness.  No prodromal symptoms.  Otherwise healthy without any fever, chills, cough, congestion, nausea, vomiting, diarrhea, abdominal pain.  Review of Systems  Positive:  Negative: See above   Physical Exam  BP (!) 87/51 (BP Location: Left Arm)    Pulse 99    Temp (!) 97.5 F (36.4 C) (Oral)    Resp 17    SpO2 91%  Gen:   Awake, no distress   Resp:  Normal effort  MSK:   Moves extremities without difficulty  Other:    Medical Decision Making  Medically screening exam initiated at 1:35 PM.  Appropriate orders placed.  Jody Nguyen was informed that the remainder of the evaluation will be completed by another provider, this initial triage assessment does not replace that evaluation, and the importance of remaining in the ED until their evaluation is complete.  Patient is hypotensive and in need of emergent medical care.    Hendricks Limes, PA-C 11/28/21 1336    Myna Bright Jersey, PA-C 11/28/21 1343    Godfrey Pick, MD 11/30/21 [phone]

## 2021-11-28 NOTE — ED Provider Notes (Signed)
[facility] EMERGENCY DEPARTMENT Provider Note   CSN: [id number] Arrival date & time: 11/28/21  1251     History  Chief Complaint  Patient presents with   Weakness    Jody Nguyen is a 66 y.o. female.  66 yo F with a chief complaints of a near syncopal event.  The patient states that she had dialysis this morning.  She went home and felt fine and then when she was walking down her hallway in her house she felt very weak and felt like her legs could not carry her and she had a controlled collapsed to the ground.  She now feels much better.  Denies recent illness denies cough congestion or fever denies decreased oral intake denies nausea vomiting or diarrhea.  She denies headache neck pain chest pain or shortness of breath.  She denies any increased fluid taken off of her today at dialysis.  The history is provided by the patient.  Weakness Associated symptoms: no arthralgias, no chest pain, no dizziness, no dysuria, no fever, no headaches, no myalgias, no nausea, no shortness of breath, no urgency and no vomiting   Illness Severity:  Moderate Onset quality:  Sudden Duration:  2 minutes Timing:  Rare Progression:  Resolved Chronicity:  New Associated symptoms: no chest pain, no congestion, no fever, no headaches, no myalgias, no nausea, no rhinorrhea, no shortness of breath, no vomiting and no wheezing       Home Medications Prior to Admission medications   Medication Sig Start Date End Date Taking? Authorizing Provider  acetaminophen (TYLENOL) 500 MG tablet Take 500 mg by mouth as needed (for pain.).   Yes [provider]  apixaban (ELIQUIS) 5 MG TABS tablet Take 5 mg by mouth 2 (two) times daily.   Yes [provider]  aspirin EC 81 MG tablet Take 81 mg by mouth daily.   Yes [provider]  carvedilol (COREG) 25 MG tablet Take 25 mg by mouth daily.   Yes [provider]  cinacalcet (SENSIPAR) 60 MG tablet Take 1 tablet  (60 mg total) by mouth daily. Patient taking differently: Take 60 mg by mouth See admin instructions. Takes at dialysis M/W/F 09/11/17  Yes Unk Pinto, MD  hydrALAZINE (APRESOLINE) 50 MG tablet Take 1 tablet (50 mg total) by mouth 3 (three) times daily. FOR BLOOD  PRESSURE AND HEART 11/09/20  Yes Lelon Perla, MD  latanoprost (XALATAN) 0.005 % ophthalmic solution Place 1 drop into both eyes at bedtime. 09/16/18  Yes [provider]  losartan (COZAAR) 50 MG tablet Take 1 tablet Daily for BP Patient taking differently: Take 50 mg by mouth daily. 03/19/20  Yes Unk Pinto, MD  macitentan (OPSUMIT) 10 MG tablet Take 10 mg by mouth daily.   Yes [provider]  Multiple Vitamins-Minerals (ZINC PO) Take 1 tablet by mouth daily.   Yes [provider]  RENVELA 800 MG tablet Take 1,600 mg by mouth See admin instructions. 1600 mg (2 caps) with every meal 12/21/15  Yes [provider]  rosuvastatin (CRESTOR) 20 MG tablet Take 1 tablet Daily for Cholesterol Patient taking differently: Take 20 mg by mouth at bedtime. 03/19/20  Yes Unk Pinto, MD  sertraline (ZOLOFT) 25 MG tablet Take 1 tablet Daily for Mood Patient taking differently: Take 25 mg by mouth daily. 03/19/20  Yes Unk Pinto, MD  sildenafil (REVATIO) 20 MG tablet Take 2 tablets (40 mg) 3 x  /day fr Pulmonary Hypertension Patient taking differently:  Take 40 mg by mouth 3 (three) times daily. 03/26/20  Yes Unk Pinto, MD  azelastine (ASTELIN) 0.1 % nasal spray Use 1 to 2 sprays each nostril 2 to 3 x / day Patient not taking: Reported on 11/03/2021 03/26/18   Unk Pinto, MD  ondansetron (ZOFRAN ODT) 4 MG disintegrating tablet Take 1 tablet (4 mg total) by mouth every 8 (eight) hours as needed for nausea or vomiting. Patient not taking: Reported on 11/03/2021 02/23/19   Rodell Perna A, PA-C  pantoprazole (PROTONIX) 40 MG tablet Take 1 tablet (40 mg total) by mouth daily. 08/29/21 11/24/21   Barb Merino, MD      Allergies    Minoxidil, Lipitor [atorvastatin], Morphine and related, and Ace inhibitors    Review of Systems   Review of Systems  Constitutional:  Negative for chills and fever.  HENT:  Negative for congestion and rhinorrhea.   Eyes:  Negative for redness and visual disturbance.  Respiratory:  Negative for shortness of breath and wheezing.   Cardiovascular:  Negative for chest pain and palpitations.  Gastrointestinal:  Negative for nausea and vomiting.  Genitourinary:  Negative for dysuria and urgency.  Musculoskeletal:  Negative for arthralgias and myalgias.  Skin:  Negative for pallor and wound.  Neurological:  Positive for syncope (near) and weakness. Negative for dizziness and headaches.   Physical Exam Updated Vital Signs BP 116/64 (BP Location: Left Arm)    Pulse 94    Temp (!) 97.5 F (36.4 C) (Oral)    Resp 16    SpO2 92%  Physical Exam Vitals and nursing note reviewed.  Constitutional:      General: She is not in acute distress.    Appearance: She is well-developed. She is not diaphoretic.  HENT:     Head: Normocephalic and atraumatic.  Eyes:     Pupils: Pupils are equal, round, and reactive to light.  Cardiovascular:     Rate and Rhythm: Normal rate and regular rhythm.     Heart sounds: No murmur heard.   No friction rub. No gallop.  Pulmonary:     Effort: Pulmonary effort is normal.     Breath sounds: No wheezing or rales.  Abdominal:     General: There is no distension.     Palpations: Abdomen is soft.     Tenderness: There is no abdominal tenderness.  Musculoskeletal:        General: No tenderness.     Cervical back: Normal range of motion and neck supple.     Comments: Right AV fistula with palpable thrill  Skin:    General: Skin is warm and dry.  Neurological:     Mental Status: She is alert and oriented to person, place, and time.  Psychiatric:        Behavior: Behavior normal.    ED Results / Procedures / Treatments    Labs (all labs ordered are listed, but only abnormal results are displayed) Labs Reviewed  CBC - Abnormal; Notable for the following components:      Result Value   RBC 3.61 (*)    Hemoglobin 9.5 (*)    HCT 30.4 (*)    RDW 18.9 (*)    nRBC 2.3 (*)    All other components within normal limits  COMPREHENSIVE METABOLIC PANEL - Abnormal; Notable for the following components:   Potassium 3.2 (*)    Chloride 92 (*)    Glucose, Bld 146 (*)    Creatinine, Ser 5.57 (*)  GFR, Estimated 8 (*)    All other components within normal limits  TROPONIN I (HIGH SENSITIVITY) - Abnormal; Notable for the following components:   Troponin I (High Sensitivity) 308 (*)    All other components within normal limits  TROPONIN I (HIGH SENSITIVITY) - Abnormal; Notable for the following components:   Troponin I (High Sensitivity) 276 (*)    All other components within normal limits  CBG MONITORING, ED    EKG EKG Interpretation  Date/Time:  Monday November 28 2021 15:31:12 EST Ventricular Rate:  92 PR Interval:  228 QRS Duration: 108 QT Interval:  404 QTC Calculation: 499 R Axis:   111 Text Interpretation: Sinus rhythm with 1st degree A-V block Right axis deviation Anterior infarct , age undetermined Abnormal ECG When compared with ECG of 28-Aug-2021 01:14, PREVIOUS ECG IS PRESENT downsloping st segments in lateral leads seen on prior no wpw, prolonged qt or brugada Otherwise no significant change Confirmed by [name] [phone]) on 11/28/2021 3:35:40 PM  Radiology DG Chest 2 View  Result Date: 11/28/2021 CLINICAL DATA:  Syncope EXAM: CHEST - 2 VIEW COMPARISON:  Chest radiograph dated August 28, 2021 FINDINGS: The heart is enlarged. Prosthetic mitral valve. Atherosclerotic calcification of aorta which is tortuous. Lungs are clear without evidence of focal consolidation. Elevation of the right hemidiaphragm with minimal atelectasis. IMPRESSION: 1. Stable cardiomegaly. 2. No focal consolidation or pleural  effusion. Electronically Signed   By: Keane Police D.O.   On: 11/28/2021 14:26    Procedures Procedures    Medications Ordered in ED Medications - No data to display  ED Course/ Medical Decision Making/ A&P                           Medical Decision Making Amount and/or Complexity of Data Reviewed External Data Reviewed: radiology and notes.    Details: I reviewed the patients medical record.  [facility] for R heart failure, noted to have low blood pressures after dialysis.  R heart cath this year with increased pulmonary pressures.   66 yo F with a chief complaints of a near syncopal event.  This was very soon after dialysis but not immediately post.  Could be fluid shifts from fluid taken off during dialysis or vasovagal.  Initial blood pressure checked in the ED was 87/51.  Improved without any intervention into the 90s.  She denies any symptoms.  She initially was also borderline hypoxic.  Her troponin is elevated, looks like it is chronically elevated but a bit higher than it is typically.  EKG without any concerning ischemic changes.  No signs of Wolff-Parkinson-White Brugada or prolonged QT.  Her CBC without anemia.  We will obtain a second troponin.  Second troponin less than the first.  Without any chest pain or difficulty breathing feels she likely can be discharged safely.  Patient is anxious to get home.  Have her follow-up with her family doctor.  7:44 PM:  I have discussed the diagnosis/risks/treatment options with the patient and believe the pt to be eligible for discharge home to follow-up with PCP. We also discussed returning to the ED immediately if new or worsening sx occur. We discussed the sx which are most concerning (e.g., sudden worsening pain, fever, inability to tolerate by mouth) that necessitate immediate return. Medications administered to the patient during their visit and any new prescriptions provided to the patient are listed below.  Medications  given during this visit  Medications - No data to display   The patient appears reasonably screen and/or stabilized for discharge and I doubt any other medical condition or other [facility] requiring further screening, evaluation, or treatment in the ED at this time prior to discharge.          Final Clinical Impression(s) / ED Diagnoses Final diagnoses:  Near syncope    Rx / DC Orders ED Discharge Orders     None         [name], DO 11/28/21 1944

## 2021-11-28 NOTE — Discharge Instructions (Signed)
Follow up with your family doctor in the office.  Return for chest pain, shortness of breath, headache or neck pain

## 2021-11-28 NOTE — ED Triage Notes (Signed)
Patient coming from home, complaint of weakness following dialysis. Pt state she got home from dialysis and feeling weak. Hypotensive @ triage.

## 2021-11-30 DIAGNOSIS — E118 Type 2 diabetes mellitus with unspecified complications: Secondary | ICD-10-CM | POA: Diagnosis not present

## 2021-11-30 DIAGNOSIS — N186 End stage renal disease: Secondary | ICD-10-CM | POA: Diagnosis not present

## 2021-11-30 DIAGNOSIS — Z992 Dependence on renal dialysis: Secondary | ICD-10-CM | POA: Diagnosis not present

## 2021-11-30 DIAGNOSIS — D689 Coagulation defect, unspecified: Secondary | ICD-10-CM | POA: Diagnosis not present

## 2021-11-30 DIAGNOSIS — D631 Anemia in chronic kidney disease: Secondary | ICD-10-CM | POA: Diagnosis not present

## 2021-11-30 DIAGNOSIS — N2581 Secondary hyperparathyroidism of renal origin: Secondary | ICD-10-CM | POA: Diagnosis not present

## 2021-11-30 DIAGNOSIS — D509 Iron deficiency anemia, unspecified: Secondary | ICD-10-CM | POA: Diagnosis not present

## 2021-11-30 NOTE — Progress Notes (Deleted)
HPI: Follow-up pulmonary hypertension, diastolic heart failure and previous mitral valve repair. Renal dopplers Nov 2013 showed no RAS. Note previous pericardial effusion with minoxidil. Transesophageal echocardiogram July 2016 showed normal LV function, moderate to severe left atrial enlargement, small pericardial effusion, flail segment of anterior mitral valve leaflet with severe mitral regurgitation and mild tricuspid regurgitation. Cardiac catheterization September 2016 showed nonobstructive coronary disease and severely elevated pulmonary pressures. Carotid Dopplers August 2016 showed 1-39% bilateral stenosis. Patient had mitral valve repair in August 2016. Patient's course was complicated by acute hypoxic respiratory failure refractory to conventional ventilatory support. She was transferred to [facility] for ECMO. Course was complicated by need for tracheostomy. She also developed acute on chronic renal failure and now requires dialysis. Nuclear study January 2020 showed ejection fraction 57% and no scar or ischemia. FEV1 September 2021 1.21.  VQ scan September 2021 concerning for pulmonary embolus and left lower lobe apical segment. Followed at [facility] for pulmonary hypertension as she was being considered for renal transplant.  Recently admitted with chest pain.  This was felt to be musculoskeletal in etiology.  Troponins were unremarkable.  Echocardiogram October 2022 showed normal LV function, moderate left ventricular hypertrophy, mild right ventricular enlargement, moderate pulmonary hypertension, mild to moderate left atrial enlargement, mild right atrial enlargement, status post mitral valve repair with mild mitral regurgitation and mean gradient 9 mmHg suggestive of moderate mitral stenosis, moderate tricuspid regurgitation.  Right heart catheterization October 2022 showed cardiac output 5.8, cardiac index 3.2, RA pressure 6 mmHg, RV pressure 52/3, PA 50/23 and pulmonary capillary wedge  pressure 25. Nuclear study 10/22 showed ejection fraction 42%, inferior infarct but no ischemia.  Since last seen,   Current Outpatient Medications  Medication Sig Dispense Refill   acetaminophen (TYLENOL) 500 MG tablet Take 500 mg by mouth as needed (for pain.).     apixaban (ELIQUIS) 5 MG TABS tablet Take 5 mg by mouth 2 (two) times daily.     aspirin EC 81 MG tablet Take 81 mg by mouth daily.     azelastine (ASTELIN) 0.1 % nasal spray Use 1 to 2 sprays each nostril 2 to 3 x / day (Patient not taking: Reported on 11/03/2021) 90 mL 3   carvedilol (COREG) 25 MG tablet Take 25 mg by mouth daily.     cinacalcet (SENSIPAR) 60 MG tablet Take 1 tablet (60 mg total) by mouth daily. (Patient taking differently: Take 60 mg by mouth See admin instructions. Takes at dialysis M/W/F) 90 tablet 1   hydrALAZINE (APRESOLINE) 50 MG tablet Take 1 tablet (50 mg total) by mouth 3 (three) times daily. FOR BLOOD  PRESSURE AND HEART 270 tablet 3   latanoprost (XALATAN) 0.005 % ophthalmic solution Place 1 drop into both eyes at bedtime.     losartan (COZAAR) 50 MG tablet Take 1 tablet Daily for BP (Patient taking differently: Take 50 mg by mouth daily.) 90 tablet 1   macitentan (OPSUMIT) 10 MG tablet Take 10 mg by mouth daily.     Multiple Vitamins-Minerals (ZINC PO) Take 1 tablet by mouth daily.     ondansetron (ZOFRAN ODT) 4 MG disintegrating tablet Take 1 tablet (4 mg total) by mouth every 8 (eight) hours as needed for nausea or vomiting. (Patient not taking: Reported on 11/03/2021) 10 tablet 0   pantoprazole (PROTONIX) 40 MG tablet Take 1 tablet (40 mg total) by mouth daily. 30 tablet 1   RENVELA 800 MG tablet Take 1,600 mg by mouth See  admin instructions. 1600 mg (2 caps) with every meal  5   rosuvastatin (CRESTOR) 20 MG tablet Take 1 tablet Daily for Cholesterol (Patient taking differently: Take 20 mg by mouth at bedtime.) 90 tablet 3   sertraline (ZOLOFT) 25 MG tablet Take 1 tablet Daily for Mood (Patient taking  differently: Take 25 mg by mouth daily.) 90 tablet 3   sildenafil (REVATIO) 20 MG tablet Take 2 tablets (40 mg) 3 x  /day fr Pulmonary Hypertension (Patient taking differently: Take 40 mg by mouth 3 (three) times daily.) 540 tablet 3   No current facility-administered medications for this visit.     Past Medical History:  Diagnosis Date   Anemia    Chronic diastolic CHF (congestive heart failure) (HCC)    Chronic diastolic heart failure (HCC)    CKD (chronic kidney disease) stage 4, GFR 15-29 ml/min (HCC)    dialysis M/W/F   Constipation    CVA (cerebral infarction) 1997   no residual deficit   Diverticulitis    DM (diabetes mellitus) (HCC)    type 2   Heart murmur    History of cardiovascular stress test    a. Myoview Oct 2012 showed EF 49%, no ischemia, LVE   HLD (hyperlipidemia)    takes Crestor daily   Hypertension    Pericardial effusion    chronic; felt to be poss related to minoxidil >> DC'd   Pulmonary hypertension ([place]) 06/18/2015   Respiratory failure, acute hypoxic, post-operative 07/08/2015   Requiring ECMO support   S/P cardiac catheterization    a. R/L [facility] 06/18/15:  mLAD 30%; severe pulmo HTN with PA sat 43%, CI 1.86, prominent V waves indicative of MR; resting hypoxemia O2 sat 86% on RA   S/P minimally invasive mitral valve repair 07/08/2015   Complex valvuloplasty including artificial Gore-tex neochord placement x10 and 26 mm Sorin Memo 3D Rechord ring annuloplasty via right mini thoracotomy approach   Severe mitral regurgitation    Shortness of breath dyspnea    Stroke ([place]) 1997   no residual effect   Vitamin D deficiency    Wears glasses     Past Surgical History:  Procedure Laterality Date   A/V FISTULAGRAM Right 08/16/2017   Procedure: A/V Fistulagram;  Surgeon: Conrad , MD;  Location: [place] [facility];  Service: Cardiovascular;  Laterality: Right;   AV FISTULA PLACEMENT Left 10/22/2015   Procedure: RADIOCEPHALIC ARTERIOVENOUS (AV) FISTULA  CREATION;  Surgeon: Serafina Mitchell, MD;  Location: [place] [facility];  Service: Vascular;  Laterality: Left;   AV FISTULA PLACEMENT Right 05/22/2017   Procedure: ARTERIOVENOUS (AV) FISTULA CREATION-RIGHT ARM;  Surgeon: Waynetta Sandy, MD;  Location: [place];  Service: Vascular;  Laterality: Right;   CANNULATION FOR CARDIOPULMONARY BYPASS N/A 07/08/2015   Procedure: CANNULATION FOR ECMO;  Surgeon: Rexene Alberts, MD;  Location: [place];  Service: Open Heart Surgery;  Laterality: N/A;   CARDIAC CATHETERIZATION     CARDIAC CATHETERIZATION N/A 06/18/2015   Procedure: Right/Left Heart Cath and Coronary Angiography;  Surgeon: Jettie Booze, MD;  Location: [place] [facility];  Service: Cardiovascular;  Laterality: N/A;   CESAREAN SECTION     x 2   COLONOSCOPY     EXCHANGE OF A DIALYSIS CATHETER N/A 06/28/2017   Procedure: EXCHANGE OF A DIALYSIS CATHETER;  Surgeon: Waynetta Sandy, MD;  Location: [place];  Service: Vascular;  Laterality: N/A;   FISTULA SUPERFICIALIZATION Left 12/28/2015   Procedure: SUPERFICIALIZATION LEFT RADIOCEPHALIC FISTULA;  Surgeon:  Serafina Mitchell, MD;  Location: [place];  Service: Vascular;  Laterality: Left;   FISTULA SUPERFICIALIZATION Right 08/21/2017   Procedure: FISTULA SUPERFICIALIZATION RIGHT ARM;  Surgeon: Waynetta Sandy, MD;  Location: [place];  Service: Vascular;  Laterality: Right;   INSERTION OF DIALYSIS CATHETER Right 04/28/2017   Procedure: INSERTION OF DIALYSIS CATHETER-RIGHT INTERNAL JUGULAR PLACEMENT;  Surgeon: Elam Dutch, MD;  Location: [place];  Service: Vascular;  Laterality: Right;   LIGATION OF ARTERIOVENOUS  FISTULA Left 04/28/2017   Procedure: LIGATION OF ARTERIOVENOUS  FISTULA;  Surgeon: Elam Dutch, MD;  Location: [place];  Service: Vascular;  Laterality: Left;   MITRAL VALVE REPAIR Right 07/08/2015   Procedure: MINIMALLY INVASIVE MITRAL VALVE REPAIR with a 26 Sorin Memo 3D Rechord;  Surgeon: Rexene Alberts, MD;  Location: [place];   Service: Open Heart Surgery;  Laterality: Right;   MULTIPLE EXTRACTIONS WITH ALVEOLOPLASTY N/A 06/30/2015   Procedure: MULTIPLE EXTRACTIONS OF TOOTH #'S 4  AND 30 WITH ALVEOLOPLASTY AND GROSS DEBRIDEMENT  OF REMAINING TEETH;  Surgeon: Lenn Cal, DDS;  Location: [place];  Service: Oral Surgery;  Laterality: N/A;   PERIPHERAL VASCULAR CATHETERIZATION Left 03/28/2016   Procedure: A/V Fistulagram;  Surgeon: Serafina Mitchell, MD;  Location: [place] [facility];  Service: Cardiovascular;  Laterality: Left;  lower arm   TEE WITHOUT CARDIOVERSION N/A 06/08/2015   Procedure: TRANSESOPHAGEAL ECHOCARDIOGRAM (TEE);  Surgeon: Lelon Perla, MD;  Location: [facility] END[facility];  Service: Cardiovascular;  Laterality: N/A;   TEE WITHOUT CARDIOVERSION N/A 07/08/2015   Procedure: TRANSESOPHAGEAL ECHOCARDIOGRAM (TEE);  Surgeon: Rexene Alberts, MD;  Location: [place];  Service: Open Heart Surgery;  Laterality: N/A;    Social History   Socioeconomic History   Marital status: Married    Spouse name: Not on file   Number of children: 3   Years of education: Not on file   Highest education level: Not on file  Occupational History    Comment: Unemployed; used to work as a Radio broadcast assistant  Tobacco Use   Smoking status: Never   Smokeless tobacco: Never  Vaping Use   Vaping Use: Never used  Substance and Sexual Activity   Alcohol use: No    Alcohol/week: 0.0 standard drinks   Drug use: No   Sexual activity: Not on file  Other Topics Concern   Not on file  Social History Narrative   Not on file   Social Determinants of Health   Financial Resource Strain: Not on file  Food Insecurity: No Food Insecurity   Worried About Charity fundraiser in the Last Year: Never true   [place] in the Last Year: Never true  Transportation Needs: No Transportation Needs   Lack of Transportation (Medical): No   Lack of Transportation (Non-Medical): No  Physical Activity: Not on file  Stress: Not on file   Social Connections: Not on file  Intimate Partner Violence: Not on file    Family History  Problem Relation Age of Onset   Cancer Sister        breast cancer   Stroke Mother    Heart attack Brother        MI in his 21s   Heart disease Brother        before age 71   Breast cancer Sister     ROS: no fevers or chills, productive cough, hemoptysis, dysphasia, odynophagia, melena, hematochezia, dysuria, hematuria, rash, seizure activity, orthopnea, PND, pedal edema,  claudication. Remaining systems are negative.  Physical Exam: Well-developed well-nourished in no acute distress.  Skin is warm and dry.  HEENT is normal.  Neck is supple.  Chest is clear to auscultation with normal expansion.  Cardiovascular exam is regular rate and rhythm.  Abdominal exam nontender or distended. No masses palpated. Extremities show no edema. neuro grossly intact  ECG-normal sinus rhythm, first-degree AV block, right axis deviation, nonspecific ST changes, prolonged QT interval.  Personally reviewed  A/P  1 chest pain-patient has had no recurrent episodes.  Nuclear study showed infarct but no ischemia.  No previous echocardiogram showed normal LV function.  Will not pursue further evaluation at this point.  2 pulmonary hypertension-followed at [facility].  Continue sildenafil and Opsumit.  Pulmonary hypertension is felt secondary to previous mitral valve disease as well as group 5 from end-stage renal disease/hemodialysis.  3 mitral valve repair-continue SBE prophylaxis.  4 hypertension-blood pressure controlled.  Continue present medications and follow.  5 hyperlipidemia-continue statin.  6 chronic diastolic congestive heart failure-volume is now managed with dialysis.  8 end-stage renal disease-Per nephrology.  [name], MD

## 2021-12-02 DIAGNOSIS — D631 Anemia in chronic kidney disease: Secondary | ICD-10-CM | POA: Diagnosis not present

## 2021-12-02 DIAGNOSIS — D689 Coagulation defect, unspecified: Secondary | ICD-10-CM | POA: Diagnosis not present

## 2021-12-02 DIAGNOSIS — E118 Type 2 diabetes mellitus with unspecified complications: Secondary | ICD-10-CM | POA: Diagnosis not present

## 2021-12-02 DIAGNOSIS — N186 End stage renal disease: Secondary | ICD-10-CM | POA: Diagnosis not present

## 2021-12-02 DIAGNOSIS — N2581 Secondary hyperparathyroidism of renal origin: Secondary | ICD-10-CM | POA: Diagnosis not present

## 2021-12-02 DIAGNOSIS — D509 Iron deficiency anemia, unspecified: Secondary | ICD-10-CM | POA: Diagnosis not present

## 2021-12-02 DIAGNOSIS — Z992 Dependence on renal dialysis: Secondary | ICD-10-CM | POA: Diagnosis not present

## 2021-12-05 DIAGNOSIS — D689 Coagulation defect, unspecified: Secondary | ICD-10-CM | POA: Diagnosis not present

## 2021-12-05 DIAGNOSIS — N186 End stage renal disease: Secondary | ICD-10-CM | POA: Diagnosis not present

## 2021-12-05 DIAGNOSIS — D631 Anemia in chronic kidney disease: Secondary | ICD-10-CM | POA: Diagnosis not present

## 2021-12-05 DIAGNOSIS — E118 Type 2 diabetes mellitus with unspecified complications: Secondary | ICD-10-CM | POA: Diagnosis not present

## 2021-12-05 DIAGNOSIS — N2581 Secondary hyperparathyroidism of renal origin: Secondary | ICD-10-CM | POA: Diagnosis not present

## 2021-12-05 DIAGNOSIS — D509 Iron deficiency anemia, unspecified: Secondary | ICD-10-CM | POA: Diagnosis not present

## 2021-12-05 DIAGNOSIS — Z992 Dependence on renal dialysis: Secondary | ICD-10-CM | POA: Diagnosis not present

## 2021-12-07 DIAGNOSIS — D689 Coagulation defect, unspecified: Secondary | ICD-10-CM | POA: Diagnosis not present

## 2021-12-07 DIAGNOSIS — D631 Anemia in chronic kidney disease: Secondary | ICD-10-CM | POA: Diagnosis not present

## 2021-12-07 DIAGNOSIS — N186 End stage renal disease: Secondary | ICD-10-CM | POA: Diagnosis not present

## 2021-12-07 DIAGNOSIS — E118 Type 2 diabetes mellitus with unspecified complications: Secondary | ICD-10-CM | POA: Diagnosis not present

## 2021-12-07 DIAGNOSIS — N2581 Secondary hyperparathyroidism of renal origin: Secondary | ICD-10-CM | POA: Diagnosis not present

## 2021-12-07 DIAGNOSIS — Z992 Dependence on renal dialysis: Secondary | ICD-10-CM | POA: Diagnosis not present

## 2021-12-07 DIAGNOSIS — D509 Iron deficiency anemia, unspecified: Secondary | ICD-10-CM | POA: Diagnosis not present

## 2021-12-07 NOTE — Progress Notes (Signed)
Annual Screening/Preventative Visit & Comprehensive Evaluation &  Examination  Future Appointments  Date Time Provider Department  12/08/2021 11:00 AM Unk Pinto, MD [facility]  12/13/2021 10:40 AM Lelon Perla, MD [facility]  01/24/2022 10:30 AM Newton Pigg, [facility]  12/13/2022 11:00 AM Unk Pinto, MD [facility]        This very nice 66 y.o. [name] with ESRD  on Dialysis  since 2018 awaiting renal transplant who  presents for a Screening /Preventative Visit & comprehensive evaluation and management of multiple medical co-morbidities.  Patient has been followed also for HTN, HLD, T2_IDDM  and Vitamin D Deficiency.        HTN predates since  1999. Patient's BP has been controlled at home and patient denies any cardiac symptoms as chest pain, palpitations, shortness of breath, dizziness or ankle swelling. Today's BP is at goal -  114/80.        Patient's hyperlipidemia is not controlled with diet and Rosuvastatin. Patient denies myalgias or other medication SE's. Last lipids were not at goal :  Lab Results  Component Value Date   CHOL 243 (H) 04/21/2021   HDL 47 (L) 04/21/2021   LDLCALC 162 (H) 04/21/2021   TRIG 181 (H) 04/21/2021   CHOLHDL 5.2 (H) 04/21/2021         Patient has hx/o T2_NIDDM predating since 2006 and has most recently used insulin prn  and has tapered off insulin with progressive Insulin sensitivity with loss of renal breakdown. patient denies reactive hypoglycemic symptoms, visual blurring, diabetic polys or paresthesias. Last A1c was at goal :  Lab Results  Component Value Date   HGBA1C 5.0 04/21/2021         Finally, patient has history of Vitamin D Deficiency ("12" /2008) and last Vitamin D was still low :  Lab Results  Component Value Date   VD25OH 36 10/30/2019     Current Outpatient Medications on File Prior to Visit  Medication Sig   acetaminophen (TYLENOL) 500 MG tablet Take  as needed    apixaban (ELIQUIS) 5 MG TABS  tablet Take 5 mg  2  times daily.   aspirin EC 81 MG tablet Take daily.   carvedilol  25 MG tablet Take 25 mg daily.   cinacalcet (SENSIPAR) 60 MG tablet Takes at dialysis M/W/F   hydrALAZINE  50 MG tablet Take 1 tablet  3 times daily   XALATAN 0.005 % ophth soln Place 1 drop into both eyes at bedtime.   losartan (COZAAR) 50 MG tablet Take 1 tablet Daily for BP    macitentan (OPSUMIT) 10 MG tablet Take 1  daily.   Multiple Vitamins-Minerals (ZINC PO) Take 1 tablet  daily.   pantoprazole (PROTONIX) 40 MG tablet Take 1 tablet   daily.   RENVELA 800 MG tablet Take 1,600 mg (2 caps) with every meal   rosuvastatin (CRESTOR) 20 MG tablet Take 1 tablet Daily   sertraline (ZOLOFT) 25 MG tablet Take 1 tablet Daily   sildenafil (REVATIO) 20 MG tablet Take 2 tablets (40 mg) 3 x  /day      Allergies  Allergen Reactions   Minoxidil Other (See Comments)    Pericardial effusion   Lipitor [Atorvastatin] Other (See Comments)    MYALGIAS > "pain in legs" Tolerates rosuvastatin   Morphine And Related Itching   Ace Inhibitors Other (See Comments)    REACTION: "not sure...think it made me drowsy all the time"     Past Medical History:  Diagnosis Date  Anemia    Chronic diastolic CHF (congestive heart failure) (HCC)    Chronic diastolic heart failure (HCC)    dialysis M/W/F   Constipation    CVA (cerebral infarction) 1997   no residual deficit   Diverticulitis    DM (diabetes mellitus) (HCC)    type 2   Heart murmur    History of cardiovascular stress test    a. Myoview Oct 2012 showed EF 49%, no ischemia, LVE   HLD (hyperlipidemia)    takes Crestor daily   Hypertension    Pericardial effusion    chronic; felt to be poss related to minoxidil >> DC'd   Pulmonary hypertension ([place]) 06/18/2015   Respiratory failure, acute hypoxic, post-operative 07/08/2015   Requiring ECMO support   S/P cardiac catheterization    a. R/L [facility] 06/18/15:  mLAD 30%; severe pulmo HTN with PA sat 43%, CI 1.86,  prominent V waves indicative of MR; resting hypoxemia O2 sat 86% on RA   S/P minimally invasive mitral valve repair 07/08/2015   Complex valvuloplasty including artificial Gore-tex neochord placement x10 and 26 mm Sorin Memo 3D Rechord ring annuloplasty via right mini thoracotomy approach   Severe mitral regurgitation    Shortness of breath dyspnea    Stroke ([place]) 1997   no residual effect   Vitamin D deficiency    Wears glasses      Health Maintenance  Topic Date Due   Zoster Vaccines- Shingrix (1 of 2) Never done   COVID-19 Vaccine (3 - Moderna risk series) 04/02/2020   OPHTHALMOLOGY EXAM  08/25/2020   INFLUENZA VACCINE  06/27/2021   MAMMOGRAM  09/17/2021   HEMOGLOBIN A1C  10/22/2021   DEXA SCAN  04/21/2022 (Originally 03/04/2020)   COLONOSCOPY (Pts 45-50yrs Insurance coverage will need to be confirmed)  01/18/2022   FOOT EXAM  04/21/2022   Pneumonia Vaccine 59+ Years old (4 - PPSV23 if available, else PCV20) 09/11/2022   TETANUS/TDAP  01/23/2029   Hepatitis C Screening  Completed   HPV VACCINES  Aged Out     Immunization History  Administered Date(s) Administered   Hepatitis B, adult 10/25/2015, 11/24/2015, 12/27/2015   Influenza Split 08/20/2012, 08/15/2017   Influenza, Quadrivalent, Recombinant 08/25/2019   Influenza-Unspecified 09/22/2020   Moderna Sars-Covid-2 Vacc 02/06/2020, 03/05/2020   PPD Test 03/25/2014, 03/29/2015, 09/03/2015   Pneumococcal -13 03/19/2019   Pneumococcal-23 09/18/2016, 09/11/2017   Pneumococcal-23 07/28/2008   Td 01/23/2019   Tdap 12/28/2008     Last Colon -  01/19/2012 - Dr Collene Mares - Recc 10 yr F/U - due Mar 2023   Last MGM -  09/18/2019    Past Surgical History:  Procedure Laterality Date   A/V FISTULAGRAM Right 08/16/2017   Procedure: A/V Fistulagram;  Surgeon: Conrad Canal Fulton, MD;  Location: [place] [facility];  Service: Cardiovascular;  Laterality: Right;   AV FISTULA PLACEMENT Left 10/22/2015   Procedure: RADIOCEPHALIC ARTERIOVENOUS  (AV) FISTULA CREATION;  Surgeon: Serafina Mitchell, MD;  Location: [place] [facility];  Service: Vascular;  Laterality: Left;   AV FISTULA PLACEMENT Right 05/22/2017   Procedure: ARTERIOVENOUS (AV) FISTULA CREATION-RIGHT ARM;  Surgeon: Waynetta Sandy, MD;  Location: [place];  Service: Vascular;  Laterality: Right;   CANNULATION FOR CARDIOPULMONARY BYPASS N/A 07/08/2015   Procedure: CANNULATION FOR ECMO;  Surgeon: Rexene Alberts, MD;  Location: [place];  Service: Open Heart Surgery;  Laterality: N/A;   CARDIAC CATHETERIZATION     CARDIAC CATHETERIZATION N/A 06/18/2015   Procedure: Right/Left Heart Cath and  Coronary Angiography;  Surgeon: Jettie Booze, MD;  Location: [place] [facility];  Service: Cardiovascular;  Laterality: N/A;   CESAREAN SECTION     x 2   COLONOSCOPY     EXCHANGE OF A DIALYSIS CATHETER N/A 06/28/2017   Procedure: EXCHANGE OF A DIALYSIS CATHETER;  Surgeon: Waynetta Sandy, MD;  Location: [place];  Service: Vascular;  Laterality: N/A;   FISTULA SUPERFICIALIZATION Left 12/28/2015   Procedure: SUPERFICIALIZATION LEFT RADIOCEPHALIC FISTULA;  Surgeon: Serafina Mitchell, MD;  Location: [place];  Service: Vascular;  Laterality: Left;   FISTULA SUPERFICIALIZATION Right 08/21/2017   Procedure: FISTULA SUPERFICIALIZATION RIGHT ARM;  Surgeon: Waynetta Sandy, MD;  Location: [place];  Service: Vascular;  Laterality: Right;   INSERTION OF DIALYSIS CATHETER Right 04/28/2017   Procedure: INSERTION OF DIALYSIS CATHETER-RIGHT INTERNAL JUGULAR PLACEMENT;  Surgeon: Elam Dutch, MD;  Location: [place];  Service: Vascular;  Laterality: Right;   LIGATION OF ARTERIOVENOUS  FISTULA Left 04/28/2017   Procedure: LIGATION OF ARTERIOVENOUS  FISTULA;  Surgeon: Elam Dutch, MD;  Location: [place];  Service: Vascular;  Laterality: Left;   MITRAL VALVE REPAIR Right 07/08/2015   Procedure: MINIMALLY INVASIVE MITRAL VALVE REPAIR with a 26 Sorin Memo 3D Rechord;  Surgeon: Rexene Alberts, MD;  Location:  [place];  Service: Open Heart Surgery;  Laterality: Right;   MULTIPLE EXTRACTIONS WITH ALVEOLOPLASTY N/A 06/30/2015   Procedure: MULTIPLE EXTRACTIONS OF TOOTH #'S 4  AND 30 WITH ALVEOLOPLASTY AND GROSS DEBRIDEMENT  OF REMAINING TEETH;  Surgeon: Lenn Cal, DDS;  Location: [place];  Service: Oral Surgery;  Laterality: N/A;   PERIPHERAL VASCULAR CATHETERIZATION Left 03/28/2016   Procedure: A/V Fistulagram;  Surgeon: Serafina Mitchell, MD;  Location: [place] [facility];  Service: Cardiovascular;  Laterality: Left;  lower arm   TEE WITHOUT CARDIOVERSION N/A 06/08/2015   Procedure: TRANSESOPHAGEAL ECHOCARDIOGRAM (TEE);  Surgeon: Lelon Perla, MD;  Location[facility];  Service: Cardiovascular;  Laterality: N/A;   TEE WITHOUT CARDIOVERSION N/A 07/08/2015   Procedure: TRANSESOPHAGEAL ECHOCARDIOGRAM (TEE);  Surgeon: Rexene Alberts, MD;  Location: [place];  Service: Open Heart Surgery;  Laterality: N/A;     Family History  Problem Relation Age of Onset   Cancer Sister        breast cancer   Stroke Mother    Heart attack Brother        MI in his 41s   Heart disease Brother        before age 85   Breast cancer Sister      Social History   Tobacco Use   Smoking status: Never   Smokeless tobacco: Never  Vaping Use   Vaping Use: Never used  Substance Use Topics   Alcohol use: No    Alcohol/week: 0.0 standard drinks   Drug use: No      ROS Constitutional: Denies fever, chills, weight loss/gain, headaches, insomnia,  night sweats, and change in appetite. Does c/o fatigue. Eyes: Denies redness, blurred vision, diplopia, discharge, itchy, watery eyes.  ENT: Denies discharge, congestion, post nasal drip, epistaxis, sore throat, earache, hearing loss, dental pain, Tinnitus, Vertigo, Sinus pain, snoring.  Cardio: Denies chest pain, palpitations, irregular heartbeat, syncope, dyspnea, diaphoresis, orthopnea, PND, claudication, edema Respiratory: denies cough, dyspnea, DOE, pleurisy,  hoarseness, laryngitis, wheezing.  Gastrointestinal: Denies dysphagia, heartburn, reflux, water brash, pain, cramps, nausea, vomiting, bloating, diarrhea, constipation, hematemesis, melena, hematochezia, jaundice, hemorrhoids Genitourinary: Denies dysuria, frequency, urgency, nocturia, hesitancy, discharge, hematuria, flank  pain Breast: Breast lumps, nipple discharge, bleeding.  Musculoskeletal: Denies arthralgia, myalgia, stiffness, Jt. Swelling, pain, limp, and strain/sprain. Denies falls. Skin: Denies puritis, rash, hives, warts, acne, eczema, changing in skin lesion Neuro: No weakness, tremor, incoordination, spasms, paresthesia, pain Psychiatric: Denies confusion, memory loss, sensory loss. Denies Depression. Endocrine: Denies change in weight, skin, hair change, nocturia, and paresthesia, diabetic polys, visual blurring, hyper / hypo glycemic episodes.  Heme/Lymph: No excessive bleeding, bruising, enlarged lymph nodes.  Physical Exam  BP 114/80    Pulse 78    Temp 97.9 F (36.6 C)    Resp 16    Ht 4\' 11"  (1.499 m)    Wt 168 lb 12.8 oz (76.6 kg)    SpO2 95%    BMI 34.09 kg/m   General Appearance: Well nourished, well groomed and in no apparent distress.  Eyes: PERRLA, EOMs Nl, PERRLA, Fundi not well visualized Sinuses: No frontal/maxillary tenderness ENT/Mouth: EACs patent / TMs  nl. Nares clear without erythema, swelling, mucoid exudates. Oral hygiene is good. No erythema, swelling, or exudate. Tongue normal, non-obstructing. Tonsils not swollen or erythematous. Hearing normal.  Neck: Supple, thyroid not palpable. No bruits, nodes or JVD. Respiratory: Respiratory effort normal.  BS equal and clear bilateral without rales, rhonci, wheezing or stridor. Cardio: Heart sounds are normal with regular rate and rhythm and no murmurs, rubs or gallops. Peripheral pulses are normal and equal bilaterally without edema. No aortic or femoral bruits. Chest: symmetric with normal excursions and  percussion. Breasts: Symmetric, without lumps, nipple discharge, retractions, or fibrocystic changes.  Abdomen: Flat, soft with bowel sounds active. Nontender, no guarding, rebound, hernias, masses, or organomegaly.  Lymphatics: Non tender without lymphadenopathy.  Musculoskeletal: Full ROM all peripheral extremities, joint stability, 5/5 strength, and normal gait. Skin: Warm and dry without rashes, lesions, cyanosis, clubbing or  ecchymosis.  Neuro: Cranial nerves intact, reflexes equal bilaterally. Normal muscle tone, no cerebellar symptoms. Sensation intact.  Pysch: Alert and oriented X 3, normal affect, Insight and Judgment appropriate.    Assessment and Plan  1. Annual Preventative Screening Examination  2. Essential hypertension  - EKG 12-Lead - CBC with Differential/Platelet - COMPLETE METABOLIC PANEL WITH GFR - TSH  3. Hyperlipidemia associated with type 2 diabetes mellitus ([place])  - EKG 12-Lead - Lipid panel - TSH  4. Type 2 diabetes mellitus with chronic kidney disease on  chronic dialysis, with long-term current use of insulin (HCC)  - EKG 12-Lead - HM DIABETES FOOT EXAM - PR LOW EXTEMITY NEUR EXAM DOCUM - COMPLETE METABOLIC PANEL WITH GFR  5. ESRD on dialysis ([place])  - EKG 12-Lead - COMPLETE METABOLIC PANEL WITH GFR  6. Pulmonary hypertension (HCC)  - EKG 12-Lead  7. Secondary renal hyperparathyroidism (HCC)  - COMPLETE METABOLIC PANEL WITH GFR  8. Anemia in chronic kidney disease, on chronic dialysis (HCC)  - CBC with Differential/Platelet  9. Vitamin D deficiency  - VITAMIN D 25 Hydroxy   10. Persistent atrial fibrillation (HCC)  - EKG 12-Lead  11. Screening for heart disease  - EKG 12-Lead  12. FHx: heart disease  - EKG 12-Lead  13. Medication management  - CBC with Differential/Platelet - COMPLETE METABOLIC PANEL WITH GFR - Lipid panel - TSH - Hemoglobin A1c - Insulin, random - VITAMIN D 25 Hydroxy   14. Screening for  colorectal cancer  - POC Hemoccult Bld/Stl  15. Paroxysmal atrial fibrillation ([place])          Patient was counseled in prudent diet to achieve/maintain  BMI less than 25 for weight control, BP monitoring, regular exercise and medications. Discussed med's effects and SE's. Screening labs and tests as requested with regular follow-up as recommended. Over 40 minutes of exam, counseling, chart review and high complex critical decision making was performed.   Kirtland Bouchard, MD

## 2021-12-08 ENCOUNTER — Ambulatory Visit (INDEPENDENT_AMBULATORY_CARE_PROVIDER_SITE_OTHER): Payer: Medicare Other | Admitting: Internal Medicine

## 2021-12-08 ENCOUNTER — Encounter: Payer: Self-pay | Admitting: Internal Medicine

## 2021-12-08 ENCOUNTER — Other Ambulatory Visit: Payer: Self-pay

## 2021-12-08 VITALS — BP 114/80 | HR 78 | Temp 97.9°F | Resp 16 | Ht 59.0 in | Wt 168.8 lb

## 2021-12-08 DIAGNOSIS — Z79899 Other long term (current) drug therapy: Secondary | ICD-10-CM | POA: Diagnosis not present

## 2021-12-08 DIAGNOSIS — Z794 Long term (current) use of insulin: Secondary | ICD-10-CM

## 2021-12-08 DIAGNOSIS — I272 Pulmonary hypertension, unspecified: Secondary | ICD-10-CM | POA: Diagnosis not present

## 2021-12-08 DIAGNOSIS — N186 End stage renal disease: Secondary | ICD-10-CM | POA: Diagnosis not present

## 2021-12-08 DIAGNOSIS — Z992 Dependence on renal dialysis: Secondary | ICD-10-CM

## 2021-12-08 DIAGNOSIS — I4819 Other persistent atrial fibrillation: Secondary | ICD-10-CM | POA: Diagnosis not present

## 2021-12-08 DIAGNOSIS — N2581 Secondary hyperparathyroidism of renal origin: Secondary | ICD-10-CM | POA: Diagnosis not present

## 2021-12-08 DIAGNOSIS — E1122 Type 2 diabetes mellitus with diabetic chronic kidney disease: Secondary | ICD-10-CM | POA: Diagnosis not present

## 2021-12-08 DIAGNOSIS — E785 Hyperlipidemia, unspecified: Secondary | ICD-10-CM | POA: Diagnosis not present

## 2021-12-08 DIAGNOSIS — Z8249 Family history of ischemic heart disease and other diseases of the circulatory system: Secondary | ICD-10-CM

## 2021-12-08 DIAGNOSIS — Z1211 Encounter for screening for malignant neoplasm of colon: Secondary | ICD-10-CM

## 2021-12-08 DIAGNOSIS — E1169 Type 2 diabetes mellitus with other specified complication: Secondary | ICD-10-CM

## 2021-12-08 DIAGNOSIS — I10 Essential (primary) hypertension: Secondary | ICD-10-CM | POA: Diagnosis not present

## 2021-12-08 DIAGNOSIS — Z0001 Encounter for general adult medical examination with abnormal findings: Secondary | ICD-10-CM

## 2021-12-08 DIAGNOSIS — E559 Vitamin D deficiency, unspecified: Secondary | ICD-10-CM

## 2021-12-08 DIAGNOSIS — Z136 Encounter for screening for cardiovascular disorders: Secondary | ICD-10-CM | POA: Diagnosis not present

## 2021-12-08 DIAGNOSIS — I480 Paroxysmal atrial fibrillation: Secondary | ICD-10-CM | POA: Diagnosis not present

## 2021-12-08 DIAGNOSIS — Z0000 Encounter for general adult medical examination without abnormal findings: Secondary | ICD-10-CM

## 2021-12-08 DIAGNOSIS — D631 Anemia in chronic kidney disease: Secondary | ICD-10-CM

## 2021-12-08 NOTE — Patient Instructions (Signed)

## 2021-12-09 DIAGNOSIS — E118 Type 2 diabetes mellitus with unspecified complications: Secondary | ICD-10-CM | POA: Diagnosis not present

## 2021-12-09 DIAGNOSIS — D509 Iron deficiency anemia, unspecified: Secondary | ICD-10-CM | POA: Diagnosis not present

## 2021-12-09 DIAGNOSIS — N186 End stage renal disease: Secondary | ICD-10-CM | POA: Diagnosis not present

## 2021-12-09 DIAGNOSIS — N2581 Secondary hyperparathyroidism of renal origin: Secondary | ICD-10-CM | POA: Diagnosis not present

## 2021-12-09 DIAGNOSIS — D631 Anemia in chronic kidney disease: Secondary | ICD-10-CM | POA: Diagnosis not present

## 2021-12-09 DIAGNOSIS — Z992 Dependence on renal dialysis: Secondary | ICD-10-CM | POA: Diagnosis not present

## 2021-12-09 DIAGNOSIS — D689 Coagulation defect, unspecified: Secondary | ICD-10-CM | POA: Diagnosis not present

## 2021-12-09 LAB — HEMOGLOBIN A1C
Hgb A1c MFr Bld: 6.1 % of total Hgb — ABNORMAL HIGH (ref <5.7)
Mean Plasma Glucose: 128 mg/dL
eAG (mmol/L): 7.1 mmol/L

## 2021-12-09 LAB — COMPLETE METABOLIC PANEL WITH GFR
AG Ratio: 1.2 (calc) (ref 1.0–2.5)
ALT: 19 U/L (ref 6–29)
AST: 16 U/L (ref 10–35)
Albumin: 4.1 g/dL (ref 3.6–5.1)
Alkaline phosphatase (APISO): 117 U/L (ref 37–153)
BUN/Creatinine Ratio: 4 (calc) — ABNORMAL LOW (ref 6–22)
BUN: 27 mg/dL — ABNORMAL HIGH (ref 7–25)
CO2: 35 mmol/L — ABNORMAL HIGH (ref 20–32)
Calcium: 9 mg/dL (ref 8.6–10.4)
Chloride: 92 mmol/L — ABNORMAL LOW (ref 98–110)
Creat: 7.21 mg/dL — ABNORMAL HIGH (ref 0.50–1.05)
Globulin: 3.4 g/dL (calc) (ref 1.9–3.7)
Glucose, Bld: 148 mg/dL — ABNORMAL HIGH (ref 65–99)
Potassium: 4.2 mmol/L (ref 3.5–5.3)
Sodium: 140 mmol/L (ref 135–146)
Total Bilirubin: 0.6 mg/dL (ref 0.2–1.2)
Total Protein: 7.5 g/dL (ref 6.1–8.1)
eGFR: 6 mL/min/{1.73_m2} — ABNORMAL LOW (ref >=60)

## 2021-12-09 LAB — CBC WITH DIFFERENTIAL/PLATELET
Absolute Monocytes: 522 cells/uL (ref 200–950)
Basophils Absolute: 40 cells/uL (ref 0–200)
Basophils Relative: 1.1 %
Eosinophils Absolute: 101 cells/uL (ref 15–500)
Eosinophils Relative: 2.8 %
HCT: 31.8 % — ABNORMAL LOW (ref 35.0–45.0)
Hemoglobin: 9.8 g/dL — ABNORMAL LOW (ref 11.7–15.5)
Lymphs Abs: 1127 cells/uL (ref 850–3900)
MCH: 26.3 pg — ABNORMAL LOW (ref 27.0–33.0)
MCHC: 30.8 g/dL — ABNORMAL LOW (ref 32.0–36.0)
MCV: 85.5 fL (ref 80.0–100.0)
MPV: 12.9 fL — ABNORMAL HIGH (ref 7.5–12.5)
Monocytes Relative: 14.5 %
Neutro Abs: 1811 cells/uL (ref 1500–7800)
Neutrophils Relative %: 50.3 %
Platelets: 144 10*3/uL (ref 140–400)
RBC: 3.72 10*6/uL — ABNORMAL LOW (ref 3.80–5.10)
RDW: 16.7 % — ABNORMAL HIGH (ref 11.0–15.0)
Total Lymphocyte: 31.3 %
WBC: 3.6 10*3/uL — ABNORMAL LOW (ref 3.8–10.8)

## 2021-12-09 LAB — LIPID PANEL
Cholesterol: 227 mg/dL — ABNORMAL HIGH (ref <200)
HDL: 50 mg/dL (ref >=50)
LDL Cholesterol (Calc): 155 mg/dL (calc) — ABNORMAL HIGH
Non-HDL Cholesterol (Calc): 177 mg/dL (calc) — ABNORMAL HIGH (ref <130)
Total CHOL/HDL Ratio: 4.5 (calc) (ref <5.0)
Triglycerides: 103 mg/dL (ref <150)

## 2021-12-09 LAB — TSH: TSH: 2.67 mIU/L (ref 0.40–4.50)

## 2021-12-09 LAB — INSULIN, RANDOM: Insulin: 48.2 u[IU]/mL — ABNORMAL HIGH

## 2021-12-09 LAB — VITAMIN D 25 HYDROXY (VIT D DEFICIENCY, FRACTURES): Vit D, 25-Hydroxy: 32 ng/mL (ref 30–100)

## 2021-12-10 NOTE — Progress Notes (Signed)
============================================================ ============================================================  -  CBC shows your chronic anemia due to your Kidney Disease is Stable  ============================================================ ============================================================  -   -  Total  Chol =    227    - Elevated             (  Ideal  or  Goal is less than 180  !  )   - and   -  Bad / Dangerous LDL  Chol =   155   - also Elevated              (  Ideal  or  Goal is less than 70  !  )   - Both are way too high   -  ARE you taking your Rosuvastatin / Crestor     EVERY DAY  ? ============================================================ ============================================================  -  is Horrible - Adding more medicines will not fix the problem -  it will only make you gain more weight and  make your diabetes worse  - You must take responsibility and get on a better diet &  lose weight   - Recommend the book "The END of DIETING" by  Dr Excell Seltzer   & the book "The END of DIABETES " by Dr Excell Seltzer  Can get at any book store or can get at [email or web address]  - get book & Audio CD's   Being diabetic has a 300% increased risk for heart attack,  stroke, cancer, and alzheimer- type vascular dementia.  It is very important that you work harder with diet by  avoiding all foods that are white.   Avoid white rice  (brown & wild rice is OK),   Avoid white potatoes  (sweet potatoes in moderation is OK),   Avoid white bread or wheat bread or anything made out of   white flour like bagels, donuts, rolls, buns, biscuits, cakes,  pastries, cookies, pizza crust, and pasta  (made from white flour & egg whites)   - vegetarian pasta or spinach or wheat pasta is OK.   Multigrain breads like Arnold's or Pepper[employer] or   multigrain sandwich thins or flatbreads.   Diet, exercise and weight loss can reverse and cure   diabetes in the early stages.   Diet, exercise and weight loss is very important in the  control and prevention of complications of diabetes which   affects every system in your body,   ie. Brain - dementia/stroke,  - eyes - glaucoma/blindness,  - heart - heart attack/heart failure,  - kidneys - dialysis,  - stomach- gastric paralysis,  - intestines - malabsorption,  - nerves - severe painful neuritis,  - circulation - gangrene & loss of a leg(s) and finally  - cancer and Alzheimers. ============================================================ ============================================================  -  A1c = 6.1%  - is going back up again - So need to be on a stricter diet  ============================================================ ============================================================  -  Vitamin D = 32   - Vitamin D goal is between 70-100.   - Recommend that you take  Vitamin D 5,000 unit capsule EVERY  DAY   - It is very important as a natural anti-inflammatory and helping the  immune system protect against viral infections, like the Covid-19    helping hair, skin, and nails, as well as reducing stroke and  heart attack risk.   - It helps your bones and helps with mood.  - It also decreases numerous cancer risks so please  take it  as directed.   - Low Vit D is associated with a 200-300% higher risk for  CANCER   and 200-300% higher risk for HEART   ATTACK  &  STROKE.    - It is also associated with higher death rate at younger ages,   autoimmune diseases like Rheumatoid arthritis, Lupus,  Multiple Sclerosis.     - Also many other serious conditions, like depression, Alzheimer's  Dementia, infertility, muscle aches, fatigue, fibromyalgia   - just to name a few. ============================================================ ============================================================  -  All Else -  Liver - Magnesium & Thyroid    - all  Normal  / OK ============================================================ ============================================================

## 2021-12-12 DIAGNOSIS — N2581 Secondary hyperparathyroidism of renal origin: Secondary | ICD-10-CM | POA: Diagnosis not present

## 2021-12-12 DIAGNOSIS — D631 Anemia in chronic kidney disease: Secondary | ICD-10-CM | POA: Diagnosis not present

## 2021-12-12 DIAGNOSIS — Z992 Dependence on renal dialysis: Secondary | ICD-10-CM | POA: Diagnosis not present

## 2021-12-12 DIAGNOSIS — E118 Type 2 diabetes mellitus with unspecified complications: Secondary | ICD-10-CM | POA: Diagnosis not present

## 2021-12-12 DIAGNOSIS — D509 Iron deficiency anemia, unspecified: Secondary | ICD-10-CM | POA: Diagnosis not present

## 2021-12-12 DIAGNOSIS — N186 End stage renal disease: Secondary | ICD-10-CM | POA: Diagnosis not present

## 2021-12-12 DIAGNOSIS — D689 Coagulation defect, unspecified: Secondary | ICD-10-CM | POA: Diagnosis not present

## 2021-12-13 ENCOUNTER — Ambulatory Visit: Payer: Medicare Other | Admitting: Cardiology

## 2021-12-13 DIAGNOSIS — E559 Vitamin D deficiency, unspecified: Secondary | ICD-10-CM | POA: Diagnosis not present

## 2021-12-13 DIAGNOSIS — Z8673 Personal history of transient ischemic attack (TIA), and cerebral infarction without residual deficits: Secondary | ICD-10-CM | POA: Diagnosis not present

## 2021-12-13 DIAGNOSIS — Z794 Long term (current) use of insulin: Secondary | ICD-10-CM | POA: Diagnosis not present

## 2021-12-13 DIAGNOSIS — Z79899 Other long term (current) drug therapy: Secondary | ICD-10-CM | POA: Diagnosis not present

## 2021-12-13 DIAGNOSIS — Z7901 Long term (current) use of anticoagulants: Secondary | ICD-10-CM | POA: Diagnosis not present

## 2021-12-13 DIAGNOSIS — I2510 Atherosclerotic heart disease of native coronary artery without angina pectoris: Secondary | ICD-10-CM | POA: Diagnosis not present

## 2021-12-13 DIAGNOSIS — N186 End stage renal disease: Secondary | ICD-10-CM | POA: Diagnosis not present

## 2021-12-13 DIAGNOSIS — E1122 Type 2 diabetes mellitus with diabetic chronic kidney disease: Secondary | ICD-10-CM | POA: Diagnosis not present

## 2021-12-13 DIAGNOSIS — I5030 Unspecified diastolic (congestive) heart failure: Secondary | ICD-10-CM | POA: Diagnosis not present

## 2021-12-13 DIAGNOSIS — N185 Chronic kidney disease, stage 5: Secondary | ICD-10-CM | POA: Diagnosis not present

## 2021-12-13 DIAGNOSIS — Z992 Dependence on renal dialysis: Secondary | ICD-10-CM | POA: Diagnosis not present

## 2021-12-13 DIAGNOSIS — I2729 Other secondary pulmonary hypertension: Secondary | ICD-10-CM | POA: Diagnosis not present

## 2021-12-13 DIAGNOSIS — I132 Hypertensive heart and chronic kidney disease with heart failure and with stage 5 chronic kidney disease, or end stage renal disease: Secondary | ICD-10-CM | POA: Diagnosis not present

## 2021-12-14 DIAGNOSIS — D689 Coagulation defect, unspecified: Secondary | ICD-10-CM | POA: Diagnosis not present

## 2021-12-14 DIAGNOSIS — D509 Iron deficiency anemia, unspecified: Secondary | ICD-10-CM | POA: Diagnosis not present

## 2021-12-14 DIAGNOSIS — Z992 Dependence on renal dialysis: Secondary | ICD-10-CM | POA: Diagnosis not present

## 2021-12-14 DIAGNOSIS — D631 Anemia in chronic kidney disease: Secondary | ICD-10-CM | POA: Diagnosis not present

## 2021-12-14 DIAGNOSIS — N2581 Secondary hyperparathyroidism of renal origin: Secondary | ICD-10-CM | POA: Diagnosis not present

## 2021-12-14 DIAGNOSIS — E118 Type 2 diabetes mellitus with unspecified complications: Secondary | ICD-10-CM | POA: Diagnosis not present

## 2021-12-14 DIAGNOSIS — N186 End stage renal disease: Secondary | ICD-10-CM | POA: Diagnosis not present

## 2021-12-16 DIAGNOSIS — N186 End stage renal disease: Secondary | ICD-10-CM | POA: Diagnosis not present

## 2021-12-16 DIAGNOSIS — D631 Anemia in chronic kidney disease: Secondary | ICD-10-CM | POA: Diagnosis not present

## 2021-12-16 DIAGNOSIS — N2581 Secondary hyperparathyroidism of renal origin: Secondary | ICD-10-CM | POA: Diagnosis not present

## 2021-12-16 DIAGNOSIS — D689 Coagulation defect, unspecified: Secondary | ICD-10-CM | POA: Diagnosis not present

## 2021-12-16 DIAGNOSIS — Z992 Dependence on renal dialysis: Secondary | ICD-10-CM | POA: Diagnosis not present

## 2021-12-16 DIAGNOSIS — E118 Type 2 diabetes mellitus with unspecified complications: Secondary | ICD-10-CM | POA: Diagnosis not present

## 2021-12-16 DIAGNOSIS — D509 Iron deficiency anemia, unspecified: Secondary | ICD-10-CM | POA: Diagnosis not present

## 2021-12-19 DIAGNOSIS — Z992 Dependence on renal dialysis: Secondary | ICD-10-CM | POA: Diagnosis not present

## 2021-12-19 DIAGNOSIS — N186 End stage renal disease: Secondary | ICD-10-CM | POA: Diagnosis not present

## 2021-12-19 DIAGNOSIS — E118 Type 2 diabetes mellitus with unspecified complications: Secondary | ICD-10-CM | POA: Diagnosis not present

## 2021-12-19 DIAGNOSIS — D689 Coagulation defect, unspecified: Secondary | ICD-10-CM | POA: Diagnosis not present

## 2021-12-19 DIAGNOSIS — D509 Iron deficiency anemia, unspecified: Secondary | ICD-10-CM | POA: Diagnosis not present

## 2021-12-19 DIAGNOSIS — N2581 Secondary hyperparathyroidism of renal origin: Secondary | ICD-10-CM | POA: Diagnosis not present

## 2021-12-19 DIAGNOSIS — D631 Anemia in chronic kidney disease: Secondary | ICD-10-CM | POA: Diagnosis not present

## 2021-12-20 ENCOUNTER — Emergency Department (HOSPITAL_COMMUNITY)
Admission: EM | Admit: 2021-12-20 | Discharge: 2021-12-21 | Disposition: A | Discharge status: Home / Self Care | Payer: Medicare Other | Attending: Emergency Medicine | Admitting: Emergency Medicine

## 2021-12-20 ENCOUNTER — Other Ambulatory Visit: Payer: Self-pay

## 2021-12-20 ENCOUNTER — Emergency Department (HOSPITAL_COMMUNITY): Payer: Medicare Other

## 2021-12-20 ENCOUNTER — Encounter (HOSPITAL_COMMUNITY): Payer: Self-pay | Admitting: Emergency Medicine

## 2021-12-20 DIAGNOSIS — I517 Cardiomegaly: Secondary | ICD-10-CM | POA: Diagnosis not present

## 2021-12-20 DIAGNOSIS — I120 Hypertensive chronic kidney disease with stage 5 chronic kidney disease or end stage renal disease: Secondary | ICD-10-CM | POA: Diagnosis not present

## 2021-12-20 DIAGNOSIS — R0600 Dyspnea, unspecified: Secondary | ICD-10-CM | POA: Diagnosis not present

## 2021-12-20 DIAGNOSIS — N186 End stage renal disease: Secondary | ICD-10-CM | POA: Insufficient documentation

## 2021-12-20 DIAGNOSIS — R0602 Shortness of breath: Secondary | ICD-10-CM | POA: Diagnosis not present

## 2021-12-20 DIAGNOSIS — Z992 Dependence on renal dialysis: Secondary | ICD-10-CM | POA: Diagnosis not present

## 2021-12-20 DIAGNOSIS — I5032 Chronic diastolic (congestive) heart failure: Secondary | ICD-10-CM | POA: Diagnosis not present

## 2021-12-20 DIAGNOSIS — Z7982 Long term (current) use of aspirin: Secondary | ICD-10-CM | POA: Diagnosis not present

## 2021-12-20 DIAGNOSIS — E1122 Type 2 diabetes mellitus with diabetic chronic kidney disease: Secondary | ICD-10-CM | POA: Diagnosis not present

## 2021-12-20 DIAGNOSIS — I132 Hypertensive heart and chronic kidney disease with heart failure and with stage 5 chronic kidney disease, or end stage renal disease: Secondary | ICD-10-CM | POA: Diagnosis not present

## 2021-12-20 DIAGNOSIS — Z20822 Contact with and (suspected) exposure to covid-19: Secondary | ICD-10-CM | POA: Insufficient documentation

## 2021-12-20 DIAGNOSIS — R079 Chest pain, unspecified: Secondary | ICD-10-CM | POA: Diagnosis not present

## 2021-12-20 DIAGNOSIS — D696 Thrombocytopenia, unspecified: Secondary | ICD-10-CM | POA: Insufficient documentation

## 2021-12-20 DIAGNOSIS — D649 Anemia, unspecified: Secondary | ICD-10-CM | POA: Diagnosis not present

## 2021-12-20 DIAGNOSIS — N189 Chronic kidney disease, unspecified: Secondary | ICD-10-CM

## 2021-12-20 LAB — RESP PANEL BY RT-PCR (FLU A&B, COVID) ARPGX2
Influenza A by PCR: NEGATIVE
Influenza B by PCR: NEGATIVE
SARS Coronavirus 2 by RT PCR: NEGATIVE

## 2021-12-20 LAB — BRAIN NATRIURETIC PEPTIDE: B Natriuretic Peptide: 1753.1 pg/mL — ABNORMAL HIGH (ref 0.0–100.0)

## 2021-12-20 NOTE — ED Triage Notes (Addendum)
Patient reports SOB this evening with occasional dry cough , no fever or chills . HD 3x/week. She did not miss HD treatment .

## 2021-12-20 NOTE — ED Provider Triage Note (Signed)
Emergency Medicine Provider Triage Evaluation Note  Jody Nguyen , a 66 y.o. female  was evaluated in triage.  Pt complains of CP/SOB onset around 8:15 when she was laying down. States she's had similar symptoms in the past. Last time (3 wks ago) was more LH.   States she feels much improved now.   Monday was last dialysis session. Full session.   Only a little bit of residual pain now  Review of Systems  Positive: CP, sob Negative: Fever   Physical Exam  BP 126/87 (BP Location: Right Arm)    Pulse (!) 107    Temp 98.6 F (37 C) (Oral)    Resp 20    Ht 4' 11.5" (1.511 m)    Wt 81 kg    SpO2 97%    BMI 35.46 kg/m  Gen:   Awake, no distress   Resp:  Normal effort  MSK:   Moves extremities without difficulty  Other:  RRR. Fistula in R forearm.   Medical Decision Making  Medically screening exam initiated at 10:36 PM.  Appropriate orders placed.  Jody Nguyen was informed that the remainder of the evaluation will be completed by another provider, this initial triage assessment does not replace that evaluation, and the importance of remaining in the ED until their evaluation is complete.  CP workup.   Pati Gallo [place], [place] 12/20/21 [id number]

## 2021-12-21 LAB — CBC
HCT: 33.1 % — ABNORMAL LOW (ref 36.0–46.0)
Hemoglobin: 10.6 g/dL — ABNORMAL LOW (ref 12.0–15.0)
MCH: 27.5 pg (ref 26.0–34.0)
MCHC: 32 g/dL (ref 30.0–36.0)
MCV: 85.8 fL (ref 80.0–100.0)
Platelets: 117 10*3/uL — ABNORMAL LOW (ref 150–400)
RBC: 3.86 MIL/uL — ABNORMAL LOW (ref 3.87–5.11)
RDW: 20.7 % — ABNORMAL HIGH (ref 11.5–15.5)
WBC: 3.7 10*3/uL — ABNORMAL LOW (ref 4.0–10.5)
nRBC: 0 % (ref 0.0–0.2)

## 2021-12-21 LAB — TROPONIN I (HIGH SENSITIVITY)
Troponin I (High Sensitivity): 123 ng/L (ref <18)
Troponin I (High Sensitivity): 117 ng/L (ref <18)

## 2021-12-21 LAB — BASIC METABOLIC PANEL
Anion gap: 15 (ref 5–15)
BUN: 41 mg/dL — ABNORMAL HIGH (ref 8–23)
CO2: 28 mmol/L (ref 22–32)
Calcium: 8.5 mg/dL — ABNORMAL LOW (ref 8.9–10.3)
Chloride: 94 mmol/L — ABNORMAL LOW (ref 98–111)
Creatinine, Ser: 7.96 mg/dL — ABNORMAL HIGH (ref 0.44–1.00)
GFR, Estimated: 5 mL/min — ABNORMAL LOW (ref >60)
Glucose, Bld: 131 mg/dL — ABNORMAL HIGH (ref 70–99)
Potassium: 3.5 mmol/L (ref 3.5–5.1)
Sodium: 137 mmol/L (ref 135–145)

## 2021-12-21 NOTE — ED Notes (Addendum)
Charge nurse notified on patient's elevated Troponin level to expedite room assignment for patient ( she is at waiting area). EDP ( Dr. Roxanne Mins) notified .

## 2021-12-21 NOTE — Discharge Instructions (Signed)
Go for your dialysis today.  Work with your dialysis doctor to decide whether you should have a little bit more fluid drawn off each session.  Return if you are having any problems.

## 2021-12-21 NOTE — ED Notes (Signed)
Patient verbalizes understanding of d/c instructions. Opportunities for questions and answers were provided. Pt d/c from ED and wheeled to lobby where husband is picking pt up.

## 2021-12-21 NOTE — ED Provider Notes (Signed)
[facility] EMERGENCY DEPARTMENT Provider Note   CSN: [id number] Arrival date & time: 12/20/21  2209     History  Chief Complaint  Patient presents with   Shortness of Breath    Jody Nguyen is a 66 y.o. female.  The history is provided by the patient.  Shortness of Breath She has history of hypertension, diabetes, hyperlipidemia, end-stage renal disease on hemodialysis, chronic diastolic heart failure, pulmonary hypertension and she comes in because of shortness of breath which started about 8 PM.  He frequently has shortness of breath, usually when she is coming up on due for her dialysis.  Sometimes she has to be put on oxygen when she gets into dialysis, does not have oxygen at home.  She denies chest pain, heaviness, tightness, pressure.  She denies any fever, chills, cough.  She feels better since arriving in the ED and being placed on oxygen.   Home Medications Prior to Admission medications   Medication Sig Start Date End Date Taking? Authorizing Provider  acetaminophen (TYLENOL) 500 MG tablet Take 500 mg by mouth as needed (for pain.).    [provider]  apixaban (ELIQUIS) 5 MG TABS tablet Take 5 mg by mouth 2 (two) times daily.    [provider]  aspirin EC 81 MG tablet Take 81 mg by mouth daily.    [provider]  azelastine (ASTELIN) 0.1 % nasal spray Use 1 to 2 sprays each nostril 2 to 3 x / day 03/26/18   Unk Pinto, MD  carvedilol (COREG) 25 MG tablet Take 25 mg by mouth daily. Patient not taking: Reported on 12/08/2021    [provider]  cinacalcet (SENSIPAR) 60 MG tablet Take 1 tablet (60 mg total) by mouth daily. Patient taking differently: Take 60 mg by mouth See admin instructions. Takes at dialysis M/W/F 09/11/17   Unk Pinto, MD  hydrALAZINE (APRESOLINE) 50 MG tablet Take 1 tablet (50 mg total) by mouth 3 (three) times daily. FOR BLOOD  PRESSURE AND HEART 11/09/20   Lelon Perla, MD   latanoprost (XALATAN) 0.005 % ophthalmic solution Place 1 drop into both eyes at bedtime. 09/16/18   [provider]  losartan (COZAAR) 50 MG tablet Take 1 tablet Daily for BP Patient taking differently: Take 50 mg by mouth daily. 03/19/20   Unk Pinto, MD  macitentan (OPSUMIT) 10 MG tablet Take 10 mg by mouth daily.    [provider]  Multiple Vitamins-Minerals (ZINC PO) Take 1 tablet by mouth daily.    [provider]  ondansetron (ZOFRAN ODT) 4 MG disintegrating tablet Take 1 tablet (4 mg total) by mouth every 8 (eight) hours as needed for nausea or vomiting. 02/23/19   Nils Flack, Mina A, PA-C  pantoprazole (PROTONIX) 40 MG tablet Take 1 tablet (40 mg total) by mouth daily. 08/29/21 11/24/21  Barb Merino, MD  RENVELA 800 MG tablet Take 1,600 mg by mouth See admin instructions. 1600 mg (2 caps) with every meal 12/21/15   [provider]  rosuvastatin (CRESTOR) 20 MG tablet Take 1 tablet Daily for Cholesterol Patient taking differently: Take 20 mg by mouth at bedtime. 03/19/20   Unk Pinto, MD  sertraline (ZOLOFT) 25 MG tablet Take 1 tablet Daily for Mood Patient taking differently: Take 25 mg by mouth daily. 03/19/20   Unk Pinto, MD  sildenafil (REVATIO) 20 MG tablet Take 2 tablets (40 mg) 3 x  /day fr Pulmonary Hypertension Patient taking differently: Take 40 mg by mouth  3 (three) times daily. 03/26/20   Unk Pinto, MD      Allergies    Minoxidil, Lipitor [atorvastatin], Morphine and related, and Ace inhibitors    Review of Systems   Review of Systems  Respiratory:  Positive for shortness of breath.   All other systems reviewed and are negative.  Physical Exam Updated Vital Signs BP (!) 156/96    Pulse (!) 107    Temp 98.6 F (37 C) (Oral)    Resp (!) 22    Ht 4' 11.5" (1.511 m)    Wt 81 kg    SpO2 97%    BMI 35.46 kg/m  Physical Exam Vitals and nursing note reviewed.  65 year old female, resting comfortably and in no acute  distress. Vital signs are significant for slightly elevated heart rate. Oxygen saturation is 97%, which is normal. Head is normocephalic and atraumatic. PERRLA, EOMI. Oropharynx is clear. Neck is nontender and supple without adenopathy or JVD. Back is nontender and there is no CVA tenderness. Lungs are clear without rales, wheezes, or rhonchi. Chest is nontender. Heart has regular rate and rhythm without murmur. Abdomen is soft, flat, nontender Extremities have no cyanosis or edema, full range of motion is present.  AV fistula is present in the right arm with thrill present. Skin is warm and dry without rash. Neurologic: Mental status is normal, cranial nerves are intact, moves all extremities equally.  ED Results / Procedures / Treatments   Labs (all labs ordered are listed, but only abnormal results are displayed) Labs Reviewed  BRAIN NATRIURETIC PEPTIDE - Abnormal; Notable for the following components:      Result Value   B Natriuretic Peptide 1,753.1 (*)    All other components within normal limits  BASIC METABOLIC PANEL - Abnormal; Notable for the following components:   Chloride 94 (*)    Glucose, Bld 131 (*)    BUN 41 (*)    Creatinine, Ser 7.96 (*)    Calcium 8.5 (*)    GFR, Estimated 5 (*)    All other components within normal limits  CBC - Abnormal; Notable for the following components:   WBC 3.7 (*)    RBC 3.86 (*)    Hemoglobin 10.6 (*)    HCT 33.1 (*)    RDW 20.7 (*)    Platelets 117 (*)    All other components within normal limits  TROPONIN I (HIGH SENSITIVITY) - Abnormal; Notable for the following components:   Troponin I (High Sensitivity) 123 (*)    All other components within normal limits  TROPONIN I (HIGH SENSITIVITY) - Abnormal; Notable for the following components:   Troponin I (High Sensitivity) 117 (*)    All other components within normal limits  RESP PANEL BY RT-PCR (FLU A&B, COVID) [id number]    EKG EKG Interpretation  Date/Time:  Tuesday December 20 2021 22:49:34 EST Ventricular Rate:  97 PR Interval:  200 QRS Duration: 102 QT Interval:  380 QTC Calculation: 482 R Axis:   139 Text Interpretation: Sinus rhythm with occasional Premature ventricular complexes Right axis deviation Anterior infarct , age undetermined Abnormal ECG When compared with ECG of 28-Nov-2021 15:31, Premature ventricular complexes are now present Confirmed by Delora Fuel ([id number]) on 12/21/2021 3:34:13 AM  Radiology DG Chest 2 View  Result Date: 12/20/2021 CLINICAL DATA:  Chest pain, dyspnea EXAM: CHEST - 2 VIEW COMPARISON:  11/28/2021 FINDINGS: The lungs are symmetrically well expanded and are clear. No pneumothorax or pleural effusion. Coronary artery  bypass grafting and aortic valve replacement have been performed. Mild cardiomegaly is stable. Pulmonary vascularity is normal. No acute bone abnormality. IMPRESSION: Stable cardiomegaly. Electronically Signed   By: Fidela Salisbury M.D.   On: 12/20/2021 23:08    Procedures Procedures    Medications Ordered in ED Medications - No data to display  ED Course/ Medical Decision Making/ A&P                           Medical Decision Making  Shortness of breath and dialysis patient.  Timing of dyspnea near when she is due for dialysis suggests fluid overload although she shows no peripheral edema.  Chest x-ray shows cardiomegaly which is unchanged from prior.  I have independently reviewed the images, and agree with radiologist's interpretation.  Labs show mild to moderate troponin elevation, but less than it had been at previous ED visits.  Repeat troponin is downtrending, no evidence of ACS.  Anemia of renal failure is present, actually improved compared with baseline.  Mild thrombocytopenia is present of uncertain cause, but not clinically significant.  BNP is markedly elevated, but this is at least partly secondary to her renal failure.  I continue to feel that her dyspnea is related to fluid overload even though there  are no obvious physical findings to suggest that.  She is scheduled for dialysis at 5:30 AM, she is discharged with instructions to go to her dialysis session and work with her nephrologist to find an appropriate dry weight for her.  Return precautions discussed.        Final Clinical Impression(s) / ED Diagnoses Final diagnoses:  SOB (shortness of breath)  End-stage renal disease on hemodialysis ([place])  Anemia associated with chronic renal failure  Thrombocytopenia ([place])    Rx / DC Orders ED Discharge Orders     None         Delora Fuel, MD 72/90/21 0403

## 2021-12-23 DIAGNOSIS — D509 Iron deficiency anemia, unspecified: Secondary | ICD-10-CM | POA: Diagnosis not present

## 2021-12-23 DIAGNOSIS — N2581 Secondary hyperparathyroidism of renal origin: Secondary | ICD-10-CM | POA: Diagnosis not present

## 2021-12-23 DIAGNOSIS — Z992 Dependence on renal dialysis: Secondary | ICD-10-CM | POA: Diagnosis not present

## 2021-12-23 DIAGNOSIS — N186 End stage renal disease: Secondary | ICD-10-CM | POA: Diagnosis not present

## 2021-12-23 DIAGNOSIS — D631 Anemia in chronic kidney disease: Secondary | ICD-10-CM | POA: Diagnosis not present

## 2021-12-23 DIAGNOSIS — D689 Coagulation defect, unspecified: Secondary | ICD-10-CM | POA: Diagnosis not present

## 2021-12-23 DIAGNOSIS — E118 Type 2 diabetes mellitus with unspecified complications: Secondary | ICD-10-CM | POA: Diagnosis not present

## 2021-12-26 DIAGNOSIS — D631 Anemia in chronic kidney disease: Secondary | ICD-10-CM | POA: Diagnosis not present

## 2021-12-26 DIAGNOSIS — N186 End stage renal disease: Secondary | ICD-10-CM | POA: Diagnosis not present

## 2021-12-26 DIAGNOSIS — D509 Iron deficiency anemia, unspecified: Secondary | ICD-10-CM | POA: Diagnosis not present

## 2021-12-26 DIAGNOSIS — D689 Coagulation defect, unspecified: Secondary | ICD-10-CM | POA: Diagnosis not present

## 2021-12-26 DIAGNOSIS — N2581 Secondary hyperparathyroidism of renal origin: Secondary | ICD-10-CM | POA: Diagnosis not present

## 2021-12-26 DIAGNOSIS — Z992 Dependence on renal dialysis: Secondary | ICD-10-CM | POA: Diagnosis not present

## 2021-12-26 DIAGNOSIS — E118 Type 2 diabetes mellitus with unspecified complications: Secondary | ICD-10-CM | POA: Diagnosis not present

## 2021-12-27 DIAGNOSIS — N186 End stage renal disease: Secondary | ICD-10-CM | POA: Diagnosis not present

## 2021-12-27 DIAGNOSIS — Z992 Dependence on renal dialysis: Secondary | ICD-10-CM | POA: Diagnosis not present

## 2021-12-27 DIAGNOSIS — E1129 Type 2 diabetes mellitus with other diabetic kidney complication: Secondary | ICD-10-CM | POA: Diagnosis not present

## 2021-12-28 ENCOUNTER — Encounter (HOSPITAL_BASED_OUTPATIENT_CLINIC_OR_DEPARTMENT_OTHER): Payer: Self-pay | Admitting: *Deleted

## 2021-12-28 ENCOUNTER — Other Ambulatory Visit: Payer: Self-pay

## 2021-12-28 ENCOUNTER — Other Ambulatory Visit: Payer: Self-pay | Admitting: Internal Medicine

## 2021-12-28 ENCOUNTER — Emergency Department (HOSPITAL_BASED_OUTPATIENT_CLINIC_OR_DEPARTMENT_OTHER)
Admission: EM | Admit: 2021-12-28 | Discharge: 2021-12-28 | Disposition: A | Discharge status: Home / Self Care | Payer: Medicare Other | Attending: Emergency Medicine | Admitting: Emergency Medicine

## 2021-12-28 ENCOUNTER — Emergency Department (HOSPITAL_BASED_OUTPATIENT_CLINIC_OR_DEPARTMENT_OTHER): Payer: Medicare Other | Admitting: Radiology

## 2021-12-28 ENCOUNTER — Emergency Department (HOSPITAL_BASED_OUTPATIENT_CLINIC_OR_DEPARTMENT_OTHER): Payer: Medicare Other

## 2021-12-28 DIAGNOSIS — D509 Iron deficiency anemia, unspecified: Secondary | ICD-10-CM | POA: Diagnosis not present

## 2021-12-28 DIAGNOSIS — N186 End stage renal disease: Secondary | ICD-10-CM | POA: Diagnosis not present

## 2021-12-28 DIAGNOSIS — Z79899 Other long term (current) drug therapy: Secondary | ICD-10-CM | POA: Diagnosis not present

## 2021-12-28 DIAGNOSIS — Z7982 Long term (current) use of aspirin: Secondary | ICD-10-CM | POA: Diagnosis not present

## 2021-12-28 DIAGNOSIS — Z20822 Contact with and (suspected) exposure to covid-19: Secondary | ICD-10-CM | POA: Diagnosis not present

## 2021-12-28 DIAGNOSIS — R079 Chest pain, unspecified: Secondary | ICD-10-CM | POA: Diagnosis not present

## 2021-12-28 DIAGNOSIS — N2581 Secondary hyperparathyroidism of renal origin: Secondary | ICD-10-CM | POA: Diagnosis not present

## 2021-12-28 DIAGNOSIS — R0789 Other chest pain: Secondary | ICD-10-CM | POA: Diagnosis not present

## 2021-12-28 DIAGNOSIS — Z992 Dependence on renal dialysis: Secondary | ICD-10-CM | POA: Diagnosis not present

## 2021-12-28 DIAGNOSIS — D689 Coagulation defect, unspecified: Secondary | ICD-10-CM | POA: Diagnosis not present

## 2021-12-28 DIAGNOSIS — I517 Cardiomegaly: Secondary | ICD-10-CM | POA: Diagnosis not present

## 2021-12-28 LAB — BASIC METABOLIC PANEL
Anion gap: 12 (ref 5–15)
BUN: 15 mg/dL (ref 8–23)
CO2: 33 mmol/L — ABNORMAL HIGH (ref 22–32)
Calcium: 9.4 mg/dL (ref 8.9–10.3)
Chloride: 93 mmol/L — ABNORMAL LOW (ref 98–111)
Creatinine, Ser: 4.54 mg/dL — ABNORMAL HIGH (ref 0.44–1.00)
GFR, Estimated: 10 mL/min — ABNORMAL LOW (ref >60)
Glucose, Bld: 96 mg/dL (ref 70–99)
Potassium: 3 mmol/L — ABNORMAL LOW (ref 3.5–5.1)
Sodium: 138 mmol/L (ref 135–145)

## 2021-12-28 LAB — RESP PANEL BY RT-PCR (FLU A&B, COVID) ARPGX2
Influenza A by PCR: NEGATIVE
Influenza B by PCR: NEGATIVE
SARS Coronavirus 2 by RT PCR: NEGATIVE

## 2021-12-28 LAB — TROPONIN I (HIGH SENSITIVITY)
Troponin I (High Sensitivity): 105 ng/L (ref <18)
Troponin I (High Sensitivity): 102 ng/L (ref <18)

## 2021-12-28 LAB — CBC
HCT: 35.6 % — ABNORMAL LOW (ref 36.0–46.0)
Hemoglobin: 11.2 g/dL — ABNORMAL LOW (ref 12.0–15.0)
MCH: 26.9 pg (ref 26.0–34.0)
MCHC: 31.5 g/dL (ref 30.0–36.0)
MCV: 85.4 fL (ref 80.0–100.0)
Platelets: 191 10*3/uL (ref 150–400)
RBC: 4.17 MIL/uL (ref 3.87–5.11)
RDW: 21.3 % — ABNORMAL HIGH (ref 11.5–15.5)
WBC: 4.1 10*3/uL (ref 4.0–10.5)
nRBC: 1.2 % — ABNORMAL HIGH (ref 0.0–0.2)

## 2021-12-28 LAB — LACTIC ACID, PLASMA: Lactic Acid, Venous: 1.5 mmol/L (ref 0.5–1.9)

## 2021-12-28 MED ORDER — ACETAMINOPHEN 325 MG PO TABS
650.0000 mg | ORAL_TABLET | Freq: Once | ORAL | Status: AC
  Administered 2021-12-28: 650 mg via ORAL
  Filled 2021-12-28: qty 2

## 2021-12-28 MED ORDER — SODIUM CHLORIDE 0.9 % IV BOLUS
500.0000 mL | Freq: Once | INTRAVENOUS | Status: AC
  Administered 2021-12-28: 500 mL via INTRAVENOUS

## 2021-12-28 NOTE — ED Triage Notes (Signed)
Pt arrives here after episode of CP and headache after HD.  Pt completed her dialysis treatment this am and reports that she had CP that radiated to her back as well as a headache.  Pt repots that some sob with this but no nausea.  Pt states that the CP has resolved but there is still some pain in her back.

## 2021-12-28 NOTE — ED Notes (Signed)
Patient states pain is gone.  Denies chest pain and headache is gone.

## 2021-12-28 NOTE — ED Notes (Signed)
CRITICAL VALUE STICKER  CRITICAL VALUE:Troponin 105  RECEIVER (on-site recipient of call):Shawnie Pons, RN  DATE & TIME NOTIFIED: 12-28-2021 1155  MESSENGER (representative from lab):  MD NOTIFIED: Dr. Almyra Free  TIME OF NOTIFICATION:1155  RESPONSE:

## 2021-12-28 NOTE — Discharge Instructions (Addendum)
Call your primary care doctor or specialist as discussed in the next 2-3 days.   Return immediately back to the ER if:  Your symptoms worsen within the next 12-24 hours. You develop new symptoms such as new fevers, persistent vomiting, new pain, shortness of breath, or new weakness or numbness, or if you have any other concerns.  

## 2021-12-28 NOTE — ED Notes (Signed)
Jody Nguyen top redrawn and sent.

## 2021-12-28 NOTE — ED Provider Notes (Signed)
[facility] EMERGENCY DEPT Provider Note   CSN: [id number] Arrival date & time: 12/28/21  0946     History  Chief Complaint  Patient presents with   Chest Pain    Jody Nguyen is a 66 y.o. female.  Patient presents to ER chief complaint of chest pain rating to her back.  Describes as a pressure-like sensation.  She had just finished dialysis today when she states the symptoms onset.  The lasted about an hour or 2 and have subsequently resolved.  Denies any pain or discomfort at this time.  She also had a headache at the time which is now gone.  No reports of fevers or cough no reports of vomiting or diarrhea.  Patient does not make urine and is dialysis dependent.      Home Medications Prior to Admission medications   Medication Sig Start Date End Date Taking? Authorizing Provider  acetaminophen (TYLENOL) 500 MG tablet Take 500 mg by mouth as needed (for pain.).   Yes [provider]  apixaban (ELIQUIS) 5 MG TABS tablet Take 5 mg by mouth 2 (two) times daily.   Yes [provider]  aspirin EC 81 MG tablet Take 81 mg by mouth daily.   Yes [provider]  carvedilol (COREG) 25 MG tablet Take 25 mg by mouth daily.   Yes [provider]  cinacalcet (SENSIPAR) 60 MG tablet Take 1 tablet (60 mg total) by mouth daily. Patient taking differently: Take 60 mg by mouth See admin instructions. Takes at dialysis M/W/F 09/11/17  Yes Unk Pinto, MD  hydrALAZINE (APRESOLINE) 50 MG tablet Take 1 tablet (50 mg total) by mouth 3 (three) times daily. FOR BLOOD  PRESSURE AND HEART 11/09/20  Yes Lelon Perla, MD  latanoprost (XALATAN) 0.005 % ophthalmic solution Place 1 drop into both eyes at bedtime. 09/16/18  Yes [provider]  losartan (COZAAR) 50 MG tablet Take 1 tablet Daily for BP Patient taking differently: Take 50 mg by mouth daily. 03/19/20  Yes Unk Pinto, MD  macitentan (OPSUMIT) 10 MG tablet Take 10 mg by mouth  daily.   Yes [provider]  RENVELA 800 MG tablet Take 1,600 mg by mouth See admin instructions. 1600 mg (2 caps) with every meal 12/21/15  Yes [provider]  rosuvastatin (CRESTOR) 20 MG tablet Take 1 tablet Daily for Cholesterol Patient taking differently: Take 20 mg by mouth at bedtime. 03/19/20  Yes Unk Pinto, MD  sertraline (ZOLOFT) 25 MG tablet Take 1 tablet Daily for Mood Patient taking differently: Take 25 mg by mouth daily. 03/19/20  Yes Unk Pinto, MD  sildenafil (REVATIO) 20 MG tablet Take 2 tablets (40 mg) 3 x  /day fr Pulmonary Hypertension Patient taking differently: Take 40 mg by mouth 3 (three) times daily. 03/26/20  Yes Unk Pinto, MD  azelastine (ASTELIN) 0.1 % nasal spray Use 1 to 2 sprays each nostril 2 to 3 x / day 03/26/18   Unk Pinto, MD  ondansetron (ZOFRAN ODT) 4 MG disintegrating tablet Take 1 tablet (4 mg total) by mouth every 8 (eight) hours as needed for nausea or vomiting. Patient not taking: Reported on 12/28/2021 02/23/19   Rodell Perna A, PA-C  pantoprazole (PROTONIX) 40 MG tablet Take 1 tablet (40 mg total) by mouth daily. 08/29/21 11/24/21  Barb Merino, MD      Allergies    Minoxidil, Lipitor [atorvastatin], Morphine and related, and Ace inhibitors    Review of Systems   Review of  Systems  Constitutional:  Negative for fever.  HENT:  Negative for ear pain.   Eyes:  Negative for pain.  Respiratory:  Negative for cough.   Cardiovascular:  Positive for chest pain.  Gastrointestinal:  Negative for abdominal pain.  Genitourinary:  Negative for flank pain.  Musculoskeletal:  Negative for back pain.  Skin:  Negative for rash.  Neurological:  Negative for headaches.   Physical Exam Updated Vital Signs BP 102/71    Pulse 94    Temp 97.9 F (36.6 C) (Oral)    Resp (!) 22    Wt 76 kg    SpO2 94%    BMI 33.27 kg/m  Physical Exam Constitutional:      General: She is not in acute distress.    Appearance: Normal  appearance.  HENT:     Head: Normocephalic.     Nose: Nose normal.  Eyes:     Extraocular Movements: Extraocular movements intact.  Cardiovascular:     Rate and Rhythm: Normal rate.  Pulmonary:     Effort: Pulmonary effort is normal.  Musculoskeletal:        General: Normal range of motion.     Cervical back: Normal range of motion.  Neurological:     General: No focal deficit present.     Mental Status: She is alert. Mental status is at baseline.    ED Results / Procedures / Treatments   Labs (all labs ordered are listed, but only abnormal results are displayed) Labs Reviewed  CBC - Abnormal; Notable for the following components:      Result Value   Hemoglobin 11.2 (*)    HCT 35.6 (*)    RDW 21.3 (*)    nRBC 1.2 (*)    All other components within normal limits  BASIC METABOLIC PANEL - Abnormal; Notable for the following components:   Potassium 3.0 (*)    Chloride 93 (*)    CO2 33 (*)    Creatinine, Ser 4.54 (*)    GFR, Estimated 10 (*)    All other components within normal limits  TROPONIN I (HIGH SENSITIVITY) - Abnormal; Notable for the following components:   Troponin I (High Sensitivity) 105 (*)    All other components within normal limits  TROPONIN I (HIGH SENSITIVITY) - Abnormal; Notable for the following components:   Troponin I (High Sensitivity) 102 (*)    All other components within normal limits  RESP PANEL BY RT-PCR (FLU A&B, COVID) ARPGX2  CULTURE, BLOOD (ROUTINE X 2)  CULTURE, BLOOD (ROUTINE X 2)  LACTIC ACID, PLASMA    EKG EKG Interpretation  Date/Time:  Wednesday December 28 2021 10:04:11 EST Ventricular Rate:  97 PR Interval:  203 QRS Duration: 116 QT Interval:  392 QTC Calculation: 498 R Axis:   117 Text Interpretation: Sinus rhythm Nonspecific intraventricular conduction delay Nonspecific repol abnormality, lateral leads Confirmed by Thamas Jaegers ([id number]) on 12/28/2021 11:46:58 AM  Radiology DG Chest Port 1 View  Result Date:  12/28/2021 CLINICAL DATA:  Provided history: Chest pain.  History of CHF. EXAM: PORTABLE CHEST 1 VIEW COMPARISON:  Prior chest radiographs 12/20/2021 and earlier. FINDINGS: Unchanged cardiomegaly. Prosthetic cardiac valve. Aortic atherosclerosis. No appreciable airspace consolidation or pulmonary edema. No evidence of pleural effusion or pneumothorax. No acute bony abnormality identified. IMPRESSION: No evidence of acute cardiopulmonary abnormality. Unchanged cardiomegaly. Aortic Atherosclerosis (ICD10-I70.0). Electronically Signed   By: Kellie Simmering D.O.   On: 12/28/2021 11:09    Procedures Procedures    Medications Ordered  in ED Medications  sodium chloride 0.9 % bolus 500 mL (0 mLs Intravenous Stopped 12/28/21 1233)  acetaminophen (TYLENOL) tablet 650 mg (650 mg Oral Given 12/28/21 1232)    ED Course/ Medical Decision Making/ A&P                           Medical Decision Making Amount and/or Complexity of Data Reviewed Labs: ordered. Radiology: ordered.  Risk OTC drugs.   Chart review shows office visit with her nephrology team January 72,023.  Vital signs today show slightly low blood pressure otherwise within normal limits.  She had episode of hypotension that appears to have resolved spontaneously.  She was given 500 cc bolus of fluids afterwards.  Labs otherwise are negative troponin elevated 100 but appears similar to prior elevated troponin levels in the past.  Repeat troponin remains flat.  Patient states her symptoms have resolved and she has no chest pain or discomfort at this time.  EKG shows sinus rhythm no ST elevations or depressions noted.  Given the patient is now asymptomatic, I feel she stable for continued outpatient care.  Advised her to call her primary care doctor or cardiologist within the next 1 or 2 days to schedule appointment.  Advised immediate return for worsening symptoms fevers pain or any additional concerns.         Final Clinical  Impression(s) / ED Diagnoses Final diagnoses:  Chest pain, unspecified type    Rx / DC Orders ED Discharge Orders     None         Luna Fuse, MD 12/28/21 1402

## 2021-12-29 ENCOUNTER — Encounter (HOSPITAL_BASED_OUTPATIENT_CLINIC_OR_DEPARTMENT_OTHER): Payer: Self-pay | Admitting: Family

## 2021-12-29 ENCOUNTER — Ambulatory Visit (INDEPENDENT_AMBULATORY_CARE_PROVIDER_SITE_OTHER): Payer: Medicare Other | Admitting: Family

## 2021-12-29 VITALS — BP 110/70 | HR 55 | Ht 59.5 in | Wt 169.8 lb

## 2021-12-29 DIAGNOSIS — I25118 Atherosclerotic heart disease of native coronary artery with other forms of angina pectoris: Secondary | ICD-10-CM

## 2021-12-29 DIAGNOSIS — I272 Pulmonary hypertension, unspecified: Secondary | ICD-10-CM

## 2021-12-29 DIAGNOSIS — I5032 Chronic diastolic (congestive) heart failure: Secondary | ICD-10-CM | POA: Diagnosis not present

## 2021-12-29 DIAGNOSIS — N186 End stage renal disease: Secondary | ICD-10-CM | POA: Diagnosis not present

## 2021-12-29 DIAGNOSIS — R5381 Other malaise: Secondary | ICD-10-CM

## 2021-12-29 DIAGNOSIS — Z9889 Other specified postprocedural states: Secondary | ICD-10-CM

## 2021-12-29 NOTE — Patient Instructions (Addendum)
Medication Instructions:  Your Physician recommend you continue on your current medication as directed.    *If you need a refill on your cardiac medications before your next appointment, please call your pharmacy*   Lab Work: None ordered today   Testing/Procedures: None ordered today    Follow-Up: At [facility], you and your health needs are our priority.  As part of our continuing mission to provide you with exceptional heart care, we have created designated Provider Care Teams.  These Care Teams include your primary Cardiologist (physician) and Advanced Practice Providers (APPs -  Physician Assistants and Nurse Practitioners) who all work together to provide you with the care you need, when you need it.  We recommend signing up for the patient portal called "MyChart".  Sign up information is provided on this After Visit Summary.  MyChart is used to connect with patients for Virtual Visits (Telemedicine).  Patients are able to view lab/test results, encounter notes, upcoming appointments, etc.  Non-urgent messages can be sent to your provider as well.   To learn more about what you can do with MyChart, go to [email or web address].    Other Instructions Loel Dubonnet, NP will reach out to Dr. Stanford Breed about rescheduling your appointment in April or May.   We have referred you to Physical Therapy. They will be contacting you to schedule.

## 2021-12-29 NOTE — Progress Notes (Signed)
Office Visit    Patient Name: Jody Nguyen Date of Encounter: 12/29/2021  PCP:  Unk Pinto, [facility]  Cardiologist:  [name], MD  Advanced Practice Provider:  No care team member to display Electrophysiologist:  None      Chief Complaint    Jody Nguyen is a 66 y.o. female with a hx of pulmonary hypertension, diastolic heart failure, previous MV repair, nonobstructive coronary disease, HTN, HLD, ESRD   presents today for follow-up after ED visit for chest pain.  Past Medical History    Past Medical History:  Diagnosis Date   Anemia    Chronic diastolic CHF (congestive heart failure) (HCC)    Chronic diastolic heart failure (HCC)    CKD (chronic kidney disease) stage 4, GFR 15-29 ml/min (HCC)    dialysis M/W/F   Constipation    CVA (cerebral infarction) 1997   no residual deficit   Diverticulitis    DM (diabetes mellitus) (HCC)    type 2   Heart murmur    History of cardiovascular stress test    a. Myoview Oct 2012 showed EF 49%, no ischemia, LVE   HLD (hyperlipidemia)    takes Crestor daily   Hypertension    Pericardial effusion    chronic; felt to be poss related to minoxidil >> DC'd   Pulmonary hypertension ([place]) 06/18/2015   Respiratory failure, acute hypoxic, post-operative 07/08/2015   Requiring ECMO support   S/P cardiac catheterization    a. R/L [facility] 06/18/15:  mLAD 30%; severe pulmo HTN with PA sat 43%, CI 1.86, prominent V waves indicative of MR; resting hypoxemia O2 sat 86% on RA   S/P minimally invasive mitral valve repair 07/08/2015   Complex valvuloplasty including artificial Gore-tex neochord placement x10 and 26 mm Sorin Memo 3D Rechord ring annuloplasty via right mini thoracotomy approach   Severe mitral regurgitation    Shortness of breath dyspnea    Stroke ([place]) 1997   no residual effect   Vitamin D deficiency    Wears glasses    Past Surgical History:  Procedure Laterality Date   A/V  FISTULAGRAM Right 08/16/2017   Procedure: A/V Fistulagram;  Surgeon: Conrad Clay Center, MD;  Location: [place] [facility];  Service: Cardiovascular;  Laterality: Right;   AV FISTULA PLACEMENT Left 10/22/2015   Procedure: RADIOCEPHALIC ARTERIOVENOUS (AV) FISTULA CREATION;  Surgeon: Serafina Mitchell, MD;  Location: [place] [facility];  Service: Vascular;  Laterality: Left;   AV FISTULA PLACEMENT Right 05/22/2017   Procedure: ARTERIOVENOUS (AV) FISTULA CREATION-RIGHT ARM;  Surgeon: Waynetta Sandy, MD;  Location: [place];  Service: Vascular;  Laterality: Right;   CANNULATION FOR CARDIOPULMONARY BYPASS N/A 07/08/2015   Procedure: CANNULATION FOR ECMO;  Surgeon: Rexene Alberts, MD;  Location: [place];  Service: Open Heart Surgery;  Laterality: N/A;   CARDIAC CATHETERIZATION     CARDIAC CATHETERIZATION N/A 06/18/2015   Procedure: Right/Left Heart Cath and Coronary Angiography;  Surgeon: Jettie Booze, MD;  Location: [place] [facility];  Service: Cardiovascular;  Laterality: N/A;   CESAREAN SECTION     x 2   COLONOSCOPY     EXCHANGE OF A DIALYSIS CATHETER N/A 06/28/2017   Procedure: EXCHANGE OF A DIALYSIS CATHETER;  Surgeon: Waynetta Sandy, MD;  Location: [place];  Service: Vascular;  Laterality: N/A;   FISTULA SUPERFICIALIZATION Left 12/28/2015   Procedure: SUPERFICIALIZATION LEFT RADIOCEPHALIC FISTULA;  Surgeon: Serafina Mitchell, MD;  Location: [place];  Service: Vascular;  Laterality: Left;   FISTULA SUPERFICIALIZATION Right 08/21/2017   Procedure: FISTULA SUPERFICIALIZATION RIGHT ARM;  Surgeon: Waynetta Sandy, MD;  Location: [place];  Service: Vascular;  Laterality: Right;   INSERTION OF DIALYSIS CATHETER Right 04/28/2017   Procedure: INSERTION OF DIALYSIS CATHETER-RIGHT INTERNAL JUGULAR PLACEMENT;  Surgeon: Elam Dutch, MD;  Location: [place];  Service: Vascular;  Laterality: Right;   LIGATION OF ARTERIOVENOUS  FISTULA Left 04/28/2017   Procedure: LIGATION OF ARTERIOVENOUS  FISTULA;  Surgeon:  Elam Dutch, MD;  Location: [place];  Service: Vascular;  Laterality: Left;   MITRAL VALVE REPAIR Right 07/08/2015   Procedure: MINIMALLY INVASIVE MITRAL VALVE REPAIR with a 26 Sorin Memo 3D Rechord;  Surgeon: Rexene Alberts, MD;  Location: [place];  Service: Open Heart Surgery;  Laterality: Right;   MULTIPLE EXTRACTIONS WITH ALVEOLOPLASTY N/A 06/30/2015   Procedure: MULTIPLE EXTRACTIONS OF TOOTH #'S 4  AND 30 WITH ALVEOLOPLASTY AND GROSS DEBRIDEMENT  OF REMAINING TEETH;  Surgeon: Lenn Cal, DDS;  Location: [place];  Service: Oral Surgery;  Laterality: N/A;   PERIPHERAL VASCULAR CATHETERIZATION Left 03/28/2016   Procedure: A/V Fistulagram;  Surgeon: Serafina Mitchell, MD;  Location: [place] [facility];  Service: Cardiovascular;  Laterality: Left;  lower arm   TEE WITHOUT CARDIOVERSION N/A 06/08/2015   Procedure: TRANSESOPHAGEAL ECHOCARDIOGRAM (TEE);  Surgeon: Lelon Perla, MD;  Location: [facility];  Service: Cardiovascular;  Laterality: N/A;   TEE WITHOUT CARDIOVERSION N/A 07/08/2015   Procedure: TRANSESOPHAGEAL ECHOCARDIOGRAM (TEE);  Surgeon: Rexene Alberts, MD;  Location: [place];  Service: Open Heart Surgery;  Laterality: N/A;    Allergies  Allergies  Allergen Reactions   Minoxidil Other (See Comments)    Pericardial effusion   Lipitor [Atorvastatin] Other (See Comments)    MYALGIAS > "pain in legs" Tolerates rosuvastatin   Morphine And Related Itching   Ace Inhibitors Other (See Comments)    REACTION: "not sure...think it made me drowsy all the time"    History of Present Illness    Jody Nguyen is a 66 y.o. female with a hx of pulmonary hypertension, diastolic heart failure, previous MV repair, nonobstructive coronary disease, HTN, HLD, ESRD  last seen 11-Oct-2021 by Dr. Stanford Breed.  Cardiac cath 2016 with nonobstructive coronary disease.   She was seen yesterday in the ED due to chest pain.  Troponins were elevated at 100 but flat and consistent with previous.  She was  slightly hypotensive and given fluid bolus.  EKG with no acute changes.  Presents today for follow up with her granddaughter. Tells me the midsternal chest pain woke her up from sleep at dialysis. They cut her fluids back and sat her up. She felt slightly short of breath. Also had headache which improved with Tylenol. BP at HD recently 140/75 when standing, sitting 115-120/70s.  Since seeing Dr. Stanford Breed has noted no chest pain other than ED visit but does note shortness of breath at rest or with activity that occurs once per week. She denies orthopnea but does have PND. Sleeps on 3 pillows but shares with me they are flat and she is not at a significant incline.  Notes quiet snoring reported by family, wakes up feeling well rested. Previous sleep study about 3 years ago with no OSA. Notes fatigue and not able to complete as much activity as she would like, interested in [facility] PT.   EKGs/Labs/Other Studies Reviewed:   The following studies were reviewed today: [place] 2021-10-11  at [facility]  Findings  Right Atrium is normal.  Right Ventricle is mildly to moderately elevated.  Pulmonary Artery is mildly to moderately elevated.  Pulmonary capillary wedge pressure is mildly elevated.  There is moderate Pulmonary Hypertension.  Mixed Venous Saturation is normal .  Cardiac Output is mildly reduced by thermodilution.  Cardiac Output is normal  by Fick.   There is no Systemic Hypertension.   Procedural Statistics   Pressures   RA 8 mmHg/6 mmHg/6 mmHg  RV 52 mmHg/3 mmHg/8 mmHg  PA 50 mmHg/23 mmHg/32 mmHg  PCW 25 mmHg/18 mmHg/21 mmHg  AO 110 mmHg/71 mmHg/84 mmHg  BP 125/79    Saturations   SVC Saturation O2-Sat 69  PA Saturation O2-Sat 68  AO Saturation O2-Sat 99    Estimated Blood Loss: 2 ml   Mean Arterial Pressure (MAP): 84 mm Hg  Cardiac Output (CO) Fick:  5.8 l/min  Cardiac Index (CI) Fick:  3.2 l/min/m2  Cardiac Output (CO) Thermodilution:  3.97 l/min  Cardiac Index (CI)  Thermodilution:  2.33 l/min/m2   Transpulmonary Pressure Gradient (TPG) in mmHg:  11 mm Hg  PVR: 1.9 by Fick, 2.7 by TD   Myoview 08/2021  Findings are consistent with prior myocardial infarction. The study is intermediate risk.   No ST deviation was noted.   LV perfusion is abnormal. Defect 1: There is a medium defect with moderate reduction in uptake present in the mid to basal inferior location(s) that is fixed. There is abnormal wall motion in the defect area. Consistent with infarction.   Left ventricular function is abnormal. Global function is moderately reduced. Nuclear stress EF: 42 %. The left ventricular ejection fraction is moderately decreased (30-44%). End diastolic cavity size is normal.   Prior study not available for comparison.     Conclusion:Findings consistent with prior myocardial infarction with no evidence of peri-infarct ischemia. Intermediate risk study due to ejection fraction.  Echo 08/2021  1. Left ventricular ejection fraction, by estimation, is 50 to 55%. The  left ventricle has low normal function. The left ventricle has no regional  wall motion abnormalities. There is moderate left ventricular hypertrophy.  Left ventricular diastolic  parameters are indeterminate.   2. Right ventricular systolic function is mildly reduced. The right  ventricular size is mildly enlarged. Mildly increased right ventricular  wall thickness. There is moderately elevated pulmonary artery systolic  pressure. The estimated right ventricular  systolic pressure is 70.1 mmHg.   3. Left atrial size was mild to moderately dilated.   4. Right atrial size was mildly dilated.   5. The mitral valve has been repaired/replaced. Mild mitral valve  regurgitation. The mean mitral valve gradient is 9.0 mmHg. There is a 26  mm Sorin Memo 3D Rechord present in the mitral position. Suggestive of at  least moderately stenotic mitral valve.   6. Tricuspid valve regurgitation is moderate.   7. The  aortic valve is tricuspid. Aortic valve regurgitation is not  visualized. No aortic stenosis is present. Aortic valve mean gradient  measures 4.0 mmHg.   8. The inferior vena cava is normal in size with <50% respiratory  variability, suggesting right atrial pressure of 8 mmHg.   Comparison(s): Prior images unable to be directly viewed.   EKG: No EKG today  Recent Labs: 04/21/2021: Magnesium 2.5 12/08/2021: ALT 19; TSH 2.67 12/20/2021: B Natriuretic Peptide 1,753.1 12/28/2021: BUN 15; Creatinine, Ser 4.54; Hemoglobin 11.2; Platelets 191; Potassium 3.0; Sodium 138  Recent Lipid Panel  Component Value Date/Time   CHOL 227 (H) 12/08/2021 1110   TRIG 103 12/08/2021 1110   HDL 50 12/08/2021 1110   CHOLHDL 4.5 12/08/2021 1110   VLDL 25 06/07/2017 1006   LDLCALC 155 (H) 12/08/2021 1110   Home Medications   Current Meds  Medication Sig   acetaminophen (TYLENOL) 500 MG tablet Take 500 mg by mouth as needed (for pain.).   apixaban (ELIQUIS) 5 MG TABS tablet Take 5 mg by mouth 2 (two) times daily.   aspirin EC 81 MG tablet Take 81 mg by mouth daily.   azelastine (ASTELIN) 0.1 % nasal spray Use 1 to 2 sprays each nostril 2 to 3 x / day   carvedilol (COREG) 25 MG tablet Take 25 mg by mouth daily.   cinacalcet (SENSIPAR) 60 MG tablet Take 1 tablet (60 mg total) by mouth daily. (Patient taking differently: Take 60 mg by mouth See admin instructions. Takes at dialysis M/W/F)   Doxercalciferol (HECTOROL IV) Doxercalciferol (Hectorol)   Doxercalciferol (HECTOROL IV) Doxercalciferol (Hectorol)   hydrALAZINE (APRESOLINE) 50 MG tablet Take 1 tablet (50 mg total) by mouth 3 (three) times daily. FOR BLOOD  PRESSURE AND HEART   latanoprost (XALATAN) 0.005 % ophthalmic solution Place 1 drop into both eyes at bedtime.   losartan (COZAAR) 50 MG tablet Take 1 tablet Daily for BP (Patient taking differently: Take 50 mg by mouth daily.)   macitentan (OPSUMIT) 10 MG tablet Take 10 mg by mouth daily.   Methoxy  PEG-Epoetin Beta (MIRCERA IJ) Mircera   nitroGLYCERIN (NITROSTAT) 0.4 MG SL tablet Place under the tongue.   ondansetron (ZOFRAN ODT) 4 MG disintegrating tablet Take 1 tablet (4 mg total) by mouth every 8 (eight) hours as needed for nausea or vomiting.   RENVELA 800 MG tablet Take 1,600 mg by mouth See admin instructions. 1600 mg (2 caps) with every meal   rosuvastatin (CRESTOR) 20 MG tablet Take 1 tablet Daily for Cholesterol (Patient taking differently: Take 20 mg by mouth at bedtime.)   sertraline (ZOLOFT) 25 MG tablet Take 1 tablet Daily for Mood (Patient taking differently: Take 25 mg by mouth daily.)   sildenafil (REVATIO) 20 MG tablet Take 2 tablets (40 mg) 3 x  /day fr Pulmonary Hypertension (Patient taking differently: Take 40 mg by mouth 3 (three) times daily.)     Review of Systems      All other systems reviewed and are otherwise negative except as noted above.  Physical Exam    VS:  BP 110/70    Pulse (!) 55    Ht 4' 11.5" (1.511 m)    Wt 169 lb 12.8 oz (77 kg)    SpO2 97%    BMI 33.72 kg/m  , BMI Body mass index is 33.72 kg/m.  Wt Readings from Last 3 Encounters:  12/29/21 169 lb 12.8 oz (77 kg)  12/28/21 167 lb 8.8 oz (76 kg)  12/20/21 178 lb 9.2 oz (81 kg)    GEN: Well nourished, well developed, in no acute distress. HEENT: normal. Neck: Supple, no JVD, carotid bruits, or masses. Cardiac: RRR, no murmurs, rubs, or gallops. No clubbing, cyanosis, edema.  Radials/PT 2+ and equal bilaterally.  Respiratory:  Respirations regular and unlabored, clear to auscultation bilaterally. GI: Soft, nontender, nondistended. MS: No deformity or atrophy. Skin: Warm and dry, no rash. Neuro:  Strength and sensation are intact. Psych: Normal affect.  Assessment & Plan     PAH - Follows with [facility]. 08/2021 RHC pulmonary artery mild-moderately  elevated, pulmonary wedge pressure mildly elevated,  Prior MV repair - Continue optimal BP and volume control. Echo 08/2021 MV repair with  mild MR, moderate stenosis (mean gradient 9mmHg), LVEF 50-55%, moderately elevated PASP. Previous mean gradient 2017 of 5 mmHg.   HTN - BP well controlled. Continue current antihypertensive regimen Losartan 50mg  QD, Hydralazine 50mg  TID, Coreg 25mg  BID.  CAD - LHC 2016 nonobstructive disease (isolated 30% stenosis). ED visit for chest pain. EKG no acute St/T wave changes. Isolated episode which woke her from sleep at HD and has not recurred. More concerning for sudden fluid volume shift. Myoview 08/2021 with no ischemia. She understandably would prefer to monitor symptoms and discuss settings with HD prior to pursuing invasive cardiac catheterization.   Diastolic heart failure - Volume management per HD. Grossly euvolemic on exam. GDMT includes Carvedilol. Referred to [facility] PT for deconditioning.  Deconditioning - In the setting of multiple comorbidity PAH, HFpEF, ESRD. Referred to [facility] PT.   HLD - Continue Rosuvastatin 20mg  QD. Denies myalgias.   GERD - Continue protonix. Denies indigestion.   Shortness of breath - Episode once per month. Likely multifactorial PAH, deconditioning, HFpEF. Notes PND and encouraged to discuss HD settings with nephrology, utilize wedge pillow at night. [facility] HH PT will improve stamina and remove deconditioning as contributory.   ESRD - Follows with nephrology. Was told not candidate for transplant due to cardiac status - encouraged to seek out more details and let us know if we can be of assistance.  Disposition: Follow up in 2-3 month(s) with [name], MD. Of note previous appointment was erroneously cancelled when this appointment was made and will reach out to Dr. Stanford Breed and his team to see if she can be worked in.   Signed, Loel Dubonnet, NP 12/29/2021, 5:55 PM [place]

## 2021-12-30 ENCOUNTER — Telehealth: Payer: Self-pay | Admitting: Cardiology

## 2021-12-30 DIAGNOSIS — D689 Coagulation defect, unspecified: Secondary | ICD-10-CM | POA: Diagnosis not present

## 2021-12-30 DIAGNOSIS — N186 End stage renal disease: Secondary | ICD-10-CM | POA: Diagnosis not present

## 2021-12-30 DIAGNOSIS — N2581 Secondary hyperparathyroidism of renal origin: Secondary | ICD-10-CM | POA: Diagnosis not present

## 2021-12-30 DIAGNOSIS — D509 Iron deficiency anemia, unspecified: Secondary | ICD-10-CM | POA: Diagnosis not present

## 2021-12-30 DIAGNOSIS — Z992 Dependence on renal dialysis: Secondary | ICD-10-CM | POA: Diagnosis not present

## 2021-12-30 NOTE — Telephone Encounter (Signed)
° °  [facility] Dialysis RN calling, she said pt been having issue with her CP and Low BP, pt was seen by Laurann Montana, on the AVS they thought pt needs to f/u with Dr. Stanford Breed. She said, pt seems like she doesn't have plan of care and needs to know if Dr. Stanford Breed would like to see her for f/u. Zigmund Daniel said to call pt around 11 am, since she is in dialysis right now

## 2021-12-31 ENCOUNTER — Other Ambulatory Visit: Payer: Self-pay

## 2021-12-31 ENCOUNTER — Inpatient Hospital Stay (HOSPITAL_BASED_OUTPATIENT_CLINIC_OR_DEPARTMENT_OTHER)
Admission: EM | Admit: 2021-12-31 | Discharge: 2022-02-25 | DRG: 216 | Disposition: E | Payer: Medicare Other | Attending: Internal Medicine | Admitting: Internal Medicine

## 2021-12-31 ENCOUNTER — Emergency Department (HOSPITAL_BASED_OUTPATIENT_CLINIC_OR_DEPARTMENT_OTHER): Payer: Medicare Other

## 2021-12-31 ENCOUNTER — Encounter (HOSPITAL_BASED_OUTPATIENT_CLINIC_OR_DEPARTMENT_OTHER): Payer: Self-pay | Admitting: Emergency Medicine

## 2021-12-31 DIAGNOSIS — Q2112 Patent foramen ovale: Secondary | ICD-10-CM

## 2021-12-31 DIAGNOSIS — J9601 Acute respiratory failure with hypoxia: Secondary | ICD-10-CM | POA: Diagnosis not present

## 2021-12-31 DIAGNOSIS — R0902 Hypoxemia: Secondary | ICD-10-CM

## 2021-12-31 DIAGNOSIS — E1122 Type 2 diabetes mellitus with diabetic chronic kidney disease: Secondary | ICD-10-CM | POA: Diagnosis not present

## 2021-12-31 DIAGNOSIS — T82590A Other mechanical complication of surgically created arteriovenous fistula, initial encounter: Secondary | ICD-10-CM | POA: Diagnosis not present

## 2021-12-31 DIAGNOSIS — I639 Cerebral infarction, unspecified: Secondary | ICD-10-CM | POA: Diagnosis not present

## 2021-12-31 DIAGNOSIS — G8194 Hemiplegia, unspecified affecting left nondominant side: Secondary | ICD-10-CM | POA: Diagnosis not present

## 2021-12-31 DIAGNOSIS — R570 Cardiogenic shock: Secondary | ICD-10-CM | POA: Diagnosis not present

## 2021-12-31 DIAGNOSIS — N250 Renal osteodystrophy: Secondary | ICD-10-CM | POA: Diagnosis not present

## 2021-12-31 DIAGNOSIS — I480 Paroxysmal atrial fibrillation: Secondary | ICD-10-CM | POA: Diagnosis not present

## 2021-12-31 DIAGNOSIS — N185 Chronic kidney disease, stage 5: Secondary | ICD-10-CM | POA: Diagnosis not present

## 2021-12-31 DIAGNOSIS — I471 Supraventricular tachycardia: Secondary | ICD-10-CM | POA: Diagnosis present

## 2021-12-31 DIAGNOSIS — E1169 Type 2 diabetes mellitus with other specified complication: Secondary | ICD-10-CM | POA: Diagnosis not present

## 2021-12-31 DIAGNOSIS — I2510 Atherosclerotic heart disease of native coronary artery without angina pectoris: Secondary | ICD-10-CM | POA: Diagnosis not present

## 2021-12-31 DIAGNOSIS — R0603 Acute respiratory distress: Secondary | ICD-10-CM

## 2021-12-31 DIAGNOSIS — T82868A Thrombosis of vascular prosthetic devices, implants and grafts, initial encounter: Secondary | ICD-10-CM | POA: Diagnosis not present

## 2021-12-31 DIAGNOSIS — E876 Hypokalemia: Secondary | ICD-10-CM | POA: Diagnosis not present

## 2021-12-31 DIAGNOSIS — D62 Acute posthemorrhagic anemia: Secondary | ICD-10-CM | POA: Diagnosis present

## 2021-12-31 DIAGNOSIS — Z6835 Body mass index (BMI) 35.0-35.9, adult: Secondary | ICD-10-CM

## 2021-12-31 DIAGNOSIS — Z0189 Encounter for other specified special examinations: Secondary | ICD-10-CM

## 2021-12-31 DIAGNOSIS — I2721 Secondary pulmonary arterial hypertension: Secondary | ICD-10-CM | POA: Diagnosis present

## 2021-12-31 DIAGNOSIS — I4891 Unspecified atrial fibrillation: Secondary | ICD-10-CM | POA: Diagnosis present

## 2021-12-31 DIAGNOSIS — J90 Pleural effusion, not elsewhere classified: Secondary | ICD-10-CM | POA: Diagnosis not present

## 2021-12-31 DIAGNOSIS — I25110 Atherosclerotic heart disease of native coronary artery with unstable angina pectoris: Principal | ICD-10-CM | POA: Diagnosis present

## 2021-12-31 DIAGNOSIS — I200 Unstable angina: Secondary | ICD-10-CM | POA: Diagnosis not present

## 2021-12-31 DIAGNOSIS — Z952 Presence of prosthetic heart valve: Secondary | ICD-10-CM | POA: Diagnosis not present

## 2021-12-31 DIAGNOSIS — R0602 Shortness of breath: Secondary | ICD-10-CM | POA: Diagnosis not present

## 2021-12-31 DIAGNOSIS — R079 Chest pain, unspecified: Secondary | ICD-10-CM | POA: Diagnosis not present

## 2021-12-31 DIAGNOSIS — I132 Hypertensive heart and chronic kidney disease with heart failure and with stage 5 chronic kidney disease, or end stage renal disease: Secondary | ICD-10-CM | POA: Diagnosis not present

## 2021-12-31 DIAGNOSIS — Z992 Dependence on renal dialysis: Secondary | ICD-10-CM | POA: Diagnosis not present

## 2021-12-31 DIAGNOSIS — E119 Type 2 diabetes mellitus without complications: Secondary | ICD-10-CM

## 2021-12-31 DIAGNOSIS — I272 Pulmonary hypertension, unspecified: Secondary | ICD-10-CM | POA: Diagnosis present

## 2021-12-31 DIAGNOSIS — E874 Mixed disorder of acid-base balance: Secondary | ICD-10-CM | POA: Diagnosis present

## 2021-12-31 DIAGNOSIS — I6381 Other cerebral infarction due to occlusion or stenosis of small artery: Secondary | ICD-10-CM | POA: Diagnosis not present

## 2021-12-31 DIAGNOSIS — Z452 Encounter for adjustment and management of vascular access device: Secondary | ICD-10-CM | POA: Diagnosis not present

## 2021-12-31 DIAGNOSIS — Z823 Family history of stroke: Secondary | ICD-10-CM

## 2021-12-31 DIAGNOSIS — Z515 Encounter for palliative care: Secondary | ICD-10-CM

## 2021-12-31 DIAGNOSIS — A4159 Other Gram-negative sepsis: Secondary | ICD-10-CM | POA: Diagnosis not present

## 2021-12-31 DIAGNOSIS — Z8673 Personal history of transient ischemic attack (TIA), and cerebral infarction without residual deficits: Secondary | ICD-10-CM | POA: Diagnosis not present

## 2021-12-31 DIAGNOSIS — G9341 Metabolic encephalopathy: Secondary | ICD-10-CM | POA: Diagnosis not present

## 2021-12-31 DIAGNOSIS — Z01818 Encounter for other preprocedural examination: Secondary | ICD-10-CM | POA: Diagnosis not present

## 2021-12-31 DIAGNOSIS — I25118 Atherosclerotic heart disease of native coronary artery with other forms of angina pectoris: Secondary | ICD-10-CM | POA: Diagnosis not present

## 2021-12-31 DIAGNOSIS — R221 Localized swelling, mass and lump, neck: Secondary | ICD-10-CM | POA: Diagnosis not present

## 2021-12-31 DIAGNOSIS — I5042 Chronic combined systolic (congestive) and diastolic (congestive) heart failure: Secondary | ICD-10-CM | POA: Diagnosis not present

## 2021-12-31 DIAGNOSIS — R000 Tachycardia, unspecified: Secondary | ICD-10-CM | POA: Diagnosis not present

## 2021-12-31 DIAGNOSIS — Z86711 Personal history of pulmonary embolism: Secondary | ICD-10-CM

## 2021-12-31 DIAGNOSIS — I3139 Other pericardial effusion (noninflammatory): Secondary | ICD-10-CM | POA: Diagnosis present

## 2021-12-31 DIAGNOSIS — K5900 Constipation, unspecified: Secondary | ICD-10-CM | POA: Diagnosis not present

## 2021-12-31 DIAGNOSIS — I10 Essential (primary) hypertension: Secondary | ICD-10-CM | POA: Diagnosis not present

## 2021-12-31 DIAGNOSIS — E871 Hypo-osmolality and hyponatremia: Secondary | ICD-10-CM | POA: Diagnosis present

## 2021-12-31 DIAGNOSIS — I5021 Acute systolic (congestive) heart failure: Secondary | ICD-10-CM | POA: Diagnosis not present

## 2021-12-31 DIAGNOSIS — Z7901 Long term (current) use of anticoagulants: Secondary | ICD-10-CM

## 2021-12-31 DIAGNOSIS — R1312 Dysphagia, oropharyngeal phase: Secondary | ICD-10-CM | POA: Diagnosis present

## 2021-12-31 DIAGNOSIS — N2581 Secondary hyperparathyroidism of renal origin: Secondary | ICD-10-CM | POA: Diagnosis present

## 2021-12-31 DIAGNOSIS — Z951 Presence of aortocoronary bypass graft: Secondary | ICD-10-CM

## 2021-12-31 DIAGNOSIS — K921 Melena: Secondary | ICD-10-CM | POA: Diagnosis present

## 2021-12-31 DIAGNOSIS — L89892 Pressure ulcer of other site, stage 2: Secondary | ICD-10-CM | POA: Diagnosis not present

## 2021-12-31 DIAGNOSIS — Z803 Family history of malignant neoplasm of breast: Secondary | ICD-10-CM

## 2021-12-31 DIAGNOSIS — K219 Gastro-esophageal reflux disease without esophagitis: Secondary | ICD-10-CM | POA: Diagnosis not present

## 2021-12-31 DIAGNOSIS — D6959 Other secondary thrombocytopenia: Secondary | ICD-10-CM | POA: Diagnosis not present

## 2021-12-31 DIAGNOSIS — B964 Proteus (mirabilis) (morganii) as the cause of diseases classified elsewhere: Secondary | ICD-10-CM | POA: Diagnosis present

## 2021-12-31 DIAGNOSIS — I2582 Chronic total occlusion of coronary artery: Secondary | ICD-10-CM | POA: Diagnosis present

## 2021-12-31 DIAGNOSIS — I25119 Atherosclerotic heart disease of native coronary artery with unspecified angina pectoris: Secondary | ICD-10-CM | POA: Diagnosis not present

## 2021-12-31 DIAGNOSIS — Y832 Surgical operation with anastomosis, bypass or graft as the cause of abnormal reaction of the patient, or of later complication, without mention of misadventure at the time of the procedure: Secondary | ICD-10-CM | POA: Diagnosis not present

## 2021-12-31 DIAGNOSIS — E1165 Type 2 diabetes mellitus with hyperglycemia: Secondary | ICD-10-CM | POA: Diagnosis present

## 2021-12-31 DIAGNOSIS — F05 Delirium due to known physiological condition: Secondary | ICD-10-CM | POA: Diagnosis present

## 2021-12-31 DIAGNOSIS — I4819 Other persistent atrial fibrillation: Secondary | ICD-10-CM | POA: Diagnosis present

## 2021-12-31 DIAGNOSIS — M7989 Other specified soft tissue disorders: Secondary | ICD-10-CM | POA: Diagnosis not present

## 2021-12-31 DIAGNOSIS — R918 Other nonspecific abnormal finding of lung field: Secondary | ICD-10-CM | POA: Diagnosis not present

## 2021-12-31 DIAGNOSIS — Z7982 Long term (current) use of aspirin: Secondary | ICD-10-CM | POA: Diagnosis not present

## 2021-12-31 DIAGNOSIS — I362 Nonrheumatic tricuspid (valve) stenosis with insufficiency: Secondary | ICD-10-CM | POA: Diagnosis not present

## 2021-12-31 DIAGNOSIS — R579 Shock, unspecified: Secondary | ICD-10-CM | POA: Diagnosis not present

## 2021-12-31 DIAGNOSIS — A419 Sepsis, unspecified organism: Secondary | ICD-10-CM | POA: Insufficient documentation

## 2021-12-31 DIAGNOSIS — Z9889 Other specified postprocedural states: Secondary | ICD-10-CM

## 2021-12-31 DIAGNOSIS — J9811 Atelectasis: Secondary | ICD-10-CM | POA: Diagnosis present

## 2021-12-31 DIAGNOSIS — I120 Hypertensive chronic kidney disease with stage 5 chronic kidney disease or end stage renal disease: Secondary | ICD-10-CM | POA: Diagnosis not present

## 2021-12-31 DIAGNOSIS — I361 Nonrheumatic tricuspid (valve) insufficiency: Secondary | ICD-10-CM | POA: Diagnosis not present

## 2021-12-31 DIAGNOSIS — Z66 Do not resuscitate: Secondary | ICD-10-CM | POA: Diagnosis not present

## 2021-12-31 DIAGNOSIS — N644 Mastodynia: Secondary | ICD-10-CM | POA: Diagnosis not present

## 2021-12-31 DIAGNOSIS — D631 Anemia in chronic kidney disease: Secondary | ICD-10-CM

## 2021-12-31 DIAGNOSIS — I5043 Acute on chronic combined systolic (congestive) and diastolic (congestive) heart failure: Secondary | ICD-10-CM | POA: Diagnosis present

## 2021-12-31 DIAGNOSIS — Z9181 History of falling: Secondary | ICD-10-CM | POA: Diagnosis not present

## 2021-12-31 DIAGNOSIS — Z4901 Encounter for fitting and adjustment of extracorporeal dialysis catheter: Secondary | ICD-10-CM | POA: Diagnosis not present

## 2021-12-31 DIAGNOSIS — I50811 Acute right heart failure: Secondary | ICD-10-CM | POA: Diagnosis not present

## 2021-12-31 DIAGNOSIS — M898X9 Other specified disorders of bone, unspecified site: Secondary | ICD-10-CM | POA: Diagnosis present

## 2021-12-31 DIAGNOSIS — I509 Heart failure, unspecified: Secondary | ICD-10-CM | POA: Diagnosis not present

## 2021-12-31 DIAGNOSIS — R7881 Bacteremia: Secondary | ICD-10-CM | POA: Diagnosis not present

## 2021-12-31 DIAGNOSIS — I110 Hypertensive heart disease with heart failure: Secondary | ICD-10-CM | POA: Diagnosis not present

## 2021-12-31 DIAGNOSIS — Z0389 Encounter for observation for other suspected diseases and conditions ruled out: Secondary | ICD-10-CM | POA: Diagnosis not present

## 2021-12-31 DIAGNOSIS — Z01810 Encounter for preprocedural cardiovascular examination: Secondary | ICD-10-CM | POA: Diagnosis not present

## 2021-12-31 DIAGNOSIS — E669 Obesity, unspecified: Secondary | ICD-10-CM | POA: Diagnosis present

## 2021-12-31 DIAGNOSIS — J811 Chronic pulmonary edema: Secondary | ICD-10-CM | POA: Diagnosis not present

## 2021-12-31 DIAGNOSIS — E1129 Type 2 diabetes mellitus with other diabetic kidney complication: Secondary | ICD-10-CM | POA: Diagnosis not present

## 2021-12-31 DIAGNOSIS — Z20822 Contact with and (suspected) exposure to covid-19: Secondary | ICD-10-CM | POA: Diagnosis present

## 2021-12-31 DIAGNOSIS — N186 End stage renal disease: Secondary | ICD-10-CM | POA: Diagnosis not present

## 2021-12-31 DIAGNOSIS — R9431 Abnormal electrocardiogram [ECG] [EKG]: Secondary | ICD-10-CM | POA: Diagnosis present

## 2021-12-31 DIAGNOSIS — Z8249 Family history of ischemic heart disease and other diseases of the circulatory system: Secondary | ICD-10-CM

## 2021-12-31 DIAGNOSIS — N611 Abscess of the breast and nipple: Secondary | ICD-10-CM

## 2021-12-31 DIAGNOSIS — Z4682 Encounter for fitting and adjustment of non-vascular catheter: Secondary | ICD-10-CM | POA: Diagnosis not present

## 2021-12-31 DIAGNOSIS — Z4659 Encounter for fitting and adjustment of other gastrointestinal appliance and device: Secondary | ICD-10-CM

## 2021-12-31 DIAGNOSIS — R29710 NIHSS score 10: Secondary | ICD-10-CM | POA: Diagnosis not present

## 2021-12-31 DIAGNOSIS — E559 Vitamin D deficiency, unspecified: Secondary | ICD-10-CM | POA: Diagnosis not present

## 2021-12-31 DIAGNOSIS — I517 Cardiomegaly: Secondary | ICD-10-CM | POA: Diagnosis not present

## 2021-12-31 DIAGNOSIS — I5082 Biventricular heart failure: Secondary | ICD-10-CM | POA: Diagnosis present

## 2021-12-31 DIAGNOSIS — E785 Hyperlipidemia, unspecified: Secondary | ICD-10-CM | POA: Diagnosis not present

## 2021-12-31 DIAGNOSIS — J189 Pneumonia, unspecified organism: Secondary | ICD-10-CM

## 2021-12-31 DIAGNOSIS — R0789 Other chest pain: Secondary | ICD-10-CM | POA: Diagnosis not present

## 2021-12-31 DIAGNOSIS — E11649 Type 2 diabetes mellitus with hypoglycemia without coma: Secondary | ICD-10-CM | POA: Diagnosis present

## 2021-12-31 DIAGNOSIS — J95821 Acute postprocedural respiratory failure: Secondary | ICD-10-CM

## 2021-12-31 DIAGNOSIS — I081 Rheumatic disorders of both mitral and tricuspid valves: Secondary | ICD-10-CM | POA: Diagnosis not present

## 2021-12-31 DIAGNOSIS — I2781 Cor pulmonale (chronic): Secondary | ICD-10-CM | POA: Diagnosis present

## 2021-12-31 DIAGNOSIS — R600 Localized edema: Secondary | ICD-10-CM | POA: Diagnosis not present

## 2021-12-31 DIAGNOSIS — R6521 Severe sepsis with septic shock: Secondary | ICD-10-CM | POA: Diagnosis not present

## 2021-12-31 DIAGNOSIS — R0600 Dyspnea, unspecified: Secondary | ICD-10-CM | POA: Diagnosis not present

## 2021-12-31 DIAGNOSIS — D6489 Other specified anemias: Secondary | ICD-10-CM | POA: Diagnosis present

## 2021-12-31 DIAGNOSIS — I5032 Chronic diastolic (congestive) heart failure: Secondary | ICD-10-CM | POA: Diagnosis present

## 2021-12-31 DIAGNOSIS — N189 Chronic kidney disease, unspecified: Secondary | ICD-10-CM | POA: Diagnosis not present

## 2021-12-31 DIAGNOSIS — I9589 Other hypotension: Secondary | ICD-10-CM | POA: Diagnosis present

## 2021-12-31 DIAGNOSIS — Z79899 Other long term (current) drug therapy: Secondary | ICD-10-CM

## 2021-12-31 DIAGNOSIS — L8990 Pressure ulcer of unspecified site, unspecified stage: Secondary | ICD-10-CM | POA: Diagnosis not present

## 2021-12-31 DIAGNOSIS — I452 Bifascicular block: Secondary | ICD-10-CM | POA: Diagnosis present

## 2021-12-31 DIAGNOSIS — F419 Anxiety disorder, unspecified: Secondary | ICD-10-CM | POA: Diagnosis present

## 2021-12-31 DIAGNOSIS — R072 Precordial pain: Secondary | ICD-10-CM | POA: Diagnosis not present

## 2021-12-31 HISTORY — DX: Acute postprocedural respiratory failure: J95.821

## 2021-12-31 LAB — CBC
HCT: 34.9 % — ABNORMAL LOW (ref 36.0–46.0)
Hemoglobin: 10.7 g/dL — ABNORMAL LOW (ref 12.0–15.0)
MCH: 26.7 pg (ref 26.0–34.0)
MCHC: 30.7 g/dL (ref 30.0–36.0)
MCV: 87 fL (ref 80.0–100.0)
Platelets: 130 10*3/uL — ABNORMAL LOW (ref 150–400)
RBC: 4.01 MIL/uL (ref 3.87–5.11)
RDW: 21.9 % — ABNORMAL HIGH (ref 11.5–15.5)
WBC: 3.8 10*3/uL — ABNORMAL LOW (ref 4.0–10.5)
nRBC: 1.9 % — ABNORMAL HIGH (ref 0.0–0.2)

## 2021-12-31 LAB — BASIC METABOLIC PANEL
Anion gap: 14 (ref 5–15)
BUN: 29 mg/dL — ABNORMAL HIGH (ref 8–23)
CO2: 32 mmol/L (ref 22–32)
Calcium: 9 mg/dL (ref 8.9–10.3)
Chloride: 91 mmol/L — ABNORMAL LOW (ref 98–111)
Creatinine, Ser: 7.25 mg/dL — ABNORMAL HIGH (ref 0.44–1.00)
GFR, Estimated: 6 mL/min — ABNORMAL LOW (ref >60)
Glucose, Bld: 119 mg/dL — ABNORMAL HIGH (ref 70–99)
Potassium: 3.2 mmol/L — ABNORMAL LOW (ref 3.5–5.1)
Sodium: 137 mmol/L (ref 135–145)

## 2021-12-31 LAB — TROPONIN I (HIGH SENSITIVITY)
Troponin I (High Sensitivity): 210 ng/L (ref <18)
Troponin I (High Sensitivity): 201 ng/L (ref <18)

## 2021-12-31 LAB — RESP PANEL BY RT-PCR (FLU A&B, COVID) ARPGX2
Influenza A by PCR: NEGATIVE
Influenza B by PCR: NEGATIVE
SARS Coronavirus 2 by RT PCR: NEGATIVE

## 2021-12-31 MED ORDER — SODIUM CHLORIDE 0.9 % IV BOLUS
500.0000 mL | Freq: Once | INTRAVENOUS | Status: AC
  Administered 2021-12-31: 500 mL via INTRAVENOUS

## 2021-12-31 NOTE — ED Notes (Signed)
Dr. Alvino Chapel aware of 210 troponin

## 2021-12-31 NOTE — ED Notes (Signed)
Pts HR increased to 130s-140s on cardiac monitor.  This RN went to see pt.  Pt states she suddenly has a headache and that chest pain is worse.  BP 86/75. Repeat EKG obtained.  Provider made aware.

## 2021-12-31 NOTE — ED Provider Notes (Signed)
[facility] EMERGENCY DEPT Provider Note   CSN: [id number] Arrival date & time: 01/14/2022  1810     History  Chief Complaint  Patient presents with   Chest Pain   Arm Pain    Jody Nguyen is a 66 y.o. female.   Chest Pain Associated symptoms: no abdominal pain, no back pain and no shortness of breath   Arm Pain Associated symptoms include chest pain. Pertinent negatives include no abdominal pain and no shortness of breath.  Patient turns with another episode of chest pain.  Recently seen in the ER for same.  Has had cardiology follow-up.  Pain in the anterior chest and goes to both arms.  Worse with dialysis.  States another episode today.  Has had pain over the last couple days.  Had nonobstructive coronary artery disease all that was around 7 years ago now.  Had negative stress test about 4 months ago.  Upon arrival had nonspecific EKG changes.  Some artifact in leads I and aVL.  Appear to be sinus rhythm.  States the same pain that she is been having.  No fevers or chills.  States she has a headache and feels bad with the episodes.   Past Medical History:  Diagnosis Date   Anemia    Chronic diastolic CHF (congestive heart failure) (HCC)    Chronic diastolic heart failure (HCC)    CKD (chronic kidney disease) stage 4, GFR 15-29 ml/min (HCC)    dialysis M/W/F   Constipation    CVA (cerebral infarction) 1997   no residual deficit   Diverticulitis    DM (diabetes mellitus) (HCC)    type 2   Heart murmur    History of cardiovascular stress test    a. Myoview Oct 2012 showed EF 49%, no ischemia, LVE   HLD (hyperlipidemia)    takes Crestor daily   Hypertension    Pericardial effusion    chronic; felt to be poss related to minoxidil >> DC'd   Pulmonary hypertension ([place]) 06/18/2015   Respiratory failure, acute hypoxic, post-operative 07/08/2015   Requiring ECMO support   S/P cardiac catheterization    a. R/L [facility] 06/18/15:  mLAD 30%; severe pulmo HTN with PA  sat 43%, CI 1.86, prominent V waves indicative of MR; resting hypoxemia O2 sat 86% on RA   S/P minimally invasive mitral valve repair 07/08/2015   Complex valvuloplasty including artificial Gore-tex neochord placement x10 and 26 mm Sorin Memo 3D Rechord ring annuloplasty via right mini thoracotomy approach   Severe mitral regurgitation    Shortness of breath dyspnea    Stroke ([place]) 1997   no residual effect   Vitamin D deficiency    Wears glasses     Home Medications Prior to Admission medications   Medication Sig Start Date End Date Taking? Authorizing Provider  acetaminophen (TYLENOL) 500 MG tablet Take 500 mg by mouth as needed (for pain.).    [provider]  apixaban (ELIQUIS) 5 MG TABS tablet Take 5 mg by mouth 2 (two) times daily.    [provider]  aspirin EC 81 MG tablet Take 81 mg by mouth daily.    [provider]  azelastine (ASTELIN) 0.1 % nasal spray Use 1 to 2 sprays each nostril 2 to 3 x / day 03/26/18   Unk Pinto, MD  azelastine (ASTELIN) 0.1 % nasal spray Place into the nose. Patient not taking: Reported on 12/29/2021 08/31/21   [provider]  carvedilol (COREG) 25 MG tablet  Take 25 mg by mouth daily.    [provider]  cinacalcet (SENSIPAR) 60 MG tablet Take 1 tablet (60 mg total) by mouth daily. Patient taking differently: Take 60 mg by mouth See admin instructions. Takes at dialysis M/W/F 09/11/17   Unk Pinto, MD  Doxercalciferol (HECTOROL IV) Doxercalciferol (Hectorol) 12/07/21 12/04/22  [provider]  Doxercalciferol (HECTOROL IV) Doxercalciferol (Hectorol) 12/21/21 12/20/22  [provider]  hydrALAZINE (APRESOLINE) 50 MG tablet Take 1 tablet (50 mg total) by mouth 3 (three) times daily. FOR BLOOD  PRESSURE AND HEART 11/09/20   Lelon Perla, MD  latanoprost (XALATAN) 0.005 % ophthalmic solution Place 1 drop into both eyes at bedtime. 09/16/18   [provider]  losartan (COZAAR)  50 MG tablet Take 1 tablet Daily for BP Patient taking differently: Take 50 mg by mouth daily. 03/19/20   Unk Pinto, MD  macitentan (OPSUMIT) 10 MG tablet Take 10 mg by mouth daily.    [provider]  Methoxy PEG-Epoetin Beta (MIRCERA IJ) Mircera 12/07/21 12/06/22  [provider]  nitroGLYCERIN (NITROSTAT) 0.4 MG SL tablet Place under the tongue. 08/24/21   [provider]  ondansetron (ZOFRAN ODT) 4 MG disintegrating tablet Take 1 tablet (4 mg total) by mouth every 8 (eight) hours as needed for nausea or vomiting. 02/23/19   Rodell Perna A, PA-C  pantoprazole (PROTONIX) 40 MG tablet Take 1 tablet (40 mg total) by mouth daily. 08/29/21 11/24/21  Barb Merino, MD  RENVELA 800 MG tablet Take 1,600 mg by mouth See admin instructions. 1600 mg (2 caps) with every meal 12/21/15   [provider]  rosuvastatin (CRESTOR) 20 MG tablet Take 1 tablet Daily for Cholesterol Patient taking differently: Take 20 mg by mouth at bedtime. 03/19/20   Unk Pinto, MD  sertraline (ZOLOFT) 25 MG tablet Take 1 tablet Daily for Mood Patient taking differently: Take 25 mg by mouth daily. 03/19/20   Unk Pinto, MD  sildenafil (REVATIO) 20 MG tablet Take 2 tablets (40 mg) 3 x  /day fr Pulmonary Hypertension Patient taking differently: Take 40 mg by mouth 3 (three) times daily. 03/26/20   Unk Pinto, MD      Allergies    Minoxidil, Lipitor [atorvastatin], Morphine and related, and Ace inhibitors    Review of Systems   Review of Systems  Constitutional:  Negative for appetite change.  Respiratory:  Negative for shortness of breath.   Cardiovascular:  Positive for chest pain.  Gastrointestinal:  Negative for abdominal pain.  Genitourinary:  Negative for flank pain.  Musculoskeletal:  Negative for back pain.   Physical Exam Updated Vital Signs BP (!) 125/92    Pulse 93    Temp 97.9 F (36.6 C)    Resp (!) 22    Ht 4' 11.5" (1.511 m)    Wt 76.7 kg    SpO2 99%    BMI  33.56 kg/m  Physical Exam Vitals and nursing note reviewed.  Cardiovascular:     Rate and Rhythm: Normal rate and regular rhythm.  Pulmonary:     Breath sounds: No wheezing.  Chest:     Chest wall: No tenderness.  Musculoskeletal:     Cervical back: Neck supple.     Right lower leg: No edema.     Left lower leg: No edema.     Comments: Dialysis access right forearm.  Skin:    Capillary Refill: Capillary refill takes less than 2 seconds.  Neurological:     Mental Status:  She is alert.    ED Results / Procedures / Treatments   Labs (all labs ordered are listed, but only abnormal results are displayed) Labs Reviewed  BASIC METABOLIC PANEL - Abnormal; Notable for the following components:      Result Value   Potassium 3.2 (*)    Chloride 91 (*)    Glucose, Bld 119 (*)    BUN 29 (*)    Creatinine, Ser 7.25 (*)    GFR, Estimated 6 (*)    All other components within normal limits  CBC - Abnormal; Notable for the following components:   WBC 3.8 (*)    Hemoglobin 10.7 (*)    HCT 34.9 (*)    RDW 21.9 (*)    Platelets 130 (*)    nRBC 1.9 (*)    All other components within normal limits  TROPONIN I (HIGH SENSITIVITY) - Abnormal; Notable for the following components:   Troponin I (High Sensitivity) 210 (*)    All other components within normal limits  TROPONIN I (HIGH SENSITIVITY) - Abnormal; Notable for the following components:   Troponin I (High Sensitivity) 201 (*)    All other components within normal limits  RESP PANEL BY RT-PCR (FLU A&B, COVID) [id number]    EKG EKG Interpretation  Date/Time:  Saturday December 31 2021 19:38:09 EST Ventricular Rate:  137 PR Interval:  178 QRS Duration: 170 QT Interval:  368 QTC Calculation: 556 R Axis:   256 Text Interpretation: Sinus tachycardia Probable left atrial enlargement RBBB and LAFB Repol abnrm suggests ischemia, lateral leads Confirmed by Davonna Belling [phone]) on 01/18/2022 11:03:50 PM  Radiology DG Chest Port 1  View  Result Date: 01/23/2022 CLINICAL DATA:  Generalized chest pain and shortness of breath since yesterday. EXAM: PORTABLE CHEST 1 VIEW COMPARISON:  Chest x-rays dated 12/28/2021 and 12/20/2021. FINDINGS: Stable cardiomegaly. Lungs are clear. No pleural effusion or pneumothorax is seen. Osseous structures about the chest are unremarkable. IMPRESSION: 1. No active disease. No evidence of pneumonia or pulmonary edema. 2. Stable cardiomegaly. Electronically Signed   By: Franki Cabot M.D.   On: 01/16/2022 19:07    Procedures Procedures    Medications Ordered in ED Medications  sodium chloride 0.9 % bolus 500 mL (0 mLs Intravenous Stopped 12/30/2021 2115)    ED Course/ Medical Decision Making/ A&P                           Medical Decision Making Amount and/or Complexity of Data Reviewed Labs: ordered. Radiology: ordered.   Patient denies with chest pain.  Anterior chest goes to arms.  Similar previous pain but states more severe.  EKG initially shows artifact.  That quickly improved on recheck, however then developed tachycardia.  Appeared to be sinus but was rather regular but 130 240 bpm.  Became more hypotensive with this.  Fluid bolus given and later heart rate normalized.  Appear to stop quickly.  History of end-stage renal disease.  Troponin has gone up to 200.  Recent admission in the hospital and also has had troponins that appear to run around 100.  Chest x-ray reassuring.  No pulmonary edema.  I have reviewed recent cardiology notes both here and through [facility].  With elevated troponin and chest pain along with the arrhythmia I feels the patient would benefit from mission to the hospital.  I discussed the initial EKGs with Dr. Rudi Rummage, from cardiology.  Not a STEMI.  CRITICAL CARE Performed by:  Davonna Belling Total critical care time: 30 minutes Critical care time was exclusive of separately billable procedures and treating other patients. Critical care was necessary to  treat or prevent imminent or life-threatening deterioration. Critical care was time spent personally by me on the following activities: development of treatment plan with patient and/or surrogate as well as nursing, discussions with consultants, evaluation of patient's response to treatment, examination of patient, obtaining history from patient or surrogate, ordering and performing treatments and interventions, ordering and review of laboratory studies, ordering and review of radiographic studies, pulse oximetry and re-evaluation of patient's condition.         Final Clinical Impression(s) / ED Diagnoses Final diagnoses:  End stage renal disease on dialysis [facility])  Chest pain, unspecified type  Tachycardia    Rx / DC Orders ED Discharge Orders     None         Davonna Belling, MD 01/05/2022 2304

## 2021-12-31 NOTE — ED Triage Notes (Signed)
Pt reports pain all over, including both arms and chest x1 day. Intermittent SOB. Last HD Friday.

## 2022-01-01 ENCOUNTER — Other Ambulatory Visit (HOSPITAL_COMMUNITY): Payer: Medicare Other

## 2022-01-01 DIAGNOSIS — R0600 Dyspnea, unspecified: Secondary | ICD-10-CM | POA: Diagnosis not present

## 2022-01-01 DIAGNOSIS — I361 Nonrheumatic tricuspid (valve) insufficiency: Secondary | ICD-10-CM | POA: Diagnosis not present

## 2022-01-01 DIAGNOSIS — I200 Unstable angina: Secondary | ICD-10-CM | POA: Diagnosis not present

## 2022-01-01 DIAGNOSIS — N250 Renal osteodystrophy: Secondary | ICD-10-CM | POA: Diagnosis not present

## 2022-01-01 DIAGNOSIS — Z8673 Personal history of transient ischemic attack (TIA), and cerebral infarction without residual deficits: Secondary | ICD-10-CM

## 2022-01-01 DIAGNOSIS — I517 Cardiomegaly: Secondary | ICD-10-CM | POA: Diagnosis not present

## 2022-01-01 DIAGNOSIS — Z9889 Other specified postprocedural states: Secondary | ICD-10-CM | POA: Diagnosis not present

## 2022-01-01 DIAGNOSIS — Z4682 Encounter for fitting and adjustment of non-vascular catheter: Secondary | ICD-10-CM | POA: Diagnosis not present

## 2022-01-01 DIAGNOSIS — E785 Hyperlipidemia, unspecified: Secondary | ICD-10-CM | POA: Diagnosis not present

## 2022-01-01 DIAGNOSIS — I6381 Other cerebral infarction due to occlusion or stenosis of small artery: Secondary | ICD-10-CM | POA: Diagnosis not present

## 2022-01-01 DIAGNOSIS — N186 End stage renal disease: Secondary | ICD-10-CM

## 2022-01-01 DIAGNOSIS — D62 Acute posthemorrhagic anemia: Secondary | ICD-10-CM | POA: Diagnosis not present

## 2022-01-01 DIAGNOSIS — R0602 Shortness of breath: Secondary | ICD-10-CM | POA: Diagnosis not present

## 2022-01-01 DIAGNOSIS — R079 Chest pain, unspecified: Secondary | ICD-10-CM

## 2022-01-01 DIAGNOSIS — D631 Anemia in chronic kidney disease: Secondary | ICD-10-CM | POA: Diagnosis not present

## 2022-01-01 DIAGNOSIS — I25110 Atherosclerotic heart disease of native coronary artery with unstable angina pectoris: Secondary | ICD-10-CM | POA: Diagnosis not present

## 2022-01-01 DIAGNOSIS — Z452 Encounter for adjustment and management of vascular access device: Secondary | ICD-10-CM | POA: Diagnosis not present

## 2022-01-01 DIAGNOSIS — Z952 Presence of prosthetic heart valve: Secondary | ICD-10-CM | POA: Diagnosis not present

## 2022-01-01 DIAGNOSIS — R7881 Bacteremia: Secondary | ICD-10-CM | POA: Diagnosis not present

## 2022-01-01 DIAGNOSIS — E1169 Type 2 diabetes mellitus with other specified complication: Secondary | ICD-10-CM

## 2022-01-01 DIAGNOSIS — I5032 Chronic diastolic (congestive) heart failure: Secondary | ICD-10-CM

## 2022-01-01 DIAGNOSIS — Z515 Encounter for palliative care: Secondary | ICD-10-CM | POA: Diagnosis not present

## 2022-01-01 DIAGNOSIS — R000 Tachycardia, unspecified: Secondary | ICD-10-CM | POA: Diagnosis present

## 2022-01-01 DIAGNOSIS — I362 Nonrheumatic tricuspid (valve) stenosis with insufficiency: Secondary | ICD-10-CM | POA: Diagnosis not present

## 2022-01-01 DIAGNOSIS — E669 Obesity, unspecified: Secondary | ICD-10-CM

## 2022-01-01 DIAGNOSIS — R600 Localized edema: Secondary | ICD-10-CM | POA: Diagnosis not present

## 2022-01-01 DIAGNOSIS — J9811 Atelectasis: Secondary | ICD-10-CM | POA: Diagnosis not present

## 2022-01-01 DIAGNOSIS — I272 Pulmonary hypertension, unspecified: Secondary | ICD-10-CM | POA: Diagnosis not present

## 2022-01-01 DIAGNOSIS — R9431 Abnormal electrocardiogram [ECG] [EKG]: Secondary | ICD-10-CM

## 2022-01-01 DIAGNOSIS — I5043 Acute on chronic combined systolic (congestive) and diastolic (congestive) heart failure: Secondary | ICD-10-CM | POA: Diagnosis not present

## 2022-01-01 DIAGNOSIS — Y832 Surgical operation with anastomosis, bypass or graft as the cause of abnormal reaction of the patient, or of later complication, without mention of misadventure at the time of the procedure: Secondary | ICD-10-CM | POA: Diagnosis not present

## 2022-01-01 DIAGNOSIS — R579 Shock, unspecified: Secondary | ICD-10-CM | POA: Diagnosis not present

## 2022-01-01 DIAGNOSIS — Z0389 Encounter for observation for other suspected diseases and conditions ruled out: Secondary | ICD-10-CM | POA: Diagnosis not present

## 2022-01-01 DIAGNOSIS — E871 Hypo-osmolality and hyponatremia: Secondary | ICD-10-CM | POA: Diagnosis not present

## 2022-01-01 DIAGNOSIS — R6521 Severe sepsis with septic shock: Secondary | ICD-10-CM | POA: Diagnosis not present

## 2022-01-01 DIAGNOSIS — Z01810 Encounter for preprocedural cardiovascular examination: Secondary | ICD-10-CM | POA: Diagnosis not present

## 2022-01-01 DIAGNOSIS — I471 Supraventricular tachycardia: Secondary | ICD-10-CM | POA: Diagnosis not present

## 2022-01-01 DIAGNOSIS — Z992 Dependence on renal dialysis: Secondary | ICD-10-CM | POA: Diagnosis not present

## 2022-01-01 DIAGNOSIS — I081 Rheumatic disorders of both mitral and tricuspid valves: Secondary | ICD-10-CM | POA: Diagnosis not present

## 2022-01-01 DIAGNOSIS — A4159 Other Gram-negative sepsis: Secondary | ICD-10-CM | POA: Diagnosis not present

## 2022-01-01 DIAGNOSIS — N189 Chronic kidney disease, unspecified: Secondary | ICD-10-CM | POA: Diagnosis not present

## 2022-01-01 DIAGNOSIS — J90 Pleural effusion, not elsewhere classified: Secondary | ICD-10-CM | POA: Diagnosis not present

## 2022-01-01 DIAGNOSIS — R0902 Hypoxemia: Secondary | ICD-10-CM | POA: Diagnosis not present

## 2022-01-01 DIAGNOSIS — I3139 Other pericardial effusion (noninflammatory): Secondary | ICD-10-CM | POA: Diagnosis not present

## 2022-01-01 DIAGNOSIS — R918 Other nonspecific abnormal finding of lung field: Secondary | ICD-10-CM | POA: Diagnosis not present

## 2022-01-01 DIAGNOSIS — I509 Heart failure, unspecified: Secondary | ICD-10-CM | POA: Diagnosis not present

## 2022-01-01 DIAGNOSIS — E11649 Type 2 diabetes mellitus with hypoglycemia without coma: Secondary | ICD-10-CM | POA: Diagnosis present

## 2022-01-01 DIAGNOSIS — D6959 Other secondary thrombocytopenia: Secondary | ICD-10-CM | POA: Diagnosis present

## 2022-01-01 DIAGNOSIS — Q2112 Patent foramen ovale: Secondary | ICD-10-CM | POA: Diagnosis not present

## 2022-01-01 DIAGNOSIS — I5021 Acute systolic (congestive) heart failure: Secondary | ICD-10-CM | POA: Diagnosis not present

## 2022-01-01 DIAGNOSIS — E1122 Type 2 diabetes mellitus with diabetic chronic kidney disease: Secondary | ICD-10-CM | POA: Diagnosis not present

## 2022-01-01 DIAGNOSIS — R072 Precordial pain: Secondary | ICD-10-CM | POA: Diagnosis not present

## 2022-01-01 DIAGNOSIS — I480 Paroxysmal atrial fibrillation: Secondary | ICD-10-CM | POA: Diagnosis not present

## 2022-01-01 DIAGNOSIS — Z66 Do not resuscitate: Secondary | ICD-10-CM | POA: Diagnosis not present

## 2022-01-01 DIAGNOSIS — I2721 Secondary pulmonary arterial hypertension: Secondary | ICD-10-CM | POA: Diagnosis present

## 2022-01-01 DIAGNOSIS — I639 Cerebral infarction, unspecified: Secondary | ICD-10-CM | POA: Diagnosis not present

## 2022-01-01 DIAGNOSIS — M7989 Other specified soft tissue disorders: Secondary | ICD-10-CM | POA: Diagnosis not present

## 2022-01-01 DIAGNOSIS — J9601 Acute respiratory failure with hypoxia: Secondary | ICD-10-CM | POA: Diagnosis present

## 2022-01-01 DIAGNOSIS — I50811 Acute right heart failure: Secondary | ICD-10-CM | POA: Diagnosis not present

## 2022-01-01 DIAGNOSIS — A419 Sepsis, unspecified organism: Secondary | ICD-10-CM | POA: Diagnosis not present

## 2022-01-01 DIAGNOSIS — I5042 Chronic combined systolic (congestive) and diastolic (congestive) heart failure: Secondary | ICD-10-CM | POA: Diagnosis not present

## 2022-01-01 DIAGNOSIS — I4819 Other persistent atrial fibrillation: Secondary | ICD-10-CM | POA: Diagnosis not present

## 2022-01-01 DIAGNOSIS — N644 Mastodynia: Secondary | ICD-10-CM | POA: Diagnosis not present

## 2022-01-01 DIAGNOSIS — R570 Cardiogenic shock: Secondary | ICD-10-CM | POA: Diagnosis not present

## 2022-01-01 DIAGNOSIS — I110 Hypertensive heart disease with heart failure: Secondary | ICD-10-CM | POA: Diagnosis not present

## 2022-01-01 DIAGNOSIS — R221 Localized swelling, mass and lump, neck: Secondary | ICD-10-CM | POA: Diagnosis not present

## 2022-01-01 DIAGNOSIS — Z01818 Encounter for other preprocedural examination: Secondary | ICD-10-CM | POA: Diagnosis not present

## 2022-01-01 DIAGNOSIS — I120 Hypertensive chronic kidney disease with stage 5 chronic kidney disease or end stage renal disease: Secondary | ICD-10-CM | POA: Diagnosis not present

## 2022-01-01 DIAGNOSIS — K219 Gastro-esophageal reflux disease without esophagitis: Secondary | ICD-10-CM | POA: Diagnosis not present

## 2022-01-01 DIAGNOSIS — E1129 Type 2 diabetes mellitus with other diabetic kidney complication: Secondary | ICD-10-CM | POA: Diagnosis not present

## 2022-01-01 DIAGNOSIS — J811 Chronic pulmonary edema: Secondary | ICD-10-CM | POA: Diagnosis not present

## 2022-01-01 DIAGNOSIS — I132 Hypertensive heart and chronic kidney disease with heart failure and with stage 5 chronic kidney disease, or end stage renal disease: Secondary | ICD-10-CM | POA: Diagnosis present

## 2022-01-01 DIAGNOSIS — Z794 Long term (current) use of insulin: Secondary | ICD-10-CM

## 2022-01-01 DIAGNOSIS — I25119 Atherosclerotic heart disease of native coronary artery with unspecified angina pectoris: Secondary | ICD-10-CM | POA: Diagnosis not present

## 2022-01-01 DIAGNOSIS — R0603 Acute respiratory distress: Secondary | ICD-10-CM | POA: Diagnosis not present

## 2022-01-01 DIAGNOSIS — G9341 Metabolic encephalopathy: Secondary | ICD-10-CM | POA: Diagnosis not present

## 2022-01-01 DIAGNOSIS — T82590A Other mechanical complication of surgically created arteriovenous fistula, initial encounter: Secondary | ICD-10-CM | POA: Diagnosis not present

## 2022-01-01 DIAGNOSIS — I2510 Atherosclerotic heart disease of native coronary artery without angina pectoris: Secondary | ICD-10-CM | POA: Diagnosis not present

## 2022-01-01 DIAGNOSIS — I10 Essential (primary) hypertension: Secondary | ICD-10-CM | POA: Diagnosis not present

## 2022-01-01 DIAGNOSIS — Z20822 Contact with and (suspected) exposure to covid-19: Secondary | ICD-10-CM | POA: Diagnosis not present

## 2022-01-01 DIAGNOSIS — N185 Chronic kidney disease, stage 5: Secondary | ICD-10-CM | POA: Diagnosis not present

## 2022-01-01 LAB — APTT: aPTT: 88 seconds — ABNORMAL HIGH (ref 24–36)

## 2022-01-01 LAB — LACTIC ACID, PLASMA: Lactic Acid, Venous: 2 mmol/L (ref 0.5–1.9)

## 2022-01-01 LAB — BASIC METABOLIC PANEL
Anion gap: 18 — ABNORMAL HIGH (ref 5–15)
BUN: 30 mg/dL — ABNORMAL HIGH (ref 8–23)
CO2: 24 mmol/L (ref 22–32)
Calcium: 8.8 mg/dL — ABNORMAL LOW (ref 8.9–10.3)
Chloride: 95 mmol/L — ABNORMAL LOW (ref 98–111)
Creatinine, Ser: 7.94 mg/dL — ABNORMAL HIGH (ref 0.44–1.00)
GFR, Estimated: 5 mL/min — ABNORMAL LOW (ref >60)
Glucose, Bld: 112 mg/dL — ABNORMAL HIGH (ref 70–99)
Potassium: 3.8 mmol/L (ref 3.5–5.1)
Sodium: 137 mmol/L (ref 135–145)

## 2022-01-01 LAB — GLUCOSE, CAPILLARY
Glucose-Capillary: 116 mg/dL — ABNORMAL HIGH (ref 70–99)
Glucose-Capillary: 165 mg/dL — ABNORMAL HIGH (ref 70–99)
Glucose-Capillary: 118 mg/dL — ABNORMAL HIGH (ref 70–99)
Glucose-Capillary: 137 mg/dL — ABNORMAL HIGH (ref 70–99)

## 2022-01-01 LAB — D-DIMER, QUANTITATIVE: D-Dimer, Quant: 1.22 ug/mL-FEU — ABNORMAL HIGH (ref 0.00–0.50)

## 2022-01-01 LAB — HEPATIC FUNCTION PANEL
ALT: 24 U/L (ref 0–44)
AST: 23 U/L (ref 15–41)
Albumin: 3.8 g/dL (ref 3.5–5.0)
Alkaline Phosphatase: 128 U/L — ABNORMAL HIGH (ref 38–126)
Bilirubin, Direct: 0.2 mg/dL (ref 0.0–0.2)
Indirect Bilirubin: 0.6 mg/dL (ref 0.3–0.9)
Total Bilirubin: 0.8 mg/dL (ref 0.3–1.2)
Total Protein: 7.7 g/dL (ref 6.5–8.1)

## 2022-01-01 LAB — HEPARIN LEVEL (UNFRACTIONATED)
Heparin Unfractionated: 0.28 IU/mL — ABNORMAL LOW (ref 0.30–0.70)
Heparin Unfractionated: 0.44 IU/mL (ref 0.30–0.70)

## 2022-01-01 LAB — TROPONIN I (HIGH SENSITIVITY)
Troponin I (High Sensitivity): 194 ng/L (ref <18)
Troponin I (High Sensitivity): 189 ng/L (ref <18)
Troponin I (High Sensitivity): 207 ng/L (ref <18)

## 2022-01-01 LAB — MAGNESIUM: Magnesium: 2.3 mg/dL (ref 1.7–2.4)

## 2022-01-01 LAB — BRAIN NATRIURETIC PEPTIDE: B Natriuretic Peptide: 1432.4 pg/mL — ABNORMAL HIGH (ref 0.0–100.0)

## 2022-01-01 LAB — HIV ANTIBODY (ROUTINE TESTING W REFLEX): HIV Screen 4th Generation wRfx: NONREACTIVE

## 2022-01-01 MED ORDER — SILDENAFIL CITRATE 20 MG PO TABS
40.0000 mg | ORAL_TABLET | Freq: Three times a day (TID) | ORAL | Status: DC
  Administered 2022-01-01 – 2022-01-05 (×10): 40 mg via ORAL
  Filled 2022-01-01 (×11): qty 2

## 2022-01-01 MED ORDER — INSULIN ASPART 100 UNIT/ML IJ SOLN
0.0000 [IU] | Freq: Three times a day (TID) | INTRAMUSCULAR | Status: DC
  Administered 2022-01-01 – 2022-01-05 (×2): 1 [IU] via SUBCUTANEOUS

## 2022-01-01 MED ORDER — ACETAMINOPHEN 650 MG RE SUPP: 650.0000 mg | Freq: Four times a day (QID) | RECTAL | Status: DC | PRN

## 2022-01-01 MED ORDER — CHLORHEXIDINE GLUCONATE CLOTH 2 % EX PADS: 6.0000 | MEDICATED_PAD | Freq: Every day | CUTANEOUS | Status: DC

## 2022-01-01 MED ORDER — HEPARIN (PORCINE) 25000 UT/250ML-% IV SOLN
1200.0000 [IU]/h | INTRAVENOUS | Status: DC
  Administered 2022-01-01: 1100 [IU]/h via INTRAVENOUS
  Administered 2022-01-02 – 2022-01-03 (×2): 1200 [IU]/h via INTRAVENOUS
  Filled 2022-01-01 (×3): qty 250

## 2022-01-01 MED ORDER — HYDRALAZINE HCL 25 MG PO TABS: 25.0000 mg | ORAL_TABLET | Freq: Three times a day (TID) | ORAL | Status: DC | PRN

## 2022-01-01 MED ORDER — PANTOPRAZOLE SODIUM 40 MG PO TBEC: 40.0000 mg | DELAYED_RELEASE_TABLET | Freq: Every day | ORAL | Status: DC | PRN

## 2022-01-01 MED ORDER — ROSUVASTATIN CALCIUM 20 MG PO TABS
20.0000 mg | ORAL_TABLET | Freq: Every day | ORAL | Status: DC
  Administered 2022-01-01 – 2022-01-05 (×5): 20 mg via ORAL
  Filled 2022-01-01 (×5): qty 1

## 2022-01-01 MED ORDER — ASPIRIN EC 81 MG PO TBEC
81.0000 mg | DELAYED_RELEASE_TABLET | Freq: Every day | ORAL | Status: DC
  Administered 2022-01-01 – 2022-01-02 (×2): 81 mg via ORAL
  Filled 2022-01-01 (×3): qty 1

## 2022-01-01 MED ORDER — MACITENTAN 10 MG PO TABS
10.0000 mg | ORAL_TABLET | Freq: Every day | ORAL | Status: DC
  Administered 2022-01-01 – 2022-01-05 (×5): 10 mg via ORAL
  Filled 2022-01-01 (×7): qty 1

## 2022-01-01 MED ORDER — CINACALCET HCL 30 MG PO TABS
90.0000 mg | ORAL_TABLET | ORAL | Status: DC
  Administered 2022-01-02 – 2022-01-04 (×2): 90 mg via ORAL
  Filled 2022-01-01 (×4): qty 3

## 2022-01-01 MED ORDER — INSULIN ASPART 100 UNIT/ML IJ SOLN: 0.0000 [IU] | Freq: Every day | INTRAMUSCULAR | Status: DC

## 2022-01-01 MED ORDER — MACITENTAN 10 MG PO TABS: 10.0000 mg | ORAL_TABLET | Freq: Every day | ORAL | Status: DC

## 2022-01-01 MED ORDER — ACETAMINOPHEN 325 MG PO TABS
650.0000 mg | ORAL_TABLET | Freq: Four times a day (QID) | ORAL | Status: DC | PRN
  Administered 2022-01-02: 650 mg via ORAL
  Filled 2022-01-01 (×3): qty 2

## 2022-01-01 MED ORDER — DOXERCALCIFEROL 4 MCG/2ML IV SOLN
9.0000 ug | INTRAVENOUS | Status: DC
  Administered 2022-01-04 – 2022-02-03 (×5): 9 ug via INTRAVENOUS
  Filled 2022-01-01 (×19): qty 6

## 2022-01-01 MED ORDER — LATANOPROST 0.005 % OP SOLN
1.0000 [drp] | Freq: Every day | OPHTHALMIC | Status: DC
  Administered 2022-01-01 – 2022-02-03 (×32): 1 [drp] via OPHTHALMIC
  Filled 2022-01-01 (×4): qty 2.5

## 2022-01-01 NOTE — Progress Notes (Signed)
Time: 1122 Test: lactic acid Critical Value: 2.0  Name of Provider Notified: [place] Randall Hiss   Orders Received? Or Actions Taken?: no new orders

## 2022-01-01 NOTE — Consult Note (Addendum)
NAME:  Jody Nguyen, MRN:  [id number], DOB:  [birth year], LOS: 0 ADMISSION DATE:  12/29/2021, CONSULTATION DATE:  01/01/22 REFERRING MD:  Martie Lee - TRH, CHIEF COMPLAINT:  SOB    History of Present Illness:  66 yo F PMH ESRD, non-obstructive CAD, suspected pulmonary hypertension, Hx PE on eliquis who presented to [facility] ED 2/4 with chest pain, which radiates to arm. Similar sx had also promoted ED presentation on 2/1 at which time trops were 100. She was seen by cardiology outpt on 2/2 and chest pain was felt to be related to volume shift as pain had occurred during dialysis.  On 2/4 presentation, Pt states that episodic chest pain has been intermittent for the past week. Non-exertional, lasts approx 10 minutes. Associated nausea and SOB. Non-specific EKG changes  & was d/w cardiology, ruled out for STEMI. Trop 200. Placed on [place] for SpO2 80s.Admitted to [facility] for obvs with concern for PE and started on heparin gtt. Awaiting VQ scan. ECHO has been performed which reveals Severe RV RA enlargement, severely reduced RV function and septal bowing, felt consistent with chronic process such as severe pulmonary HTN.   PCCM has been consulted in this setting     Pertinent  Medical History   ESRD PE Pulmonary HTN  CAD CVA HLD HTN S/p MV repair  Pericardial effusion r/t minoxidil  Diastolic HF DM2  Significant Hospital Events: Including procedures, antibiotic start and stop dates in addition to other pertinent events   2/4 ED presentation with recurring chest pain -- STEMI ruled out. Stable, mildly hypoxic and placed on [place] 2/5 admit to [facility] for obvs. Concern for PE, ECHO performed by cardiology reportedly shows RV failure, but felt more consistent with chronic process like severe pulmHTN. Awaiting VQ scan. Pulm consulted   Interim History / Subjective:  Bad pleth, difficult to determine SpO2 but seems to vary between 86 and 96 on RA  Denies SOB   Husband at bedside with pt. Describing feeling  blindsided recently when learning that pt is not a renal tx candidate and did not understand why. Very emotional about this.  Reviewed most recent [facility] Renal Tx team note with pt and family which describes lack of candidacy due to pulmonary HTN, cardiac hx  Objective   Blood pressure 114/84, pulse 92, temperature 98 F (36.7 C), temperature source Oral, resp. rate 17, height 4' 11.5" (1.511 m), weight 77 kg, SpO2 100 %.        Intake/Output Summary (Last 24 hours) at 01/01/2022 7939 Last data filed at 01/15/2022 2115 Gross per 24 hour  Intake 500.16 ml  Output --  Net 500.16 ml   Filed Weights   01/23/2022 1820 01/01/22 0153  Weight: 76.7 kg 77 kg    Examination: General: chronically ill older adult F in recliner NAD  HENT: NCAT redundant neck tissue anicteric sclear Lungs: diminished, clear. Even and unlabored  Cardiovascular: rr cap refill < 3 sec  Abdomen: obese soft ndnt  Extremities: no acute deformity. No pitting extremity edema  Neuro: AAOx4 following commands GU: defer   Resolved Hospital Problem list     Assessment & Plan:   Acute respiratory failure with hypoxia - improving Pulmonary HTN Concern for PE  Dyspnea in setting of above  -given episodic chest pain, SOB, hx PE, there is concern for possible PE now. On home eliquis for prior PE  ECHO findings with RA RV dilation, RV failure felt more c/w chronic process but difficult to distinguish -Trop is 200  and has been stable on repeat checks P -VQ is pending  -formal ECHO is pending  -Will check a BNP, LA, ddimer  -cont hep gtt  -needs med rec of home sildenafil, opsumit-- she is not sure if she still takes these  -education RE eliquis compliance -- she is not sure if she if taking home eliquis as prescribed  RBBB  Prolonged qtc Non-obstructive CAD S/p MV repair Chronic diastolic heart failure  ESRD HLD DM2 HTN Hx PE  Chronic anticoagulation -present on admission -per primary   Best Practice (right  click and "Reselect all SmartList Selections" daily)   Per primary    Labs   CBC: Recent Labs  Lab 12/28/21 1101 01/18/2022 1853  WBC 4.1 3.8*  HGB 11.2* 10.7*  HCT 35.6* 34.9*  MCV 85.4 87.0  PLT 191 130*    Basic Metabolic Panel: Recent Labs  Lab 12/28/21 1125 01/19/2022 1853 01/01/22 0625  NA 138 137 137  K 3.0* 3.2* 3.8  CL 93* 91* 95*  CO2 33* 32 24  GLUCOSE 96 119* 112*  BUN 15 29* 30*  CREATININE 4.54* 7.25* 7.94*  CALCIUM 9.4 9.0 8.8*  MG  --   --  2.3   GFR: Estimated Creatinine Clearance: 6.3 mL/min (A) (by C-G formula based on SCr of 7.94 mg/dL (H)). Recent Labs  Lab 12/28/21 1101 01/10/2022 1853  WBC 4.1 3.8*  LATICACIDVEN 1.5  --     Liver Function Tests: No results for input(s): AST, ALT, ALKPHOS, BILITOT, PROT, ALBUMIN in the last 168 hours. No results for input(s): LIPASE, AMYLASE in the last 168 hours. No results for input(s): AMMONIA in the last 168 hours.  ABG    Component Value Date/Time   PHART 7.415 07/08/2015 2240   PCO2ART 33.9 (L) 07/08/2015 2240   PO2ART 167.0 (H) 07/08/2015 2240   HCO3 21.7 07/08/2015 2240   TCO2 34 (H) 08/16/2017 0630   ACIDBASEDEF 2.0 07/08/2015 2240   O2SAT 100.0 07/08/2015 2240     Coagulation Profile: No results for input(s): INR, PROTIME in the last 168 hours.  Cardiac Enzymes: No results for input(s): CKTOTAL, CKMB, CKMBINDEX, TROPONINI in the last 168 hours.  HbA1C: Hgb A1c MFr Bld  Date/Time Value Ref Range Status  12/08/2021 11:10 AM 6.1 (H) <5.7 % of total Hgb Final    Comment:    For someone without known diabetes, a hemoglobin  A1c value between 5.7% and 6.4% is consistent with prediabetes and should be confirmed with a  follow-up test. . For someone with known diabetes, a value <7% indicates that their diabetes is well controlled. A1c targets should be individualized based on duration of diabetes, age, comorbid conditions, and other considerations. . This assay result is consistent  with an increased risk of diabetes. . Currently, no consensus exists regarding use of hemoglobin A1c for diagnosis of diabetes for children. Marland Kitchen   04/21/2021 04:14 PM 5.0 <5.7 % of total Hgb Final    Comment:    For the purpose of screening for the presence of diabetes: . <5.7%       Consistent with the absence of diabetes 5.7-6.4%    Consistent with increased risk for diabetes             (prediabetes) > or =6.5%  Consistent with diabetes . This assay result is consistent with a decreased risk of diabetes. . Currently, no consensus exists regarding use of hemoglobin A1c for diagnosis of diabetes in children. . According to American Diabetes Association (  ADA) guidelines, hemoglobin A1c <7.0% represents optimal control in non-pregnant diabetic patients. Different metrics may apply to specific patient populations.  Standards of Medical Care in Diabetes(ADA). .     CBG: Recent Labs  Lab 01/01/22 0744  GLUCAP 116*    Review of Systems:   Review of Systems  Constitutional: Negative.   HENT: Negative.    Eyes: Negative.   Respiratory:  Positive for shortness of breath.   Cardiovascular:  Positive for chest pain and orthopnea.  Gastrointestinal: Negative.   Genitourinary: Negative.   Musculoskeletal: Negative.   Skin: Negative.   Neurological: Negative.   Endo/Heme/Allergies: Negative.   Psychiatric/Behavioral: Negative.      Past Medical History:  She,  has a past medical history of Anemia, Chronic diastolic CHF (congestive heart failure) ([place]), Chronic diastolic heart failure ([place]), CKD (chronic kidney disease) stage 4, GFR 15-29 ml/min (HCC), Constipation, CVA (cerebral infarction) (1997), Diverticulitis, DM (diabetes mellitus) ([place]), Heart murmur, History of cardiovascular stress test, HLD (hyperlipidemia), Hypertension, Pericardial effusion, Pulmonary hypertension ([place]) (06/18/2015), Respiratory failure, acute hypoxic, post-operative (07/08/2015), S/P cardiac  catheterization, S/P minimally invasive mitral valve repair (07/08/2015), Severe mitral regurgitation, Shortness of breath dyspnea, Stroke ([place]) (1997), Vitamin D deficiency, and Wears glasses.   Surgical History:   Past Surgical History:  Procedure Laterality Date   A/V FISTULAGRAM Right 08/16/2017   Procedure: A/V Fistulagram;  Surgeon: Conrad [place], MD;  Location: [place] [facility];  Service: Cardiovascular;  Laterality: Right;   AV FISTULA PLACEMENT Left 10/22/2015   Procedure: RADIOCEPHALIC ARTERIOVENOUS (AV) FISTULA CREATION;  Surgeon: Serafina Mitchell, MD;  Location: [place] OR;  Service: Vascular;  Laterality: Left;   AV FISTULA PLACEMENT Right 05/22/2017   Procedure: ARTERIOVENOUS (AV) FISTULA CREATION-RIGHT ARM;  Surgeon: Waynetta Sandy, MD;  Location: [place];  Service: Vascular;  Laterality: Right;   CANNULATION FOR CARDIOPULMONARY BYPASS N/A 07/08/2015   Procedure: CANNULATION FOR ECMO;  Surgeon: Rexene Alberts, MD;  Location: [place];  Service: Open Heart Surgery;  Laterality: N/A;   CARDIAC CATHETERIZATION     CARDIAC CATHETERIZATION N/A 06/18/2015   Procedure: Right/Left Heart Cath and Coronary Angiography;  Surgeon: Jettie Booze, MD;  Location: [place] [facility];  Service: Cardiovascular;  Laterality: N/A;   CESAREAN SECTION     x 2   COLONOSCOPY     EXCHANGE OF A DIALYSIS CATHETER N/A 06/28/2017   Procedure: EXCHANGE OF A DIALYSIS CATHETER;  Surgeon: Waynetta Sandy, MD;  Location: [place];  Service: Vascular;  Laterality: N/A;   FISTULA SUPERFICIALIZATION Left 12/28/2015   Procedure: SUPERFICIALIZATION LEFT RADIOCEPHALIC FISTULA;  Surgeon: Serafina Mitchell, MD;  Location: [place];  Service: Vascular;  Laterality: Left;   FISTULA SUPERFICIALIZATION Right 08/21/2017   Procedure: FISTULA SUPERFICIALIZATION RIGHT ARM;  Surgeon: Waynetta Sandy, MD;  Location: [place];  Service: Vascular;  Laterality: Right;   INSERTION OF DIALYSIS CATHETER Right 04/28/2017    Procedure: INSERTION OF DIALYSIS CATHETER-RIGHT INTERNAL JUGULAR PLACEMENT;  Surgeon: Elam Dutch, MD;  Location: [place];  Service: Vascular;  Laterality: Right;   LIGATION OF ARTERIOVENOUS  FISTULA Left 04/28/2017   Procedure: LIGATION OF ARTERIOVENOUS  FISTULA;  Surgeon: Elam Dutch, MD;  Location: [place];  Service: Vascular;  Laterality: Left;   MITRAL VALVE REPAIR Right 07/08/2015   Procedure: MINIMALLY INVASIVE MITRAL VALVE REPAIR with a 26 Sorin Memo 3D Rechord;  Surgeon: Rexene Alberts, MD;  Location: [place];  Service: Open Heart Surgery;  Laterality: Right;  MULTIPLE EXTRACTIONS WITH ALVEOLOPLASTY N/A 06/30/2015   Procedure: MULTIPLE EXTRACTIONS OF TOOTH #'S 4  AND 30 WITH ALVEOLOPLASTY AND GROSS DEBRIDEMENT  OF REMAINING TEETH;  Surgeon: Lenn Cal, DDS;  Location: [place];  Service: Oral Surgery;  Laterality: N/A;   PERIPHERAL VASCULAR CATHETERIZATION Left 03/28/2016   Procedure: A/V Fistulagram;  Surgeon: Serafina Mitchell, MD;  Location: [place] [facility];  Service: Cardiovascular;  Laterality: Left;  lower arm   TEE WITHOUT CARDIOVERSION N/A 06/08/2015   Procedure: TRANSESOPHAGEAL ECHOCARDIOGRAM (TEE);  Surgeon: Lelon Perla, MD;  Location[facility];  Service: Cardiovascular;  Laterality: N/A;   TEE WITHOUT CARDIOVERSION N/A 07/08/2015   Procedure: TRANSESOPHAGEAL ECHOCARDIOGRAM (TEE);  Surgeon: Rexene Alberts, MD;  Location: [place];  Service: Open Heart Surgery;  Laterality: N/A;     Social History:   reports that she has never smoked. She has never used smokeless tobacco. She reports that she does not drink alcohol and does not use drugs.   Family History:  Her family history includes Breast cancer in her sister; Cancer in her sister; Heart attack in her brother; Heart disease in her brother; Stroke in her mother.   Allergies Allergies  Allergen Reactions   Minoxidil Other (See Comments)    Pericardial effusion   Lipitor [Atorvastatin] Other (See Comments)     MYALGIAS > "pain in legs" Tolerates rosuvastatin   Morphine And Related Itching   Ace Inhibitors Other (See Comments)    REACTION: "not sure...think it made me drowsy all the time"     Home Medications  Prior to Admission medications   Medication Sig Start Date End Date Taking? Authorizing Provider  acetaminophen (TYLENOL) 500 MG tablet Take 500 mg by mouth as needed (for pain.).    [provider]  apixaban (ELIQUIS) 5 MG TABS tablet Take 5 mg by mouth 2 (two) times daily.    [provider]  aspirin EC 81 MG tablet Take 81 mg by mouth daily.    [provider]  azelastine (ASTELIN) 0.1 % nasal spray Use 1 to 2 sprays each nostril 2 to 3 x / day 03/26/18   Unk Pinto, MD  azelastine (ASTELIN) 0.1 % nasal spray Place into the nose. Patient not taking: Reported on 12/29/2021 08/31/21   [provider]  carvedilol (COREG) 25 MG tablet Take 25 mg by mouth daily.    [provider]  cinacalcet (SENSIPAR) 60 MG tablet Take 1 tablet (60 mg total) by mouth daily. Patient taking differently: Take 60 mg by mouth See admin instructions. Takes at dialysis M/W/F 09/11/17   Unk Pinto, MD  Doxercalciferol (HECTOROL IV) Doxercalciferol (Hectorol) 12/07/21 12/04/22  [provider]  Doxercalciferol (HECTOROL IV) Doxercalciferol (Hectorol) 12/21/21 12/20/22  [provider]  hydrALAZINE (APRESOLINE) 50 MG tablet Take 1 tablet (50 mg total) by mouth 3 (three) times daily. FOR BLOOD  PRESSURE AND HEART 11/09/20   Lelon Perla, MD  latanoprost (XALATAN) 0.005 % ophthalmic solution Place 1 drop into both eyes at bedtime. 09/16/18   [provider]  losartan (COZAAR) 50 MG tablet Take 1 tablet Daily for BP Patient taking differently: Take 50 mg by mouth daily. 03/19/20   Unk Pinto, MD  macitentan (OPSUMIT) 10 MG tablet Take 10 mg by mouth daily.    [provider]  Methoxy PEG-Epoetin Beta (MIRCERA IJ) Mircera 12/07/21  12/06/22  [provider]  nitroGLYCERIN (NITROSTAT) 0.4 MG SL tablet Place under the tongue. 08/24/21   [provider]  ondansetron (ZOFRAN ODT) 4 MG disintegrating tablet Take 1 tablet (4 mg total) by mouth every 8 (eight) hours as needed for nausea or vomiting. 02/23/19   Nils Flack, Mina A, PA-C  pantoprazole (PROTONIX) 40 MG tablet Take 1 tablet (40 mg total) by mouth daily. 08/29/21 11/24/21  Barb Merino, MD  RENVELA 800 MG tablet Take 1,600 mg by mouth See admin instructions. 1600 mg (2 caps) with every meal 12/21/15   [provider]  rosuvastatin (CRESTOR) 20 MG tablet Take 1 tablet Daily for Cholesterol Patient taking differently: Take 20 mg by mouth at bedtime. 03/19/20   Unk Pinto, MD  sertraline (ZOLOFT) 25 MG tablet Take 1 tablet Daily for Mood Patient taking differently: Take 25 mg by mouth daily. 03/19/20   Unk Pinto, MD  sildenafil (REVATIO) 20 MG tablet Take 2 tablets (40 mg) 3 x  /day fr Pulmonary Hypertension Patient taking differently: Take 40 mg by mouth 3 (three) times daily. 03/26/20   Unk Pinto, MD     Critical care time: n/a       Eliseo Gum MSN, AGACNP-BC [place] for pager  01/01/2022, 9:29 AM   PCCM:  I have reviewed and agree with documentation above by Bowser, NP.   66 yo FM, ESRD on HD. I suspect she has chronic RV failure by exam and history. Of course PE is not ruled out completely. VQ has been ordered.   BP 114/84    Pulse 92    Temp 98 F (36.7 C) (Oral)    Resp 17    Ht 4' 11.5" (1.511 m)    Wt 77 kg    SpO2 100%    BMI 33.71 kg/m   Gen: obese fm, resting in chair  HENT: Lilly in place, NCAT  Heart: RRR, S2>S1  Lungs: no crackles Abd: obese, soft   Labs reviewed   ECHO:   1. Left ventricular ejection fraction, by estimation, is 50 to 55%. The  left ventricle has low normal function. The left ventricle has no regional  wall motion abnormalities. There is moderate  left ventricular hypertrophy.  Left ventricular diastolic  parameters are indeterminate.   2. Right ventricular systolic function is mildly reduced. The right  ventricular size is mildly enlarged. Mildly increased right ventricular  wall thickness. There is moderately elevated pulmonary artery systolic  pressure. The estimated right ventricular  systolic pressure is 92.1 mmHg.   3. Left atrial size was mild to moderately dilated.   4. Right atrial size was mildly dilated.   5. The mitral valve has been repaired/replaced. Mild mitral valve  regurgitation. The mean mitral valve gradient is 9.0 mmHg. There is a 26  mm Sorin Memo 3D Rechord present in the mitral position. Suggestive of at  least moderately stenotic mitral valve.   6. Tricuspid valve regurgitation is moderate.   7. The aortic valve is tricuspid. Aortic valve regurgitation is not  visualized. No aortic stenosis is present. Aortic valve mean gradient  measures 4.0 mmHg.   8. The inferior vena cava is normal in size with <50% respiratory  variability, suggesting right atrial pressure of 8 mmHg.   RHC: Pressures    RA 8 mmHg/6 mmHg/6 mmHg  RV 52 mmHg/3 mmHg/8 mmHg  PA 50 mmHg/23 mmHg/32 mmHg  PCW 25 mmHg/18 mmHg/21 mmHg  AO 110 mmHg/71 mmHg/84 mmHg  BP 125/79   Saturations    SVC Saturation O2-Sat 69  PA Saturation O2-Sat 68  AO Saturation  O2-Sat 99   Estimated Blood Loss: 2 ml   Mean Arterial Pressure (MAP): 84 mm Hg  Cardiac Output (CO) Fick:  5.8 l/min  Cardiac Index (CI) Fick:  3.2 l/min/m2  Cardiac Output (CO) Thermodilution:  3.97 l/min  Cardiac Index (CI) Thermodilution:  2.33 l/min/m2  Transpulmonary Pressure Gradient (TPG) in mmHg:  11 mm Hg  PVR: 1.9 by Fick, 2.7 by TD    A:  Pulmonary Hypertension? AV Fistula Possible high output from fistula to Right heart Last RHC with normal PVR and wedge of   P: Consider doppler flow study to fistula or discuss with nephro  Needs continued volume management  with HD  Keep plan for VQ  If perfusion neg would stop heparin   Outpatient follow up with cardiology at [facility]   This patient is critically ill with multiple organ system failure; which, requires frequent high complexity decision making, assessment, support, evaluation, and titration of therapies. This was completed through the application of advanced monitoring technologies and extensive interpretation of multiple databases. During this encounter critical care time was devoted to patient care services described in this note for 32 minutes.   Garner Nash, DO [place] [facility] 01/01/2022 10:19 AM

## 2022-01-01 NOTE — Progress Notes (Signed)
Progress Note   Patient: Jody Nguyen:[id number] DOB: 12/17/1955 DOA: 12/30/2021     0 DOS: the patient was seen and examined on 01/01/2022   Brief hospital course: ADDALIE Nguyen is a 66 year old female with past medical history significant for pulmonary hypertension followed by cardiology at [facility], chronic diastolic congestive heart failure, previous MV repair, nonobstructive CAD per cath done in 2016, hypertension, hyperlipidemia, ESRD on HD MWF, CVA, type 2 diabetes, history of PE on Eliquis who presented to [facility] ED on 2/4 with complaint of persistent chest pain. Patient states she is having episodes of chest pain this past week.  Reports nonexertional pain all across her chest with radiation to the back and throbbing in nature.  Each episode lasts about 10 minutes.  She has had episodes of chest pain after dialysis but having it even on nondialysis days.  Associated with nausea and dyspnea.  Denies history of blood clots. Last dialysis session was on Friday and she still makes urine.  She was seen in the ED on 12/28/2021 for chest pain and troponins were elevated at 100 but flat and consistent with previous.  She was slightly hypotensive and given fluid bolus.  EKG with no acute changes.  Seen by cardiology on 12/29/2021 and it was felt that her chest pain and troponin elevation were due to sudden fluid volume shift as chest pain occurred during dialysis.  Plan was to monitor her symptoms and discuss settings with HD prior to pursuing invasive cardiac catheterization.  In the ED, WBC 3.8 (slightly low on recent labs as well), hemoglobin 10.7 (stable), platelet count 130k.  Sodium 137, potassium 3.2, chloride 91, bicarb 32, BUN 29, creatinine 7.2, glucose 119.  EKG showing sinus rhythm, new LAFB and RBBB, QTC prolongation. High-sensitivity troponin elevated but stable (210 > 201). COVID and flu negative.  Chest x-ray showing no active disease. While in the ED, patient had an  episode of sinus tachycardia with rate in the 130s to 140s and became hypotensive with systolic in the 35T. 500 cc fluid bolus was given after which blood pressure improved and heart rate normalized.  ED physician discussed the case with cardiology, recommended admission for observation. TRH consulted.  Assessment and Plan: * Chest pain- (present on admission) Patient represented to the ED with persistent chest pain.  Seen 4 days prior with noted troponins elevated at 100 but flat and consistent with previous.  Cardiology at that time felt her symptoms were related to set fluid volume shifts during dialysis; however, she has continued to experience chest pain even on nondialysis days associated with dyspnea. EKG with new LAFB and RBBB. Cardiac catheterization done in 2016 showing nonobstructive CAD.  Stress test done in October 2022 was intermediate risk.  Cardiology was consulted by ED who recommended admission for observation.  Patient did develop transient episode of tachycardia, hypotension and respiratory failure that resolved with IV fluid bolus and she was titrated off of oxygen. --High-sensitivity troponin elevated 210 > 201    --Given hypotension, tachycardia and likely medication noncompliance, concern for PE; nuclear medicine VQ scan: Pending --TTE: Pending --Heparin drip (Eliquis outpatient) --Continue to trend troponin --Monitor on telemetry  QT prolongation- (present on admission) EKG with QTC 556.   --Avoid QT prolonging medications --Monitor on telemetry  History of stroke --Aspirin 81 mg p.o. daily, Crestor 20 mg p.o. nightly  ESRD on dialysis [facility]) Follows with nephrology outpatient, [name].  Dialyzes at [facility] on a MWF  schedule.  Reports full session on Friday. --Will consult nephrology tomorrow if remains inpatient for continued HD  Chronic diastolic heart failure [name] (present on admission) Stable.  No signs of volume overload. -- TTE:  Pending  Pulmonary hypertension [name]- (present on admission) --macitentan 10mg  PO daily --Revatio 40mg  PO TID  Obesity (BMI 30.0-34.9)- (present on admission) Body mass index is 33.71 kg/m. Discussed with patient needs for aggressive lifestyle changes/weight loss as this complicates all facets of care.  Outpatient follow-up with PCP.    Type 2 diabetes mellitus [name] Diet controlled at home. --sensitive SSI for coverage --CBGs qAC/HS  Hyperlipidemia associated with type 2 diabetes mellitus [name]- (present on admission) --Crestor 20 mg p.o. nightly  Essential hypertension- (present on admission) She had an episode of hypotension in the ED which resolved after a 500 cc fluid bolus.  Blood pressure currently stable.  On carvedilol 12.5 mg p.o. twice daily, losartan 50 mg p.o. daily, Hydralazine 50mg  PO TID at home. --Holding home antihypertensives currently due to transient hypotension on admission --Hydralazine 25mg  PO q8h PRN SBP >180 or SBP >110 --Continue monitor BP closely        Subjective:  Patient seen examined at bedside, resting comfortably.  Sitting in bedside chair.  Spouse present.  Currently chest pain-free.  Discussed plan for VQ scan to rule out pulmonary embolism and update echocardiogram.  No other specific complaints or concerns at this time.  Denies headache, no dizziness, no fever/chills/night sweats, no nausea/vomiting/diarrhea, no current chest pain, no palpitations, no shortness of breath, no abdominal pain, no weakness, no fatigue, no paresthesias.  No acute concerns this morning per nursing staff.  Physical Exam: Vitals:   01/01/22 0030 01/01/22 0100 01/01/22 0153 01/01/22 0402  BP: 113/69 125/80 107/83 114/84  Pulse: 94 (!) 104 92 92  Resp:  (!) 25 (!) 25 17  Temp:   98 F (36.7 C) 98 F (36.7 C)  TempSrc:   Oral Oral  SpO2: 100% 93% 91% 100%  Weight:   77 kg   Height:   4' 11.5" (1.511 m)    Physical Exam GEN: 66 yo female in NAD, alert and  oriented x 3, obese, appears older than stated age HEENT: NCAT, PERRL, EOMI, sclera clear, MMM PULM: CTAB w/o wheezes/crackles, normal respiratory effort, on room air CV: RRR w/o M/G/R GI: abd soft, NTND, NABS, no R/G/M MSK: no peripheral edema, muscle strength globally intact 5/5 bilateral upper/lower extremities NEURO: CN II-XII intact, no focal deficits, sensation to light touch intact PSYCH: normal mood/affect Integumentary: dry/intact, no rashes or wounds   Data Reviewed:  Personally reviewed CBC, BMP, troponin, EKG, chest x-ray  Family Communication: Stated spouse present at bedside this morning  Disposition: Status is: Observation The patient remains OBS appropriate and will d/c before 2 midnights.        Planned Discharge Destination: Home     Time spent: 48 minutes spent on chart review, discussion with nursing staff, consultants, updating family and interview/physical exam; more than 50% of that time was spent in counseling and/or coordination of care.  Author: Bettyanne Dittman J [place] DO 01/01/2022 11:51 AM  For on call review [email or web address].

## 2022-01-01 NOTE — Assessment & Plan Note (Addendum)
Continue telemetry monitoring

## 2022-01-01 NOTE — Hospital Course (Addendum)
Jody Nguyen is a 66 year old female with past medical history significant for pulmonary hypertension followed by cardiology at [facility] health, chronic diastolic congestive heart failure, previous MV repair, nonobstructive CAD per cath done in 2016, hypertension, hyperlipidemia, ESRD on HD MWF, CVA, type 2 diabetes, history of PE on Eliquis who presented to [facility] ED on 2/4 with complaint of persistent chest pain. Patient states she is having episodes of chest pain this past week.  Reports nonexertional pain all across her chest with radiation to the back and throbbing in nature.  Each episode lasts about 10 minutes.  She has had episodes of chest pain after dialysis but having it even on nondialysis days.  Associated with nausea and dyspnea.  Denies history of blood clots. Last dialysis session was on Friday and she still makes urine.  She was seen in the ED on 12/28/2021 for chest pain and troponins were elevated at 100 but flat and consistent with previous.  She was slightly hypotensive and given fluid bolus.  EKG with no acute changes.  Seen by cardiology on 12/29/2021 and it was felt that her chest pain and troponin elevation were due to sudden fluid volume shift as chest pain occurred during dialysis.  Plan was to monitor her symptoms and discuss settings with HD prior to pursuing invasive cardiac catheterization.  In the ED, WBC 3.8 (slightly low on recent labs as well), hemoglobin 10.7 (stable), platelet count 130k.  Sodium 137, potassium 3.2, chloride 91, bicarb 32, BUN 29, creatinine 7.2, glucose 119.  EKG showing sinus rhythm, new LAFB and RBBB, QTC prolongation. High-sensitivity troponin elevated but stable (210 > 201). COVID and flu negative.  Chest x-ray showing no active disease. While in the ED, patient had an episode of sinus tachycardia with rate in the 130s to 140s and became hypotensive with systolic in the 53U. 500 cc fluid bolus was given after which blood pressure improved and  heart rate normalized.  ED physician discussed the case with cardiology, recommended admission for observation. TRH consulted.  She is found to have severe coronary calcification and multivessel CAD.  Recommendation was to obtain a surgical consultation for consideration for CABG and cardiothoracic surgery feels that she is high risk but she would likely benefit from a CABG.  Plan is for surgical intervention on Friday, 2/10/202.  Post CABG she suffered multiple complications including; persistent vasoplegic shock requiring weeks for pressor support, GNR bacteremia seen 2/17 (completed 7 days of ABT but became febrile again 3/5 and ABT resumed), renal failure requiring CRRT initiation (now on iHD and TDC placed 3/9), anemia/thrombocytopenia requiring transfusions, and ICU delirium She has now been pressor free since 3/6 and is stable for transfer out of ICU.  03/09 focal neurologic deficit with left sided weakness and right gaze preference, new from prior days.  CT with no acute changes, neurology was consulted and recommended brain MRI.   Transfer to [facility] 02/03/22  03/11 with rectal bleeding.

## 2022-01-01 NOTE — Assessment & Plan Note (Addendum)
Continue oxymetry monitoring, holding on pulmonary hypertension therapy for now.  Patient has history of pulmonary embolism, because acute bleeding will hold on anticoagulation for now.

## 2022-01-01 NOTE — Assessment & Plan Note (Addendum)
-  C/w Rousvastatin 20 mg p.o. nightly

## 2022-01-01 NOTE — Assessment & Plan Note (Addendum)
-  Complicates overall prognosis and care -Estimated body mass index is 35.55 kg/m as calculated from the following:   Height as of this encounter: 4' 11.5" (1.511 m).   Weight as of this encounter: 81.2 kg.  -Weight Loss and Dietary Counseling given and discussed with patient needs for aggressive lifestyle changes/weight loss as this complicates all facets of care.   -Will need Outpatient follow-up with PCP.

## 2022-01-01 NOTE — Progress Notes (Signed)
Pharmacy Consult for Pulmonary Hypertension Treatment   Indication - Continuation of prior to admission medication   Patient is 66 y.o.  with history of PAH on chronic Macitentan (Opsumit) PTA and will be continued while hospitalized.   Continuing this medication order as an inpatient requires that monitoring parameters per REMS requirements must be met.  Chronic therapy is under the supervision of Kelkar, Kavin Leech, MD at [facility] who is enrolled in the REMS program and is being notified of continuation of therapy. Made attempt to call office and leave voicemail, but unable to leave voicemail at this time. Will attempt to call again tomorrow. Per patient report has previously been educated on Pregnancy Risk and Hepatotoxicity . On admission pregnancy risk has been assessed and no monitoring required - patient is not of reproductive age. Hepatic function has been evaluated. LFTs ordered for today and pending. Most recent LFTs from 12/08/21 within normal limits.  Hepatic Function Latest Ref Rng & Units 12/08/2021 11/28/2021 08/28/2021  Total Protein 6.1 - 8.1 g/dL 7.5 6.9 7.6  Albumin 3.5 - 5.0 g/dL - 3.6 3.7  AST 10 - 35 U/L 16 19 11(L)  ALT 6 - 29 U/L _0 Alk Phosphatase 38 - 126 U/L - 88 76  Total Bilirubin 0.2 - 1.2 mg/dL 0.6 0.9 0.6  Bilirubin, Direct 0.0 - 0.2 mg/dL - - <0.1    If any question arise or pregnancy is identified during hospitalization, contact for bosentan and macitentan: [phone]; ambrisentan: [phone].  Thank for you allowing [place] to participate in the care of this patient.   Zenaida Deed, PharmD PGY1 Acute Care Pharmacy Resident  Phone: [phone] 01/01/2022  12:45 PM  Please check [email or web address] for unit-specific pharmacy phone numbers.

## 2022-01-01 NOTE — Assessment & Plan Note (Addendum)
Patient on tube feedings.  Fasting glucose this am 214.  Continue glucose cover and monitoring with insulin sliding scale and basal insulin 12 units bid

## 2022-01-01 NOTE — Assessment & Plan Note (Addendum)
Hyponatremia,. Continue renal replacement therapy per nephrology recommendations   Today Na is 132 and K at 3,8 and serum bicarbonate 25.

## 2022-01-01 NOTE — H&P (Signed)
History and Physical    Patient: Jody Nguyen HEN:[id number] DOB: [birth year] DOA: 12/29/2021 DOS: the patient was seen and examined on 01/01/2022 PCP: Jody Pinto, MD  Patient coming from:  [place] ED  Chief Complaint:  Chief Complaint  Patient presents with   Chest Pain   Arm Pain    HPI: Jody Nguyen is a 66 y.o. female with medical history significant of pulmonary hypertension followed by cardiology at [facility], chronic diastolic heart failure, previous MV repair, nonobstructive CAD per cath done in 2016, hypertension, hyperlipidemia, ESRD on HD MWF, CVA, type 2 diabetes, history of PE on Eliquis.  She was seen in the ED on 12/28/2021 for chest pain and troponins were elevated at 100 but flat and consistent with previous.  She was slightly hypotensive and given fluid bolus.  EKG with no acute changes.  Seen by cardiology on 12/29/2021 and it was felt that her chest pain and troponin elevation were due to sudden fluid volume shift as chest pain occurred during dialysis.  Plan was to monitor her symptoms and discuss settings with HD prior to pursuing invasive cardiac catheterization.  She returned to the ED yesterday complaining of recurrent chest pain.  Vital signs stable on arrival.  Labs showing WBC 3.8 (slightly low on recent labs as well), hemoglobin 10.7 (stable), platelet count 130k.  Sodium 137, potassium 3.2, chloride 91, bicarb 32, BUN 29, creatinine 7.2, glucose 119.  EKG showing sinus rhythm, new LAFB and RBBB, QTC prolongation.  High-sensitivity troponin elevated but stable (210 > 201). COVID and flu negative.  Chest x-ray showing no active disease. While in the ED, patient had an episode of sinus tachycardia with rate in the 130s to 140s and became hypotensive with systolic in the 36R.  500 cc fluid bolus was given after which blood pressure improved and heart rate normalized.  ED physician discussed the case with cardiology, recommended admission for observation.  Patient states she is  having episodes of chest pain this past week.  Reports nonexertional pain all across her chest with radiation to the back and throbbing in nature.  Each episode lasts about 10 minutes.  She has had episodes of chest pain after dialysis but having it even on nondialysis days.  Associated with nausea and dyspnea.  Denies history of blood clots.  Last dialysis session was on Friday and she still makes urine.  Review of Systems: As mentioned in the history of present illness. All other systems reviewed and are negative. Past Medical History:  Diagnosis Date   Anemia    Chronic diastolic CHF (congestive heart failure) (HCC)    Chronic diastolic heart failure (HCC)    CKD (chronic kidney disease) stage 4, GFR 15-29 ml/min (HCC)    dialysis M/W/F   Constipation    CVA (cerebral infarction) 1997   no residual deficit   Diverticulitis    DM (diabetes mellitus) (HCC)    type 2   Heart murmur    History of cardiovascular stress test    a. Myoview Oct 2012 showed EF 49%, no ischemia, LVE   HLD (hyperlipidemia)    takes Crestor daily   Hypertension    Pericardial effusion    chronic; felt to be poss related to minoxidil >> DC'd   Pulmonary hypertension ([place]) 06/18/2015   Respiratory failure, acute hypoxic, post-operative 07/08/2015   Requiring ECMO support   S/P cardiac catheterization    a. R/L HC 06/18/15:  mLAD 30%; severe pulmo HTN with PA sat 43%, CI  1.86, prominent V waves indicative of MR; resting hypoxemia O2 sat 86% on RA   S/P minimally invasive mitral valve repair 07/08/2015   Complex valvuloplasty including artificial Gore-tex neochord placement x10 and 26 mm Sorin Memo 3D Rechord ring annuloplasty via right mini thoracotomy approach   Severe mitral regurgitation    Shortness of breath dyspnea    Stroke ([place]) 1997   no residual effect   Vitamin D deficiency    Wears glasses    Past Surgical History:  Procedure Laterality Date   A/V FISTULAGRAM Right 08/16/2017   Procedure: A/V  Fistulagram;  Surgeon: Conrad Hemby Bridge, MD;  Location: [place] [facility];  Service: Cardiovascular;  Laterality: Right;   AV FISTULA PLACEMENT Left 10/22/2015   Procedure: RADIOCEPHALIC ARTERIOVENOUS (AV) FISTULA CREATION;  Surgeon: Serafina Mitchell, MD;  Location: [place] OR;  Service: Vascular;  Laterality: Left;   AV FISTULA PLACEMENT Right 05/22/2017   Procedure: ARTERIOVENOUS (AV) FISTULA CREATION-RIGHT ARM;  Surgeon: Waynetta Sandy, MD;  Location: [place];  Service: Vascular;  Laterality: Right;   CANNULATION FOR CARDIOPULMONARY BYPASS N/A 07/08/2015   Procedure: CANNULATION FOR ECMO;  Surgeon: Rexene Alberts, MD;  Location: [place];  Service: Open Heart Surgery;  Laterality: N/A;   CARDIAC CATHETERIZATION     CARDIAC CATHETERIZATION N/A 06/18/2015   Procedure: Right/Left Heart Cath and Coronary Angiography;  Surgeon: Jettie Booze, MD;  Location: [place] CV [facility];  Service: Cardiovascular;  Laterality: N/A;   CESAREAN SECTION     x 2   COLONOSCOPY     EXCHANGE OF A DIALYSIS CATHETER N/A 06/28/2017   Procedure: EXCHANGE OF A DIALYSIS CATHETER;  Surgeon: Waynetta Sandy, MD;  Location: [place];  Service: Vascular;  Laterality: N/A;   FISTULA SUPERFICIALIZATION Left 12/28/2015   Procedure: SUPERFICIALIZATION LEFT RADIOCEPHALIC FISTULA;  Surgeon: Serafina Mitchell, MD;  Location: [place];  Service: Vascular;  Laterality: Left;   FISTULA SUPERFICIALIZATION Right 08/21/2017   Procedure: FISTULA SUPERFICIALIZATION RIGHT ARM;  Surgeon: Waynetta Sandy, MD;  Location: [place];  Service: Vascular;  Laterality: Right;   INSERTION OF DIALYSIS CATHETER Right 04/28/2017   Procedure: INSERTION OF DIALYSIS CATHETER-RIGHT INTERNAL JUGULAR PLACEMENT;  Surgeon: Elam Dutch, MD;  Location: [place];  Service: Vascular;  Laterality: Right;   LIGATION OF ARTERIOVENOUS  FISTULA Left 04/28/2017   Procedure: LIGATION OF ARTERIOVENOUS  FISTULA;  Surgeon: Elam Dutch, MD;  Location: [place];   Service: Vascular;  Laterality: Left;   MITRAL VALVE REPAIR Right 07/08/2015   Procedure: MINIMALLY INVASIVE MITRAL VALVE REPAIR with a 26 Sorin Memo 3D Rechord;  Surgeon: Rexene Alberts, MD;  Location: [place];  Service: Open Heart Surgery;  Laterality: Right;   MULTIPLE EXTRACTIONS WITH ALVEOLOPLASTY N/A 06/30/2015   Procedure: MULTIPLE EXTRACTIONS OF TOOTH #'S 4  AND 30 WITH ALVEOLOPLASTY AND GROSS DEBRIDEMENT  OF REMAINING TEETH;  Surgeon: Lenn Cal, DDS;  Location: [place];  Service: Oral Surgery;  Laterality: N/A;   PERIPHERAL VASCULAR CATHETERIZATION Left 03/28/2016   Procedure: A/V Fistulagram;  Surgeon: Serafina Mitchell, MD;  Location: [place] CV [facility];  Service: Cardiovascular;  Laterality: Left;  lower arm   TEE WITHOUT CARDIOVERSION N/A 06/08/2015   Procedure: TRANSESOPHAGEAL ECHOCARDIOGRAM (TEE);  Surgeon: Lelon Perla, MD;  [facility];  Service: Cardiovascular;  Laterality: N/A;   TEE WITHOUT CARDIOVERSION N/A 07/08/2015   Procedure: TRANSESOPHAGEAL ECHOCARDIOGRAM (TEE);  Surgeon: Rexene Alberts, MD;  Location: [place];  Service:  Open Heart Surgery;  Laterality: N/A;   Social History:  reports that she has never smoked. She has never used smokeless tobacco. She reports that she does not drink alcohol and does not use drugs.  Allergies  Allergen Reactions   Minoxidil Other (See Comments)    Pericardial effusion   Lipitor [Atorvastatin] Other (See Comments)    MYALGIAS > "pain in legs" Tolerates rosuvastatin   Morphine And Related Itching   Ace Inhibitors Other (See Comments)    REACTION: "not sure...think it made me drowsy all the time"    Family History  Problem Relation Age of Onset   Cancer Sister        breast cancer   Stroke Mother    Heart attack Brother        MI in his 6s   Heart disease Brother        before age 7   Breast cancer Sister     Prior to Admission medications   Medication Sig Start Date End Date Taking? Authorizing Provider   acetaminophen (TYLENOL) 500 MG tablet Take 500 mg by mouth as needed (for pain.).    [provider]  apixaban (ELIQUIS) 5 MG TABS tablet Take 5 mg by mouth 2 (two) times daily.    [provider]  aspirin EC 81 MG tablet Take 81 mg by mouth daily.    [provider]  azelastine (ASTELIN) 0.1 % nasal spray Use 1 to 2 sprays each nostril 2 to 3 x / day 03/26/18   Jody Pinto, MD  azelastine (ASTELIN) 0.1 % nasal spray Place into the nose. Patient not taking: Reported on 12/29/2021 08/31/21   [provider]  carvedilol (COREG) 25 MG tablet Take 25 mg by mouth daily.    [provider]  cinacalcet (SENSIPAR) 60 MG tablet Take 1 tablet (60 mg total) by mouth daily. Patient taking differently: Take 60 mg by mouth See admin instructions. Takes at dialysis M/W/F 09/11/17   Jody Pinto, MD  Doxercalciferol (HECTOROL IV) Doxercalciferol (Hectorol) 12/07/21 12/04/22  [provider]  Doxercalciferol (HECTOROL IV) Doxercalciferol (Hectorol) 12/21/21 12/20/22  [provider]  hydrALAZINE (APRESOLINE) 50 MG tablet Take 1 tablet (50 mg total) by mouth 3 (three) times daily. FOR BLOOD  PRESSURE AND HEART 11/09/20   Lelon Perla, MD  latanoprost (XALATAN) 0.005 % ophthalmic solution Place 1 drop into both eyes at bedtime. 09/16/18   [provider]  losartan (COZAAR) 50 MG tablet Take 1 tablet Daily for BP Patient taking differently: Take 50 mg by mouth daily. 03/19/20   Jody Pinto, MD  macitentan (OPSUMIT) 10 MG tablet Take 10 mg by mouth daily.    [provider]  Methoxy PEG-Epoetin Beta (MIRCERA IJ) Mircera 12/07/21 12/06/22  [provider]  nitroGLYCERIN (NITROSTAT) 0.4 MG SL tablet Place under the tongue. 08/24/21   [provider]  ondansetron (ZOFRAN ODT) 4 MG disintegrating tablet Take 1 tablet (4 mg total) by mouth every 8 (eight) hours as needed for nausea or vomiting. 02/23/19   Rodell Perna A,  PA-C  pantoprazole (PROTONIX) 40 MG tablet Take 1 tablet (40 mg total) by mouth daily. 08/29/21 11/24/21  Barb Merino, MD  RENVELA 800 MG tablet Take 1,600 mg by mouth See admin instructions. 1600 mg (2 caps) with every meal 12/21/15   [provider]  rosuvastatin (CRESTOR) 20 MG tablet Take 1 tablet Daily for Cholesterol Patient taking differently: Take 20 mg by mouth at bedtime. 03/19/20  Jody Pinto, MD  sertraline (ZOLOFT) 25 MG tablet Take 1 tablet Daily for Mood Patient taking differently: Take 25 mg by mouth daily. 03/19/20   Jody Pinto, MD  sildenafil (REVATIO) 20 MG tablet Take 2 tablets (40 mg) 3 x  /day fr Pulmonary Hypertension Patient taking differently: Take 40 mg by mouth 3 (three) times daily. 03/26/20   Jody Pinto, MD    Physical Exam: Vitals:   01/01/22 0030 01/01/22 0100 01/01/22 0153 01/01/22 0402  BP: 113/69 125/80 107/83 114/84  Pulse: 94 (!) 104 92 92  Resp:  (!) 25 (!) 25 17  Temp:   98 F (36.7 C) 98 F (36.7 C)  TempSrc:   Oral Oral  SpO2: 100% 93% 91% 100%  Weight:   77 kg   Height:   4' 11.5" (1.511 m)    Physical Exam Constitutional:      General: She is not in acute distress. HENT:     Head: Normocephalic and atraumatic.  Eyes:     Extraocular Movements: Extraocular movements intact.     Conjunctiva/sclera: Conjunctivae normal.  Cardiovascular:     Rate and Rhythm: Normal rate and regular rhythm.     Pulses: Normal pulses.  Pulmonary:     Effort: Pulmonary effort is normal. No respiratory distress.     Breath sounds: Normal breath sounds. No wheezing or rales.  Abdominal:     General: Bowel sounds are normal. There is no distension.     Palpations: Abdomen is soft.     Tenderness: There is no abdominal tenderness.  Musculoskeletal:        General: No swelling or tenderness.     Cervical back: Normal range of motion and neck supple.  Skin:    General: Skin is warm and dry.  Neurological:     General: No focal  deficit present.     Mental Status: She is alert and oriented to person, place, and time.     Data Reviewed:  Labs, imaging, EKG reviewed (see HPI).  Assessment and Plan: * Chest pain- (present on admission) Acute hypoxic respiratory failure Seen in the ED 4 days ago for the same problem.  Troponins at that time were elevated at 100 but flat and consistent with previous. Cardiology felt at that time that her symptoms were related to sudden fluid volume shift during dialysis.  However, she has continued to experience chest pain even on nondialysis days.  Associated with dyspnea.  EKG done in the ED yesterday showing new LAFB and RBBB. High-sensitivity troponin elevated but stable (210 > 201).  Cardiac catheterization done in 2016 showing nonobstructive CAD.  Stress test done in October 2022 was intermediate risk.  Cardiology recommended admission for observation.  PE is also on the differential given transient episode of tachycardia and hypotension.  Informed by RN that upon arrival to the hospital, patient became tachycardic to the 130s when moving from stretcher to the bed.  She had increased work of breathing and her sats dropped to mid 80s.  She was placed on 4 L supplemental oxygen and currently stable.  Not tachycardic at rest.  Not hypotensive at present.  Chest x-ray showing no acute finding and lungs clear on exam.  She is on Eliquis due to history of prior PE, ?compliance vs Eliquis failure.  Given tachycardia, hypoxia, transient hypotension, and new right bundle branch block, I am concerned about possible recurrent PE. -Cardiac monitoring, trend troponin, routine echocardiogram ordered.  Although she is a dialysis patient, patient  tells me she still makes urine daily.  Avoiding CTA chest at this time.  Discussed with the patient, start IV heparin per PE protocol and obtain VQ scan in the morning.  Since routine echocardiogram will not be done until morning, I spoke to Dr. Toney Rakes from  cardiology and requested bedside echocardiogram to assess for signs of right heart strain to make sure this is not a massive PE.  Cardiology will perform bedside echo, appreciate help.  Type 2 diabetes mellitus (HCC) -Sliding scale insulin very sensitive ACHS.  QT prolongation- (present on admission) QT prolongation -Cardiac monitoring.  Hypokalemia is only mild and she is ESRD on HD.  Check magnesium level and replace if low.  Avoid QT prolonging drugs if possible.  Repeat EKG in AM.  History of stroke -Continue aspirin and statin after pharmacy med rec is done.  ESRD on dialysis ([place]) No indication for urgent dialysis overnight. -Consult nephrology in the morning  Chronic diastolic heart failure ([place])- (present on admission) Stable.  No signs of volume overload. -Repeat echocardiogram ordered given EKG changes/chest pain.  Hyperlipidemia associated with type 2 diabetes mellitus ([place])- (present on admission) -Continue statin after pharmacy med rec is done  Essential hypertension- (present on admission) She had an episode of hypotension in the ED which resolved after a 500 cc fluid bolus.  Blood pressure currently stable. -Avoid antihypertensives at this time.   Advance Care Planning:   Code Status: Full Code.  Discussed with the patient.  Consults: Cardiology  Family Communication: Husband at bedside but sleeping.  Severity of Illness: The appropriate patient status for this patient is OBSERVATION. Observation status is judged to be reasonable and necessary in order to provide the required intensity of service to ensure the patient's safety. The patient's presenting symptoms, physical exam findings, and initial radiographic and laboratory data in the context of their medical condition is felt to place them at decreased risk for further clinical deterioration. Furthermore, it is anticipated that the patient will be medically stable for discharge from the hospital within 2 midnights  of admission.   Author: Shela Leff, MD 01/01/2022 5:38 AM  For on call review [email or web address].

## 2022-01-01 NOTE — Consult Note (Signed)
[facility] Renal Consultation Note    Indication for Consultation:  Management of ESRD/hemodialysis, anemia, hypertension/volume, and secondary hyperparathyroidism. PCP:  HPI: Jody Nguyen is a 66 y.o. female with ESRD, T2DM, HTN, Hx CVA, Hx MVR, Hx pulmonary HTN, and non-obstructive CAD (2016) who was admitted with recurrent, episodic chest pain.  Presented to ED on 2/4 with intermittent episodes of CP radiating to the back over the past week, usually last ~10 minutes with occ dyspnea. Was in ED on 2/1 with same symptoms which started after dialysis, she was given 511mL bolus back and symptoms resolved over time. Labs were stable except troponin was 100 (chronically elevated, felt due to ESRD). She saw her OP cardiology office on 2/2. Plan was close monitoring with plan to pursue LHC if needed. Pain recurred 2/3 overnight with radiation to her back and down both arms. Vials were stable. Labs with K 3.2, Trop 210 -> 201 -> 194. EKG showed RRR, but with new LAFB and RBBB.   She was admitted OBS status for work-up, looks like she has an ECHO and VQ scan pending. K today is improved, 3.8. LA slightly up at 2. Flu/COVID negative. Today - she reports no chest pain but has not been out of bed yet.  Dialyzes on MWF schedule at [facility] - due for HD tomorrow.  Past Medical History:  Diagnosis Date   Anemia    Chronic diastolic CHF (congestive heart failure) (HCC)    Chronic diastolic heart failure (HCC)    CKD (chronic kidney disease) stage 4, GFR 15-29 ml/min (HCC)    dialysis M/W/F   Constipation    CVA (cerebral infarction) 1997   no residual deficit   Diverticulitis    DM (diabetes mellitus) (HCC)    type 2   Heart murmur    History of cardiovascular stress test    a. Myoview Oct 2012 showed EF 49%, no ischemia, LVE   HLD (hyperlipidemia)    takes Crestor daily   Hypertension    Pericardial effusion    chronic; felt to be poss related to minoxidil >> DC'd   Pulmonary  hypertension ([place]) 06/18/2015   Respiratory failure, acute hypoxic, post-operative 07/08/2015   Requiring ECMO support   S/P cardiac catheterization    a. R/L [facility] 06/18/15:  mLAD 30%; severe pulmo HTN with PA sat 43%, CI 1.86, prominent V waves indicative of MR; resting hypoxemia O2 sat 86% on RA   S/P minimally invasive mitral valve repair 07/08/2015   Complex valvuloplasty including artificial Gore-tex neochord placement x10 and 26 mm Sorin Memo 3D Rechord ring annuloplasty via right mini thoracotomy approach   Severe mitral regurgitation    Shortness of breath dyspnea    Stroke ([place]) 1997   no residual effect   Vitamin D deficiency    Wears glasses    Past Surgical History:  Procedure Laterality Date   A/V FISTULAGRAM Right 08/16/2017   Procedure: A/V Fistulagram;  Surgeon: Conrad Redway, MD;  Location: [place] CV LAB;  Service: Cardiovascular;  Laterality: Right;   AV FISTULA PLACEMENT Left 10/22/2015   Procedure: RADIOCEPHALIC ARTERIOVENOUS (AV) FISTULA CREATION;  Surgeon: Serafina Mitchell, MD;  Location: [place] OR;  Service: Vascular;  Laterality: Left;   AV FISTULA PLACEMENT Right 05/22/2017   Procedure: ARTERIOVENOUS (AV) FISTULA CREATION-RIGHT ARM;  Surgeon: Waynetta Sandy, MD;  Location: [place];  Service: Vascular;  Laterality: Right;   CANNULATION FOR CARDIOPULMONARY BYPASS N/A 07/08/2015   Procedure: CANNULATION FOR ECMO;  Surgeon: Rexene Alberts, MD;  Location: [place];  Service: Open Heart Surgery;  Laterality: N/A;   CARDIAC CATHETERIZATION     CARDIAC CATHETERIZATION N/A 06/18/2015   Procedure: Right/Left Heart Cath and Coronary Angiography;  Surgeon: Jettie Booze, MD;  Location: [place] CV LAB;  Service: Cardiovascular;  Laterality: N/A;   CESAREAN SECTION     x 2   COLONOSCOPY     EXCHANGE OF A DIALYSIS CATHETER N/A 06/28/2017   Procedure: EXCHANGE OF A DIALYSIS CATHETER;  Surgeon: Waynetta Sandy, MD;  Location: [place];  Service: Vascular;   Laterality: N/A;   FISTULA SUPERFICIALIZATION Left 12/28/2015   Procedure: SUPERFICIALIZATION LEFT RADIOCEPHALIC FISTULA;  Surgeon: Serafina Mitchell, MD;  Location: [place];  Service: Vascular;  Laterality: Left;   FISTULA SUPERFICIALIZATION Right 08/21/2017   Procedure: FISTULA SUPERFICIALIZATION RIGHT ARM;  Surgeon: Waynetta Sandy, MD;  Location: [place];  Service: Vascular;  Laterality: Right;   INSERTION OF DIALYSIS CATHETER Right 04/28/2017   Procedure: INSERTION OF DIALYSIS CATHETER-RIGHT INTERNAL JUGULAR PLACEMENT;  Surgeon: Elam Dutch, MD;  Location: [place];  Service: Vascular;  Laterality: Right;   LIGATION OF ARTERIOVENOUS  FISTULA Left 04/28/2017   Procedure: LIGATION OF ARTERIOVENOUS  FISTULA;  Surgeon: Elam Dutch, MD;  Location: [place];  Service: Vascular;  Laterality: Left;   MITRAL VALVE REPAIR Right 07/08/2015   Procedure: MINIMALLY INVASIVE MITRAL VALVE REPAIR with a 26 Sorin Memo 3D Rechord;  Surgeon: Rexene Alberts, MD;  Location: [place];  Service: Open Heart Surgery;  Laterality: Right;   MULTIPLE EXTRACTIONS WITH ALVEOLOPLASTY N/A 06/30/2015   Procedure: MULTIPLE EXTRACTIONS OF TOOTH #'S 4  AND 30 WITH ALVEOLOPLASTY AND GROSS DEBRIDEMENT  OF REMAINING TEETH;  Surgeon: Lenn Cal, DDS;  Location: [place];  Service: Oral Surgery;  Laterality: N/A;   PERIPHERAL VASCULAR CATHETERIZATION Left 03/28/2016   Procedure: A/V Fistulagram;  Surgeon: Serafina Mitchell, MD;  Location: [place] CV LAB;  Service: Cardiovascular;  Laterality: Left;  lower arm   TEE WITHOUT CARDIOVERSION N/A 06/08/2015   Procedure: TRANSESOPHAGEAL ECHOCARDIOGRAM (TEE);  Surgeon: Lelon Perla, MD;  Location: [facility] END[facility];  Service: Cardiovascular;  Laterality: N/A;   TEE WITHOUT CARDIOVERSION N/A 07/08/2015   Procedure: TRANSESOPHAGEAL ECHOCARDIOGRAM (TEE);  Surgeon: Rexene Alberts, MD;  Location: [place];  Service: Open Heart Surgery;  Laterality: N/A;   Family History  Problem Relation Age of  Onset   Cancer Sister        breast cancer   Stroke Mother    Heart attack Brother        MI in his 46s   Heart disease Brother        before age 34   Breast cancer Sister    Social History:  reports that she has never smoked. She has never used smokeless tobacco. She reports that she does not drink alcohol and does not use drugs.  ROS: As per HPI otherwise negative.  Physical Exam: Vitals:   01/01/22 0030 01/01/22 0100 01/01/22 0153 01/01/22 0402  BP: 113/69 125/80 107/83 114/84  Pulse: 94 (!) 104 92 92  Resp:  (!) 25 (!) 25 17  Temp:   98 F (36.7 C) 98 F (36.7 C)  TempSrc:   Oral Oral  SpO2: 100% 93% 91% 100%  Weight:   77 kg   Height:   4' 11.5" (1.511 m)      General: Well developed, well nourished, in no acute distress.  Room air. Head: Normocephalic, atraumatic, sclera non-icteric, mucus membranes are moist. Neck: Supple without lymphadenopathy/masses. JVD not elevated. Lungs: Clear bilaterally to auscultation without wheezes, rales, or rhonchi. Breathing is unlabored. Heart: RRR with normal S1, S2. No murmurs, rubs, or gallops appreciated. Abdomen: Soft, non-tender, non-distended with normoactive bowel sounds.  Musculoskeletal:  Strength and tone appear normal for age. Lower extremities: No edema or ischemic changes, no open wounds. Neuro: Alert and oriented X 3. Moves all extremities spontaneously. Psych:  Responds to questions appropriately with a normal affect. Dialysis Access: R forearm AVF + bruit  Allergies  Allergen Reactions   Minoxidil Other (See Comments)    Pericardial effusion   Lipitor [Atorvastatin] Other (See Comments)    MYALGIAS > "pain in legs" Tolerates rosuvastatin   Morphine And Related Itching   Ace Inhibitors Other (See Comments)    REACTION: "not sure...think it made me drowsy all the time"   Prior to Admission medications   Medication Sig Start Date End Date Taking? Authorizing Provider  acetaminophen (TYLENOL) 500 MG tablet Take  500 mg by mouth as needed (for pain.).   Yes [provider]  apixaban (ELIQUIS) 5 MG TABS tablet Take 5 mg by mouth 2 (two) times daily.   Yes [provider]  aspirin EC 81 MG tablet Take 81 mg by mouth daily.   Yes [provider]  carvedilol (COREG) 25 MG tablet Take 12.5 mg by mouth 2 (two) times daily.   Yes [provider]  cinacalcet (SENSIPAR) 60 MG tablet Take 1 tablet (60 mg total) by mouth daily. Patient taking differently: Take 60 mg by mouth See admin instructions. Takes at dialysis M/W/F 09/11/17  Yes Unk Pinto, MD  hydrALAZINE (APRESOLINE) 50 MG tablet Take 1 tablet (50 mg total) by mouth 3 (three) times daily. FOR BLOOD  PRESSURE AND HEART 11/09/20  Yes Lelon Perla, MD  latanoprost (XALATAN) 0.005 % ophthalmic solution Place 1 drop into both eyes at bedtime. 09/16/18  Yes [provider]  losartan (COZAAR) 50 MG tablet Take 1 tablet Daily for BP Patient taking differently: Take 50 mg by mouth daily. 03/19/20  Yes Unk Pinto, MD  macitentan (OPSUMIT) 10 MG tablet Take 10 mg by mouth daily.   Yes [provider]  nitroGLYCERIN (NITROSTAT) 0.4 MG SL tablet Place 0.4 mg under the tongue every 5 (five) minutes x 3 doses as needed for chest pain. 08/24/21  Yes [provider]  ondansetron (ZOFRAN ODT) 4 MG disintegrating tablet Take 1 tablet (4 mg total) by mouth every 8 (eight) hours as needed for nausea or vomiting. 02/23/19  Yes Fawze, Mina A, PA-C  pantoprazole (PROTONIX) 40 MG tablet Take 1 tablet (40 mg total) by mouth daily. Patient taking differently: Take 40 mg by mouth daily as needed (GERD). 08/29/21 01/01/23 Yes Barb Merino, MD  RENVELA 800 MG tablet Take 1,600-2,400 mg by mouth 3 (three) times daily with meals. 12/21/15  Yes [provider]  rosuvastatin (CRESTOR) 20 MG tablet Take 1 tablet Daily for Cholesterol Patient taking differently: Take 20 mg by mouth at bedtime. 03/19/20  Yes  Unk Pinto, MD  sertraline (ZOLOFT) 25 MG tablet Take 1 tablet Daily for Mood Patient taking differently: Take 25 mg by mouth daily. 03/19/20  Yes Unk Pinto, MD  sevelamer carbonate (RENVELA) 2.4 g PACK Take 2.4 g by mouth 3 (three) times daily with meals.   Yes [provider]  sildenafil (REVATIO) 20 MG tablet Take 2 tablets (40 mg)  3 x  /day fr Pulmonary Hypertension Patient taking differently: Take 40 mg by mouth 3 (three) times daily. 03/26/20  Yes Unk Pinto, MD  azelastine (ASTELIN) 0.1 % nasal spray Use 1 to 2 sprays each nostril 2 to 3 x / day Patient not taking: Reported on 01/01/2022 03/26/18   Unk Pinto, MD   Current Facility-Administered Medications  Medication Dose Route Frequency Provider Last Rate Last Admin   acetaminophen (TYLENOL) tablet 650 mg  650 mg Oral Q6H PRN Shela Leff, MD       Or   acetaminophen (TYLENOL) suppository 650 mg  650 mg Rectal Q6H PRN Shela Leff, MD       aspirin EC tablet 81 mg  81 mg Oral Daily [place] Eric J, DO       heparin ADULT infusion 100 units/mL (25000 units/243mL)  1,100 Units/hr Intravenous Continuous Shela Leff, MD 11 mL/hr at 01/01/22 0539 1,100 Units/hr at 01/01/22 0539   hydrALAZINE (APRESOLINE) tablet 25 mg  25 mg Oral Q8H PRN [place] Eric J, DO       insulin aspart (novoLOG) injection 0-5 Units  0-5 Units Subcutaneous QHS Shela Leff, MD       insulin aspart (novoLOG) injection 0-6 Units  0-6 Units Subcutaneous TID WC Shela Leff, MD   1 Units at 01/01/22 1157   latanoprost (XALATAN) 0.005 % ophthalmic solution 1 drop  1 drop Both Eyes QHS [place] Eric J, DO       macitentan (OPSUMIT) tablet 10 mg  10 mg Oral Daily [place] Eric J, DO       macitentan (OPSUMIT) tablet 10 mg  10 mg Oral Daily [place] Eric J, DO       pantoprazole (PROTONIX) EC tablet 40 mg  40 mg Oral Daily PRN [place] Eric J, DO       rosuvastatin (CRESTOR) tablet 20 mg  20 mg Oral QHS [place]  Eric J, DO       sildenafil (REVATIO) tablet 40 mg  40 mg Oral TID [place] Eric J, [place]       Labs: Basic Metabolic Panel: Recent Labs  Lab 12/28/21 1125 01/05/2022 1853 01/01/22 0625  NA 138 137 137  K 3.0* 3.2* 3.8  CL 93* 91* 95*  CO2 33* 32 24  GLUCOSE 96 119* 112*  BUN 15 29* 30*  CREATININE 4.54* 7.25* 7.94*  CALCIUM 9.4 9.0 8.8*   CBC: Recent Labs  Lab 12/28/21 1101 01/22/2022 1853  WBC 4.1 3.8*  HGB 11.2* 10.7*  HCT 35.6* 34.9*  MCV 85.4 87.0  PLT 191 130*    CBG: Recent Labs  Lab 01/01/22 0744 01/01/22 1121  GLUCAP 116* 165*   Studies/Results: DG Chest Port 1 View  Result Date: 01/12/2022 CLINICAL DATA:  Generalized chest pain and shortness of breath since yesterday. EXAM: PORTABLE CHEST 1 VIEW COMPARISON:  Chest x-rays dated 12/28/2021 and 12/20/2021. FINDINGS: Stable cardiomegaly. Lungs are clear. No pleural effusion or pneumothorax is seen. Osseous structures about the chest are unremarkable. IMPRESSION: 1. No active disease. No evidence of pneumonia or pulmonary edema. 2. Stable cardiomegaly. Electronically Signed   By: Franki Cabot M.D.   On: 01/04/2022 19:07    Dialysis Orders:  MWF at [facility] 3:15hr, 450/A1.5, EDW 75.5kg, 2K/2Ca, UFP #2, AVF, heparin 2500 unit bolus - Hectoral 28mcg IV q HD - Sensipar 90mg  PO q HD - Mircera 119mcg IV q 2 weeks (ordered, not yet given. Last Mircera 100 on 12/07/21)  Assessment/Plan:  Intermittent chest pain with radiation  to back/dyspnea: Trops elevated, but trend is stable. Echo and VQ scan pending. On heparin drip. Work-up/plan per primary team.  ESRD:  Continue HD on MWF schedule - next tomorrow, will go ahead and add her to the schedule. K has been low, change to 3K bath tomorrow.  Hypertension/volume: BP controlled on current regimen.  Anemia: Hgb 10.7 - wait on ESA for now.  Metabolic bone disease: Ca ok, Phos pending. Continue home binders, VDRA, sensipar.  Pulm HTN: Followed at [facility], on Revatio  T2DM  Veneta Penton, Hershal Coria 01/01/2022, 12:31 PM  [facility]

## 2022-01-01 NOTE — Progress Notes (Signed)
ANTICOAGULATION CONSULT NOTE - Initial Consult  Pharmacy Consult for heparin Indication:  r/o acute PE  Allergies  Allergen Reactions   Minoxidil Other (See Comments)    Pericardial effusion   Lipitor [Atorvastatin] Other (See Comments)    MYALGIAS > "pain in legs" Tolerates rosuvastatin   Morphine And Related Itching   Ace Inhibitors Other (See Comments)    REACTION: "not sure...think it made me drowsy all the time"    Patient Measurements: Height: 4' 11.5" (151.1 cm) Weight: 77 kg (169 lb 12.1 oz) IBW/kg (Calculated) : 44.35 Heparin Dosing Weight: 60kg  Vital Signs: Temp: 98 F (36.7 C) (02/05 0402) Temp Source: Oral (02/05 0402) BP: 114/84 (02/05 0402) Pulse Rate: 92 (02/05 0402)  Labs: Recent Labs    01/08/2022 1853 01/12/2022 2052  HGB 10.7*  --   HCT 34.9*  --   PLT 130*  --   CREATININE 7.25*  --   TROPONINIHS 210* 201*    Estimated Creatinine Clearance: 6.9 mL/min (A) (by C-G formula based on SCr of 7.25 mg/dL (H)).   Medical History: Past Medical History:  Diagnosis Date   Anemia    Chronic diastolic CHF (congestive heart failure) (HCC)    Chronic diastolic heart failure (HCC)    CKD (chronic kidney disease) stage 4, GFR 15-29 ml/min (HCC)    dialysis M/W/F   Constipation    CVA (cerebral infarction) 1997   no residual deficit   Diverticulitis    DM (diabetes mellitus) (HCC)    type 2   Heart murmur    History of cardiovascular stress test    a. Myoview Oct 2012 showed EF 49%, no ischemia, LVE   HLD (hyperlipidemia)    takes Crestor daily   Hypertension    Pericardial effusion    chronic; felt to be poss related to minoxidil >> DC'd   Pulmonary hypertension ([place]) 06/18/2015   Respiratory failure, acute hypoxic, post-operative 07/08/2015   Requiring ECMO support   S/P cardiac catheterization    a. R/L [facility] 06/18/15:  mLAD 30%; severe pulmo HTN with PA sat 43%, CI 1.86, prominent V waves indicative of MR; resting hypoxemia O2 sat 86% on RA   S/P  minimally invasive mitral valve repair 07/08/2015   Complex valvuloplasty including artificial Gore-tex neochord placement x10 and 26 mm Sorin Memo 3D Rechord ring annuloplasty via right mini thoracotomy approach   Severe mitral regurgitation    Shortness of breath dyspnea    Stroke ([place]) 1997   no residual effect   Vitamin D deficiency    Wears glasses     Medications:  Medications Prior to Admission  Medication Sig Dispense Refill Last Dose   acetaminophen (TYLENOL) 500 MG tablet Take 500 mg by mouth as needed (for pain.).      apixaban (ELIQUIS) 5 MG TABS tablet Take 5 mg by mouth 2 (two) times daily.      aspirin EC 81 MG tablet Take 81 mg by mouth daily.      azelastine (ASTELIN) 0.1 % nasal spray Use 1 to 2 sprays each nostril 2 to 3 x / day 90 mL 3    azelastine (ASTELIN) 0.1 % nasal spray Place into the nose. (Patient not taking: Reported on 12/29/2021)      carvedilol (COREG) 25 MG tablet Take 25 mg by mouth daily.      cinacalcet (SENSIPAR) 60 MG tablet Take 1 tablet (60 mg total) by mouth daily. (Patient taking differently: Take 60 mg by mouth See admin  instructions. Takes at dialysis M/W/F) 90 tablet 1    Doxercalciferol (HECTOROL IV) Doxercalciferol (Hectorol)      Doxercalciferol (HECTOROL IV) Doxercalciferol (Hectorol)      hydrALAZINE (APRESOLINE) 50 MG tablet Take 1 tablet (50 mg total) by mouth 3 (three) times daily. FOR BLOOD  PRESSURE AND HEART 270 tablet 3    latanoprost (XALATAN) 0.005 % ophthalmic solution Place 1 drop into both eyes at bedtime.      losartan (COZAAR) 50 MG tablet Take 1 tablet Daily for BP (Patient taking differently: Take 50 mg by mouth daily.) 90 tablet 1    macitentan (OPSUMIT) 10 MG tablet Take 10 mg by mouth daily.      Methoxy PEG-Epoetin Beta (MIRCERA IJ) Mircera      nitroGLYCERIN (NITROSTAT) 0.4 MG SL tablet Place under the tongue.      ondansetron (ZOFRAN ODT) 4 MG disintegrating tablet Take 1 tablet (4 mg total) by mouth every 8 (eight)  hours as needed for nausea or vomiting. 10 tablet 0    pantoprazole (PROTONIX) 40 MG tablet Take 1 tablet (40 mg total) by mouth daily. 30 tablet 1    RENVELA 800 MG tablet Take 1,600 mg by mouth See admin instructions. 1600 mg (2 caps) with every meal  5    rosuvastatin (CRESTOR) 20 MG tablet Take 1 tablet Daily for Cholesterol (Patient taking differently: Take 20 mg by mouth at bedtime.) 90 tablet 3    sertraline (ZOLOFT) 25 MG tablet Take 1 tablet Daily for Mood (Patient taking differently: Take 25 mg by mouth daily.) 90 tablet 3    sildenafil (REVATIO) 20 MG tablet Take 2 tablets (40 mg) 3 x  /day fr Pulmonary Hypertension (Patient taking differently: Take 40 mg by mouth 3 (three) times daily.) 540 tablet 3     Assessment: 66yo female c/o CP with intermittent SOB, admitted for acute hypoxic respiratory failure, to transition from Eliquis for concern for acute PE given hypoxia and tachycardia, awaiting bedside echo and VQ scan; pt missed Saturday doses of Eliquis, last taken Friday pm.  Goal of Therapy:  Heparin level 0.3-0.7 units/ml aPTT 66-102 seconds Monitor platelets by anticoagulation protocol: Yes   Plan:  Heparin infusion at 1100 units/hr; no bolus given recent Eliquis use. Monitor heparin levels, aPTT (while Eliquis affects anti-Xa lab), and CBC.  Wynona Neat, PharmD, BCPS  01/01/2022,4:50 AM

## 2022-01-01 NOTE — Progress Notes (Signed)
Brief Cardiology note  Bedside ECHO  performed at the request of Dr Wandra Feinstein given concerns for PE and evaluate right heart function. -> Severe RV and RA enlargement, RV function is severely reduced, the septum is bowing towards LV and LA: this is consistent with chronic process- Pulm HTN, I suspect severe -> LV function is grossly around 50% -> IVC is dilated and non collapsable  Echo findings likely indicative of chronic severe pulmonary HTN. McConnel's sign or acute echo findings of PE cannot be distinguished in this scenario. Clinical correlation and further testing CT PE or V/Q scan can be obtained.  Renae Fickle, MD Cardiology coverage

## 2022-01-01 NOTE — Care Management Obs Status (Signed)
[place] NOTIFICATION   Patient Details  Name: Jody Nguyen MRN: [id number] Date of Birth: 24-Aug-1956   Medicare Observation Status Notification Given:  Yes    Zenon Mayo, RN 01/01/2022, 9:54 AM

## 2022-01-01 NOTE — Progress Notes (Signed)
ANTICOAGULATION CONSULT NOTE - Follow Up Consult  Pharmacy Consult for heparin  Indication: r/o acute PE   Allergies  Allergen Reactions   Minoxidil Other (See Comments)    Pericardial effusion   Lipitor [Atorvastatin] Other (See Comments)    MYALGIAS > "pain in legs" Tolerates rosuvastatin   Morphine And Related Itching   Ace Inhibitors Other (See Comments)    REACTION: "not sure...think it made me drowsy all the time"    Patient Measurements: Height: 4' 11.5" (151.1 cm) Weight: 77 kg (169 lb 12.1 oz) IBW/kg (Calculated) : 44.35 Heparin Dosing Weight: 61.9 kg  Vital Signs: Temp: 97.5 F (36.4 C) (02/05 1438) Temp Source: Oral (02/05 1438) BP: 103/74 (02/05 1438) Pulse Rate: 96 (02/05 1438)  Labs: Recent Labs    01/22/2022 1853 01/16/2022 2052 01/01/22 0625 01/01/22 1211 01/01/22 1402  HGB 10.7*  --   --   --   --   HCT 34.9*  --   --   --   --   PLT 130*  --   --   --   --   APTT  --   --   --   --  88*  HEPARINUNFRC  --   --   --   --  0.28*  CREATININE 7.25*  --  7.94*  --   --   TROPONINIHS 210* 201* 194* 189*  --     Estimated Creatinine Clearance: 6.3 mL/min (A) (by C-G formula based on SCr of 7.94 mg/dL (H)).  Assessment: 66yo female c/o CP with intermittent SOB, admitted for acute hypoxic respiratory failure, to transition from Eliquis for concern for acute PE given hypoxia and tachycardia, awaiting bedside echo and VQ scan; pt missed Saturday doses of Eliquis, last taken Friday pm per patient.  First aPTT therapeutic at 88 and heparin level subtherapeutic at 0.28. Since they appear to be correlating, will continue monitoring heparin level only. No issues noted with infusion per RN.   Goal of Therapy:  Heparin level 0.3-0.7 units/ml Monitor platelets by anticoagulation protocol: Yes   Plan:  Increase to heparin 1200 units/hr  Confirmatory heparin level in ~6h  Daily heparin level, CBC Monitor for s/sx of bleeding  Thank you for including pharmacy in  the care of this patient.  Zenaida Deed, PharmD PGY1 Acute Care Pharmacy Resident  Phone: [phone] 01/01/2022  3:18 PM  Please check [email or web address] for unit-specific pharmacy phone numbers.

## 2022-01-01 NOTE — Assessment & Plan Note (Deleted)
Continue with statin therapy and aspirin. Continue neuro checks per unit protocol and follow up with brain MRI

## 2022-01-01 NOTE — Assessment & Plan Note (Addendum)
She had an episode of hypotension in the ED which resolved after a 500 cc fluid bolus.   -Blood pressure currently stable.   -Currently On carvedilol 12.5 mg p.o. twice daily, losartan 50 mg p.o. daily, Hydralazine 50mg  PO TID at home. -Was holding home antihypertensives currently due to transient hypotension on admission but has been started on Metoprolol Tartrate 12.5 mg po BID  -C/wHydralazine 25mg  PO q8h PRN SBP >180 or SBP >110 -Continue monitor BP closely and per protocol -Last BP reading was 91/62

## 2022-01-01 NOTE — Assessment & Plan Note (Addendum)
Underwent left heart catheterization 01/12/2022 with severe coronary calcification and multivessel CAD with LAD 99%, first diagonal 90%, ostial circumflex to proximal circumflex 100%, proximal RCA 100% stenosis.  Now patient is sp CABG and tricuspid valve repair.  Continue with aspirin, ezetimibe and atorvastatin  No chest pain today.  Continue with post operative care, continue to be very weak and deconditioned.

## 2022-01-01 NOTE — Progress Notes (Addendum)
ANTICOAGULATION CONSULT NOTE - Follow Up Consult  Pharmacy Consult for heparin Indication:  Afib and r/o PE  Labs: Recent Labs    01/11/2022 1853 01/02/2022 2052 01/01/22 0625 01/01/22 1211 01/01/22 1402 01/01/22 2230  HGB 10.7*  --   --   --   --   --   HCT 34.9*  --   --   --   --   --   PLT 130*  --   --   --   --   --   APTT  --   --   --   --  88*  --   HEPARINUNFRC  --   --   --   --  0.28* 0.44  CREATININE 7.25*  --  7.94*  --   --   --   TROPONINIHS 210*   < > 194* 189* 207*  --    < > = values in this interval not displayed.    Assessment/Plan:  66yo female therapeutic on heparin after rate change. Will continue infusion at current rate of 1200 units/hr and confirm stable with am labs.   Wynona Neat, PharmD, BCPS  01/01/2022,11:11 PM

## 2022-01-01 NOTE — Progress Notes (Signed)
Update: RN was able to wean oxygen off and currently satting well on room air.  However, concern is that she becomes tachycardic and very dyspneic with minimal movement.  Bedside echo showing severe RV and RA enlargement which cardiology feels is likely chronic and related to underlying pulmonary hypertension.  However, acute echo findings of PE cannot be distinguished in this scenario.  Discussed with PCCM, agree with heparin for anticoagulation and obtaining VQ scan.  If VQ scan is negative, recommending considering obtaining CTA chest given ongoing suspicion for PE or if hemodynamically unstable.  PCCM team will consult.

## 2022-01-01 NOTE — Assessment & Plan Note (Addendum)
TTE with LVEF now 40-45%, LV mildly decreased function, no regional wall motion normalities, moderate LVH, RVSP 39.6, LA moderately dilated, MV repair noted, severe TR, moderate TV stenosis.  Patient has recovered from shock, now off vasopressors. Continue blood pressure support with midodrine  Her blood pressure has been 110 to 120 mmHg.

## 2022-01-01 NOTE — TOC Progression Note (Signed)
Transition of Care [facility]) - Progression Note    Patient Details  Name: Jody Nguyen MRN: [id number] Date of Birth: May 19, 1956  Transition of Care [facility]) CM/SW Contact  Zenon Mayo, RN Phone Number: 01/01/2022, 8:52 AM  Clinical Narrative:     Transition of Care [facility]) Screening Note   Patient Details  Name: Jody Nguyen Date of Birth: 1956/09/21   Transition of Care [facility]) CM/SW Contact:    Zenon Mayo, RN Phone Number: 01/01/2022, 8:52 AM    Transition of Care Department [facility]) has reviewed patient and no TOC needs have been identified at this time. We will continue to monitor patient advancement through interdisciplinary progression rounds. If new patient transition needs arise, please place a TOC consult.          Expected Discharge Plan and Services                                                 Social Determinants of Health (SDOH) Interventions    Readmission Risk Interventions No flowsheet data found.

## 2022-01-02 ENCOUNTER — Inpatient Hospital Stay (HOSPITAL_COMMUNITY): Payer: Medicare Other

## 2022-01-02 ENCOUNTER — Encounter (HOSPITAL_COMMUNITY): Payer: Self-pay | Admitting: Internal Medicine

## 2022-01-02 DIAGNOSIS — R079 Chest pain, unspecified: Secondary | ICD-10-CM | POA: Diagnosis not present

## 2022-01-02 DIAGNOSIS — I5032 Chronic diastolic (congestive) heart failure: Secondary | ICD-10-CM | POA: Diagnosis not present

## 2022-01-02 DIAGNOSIS — N186 End stage renal disease: Secondary | ICD-10-CM | POA: Diagnosis not present

## 2022-01-02 DIAGNOSIS — R072 Precordial pain: Secondary | ICD-10-CM

## 2022-01-02 DIAGNOSIS — I471 Supraventricular tachycardia: Secondary | ICD-10-CM

## 2022-01-02 DIAGNOSIS — I10 Essential (primary) hypertension: Secondary | ICD-10-CM | POA: Diagnosis not present

## 2022-01-02 LAB — HEPARIN LEVEL (UNFRACTIONATED): Heparin Unfractionated: 0.42 IU/mL (ref 0.30–0.70)

## 2022-01-02 LAB — CULTURE, BLOOD (ROUTINE X 2)
Culture: NO GROWTH
Special Requests: ADEQUATE

## 2022-01-02 LAB — RENAL FUNCTION PANEL
Albumin: 3.4 g/dL — ABNORMAL LOW (ref 3.5–5.0)
Anion gap: 18 — ABNORMAL HIGH (ref 5–15)
BUN: 38 mg/dL — ABNORMAL HIGH (ref 8–23)
CO2: 24 mmol/L (ref 22–32)
Calcium: 8.6 mg/dL — ABNORMAL LOW (ref 8.9–10.3)
Chloride: 92 mmol/L — ABNORMAL LOW (ref 98–111)
Creatinine, Ser: 9.15 mg/dL — ABNORMAL HIGH (ref 0.44–1.00)
GFR, Estimated: 4 mL/min — ABNORMAL LOW (ref >60)
Glucose, Bld: 109 mg/dL — ABNORMAL HIGH (ref 70–99)
Phosphorus: 7.5 mg/dL — ABNORMAL HIGH (ref 2.5–4.6)
Potassium: 3.7 mmol/L (ref 3.5–5.1)
Sodium: 134 mmol/L — ABNORMAL LOW (ref 135–145)

## 2022-01-02 LAB — ECHOCARDIOGRAM COMPLETE
AR max vel: 2.22 cm2
AV Peak grad: 5.8 mmHg
Ao pk vel: 1.2 m/s
Area-P 1/2: 3.01 cm2
Calc EF: 48.3 %
Height: 59.5 in
MV VTI: 1.08 cm2
P 1/2 time: 359 msec
S' Lateral: 4.4 cm
Single Plane A2C EF: 48.5 %
Single Plane A4C EF: 48.8 %
Weight: 2716.07 oz

## 2022-01-02 LAB — GLUCOSE, CAPILLARY
Glucose-Capillary: 121 mg/dL — ABNORMAL HIGH (ref 70–99)
Glucose-Capillary: 110 mg/dL — ABNORMAL HIGH (ref 70–99)
Glucose-Capillary: 125 mg/dL — ABNORMAL HIGH (ref 70–99)
Glucose-Capillary: 122 mg/dL — ABNORMAL HIGH (ref 70–99)
Glucose-Capillary: 144 mg/dL — ABNORMAL HIGH (ref 70–99)

## 2022-01-02 LAB — CBC
HCT: 32.7 % — ABNORMAL LOW (ref 36.0–46.0)
Hemoglobin: 10.8 g/dL — ABNORMAL LOW (ref 12.0–15.0)
MCH: 28.1 pg (ref 26.0–34.0)
MCHC: 33 g/dL (ref 30.0–36.0)
MCV: 84.9 fL (ref 80.0–100.0)
Platelets: 118 10*3/uL — ABNORMAL LOW (ref 150–400)
RBC: 3.85 MIL/uL — ABNORMAL LOW (ref 3.87–5.11)
RDW: 21.2 % — ABNORMAL HIGH (ref 11.5–15.5)
WBC: 4.4 10*3/uL (ref 4.0–10.5)
nRBC: 0.7 % — ABNORMAL HIGH (ref 0.0–0.2)

## 2022-01-02 MED ORDER — ASPIRIN 81 MG PO CHEW
81.0000 mg | CHEWABLE_TABLET | ORAL | Status: AC
  Administered 2022-01-03: 81 mg via ORAL
  Filled 2022-01-02: qty 1

## 2022-01-02 MED ORDER — SODIUM CHLORIDE 0.9 % IV SOLN: 250.0000 mL | INTRAVENOUS | Status: DC | PRN

## 2022-01-02 MED ORDER — SODIUM CHLORIDE 0.9 % IV SOLN: INTRAVENOUS | Status: DC

## 2022-01-02 MED ORDER — TECHNETIUM TO 99M ALBUMIN AGGREGATED
4.1000 | Freq: Once | INTRAVENOUS | Status: AC | PRN
  Administered 2022-01-02: 4.1 via INTRAVENOUS

## 2022-01-02 MED ORDER — METOPROLOL TARTRATE 25 MG PO TABS
25.0000 mg | ORAL_TABLET | Freq: Two times a day (BID) | ORAL | Status: DC
  Administered 2022-01-02: 25 mg via ORAL
  Filled 2022-01-02 (×2): qty 1

## 2022-01-02 MED ORDER — SODIUM CHLORIDE 0.9% FLUSH: 3.0000 mL | INTRAVENOUS | Status: DC | PRN

## 2022-01-02 MED ORDER — SODIUM CHLORIDE 0.9% FLUSH
3.0000 mL | Freq: Two times a day (BID) | INTRAVENOUS | Status: DC
  Administered 2022-01-03 – 2022-01-04 (×3): 3 mL via INTRAVENOUS

## 2022-01-02 NOTE — Progress Notes (Signed)
Progress Note   Patient: Jody Nguyen:[id number] DOB: 11-Sep-1956 DOA: 12/28/2021     1 DOS: the patient was seen and examined on 01/02/2022   Brief hospital course: Jody Nguyen is a 66 year old female with past medical history significant for pulmonary hypertension followed by cardiology at [facility], chronic diastolic congestive heart failure, previous MV repair, nonobstructive CAD per cath done in 2016, hypertension, hyperlipidemia, ESRD on HD MWF, CVA, type 2 diabetes, history of PE on Eliquis who presented to [facility] ED on 2/4 with complaint of persistent chest pain. Patient states she is having episodes of chest pain this past week.  Reports nonexertional pain all across her chest with radiation to the back and throbbing in nature.  Each episode lasts about 10 minutes.  She has had episodes of chest pain after dialysis but having it even on nondialysis days.  Associated with nausea and dyspnea.  Denies history of blood clots. Last dialysis session was on Friday and she still makes urine.  She was seen in the ED on 12/28/2021 for chest pain and troponins were elevated at 100 but flat and consistent with previous.  She was slightly hypotensive and given fluid bolus.  EKG with no acute changes.  Seen by cardiology on 12/29/2021 and it was felt that her chest pain and troponin elevation were due to sudden fluid volume shift as chest pain occurred during dialysis.  Plan was to monitor her symptoms and discuss settings with HD prior to pursuing invasive cardiac catheterization.  In the ED, WBC 3.8 (slightly low on recent labs as well), hemoglobin 10.7 (stable), platelet count 130k.  Sodium 137, potassium 3.2, chloride 91, bicarb 32, BUN 29, creatinine 7.2, glucose 119.  EKG showing sinus rhythm, new LAFB and RBBB, QTC prolongation. High-sensitivity troponin elevated but stable (210 > 201). COVID and flu negative.  Chest x-ray showing no active disease. While in the ED, patient had an  episode of sinus tachycardia with rate in the 130s to 140s and became hypotensive with systolic in the 37S. 500 cc fluid bolus was given after which blood pressure improved and heart rate normalized.  ED physician discussed the case with cardiology, recommended admission for observation. TRH consulted.  Assessment and Plan: * Chest pain- (present on admission) Patient represented to the ED with persistent chest pain.  Seen 4 days prior with noted troponins elevated at 100 but flat and consistent with previous.  Cardiology at that time felt her symptoms were related to set fluid volume shifts during dialysis; however, she has continued to experience chest pain even on nondialysis days associated with dyspnea. EKG with new LAFB and RBBB. Cardiac catheterization done in 2016 showing nonobstructive CAD.  Stress test done in October 2022 was intermediate risk.  Cardiology was consulted by ED who recommended admission for observation.  Patient did develop transient episode of tachycardia, hypotension and respiratory failure that resolved with IV fluid bolus and she was titrated off of oxygen. --Cardiology following, appreciate assistance --High-sensitivity troponin elevated 210 > 201 >194 > 189 > 207   --Nuclear medicine VQ scan: Pending --TTE: Pending --Heparin drip (Eliquis outpatient) --Left heart catheterization planned for 2/7; n.p.o. after midnight --Monitor on telemetry  SVT (supraventricular tachycardia) ([place]) Patient now with 2 episodes of SVT during hospitalization, improved with Valsalva maneuvers. --Cardiology following, appreciate assistance --Discontinue carvedilol, starting metoprolol tartrate 25 mg p.o. twice daily --Continue to monitor on telemetry  QT prolongation- (present on admission) EKG with QTC 556.   --Avoid  QT prolonging medications --Monitor on telemetry  History of stroke --Aspirin 81 mg p.o. daily, Crestor 20 mg p.o. nightly  ESRD on dialysis [facility]) Follows with  nephrology outpatient, [name].  Dialyzes at [facility] on a MWF schedule.  Reports full session on Friday. --Will consult nephrology tomorrow if remains inpatient for continued HD  Chronic diastolic heart failure ([place])- (present on admission) Stable.  No signs of volume overload. --TTE: Pending --HD for volume management --Strict I's and O's and daily weights  Pulmonary hypertension ([place])- (present on admission) --macitentan 10mg  PO daily --Revatio 40mg  PO TID  Obesity (BMI 30.0-34.9)- (present on admission) Body mass index is 33.71 kg/m. Discussed with patient needs for aggressive lifestyle changes/weight loss as this complicates all facets of care.  Outpatient follow-up with PCP.    Type 2 diabetes mellitus ([place]) Diet controlled at home. --sensitive SSI for coverage --CBGs qAC/HS  Hyperlipidemia associated with type 2 diabetes mellitus ([place])- (present on admission) --Crestor 20 mg p.o. nightly  Essential hypertension- (present on admission) She had an episode of hypotension in the ED which resolved after a 500 cc fluid bolus.  Blood pressure currently stable.  On carvedilol 12.5 mg p.o. twice daily, losartan 50 mg p.o. daily, Hydralazine 50mg  PO TID at home. --Holding home antihypertensives currently due to transient hypotension on admission --Hydralazine 25mg  PO q8h PRN SBP >180 or SBP >110 --Continue monitor BP closely        Subjective:  Patient seen and examined bedside, resting comfortably.  RN reported recurrent episode of SVT this morning, resolved with Valsalva maneuver.  Seen by cardiology with plan for left heart catheterization tomorrow.  Patient currently with no specific complaints at this time.  Pending HD later today.  Awaiting V/Q and TTE to be performed.  Denies headache, no dizziness, no fever/chills/night sweats, no diarrhea, no current chest pain/palpitations, no shortness of breath, no abdominal pain.  No other acute events overnight per nursing  staff.  Physical Exam: Vitals:   01/01/22 1438 01/01/22 2033 01/02/22 0533 01/02/22 0844  BP: 103/74 93/69 105/75 100/69  Pulse: 96 90 95 93  Resp:  20 19   Temp: (!) 97.5 F (36.4 C) 97.8 F (36.6 C) 98.3 F (36.8 C)   TempSrc: Oral Oral Oral   SpO2: 96% 93% 98% 95%  Weight:      Height:       Physical Exam GEN: 66 yo female in NAD, alert and oriented x 3, obese, appears older than stated age HEENT: NCAT, PERRL, EOMI, sclera clear, MMM PULM: CTAB w/o wheezes/crackles, normal respiratory effort, on 2 L nasal cannula with SPO2 98% at rest CV: RRR w/o M/G/R GI: abd soft, NTND, NABS, no R/G/M MSK: no peripheral edema, muscle strength globally intact 5/5 bilateral upper/lower extremities NEURO: CN II-XII intact, no focal deficits, sensation to light touch intact PSYCH: normal mood/affect Integumentary: dry/intact, no rashes or wounds   Data Reviewed:  Reviewed CBC, BMP, D-dimer personally this morning  Family Communication: No family present at bedside this morning  Disposition: Status is: Inpatient Remains inpatient appropriate because: Pending left heart catheterization tomorrow, pending V/Q and TTE.          Planned Discharge Destination: Home     Time spent: 49 minutes spent on chart review, discussion with nursing staff, consultants, updating family and interview/physical exam; more than 50% of that time was spent in counseling and/or coordination of care.  Author: Terry Abila J [place] DO 01/02/2022 12:47 PM  For on call review  [email or web address]

## 2022-01-02 NOTE — Progress Notes (Signed)
Patient lying in bed complaining of chest pain. EKG obtained showing SVT HR 134. CP 9/10. Pt attempted to bear down with no rhythm or rate changes. MD [place] paged. Pt began feeling nauseated and dry heaved resulting in HR slowing to 94. MD [place] updated.

## 2022-01-02 NOTE — Progress Notes (Signed)
Pt had episode of SVT this am. During episode, HD called to pick up patient, RN asked for a couple minutes to stabilize pt. MD [place] came bedside to see pt. Shortly after, RN called HD to confirm that pt was now ready for transport. RN called again around 1330 and 1730 for time updates. Pt still awaiting transport to HD. Pt and husband updated.

## 2022-01-02 NOTE — Progress Notes (Signed)
Patient's heart rate accelerated to 140s.  Upon assessment, pt reports CP and HA.  Patient in chair requesting to get back in bed.  Patient assisted back to bed.  BP 87/68.  EKG obtained showing SVT.  Patient became nauseated and vomited clear emesis with heart rate dropping to 50 breaking SVT.  Pt now states headache and cp level 0.  Heart rate now 90s sinus rhythm.  BP 96/73.  Dr.  Adrienne Mocha notified with no new orders.  Will continue to monitor patient closely.  Cardiology paged as well.

## 2022-01-02 NOTE — Assessment & Plan Note (Addendum)
Patient now on amiodarone, continue telemetry monitoring.

## 2022-01-02 NOTE — Progress Notes (Signed)
ANTICOAGULATION CONSULT NOTE - Follow Up Consult  Pharmacy Consult for heparin  Indication: r/o acute PE   Allergies  Allergen Reactions   Minoxidil Other (See Comments)    Pericardial effusion   Lipitor [Atorvastatin] Other (See Comments)    MYALGIAS > "pain in legs" Tolerates rosuvastatin   Morphine And Related Itching   Ace Inhibitors Other (See Comments)    REACTION: "not sure...think it made me drowsy all the time"    Patient Measurements: Height: 4' 11.5" (151.1 cm) Weight: 77 kg (169 lb 12.1 oz) IBW/kg (Calculated) : 44.35 Heparin Dosing Weight: 61.9 kg  Vital Signs: Temp: 98.3 F (36.8 C) (02/06 0533) Temp Source: Oral (02/06 0533) BP: 100/69 (02/06 0844) Pulse Rate: 93 (02/06 0844)  Labs: Recent Labs    01/20/2022 1853 01/02/2022 2052 01/01/22 0625 01/01/22 1211 01/01/22 1402 01/01/22 2230 01/02/22 0704  HGB 10.7*  --   --   --   --   --  10.8*  HCT 34.9*  --   --   --   --   --  32.7*  PLT 130*  --   --   --   --   --  118*  APTT  --   --   --   --  88*  --   --   HEPARINUNFRC  --   --   --   --  0.28* 0.44 0.42  CREATININE 7.25*  --  7.94*  --   --   --  9.15*  TROPONINIHS 210*   < > 194* 189* 207*  --   --    < > = values in this interval not displayed.     Estimated Creatinine Clearance: 5.5 mL/min (A) (by C-G formula based on SCr of 9.15 mg/dL (H)).  Assessment: 66yo female c/o CP with intermittent SOB, admitted for acute hypoxic respiratory failure, to transition from Eliquis for concern for acute PE given hypoxia and tachycardia, awaiting bedside echo and VQ scan; pt missed Saturday doses of Eliquis, last taken Friday pm per patient.  Heparin level is therapeutic, CBC stable.  Goal of Therapy:  Heparin level 0.3-0.7 units/ml Monitor platelets by anticoagulation protocol: Yes   Plan:  Continue heparin 1200 units/h Daily heparin level and CBC  Arrie Senate, PharmD, [place], [facility] Clinical Pharmacist [phone] Please check AMION for all [facility]  Pharmacy numbers 01/02/2022

## 2022-01-02 NOTE — Progress Notes (Signed)
[facility] Progress Note   Subjective:   Patient seen and examined in room.  Reports chest pain this AM which has since resolved. Denies SOB, abdominal pain, n/v/d, weakness, dizziness and fatigue.   Objective Vitals:   01/01/22 2033 01/02/22 0533 01/02/22 0844 01/02/22 1322  BP: 93/69 105/75 100/69 90/67  Pulse: 90 95 93 91  Resp: 20 19  20   Temp: 97.8 F (36.6 C) 98.3 F (36.8 C)  (!) 97.5 F (36.4 C)  TempSrc: Oral Oral  Oral  SpO2: 93% 98% 95% 95%  Weight:      Height:       Physical Exam General:well appearing female in NAD Heart:RRR, no mrg Lungs:CTAB, nml WOB on 2L via [place] Abdomen:soft, NTND Extremities:no LE edema Dialysis Access: RU AVF +b/t   Filed Weights   12/29/2021 1820 01/01/22 0153  Weight: 76.7 kg 77 kg    Intake/Output Summary (Last 24 hours) at 01/02/2022 1449 Last data filed at 01/02/2022 0400 Gross per 24 hour  Intake 371.76 ml  Output --  Net 371.76 ml    Additional Objective Labs: Basic Metabolic Panel: Recent Labs  Lab 01/05/2022 1853 01/01/22 0625 01/02/22 0704  NA 137 137 134*  K 3.2* 3.8 3.7  CL 91* 95* 92*  CO2 32 24 24  GLUCOSE 119* 112* 109*  BUN 29* 30* 38*  CREATININE 7.25* 7.94* 9.15*  CALCIUM 9.0 8.8* 8.6*  PHOS  --   --  7.5*   Liver Function Tests: Recent Labs  Lab 01/01/22 1402 01/02/22 0704  AST 23  --   ALT 24  --   ALKPHOS 128*  --   BILITOT 0.8  --   PROT 7.7  --   ALBUMIN 3.8 3.4*    CBC: Recent Labs  Lab 12/28/21 1101 01/24/2022 1853 01/02/22 0704  WBC 4.1 3.8* 4.4  HGB 11.2* 10.7* 10.8*  HCT 35.6* 34.9* 32.7*  MCV 85.4 87.0 84.9  PLT 191 130* 118*   Blood Culture    Component Value Date/Time   SDES  12/28/2021 1101    BLOOD LEFT ARM Performed at [facility], [address], [place], [place] [address]    [facility]  12/28/2021 1101    BOTTLES DRAWN AEROBIC AND ANAEROBIC Blood Culture adequate volume Performed at [facility], [address] [place], [place] [address]    CULT  12/28/2021 1101    NO GROWTH 5 DAYS Performed at [facility], [place] [address] [place], [place] [address]    REPTSTATUS 01/02/2022 FINAL 12/28/2021 1101    CBG: Recent Labs  Lab 01/01/22 1611 01/01/22 2112 01/02/22 0059 01/02/22 0729 01/02/22 1110  GLUCAP 118* 137* 121* 110* 125*   Studies/Results: DG Chest Port 1 View  Result Date: 01/02/2022 CLINICAL DATA:  Preop EXAM: PORTABLE CHEST 1 VIEW COMPARISON:  Chest x-ray dated December 31, 2021 FINDINGS: Unchanged enlarged cardiac and mediastinal contours. Lungs are clear. No large effusion or evidence of pneumothorax. IMPRESSION: Stable cardiomegaly with no evidence of active disease. Electronically Signed   By: Yetta Glassman M.D.   On: 01/02/2022 08:17   DG Chest Port 1 View  Result Date: 01/08/2022 CLINICAL DATA:  Generalized chest pain and shortness of breath since yesterday. EXAM: PORTABLE CHEST 1 VIEW COMPARISON:  Chest x-rays dated 12/28/2021 and 12/20/2021. FINDINGS: Stable cardiomegaly. Lungs are clear. No pleural effusion or pneumothorax is seen. Osseous structures about the chest are unremarkable. IMPRESSION: 1. No active disease. No evidence of pneumonia or pulmonary  edema. 2. Stable cardiomegaly. Electronically Signed   By: Franki Cabot M.D.   On: 01/24/2022 19:07   ECHOCARDIOGRAM COMPLETE  Result Date: 01/02/2022    ECHOCARDIOGRAM REPORT   Patient Name:   GENIYAH EISCHEID Date of Exam: 01/02/2022 Medical Rec #:  [id number]        Height:       59.5 in Accession #:    [id number]       Weight:       169.8 lb Date of Birth:  07-Nov-1956         BSA:          1.730 m Patient Age:    66 years         BP:           103/74 mmHg Patient Gender: F                HR:           91 bpm. Exam Location:  Inpatient Procedure: 2D Echo, Cardiac Doppler and Color Doppler Indications:    Chest pain  History:        Patient has prior history of Echocardiogram examinations. CHF,                  Pulmonary HTN; Risk Factors:Hypertension and Diabetes.                  Mitral Valve: 26 mm prosthetic annuloplasty ring valve is                 present in the mitral position.  Sonographer:    Jyl Heinz Referring Phys: [id number] [place]  1. Left ventricular ejection fraction, by estimation, is 40 to 45%. The left ventricle has mildly decreased function. The left ventricle has no regional wall motion abnormalities. There is moderate left ventricular hypertrophy. Left ventricular diastolic parameters are indeterminate.  2. Right ventricular systolic function is mildly reduced. The right ventricular size is mildly enlarged. There is mildly elevated pulmonary artery systolic pressure. The estimated right ventricular systolic pressure is 01.7 mmHg.  3. Left atrial size was mildly dilated.  4. The mitral valve has been repaired/replaced. No evidence of mitral valve regurgitation. Moderate mitral stenosis. The mean mitral valve gradient is 8.5 mmHg. There is a 26 mm prosthetic annuloplasty ring present in the mitral position.  5. Tricuspid valve regurgitation is severe. Moderate tricuspid stenosis.  6. The aortic valve is normal in structure. Aortic valve regurgitation is not visualized. No aortic stenosis is present.  7. The inferior vena cava is dilated in size with <50% respiratory variability, suggesting right atrial pressure of 15 mmHg. Comparison(s): No significant change from prior study. Prior images reviewed side by side. FINDINGS  Left Ventricle: Left ventricular ejection fraction, by estimation, is 40 to 45%. The left ventricle has mildly decreased function. The left ventricle has no regional wall motion abnormalities. The left ventricular internal cavity size was normal in size. There is moderate left ventricular hypertrophy. Left ventricular diastolic parameters are indeterminate. Right Ventricle: The right ventricular size is mildly enlarged. No increase in right ventricular wall  thickness. Right ventricular systolic function is mildly reduced. There is mildly elevated pulmonary artery systolic pressure. The tricuspid regurgitant  velocity is 2.48 m/s, and with an assumed right atrial pressure of 15 mmHg, the estimated right ventricular systolic pressure is 49.4 mmHg. Left Atrium: Left atrial size was mildly dilated. Right Atrium: Right atrial size was normal in size. Pericardium:  There is no evidence of pericardial effusion. Mitral Valve: The mitral valve has been repaired/replaced. There is severe thickening of the mitral valve leaflet(s). There is severe calcification of the mitral valve leaflet(s). Mildly decreased mobility of the mitral valve leaflets. No evidence of mitral valve regurgitation. There is a 26 mm prosthetic annuloplasty ring present in the mitral position. Moderate mitral valve stenosis. MV peak gradient, 14.7 mmHg. The mean mitral valve gradient is 8.5 mmHg. Tricuspid Valve: The tricuspid valve is normal in structure. Tricuspid valve regurgitation is severe. Moderate tricuspid stenosis. Aortic Valve: The aortic valve is normal in structure. Aortic valve regurgitation is not visualized. Aortic regurgitation PHT measures 359 msec. No aortic stenosis is present. Aortic valve peak gradient measures 5.8 mmHg. Pulmonic Valve: The pulmonic valve was normal in structure. Pulmonic valve regurgitation is trivial. No evidence of pulmonic stenosis. Aorta: The aortic root is normal in size and structure. Venous: The inferior vena cava is dilated in size with less than 50% respiratory variability, suggesting right atrial pressure of 15 mmHg. IAS/Shunts: There is redundancy of the interatrial septum. No atrial level shunt detected by color flow Doppler.  LEFT VENTRICLE PLAX 2D LVIDd:         5.10 cm      Diastology LVIDs:         4.40 cm      LV e' medial:    6.42 cm/s LV PW:         1.70 cm      LV E/e' medial:  24.1 LV IVS:        1.20 cm      LV e' lateral:   7.40 cm/s LVOT diam:      2.00 cm      LV E/e' lateral: 20.9 LV SV:         47 LV SV Index:   27 LVOT Area:     3.14 cm  LV Volumes (MOD) LV vol d, MOD A2C: 147.0 ml LV vol d, MOD A4C: 95.3 ml LV vol s, MOD A2C: 75.7 ml LV vol s, MOD A4C: 48.8 ml LV SV MOD A2C:     71.3 ml LV SV MOD A4C:     95.3 ml LV SV MOD BP:      59.2 ml RIGHT VENTRICLE            IVC RV Basal diam:  4.50 cm    IVC diam: 2.70 cm RV Mid diam:    4.00 cm RV S prime:     5.98 cm/s TAPSE (M-mode): 0.9 cm LEFT ATRIUM           Index        RIGHT ATRIUM           Index LA diam:      4.80 cm 2.77 cm/m   RA Area:     28.90 cm LA Vol (A2C): 61.3 ml 35.43 ml/m  RA Volume:   109.00 ml 63.00 ml/m LA Vol (A4C): 64.3 ml 37.16 ml/m  AORTIC VALVE AV Area (Vmax): 2.22 cm AV Vmax:        120.00 cm/s AV Peak Grad:   5.8 mmHg LVOT Vmax:      84.70 cm/s LVOT Vmean:     58.200 cm/s LVOT VTI:       0.149 m AI PHT:         359 msec  AORTA Ao Root diam: 2.60 cm Ao Asc diam:  3.30 cm MITRAL VALVE  TRICUSPID VALVE MV Area (PHT): 3.01 cm     TR Peak grad:   24.6 mmHg MV Area VTI:   1.08 cm     TR Vmax:        248.00 cm/s MV Peak grad:  14.7 mmHg MV Mean grad:  8.5 mmHg     SHUNTS MV Vmax:       1.92 m/s     Systemic VTI:  0.15 m MV Vmean:      140.0 cm/s   Systemic Diam: 2.00 cm MV Decel Time: 252 msec MV E velocity: 155.00 cm/s MV A velocity: 179.00 cm/s MV E/A ratio:  0.87 Candee Furbish MD Electronically signed by Candee Furbish MD Signature Date/Time: 01/02/2022/2:17:58 PM    Final     Medications:  sodium chloride     [START ON 01/14/2022] sodium chloride     heparin 1,200 Units/hr (01/02/22 0339)    [START ON 01/15/2022] aspirin  81 mg Oral Pre-Cath   aspirin EC  81 mg Oral Daily   cinacalcet  90 mg Oral Once per day on Mon Wed Fri   doxercalciferol  9 mcg Intravenous Q M,W,F-HD   insulin aspart  0-5 Units Subcutaneous QHS   insulin aspart  0-6 Units Subcutaneous TID WC   latanoprost  1 drop Both Eyes QHS   macitentan  10 mg Oral Daily   metoprolol tartrate  25 mg  Oral BID   rosuvastatin  20 mg Oral QHS   sildenafil  40 mg Oral TID   sodium chloride flush  3 mL Intravenous Q12H    Dialysis Orders: MWF at [facility] 3:15hr, 450/A1.5, EDW 75.5kg, 2K/2Ca, UFP #2, AVF, heparin 2500 unit bolus - Hectoral 29mcg IV q HD - Sensipar 90mg  PO q HD - Mircera 174mcg IV q 2 weeks (ordered, not yet given. Last Mircera 100 on 12/07/21)   Assessment/Plan:  Intermittent chest pain with radiation to back/dyspnea: Trops elevated, but trend is stable. Echo and VQ scan pending. Concern for possible PE. On heparin drip. Episodes of SVT, changed to Toprol per cardiology and may require ablation if symptoms not controlled.  Work-up/plan per PMD/cardiology.  ESRD:  Continue HD on MWF schedule - HD today per regular schedule. K has been low, change to 3K bath tomorrow.  Hypertension/volume: BP controlled on current regimen.  Anemia: Hgb 10.7 - hold ESA for now.  Metabolic bone disease: Ca ok, Phos elevated. Continue home binders, VDRA, sensipar. Nutrition - renal diet w/fluid restrictions.   Pulm HTN:  Followed at [facility], on Revatio  T2DM CAD/MVR 2016 - cardiology consulting.   Jen Mow, PA-C [facility] 01/02/2022,2:49 PM  LOS: 1 day

## 2022-01-02 NOTE — Progress Notes (Signed)
NAME:  Jody Nguyen, MRN:  [id number], DOB:  09/15/1956, LOS: 1 ADMISSION DATE:  01/09/2022, CONSULTATION DATE:  01/18/2022 REFERRING MD:  Martie Lee, TRH, CHIEF COMPLAINT:  SOB   History of Present Illness:  66 yo F PMH ESRD, non-obstructive CAD, suspected pulmonary hypertension, Hx PE on eliquis who presented to [facility] ED 2/4 with chest pain, which radiates to arm. Similar sx had also promoted ED presentation on 2/1 at which time trops were 100. She was seen by cardiology outpt on 2/2 and chest pain was felt to be related to volume shift as pain had occurred during dialysis.   On 2/4 presentation, Pt states that episodic chest pain has been intermittent for the past week. Non-exertional, lasts approx 10 minutes. Associated nausea and SOB. Non-specific EKG changes  & was d/w cardiology, ruled out for STEMI. Trop 200. Placed on [place] for SpO2 80s.Admitted to [facility] for obvs with concern for PE and started on heparin gtt. Awaiting VQ scan. ECHO has been performed which reveals Severe RV RA enlargement, severely reduced RV function and septal bowing, felt consistent with chronic process such as severe pulmonary HTN.    PCCM has been consulted in this setting   Pertinent  Medical History  ESRD PE Pulmonary HTN  CAD CVA HLD HTN S/p MV repair  Pericardial effusion r/t minoxidil  Diastolic HF DM2  Significant Hospital Events: Including procedures, antibiotic start and stop dates in addition to other pertinent events   2/4 ED presentation with recurring chest pain -- STEMI ruled out. Stable, mildly hypoxic and placed on [place] 2/5 admit to [facility] for obvs. Concern for PE, ECHO performed by cardiology reportedly shows RV failure, but felt more consistent with chronic process like severe pulmHTN. Awaiting VQ scan. Pulm consulted   Interim History / Subjective:  Seen lying in bed lethargic but arouses easily Denies any acute complaints    Objective   Blood pressure 100/69, pulse 93, temperature 98.3 F (36.8  C), temperature source Oral, resp. rate 19, height 4' 11.5" (1.511 m), weight 77 kg, SpO2 95 %.        Intake/Output Summary (Last 24 hours) at 01/02/2022 6948 Last data filed at 01/02/2022 0400 Gross per 24 hour  Intake 571.76 ml  Output --  Net 571.76 ml   Filed Weights   12/30/2021 1820 01/01/22 0153  Weight: 76.7 kg 77 kg    Examination: General: Acute on chronically ill appearing middle aged female lying in bed, in NAD HEENT: [place]/AT, MM pink/moist, PERRL,  Neuro: Alert and oriented x3, lethargic  CV: s1s2 regular rate and rhythm, no murmur, rubs, or gallops,  PULM:  Slightly diminished bilaterally, no increased work of breathing, no added breath sounds, on 4L [place] GI: soft, bowel sounds active in all 4 quadrants, non-tender, non-distended, tolerating oral diet Extremities: warm/dry, no edema  Skin: no rashes or lesions  Resolved Hospital Problem list     Assessment & Plan:  Acute respiratory failure with hypoxia  Pulmonary HTN with Hx of systolic CHF --ECHO findings with RA RV dilation, RV failure felt more c/w chronic process but difficult to distinguish -BNP 1,432 Concern for PE  -given episodic chest pain, SOB, hx PE, there is concern for possible PE now. On home eliquis for prior PE but unsure compliance with therapy  Dyspnea in setting of above  P: VQ scan remains pending (hx of ESRD) Continue heparin drip for now  ECHO remains pending as well  Primary team to consult cardiology today  Volume  removal per HD  Would benefit from outpatient pulmonary follow up at discharge   RBBB  Prolonged qtc Non-obstructive CAD S/p MV repair Chronic diastolic heart failure  ESRD HLD DM2 HTN Hx PE  Chronic anticoagulation- Trop is 200 and has been stable on repeat checks P: Per primary with cardiology consult    Best Practice (right click and "Reselect all SmartList Selections" daily)  Per primary   Critical care time: NA  Evalyn Shultis D. Kenton Kingfisher, NP-C [facility] Personal contact information can be found on Amion  01/02/2022, 9:37 AM

## 2022-01-02 NOTE — Progress Notes (Signed)
Pharmacy Consult for Pulmonary Hypertension Treatment   Indication - Continuation of prior to admission medication   Patient is 66 y.o.  with history of PAH on chronic Macitentan (Opsumit) PTA and will be continued while hospitalized.   Continuing this medication order as an inpatient requires that monitoring parameters per REMS requirements must be met.  Chronic therapy is under the supervision of Kelkar, Kavin Leech, MD at [facility] who is enrolled in the REMS program and is being notified of continuation of therapy. Office was faxed 01/02/22. Per patient report has previously been educated on Pregnancy Risk and Hepatotoxicity . On admission pregnancy risk has been assessed and no monitoring required - patient is not of reproductive age. Hepatic function has been evaluated. LFTs ordered for today and pending. Most recent LFTs from 12/08/21 within normal limits.  Hepatic Function Latest Ref Rng & Units 01/02/2022 01/01/2022 12/08/2021  Total Protein 6.5 - 8.1 g/dL - 7.7 7.5  Albumin 3.5 - 5.0 g/dL 3.4(L) 3.8 -  AST 15 - 41 U/L - 23 16  ALT 0 - 44 U/L - 24 19  Alk Phosphatase 38 - 126 U/L - 128(H) -  Total Bilirubin 0.3 - 1.2 mg/dL - 0.8 0.6  Bilirubin, Direct 0.0 - 0.2 mg/dL - 0.2 -    If any question arise or pregnancy is identified during hospitalization, contact for bosentan and macitentan: [phone]; ambrisentan: [phone].  Thank for you allowing [place] to participate in the care of this patient.   Zenaida Deed, PharmD PGY1 Acute Care Pharmacy Resident  Phone: [phone] 01/02/2022  9:30 AM  Please check [email or web address] for unit-specific pharmacy phone numbers.

## 2022-01-02 NOTE — Telephone Encounter (Signed)
Patient has been seen in the hospital

## 2022-01-02 NOTE — Progress Notes (Signed)
HD ready receive to pt however primary notified staff, pt in SVT this morning without much improvement.

## 2022-01-02 NOTE — Consult Note (Addendum)
Cardiology Consultation:   Patient ID: Jody Nguyen MRN: [id number]; DOB: March 20, 1956  Admit date: 01/07/2022 Date of Consult: 01/02/2022  PCP:  Unk Pinto, MD   [facility] Providers Cardiologist:  [name], MD       Pulmoanry hypertension at [facility] Dr Alric Ran Patient Profile:   Jody Nguyen is a 66 y.o. female with a hx of  pulmonary HTN, diastolic CHF, hx MV repair, non obstructive CAD, HTN, HLD, ESRD  who is being seen 01/02/2022 for the evaluation of chest pain with admit 01/10/2022 with chest pain and SOB at the request of Dr. [place]  History of Present Illness:   Ms. Langland with hx as above with cardiac cath 06/18/15 with non obstructive disease (30% LAD), severe pulmonary artery HTN, PA saturation 43%. Cardiac index 1.86. Prominent V waves indicative of mitral regurgitation.   Nuc study 09/20/21 with no ischemia.  Echo 08/28/21 with EF 50-55%, no RWMA, mod LVH, RV systoic function is mildly reduced.  RV is mildly enlarged.  Estimated RV systolic pressure is 54.6 mmHg. MC repaired Mild mitral valve regurgitation. The mean mitral valve gradient is 9.0 mmHg. There is a 26 mm Sorin Memo 3D Rechord present in the mitral position. Suggestive of at least moderately stenotic mitral valve. Mod. TR,   MV repair in 2016 for severe MR Complex valvuloplasty including artificial Gore-tex neochord placement x10 and 26 mm Sorin Memo 3D Rechord ring annuloplasty via right mini thoracotomy approach. The repair was complicated by hypoxic respiratory failure requiring ECMO and eventual tracheostomy. She developed renal failure during this. RVSP approximately 76 mmHg  RHC 09/2019 (done on a non HD day) PA 55/25 (36), PCWP 18, BP 152/93 (113), PA sat 81, PVR 3.96 & 3.52, CI 2.69 by Fick and 3.02 by Thermo RHC 08/2021: RA 6, PA 50/22 (31), PCWP 18, BP 125/79, PA 68%, PVR 1.9 Fick, FCI 2.2.=-> on opsumit 10 and sildenafil 60 mg tid  Pt presented 01/01/2022 with chest pain.  Has been episodic for past  week.  Non exertional lasting 10 min.  Associated with nausea and SOB.  Non specific EKG changes, with concern for PE IV heparin started.   Bedside echo by cards fellow with severe RV and RA enlargement.  RV function is severely reduced, new from 08/2021, the septum is bowing towards LV and LA: this is consistent with chronic process- Pulm HTN, I suspect severe.  EF is grossly around 50%, IVC dilated and non collapsible.   VQ scan.  Stable cardiomegaly with no active disease.    Hs troponin 210, 201, 194, 189, 207  BNP 1432 DDimer 1.22   EKG:  The EKG was personally reviewed and demonstrates:  initial EKGs with significant artifact to eval ST follow up SR 1st degree AV block and rare PVC non specific ST abnormality.   She then had ST at 137 and new RBBB and LAFB.  today again with SVt at 137 from 8:23 to 8:40.   Telemetry:  Telemetry was personally reviewed and demonstrates:  SR with 1st degree AV block then about 20 min of SVT at 137   With the rapid HR today she had chest pain.  + nausea and dry Heaves.  This slowed HR.  Additionally during the night similar episode sitting up and BP dropped to 87/68 with vomiting HR to 50.- she has noted rapid HR with chest pain episodes and resolution of pain with vomiting and HR slows.   Currently. Resting comfortable waiting for dialysis. No chest  pain currently.    BP 100/69 P 90s afebrile.   Past Medical History:  Diagnosis Date   Anemia    Chronic diastolic CHF (congestive heart failure) (HCC)    Chronic diastolic heart failure (HCC)    CKD (chronic kidney disease) stage 4, GFR 15-29 ml/min (HCC)    dialysis M/W/F   Constipation    CVA (cerebral infarction) 1997   no residual deficit   Diverticulitis    DM (diabetes mellitus) (HCC)    type 2   Heart murmur    History of cardiovascular stress test    a. Myoview Oct 2012 showed EF 49%, no ischemia, LVE   HLD (hyperlipidemia)    takes Crestor daily   Hypertension    Pericardial effusion     chronic; felt to be poss related to minoxidil >> DC'd   Pulmonary hypertension ([place]) 06/18/2015   Respiratory failure, acute hypoxic, post-operative 07/08/2015   Requiring ECMO support   S/P cardiac catheterization    a. R/L [facility] [place] 06/18/15:  mLAD 30%; severe pulmo HTN with PA sat 43%, CI 1.86, prominent V waves indicative of MR; resting hypoxemia O2 sat 86% on RA   S/P minimally invasive mitral valve repair 07/08/2015   Complex valvuloplasty including artificial Gore-tex neochord placement x10 and 26 mm Sorin Memo 3D Rechord ring annuloplasty via right mini thoracotomy approach   Severe mitral regurgitation    Shortness of breath dyspnea    Stroke ([place]) 1997   no residual effect   Vitamin D deficiency    Wears glasses     Past Surgical History:  Procedure Laterality Date   A/V FISTULAGRAM Right 08/16/2017   Procedure: A/V Fistulagram;  Surgeon: Conrad St. Francis, MD;  Location: [place] CV [facility];  Service: Cardiovascular;  Laterality: Right;   AV FISTULA PLACEMENT Left 10/22/2015   Procedure: RADIOCEPHALIC ARTERIOVENOUS (AV) FISTULA CREATION;  Surgeon: Serafina Mitchell, MD;  Location: [place] OR;  Service: Vascular;  Laterality: Left;   AV FISTULA PLACEMENT Right 05/22/2017   Procedure: ARTERIOVENOUS (AV) FISTULA CREATION-RIGHT ARM;  Surgeon: Waynetta Sandy, MD;  Location: [place];  Service: Vascular;  Laterality: Right;   CANNULATION FOR CARDIOPULMONARY BYPASS N/A 07/08/2015   Procedure: CANNULATION FOR ECMO;  Surgeon: Rexene Alberts, MD;  Location: [place];  Service: Open Heart Surgery;  Laterality: N/A;   CARDIAC CATHETERIZATION     CARDIAC CATHETERIZATION N/A 06/18/2015   Procedure: Right/Left Heart Cath and Coronary Angiography;  Surgeon: Jettie Booze, MD;  Location: [place] [facility];  Service: Cardiovascular;  Laterality: N/A;   CESAREAN SECTION     x 2   COLONOSCOPY     EXCHANGE OF A DIALYSIS CATHETER N/A 06/28/2017   Procedure: EXCHANGE OF A DIALYSIS CATHETER;  Surgeon: Waynetta Sandy, MD;  Location: [place];  Service: Vascular;  Laterality: N/A;   FISTULA SUPERFICIALIZATION Left 12/28/2015   Procedure: SUPERFICIALIZATION LEFT RADIOCEPHALIC FISTULA;  Surgeon: Serafina Mitchell, MD;  Location: [place];  Service: Vascular;  Laterality: Left;   FISTULA SUPERFICIALIZATION Right 08/21/2017   Procedure: FISTULA SUPERFICIALIZATION RIGHT ARM;  Surgeon: Waynetta Sandy, MD;  Location: [place];  Service: Vascular;  Laterality: Right;   INSERTION OF DIALYSIS CATHETER Right 04/28/2017   Procedure: INSERTION OF DIALYSIS CATHETER-RIGHT INTERNAL JUGULAR PLACEMENT;  Surgeon: Elam Dutch, MD;  Location: [place];  Service: Vascular;  Laterality: Right;   LIGATION OF ARTERIOVENOUS  FISTULA Left 04/28/2017   Procedure: LIGATION OF ARTERIOVENOUS  FISTULA;  Surgeon: Oneida Alar,  Jessy Oto, MD;  Location: [place];  Service: Vascular;  Laterality: Left;   MITRAL VALVE REPAIR Right 07/08/2015   Procedure: MINIMALLY INVASIVE MITRAL VALVE REPAIR with a 26 Sorin Memo 3D Rechord;  Surgeon: Rexene Alberts, MD;  Location: [place];  Service: Open Heart Surgery;  Laterality: Right;   MULTIPLE EXTRACTIONS WITH ALVEOLOPLASTY N/A 06/30/2015   Procedure: MULTIPLE EXTRACTIONS OF TOOTH #'S 4  AND 30 WITH ALVEOLOPLASTY AND GROSS DEBRIDEMENT  OF REMAINING TEETH;  Surgeon: Lenn Cal, DDS;  Location: [place];  Service: Oral Surgery;  Laterality: N/A;   PERIPHERAL VASCULAR CATHETERIZATION Left 03/28/2016   Procedure: A/V Fistulagram;  Surgeon: Serafina Mitchell, MD;  Location: [place] [facility];  Service: Cardiovascular;  Laterality: Left;  lower arm   TEE WITHOUT CARDIOVERSION N/A 06/08/2015   Procedure: TRANSESOPHAGEAL ECHOCARDIOGRAM (TEE);  Surgeon: Lelon Perla, MD;  Location[facility];  Service: Cardiovascular;  Laterality: N/A;   TEE WITHOUT CARDIOVERSION N/A 07/08/2015   Procedure: TRANSESOPHAGEAL ECHOCARDIOGRAM (TEE);  Surgeon: Rexene Alberts, MD;  Location: [place];  Service: Open Heart Surgery;   Laterality: N/A;     Home Medications:  Prior to Admission medications   Medication Sig Start Date End Date Taking? Authorizing Provider  acetaminophen (TYLENOL) 500 MG tablet Take 500 mg by mouth as needed (for pain.).   Yes [provider]  apixaban (ELIQUIS) 5 MG TABS tablet Take 5 mg by mouth 2 (two) times daily.   Yes [provider]  aspirin EC 81 MG tablet Take 81 mg by mouth daily.   Yes [provider]  carvedilol (COREG) 25 MG tablet Take 12.5 mg by mouth 2 (two) times daily.   Yes [provider]  cinacalcet (SENSIPAR) 60 MG tablet Take 1 tablet (60 mg total) by mouth daily. Patient taking differently: Take 60 mg by mouth See admin instructions. Takes at dialysis M/W/F 09/11/17  Yes Unk Pinto, MD  hydrALAZINE (APRESOLINE) 50 MG tablet Take 1 tablet (50 mg total) by mouth 3 (three) times daily. FOR BLOOD  PRESSURE AND HEART 11/09/20  Yes Lelon Perla, MD  latanoprost (XALATAN) 0.005 % ophthalmic solution Place 1 drop into both eyes at bedtime. 09/16/18  Yes [provider]  losartan (COZAAR) 50 MG tablet Take 1 tablet Daily for BP Patient taking differently: Take 50 mg by mouth daily. 03/19/20  Yes Unk Pinto, MD  macitentan (OPSUMIT) 10 MG tablet Take 10 mg by mouth daily.   Yes [provider]  nitroGLYCERIN (NITROSTAT) 0.4 MG SL tablet Place 0.4 mg under the tongue every 5 (five) minutes x 3 doses as needed for chest pain. 08/24/21  Yes [provider]  ondansetron (ZOFRAN ODT) 4 MG disintegrating tablet Take 1 tablet (4 mg total) by mouth every 8 (eight) hours as needed for nausea or vomiting. 02/23/19  Yes Fawze, Mina A, PA-C  pantoprazole (PROTONIX) 40 MG tablet Take 1 tablet (40 mg total) by mouth daily. Patient taking differently: Take 40 mg by mouth daily as needed (GERD). 08/29/21 01/01/23 Yes Barb Merino, MD  RENVELA 800 MG tablet Take 1,600-2,400 mg by mouth 3 (three) times daily with meals.  12/21/15  Yes [provider]  rosuvastatin (CRESTOR) 20 MG tablet Take 1 tablet Daily for Cholesterol Patient taking differently: Take 20 mg by mouth at bedtime. 03/19/20  Yes Unk Pinto, MD  sertraline (ZOLOFT) 25 MG tablet Take 1 tablet Daily for Mood Patient taking differently: Take 25 mg by mouth daily. 03/19/20  Yes Unk Pinto, MD  sevelamer carbonate (RENVELA) 2.4 g PACK Take 2.4 g by mouth 3 (three) times daily with meals.   Yes [provider]  sildenafil (REVATIO) 20 MG tablet Take 2 tablets (40 mg) 3 x  /day fr Pulmonary Hypertension Patient taking differently: Take 40 mg by mouth 3 (three) times daily. 03/26/20  Yes Unk Pinto, MD  azelastine (ASTELIN) 0.1 % nasal spray Use 1 to 2 sprays each nostril 2 to 3 x / day Patient not taking: Reported on 01/01/2022 03/26/18   Unk Pinto, MD    Inpatient Medications: Scheduled Meds:  aspirin EC  81 mg Oral Daily   cinacalcet  90 mg Oral Once per day on Mon Wed Fri   doxercalciferol  9 mcg Intravenous Q M,W,F-HD   insulin aspart  0-5 Units Subcutaneous QHS   insulin aspart  0-6 Units Subcutaneous TID WC   latanoprost  1 drop Both Eyes QHS   macitentan  10 mg Oral Daily   rosuvastatin  20 mg Oral QHS   sildenafil  40 mg Oral TID   Continuous Infusions:  heparin 1,200 Units/hr (01/02/22 0339)   PRN Meds: acetaminophen **OR** acetaminophen, hydrALAZINE, pantoprazole  Allergies:    Allergies  Allergen Reactions   Minoxidil Other (See Comments)    Pericardial effusion   Lipitor [Atorvastatin] Other (See Comments)    MYALGIAS > "pain in legs" Tolerates rosuvastatin   Morphine And Related Itching   Ace Inhibitors Other (See Comments)    REACTION: "not sure...think it made me drowsy all the time"    Social History:   Social History   Socioeconomic History   Marital status: Married    Spouse name: Not on file   Number of children: 3   Years of education: Not on file   Highest education  level: Not on file  Occupational History    Comment: Unemployed; used to work as a Radio broadcast assistant  Tobacco Use   Smoking status: Never   Smokeless tobacco: Never  Vaping Use   Vaping Use: Never used  Substance and Sexual Activity   Alcohol use: No    Alcohol/week: 0.0 standard drinks   Drug use: No   Sexual activity: Not on file  Other Topics Concern   Not on file  Social History Narrative   Not on file   Social Determinants of Health   Financial Resource Strain: Not on file  Food Insecurity: No Food Insecurity   Worried About Charity fundraiser in the Last Year: Never true   [place] in the Last Year: Never true  Transportation Needs: No Transportation Needs   Lack of Transportation (Medical): No   Lack of Transportation (Non-Medical): No  Physical Activity: Not on file  Stress: Not on file  Social Connections: Not on file  Intimate Partner Violence: Not on file    Family History:    Family History  Problem Relation Age of Onset   Stroke Mother    Breast cancer Sister    Cancer Sister        breast cancer   Heart attack Brother        MI in his 41s   Heart disease Brother        before age 56   Cancer Brother      ROS:  Please see the history of present illness.  General:no colds or fevers, no weight changes Skin:no rashes or ulcers HEENT:no blurred vision, no congestion CV:see HPI  PUL:see HPI GI:no diarrhea constipation or melena, no indigestion GU:no hematuria, no dysuria, still does pass urine MS:no joint pain, no claudication Neuro:no syncope, no lightheadedness Endo:no diabetes, no thyroid disease ESRD on HD MWF last dialysis on Friday.   All other ROS reviewed and negative.     Physical Exam/Data:   Vitals:   01-26-22 1438 2022-01-26 2033 01/02/22 0533 01/02/22 0844  BP: 103/74 93/69 105/75 100/69  Pulse: 96 90 95 93  Resp:  20 19   Temp: (!) 97.5 F (36.4 C) 97.8 F (36.6 C) 98.3 F (36.8 C)   TempSrc: Oral Oral Oral    SpO2: 96% 93% 98% 95%  Weight:      Height:        Intake/Output Summary (Last 24 hours) at 01/02/2022 1131 Last data filed at 01/02/2022 0400 Gross per 24 hour  Intake 571.76 ml  Output --  Net 571.76 ml   Last 3 Weights 01-26-22 01/02/2022 12/29/2021  Weight (lbs) 169 lb 12.1 oz 169 lb 169 lb 12.8 oz  Weight (kg) 77 kg 76.658 kg 77.021 kg     Body mass index is 33.71 kg/m.  General:  Well nourished, well developed, in no acute distress resting in bed HEENT: normal Neck: + JVD but for dialysis today Vascular: No carotid bruits; Distal pulses 2+ bilaterally Cardiac:  normal S1, S2; RRR; soft murmur no gallup rub or click Lungs:  diminished breath sounds to auscultation bilaterally, no wheezing, rhonchi or rales  Abd: soft, nontender, no hepatomegaly  Ext: no edema Musculoskeletal:  No deformities, BUE and BLE strength normal and equal Skin: warm and dry  Neuro:  alert and oriented X 3 MAE follows commands no focal abnormalities noted Psych:  Normal affect   Relevant CV Studies: Bedside ECHO  performed 01-26-22 at the request of Dr Wandra Feinstein given concerns for PE and evaluate right heart function. -> Severe RV and RA enlargement, RV function is severely reduced, the septum is bowing towards LV and LA: this is consistent with chronic process- Pulm HTN, I suspect severe -> LV function is grossly around 50% -> IVC is dilated and non collapsable   Echo findings likely indicative of chronic severe pulmonary HTN. McConnel's sign or acute echo findings of PE cannot be distinguished in this scenario. Clinical correlation and further testing CT PE or V/Q scan can be obtained.  Echo 08/28/21 IMPRESSIONS     1. Left ventricular ejection fraction, by estimation, is 50 to 55%. The  left ventricle has low normal function. The left ventricle has no regional  wall motion abnormalities. There is moderate left ventricular hypertrophy.  Left ventricular diastolic  parameters are indeterminate.    2. Right ventricular systolic function is mildly reduced. The right  ventricular size is mildly enlarged. Mildly increased right ventricular  wall thickness. There is moderately elevated pulmonary artery systolic  pressure. The estimated right ventricular  systolic pressure is 60.6 mmHg.   3. Left atrial size was mild to moderately dilated.   4. Right atrial size was mildly dilated.   5. The mitral valve has been repaired/replaced. Mild mitral valve  regurgitation. The mean mitral valve gradient is 9.0 mmHg. There is a 26  mm Sorin Memo 3D Rechord present in the mitral position. Suggestive of at  least moderately stenotic mitral valve.   6. Tricuspid valve regurgitation is moderate.   7. The aortic valve is tricuspid. Aortic valve regurgitation is not  visualized. No aortic stenosis is present. Aortic valve mean gradient  measures 4.0 mmHg.   8. The inferior vena cava is normal in size with <50% respiratory  variability, suggesting right atrial pressure of 8 mmHg.   Comparison(s): Prior images unable to be directly viewed.   FINDINGS   Left Ventricle: Left ventricular ejection fraction, by estimation, is 50  to 55%. The left ventricle has low normal function. The left ventricle has  no regional wall motion abnormalities. The left ventricular internal  cavity size was normal in size.  There is moderate left ventricular hypertrophy. Left ventricular diastolic  parameters are indeterminate.   Right Ventricle: The right ventricular size is mildly enlarged. Mildly  increased right ventricular wall thickness. Right ventricular systolic  function is mildly reduced. There is moderately elevated pulmonary artery  systolic pressure. The tricuspid  regurgitant velocity is 3.41 m/s, and with an assumed right atrial  pressure of 8 mmHg, the estimated right ventricular systolic pressure is  87.8 mmHg.   Left Atrium: Left atrial size was mild to moderately dilated.   Right Atrium: Right atrial  size was mildly dilated.   Pericardium: There is no evidence of pericardial effusion.   Mitral Valve: The mitral valve has been repaired/replaced. Mild mitral  valve regurgitation. There is a 26 mm Sorin Memo 3D Rechord present in the  mitral position. MV peak gradient, 15.1 mmHg. The mean mitral valve  gradient is 9.0 mmHg.   NUC study 09/20/21 Findings are consistent with prior myocardial infarction. The study is intermediate risk.   No ST deviation was noted.   LV perfusion is abnormal. Defect 1: There is a medium defect with moderate reduction in uptake present in the mid to basal inferior location(s) that is fixed. There is abnormal wall motion in the defect area. Consistent with infarction.   Left ventricular function is abnormal. Global function is moderately reduced. Nuclear stress EF: 42 %. The left ventricular ejection fraction is moderately decreased (30-44%). End diastolic cavity size is normal.   Prior study not available for comparison.     Conclusion:Findings consistent with prior myocardial infarction with no evidence of peri-infarct ischemia. Intermediate risk study due to ejection fraction.  [facility] 09/2019 (done on a non HD day) PA 55/25 (36), PCWP 18, BP 152/93 (113), PA sat 81, PVR 3.96 & 3.52, CI 2.69 by Fick and 3.02 by Thermo RHC 08/2021: RA 6, PA 50/22 (31), PCWP 18, BP 125/79, PA 68%, PVR 1.9 Fick, FCI 2.2.=-> on opsumit 10 and sildenafil 60 mg tid   V/Q 11/2020: unchanged left lower lobe apical segment PE   09/06/21 at [facility]  [place] today shows mild post capillary pulmonary hypertension, in fact her PVR does not meet the threshold for any post capillary pulmonary arterial hypertension. The postcapillary portion is driven by fluid. Her most recent echo shows normal RV function and RV size and no signs of RV dysfunction  Laboratory Data:  High Sensitivity Troponin:   Recent Labs  Lab 01/20/2022 1853 01/14/2022 2052 01/01/22 0625 01/01/22 1211 01/01/22 1402   TROPONINIHS 210* 201* 194* 189* 207*     Chemistry Recent Labs  Lab 01/18/2022 1853 01/01/22 0625 01/02/22 0704  NA 137 137 134*  K 3.2* 3.8 3.7  CL 91* 95* 92*  CO2 32 24 24  GLUCOSE 119* 112* 109*  BUN 29* 30* 38*  CREATININE 7.25* 7.94* 9.15*  CALCIUM 9.0 8.8* 8.6*  MG  --  2.3  --   GFRNONAA 6* 5* 4*  ANIONGAP 14 18* 18*    Recent Labs  Lab 01/01/22 1402 01/02/22 0704  PROT 7.7  --   ALBUMIN 3.8 3.4*  AST 23  --   ALT 24  --   ALKPHOS 128*  --   BILITOT 0.8  --    Lipids No results for input(s): CHOL, TRIG, HDL, LABVLDL, LDLCALC, CHOLHDL in the last 168 hours.  Hematology Recent Labs  Lab 12/28/21 1101 01/04/2022 1853 01/02/22 0704  WBC 4.1 3.8* 4.4  RBC 4.17 4.01 3.85*  HGB 11.2* 10.7* 10.8*  HCT 35.6* 34.9* 32.7*  MCV 85.4 87.0 84.9  MCH 26.9 26.7 28.1  MCHC 31.5 30.7 33.0  RDW 21.3* 21.9* 21.2*  PLT 191 130* 118*   Thyroid No results for input(s): TSH, FREET4 in the last 168 hours.  BNP Recent Labs  Lab 01/01/22 0941  BNP 1,432.4*    DDimer  Recent Labs  Lab 01/01/22 0940  DDIMER 1.22*     Radiology/Studies:  DG Chest Port 1 View  Result Date: 01/02/2022 CLINICAL DATA:  Preop EXAM: PORTABLE CHEST 1 VIEW COMPARISON:  Chest x-ray dated December 31, 2021 FINDINGS: Unchanged enlarged cardiac and mediastinal contours. Lungs are clear. No large effusion or evidence of pneumothorax. IMPRESSION: Stable cardiomegaly with no evidence of active disease. Electronically Signed   By: Yetta Glassman M.D.   On: 01/02/2022 08:17   DG Chest Port 1 View  Result Date: 01/08/2022 CLINICAL DATA:  Generalized chest pain and shortness of breath since yesterday. EXAM: PORTABLE CHEST 1 VIEW COMPARISON:  Chest x-rays dated 12/28/2021 and 12/20/2021. FINDINGS: Stable cardiomegaly. Lungs are clear. No pleural effusion or pneumothorax is seen. Osseous structures about the chest are unremarkable. IMPRESSION: 1. No active disease. No evidence of pneumonia or pulmonary edema.  2. Stable cardiomegaly. Electronically Signed   By: Franki Cabot M.D.   On: 01/23/2022 19:07     Assessment and Plan:   Chest pain/SVT  with elevated to 200s hs troponin but flat.  She has noticed and now documented on tele rapid HR with chest pain, vomiting and SVT breaks to SR to SB.  She tells me each episode of vomiting Chest pain resolves.  She is for VQ scan today to rule out PE- she is on eliquis for hx of PE and may have missed once on Thursday night.   She is on coreg 25 BID may need more medication.  She is scheduled for complete Echo today. Severe pulmonary hypertension with severely reduced RV function.  This is new from 08/2021.  Probable Rt heart cath if no PE.  In past was [facility] at [facility] last visit 08/2021 with no RV dysfunction.  On sildenafil and opsumit.   Possible PE with IV heparin.  Continue for now.  Minimal CAD in 2016 with cath  possible LHC to r/o ischemia if no PE.  I am assuming the chest pain is related to SVT in that it resolves with vomiting and slowing of HR.  MV repair 2016 for severe MR Complex valvuloplasty including artificial Gore-tex neochord placement x10 and 26 mm Sorin Memo 3D Rechord ring annuloplasty via right mini thoracotomy approach. The repair was complicated by hypoxic respiratory failure requiring ECMO and eventual tracheostomy. She developed renal failure during this. RVSP approximately 76 mmHg- Echo 08/2021 with mild MR and suggestive of at least moderately stenotic mitral valve.   ESRD on HD MWF, wanting renal transplant and had been cleared by [facility]. She feels her dialysis is controlling volume. Anticoagulation for PE with + VQ in 2021 and no change on  follow up 11/2020 thought due to non therapeutic INRs on coumadin so now on eliquis.  (Eliquis on hold and pt on heparin.  She has not missed any doses except for possibly Thursday night  New RBBB DM-2 per IM HLD on crestor 20   Risk Assessment/Risk Scores:     TIMI Risk Score for Unstable Angina  or Non-ST Elevation MI:   The patient's TIMI risk score is 5, which indicates a 26% risk of all cause mortality, new or recurrent myocardial infarction or need for urgent revascularization in the next 14 days.   For questions or updates, please contact [place] Please consult [email or web address] for contact info under    Signed, Cecilie Kicks, NP  01/02/2022 11:31 AM  As above, patient seen and examined.  Briefly she is a 66 year old female with past medical history of previous mitral valve repair, pulmonary hypertension, nonobstructive coronary disease, chronic diastolic congestive heart failure, hypertension, hyperlipidemia, end-stage renal disease for evaluation of chest pain and SVT.  Patient had a nuclear study in October that showed no ischemia.  Echocardiogram October 2022 showed ejection fraction 50 to 55%, mild RVE/RV dysfunction, previous mitral valve repair with mean gradient 9 mmHg suggestive of moderate mitral stenosis, mild mitral regurgitation and moderate tricuspid regurgitation.  Right heart catheterization at [facility] October 2022 showed PA pressure 50/22 with pulmonary capillary wedge pressure 18.  Patient is complaining of intermittent chest pain.  It is substernal radiating to her back.  It is not exertional.  Typically last approximately 10 minutes.  It is associated with heart racing.  She otherwise has some fatigue and dyspnea on exertion but no orthopnea, PND or pedal edema.  She has been admitted and cardiology asked to evaluate.  1 chest pain-patient has had difficulties with recurrent chest pain.  Her symptoms appear to be occurring in the setting of SVT.  However she has been to the emergency room 3 times recently and had a negative recent nuclear study.  I feel we should definitively rule out coronary disease.  The risk and benefits of cardiac catheterization including myocardial infarction, CVA and death were discussed and she agrees to proceed.  We will plan for tomorrow.  We  will hold apixaban.  2 SVT-patient appears to have episodes of SVT and develops chest pain with higher heart rates.  We will discontinue carvedilol and instead treat with Toprol (carvedilol has more blood pressure lowering effects).  Hopefully this will control her symptoms.  If not we will need to consider other options including potential ablation.  3 pulmonary hypertension-this is longstanding.  We will continue home dose of revatio and opsumit.  4 history of pulmonary embolus-continue apixaban.  5 history of mitral valve repair-follow-up echocardiogram is pending.  6 end-stage renal disease-dialysis per nephrology.  [name], MD

## 2022-01-03 ENCOUNTER — Encounter (HOSPITAL_COMMUNITY): Admission: EM | Disposition: E | Payer: Self-pay | Source: Home / Self Care | Attending: Internal Medicine

## 2022-01-03 ENCOUNTER — Encounter (HOSPITAL_COMMUNITY): Payer: Self-pay | Admitting: Cardiovascular Disease

## 2022-01-03 DIAGNOSIS — I2510 Atherosclerotic heart disease of native coronary artery without angina pectoris: Secondary | ICD-10-CM | POA: Diagnosis not present

## 2022-01-03 DIAGNOSIS — I25119 Atherosclerotic heart disease of native coronary artery with unspecified angina pectoris: Secondary | ICD-10-CM

## 2022-01-03 DIAGNOSIS — I471 Supraventricular tachycardia: Secondary | ICD-10-CM | POA: Diagnosis not present

## 2022-01-03 DIAGNOSIS — I25110 Atherosclerotic heart disease of native coronary artery with unstable angina pectoris: Secondary | ICD-10-CM | POA: Diagnosis present

## 2022-01-03 DIAGNOSIS — Z992 Dependence on renal dialysis: Secondary | ICD-10-CM | POA: Diagnosis not present

## 2022-01-03 DIAGNOSIS — R072 Precordial pain: Secondary | ICD-10-CM | POA: Diagnosis not present

## 2022-01-03 DIAGNOSIS — I5032 Chronic diastolic (congestive) heart failure: Secondary | ICD-10-CM | POA: Diagnosis not present

## 2022-01-03 DIAGNOSIS — N186 End stage renal disease: Secondary | ICD-10-CM | POA: Diagnosis not present

## 2022-01-03 HISTORY — PX: LEFT HEART CATH AND CORONARY ANGIOGRAPHY: CATH118249

## 2022-01-03 LAB — BASIC METABOLIC PANEL
Anion gap: 17 — ABNORMAL HIGH (ref 5–15)
BUN: 49 mg/dL — ABNORMAL HIGH (ref 8–23)
CO2: 24 mmol/L (ref 22–32)
Calcium: 7.5 mg/dL — ABNORMAL LOW (ref 8.9–10.3)
Chloride: 94 mmol/L — ABNORMAL LOW (ref 98–111)
Creatinine, Ser: 10.38 mg/dL — ABNORMAL HIGH (ref 0.44–1.00)
GFR, Estimated: 4 mL/min — ABNORMAL LOW (ref >60)
Glucose, Bld: 128 mg/dL — ABNORMAL HIGH (ref 70–99)
Potassium: 3.9 mmol/L (ref 3.5–5.1)
Sodium: 135 mmol/L (ref 135–145)

## 2022-01-03 LAB — CBC
HCT: 31.7 % — ABNORMAL LOW (ref 36.0–46.0)
Hemoglobin: 10.4 g/dL — ABNORMAL LOW (ref 12.0–15.0)
MCH: 27.8 pg (ref 26.0–34.0)
MCHC: 32.8 g/dL (ref 30.0–36.0)
MCV: 84.8 fL (ref 80.0–100.0)
Platelets: 90 10*3/uL — ABNORMAL LOW (ref 150–400)
RBC: 3.74 MIL/uL — ABNORMAL LOW (ref 3.87–5.11)
RDW: 21.2 % — ABNORMAL HIGH (ref 11.5–15.5)
WBC: 3.9 10*3/uL — ABNORMAL LOW (ref 4.0–10.5)
nRBC: 0.8 % — ABNORMAL HIGH (ref 0.0–0.2)

## 2022-01-03 LAB — HEPARIN LEVEL (UNFRACTIONATED): Heparin Unfractionated: 0.54 IU/mL (ref 0.30–0.70)

## 2022-01-03 LAB — GLUCOSE, CAPILLARY
Glucose-Capillary: 111 mg/dL — ABNORMAL HIGH (ref 70–99)
Glucose-Capillary: 124 mg/dL — ABNORMAL HIGH (ref 70–99)
Glucose-Capillary: 101 mg/dL — ABNORMAL HIGH (ref 70–99)
Glucose-Capillary: 85 mg/dL (ref 70–99)
Glucose-Capillary: 139 mg/dL — ABNORMAL HIGH (ref 70–99)

## 2022-01-03 LAB — HEPATITIS B SURFACE ANTIGEN: Hepatitis B Surface Ag: NONREACTIVE

## 2022-01-03 LAB — HEPATITIS B SURFACE ANTIBODY,QUALITATIVE: Hep B S Ab: REACTIVE — AB

## 2022-01-03 SURGERY — LEFT HEART CATH AND CORONARY ANGIOGRAPHY
Anesthesia: LOCAL

## 2022-01-03 MED ORDER — LIDOCAINE-PRILOCAINE 2.5-2.5 % EX CREA
1.0000 "application " | TOPICAL_CREAM | CUTANEOUS | Status: DC | PRN
  Filled 2022-01-03: qty 5

## 2022-01-03 MED ORDER — SODIUM CHLORIDE 0.9 % IV SOLN: 100.0000 mL | INTRAVENOUS | Status: DC | PRN

## 2022-01-03 MED ORDER — IOHEXOL 350 MG/ML SOLN
INTRAVENOUS | Status: DC | PRN
  Administered 2022-01-03: 65 mL

## 2022-01-03 MED ORDER — LIDOCAINE HCL (PF) 1 % IJ SOLN
INTRAMUSCULAR | Status: DC | PRN
  Administered 2022-01-03: 15 mL via INTRADERMAL

## 2022-01-03 MED ORDER — SODIUM CHLORIDE 0.9% FLUSH
3.0000 mL | Freq: Two times a day (BID) | INTRAVENOUS | Status: DC
  Administered 2022-01-03 – 2022-01-05 (×4): 3 mL via INTRAVENOUS

## 2022-01-03 MED ORDER — MIDAZOLAM HCL 2 MG/2ML IJ SOLN
INTRAMUSCULAR | Status: DC | PRN
  Administered 2022-01-03: 1 mg via INTRAVENOUS

## 2022-01-03 MED ORDER — MIDAZOLAM HCL 2 MG/2ML IJ SOLN
INTRAMUSCULAR | Status: AC
  Filled 2022-01-03: qty 2

## 2022-01-03 MED ORDER — HEPARIN (PORCINE) IN NACL 1000-0.9 UT/500ML-% IV SOLN
INTRAVENOUS | Status: AC
  Filled 2022-01-03: qty 500

## 2022-01-03 MED ORDER — FENTANYL CITRATE (PF) 100 MCG/2ML IJ SOLN
INTRAMUSCULAR | Status: DC | PRN
  Administered 2022-01-03: 25 ug via INTRAVENOUS

## 2022-01-03 MED ORDER — DIAZEPAM 5 MG PO TABS
5.0000 mg | ORAL_TABLET | ORAL | Status: DC | PRN
  Administered 2022-01-05: 5 mg via ORAL
  Filled 2022-01-03: qty 1

## 2022-01-03 MED ORDER — HEPARIN (PORCINE) 25000 UT/250ML-% IV SOLN
1250.0000 [IU]/h | INTRAVENOUS | Status: DC
  Administered 2022-01-03: 1200 [IU]/h via INTRAVENOUS
  Administered 2022-01-04 – 2022-01-05 (×2): 1250 [IU]/h via INTRAVENOUS
  Filled 2022-01-03 (×4): qty 250

## 2022-01-03 MED ORDER — LIDOCAINE HCL (PF) 1 % IJ SOLN
INTRAMUSCULAR | Status: AC
  Filled 2022-01-03: qty 30

## 2022-01-03 MED ORDER — SODIUM CHLORIDE 0.9% FLUSH: 3.0000 mL | INTRAVENOUS | Status: DC | PRN

## 2022-01-03 MED ORDER — FENTANYL CITRATE (PF) 100 MCG/2ML IJ SOLN
INTRAMUSCULAR | Status: AC
  Filled 2022-01-03: qty 2

## 2022-01-03 MED ORDER — HEPARIN SODIUM (PORCINE) 1000 UNIT/ML IJ SOLN
INTRAMUSCULAR | Status: AC
  Filled 2022-01-03: qty 10

## 2022-01-03 MED ORDER — ASPIRIN 81 MG PO CHEW
81.0000 mg | CHEWABLE_TABLET | Freq: Every day | ORAL | Status: DC
  Administered 2022-01-03 – 2022-01-05 (×3): 81 mg via ORAL
  Filled 2022-01-03 (×3): qty 1

## 2022-01-03 MED ORDER — ROSUVASTATIN CALCIUM 20 MG PO TABS: 20.0000 mg | ORAL_TABLET | Freq: Every day | ORAL | Status: DC

## 2022-01-03 MED ORDER — SODIUM CHLORIDE 0.9 % IV SOLN: 250.0000 mL | INTRAVENOUS | Status: DC | PRN

## 2022-01-03 MED ORDER — PENTAFLUOROPROP-TETRAFLUOROETH EX AERO: 1.0000 "application " | INHALATION_SPRAY | CUTANEOUS | Status: DC | PRN

## 2022-01-03 MED ORDER — METOPROLOL TARTRATE 12.5 MG HALF TABLET
12.5000 mg | ORAL_TABLET | Freq: Two times a day (BID) | ORAL | Status: DC
  Administered 2022-01-03 – 2022-01-04 (×2): 12.5 mg via ORAL
  Filled 2022-01-03 (×4): qty 1

## 2022-01-03 MED ORDER — VERAPAMIL HCL 2.5 MG/ML IV SOLN
INTRAVENOUS | Status: AC
  Filled 2022-01-03: qty 2

## 2022-01-03 MED ORDER — HYDRALAZINE HCL 20 MG/ML IJ SOLN: 10.0000 mg | INTRAMUSCULAR | Status: AC | PRN

## 2022-01-03 MED ORDER — LIDOCAINE HCL (PF) 1 % IJ SOLN: 5.0000 mL | INTRAMUSCULAR | Status: DC | PRN

## 2022-01-03 MED ORDER — ACETAMINOPHEN 325 MG PO TABS
650.0000 mg | ORAL_TABLET | ORAL | Status: DC | PRN
  Administered 2022-01-05 – 2022-01-06 (×2): 650 mg via ORAL

## 2022-01-03 MED ORDER — LABETALOL HCL 5 MG/ML IV SOLN: 10.0000 mg | INTRAVENOUS | Status: AC | PRN

## 2022-01-03 MED ORDER — HEPARIN (PORCINE) IN NACL 1000-0.9 UT/500ML-% IV SOLN
INTRAVENOUS | Status: DC | PRN
  Administered 2022-01-03 (×2): 500 mL

## 2022-01-03 SURGICAL SUPPLY — 11 items
CATH INFINITI 5FR MULTPACK ANG (CATHETERS) ×1 IMPLANT
GLIDESHEATH SLEND SS 6F .021 (SHEATH) IMPLANT
GUIDEWIRE INQWIRE 1.5J.035X260 (WIRE) IMPLANT
INQWIRE 1.5J .035X260CM (WIRE)
KIT HEART LEFT (KITS) ×2 IMPLANT
PACK CARDIAC CATHETERIZATION (CUSTOM PROCEDURE TRAY) ×2 IMPLANT
SHEATH PINNACLE 5F 10CM (SHEATH) ×1 IMPLANT
SHEATH PROBE COVER 6X72 (BAG) ×1 IMPLANT
TRANSDUCER W/STOPCOCK (MISCELLANEOUS) ×2 IMPLANT
TUBING CIL FLEX 10 FLL-RA (TUBING) ×2 IMPLANT
WIRE EMERALD 3MM-J .035X150CM (WIRE) ×1 IMPLANT

## 2022-01-03 NOTE — Consult Note (Addendum)
[phone]       [place] [address]             [phone]        Jody Nguyen [place] Medical Record #[id number] Date of Birth: 1956-03-15  Referring: No ref. provider found Primary Care: Unk Pinto, MD Primary Cardiologist:Brian Stanford Breed, MD  Chief Complaint:    Chief Complaint  Patient presents with   Chest Pain   Arm Pain    History of Present Illness:       History of Present Illness:    We are asked to consult on this 66 year old female who recently presented on several occasions to the ER with chest pain.  On 12/28/2021 she presented to the ER with symptoms of chest pain with radiation to both arms.  She has had multiple episodes over the previous several days.  She has a history of end-stage renal disease on hemodialysis.  Pain was worse during dialysis treatment.  He had a episode of sinus tachycardia while in the emergency room.  She has a history of pulmonary hypertension and is followed by [facility].  There is associated chronic diastolic heart failure and she is known to have undergone a previous mitral valve repair in 2016 by Dr. Roxy Manns.  At that time she had nonobstructive coronary artery disease per catheterization.  She has multiple other medical risk factors including hypertension, hyperlipidemia, type 2 diabetes and a previous history of pulmonary embolism on Eliquis.  High-sensitivity troponins were elevated in the ER.  She was COVID and flu negative.  Chest x-ray showed no active disease.  He received a fluid bolus for stabilization and heart rate improved from the episode of tachycardia.  She was felt to require admission for further monitoring and stabilization.  She is noted to have had a nuclear study in October 2022 showing no ischemia.  It is noted that her mitral valve repair in 5462 was complicated by hypoxic respiratory failure requiring ECMO with transfer to [facility] and eventual  tracheostomy.  At this time she developed the renal failure.  VQ scan was low risk for pulmonary embolism.  A repeat echocardiogram was performed on 01/02/2022 with the full report listed below.  Ejection fraction was estimated to be 40 to 45% with mildly reduced LV function.  The right ventricular systolic function was mildly reduced.  There was some enlargement of the right ventricle and elevated pulmonary artery pressures were mild.  There is no evidence for mitral valve regurgitation but there was moderate mitral stenosis.  The tricuspid valve showed severe regurgitation and moderate stenosis.  The aortic valve was unremarkable.  Cardiac catheterization was performed today that shows severe 99% proximal LAD disease, 90% first diagonal lesion, ostial circumflex to proximal circumflex lesion with 100% stenosis and a proximal RCA lesion with 100% stenosis.  Coronaries are noted to be severely calcified.  LVEDP was 23.  We are asked to see the patient for consideration of CABG due to the severity of the anatomy and significant symptoms.    Current Activity/ Functional Status: Patient is independent with mobility/ambulation, transfers, ADL's, IADL's.   Zubrod Score: At the time of surgery this patients most appropriate activity status/level should be described as: []     0    Normal activity, no symptoms []     1    Restricted in physical strenuous activity but ambulatory, able to do out light work [x]   2    Ambulatory and capable of self care, unable to do work activities, up and about                 more than 50%  Of the time                            []     3    Only limited self care, in bed greater than 50% of waking hours []     4    Completely disabled, no self care, confined to bed or chair []     5    Moribund  Past Medical History:  Diagnosis Date   Anemia    Chronic diastolic CHF (congestive heart failure) (HCC)    Chronic diastolic heart failure (HCC)    CKD (chronic kidney disease)  stage 4, GFR 15-29 ml/min (HCC)    dialysis M/W/F   Constipation    CVA (cerebral infarction) 1997   no residual deficit   Diverticulitis    DM (diabetes mellitus) ([place])    type 2   Heart murmur    History of cardiovascular stress test    a. Myoview Oct 2012 showed EF 49%, no ischemia, LVE   HLD (hyperlipidemia)    takes Crestor daily   Hypertension    Pericardial effusion    chronic; felt to be poss related to minoxidil >> DC'd   Pulmonary hypertension ([place]) 06/18/2015   Respiratory failure, acute hypoxic, post-operative 07/08/2015   Requiring ECMO support   S/P cardiac catheterization    a. R/L [facility] 06/18/15:  mLAD 30%; severe pulmo HTN with PA sat 43%, CI 1.86, prominent V waves indicative of MR; resting hypoxemia O2 sat 86% on RA   S/P minimally invasive mitral valve repair 07/08/2015   Complex valvuloplasty including artificial Gore-tex neochord placement x10 and 26 mm Sorin Memo 3D Rechord ring annuloplasty via right mini thoracotomy approach   Severe mitral regurgitation    Shortness of breath dyspnea    Stroke ([place]) 1997   no residual effect   Vitamin D deficiency    Wears glasses     Past Surgical History:  Procedure Laterality Date   A/V FISTULAGRAM Right 08/16/2017   Procedure: A/V Fistulagram;  Surgeon: Conrad Glasgow, MD;  Location: [place] [facility];  Service: Cardiovascular;  Laterality: Right;   AV FISTULA PLACEMENT Left 10/22/2015   Procedure: RADIOCEPHALIC ARTERIOVENOUS (AV) FISTULA CREATION;  Surgeon: Serafina Mitchell, MD;  Location: [place] [facility];  Service: Vascular;  Laterality: Left;   AV FISTULA PLACEMENT Right 05/22/2017   Procedure: ARTERIOVENOUS (AV) FISTULA CREATION-RIGHT ARM;  Surgeon: Waynetta Sandy, MD;  Location: [place];  Service: Vascular;  Laterality: Right;   CANNULATION FOR CARDIOPULMONARY BYPASS N/A 07/08/2015   Procedure: CANNULATION FOR ECMO;  Surgeon: Rexene Alberts, MD;  Location: [place];  Service: Open Heart Surgery;  Laterality: N/A;    CARDIAC CATHETERIZATION     CARDIAC CATHETERIZATION N/A 06/18/2015   Procedure: Right/Left Heart Cath and Coronary Angiography;  Surgeon: Jettie Booze, MD;  Location: [place] [facility];  Service: Cardiovascular;  Laterality: N/A;   CESAREAN SECTION     x 2   COLONOSCOPY     EXCHANGE OF A DIALYSIS CATHETER N/A 06/28/2017   Procedure: EXCHANGE OF A DIALYSIS CATHETER;  Surgeon: Waynetta Sandy, MD;  Location: [place];  Service: Vascular;  Laterality: N/A;   FISTULA SUPERFICIALIZATION Left 12/28/2015  Procedure: SUPERFICIALIZATION LEFT RADIOCEPHALIC FISTULA;  Surgeon: Serafina Mitchell, MD;  Location: [place];  Service: Vascular;  Laterality: Left;   FISTULA SUPERFICIALIZATION Right 08/21/2017   Procedure: FISTULA SUPERFICIALIZATION RIGHT ARM;  Surgeon: Waynetta Sandy, MD;  Location: [place];  Service: Vascular;  Laterality: Right;   INSERTION OF DIALYSIS CATHETER Right 04/28/2017   Procedure: INSERTION OF DIALYSIS CATHETER-RIGHT INTERNAL JUGULAR PLACEMENT;  Surgeon: Elam Dutch, MD;  Location: [place];  Service: Vascular;  Laterality: Right;   LEFT HEART CATH AND CORONARY ANGIOGRAPHY N/A 01/04/2022   Procedure: LEFT HEART CATH AND CORONARY ANGIOGRAPHY;  Surgeon: Troy Sine, MD;  Location: [place] [facility];  Service: Cardiovascular;  Laterality: N/A;   LIGATION OF ARTERIOVENOUS  FISTULA Left 04/28/2017   Procedure: LIGATION OF ARTERIOVENOUS  FISTULA;  Surgeon: Elam Dutch, MD;  Location: [place];  Service: Vascular;  Laterality: Left;   MITRAL VALVE REPAIR Right 07/08/2015   Procedure: MINIMALLY INVASIVE MITRAL VALVE REPAIR with a 26 Sorin Memo 3D Rechord;  Surgeon: Rexene Alberts, MD;  Location: [place];  Service: Open Heart Surgery;  Laterality: Right;   MULTIPLE EXTRACTIONS WITH ALVEOLOPLASTY N/A 06/30/2015   Procedure: MULTIPLE EXTRACTIONS OF TOOTH #'S 4  AND 30 WITH ALVEOLOPLASTY AND GROSS DEBRIDEMENT  OF REMAINING TEETH;  Surgeon: Lenn Cal, DDS;  Location: [place];   Service: Oral Surgery;  Laterality: N/A;   PERIPHERAL VASCULAR CATHETERIZATION Left 03/28/2016   Procedure: A/V Fistulagram;  Surgeon: Serafina Mitchell, MD;  Location: [place] [facility];  Service: Cardiovascular;  Laterality: Left;  lower arm   TEE WITHOUT CARDIOVERSION N/A 06/08/2015   Procedure: TRANSESOPHAGEAL ECHOCARDIOGRAM (TEE);  Surgeon: Lelon Perla, MD;  Location: [facility];  Service: Cardiovascular;  Laterality: N/A;   TEE WITHOUT CARDIOVERSION N/A 07/08/2015   Procedure: TRANSESOPHAGEAL ECHOCARDIOGRAM (TEE);  Surgeon: Rexene Alberts, MD;  Location: [place];  Service: Open Heart Surgery;  Laterality: N/A;    Social History   Tobacco Use  Smoking Status Never  Smokeless Tobacco Never    Social History   Substance and Sexual Activity  Alcohol Use No   Alcohol/week: 0.0 standard drinks     Allergies  Allergen Reactions   Minoxidil Other (See Comments)    Pericardial effusion   Lipitor [Atorvastatin] Other (See Comments)    MYALGIAS > "pain in legs" Tolerates rosuvastatin   Morphine And Related Itching   Ace Inhibitors Other (See Comments)    REACTION: "not sure...think it made me drowsy all the time"    Current Facility-Administered Medications  Medication Dose Route Frequency Provider Last Rate Last Admin   0.9 %  sodium chloride infusion  100 mL Intravenous PRN Loren Racer, PA-C       0.9 %  sodium chloride infusion  100 mL Intravenous PRN Loren Racer, PA-C       0.9 %  sodium chloride infusion  250 mL Intravenous PRN Troy Sine, MD       acetaminophen (TYLENOL) tablet 650 mg  650 mg Oral Q6H PRN Shela Leff, MD   650 mg at 01/02/22 1350   Or   acetaminophen (TYLENOL) suppository 650 mg  650 mg Rectal Q6H PRN Shela Leff, MD       acetaminophen (TYLENOL) tablet 650 mg  650 mg Oral Q4H PRN Troy Sine, MD       aspirin chewable tablet 81 mg  81 mg Oral Daily Troy Sine, MD  cinacalcet (SENSIPAR) tablet 90 mg  90 mg  Oral Once per day on Mon Wed Fri Loren Racer, PA-C   90 mg at 01/02/22 2836   diazepam (VALIUM) tablet 5 mg  5 mg Oral Q4H PRN Troy Sine, MD       doxercalciferol (HECTOROL) injection 9 mcg  9 mcg Intravenous Q M,W,F-HD Loren Racer, PA-C       heparin ADULT infusion 100 units/mL (25000 units/241mL)  1,200 Units/hr Intravenous Continuous Einar Grad, RPH       hydrALAZINE (APRESOLINE) injection 10 mg  10 mg Intravenous Q20 Min PRN Troy Sine, MD       hydrALAZINE (APRESOLINE) tablet 25 mg  25 mg Oral Q8H PRN [place] Eric J, DO       insulin aspart (novoLOG) injection 0-5 Units  0-5 Units Subcutaneous QHS Shela Leff, MD       insulin aspart (novoLOG) injection 0-6 Units  0-6 Units Subcutaneous TID WC Shela Leff, MD   1 Units at 01/01/22 1157   labetalol (NORMODYNE) injection 10 mg  10 mg Intravenous Q10 min PRN Troy Sine, MD       latanoprost (XALATAN) 0.005 % ophthalmic solution 1 drop  1 drop Both Eyes QHS [place] Donnamarie Poag, DO   1 drop at 01/02/22 2315   lidocaine (PF) (XYLOCAINE) 1 % injection 5 mL  5 mL Intradermal PRN Loren Racer, PA-C       lidocaine-prilocaine (EMLA) cream 1 application  1 application Topical PRN Loren Racer, PA-C       macitentan (OPSUMIT) tablet 10 mg  10 mg Oral Daily [place] Donnamarie Poag, DO   10 mg at 01/11/2022 6294   metoprolol tartrate (LOPRESSOR) tablet 12.5 mg  12.5 mg Oral BID Lelon Perla, MD       pantoprazole (PROTONIX) EC tablet 40 mg  40 mg Oral Daily PRN [place] Eric J, DO       pentafluoroprop-tetrafluoroeth (GEBAUERS) aerosol 1 application  1 application Topical PRN Loren Racer, PA-C       rosuvastatin (CRESTOR) tablet 20 mg  20 mg Oral QHS [place] Donnamarie Poag, DO   20 mg at 01/02/22 2316   sildenafil (REVATIO) tablet 40 mg  40 mg Oral TID [place] Eric J, DO   40 mg at 01/14/2022 7654   sodium chloride flush (NS) 0.9 % injection 3 mL  3 mL Intravenous Q12H Isaiah Serge, NP   3 mL at  01/20/2022 0959   sodium chloride flush (NS) 0.9 % injection 3 mL  3 mL Intravenous Q12H Troy Sine, MD       sodium chloride flush (NS) 0.9 % injection 3 mL  3 mL Intravenous PRN Troy Sine, MD        Medications Prior to Admission  Medication Sig Dispense Refill Last Dose   acetaminophen (TYLENOL) 500 MG tablet Take 500 mg by mouth as needed (for pain.).   Past Month   apixaban (ELIQUIS) 5 MG TABS tablet Take 5 mg by mouth 2 (two) times daily.   12/30/2021 at 2000   aspirin EC 81 MG tablet Take 81 mg by mouth daily.   12/30/2021   carvedilol (COREG) 25 MG tablet Take 12.5 mg by mouth 2 (two) times daily.   12/30/2021 at 2000   cinacalcet (SENSIPAR) 60 MG tablet Take 1 tablet (60 mg total) by mouth daily. (Patient taking differently: Take 60 mg by mouth See admin instructions. Takes  at dialysis M/W/F) 90 tablet 1 12/30/2021   hydrALAZINE (APRESOLINE) 50 MG tablet Take 1 tablet (50 mg total) by mouth 3 (three) times daily. FOR BLOOD  PRESSURE AND HEART 270 tablet 3 12/30/2021   latanoprost (XALATAN) 0.005 % ophthalmic solution Place 1 drop into both eyes at bedtime.   12/30/2021   losartan (COZAAR) 50 MG tablet Take 1 tablet Daily for BP (Patient taking differently: Take 50 mg by mouth daily.) 90 tablet 1 12/30/2021   macitentan (OPSUMIT) 10 MG tablet Take 10 mg by mouth daily.   12/30/2021   nitroGLYCERIN (NITROSTAT) 0.4 MG SL tablet Place 0.4 mg under the tongue every 5 (five) minutes x 3 doses as needed for chest pain.   unknown   ondansetron (ZOFRAN ODT) 4 MG disintegrating tablet Take 1 tablet (4 mg total) by mouth every 8 (eight) hours as needed for nausea or vomiting. 10 tablet 0 unknown   pantoprazole (PROTONIX) 40 MG tablet Take 1 tablet (40 mg total) by mouth daily. (Patient taking differently: Take 40 mg by mouth daily as needed (GERD).) 30 tablet 1 unknown   RENVELA 800 MG tablet Take 1,600-2,400 mg by mouth 3 (three) times daily with meals.  5 12/30/2021   rosuvastatin (CRESTOR) 20 MG tablet  Take 1 tablet Daily for Cholesterol (Patient taking differently: Take 20 mg by mouth at bedtime.) 90 tablet 3 12/30/2021   sertraline (ZOLOFT) 25 MG tablet Take 1 tablet Daily for Mood (Patient taking differently: Take 25 mg by mouth daily.) 90 tablet 3 12/30/2021   sevelamer carbonate (RENVELA) 2.4 g PACK Take 2.4 g by mouth 3 (three) times daily with meals.   unknown   sildenafil (REVATIO) 20 MG tablet Take 2 tablets (40 mg) 3 x  /day fr Pulmonary Hypertension (Patient taking differently: Take 40 mg by mouth 3 (three) times daily.) 540 tablet 3 12/30/2021   azelastine (ASTELIN) 0.1 % nasal spray Use 1 to 2 sprays each nostril 2 to 3 x / day (Patient not taking: Reported on 01/01/2022) 90 mL 3 Not Taking    Family History  Problem Relation Age of Onset   Stroke Mother    Breast cancer Sister    Cancer Sister        breast cancer   Heart attack Brother        MI in his 8s   Heart disease Brother        before age 51   Cancer Brother      Review of Systems:   Review of Systems  Constitutional:  Positive for malaise/fatigue.  HENT: Negative.    Eyes: Negative.   Respiratory:  Positive for shortness of breath. Negative for cough, hemoptysis, sputum production and wheezing.   Cardiovascular:  Positive for chest pain. Negative for palpitations, orthopnea, claudication, leg swelling and PND.  Gastrointestinal:  Positive for nausea and vomiting. Negative for abdominal pain, blood in stool and melena.  Genitourinary:        ESRD, makes some urine  Musculoskeletal:  Positive for myalgias.  Skin: Negative.   Neurological:  Positive for headaches.  Psychiatric/Behavioral: Negative.         Physical Exam: BP 111/62    Pulse (!) 56    Temp 97.7 F (36.5 C) (Oral)    Resp 19    Ht 4' 11.5" (1.511 m)    Wt 81.2 kg    SpO2 100%    BMI 35.55 kg/m    Physical Exam  Constitutional: She appears chronically ill.  HENT:  Mouth/Throat: Oropharynx is clear. Pharynx is normal.  Eyes: Pupils are  equal, round, and reactive to light. Conjunctivae are normal.  Neck: No JVD present. No neck adenopathy. No thyromegaly present.  Cardiovascular: Normal rate, regular rhythm, S1 normal and S2 normal. Exam reveals decreased pulses.  Murmur heard. Holosystolic murmur is present with a grade of 2/6. Pulmonary/Chest: Effort normal and breath sounds normal. She has no wheezes. She has no rales. She exhibits no tenderness.  Abdominal: Soft. Bowel sounds are normal. She exhibits no distension and no mass. There is no abdominal tenderness. A hernia is present.  Umbilical hernia Tender to touch but reduces easily  Musculoskeletal:        General: No tenderness, deformity or edema.     Cervical back: Normal range of motion.  Neurological: She is alert and oriented to person, place, and time.  Skin: Skin is warm and dry. No rash noted. No cyanosis. No jaundice. Nails show no clubbing.    Diagnostic Studies & Laboratory data:     Recent Radiology Findings:   CARDIAC CATHETERIZATION  Result Date: 01/05/2022   Prox LAD lesion is 99% stenosed.   1st Diag lesion is 90% stenosed.   Ost Cx to Prox Cx lesion is 100% stenosed.   Prox RCA lesion is 100% stenosed. Severe coronary calcification and multivessel CAD. The LAD is 99% noticed after giving rise to a optional diagonal like vessel that has 90% ostial stenosis.  The mid LAD has 30 and 70% mid distal stenoses. The left circumflex vessel was totally occluded at the ostium.  There is septal to the distal circumflex collateralization with filling of the vessel up to the proximal AV groove. The right coronary artery is totally occluded proximally.  There is bridging collaterals filling the RCA from the RV marginal branch downward. LVEDP 23 mm. RECOMMENDATION: We will obtain surgical consultation for consideration of CABG revascularization.  The patient has good targets.  Reportedly, the patient had no ischemia on a prior nuclear study and a lot of anatomical findings,  question if there was balanced ischemia at the time of her prior study.  Will resume heparin 8 hours post procedure.  NM Pulmonary Perf and Vent  Result Date: 01/02/2022 CLINICAL DATA:  Pulmonary embolism (PE) suspected, high prob EXAM: NUCLEAR MEDICINE PERFUSION LUNG SCAN TECHNIQUE: Perfusion images were obtained in multiple projections after intravenous injection of radiopharmaceutical. Ventilation scans intentionally deferred if perfusion scan and chest x-ray adequate for interpretation during COVID 19 epidemic. RADIOPHARMACEUTICALS:  4.1 mCi Tc-62m MAA IV COMPARISON:  Chest x-ray 01/02/2022 FINDINGS: Distribution of radiopharmaceutical within both lung fields. Large area of central photopenia related to cardiomegaly. No segmental perfusion defect. IMPRESSION: Low probability for pulmonary embolism. Electronically Signed   By: Davina Poke D.O.   On: 01/02/2022 16:25   DG Chest Port 1 View  Result Date: 01/02/2022 CLINICAL DATA:  Preop EXAM: PORTABLE CHEST 1 VIEW COMPARISON:  Chest x-ray dated December 31, 2021 FINDINGS: Unchanged enlarged cardiac and mediastinal contours. Lungs are clear. No large effusion or evidence of pneumothorax. IMPRESSION: Stable cardiomegaly with no evidence of active disease. Electronically Signed   By: Yetta Glassman M.D.   On: 01/02/2022 08:17   ECHOCARDIOGRAM COMPLETE  Result Date: 01/02/2022    ECHOCARDIOGRAM REPORT   Patient Name:   Jody Nguyen Date of Exam: 01/02/2022 Medical Rec #:  [id number]        Height:       59.5 in Accession #:  [id number]       Weight:       169.8 lb Date of Birth:  1956-09-13         BSA:          1.730 m Patient Age:    36 years         BP:           103/74 mmHg Patient Gender: F                HR:           91 bpm. Exam Location:  Inpatient Procedure: 2D Echo, Cardiac Doppler and Color Doppler Indications:    Chest pain  History:        Patient has prior history of Echocardiogram examinations. CHF,                 Pulmonary HTN; Risk  Factors:Hypertension and Diabetes.                  Mitral Valve: 26 mm prosthetic annuloplasty ring valve is                 present in the mitral position.  Sonographer:    Jyl Heinz Referring Phys: [id number] [place]  1. Left ventricular ejection fraction, by estimation, is 40 to 45%. The left ventricle has mildly decreased function. The left ventricle has no regional wall motion abnormalities. There is moderate left ventricular hypertrophy. Left ventricular diastolic parameters are indeterminate.  2. Right ventricular systolic function is mildly reduced. The right ventricular size is mildly enlarged. There is mildly elevated pulmonary artery systolic pressure. The estimated right ventricular systolic pressure is 42.8 mmHg.  3. Left atrial size was mildly dilated.  4. The mitral valve has been repaired/replaced. No evidence of mitral valve regurgitation. Moderate mitral stenosis. The mean mitral valve gradient is 8.5 mmHg. There is a 26 mm prosthetic annuloplasty ring present in the mitral position.  5. Tricuspid valve regurgitation is severe. Moderate tricuspid stenosis.  6. The aortic valve is normal in structure. Aortic valve regurgitation is not visualized. No aortic stenosis is present.  7. The inferior vena cava is dilated in size with <50% respiratory variability, suggesting right atrial pressure of 15 mmHg. Comparison(s): No significant change from prior study. Prior images reviewed side by side. FINDINGS  Left Ventricle: Left ventricular ejection fraction, by estimation, is 40 to 45%. The left ventricle has mildly decreased function. The left ventricle has no regional wall motion abnormalities. The left ventricular internal cavity size was normal in size. There is moderate left ventricular hypertrophy. Left ventricular diastolic parameters are indeterminate. Right Ventricle: The right ventricular size is mildly enlarged. No increase in right ventricular wall thickness. Right  ventricular systolic function is mildly reduced. There is mildly elevated pulmonary artery systolic pressure. The tricuspid regurgitant  velocity is 2.48 m/s, and with an assumed right atrial pressure of 15 mmHg, the estimated right ventricular systolic pressure is 76.8 mmHg. Left Atrium: Left atrial size was mildly dilated. Right Atrium: Right atrial size was normal in size. Pericardium: There is no evidence of pericardial effusion. Mitral Valve: The mitral valve has been repaired/replaced. There is severe thickening of the mitral valve leaflet(s). There is severe calcification of the mitral valve leaflet(s). Mildly decreased mobility of the mitral valve leaflets. No evidence of mitral valve regurgitation. There is a 26 mm prosthetic annuloplasty ring present in the mitral position. Moderate mitral valve stenosis. MV peak gradient, 14.7 mmHg. The  mean mitral valve gradient is 8.5 mmHg. Tricuspid Valve: The tricuspid valve is normal in structure. Tricuspid valve regurgitation is severe. Moderate tricuspid stenosis. Aortic Valve: The aortic valve is normal in structure. Aortic valve regurgitation is not visualized. Aortic regurgitation PHT measures 359 msec. No aortic stenosis is present. Aortic valve peak gradient measures 5.8 mmHg. Pulmonic Valve: The pulmonic valve was normal in structure. Pulmonic valve regurgitation is trivial. No evidence of pulmonic stenosis. Aorta: The aortic root is normal in size and structure. Venous: The inferior vena cava is dilated in size with less than 50% respiratory variability, suggesting right atrial pressure of 15 mmHg. IAS/Shunts: There is redundancy of the interatrial septum. No atrial level shunt detected by color flow Doppler.  LEFT VENTRICLE PLAX 2D LVIDd:         5.10 cm      Diastology LVIDs:         4.40 cm      LV e' medial:    6.42 cm/s LV PW:         1.70 cm      LV E/e' medial:  24.1 LV IVS:        1.20 cm      LV e' lateral:   7.40 cm/s LVOT diam:     2.00 cm      LV  E/e' lateral: 20.9 LV SV:         47 LV SV Index:   27 LVOT Area:     3.14 cm  LV Volumes (MOD) LV vol d, MOD A2C: 147.0 ml LV vol d, MOD A4C: 95.3 ml LV vol s, MOD A2C: 75.7 ml LV vol s, MOD A4C: 48.8 ml LV SV MOD A2C:     71.3 ml LV SV MOD A4C:     95.3 ml LV SV MOD BP:      59.2 ml RIGHT VENTRICLE            IVC RV Basal diam:  4.50 cm    IVC diam: 2.70 cm RV Mid diam:    4.00 cm RV S prime:     5.98 cm/s TAPSE (M-mode): 0.9 cm LEFT ATRIUM           Index        RIGHT ATRIUM           Index LA diam:      4.80 cm 2.77 cm/m   RA Area:     28.90 cm LA Vol (A2C): 61.3 ml 35.43 ml/m  RA Volume:   109.00 ml 63.00 ml/m LA Vol (A4C): 64.3 ml 37.16 ml/m  AORTIC VALVE AV Area (Vmax): 2.22 cm AV Vmax:        120.00 cm/s AV Peak Grad:   5.8 mmHg LVOT Vmax:      84.70 cm/s LVOT Vmean:     58.200 cm/s LVOT VTI:       0.149 m AI PHT:         359 msec  AORTA Ao Root diam: 2.60 cm Ao Asc diam:  3.30 cm MITRAL VALVE                TRICUSPID VALVE MV Area (PHT): 3.01 cm     TR Peak grad:   24.6 mmHg MV Area VTI:   1.08 cm     TR Vmax:        248.00 cm/s MV Peak grad:  14.7 mmHg MV Mean grad:  8.5 mmHg     SHUNTS MV  Vmax:       1.92 m/s     Systemic VTI:  0.15 m MV Vmean:      140.0 cm/s   Systemic Diam: 2.00 cm MV Decel Time: 252 msec MV E velocity: 155.00 cm/s MV A velocity: 179.00 cm/s MV E/A ratio:  0.87 Candee Furbish MD Electronically signed by Candee Furbish MD Signature Date/Time: 01/02/2022/2:17:58 PM    Final      I have independently reviewed the above radiologic studies and discussed with the patient   Recent Lab Findings: Lab Results  Component Value Date   WBC 3.9 (L) 01/12/2022   HGB 10.4 (L) 01/08/2022   HCT 31.7 (L) 01/07/2022   PLT 90 (L) 01/02/2022   GLUCOSE 128 (H) 12/29/2021   CHOL 227 (H) 12/08/2021   TRIG 103 12/08/2021   HDL 50 12/08/2021   LDLCALC 155 (H) 12/08/2021   ALT 24 01/01/2022   AST 23 01/01/2022   NA 135 01/09/2022   K 3.9 01/16/2022   CL 94 (L) 01/16/2022   CREATININE 10.38  (H) 01/22/2022   BUN 49 (H) 12/30/2021   CO2 24 01/20/2022   TSH 2.67 12/08/2021   INR 1.1 08/28/2021   HGBA1C 6.1 (H) 12/08/2021      Assessment / Plan: Severe multivessel coronary artery disease in the setting of previous mitral valve repair, pulmonary hypertension and chronic diastolic congestive heart failure  SVT History of pulmonary embolus End-stage renal disease on hemodialysis Chronic anemia Previous CVA with no residual deficit Diverticulitis Type 2 diabetes mellitus Hyperlipidemia Hypertension History of chronic pericardial effusion   Plan: She would benefit from coronary artery surgical revascularization but this would be considered high risk due to multiple comorbidities as well as previous events of past surgical intervention related to her mitral valve repair.    I  spent 30 minutes counseling the patient face to face.   [name], PA-C  01/16/2022 2:56 PM  I have seen and examined Jody Nguyen. I reviewed the chart, her old operative report, cath films and echo report.  66 yo woman with multiple medical problems including ESRD on hemodialysis, diabetes, mitral valve repair with moderate stenosis, pulmonary hypertension, SVT, hypertension, hyperlipidemia, and CVA. Presents with CP and found to have severe 3 vessel CAD with CTO of RCA and circumflex and tight stenoses in LAD and diagonal. Unfortunately no right heart cath done. Echo showed EF 29-47%, mild RV systolic dysfunction, moderate MS and severe TR.  CABG is indicated for survival benefit albeit at very high risk.   Will discuss TR with Cardiology  I discussed the general nature of the procedure, the need for general anesthesia, the incisions to be used, and the use of cardiopulmonary bypass with Mrs Highland and her husband. We discussed the expected hospital stay, overall recovery and short and long term outcomes. I infomed them of the indications, risks, benefits and alternatives.  They understand the  risks include, but are not limited to death, stroke, MI, DVT/PE, bleeding, possible need for transfusion, infections,other organ system dysfunction including respiratory or GI complications.   They understand she is a very high risk patient.  Friday is likely the earliest we can get OR time  Remo Lipps C. Roxan Hockey, MD [facility] [phone]

## 2022-01-03 NOTE — Progress Notes (Signed)
Pt receives out-pt HD at [facility] on MWF with a 5:35 chair time. Will assist as needed.  Melven Sartorius Renal Navigator [phone]

## 2022-01-03 NOTE — Progress Notes (Signed)
[name] BRIEF NOTE  Jody Nguyen 1956-10-12 [id number]  Patient infiltrated when moving her arm during HD.  Nurse unable to cannulate higher due to size of infiltration.  Ice applied. Labs and volume status stable.  Will write orders for HD 1st shift tomorrow.    Jen Mow, PA-C [employer]

## 2022-01-03 NOTE — Plan of Care (Signed)
°  Problem: Education: Goal: Knowledge of General Education information will improve Description: Including pain rating scale, medication(s)/side effects and non-pharmacologic comfort measures Outcome: Progressing   Problem: Clinical Measurements: Goal: Ability to maintain clinical measurements within normal limits will improve Outcome: Progressing   Problem: Clinical Measurements: Goal: Diagnostic test results will improve Outcome: Progressing   Problem: Clinical Measurements: Goal: Respiratory complications will improve Outcome: Progressing   Problem: Clinical Measurements: Goal: Cardiovascular complication will be avoided Outcome: Progressing   Problem: Activity: Goal: Risk for activity intolerance will decrease Outcome: Progressing   Problem: Pain Managment: Goal: General experience of comfort will improve Outcome: Progressing   Problem: Safety: Goal: Ability to remain free from injury will improve Outcome: Progressing

## 2022-01-03 NOTE — Progress Notes (Signed)
Progress Note  Patient Name: Jody Nguyen Date of Encounter: 01/20/2022  [facility] Cardiologist: [name], MD   Subjective   No CP or dyspnea  Inpatient Medications    Scheduled Meds:  aspirin EC  81 mg Oral Daily   cinacalcet  90 mg Oral Once per day on Mon Wed Fri   doxercalciferol  9 mcg Intravenous Q M,W,F-HD   insulin aspart  0-5 Units Subcutaneous QHS   insulin aspart  0-6 Units Subcutaneous TID WC   latanoprost  1 drop Both Eyes QHS   macitentan  10 mg Oral Daily   metoprolol tartrate  25 mg Oral BID   rosuvastatin  20 mg Oral QHS   sildenafil  40 mg Oral TID   sodium chloride flush  3 mL Intravenous Q12H   Continuous Infusions:  sodium chloride     sodium chloride 10 mL/hr at 01/23/2022 0431   heparin 1,200 Units/hr (01/05/2022 0028)   PRN Meds: sodium chloride, acetaminophen **OR** acetaminophen, hydrALAZINE, pantoprazole, sodium chloride flush   Vital Signs    Vitals:   01/02/22 0844 01/02/22 1322 01/02/22 2307 01/23/2022 0700  BP: 100/69 90/67 103/73 (!) 88/53  Pulse: 93 91 91 72  Resp:  20 19 20   Temp:  (!) 97.5 F (36.4 C) 98.2 F (36.8 C) 98 F (36.7 C)  TempSrc:  Oral Oral Oral  SpO2: 95% 95% 93% 94%  Weight:      Height:        Intake/Output Summary (Last 24 hours) at 01/05/2022 0809 Last data filed at 01/05/2022 0100 Gross per 24 hour  Intake 367.88 ml  Output --  Net 367.88 ml   Last 3 Weights 01/01/2022 01/07/2022 12/29/2021  Weight (lbs) 169 lb 12.1 oz 169 lb 169 lb 12.8 oz  Weight (kg) 77 kg 76.658 kg 77.021 kg      Telemetry    Sinus - Personally Reviewed  Physical Exam   GEN: No acute distress.   Neck: No JVD Cardiac: RRR, no murmurs, rubs, or gallops.  Respiratory: Clear to auscultation bilaterally. GI: Soft, nontender, non-distended  MS: No edema Neuro:  Nonfocal  Psych: Normal affect   Labs    High Sensitivity Troponin:   Recent Labs  Lab 01/02/2022 1853 01/10/2022 2052 01/01/22 0625 01/01/22 1211  01/01/22 1402  TROPONINIHS 210* 201* 194* 189* 207*     Chemistry Recent Labs  Lab 01/01/22 0625 01/01/22 1402 01/02/22 0704 01/14/2022 0415  NA 137  --  134* 135  K 3.8  --  3.7 3.9  CL 95*  --  92* 94*  CO2 24  --  24 24  GLUCOSE 112*  --  109* 128*  BUN 30*  --  38* 49*  CREATININE 7.94*  --  9.15* 10.38*  CALCIUM 8.8*  --  8.6* 7.5*  MG 2.3  --   --   --   PROT  --  7.7  --   --   ALBUMIN  --  3.8 3.4*  --   AST  --  23  --   --   ALT  --  24  --   --   ALKPHOS  --  128*  --   --   BILITOT  --  0.8  --   --   GFRNONAA 5*  --  4* 4*  ANIONGAP 18*  --  18* 17*     Hematology Recent Labs  Lab 01/05/2022 1853 01/02/22 0704 01/09/2022 0415  WBC 3.8* 4.4 3.9*  RBC 4.01 3.85* 3.74*  HGB 10.7* 10.8* 10.4*  HCT 34.9* 32.7* 31.7*  MCV 87.0 84.9 84.8  MCH 26.7 28.1 27.8  MCHC 30.7 33.0 32.8  RDW 21.9* 21.2* 21.2*  PLT 130* 118* 90*   BNP Recent Labs  Lab 01/01/22 0941  BNP 1,432.4*    DDimer  Recent Labs  Lab 01/01/22 0940  DDIMER 1.22*     Radiology    NM Pulmonary Perf and Vent  Result Date: 01/02/2022 CLINICAL DATA:  Pulmonary embolism (PE) suspected, high prob EXAM: NUCLEAR MEDICINE PERFUSION LUNG SCAN TECHNIQUE: Perfusion images were obtained in multiple projections after intravenous injection of radiopharmaceutical. Ventilation scans intentionally deferred if perfusion scan and chest x-ray adequate for interpretation during COVID 19 epidemic. RADIOPHARMACEUTICALS:  4.1 mCi Tc-25m MAA IV COMPARISON:  Chest x-ray 01/02/2022 FINDINGS: Distribution of radiopharmaceutical within both lung fields. Large area of central photopenia related to cardiomegaly. No segmental perfusion defect. IMPRESSION: Low probability for pulmonary embolism. Electronically Signed   By: Davina Poke D.O.   On: 01/02/2022 16:25   DG Chest Port 1 View  Result Date: 01/02/2022 CLINICAL DATA:  Preop EXAM: PORTABLE CHEST 1 VIEW COMPARISON:  Chest x-ray dated December 31, 2021 FINDINGS:  Unchanged enlarged cardiac and mediastinal contours. Lungs are clear. No large effusion or evidence of pneumothorax. IMPRESSION: Stable cardiomegaly with no evidence of active disease. Electronically Signed   By: Yetta Glassman M.D.   On: 01/02/2022 08:17   ECHOCARDIOGRAM COMPLETE  Result Date: 01/02/2022    ECHOCARDIOGRAM REPORT   Patient Name:   Jody Nguyen Date of Exam: 01/02/2022 Medical Rec #:  [id number]        Height:       59.5 in Accession #:    [id number]       Weight:       169.8 lb Date of Birth:  November 20, 1956         BSA:          1.730 m Patient Age:    66 years         BP:           103/74 mmHg Patient Gender: F                HR:           91 bpm. Exam Location:  Inpatient Procedure: 2D Echo, Cardiac Doppler and Color Doppler Indications:    Chest pain  History:        Patient has prior history of Echocardiogram examinations. CHF,                 Pulmonary HTN; Risk Factors:Hypertension and Diabetes.                  Mitral Valve: 26 mm prosthetic annuloplasty ring valve is                 present in the mitral position.  Sonographer:    Jyl Heinz Referring Phys: [id number] [place]  1. Left ventricular ejection fraction, by estimation, is 40 to 45%. The left ventricle has mildly decreased function. The left ventricle has no regional wall motion abnormalities. There is moderate left ventricular hypertrophy. Left ventricular diastolic parameters are indeterminate.  2. Right ventricular systolic function is mildly reduced. The right ventricular size is mildly enlarged. There is mildly elevated pulmonary artery systolic pressure. The estimated right ventricular systolic pressure is 26.3 mmHg.  3. Left atrial size  was mildly dilated.  4. The mitral valve has been repaired/replaced. No evidence of mitral valve regurgitation. Moderate mitral stenosis. The mean mitral valve gradient is 8.5 mmHg. There is a 26 mm prosthetic annuloplasty ring present in the mitral position.  5.  Tricuspid valve regurgitation is severe. Moderate tricuspid stenosis.  6. The aortic valve is normal in structure. Aortic valve regurgitation is not visualized. No aortic stenosis is present.  7. The inferior vena cava is dilated in size with <50% respiratory variability, suggesting right atrial pressure of 15 mmHg. Comparison(s): No significant change from prior study. Prior images reviewed side by side. FINDINGS  Left Ventricle: Left ventricular ejection fraction, by estimation, is 40 to 45%. The left ventricle has mildly decreased function. The left ventricle has no regional wall motion abnormalities. The left ventricular internal cavity size was normal in size. There is moderate left ventricular hypertrophy. Left ventricular diastolic parameters are indeterminate. Right Ventricle: The right ventricular size is mildly enlarged. No increase in right ventricular wall thickness. Right ventricular systolic function is mildly reduced. There is mildly elevated pulmonary artery systolic pressure. The tricuspid regurgitant  velocity is 2.48 m/s, and with an assumed right atrial pressure of 15 mmHg, the estimated right ventricular systolic pressure is 62.5 mmHg. Left Atrium: Left atrial size was mildly dilated. Right Atrium: Right atrial size was normal in size. Pericardium: There is no evidence of pericardial effusion. Mitral Valve: The mitral valve has been repaired/replaced. There is severe thickening of the mitral valve leaflet(s). There is severe calcification of the mitral valve leaflet(s). Mildly decreased mobility of the mitral valve leaflets. No evidence of mitral valve regurgitation. There is a 26 mm prosthetic annuloplasty ring present in the mitral position. Moderate mitral valve stenosis. MV peak gradient, 14.7 mmHg. The mean mitral valve gradient is 8.5 mmHg. Tricuspid Valve: The tricuspid valve is normal in structure. Tricuspid valve regurgitation is severe. Moderate tricuspid stenosis. Aortic Valve: The  aortic valve is normal in structure. Aortic valve regurgitation is not visualized. Aortic regurgitation PHT measures 359 msec. No aortic stenosis is present. Aortic valve peak gradient measures 5.8 mmHg. Pulmonic Valve: The pulmonic valve was normal in structure. Pulmonic valve regurgitation is trivial. No evidence of pulmonic stenosis. Aorta: The aortic root is normal in size and structure. Venous: The inferior vena cava is dilated in size with less than 50% respiratory variability, suggesting right atrial pressure of 15 mmHg. IAS/Shunts: There is redundancy of the interatrial septum. No atrial level shunt detected by color flow Doppler.  LEFT VENTRICLE PLAX 2D LVIDd:         5.10 cm      Diastology LVIDs:         4.40 cm      LV e' medial:    6.42 cm/s LV PW:         1.70 cm      LV E/e' medial:  24.1 LV IVS:        1.20 cm      LV e' lateral:   7.40 cm/s LVOT diam:     2.00 cm      LV E/e' lateral: 20.9 LV SV:         47 LV SV Index:   27 LVOT Area:     3.14 cm  LV Volumes (MOD) LV vol d, MOD A2C: 147.0 ml LV vol d, MOD A4C: 95.3 ml LV vol s, MOD A2C: 75.7 ml LV vol s, MOD A4C: 48.8 ml LV SV MOD A2C:  71.3 ml LV SV MOD A4C:     95.3 ml LV SV MOD BP:      59.2 ml RIGHT VENTRICLE            IVC RV Basal diam:  4.50 cm    IVC diam: 2.70 cm RV Mid diam:    4.00 cm RV S prime:     5.98 cm/s TAPSE (M-mode): 0.9 cm LEFT ATRIUM           Index        RIGHT ATRIUM           Index LA diam:      4.80 cm 2.77 cm/m   RA Area:     28.90 cm LA Vol (A2C): 61.3 ml 35.43 ml/m  RA Volume:   109.00 ml 63.00 ml/m LA Vol (A4C): 64.3 ml 37.16 ml/m  AORTIC VALVE AV Area (Vmax): 2.22 cm AV Vmax:        120.00 cm/s AV Peak Grad:   5.8 mmHg LVOT Vmax:      84.70 cm/s LVOT Vmean:     58.200 cm/s LVOT VTI:       0.149 m AI PHT:         359 msec  AORTA Ao Root diam: 2.60 cm Ao Asc diam:  3.30 cm MITRAL VALVE                TRICUSPID VALVE MV Area (PHT): 3.01 cm     TR Peak grad:   24.6 mmHg MV Area VTI:   1.08 cm     TR Vmax:         248.00 cm/s MV Peak grad:  14.7 mmHg MV Mean grad:  8.5 mmHg     SHUNTS MV Vmax:       1.92 m/s     Systemic VTI:  0.15 m MV Vmean:      140.0 cm/s   Systemic Diam: 2.00 cm MV Decel Time: 252 msec MV E velocity: 155.00 cm/s MV A velocity: 179.00 cm/s MV E/A ratio:  0.87 Candee Furbish MD Electronically signed by Candee Furbish MD Signature Date/Time: 01/02/2022/2:17:58 PM    Final     Patient Profile     66 year old female with past medical history of previous mitral valve repair, pulmonary hypertension, nonobstructive coronary disease, chronic diastolic congestive heart failure, hypertension, hyperlipidemia, end-stage renal disease for evaluation of chest pain and SVT.  Patient had a nuclear study in October that showed no ischemia.  Echocardiogram October 2022 showed ejection fraction 50 to 55%, mild RVE/RV dysfunction, previous mitral valve repair with mean gradient 9 mmHg suggestive of moderate mitral stenosis, mild mitral regurgitation and moderate tricuspid regurgitation.  Right heart catheterization at [facility] October 2022 showed PA pressure 50/22 with pulmonary capillary wedge pressure 18.  Patient is complaining of intermittent chest pain.  It is substernal radiating to her back.  It is not exertional.  Typically last approximately 10 minutes.  It is associated with heart racing.  She otherwise has some fatigue and dyspnea on exertion but no orthopnea, PND or pedal edema.  She has been admitted and cardiology asked to evaluate.  Assessment & Plan    1 chest pain-symptoms seem to occur predominantly with SVT.  However she has been to the emergency room 3 times recently.  Her LV function is also reduced compared to previous.  She also had a negative nuclear study recently.  I feel definitive evaluation is warranted.  Plan is for cardiac catheterization  later today.  The risk and benefits including myocardial infarction, CVA and death discussed and she agrees to proceed.    2 SVT-patient appears to have  had episodes of SVT with associated chest pain.  Blood pressure has been borderline and I have therefore changed carvedilol to metoprolol.  Blood pressure remains borderline so we will decrease to 12.5 mg twice daily.  Continue to follow on telemetry.     3 pulmonary hypertension-this is longstanding.  We will continue home dose of revatio and opsumit.  Patient is followed at [facility].   4 history of pulmonary embolus-resume apixaban once all procedures complete.   5 history of mitral valve repair-follow-up echocardiogram shows no mitral regurgitation and moderate mitral stenosis.   6 end-stage renal disease-dialysis per nephrology.  For questions or updates, please contact [place] Please consult [email or web address] for contact info under        Signed, [name], MD  01/16/2022, 8:09 AM

## 2022-01-03 NOTE — Progress Notes (Signed)
Site area: rt groin arterial sheath Site Prior to Removal:  Level 0 Pressure Applied For: 20 minutes Manual:   yes Patient Status During Pull:  stable Post Pull Site:  Level 0 Post Pull Instructions Given:  yes Post Pull Pulses Present: rt pt dopplered Dressing Applied:  gauze and tegaderm Bedrest begins @ 1220 Comments:

## 2022-01-03 NOTE — Progress Notes (Signed)
[facility] Progress Note   Subjective:   Patient seen and examined in room.  Sitting in bedside chair, waiting to go for heart cath.  No chest pain this AM.  Denies SOB, orthopnea, abdominal pain, n/v/d, dizziness and fatigue.   Objective Vitals:   01/02/22 1322 01/02/22 2307 01/16/2022 0700 12/29/2021 1052  BP: 90/67 103/73 (!) 88/53   Pulse: 91 91 72   Resp: 20 19 20    Temp: (!) 97.5 F (36.4 C) 98.2 F (36.8 C) 98 F (36.7 C)   TempSrc: Oral Oral Oral   SpO2: 95% 93% 94% 96%  Weight:      Height:       Physical Exam General:well appearing female sitting in bedside chair in NAD Heart:RRR, no mrg Lungs:CTAB, nml WOB on 2L via [place] Abdomen:soft, NTND Extremities:no LE edema Dialysis Access: RU AVF +b/t   Filed Weights   01/10/2022 1820 01/01/22 0153  Weight: 76.7 kg 77 kg    Intake/Output Summary (Last 24 hours) at 01/24/2022 1112 Last data filed at 01/11/2022 0100 Gross per 24 hour  Intake 367.88 ml  Output --  Net 367.88 ml    Additional Objective Labs: Basic Metabolic Panel: Recent Labs  Lab 01/01/22 0625 01/02/22 0704 01/02/2022 0415  NA 137 134* 135  K 3.8 3.7 3.9  CL 95* 92* 94*  CO2 24 24 24   GLUCOSE 112* 109* 128*  BUN 30* 38* 49*  CREATININE 7.94* 9.15* 10.38*  CALCIUM 8.8* 8.6* 7.5*  PHOS  --  7.5*  --    Liver Function Tests: Recent Labs  Lab 01/01/22 1402 01/02/22 0704  AST 23  --   ALT 24  --   ALKPHOS 128*  --   BILITOT 0.8  --   PROT 7.7  --   ALBUMIN 3.8 3.4*   CBC: Recent Labs  Lab 12/28/21 1101 01/23/2022 1853 01/02/22 0704 01/05/2022 0415  WBC 4.1 3.8* 4.4 3.9*  HGB 11.2* 10.7* 10.8* 10.4*  HCT 35.6* 34.9* 32.7* 31.7*  MCV 85.4 87.0 84.9 84.8  PLT 191 130* 118* 90*   Blood Culture    Component Value Date/Time   SDES  12/28/2021 1101    BLOOD LEFT ARM Performed at [facility], [address] [place], [place] [address]    [facility]  12/28/2021 1101    BOTTLES DRAWN AEROBIC AND  ANAEROBIC Blood Culture adequate volume Performed at [facility], [address] [place], [place] [address]    CULT  12/28/2021 1101    NO GROWTH 5 DAYS Performed at [facility], [place] [address] [place], [place] [address]    REPTSTATUS 01/02/2022 FINAL 12/28/2021 1101    Recent Labs  Lab 01/02/22 0729 01/02/22 1110 01/02/22 1608 01/02/22 2204 01/18/2022 0808  GLUCAP 110* 125* 122* 144* 111*    Studies/Results: NM Pulmonary Perf and Vent  Result Date: 01/02/2022 CLINICAL DATA:  Pulmonary embolism (PE) suspected, high prob EXAM: NUCLEAR MEDICINE PERFUSION LUNG SCAN TECHNIQUE: Perfusion images were obtained in multiple projections after intravenous injection of radiopharmaceutical. Ventilation scans intentionally deferred if perfusion scan and chest x-ray adequate for interpretation during COVID 19 epidemic. RADIOPHARMACEUTICALS:  4.1 mCi Tc-98m MAA IV COMPARISON:  Chest x-ray 01/02/2022 FINDINGS: Distribution of radiopharmaceutical within both lung fields. Large area of central photopenia related to cardiomegaly. No segmental perfusion defect. IMPRESSION: Low probability for pulmonary embolism. Electronically Signed   By: Davina Poke D.O.   On: 01/02/2022 16:25   DG Chest [facility]  Result Date: 01/02/2022 CLINICAL DATA:  Preop EXAM: PORTABLE CHEST 1 VIEW COMPARISON:  Chest x-ray dated December 31, 2021 FINDINGS: Unchanged enlarged cardiac and mediastinal contours. Lungs are clear. No large effusion or evidence of pneumothorax. IMPRESSION: Stable cardiomegaly with no evidence of active disease. Electronically Signed   By: Yetta Glassman M.D.   On: 01/02/2022 08:17   ECHOCARDIOGRAM COMPLETE  Result Date: 01/02/2022    ECHOCARDIOGRAM REPORT   Patient Name:   Jody Nguyen Date of Exam: 01/02/2022 Medical Rec #:  [id number]        Height:       59.5 in Accession #:    [id number]       Weight:       169.8 lb Date of Birth:  Mar 12, 1956         BSA:          1.730  m Patient Age:    66 years         BP:           103/74 mmHg Patient Gender: F                HR:           91 bpm. Exam Location:  Inpatient Procedure: 2D Echo, Cardiac Doppler and Color Doppler Indications:    Chest pain  History:        Patient has prior history of Echocardiogram examinations. CHF,                 Pulmonary HTN; Risk Factors:Hypertension and Diabetes.                  Mitral Valve: 26 mm prosthetic annuloplasty ring valve is                 present in the mitral position.  Sonographer:    Jyl Heinz Referring Phys: [id number] [place]  1. Left ventricular ejection fraction, by estimation, is 40 to 45%. The left ventricle has mildly decreased function. The left ventricle has no regional wall motion abnormalities. There is moderate left ventricular hypertrophy. Left ventricular diastolic parameters are indeterminate.  2. Right ventricular systolic function is mildly reduced. The right ventricular size is mildly enlarged. There is mildly elevated pulmonary artery systolic pressure. The estimated right ventricular systolic pressure is 93.2 mmHg.  3. Left atrial size was mildly dilated.  4. The mitral valve has been repaired/replaced. No evidence of mitral valve regurgitation. Moderate mitral stenosis. The mean mitral valve gradient is 8.5 mmHg. There is a 26 mm prosthetic annuloplasty ring present in the mitral position.  5. Tricuspid valve regurgitation is severe. Moderate tricuspid stenosis.  6. The aortic valve is normal in structure. Aortic valve regurgitation is not visualized. No aortic stenosis is present.  7. The inferior vena cava is dilated in size with <50% respiratory variability, suggesting right atrial pressure of 15 mmHg. Comparison(s): No significant change from prior study. Prior images reviewed side by side. FINDINGS  Left Ventricle: Left ventricular ejection fraction, by estimation, is 40 to 45%. The left ventricle has mildly decreased function. The left  ventricle has no regional wall motion abnormalities. The left ventricular internal cavity size was normal in size. There is moderate left ventricular hypertrophy. Left ventricular diastolic parameters are indeterminate. Right Ventricle: The right ventricular size is mildly enlarged. No increase in right ventricular wall thickness. Right ventricular systolic function is mildly reduced. There is mildly elevated pulmonary artery systolic pressure. The tricuspid regurgitant  velocity is 2.48 m/s, and with an assumed right atrial pressure of 15 mmHg, the estimated right ventricular systolic pressure is 75.6 mmHg. Left Atrium: Left atrial size was mildly dilated. Right Atrium: Right atrial size was normal in size. Pericardium: There is no evidence of pericardial effusion. Mitral Valve: The mitral valve has been repaired/replaced. There is severe thickening of the mitral valve leaflet(s). There is severe calcification of the mitral valve leaflet(s). Mildly decreased mobility of the mitral valve leaflets. No evidence of mitral valve regurgitation. There is a 26 mm prosthetic annuloplasty ring present in the mitral position. Moderate mitral valve stenosis. MV peak gradient, 14.7 mmHg. The mean mitral valve gradient is 8.5 mmHg. Tricuspid Valve: The tricuspid valve is normal in structure. Tricuspid valve regurgitation is severe. Moderate tricuspid stenosis. Aortic Valve: The aortic valve is normal in structure. Aortic valve regurgitation is not visualized. Aortic regurgitation PHT measures 359 msec. No aortic stenosis is present. Aortic valve peak gradient measures 5.8 mmHg. Pulmonic Valve: The pulmonic valve was normal in structure. Pulmonic valve regurgitation is trivial. No evidence of pulmonic stenosis. Aorta: The aortic root is normal in size and structure. Venous: The inferior vena cava is dilated in size with less than 50% respiratory variability, suggesting right atrial pressure of 15 mmHg. IAS/Shunts: There is  redundancy of the interatrial septum. No atrial level shunt detected by color flow Doppler.  LEFT VENTRICLE PLAX 2D LVIDd:         5.10 cm      Diastology LVIDs:         4.40 cm      LV e' medial:    6.42 cm/s LV PW:         1.70 cm      LV E/e' medial:  24.1 LV IVS:        1.20 cm      LV e' lateral:   7.40 cm/s LVOT diam:     2.00 cm      LV E/e' lateral: 20.9 LV SV:         47 LV SV Index:   27 LVOT Area:     3.14 cm  LV Volumes (MOD) LV vol d, MOD A2C: 147.0 ml LV vol d, MOD A4C: 95.3 ml LV vol s, MOD A2C: 75.7 ml LV vol s, MOD A4C: 48.8 ml LV SV MOD A2C:     71.3 ml LV SV MOD A4C:     95.3 ml LV SV MOD BP:      59.2 ml RIGHT VENTRICLE            IVC RV Basal diam:  4.50 cm    IVC diam: 2.70 cm RV Mid diam:    4.00 cm RV S prime:     5.98 cm/s TAPSE (M-mode): 0.9 cm LEFT ATRIUM           Index        RIGHT ATRIUM           Index LA diam:      4.80 cm 2.77 cm/m   RA Area:     28.90 cm LA Vol (A2C): 61.3 ml 35.43 ml/m  RA Volume:   109.00 ml 63.00 ml/m LA Vol (A4C): 64.3 ml 37.16 ml/m  AORTIC VALVE AV Area (Vmax): 2.22 cm AV Vmax:        120.00 cm/s AV Peak Grad:   5.8 mmHg LVOT Vmax:      84.70 cm/s LVOT Vmean:     58.200  cm/s LVOT VTI:       0.149 m AI PHT:         359 msec  AORTA Ao Root diam: 2.60 cm Ao Asc diam:  3.30 cm MITRAL VALVE                TRICUSPID VALVE MV Area (PHT): 3.01 cm     TR Peak grad:   24.6 mmHg MV Area VTI:   1.08 cm     TR Vmax:        248.00 cm/s MV Peak grad:  14.7 mmHg MV Mean grad:  8.5 mmHg     SHUNTS MV Vmax:       1.92 m/s     Systemic VTI:  0.15 m MV Vmean:      140.0 cm/s   Systemic Diam: 2.00 cm MV Decel Time: 252 msec MV E velocity: 155.00 cm/s MV A velocity: 179.00 cm/s MV E/A ratio:  0.87 Candee Furbish MD Electronically signed by Candee Furbish MD Signature Date/Time: 01/02/2022/2:17:58 PM    Final     Medications:  sodium chloride     sodium chloride 10 mL/hr at 12/30/2021 0431   heparin 1,200 Units/hr (01/20/2022 0028)    [MAR Hold] aspirin EC  81 mg Oral Daily    [MAR Hold] cinacalcet  90 mg Oral Once per day on Mon Wed Fri   [facility] Hold] doxercalciferol  9 mcg Intravenous Q M,W,F-HD   [MAR Hold] insulin aspart  0-5 Units Subcutaneous QHS   [MAR Hold] insulin aspart  0-6 Units Subcutaneous TID WC   [MAR Hold] latanoprost  1 drop Both Eyes QHS   [MAR Hold] macitentan  10 mg Oral Daily   [MAR Hold] metoprolol tartrate  12.5 mg Oral BID   [MAR Hold] rosuvastatin  20 mg Oral QHS   [MAR Hold] sildenafil  40 mg Oral TID   [MAR Hold] sodium chloride flush  3 mL Intravenous Q12H    Dialysis Orders: MWF at [facility] 3:15hr, 450/A1.5, EDW 75.5kg, 2K/2Ca, UFP #2, AVF, heparin 2500 unit bolus - Hectoral 79mcg IV q HD - Sensipar 90mg  PO q HD - Mircera 160mcg IV q 2 weeks (ordered, not yet given. Last Mircera 100 on 12/07/21)   Assessment/Plan:  Intermittent chest pain with radiation to back/dyspnea: Trops elevated, but trend is stable. No PE on VQ scan.  On heparin drip. Episodes of SVT, changed to metoprolol per cardiology and may require ablation if symptoms not controlled.  LHC today.  Work-up/plan per PMD/cardiology.  ESRD:  Continue HD on MWF schedule - HD moved from yesterday to today due to SVT when initally planned to get patient, followed by increase patient load throughout the rest of the day.  HD today off schedule after LHC. K has been low, using 3K bath.  Resume regular schedule tomorrow.   Hypertension/volume: BP soft this AM.  Cardiology changed carvedilol to metoprolol and decreased dose to 12.5mg  BID.  Does not appear volume overload but having new O2 requirement.  UF as tolerated.   Anemia: Hgb 10.4 - hold ESA for now.  Metabolic bone disease: Ca ok, Phos elevated. Continue home binders, VDRA, sensipar. Nutrition - renal diet w/fluid restrictions.   Pulm HTN:  Followed at [facility], on Revatio and opsumit  T2DM CAD/MVR 2016 - cardiology following. LHC today.   Jen Mow, PA-C [facility] 01/02/2022,11:12 AM  LOS: 2 days

## 2022-01-03 NOTE — Assessment & Plan Note (Addendum)
-  Left heart catheterization 01/11/2022 with severe coronary calcification and multivessel CAD with LAD 99%, first diagonal 90%, ostial circumflex to proximal circumflex 100%, proximal RCA 100% stenosed. -Cardiology to consult CTS for possible CABG; Patient is a candidate but High Riskc -CABG is scheduled for Friday 01/10/2022 and in the AM -Continue Aspirin 81 mg po daily, Rousvastatin 20 mg po qHS, Metoprol Tartrate 12.5 mg po BID  Heparin drip

## 2022-01-03 NOTE — Progress Notes (Signed)
ANTICOAGULATION CONSULT NOTE - Follow Up Consult  Pharmacy Consult for heparin  Indication: r/o acute PE   Allergies  Allergen Reactions   Minoxidil Other (See Comments)    Pericardial effusion   Lipitor [Atorvastatin] Other (See Comments)    MYALGIAS > "pain in legs" Tolerates rosuvastatin   Morphine And Related Itching   Ace Inhibitors Other (See Comments)    REACTION: "not sure...think it made me drowsy all the time"    Patient Measurements: Height: 4' 11.5" (151.1 cm) Weight: 77 kg (169 lb 12.1 oz) IBW/kg (Calculated) : 44.35 Heparin Dosing Weight: 61.9 kg  Vital Signs: Temp: 98 F (36.7 C) (02/07 0700) Temp Source: Oral (02/07 0700) BP: 115/40 (02/07 1225) Pulse Rate: 73 (02/07 1225)  Labs: Recent Labs    01/16/2022 1853 01/05/2022 2052 01/01/22 0625 01/01/22 1211 01/01/22 1402 01/01/22 1402 01/01/22 2230 01/02/22 0704 12/28/2021 0415  HGB 10.7*  --   --   --   --   --   --  10.8* 10.4*  HCT 34.9*  --   --   --   --   --   --  32.7* 31.7*  PLT 130*  --   --   --   --   --   --  118* 90*  APTT  --   --   --   --  88*  --   --   --   --   HEPARINUNFRC  --   --   --   --  0.28*   < > 0.44 0.42 0.54  CREATININE 7.25*  --  7.94*  --   --   --   --  9.15* 10.38*  TROPONINIHS 210*   < > 194* 189* 207*  --   --   --   --    < > = values in this interval not displayed.     Estimated Creatinine Clearance: 4.8 mL/min (A) (by C-G formula based on SCr of 10.38 mg/dL (H)).  Assessment: 67yo female c/o CP with intermittent SOB, admitted for acute hypoxic respiratory failure, to transition from Eliquis for concern for acute PE given hypoxia and tachycardia, awaiting bedside echo and VQ scan; pt missed Saturday doses of Eliquis, last taken Friday pm per patient.  Heparin level is therapeutic, H/H stable, pltc down need to watch. VQ scan negative for PE, cath planned today.  Goal of Therapy:  Heparin level 0.3-0.7 units/ml Monitor platelets by anticoagulation protocol:  Yes   Plan:  Continue heparin 1200 units/h Daily heparin level and CBC  ADDENDUM 12:36: Pt s/p LHC with mvCAD, planning CABG evaluation with CT surgery. Heparin to resume in 8h after sheath removal (~1230).  Plan: Resume heparin 1200 units/h no bolus at 2030 Repeat HL with am labs  Arrie Senate, PharmD, BCPS, [facility] Clinical Pharmacist [phone] Please check AMION for all Home numbers 01/07/2022

## 2022-01-03 NOTE — Progress Notes (Signed)
TCTS consulted for CABG evaluation. °

## 2022-01-03 NOTE — Progress Notes (Signed)
Progress Note   Patient: Jody Nguyen WJX:[id number] DOB: [birth year] DOA: 01/24/2022     2 DOS: the patient was seen and examined on 01/20/2022   Brief hospital course: Jody Nguyen is a 66 year old female with past medical history significant for pulmonary hypertension followed by cardiology at [facility], chronic diastolic congestive heart failure, previous MV repair, nonobstructive CAD per cath done in 2016, hypertension, hyperlipidemia, ESRD on HD MWF, CVA, type 2 diabetes, history of PE on Eliquis who presented to [facility] ED on 2/4 with complaint of persistent chest pain. Patient states she is having episodes of chest pain this past week.  Reports nonexertional pain all across her chest with radiation to the back and throbbing in nature.  Each episode lasts about 10 minutes.  She has had episodes of chest pain after dialysis but having it even on nondialysis days.  Associated with nausea and dyspnea.  Denies history of blood clots. Last dialysis session was on Friday and she still makes urine.  She was seen in the ED on 12/28/2021 for chest pain and troponins were elevated at 100 but flat and consistent with previous.  She was slightly hypotensive and given fluid bolus.  EKG with no acute changes.  Seen by cardiology on 12/29/2021 and it was felt that her chest pain and troponin elevation were due to sudden fluid volume shift as chest pain occurred during dialysis.  Plan was to monitor her symptoms and discuss settings with HD prior to pursuing invasive cardiac catheterization.  In the ED, WBC 3.8 (slightly low on recent labs as well), hemoglobin 10.7 (stable), platelet count 130k.  Sodium 137, potassium 3.2, chloride 91, bicarb 32, BUN 29, creatinine 7.2, glucose 119.  EKG showing sinus rhythm, new LAFB and RBBB, QTC prolongation. High-sensitivity troponin elevated but stable (210 > 201). COVID and flu negative.  Chest x-ray showing no active disease. While in the ED, patient had an  episode of sinus tachycardia with rate in the 130s to 140s and became hypotensive with systolic in the 21H. 500 cc fluid bolus was given after which blood pressure improved and heart rate normalized.  ED physician discussed the case with cardiology, recommended admission for observation. TRH consulted.  Assessment and Plan: * Chest pain, Multivessel CAD- (present on admission) Patient represented to the ED with persistent chest pain.  Seen 4 days prior with noted troponins elevated at 100 but flat and consistent with previous.  Cardiology at that time felt her symptoms were related to set fluid volume shifts during dialysis; however, she has continued to experience chest pain even on nondialysis days associated with dyspnea. EKG with new LAFB and RBBB. Cardiac catheterization done in 2016 showing nonobstructive CAD.  Stress test done in October 2022 was intermediate risk.  Cardiology was consulted by ED who recommended admission for observation.  Patient did develop transient episode of tachycardia, hypotension and respiratory failure that resolved with IV fluid bolus and she was titrated off of oxygen.  Nuclear medicine VQ scan with low probability pulmonary embolism.  TTE with LVEF 40-45%, LV mildly decreased function, no regional wall motion abnormalities, moderate LVH, RVSP 39.6, LA mildly dilated, noted MV repair, severe TR, moderate TV stenosis.  Underwent left heart catheterization 01/21/2022 with severe coronary calcification and multivessel CAD with LAD 99%, first diagonal 90%, ostial circumflex to proximal circumflex 100%, proximal RCA 100% stenosis. --Cardiology following, appreciate assistance --High-sensitivity troponin elevated 210 > 201 >194 > 189 > 207   --Heparin drip (Eliquis  outpatient) --Monitor on telemetry --Cardiology to consult CTS for CABG evaluation  SVT (supraventricular tachycardia) (Jody Nguyen) Patient now with 2 episodes of SVT during hospitalization, improved with Valsalva  maneuvers. --Cardiology following, appreciate assistance --Discontinue carvedilol, starting metoprolol tartrate 25 mg p.o. twice daily --Continue to monitor on telemetry  Coronary artery disease involving native coronary artery of native heart with unstable angina pectoris (Jody Nguyen)- (present on admission) Left heart catheterization 01/12/2022 with severe coronary calcification and multivessel CAD with LAD 99%, first diagonal 90%, ostial circumflex to proximal circumflex 100%, proximal RCA 100% stenosed. --Cardiology to consult CTS for possible CABG --Continue aspirin, statin, heparin drip  QT prolongation- (present on admission) EKG with QTC 556.   --Avoid QT prolonging medications --Monitor on telemetry  History of stroke --Aspirin 81 mg p.o. daily, Crestor 20 mg p.o. nightly  End stage renal disease on dialysis Jody Nguyen) Follows with nephrology outpatient, Dr. Justin Nguyen.  Dialyzes at [facility] on a MWF schedule.   --Continue HD per nephrology  Chronic combined systolic and diastolic heart failure ([place])- (present on admission) Stable.  No signs of volume overload.  TTE with LVEF now 40-45%, LV mildly decreased function, no regional wall motion normalities, moderate LVH, RVSP 39.6, LA moderately dilated, MV repair noted, severe TR, moderate TV stenosis. --HD for volume management --Strict I's and O's and daily weights  Pulmonary hypertension ([place])- (present on admission) --macitentan 10mg  PO daily --Revatio 40mg  PO TID  Obesity (BMI 30.0-34.9)- (present on admission) Body mass index is 33.71 kg/m. Discussed with patient needs for aggressive lifestyle changes/weight loss as this complicates all facets of care.  Outpatient follow-up with PCP.    Type 2 diabetes mellitus ([place]) Diet controlled at home. --sensitive SSI for coverage --CBGs qAC/HS  Hyperlipidemia associated with type 2 diabetes mellitus ([place])- (present on admission) --Crestor 20 mg p.o. nightly  Essential  hypertension- (present on admission) She had an episode of hypotension in the ED which resolved after a 500 cc fluid bolus.  Blood pressure currently stable.  On carvedilol 12.5 mg p.o. twice daily, losartan 50 mg p.o. daily, Hydralazine 50mg  PO TID at home. --Holding home antihypertensives currently due to transient hypotension on admission --Hydralazine 25mg  PO q8h PRN SBP >180 or SBP >110 --Continue monitor BP closely        Subjective: Patient seen examined bedside, resting comfortably.  Sleeping but easily arousable.  Awaiting for heart catheterization later today.  Denies chest pain.  No other specific questions or concerns at this time.  Denies headache, no chest pain, palpitations, shortness of breath, no abdominal pain, no fever/chills/night sweats, no nausea/vomiting/diarrhea, no weakness, no fatigue, no paresthesias.  No acute events overnight per nursing staff.  Physical Exam: Vitals:   01/17/2022 1215 01/01/2022 1220 01/13/2022 1225 01/11/2022 1242  BP: (!) 120/99 (!) 113/28 (!) 115/40 (!) 89/71  Pulse: 74 77 73 75  Resp: 14 19 13    Temp:    (!) 97.4 F (36.3 C)  TempSrc:    Oral  SpO2: 100% 99% 100% 100%  Weight:      Height:       Physical Exam GEN: 66 yo female in NAD, alert and oriented x 3, obese, appears older than stated age HEENT: NCAT, PERRL, EOMI, sclera clear, MMM PULM: CTAB w/o wheezes/crackles, normal respiratory effort, on 2 L nasal cannula with SPO2 93% at rest CV: RRR w/o M/G/R GI: abd soft, NTND, NABS, no R/G/M MSK: no peripheral edema, muscle strength globally intact 5/5 bilateral upper/lower extremities NEURO: CN II-XII intact,  no focal deficits, sensation to light touch intact PSYCH: normal mood/affect Integumentary: dry/intact, no rashes or wounds   Data Reviewed:  Reviewed BMP, CBC this morning  Family Communication: No family present at bedside this morning  Disposition: Status is: Inpatient Remains inpatient appropriate because: Left heart  catheterization this morning, will need CABG evaluation          Planned Discharge Destination: Home     Time spent: 50 minutes spent on chart review, discussion with nursing staff, consultants, updating family and interview/physical exam; more than 50% of that time was spent in counseling and/or coordination of care.  Author: Donnamarie Poag [place] DO 01/13/2022 12:53 PM  For on call review [email or web address].

## 2022-01-03 NOTE — Hospital Course (Addendum)
History of Present Illness:  Jody Nguyen is a 66 year old female who recently presented on several occasions to the ER with chest pain.  On 01/22/2022 she presented to the ER with symptoms of chest pain with radiation to both arms.  She has had multiple episodes over the previous several days.  She has a history of end-stage renal disease on hemodialysis.  Pain was worse during dialysis treatment.  He had a episode of sinus tachycardia while in the emergency room.  She has a history of pulmonary hypertension and is followed by [facility]  There is associated chronic diastolic heart failure and she is known to have undergone a previous mitral valve repair in 2016 by Dr. Roxy Manns.  At that time she had nonobstructive coronary artery disease per catheterization.  She has multiple other medical risk factors including hypertension, hyperlipidemia, type 2 diabetes and a previous history of pulmonary embolism on Eliquis.  High-sensitivity troponins were elevated in the ER.  She was COVID and flu negative.  Chest x-ray showed no active disease.  He received a fluid bolus for stabilization and heart rate improved from the episode of tachycardia.  She was felt to require admission for further monitoring and stabilization.  She is noted to have had a nuclear study in October 2022 showing no ischemia.  It is noted that her mitral valve repair in 5361 was complicated by hypoxic respiratory failure requiring ECMO with transfer to [facility] and eventual tracheostomy.  At this time she developed the renal failure.  VQ scan was low risk for pulmonary embolism.  A repeat echocardiogram was performed on 01/02/2022 with the full report listed below.  Ejection fraction was estimated to be 40 to 45% with mildly reduced LV function.  The right ventricular systolic function was mildly reduced.  There was some enlargement of the right ventricle and elevated pulmonary artery pressures were mild.  There is  no evidence for mitral valve regurgitation but there was moderate mitral stenosis.  The tricuspid valve showed severe regurgitation and moderate stenosis.  The aortic valve was unremarkable.  Cardiac catheterization was performed today that shows severe 99% proximal LAD disease, 90% first diagonal lesion, ostial circumflex to proximal circumflex lesion with 100% stenosis and a proximal RCA lesion with 100% stenosis.  Coronaries are noted to be severely calcified.  LVEDP was 23.  We are asked to see the patient for consideration of CABG due to the severity of the anatomy and significant symptoms.  Hospital Course:  The patient was evaluated by Dr. Roxan Hockey who felt patient would benefit from coronary artery bypass grafting procedure and possible repair of Tricuspid Valve.  He felt the patient would be very high risk with previous cardiac surgery with complications requiring ECMO and long hospitalization previously.  The patient and her family understood the risk and were agreeable to proceed.  She was taken to the operating room and underwent CABG x 3 utilizing LIMA to LAD, SVG to Diagonal, and SVG to PDA.  She also underwent closure of Patent Foramen Ovale, Tricuspid Valve Annuloplasty, and endoscopic harvest of greater saphenous vein from her right leg.  She tolerated the procedure and was taken to the SICU in stable condition. She was on Milrinone, Epinephrine, Nor Epinephrine, and Vasopressin drips. She remained intubated but gradually less sedated. Pulmonary/CCM managed vent. She was able to respond to questions and commands. With her history of ESRD, nephrology continued to follow post op and arrange for CRRT accordingly. She was started  on tube feedings for nutritional support. Per pulmonary, try SBT today. Tube feedings were held on 02/14 secondary to emesis. Regarding her rhythm, she was sinus tachycardia 02/15. Pressors continued to be weaned as able.  She had thrombocytopenia post op. Her platelet  count went as low as 49,000. HIT was checked and was negative. Her WBC count went up to 23,000 on 02/15. She remained afebrile and no sign of wound infection. She has been on Solu cortef so was monitored closely. Patient was successfully extubated 02/16. Patient had no thrill or bruit in right forearm fistula 02/16. It had previously been infiltrated but was now thrombosed. Per vascular surgery, patient will need new access once more stable. She received CRRT via left femoral temporary HD catheter. Tube feedings were resumed as she had no further emesis. Nephrology followed patient post op and arranged for CRRT accordingly. She was put on Bivalirudin as she has a history of PE. Swallow study was ordered 02/18. Speech pathology recommend continuing cortrak for primary nutrition, allowing ice chips, sips of honey-thick liquids and bites of applesauce from floor stock for pleasure. Speech pathology continued to follow patient and recommendations were followed accordingly. Pulmonary/CCM placed new lines on 02/18 secondary to elevated WBC and fever. She was put on Vancomycin and Zosyn. Blood culture showed Morganella Morganii. She was then put on Ceftriaxone. Co ox decreased so Milrinone was restarted on 02/19. Echo was done on 02/19 that showed severe RV dysfunction. Platelet count continued to increase and was up to 154,000. Patient was very deconditioned so PT ordered. She went into a fib with RVR last evening 02/21. She was put on IV Amiodarone the am of 02/22. Her H and H on 02/25 was decreased to 7.1 and 23.5. She was transfused. Patient was more interactive and alert over this past weekend. She was put on Macitentan for [facility] but this was later stopped. She had post cardiotomy cardiogenic shock which per advanced heart failure, was mostly due to end stage RV failure. She had Swan placed and [place]ett 03/01. Per Dr. Haroldine Laws, Milrinone decreased to 0.125. Milrinone was then stopped on 03/01 as with low SVR. Co ox on 03/02  was 72.7. Nor epi and Epi were then stopped. Her co ox was decreased to 43.6 on 03/03.       ROS

## 2022-01-03 NOTE — Progress Notes (Signed)
Hemodialysis- Patient bent arm and infiltrated avf. Unable to cannulate higher due to anatomy and large infiltration. Needles pulled and ice applied. Notified Dr. Jonnie Finner. Unable to return blood due to issue. Report called to primary RN, unable to reattempt today.

## 2022-01-03 NOTE — Progress Notes (Signed)
ANTICOAGULATION CONSULT NOTE - Follow Up Consult  Pharmacy Consult for heparin  Indication: r/o acute PE   Allergies  Allergen Reactions   Minoxidil Other (See Comments)    Pericardial effusion   Lipitor [Atorvastatin] Other (See Comments)    MYALGIAS > "pain in legs" Tolerates rosuvastatin   Morphine And Related Itching   Ace Inhibitors Other (See Comments)    REACTION: "not sure...think it made me drowsy all the time"    Patient Measurements: Height: 4' 11.5" (151.1 cm) Weight: 77 kg (169 lb 12.1 oz) IBW/kg (Calculated) : 44.35 Heparin Dosing Weight: 61.9 kg  Vital Signs: Temp: 98 F (36.7 C) (02/07 0700) Temp Source: Oral (02/07 0700) BP: 88/53 (02/07 0700) Pulse Rate: 72 (02/07 0700)  Labs: Recent Labs    01/07/2022 1853 01/01/2022 2052 01/01/22 0625 01/01/22 1211 01/01/22 1402 01/01/22 1402 01/01/22 2230 01/02/22 0704 01/12/2022 0415  HGB 10.7*  --   --   --   --   --   --  10.8* 10.4*  HCT 34.9*  --   --   --   --   --   --  32.7* 31.7*  PLT 130*  --   --   --   --   --   --  118* 90*  APTT  --   --   --   --  88*  --   --   --   --   HEPARINUNFRC  --   --   --   --  0.28*   < > 0.44 0.42 0.54  CREATININE 7.25*  --  7.94*  --   --   --   --  9.15* 10.38*  TROPONINIHS 210*   < > 194* 189* 207*  --   --   --   --    < > = values in this interval not displayed.     Estimated Creatinine Clearance: 4.8 mL/min (A) (by C-G formula based on SCr of 10.38 mg/dL (H)).  Assessment: 66yo female c/o CP with intermittent SOB, admitted for acute hypoxic respiratory failure, to transition from Eliquis for concern for acute PE given hypoxia and tachycardia, awaiting bedside echo and VQ scan; pt missed Saturday doses of Eliquis, last taken Friday pm per patient.  Heparin level is therapeutic, H/H stable, pltc down need to watch. VQ scan negative for PE, cath planned today.  Goal of Therapy:  Heparin level 0.3-0.7 units/ml Monitor platelets by anticoagulation protocol: Yes    Plan:  Continue heparin 1200 units/h Daily heparin level and CBC  Arrie Senate, PharmD, [place], [facility] Clinical Pharmacist [phone] Please check AMION for all [facility] Pharmacy numbers 01/01/2022

## 2022-01-03 NOTE — H&P (View-Only) (Signed)
[address]       [place] [address]             [phone]        Jody Nguyen [facility] Medical Record #[id number] Date of Birth: June 03, 1956  Referring: No ref. provider found Primary Care: Unk Pinto, MD Primary Cardiologist:Brian Stanford Breed, MD  Chief Complaint:    Chief Complaint  Patient presents with   Chest Pain   Arm Pain    History of Present Illness:       History of Present Illness:    We are asked to consult on this 66 year old female who recently presented on several occasions to the ER with chest pain.  On 01/20/2022 she presented to the ER with symptoms of chest pain with radiation to both arms.  She has had multiple episodes over the previous several days.  She has a history of end-stage renal disease on hemodialysis.  Pain was worse during dialysis treatment.  He had a episode of sinus tachycardia while in the emergency room.  She has a history of pulmonary hypertension and is followed by [facility].  There is associated chronic diastolic heart failure and she is known to have undergone a previous mitral valve repair in 2016 by Dr. Roxy Manns.  At that time she had nonobstructive coronary artery disease per catheterization.  She has multiple other medical risk factors including hypertension, hyperlipidemia, type 2 diabetes and a previous history of pulmonary embolism on Eliquis.  High-sensitivity troponins were elevated in the ER.  She was COVID and flu negative.  Chest x-ray showed no active disease.  He received a fluid bolus for stabilization and heart rate improved from the episode of tachycardia.  She was felt to require admission for further monitoring and stabilization.  She is noted to have had a nuclear study in October 2022 showing no ischemia.  It is noted that her mitral valve repair in 8338 was complicated by hypoxic respiratory failure requiring ECMO with transfer to [facility] and eventual  tracheostomy.  At this time she developed the renal failure.  VQ scan was low risk for pulmonary embolism.  A repeat echocardiogram was performed on 01/02/2022 with the full report listed below.  Ejection fraction was estimated to be 40 to 45% with mildly reduced LV function.  The right ventricular systolic function was mildly reduced.  There was some enlargement of the right ventricle and elevated pulmonary artery pressures were mild.  There is no evidence for mitral valve regurgitation but there was moderate mitral stenosis.  The tricuspid valve showed severe regurgitation and moderate stenosis.  The aortic valve was unremarkable.  Cardiac catheterization was performed today that shows severe 99% proximal LAD disease, 90% first diagonal lesion, ostial circumflex to proximal circumflex lesion with 100% stenosis and a proximal RCA lesion with 100% stenosis.  Coronaries are noted to be severely calcified.  LVEDP was 23.  We are asked to see the patient for consideration of CABG due to the severity of the anatomy and significant symptoms.    Current Activity/ Functional Status: Patient is independent with mobility/ambulation, transfers, ADL's, IADL's.   Zubrod Score: At the time of surgery this patients most appropriate activity status/level should be described as: []     0    Normal activity, no symptoms []     1    Restricted in physical strenuous activity but ambulatory, able to do out light work [x]   2    Ambulatory and capable of self care, unable to do work activities, up and about                 more than 50%  Of the time                            []     3    Only limited self care, in bed greater than 50% of waking hours []     4    Completely disabled, no self care, confined to bed or chair []     5    Moribund  Past Medical History:  Diagnosis Date   Anemia    Chronic diastolic CHF (congestive heart failure) (HCC)    Chronic diastolic heart failure (HCC)    CKD (chronic kidney disease)  stage 4, GFR 15-29 ml/min (HCC)    dialysis M/W/F   Constipation    CVA (cerebral infarction) 1997   no residual deficit   Diverticulitis    DM (diabetes mellitus) ([place])    type 2   Heart murmur    History of cardiovascular stress test    a. Myoview Oct 2012 showed EF 49%, no ischemia, LVE   HLD (hyperlipidemia)    takes Crestor daily   Hypertension    Pericardial effusion    chronic; felt to be poss related to minoxidil >> DC'd   Pulmonary hypertension ([place]) 06/18/2015   Respiratory failure, acute hypoxic, post-operative 07/08/2015   Requiring ECMO support   S/P cardiac catheterization    a. R/L [facility] 06/18/15:  mLAD 30%; severe pulmo HTN with PA sat 43%, CI 1.86, prominent V waves indicative of MR; resting hypoxemia O2 sat 86% on RA   S/P minimally invasive mitral valve repair 07/08/2015   Complex valvuloplasty including artificial Gore-tex neochord placement x10 and 26 mm Sorin Memo 3D Rechord ring annuloplasty via right mini thoracotomy approach   Severe mitral regurgitation    Shortness of breath dyspnea    Stroke ([place]) 1997   no residual effect   Vitamin D deficiency    Wears glasses     Past Surgical History:  Procedure Laterality Date   A/V FISTULAGRAM Right 08/16/2017   Procedure: A/V Fistulagram;  Surgeon: Conrad Shenandoah, MD;  Location: [facility];  Service: Cardiovascular;  Laterality: Right;   AV FISTULA PLACEMENT Left 10/22/2015   Procedure: RADIOCEPHALIC ARTERIOVENOUS (AV) FISTULA CREATION;  Surgeon: Serafina Mitchell, MD;  Location: [facility];  Service: Vascular;  Laterality: Left;   AV FISTULA PLACEMENT Right 05/22/2017   Procedure: ARTERIOVENOUS (AV) FISTULA CREATION-RIGHT ARM;  Surgeon: Waynetta Sandy, MD;  Location: [facility];  Service: Vascular;  Laterality: Right;   CANNULATION FOR CARDIOPULMONARY BYPASS N/A 07/08/2015   Procedure: CANNULATION FOR ECMO;  Surgeon: Rexene Alberts, MD;  Location: [facility];  Service: Open Heart Surgery;  Laterality: N/A;    CARDIAC CATHETERIZATION     CARDIAC CATHETERIZATION N/A 06/18/2015   Procedure: Right/Left Heart Cath and Coronary Angiography;  Surgeon: Jettie Booze, MD;  Location: [facility];  Service: Cardiovascular;  Laterality: N/A;   CESAREAN SECTION     x 2   COLONOSCOPY     EXCHANGE OF A DIALYSIS CATHETER N/A 06/28/2017   Procedure: EXCHANGE OF A DIALYSIS CATHETER;  Surgeon: Waynetta Sandy, MD;  Location: [facility];  Service: Vascular;  Laterality: N/A;   FISTULA SUPERFICIALIZATION Left 12/28/2015  Procedure: SUPERFICIALIZATION LEFT RADIOCEPHALIC FISTULA;  Surgeon: Serafina Mitchell, MD;  Location: [facility];  Service: Vascular;  Laterality: Left;   FISTULA SUPERFICIALIZATION Right 08/21/2017   Procedure: FISTULA SUPERFICIALIZATION RIGHT ARM;  Surgeon: Waynetta Sandy, MD;  Location: [facility];  Service: Vascular;  Laterality: Right;   INSERTION OF DIALYSIS CATHETER Right 04/28/2017   Procedure: INSERTION OF DIALYSIS CATHETER-RIGHT INTERNAL JUGULAR PLACEMENT;  Surgeon: Elam Dutch, MD;  Location: [facility];  Service: Vascular;  Laterality: Right;   LEFT HEART CATH AND CORONARY ANGIOGRAPHY N/A 01/22/2022   Procedure: LEFT HEART CATH AND CORONARY ANGIOGRAPHY;  Surgeon: Troy Sine, MD;  Location: [facility];  Service: Cardiovascular;  Laterality: N/A;   LIGATION OF ARTERIOVENOUS  FISTULA Left 04/28/2017   Procedure: LIGATION OF ARTERIOVENOUS  FISTULA;  Surgeon: Elam Dutch, MD;  Location: [facility];  Service: Vascular;  Laterality: Left;   MITRAL VALVE REPAIR Right 07/08/2015   Procedure: MINIMALLY INVASIVE MITRAL VALVE REPAIR with a 26 Sorin Memo 3D Rechord;  Surgeon: Rexene Alberts, MD;  Location: [facility];  Service: Open Heart Surgery;  Laterality: Right;   MULTIPLE EXTRACTIONS WITH ALVEOLOPLASTY N/A 06/30/2015   Procedure: MULTIPLE EXTRACTIONS OF TOOTH #'S 4  AND 30 WITH ALVEOLOPLASTY AND GROSS DEBRIDEMENT  OF REMAINING TEETH;  Surgeon: Lenn Cal, DDS;  Location: [facility];   Service: Oral Surgery;  Laterality: N/A;   PERIPHERAL VASCULAR CATHETERIZATION Left 03/28/2016   Procedure: A/V Fistulagram;  Surgeon: Serafina Mitchell, MD;  Location: [facility];  Service: Cardiovascular;  Laterality: Left;  lower arm   TEE WITHOUT CARDIOVERSION N/A 06/08/2015   Procedure: TRANSESOPHAGEAL ECHOCARDIOGRAM (TEE);  Surgeon: Lelon Perla, MD;  Location: [facility];  Service: Cardiovascular;  Laterality: N/A;   TEE WITHOUT CARDIOVERSION N/A 07/08/2015   Procedure: TRANSESOPHAGEAL ECHOCARDIOGRAM (TEE);  Surgeon: Rexene Alberts, MD;  Location: [facility];  Service: Open Heart Surgery;  Laterality: N/A;    Social History   Tobacco Use  Smoking Status Never  Smokeless Tobacco Never    Social History   Substance and Sexual Activity  Alcohol Use No   Alcohol/week: 0.0 standard drinks     Allergies  Allergen Reactions   Minoxidil Other (See Comments)    Pericardial effusion   Lipitor [Atorvastatin] Other (See Comments)    MYALGIAS > "pain in legs" Tolerates rosuvastatin   Morphine And Related Itching   Ace Inhibitors Other (See Comments)    REACTION: "not sure...think it made me drowsy all the time"    Current Facility-Administered Medications  Medication Dose Route Frequency Provider Last Rate Last Admin   0.9 %  sodium chloride infusion  100 mL Intravenous PRN Loren Racer, PA-C       0.9 %  sodium chloride infusion  100 mL Intravenous PRN Loren Racer, PA-C       0.9 %  sodium chloride infusion  250 mL Intravenous PRN Troy Sine, MD       acetaminophen (TYLENOL) tablet 650 mg  650 mg Oral Q6H PRN Shela Leff, MD   650 mg at 01/02/22 1350   Or   acetaminophen (TYLENOL) suppository 650 mg  650 mg Rectal Q6H PRN Shela Leff, MD       acetaminophen (TYLENOL) tablet 650 mg  650 mg Oral Q4H PRN Troy Sine, MD       aspirin chewable tablet 81 mg  81 mg Oral Daily Troy Sine, MD  cinacalcet (SENSIPAR) tablet 90 mg  90 mg  Oral Once per day on Mon Wed Fri Loren Racer, PA-C   90 mg at 01/02/22 6629   diazepam (VALIUM) tablet 5 mg  5 mg Oral Q4H PRN Troy Sine, MD       doxercalciferol (HECTOROL) injection 9 mcg  9 mcg Intravenous Q M,W,F-HD Loren Racer, PA-C       heparin ADULT infusion 100 units/mL (25000 units/228mL)  1,200 Units/hr Intravenous Continuous Einar Grad, RPH       hydrALAZINE (APRESOLINE) injection 10 mg  10 mg Intravenous Q20 Min PRN Troy Sine, MD       hydrALAZINE (APRESOLINE) tablet 25 mg  25 mg Oral Q8H PRN [place] Eric J, DO       insulin aspart (novoLOG) injection 0-5 Units  0-5 Units Subcutaneous QHS Shela Leff, MD       insulin aspart (novoLOG) injection 0-6 Units  0-6 Units Subcutaneous TID WC Shela Leff, MD   1 Units at 01/01/22 1157   labetalol (NORMODYNE) injection 10 mg  10 mg Intravenous Q10 min PRN Troy Sine, MD       latanoprost (XALATAN) 0.005 % ophthalmic solution 1 drop  1 drop Both Eyes QHS [place] Donnamarie Poag, DO   1 drop at 01/02/22 2315   lidocaine (PF) (XYLOCAINE) 1 % injection 5 mL  5 mL Intradermal PRN Loren Racer, PA-C       lidocaine-prilocaine (EMLA) cream 1 application  1 application Topical PRN Loren Racer, PA-C       macitentan (OPSUMIT) tablet 10 mg  10 mg Oral Daily [place] Donnamarie Poag, DO   10 mg at 12/29/2021 4765   metoprolol tartrate (LOPRESSOR) tablet 12.5 mg  12.5 mg Oral BID Lelon Perla, MD       pantoprazole (PROTONIX) EC tablet 40 mg  40 mg Oral Daily PRN [place] Eric J, DO       pentafluoroprop-tetrafluoroeth (GEBAUERS) aerosol 1 application  1 application Topical PRN Loren Racer, PA-C       rosuvastatin (CRESTOR) tablet 20 mg  20 mg Oral QHS [place] Donnamarie Poag, DO   20 mg at 01/02/22 2316   sildenafil (REVATIO) tablet 40 mg  40 mg Oral TID [place] Eric J, DO   40 mg at 12/28/2021 4650   sodium chloride flush (NS) 0.9 % injection 3 mL  3 mL Intravenous Q12H Isaiah Serge, NP   3 mL at  01/05/2022 0959   sodium chloride flush (NS) 0.9 % injection 3 mL  3 mL Intravenous Q12H Troy Sine, MD       sodium chloride flush (NS) 0.9 % injection 3 mL  3 mL Intravenous PRN Troy Sine, MD        Medications Prior to Admission  Medication Sig Dispense Refill Last Dose   acetaminophen (TYLENOL) 500 MG tablet Take 500 mg by mouth as needed (for pain.).   Past Month   apixaban (ELIQUIS) 5 MG TABS tablet Take 5 mg by mouth 2 (two) times daily.   12/30/2021 at 2000   aspirin EC 81 MG tablet Take 81 mg by mouth daily.   12/30/2021   carvedilol (COREG) 25 MG tablet Take 12.5 mg by mouth 2 (two) times daily.   12/30/2021 at 2000   cinacalcet (SENSIPAR) 60 MG tablet Take 1 tablet (60 mg total) by mouth daily. (Patient taking differently: Take 60 mg by mouth See admin instructions. Takes  at dialysis M/W/F) 90 tablet 1 12/30/2021   hydrALAZINE (APRESOLINE) 50 MG tablet Take 1 tablet (50 mg total) by mouth 3 (three) times daily. FOR BLOOD  PRESSURE AND HEART 270 tablet 3 12/30/2021   latanoprost (XALATAN) 0.005 % ophthalmic solution Place 1 drop into both eyes at bedtime.   12/30/2021   losartan (COZAAR) 50 MG tablet Take 1 tablet Daily for BP (Patient taking differently: Take 50 mg by mouth daily.) 90 tablet 1 12/30/2021   macitentan (OPSUMIT) 10 MG tablet Take 10 mg by mouth daily.   12/30/2021   nitroGLYCERIN (NITROSTAT) 0.4 MG SL tablet Place 0.4 mg under the tongue every 5 (five) minutes x 3 doses as needed for chest pain.   unknown   ondansetron (ZOFRAN ODT) 4 MG disintegrating tablet Take 1 tablet (4 mg total) by mouth every 8 (eight) hours as needed for nausea or vomiting. 10 tablet 0 unknown   pantoprazole (PROTONIX) 40 MG tablet Take 1 tablet (40 mg total) by mouth daily. (Patient taking differently: Take 40 mg by mouth daily as needed (GERD).) 30 tablet 1 unknown   RENVELA 800 MG tablet Take 1,600-2,400 mg by mouth 3 (three) times daily with meals.  5 12/30/2021   rosuvastatin (CRESTOR) 20 MG tablet  Take 1 tablet Daily for Cholesterol (Patient taking differently: Take 20 mg by mouth at bedtime.) 90 tablet 3 12/30/2021   sertraline (ZOLOFT) 25 MG tablet Take 1 tablet Daily for Mood (Patient taking differently: Take 25 mg by mouth daily.) 90 tablet 3 12/30/2021   sevelamer carbonate (RENVELA) 2.4 g PACK Take 2.4 g by mouth 3 (three) times daily with meals.   unknown   sildenafil (REVATIO) 20 MG tablet Take 2 tablets (40 mg) 3 x  /day fr Pulmonary Hypertension (Patient taking differently: Take 40 mg by mouth 3 (three) times daily.) 540 tablet 3 12/30/2021   azelastine (ASTELIN) 0.1 % nasal spray Use 1 to 2 sprays each nostril 2 to 3 x / day (Patient not taking: Reported on 01/01/2022) 90 mL 3 Not Taking    Family History  Problem Relation Age of Onset   Stroke Mother    Breast cancer Sister    Cancer Sister        breast cancer   Heart attack Brother        MI in his 85s   Heart disease Brother        before age 12   Cancer Brother      Review of Systems:   Review of Systems  Constitutional:  Positive for malaise/fatigue.  HENT: Negative.    Eyes: Negative.   Respiratory:  Positive for shortness of breath. Negative for cough, hemoptysis, sputum production and wheezing.   Cardiovascular:  Positive for chest pain. Negative for palpitations, orthopnea, claudication, leg swelling and PND.  Gastrointestinal:  Positive for nausea and vomiting. Negative for abdominal pain, blood in stool and melena.  Genitourinary:        ESRD, makes some urine  Musculoskeletal:  Positive for myalgias.  Skin: Negative.   Neurological:  Positive for headaches.  Psychiatric/Behavioral: Negative.         Physical Exam: BP 111/62    Pulse (!) 56    Temp 97.7 F (36.5 C) (Oral)    Resp 19    Ht 4' 11.5" (1.511 m)    Wt 81.2 kg    SpO2 100%    BMI 35.55 kg/m    Physical Exam  Constitutional: She appears chronically ill.  HENT:  Mouth/Throat: Oropharynx is clear. Pharynx is normal.  Eyes: Pupils are  equal, round, and reactive to light. Conjunctivae are normal.  Neck: No JVD present. No neck adenopathy. No thyromegaly present.  Cardiovascular: Normal rate, regular rhythm, S1 normal and S2 normal. Exam reveals decreased pulses.  Murmur heard. Holosystolic murmur is present with a grade of 2/6. Pulmonary/Chest: Effort normal and breath sounds normal. She has no wheezes. She has no rales. She exhibits no tenderness.  Abdominal: Soft. Bowel sounds are normal. She exhibits no distension and no mass. There is no abdominal tenderness. A hernia is present.  Umbilical hernia Tender to touch but reduces easily  Musculoskeletal:        General: No tenderness, deformity or edema.     Cervical back: Normal range of motion.  Neurological: She is alert and oriented to person, place, and time.  Skin: Skin is warm and dry. No rash noted. No cyanosis. No jaundice. Nails show no clubbing.    Diagnostic Studies & Laboratory data:     Recent Radiology Findings:   CARDIAC CATHETERIZATION  Result Date: 01/21/2022   Prox LAD lesion is 99% stenosed.   1st Diag lesion is 90% stenosed.   Ost Cx to Prox Cx lesion is 100% stenosed.   Prox RCA lesion is 100% stenosed. Severe coronary calcification and multivessel CAD. The LAD is 99% noticed after giving rise to a optional diagonal like vessel that has 90% ostial stenosis.  The mid LAD has 30 and 70% mid distal stenoses. The left circumflex vessel was totally occluded at the ostium.  There is septal to the distal circumflex collateralization with filling of the vessel up to the proximal AV groove. The right coronary artery is totally occluded proximally.  There is bridging collaterals filling the RCA from the RV marginal branch downward. LVEDP 23 mm. RECOMMENDATION: We will obtain surgical consultation for consideration of CABG revascularization.  The patient has good targets.  Reportedly, the patient had no ischemia on a prior nuclear study and a lot of anatomical findings,  question if there was balanced ischemia at the time of her prior study.  Will resume heparin 8 hours post procedure.  NM Pulmonary Perf and Vent  Result Date: 01/02/2022 CLINICAL DATA:  Pulmonary embolism (PE) suspected, high prob EXAM: NUCLEAR MEDICINE PERFUSION LUNG SCAN TECHNIQUE: Perfusion images were obtained in multiple projections after intravenous injection of radiopharmaceutical. Ventilation scans intentionally deferred if perfusion scan and chest x-ray adequate for interpretation during COVID 19 epidemic. RADIOPHARMACEUTICALS:  4.1 mCi Tc-71m MAA IV COMPARISON:  Chest x-ray 01/02/2022 FINDINGS: Distribution of radiopharmaceutical within both lung fields. Large area of central photopenia related to cardiomegaly. No segmental perfusion defect. IMPRESSION: Low probability for pulmonary embolism. Electronically Signed   By: Davina Poke D.O.   On: 01/02/2022 16:25   DG Chest Port 1 View  Result Date: 01/02/2022 CLINICAL DATA:  Preop EXAM: PORTABLE CHEST 1 VIEW COMPARISON:  Chest x-ray dated December 31, 2021 FINDINGS: Unchanged enlarged cardiac and mediastinal contours. Lungs are clear. No large effusion or evidence of pneumothorax. IMPRESSION: Stable cardiomegaly with no evidence of active disease. Electronically Signed   By: Yetta Glassman M.D.   On: 01/02/2022 08:17   ECHOCARDIOGRAM COMPLETE  Result Date: 01/02/2022    ECHOCARDIOGRAM REPORT   Patient Name:   Jody Nguyen Date of Exam: 01/02/2022 Medical Rec #:  [id number]        Height:       59.5 in Accession #:  [id number]       Weight:       169.8 lb Date of Birth:  May 08, 1956         BSA:          1.730 m Patient Age:    48 years         BP:           103/74 mmHg Patient Gender: F                HR:           91 bpm. Exam Location:  Inpatient Procedure: 2D Echo, Cardiac Doppler and Color Doppler Indications:    Chest pain  History:        Patient has prior history of Echocardiogram examinations. CHF,                 Pulmonary HTN; Risk  Factors:Hypertension and Diabetes.                  Mitral Valve: 26 mm prosthetic annuloplasty ring valve is                 present in the mitral position.  Sonographer:    Jyl Heinz Referring Phys: [id number] [place]  1. Left ventricular ejection fraction, by estimation, is 40 to 45%. The left ventricle has mildly decreased function. The left ventricle has no regional wall motion abnormalities. There is moderate left ventricular hypertrophy. Left ventricular diastolic parameters are indeterminate.  2. Right ventricular systolic function is mildly reduced. The right ventricular size is mildly enlarged. There is mildly elevated pulmonary artery systolic pressure. The estimated right ventricular systolic pressure is 67.3 mmHg.  3. Left atrial size was mildly dilated.  4. The mitral valve has been repaired/replaced. No evidence of mitral valve regurgitation. Moderate mitral stenosis. The mean mitral valve gradient is 8.5 mmHg. There is a 26 mm prosthetic annuloplasty ring present in the mitral position.  5. Tricuspid valve regurgitation is severe. Moderate tricuspid stenosis.  6. The aortic valve is normal in structure. Aortic valve regurgitation is not visualized. No aortic stenosis is present.  7. The inferior vena cava is dilated in size with <50% respiratory variability, suggesting right atrial pressure of 15 mmHg. Comparison(s): No significant change from prior study. Prior images reviewed side by side. FINDINGS  Left Ventricle: Left ventricular ejection fraction, by estimation, is 40 to 45%. The left ventricle has mildly decreased function. The left ventricle has no regional wall motion abnormalities. The left ventricular internal cavity size was normal in size. There is moderate left ventricular hypertrophy. Left ventricular diastolic parameters are indeterminate. Right Ventricle: The right ventricular size is mildly enlarged. No increase in right ventricular wall thickness. Right  ventricular systolic function is mildly reduced. There is mildly elevated pulmonary artery systolic pressure. The tricuspid regurgitant  velocity is 2.48 m/s, and with an assumed right atrial pressure of 15 mmHg, the estimated right ventricular systolic pressure is 41.9 mmHg. Left Atrium: Left atrial size was mildly dilated. Right Atrium: Right atrial size was normal in size. Pericardium: There is no evidence of pericardial effusion. Mitral Valve: The mitral valve has been repaired/replaced. There is severe thickening of the mitral valve leaflet(s). There is severe calcification of the mitral valve leaflet(s). Mildly decreased mobility of the mitral valve leaflets. No evidence of mitral valve regurgitation. There is a 26 mm prosthetic annuloplasty ring present in the mitral position. Moderate mitral valve stenosis. MV peak gradient, 14.7 mmHg. The  mean mitral valve gradient is 8.5 mmHg. Tricuspid Valve: The tricuspid valve is normal in structure. Tricuspid valve regurgitation is severe. Moderate tricuspid stenosis. Aortic Valve: The aortic valve is normal in structure. Aortic valve regurgitation is not visualized. Aortic regurgitation PHT measures 359 msec. No aortic stenosis is present. Aortic valve peak gradient measures 5.8 mmHg. Pulmonic Valve: The pulmonic valve was normal in structure. Pulmonic valve regurgitation is trivial. No evidence of pulmonic stenosis. Aorta: The aortic root is normal in size and structure. Venous: The inferior vena cava is dilated in size with less than 50% respiratory variability, suggesting right atrial pressure of 15 mmHg. IAS/Shunts: There is redundancy of the interatrial septum. No atrial level shunt detected by color flow Doppler.  LEFT VENTRICLE PLAX 2D LVIDd:         5.10 cm      Diastology LVIDs:         4.40 cm      LV e' medial:    6.42 cm/s LV PW:         1.70 cm      LV E/e' medial:  24.1 LV IVS:        1.20 cm      LV e' lateral:   7.40 cm/s LVOT diam:     2.00 cm      LV  E/e' lateral: 20.9 LV SV:         47 LV SV Index:   27 LVOT Area:     3.14 cm  LV Volumes (MOD) LV vol d, MOD A2C: 147.0 ml LV vol d, MOD A4C: 95.3 ml LV vol s, MOD A2C: 75.7 ml LV vol s, MOD A4C: 48.8 ml LV SV MOD A2C:     71.3 ml LV SV MOD A4C:     95.3 ml LV SV MOD BP:      59.2 ml RIGHT VENTRICLE            IVC RV Basal diam:  4.50 cm    IVC diam: 2.70 cm RV Mid diam:    4.00 cm RV S prime:     5.98 cm/s TAPSE (M-mode): 0.9 cm LEFT ATRIUM           Index        RIGHT ATRIUM           Index LA diam:      4.80 cm 2.77 cm/m   RA Area:     28.90 cm LA Vol (A2C): 61.3 ml 35.43 ml/m  RA Volume:   109.00 ml 63.00 ml/m LA Vol (A4C): 64.3 ml 37.16 ml/m  AORTIC VALVE AV Area (Vmax): 2.22 cm AV Vmax:        120.00 cm/s AV Peak Grad:   5.8 mmHg LVOT Vmax:      84.70 cm/s LVOT Vmean:     58.200 cm/s LVOT VTI:       0.149 m AI PHT:         359 msec  AORTA Ao Root diam: 2.60 cm Ao Asc diam:  3.30 cm MITRAL VALVE                TRICUSPID VALVE MV Area (PHT): 3.01 cm     TR Peak grad:   24.6 mmHg MV Area VTI:   1.08 cm     TR Vmax:        248.00 cm/s MV Peak grad:  14.7 mmHg MV Mean grad:  8.5 mmHg     SHUNTS MV  Vmax:       1.92 m/s     Systemic VTI:  0.15 m MV Vmean:      140.0 cm/s   Systemic Diam: 2.00 cm MV Decel Time: 252 msec MV E velocity: 155.00 cm/s MV A velocity: 179.00 cm/s MV E/A ratio:  0.87 Candee Furbish MD Electronically signed by Candee Furbish MD Signature Date/Time: 01/02/2022/2:17:58 PM    Final      I have independently reviewed the above radiologic studies and discussed with the patient   Recent Lab Findings: Lab Results  Component Value Date   WBC 3.9 (L) 01/20/2022   HGB 10.4 (L) 01/20/2022   HCT 31.7 (L) 01/18/2022   PLT 90 (L) 01/05/2022   GLUCOSE 128 (H) 01/10/2022   CHOL 227 (H) 12/08/2021   TRIG 103 12/08/2021   HDL 50 12/08/2021   LDLCALC 155 (H) 12/08/2021   ALT 24 01/01/2022   AST 23 01/01/2022   NA 135 01/02/2022   K 3.9 01/24/2022   CL 94 (L) 01/18/2022   CREATININE 10.38  (H) 01/21/2022   BUN 49 (H) 01/05/2022   CO2 24 01/04/2022   TSH 2.67 12/08/2021   INR 1.1 08/28/2021   HGBA1C 6.1 (H) 12/08/2021      Assessment / Plan: Severe multivessel coronary artery disease in the setting of previous mitral valve repair, pulmonary hypertension and chronic diastolic congestive heart failure  SVT History of pulmonary embolus End-stage renal disease on hemodialysis Chronic anemia Previous CVA with no residual deficit Diverticulitis Type 2 diabetes mellitus Hyperlipidemia Hypertension History of chronic pericardial effusion   Plan: She would benefit from coronary artery surgical revascularization but this would be considered high risk due to multiple comorbidities as well as previous events of past surgical intervention related to her mitral valve repair.    I  spent 30 minutes counseling the patient face to face.   [name], PA-C  01/08/2022 2:56 PM  I have seen and examined Mrs. Feimster. I reviewed the chart, her old operative report, cath films and echo report.  66 yo woman with multiple medical problems including ESRD on hemodialysis, diabetes, mitral valve repair with moderate stenosis, pulmonary hypertension, SVT, hypertension, hyperlipidemia, and CVA. Presents with CP and found to have severe 3 vessel CAD with CTO of RCA and circumflex and tight stenoses in LAD and diagonal. Unfortunately no right heart cath done. Echo showed EF 50-27%, mild RV systolic dysfunction, moderate MS and severe TR.  CABG is indicated for survival benefit albeit at very high risk.   Will discuss TR with Cardiology  I discussed the general nature of the procedure, the need for general anesthesia, the incisions to be used, and the use of cardiopulmonary bypass with Mrs Abdo and her husband. We discussed the expected hospital stay, overall recovery and short and long term outcomes. I infomed them of the indications, risks, benefits and alternatives.  They understand the  risks include, but are not limited to death, stroke, MI, DVT/PE, bleeding, possible need for transfusion, infections,other organ system dysfunction including respiratory or GI complications.   They understand she is a very high risk patient.  Friday is likely the earliest we can get OR time  Remo Lipps C. Roxan Hockey, MD [facility] [phone]

## 2022-01-04 DIAGNOSIS — E871 Hypo-osmolality and hyponatremia: Secondary | ICD-10-CM

## 2022-01-04 DIAGNOSIS — N185 Chronic kidney disease, stage 5: Secondary | ICD-10-CM | POA: Diagnosis not present

## 2022-01-04 DIAGNOSIS — Z9889 Other specified postprocedural states: Secondary | ICD-10-CM

## 2022-01-04 DIAGNOSIS — I2510 Atherosclerotic heart disease of native coronary artery without angina pectoris: Secondary | ICD-10-CM | POA: Diagnosis not present

## 2022-01-04 DIAGNOSIS — I200 Unstable angina: Secondary | ICD-10-CM

## 2022-01-04 DIAGNOSIS — D631 Anemia in chronic kidney disease: Secondary | ICD-10-CM

## 2022-01-04 DIAGNOSIS — R072 Precordial pain: Secondary | ICD-10-CM | POA: Diagnosis not present

## 2022-01-04 DIAGNOSIS — N189 Chronic kidney disease, unspecified: Secondary | ICD-10-CM | POA: Diagnosis not present

## 2022-01-04 DIAGNOSIS — I25110 Atherosclerotic heart disease of native coronary artery with unstable angina pectoris: Secondary | ICD-10-CM

## 2022-01-04 DIAGNOSIS — I5042 Chronic combined systolic (congestive) and diastolic (congestive) heart failure: Secondary | ICD-10-CM | POA: Diagnosis not present

## 2022-01-04 DIAGNOSIS — K219 Gastro-esophageal reflux disease without esophagitis: Secondary | ICD-10-CM | POA: Diagnosis present

## 2022-01-04 DIAGNOSIS — N186 End stage renal disease: Secondary | ICD-10-CM | POA: Diagnosis not present

## 2022-01-04 LAB — CBC
HCT: 29.9 % — ABNORMAL LOW (ref 36.0–46.0)
Hemoglobin: 9.7 g/dL — ABNORMAL LOW (ref 12.0–15.0)
MCH: 27.4 pg (ref 26.0–34.0)
MCHC: 32.4 g/dL (ref 30.0–36.0)
MCV: 84.5 fL (ref 80.0–100.0)
Platelets: 97 10*3/uL — ABNORMAL LOW (ref 150–400)
RBC: 3.54 MIL/uL — ABNORMAL LOW (ref 3.87–5.11)
RDW: 20.9 % — ABNORMAL HIGH (ref 11.5–15.5)
WBC: 3.8 10*3/uL — ABNORMAL LOW (ref 4.0–10.5)
nRBC: 0 % (ref 0.0–0.2)

## 2022-01-04 LAB — RENAL FUNCTION PANEL
Albumin: 3.3 g/dL — ABNORMAL LOW (ref 3.5–5.0)
Anion gap: 18 — ABNORMAL HIGH (ref 5–15)
BUN: 48 mg/dL — ABNORMAL HIGH (ref 8–23)
CO2: 23 mmol/L (ref 22–32)
Calcium: 8.2 mg/dL — ABNORMAL LOW (ref 8.9–10.3)
Chloride: 92 mmol/L — ABNORMAL LOW (ref 98–111)
Creatinine, Ser: 10.25 mg/dL — ABNORMAL HIGH (ref 0.44–1.00)
GFR, Estimated: 4 mL/min — ABNORMAL LOW (ref >60)
Glucose, Bld: 94 mg/dL (ref 70–99)
Phosphorus: 7 mg/dL — ABNORMAL HIGH (ref 2.5–4.6)
Potassium: 3.8 mmol/L (ref 3.5–5.1)
Sodium: 133 mmol/L — ABNORMAL LOW (ref 135–145)

## 2022-01-04 LAB — SURGICAL PCR SCREEN
MRSA, PCR: NEGATIVE
Staphylococcus aureus: NEGATIVE

## 2022-01-04 LAB — POCT ACTIVATED CLOTTING TIME: Activated Clotting Time: 173 seconds

## 2022-01-04 LAB — HEPARIN LEVEL (UNFRACTIONATED): Heparin Unfractionated: 0.29 IU/mL — ABNORMAL LOW (ref 0.30–0.70)

## 2022-01-04 LAB — GLUCOSE, CAPILLARY
Glucose-Capillary: 91 mg/dL (ref 70–99)
Glucose-Capillary: 80 mg/dL (ref 70–99)
Glucose-Capillary: 125 mg/dL — ABNORMAL HIGH (ref 70–99)
Glucose-Capillary: 107 mg/dL — ABNORMAL HIGH (ref 70–99)

## 2022-01-04 MED ORDER — DARBEPOETIN ALFA 100 MCG/0.5ML IJ SOSY: 100.0000 ug | PREFILLED_SYRINGE | INTRAMUSCULAR | Status: DC

## 2022-01-04 NOTE — Progress Notes (Signed)
Progress Note   Patient: Jody Nguyen FUX:[id number] DOB: Jun 10, 1956 DOA: 01/21/2022     3 DOS: the patient was seen and examined on 01/04/2022   Brief hospital course: ANALIAH DRUM is a 66 year old female with past medical history significant for pulmonary hypertension followed by cardiology at [facility] health, chronic diastolic congestive heart failure, previous MV repair, nonobstructive CAD per cath done in 2016, hypertension, hyperlipidemia, ESRD on HD MWF, CVA, type 2 diabetes, history of PE on Eliquis who presented to [facility] ED on 2/4 with complaint of persistent chest pain. Patient states she is having episodes of chest pain this past week.  Reports nonexertional pain all across her chest with radiation to the back and throbbing in nature.  Each episode lasts about 10 minutes.  She has had episodes of chest pain after dialysis but having it even on nondialysis days.  Associated with nausea and dyspnea.  Denies history of blood clots. Last dialysis session was on Friday and she still makes urine.  She was seen in the ED on 12/28/2021 for chest pain and troponins were elevated at 100 but flat and consistent with previous.  She was slightly hypotensive and given fluid bolus.  EKG with no acute changes.  Seen by cardiology on 12/29/2021 and it was felt that her chest pain and troponin elevation were due to sudden fluid volume shift as chest pain occurred during dialysis.  Plan was to monitor her symptoms and discuss settings with HD prior to pursuing invasive cardiac catheterization.  In the ED, WBC 3.8 (slightly low on recent labs as well), hemoglobin 10.7 (stable), platelet count 130k.  Sodium 137, potassium 3.2, chloride 91, bicarb 32, BUN 29, creatinine 7.2, glucose 119.  EKG showing sinus rhythm, new LAFB and RBBB, QTC prolongation. High-sensitivity troponin elevated but stable (210 > 201). COVID and flu negative.  Chest x-ray showing no active disease. While in the ED, patient had an  episode of sinus tachycardia with rate in the 130s to 140s and became hypotensive with systolic in the 02R. 500 cc fluid bolus was given after which blood pressure improved and heart rate normalized.  ED physician discussed the case with cardiology, recommended admission for observation. TRH consulted.  Assessment and Plan: * Chest pain, Multivessel CAD- (present on admission) -Patient represented to the ED with persistent chest pain.   -Seen 4 days prior with noted troponins elevated at 100 but flat and consistent with previous.  -Cardiology at that time felt her symptoms were related to set fluid volume shifts during dialysis; however, she has continued to experience chest pain even on nondialysis days associated with dyspnea.  -EKG with new LAFB and RBBB.  -Cardiac catheterization done in 2016 showing nonobstructive CAD.   -Stress test done in October 2022 was intermediate risk.  - Cardiology was consulted by ED who recommended admission for observation.   -Patient did develop transient episode of tachycardia, hypotension and respiratory failure that resolved with IV fluid bolus and she was titrated off of oxygen.   -Nuclear medicine VQ scan with low probability pulmonary embolism.   -TTE with LVEF 40-45%, LV mildly decreased function, no regional wall motion abnormalities, moderate LVH, RVSP 39.6, LA mildly dilated, noted MV repair, severe TR, moderate TV stenosis.  -Underwent left heart catheterization 12/29/2021 with severe coronary calcification and multivessel CAD with LAD 99%, first diagonal 90%, ostial circumflex to proximal circumflex 100%, proximal RCA 100% stenosis. -Cardiology following, appreciate assistance -High-sensitivity troponin elevated 210 > 201 >194 >  189 > 207   -C/w Heparin drip (Eliquis outpatient) -Continue to Monitor on telemetry -Cardiology to consult CTS for CABG evaluation and Dr. Roxan Hockey evaluated and feels the patient would benefit from coronary for surgical  revascularization this will be considered high risk due to her multiple medical comorbidities as well as previous events of past surgical intervention related to her mitral valve repair  Coronary artery disease involving native coronary artery of native heart with unstable angina pectoris ([place])- (present on admission) -Left heart catheterization 01/18/2022 with severe coronary calcification and multivessel CAD with LAD 99%, first diagonal 90%, ostial circumflex to proximal circumflex 100%, proximal RCA 100% stenosed. -Cardiology to consult CTS for possible CABG; Patient is a candidate but High Riskc -CABG is scheduled for Friday 01/11/2022 -Continue Aspirin 81 mg po daily, Rousvastatin 20 mg po qHS, Metoprol Tartrate 12.5 mg po BID  Heparin drip  SVT (supraventricular tachycardia) ([place]) -Patient now with 2 episodes of SVT during hospitalization, improved with Valsalva maneuvers. -Cardiology following, appreciate assistance -Discontinued carvedilol, starting metoprolol tartrate 25 mg p.o. twice daily -Continue to monitor on telemetry  QT prolongation- (present on admission) -Initial EKG with QTC 556 and repeat was 517 ms -Avoid QT prolonging medications -Continue to Monitor on telemetry  History of stroke -C/w Aspirin 81 mg p.o. daily and Rosuvastatin 20 mg p.o. nightly -Will need PT/OT to evaluate and Treat  End stage renal disease on dialysis [facility]) Follows with nephrology outpatient, [name].  -Dialyzes at [facility] on a MWF schedule.   -Continue HD per nephrology while hospitalized -C/w Cinacalcet 90 mg po TID -C/w Darbepoetin Alfa 100 mcg IV q Wed -C/w Docercalcierol 9 mcg IV qMWF -Patient's BUN/Cr went from 49/110.38 -> 48/10.25 -Avoid nephrotoxic medications, contrast dyes, hypotension and Renally adjust medication -Continue to Monitor and Trend Renal Fxn Panel and repeat CMP in the AM  S/P minimally invasive mitral valve repair -Has had a previous mitral valve repair and  cardiology and cardiothoracic surgery is following  Chronic combined systolic and diastolic heart failure ([place])- (present on admission) -Stable.   -Currently No signs of volume overload.  - TTE with LVEF now 40-45%, LV mildly decreased function, no regional wall motion normalities, moderate LVH, RVSP 39.6, LA moderately dilated, MV repair noted, severe TR, moderate TV stenosis. -C/w HD for volume management -Strict I's and O's and daily weights -Cardiology is following and so is Nephro for Volume Management.   Pulmonary hypertension ([place])- (present on admission) -C/w Macitentan 10mg  PO daily -C/w Sildenafil 40mg  PO TID -Continue to Monitor Respiratory Status carefully  Obesity (BMI 30.0-34.9)- (present on admission) -Complicates overall prognosis and care -Estimated body mass index is 35.55 kg/m as calculated from the following:   Height as of this encounter: 4' 11.5" (1.511 m).   Weight as of this encounter: 81.2 kg.  -Weight Loss and Dietary Counseling given and discussed with patient needs for aggressive lifestyle changes/weight loss as this complicates all facets of care.   -Will need Outpatient follow-up with PCP.    Type 2 diabetes mellitus ([place]) -Diet controlled at home. -C/w Very Sensitive Novolog SSI AC/HS for coverage -CBGs qAC/HS -CBG's ranging from 80-139  Hyperlipidemia associated with type 2 diabetes mellitus ([place])- (present on admission) -C/w Rousvastatin 20 mg p.o. nightly  Essential hypertension- (present on admission) She had an episode of hypotension in the ED which resolved after a 500 cc fluid bolus.   -Blood pressure currently stable.   -Currently On carvedilol 12.5 mg p.o. twice daily, losartan 50  mg p.o. daily, Hydralazine 50mg  PO TID at home. -Was holding home antihypertensives currently due to transient hypotension on admission but has been started on Metoprolol Tartrate 12.5 mg po BID  -C/wHydralazine 25mg  PO q8h PRN SBP >180 or SBP >110 -Continue  monitor BP closely and per protocol -Last BP reading was 116/83  Subjective: Seen and examined sitting in the dialysis unit and denies any chest pain or shortness breath.  No nausea or vomiting.  Feels okay.  No other concerns requested this time  Physical Exam: Vitals:   01/04/22 1000 01/04/22 1030 01/04/22 1058 01/04/22 1134  BP: 121/73 115/71 115/76 116/83  Pulse: 80 78 79 81  Resp: 15 14 16 20   Temp:    (!) 97.1 F (36.2 C)  TempSrc:    Oral  SpO2:   100% 100%  Weight:      Height:       Examination: Physical Exam:  Constitutional: WN/WD OBESE AFRICAN-AMERICAN FEMALE CURRENTLY IN NAD and appears calm and comfortable Eyes: Lids and conjunctivae normal, sclerae anicteric  ENMT: External Ears, Nose appear normal. Grossly normal hearing. Mucous membranes are moist.   Neck: Appears normal, supple, no cervical masses, normal ROM, no appreciable thyromegaly; no appreciable JVD Respiratory: Diminished to auscultation bilaterally with coarse breath sounds, no wheezing, rales, rhonchi or crackles. Normal respiratory effort and patient is not tachypenic. No accessory muscle use.  Unlabored breathing Cardiovascular: RRR, slight 2 out of 6 murmur. S1 and S2 auscultated.  Minimal extremity edema Abdomen: Soft, non-tender, distended secondary body habitus. Bowel sounds positive.  GU: Deferred. Musculoskeletal: No clubbing / cyanosis of digits/nails. No joint deformity upper and lower extremities.  Skin: No rashes, lesions, ulcers on limited skin evaluation. No induration; Warm and dry.  Neurologic: CN 2-12 grossly intact with no focal deficits. Romberg sign and cerebellar reflexes not assessed.  Psychiatric: Normal judgment and insight. Alert and oriented x 3. Normal mood and appropriate affect.   Data Reviewed: I independently reviewed the patient's EKG, BMP, CBC  Family Communication: No family currently at bedside  Disposition: Status is: Inpatient Remains inpatient appropriate  because: Patient will need a CABG on Friday and need to be inpatient given high risk   Planned Discharge Destination:  To determine after CABG; anticipate discharging home but may need SNF  DVT prophylaxis: SCDs and heparin drip  Author: Raiford Noble, DO [facility] 01/04/2022 1:37 PM  For on call review [email or web address].

## 2022-01-04 NOTE — Progress Notes (Addendum)
Progress Note  Patient Name: Jody Nguyen Date of Encounter: 01/04/2022  [facility] Cardiologist: [name], MD   Subjective   Pt denies CP or dyspnea  Inpatient Medications    Scheduled Meds:  aspirin  81 mg Oral Daily   cinacalcet  90 mg Oral Once per day on Mon Wed Fri   doxercalciferol  9 mcg Intravenous Q M,W,F-HD   insulin aspart  0-5 Units Subcutaneous QHS   insulin aspart  0-6 Units Subcutaneous TID WC   latanoprost  1 drop Both Eyes QHS   macitentan  10 mg Oral Daily   metoprolol tartrate  12.5 mg Oral BID   rosuvastatin  20 mg Oral QHS   sildenafil  40 mg Oral TID   sodium chloride flush  3 mL Intravenous Q12H   sodium chloride flush  3 mL Intravenous Q12H   Continuous Infusions:  sodium chloride     heparin 1,200 Units/hr (01/04/22 0600)   PRN Meds: sodium chloride, acetaminophen **OR** acetaminophen, acetaminophen, diazepam, hydrALAZINE, pantoprazole, sodium chloride flush   Vital Signs    Vitals:   01/02/2022 1950 01/04/22 0422 01/04/22 0756 01/04/22 0830  BP: 102/69 (!) 92/59 96/81 (!) 110/59  Pulse: 81 77 78 79  Resp: (!) 24 20 20 20   Temp: (!) 97.2 F (36.2 C) 97.8 F (36.6 C) 97.8 F (36.6 C)   TempSrc: Oral Oral Oral   SpO2: 96% 92% 98%   Weight:      Height:        Intake/Output Summary (Last 24 hours) at 01/04/2022 0847 Last data filed at 01/04/2022 0600 Gross per 24 hour  Intake 620.12 ml  Output 296 ml  Net 324.12 ml    Last 3 Weights 12/30/2021 01/05/2022 01/01/2022  Weight (lbs) 179 lb 0.2 oz 179 lb 0.2 oz 169 lb 12.1 oz  Weight (kg) 81.2 kg 81.2 kg 77 kg      Telemetry    Sinus with occasional PVC - Personally Reviewed  Physical Exam   GEN: WD, NAD Neck: supple Cardiac: RRR Respiratory: CTA GI: Soft, NT/ND; right groin with no hematoma and no bruit MS: No edema Neuro:  Grossly intact Psych: Normal affect   Labs    High Sensitivity Troponin:   Recent Labs  Lab 01/17/2022 1853 01/05/2022 2052 01/01/22 0625  01/01/22 1211 01/01/22 1402  TROPONINIHS 210* 201* 194* 189* 207*      Chemistry Recent Labs  Lab 01/01/22 0625 01/01/22 1402 01/02/22 0704 01/11/2022 0415 01/04/22 0545  NA 137  --  134* 135 133*  K 3.8  --  3.7 3.9 3.8  CL 95*  --  92* 94* 92*  CO2 24  --  24 24 23   GLUCOSE 112*  --  109* 128* 94  BUN 30*  --  38* 49* 48*  CREATININE 7.94*  --  9.15* 10.38* 10.25*  CALCIUM 8.8*  --  8.6* 7.5* 8.2*  MG 2.3  --   --   --   --   PROT  --  7.7  --   --   --   ALBUMIN  --  3.8 3.4*  --  3.3*  AST  --  23  --   --   --   ALT  --  24  --   --   --   ALKPHOS  --  128*  --   --   --   BILITOT  --  0.8  --   --   --  GFRNONAA 5*  --  4* 4* 4*  ANIONGAP 18*  --  18* 17* 18*      Hematology Recent Labs  Lab 01/02/22 0704 01/05/2022 0415 01/04/22 0545  WBC 4.4 3.9* 3.8*  RBC 3.85* 3.74* 3.54*  HGB 10.8* 10.4* 9.7*  HCT 32.7* 31.7* 29.9*  MCV 84.9 84.8 84.5  MCH 28.1 27.8 27.4  MCHC 33.0 32.8 32.4  RDW 21.2* 21.2* 20.9*  PLT 118* 90* 97*    BNP Recent Labs  Lab 01/01/22 0941  BNP 1,432.4*     DDimer  Recent Labs  Lab 01/01/22 0940  DDIMER 1.22*      Radiology    CARDIAC CATHETERIZATION  Result Date: 12/29/2021   Prox LAD lesion is 99% stenosed.   1st Diag lesion is 90% stenosed.   Ost Cx to Prox Cx lesion is 100% stenosed.   Prox RCA lesion is 100% stenosed. Severe coronary calcification and multivessel CAD. The LAD is 99% noticed after giving rise to a optional diagonal like vessel that has 90% ostial stenosis.  The mid LAD has 30 and 70% mid distal stenoses. The left circumflex vessel was totally occluded at the ostium.  There is septal to the distal circumflex collateralization with filling of the vessel up to the proximal AV groove. The right coronary artery is totally occluded proximally.  There is bridging collaterals filling the RCA from the RV marginal branch downward. LVEDP 23 mm. RECOMMENDATION: We will obtain surgical consultation for consideration of  CABG revascularization.  The patient has good targets.  Reportedly, the patient had no ischemia on a prior nuclear study and a lot of anatomical findings, question if there was balanced ischemia at the time of her prior study.  Will resume heparin 8 hours post procedure.  NM Pulmonary Perf and Vent  Result Date: 01/02/2022 CLINICAL DATA:  Pulmonary embolism (PE) suspected, high prob EXAM: NUCLEAR MEDICINE PERFUSION LUNG SCAN TECHNIQUE: Perfusion images were obtained in multiple projections after intravenous injection of radiopharmaceutical. Ventilation scans intentionally deferred if perfusion scan and chest x-ray adequate for interpretation during COVID 19 epidemic. RADIOPHARMACEUTICALS:  4.1 mCi Tc-17m MAA IV COMPARISON:  Chest x-ray 01/02/2022 FINDINGS: Distribution of radiopharmaceutical within both lung fields. Large area of central photopenia related to cardiomegaly. No segmental perfusion defect. IMPRESSION: Low probability for pulmonary embolism. Electronically Signed   By: Davina Poke D.O.   On: 01/02/2022 16:25   ECHOCARDIOGRAM COMPLETE  Result Date: 01/02/2022    ECHOCARDIOGRAM REPORT   Patient Name:   Jody Nguyen Date of Exam: 01/02/2022 Medical Rec #:  [id number]        Height:       59.5 in Accession #:    [id number]       Weight:       169.8 lb Date of Birth:  1956/10/06         BSA:          1.730 m Patient Age:    66 years         BP:           103/74 mmHg Patient Gender: F                HR:           91 bpm. Exam Location:  Inpatient Procedure: 2D Echo, Cardiac Doppler and Color Doppler Indications:    Chest pain  History:        Patient has prior history of Echocardiogram examinations. CHF,  Pulmonary HTN; Risk Factors:Hypertension and Diabetes.                  Mitral Valve: 26 mm prosthetic annuloplasty ring valve is                 present in the mitral position.  Sonographer:    Jyl Heinz Referring Phys: [id number] [place]  1. Left ventricular  ejection fraction, by estimation, is 40 to 45%. The left ventricle has mildly decreased function. The left ventricle has no regional wall motion abnormalities. There is moderate left ventricular hypertrophy. Left ventricular diastolic parameters are indeterminate.  2. Right ventricular systolic function is mildly reduced. The right ventricular size is mildly enlarged. There is mildly elevated pulmonary artery systolic pressure. The estimated right ventricular systolic pressure is 17.4 mmHg.  3. Left atrial size was mildly dilated.  4. The mitral valve has been repaired/replaced. No evidence of mitral valve regurgitation. Moderate mitral stenosis. The mean mitral valve gradient is 8.5 mmHg. There is a 26 mm prosthetic annuloplasty ring present in the mitral position.  5. Tricuspid valve regurgitation is severe. Moderate tricuspid stenosis.  6. The aortic valve is normal in structure. Aortic valve regurgitation is not visualized. No aortic stenosis is present.  7. The inferior vena cava is dilated in size with <50% respiratory variability, suggesting right atrial pressure of 15 mmHg. Comparison(s): No significant change from prior study. Prior images reviewed side by side. FINDINGS  Left Ventricle: Left ventricular ejection fraction, by estimation, is 40 to 45%. The left ventricle has mildly decreased function. The left ventricle has no regional wall motion abnormalities. The left ventricular internal cavity size was normal in size. There is moderate left ventricular hypertrophy. Left ventricular diastolic parameters are indeterminate. Right Ventricle: The right ventricular size is mildly enlarged. No increase in right ventricular wall thickness. Right ventricular systolic function is mildly reduced. There is mildly elevated pulmonary artery systolic pressure. The tricuspid regurgitant  velocity is 2.48 m/s, and with an assumed right atrial pressure of 15 mmHg, the estimated right ventricular systolic pressure is 08.1  mmHg. Left Atrium: Left atrial size was mildly dilated. Right Atrium: Right atrial size was normal in size. Pericardium: There is no evidence of pericardial effusion. Mitral Valve: The mitral valve has been repaired/replaced. There is severe thickening of the mitral valve leaflet(s). There is severe calcification of the mitral valve leaflet(s). Mildly decreased mobility of the mitral valve leaflets. No evidence of mitral valve regurgitation. There is a 26 mm prosthetic annuloplasty ring present in the mitral position. Moderate mitral valve stenosis. MV peak gradient, 14.7 mmHg. The mean mitral valve gradient is 8.5 mmHg. Tricuspid Valve: The tricuspid valve is normal in structure. Tricuspid valve regurgitation is severe. Moderate tricuspid stenosis. Aortic Valve: The aortic valve is normal in structure. Aortic valve regurgitation is not visualized. Aortic regurgitation PHT measures 359 msec. No aortic stenosis is present. Aortic valve peak gradient measures 5.8 mmHg. Pulmonic Valve: The pulmonic valve was normal in structure. Pulmonic valve regurgitation is trivial. No evidence of pulmonic stenosis. Aorta: The aortic root is normal in size and structure. Venous: The inferior vena cava is dilated in size with less than 50% respiratory variability, suggesting right atrial pressure of 15 mmHg. IAS/Shunts: There is redundancy of the interatrial septum. No atrial level shunt detected by color flow Doppler.  LEFT VENTRICLE PLAX 2D LVIDd:         5.10 cm      Diastology LVIDs:  4.40 cm      LV e' medial:    6.42 cm/s LV PW:         1.70 cm      LV E/e' medial:  24.1 LV IVS:        1.20 cm      LV e' lateral:   7.40 cm/s LVOT diam:     2.00 cm      LV E/e' lateral: 20.9 LV SV:         47 LV SV Index:   27 LVOT Area:     3.14 cm  LV Volumes (MOD) LV vol d, MOD A2C: 147.0 ml LV vol d, MOD A4C: 95.3 ml LV vol s, MOD A2C: 75.7 ml LV vol s, MOD A4C: 48.8 ml LV SV MOD A2C:     71.3 ml LV SV MOD A4C:     95.3 ml LV SV MOD  BP:      59.2 ml RIGHT VENTRICLE            IVC RV Basal diam:  4.50 cm    IVC diam: 2.70 cm RV Mid diam:    4.00 cm RV S prime:     5.98 cm/s TAPSE (M-mode): 0.9 cm LEFT ATRIUM           Index        RIGHT ATRIUM           Index LA diam:      4.80 cm 2.77 cm/m   RA Area:     28.90 cm LA Vol (A2C): 61.3 ml 35.43 ml/m  RA Volume:   109.00 ml 63.00 ml/m LA Vol (A4C): 64.3 ml 37.16 ml/m  AORTIC VALVE AV Area (Vmax): 2.22 cm AV Vmax:        120.00 cm/s AV Peak Grad:   5.8 mmHg LVOT Vmax:      84.70 cm/s LVOT Vmean:     58.200 cm/s LVOT VTI:       0.149 m AI PHT:         359 msec  AORTA Ao Root diam: 2.60 cm Ao Asc diam:  3.30 cm MITRAL VALVE                TRICUSPID VALVE MV Area (PHT): 3.01 cm     TR Peak grad:   24.6 mmHg MV Area VTI:   1.08 cm     TR Vmax:        248.00 cm/s MV Peak grad:  14.7 mmHg MV Mean grad:  8.5 mmHg     SHUNTS MV Vmax:       1.92 m/s     Systemic VTI:  0.15 m MV Vmean:      140.0 cm/s   Systemic Diam: 2.00 cm MV Decel Time: 252 msec MV E velocity: 155.00 cm/s MV A velocity: 179.00 cm/s MV E/A ratio:  0.87 Candee Furbish MD Electronically signed by Candee Furbish MD Signature Date/Time: 01/02/2022/2:17:58 PM    Final     Patient Profile     66 year old female with past medical history of previous mitral valve repair, pulmonary hypertension, nonobstructive coronary disease, chronic diastolic congestive heart failure, hypertension, hyperlipidemia, end-stage renal disease for evaluation of chest pain and SVT.  Patient had a nuclear study in October that showed no ischemia.  Echocardiogram October 2022 showed ejection fraction 50 to 55%, mild RVE/RV dysfunction, previous mitral valve repair with mean gradient 9 mmHg suggestive of moderate mitral stenosis, mild mitral regurgitation and  moderate tricuspid regurgitation.  Right heart catheterization at [facility] October 2022 showed PA pressure 50/22 with pulmonary capillary wedge pressure 18.  Patient is complaining of intermittent chest pain.  It  is substernal radiating to her back.  It is not exertional.  Typically last approximately 10 minutes.  It is associated with heart racing.  She otherwise has some fatigue and dyspnea on exertion but no orthopnea, PND or pedal edema.  She has been admitted and cardiology asked to evaluate.  Assessment & Plan    1 unstable angina-patient remains pain-free this morning.  Catheterization results noted.  She has severe three-vessel coronary disease and coronary artery bypass and graft is planned for Friday morning.  Continue aspirin, heparin, metoprolol and Crestor.     2 SVT-patient has not had recurrent SVT at this point.  We will continue low-dose metoprolol and follow on telemetry.     3 pulmonary hypertension-this is longstanding.  We will continue home dose of revatio and opsumit.  Patient is followed at [facility].  Note patient had right heart catheterization October 2022 which showed PA pressure 50/23, pulmonary capillary wedge pressure 25, PVR 1.9.   4 history of pulmonary embolus-resume apixaban prior to discharge.   5 history of mitral valve repair-follow-up echocardiogram shows no mitral regurgitation and moderate mitral stenosis.   6 end-stage renal disease-dialysis per nephrology.  For questions or updates, please contact [facility] Please consult [email or web address] for contact info under        Signed, [name], MD  01/04/2022, 8:47 AM

## 2022-01-04 NOTE — Progress Notes (Signed)
[facility] Progress Note   Subjective:   Patient seen and examined today during dialysis.  Tolerating treatment well so far.  Swelling in RUE following infiltration yesterday, denies pain and numbness.  Advised to elevated after dialysis.  Denies CP, SOB, abdominal pain, and n/v/d.  Objective Vitals:   01/05/2022 1950 01/04/22 0422 01/04/22 0756 01/04/22 0830  BP: 102/69 (!) 92/59 96/81 (!) 110/59  Pulse: 81 77 78 79  Resp: (!) 24 20 20 20   Temp: (!) 97.2 F (36.2 C) 97.8 F (36.6 C) 97.8 F (36.6 C)   TempSrc: Oral Oral Oral   SpO2: 96% 92% 98%   Weight:      Height:       Physical Exam General:well appearing female in NAD Heart:RRR, no mrg Lungs:nml WOB on 2L via [place], CTAB anterolaterally  Abdomen:soft, NTND Extremities:no LE edema Dialysis Access:RU AVF cannulated    Filed Weights   01/01/22 0153 01/01/2022 1329 01/13/2022 1434  Weight: 77 kg 81.2 kg 81.2 kg    Intake/Output Summary (Last 24 hours) at 01/04/2022 0848 Last data filed at 01/04/2022 0600 Gross per 24 hour  Intake 620.12 ml  Output 296 ml  Net 324.12 ml    Additional Objective Labs: Basic Metabolic Panel: Recent Labs  Lab 01/02/22 0704 01/02/2022 0415 01/04/22 0545  NA 134* 135 133*  K 3.7 3.9 3.8  CL 92* 94* 92*  CO2 24 24 23   GLUCOSE 109* 128* 94  BUN 38* 49* 48*  CREATININE 9.15* 10.38* 10.25*  CALCIUM 8.6* 7.5* 8.2*  PHOS 7.5*  --  7.0*   Liver Function Tests: Recent Labs  Lab 01/01/22 1402 01/02/22 0704 01/04/22 0545  AST 23  --   --   ALT 24  --   --   ALKPHOS 128*  --   --   BILITOT 0.8  --   --   PROT 7.7  --   --   ALBUMIN 3.8 3.4* 3.3*   CBC: Recent Labs  Lab 12/28/21 1101 01/17/2022 1853 01/02/22 0704 01/15/2022 0415 01/04/22 0545  WBC 4.1 3.8* 4.4 3.9* 3.8*  HGB 11.2* 10.7* 10.8* 10.4* 9.7*  HCT 35.6* 34.9* 32.7* 31.7* 29.9*  MCV 85.4 87.0 84.9 84.8 84.5  PLT 191 130* 118* 90* 97*   CBG: Recent Labs  Lab 01/05/2022 1157 01/12/2022 1252 12/30/2021 1603  01/12/2022 2039 01/04/22 0721  GLUCAP 124* 101* 85 139* 91   Studies/Results: CARDIAC CATHETERIZATION  Result Date: 01/05/2022   Prox LAD lesion is 99% stenosed.   1st Diag lesion is 90% stenosed.   Ost Cx to Prox Cx lesion is 100% stenosed.   Prox RCA lesion is 100% stenosed. Severe coronary calcification and multivessel CAD. The LAD is 99% noticed after giving rise to a optional diagonal like vessel that has 90% ostial stenosis.  The mid LAD has 30 and 70% mid distal stenoses. The left circumflex vessel was totally occluded at the ostium.  There is septal to the distal circumflex collateralization with filling of the vessel up to the proximal AV groove. The right coronary artery is totally occluded proximally.  There is bridging collaterals filling the RCA from the RV marginal branch downward. LVEDP 23 mm. RECOMMENDATION: We will obtain surgical consultation for consideration of CABG revascularization.  The patient has good targets.  Reportedly, the patient had no ischemia on a prior nuclear study and a lot of anatomical findings, question if there was balanced ischemia at the time of her prior study.  Will resume  heparin 8 hours post procedure.  NM Pulmonary Perf and Vent  Result Date: 01/02/2022 CLINICAL DATA:  Pulmonary embolism (PE) suspected, high prob EXAM: NUCLEAR MEDICINE PERFUSION LUNG SCAN TECHNIQUE: Perfusion images were obtained in multiple projections after intravenous injection of radiopharmaceutical. Ventilation scans intentionally deferred if perfusion scan and chest x-ray adequate for interpretation during COVID 19 epidemic. RADIOPHARMACEUTICALS:  4.1 mCi Tc-1m MAA IV COMPARISON:  Chest x-ray 01/02/2022 FINDINGS: Distribution of radiopharmaceutical within both lung fields. Large area of central photopenia related to cardiomegaly. No segmental perfusion defect. IMPRESSION: Low probability for pulmonary embolism. Electronically Signed   By: Davina Poke D.O.   On: 01/02/2022 16:25    ECHOCARDIOGRAM COMPLETE  Result Date: 01/02/2022    ECHOCARDIOGRAM REPORT   Patient Name:   Jody Nguyen Date of Exam: 01/02/2022 Medical Rec #:  [id number]        Height:       59.5 in Accession #:    [id number]       Weight:       169.8 lb Date of Birth:  17-Jan-1956         BSA:          1.730 m Patient Age:    66 years         BP:           103/74 mmHg Patient Gender: F                HR:           91 bpm. Exam Location:  Inpatient Procedure: 2D Echo, Cardiac Doppler and Color Doppler Indications:    Chest pain  History:        Patient has prior history of Echocardiogram examinations. CHF,                 Pulmonary HTN; Risk Factors:Hypertension and Diabetes.                  Mitral Valve: 26 mm prosthetic annuloplasty ring valve is                 present in the mitral position.  Sonographer:    Jyl Heinz Referring Phys: [id number] [place]  1. Left ventricular ejection fraction, by estimation, is 40 to 45%. The left ventricle has mildly decreased function. The left ventricle has no regional wall motion abnormalities. There is moderate left ventricular hypertrophy. Left ventricular diastolic parameters are indeterminate.  2. Right ventricular systolic function is mildly reduced. The right ventricular size is mildly enlarged. There is mildly elevated pulmonary artery systolic pressure. The estimated right ventricular systolic pressure is 28.4 mmHg.  3. Left atrial size was mildly dilated.  4. The mitral valve has been repaired/replaced. No evidence of mitral valve regurgitation. Moderate mitral stenosis. The mean mitral valve gradient is 8.5 mmHg. There is a 26 mm prosthetic annuloplasty ring present in the mitral position.  5. Tricuspid valve regurgitation is severe. Moderate tricuspid stenosis.  6. The aortic valve is normal in structure. Aortic valve regurgitation is not visualized. No aortic stenosis is present.  7. The inferior vena cava is dilated in size with <50% respiratory  variability, suggesting right atrial pressure of 15 mmHg. Comparison(s): No significant change from prior study. Prior images reviewed side by side. FINDINGS  Left Ventricle: Left ventricular ejection fraction, by estimation, is 40 to 45%. The left ventricle has mildly decreased function. The left ventricle has no regional wall motion abnormalities. The left ventricular internal  cavity size was normal in size. There is moderate left ventricular hypertrophy. Left ventricular diastolic parameters are indeterminate. Right Ventricle: The right ventricular size is mildly enlarged. No increase in right ventricular wall thickness. Right ventricular systolic function is mildly reduced. There is mildly elevated pulmonary artery systolic pressure. The tricuspid regurgitant  velocity is 2.48 m/s, and with an assumed right atrial pressure of 15 mmHg, the estimated right ventricular systolic pressure is 08.1 mmHg. Left Atrium: Left atrial size was mildly dilated. Right Atrium: Right atrial size was normal in size. Pericardium: There is no evidence of pericardial effusion. Mitral Valve: The mitral valve has been repaired/replaced. There is severe thickening of the mitral valve leaflet(s). There is severe calcification of the mitral valve leaflet(s). Mildly decreased mobility of the mitral valve leaflets. No evidence of mitral valve regurgitation. There is a 26 mm prosthetic annuloplasty ring present in the mitral position. Moderate mitral valve stenosis. MV peak gradient, 14.7 mmHg. The mean mitral valve gradient is 8.5 mmHg. Tricuspid Valve: The tricuspid valve is normal in structure. Tricuspid valve regurgitation is severe. Moderate tricuspid stenosis. Aortic Valve: The aortic valve is normal in structure. Aortic valve regurgitation is not visualized. Aortic regurgitation PHT measures 359 msec. No aortic stenosis is present. Aortic valve peak gradient measures 5.8 mmHg. Pulmonic Valve: The pulmonic valve was normal in  structure. Pulmonic valve regurgitation is trivial. No evidence of pulmonic stenosis. Aorta: The aortic root is normal in size and structure. Venous: The inferior vena cava is dilated in size with less than 50% respiratory variability, suggesting right atrial pressure of 15 mmHg. IAS/Shunts: There is redundancy of the interatrial septum. No atrial level shunt detected by color flow Doppler.  LEFT VENTRICLE PLAX 2D LVIDd:         5.10 cm      Diastology LVIDs:         4.40 cm      LV e' medial:    6.42 cm/s LV PW:         1.70 cm      LV E/e' medial:  24.1 LV IVS:        1.20 cm      LV e' lateral:   7.40 cm/s LVOT diam:     2.00 cm      LV E/e' lateral: 20.9 LV SV:         47 LV SV Index:   27 LVOT Area:     3.14 cm  LV Volumes (MOD) LV vol d, MOD A2C: 147.0 ml LV vol d, MOD A4C: 95.3 ml LV vol s, MOD A2C: 75.7 ml LV vol s, MOD A4C: 48.8 ml LV SV MOD A2C:     71.3 ml LV SV MOD A4C:     95.3 ml LV SV MOD BP:      59.2 ml RIGHT VENTRICLE            IVC RV Basal diam:  4.50 cm    IVC diam: 2.70 cm RV Mid diam:    4.00 cm RV S prime:     5.98 cm/s TAPSE (M-mode): 0.9 cm LEFT ATRIUM           Index        RIGHT ATRIUM           Index LA diam:      4.80 cm 2.77 cm/m   RA Area:     28.90 cm LA Vol (A2C): 61.3 ml 35.43 ml/m  RA Volume:  109.00 ml 63.00 ml/m LA Vol (A4C): 64.3 ml 37.16 ml/m  AORTIC VALVE AV Area (Vmax): 2.22 cm AV Vmax:        120.00 cm/s AV Peak Grad:   5.8 mmHg LVOT Vmax:      84.70 cm/s LVOT Vmean:     58.200 cm/s LVOT VTI:       0.149 m AI PHT:         359 msec  AORTA Ao Root diam: 2.60 cm Ao Asc diam:  3.30 cm MITRAL VALVE                TRICUSPID VALVE MV Area (PHT): 3.01 cm     TR Peak grad:   24.6 mmHg MV Area VTI:   1.08 cm     TR Vmax:        248.00 cm/s MV Peak grad:  14.7 mmHg MV Mean grad:  8.5 mmHg     SHUNTS MV Vmax:       1.92 m/s     Systemic VTI:  0.15 m MV Vmean:      140.0 cm/s   Systemic Diam: 2.00 cm MV Decel Time: 252 msec MV E velocity: 155.00 cm/s MV A velocity: 179.00 cm/s  MV E/A ratio:  0.87 Candee Furbish MD Electronically signed by Candee Furbish MD Signature Date/Time: 01/02/2022/2:17:58 PM    Final     Medications:  sodium chloride     heparin 1,200 Units/hr (01/04/22 0600)    aspirin  81 mg Oral Daily   cinacalcet  90 mg Oral Once per day on Mon Wed Fri   doxercalciferol  9 mcg Intravenous Q M,W,F-HD   insulin aspart  0-5 Units Subcutaneous QHS   insulin aspart  0-6 Units Subcutaneous TID WC   latanoprost  1 drop Both Eyes QHS   macitentan  10 mg Oral Daily   metoprolol tartrate  12.5 mg Oral BID   rosuvastatin  20 mg Oral QHS   sildenafil  40 mg Oral TID   sodium chloride flush  3 mL Intravenous Q12H   sodium chloride flush  3 mL Intravenous Q12H    Dialysis Orders: MWF at [facility] 3:15hr, 450/A1.5, EDW 75.5kg, 2K/2Ca, UFP #2, AVF, heparin 2500 unit bolus - Hectoral 74mcg IV q HD - Sensipar 90mg  PO q HD - Mircera 11mcg IV q 2 weeks (ordered, not yet given. Last Mircera 100 on 12/07/21)   Assessment/Plan:  Intermittent chest pain with radiation to back/dyspnea: Trops elevated, but trend is stable. No PE on VQ scan.  On heparin drip. Episodes of SVT, changed to metoprolol per cardiology and may require ablation if symptoms not controlled.  LHC with severe multivessel disease.  Plan for CABG per CTS.  per PMD/cardiology.  ESRD:  Continue HD on MWF schedule - HD today per regular schedule.  Unable to complete treatment yesterday due infiltration, arm swollen today but nontender.  Advised to elevated once dialysis complete.    Hypertension/volume: BP soft this AM.  Cardiology changed carvedilol to metoprolol and decreased dose to 12.5mg  BID.  About 5L over dry weight this AM, plan for UF as tolerated.   Anemia: Hgb 9.7, order aranesp to be given with HD today.   Metabolic bone disease: Ca ok, Phos elevated. Continue home binders, VDRA, sensipar. Nutrition - renal diet w/fluid restrictions.   Pulm HTN:  Followed at [facility], on Revatio and opsumit  T2DM CAD/MVR 2016  - severe multivessel disease and severe TR noted on LHC.  Plan for  CABG later this week.   Jen Mow, PA-C [facility] 01/04/2022,8:48 AM  LOS: 3 days

## 2022-01-04 NOTE — Assessment & Plan Note (Signed)
/  GI Prophylaxis -C/w Pantoprazole 40 mg po Daily PRN GERD

## 2022-01-04 NOTE — Progress Notes (Signed)
1 Day Post-Op Procedure(s) (LRB): LEFT HEART CATH AND CORONARY ANGIOGRAPHY (N/A) Subjective: HD earlier today No CP or SOB  Objective: Vital signs in last 24 hours: Temp:  [97.1 F (36.2 C)-97.8 F (36.6 C)] 97.1 F (36.2 C) (02/08 1134) Pulse Rate:  [77-81] 81 (02/08 1134) Cardiac Rhythm: Heart block (02/07 1907) Resp:  [14-24] 20 (02/08 1134) BP: (80-121)/(59-83) 116/83 (02/08 1134) SpO2:  [92 %-100 %] 100 % (02/08 1134)  Hemodynamic parameters for last 24 hours:    Intake/Output from previous day: 02/07 0701 - 02/08 0700 In: 620.1 [P.O.:510; I.V.:110.1] Out: 296 [Urine:150] Intake/Output this shift: Total I/O In: -  Out: 2100 [Other:2100]  General appearance: alert, cooperative, and no distress Neurologic: intact Heart: regular rate and rhythm Lungs: clear to auscultation bilaterally  Lab Results: Recent Labs    12/29/2021 0415 01/04/22 0545  WBC 3.9* 3.8*  HGB 10.4* 9.7*  HCT 31.7* 29.9*  PLT 90* 97*   BMET:  Recent Labs    01/18/2022 0415 01/04/22 0545  NA 135 133*  K 3.9 3.8  CL 94* 92*  CO2 24 23  GLUCOSE 128* 94  BUN 49* 48*  CREATININE 10.38* 10.25*  CALCIUM 7.5* 8.2*    PT/INR: No results for input(s): LABPROT, INR in the last 72 hours. ABG    Component Value Date/Time   PHART 7.415 07/08/2015 2240   HCO3 21.7 07/08/2015 2240   TCO2 34 (H) 08/16/2017 0630   ACIDBASEDEF 2.0 07/08/2015 2240   O2SAT 100.0 07/08/2015 2240   CBG (last 3)  Recent Labs    01/17/2022 2039 01/04/22 0721 01/04/22 1132  GLUCAP 139* 91 80    Assessment/Plan: S/P Procedure(s) (LRB): LEFT HEART CATH AND CORONARY ANGIOGRAPHY (N/A) -  Severe 3 vessel CAD, severe TR- for high risk CABG, possible TVR Friday 2/10 She tolerated hd today All questions answered. She is aware of the high risk nature of the procedure   LOS: 3 days    Jody Nguyen 01/04/2022

## 2022-01-04 NOTE — Assessment & Plan Note (Addendum)
Continue renal replacement therapy per nephrology recommendations. Positive acute rectal bleeding today, her hgb is 7,8 and hct is 25.8 Check H&H this am, may need PRBC transfusion.

## 2022-01-04 NOTE — Assessment & Plan Note (Addendum)
-  Patient's Na+ went from 135 -> 133 -> 134 -Continue to Monitor and Trend and expect to be corrected in Dialysis -Repeat CMP in the AM

## 2022-01-04 NOTE — Progress Notes (Signed)
ANTICOAGULATION CONSULT NOTE - Follow Up Consult  Pharmacy Consult for heparin  Indication: hx PE  Allergies  Allergen Reactions   Minoxidil Other (See Comments)    Pericardial effusion   Lipitor [Atorvastatin] Other (See Comments)    MYALGIAS > "pain in legs" Tolerates rosuvastatin   Morphine And Related Itching   Ace Inhibitors Other (See Comments)    REACTION: "not sure...think it made me drowsy all the time"    Patient Measurements: Height: 4' 11.5" (151.1 cm) Weight: 81.2 kg (179 lb 0.2 oz) IBW/kg (Calculated) : 44.35 Heparin Dosing Weight: 61.9 kg  Vital Signs: Temp: 97.8 F (36.6 C) (02/08 0422) Temp Source: Oral (02/08 0422) BP: 92/59 (02/08 0422) Pulse Rate: 77 (02/08 0422)  Labs: Recent Labs    01/01/22 1211 01/01/22 1402 01/01/22 2230 01/02/22 0704 01/14/2022 0415 01/04/22 0545  HGB  --   --    < > 10.8* 10.4* 9.7*  HCT  --   --   --  32.7* 31.7* 29.9*  PLT  --   --   --  118* 90* 97*  APTT  --  88*  --   --   --   --   HEPARINUNFRC  --  0.28*   < > 0.42 0.54 0.29*  CREATININE  --   --   --  9.15* 10.38* 10.25*  TROPONINIHS 189* 207*  --   --   --   --    < > = values in this interval not displayed.     Estimated Creatinine Clearance: 5 mL/min (A) (by C-G formula based on SCr of 10.25 mg/dL (H)).  Assessment: 21 yoF admitted with CP and intermittent SOB. Pt on apixaban for hx PE, transitioned to IV heparin for ischemic and PE workup. VQ scan low probability PE 2/6, LHC with mvCAD 2/7.  Heparin level slightly below goal at 0.29, H/H stable, pltc improved.  Goal of Therapy:  Heparin level 0.3-0.7 units/ml Monitor platelets by anticoagulation protocol: Yes   Plan:  Increase heparin to 1250 units/h Daily heparin level and CBC   Arrie Senate, PharmD, [place], [facility] Clinical Pharmacist [phone] Please check AMION for all [facility] numbers 01/04/2022

## 2022-01-04 NOTE — Progress Notes (Signed)
°   01/04/22 2207  Assess: MEWS Score  BP (!) 80/49  Pulse Rate 96  ECG Heart Rate 91  Resp 20  SpO2 99 %  O2 Device Nasal Cannula  O2 Flow Rate (L/min) 3.5 L/min  Assess: MEWS Score  MEWS Temp 0  MEWS Systolic 2  MEWS Pulse 0  MEWS RR 0  MEWS LOC 0  MEWS Score 2  MEWS Score Color Yellow  Assess: if the MEWS score is Yellow or Red  Were vital signs taken at a resting state? Yes  Focused Assessment No change from prior assessment  Early Detection of Sepsis Score *See Row Information* Low  MEWS guidelines implemented *See Row Information* Yes  Escalate  MEWS: Escalate Yellow: discuss with charge nurse/RN and consider discussing with provider and RRT  Notify: Charge Nurse/RN  Name of Charge Nurse/RN Notified Debra RN  Date Charge Nurse/RN Notified 01/04/22  Time Charge Nurse/RN Notified 2212  Document  Patient Outcome Not stable and remains on department  Progress note created (see row info) Yes

## 2022-01-04 NOTE — Assessment & Plan Note (Signed)
-  Has had a previous mitral valve repair and cardiology and cardiothoracic surgery is following

## 2022-01-04 NOTE — TOC Initial Note (Signed)
Transition of Care [facility]) - Initial/Assessment Note    Patient Details  Name: Jody Nguyen MRN: [id number] Date of Birth: 03-14-1956  [facility]) CM/SW Contact:    Bethena Roys, RN Phone Number: 01/04/2022, 1:05 PM  Clinical Narrative:  Patient is listed as a risk for readmission assessment. Unable to apply the information for the risk for readmission tab. PTA patient was independent from home with support of spouse. Patient states she was not using any durable medical equipment (DME) in the home. Case Manager will continue to follow for additional transition of care needs as the patient progresses.    Expected Discharge Plan: [place] Barriers to Discharge: Continued Medical Work up   Patient Goals and CMS Choice Patient states their goals for this hospitalization and ongoing recovery are:: Patient plans to return home once stable.      Expected Discharge Plan and Services Expected Discharge Plan: [place] In-house Referral: NA Discharge Planning Services: CM Consult Post Acute Care Choice: [place] arrangements for the past 2 months: Single Family Home                   DME Agency: NA     Prior Living Arrangements/Services Living arrangements for the past 2 months: Single Family Home   Patient language and need for interpreter reviewed:: Yes        Need for Family Participation in Patient Care: Yes (Comment) Care giver support system in place?: Yes (comment)   Criminal Activity/Legal Involvement Pertinent to Current Situation/Hospitalization: No - Comment as needed  Activities of Daily Living Home Assistive Devices/Equipment: Blood pressure cuff, CBG Meter, Walker (specify type) ADL Screening (condition at time of admission) Patient's cognitive ability adequate to safely complete daily activities?: Yes Is the patient deaf or have difficulty hearing?: No Does the patient have difficulty  seeing, even when wearing glasses/contacts?: No Does the patient have difficulty concentrating, remembering, or making decisions?: No Patient able to express need for assistance with ADLs?: Yes Does the patient have difficulty dressing or bathing?: No Independently performs ADLs?: Yes (appropriate for developmental age) Does the patient have difficulty walking or climbing stairs?: No Weakness of Legs: Both Weakness of Arms/Hands: None  Permission Sought/Granted Permission sought to share information with : Family Supports, Customer service manager, Case Manager                Emotional Assessment Appearance:: Appears stated age Attitude/Demeanor/Rapport: Engaged Affect (typically observed): Appropriate Orientation: : Oriented to  Time, Oriented to Place, Oriented to Self, Oriented to Situation Alcohol / Substance Use: Not Applicable Psych Involvement: No (comment)  Admission diagnosis:  Tachycardia [R00.0] End stage renal disease on dialysis ([place]) [N18.6, Z99.2] Chest pain [R07.9] Chest pain, unspecified type [R07.9] Patient Active Problem List   Diagnosis Date Noted   Coronary artery disease involving native coronary artery of native heart with unstable angina pectoris ([place])    SVT (supraventricular tachycardia) ([place]) 01/02/2022   History of stroke 01/01/2022   QT prolongation 01/01/2022   Chest pain, Multivessel CAD 08/28/2021   Patellofemoral arthritis of left knee 04/21/2021   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   Secondary renal hyperparathyroidism ([place]) 07/28/2019   Major depression in remission ([place]) 01/22/2019   End stage renal disease on dialysis ([place]) 10/09/2015   Cardiomyopathy ([place]) 09/10/2015   Atrial fibrillation ([place]) 09/10/2015   S/P minimally invasive mitral valve repair 07/08/2015   Chronic periodontitis 06/30/2015   Chronic combined systolic  and diastolic heart failure ([place])    Pulmonary hypertension ([place]) 06/18/2015   Obesity (BMI 30.0-34.9)  03/29/2015   Type 2 diabetes mellitus ([place]) 11/22/2014   Vitamin D deficiency 03/25/2014   Anemia in chronic kidney disease    Essential hypertension 08/17/2011   Hyperlipidemia associated with type 2 diabetes mellitus ([place]) 08/17/2011   PCP:  Unk Pinto, MD Pharmacy:   [facility] ([place]) [place], [place] [address] Phone: [phone] Fax: [phone]  [facility] ([place]), [place] - [address] [place] ([place])  [address] Phone: [phone] Fax: [phone]  [id number] - [place], [place] - [address] [place] [address] Phone: [phone] Fax: [phone]  [facility] ([place] [address] [address] [place] [address] Phone: [phone] Fax: [phone]   Readmission Risk Interventions No flowsheet data found.

## 2022-01-05 ENCOUNTER — Inpatient Hospital Stay (HOSPITAL_COMMUNITY): Payer: Medicare Other

## 2022-01-05 ENCOUNTER — Other Ambulatory Visit (HOSPITAL_COMMUNITY): Payer: Medicare Other

## 2022-01-05 DIAGNOSIS — M7989 Other specified soft tissue disorders: Secondary | ICD-10-CM | POA: Diagnosis not present

## 2022-01-05 DIAGNOSIS — Z01810 Encounter for preprocedural cardiovascular examination: Secondary | ICD-10-CM

## 2022-01-05 DIAGNOSIS — I5042 Chronic combined systolic (congestive) and diastolic (congestive) heart failure: Secondary | ICD-10-CM | POA: Diagnosis not present

## 2022-01-05 DIAGNOSIS — R600 Localized edema: Secondary | ICD-10-CM | POA: Diagnosis not present

## 2022-01-05 DIAGNOSIS — N186 End stage renal disease: Secondary | ICD-10-CM | POA: Diagnosis not present

## 2022-01-05 DIAGNOSIS — R072 Precordial pain: Secondary | ICD-10-CM | POA: Diagnosis not present

## 2022-01-05 DIAGNOSIS — I200 Unstable angina: Secondary | ICD-10-CM | POA: Diagnosis not present

## 2022-01-05 DIAGNOSIS — N189 Chronic kidney disease, unspecified: Secondary | ICD-10-CM | POA: Diagnosis not present

## 2022-01-05 DIAGNOSIS — T82590A Other mechanical complication of surgically created arteriovenous fistula, initial encounter: Secondary | ICD-10-CM | POA: Diagnosis not present

## 2022-01-05 LAB — PROTIME-INR
INR: 1 (ref 0.8–1.2)
Prothrombin Time: 13.5 seconds (ref 11.4–15.2)

## 2022-01-05 LAB — BLOOD GAS, ARTERIAL
Acid-Base Excess: 4.5 mmol/L — ABNORMAL HIGH (ref 0.0–2.0)
Bicarbonate: 28.4 mmol/L — ABNORMAL HIGH (ref 20.0–28.0)
FIO2: 100
O2 Saturation: 90.9 %
Patient temperature: 37
pCO2 arterial: 41.3 mmHg (ref 32.0–48.0)
pH, Arterial: 7.452 — ABNORMAL HIGH (ref 7.350–7.450)
pO2, Arterial: 59.6 mmHg — ABNORMAL LOW (ref 83.0–108.0)

## 2022-01-05 LAB — RETICULOCYTES
Immature Retic Fract: 29.7 % — ABNORMAL HIGH (ref 2.3–15.9)
RBC.: 3.52 MIL/uL — ABNORMAL LOW (ref 3.87–5.11)
Retic Count, Absolute: 111.2 10*3/uL (ref 19.0–186.0)
Retic Ct Pct: 3.2 % — ABNORMAL HIGH (ref 0.4–3.1)

## 2022-01-05 LAB — CBC WITH DIFFERENTIAL/PLATELET
Abs Immature Granulocytes: 0.02 10*3/uL (ref 0.00–0.07)
Basophils Absolute: 0 10*3/uL (ref 0.0–0.1)
Basophils Relative: 1 %
Eosinophils Absolute: 0.2 10*3/uL (ref 0.0–0.5)
Eosinophils Relative: 3 %
HCT: 30.7 % — ABNORMAL LOW (ref 36.0–46.0)
Hemoglobin: 9.8 g/dL — ABNORMAL LOW (ref 12.0–15.0)
Immature Granulocytes: 0 %
Lymphocytes Relative: 25 %
Lymphs Abs: 1.1 10*3/uL (ref 0.7–4.0)
MCH: 27.6 pg (ref 26.0–34.0)
MCHC: 31.9 g/dL (ref 30.0–36.0)
MCV: 86.5 fL (ref 80.0–100.0)
Monocytes Absolute: 0.6 10*3/uL (ref 0.1–1.0)
Monocytes Relative: 13 %
Neutro Abs: 2.7 10*3/uL (ref 1.7–7.7)
Neutrophils Relative %: 58 %
Platelets: 94 10*3/uL — ABNORMAL LOW (ref 150–400)
RBC: 3.55 MIL/uL — ABNORMAL LOW (ref 3.87–5.11)
RDW: 21.3 % — ABNORMAL HIGH (ref 11.5–15.5)
Smear Review: DECREASED
WBC: 4.7 10*3/uL (ref 4.0–10.5)
nRBC: 0 % (ref 0.0–0.2)

## 2022-01-05 LAB — COMPREHENSIVE METABOLIC PANEL
ALT: 15 U/L (ref 0–44)
AST: 15 U/L (ref 15–41)
Albumin: 3.4 g/dL — ABNORMAL LOW (ref 3.5–5.0)
Alkaline Phosphatase: 90 U/L (ref 38–126)
Anion gap: 17 — ABNORMAL HIGH (ref 5–15)
BUN: 25 mg/dL — ABNORMAL HIGH (ref 8–23)
CO2: 23 mmol/L (ref 22–32)
Calcium: 8.1 mg/dL — ABNORMAL LOW (ref 8.9–10.3)
Chloride: 94 mmol/L — ABNORMAL LOW (ref 98–111)
Creatinine, Ser: 7.2 mg/dL — ABNORMAL HIGH (ref 0.44–1.00)
GFR, Estimated: 6 mL/min — ABNORMAL LOW (ref >60)
Glucose, Bld: 127 mg/dL — ABNORMAL HIGH (ref 70–99)
Potassium: 3.8 mmol/L (ref 3.5–5.1)
Sodium: 134 mmol/L — ABNORMAL LOW (ref 135–145)
Total Bilirubin: 0.7 mg/dL (ref 0.3–1.2)
Total Protein: 7 g/dL (ref 6.5–8.1)

## 2022-01-05 LAB — IRON AND TIBC
Iron: 69 ug/dL (ref 28–170)
Saturation Ratios: 25 % (ref 10.4–31.8)
TIBC: 276 ug/dL (ref 250–450)
UIBC: 207 ug/dL

## 2022-01-05 LAB — HEPATITIS B SURFACE ANTIBODY, QUANTITATIVE: Hep B S AB Quant (Post): 91.3 m[IU]/mL (ref >9.9)

## 2022-01-05 LAB — FERRITIN: Ferritin: 1300 ng/mL — ABNORMAL HIGH (ref 11–307)

## 2022-01-05 LAB — GLUCOSE, CAPILLARY
Glucose-Capillary: 131 mg/dL — ABNORMAL HIGH (ref 70–99)
Glucose-Capillary: 163 mg/dL — ABNORMAL HIGH (ref 70–99)
Glucose-Capillary: 113 mg/dL — ABNORMAL HIGH (ref 70–99)
Glucose-Capillary: 101 mg/dL — ABNORMAL HIGH (ref 70–99)
Glucose-Capillary: 121 mg/dL — ABNORMAL HIGH (ref 70–99)

## 2022-01-05 LAB — HEMOGLOBIN A1C
Hgb A1c MFr Bld: 5.6 % (ref 4.8–5.6)
Mean Plasma Glucose: 114.02 mg/dL

## 2022-01-05 LAB — FOLATE: Folate: 7.4 ng/mL (ref >5.9)

## 2022-01-05 LAB — PHOSPHORUS: Phosphorus: 4.3 mg/dL (ref 2.5–4.6)

## 2022-01-05 LAB — HEPARIN LEVEL (UNFRACTIONATED): Heparin Unfractionated: 0.48 IU/mL (ref 0.30–0.70)

## 2022-01-05 LAB — MAGNESIUM: Magnesium: 2.1 mg/dL (ref 1.7–2.4)

## 2022-01-05 LAB — VITAMIN B12: Vitamin B-12: 1498 pg/mL — ABNORMAL HIGH (ref 180–914)

## 2022-01-05 MED ORDER — MILRINONE LACTATE IN DEXTROSE 20-5 MG/100ML-% IV SOLN
0.3000 ug/kg/min | INTRAVENOUS | Status: AC
  Administered 2022-01-06: .25 ug/kg/min via INTRAVENOUS
  Filled 2022-01-05: qty 100

## 2022-01-05 MED ORDER — DEXMEDETOMIDINE HCL IN NACL 400 MCG/100ML IV SOLN
0.1000 ug/kg/h | INTRAVENOUS | Status: AC
  Administered 2022-01-06: .4 ug/kg/h via INTRAVENOUS
  Filled 2022-01-05: qty 100

## 2022-01-05 MED ORDER — DIAZEPAM 2 MG PO TABS
2.0000 mg | ORAL_TABLET | Freq: Once | ORAL | Status: AC
  Administered 2022-01-06: 2 mg via ORAL
  Filled 2022-01-05: qty 1

## 2022-01-05 MED ORDER — POTASSIUM CHLORIDE 2 MEQ/ML IV SOLN
80.0000 meq | INTRAVENOUS | Status: DC
  Filled 2022-01-05: qty 40

## 2022-01-05 MED ORDER — ALBUMIN HUMAN 25 % IV SOLN
INTRAVENOUS | Status: AC
  Administered 2022-01-05: 12.5 g
  Filled 2022-01-05: qty 100

## 2022-01-05 MED ORDER — TRANEXAMIC ACID (OHS) BOLUS VIA INFUSION
15.0000 mg/kg | INTRAVENOUS | Status: AC
  Administered 2022-01-06: 1162.5 mg via INTRAVENOUS
  Filled 2022-01-05: qty 1163

## 2022-01-05 MED ORDER — TRANEXAMIC ACID 1000 MG/10ML IV SOLN
1.5000 mg/kg/h | INTRAVENOUS | Status: AC
  Administered 2022-01-06: 1.5 mg/kg/h via INTRAVENOUS
  Filled 2022-01-05: qty 25

## 2022-01-05 MED ORDER — MIDODRINE HCL 5 MG PO TABS
ORAL_TABLET | ORAL | Status: AC
  Filled 2022-01-05: qty 1

## 2022-01-05 MED ORDER — CHLORHEXIDINE GLUCONATE CLOTH 2 % EX PADS
6.0000 | MEDICATED_PAD | Freq: Once | CUTANEOUS | Status: AC
  Administered 2022-01-05: 6 via TOPICAL

## 2022-01-05 MED ORDER — VANCOMYCIN HCL 1250 MG/250ML IV SOLN
1250.0000 mg | INTRAVENOUS | Status: AC
  Administered 2022-01-06: 1250 mg via INTRAVENOUS
  Filled 2022-01-05: qty 250

## 2022-01-05 MED ORDER — HEPARIN 30,000 UNITS/1000 ML (OHS) CELLSAVER SOLUTION
Status: DC
  Filled 2022-01-05: qty 1000

## 2022-01-05 MED ORDER — PHENYLEPHRINE HCL-NACL 20-0.9 MG/250ML-% IV SOLN
30.0000 ug/min | INTRAVENOUS | Status: AC
  Administered 2022-01-06: 50 ug/min via INTRAVENOUS
  Filled 2022-01-05: qty 250

## 2022-01-05 MED ORDER — TRANEXAMIC ACID (OHS) PUMP PRIME SOLUTION
2.0000 mg/kg | INTRAVENOUS | Status: DC
  Filled 2022-01-05: qty 1.55

## 2022-01-05 MED ORDER — EPINEPHRINE HCL 5 MG/250ML IV SOLN IN NS
0.0000 ug/min | INTRAVENOUS | Status: AC
  Administered 2022-01-06: 2 ug/min via INTRAVENOUS
  Filled 2022-01-05: qty 250

## 2022-01-05 MED ORDER — CHLORHEXIDINE GLUCONATE CLOTH 2 % EX PADS
6.0000 | MEDICATED_PAD | Freq: Once | CUTANEOUS | Status: AC
  Administered 2022-01-06: 6 via TOPICAL

## 2022-01-05 MED ORDER — CEFAZOLIN SODIUM-DEXTROSE 2-4 GM/100ML-% IV SOLN
2.0000 g | INTRAVENOUS | Status: DC
  Filled 2022-01-05: qty 100

## 2022-01-05 MED ORDER — PLASMA-LYTE A IV SOLN
INTRAVENOUS | Status: DC
  Filled 2022-01-05: qty 2.5

## 2022-01-05 MED ORDER — NITROGLYCERIN IN D5W 200-5 MCG/ML-% IV SOLN
2.0000 ug/min | INTRAVENOUS | Status: DC
  Filled 2022-01-05: qty 250

## 2022-01-05 MED ORDER — INSULIN REGULAR(HUMAN) IN NACL 100-0.9 UT/100ML-% IV SOLN
INTRAVENOUS | Status: AC
  Administered 2022-01-06: 2.2 [IU]/h via INTRAVENOUS
  Filled 2022-01-05: qty 100

## 2022-01-05 MED ORDER — CHLORHEXIDINE GLUCONATE 0.12 % MT SOLN
15.0000 mL | Freq: Once | OROMUCOSAL | Status: AC
  Administered 2022-01-06: 15 mL via OROMUCOSAL
  Filled 2022-01-05: qty 15

## 2022-01-05 MED ORDER — MAGNESIUM SULFATE 50 % IJ SOLN
40.0000 meq | INTRAMUSCULAR | Status: DC
  Filled 2022-01-05: qty 9.85

## 2022-01-05 MED ORDER — BISACODYL 5 MG PO TBEC
5.0000 mg | DELAYED_RELEASE_TABLET | Freq: Once | ORAL | Status: AC
  Administered 2022-01-05: 5 mg via ORAL
  Filled 2022-01-05: qty 1

## 2022-01-05 MED ORDER — NOREPINEPHRINE 4 MG/250ML-% IV SOLN
0.0000 ug/min | INTRAVENOUS | Status: AC
  Administered 2022-01-06: 4 ug/min via INTRAVENOUS
  Filled 2022-01-05: qty 250

## 2022-01-05 MED ORDER — METOPROLOL TARTRATE 12.5 MG HALF TABLET
12.5000 mg | ORAL_TABLET | Freq: Once | ORAL | Status: AC
  Administered 2022-01-06: 12.5 mg via ORAL
  Filled 2022-01-05: qty 1

## 2022-01-05 MED ORDER — CEFAZOLIN SODIUM-DEXTROSE 2-4 GM/100ML-% IV SOLN
2.0000 g | INTRAVENOUS | Status: AC
  Administered 2022-01-06 (×2): 2 g via INTRAVENOUS
  Filled 2022-01-05: qty 100

## 2022-01-05 NOTE — Progress Notes (Signed)
CARDIAC REHAB PHASE I   Preop ed completed with pt and husband. Pts family states understanding of need for surgery, but voices a lot of concerns. Hopeful for good outcomes. Provided support and encouragement. Will continue to follow pt throughout their hospital stay.  [id number] Rufina Falco, RN BSN 01/05/2022 11:41 AM

## 2022-01-05 NOTE — Progress Notes (Signed)
Pre cabg has been completed.   Preliminary results in CV Proc.   Jody Nguyen 01/05/2022 9:37 AM

## 2022-01-05 NOTE — Care Management Important Message (Signed)
Important Message  Patient Details  Name: Jody Nguyen MRN: [id number] Date of Birth: 12/17/1955   Medicare Important Message Given:  Yes     Shelda Altes 01/05/2022, 9:37 AM

## 2022-01-05 NOTE — Progress Notes (Signed)
AVF duplex completed. Refer to "CV Proc" under chart review to view preliminary results.  01/05/2022 3:07 PM Kelby Aline., MHA, RVT, RDCS, RDMS

## 2022-01-05 NOTE — Progress Notes (Signed)
Progress Note   Patient: Jody Nguyen KGY:[id number] DOB: [birth year] DOA: 01/09/2022     4 DOS: the patient was seen and examined on 01/05/2022   Brief hospital course: Jody Nguyen is a 66 year old female with past medical history significant for pulmonary hypertension followed by cardiology at [facility] health, chronic diastolic congestive heart failure, previous MV repair, nonobstructive CAD per cath done in 2016, hypertension, hyperlipidemia, ESRD on HD MWF, CVA, type 2 diabetes, history of PE on Eliquis who presented to [facility] ED on 2/4 with complaint of persistent chest pain. Patient states she is having episodes of chest pain this past week.  Reports nonexertional pain all across her chest with radiation to the back and throbbing in nature.  Each episode lasts about 10 minutes.  She has had episodes of chest pain after dialysis but having it even on nondialysis days.  Associated with nausea and dyspnea.  Denies history of blood clots. Last dialysis session was on Friday and she still makes urine.  She was seen in the ED on 12/28/2021 for chest pain and troponins were elevated at 100 but flat and consistent with previous.  She was slightly hypotensive and given fluid bolus.  EKG with no acute changes.  Seen by cardiology on 12/29/2021 and it was felt that her chest pain and troponin elevation were due to sudden fluid volume shift as chest pain occurred during dialysis.  Plan was to monitor her symptoms and discuss settings with HD prior to pursuing invasive cardiac catheterization.  In the ED, WBC 3.8 (slightly low on recent labs as well), hemoglobin 10.7 (stable), platelet count 130k.  Sodium 137, potassium 3.2, chloride 91, bicarb 32, BUN 29, creatinine 7.2, glucose 119.  EKG showing sinus rhythm, new LAFB and RBBB, QTC prolongation. High-sensitivity troponin elevated but stable (210 > 201). COVID and flu negative.  Chest x-ray showing no active disease. While in the ED, patient had an  episode of sinus tachycardia with rate in the 130s to 140s and became hypotensive with systolic in the 02O. 500 cc fluid bolus was given after which blood pressure improved and heart rate normalized.  ED physician discussed the case with cardiology, recommended admission for observation. TRH consulted.  She is found to have severe coronary calcification and multivessel CAD.  Recommendation was to obtain a surgical consultation for consideration for CABG and cardiothoracic surgery feels that she is high risk but she would likely benefit from a CABG.  Plan is for surgical intervention on Friday, 2/10/202.  Assessment and Plan: * Chest pain, Multivessel CAD- (present on admission) -Patient represented to the ED with persistent chest pain.   -Seen 4 days prior with noted troponins elevated at 100 but flat and consistent with previous.  -Cardiology at that time felt her symptoms were related to set fluid volume shifts during dialysis; however, she has continued to experience chest pain even on nondialysis days associated with dyspnea.  -EKG with new LAFB and RBBB.  -Cardiac catheterization done in 2016 showing nonobstructive CAD.   -Stress test done in October 2022 was intermediate risk.  - Cardiology was consulted by ED who recommended admission for observation.   -Patient did develop transient episode of tachycardia, hypotension and respiratory failure that resolved with IV fluid bolus and she was titrated off of oxygen.   -Nuclear medicine VQ scan with low probability pulmonary embolism.   -TTE with LVEF 40-45%, LV mildly decreased function, no regional wall motion abnormalities, moderate LVH, RVSP 39.6, LA mildly  dilated, noted MV repair, severe TR, moderate TV stenosis.  -Underwent left heart catheterization 01/02/2022 with severe coronary calcification and multivessel CAD with LAD 99%, first diagonal 90%, ostial circumflex to proximal circumflex 100%, proximal RCA 100% stenosis. -Cardiology following,  appreciate assistance -High-sensitivity troponin elevated 210 > 201 >194 > 189 > 207   -C/w Heparin drip (Eliquis outpatient) -Continue to Monitor on telemetry -Cardiology to consult CTS for CABG evaluation and Dr. Roxan Hockey evaluated and feels the patient would benefit from coronary for surgical revascularization this will be considered high risk due to her multiple medical comorbidities as well as previous events of past surgical intervention related to her mitral valve repair; CABG planned for AM  GERD (gastroesophageal reflux disease) /GI Prophylaxis -C/w Pantoprazole 40 mg po Daily PRN GERD  Hyponatremia -Patient's Na+ went from 135 -> 133 -> 134 -Continue to Monitor and Trend and expect to be corrected in Dialysis -Repeat CMP in the AM   Coronary artery disease involving native coronary artery of native heart with unstable angina pectoris ([place])- (present on admission) -Left heart catheterization 01/17/2022 with severe coronary calcification and multivessel CAD with LAD 99%, first diagonal 90%, ostial circumflex to proximal circumflex 100%, proximal RCA 100% stenosed. -Cardiology to consult CTS for possible CABG; Patient is a candidate but High Riskc -CABG is scheduled for Friday 01/20/2022 and in the AM -Continue Aspirin 81 mg po daily, Rousvastatin 20 mg po qHS, Metoprol Tartrate 12.5 mg po BID  Heparin drip  SVT (supraventricular tachycardia) ([place]) -Patient now with 2 episodes of SVT during hospitalization, improved with Valsalva maneuvers. -Cardiology following, appreciate assistance -Discontinued carvedilol, starting metoprolol tartrate 25 mg p.o. twice daily -Continue to monitor on telemetry  QT prolongation- (present on admission) -Initial EKG with QTC 556 and repeat was 517 ms -Avoid QT prolonging medications -Continue to Monitor on telemetry  History of stroke -C/w Aspirin 81 mg p.o. daily and Rosuvastatin 20 mg p.o. nightly -Will need PT/OT to evaluate and Treat once  improved  End stage renal disease on dialysis [facility]) Follows with nephrology outpatient, [name].  -Dialyzes at [facility] on a MWF schedule.   -Continue HD per nephrology while hospitalized -C/w Cinacalcet 90 mg po TID -C/w Darbepoetin Alfa 100 mcg IV q Wed -C/w Docercalcierol 9 mcg IV qMWF -Patient's BUN/Cr went from 49/110.38 -> 48/10.25 -> 25/7.25 -Avoid nephrotoxic medications, contrast dyes, hypotension and Renally adjust medication -Continue to Monitor and Trend Renal Fxn Panel and repeat CMP in the AM -Patient had swelling at her Fistula site so U/S was ordered and showed "Arteriovenous fistula-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 No to-fro flow identified to suggest pseudoaneurysm." -Patient has an elevated anion gap of 17 -VVS to evaluate for Access given that patient is getting CABG in the AM   S/P minimally invasive mitral valve repair -Has had a previous mitral valve repair and cardiology and cardiothoracic surgery is following  Chronic combined systolic and diastolic heart failure ([place])- (present on admission) -Stable.   -Currently No signs of volume overload.  - TTE with LVEF now 40-45%, LV mildly decreased function, no regional wall motion normalities, moderate LVH, RVSP 39.6, LA moderately dilated, MV repair noted, severe TR, moderate TV stenosis. -C/w HD for volume management -Strict I's and O's and daily weights; She is +23.9 mL but ? accuracy -Cardiology is following and so is Nephro for Volume Management.   Pulmonary hypertension ([place])- (present on admission) -C/w Macitentan 10mg  PO daily -C/w Sildenafil 40mg  PO TID -Continue to Monitor Respiratory  Status carefully  Obesity (BMI 24-09.7) -Complicates overall prognosis and care -Estimated body mass index is 35.55 kg/m as calculated from the following:   Height as of this encounter: 4' 11.5" (1.511 m).   Weight as of this encounter: 81.2 kg.  -Weight Loss and Dietary Counseling given and  discussed with patient needs for aggressive lifestyle changes/weight loss as this complicates all facets of care.   -Will need Outpatient follow-up with PCP.    Type 2 diabetes mellitus ([place]) -Diet controlled at home. -C/w Very Sensitive Novolog SSI AC/HS for coverage -CBGs qAC/HS -CBG's ranging from 101-163  Anemia of chronic renal failure -Patient's Hgb/Hct went from 10.4/31.7 -> 9.7/29.9 -> 9.8/30.7 -Checked Anemia Panel and showed iron level of 69, U IBC of 207, TIBC 276, saturation ratios of 25%, ferritin level 1300, folate level 7.4, vitamin B12 level 1498 -Continue to Monitor for S/Sx of Bleeding as patient is on a Heparin gtt; No overt bleeding noted -Repeat CBC in the AM   Hyperlipidemia associated with type 2 diabetes mellitus ([place])- (present on admission) -C/w Rousvastatin 20 mg p.o. nightly  Essential hypertension- (present on admission) She had an episode of hypotension in the ED which resolved after a 500 cc fluid bolus.   -Blood pressure currently stable.   -Currently On carvedilol 12.5 mg p.o. twice daily, losartan 50 mg p.o. daily, Hydralazine 50mg  PO TID at home. -Was holding home antihypertensives currently due to transient hypotension on admission but has been started on Metoprolol Tartrate 12.5 mg po BID  -C/wHydralazine 25mg  PO q8h PRN SBP >180 or SBP >110 -Continue monitor BP closely and per protocol -Last BP reading was 91/62   Subjective: Seen and examined at bedside and she was resting in the bed.  Had just received some anxiolytics given that she was fidgety this morning.  Husband thinks that her fistula is a little bit bigger and her arms were more swollen.  No lightheadedness or dizziness and she is not complaining of any pain.  Physical Exam: Vitals:   01/05/22 1000 01/05/22 1024 01/05/22 1124 01/05/22 1518  BP:  96/60 91/62   Pulse: 90  89   Resp: 19  18 20   Temp:   98.1 F (36.7 C) 98.7 F (37.1 C)  TempSrc:    Oral  SpO2: 97%  93%   Weight:       Height:       Examination: Physical Exam:  Constitutional: WN/WD obese African-American female currently in no acute distress appears calm and resting after she received anxiolytics Respiratory: Mildly diminished to auscultation bilaterally, no wheezing, rales, rhonchi or crackles. Normal respiratory effort and patient is not tachypenic. No accessory muscle use.  Unlabored breathing Cardiovascular: RRR, no murmurs / rubs / gallops. S1 and S2 auscultated.  Slight extremity edema Abdomen: Soft, non-tender, distended secondary body habitus Bowel sounds positive.  GU: Deferred. Skin: AV fistula is slightly larger than yesterday   Data Reviewed:  Reviewed the patient's CMP, anemia panel and CBC and her hemoglobin is relatively stable and she did have a slight hyponatremia.  Patient's BUN/creatinine is now improved after dialysis  Family Communication: Discussed with the husband at bedside  Disposition: Status is: Inpatient Remains inpatient appropriate because: will Be going for CABG in the morning  Planned Discharge Destination: Home with Home Health  DVT Prophylaxis: Heparin gtt  Author: Raiford Noble, DO [facility] 01/05/2022 4:52 PM  For on call review [email or web address].

## 2022-01-05 NOTE — Anesthesia Preprocedure Evaluation (Addendum)
Anesthesia Evaluation  Patient identified by MRN, date of birth, ID band Patient awake    Reviewed: Allergy & Precautions, NPO status , Patient's Chart, lab work & pertinent test results, reviewed documented beta blocker date and time   Airway Mallampati: III  TM Distance: >3 FB Neck ROM: Full    Dental  (+) Teeth Intact, Dental Advisory Given   Pulmonary neg pulmonary ROS,    Pulmonary exam normal breath sounds clear to auscultation       Cardiovascular hypertension, Pt. on home beta blockers and Pt. on medications + angina + CAD and +CHF  Normal cardiovascular exam+ Valvular Problems/Murmurs (TR, S/P minimally invasive mitral valve repair)  Rhythm:Regular Rate:Normal     Neuro/Psych PSYCHIATRIC DISORDERS Depression CVA, No Residual Symptoms    GI/Hepatic Neg liver ROS, GERD  Medicated,  Endo/Other  diabetesObesity   Renal/GU Renal Insufficiency, ESRF and DialysisRenal disease (MWF)     Musculoskeletal negative musculoskeletal ROS (+)   Abdominal   Peds  Hematology  (+) Blood dyscrasia (Eliquis), ,   Anesthesia Other Findings   Reproductive/Obstetrics                            Anesthesia Physical Anesthesia Plan  ASA: 4  Anesthesia Plan: General   Post-op Pain Management:    Induction: Intravenous  PONV Risk Score and Plan: 3 and Treatment may vary due to age or medical condition  Airway Management Planned: Oral ETT  Additional Equipment: Arterial line, CVP, PA Cath, TEE and Ultrasound Guidance Line Placement  Intra-op Plan:   Post-operative Plan: Post-operative intubation/ventilation  Informed Consent: I have reviewed the patients History and Physical, chart, labs and discussed the procedure including the risks, benefits and alternatives for the proposed anesthesia with the patient or authorized representative who has indicated his/her understanding and acceptance.      Dental advisory given  Plan Discussed with:   Anesthesia Plan Comments:        Anesthesia Quick Evaluation

## 2022-01-05 NOTE — Progress Notes (Signed)
Progress Note  Patient Name: Jody Nguyen Date of Encounter: 01/05/2022  [facility] Cardiologist: [name], MD   Subjective   No CP or dyspnea  Inpatient Medications    Scheduled Meds:  aspirin  81 mg Oral Daily   bisacodyl  5 mg Oral Once   [START ON 01/18/2022] chlorhexidine  15 mL Mouth/Throat Once   Chlorhexidine Gluconate Cloth  6 each Topical Once   And   [START ON 12/30/2021] Chlorhexidine Gluconate Cloth  6 each Topical Once   cinacalcet  90 mg Oral Once per day on Mon Wed Fri   darbepoetin (ARANESP) injection - DIALYSIS  100 mcg Intravenous Q Wed-HD   [START ON 01/19/2022] diazepam  2 mg Oral Once   doxercalciferol  9 mcg Intravenous Q M,W,F-HD   insulin aspart  0-5 Units Subcutaneous QHS   insulin aspart  0-6 Units Subcutaneous TID WC   latanoprost  1 drop Both Eyes QHS   macitentan  10 mg Oral Daily   metoprolol tartrate  12.5 mg Oral BID   [START ON 01/13/2022] metoprolol tartrate  12.5 mg Oral Once   rosuvastatin  20 mg Oral QHS   sildenafil  40 mg Oral TID   sodium chloride flush  3 mL Intravenous Q12H   sodium chloride flush  3 mL Intravenous Q12H   Continuous Infusions:  sodium chloride     heparin 1,250 Units/hr (01/04/22 1557)   PRN Meds: sodium chloride, acetaminophen **OR** acetaminophen, acetaminophen, diazepam, hydrALAZINE, pantoprazole, sodium chloride flush   Vital Signs    Vitals:   01/05/22 0216 01/05/22 0612 01/05/22 0613 01/05/22 0725  BP: (!) 92/57 (!) 81/60  (!) 91/59  Pulse: 87 84 87 86  Resp: (!) 28 (!) 23 16   Temp: 99 F (37.2 C) 98.1 F (36.7 C)  98 F (36.7 C)  TempSrc: Oral Oral  Oral  SpO2: 98% 94% 98% (!) 88%  Weight:  77.5 kg    Height:        Intake/Output Summary (Last 24 hours) at 01/05/2022 0746 Last data filed at 01/04/2022 1058 Gross per 24 hour  Intake --  Output 2100 ml  Net -2100 ml    Last 3 Weights 01/05/2022 01/10/2022 01/11/2022  Weight (lbs) 170 lb 12.8 oz 179 lb 0.2 oz 179 lb 0.2 oz  Weight  (kg) 77.474 kg 81.2 kg 81.2 kg      Telemetry    Sinus with occasional PVC - Personally Reviewed  Physical Exam   GEN: NAD Neck: supple, no JVD Cardiac: RRR, no rub Respiratory: CTA; no wheeze GI: Soft, NT/ND MS: No edema Neuro:  No focal findings Psych: Normal affect   Labs    High Sensitivity Troponin:   Recent Labs  Lab 01/21/2022 1853 01/02/2022 2052 01/01/22 0625 01/01/22 1211 01/01/22 1402  TROPONINIHS 210* 201* 194* 189* 207*      Chemistry Recent Labs  Lab 01/01/22 0625 01/01/22 1402 01/01/22 1402 01/02/22 0704 01/04/2022 0415 01/04/22 0545 01/05/22 0421  NA 137  --   --  134* 135 133* 134*  K 3.8  --   --  3.7 3.9 3.8 3.8  CL 95*  --   --  92* 94* 92* 94*  CO2 24  --   --  24 24 23 23   GLUCOSE 112*  --   --  109* 128* 94 127*  BUN 30*  --   --  38* 49* 48* 25*  CREATININE 7.94*  --   --  9.15* 10.38* 10.25* 7.20*  CALCIUM 8.8*  --   --  8.6* 7.5* 8.2* 8.1*  MG 2.3  --   --   --   --   --  2.1  PROT  --  7.7  --   --   --   --  7.0  ALBUMIN  --  3.8   < > 3.4*  --  3.3* 3.4*  AST  --  23  --   --   --   --  15  ALT  --  24  --   --   --   --  15  ALKPHOS  --  128*  --   --   --   --  90  BILITOT  --  0.8  --   --   --   --  0.7  GFRNONAA 5*  --   --  4* 4* 4* 6*  ANIONGAP 18*  --   --  18* 17* 18* 17*   < > = values in this interval not displayed.      Hematology Recent Labs  Lab 12/28/2021 0415 01/04/22 0545 01/05/22 0421  WBC 3.9* 3.8* 4.7  RBC 3.74* 3.54* 3.55*   3.52*  HGB 10.4* 9.7* 9.8*  HCT 31.7* 29.9* 30.7*  MCV 84.8 84.5 86.5  MCH 27.8 27.4 27.6  MCHC 32.8 32.4 31.9  RDW 21.2* 20.9* 21.3*  PLT 90* 97* 94*    BNP Recent Labs  Lab 01/01/22 0941  BNP 1,432.4*     DDimer  Recent Labs  Lab 01/01/22 0940  DDIMER 1.22*      Radiology    CARDIAC CATHETERIZATION  Result Date: 01/18/2022   Prox LAD lesion is 99% stenosed.   1st Diag lesion is 90% stenosed.   Ost Cx to Prox Cx lesion is 100% stenosed.   Prox RCA lesion is  100% stenosed. Severe coronary calcification and multivessel CAD. The LAD is 99% noticed after giving rise to a optional diagonal like vessel that has 90% ostial stenosis.  The mid LAD has 30 and 70% mid distal stenoses. The left circumflex vessel was totally occluded at the ostium.  There is septal to the distal circumflex collateralization with filling of the vessel up to the proximal AV groove. The right coronary artery is totally occluded proximally.  There is bridging collaterals filling the RCA from the RV marginal branch downward. LVEDP 23 mm. RECOMMENDATION: We will obtain surgical consultation for consideration of CABG revascularization.  The patient has good targets.  Reportedly, the patient had no ischemia on a prior nuclear study and a lot of anatomical findings, question if there was balanced ischemia at the time of her prior study.  Will resume heparin 8 hours post procedure.   Patient Profile     66 year old female with past medical history of previous mitral valve repair, pulmonary hypertension, nonobstructive coronary disease, chronic diastolic congestive heart failure, hypertension, hyperlipidemia, end-stage renal disease for evaluation of chest pain and SVT.  Patient had a nuclear study in October that showed no ischemia.  Echocardiogram October 2022 showed ejection fraction 50 to 55%, mild RVE/RV dysfunction, previous mitral valve repair with mean gradient 9 mmHg suggestive of moderate mitral stenosis, mild mitral regurgitation and moderate tricuspid regurgitation.  Right heart catheterization at [facility] October 2022 showed PA pressure 50/22 with pulmonary capillary wedge pressure 18.  Patient is complaining of intermittent chest pain.  It is substernal radiating to her back.  It is not exertional.  Typically last approximately 10 minutes.  It is associated with heart racing.  She otherwise has some fatigue and dyspnea on exertion but no orthopnea, PND or pedal edema.  She has been admitted  and cardiology asked to evaluate.  Assessment & Plan    1 unstable angina-patient remains pain-patient remains pain-free.  As previously outlined she has severe three-vessel coronary disease and coronary artery bypass and graft is planned for Friday morning.  Continue aspirin, heparin, metoprolol and Crestor.     2 SVT-no recurrences.  However blood pressure has been intermittently low.  Discontinue metoprolol and follow.   3 pulmonary hypertension-this is longstanding.  We will continue home dose of revatio and opsumit.  Patient is followed at [facility].  Note patient had right heart catheterization October 2022 which showed PA pressure 50/23, pulmonary capillary wedge pressure 25, PVR 1.9.   4 history of pulmonary embolus-resume apixaban prior to discharge.   5 history of mitral valve repair-follow-up echocardiogram shows no mitral regurgitation and moderate mitral stenosis.  There is note of tricuspid regurgitation and may need ring at time of coronary artery bypass graft.   6 end-stage renal disease-dialysis per nephrology.  For questions or updates, please contact [place] Please consult [email or web address] for contact info under        Signed, [name], MD  01/05/2022, 7:46 AM

## 2022-01-05 NOTE — Progress Notes (Signed)
LMOM FOR PATIENT TO CALL BACK FOR LAB RESULTS. Jody Nguyen [facility]

## 2022-01-05 NOTE — Progress Notes (Addendum)
[facility] Progress Note   Subjective:   Patient seen and examined at bedside.  Reports feeling bad this morning, does not give specifics.  Denies CP, SOB, abdominal pain and n/v/d.  States she feels weak and tired.  Nurse reports she gave her a valium prior to going to a procedure this AM. While in the room, O2 sat fluctuating from 82-95% while she is sleeping, placed back on 2L via [place] and maintained >94%.   Objective Vitals:   01/05/22 0900 01/05/22 1000 01/05/22 1024 01/05/22 1124  BP:   96/60 91/62  Pulse: 89 90  89  Resp: (!) 23 19  18   Temp:      TempSrc:      SpO2: 93% 97%  93%  Weight:      Height:       Physical Exam General:ill appearing female in NAD Heart:RRR, no mrg Lungs:mostly CTAB, nml WOB on RA Abdomen:soft, NTND Extremities:RUE +edema, +tenderness, no LE edema Dialysis Access: RU AVF +bruit  Filed Weights   01/02/2022 1329 01/02/2022 1434 01/05/22 0612  Weight: 81.2 kg 81.2 kg 77.5 kg    Intake/Output Summary (Last 24 hours) at 01/05/2022 1233 Last data filed at 01/05/2022 0900 Gross per 24 hour  Intake 240 ml  Output --  Net 240 ml    Additional Objective Labs: Basic Metabolic Panel: Recent Labs  Lab 01/02/22 0704 01/23/2022 0415 01/04/22 0545 01/05/22 0421  NA 134* 135 133* 134*  K 3.7 3.9 3.8 3.8  CL 92* 94* 92* 94*  CO2 24 24 23 23   GLUCOSE 109* 128* 94 127*  BUN 38* 49* 48* 25*  CREATININE 9.15* 10.38* 10.25* 7.20*  CALCIUM 8.6* 7.5* 8.2* 8.1*  PHOS 7.5*  --  7.0* 4.3   Liver Function Tests: Recent Labs  Lab 01/01/22 1402 01/02/22 0704 01/04/22 0545 01/05/22 0421  AST 23  --   --  15  ALT 24  --   --  15  ALKPHOS 128*  --   --  90  BILITOT 0.8  --   --  0.7  PROT 7.7  --   --  7.0  ALBUMIN 3.8 3.4* 3.3* 3.4*    CBC: Recent Labs  Lab 01/02/2022 1853 01/02/22 0704 01/16/2022 0415 01/04/22 0545 01/05/22 0421  WBC 3.8* 4.4 3.9* 3.8* 4.7  NEUTROABS  --   --   --   --  2.7  HGB 10.7* 10.8* 10.4* 9.7* 9.8*  HCT  34.9* 32.7* 31.7* 29.9* 30.7*  MCV 87.0 84.9 84.8 84.5 86.5  PLT 130* 118* 90* 97* 94*   Blood Culture    Component Value Date/Time   SDES  12/28/2021 1101    BLOOD LEFT ARM Performed at [facility], [address], [place], [place] [address]    [facility]  12/28/2021 1101    BOTTLES DRAWN AEROBIC AND ANAEROBIC Blood Culture adequate volume Performed at [facility], [address], [place], [place] [address]    CULT  12/28/2021 1101    NO GROWTH 5 DAYS Performed at [facility], [place] [address], [place], [place] [address]    REPTSTATUS 01/02/2022 FINAL 12/28/2021 1101    CBG: Recent Labs  Lab 01/04/22 1132 01/04/22 1649 01/04/22 2051 01/05/22 0807 01/05/22 1114  GLUCAP 80 125* 107* 131* 163*   Iron Studies:  Recent Labs    01/05/22 0421  IRON 69  TIBC 276  FERRITIN 1,300*   Lab Results  Component Value Date   INR  1.1 08/28/2021   INR 1.05 04/28/2017   INR 1.04 04/27/2017   Studies/Results: VAS US DOPPLER PRE CABG  Result Date: 01/05/2022 PREOPERATIVE VASCULAR EVALUATION Patient Name:  CHIKA CICHOWSKI  Date of Exam:   01/05/2022 Medical Rec #: [id number]         Accession #:    [id number] Date of Birth: December 24, 1955          Patient Gender: F Patient Age:   66 years Exam Location:  [facility] Procedure:      VAS US DOPPLER PRE CABG Referring Phys: Remo Lipps HENDRICKSON --------------------------------------------------------------------------------  Indications:      Pre-CABG. Risk Factors:     Hypertension, hyperlipidemia, Diabetes, prior CVA. Limitations:      patient movement Comparison Study: no prior Performing Technologist: Archie Patten RVS  Examination Guidelines: A complete evaluation includes B-mode imaging, spectral Doppler, color Doppler, and power Doppler as needed of all accessible portions of each vessel. Bilateral testing is considered an integral part of a complete examination. Limited examinations for  reoccurring indications may be performed as noted.  Right Carotid Findings: +----------+--------+--------+--------+------------+--------+             PSV cm/s EDV cm/s Stenosis Describe     Comments  +----------+--------+--------+--------+------------+--------+  CCA Prox   53       11                heterogenous           +----------+--------+--------+--------+------------+--------+  CCA Distal 60       14                heterogenous           +----------+--------+--------+--------+------------+--------+  ICA Prox   50       18       1-39%    heterogenous           +----------+--------+--------+--------+------------+--------+  ICA Distal 45       15                                       +----------+--------+--------+--------+------------+--------+  ECA        46                                                +----------+--------+--------+--------+------------+--------+ +----------+--------+-------+--------+------------+             PSV cm/s EDV cms Describe Arm Pressure  +----------+--------+-------+--------+------------+  Subclavian 101      22                             +----------+--------+-------+--------+------------+ +---------+--------+--+--------+-+---------+  Vertebral PSV cm/s 30 EDV cm/s 8 Antegrade  +---------+--------+--+--------+-+---------+ Left Carotid Findings: +----------+--------+--------+--------+------------+--------+             PSV cm/s EDV cm/s Stenosis Describe     Comments  +----------+--------+--------+--------+------------+--------+  CCA Prox   103      19                heterogenous           +----------+--------+--------+--------+------------+--------+  CCA Distal 67       18  heterogenous           +----------+--------+--------+--------+------------+--------+  ICA Prox   93       30       1-39%    heterogenous           +----------+--------+--------+--------+------------+--------+  ICA Distal 60       22                                        +----------+--------+--------+--------+------------+--------+  ECA        34                                                +----------+--------+--------+--------+------------+--------+ +----------+--------+--------+--------+------------+  Subclavian PSV cm/s EDV cm/s Describe Arm Pressure  +----------+--------+--------+--------+------------+             45                                       +----------+--------+--------+--------+------------+ +---------+--------+--+--------+-+---------+  Vertebral PSV cm/s 34 EDV cm/s 9 Antegrade  +---------+--------+--+--------+-+---------+  ABI Findings: +---------+------------------+-----+---------+---------------------------------+  Right     Rt Pressure (mmHg) Index Waveform  Comment                            +---------+------------------+-----+---------+---------------------------------+  Brachial                           triphasic fistula                            +---------+------------------+-----+---------+---------------------------------+  PTA       113                1.20  biphasic                                     +---------+------------------+-----+---------+---------------------------------+  DP        106                1.13  triphasic                                    +---------+------------------+-----+---------+---------------------------------+  Great Toe                                    unable to obtain waveform or                                                     pressure due to paient constant  movement                           +---------+------------------+-----+---------+---------------------------------+ +---------+------------------+-----+---------+---------------------------------+  Left      Lt Pressure (mmHg) Index Waveform  Comment                            +---------+------------------+-----+---------+---------------------------------+  Brachial  94                       triphasic                                     +---------+------------------+-----+---------+---------------------------------+  PTA                                          unable to obtain waveform or                                                     pressure due to paient constant                                                  movement                           +---------+------------------+-----+---------+---------------------------------+  DP        121                1.29  triphasic                                    +---------+------------------+-----+---------+---------------------------------+  Great Toe                                    unable to obtain waveform or                                                     pressure due to paient constant                                                  movement                           +---------+------------------+-----+---------+---------------------------------+ +-------+---------------+----------------+  ABI/TBI Today's ABI/TBI Previous ABI/TBI  +-------+---------------+----------------+  Right   1.20                              +-------+---------------+----------------+  Left    1.29                              +-------+---------------+----------------+  Right Doppler Findings: +--------+--------+-----+---------+--------+  Site     Pressure Index Doppler   Comments  +--------+--------+-----+---------+--------+  Brachial                triphasic fistula   +--------+--------+-----+---------+--------+  Radial                            fistula   +--------+--------+-----+---------+--------+  Ulnar                             fistula   +--------+--------+-----+---------+--------+  Left Doppler Findings: +--------+--------+-----+---------+--------+  Site     Pressure Index Doppler   Comments  +--------+--------+-----+---------+--------+  Brachial 94             triphasic           +--------+--------+-----+---------+--------+  Radial                  triphasic            +--------+--------+-----+---------+--------+  Ulnar                   triphasic           +--------+--------+-----+---------+--------+  Summary: Right Carotid: Velocities in the right ICA are consistent with a 1-39% stenosis. Left Carotid: Velocities in the left ICA are consistent with a 1-39% stenosis. Vertebrals: Bilateral vertebral arteries demonstrate antegrade flow. Right ABI: Resting right ankle-brachial index is within normal range. No evidence of significant right lower extremity arterial disease. Left ABI: Resting left ankle-brachial index is within normal range. No evidence of significant left lower extremity arterial disease. Right Upper Extremity: Unable to obtain allens test due to fistula. Left Upper Extremity: Doppler waveforms remain within normal limits with left radial compression. Doppler waveforms remain within normal limits with left ulnar compression.     Preliminary     Medications:  sodium chloride     [START ON 01/19/2022]  ceFAZolin (ANCEF) IV     [START ON 12/29/2021]  ceFAZolin (ANCEF) IV     [START ON 01/13/2022] dexmedetomidine     [START ON 12/30/2021] heparin 30,000 units/NS 1000 mL solution for CELLSAVER     heparin 1,250 Units/hr (01/04/22 1557)   [START ON 01/10/2022] milrinone     [START ON 01/01/2022] nitroGLYCERIN     [START ON 01/19/2022] norepinephrine     [START ON 01/18/2022] tranexamic acid (CYKLOKAPRON) infusion ([facility])     [START ON 01/08/2022] vancomycin      aspirin  81 mg Oral Daily   [START ON 01/01/2022] chlorhexidine  15 mL Mouth/Throat Once   Chlorhexidine Gluconate Cloth  6 each Topical Once   And   [START ON 01/15/2022] Chlorhexidine Gluconate Cloth  6 each Topical Once   cinacalcet  90 mg Oral Once per day on Mon Wed Fri   darbepoetin (ARANESP) injection - DIALYSIS  100 mcg Intravenous Q Wed-HD   [START ON 01/24/2022] diazepam  2 mg Oral Once   doxercalciferol  9 mcg Intravenous Q M,W,F-HD   [START ON 12/28/2021] epinephrine  0-10 mcg/min Intravenous To  OR   [START ON 12/30/2021] heparin-papaverine-plasmalyte irrigation   Irrigation To OR   insulin aspart  0-5 Units Subcutaneous QHS  insulin aspart  0-6 Units Subcutaneous TID WC   [START ON 01/20/2022] insulin   Intravenous To OR   latanoprost  1 drop Both Eyes QHS   macitentan  10 mg Oral Daily   [START ON 01/21/2022] magnesium sulfate  40 mEq Other To OR   metoprolol tartrate  12.5 mg Oral BID   [START ON 01/18/2022] metoprolol tartrate  12.5 mg Oral Once   [START ON 01/10/2022] phenylephrine  30-200 mcg/min Intravenous To OR   [START ON 12/30/2021] potassium chloride  80 mEq Other To OR   rosuvastatin  20 mg Oral QHS   sildenafil  40 mg Oral TID   sodium chloride flush  3 mL Intravenous Q12H   sodium chloride flush  3 mL Intravenous Q12H   [START ON 01/09/2022] tranexamic acid  15 mg/kg Intravenous To OR   [START ON 01/10/2022] tranexamic acid  2 mg/kg Intracatheter To OR    Dialysis Orders: MWF at [facility] 3:15hr, 450/A1.5, EDW 75.5kg, 2K/2Ca, UFP #2, AVF, heparin 2500 unit bolus - Hectoral 35mcg IV q HD - Sensipar 90mg  PO q HD - Mircera 163mcg IV q 2 weeks (ordered, not yet given. Last Mircera 100 on 12/07/21)   Assessment/Plan:  Intermittent chest pain with radiation to back/dyspnea: Trops elevated, but trend is stable. No PE on VQ scan.  On heparin drip. Episodes of SVT, changed to metoprolol per cardiology and may require ablation if symptoms not controlled.  LHC with severe multivessel disease.  Plan for CABG tomorrow per CTS.  per PMD/cardiology.  ESRD:  on MWF.  Plan for HD today to optimize patient prior to CABG surgery tomorrow. Will reassess over weekend to see if  Infiltrated AVF - AVF infiltrated on Tuesday when patient moved her arm to move the TV, largely swollen and nurse unable to re cannulate.  Expert cannulator able to stick yesterday and completed full treatment.  Non tender and swollen yesterday. Today swelling increasing throughout the day and tenderness.  [place] of AVF  ordered.  Continue to monitor.    Hypertension/volume: BP remains soft.  Cardiology changed carvedilol to metoprolol and decreased dose to 12.5mg  BID, may need further reduction.  Does not appear volume overloaded but continues to require O2.  Plan for HD today with UF 2-2.5L.  Anemia: Hgb 9.7.  Aranesp not given with HD yesterday, will order SQ today and continue weekly.   Metabolic bone disease: Ca ok, Phos elevated. Continue home binders, VDRA, sensipar. Nutrition - renal diet w/fluid restrictions.   Pulm HTN:  Followed at [facility], on Revatio and opsumit  T2DM CAD/MVR 2016 - severe multivessel disease and severe TR noted on LHC.  Plan for CABG tomorrow.  Jen Mow, PA-C [facility] 01/05/2022,12:33 PM  LOS: 4 days

## 2022-01-05 NOTE — Progress Notes (Signed)
ANTICOAGULATION CONSULT NOTE - Follow Up Consult  Pharmacy Consult for heparin  Indication: hx PE  Allergies  Allergen Reactions   Minoxidil Other (See Comments)    Pericardial effusion   Lipitor [Atorvastatin] Other (See Comments)    MYALGIAS > "pain in legs" Tolerates rosuvastatin   Morphine And Related Itching   Ace Inhibitors Other (See Comments)    REACTION: "not sure...think it made me drowsy all the time"    Patient Measurements: Height: 4' 11.5" (151.1 cm) Weight: 77.5 kg (170 lb 12.8 oz) IBW/kg (Calculated) : 44.35 Heparin Dosing Weight: 61.9 kg  Vital Signs: Temp: 98 F (36.7 C) (02/09 0725) Temp Source: Oral (02/09 0725) BP: 91/62 (02/09 1124) Pulse Rate: 89 (02/09 1124)  Labs: Recent Labs    01/11/2022 0415 01/04/22 0545 01/05/22 0421  HGB 10.4* 9.7* 9.8*  HCT 31.7* 29.9* 30.7*  PLT 90* 97* 94*  HEPARINUNFRC 0.54 0.29* 0.48  CREATININE 10.38* 10.25* 7.20*     Estimated Creatinine Clearance: 7 mL/min (A) (by C-G formula based on SCr of 7.2 mg/dL (H)).  Assessment: 36 yoF admitted with CP and intermittent SOB. Pt on apixaban for hx PE, transitioned to IV heparin for ischemic and PE workup. VQ scan low probability PE 2/6, LHC with mvCAD 2/7 and plans for CABG 2/10  Heparin level at goal on 1250 units/hr  Goal of Therapy:  Heparin level 0.3-0.7 units/ml Monitor platelets by anticoagulation protocol: Yes   Plan:  -Continue heparin at 1250 units/hr -CABG in am  Hildred Laser, PharmD Clinical Pharmacist **Pharmacist phone directory can now be found on [email or web address] ([email or web address]).  Listed under [place].

## 2022-01-05 NOTE — Consult Note (Addendum)
[facility] NOTE   MRN : [id number]  Reason for Consult: AVF infiltrated on Tuesday 01/18/2022 Referring Physician: Dr. Roxan Hockey  History of Present Illness: 66 y/o female Admitted with acute chest pain.  CT surgery plans for CABG 01/22/2022.  She has a right forearm fistula that working for many years since 2018 placed by DR. Donzetta Matters.  She states that on 01/05/2022 her fistula was infiltrated while on HD. HD was held.  She returned for HD 01/04/22 and they are planning for HD tonight.    We were asked by Dr. Leonarda Salon team to evaluate her for possible temp TDC needs.       Current Facility-Administered Medications  Medication Dose Route Frequency Provider Last Rate Last Admin   0.9 %  sodium chloride infusion  250 mL Intravenous PRN Troy Sine, MD       acetaminophen (TYLENOL) tablet 650 mg  650 mg Oral Q6H PRN Shela Leff, MD   650 mg at 01/02/22 1350   Or   acetaminophen (TYLENOL) suppository 650 mg  650 mg Rectal Q6H PRN Shela Leff, MD       acetaminophen (TYLENOL) tablet 650 mg  650 mg Oral Q4H PRN Troy Sine, MD       aspirin chewable tablet 81 mg  81 mg Oral Daily Troy Sine, MD   81 mg at 01/05/22 1024   [START ON 01/15/2022] ceFAZolin (ANCEF) IVPB 2g/100 mL premix  2 g Intravenous To OR [name], MD       [START ON 01/20/2022] ceFAZolin (ANCEF) IVPB 2g/100 mL premix  2 g Intravenous To OR [name], MD       [START ON 01/17/2022] chlorhexidine (PERIDEX) 0.12 % solution 15 mL  15 mL Mouth/Throat Once [name], MD       Chlorhexidine Gluconate Cloth 2 % PADS 6 each  6 each Topical Once [name], MD       And   Derrill Memo ON 01/19/2022] Chlorhexidine Gluconate Cloth 2 % PADS 6 each  6 each Topical Once [name], MD       cinacalcet [facility]) tablet 90 mg  90 mg Oral Once per day on Mon Wed Fri Loren Racer, PA-C   90 mg at 01/04/22 1610   Darbepoetin Alfa  (ARANESP) injection 100 mcg  100 mcg Intravenous Q Wed-HD [place], Ria Comment, [place]       Derrill Memo ON 01/13/2022] dexmedetomidine (PRECEDEX) 400 MCG/100ML (4 mcg/mL) infusion  0.1-0.7 mcg/kg/hr Intravenous To OR [name], MD       [START ON 01/02/2022] diazepam (VALIUM) tablet 2 mg  2 mg Oral Once [name], MD       diazepam (VALIUM) tablet 5 mg  5 mg Oral Q4H PRN Troy Sine, MD   5 mg at 01/05/22 0831   doxercalciferol (HECTOROL) injection 9 mcg  9 mcg Intravenous Q M,W,F-HD Loren Racer, PA-C   9 mcg at 01/04/22 1023   [START ON 01/11/2022] EPINEPHrine (ADRENALIN) 5 mg in NS 250 mL (0.02 mg/mL) premix infusion  0-10 mcg/min Intravenous To OR [name], MD       [START ON 01/08/2022] heparin 30,000 units/NS 1000 mL solution for CELLSAVER   Other To OR [name], MD       heparin ADULT infusion 100 units/mL (25000 units/277mL)  1,250 Units/hr Intravenous Continuous Einar Grad, RPH 12.5 mL/hr at 01/05/22 1419 1,250 Units/hr at 01/05/22  1419   [START ON 01/13/2022] heparin sodium (porcine) 2,500 Units, papaverine 30 mg in electrolyte-A (PLASMALYTE-A PH 7.4) 500 mL irrigation   Irrigation To OR [name], MD       hydrALAZINE (APRESOLINE) tablet 25 mg  25 mg Oral Q8H PRN [place] Eric J, DO       insulin aspart (novoLOG) injection 0-5 Units  0-5 Units Subcutaneous QHS Shela Leff, MD       insulin aspart (novoLOG) injection 0-6 Units  0-6 Units Subcutaneous TID WC Shela Leff, MD   1 Units at 01/05/22 1223   [START ON 01/19/2022] insulin regular, human (MYXREDLIN) 100 units/ 100 mL infusion   Intravenous To OR [name], MD       latanoprost (XALATAN) 0.005 % ophthalmic solution 1 drop  1 drop Both Eyes QHS [place] Donnamarie Poag, DO   1 drop at 01/04/22 2224   macitentan (OPSUMIT) tablet 10 mg  10 mg Oral Daily [place] Donnamarie Poag, DO   10 mg at 01/05/22 1024   [START ON 01/21/2022] magnesium sulfate (IV Push/IM)  injection 40 mEq  40 mEq Other To OR [name], MD       metoprolol tartrate (LOPRESSOR) tablet 12.5 mg  12.5 mg Oral BID Lelon Perla, MD   12.5 mg at 01/04/22 1146   [START ON 01/04/2022] metoprolol tartrate (LOPRESSOR) tablet 12.5 mg  12.5 mg Oral Once [name], MD       [START ON 01/19/2022] milrinone (PRIMACOR) 20 MG/100 ML (0.2 mg/mL) infusion  0.3 mcg/kg/min Intravenous To OR [name], MD       [START ON 01/13/2022] nitroGLYCERIN 50 mg in dextrose 5 % 250 mL (0.2 mg/mL) infusion  2-200 mcg/min Intravenous To OR [name], MD       [START ON 01/05/2022] norepinephrine (LEVOPHED) 4mg  in 265mL (0.016 mg/mL) premix infusion  0-40 mcg/min Intravenous To OR [name], MD       pantoprazole (PROTONIX) EC tablet 40 mg  40 mg Oral Daily PRN [place] Eric J, DO       [START ON 01/15/2022] phenylephrine (NEO-SYNEPHRINE) 20mg /NS 270mL premix infusion  30-200 mcg/min Intravenous To OR [name], MD       [START ON 01/07/2022] potassium chloride injection 80 mEq  80 mEq Other To OR [name], MD       rosuvastatin (CRESTOR) tablet 20 mg  20 mg Oral QHS [place] Donnamarie Poag, DO   20 mg at 01/04/22 2224   sildenafil (REVATIO) tablet 40 mg  40 mg Oral TID [place] Eric J, DO   40 mg at 01/05/22 1025   sodium chloride flush (NS) 0.9 % injection 3 mL  3 mL Intravenous Q12H Isaiah Serge, NP   3 mL at 01/04/22 2225   sodium chloride flush (NS) 0.9 % injection 3 mL  3 mL Intravenous Q12H Troy Sine, MD   3 mL at 01/04/22 1148   sodium chloride flush (NS) 0.9 % injection 3 mL  3 mL Intravenous PRN Troy Sine, MD       [START ON 01/18/2022] tranexamic acid (CYKLOKAPRON) 2,500 mg in sodium chloride 0.9 % 250 mL (10 mg/mL) infusion  1.5 mg/kg/hr Intravenous To OR [name], MD       [START ON 01/08/2022] tranexamic acid (CYKLOKAPRON) bolus via infusion - over 30 minutes 1,162.5 mg  15 mg/kg Intravenous To OR  [name], MD       [  START ON 01/16/2022] tranexamic acid (CYKLOKAPRON) pump prime solution 155 mg  2 mg/kg Intracatheter To OR [name], MD       [START ON 01/05/2022] vancomycin (VANCOREADY) IVPB 1250 mg/250 mL  1,250 mg Intravenous To OR [name], MD        Pt meds include: Statin :Yes Betablocker: Yes ASA: Yes Other anticoagulants/antiplatelets: Eliquis  Past Medical History:  Diagnosis Date   Anemia    Chronic diastolic CHF (congestive heart failure) (HCC)    Chronic diastolic heart failure (HCC)    CKD (chronic kidney disease) stage 4, GFR 15-29 ml/min (HCC)    dialysis M/W/F   Constipation    CVA (cerebral infarction) 1997   no residual deficit   Diverticulitis    DM (diabetes mellitus) (HCC)    type 2   Heart murmur    History of cardiovascular stress test    a. Myoview Oct 2012 showed EF 49%, no ischemia, LVE   HLD (hyperlipidemia)    takes Crestor daily   Hypertension    Pericardial effusion    chronic; felt to be poss related to minoxidil >> DC'd   Pulmonary hypertension ([place]) 06/18/2015   Respiratory failure, acute hypoxic, post-operative 07/08/2015   Requiring ECMO support   S/P cardiac catheterization    a. R/L [facility] 06/18/15:  mLAD 30%; severe pulmo HTN with PA sat 43%, CI 1.86, prominent V waves indicative of MR; resting hypoxemia O2 sat 86% on RA   S/P minimally invasive mitral valve repair 07/08/2015   Complex valvuloplasty including artificial Gore-tex neochord placement x10 and 26 mm Sorin Memo 3D Rechord ring annuloplasty via right mini thoracotomy approach   Severe mitral regurgitation    Shortness of breath dyspnea    Stroke ([place]) 1997   no residual effect   Vitamin D deficiency    Wears glasses     Past Surgical History:  Procedure Laterality Date   A/V FISTULAGRAM Right 08/16/2017   Procedure: A/V Fistulagram;  Surgeon: Conrad Pax, MD;  Location: [place] [facility];  Service: Cardiovascular;  Laterality: Right;    AV FISTULA PLACEMENT Left 10/22/2015   Procedure: RADIOCEPHALIC ARTERIOVENOUS (AV) FISTULA CREATION;  Surgeon: Serafina Mitchell, MD;  Location: [place] OR;  Service: Vascular;  Laterality: Left;   AV FISTULA PLACEMENT Right 05/22/2017   Procedure: ARTERIOVENOUS (AV) FISTULA CREATION-RIGHT ARM;  Surgeon: Waynetta Sandy, MD;  Location: [place];  Service: Vascular;  Laterality: Right;   CANNULATION FOR CARDIOPULMONARY BYPASS N/A 07/08/2015   Procedure: CANNULATION FOR ECMO;  Surgeon: Rexene Alberts, MD;  Location: [place];  Service: Open Heart Surgery;  Laterality: N/A;   CARDIAC CATHETERIZATION     CARDIAC CATHETERIZATION N/A 06/18/2015   Procedure: Right/Left Heart Cath and Coronary Angiography;  Surgeon: Jettie Booze, MD;  Location: [place] [facility];  Service: Cardiovascular;  Laterality: N/A;   CESAREAN SECTION     x 2   COLONOSCOPY     EXCHANGE OF A DIALYSIS CATHETER N/A 06/28/2017   Procedure: EXCHANGE OF A DIALYSIS CATHETER;  Surgeon: Waynetta Sandy, MD;  Location: [place];  Service: Vascular;  Laterality: N/A;   FISTULA SUPERFICIALIZATION Left 12/28/2015   Procedure: SUPERFICIALIZATION LEFT RADIOCEPHALIC FISTULA;  Surgeon: Serafina Mitchell, MD;  Location: [place];  Service: Vascular;  Laterality: Left;   FISTULA SUPERFICIALIZATION Right 08/21/2017   Procedure: FISTULA SUPERFICIALIZATION RIGHT ARM;  Surgeon: Waynetta Sandy, MD;  Location: [place];  Service: Vascular;  Laterality: Right;  INSERTION OF DIALYSIS CATHETER Right 04/28/2017   Procedure: INSERTION OF DIALYSIS CATHETER-RIGHT INTERNAL JUGULAR PLACEMENT;  Surgeon: Elam Dutch, MD;  Location: [place];  Service: Vascular;  Laterality: Right;   LEFT HEART CATH AND CORONARY ANGIOGRAPHY N/A 01/15/2022   Procedure: LEFT HEART CATH AND CORONARY ANGIOGRAPHY;  Surgeon: Troy Sine, MD;  Location: [place] [facility];  Service: Cardiovascular;  Laterality: N/A;   LIGATION OF ARTERIOVENOUS  FISTULA Left 04/28/2017    Procedure: LIGATION OF ARTERIOVENOUS  FISTULA;  Surgeon: Elam Dutch, MD;  Location: [place];  Service: Vascular;  Laterality: Left;   MITRAL VALVE REPAIR Right 07/08/2015   Procedure: MINIMALLY INVASIVE MITRAL VALVE REPAIR with a 26 Sorin Memo 3D Rechord;  Surgeon: Rexene Alberts, MD;  Location: [place];  Service: Open Heart Surgery;  Laterality: Right;   MULTIPLE EXTRACTIONS WITH ALVEOLOPLASTY N/A 06/30/2015   Procedure: MULTIPLE EXTRACTIONS OF TOOTH #'S 4  AND 30 WITH ALVEOLOPLASTY AND GROSS DEBRIDEMENT  OF REMAINING TEETH;  Surgeon: Lenn Cal, DDS;  Location: [place];  Service: Oral Surgery;  Laterality: N/A;   PERIPHERAL VASCULAR CATHETERIZATION Left 03/28/2016   Procedure: A/V Fistulagram;  Surgeon: Serafina Mitchell, MD;  Location: [place] [facility];  Service: Cardiovascular;  Laterality: Left;  lower arm   TEE WITHOUT CARDIOVERSION N/A 06/08/2015   Procedure: TRANSESOPHAGEAL ECHOCARDIOGRAM (TEE);  Surgeon: Lelon Perla, MD;  [facility];  Service: Cardiovascular;  Laterality: N/A;   TEE WITHOUT CARDIOVERSION N/A 07/08/2015   Procedure: TRANSESOPHAGEAL ECHOCARDIOGRAM (TEE);  Surgeon: Rexene Alberts, MD;  Location: [place];  Service: Open Heart Surgery;  Laterality: N/A;    Social History Social History   Tobacco Use   Smoking status: Never   Smokeless tobacco: Never  Vaping Use   Vaping Use: Never used  Substance Use Topics   Alcohol use: No    Alcohol/week: 0.0 standard drinks   Drug use: No    Family History Family History  Problem Relation Age of Onset   Stroke Mother    Breast cancer Sister    Cancer Sister        breast cancer   Heart attack Brother        MI in his 35s   Heart disease Brother        before age 68   Cancer Brother     Allergies  Allergen Reactions   Minoxidil Other (See Comments)    Pericardial effusion   Lipitor [Atorvastatin] Other (See Comments)    MYALGIAS > "pain in legs" Tolerates rosuvastatin   Morphine And Related  Itching   Ace Inhibitors Other (See Comments)    REACTION: "not sure...think it made me drowsy all the time"     REVIEW OF SYSTEMS  General: [ ]  Weight loss, [ ]  Fever, [ ]  chills Neurologic: [ ]  Dizziness, [ ]  Blackouts, [ ]  Seizure [ ]  Stroke, [ ]  "Mini stroke", [ ]  Slurred speech, [ ]  Temporary blindness; [ ]  weakness in arms or legs, [ ]  Hoarseness [ ]  Dysphagia Cardiac: [ x] Chest pain/pressure, [ ]  Shortness of breath at rest [ ]  Shortness of breath with exertion, [ ]  Atrial fibrillation or irregular heartbeat  Vascular: [ ]  Pain in legs with walking, [ ]  Pain in legs at rest, [ ]  Pain in legs at night,  [ ]  Non-healing ulcer, [ ]  Blood clot in vein/DVT,   Pulmonary: [ ]  Home oxygen, [ ]  Productive cough, [ ]  Coughing up  blood, [ ]  Asthma,  [ ]  Wheezing [ ]  COPD Musculoskeletal:  [ ]  Arthritis, [ ]  Low back pain, [ ]  Joint pain Hematologic: [ ]  Easy Bruising, [ ]  Anemia; [ ]  Hepatitis Gastrointestinal: [ ]  Blood in stool, [ ]  Gastroesophageal Reflux/heartburn, Urinary: [ ]  chronic Kidney disease, [ x] on HD - [ ]  MWF or [ ]  TTHS, [ ]  Burning with urination, [ ]  Difficulty urinating Skin: [ ]  Rashes, [ ]  Wounds Psychological: [ ]  Anxiety, [ ]  Depression  Physical Examination Vitals:   01/05/22 0900 01/05/22 1000 01/05/22 1024 01/05/22 1124  BP:   96/60 91/62  Pulse: 89 90  89  Resp: (!) 23 19  18   Temp:      TempSrc:      SpO2: 93% 97%  93%  Weight:      Height:       Body mass index is 33.92 kg/m.  General:  WDWN in NAD HENT: WNL Eyes: Pupils equal Pulmonary: normal non-labored breathing , without Rales, rhonchi,  wheezing Cardiac: RRR, without  CP current No carotid bruits Abdomen: soft, NT, no masses Skin: no rashes, ulcers noted;  no Gangrene , no cellulitis; no open wounds;   Vascular Exam/Pulses: palpable right fistula thrill, motor and sensation are intact.  Musculoskeletal: no muscle wasting or atrophy; no edema  Neurologic: A&O X 3; Appropriate Affect ;   SENSATION: normal; MOTOR FUNCTION: 5/5 Symmetric Speech is fluent/normal   Significant Diagnostic Studies: CBC Lab Results  Component Value Date   WBC 4.7 01/05/2022   HGB 9.8 (L) 01/05/2022   HCT 30.7 (L) 01/05/2022   MCV 86.5 01/05/2022   PLT 94 (L) 01/05/2022    BMET    Component Value Date/Time   NA 134 (L) 01/05/2022 0421   NA 140 03/03/2013 0955   K 3.8 01/05/2022 0421   K 3.8 03/03/2013 0955   CL 94 (L) 01/05/2022 0421   CL 99 03/03/2013 0955   CO2 23 01/05/2022 0421   CO2 29 03/03/2013 0955   GLUCOSE 127 (H) 01/05/2022 0421   GLUCOSE 210 (H) 03/03/2013 0955   BUN 25 (H) 01/05/2022 0421   BUN 28.1 (H) 03/03/2013 0955   CREATININE 7.20 (H) 01/05/2022 0421   CREATININE 7.21 (H) 12/08/2021 1110   CREATININE 1.9 (H) 03/03/2013 0955   CALCIUM 8.1 (L) 01/05/2022 0421   CALCIUM 9.9 03/03/2013 0955   GFRNONAA 6 (L) 01/05/2022 0421   GFRNONAA 5 (L) 04/21/2021 1614   GFRAA 6 (L) 04/21/2021 1614   Estimated Creatinine Clearance: 7 mL/min (A) (by C-G formula based on SCr of 7.2 mg/dL (H)).  COAG Lab Results  Component Value Date   INR 1.1 08/28/2021   INR 1.05 04/28/2017   INR 1.04 04/27/2017     Non-Invasive Vascular Imaging:   Findings:  +--------------------+----------+-----------------+--------+   AVF                  PSV (cm/s) Flow Vol (mL/min) Comments   +--------------------+----------+-----------------+--------+   Native artery inflow    110            302                   +--------------------+----------+-----------------+--------+   AVF Anastomosis         311                                  +--------------------+----------+-----------------+--------+      +------------+---------+------------+----------+---------------------------  ----+  OUTFLOW VEIN    PSV      Diameter   Depth (cm)            Describe                                (cm/s)       (cm)                                                    +------------+---------+------------+----------+---------------------------  ----+   Prox Forearm    42         2.46        0.18        change in Diameter and                                                                   aneurysmal               +------------+---------+------------+----------+---------------------------  ----+   Mid Forearm     93         0.63        0.46                                       +------------+---------+------------+----------+---------------------------  ----+   Dist Forearm    72         0.59        0.36                                       +------------+---------+------------+----------+---------------------------  ----+         Summary:  Arteriovenous fistula-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 No  to-fro  flow identified to suggest pseudoaneurysm.   ASSESSMENT/PLAN:  ESRD right forearm fistula with central edema from infiltrate> 48 hours ago. The fistula has a good palpable thrill, motor intact and currently on HD using the fistula without run problems. Working fistula with medial infiltrate.  Stick the fistula and avoid infiltrated area.   Call [facility]VS if further intervention is needed.   Roxy Horseman 01/05/2022 2:21 PM   I have seen and evaluated the patient. I agree with the PA note as documented above.  66 year old female that is scheduled for CABG tomorrow and vascular surgery was called about placement of a possible catheter for dialysis given infiltration of her right arm AV fistula.  She has a right radiocephalic AV fistula placed in 2018 by Dr. Donzetta Matters and this has undergone sidebranch ligation.  She states the fistula infiltrated on Tuesday.  She is seen in dialysis and her forearm is fairly swollen but soft.  She is currently getting dialysis and the dialysis nurses stated they had no trouble accessing the fistula.  It is currently working well.  I do not see any indication to place a catheter unless she develops  issues postop following her CABG and  the fistula can no longer be used.  We can certainly follow.  Marty Heck, MD [facility] [place] Office: [phone]

## 2022-01-06 ENCOUNTER — Inpatient Hospital Stay (HOSPITAL_COMMUNITY): Payer: Medicare Other

## 2022-01-06 ENCOUNTER — Inpatient Hospital Stay (HOSPITAL_COMMUNITY): Payer: Medicare Other | Admitting: Anesthesiology

## 2022-01-06 ENCOUNTER — Inpatient Hospital Stay (HOSPITAL_COMMUNITY): Admission: EM | Disposition: E | Payer: Self-pay | Source: Home / Self Care | Attending: Internal Medicine

## 2022-01-06 DIAGNOSIS — I362 Nonrheumatic tricuspid (valve) stenosis with insufficiency: Secondary | ICD-10-CM | POA: Diagnosis not present

## 2022-01-06 DIAGNOSIS — N186 End stage renal disease: Secondary | ICD-10-CM | POA: Diagnosis not present

## 2022-01-06 DIAGNOSIS — I110 Hypertensive heart disease with heart failure: Secondary | ICD-10-CM | POA: Diagnosis not present

## 2022-01-06 DIAGNOSIS — Z992 Dependence on renal dialysis: Secondary | ICD-10-CM | POA: Diagnosis not present

## 2022-01-06 DIAGNOSIS — I2510 Atherosclerotic heart disease of native coronary artery without angina pectoris: Secondary | ICD-10-CM | POA: Diagnosis not present

## 2022-01-06 DIAGNOSIS — I509 Heart failure, unspecified: Secondary | ICD-10-CM | POA: Diagnosis not present

## 2022-01-06 DIAGNOSIS — I361 Nonrheumatic tricuspid (valve) insufficiency: Secondary | ICD-10-CM | POA: Diagnosis not present

## 2022-01-06 DIAGNOSIS — Q2112 Patent foramen ovale: Secondary | ICD-10-CM | POA: Diagnosis not present

## 2022-01-06 HISTORY — PX: TRICUSPID VALVE REPLACEMENT: SHX816

## 2022-01-06 HISTORY — PX: CORONARY ARTERY BYPASS GRAFT: SHX141

## 2022-01-06 HISTORY — PX: REPAIR OF PATENT FORAMEN OVALE: SHX6064

## 2022-01-06 HISTORY — PX: ENDOVEIN HARVEST OF GREATER SAPHENOUS VEIN: SHX5059

## 2022-01-06 HISTORY — PX: TEE WITHOUT CARDIOVERSION: SHX5443

## 2022-01-06 LAB — POCT I-STAT 7, (LYTES, BLD GAS, ICA,H+H)
Acid-Base Excess: 6 mmol/L — ABNORMAL HIGH (ref 0.0–2.0)
Acid-Base Excess: 4 mmol/L — ABNORMAL HIGH (ref 0.0–2.0)
Acid-Base Excess: 5 mmol/L — ABNORMAL HIGH (ref 0.0–2.0)
Acid-Base Excess: 4 mmol/L — ABNORMAL HIGH (ref 0.0–2.0)
Acid-Base Excess: 2 mmol/L (ref 0.0–2.0)
Acid-base deficit: 1 mmol/L (ref 0.0–2.0)
Acid-base deficit: 8 mmol/L — ABNORMAL HIGH (ref 0.0–2.0)
Bicarbonate: 29.4 mmol/L — ABNORMAL HIGH (ref 20.0–28.0)
Bicarbonate: 27.9 mmol/L (ref 20.0–28.0)
Bicarbonate: 29.2 mmol/L — ABNORMAL HIGH (ref 20.0–28.0)
Bicarbonate: 27.4 mmol/L (ref 20.0–28.0)
Bicarbonate: 23.7 mmol/L (ref 20.0–28.0)
Bicarbonate: 19.2 mmol/L — ABNORMAL LOW (ref 20.0–28.0)
Bicarbonate: 27.4 mmol/L (ref 20.0–28.0)
Calcium, Ion: 0.95 mmol/L — ABNORMAL LOW (ref 1.15–1.40)
Calcium, Ion: 0.9 mmol/L — ABNORMAL LOW (ref 1.15–1.40)
Calcium, Ion: 0.95 mmol/L — ABNORMAL LOW (ref 1.15–1.40)
Calcium, Ion: 0.92 mmol/L — ABNORMAL LOW (ref 1.15–1.40)
Calcium, Ion: 1.28 mmol/L (ref 1.15–1.40)
Calcium, Ion: 0.96 mmol/L — ABNORMAL LOW (ref 1.15–1.40)
Calcium, Ion: 1.02 mmol/L — ABNORMAL LOW (ref 1.15–1.40)
HCT: 22 % — ABNORMAL LOW (ref 36.0–46.0)
HCT: 20 % — ABNORMAL LOW (ref 36.0–46.0)
HCT: 26 % — ABNORMAL LOW (ref 36.0–46.0)
HCT: 22 % — ABNORMAL LOW (ref 36.0–46.0)
HCT: 27 % — ABNORMAL LOW (ref 36.0–46.0)
HCT: 27 % — ABNORMAL LOW (ref 36.0–46.0)
HCT: 31 % — ABNORMAL LOW (ref 36.0–46.0)
Hemoglobin: 7.5 g/dL — ABNORMAL LOW (ref 12.0–15.0)
Hemoglobin: 6.8 g/dL — CL (ref 12.0–15.0)
Hemoglobin: 8.8 g/dL — ABNORMAL LOW (ref 12.0–15.0)
Hemoglobin: 7.5 g/dL — ABNORMAL LOW (ref 12.0–15.0)
Hemoglobin: 9.2 g/dL — ABNORMAL LOW (ref 12.0–15.0)
Hemoglobin: 10.5 g/dL — ABNORMAL LOW (ref 12.0–15.0)
Hemoglobin: 9.2 g/dL — ABNORMAL LOW (ref 12.0–15.0)
O2 Saturation: 100 %
O2 Saturation: 100 %
O2 Saturation: 100 %
O2 Saturation: 100 %
O2 Saturation: 100 %
O2 Saturation: 100 %
O2 Saturation: 100 %
Potassium: 3.2 mmol/L — ABNORMAL LOW (ref 3.5–5.1)
Potassium: 3.4 mmol/L — ABNORMAL LOW (ref 3.5–5.1)
Potassium: 3.8 mmol/L (ref 3.5–5.1)
Potassium: 3.6 mmol/L (ref 3.5–5.1)
Potassium: 4 mmol/L (ref 3.5–5.1)
Potassium: 3.2 mmol/L — ABNORMAL LOW (ref 3.5–5.1)
Potassium: 3.6 mmol/L (ref 3.5–5.1)
Sodium: 138 mmol/L (ref 135–145)
Sodium: 136 mmol/L (ref 135–145)
Sodium: 136 mmol/L (ref 135–145)
Sodium: 137 mmol/L (ref 135–145)
Sodium: 137 mmol/L (ref 135–145)
Sodium: 144 mmol/L (ref 135–145)
Sodium: 144 mmol/L (ref 135–145)
TCO2: 31 mmol/L (ref 22–32)
TCO2: 29 mmol/L (ref 22–32)
TCO2: 30 mmol/L (ref 22–32)
TCO2: 28 mmol/L (ref 22–32)
TCO2: 25 mmol/L (ref 22–32)
TCO2: 21 mmol/L — ABNORMAL LOW (ref 22–32)
TCO2: 29 mmol/L (ref 22–32)
pCO2 arterial: 38.5 mmHg (ref 32.0–48.0)
pCO2 arterial: 34.9 mmHg (ref 32.0–48.0)
pCO2 arterial: 43.4 mmHg (ref 32.0–48.0)
pCO2 arterial: 34.6 mmHg (ref 32.0–48.0)
pCO2 arterial: 40.2 mmHg (ref 32.0–48.0)
pCO2 arterial: 44.4 mmHg (ref 32.0–48.0)
pCO2 arterial: 45.1 mmHg (ref 32.0–48.0)
pH, Arterial: 7.491 — ABNORMAL HIGH (ref 7.350–7.450)
pH, Arterial: 7.511 — ABNORMAL HIGH (ref 7.350–7.450)
pH, Arterial: 7.436 (ref 7.350–7.450)
pH, Arterial: 7.507 — ABNORMAL HIGH (ref 7.350–7.450)
pH, Arterial: 7.379 (ref 7.350–7.450)
pH, Arterial: 7.238 — ABNORMAL LOW (ref 7.350–7.450)
pH, Arterial: 7.399 (ref 7.350–7.450)
pO2, Arterial: 370 mmHg — ABNORMAL HIGH (ref 83.0–108.0)
pO2, Arterial: 321 mmHg — ABNORMAL HIGH (ref 83.0–108.0)
pO2, Arterial: 312 mmHg — ABNORMAL HIGH (ref 83.0–108.0)
pO2, Arterial: 300 mmHg — ABNORMAL HIGH (ref 83.0–108.0)
pO2, Arterial: 375 mmHg — ABNORMAL HIGH (ref 83.0–108.0)
pO2, Arterial: 240 mmHg — ABNORMAL HIGH (ref 83.0–108.0)
pO2, Arterial: 445 mmHg — ABNORMAL HIGH (ref 83.0–108.0)

## 2022-01-06 LAB — COMPREHENSIVE METABOLIC PANEL
ALT: 13 U/L (ref 0–44)
AST: 15 U/L (ref 15–41)
Albumin: 3.6 g/dL (ref 3.5–5.0)
Alkaline Phosphatase: 85 U/L (ref 38–126)
Anion gap: 15 (ref 5–15)
BUN: 15 mg/dL (ref 8–23)
CO2: 24 mmol/L (ref 22–32)
Calcium: 8.7 mg/dL — ABNORMAL LOW (ref 8.9–10.3)
Chloride: 96 mmol/L — ABNORMAL LOW (ref 98–111)
Creatinine, Ser: 5.42 mg/dL — ABNORMAL HIGH (ref 0.44–1.00)
GFR, Estimated: 8 mL/min — ABNORMAL LOW (ref >60)
Glucose, Bld: 112 mg/dL — ABNORMAL HIGH (ref 70–99)
Potassium: 3.2 mmol/L — ABNORMAL LOW (ref 3.5–5.1)
Sodium: 135 mmol/L (ref 135–145)
Total Bilirubin: 0.7 mg/dL (ref 0.3–1.2)
Total Protein: 7 g/dL (ref 6.5–8.1)

## 2022-01-06 LAB — POCT I-STAT, CHEM 8
BUN: 15 mg/dL (ref 8–23)
BUN: 17 mg/dL (ref 8–23)
BUN: 18 mg/dL (ref 8–23)
BUN: 19 mg/dL (ref 8–23)
BUN: 19 mg/dL (ref 8–23)
BUN: 18 mg/dL (ref 8–23)
BUN: 16 mg/dL (ref 8–23)
BUN: 17 mg/dL (ref 8–23)
Calcium, Ion: 0.88 mmol/L — CL (ref 1.15–1.40)
Calcium, Ion: 1.08 mmol/L — ABNORMAL LOW (ref 1.15–1.40)
Calcium, Ion: 1.09 mmol/L — ABNORMAL LOW (ref 1.15–1.40)
Calcium, Ion: 0.99 mmol/L — ABNORMAL LOW (ref 1.15–1.40)
Calcium, Ion: 0.94 mmol/L — ABNORMAL LOW (ref 1.15–1.40)
Calcium, Ion: 0.94 mmol/L — ABNORMAL LOW (ref 1.15–1.40)
Calcium, Ion: 0.63 mmol/L — CL (ref 1.15–1.40)
Calcium, Ion: 1.29 mmol/L (ref 1.15–1.40)
Chloride: 87 mmol/L — ABNORMAL LOW (ref 98–111)
Chloride: 95 mmol/L — ABNORMAL LOW (ref 98–111)
Chloride: 97 mmol/L — ABNORMAL LOW (ref 98–111)
Chloride: 96 mmol/L — ABNORMAL LOW (ref 98–111)
Chloride: 97 mmol/L — ABNORMAL LOW (ref 98–111)
Chloride: 98 mmol/L (ref 98–111)
Chloride: 103 mmol/L (ref 98–111)
Chloride: 98 mmol/L (ref 98–111)
Creatinine, Ser: 3.8 mg/dL — ABNORMAL HIGH (ref 0.44–1.00)
Creatinine, Ser: 5.9 mg/dL — ABNORMAL HIGH (ref 0.44–1.00)
Creatinine, Ser: 5.5 mg/dL — ABNORMAL HIGH (ref 0.44–1.00)
Creatinine, Ser: 5.2 mg/dL — ABNORMAL HIGH (ref 0.44–1.00)
Creatinine, Ser: 4.9 mg/dL — ABNORMAL HIGH (ref 0.44–1.00)
Creatinine, Ser: 5.1 mg/dL — ABNORMAL HIGH (ref 0.44–1.00)
Creatinine, Ser: 4.1 mg/dL — ABNORMAL HIGH (ref 0.44–1.00)
Creatinine, Ser: 4.4 mg/dL — ABNORMAL HIGH (ref 0.44–1.00)
Glucose, Bld: 148 mg/dL — ABNORMAL HIGH (ref 70–99)
Glucose, Bld: 92 mg/dL (ref 70–99)
Glucose, Bld: 117 mg/dL — ABNORMAL HIGH (ref 70–99)
Glucose, Bld: 95 mg/dL (ref 70–99)
Glucose, Bld: 121 mg/dL — ABNORMAL HIGH (ref 70–99)
Glucose, Bld: 111 mg/dL — ABNORMAL HIGH (ref 70–99)
Glucose, Bld: 136 mg/dL — ABNORMAL HIGH (ref 70–99)
Glucose, Bld: 153 mg/dL — ABNORMAL HIGH (ref 70–99)
HCT: 25 % — ABNORMAL LOW (ref 36.0–46.0)
HCT: 32 % — ABNORMAL LOW (ref 36.0–46.0)
HCT: 29 % — ABNORMAL LOW (ref 36.0–46.0)
HCT: 23 % — ABNORMAL LOW (ref 36.0–46.0)
HCT: 25 % — ABNORMAL LOW (ref 36.0–46.0)
HCT: 23 % — ABNORMAL LOW (ref 36.0–46.0)
HCT: 25 % — ABNORMAL LOW (ref 36.0–46.0)
HCT: 28 % — ABNORMAL LOW (ref 36.0–46.0)
Hemoglobin: 10.9 g/dL — ABNORMAL LOW (ref 12.0–15.0)
Hemoglobin: 8.5 g/dL — ABNORMAL LOW (ref 12.0–15.0)
Hemoglobin: 9.9 g/dL — ABNORMAL LOW (ref 12.0–15.0)
Hemoglobin: 7.8 g/dL — ABNORMAL LOW (ref 12.0–15.0)
Hemoglobin: 8.5 g/dL — ABNORMAL LOW (ref 12.0–15.0)
Hemoglobin: 7.8 g/dL — ABNORMAL LOW (ref 12.0–15.0)
Hemoglobin: 8.5 g/dL — ABNORMAL LOW (ref 12.0–15.0)
Hemoglobin: 9.5 g/dL — ABNORMAL LOW (ref 12.0–15.0)
Potassium: 2.5 mmol/L — CL (ref 3.5–5.1)
Potassium: 3.1 mmol/L — ABNORMAL LOW (ref 3.5–5.1)
Potassium: 3 mmol/L — ABNORMAL LOW (ref 3.5–5.1)
Potassium: 3 mmol/L — ABNORMAL LOW (ref 3.5–5.1)
Potassium: 4.3 mmol/L (ref 3.5–5.1)
Potassium: 3.4 mmol/L — ABNORMAL LOW (ref 3.5–5.1)
Potassium: 4 mmol/L (ref 3.5–5.1)
Potassium: 4.9 mmol/L (ref 3.5–5.1)
Sodium: 124 mmol/L — ABNORMAL LOW (ref 135–145)
Sodium: 135 mmol/L (ref 135–145)
Sodium: 136 mmol/L (ref 135–145)
Sodium: 137 mmol/L (ref 135–145)
Sodium: 135 mmol/L (ref 135–145)
Sodium: 137 mmol/L (ref 135–145)
Sodium: 137 mmol/L (ref 135–145)
Sodium: 137 mmol/L (ref 135–145)
TCO2: 20 mmol/L — ABNORMAL LOW (ref 22–32)
TCO2: 26 mmol/L (ref 22–32)
TCO2: 26 mmol/L (ref 22–32)
TCO2: 30 mmol/L (ref 22–32)
TCO2: 29 mmol/L (ref 22–32)
TCO2: 27 mmol/L (ref 22–32)
TCO2: 23 mmol/L (ref 22–32)
TCO2: 24 mmol/L (ref 22–32)

## 2022-01-06 LAB — MAGNESIUM
Magnesium: 1.9 mg/dL (ref 1.7–2.4)
Magnesium: 2 mg/dL (ref 1.7–2.4)

## 2022-01-06 LAB — CBC
HCT: 33.9 % — ABNORMAL LOW (ref 36.0–46.0)
HCT: 33.5 % — ABNORMAL LOW (ref 36.0–46.0)
Hemoglobin: 11 g/dL — ABNORMAL LOW (ref 12.0–15.0)
Hemoglobin: 11 g/dL — ABNORMAL LOW (ref 12.0–15.0)
MCH: 28.4 pg (ref 26.0–34.0)
MCH: 28.8 pg (ref 26.0–34.0)
MCHC: 32.4 g/dL (ref 30.0–36.0)
MCHC: 32.8 g/dL (ref 30.0–36.0)
MCV: 87.4 fL (ref 80.0–100.0)
MCV: 87.7 fL (ref 80.0–100.0)
Platelets: 105 10*3/uL — ABNORMAL LOW (ref 150–400)
Platelets: 147 10*3/uL — ABNORMAL LOW (ref 150–400)
RBC: 3.88 MIL/uL (ref 3.87–5.11)
RBC: 3.82 MIL/uL — ABNORMAL LOW (ref 3.87–5.11)
RDW: 18.3 % — ABNORMAL HIGH (ref 11.5–15.5)
RDW: 19 % — ABNORMAL HIGH (ref 11.5–15.5)
WBC: 11.6 10*3/uL — ABNORMAL HIGH (ref 4.0–10.5)
WBC: 13.1 10*3/uL — ABNORMAL HIGH (ref 4.0–10.5)
nRBC: 2 % — ABNORMAL HIGH (ref 0.0–0.2)
nRBC: 2.6 % — ABNORMAL HIGH (ref 0.0–0.2)

## 2022-01-06 LAB — PREPARE RBC (CROSSMATCH)

## 2022-01-06 LAB — PROTIME-INR
INR: 1.9 — ABNORMAL HIGH (ref 0.8–1.2)
Prothrombin Time: 21.8 seconds — ABNORMAL HIGH (ref 11.4–15.2)

## 2022-01-06 LAB — CBC WITH DIFFERENTIAL/PLATELET
Abs Immature Granulocytes: 0.01 10*3/uL (ref 0.00–0.07)
Basophils Absolute: 0 10*3/uL (ref 0.0–0.1)
Basophils Relative: 1 %
Eosinophils Absolute: 0.2 10*3/uL (ref 0.0–0.5)
Eosinophils Relative: 4 %
HCT: 29.1 % — ABNORMAL LOW (ref 36.0–46.0)
Hemoglobin: 9.1 g/dL — ABNORMAL LOW (ref 12.0–15.0)
Immature Granulocytes: 0 %
Lymphocytes Relative: 25 %
Lymphs Abs: 1 10*3/uL (ref 0.7–4.0)
MCH: 27.2 pg (ref 26.0–34.0)
MCHC: 31.3 g/dL (ref 30.0–36.0)
MCV: 86.9 fL (ref 80.0–100.0)
Monocytes Absolute: 0.6 10*3/uL (ref 0.1–1.0)
Monocytes Relative: 13 %
Neutro Abs: 2.4 10*3/uL (ref 1.7–7.7)
Neutrophils Relative %: 57 %
Platelets: 97 10*3/uL — ABNORMAL LOW (ref 150–400)
RBC: 3.35 MIL/uL — ABNORMAL LOW (ref 3.87–5.11)
RDW: 21.4 % — ABNORMAL HIGH (ref 11.5–15.5)
Smear Review: DECREASED
WBC: 4.2 10*3/uL (ref 4.0–10.5)
nRBC: 0.5 % — ABNORMAL HIGH (ref 0.0–0.2)

## 2022-01-06 LAB — PHOSPHORUS: Phosphorus: 3.7 mg/dL (ref 2.5–4.6)

## 2022-01-06 LAB — HEMOGLOBIN AND HEMATOCRIT, BLOOD
HCT: 19.3 % — ABNORMAL LOW (ref 36.0–46.0)
HCT: 32.1 % — ABNORMAL LOW (ref 36.0–46.0)
Hemoglobin: 6.3 g/dL — CL (ref 12.0–15.0)
Hemoglobin: 10.7 g/dL — ABNORMAL LOW (ref 12.0–15.0)

## 2022-01-06 LAB — GLUCOSE, CAPILLARY
Glucose-Capillary: 192 mg/dL — ABNORMAL HIGH (ref 70–99)
Glucose-Capillary: 213 mg/dL — ABNORMAL HIGH (ref 70–99)
Glucose-Capillary: 230 mg/dL — ABNORMAL HIGH (ref 70–99)
Glucose-Capillary: 215 mg/dL — ABNORMAL HIGH (ref 70–99)
Glucose-Capillary: 238 mg/dL — ABNORMAL HIGH (ref 70–99)
Glucose-Capillary: 223 mg/dL — ABNORMAL HIGH (ref 70–99)

## 2022-01-06 LAB — POCT I-STAT EG7
Acid-Base Excess: 3 mmol/L — ABNORMAL HIGH (ref 0.0–2.0)
Bicarbonate: 27.6 mmol/L (ref 20.0–28.0)
Calcium, Ion: 1.02 mmol/L — ABNORMAL LOW (ref 1.15–1.40)
HCT: 21 % — ABNORMAL LOW (ref 36.0–46.0)
Hemoglobin: 7.1 g/dL — ABNORMAL LOW (ref 12.0–15.0)
O2 Saturation: 79 %
Potassium: 3 mmol/L — ABNORMAL LOW (ref 3.5–5.1)
Sodium: 138 mmol/L (ref 135–145)
TCO2: 29 mmol/L (ref 22–32)
pCO2, Ven: 41.9 mmHg — ABNORMAL LOW (ref 44.0–60.0)
pH, Ven: 7.426 (ref 7.250–7.430)
pO2, Ven: 43 mmHg (ref 32.0–45.0)

## 2022-01-06 LAB — APTT: aPTT: 41 seconds — ABNORMAL HIGH (ref 24–36)

## 2022-01-06 LAB — BASIC METABOLIC PANEL
Anion gap: 15 (ref 5–15)
BUN: 19 mg/dL (ref 8–23)
CO2: 20 mmol/L — ABNORMAL LOW (ref 22–32)
Calcium: 8.6 mg/dL — ABNORMAL LOW (ref 8.9–10.3)
Chloride: 103 mmol/L (ref 98–111)
Creatinine, Ser: 4.93 mg/dL — ABNORMAL HIGH (ref 0.44–1.00)
GFR, Estimated: 9 mL/min — ABNORMAL LOW (ref >60)
Glucose, Bld: 223 mg/dL — ABNORMAL HIGH (ref 70–99)
Potassium: 3.2 mmol/L — ABNORMAL LOW (ref 3.5–5.1)
Sodium: 138 mmol/L (ref 135–145)

## 2022-01-06 LAB — FIBRINOGEN: Fibrinogen: 121 mg/dL — ABNORMAL LOW (ref 210–475)

## 2022-01-06 LAB — PLATELET COUNT
Platelets: 30 10*3/uL — ABNORMAL LOW (ref 150–400)
Platelets: 72 10*3/uL — ABNORMAL LOW (ref 150–400)

## 2022-01-06 LAB — HEPARIN LEVEL (UNFRACTIONATED): Heparin Unfractionated: 0.47 IU/mL (ref 0.30–0.70)

## 2022-01-06 SURGERY — CORONARY ARTERY BYPASS GRAFTING (CABG)
Anesthesia: General | Site: Leg Upper | Laterality: Right

## 2022-01-06 MED ORDER — CHLORHEXIDINE GLUCONATE CLOTH 2 % EX PADS: 6.0000 | MEDICATED_PAD | Freq: Every day | CUTANEOUS | Status: DC

## 2022-01-06 MED ORDER — ACETAMINOPHEN 160 MG/5ML PO SOLN
1000.0000 mg | Freq: Four times a day (QID) | ORAL | Status: AC
  Administered 2022-01-07 – 2022-01-11 (×17): 1000 mg
  Filled 2022-01-06 (×17): qty 40.6

## 2022-01-06 MED ORDER — FENTANYL CITRATE (PF) 250 MCG/5ML IJ SOLN
INTRAMUSCULAR | Status: DC | PRN
  Administered 2022-01-06: 50 ug via INTRAVENOUS
  Administered 2022-01-06: 100 ug via INTRAVENOUS
  Administered 2022-01-06: 200 ug via INTRAVENOUS

## 2022-01-06 MED ORDER — STERILE WATER FOR IRRIGATION IR SOLN
Status: DC | PRN
  Administered 2022-01-06: 2000 mL

## 2022-01-06 MED ORDER — FENTANYL CITRATE (PF) 250 MCG/5ML IJ SOLN
INTRAMUSCULAR | Status: AC
  Filled 2022-01-06: qty 5

## 2022-01-06 MED ORDER — CEFAZOLIN SODIUM-DEXTROSE 2-4 GM/100ML-% IV SOLN: 2.0000 g | Freq: Three times a day (TID) | INTRAVENOUS | Status: DC

## 2022-01-06 MED ORDER — VASOPRESSIN 20 UNIT/ML IV SOLN
INTRAVENOUS | Status: DC | PRN
  Administered 2022-01-06: 1 [IU] via INTRAVENOUS
  Administered 2022-01-06: 2 [IU] via INTRAVENOUS
  Administered 2022-01-06 (×2): 1 [IU] via INTRAVENOUS
  Administered 2022-01-06: 2 [IU] via INTRAVENOUS
  Administered 2022-01-06 (×2): 1 [IU] via INTRAVENOUS
  Administered 2022-01-06: 2 [IU] via INTRAVENOUS
  Administered 2022-01-06 (×2): 1 [IU] via INTRAVENOUS

## 2022-01-06 MED ORDER — HEPARIN SODIUM (PORCINE) 1000 UNIT/ML IJ SOLN
INTRAMUSCULAR | Status: DC | PRN
  Administered 2022-01-06: 2000 [IU] via INTRAVENOUS
  Administered 2022-01-06: 21000 [IU] via INTRAVENOUS

## 2022-01-06 MED ORDER — CHLORHEXIDINE GLUCONATE 0.12 % MT SOLN: 15.0000 mL | OROMUCOSAL | Status: AC

## 2022-01-06 MED ORDER — ACETAMINOPHEN 650 MG RE SUPP
650.0000 mg | Freq: Once | RECTAL | Status: AC
  Administered 2022-01-06: 650 mg via RECTAL

## 2022-01-06 MED ORDER — VASOPRESSIN 20 UNIT/ML IV SOLN
INTRAVENOUS | Status: AC
  Filled 2022-01-06: qty 1

## 2022-01-06 MED ORDER — PANTOPRAZOLE SODIUM 40 MG PO TBEC: 40.0000 mg | DELAYED_RELEASE_TABLET | Freq: Every day | ORAL | Status: DC

## 2022-01-06 MED ORDER — CHLORHEXIDINE GLUCONATE 0.12% ORAL RINSE (MEDLINE KIT)
15.0000 mL | Freq: Two times a day (BID) | OROMUCOSAL | Status: DC
  Administered 2022-01-06 – 2022-02-04 (×51): 15 mL via OROMUCOSAL

## 2022-01-06 MED ORDER — VASOPRESSIN 20 UNITS/100 ML INFUSION FOR SHOCK
0.0000 [IU]/min | INTRAVENOUS | Status: DC
  Administered 2022-01-06: 0.03 [IU]/min via INTRAVENOUS
  Administered 2022-01-06 – 2022-01-10 (×11): 0.04 [IU]/min via INTRAVENOUS
  Administered 2022-01-10: 0.02 [IU]/min via INTRAVENOUS
  Filled 2022-01-06 (×12): qty 100

## 2022-01-06 MED ORDER — ACETAMINOPHEN 160 MG/5ML PO SOLN: 650.0000 mg | Freq: Once | ORAL | Status: AC

## 2022-01-06 MED ORDER — SODIUM BICARBONATE 8.4 % IV SOLN
INTRAVENOUS | Status: DC | PRN
  Administered 2022-01-06: 50 meq via INTRAVENOUS

## 2022-01-06 MED ORDER — SODIUM CHLORIDE 0.9 % IV SOLN
250.0000 mL | INTRAVENOUS | Status: DC
  Administered 2022-01-07 – 2022-01-10 (×2): 250 mL via INTRAVENOUS

## 2022-01-06 MED ORDER — PLASMA-LYTE A IV SOLN
INTRAVENOUS | Status: DC | PRN
  Administered 2022-01-06: 300 mL via INTRAVASCULAR

## 2022-01-06 MED ORDER — LACTATED RINGERS IV SOLN: 500.0000 mL | Freq: Once | INTRAVENOUS | Status: DC | PRN

## 2022-01-06 MED ORDER — SODIUM CHLORIDE 0.9 % IV SOLN: INTRAVENOUS | Status: DC

## 2022-01-06 MED ORDER — LACTATED RINGERS IV SOLN: INTRAVENOUS | Status: DC

## 2022-01-06 MED ORDER — TRAMADOL HCL 50 MG PO TABS: 50.0000 mg | ORAL_TABLET | Freq: Two times a day (BID) | ORAL | Status: DC | PRN

## 2022-01-06 MED ORDER — METOPROLOL TARTRATE 25 MG/10 ML ORAL SUSPENSION: 12.5000 mg | Freq: Two times a day (BID) | ORAL | Status: DC

## 2022-01-06 MED ORDER — CEFAZOLIN SODIUM-DEXTROSE 1-4 GM/50ML-% IV SOLN
1.0000 g | INTRAVENOUS | Status: DC
  Administered 2022-01-06: 1 g via INTRAVENOUS
  Filled 2022-01-06: qty 50

## 2022-01-06 MED ORDER — ALBUMIN HUMAN 5 % IV SOLN: INTRAVENOUS | Status: DC | PRN

## 2022-01-06 MED ORDER — PHENYLEPHRINE 40 MCG/ML (10ML) SYRINGE FOR IV PUSH (FOR BLOOD PRESSURE SUPPORT)
PREFILLED_SYRINGE | INTRAVENOUS | Status: AC
  Filled 2022-01-06: qty 10

## 2022-01-06 MED ORDER — ACETAMINOPHEN 500 MG PO TABS: 1000.0000 mg | ORAL_TABLET | Freq: Four times a day (QID) | ORAL | Status: AC

## 2022-01-06 MED ORDER — ROCURONIUM BROMIDE 10 MG/ML (PF) SYRINGE
PREFILLED_SYRINGE | INTRAVENOUS | Status: AC
  Filled 2022-01-06: qty 20

## 2022-01-06 MED ORDER — VASOPRESSIN 20 UNITS/100 ML INFUSION FOR SHOCK: 0.0000 [IU]/min | INTRAVENOUS | Status: DC

## 2022-01-06 MED ORDER — NITROGLYCERIN IN D5W 200-5 MCG/ML-% IV SOLN: 0.0000 ug/min | INTRAVENOUS | Status: DC

## 2022-01-06 MED ORDER — FAMOTIDINE IN NACL 20-0.9 MG/50ML-% IV SOLN
20.0000 mg | Freq: Two times a day (BID) | INTRAVENOUS | Status: AC
  Administered 2022-01-06 – 2022-01-07 (×2): 20 mg via INTRAVENOUS
  Filled 2022-01-06 (×2): qty 50

## 2022-01-06 MED ORDER — EPINEPHRINE HCL 5 MG/250ML IV SOLN IN NS
0.0000 ug/min | INTRAVENOUS | Status: DC
  Administered 2022-01-06: 5 ug/min via INTRAVENOUS
  Administered 2022-01-07 (×2): 10 ug/min via INTRAVENOUS
  Administered 2022-01-07: 5 ug/min via INTRAVENOUS
  Administered 2022-01-08 – 2022-01-09 (×4): 10 ug/min via INTRAVENOUS
  Filled 2022-01-06 (×8): qty 250

## 2022-01-06 MED ORDER — PHENYLEPHRINE HCL-NACL 20-0.9 MG/250ML-% IV SOLN
0.0000 ug/min | INTRAVENOUS | Status: DC
  Filled 2022-01-06: qty 250

## 2022-01-06 MED ORDER — INSULIN REGULAR(HUMAN) IN NACL 100-0.9 UT/100ML-% IV SOLN
INTRAVENOUS | Status: DC
  Administered 2022-01-06: 3.4 [IU]/h via INTRAVENOUS
  Administered 2022-01-07: 8.5 [IU]/h via INTRAVENOUS
  Filled 2022-01-06 (×2): qty 100

## 2022-01-06 MED ORDER — DOCUSATE SODIUM 100 MG PO CAPS: 200.0000 mg | ORAL_CAPSULE | Freq: Every day | ORAL | Status: DC

## 2022-01-06 MED ORDER — COAGULATION FACTOR VIIA RECOMB 1 MG IV SOLR: 90.0000 ug/kg | Freq: Once | INTRAVENOUS | Status: DC

## 2022-01-06 MED ORDER — NOREPINEPHRINE 4 MG/250ML-% IV SOLN
INTRAVENOUS | Status: AC
  Administered 2022-01-06: 4 mg
  Filled 2022-01-06: qty 250

## 2022-01-06 MED ORDER — FENTANYL CITRATE PF 50 MCG/ML IJ SOSY
25.0000 ug | PREFILLED_SYRINGE | INTRAMUSCULAR | Status: DC | PRN
  Administered 2022-01-07 – 2022-01-09 (×15): 50 ug via INTRAVENOUS
  Administered 2022-01-10 (×2): 25 ug via INTRAVENOUS
  Administered 2022-01-11 (×2): 50 ug via INTRAVENOUS
  Administered 2022-01-11 (×2): 25 ug via INTRAVENOUS
  Administered 2022-01-11: 50 ug via INTRAVENOUS
  Filled 2022-01-06 (×23): qty 1

## 2022-01-06 MED ORDER — HEMOSTATIC AGENTS (NO CHARGE) OPTIME
TOPICAL | Status: DC | PRN
Start: 2022-01-06 — End: 2022-01-06
  Administered 2022-01-06: 1 via TOPICAL

## 2022-01-06 MED ORDER — METOPROLOL TARTRATE 12.5 MG HALF TABLET: 12.5000 mg | ORAL_TABLET | Freq: Two times a day (BID) | ORAL | Status: DC

## 2022-01-06 MED ORDER — MIDAZOLAM HCL (PF) 10 MG/2ML IJ SOLN
INTRAMUSCULAR | Status: AC
  Filled 2022-01-06: qty 2

## 2022-01-06 MED ORDER — MORPHINE SULFATE (PF) 2 MG/ML IV SOLN: 1.0000 mg | INTRAVENOUS | Status: DC | PRN

## 2022-01-06 MED ORDER — SODIUM CHLORIDE 0.9% FLUSH: 3.0000 mL | INTRAVENOUS | Status: DC | PRN

## 2022-01-06 MED ORDER — PROPOFOL 10 MG/ML IV BOLUS
INTRAVENOUS | Status: DC | PRN
  Administered 2022-01-06: 40 mg via INTRAVENOUS

## 2022-01-06 MED ORDER — ALBUMIN HUMAN 5 % IV SOLN
250.0000 mL | INTRAVENOUS | Status: AC | PRN
  Administered 2022-01-06 – 2022-01-07 (×4): 12.5 g via INTRAVENOUS

## 2022-01-06 MED ORDER — PROTAMINE SULFATE 10 MG/ML IV SOLN
INTRAVENOUS | Status: DC | PRN
  Administered 2022-01-06: 50 mg via INTRAVENOUS
  Administered 2022-01-06: 230 mg via INTRAVENOUS
  Administered 2022-01-06: 50 mg via INTRAVENOUS

## 2022-01-06 MED ORDER — MAGNESIUM SULFATE 4 GM/100ML IV SOLN: 4.0000 g | Freq: Once | INTRAVENOUS | Status: DC

## 2022-01-06 MED ORDER — 0.9 % SODIUM CHLORIDE (POUR BTL) OPTIME
TOPICAL | Status: DC | PRN
  Administered 2022-01-06: 1000 mL
  Administered 2022-01-06: 5000 mL
  Administered 2022-01-06 (×2): 1000 mL

## 2022-01-06 MED ORDER — METOPROLOL TARTRATE 5 MG/5ML IV SOLN: 2.5000 mg | INTRAVENOUS | Status: DC | PRN

## 2022-01-06 MED ORDER — BISACODYL 5 MG PO TBEC
10.0000 mg | DELAYED_RELEASE_TABLET | Freq: Every day | ORAL | Status: DC
  Administered 2022-01-09 – 2022-01-19 (×6): 10 mg via ORAL
  Filled 2022-01-06 (×7): qty 2

## 2022-01-06 MED ORDER — ONDANSETRON HCL 4 MG/2ML IJ SOLN
4.0000 mg | Freq: Four times a day (QID) | INTRAMUSCULAR | Status: DC | PRN
  Administered 2022-01-07 – 2022-01-31 (×11): 4 mg via INTRAVENOUS
  Filled 2022-01-06 (×11): qty 2

## 2022-01-06 MED ORDER — CALCIUM CHLORIDE 10 % IV SOLN
INTRAVENOUS | Status: DC | PRN
  Administered 2022-01-06 (×2): 200 mg via INTRAVENOUS
  Administered 2022-01-06: 300 mg via INTRAVENOUS
  Administered 2022-01-06 (×2): 200 mg via INTRAVENOUS
  Administered 2022-01-06: 100 mg via INTRAVENOUS
  Administered 2022-01-06: 500 mg via INTRAVENOUS
  Administered 2022-01-06 (×2): 300 mg via INTRAVENOUS
  Administered 2022-01-06: 200 mg via INTRAVENOUS
  Administered 2022-01-06 (×3): 100 mg via INTRAVENOUS
  Administered 2022-01-06: 200 mg via INTRAVENOUS

## 2022-01-06 MED ORDER — OXYCODONE HCL 5 MG PO TABS: 5.0000 mg | ORAL_TABLET | ORAL | Status: DC | PRN

## 2022-01-06 MED ORDER — MILRINONE LACTATE IN DEXTROSE 20-5 MG/100ML-% IV SOLN
0.1250 ug/kg/min | INTRAVENOUS | Status: DC
  Administered 2022-01-06 – 2022-01-08 (×5): 0.3 ug/kg/min via INTRAVENOUS
  Administered 2022-01-09: 0.25 ug/kg/min via INTRAVENOUS
  Administered 2022-01-10 – 2022-01-11 (×3): 0.125 ug/kg/min via INTRAVENOUS
  Filled 2022-01-06 (×7): qty 100

## 2022-01-06 MED ORDER — METHYLPREDNISOLONE SODIUM SUCC 125 MG IJ SOLR
60.0000 mg | Freq: Once | INTRAMUSCULAR | Status: AC
  Administered 2022-01-06: 60 mg via INTRAVENOUS
  Filled 2022-01-06: qty 2

## 2022-01-06 MED ORDER — MIDAZOLAM HCL 2 MG/2ML IJ SOLN
2.0000 mg | INTRAMUSCULAR | Status: DC | PRN
  Administered 2022-01-08 – 2022-01-10 (×5): 2 mg via INTRAVENOUS
  Filled 2022-01-06 (×6): qty 2

## 2022-01-06 MED ORDER — ASPIRIN 81 MG PO CHEW
324.0000 mg | CHEWABLE_TABLET | Freq: Every day | ORAL | Status: DC
  Administered 2022-01-07 – 2022-02-03 (×28): 324 mg
  Filled 2022-01-06 (×29): qty 4

## 2022-01-06 MED ORDER — DOPAMINE-DEXTROSE 3.2-5 MG/ML-% IV SOLN
0.0000 ug/kg/min | INTRAVENOUS | Status: DC
  Filled 2022-01-06: qty 250

## 2022-01-06 MED ORDER — VANCOMYCIN HCL IN DEXTROSE 1-5 GM/200ML-% IV SOLN
1000.0000 mg | Freq: Once | INTRAVENOUS | Status: AC
  Administered 2022-01-06: 1000 mg via INTRAVENOUS
  Filled 2022-01-06: qty 200

## 2022-01-06 MED ORDER — METHYLENE BLUE 0.5 % INJ SOLN
1.0000 mg/kg | Freq: Once | Status: DC
  Filled 2022-01-06: qty 14.9

## 2022-01-06 MED ORDER — NOREPINEPHRINE 4 MG/250ML-% IV SOLN
0.0000 ug/min | INTRAVENOUS | Status: DC
  Administered 2022-01-06: 27 ug/min via INTRAVENOUS
  Filled 2022-01-06: qty 250

## 2022-01-06 MED ORDER — ORAL CARE MOUTH RINSE
15.0000 mL | OROMUCOSAL | Status: DC
  Administered 2022-01-06 – 2022-01-12 (×48): 15 mL via OROMUCOSAL

## 2022-01-06 MED ORDER — CHLORHEXIDINE GLUCONATE CLOTH 2 % EX PADS
6.0000 | MEDICATED_PAD | Freq: Every day | CUTANEOUS | Status: DC
  Administered 2022-01-07 – 2022-01-23 (×18): 6 via TOPICAL

## 2022-01-06 MED ORDER — SODIUM CHLORIDE 0.9% FLUSH
3.0000 mL | Freq: Two times a day (BID) | INTRAVENOUS | Status: DC
  Administered 2022-01-07: 09:00:00 3 mL via INTRAVENOUS
  Administered 2022-01-07: 22:00:00 10 mL via INTRAVENOUS
  Administered 2022-01-08 – 2022-02-04 (×35): 3 mL via INTRAVENOUS

## 2022-01-06 MED ORDER — SODIUM CHLORIDE 0.45 % IV SOLN: INTRAVENOUS | Status: DC | PRN

## 2022-01-06 MED ORDER — MIDAZOLAM HCL (PF) 5 MG/ML IJ SOLN
INTRAMUSCULAR | Status: DC | PRN
  Administered 2022-01-06: 1 mg via INTRAVENOUS
  Administered 2022-01-06: 2 mg via INTRAVENOUS
  Administered 2022-01-06 (×3): 1 mg via INTRAVENOUS
  Administered 2022-01-06: 4 mg via INTRAVENOUS

## 2022-01-06 MED ORDER — COAGULATION FACTOR VIIA RECOMB 5 MG IV SOLR
7.0000 mg | Freq: Once | INTRAVENOUS | Status: DC
  Filled 2022-01-06: qty 5

## 2022-01-06 MED ORDER — ROCURONIUM BROMIDE 10 MG/ML (PF) SYRINGE
PREFILLED_SYRINGE | INTRAVENOUS | Status: DC | PRN
Start: 2022-01-06 — End: 2022-01-06
  Administered 2022-01-06: 20 mg via INTRAVENOUS
  Administered 2022-01-06: 100 mg via INTRAVENOUS
  Administered 2022-01-06 (×2): 20 mg via INTRAVENOUS
  Administered 2022-01-06: 100 mg via INTRAVENOUS
  Administered 2022-01-06: 40 mg via INTRAVENOUS

## 2022-01-06 MED ORDER — SODIUM CHLORIDE (PF) 0.9 % IJ SOLN
OROMUCOSAL | Status: DC | PRN
  Administered 2022-01-06 (×5): 4 mL via TOPICAL

## 2022-01-06 MED ORDER — ASPIRIN EC 325 MG PO TBEC: 325.0000 mg | DELAYED_RELEASE_TABLET | Freq: Every day | ORAL | Status: DC

## 2022-01-06 MED ORDER — HEPARIN SODIUM (PORCINE) 1000 UNIT/ML IJ SOLN
INTRAMUSCULAR | Status: AC
  Filled 2022-01-06: qty 1

## 2022-01-06 MED ORDER — BISACODYL 10 MG RE SUPP
10.0000 mg | Freq: Every day | RECTAL | Status: DC
  Administered 2022-01-07 – 2022-01-08 (×2): 10 mg via RECTAL
  Filled 2022-01-06 (×3): qty 1

## 2022-01-06 MED ORDER — POTASSIUM CHLORIDE 10 MEQ/50ML IV SOLN
10.0000 meq | INTRAVENOUS | Status: AC
  Administered 2022-01-06 (×3): 10 meq via INTRAVENOUS

## 2022-01-06 MED ORDER — SODIUM CHLORIDE 0.9 % IV SOLN
20.0000 ug | INTRAVENOUS | Status: AC
  Administered 2022-01-06: 20 ug via INTRAVENOUS
  Filled 2022-01-06 (×2): qty 5

## 2022-01-06 MED ORDER — PROPOFOL 10 MG/ML IV BOLUS
INTRAVENOUS | Status: AC
  Filled 2022-01-06: qty 20

## 2022-01-06 MED ORDER — NOREPINEPHRINE 16 MG/250ML-% IV SOLN
0.0000 ug/min | INTRAVENOUS | Status: DC
  Administered 2022-01-06 – 2022-01-08 (×6): 40 ug/min via INTRAVENOUS
  Administered 2022-01-08: 09:00:00 55 ug/min via INTRAVENOUS
  Administered 2022-01-09: 21:00:00 24 ug/min via INTRAVENOUS
  Administered 2022-01-09: 34 ug/min via INTRAVENOUS
  Administered 2022-01-09: 11:00:00 29 ug/min via INTRAVENOUS
  Administered 2022-01-10: 12:00:00 13 ug/min via INTRAVENOUS
  Administered 2022-01-13: 16:00:00 26 ug/min via INTRAVENOUS
  Administered 2022-01-13: 05:00:00 13 ug/min via INTRAVENOUS
  Administered 2022-01-14: 16.96 ug/min via INTRAVENOUS
  Administered 2022-01-14: 15:00:00 16 ug/min via INTRAVENOUS
  Administered 2022-01-15: 14:00:00 19 ug/min via INTRAVENOUS
  Administered 2022-01-16: 22:00:00 24 ug/min via INTRAVENOUS
  Administered 2022-01-16: 10:00:00 16 ug/min via INTRAVENOUS
  Administered 2022-01-17: 23:00:00 31 ug/min via INTRAVENOUS
  Administered 2022-01-17: 14:00:00 26 ug/min via INTRAVENOUS
  Administered 2022-01-18: 20 ug/min via INTRAVENOUS
  Administered 2022-01-18: 22 ug/min via INTRAVENOUS
  Administered 2022-01-19: 11 ug/min via INTRAVENOUS
  Administered 2022-01-21: 06:00:00 8 ug/min via INTRAVENOUS
  Administered 2022-01-22 – 2022-01-27 (×2): 4 ug/min via INTRAVENOUS
  Filled 2022-01-06 (×5): qty 250
  Filled 2022-01-06: qty 500
  Filled 2022-01-06 (×11): qty 250
  Filled 2022-01-06: qty 500
  Filled 2022-01-06 (×7): qty 250

## 2022-01-06 MED ORDER — PROTAMINE SULFATE 10 MG/ML IV SOLN
INTRAVENOUS | Status: AC
  Filled 2022-01-06: qty 25

## 2022-01-06 MED ORDER — HEMOSTATIC AGENTS (NO CHARGE) OPTIME
TOPICAL | Status: DC | PRN
  Administered 2022-01-06 (×2): 1 via TOPICAL

## 2022-01-06 MED ORDER — DEXMEDETOMIDINE HCL IN NACL 400 MCG/100ML IV SOLN
0.0000 ug/kg/h | INTRAVENOUS | Status: DC
  Administered 2022-01-06: 0.4 ug/kg/h via INTRAVENOUS
  Administered 2022-01-06 – 2022-01-07 (×3): 0.7 ug/kg/h via INTRAVENOUS
  Administered 2022-01-08: 0.2 ug/kg/h via INTRAVENOUS
  Administered 2022-01-08: 0.7 ug/kg/h via INTRAVENOUS
  Administered 2022-01-10 (×2): 0.2 ug/kg/h via INTRAVENOUS
  Administered 2022-01-11: 0.7 ug/kg/h via INTRAVENOUS
  Filled 2022-01-06 (×10): qty 100

## 2022-01-06 MED ORDER — DEXTROSE 50 % IV SOLN: 0.0000 mL | INTRAVENOUS | Status: DC | PRN

## 2022-01-06 SURGICAL SUPPLY — 134 items
ADAPTER CARDIO PERF ANTE/RETRO (ADAPTER) ×1 IMPLANT
BAG DECANTER FOR FLEXI CONT (MISCELLANEOUS) ×5 IMPLANT
BLADE CLIPPER SURG (BLADE) ×4 IMPLANT
BLADE CORE FAN STRYKER (BLADE) ×1 IMPLANT
BLADE STERNUM SYSTEM 6 (BLADE) ×5 IMPLANT
BLADE SURG 11 STRL SS (BLADE) ×1 IMPLANT
BLADE SURG 15 STRL LF DISP TIS (BLADE) IMPLANT
BLADE SURG 15 STRL SS (BLADE) ×5
BNDG ELASTIC 4X5.8 VLCR STR LF (GAUZE/BANDAGES/DRESSINGS) ×5 IMPLANT
BNDG ELASTIC 6X5.8 VLCR STR LF (GAUZE/BANDAGES/DRESSINGS) ×5 IMPLANT
BNDG GAUZE ELAST 4 BULKY (GAUZE/BANDAGES/DRESSINGS) ×5 IMPLANT
CANISTER SUCT 3000ML PPV (MISCELLANEOUS) ×5 IMPLANT
CANNULA EZ GLIDE AORTIC 21FR (CANNULA) ×5 IMPLANT
CANNULA VRC MALB SNGL STG 28FR (MISCELLANEOUS) IMPLANT
CANNULA VRC MALB SNGL STG 36FR (MISCELLANEOUS) IMPLANT
CATH CPB KIT HENDRICKSON (MISCELLANEOUS) ×5 IMPLANT
CATH ROBINSON RED A/P 18FR (CATHETERS) ×7 IMPLANT
CATH THORACIC 36FR (CATHETERS) ×5 IMPLANT
CATH THORACIC 36FR RT ANG (CATHETERS) ×5 IMPLANT
CLIP TI WIDE RED SMALL 24 (CLIP) ×4 IMPLANT
CLIP VESOCCLUDE MED 24/CT (CLIP) IMPLANT
CLIP VESOCCLUDE SM WIDE 24/CT (CLIP) IMPLANT
CNTNR URN SCR LID CUP LEK RST (MISCELLANEOUS) IMPLANT
CONN ST 1/2X1/2  BEN (MISCELLANEOUS) ×10
CONN ST 1/2X1/2 BEN (MISCELLANEOUS) IMPLANT
CONN ST 3/8 X 1/2 (MISCELLANEOUS) ×1 IMPLANT
CONT SPEC 4OZ STRL OR WHT (MISCELLANEOUS) ×5
CONTAINER PROTECT SURGISLUSH (MISCELLANEOUS) ×11 IMPLANT
COUNTER NEEDLE 20 DBL MAG RED (NEEDLE) ×1 IMPLANT
DERMABOND ADVANCED (GAUZE/BANDAGES/DRESSINGS) ×1
DERMABOND ADVANCED .7 DNX12 (GAUZE/BANDAGES/DRESSINGS) IMPLANT
DRAIN CHANNEL 28F RND 3/8 FF (WOUND CARE) ×1 IMPLANT
DRAIN JACKSON PRATT 10MM FLAT (MISCELLANEOUS) ×1 IMPLANT
DRAPE CARDIOVASCULAR INCISE (DRAPES) ×5
DRAPE SRG 135X102X78XABS (DRAPES) ×4 IMPLANT
DRAPE WARM FLUID 44X44 (DRAPES) ×5 IMPLANT
DRSG COVADERM 4X14 (GAUZE/BANDAGES/DRESSINGS) ×5 IMPLANT
ELECT CAUTERY BLADE 6.4 (BLADE) ×1 IMPLANT
ELECT REM PT RETURN 9FT ADLT (ELECTROSURGICAL) ×10
ELECTRODE REM PT RTRN 9FT ADLT (ELECTROSURGICAL) ×8 IMPLANT
EVACUATOR SILICONE 100CC (DRAIN) ×1 IMPLANT
FELT TEFLON 1X6 (MISCELLANEOUS) ×12 IMPLANT
GAUZE 4X4 16PLY ~~LOC~~+RFID DBL (SPONGE) ×5 IMPLANT
GAUZE SPONGE 4X4 12PLY STRL (GAUZE/BANDAGES/DRESSINGS) ×10 IMPLANT
GAUZE SPONGE 4X4 12PLY STRL LF (GAUZE/BANDAGES/DRESSINGS) ×2 IMPLANT
GLOVE SURG GAMMEX LF SZ7 (GLOVE) ×9 IMPLANT
GLOVE SURG MICRO LTX SZ6 (GLOVE) ×2 IMPLANT
GLOVE SURG MICRO LTX SZ6.5 (GLOVE) ×1 IMPLANT
GLOVE SURG MICRO LTX SZ7 (GLOVE) ×1 IMPLANT
GLOVE SURG MICRO LTX SZ7.5 (GLOVE) ×6 IMPLANT
GLOVE SURG SIGNA 7.5 PF LTX (GLOVE) ×15 IMPLANT
GLOVE SURG UNDER LTX SZ8 (GLOVE) ×1 IMPLANT
GLOVE SURG UNDER POLY LF SZ7.5 (GLOVE) ×1 IMPLANT
GOWN STRL REUS W/ TWL LRG LVL3 (GOWN DISPOSABLE) ×16 IMPLANT
GOWN STRL REUS W/ TWL XL LVL3 (GOWN DISPOSABLE) ×8 IMPLANT
GOWN STRL REUS W/TWL LRG LVL3 (GOWN DISPOSABLE) ×10
GOWN STRL REUS W/TWL XL LVL3 (GOWN DISPOSABLE) ×25
HEMOSTAT POWDER SURGIFOAM 1G (HEMOSTASIS) ×17 IMPLANT
HEMOSTAT SURGICEL 2X14 (HEMOSTASIS) ×5 IMPLANT
INSERT FOGARTY XLG (MISCELLANEOUS) IMPLANT
IV ADAPTER SYR DOUBLE MALE LL (MISCELLANEOUS) ×1 IMPLANT
IV CATH 22GX1 FEP (IV SOLUTION) ×1 IMPLANT
KIT BASIN OR (CUSTOM PROCEDURE TRAY) ×5 IMPLANT
KIT SUCTION CATH 14FR (SUCTIONS) ×11 IMPLANT
KIT TURNOVER KIT B (KITS) ×5 IMPLANT
KIT VASOVIEW HEMOPRO 2 VH 4000 (KITS) ×5 IMPLANT
LOOP VESSEL SUPERMAXI WHITE (MISCELLANEOUS) ×1 IMPLANT
MARKER GRAFT CORONARY BYPASS (MISCELLANEOUS) ×15 IMPLANT
NS IRRIG 1000ML POUR BTL (IV SOLUTION) ×28 IMPLANT
PACK E OPEN HEART (SUTURE) ×5 IMPLANT
PACK OPEN HEART (CUSTOM PROCEDURE TRAY) ×5 IMPLANT
PAD ARMBOARD 7.5X6 YLW CONV (MISCELLANEOUS) ×10 IMPLANT
PAD ELECT DEFIB RADIOL ZOLL (MISCELLANEOUS) ×5 IMPLANT
PENCIL BUTTON HOLSTER BLD 10FT (ELECTRODE) ×5 IMPLANT
POSITIONER HEAD DONUT 9IN (MISCELLANEOUS) ×5 IMPLANT
PUNCH AORTIC ROTATE 4.0MM (MISCELLANEOUS) ×1 IMPLANT
PUNCH AORTIC ROTATE 4.5MM 8IN (MISCELLANEOUS) IMPLANT
PUNCH AORTIC ROTATE 5MM 8IN (MISCELLANEOUS) IMPLANT
RING TRICUSPID T36 (Prosthesis & Implant Heart) ×1 IMPLANT
SEALANT PATCH FIBRIN 2X4IN (MISCELLANEOUS) ×2 IMPLANT
SET MPS 3-ND DEL (MISCELLANEOUS) ×1 IMPLANT
SOL ANTI FOG 6CC (MISCELLANEOUS) IMPLANT
SOLUTION ANTI FOG 6CC (MISCELLANEOUS) ×1
SPONGE T-LAP 18X18 ~~LOC~~+RFID (SPONGE) ×24 IMPLANT
SPONGE T-LAP 4X18 ~~LOC~~+RFID (SPONGE) ×7 IMPLANT
SUPPORT HEART JANKE-BARRON (MISCELLANEOUS) ×5 IMPLANT
SUT BONE WAX W31G (SUTURE) ×5 IMPLANT
SUT EB EXC GRN/WHT 2-0 D/A SH (SUTURE) ×5
SUT ETHIBOND (SUTURE) ×2 IMPLANT
SUT ETHIBOND 2 0 SH (SUTURE) ×10
SUT ETHIBOND 2 0 SH 36X2 (SUTURE) IMPLANT
SUT ETHIBOND 2-0 RB-1 WHT (SUTURE) ×2 IMPLANT
SUT ETHIBOND 4 0 RB 1 (SUTURE) ×2 IMPLANT
SUT ETHIBOND NAB MH 2-0 36IN (SUTURE) ×3 IMPLANT
SUT ETHILON 3 0 FSL (SUTURE) ×1 IMPLANT
SUT MNCRL AB 4-0 PS2 18 (SUTURE) ×1 IMPLANT
SUT PDS AB 1 CTX 36 (SUTURE) ×1 IMPLANT
SUT PROLENE 3 0 SH DA (SUTURE) ×6 IMPLANT
SUT PROLENE 4 0 RB 1 (SUTURE) ×55
SUT PROLENE 4 0 SH DA (SUTURE) ×12 IMPLANT
SUT PROLENE 4-0 RB1 .5 CRCL 36 (SUTURE) IMPLANT
SUT PROLENE 5 0 C 1 36 (SUTURE) ×7 IMPLANT
SUT PROLENE 6 0 C 1 30 (SUTURE) ×11 IMPLANT
SUT PROLENE 7 0 BV1 MDA (SUTURE) ×8 IMPLANT
SUT PROLENE 8 0 BV175 6 (SUTURE) IMPLANT
SUT SILK  1 MH (SUTURE) ×10
SUT SILK 1 MH (SUTURE) IMPLANT
SUT SILK 2 0 SH CR/8 (SUTURE) ×2 IMPLANT
SUT STEEL 6MS V (SUTURE) ×5 IMPLANT
SUT STEEL STERNAL CCS#1 18IN (SUTURE) IMPLANT
SUT STEEL SZ 6 DBL 3X14 BALL (SUTURE) ×5 IMPLANT
SUT VIC AB 1 CTX 36 (SUTURE) ×20
SUT VIC AB 1 CTX36XBRD ANBCTR (SUTURE) ×8 IMPLANT
SUT VIC AB 2-0 CT1 27 (SUTURE) ×5
SUT VIC AB 2-0 CT1 TAPERPNT 27 (SUTURE) IMPLANT
SUT VIC AB 2-0 CTX 27 (SUTURE) IMPLANT
SUT VIC AB 3-0 SH 27 (SUTURE) ×5
SUT VIC AB 3-0 SH 27X BRD (SUTURE) IMPLANT
SUT VIC AB 3-0 X1 27 (SUTURE) IMPLANT
SUT VICRYL 4-0 PS2 18IN ABS (SUTURE) IMPLANT
SUTURE EB EXC GRN/WHT 2-0 D/A (SUTURE) IMPLANT
SYSTEM SAHARA CHEST DRAIN ATS (WOUND CARE) ×5 IMPLANT
TAPE CLOTH SURG 4X10 WHT LF (GAUZE/BANDAGES/DRESSINGS) ×2 IMPLANT
TOWEL GREEN STERILE (TOWEL DISPOSABLE) ×5 IMPLANT
TOWEL GREEN STERILE FF (TOWEL DISPOSABLE) ×6 IMPLANT
TRAY CATH LUMEN 1 20CM STRL (SET/KITS/TRAYS/PACK) ×3 IMPLANT
TRAY FOLEY SLVR 16FR TEMP STAT (SET/KITS/TRAYS/PACK) ×5 IMPLANT
TUBE CONNECTING 12X1/4 (SUCTIONS) ×1 IMPLANT
TUBING ART PRESS 12 MALE/MALE (MISCELLANEOUS) ×2 IMPLANT
TUBING LAP HI FLOW INSUFFLATIO (TUBING) ×5 IMPLANT
UNDERPAD 30X36 HEAVY ABSORB (UNDERPADS AND DIAPERS) ×5 IMPLANT
VRC MALLEABLE SINGLE STG 28FR (MISCELLANEOUS) ×5
VRC MALLEABLE SINGLE STG 36FR (MISCELLANEOUS) ×5
WATER STERILE IRR 1000ML POUR (IV SOLUTION) ×10 IMPLANT

## 2022-01-06 NOTE — Brief Op Note (Addendum)
01/24/2022 - 01/17/2022  5:34 PM  PATIENT:  Jody Nguyen  66 y.o. female  PRE-OPERATIVE DIAGNOSIS:  3 vessel CAD, severe tricuspid regurgitation  POST-OPERATIVE DIAGNOSIS:  3 vessel CAD, severe tricuspid regurgitation, patent foramen ovale  PROCEDURE:   Median sternotomy, extracorporeal circulation  CORONARY ARTERY BYPASS GRAFTING x 3 -LIMA to LAD -SVG to DIAGONAL -SVG to PDA  TRICUSPID VALVE ANNULOPLASTY -36 mm Edwards MC3 Ring  REPAIR OF PATENT FORAMEN OVALE (N/A)  ENDOSCOPIC HARVEST GREATER SAPHENOUS VEIN -Right Leg (60 min/ 25 min)  TRANSESOPHAGEAL ECHOCARDIOGRAM (TEE) (N/A)  APPLICATION OF CELL SAVER (N/A)  SURGEON:  Surgeon(s) and Role:    * [name], MD - Primary  PHYSICIAN ASSISTANT: Ellwood Handler PA-C  ASSISTANTS: Lurlean Nanny RNFA   ANESTHESIA:   general  EBL:  6459 mL per records doubtful accuracy, majority reinfused with bypass/ cell saver  BLOOD ADMINISTERED:6 units PRBC,  CELLSAVER, 2 FFP, and 2 PLTS  DRAINS:  left pleural chest tube, mediastinal chest tubes    LOCAL MEDICATIONS USED:  NONE  SPECIMEN:  No Specimen  DISPOSITION OF SPECIMEN:  N/A  COUNTS:  YES  TOURNIQUET:  * No tourniquets in log *  DICTATION: .Dragon Dictation  PLAN OF CARE: Admit to inpatient   PATIENT DISPOSITION:  ICU - intubated and hemodynamically stable.   Delay start of Pharmacological VTE agent (>24hrs) due to surgical blood loss or risk of bleeding: yes

## 2022-01-06 NOTE — Progress Notes (Signed)
PHARMACY NOTE:  ANTIMICROBIAL RENAL DOSAGE ADJUSTMENT  Current antimicrobial regimen includes a mismatch between antimicrobial dosage and estimated renal function.  As per policy approved by the Pharmacy & Therapeutics and Medical Executive Committees, the antimicrobial dosage will be adjusted accordingly.  Current antimicrobial dosage:  Cefazolin 2g IV every 8 hours  Indication: Surgical Prophylaxis   Renal Function:  Estimated Creatinine Clearance: 11.2 mL/min (A) (by C-G formula based on SCr of 4.4 mg/dL (H)). [x]      On intermittent HD, scheduled: []      On CRRT    Antimicrobial dosage has been changed to:  Cefazolin 1g IV every 24 hours for 2 doses (48 hours)  Additional comments:   Thank you for allowing pharmacy to be a part of this patient's care.  Antonietta Jewel, PharmD, [place] Clinical Pharmacist  Phone: [phone] 01/04/2022 6:06 PM  Please check AMION for all [place] phone numbers After 10:00 PM, call [place] [phone]

## 2022-01-06 NOTE — Anesthesia Procedure Notes (Signed)
Central Venous Catheter Insertion Performed by: Santa Lighter, MD, anesthesiologist Start/End02/16/2023 7:10 AM, 01/02/2022 7:20 AM Patient location: Pre-op. Preanesthetic checklist: patient identified, IV checked, site marked, risks and benefits discussed, surgical consent, monitors and equipment checked, pre-op evaluation, timeout performed and anesthesia consent Position: Trendelenburg Lidocaine 1% used for infiltration and patient sedated Hand hygiene performed , maximum sterile barriers used  and Seldinger technique used Catheter size: 9 Fr Central line was placed.MAC introducer Procedure performed using ultrasound guided technique. Ultrasound Notes:anatomy identified, needle tip was noted to be adjacent to the nerve/plexus identified, no ultrasound evidence of intravascular and/or intraneural injection and image(s) printed for medical record Attempts: 1 Following insertion, line sutured, dressing applied and Biopatch. Post procedure assessment: free fluid flow, blood return through all ports and no air  Patient tolerated the procedure well with no immediate complications. Additional procedure comments: Attempt x1 on right IJ, unable to pass wire past 15cm.  Attempt x1 on left with success.Marland Kitchen

## 2022-01-06 NOTE — Anesthesia Procedure Notes (Signed)
Central Venous Catheter Insertion Performed by: Santa Lighter, MD, anesthesiologist Start/End02/23/2023 7:20 AM, 01/13/2022 7:25 AM Patient location: Pre-op. Preanesthetic checklist: patient identified, IV checked, site marked, risks and benefits discussed, surgical consent, monitors and equipment checked, pre-op evaluation and timeout performed Position: Trendelenburg Hand hygiene performed  and maximum sterile barriers used  Total catheter length 100. PA cath was placed.Swan type:thermodilution Procedure performed without using ultrasound guided technique. Attempts: 1 Patient tolerated the procedure well with no immediate complications. Additional procedure comments: Unable to pass PAC into pulmonary artery.  Left at 30cm.  Will re-attempt in OR.Marland Kitchen

## 2022-01-06 NOTE — Anesthesia Procedure Notes (Signed)
Procedure Name: Intubation Date/Time: 01/18/2022 8:03 AM Performed by: Mariea Clonts, CRNA Pre-anesthesia Checklist: Patient identified, Emergency Drugs available, Suction available and Patient being monitored Patient Re-evaluated:Patient Re-evaluated prior to induction Oxygen Delivery Method: Circle System Utilized Preoxygenation: Pre-oxygenation with 100% oxygen Induction Type: IV induction Ventilation: Oral airway inserted - appropriate to patient size and Two handed mask ventilation required Laryngoscope Size: Miller and 2 Grade View: Grade II Tube type: Oral Tube size: 7.5 mm Number of attempts: 1 Airway Equipment and Method: Stylet and Oral airway Placement Confirmation: ETT inserted through vocal cords under direct vision, positive ETCO2 and breath sounds checked- equal and bilateral Tube secured with: Tape Dental Injury: Teeth and Oropharynx as per pre-operative assessment

## 2022-01-06 NOTE — Progress Notes (Signed)
[facility] Progress Note  Name: Jody Nguyen MRN: [id number] DOB: Jun 24, 1956   Subjective:  Seen after returning to [place] from CABG.  She got 6 units PRBC's and 2 FFP units per CTsurgery.  She had coagulapathy.  FiO2 60 and PEEP 5.  She is on levo at 27 mcg/min, epi, and vasopressin.   Last HD on 2/9 with 2 kg UF.   Review of systems:  Unable to obtain secondary to intubated and sedated    Intake/Output Summary (Last 24 hours) at 01/15/2022 1801 Last data filed at 01/11/2022 1700 Gross per 24 hour  Intake 6066.16 ml  Output 8470 ml  Net -2403.84 ml    Vitals:  Vitals:   01/05/22 2035 01/05/22 2131 01/16/2022 0342 01/08/2022 0506  BP: (!) 103/58 (!) 91/55 96/66 102/65  Pulse: 97 94 94 89  Resp: 15 18 19 14   Temp: 98 F (36.7 C) 98 F (36.7 C) 98.1 F (36.7 C) 98.2 F (36.8 C)  TempSrc: Oral Oral Oral Oral  SpO2: 98% 96% 100% 97%  Weight:      Height:         Physical Exam:  General adult female in bed intubated critically ill  HEENT normocephalic atraumatic; lips are markedly swollen  Neck supple trachea midline Lungs coarse mechanical breaths sounds; FIO2 60 and PEEP 5 Heart S1S2 tachycardic at 122  Abdomen soft obese habitus; sedation running limits assessment for tenderness Extremities 1+ edema; right leg is wrapped with drain  Neuro - sedation currently running  Access: right arm AVF in place - I am not able to appreciate a bruit or thrill   Medications reviewed   Labs:  BMP Latest Ref Rng & Units 01/10/2022 01/04/2022 01/21/2022  Glucose 70 - 99 mg/dL - - 153(H)  BUN 8 - 23 mg/dL - - 16  Creatinine 0.44 - 1.00 mg/dL - - 4.40(H)  BUN/Creat Ratio 6 - 22 (calc) - - -  Sodium 135 - 145 mmol/L 144 144 137  Potassium 3.5 - 5.1 mmol/L 3.2(L) 3.6 4.0  Chloride 98 - 111 mmol/L - - 103  CO2 22 - 32 mmol/L - - -  Calcium 8.9 - 10.3 mg/dL - - -   Dialysis Orders: MWF at [facility] 3:15hr, 450/A1.5, EDW 75.5kg, 2K/2Ca, UFP #2, AVF, heparin 2500 unit bolus -  Hectoral 88mcg IV q HD - Sensipar 90mg  PO q HD - Mircera 14mcg IV q 2 weeks (ordered, not yet given. Last Mircera 100 on 12/07/21)   Assessment/Plan:   CAD s/p CABG:  per CT surgery   ESRD:  normally MWF schedule.  She is hypotensive on pressors and post-CABG. I am not able to appreciate a bruit. Anticipate that she will need CRRT tomorrow and we will plan for the same after AM reassessment.  Discussed with CT surgery and pulm Infiltrated AVF - AVF infiltrated on Tuesday when patient moved her arm to move the TV, largely swollen and nurse unable to re cannulate.  Expert cannulator able to stick yesterday and completed full treatment.  Non tender and swollen yesterday. Today swelling increasing throughout the day and tenderness.  [place] of AVF - aneurysmal dilatation noted .    Hypertension/volume: hx of HTN now with multiple pressors post-operatively - per critical care and CTS  Anemia CKD : s/p multiple units of PRBC's   Metabolic bone disease: home binders when taking PO, VDRA, sensipar.  Pulm HTN:  Followed at [facility], on Revatio and opsumit  T2DM CAD/MVR 2016 - severe  multivessel disease and severe TR noted on LHC.  s/p CABG above  Claudia Desanctis, MD 01/15/2022 6:14 PM

## 2022-01-06 NOTE — Progress Notes (Signed)
°  Echocardiogram Echocardiogram Transesophageal has been performed.  Marybelle Killings 01/13/2022, 12:02 PM

## 2022-01-06 NOTE — Interval H&P Note (Signed)
History and Physical Interval Note:  01/09/2022 7:10 AM  Jody Nguyen  has presented today for surgery, with the diagnosis of CAD TR.  The various methods of treatment have been discussed with the patient and family. After consideration of risks, benefits and other options for treatment, the patient has consented to  Procedure(s): CORONARY ARTERY BYPASS GRAFTING (CABG) (N/A) possible TRICUSPID VALVE REPAIR (N/A) TRANSESOPHAGEAL ECHOCARDIOGRAM (TEE) (N/A) as a surgical intervention.  The patient's history has been reviewed, patient examined, no change in status, stable for surgery.  I have reviewed the patient's chart and labs.  Questions were answered to the patient's satisfaction.     [name]

## 2022-01-06 NOTE — Progress Notes (Signed)
Patient ID: Jody Nguyen, female   DOB: 1956-07-31, 66 y.o.   MRN: [id number]  TCTS Evening Rounds:   Milrinone 0.3, epi 5, NE 27, vaso 0.03  MAP 60's CI = 2.5 by Flow Track.  PA catheter could not be floated to PA so it was removed.  CVP 34 if accurate. Rhythm is sinus tachy 120  Sedated on vent.  Dialysis pt.  CT output low  CBC    Component Value Date/Time   WBC 11.6 (H) 01/19/2022 1730   RBC 3.88 01/01/2022 1730   HGB 11.0 (L) 01/16/2022 1730   HGB 12.1 03/03/2013 0955   HCT 33.9 (L) 01/21/2022 1730   HCT 37.4 03/03/2013 0955   PLT 105 (L) 01/02/2022 1730   PLT 288 03/03/2013 0955   MCV 87.4 01/08/2022 1730   MCV 77.9 (L) 03/03/2013 0955   MCH 28.4 12/29/2021 1730   MCHC 32.4 01/13/2022 1730   RDW 18.3 (H) 01/01/2022 1730   RDW 16.1 (H) 03/03/2013 0955   LYMPHSABS 1.0 01/10/2022 0314   LYMPHSABS 1.8 03/03/2013 0955   MONOABS 0.6 01/16/2022 0314   MONOABS 0.4 03/03/2013 0955   EOSABS 0.2 01/10/2022 0314   EOSABS 0.3 03/03/2013 0955   BASOSABS 0.0 12/29/2021 0314   BASOSABS 0.0 03/03/2013 0955     BMET    Component Value Date/Time   NA 144 01/07/2022 1547   NA 140 03/03/2013 0955   K 3.2 (L) 01/14/2022 1547   K 3.8 03/03/2013 0955   CL 103 01/18/2022 1516   CL 99 03/03/2013 0955   CO2 24 01/13/2022 0314   CO2 29 03/03/2013 0955   GLUCOSE 153 (H) 01/14/2022 1516   GLUCOSE 210 (H) 03/03/2013 0955   BUN 16 01/04/2022 1516   BUN 28.1 (H) 03/03/2013 0955   CREATININE 4.40 (H) 01/15/2022 1516   CREATININE 7.21 (H) 12/08/2021 1110   CREATININE 1.9 (H) 03/03/2013 0955   CALCIUM 8.7 (L) 01/02/2022 0314   CALCIUM 9.9 03/03/2013 0955   EGFR 6 (L) 12/08/2021 1110   GFRNONAA 8 (L) 01/19/2022 0314   GFRNONAA 5 (L) 04/21/2021 1614     A/P:  High pressor support postop for vasoplegia but RV and LV function apparently looked ok by bedside echo. 3+ TR.

## 2022-01-06 NOTE — Transfer of Care (Signed)
Immediate Anesthesia Transfer of Care Note  Patient: Jody Nguyen  Procedure(s) Performed: CORONARY ARTERY BYPASS GRAFTING (CABG) TIMES THREE ON CARDIOPULMONARY BYPASS. LIMA TO LAD, SVG TO PD, SVG TO DIAG (Chest) TRICUSPID VALVE REPAIR USING AN 36MM EDWARDS MC3 TRICUSPID ANNULOPLASTY RING (Chest) TRANSESOPHAGEAL ECHOCARDIOGRAM (TEE) (Esophagus) APPLICATION OF CELL SAVER (Chest) ENDOVEIN HARVEST OF GREATER SAPHENOUS VEIN (Right: Leg Upper) REPAIR OF PATENT FORAMEN OVALE  Patient Location: ICU  Anesthesia Type:General  Level of Consciousness: drowsy, patient cooperative and Patient remains intubated per anesthesia plan  Airway & Oxygen Therapy: Patient remains intubated per anesthesia plan and Patient placed on Ventilator (see vital sign flow sheet for setting)  Post-op Assessment: Report given to RN and Post -op Vital signs reviewed and stable  Post vital signs: Reviewed and stable  Last Vitals:  Vitals Value Taken Time  BP    Temp    Pulse    Resp    SpO2      Last Pain:  Vitals:   01/13/2022 0506  TempSrc: Oral  PainSc:       Patients Stated Pain Goal: 0 (01/65/80 0634)  Complications: No notable events documented.

## 2022-01-06 NOTE — Progress Notes (Signed)
NAME:  Jody Nguyen, MRN:  [id number], DOB:  12-11-1955, LOS: 5 ADMISSION DATE:  01/08/2022, CONSULTATION DATE:  12/30/2021 REFERRING MD:  Martie Lee, TRH, CHIEF COMPLAINT:  SOB   History of Present Illness:  66 yo F PMH ESRD, non-obstructive CAD, suspected pulmonary hypertension, Hx PE on eliquis who presented to [facility] ED 2/4 with chest pain, which radiates to arm. Similar sx had also promoted ED presentation on 2/1 at which time trops were 100. She was seen by cardiology outpt on 2/2 and chest pain was felt to be related to volume shift as pain had occurred during dialysis.   On 2/4 presentation, Pt states that episodic chest pain has been intermittent for the past week. Non-exertional, lasts approx 10 minutes. Associated nausea and SOB. Non-specific EKG changes  & was d/w cardiology, ruled out for STEMI. Trop 200. Placed on [place] for SpO2 80s.Admitted to [facility] for obvs with concern for PE and started on heparin gtt. Awaiting VQ scan. ECHO has been performed which reveals Severe RV RA enlargement, severely reduced RV function and septal bowing, felt consistent with chronic process such as severe pulmonary HTN.    PCCM has been consulted in this setting   Pertinent  Medical History  ESRD PE Pulmonary HTN  CAD CVA HLD HTN S/p MV repair  Pericardial effusion r/t minoxidil  Diastolic HF DM2  Significant Hospital Events: Including procedures, antibiotic start and stop dates in addition to other pertinent events   2/4 ED presentation with recurring chest pain -- STEMI ruled out. Stable, mildly hypoxic and placed on [place] 2/5 admit to [facility] for obvs. Concern for PE, ECHO performed by cardiology reportedly shows RV failure, but felt more consistent with chronic process like severe pulmHTN. Awaiting VQ scan. Pulm consulted  2/7 cardiac cath. 3 vessel dz. CABG rec 2/10 CABG, TVR  Interim History / Subjective:    Objective   Blood pressure 102/65, pulse 89, temperature 98.2 F (36.8 C), temperature  source Oral, resp. rate 14, height 4' 11.5" (1.511 m), weight 74.4 kg, SpO2 97 %.        Intake/Output Summary (Last 24 hours) at 01/05/2022 1804 Last data filed at 01/24/2022 1700 Gross per 24 hour  Intake 6066.16 ml  Output 8470 ml  Net -2403.84 ml    Filed Weights   01/05/22 0612 01/05/22 1722 01/05/22 2015  Weight: 77.5 kg 76.7 kg 74.4 kg    Examination: General: overweight middle aged female HEENT: [place]/AT, PERRL, perioral edema.  Neuro: Sedated CV: regular, tachy.  PULM:  coarse vent assisted breaths.  GI: soft, non-distended Extremities: warm/dry, trace edema Skin: no rashes or lesions  Resolved Hospital Problem list     Assessment & Plan:   Coronary artery disease: 3 vessel disease diagnosed on cardiac cath 2/7. S/p CABG 2/10. - management per cardiology, CVTS  Cardiogenic shock: in the post cardiac surgery setting.  - MAP goal 65 - Continue epinephrine, milrinone, vasopressin, norepi, phenylephrine - Trend hemodynamic indices.  - per CVTS  Acute respiratory failure with hypoxia  Pulmonary HTN  P: Full vent support for tonight Will evaluate for extubation in the morning PAD protocol for RASS goal -1 to -2.  On revatio and opsumit asoutpatient  Acute on chronic systolic/diastolic CHF Tricuspid regurgitation, severe: now status post tricuspid valve repair S/p MVR 2016 Prolonged qtc - per cardiology/CVTS  ESRD Hypokalemia Hypomagnesemia - nephrology following - Will likely need CRRT tomorrow morning - replace electrolytes as indicated  HLD - per cardiology  DM2 -  CBG monitoring and SSI  Hx PE on chronic anticoagulation- - resume AC with when cleared by surgery.    Best Practice (right click and "Reselect all SmartList Selections" daily)  Per primary   Critical care time: NA    Georgann Housekeeper, AGACNP-BC [place] for personal pager PCCM on call pager [phone] until 7pm. Please call Elink 7p-7a.  [phone]  01/18/2022 6:30 PM

## 2022-01-06 NOTE — Plan of Care (Signed)
°  Problem: Cardiac: Goal: Will achieve and/or maintain hemodynamic stability Outcome: Not Progressing Note: Pt without bleeding, requiring significant vasopressor/inotrope support   Problem: Clinical Measurements: Goal: Postoperative complications will be avoided or minimized Outcome: Not Progressing   Problem: Respiratory: Goal: Respiratory status will improve Outcome: Progressing   Problem: Skin Integrity: Goal: Wound healing without signs and symptoms of infection Outcome: Progressing Goal: Risk for impaired skin integrity will decrease Outcome: Progressing   Problem: Urinary Elimination: Goal: Ability to achieve and maintain adequate renal perfusion and functioning will improve Outcome: Not Applicable

## 2022-01-06 NOTE — Progress Notes (Signed)
Patient was taken to the OR for CABG today early this AM and remains in the OR currently and was not examined. Appreciate Care per TCTS and TRH will follow as needed. Please do not hesitate to reach out through secure chat for any questions. Vascular, Nephro, and Cardiology all following and appreciate care for this patient.

## 2022-01-07 ENCOUNTER — Inpatient Hospital Stay (HOSPITAL_COMMUNITY): Payer: Medicare Other

## 2022-01-07 DIAGNOSIS — Z992 Dependence on renal dialysis: Secondary | ICD-10-CM | POA: Diagnosis not present

## 2022-01-07 DIAGNOSIS — N186 End stage renal disease: Secondary | ICD-10-CM | POA: Diagnosis not present

## 2022-01-07 LAB — CBC
HCT: 34.6 % — ABNORMAL LOW (ref 36.0–46.0)
HCT: 32.1 % — ABNORMAL LOW (ref 36.0–46.0)
Hemoglobin: 11 g/dL — ABNORMAL LOW (ref 12.0–15.0)
Hemoglobin: 10.9 g/dL — ABNORMAL LOW (ref 12.0–15.0)
MCH: 28.8 pg (ref 26.0–34.0)
MCH: 29.1 pg (ref 26.0–34.0)
MCHC: 31.8 g/dL (ref 30.0–36.0)
MCHC: 34 g/dL (ref 30.0–36.0)
MCV: 90.6 fL (ref 80.0–100.0)
MCV: 85.8 fL (ref 80.0–100.0)
Platelets: 148 10*3/uL — ABNORMAL LOW (ref 150–400)
Platelets: 123 10*3/uL — ABNORMAL LOW (ref 150–400)
RBC: 3.82 MIL/uL — ABNORMAL LOW (ref 3.87–5.11)
RBC: 3.74 MIL/uL — ABNORMAL LOW (ref 3.87–5.11)
RDW: 19.6 % — ABNORMAL HIGH (ref 11.5–15.5)
RDW: 19.6 % — ABNORMAL HIGH (ref 11.5–15.5)
WBC: 16.3 10*3/uL — ABNORMAL HIGH (ref 4.0–10.5)
WBC: 17.3 10*3/uL — ABNORMAL HIGH (ref 4.0–10.5)
nRBC: 2.8 % — ABNORMAL HIGH (ref 0.0–0.2)
nRBC: 1.8 % — ABNORMAL HIGH (ref 0.0–0.2)

## 2022-01-07 LAB — GLUCOSE, CAPILLARY
Glucose-Capillary: 111 mg/dL — ABNORMAL HIGH (ref 70–99)
Glucose-Capillary: 112 mg/dL — ABNORMAL HIGH (ref 70–99)
Glucose-Capillary: 115 mg/dL — ABNORMAL HIGH (ref 70–99)
Glucose-Capillary: 124 mg/dL — ABNORMAL HIGH (ref 70–99)
Glucose-Capillary: 124 mg/dL — ABNORMAL HIGH (ref 70–99)
Glucose-Capillary: 129 mg/dL — ABNORMAL HIGH (ref 70–99)
Glucose-Capillary: 131 mg/dL — ABNORMAL HIGH (ref 70–99)
Glucose-Capillary: 140 mg/dL — ABNORMAL HIGH (ref 70–99)
Glucose-Capillary: 151 mg/dL — ABNORMAL HIGH (ref 70–99)
Glucose-Capillary: 172 mg/dL — ABNORMAL HIGH (ref 70–99)
Glucose-Capillary: 194 mg/dL — ABNORMAL HIGH (ref 70–99)
Glucose-Capillary: 92 mg/dL (ref 70–99)
Glucose-Capillary: 97 mg/dL (ref 70–99)

## 2022-01-07 LAB — RENAL FUNCTION PANEL
Albumin: 3.4 g/dL — ABNORMAL LOW (ref 3.5–5.0)
Anion gap: 12 (ref 5–15)
BUN: 19 mg/dL (ref 8–23)
CO2: 21 mmol/L — ABNORMAL LOW (ref 22–32)
Calcium: 8.4 mg/dL — ABNORMAL LOW (ref 8.9–10.3)
Chloride: 106 mmol/L (ref 98–111)
Creatinine, Ser: 4.26 mg/dL — ABNORMAL HIGH (ref 0.44–1.00)
GFR, Estimated: 11 mL/min — ABNORMAL LOW (ref >60)
Glucose, Bld: 97 mg/dL (ref 70–99)
Phosphorus: 4.5 mg/dL (ref 2.5–4.6)
Potassium: 4 mmol/L (ref 3.5–5.1)
Sodium: 139 mmol/L (ref 135–145)

## 2022-01-07 LAB — BPAM FFP
Blood Product Expiration Date: 202302152359
Blood Product Expiration Date: 202302152359
ISSUE DATE / TIME: 202302101513
ISSUE DATE / TIME: 202302101513
Unit Type and Rh: 6200
Unit Type and Rh: 6200

## 2022-01-07 LAB — POCT I-STAT 7, (LYTES, BLD GAS, ICA,H+H)
Acid-base deficit: 6 mmol/L — ABNORMAL HIGH (ref 0.0–2.0)
Acid-base deficit: 10 mmol/L — ABNORMAL HIGH (ref 0.0–2.0)
Acid-base deficit: 8 mmol/L — ABNORMAL HIGH (ref 0.0–2.0)
Bicarbonate: 21.1 mmol/L (ref 20.0–28.0)
Bicarbonate: 19.3 mmol/L — ABNORMAL LOW (ref 20.0–28.0)
Bicarbonate: 19.7 mmol/L — ABNORMAL LOW (ref 20.0–28.0)
Calcium, Ion: 1.24 mmol/L (ref 1.15–1.40)
Calcium, Ion: 1.26 mmol/L (ref 1.15–1.40)
Calcium, Ion: 1.22 mmol/L (ref 1.15–1.40)
HCT: 32 % — ABNORMAL LOW (ref 36.0–46.0)
HCT: 35 % — ABNORMAL LOW (ref 36.0–46.0)
HCT: 33 % — ABNORMAL LOW (ref 36.0–46.0)
Hemoglobin: 10.9 g/dL — ABNORMAL LOW (ref 12.0–15.0)
Hemoglobin: 11.9 g/dL — ABNORMAL LOW (ref 12.0–15.0)
Hemoglobin: 11.2 g/dL — ABNORMAL LOW (ref 12.0–15.0)
O2 Saturation: 81 %
O2 Saturation: 78 %
O2 Saturation: 89 %
Patient temperature: 35.8
Patient temperature: 36.5
Patient temperature: 36.7
Potassium: 2.9 mmol/L — ABNORMAL LOW (ref 3.5–5.1)
Potassium: 3.6 mmol/L (ref 3.5–5.1)
Potassium: 3.6 mmol/L (ref 3.5–5.1)
Sodium: 142 mmol/L (ref 135–145)
Sodium: 140 mmol/L (ref 135–145)
Sodium: 141 mmol/L (ref 135–145)
TCO2: 23 mmol/L (ref 22–32)
TCO2: 21 mmol/L — ABNORMAL LOW (ref 22–32)
TCO2: 21 mmol/L — ABNORMAL LOW (ref 22–32)
pCO2 arterial: 47.7 mmHg (ref 32.0–48.0)
pCO2 arterial: 52.8 mmHg — ABNORMAL HIGH (ref 32.0–48.0)
pCO2 arterial: 46.1 mmHg (ref 32.0–48.0)
pH, Arterial: 7.247 — ABNORMAL LOW (ref 7.350–7.450)
pH, Arterial: 7.167 — CL (ref 7.350–7.450)
pH, Arterial: 7.237 — ABNORMAL LOW (ref 7.350–7.450)
pO2, Arterial: 50 mmHg — ABNORMAL LOW (ref 83.0–108.0)
pO2, Arterial: 53 mmHg — ABNORMAL LOW (ref 83.0–108.0)
pO2, Arterial: 65 mmHg — ABNORMAL LOW (ref 83.0–108.0)

## 2022-01-07 LAB — PREPARE FRESH FROZEN PLASMA
Unit division: 0
Unit division: 0

## 2022-01-07 LAB — PREPARE PLATELET PHERESIS
Unit division: 0
Unit division: 0
Unit division: 0

## 2022-01-07 LAB — BASIC METABOLIC PANEL
Anion gap: 18 — ABNORMAL HIGH (ref 5–15)
BUN: 19 mg/dL (ref 8–23)
CO2: 19 mmol/L — ABNORMAL LOW (ref 22–32)
Calcium: 8.9 mg/dL (ref 8.9–10.3)
Chloride: 104 mmol/L (ref 98–111)
Creatinine, Ser: 5.2 mg/dL — ABNORMAL HIGH (ref 0.44–1.00)
GFR, Estimated: 9 mL/min — ABNORMAL LOW (ref >60)
Glucose, Bld: 111 mg/dL — ABNORMAL HIGH (ref 70–99)
Potassium: 3.7 mmol/L (ref 3.5–5.1)
Sodium: 141 mmol/L (ref 135–145)

## 2022-01-07 LAB — MAGNESIUM
Magnesium: 2.2 mg/dL (ref 1.7–2.4)
Magnesium: 2.2 mg/dL (ref 1.7–2.4)

## 2022-01-07 LAB — BPAM PLATELET PHERESIS
Blood Product Expiration Date: 202302122359
Blood Product Expiration Date: 202302132359
Blood Product Expiration Date: 202302112359
ISSUE DATE / TIME: 202302101429
ISSUE DATE / TIME: 202302101504
Unit Type and Rh: 6200
Unit Type and Rh: 6200
Unit Type and Rh: 600

## 2022-01-07 MED ORDER — PANTOPRAZOLE 2 MG/ML SUSPENSION
40.0000 mg | Freq: Every day | ORAL | Status: DC
  Administered 2022-01-07 – 2022-02-03 (×28): 40 mg
  Filled 2022-01-07 (×29): qty 20

## 2022-01-07 MED ORDER — ALTEPLASE 2 MG IJ SOLR: 2.0000 mg | Freq: Once | INTRAMUSCULAR | Status: DC | PRN

## 2022-01-07 MED ORDER — SODIUM CHLORIDE 0.9 % FOR CRRT: INTRAVENOUS_CENTRAL | Status: DC | PRN

## 2022-01-07 MED ORDER — POTASSIUM CHLORIDE 10 MEQ/50ML IV SOLN
10.0000 meq | INTRAVENOUS | Status: AC
  Administered 2022-01-07 (×3): 10 meq via INTRAVENOUS
  Filled 2022-01-07 (×3): qty 50

## 2022-01-07 MED ORDER — INSULIN DETEMIR 100 UNIT/ML ~~LOC~~ SOLN
15.0000 [IU] | Freq: Two times a day (BID) | SUBCUTANEOUS | Status: DC
  Administered 2022-01-07 – 2022-01-08 (×3): 15 [IU] via SUBCUTANEOUS
  Filled 2022-01-07 (×4): qty 0.15

## 2022-01-07 MED ORDER — HEPARIN SODIUM (PORCINE) 1000 UNIT/ML DIALYSIS
1000.0000 [IU] | INTRAMUSCULAR | Status: DC | PRN
  Administered 2022-01-07: 2800 [IU] via INTRAVENOUS_CENTRAL
  Filled 2022-01-07: qty 6
  Filled 2022-01-07: qty 3
  Filled 2022-01-07: qty 6

## 2022-01-07 MED ORDER — ROSUVASTATIN CALCIUM 20 MG PO TABS
20.0000 mg | ORAL_TABLET | Freq: Every day | ORAL | Status: DC
  Administered 2022-01-07 – 2022-01-25 (×19): 20 mg
  Filled 2022-01-07 (×19): qty 1

## 2022-01-07 MED ORDER — HEPARIN SODIUM (PORCINE) 1000 UNIT/ML DIALYSIS
1000.0000 [IU] | INTRAMUSCULAR | Status: DC | PRN
  Filled 2022-01-07: qty 6

## 2022-01-07 MED ORDER — SODIUM BICARBONATE 8.4 % IV SOLN
50.0000 meq | Freq: Once | INTRAVENOUS | Status: AC
  Administered 2022-01-07: 50 meq via INTRAVENOUS

## 2022-01-07 MED ORDER — SODIUM CHLORIDE 0.9% FLUSH
10.0000 mL | Freq: Two times a day (BID) | INTRAVENOUS | Status: DC
  Administered 2022-01-07 – 2022-01-14 (×10): 10 mL
  Administered 2022-01-15: 10:00:00 20 mL
  Administered 2022-01-15 – 2022-01-16 (×2): 10 mL
  Administered 2022-01-17: 10:00:00 20 mL
  Administered 2022-01-17 – 2022-01-18 (×2): 10 mL
  Administered 2022-01-18: 10:00:00 20 mL
  Administered 2022-01-19 – 2022-01-29 (×20): 10 mL
  Administered 2022-01-30: 30 mL
  Administered 2022-01-31 (×2): 10 mL
  Administered 2022-02-01: 30 mL
  Administered 2022-02-01 – 2022-02-03 (×3): 10 mL

## 2022-01-07 MED ORDER — VITAL HIGH PROTEIN PO LIQD
1000.0000 mL | ORAL | Status: DC
  Administered 2022-01-07 – 2022-01-08 (×2): 1000 mL

## 2022-01-07 MED ORDER — PRISMASOL BGK 4/2.5 32-4-2.5 MEQ/L EC SOLN: Status: DC

## 2022-01-07 MED ORDER — CEFAZOLIN SODIUM-DEXTROSE 2-4 GM/100ML-% IV SOLN
2.0000 g | Freq: Two times a day (BID) | INTRAVENOUS | Status: AC
  Administered 2022-01-07 – 2022-01-08 (×3): 2 g via INTRAVENOUS
  Filled 2022-01-07 (×3): qty 100

## 2022-01-07 MED ORDER — PRISMASOL BGK 4/2.5 32-4-2.5 MEQ/L REPLACEMENT SOLN: Status: DC

## 2022-01-07 MED ORDER — SODIUM CHLORIDE 0.9% FLUSH: 10.0000 mL | INTRAVENOUS | Status: DC | PRN

## 2022-01-07 MED ORDER — DOCUSATE SODIUM 50 MG/5ML PO LIQD
200.0000 mg | Freq: Every day | ORAL | Status: DC
  Administered 2022-01-07 – 2022-01-27 (×13): 200 mg
  Filled 2022-01-07 (×17): qty 20

## 2022-01-07 MED ORDER — INSULIN ASPART 100 UNIT/ML IJ SOLN
1.0000 [IU] | INTRAMUSCULAR | Status: DC
  Administered 2022-01-07: 1 [IU] via SUBCUTANEOUS
  Administered 2022-01-07: 2 [IU] via SUBCUTANEOUS
  Administered 2022-01-08: 3 [IU] via SUBCUTANEOUS
  Administered 2022-01-08: 2 [IU] via SUBCUTANEOUS
  Administered 2022-01-08: 3 [IU] via SUBCUTANEOUS
  Administered 2022-01-08: 2 [IU] via SUBCUTANEOUS
  Administered 2022-01-08 – 2022-01-09 (×7): 3 [IU] via SUBCUTANEOUS
  Administered 2022-01-10: 2 [IU] via SUBCUTANEOUS
  Administered 2022-01-11 – 2022-01-12 (×5): 1 [IU] via SUBCUTANEOUS
  Administered 2022-01-12 (×3): 2 [IU] via SUBCUTANEOUS
  Administered 2022-01-13 (×4): 1 [IU] via SUBCUTANEOUS
  Administered 2022-01-13 – 2022-01-14 (×2): 2 [IU] via SUBCUTANEOUS
  Administered 2022-01-14: 3 [IU] via SUBCUTANEOUS
  Administered 2022-01-14 (×2): 2 [IU] via SUBCUTANEOUS
  Administered 2022-01-14: 3 [IU] via SUBCUTANEOUS
  Administered 2022-01-15 (×2): 1 [IU] via SUBCUTANEOUS
  Administered 2022-01-16 (×2): 2 [IU] via SUBCUTANEOUS

## 2022-01-07 NOTE — Progress Notes (Signed)
[place] Progress Note Patient Name: Jody Nguyen DOB: Sep 13, 1956 MRN: [id number]   Date of Service  01/07/2022  HPI/Events of Note  5F with cardiogenic shock following CABGx3 on multiple pressors. Overnight worsening hypotension in setting respiratory and metabolic acidosis on ABG  SBP in upper 70s/low 80s.  eICU Interventions  Give 1 am bicarb to bridge to CRRT this am Increase RR to 26 for hypercarbia     Intervention Category Intermediate Interventions: Hypotension - evaluation and management  Arthor Gorter Rodman Pickle 01/07/2022, 6:14 AM

## 2022-01-07 NOTE — Progress Notes (Signed)
NAME:  Jody Nguyen, MRN:  [id number], DOB:  Apr 08, 1956, LOS: 6 ADMISSION DATE:  01/05/2022, CONSULTATION DATE:  01/22/2022 REFERRING MD:  Martie Lee, TRH, CHIEF COMPLAINT:  SOB   History of Present Illness:  66 yo F PMH ESRD, non-obstructive CAD, suspected pulmonary hypertension, Hx PE on eliquis who presented to [facility] ED 2/4 with chest pain, which radiates to arm. Similar sx had also promoted ED presentation on 2/1 at which time trops were 100. She was seen by cardiology outpt on 2/2 and chest pain was felt to be related to volume shift as pain had occurred during dialysis.   On 2/4 presentation, Pt states that episodic chest pain has been intermittent for the past week. Non-exertional, lasts approx 10 minutes. Associated nausea and SOB. Non-specific EKG changes  & was d/w cardiology, ruled out for STEMI. Trop 200. Placed on [place] for SpO2 80s.Admitted to [facility] for obvs with concern for PE and started on heparin gtt. Awaiting VQ scan. ECHO has been performed which reveals Severe RV RA enlargement, severely reduced RV function and septal bowing, felt consistent with chronic process such as severe pulmonary HTN.    PCCM has been consulted in this setting   Pertinent  Medical History  ESRD PE Pulmonary HTN  CAD CVA HLD HTN S/p MV repair  Pericardial effusion r/t minoxidil  Diastolic HF DM2  Significant Hospital Events: Including procedures, antibiotic start and stop dates in addition to other pertinent events   2/4 ED presentation with recurring chest pain -- STEMI ruled out. Stable, mildly hypoxic and placed on [place] 2/5 admit to [facility] for obvs. Concern for PE, ECHO performed by cardiology reportedly shows RV failure, but felt more consistent with chronic process like severe pulmHTN. Awaiting VQ scan. Pulm consulted  2/7 cardiac cath. 3 vessel dz. CABG rec 2/10 CABG, TVR  Interim History / Subjective:   Remains on high dose vasoactive support.    Objective   Blood pressure (!) 100/59, pulse  (!) 123, temperature (!) 96.8 F (36 C), resp. rate (!) 22, height 4' 11.5" (1.511 m), weight 83.2 kg, SpO2 99 %. CVP:  [11 mmHg-44 mmHg] 25 mmHg  Vent Mode: SIMV;PRVC;PSV FiO2 (%):  [50 %-80 %] 80 % Set Rate:  [16 bmp-26 bmp] 26 bmp Vt Set:  [440 mL] 440 mL PEEP:  [5 cmH20-10 cmH20] 10 cmH20 Pressure Support:  [10 cmH20-20 cmH20] 10 cmH20 Plateau Pressure:  [24 cmH20-27 cmH20] 26 cmH20   Intake/Output Summary (Last 24 hours) at 01/07/2022 1201 Last data filed at 01/07/2022 1150 Gross per 24 hour  Intake 7917.35 ml  Output 7304 ml  Net 613.35 ml    Filed Weights   01/05/22 1722 01/05/22 2015 01/07/22 0600  Weight: 76.7 kg 74.4 kg 83.2 kg    Examination: General: overweight middle aged female, short stature  HEENT: OGT and ETT in place.  Still significant facial edema.  Neuro: will awake and follow commands with no focal deficits. CV: regular, tachy. HS normal.  Minimal chest tube drainage.  PULM:  Clear vent assisted breaths.  GI: soft, non-distended Extremities: warm/dry, trace edema. [place].  Ancillary tests personally reviewed:   CXR: bilateral interstitial pattern, post operative changes.  ABG: 7.23/46/65 Creatinine 5.20 HB: 11.2  Assessment & Plan:   Coronary artery disease: 3 vessel disease diagnosed on cardiac cath 2/7. S/p CABG 2/10. - management per cardiology, CVTS Cardiogenic shock: in the post cardiac surgery setting.  Acute respiratory failure with hypoxia  Pulmonary HTN  Acute on chronic systolic/diastolic  CHF Tricuspid regurgitation, severe: now status post tricuspid valve repair S/p MVR 2016 ESRD HLD DM2 Hx PE   Plan:   - Attempt wean vasopressors starting with NE. Once NE < 11mcg/min, then wean vasopressin.  Keep MAP >65 - Titrate Epinephrine to keep CI>2.5. Continue milrinone. - Initiate CRRT for HD and HF of cytokines to potentially reduce vasopressor requirements.   - UF to keep volume even initially, if tolerates can start removing  50-100 net/h - Continue full ventilator support with lung protective strategy - Keep PEEP 10 or less to prevent worsening RV function.   Best Practice (right click and "Reselect all SmartList Selections" daily)   Best Practice (right click and "Reselect all SmartList Selections" daily)   Diet/type: tubefeeds trickle only while on high dose vasopressors DVT prophylaxis: prophylactic heparin  GI prophylaxis: PPI Lines: Central line, Dialysis Catheter, and Arterial Line Foley:  Yes, and it is still needed Code Status:  full code Last date of multidisciplinary goals of care discussion [Family updated 2/10]  CRITICAL CARE Performed by: Kipp Brood   Total critical care time: 50 minutes  Critical care time was exclusive of separately billable procedures and treating other patients.  Critical care was necessary to treat or prevent imminent or life-threatening deterioration.  Critical care was time spent personally by me on the following activities: development of treatment plan with patient and/or surrogate as well as nursing, discussions with consultants, evaluation of patient's response to treatment, examination of patient, obtaining history from patient or surrogate, ordering and performing treatments and interventions, ordering and review of laboratory studies, ordering and review of radiographic studies, pulse oximetry, re-evaluation of patient's condition and participation in multidisciplinary rounds.  Kipp Brood, MD [facility] ICU Physician [place]  Pager: [phone] Mobile: [phone] After hours: [phone].   01/07/2022 12:01 PM

## 2022-01-07 NOTE — Progress Notes (Signed)
PHARMACY NOTE:  ANTIMICROBIAL RENAL DOSAGE ADJUSTMENT  Current antimicrobial regimen includes a mismatch between antimicrobial dosage and estimated renal function.  As per policy approved by the Pharmacy & Therapeutics and Medical Executive Committees, the antimicrobial dosage will be adjusted accordingly.  Current antimicrobial dosage:  Cefazolin 1g IV every 24 hours  Indication: Surgical Prophylaxis   Renal Function:  Estimated Creatinine Clearance: 10.1 mL/min (A) (by C-G formula based on SCr of 5.2 mg/dL (H)). []      On intermittent HD, scheduled: [x]      On CRRT    Antimicrobial dosage has been changed to:  Cefazolin 2g IV q12h x3  Additional comments:   Arrie Senate, PharmD, BCPS, [facility] Clinical Pharmacist [phone] Please check AMION for all [place] numbers 01/07/2022

## 2022-01-07 NOTE — Progress Notes (Signed)
1 Day Post-Op Procedure(s) (LRB): CORONARY ARTERY BYPASS GRAFTING (CABG) TIMES THREE ON CARDIOPULMONARY BYPASS. LIMA TO LAD, SVG TO PD, SVG TO DIAG (N/A) TRICUSPID VALVE REPAIR USING AN 36MM EDWARDS MC3 TRICUSPID ANNULOPLASTY RING (N/A) TRANSESOPHAGEAL ECHOCARDIOGRAM (TEE) (N/A) APPLICATION OF CELL SAVER (N/A) ENDOVEIN HARVEST OF GREATER SAPHENOUS VEIN (Right) REPAIR OF PATENT FORAMEN OVALE (N/A) Subjective: Intubated and sedated.  Has remained on the same doses of vasopressors today:  Milrinone 0.3, NE 40, vasopressin 0.04, epi 10   MAP 60's. CI 2.7 by Flow Track. CVP 21.  On CRRT to keep even   Objective: Vital signs in last 24 hours: Temp:  [95.7 F (35.4 C)-98.8 F (37.1 C)] 97.2 F (36.2 C) (02/11 2215) Pulse Rate:  [107-138] 123 (02/11 2215) Cardiac Rhythm: Sinus tachycardia (02/11 2000) Resp:  [16-33] 26 (02/11 2215) BP: (82-104)/(49-64) 89/64 (02/11 2000) SpO2:  [89 %-100 %] 97 % (02/11 2215) Arterial Line BP: (77-155)/(39-87) 106/50 (02/11 2215) FiO2 (%):  [50 %-80 %] 60 % (02/11 2000) Weight:  [83.2 kg] 83.2 kg (02/11 0600)  Hemodynamic parameters for last 24 hours: CVP:  [11 mmHg-44 mmHg] 20 mmHg  Intake/Output from previous day: 02/10 0701 - 02/11 0700 In: 7956.8 [I.V.:3422.7; TSVXB:9390; NG/GT:30; IV Piggyback:1605.1] Out: 3009 [Urine:5; Drains:60; QZRAQ:7622; Chest Tube:590] Intake/Output this shift: Total I/O In: 576.3 [I.V.:357.6; NG/GT:130; IV Piggyback:88.7] Out: 397 [Other:377; Chest Tube:20]  General appearance: intubated and sedated on vent Neurologic: per nurse she wakes up and follows commands Heart: regular rate and rhythm Lungs: clear to auscultation bilaterally Abdomen: soft, bowel sounds present Extremities: anasarca Wound: dressing dry.  Lab Results: Recent Labs    01/07/22 0541 01/07/22 0648 01/07/22 1609  WBC 16.3*  --  17.3*  HGB 11.0*   11.9* 11.2* 10.9*  HCT 34.6*   35.0* 33.0* 32.1*  PLT 148*  --  123*   BMET:  Recent  Labs    01/07/22 0541 01/07/22 0648 01/07/22 1503  NA 141   140 141 139  K 3.7   3.6 3.6 4.0  CL 104  --  106  CO2 19*  --  21*  GLUCOSE 111*  --  97  BUN 19  --  19  CREATININE 5.20*  --  4.26*  CALCIUM 8.9  --  8.4*    PT/INR:  Recent Labs    12/29/2021 1730  LABPROT 21.8*  INR 1.9*   ABG    Component Value Date/Time   PHART 7.237 (L) 01/07/2022 0648   HCO3 19.7 (L) 01/07/2022 0648   TCO2 21 (L) 01/07/2022 0648   ACIDBASEDEF 8.0 (H) 01/07/2022 0648   O2SAT 89.0 01/07/2022 0648   CBG (last 3)  Recent Labs    01/07/22 1609 01/07/22 1705 01/07/22 1934  GLUCAP 97 111* 131*   CXR: lung base opacity consistent of atelectasis and effusion. Pulmonary vascular congestion.  Assessment/Plan: S/P Procedure(s) (LRB): CORONARY ARTERY BYPASS GRAFTING (CABG) TIMES THREE ON CARDIOPULMONARY BYPASS. LIMA TO LAD, SVG TO PD, SVG TO DIAG (N/A) TRICUSPID VALVE REPAIR USING AN 36MM EDWARDS MC3 TRICUSPID ANNULOPLASTY RING (N/A) TRANSESOPHAGEAL ECHOCARDIOGRAM (TEE) (N/A) APPLICATION OF CELL SAVER (N/A) ENDOVEIN HARVEST OF GREATER SAPHENOUS VEIN (Right) REPAIR OF PATENT FORAMEN OVALE (N/A)  POD 2  Hemodynamics fairly stable today but on multiple high dose vasopressors/inotropes to support BP and CO. Good CO per Flow Track but who knows how accurate that is. Will check [place] in am. SVR increasing on Flow Track. She is also on PEEP 10 which will affect hemodynamics and pressor requirement.  ESRD on CRRT. Ideally would like to start removing some volume if BP allows.  VDRF: CCM following.   Tube feeds started for nutrition.   LOS: 6 days    Gaye Pollack 01/07/2022

## 2022-01-07 NOTE — Procedures (Signed)
Central Venous Catheter Insertion Procedure Note  Jody Nguyen  [id number]  1956-06-10  Date:01/07/22  Time:2:38 PM   Provider Performing:Athaliah Baumbach   Procedure: Insertion of Non-tunneled Central Venous Catheter(36556)with US guidance (32671)    Indication(s) Hemodialysis  Consent Risks of the procedure as well as the alternatives and risks of each were explained to the patient and/or caregiver.  Consent for the procedure was obtained and is signed in the bedside chart  Anesthesia Topical only with 1% lidocaine   Timeout Verified patient identification, verified procedure, site/side was marked, verified correct patient position, special equipment/implants available, medications/allergies/relevant history reviewed, required imaging and test results available.  Sterile Technique Maximal sterile technique including full sterile barrier drape, hand hygiene, sterile gown, sterile gloves, mask, hair covering, sterile ultrasound probe cover (if used).  Procedure Description Area of catheter insertion was cleaned with chlorhexidine and draped in sterile fashion.   With real-time ultrasound guidance a 23F 20cm Power Trialysis HD catheter was placed into the left femoral vein.  Nonpulsatile blood flow and easy flushing noted in all ports.  The catheter was sutured in place and sterile dressing applied.     Complications/Tolerance None; patient tolerated the procedure well. Chest X-ray is ordered to verify placement for internal jugular or subclavian cannulation.  Chest x-ray is not ordered for femoral cannulation.  EBL Minimal  Specimen(s) None  Jody Brood, MD [facility] ICU Physician [place]  Pager: [phone] Or Epic Secure Chat After hours: [phone].  01/07/2022, 2:39 PM

## 2022-01-07 NOTE — Anesthesia Postprocedure Evaluation (Signed)
Anesthesia Post Note  Patient: EMELINE SIMPSON  Procedure(s) Performed: CORONARY ARTERY BYPASS GRAFTING (CABG) TIMES THREE ON CARDIOPULMONARY BYPASS. LIMA TO LAD, SVG TO PD, SVG TO DIAG (Chest) TRICUSPID VALVE REPAIR USING AN 36MM EDWARDS MC3 TRICUSPID ANNULOPLASTY RING (Chest) TRANSESOPHAGEAL ECHOCARDIOGRAM (TEE) (Esophagus) APPLICATION OF CELL SAVER (Chest) ENDOVEIN HARVEST OF GREATER SAPHENOUS VEIN (Right: Leg Upper) REPAIR OF PATENT FORAMEN OVALE     Patient location during evaluation: ICU Anesthesia Type: General Level of consciousness: sedated Pain management: pain level controlled Vital Signs Assessment: post-procedure vital signs reviewed and stable Respiratory status: patient remains intubated per anesthesia plan Cardiovascular status: stable (Pt remains on several pressors for support.) Postop Assessment: no apparent nausea or vomiting Anesthetic complications: no   No notable events documented.  Last Vitals:  Vitals:   01/07/22 0845 01/07/22 0900  BP:    Pulse: (!) 121 (!) 125  Resp: (!) 0 (!) 0  Temp: 37.1 C 37 C  SpO2: 100% 100%    Last Pain:  Vitals:   01/07/22 0800  TempSrc: Esophageal  PainSc:                  Garfield S

## 2022-01-07 NOTE — Progress Notes (Signed)
[facility] Progress Note  Name: Jody Nguyen MRN: [id number] DOB: 02/05/1956   Subjective:  Seen in ICU.  Continues to be tachy, continues on 3 pressors and milrinone     Intake/Output Summary (Last 24 hours) at 01/07/2022 0919 Last data filed at 01/07/2022 0700 Gross per 24 hour  Intake 7598.85 ml  Output 7114 ml  Net 484.85 ml     Vitals:  Vitals:   01/07/22 0815 01/07/22 0830 01/07/22 0845 01/07/22 0900  BP:      Pulse: (!) 126 (!) 126 (!) 121 (!) 125  Resp: (!) 4 (!) 27 (!) 0 (!) 0  Temp: 98.8 F (37.1 C) 98.8 F (37.1 C) 98.8 F (37.1 C) 98.6 F (37 C)  TempSrc:      SpO2: 97% 96% 100% 100%  Weight:      Height:         Physical Exam:  General adult female in bed intubated critically ill  HEENT normocephalic atraumatic; lips / face swollen  Neck supple trachea midline Lungs coarse mechanical breaths sounds; FIO2 60 and PEEP 5 Heart S1S2 tachycardic at 122  Abdomen soft obese habitus Extremities 1-2+ diffuse edema; right leg is wrapped with drain  Neuro - sedation currently running  Access: right arm AVF in place, no bruit or thrill noted  Medications reviewed   Labs:  BMP Latest Ref Rng & Units 01/07/2022 01/07/2022 01/07/2022  Glucose 70 - 99 mg/dL - 111(H) -  BUN 8 - 23 mg/dL - 19 -  Creatinine 0.44 - 1.00 mg/dL - 5.20(H) -  BUN/Creat Ratio 6 - 22 (calc) - - -  Sodium 135 - 145 mmol/L 141 141 140  Potassium 3.5 - 5.1 mmol/L 3.6 3.7 3.6  Chloride 98 - 111 mmol/L - 104 -  CO2 22 - 32 mmol/L - 19(L) -  Calcium 8.9 - 10.3 mg/dL - 8.9 -   Dialysis Orders: MWF at [facility] 3:15hr, 450/A1.5, EDW 75.5kg, 2K/2Ca, UFP #2, AVF, heparin 2500 unit bolus - Hectoral 39mcg IV q HD - Sensipar 90mg  PO q HD - Mircera 147mcg IV q 2 weeks (ordered, not yet given. Last Mircera 100 on 12/07/21)   Assessment/Plan:   CAD s/p CABG:  per CT surgery   ESRD:  normally MWF schedule, last HD Thursday 2/09.  Post-CABG she is hypotensive on pressors . Plan is for  CRRT. Infiltrated AVF - AVF infiltrated on Tuesday. Expert cannulator able to stick 2/09 and completed full treatment. Postop this am no bruit/ thrill over AVF. Follow.  Hypotension - on 3 pressors and milrinone Volume: hx of HTN now in shock post-op. Up 8kg post op which is typical for esrd pt. Filling pressures look normal. Will keep even today on CRRT.   Anemia CKD : s/p multiple units of PRBC's   Metabolic bone disease: resume home binders when taking PO. Also sensipar and vdra.   Pulm HTN:  Followed at [facility], on Revatio and opsumit  T2DM  Hx of MV replacement - in 2016  Sol Blazing, MD 01/07/2022 9:19 AM

## 2022-01-08 ENCOUNTER — Inpatient Hospital Stay (HOSPITAL_COMMUNITY): Payer: Medicare Other

## 2022-01-08 DIAGNOSIS — Z992 Dependence on renal dialysis: Secondary | ICD-10-CM | POA: Diagnosis not present

## 2022-01-08 DIAGNOSIS — N186 End stage renal disease: Secondary | ICD-10-CM | POA: Diagnosis not present

## 2022-01-08 LAB — RENAL FUNCTION PANEL
Albumin: 2.9 g/dL — ABNORMAL LOW (ref 3.5–5.0)
Albumin: 3.1 g/dL — ABNORMAL LOW (ref 3.5–5.0)
Anion gap: 13 (ref 5–15)
Anion gap: 12 (ref 5–15)
BUN: 17 mg/dL (ref 8–23)
BUN: 16 mg/dL (ref 8–23)
CO2: 20 mmol/L — ABNORMAL LOW (ref 22–32)
CO2: 21 mmol/L — ABNORMAL LOW (ref 22–32)
Calcium: 8.2 mg/dL — ABNORMAL LOW (ref 8.9–10.3)
Calcium: 8.2 mg/dL — ABNORMAL LOW (ref 8.9–10.3)
Chloride: 102 mmol/L (ref 98–111)
Chloride: 103 mmol/L (ref 98–111)
Creatinine, Ser: 3.32 mg/dL — ABNORMAL HIGH (ref 0.44–1.00)
Creatinine, Ser: 2.66 mg/dL — ABNORMAL HIGH (ref 0.44–1.00)
GFR, Estimated: 15 mL/min — ABNORMAL LOW (ref >60)
GFR, Estimated: 19 mL/min — ABNORMAL LOW (ref >60)
Glucose, Bld: 263 mg/dL — ABNORMAL HIGH (ref 70–99)
Glucose, Bld: 204 mg/dL — ABNORMAL HIGH (ref 70–99)
Phosphorus: 4.2 mg/dL (ref 2.5–4.6)
Phosphorus: 3.2 mg/dL (ref 2.5–4.6)
Potassium: 4.5 mmol/L (ref 3.5–5.1)
Potassium: 4.4 mmol/L (ref 3.5–5.1)
Sodium: 135 mmol/L (ref 135–145)
Sodium: 136 mmol/L (ref 135–145)

## 2022-01-08 LAB — COOXEMETRY PANEL
Carboxyhemoglobin: 1.3 % (ref 0.5–1.5)
Methemoglobin: 0.7 % (ref 0.0–1.5)
O2 Saturation: 68.7 %
Total hemoglobin: 9.4 g/dL — ABNORMAL LOW (ref 12.0–16.0)

## 2022-01-08 LAB — GLUCOSE, CAPILLARY
Glucose-Capillary: 254 mg/dL — ABNORMAL HIGH (ref 70–99)
Glucose-Capillary: 193 mg/dL — ABNORMAL HIGH (ref 70–99)
Glucose-Capillary: 210 mg/dL — ABNORMAL HIGH (ref 70–99)
Glucose-Capillary: 198 mg/dL — ABNORMAL HIGH (ref 70–99)
Glucose-Capillary: 220 mg/dL — ABNORMAL HIGH (ref 70–99)

## 2022-01-08 LAB — APTT: aPTT: 39 seconds — ABNORMAL HIGH (ref 24–36)

## 2022-01-08 LAB — MAGNESIUM: Magnesium: 2.2 mg/dL (ref 1.7–2.4)

## 2022-01-08 MED ORDER — METHYLENE BLUE 0.5 % INJ SOLN
0.5000 mg/kg | Status: AC
  Administered 2022-01-08: 40.5 mg via INTRAVENOUS
  Filled 2022-01-08: qty 8.1

## 2022-01-08 MED ORDER — HYDROCORTISONE SOD SUC (PF) 100 MG IJ SOLR
100.0000 mg | Freq: Two times a day (BID) | INTRAMUSCULAR | Status: DC
  Administered 2022-01-08 – 2022-01-10 (×6): 100 mg via INTRAVENOUS
  Filled 2022-01-08 (×6): qty 2

## 2022-01-08 MED ORDER — HEPARIN SODIUM (PORCINE) 5000 UNIT/ML IJ SOLN
5000.0000 [IU] | Freq: Three times a day (TID) | INTRAMUSCULAR | Status: DC
  Administered 2022-01-08 (×2): 5000 [IU] via SUBCUTANEOUS
  Filled 2022-01-08 (×2): qty 1

## 2022-01-08 MED ORDER — METHYLENE BLUE 0.5 % INJ SOLN
2.0000 mg/kg | Freq: Once | Status: AC
  Administered 2022-01-08: 162.5 mg via INTRAVENOUS
  Filled 2022-01-08: qty 32.5

## 2022-01-08 MED ORDER — INSULIN DETEMIR 100 UNIT/ML ~~LOC~~ SOLN
25.0000 [IU] | Freq: Two times a day (BID) | SUBCUTANEOUS | Status: DC
  Administered 2022-01-08: 25 [IU] via SUBCUTANEOUS
  Filled 2022-01-08 (×3): qty 0.25

## 2022-01-08 MED ORDER — METHYLENE BLUE 0.5 % INJ SOLN
1.0000 mg/kg | Freq: Once | Status: AC
  Administered 2022-01-08: 81 mg via INTRAVENOUS
  Filled 2022-01-08: qty 16.2

## 2022-01-08 NOTE — Progress Notes (Signed)
[facility] Progress Note  Name: Jody Nguyen MRN: [id number] DOB: Apr 22, 1956   Subjective:  Seen in ICU. Tried to keep even overnight w/ some difficulty per RN, +1.2 L. 3 pressors and milrinone today. Methylene blue started this am. CVP 20-25. Filter clotted once, were able to rinse back the blood before clotting.      Intake/Output Summary (Last 24 hours) at 01/08/2022 1229 Last data filed at 01/08/2022 1200 Gross per 24 hour  Intake 3975.03 ml  Output 3948 ml  Net 27.03 ml     Vitals:  Vitals:   01/08/22 1130 01/08/22 1145 01/08/22 1200 01/08/22 1215  BP:      Pulse: (!) 131 (!) 132 (!) 132 (!) 132  Resp: (!) 26 (!) 26 (!) 28 (!) 26  Temp: (!) 96.8 F (36 C) (!) 96.8 F (36 C) (!) 96.6 F (35.9 C) (!) 96.6 F (35.9 C)  TempSrc:   Esophageal   SpO2: 96% 95% 95% 94%  Weight:      Height:         Physical Exam:  General adult female in bed intubated critically ill  HEENT normocephalic atraumatic; lips / face swollen  Neck supple trachea midline Lungs coarse mechanical breaths sounds; FIO2 60 and PEEP 5 Heart S1S2 tachycardic at 122  Abdomen soft obese habitus Extremities 1-2+ diffuse edema; right leg is wrapped with drain  Neuro - sedation currently running  Access: right arm AVF in place, no bruit or thrill noted  Medications reviewed   Labs:  BMP Latest Ref Rng & Units 01/08/2022 01/07/2022 01/07/2022  Glucose 70 - 99 mg/dL 263(H) 97 -  BUN 8 - 23 mg/dL 17 19 -  Creatinine 0.44 - 1.00 mg/dL 3.32(H) 4.26(H) -  BUN/Creat Ratio 6 - 22 (calc) - - -  Sodium 135 - 145 mmol/L 135 139 141  Potassium 3.5 - 5.1 mmol/L 4.5 4.0 3.6  Chloride 98 - 111 mmol/L 102 106 -  CO2 22 - 32 mmol/L 20(L) 21(L) -  Calcium 8.9 - 10.3 mg/dL 8.2(L) 8.4(L) -   Dialysis Orders: MWF at [facility] 3:15hr, 450/A1.5, EDW 75.5kg, 2K/2Ca, UFP #2, AVF, heparin 2500 unit bolus - Hectoral 83mcg IV q HD - Sensipar 90mg  PO q HD - Mircera 126mcg IV q 2 weeks (ordered, not yet given.  Last Mircera 100 on 12/07/21)   Assessment/Plan:   CAD s/p CABG:  per CT surgery   ESRD:  normally MWF schedule, last HD Thursday 2/09. Remains on pressors x 3. CRRT started 2/11, remains critically ill. Cont CRRT.  Infiltrated AVF - AVF infiltrated on Tuesday. Expert cannulator able to stick 2/09 and completed full treatment. Postop no bruit today again, suspect has clotted. Postpone this issue to when more stable.  Hypotension - 3 pressors and milrinone Volume: hx of HTN now in shock post-op. Up 6kg today. Filling pressures are up a bit. Keeping even again today on CRRT. If BP's stabilize we will try pulling fluid.   Anemia CKD : s/p PRBC's , Hb 10-11 post op, no esa needs now. Will dc darbepoeitin order (ordered but never given).   Metabolic bone disease: resume home binders when taking PO. Also sensipar. Cont IV vdra.   Pulm HTN:  Followed at [facility], on Revatio and opsumit  T2DM  Hx of MV replacement - in 2016  Sol Blazing, MD 01/08/2022 12:29 PM

## 2022-01-08 NOTE — Progress Notes (Signed)
2 Days Post-Op Procedure(s) (LRB): CORONARY ARTERY BYPASS GRAFTING (CABG) TIMES THREE ON CARDIOPULMONARY BYPASS. LIMA TO LAD, SVG TO PD, SVG TO DIAG (N/A) TRICUSPID VALVE REPAIR USING AN 36MM EDWARDS MC3 TRICUSPID ANNULOPLASTY RING (N/A) TRANSESOPHAGEAL ECHOCARDIOGRAM (TEE) (N/A) APPLICATION OF CELL SAVER (N/A) ENDOVEIN HARVEST OF GREATER SAPHENOUS VEIN (Right) REPAIR OF PATENT FORAMEN OVALE (N/A) Subjective:  Remains sedated on vent but opens eyes and follows commands.  Remains vasoplegic on milrinone 0.3, NE 39, epi 10, vasopressin 0.04. Received some methylene blue per CCM this am. CI 3, SVR 910 by FloTrac. Co-ox 69% this am.  Remains on CRRT keeping even.    Objective: Vital signs in last 24 hours: Temp:  [96.1 F (35.6 C)-97.9 F (36.6 C)] 97 F (36.1 C) (02/12 1400) Pulse Rate:  [120-138] 129 (02/12 1400) Cardiac Rhythm: Sinus tachycardia;Heart block (02/12 1200) Resp:  [19-31] 26 (02/12 1400) BP: (89-112)/(44-64) 95/50 (02/12 0745) SpO2:  [93 %-98 %] 94 % (02/12 1400) Arterial Line BP: (80-132)/(40-74) 111/52 (02/12 1400) FiO2 (%):  [40 %-80 %] 40 % (02/12 1200) Weight:  [81.2 kg] 81.2 kg (02/12 0600)  Hemodynamic parameters for last 24 hours: CVP:  [15 mmHg-28 mmHg] 23 mmHg  Intake/Output from previous day: 02/11 0701 - 02/12 0700 In: 3785.8 [I.V.:2870.7; NG/GT:665; IV Piggyback:250.1] Out: 3572 [Emesis/NG output:50; Drains:90; Chest Tube:410] Intake/Output this shift: Total I/O In: 1272.7 [I.V.:808.6; NG/GT:270; IV Piggyback:194.1] Out: 1144 [Drains:20; MEQAS:3419; Chest Tube:90]  General appearance: sedated but opens eyes and follows commands Neurologic: non-focal Heart: regular rate and rhythm Lungs: clear to auscultation bilaterally Abdomen: soft, non-tender; bowel sounds normal Extremities: marked anasarca. Facial edema improved Wound: Dressing dry  Lab Results: Recent Labs    01/07/22 0541 01/07/22 0648 01/07/22 1609  WBC 16.3*  --  17.3*  HGB  11.0*   11.9* 11.2* 10.9*  HCT 34.6*   35.0* 33.0* 32.1*  PLT 148*  --  123*   BMET:  Recent Labs    01/07/22 1503 01/08/22 0358  NA 139 135  K 4.0 4.5  CL 106 102  CO2 21* 20*  GLUCOSE 97 263*  BUN 19 17  CREATININE 4.26* 3.32*  CALCIUM 8.4* 8.2*    PT/INR:  Recent Labs    01/15/2022 1730  LABPROT 21.8*  INR 1.9*   ABG    Component Value Date/Time   PHART 7.237 (L) 01/07/2022 0648   HCO3 19.7 (L) 01/07/2022 0648   TCO2 21 (L) 01/07/2022 0648   ACIDBASEDEF 8.0 (H) 01/07/2022 0648   O2SAT 68.7 01/08/2022 0630   CBG (last 3)  Recent Labs    01/07/22 2355 01/08/22 0353 01/08/22 0723  GLUCAP 151* 254* 193*   CXR: mild bibasilar atelectasis.  Assessment/Plan: S/P Procedure(s) (LRB): CORONARY ARTERY BYPASS GRAFTING (CABG) TIMES THREE ON CARDIOPULMONARY BYPASS. LIMA TO LAD, SVG TO PD, SVG TO DIAG (N/A) TRICUSPID VALVE REPAIR USING AN 36MM EDWARDS MC3 TRICUSPID ANNULOPLASTY RING (N/A) TRANSESOPHAGEAL ECHOCARDIOGRAM (TEE) (N/A) APPLICATION OF CELL SAVER (N/A) ENDOVEIN HARVEST OF GREATER SAPHENOUS VEIN (Right) REPAIR OF PATENT FORAMEN OVALE (N/A)  POD 2  Vasoplegia requiring multiple high dose vasopressors to support which is limiting ability to remove volume with CRRT. Hopefully this will gradually improve.  She is on milrinone which can contribute to dilation.  Marked volume excess: Remove with CRRT when able.  VDRF: won't be able to extubate until we can remove some volume.  Glucose under adequate control but increasing with tube feeds. Will increase Levemir.  Continue tube feeds for nutrition.   LOS:  7 days    Gaye Pollack 01/08/2022

## 2022-01-08 NOTE — Plan of Care (Signed)
°  Problem: Education: Goal: Knowledge of General Education information will improve Description: Including pain rating scale, medication(s)/side effects and non-pharmacologic comfort measures Outcome: Progressing   Problem: Health Behavior/Discharge Planning: Goal: Ability to manage health-related needs will improve Outcome: Progressing   Problem: Clinical Measurements: Goal: Ability to maintain clinical measurements within normal limits will improve Outcome: Progressing Goal: Will remain free from infection Outcome: Progressing Goal: Diagnostic test results will improve Outcome: Progressing Goal: Respiratory complications will improve Outcome: Progressing Goal: Cardiovascular complication will be avoided Outcome: Progressing   Problem: Activity: Goal: Risk for activity intolerance will decrease Outcome: Progressing   Problem: Nutrition: Goal: Adequate nutrition will be maintained Outcome: Progressing   Problem: Coping: Goal: Level of anxiety will decrease Outcome: Progressing   Problem: Elimination: Goal: Will not experience complications related to bowel motility Outcome: Progressing Goal: Will not experience complications related to urinary retention Outcome: Progressing   Problem: Pain Managment: Goal: General experience of comfort will improve Outcome: Progressing   Problem: Safety: Goal: Ability to remain free from injury will improve Outcome: Progressing   Problem: Skin Integrity: Goal: Risk for impaired skin integrity will decrease Outcome: Progressing   Problem: Education: Goal: Will demonstrate proper wound care and an understanding of methods to prevent future damage Outcome: Progressing Goal: Knowledge of disease or condition will improve Outcome: Progressing Goal: Knowledge of the prescribed therapeutic regimen will improve Outcome: Progressing Goal: Individualized Educational Video(s) Outcome: Progressing   Problem: Activity: Goal: Risk for  activity intolerance will decrease Outcome: Progressing   Problem: Cardiac: Goal: Will achieve and/or maintain hemodynamic stability Outcome: Progressing   Problem: Clinical Measurements: Goal: Postoperative complications will be avoided or minimized Outcome: Progressing   Problem: Respiratory: Goal: Respiratory status will improve Outcome: Progressing   Problem: Skin Integrity: Goal: Wound healing without signs and symptoms of infection Outcome: Progressing Goal: Risk for impaired skin integrity will decrease Outcome: Progressing

## 2022-01-08 NOTE — Progress Notes (Signed)
NAME:  Jody Nguyen, MRN:  [id number], DOB:  01-14-1956, LOS: 7 ADMISSION DATE:  01/12/2022, CONSULTATION DATE:  01/21/2022 REFERRING MD:  Martie Lee, TRH, CHIEF COMPLAINT:  SOB   History of Present Illness:  66 yo F PMH ESRD, non-obstructive CAD, suspected pulmonary hypertension, Hx PE on eliquis who presented to [facility] ED 2/4 with chest pain, which radiates to arm. Similar sx had also promoted ED presentation on 2/1 at which time trops were 100. She was seen by cardiology outpt on 2/2 and chest pain was felt to be related to volume shift as pain had occurred during dialysis.   On 2/4 presentation, Pt states that episodic chest pain has been intermittent for the past week. Non-exertional, lasts approx 10 minutes. Associated nausea and SOB. Non-specific EKG changes  & was d/w cardiology, ruled out for STEMI. Trop 200. Placed on [place] for SpO2 80s.Admitted to [facility] for obvs with concern for PE and started on heparin gtt. Awaiting VQ scan. ECHO has been performed which reveals Severe RV RA enlargement, severely reduced RV function and septal bowing, felt consistent with chronic process such as severe pulmonary HTN.    PCCM has been consulted in this setting   Pertinent  Medical History  ESRD PE Pulmonary HTN  CAD CVA HLD HTN S/p MV repair  Pericardial effusion r/t minoxidil  Diastolic HF DM2  Significant Hospital Events: Including procedures, antibiotic start and stop dates in addition to other pertinent events   2/4 ED presentation with recurring chest pain -- STEMI ruled out. Stable, mildly hypoxic and placed on [place] 2/5 admit to [facility] for obvs. Concern for PE, ECHO performed by cardiology reportedly shows RV failure, but felt more consistent with chronic process like severe pulmHTN. Awaiting VQ scan. Pulm consulted  2/7 cardiac cath. 3 vessel dz. CABG rec 2/10 CABG, TVR 2/11 continued vasoplegic shock with very high vasopressor requirements   Interim History / Subjective:   Remains on high  dose vasoactive support, unable to pull fluid with CRRT.   Objective   Blood pressure (!) 95/50, pulse (!) 130, temperature (!) 96.4 F (35.8 C), resp. rate 18, height 4' 11.5" (1.511 m), weight 81.2 kg, SpO2 96 %. CVP:  [17 mmHg-44 mmHg] 28 mmHg  Vent Mode: SIMV;PRVC;PSV FiO2 (%):  [50 %-80 %] 50 % Set Rate:  [26 bmp] 26 bmp Vt Set:  [440 mL] 440 mL PEEP:  [8 cmH20-10 cmH20] 8 cmH20 Pressure Support:  [10 cmH20] 10 cmH20 Plateau Pressure:  [24 cmH20-27 cmH20] 24 cmH20   Intake/Output Summary (Last 24 hours) at 01/08/2022 2993 Last data filed at 01/08/2022 0900 Gross per 24 hour  Intake 3917.48 ml  Output 3891 ml  Net 26.48 ml    Filed Weights   01/05/22 2015 01/07/22 0600 01/08/22 0600  Weight: 74.4 kg 83.2 kg 81.2 kg    Examination: General: overweight middle aged female, short stature  HEENT: OGT and ETT in place.  Facial edema has improved with elevation.  Neuro: will awake and follow commands with no focal deficits. CV: regular, tachy. HS normal.  Minimal chest tube drainage - thin sero-sanguinous fluid.   PULM:  chest clear. On 0.5/8  GI: soft, non-distended Extremities: warm/dry, trace edema. [place].  Ancillary tests personally reviewed:   CXR: bilateral interstitial pattern, post operative changes.  ABG: 7.23/46/65 Creatinine 3.32 with CRRT HB: 9.4 ScvO2 68%  Assessment & Plan:   Coronary artery disease: 3 vessel disease diagnosed on cardiac cath 2/7. S/p CABG 2/10. - management per cardiology,  CVTS Cardiogenic shock: in the post cardiac surgery setting.  Acute respiratory failure with hypoxia  Pulmonary HTN  Acute on chronic systolic/diastolic CHF Tricuspid regurgitation, severe: now status post tricuspid valve repair S/p MVR 2016 ESRD HLD DM2 Hx PE   Plan:   - Ongoing profound vasoplegia is the barrier to clinical progress. Will try methylene blue to help decrease vasopressor requirements.  Will add stress steroids as well.  - Attempt wean  vasopressors starting with NE. Once NE < 28mcg/min, then wean vasopressin.  Keep MAP >65 - Titrate Epinephrine to keep CI>2.5. Continue milrinone. - Initiate CRRT for HD and HF of cytokines to potentially reduce vasopressor requirements.   - UF to keep volume even initially, if tolerates can start removing 50-100 net/h.  Not there yet but at least able to keep even.  - Continue full ventilator support with lung protective strategy - Keep PEEP 10 or less to prevent worsening RV function.   Best Practice (right click and "Reselect all SmartList Selections" daily)   Diet/type: tubefeeds trickle only while on high dose vasopressors DVT prophylaxis: prophylactic heparin  GI prophylaxis: PPI Lines: Central line, Dialysis Catheter, and Arterial Line Foley:  Yes, and it is still needed Code Status:  full code Last date of multidisciplinary goals of care discussion [Family updated 2/10]  CRITICAL CARE Performed by: Kipp Brood  Total critical care time: 40 minutes  Critical care time was exclusive of separately billable procedures and treating other patients.  Critical care was necessary to treat or prevent imminent or life-threatening deterioration.  Critical care was time spent personally by me on the following activities: development of treatment plan with patient and/or surrogate as well as nursing, discussions with consultants, evaluation of patient's response to treatment, examination of patient, obtaining history from patient or surrogate, ordering and performing treatments and interventions, ordering and review of laboratory studies, ordering and review of radiographic studies, pulse oximetry, re-evaluation of patient's condition and participation in multidisciplinary rounds.  Kipp Brood, MD [facility] ICU Physician [place]  Pager: [phone] Mobile: [phone] After hours: [phone].   01/08/2022 9:42 AM

## 2022-01-09 ENCOUNTER — Encounter (HOSPITAL_COMMUNITY): Payer: Self-pay | Admitting: Thoracic Surgery (Cardiothoracic Vascular Surgery)

## 2022-01-09 ENCOUNTER — Inpatient Hospital Stay (HOSPITAL_COMMUNITY): Payer: Medicare Other

## 2022-01-09 DIAGNOSIS — N186 End stage renal disease: Secondary | ICD-10-CM | POA: Diagnosis not present

## 2022-01-09 DIAGNOSIS — Z992 Dependence on renal dialysis: Secondary | ICD-10-CM | POA: Diagnosis not present

## 2022-01-09 LAB — CBC WITH DIFFERENTIAL/PLATELET
Abs Immature Granulocytes: 0.24 10*3/uL — ABNORMAL HIGH (ref 0.00–0.07)
Basophils Absolute: 0 10*3/uL (ref 0.0–0.1)
Basophils Relative: 0 %
Eosinophils Absolute: 1.2 10*3/uL — ABNORMAL HIGH (ref 0.0–0.5)
Eosinophils Relative: 7 %
HCT: 28.3 % — ABNORMAL LOW (ref 36.0–46.0)
Hemoglobin: 9.1 g/dL — ABNORMAL LOW (ref 12.0–15.0)
Immature Granulocytes: 1 %
Lymphocytes Relative: 7 %
Lymphs Abs: 1.3 10*3/uL (ref 0.7–4.0)
MCH: 28.7 pg (ref 26.0–34.0)
MCHC: 32.2 g/dL (ref 30.0–36.0)
MCV: 89.3 fL (ref 80.0–100.0)
Monocytes Absolute: 0.9 10*3/uL (ref 0.1–1.0)
Monocytes Relative: 6 %
Neutro Abs: 13.5 10*3/uL — ABNORMAL HIGH (ref 1.7–7.7)
Neutrophils Relative %: 79 %
Platelets: 52 10*3/uL — ABNORMAL LOW (ref 150–400)
RBC: 3.17 MIL/uL — ABNORMAL LOW (ref 3.87–5.11)
RDW: 20.1 % — ABNORMAL HIGH (ref 11.5–15.5)
WBC: 17.1 10*3/uL — ABNORMAL HIGH (ref 4.0–10.5)
nRBC: 4.4 % — ABNORMAL HIGH (ref 0.0–0.2)

## 2022-01-09 LAB — TYPE AND SCREEN
ABO/RH(D): O POS
Antibody Screen: NEGATIVE
Unit division: 0
Unit division: 0
Unit division: 0
Unit division: 0
Unit division: 0
Unit division: 0
Unit division: 0
Unit division: 0
Unit division: 0
Unit division: 0
Unit division: 0
Unit division: 0
Unit division: 0
Unit division: 0

## 2022-01-09 LAB — GLUCOSE, CAPILLARY
Glucose-Capillary: 235 mg/dL — ABNORMAL HIGH (ref 70–99)
Glucose-Capillary: 219 mg/dL — ABNORMAL HIGH (ref 70–99)
Glucose-Capillary: 204 mg/dL — ABNORMAL HIGH (ref 70–99)
Glucose-Capillary: 245 mg/dL — ABNORMAL HIGH (ref 70–99)
Glucose-Capillary: 234 mg/dL — ABNORMAL HIGH (ref 70–99)
Glucose-Capillary: 220 mg/dL — ABNORMAL HIGH (ref 70–99)

## 2022-01-09 LAB — BPAM RBC
Blood Product Expiration Date: 202302222359
Blood Product Expiration Date: 202302272359
Blood Product Expiration Date: 202303102359
Blood Product Expiration Date: 202303102359
Blood Product Expiration Date: 202303102359
Blood Product Expiration Date: 202303102359
Blood Product Expiration Date: 202303102359
Blood Product Expiration Date: 202303102359
Blood Product Expiration Date: 202303112359
Blood Product Expiration Date: 202303112359
Blood Product Expiration Date: 202303122359
Blood Product Expiration Date: 202303122359
Blood Product Expiration Date: 202303122359
Blood Product Expiration Date: 202303122359
ISSUE DATE / TIME: 202302100849
ISSUE DATE / TIME: 202302100849
ISSUE DATE / TIME: 202302100849
ISSUE DATE / TIME: 202302100849
ISSUE DATE / TIME: 202302101412
ISSUE DATE / TIME: 202302101412
ISSUE DATE / TIME: 202302102257
ISSUE DATE / TIME: 202302102257
ISSUE DATE / TIME: 202302110519
ISSUE DATE / TIME: 202302111153
Unit Type and Rh: 5100
Unit Type and Rh: 5100
Unit Type and Rh: 5100
Unit Type and Rh: 5100
Unit Type and Rh: 5100
Unit Type and Rh: 5100
Unit Type and Rh: 5100
Unit Type and Rh: 5100
Unit Type and Rh: 5100
Unit Type and Rh: 5100
Unit Type and Rh: 5100
Unit Type and Rh: 5100
Unit Type and Rh: 5100
Unit Type and Rh: 5100

## 2022-01-09 LAB — COMPREHENSIVE METABOLIC PANEL
ALT: 6 U/L (ref 0–44)
AST: 50 U/L — ABNORMAL HIGH (ref 15–41)
Albumin: 3.1 g/dL — ABNORMAL LOW (ref 3.5–5.0)
Alkaline Phosphatase: 84 U/L (ref 38–126)
Anion gap: 10 (ref 5–15)
BUN: 16 mg/dL (ref 8–23)
CO2: 22 mmol/L (ref 22–32)
Calcium: 8.7 mg/dL — ABNORMAL LOW (ref 8.9–10.3)
Chloride: 102 mmol/L (ref 98–111)
Creatinine, Ser: 2.15 mg/dL — ABNORMAL HIGH (ref 0.44–1.00)
GFR, Estimated: 25 mL/min — ABNORMAL LOW (ref >60)
Glucose, Bld: 255 mg/dL — ABNORMAL HIGH (ref 70–99)
Potassium: 4.2 mmol/L (ref 3.5–5.1)
Sodium: 134 mmol/L — ABNORMAL LOW (ref 135–145)
Total Bilirubin: 0.9 mg/dL (ref 0.3–1.2)
Total Protein: 5.6 g/dL — ABNORMAL LOW (ref 6.5–8.1)

## 2022-01-09 LAB — APTT: aPTT: 41 seconds — ABNORMAL HIGH (ref 24–36)

## 2022-01-09 LAB — ECHO INTRAOPERATIVE TEE
Height: 59.5 in
S' Lateral: 4.4 cm
Weight: 2624.36 oz

## 2022-01-09 LAB — COOXEMETRY PANEL
Carboxyhemoglobin: 1.6 % — ABNORMAL HIGH (ref 0.5–1.5)
Carboxyhemoglobin: 1.5 % (ref 0.5–1.5)
Methemoglobin: 0.9 % (ref 0.0–1.5)
Methemoglobin: 1.5 % (ref 0.0–1.5)
O2 Saturation: 73.2 %
O2 Saturation: 71.3 %
Total hemoglobin: 9.6 g/dL — ABNORMAL LOW (ref 12.0–16.0)
Total hemoglobin: 9.3 g/dL — ABNORMAL LOW (ref 12.0–16.0)

## 2022-01-09 LAB — RENAL FUNCTION PANEL
Albumin: 3.1 g/dL — ABNORMAL LOW (ref 3.5–5.0)
Anion gap: 11 (ref 5–15)
BUN: 14 mg/dL (ref 8–23)
CO2: 22 mmol/L (ref 22–32)
Calcium: 8.7 mg/dL — ABNORMAL LOW (ref 8.9–10.3)
Chloride: 104 mmol/L (ref 98–111)
Creatinine, Ser: 1.82 mg/dL — ABNORMAL HIGH (ref 0.44–1.00)
GFR, Estimated: 30 mL/min — ABNORMAL LOW (ref >60)
Glucose, Bld: 263 mg/dL — ABNORMAL HIGH (ref 70–99)
Phosphorus: 2.8 mg/dL (ref 2.5–4.6)
Potassium: 4.2 mmol/L (ref 3.5–5.1)
Sodium: 137 mmol/L (ref 135–145)

## 2022-01-09 LAB — MAGNESIUM: Magnesium: 2.5 mg/dL — ABNORMAL HIGH (ref 1.7–2.4)

## 2022-01-09 LAB — PROTIME-INR
INR: 1.7 — ABNORMAL HIGH (ref 0.8–1.2)
Prothrombin Time: 20.1 seconds — ABNORMAL HIGH (ref 11.4–15.2)

## 2022-01-09 MED ORDER — INSULIN ASPART 100 UNIT/ML IJ SOLN
4.0000 [IU] | INTRAMUSCULAR | Status: DC
  Administered 2022-01-09 – 2022-01-16 (×16): 4 [IU] via SUBCUTANEOUS

## 2022-01-09 MED ORDER — SODIUM CHLORIDE 0.9 % IV SOLN
0.0000 ug/min | INTRAVENOUS | Status: DC
  Administered 2022-01-10: 8 ug/min via INTRAVENOUS
  Administered 2022-01-11 – 2022-01-12 (×2): 6 ug/min via INTRAVENOUS
  Administered 2022-01-13: 16:00:00 8 ug/min via INTRAVENOUS
  Administered 2022-01-14: 6 ug/min via INTRAVENOUS
  Administered 2022-01-15: 5 ug/min via INTRAVENOUS
  Administered 2022-01-18 – 2022-01-24 (×4): 3 ug/min via INTRAVENOUS
  Administered 2022-01-26: 03:00:00 6 ug/min via INTRAVENOUS
  Administered 2022-01-27: 10:00:00 4 ug/min via INTRAVENOUS
  Administered 2022-01-28: 5 ug/min via INTRAVENOUS
  Filled 2022-01-09 (×15): qty 10

## 2022-01-09 MED ORDER — OXYCODONE HCL 5 MG PO TABS
5.0000 mg | ORAL_TABLET | ORAL | Status: DC | PRN
  Administered 2022-01-09 (×2): 10 mg
  Administered 2022-01-11: 13:00:00 5 mg
  Administered 2022-01-12 – 2022-01-14 (×4): 10 mg
  Administered 2022-01-15: 15:00:00 5 mg
  Administered 2022-01-15 – 2022-01-17 (×3): 10 mg
  Administered 2022-01-18: 5 mg
  Administered 2022-01-19 – 2022-01-20 (×5): 10 mg
  Administered 2022-01-21 – 2022-01-24 (×3): 5 mg
  Administered 2022-01-24: 10 mg
  Administered 2022-01-24 – 2022-01-26 (×3): 5 mg
  Administered 2022-01-27 – 2022-02-02 (×6): 10 mg
  Filled 2022-01-09: qty 2
  Filled 2022-01-09: qty 1
  Filled 2022-01-09 (×3): qty 2
  Filled 2022-01-09: qty 1
  Filled 2022-01-09 (×5): qty 2
  Filled 2022-01-09: qty 1
  Filled 2022-01-09: qty 2
  Filled 2022-01-09 (×2): qty 1
  Filled 2022-01-09: qty 2
  Filled 2022-01-09: qty 1
  Filled 2022-01-09 (×8): qty 2
  Filled 2022-01-09 (×3): qty 1
  Filled 2022-01-09: qty 2
  Filled 2022-01-09: qty 1
  Filled 2022-01-09 (×2): qty 2

## 2022-01-09 MED ORDER — RENA-VITE PO TABS
1.0000 | ORAL_TABLET | Freq: Every day | ORAL | Status: DC
  Administered 2022-01-09 – 2022-02-03 (×26): 1
  Filled 2022-01-09 (×26): qty 1

## 2022-01-09 MED ORDER — TRAMADOL HCL 50 MG PO TABS
50.0000 mg | ORAL_TABLET | Freq: Two times a day (BID) | ORAL | Status: DC | PRN
  Administered 2022-01-11: 100 mg
  Administered 2022-01-11: 50 mg
  Administered 2022-01-14 – 2022-01-15 (×2): 100 mg
  Administered 2022-01-18: 12:00:00 50 mg
  Administered 2022-01-19: 100 mg
  Administered 2022-01-26: 50 mg
  Administered 2022-01-27 – 2022-01-31 (×3): 100 mg
  Filled 2022-01-09: qty 2
  Filled 2022-01-09: qty 1
  Filled 2022-01-09 (×4): qty 2
  Filled 2022-01-09 (×2): qty 1
  Filled 2022-01-09 (×3): qty 2

## 2022-01-09 MED ORDER — VITAL HIGH PROTEIN PO LIQD
1000.0000 mL | ORAL | Status: DC
  Administered 2022-01-09 – 2022-01-11 (×2): 1000 mL

## 2022-01-09 MED ORDER — INSULIN DETEMIR 100 UNIT/ML ~~LOC~~ SOLN
35.0000 [IU] | Freq: Two times a day (BID) | SUBCUTANEOUS | Status: DC
  Administered 2022-01-09 – 2022-01-10 (×3): 35 [IU] via SUBCUTANEOUS
  Filled 2022-01-09 (×7): qty 0.35

## 2022-01-09 NOTE — Discharge Instructions (Signed)

## 2022-01-09 NOTE — Plan of Care (Signed)
°  Problem: Education: Goal: Knowledge of General Education information will improve Description: Including pain rating scale, medication(s)/side effects and non-pharmacologic comfort measures Outcome: Progressing   Problem: Health Behavior/Discharge Planning: Goal: Ability to manage health-related needs will improve Outcome: Progressing   Problem: Clinical Measurements: Goal: Ability to maintain clinical measurements within normal limits will improve Outcome: Progressing Goal: Will remain free from infection Outcome: Progressing Goal: Diagnostic test results will improve Outcome: Progressing Goal: Respiratory complications will improve Outcome: Progressing Goal: Cardiovascular complication will be avoided Outcome: Progressing   Problem: Activity: Goal: Risk for activity intolerance will decrease Outcome: Progressing   Problem: Nutrition: Goal: Adequate nutrition will be maintained Outcome: Progressing   Problem: Coping: Goal: Level of anxiety will decrease Outcome: Progressing   Problem: Elimination: Goal: Will not experience complications related to bowel motility Outcome: Progressing Goal: Will not experience complications related to urinary retention Outcome: Progressing   Problem: Pain Managment: Goal: General experience of comfort will improve Outcome: Progressing   Problem: Safety: Goal: Ability to remain free from injury will improve Outcome: Progressing   Problem: Skin Integrity: Goal: Risk for impaired skin integrity will decrease Outcome: Progressing   Problem: Education: Goal: Will demonstrate proper wound care and an understanding of methods to prevent future damage Outcome: Progressing Goal: Knowledge of disease or condition will improve Outcome: Progressing Goal: Knowledge of the prescribed therapeutic regimen will improve Outcome: Progressing Goal: Individualized Educational Video(s) Outcome: Progressing   Problem: Activity: Goal: Risk for  activity intolerance will decrease Outcome: Progressing   Problem: Cardiac: Goal: Will achieve and/or maintain hemodynamic stability Outcome: Progressing   Problem: Clinical Measurements: Goal: Postoperative complications will be avoided or minimized Outcome: Progressing   Problem: Respiratory: Goal: Respiratory status will improve Outcome: Progressing   Problem: Skin Integrity: Goal: Wound healing without signs and symptoms of infection Outcome: Progressing Goal: Risk for impaired skin integrity will decrease Outcome: Progressing

## 2022-01-09 NOTE — Progress Notes (Signed)
NAME:  Jody Nguyen, MRN:  [id number], DOB:  1956-01-19, LOS: 40 ADMISSION DATE:  01/20/2022, CONSULTATION DATE:  01/13/2022 REFERRING MD:  Martie Lee, TRH, CHIEF COMPLAINT:  SOB   History of Present Illness:  66 yo F PMH ESRD, non-obstructive CAD, suspected pulmonary hypertension, Hx PE on eliquis who presented to [facility] ED 2/4 with chest pain, which radiates to arm. Similar sx had also promoted ED presentation on 2/1 at which time trops were 100. She was seen by cardiology outpt on 2/2 and chest pain was felt to be related to volume shift as pain had occurred during dialysis.   On 2/4 presentation, Pt states that episodic chest pain has been intermittent for the past week. Non-exertional, lasts approx 10 minutes. Associated nausea and SOB. Non-specific EKG changes  & was d/w cardiology, ruled out for STEMI. Trop 200. Placed on [place] for SpO2 80s.Admitted to [facility] for obvs with concern for PE and started on heparin gtt. Awaiting VQ scan. ECHO has been performed which reveals Severe RV RA enlargement, severely reduced RV function and septal bowing, felt consistent with chronic process such as severe pulmonary HTN.    PCCM has been consulted in this setting   Pertinent  Medical History  ESRD PE Pulmonary HTN  CAD CVA HLD HTN S/p MV repair  Pericardial effusion r/t minoxidil  Diastolic HF DM2  Significant Hospital Events: Including procedures, antibiotic start and stop dates in addition to other pertinent events   2/4 ED presentation with recurring chest pain -- STEMI ruled out. Stable, mildly hypoxic and placed on [place] 2/5 admit to [facility] for obvs. Concern for PE, ECHO performed by cardiology reportedly shows RV failure, but felt more consistent with chronic process like severe pulmHTN. Awaiting VQ scan. Pulm consulted  2/7 cardiac cath. 3 vessel dz. CABG rec 2/10 CABG, TVR 2/11 continued vasoplegic shock with very high vasopressor requirements  2/12 Given methylene blue x 2  Interim History /  Subjective:   Decreasing vasopressor requirements, able to pull fluid with CRRT.  Objective   Blood pressure (!) 113/54, pulse (!) 111, temperature (!) 94.1 F (34.5 C), resp. rate (!) 8, height 4' 11.5" (1.511 m), weight 81.2 kg, SpO2 100 %. CVP:  [15 mmHg-31 mmHg] 23 mmHg  Vent Mode: SIMV;PRVC;PSV FiO2 (%):  [40 %] 40 % Set Rate:  [26 bmp] 26 bmp Vt Set:  [440 mL] 440 mL PEEP:  [5 cmH20-8 cmH20] 5 cmH20 Pressure Support:  [10 cmH20] 10 cmH20 Plateau Pressure:  [10 cmH20-20 cmH20] 20 cmH20   Intake/Output Summary (Last 24 hours) at 01/09/2022 1013 Last data filed at 01/09/2022 1000 Gross per 24 hour  Intake 3053.09 ml  Output 4415 ml  Net -1361.91 ml    Filed Weights   01/05/22 2015 01/07/22 0600 01/08/22 0600  Weight: 74.4 kg 83.2 kg 81.2 kg    Examination: General: overweight middle aged female, short stature  HEENT: OGT and ETT in place.  Facial edema has improved markedly  Neuro: will awake and follow commands with no focal deficits. On low-dose dexmedetomidine. CV: regular, tachy. HS normal.  Minimal chest tube drainage - thin sero-sanguinous fluid.   PULM:  chest clear. On 0.4/5  GI: soft, non-distended Extremities: warm/dry, anasarca much improved.   Ancillary tests personally reviewed:   CXR: bilateral interstitial pattern, post operative changes.  WBC: 17.1  Plt: 52  Creatinine 2.15 with CRRT HB: 9.1 ScvO2 73%  Assessment & Plan:   Coronary artery disease: 3 vessel disease diagnosed on cardiac  cath 2/7. S/p CABG 2/10. - management per cardiology, CVTS Cardiogenic shock: in the post cardiac surgery setting.  Acute respiratory failure with hypoxia  Pulmonary HTN  Acute on chronic systolic/diastolic CHF Tricuspid regurgitation, severe: now status post tricuspid valve repair S/p MVR 2016 ESRD HLD DM2 Hx PE  Thrombocytopenia - HITT vs CPB related.    Plan:   - Ongoing profound vasoplegia is the barrier to clinical progress. Now improving - Attempt  wean vasopressors starting with NE. Once NE < 33mcg/min, then wean vasopressin.  Keep MAP >65 - Titrate Epinephrine to keep CI>2.5. Will start to wean milrinone as  - Initiate CRRT for HD and HF of cytokines to potentially reduce vasopressor requirements.   - UF to remove 100 net/h. - Continue full ventilator support with lung protective strategy - if continues to tolerate pressor weaning and fluid removal, start SBT tomorrow. - Hold heparin, follow platelet count and sent SRA   Best Practice (right click and "Reselect all SmartList Selections" daily)   Diet/type: tubefeeds  - increase to goal rate.  DVT prophylaxis: prophylactic heparin - on hold due to thrombocytopenia.  GI prophylaxis: PPI Lines: Central line, Dialysis Catheter, and Arterial Line Foley:  Yes, and it is still needed Code Status:  full code Last date of multidisciplinary goals of care discussion [Family updated 2/10]  CRITICAL CARE Performed by: Kipp Brood  Total critical care time: 40 minutes  Critical care time was exclusive of separately billable procedures and treating other patients.  Critical care was necessary to treat or prevent imminent or life-threatening deterioration.  Critical care was time spent personally by me on the following activities: development of treatment plan with patient and/or surrogate as well as nursing, discussions with consultants, evaluation of patient's response to treatment, examination of patient, obtaining history from patient or surrogate, ordering and performing treatments and interventions, ordering and review of laboratory studies, ordering and review of radiographic studies, pulse oximetry, re-evaluation of patient's condition and participation in multidisciplinary rounds.  Kipp Brood, MD [facility] ICU Physician [place]  Pager: [phone] Mobile: [phone] After hours: [phone].   01/09/2022 10:13 AM

## 2022-01-09 NOTE — Progress Notes (Signed)
Initial Nutrition Assessment  DOCUMENTATION CODES:  Not applicable  INTERVENTION:  Continue tube feeding via OGT. Advance to goal rate as pt has tolerated trickles. Vital High Protein at 45 ml/h (1080 ml per day) Standard free water flushes of 40mL q4h Provides 1080 kcal, 94 gm protein, 1083 ml free water daily (TF+flush) Renavite via tube daily  NUTRITION DIAGNOSIS:  Inadequate oral intake related to inability to eat as evidenced by NPO status.  GOAL:  Provide needs based on ASPEN/SCCM guidelines  MONITOR:  TF tolerance, Vent status, I & O's, Labs  REASON FOR ASSESSMENT:  Consult Enteral/tube feeding initiation and management  ASSESSMENT:  66 y.o. female with history of HTN, DM type 2, HKD, pulmonary HTN, ESRD on HD, CHF, hx CVA, and vitamin D deficiency, presented to ED with radiating chest pain, SOB, and nausea.   Extensive cardiac workup this admission.   2/7 - left heart catherization, showed severe coronary calcification and multivessel CAD. 2/10 - CABGx3, TVR 2/11 - CRRT initated  Patient is currently intubated on ventilator support. OG tube in place confirmed gastric with imaging. TF protocol initiated and Vital High Protein currently infusing at trickle. Pt receiving pressor support x3, infusion rates stable/declining over the course of the morning. Discussed tolerance with RN, will advance to goal.  Husband in room at the time of assessment. Unsure of pt's usual dry weight as pt typically drives herself to and from her HD appointments. Does still make urine. However, does report that pt has a great appetite at home. Husband reports that he prepares the majority of meals at home.   On exam, pt is heavily edematous. Difficult to tell if muscle wasting is present. Significant swelling still noted to the right arm at fistula site.  MV: 11.8 L/min Temp (24hrs), Avg:96.8 F (36 C), Min:94.1 F (34.5 C), Max:98.2 F (36.8 C)   Intake/Output Summary (Last 24 hours) at  01/09/2022 1220 Last data filed at 01/09/2022 1000 Gross per 24 hour  Intake 2776.53 ml  Output 4495 ml  Net -1718.47 ml  Net IO Since Admission: -1,702.34 mL [01/09/22 1220]  Average Meal Intake: 2/5-2/9: 69% intake x 4 recorded meals  Nutritionally Relevant Medications: Scheduled Meds:  bisacodyl  10 mg Oral Daily   docusate  200 mg Per Tube Daily   doxercalciferol  9 mcg Intravenous Q M,W,F-HD   VITAL HIGH PROTEIN  1,000 mL Per Tube Q24H   insulin aspart  1-3 Units Subcutaneous Q4H   insulin detemir  35 Units Subcutaneous Q12H   pantoprazole sodium  40 mg Per Tube Daily   rosuvastatin  20 mg Per Tube QHS   Continuous Infusions:  epinephrine 8.5 mcg/min (01/09/22 0800)   norepinephrine (LEVOPHED) infusion 28 mcg/min (01/09/22 0800)   vasopressin 0.04 Units/min (01/09/22 0800)   PRN Meds: ondansetron  Labs Reviewed: Creatinine 2.15 Mg 2.5 SBG ranges from 198-245 mg/dL over the last 24 hours HgbA1c 5.6% (2/9)  NUTRITION - FOCUSED PHYSICAL EXAM: Flowsheet Row Most Recent Value  Orbital Region No depletion  Upper Arm Region No depletion  Thoracic and Lumbar Region No depletion  Buccal Region No depletion  Temple Region No depletion  Clavicle Bone Region No depletion  Clavicle and Acromion Bone Region No depletion  Scapular Bone Region No depletion  Dorsal Hand Unable to assess  Patellar Region No depletion  Anterior Thigh Region No depletion  Posterior Calf Region No depletion  Edema (RD Assessment) Severe  Hair Unable to assess  [hair wrap in place]  Eyes  Unable to assess  Mouth Unable to assess  Skin Reviewed  Nails Reviewed   Diet Order:   Diet Order     None       EDUCATION NEEDS:  Not appropriate for education at this time  Skin:  Skin Assessment: Reviewed RN Assessment (surgical incisions to the chest and right leg)  Last BM:  2/9  Height:  Ht Readings from Last 1 Encounters:  01/01/22 4' 11.5" (1.511 m)    Weight:  Wt Readings from Last  1 Encounters:  01/08/22 81.2 kg    Ideal Body Weight:  45.5 kg  BMI:  Body mass index is 35.55 kg/m.  Estimated Nutritional Needs:  Kcal:  [phone] kcal/d Protein:  90-115 g/d Fluid:  1L+UOP   Ranell Patrick, RD, LDN Clinical Dietitian RD pager # available in [facility]  After hours/weekend pager # available in [facility]

## 2022-01-09 NOTE — Discharge Summary (Incomplete)
Physician Discharge Summary       [address]       [place] [address]             [phone]    Patient ID: TENAYA HILYER MRN: [id number] DOB/AGE: 07-03-1956 66 y.o.  Admit date: 01/15/2022 Discharge date: 01/27/2022  Admission Diagnoses: Coronary artery disease  Discharge Diagnoses:  S/p CABG x 3, tricuspid valve repair 2. Thrombocytopenia 3. History of the following:   Essential hypertension   Hyperlipidemia associated with type 2 diabetes mellitus (HCC)   Anemia of chronic renal failure   Type 2 diabetes mellitus (HCC)   Obesity (BMI 30-39.9)   Pulmonary hypertension (HCC)   Chronic combined systolic and diastolic heart failure (HCC)   S/P minimally invasive mitral valve repair   End stage renal disease on dialysis ([place])   History of stroke   QT prolongation   SVT (supraventricular tachycardia) (HCC)   GERD (gastroesophageal reflux disease)   Consults: pulmonary/intensive care, nephrology, and vascular surgery, and advanced heart failure  Procedure (s):  Median sternotomy, extracorporeal circulation, coronary artery bypass grafting x 3 (left internal mammary artery to LAD, saphenous vein graft to diagonal, saphenous vein graft to posterior descending), tricuspid valve annuloplasty with 36 mm  Edwards MC3 ring, closure of patent foramen ovale.  Endoscopic vein harvest, right leg by Dr. Roxan Hockey on 01/05/2022.  History of Present Illness:  Sheelah Ritacco is a 66 year old female who recently presented on several occasions to the ER with chest pain.  On 01/11/2022 she presented to the ER with symptoms of chest pain with radiation to both arms.  She has had multiple episodes over the previous several days.  She has a history of end-stage renal disease on hemodialysis.  Pain was worse during dialysis treatment.  He had a episode of sinus tachycardia while in the emergency room.  She has a history of pulmonary hypertension and is followed by [facility].  There is associated chronic diastolic heart failure and she is known to have undergone a previous mitral valve repair in 2016 by Dr. Roxy Manns.  At that time she had nonobstructive coronary artery disease per catheterization.  She has multiple other medical risk factors including hypertension, hyperlipidemia, type 2 diabetes and a previous history of pulmonary embolism on Eliquis.  High-sensitivity troponins were elevated in the ER.  She was COVID and flu negative.  Chest x-ray showed no active disease.  He received a fluid bolus for stabilization and heart rate improved from the episode of tachycardia.  She was felt to require admission for further monitoring and stabilization.  She is noted to have had a nuclear study in October 2022 showing no ischemia.  It is noted that her mitral valve repair in 7062 was complicated by hypoxic respiratory failure requiring ECMO with transfer to [facility] and eventual tracheostomy.  At this time she developed the renal failure.  VQ scan was low risk for pulmonary embolism.  A repeat echocardiogram was performed on 01/02/2022 with the full report listed below.  Ejection fraction was estimated to be 40 to 45% with mildly reduced LV function.  The right ventricular systolic function was mildly reduced.  There was some enlargement of the right ventricle and elevated pulmonary artery pressures were mild.  There is no evidence for mitral valve regurgitation but there was moderate mitral stenosis.  The tricuspid valve showed severe regurgitation and moderate stenosis.  The aortic valve was unremarkable.  Cardiac catheterization was  performed today that shows severe 99% proximal LAD disease, 90% first diagonal lesion, ostial circumflex to proximal circumflex lesion with 100% stenosis and a proximal RCA lesion with 100% stenosis.  Coronaries are noted to be severely calcified.  LVEDP was 23.  We are asked to see the patient for consideration of CABG due  to the severity of the anatomy and significant symptoms.  Hospital Course:  The patient was evaluated by Dr. Roxan Hockey who felt patient would benefit from coronary artery bypass grafting procedure and possible repair of Tricuspid Valve.  He felt the patient would be very high risk with previous cardiac surgery with complications requiring ECMO and long hospitalization previously.  The patient and her family understood the risk and were agreeable to proceed.  She was taken to the operating room and underwent CABG x 3 utilizing LIMA to LAD, SVG to Diagonal, and SVG to PDA.   She also underwent closure of Patent Foramen Ovale, Tricuspid Valve Annuloplasty, and endoscopic harvest of greater saphenous vein from her right leg.  She tolerated the procedure and was taken to the SICU in stable condition. She was on Milrinone, Epinephrine, Nor Epinephrine, and Vasopressin drips. She remained intubated but gradually less sedated. Pulmonary/CCM managed vent. She was able to respond to questions and commands. She tolerated the procedure and was taken to the SICU in stable condition. She was on Milrinone, Epinephrine, Nor Epinephrine, and Vasopressin drips. She remained intubated but gradually less sedated. Pulmonary/CCM managed vent. She was able to respond to questions and commands. With her history of ESRD, nephrology continued to follow post op and arrange for CRRT accordingly. She was started on tube feedings for nutritional support. Per pulmonary, try SBT today. Tube feedings were held on 02/14 secondary to emesis. Regarding her rhythm, she was sinus tachycardia 02/15. Pressors continued to be weaned as able.  She had thrombocytopenia post op. Her platelet count went as low as 49,000. HIT was checked and was negative. Her WBC count went up to 23,000 on 02/15. She remained afebrile and no sign of wound infection. She has been on Solu cortef so was monitored closely. Patient was successfully extubated 02/16. Patient  had no thrill or bruit in right forearm fistula 02/16. It had previously been infiltrated but was now thrombosed. Per vascular surgery, patient will need new access once more stable. She received CRRT via left femoral temporary HD catheter. Tube feedings were resumed as she had no further emesis. Nephrology followed patient post op and arranged for CRRT accordingly. She was put on Bivalirudin as she has a history of PE. Swallow study was ordered 02/18. Speech pathology recommend continuing cortrak for primary nutrition, allowing ice chips, sips of honey-thick liquids and bites of applesauce from floor stock for pleasure. Speech pathology continued to follow patient and recommendations were followed accordingly. Pulmonary/CCM placed new lines on 02/18 secondary to elevated WBC and fever. She was put on Vancomycin and Zosyn. Blood culture showed Morganella Morganii. She was then put on Ceftriaxone. Co ox decreased so Milrinone was restarted on 02/19. Echo was done on 02/19 that showed severe RV dysfunction. Platelet count continued to increase and was up to 154,000. Patient was very deconditioned so PT ordered. She went into a fib with RVR last evening 02/21. She was put on IV Amiodarone the am of 02/22. Her H and H on 02/25 was decreased to 7.1 and 23.5. She was transfused. Patient was more interactive and alert over this past weekend. She was put on Macitentan for PAH.  She will have Swan placed and RHC in am.   Latest Vital Signs: Blood pressure 106/63, pulse 82, temperature (!) 97.2 F (36.2 C), resp. rate (!) 32, height 4' 11.5" (1.511 m), weight 70.8 kg, SpO2 94 %.  Physical Exam:***  Discharge Condition:***  Recent laboratory studies:  Lab Results  Component Value Date   WBC 13.3 (H) 01/27/2022   HGB 7.7 (L) 01/27/2022   HCT 24.9 (L) 01/27/2022   MCV 99.2 01/27/2022   PLT 141 (L) 01/27/2022   Lab Results  Component Value Date   NA 135 01/27/2022   K 4.2 01/27/2022   CL 102 01/27/2022    CO2 25 01/27/2022   CREATININE 1.12 (H) 01/27/2022   GLUCOSE 72 01/27/2022      Diagnostic Studies: DG Abd 1 View  Result Date: 01/24/2022 CLINICAL DATA:  66 year old female feeding tube placement. EXAM: ABDOMEN - 1 VIEW COMPARISON:  01/11/2022 and earlier. FINDINGS: Portable AP view at 0627 hours. Enteric feeding tube courses through the stomach and terminates in the midline at the level of the gastric antrum similar to earlier this month. Retained barium type contrast in the stomach, nondilated small bowel loops, and some colon. Improved left lung base ventilation with continued tenting of the left hemidiaphragm. Bilateral femoral approach vascular catheters. No acute osseous abnormality identified. IMPRESSION: 1. Enteric feeding tube tip at the level of the gastric antrum similar to earlier this month. 2. Nonobstructed bowel pattern with retained barium type contrast in the stomach and multiple loops. Electronically Signed   By: Genevie Ann M.D.   On: 01/24/2022 08:45   CT SOFT TISSUE NECK WO CONTRAST  Result Date: 01/23/2022 CLINICAL DATA:  Soft tissue swelling EXAM: CT NECK WITHOUT CONTRAST TECHNIQUE: Multidetector CT imaging of the neck was performed following the standard protocol without intravenous contrast. RADIATION DOSE REDUCTION: This exam was performed according to the departmental dose-optimization program which includes automated exposure control, adjustment of the mA and/or kV according to patient size and/or use of iterative reconstruction technique. COMPARISON:  None. FINDINGS: PHARYNX AND LARYNX: The nasopharynx, oropharynx and larynx are normal. Visible portions of the oral cavity, tongue base and floor of mouth are normal. Normal epiglottis, vallecula and pyriform sinuses. The larynx is normal. No retropharyngeal abscess, effusion or lymphadenopathy. SALIVARY GLANDS: Normal parotid, submandibular and sublingual glands. THYROID: Normal. LYMPH NODES: No enlarged or abnormal density lymph  nodes. VASCULAR: Major cervical vessels are patent. LIMITED INTRACRANIAL: Normal. VISUALIZED ORBITS: Normal. MASTOIDS AND VISUALIZED PARANASAL SINUSES: Bilateral mastoid fluid. Visualized paranasal sinuses are unremarkable. SKELETON: No bony spinal canal stenosis. No lytic or blastic lesions. UPPER CHEST: Small left pleural effusion OTHER: Nasogastric tube courses below the field of view IMPRESSION: 1. No acute abnormality of the neck. 2. Small left pleural effusion. Electronically Signed   By: Ulyses Jarred M.D.   On: 01/23/2022 21:00   CARDIAC CATHETERIZATION  Result Date: 02/14/2022 Findings: On NE 2 EPi 3 Milrinone 0.25 VP 0.02 Ao = 104/48 (63) RA = 11 RV = 35/10 PA = 53/26 (34) PCW = 17 Fick cardiac output/index = 6.4/3.8 Thermal CO/CI = 5.2/3.1 PVR (Fick) = 2.7 PVR (Therm) = 3.3 SVR (Fick) = 650 SVR (Therm) = 807 Ao sat = 92% PA sat = 58%, 57% Assessment: Surprisingly well-compensated hemodynamics on 4 vasopressors/inotropes. Volume status much improved Plan/Discussion: Will wean pressors using swan guidance. Cut milrinone to 0.125. Wean NE next.  Keep VP for now with low SVR. Glori Bickers, MD 8:45 AM  CARDIAC CATHETERIZATION  Result  Date: 01/18/2022   Prox LAD lesion is 99% stenosed.   1st Diag lesion is 90% stenosed.   Ost Cx to Prox Cx lesion is 100% stenosed.   Prox RCA lesion is 100% stenosed. Severe coronary calcification and multivessel CAD. The LAD is 99% noticed after giving rise to a optional diagonal like vessel that has 90% ostial stenosis.  The mid LAD has 30 and 70% mid distal stenoses. The left circumflex vessel was totally occluded at the ostium.  There is septal to the distal circumflex collateralization with filling of the vessel up to the proximal AV groove. The right coronary artery is totally occluded proximally.  There is bridging collaterals filling the RCA from the RV marginal branch downward. LVEDP 23 mm. RECOMMENDATION: We will obtain surgical consultation for consideration  of CABG revascularization.  The patient has good targets.  Reportedly, the patient had no ischemia on a prior nuclear study and a lot of anatomical findings, question if there was balanced ischemia at the time of her prior study.  Will resume heparin 8 hours post procedure.  NM Pulmonary Perf and Vent  Result Date: 01/02/2022 CLINICAL DATA:  Pulmonary embolism (PE) suspected, high prob EXAM: NUCLEAR MEDICINE PERFUSION LUNG SCAN TECHNIQUE: Perfusion images were obtained in multiple projections after intravenous injection of radiopharmaceutical. Ventilation scans intentionally deferred if perfusion scan and chest x-ray adequate for interpretation during COVID 19 epidemic. RADIOPHARMACEUTICALS:  4.1 mCi Tc-45m MAA IV COMPARISON:  Chest x-ray 01/02/2022 FINDINGS: Distribution of radiopharmaceutical within both lung fields. Large area of central photopenia related to cardiomegaly. No segmental perfusion defect. IMPRESSION: Low probability for pulmonary embolism. Electronically Signed   By: Davina Poke D.O.   On: 01/02/2022 16:25   Port CXR  Result Date: 02/16/2022 CLINICAL DATA:  Central line placement EXAM: PORTABLE CHEST 1 VIEW COMPARISON:  01/21/2022 FINDINGS: Nasogastric tube projecting over the stomach. Femoral insertion of a Swan-Ganz catheter with the tip projecting over the left main pulmonary artery. Bilateral mild interstitial thickening. Left basilar airspace disease likely reflecting atelectasis. No pleural effusion or pneumothorax. Stable cardiomegaly. Prior CABG. Prior tricuspid and mitral valve replacement. No acute osseous abnormality. IMPRESSION: 1. Femoral insertion of a Swan-Ganz catheter with the tip projecting over the left main pulmonary artery. 2. Nasogastric tube projecting over the stomach. 3. Cardiomegaly with mild pulmonary vascular congestion. Electronically Signed   By: Kathreen Devoid M.D.   On: 01/27/2022 10:04   DG CHEST PORT 1 VIEW  Result Date: 01/21/2022 CLINICAL DATA:   Hypoxia EXAM: PORTABLE CHEST 1 VIEW COMPARISON:  01/18/2022 FINDINGS: Transverse diameter of heart is increased. There is prosthetic valve placement in the heart. There is also suggestion of coronary bypass surgery. Enteric tube is noted traversing the esophagus. Increased density in the left lower lung field obscuring the left hemidiaphragm and blunting of left lateral CP angle have not changed. IMPRESSION: Cardiomegaly. Increased density in the left lower lung fields may suggest pleural effusion and possibly atelectasis/pneumonia. No significant interval changes are noted. Electronically Signed   By: Elmer Picker M.D.   On: 01/21/2022 11:53   DG CHEST PORT 1 VIEW  Result Date: 01/18/2022 CLINICAL DATA:  66 year old female with shortness of breath, respiratory distress. EXAM: PORTABLE CHEST - 1 VIEW COMPARISON:  01/17/2022, 01/16/2022, 01/14/2022, 01/13/2022 FINDINGS: The mediastinal contours are unchanged. Unchanged cardiomegaly. Postsurgical changes after coronary artery bypass graft, mitral and tricuspid valve repair. Enteric tube terminates off the inferior aspect of this image, unchanged. Unchanged retrocardiac opacities and mild blunting of left costophrenic  angle. No pneumothorax. No acute osseous abnormality. Median sternotomy wires in place. IMPRESSION: 1. Similar appearing left basilar opacity and possible trace left pleural effusion. 2. Unchanged cardiomegaly. Electronically Signed   By: Ruthann Cancer M.D.   On: 01/18/2022 08:31   DG Chest Port 1 View  Result Date: 01/17/2022 CLINICAL DATA:  S/P CABG (coronary artery bypass graft) Z95.1 (ICD-10-CM) EXAM: PORTABLE CHEST 1 VIEW COMPARISON:  January 16, 2022. FINDINGS: Similar enlarged cardiac silhouette. Median sternotomy, tricuspid repair, and CABG. Probable small left pleural effusion. Similar left basilar opacities. Enteric tube courses below the diaphragm with the tip outside the field of view. IMPRESSION: 1. Similar probable small left  pleural effusion and overlying left basilar opacities, which could represent atelectasis and/or pneumonia. 2. Similar postoperative change and cardiomegaly. Electronically Signed   By: Margaretha Sheffield M.D.   On: 01/17/2022 08:20   DG Chest Port 1 View  Result Date: 01/16/2022 CLINICAL DATA:  Shortness of breath and chest pain EXAM: PORTABLE CHEST 1 VIEW COMPARISON:  01/14/2022 FINDINGS: Feeding tube extends beyond the inferior aspect of the film. Median sternotomy for CABG and tricuspid valve repair. Cardiomegaly accentuated by AP portable technique. Atherosclerosis in the transverse aorta. Probable small left pleural effusion. No pneumothorax. Low lung volumes with resultant pulmonary interstitial prominence. Persistent left mid and lower lung airspace disease. IMPRESSION: No significant change since the prior exam. Cardiomegaly and low lung volumes with probable small left pleural effusion and left base airspace disease. Most likely atelectasis. Aortic Atherosclerosis (ICD10-I70.0). Electronically Signed   By: Abigail Miyamoto M.D.   On: 01/16/2022 10:48   DG Chest Port 1 View  Result Date: 01/14/2022 CLINICAL DATA:  Sore chest EXAM: PORTABLE CHEST 1 VIEW COMPARISON:  Radiograph 01/13/2022 FINDINGS: Enteric tube passes below the diaphragm, tip excluded by collimation. Intact median sternotomy. Prior CABG, aortic valve replacement, and mitral annuloplasty. Unchanged, enlarged cardiac silhouette. Persistent perihilar and bibasilar airspace disease. Minimal central pulmonary vascular congestion. Small left effusion. No visible pneumothorax. No acute osseous abnormality. IMPRESSION: Postoperative chest with persistent perihilar and bibasilar airspace opacities, favored to be atelectasis, developing infection possible. Continued radiographic follow-up recommended. Electronically Signed   By: Maurine Simmering M.D.   On: 01/14/2022 08:20   DG CHEST PORT 1 VIEW  Addendum Date: 01/13/2022   ADDENDUM REPORT: 01/13/2022  12:42 ADDENDUM: The LEFT-sided catheter could potentially be directed into the IJ based on position, by report intended placement was subclavian. Position is uncertain. This could also potentially be within the soft tissues. Critical Value/emergent results were called by telephone at the time of interpretation on 01/13/2022 at 12:42 pm to provider [facility] , who verbally acknowledged these results. Electronically Signed   By: Zetta Bills M.D.   On: 01/13/2022 12:42   Result Date: 01/13/2022 CLINICAL DATA:  Central line placement. EXAM: PORTABLE CHEST 1 VIEW COMPARISON:  Chest x-ray from January 12, 2022. FINDINGS: A catheter projects over the LEFT chest. Tip is outside of the thoracic cavity. Catheter extends to the LEFT axilla projecting over the soft tissues. Gastric tube, feeding type tube courses through in off the field of the radiograph. EKG leads project over the chest. Post median sternotomy for valve replacement of mitral and tricuspid valves. Postoperative changes also of CABG. Cardiomediastinal contours remain enlarged. Increasing opacity at the LEFT lung base since prior imaging. No signs of pneumothorax. On limited assessment there is no acute skeletal process. IMPRESSION: 1. A catheter projects over the LEFT chest. Tip is outside of the thoracic cavity.  This may be within a peripheral venous structure based on position but is of uncertain significance. Correlate with injury site into the venous system and intended positioning. This is likely not within a central vein. 2. Catheter extends to the LEFT axilla projecting over the soft tissues. 3. Increasing opacity at the LEFT lung base since prior imaging, correlate with any signs of infection. 4. A call is out to the referring provider to further discuss findings in the above case. Electronically Signed: By: Zetta Bills M.D. On: 01/13/2022 12:18   DG Chest Port 1 View  Result Date: 01/12/2022 CLINICAL DATA:  Endotracheal tube present,  recent CABG. EXAM: PORTABLE CHEST 1 VIEW COMPARISON:  Chest radiograph dated January 11, 2022 FINDINGS: The heart is enlarged. Mitral and tricuspid annuloplasty. Atherosclerotic calcification of aortic arch. Lungs are clear without evidence of focal consolidation or large pleural effusion. Minimal left basilar atelectasis, likely postsurgical. Endotracheal tube with distal tip approximately 4.6 cm above the carina, unchanged. Left IJ access central line with distal tip near the brachiocephalic confluence, unchanged. Feeding tube with distal tip coursing below the diaphragm, distal tip not included. IMPRESSION: 1.  No acute cardiopulmonary process. 2.  Support lines are unchanged. 3. Stable cardiomegaly, mitral and tricuspid annuloplasty and postsurgical changes. Electronically Signed   By: Keane Police D.O.   On: 01/12/2022 08:30   DG Chest Port 1 View  Result Date: 01/11/2022 CLINICAL DATA:  Status post CABG.  On dialysis. EXAM: PORTABLE CHEST 1 VIEW COMPARISON:  Multiple priors including most recent January 10, 2022 FINDINGS: Left IJ vascular sheath with tip near the brachiocephalic confluence, unchanged. Endotracheal tube with tip projecting over the midthoracic trachea. Nasogastric tube coursing below the diaphragm with tip obscured by collimation. Surgical changes of median sternotomy and CABG are present and stable. Prior tricuspid and mitral valve annuloplasty. The cardiac enlargement similar prior. No focal airspace consolidation. No significant pleural effusion or visible pneumothorax. IMPRESSION: 1. Stable support devices as above. 2. No acute cardiopulmonary process. 3. Stable cardiomegaly and postsurgical changes of median sternotomy, CABG, and tricuspid and mitral valve annuloplasty. Electronically Signed   By: Dahlia Bailiff M.D.   On: 01/11/2022 08:14   DG Chest Port 1 View  Result Date: 01/10/2022 CLINICAL DATA:  Endotracheal tube, status post coronary artery bypass grafting. EXAM: PORTABLE  CHEST 1 VIEW COMPARISON:  None. FINDINGS: The heart is enlarged. Evidence of prior CABG. Tricuspid and mitral valve annuloplasty. Lungs are clear without evidence of focal consolidation or pleural effusion. Elevation of the right hemidiaphragm. Interval removal of the left-sided chest tube. No appreciable pneumothorax. Endotracheal tube with distal tip approximately 5 cm above the carina. Left IJ access central venous catheter is unchanged. NG tube with distal tip projecting over the stomach, however side port at the level of GE junction. IMPRESSION: 1.  Interval removal of the left-sided chest tube. 2. Feeding tube with side port at the level of the GE junction, it could be advanced 2-3 cm. 3.  No significant pleural effusion or pneumothorax. Electronically Signed   By: Keane Police D.O.   On: 01/10/2022 08:32   DG CHEST PORT 1 VIEW  Result Date: 01/09/2022 CLINICAL DATA:  CABG. EXAM: PORTABLE CHEST 1 VIEW COMPARISON:  Chest x-ray from yesterday. FINDINGS: Unchanged endotracheal and enteric tubes. Unchanged left internal jugular central venous catheter, mediastinal drain, and left chest tube. Stable cardiomegaly status post CABG, mitral valve annuloplasty, and tricuspid valve annuloplasty. Normal pulmonary vascularity. Unchanged mild bibasilar atelectasis. No focal consolidation, pleural  effusion, or pneumothorax. No acute osseous abnormality. IMPRESSION: 1. Stable lines and tubes. No active disease. Electronically Signed   By: Titus Dubin M.D.   On: 01/09/2022 08:01   DG CHEST PORT 1 VIEW  Result Date: 01/08/2022 CLINICAL DATA:  66 year old female status post CABG. EXAM: PORTABLE CHEST 1 VIEW COMPARISON:  Chest x-ray 01/07/2022. FINDINGS: An endotracheal tube is in place with tip 3.5 cm above the carina. Midline surgical drain may represent a mediastinal or pericardial drainage catheter. Left-sided chest tube in position with tip in the lateral aspect of the lower left hemithorax. A nasogastric tube is  seen extending into the stomach, however, the tip of the nasogastric tube extends below the lower margin of the image. Status post median sternotomy for CABG, tricuspid annuloplasty and mitral annuloplasty. Epicardial pacing wires are also noted. Lung volumes are slightly low. Bibasilar opacities are favored to reflect areas of postoperative subsegmental atelectasis. No definite acute consolidative airspace disease. No appreciable pneumothorax. No evidence of pulmonary edema. Mild enlargement of the cardiopericardial silhouette, stable. Upper mediastinal contours are within normal limits. Atherosclerotic calcifications in the thoracic aorta. IMPRESSION: 1. Postoperative changes and support apparatus, as above. 2. Low lung volumes with residual postoperative subsegmental atelectasis. 3. Aortic atherosclerosis. Electronically Signed   By: Vinnie Langton M.D.   On: 01/08/2022 08:23   DG Chest Port 1 View  Result Date: 01/07/2022 CLINICAL DATA:  Status post CABG surgery.  Follow-up exam. EXAM: PORTABLE CHEST 1 VIEW COMPARISON:  01/20/2022 and older exams. FINDINGS: Cardiac silhouette is enlarged, but stable. No mediastinal widening. Bilateral lung base opacities, partly obscuring the right and obscuring the left hemidiaphragms, consistent with a combination of atelectasis and pleural fluid. There is central vascular congestion with additional perihilar opacities that are likely atelectasis. Mild interstitial prominence bilaterally without overt edema. No pneumothorax. Left internal jugular small Ganz catheter has been removed since the previous exam. The introducer sheath remains in place. Endotracheal tube, nasal/orogastric tube, mediastinal tube and left inferior me thorax chest tube are stable in well positioned. IMPRESSION: 1. No acute findings or evidence of an operative complication. 2. Swan-Ganz catheter has been removed. Remaining support apparatus is stable and well positioned. 3. Persistent lung base  opacities consistent with combination of atelectasis and small effusions. Perihilar opacities consistent with atelectasis. Central vascular congestion without evidence of pulmonary edema. 4. No pneumothorax. Electronically Signed   By: Lajean Manes M.D.   On: 01/07/2022 16:44   DG Chest Port 1 View  Result Date: 01/22/2022 CLINICAL DATA:  Postoperative CABG EXAM: PORTABLE CHEST 1 VIEW COMPARISON:  01/02/2022 FINDINGS: Interval postoperative changes from CABG. Endotracheal tube terminates approximately 2.1 cm above the carina. Enteric tube courses below the diaphragm with distal tip beyond the inferior margin of the film. Left IJ approach Swan-Ganz catheter terminates within the right lower pulmonary outflow. Left basilar chest tube and mediastinal drain. Stable cardiomegaly. Ill-defined mediastinal structures is favored to be postoperative. Probable small right pleural effusion. Bibasilar atelectasis. No pneumothorax. IMPRESSION: 1. Interval postoperative changes from CABG. No pneumothorax. 2. Support lines and tubes as above. 3. Bibasilar atelectasis and probable small right pleural effusion. Electronically Signed   By: Davina Poke D.O.   On: 01/07/2022 17:48   DG Chest Port 1 View  Result Date: 01/02/2022 CLINICAL DATA:  Preop EXAM: PORTABLE CHEST 1 VIEW COMPARISON:  Chest x-ray dated December 31, 2021 FINDINGS: Unchanged enlarged cardiac and mediastinal contours. Lungs are clear. No large effusion or evidence of pneumothorax. IMPRESSION: Stable cardiomegaly with  no evidence of active disease. Electronically Signed   By: Yetta Glassman M.D.   On: 01/02/2022 08:17   DG Chest Port 1 View  Result Date: 01/20/2022 CLINICAL DATA:  Generalized chest pain and shortness of breath since yesterday. EXAM: PORTABLE CHEST 1 VIEW COMPARISON:  Chest x-rays dated 12/28/2021 and 12/20/2021. FINDINGS: Stable cardiomegaly. Lungs are clear. No pleural effusion or pneumothorax is seen. Osseous structures about the  chest are unremarkable. IMPRESSION: 1. No active disease. No evidence of pneumonia or pulmonary edema. 2. Stable cardiomegaly. Electronically Signed   By: Franki Cabot M.D.   On: 01/01/2022 19:07   DG Chest Port 1 View  Result Date: 12/28/2021 CLINICAL DATA:  Provided history: Chest pain.  History of CHF. EXAM: PORTABLE CHEST 1 VIEW COMPARISON:  Prior chest radiographs 12/20/2021 and earlier. FINDINGS: Unchanged cardiomegaly. Prosthetic cardiac valve. Aortic atherosclerosis. No appreciable airspace consolidation or pulmonary edema. No evidence of pleural effusion or pneumothorax. No acute bony abnormality identified. IMPRESSION: No evidence of acute cardiopulmonary abnormality. Unchanged cardiomegaly. Aortic Atherosclerosis (ICD10-I70.0). Electronically Signed   By: Kellie Simmering D.O.   On: 12/28/2021 11:09   DG Abd Portable 1V  Result Date: 01/11/2022 CLINICAL DATA:  Feeding tube placement EXAM: PORTABLE ABDOMEN - 1 VIEW COMPARISON:  None. FINDINGS: Feeding tube enters the stomach with the tip near the gastric antrum. Normal bowel gas pattern. Right femoral central venous catheter tip in the proximal IVC. Left femoral venous catheter in the left iliac vein. IMPRESSION: Feeding tube in the gastric antrum. Electronically Signed   By: Franchot Gallo M.D.   On: 01/11/2022 15:41   DG Swallowing Func-Speech Pathology  Result Date: 01/23/2022 Table formatting from the original result was not included. Objective Swallowing Evaluation: Type of Study: MBS-Modified Barium Swallow Study  Patient Details Name: KETINA MARS MRN: [id number] Date of Birth: [birth year] Today's Date: 01/23/2022 Time: SLP Start Time (ACUTE ONLY): 1609 -SLP Stop Time (ACUTE ONLY): 1628 SLP Time Calculation (min) (ACUTE ONLY): 19 min Past Medical History: Past Medical History: Diagnosis Date  Anemia   Chronic diastolic CHF (congestive heart failure) (HCC)   Chronic diastolic heart failure (HCC)   CKD (chronic kidney disease) stage 4, GFR  15-29 ml/min (HCC)   dialysis M/W/F  Constipation   CVA (cerebral infarction) 1997  no residual deficit  Diverticulitis   DM (diabetes mellitus) ([place])   type 2  Heart murmur   History of cardiovascular stress test   a. Myoview Oct 2012 showed EF 49%, no ischemia, LVE  HLD (hyperlipidemia)   takes Crestor daily  Hypertension   Pericardial effusion   chronic; felt to be poss related to minoxidil >> DC'd  Pulmonary hypertension ([place]) 06/18/2015  Respiratory failure, acute hypoxic, post-operative 07/08/2015  Requiring ECMO support  Respiratory failure, post-operative (HCC)   S/P cardiac catheterization   a. R/L HC 06/18/15:  mLAD 30%; severe pulmo HTN with PA sat 43%, CI 1.86, prominent V waves indicative of MR; resting hypoxemia O2 sat 86% on RA  S/P minimally invasive mitral valve repair 07/08/2015  Complex valvuloplasty including artificial Gore-tex neochord placement x10 and 26 mm Sorin Memo 3D Rechord ring annuloplasty via right mini thoracotomy approach  S/P minimally invasive mitral valve repair 07/08/2015  Complex valvuloplasty including artificial Gore-tex neochord placement x10 and 26 mm Sorin Memo 3D Rechord ring annuloplasty via right mini thoracotomy approach  Severe mitral regurgitation   Shortness of breath dyspnea   Stroke ([place]) 1997  no residual effect  Vitamin D deficiency  Wears glasses  Past Surgical History: Past Surgical History: Procedure Laterality Date  A/V FISTULAGRAM Right 08/16/2017  Procedure: A/V Fistulagram;  Surgeon: Conrad Gustine, MD;  Location: [facility];  Service: Cardiovascular;  Laterality: Right;  AV FISTULA PLACEMENT Left 10/22/2015  Procedure: RADIOCEPHALIC ARTERIOVENOUS (AV) FISTULA CREATION;  Surgeon: Serafina Mitchell, MD;  Location: [facility];  Service: Vascular;  Laterality: Left;  AV FISTULA PLACEMENT Right 05/22/2017  Procedure: ARTERIOVENOUS (AV) FISTULA CREATION-RIGHT ARM;  Surgeon: Waynetta Sandy, MD;  Location: [place];  Service: Vascular;  Laterality: Right;   CANNULATION FOR CARDIOPULMONARY BYPASS N/A 07/08/2015  Procedure: CANNULATION FOR ECMO;  Surgeon: Rexene Alberts, MD;  Location: [place];  Service: Open Heart Surgery;  Laterality: N/A;  CARDIAC CATHETERIZATION    CARDIAC CATHETERIZATION N/A 06/18/2015  Procedure: Right/Left Heart Cath and Coronary Angiography;  Surgeon: Jettie Booze, MD;  Location: [facility];  Service: Cardiovascular;  Laterality: N/A;  CESAREAN SECTION    x 2  COLONOSCOPY    CORONARY ARTERY BYPASS GRAFT N/A 12/29/2021  Procedure: CORONARY ARTERY BYPASS GRAFTING (CABG) TIMES THREE ON CARDIOPULMONARY BYPASS. LIMA TO LAD, SVG TO PD, SVG TO DIAG;  Surgeon: [name], MD;  Location: [place];  Service: Open Heart Surgery;  Laterality: N/A;  ENDOVEIN HARVEST OF GREATER SAPHENOUS VEIN Right 01/07/2022  Procedure: ENDOVEIN HARVEST OF GREATER SAPHENOUS VEIN;  Surgeon: [name], MD;  Location: [facility];  Service: Open Heart Surgery;  Laterality: Right;  RIGHT UPPER AND LOWER LEG  EXCHANGE OF A DIALYSIS CATHETER N/A 06/28/2017  Procedure: EXCHANGE OF A DIALYSIS CATHETER;  Surgeon: Waynetta Sandy, MD;  Location: [place];  Service: Vascular;  Laterality: N/A;  FISTULA SUPERFICIALIZATION Left 12/28/2015  Procedure: SUPERFICIALIZATION LEFT RADIOCEPHALIC FISTULA;  Surgeon: Serafina Mitchell, MD;  Location: [facility];  Service: Vascular;  Laterality: Left;  FISTULA SUPERFICIALIZATION Right 08/21/2017  Procedure: FISTULA SUPERFICIALIZATION RIGHT ARM;  Surgeon: Waynetta Sandy, MD;  Location: [place];  Service: Vascular;  Laterality: Right;  INSERTION OF DIALYSIS CATHETER Right 04/28/2017  Procedure: INSERTION OF DIALYSIS CATHETER-RIGHT INTERNAL JUGULAR PLACEMENT;  Surgeon: Elam Dutch, MD;  Location: [place];  Service: Vascular;  Laterality: Right;  LEFT HEART CATH AND CORONARY ANGIOGRAPHY N/A 01/13/2022  Procedure: LEFT HEART CATH AND CORONARY ANGIOGRAPHY;  Surgeon: Troy Sine, MD;  Location: [facility];  Service:  Cardiovascular;  Laterality: N/A;  LIGATION OF ARTERIOVENOUS  FISTULA Left 04/28/2017  Procedure: LIGATION OF ARTERIOVENOUS  FISTULA;  Surgeon: Elam Dutch, MD;  Location: [facility];  Service: Vascular;  Laterality: Left;  MITRAL VALVE REPAIR Right 07/08/2015  Procedure: MINIMALLY INVASIVE MITRAL VALVE REPAIR with a 26 Sorin Memo 3D Rechord;  Surgeon: Rexene Alberts, MD;  Location: [facility];  Service: Open Heart Surgery;  Laterality: Right;  MULTIPLE EXTRACTIONS WITH ALVEOLOPLASTY N/A 06/30/2015  Procedure: MULTIPLE EXTRACTIONS OF TOOTH #'S 4  AND 30 WITH ALVEOLOPLASTY AND GROSS DEBRIDEMENT  OF REMAINING TEETH;  Surgeon: Lenn Cal, DDS;  Location: [place];  Service: Oral Surgery;  Laterality: N/A;  PERIPHERAL VASCULAR CATHETERIZATION Left 03/28/2016  Procedure: A/V Fistulagram;  Surgeon: Serafina Mitchell, MD;  Location: [facility];  Service: Cardiovascular;  Laterality: Left;  lower arm  REPAIR OF PATENT FORAMEN OVALE N/A 01/11/2022  Procedure: REPAIR OF PATENT FORAMEN OVALE;  Surgeon: [name], MD;  Location: [place];  Service: Open Heart Surgery;  Laterality: N/A;  TEE WITHOUT CARDIOVERSION N/A 06/08/2015  Procedure: TRANSESOPHAGEAL ECHOCARDIOGRAM (TEE);  Surgeon: Lelon Perla, MD;  Location: [facility];  Service: Cardiovascular;  Laterality: N/A;  TEE WITHOUT CARDIOVERSION N/A 07/08/2015  Procedure: TRANSESOPHAGEAL ECHOCARDIOGRAM (TEE);  Surgeon: Rexene Alberts, MD;  Location: [place];  Service: Open Heart Surgery;  Laterality: N/A;  TEE WITHOUT CARDIOVERSION N/A 01/17/2022  Procedure: TRANSESOPHAGEAL ECHOCARDIOGRAM (TEE);  Surgeon: [name], MD;  Location: [place];  Service: Open Heart Surgery;  Laterality: N/A;  TRICUSPID VALVE REPLACEMENT N/A 01/21/2022  Procedure: TRICUSPID VALVE REPAIR USING AN 36MM EDWARDS MC3 TRICUSPID ANNULOPLASTY RING;  Surgeon: [name], MD;  Location: [place];  Service: Open Heart Surgery;  Laterality: N/A; HPI: 66 yo F presented to [facility] ED 2/4 with  chest pain. ECHO revealed severe RV failure, felt to be  consistent with chronic process such as severe pulmonary HTN. 2/7 cardiac cath - three vessel dz;  2/10 CABGx3, TVR; 2/13 chest tube removed; ETT 2/10-2/16; CVVHD.  PMH ESRD on HD, non-obstructive CAD, suspected pulmonary hypertension, Hx PE on eliquis. Passed [place] on 2/16; RN had concerns for potential dysphagia and requested SLP swallow consult.  Subjective: alert and cooperative  Recommendations for follow up therapy are one component of a multi-disciplinary discharge planning process, led by the attending physician.  Recommendations may be updated based on patient status, additional functional criteria and insurance authorization. Assessment / Plan / Recommendation Clinical Impressions 01/23/2022 Clinical Impression Pt presents with a mild oropharyngeal dysphagia. She has some lingual rocking and delayed oral transit. Mastication is a little effortful but she clears her oral cavity of solid foods well. She was not able to clear a barium tablet from her oral cavity with purees or nectar thick liquids, ultimately chewing it to get rid of it. However, this is a larger tablet that may be more challenging to swallow than some of her pills, including one in particular that per MD/RN report cannot be crushed. Pharyngeally she has mildly impaired timing that results in trace penetration to the vocal folds (PAS 5) with thin liquids. She was not observed to aspirate, but she also could not clear penetrates despite cues to cough/clear her throat. Attempted to use chin tuck but she physically and/or cognitively could not maintain this position. Also considering her mentation and that her positioning will not be as upright when she is on CRRT, would start with Dys 3 diet and nectar thick liquids. If offering pills that can't be crushed, would offer them whole in puree but would monitor for oral clearance. SLP Visit Diagnosis Dysphagia, pharyngeal phase  (R13.13) Attention and concentration deficit following -- Frontal lobe and executive function deficit following -- Impact on safety and function Mild aspiration risk   Treatment Recommendations 01/23/2022 Treatment Recommendations Therapy as outlined in treatment plan below   Prognosis 01/23/2022 Prognosis for Safe Diet Advancement Good Barriers to Reach Goals Cognitive deficits Barriers/Prognosis Comment -- Diet Recommendations 01/23/2022 SLP Diet Recommendations Dysphagia 3 (Mech soft) solids;Nectar thick liquid Liquid Administration via Cup;Straw Medication Administration Whole meds with puree Compensations Slow rate;Small sips/bites;Minimize environmental distractions Postural Changes Other (Comment)   Other Recommendations 01/23/2022 Recommended Consults -- Oral Care Recommendations Oral care BID Other Recommendations Prohibited food (jello, ice cream, thin soups);Remove water pitcher Follow Up Recommendations Acute inpatient rehab (3hours/day) Assistance recommended at discharge Frequent or constant Supervision/Assistance Functional Status Assessment Patient has had a recent decline in their functional status and demonstrates the ability to make significant improvements in function in a reasonable and predictable amount of time.  Frequency and Duration  01/23/2022 Speech Therapy Frequency (ACUTE ONLY) min 2x/week Treatment Duration 2 weeks   Oral Phase 01/23/2022 Oral Phase Impaired Oral - Pudding Teaspoon -- Oral - Pudding Cup -- Oral - Honey Teaspoon -- Oral - Honey Cup -- Oral - Nectar Teaspoon -- Oral - Nectar Cup -- Oral - Nectar Straw Other (Comment) Oral - Thin Teaspoon WFL Oral - Thin Cup -- Oral - Thin Straw Premature spillage;Other (Comment) Oral - Puree Delayed oral transit Oral - Mech Soft -- Oral - Regular Impaired mastication;Delayed oral transit Oral - Multi-Consistency -- Oral - Pill Reduced posterior propulsion;Lingual/palatal residue Oral Phase - Comment --  Pharyngeal Phase 01/23/2022 Pharyngeal  Phase Impaired Pharyngeal- Pudding Teaspoon -- Pharyngeal -- Pharyngeal- Pudding Cup -- Pharyngeal -- Pharyngeal- Honey Teaspoon -- Pharyngeal -- Pharyngeal- Honey Cup -- Pharyngeal -- Pharyngeal- Nectar Teaspoon -- Pharyngeal -- Pharyngeal- Nectar Cup -- Pharyngeal -- Pharyngeal- Nectar Straw Penetration/Aspiration before swallow Pharyngeal Material enters airway, remains ABOVE vocal cords then ejected out Pharyngeal- Thin Teaspoon WFL Pharyngeal -- Pharyngeal- Thin Cup -- Pharyngeal -- Pharyngeal- Thin Straw Penetration/Aspiration before swallow Pharyngeal Material enters airway, CONTACTS cords and not ejected out Pharyngeal- Puree WFL Pharyngeal -- Pharyngeal- Mechanical Soft -- Pharyngeal -- Pharyngeal- Regular WFL Pharyngeal -- Pharyngeal- Multi-consistency -- Pharyngeal -- Pharyngeal- Pill -- Pharyngeal -- Pharyngeal Comment --  Cervical Esophageal Phase  01/23/2022 Cervical Esophageal Phase WFL Pudding Teaspoon -- Pudding Cup -- Honey Teaspoon -- Honey Cup -- Nectar Teaspoon -- Nectar Cup -- Nectar Straw -- Thin Teaspoon -- Thin Cup -- Thin Straw -- Puree -- Mechanical Soft -- Regular -- Multi-consistency -- Pill -- Cervical Esophageal Comment -- [name]., M.A. CCC-SLP Acute Rehabilitation Services Pager [phone] Office [phone] 01/23/2022, 5:15 PM                     VAS US DUPLEX DIALYSIS ACCESS (AVF, AVG)  Result Date: 01/05/2022 DIALYSIS ACCESS Patient Name:  RAYNE COWDREY  Date of Exam:   01/05/2022 Medical Rec #: [id number]         Accession #:    [id number] Date of Birth: [birth year]          Patient Gender: F Patient Age:   41 years Exam Location:  [facility] Procedure:      VAS US DUPLEX DIALYSIS ACCESS (AVF, AVG) Referring Phys: Jen Mow --------------------------------------------------------------------------------  Reason for Exam: Infiltration at HD on tues. Swelling in RUE especially around                  AVF. Access Type: Radial-cephalic AVF. Limitations: Tissue  properties Comparison Study: No prior study Performing Technologist: Maudry Mayhew MHA, RDMS, RVT, RDCS  Examination Guidelines: A complete evaluation includes B-mode imaging, spectral Doppler, color Doppler, and power Doppler as needed of all accessible portions of each vessel. Unilateral testing is considered an integral part of a complete examination. Limited examinations for reoccurring indications may be performed as noted.  Findings: +--------------------+----------+-----------------+--------+  AVF                  PSV (cm/s) Flow Vol (mL/min) Comments  +--------------------+----------+-----------------+--------+  Native artery inflow    110            302                  +--------------------+----------+-----------------+--------+  AVF Anastomosis         311                                 +--------------------+----------+-----------------+--------+  +------------+---------+------------+----------+-------------------------------+  OUTFLOW VEIN    PSV      Diameter   Depth (cm)            Describe                             (cm/s)       (cm)                                                 +------------+---------+------------+----------+-------------------------------+  Prox Forearm    42         2.46        0.18        change in Diameter and                                                                 aneurysmal             +------------+---------+------------+----------+-------------------------------+  Mid Forearm     93         0.63        0.46                                     +------------+---------+------------+----------+-------------------------------+  Dist Forearm    72         0.59        0.36                                     +------------+---------+------------+----------+-------------------------------+   Summary: Arteriovenous fistula-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 No to-fro flow identified to suggest pseudoaneurysm. *See table(s) above for measurements and  observations.  Diagnosing physician: Monica Martinez MD Electronically signed by Monica Martinez MD on 01/05/2022 at 6:44:29 PM.   --------------------------------------------------------------------------------   Final    ECHOCARDIOGRAM COMPLETE  Result Date: 01/15/2022    ECHOCARDIOGRAM REPORT   Patient Name:   LACHANDRA DETTMANN Date of Exam: 01/15/2022 Medical Rec #:  [id number]        Height:       59.5 in Accession #:    [id number]       Weight:       153.2 lb Date of Birth:  [birth year]         BSA:          1.656 m Patient Age:    34 years         BP:           132/76 mmHg Patient Gender: F                HR:           130 bpm. Exam Location:  Inpatient Procedure: 2D Echo Indications:    CHF-Acute Systolic  History:        Patient has prior history of Echocardiogram examinations, most                 recent 01/07/2022. CHF, Prior  CABG, Pulmonary HTN; Risk                 Factors:Diabetes and Hypertension. 26 mm annuloplasty ring in                 mitral valve position. 36 mm Edwards MC3 annuloplasty ring in                 tricuspid valve position. ESRD. Hx CVA.  Sonographer:    Clayton Lefort RDCS (AE) Referring Phys: [id number] Hortencia Conradi MEIER IMPRESSIONS  1. Left ventricular ejection fraction, by estimation, is 50 to 55%. The left ventricle has low normal function. The left ventricle has no regional wall motion abnormalities. There is moderate left ventricular hypertrophy. Left ventricular diastolic parameters are indeterminate.  2. Ventricular septum is flattened in systole and diastole suggesting RV pressure and volume overload. . Right ventricular systolic function is severely reduced. The right ventricular size is severely enlarged. There is moderately elevated pulmonary artery systolic pressure.  3. There is a 26 mm prosthetic annuloplasty ring present in the mitral position. Moderate gradient across the repaired valve, mean gradient 8 mmHg. . The mitral valve has been repaired/replaced. No evidence of  mitral valve regurgitation.  4. Tricuspid valve annuloplasty 36 mm Edwards MC3 ring is in position at the TV anulus     . The tricuspid valve is has been repaired/replaced. Tricuspid valve regurgitation is severe.  5. The aortic valve has an indeterminant number of cusps. There is mild calcification of the aortic valve. There is mild thickening of the aortic valve. Aortic valve regurgitation is not visualized. No aortic stenosis is present.  6. The inferior vena cava is dilated in size with <50% respiratory variability, suggesting right atrial pressure of 15 mmHg. FINDINGS  Left Ventricle: Left ventricular ejection fraction, by estimation, is 50 to 55%. The left ventricle has low normal function. The left ventricle has no regional wall motion abnormalities. The left ventricular internal cavity size was normal in size. There is moderate left ventricular hypertrophy. Left ventricular diastolic parameters are indeterminate. Right Ventricle: Ventricular septum is flattened in systole and diastole suggesting RV pressure and volume overload. The right ventricular size is severely enlarged. Right vetricular wall thickness was not well visualized. Right ventricular systolic function is severely reduced. There is moderately elevated pulmonary artery systolic pressure. The tricuspid regurgitant velocity is 3.22 m/s, and with an assumed right atrial pressure of 15 mmHg, the estimated right ventricular systolic pressure is 65.9  mmHg. Left Atrium: Left atrial size was not well visualized. Right Atrium: Right atrial size was not well visualized. Pericardium: There is no evidence of pericardial effusion. Mitral Valve: There is a 26 mm prosthetic annuloplasty ring present in the mitral position. Moderate gradient across the repaired valve, mean gradient 8 mmHg. The mitral valve has been repaired/replaced. No evidence of mitral valve regurgitation. MV peak  gradient, 16.5 mmHg. The mean mitral valve gradient is 9.0 mmHg. Tricuspid  Valve: Tricuspid valve annuloplasty 36 mm Edwards MC3 ring is in position at the TV anulus. The tricuspid valve is has been repaired/replaced. Tricuspid valve regurgitation is severe. No evidence of tricuspid stenosis. Aortic Valve: The aortic valve has an indeterminant number of cusps. There is mild calcification of the aortic valve. There is mild thickening of the aortic valve. There is mild aortic valve annular calcification. Aortic valve regurgitation is not visualized. No aortic stenosis is present. Aortic valve mean gradient measures 4.0 mmHg. Aortic valve peak gradient measures 7.7 mmHg. Aortic  valve area, by VTI measures 1.68 cm. Pulmonic Valve: The pulmonic valve was not well visualized. Pulmonic valve regurgitation is mild. No evidence of pulmonic stenosis. Aorta: The aortic root is normal in size and structure. Venous: The inferior vena cava is dilated in size with less than 50% respiratory variability, suggesting right atrial pressure of 15 mmHg. IAS/Shunts: The interatrial septum is aneurysmal. The interatrial septum was not well visualized.  LEFT VENTRICLE PLAX 2D LVIDd:         3.70 cm LVIDs:         2.80 cm LV PW:         1.50 cm LV IVS:        1.50 cm LVOT diam:     1.80 cm LV SV:         30 LV SV Index:   18 LVOT Area:     2.54 cm  RIGHT VENTRICLE            IVC RV Basal diam:  3.40 cm    IVC diam: 2.20 cm RV S prime:     6.85 cm/s TAPSE (M-mode): 0.7 cm LEFT ATRIUM             Index        RIGHT ATRIUM           Index LA diam:        3.70 cm 2.23 cm/m   RA Area:     18.80 cm LA Vol (A2C):   67.2 ml 40.57 ml/m  RA Volume:   49.00 ml  29.58 ml/m LA Vol (A4C):   52.2 ml 31.51 ml/m LA Biplane Vol: 65.4 ml 39.48 ml/m  AORTIC VALVE AV Area (Vmax):    1.89 cm AV Area (Vmean):   1.66 cm AV Area (VTI):     1.68 cm AV Vmax:           139.00 cm/s AV Vmean:          90.600 cm/s AV VTI:            0.177 m AV Peak Grad:      7.7 mmHg AV Mean Grad:      4.0 mmHg LVOT Vmax:         103.00 cm/s LVOT  Vmean:        59.200 cm/s LVOT VTI:          0.117 m LVOT/AV VTI ratio: 0.66  AORTA Ao Root diam: 3.00 cm Ao Asc diam:  3.20 cm MITRAL VALVE              TRICUSPID VALVE MV Area VTI:  0.78 cm    TR Peak grad:   41.5 mmHg MV Peak grad: 16.5 mmHg   TR Vmax:        322.00 cm/s MV Mean grad: 9.0 mmHg MV Vmax:      2.03 m/s    SHUNTS MV Vmean:     140.0 cm/s  Systemic VTI:  0.12 m                           Systemic Diam: 1.80 cm Carlyle Dolly MD Electronically signed by Carlyle Dolly MD Signature Date/Time: 01/15/2022/12:30:18 PM    Final    ECHOCARDIOGRAM COMPLETE  Result Date: 01/02/2022    ECHOCARDIOGRAM REPORT   Patient Name:   MARISSA WEAVER Date of Exam: 01/02/2022 Medical Rec #:  [id number]  Height:       59.5 in Accession #:    [id number]       Weight:       169.8 lb Date of Birth:  1955-12-05         BSA:          1.730 m Patient Age:    58 years         BP:           103/74 mmHg Patient Gender: F                HR:           91 bpm. Exam Location:  Inpatient Procedure: 2D Echo, Cardiac Doppler and Color Doppler Indications:    Chest pain  History:        Patient has prior history of Echocardiogram examinations. CHF,                 Pulmonary HTN; Risk Factors:Hypertension and Diabetes.                  Mitral Valve: 26 mm prosthetic annuloplasty ring valve is                 present in the mitral position.  Sonographer:    Jyl Heinz Referring Phys: [id number] [place]  1. Left ventricular ejection fraction, by estimation, is 40 to 45%. The left ventricle has mildly decreased function. The left ventricle has no regional wall motion abnormalities. There is moderate left ventricular hypertrophy. Left ventricular diastolic parameters are indeterminate.  2. Right ventricular systolic function is mildly reduced. The right ventricular size is mildly enlarged. There is mildly elevated pulmonary artery systolic pressure. The estimated right ventricular systolic pressure is 86.7 mmHg.  3.  Left atrial size was mildly dilated.  4. The mitral valve has been repaired/replaced. No evidence of mitral valve regurgitation. Moderate mitral stenosis. The mean mitral valve gradient is 8.5 mmHg. There is a 26 mm prosthetic annuloplasty ring present in the mitral position.  5. Tricuspid valve regurgitation is severe. Moderate tricuspid stenosis.  6. The aortic valve is normal in structure. Aortic valve regurgitation is not visualized. No aortic stenosis is present.  7. The inferior vena cava is dilated in size with <50% respiratory variability, suggesting right atrial pressure of 15 mmHg. Comparison(s): No significant change from prior study. Prior images reviewed side by side. FINDINGS  Left Ventricle: Left ventricular ejection fraction, by estimation, is 40 to 45%. The left ventricle has mildly decreased function. The left ventricle has no regional wall motion abnormalities. The left ventricular internal cavity size was normal in size. There is moderate left ventricular hypertrophy. Left ventricular diastolic parameters are indeterminate. Right Ventricle: The right ventricular size is mildly enlarged. No increase in right ventricular wall thickness. Right ventricular systolic function is mildly reduced. There is mildly elevated pulmonary artery systolic pressure. The tricuspid regurgitant  velocity is 2.48 m/s, and with an assumed right atrial pressure of 15 mmHg, the estimated right ventricular systolic pressure is 67.2 mmHg. Left Atrium: Left atrial size was mildly dilated. Right Atrium: Right atrial size was normal in size. Pericardium: There is no evidence of pericardial effusion. Mitral Valve: The mitral valve has been repaired/replaced. There is severe thickening of the mitral valve leaflet(s). There is severe calcification of the mitral valve leaflet(s). Mildly decreased mobility of the mitral valve leaflets. No evidence of mitral valve regurgitation. There is a 26 mm prosthetic annuloplasty ring present  in the mitral position. Moderate mitral valve stenosis. MV peak gradient, 14.7 mmHg. The mean mitral valve gradient is 8.5 mmHg. Tricuspid Valve: The tricuspid valve is normal in structure. Tricuspid valve regurgitation is severe. Moderate tricuspid stenosis. Aortic Valve: The aortic valve is normal in structure. Aortic valve regurgitation is not visualized. Aortic regurgitation PHT measures 359 msec. No aortic stenosis is present. Aortic valve peak gradient measures 5.8 mmHg. Pulmonic Valve: The pulmonic valve was normal in structure. Pulmonic valve regurgitation is trivial. No evidence of pulmonic stenosis. Aorta: The aortic root is normal in size and structure. Venous: The inferior vena cava is dilated in size with less than 50% respiratory variability, suggesting right atrial pressure of 15 mmHg. IAS/Shunts: There is redundancy of the interatrial septum. No atrial level shunt detected by color flow Doppler.  LEFT VENTRICLE PLAX 2D LVIDd:         5.10 cm      Diastology LVIDs:         4.40 cm      LV e' medial:    6.42 cm/s LV PW:         1.70 cm      LV E/e' medial:  24.1 LV IVS:        1.20 cm      LV e' lateral:   7.40 cm/s LVOT diam:     2.00 cm      LV E/e' lateral: 20.9 LV SV:         47 LV SV Index:   27 LVOT Area:     3.14 cm  LV Volumes (MOD) LV vol d, MOD A2C: 147.0 ml LV vol d, MOD A4C: 95.3 ml LV vol s, MOD A2C: 75.7 ml LV vol s, MOD A4C: 48.8 ml LV SV MOD A2C:     71.3 ml LV SV MOD A4C:     95.3 ml LV SV MOD BP:      59.2 ml RIGHT VENTRICLE            IVC RV Basal diam:  4.50 cm    IVC diam: 2.70 cm RV Mid diam:    4.00 cm RV S prime:     5.98 cm/s TAPSE (M-mode): 0.9 cm LEFT ATRIUM           Index        RIGHT ATRIUM           Index LA diam:      4.80 cm 2.77 cm/m   RA Area:     28.90 cm LA Vol (A2C): 61.3 ml 35.43 ml/m  RA Volume:   109.00 ml 63.00 ml/m LA Vol (A4C): 64.3 ml 37.16 ml/m  AORTIC VALVE AV Area (Vmax): 2.22 cm AV Vmax:        120.00 cm/s AV Peak Grad:   5.8 mmHg LVOT Vmax:       84.70 cm/s LVOT Vmean:     58.200 cm/s LVOT VTI:       0.149 m AI PHT:         359 msec  AORTA Ao Root diam: 2.60 cm Ao Asc diam:  3.30 cm MITRAL VALVE                TRICUSPID VALVE MV Area (PHT): 3.01 cm     TR Peak grad:   24.6 mmHg MV Area VTI:   1.08 cm     TR Vmax:        248.00 cm/s MV Peak grad:  14.7 mmHg MV Mean grad:  8.5 mmHg     SHUNTS MV Vmax:       1.92 m/s     Systemic VTI:  0.15 m MV Vmean:      140.0 cm/s   Systemic Diam: 2.00 cm MV Decel Time: 252 msec MV E velocity: 155.00 cm/s MV A velocity: 179.00 cm/s MV E/A ratio:  0.87 Candee Furbish MD Electronically signed by Candee Furbish MD Signature Date/Time: 01/02/2022/2:17:58 PM    Final    ECHO INTRAOPERATIVE TEE  Result Date: 01/09/2022  *INTRAOPERATIVE TRANSESOPHAGEAL REPORT *  Patient Name:   PADEN KURAS Date of Exam: 01/24/2022 Medical Rec #:  [id number]        Height:       59.5 in Accession #:    [id number]       Weight:       164.0 lb Date of Birth:  04-02-1956         BSA:          1.71 m Patient Age:    68 years         BP:           102/65 mmHg Patient Gender: F                HR:           70 bpm. Exam Location:  Inpatient Transesophogeal exam was perform intraoperatively during surgical procedure. Patient was closely monitored under general anesthesia during the entirety of examination. Indications:     CABG                  Possible tricuspid valve replacement Performing Phys: [place] C HENDRICKSON Diagnosing Phys: Hoy Morn MD Complications: No known complications during this procedure. POST-OP IMPRESSIONS _ Left Ventricle: The cavity size was mildly dilated. The wall motion is abnormal with regional variation. _ Right Ventricle: moderately reduced function. The cavity was moderately dilated. The wall motion is abnormal. _ Aorta: The aorta appears unchanged from pre-bypass. _ Left Atrial Appendage: The left atrial appendage appears unchanged from pre-bypass. _ Aortic Valve: The aortic valve appears unchanged from pre-bypass. _  Mitral Valve: There is trivial regurgitation. _ Tricuspid Valve: There is severe regurgitation. Severe regurgitation post repair. _ Pulmonic Valve: The pulmonic valve appears unchanged from pre-bypass. _ Interatrial Septum: The interatrial septum appears unchanged from pre-bypass. _ Pericardium: The pericardium appears unchanged from pre-bypass. _ Comments: Post-bypass images reviewed with surgeon. Patient on epinephrine, levophed, vasopressin, and milrinone infusions post-bypass. PRE-OP FINDINGS  Left Ventricle: The left ventricle has mild-moderately reduced systolic function, with an ejection fraction of 40-45%. The cavity size was mildly dilated. Moderate hypokinesis of the left ventricular, mid inferior wall. There is moderate concentric left  ventricular hypertrophy.  LV Wall Scoring: The mid inferior segment is hypokinetic.  Right Ventricle: The right ventricle has mildly reduced systolic function. The cavity was dialated. There is no increase in right ventricular wall thickness. Left Atrium: Left atrial size was dilated. No left atrial/left atrial appendage thrombus was detected. Left atrial appendage velocity is reduced at less than 40 cm/s. Right Atrium: Right atrial size was dilated. Interatrial Septum: Evidence of atrial level shunting detected by color flow Doppler. A small patent foramen ovale is detected with predominantly left to right shunting across the atrial septum. Pericardium: There is no evidence of pericardial effusion. Mitral Valve: The mitral valve has been repaired/replaced. Mitral valve regurgitation is not visualized by color flow Doppler. There is  Mild mitral stenosis. Tricuspid Valve: The tricuspid valve was normal in structure. Tricuspid valve regurgitation is severe by color flow Doppler. Moderate tricuspid stenosis is present. The tricuspid valve is status post repair with an annuloplasty ring. Aortic Valve: The aortic valve is tricuspid Aortic valve regurgitation was not visualized  by color flow Doppler. Pulmonic Valve: The pulmonic valve was normal in structure. Pulmonic valve regurgitation is not visualized by color flow Doppler. Aorta: The aortic root, ascending aorta and aortic arch are normal in size and structure. Pulmonary Artery: The pulmonary artery is moderately dilated. +--------------+-------++  LEFT VENTRICLE           +--------------+-------++  PLAX 2D                  +--------------+-------++  LVIDd:         5.90 cm   +--------------+-------++  LVIDs:         4.40 cm   +--------------+-------++  LV SV:         86 ml     +--------------+-------++  LV SV Index:   47.50     +--------------+-------++                           +--------------+-------++ +-------------+---------++ +---------------+-----------++  MITRAL VALVE               TRICUSPID VALVE               +-------------+---------++ +---------------+-----------++  MV Peak grad: 7.2 mmHg     TV Peak grad:   3.6 mmHg      +-------------+---------++ +---------------+-----------++  MV Mean grad: 3.0 mmHg     TV Mean grad:   2.0 mmHg      +-------------+---------++ +---------------+-----------++  MV Vmax:      1.34 m/s     TV Vmax:        0.94 m/s      +-------------+---------++ +---------------+-----------++  MV Vmean:     76.2 cm/s    TV Vmean:       58.6 cm/s     +-------------+---------++ +---------------+-----------++  MV VTI:       0.45 m       TV VTI:         0.24 msec     +-------------+---------++ +---------------+-----------++                             TR Peak grad:   28.7 mmHg                                +---------------+-----------++                             TR Mean grad:   11.0 mmHg                                +---------------+-----------++                             TR Vmax:        268.00 cm/s                              +---------------+-----------++  TR Vmean:       147.0 cm/s                               +---------------+-----------++  Hoy Morn MD Electronically  signed by Hoy Morn MD Signature Date/Time: 01/09/2022/9:48:50 AM    Final    VAS US DOPPLER PRE CABG  Result Date: 01/05/2022 PREOPERATIVE VASCULAR EVALUATION Patient Name:  EVERLENA MACKLEY  Date of Exam:   01/05/2022 Medical Rec #: [id number]         Accession #:    [id number] Date of Birth: [birth year]          Patient Gender: F Patient Age:   89 years Exam Location:  [facility] Procedure:      VAS US DOPPLER PRE CABG Referring Phys: Remo Lipps HENDRICKSON --------------------------------------------------------------------------------  Indications:      Pre-CABG. Risk Factors:     Hypertension, hyperlipidemia, Diabetes, prior CVA. Limitations:      patient movement Comparison Study: no prior Performing Technologist: Archie Patten RVS  Examination Guidelines: A complete evaluation includes B-mode imaging, spectral Doppler, color Doppler, and power Doppler as needed of all accessible portions of each vessel. Bilateral testing is considered an integral part of a complete examination. Limited examinations for reoccurring indications may be performed as noted.  Right Carotid Findings: +----------+--------+--------+--------+------------+--------+             PSV cm/s EDV cm/s Stenosis Describe     Comments  +----------+--------+--------+--------+------------+--------+  CCA Prox   53       11                heterogenous           +----------+--------+--------+--------+------------+--------+  CCA Distal 60       14                heterogenous           +----------+--------+--------+--------+------------+--------+  ICA Prox   50       18       1-39%    heterogenous           +----------+--------+--------+--------+------------+--------+  ICA Distal 45       15                                       +----------+--------+--------+--------+------------+--------+  ECA        46                                                +----------+--------+--------+--------+------------+--------+  +----------+--------+-------+--------+------------+             PSV cm/s EDV cms Describe Arm Pressure  +----------+--------+-------+--------+------------+  Subclavian 101      22                             +----------+--------+-------+--------+------------+ +---------+--------+--+--------+-+---------+  Vertebral PSV cm/s 30 EDV cm/s 8 Antegrade  +---------+--------+--+--------+-+---------+ Left Carotid Findings: +----------+--------+--------+--------+------------+--------+             PSV cm/s EDV cm/s Stenosis Describe     Comments  +----------+--------+--------+--------+------------+--------+  CCA Prox   103      19  heterogenous           +----------+--------+--------+--------+------------+--------+  CCA Distal 67       18                heterogenous           +----------+--------+--------+--------+------------+--------+  ICA Prox   93       30       1-39%    heterogenous           +----------+--------+--------+--------+------------+--------+  ICA Distal 60       22                                       +----------+--------+--------+--------+------------+--------+  ECA        34                                                +----------+--------+--------+--------+------------+--------+ +----------+--------+--------+--------+------------+  Subclavian PSV cm/s EDV cm/s Describe Arm Pressure  +----------+--------+--------+--------+------------+             45                                       +----------+--------+--------+--------+------------+ +---------+--------+--+--------+-+---------+  Vertebral PSV cm/s 34 EDV cm/s 9 Antegrade  +---------+--------+--+--------+-+---------+  ABI Findings: +---------+------------------+-----+---------+---------------------------------+  Right     Rt Pressure (mmHg) Index Waveform  Comment                            +---------+------------------+-----+---------+---------------------------------+  Brachial                           triphasic fistula                             +---------+------------------+-----+---------+---------------------------------+  PTA       113                1.20  biphasic                                     +---------+------------------+-----+---------+---------------------------------+  DP        106                1.13  triphasic                                    +---------+------------------+-----+---------+---------------------------------+  Great Toe                                    unable to obtain waveform or                                                     pressure due to paient  constant                                                  movement                           +---------+------------------+-----+---------+---------------------------------+ +---------+------------------+-----+---------+---------------------------------+  Left      Lt Pressure (mmHg) Index Waveform  Comment                            +---------+------------------+-----+---------+---------------------------------+  Brachial  94                       triphasic                                    +---------+------------------+-----+---------+---------------------------------+  PTA                                          unable to obtain waveform or                                                     pressure due to paient constant                                                  movement                           +---------+------------------+-----+---------+---------------------------------+  DP        121                1.29  triphasic                                    +---------+------------------+-----+---------+---------------------------------+  Great Toe                                    unable to obtain waveform or                                                     pressure due to paient constant                                                  movement                            +---------+------------------+-----+---------+---------------------------------+ +-------+---------------+----------------+  ABI/TBI Today's ABI/TBI Previous ABI/TBI  +-------+---------------+----------------+  Right   1.20                              +-------+---------------+----------------+  Left    1.29                              +-------+---------------+----------------+  Right Doppler Findings: +--------+--------+-----+---------+--------+  Site     Pressure Index Doppler   Comments  +--------+--------+-----+---------+--------+  Brachial                triphasic fistula   +--------+--------+-----+---------+--------+  Radial                            fistula   +--------+--------+-----+---------+--------+  Ulnar                             fistula   +--------+--------+-----+---------+--------+  Left Doppler Findings: +--------+--------+-----+---------+--------+  Site     Pressure Index Doppler   Comments  +--------+--------+-----+---------+--------+  Brachial 94             triphasic           +--------+--------+-----+---------+--------+  Radial                  triphasic           +--------+--------+-----+---------+--------+  Ulnar                   triphasic           +--------+--------+-----+---------+--------+  Summary: Right Carotid: Velocities in the right ICA are consistent with a 1-39% stenosis. Left Carotid: Velocities in the left ICA are consistent with a 1-39% stenosis. Vertebrals: Bilateral vertebral arteries demonstrate antegrade flow. Right ABI: Resting right ankle-brachial index is within normal range. No evidence of significant right lower extremity arterial disease. Left ABI: Resting left ankle-brachial index is within normal range. No evidence of significant left lower extremity arterial disease. Right Upper Extremity: Unable to obtain allens test due to fistula. Left Upper Extremity: Doppler waveforms remain within normal limits with left radial compression. Doppler waveforms remain within  normal limits with left ulnar compression.  Electronically signed by Monica Martinez MD on 01/05/2022 at 6:34:02 PM.    Final      Discharge Medications: Allergies as of 01/27/2022       Reactions   Minoxidil Other (See Comments)   Pericardial effusion   Lipitor [atorvastatin] Other (See Comments)   MYALGIAS > "pain in legs" Tolerates rosuvastatin   Morphine And Related Itching   Ace Inhibitors Other (See Comments)   REACTION: "not sure...think it made me drowsy all the time"     Med Rec must be completed prior to using this [facility]***       Follow Up Appointments:  Follow-up Information     [name], MD. Go on 02/14/2022.   Specialty: Cardiothoracic Surgery Why: PA/LAT CXR to be taken (at [facility] which is in the same building as Dr. Leonarda Salon office) on 03/21 at 3:15 pm;Appointment time is at 3:45 pm Contact information: [place] [address] [phone]         Lelon Perla, MD. Go on 03/01/2022.   Specialty: Cardiology Why: Appointment time is at 4:00 pm Contact information: [address] [place] [address] [phone]                 Signed: Sharalyn Ink [facility] 01/27/2022, 7:44 AM

## 2022-01-09 NOTE — Progress Notes (Signed)
[facility] Progress Note  Name: Jody Nguyen MRN: [id number] DOB: [birth year]   Subjective:  Seen in ICU and on CRRT. Able to pull fluid overnight with CRRT w/ stable pressor requirements. Discussed w/ RN  Intake/Output Summary (Last 24 hours) at 01/09/2022 0923 Last data filed at 01/09/2022 0900 Gross per 24 hour  Intake 3264.04 ml  Output 4549 ml  Net -1284.96 ml    Vitals:  Vitals:   01/09/22 0500 01/09/22 0600 01/09/22 0700 01/09/22 0732  BP:      Pulse: (!) 114 (!) 111    Resp: 13 17 (!) 8   Temp: (!) 95.4 F (35.2 C) (!) 94.5 F (34.7 C) (!) 94.1 F (34.5 C)   TempSrc:      SpO2: 100% 100%  100%  Weight:      Height:         Physical Exam:  General adult female in bed intubated critically ill  HEENT normocephalic atraumatic; lips / face swollen  Neck supple trachea midline Lungs coarse mechanical breaths sounds; FIO2 40 and PEEP 5 Heart S1S2 tachycardic at 122  Abdomen soft obese habitus Extremities 1-2+ diffuse edema; right leg is wrapped with drain  Neuro - sedated Access: right arm AVF in place, no bruit or thrill  Medications reviewed   Labs:  BMP Latest Ref Rng & Units 01/09/2022 01/08/2022 01/08/2022  Glucose 70 - 99 mg/dL 255(H) 204(H) 263(H)  BUN 8 - 23 mg/dL 16 16 17   Creatinine 0.44 - 1.00 mg/dL 2.15(H) 2.66(H) 3.32(H)  BUN/Creat Ratio 6 - 22 (calc) - - -  Sodium 135 - 145 mmol/L 134(L) 136 135  Potassium 3.5 - 5.1 mmol/L 4.2 4.4 4.5  Chloride 98 - 111 mmol/L 102 103 102  CO2 22 - 32 mmol/L 22 21(L) 20(L)  Calcium 8.9 - 10.3 mg/dL 8.7(L) 8.2(L) 8.2(L)   Dialysis Orders: MWF at [facility] 3:15hr, 450/A1.5, EDW 75.5kg, 2K/2Ca, UFP #2, AVF, heparin 2500 unit bolus - Hectoral 77mcg IV q HD - Sensipar 90mg  PO q HD - Mircera 129mcg IV q 2 weeks (ordered, not yet given. Last Mircera 100 on 12/07/21)   Assessment/Plan:   CAD s/p CABG x3, TR sp TVR:  per CT surgery   ESRD:  normally MWF schedule, last IHD Thursday 2/09. Remains on  pressors x 3. CRRT started 2/11, remains critically ill. Cont CRRT.  Infiltrated AVF - AVF infiltrated on Tuesday. Expert cannulator able to stick 2/09 and completed full treatment. Postop no bruit today again, suspect has clotted. Postpone this issue to when more stable.  Hypotension - 3 pressors and milrinone. Pressor support per primary service Volume: hx of HTN now in shock post-op. Vol up on exam, will aim for net neg 50-100cc/hr w/ CRRT  Anemia CKD : s/p PRBC's , Hb down to 9.1 today Will dc darbepoeitin order (ordered but never given).   Metabolic bone disease: resume home binders when taking PO. Also sensipar. Cont IV vdra.   Pulm HTN:  Followed at [facility], on Revatio and opsumit  T2DM-per primary service  Hx of MV replacement - in 2016  Gean Quint, MD 01/09/2022 9:23 AM

## 2022-01-09 NOTE — Progress Notes (Signed)
CT surgery PM rounds  Patient has stable day with CRRT removing 75-100 cc/h Milrinone weaned to 0.125 P.m.coox 72% Comfortable on ventilator with low-dose Precedex  Blood pressure (!) 113/54, pulse (!) 111, temperature (!) 94 F (34.4 C), temperature source Axillary, resp. rate (!) 8, height 4' 11.5" (1.511 m), weight 81.2 kg, SpO2 98 %.

## 2022-01-09 NOTE — Progress Notes (Signed)
3 Days Post-Op Procedure(s) (LRB): CORONARY ARTERY BYPASS GRAFTING (CABG) TIMES THREE ON CARDIOPULMONARY BYPASS. LIMA TO LAD, SVG TO PD, SVG TO DIAG (N/A) TRICUSPID VALVE REPAIR USING AN 36MM EDWARDS MC3 TRICUSPID ANNULOPLASTY RING (N/A) TRANSESOPHAGEAL ECHOCARDIOGRAM (TEE) (N/A) APPLICATION OF CELL SAVER (N/A) ENDOVEIN HARVEST OF GREATER SAPHENOUS VEIN (Right) REPAIR OF PATENT FORAMEN OVALE (N/A) Subjective: Intubated, responds to questions, commands  Objective: Vital signs in last 24 hours: Temp:  [94.1 F (34.5 C)-98.2 F (36.8 C)] 94.1 F (34.5 C) (02/13 0700) Pulse Rate:  [110-145] 111 (02/13 0600) Cardiac Rhythm: Sinus tachycardia (02/12 2000) Resp:  [5-31] 8 (02/13 0700) BP: (95-113)/(50-54) 113/54 (02/12 1543) SpO2:  [91 %-100 %] 100 % (02/13 0732) Arterial Line BP: (81-138)/(43-70) 112/59 (02/13 0700) FiO2 (%):  [40 %-50 %] 40 % (02/13 0732)  Hemodynamic parameters for last 24 hours: CVP:  [15 mmHg-31 mmHg] 17 mmHg  Intake/Output from previous day: 02/12 0701 - 02/13 0700 In: 3527 [I.V.:2489.8; NG/GT:690; IV Piggyback:347.2] Out: 4768 [Drains:101; Chest Tube:230] Intake/Output this shift: No intake/output data recorded.  General appearance: cooperative Neurologic: nonfocal Heart: tachy, regular, 2/6 murmur Lungs: clear anteriorly Abdomen: mildly distended, + BS but hypoactive Wound: clean and dry  Lab Results: Recent Labs    01/07/22 1609 01/09/22 0509  WBC 17.3* 17.1*  HGB 10.9* 9.1*  HCT 32.1* 28.3*  PLT 123* 52*   BMET:  Recent Labs    01/08/22 1508 01/09/22 0509  NA 136 134*  K 4.4 4.2  CL 103 102  CO2 21* 22  GLUCOSE 204* 255*  BUN 16 16  CREATININE 2.66* 2.15*  CALCIUM 8.2* 8.7*    PT/INR:  Recent Labs    01/09/22 0509  LABPROT 20.1*  INR 1.7*   ABG    Component Value Date/Time   PHART 7.237 (L) 01/07/2022 0648   HCO3 19.7 (L) 01/07/2022 0648   TCO2 21 (L) 01/07/2022 0648   ACIDBASEDEF 8.0 (H) 01/07/2022 0648   O2SAT 73.2  01/09/2022 0509   CBG (last 3)  Recent Labs    01/08/22 1508 01/08/22 1951 01/09/22 0505  GLUCAP 198* 220* 219*    Assessment/Plan: S/P Procedure(s) (LRB): CORONARY ARTERY BYPASS GRAFTING (CABG) TIMES THREE ON CARDIOPULMONARY BYPASS. LIMA TO LAD, SVG TO PD, SVG TO DIAG (N/A) TRICUSPID VALVE REPAIR USING AN 36MM EDWARDS MC3 TRICUSPID ANNULOPLASTY RING (N/A) TRANSESOPHAGEAL ECHOCARDIOGRAM (TEE) (N/A) APPLICATION OF CELL SAVER (N/A) ENDOVEIN HARVEST OF GREATER SAPHENOUS VEIN (Right) REPAIR OF PATENT FORAMEN OVALE (N/A) Remains critically ill NEURO- no focal defiicit Cv- Sinus tachy, Co-ox 73, CI 2.5 by Flotrac, CVp down to 17  Still on milrinone, epi, norepi and vasopresin  Able to wean a little on the norepi and epi over past 24 hours  Decrease milrinone to 0.25, then to 0.125 later today HEME- Platelets down to 52K- dc heparin, check HIT RESP_ VDRF- IMV 26/40%/5 PEEP  Vent per CCM RENAL- on CVVHD- 1.2L negative yesterday  Per Nephrology ENDO- CBG elevated- increase levemir GI/ Nutrition- on trickle feeds via OG SCD for DVT prophylaxis   LOS: 8 days    Jody Nguyen 01/09/2022

## 2022-01-10 ENCOUNTER — Inpatient Hospital Stay (HOSPITAL_COMMUNITY): Payer: Medicare Other

## 2022-01-10 DIAGNOSIS — Z992 Dependence on renal dialysis: Secondary | ICD-10-CM | POA: Diagnosis not present

## 2022-01-10 DIAGNOSIS — N186 End stage renal disease: Secondary | ICD-10-CM | POA: Diagnosis not present

## 2022-01-10 LAB — PROTIME-INR
INR: 1.5 — ABNORMAL HIGH (ref 0.8–1.2)
Prothrombin Time: 17.8 seconds — ABNORMAL HIGH (ref 11.4–15.2)

## 2022-01-10 LAB — CBC WITH DIFFERENTIAL/PLATELET
Abs Immature Granulocytes: 0.31 10*3/uL — ABNORMAL HIGH (ref 0.00–0.07)
Basophils Absolute: 0 10*3/uL (ref 0.0–0.1)
Basophils Relative: 0 %
Eosinophils Absolute: 0 10*3/uL (ref 0.0–0.5)
Eosinophils Relative: 0 %
HCT: 28.6 % — ABNORMAL LOW (ref 36.0–46.0)
Hemoglobin: 9.1 g/dL — ABNORMAL LOW (ref 12.0–15.0)
Immature Granulocytes: 2 %
Lymphocytes Relative: 9 %
Lymphs Abs: 1.6 10*3/uL (ref 0.7–4.0)
MCH: 28.3 pg (ref 26.0–34.0)
MCHC: 31.8 g/dL (ref 30.0–36.0)
MCV: 88.8 fL (ref 80.0–100.0)
Monocytes Absolute: 1 10*3/uL (ref 0.1–1.0)
Monocytes Relative: 5 %
Neutro Abs: 15.3 10*3/uL — ABNORMAL HIGH (ref 1.7–7.7)
Neutrophils Relative %: 84 %
Platelets: 52 10*3/uL — ABNORMAL LOW (ref 150–400)
RBC: 3.22 MIL/uL — ABNORMAL LOW (ref 3.87–5.11)
RDW: 20.3 % — ABNORMAL HIGH (ref 11.5–15.5)
WBC: 18.3 10*3/uL — ABNORMAL HIGH (ref 4.0–10.5)
nRBC: 12.6 % — ABNORMAL HIGH (ref 0.0–0.2)

## 2022-01-10 LAB — GLUCOSE, CAPILLARY
Glucose-Capillary: 166 mg/dL — ABNORMAL HIGH (ref 70–99)
Glucose-Capillary: 83 mg/dL (ref 70–99)
Glucose-Capillary: 101 mg/dL — ABNORMAL HIGH (ref 70–99)
Glucose-Capillary: 95 mg/dL (ref 70–99)
Glucose-Capillary: 103 mg/dL — ABNORMAL HIGH (ref 70–99)
Glucose-Capillary: 111 mg/dL — ABNORMAL HIGH (ref 70–99)
Glucose-Capillary: 110 mg/dL — ABNORMAL HIGH (ref 70–99)

## 2022-01-10 LAB — COMPREHENSIVE METABOLIC PANEL
ALT: 6 U/L (ref 0–44)
AST: 47 U/L — ABNORMAL HIGH (ref 15–41)
Albumin: 3.2 g/dL — ABNORMAL LOW (ref 3.5–5.0)
Alkaline Phosphatase: 107 U/L (ref 38–126)
Anion gap: 12 (ref 5–15)
BUN: 12 mg/dL (ref 8–23)
CO2: 23 mmol/L (ref 22–32)
Calcium: 9.2 mg/dL (ref 8.9–10.3)
Chloride: 102 mmol/L (ref 98–111)
Creatinine, Ser: 1.82 mg/dL — ABNORMAL HIGH (ref 0.44–1.00)
GFR, Estimated: 30 mL/min — ABNORMAL LOW (ref >60)
Glucose, Bld: 99 mg/dL (ref 70–99)
Potassium: 4.2 mmol/L (ref 3.5–5.1)
Sodium: 137 mmol/L (ref 135–145)
Total Bilirubin: 1 mg/dL (ref 0.3–1.2)
Total Protein: 5.9 g/dL — ABNORMAL LOW (ref 6.5–8.1)

## 2022-01-10 LAB — RENAL FUNCTION PANEL
Albumin: 3.2 g/dL — ABNORMAL LOW (ref 3.5–5.0)
Anion gap: 10 (ref 5–15)
BUN: 11 mg/dL (ref 8–23)
CO2: 23 mmol/L (ref 22–32)
Calcium: 9 mg/dL (ref 8.9–10.3)
Chloride: 106 mmol/L (ref 98–111)
Creatinine, Ser: 1.71 mg/dL — ABNORMAL HIGH (ref 0.44–1.00)
GFR, Estimated: 33 mL/min — ABNORMAL LOW (ref >60)
Glucose, Bld: 107 mg/dL — ABNORMAL HIGH (ref 70–99)
Phosphorus: 2.4 mg/dL — ABNORMAL LOW (ref 2.5–4.6)
Potassium: 4.4 mmol/L (ref 3.5–5.1)
Sodium: 139 mmol/L (ref 135–145)

## 2022-01-10 LAB — COOXEMETRY PANEL
Carboxyhemoglobin: 1.6 % — ABNORMAL HIGH (ref 0.5–1.5)
Methemoglobin: 1.1 % (ref 0.0–1.5)
O2 Saturation: 61.6 %
Total hemoglobin: 9.7 g/dL — ABNORMAL LOW (ref 12.0–16.0)

## 2022-01-10 LAB — PATHOLOGIST SMEAR REVIEW

## 2022-01-10 LAB — MAGNESIUM: Magnesium: 2.7 mg/dL — ABNORMAL HIGH (ref 1.7–2.4)

## 2022-01-10 LAB — APTT: aPTT: 40 seconds — ABNORMAL HIGH (ref 24–36)

## 2022-01-10 LAB — HEPARIN INDUCED PLATELET AB (HIT ANTIBODY): Heparin Induced Plt Ab: 0.075 OD (ref 0.000–0.400)

## 2022-01-10 MED ORDER — SODIUM CHLORIDE 0.9 % IV SOLN
250.0000 mL | INTRAVENOUS | Status: DC
  Administered 2022-01-13: 250 mL via INTRAVENOUS

## 2022-01-10 MED FILL — Heparin Sodium (Porcine) Inj 1000 Unit/ML: Qty: 1000 | Status: AC

## 2022-01-10 MED FILL — Potassium Chloride Inj 2 mEq/ML: INTRAVENOUS | Qty: 40 | Status: AC

## 2022-01-10 MED FILL — Magnesium Sulfate Inj 50%: INTRAMUSCULAR | Qty: 10 | Status: AC

## 2022-01-10 NOTE — Progress Notes (Signed)
EVENING ROUNDS NOTE :     [address]       [place] [address]             [phone]                 4 Days Post-Op Procedure(s) (LRB): CORONARY ARTERY BYPASS GRAFTING (CABG) TIMES THREE ON CARDIOPULMONARY BYPASS. LIMA TO LAD, SVG TO PD, SVG TO DIAG (N/A) TRICUSPID VALVE REPAIR USING AN 36MM EDWARDS MC3 TRICUSPID ANNULOPLASTY RING (N/A) TRANSESOPHAGEAL ECHOCARDIOGRAM (TEE) (N/A) APPLICATION OF CELL SAVER (N/A) ENDOVEIN HARVEST OF GREATER SAPHENOUS VEIN (Right) REPAIR OF PATENT FORAMEN OVALE (N/A)   Total Length of Stay:  LOS: 9 days  Events:   No events Pulling fluid     BP (!) 113/54    Pulse 98    Temp (!) 96.2 F (35.7 C) (Axillary)    Resp (!) 26    Ht 4' 11.5" (1.511 m)    Wt 81.2 kg    SpO2 100%    BMI 35.55 kg/m   CVP:  [10 mmHg-34 mmHg] 23 mmHg  Vent Mode: SIMV;PRVC;PSV FiO2 (%):  [30 %-40 %] 40 % Set Rate:  [26 bmp] 26 bmp Vt Set:  [440 mL] 440 mL PEEP:  [5 cmH20] 5 cmH20 Pressure Support:  [10 cmH20-15 cmH20] 10 cmH20 Plateau Pressure:  [18 cmH20-24 cmH20] 18 cmH20    prismasol BGK 4/2.5 400 mL/hr at 01/10/22 1639    prismasol BGK 4/2.5 400 mL/hr at 01/10/22 1637   sodium chloride Stopped (01/08/22 1010)   sodium chloride 10 mL/hr at 01/10/22 2000   dexmedetomidine (PRECEDEX) IV infusion 0.2 mcg/kg/hr (01/10/22 2000)   epinephrine 6 mcg/min (01/10/22 2000)   lactated ringers     lactated ringers     lactated ringers     milrinone 0.125 mcg/kg/min (01/10/22 2000)   norepinephrine (LEVOPHED) Adult infusion 7 mcg/min (01/10/22 2000)   prismasol BGK 4/2.5 1,500 mL/hr at 01/10/22 1745   vasopressin 0.02 Units/min (01/10/22 2000)    I/O last 3 completed shifts: In: 3329.4 [I.V.:2394.4; NG/GT:935] Out: 8462 [Emesis/NG output:950; Drains:119; BULAG:5364; Chest Tube:100]   CBC Latest Ref Rng & Units 01/10/2022 01/09/2022 01/07/2022  WBC 4.0 - 10.5 K/uL 18.3(H) 17.1(H) 17.3(H)  Hemoglobin 12.0 - 15.0 g/dL 9.1(L) 9.1(L) 10.9(L)   Hematocrit 36.0 - 46.0 % 28.6(L) 28.3(L) 32.1(L)  Platelets 150 - 400 K/uL 52(L) 52(L) 123(L)    BMP Latest Ref Rng & Units 01/10/2022 01/10/2022 01/09/2022  Glucose 70 - 99 mg/dL 107(H) 99 263(H)  BUN 8 - 23 mg/dL 11 12 14   Creatinine 0.44 - 1.00 mg/dL 1.71(H) 1.82(H) 1.82(H)  BUN/Creat Ratio 6 - 22 (calc) - - -  Sodium 135 - 145 mmol/L 139 137 137  Potassium 3.5 - 5.1 mmol/L 4.4 4.2 4.2  Chloride 98 - 111 mmol/L 106 102 104  CO2 22 - 32 mmol/L 23 23 22   Calcium 8.9 - 10.3 mg/dL 9.0 9.2 8.7(L)    ABG    Component Value Date/Time   PHART 7.237 (L) 01/07/2022 0648   PCO2ART 46.1 01/07/2022 0648   PO2ART 65 (L) 01/07/2022 0648   HCO3 19.7 (L) 01/07/2022 0648   TCO2 21 (L) 01/07/2022 0648   ACIDBASEDEF 8.0 (H) 01/07/2022 0648   O2SAT 61.6 01/10/2022 6803       Melodie Bouillon, MD 01/10/2022 8:37 PM

## 2022-01-10 NOTE — Progress Notes (Addendum)
NAME:  Jody Nguyen, MRN:  [id number], DOB:  1956/11/18, LOS: 1 ADMISSION DATE:  01/23/2022, CONSULTATION DATE:  01/05/2022 REFERRING MD:  Martie Lee, TRH, CHIEF COMPLAINT:  SOB   History of Present Illness:  66 yo F PMH ESRD, non-obstructive CAD, suspected pulmonary hypertension, Hx PE on eliquis who presented to [facility] ED 2/4 with chest pain, which radiates to arm. Similar sx had also promoted ED presentation on 2/1 at which time trops were 100. She was seen by cardiology outpt on 2/2 and chest pain was felt to be related to volume shift as pain had occurred during dialysis.   On 2/4 presentation, Pt states that episodic chest pain has been intermittent for the past week. Non-exertional, lasts approx 10 minutes. Associated nausea and SOB. Non-specific EKG changes  & was d/w cardiology, ruled out for STEMI. Trop 200. Placed on [place] for SpO2 80s.Admitted to [facility] for obvs with concern for PE and started on heparin gtt. Awaiting VQ scan. ECHO has been performed which reveals Severe RV RA enlargement, severely reduced RV function and septal bowing, felt consistent with chronic process such as severe pulmonary HTN.    PCCM has been consulted in this setting. She remains critically ill.   Pertinent  Medical History  ESRD PE Pulmonary HTN  CAD CVA HLD HTN S/p MV repair  Pericardial effusion r/t minoxidil  Diastolic HF DM2  Significant Hospital Events: Including procedures, antibiotic start and stop dates in addition to other pertinent events   2/4 ED presentation with recurring chest pain -- STEMI ruled out. Stable, mildly hypoxic and placed on [place] 2/5 admit to [facility] for obvs. Concern for PE, ECHO performed by cardiology reportedly shows RV failure, but felt more consistent with chronic process like severe pulmHTN. Awaiting VQ scan. Pulm consulted  2/7 cardiac cath. 3 vessel dz. CABG rec 2/10 CABG, TVR 2/11 continued vasoplegic shock with very high vasopressor requirements  2/12 Given methylene blue  x 2  Interim History / Subjective:  Tmax 99.3  -5.2 L admit, -3.8L past 24 hours 173ml out of JP, 662ml emesis BG 83-263  LEVO 15 EPI 7.5 Milrinone 0.125 Vaso 0.04 Dex 0.2  Unable to obtain subjective evaluation due to patient status  Objective   Blood pressure (!) 113/54, pulse (!) 108, temperature (!) 96.3 F (35.7 C), resp. rate (!) 26, height 4' 11.5" (1.511 m), weight 81.2 kg, SpO2 100 %. CVP:  [11 mmHg-34 mmHg] 26 mmHg  Vent Mode: SIMV;PRVC;PSV FiO2 (%):  [30 %-40 %] 30 % Set Rate:  [26 bmp] 26 bmp Vt Set:  [440 mL] 440 mL PEEP:  [5 cmH20] 5 cmH20 Pressure Support:  [10 cmH20] 10 cmH20 Plateau Pressure:  [18 cmH20] 18 cmH20   Intake/Output Summary (Last 24 hours) at 01/10/2022 0730 Last data filed at 01/10/2022 0700 Gross per 24 hour  Intake 2650.83 ml  Output 6498 ml  Net -3847.17 ml   Filed Weights   01/05/22 2015 01/07/22 0600 01/08/22 0600  Weight: 74.4 kg 83.2 kg 81.2 kg    Examination: General: In bed, NAD, appears comfortable HEENT: MM pink/moist, anicteric, atraumatic Neuro: RASS -1, PERRL 94mm, wiggles toes to command CV: S1S2, ST, no m/r/g appreciated, right fistula with no bruit. PULM:  air movement in all lobes, trachea midline, chest expansion symmetric GI: soft, bsx4 active, non-tender   Extremities: warm/dry, generalized  edema, capillary refill less than 3 seconds  Skin: Post operative sites c/d/I, Jp drain to RLE,  no rashes or lesions noted  Ancillary tests personally reviewed:   CO-OX 71.3>61.6 WBC 17.1>18.3 HGB 9.1>9.1 PLT 123>52>52 HIT antibody: pending SRA: Pending Creat 2.15>1.82 AST 50>47 MAG 2.7 PTT: 40 INR: 1.5, PT 17.8 CXR stable lines and tubes, expected post op changes  Assessment & Plan:  Coronary artery disease: 3 vessel disease diagnosed on cardiac cath 2/7.  CABG x3 on 2/10 (POD 4) LIMA to LAD, SVG to PD, SVG to DIAG S/p CABG 2/10. Tricuspid regurgitation, severe: S/P TVR (POD 4) tricuspid annuloplasty ring in  place HX S/p MVR 2016 Acute on chronic systolic/diastolic CHF Cardiogenic shock: in the post cardiac surgery setting.  CI 2.2. CVP 26 Remains on vasopressors but weaning. COOX 61. -TCTS following appreciate assistance and management -Continue milrinone at 0.125 -Goal MAP 65. Wean Levo, epi, vaso to goal map. Once levo less than 20, wean vasopressin. -Continue CVVHD as below -Continue stress dose steroids   Acute respiratory failure with hypoxia  Pulmonary HTN  -LTVV strategy with tidal volumes of 4-8 cc/kg ideal body weight -Goal plateau pressures less than 30 and driving pressures less than 15 -Wean PEEP/FiO2 for SpO2 92-98% -VAP bundle -Daily SAT and SBT. Plan for SBT today -PAD bundle with Precedex gtt and fentanyl push -RASS goal 0 to -1 -Follow intermittent CXR and ABG PRN -Continue milrinone. Will resume home pulm HTN medications once extubated. Was on revatio and opsumit  ESRD on HD Infiltrated AVF Neph suspects clotted AVF -Nephrology following. Appreciate assistance -Address once no longer critically ill -Continue CRRT. Goal negative 50-100 ml/hr -AM BMP  HLD DM2 -Blood Glucose goal 140-180. -SSI -continue levemir 35 u  Acute blood loss anemia HGB 9.1>9.1. Suspect secondary to surgery and chronic kidney disease -Transfuse PRBC if HBG less than 7 -Obtain AM CBC to trend H&H -Monitor for signs of bleeding  Hx PE  Thrombocytopenia - HITT vs CPB related. PLT 123>52>52.   -Continue holding heparin  -Follow up SRA -Was on Ac for PE, will need ac at some point   Best Practice (right click and "Reselect all SmartList Selections" daily)   Diet/type: tubefeeds   DVT prophylaxis: SCD- on hold due to thrombocytopenia.  GI prophylaxis: PPI Lines: Central line, Dialysis Catheter, Arterial Line, and yes and it is still needed Foley:  Yes, and it is still needed Code Status:  full code Last date of multidisciplinary goals of care discussion [Family updated 2/10]  Will call today.  Critical care time: 36 minutes  Redmond School., MSN, APRN, AGACNP-BC [facility]  01/10/2022 , 7:31 AM  Please see [email or web address] for pager details  If no response, please call [phone] After hours, please call Elink at [phone]

## 2022-01-10 NOTE — Progress Notes (Signed)
Care Contact Phone Call  [place] spouse, Jadelyn Elks, at [phone] and [phone]. Was able to speak with Juanda Crumble on the phone, however he stated that he was on his was an preferred to be spoken to at the bedside. Will attempt to update at bedside if time permits.   Redmond School., MSN, APRN, AGACNP-BC [facility]  01/10/2022 , 10:25 AM  Please see [email or web address] for pager details  If no response, please call [phone] After hours, please call Elink at [phone]

## 2022-01-10 NOTE — Plan of Care (Signed)
°  Problem: Education: Goal: Knowledge of General Education information will improve Description: Including pain rating scale, medication(s)/side effects and non-pharmacologic comfort measures Outcome: Progressing   Problem: Health Behavior/Discharge Planning: Goal: Ability to manage health-related needs will improve Outcome: Progressing   Problem: Clinical Measurements: Goal: Ability to maintain clinical measurements within normal limits will improve Outcome: Progressing Goal: Will remain free from infection Outcome: Progressing Goal: Diagnostic test results will improve Outcome: Progressing Goal: Respiratory complications will improve Outcome: Progressing Goal: Cardiovascular complication will be avoided Outcome: Progressing   Problem: Activity: Goal: Risk for activity intolerance will decrease Outcome: Progressing   Problem: Coping: Goal: Level of anxiety will decrease Outcome: Progressing   Problem: Elimination: Goal: Will not experience complications related to bowel motility Outcome: Progressing Goal: Will not experience complications related to urinary retention Outcome: Progressing   Problem: Pain Managment: Goal: General experience of comfort will improve Outcome: Progressing   Problem: Safety: Goal: Ability to remain free from injury will improve Outcome: Progressing   Problem: Skin Integrity: Goal: Risk for impaired skin integrity will decrease Outcome: Progressing   Problem: Education: Goal: Will demonstrate proper wound care and an understanding of methods to prevent future damage Outcome: Progressing Goal: Knowledge of disease or condition will improve Outcome: Progressing Goal: Knowledge of the prescribed therapeutic regimen will improve Outcome: Progressing Goal: Individualized Educational Video(s) Outcome: Progressing   Problem: Activity: Goal: Risk for activity intolerance will decrease Outcome: Progressing   Problem: Cardiac: Goal: Will  achieve and/or maintain hemodynamic stability Outcome: Progressing   Problem: Clinical Measurements: Goal: Postoperative complications will be avoided or minimized Outcome: Progressing   Problem: Respiratory: Goal: Respiratory status will improve Outcome: Progressing   Problem: Skin Integrity: Goal: Wound healing without signs and symptoms of infection Outcome: Progressing Goal: Risk for impaired skin integrity will decrease Outcome: Progressing   Problem: Nutrition: Goal: Adequate nutrition will be maintained Outcome: Not Progressing

## 2022-01-10 NOTE — Progress Notes (Signed)
4 Days Post-Op Procedure(s) (LRB): CORONARY ARTERY BYPASS GRAFTING (CABG) TIMES THREE ON CARDIOPULMONARY BYPASS. LIMA TO LAD, SVG TO PD, SVG TO DIAG (N/A) TRICUSPID VALVE REPAIR USING AN 36MM EDWARDS MC3 TRICUSPID ANNULOPLASTY RING (N/A) TRANSESOPHAGEAL ECHOCARDIOGRAM (TEE) (N/A) APPLICATION OF CELL SAVER (N/A) ENDOVEIN HARVEST OF GREATER SAPHENOUS VEIN (Right) REPAIR OF PATENT FORAMEN OVALE (N/A) Subjective: Intubated, sedated  Objective: Vital signs in last 24 hours: Temp:  [94 F (34.4 C)-99.3 F (37.4 C)] 96.3 F (35.7 C) (02/14 0715) Pulse Rate:  [102-132] 108 (02/14 0600) Cardiac Rhythm: Sinus tachycardia (02/13 1940) Resp:  [14-26] 26 (02/14 0715) SpO2:  [97 %-100 %] 100 % (02/14 0600) Arterial Line BP: (80-141)/(42-85) 98/60 (02/14 0715) FiO2 (%):  [30 %-40 %] 30 % (02/14 0340)  Hemodynamic parameters for last 24 hours: CVP:  [11 mmHg-34 mmHg] 26 mmHg  Intake/Output from previous day: 02/13 0701 - 02/14 0700 In: 2650.8 [I.V.:1865.8; NG/GT:785] Out: 6498 [Emesis/NG output:600; Drains:119; Chest Tube:100] Intake/Output this shift: No intake/output data recorded.  General appearance: intubated Neurologic: non focal Heart: mild tachy, regular Lungs: coarse BS bilaterally Wound: clean and dry  Lab Results: Recent Labs    01/07/22 1609 01/09/22 0509  WBC 17.3* 17.1*  HGB 10.9* 9.1*  HCT 32.1* 28.3*  PLT 123* 52*   BMET:  Recent Labs    01/09/22 0509 01/09/22 1446  NA 134* 137  K 4.2 4.2  CL 102 104  CO2 22 22  GLUCOSE 255* 263*  BUN 16 14  CREATININE 2.15* 1.82*  CALCIUM 8.7* 8.7*    PT/INR:  Recent Labs    01/10/22 0623  LABPROT 17.8*  INR 1.5*   ABG    Component Value Date/Time   PHART 7.237 (L) 01/07/2022 0648   HCO3 19.7 (L) 01/07/2022 0648   TCO2 21 (L) 01/07/2022 0648   ACIDBASEDEF 8.0 (H) 01/07/2022 0648   O2SAT 61.6 01/10/2022 0623   CBG (last 3)  Recent Labs    01/09/22 2019 01/10/22 0112 01/10/22 0615  GLUCAP 220* 166*  68    Assessment/Plan: S/P Procedure(s) (LRB): CORONARY ARTERY BYPASS GRAFTING (CABG) TIMES THREE ON CARDIOPULMONARY BYPASS. LIMA TO LAD, SVG TO PD, SVG TO DIAG (N/A) TRICUSPID VALVE REPAIR USING AN 36MM EDWARDS MC3 TRICUSPID ANNULOPLASTY RING (N/A) TRANSESOPHAGEAL ECHOCARDIOGRAM (TEE) (N/A) APPLICATION OF CELL SAVER (N/A) ENDOVEIN HARVEST OF GREATER SAPHENOUS VEIN (Right) REPAIR OF PATENT FORAMEN OVALE (N/A) - Overall made significant progress over past 24 hours NEURO- sedated CV- sinus tachy. Co-ox 61 on significantly lower doses of pressors  Will keep milrinone @ 0.125, wean other drips as BP allows RESP- VDRF. CXR much improved this AM  D/w Dr. Lynetta Mare- will try SBT today RENAL- on CVVHD. Net 4L negative over past 24 hours  BMET pending GI- Tf on hold for emesis Thrombocytopenia- labs pending this AM Anemia- acute secondary to ABL- Hgb pending   LOS: 9 days    [name] 01/10/2022

## 2022-01-10 NOTE — Progress Notes (Signed)
[facility] Progress Note  Name: Jody Nguyen MRN: [id number] DOB: Apr 28, 1956   Subjective:  Patient seen and examined on CRRT. No acute events overnight. Pressors titrated down, tolerating UF w/ CRRT. Net neg ~3.8L.   Intake/Output Summary (Last 24 hours) at 01/10/2022 0746 Last data filed at 01/10/2022 0700 Gross per 24 hour  Intake 2650.83 ml  Output 6498 ml  Net -3847.17 ml    Vitals:  Vitals:   01/10/22 0600 01/10/22 0700 01/10/22 0715 01/10/22 0730  BP:      Pulse: (!) 108   (!) 101  Resp: 20 (!) 26 (!) 26 (!) 26  Temp: (!) 97.2 F (36.2 C) (!) 96.6 F (35.9 C) (!) 96.3 F (35.7 C) (!) 96.6 F (35.9 C)  TempSrc:      SpO2: 100%   98%  Weight:      Height:         Physical Exam:  General adult female in bed intubated critically ill  HEENT normocephalic atraumatic; lips / face swollen  Neck supple trachea midline Lungs coarse mechanical breaths sounds; FIO2 40 and PEEP 5 Heart S1S2 tachycardic at 122  Abdomen soft obese habitus Extremities 1-2+ diffuse edema; right leg is wrapped with drain  Neuro - sedated Access: right arm AVF in place, no bruit or thrill; left fem temp HD line c/d/i  Medications reviewed   Labs:  BMP Latest Ref Rng & Units 01/10/2022 01/09/2022 01/09/2022  Glucose 70 - 99 mg/dL 99 263(H) 255(H)  BUN 8 - 23 mg/dL 12 14 16   Creatinine 0.44 - 1.00 mg/dL 1.82(H) 1.82(H) 2.15(H)  BUN/Creat Ratio 6 - 22 (calc) - - -  Sodium 135 - 145 mmol/L 137 137 134(L)  Potassium 3.5 - 5.1 mmol/L 4.2 4.2 4.2  Chloride 98 - 111 mmol/L 102 104 102  CO2 22 - 32 mmol/L 23 22 22   Calcium 8.9 - 10.3 mg/dL 9.2 8.7(L) 8.7(L)   Dialysis Orders: MWF at [facility] 3:15hr, 450/A1.5, EDW 75.5kg, 2K/2Ca, UFP #2, AVF, heparin 2500 unit bolus - Hectoral 79mcg IV q HD - Sensipar 90mg  PO q HD - Mircera 153mcg IV q 2 weeks (ordered, not yet given. Last Mircera 100 on 12/07/21)   Assessment/Plan:   CAD s/p CABG x3, TR sp TVR:  per CT surgery   ESRD:  normally  MWF schedule, last IHD Thursday 2/09. Remains on pressors x 3. CRRT started 2/11, remains critically ill. Cont CRRT.  Infiltrated AVF - AVF infiltrated on Tuesday. Expert cannulator able to stick 2/09 and completed full treatment. Postop no bruit today again, suspect has clotted. Postpone this issue to when more stable, will also likely need a TDC Hypotension - 3 pressors and milrinone. Pressor support per primary service Volume: hx of HTN now in shock post-op. Vol up on exam, will aim for net neg 50-100cc/hr w/ CRRT  Anemia CKD : s/p PRBC's , Hb down to 9.1 yesterday  Metabolic bone disease: resume home binders and sensipar when taking PO. Cont IV vdra.   Pulm HTN:  Followed at [facility], on Revatio and opsumit  T2DM-per primary service  Hx of MV replacement - in 2016  Gean Quint, MD 01/10/2022 7:46 AM

## 2022-01-10 NOTE — Op Note (Signed)
Jody Nguyen, TOOTHAKER MEDICAL RECORD NO: [id number] ACCOUNT NO: [id number] DATE OF BIRTH: 05/28/1956 FACILITY: [facility] LOCATION: [facility] PHYSICIAN: Revonda Standard. Roxan Hockey, MD  Operative Report   DATE OF PROCEDURE: 12/28/2021   PREOPERATIVE DIAGNOSIS:  Three-vessel coronary artery disease, severe tricuspid regurgitation.  POSTOPERATIVE DIAGNOSIS:  Severe 3-vessel coronary artery disease, severe tricuspid regurgitation, patent foramen ovale.   PROCEDURE:   Median sternotomy, extracorporeal circulation,  Coronary artery bypass grafting x 3  Left internal mammary artery to LAD,  Saphenous vein graft to diagonal,  Saphenous vein graft to posterior descending,  Tricuspid valve annuloplasty with 36 mm Edwards MC3 ring,  Closure of patent foramen ovale.   Endoscopic vein harvest, right leg.  SURGEON:  Dr. Modesto Charon.  ASSISTANT:  Ellwood Handler, PA   ANESTHESIA:  General.  FINDINGS:  Transesophageal echocardiography showed moderate mitral stenosis, severe tricuspid regurgitation. LAD and diagonal good quality targets.  Posterior descending poor quality target.  Good quality conduits.  Moderate to severe residual tricuspid  Regurgitation.  Small patent foramen ovale with no residual shunt post-bypass.  INDICATIONS:  Jody Nguyen a 66 year old woman with a history of mitral valve repair.  She also has a history of end-stage renal failure and is on hemodialysis.  She was admitted with acute chest pain with a complicated postoperative course requiring  extracorporeal membrane oxygenation.  Her medical history is also significant for end-stage renal disease, on hemodialysis, diabetes, pulmonary hypertension, supraventricular tachycardia and prior stroke.  She presented with unstable chest pain and was  found to have severe 3-vessel disease with chronic total occlusion of the right coronary artery and circumflex and tight stenoses and LAD and large diagonal branch.  Echocardiogram showed  ejection fraction of 40-45%.  There was mild right ventricular  systolic dysfunction and severe tricuspid regurgitation.  After consideration, she was offered the option of sternotomy for coronary artery bypass grafting and tricuspid valve repair.  The indications, risks, benefits, and alternatives were discussed in  detail with the patient.  She understood and accepted the risks and agreed to proceed.  PROCEDURE: Jody Nguyen was brought to the preoperative holding area on 01/14/2022.  Anesthesia placed a Swan-Ganz catheter, but were not able to float it into the pulmonary artery.  They also attempted to place an arterial blood pressure line in the left  radial artery, but that was unsuccessful.  She was taken to the operating room and anesthetized and intubated.  Foley catheter was placed.  Intravenous antibiotics were administered.  Transesophageal echocardiography was performed.  Please see the  separately dictated note for full details of that procedure.  The chest, abdomen and legs were prepped and draped in the usual sterile fashion.  A timeout was performed, a right femoral arterial line was placed using the Seldinger technique.  After changing gloves, a median sternotomy then was performed.  Initial hemostasis was achieved.  There was significant venous bleeding. Sternal retractor was  placed and was gradually opened over time.  There were significant adhesions in the pericardium.  The pericardial edge was identified and the dissection was begun.  The ascending aorta was dissected out and the anterior part of the heart was dissected  out as well as the anterior portion of the right atrium.  There was massive right atrial enlargement.  The patient was fully heparinized.  After confirming adequate anticoagulation with ACT measurement the aorta was cannulated via concentric 2-0 Ethibond  pledgeted pursestring sutures.  A 28-French venous cannula was placed via a pursestring suture in the  right  atrial appendage.  Cardiopulmonary bypass was initiated.  Dissection continued on the right atrium and as the pericardium was being dissected off  the right atrium, there were two significant tears.  These were repaired with 5-0 Prolene sutures.  There was significant bleeding with that, but it was given back via the bypass pump.  Once the right atrium had been dissected out, a pursestring suture  was placed in the inferior aspect of the right atrium and a 36-French venous cannula was placed through this pursestring suture and directed into the inferior vena cava.  The 28-French cannula then was repositioned into the superior vena cava.  Caval  tapes were placed.  They were only tightened during the tricuspid repair portion of the procedure. The remainder of the pericardial adhesions were taken down.  These were quite significant and this was a relatively slow and tedious process.  A retrograde  cardioplegia cannula was placed via pursestring suture in the right atrium and directed into the coronary sinus.  An antegrade cardioplegia cannula was placed in the ascending aorta. Simultaneously with the sternotomy, Erin Barrett performed endoscopic  vein harvest from the right leg.  The larger portion of the vein from the thigh was of good quality.  The vein from the lower thigh and upper calf was a fair quality.  There was a segment where the vessel bifurcated and this was excised and an end-to-end anastomosis  was performed.  This vein was used for the posterior descending. Carbon dioxide was insufflated into the operative field.  The aorta was cross clamped.  The left ventricle was emptied via the aortic root vent.  Cardiac arrest then was achieved with a combination of cold antegrade and retrograde KBC blood cardioplegia and topical iced saline.  Initial cardioplegia dose was  given antegrade until there was a diastolic arrest and the remainder was given via the retrograde cannula.  A reversed saphenous  vein graft then was placed end-to-side to the posterior descending branch of the right coronary.  This vessel was diffusely diseased and turned out to be a small, 1 mm, poor quality target vessel.  The vein was anastomosed  end-to-side with a running 7-0 Prolene suture.  A probe did pass distally at the completion of the anastomosis.  Proximally, there was a significant plaque.  Cardioplegia was administered down the graft and there was good hemostasis.  Next, a reversed saphenous vein graft was placed end-to-side to the first diagonal branch of the LAD.  This was a dominant anterolateral branch.  There was no graftable target in the circumflex distribution per se, but the lateral wall was being supplied  by this large diagonal branch.  It was a 1.5 mm good quality target.  An end-to-side anastomosis was performed with a running 7-0 Prolene suture.  Again, cardioplegia was administered down the graft with good flow and good hemostasis.  The left internal mammary artery was brought through a window in the pericardium.  It was a relatively short vessel, but the LAD could be grafted in its midportion.  It was a 1.5 mm good quality target at that site.  The mammary was a good quality vessel.  An end-to-side anastomosis was performed with a running 8-0 Prolene suture.  At the completion of the mammary to LAD anastomosis, the bulldog clamp was removed.  Rapid septal rewarming was noted.  The bulldog clamp was replaced and the mammary pedicle  was tacked to the epicardial surface of the heart with 6-0 Prolene sutures.  The proximal vein graft anastomoses then were performed to 4.5 mm punch aortotomies with running 6-0 Prolene sutures.  A reanimation dose of KBC cardioplegia was administered.  De-airing was performed and the aortic crossclamp was removed.  The total  crossclamp time was 71 minutes.  Rewarming was initiated.  The caval tapes were tightened.  A right atriotomy was performed.  The tricuspid valve  was inspected.  The leaflets were intact.  There was annular dilatation. 2-0 Ethibond horizontal mattress sutures were placed  circumferentially around the annulus.  An Edwards MC3 annuloplasty ring was chosen.  The annular sutures measured for a 36 mm ring.  The sutures were placed through the ring, which was lowered into place and the sutures were sequentially tied.  The small  patent foramen ovale was closed with a 4-0 Prolene pledgeted suture.  The Swan-Ganz catheter was placed back through the tricuspid valve into the right ventricle, but it could not be palpated in the pulmonary artery.  The right atriotomy then was closed  in 2 layers, first was a running 4-0 Prolene horizontal mattress suture.  De-airing was performed after completion of this layer before tying the suture, then the second layer of running 4-0 Prolene simple suture was placed.  The retrograde cardioplegia  cannula was removed and that suture was tied.  The superior vena caval cannula was repositioned into the right atrium, and the inferior vena cava cannula was removed.  A 4-0 Prolene pledgeted suture was used at that site for hemostasis.  Epicardial pacing  wires were placed on the right ventricle and right atrium.  The patient was loaded with milrinone and then started on an infusion.  She also was started on norepinephrine and epinephrine infusions.  Rewarming was relatively slow, but when she finally  rewarmed to a core temperature of 37 degrees Celsius, she was weaned from cardiopulmonary bypass on the first attempt.  Total bypass time was 251 minutes.  The cardiac index was not reliable as a Swan-Ganz catheter was not in the pulmonary artery.   Multiple attempts were made to advance the Swan into the pulmonary artery, but these were unsuccessful.  Post-bypass transesophageal echocardiography revealed residual tricuspid regurgitation.  There was some right ventricular dysfunction, which was  mild, consistent with the prebypass  study and no change in the left ventricular function, ejection fraction of approximately 40-45%.  Patient did require initiation of vasopressin infusion also for hypotension in the post-bypass period.  A test dose of protamine was administered and was well tolerated.  The atrial and aortic cannulae were removed.  The remainder of the protamine was administered without incident.  The patient was thrombocytopenic and platelets were administered.   Initially, there was a significant ongoing coagulopathic bleeding.  The patient ultimately received 6 units of packed red blood cells in addition to Cell Saver, 2 units of FFP and 2 bags of platelets.  The pericardium was not closed.  Left pleural and  mediastinal chest tubes were placed through separate subcostal incisions.  The sternum then was closed with a combination of single and double heavy gauge stainless steel wires.  Pectoralis fascia, subcutaneous tissue and skin were closed in standard  fashion.  The patient was observed in the operating room for about an hour post-closure. When the bleeding had slowed, the patient was transported from the operating room to the surgical intensive care unit, intubated and in critical condition.  All sponge, needle and instrument counts were correct at the end of the procedure.  An experienced assistance was necessary for this case due to complexity.  Ellwood Handler, PA performed that role.  She independently harvested the saphenous vein, also assisted with the dissection with retraction, exposure and suctioning, suture management  during the anastomoses and the tricuspid repair.     SUJ D: 01/09/2022 5:55:15 pm T: 01/10/2022 12:43:00 am  JOB: [id number]/ [id number]

## 2022-01-11 ENCOUNTER — Inpatient Hospital Stay (HOSPITAL_COMMUNITY): Payer: Medicare Other

## 2022-01-11 DIAGNOSIS — J9601 Acute respiratory failure with hypoxia: Secondary | ICD-10-CM

## 2022-01-11 LAB — GLUCOSE, CAPILLARY
Glucose-Capillary: 108 mg/dL — ABNORMAL HIGH (ref 70–99)
Glucose-Capillary: 109 mg/dL — ABNORMAL HIGH (ref 70–99)
Glucose-Capillary: 126 mg/dL — ABNORMAL HIGH (ref 70–99)
Glucose-Capillary: 126 mg/dL — ABNORMAL HIGH (ref 70–99)
Glucose-Capillary: 130 mg/dL — ABNORMAL HIGH (ref 70–99)
Glucose-Capillary: 87 mg/dL (ref 70–99)
Glucose-Capillary: 93 mg/dL (ref 70–99)

## 2022-01-11 LAB — CBC WITH DIFFERENTIAL/PLATELET
Abs Immature Granulocytes: 0.52 10*3/uL — ABNORMAL HIGH (ref 0.00–0.07)
Basophils Absolute: 0.1 10*3/uL (ref 0.0–0.1)
Basophils Relative: 0 %
Eosinophils Absolute: 0 10*3/uL (ref 0.0–0.5)
Eosinophils Relative: 0 %
HCT: 27.3 % — ABNORMAL LOW (ref 36.0–46.0)
Hemoglobin: 8.9 g/dL — ABNORMAL LOW (ref 12.0–15.0)
Immature Granulocytes: 2 %
Lymphocytes Relative: 9 %
Lymphs Abs: 2.1 10*3/uL (ref 0.7–4.0)
MCH: 29.2 pg (ref 26.0–34.0)
MCHC: 32.6 g/dL (ref 30.0–36.0)
MCV: 89.5 fL (ref 80.0–100.0)
Monocytes Absolute: 1.4 10*3/uL — ABNORMAL HIGH (ref 0.1–1.0)
Monocytes Relative: 6 %
Neutro Abs: 18.9 10*3/uL — ABNORMAL HIGH (ref 1.7–7.7)
Neutrophils Relative %: 83 %
Platelets: 49 10*3/uL — ABNORMAL LOW (ref 150–400)
RBC: 3.05 MIL/uL — ABNORMAL LOW (ref 3.87–5.11)
RDW: 20.4 % — ABNORMAL HIGH (ref 11.5–15.5)
WBC: 23 10*3/uL — ABNORMAL HIGH (ref 4.0–10.5)
nRBC: 23.2 % — ABNORMAL HIGH (ref 0.0–0.2)

## 2022-01-11 LAB — COMPREHENSIVE METABOLIC PANEL
ALT: 7 U/L (ref 0–44)
AST: 50 U/L — ABNORMAL HIGH (ref 15–41)
Albumin: 3.2 g/dL — ABNORMAL LOW (ref 3.5–5.0)
Alkaline Phosphatase: 100 U/L (ref 38–126)
Anion gap: 12 (ref 5–15)
BUN: 12 mg/dL (ref 8–23)
CO2: 21 mmol/L — ABNORMAL LOW (ref 22–32)
Calcium: 9 mg/dL (ref 8.9–10.3)
Chloride: 104 mmol/L (ref 98–111)
Creatinine, Ser: 1.61 mg/dL — ABNORMAL HIGH (ref 0.44–1.00)
GFR, Estimated: 35 mL/min — ABNORMAL LOW (ref >60)
Glucose, Bld: 126 mg/dL — ABNORMAL HIGH (ref 70–99)
Potassium: 4.7 mmol/L (ref 3.5–5.1)
Sodium: 137 mmol/L (ref 135–145)
Total Bilirubin: 1.4 mg/dL — ABNORMAL HIGH (ref 0.3–1.2)
Total Protein: 5.8 g/dL — ABNORMAL LOW (ref 6.5–8.1)

## 2022-01-11 LAB — APTT
aPTT: 69 seconds — ABNORMAL HIGH (ref 24–36)
aPTT: 82 seconds — ABNORMAL HIGH (ref 24–36)

## 2022-01-11 LAB — RENAL FUNCTION PANEL
Albumin: 3.3 g/dL — ABNORMAL LOW (ref 3.5–5.0)
Anion gap: 10 (ref 5–15)
BUN: 10 mg/dL (ref 8–23)
CO2: 24 mmol/L (ref 22–32)
Calcium: 9.2 mg/dL (ref 8.9–10.3)
Chloride: 103 mmol/L (ref 98–111)
Creatinine, Ser: 1.42 mg/dL — ABNORMAL HIGH (ref 0.44–1.00)
GFR, Estimated: 41 mL/min — ABNORMAL LOW (ref >60)
Glucose, Bld: 87 mg/dL (ref 70–99)
Phosphorus: 2.5 mg/dL (ref 2.5–4.6)
Potassium: 4.5 mmol/L (ref 3.5–5.1)
Sodium: 137 mmol/L (ref 135–145)

## 2022-01-11 LAB — PHOSPHORUS: Phosphorus: 2.6 mg/dL (ref 2.5–4.6)

## 2022-01-11 LAB — PROTIME-INR
INR: 1.5 — ABNORMAL HIGH (ref 0.8–1.2)
Prothrombin Time: 18 seconds — ABNORMAL HIGH (ref 11.4–15.2)

## 2022-01-11 LAB — COOXEMETRY PANEL
Carboxyhemoglobin: <2.2 % — ABNORMAL HIGH (ref 0.5–1.5)
Methemoglobin: <0.2 % (ref 0.0–1.5)
O2 Saturation: 62.4 %
Total hemoglobin: 8.5 g/dL — ABNORMAL LOW (ref 12.0–16.0)

## 2022-01-11 LAB — MAGNESIUM: Magnesium: 2.6 mg/dL — ABNORMAL HIGH (ref 1.7–2.4)

## 2022-01-11 MED ORDER — INSULIN DETEMIR 100 UNIT/ML ~~LOC~~ SOLN
15.0000 [IU] | Freq: Two times a day (BID) | SUBCUTANEOUS | Status: DC
  Administered 2022-01-11 – 2022-01-15 (×10): 15 [IU] via SUBCUTANEOUS
  Filled 2022-01-11 (×13): qty 0.15

## 2022-01-11 MED ORDER — VITAL 1.5 CAL PO LIQD
1000.0000 mL | ORAL | Status: DC
  Administered 2022-01-11 – 2022-01-18 (×7): 1000 mL

## 2022-01-11 MED ORDER — HYDROCORTISONE SOD SUC (PF) 100 MG IJ SOLR
50.0000 mg | Freq: Two times a day (BID) | INTRAMUSCULAR | Status: AC
  Administered 2022-01-11 – 2022-01-12 (×4): 50 mg via INTRAVENOUS
  Filled 2022-01-11 (×4): qty 2

## 2022-01-11 MED ORDER — PROSOURCE TF PO LIQD
45.0000 mL | Freq: Three times a day (TID) | ORAL | Status: DC
  Administered 2022-01-11 – 2022-01-13 (×6): 45 mL
  Filled 2022-01-11 (×6): qty 45

## 2022-01-11 MED ORDER — SODIUM CHLORIDE 0.9 % IV SOLN
0.0150 mg/kg/h | INTRAVENOUS | Status: DC
  Administered 2022-01-11: 0.05 mg/kg/h via INTRAVENOUS
  Administered 2022-01-13: 0.02 mg/kg/h via INTRAVENOUS
  Filled 2022-01-11: qty 250

## 2022-01-11 MED FILL — Sodium Bicarbonate IV Soln 8.4%: INTRAVENOUS | Qty: 50 | Status: AC

## 2022-01-11 MED FILL — Albumin, Human Inj 5%: INTRAVENOUS | Qty: 250 | Status: AC

## 2022-01-11 MED FILL — Calcium Chloride Inj 10%: INTRAVENOUS | Qty: 10 | Status: AC

## 2022-01-11 MED FILL — Mannitol IV Soln 20%: INTRAVENOUS | Qty: 500 | Status: AC

## 2022-01-11 MED FILL — Heparin Sodium (Porcine) Inj 1000 Unit/ML: INTRAMUSCULAR | Qty: 20 | Status: AC

## 2022-01-11 MED FILL — Sodium Chloride IV Soln 0.9%: INTRAVENOUS | Qty: 4000 | Status: AC

## 2022-01-11 MED FILL — Electrolyte-R (PH 7.4) Solution: INTRAVENOUS | Qty: 8000 | Status: AC

## 2022-01-11 NOTE — Progress Notes (Addendum)
Nutrition Follow-up  DOCUMENTATION CODES:   Not applicable  INTERVENTION:   Plan for Cortrak today  Tube Feeding via Cortrak:  Change to Vital 1.5 at 45 ml/hr Add Pro-Source TF 45 mL TID Goal regimen provides 1740 kcals, 106 g of protein and 821 mL of free water  Continue Renal MVI daily  NUTRITION DIAGNOSIS:   Inadequate oral intake related to inability to eat as evidenced by NPO status.  Being addressed via TF   GOAL:   Provide needs based on ASPEN/SCCM guidelines  Progressing  MONITOR:   TF tolerance, Vent status, I & O's, Labs  REASON FOR ASSESSMENT:   Consult Enteral/tube feeding initiation and management  ASSESSMENT:   66 y.o. female with history of HTN, DM type 2, HKD, pulmonary HTN, ESRD on HD, CHF, hx CVA, and vitamin D deficiency, presented to ED with radiating chest pain, SOB, and nausea.  2/07 Left heart catherization, showed severe coronary calcification and multivessel CAD. 2/10  CABG x 3, TVR 2/11  CRRT initiated 2/13 TF initiated 2/14 TF on  hold for emesis  Remains on vent support, remains on levophed and epinpehrine, off vasoporessin Remains on CRRT  TF held yesterday for emesis, remained on hold during weaning yesterday. Plan to resume today if unable to extubate  EDW 75.5 kg; current wt 76.1 kg. Noted wt of 74.4 kg on 2/09 prior to surgery.   Pt previously constipated, now with rectal tube (inserted 2/14) with liquid stool  Labs: CBGs 93-126, phosphorus 2.6 (wdl), potassium 4.7 (wdl) Meds: dulcolax, colace, hectoral, ss novologl, novolog q 4 hours, levemir, Rena-vit   Diet Order:   Diet Order     None       EDUCATION NEEDS:   Not appropriate for education at this time  Skin:  Skin Assessment: Reviewed RN Assessment (surgical incisions to the chest and right leg)  Last BM:  2/15 rectal tube  Height:   Ht Readings from Last 1 Encounters:  01/01/22 4' 11.5" (1.511 m)    Weight:   Wt Readings from Last 1 Encounters:   01/11/22 76.1 kg    Ideal Body Weight:  45.5 kg  BMI:  Body mass index is 33.32 kg/m.  Estimated Nutritional Needs:   Kcal:  1600-1800 kcals  Protein:  105-130 g  Fluid:  1L+UOP   Kerman Passey MS, RDN, LDN, CNSC Registered Dietitian III Clinical Nutrition RD Pager and On-Call Pager Number Located in [place]

## 2022-01-11 NOTE — Progress Notes (Addendum)
[place] for bivalirudin Indication: history of pulmonary embolus  Allergies  Allergen Reactions   Minoxidil Other (See Comments)    Pericardial effusion   Lipitor [Atorvastatin] Other (See Comments)    MYALGIAS > "pain in legs" Tolerates rosuvastatin   Morphine And Related Itching   Ace Inhibitors Other (See Comments)    REACTION: "not sure...think it made me drowsy all the time"    Patient Measurements: Height: 4' 11.5" (151.1 cm) Weight: 76.1 kg (167 lb 12.3 oz) IBW/kg (Calculated) : 44.35  Vital Signs: Temp: 96.4 F (35.8 C) (02/15 0748) Temp Source: Axillary (02/15 0748) Pulse Rate: 95 (02/15 1600)  Labs: Recent Labs    01/09/22 0509 01/09/22 1446 01/10/22 0623 01/10/22 1509 01/11/22 0418 01/11/22 1609  HGB 9.1*  --  9.1*  --  8.9*  --   HCT 28.3*  --  28.6*  --  27.3*  --   PLT 52*  --  52*  --  49*  --   APTT 41*  --  40*  --   --  69*  LABPROT 20.1*  --  17.8*  --  18.0*  --   INR 1.7*  --  1.5*  --  1.5*  --   CREATININE 2.15*   < > 1.82* 1.71* 1.61*  --    < > = values in this interval not displayed.     Estimated Creatinine Clearance: 31 mL/min (A) (by C-G formula based on SCr of 1.61 mg/dL (H)).   Assessment: 66 yo female on chronic apixaban for hx of PE.  This has been held for surgery.  Pharmacy asked to resume anticoagulation 2/15 with bivalirudin.  Platelet count low, but HIT antibody negative.  Hgb low but fairly stable, Pltc 49. Pacing wires pulled about 930 AM today - asked to start bivalirudin 2 hrs later. Currently on CRRT - will need reduced bivalirudin dosing.  aPTT therapeutic at 69 sec; no issue per discussion with RN.  Goal of Therapy:  Official aPTT goal 50-85 sec - but will target lower end given low platelet count/risk of post-operative bleeding Monitor platelets by anticoagulation protocol: Yes   Plan:  Continue IV bivalirudin at 0.05 mg/kg/hr  Recheck aPTT in 4 hrs   Krissi Willaims D. Mina Marble,  PharmD, BCPS, [place] 01/11/2022, 5:09 PM   ==========================  Addendum: Repeat aPTT level supra-therapeutic at 82 sec; no issue nor bleeding per RN Reduce bivalirudin to 0.04 units/kg/hr Check aPTT in 4 hrs  Taye Cato D. Mina Marble, PharmD, BCPS, [place] 01/11/2022, 9:16 PM

## 2022-01-11 NOTE — Procedures (Signed)
Cortrak  Person Inserting Tube:  Alroy Dust, Ellorie Kindall L, RD Tube Type:  Cortrak - 43 inches Tube Size:  10 Tube Location:  Left nare Initial Placement:  Stomach Secured by: Bridle Technique Used to Measure Tube Placement:  Marking at nare/corner of mouth Cortrak Secured At:  69 cm  Cortrak Tube Team Note:  Consult received to place a Cortrak feeding tube.   X-ray is required, abdominal x-ray has been ordered by the Cortrak team. Please confirm tube placement before using the Cortrak tube.   If the tube becomes dislodged please keep the tube and contact the Cortrak team at [email or web address] ([email or web address]) for replacement.  If after hours and replacement cannot be delayed, place a NG tube and confirm placement with an abdominal x-ray.    Roxana Hires, RD, LDN Clinical Dietitian See [facility], [place] for contact information.

## 2022-01-11 NOTE — Progress Notes (Signed)
ANTICOAGULATION CONSULT NOTE - Initial Consult  Pharmacy Consult for bivalirudin Indication: hx pulmonary embolus  Allergies  Allergen Reactions   Minoxidil Other (See Comments)    Pericardial effusion   Lipitor [Atorvastatin] Other (See Comments)    MYALGIAS > "pain in legs" Tolerates rosuvastatin   Morphine And Related Itching   Ace Inhibitors Other (See Comments)    REACTION: "not sure...think it made me drowsy all the time"    Patient Measurements: Height: 4' 11.5" (151.1 cm) Weight: 76.1 kg (167 lb 12.3 oz) IBW/kg (Calculated) : 44.35 Heparin Dosing Weight:   Vital Signs: Temp: 96.4 F (35.8 C) (02/15 0748) Temp Source: Axillary (02/15 0748) Pulse Rate: 110 (02/15 1000)  Labs: Recent Labs    01/09/22 0509 01/09/22 1446 01/10/22 0623 01/10/22 1509 01/11/22 0418  HGB 9.1*  --  9.1*  --  8.9*  HCT 28.3*  --  28.6*  --  27.3*  PLT 52*  --  52*  --  49*  APTT 41*  --  40*  --   --   LABPROT 20.1*  --  17.8*  --  18.0*  INR 1.7*  --  1.5*  --  1.5*  CREATININE 2.15*   < > 1.82* 1.71* 1.61*   < > = values in this interval not displayed.    Estimated Creatinine Clearance: 31 mL/min (A) (by C-G formula based on SCr of 1.61 mg/dL (H)).   Medical History: Past Medical History:  Diagnosis Date   Anemia    Chronic diastolic CHF (congestive heart failure) (HCC)    Chronic diastolic heart failure (HCC)    CKD (chronic kidney disease) stage 4, GFR 15-29 ml/min (HCC)    dialysis M/W/F   Constipation    CVA (cerebral infarction) 1997   no residual deficit   Diverticulitis    DM (diabetes mellitus) (HCC)    type 2   Heart murmur    History of cardiovascular stress test    a. Myoview Oct 2012 showed EF 49%, no ischemia, LVE   HLD (hyperlipidemia)    takes Crestor daily   Hypertension    Pericardial effusion    chronic; felt to be poss related to minoxidil >> DC'd   Pulmonary hypertension ([place]) 06/18/2015   Respiratory failure, acute hypoxic, post-operative  07/08/2015   Requiring ECMO support   S/P cardiac catheterization    a. R/L [facility] 06/18/15:  mLAD 30%; severe pulmo HTN with PA sat 43%, CI 1.86, prominent V waves indicative of MR; resting hypoxemia O2 sat 86% on RA   S/P minimally invasive mitral valve repair 07/08/2015   Complex valvuloplasty including artificial Gore-tex neochord placement x10 and 26 mm Sorin Memo 3D Rechord ring annuloplasty via right mini thoracotomy approach   Severe mitral regurgitation    Shortness of breath dyspnea    Stroke ([place]) 1997   no residual effect   Vitamin D deficiency    Wears glasses     Medications:  Infusions:    prismasol BGK 4/2.5 400 mL/hr at 01/11/22 0629    prismasol BGK 4/2.5 400 mL/hr at 01/11/22 0614   sodium chloride Stopped (01/08/22 1010)   sodium chloride 10 mL/hr at 01/11/22 1000   bivalirudin (ANGIOMAX) infusion 0.5 mg/mL (Non-ACS indications)     dexmedetomidine (PRECEDEX) IV infusion 0.4 mcg/kg/hr (01/11/22 1000)   epinephrine 6 mcg/min (01/11/22 1000)   lactated ringers     lactated ringers     lactated ringers     milrinone 0.125 mcg/kg/min (01/11/22 1000)  norepinephrine (LEVOPHED) Adult infusion 6 mcg/min (01/11/22 1000)   prismasol BGK 4/2.5 1,500 mL/hr at 01/11/22 0755   vasopressin 0.01 Units/min (01/11/22 1000)    Assessment: 66 yo female on chronic apixaban for hx of PE.  This has been held for surgery.  Pharmacy asked to resume anticoagulation today with bivalirudin.  Platelet count low, but HIT antibody negative.  Hgb low but fairly stable, Pltc 49.  Pacing wires pulled about 930 AM today - asked to start bivalirudin 2 hrs later.  Currently on CRRT - will need reduced bivalirudin dosing.  Goal of Therapy:  Official aPTT goal 50-85 - but will target lower end given low platelet count/risk of post-operative bleeding Monitor platelets by anticoagulation protocol: Yes   Plan:  Start IV bivalirudin at 0.05 mg/kg/hr at noon today. Check aPTT 4 hrs after  bivalirudin starts. Daily aPTT and CBC. F/u plans for oral anticoagulation eventually.  [place] Crane, Roylene Reason, BCCP Clinical Pharmacist  01/11/2022 10:45 AM   [facility] pharmacy phone numbers are listed on [email or web address]

## 2022-01-11 NOTE — Progress Notes (Signed)
[facility] Progress Note  Name: Jody Nguyen MRN: [id number] DOB: Sep 12, 1956   Subjective:  Patient seen and examined on CRRT. No acute events overnight. Clotted again overnight. Remains on epi, vaso, levo, milrinone Net neg ~2L  Intake/Output Summary (Last 24 hours) at 01/11/2022 0807 Last data filed at 01/11/2022 0700 Gross per 24 hour  Intake 1366.55 ml  Output 3121 ml  Net -1754.45 ml    Vitals:  Vitals:   01/11/22 0630 01/11/22 0645 01/11/22 0700 01/11/22 0748  BP:      Pulse: (!) 112 (!) 111 (!) 110   Resp: (!) 23 17 (!) 27   Temp:    (!) 96.4 F (35.8 C)  TempSrc:    Axillary  SpO2: 100% 100% 100%   Weight:      Height:         Physical Exam:  General adult female in bed intubated critically ill  Lungs: cta bl Heart S1S2  Abdomen soft obese Extremities: generalized edema Neuro - awakens to vocal stim, following some commands Access: right arm AVF in place, no bruit or thrill; left fem temp HD line c/d/i  Medications reviewed   Labs:  BMP Latest Ref Rng & Units 01/11/2022 01/10/2022 01/10/2022  Glucose 70 - 99 mg/dL 126(H) 107(H) 99  BUN 8 - 23 mg/dL 12 11 12   Creatinine 0.44 - 1.00 mg/dL 1.61(H) 1.71(H) 1.82(H)  BUN/Creat Ratio 6 - 22 (calc) - - -  Sodium 135 - 145 mmol/L 137 139 137  Potassium 3.5 - 5.1 mmol/L 4.7 4.4 4.2  Chloride 98 - 111 mmol/L 104 106 102  CO2 22 - 32 mmol/L 21(L) 23 23  Calcium 8.9 - 10.3 mg/dL 9.0 9.0 9.2   Dialysis Orders: MWF at [facility] 3:15hr, 450/A1.5, EDW 75.5kg, 2K/2Ca, UFP #2, AVF, heparin 2500 unit bolus - Hectoral 17mcg IV q HD - Sensipar 90mg  PO q HD - Mircera 145mcg IV q 2 weeks (ordered, not yet given. Last Mircera 100 on 12/07/21)   Assessment/Plan:   CAD s/p CABG x3, TR sp TVR:  per CT surgery   ESRD:  normally MWF schedule, last IHD Thursday 2/09. Remains on pressors x 3. CRRT started 2/11, remains critically ill. Cont CRRT.  Infiltrated AVF - AVF infiltrated on Tuesday. Expert cannulator able to  stick 2/09 and completed full treatment. Postop no bruit today again, suspect has clotted. Postpone this issue to when more stable, will also likely need a TDC Hypotension - 3 pressors and milrinone. Pressor support per primary service Volume: hx of HTN now in shock post-op. Vol up on exam, will aim for net neg 50-100cc/hr w/ CRRT--aim more so for net neg 100cc/hr as BP tolerates  Anemia CKD : s/p PRBC's , Hb 8.9  Metabolic bone disease: resume home binders and sensipar when taking PO. Cont IV vdra.   Pulm HTN:  Followed at [facility], on Revatio and opsumit  T2DM-per primary service  Hx of MV replacement - in 2016  Gean Quint, MD 01/11/2022 8:07 AM

## 2022-01-11 NOTE — Progress Notes (Signed)
° °   °  [address]       [place] [address]             [phone]      Stable day Was too sedated to extubate earlier  BP (!) 113/54    Pulse 95    Temp (!) 96.4 F (35.8 C) (Axillary)    Resp (!) 26    Ht 4' 11.5" (1.511 m)    Wt 76.1 kg    SpO2 97%    BMI 33.32 kg/m   Milrinone 0.125, Epi and Norepi @ 6  K= 4.5 this PM   Intake/Output Summary (Last 24 hours) at 01/11/2022 1745 Last data filed at 01/11/2022 1700 Gross per 24 hour  Intake 1531.29 ml  Output 3490 ml  Net -1958.71 ml    Continues to get volume off with CVVHD Cor track placed so can push TF Hopefully can extubate tomorrow  Remo Lipps C. Roxan Hockey, MD [facility] [phone]

## 2022-01-11 NOTE — Progress Notes (Addendum)
NAME:  Jody Nguyen, MRN:  [id number], DOB:  Apr 12, 1956, LOS: 5 ADMISSION DATE:  01/05/2022, CONSULTATION DATE:  01/10/2022 REFERRING MD:  Martie Lee, TRH, CHIEF COMPLAINT:  SOB   History of Present Illness:  66 yo F PMH ESRD, non-obstructive CAD, suspected pulmonary hypertension, Hx PE on eliquis who presented to [facility] ED 2/4 with chest pain, which radiates to arm. Similar sx had also promoted ED presentation on 2/1 at which time trops were 100. She was seen by cardiology outpt on 2/2 and chest pain was felt to be related to volume shift as pain had occurred during dialysis.   On 2/4 presentation, Pt states that episodic chest pain has been intermittent for the past week. Non-exertional, lasts approx 10 minutes. Associated nausea and SOB. Non-specific EKG changes  & was d/w cardiology, ruled out for STEMI. Trop 200. Placed on [place] for SpO2 80s.Admitted to [facility] for obvs with concern for PE and started on heparin gtt. Awaiting VQ scan. ECHO has been performed which reveals Severe RV RA enlargement, severely reduced RV function and septal bowing, felt consistent with chronic process such as severe pulmonary HTN.    PCCM has been consulted in this setting. She remains critically ill.   Pertinent  Medical History  ESRD PE Pulmonary HTN  CAD CVA HLD HTN S/p MV repair  Pericardial effusion r/t minoxidil  Diastolic HF DM2  Significant Hospital Events: Including procedures, antibiotic start and stop dates in addition to other pertinent events   2/4 ED presentation with recurring chest pain -- STEMI ruled out. Stable, mildly hypoxic and placed on [place] 2/5 admit to [facility] for obvs. Concern for PE, ECHO performed by cardiology reportedly shows RV failure, but felt more consistent with chronic process like severe pulmHTN. Awaiting VQ scan. Pulm consulted  2/7 cardiac cath. 3 vessel dz. CABG rec 2/10 CABG, TVR 2/11 continued vasoplegic shock with very high vasopressor requirements  2/12 Given methylene blue  x 2 2/13 chest tube removed 2/14 JP removed  Interim History / Subjective:  Tmax 97.6  Levo 6 Vaso 0.02 Epi 6 0.3 dex 0.125 milrinone  -2L 450 out NGT, x1 stool, -2L past 24 hours, -7L admission  Unable to obtain subjective evaluation due to patient status Objective   Blood pressure (!) 113/54, pulse (!) 110, temperature (!) 96.4 F (35.8 C), temperature source Axillary, resp. rate (!) 27, height 4' 11.5" (1.511 m), weight 76.1 kg, SpO2 100 %. CVP:  [10 mmHg-36 mmHg] 20 mmHg  Vent Mode: SIMV;PRVC;PSV FiO2 (%):  [40 %] 40 % Set Rate:  [26 bmp] 26 bmp Vt Set:  [440 mL] 440 mL PEEP:  [5 cmH20] 5 cmH20 Pressure Support:  [10 cmH20-15 cmH20] 10 cmH20 Plateau Pressure:  [18 cmH20-24 cmH20] 18 cmH20   Intake/Output Summary (Last 24 hours) at 01/11/2022 0800 Last data filed at 01/11/2022 0700 Gross per 24 hour  Intake 1366.55 ml  Output 3121 ml  Net -1754.45 ml   Filed Weights   01/07/22 0600 01/08/22 0600 01/11/22 0307  Weight: 83.2 kg 81.2 kg 76.1 kg    Examination: General: In bed, NAD, appears comfortable HEENT: MM pink/moist, anicteric, atraumatic Neuro: RASS -1, PERRL 45mm, GCS 11t CV: S1S2, ST, RR, art line with crisp wavform PULM:  air movement in all lobes, trachea midline, chest expansion symmetric GI: soft, bsx4 active, non-tender, flexiseal in place   Extremities: warm/dry, generalized edema, capillary refill less than 3 seconds  Skin: post op dressings c/d/i no rashes or lesions noted  Ancillary tests personally reviewed:   BG 95-126 PLT 52>49 HGB 9.1>8.9 WBC 17.1>18.3>23 COOX 61.6>62.4 Creat 1.71>1,61 Ast 47>50 Bili 1.0>1.4 CXR: stable ETT, no pneumo or effusion  Assessment & Plan:  Coronary artery disease: 3 vessel disease diagnosed on cardiac cath 2/7.  CABG x3 on 2/10 (POD 4) LIMA to LAD, SVG to PD, SVG to DIAG S/p CABG 2/10. Tricuspid regurgitation, severe: S/P TVR (POD 4) tricuspid annuloplasty ring in place HX S/p MVR 2016 Acute on  chronic systolic/diastolic CHF Cardiogenic shock: in the post cardiac surgery setting.  CI 2.3, Cvp 19, -2L past 24 hours. pressors continuing to wean -TCTS following appreciate assistance and management -Continue milrinone at 0.125 -Goal MAP 60-65. Wean levo, epi, vaso to goal map -Continue CVVHD as below -Will start to wean steroids. 100 BID > 50 BID  Acute respiratory failure with hypoxia  Pulmonary HTN  -LTVV strategy with tidal volumes of 4-8 cc/kg ideal body weight -Goal plateau pressures less than 30 and driving pressures less than 15 -Wean PEEP/FiO2 for SpO2 92-98% -VAP bundle -On SBT. Hoping to extubate today. -PAD bundle with Precedex gtt and fentanyl push -RASS goal 0 to -1 -Follow intermittent CXR and ABG PRN -Continue milrinone. Will resume home pulm HTN medications once extubated. Was on revatio and opsumit. Will reassess after extubation.   ESRD on HD Infiltrated AVF Neph suspects clotted AVF -Neph following. Appreciate assistance -Will address AVF once no longer criticallt ill -Continue CRRT. Goal negative 50-173ml/hr -AM BMP  HLD DM2 -Blood Glucose goal 140-180. -SSI -Decreased levemir to 15 u  Acute blood loss anemia HGB 9.1>9.1>8.9. Suspect secondary to surgery and chronic kidney disease -Transfuse PRBC if HBG less than 7 -Obtain AM CBC to trend H&H -Monitor for signs of bleeding  Leukocytosis WBC 17.1>18.3>23. Suspect secondary to steroids, shock. Pressor requirement decreasing. Tmax 97.6. -monitor on AM CBC -Follow fever/wbc curve  Hx PE  Thrombocytopenia - HITT vs CPB related. PLT 123>52>52>48. HITT panel 0.075. SRA pending -Continue holding heparin. Will Dr. Lynetta Mare will discuss starting argatroban with Dr. Roxan Hockey   -Follow up [place] -Was on Ac for PE, will need ac at some point   Best Practice (right click and "Reselect all SmartList Selections" daily)   Diet/type: tubefeeds   DVT prophylaxis: SCD- on hold due to thrombocytopenia.   GI prophylaxis: PPI Lines: Central line, Dialysis Catheter, Arterial Line, and yes and it is still needed Foley:  Yes, and it is still needed Code Status:  full code Last date of multidisciplinary goals of care discussion [Family updated 2/14]   Critical care time: 65 minutes  [employer]mond [employer]., MSN, APRN, AGACNP-BC  Pulmonary & Critical Care  01/11/2022 , 8:00 AM  Please see [email or web address] for pager details  If no response, please call [phone] After hours, please call Elink at [phone]

## 2022-01-11 NOTE — Progress Notes (Signed)
5 Days Post-Op Procedure(s) (LRB): CORONARY ARTERY BYPASS GRAFTING (CABG) TIMES THREE ON CARDIOPULMONARY BYPASS. LIMA TO LAD, SVG TO PD, SVG TO DIAG (N/A) TRICUSPID VALVE REPAIR USING AN 36MM EDWARDS MC3 TRICUSPID ANNULOPLASTY RING (N/A) TRANSESOPHAGEAL ECHOCARDIOGRAM (TEE) (N/A) APPLICATION OF CELL SAVER (N/A) ENDOVEIN HARVEST OF GREATER SAPHENOUS VEIN (Right) REPAIR OF PATENT FORAMEN OVALE (N/A) Subjective: Intubated, responsive  Objective: Vital signs in last 24 hours: Temp:  [94.3 F (34.6 C)-97.6 F (36.4 C)] 96.4 F (35.8 C) (02/15 0748) Pulse Rate:  [94-144] 110 (02/15 0700) Cardiac Rhythm: Normal sinus rhythm (02/14 1931) Resp:  [14-31] 27 (02/15 0700) SpO2:  [84 %-100 %] 100 % (02/15 0700) Arterial Line BP: (72-133)/(40-72) 109/61 (02/15 0700) FiO2 (%):  [40 %] 40 % (02/15 0340) Weight:  [76.1 kg] 76.1 kg (02/15 0307)  Hemodynamic parameters for last 24 hours: CVP:  [10 mmHg-36 mmHg] 20 mmHg  Intake/Output from previous day: 02/14 0701 - 02/15 0700 In: 1420 [I.V.:1090; NG/GT:330] Out: 3431 [Emesis/NG output:450] Intake/Output this shift: Total I/O In: 39 [I.V.:39] Out: 157 [Other:157]  General appearance: cooperative and no distress Neurologic: intact Heart: tachy, regular Lungs: clear to auscultation bilaterally Abdomen: normal findings: soft, non-tender  Lab Results: Recent Labs    01/10/22 0623 01/11/22 0418  WBC 18.3* 23.0*  HGB 9.1* 8.9*  HCT 28.6* 27.3*  PLT 52* 49*   BMET:  Recent Labs    01/10/22 1509 01/11/22 0418  NA 139 137  K 4.4 4.7  CL 106 104  CO2 23 21*  GLUCOSE 107* 126*  BUN 11 12  CREATININE 1.71* 1.61*  CALCIUM 9.0 9.0    PT/INR:  Recent Labs    01/11/22 0418  LABPROT 18.0*  INR 1.5*   ABG    Component Value Date/Time   PHART 7.237 (L) 01/07/2022 0648   HCO3 19.7 (L) 01/07/2022 0648   TCO2 21 (L) 01/07/2022 0648   ACIDBASEDEF 8.0 (H) 01/07/2022 0648   O2SAT 62.4 01/11/2022 0525   CBG (last 3)  Recent Labs     01/11/22 0006 01/11/22 0417 01/11/22 0745  GLUCAP 109* 126* 126*    Assessment/Plan: S/P Procedure(s) (LRB): CORONARY ARTERY BYPASS GRAFTING (CABG) TIMES THREE ON CARDIOPULMONARY BYPASS. LIMA TO LAD, SVG TO PD, SVG TO DIAG (N/A) TRICUSPID VALVE REPAIR USING AN 36MM EDWARDS MC3 TRICUSPID ANNULOPLASTY RING (N/A) TRANSESOPHAGEAL ECHOCARDIOGRAM (TEE) (N/A) APPLICATION OF CELL SAVER (N/A) ENDOVEIN HARVEST OF GREATER SAPHENOUS VEIN (Right) REPAIR OF PATENT FORAMEN OVALE (N/A) -Overall looks good, tremendous progress in last 48 hours NEURO- no focal deficit CV- Sinus tach, co-ox 62  Milrinone 0.125, epi 6, norepi 5, vasopressin 0.01  Continue weaning pressors RESP- VDRF- plan to try extubation today- d/w Dr. Lynetta Mare RENAL- on CVVHD  2L negative yesterday ENDO- CBG mildly elevated GI- TF on hold after emesis  Will hold off until extubated then try PO Anemia stable Thrombocytopenia- stable, off heparin  HIT pending  Issues with clotting HD circuit- will start bivalrudin ID- WBC up to 23 K, no fever but CVVHD could be masking  Most likely secondary to IV steroids  LOS: 10 days    [name] 01/11/2022

## 2022-01-12 ENCOUNTER — Inpatient Hospital Stay (HOSPITAL_COMMUNITY): Payer: Medicare Other

## 2022-01-12 DIAGNOSIS — R579 Shock, unspecified: Secondary | ICD-10-CM | POA: Diagnosis not present

## 2022-01-12 DIAGNOSIS — J95821 Acute postprocedural respiratory failure: Secondary | ICD-10-CM

## 2022-01-12 DIAGNOSIS — N186 End stage renal disease: Secondary | ICD-10-CM | POA: Diagnosis not present

## 2022-01-12 DIAGNOSIS — J9601 Acute respiratory failure with hypoxia: Secondary | ICD-10-CM | POA: Diagnosis not present

## 2022-01-12 DIAGNOSIS — A419 Sepsis, unspecified organism: Secondary | ICD-10-CM | POA: Insufficient documentation

## 2022-01-12 LAB — POCT I-STAT 7, (LYTES, BLD GAS, ICA,H+H)
Acid-Base Excess: 2 mmol/L (ref 0.0–2.0)
Bicarbonate: 23.9 mmol/L (ref 20.0–28.0)
Calcium, Ion: 1.23 mmol/L (ref 1.15–1.40)
HCT: 29 % — ABNORMAL LOW (ref 36.0–46.0)
Hemoglobin: 9.9 g/dL — ABNORMAL LOW (ref 12.0–15.0)
O2 Saturation: 99 %
Patient temperature: 98.3
Potassium: 4.4 mmol/L (ref 3.5–5.1)
Sodium: 138 mmol/L (ref 135–145)
TCO2: 25 mmol/L (ref 22–32)
pCO2 arterial: 27.9 mmHg — ABNORMAL LOW (ref 32–48)
pH, Arterial: 7.541 — ABNORMAL HIGH (ref 7.35–7.45)
pO2, Arterial: 111 mmHg — ABNORMAL HIGH (ref 83–108)

## 2022-01-12 LAB — COOXEMETRY PANEL
Carboxyhemoglobin: 2.4 % — ABNORMAL HIGH (ref 0.5–1.5)
Methemoglobin: 1.2 % (ref 0.0–1.5)
O2 Saturation: 78.6 %
Total hemoglobin: 9.1 g/dL — ABNORMAL LOW (ref 12.0–16.0)

## 2022-01-12 LAB — RENAL FUNCTION PANEL
Albumin: 3.2 g/dL — ABNORMAL LOW (ref 3.5–5.0)
Albumin: 3 g/dL — ABNORMAL LOW (ref 3.5–5.0)
Anion gap: 9 (ref 5–15)
Anion gap: 9 (ref 5–15)
BUN: 12 mg/dL (ref 8–23)
BUN: 13 mg/dL (ref 8–23)
CO2: 23 mmol/L (ref 22–32)
CO2: 24 mmol/L (ref 22–32)
Calcium: 9 mg/dL (ref 8.9–10.3)
Calcium: 9 mg/dL (ref 8.9–10.3)
Chloride: 103 mmol/L (ref 98–111)
Chloride: 104 mmol/L (ref 98–111)
Creatinine, Ser: 1.23 mg/dL — ABNORMAL HIGH (ref 0.44–1.00)
Creatinine, Ser: 1.32 mg/dL — ABNORMAL HIGH (ref 0.44–1.00)
GFR, Estimated: 48 mL/min — ABNORMAL LOW (ref >60)
GFR, Estimated: 45 mL/min — ABNORMAL LOW (ref >60)
Glucose, Bld: 142 mg/dL — ABNORMAL HIGH (ref 70–99)
Glucose, Bld: 178 mg/dL — ABNORMAL HIGH (ref 70–99)
Phosphorus: 2.3 mg/dL — ABNORMAL LOW (ref 2.5–4.6)
Phosphorus: 2.1 mg/dL — ABNORMAL LOW (ref 2.5–4.6)
Potassium: 4.5 mmol/L (ref 3.5–5.1)
Potassium: 4.5 mmol/L (ref 3.5–5.1)
Sodium: 135 mmol/L (ref 135–145)
Sodium: 137 mmol/L (ref 135–145)

## 2022-01-12 LAB — MAGNESIUM
Magnesium: 2.6 mg/dL — ABNORMAL HIGH (ref 1.7–2.4)
Magnesium: 2.5 mg/dL — ABNORMAL HIGH (ref 1.7–2.4)

## 2022-01-12 LAB — SEROTONIN RELEASE ASSAY (SRA)
SRA .2 IU/mL UFH Ser-aCnc: <1 % (ref 0–20)
SRA 100IU/mL UFH Ser-aCnc: <1 % (ref 0–20)

## 2022-01-12 LAB — GLUCOSE, CAPILLARY
Glucose-Capillary: 136 mg/dL — ABNORMAL HIGH (ref 70–99)
Glucose-Capillary: 185 mg/dL — ABNORMAL HIGH (ref 70–99)
Glucose-Capillary: 178 mg/dL — ABNORMAL HIGH (ref 70–99)
Glucose-Capillary: 183 mg/dL — ABNORMAL HIGH (ref 70–99)
Glucose-Capillary: 126 mg/dL — ABNORMAL HIGH (ref 70–99)

## 2022-01-12 LAB — CBC WITH DIFFERENTIAL/PLATELET
Abs Immature Granulocytes: 1.48 10*3/uL — ABNORMAL HIGH (ref 0.00–0.07)
Basophils Absolute: 0.1 10*3/uL (ref 0.0–0.1)
Basophils Relative: 1 %
Eosinophils Absolute: 0.1 10*3/uL (ref 0.0–0.5)
Eosinophils Relative: 0 %
HCT: 28 % — ABNORMAL LOW (ref 36.0–46.0)
Hemoglobin: 8.6 g/dL — ABNORMAL LOW (ref 12.0–15.0)
Immature Granulocytes: 7 %
Lymphocytes Relative: 7 %
Lymphs Abs: 1.6 10*3/uL (ref 0.7–4.0)
MCH: 28.3 pg (ref 26.0–34.0)
MCHC: 30.7 g/dL (ref 30.0–36.0)
MCV: 92.1 fL (ref 80.0–100.0)
Monocytes Absolute: 1.7 10*3/uL — ABNORMAL HIGH (ref 0.1–1.0)
Monocytes Relative: 8 %
Neutro Abs: 17.5 10*3/uL — ABNORMAL HIGH (ref 1.7–7.7)
Neutrophils Relative %: 77 %
Platelets: 59 10*3/uL — ABNORMAL LOW (ref 150–400)
RBC: 3.04 MIL/uL — ABNORMAL LOW (ref 3.87–5.11)
RDW: 20.3 % — ABNORMAL HIGH (ref 11.5–15.5)
WBC: 22.5 10*3/uL — ABNORMAL HIGH (ref 4.0–10.5)
nRBC: 50.9 % — ABNORMAL HIGH (ref 0.0–0.2)

## 2022-01-12 LAB — APTT
aPTT: 79 seconds — ABNORMAL HIGH (ref 24–36)
aPTT: 77 seconds — ABNORMAL HIGH (ref 24–36)

## 2022-01-12 LAB — PATHOLOGIST SMEAR REVIEW

## 2022-01-12 MED ORDER — DEXMEDETOMIDINE HCL IN NACL 400 MCG/100ML IV SOLN
0.4000 ug/kg/h | INTRAVENOUS | Status: DC
  Administered 2022-01-12 – 2022-01-13 (×2): 0.4 ug/kg/h via INTRAVENOUS
  Filled 2022-01-12 (×3): qty 100

## 2022-01-12 NOTE — Progress Notes (Signed)
[place] for bivalirudin Indication: history of pulmonary embolus 2021  Allergies  Allergen Reactions   Minoxidil Other (See Comments)    Pericardial effusion   Lipitor [Atorvastatin] Other (See Comments)    MYALGIAS > "pain in legs" Tolerates rosuvastatin   Morphine And Related Itching   Ace Inhibitors Other (See Comments)    REACTION: "not sure...think it made me drowsy all the time"    Patient Measurements: Height: 4' 11.5" (151.1 cm) Weight: 73.2 kg (161 lb 6 oz) IBW/kg (Calculated) : 44.35  Vital Signs: Temp: 97.9 F (36.6 C) (02/16 1030) Temp Source: Esophageal (02/16 0700) Pulse Rate: 110 (02/16 1030)  Labs: Recent Labs    01/10/22 0623 01/10/22 1509 01/11/22 0418 01/11/22 1609 01/11/22 1945 01/12/22 0211 01/12/22 1000  HGB 9.1*  --  8.9*  --   --  8.6*  --   HCT 28.6*  --  27.3*  --   --  28.0*  --   PLT 52*  --  49*  --   --  59*  --   APTT 40*  --   --  69* 82* 79* 77*  LABPROT 17.8*  --  18.0*  --   --   --   --   INR 1.5*  --  1.5*  --   --   --   --   CREATININE 1.82*   < > 1.61* 1.42*  --  1.23*  --    < > = values in this interval not displayed.     Estimated Creatinine Clearance: 39.7 mL/min (A) (by C-G formula based on SCr of 1.23 mg/dL (H)).   Assessment: 66 yo female on chronic apixaban for hx of PE 2021  Apixaban has been held for surgery.  Pharmacy asked to resume anticoagulation 2/15 with bivalirudin.  Platelet count low 50s, but HIT antibody negative.  Bivalirudin drip rate 0.03mg /kg/hr aptt 77sec  at upper end of planned low goal with low pltc Hgb low but stable 8-9, Pltc low stable 50s Pacing wires pulled 2/15 Currently on CRRT   Goal of Therapy:  aPTT goal 50-70sec - target lower end given low platelet count/risk of post-operative bleeding Monitor platelets by anticoagulation protocol: Yes   Plan:  Decrease  IV bivalirudin at 0.02 mg/kg/hr  Daily aptt and CBC   Bonnita Nasuti Pharm.D.  CPP, BCPS Clinical Pharmacist [phone] 01/12/2022 11:41 AM

## 2022-01-12 NOTE — Progress Notes (Signed)
ANTICOAGULATION CONSULT NOTE - Follow Up Consult  Pharmacy Consult for bivalirudin Indication:  h/o PE  Labs: Recent Labs    01/09/22 0509 01/09/22 1446 01/10/22 0623 01/10/22 1509 01/11/22 0418 01/11/22 1609 01/11/22 1945 01/12/22 0211  HGB 9.1*  --  9.1*  --  8.9*  --   --  8.6*  HCT 28.3*  --  28.6*  --  27.3*  --   --  28.0*  PLT 52*  --  52*  --  49*  --   --  59*  APTT 41*  --  40*  --   --  69* 82* 79*  LABPROT 20.1*  --  17.8*  --  18.0*  --   --   --   INR 1.7*  --  1.5*  --  1.5*  --   --   --   CREATININE 2.15*   < > 1.82* 1.71* 1.61* 1.42*  --   --    < > = values in this interval not displayed.    Assessment: 66yo female remains suptatherapeutic on bivalirudin after rate change; no infusion issues or signs of bleeding per RN.  Goal of Therapy:  aPTT 50-70 seconds   Plan:  Will decrease bivalirudin infusion by 20% to 0.03 mg/kg//hr and check PTT in 6 hours.    Wynona Neat, PharmD, BCPS  01/12/2022,3:46 AM

## 2022-01-12 NOTE — Progress Notes (Signed)
NAME:  Jody Nguyen, MRN:  [id number], DOB:  03/24/1956, LOS: 56 ADMISSION DATE:  01/02/2022, CONSULTATION DATE:  01/01/2022 REFERRING MD:  Martie Lee, TRH, CHIEF COMPLAINT:  SOB   History of Present Illness:  66 yo F PMH ESRD, non-obstructive CAD, suspected pulmonary hypertension, Hx PE on eliquis who presented to [facility] ED 2/4 with chest pain, which radiates to arm. Similar sx had also promoted ED presentation on 2/1 at which time trops were 100. She was seen by cardiology outpt on 2/2 and chest pain was felt to be related to volume shift as pain had occurred during dialysis.   On 2/4 presentation, Pt states that episodic chest pain has been intermittent for the past week. Non-exertional, lasts approx 10 minutes. Associated nausea and SOB. Non-specific EKG changes  & was d/w cardiology, ruled out for STEMI. Trop 200. Placed on [place] for SpO2 80s.Admitted to [facility] for obvs with concern for PE and started on heparin gtt. Awaiting VQ scan. ECHO has been performed which reveals Severe RV RA enlargement, severely reduced RV function and septal bowing, felt consistent with chronic process such as severe pulmonary HTN.    PCCM has been consulted in this setting. She remains critically ill.   Pertinent  Medical History  ESRD PE Pulmonary HTN  CAD CVA HLD HTN S/p MV repair  Pericardial effusion r/t minoxidil  Diastolic HF DM2  Significant Hospital Events: Including procedures, antibiotic start and stop dates in addition to other pertinent events   2/4 ED presentation with recurring chest pain -- STEMI ruled out. Stable, mildly hypoxic and placed on [place] 2/5 admit to [facility] for obvs. Concern for PE, ECHO performed by cardiology reportedly shows RV failure, but felt more consistent with chronic process like severe pulmHTN. Awaiting VQ scan. Pulm consulted  2/7 cardiac cath. 3 vessel dz. CABG rec 2/10 CABG, TVR 2/11 continued vasoplegic shock with very high vasopressor requirements  2/12 Given methylene blue  x 2 2/13 chest tube removed 2/14 JP removed 2/15 pacing wires removed 2/16 milrinone stopped  Interim History / Subjective:  Tmax 98.1  -9.7L admission, -2.4L past 24 hours  Milrinone off Levo up to 14 Epi 6 Dex off  Unable to obtain subjective evaluation due to patient status  Objective   Blood pressure (!) 113/54, pulse (!) 111, temperature (!) 97.5 F (36.4 C), resp. rate 19, height 4' 11.5" (1.511 m), weight 73.2 kg, SpO2 100 %. CVP:  [12 mmHg-28 mmHg] 16 mmHg  Vent Mode: SIMV;PRVC;PSV FiO2 (%):  [40 %] 40 % Set Rate:  [26 bmp] 26 bmp Vt Set:  [440 mL] 440 mL PEEP:  [5 cmH20] 5 cmH20 Pressure Support:  [5 cmH20-10 cmH20] 10 cmH20 Plateau Pressure:  [20 cmH20] 20 cmH20   Intake/Output Summary (Last 24 hours) at 01/12/2022 0840 Last data filed at 01/12/2022 6734 Gross per 24 hour  Intake 2035.87 ml  Output 4313 ml  Net -2277.13 ml    Filed Weights   01/08/22 0600 01/11/22 0307 01/12/22 0500  Weight: 81.2 kg 76.1 kg 73.2 kg    Examination: General: In bed, NAD, appears comfortable HEENT: MM pink/moist, anicteric, atraumatic Neuro: RASS -1, PERRL 56mm, GCS 11t, MAE, FC with toes and tongue CV: S1S2, ST, arterial line with crisp waveform PULM:  air movement in all lobes, trachea midline, chest expansion symmetric GI: soft, bsx4 active, non-tender   Extremities: warm/dry, no pretibial edema, capillary refill less than 3 seconds. Right fistula without bruit.  Skin: Post surgical sites c/d/I,  no  rashes or lesions noted  Ancillary tests personally reviewed:   MG 2.6 WBC 23>22.5 HGB 8.9>8.6 Plt 52>49>59 COOX 78 Creat 1/42>1/23 Smear: in process SRA pending CXR: stable tubes, no pneumo or large effusion noted.  Assessment & Plan:  Coronary artery disease: 3 vessel disease diagnosed on cardiac cath 2/7.  CABG x3 on 2/10 (POD 6) LIMA to LAD, SVG to PD, SVG to DIAG S/p CABG 2/10. Tricuspid regurgitation, severe: S/P TVR (POD 4) tricuspid annuloplasty ring in  place HX S/p MVR 2016 Acute on chronic systolic/diastolic CHF Cardiogenic shock: in the post cardiac surgery setting, improving  CI 2.3, CVP 16-17, -9.7L admission, -2.4L past 24 hours. Milrinone stopped due to PVCs. Levophed increased, off vaso, epi continues. -TCTS following appreciate assistance and management -Goal MAP 60-65. Continue epi, wean levo first to goal map -Continue CVVHD as below -Continue Solucortef 50 BID, continue wean on 2/17  Acute respiratory failure with hypoxia  Pulmonary HTN  Unable to extubate on 2/15 secondary to medication/sedation from fentanyl for pacemaker wire removal. -LTVV strategy with tidal volumes of 4-8 cc/kg ideal body weight -Goal plateau pressures less than 30 and driving pressures less than 15 -Wean PEEP/FiO2 for SpO2 92-98% -VAP bundle -Weaning currently. Hope to extubate today. -PAD bundle with Precedex gtt and fentanyl push -RASS goal 0 to -1 -Follow intermittent CXR and ABG PRN -Was on revatio and opsumit. Will reassess after extubation. Holding on resuming Pulm HTN medications until after extubation and swallow.  ESRD on HD Infiltrated AVF Neph suspects clotted AVF -Neph following appreciate assistance  -Will address AVF once no longer critically ill. Dr. Lynetta Mare plans to consolidate lines once extubated. Neph to have VVS to see fistula. -Continue CRRT. Goal net even today  HLD DM2 -Blood Glucose goal 140-180. -SSI -Continue levemir to 15 u  Acute blood loss anemia HGB 9.1>9.1>8.9>8.6. Suspect secondary to surgery, frequent CRRT circuit changes, and chronic kidney disease -Transfuse PRBC if HBG less than 7 -Obtain AM CBC to trend H&H -Monitor for signs of bleeding  Leukocytosis WBC 17.1>18.3>23>22.5. Suspect secondary to steroids, shock. Afebrile. Weaning steroids -Monitor on AM CBC -Follow fever/WBC curve  Hx PE  Thrombocytopenia - HITT vs CPB related. PLT 123>52>52>48>59. HITT panel 0.075. SRA pending -Continue  agatroban -Follow up on SRA -Was on [facility] previously for PE   Best Practice (right click and "Reselect all SmartList Selections" daily)   Diet/type: tubefeeds   DVT prophylaxis: other- on hold due to thrombocytopenia.  GI prophylaxis: PPI Lines: Central line, Dialysis Catheter, Arterial Line, and yes and it is still needed Foley:  N/A Code Status:  full code Last date of multidisciplinary goals of care discussion [Family updated 2/14]   Critical care time: 15 minutes  Redmond School., MSN, APRN, AGACNP-BC [facility]  01/12/2022 , 8:40 AM  Please see [email or web address] for pager details  If no response, please call [phone] After hours, please call Elink at [phone]

## 2022-01-12 NOTE — TOC Progression Note (Signed)
Transition of Care [facility]) - Progression Note    Patient Details  Name: Jody Nguyen MRN: [id number] Date of Birth: 1956-07-16  [facility] CM/SW Contact  Graves-Bigelow, Ocie Cornfield, RN Phone Number: 01/12/2022, 4:37 PM  Clinical Narrative:  Case Manager continues to follow for disposition needs. Patient continues on CRRT and has Cortrak. Patients spouse at the bedside. Case Manager will continue to follow for additional transition of care needs.   Expected Discharge Plan: [place] Barriers to Discharge: Continued Medical Work up  Expected Discharge Plan and Services Expected Discharge Plan: [place] In-house Referral: NA Discharge Planning Services: CM Consult Post Acute Care Choice: [place] arrangements for the past 2 months: Single Family Home                   DME Agency: NA      Readmission Risk Interventions No flowsheet data found.

## 2022-01-12 NOTE — Procedures (Signed)
Extubation Procedure Note  Patient Details:   Name: ZLATA ALCAIDE DOB: [birth year] MRN: [id number]   Airway Documentation:    Vent end date: (not recorded) Vent end time: (not recorded)   Evaluation  O2 sats: stable throughout Complications: No apparent complications Patient did tolerate procedure well. Bilateral Breath Sounds: Clear, Diminished   Yes  Esperanza Sheets T 01/12/2022, 12:01 PM

## 2022-01-12 NOTE — Progress Notes (Signed)
6 Days Post-Op Procedure(s) (LRB): CORONARY ARTERY BYPASS GRAFTING (CABG) TIMES THREE ON CARDIOPULMONARY BYPASS. LIMA TO LAD, SVG TO PD, SVG TO DIAG (N/A) TRICUSPID VALVE REPAIR USING AN 36MM EDWARDS MC3 TRICUSPID ANNULOPLASTY RING (N/A) TRANSESOPHAGEAL ECHOCARDIOGRAM (TEE) (N/A) APPLICATION OF CELL SAVER (N/A) ENDOVEIN HARVEST OF GREATER SAPHENOUS VEIN (Right) REPAIR OF PATENT FORAMEN OVALE (N/A) Subjective: Intubated, but responsive  Objective: Vital signs in last 24 hours: Temp:  [92.1 F (33.4 C)-98.1 F (36.7 C)] 97.2 F (36.2 C) (02/16 0700) Pulse Rate:  [92-112] 112 (02/16 0700) Cardiac Rhythm: Sinus tachycardia (02/16 0730) Resp:  [0-26] 21 (02/16 0700) SpO2:  [91 %-100 %] 100 % (02/16 0700) Arterial Line BP: (89-119)/(49-73) 105/57 (02/16 0700) FiO2 (%):  [40 %] 40 % (02/16 0600) Weight:  [73.2 kg] 73.2 kg (02/16 0500)  Hemodynamic parameters for last 24 hours: CVP:  [12 mmHg-28 mmHg] 17 mmHg  Intake/Output from previous day: 02/15 0701 - 02/16 0700 In: 1882.1 [I.V.:1002.6; NG/GT:879.5] Out: 4323 [Emesis/NG output:250] Intake/Output this shift: No intake/output data recorded.  General appearance: cooperative Neurologic: intact Heart: regular rate and rhythm and occasional ectopy Lungs: clear to auscultation bilaterally Extremities: edema decreased Wound: intact  Lab Results: Recent Labs    01/11/22 0418 01/12/22 0211  WBC 23.0* 22.5*  HGB 8.9* 8.6*  HCT 27.3* 28.0*  PLT 49* 59*   BMET:  Recent Labs    01/11/22 1609 01/12/22 0211  NA 137 135  K 4.5 4.5  CL 103 103  CO2 24 23  GLUCOSE 87 142*  BUN 10 12  CREATININE 1.42* 1.23*  CALCIUM 9.2 9.0    PT/INR:  Recent Labs    01/11/22 0418  LABPROT 18.0*  INR 1.5*   ABG    Component Value Date/Time   PHART 7.237 (L) 01/07/2022 0648   HCO3 19.7 (L) 01/07/2022 0648   TCO2 21 (L) 01/07/2022 0648   ACIDBASEDEF 8.0 (H) 01/07/2022 0648   O2SAT 78.6 01/12/2022 0211   CBG (last 3)  Recent  Labs    01/11/22 2311 01/12/22 0318 01/12/22 0750  GLUCAP 130* 136* 185*    Assessment/Plan: S/P Procedure(s) (LRB): CORONARY ARTERY BYPASS GRAFTING (CABG) TIMES THREE ON CARDIOPULMONARY BYPASS. LIMA TO LAD, SVG TO PD, SVG TO DIAG (N/A) TRICUSPID VALVE REPAIR USING AN 36MM EDWARDS MC3 TRICUSPID ANNULOPLASTY RING (N/A) TRANSESOPHAGEAL ECHOCARDIOGRAM (TEE) (N/A) APPLICATION OF CELL SAVER (N/A) ENDOVEIN HARVEST OF GREATER SAPHENOUS VEIN (Right) REPAIR OF PATENT FORAMEN OVALE (N/A) - NEURO- intact CV- co-ox 78- drawn form groin line so likely artificially high  Milrinone 0.125, epi 6, norepi up to 13  CVP artificially high in setting of TR  Likely a little dry intravascularly accounting for increased norepi RESP_ VDRF  Lungs clear on CXR  Not extubated yesterday due to sedation  D/w Dr. Lynetta Mare- will try again today to extubate RENAL- now 9L negative'  Will change to net even for now to let her reequilibrate ENDO- CBG mildly elevated with TF GI- Cor track in place, tolerating feeds so far Thrombocytopenia- PLT up slightly WBC elevated, no change from yesterday   LOS: 11 days    [name] 01/12/2022

## 2022-01-12 NOTE — Significant Event (Signed)
Husband updated at bedside  [employer] MSN, APRN, AGACNP-BC  Pulmonary & Critical Care  01/12/2022 , 1:15 PM  Please see [email or web address] for pager details  If no response, please call [phone] After hours, please call Elink at [phone]

## 2022-01-12 NOTE — Progress Notes (Addendum)
Progress Note    01/12/2022 12:22 PM 6 Days Post-Op  Subjective:  she was seen in consultation on 2/9 by Dr. Carlis Abbott for evaluation of infiltration of right forearm fistula. This has been functioning since 2018. The fistula was infiltrated on 2/7 during treatment but was able to be used again on 2/9. Patient has been on CRRT since following CABG. Fistula has had good thrill on exam until today. No thrill or bruit noted to be present in fistula. Currently being dialyzed via left femoral temporary HD cath  Vitals:   01/12/22 1000 01/12/22 1030  BP:    Pulse: (!) 113 (!) 110  Resp: (!) 24 (!) 24  Temp: 98.1 F (36.7 C) 97.9 F (36.6 C)  SpO2: 100% 100%   Physical Exam: Cardiac:  regular Lungs:  non labored Extremities:  right radiocephalic AV fistula present. No thrill. Forearm and hand very edematous. Hematoma present along fistula from infiltration Neurologic: alert  CBC    Component Value Date/Time   WBC 22.5 (H) 01/12/2022 0211   RBC 3.04 (L) 01/12/2022 0211   HGB 8.6 (L) 01/12/2022 0211   HGB 12.1 03/03/2013 0955   HCT 28.0 (L) 01/12/2022 0211   HCT 37.4 03/03/2013 0955   PLT 59 (L) 01/12/2022 0211   PLT 288 03/03/2013 0955   MCV 92.1 01/12/2022 0211   MCV 77.9 (L) 03/03/2013 0955   MCH 28.3 01/12/2022 0211   MCHC 30.7 01/12/2022 0211   RDW 20.3 (H) 01/12/2022 0211   RDW 16.1 (H) 03/03/2013 0955   LYMPHSABS 1.6 01/12/2022 0211   LYMPHSABS 1.8 03/03/2013 0955   MONOABS 1.7 (H) 01/12/2022 0211   MONOABS 0.4 03/03/2013 0955   EOSABS 0.1 01/12/2022 0211   EOSABS 0.3 03/03/2013 0955   BASOSABS 0.1 01/12/2022 0211   BASOSABS 0.0 03/03/2013 0955    BMET    Component Value Date/Time   NA 135 01/12/2022 0211   NA 140 03/03/2013 0955   K 4.5 01/12/2022 0211   K 3.8 03/03/2013 0955   CL 103 01/12/2022 0211   CL 99 03/03/2013 0955   CO2 23 01/12/2022 0211   CO2 29 03/03/2013 0955   GLUCOSE 142 (H) 01/12/2022 0211   GLUCOSE 210 (H) 03/03/2013 0955   BUN 12  01/12/2022 0211   BUN 28.1 (H) 03/03/2013 0955   CREATININE 1.23 (H) 01/12/2022 0211   CREATININE 7.21 (H) 12/08/2021 1110   CREATININE 1.9 (H) 03/03/2013 0955   CALCIUM 9.0 01/12/2022 0211   CALCIUM 9.9 03/03/2013 0955   GFRNONAA 48 (L) 01/12/2022 0211   GFRNONAA 5 (L) 04/21/2021 1614   GFRAA 6 (L) 04/21/2021 1614    INR    Component Value Date/Time   INR 1.5 (H) 01/11/2022 0418     Intake/Output Summary (Last 24 hours) at 01/12/2022 1222 Last data filed at 01/12/2022 1200 Gross per 24 hour  Intake 1869.58 ml  Output 3839 ml  Net -1969.42 ml     Assessment/Plan:  66 y.o. female with ESRD on CCRT now s/p CABG with thrombosed radiocephalic AV fistula. Fistula initially infiltrated but was functioning until CABG on 2/10. Fistula since found to be thrombosed.   Patient remains critically ill. On CRRT. Remains on pressors x3 No thrill in right radiocephalic AVF. Was previously infiltrated, now thrombosed. She will need new access once she is no longer critically ill Can place [facility] when patient is more stable   Karoline Caldwell, [facility] [phone] 01/12/2022 12:22 PM  VASCULAR STAFF ADDENDUM: I  have independently interviewed and examined the patient. I agree with the above.  R RC AVF not functional; will ultimately need new access. Will check in next week to see if more stable for [facility].  Yevonne Aline. Stanford Breed, MD [facility] Phone Number: [phone] 01/12/2022 2:35 PM

## 2022-01-12 NOTE — Progress Notes (Signed)
[facility] Progress Note  Name: NEGAR SIELER MRN: [id number] DOB: 31-Dec-1955   Subjective:  Patient seen and examined on CRRT. On bivalirudin now. Extubation held yesterday due to patient being too sedated Vaso off Tolerating UF net neg 100cc/hr Net neg ~2.4L  Intake/Output Summary (Last 24 hours) at 01/12/2022 0742 Last data filed at 01/12/2022 0700 Gross per 24 hour  Intake 1882.13 ml  Output 4323 ml  Net -2440.87 ml    Vitals:  Vitals:   01/12/22 0615 01/12/22 0630 01/12/22 0645 01/12/22 0700  BP:      Pulse:  (!) 110 (!) 111 (!) 112  Resp: (!) 26 (!) 26 19 (!) 21  Temp: (!) 97 F (36.1 C) (!) 96.8 F (36 C) (!) 97 F (36.1 C) (!) 97.2 F (36.2 C)  TempSrc:    Esophageal  SpO2:  100% 100% 100%  Weight:      Height:         Physical Exam:  General adult female in bed intubated critically ill  Lungs: cta bl Heart S1S2  Abdomen soft obese Extremities: generalized edema Neuro - awakens to vocal stim, following some commands Access: right arm AVF in place, no bruit or thrill/swollen; left fem temp HD line c/d/i  Medications reviewed   Labs:  BMP Latest Ref Rng & Units 01/12/2022 01/11/2022 01/11/2022  Glucose 70 - 99 mg/dL 142(H) 87 126(H)  BUN 8 - 23 mg/dL 12 10 12   Creatinine 0.44 - 1.00 mg/dL 1.23(H) 1.42(H) 1.61(H)  BUN/Creat Ratio 6 - 22 (calc) - - -  Sodium 135 - 145 mmol/L 135 137 137  Potassium 3.5 - 5.1 mmol/L 4.5 4.5 4.7  Chloride 98 - 111 mmol/L 103 103 104  CO2 22 - 32 mmol/L 23 24 21(L)  Calcium 8.9 - 10.3 mg/dL 9.0 9.2 9.0   Dialysis Orders: MWF at [facility] 3:15hr, 450/A1.5, EDW 75.5kg, 2K/2Ca, UFP #2, AVF, heparin 2500 unit bolus - Hectoral 101mcg IV q HD - Sensipar 90mg  PO q HD - Mircera 15mcg IV q 2 weeks (ordered, not yet given. Last Mircera 100 on 12/07/21)   Assessment/Plan:   CAD s/p CABG x3, TR sp TVR:  per CT surgery   ESRD:  normally MWF schedule, last IHD Thursday 2/09. Remains on pressors x 3. CRRT started 2/11,  remains critically ill. Cont CRRT.  Infiltrated AVF - AVF infiltrated on Tuesday 2/7. Expert cannulator able to stick 2/09 and completed full treatment. Postop no bruit today again, suspect has clotted, swollen. Will consult [facility] to have them take a look.  will also likely need a TDC-postpone this issue to when more stable, Hypotension - down to 2 pressors and milrinone. Pressor support per primary service Leukocytosis-per pmd Thrombycytopenia-per PMD, on angiomax Volume: hx of HTN now in shock post-op. Vol up on exam, will aim for net neg 50-100cc/hr w/ CRRT--aim more so for net neg 100cc/hr as BP tolerates  Anemia CKD : s/p PRBC's , Hb 8.6, transfuse prn  Metabolic bone disease: resume home binders and sensipar when taking PO. Cont IV vdra.   Pulm HTN:  Followed at [facility], on Revatio and opsumit  T2DM-per primary service  Hx of MV replacement - in 2016  Gean Quint, MD 01/12/2022 7:42 AM

## 2022-01-12 NOTE — Progress Notes (Signed)
Patient ID: Jody Nguyen, female   DOB: [birth year], 66 y.o.   MRN: [id number] TCTS Evening Rounds:  Hemodynamics stable on epi 5, NE 7  Extubated today to [place]. ABG looks good. Bipap at hs  I+O today.  BMET    Component Value Date/Time   NA 138 01/12/2022 1822   NA 140 03/03/2013 0955   K 4.4 01/12/2022 1822   K 3.8 03/03/2013 0955   CL 104 01/12/2022 1557   CL 99 03/03/2013 0955   CO2 24 01/12/2022 1557   CO2 29 03/03/2013 0955   GLUCOSE 178 (H) 01/12/2022 1557   GLUCOSE 210 (H) 03/03/2013 0955   BUN 13 01/12/2022 1557   BUN 28.1 (H) 03/03/2013 0955   CREATININE 1.32 (H) 01/12/2022 1557   CREATININE 7.21 (H) 12/08/2021 1110   CREATININE 1.9 (H) 03/03/2013 0955   CALCIUM 9.0 01/12/2022 1557   CALCIUM 9.9 03/03/2013 0955   EGFR 6 (L) 12/08/2021 1110   GFRNONAA 45 (L) 01/12/2022 1557   GFRNONAA 5 (L) 04/21/2021 1614   CBC    Component Value Date/Time   WBC 22.5 (H) 01/12/2022 0211   RBC 3.04 (L) 01/12/2022 0211   HGB 9.9 (L) 01/12/2022 1822   HGB 12.1 03/03/2013 0955   HCT 29.0 (L) 01/12/2022 1822   HCT 37.4 03/03/2013 0955   PLT 59 (L) 01/12/2022 0211   PLT 288 03/03/2013 0955   MCV 92.1 01/12/2022 0211   MCV 77.9 (L) 03/03/2013 0955   MCH 28.3 01/12/2022 0211   MCHC 30.7 01/12/2022 0211   RDW 20.3 (H) 01/12/2022 0211   RDW 16.1 (H) 03/03/2013 0955   LYMPHSABS 1.6 01/12/2022 0211   LYMPHSABS 1.8 03/03/2013 0955   MONOABS 1.7 (H) 01/12/2022 0211   MONOABS 0.4 03/03/2013 0955   EOSABS 0.1 01/12/2022 0211   EOSABS 0.3 03/03/2013 0955   BASOSABS 0.1 01/12/2022 0211   BASOSABS 0.0 03/03/2013 0955

## 2022-01-13 ENCOUNTER — Inpatient Hospital Stay (HOSPITAL_COMMUNITY): Payer: Medicare Other

## 2022-01-13 DIAGNOSIS — R579 Shock, unspecified: Secondary | ICD-10-CM | POA: Diagnosis not present

## 2022-01-13 LAB — POCT I-STAT 7, (LYTES, BLD GAS, ICA,H+H)
Acid-Base Excess: 2 mmol/L (ref 0.0–2.0)
Acid-Base Excess: 1 mmol/L (ref 0.0–2.0)
Bicarbonate: 26.2 mmol/L (ref 20.0–28.0)
Bicarbonate: 24.9 mmol/L (ref 20.0–28.0)
Calcium, Ion: 1.32 mmol/L (ref 1.15–1.40)
Calcium, Ion: 1.3 mmol/L (ref 1.15–1.40)
HCT: 29 % — ABNORMAL LOW (ref 36.0–46.0)
HCT: 28 % — ABNORMAL LOW (ref 36.0–46.0)
Hemoglobin: 9.9 g/dL — ABNORMAL LOW (ref 12.0–15.0)
Hemoglobin: 9.5 g/dL — ABNORMAL LOW (ref 12.0–15.0)
O2 Saturation: 99 %
O2 Saturation: 89 %
Patient temperature: 98.3
Patient temperature: 98
Potassium: 4.2 mmol/L (ref 3.5–5.1)
Potassium: 4.6 mmol/L (ref 3.5–5.1)
Sodium: 138 mmol/L (ref 135–145)
Sodium: 138 mmol/L (ref 135–145)
TCO2: 27 mmol/L (ref 22–32)
TCO2: 26 mmol/L (ref 22–32)
pCO2 arterial: 39 mmHg (ref 32–48)
pCO2 arterial: 34.8 mmHg (ref 32–48)
pH, Arterial: 7.435 (ref 7.35–7.45)
pH, Arterial: 7.461 — ABNORMAL HIGH (ref 7.35–7.45)
pO2, Arterial: 136 mmHg — ABNORMAL HIGH (ref 83–108)
pO2, Arterial: 52 mmHg — ABNORMAL LOW (ref 83–108)

## 2022-01-13 LAB — BASIC METABOLIC PANEL
Anion gap: 7 (ref 5–15)
BUN: 15 mg/dL (ref 8–23)
CO2: 25 mmol/L (ref 22–32)
Calcium: 9.2 mg/dL (ref 8.9–10.3)
Chloride: 103 mmol/L (ref 98–111)
Creatinine, Ser: 1.38 mg/dL — ABNORMAL HIGH (ref 0.44–1.00)
GFR, Estimated: 42 mL/min — ABNORMAL LOW (ref >60)
Glucose, Bld: 186 mg/dL — ABNORMAL HIGH (ref 70–99)
Potassium: 4.7 mmol/L (ref 3.5–5.1)
Sodium: 135 mmol/L (ref 135–145)

## 2022-01-13 LAB — GLUCOSE, CAPILLARY
Glucose-Capillary: 112 mg/dL — ABNORMAL HIGH (ref 70–99)
Glucose-Capillary: 122 mg/dL — ABNORMAL HIGH (ref 70–99)
Glucose-Capillary: 122 mg/dL — ABNORMAL HIGH (ref 70–99)
Glucose-Capillary: 123 mg/dL — ABNORMAL HIGH (ref 70–99)
Glucose-Capillary: 136 mg/dL — ABNORMAL HIGH (ref 70–99)
Glucose-Capillary: 172 mg/dL — ABNORMAL HIGH (ref 70–99)
Glucose-Capillary: 172 mg/dL — ABNORMAL HIGH (ref 70–99)

## 2022-01-13 LAB — CBC WITH DIFFERENTIAL/PLATELET
Abs Immature Granulocytes: 3.02 10*3/uL — ABNORMAL HIGH (ref 0.00–0.07)
Basophils Absolute: 0.1 10*3/uL (ref 0.0–0.1)
Basophils Relative: 1 %
Eosinophils Absolute: 0.1 10*3/uL (ref 0.0–0.5)
Eosinophils Relative: 0 %
HCT: 27.1 % — ABNORMAL LOW (ref 36.0–46.0)
Hemoglobin: 8.7 g/dL — ABNORMAL LOW (ref 12.0–15.0)
Immature Granulocytes: 12 %
Lymphocytes Relative: 8 %
Lymphs Abs: 2 10*3/uL (ref 0.7–4.0)
MCH: 29.6 pg (ref 26.0–34.0)
MCHC: 32.1 g/dL (ref 30.0–36.0)
MCV: 92.2 fL (ref 80.0–100.0)
Monocytes Absolute: 1.4 10*3/uL — ABNORMAL HIGH (ref 0.1–1.0)
Monocytes Relative: 6 %
Neutro Abs: 19.1 10*3/uL — ABNORMAL HIGH (ref 1.7–7.7)
Neutrophils Relative %: 73 %
Platelets: 90 10*3/uL — ABNORMAL LOW (ref 150–400)
RBC: 2.94 MIL/uL — ABNORMAL LOW (ref 3.87–5.11)
RDW: 20.6 % — ABNORMAL HIGH (ref 11.5–15.5)
WBC: 25.8 10*3/uL — ABNORMAL HIGH (ref 4.0–10.5)
nRBC: 50.7 % — ABNORMAL HIGH (ref 0.0–0.2)

## 2022-01-13 LAB — LACTIC ACID, PLASMA: Lactic Acid, Venous: 1.4 mmol/L (ref 0.5–1.9)

## 2022-01-13 LAB — RENAL FUNCTION PANEL
Albumin: 2.9 g/dL — ABNORMAL LOW (ref 3.5–5.0)
Anion gap: 9 (ref 5–15)
BUN: 17 mg/dL (ref 8–23)
CO2: 24 mmol/L (ref 22–32)
Calcium: 9 mg/dL (ref 8.9–10.3)
Chloride: 105 mmol/L (ref 98–111)
Creatinine, Ser: 1.5 mg/dL — ABNORMAL HIGH (ref 0.44–1.00)
GFR, Estimated: 38 mL/min — ABNORMAL LOW (ref >60)
Glucose, Bld: 131 mg/dL — ABNORMAL HIGH (ref 70–99)
Phosphorus: 2 mg/dL — ABNORMAL LOW (ref 2.5–4.6)
Potassium: 4.4 mmol/L (ref 3.5–5.1)
Sodium: 138 mmol/L (ref 135–145)

## 2022-01-13 LAB — MAGNESIUM: Magnesium: 2.8 mg/dL — ABNORMAL HIGH (ref 1.7–2.4)

## 2022-01-13 LAB — COOXEMETRY PANEL
Carboxyhemoglobin: 2.6 % — ABNORMAL HIGH (ref 0.5–1.5)
Carboxyhemoglobin: 2.4 % — ABNORMAL HIGH (ref 0.5–1.5)
Methemoglobin: 0.8 % (ref 0.0–1.5)
Methemoglobin: <0.7 % (ref 0.0–1.5)
O2 Saturation: 100 %
O2 Saturation: 62.3 %
Total hemoglobin: 8.9 g/dL — ABNORMAL LOW (ref 12.0–16.0)
Total hemoglobin: 8.9 g/dL — ABNORMAL LOW (ref 12.0–16.0)

## 2022-01-13 LAB — APTT: aPTT: 72 seconds — ABNORMAL HIGH (ref 24–36)

## 2022-01-13 LAB — MRSA NEXT GEN BY PCR, NASAL: MRSA by PCR Next Gen: NOT DETECTED

## 2022-01-13 LAB — PREPARE RBC (CROSSMATCH)

## 2022-01-13 MED ORDER — MIDAZOLAM HCL 2 MG/2ML IJ SOLN
INTRAMUSCULAR | Status: AC
  Administered 2022-01-13: 1 mg
  Filled 2022-01-13: qty 2

## 2022-01-13 MED ORDER — PIPERACILLIN-TAZOBACTAM 3.375 G IVPB
3.3750 g | Freq: Four times a day (QID) | INTRAVENOUS | Status: DC
  Administered 2022-01-13 – 2022-01-15 (×8): 3.375 g via INTRAVENOUS
  Filled 2022-01-13 (×7): qty 50

## 2022-01-13 MED ORDER — HYDROCORTISONE SOD SUC (PF) 100 MG IJ SOLR
50.0000 mg | Freq: Two times a day (BID) | INTRAMUSCULAR | Status: AC
  Administered 2022-01-13 – 2022-01-14 (×3): 50 mg via INTRAVENOUS
  Filled 2022-01-13 (×3): qty 2

## 2022-01-13 MED ORDER — "THROMBI-PAD 3""X3"" EX PADS"
1.0000 | MEDICATED_PAD | Freq: Once | CUTANEOUS | Status: AC
  Administered 2022-01-13: 1 via TOPICAL
  Filled 2022-01-13: qty 1

## 2022-01-13 MED ORDER — SODIUM CHLORIDE 0.9% IV SOLUTION: Freq: Once | INTRAVENOUS | Status: AC

## 2022-01-13 MED ORDER — VANCOMYCIN HCL 750 MG/150ML IV SOLN
750.0000 mg | INTRAVENOUS | Status: DC | PRN
  Filled 2022-01-13: qty 150

## 2022-01-13 MED ORDER — VANCOMYCIN HCL 750 MG/150ML IV SOLN
750.0000 mg | Freq: Every day | INTRAVENOUS | Status: DC
  Filled 2022-01-13: qty 150

## 2022-01-13 MED ORDER — VASOPRESSIN 20 UNITS/100 ML INFUSION FOR SHOCK
0.0000 [IU]/min | INTRAVENOUS | Status: DC
  Administered 2022-01-13 – 2022-01-17 (×5): 0.03 [IU]/min via INTRAVENOUS
  Administered 2022-01-18 (×2): 0.04 [IU]/min via INTRAVENOUS
  Administered 2022-01-18: 0.03 [IU]/min via INTRAVENOUS
  Administered 2022-01-19 – 2022-01-20 (×6): 0.04 [IU]/min via INTRAVENOUS
  Administered 2022-01-21: 18:00:00 0.03 [IU]/min via INTRAVENOUS
  Administered 2022-01-21: 07:00:00 0.04 [IU]/min via INTRAVENOUS
  Administered 2022-01-22 – 2022-01-24 (×7): 0.03 [IU]/min via INTRAVENOUS
  Administered 2022-01-25 – 2022-01-26 (×4): 0.04 [IU]/min via INTRAVENOUS
  Administered 2022-01-27: 0.02 [IU]/min via INTRAVENOUS
  Administered 2022-01-27: 0.04 [IU]/min via INTRAVENOUS
  Filled 2022-01-13 (×33): qty 100

## 2022-01-13 MED ORDER — VANCOMYCIN HCL 1500 MG/300ML IV SOLN
1500.0000 mg | Freq: Once | INTRAVENOUS | Status: AC
  Administered 2022-01-13: 1500 mg via INTRAVENOUS
  Filled 2022-01-13: qty 300

## 2022-01-13 MED ORDER — PROSOURCE TF PO LIQD
45.0000 mL | Freq: Two times a day (BID) | ORAL | Status: DC
  Administered 2022-01-13 – 2022-02-04 (×44): 45 mL
  Filled 2022-01-13 (×44): qty 45

## 2022-01-13 NOTE — Progress Notes (Signed)
° °   °  [address]       [place] [address]             [phone]      Awake, c/o incisional pain  BP (!) 113/54    Pulse (!) 111    Temp 98.4 F (36.9 C) (Oral)    Resp 17    Ht 4' 11.5" (1.511 m)    Wt 74 kg    SpO2 100%    BMI 32.40 kg/m   Now up to 26 of norepi and 8 of epi Vasopressin started Likely infection- started on Vanco and Zosyn Running even on CVVH currently  Nilo Fallin C. Roxan Hockey, MD [facility] [phone]

## 2022-01-13 NOTE — Progress Notes (Signed)
Unable to send accurate CoOx. Patients IJ with transducer does not draw back, only other access is Arterial. CoOx sent came back high due to arterial draw.

## 2022-01-13 NOTE — Procedures (Signed)
Central Venous Catheter Insertion Procedure Note  FANTASHIA SHUPERT  [id number]  [birth year]  Date:01/13/22  Time:5:14 PM   Provider Performing:Jalilah Wiltsie Jerilynn Mages Verlee Monte   Procedure: Insertion of Non-tunneled Central Venous [id number]) with US guidance (29937)   Indication(s) Medication administration and Difficult access  Consent Risks of the procedure as well as the alternatives and risks of each were explained to the patient and/or caregiver.  Consent for the procedure was obtained and is signed in the bedside chart  Anesthesia Topical only with 1% lidocaine   Timeout Verified patient identification, verified procedure, site/side was marked, verified correct patient position, special equipment/implants available, medications/allergies/relevant history reviewed, required imaging and test results available.  Sterile Technique Maximal sterile technique including full sterile barrier drape, hand hygiene, sterile gown, sterile gloves, mask, hair covering, sterile ultrasound probe cover (if used).  Procedure Description Area of catheter insertion was cleaned with chlorhexidine and draped in sterile fashion.  With real-time ultrasound guidance a central venous catheter was placed into the right femoral vein. Nonpulsatile blood flow and easy flushing noted in all ports.  The catheter was sutured in place and sterile dressing applied.  Complications/Tolerance None; patient tolerated the procedure well.  Chest x-ray is not ordered for femoral cannulation.  EBL Minimal  Specimen(s) None

## 2022-01-13 NOTE — Procedures (Signed)
Central Venous Catheter Insertion Procedure Note  Jody Nguyen  [id number]  Feb 28, 1956  Date:01/13/22  Time:1:57 PM   Provider Performing:Angel Weedon Jerilynn Mages Verlee Monte   Procedure: Insertion of Non-tunneled Central Venous [phone]) with US guidance (38453)   Indication(s) Medication administration and Difficult access  Consent Risks of the procedure as well as the alternatives and risks of each were explained to the patient and/or caregiver.  Consent for the procedure was obtained and is signed in the bedside chart  Anesthesia Topical only with 1% lidocaine   Timeout Verified patient identification, verified procedure, site/side was marked, verified correct patient position, special equipment/implants available, medications/allergies/relevant history reviewed, required imaging and test results available.  Sterile Technique Maximal sterile technique including full sterile barrier drape, hand hygiene, sterile gown, sterile gloves, mask, hair covering, sterile ultrasound probe cover (if used).  Procedure Description Area of catheter insertion was cleaned with chlorhexidine and draped in sterile fashion.  With real-time ultrasound guidance a central venous catheter was placed into the left subclavian vein. Challenging placement. Wire was visualized in subclavivan vein in 2 views but could not pass beyond 12cm. Removed left IJ introducer sheath which seemed to facilitate passage of wire. Nonpulsatile blood flow and easy flushing noted in all ports.  The catheter was sutured in place and sterile dressing applied.  Complications/Tolerance Malpositioned in accessory vein. Catheter removed. Bleeding at entry site, thombi pad applied and pressure held for 30 min.   EBL 100cc  Specimen(s) None

## 2022-01-13 NOTE — Progress Notes (Signed)
7 Days Post-Op Procedure(s) (LRB): CORONARY ARTERY BYPASS GRAFTING (CABG) TIMES THREE ON CARDIOPULMONARY BYPASS. LIMA TO LAD, SVG TO PD, SVG TO DIAG (N/A) TRICUSPID VALVE REPAIR USING AN 36MM EDWARDS MC3 TRICUSPID ANNULOPLASTY RING (N/A) TRANSESOPHAGEAL ECHOCARDIOGRAM (TEE) (N/A) APPLICATION OF CELL SAVER (N/A) ENDOVEIN HARVEST OF GREATER SAPHENOUS VEIN (Right) REPAIR OF PATENT FORAMEN OVALE (N/A) Subjective: No complaints this AM, denies pain  Objective: Vital signs in last 24 hours: Temp:  [97.5 F (36.4 C)-98.9 F (37.2 C)] 98.9 F (37.2 C) (02/17 0345) Pulse Rate:  [97-125] 111 (02/17 0645) Cardiac Rhythm: Sinus tachycardia (02/17 0600) Resp:  [13-32] 17 (02/17 0645) SpO2:  [92 %-100 %] 94 % (02/17 0600) Arterial Line BP: (95-126)/(48-65) 112/55 (02/17 0645) FiO2 (%):  [40 %] 40 % (02/16 0811) Weight:  [74 kg] 74 kg (02/17 0500)  Hemodynamic parameters for last 24 hours: CVP:  [9 mmHg-40 mmHg] 22 mmHg  Intake/Output from previous day: 02/16 0701 - 02/17 0700 In: 1694 [I.V.:829; NG/GT:865] Out: 1892  Intake/Output this shift: No intake/output data recorded.  General appearance: alert, cooperative, and no distress Neurologic: intact Heart: regular rate and rhythm Lungs: clear to auscultation bilaterally Abdomen: normal findings: soft, non-tender Wound: clean and dry  Lab Results: Recent Labs    01/12/22 0211 01/12/22 1822 01/13/22 0411  WBC 22.5*  --  25.8*  HGB 8.6* 9.9* 8.7*  HCT 28.0* 29.0* 27.1*  PLT 59*  --  90*   BMET:  Recent Labs    01/12/22 1557 01/12/22 1822 01/13/22 0411  NA 137 138 135  K 4.5 4.4 4.7  CL 104  --  103  CO2 24  --  25  GLUCOSE 178*  --  186*  BUN 13  --  15  CREATININE 1.32*  --  1.38*  CALCIUM 9.0  --  9.2    PT/INR:  Recent Labs    01/11/22 0418  LABPROT 18.0*  INR 1.5*   ABG    Component Value Date/Time   PHART 7.541 (H) 01/12/2022 1822   HCO3 23.9 01/12/2022 1822   TCO2 25 01/12/2022 1822   ACIDBASEDEF  8.0 (H) 01/07/2022 0648   O2SAT 100 01/13/2022 0411   CBG (last 3)  Recent Labs    01/12/22 2008 01/13/22 0014 01/13/22 0337  GLUCAP 126* 136* 172*    Assessment/Plan: S/P Procedure(s) (LRB): CORONARY ARTERY BYPASS GRAFTING (CABG) TIMES THREE ON CARDIOPULMONARY BYPASS. LIMA TO LAD, SVG TO PD, SVG TO DIAG (N/A) TRICUSPID VALVE REPAIR USING AN 36MM EDWARDS MC3 TRICUSPID ANNULOPLASTY RING (N/A) TRANSESOPHAGEAL ECHOCARDIOGRAM (TEE) (N/A) APPLICATION OF CELL SAVER (N/A) ENDOVEIN HARVEST OF GREATER SAPHENOUS VEIN (Right) REPAIR OF PATENT FORAMEN OVALE (N/A) -continues to slowly progress CV- in ST, still on epi and Norepi  Wean drips as BP allows RESP- IS RENAL- on CVVHD  I/o near even yesterday, - 10L overall ENDO- CBG reasonably well controlled GI- on TF via Cor track  Swallow eval ID- WBC trending up at 25 K, no fevers  Did receive IV steroids Thrombocytopenia- PLT up to 90K  On bivalrudin Deconditioning- mobilization limited with groin lines, CVVH     LOS: 12 days    Jody Nguyen 01/13/2022

## 2022-01-13 NOTE — Progress Notes (Addendum)
[place] for bivalirudin Indication: history of pulmonary embolus 2021  Allergies  Allergen Reactions   Minoxidil Other (See Comments)    Pericardial effusion   Lipitor [Atorvastatin] Other (See Comments)    MYALGIAS > "pain in legs" Tolerates rosuvastatin   Morphine And Related Itching   Ace Inhibitors Other (See Comments)    REACTION: "not sure...think it made me drowsy all the time"    Patient Measurements: Height: 4' 11.5" (151.1 cm) Weight: 74 kg (163 lb 2.3 oz) IBW/kg (Calculated) : 44.35  Vital Signs: Temp: 98.9 F (37.2 C) (02/17 0345) Temp Source: Axillary (02/17 0345) Pulse Rate: 111 (02/17 0645)  Labs: Recent Labs    01/11/22 0418 01/11/22 1609 01/12/22 0211 01/12/22 1000 01/12/22 1557 01/12/22 1822 01/13/22 0411  HGB 8.9*  --  8.6*  --   --  9.9* 8.7*  HCT 27.3*  --  28.0*  --   --  29.0* 27.1*  PLT 49*  --  59*  --   --   --  90*  APTT  --    < > 79* 77*  --   --  72*  LABPROT 18.0*  --   --   --   --   --   --   INR 1.5*  --   --   --   --   --   --   CREATININE 1.61*   < > 1.23*  --  1.32*  --  1.38*   < > = values in this interval not displayed.     Estimated Creatinine Clearance: 35.6 mL/min (A) (by C-G formula based on SCr of 1.38 mg/dL (H)).   Assessment: 66 yo female on chronic apixaban for hx of PE 2021  Apixaban has been held for surgery.  Pharmacy asked to resume anticoagulation 2/15 with bivalirudin.  Platelet count rising, now back to 90s.  No overt bleeding or complications noted.  HIT antibody and SRA both negative.  Bivalirudin drip rate 0.02 mg/kg/hr; APTT 72 seconds. Hgb low but stable 8-9 Pacing wires pulled 2/15 Currently on CRRT   Goal of Therapy:  aPTT goal ~50-70sec - target lower end given low platelet count/risk of post-operative bleeding Monitor platelets by anticoagulation protocol: Yes   Plan:  Continue bivalirudin at 0.02 mg/kg/hr . Daily APTT and CBC  [place] Crane,  Roylene Reason, BCCP Clinical Pharmacist  01/13/2022 7:22 AM   [facility] pharmacy phone numbers are listed on [email or web address]

## 2022-01-13 NOTE — Progress Notes (Signed)
Nutrition Follow-up  DOCUMENTATION CODES:   Not applicable  INTERVENTION:   -Continue renal MVI daily -TF via cortrak:   Vital 1.5 @ 45 ml/hr  45 ml Prosource TF BID  Regimen provides 1700 kcals, 95 grams protein, and 821 ml free water daily  NUTRITION DIAGNOSIS:   Inadequate oral intake related to inability to eat as evidenced by NPO status.  Ongoing  GOAL:   Patient will meet greater than or equal to 90% of their needs  Met with TF  MONITOR:   Diet advancement, Labs, Weight trends, TF tolerance, Skin, I & O's  REASON FOR ASSESSMENT:   Consult Enteral/tube feeding initiation and management  ASSESSMENT:   66 y.o. female with history of HTN, DM type 2, HKD, pulmonary HTN, ESRD on HD, CHF, hx CVA, and vitamin D deficiency, presented to ED with radiating chest pain, SOB, and nausea.  2/07 Left heart catherization, showed severe coronary calcification and multivessel CAD. 2/10  CABG x 3, TVR 2/11  CRRT initiated 2/13 TF initiated 2/14 TF on  hold for emesis 2/15 cortrak placed (gastric), TF resumed 2/16 extubated  Reviewed I/O's: -198 ml x 24 hours and -9.9 L since admission  Per vascular surgery notes, rt RC AVF not functional; possible plan for [facility] next week. Plan to continue CRRT for now and transition to iHD once off pressors.   Per MD notes, plan for SLP eval, however, pt remains NPO. TF continues via cortrak, but will adjust regimen due to adjusted needs post-extubation.   Medications reviewed and include colace, precedex, adrenalin, and levophed.   Labs reviewed: CBGS: 112-172 (inpatient orders for glycemic control are 1-3 units insulin aspart every 4 hours, 4 units insulin aspart every 4 hours, and 15 units insulin detemir BID).    Diet Order:   Diet Order             Diet NPO time specified  Diet effective now                   EDUCATION NEEDS:   Not appropriate for education at this time  Skin:  Skin Assessment: Skin Integrity  Issues: Skin Integrity Issues:: Incisions Incisions: closed chest, rt leg  Last BM:  01/13/22 (via rectal tube)  Height:   Ht Readings from Last 1 Encounters:  01/01/22 4' 11.5" (1.511 m)    Weight:   Wt Readings from Last 1 Encounters:  01/13/22 74 kg    Ideal Body Weight:  45.5 kg  BMI:  Body mass index is 32.4 kg/m.  Estimated Nutritional Needs:   Kcal:  1650-1850  Protein:  90-105 grams  Fluid:  1L+UOP    Loistine Chance, RD, LDN, [place] Registered Dietitian II Certified Diabetes Care and Education Specialist Please refer to [facility] for RD and/or RD on-call/weekend/after hours pager

## 2022-01-13 NOTE — Progress Notes (Signed)
[facility] Progress Note  Name: Jody Nguyen MRN: [id number] DOB: 11-Jan-1956   Subjective:  Patient seen and examined on CRRT.  Extubated yesterday Off milrinone. Remains on levo/epi Running net even on crrt  Intake/Output Summary (Last 24 hours) at 01/13/2022 0750 Last data filed at 01/13/2022 0700 Gross per 24 hour  Intake 1693.95 ml  Output 1892 ml  Net -198.05 ml    Vitals:  Vitals:   01/13/22 0600 01/13/22 0615 01/13/22 0630 01/13/22 0645  BP:      Pulse: (!) 105 (!) 111 (!) 113 (!) 111  Resp: 19 (!) 22 (!) 21 17  Temp:      TempSrc:      SpO2: 94%     Weight:      Height:         Physical Exam:  General: tired appearing, NAD Lungs: cta bl Heart S1S2  Abdomen soft obese Extremities: generalized edema Neuro : awake, tired Access: right arm AVF in place, no bruit or thrill/swollen; left fem temp HD line c/d/i  Medications reviewed   Labs:  BMP Latest Ref Rng & Units 01/13/2022 01/12/2022 01/12/2022  Glucose 70 - 99 mg/dL 186(H) - 178(H)  BUN 8 - 23 mg/dL 15 - 13  Creatinine 0.44 - 1.00 mg/dL 1.38(H) - 1.32(H)  BUN/Creat Ratio 6 - 22 (calc) - - -  Sodium 135 - 145 mmol/L 135 138 137  Potassium 3.5 - 5.1 mmol/L 4.7 4.4 4.5  Chloride 98 - 111 mmol/L 103 - 104  CO2 22 - 32 mmol/L 25 - 24  Calcium 8.9 - 10.3 mg/dL 9.2 - 9.0   Dialysis Orders: MWF at [facility] 3:15hr, 450/A1.5, EDW 75.5kg, 2K/2Ca, UFP #2, AVF, heparin 2500 unit bolus - Hectoral 82mcg IV q HD - Sensipar 90mg  PO q HD - Mircera 144mcg IV q 2 weeks (ordered, not yet given. Last Mircera 100 on 12/07/21)   Assessment/Plan:   CAD s/p CABG x3, TR sp TVR:  per CT surgery   ESRD:  normally MWF schedule, last IHD Thursday 2/09. Remains on pressors x 3. CRRT started 2/11, remains critically ill. Cont CRRT for now, plan to transition to IHD as she comes off pressors Infiltrated AVF - AVF infiltrated on Tuesday 2/7. Expert cannulator able to stick 2/09 and completed full treatment. Not  functional, VVS following. Planning for [facility] possibly next week provided she remains stable, appreciate assistance from VVS Hypotension - down to 2 pressors. Pressor support per primary service Leukocytosis-per pmd Thrombycytopenia-likely related to crrt. per PMD, on angiomax Volume: hx of HTN now in shock post-op. Running net even. Can increase slightly to net neg 50cc/hr if needed  Anemia CKD : s/p PRBC's , Hb 8.7, transfuse prn  Metabolic bone disease: resume home binders and sensipar when taking PO. Cont IV vdra.   Pulm HTN:  Followed at [facility], on Revatio and opsumit  T2DM-per primary service  Hx of MV replacement - in 2016  Gean Quint, MD 01/13/2022 7:50 AM

## 2022-01-13 NOTE — Procedures (Signed)
Central Venous Catheter Insertion Procedure Note  Jody Nguyen  [id number]  1956-10-31  Date:01/13/22  Time:2:05 PM   Provider Performing:Brooke Moshe Cipro   Procedure: Insertion of Non-tunneled Central Venous [phone]) with US guidance (99242)   Indication(s) Medication administration and Difficult access  Consent Risks of the procedure as well as the alternatives and risks of each were explained to the patient and/or caregiver.  Consent for the procedure was obtained and is signed in the bedside chart  Anesthesia Topical only with 1% lidocaine   Timeout Verified patient identification, verified procedure, site/side was marked, verified correct patient position, special equipment/implants available, medications/allergies/relevant history reviewed, required imaging and test results available.  Sterile Technique Maximal sterile technique including full sterile barrier drape, hand hygiene, sterile gown, sterile gloves, mask, hair covering, sterile ultrasound probe cover (if used).  Procedure Description Area of catheter insertion was cleaned with chlorhexidine and draped in sterile fashion.  With real-time ultrasound guidance, R IJ cannulated however was unable to pass guide wire beyond 10 cm due to obstruction.  Attempts to rewire and second stick performed with same findings.  Procedure aborted.  Manual pressure was held as patient is oozy.  A thrombi pad and dressing was placed once site stable.  Will check CXR to ensure no complications.   EBL Minimal  Specimen(s) None    Kennieth Rad, ACNP [facility] 01/13/2022, 2:10 PM  See Amion for pager If no response to pager, please call PCCM consult pager After 7:00 pm call Elink

## 2022-01-13 NOTE — Progress Notes (Signed)
NAME:  Jody Nguyen, MRN:  [id number], DOB:  [birth year], LOS: 76 ADMISSION DATE:  01/18/2022, CONSULTATION DATE:  12/30/2021 REFERRING MD:  Martie Lee, TRH, CHIEF COMPLAINT:  SOB   History of Present Illness:  66 yo F PMH ESRD, non-obstructive CAD, suspected pulmonary hypertension, Hx PE on eliquis who presented to [facility] ED 2/4 with chest pain, which radiates to arm. Similar sx had also promoted ED presentation on 2/1 at which time trops were 100. She was seen by cardiology outpt on 2/2 and chest pain was felt to be related to volume shift as pain had occurred during dialysis.   On 2/4 presentation, Pt states that episodic chest pain has been intermittent for the past week. Non-exertional, lasts approx 10 minutes. Associated nausea and SOB. Non-specific EKG changes  & was d/w cardiology, ruled out for STEMI. Trop 200. Placed on [place] for SpO2 80s.Admitted to [facility] for obvs with concern for PE and started on heparin gtt. Awaiting VQ scan. ECHO has been performed which reveals Severe RV RA enlargement, severely reduced RV function and septal bowing, felt consistent with chronic process such as severe pulmonary HTN.    PCCM has been consulted in this setting. She remains critically ill.   Pertinent  Medical History  ESRD PE Pulmonary HTN  CAD CVA HLD HTN S/p MV repair  Pericardial effusion r/t minoxidil  Diastolic HF DM2  Significant Hospital Events: Including procedures, antibiotic start and stop dates in addition to other pertinent events   2/4 ED presentation with recurring chest pain -- STEMI ruled out. Stable, mildly hypoxic and placed on [place] 2/5 admit to [facility] for obvs. Concern for PE, ECHO performed by cardiology reportedly shows RV failure, but felt more consistent with chronic process like severe pulmHTN. Awaiting VQ scan. Pulm consulted  2/7 cardiac cath. 3 vessel dz. CABG rec 2/10 CABG, TVR 2/11 continued vasoplegic shock with very high vasopressor requirements  2/12 Given methylene blue  x 2 2/13 chest tube removed 2/14 JP removed 2/15 pacing wires removed 2/16 milrinone stopped, extubated  Interim History / Subjective:  Drowsy but arousable, follows commands  -10L admission, -200cc past 24 hours  Precedex off this morning Levo up to 18 Epi up to 7  WBCs rising  Objective   Blood pressure (!) 113/54, pulse (!) 111, temperature 98.5 F (36.9 C), temperature source Axillary, resp. rate 17, height 4' 11.5" (1.511 m), weight 74 kg, SpO2 94 %. CVP:  [9 mmHg-40 mmHg] 22 mmHg  Vent Mode: PSV;CPAP FiO2 (%):  [40 %] 40 % PEEP:  [5 cmH20] 5 cmH20 Pressure Support:  [8 cmH20] 8 cmH20   Intake/Output Summary (Last 24 hours) at 01/13/2022 0756 Last data filed at 01/13/2022 0700 Gross per 24 hour  Intake 1693.95 ml  Output 1892 ml  Net -198.05 ml   Filed Weights   01/11/22 0307 01/12/22 0500 01/13/22 0500  Weight: 76.1 kg 73.2 kg 74 kg    Examination: General appearance: 66 y.o., female, drowsy but arousable Eyes: PERRL, tracking appropriately HENT: NCAT; dry MM Neck: cordis in place Lungs: diminished bl, with normal respiratory effort CV: RRR, no murmur  Abdomen: Soft, non-tender; non-distended, BS present  Extremities: 1+ peripheral edema, warm, AVF bl without bruit Neuro: grossly no focal deficit    Ancillary tests personally reviewed:    WBC rising Fem coox 62.3% (unable to draw back on LIJ cordis)  Assessment & Plan:  Coronary artery disease: 3 vessel disease diagnosed on cardiac cath 2/7.  CABG x3 on 2/10  LIMA to LAD, SVG to PD, SVG to DIAG S/p CABG 2/10. Tricuspid regurgitation, severe: S/P TVR 2/10 tricuspid annuloplasty ring in place HX S/p MVR 2016 Acute on chronic systolic/diastolic CHF Cardiogenic shock: in the post cardiac surgery setting, improving  CI 2.3, CVP 16-17, -9.7L admission, -2.4L past 24 hours. -TCTS following appreciate assistance and management -Goal MAP 60-65. Continue epi, wean levo first to goal map -Check coox once new  central line placed -Continue CVVHD as below -Continue Solucortef 50 BID, tapering  Leukocytosis: We'll try our best to exchange lines.  - BCx, vanc/zosyn for 48h - if no improvement in hemodynamics, leukocytosis and source remains unclear then discontinue - discontinue introducer sheath - new central line, arterial line - if BCx positive then will need new vascath as well, likely too unstable for placement of TDC  Acute respiratory failure with hypoxia  Combined pre and post capillary PH  -Was on revatio and opsumit. Will reassess over course of hospitalization but limited evidence base in patients with valve related post capillary PH after repair/replacement.   ESRD on HD Thrombosed AVF Neph,vascular suspect clotted AVF -Neph following appreciate assistance  -Will address thrombosed AVF once no longer critically ill. -Will need TDC when more stable -Continue CRRT. Goal UF -50/hr as tolerated  HLD DM2 -Blood Glucose goal 140-180. -SSI -Continue levemir to 15 u  Acute blood loss anemia Suspect secondary to surgery, frequent CRRT circuit changes, and chronic kidney disease -Transfuse PRBC if HBG less than 7 -Obtain AM CBC to trend H&H -Monitor for signs of bleeding  Leukocytosis WBC 17.1>18.3>23>22.5. Suspect secondary to steroids, shock. Afebrile. Weaning steroids -Monitor on AM CBC -Follow fever/WBC curve  Hx PE  Thrombocytopenia - HITT vs CPB related. PLT 123>52>52>48>59. HITT panel 0.075. SRA negative -Will restart bivalirudin after line exchange -Was on [facility] previously for PE   Best Practice (right click and "Reselect all SmartList Selections" daily)   Diet/type: tubefeeds   DVT prophylaxis: other- on hold due to thrombocytopenia.  GI prophylaxis: PPI Lines: Central line, Dialysis Catheter, Arterial Line, and yes and it is still needed - will need to replace TLC, arterial line today Foley:  N/A Code Status:  full code Last date of multidisciplinary goals of  care discussion [Family updated 2/14]   Critical care time: 35 minutes  [facility]  01/13/2022 , 7:56 AM  Please see [email or web address] for pager details  If no response, please call [phone] After hours, please call Elink at [phone]

## 2022-01-13 NOTE — Progress Notes (Signed)
Pharmacy Antibiotic Note  Jody Nguyen is a 66 y.o. female admitted on 01/02/2022 with  possible sepsis .  Pharmacy has been consulted for vancomycin and zosyn dosing.  Remains on CRRT.  Plan: Vancomycin 1500 mg IV x 1 now, then 750 mg daily. Zosyn 3.375g IV q 6 hrs. F/u cultures, clinical course.  Height: 4' 11.5" (151.1 cm) Weight: 74 kg (163 lb 2.3 oz) IBW/kg (Calculated) : 44.35  Temp (24hrs), Avg:98.4 F (36.9 C), Min:97.7 F (36.5 C), Max:98.9 F (37.2 C)  Recent Labs  Lab 01/09/22 0509 01/09/22 1446 01/10/22 0623 01/10/22 1509 01/11/22 0418 01/11/22 1609 01/12/22 0211 01/12/22 1557 01/13/22 0411  WBC 17.1*  --  18.3*  --  23.0*  --  22.5*  --  25.8*  CREATININE 2.15*   < > 1.82*   < > 1.61* 1.42* 1.23* 1.32* 1.38*   < > = values in this interval not displayed.    Estimated Creatinine Clearance: 35.6 mL/min (A) (by C-G formula based on SCr of 1.38 mg/dL (H)).    Allergies  Allergen Reactions   Minoxidil Other (See Comments)    Pericardial effusion   Lipitor [Atorvastatin] Other (See Comments)    MYALGIAS > "pain in legs" Tolerates rosuvastatin   Morphine And Related Itching   Ace Inhibitors Other (See Comments)    REACTION: "not sure...think it made me drowsy all the time"    Antimicrobials this admission: Vancomycin 2/17 >  Zosyn 2/17 >  Dose adjustments this admission:  Microbiology results: 2/17 BCx x 2 >  Thank you for allowing pharmacy to be a part of this patients care.  [place] Crane, Roylene Reason, BCCP Clinical Pharmacist  01/13/2022 1:52 PM   [facility] pharmacy phone numbers are listed on [email or web address]

## 2022-01-14 ENCOUNTER — Inpatient Hospital Stay (HOSPITAL_COMMUNITY): Payer: Medicare Other

## 2022-01-14 DIAGNOSIS — R579 Shock, unspecified: Secondary | ICD-10-CM | POA: Diagnosis not present

## 2022-01-14 LAB — BLOOD CULTURE ID PANEL (REFLEXED) - BCID2

## 2022-01-14 LAB — CBC
HCT: 28.4 % — ABNORMAL LOW (ref 36.0–46.0)
Hemoglobin: 8.8 g/dL — ABNORMAL LOW (ref 12.0–15.0)
MCH: 28.7 pg (ref 26.0–34.0)
MCHC: 31 g/dL (ref 30.0–36.0)
MCV: 92.5 fL (ref 80.0–100.0)
Platelets: 91 10*3/uL — ABNORMAL LOW (ref 150–400)
RBC: 3.07 MIL/uL — ABNORMAL LOW (ref 3.87–5.11)
RDW: 21.2 % — ABNORMAL HIGH (ref 11.5–15.5)
WBC: 25.9 10*3/uL — ABNORMAL HIGH (ref 4.0–10.5)
nRBC: 37.9 % — ABNORMAL HIGH (ref 0.0–0.2)

## 2022-01-14 LAB — TYPE AND SCREEN
ABO/RH(D): O POS
Antibody Screen: NEGATIVE
Unit division: 0

## 2022-01-14 LAB — POCT I-STAT 7, (LYTES, BLD GAS, ICA,H+H)
Acid-Base Excess: 2 mmol/L (ref 0.0–2.0)
Acid-Base Excess: 3 mmol/L — ABNORMAL HIGH (ref 0.0–2.0)
Bicarbonate: 26.6 mmol/L (ref 20.0–28.0)
Bicarbonate: 27.2 mmol/L (ref 20.0–28.0)
Calcium, Ion: 1.29 mmol/L (ref 1.15–1.40)
Calcium, Ion: 1.29 mmol/L (ref 1.15–1.40)
HCT: 30 % — ABNORMAL LOW (ref 36.0–46.0)
HCT: 30 % — ABNORMAL LOW (ref 36.0–46.0)
Hemoglobin: 10.2 g/dL — ABNORMAL LOW (ref 12.0–15.0)
Hemoglobin: 10.2 g/dL — ABNORMAL LOW (ref 12.0–15.0)
O2 Saturation: 100 %
O2 Saturation: 100 %
Patient temperature: 99.5
Patient temperature: 99.7
Potassium: 4.9 mmol/L (ref 3.5–5.1)
Potassium: 5.1 mmol/L (ref 3.5–5.1)
Sodium: 135 mmol/L (ref 135–145)
Sodium: 137 mmol/L (ref 135–145)
TCO2: 28 mmol/L (ref 22–32)
TCO2: 28 mmol/L (ref 22–32)
pCO2 arterial: 40.4 mmHg (ref 32–48)
pCO2 arterial: 41.5 mmHg (ref 32–48)
pH, Arterial: 7.425 (ref 7.35–7.45)
pH, Arterial: 7.428 (ref 7.35–7.45)
pO2, Arterial: 238 mmHg — ABNORMAL HIGH (ref 83–108)
pO2, Arterial: 243 mmHg — ABNORMAL HIGH (ref 83–108)

## 2022-01-14 LAB — APTT: aPTT: 67 seconds — ABNORMAL HIGH (ref 24–36)

## 2022-01-14 LAB — GLUCOSE, CAPILLARY
Glucose-Capillary: 229 mg/dL — ABNORMAL HIGH (ref 70–99)
Glucose-Capillary: 217 mg/dL — ABNORMAL HIGH (ref 70–99)
Glucose-Capillary: 153 mg/dL — ABNORMAL HIGH (ref 70–99)
Glucose-Capillary: 118 mg/dL — ABNORMAL HIGH (ref 70–99)

## 2022-01-14 LAB — RENAL FUNCTION PANEL
Albumin: 2.7 g/dL — ABNORMAL LOW (ref 3.5–5.0)
Albumin: 2.7 g/dL — ABNORMAL LOW (ref 3.5–5.0)
Anion gap: 10 (ref 5–15)
Anion gap: 9 (ref 5–15)
BUN: 22 mg/dL (ref 8–23)
BUN: 28 mg/dL — ABNORMAL HIGH (ref 8–23)
CO2: 24 mmol/L (ref 22–32)
CO2: 25 mmol/L (ref 22–32)
Calcium: 9.2 mg/dL (ref 8.9–10.3)
Calcium: 9.6 mg/dL (ref 8.9–10.3)
Chloride: 103 mmol/L (ref 98–111)
Chloride: 104 mmol/L (ref 98–111)
Creatinine, Ser: 1.56 mg/dL — ABNORMAL HIGH (ref 0.44–1.00)
Creatinine, Ser: 1.89 mg/dL — ABNORMAL HIGH (ref 0.44–1.00)
GFR, Estimated: 36 mL/min — ABNORMAL LOW (ref >60)
GFR, Estimated: 29 mL/min — ABNORMAL LOW (ref >60)
Glucose, Bld: 244 mg/dL — ABNORMAL HIGH (ref 70–99)
Glucose, Bld: 128 mg/dL — ABNORMAL HIGH (ref 70–99)
Phosphorus: 2.9 mg/dL (ref 2.5–4.6)
Phosphorus: 3.6 mg/dL (ref 2.5–4.6)
Potassium: 5.1 mmol/L (ref 3.5–5.1)
Potassium: 5.2 mmol/L — ABNORMAL HIGH (ref 3.5–5.1)
Sodium: 137 mmol/L (ref 135–145)
Sodium: 138 mmol/L (ref 135–145)

## 2022-01-14 LAB — BPAM RBC
Blood Product Expiration Date: 202303212359
ISSUE DATE / TIME: 202302171710
Unit Type and Rh: 5100

## 2022-01-14 LAB — HEPATIC FUNCTION PANEL
ALT: 14 U/L (ref 0–44)
AST: 56 U/L — ABNORMAL HIGH (ref 15–41)
Albumin: 2.7 g/dL — ABNORMAL LOW (ref 3.5–5.0)
Alkaline Phosphatase: 132 U/L — ABNORMAL HIGH (ref 38–126)
Bilirubin, Direct: 0.6 mg/dL — ABNORMAL HIGH (ref 0.0–0.2)
Indirect Bilirubin: 0.7 mg/dL (ref 0.3–0.9)
Total Bilirubin: 1.3 mg/dL — ABNORMAL HIGH (ref 0.3–1.2)
Total Protein: 6.3 g/dL — ABNORMAL LOW (ref 6.5–8.1)

## 2022-01-14 LAB — COOXEMETRY PANEL
Carboxyhemoglobin: 1.9 % — ABNORMAL HIGH (ref 0.5–1.5)
Methemoglobin: <0.7 % (ref 0.0–1.5)
O2 Saturation: 66.9 %
Total hemoglobin: 9.1 g/dL — ABNORMAL LOW (ref 12.0–16.0)

## 2022-01-14 LAB — CBC WITH DIFFERENTIAL/PLATELET
Abs Immature Granulocytes: 1.68 10*3/uL — ABNORMAL HIGH (ref 0.00–0.07)
Basophils Absolute: 0.1 10*3/uL (ref 0.0–0.1)
Basophils Relative: 1 %
Eosinophils Absolute: 0.1 10*3/uL (ref 0.0–0.5)
Eosinophils Relative: 0 %
HCT: 28 % — ABNORMAL LOW (ref 36.0–46.0)
Hemoglobin: 9 g/dL — ABNORMAL LOW (ref 12.0–15.0)
Immature Granulocytes: 7 %
Lymphocytes Relative: 5 %
Lymphs Abs: 1.3 10*3/uL (ref 0.7–4.0)
MCH: 29.3 pg (ref 26.0–34.0)
MCHC: 32.1 g/dL (ref 30.0–36.0)
MCV: 91.2 fL (ref 80.0–100.0)
Monocytes Absolute: 1.6 10*3/uL — ABNORMAL HIGH (ref 0.1–1.0)
Monocytes Relative: 6 %
Neutro Abs: 21.1 10*3/uL — ABNORMAL HIGH (ref 1.7–7.7)
Neutrophils Relative %: 81 %
Platelets: 93 10*3/uL — ABNORMAL LOW (ref 150–400)
RBC: 3.07 MIL/uL — ABNORMAL LOW (ref 3.87–5.11)
RDW: 21.3 % — ABNORMAL HIGH (ref 11.5–15.5)
WBC: 25.9 10*3/uL — ABNORMAL HIGH (ref 4.0–10.5)
nRBC: 33.3 % — ABNORMAL HIGH (ref 0.0–0.2)

## 2022-01-14 LAB — MAGNESIUM: Magnesium: 2.6 mg/dL — ABNORMAL HIGH (ref 1.7–2.4)

## 2022-01-14 LAB — CORTISOL-AM, BLOOD: Cortisol - AM: 62.7 ug/dL — ABNORMAL HIGH (ref 6.7–22.6)

## 2022-01-14 MED ORDER — MIDAZOLAM HCL 2 MG/2ML IJ SOLN
1.0000 mg | Freq: Once | INTRAMUSCULAR | Status: AC
  Administered 2022-01-14: 1 mg via INTRAVENOUS

## 2022-01-14 MED ORDER — FOOD THICKENER (SIMPLYTHICK HONEY)
10.0000 | ORAL | Status: DC | PRN
  Administered 2022-01-28: 1 via ORAL

## 2022-01-14 MED ORDER — SODIUM CHLORIDE 0.9 % IV SOLN
0.0160 mg/kg/h | INTRAVENOUS | Status: DC
  Administered 2022-01-14: 21:00:00 0.13 mg/kg/h via INTRAVENOUS
  Administered 2022-01-19 – 2022-01-23 (×2): 0.0135 mg/kg/h via INTRAVENOUS
  Administered 2022-01-25: 0.016 mg/kg/h via INTRAVENOUS
  Filled 2022-01-14 (×8): qty 250

## 2022-01-14 MED ORDER — FENTANYL CITRATE PF 50 MCG/ML IJ SOSY
50.0000 ug | PREFILLED_SYRINGE | Freq: Once | INTRAMUSCULAR | Status: AC
  Administered 2022-01-14: 50 ug via INTRAVENOUS

## 2022-01-14 MED ORDER — MIDAZOLAM HCL 2 MG/2ML IJ SOLN
INTRAMUSCULAR | Status: AC
  Filled 2022-01-14: qty 2

## 2022-01-14 MED ORDER — "THROMBI-PAD 3""X3"" EX PADS"
1.0000 | MEDICATED_PAD | Freq: Once | CUTANEOUS | Status: DC
  Filled 2022-01-14: qty 2

## 2022-01-14 MED ORDER — FENTANYL CITRATE PF 50 MCG/ML IJ SOSY
PREFILLED_SYRINGE | INTRAMUSCULAR | Status: AC
  Filled 2022-01-14: qty 1

## 2022-01-14 MED ORDER — FENTANYL CITRATE PF 50 MCG/ML IJ SOSY: 50.0000 ug | PREFILLED_SYRINGE | Freq: Once | INTRAMUSCULAR | Status: AC

## 2022-01-14 MED ORDER — FENTANYL CITRATE PF 50 MCG/ML IJ SOSY
PREFILLED_SYRINGE | INTRAMUSCULAR | Status: AC
  Administered 2022-01-14: 50 ug via INTRAVENOUS
  Filled 2022-01-14: qty 1

## 2022-01-14 NOTE — Procedures (Signed)
Arterial Catheter Insertion Procedure Note  Jody Nguyen  [id number]  08-16-1956  Date:01/14/22  Time:9:16 PM    Provider Performing: Maryjane Hurter    Procedure: Insertion of Arterial Line [phone]) with US guidance ([id number])   Indication(s) Blood pressure monitoring and/or need for frequent ABGs  Consent Risks of the procedure as well as the alternatives and risks of each were explained to the patient and/or caregiver.  Consent for the procedure was obtained and is signed in the bedside chart  Anesthesia 1% lidocaine   Time Out Verified patient identification, verified procedure, site/side was marked, verified correct patient position, special equipment/implants available, medications/allergies/relevant history reviewed, required imaging and test results available.   Sterile Technique Maximal sterile technique including full sterile barrier drape, hand hygiene, sterile gown, sterile gloves, mask, hair covering, sterile ultrasound probe cover (if used).   Procedure Description Area of catheter insertion was cleaned with chlorhexidine and draped in sterile fashion. With real-time ultrasound guidance an arterial catheter was placed into the left femoral artery.  Appropriate arterial tracings confirmed on monitor.     Complications/Tolerance None; patient tolerated the procedure well.   EBL Minimal   Specimen(s) None

## 2022-01-14 NOTE — Evaluation (Signed)
Clinical/Bedside Swallow Evaluation Patient Details  Name: Jody Nguyen MRN: [id number] Date of Birth: 1956-05-24  Today's Date: 01/14/2022 Time: SLP Start Time (ACUTE ONLY): 0941 SLP Stop Time (ACUTE ONLY): 1001 SLP Time Calculation (min) (ACUTE ONLY): 20 min  Past Medical History:  Past Medical History:  Diagnosis Date   Anemia    Chronic diastolic CHF (congestive heart failure) (HCC)    Chronic diastolic heart failure (HCC)    CKD (chronic kidney disease) stage 4, GFR 15-29 ml/min (HCC)    dialysis M/W/F   Constipation    CVA (cerebral infarction) 1997   no residual deficit   Diverticulitis    DM (diabetes mellitus) (HCC)    type 2   Heart murmur    History of cardiovascular stress test    a. Myoview Oct 2012 showed EF 49%, no ischemia, LVE   HLD (hyperlipidemia)    takes Crestor daily   Hypertension    Pericardial effusion    chronic; felt to be poss related to minoxidil >> DC'd   Pulmonary hypertension ([place]) 06/18/2015   Respiratory failure, acute hypoxic, post-operative 07/08/2015   Requiring ECMO support   S/P cardiac catheterization    a. R/L [facility] 06/18/15:  mLAD 30%; severe pulmo HTN with PA sat 43%, CI 1.86, prominent V waves indicative of MR; resting hypoxemia O2 sat 86% on RA   S/P minimally invasive mitral valve repair 07/08/2015   Complex valvuloplasty including artificial Gore-tex neochord placement x10 and 26 mm Sorin Memo 3D Rechord ring annuloplasty via right mini thoracotomy approach   Severe mitral regurgitation    Shortness of breath dyspnea    Stroke ([place]) 1997   no residual effect   Vitamin D deficiency    Wears glasses    Past Surgical History:  Past Surgical History:  Procedure Laterality Date   A/V FISTULAGRAM Right 08/16/2017   Procedure: A/V Fistulagram;  Surgeon: Conrad [place], MD;  Location: [place] CV [facility];  Service: Cardiovascular;  Laterality: Right;   AV FISTULA PLACEMENT Left 10/22/2015   Procedure: RADIOCEPHALIC ARTERIOVENOUS (AV)  FISTULA CREATION;  Surgeon: Serafina Mitchell, MD;  Location: [place] OR;  Service: Vascular;  Laterality: Left;   AV FISTULA PLACEMENT Right 05/22/2017   Procedure: ARTERIOVENOUS (AV) FISTULA CREATION-RIGHT ARM;  Surgeon: Waynetta Sandy, MD;  Location: [place];  Service: Vascular;  Laterality: Right;   CANNULATION FOR CARDIOPULMONARY BYPASS N/A 07/08/2015   Procedure: CANNULATION FOR ECMO;  Surgeon: Rexene Alberts, MD;  Location: [place];  Service: Open Heart Surgery;  Laterality: N/A;   CARDIAC CATHETERIZATION     CARDIAC CATHETERIZATION N/A 06/18/2015   Procedure: Right/Left Heart Cath and Coronary Angiography;  Surgeon: Jettie Booze, MD;  Location: [place] CV LAB;  Service: Cardiovascular;  Laterality: N/A;   CESAREAN SECTION     x 2   COLONOSCOPY     CORONARY ARTERY BYPASS GRAFT N/A 12/30/2021   Procedure: CORONARY ARTERY BYPASS GRAFTING (CABG) TIMES THREE ON CARDIOPULMONARY BYPASS. LIMA TO LAD, SVG TO PD, SVG TO DIAG;  Surgeon: [name], MD;  Location: [place];  Service: Open Heart Surgery;  Laterality: N/A;   ENDOVEIN HARVEST OF GREATER SAPHENOUS VEIN Right 01/04/2022   Procedure: ENDOVEIN HARVEST OF GREATER SAPHENOUS VEIN;  Surgeon: [name], MD;  Location: [place];  Service: Open Heart Surgery;  Laterality: Right;  RIGHT UPPER AND LOWER LEG   EXCHANGE OF A DIALYSIS CATHETER N/A 06/28/2017   Procedure: EXCHANGE OF A DIALYSIS CATHETER;  Surgeon: Waynetta Sandy, MD;  Location: [place];  Service: Vascular;  Laterality: N/A;   FISTULA SUPERFICIALIZATION Left 12/28/2015   Procedure: SUPERFICIALIZATION LEFT RADIOCEPHALIC FISTULA;  Surgeon: Serafina Mitchell, MD;  Location: [place];  Service: Vascular;  Laterality: Left;   FISTULA SUPERFICIALIZATION Right 08/21/2017   Procedure: FISTULA SUPERFICIALIZATION RIGHT ARM;  Surgeon: Waynetta Sandy, MD;  Location: [place];  Service: Vascular;  Laterality: Right;   INSERTION OF DIALYSIS CATHETER Right 04/28/2017    Procedure: INSERTION OF DIALYSIS CATHETER-RIGHT INTERNAL JUGULAR PLACEMENT;  Surgeon: Elam Dutch, MD;  Location: [place];  Service: Vascular;  Laterality: Right;   LEFT HEART CATH AND CORONARY ANGIOGRAPHY N/A 01/24/2022   Procedure: LEFT HEART CATH AND CORONARY ANGIOGRAPHY;  Surgeon: Troy Sine, MD;  Location: [place] [facility];  Service: Cardiovascular;  Laterality: N/A;   LIGATION OF ARTERIOVENOUS  FISTULA Left 04/28/2017   Procedure: LIGATION OF ARTERIOVENOUS  FISTULA;  Surgeon: Elam Dutch, MD;  Location: [place];  Service: Vascular;  Laterality: Left;   MITRAL VALVE REPAIR Right 07/08/2015   Procedure: MINIMALLY INVASIVE MITRAL VALVE REPAIR with a 26 Sorin Memo 3D Rechord;  Surgeon: Rexene Alberts, MD;  Location: [place];  Service: Open Heart Surgery;  Laterality: Right;   MULTIPLE EXTRACTIONS WITH ALVEOLOPLASTY N/A 06/30/2015   Procedure: MULTIPLE EXTRACTIONS OF TOOTH #'S 4  AND 30 WITH ALVEOLOPLASTY AND GROSS DEBRIDEMENT  OF REMAINING TEETH;  Surgeon: Lenn Cal, DDS;  Location: [place];  Service: Oral Surgery;  Laterality: N/A;   PERIPHERAL VASCULAR CATHETERIZATION Left 03/28/2016   Procedure: A/V Fistulagram;  Surgeon: Serafina Mitchell, MD;  Location: [place] CV [facility];  Service: Cardiovascular;  Laterality: Left;  lower arm   REPAIR OF PATENT FORAMEN OVALE N/A 01/24/2022   Procedure: REPAIR OF PATENT FORAMEN OVALE;  Surgeon: [name], MD;  Location: [place];  Service: Open Heart Surgery;  Laterality: N/A;   TEE WITHOUT CARDIOVERSION N/A 06/08/2015   Procedure: TRANSESOPHAGEAL ECHOCARDIOGRAM (TEE);  Surgeon: Lelon Perla, MD;  Location: [facility]OS[facility];  Service: Cardiovascular;  Laterality: N/A;   TEE WITHOUT CARDIOVERSION N/A 07/08/2015   Procedure: TRANSESOPHAGEAL ECHOCARDIOGRAM (TEE);  Surgeon: Rexene Alberts, MD;  Location: [place];  Service: Open Heart Surgery;  Laterality: N/A;   TEE WITHOUT CARDIOVERSION N/A 12/30/2021   Procedure: TRANSESOPHAGEAL ECHOCARDIOGRAM  (TEE);  Surgeon: [name], MD;  Location: [place];  Service: Open Heart Surgery;  Laterality: N/A;   TRICUSPID VALVE REPLACEMENT N/A 01/08/2022   Procedure: TRICUSPID VALVE REPAIR USING AN 36MM EDWARDS MC3 TRICUSPID ANNULOPLASTY RING;  Surgeon: [name], MD;  Location: [place];  Service: Open Heart Surgery;  Laterality: N/A;   HPI:  66 yo F presented to [facility] ED 2/4 with chest pain. ECHO revealed severe RV failure, felt to be  consistent with chronic process such as severe pulmonary HTN. 2/7 cardiac cath - three vessel dz;  2/10 CABGx3, TVR; 2/13 chest tube removed; ETT 2/10-2/16; CVVHD.  PMH ESRD on HD, non-obstructive CAD, suspected pulmonary hypertension, Hx PE on eliquis. Passed [place] on 2/16; RN had concerns for potential dysphagia and requested SLP swallow consult.    Assessment / Plan / Recommendation  Clinical Impression  Pt participated in limited clinical swallow assessment - given permission to proceed per Dr. Verlee Monte prior to procedure today.  She demonstrates weak voice/cough, concerns for compromised airway protection with thin liquids (consistent throat-clearing and cough), improved performance with honey-thick liquids and applesauce.  VS remained stable throughout exam.  Positioning was less than optimal -  limited to 30 degrees due to femoral line.  She is a higher risk for aspiration due to prolonged oral intubation, weak cough, and deconditioned state. Recommend continuing cortrak for primary nutrition, allowing ice chips, sips of honey-thick liquids and bites of applesauce from floor stock for pleasure.  SLP will follow for safety/ potential diet advancement and to evaluate necessity of bedside FEES.  D/W RN. SLP Visit Diagnosis: Dysphagia, pharyngeal phase (R13.13)    Aspiration Risk  Moderate aspiration risk    Diet Recommendation   Ice chips; honey-thick liquids and purees from floor stock for pleasure. Primary nutrition via cortrak       Other   Recommendations Oral Care Recommendations: Oral care BID Other Recommendations: Order thickener from pharmacy    Recommendations for follow up therapy are one component of a multi-disciplinary discharge planning process, led by the attending physician.  Recommendations may be updated based on patient status, additional functional criteria and insurance authorization.  Follow up Recommendations Other (comment) (tba)      Assistance Recommended at Discharge Frequent or constant Supervision/Assistance  Functional Status Assessment    Frequency and Duration min 2x/week  2 weeks       Prognosis Prognosis for Safe Diet Advancement: Good      Swallow Study   General Date of Onset: 01/21/2022 HPI: 66 yo F presented to [facility] ED 2/4 with chest pain. ECHO revealed severe RV failure, felt to be  consistent with chronic process such as severe pulmonary HTN. 2/7 cardiac cath - three vessel dz;  2/10 CABGx3, TVR; 2/13 chest tube removed; ETT 2/10-2/16; CVVHD.  PMH ESRD on HD, non-obstructive CAD, suspected pulmonary hypertension, Hx PE on eliquis. Passed [place] on 2/16; RN had concerns for potential dysphagia and requested SLP swallow consult. Type of Study: Bedside Swallow Evaluation Previous Swallow Assessment: no Diet Prior to this Study: NPO Temperature Spikes Noted: No Respiratory Status: Nasal cannula (6L) History of Recent Intubation: Yes Length of Intubations (days): 6 days Date extubated: 01/12/22 Behavior/Cognition: Alert Oral Cavity Assessment: Within Functional Limits Oral Care Completed by SLP: Recent completion by staff Oral Cavity - Dentition: Adequate natural dentition Self-Feeding Abilities: Total assist Patient Positioning: Partially reclined (HOB restrictions secondary to femoral line) Baseline Vocal Quality: Low vocal intensity Volitional Cough: Weak Volitional Swallow: Able to elicit    Oral/Motor/Sensory Function Overall Oral Motor/Sensory Function: Within  functional limits   Ice Chips Ice chips: Within functional limits   Thin Liquid Thin Liquid: Impaired Presentation: Straw Pharyngeal  Phase Impairments: Multiple swallows;Throat Clearing - Immediate;Cough - Immediate    Nectar Thick Nectar Thick Liquid: Not tested   Honey Thick Honey Thick Liquid: Impaired Presentation: Straw Pharyngeal Phase Impairments: Multiple swallows   Puree Puree: Within functional limits   Solid     Solid: Not tested     Estill Bamberg L. Tivis Ringer, [place] Office number [phone] Pager [phone]  [name] 01/14/2022,10:09 AM

## 2022-01-14 NOTE — Progress Notes (Signed)
PHARMACY - PHYSICIAN COMMUNICATION CRITICAL VALUE ALERT - BLOOD CULTURE IDENTIFICATION (BCID)  Jody Nguyen is an 66 y.o. female who presented to [facility] on 12/29/2021 with a chief complaint of chest pain during dialysis. S/p CABG x4, new onset leukocytosis.  Assessment: Blood cultures positive 2/4 for GNR (Enterbacterales-no spp)   Name of physician (or Provider) Contacted: Dr. Verlee Monte  Current antibiotics: Vancomycin/zosyn  Changes to prescribed antibiotics recommended:  Discontinue vancomycin Continue zosyn  F/u cultures for further speciation  Results for orders placed or performed during the hospital encounter of 01/07/2022  Blood Culture ID Panel (Reflexed) (Collected: 01/13/2022 11:38 AM)  Result Value Ref Range   Enterococcus faecalis NOT DETECTED NOT DETECTED   Enterococcus Faecium NOT DETECTED NOT DETECTED   Listeria monocytogenes NOT DETECTED NOT DETECTED   Staphylococcus species NOT DETECTED NOT DETECTED   Staphylococcus aureus (BCID) NOT DETECTED NOT DETECTED   Staphylococcus epidermidis NOT DETECTED NOT DETECTED   Staphylococcus lugdunensis NOT DETECTED NOT DETECTED   Streptococcus species NOT DETECTED NOT DETECTED   Streptococcus agalactiae NOT DETECTED NOT DETECTED   Streptococcus pneumoniae NOT DETECTED NOT DETECTED   Streptococcus pyogenes NOT DETECTED NOT DETECTED   A.calcoaceticus-baumannii NOT DETECTED NOT DETECTED   Bacteroides fragilis NOT DETECTED NOT DETECTED   Enterobacterales DETECTED (A) NOT DETECTED   Enterobacter cloacae complex NOT DETECTED NOT DETECTED   Escherichia coli NOT DETECTED NOT DETECTED   Klebsiella aerogenes NOT DETECTED NOT DETECTED   Klebsiella oxytoca NOT DETECTED NOT DETECTED   Klebsiella pneumoniae NOT DETECTED NOT DETECTED   Proteus species NOT DETECTED NOT DETECTED   Salmonella species NOT DETECTED NOT DETECTED   Serratia marcescens NOT DETECTED NOT DETECTED   Haemophilus influenzae NOT DETECTED NOT DETECTED   Neisseria  meningitidis NOT DETECTED NOT DETECTED   Pseudomonas aeruginosa NOT DETECTED NOT DETECTED   Stenotrophomonas maltophilia NOT DETECTED NOT DETECTED   Candida albicans NOT DETECTED NOT DETECTED   Candida auris NOT DETECTED NOT DETECTED   Candida glabrata NOT DETECTED NOT DETECTED   Candida krusei NOT DETECTED NOT DETECTED   Candida parapsilosis NOT DETECTED NOT DETECTED   Candida tropicalis NOT DETECTED NOT DETECTED   Cryptococcus neoformans/gattii NOT DETECTED NOT DETECTED   CTX-M ESBL NOT DETECTED NOT DETECTED   Carbapenem resistance IMP NOT DETECTED NOT DETECTED   Carbapenem resistance KPC NOT DETECTED NOT DETECTED   Carbapenem resistance NDM NOT DETECTED NOT DETECTED   Carbapenem resist OXA 48 LIKE NOT DETECTED NOT DETECTED   Carbapenem resistance VIM NOT DETECTED NOT DETECTED   Thank you for allowing pharmacy to be a part of this patients care.  Donnald Garre, PharmD Clinical Pharmacist  Please check AMION for all [place] numbers After 10:00 PM, call [place] [phone]

## 2022-01-14 NOTE — Progress Notes (Addendum)
[place] for bivalirudin Indication: history of pulmonary embolus 2021  Allergies  Allergen Reactions   Minoxidil Other (See Comments)    Pericardial effusion   Lipitor [Atorvastatin] Other (See Comments)    MYALGIAS > "pain in legs" Tolerates rosuvastatin   Morphine And Related Itching   Ace Inhibitors Other (See Comments)    REACTION: "not sure...think it made me drowsy all the time"    Patient Measurements: Height: 4' 11.5" (151.1 cm) Weight: 69.9 kg (154 lb 1.6 oz) IBW/kg (Calculated) : 44.35  Vital Signs: Temp: 99.7 F (37.6 C) (02/18 0545) Temp Source: Axillary (02/18 0545) Pulse Rate: 118 (02/18 0645)  Labs: Recent Labs    01/12/22 1000 01/12/22 1557 01/12/22 1822 01/13/22 0411 01/13/22 1229 01/13/22 1506 01/13/22 1630 01/13/22 2358 01/14/22 0446 01/14/22 0458  HGB  --   --    < > 8.7*   < >  --    < > 8.8* 10.2* 9.0*  HCT  --   --    < > 27.1*   < >  --    < > 28.4* 30.0* 28.0*  PLT  --   --   --  90*  --   --   --  91*  --  93*  APTT 77*  --   --  72*  --   --   --   --   --  67*  CREATININE  --  1.32*  --  1.38*  --  1.50*  --   --   --   --    < > = values in this interval not displayed.     Estimated Creatinine Clearance: 31.8 mL/min (A) (by C-G formula based on SCr of 1.5 mg/dL (H)).   Assessment: 66 yo female on chronic apixaban for hx of PE 2021  Apixaban has been held for surgery.  Pharmacy asked to resume anticoagulation 2/15 with bivalirudin.  Platelet count rose to the 90s and remains stable.  HIT antibody and SRA both negative.  Patient having oozing from groin after new central line placement yesterday evening. Will decrease rate slightly with continued oozing.   Bivalirudin drip rate 0.02 mg/kg/hr; APTT 67 seconds. Hgb low but stable 8-9 Pacing wires pulled 2/15 Currently on CRRT   Goal of Therapy:  aPTT goal ~50-70sec - target lower end given low platelet count/risk of post-operative  bleeding Monitor platelets by anticoagulation protocol: Yes   Plan:  Decrease bivalirudin to 0.015 mg/kg/hr . aPTT in 4 hours with rate change Daily APTT and CBC  Cathrine Muster, PharmD PGY2 Cardiology Pharmacy Resident Phone: [phone] 01/14/2022  7:19 AM  Please check [email or web address] for unit-specific pharmacy phone numbers.  [facility] pharmacy phone numbers are listed on [email or web address]  Addendum:  Holding bivalirudin x 1 hour for line placements. Will retime level after bivalirudin resumed.

## 2022-01-14 NOTE — Procedures (Signed)
Central Venous Catheter Insertion Procedure Note  Jody Nguyen  [id number]  [birth year]  Date:01/14/22  Time:9:15 PM   Provider Performing:Michaeleen Down Jerilynn Mages Verlee Monte   Procedure: Insertion of Non-tunneled Central Venous [phone]) with US guidance ([id number])   Indication(s) Medication administration and Difficult access  Consent Risks of the procedure as well as the alternatives and risks of each were explained to the patient and/or caregiver.  Consent for the procedure was obtained and is signed in the bedside chart  Anesthesia Topical only with 1% lidocaine   Timeout Verified patient identification, verified procedure, site/side was marked, verified correct patient position, special equipment/implants available, medications/allergies/relevant history reviewed, required imaging and test results available.  Sterile Technique Maximal sterile technique including full sterile barrier drape, hand hygiene, sterile gown, sterile gloves, mask, hair covering, sterile ultrasound probe cover (if used).  Procedure Description Area of catheter insertion was cleaned with chlorhexidine and draped in sterile fashion.  With real-time ultrasound guidance a central venous catheter was placed into the left femoral vein. Nonpulsatile blood flow and easy flushing noted in all ports.  The catheter was sutured in place and sterile dressing applied.  Complications/Tolerance None; patient tolerated the procedure well. Chest X-ray is ordered to verify placement for internal jugular or subclavian cannulation.   Chest x-ray is not ordered for femoral cannulation.  EBL Minimal  Specimen(s) None

## 2022-01-14 NOTE — Procedures (Signed)
Central Venous Catheter Insertion Procedure Note  Jody Nguyen  [id number]  10-04-1956  Date:01/14/22  Time:9:18 PM   Provider Performing:Jannelly Bergren Jerilynn Mages Verlee Monte   Procedure: Insertion of Non-tunneled Central Venous [phone]) with US guidance ([id number])   Indication(s) Hemodialysis  Consent Risks of the procedure as well as the alternatives and risks of each were explained to the patient and/or caregiver.  Consent for the procedure was obtained and is signed in the bedside chart  Anesthesia Topical only with 1% lidocaine   Timeout Verified patient identification, verified procedure, site/side was marked, verified correct patient position, special equipment/implants available, medications/allergies/relevant history reviewed, required imaging and test results available.  Sterile Technique Maximal sterile technique including full sterile barrier drape, hand hygiene, sterile gown, sterile gloves, mask, hair covering, sterile ultrasound probe cover (if used).  Procedure Description Area of catheter insertion was cleaned with chlorhexidine and draped in sterile fashion.  With real-time ultrasound guidance a HD catheter was placed into the right femoral vein. Nonpulsatile blood flow and easy flushing noted in all ports.  The catheter was sutured in place and sterile dressing applied.  Complications/Tolerance None; patient tolerated the procedure well. Chest X-ray is ordered to verify placement for internal jugular or subclavian cannulation.   Chest x-ray is not ordered for femoral cannulation.  EBL Minimal  Specimen(s) None

## 2022-01-14 NOTE — Progress Notes (Addendum)
[place] for bivalirudin Indication: history of pulmonary embolus 2021  Allergies  Allergen Reactions   Minoxidil Other (See Comments)    Pericardial effusion   Lipitor [Atorvastatin] Other (See Comments)    MYALGIAS > "pain in legs" Tolerates rosuvastatin   Morphine And Related Itching   Ace Inhibitors Other (See Comments)    REACTION: "not sure...think it made me drowsy all the time"    Patient Measurements: Height: 4' 11.5" (151.1 cm) Weight: 69.9 kg (154 lb 1.6 oz) IBW/kg (Calculated) : 44.35  Vital Signs: Temp: 98.4 F (36.9 C) (02/18 1500) Pulse Rate: 104 (02/18 1930)  Labs: Recent Labs    01/12/22 1000 01/12/22 1557 01/13/22 0411 01/13/22 1229 01/13/22 1506 01/13/22 1630 01/13/22 2358 01/14/22 0446 01/14/22 0458 01/14/22 0729 01/14/22 1600  HGB  --    < > 8.7*   < >  --    < > 8.8* 10.2* 9.0*  --   --   HCT  --    < > 27.1*   < >  --    < > 28.4* 30.0* 28.0*  --   --   PLT  --   --  90*  --   --   --  91*  --  93*  --   --   APTT 77*  --  72*  --   --   --   --   --  67*  --   --   CREATININE  --    < > 1.38*  --  1.50*  --   --   --   --  1.56* 1.89*   < > = values in this interval not displayed.     Estimated Creatinine Clearance: 25.2 mL/min (A) (by C-G formula based on SCr of 1.89 mg/dL (H)).   Assessment: 66 yo female on chronic apixaban for hx of PE 2021  Apixaban has been held for surgery.  Pharmacy asked to resume anticoagulation 2/15 with bivalirudin.  Platelet count rose to the 90s and remains stable.  HIT antibody and SRA both negative.  Patient having oozing from groin after new central line placement yesterday evening.  -bivalirudin held for line placements and bleeding to restart at 9pm   Goal of Therapy:  aPTT goal ~50-70sec - target lower end given low platelet count/risk of post-operative bleeding Monitor platelets by anticoagulation protocol: Yes   Plan:  -restart bival at 0.0135  mg/kg/hr -aPTT in 4 hrs  Hildred Laser, PharmD Clinical Pharmacist **Pharmacist phone directory can now be found on [email or web address] ([email or web address]).  Listed under [place].

## 2022-01-14 NOTE — Progress Notes (Signed)
8 Days Post-Op Procedure(s) (LRB): CORONARY ARTERY BYPASS GRAFTING (CABG) TIMES THREE ON CARDIOPULMONARY BYPASS. LIMA TO LAD, SVG TO PD, SVG TO DIAG (N/A) TRICUSPID VALVE REPAIR USING AN 36MM EDWARDS MC3 TRICUSPID ANNULOPLASTY RING (N/A) TRANSESOPHAGEAL ECHOCARDIOGRAM (TEE) (N/A) APPLICATION OF CELL SAVER (N/A) ENDOVEIN HARVEST OF GREATER SAPHENOUS VEIN (Right) REPAIR OF PATENT FORAMEN OVALE (N/A) Subjective: Alert, some discomfort  Objective: Vital signs in last 24 hours: Temp:  [97.5 F (36.4 C)-99.7 F (37.6 C)] 97.5 F (36.4 C) (02/18 0800) Pulse Rate:  [108-123] 118 (02/18 0645) Cardiac Rhythm: Sinus tachycardia (02/18 0800) Resp:  [9-27] 16 (02/18 0700) BP: (124-132)/(66-76) 132/76 (02/17 1845) SpO2:  [89 %-100 %] 100 % (02/18 0345) Arterial Line BP: (88-136)/(48-79) 114/68 (02/18 0700) FiO2 (%):  [50 %-80 %] 80 % (02/18 0345) Weight:  [69.9 kg] 69.9 kg (02/18 0600)  Hemodynamic parameters for last 24 hours: CVP:  [0 mmHg-38 mmHg] 27 mmHg  Intake/Output from previous day: 02/17 0701 - 02/18 0700 In: 2209.7 [I.V.:970.6; Blood:315; NG/GT:500; IV Piggyback:424.1] Out: 2444 [Stool:300] Intake/Output this shift: Total I/O In: 84.4 [I.V.:26.9; NG/GT:45; IV Piggyback:12.5] Out: 127 [Other:127]  General appearance: alert, cooperative, and no distress Neurologic: intact Heart: tachy, regular Lungs: clear to auscultation bilaterally Abdomen: normal findings: soft, non-tender Wound: clean and dry  Lab Results: Recent Labs    01/13/22 2358 01/14/22 0446 01/14/22 0458  WBC 25.9*  --  25.9*  HGB 8.8* 10.2* 9.0*  HCT 28.4* 30.0* 28.0*  PLT 91*  --  93*   BMET:  Recent Labs    01/13/22 1506 01/13/22 1630 01/14/22 0446 01/14/22 0729  NA 138   < > 135 137  K 4.4   < > 5.1 5.1  CL 105  --   --  103  CO2 24  --   --  24  GLUCOSE 131*  --   --  244*  BUN 17  --   --  22  CREATININE 1.50*  --   --  1.56*  CALCIUM 9.0  --   --  9.2   < > = values in this interval  not displayed.    PT/INR: No results for input(s): LABPROT, INR in the last 72 hours. ABG    Component Value Date/Time   PHART 7.428 01/14/2022 0446   HCO3 26.6 01/14/2022 0446   TCO2 28 01/14/2022 0446   ACIDBASEDEF 8.0 (H) 01/07/2022 0648   O2SAT 66.9 01/14/2022 0458   CBG (last 3)  Recent Labs    01/13/22 2353 01/14/22 0443 01/14/22 0756  GLUCAP 172* 229* 217*    Assessment/Plan: S/P Procedure(s) (LRB): CORONARY ARTERY BYPASS GRAFTING (CABG) TIMES THREE ON CARDIOPULMONARY BYPASS. LIMA TO LAD, SVG TO PD, SVG TO DIAG (N/A) TRICUSPID VALVE REPAIR USING AN 36MM EDWARDS MC3 TRICUSPID ANNULOPLASTY RING (N/A) TRANSESOPHAGEAL ECHOCARDIOGRAM (TEE) (N/A) APPLICATION OF CELL SAVER (N/A) ENDOVEIN HARVEST OF GREATER SAPHENOUS VEIN (Right) REPAIR OF PATENT FORAMEN OVALE (N/A) Looks better this AM NEURO- intact CV- sinus tachy  Norepi down to 16, epi down to 6  Off vasopressin RESP- NRB overnight, on [place] this AM RENAL- on CVVHD ENDO- CBG elevated, on hydrocortisone IV Gi- tolerating Tf  Check swallow eval ID- WBC still elevated, no definite source (steroids, lines)  Day 2 Vanco and Zosyn PLT count stable, on Bivalrudin   LOS: 13 days    [name] 01/14/2022

## 2022-01-14 NOTE — Progress Notes (Signed)
° °   °  [address]       [place] [address]             [phone]      Getting lines changed now  BP 132/76    Pulse (!) 112    Temp 98.4 F (36.9 C)    Resp 18    Ht 4' 11.5" (1.511 m)    Wt 69.9 kg    SpO2 100%    BMI 30.60 kg/m  Epi @ 5 norepi @ 14  Intake/Output Summary (Last 24 hours) at 01/14/2022 1753 Last data filed at 01/14/2022 1723 Gross per 24 hour  Intake 1970.27 ml  Output 1903 ml  Net 67.27 ml   K= 5.2, Albumin 2.7  Continue current Rx  Janith Nielson C. Roxan Hockey, MD [facility] [phone]

## 2022-01-14 NOTE — Progress Notes (Signed)
[facility] Progress Note  Name: Jody Nguyen MRN: [id number] DOB: 1956/03/30   Subjective:  Patient seen and examined on CRRT.  Central line exchanged-rt fem now, having some oozing per RN Unable to obtain IJ access Remains on levo/epi She does endorse some SOB, has been running on CRRT w/o issues--net even.    Intake/Output Summary (Last 24 hours) at 01/14/2022 0805 Last data filed at 01/14/2022 0700 Gross per 24 hour  Intake 2139.28 ml  Output 2352 ml  Net -212.72 ml    Vitals:  Vitals:   01/14/22 0615 01/14/22 0630 01/14/22 0645 01/14/22 0700  BP:      Pulse:  (!) 121 (!) 118   Resp: 15 15 (!) 21 16  Temp:    (!) 97.5 F (36.4 C)  TempSrc:      SpO2:      Weight:      Height:         Physical Exam:  General: tired appearing, NAD Lungs: slightly inc'd WOB Heart S1S2  Abdomen soft obese Extremities: generalized edema Neuro : awake, tired Access: right arm AVF in place, no bruit or thrill/swollen; left fem temp HD line c/d/i  Medications reviewed   Labs:  BMP Latest Ref Rng & Units 01/14/2022 01/13/2022 01/13/2022  Glucose 70 - 99 mg/dL - - -  BUN 8 - 23 mg/dL - - -  Creatinine 0.44 - 1.00 mg/dL - - -  BUN/Creat Ratio 6 - 22 (calc) - - -  Sodium 135 - 145 mmol/L 135 137 138  Potassium 3.5 - 5.1 mmol/L 5.1 4.9 4.6  Chloride 98 - 111 mmol/L - - -  CO2 22 - 32 mmol/L - - -  Calcium 8.9 - 10.3 mg/dL - - -   Dialysis Orders: MWF at [facility] 3:15hr, 450/A1.5, EDW 75.5kg, 2K/2Ca, UFP #2, AVF, heparin 2500 unit bolus - Hectoral 71mcg IV q HD - Sensipar 90mg  PO q HD - Mircera 110mcg IV q 2 weeks (ordered, not yet given. Last Mircera 100 on 12/07/21)   Assessment/Plan:   CAD s/p CABG x3, TR sp TVR:  per CT surgery   ESRD:  normally MWF schedule, last IHD Thursday 2/09. Remains on pressors x 3. CRRT started 2/11, remains critically ill. Cont CRRT for now, plan to transition to IHD as she comes off pressors. On angiomax Infiltrated AVF - AVF  infiltrated on Tuesday 2/7. Expert cannulator able to stick 2/09 and completed full treatment. Not functional, VVS following. Planning for [facility] possibly next week provided she remains stable, appreciate assistance from VVS Hypotension - down to 2 pressors. Pressor support per primary service Leukocytosis-per pmd, on broad spectrum abx Thrombycytopenia-HIT/SRA neg. likely related to crrt. per PMD, on angiomax Volume: hx of HTN now in shock post-op. Currently running net even, inc to net neg 50cc/hr to start with, may need to prioritize UF today, discussed w/ RN  Anemia CKD : s/p PRBC's , Hb 9, transfuse prn  Metabolic bone disease: resume home binders and sensipar when taking PO. Cont IV vdra.   Pulm HTN:  Followed at [facility], on Revatio and opsumit  T2DM-per primary service  Hx of MV replacement - in 2016  Gean Quint, MD 01/14/2022 8:05 AM

## 2022-01-14 NOTE — Progress Notes (Signed)
NAME:  Jody Nguyen, MRN:  [id number], DOB:  1956-01-01, LOS: 34 ADMISSION DATE:  01/01/2022, CONSULTATION DATE:  01/21/2022 REFERRING MD:  Martie Lee, TRH, CHIEF COMPLAINT:  SOB   History of Present Illness:  66 yo F PMH ESRD, non-obstructive CAD, suspected pulmonary hypertension, Hx PE on eliquis who presented to [facility] ED 2/4 with chest pain, which radiates to arm. Similar sx had also promoted ED presentation on 2/1 at which time trops were 100. She was seen by cardiology outpt on 2/2 and chest pain was felt to be related to volume shift as pain had occurred during dialysis.   On 2/4 presentation, Pt states that episodic chest pain has been intermittent for the past week. Non-exertional, lasts approx 10 minutes. Associated nausea and SOB. Non-specific EKG changes  & was d/w cardiology, ruled out for STEMI. Trop 200. Placed on [place] for SpO2 80s.Admitted to [facility] for obvs with concern for PE and started on heparin gtt. Awaiting VQ scan. ECHO has been performed which reveals Severe RV RA enlargement, severely reduced RV function and septal bowing, felt consistent with chronic process such as severe pulmonary HTN.    PCCM has been consulted in this setting. She remains critically ill.   Pertinent  Medical History  ESRD PE Pulmonary HTN  CAD CVA HLD HTN S/p MV repair  Pericardial effusion r/t minoxidil  Diastolic HF DM2  Significant Hospital Events: Including procedures, antibiotic start and stop dates in addition to other pertinent events   2/4 ED presentation with recurring chest pain -- STEMI ruled out. Stable, mildly hypoxic and placed on [place] 2/5 admit to [facility] for obvs. Concern for PE, ECHO performed by cardiology reportedly shows RV failure, but felt more consistent with chronic process like severe pulmHTN. Awaiting VQ scan. Pulm consulted  2/7 cardiac cath. 3 vessel dz. CABG rec 2/10 CABG, TVR 2/11 continued vasoplegic shock with very high vasopressor requirements  2/12 Given methylene blue  x 2 2/13 chest tube removed 2/14 JP removed 2/15 pacing wires removed 2/16 milrinone stopped, extubated  Interim History / Subjective:  GNR but now growth yet on BCX, BCID panel with enterobacterales. Vanc dc'd. Pressor requirement down a bit today after 1U pRBC and antibiotic initiation.  Objective   Blood pressure 132/76, pulse (!) 108, temperature (!) 97.5 F (36.4 C), resp. rate 18, height 4' 11.5" (1.511 m), weight 69.9 kg, SpO2 100 %. CVP:  [0 mmHg-29 mmHg] 16 mmHg  Vent Mode: BIPAP FiO2 (%):  [50 %-80 %] 80 % Set Rate:  [14 bmp] 14 bmp PEEP:  [5 cmH20] 5 cmH20   Intake/Output Summary (Last 24 hours) at 01/14/2022 1116 Last data filed at 01/14/2022 1000 Gross per 24 hour  Intake 2215.92 ml  Output 2443 ml  Net -227.08 ml   Filed Weights   01/12/22 0500 01/13/22 0500 01/14/22 0600  Weight: 73.2 kg 74 kg 69.9 kg    Examination: General appearance: 66 y.o., female, drowsy but arousable Eyes: PERRL, tracking appropriately HENT: NCAT; dry MM Neck: hematoma over left supraclavicular area Lungs: diminished bl, with normal respiratory effort CV: tachy, RR, no murmur, mid line sternotomy cdi Abdomen: Soft, non-tender; non-distended, BS present  Extremities: 1+ peripheral edema, warm, AVF bl without bruit, swollen/ecchymotic Neuro: grossly no focal deficit    Ancillary tests personally reviewed:    WBC stable   Assessment & Plan:  Coronary artery disease: 3 vessel disease diagnosed on cardiac cath 2/7.  CABG x3 on 2/10 LIMA to LAD, SVG to PD, SVG  to DIAG S/p CABG 2/10. Tricuspid regurgitation, severe: S/P TVR 2/10 tricuspid annuloplasty ring in place HX S/p MVR 2016 Acute on chronic systolic/diastolic CHF Mixed cardiogenic and septic shock:  -TCTS following appreciate assistance and management -f/u BCx, continue zosyn -Goal MAP 60-65. Continue epi, wean levo first to goal map -Replace lines -Continue CVVHD as below -Continue Solucortef 50 BID, tapering  Acute  respiratory failure with hypoxia  Combined pre and post capillary PH  -Was on revatio and opsumit. Will reassess over course of hospitalization but limited evidence base in patients with valve related post capillary PH after repair/replacement.   ESRD on HD Thrombosed AVF Neph,vascular suspect clotted AVF -Neph following appreciate assistance  -Will address thrombosed AVF once no longer critically ill. -Will need TDC when more stable -Continue CRRT. Goal UF -50/hr as tolerated  HLD DM2 -Blood Glucose goal 140-180. -SSI -Continue levemir to 15 u  Acute blood loss anemia Suspect secondary to surgery, frequent CRRT circuit changes, and chronic kidney disease -Transfuse PRBC if HBG less than 7 -Obtain AM CBC to trend H&H -Monitor for signs of bleeding  Hx PE - ?CTEPH Thrombocytopenia - HITT vs CPB related. PLT 123>52>52>48>59. HITT panel 0.075. SRA negative. -Despite negative HIT workup will continue bivalirudin for ease of titration within therapeutic range -Was on [facility] previously for PE   Best Practice (right click and "Reselect all SmartList Selections" daily)   Diet/type: tubefeeds   DVT prophylaxis: other- on bivalirudin GI prophylaxis: PPI Lines: Central line, Dialysis Catheter, Arterial Line, and yes and it is still needed - will need to replace TLC, arterial line, vascath today Foley:  N/A Code Status:  full code Last date of multidisciplinary goals of care discussion [Family updated 2/14]   Critical care time: 38 minutes  [place]  01/14/2022 , 11:16 AM  Please see [email or web address] for pager details  If no response, please call [phone] After hours, please call [place] at [phone]

## 2022-01-15 ENCOUNTER — Inpatient Hospital Stay (HOSPITAL_COMMUNITY): Payer: Medicare Other

## 2022-01-15 DIAGNOSIS — I5021 Acute systolic (congestive) heart failure: Secondary | ICD-10-CM | POA: Diagnosis not present

## 2022-01-15 DIAGNOSIS — R579 Shock, unspecified: Secondary | ICD-10-CM | POA: Diagnosis not present

## 2022-01-15 LAB — CBC WITH DIFFERENTIAL/PLATELET
Abs Immature Granulocytes: 0.61 10*3/uL — ABNORMAL HIGH (ref 0.00–0.07)
Basophils Absolute: 0.1 10*3/uL (ref 0.0–0.1)
Basophils Relative: 0 %
Eosinophils Absolute: 0.2 10*3/uL (ref 0.0–0.5)
Eosinophils Relative: 1 %
HCT: 27.4 % — ABNORMAL LOW (ref 36.0–46.0)
Hemoglobin: 8.5 g/dL — ABNORMAL LOW (ref 12.0–15.0)
Immature Granulocytes: 3 %
Lymphocytes Relative: 6 %
Lymphs Abs: 1.4 10*3/uL (ref 0.7–4.0)
MCH: 29.4 pg (ref 26.0–34.0)
MCHC: 31 g/dL (ref 30.0–36.0)
MCV: 94.8 fL (ref 80.0–100.0)
Monocytes Absolute: 1.6 10*3/uL — ABNORMAL HIGH (ref 0.1–1.0)
Monocytes Relative: 7 %
Neutro Abs: 19.8 10*3/uL — ABNORMAL HIGH (ref 1.7–7.7)
Neutrophils Relative %: 83 %
Platelets: 113 10*3/uL — ABNORMAL LOW (ref 150–400)
RBC: 2.89 MIL/uL — ABNORMAL LOW (ref 3.87–5.11)
RDW: 21.7 % — ABNORMAL HIGH (ref 11.5–15.5)
WBC: 23.6 10*3/uL — ABNORMAL HIGH (ref 4.0–10.5)
nRBC: 25.4 % — ABNORMAL HIGH (ref 0.0–0.2)

## 2022-01-15 LAB — POCT I-STAT 7, (LYTES, BLD GAS, ICA,H+H)
Acid-Base Excess: 2 mmol/L (ref 0.0–2.0)
Acid-Base Excess: 4 mmol/L — ABNORMAL HIGH (ref 0.0–2.0)
Bicarbonate: 27.5 mmol/L (ref 20.0–28.0)
Bicarbonate: 28.5 mmol/L — ABNORMAL HIGH (ref 20.0–28.0)
Calcium, Ion: 1.27 mmol/L (ref 1.15–1.40)
Calcium, Ion: 1.33 mmol/L (ref 1.15–1.40)
HCT: 27 % — ABNORMAL LOW (ref 36.0–46.0)
HCT: 29 % — ABNORMAL LOW (ref 36.0–46.0)
Hemoglobin: 9.2 g/dL — ABNORMAL LOW (ref 12.0–15.0)
Hemoglobin: 9.9 g/dL — ABNORMAL LOW (ref 12.0–15.0)
O2 Saturation: 100 %
O2 Saturation: 98 %
Patient temperature: 36.7
Patient temperature: 97.7
Potassium: 4.5 mmol/L (ref 3.5–5.1)
Potassium: 5.2 mmol/L — ABNORMAL HIGH (ref 3.5–5.1)
Sodium: 136 mmol/L (ref 135–145)
Sodium: 136 mmol/L (ref 135–145)
TCO2: 29 mmol/L (ref 22–32)
TCO2: 30 mmol/L (ref 22–32)
pCO2 arterial: 43 mmHg (ref 32–48)
pCO2 arterial: 46.5 mmHg (ref 32–48)
pH, Arterial: 7.378 (ref 7.35–7.45)
pH, Arterial: 7.428 (ref 7.35–7.45)
pO2, Arterial: 192 mmHg — ABNORMAL HIGH (ref 83–108)
pO2, Arterial: 94 mmHg (ref 83–108)

## 2022-01-15 LAB — RENAL FUNCTION PANEL
Albumin: 2.6 g/dL — ABNORMAL LOW (ref 3.5–5.0)
Albumin: 2.6 g/dL — ABNORMAL LOW (ref 3.5–5.0)
Anion gap: 9 (ref 5–15)
Anion gap: 10 (ref 5–15)
BUN: 26 mg/dL — ABNORMAL HIGH (ref 8–23)
BUN: 21 mg/dL (ref 8–23)
CO2: 24 mmol/L (ref 22–32)
CO2: 25 mmol/L (ref 22–32)
Calcium: 8.7 mg/dL — ABNORMAL LOW (ref 8.9–10.3)
Calcium: 9 mg/dL (ref 8.9–10.3)
Chloride: 103 mmol/L (ref 98–111)
Chloride: 101 mmol/L (ref 98–111)
Creatinine, Ser: 1.51 mg/dL — ABNORMAL HIGH (ref 0.44–1.00)
Creatinine, Ser: 1.3 mg/dL — ABNORMAL HIGH (ref 0.44–1.00)
GFR, Estimated: 38 mL/min — ABNORMAL LOW (ref >60)
GFR, Estimated: 45 mL/min — ABNORMAL LOW (ref >60)
Glucose, Bld: 125 mg/dL — ABNORMAL HIGH (ref 70–99)
Glucose, Bld: 74 mg/dL (ref 70–99)
Phosphorus: 3.2 mg/dL (ref 2.5–4.6)
Phosphorus: 2.7 mg/dL (ref 2.5–4.6)
Potassium: 4.9 mmol/L (ref 3.5–5.1)
Potassium: 4.6 mmol/L (ref 3.5–5.1)
Sodium: 136 mmol/L (ref 135–145)
Sodium: 136 mmol/L (ref 135–145)

## 2022-01-15 LAB — ECHOCARDIOGRAM COMPLETE
AR max vel: 1.89 cm2
AV Area VTI: 1.68 cm2
AV Area mean vel: 1.66 cm2
AV Mean grad: 4 mmHg
AV Peak grad: 7.7 mmHg
Ao pk vel: 1.39 m/s
Height: 59.5 in
MV VTI: 0.78 cm2
S' Lateral: 2.8 cm
Weight: 2451.52 oz

## 2022-01-15 LAB — COOXEMETRY PANEL
Carboxyhemoglobin: 1.4 % (ref 0.5–1.5)
Carboxyhemoglobin: 2.1 % — ABNORMAL HIGH (ref 0.5–1.5)
Methemoglobin: 0.7 % (ref 0.0–1.5)
Methemoglobin: <0.7 % (ref 0.0–1.5)
O2 Saturation: 39.6 %
O2 Saturation: 47.9 %
Total hemoglobin: 8.6 g/dL — ABNORMAL LOW (ref 12.0–16.0)
Total hemoglobin: 8.7 g/dL — ABNORMAL LOW (ref 12.0–16.0)

## 2022-01-15 LAB — APTT
aPTT: 58 seconds — ABNORMAL HIGH (ref 24–36)
aPTT: 60 seconds — ABNORMAL HIGH (ref 24–36)

## 2022-01-15 LAB — GLUCOSE, CAPILLARY
Glucose-Capillary: 112 mg/dL — ABNORMAL HIGH (ref 70–99)
Glucose-Capillary: 135 mg/dL — ABNORMAL HIGH (ref 70–99)
Glucose-Capillary: 154 mg/dL — ABNORMAL HIGH (ref 70–99)
Glucose-Capillary: 78 mg/dL (ref 70–99)
Glucose-Capillary: 91 mg/dL (ref 70–99)
Glucose-Capillary: 99 mg/dL (ref 70–99)

## 2022-01-15 LAB — MAGNESIUM: Magnesium: 2.6 mg/dL — ABNORMAL HIGH (ref 1.7–2.4)

## 2022-01-15 MED ORDER — LACTULOSE 10 GM/15ML PO SOLN
30.0000 g | Freq: Three times a day (TID) | ORAL | Status: AC
Start: 2022-01-15 — End: 2022-01-16
  Administered 2022-01-15 – 2022-01-16 (×2): 30 g via ORAL
  Filled 2022-01-15 (×2): qty 45

## 2022-01-15 MED ORDER — DARBEPOETIN ALFA 100 MCG/0.5ML IJ SOSY
100.0000 ug | PREFILLED_SYRINGE | INTRAMUSCULAR | Status: DC
  Filled 2022-01-15: qty 0.5

## 2022-01-15 MED ORDER — MILRINONE LACTATE IN DEXTROSE 20-5 MG/100ML-% IV SOLN
0.1250 ug/kg/min | INTRAVENOUS | Status: DC
  Administered 2022-01-15 – 2022-01-25 (×14): 0.25 ug/kg/min via INTRAVENOUS
  Filled 2022-01-15 (×14): qty 100

## 2022-01-15 MED ORDER — POLYETHYLENE GLYCOL 3350 17 G PO PACK
17.0000 g | PACK | Freq: Two times a day (BID) | ORAL | Status: AC
  Administered 2022-01-15 (×2): 17 g via ORAL
  Filled 2022-01-15 (×2): qty 1

## 2022-01-15 MED ORDER — PIPERACILLIN-TAZOBACTAM 3.375 G IVPB
3.3750 g | Freq: Three times a day (TID) | INTRAVENOUS | Status: DC
  Administered 2022-01-15 – 2022-01-17 (×6): 3.375 g via INTRAVENOUS
  Filled 2022-01-15 (×6): qty 50

## 2022-01-15 NOTE — Progress Notes (Signed)
° °   °  [address]       [place] [address]             [phone]      Had episode of NSVT 24 beats earlier after milrinone started  Currently on norepi @ 20 and epi @ 7  Echo showed severe RV dysfunction  Will try milrinone again  Remo Lipps C. Roxan Hockey, MD [facility] [phone]

## 2022-01-15 NOTE — Progress Notes (Signed)
Pharmacy Antibiotic Note  Jody Nguyen is a 66 y.o. female admitted on 12/30/2021 with  possible sepsis .  Pharmacy has been consulted for and zosyn dosing.  Remains on CRRT. Patient improving on zosyn. Bcx growing morganella. Susceptibilities to follow. Patient received new line 2/18 and now has increased access. Adjust zosyn to extended infusion dosing.   Plan: Zosyn 3.375g IV q q8h extended infusion F/u susceptibilities, clinical course.  Height: 4' 11.5" (151.1 cm) Weight: 69.5 kg (153 lb 3.5 oz) IBW/kg (Calculated) : 44.35  Temp (24hrs), Avg:97.4 F (36.3 C), Min:96.1 F (35.6 C), Max:98.4 F (36.9 C)  Recent Labs  Lab 01/12/22 0211 01/12/22 1557 01/13/22 0411 01/13/22 1506 01/13/22 1646 01/13/22 2358 01/14/22 0458 01/14/22 0729 01/14/22 1600 01/15/22 0401  WBC 22.5*  --  25.8*  --   --  25.9* 25.9*  --   --  23.6*  CREATININE 1.23*   < > 1.38* 1.50*  --   --   --  1.56* 1.89* 1.51*  LATICACIDVEN  --   --   --   --  1.4  --   --   --   --   --    < > = values in this interval not displayed.     Estimated Creatinine Clearance: 31.5 mL/min (A) (by C-G formula based on SCr of 1.51 mg/dL (H)).    Allergies  Allergen Reactions   Minoxidil Other (See Comments)    Pericardial effusion   Lipitor [Atorvastatin] Other (See Comments)    MYALGIAS > "pain in legs" Tolerates rosuvastatin   Morphine And Related Itching   Ace Inhibitors Other (See Comments)    REACTION: "not sure...think it made me drowsy all the time"    Antimicrobials this admission: Vancomycin 2/17 > 2/18 Zosyn 2/17 >  Dose adjustments this admission: Zosyn 3.375 q6h to 3.375 q8h extended infusion (2/19)  Microbiology results: 2/17 BCx x 2 > morganella 2/17 MRSA PCR neg  Thank you for allowing pharmacy to be a part of this patients care.  Cathrine Muster, PharmD PGY2 Cardiology Pharmacy Resident Phone: [phone]  01/15/2022  2:08 PM  Please check [email or web address] for unit-specific pharmacy  phone numbers.    [facility] pharmacy phone numbers are listed on [email or web address]

## 2022-01-15 NOTE — Evaluation (Signed)
Physical Therapy Evaluation Patient Details Name: Jody Nguyen MRN: [id number] DOB: 12/29/1955 Today's Date: 01/15/2022  History of Present Illness  The pt is a 66 yo female presenting 2/5 with recurrent chest pain. Pt now s/p CABG x3 and tricuspid valve annuloplasty on 2/10 requiring 6 units PRBCs, 2 units FFP, and 2 bags of platelets and resulting in vasoplegic shock with very high vasopressor requirements. Started on CRRT 2/11, extubated 2/16. PMH includes: anemia, CHF, CKD IV on HD MWF, CVA, DM II, HLD, HTN, pulmonary HTN.   Clinical Impression  Pt in bed upon arrival of PT, agreeable to evaluation at this time. Prior to admission the pt was independent without need for DME, living in a home with 5-6 steps to enter with her husband. The pt now presents with limitations in functional mobility, strength, ROM, alertness/arousal, and endurance due to above dx, and will continue to benefit from skilled PT to address these deficits. The pt was limited at this time to bed-level evaluation of strength and ROM as transition to sitting EOB is limited by bilateral femoral lines. The pt presents with significant edema in BUE and grossly 3-/5 strength. The pt was able to complete AAROM exercises with hands, wrists, and elbow. The pt was also able to demo partial ROM in BLE for ankle pumps, heel slides, and hip ABD/ADD, and requires assistance to complete all movements. Further assessment limited by inconsistent command following and difficulty maintaining alertness through the session. The pt's family was given HEP of gentle ROM exercises they can safely perform outside of therapy sessions, and she would benefit from tilt bed to progress wt bearing and upright position tolerance while she is dependent on femoral access. Given significance of deficits, pt would benefit from acute inpatient rehab after d/c, will continue to assess as pt progresses and is able to attempt OOB mobility.        Recommendations for  follow up therapy are one component of a multi-disciplinary discharge planning process, led by the attending physician.  Recommendations may be updated based on patient status, additional functional criteria and insurance authorization.  Follow Up Recommendations Acute inpatient rehab (3hours/day)    Assistance Recommended at Discharge Frequent or constant Supervision/Assistance  Patient can return home with the following  Two people to help with walking and/or transfers;Two people to help with bathing/dressing/bathroom;Assistance with cooking/housework;Help with stairs or ramp for entrance;Direct supervision/assist for medications management;Direct supervision/assist for financial management;Assist for transportation    Equipment Recommendations  (defer until pt able to complete OOB transfers)  Recommendations for Other Services  Rehab consult    Functional Status Assessment Patient has had a recent decline in their functional status and demonstrates the ability to make significant improvements in function in a reasonable and predictable amount of time.     Precautions / Restrictions Precautions Precautions: Fall;Sternal Precaution Comments: bilateral femoral access, flexiseal, on 2L O2, cortrack, on CCRT Restrictions Weight Bearing Restrictions: Yes Other Position/Activity Restrictions: bilateral femoral access, limit hip flexion, sternal precautions      Mobility  Bed Mobility Overal bed mobility: Needs Assistance Bed Mobility: Rolling Rolling: Total assist         General bed mobility comments: pt unable to assist with rolling, other transfers limited by femoral lines          Pertinent Vitals/Pain Pain Assessment Pain Assessment: No/denies pain Faces Pain Scale: Hurts little more Pain Location: neck with ROM Pain Descriptors / Indicators: Grimacing Pain Intervention(s): Limited activity within patient's tolerance, Monitored during session,  Repositioned    Home  Living Family/patient expects to be discharged to:: Private residence Living Arrangements: Spouse/significant other Available Help at Discharge: Family;Available 24 hours/day Type of Home: House Home Access: Stairs to enter Entrance Stairs-Rails: Right;Left;Can reach both Entrance Stairs-Number of Steps: 5   Home Layout: One level Home Equipment: None      Prior Function Prior Level of Function : Independent/Modified Independent             Mobility Comments: pt reports no use of DME, states husband is also physically able to assist.       Hand Dominance        Extremity/Trunk Assessment   Upper Extremity Assessment Upper Extremity Assessment: Generalized weakness;RUE deficits/detail;LUE deficits/detail RUE Deficits / Details: significant edema (R > L) with poor grip strength, limited to AAROM against gravity to complete full ROM RUE Sensation: WNL RUE Coordination: decreased fine motor;decreased gross motor LUE Deficits / Details: significant edema (R > L) with poor grip strength, limited to AAROM against gravity to complete full ROM LUE Coordination: decreased fine motor;decreased gross motor    Lower Extremity Assessment Lower Extremity Assessment: Generalized weakness;RLE deficits/detail;LLE deficits/detail RLE Deficits / Details: pt able to complete small ROM at ankle, knee, and hip but limited in hip flexion due to fem lines. RLE Sensation: WNL RLE Coordination: decreased gross motor LLE Deficits / Details: pt able to complete small ROM at ankle, knee, and hip but limited in hip flexion due to fem lines. LLE Sensation: WNL LLE Coordination: decreased gross motor    Cervical / Trunk Assessment Cervical / Trunk Assessment: Other exceptions Cervical / Trunk Exceptions: s/p thoracic surgery and large body habitus. preference for R cervical rotation and flexion, unable to rotate past midline  Communication   Communication: Expressive difficulties (poor arousal at  time of eval)  Cognition Arousal/Alertness: Lethargic Behavior During Therapy: Flat affect Overall Cognitive Status: Impaired/Different from baseline Area of Impairment: Attention, Following commands, Safety/judgement, Awareness, Problem solving                   Current Attention Level: Focused   Following Commands: Follows one step commands inconsistently, Follows one step commands with increased time Safety/Judgement: Decreased awareness of safety, Decreased awareness of deficits Awareness: Intellectual Problem Solving: Slow processing, Decreased initiation, Difficulty sequencing, Requires verbal cues General Comments: pt with inconsistent command following and poor arousal during session. responds well with max verbal stimulation, at times resisting movements with extremities during ROM exercises or repeating prior answer to therapist after being asked a different questions        General Comments General comments (skin integrity, edema, etc.): HR 123 bpm consistently through session, on CRRT, family present and given ROM HEP    Exercises General Exercises - Upper Extremity Wrist Flexion: AAROM, Both, 10 reps, Supine Wrist Extension: AAROM, Both, 10 reps, Supine Digit Composite Flexion: AAROM, Both, 10 reps, Supine Composite Extension: AROM, Both, 10 reps, Supine Low Level/ICU Exercises Ankle Circles/Pumps: PROM, Both, 15 reps, Supine Quad Sets: AAROM, Both, 10 reps, Supine Hip ABduction/ADduction: AAROM, Both, 5 reps, Supine Heel Slides: AAROM, Both, 10 reps, Supine Shoulder Flexion: AAROM, Both, 5 reps, Supine Elbow Flexion: AAROM, Both, 10 reps, Supine   Assessment/Plan    PT Assessment Patient needs continued PT services  PT Problem List Decreased strength;Decreased range of motion;Decreased activity tolerance;Decreased balance;Decreased mobility;Decreased coordination;Cardiopulmonary status limiting activity       PT Treatment Interventions DME  instruction;Gait training;Stair training;Functional mobility training;Therapeutic activities;Therapeutic exercise;Balance training;Patient/family education  PT Goals (Current goals can be found in the Care Plan section)  Acute Rehab PT Goals Patient Stated Goal: to get stronger PT Goal Formulation: With patient Time For Goal Achievement: 01/29/22 Potential to Achieve Goals: Good    Frequency Min 3X/week        AM-PAC PT "6 Clicks" Mobility  Outcome Measure Help needed turning from your back to your side while in a flat bed without using bedrails?: Total Help needed moving from lying on your back to sitting on the side of a flat bed without using bedrails?: Total Help needed moving to and from a bed to a chair (including a wheelchair)?: Total Help needed standing up from a chair using your arms (e.g., wheelchair or bedside chair)?: Total Help needed to walk in hospital room?: Total Help needed climbing 3-5 steps with a railing? : Total 6 Click Score: 6    End of Session Equipment Utilized During Treatment: Oxygen Activity Tolerance: Patient limited by fatigue Patient left: in bed;with call bell/phone within reach;with nursing/sitter in room;with family/visitor present Nurse Communication: Mobility status PT Visit Diagnosis: Unsteadiness on feet (R26.81);Other abnormalities of gait and mobility (R26.89);Muscle weakness (generalized) (M62.81)    Time: 0174-9449 PT Time Calculation (min) (ACUTE ONLY): 39 min   Charges:   PT Evaluation $PT Eval High Complexity: 1 High PT Treatments $Therapeutic Exercise: 23-37 mins        West Carbo, PT, DPT   Acute Rehabilitation Department Pager #: [phone]   Sandra Cockayne 01/15/2022, 5:42 PM

## 2022-01-15 NOTE — Progress Notes (Signed)
°  Echocardiogram 2D Echocardiogram has been performed.  Jody Nguyen 01/15/2022, 11:31 AM

## 2022-01-15 NOTE — Progress Notes (Signed)
ANTICOAGULATION CONSULT NOTE - Follow Up Consult  Pharmacy Consult for bivalirudin Indication:  h/o PE  Labs: Recent Labs    01/13/22 0411 01/13/22 1229 01/13/22 1506 01/13/22 1630 01/13/22 2358 01/14/22 0446 01/14/22 0458 01/14/22 0729 01/14/22 1600 01/15/22 0013 01/15/22 0159  HGB 8.7*   < >  --    < > 8.8* 10.2* 9.0*  --   --  9.9*  --   HCT 27.1*   < >  --    < > 28.4* 30.0* 28.0*  --   --  29.0*  --   PLT 90*  --   --   --  91*  --  93*  --   --   --   --   APTT 72*  --   --   --   --   --  67*  --   --   --  58*  CREATININE 1.38*  --  1.50*  --   --   --   --  1.56* 1.89*  --   --    < > = values in this interval not displayed.    Assessment/Plan:  66yo female therapeutic on bivalirudin after resumed; RN reports that there has been no bleeding since infusion was restarted. Will continue infusion at current rate of 0.0135 mg/kg/hr and monitor PTTs.   Wynona Neat, PharmD, BCPS  01/15/2022,3:01 AM

## 2022-01-15 NOTE — Progress Notes (Signed)
[facility] for bivalirudin Indication: history of pulmonary embolus 2021  Allergies  Allergen Reactions   Minoxidil Other (See Comments)    Pericardial effusion   Lipitor [Atorvastatin] Other (See Comments)    MYALGIAS > "pain in legs" Tolerates rosuvastatin   Morphine And Related Itching   Ace Inhibitors Other (See Comments)    REACTION: "not sure...think it made me drowsy all the time"    Patient Measurements: Height: 4' 11.5" (151.1 cm) Weight: 69.5 kg (153 lb 3.5 oz) IBW/kg (Calculated) : 44.35  Vital Signs: Temp: 98.1 F (36.7 C) (02/19 0415) Temp Source: Axillary (02/19 0415) Pulse Rate: 96 (02/19 0700)  Labs: Recent Labs    01/13/22 2358 01/14/22 0446 01/14/22 0458 01/14/22 0729 01/14/22 1600 01/15/22 0013 01/15/22 0159 01/15/22 0401 01/15/22 0617  HGB 8.8*   < > 9.0*  --   --  9.9*  --  8.5*  --   HCT 28.4*   < > 28.0*  --   --  29.0*  --  27.4*  --   PLT 91*  --  93*  --   --   --   --  113*  --   APTT  --   --  67*  --   --   --  58*  --  60*  CREATININE  --   --   --  1.56* 1.89*  --   --  1.51*  --    < > = values in this interval not displayed.     Estimated Creatinine Clearance: 31.5 mL/min (A) (by C-G formula based on SCr of 1.51 mg/dL (H)).   Assessment: 66 yo female on chronic apixaban for hx of PE 2021  Apixaban has been held for surgery.  Pharmacy asked to resume anticoagulation 2/15 with bivalirudin.  Platelet count rose to the 90s and remains stable.  HIT antibody and SRA both negative.  aPTT this morning therapeutic at 60 on bivalirudin 0.0135 mg/kg/hr. Hemoglobin down to 8.5 from 9, but platelets up from 93 to 113.   Bleeding from groin yesterday, however this has resolved and no bleeding overnight with bivalirudin per RN.   Goal of Therapy:  aPTT goal ~50-70sec - target lower end given low platelet count/risk of post-operative bleeding Monitor platelets by anticoagulation protocol: Yes   Plan:   -Continue bival at 0.0135 mg/kg/hr -aPTT and CBC daily while on bival -Monitor for s/x of bleeding  Cathrine Muster, PharmD PGY2 Cardiology Pharmacy Resident Phone: [phone] 01/15/2022  7:42 AM  Please check [email or web address] for unit-specific pharmacy phone numbers.

## 2022-01-15 NOTE — Progress Notes (Signed)
9 Days Post-Op Procedure(s) (LRB): CORONARY ARTERY BYPASS GRAFTING (CABG) TIMES THREE ON CARDIOPULMONARY BYPASS. LIMA TO LAD, SVG TO PD, SVG TO DIAG (N/A) TRICUSPID VALVE REPAIR USING AN 36MM EDWARDS MC3 TRICUSPID ANNULOPLASTY RING (N/A) TRANSESOPHAGEAL ECHOCARDIOGRAM (TEE) (N/A) APPLICATION OF CELL SAVER (N/A) ENDOVEIN HARVEST OF GREATER SAPHENOUS VEIN (Right) REPAIR OF PATENT FORAMEN OVALE (N/A) Subjective: Feels a little better this AM, denies pain and nausea  Objective: Vital signs in last 24 hours: Temp:  [96.1 F (35.6 C)-98.4 F (36.9 C)] 96.1 F (35.6 C) (02/19 0755) Pulse Rate:  [93-122] 96 (02/19 0700) Cardiac Rhythm: Sinus tachycardia (02/18 2000) Resp:  [7-27] 14 (02/19 0700) SpO2:  [93 %-97 %] 97 % (02/19 0615) Arterial Line BP: (105-118)/(61-71) 110/65 (02/18 1500) Weight:  [69.5 kg] 69.5 kg (02/19 0615)  Hemodynamic parameters for last 24 hours: CVP:  [10 mmHg-38 mmHg] 17 mmHg  Intake/Output from previous day: 02/18 0701 - 02/19 0700 In: 1238.9 [I.V.:514.6; NG/GT:525; IV Piggyback:199.3] Out: 1868  Intake/Output this shift: Total I/O In: 78.7 [I.V.:21.2; NG/GT:45; IV Piggyback:12.5] Out: 108 [Other:108]  General appearance: alert, cooperative, and no distress Neurologic: intact Heart: regular rate and rhythm Lungs: clear to auscultation bilaterally Abdomen: mildly distended, nontender Wound: clean and dry  Lab Results: Recent Labs    01/14/22 0458 01/15/22 0013 01/15/22 0401  WBC 25.9*  --  23.6*  HGB 9.0* 9.9* 8.5*  HCT 28.0* 29.0* 27.4*  PLT 93*  --  113*   BMET:  Recent Labs    01/14/22 1600 01/15/22 0013 01/15/22 0401  NA 138 136 136  K 5.2* 5.2* 4.9  CL 104  --  103  CO2 25  --  24  GLUCOSE 128*  --  125*  BUN 28*  --  26*  CREATININE 1.89*  --  1.51*  CALCIUM 9.6  --  8.7*    PT/INR: No results for input(s): LABPROT, INR in the last 72 hours. ABG    Component Value Date/Time   PHART 7.378 01/15/2022 0013   HCO3 27.5  01/15/2022 0013   TCO2 29 01/15/2022 0013   ACIDBASEDEF 8.0 (H) 01/07/2022 0648   O2SAT 47.9 01/15/2022 0617   CBG (last 3)  Recent Labs    01/14/22 2013 01/15/22 0011 01/15/22 0749  GLUCAP 154* 135* 91    Assessment/Plan: S/P Procedure(s) (LRB): CORONARY ARTERY BYPASS GRAFTING (CABG) TIMES THREE ON CARDIOPULMONARY BYPASS. LIMA TO LAD, SVG TO PD, SVG TO DIAG (N/A) TRICUSPID VALVE REPAIR USING AN 36MM EDWARDS MC3 TRICUSPID ANNULOPLASTY RING (N/A) TRANSESOPHAGEAL ECHOCARDIOGRAM (TEE) (N/A) APPLICATION OF CELL SAVER (N/A) ENDOVEIN HARVEST OF GREATER SAPHENOUS VEIN (Right) REPAIR OF PATENT FORAMEN OVALE (N/A) - CV- co-ox low this AM, milrinone started  Epi 6, norepi 10, milrinone 0.25  Agree with plan for echo RESP- stable RENAL- on CVVHD  ENDO- CBG low this AM, mildly elevated for the most part GI- on TF Vial at 45 ml/hr ID- on Zosyn, vanco, afebrile, WBC down slightly  Lines changed out yesterday HEME- on bivalrudin, thrombocytopenia resolvng   LOS: 14 days    [name] 01/15/2022

## 2022-01-15 NOTE — Progress Notes (Signed)
[facility] Progress Note  Name: Jody Nguyen MRN: [id number] DOB: 1956/03/18   Subjective:  Patient seen and examined on CRRT.  No acute events New hd line in rt fem Remains on epi & levo Net neg ~674cc   Intake/Output Summary (Last 24 hours) at 01/15/2022 0732 Last data filed at 01/15/2022 0700 Gross per 24 hour  Intake 1193.86 ml  Output 1868 ml  Net -674.14 ml    Vitals:  Vitals:   01/15/22 0615 01/15/22 0630 01/15/22 0645 01/15/22 0700  BP:      Pulse: 93 94 95 96  Resp: 13 14 15 14   Temp:      TempSrc:      SpO2: 97%     Weight: 69.5 kg     Height:         Physical Exam:  General: tired appearing, NAD Lungs: slightly inc'd WOB Heart S1S2  Abdomen soft obese Extremities: generalized edema Neuro : awake, tired Access: right arm AVF in place, no bruit or thrill/swollen; right fem temp HD line c/d/i  Medications reviewed   Labs:  BMP Latest Ref Rng & Units 01/15/2022 01/15/2022 01/14/2022  Glucose 70 - 99 mg/dL 125(H) - 128(H)  BUN 8 - 23 mg/dL 26(H) - 28(H)  Creatinine 0.44 - 1.00 mg/dL 1.51(H) - 1.89(H)  BUN/Creat Ratio 6 - 22 (calc) - - -  Sodium 135 - 145 mmol/L 136 136 138  Potassium 3.5 - 5.1 mmol/L 4.9 5.2(H) 5.2(H)  Chloride 98 - 111 mmol/L 103 - 104  CO2 22 - 32 mmol/L 24 - 25  Calcium 8.9 - 10.3 mg/dL 8.7(L) - 9.6   Dialysis Orders: MWF at [facility] 3:15hr, 450/A1.5, EDW 75.5kg, 2K/2Ca, UFP #2, AVF, heparin 2500 unit bolus - Hectoral 79mcg IV q HD - Sensipar 90mg  PO q HD - Mircera 162mcg IV q 2 weeks (ordered, not yet given. Last Mircera 100 on 12/07/21)   Assessment/Plan:   CAD s/p CABG x3, TR sp TVR:  per CT surgery   ESRD:  normally MWF schedule, last IHD Thursday 2/09. Remains on pressors x 3. CRRT started 2/11, remains critically ill. Cont CRRT for now, plan to transition to IHD as she comes off pressors. On angiomax Infiltrated AVF - AVF infiltrated on Tuesday 2/7. Expert cannulator able to stick 2/09 and completed full  treatment. Not functional, [facility] following. Planning for [facility] possibly this week provided she remains stable, appreciate assistance from [facility] Hypotension - down to 2 pressors. Pressor support per primary service Leukocytosis-per pmd, on broad spectrum abx Thrombycytopenia-HIT/SRA neg. likely related to crrt. per PMD, on angiomax Volume: hx of HTN now in shock post-op. Currently running net even, can inc to net neg 50cc/hr to start with  Anemia CKD : s/p PRBC's , Hb 8.5 transfuse prn, esa ordered to start 7/34  Metabolic bone disease: resume home binders and sensipar when taking PO. Cont IV vdra.   Pulm HTN:  Followed at [facility], on Revatio and opsumit  T2DM-per primary service  Hx of MV replacement - in 2016  Gean Quint, MD 01/15/2022 7:32 AM

## 2022-01-15 NOTE — Progress Notes (Signed)
NAME:  Jody Nguyen, MRN:  [id number], DOB:  01-05-1956, LOS: 61 ADMISSION DATE:  01/18/2022, CONSULTATION DATE:  01/12/2022 REFERRING MD:  Martie Lee, TRH, CHIEF COMPLAINT:  SOB   History of Present Illness:  66 yo F PMH ESRD, non-obstructive CAD, suspected pulmonary hypertension, Hx PE on eliquis who presented to [facility] ED 2/4 with chest pain, which radiates to arm. Similar sx had also promoted ED presentation on 2/1 at which time trops were 100. She was seen by cardiology outpt on 2/2 and chest pain was felt to be related to volume shift as pain had occurred during dialysis.   On 2/4 presentation, Pt states that episodic chest pain has been intermittent for the past week. Non-exertional, lasts approx 10 minutes. Associated nausea and SOB. Non-specific EKG changes  & was d/w cardiology, ruled out for STEMI. Trop 200. Placed on [place] for SpO2 80s.Admitted to [facility] for obvs with concern for PE and started on heparin gtt. Awaiting VQ scan. ECHO has been performed which reveals Severe RV RA enlargement, severely reduced RV function and septal bowing, felt consistent with chronic process such as severe pulmonary HTN.    PCCM has been consulted in this setting. She remains critically ill.   Pertinent  Medical History  ESRD PE Pulmonary HTN  CAD CVA HLD HTN S/p MV repair  Pericardial effusion r/t minoxidil  Diastolic HF DM2  Significant Hospital Events: Including procedures, antibiotic start and stop dates in addition to other pertinent events   2/4 ED presentation with recurring chest pain -- STEMI ruled out. Stable, mildly hypoxic and placed on [place] 2/5 admit to [facility] for obvs. Concern for PE, ECHO performed by cardiology reportedly shows RV failure, but felt more consistent with chronic process like severe pulmHTN. Awaiting VQ scan. Pulm consulted  2/7 cardiac cath. 3 vessel dz. CABG rec 2/10 CABG, TVR 2/11 continued vasoplegic shock with very high vasopressor requirements  2/12 Given methylene blue  x 2 2/13 chest tube removed 2/14 JP removed 2/15 pacing wires removed 2/16 milrinone stopped, extubated 2/17 zosyn for GNR bacteremia 2/18 arterial line, central line, vascath all removed and replaced.   Interim History / Subjective:  Lines all exchanged. Coox is low off of femoral central line, epi titrated up overnight, levo down to 10.  She says she's feeling a little better today.   Objective   Blood pressure 132/76, pulse 96, temperature 98.1 F (36.7 C), temperature source Axillary, resp. rate 14, height 4' 11.5" (1.511 m), weight 69.5 kg, SpO2 97 %. CVP:  [10 mmHg-38 mmHg] 17 mmHg      Intake/Output Summary (Last 24 hours) at 01/15/2022 0737 Last data filed at 01/15/2022 0700 Gross per 24 hour  Intake 1193.86 ml  Output 1868 ml  Net -674.14 ml   Filed Weights   01/13/22 0500 01/14/22 0600 01/15/22 0615  Weight: 74 kg 69.9 kg 69.5 kg    Examination: General appearance: 66 y.o., female, drowsy but arousable Eyes: PERRL, tracking appropriately HENT: NCAT; dry MM Neck: hematoma over left supraclavicular area Lungs: diminished bl, with normal respiratory effort CV: tachy, RR, no murmur, mid line sternotomy cdi Abdomen: Soft, non-tender; non-distended, BS present  Extremities: 1+ peripheral edema, warm, RUE AVF bl without bruit, swollen/ecchymotic Neuro: grossly no focal deficit    Ancillary tests personally reviewed:    WBC stable Hb drifting PLTs trending up  Assessment & Plan:  Coronary artery disease: 3 vessel disease diagnosed on cardiac cath 2/7.  CABG x3 on 2/10 LIMA to LAD,  SVG to PD, SVG to DIAG S/p CABG 2/10. Tricuspid regurgitation, severe: S/P TVR 2/10 tricuspid annuloplasty ring in place HX S/p MVR 2016 Acute on chronic systolic/diastolic CHF Mixed cardiogenic and septic shock:  -TCTS following appreciate assistance and management -f/u BCx, continue zosyn -Goal MAP 60-65. Continue epi, wean levo first to goal map. -Add milrinone, start at  0.25 -Check TTE -Consult heart failure -Continue CVVHD as below  Acute respiratory failure with hypoxia  Combined pre and post capillary PH  -Was on revatio and opsumit. Will reassess over course of hospitalization but limited evidence base in patients with valve related post capillary PH after repair/replacement.   ESRD on HD Thrombosed AVF Neph,vascular suspect clotted AVF -Neph following appreciate assistance  -Will address thrombosed AVF once no longer critically ill. -Will need TDC when more stable -Continue CRRT. Goal UF -50/hr as tolerated  HLD DM2 -Blood Glucose goal 140-180. -SSI -Continue levemir to 15 u  Acute blood loss anemia Suspect secondary to surgery, frequent CRRT circuit changes, and chronic kidney disease -Transfuse PRBC if HBG less than 7 -trend CBC -Monitor for signs of bleeding  Hx PE - ?CTEPH Thrombocytopenia - suspect CRRT related HITT panel 0.075. SRA negative.  -Despite negative HIT workup will continue bivalirudin for ease of titration within therapeutic range -Was on [facility] previously for PE/CTEPH   Best Practice (right click and "Reselect all SmartList Selections" daily)   Diet/type: tubefeeds   DVT prophylaxis: other- on bivalirudin GI prophylaxis: PPI Lines: Central line, Dialysis Catheter, Arterial Line, and yes and it is still needed  Foley:  N/A Code Status:  full code Last date of multidisciplinary goals of care discussion [Family updated 2/18]   Critical care time: 40 minutes  [place]  01/15/2022 , 7:37 AM  Please see [email or web address] for pager details  If no response, please call [phone] After hours, please call Elink at [phone]

## 2022-01-16 ENCOUNTER — Other Ambulatory Visit: Payer: Self-pay | Admitting: Student

## 2022-01-16 ENCOUNTER — Encounter (HOSPITAL_COMMUNITY): Payer: Self-pay | Admitting: Internal Medicine

## 2022-01-16 ENCOUNTER — Inpatient Hospital Stay (HOSPITAL_COMMUNITY): Payer: Medicare Other

## 2022-01-16 DIAGNOSIS — R000 Tachycardia, unspecified: Secondary | ICD-10-CM

## 2022-01-16 DIAGNOSIS — I272 Pulmonary hypertension, unspecified: Secondary | ICD-10-CM | POA: Diagnosis not present

## 2022-01-16 DIAGNOSIS — Z9889 Other specified postprocedural states: Secondary | ICD-10-CM

## 2022-01-16 DIAGNOSIS — I5042 Chronic combined systolic (congestive) and diastolic (congestive) heart failure: Secondary | ICD-10-CM | POA: Diagnosis not present

## 2022-01-16 DIAGNOSIS — Z951 Presence of aortocoronary bypass graft: Secondary | ICD-10-CM

## 2022-01-16 DIAGNOSIS — R7881 Bacteremia: Secondary | ICD-10-CM | POA: Diagnosis not present

## 2022-01-16 DIAGNOSIS — R570 Cardiogenic shock: Secondary | ICD-10-CM | POA: Diagnosis not present

## 2022-01-16 LAB — POCT I-STAT 7, (LYTES, BLD GAS, ICA,H+H)
Acid-Base Excess: 2 mmol/L (ref 0.0–2.0)
Acid-Base Excess: 2 mmol/L (ref 0.0–2.0)
Bicarbonate: 27.3 mmol/L (ref 20.0–28.0)
Bicarbonate: 27 mmol/L (ref 20.0–28.0)
Calcium, Ion: 1.29 mmol/L (ref 1.15–1.40)
Calcium, Ion: 1.28 mmol/L (ref 1.15–1.40)
HCT: 28 % — ABNORMAL LOW (ref 36.0–46.0)
HCT: 27 % — ABNORMAL LOW (ref 36.0–46.0)
Hemoglobin: 9.5 g/dL — ABNORMAL LOW (ref 12.0–15.0)
Hemoglobin: 9.2 g/dL — ABNORMAL LOW (ref 12.0–15.0)
O2 Saturation: 97 %
O2 Saturation: 97 %
Patient temperature: 36
Patient temperature: 98.8
Potassium: 4.7 mmol/L (ref 3.5–5.1)
Potassium: 4.6 mmol/L (ref 3.5–5.1)
Sodium: 137 mmol/L (ref 135–145)
Sodium: 137 mmol/L (ref 135–145)
TCO2: 29 mmol/L (ref 22–32)
TCO2: 28 mmol/L (ref 22–32)
pCO2 arterial: 45.4 mmHg (ref 32–48)
pCO2 arterial: 43.3 mmHg (ref 32–48)
pH, Arterial: 7.382 (ref 7.35–7.45)
pH, Arterial: 7.403 (ref 7.35–7.45)
pO2, Arterial: 95 mmHg (ref 83–108)
pO2, Arterial: 90 mmHg (ref 83–108)

## 2022-01-16 LAB — CBC WITH DIFFERENTIAL/PLATELET
Abs Immature Granulocytes: 0.34 10*3/uL — ABNORMAL HIGH (ref 0.00–0.07)
Basophils Absolute: 0.1 10*3/uL (ref 0.0–0.1)
Basophils Relative: 0 %
Eosinophils Absolute: 0.3 10*3/uL (ref 0.0–0.5)
Eosinophils Relative: 2 %
HCT: 27.2 % — ABNORMAL LOW (ref 36.0–46.0)
Hemoglobin: 8.4 g/dL — ABNORMAL LOW (ref 12.0–15.0)
Immature Granulocytes: 2 %
Lymphocytes Relative: 4 %
Lymphs Abs: 1 10*3/uL (ref 0.7–4.0)
MCH: 29.8 pg (ref 26.0–34.0)
MCHC: 30.9 g/dL (ref 30.0–36.0)
MCV: 96.5 fL (ref 80.0–100.0)
Monocytes Absolute: 1.8 10*3/uL — ABNORMAL HIGH (ref 0.1–1.0)
Monocytes Relative: 8 %
Neutro Abs: 18 10*3/uL — ABNORMAL HIGH (ref 1.7–7.7)
Neutrophils Relative %: 84 %
Platelets: 154 10*3/uL (ref 150–400)
RBC: 2.82 MIL/uL — ABNORMAL LOW (ref 3.87–5.11)
RDW: 22.1 % — ABNORMAL HIGH (ref 11.5–15.5)
WBC: 21.4 10*3/uL — ABNORMAL HIGH (ref 4.0–10.5)
nRBC: 33.8 % — ABNORMAL HIGH (ref 0.0–0.2)

## 2022-01-16 LAB — RENAL FUNCTION PANEL
Albumin: 2.5 g/dL — ABNORMAL LOW (ref 3.5–5.0)
Anion gap: 11 (ref 5–15)
BUN: 21 mg/dL (ref 8–23)
CO2: 24 mmol/L (ref 22–32)
Calcium: 8.7 mg/dL — ABNORMAL LOW (ref 8.9–10.3)
Chloride: 102 mmol/L (ref 98–111)
Creatinine, Ser: 1.42 mg/dL — ABNORMAL HIGH (ref 0.44–1.00)
GFR, Estimated: 41 mL/min — ABNORMAL LOW (ref >60)
Glucose, Bld: 274 mg/dL — ABNORMAL HIGH (ref 70–99)
Phosphorus: 2.5 mg/dL (ref 2.5–4.6)
Potassium: 4.9 mmol/L (ref 3.5–5.1)
Sodium: 137 mmol/L (ref 135–145)

## 2022-01-16 LAB — BASIC METABOLIC PANEL
Anion gap: 10 (ref 5–15)
BUN: 21 mg/dL (ref 8–23)
CO2: 27 mmol/L (ref 22–32)
Calcium: 8.8 mg/dL — ABNORMAL LOW (ref 8.9–10.3)
Chloride: 100 mmol/L (ref 98–111)
Creatinine, Ser: 1.33 mg/dL — ABNORMAL HIGH (ref 0.44–1.00)
GFR, Estimated: 44 mL/min — ABNORMAL LOW (ref >60)
Glucose, Bld: 190 mg/dL — ABNORMAL HIGH (ref 70–99)
Potassium: 4.7 mmol/L (ref 3.5–5.1)
Sodium: 137 mmol/L (ref 135–145)

## 2022-01-16 LAB — COOXEMETRY PANEL
Carboxyhemoglobin: 3 % — ABNORMAL HIGH (ref 0.5–1.5)
Carboxyhemoglobin: 2.6 % — ABNORMAL HIGH (ref 0.5–1.5)
Methemoglobin: <0.7 % (ref 0.0–1.5)
Methemoglobin: <0.7 % (ref 0.0–1.5)
O2 Saturation: 63.5 %
O2 Saturation: 69 %
Total hemoglobin: 8.4 g/dL — ABNORMAL LOW (ref 12.0–16.0)
Total hemoglobin: 8 g/dL — ABNORMAL LOW (ref 12.0–16.0)

## 2022-01-16 LAB — CULTURE, BLOOD (ROUTINE X 2): Special Requests: ADEQUATE

## 2022-01-16 LAB — GLUCOSE, CAPILLARY
Glucose-Capillary: 89 mg/dL (ref 70–99)
Glucose-Capillary: 161 mg/dL — ABNORMAL HIGH (ref 70–99)
Glucose-Capillary: 156 mg/dL — ABNORMAL HIGH (ref 70–99)
Glucose-Capillary: 247 mg/dL — ABNORMAL HIGH (ref 70–99)
Glucose-Capillary: 269 mg/dL — ABNORMAL HIGH (ref 70–99)

## 2022-01-16 LAB — APTT: aPTT: 69 seconds — ABNORMAL HIGH (ref 24–36)

## 2022-01-16 LAB — MAGNESIUM: Magnesium: 2.6 mg/dL — ABNORMAL HIGH (ref 1.7–2.4)

## 2022-01-16 LAB — LACTIC ACID, PLASMA: Lactic Acid, Venous: 1.3 mmol/L (ref 0.5–1.9)

## 2022-01-16 MED ORDER — INSULIN ASPART 100 UNIT/ML IJ SOLN
0.0000 [IU] | INTRAMUSCULAR | Status: DC
  Administered 2022-01-16: 5 [IU] via SUBCUTANEOUS
  Administered 2022-01-16: 3 [IU] via SUBCUTANEOUS
  Administered 2022-01-16: 2 [IU] via SUBCUTANEOUS
  Administered 2022-01-17: 5 [IU] via SUBCUTANEOUS
  Administered 2022-01-17: 7 [IU] via SUBCUTANEOUS
  Administered 2022-01-17: 5 [IU] via SUBCUTANEOUS

## 2022-01-16 MED ORDER — INSULIN DETEMIR 100 UNIT/ML ~~LOC~~ SOLN
15.0000 [IU] | Freq: Two times a day (BID) | SUBCUTANEOUS | Status: DC
  Administered 2022-01-16 (×2): 15 [IU] via SUBCUTANEOUS
  Filled 2022-01-16 (×4): qty 0.15

## 2022-01-16 NOTE — Progress Notes (Unsigned)
Ordered 6 week post-op Echo following CABG with tricuspid valve repair per protocol. Primary Cardiologist: Dr. Stanford Breed CT Surgeon: Dr. Jenell Milliner, PA-C 01/16/2022 2:09 PM

## 2022-01-16 NOTE — Progress Notes (Signed)
Patient ID: Jody Nguyen, female   DOB: Dec 05, 1955, 66 y.o.   MRN: [id number] TCTS Evening Rounds:  On Milrinone 0.25, epi 2, NE 20 and Vaso 0.03 with MAP 60's to 70's, ST 120's  Co-ox 69 at noon.   CVP 10-13 this afternoon  On CRRT to keep even.   4L [place] with sats 94%  Tube feeds for nutrition.

## 2022-01-16 NOTE — Progress Notes (Signed)
Inpatient Rehab Admissions Coordinator:   Per PT recommendation, patient was screened for CIR candidacy by Clemens Catholic, MS, CCC-SLP. At this time, Pt. Is not total A, not yet tolerating OOB. I do not think she is able to tolerate the intensity of CIR at this time; however,   Pt. may have potential to progress to becoming a potential CIR candidate, so CIR admissions team will follow and monitor for progress and participation with therapies and place consult order if Pt. appears to be an appropriate candidate. Please contact me with any questions.   Clemens Catholic, [place], [place] Admissions Coordinator  [phone] ([place]) [phone] (office)

## 2022-01-16 NOTE — Progress Notes (Signed)
SLP Cancellation Note  Patient Details Name: Jody Nguyen MRN: [id number] DOB: Mar 26, 1956   Cancelled treatment:       Reason Eval/Treat Not Completed: Medical issues which prohibited therapy per RN. SLP will continue efforts.  [name] L. Tivis Ringer, [place] CCC/SLP Acute Rehabilitation Services Office number [phone] Pager [phone]    [name] 01/16/2022, 11:10 AM

## 2022-01-16 NOTE — Progress Notes (Signed)
10 Days Post-Op Procedure(s) (LRB): CORONARY ARTERY BYPASS GRAFTING (CABG) TIMES THREE ON CARDIOPULMONARY BYPASS. LIMA TO LAD, SVG TO PD, SVG TO DIAG (N/A) TRICUSPID VALVE REPAIR USING AN 36MM EDWARDS MC3 TRICUSPID ANNULOPLASTY RING (N/A) TRANSESOPHAGEAL ECHOCARDIOGRAM (TEE) (N/A) APPLICATION OF CELL SAVER (N/A) ENDOVEIN HARVEST OF GREATER SAPHENOUS VEIN (Right) REPAIR OF PATENT FORAMEN OVALE (N/A) Subjective: No complaints this AM  Objective: Vital signs in last 24 hours: Temp:  [94.4 F (34.7 C)-97.7 F (36.5 C)] 94.4 F (34.7 C) (02/20 0630) Pulse Rate:  [97-130] 113 (02/20 0700) Cardiac Rhythm: Sinus tachycardia (02/19 2000) Resp:  [13-27] 15 (02/20 0700) SpO2:  [93 %-100 %] 93 % (02/20 0630) Weight:  [66.9 kg] 66.9 kg (02/20 0630)  Hemodynamic parameters for last 24 hours: CVP:  [8 mmHg-23 mmHg] 13 mmHg  Intake/Output from previous day: 02/19 0701 - 02/20 0700 In: 2110.6 [I.V.:833.9; NG/GT:1105; IV Piggyback:171.7] Out: 4471 [Stool:1300] Intake/Output this shift: No intake/output data recorded.  General appearance: alert, cooperative, and no distress Neurologic: intact Heart: tachy, regular Lungs: clear to auscultation bilaterally Abdomen: mildly distended, nontender  Lab Results: Recent Labs    01/15/22 0401 01/15/22 1956 01/16/22 0134 01/16/22 0340  WBC 23.6*  --   --  21.4*  HGB 8.5*   < > 9.5* 8.4*  HCT 27.4*   < > 28.0* 27.2*  PLT 113*  --   --  154   < > = values in this interval not displayed.   BMET:  Recent Labs    01/15/22 1600 01/15/22 1956 01/16/22 0134 01/16/22 0340  NA 136   < > 137 137  K 4.6   < > 4.7 4.7  CL 101  --   --  100  CO2 25  --   --  27  GLUCOSE 74  --   --  190*  BUN 21  --   --  21  CREATININE 1.30*  --   --  1.33*  CALCIUM 9.0  --   --  8.8*   < > = values in this interval not displayed.    PT/INR: No results for input(s): LABPROT, INR in the last 72 hours. ABG    Component Value Date/Time   PHART 7.382  01/16/2022 0134   HCO3 27.3 01/16/2022 0134   TCO2 29 01/16/2022 0134   ACIDBASEDEF 8.0 (H) 01/07/2022 0648   O2SAT 63.5 01/16/2022 0340   CBG (last 3)  Recent Labs    01/15/22 1954 01/15/22 2329 01/16/22 0339  GLUCAP 78 112* 161*    Assessment/Plan: S/P Procedure(s) (LRB): CORONARY ARTERY BYPASS GRAFTING (CABG) TIMES THREE ON CARDIOPULMONARY BYPASS. LIMA TO LAD, SVG TO PD, SVG TO DIAG (N/A) TRICUSPID VALVE REPAIR USING AN 36MM EDWARDS MC3 TRICUSPID ANNULOPLASTY RING (N/A) TRANSESOPHAGEAL ECHOCARDIOGRAM (TEE) (N/A) APPLICATION OF CELL SAVER (N/A) ENDOVEIN HARVEST OF GREATER SAPHENOUS VEIN (Right) REPAIR OF PATENT FORAMEN OVALE (N/A) - NEURO- intact CV- still tachycardic but improved  Co-ox up to 64 with milrinone  Wean epi. Levophed as tolerated RESP- O2 sats ok on 4L [place] RENAL- on CVVHD.  2.3 L negative yesterday, fairly large volume of stool GI- on TF at goal ENDO- CBG mildly elevated Anemia stable Thrombocytopenia resolved Anticoagulation- on bivalrudin ID- WBC slowly trending down  On Vanco/ Zosyn  LOS: 15 days    Jody Nguyen 01/16/2022

## 2022-01-16 NOTE — Progress Notes (Signed)
[address] for bivalirudin Indication: history of pulmonary embolus 2021  Allergies  Allergen Reactions   Minoxidil Other (See Comments)    Pericardial effusion   Lipitor [Atorvastatin] Other (See Comments)    MYALGIAS > "pain in legs" Tolerates rosuvastatin   Morphine And Related Itching   Ace Inhibitors Other (See Comments)    REACTION: "not sure...think it made me drowsy all the time"    Patient Measurements: Height: 4' 11.5" (151.1 cm) Weight: 66.9 kg (147 lb 7.8 oz) IBW/kg (Calculated) : 44.35  Vital Signs: Temp: 94.4 F (34.7 C) (02/20 0630) Temp Source: Axillary (02/20 0630) Pulse Rate: 113 (02/20 0700)  Labs: Recent Labs    01/14/22 0458 01/14/22 0729 01/15/22 0159 01/15/22 0401 01/15/22 0617 01/15/22 1600 01/15/22 1956 01/16/22 0134 01/16/22 0340  HGB 9.0*   < >  --  8.5*  --   --  9.2* 9.5* 8.4*  HCT 28.0*   < >  --  27.4*  --   --  27.0* 28.0* 27.2*  PLT 93*  --   --  113*  --   --   --   --  154  APTT 67*  --  58*  --  60*  --   --   --  69*  CREATININE  --    < >  --  1.51*  --  1.30*  --   --  1.33*   < > = values in this interval not displayed.     Estimated Creatinine Clearance: 35.1 mL/min (A) (by C-G formula based on SCr of 1.33 mg/dL (H)).   Assessment: 66 yo female on chronic apixaban for hx of PE 2021  Apixaban has been held for surgery.  Pharmacy asked to resume anticoagulation 2/15 with bivalirudin.  HIT antibody and SRA both negative.  Platelet count now recovered.  aPTT this morning therapeutic at 69 on bivalirudin 0.0135 mg/kg/hr. Hemoglobin down to 8.4 but platelets up from 113 > 154.   No bleeding or complications currently noted.  Goal of Therapy:  aPTT goal ~50-70sec - target lower end given low platelet count/risk of post-operative bleeding Monitor platelets by anticoagulation protocol: Yes   Plan:  -Continue bival at 0.0135 mg/kg/hr -aPTT and CBC daily while on bival -Monitor for s/x  of bleeding  Marguerite Olea, [facility] Clinical Pharmacist  01/16/2022 11:12 AM   [facility] pharmacy phone numbers are listed on [email or web address]

## 2022-01-16 NOTE — Progress Notes (Signed)
[facility] Progress Note  Name: Jody Nguyen MRN: [id number] DOB: 1956/01/05   Subjective: I/O were - 617 cc yesterday. Having some diarrheal losses as well. CVP 12-17. Levo gtt at 20 ug/min and vaso gtt on board. Getting epi gtt and milrinone as well.    Intake/Output Summary (Last 24 hours) at 01/16/2022 1406 Last data filed at 01/16/2022 1300 Gross per 24 hour  Intake 2356.2 ml  Output 3710 ml  Net -1353.8 ml     Vitals:  Vitals:   01/16/22 1300 01/16/22 1315 01/16/22 1330 01/16/22 1345  BP:      Pulse: (!) 121 (!) 118 (!) 121 (!) 120  Resp: 15 17 15 16   Temp:      TempSrc:      SpO2: 95% 94% 92% 93%  Weight:      Height:         Physical Exam:  General: tired appearing, NAD Lungs: no ^wob Heart S1S2  Abdomen soft obese Extremities: minimal LE , LUE edema, +RUE edema Neuro : awake, tired, responsive Access: right arm AVF in place, no bruit or thrill/+swollen; right fem temp HD line c/d/i  Medications reviewed   Labs:  BMP Latest Ref Rng & Units 01/16/2022 01/16/2022 01/16/2022  Glucose 70 - 99 mg/dL - 190(H) -  BUN 8 - 23 mg/dL - 21 -  Creatinine 0.44 - 1.00 mg/dL - 1.33(H) -  BUN/Creat Ratio 6 - 22 (calc) - - -  Sodium 135 - 145 mmol/L 137 137 137  Potassium 3.5 - 5.1 mmol/L 4.6 4.7 4.7  Chloride 98 - 111 mmol/L - 100 -  CO2 22 - 32 mmol/L - 27 -  Calcium 8.9 - 10.3 mg/dL - 8.8(L) -   OP HD: MWF [place] 3:15h   450/A1.5   75.5kg   2K/2Ca bath  P2   AVF  Hep  2500 - Hectoral 59mcg IV q HD - Sensipar 90mg  PO q HD - Mircera 12mcg IV q 2 weeks (ordered, not yet given. Last Mircera 100 on 12/07/21)   Assessment/Plan:   CAD s/p CABG x3, TR sp TVR:  per CT surgery   ESRD:  normally MWF schedule, last IHD Thursday 2/09. Remains on pressors x 3. CRRT started 2/11, remains critically ill. Cont CRRT for now, plan to transition to IHD as she comes off pressors. On angiomax Infiltrated AVF - AVF infiltrated on 2/7. Expert cannulator able to stick 2/09  and completed HD pre CABG. Post-op AVF is nonfunctional, VVS following. Planning for [facility] possibly this week provided she remains stable, appreciate assistance  Hypotension - down to 2 pressors. Pressor support per primary service Leukocytosis-per pmd, on broad spectrum abx Thrombycytopenia-HIT/SRA neg. Likely related to CRRT. per PMD, on angiomax Volume: hx of HTN, remains in shock here. Is below dry wt 6-7kg. May be intravasc dry. Will keep 50 cc/hr+ today and dc CRRT when next filter clots or at and of the dayshift. See if BP's will improve off CRRT.   Anemia CKD : s/p PRBC's , Hb 8.5 transfuse prn, esa ordered to start 6/30  Metabolic bone disease: resume home binders and sensipar when taking PO. Cont IV vdra.   Pulm HTN:  Followed at [facility], on Revatio and opsumit  T2DM-per primary service  Hx of MV replacement - in 2016  Sol Blazing, MD 01/16/2022 2:06 PM

## 2022-01-16 NOTE — Plan of Care (Signed)
Remains on vasopressor for BP support.  Please call when patient out of ICU, and we can place a tunneled dialysis catheter.  Yevonne Aline. Stanford Breed, MD [facility] Phone Number: [phone] 01/16/2022 10:10 AM

## 2022-01-16 NOTE — Progress Notes (Addendum)
NAME:  Jody Nguyen, MRN:  [id number], DOB:  Sep 29, 1956, LOS: 68 ADMISSION DATE:  12/28/2021, CONSULTATION DATE:  12/29/2021 REFERRING MD:  Martie Lee, TRH, CHIEF COMPLAINT:  SOB   History of Present Illness:  66 yo F PMH ESRD, non-obstructive CAD, suspected pulmonary hypertension, Hx PE on eliquis who presented to [facility] ED 2/4 with chest pain, which radiates to arm. Similar sx had also promoted ED presentation on 2/1 at which time trops were 100. She was seen by cardiology outpt on 2/2 and chest pain was felt to be related to volume shift as pain had occurred during dialysis.   On 2/4 presentation, Pt states that episodic chest pain has been intermittent for the past week. Non-exertional, lasts approx 10 minutes. Associated nausea and SOB. Non-specific EKG changes  & was d/w cardiology, ruled out for STEMI. Trop 200. Placed on [place] for SpO2 80s.Admitted to [facility] for obvs with concern for PE and started on heparin gtt. Awaiting VQ scan. ECHO has been performed which reveals Severe RV RA enlargement, severely reduced RV function and septal bowing, felt consistent with chronic process such as severe pulmonary HTN.    PCCM has been consulted in this setting. She remains critically ill.   Pertinent  Medical History  ESRD PE Pulmonary HTN  CAD CVA HLD HTN S/p MV repair  Pericardial effusion r/t minoxidil  Diastolic HF DM2  Significant Hospital Events: Including procedures, antibiotic start and stop dates in addition to other pertinent events   2/4 ED presentation with recurring chest pain -- STEMI ruled out. Stable, mildly hypoxic and placed on [place] 2/5 admit to [facility] for obvs. Concern for PE, ECHO performed by cardiology reportedly shows RV failure, but felt more consistent with chronic process like severe pulmHTN. Awaiting VQ scan. Pulm consulted  2/7 cardiac cath. 3 vessel dz. CABG rec 2/10 CABG, TVR 2/11 continued vasoplegic shock with very high vasopressor requirements  2/12 Given methylene blue  x 2 2/13 chest tube removed 2/14 JP removed 2/15 pacing wires removed 2/16 milrinone stopped, extubated 2/17 zosyn for GNR bacteremia-> Morganella morganii 2/18 arterial line, central line, vascath all removed and replaced. 2/19 milrinone back on. Co-ox improved. ECHO Left ventricular ejection fraction, by estimation, is 50 to 55%. The  left ventricle has low normal function. No regional WM abnormality. Right Ventricle: Ventricular septum is flattened in systole and diastole  suggesting RV pressure and volume overload. The right ventricular size is  severely enlarged. Right vetricular wall thickness was not well  visualized. Right ventricular systolic  function is severely reduced. There is moderately elevated pulmonary  artery systolic pressure 9/21 still high pressor needs.   Interim History / Subjective:  She feels better today   Objective   Blood pressure 132/76, pulse (Abnormal) 113, temperature (Abnormal) 94.4 F (34.7 C), temperature source Axillary, resp. rate 15, height 4' 11.5" (1.511 m), weight 66.9 kg, SpO2 93 %. CVP:  [8 mmHg-23 mmHg] 13 mmHg      Intake/Output Summary (Last 24 hours) at 01/16/2022 0741 Last data filed at 01/16/2022 0700 Gross per 24 hour  Intake 2110.59 ml  Output 4471 ml  Net -2360.41 ml   Filed Weights   01/14/22 0600 01/15/22 0615 01/16/22 0630  Weight: 69.9 kg 69.5 kg 66.9 kg    Examination: General obese, debilitated and critically ill 66 year old female who remains pressor dependent and is still on CRRT HENT NCAT on JVD MMM Pulm some scattered rhonchi no accessory use on 4 LPM. Last CXR 18th  w/ basilar atx low vol films Card tachy ST w/ PVCs Abd soft  Ext dependent edema Neuro intact GU anuric   Assessment & Plan:  Principal Problem:   Chest pain, Multivessel CAD Active Problems:   Coronary artery disease involving native coronary artery of native heart with unstable angina pectoris ([place])   Pulmonary hypertension (HCC)   S/P CABG x  3   S/P TVR (tricuspid valve repair)   Hyperlipidemia associated with type 2 diabetes mellitus (HCC)   Anemia of chronic renal failure   Type 2 diabetes mellitus (HCC)   Obesity (BMI 30-39.9)   Chronic combined systolic and diastolic heart failure (HCC)   End stage renal disease on dialysis (HCC)   History of stroke   QT prolongation   SVT (supraventricular tachycardia) (HCC)   Hyponatremia   GERD (gastroesophageal reflux disease)   Septic shock (HCC)   Gram-negative bacteremia   Cardiogenic shock (HCC)   CAD w/ TR  s/p CABG x3 and TVR on 2/10. Complicated by Acute on chronic systolic/diastolic CHF and Mixed cardiogenic and septic shock w/ morganella morganii bacteremia  -TCTS following appreciate assistance and management -co-ox improved since yesterday  Plan Cont zosyn as we await sensitivities  Cont epi and norepi infusions; weaning norepi first. MAP goal > 65 Keep euvolemic  Cont current milrinone dosing    Acute respiratory failure with hypoxia  Combined pre and post capillary PH  -Was on revatio and opsumit.  Plan Can cont to reassess but limited evidence that resuming this in a pt w. Valve related post-capillary PH will benefit  ESRD on HD Thrombosed AVF Plan CRRT per nephro Eventually needs [facility] when more stable->vascular surg following   HLD DM2 Plan Ssi for goal glucose 140-180 Cont levemir and current tubefeed coverage   Acute blood loss anemia Suspect secondary to surgery, frequent CRRT circuit changes, and chronic kidney disease. CBC stable  Plan Transfusion trigger <7 Trend cbc   Hx PE - ?CTEPH Thrombocytopenia - suspect CRRT related HITT panel 0.075. SRA negative.  Plan Cont Bivalirudin for ease of keeping therapeutic  Eventually transition back to oral [facility]    Best Practice (right click and "Reselect all SmartList Selections" daily)   Diet/type: tubefeeds   DVT prophylaxis: other- on bivalirudin GI prophylaxis: PPI Lines: Central line,  Dialysis Catheter, Arterial Line, and yes and it is still needed  Foley:  N/A Code Status:  full code Last date of multidisciplinary goals of care discussion [Family updated 2/18]   Critical care time: 32 min   Erick Colace ACNP-BC [place] Pager # [phone] OR # [phone] if no answer   I have taken an interval history, reviewed the chart and examined the patient. I agree with the Advanced Practitioner's note, impression, and recommendations as outlined.   Briefly, 66 year old female with ESRD, pulmonary hypertension and history of pulmonary embolism. Initially presented on 01/12/2022 with chest pain. Underwent cardiac cath and CABG complicated by vasoplegic shock. Hospital course also complicated by cardiogenic shock and bacteremia. Lines were removed and replaced.  S: On epi and milrinone and CRRT with volume removal  Blood pressure 132/76, pulse (!) 113, temperature (!) 94.4 F (34.7 C), temperature source Axillary, resp. rate 15, height 4' 11.5" (1.511 m), weight 66.9 kg, SpO2 93 %. On exam, obese chronically ill appearing female, appears comfortable. On nasal cannula. Lungs with diminished breath sounds bilaterally. Heart RRR, no murmur. Lower extremities with 1+ pitting edema, warm to touch  Labs and imaging  reviewed.  Cardiogenic shock in setting of acute on chronic combined heart failure Septic shock secondary morganella morganii bacteremia --Maintain MAP >65 - wean levophed gtt --Continue inotropic support: milrinone and epi. Trend co-ox --CTS following --Continue antibiotics  Acute hypoxemic respiratory failure PH --Wean supplemental O2. BiPAP PRN --Volume removal per CRRT --No indications to start [place] meds with current oxygen needs  CAD w/ TR  s/p CABG x3 and TVR --On bivalirudin  The patient is critically ill with multiple organ systems failure and requires high complexity decision making for assessment and support, frequent evaluation and  titration of therapies, application of advanced monitoring technologies and extensive interpretation of multiple databases.  Independent Critical Care Time: 40 Minutes.   Rodman Pickle, M.D. [facility] Pulmonary/Critical Care Medicine 01/16/2022 1:40 PM   Please see Amion for pager number to reach on-call Pulmonary and Critical Care Team.

## 2022-01-16 NOTE — Progress Notes (Signed)
RT noticed on monitor SPo2 reading 58%, went to bedside, RN at bedside. Pt not in distress and SPo2 pleth decent. Pt does not tolerate BIPAP well and was on 2L. RT drew ABG to see if SPo2 correlated. SPo2 on ABG was 98% when monitor was reading 60's after multiple attempts at changing to a different probe to obtain accurate Spo2. RN wiped forehead, moved probe to a different spot on forehead and SPo2 read 100%. Pt remains on 2L and tolerating well.

## 2022-01-17 ENCOUNTER — Inpatient Hospital Stay (HOSPITAL_COMMUNITY): Payer: Medicare Other

## 2022-01-17 DIAGNOSIS — I5042 Chronic combined systolic (congestive) and diastolic (congestive) heart failure: Secondary | ICD-10-CM | POA: Diagnosis not present

## 2022-01-17 DIAGNOSIS — Z951 Presence of aortocoronary bypass graft: Secondary | ICD-10-CM

## 2022-01-17 DIAGNOSIS — R072 Precordial pain: Secondary | ICD-10-CM | POA: Diagnosis not present

## 2022-01-17 DIAGNOSIS — R570 Cardiogenic shock: Secondary | ICD-10-CM | POA: Diagnosis not present

## 2022-01-17 LAB — POCT I-STAT 7, (LYTES, BLD GAS, ICA,H+H)
Acid-Base Excess: 0 mmol/L (ref 0.0–2.0)
Bicarbonate: 24.9 mmol/L (ref 20.0–28.0)
Calcium, Ion: 1.39 mmol/L (ref 1.15–1.40)
HCT: 25 % — ABNORMAL LOW (ref 36.0–46.0)
Hemoglobin: 8.5 g/dL — ABNORMAL LOW (ref 12.0–15.0)
O2 Saturation: 99 %
Patient temperature: 97.3
Potassium: 4.8 mmol/L (ref 3.5–5.1)
Sodium: 137 mmol/L (ref 135–145)
TCO2: 26 mmol/L (ref 22–32)
pCO2 arterial: 39.9 mmHg (ref 32–48)
pH, Arterial: 7.4 (ref 7.35–7.45)
pO2, Arterial: 155 mmHg — ABNORMAL HIGH (ref 83–108)

## 2022-01-17 LAB — CBC WITH DIFFERENTIAL/PLATELET
Abs Immature Granulocytes: 0.36 10*3/uL — ABNORMAL HIGH (ref 0.00–0.07)
Basophils Absolute: 0.1 10*3/uL (ref 0.0–0.1)
Basophils Relative: 0 %
Eosinophils Absolute: 0.2 10*3/uL (ref 0.0–0.5)
Eosinophils Relative: 1 %
HCT: 24.3 % — ABNORMAL LOW (ref 36.0–46.0)
Hemoglobin: 7.1 g/dL — ABNORMAL LOW (ref 12.0–15.0)
Immature Granulocytes: 2 %
Lymphocytes Relative: 3 %
Lymphs Abs: 0.6 10*3/uL — ABNORMAL LOW (ref 0.7–4.0)
MCH: 28.6 pg (ref 26.0–34.0)
MCHC: 29.2 g/dL — ABNORMAL LOW (ref 30.0–36.0)
MCV: 98 fL (ref 80.0–100.0)
Monocytes Absolute: 1.9 10*3/uL — ABNORMAL HIGH (ref 0.1–1.0)
Monocytes Relative: 9 %
Neutro Abs: 17.3 10*3/uL — ABNORMAL HIGH (ref 1.7–7.7)
Neutrophils Relative %: 85 %
Platelets: 175 10*3/uL (ref 150–400)
RBC: 2.48 MIL/uL — ABNORMAL LOW (ref 3.87–5.11)
RDW: 22.1 % — ABNORMAL HIGH (ref 11.5–15.5)
WBC: 20.4 10*3/uL — ABNORMAL HIGH (ref 4.0–10.5)
nRBC: 17.4 % — ABNORMAL HIGH (ref 0.0–0.2)

## 2022-01-17 LAB — BASIC METABOLIC PANEL
Anion gap: 9 (ref 5–15)
BUN: 33 mg/dL — ABNORMAL HIGH (ref 8–23)
CO2: 25 mmol/L (ref 22–32)
Calcium: 9.2 mg/dL (ref 8.9–10.3)
Chloride: 102 mmol/L (ref 98–111)
Creatinine, Ser: 2.2 mg/dL — ABNORMAL HIGH (ref 0.44–1.00)
GFR, Estimated: 24 mL/min — ABNORMAL LOW (ref >60)
Glucose, Bld: 353 mg/dL — ABNORMAL HIGH (ref 70–99)
Potassium: 4.9 mmol/L (ref 3.5–5.1)
Sodium: 136 mmol/L (ref 135–145)

## 2022-01-17 LAB — GLUCOSE, CAPILLARY
Glucose-Capillary: 104 mg/dL — ABNORMAL HIGH (ref 70–99)
Glucose-Capillary: 111 mg/dL — ABNORMAL HIGH (ref 70–99)
Glucose-Capillary: 116 mg/dL — ABNORMAL HIGH (ref 70–99)
Glucose-Capillary: 163 mg/dL — ABNORMAL HIGH (ref 70–99)
Glucose-Capillary: 225 mg/dL — ABNORMAL HIGH (ref 70–99)
Glucose-Capillary: 250 mg/dL — ABNORMAL HIGH (ref 70–99)
Glucose-Capillary: 262 mg/dL — ABNORMAL HIGH (ref 70–99)
Glucose-Capillary: 300 mg/dL — ABNORMAL HIGH (ref 70–99)
Glucose-Capillary: 324 mg/dL — ABNORMAL HIGH (ref 70–99)

## 2022-01-17 LAB — COOXEMETRY PANEL
Carboxyhemoglobin: 1.6 % — ABNORMAL HIGH (ref 0.5–1.5)
Methemoglobin: 0.8 % (ref 0.0–1.5)
O2 Saturation: 70.6 %
Total hemoglobin: <6.9 g/dL — CL (ref 12.0–16.0)

## 2022-01-17 LAB — RENAL FUNCTION PANEL
Albumin: 2.5 g/dL — ABNORMAL LOW (ref 3.5–5.0)
Anion gap: 11 (ref 5–15)
BUN: 43 mg/dL — ABNORMAL HIGH (ref 8–23)
CO2: 23 mmol/L (ref 22–32)
Calcium: 9.4 mg/dL (ref 8.9–10.3)
Chloride: 102 mmol/L (ref 98–111)
Creatinine, Ser: 2.99 mg/dL — ABNORMAL HIGH (ref 0.44–1.00)
GFR, Estimated: 17 mL/min — ABNORMAL LOW (ref >60)
Glucose, Bld: 182 mg/dL — ABNORMAL HIGH (ref 70–99)
Phosphorus: 3.1 mg/dL (ref 2.5–4.6)
Potassium: 4.8 mmol/L (ref 3.5–5.1)
Sodium: 136 mmol/L (ref 135–145)

## 2022-01-17 LAB — CULTURE, BLOOD (ROUTINE X 2): Special Requests: ADEQUATE

## 2022-01-17 LAB — AMMONIA: Ammonia: 22 umol/L (ref 9–35)

## 2022-01-17 LAB — APTT: aPTT: 60 seconds — ABNORMAL HIGH (ref 24–36)

## 2022-01-17 LAB — PROCALCITONIN: Procalcitonin: 3.74 ng/mL

## 2022-01-17 LAB — CORTISOL: Cortisol, Plasma: 29.9 ug/dL

## 2022-01-17 LAB — MAGNESIUM: Magnesium: 2.9 mg/dL — ABNORMAL HIGH (ref 1.7–2.4)

## 2022-01-17 MED ORDER — INSULIN ASPART 100 UNIT/ML IJ SOLN
0.0000 [IU] | INTRAMUSCULAR | Status: DC
  Administered 2022-01-17: 7 [IU] via SUBCUTANEOUS
  Administered 2022-01-17: 4 [IU] via SUBCUTANEOUS
  Administered 2022-01-17: 12:00:00 7 [IU] via SUBCUTANEOUS
  Administered 2022-01-18 (×2): 4 [IU] via SUBCUTANEOUS
  Administered 2022-01-18: 12:00:00 11 [IU] via SUBCUTANEOUS
  Administered 2022-01-18 – 2022-01-19 (×2): 7 [IU] via SUBCUTANEOUS
  Administered 2022-01-19 (×2): 4 [IU] via SUBCUTANEOUS
  Administered 2022-01-19: 3 [IU] via SUBCUTANEOUS
  Administered 2022-01-20 (×2): 4 [IU] via SUBCUTANEOUS
  Administered 2022-01-20: 3 [IU] via SUBCUTANEOUS
  Administered 2022-01-20: 4 [IU] via SUBCUTANEOUS
  Administered 2022-01-20 (×2): 3 [IU] via SUBCUTANEOUS
  Administered 2022-01-21: 05:00:00 4 [IU] via SUBCUTANEOUS
  Administered 2022-01-21 (×2): 7 [IU] via SUBCUTANEOUS
  Administered 2022-01-22: 16:00:00 4 [IU] via SUBCUTANEOUS
  Administered 2022-01-22 (×2): 3 [IU] via SUBCUTANEOUS
  Administered 2022-01-22: 4 [IU] via SUBCUTANEOUS
  Administered 2022-01-22 (×2): 3 [IU] via SUBCUTANEOUS
  Administered 2022-01-23: 08:00:00 4 [IU] via SUBCUTANEOUS
  Administered 2022-01-23: 04:00:00 3 [IU] via SUBCUTANEOUS
  Administered 2022-01-23 (×2): 4 [IU] via SUBCUTANEOUS
  Administered 2022-01-23 – 2022-01-24 (×4): 3 [IU] via SUBCUTANEOUS
  Administered 2022-01-25 (×3): 4 [IU] via SUBCUTANEOUS
  Administered 2022-01-25: 7 [IU] via SUBCUTANEOUS
  Administered 2022-01-26 (×2): 3 [IU] via SUBCUTANEOUS
  Administered 2022-01-26: 4 [IU] via SUBCUTANEOUS
  Administered 2022-01-26: 7 [IU] via SUBCUTANEOUS
  Administered 2022-01-26: 4 [IU] via SUBCUTANEOUS
  Administered 2022-01-26: 3 [IU] via SUBCUTANEOUS
  Administered 2022-01-27: 7 [IU] via SUBCUTANEOUS
  Administered 2022-01-27: 3 [IU] via SUBCUTANEOUS
  Administered 2022-01-28 (×2): 4 [IU] via SUBCUTANEOUS
  Administered 2022-01-28 (×3): 3 [IU] via SUBCUTANEOUS
  Administered 2022-01-28: 4 [IU] via SUBCUTANEOUS
  Administered 2022-01-29: 7 [IU] via SUBCUTANEOUS
  Administered 2022-01-29: 3 [IU] via SUBCUTANEOUS
  Administered 2022-01-29: 7 [IU] via SUBCUTANEOUS
  Administered 2022-01-29 – 2022-01-30 (×3): 3 [IU] via SUBCUTANEOUS
  Administered 2022-01-31 (×4): 4 [IU] via SUBCUTANEOUS
  Administered 2022-02-01 (×4): 3 [IU] via SUBCUTANEOUS
  Administered 2022-02-01: 4 [IU] via SUBCUTANEOUS
  Administered 2022-02-02 (×2): 3 [IU] via SUBCUTANEOUS
  Administered 2022-02-02 – 2022-02-03 (×4): 4 [IU] via SUBCUTANEOUS
  Administered 2022-02-04: 7 [IU] via SUBCUTANEOUS
  Administered 2022-02-04: 4 [IU] via SUBCUTANEOUS
  Administered 2022-02-04: 7 [IU] via SUBCUTANEOUS
  Administered 2022-02-04: 4 [IU] via SUBCUTANEOUS

## 2022-01-17 MED ORDER — INSULIN DETEMIR 100 UNIT/ML ~~LOC~~ SOLN
20.0000 [IU] | Freq: Two times a day (BID) | SUBCUTANEOUS | Status: AC
  Administered 2022-01-17 – 2022-01-24 (×15): 20 [IU] via SUBCUTANEOUS
  Filled 2022-01-17 (×18): qty 0.2

## 2022-01-17 MED ORDER — FLUCONAZOLE 100MG IVPB
150.0000 mg | Freq: Once | INTRAVENOUS | Status: AC
  Administered 2022-01-17: 150 mg via INTRAVENOUS
  Filled 2022-01-17: qty 75

## 2022-01-17 MED ORDER — INSULIN ASPART 100 UNIT/ML IJ SOLN
4.0000 [IU] | INTRAMUSCULAR | Status: DC
  Administered 2022-01-17 – 2022-01-21 (×25): 4 [IU] via SUBCUTANEOUS

## 2022-01-17 MED ORDER — PIPERACILLIN-TAZOBACTAM IN DEX 2-0.25 GM/50ML IV SOLN
2.2500 g | Freq: Three times a day (TID) | INTRAVENOUS | Status: DC
  Administered 2022-01-17 – 2022-01-18 (×2): 2.25 g via INTRAVENOUS
  Filled 2022-01-17 (×2): qty 50

## 2022-01-17 MED FILL — Thrombin-Sodium Carboxymethylcellulose-CaCl Pad 3" X 3": CUTANEOUS | Qty: 1 | Status: AC

## 2022-01-17 NOTE — Progress Notes (Signed)
[facility] Progress Note Patient Name: Jody Nguyen DOB: [birth year] MRN: [id number]   Date of Service  01/17/2022  HPI/Events of Note  Patient in AFIB with ventricular rate = 110's to 140's. Currently on Norepinephrine, Epinephrine and Vasopressin IV infusion. Difficult situation. Vasopressor requirement contraindicates B-Blocker and Ca++ Channel Blocker use. History of prolonged QTc interval makes Amiodarone Rx difficult. Already on Angiomax. ESRD last K+ = 4.8 and last Mg++ = 2.9.  eICU Interventions  Plan: Try to wean Epinephrine and Norepinephrine IV infusions as tolerated. If HR sustains in the 130 - 140 range, may need to bolus with Amiodarone.      Intervention Category Major Interventions: Arrhythmia - evaluation and management  [name] 01/17/2022, 9:07 PM

## 2022-01-17 NOTE — Progress Notes (Signed)
Pharmacy Antibiotic Note  Jody Nguyen is a 66 y.o. female admitted on 01/05/2022 with Morganella bacteremia and Pharmacy has been consulted for and zosyn dosing.  Patient is off CRRT and SCr increased to 2.99, CrCL 15 ml/min, afebrile, WBC 20.4.  Plan: Change Zosyn to 2.25gm IV Q8H Monitor renal fxn, dialysis plans, clinical progress  Height: 4' 11.5" (151.1 cm) Weight: 66.9 kg (147 lb 7.8 oz) IBW/kg (Calculated) : 44.35  Temp (24hrs), Avg:98.5 F (36.9 C), Min:97.3 F (36.3 C), Max:99.4 F (37.4 C)  Recent Labs  Lab 01/13/22 1646 01/13/22 2358 01/14/22 0458 01/14/22 0729 01/15/22 0401 01/15/22 1600 01/16/22 0340 01/16/22 1158 01/16/22 1612 01/17/22 0348 01/17/22 1634  WBC  --  25.9* 25.9*  --  23.6*  --  21.4*  --   --  20.4*  --   CREATININE  --   --   --    < > 1.51* 1.30* 1.33*  --  1.42* 2.20* 2.99*  LATICACIDVEN 1.4  --   --   --   --   --   --  1.3  --   --   --    < > = values in this interval not displayed.     Estimated Creatinine Clearance: 15.6 mL/min (A) (by C-G formula based on SCr of 2.99 mg/dL (H)).    Allergies  Allergen Reactions   Minoxidil Other (See Comments)    Pericardial effusion   Lipitor [Atorvastatin] Other (See Comments)    MYALGIAS > "pain in legs" Tolerates rosuvastatin   Morphine And Related Itching   Ace Inhibitors Other (See Comments)    REACTION: "not sure...think it made me drowsy all the time"   Vancomycin 2/17 > 2/18 Zosyn 2/17 >  2/17 BCx x 2 > morganella - S to zosyn 2/17 MRSA PCR neg  [name] D. Mina Marble, PharmD, BCPS, [place] 01/17/2022, 8:42 PM

## 2022-01-17 NOTE — Progress Notes (Signed)
[facility] Progress Note  Name: Jody Nguyen MRN: [id number] DOB: December 16, 1955   Subjective: I/O net neg 1 L yest w/ large GI losses.    Intake/Output Summary (Last 24 hours) at 01/17/2022 1156 Last data filed at 01/17/2022 1100 Gross per 24 hour  Intake 2324.37 ml  Output 1679 ml  Net 645.37 ml     Vitals:  Vitals:   01/17/22 1015 01/17/22 1030 01/17/22 1045 01/17/22 1100  BP:      Pulse: (!) 127 (!) 127 (!) 126 (!) 125  Resp: (!) 23 (!) 30 (!) 26 18  Temp:      TempSrc:      SpO2: 95% 94% 93% 91%  Weight:      Height:         Physical Exam:  General: tired appearing, NAD Lungs: no ^wob Heart S1S2  Abdomen soft obese Extremities: minimal LE , LUE edema, +RUE edema Neuro : awake, tired, responsive Access: right arm AVF in place, no bruit or thrill/+swollen; right fem temp HD line c/d/i  Medications reviewed   Labs:  BMP Latest Ref Rng & Units 01/17/2022 01/17/2022 01/16/2022  Glucose 70 - 99 mg/dL - 353(H) 274(H)  BUN 8 - 23 mg/dL - 33(H) 21  Creatinine 0.44 - 1.00 mg/dL - 2.20(H) 1.42(H)  BUN/Creat Ratio 6 - 22 (calc) - - -  Sodium 135 - 145 mmol/L 137 136 137  Potassium 3.5 - 5.1 mmol/L 4.8 4.9 4.9  Chloride 98 - 111 mmol/L - 102 102  CO2 22 - 32 mmol/L - 25 24  Calcium 8.9 - 10.3 mg/dL - 9.2 8.7(L)   OP HD: MWF [place] 3:15h   450/A1.5   75.5kg   2K/2Ca bath  P2   AVF  Hep  2500 - Hectoral 83mcg IV q HD - Sensipar 90mg  PO q HD - Mircera 131mcg IV q 2 weeks (ordered, not yet given. Last Mircera 100 on 12/07/21)   Assessment/Plan:   CAD s/p CABG x3, TR sp TVR:  per CT surgery   ESRD:  normally MWF schedule, last IHD Thursday 2/09. Remains on pressors x 3. CRRT startedo n 2/11. Trial of holding CRRT underway to see if ^volume improves the hemodynamics.  Remains well dialyzed and probably can go another 1-2 days off CRRT.  Infiltrated AVF - AVF infiltrated on 2/7. Expert cannulator able to stick 2/09 and completed HD pre CABG. Post-op AVF is  nonfunctional, VVS following. Planning for [facility] when more stable.  Hypotension - epi drip almost off, remains on vaso/ levo and milrinone. Pressor support per primary service Leukocytosis-per pmd, on IV zosyn Thrombycytopenia- resolved Volume: hx of HTN, remains in shock here. Is below her dry wt 8kg. May be intravasc dry. Holding CRRT another 1-2 days as trial to see if pressors can be weaned down.   Anemia CKD : s/p PRBC's , Hb 8.5 transfuse prn, esa ordered to start 6/14  Metabolic bone disease: resume home binders and sensipar when taking PO. Cont IV vdra.   Pulm HTN:  Followed at [facility], on Revatio and opsumit  T2DM-per primary service  Hx of MV replacement - in 2016  Sol Blazing, MD 01/17/2022 11:56 AM

## 2022-01-17 NOTE — Progress Notes (Signed)
Physical Therapy Treatment Patient Details Name: Jody Nguyen MRN: [id number] DOB: 1956/11/23 Today's Date: 01/17/2022   History of Present Illness The pt is a 66 yo female presenting 2/5 with recurrent chest pain. Pt now s/p CABG x3 and tricuspid valve annuloplasty on 2/10 requiring 6 units PRBCs, 2 units FFP, and 2 bags of platelets and resulting in vasoplegic shock with very high vasopressor requirements. Started on CRRT 2/11, extubated 2/16. PMH includes: anemia, CHF, CKD IV on HD MWF, CVA, DM II, HLD, HTN, pulmonary HTN.    PT Comments    Pt oriented to self only; continuously asking to get out of bed and go home despite re-orientation. Able to follow one step commands with max multimodal cues and could not perform functional tasks I.e. washing face. Session focused on bed level exercises and tilting to promote BLE weightbearing. Pt tolerated ~30 degree tilt for 15 minutes; further tilt deferred in setting of soft BP (MAP in low 60's), HR 121-126, SpO2 > 90% on 5L O2.   Recommendations for follow up therapy are one component of a multi-disciplinary discharge planning process, led by the attending physician.  Recommendations may be updated based on patient status, additional functional criteria and insurance authorization.  Follow Up Recommendations  Acute inpatient rehab (3hours/day)     Assistance Recommended at Discharge Frequent or constant Supervision/Assistance  Patient can return home with the following Two people to help with walking and/or transfers;Two people to help with bathing/dressing/bathroom;Assistance with cooking/housework;Help with stairs or ramp for entrance;Direct supervision/assist for medications management;Direct supervision/assist for financial management;Assist for transportation   Equipment Recommendations  Other (comment) (TBA)    Recommendations for Other Services       Precautions / Restrictions Precautions Precautions: Fall;Sternal Precaution  Comments: bilateral femoral catheters, flexiseal, cortrack, rectal tube, watch BP Restrictions Weight Bearing Restrictions: Yes Other Position/Activity Restrictions: sternal precautions     Mobility  Bed Mobility Overal bed mobility: Needs Assistance Bed Mobility: Rolling Rolling: Max assist         General bed mobility comments: Tilt x 15 minutes to 30 degrees, maxA for rolling for pillow prop    Transfers                   General transfer comment: unable due to groin lines    Ambulation/Gait                   Stairs             Wheelchair Mobility    Modified Rankin (Stroke Patients Only)       Balance                                            Cognition Arousal/Alertness: Awake/alert Behavior During Therapy: Restless Overall Cognitive Status: Impaired/Different from baseline Area of Impairment: Attention, Following commands, Safety/judgement, Awareness, Problem solving, Orientation                 Orientation Level: Disoriented to, Place, Time, Situation Current Attention Level: Focused   Following Commands: Follows one step commands inconsistently, Follows one step commands with increased time Safety/Judgement: Decreased awareness of safety, Decreased awareness of deficits Awareness: Intellectual Problem Solving: Slow processing, Decreased initiation, Difficulty sequencing, Requires verbal cues General Comments: Pt states she is at [employer], continously asking when she can go home and if she can get out of  bed despite re-orientation. Able to follow 1 step commands with max multimodal cues but unable to complete functional tasks such as reaching and grabbing for objects or washing face        Exercises General Exercises - Upper Extremity Shoulder Flexion: AAROM, Both, 10 reps, Supine (to 90) Elbow Flexion: AAROM, Both, 10 reps, Supine Digit Composite Flexion: AAROM, Both, 10 reps, Supine General  Exercises - Lower Extremity Ankle Circles/Pumps: Both, 10 reps, Supine    General Comments        Pertinent Vitals/Pain Pain Assessment Pain Assessment: Faces Faces Pain Scale: Hurts little more Pain Location: neck with ROM, toes with palpation, generalized Pain Descriptors / Indicators: Grimacing Pain Intervention(s): Monitored during session    Home Living                          Prior Function            PT Goals (current goals can now be found in the care plan section) Acute Rehab PT Goals Patient Stated Goal: to get stronger Potential to Achieve Goals: Good Progress towards PT goals: Progressing toward goals    Frequency    Min 3X/week      PT Plan Current plan remains appropriate    Co-evaluation              AM-PAC PT "6 Clicks" Mobility   Outcome Measure  Help needed turning from your back to your side while in a flat bed without using bedrails?: Total Help needed moving from lying on your back to sitting on the side of a flat bed without using bedrails?: Total Help needed moving to and from a bed to a chair (including a wheelchair)?: Total Help needed standing up from a chair using your arms (e.g., wheelchair or bedside chair)?: Total Help needed to walk in hospital room?: Total Help needed climbing 3-5 steps with a railing? : Total 6 Click Score: 6    End of Session Equipment Utilized During Treatment: Oxygen Activity Tolerance: Other (comment) (limited due to low BP) Patient left: in bed;with call bell/phone within reach;with nursing/sitter in room;with family/visitor present Nurse Communication: Mobility status PT Visit Diagnosis: Unsteadiness on feet (R26.81);Other abnormalities of gait and mobility (R26.89);Muscle weakness (generalized) (M62.81)     Time: 2876-8115 PT Time Calculation (min) (ACUTE ONLY): 34 min  Charges:  $Therapeutic Exercise: 8-22 mins $Therapeutic Activity: 8-22 mins                     Wyona Almas, PT, DPT Acute Rehabilitation Services Pager [phone] Office [phone]    [name] 01/17/2022, 1:19 PM

## 2022-01-17 NOTE — Plan of Care (Signed)
  Problem: Nutrition: Goal: Adequate nutrition will be maintained Outcome: Progressing   Problem: Coping: Goal: Level of anxiety will decrease Outcome: Progressing   Problem: Skin Integrity: Goal: Risk for impaired skin integrity will decrease Outcome: Progressing   

## 2022-01-17 NOTE — Progress Notes (Signed)
SLP Cancellation Note  Patient Details Name: TRESSA MALDONADO MRN: [id number] DOB: 04-05-1956   Cancelled treatment:       Reason Eval/Treat Not Completed: Fatigue/lethargy limiting ability to participate. SLP will follow along for readiness for FEES.  Talibah Colasurdo L. Tivis Ringer, [place] Office number [phone] Pager [phone]    Assunta Curtis 01/17/2022, 3:52 PM

## 2022-01-17 NOTE — Progress Notes (Signed)
° °   °  [address]       [place] [address]             [phone]    BP 109/61    Pulse (!) 121    Temp 97.7 F (36.5 C) (Oral)    Resp 13    Ht 4' 11.5" (1.511 m)    Wt 66.9 kg    SpO2 95%    BMI 29.29 kg/m  Norepi up to 25, epi @1 , vasopressin 0.03 Milrinone 0.25  1399/125 so far today  Off CVVHD for now  Titrate drips as needed  Remo Lipps C. Roxan Hockey, MD [facility] [phone]

## 2022-01-17 NOTE — Progress Notes (Addendum)
NAME:  Jody Nguyen, MRN:  [id number], DOB:  [birth year], LOS: 60 ADMISSION DATE:  01/22/2022, CONSULTATION DATE:  01/24/2022 REFERRING MD:  Martie Lee, TRH, CHIEF COMPLAINT:  SOB   History of Present Illness:  66 yo F PMH ESRD, non-obstructive CAD, suspected pulmonary hypertension, Hx PE on eliquis who presented to [facility] ED 2/4 with chest pain, which radiates to arm. Similar sx had also promoted ED presentation on 2/1 at which time trops were 100. She was seen by cardiology outpt on 2/2 and chest pain was felt to be related to volume shift as pain had occurred during dialysis.   On 2/4 presentation, Pt states that episodic chest pain has been intermittent for the past week. Non-exertional, lasts approx 10 minutes. Associated nausea and SOB. Non-specific EKG changes  & was d/w cardiology, ruled out for STEMI. Trop 200. Placed on [place] for SpO2 80s.Admitted to [facility] for obvs with concern for PE and started on heparin gtt. Awaiting VQ scan. ECHO has been performed which reveals Severe RV RA enlargement, severely reduced RV function and septal bowing, felt consistent with chronic process such as severe pulmonary HTN.    PCCM has been consulted in this setting. She remains critically ill.   Pertinent  Medical History  ESRD PE Pulmonary HTN  CAD CVA HLD HTN S/p MV repair  Pericardial effusion r/t minoxidil  Diastolic HF DM2  Significant Hospital Events: Including procedures, antibiotic start and stop dates in addition to other pertinent events   2/4 ED presentation with recurring chest pain -- STEMI ruled out. Stable, mildly hypoxic and placed on [place] 2/5 admit to [facility] for obvs. Concern for PE, ECHO performed by cardiology reportedly shows RV failure, but felt more consistent with chronic process like severe pulmHTN. Awaiting VQ scan. Pulm consulted  2/7 cardiac cath. 3 vessel dz. CABG rec 2/10 CABG, TVR 2/11 continued vasoplegic shock with very high vasopressor requirements  2/12 Given methylene blue  x 2 2/13 chest tube removed 2/14 JP removed 2/15 pacing wires removed 2/16 milrinone stopped, extubated 2/17 zosyn for GNR bacteremia-> Morganella morganii 2/18 arterial line, central line, vascath all removed and replaced. 2/19 milrinone back on. Co-ox improved. ECHO Left ventricular ejection fraction, by estimation, is 50 to 55%. The  left ventricle has low normal function. No regional WM abnormality. Right Ventricle: Ventricular septum is flattened in systole and diastole  suggesting RV pressure and volume overload. The right ventricular size is  severely enlarged. Right vetricular wall thickness was not well  visualized. Right ventricular systolic  function is severely reduced. There is moderately elevated pulmonary  artery systolic pressure 2/24 still high pressor needs. CRRT stopped. Having intermittent bouts of tachycardia 2/21 Hemodynamics better. Weaning Epi. Still on Norepi and inotropic support. Supported on BIPAP over night and tolerated well. On am rounds more lethargic w/ generalized pain everywhere. Co-ox > 70. Glucose poorly controlled.   Interim History / Subjective:  Encephalopathic this am   Objective   Blood pressure 109/61, pulse (Abnormal) 121, temperature (Abnormal) 97.3 F (36.3 C), temperature source Oral, resp. rate 16, height 4' 11.5" (1.511 m), weight 66.9 kg, SpO2 100 %. CVP:  [13 mmHg-24 mmHg] 17 mmHg  Vent Mode: BIPAP;PCV FiO2 (%):  [40 %-50 %] 40 % Set Rate:  [14 bmp] 14 bmp PEEP:  [5 cmH20] 5 cmH20 Plateau Pressure:  [5 cmH20] 5 cmH20   Intake/Output Summary (Last 24 hours) at 01/17/2022 0833 Last data filed at 01/17/2022 0600 Gross per 24 hour  Intake 1679.25 ml  Output 1679 ml  Net 0.25 ml   Filed Weights   01/15/22 0615 01/16/22 0630 01/17/22 0403  Weight: 69.5 kg 66.9 kg 66.9 kg    Examination: General obese debilitated female. Remains critically ill. Hurts everywhere  HENT NCAT no JVD MM dry today  Pulm clear. Pulse ox 99% on 5 lpm. No  accessory use. PCXR w/out much in way of change  Card tachy rrr Abd soft but tender (non-specific) Ext dependent edema . Pulses are palp  Neuro awake,. Confused and more lethargic today. Does f/c. Pain everywhere   Assessment & Plan:  Principal Problem:   Chest pain, Multivessel CAD Active Problems:   Coronary artery disease involving native coronary artery of native heart with unstable angina pectoris ([place])   Pulmonary hypertension (HCC)   Hx of CABG   S/P TVR (tricuspid valve repair)   Hyperlipidemia associated with type 2 diabetes mellitus (HCC)   Anemia of chronic renal failure   Type 2 diabetes mellitus (HCC)   Obesity (BMI 30-39.9)   Chronic combined systolic and diastolic heart failure (HCC)   End stage renal disease on dialysis (HCC)   History of stroke   QT prolongation   SVT (supraventricular tachycardia) (HCC)   Hyponatremia   GERD (gastroesophageal reflux disease)   Septic shock (HCC)   Gram-negative bacteremia   Cardiogenic shock (HCC)   Tachycardia   CAD w/ TR  s/p CABG x3 and TVR on 2/10. Complicated by Acute on chronic systolic/diastolic CHF and Mixed cardiogenic and septic shock w/ morganella morganii bacteremia  -TCTS following appreciate assistance and management -co-ox improved since yesterday  Plan Day 5 of 10 zosyn for bacteremia  Cont to wean epi (first) then vasopressin then norepi, goal MAP >65 Keep euvolemic Cont milrinone Repeat random cort (last one was done on steroids) Daily co-ox  Repeat PCT. If fairly elevated perhaps we are missing another infection not identified?   Acute respiratory failure with hypoxia  Combined pre and post capillary PH  -Was on revatio and opsumit.  Plan Cont supplemental oxygen  HS BIPAP  IS when and if able Holding her opsumit not sure we need to resume this PRN CXR  ESRD on HD Thrombosed AVF Plan On CRRT holiday May need to resume in next day or so, Not sure she can tolerate iHD Eventually needs  tunneled cath but needs to be more stable  Am chem   Acute metabolic encephalopathy Worse over night. Abg reviewed. Looks pretty good. This is not acidosis or hypercarbia related. ? ICU delirium, ? Sepsis  Plan  Cont supportive care Ck ammonia level   HLD DM2 w/ hyperglycemia  Plan Changing ssi to resistant scale Increase Levemir to 20 units bid Add tube feed coverage   Acute blood loss anemia Suspect secondary to surgery, frequent CRRT circuit changes, and chronic kidney disease. CBC stable  Plan Trigger for transfusion < 7 Daily cbc   Hx PE - ?CTEPH Thrombocytopenia - suspect CRRT related HITT panel 0.075. SRA negative.  Plan Cont bivalirudin for ease of keeping therapeutic  Eventually back on oral rx     Best Practice (right click and "Reselect all SmartList Selections" daily)   Diet/type: tubefeeds   DVT prophylaxis: other- on bivalirudin GI prophylaxis: PPI Lines: Central line, Dialysis Catheter, Arterial Line, and yes and it is still needed  Foley:  N/A Code Status:  full code Last date of multidisciplinary goals of care discussion [Family updated 2/18]   Critical care time: 35 minutes including plan  above as well as discussing plan of care w/ nursing staff and thoracic surg    Erick Colace ACNP-BC [place] Pager # [phone] OR # [phone] if no answer   I have taken an interval history, reviewed the chart and examined the patient. I agree with the Advanced Practitioner's note, impression, and recommendations as outlined.   Briefly, 66 year old female with ESRD, PH, and hx of PE. Initially presented on 01/18/2022 with chest pain. Underwent cardiac cath and CABG complicated by vasoplegic shock. Hospital course also complicated by cardiogenic shock and bacteremia. Lines were removed and replaced.  S: Borderline low-normal pressures on inotropic and vasopressor support. Patient moaning. Brother at bedside. CRRT discontinued per Nephrology  yesterday.  Blood pressure 109/61, pulse (!) 121, temperature 98.4 F (36.9 C), temperature source Oral, resp. rate 13, height 4' 11.5" (1.511 m), weight 66.9 kg, SpO2 95 %. On exam, critically ill appearing patient on multiple pressors. On . Appears uncomfortable and moaning. Does not follow commands. Lungs diminished breath sounds bilaterally, no wheezing or rhonchi. Lower extremities no significant edema, warm to touch  WBC 20, slowly improving but elevated  CXR 01/17/22 un changed small left pleural effusion with atelectasis/infiltrate. Cardiomegaly  Cardiogenic shock in setting of acute on chronic combined heart failure Septic shock secondary morganella morganii bacteremia --Maintain MAP >65 - unable to wean levophed --Continue inotropic support: milrinone and epi. Trend co-ox --Appreciate CTS support --Continue antibiotics   Acute hypoxemic respiratory failure PH --Wean supplemental O2. BiPAP PRN --No indications to start [place] meds with current oxygen needs   CAD w/ TR  s/p CABG x3 and TVR --On bivalirudin  Will need to consider palliative care if she continues to require ongoing aggressive support  The patient is critically ill with multiple organ systems failure and requires high complexity decision making for assessment and support, frequent evaluation and titration of therapies, application of advanced monitoring technologies and extensive interpretation of multiple databases.  Independent Critical Care Time: 46 Minutes.   Rodman Pickle, M.D. [facility] Pulmonary/Critical Care Medicine 01/17/2022 4:02 PM   Please see Amion for pager number to reach on-call Pulmonary and Critical Care Team.

## 2022-01-17 NOTE — Progress Notes (Signed)
11 Days Post-Op Procedure(s) (LRB): CORONARY ARTERY BYPASS GRAFTING (CABG) TIMES THREE ON CARDIOPULMONARY BYPASS. LIMA TO LAD, SVG TO PD, SVG TO DIAG (N/A) TRICUSPID VALVE REPAIR USING AN 36MM EDWARDS MC3 TRICUSPID ANNULOPLASTY RING (N/A) TRANSESOPHAGEAL ECHOCARDIOGRAM (TEE) (N/A) APPLICATION OF CELL SAVER (N/A) ENDOVEIN HARVEST OF GREATER SAPHENOUS VEIN (Right) REPAIR OF PATENT FORAMEN OVALE (N/A) Subjective: Alert, sensitive to touch  Objective: Vital signs in last 24 hours: Temp:  [98.5 F (36.9 C)-99.9 F (37.7 C)] 99.1 F (37.3 C) (02/21 0400) Pulse Rate:  [112-131] 121 (02/21 0645) Cardiac Rhythm: Sinus tachycardia (02/21 0400) Resp:  [12-30] 16 (02/21 0645) BP: (90-109)/(51-61) 109/61 (02/21 0334) SpO2:  [59 %-100 %] 100 % (02/21 0750) FiO2 (%):  [40 %-50 %] 40 % (02/21 0334) Weight:  [66.9 kg] 66.9 kg (02/21 0403)  Hemodynamic parameters for last 24 hours: CVP:  [13 mmHg-24 mmHg] 17 mmHg  Intake/Output from previous day: 02/20 0701 - 02/21 0700 In: 1766.9 [I.V.:897.5; NG/GT:765; IV Piggyback:104.5] Out: 1772 [Stool:1400] Intake/Output this shift: No intake/output data recorded.  General appearance: alert and distracted Neurologic: no focal deficit Heart: tachy, regular Lungs: clear to auscultation bilaterally Extremities: RUE still edematous Wound: clean and dry  Lab Results: Recent Labs    01/16/22 0340 01/16/22 1159 01/17/22 0348  WBC 21.4*  --  20.4*  HGB 8.4* 9.2* 7.1*  HCT 27.2* 27.0* 24.3*  PLT 154  --  175   BMET:  Recent Labs    01/16/22 1612 01/17/22 0348  NA 137 136  K 4.9 4.9  CL 102 102  CO2 24 25  GLUCOSE 274* 353*  BUN 21 33*  CREATININE 1.42* 2.20*  CALCIUM 8.7* 9.2    PT/INR: No results for input(s): LABPROT, INR in the last 72 hours. ABG    Component Value Date/Time   PHART 7.403 01/16/2022 1159   HCO3 27.0 01/16/2022 1159   TCO2 28 01/16/2022 1159   ACIDBASEDEF 8.0 (H) 01/07/2022 0648   O2SAT 70.6 01/17/2022 0353    CBG (last 3)  Recent Labs    01/16/22 1944 01/17/22 0000 01/17/22 0346  GLUCAP 269* 300* 324*    Assessment/Plan: S/P Procedure(s) (LRB): CORONARY ARTERY BYPASS GRAFTING (CABG) TIMES THREE ON CARDIOPULMONARY BYPASS. LIMA TO LAD, SVG TO PD, SVG TO DIAG (N/A) TRICUSPID VALVE REPAIR USING AN 36MM EDWARDS MC3 TRICUSPID ANNULOPLASTY RING (N/A) TRANSESOPHAGEAL ECHOCARDIOGRAM (TEE) (N/A) APPLICATION OF CELL SAVER (N/A) ENDOVEIN HARVEST OF GREATER SAPHENOUS VEIN (Right) REPAIR OF PATENT FORAMEN OVALE (N/A) - NEURO- intact CV- Sinus tachy  On milrinone 0.25, epi 0.5, norepi 19, vasopressin 0.03  Co-ox 70 this AM  RV failure- Improved hemodynamics with milrinone  CVP 12 RESP- on 5L [place], CXR relatively clear RENAL- off CVVHD this AM  Plan per Nephrology ENDO- CBG markedly elevated- levemir restarted yesterday GI- on tube feedings ID- vanco/ Zosyn Deconditioning- severe, confined to bed with groin lines  PT consulted  LOS: 16 days    [name] 01/17/2022

## 2022-01-17 NOTE — Progress Notes (Signed)
PCCM Progress Note  Notified by RN regarding white yeast like discharge on GU exam.  Plan Ordered fluconazole 150 mg once

## 2022-01-17 NOTE — Progress Notes (Signed)
[place] for bivalirudin Indication: history of pulmonary embolus 2021  Allergies  Allergen Reactions   Minoxidil Other (See Comments)    Pericardial effusion   Lipitor [Atorvastatin] Other (See Comments)    MYALGIAS > "pain in legs" Tolerates rosuvastatin   Morphine And Related Itching   Ace Inhibitors Other (See Comments)    REACTION: "not sure...think it made me drowsy all the time"    Patient Measurements: Height: 4' 11.5" (151.1 cm) Weight: 66.9 kg (147 lb 7.8 oz) IBW/kg (Calculated) : 44.35  Vital Signs: Temp: 98.4 F (36.9 C) (02/21 1201) Temp Source: Oral (02/21 1201) BP: 109/61 (02/21 0334) Pulse Rate: 122 (02/21 1215)  Labs: Recent Labs    01/15/22 0401 01/15/22 0617 01/15/22 1600 01/16/22 0340 01/16/22 1159 01/16/22 1612 01/17/22 0348 01/17/22 0828  HGB 8.5*  --    < > 8.4* 9.2*  --  7.1* 8.5*  HCT 27.4*  --    < > 27.2* 27.0*  --  24.3* 25.0*  PLT 113*  --   --  154  --   --  175  --   APTT  --  60*  --  69*  --   --  60*  --   CREATININE 1.51*  --    < > 1.33*  --  1.42* 2.20*  --    < > = values in this interval not displayed.     Estimated Creatinine Clearance: 21.2 mL/min (A) (by C-G formula based on SCr of 2.2 mg/dL (H)).   Assessment: 66 yo female on chronic apixaban for hx of PE 2021  Apixaban has been held for surgery.  Pharmacy asked to resume anticoagulation 2/15 with bivalirudin.  HIT antibody and SRA both negative.  Platelet count now recovered 50>150   aPTT this morning therapeutic at 60 on bivalirudin 0.0135 mg/kg/hr. Hemoglobin low stable 7-8  No bleeding or complications currently noted.  Goal of Therapy:  aPTT goal ~50-70sec - target lower end given low platelet count/risk of post-operative bleeding Monitor platelets by anticoagulation protocol: Yes   Plan:  -Continue bival at 0.0135 mg/kg/hr -aPTT and CBC daily while on bival -Monitor for s/x of bleeding   Bonnita Nasuti Pharm.D. CPP,  BCPS Clinical Pharmacist [phone] 01/17/2022 1:09 PM     [facility] pharmacy phone numbers are listed on [email or web address]

## 2022-01-18 ENCOUNTER — Inpatient Hospital Stay (HOSPITAL_COMMUNITY): Payer: Medicare Other

## 2022-01-18 DIAGNOSIS — R570 Cardiogenic shock: Secondary | ICD-10-CM | POA: Diagnosis not present

## 2022-01-18 DIAGNOSIS — R6521 Severe sepsis with septic shock: Secondary | ICD-10-CM

## 2022-01-18 DIAGNOSIS — A419 Sepsis, unspecified organism: Secondary | ICD-10-CM | POA: Diagnosis not present

## 2022-01-18 DIAGNOSIS — R072 Precordial pain: Secondary | ICD-10-CM | POA: Diagnosis not present

## 2022-01-18 LAB — CBC WITH DIFFERENTIAL/PLATELET
Abs Immature Granulocytes: 0.3 10*3/uL — ABNORMAL HIGH (ref 0.00–0.07)
Basophils Absolute: 0.1 10*3/uL (ref 0.0–0.1)
Basophils Relative: 0 %
Eosinophils Absolute: 0.4 10*3/uL (ref 0.0–0.5)
Eosinophils Relative: 1 %
HCT: 24.2 % — ABNORMAL LOW (ref 36.0–46.0)
Hemoglobin: 7.3 g/dL — ABNORMAL LOW (ref 12.0–15.0)
Immature Granulocytes: 1 %
Lymphocytes Relative: 3 %
Lymphs Abs: 0.6 10*3/uL — ABNORMAL LOW (ref 0.7–4.0)
MCH: 29.3 pg (ref 26.0–34.0)
MCHC: 30.2 g/dL (ref 30.0–36.0)
MCV: 97.2 fL (ref 80.0–100.0)
Monocytes Absolute: 1.8 10*3/uL — ABNORMAL HIGH (ref 0.1–1.0)
Monocytes Relative: 7 %
Neutro Abs: 21.9 10*3/uL — ABNORMAL HIGH (ref 1.7–7.7)
Neutrophils Relative %: 88 %
Platelets: 194 10*3/uL (ref 150–400)
RBC: 2.49 MIL/uL — ABNORMAL LOW (ref 3.87–5.11)
RDW: 22.3 % — ABNORMAL HIGH (ref 11.5–15.5)
WBC: 25.1 10*3/uL — ABNORMAL HIGH (ref 4.0–10.5)
nRBC: 4.1 % — ABNORMAL HIGH (ref 0.0–0.2)

## 2022-01-18 LAB — COOXEMETRY PANEL
Carboxyhemoglobin: 2.5 % — ABNORMAL HIGH (ref 0.5–1.5)
Methemoglobin: 0.9 % (ref 0.0–1.5)
O2 Saturation: 73.7 %
Total hemoglobin: 7.6 g/dL — ABNORMAL LOW (ref 12.0–16.0)

## 2022-01-18 LAB — BASIC METABOLIC PANEL
Anion gap: 13 (ref 5–15)
BUN: 50 mg/dL — ABNORMAL HIGH (ref 8–23)
CO2: 22 mmol/L (ref 22–32)
Calcium: 9.6 mg/dL (ref 8.9–10.3)
Chloride: 102 mmol/L (ref 98–111)
Creatinine, Ser: 3.58 mg/dL — ABNORMAL HIGH (ref 0.44–1.00)
GFR, Estimated: 13 mL/min — ABNORMAL LOW (ref >60)
Glucose, Bld: 189 mg/dL — ABNORMAL HIGH (ref 70–99)
Potassium: 4.9 mmol/L (ref 3.5–5.1)
Sodium: 137 mmol/L (ref 135–145)

## 2022-01-18 LAB — POCT I-STAT 7, (LYTES, BLD GAS, ICA,H+H)
Acid-base deficit: 1 mmol/L (ref 0.0–2.0)
Acid-base deficit: 2 mmol/L (ref 0.0–2.0)
Bicarbonate: 23.1 mmol/L (ref 20.0–28.0)
Bicarbonate: 23.9 mmol/L (ref 20.0–28.0)
Calcium, Ion: 1.29 mmol/L (ref 1.15–1.40)
Calcium, Ion: 1.35 mmol/L (ref 1.15–1.40)
HCT: 24 % — ABNORMAL LOW (ref 36.0–46.0)
HCT: 25 % — ABNORMAL LOW (ref 36.0–46.0)
Hemoglobin: 8.2 g/dL — ABNORMAL LOW (ref 12.0–15.0)
Hemoglobin: 8.5 g/dL — ABNORMAL LOW (ref 12.0–15.0)
O2 Saturation: 94 %
O2 Saturation: 99 %
Patient temperature: 96.6
Patient temperature: 98.4
Potassium: 4.5 mmol/L (ref 3.5–5.1)
Potassium: 4.6 mmol/L (ref 3.5–5.1)
Sodium: 135 mmol/L (ref 135–145)
Sodium: 137 mmol/L (ref 135–145)
TCO2: 24 mmol/L (ref 22–32)
TCO2: 25 mmol/L (ref 22–32)
pCO2 arterial: 39 mmHg (ref 32–48)
pCO2 arterial: 40 mmHg (ref 32–48)
pH, Arterial: 7.364 (ref 7.35–7.45)
pH, Arterial: 7.394 (ref 7.35–7.45)
pO2, Arterial: 116 mmHg — ABNORMAL HIGH (ref 83–108)
pO2, Arterial: 71 mmHg — ABNORMAL LOW (ref 83–108)

## 2022-01-18 LAB — RENAL FUNCTION PANEL
Albumin: 2.5 g/dL — ABNORMAL LOW (ref 3.5–5.0)
Anion gap: 12 (ref 5–15)
BUN: 43 mg/dL — ABNORMAL HIGH (ref 8–23)
CO2: 22 mmol/L (ref 22–32)
Calcium: 9 mg/dL (ref 8.9–10.3)
Chloride: 101 mmol/L (ref 98–111)
Creatinine, Ser: 2.65 mg/dL — ABNORMAL HIGH (ref 0.44–1.00)
GFR, Estimated: 19 mL/min — ABNORMAL LOW (ref >60)
Glucose, Bld: 154 mg/dL — ABNORMAL HIGH (ref 70–99)
Phosphorus: 3.1 mg/dL (ref 2.5–4.6)
Potassium: 4.6 mmol/L (ref 3.5–5.1)
Sodium: 135 mmol/L (ref 135–145)

## 2022-01-18 LAB — GLUCOSE, CAPILLARY
Glucose-Capillary: 173 mg/dL — ABNORMAL HIGH (ref 70–99)
Glucose-Capillary: 174 mg/dL — ABNORMAL HIGH (ref 70–99)
Glucose-Capillary: 245 mg/dL — ABNORMAL HIGH (ref 70–99)
Glucose-Capillary: 299 mg/dL — ABNORMAL HIGH (ref 70–99)
Glucose-Capillary: 179 mg/dL — ABNORMAL HIGH (ref 70–99)
Glucose-Capillary: 112 mg/dL — ABNORMAL HIGH (ref 70–99)
Glucose-Capillary: 118 mg/dL — ABNORMAL HIGH (ref 70–99)

## 2022-01-18 LAB — MAGNESIUM: Magnesium: 3 mg/dL — ABNORMAL HIGH (ref 1.7–2.4)

## 2022-01-18 LAB — PROCALCITONIN: Procalcitonin: 3.54 ng/mL

## 2022-01-18 LAB — APTT: aPTT: 59 seconds — ABNORMAL HIGH (ref 24–36)

## 2022-01-18 MED ORDER — PIPERACILLIN-TAZOBACTAM 3.375 G IVPB
3.3750 g | Freq: Four times a day (QID) | INTRAVENOUS | Status: DC
  Administered 2022-01-18: 3.375 g via INTRAVENOUS
  Filled 2022-01-18: qty 50

## 2022-01-18 MED ORDER — SODIUM CHLORIDE 0.9 % IV SOLN: INTRAVENOUS | Status: DC | PRN

## 2022-01-18 MED ORDER — PRISMASOL BGK 4/2.5 32-4-2.5 MEQ/L EC SOLN: Status: DC

## 2022-01-18 MED ORDER — AMIODARONE LOAD VIA INFUSION
150.0000 mg | Freq: Once | INTRAVENOUS | Status: AC
  Administered 2022-01-18: 150 mg via INTRAVENOUS
  Filled 2022-01-18: qty 83.34

## 2022-01-18 MED ORDER — PIPERACILLIN-TAZOBACTAM 3.375 G IVPB 30 MIN
3.3750 g | Freq: Four times a day (QID) | INTRAVENOUS | Status: AC
  Administered 2022-01-18 – 2022-01-22 (×17): 3.375 g via INTRAVENOUS
  Filled 2022-01-18 (×32): qty 50

## 2022-01-18 MED ORDER — PRISMASOL BGK 4/2.5 32-4-2.5 MEQ/L REPLACEMENT SOLN: Status: DC

## 2022-01-18 MED ORDER — ALTEPLASE 2 MG IJ SOLR: 2.0000 mg | Freq: Once | INTRAMUSCULAR | Status: DC | PRN

## 2022-01-18 MED ORDER — SODIUM CHLORIDE 0.9 % FOR CRRT: INTRAVENOUS_CENTRAL | Status: DC | PRN

## 2022-01-18 MED ORDER — AMIODARONE HCL IN DEXTROSE 360-4.14 MG/200ML-% IV SOLN
30.0000 mg/h | INTRAVENOUS | Status: DC
  Administered 2022-01-18 – 2022-01-30 (×24): 30 mg/h via INTRAVENOUS
  Filled 2022-01-18 (×25): qty 200

## 2022-01-18 MED ORDER — MIDODRINE HCL 5 MG PO TABS
10.0000 mg | ORAL_TABLET | Freq: Three times a day (TID) | ORAL | Status: DC
  Administered 2022-01-18 – 2022-01-25 (×20): 10 mg via ORAL
  Filled 2022-01-18 (×20): qty 2

## 2022-01-18 MED ORDER — HEPARIN SODIUM (PORCINE) 1000 UNIT/ML DIALYSIS
1000.0000 [IU] | INTRAMUSCULAR | Status: DC | PRN
  Administered 2022-01-23 – 2022-01-26 (×4): 2800 [IU] via INTRAVENOUS_CENTRAL
  Filled 2022-01-18 (×2): qty 6
  Filled 2022-01-18: qty 1
  Filled 2022-01-18 (×4): qty 6

## 2022-01-18 MED ORDER — AMIODARONE HCL IN DEXTROSE 360-4.14 MG/200ML-% IV SOLN
60.0000 mg/h | INTRAVENOUS | Status: AC
  Administered 2022-01-18 (×2): 60 mg/h via INTRAVENOUS
  Filled 2022-01-18: qty 200

## 2022-01-18 NOTE — Progress Notes (Signed)
SLP Cancellation Note  Patient Details Name: Jody Nguyen MRN: [id number] DOB: 1956-06-17   Cancelled treatment:        Attempted to see pt for dysphagia management and possible instrumental evaluation.  Pt requiring BiPAP at this time.  Pt has been on Echo during most of the time this week, but participation has been limited to lethargy. Spoke with RN who says pt has been tolerating ice chips well.  Continue to offer ice chips in isolation, HTL without ice, and bites of puree from floor stock for comfort.  BiPAP typically improves pt's level of alertness per RN; if she is ready for instrumental assessment later today, please call acute rehab office.  SLP will continue to follow for PO readiness.   Celedonio Savage, MA, [place] Office: [phone] 01/18/2022, 9:43 AM

## 2022-01-18 NOTE — Progress Notes (Signed)
OT Cancellation Note  Patient Details Name: Jody Nguyen MRN: [id number] DOB: 15-Jan-1956   Cancelled Treatment:    Reason Eval/Treat Not Completed: Patient at procedure or test/ unavailable (CRRT in room on bipap)  Jeri Modena 01/18/2022, 12:16 PM

## 2022-01-18 NOTE — Progress Notes (Signed)
[facility]  Subjective  - remains critically ill in the ICU.   Objective 114/70 (!) 126 97.8 F (36.6 C) (Oral) 13 100%  Intake/Output Summary (Last 24 hours) at 01/18/2022 0814 Last data filed at 01/18/2022 0706 Gross per 24 hour  Intake 2983.91 ml  Output 330 ml  Net 2653.91 ml    Infiltrated right forearm AV fistula that overall remains soft Right radial triphasic and palmar arch brisk  Laboratory Lab Results: Recent Labs    01/17/22 0348 01/17/22 0828 01/18/22 0053 01/18/22 0401  WBC 20.4*  --   --  25.1*  HGB 7.1*   < > 8.2* 7.3*  HCT 24.3*   < > 24.0* 24.2*  PLT 175  --   --  194   < > = values in this interval not displayed.   BMET Recent Labs    01/17/22 1634 01/18/22 0053 01/18/22 0401  NA 136 137 137  K 4.8 4.5 4.9  CL 102  --  102  CO2 23  --  22  GLUCOSE 182*  --  189*  BUN 43*  --  50*  CREATININE 2.99*  --  3.58*  CALCIUM 9.4  --  9.6    COAG Lab Results  Component Value Date   INR 1.5 (H) 01/11/2022   INR 1.5 (H) 01/10/2022   INR 1.7 (H) 01/09/2022   No results found for: PTT  Assessment/Planning:  66 year old female status post CABG that vascular has been following for infiltrated right forearm AV fistula.  This AVF infiltrated prior to her CABG but at the time was usable in dialysis.  She has brisk radial and palmar arch signal on multiple pressors with a well-perfused hand.  Has limited access options with a right femoral temporary dialysis catheter and a triple-lumen in the left groin.  Concern for sepsis with bacteremia.  Has limited access options and in discussion with Dr. Roxan Hockey left IJ would be the only other option in upper extremity.  I would be very hesitant to take her to the OR at this time given her critical illness on multiple pressors.  I have discussed with critical care potentially placing a left IJ temporary catheter.  Marty Heck 01/18/2022 8:14 AM --

## 2022-01-18 NOTE — Progress Notes (Signed)
[facility] Progress Note  Name: Jody Nguyen MRN: [id number] DOB: 07-01-1956   Subjective: I/O net + 1.5 L. CVP 17- 19. CXR no new changes, no edema. Pt not feeling well. Had to use bipap overnight and today having troubles keeping sats up.    Intake/Output Summary (Last 24 hours) at 01/18/2022 0851 Last data filed at 01/18/2022 0706 Gross per 24 hour  Intake 2983.91 ml  Output 330 ml  Net 2653.91 ml     Vitals:  Vitals:   01/18/22 0600 01/18/22 0630 01/18/22 0645 01/18/22 0700  BP:      Pulse: (!) 128 (!) 134 (!) 129 (!) 126  Resp: 18 16 19 13   Temp:    97.8 F (36.6 C)  TempSrc:    Oral  SpO2: 99% (!) 87% 100% 100%  Weight:      Height:         Physical Exam:  General: tired appearing, NAD Lungs: clear ant/ lat, nasal O2 Heart S1S2  Abdomen soft obese Extremities: mild LE , LUE edema, +2 RUE edema Neuro : awake, tired, responsive Access: no bruit RUE AVF; right fem temp HD line c/d/i  Medications reviewed   Labs:  BMP Latest Ref Rng & Units 01/18/2022 01/18/2022 01/17/2022  Glucose 70 - 99 mg/dL 189(H) - 182(H)  BUN 8 - 23 mg/dL 50(H) - 43(H)  Creatinine 0.44 - 1.00 mg/dL 3.58(H) - 2.99(H)  BUN/Creat Ratio 6 - 22 (calc) - - -  Sodium 135 - 145 mmol/L 137 137 136  Potassium 3.5 - 5.1 mmol/L 4.9 4.5 4.8  Chloride 98 - 111 mmol/L 102 - 102  CO2 22 - 32 mmol/L 22 - 23  Calcium 8.9 - 10.3 mg/dL 9.6 - 9.4   OP HD: [facility]WF [facility] 3:15h   450/A1.5   75.5kg   2K/2Ca bath  P2   AVF  Hep  2500 - Hectoral 52mcg IV q HD - Sensipar 90mg  PO q HD - Mircera 123mcg IV q 2 weeks (ordered, not yet given. Last Mircera 100 on 12/07/21)   Assessment/Plan:   CAD s/p CABG x3, TR sp TVR:  per CT surgery   ESRD:  normally MWF schedule, last [facility] Thursday 2/09. CRRT startedo n 2/11. Trial of holding CRRT not going well w/ hypoxia and ^wob today. CVP up 18. Will resume CRRT. UF 50-75 cc/hr.  Infiltrated AVF - AVF infiltrated on 2/7. Expert cannulator able to stick 2/09 and  completed HD pre CABG. Post-op AVF is nonfunctional, VVS following. Planning for [facility] when more stable.  Hypotension - remain on 3 pressors (epi/ vaso/ levo) and milrinone Leukocytosis-per pmd, on IV zosyn Thrombycytopenia- resolved Volume: hx of HTN, remains in shock here. CVP up and resp status worse, will plan UF 50- 75 cc/hr w/ CRRT.   Anemia CKD : s/p PRBC's , Hb 8.5 transfuse prn, esa ordered to start 5/37  Metabolic bone disease: resume home binders and sensipar when taking PO. Cont IV vdra.   Pulm HTN:  Followed at [facility], on Revatio and opsumit  T2DM-per primary service  Hx of MV replacement - in 2016  Sol Blazing, MD 01/18/2022 8:51 AM

## 2022-01-18 NOTE — Progress Notes (Signed)
EVENING ROUNDS NOTE :     [address]       [place] [address]             [phone]                 12 Days Post-Op Procedure(s) (LRB): CORONARY ARTERY BYPASS GRAFTING (CABG) TIMES THREE ON CARDIOPULMONARY BYPASS. LIMA TO LAD, SVG TO PD, SVG TO DIAG (N/A) TRICUSPID VALVE REPAIR USING AN 36MM EDWARDS MC3 TRICUSPID ANNULOPLASTY RING (N/A) TRANSESOPHAGEAL ECHOCARDIOGRAM (TEE) (N/A) APPLICATION OF CELL SAVER (N/A) ENDOVEIN HARVEST OF GREATER SAPHENOUS VEIN (Right) REPAIR OF PATENT FORAMEN OVALE (N/A)   Total Length of Stay:  LOS: 17 days  Events:   No events On CRRT pulling fluid     BP 114/70    Pulse (!) 102    Temp 97.8 F (36.6 C) (Oral)    Resp 14    Ht 4' 11.5" (1.511 m)    Wt 66.9 kg    SpO2 100%    BMI 29.29 kg/m   CVP:  [12 mmHg-48 mmHg] 13 mmHg  Vent Mode: PCV;BIPAP FiO2 (%):  [40 %-90 %] 90 % Set Rate:  [14 bmp] 14 bmp PEEP:  [5 cmH20] 5 cmH20    prismasol BGK 4/2.5 500 mL/hr at 01/18/22 1122    prismasol BGK 4/2.5 500 mL/hr at 01/18/22 1122   sodium chloride 10 mL/hr at 01/18/22 1553   amiodarone 30 mg/hr (01/18/22 1700)   bivalirudin (ANGIOMAX) infusion 0.5 mg/mL (Non-ACS indications) 0.0135 mg/kg/hr (01/18/22 1700)   epinephrine 3 mcg/min (01/18/22 1700)   feeding supplement (VITAL 1.5 CAL) 1,000 mL (01/17/22 1715)   milrinone 0.25 mcg/kg/min (01/18/22 1700)   norepinephrine (LEVOPHED) Adult infusion 23 mcg/min (01/18/22 1700)   piperacillin-tazobactam     prismasol BGK 4/2.5 1,400 mL/hr at 01/18/22 1122   vasopressin 0.04 Units/min (01/18/22 1700)    I/O last 3 completed shifts: In: 3602.3 [I.V.:1383.4; NG/GT:1942.3; IV Piggyback:276.7] Out: 830 [Stool:830]   CBC Latest Ref Rng & Units 01/18/2022 01/18/2022 01/18/2022  WBC 4.0 - 10.5 K/uL - 25.1(H) -  Hemoglobin 12.0 - 15.0 g/dL 8.5(L) 7.3(L) 8.2(L)  Hematocrit 36.0 - 46.0 % 25.0(L) 24.2(L) 24.0(L)  Platelets 150 - 400 K/uL - 194 -    BMP Latest Ref Rng & Units 01/18/2022  01/18/2022 01/18/2022  Glucose 70 - 99 mg/dL - 189(H) -  BUN 8 - 23 mg/dL - 50(H) -  Creatinine 0.44 - 1.00 mg/dL - 3.58(H) -  BUN/Creat Ratio 6 - 22 (calc) - - -  Sodium 135 - 145 mmol/L 135 137 137  Potassium 3.5 - 5.1 mmol/L 4.6 4.9 4.5  Chloride 98 - 111 mmol/L - 102 -  CO2 22 - 32 mmol/L - 22 -  Calcium 8.9 - 10.3 mg/dL - 9.6 -    ABG    Component Value Date/Time   PHART 7.364 01/18/2022 1451   PCO2ART 40.0 01/18/2022 1451   PO2ART 71 (L) 01/18/2022 1451   HCO3 23.1 01/18/2022 1451   TCO2 24 01/18/2022 1451   ACIDBASEDEF 2.0 01/18/2022 1451   O2SAT 94 01/18/2022 1451       Melodie Bouillon, MD 01/18/2022 5:14 PM

## 2022-01-18 NOTE — Progress Notes (Signed)
12 Days Post-Op Procedure(s) (LRB): CORONARY ARTERY BYPASS GRAFTING (CABG) TIMES THREE ON CARDIOPULMONARY BYPASS. LIMA TO LAD, SVG TO PD, SVG TO DIAG (N/A) TRICUSPID VALVE REPAIR USING AN 36MM EDWARDS MC3 TRICUSPID ANNULOPLASTY RING (N/A) TRANSESOPHAGEAL ECHOCARDIOGRAM (TEE) (N/A) APPLICATION OF CELL SAVER (N/A) ENDOVEIN HARVEST OF GREATER SAPHENOUS VEIN (Right) REPAIR OF PATENT FORAMEN OVALE (N/A) Subjective: Doesn't feel well this AM  Objective: Vital signs in last 24 hours: Temp:  [97.3 F (36.3 C)-98.5 F (36.9 C)] 98.4 F (36.9 C) (02/22 0300) Pulse Rate:  [102-142] 126 (02/22 0700) Cardiac Rhythm: Atrial fibrillation (02/21 1941) Resp:  [6-30] 13 (02/22 0700) BP: (102-114)/(57-70) 114/70 (02/22 0349) SpO2:  [87 %-100 %] 100 % (02/22 0700) FiO2 (%):  [40 %] 40 % (02/22 0400)  Hemodynamic parameters for last 24 hours: CVP:  [5 mmHg-36 mmHg] 16 mmHg  Intake/Output from previous day: 02/21 0701 - 02/22 0700 In: 2879.4 [I.V.:955.5; BL/TJ:0300; IV Piggyback:226.9] Out: 330 [Stool:330] Intake/Output this shift: Total I/O In: 104.5 [I.V.:104.5] Out: -   General appearance: cooperative and mild distress Neurologic: intact Heart: irregularly irregular rhythm Lungs: diminished breath sounds bibasilar Abdomen: mildly tender, + BS, no rebound  Lab Results: Recent Labs    01/17/22 0348 01/17/22 0828 01/18/22 0053 01/18/22 0401  WBC 20.4*  --   --  25.1*  HGB 7.1*   < > 8.2* 7.3*  HCT 24.3*   < > 24.0* 24.2*  PLT 175  --   --  194   < > = values in this interval not displayed.   BMET:  Recent Labs    01/17/22 1634 01/18/22 0053 01/18/22 0401  NA 136 137 137  K 4.8 4.5 4.9  CL 102  --  102  CO2 23  --  22  GLUCOSE 182*  --  189*  BUN 43*  --  50*  CREATININE 2.99*  --  3.58*  CALCIUM 9.4  --  9.6    PT/INR: No results for input(s): LABPROT, INR in the last 72 hours. ABG    Component Value Date/Time   PHART 7.394 01/18/2022 0053   HCO3 23.9 01/18/2022  0053   TCO2 25 01/18/2022 0053   ACIDBASEDEF 1.0 01/18/2022 0053   O2SAT 73.7 01/18/2022 0401   CBG (last 3)  Recent Labs    01/17/22 2330 01/18/22 0359 01/18/22 0400  GLUCAP 111* 174* 173*    Assessment/Plan: S/P Procedure(s) (LRB): CORONARY ARTERY BYPASS GRAFTING (CABG) TIMES THREE ON CARDIOPULMONARY BYPASS. LIMA TO LAD, SVG TO PD, SVG TO DIAG (N/A) TRICUSPID VALVE REPAIR USING AN 36MM EDWARDS MC3 TRICUSPID ANNULOPLASTY RING (N/A) TRANSESOPHAGEAL ECHOCARDIOGRAM (TEE) (N/A) APPLICATION OF CELL SAVER (N/A) ENDOVEIN HARVEST OF GREATER SAPHENOUS VEIN (Right) REPAIR OF PATENT FORAMEN OVALE (N/A) Remains critically ill NEURO- no focal deficit  CV- went into A fib with RVR, QTc yesterday was 467- not a contraindication to amiodarone and she is not tolerating RVR- will start IV amiodarone. Monitor QT Co-ox 73 but requiring increased doses of norepi  Increase epi to 3, vasopressin increased to 0.04, titrate norepi  RESP- BIPAP overnight  RENAL- on CVVHD holiday. Very limited options for access.  ENDO- CBG mildly elevated  ID- on Zosyn for Morganella, received vanco on 2/17  Diflucan for yeast  GI / nutrition- on tube feeds  WBC back up this AM, PCT 3.54   LOS: 17 days    Jody Nguyen 01/18/2022

## 2022-01-18 NOTE — Progress Notes (Signed)
ABG ordered at this time d/t pt becoming SOB after becoming uncomfortable on bipap and asking for a break.  Pt lasted about 5 mins before needing to be placed back on.  Pt was placed back on bipap at previous settings and ABG obtained.  Rt will cont to monitor.

## 2022-01-18 NOTE — Progress Notes (Signed)
[place] for bivalirudin Indication: history of pulmonary embolus 2021  Allergies  Allergen Reactions   Minoxidil Other (See Comments)    Pericardial effusion   Lipitor [Atorvastatin] Other (See Comments)    MYALGIAS > "pain in legs" Tolerates rosuvastatin   Morphine And Related Itching   Ace Inhibitors Other (See Comments)    REACTION: "not sure...think it made me drowsy all the time"    Patient Measurements: Height: 4' 11.5" (151.1 cm) Weight: 66.9 kg (147 lb 7.8 oz) IBW/kg (Calculated) : 44.35  Vital Signs: Temp: 97.8 F (36.6 C) (02/22 0700) Temp Source: Oral (02/22 0800) BP: 114/70 (02/22 0349) Pulse Rate: 96 (02/22 1330)  Labs: Recent Labs    01/16/22 0340 01/16/22 1159 01/17/22 0348 01/17/22 0828 01/17/22 1634 01/18/22 0053 01/18/22 0401  HGB 8.4*   < > 7.1* 8.5*  --  8.2* 7.3*  HCT 27.2*   < > 24.3* 25.0*  --  24.0* 24.2*  PLT 154  --  175  --   --   --  194  APTT 69*  --  60*  --   --   --  59*  CREATININE 1.33*   < > 2.20*  --  2.99*  --  3.58*   < > = values in this interval not displayed.     Estimated Creatinine Clearance: 13 mL/min (A) (by C-G formula based on SCr of 3.58 mg/dL (H)).   Assessment: 66 yo female on chronic apixaban for hx of PE 2021  Apixaban has been held for surgery.  Pharmacy asked to resume anticoagulation 2/15 with bivalirudin.  HIT antibody and SRA both negative.  Platelet count now recovered 50>150   aPTT this morning therapeutic at 59sec on bivalirudin 0.0135 mg/kg/hr. Hemoglobin low stable 7-8  No bleeding or complications currently noted.  Goal of Therapy:  aPTT goal ~50-70sec - target lower end given low platelet count/risk of post-operative bleeding Monitor platelets by anticoagulation protocol: Yes   Plan:  -Continue bival at 0.0135 mg/kg/hr -aPTT and CBC daily while on bival -Monitor for s/x of bleeding   Bonnita Nasuti Pharm.D. CPP, BCPS Clinical  Pharmacist [phone] 01/18/2022 2:55 PM     [facility] pharmacy phone numbers are listed on [email or web address]

## 2022-01-18 NOTE — Progress Notes (Addendum)
NAME:  JAWANA REAGOR, MRN:  [id number], DOB:  [birth year], LOS: 77 ADMISSION DATE:  01/04/2022, CONSULTATION DATE:  01/08/2022 REFERRING MD:  Marlowe Sax - TRH, CHIEF COMPLAINT:  SOB   History of Present Illness:  66 year old woman who presented to [facility] ED 2/4 with CP radiating to her arm. PMHx significant for  ESRD (on HD), non-obstructive CAD, suspected pulmonary hypertension, PE (on Eliquis). Recent ED presentation 2/1 for similar symptoms (troponin 100s). F/u with Cardiology as an outpatient 2/2 and CP was felt to be related to volume shift (occurring during dialysis).   On ED presentation 2/4, patient reported episodic chest pain, non-exertional and intermittent x 1 week with associated nausea and SOB. Non-specific EKG changes. Cardiology consulted, STEMI ruled out. Troponin elevated at 200. Placed on [place] for SpO2 80s. Admitted to [facility] for observation with concern for PE and started on heparin gtt. VQ with low probability for PE. Echo 2/19 demonstrated severe RV/RA enlargement, severely reduced RV function and septal bowing; felt to be consistent with chronic process such as severe pulmonary HTN.    PCCM has been consulted in this setting.  Pertinent Medical History:  ESRD PE Pulmonary HTN  CAD CVA HLD HTN S/p MV repair  Pericardial effusion r/t minoxidil  Diastolic HF DM2  Significant Hospital Events: Including procedures, antibiotic start and stop dates in addition to other pertinent events   2/4 ED presentation with recurring chest pain -- STEMI ruled out. Stable, mildly hypoxic and placed on [place] 2/5 admit to [facility] for obvs. Concern for PE, ECHO performed by cardiology reportedly shows RV failure, but felt more consistent with chronic process like severe pulmHTN. Awaiting VQ scan. Pulm consulted  2/7 cardiac cath. 3 vessel dz. CABG rec 2/10 CABG, TVR 2/11 continued vasoplegic shock with very high vasopressor requirements  2/12 Given methylene blue x 2 2/13 chest tube removed 2/14 JP  removed 2/15 pacing wires removed 2/16 milrinone stopped, extubated 2/17 zosyn for GNR bacteremia-> Morganella morganii 2/18 arterial line, central line, vascath all removed and replaced. 2/19 milrinone back on. Co-ox improved. ECHO Left ventricular ejection fraction, by estimation, is 50 to 55%. The  left ventricle has low normal function. No regional WM abnormality. Right Ventricle: Ventricular septum is flattened in systole and diastole  suggesting RV pressure and volume overload. The right ventricular size is  severely enlarged. Right vetricular wall thickness was not well  visualized. Right ventricular systolic  function is severely reduced. There is moderately elevated pulmonary  artery systolic pressure 7/12 still high pressor needs. CRRT stopped. Having intermittent bouts of tachycardia 2/21 Hemodynamics better. Weaning Epi. Still on Norepi and inotropic support. Supported on BIPAP over night and tolerated well. On am rounds more lethargic w/ generalized pain everywhere. Co-ox > 70. Glucose poorly controlled. 2/22 Escalating pressor requirements (NE, Epi, Vaso). Continues on milrinone. Afib with RVR overnight, now with persistent AF (on amio, bival). Co-ox 73%. Hgb 7.3. WBC uptrending (25 from 20), Cr uptrending, CRRT restarted. Tolerating BiPAP.  Interim History / Subjective:  AF with RVR 2/21PM Uptrending pressor requirements, intermittently drops Tolerated BiPAP overnight Ongoing RUE pain at fistula site WBC uptrending, H&H drifting CRRT resumed  Objective:  Blood pressure 114/70, pulse (!) 126, temperature 98.4 F (36.9 C), temperature source Axillary, resp. rate 13, height 4' 11.5" (1.511 m), weight 66.9 kg, SpO2 100 %. CVP:  [5 mmHg-36 mmHg] 16 mmHg  Vent Mode: BIPAP;PCV FiO2 (%):  [40 %] 40 % Set Rate:  [14 bmp] 14 bmp PEEP:  [5 cmH20]  5 cmH20   Intake/Output Summary (Last 24 hours) at 01/18/2022 0730 Last data filed at 01/18/2022 1660 Gross per 24 hour  Intake 2983.91 ml   Output 330 ml  Net 2653.91 ml    Filed Weights   01/15/22 0615 01/16/22 0630 01/17/22 0403  Weight: 69.5 kg 66.9 kg 66.9 kg   Physical Examination: General: Acute-on-chronically ill-appearing middle-aged woman in NAD. Appears uncomfortable. HEENT: /AT, anicteric sclera, PERRL, moist mucous membranes. Neuro: Awake, oriented x 4. Responds to verbal stimuli. Following commands consistently. Moves all 4 extremities spontaneously. Strength 3/5 in all 4 extremities. CV: Irregularly irregular rhythm, rate 120s, no m/g/r. PULM: Breathing even and unlabored on BiPAP. Lung fields with diminished air movement bilaterally, otherwise clear. GI: Soft, diffuse mild tenderness (nonfocal), nondistended. Normoactive bowel sounds. Extremities: Bilateral symmetric 1+ LE edema noted; bilateral UE edema, nonpitting except near fistula site, 2+. Skin: Warm/dry, no rashes. Incision site c/d/i without erythema/drainage.  Assessment & Plan:  Principal Problem:   Chest pain, Multivessel CAD Active Problems:   Hyperlipidemia associated with type 2 diabetes mellitus (HCC)   Anemia of chronic renal failure   Type 2 diabetes mellitus (HCC)   Obesity (BMI 30-39.9)   Pulmonary hypertension (HCC)   Chronic combined systolic and diastolic heart failure (HCC)   End stage renal disease on dialysis (HCC)   History of stroke   QT prolongation   SVT (supraventricular tachycardia) (HCC)   Coronary artery disease involving native coronary artery of native heart with unstable angina pectoris (HCC)   Hyponatremia   GERD (gastroesophageal reflux disease)   Septic shock (HCC)   Gram-negative bacteremia   Hx of CABG   S/P TVR (tricuspid valve repair)   Cardiogenic shock (HCC)   Tachycardia   S/P CABG (coronary artery bypass graft)  CAD with TR, s/p CABG x 3 and TVR 2/10 Acute-on-chronic systolic/diastolic CHF - TCTS following, appreciate assistance with management - Trend Co-ox, currently stable (73% 2/22) -  Continue milrinone - Goal euvolemia  Mixed cardiogenic and septic shock secondary to morganella morganii bacteremia - Goal MAP > 65 - Increasing pressor requirement; Levophed, Epi, Vaso - Milrinone as above - Zosyn x 10 day course (end 2/26) - Trend WBC, PCT, LA - F/u finalized Bcx  Afib with RVR Afib with RVR noted 2/21PM with rates into 150s. - Continue amiodarone (load + infusion) - Cardiac monitoring - Monitor HR on Epi - Continue bivalrudin  Acute respiratory failure with hypoxia  Combined pre and post capillary PH  Was on Revatio and Opsumit.  - Continue supplemental O2 support - BiPAP QHS and PRN - Wean O2 for sat > 90% - Pulmonary hygiene; IS/flutter as able, encourage mobility when able - Intermittent CXR - Holding Opsumit at present  ESRD on HD Thrombosed AVF - Nephrology following, appreciate assistance - CRRT resumed today, 2/22 - Likely unable to tolerate iHD in the setting of labile BP/hypotension - Trend BMP - Replete electrolytes as indicated - Monitor I&Os - Avoid nephrotoxic agents as able - Ensure adequate renal perfusion - Concern re: long-term HD access; prior thrombosed AVF site likely not amenable to intervention. Will need tunneled Permcath for long-term.  Acute metabolic encephalopathy ? ICU delirium, ? Sepsis. Improved 2/22AM. - Continue supportive care - Delirium precautions  HLD DM2 w/ hyperglycemia  - Levemir 20U BID - TF coverage - SSI (resistant)  Acute blood loss anemia Suspect secondary to surgery, frequent CRRT circuit changes, and chronic kidney disease. CBC stable  - Trend CBC - Transfuse for  Hgb < 7.0 or hemodynamically significant bleeding  Hx PE - ?CTEPH Thrombocytopenia - suspect CRRT related HITT panel 0.075. SRA negative.  - Continue bivalrudin (ease of keeping therapeutic) - Transition back to PO agent eventually once more clinically stable  Inadequate oral intake related to inability to eat At risk for  malnutrition - Nutrition/SLP following - Cortrak in place - Continue TFs - Plan for FEES/instrumental assessment with SLP once able (possibly 2/22 vs. 2/23)  Best Practice: (right click and "Reselect all SmartList Selections" daily)   Diet/type: tubefeeds   DVT prophylaxis: other- on bivalirudin GI prophylaxis: PPI Lines: Central line, Dialysis Catheter, Arterial Line, and yes and it is still needed  Foley:  N/A Code Status:  full code Last date of multidisciplinary goals of care discussion [Family updated 2/18]   Critical care time: 40 minutes   Lestine Mount, PA-C  Pulmonary & Critical Care 01/18/22 8:07 AM  Please see [email or web address] for pager details.  From 7A-7P if no response, please call [phone] After hours, please call ELink [phone]

## 2022-01-19 DIAGNOSIS — R072 Precordial pain: Secondary | ICD-10-CM | POA: Diagnosis not present

## 2022-01-19 DIAGNOSIS — R570 Cardiogenic shock: Secondary | ICD-10-CM | POA: Diagnosis not present

## 2022-01-19 LAB — CBC WITH DIFFERENTIAL/PLATELET
Abs Immature Granulocytes: 0 10*3/uL (ref 0.00–0.07)
Basophils Absolute: 0 10*3/uL (ref 0.0–0.1)
Basophils Relative: 0 %
Eosinophils Absolute: 0 10*3/uL (ref 0.0–0.5)
Eosinophils Relative: 0 %
HCT: 23.9 % — ABNORMAL LOW (ref 36.0–46.0)
Hemoglobin: 7.1 g/dL — ABNORMAL LOW (ref 12.0–15.0)
Lymphocytes Relative: 6 %
Lymphs Abs: 1 10*3/uL (ref 0.7–4.0)
MCH: 28.9 pg (ref 26.0–34.0)
MCHC: 29.7 g/dL — ABNORMAL LOW (ref 30.0–36.0)
MCV: 97.2 fL (ref 80.0–100.0)
Monocytes Absolute: 1.2 10*3/uL — ABNORMAL HIGH (ref 0.1–1.0)
Monocytes Relative: 7 %
Neutro Abs: 14.9 10*3/uL — ABNORMAL HIGH (ref 1.7–7.7)
Neutrophils Relative %: 87 %
Platelets: 188 10*3/uL (ref 150–400)
RBC: 2.46 MIL/uL — ABNORMAL LOW (ref 3.87–5.11)
RDW: 22.8 % — ABNORMAL HIGH (ref 11.5–15.5)
WBC: 17.1 10*3/uL — ABNORMAL HIGH (ref 4.0–10.5)
nRBC: 14 /100 WBC — ABNORMAL HIGH
nRBC: 6.7 % — ABNORMAL HIGH (ref 0.0–0.2)

## 2022-01-19 LAB — GLUCOSE, CAPILLARY
Glucose-Capillary: 107 mg/dL — ABNORMAL HIGH (ref 70–99)
Glucose-Capillary: 122 mg/dL — ABNORMAL HIGH (ref 70–99)
Glucose-Capillary: 122 mg/dL — ABNORMAL HIGH (ref 70–99)
Glucose-Capillary: 181 mg/dL — ABNORMAL HIGH (ref 70–99)
Glucose-Capillary: 196 mg/dL — ABNORMAL HIGH (ref 70–99)
Glucose-Capillary: 212 mg/dL — ABNORMAL HIGH (ref 70–99)
Glucose-Capillary: 55 mg/dL — ABNORMAL LOW (ref 70–99)

## 2022-01-19 LAB — RENAL FUNCTION PANEL
Albumin: 2.5 g/dL — ABNORMAL LOW (ref 3.5–5.0)
Albumin: 2.5 g/dL — ABNORMAL LOW (ref 3.5–5.0)
Anion gap: 11 (ref 5–15)
Anion gap: 9 (ref 5–15)
BUN: 32 mg/dL — ABNORMAL HIGH (ref 8–23)
BUN: 22 mg/dL (ref 8–23)
CO2: 23 mmol/L (ref 22–32)
CO2: 26 mmol/L (ref 22–32)
Calcium: 9.2 mg/dL (ref 8.9–10.3)
Calcium: 9.4 mg/dL (ref 8.9–10.3)
Chloride: 100 mmol/L (ref 98–111)
Chloride: 101 mmol/L (ref 98–111)
Creatinine, Ser: 1.88 mg/dL — ABNORMAL HIGH (ref 0.44–1.00)
Creatinine, Ser: 1.4 mg/dL — ABNORMAL HIGH (ref 0.44–1.00)
GFR, Estimated: 29 mL/min — ABNORMAL LOW (ref >60)
GFR, Estimated: 41 mL/min — ABNORMAL LOW (ref >60)
Glucose, Bld: 207 mg/dL — ABNORMAL HIGH (ref 70–99)
Glucose, Bld: 111 mg/dL — ABNORMAL HIGH (ref 70–99)
Phosphorus: 2.8 mg/dL (ref 2.5–4.6)
Phosphorus: 2.5 mg/dL (ref 2.5–4.6)
Potassium: 4.8 mmol/L (ref 3.5–5.1)
Potassium: 5.3 mmol/L — ABNORMAL HIGH (ref 3.5–5.1)
Sodium: 134 mmol/L — ABNORMAL LOW (ref 135–145)
Sodium: 136 mmol/L (ref 135–145)

## 2022-01-19 LAB — CULTURE, BLOOD (ROUTINE X 2)
Culture: NO GROWTH
Culture: NO GROWTH
Special Requests: ADEQUATE
Special Requests: ADEQUATE

## 2022-01-19 LAB — MAGNESIUM: Magnesium: 2.9 mg/dL — ABNORMAL HIGH (ref 1.7–2.4)

## 2022-01-19 LAB — POCT I-STAT 7, (LYTES, BLD GAS, ICA,H+H)
Acid-Base Excess: 0 mmol/L (ref 0.0–2.0)
Bicarbonate: 23.7 mmol/L (ref 20.0–28.0)
Calcium, Ion: 1.33 mmol/L (ref 1.15–1.40)
HCT: 25 % — ABNORMAL LOW (ref 36.0–46.0)
Hemoglobin: 8.5 g/dL — ABNORMAL LOW (ref 12.0–15.0)
O2 Saturation: 88 %
Patient temperature: 97.4
Potassium: 4.5 mmol/L (ref 3.5–5.1)
Sodium: 135 mmol/L (ref 135–145)
TCO2: 25 mmol/L (ref 22–32)
pCO2 arterial: 35 mmHg (ref 32–48)
pH, Arterial: 7.437 (ref 7.35–7.45)
pO2, Arterial: 51 mmHg — ABNORMAL LOW (ref 83–108)

## 2022-01-19 LAB — HEMOGLOBIN AND HEMATOCRIT, BLOOD
HCT: 24.7 % — ABNORMAL LOW (ref 36.0–46.0)
Hemoglobin: 7.6 g/dL — ABNORMAL LOW (ref 12.0–15.0)

## 2022-01-19 LAB — PROCALCITONIN: Procalcitonin: 1.64 ng/mL

## 2022-01-19 LAB — COOXEMETRY PANEL
Carboxyhemoglobin: 0.6 % (ref 0.5–1.5)
Methemoglobin: 1.6 % — ABNORMAL HIGH (ref 0.0–1.5)
O2 Saturation: 65.7 %
Total hemoglobin: 7.7 g/dL — ABNORMAL LOW (ref 12.0–16.0)

## 2022-01-19 LAB — APTT: aPTT: 56 seconds — ABNORMAL HIGH (ref 24–36)

## 2022-01-19 MED ORDER — VITAL 1.5 CAL PO LIQD
1000.0000 mL | ORAL | Status: AC
  Administered 2022-01-19 – 2022-01-24 (×5): 1000 mL

## 2022-01-19 NOTE — Progress Notes (Signed)
RN called. Pt removed self from bipap. Back on [place].

## 2022-01-19 NOTE — Progress Notes (Signed)
[place] for bivalirudin Indication: history of pulmonary embolus 2021  Allergies  Allergen Reactions   Minoxidil Other (See Comments)    Pericardial effusion   Lipitor [Atorvastatin] Other (See Comments)    MYALGIAS > "pain in legs" Tolerates rosuvastatin   Morphine And Related Itching   Ace Inhibitors Other (See Comments)    REACTION: "not sure...think it made me drowsy all the time"    Patient Measurements: Height: 4' 11.5" (151.1 cm) Weight:  (unable to obtain accurate weight d/t bed not being properly zeroed) IBW/kg (Calculated) : 44.35  Vital Signs: Temp: 97.5 F (36.4 C) (02/23 0820) Temp Source: Axillary (02/23 0820) BP: 88/57 (02/23 0820) Pulse Rate: 112 (02/23 0820)  Labs: Recent Labs    01/17/22 0348 01/17/22 0828 01/18/22 0401 01/18/22 1451 01/18/22 1751 01/19/22 0357  HGB 7.1*   < > 7.3* 8.5*  --  7.1*  HCT 24.3*   < > 24.2* 25.0*  --  23.9*  PLT 175  --  194  --   --  188  APTT 60*  --  59*  --   --  56*  CREATININE 2.20*   < > 3.58*  --  2.65* 1.88*   < > = values in this interval not displayed.     Estimated Creatinine Clearance: 24.8 mL/min (A) (by C-G formula based on SCr of 1.88 mg/dL (H)).   Assessment: 66 yo female on chronic apixaban for hx of PE 2021  Apixaban has been held for surgery.  Pharmacy asked to resume anticoagulation 2/15 with bivalirudin.  HIT antibody and SRA both negative.  Platelet count now recovered 50>188.  aPTT this morning therapeutic at 56 sec on bivalirudin 0.0135 mg/kg/hr. Hemoglobin low stable 7-8  No bleeding or complications currently noted.  Goal of Therapy:  aPTT goal ~50-70sec - target lower end given low platelet count/risk of post-operative bleeding Monitor platelets by anticoagulation protocol: Yes   Plan:  -Continue bival at 0.0135 mg/kg/hr -aPTT and CBC daily while on bival -Monitor for s/x of bleeding  Marguerite Olea, [facility] Clinical  Pharmacist  01/19/2022 9:23 AM   [facility] pharmacy phone numbers are listed on [email or web address]

## 2022-01-19 NOTE — Progress Notes (Signed)
Patient ID: Jody Nguyen, female   DOB: [birth year], 66 y.o.   MRN: [id number]  TCTS Evening Rounds:  Hemodynamically stable today  Remains on milrinone 0.25, epi 3, NE 12, vaso 0.04  CRRT removing 50/hr today.

## 2022-01-19 NOTE — Progress Notes (Signed)
NAME:  Jody Nguyen, MRN:  [id number], DOB:  07/10/1956, LOS: 76 ADMISSION DATE:  12/30/2021, CONSULTATION DATE:  12/30/2021 REFERRING MD:  Marlowe Sax - TRH, CHIEF COMPLAINT:  SOB   History of Present Illness:  66 year old woman who presented to [facility] ED 2/4 with CP radiating to her arm. PMHx significant for  ESRD (on HD), non-obstructive CAD, suspected pulmonary hypertension, PE (on Eliquis). Recent ED presentation 2/1 for similar symptoms (troponin 100s). F/u with Cardiology as an outpatient 2/2 and CP was felt to be related to volume shift (occurring during dialysis).   On ED presentation 2/4, patient reported episodic chest pain, non-exertional and intermittent x 1 week with associated nausea and SOB. Non-specific EKG changes. Cardiology consulted, STEMI ruled out. Troponin elevated at 200. Placed on [place] for SpO2 80s. Admitted to [facility] for observation with concern for PE and started on heparin gtt. VQ with low probability for PE. Echo 2/19 demonstrated severe RV/RA enlargement, severely reduced RV function and septal bowing; felt to be consistent with chronic process such as severe pulmonary HTN.    PCCM has been consulted in this setting.  Pertinent Medical History:  ESRD PE Pulmonary HTN  CAD CVA HLD HTN S/p MV repair  Pericardial effusion r/t minoxidil  Diastolic HF DM2  Significant Hospital Events: Including procedures, antibiotic start and stop dates in addition to other pertinent events   2/4 ED presentation with recurring chest pain -- STEMI ruled out. Stable, mildly hypoxic and placed on [place] 2/5 Admit to [facility] for obvs. Concern for PE, ECHO performed by cardiology reportedly shows RV failure, but felt more consistent with chronic process like severe pulmHTN. Awaiting VQ scan. Pulm consulted  2/7 Cardiac cath. 3 vessel dz. CABG rec 2/10 CABG, TVR 2/11 Continued vasoplegic shock with very high vasopressor requirements  2/12 Given methylene blue x 2 2/13 Chest tube removed 2/14 JP  removed 2/15 pacing wires removed 2/16 milrinone stopped, extubated 2/17 Zosyn for GNR bacteremia-> Morganella morganii 2/18 Arterial line, central line, vascath all removed and replaced. 2/19 Milrinone back on. Co-ox improved. ECHO Left ventricular ejection fraction, by estimation, is 50 to 55%. The left ventricle has low normal function. No regional WM abnormality. Right Ventricle: Ventricular septum is flattened in systole and diastole  suggesting RV pressure and volume overload. The right ventricular size is  severely enlarged. Right vetricular wall thickness was not well  visualized. Right ventricular systolic  function is severely reduced. There is moderately elevated pulmonary  artery systolic pressure 6/96 still high pressor needs. CRRT stopped. Having intermittent bouts of tachycardia 2/21 Hemodynamics better. Weaning Epi. Still on Norepi and inotropic support. Supported on BIPAP over night and tolerated well. On am rounds more lethargic w/ generalized pain everywhere. Co-ox > 70. Glucose poorly controlled. 2/22 Escalating pressor requirements (NE, Epi, Vaso). Continues on milrinone. Afib with RVR overnight, now with persistent AF (on amio, bival). Co-ox 73%. Hgb 7.3. WBC uptrending (25 from 20), Cr uptrending, CRRT restarted. Tolerating BiPAP. 2/23 Improving pressor needs. Stable Epi/Vaso, downtitrating NE. Milrinone 0.25. Persistent AF, rate controlled. Co-ox 65.7%. Ongoing CRRT. BiPAP QHS.  Interim History / Subjective:  Feeling subjectively a little better today Ongoing weakness, less pain Tolerated BiPAP most of the night, took off mask/placed on [place] Pressor needs improving, down on NE Co-ox 66% Continued CRRT H&H, WBC downtrending - monitor  Objective:  Blood pressure (!) 110/59, pulse (!) 102, temperature (!) 96 F (35.6 C), temperature source Axillary, resp. rate 16, height 4' 11.5" (1.511 m), weight 66.9  kg, SpO2 100 %. CVP:  [12 mmHg-48 mmHg] 21 mmHg  Vent Mode:  PCV;BIPAP FiO2 (%):  [40 %-90 %] 40 % Set Rate:  [14 bmp] 14 bmp PEEP:  [5 cmH20] 5 cmH20   Intake/Output Summary (Last 24 hours) at 01/19/2022 0733 Last data filed at 01/19/2022 0700 Gross per 24 hour  Intake 3291.54 ml  Output 4433 ml  Net -1141.46 ml    Filed Weights   01/17/22 0403 01/18/22 0500 01/19/22 0414  Weight: 66.9 kg 66.9 kg 66.9 kg   Physical Examination: General: Acute-on-chronically ill-appearing middle aged woman in NAD. More comfortable-appearing today. HEENT: [place]/AT, anicteric sclera, PERRL, moist mucous membranes. Neuro: Awake, oriented x 4. Responds to verbal stimuli. Following commands consistently. Moves all 4 extremities spontaneously. Strength 3/5 in all 4 extremities. CV: Irregularly irregular rhythm, rate 100s, no m/g/r. PULM: Breathing even and unlabored on 3LNC. Lung fields CTAB in upper fields, diminished at bilateral bases. GI: Soft, diffuse mild tenderness, mildly distended. Normoactive bowel sounds. Extremities: Bilateral symmetric nonpittng 1+ LE edema, bilateral UE edema 2+; nonpitting except for adjacent to fistula site. Skin: Warm/dry, no rashes. Incision sites c/d/i; no erythema/drainage  Assessment & Plan:  Principal Problem:   Chest pain, Multivessel CAD Active Problems:   Hyperlipidemia associated with type 2 diabetes mellitus (HCC)   Anemia of chronic renal failure   Type 2 diabetes mellitus (HCC)   Obesity (BMI 30-39.9)   Pulmonary hypertension (HCC)   Chronic combined systolic and diastolic heart failure (HCC)   End stage renal disease on dialysis ([place])   History of stroke   QT prolongation   SVT (supraventricular tachycardia) (HCC)   Coronary artery disease involving native coronary artery of native heart with unstable angina pectoris (HCC)   Hyponatremia   GERD (gastroesophageal reflux disease)   Septic shock (HCC)   Gram-negative bacteremia   Hx of CABG   S/P TVR (tricuspid valve repair)   Cardiogenic shock (HCC)    Tachycardia   S/P CABG (coronary artery bypass graft)  CAD with TR, s/p CABG x 3 and TVR 2/10 Acute-on-chronic systolic/diastolic CHF - TCTS following, appreciate assistance with management - Trend co-ox (65.7% 2/23, keep in mind taken from femoral CVC) - Continue milrinone - Goal euvolemia  Mixed cardiogenic and septic shock secondary to morganella morganii bacteremia Feel this is more a cardiogenic component at present, given afebrile status, improvement in WBC and ongoing antibiotic coverage. - Goal MAP > 65 - Decreasing pressor requirements (Epi/vaso stable, NE downtitrating) - Milrinone as above - Zosyn x 10-day course (end 2/26) - Trend WBC, PCT, LA - F/u finalized BCx  Afib, persistent; RVR resolved Afib with RVR noted 2/21PM with rates into 150s. - Continue amiodarone gtt - Cardiac monitoring - Monitor HR on Epi, currently rate-controlled - Continue bivalrudin  Acute respiratory failure with hypoxia  Combined pre and post capillary PH  Was on Revatio and Opsumit.  - Continue supplemental O2 supprot - Wean O2 for sat > 90% - Pulmonary hygiene; ID/flutter as able, encourage mobility as able - Intermittent CXR - Hold Opsumit  ESRD on HD Thrombosed AVF - Nephrology following, appreciate assistance - CRRT resumed 2/22; likely unable to tolerate iHD in the setting of labile BP - Trend BMP - Replete electrolytes as indicated - Monitor I&Os - F/u urine studies - Avoid nephrotoxic agents as able - Ensure adequate renal perfusion - Concern re: long-term HD access; prior thrombosed AVF site likely not amenable to intervention. Will need tunneled Permcath for long-term.  Acute metabolic encephalopathy ? ICU delirium, ? Sepsis. Improved 2/22AM. - Delirium precautions - Continue supportive care  HLD DM2 w/ hyperglycemia  - Levemir 20U BID - TF coverage - SSI, resistant scale  Acute blood loss anemia Suspect secondary to surgery, frequent CRRT circuit changes, and  chronic kidney disease. CBC stable  - Trend CBC - Transfuse for Hgb < 7.0 or hemodynamically significant bleeding  Hx PE - ?CTEPH Thrombocytopenia - suspect CRRT related HITT panel 0.075. SRA negative.  - Continue bivalrudin (ease of keeping therapeutic) - Transition back to PO agent once more clinically stable  Inadequate oral intake related to inability to eat At risk for malnutrition - Nutrition/SLP following, appreciate assistance - Cortrakpresent, contrinue TF - FEES/instrumental assessment with SLP as able  Best Practice: (right click and "Reselect all SmartList Selections" daily)   Diet/type: tubefeeds   DVT prophylaxis: other- bivalirudin GI prophylaxis: PPI Lines: Central line, Dialysis Catheter, Arterial Line, and yes and it is still needed  Foley:  N/A Code Status:  full code Last date of multidisciplinary goals of care discussion [Family updated 2/18]   Critical care time: 37 minutes   Lestine Mount, PA-C [place] [facility] & Critical [facility] 01/19/22 7:33 AM  Please see [email or web address] for pager details.  From 7A-7P if no response, please call [phone] After hours, please call ELink [phone]

## 2022-01-19 NOTE — Progress Notes (Signed)
Nutrition Follow-up  DOCUMENTATION CODES:   Not applicable  INTERVENTION:   Tube Feeding via Cortrak:  Increase Vital 1.5 to 50 ml/hr Pro-Source TF 45 mL BID This provides 1880 kcals, 103 g of protein and 912 mL of free water  Continue Renal MVI daily   NUTRITION DIAGNOSIS:   Inadequate oral intake related to inability to eat as evidenced by NPO status.  Being addressed via TF   GOAL:   Patient will meet greater than or equal to 90% of their needs  Progressing  MONITOR:   Diet advancement, Labs, Weight trends, TF tolerance, Skin, I & O's  REASON FOR ASSESSMENT:   Consult Enteral/tube feeding initiation and management  ASSESSMENT:   66 y.o. female with history of HTN, DM type 2, HKD, pulmonary HTN, ESRD on HD, CHF, hx CVA, and vitamin D deficiency, presented to ED with radiating chest pain, SOB, and nausea.  2/07 Left heart catherization, showed severe coronary calcification and multivessel CAD. 2/10  CABG x 3, TVR 2/11  CRRT initiated 2/13 TF initiated 2/14 TF on  hold for emesis 2/15 cortrak placed (gastric), TF resumed 2/16 extubated  Remains on CRRT, lethargic today after receiving pain medicine. Pt remains on levophed, vasopressin and epinephrine.   Evaluated by SLP today, remains NPO except for sips of honey thick liquids and bites or puree from floor stock only.   Tolerating Vital 1.5 at rate of 45 ml/hr with Pro-Source TF 45 mL BID via Cortrak  EDW: 75.5 kg Current wt: 67 kg Noted some edema still present on exam; unknown dry weight  Labs: CBGs 112-299, sodium 134 (L) Meds: colace, hectoral, aranesp, ss novolog, novolog q 4 hours, levemir, midodrine, renal MVI  Diet Order:   Diet Order             Diet NPO time specified  Diet effective now                   EDUCATION NEEDS:   Not appropriate for education at this time  Skin:  Skin Assessment: Skin Integrity Issues: Skin Integrity Issues:: Incisions Incisions: closed chest, rt  leg  Last BM:  2/23 rectal tube  Height:   Ht Readings from Last 1 Encounters:  01/01/22 4' 11.5" (1.511 m)    Weight:   Wt Readings from Last 1 Encounters:  01/19/22 66.9 kg    Ideal Body Weight:  45.5 kg  BMI:  Body mass index is 29.29 kg/m.  Estimated Nutritional Needs:   Kcal:  1800-2000 kcals  Protein:  90-105 grams  Fluid:  1L+UOP   Kerman Passey MS, RDN, LDN, CNSC Registered Dietitian III Clinical Nutrition RD Pager and On-Call Pager Number Located in [place]

## 2022-01-19 NOTE — Evaluation (Signed)
Occupational Therapy Evaluation Patient Details Name: Jody Nguyen MRN: [id number] DOB: [birth year] Today's Date: 01/19/2022   History of Present Illness 66 yo female presenting 2/5 with recurrent chest pain. Pt now s/p CABG x3 and tricuspid valve annuloplasty on 2/10 requiring 6 units PRBCs, 2 units FFP, and 2 bags of platelets and resulting in vasoplegic shock with very high vasopressor requirements. Started on CRRT 2/11, extubated 2/16. PMH includes: anemia, CHF, CKD IV on HD MWF, CVA, DM II, HLD, HTN, pulmonary HTN.   Clinical Impression   PTA, pt was living with her husband and was independent. Pt currently requiring Total A for bathing, dressing, and toileting at bed level. Pt performing oral care with Mod A for support at elbow to bring LUE to mouth; tilting at 30* in kreg bed. Pt tolerating tilting at 20*, 30*, and then 40*. Participating in ROM exercises at BUE, BLE, and cervical spine. Pt would benefit from further acute OT to facilitate safe dc. Recommend dc to SNF for further OT to optimize safety, independence with ADLs, and return to PLOF.       Recommendations for follow up therapy are one component of a multi-disciplinary discharge planning process, led by the attending physician.  Recommendations may be updated based on patient status, additional functional criteria and insurance authorization.   Follow Up Recommendations  Acute inpatient rehab (3hours/day)    Assistance Recommended at Discharge Frequent or constant Supervision/Assistance  Patient can return home with the following Two people to help with walking and/or transfers;Two people to help with bathing/dressing/bathroom    Functional Status Assessment  Patient has had a recent decline in their functional status and demonstrates the ability to make significant improvements in function in a reasonable and predictable amount of time.  Equipment Recommendations  Other (comment) (Defer to next venue)     Recommendations for Other Services PT consult     Precautions / Restrictions Precautions Precautions: Fall;Sternal Precaution Comments: bilateral femoral catheters, flexiseal, cortrack, rectal tube, watch BP Restrictions Other Position/Activity Restrictions: sternal precautions      Mobility Bed Mobility Overal bed mobility: Needs Assistance             General bed mobility comments: Tilting 20*, 30*, and then 40*.    Transfers                   General transfer comment: defer      Balance                                           ADL either performed or assessed with clinical judgement   ADL Overall ADL's : Needs assistance/impaired     Grooming: Oral care;Moderate assistance;Standing Grooming Details (indicate cue type and reason): Kreg bed tilted to 30*. Mod A for support at elbow to bring LUE to mouth. Pt with decreased endurance nad strength as seen by dropping her tooh brush mutiple times.                               General ADL Comments: Total A for bathing and dressing.     Vision         Perception     Praxis      Pertinent Vitals/Pain Pain Assessment Pain Assessment: Faces Faces Pain Scale: Hurts even more Pain Location: with certain movement  including RUE ROM and cervical ROM Pain Descriptors / Indicators: Grimacing Pain Intervention(s): Monitored during session, Limited activity within patient's tolerance, Repositioned     Hand Dominance Right   Extremity/Trunk Assessment Upper Extremity Assessment Upper Extremity Assessment: RUE deficits/detail;LUE deficits/detail RUE Deficits / Details: significant edema (R > L) with poor grip strength, limited to AAROM against gravity to complete full ROM RUE Coordination: decreased fine motor;decreased gross motor LUE Deficits / Details: significant edema (R > L) with poor grip strength, limited to AAROM against gravity to complete full ROM LUE  Coordination: decreased fine motor;decreased gross motor   Lower Extremity Assessment Lower Extremity Assessment: Defer to PT evaluation   Cervical / Trunk Assessment Cervical / Trunk Assessment: Other exceptions Cervical / Trunk Exceptions: s/p thoracic surgery and large body habitus. preference for R cervical rotation and flexion, unable to rotate past midline   Communication Communication Communication: Expressive difficulties;Receptive difficulties   Cognition Arousal/Alertness: Awake/alert Behavior During Therapy: Flat affect Overall Cognitive Status: Impaired/Different from baseline Area of Impairment: Orientation, Attention, Memory, Following commands, Awareness, Safety/judgement, Problem solving                 Orientation Level: Disoriented to, Time Current Attention Level: Focused, Sustained Memory: Decreased short-term memory Following Commands: Follows one step commands inconsistently, Follows one step commands with increased time Safety/Judgement: Decreased awareness of safety, Decreased awareness of deficits Awareness: Intellectual Problem Solving: Slow processing, Difficulty sequencing, Requires verbal cues General Comments: Pt with difficulty maintaining sustained attention to talks. Unable to follow commands with other noises in room or touching her body (ie line management, linen management). Significant time for processing     General Comments  BP sitting 20* HOB elevated: 88/57. BP 106/60 tilting at 23 degrees; BP 99/81 tilting at 30 degrees, BP 105/60 tilting at 42 degrees.    Exercises Exercises: General Lower Extremity, Other exercises, General Upper Extremity General Exercises - Upper Extremity Elbow Flexion: AAROM, Both, 10 reps, Supine Wrist Flexion: AAROM, Both, 10 reps, Supine Wrist Extension: AAROM, Both, 10 reps, Supine Digit Composite Flexion: AAROM, Both, 10 reps, Supine Composite Extension: AROM, Both, 10 reps, Supine General Exercises -  Lower Extremity Ankle Circles/Pumps: Both, 10 reps, Supine Other Exercises Other Exercises: cervical AROM tilting at 42*; 3x   Shoulder Instructions      Home Living Family/patient expects to be discharged to:: Private residence Living Arrangements: Spouse/significant other Available Help at Discharge: Family;Available 24 hours/day Type of Home: House Home Access: Stairs to enter [employer] of Steps: 5 Entrance Stairs-Rails: Right;Left;Can reach both Home Layout: One level     Bathroom Shower/Tub: Occupational psychologist: Standard     Home Equipment: None          Prior Functioning/Environment Prior Level of Function : Independent/Modified Independent             Mobility Comments: pt reports no use of DME, states husband is also physically able to assist.          OT Problem List: Decreased strength;Decreased range of motion;Decreased activity tolerance;Impaired balance (sitting and/or standing);Decreased knowledge of use of DME or AE;Decreased knowledge of precautions      OT Treatment/Interventions: Self-care/ADL training;Therapeutic exercise;Energy conservation;DME and/or AE instruction;Therapeutic activities;Patient/family education    OT Goals(Current goals can be found in the care plan section) Acute Rehab OT Goals Patient Stated Goal: Unstated OT Goal Formulation: With patient Time For Goal Achievement: 02/02/22 Potential to Achieve Goals: Good  OT Frequency: Min 2X/week  Co-evaluation PT/OT/SLP Co-Evaluation/Treatment: Yes Reason for Co-Treatment: For patient/therapist safety;To address functional/ADL transfers   OT goals addressed during session: ADL's and self-care      AM-PAC OT "6 Clicks" Daily Activity     Outcome Measure Help from another person eating meals?: Total Help from another person taking care of personal grooming?: A Lot Help from another person toileting, which includes using toliet, bedpan, or urinal?:  Total Help from another person bathing (including washing, rinsing, drying)?: Total Help from another person to put on and taking off regular upper body clothing?: Total Help from another person to put on and taking off regular lower body clothing?: Total 6 Click Score: 7   End of Session Equipment Utilized During Treatment: Oxygen Nurse Communication: Mobility status  Activity Tolerance: Patient tolerated treatment well Patient left: in bed;with call bell/phone within reach;Other (comment) (bed position in kreg bed)  OT Visit Diagnosis: Other abnormalities of gait and mobility (R26.89);Unsteadiness on feet (R26.81);Muscle weakness (generalized) (M62.81)                Time: 3361-2244 OT Time Calculation (min): 40 min Charges:  OT General Charges $OT Visit: 1 Visit OT Evaluation $OT Eval High Complexity: 1 High OT Treatments $Self Care/Home Management : 8-22 mins  Jordann Grime MSOT, OTR/L Acute Rehab Pager: [phone] Office: [place] 01/19/2022, 9:10 AM

## 2022-01-19 NOTE — Progress Notes (Signed)
[facility] Progress Note  Name: Jody Nguyen MRN: [id number] DOB: 1956/05/11   Subjective: feeling okay today, worked w/ PT in the bed this am. SOB better,  down to 3 L . I/O yest +980, today -1.4 L so far.    Intake/Output Summary (Last 24 hours) at 01/19/2022 1002 Last data filed at 01/19/2022 0900 Gross per 24 hour  Intake 2846.34 ml  Output 4659 ml  Net -1812.66 ml     Vitals:  Vitals:   01/19/22 0815 01/19/22 0820 01/19/22 0830 01/19/22 0900  BP:  (!) 88/57    Pulse: (!) 106 (!) 112 (!) 104 (!) 115  Resp: 18 18 20 19   Temp:  (!) 97.5 F (36.4 C)    TempSrc:  Axillary    SpO2: 94% 94% 91% 97%  Weight:      Height:         Physical Exam:  General: tired appearing, NAD Lungs: clear ant/ lat, nasal O2 Heart S1S2  Abdomen soft obese Extremities: mild LE , LUE edema, +2 RUE edema Neuro : awake, tired, responsive Access: no bruit RUE AVF; right fem temp HD line c/d/i  Medications reviewed   Labs:  BMP Latest Ref Rng & Units 01/19/2022 01/18/2022 01/18/2022  Glucose 70 - 99 mg/dL 207(H) 154(H) -  BUN 8 - 23 mg/dL 32(H) 43(H) -  Creatinine 0.44 - 1.00 mg/dL 1.88(H) 2.65(H) -  BUN/Creat Ratio 6 - 22 (calc) - - -  Sodium 135 - 145 mmol/L 134(L) 135 135  Potassium 3.5 - 5.1 mmol/L 4.8 4.6 4.6  Chloride 98 - 111 mmol/L 100 101 -  CO2 22 - 32 mmol/L 23 22 -  Calcium 8.9 - 10.3 mg/dL 9.2 9.0 -   OP HD: MWF [place] 3:15h   450/A1.5   75.5kg   2K/2Ca bath  P2   AVF  Hep  2500 - Hectoral 22mcg IV q HD - Sensipar 90mg  PO q HD - Mircera 190mcg IV q 2 weeks (ordered, not yet given. Last Mircera 100 on 12/07/21)   Assessment/Plan:   CAD s/p CABG x3, TR sp TVR:  per CT surgery   ESRD:  normally MWF schedule, last [facility] Thursday 2/09. CRRT started 2/11. Held for 1.5 days this week which she did not tolerate well, resumed on 2/22. Continue CRRT.   Hypotension/ shock - remain on 3 pressors (epi/ vaso/ levo) and milrinone Leukocytosis-per pmd, on IV  zosyn Thrombycytopenia- resolved BP/ volume: hx of HTN, remains in shock. CVP back down w/ CRRT and O2 requirement improved.   Anemia CKD : s/p PRBC's , Hb 8.5 transfuse prn, esa ordered to start 9/14  Metabolic bone disease: resume home binders and sensipar when taking PO. Cont IV vdra.   Infiltrated AVF - AVF infiltrated on 2/7. Expert cannulator able to stick 2/09 and completed HD pre CABG. Post-op AVF is nonfunctional, VVS aware. Planning for [facility] when more stable.  Pulm HTN:  Followed at [facility], on Revatio and opsumit  T2DM-per primary service  Hx of MV replacement - in 2016  Sol Blazing, MD 01/19/2022 10:02 AM

## 2022-01-19 NOTE — Progress Notes (Addendum)
[facility] DAILY ICU PROGRESS NOTE                   [address]            [place] [address]          [phone]   13 Days Post-Op Procedure(s) (LRB): CORONARY ARTERY BYPASS GRAFTING (CABG) TIMES THREE ON CARDIOPULMONARY BYPASS. LIMA TO LAD, SVG TO PD, SVG TO DIAG (N/A) TRICUSPID VALVE REPAIR USING AN EDWARDS MC3 TRICUSPID ANNULOPLASTY RING (N/A) TRANSESOPHAGEAL ECHOCARDIOGRAM (TEE) (N/A) APPLICATION OF CELL SAVER (N/A) ENDOVEIN HARVEST OF GREATER SAPHENOUS VEIN (Right) REPAIR OF PATENT FORAMEN OVALE (N/A)  Total Length of Stay:  LOS: 18 days   Subjective: Patient undergoing CRRT  Objective: Vital signs in last 24 hours: Temp:  [96 F (35.6 C)-97.5 F (36.4 C)] 97.5 F (36.4 C) (02/23 0820) Pulse Rate:  [81-126] 112 (02/23 0820) Cardiac Rhythm: Atrial fibrillation (02/23 0800) Resp:  [13-31] 18 (02/23 0820) BP: (88-124)/(57-62) 88/57 (02/23 0820) SpO2:  [76 %-100 %] 94 % (02/23 0820) FiO2 (%):  [40 %-90 %] 40 % (02/23 0023) Weight:  [66.9 kg] 66.9 kg (02/23 0414)  Filed Weights   01/17/22 0403 01/18/22 0500 01/19/22 0414  Weight: 66.9 kg 66.9 kg 66.9 kg    Weight change: 0 kg   Hemodynamic parameters for last 24 hours: CVP:  [12 mmHg-48 mmHg] 20 mmHg  Intake/Output from previous day: 02/22 0701 - 02/23 0700 In: 3396.1 [P.O.:10; I.V.:1810.7; NG/GT:1370; IV Piggyback:205.4] Out: 4433 [Stool:425]  Intake/Output this shift: Total I/O In: 136.7 [I.V.:61.7; NG/GT:75] Out: 181 [Other:181]  Current Meds: Scheduled Meds:  aspirin EC  325 mg Oral Daily   Or   aspirin  324 mg Per Tube Daily   bisacodyl  10 mg Oral Daily   Or   bisacodyl  10 mg Rectal Daily   chlorhexidine gluconate (MEDLINE KIT)  15 mL Mouth Rinse BID   Chlorhexidine Gluconate Cloth  6 each Topical Daily   darbepoetin (ARANESP) injection - DIALYSIS  100 mcg Intravenous Q Mon-HD   docusate  200 mg Per Tube Daily   doxercalciferol  9 mcg Intravenous Q M,W,F-HD   feeding  supplement (PROSource TF)  45 mL Per Tube BID   insulin aspart  0-20 Units Subcutaneous Q4H   insulin aspart  4 Units Subcutaneous Q4H   insulin detemir  20 Units Subcutaneous Q12H   latanoprost  1 drop Both Eyes QHS   midodrine  10 mg Oral TID WC   multivitamin  1 tablet Per Tube QHS   pantoprazole sodium  40 mg Per Tube Daily   rosuvastatin  20 mg Per Tube QHS   sodium chloride flush  10-40 mL Intracatheter Q12H   sodium chloride flush  3 mL Intravenous Q12H   Thrombi-Pad  1 each Topical Once   Continuous Infusions:   prismasol BGK 4/2.5 500 mL/hr at 01/19/22 0749    prismasol BGK 4/2.5 500 mL/hr at 01/19/22 0749   sodium chloride 10 mL/hr at 01/19/22 0800   amiodarone 30 mg/hr (01/19/22 0800)   bivalirudin (ANGIOMAX) infusion 0.5 mg/mL (Non-ACS indications) 0.0135 mg/kg/hr (01/19/22 0917)   epinephrine 3 mcg/min (01/19/22 0800)   feeding supplement (VITAL 1.5 CAL) 1,000 mL (01/18/22 1730)   milrinone 0.25 mcg/kg/min (01/19/22 0800)   norepinephrine (LEVOPHED) Adult infusion 12 mcg/min (01/19/22 0800)   piperacillin-tazobactam 3.375 g (01/19/22 0446)   prismasol BGK 4/2.5 1,400 mL/hr at 01/19/22 0912   vasopressin 0.04 Units/min (01/19/22 0800)  PRN Meds:.sodium chloride, alteplase, food thickener, heparin, ondansetron (ZOFRAN) IV, oxyCODONE, sodium chloride, sodium chloride flush, sodium chloride flush, traMADol  Neurologic: intact Heart: IRRR IRRR Lungs: Diminished bibasilar breath sounds Abdomen: Soft, obese, non tender, bowel sounds Extremities: Bilateral LE edema Wound: Sternal and RLE wounds clean, dry, well healed  Lab Results: CBC: Recent Labs    01/18/22 0401 01/18/22 1451 01/19/22 0357  WBC 25.1*  --  17.1*  HGB 7.3* 8.5* 7.1*  HCT 24.2* 25.0* 23.9*  PLT 194  --  188   BMET:  Recent Labs    01/18/22 1751 01/19/22 0357  NA 135 134*  K 4.6 4.8  CL 101 100  CO2 22 23  GLUCOSE 154* 207*  BUN 43* 32*  CREATININE 2.65* 1.88*  CALCIUM 9.0 9.2     CMET: Lab Results  Component Value Date   WBC 17.1 (H) 01/19/2022   HGB 7.1 (L) 01/19/2022   HCT 23.9 (L) 01/19/2022   PLT 188 01/19/2022   GLUCOSE 207 (H) 01/19/2022   CHOL 227 (H) 12/08/2021   TRIG 103 12/08/2021   HDL 50 12/08/2021   LDLCALC 155 (H) 12/08/2021   ALT 14 01/14/2022   AST 56 (H) 01/14/2022   NA 134 (L) 01/19/2022   K 4.8 01/19/2022   CL 100 01/19/2022   CREATININE 1.88 (H) 01/19/2022   BUN 32 (H) 01/19/2022   CO2 23 01/19/2022   TSH 2.67 12/08/2021   INR 1.5 (H) 01/11/2022   HGBA1C 5.6 01/05/2022   MICROALBUR 53.5 (H) 03/29/2015      PT/INR: No results for input(s): LABPROT, INR in the last 72 hours. Radiology: No results found.   Assessment/Plan: S/P Procedure(s) (LRB): CORONARY ARTERY BYPASS GRAFTING (CABG) TIMES THREE ON CARDIOPULMONARY BYPASS. LIMA TO LAD, SVG TO PD, SVG TO DIAG (N/A) TRICUSPID VALVE REPAIR USING AN 36MM EDWARDS MC3 TRICUSPID ANNULOPLASTY RING (N/A) TRANSESOPHAGEAL ECHOCARDIOGRAM (TEE) (N/A) APPLICATION OF CELL SAVER (N/A) ENDOVEIN HARVEST OF GREATER SAPHENOUS VEIN (Right) REPAIR OF PATENT FORAMEN OVALE (N/A) CV-a fib. On Midodrine 10 mg tid. On Amiodarone, Epi, Nor Epi, Milrinone 0.25, and Vasopressin 0.04. Co ox this am decreased to 65.7 Pulmonary-on Bipap at night. On 3 liters via [place]. Pulmonary/CCM following. ID-on Zosyn for Morganella Morganii found in blood culture GI-on tube feedings. SLP following. 5. History of PE-on Bivalirudin 6. Anemia of chronic disease-H and H this am decreased to 7.1 and 23.9. On Aranesp. Monitor need for transfusion 7. History of ESRD-Creatinine this am 1.88. Nephrology following and arranging for CRRT. Has left femoral catheter in place as has infiltrated right AVF 8. History of DM-CBGs 118/196/212. On Insulin. Pre op HGA1C 6.1.  Donielle Liston Alba PA-C 01/19/2022 9:21 AM   Patient seen and examined, agree with above Has been able to wean norepi overnight Co-ox 65 ESRD- on CVVHD  Oziel Beitler  C. Roxan Hockey, MD [facility] [phone]

## 2022-01-19 NOTE — Progress Notes (Signed)
Speech Language Pathology Treatment: Dysphagia  Patient Details Name: Jody Nguyen MRN: [id number] DOB: 1956-01-03 Today's Date: 01/19/2022 Time: 1448-1856 SLP Time Calculation (min) (ACUTE ONLY): 18 min  Assessment / Plan / Recommendation Clinical Impression  Pt was seen for ongoing dysphagia management. She needed encouragement to try POs, likely fatigued from therapy sessions that she had recently had, but willing to have a few pieces of ice and spoonfuls of water. Her voice remains hoarse and she takes several swallows per bolus. She does not show overt s/s of aspiration, but trials are small and limited in quantity. Pt remains at risk for aspiration and has ongoing signs of dysphagia, and she is at a heightened risk for dysphagia related adverse events. Therefore continue to recommend instrumental testing prior to initiating any PO diet. FEES can likely be completed sooner as it can be done at bedside; however, note that pt has had limited ability to participate in PO trials over the last week due to mentation and medical issues (increased need for pressors on previous date, also restarted CRRT, had needed BiPAP). Although alertness seems to be improved since starting CRRT, would still like to see a little more stability with her alertness and medical status prior to pursuing instrumental testing. Will see how the remainder of today goes and will f/u for potential to complete FEES as able.    HPI HPI: 66 yo F presented to [facility] ED 2/4 with chest pain. ECHO revealed severe RV failure, felt to be  consistent with chronic process such as severe pulmonary HTN. 2/7 cardiac cath - three vessel dz;  2/10 CABGx3, TVR; 2/13 chest tube removed; ETT 2/10-2/16; CVVHD.  PMH ESRD on HD, non-obstructive CAD, suspected pulmonary hypertension, Hx PE on eliquis. Passed [place] on 2/16; RN had concerns for potential dysphagia and requested SLP swallow consult.      SLP Plan  Continue with current plan of  care      Recommendations for follow up therapy are one component of a multi-disciplinary discharge planning process, led by the attending physician.  Recommendations may be updated based on patient status, additional functional criteria and insurance authorization.    Recommendations  Diet recommendations: NPO (small sips of honey thick liquids or bites of purees from floor stock; can have some ice chips as well) Medication Administration: Via alternative means                Oral Care Recommendations: Oral care QID Follow Up Recommendations: Acute inpatient rehab (3hours/day) Assistance recommended at discharge: Frequent or constant Supervision/Assistance SLP Visit Diagnosis: Dysphagia, pharyngeal phase (R13.13) Plan: Continue with current plan of care           [name]., M.A. [place] [facility] Pager [phone] Office [phone]  01/19/2022, 11:42 AM

## 2022-01-19 NOTE — Progress Notes (Addendum)
Physical Therapy Treatment Patient Details Name: Jody Nguyen MRN: [id number] DOB: 04/14/1956 Today's Date: 01/19/2022   History of Present Illness 66 yo female presenting 2/5 with recurrent chest pain. Pt now s/p CABG x3 and tricuspid valve annuloplasty on 2/10 requiring 6 units PRBCs, 2 units FFP, and 2 bags of platelets and resulting in vasoplegic shock with very high vasopressor requirements. Started on CRRT 2/11, extubated 2/16. PMH includes: anemia, CHF, CKD IV on HD MWF, CVA, DM II, HLD, HTN, pulmonary HTN.    PT Comments    Patient progressing slowly towards PT goals. Session focused on tilting in Kreg bed and there ex. Pt tolerated tilting to 42 degrees with stable BP, asymptomatic throughout. Continues to demonstrate cognitive deficits relating to memory, attention, problem solving, awareness and following commands. Worked on there ex of cervical spine, UEs and LEs. Tolerated tilting time for ~20 minutes. Will continue to follow and progress as able/allowed.   Recommendations for follow up therapy are one component of a multi-disciplinary discharge planning process, led by the attending physician.  Recommendations may be updated based on patient status, additional functional criteria and insurance authorization.  Follow Up Recommendations  Acute inpatient rehab (3hours/day)     Assistance Recommended at Discharge Frequent or constant Supervision/Assistance  Patient can return home with the following Two people to help with walking and/or transfers;Two people to help with bathing/dressing/bathroom;Assistance with cooking/housework;Help with stairs or ramp for entrance;Direct supervision/assist for medications management;Direct supervision/assist for financial management;Assist for transportation   Equipment Recommendations  Other (comment) (TBA)    Recommendations for Other Services       Precautions / Restrictions Precautions Precautions: Fall;Sternal;Other  (comment) Precaution Comments: bilateral femoral catheters, flexiseal, cortrack, rectal tube, watch BP, CRRT Restrictions Weight Bearing Restrictions: No Other Position/Activity Restrictions: sternal precautions     Mobility  Bed Mobility               General bed mobility comments: Tilting 20*, 30*, and then 40*. Total A to reposition. Patient Response: Flat affect, Cooperative  Transfers                   General transfer comment: defer due to bil fem catheters    Ambulation/Gait                   Stairs             Wheelchair Mobility    Modified Rankin (Stroke Patients Only)       Balance                                            Cognition Arousal/Alertness: Awake/alert Behavior During Therapy: Flat affect Overall Cognitive Status: Impaired/Different from baseline Area of Impairment: Orientation, Attention, Memory, Following commands, Awareness, Safety/judgement, Problem solving                 Orientation Level: Disoriented to, Time Current Attention Level: Focused, Sustained Memory: Decreased short-term memory Following Commands: Follows one step commands inconsistently, Follows one step commands with increased time Safety/Judgement: Decreased awareness of safety, Decreased awareness of deficits Awareness: Intellectual Problem Solving: Slow processing, Difficulty sequencing, Requires verbal cues General Comments: Pt with difficulty maintaining sustained attention to talks. Unable to follow commands with other noises in room or touching her body (ie line management, linen management). Significant time for processing  Exercises General Exercises - Lower Extremity Ankle Circles/Pumps: Both, 10 reps, Supine, AROM Quad Sets: AROM, Both, 10 reps, Supine Gluteal Sets: AROM, Both, 10 reps, Standing Toe Raises: AROM, Both, 10 reps, Standing Other Exercises Other Exercises: cervical AROM tilting at 42*;  3x    General Comments General comments (skin integrity, edema, etc.): BP sitting 20* HOB elevated: 88/57. BP 106/60 tilting at 23 degrees; BP 99/81 tilting at 30 degrees, BP 105/60 tilting at 42 degrees.      Pertinent Vitals/Pain Pain Assessment Pain Assessment: Faces Faces Pain Scale: Hurts even more Pain Location: with certain movement including RUE ROM and cervical ROM Pain Descriptors / Indicators: Grimacing Pain Intervention(s): Monitored during session, Limited activity within patient's tolerance, Repositioned    Home Living Family/patient expects to be discharged to:: Private residence Living Arrangements: Spouse/significant other Available Help at Discharge: Family;Available 24 hours/day Type of Home: House Home Access: Stairs to enter Entrance Stairs-Rails: Right;Left;Can reach both Entrance Stairs-Number of Steps: 5   Home Layout: One level Home Equipment: None      Prior Function            PT Goals (current goals can now be found in the care plan section) Progress towards PT goals: Progressing toward goals    Frequency    Min 2X/week      PT Plan Current plan remains appropriate    Co-evaluation PT/OT/SLP Co-Evaluation/Treatment: Yes Reason for Co-Treatment: For patient/therapist safety;To address functional/ADL transfers PT goals addressed during session: Mobility/safety with mobility;Strengthening/ROM OT goals addressed during session: ADL's and self-care      AM-PAC PT "6 Clicks" Mobility   Outcome Measure  Help needed turning from your back to your side while in a flat bed without using bedrails?: Total Help needed moving from lying on your back to sitting on the side of a flat bed without using bedrails?: Total Help needed moving to and from a bed to a chair (including a wheelchair)?: Total Help needed standing up from a chair using your arms (e.g., wheelchair or bedside chair)?: Total Help needed to walk in hospital room?: Total Help  needed climbing 3-5 steps with a railing? : Total 6 Click Score: 6    End of Session Equipment Utilized During Treatment: Oxygen Activity Tolerance: Patient tolerated treatment well Patient left: in bed;with call bell/phone within reach Nurse Communication: Mobility status PT Visit Diagnosis: Unsteadiness on feet (R26.81);Other abnormalities of gait and mobility (R26.89);Muscle weakness (generalized) (M62.81)     Time: 0737-1062 PT Time Calculation (min) (ACUTE ONLY): 43 min  Charges:  $Therapeutic Activity: 8-22 mins                     Marisa Severin, PT, DPT Acute Rehabilitation Services Pager [phone] Office [phone]      Marguarite Arbour A Camilia Caywood 01/19/2022, 10:00 AM

## 2022-01-20 ENCOUNTER — Telehealth: Payer: Self-pay

## 2022-01-20 DIAGNOSIS — I5042 Chronic combined systolic (congestive) and diastolic (congestive) heart failure: Secondary | ICD-10-CM | POA: Diagnosis not present

## 2022-01-20 DIAGNOSIS — R7881 Bacteremia: Secondary | ICD-10-CM | POA: Diagnosis not present

## 2022-01-20 DIAGNOSIS — R570 Cardiogenic shock: Secondary | ICD-10-CM | POA: Diagnosis not present

## 2022-01-20 DIAGNOSIS — R072 Precordial pain: Secondary | ICD-10-CM | POA: Diagnosis not present

## 2022-01-20 LAB — RENAL FUNCTION PANEL
Albumin: 2.5 g/dL — ABNORMAL LOW (ref 3.5–5.0)
Albumin: 2.4 g/dL — ABNORMAL LOW (ref 3.5–5.0)
Anion gap: 9 (ref 5–15)
Anion gap: 12 (ref 5–15)
BUN: 18 mg/dL (ref 8–23)
BUN: 17 mg/dL (ref 8–23)
CO2: 25 mmol/L (ref 22–32)
CO2: 21 mmol/L — ABNORMAL LOW (ref 22–32)
Calcium: 9.6 mg/dL (ref 8.9–10.3)
Calcium: 9.4 mg/dL (ref 8.9–10.3)
Chloride: 101 mmol/L (ref 98–111)
Chloride: 103 mmol/L (ref 98–111)
Creatinine, Ser: 1.2 mg/dL — ABNORMAL HIGH (ref 0.44–1.00)
Creatinine, Ser: 1.23 mg/dL — ABNORMAL HIGH (ref 0.44–1.00)
GFR, Estimated: 50 mL/min — ABNORMAL LOW (ref >60)
GFR, Estimated: 48 mL/min — ABNORMAL LOW (ref >60)
Glucose, Bld: 168 mg/dL — ABNORMAL HIGH (ref 70–99)
Glucose, Bld: 176 mg/dL — ABNORMAL HIGH (ref 70–99)
Phosphorus: 2.5 mg/dL (ref 2.5–4.6)
Phosphorus: 2.6 mg/dL (ref 2.5–4.6)
Potassium: 4.5 mmol/L (ref 3.5–5.1)
Potassium: 4.5 mmol/L (ref 3.5–5.1)
Sodium: 135 mmol/L (ref 135–145)
Sodium: 136 mmol/L (ref 135–145)

## 2022-01-20 LAB — POCT I-STAT 7, (LYTES, BLD GAS, ICA,H+H)
Acid-Base Excess: 2 mmol/L (ref 0.0–2.0)
Acid-base deficit: 2 mmol/L (ref 0.0–2.0)
Acid-base deficit: 1 mmol/L (ref 0.0–2.0)
Bicarbonate: 26.3 mmol/L (ref 20.0–28.0)
Bicarbonate: 22.6 mmol/L (ref 20.0–28.0)
Bicarbonate: 23.6 mmol/L (ref 20.0–28.0)
Calcium, Ion: 1.31 mmol/L (ref 1.15–1.40)
Calcium, Ion: 1.31 mmol/L (ref 1.15–1.40)
Calcium, Ion: 1.27 mmol/L (ref 1.15–1.40)
HCT: 24 % — ABNORMAL LOW (ref 36.0–46.0)
HCT: 24 % — ABNORMAL LOW (ref 36.0–46.0)
HCT: 23 % — ABNORMAL LOW (ref 36.0–46.0)
Hemoglobin: 8.2 g/dL — ABNORMAL LOW (ref 12.0–15.0)
Hemoglobin: 8.2 g/dL — ABNORMAL LOW (ref 12.0–15.0)
Hemoglobin: 7.8 g/dL — ABNORMAL LOW (ref 12.0–15.0)
O2 Saturation: 99 %
O2 Saturation: 79 %
O2 Saturation: 96 %
Patient temperature: 96.5
Patient temperature: 97.9
Patient temperature: 98.3
Potassium: 4.5 mmol/L (ref 3.5–5.1)
Potassium: 4.5 mmol/L (ref 3.5–5.1)
Potassium: 4.5 mmol/L (ref 3.5–5.1)
Sodium: 135 mmol/L (ref 135–145)
Sodium: 136 mmol/L (ref 135–145)
Sodium: 136 mmol/L (ref 135–145)
TCO2: 27 mmol/L (ref 22–32)
TCO2: 24 mmol/L (ref 22–32)
TCO2: 25 mmol/L (ref 22–32)
pCO2 arterial: 38.8 mmHg (ref 32–48)
pCO2 arterial: 37.6 mmHg (ref 32–48)
pCO2 arterial: 35.9 mmHg (ref 32–48)
pH, Arterial: 7.434 (ref 7.35–7.45)
pH, Arterial: 7.386 (ref 7.35–7.45)
pH, Arterial: 7.425 (ref 7.35–7.45)
pO2, Arterial: 135 mmHg — ABNORMAL HIGH (ref 83–108)
pO2, Arterial: 43 mmHg — ABNORMAL LOW (ref 83–108)
pO2, Arterial: 80 mmHg — ABNORMAL LOW (ref 83–108)

## 2022-01-20 LAB — CBC WITH DIFFERENTIAL/PLATELET
Abs Immature Granulocytes: 0.32 10*3/uL — ABNORMAL HIGH (ref 0.00–0.07)
Basophils Absolute: 0.1 10*3/uL (ref 0.0–0.1)
Basophils Relative: 1 %
Eosinophils Absolute: 0.4 10*3/uL (ref 0.0–0.5)
Eosinophils Relative: 3 %
HCT: 23.8 % — ABNORMAL LOW (ref 36.0–46.0)
Hemoglobin: 7.2 g/dL — ABNORMAL LOW (ref 12.0–15.0)
Immature Granulocytes: 2 %
Lymphocytes Relative: 5 %
Lymphs Abs: 0.8 10*3/uL (ref 0.7–4.0)
MCH: 29.6 pg (ref 26.0–34.0)
MCHC: 30.3 g/dL (ref 30.0–36.0)
MCV: 97.9 fL (ref 80.0–100.0)
Monocytes Absolute: 1.3 10*3/uL — ABNORMAL HIGH (ref 0.1–1.0)
Monocytes Relative: 9 %
Neutro Abs: 11.3 10*3/uL — ABNORMAL HIGH (ref 1.7–7.7)
Neutrophils Relative %: 80 %
Platelets: 208 10*3/uL (ref 150–400)
RBC: 2.43 MIL/uL — ABNORMAL LOW (ref 3.87–5.11)
RDW: 23.1 % — ABNORMAL HIGH (ref 11.5–15.5)
WBC: 14.2 10*3/uL — ABNORMAL HIGH (ref 4.0–10.5)
nRBC: 12.7 % — ABNORMAL HIGH (ref 0.0–0.2)

## 2022-01-20 LAB — GLUCOSE, CAPILLARY
Glucose-Capillary: 141 mg/dL — ABNORMAL HIGH (ref 70–99)
Glucose-Capillary: 146 mg/dL — ABNORMAL HIGH (ref 70–99)
Glucose-Capillary: 146 mg/dL — ABNORMAL HIGH (ref 70–99)
Glucose-Capillary: 154 mg/dL — ABNORMAL HIGH (ref 70–99)
Glucose-Capillary: 165 mg/dL — ABNORMAL HIGH (ref 70–99)
Glucose-Capillary: 179 mg/dL — ABNORMAL HIGH (ref 70–99)

## 2022-01-20 LAB — COOXEMETRY PANEL
Carboxyhemoglobin: 1.2 % (ref 0.5–1.5)
Methemoglobin: 2.2 % — ABNORMAL HIGH (ref 0.0–1.5)
O2 Saturation: 76.1 %
Total hemoglobin: 8 g/dL — ABNORMAL LOW (ref 12.0–16.0)

## 2022-01-20 LAB — APTT: aPTT: 51 seconds — ABNORMAL HIGH (ref 24–36)

## 2022-01-20 LAB — MAGNESIUM
Magnesium: 2.6 mg/dL — ABNORMAL HIGH (ref 1.7–2.4)
Magnesium: 2.6 mg/dL — ABNORMAL HIGH (ref 1.7–2.4)

## 2022-01-20 MED ORDER — HYDROXYZINE HCL 10 MG/5ML PO SYRP
10.0000 mg | ORAL_SOLUTION | Freq: Four times a day (QID) | ORAL | Status: DC | PRN
  Administered 2022-01-20 – 2022-02-01 (×19): 10 mg
  Filled 2022-01-20 (×24): qty 5

## 2022-01-20 NOTE — Progress Notes (Addendum)
[facility] Progress Note Patient Name: Jody Nguyen DOB: 1956-10-31 MRN: [id number]   Date of Service  01/20/2022  HPI/Events of Note  Notified of ABG results on 4L nasal cannula.  ABG 7.425/35.9/80 which is improved from earlier.  eICU Interventions  Continue O2.      Intervention Category Intermediate Interventions: Diagnostic test evaluation  Elsie Lincoln 01/20/2022, 8:20 PM  4:59 AM Notified of anemia with hgb dropping from 7.8 --> 7.0.  Pt remains on levophed, epinephrine, and milrinone.  Plan> Transfuse 1 unit pRBC. Type and screen ordered.

## 2022-01-20 NOTE — Progress Notes (Signed)
Speech Language Pathology Treatment: Dysphagia  Patient Details Name: Jody Nguyen MRN: [id number] DOB: Dec 23, 1955 Today's Date: 01/20/2022 Time: 2707-8675 SLP Time Calculation (min) (ACUTE ONLY): 39 min  Assessment / Plan / Recommendation Clinical Impression  SLP returned this afternoon to attempt FEES, but unfortunately pt was not able to tolerate passing of the scope through either nare. She had a lot of dried secretions bilaterally and SLP provided cues to try to clear her nose, but pt was not able to do so. Pt ultimately refused to allow SLP to attempt again despite encouragement from therapist and spouse.   SLP provided education to pt/husband about current POC, including current recommendations for limited amounts of purees, honey thick liquids, and ice chips for pleasure. Her husband acknowledges that this is with some risk, as we have not been able to test her swallowing more formally yet, but that she has not had a dysphagia related adverse event yet and she does not have overt s/s of aspiration with these consistencies. She will continue to be monitored closely. Education was also reinforced about the recommendation for instrumental testing prior to initiating a full diet. Note that since she is currently on CRRT, she would not be a candidate to go to radiology for an MBS until she is either able to stop or at least pause CRRT. We will try to coordinate this as soon as possible. FEES could be reconsidered if pt is willing. Suspect that if we could clear some of the dried secretions from her nose that it might be more tolerable. Husband is in agreement with current plan, although wants to help move pt forward as much as possible.    HPI HPI: 66 yo F presented to [facility] ED 2/4 with chest pain. ECHO revealed severe RV failure, felt to be  consistent with chronic process such as severe pulmonary HTN. 2/7 cardiac cath - three vessel dz;  2/10 CABGx3, TVR; 2/13 chest tube removed; ETT 2/10-2/16;  CVVHD.  PMH ESRD on HD, non-obstructive CAD, suspected pulmonary hypertension, Hx PE on eliquis. Passed [place] on 2/16; RN had concerns for potential dysphagia and requested SLP swallow consult.      SLP Plan  Continue with current plan of care      Recommendations for follow up therapy are one component of a multi-disciplinary discharge planning process, led by the attending physician.  Recommendations may be updated based on patient status, additional functional criteria and insurance authorization.    Recommendations  Diet recommendations: Other(comment) (small sips of honey thick liquids or bites of purees from floor stock; can have some ice chips as well) Medication Administration: Via alternative means                Oral Care Recommendations: Oral care QID Follow Up Recommendations: Acute inpatient rehab (3hours/day) Assistance recommended at discharge: Frequent or constant Supervision/Assistance SLP Visit Diagnosis: Dysphagia, pharyngeal phase (R13.13) Plan: Continue with current plan of care           [name]., M.A. [place] Acute Rehabilitation Services Pager [phone] Office [phone]  01/20/2022, 3:46 PM

## 2022-01-20 NOTE — Progress Notes (Signed)
Pharmacy Antibiotic Note  Jody Nguyen is a 66 y.o. female admitted on 12/30/2021 with Morganella bacteremia and Pharmacy has been consulted for and zosyn dosing.  Patient is on CRRT with appropriate Zosyn dosing of 3.375g IV q 6 hrs.    Plan: Continue Zosyn 3.375 g IV q 6 hrs. Planning 10 days of therapy per CCM team - added stop date of 2/26. Monitor renal fxn, dialysis plans, clinical progress  Height: 4' 11.5" (151.1 cm) Weight: 66.9 kg (147 lb 7.8 oz) (unable to weigh) IBW/kg (Calculated) : 44.35  Temp (24hrs), Avg:97 F (36.1 C), Min:96.3 F (35.7 C), Max:97.8 F (36.6 C)  Recent Labs  Lab 01/13/22 1646 01/13/22 2358 01/16/22 0340 01/16/22 1158 01/16/22 1612 01/17/22 0348 01/17/22 1634 01/18/22 0401 01/18/22 1751 01/19/22 0357 01/19/22 1554 01/20/22 0444  WBC  --    < > 21.4*  --   --  20.4*  --  25.1*  --  17.1*  --  14.2*  CREATININE  --    < > 1.33*  --    < > 2.20*   < > 3.58* 2.65* 1.88* 1.40* 1.20*  LATICACIDVEN 1.4  --   --  1.3  --   --   --   --   --   --   --   --    < > = values in this interval not displayed.     Estimated Creatinine Clearance: 38.9 mL/min (A) (by C-G formula based on SCr of 1.2 mg/dL (H)).    Allergies  Allergen Reactions   Minoxidil Other (See Comments)    Pericardial effusion   Lipitor [Atorvastatin] Other (See Comments)    MYALGIAS > "pain in legs" Tolerates rosuvastatin   Morphine And Related Itching   Ace Inhibitors Other (See Comments)    REACTION: "not sure...think it made me drowsy all the time"   Vancomycin 2/17 > 2/18 Zosyn 2/17 >  2/17 BCx x 2 > morganella - S to zosyn 2/17 MRSA PCR neg  [place] Crane, Vena Austria, BCPS, [facility] Clinical Pharmacist  01/20/2022 11:22 AM   [facility] pharmacy phone numbers are listed on [email or web address]

## 2022-01-20 NOTE — Progress Notes (Signed)
[place] for bivalirudin Indication: history of pulmonary embolus 2021  Allergies  Allergen Reactions   Minoxidil Other (See Comments)    Pericardial effusion   Lipitor [Atorvastatin] Other (See Comments)    MYALGIAS > "pain in legs" Tolerates rosuvastatin   Morphine And Related Itching   Ace Inhibitors Other (See Comments)    REACTION: "not sure...think it made me drowsy all the time"    Patient Measurements: Height: 4' 11.5" (151.1 cm) Weight: 66.9 kg (147 lb 7.8 oz) (unable to weigh) IBW/kg (Calculated) : 44.35  Vital Signs: Temp: 96.3 F (35.7 C) (02/23 2337) Temp Source: Axillary (02/23 2337) BP: 114/62 (02/24 0800) Pulse Rate: 97 (02/24 1015)  Labs: Recent Labs    01/18/22 0401 01/18/22 1451 01/19/22 0357 01/19/22 1554 01/19/22 2017 01/19/22 2110 01/20/22 0444  HGB 7.3*   < > 7.1* 7.6* 8.5* 8.2* 7.2*  HCT 24.2*   < > 23.9* 24.7* 25.0* 24.0* 23.8*  PLT 194  --  188  --   --   --  208  APTT 59*  --  56*  --   --   --  51*  CREATININE 3.58*   < > 1.88* 1.40*  --   --  1.20*   < > = values in this interval not displayed.     Estimated Creatinine Clearance: 38.9 mL/min (A) (by C-G formula based on SCr of 1.2 mg/dL (H)).   Assessment: 66 yo female on chronic apixaban for hx of PE 2021  Apixaban has been held for surgery.  Pharmacy asked to resume anticoagulation 2/15 with bivalirudin.  HIT antibody and SRA both negative.  Platelet count now recovered 50>208.  aPTT this morning therapeutic at 51 sec on bivalirudin 0.0135 mg/kg/hr. Hemoglobin low stable 7-8  No bleeding or complications currently noted.  Goal of Therapy:  aPTT goal ~50-70sec - target lower end given low platelet count/risk of post-operative bleeding Monitor platelets by anticoagulation protocol: Yes   Plan:  -Continue bival at 0.0135 mg/kg/hr -aPTT and CBC daily while on bival -Monitor for s/x of bleeding  Uvaldo Rising, BCPS,  [facility] Clinical Pharmacist  01/20/2022 11:12 AM   [facility] pharmacy phone numbers are listed on [email or web address]

## 2022-01-20 NOTE — Progress Notes (Signed)
Speech Language Pathology Treatment: Dysphagia  Patient Details Name: Jody Nguyen MRN: [id number] DOB: Apr 12, 1956 Today's Date: 01/20/2022 Time: 0851-0900 SLP Time Calculation (min) (ACUTE ONLY): 9 min  Assessment / Plan / Recommendation Clinical Impression  Pt was seen this morning with ice chip trials, again with no overt s/s of aspiration. Her responses are sometimes delayed and she often nods "yes" in response to questions, but is participatory. Risk for dysphagia persists. Discussed timing of instrumental testing with RN as well as PCCM PA, who believes that pt is medically stable enough to proceed with testing today. FEES, which can be completed at bedside, is tentatively planned for this afternoon.  Would continue to allow only small amounts of ice, purees, and/or sips of honey thick liquids from floor stock for pleasure pending completion of further testing.    HPI HPI: 66 yo F presented to [facility] ED 2/4 with chest pain. ECHO revealed severe RV failure, felt to be  consistent with chronic process such as severe pulmonary HTN. 2/7 cardiac cath - three vessel dz;  2/10 CABGx3, TVR; 2/13 chest tube removed; ETT 2/10-2/16; CVVHD.  PMH ESRD on HD, non-obstructive CAD, suspected pulmonary hypertension, Hx PE on eliquis. Passed [place] on 2/16; RN had concerns for potential dysphagia and requested SLP swallow consult.      SLP Plan  Other (Comment) (FEES)      Recommendations for follow up therapy are one component of a multi-disciplinary discharge planning process, led by the attending physician.  Recommendations may be updated based on patient status, additional functional criteria and insurance authorization.    Recommendations  Diet recommendations: Other(comment) (small sips of honey thick liquids or bites of purees from floor stock; can have some ice chips as well) Medication Administration: Via alternative means                Oral Care Recommendations: Oral care  QID Follow Up Recommendations: Acute inpatient rehab (3hours/day) Assistance recommended at discharge: Frequent or constant Supervision/Assistance SLP Visit Diagnosis: Dysphagia, pharyngeal phase (R13.13) Plan: Other (Comment) (FEES)           [name]., M.A. [place] Acute Rehabilitation Services Pager [phone] Office [phone]  01/20/2022, 9:03 AM

## 2022-01-20 NOTE — Progress Notes (Signed)
CT surgery PM rounds  Patient resting comfortably on CV VH and tube feeds Hemodynamics stable on low-dose inotropes including milrinone norepinephrine and epinephrine. Controlled rate A-fib on IV amiodarone Continue current care  Blood pressure (!) 93/52, pulse (!) 101, temperature 97.6 F (36.4 C), temperature source Axillary, resp. rate 16, height 4' 11.5" (1.511 m), weight 66.9 kg, SpO2 100 %.

## 2022-01-20 NOTE — Progress Notes (Signed)
[facility] Progress Note  Name: Jody Nguyen MRN: [id number] DOB: 03-Nov-1956   Subjective: feeling okay today, pressors down some.  I/O net neg 2.3 L yest.    Intake/Output Summary (Last 24 hours) at 01/20/2022 1215 Last data filed at 01/20/2022 1100 Gross per 24 hour  Intake 3266.77 ml  Output 4964 ml  Net -1697.23 ml     Vitals:  Vitals:   01/20/22 1045 01/20/22 1100 01/20/22 1115 01/20/22 1130  BP:      Pulse: 95 97 96 94  Resp: 14 14 19  (!) 22  Temp:    97.6 F (36.4 C)  TempSrc:    Axillary  SpO2: 100% 100% 100% 100%  Weight:      Height:         Physical Exam:  General: tired appearing, NAD, nasal O2 and cortrak Lungs: clear ant/ lat, nasal O2 Heart S1S2  Abdomen soft obese Extremities: mild LE , LUE edema, +2 RUE edema Neuro : awake, tired, responsive Access: no bruit RUE AVF; right fem temp HD line c/d/i  OP HD: MWF [place] 3:15h   450/A1.5   75.5kg   2K/2Ca bath  P2   AVF  Hep  2500 - Hectoral 31mcg IV q HD - Sensipar 90mg  PO q HD - Mircera 116mcg IV q 2 weeks (ordered, not yet given. Last Mircera 100 on 12/07/21)    Assessment/Plan:   CAD s/p CABG x3, TR sp TVR:  per CT surgery   ESRD:  normally MWF schedule, last [facility] Thursday 2/09. CRRT started 2/11. Did not tolerate CRRT holiday, resumed 2/22. Continue CRRT.   Hypotension/ shock - remain on 3 pressors (epi/ vaso/ levo) and milrinone Leukocytosis-per pmd, on IV zosyn Thrombycytopenia- resolved BP/ volume: hx of HTN, remains in shock. Continue 50-75 cc/hr UF as tolerated.   Anemia CKD : s/p PRBC's , Hb 8.5 transfuse prn, esa ordered to start 7/51  Metabolic bone disease: resume home binders and sensipar when taking PO. Cont IV vdra.   Infiltrated AVF - AVF infiltrated on 2/7. Expert cannulator able to stick 2/09 and completed HD pre CABG. Post-op AVF is nonfunctional, VVS aware. Planning for [facility] when more stable.  Pulm HTN:  Followed at [facility], on Revatio and opsumit  T2DM-per primary  service  Hx of MV replacement - in 2016  Kelly Splinter, MD 01/20/2022, 12:16 PM      Medications reviewed   Labs:  BMP Latest Ref Rng & Units 01/20/2022 01/19/2022 01/19/2022  Glucose 70 - 99 mg/dL 168(H) - -  BUN 8 - 23 mg/dL 18 - -  Creatinine 0.44 - 1.00 mg/dL 1.20(H) - -  BUN/Creat Ratio 6 - 22 (calc) - - -  Sodium 135 - 145 mmol/L 135 135 135  Potassium 3.5 - 5.1 mmol/L 4.5 4.5 4.5  Chloride 98 - 111 mmol/L 101 - -  CO2 22 - 32 mmol/L 25 - -  Calcium 8.9 - 10.3 mg/dL 9.6 - -     Sol Blazing, MD 01/20/2022 12:15 PM

## 2022-01-20 NOTE — Progress Notes (Addendum)
TCTS DAILY ICU PROGRESS NOTE                   [address]            [place] [address]          [phone]   14 Days Post-Op Procedure(s) (LRB): CORONARY ARTERY BYPASS GRAFTING (CABG) TIMES THREE ON CARDIOPULMONARY BYPASS. LIMA TO LAD, SVG TO PD, SVG TO DIAG (N/A) TRICUSPID VALVE REPAIR USING AN 36MM EDWARDS MC3 TRICUSPID ANNULOPLASTY RING (N/A) TRANSESOPHAGEAL ECHOCARDIOGRAM (TEE) (N/A) APPLICATION OF CELL SAVER (N/A) ENDOVEIN HARVEST OF GREATER SAPHENOUS VEIN (Right) REPAIR OF PATENT FORAMEN OVALE (N/A)  Total Length of Stay:  LOS: 19 days   Subjective: Patient undergoing CRRT  Objective: Vital signs in last 24 hours: Temp:  [96.3 F (35.7 C)-97.8 F (36.6 C)] 96.3 F (35.7 C) (02/23 2337) Pulse Rate:  [78-127] 98 (02/24 0645) Cardiac Rhythm: Atrial fibrillation (02/24 0400) Resp:  [12-29] 19 (02/24 0645) BP: (81-118)/(49-82) 106/68 (02/24 0400) SpO2:  [82 %-100 %] 100 % (02/24 0645) Weight:  [66.9 kg] 66.9 kg (02/24 0339)  Filed Weights   01/18/22 0500 01/19/22 0414 01/20/22 0339  Weight: 66.9 kg 66.9 kg 66.9 kg    Weight change: 0 kg   Hemodynamic parameters for last 24 hours: CVP:  [12 mmHg-39 mmHg] 22 mmHg  Intake/Output from previous day: 02/23 0701 - 02/24 0700 In: 3059.4 [I.V.:1474.4; NG/GT:1385; IV Piggyback:200] Out: 0355 [Stool:475]  Intake/Output this shift: No intake/output data recorded.  Current Meds: Scheduled Meds:  aspirin EC  325 mg Oral Daily   Or   aspirin  324 mg Per Tube Daily   bisacodyl  10 mg Oral Daily   Or   bisacodyl  10 mg Rectal Daily   chlorhexidine gluconate (MEDLINE KIT)  15 mL Mouth Rinse BID   Chlorhexidine Gluconate Cloth  6 each Topical Daily   darbepoetin (ARANESP) injection - DIALYSIS  100 mcg Intravenous Q Mon-HD   docusate  200 mg Per Tube Daily   doxercalciferol  9 mcg Intravenous Q M,W,F-HD   feeding supplement (PROSource TF)  45 mL Per Tube BID   insulin aspart  0-20 Units Subcutaneous  Q4H   insulin aspart  4 Units Subcutaneous Q4H   insulin detemir  20 Units Subcutaneous Q12H   latanoprost  1 drop Both Eyes QHS   midodrine  10 mg Oral TID WC   multivitamin  1 tablet Per Tube QHS   pantoprazole sodium  40 mg Per Tube Daily   rosuvastatin  20 mg Per Tube QHS   sodium chloride flush  10-40 mL Intracatheter Q12H   sodium chloride flush  3 mL Intravenous Q12H   Thrombi-Pad  1 each Topical Once   Continuous Infusions:   prismasol BGK 4/2.5 500 mL/hr at 01/20/22 0456    prismasol BGK 4/2.5 500 mL/hr at 01/20/22 0456   sodium chloride 10 mL/hr at 01/20/22 0700   amiodarone 30 mg/hr (01/20/22 0700)   bivalirudin (ANGIOMAX) infusion 0.5 mg/mL (Non-ACS indications) 0.0135 mg/kg/hr (01/20/22 0700)   epinephrine 3 mcg/min (01/20/22 0700)   feeding supplement (VITAL 1.5 CAL) 1,000 mL (01/19/22 1624)   milrinone 0.25 mcg/kg/min (01/20/22 0700)   norepinephrine (LEVOPHED) Adult infusion 11 mcg/min (01/20/22 0700)   piperacillin-tazobactam Stopped (01/20/22 0528)   prismasol BGK 4/2.5 1,400 mL/hr at 01/20/22 0725   vasopressin 0.04 Units/min (01/20/22 0700)   PRN Meds:.sodium chloride, alteplase, food thickener, heparin, ondansetron (ZOFRAN) IV, oxyCODONE, sodium chloride, sodium chloride flush,  sodium chloride flush, traMADol  Neurologic: intact Heart:RRR Lungs: Diminished bibasilar breath sounds Abdomen: Soft, obese, non tender, bowel sounds Extremities: Bilateral LE edema. Right forearm ++ edema (AVF) Wound: Sternal and RLE wounds clean, dry, well healed  Lab Results: CBC: Recent Labs    01/19/22 0357 01/19/22 1554 01/19/22 2110 01/20/22 0444  WBC 17.1*  --   --  14.2*  HGB 7.1*   < > 8.2* 7.2*  HCT 23.9*   < > 24.0* 23.8*  PLT 188  --   --  208   < > = values in this interval not displayed.    BMET:  Recent Labs    01/19/22 1554 01/19/22 2017 01/19/22 2110 01/20/22 0444  NA 136   < > 135 135  K 5.3*   < > 4.5 4.5  CL 101  --   --  101  CO2 26  --    --  25  GLUCOSE 111*  --   --  168*  BUN 22  --   --  18  CREATININE 1.40*  --   --  1.20*  CALCIUM 9.4  --   --  9.6   < > = values in this interval not displayed.     CMET: Lab Results  Component Value Date   WBC 14.2 (H) 01/20/2022   HGB 7.2 (L) 01/20/2022   HCT 23.8 (L) 01/20/2022   PLT 208 01/20/2022   GLUCOSE 168 (H) 01/20/2022   CHOL 227 (H) 12/08/2021   TRIG 103 12/08/2021   HDL 50 12/08/2021   LDLCALC 155 (H) 12/08/2021   ALT 14 01/14/2022   AST 56 (H) 01/14/2022   NA 135 01/20/2022   K 4.5 01/20/2022   CL 101 01/20/2022   CREATININE 1.20 (H) 01/20/2022   BUN 18 01/20/2022   CO2 25 01/20/2022   TSH 2.67 12/08/2021   INR 1.5 (H) 01/11/2022   HGBA1C 5.6 01/05/2022   MICROALBUR 53.5 (H) 03/29/2015      PT/INR: No results for input(s): LABPROT, INR in the last 72 hours. Radiology: No results found.   Assessment/Plan: S/P Procedure(s) (LRB): CORONARY ARTERY BYPASS GRAFTING (CABG) TIMES THREE ON CARDIOPULMONARY BYPASS. LIMA TO LAD, SVG TO PD, SVG TO DIAG (N/A) TRICUSPID VALVE REPAIR USING AN 36MM EDWARDS MC3 TRICUSPID ANNULOPLASTY RING (N/A) TRANSESOPHAGEAL ECHOCARDIOGRAM (TEE) (N/A) APPLICATION OF CELL SAVER (N/A) ENDOVEIN HARVEST OF GREATER SAPHENOUS VEIN (Right) REPAIR OF PATENT FORAMEN OVALE (N/A) CV-A fib. SR/PVCs this am. On Midodrine 10 mg tid. On Amiodarone, Epi, Nor Epi, Milrinone 0.25, and Vasopressin 0.04. Co ox this am increased to 76.1 Pulmonary-on Bipap at night. On 3 liters via [place]. Pulmonary/CCM following. Encourage incentive spirometer ID-on Zosyn for Morganella Morganii found in blood culture GI-NPO (small sips of honey thick liquids or bites of purees), on tube feedings. SLP following and recommendations followed accordingly. 5. History of PE-on Bivalirudin 6. Anemia of chronic disease-H and H this am decreased to 7.2 and 23.8. On Aranesp.  7. History of ESRD-Creatinine this am 1.88. Nephrology following and arranging for CRRT. Has left  femoral catheter in place as has infiltrated right AVF 8. History of DM-CBGs 107/122/154. On Insulin. Pre op HGA1C 6.1.  9. Patient with anxiety. Will discuss if able to give low dose Xanax or Ativan PRN  Nani Skillern PA-C 01/20/2022 7:50 AM   Patient seen and examined, agree with above More alert and interactive today Co-ox looks good, still on pressors but has been able to wean norepi a  little C/o itching, no rash- will give atarax PRN  Remo Lipps C. Roxan Hockey, MD [facility] [phone]

## 2022-01-20 NOTE — Progress Notes (Signed)
NAME:  Jody Nguyen, MRN:  [id number], DOB:  1955-12-29, LOS: 44 ADMISSION DATE:  01/14/2022, CONSULTATION DATE:  01/16/2022 REFERRING MD:  Marlowe Sax - TRH, CHIEF COMPLAINT:  SOB   History of Present Illness:  66 year old woman who presented to [facility] ED 2/4 with CP radiating to her arm. PMHx significant for  ESRD (on HD), non-obstructive CAD, suspected pulmonary hypertension, PE (on Eliquis). Recent ED presentation 2/1 for similar symptoms (troponin 100s). F/u with Cardiology as an outpatient 2/2 and CP was felt to be related to volume shift (occurring during dialysis).   On ED presentation 2/4, patient reported episodic chest pain, non-exertional and intermittent x 1 week with associated nausea and SOB. Non-specific EKG changes. Cardiology consulted, STEMI ruled out. Troponin elevated at 200. Placed on [place] for SpO2 80s. Admitted to [facility] for observation with concern for PE and started on heparin gtt. VQ with low probability for PE. Echo 2/19 demonstrated severe RV/RA enlargement, severely reduced RV function and septal bowing; felt to be consistent with chronic process such as severe pulmonary HTN.    PCCM has been consulted in this setting.  Pertinent Medical History:  ESRD PE Pulmonary HTN  CAD CVA HLD HTN S/p MV repair  Pericardial effusion r/t minoxidil  Diastolic HF DM2  Significant Hospital Events: Including procedures, antibiotic start and stop dates in addition to other pertinent events   2/4 ED presentation with recurring chest pain -- STEMI ruled out. Stable, mildly hypoxic and placed on [place] 2/5 Admit to [facility] for obvs. Concern for PE, ECHO performed by cardiology reportedly shows RV failure, but felt more consistent with chronic process like severe pulmHTN. Awaiting VQ scan. Pulm consulted  2/7 Cardiac cath. 3 vessel dz. CABG rec 2/10 CABG, TVR 2/11 Continued vasoplegic shock with very high vasopressor requirements  2/12 Given methylene blue x 2 2/13 Chest tube removed 2/14 JP  removed 2/15 pacing wires removed 2/16 milrinone stopped, extubated 2/17 Zosyn for GNR bacteremia-> Morganella morganii 2/18 Arterial line, central line, vascath all removed and replaced. 2/19 Milrinone back on. Co-ox improved. ECHO Left ventricular ejection fraction, by estimation, is 50 to 55%. The left ventricle has low normal function. No regional WM abnormality. Right Ventricle: Ventricular septum is flattened in systole and diastole  suggesting RV pressure and volume overload. The right ventricular size is  severely enlarged. Right vetricular wall thickness was not well  visualized. Right ventricular systolic  function is severely reduced. There is moderately elevated pulmonary  artery systolic pressure 7/82 still high pressor needs. CRRT stopped. Having intermittent bouts of tachycardia 2/21 Hemodynamics better. Weaning Epi. Still on Norepi and inotropic support. Supported on BIPAP over night and tolerated well. On am rounds more lethargic w/ generalized pain everywhere. Co-ox > 70. Glucose poorly controlled. 2/22 Escalating pressor requirements (NE, Epi, Vaso). Continues on milrinone. Afib with RVR overnight, now with persistent AF (on amio, bival). Co-ox 73%. Hgb 7.3. WBC uptrending (25 from 20), Cr uptrending, CRRT restarted. Tolerating BiPAP. 2/23 Improving pressor needs. Stable Epi/Vaso, downtitrating NE. Milrinone 0.25. Persistent AF, rate controlled. Co-ox 65.7%. Ongoing CRRT. BiPAP QHS. 2/24 Continued decreasing pressor needs, stable epi/vaso, downtitrating Levo.  Milrinone 0.25.  Intermittent A-fib, in and out of NSR, rates 90s.  Co-ox 76.1%.  Ongoing CRRT, did not utilize BiPAP overnight. FEES with SLP.  Interim History / Subjective:  No significant events overnight Continued decreasing pressor needs, stable epi/vaso, downtitrating Levo Continues on milrinone 0.25 Intermittent A-fib, in and out of NSR, rates 90s Co-ox 76.1% Ongoing CRRT,  pulling ~200 mL/hr UF Did not utilize  BiPAP overnight. FEES with SLP.  Objective:  Blood pressure 106/68, pulse 99, temperature (!) 96.3 F (35.7 C), temperature source Axillary, resp. rate 15, height 4' 11.5" (1.511 m), weight 66.9 kg, SpO2 100 %. CVP:  [10 mmHg-39 mmHg] 10 mmHg      Intake/Output Summary (Last 24 hours) at 01/20/2022 0759 Last data filed at 01/20/2022 0700 Gross per 24 hour  Intake 3059.42 ml  Output 5131 ml  Net -2071.58 ml    Filed Weights   01/18/22 0500 01/19/22 0414 01/20/22 0339  Weight: 66.9 kg 66.9 kg 66.9 kg   Physical Examination: General: Acutely-on-chronically ill-appearing middle-aged woman in NAD. HEENT: [place]/AT, anicteric sclera, PERRL, moist mucous membranes. Neuro: Awake, oriented x 4. Responds to verbal stimuli. Following commands consistently. Moves all 4 extremities spontaneously. Strength 3/5 in all 4 extremities. CV: Irregularly irregular rhythm, no m/g/r. PULM: Breathing even and unlabored on 3LNC. Lung fields CTAB in upper fields, diminished at bilateral bases. GI: Soft, nontender, mildly distended. Normoactive bowel sounds. Extremities: Bilateral symmetric nonpitting 1+ LE edema, bilateral UE edema 2+; nonpitting with exception of adjacent to fistula site Skin: Warm/dry, no rashes; incision sites clean/dry/intact, no erythema or drainage.  Assessment & Plan:  Principal Problem:   Chest pain, Multivessel CAD Active Problems:   Hyperlipidemia associated with type 2 diabetes mellitus (HCC)   Anemia of chronic renal failure   Type 2 diabetes mellitus (HCC)   Obesity (BMI 30-39.9)   Pulmonary hypertension (HCC)   Chronic combined systolic and diastolic heart failure (HCC)   End stage renal disease on dialysis (HCC)   History of stroke   QT prolongation   SVT (supraventricular tachycardia) (HCC)   Coronary artery disease involving native coronary artery of native heart with unstable angina pectoris (HCC)   Hyponatremia   GERD (gastroesophageal reflux disease)   Septic shock  (HCC)   Gram-negative bacteremia   Hx of CABG   S/P TVR (tricuspid valve repair)   Cardiogenic shock (HCC)   Tachycardia   S/P CABG (coronary artery bypass graft)  CAD with TR, s/p CABG x 3 and TVR 2/10 Acute-on-chronic systolic/diastolic CHF - POD# 14 from CABG, TVR - TCTS following, appreciate assistance with management - Trend Co-ox (76.1%, keep in mind drawn from femoral CVC) - Continue milrinone - Goal euvolemia  Mixed cardiogenic and septic shock secondary to morganella morganii bacteremia Feel this is more a cardiogenic component at present, given afebrile status, improvement in WBC and ongoing antibiotic coverage. - Goal MAP > 65 - Decreasing pressor requirements as above; epi/vaso stable, Levophed downtitrating - Milrinone as above - Zosyn x 10-day course (end 2/26) - Trend WBC, LA - Follow-up finalized BCx  Afib, persistent; RVR resolved Afib with RVR noted 2/21PM with rates into 150s. - Intermittently back into NSR 2/24 AM - Continue amiodarone gtt - Cardiac monitoring - Monitor HR on Epi, currently rate controlled - Continue bivalirudin  Acute respiratory failure with hypoxia  Combined pre and post capillary PH  Was on Revatio and Opsumit.  - Continue supplemental O2 support - Wean O2 for sat > 90% - Pulmonary hygiene; IS/flutter as able; encourage mobility - Intermittent CXR - Hold Opsumit  ESRD on HD Thrombosed AVF - Nephrology, appreciate assistance with management - CRRT resumed 2/22, likely unable to tolerate iHD in the setting of labile blood pressures - Trend BMP - Replete electrolytes as indicated - Monitor I&Os - Avoid nephrotoxic agents as able - Ensure adequate renal perfusion -  Concern re: long-term HD access; prior thrombosed AVF site likely not amenable to intervention - Will need tunneled PermCath for long-term access  Acute metabolic encephalopathy ? ICU delirium, ? Sepsis. Improved 2/22AM. - Delirium precautions - Continue  supportive care  HLD DM2 w/ hyperglycemia  - Continue Levemir 20U twice daily - TF coverage - SSI (resistant scale)  Acute blood loss anemia Suspect secondary to surgery, frequent CRRT circuit changes, and chronic kidney disease. CBC stable  - Trend CBC - Transfuse for Hgb < 7.0 or hemodynamically significant bleeding  Hx PE - ?CTEPH Thrombocytopenia - suspect CRRT related HITT panel 0.075. SRA negative.  - Continue bivalirudin (for ease of keeping therapeutic) - Transition back to  PO agent once more clinically stable  Inadequate oral intake related to inability to eat At risk for malnutrition - Nutrition/SLP following, appreciate recommendations - Cortrak present, continue TF - FEES today, 2/24 with SLP  Best Practice: (right click and "Reselect all SmartList Selections" daily)   Diet/type: tubefeeds   DVT prophylaxis: other- bivalirudin GI prophylaxis: PPI Lines: Central line, Dialysis Catheter, Arterial Line, and yes and it is still needed  Foley:  N/A Code Status:  full code Last date of multidisciplinary goals of care discussion [Family updated 2/18]   Critical care time: 36 minutes   Lestine Mount, PA-C  Pulmonary & Critical Care 01/20/22 7:59 AM  Please see [email or web address] for pager details.  From 7A-7P if no response, please call [phone] After hours, please call ELink [phone]

## 2022-01-20 NOTE — Telephone Encounter (Signed)
01/20/22-Called patient to r/s initial visit w/ CPP. Unable to reach patient. [place].  Total time spent: 2 Minutes Vanetta Shawl, [facility]

## 2022-01-21 ENCOUNTER — Inpatient Hospital Stay (HOSPITAL_COMMUNITY): Payer: Medicare Other

## 2022-01-21 DIAGNOSIS — G9341 Metabolic encephalopathy: Secondary | ICD-10-CM | POA: Diagnosis not present

## 2022-01-21 DIAGNOSIS — J9601 Acute respiratory failure with hypoxia: Secondary | ICD-10-CM | POA: Diagnosis not present

## 2022-01-21 DIAGNOSIS — R570 Cardiogenic shock: Secondary | ICD-10-CM | POA: Diagnosis not present

## 2022-01-21 DIAGNOSIS — R7881 Bacteremia: Secondary | ICD-10-CM | POA: Diagnosis not present

## 2022-01-21 DIAGNOSIS — R072 Precordial pain: Secondary | ICD-10-CM | POA: Diagnosis not present

## 2022-01-21 DIAGNOSIS — I5042 Chronic combined systolic (congestive) and diastolic (congestive) heart failure: Secondary | ICD-10-CM | POA: Diagnosis not present

## 2022-01-21 DIAGNOSIS — N189 Chronic kidney disease, unspecified: Secondary | ICD-10-CM | POA: Diagnosis not present

## 2022-01-21 LAB — RENAL FUNCTION PANEL
Albumin: 2.4 g/dL — ABNORMAL LOW (ref 3.5–5.0)
Albumin: 2.4 g/dL — ABNORMAL LOW (ref 3.5–5.0)
Anion gap: 10 (ref 5–15)
Anion gap: 9 (ref 5–15)
BUN: 17 mg/dL (ref 8–23)
BUN: 16 mg/dL (ref 8–23)
CO2: 25 mmol/L (ref 22–32)
CO2: 26 mmol/L (ref 22–32)
Calcium: 9.7 mg/dL (ref 8.9–10.3)
Calcium: 9.8 mg/dL (ref 8.9–10.3)
Chloride: 100 mmol/L (ref 98–111)
Chloride: 103 mmol/L (ref 98–111)
Creatinine, Ser: 1.28 mg/dL — ABNORMAL HIGH (ref 0.44–1.00)
Creatinine, Ser: 1.2 mg/dL — ABNORMAL HIGH (ref 0.44–1.00)
GFR, Estimated: 46 mL/min — ABNORMAL LOW (ref >60)
GFR, Estimated: 50 mL/min — ABNORMAL LOW (ref >60)
Glucose, Bld: 187 mg/dL — ABNORMAL HIGH (ref 70–99)
Glucose, Bld: 79 mg/dL (ref 70–99)
Phosphorus: 2.4 mg/dL — ABNORMAL LOW (ref 2.5–4.6)
Phosphorus: 2.1 mg/dL — ABNORMAL LOW (ref 2.5–4.6)
Potassium: 4.7 mmol/L (ref 3.5–5.1)
Potassium: 4 mmol/L (ref 3.5–5.1)
Sodium: 135 mmol/L (ref 135–145)
Sodium: 138 mmol/L (ref 135–145)

## 2022-01-21 LAB — GLUCOSE, CAPILLARY
Glucose-Capillary: 175 mg/dL — ABNORMAL HIGH (ref 70–99)
Glucose-Capillary: 242 mg/dL — ABNORMAL HIGH (ref 70–99)
Glucose-Capillary: 228 mg/dL — ABNORMAL HIGH (ref 70–99)
Glucose-Capillary: 78 mg/dL (ref 70–99)

## 2022-01-21 LAB — CBC WITH DIFFERENTIAL/PLATELET
Abs Immature Granulocytes: 0.83 10*3/uL — ABNORMAL HIGH (ref 0.00–0.07)
Basophils Absolute: 0.1 10*3/uL (ref 0.0–0.1)
Basophils Relative: 1 %
Eosinophils Absolute: 0.4 10*3/uL (ref 0.0–0.5)
Eosinophils Relative: 2 %
HCT: 23 % — ABNORMAL LOW (ref 36.0–46.0)
Hemoglobin: 7 g/dL — ABNORMAL LOW (ref 12.0–15.0)
Immature Granulocytes: 4 %
Lymphocytes Relative: 8 %
Lymphs Abs: 1.6 10*3/uL (ref 0.7–4.0)
MCH: 30 pg (ref 26.0–34.0)
MCHC: 30.4 g/dL (ref 30.0–36.0)
MCV: 98.7 fL (ref 80.0–100.0)
Monocytes Absolute: 1.9 10*3/uL — ABNORMAL HIGH (ref 0.1–1.0)
Monocytes Relative: 10 %
Neutro Abs: 14.6 10*3/uL — ABNORMAL HIGH (ref 1.7–7.7)
Neutrophils Relative %: 75 %
Platelets: 219 10*3/uL (ref 150–400)
RBC: 2.33 MIL/uL — ABNORMAL LOW (ref 3.87–5.11)
RDW: 23.9 % — ABNORMAL HIGH (ref 11.5–15.5)
WBC: 19.5 10*3/uL — ABNORMAL HIGH (ref 4.0–10.5)
nRBC: 20.1 % — ABNORMAL HIGH (ref 0.0–0.2)

## 2022-01-21 LAB — CBC
HCT: 23.5 % — ABNORMAL LOW (ref 36.0–46.0)
Hemoglobin: 7.1 g/dL — ABNORMAL LOW (ref 12.0–15.0)
MCH: 29 pg (ref 26.0–34.0)
MCHC: 30.2 g/dL (ref 30.0–36.0)
MCV: 95.9 fL (ref 80.0–100.0)
Platelets: 190 10*3/uL (ref 150–400)
RBC: 2.45 MIL/uL — ABNORMAL LOW (ref 3.87–5.11)
RDW: 23.5 % — ABNORMAL HIGH (ref 11.5–15.5)
WBC: 18 10*3/uL — ABNORMAL HIGH (ref 4.0–10.5)
nRBC: 20.3 % — ABNORMAL HIGH (ref 0.0–0.2)

## 2022-01-21 LAB — COOXEMETRY PANEL
Carboxyhemoglobin: 1.5 % (ref 0.5–1.5)
Carboxyhemoglobin: 1.7 % — ABNORMAL HIGH (ref 0.5–1.5)
Methemoglobin: 0.7 % (ref 0.0–1.5)
Methemoglobin: <0.7 % (ref 0.0–1.5)
O2 Saturation: 41.5 %
O2 Saturation: 63 %
Total hemoglobin: 7.9 g/dL — ABNORMAL LOW (ref 12.0–16.0)
Total hemoglobin: <6.9 g/dL — CL (ref 12.0–16.0)

## 2022-01-21 LAB — PREPARE RBC (CROSSMATCH)

## 2022-01-21 LAB — MAGNESIUM: Magnesium: 2.5 mg/dL — ABNORMAL HIGH (ref 1.7–2.4)

## 2022-01-21 LAB — APTT: aPTT: 53 seconds — ABNORMAL HIGH (ref 24–36)

## 2022-01-21 MED ORDER — SODIUM CHLORIDE 0.9% IV SOLUTION: Freq: Once | INTRAVENOUS | Status: AC

## 2022-01-21 MED ORDER — DARBEPOETIN ALFA 100 MCG/0.5ML IJ SOSY
100.0000 ug | PREFILLED_SYRINGE | INTRAMUSCULAR | Status: DC
  Administered 2022-01-21 – 2022-01-28 (×2): 100 ug via SUBCUTANEOUS
  Filled 2022-01-21 (×3): qty 0.5

## 2022-01-21 MED ORDER — INSULIN ASPART 100 UNIT/ML IJ SOLN
8.0000 [IU] | INTRAMUSCULAR | Status: DC
  Administered 2022-01-21 – 2022-01-24 (×18): 8 [IU] via SUBCUTANEOUS

## 2022-01-21 NOTE — Progress Notes (Signed)
[facility] Progress Note  Name: Jody Nguyen MRN: [id number] DOB: 10-29-1956   Subjective: net neg 2.0 L yesterday.    Intake/Output Summary (Last 24 hours) at 01/21/2022 1447 Last data filed at 01/21/2022 1400 Gross per 24 hour  Intake 3682.65 ml  Output 5127 ml  Net -1444.35 ml     Vitals:  Vitals:   01/21/22 1315 01/21/22 1330 01/21/22 1400 01/21/22 1415  BP:   97/63   Pulse: 99 99 100 (!) 103  Resp: 13 13 12 16   Temp:      TempSrc:      SpO2: 100% 100% 100% 100%  Weight:      Height:         Physical Exam:  General: tired appearing, NAD, nasal O2 and cortrak Lungs: clear ant/ lat, nasal O2 Heart S1S2  Abdomen soft obese Extremities: mild LE , LUE edema, +2 RUE edema Neuro : awake, tired, responsive Access: no bruit RUE AVF; right fem temp HD line c/d/i  OP HD: MWF [facility] 3:15h   450/A1.5   75.5kg   2K/2Ca bath  P2   AVF  Hep  2500 - Hectoral 28mcg IV q HD - Sensipar 90mg  PO q HD - Mircera 120mcg IV q 2 weeks (ordered, not yet given. Last Mircera 100 on 12/07/21)    Assessment/Plan:   CAD s/p CABG x3, TR sp TVR:  per CT surgery   ESRD:  normally MWF schedule, last [facility] Thursday 2/09. CRRT started 2/11. Did not tolerate CRRT holiday, resumed 2/22. Continue CRRT.   Hypotension/ shock/ morganella bacteremia - remains on 2 pressors (vaso/ levo) and inotroped (epi/milrinone). D9 of Zosyn Leukocytosis-per pmd, on IV zosyn Volume: remains in shock. Continue 60 cc/hr UF as tolerated. Wt's are stable but unchanging, will check the scales may be broken   Anemia CKD: Hb down, getting prbc's today. Esa ordered but never given, will order Sq darbe 100ug weekly starting today 2/25.  Metabolic bone disease: resume home binders and sensipar when taking PO. Cont IV vdra.   Infiltrated AVF - AVF infiltrated on 2/7. Expert cannulator able to stick 2/09 and completed HD pre CABG. Post-op AVF is nonfunctional, VVS aware. Planning for [facility] when more stable.  Pulm HTN:   Followed at [facility], on Revatio and opsumit  T2DM-per primary service  Hx of MV replacement - in 2016  Kelly Splinter, MD 01/21/2022, 2:47 PM

## 2022-01-21 NOTE — Progress Notes (Signed)
[place] for bivalirudin Indication: history of pulmonary embolus 2021  Allergies  Allergen Reactions   Minoxidil Other (See Comments)    Pericardial effusion   Lipitor [Atorvastatin] Other (See Comments)    MYALGIAS > "pain in legs" Tolerates rosuvastatin   Morphine And Related Itching   Ace Inhibitors Other (See Comments)    REACTION: "not sure...think it made me drowsy all the time"    Patient Measurements: Height: 4' 11.5" (151.1 cm) Weight: 66.9 kg (147 lb 7.8 oz) (unable to weigh) IBW/kg (Calculated) : 44.35  Vital Signs: Temp: 97.6 F (36.4 C) (02/25 1040) Temp Source: Oral (02/25 1040) BP: 97/63 (02/25 1400) Pulse Rate: 103 (02/25 1415)  Labs: Recent Labs    01/19/22 0357 01/19/22 1554 01/20/22 0444 01/20/22 1607 01/20/22 1641 01/20/22 1944 01/21/22 0416 01/21/22 0917  HGB 7.1*   < > 7.2*  --    < > 7.8* 7.0* 7.1*  HCT 23.9*   < > 23.8*  --    < > 23.0* 23.0* 23.5*  PLT 188  --  208  --   --   --  219 190  APTT 56*  --  51*  --   --   --  53*  --   CREATININE 1.88*   < > 1.20* 1.23*  --   --  1.28*  --    < > = values in this interval not displayed.     Estimated Creatinine Clearance: 36.4 mL/min (A) (by C-G formula based on SCr of 1.28 mg/dL (H)).   Assessment: 66 yo female on chronic apixaban for hx of PE 2021  Apixaban has been held for surgery.  Pharmacy asked to resume anticoagulation 2/15 with bivalirudin.  HIT antibody and SRA both negative.  Platelet count now recovered 50>208.  aPTT this morning therapeutic at 53 sec on bivalirudin 0.0135 mg/kg/hr. Hemoglobin low stable 7-8  No bleeding or complications currently noted.  Goal of Therapy:  aPTT goal ~50-70sec - target lower end given low platelet count/risk of post-operative bleeding Monitor platelets by anticoagulation protocol: Yes   Plan:  -Continue bival at 0.0135 mg/kg/hr -aPTT and CBC daily while on bival -Monitor for s/x of  bleeding  Marguerite Olea, BCCP Clinical Pharmacist  01/21/2022 2:52 PM   [facility] pharmacy phone numbers are listed on [email or web address]

## 2022-01-21 NOTE — Progress Notes (Signed)
CT surgery PM rounds  Patient's CVVH circuit clotted off and had to be reprimed No progress today on norepinephrine wean after that event Breathing comfortably 100% O2 sat with stable blood pressure  Blood pressure 92/60, pulse 100, temperature 99.3 F (37.4 C), temperature source Oral, resp. rate 16, height 4' 11.5" (1.511 m), weight 66.9 kg, SpO2 100 %.

## 2022-01-21 NOTE — Progress Notes (Signed)
NAME:  Jody Nguyen, MRN:  [id number], DOB:  1956-05-29, LOS: 41 ADMISSION DATE:  01/17/2022, CONSULTATION DATE:  01/08/2022 REFERRING MD:  Marlowe Sax - TRH, CHIEF COMPLAINT:  SOB   History of Present Illness:  66 year old woman who presented to [facility] ED 2/4 with CP radiating to her arm. PMHx significant for  ESRD (on HD), non-obstructive CAD, suspected pulmonary hypertension, PE (on Eliquis). Recent ED presentation 2/1 for similar symptoms (troponin 100s). F/u with Cardiology as an outpatient 2/2 and CP was felt to be related to volume shift (occurring during dialysis).   On ED presentation 2/4, patient reported episodic chest pain, non-exertional and intermittent x 1 week with associated nausea and SOB. Non-specific EKG changes. Cardiology consulted, STEMI ruled out. Troponin elevated at 200. Placed on [place] for SpO2 80s. Admitted to [facility] for observation with concern for PE and started on heparin gtt. VQ with low probability for PE. Echo 2/19 demonstrated severe RV/RA enlargement, severely reduced RV function and septal bowing; felt to be consistent with chronic process such as severe pulmonary HTN.    PCCM has been consulted in this setting.  Pertinent Medical History:  ESRD PE Pulmonary HTN  CAD CVA HLD HTN S/p MV repair  Pericardial effusion r/t minoxidil  Diastolic HF DM2  Significant Hospital Events: Including procedures, antibiotic start and stop dates in addition to other pertinent events   2/4 ED presentation with recurring chest pain -- STEMI ruled out. Stable, mildly hypoxic and placed on [place] 2/5 Admit to [facility] for obvs. Concern for PE, ECHO performed by cardiology reportedly shows RV failure, but felt more consistent with chronic process like severe pulmHTN. Awaiting VQ scan. Pulm consulted  2/7 Cardiac cath. 3 vessel dz. CABG rec 2/10 CABG, TVR 2/11 Continued vasoplegic shock with very high vasopressor requirements  2/12 Given methylene blue x 2 2/13 Chest tube removed 2/14 JP  removed 2/15 pacing wires removed 2/16 milrinone stopped, extubated 2/17 Zosyn for GNR bacteremia-> Morganella morganii 2/18 Arterial line, central line, vascath all removed and replaced. 2/19 Milrinone back on. Co-ox improved. ECHO Left ventricular ejection fraction, by estimation, is 50 to 55%. The left ventricle has low normal function. No regional WM abnormality. Right Ventricle: Ventricular septum is flattened in systole and diastole  suggesting RV pressure and volume overload. The right ventricular size is  severely enlarged. Right vetricular wall thickness was not well  visualized. Right ventricular systolic  function is severely reduced. There is moderately elevated pulmonary  artery systolic pressure 8/25 still high pressor needs. CRRT stopped. Having intermittent bouts of tachycardia 2/21 Hemodynamics better. Weaning Epi. Still on Norepi and inotropic support. Supported on BIPAP over night and tolerated well. On am rounds more lethargic w/ generalized pain everywhere. Co-ox > 70. Glucose poorly controlled. 2/22 Escalating pressor requirements (NE, Epi, Vaso). Continues on milrinone. Afib with RVR overnight, now with persistent AF (on amio, bival). Co-ox 73%. Hgb 7.3. WBC uptrending (25 from 20), Cr uptrending, CRRT restarted. Tolerating BiPAP. 2/23 Improving pressor needs. Stable Epi/Vaso, downtitrating NE. Milrinone 0.25. Persistent AF, rate controlled. Co-ox 65.7%. Ongoing CRRT. BiPAP QHS. 2/24 Continued decreasing pressor needs, stable epi/vaso, downtitrating Levo.  Milrinone 0.25.  Intermittent A-fib, in and out of NSR, rates 90s.  Co-ox 76.1%.  Ongoing CRRT, did not utilize BiPAP overnight. FEES with SLP. 2/25 confused.  Pressor requirements about the same.  Got 1 unit of blood for hemoglobin of 7, Co. ox dropped  Interim History / Subjective:  Confused today.  Objective:  Blood pressure 101/60,  pulse (Abnormal) 101, temperature 98.5 F (36.9 C), temperature source Axillary, resp.  rate 19, height 4' 11.5" (1.511 m), weight 66.9 kg, SpO2 100 %. CVP:  [12 mmHg-49 mmHg] 17 mmHg      Intake/Output Summary (Last 24 hours) at 01/21/2022 0831 Last data filed at 01/21/2022 1610 Gross per 24 hour  Intake 4016.27 ml  Output 5522 ml  Net -1505.73 ml   Filed Weights   01/19/22 0414 01/20/22 0339 01/21/22 0500  Weight: 66.9 kg 66.9 kg 66.9 kg   Physical Examination: General: Critically ill 66 year old female, more encephalopathic today HEENT normocephalic atraumatic no jugular venous distention Pulmonary: Tachypneic, remains on supplemental oxygen at 4 L/min.  Diminished bilaterally Cardiac tachycardic rhythm no murmur rub or gallop Abdomen soft Extremities increased diffuse edema with dependent component Neuro awake, diffusely weak, confused GU anuric  Assessment & Plan:  Principal Problem:   Chest pain, Multivessel CAD Active Problems:   Coronary artery disease involving native coronary artery of native heart with unstable angina pectoris ([place])   Pulmonary hypertension (HCC)   Hx of CABG   S/P TVR (tricuspid valve repair)   Hyperlipidemia associated with type 2 diabetes mellitus (HCC)   Anemia of chronic renal failure   Type 2 diabetes mellitus (HCC)   Obesity (BMI 30-39.9)   Chronic combined systolic and diastolic heart failure (HCC)   Atrial fibrillation (HCC)   End stage renal disease on dialysis (HCC)   History of stroke   QT prolongation   SVT (supraventricular tachycardia) (HCC)   Hyponatremia   GERD (gastroesophageal reflux disease)   Septic shock (HCC)   Gram-negative bacteremia   Cardiogenic shock (HCC)   Tachycardia   S/P CABG (coronary artery bypass graft)   Metabolic encephalopathy  CAD with TR, s/p CABG x 3 and TVR 2/10 Acute-on-chronic systolic/diastolic CHF Plan Postop day #15 Trending femoral Co-ox  Milrinone Trying to pull fluid   Mixed cardiogenic and septic shock secondary to morganella morganii bacteremia Feel this is more a  cardiogenic component at present, given afebrile status, improvement in WBC and ongoing antibiotic coverage. Plan Continuing to titrate norepinephrine Will decrease vasopressin to 0.03 Continue epinephrine Continuing milrinone IV Zosyn to complete on 2/26   Afib, persistent; RVR resolved Afib with RVR noted 2/21PM with rates into 150s. - Intermittently back into NSR 2/24 AM Plan Continuing amiodarone Telemetry Bivalirudin for anticoagulation  Acute respiratory failure with hypoxia  Combined pre and post capillary PH  Was on Revatio and Opsumit.  Plan Continue supplemental oxygen Pulse oximetry Pulmonary hygiene as tolerated Holding Opsumit   ESRD on HD Thrombosed AVF - Nephrology, appreciate assistance with management - CRRT resumed 2/22, likely unable to tolerate iHD in the setting of labile blood pressures Plan Continue CRRT Eventually needs tunneled PermCath, if we can get her to that point Ensure adequate renal perfusion   Acute metabolic encephalopathy ? ICU delirium, ? Sepsis.  Plan Supportive care Correct metabolic derangements Delirium precautions   HLD DM2 w/ hyperglycemia  Still poor control Plan Continue Levemir 20 units twice a day Continue sliding scale resistant Increased tube feed coverage to 8 units every 4  Acute blood loss anemia Suspect secondary to surgery, frequent CRRT circuit changes, and chronic kidney disease. CBC stable getting blood this morning Plan Continue to trend CBC Transfusion trigger for less than 7 hemoglobin   Hx PE - ?CTEPH Thrombocytopenia - suspect CRRT related HITT panel 0.075. SRA negative.  Plan Continue bivalirudin for ease of therapeutic management Eventually back on p.o.  therapy   Inadequate oral intake related to inability to eat At risk for malnutrition Plan Continue tube feeds via core track Follow-up with SLP, fees when respiratory and hemodynamic status will allow   Best Practice: (right  click and "Reselect all SmartList Selections" daily)   Diet/type: tubefeeds   DVT prophylaxis: other- bivalirudin GI prophylaxis: PPI Lines: Central line, Dialysis Catheter, Arterial Line, and yes and it is still needed  Foley:  N/A Code Status:  full code Last date of multidisciplinary goals of care discussion [Family updated 2/18]   Critical care time: 46 minutes   Clementeen Graham, NP [facility] 01/21/22 8:31 AM

## 2022-01-21 NOTE — Progress Notes (Signed)
15 Days Post-Op Procedure(s) (LRB): CORONARY ARTERY BYPASS GRAFTING (CABG) TIMES THREE ON CARDIOPULMONARY BYPASS. LIMA TO LAD, SVG TO PD, SVG TO DIAG (N/A) TRICUSPID VALVE REPAIR USING AN 36MM EDWARDS MC3 TRICUSPID ANNULOPLASTY RING (N/A) TRANSESOPHAGEAL ECHOCARDIOGRAM (TEE) (N/A) APPLICATION OF CELL SAVER (N/A) ENDOVEIN HARVEST OF GREATER SAPHENOUS VEIN (Right) REPAIR OF PATENT FORAMEN OVALE (N/A) Subjective: Nods her head in response to question Receiving 2nd unit PRBC for Hb 7 Norepi slowly weaning Incisions all clean, dry  Objective: Vital signs in last 24 hours: Temp:  [97.5 F (36.4 C)-99.2 F (37.3 C)] 97.6 F (36.4 C) (02/25 1040) Pulse Rate:  [86-105] 100 (02/25 1200) Cardiac Rhythm: Normal sinus rhythm;Sinus tachycardia (02/25 0800) Resp:  [11-29] 15 (02/25 1200) BP: (86-101)/(50-73) 97/67 (02/25 1200) SpO2:  [87 %-100 %] 100 % (02/25 1200) Weight:  [66.9 kg] 66.9 kg (02/25 0500)  Hemodynamic parameters for last 24 hours: CVP:  [13 mmHg-49 mmHg] 17 mmHg  Intake/Output from previous day: 02/24 0701 - 02/25 0700 In: 3852.3 [I.V.:1442.3; Blood:20; NG/GT:2190; IV Piggyback:200] Out: 8088 [Stool:375] Intake/Output this shift: Total I/O In: 718.6 [I.V.:222; Blood:266.7; NG/GT:230] Out: 513 [Other:463; Stool:50]    Lab Results: Recent Labs    01/21/22 0416 01/21/22 0917  WBC 19.5* 18.0*  HGB 7.0* 7.1*  HCT 23.0* 23.5*  PLT 219 190   BMET:  Recent Labs    01/20/22 1607 01/20/22 1641 01/20/22 1944 01/21/22 0416  NA 136   < > 136 135  K 4.5   < > 4.5 4.7  CL 103  --   --  100  CO2 21*  --   --  25  GLUCOSE 176*  --   --  187*  BUN 17  --   --  17  CREATININE 1.23*  --   --  1.28*  CALCIUM 9.4  --   --  9.7   < > = values in this interval not displayed.    PT/INR: No results for input(s): LABPROT, INR in the last 72 hours. ABG    Component Value Date/Time   PHART 7.425 01/20/2022 1944   HCO3 23.6 01/20/2022 1944   TCO2 25 01/20/2022 1944    ACIDBASEDEF 1.0 01/20/2022 1944   O2SAT 63 01/21/2022 0917   CBG (last 3)  Recent Labs    01/20/22 2349 01/21/22 0412 01/21/22 0743  GLUCAP 141* 175* 242*    Assessment/Plan: S/P Procedure(s) (LRB): CORONARY ARTERY BYPASS GRAFTING (CABG) TIMES THREE ON CARDIOPULMONARY BYPASS. LIMA TO LAD, SVG TO PD, SVG TO DIAG (N/A) TRICUSPID VALVE REPAIR USING AN 36MM EDWARDS MC3 TRICUSPID ANNULOPLASTY RING (N/A) TRANSESOPHAGEAL ECHOCARDIOGRAM (TEE) (N/A) APPLICATION OF CELL SAVER (N/A) ENDOVEIN HARVEST OF GREATER SAPHENOUS VEIN (Right) REPAIR OF PATENT FORAMEN OVALE (N/A) No new problems Cont current care   LOS: 20 days    Jody Nguyen 01/21/2022

## 2022-01-22 DIAGNOSIS — N186 End stage renal disease: Secondary | ICD-10-CM | POA: Diagnosis not present

## 2022-01-22 DIAGNOSIS — I480 Paroxysmal atrial fibrillation: Secondary | ICD-10-CM

## 2022-01-22 DIAGNOSIS — R072 Precordial pain: Secondary | ICD-10-CM | POA: Diagnosis not present

## 2022-01-22 DIAGNOSIS — J9601 Acute respiratory failure with hypoxia: Secondary | ICD-10-CM | POA: Diagnosis not present

## 2022-01-22 LAB — BPAM RBC
Blood Product Expiration Date: 202303022359
Blood Product Expiration Date: 202303242359
ISSUE DATE / TIME: 202302250641
ISSUE DATE / TIME: 202302251034
Unit Type and Rh: 5100
Unit Type and Rh: 9500

## 2022-01-22 LAB — CBC WITH DIFFERENTIAL/PLATELET
Abs Immature Granulocytes: 0.45 10*3/uL — ABNORMAL HIGH (ref 0.00–0.07)
Basophils Absolute: 0.2 10*3/uL — ABNORMAL HIGH (ref 0.0–0.1)
Basophils Relative: 1 %
Eosinophils Absolute: 0.6 10*3/uL — ABNORMAL HIGH (ref 0.0–0.5)
Eosinophils Relative: 3 %
HCT: 25.3 % — ABNORMAL LOW (ref 36.0–46.0)
Hemoglobin: 8 g/dL — ABNORMAL LOW (ref 12.0–15.0)
Immature Granulocytes: 2 %
Lymphocytes Relative: 7 %
Lymphs Abs: 1.4 10*3/uL (ref 0.7–4.0)
MCH: 30.1 pg (ref 26.0–34.0)
MCHC: 31.6 g/dL (ref 30.0–36.0)
MCV: 95.1 fL (ref 80.0–100.0)
Monocytes Absolute: 1.6 10*3/uL — ABNORMAL HIGH (ref 0.1–1.0)
Monocytes Relative: 8 %
Neutro Abs: 16.7 10*3/uL — ABNORMAL HIGH (ref 1.7–7.7)
Neutrophils Relative %: 79 %
Platelets: 187 10*3/uL (ref 150–400)
RBC: 2.66 MIL/uL — ABNORMAL LOW (ref 3.87–5.11)
RDW: 24.2 % — ABNORMAL HIGH (ref 11.5–15.5)
WBC: 20.8 10*3/uL — ABNORMAL HIGH (ref 4.0–10.5)
nRBC: 17.1 % — ABNORMAL HIGH (ref 0.0–0.2)

## 2022-01-22 LAB — RENAL FUNCTION PANEL
Albumin: 2.3 g/dL — ABNORMAL LOW (ref 3.5–5.0)
Albumin: 2.2 g/dL — ABNORMAL LOW (ref 3.5–5.0)
Anion gap: 9 (ref 5–15)
Anion gap: 12 (ref 5–15)
BUN: 16 mg/dL (ref 8–23)
BUN: 16 mg/dL (ref 8–23)
CO2: 26 mmol/L (ref 22–32)
CO2: 23 mmol/L (ref 22–32)
Calcium: 9.4 mg/dL (ref 8.9–10.3)
Calcium: 9.4 mg/dL (ref 8.9–10.3)
Chloride: 101 mmol/L (ref 98–111)
Chloride: 98 mmol/L (ref 98–111)
Creatinine, Ser: 1.23 mg/dL — ABNORMAL HIGH (ref 0.44–1.00)
Creatinine, Ser: 1.25 mg/dL — ABNORMAL HIGH (ref 0.44–1.00)
GFR, Estimated: 48 mL/min — ABNORMAL LOW (ref >60)
GFR, Estimated: 48 mL/min — ABNORMAL LOW (ref >60)
Glucose, Bld: 207 mg/dL — ABNORMAL HIGH (ref 70–99)
Glucose, Bld: 243 mg/dL — ABNORMAL HIGH (ref 70–99)
Phosphorus: 2.3 mg/dL — ABNORMAL LOW (ref 2.5–4.6)
Phosphorus: 1.8 mg/dL — ABNORMAL LOW (ref 2.5–4.6)
Potassium: 4.3 mmol/L (ref 3.5–5.1)
Potassium: 4.4 mmol/L (ref 3.5–5.1)
Sodium: 136 mmol/L (ref 135–145)
Sodium: 133 mmol/L — ABNORMAL LOW (ref 135–145)

## 2022-01-22 LAB — TYPE AND SCREEN
ABO/RH(D): O POS
Antibody Screen: NEGATIVE
Unit division: 0
Unit division: 0

## 2022-01-22 LAB — GLUCOSE, CAPILLARY
Glucose-Capillary: 115 mg/dL — ABNORMAL HIGH (ref 70–99)
Glucose-Capillary: 149 mg/dL — ABNORMAL HIGH (ref 70–99)
Glucose-Capillary: 189 mg/dL — ABNORMAL HIGH (ref 70–99)
Glucose-Capillary: 135 mg/dL — ABNORMAL HIGH (ref 70–99)
Glucose-Capillary: 168 mg/dL — ABNORMAL HIGH (ref 70–99)

## 2022-01-22 LAB — COOXEMETRY PANEL
Carboxyhemoglobin: 1 % (ref 0.5–1.5)
Methemoglobin: 3.3 % — ABNORMAL HIGH (ref 0.0–1.5)
O2 Saturation: 96.2 %
Total hemoglobin: 8.3 g/dL — ABNORMAL LOW (ref 12.0–16.0)

## 2022-01-22 LAB — APTT: aPTT: 52 seconds — ABNORMAL HIGH (ref 24–36)

## 2022-01-22 LAB — MAGNESIUM: Magnesium: 2.5 mg/dL — ABNORMAL HIGH (ref 1.7–2.4)

## 2022-01-22 MED ORDER — SODIUM PHOSPHATES 45 MMOLE/15ML IV SOLN
30.0000 mmol | Freq: Once | INTRAVENOUS | Status: AC
  Administered 2022-01-22: 30 mmol via INTRAVENOUS
  Filled 2022-01-22: qty 10

## 2022-01-22 NOTE — Progress Notes (Signed)
[facility] Progress Note  Name: Jody Nguyen MRN: [id number] DOB: 01/12/1956   Subjective: net neg 2.0 L yesterday.    Intake/Output Summary (Last 24 hours) at 01/22/2022 1639 Last data filed at 01/22/2022 1600 Gross per 24 hour  Intake 2599.84 ml  Output 3162 ml  Net -562.16 ml     Vitals:  Vitals:   01/22/22 1300 01/22/22 1400 01/22/22 1500 01/22/22 1600  BP: (!) 86/44 (!) 98/49  (!) 101/59  Pulse: (!) 105 (!) 106 (!) 105 (!) 107  Resp: (!) 24 18 20 17   Temp:    98.6 F (37 C)  TempSrc:    Oral  SpO2: 99% 100% 100% 98%  Weight:      Height:         Physical Exam:  General: tired appearing, NAD, nasal O2 and cortrak Lungs: clear ant/ lat, nasal O2 Heart S1S2  Abdomen soft obese Extremities: mild LE , LUE edema, +2 RUE edema Neuro : awake, responds Access: no bruit RUE AVF; right fem temp HD line c/d/i  OP HD: [facility] 3:15h   450/A1.5   75.5kg   2K/2Ca bath  P2   AVF  Hep  2500 - Hectoral 23mcg IV q HD - Sensipar 90mg  PO q HD - Mircera 154mcg IV q 2 weeks (ordered, not yet given. Last Mircera 100 on 12/07/21)    Assessment/Plan:   CAD s/p CABG x3, TR sp TVR:  per CT surgery   ESRD:  normally MWF schedule, last [facility] Thursday 2/09. CRRT started 2/11. Did not tolerate CRRT holiday due to ^wob/ hypoxia. Continue CRRT.   Shock - cardiogenic and septic. Remains on 2 pressors (vaso/ levo) and inotroped (epi/milrinone). Morganella bacteremia -  D10 of Zosyn. BCx's Volume: remains in shock. Continue 60 cc/hr UF as tolerated. Focal RUE edema, minimal other edema.   Anemia CKD: Hb down, getting prbc's today. Sq darbe 100ug weekly on Saturday. 1st 2/25.  Metabolic bone disease: resume home binders and sensipar when taking PO. Cont IV vdra.   Infiltrated AVF - AVF infiltrated on 2/7. Post-op AVF is nonfunctional, VVS aware. Planning for [facility] when more stable.  Pulm HTN:  Followed at [facility], on Revatio and opsumit  T2DM-per primary service  Hx of MV  replacement - in 2016  Kelly Splinter, MD 01/22/2022, 4:39 PM

## 2022-01-22 NOTE — Progress Notes (Signed)
16 Days Post-Op Procedure(s) (LRB): CORONARY ARTERY BYPASS GRAFTING (CABG) TIMES THREE ON CARDIOPULMONARY BYPASS. LIMA TO LAD, SVG TO PD, SVG TO DIAG (N/A) TRICUSPID VALVE REPAIR USING AN 36MM EDWARDS MC3 TRICUSPID ANNULOPLASTY RING (N/A) TRANSESOPHAGEAL ECHOCARDIOGRAM (TEE) (N/A) APPLICATION OF CELL SAVER (N/A) ENDOVEIN HARVEST OF GREATER SAPHENOUS VEIN (Right) REPAIR OF PATENT FORAMEN OVALE (N/A) Subjective: Patient more interactive and alert today Asking for food- will get swallow evaluation before placing diet order Inotropes at low level-Nor epi 6 mcg, epi 3 mcg, vaso 0.03 units Continues on bivalirudin for anticoagulation for controlled A-fib Objective: Vital signs in last 24 hours: Temp:  [97.4 F (36.3 C)-99.3 F (37.4 C)] 97.5 F (36.4 C) (02/26 0800) Pulse Rate:  [92-105] 100 (02/26 1000) Cardiac Rhythm: Normal sinus rhythm (02/26 0800) Resp:  [12-22] 21 (02/26 1000) BP: (82-116)/(50-79) 88/55 (02/26 1000) SpO2:  [98 %-100 %] 100 % (02/26 1000) Weight:  [76.8 kg] 76.8 kg (02/26 0700)  Hemodynamic parameters for last 24 hours: CVP:  [11 mmHg-22 mmHg] 11 mmHg  Intake/Output from previous day: 02/25 0701 - 02/26 0700 In: 2930 [I.V.:1143.2; Blood:496.7; NG/GT:1140; IV Piggyback:150.1] Out: 2929 [Stool:355] Intake/Output this shift: Total I/O In: 550.3 [I.V.:320.3; NG/GT:180; IV Piggyback:50] Out: 381 [Other:231; Stool:150]  Sternal incision clean and dry  Lab Results: Recent Labs    01/21/22 0917 01/22/22 0442  WBC 18.0* 20.8*  HGB 7.1* 8.0*  HCT 23.5* 25.3*  PLT 190 187   BMET:  Recent Labs    01/21/22 1610 01/22/22 0444  NA 138 136  K 4.0 4.3  CL 103 101  CO2 26 26  GLUCOSE 79 207*  BUN 16 16  CREATININE 1.20* 1.23*  CALCIUM 9.8 9.4    PT/INR: No results for input(s): LABPROT, INR in the last 72 hours. ABG    Component Value Date/Time   PHART 7.425 01/20/2022 1944   HCO3 23.6 01/20/2022 1944   TCO2 25 01/20/2022 1944   ACIDBASEDEF 1.0  01/20/2022 1944   O2SAT 96.2 01/22/2022 0442   CBG (last 3)  Recent Labs    01/21/22 2348 01/22/22 0441 01/22/22 0746  GLUCAP 115* 189* 149*    Assessment/Plan: S/P Procedure(s) (LRB): CORONARY ARTERY BYPASS GRAFTING (CABG) TIMES THREE ON CARDIOPULMONARY BYPASS. LIMA TO LAD, SVG TO PD, SVG TO DIAG (N/A) TRICUSPID VALVE REPAIR USING AN 36MM EDWARDS MC3 TRICUSPID ANNULOPLASTY RING (N/A) TRANSESOPHAGEAL ECHOCARDIOGRAM (TEE) (N/A) APPLICATION OF CELL SAVER (N/A) ENDOVEIN HARVEST OF GREATER SAPHENOUS VEIN (Right) REPAIR OF PATENT FORAMEN OVALE (N/A) Mental status improved today White count slightly increased but no fever Patient asking for food so we will request SLT swallow eval   LOS: 21 days    Dahlia Byes 01/22/2022

## 2022-01-22 NOTE — Progress Notes (Signed)
CT surgery PM rounds  Patient tired but still responsive and alert Norepinephrine weaned downto 2 mcg Controlled A-fib rhythm Swallow study pending, patient tolerated small amounts of ice chips today Blood pressure (!) 101/59, pulse (!) 106, temperature 98.6 F (37 C), temperature source Oral, resp. rate 19, height 4' 11.5" (1.511 m), weight (S) 76.8 kg, SpO2 100 %.

## 2022-01-22 NOTE — Progress Notes (Signed)
[place] for bivalirudin Indication: history of pulmonary embolus 2021  Allergies  Allergen Reactions   Minoxidil Other (See Comments)    Pericardial effusion   Lipitor [Atorvastatin] Other (See Comments)    MYALGIAS > "pain in legs" Tolerates rosuvastatin   Morphine And Related Itching   Ace Inhibitors Other (See Comments)    REACTION: "not sure...think it made me drowsy all the time"    Patient Measurements: Height: 4' 11.5" (151.1 cm) Weight: (S) 76.8 kg (169 lb 5 oz) IBW/kg (Calculated) : 44.35  Vital Signs: Temp: 98.3 F (36.8 C) (02/26 1200) Temp Source: Oral (02/26 1200) BP: 98/49 (02/26 1400) Pulse Rate: 106 (02/26 1400)  Labs: Recent Labs    01/20/22 0444 01/20/22 1607 01/21/22 0416 01/21/22 0917 01/21/22 1610 01/22/22 0442 01/22/22 0444  HGB 7.2*   < > 7.0* 7.1*  --  8.0*  --   HCT 23.8*   < > 23.0* 23.5*  --  25.3*  --   PLT 208  --  219 190  --  187  --   APTT 51*  --  53*  --   --  52*  --   CREATININE 1.20*   < > 1.28*  --  1.20*  --  1.23*   < > = values in this interval not displayed.     Estimated Creatinine Clearance: 40.8 mL/min (A) (by C-G formula based on SCr of 1.23 mg/dL (H)).   Assessment: 66 yo female on chronic apixaban for hx of PE 2021  Apixaban has been held for surgery.  Pharmacy asked to resume anticoagulation 2/15 with bivalirudin.  HIT antibody and SRA both negative.  Platelet count now recovered 50>208.  aPTT this morning therapeutic at 52 sec on bivalirudin 0.0135 mg/kg/hr. Hemoglobin low stable 7-8  No bleeding or complications currently noted.  Goal of Therapy:  aPTT goal ~50-70sec - target lower end given low platelet count/risk of post-operative bleeding Monitor platelets by anticoagulation protocol: Yes   Plan:  -Continue bival at 0.0135 mg/kg/hr -aPTT and CBC daily while on bival -Monitor for s/x of bleeding  Marguerite Olea, BCCP Clinical Pharmacist   01/22/2022 2:56 PM   [facility] pharmacy phone numbers are listed on [email or web address]

## 2022-01-22 NOTE — Progress Notes (Signed)
NAME:  Jody Nguyen, MRN:  [id number], DOB:  [birth year], LOS: 33 ADMISSION DATE:  01/10/2022, CONSULTATION DATE:  01/01/2022 REFERRING MD:  Marlowe Sax - TRH, CHIEF COMPLAINT:  SOB   History of Present Illness:  66 year old woman who presented to [facility] ED 2/4 with CP radiating to her arm. PMHx significant for  ESRD (on HD), non-obstructive CAD, suspected pulmonary hypertension, PE (on Eliquis). Recent ED presentation 2/1 for similar symptoms (troponin 100s). F/u with Cardiology as an outpatient 2/2 and CP was felt to be related to volume shift (occurring during dialysis).   On ED presentation 2/4, patient reported episodic chest pain, non-exertional and intermittent x 1 week with associated nausea and SOB. Non-specific EKG changes. Cardiology consulted, STEMI ruled out. Troponin elevated at 200. Placed on [place] for SpO2 80s. Admitted to [facility] for observation with concern for PE and started on heparin gtt. VQ with low probability for PE. Echo 2/19 demonstrated severe RV/RA enlargement, severely reduced RV function and septal bowing; felt to be consistent with chronic process such as severe pulmonary HTN.    PCCM has been consulted in this setting.  Pertinent Medical History:  ESRD PE Pulmonary HTN  CAD CVA HLD HTN S/p MV repair  Pericardial effusion r/t minoxidil  Diastolic HF DM2  Significant Hospital Events: Including procedures, antibiotic start and stop dates in addition to other pertinent events   2/4 ED presentation with recurring chest pain -- STEMI ruled out. Stable, mildly hypoxic and placed on [place] 2/5 Admit to [facility] for obvs. Concern for PE, ECHO performed by cardiology reportedly shows RV failure, but felt more consistent with chronic process like severe pulmHTN. Awaiting VQ scan. Pulm consulted  2/7 Cardiac cath. 3 vessel dz. CABG rec 2/10 CABG, TVR 2/11 Continued vasoplegic shock with very high vasopressor requirements  2/12 Given methylene blue x 2 2/13 Chest tube removed 2/14 JP  removed 2/15 pacing wires removed 2/16 milrinone stopped, extubated 2/17 Zosyn for GNR bacteremia-> Morganella morganii 2/18 Arterial line, central line, vascath all removed and replaced. 2/19 Milrinone back on. Co-ox improved. ECHO Left ventricular ejection fraction, by estimation, is 50 to 55%. The left ventricle has low normal function. No regional WM abnormality. Right Ventricle: Ventricular septum is flattened in systole and diastole  suggesting RV pressure and volume overload. The right ventricular size is  severely enlarged. Right vetricular wall thickness was not well  visualized. Right ventricular systolic  function is severely reduced. There is moderately elevated pulmonary  artery systolic pressure 8/45 still high pressor needs. CRRT stopped. Having intermittent bouts of tachycardia 2/21 Hemodynamics better. Weaning Epi. Still on Norepi and inotropic support. Supported on BIPAP over night and tolerated well. On am rounds more lethargic w/ generalized pain everywhere. Co-ox > 70. Glucose poorly controlled. 2/22 Escalating pressor requirements (NE, Epi, Vaso). Continues on milrinone. Afib with RVR overnight, now with persistent AF (on amio, bival). Co-ox 73%. Hgb 7.3. WBC uptrending (25 from 20), Cr uptrending, CRRT restarted. Tolerating BiPAP. 2/23 Improving pressor needs. Stable Epi/Vaso, downtitrating NE. Milrinone 0.25. Persistent AF, rate controlled. Co-ox 65.7%. Ongoing CRRT. BiPAP QHS. 2/24 Continued decreasing pressor needs, stable epi/vaso, downtitrating Levo.  Milrinone 0.25.  Intermittent A-fib, in and out of NSR, rates 90s.  Co-ox 76.1%.  Ongoing CRRT, did not utilize BiPAP overnight. FEES with SLP. 2/25 confused.  Pressor requirements down some.  Got 1 unit of blood for hemoglobin of 7, Co. ox dropped. Vasopressin decreased to 0.03 units.   Interim History / Subjective:  Looks a little  better today pressor requirements down a little  Objective:  Blood pressure 96/65, pulse 96,  temperature (Abnormal) 97.4 F (36.3 C), temperature source Oral, resp. rate 16, height 4' 11.5" (1.511 m), weight (Significant) 76.8 kg, SpO2 99 %. CVP:  [12 mmHg-22 mmHg] 12 mmHg      Intake/Output Summary (Last 24 hours) at 01/22/2022 0719 Last data filed at 01/22/2022 0600 Gross per 24 hour  Intake 2929.98 ml  Output 2688 ml  Net 241.98 ml   Filed Weights   01/20/22 0339 01/21/22 0500 01/22/22 0700  Weight: 66.9 kg 66.9 kg (Significant) 76.8 kg   Physical Examination: General: 66 year old chronically ill and acutely ill female who remains on multiple vasoactive agents and inotropic support HEENT normocephalic atraumatic mucous membranes moist Pulmonary clear to auscultation diminished bases no accessory use remains on 3 L nasal cannula Cardiac regular irregular with atrial fibrillation on telemetry Abdomen soft not tender Extremities warm diffuse dependent edema has left triple-lumen and arterial line femoral catheter with insertion site appearing clean dry and intact without drainage, the right HD femoral line catheter is also clean dry and intact without drainage pulses are palpable distally Neuro awake, following commands, more oriented today compared to exam on 2/25. GU an uric  Assessment & Plan:  Principal Problem:   Chest pain, Multivessel CAD Active Problems:   Coronary artery disease involving native coronary artery of native heart with unstable angina pectoris ([place])   Pulmonary hypertension (HCC)   Hx of CABG   S/P TVR (tricuspid valve repair)   Hyperlipidemia associated with type 2 diabetes mellitus (HCC)   Anemia of chronic renal failure   Type 2 diabetes mellitus (HCC)   Obesity (BMI 30-39.9)   Chronic combined systolic and diastolic heart failure (HCC)   Atrial fibrillation (HCC)   End stage renal disease on dialysis (HCC)   History of stroke   QT prolongation   SVT (supraventricular tachycardia) (HCC)   Hyponatremia   GERD (gastroesophageal reflux  disease)   Septic shock (HCC)   Gram-negative bacteremia   Cardiogenic shock (HCC)   Tachycardia   S/P CABG (coronary artery bypass graft)   Metabolic encephalopathy   Acute hypoxemic respiratory failure (HCC)  CAD with TR, s/p CABG x 3 and TVR 2/10 Acute-on-chronic systolic/diastolic CHF Plan Post op day 16 Trending co-ox Pull fluids as able   Mixed cardiogenic and septic shock secondary to morganella morganii bacteremia Feel this is more a cardiogenic component at present, given afebrile status, improvement in WBC and ongoing antibiotic coverage. Plan Cont to titrate norepi  Cont epi at current Cont vasopressin 0.03 Cont milrinone    Afib, persistent; RVR resolved Afib with RVR noted 2/21PM with rates into 150s. - Intermittently back into NSR 2/24 AM Plan Cont amiodarone Cont tele  Cont ac   Acute respiratory failure with hypoxia  Combined pre and post capillary PH  Was on Revatio and Opsumit.  Plan Cont supplemental oxygen; pulse ox  Pulm hygiene  Holding Opsumit    ESRD on HD Thrombosed AVF - Nephrology, appreciate assistance with management - CRRT resumed 2/22, likely unable to tolerate iHD in the setting of labile blood pressures Plan CRRT per Nephro Need tunneled cath-->don't see this happening any time soon   Acute metabolic encephalopathy ? ICU delirium, ? Sepsis.  Plan Delirium precautions   HLD DM2 w/ hyperglycemia  Glycemic control looks a little better  Plan Levemir 20 units bid Resistant SSI Tubefeed coverage 8u q 4h  Anemia of critical illness.  Plan Cont trend cbc Transfusion trigger for hgb < 7   Hx PE - ?CTEPH Thrombocytopenia - suspect CRRT related HITT panel 0.075. SRA negative.  Plan Cont Bivalirudin for ease of therapeutic management  Eventually back to POs  Inadequate oral intake related to inability to eat At risk for malnutrition Plan Cont tube feeds SLP to re-assess when medically stable     Best Practice:  (right click and "Reselect all SmartList Selections" daily)   Diet/type: tubefeeds   DVT prophylaxis: other- bivalirudin GI prophylaxis: PPI Lines: Central line, Dialysis Catheter, Arterial Line, and yes and it is still needed  Foley:  N/A Code Status:  full code Last date of multidisciplinary goals of care discussion [Family updated 2/18]   Critical care time    Clementeen Graham, NP [facility] 01/22/22 7:19 AM

## 2022-01-23 ENCOUNTER — Inpatient Hospital Stay (HOSPITAL_COMMUNITY): Payer: Medicare Other

## 2022-01-23 DIAGNOSIS — R570 Cardiogenic shock: Secondary | ICD-10-CM | POA: Diagnosis not present

## 2022-01-23 DIAGNOSIS — I50811 Acute right heart failure: Secondary | ICD-10-CM | POA: Diagnosis not present

## 2022-01-23 DIAGNOSIS — R072 Precordial pain: Secondary | ICD-10-CM | POA: Diagnosis not present

## 2022-01-23 LAB — HEPATIC FUNCTION PANEL
ALT: 78 U/L — ABNORMAL HIGH (ref 0–44)
AST: 99 U/L — ABNORMAL HIGH (ref 15–41)
Albumin: 2.2 g/dL — ABNORMAL LOW (ref 3.5–5.0)
Alkaline Phosphatase: 111 U/L (ref 38–126)
Bilirubin, Direct: <0.1 mg/dL (ref 0.0–0.2)
Total Bilirubin: 0.6 mg/dL (ref 0.3–1.2)
Total Protein: 6.4 g/dL — ABNORMAL LOW (ref 6.5–8.1)

## 2022-01-23 LAB — RENAL FUNCTION PANEL
Albumin: 2.4 g/dL — ABNORMAL LOW (ref 3.5–5.0)
Albumin: 2.4 g/dL — ABNORMAL LOW (ref 3.5–5.0)
Anion gap: 8 (ref 5–15)
Anion gap: 11 (ref 5–15)
BUN: 16 mg/dL (ref 8–23)
BUN: 18 mg/dL (ref 8–23)
CO2: 26 mmol/L (ref 22–32)
CO2: 26 mmol/L (ref 22–32)
Calcium: 9.3 mg/dL (ref 8.9–10.3)
Calcium: 9.7 mg/dL (ref 8.9–10.3)
Chloride: 101 mmol/L (ref 98–111)
Chloride: 98 mmol/L (ref 98–111)
Creatinine, Ser: 1.3 mg/dL — ABNORMAL HIGH (ref 0.44–1.00)
Creatinine, Ser: 1.46 mg/dL — ABNORMAL HIGH (ref 0.44–1.00)
GFR, Estimated: 45 mL/min — ABNORMAL LOW (ref >60)
GFR, Estimated: 39 mL/min — ABNORMAL LOW (ref >60)
Glucose, Bld: 209 mg/dL — ABNORMAL HIGH (ref 70–99)
Glucose, Bld: 227 mg/dL — ABNORMAL HIGH (ref 70–99)
Phosphorus: 3.1 mg/dL (ref 2.5–4.6)
Phosphorus: 3.3 mg/dL (ref 2.5–4.6)
Potassium: 4 mmol/L (ref 3.5–5.1)
Potassium: 4.5 mmol/L (ref 3.5–5.1)
Sodium: 135 mmol/L (ref 135–145)
Sodium: 135 mmol/L (ref 135–145)

## 2022-01-23 LAB — COOXEMETRY PANEL
Carboxyhemoglobin: 2 % — ABNORMAL HIGH (ref 0.5–1.5)
Carboxyhemoglobin: 1.9 % — ABNORMAL HIGH (ref 0.5–1.5)
Carboxyhemoglobin: 0.5 % (ref 0.5–1.5)
Methemoglobin: <0.7 % (ref 0.0–1.5)
Methemoglobin: <0.7 % (ref 0.0–1.5)
Methemoglobin: 3.3 % — ABNORMAL HIGH (ref 0.0–1.5)
O2 Saturation: 98.2 %
O2 Saturation: 58.4 %
O2 Saturation: 62 %
Total hemoglobin: 7.9 g/dL — ABNORMAL LOW (ref 12.0–16.0)
Total hemoglobin: 8.4 g/dL — ABNORMAL LOW (ref 12.0–16.0)
Total hemoglobin: 8.2 g/dL — ABNORMAL LOW (ref 12.0–16.0)

## 2022-01-23 LAB — CBC WITH DIFFERENTIAL/PLATELET
Abs Immature Granulocytes: 0.16 10*3/uL — ABNORMAL HIGH (ref 0.00–0.07)
Basophils Absolute: 0.1 10*3/uL (ref 0.0–0.1)
Basophils Relative: 1 %
Eosinophils Absolute: 0.5 10*3/uL (ref 0.0–0.5)
Eosinophils Relative: 3 %
HCT: 24.2 % — ABNORMAL LOW (ref 36.0–46.0)
Hemoglobin: 7.6 g/dL — ABNORMAL LOW (ref 12.0–15.0)
Immature Granulocytes: 1 %
Lymphocytes Relative: 6 %
Lymphs Abs: 1 10*3/uL (ref 0.7–4.0)
MCH: 30.2 pg (ref 26.0–34.0)
MCHC: 31.4 g/dL (ref 30.0–36.0)
MCV: 96 fL (ref 80.0–100.0)
Monocytes Absolute: 1.3 10*3/uL — ABNORMAL HIGH (ref 0.1–1.0)
Monocytes Relative: 7 %
Neutro Abs: 15 10*3/uL — ABNORMAL HIGH (ref 1.7–7.7)
Neutrophils Relative %: 82 %
Platelets: 171 10*3/uL (ref 150–400)
RBC: 2.52 MIL/uL — ABNORMAL LOW (ref 3.87–5.11)
RDW: 24.1 % — ABNORMAL HIGH (ref 11.5–15.5)
WBC: 18 10*3/uL — ABNORMAL HIGH (ref 4.0–10.5)
nRBC: 10.3 % — ABNORMAL HIGH (ref 0.0–0.2)

## 2022-01-23 LAB — GLUCOSE, CAPILLARY
Glucose-Capillary: 89 mg/dL (ref 70–99)
Glucose-Capillary: 149 mg/dL — ABNORMAL HIGH (ref 70–99)
Glucose-Capillary: 131 mg/dL — ABNORMAL HIGH (ref 70–99)
Glucose-Capillary: 143 mg/dL — ABNORMAL HIGH (ref 70–99)
Glucose-Capillary: 153 mg/dL — ABNORMAL HIGH (ref 70–99)
Glucose-Capillary: 131 mg/dL — ABNORMAL HIGH (ref 70–99)
Glucose-Capillary: 162 mg/dL — ABNORMAL HIGH (ref 70–99)
Glucose-Capillary: 191 mg/dL — ABNORMAL HIGH (ref 70–99)
Glucose-Capillary: 97 mg/dL (ref 70–99)

## 2022-01-23 LAB — MAGNESIUM: Magnesium: 2.4 mg/dL (ref 1.7–2.4)

## 2022-01-23 LAB — APTT: aPTT: 54 seconds — ABNORMAL HIGH (ref 24–36)

## 2022-01-23 MED ORDER — CHLORHEXIDINE GLUCONATE CLOTH 2 % EX PADS
6.0000 | MEDICATED_PAD | Freq: Every day | CUTANEOUS | Status: DC
  Administered 2022-01-24 – 2022-02-04 (×12): 6 via TOPICAL

## 2022-01-23 MED ORDER — MACITENTAN 10 MG PO TABS
10.0000 mg | ORAL_TABLET | Freq: Every day | ORAL | Status: DC
  Administered 2022-01-23: 10 mg via ORAL
  Filled 2022-01-23 (×2): qty 1

## 2022-01-23 NOTE — Progress Notes (Signed)
Speech Language Pathology Treatment: Dysphagia  Patient Details Name: Jody Nguyen MRN: [id number] DOB: 1956/01/30 Today's Date: 01/23/2022 Time: 0712-1975 SLP Time Calculation (min) (ACUTE ONLY): 25 min  Assessment / Plan / Recommendation Clinical Impression  Pt was seen for ongoing dysphagia tx with POC also discussed with Dr. Carlis Abbott and RN. Pt consumed trials of ice chips, followed by spoonfuls of purees and honey thick liquids without overt s/s of aspiration and improving vocal quality per pt and husband report. Multiple swallows are still noted and she still has generalized deconditioning.   Pt remains in the same position if instrumental testing is to be pursued: she did not tolerate attempt at FEES, but she cannot go to radiology for MBS while on CRRT. Per Dr. Carlis Abbott, we may be able to plan for MBS to be completed if we can coordinate it with a planned filter change. She plans to discuss this further with Dr. Roxan Hockey as well and SLP will be available to schedule with radiology accordingly.   In the meantime, would still allow limited quantities of ice chips or purees/honey thick liquids from floor stock to provide some opportunities to utilize swallowing musculature.    HPI HPI: 66 yo F presented to [facility] ED 2/4 with chest pain. ECHO revealed severe RV failure, felt to be  consistent with chronic process such as severe pulmonary HTN. 2/7 cardiac cath - three vessel dz;  2/10 CABGx3, TVR; 2/13 chest tube removed; ETT 2/10-2/16; CVVHD.  PMH ESRD on HD, non-obstructive CAD, suspected pulmonary hypertension, Hx PE on eliquis. Passed [place] on 2/16; RN had concerns for potential dysphagia and requested SLP swallow consult.      SLP Plan  MBS      Recommendations for follow up therapy are one component of a multi-disciplinary discharge planning process, led by the attending physician.  Recommendations may be updated based on patient status, additional functional criteria and  insurance authorization.    Recommendations  Diet recommendations: Other(comment) (small sips of honey thick liquids or bites of purees from floor stock; can have some ice chips as well) Medication Administration: Via alternative means                Oral Care Recommendations: Oral care QID Follow Up Recommendations: Acute inpatient rehab (3hours/day) Assistance recommended at discharge: Frequent or constant Supervision/Assistance SLP Visit Diagnosis: Dysphagia, pharyngeal phase (R13.13) Plan: MBS           [name]., M.A. [facility] Pager [phone] Office [phone]  01/23/2022, 12:49 PM

## 2022-01-23 NOTE — Progress Notes (Signed)
17 Days Post-Op Procedure(s) (LRB): CORONARY ARTERY BYPASS GRAFTING (CABG) TIMES THREE ON CARDIOPULMONARY BYPASS. LIMA TO LAD, SVG TO PD, SVG TO DIAG (N/A) TRICUSPID VALVE REPAIR USING AN 36MM EDWARDS MC3 TRICUSPID ANNULOPLASTY RING (N/A) TRANSESOPHAGEAL ECHOCARDIOGRAM (TEE) (N/A) APPLICATION OF CELL SAVER (N/A) ENDOVEIN HARVEST OF GREATER SAPHENOUS VEIN (Right) REPAIR OF PATENT FORAMEN OVALE (N/A) Subjective: Sleeping, appropriate when aroused  Objective: Vital signs in last 24 hours: Temp:  [97.5 F (36.4 C)-98.6 F (37 C)] 98.3 F (36.8 C) (02/27 0000) Pulse Rate:  [77-111] 77 (02/27 0700) Cardiac Rhythm: Normal sinus rhythm (02/27 0600) Resp:  [11-24] 13 (02/27 0700) BP: (73-104)/(44-59) 93/53 (02/27 0600) SpO2:  [94 %-100 %] 100 % (02/27 0700) Weight:  [76.5 kg] 76.5 kg (02/27 0500)  Hemodynamic parameters for last 24 hours: CVP:  [9 mmHg-18 mmHg] 12 mmHg  Intake/Output from previous day: 02/26 0701 - 02/27 0700 In: 2757.1 [I.V.:1341.3; NG/GT:1010; IV Piggyback:405.9] Out: 2119 [Stool:325] Intake/Output this shift: No intake/output data recorded.  General appearance: alert, cooperative, and no distress Neurologic: intact Heart: regular rate and rhythm Lungs: clear to auscultation bilaterally Abdomen: normal findings: soft, non-tender  Lab Results: Recent Labs    01/22/22 0442 01/23/22 0432  WBC 20.8* 18.0*  HGB 8.0* 7.6*  HCT 25.3* 24.2*  PLT 187 171   BMET:  Recent Labs    01/22/22 1556 01/23/22 0432  NA 133* 135  K 4.4 4.0  CL 98 101  CO2 23 26  GLUCOSE 243* 209*  BUN 16 16  CREATININE 1.25* 1.30*  CALCIUM 9.4 9.3    PT/INR: No results for input(s): LABPROT, INR in the last 72 hours. ABG    Component Value Date/Time   PHART 7.425 01/20/2022 1944   HCO3 23.6 01/20/2022 1944   TCO2 25 01/20/2022 1944   ACIDBASEDEF 1.0 01/20/2022 1944   O2SAT 98.2 01/23/2022 0432   CBG (last 3)  Recent Labs    01/22/22 1556 01/23/22 0420 01/23/22 0729   GLUCAP 168* 143* 153*    Assessment/Plan: S/P Procedure(s) (LRB): CORONARY ARTERY BYPASS GRAFTING (CABG) TIMES THREE ON CARDIOPULMONARY BYPASS. LIMA TO LAD, SVG TO PD, SVG TO DIAG (N/A) TRICUSPID VALVE REPAIR USING AN 36MM EDWARDS MC3 TRICUSPID ANNULOPLASTY RING (N/A) TRANSESOPHAGEAL ECHOCARDIOGRAM (TEE) (N/A) APPLICATION OF CELL SAVER (N/A) ENDOVEIN HARVEST OF GREATER SAPHENOUS VEIN (Right) REPAIR OF PATENT FORAMEN OVALE (N/A) Remains critically ill CV- in SR with PVCs on amiodarone  Co-ox unreliable  On vasopressin 0.03, epi 3, norepi 3, milrinone 0.25,   Pressor requirement less with midodrine RESP- sats are good on 2L [place] RENAL- still on CVVHD, 1.2 L negative yesterday ENDO- CBG mildly elevated GI- tolerating TF  Continue to reassess swallowing ID- completed 10 days of Zosyn  WBC still elevated but down from 21 to 18 Deconditioning- continue PT   LOS: 22 days    Jody Nguyen 01/23/2022

## 2022-01-23 NOTE — Consult Note (Signed)
Advanced Heart Failure Team Consult Note   Primary Physician: Unk Pinto, MD PCP-Cardiologist:  [name], MD PAH: Dr Alric Ran   Reason for Consultation: Pulmonary Hypertension    HPI:    Jody Nguyen is seen today for evaluation of pulmonary hypertension at the request of Dr Carlis Abbott.   Jody Nguyen is a 66 year old with a history of ESRD, PAH, CVA 1997, HTN,  DMII, hyperlipidemia, PE, and MVR in 1610 complicated by cardiogenic shock requiring ECMO.    RHC/LHC 06/18/2015: mLAD 30%; severe pulmo HTN with PA sat 43%, CI 1.86, prominent V waves indicative of MR; resting hypoxemia O2 sat 86% on RA  -RHC 04/2019: RA 10, PA 65/31 (43), PWCP 21, BP 187 -RHC 09/2019: RA 10, PA 55/25 (36), PCWP 18, Pa sat 71, PVR 3.96, Fick CO 4.81 (Fick 2.69), CO 5.43 (CI 3.02).  - RHC 08/2021 RA 8 PA 50/23 (32), PCWP 25 PVR 1.9 Fick CO 5.8 CI 3.2  on opsumit + sildenafil 60 mg tid.   -07/2020 VQ scan +. Started on coumadin  - 11/2020 VQ : unchanged left lower lobe apical segment PE  PFT 08/12/20 Moderately severe restrictive defect with severe diffusion impairment. 08/12/20 ct chest- early fibrosis: Minimal subpleural reticulation within the anterior right middle lobe and dependent right upper lobe which may relate to early fibrosis  Followed at [facility]. She has been on sildenafil 60 mg tid + opsumit.   Admitted with chest pain. EKG was nonspecific. Continued with intermittent chest pain associated with SVT.  Taken to the cath and had 99% prox LAD, 90% 1st diag, 100% ost CA to prox cx, and 100 % prox RCA. VQ completed 2/6 was low probability for PE. CT surgery consulted and deemed appropriate CABG &TVR. 01/02/2022 had CABG x3 LIMA to LAD, SVG to Diagonal, and SVG to PDA  She also underwent closure of PFO + tricuspid valve annuloplasty.   Remains on multiple pressors -> Vaso 0.Marland Kitchen03 units, Epi 3 mcg, Norepi 5  mcg, and Milrinone 0.25 mcg. Also on midodrine. CO-OX 58%. Nephrology following for  CVVHD.   2/18 - Bld Cx -- NGTD/18    Echo 2/19 /2350-55% RV severely reduced.    Review of Systems: [y] = yes, [ ]  = no   General: Weight gain [ ] ; Weight loss [ ] ; Anorexia [ ] ; Fatigue [ y]; Fever [ ] ; Chills [ ] ; Weakness [ y]  Cardiac: Chest pain/pressure Blue.Reese ]; Resting SOB Blue.Reese ]; Exertional SOB [ y]; Orthopnea [ ] ; Pedal Edema [ y]; Palpitations [ ] ; Syncope [ ] ; Presyncope [ ] ; Paroxysmal nocturnal dyspnea[ ]   Pulmonary: Cough [ y]; Wheezing[ ] ; Hemoptysis[ ] ; Sputum [ ] ; Snoring [ y]  GI: Vomiting[ ] ; Dysphagia[ ] ; Melena[ ] ; Hematochezia [ ] ; Heartburn[ ] ; Abdominal pain [ ] ; Constipation [ ] ; Diarrhea [ ] ; BRBPR [ ]   GU: Hematuria[ ] ; Dysuria [ ] ; Nocturia[ ]   Vascular: Pain in legs with walking [ ] ; Pain in feet with lying flat [ ] ; Non-healing sores [ ] ; Stroke [ ] ; TIA [ ] ; Slurred speech [ ] ;  Neuro: Headaches[ ] ; Vertigo[ ] ; Seizures[ ] ; Paresthesias[ ] ;Blurred vision [ ] ; Diplopia [ ] ; Vision changes [ ]   Ortho/Skin: Arthritis [ y]; Joint pain Blue.Reese ]; Muscle pain Blue.Reese ]; Joint swelling [ ] ; Back Pain Blue.Reese ]; Rash [ ]   Psych: Depression[y ]; Anxiety[ y]  Heme: Bleeding problems [ ] ; Clotting disorders [ y]; Anemia [ ]   Endocrine: Diabetes [  Y ]; Thyroid dysfunction[ ]   Home Medications Prior to Admission medications   Medication Sig Start Date End Date Taking? Authorizing Provider  acetaminophen (TYLENOL) 500 MG tablet Take 500 mg by mouth as needed (for pain.).   Yes [provider]  apixaban (ELIQUIS) 5 MG TABS tablet Take 5 mg by mouth 2 (two) times daily.   Yes [provider]  aspirin EC 81 MG tablet Take 81 mg by mouth daily.   Yes [provider]  carvedilol (COREG) 25 MG tablet Take 12.5 mg by mouth 2 (two) times daily.   Yes [provider]  cinacalcet (SENSIPAR) 60 MG tablet Take 1 tablet (60 mg total) by mouth daily. Patient taking differently: Take 60 mg by mouth See admin instructions. Takes at dialysis M/W/F 09/11/17  Yes Lucky Cowboy, MD  hydrALAZINE (APRESOLINE) 50 MG tablet Take 1 tablet (50 mg total) by mouth 3 (three) times daily. FOR BLOOD  PRESSURE AND HEART 11/09/20  Yes Lewayne Bunting, MD  latanoprost (XALATAN) 0.005 % ophthalmic solution Place 1 drop into both eyes at bedtime. 09/16/18  Yes [provider]  losartan (COZAAR) 50 MG tablet Take 1 tablet Daily for BP Patient taking differently: Take 50 mg by mouth daily. 03/19/20  Yes Lucky Cowboy, MD  macitentan (OPSUMIT) 10 MG tablet Take 10 mg by mouth daily.   Yes [provider]  nitroGLYCERIN (NITROSTAT) 0.4 MG SL tablet Place 0.4 mg under the tongue every 5 (five) minutes x 3 doses as needed for chest pain. 08/24/21  Yes [provider]  ondansetron (ZOFRAN ODT) 4 MG disintegrating tablet Take 1 tablet (4 mg total) by mouth every 8 (eight) hours as needed for nausea or vomiting. 02/23/19  Yes Fawze, Mina A, PA-C  pantoprazole (PROTONIX) 40 MG tablet Take 1 tablet (40 mg total) by mouth daily. Patient taking differently: Take 40 mg by mouth daily as needed (GERD). 08/29/21 01/01/23 Yes Dorcas Carrow, MD  RENVELA 800 MG tablet Take 1,600-2,400 mg by mouth 3 (three) times daily with meals. 12/21/15  Yes [provider]  rosuvastatin (CRESTOR) 20 MG tablet Take 1 tablet Daily for Cholesterol Patient taking differently: Take 20 mg by mouth at bedtime. 03/19/20  Yes Lucky Cowboy, MD  sertraline (ZOLOFT) 25 MG tablet Take 1 tablet Daily for Mood Patient taking differently: Take 25 mg by mouth daily. 03/19/20  Yes Lucky Cowboy, MD  sevelamer carbonate (RENVELA) 2.4 g PACK Take 2.4 g by mouth 3 (three) times daily with meals.   Yes [provider]  sildenafil (REVATIO) 20 MG tablet Take 2 tablets (40 mg) 3 x  /day fr Pulmonary Hypertension Patient taking differently: Take 40 mg by mouth 3 (three) times daily. 03/26/20  Yes Lucky Cowboy, MD  azelastine (ASTELIN) 0.1 % nasal spray Use 1 to 2 sprays each nostril 2  to 3 x / day Patient not taking: Reported on 01/01/2022 03/26/18   Lucky Cowboy, MD    Past Medical History: Past Medical History:  Diagnosis Date   Anemia    Chronic diastolic CHF (congestive heart failure) (HCC)    Chronic diastolic heart failure (HCC)    CKD (chronic kidney disease) stage 4, GFR 15-29 ml/min (HCC)    dialysis M/W/F   Constipation    CVA (cerebral infarction) 1997   no residual deficit   Diverticulitis    DM (diabetes mellitus) (HCC)    type 2   Heart murmur    History of cardiovascular stress test  a. Myoview Oct 2012 showed EF 49%, no ischemia, LVE   HLD (hyperlipidemia)    takes Crestor daily   Hypertension    Pericardial effusion    chronic; felt to be poss related to minoxidil >> DC'd   Pulmonary hypertension ([place]) 06/18/2015   Respiratory failure, acute hypoxic, post-operative 07/08/2015   Requiring ECMO support   Respiratory failure, post-operative (HCC)    S/P cardiac catheterization    a. R/L HC 06/18/15:  mLAD 30%; severe pulmo HTN with PA sat 43%, CI 1.86, prominent V waves indicative of MR; resting hypoxemia O2 sat 86% on RA   S/P minimally invasive mitral valve repair 07/08/2015   Complex valvuloplasty including artificial Gore-tex neochord placement x10 and 26 mm Sorin Memo 3D Rechord ring annuloplasty via right mini thoracotomy approach   S/P minimally invasive mitral valve repair 07/08/2015   Complex valvuloplasty including artificial Gore-tex neochord placement x10 and 26 mm Sorin Memo 3D Rechord ring annuloplasty via right mini thoracotomy approach   Severe mitral regurgitation    Shortness of breath dyspnea    Stroke ([place]) 1997   no residual effect   Vitamin D deficiency    Wears glasses     Past Surgical History: Past Surgical History:  Procedure Laterality Date   A/V FISTULAGRAM Right 08/16/2017   Procedure: A/V Fistulagram;  Surgeon: Conrad Marble, MD;  Location: [place] [facility];  Service: Cardiovascular;  Laterality: Right;    AV FISTULA PLACEMENT Left 10/22/2015   Procedure: RADIOCEPHALIC ARTERIOVENOUS (AV) FISTULA CREATION;  Surgeon: Serafina Mitchell, MD;  Location: [place] [facility];  Service: Vascular;  Laterality: Left;   AV FISTULA PLACEMENT Right 05/22/2017   Procedure: ARTERIOVENOUS (AV) FISTULA CREATION-RIGHT ARM;  Surgeon: Waynetta Sandy, MD;  Location: [place];  Service: Vascular;  Laterality: Right;   CANNULATION FOR CARDIOPULMONARY BYPASS N/A 07/08/2015   Procedure: CANNULATION FOR ECMO;  Surgeon: Rexene Alberts, MD;  Location: [place];  Service: Open Heart Surgery;  Laterality: N/A;   CARDIAC CATHETERIZATION     CARDIAC CATHETERIZATION N/A 06/18/2015   Procedure: Right/Left Heart Cath and Coronary Angiography;  Surgeon: Jettie Booze, MD;  Location: [place] [facility];  Service: Cardiovascular;  Laterality: N/A;   CESAREAN SECTION     x 2   COLONOSCOPY     CORONARY ARTERY BYPASS GRAFT N/A 01/22/2022   Procedure: CORONARY ARTERY BYPASS GRAFTING (CABG) TIMES THREE ON CARDIOPULMONARY BYPASS. LIMA TO LAD, SVG TO PD, SVG TO DIAG;  Surgeon: [name], MD;  Location: [place];  Service: Open Heart Surgery;  Laterality: N/A;   ENDOVEIN HARVEST OF GREATER SAPHENOUS VEIN Right 01/10/2022   Procedure: ENDOVEIN HARVEST OF GREATER SAPHENOUS VEIN;  Surgeon: [name], MD;  Location: [place];  Service: Open Heart Surgery;  Laterality: Right;  RIGHT UPPER AND LOWER LEG   EXCHANGE OF A DIALYSIS CATHETER N/A 06/28/2017   Procedure: EXCHANGE OF A DIALYSIS CATHETER;  Surgeon: Waynetta Sandy, MD;  Location: [place];  Service: Vascular;  Laterality: N/A;   FISTULA SUPERFICIALIZATION Left 12/28/2015   Procedure: SUPERFICIALIZATION LEFT RADIOCEPHALIC FISTULA;  Surgeon: Serafina Mitchell, MD;  Location: [place];  Service: Vascular;  Laterality: Left;   FISTULA SUPERFICIALIZATION Right 08/21/2017   Procedure: FISTULA SUPERFICIALIZATION RIGHT ARM;  Surgeon: Waynetta Sandy, MD;  Location: [place];  Service:  Vascular;  Laterality: Right;   INSERTION OF DIALYSIS CATHETER Right 04/28/2017   Procedure: INSERTION OF DIALYSIS CATHETER-RIGHT INTERNAL JUGULAR PLACEMENT;  Surgeon: Ruta Hinds  E, MD;  Location: [place];  Service: Vascular;  Laterality: Right;   LEFT HEART CATH AND CORONARY ANGIOGRAPHY N/A 01/02/2022   Procedure: LEFT HEART CATH AND CORONARY ANGIOGRAPHY;  Surgeon: Troy Sine, MD;  Location: [place] [facility];  Service: Cardiovascular;  Laterality: N/A;   LIGATION OF ARTERIOVENOUS  FISTULA Left 04/28/2017   Procedure: LIGATION OF ARTERIOVENOUS  FISTULA;  Surgeon: Elam Dutch, MD;  Location: [place];  Service: Vascular;  Laterality: Left;   MITRAL VALVE REPAIR Right 07/08/2015   Procedure: MINIMALLY INVASIVE MITRAL VALVE REPAIR with a 26 Sorin Memo 3D Rechord;  Surgeon: Rexene Alberts, MD;  Location: [place];  Service: Open Heart Surgery;  Laterality: Right;   MULTIPLE EXTRACTIONS WITH ALVEOLOPLASTY N/A 06/30/2015   Procedure: MULTIPLE EXTRACTIONS OF TOOTH #'S 4  AND 30 WITH ALVEOLOPLASTY AND GROSS DEBRIDEMENT  OF REMAINING TEETH;  Surgeon: Lenn Cal, DDS;  Location: [place];  Service: Oral Surgery;  Laterality: N/A;   PERIPHERAL VASCULAR CATHETERIZATION Left 03/28/2016   Procedure: A/V Fistulagram;  Surgeon: Serafina Mitchell, MD;  Location: [place] [facility];  Service: Cardiovascular;  Laterality: Left;  lower arm   REPAIR OF PATENT FORAMEN OVALE N/A 01/10/2022   Procedure: REPAIR OF PATENT FORAMEN OVALE;  Surgeon: [name], MD;  Location: [place];  Service: Open Heart Surgery;  Laterality: N/A;   TEE WITHOUT CARDIOVERSION N/A 06/08/2015   Procedure: TRANSESOPHAGEAL ECHOCARDIOGRAM (TEE);  Surgeon: Lelon Perla, MD;  Location: [facility];  Service: Cardiovascular;  Laterality: N/A;   TEE WITHOUT CARDIOVERSION N/A 07/08/2015   Procedure: TRANSESOPHAGEAL ECHOCARDIOGRAM (TEE);  Surgeon: Rexene Alberts, MD;  Location: [place];  Service: Open Heart Surgery;  Laterality: N/A;   TEE  WITHOUT CARDIOVERSION N/A 01/03/2022   Procedure: TRANSESOPHAGEAL ECHOCARDIOGRAM (TEE);  Surgeon: [name], MD;  Location: [place];  Service: Open Heart Surgery;  Laterality: N/A;   TRICUSPID VALVE REPLACEMENT N/A 01/20/2022   Procedure: TRICUSPID VALVE REPAIR USING AN 36MM EDWARDS MC3 TRICUSPID ANNULOPLASTY RING;  Surgeon: [name], MD;  Location: [place];  Service: Open Heart Surgery;  Laterality: N/A;    Family History: Family History  Problem Relation Age of Onset   Stroke Mother    Breast cancer Sister    Cancer Sister        breast cancer   Heart attack Brother        MI in his 65s   Heart disease Brother        before age 54   Cancer Brother     Social History: Social History   Socioeconomic History   Marital status: Married    Spouse name: Not on file   Number of children: 3   Years of education: Not on file   Highest education level: Not on file  Occupational History    Comment: Unemployed; used to work as a Radio broadcast assistant  Tobacco Use   Smoking status: Never   Smokeless tobacco: Never  Vaping Use   Vaping Use: Never used  Substance and Sexual Activity   Alcohol use: No    Alcohol/week: 0.0 standard drinks   Drug use: No   Sexual activity: Not on file  Other Topics Concern   Not on file  Social History Narrative   Not on file   Social Determinants of Health   Financial Resource Strain: Not on file  Food Insecurity: No Food Insecurity   Worried About Charity fundraiser in  the Last Year: Never true   Ran Out of Food in the Last Year: Never true  Transportation Needs: No Transportation Needs   Lack of Transportation (Medical): No   Lack of Transportation (Non-Medical): No  Physical Activity: Not on file  Stress: Not on file  Social Connections: Not on file    Allergies:  Allergies  Allergen Reactions   Minoxidil Other (See Comments)    Pericardial effusion   Lipitor [Atorvastatin] Other (See Comments)    MYALGIAS  > "pain in legs" Tolerates rosuvastatin   Morphine And Related Itching   Ace Inhibitors Other (See Comments)    REACTION: "not sure...think it made me drowsy all the time"    Objective:    Vital Signs:   Temp:  [97.2 F (36.2 C)-98.6 F (37 C)] 97.6 F (36.4 C) (02/27 1532) Pulse Rate:  [77-111] 84 (02/27 0800) Resp:  [11-22] 14 (02/27 0800) BP: (87-104)/(45-59) 89/53 (02/27 0800) SpO2:  [96 %-100 %] 100 % (02/27 0800) Weight:  [76.5 kg] 76.5 kg (02/27 0500) Last BM Date : 01/22/22  Weight change: Filed Weights   01/21/22 0500 01/22/22 0700 01/23/22 0500  Weight: 66.9 kg (S) 76.8 kg 76.5 kg    Intake/Output:   Intake/Output Summary (Last 24 hours) at 01/23/2022 1558 Last data filed at 01/23/2022 1400 Gross per 24 hour  Intake 2561.7 ml  Output 4024 ml  Net -1462.3 ml      Physical Exam    General:  In bed . No resp difficulty HEENT: + [place]rak  Neck: supple. JVP . Carotids 2+ bilat; no bruits. No lymphadenopathy or thyromegaly appreciated. Cor: PMI nondisplaced. Regular rate & rhythm. No rubs, gallops or murmurs. Lungs: clear on 1 liter [place]  Abdomen: soft, nontender, nondistended. No hepatosplenomegaly. No bruits or masses. Good bowel sounds. Extremities: no cyanosis, clubbing, rash, edema. L femoral central line. R HD catheter.  Neuro: alert & orientedx3, cranial nerves grossly intact. moves all 4 extremities w/o difficulty. Affect flat    Telemetry   SR 90s   Labs   Basic Metabolic Panel: Recent Labs  Lab 01/20/22 0444 01/20/22 1607 01/20/22 1616 01/20/22 1641 01/21/22 0416 01/21/22 1610 01/22/22 0442 01/22/22 0444 01/22/22 1556 01/23/22 0432  NA 135   < >  --    < > 135 138  --  136 133* 135  K 4.5   < >  --    < > 4.7 4.0  --  4.3 4.4 4.0  CL 101   < >  --   --  100 103  --  101 98 101  CO2 25   < >  --   --  25 26  --  $R'26 23 26  'so$ GLUCOSE 168*   < >  --   --  187* 79  --  207* 243* 209*  BUN 18   < >  --   --  17 16  --  $R'16 16 16  'Sh$ CREATININE  1.20*   < >  --   --  1.28* 1.20*  --  1.23* 1.25* 1.30*  CALCIUM 9.6   < >  --   --  9.7 9.8  --  9.4 9.4 9.3  MG 2.6*  --  2.6*  --  2.5*  --  2.5*  --   --  2.4  PHOS 2.5   < >  --   --  2.4* 2.1*  --  2.3* 1.8* 3.1   < > = values  in this interval not displayed.    Liver Function Tests: Recent Labs  Lab 01/21/22 0416 01/21/22 1610 01/22/22 0444 01/22/22 1556 01/23/22 0432  AST  --   --   --   --  99*  ALT  --   --   --   --  78*  ALKPHOS  --   --   --   --  111  BILITOT  --   --   --   --  0.6  PROT  --   --   --   --  6.4*  ALBUMIN 2.4* 2.4* 2.3* 2.2* 2.2*   2.4*   No results for input(s): LIPASE, AMYLASE in the last 168 hours. Recent Labs  Lab 01/17/22 0912  AMMONIA 22    CBC: Recent Labs  Lab 01/19/22 0357 01/19/22 1554 01/20/22 0444 01/20/22 1641 01/20/22 1944 01/21/22 0416 01/21/22 0917 01/22/22 0442 01/23/22 0432  WBC 17.1*  --  14.2*  --   --  19.5* 18.0* 20.8* 18.0*  NEUTROABS 14.9*  --  11.3*  --   --  14.6*  --  16.7* 15.0*  HGB 7.1*   < > 7.2*   < > 7.8* 7.0* 7.1* 8.0* 7.6*  HCT 23.9*   < > 23.8*   < > 23.0* 23.0* 23.5* 25.3* 24.2*  MCV 97.2  --  97.9  --   --  98.7 95.9 95.1 96.0  PLT 188  --  208  --   --  219 190 187 171   < > = values in this interval not displayed.    Cardiac Enzymes: No results for input(s): CKTOTAL, CKMB, CKMBINDEX, TROPONINI in the last 168 hours.  BNP: BNP (last 3 results) Recent Labs    12/20/21 2246 01/01/22 0941  BNP 1,753.1* 1,432.4*    ProBNP (last 3 results) No results for input(s): PROBNP in the last 8760 hours.   CBG: Recent Labs  Lab 01/22/22 2315 01/23/22 0420 01/23/22 0729 01/23/22 1115 01/23/22 1529  GLUCAP 131* 143* 153* 131* 162*    Coagulation Studies: No results for input(s): LABPROT, INR in the last 72 hours.   Imaging   No results found.   Medications:     Current Medications:  aspirin EC  325 mg Oral Daily   Or   aspirin  324 mg Per Tube Daily   bisacodyl  10 mg Oral  Daily   Or   bisacodyl  10 mg Rectal Daily   chlorhexidine gluconate (MEDLINE KIT)  15 mL Mouth Rinse BID   Chlorhexidine Gluconate Cloth  6 each Topical Daily   darbepoetin (ARANESP) injection - NON-DIALYSIS  100 mcg Subcutaneous Q Sat-1800   docusate  200 mg Per Tube Daily   doxercalciferol  9 mcg Intravenous Q M,W,F-HD   feeding supplement (PROSource TF)  45 mL Per Tube BID   insulin aspart  0-20 Units Subcutaneous Q4H   insulin aspart  8 Units Subcutaneous Q4H   insulin detemir  20 Units Subcutaneous Q12H   latanoprost  1 drop Both Eyes QHS   macitentan  10 mg Oral Daily   midodrine  10 mg Oral TID WC   multivitamin  1 tablet Per Tube QHS   pantoprazole sodium  40 mg Per Tube Daily   rosuvastatin  20 mg Per Tube QHS   sodium chloride flush  10-40 mL Intracatheter Q12H   sodium chloride flush  3 mL Intravenous Q12H   Thrombi-Pad  1 each Topical Once  Infusions:   prismasol BGK 4/2.5 500 mL/hr at 01/23/22 0825    prismasol BGK 4/2.5 500 mL/hr at 01/22/22 1740   sodium chloride 10 mL/hr at 01/23/22 1400   amiodarone 30 mg/hr (01/23/22 1400)   bivalirudin (ANGIOMAX) infusion 0.5 mg/mL (Non-ACS indications) 0.0135 mg/kg/hr (01/23/22 1400)   epinephrine 3 mcg/min (01/23/22 1400)   feeding supplement (VITAL 1.5 CAL) 50 mL/hr at 01/23/22 1200   milrinone 0.25 mcg/kg/min (01/23/22 1400)   norepinephrine (LEVOPHED) Adult infusion 6 mcg/min (01/23/22 1400)   prismasol BGK 4/2.5 1,400 mL/hr at 01/23/22 1251   vasopressin 0.03 Units/min (01/23/22 1400)      Patient Profile   Jody Drone is a 66 year old with a history of ESRD, PAH, CVA 1997, HTN,  DMII, hyperlipidemia, PE, and MVR in 7121 complicated by ECMO.    Assessment/Plan   1. Post-cardiotomy cardiogenic shock - likely due predominantly to end-stage RV failure. (Required ECMO in 2016 post MVR) - continue pressor support with NE, EPI, milrinone and VP - has been restarted on home macitentan but not sildenafil due to low  BP - prognosis quite poor at this point.  - I think she will need a PA cath for [place] to hone our therapies. The complicating factor is lack of venous access sites in setting of long-term HD. I think the only option will be to place a sheath above her existing TLC in the left femoral vein. We will plan to do this tomorrow if possible  2. PAH with cor-pulmonale, end-stage - I have reviewed recent echo as well as all of her RHCs from [place] - she clearly has mixed PH (predominantly WHO Groups II-III but perhaps contributions from I & IV) - will continue current therapies. Given her hemodynamics I doubt she will have much to gain from further selective pulmonary artery vasodilators but we can use PA cath to help figure that out  3. Severe 3vCAD s/p CABG x3 with closure PFO + TV ring on 01/22/2022 - no current s/s angina - continue ASA/Statin  4. ESRD - currently on CVVHD  5. SVT/possible PAF -- continue IV amio and heparin  6. H/O PE - 11/2020 VQ : unchanged left lower lobe apical segment PE - VQ 01/02/22 low prob - continue heparin  6. H/O MV Ring 2016 - stable on echo   7. DM2 - cont SSI  Length of Stay: [place], NP  01/23/2022, 3:58 PM  Advanced Heart Failure Team Pager [phone] (M-F; 7a - 5p)  Please contact [facility] for night-coverage after hours (4p -7a ) and weekends on [email or web address]   Agree with above  80 y/o woman with obesity, DM2, previous PE, MVR, ESRD.  Underwent mini MV repair in 2016 c/b RV failure requiring ECMO. Subsequently followed at [place] for mixed [place] on macitentan and sildenafil.   Admitted with CP. Found to have severe 3v CAD with CTO RCA & LCX and critical ostial LAD lesion. Underwent CABG and PFC closure 01/02/2022. Has had complicated post CABG course with RV failure, bacteremia and persistent hypotension. Now on NE, epi, milrinone and VP. On CVVHD. Unable to get swan pre-CABG due to inability to pass the wire in IJs.   On exam she is awake and  alert but confused. Unable to give me adequate history.   Denies CP or SOB.   General:  Ill appearing. No resp difficulty HEENT: normal + cor-trak Neck: supple. Hard to see JVP  Carotids 2+ bilat; no bruits. No lymphadenopathy or  thryomegaly appreciated. Cor: PMI nondisplaced. Regular tachyNo rubs, gallops or murmurs. Lungs:coarse anteriorly Abdomen:obese  soft, nontender, nondistended. No hepatosplenomegaly. No bruits or masses. Good bowel sounds. Extremities: no cyanosis, clubbing, rash, tr edema RFV hd cath. LFV TLC  Large AVF RUE Neuro: alert. Mildly confused cranial nerves grossly intact. moves all 4 extremities w/o difficulty. Affect pleasant  Very difficult situation. She has end-stage RV failure at baselin due to longstanding mixed PH and required ECMO post mini-MVR in 2016. Now with recurrent RV failure/shock with inability to wean from multiple pressors post-CABG.   I think prognosis is extremely poor but suspect only chance we have hear is to clearly define her physiology with a PA cath and try to adjust pressors/inotropes accordingly. She has very limited access and only option seems to be to place PA cath in [facility] above existing central line. Will plan to try and do this tomorrow.   D/w Dr. Carlis Abbott at bedside.   CRITICAL CARE Performed by: Glori Bickers  Total critical care time: 60 minutes  Critical care time was exclusive of separately billable procedures and treating other patients.  Critical care was necessary to treat or prevent imminent or life-threatening deterioration.  Critical care was time spent personally by me (independent of midlevel providers or residents) on the following activities: development of treatment plan with patient and/or surrogate as well as nursing, discussions with consultants, evaluation of patient's response to treatment, examination of patient, obtaining history from patient or surrogate, ordering and performing treatments and interventions,  ordering and review of laboratory studies, ordering and review of radiographic studies, pulse oximetry and re-evaluation of patient's condition.  Glori Bickers, MD  10:36 PM

## 2022-01-23 NOTE — TOC Progression Note (Signed)
Transition of Care [facility]) - Progression Note    Patient Details  Name: Jody Nguyen MRN: [id number] Date of Birth: 05/12/1956  Transition of Care [facility]) CM/SW Contact  Graves-Bigelow, Ocie Cornfield, RN Phone Number: 01/23/2022, 11:51 AM  Clinical Narrative: Patient was discussed in progression rounds. Patient continues on CRRT and has Cortrak. Case Manager will continue to follow for disposition needs.    Expected Discharge Plan: [place] Barriers to Discharge: Continued Medical Work up  Expected Discharge Plan and Services Expected Discharge Plan: [place] In-house Referral: NA Discharge Planning Services: CM Consult Post Acute Care Choice: [place] arrangements for the past 2 months: Single Family Home                   DME Agency: NA    Readmission Risk Interventions No flowsheet data found.

## 2022-01-23 NOTE — Progress Notes (Signed)
[facility] Progress Note   Assessment/ Plan:   OP HD: MWF [facility] 3:15h   450/A1.5   75.5kg   2K/2Ca bath  P2   AVF  Hep  2500 - Hectoral 62mcg IV q HD - Sensipar $RemoveBe'90mg'KzDXQGKvR$  PO q HD - Mircera 159mcg IV q 2 weeks (ordered, not yet given. Last Mircera 100 on 12/07/21)       Assessment/Plan:   CAD s/p CABG x3, TR sp TVR:  per CT surgery   ESRD:  normally MWF schedule, last [facility] Thursday 2/09. CRRT started 2/11. Did not tolerate CRRT holiday due to ^wob/ hypoxia. Continue CRRT--> once appropriate to switch to IHD will likely need to do it the very next day to maintain volume   Shock - cardiogenic and septic. Remains on pressors (vaso/ levo) and inotropes (epi/milrinone). Morganella bacteremia -  D10 of Zosyn. BCx's Volume: remains in shock. Continue 60 cc/hr UF as tolerated. Focal RUE edema, minimal other edema.   Anemia CKD: Hb down, getting prbc's today. Sq darbe 100ug weekly on Saturday. 1st 2/25.  Metabolic bone disease: resume home binders and sensipar when taking PO. Cont IV vdra.   Infiltrated AVF - AVF infiltrated on 2/7. Post-op AVF is nonfunctional, VVS aware. Planning for [facility] when more stable.  Pulm HTN:  Followed at [facility], on Revatio and opsumit  T2DM-per primary service  Hx of MV replacement - in 2016  Subjective:    Seen in room.  Feeling tired and worn out.  Remains on CRRT.  3.9L UF yesterday- net neg 1.2 L   Objective:   BP (!) 89/53 (BP Location: Left Arm)    Pulse 84    Temp 97.6 F (36.4 C) (Oral)    Resp 14    Ht 4' 11.5" (1.511 m)    Wt 76.5 kg    SpO2 100%    BMI 33.49 kg/m   Physical Exam: XIP:JASNK, NAD CVS: tachy Resp: coarse Abd: soft Ext: 1+ anasarca ACCESS: nontunneled HD cath  Labs: BMET Recent Labs  Lab 01/20/22 0444 01/20/22 1607 01/20/22 1641 01/20/22 1944 01/21/22 0416 01/21/22 1610 01/22/22 0444 01/22/22 1556 01/23/22 0432  NA 135 136 136 136 135 138 136 133* 135  K 4.5 4.5 4.5 4.5 4.7 4.0 4.3 4.4 4.0  CL 101 103  --   --   100 103 101 98 101  CO2 25 21*  --   --  $R'25 26 26 23 26  'pk$ GLUCOSE 168* 176*  --   --  187* 79 207* 243* 209*  BUN 18 17  --   --  $R'17 16 16 16 16  'Nf$ CREATININE 1.20* 1.23*  --   --  1.28* 1.20* 1.23* 1.25* 1.30*  CALCIUM 9.6 9.4  --   --  9.7 9.8 9.4 9.4 9.3  PHOS 2.5 2.6  --   --  2.4* 2.1* 2.3* 1.8* 3.1   CBC Recent Labs  Lab 01/20/22 0444 01/20/22 1641 01/21/22 0416 01/21/22 0917 01/22/22 0442 01/23/22 0432  WBC 14.2*  --  19.5* 18.0* 20.8* 18.0*  NEUTROABS 11.3*  --  14.6*  --  16.7* 15.0*  HGB 7.2*   < > 7.0* 7.1* 8.0* 7.6*  HCT 23.8*   < > 23.0* 23.5* 25.3* 24.2*  MCV 97.9  --  98.7 95.9 95.1 96.0  PLT 208  --  219 190 187 171   < > = values in this interval not displayed.      Medications:  aspirin EC  325 mg Oral Daily   Or   aspirin  324 mg Per Tube Daily   bisacodyl  10 mg Oral Daily   Or   bisacodyl  10 mg Rectal Daily   chlorhexidine gluconate (MEDLINE KIT)  15 mL Mouth Rinse BID   Chlorhexidine Gluconate Cloth  6 each Topical Daily   darbepoetin (ARANESP) injection - NON-DIALYSIS  100 mcg Subcutaneous Q Sat-1800   docusate  200 mg Per Tube Daily   doxercalciferol  9 mcg Intravenous Q M,W,F-HD   feeding supplement (PROSource TF)  45 mL Per Tube BID   insulin aspart  0-20 Units Subcutaneous Q4H   insulin aspart  8 Units Subcutaneous Q4H   insulin detemir  20 Units Subcutaneous Q12H   latanoprost  1 drop Both Eyes QHS   macitentan  10 mg Oral Daily   midodrine  10 mg Oral TID WC   multivitamin  1 tablet Per Tube QHS   pantoprazole sodium  40 mg Per Tube Daily   rosuvastatin  20 mg Per Tube QHS   sodium chloride flush  10-40 mL Intracatheter Q12H   sodium chloride flush  3 mL Intravenous Q12H   Thrombi-Pad  1 each Topical Once     Madelon Lips, MD 01/23/2022, 11:16 AM

## 2022-01-23 NOTE — Progress Notes (Signed)
EVENING ROUNDS NOTE :     [address]       [place] [address]             [phone]                 17 Days Post-Op Procedure(s) (LRB): CORONARY ARTERY BYPASS GRAFTING (CABG) TIMES THREE ON CARDIOPULMONARY BYPASS. LIMA TO LAD, SVG TO PD, SVG TO DIAG (N/A) TRICUSPID VALVE REPAIR USING AN 36MM EDWARDS MC3 TRICUSPID ANNULOPLASTY RING (N/A) TRANSESOPHAGEAL ECHOCARDIOGRAM (TEE) (N/A) APPLICATION OF CELL SAVER (N/A) ENDOVEIN HARVEST OF GREATER SAPHENOUS VEIN (Right) REPAIR OF PATENT FORAMEN OVALE (N/A)   Total Length of Stay:  LOS: 22 days  Events:   No events today Pulling fluid on CRRT Tolerating tube feeds    BP (!) 89/53 (BP Location: Left Arm)    Pulse 84    Temp 97.6 F (36.4 C) (Axillary)    Resp 14    Ht 4' 11.5" (1.511 m)    Wt 76.5 kg    SpO2 100%    BMI 33.49 kg/m   CVP:  [9 mmHg-19 mmHg] 19 mmHg       prismasol BGK 4/2.5 500 mL/hr at 01/23/22 0825    prismasol BGK 4/2.5 500 mL/hr at 01/22/22 1740   sodium chloride 10 mL/hr at 01/23/22 1400   amiodarone 30 mg/hr (01/23/22 1400)   bivalirudin (ANGIOMAX) infusion 0.5 mg/mL (Non-ACS indications) 0.0135 mg/kg/hr (01/23/22 1400)   epinephrine 3 mcg/min (01/23/22 1400)   feeding supplement (VITAL 1.5 CAL) 50 mL/hr at 01/23/22 1200   milrinone 0.25 mcg/kg/min (01/23/22 1400)   norepinephrine (LEVOPHED) Adult infusion 6 mcg/min (01/23/22 1400)   prismasol BGK 4/2.5 1,400 mL/hr at 01/23/22 1251   vasopressin 0.03 Units/min (01/23/22 1400)    I/O last 3 completed shifts: In: 3747.5 [I.V.:1831.6; NG/GT:1460; IV Piggyback:455.9] Out: 4650 [PTWSF:6812; Stool:530]   CBC Latest Ref Rng & Units 01/23/2022 01/22/2022 01/21/2022  WBC 4.0 - 10.5 K/uL 18.0(H) 20.8(H) 18.0(H)  Hemoglobin 12.0 - 15.0 g/dL 7.6(L) 8.0(L) 7.1(L)  Hematocrit 36.0 - 46.0 % 24.2(L) 25.3(L) 23.5(L)  Platelets 150 - 400 K/uL 171 187 190    BMP Latest Ref Rng & Units 01/23/2022 01/22/2022 01/22/2022  Glucose 70 - 99 mg/dL 209(H)  243(H) 207(H)  BUN 8 - 23 mg/dL 16 16 16   Creatinine 0.44 - 1.00 mg/dL 1.30(H) 1.25(H) 1.23(H)  BUN/Creat Ratio 6 - 22 (calc) - - -  Sodium 135 - 145 mmol/L 135 133(L) 136  Potassium 3.5 - 5.1 mmol/L 4.0 4.4 4.3  Chloride 98 - 111 mmol/L 101 98 101  CO2 22 - 32 mmol/L 26 23 26   Calcium 8.9 - 10.3 mg/dL 9.3 9.4 9.4    ABG    Component Value Date/Time   PHART 7.425 01/20/2022 1944   PCO2ART 35.9 01/20/2022 1944   PO2ART 80 (L) 01/20/2022 1944   HCO3 23.6 01/20/2022 1944   TCO2 25 01/20/2022 1944   ACIDBASEDEF 1.0 01/20/2022 1944   O2SAT 58.4 01/23/2022 1255       Melodie Bouillon, MD 01/23/2022 4:48 PM

## 2022-01-23 NOTE — Progress Notes (Signed)
Modified Barium Swallow Progress Note  Patient Details  Name: Jody Nguyen MRN: [id number] Date of Birth: [birth year]  Today's Date: 01/23/2022  Modified Barium Swallow completed.  Full report located under Chart Review in the Imaging Section.  Brief recommendations include the following:  Clinical Impression  Pt presents with a mild oropharyngeal dysphagia. She has some lingual rocking and delayed oral transit. Mastication is a little effortful but she clears her oral cavity of solid foods well. She was not able to clear a barium tablet from her oral cavity with purees or nectar thick liquids, ultimately chewing it to get rid of it. However, this is a larger tablet that may be more challenging to swallow than some of her pills, including one in particular that per MD/RN report cannot be crushed. Pharyngeally she has mildly impaired timing that results in trace penetration to the vocal folds (PAS 5) with thin liquids. She was not observed to aspirate, but she also could not clear penetrates despite cues to cough/clear her throat. Attempted to use chin tuck but she physically and/or cognitively could not maintain this position. Also considering her mentation and that her positioning will not be as upright when she is on CRRT, would start with Dys 3 diet and nectar thick liquids. If offering pills that can't be crushed, would offer them whole in puree but would monitor for oral clearance.   Swallow Evaluation Recommendations       SLP Diet Recommendations: Dysphagia 3 (Mech soft) solids;Nectar thick liquid   Liquid Administration via: Cup;Straw   Medication Administration: Whole meds with puree (crush PRN)   Supervision: Staff to assist with self feeding;Full supervision/cueing for compensatory strategies   Compensations: Slow rate;Small sips/bites;Minimize environmental distractions   Postural Changes: Other (Comment) (as upright as possible while on CRRT)   Oral Care Recommendations:  Oral care BID   Other Recommendations: Prohibited food (jello, ice cream, thin soups);Remove water pitcher    [name]., M.A. [place] Pager [phone] Office [phone]  01/23/2022,5:13 PM

## 2022-01-23 NOTE — Progress Notes (Signed)
SLP Note:   SLP notified by RN that pt's filter has clotted. As per discussion with MD, will try to coordinate MBS. Radiology does have some availability so will plan to bring pt for MBS this afternoon.    [name]., M.A. [place] Acute Environmental education officer [phone] Office [phone]

## 2022-01-23 NOTE — Progress Notes (Signed)
[place] for bivalirudin Indication: history of pulmonary embolus 2021  Allergies  Allergen Reactions   Minoxidil Other (See Comments)    Pericardial effusion   Lipitor [Atorvastatin] Other (See Comments)    MYALGIAS > "pain in legs" Tolerates rosuvastatin   Morphine And Related Itching   Ace Inhibitors Other (See Comments)    REACTION: "not sure...think it made me drowsy all the time"    Patient Measurements: Height: 4' 11.5" (151.1 cm) Weight: 76.5 kg (168 lb 10.4 oz) IBW/kg (Calculated) : 44.35  Vital Signs: Temp: 97.6 F (36.4 C) (02/27 0800) Temp Source: Oral (02/27 0800) BP: 89/53 (02/27 0800) Pulse Rate: 84 (02/27 0800)  Labs: Recent Labs    01/21/22 0416 01/21/22 0917 01/21/22 1610 01/22/22 0442 01/22/22 0444 01/22/22 1556 01/23/22 0432  HGB 7.0* 7.1*  --  8.0*  --   --  7.6*  HCT 23.0* 23.5*  --  25.3*  --   --  24.2*  PLT 219 190  --  187  --   --  171  APTT 53*  --   --  52*  --   --  54*  CREATININE 1.28*  --    < >  --  1.23* 1.25* 1.30*   < > = values in this interval not displayed.     Estimated Creatinine Clearance: 38.4 mL/min (A) (by C-G formula based on SCr of 1.3 mg/dL (H)).   Assessment: 66 yo female on chronic apixaban for hx of PE 2021  Apixaban has been held for surgery.  Pharmacy asked to resume anticoagulation 2/15 with bivalirudin.  HIT antibody and SRA both negative.  Platelet count now recovered 50>208>171.  aPTT this morning therapeutic at 54 sec on bivalirudin 0.0135 mg/kg/hr. Hemoglobin low stable 7-8  No bleeding or complications currently noted.  Goal of Therapy:  aPTT goal ~50-70sec - target lower end given low platelet count/risk of post-operative bleeding Monitor platelets by anticoagulation protocol: Yes   Plan:  -Continue bival at 0.0135 mg/kg/hr -aPTT and CBC daily while on bival -Monitor for s/x of bleeding  [place] Crane, Vena Austria, BCPS, BCCP Clinical Pharmacist   01/23/2022 11:22 AM   [facility] pharmacy phone numbers are listed on [email or web address]

## 2022-01-23 NOTE — Progress Notes (Signed)
Physical Therapy Treatment Patient Details Name: Jody Nguyen MRN: [id number] DOB: [birth year] Today's Date: 01/23/2022   History of Present Illness 66 yo female presenting 2/5 with recurrent chest pain. Pt now s/p CABG x3 and tricuspid valve annuloplasty on 2/10 requiring 6 units PRBCs, 2 units FFP, and 2 bags of platelets and resulting in vasoplegic shock with very high vasopressor requirements. Started on CRRT 2/11, extubated 2/16. PMH includes: anemia, CHF, CKD IV on HD MWF, CVA, DM II, HLD, HTN, pulmonary HTN.    PT Comments    Pt alert and following commands this session; A&O to self and place, not time. Focus on therapeutic exercises for BUE/BLE ROM and strengthening. Pt tolerated tilt to 32 degrees for 10 minutes, VSS. Continue to be bed level due to femoral lines.   Recommendations for follow up therapy are one component of a multi-disciplinary discharge planning process, led by the attending physician.  Recommendations may be updated based on patient status, additional functional criteria and insurance authorization.  Follow Up Recommendations  Acute inpatient rehab (3hours/day)     Assistance Recommended at Discharge Frequent or constant Supervision/Assistance  Patient can return home with the following Two people to help with walking and/or transfers;Two people to help with bathing/dressing/bathroom;Assistance with cooking/housework;Help with stairs or ramp for entrance;Direct supervision/assist for medications management;Direct supervision/assist for financial management;Assist for transportation   Equipment Recommendations  Other (comment) (TBA)    Recommendations for Other Services       Precautions / Restrictions Precautions Precautions: Fall;Sternal;Other (comment) Precaution Comments: bilateral femoral catheters, flexiseal, cortrack, rectal tube, watch BP, CRRT Restrictions Weight Bearing Restrictions: Yes Other Position/Activity Restrictions: sternal precautions      Mobility  Bed Mobility Overal bed mobility: Needs Assistance             General bed mobility comments: Tilt 30* 10 minutes    Transfers                        Ambulation/Gait                   Stairs             Wheelchair Mobility    Modified Rankin (Stroke Patients Only)       Balance                                            Cognition Arousal/Alertness: Awake/alert Behavior During Therapy: Flat affect Overall Cognitive Status: Impaired/Different from baseline Area of Impairment: Orientation, Attention, Memory, Following commands, Awareness, Safety/judgement, Problem solving                 Orientation Level: Disoriented to, Time Current Attention Level: Focused, Sustained Memory: Decreased short-term memory Following Commands: Follows one step commands inconsistently, Follows one step commands with increased time Safety/Judgement: Decreased awareness of safety, Decreased awareness of deficits Awareness: Intellectual Problem Solving: Slow processing, Difficulty sequencing, Requires verbal cues General Comments: Pt stating month was March. Following all commands. Quiet        Exercises General Exercises - Upper Extremity Shoulder Flexion: PROM, Both, 10 reps, Supine Elbow Flexion: AROM, Both, 10 reps, Supine Digit Composite Flexion: AAROM, 10 reps, Supine, Right Composite Extension: AAROM, Right, 10 reps, Supine General Exercises - Lower Extremity Ankle Circles/Pumps: Both, 20 reps, Supine Quad Sets: Both, 5 reps, Other (comment) (in tilt bed) Heel  Slides: Both, 10 reps, Supine (< 30 degrees) Hip ABduction/ADduction: Both, 10 reps, Supine    General Comments  BP 118/58, HR 87      Pertinent Vitals/Pain Pain Assessment Pain Assessment: No/denies pain    Home Living                          Prior Function            PT Goals (current goals can now be found in the care plan  section) Acute Rehab PT Goals Patient Stated Goal: to get stronger Potential to Achieve Goals: Good    Frequency    Min 2X/week      PT Plan Current plan remains appropriate    Co-evaluation              AM-PAC PT "6 Clicks" Mobility   Outcome Measure  Help needed turning from your back to your side while in a flat bed without using bedrails?: Total Help needed moving from lying on your back to sitting on the side of a flat bed without using bedrails?: Total Help needed moving to and from a bed to a chair (including a wheelchair)?: Total Help needed standing up from a chair using your arms (e.g., wheelchair or bedside chair)?: Total Help needed to walk in hospital room?: Total Help needed climbing 3-5 steps with a railing? : Total 6 Click Score: 6    End of Session   Activity Tolerance: Patient tolerated treatment well Patient left: in bed;with call bell/phone within reach Nurse Communication: Mobility status PT Visit Diagnosis: Unsteadiness on feet (R26.81);Other abnormalities of gait and mobility (R26.89);Muscle weakness (generalized) (M62.81)     Time: 9357-0177 PT Time Calculation (min) (ACUTE ONLY): 38 min  Charges:  $Therapeutic Exercise: 23-37 mins $Therapeutic Activity: 8-22 mins                     Wyona Almas, PT, DPT Acute Rehabilitation Services Pager [phone] Office [phone]    [name] 01/23/2022, 1:13 PM

## 2022-01-23 NOTE — Progress Notes (Signed)
NAME:  Jody Nguyen, MRN:  [id number], DOB:  1956/01/15, LOS: 66 ADMISSION DATE:  01/12/2022, CONSULTATION DATE:  01/23/2022 REFERRING MD:  Marlowe Sax - TRH, CHIEF COMPLAINT:  SOB   History of Present Illness:  66 year old woman who presented to [facility] ED 2/4 with CP radiating to her arm. PMHx significant for  ESRD (on HD), non-obstructive CAD, suspected pulmonary hypertension, PE (on Eliquis). Recent ED presentation 2/1 for similar symptoms (troponin 100s). F/u with Cardiology as an outpatient 2/2 and CP was felt to be related to volume shift (occurring during dialysis).   On ED presentation 2/4, patient reported episodic chest pain, non-exertional and intermittent x 1 week with associated nausea and SOB. Non-specific EKG changes. Cardiology consulted, STEMI ruled out. Troponin elevated at 200. Placed on [place] for SpO2 80s. Admitted to [facility] for observation with concern for PE and started on heparin gtt. VQ with low probability for PE. Echo 2/19 demonstrated severe RV/RA enlargement, severely reduced RV function and septal bowing; felt to be consistent with chronic process such as severe pulmonary HTN.    PCCM has been consulted in this setting.  Pertinent Medical History:  ESRD PE Pulmonary HTN  CAD CVA HLD HTN S/p MV repair  Pericardial effusion r/t minoxidil  Diastolic HF DM2  Significant Hospital Events: Including procedures, antibiotic start and stop dates in addition to other pertinent events   2/4 ED presentation with recurring chest pain -- STEMI ruled out. Stable, mildly hypoxic and placed on [place] 2/5 Admit to [facility] for obvs. Concern for PE, ECHO performed by cardiology reportedly shows RV failure, but felt more consistent with chronic process like severe pulmHTN. Awaiting VQ scan. Pulm consulted  2/7 Cardiac cath. 3 vessel dz. CABG rec 2/10 CABG, TVR 2/11 Continued vasoplegic shock with very high vasopressor requirements  2/12 Given methylene blue x 2 2/13 Chest tube removed 2/14 JP  removed 2/15 pacing wires removed 2/16 milrinone stopped, extubated 2/17 Zosyn for GNR bacteremia-> Morganella morganii 2/18 Arterial line, central line, vascath all removed and replaced. 2/19 Milrinone back on. Co-ox improved. ECHO Left ventricular ejection fraction, by estimation, is 50 to 55%. The left ventricle has low normal function. No regional WM abnormality. Right Ventricle: Ventricular septum is flattened in systole and diastole  suggesting RV pressure and volume overload. The right ventricular size is  severely enlarged. Right vetricular wall thickness was not well  visualized. Right ventricular systolic  function is severely reduced. There is moderately elevated pulmonary  artery systolic pressure 8/46 still high pressor needs. CRRT stopped. Having intermittent bouts of tachycardia 2/21 Hemodynamics better. Weaning Epi. Still on Norepi and inotropic support. Supported on BIPAP over night and tolerated well. On am rounds more lethargic w/ generalized pain everywhere. Co-ox > 70. Glucose poorly controlled. 2/22 Escalating pressor requirements (NE, Epi, Vaso). Continues on milrinone. Afib with RVR overnight, now with persistent AF (on amio, bival). Co-ox 73%. Hgb 7.3. WBC uptrending (25 from 20), Cr uptrending, CRRT restarted. Tolerating BiPAP. 2/23 Improving pressor needs. Stable Epi/Vaso, downtitrating NE. Milrinone 0.25. Persistent AF, rate controlled. Co-ox 65.7%. Ongoing CRRT. BiPAP QHS. 2/24 Continued decreasing pressor needs, stable epi/vaso, downtitrating Levo.  Milrinone 0.25.  Intermittent A-fib, in and out of NSR, rates 90s.  Co-ox 76.1%.  Ongoing CRRT, did not utilize BiPAP overnight. FEES with SLP. 2/25 confused.  Pressor requirements down some.  Got 1 unit of blood for hemoglobin of 7, Co. ox dropped. Vasopressin decreased to 0.03 units.   Interim History / Subjective:  She denies complaints  today. Husband at bedside. Has been needing CRRT filter changes about once per  shift.  Objective:  Blood pressure (!) 89/53, pulse 84, temperature 97.6 F (36.4 C), temperature source Axillary, resp. rate 14, height 4' 11.5" (1.511 m), weight 76.5 kg, SpO2 100 %. CVP:  [9 mmHg-19 mmHg] 19 mmHg      Intake/Output Summary (Last 24 hours) at 01/23/2022 1616 Last data filed at 01/23/2022 1400 Gross per 24 hour  Intake 2429.57 ml  Output 3686 ml  Net -1256.43 ml    Filed Weights   01/21/22 0500 01/22/22 0700 01/23/22 0500  Weight: 66.9 kg (S) 76.8 kg 76.5 kg   Physical Examination: General: Ill-appearing woman lying in bed in no acute distress on CRRT  HEENT: [place]/AT, eyes anicteric Pulmonary: Breathing comfortably on nasal cannula, no tachypnea or accessory muscle use.  CTAB. Cardiac: Regular rate and rhythm, S1-S2 Abdomen soft, nontender, nondistended Extremities: Ongoing edema, no cyanosis or clubbing Neuro: Awake, slow to answer questions, but responsive Derm: Warm, no diffuse rashes  Echo 2/19> LVEF 50-55%, RV volume pressure overload, septal flattening and systole and diastole, severely reduced RV systolic function and severely enlarged. Severe TR. Dilated IVC with reduced respiratory variability.  [facility] records reviewed>   RHC 04/2019: RA 10, PA 65/31 (43), PCWP 18 (v-20), BP 187/110, PVR 5, CI 2.83 RHC 09/2019 (done on a non HD day) PA 55/25 (36), PCWP 18, BP 152/93 (113), PA sat 81, PVR 3.96 & 3.52, CI 2.69 by Fick and 3.02 by Thermo  09/06/2021 RHC: RA 8, RV 52/3 (8), PA 50/23 (32) PCWP 21  Fick: CO 5.8, CI 3.2 Thermodilution: CO 3.97, CI 2.33  Assessment & Plan:  Principal Problem:   Chest pain, Multivessel CAD Active Problems:   Hyperlipidemia associated with type 2 diabetes mellitus (HCC)   Anemia of chronic renal failure   Type 2 diabetes mellitus (HCC)   Obesity (BMI 30-39.9)   Pulmonary hypertension (HCC)   Chronic combined systolic and diastolic heart failure (HCC)   Atrial fibrillation (HCC)   End stage renal disease on dialysis (HCC)    History of stroke   QT prolongation   SVT (supraventricular tachycardia) (HCC)   Coronary artery disease involving native coronary artery of native heart with unstable angina pectoris (HCC)   Hyponatremia   GERD (gastroesophageal reflux disease)   Septic shock (HCC)   Gram-negative bacteremia   Hx of CABG   S/P TVR (tricuspid valve repair)   Cardiogenic shock (HCC)   Tachycardia   S/P CABG (coronary artery bypass graft)   Metabolic encephalopathy   Acute hypoxemic respiratory failure (HCC)  CAD with TR, s/p CABG x 3 and tricuspid repair 2/10 Acute-on-chronic HFrEF, acute right heart failure> has had some recovery of LV function, but RV function is severely reduced.  Despite controlled oxygenation, I am concerned that her cardiac output is limited by her RV failure due to pulmonary hypertension that remains uncontrolled off of her maintenance medications Etiology of PH unknown; PER [facility] records-- has WHO group II and I (2/2 ESRD). 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 PFTs with chronic restriction potentially related to obesity. --Restart macitentan.  Prefer this to inhaled EPO. --Con't AC. - Continue to hold sildenafil. - Consult cardiology.  Ideally would have Swan catheter to monitor hemodynamics more closely with reinitiation of PAH therapy.  This may not be feasible with her limited vascular  access. -Not a candidate for guideline directed medical therapy or beta-blocker at this point due to ongoing hypotension -Daily aspirin and statin  Shock-concern that this is mostly cardiogenic at this point.  Lines have been changed out and she has remained on broad-spectrum antibiotics, making septic shock less likely due to ongoing persistent source of her shock.  Vasoplegia should have been resolved by now. - Resume macitentan - Continue vaso, epi, norepi, milrinone -Continue  broad-spectrum antibiotics given lack of obvious clinical resolution of infection  Afib, persistent; RVR resolved.  Back in sinus rhythm today. Afib with RVR noted 2/21PM with rates into 150s. -Continue amiodarone.  Will need IV until transitioning off positive inotropes and chronotropes. -Telemetry monitoring - Continue to monitor and replete electrolytes as needed -Bivalirudin for anticoagulation  Acute respiratory failure with hypoxia, suspected component of PAH.  Most recent chest x-ray on 2/25 not commencing of-supplemental oxygen to maintain SPO2 greater than 90%. - Resume macitentan today. Holding sildenfil. - supplemental O2 to maintain SpO2> 90%  ESRD on HD Thrombosed AVF -Con't CRRT, appreciate nephrology's management -eventually will need tunneled dialysis catheter-- should ideally be more stable for this  Acute metabolic encephalopathy- likely ICU delirium vs less likely sepsis.  -delirium precautions -con't RRT  DM2 w/ hyperglycemia, uncontrolled Con't Levemir 20 units bid; may need to increase if still needing TF with liberalized diet -Resistant SSI PRN -con't TF coverage; may need to switch to mealtime coverage -goal BG 140-180  Anemia of critical illness and renal failure, losing blood frequently in CRRT filters -transfuse for Hb<7 or hemodynamically significant bleeding -EPO per nephro  Thrombocytopenia - suspect CRRT related -monitor  dysphagia At risk for malnutrition -appreciate SLP -able to do MBSS while off CRRT due to clotting issue today -cleared for dysphagia III and nectar thick liquids today; monitor closely, especially with positioning remaining difficult due to femoral catheters    Best Practice: (right click and "Reselect all SmartList Selections" daily)   Diet/type: tubefeeds  , dysphagia 3 DVT prophylaxis: other- bivalirudin GI prophylaxis: PPI Lines: Central line, Dialysis Catheter, Arterial Line, and yes and it is still needed  Foley:   N/A Code Status:  full code Last date of multidisciplinary goals of care discussion [Family updated 2/27 at bedside]    This patient is critically ill with multiple organ system failure which requires frequent high complexity decision making, assessment, support, evaluation, and titration of therapies. This was completed through the application of advanced monitoring technologies and extensive interpretation of multiple databases. During this encounter critical care time was devoted to patient care services described in this note for 55 minutes.  Julian Hy, DO 01/23/22 7:34 PM [facility]

## 2022-01-24 ENCOUNTER — Inpatient Hospital Stay (HOSPITAL_COMMUNITY): Payer: Medicare Other

## 2022-01-24 ENCOUNTER — Telehealth: Payer: Medicare Other | Admitting: Pharmacist

## 2022-01-24 DIAGNOSIS — E1122 Type 2 diabetes mellitus with diabetic chronic kidney disease: Secondary | ICD-10-CM | POA: Diagnosis not present

## 2022-01-24 DIAGNOSIS — R579 Shock, unspecified: Secondary | ICD-10-CM | POA: Diagnosis not present

## 2022-01-24 DIAGNOSIS — I10 Essential (primary) hypertension: Secondary | ICD-10-CM | POA: Diagnosis not present

## 2022-01-24 DIAGNOSIS — R570 Cardiogenic shock: Secondary | ICD-10-CM | POA: Diagnosis not present

## 2022-01-24 DIAGNOSIS — I480 Paroxysmal atrial fibrillation: Secondary | ICD-10-CM | POA: Diagnosis not present

## 2022-01-24 DIAGNOSIS — E1169 Type 2 diabetes mellitus with other specified complication: Secondary | ICD-10-CM | POA: Diagnosis not present

## 2022-01-24 DIAGNOSIS — R072 Precordial pain: Secondary | ICD-10-CM | POA: Diagnosis not present

## 2022-01-24 DIAGNOSIS — E785 Hyperlipidemia, unspecified: Secondary | ICD-10-CM | POA: Diagnosis not present

## 2022-01-24 DIAGNOSIS — E1129 Type 2 diabetes mellitus with other diabetic kidney complication: Secondary | ICD-10-CM | POA: Diagnosis not present

## 2022-01-24 DIAGNOSIS — N186 End stage renal disease: Secondary | ICD-10-CM | POA: Diagnosis not present

## 2022-01-24 DIAGNOSIS — Z992 Dependence on renal dialysis: Secondary | ICD-10-CM | POA: Diagnosis not present

## 2022-01-24 LAB — CBC WITH DIFFERENTIAL/PLATELET
Abs Immature Granulocytes: 0.11 10*3/uL — ABNORMAL HIGH (ref 0.00–0.07)
Basophils Absolute: 0.1 10*3/uL (ref 0.0–0.1)
Basophils Relative: 1 %
Eosinophils Absolute: 0.6 10*3/uL — ABNORMAL HIGH (ref 0.0–0.5)
Eosinophils Relative: 4 %
HCT: 23.8 % — ABNORMAL LOW (ref 36.0–46.0)
Hemoglobin: 7.4 g/dL — ABNORMAL LOW (ref 12.0–15.0)
Immature Granulocytes: 1 %
Lymphocytes Relative: 7 %
Lymphs Abs: 1.1 10*3/uL (ref 0.7–4.0)
MCH: 30 pg (ref 26.0–34.0)
MCHC: 31.1 g/dL (ref 30.0–36.0)
MCV: 96.4 fL (ref 80.0–100.0)
Monocytes Absolute: 1.2 10*3/uL — ABNORMAL HIGH (ref 0.1–1.0)
Monocytes Relative: 8 %
Neutro Abs: 11.9 10*3/uL — ABNORMAL HIGH (ref 1.7–7.7)
Neutrophils Relative %: 79 %
Platelets: 163 10*3/uL (ref 150–400)
RBC: 2.47 MIL/uL — ABNORMAL LOW (ref 3.87–5.11)
RDW: 24.7 % — ABNORMAL HIGH (ref 11.5–15.5)
WBC: 15 10*3/uL — ABNORMAL HIGH (ref 4.0–10.5)
nRBC: 10.3 % — ABNORMAL HIGH (ref 0.0–0.2)

## 2022-01-24 LAB — RENAL FUNCTION PANEL
Albumin: 2.4 g/dL — ABNORMAL LOW (ref 3.5–5.0)
Albumin: 2.5 g/dL — ABNORMAL LOW (ref 3.5–5.0)
Anion gap: 8 (ref 5–15)
Anion gap: 7 (ref 5–15)
BUN: 16 mg/dL (ref 8–23)
BUN: 14 mg/dL (ref 8–23)
CO2: 24 mmol/L (ref 22–32)
CO2: 26 mmol/L (ref 22–32)
Calcium: 9.3 mg/dL (ref 8.9–10.3)
Calcium: 9.5 mg/dL (ref 8.9–10.3)
Chloride: 102 mmol/L (ref 98–111)
Chloride: 102 mmol/L (ref 98–111)
Creatinine, Ser: 1.32 mg/dL — ABNORMAL HIGH (ref 0.44–1.00)
Creatinine, Ser: 1.18 mg/dL — ABNORMAL HIGH (ref 0.44–1.00)
GFR, Estimated: 45 mL/min — ABNORMAL LOW (ref >60)
GFR, Estimated: 51 mL/min — ABNORMAL LOW (ref >60)
Glucose, Bld: 168 mg/dL — ABNORMAL HIGH (ref 70–99)
Glucose, Bld: 140 mg/dL — ABNORMAL HIGH (ref 70–99)
Phosphorus: 2.3 mg/dL — ABNORMAL LOW (ref 2.5–4.6)
Phosphorus: 2.3 mg/dL — ABNORMAL LOW (ref 2.5–4.6)
Potassium: 4.2 mmol/L (ref 3.5–5.1)
Potassium: 4.2 mmol/L (ref 3.5–5.1)
Sodium: 134 mmol/L — ABNORMAL LOW (ref 135–145)
Sodium: 135 mmol/L (ref 135–145)

## 2022-01-24 LAB — GLUCOSE, CAPILLARY
Glucose-Capillary: 147 mg/dL — ABNORMAL HIGH (ref 70–99)
Glucose-Capillary: 111 mg/dL — ABNORMAL HIGH (ref 70–99)
Glucose-Capillary: 110 mg/dL — ABNORMAL HIGH (ref 70–99)
Glucose-Capillary: 124 mg/dL — ABNORMAL HIGH (ref 70–99)
Glucose-Capillary: 109 mg/dL — ABNORMAL HIGH (ref 70–99)
Glucose-Capillary: 124 mg/dL — ABNORMAL HIGH (ref 70–99)

## 2022-01-24 LAB — COOXEMETRY PANEL
Carboxyhemoglobin: 2.2 % — ABNORMAL HIGH (ref 0.5–1.5)
Methemoglobin: <0.7 % (ref 0.0–1.5)
O2 Saturation: 72.4 %
Total hemoglobin: 7.5 g/dL — ABNORMAL LOW (ref 12.0–16.0)

## 2022-01-24 LAB — APTT: aPTT: 46 seconds — ABNORMAL HIGH (ref 24–36)

## 2022-01-24 LAB — MAGNESIUM: Magnesium: 2.5 mg/dL — ABNORMAL HIGH (ref 1.7–2.4)

## 2022-01-24 MED ORDER — POTASSIUM & SODIUM PHOSPHATES 280-160-250 MG PO PACK
2.0000 | PACK | Freq: Once | ORAL | Status: AC
Start: 2022-01-24 — End: 2022-01-24
  Administered 2022-01-24: 2 via ORAL
  Filled 2022-01-24: qty 2

## 2022-01-24 MED ORDER — SODIUM CHLORIDE 0.9 % IV SOLN: INTRAVENOUS | Status: DC

## 2022-01-24 MED ORDER — INSULIN ASPART 100 UNIT/ML IJ SOLN
8.0000 [IU] | INTRAMUSCULAR | Status: AC
  Administered 2022-01-24 (×3): 8 [IU] via SUBCUTANEOUS

## 2022-01-24 MED ORDER — SODIUM CHLORIDE 0.9% FLUSH: 3.0000 mL | Freq: Two times a day (BID) | INTRAVENOUS | Status: DC

## 2022-01-24 MED ORDER — SODIUM CHLORIDE 0.9 % IV SOLN: 250.0000 mL | INTRAVENOUS | Status: DC | PRN

## 2022-01-24 MED ORDER — SODIUM CHLORIDE 0.9% FLUSH: 3.0000 mL | INTRAVENOUS | Status: DC | PRN

## 2022-01-24 NOTE — Progress Notes (Signed)
Occupational Therapy Treatment Patient Details Name: Jody Nguyen MRN: [id number] DOB: Jun 04, 1956 Today's Date: 01/24/2022   History of present illness 66 yo female presenting 2/5 with recurrent chest pain. Pt now s/p CABG x3 and tricuspid valve annuloplasty on 2/10 requiring 6 units PRBCs, 2 units FFP, and 2 bags of platelets and resulting in vasoplegic shock with very high vasopressor requirements. Started on CRRT 2/11, extubated 2/16. PMH includes: anemia, CHF, CKD IV on HD MWF, CVA, DM II, HLD, HTN, pulmonary HTN.   OT comments  Upon arrival, pt supine in bed with husband at bedside. Pt more awake and eager to participate in therapy. Pt performing BUE AROM exercises first in supine. Noting RUE continued to be very swollen but pt activity moving it more. Pt tolerating 20 minutes of semi standing with kreg bed at 30*, 40*, and 50*. VSS and BP stable on RA. Pt participating in BUE, BLE, and cervical PROM exercises. Pt also making her lunch, dinner, and breakfast order while in standing (50*) with food services. Continue to recommend dc to CIR and will continue to follow acutely as admitted.   Recommendations for follow up therapy are one component of a multi-disciplinary discharge planning process, led by the attending physician.  Recommendations may be updated based on patient status, additional functional criteria and insurance authorization.    Follow Up Recommendations  Acute inpatient rehab (3hours/day)    Assistance Recommended at Discharge Frequent or constant Supervision/Assistance  Patient can return home with the following  Two people to help with walking and/or transfers;Two people to help with bathing/dressing/bathroom   Equipment Recommendations  Other (comment) (Defer to next venue)    Recommendations for Other Services PT consult    Precautions / Restrictions Precautions Precautions: Fall;Sternal;Other (comment) Precaution Comments: bilateral femoral catheters,  flexiseal, cortrack, rectal tube, watch BP, CRRT Restrictions Other Position/Activity Restrictions: sternal precautions       Mobility Bed Mobility               General bed mobility comments: Tilt 30*, 40*, and 50* 10 minutes    Transfers                   General transfer comment: defer due to bil fem catheters     Balance                                           ADL either performed or assessed with clinical judgement   ADL Overall ADL's : Needs assistance/impaired     Grooming: Wash/dry face;Min guard;Standing Grooming Details (indicate cue type and reason): Pt washing her face while in semi standing at 45* in kreg bed tilting position. Able to bring LUE to face. Fatigues quickly                               General ADL Comments: Engaging in tilting at 30*, 40*, and 50*. Tolerating for 20 minutes. particiapting in BUE, BLE, and cervical ROM exercises    Extremity/Trunk Assessment Upper Extremity Assessment Upper Extremity Assessment: RUE deficits/detail;LUE deficits/detail RUE Deficits / Details: significant edema (R > L) with poor grip strength,able to lift RUE off the bed RUE Sensation: WNL RUE Coordination: decreased fine motor;decreased gross motor LUE Deficits / Details: significant edema (R > L) with poor grip strength, able to bring hand to  face with increased effort LUE Coordination: decreased fine motor;decreased gross motor   Lower Extremity Assessment Lower Extremity Assessment: Defer to PT evaluation RLE Deficits / Details: pt able to complete small ROM at ankle, knee, and hip but limited in hip flexion due to fem lines. RLE Sensation: WNL RLE Coordination: decreased gross motor LLE Deficits / Details: pt able to complete small ROM at ankle, knee, and hip but limited in hip flexion due to fem lines. LLE Sensation: WNL LLE Coordination: decreased gross motor        Vision       Perception     Praxis       Cognition Arousal/Alertness: Awake/alert Behavior During Therapy: Flat affect Overall Cognitive Status: Impaired/Different from baseline Area of Impairment: Attention, Memory, Following commands, Awareness, Safety/judgement, Problem solving                   Current Attention Level: Sustained (short sustained; very distracted by environment) Memory: Decreased short-term memory Following Commands: Follows one step commands inconsistently, Follows one step commands with increased time Safety/Judgement: Decreased awareness of safety, Decreased awareness of deficits Awareness: Intellectual Problem Solving: Slow processing, Difficulty sequencing, Requires verbal cues General Comments: Increased arousal and engagement. Following simple commands to participate in ROM exercises - requiring additional visual cues. Distracted by environmental stimuli but able to be redirected back to task. Requiring inceased time for processing. While performing tilting (~50*) pt able to order her lunch, dinner, and breakfast - greatly fatigued after this task and requiting increased cues by the time she was asked to order breakfast.        Exercises Exercises: General Upper Extremity, General Lower Extremity, Other exercises General Exercises - Upper Extremity Shoulder Flexion: Both, AROM, 5 reps, Supine Elbow Flexion: AROM, Both, 10 reps, Supine Wrist Flexion: Both, 10 reps, AROM, Standing Wrist Extension: Both, 10 reps, AROM, Standing Digit Composite Flexion: 10 reps, AROM, Both, Standing Composite Extension: 10 reps, AROM, Both, Standing General Exercises - Lower Extremity Quad Sets: Both, Other (comment), 10 reps, Standing (in tilt bed) Gluteal Sets: AROM, Both, 10 reps, Standing Other Exercises Other Exercises: cervical AROM tilting at 45*; 5x    Shoulder Instructions       General Comments VSS on RA and BP checked throughout    Pertinent Vitals/ Pain       Pain Assessment Pain  Assessment: Faces Faces Pain Scale: Hurts little more Pain Location: Generlized with movement Pain Descriptors / Indicators: Discomfort Pain Intervention(s): Monitored during session, Limited activity within patient's tolerance, Repositioned  Home Living                                          Prior Functioning/Environment              Frequency  Min 2X/week        Progress Toward Goals  OT Goals(current goals can now be found in the care plan section)  Progress towards OT goals: Progressing toward goals  Acute Rehab OT Goals OT Goal Formulation: With patient Time For Goal Achievement: 02/02/22 Potential to Achieve Goals: Good ADL Goals Pt Will Perform Grooming: with min assist;sitting (semi-standing at kreg bed) Additional ADL Goal #1: Pt will tolerate 20 minutes of semi standing position with kreg bed tilted at 45* in preparation for ADLs Additional ADL Goal #2: Pt will demonstrate selective attention during ADLs with Min cues  Plan Discharge plan remains appropriate    Co-evaluation                 AM-PAC OT "6 Clicks" Daily Activity     Outcome Measure   Help from another person eating meals?: Total Help from another person taking care of personal grooming?: A Lot Help from another person toileting, which includes using toliet, bedpan, or urinal?: Total Help from another person bathing (including washing, rinsing, drying)?: Total Help from another person to put on and taking off regular upper body clothing?: Total Help from another person to put on and taking off regular lower body clothing?: Total 6 Click Score: 7    End of Session    OT Visit Diagnosis: Other abnormalities of gait and mobility (R26.89);Unsteadiness on feet (R26.81);Muscle weakness (generalized) (M62.81)   Activity Tolerance Patient tolerated treatment well   Patient Left in bed;with call bell/phone within reach;with nursing/sitter in room;with family/visitor  present   Nurse Communication Mobility status        Time: 3953-2023 OT Time Calculation (min): 46 min  Charges: OT General Charges $OT Visit: 1 Visit OT Treatments $Therapeutic Activity: 8-22 mins $Therapeutic Exercise: 23-37 mins  [place], OTR/L Acute Rehab Pager: [phone] Office: [place] 01/24/2022, 3:57 PM

## 2022-01-24 NOTE — Progress Notes (Signed)
[place] for bivalirudin Indication: history of pulmonary embolus 2021  Allergies  Allergen Reactions   Minoxidil Other (See Comments)    Pericardial effusion   Lipitor [Atorvastatin] Other (See Comments)    MYALGIAS > "pain in legs" Tolerates rosuvastatin   Morphine And Related Itching   Ace Inhibitors Other (See Comments)    REACTION: "not sure...think it made me drowsy all the time"    Patient Measurements: Height: 4' 11.5" (151.1 cm) Weight: 72 kg (158 lb 11.7 oz) IBW/kg (Calculated) : 44.35  Vital Signs: Temp: 97.7 F (36.5 C) (02/28 0751) Temp Source: Oral (02/28 0751) BP: 89/51 (02/28 0600) Pulse Rate: 99 (02/28 0700)  Labs: Recent Labs    01/22/22 0442 01/22/22 0444 01/23/22 0432 01/23/22 1730 01/24/22 0354  HGB 8.0*  --  7.6*  --  7.4*  HCT 25.3*  --  24.2*  --  23.8*  PLT 187  --  171  --  163  APTT 52*  --  54*  --  46*  CREATININE  --    < > 1.30* 1.46* 1.32*   < > = values in this interval not displayed.     Estimated Creatinine Clearance: 36.7 mL/min (A) (by C-G formula based on SCr of 1.32 mg/dL (H)).   Assessment: 66 yo female on chronic apixaban for hx of PE 2021. Apixaban has been held for surgery.  Pharmacy asked to resume anticoagulation 2/15 with bivalirudin.  HIT antibody and SRA both negative.  Platelet count now recovered 50>208>171.  aPTT this morning is slightly below goal at 46 seconds. Pltc continues to improve, H/H low but stable. Per RN, IV site started bleeding overnight requiring transfer of IV right around time aPTT was drawn so will not make adjustments.  Goal of Therapy:  aPTT goal ~50-70sec - target lower end given low platelet count/risk of post-operative bleeding Monitor platelets by anticoagulation protocol: Yes   Plan:  -Continue bival at 0.0135 mg/kg/hr -aPTT and CBC daily while on bival -Monitor for s/x of bleeding  Arrie Senate, PharmD, BCPS, [facility] Clinical  Pharmacist [phone] Please check AMION for all [facility] numbers 01/24/2022

## 2022-01-24 NOTE — Progress Notes (Signed)
° °   °  [address]       [place] [address]             [phone]      Asleep  BP (!) 76/50 (BP Location: Left Arm)    Pulse 89    Temp 97.8 F (36.6 C) (Oral)    Resp 19    Ht 4' 11.5" (1.511 m)    Wt 72 kg    SpO2 100%    BMI 31.52 kg/m  BP currently 112/65 Epi @ 2, off norepi, vasopressin 0.03, milrinone 0.25  Intake/Output Summary (Last 24 hours) at 01/24/2022 1855 Last data filed at 01/24/2022 1800 Gross per 24 hour  Intake 2800.84 ml  Output 4716 ml  Net -1915.16 ml   Continue current Rx  Damario Gillie C. Roxan Hockey, MD [facility] [phone]

## 2022-01-24 NOTE — Progress Notes (Signed)
SLP Cancellation Note  Patient Details Name: MAKAILAH SLAVICK MRN: [id number] DOB: 25-Jun-1956   Cancelled treatment:       Reason Eval/Treat Not Completed: Medical issues which prohibited therapy. Per chart, pt is NPO pending procedure. Will f/u as able once she is able to resume POs.    [name]., M.A. [place] [facility] Pager [phone] Office [phone]  01/24/2022, 8:46 AM

## 2022-01-24 NOTE — Progress Notes (Addendum)
Advanced Heart Failure Rounding Note  PCP-Cardiologist: [name], MD   Subjective:   2/10 S/P CABG PFO closure and TV ring  with ESRD.   Norepi 4 mcg Epi  mcg 3 Vaso 0.3 Milrinone 0.25 mcg . CVP 21-22   Continue amio drip.   On CVVHD pulling 50 per hour.   CO-OX 72%.   Complaining of shortness of breath.    Objective:   Weight Range: 72 kg Body mass index is 31.52 kg/m.   Vital Signs:   Temp:  [97.2 F (36.2 C)-98.2 F (36.8 C)] 97.7 F (36.5 C) (02/28 0751) Pulse Rate:  [75-107] 99 (02/28 0700) Resp:  [3-25] 21 (02/28 0700) BP: (89-107)/(48-72) 89/51 (02/28 0600) SpO2:  [86 %-100 %] 100 % (02/28 0700) Arterial Line BP: (95-121)/(41-57) 118/54 (02/28 0700) Weight:  [72 kg] 72 kg (02/28 0500) Last BM Date : 01/23/22  Weight change: Filed Weights   01/22/22 0700 01/23/22 0500 01/24/22 0500  Weight: (S) 76.8 kg 76.5 kg 72 kg    Intake/Output:   Intake/Output Summary (Last 24 hours) at 01/24/2022 0756 Last data filed at 01/24/2022 0700 Gross per 24 hour  Intake 2649 ml  Output 3867 ml  Net -1218 ml      Physical Exam   CVP 21-22  General:  On CVVHD . Dyspneic at rest.  HEENT: + [place] Neck: Supple. JVP to jaw  Carotids 2+ bilat; no bruits. No lymphadenopathy or thyromegaly appreciated. Cor: PMI nondisplaced. Regular rate & rhythm. No rubs, gallops or murmurs. Lungs: Clear on 2 liters  Abdomen: Soft, nontender, nondistended. No hepatosplenomegaly. No bruits or masses. Good bowel sounds. Extremities: No cyanosis, clubbing, rash, edema. L fem vein central line. L fem art line. RUE  AVF Neuro: Alert & orientedx3, cranial nerves grossly intact. moves all 4 extremities w/o difficulty. Affect pleasant   Telemetry   SR/ST   EKG    N/A   Labs    CBC Recent Labs    01/23/22 0432 01/24/22 0354  WBC 18.0* 15.0*  NEUTROABS 15.0* 11.9*  HGB 7.6* 7.4*  HCT 24.2* 23.8*  MCV 96.0 96.4  PLT 171 773   Basic Metabolic Panel Recent Labs     01/23/22 0432 01/23/22 1730 01/24/22 0354  NA 135 135 134*  K 4.0 4.5 4.2  CL 101 98 102  CO2 _0 GLUCOSE 209* 227* 168*  BUN _1 CREATININE 1.30* 1.46* 1.32*  CALCIUM 9.3 9.7 9.3  MG 2.4  --  2.5*  PHOS 3.1 3.3 2.3*   Liver Function Tests Recent Labs    01/23/22 0432 01/23/22 1730 01/24/22 0354  AST 99*  --   --   ALT 78*  --   --   ALKPHOS 111  --   --   BILITOT 0.6  --   --   PROT 6.4*  --   --   ALBUMIN 2.2*   2.4* 2.4* 2.4*   No results for input(s): LIPASE, AMYLASE in the last 72 hours. Cardiac Enzymes No results for input(s): CKTOTAL, CKMB, CKMBINDEX, TROPONINI in the last 72 hours.  BNP: BNP (last 3 results) Recent Labs    12/20/21 2246 01/01/22 0941  BNP 1,753.1* 1,432.4*    ProBNP (last 3 results) No results for input(s): PROBNP in the last 8760 hours.   D-Dimer No results for input(s): DDIMER in the last 72 hours. Hemoglobin A1C No results for input(s): HGBA1C in the last 72 hours. Fasting Lipid Panel No  results for input(s): CHOL, HDL, LDLCALC, TRIG, CHOLHDL, LDLDIRECT in the last 72 hours. Thyroid Function Tests No results for input(s): TSH, T4TOTAL, T3FREE, THYROIDAB in the last 72 hours.  Invalid input(s): FREET3  Other results:   Imaging    CT SOFT TISSUE NECK WO CONTRAST  Result Date: 01/23/2022 CLINICAL DATA:  Soft tissue swelling EXAM: CT NECK WITHOUT CONTRAST TECHNIQUE: Multidetector CT imaging of the neck was performed following the standard protocol without intravenous contrast. RADIATION DOSE REDUCTION: This exam was performed according to the departmental dose-optimization program which includes automated exposure control, adjustment of the mA and/or kV according to patient size and/or use of iterative reconstruction technique. COMPARISON:  None. FINDINGS: PHARYNX AND LARYNX: The nasopharynx, oropharynx and larynx are normal. Visible portions of the oral cavity, tongue base and floor of mouth are normal. Normal  epiglottis, vallecula and pyriform sinuses. The larynx is normal. No retropharyngeal abscess, effusion or lymphadenopathy. SALIVARY GLANDS: Normal parotid, submandibular and sublingual glands. THYROID: Normal. LYMPH NODES: No enlarged or abnormal density lymph nodes. VASCULAR: Major cervical vessels are patent. LIMITED INTRACRANIAL: Normal. VISUALIZED ORBITS: Normal. MASTOIDS AND VISUALIZED PARANASAL SINUSES: Bilateral mastoid fluid. Visualized paranasal sinuses are unremarkable. SKELETON: No bony spinal canal stenosis. No lytic or blastic lesions. UPPER CHEST: Small left pleural effusion OTHER: Nasogastric tube courses below the field of view IMPRESSION: 1. No acute abnormality of the neck. 2. Small left pleural effusion. Electronically Signed   By: Ulyses Jarred M.D.   On: 01/23/2022 21:00   DG Swallowing Func-Speech Pathology  Result Date: 01/23/2022 Table formatting from the original result was not included. Objective Swallowing Evaluation: Type of Study: MBS-Modified Barium Swallow Study  Patient Details Name: LATEISHA THURLOW MRN: [id number] Date of Birth: [birth year] Today's Date: 01/23/2022 Time: SLP Start Time (ACUTE ONLY): 1609 -SLP Stop Time (ACUTE ONLY): 1628 SLP Time Calculation (min) (ACUTE ONLY): 19 min Past Medical History: Past Medical History: Diagnosis Date  Anemia   Chronic diastolic CHF (congestive heart failure) (HCC)   Chronic diastolic heart failure (HCC)   CKD (chronic kidney disease) stage 4, GFR 15-29 ml/min (HCC)   dialysis M/W/F  Constipation   CVA (cerebral infarction) 1997  no residual deficit  Diverticulitis   DM (diabetes mellitus) ([place])   type 2  Heart murmur   History of cardiovascular stress test   a. Myoview Oct 2012 showed EF 49%, no ischemia, LVE  HLD (hyperlipidemia)   takes Crestor daily  Hypertension   Pericardial effusion   chronic; felt to be poss related to minoxidil >> DC'd  Pulmonary hypertension ([place]) 06/18/2015  Respiratory failure, acute hypoxic, post-operative  07/08/2015  Requiring ECMO support  Respiratory failure, post-operative (HCC)   S/P cardiac catheterization   a. R/L HC 06/18/15:  mLAD 30%; severe pulmo HTN with PA sat 43%, CI 1.86, prominent V waves indicative of MR; resting hypoxemia O2 sat 86% on RA  S/P minimally invasive mitral valve repair 07/08/2015  Complex valvuloplasty including artificial Gore-tex neochord placement x10 and 26 mm Sorin Memo 3D Rechord ring annuloplasty via right mini thoracotomy approach  S/P minimally invasive mitral valve repair 07/08/2015  Complex valvuloplasty including artificial Gore-tex neochord placement x10 and 26 mm Sorin Memo 3D Rechord ring annuloplasty via right mini thoracotomy approach  Severe mitral regurgitation   Shortness of breath dyspnea   Stroke ([place]) 1997  no residual effect  Vitamin D deficiency   Wears glasses  Past Surgical History: Past Surgical History: Procedure Laterality Date  A/V FISTULAGRAM Right 08/16/2017  Procedure:  A/V Fistulagram;  Surgeon: Conrad Coronaca, MD;  Location: [facility];  Service: Cardiovascular;  Laterality: Right;  AV FISTULA PLACEMENT Left 10/22/2015  Procedure: RADIOCEPHALIC ARTERIOVENOUS (AV) FISTULA CREATION;  Surgeon: Serafina Mitchell, MD;  Location: [facility];  Service: Vascular;  Laterality: Left;  AV FISTULA PLACEMENT Right 05/22/2017  Procedure: ARTERIOVENOUS (AV) FISTULA CREATION-RIGHT ARM;  Surgeon: Waynetta Sandy, MD;  Location: [facility];  Service: Vascular;  Laterality: Right;  CANNULATION FOR CARDIOPULMONARY BYPASS N/A 07/08/2015  Procedure: CANNULATION FOR ECMO;  Surgeon: Rexene Alberts, MD;  Location: [facility];  Service: Open Heart Surgery;  Laterality: N/A;  CARDIAC CATHETERIZATION    CARDIAC CATHETERIZATION N/A 06/18/2015  Procedure: Right/Left Heart Cath and Coronary Angiography;  Surgeon: Jettie Booze, MD;  Location: [facility];  Service: Cardiovascular;  Laterality: N/A;  CESAREAN SECTION    x 2  COLONOSCOPY    CORONARY ARTERY BYPASS GRAFT N/A  01/13/2022  Procedure: CORONARY ARTERY BYPASS GRAFTING (CABG) TIMES THREE ON CARDIOPULMONARY BYPASS. LIMA TO LAD, SVG TO PD, SVG TO DIAG;  Surgeon: [name], MD;  Location: [facility];  Service: Open Heart Surgery;  Laterality: N/A;  ENDOVEIN HARVEST OF GREATER SAPHENOUS VEIN Right 01/16/2022  Procedure: ENDOVEIN HARVEST OF GREATER SAPHENOUS VEIN;  Surgeon: [name], MD;  Location: [facility];  Service: Open Heart Surgery;  Laterality: Right;  RIGHT UPPER AND LOWER LEG  EXCHANGE OF A DIALYSIS CATHETER N/A 06/28/2017  Procedure: EXCHANGE OF A DIALYSIS CATHETER;  Surgeon: Waynetta Sandy, MD;  Location: [place] [facility];  Service: Vascular;  Laterality: N/A;  FISTULA SUPERFICIALIZATION Left 12/28/2015  Procedure: SUPERFICIALIZATION LEFT RADIOCEPHALIC FISTULA;  Surgeon: Serafina Mitchell, MD;  Location: [facility];  Service: Vascular;  Laterality: Left;  FISTULA SUPERFICIALIZATION Right 08/21/2017  Procedure: FISTULA SUPERFICIALIZATION RIGHT ARM;  Surgeon: Waynetta Sandy, MD;  Location: [place];  Service: Vascular;  Laterality: Right;  INSERTION OF DIALYSIS CATHETER Right 04/28/2017  Procedure: INSERTION OF DIALYSIS CATHETER-RIGHT INTERNAL JUGULAR PLACEMENT;  Surgeon: Elam Dutch, MD;  Location: [place];  Service: Vascular;  Laterality: Right;  LEFT HEART CATH AND CORONARY ANGIOGRAPHY N/A 12/28/2021  Procedure: LEFT HEART CATH AND CORONARY ANGIOGRAPHY;  Surgeon: Troy Sine, MD;  Location: [facility];  Service: Cardiovascular;  Laterality: N/A;  LIGATION OF ARTERIOVENOUS  FISTULA Left 04/28/2017  Procedure: LIGATION OF ARTERIOVENOUS  FISTULA;  Surgeon: Elam Dutch, MD;  Location: [facility];  Service: Vascular;  Laterality: Left;  MITRAL VALVE REPAIR Right 07/08/2015  Procedure: MINIMALLY INVASIVE MITRAL VALVE REPAIR with a 26 Sorin Memo 3D Rechord;  Surgeon: Rexene Alberts, MD;  Location: [facility];  Service: Open Heart Surgery;  Laterality: Right;  MULTIPLE EXTRACTIONS WITH ALVEOLOPLASTY N/A  06/30/2015  Procedure: MULTIPLE EXTRACTIONS OF TOOTH #'S 4  AND 30 WITH ALVEOLOPLASTY AND GROSS DEBRIDEMENT  OF REMAINING TEETH;  Surgeon: Lenn Cal, DDS;  Location: [facility];  Service: Oral Surgery;  Laterality: N/A;  PERIPHERAL VASCULAR CATHETERIZATION Left 03/28/2016  Procedure: A/V Fistulagram;  Surgeon: Serafina Mitchell, MD;  Location: [facility];  Service: Cardiovascular;  Laterality: Left;  lower arm  REPAIR OF PATENT FORAMEN OVALE N/A 01/02/2022  Procedure: REPAIR OF PATENT FORAMEN OVALE;  Surgeon: [name], MD;  Location: [facility];  Service: Open Heart Surgery;  Laterality: N/A;  TEE WITHOUT CARDIOVERSION N/A 06/08/2015  Procedure: TRANSESOPHAGEAL ECHOCARDIOGRAM (TEE);  Surgeon: Lelon Perla, MD;  Location: [place];  Service: Cardiovascular;  Laterality:  N/A;  TEE WITHOUT CARDIOVERSION N/A 07/08/2015  Procedure: TRANSESOPHAGEAL ECHOCARDIOGRAM (TEE);  Surgeon: Rexene Alberts, MD;  Location: [facility];  Service: Open Heart Surgery;  Laterality: N/A;  TEE WITHOUT CARDIOVERSION N/A 01/21/2022  Procedure: TRANSESOPHAGEAL ECHOCARDIOGRAM (TEE);  Surgeon: [name], MD;  Location: [facility];  Service: Open Heart Surgery;  Laterality: N/A;  TRICUSPID VALVE REPLACEMENT N/A 12/29/2021  Procedure: TRICUSPID VALVE REPAIR USING AN 36MM EDWARDS MC3 TRICUSPID ANNULOPLASTY RING;  Surgeon: [name], MD;  Location: [place];  Service: Open Heart Surgery;  Laterality: N/A; HPI: 66 yo F presented [facility] ED 2/4 with chest pain. ECHO revealed severe RV failure, felt to be  consistent with chronic process such as severe pulmonary HTN. 2/7 cardiac cath - three vessel dz;  2/10 CABGx3, TVR; 2/13 chest tube removed; ETT 2/10-2/16; CVVHD.  PMH ESRD on HD, non-obstructive CAD, suspected pulmonary hypertension, Hx PE on eliquis. Passed [place] on 2/16; RN had concerns for potential dysphagia and requested SLP swallow consult.  Subjective: alert and cooperative  Recommendations for follow  up therapy are one component of a multi-disciplinary discharge planning process, led by the attending physician.  Recommendations may be updated based on patient status, additional functional criteria and insurance authorization. Assessment / Plan / Recommendation Clinical Impressions 01/23/2022 Clinical Impression Pt presents with a mild oropharyngeal dysphagia. She has some lingual rocking and delayed oral transit. Mastication is a little effortful but she clears her oral cavity of solid foods well. She was not able to clear a barium tablet from her oral cavity with purees or nectar thick liquids, ultimately chewing it to get rid of it. However, this is a larger tablet that may be more challenging to swallow than some of her pills, including one in particular that per MD/RN report cannot be crushed. Pharyngeally she has mildly impaired timing that results in trace penetration to the vocal folds (PAS 5) with thin liquids. She was not observed to aspirate, but she also could not clear penetrates despite cues to cough/clear her throat. Attempted to use chin tuck but she physically and/or cognitively could not maintain this position. Also considering her mentation and that her positioning will not be as upright when she is on CRRT, would start with Dys 3 diet and nectar thick liquids. If offering pills that can't be crushed, would offer them whole in puree but would monitor for oral clearance. SLP Visit Diagnosis Dysphagia, pharyngeal phase (R13.13) Attention and concentration deficit following -- Frontal lobe and executive function deficit following -- Impact on safety and function Mild aspiration risk   Treatment Recommendations 01/23/2022 Treatment Recommendations Therapy as outlined in treatment plan below   Prognosis 01/23/2022 Prognosis for Safe Diet Advancement Good Barriers to Reach Goals Cognitive deficits Barriers/Prognosis Comment -- Diet Recommendations 01/23/2022 SLP Diet Recommendations Dysphagia 3 (Mech  soft) solids;Nectar thick liquid Liquid Administration via Cup;Straw Medication Administration Whole meds with puree Compensations Slow rate;Small sips/bites;Minimize environmental distractions Postural Changes Other (Comment)   Other Recommendations 01/23/2022 Recommended Consults -- Oral Care Recommendations Oral care BID Other Recommendations Prohibited food (jello, ice cream, thin soups);Remove water pitcher Follow Up Recommendations Acute inpatient rehab (3hours/day) Assistance recommended at discharge Frequent or constant Supervision/Assistance Functional Status Assessment Patient has had a recent decline in their functional status and demonstrates the ability to make significant improvements in function in a reasonable and predictable amount of time. Frequency and Duration  01/23/2022 Speech Therapy Frequency (ACUTE ONLY) min 2x/week Treatment Duration 2 weeks   Oral  Phase 01/23/2022 Oral Phase Impaired Oral - Pudding Teaspoon -- Oral - Pudding Cup -- Oral - Honey Teaspoon -- Oral - Honey Cup -- Oral - Nectar Teaspoon -- Oral - Nectar Cup -- Oral - Nectar Straw Other (Comment) Oral - Thin Teaspoon WFL Oral - Thin Cup -- Oral - Thin Straw Premature spillage;Other (Comment) Oral - Puree Delayed oral transit Oral - Mech Soft -- Oral - Regular Impaired mastication;Delayed oral transit Oral - Multi-Consistency -- Oral - Pill Reduced posterior propulsion;Lingual/palatal residue Oral Phase - Comment --  Pharyngeal Phase 01/23/2022 Pharyngeal Phase Impaired Pharyngeal- Pudding Teaspoon -- Pharyngeal -- Pharyngeal- Pudding Cup -- Pharyngeal -- Pharyngeal- Honey Teaspoon -- Pharyngeal -- Pharyngeal- Honey Cup -- Pharyngeal -- Pharyngeal- Nectar Teaspoon -- Pharyngeal -- Pharyngeal- Nectar Cup -- Pharyngeal -- Pharyngeal- Nectar Straw Penetration/Aspiration before swallow Pharyngeal Material enters airway, remains ABOVE vocal cords then ejected out Pharyngeal- Thin Teaspoon WFL Pharyngeal -- Pharyngeal- Thin Cup --  Pharyngeal -- Pharyngeal- Thin Straw Penetration/Aspiration before swallow Pharyngeal Material enters airway, CONTACTS cords and not ejected out Pharyngeal- Puree WFL Pharyngeal -- Pharyngeal- Mechanical Soft -- Pharyngeal -- Pharyngeal- Regular WFL Pharyngeal -- Pharyngeal- Multi-consistency -- Pharyngeal -- Pharyngeal- Pill -- Pharyngeal -- Pharyngeal Comment --  Cervical Esophageal Phase  01/23/2022 Cervical Esophageal Phase WFL Pudding Teaspoon -- Pudding Cup -- Honey Teaspoon -- Honey Cup -- Nectar Teaspoon -- Nectar Cup -- Nectar Straw -- Thin Teaspoon -- Thin Cup -- Thin Straw -- Puree -- Mechanical Soft -- Regular -- Multi-consistency -- Pill -- Cervical Esophageal Comment -- [name]., M.A. CCC-SLP Acute Rehabilitation Services Pager [phone] Office [phone] 01/23/2022, 5:15 PM                       Medications:     Scheduled Medications:  aspirin EC  325 mg Oral Daily   Or   aspirin  324 mg Per Tube Daily   bisacodyl  10 mg Oral Daily   Or   bisacodyl  10 mg Rectal Daily   chlorhexidine gluconate (MEDLINE KIT)  15 mL Mouth Rinse BID   Chlorhexidine Gluconate Cloth  6 each Topical Daily   darbepoetin (ARANESP) injection - NON-DIALYSIS  100 mcg Subcutaneous Q Sat-1800   docusate  200 mg Per Tube Daily   doxercalciferol  9 mcg Intravenous Q M,W,F-HD   feeding supplement (PROSource TF)  45 mL Per Tube BID   insulin aspart  0-20 Units Subcutaneous Q4H   insulin aspart  8 Units Subcutaneous Q4H   insulin detemir  20 Units Subcutaneous Q12H   latanoprost  1 drop Both Eyes QHS   macitentan  10 mg Oral Daily   midodrine  10 mg Oral TID WC   multivitamin  1 tablet Per Tube QHS   pantoprazole sodium  40 mg Per Tube Daily   rosuvastatin  20 mg Per Tube QHS   sodium chloride flush  10-40 mL Intracatheter Q12H   sodium chloride flush  3 mL Intravenous Q12H   Thrombi-Pad  1 each Topical Once    Infusions:   prismasol BGK 4/2.5 500 mL/hr at 01/23/22 2316    prismasol BGK 4/2.5 500  mL/hr at 01/23/22 2322   sodium chloride 10 mL/hr at 01/24/22 0700   amiodarone 30 mg/hr (01/24/22 0700)   bivalirudin (ANGIOMAX) infusion 0.5 mg/mL (Non-ACS indications) 0.0135 mg/kg/hr (01/24/22 0700)   epinephrine 3 mcg/min (01/24/22 0700)   feeding supplement (VITAL 1.5 CAL) 1,000 mL (01/24/22 0104)   milrinone 0.25 mcg/kg/min (01/24/22  0700)   norepinephrine (LEVOPHED) Adult infusion 4 mcg/min (01/24/22 0700)   prismasol BGK 4/2.5 1,400 mL/hr at 01/24/22 0444   vasopressin 0.03 Units/min (01/24/22 0700)    PRN Medications: sodium chloride, alteplase, food thickener, heparin, hydrOXYzine, ondansetron (ZOFRAN) IV, oxyCODONE, sodium chloride, sodium chloride flush, sodium chloride flush, traMADol    Patient Profile   Ms Mcmanigal is a 66 year old with a history of ESRD, PAH, CVA 1997, HTN,  DMII, hyperlipidemia, PE, and MVR in 0263 complicated by ECMO.    Assessment/Plan  1. Post-cardiotomy cardiogenic shock - likely due predominantly to end-stage RV failure. (Required ECMO in 2016 post MVR) - Post op platelets down to 30. Switched to Bival. HIT negative.  - continue pressor support with NE, EPI, milrinone and VP. CO-OX 72%.  -Hold macitentan today.  - prognosis quite poor at this point.  Luiz Blare tomorrow. NPO after MN and hold Tube Feeds.     2. PAH with cor-pulmonale, end-stage - she clearly has mixed PH (predominantly WHO Groups II-III but perhaps contributions from I & IV) - Hold macitentan. Given her hemodynamics  doubt she will have much to gain from further selective pulmonary artery vasodilators but we can use PA cath to help figure that out - Continue epi + milrinone.    3. Severe 3vCAD s/p CABG x3 with closure PFO + TV ring on 01/16/2022 - no current s/s angina - continue ASA/Statin   4. ESRD - currently on CVVHD   5. SVT/possible PAF -- continue IV amio and Bival. HIT negative.    6. H/O PE - 11/2020 VQ : unchanged left lower lobe apical segment PE - VQ 01/02/22  low prob - continue Bival . HIT negative.    6. H/O MV Ring 2016 - stable on echo    7. DM2 - cont SSI   Plan RHC tomorrow. Not sure she could tolerate today. She is agreeable.   Length of Stay: [place], NP  01/24/2022, 7:56 AM  Advanced Heart Failure Team Pager [phone] (M-F; 7a - 5p)  Please contact [facility] for night-coverage after hours (5p -7a ) and weekends on [email or web address]  Agree with above.   Remains very tenuous. On multiple pressors/inotropes. On CVVHD pulling -50. CVP 22. Co-ox 72%  Much more dyspneic today. Denies CP   .General:  Ill-appearing. Dyspneic HEENT: normal Neck: supple. Hard to see JVP  Carotids 2+ bilat; no bruits. No lymphadenopathy or thryomegaly appreciated. Cor: PMI nondisplaced. Regular tachy No rubs, gallops or murmurs. Lungs: crackles Abdomen: obese soft, nontender, nondistended. No hepatosplenomegaly. No bruits or masses. Good bowel sounds. Extremities: no cyanosis, clubbing, rash, 2-3+ edema  RUE AVF. RFV HD cath. LFV TLC Neuro: alert & orientedx3, cranial nerves grossly intact. moves all 4 extremities w/o difficulty. Affect pleasant  She is very tenuous with end-stage RV failure. Requiring multiple pressors inotropes. Plan was for PA cath today from [facility] approach but appears too dyspneic. Will ask Renal if we can pull more fluid today and reassess in am. Watch for worsening respiratory failure. Prognosis guarded.   CRITICAL CARE Performed by: Glori Bickers  Total critical care time: 35 minutes  Critical care time was exclusive of separately billable procedures and treating other patients.  Critical care was necessary to treat or prevent imminent or life-threatening deterioration.  Critical care was time spent personally by me (independent of midlevel providers or residents) on the following activities: development of treatment plan with patient and/or surrogate as well as nursing, discussions  with consultants, evaluation of  patient's response to treatment, examination of patient, obtaining history from patient or surrogate, ordering and performing treatments and interventions, ordering and review of laboratory studies, ordering and review of radiographic studies, pulse oximetry and re-evaluation of patient's condition.  Glori Bickers, MD  9:01 AM

## 2022-01-24 NOTE — Progress Notes (Signed)
Received consult for PIV placement for heart cath in AM. This patient has a R arm restriction. D/t limited vasculature, this nurse instructed patient's nurse to re-consult IV team in light of vein preservation. VU. Fran Lowes, RN VAST

## 2022-01-24 NOTE — Progress Notes (Signed)
18 Days Post-Op Procedure(s) (LRB): CORONARY ARTERY BYPASS GRAFTING (CABG) TIMES THREE ON CARDIOPULMONARY BYPASS. LIMA TO LAD, SVG TO PD, SVG TO DIAG (N/A) TRICUSPID VALVE REPAIR USING AN 36MM EDWARDS MC3 TRICUSPID ANNULOPLASTY RING (N/A) TRANSESOPHAGEAL ECHOCARDIOGRAM (TEE) (N/A) APPLICATION OF CELL SAVER (N/A) ENDOVEIN HARVEST OF GREATER SAPHENOUS VEIN (Right) REPAIR OF PATENT FORAMEN OVALE (N/A) Subjective: "Can I go home" Dyspneic with talking  Objective: Vital signs in last 24 hours: Temp:  [97.2 F (36.2 C)-98.2 F (36.8 C)] 98.2 F (36.8 C) (02/28 0400) Pulse Rate:  [75-107] 99 (02/28 0700) Cardiac Rhythm: Sinus tachycardia (02/27 1930) Resp:  [3-25] 21 (02/28 0700) BP: (89-107)/(48-72) 89/51 (02/28 0600) SpO2:  [86 %-100 %] 100 % (02/28 0700) Arterial Line BP: (95-121)/(41-57) 118/54 (02/28 0700) Weight:  [72 kg] 72 kg (02/28 0500)  Hemodynamic parameters for last 24 hours: CVP:  [7 mmHg-34 mmHg] 14 mmHg  Intake/Output from previous day: 02/27 0701 - 02/28 0700 In: 2649 [I.V.:1304; NG/GT:1345] Out: 2025 [Stool:530] Intake/Output this shift: No intake/output data recorded.  General appearance: alert, cooperative, and no distress Neurologic: intact  Lab Results: Recent Labs    01/23/22 0432 01/24/22 0354  WBC 18.0* 15.0*  HGB 7.6* 7.4*  HCT 24.2* 23.8*  PLT 171 163   BMET:  Recent Labs    01/23/22 1730 01/24/22 0354  NA 135 134*  K 4.5 4.2  CL 98 102  CO2 26 24  GLUCOSE 227* 168*  BUN 18 16  CREATININE 1.46* 1.32*  CALCIUM 9.7 9.3    PT/INR: No results for input(s): LABPROT, INR in the last 72 hours. ABG    Component Value Date/Time   PHART 7.425 01/20/2022 1944   HCO3 23.6 01/20/2022 1944   TCO2 25 01/20/2022 1944   ACIDBASEDEF 1.0 01/20/2022 1944   O2SAT 72.4 01/24/2022 0449   CBG (last 3)  Recent Labs    01/23/22 1936 01/23/22 2306 01/24/22 0353  GLUCAP 191* 97 147*    Assessment/Plan: S/P Procedure(s) (LRB): CORONARY ARTERY  BYPASS GRAFTING (CABG) TIMES THREE ON CARDIOPULMONARY BYPASS. LIMA TO LAD, SVG TO PD, SVG TO DIAG (N/A) TRICUSPID VALVE REPAIR USING AN 36MM EDWARDS MC3 TRICUSPID ANNULOPLASTY RING (N/A) TRANSESOPHAGEAL ECHOCARDIOGRAM (TEE) (N/A) APPLICATION OF CELL SAVER (N/A) ENDOVEIN HARVEST OF GREATER SAPHENOUS VEIN (Right) REPAIR OF PATENT FORAMEN OVALE (N/A) Remains critically ill CV- in Sr on amidarone  Co-ox 72, CVP 22  Restarted on macitentan  Milrinone 0.25, epi 3, norepi 4, vasopressin 0.03  Wean drips as tolerated RESP- sat Ok on 2 L Spink RENAL- on CCVHD, 100 negative last 24 hours GI- tolerating TF, starting D III diet Nutrition- albumin 2.4, on goal TF ENDO- CBG variable ID- afebrile, WBC down to 15K Deconditioning- severe- PT   LOS: 23 days    Jody Nguyen 01/24/2022

## 2022-01-24 NOTE — Progress Notes (Signed)
Speech Language Pathology Treatment: Dysphagia  Patient Details Name: Jody Nguyen MRN: [id number] DOB: Apr 14, 1956 Today's Date: 01/24/2022 Time: 4268-3419 SLP Time Calculation (min) (ACUTE ONLY): 14 min  Assessment / Plan / Recommendation Clinical Impression  Pt was seen for f/u after MBS on previous date. Pt had some confusion about whether testing had been completed, so results and recommendations were reviewed with pt and husband. Pt was positioned as upright as able while undergoing CRRT with femoral catheter, with no overt signs of aspiration noted with mechanical soft solids and nectar thick liquids. Swallowing appeared to be grossly functional with these consistencies. Will continue these for now with good prognosis for advancement, especially if she is able to sit more upright and if her mentation continues to improve.    HPI HPI: 67 yo F presented to [facility] ED 2/4 with chest pain. ECHO revealed severe RV failure, felt to be  consistent with chronic process such as severe pulmonary HTN. 2/7 cardiac cath - three vessel dz;  2/10 CABGx3, TVR; 2/13 chest tube removed; ETT 2/10-2/16; CVVHD.  PMH ESRD on HD, non-obstructive CAD, suspected pulmonary hypertension, Hx PE on eliquis. Passed [place] on 2/16; RN had concerns for potential dysphagia and requested SLP swallow consult.      SLP Plan  Continue with current plan of care      Recommendations for follow up therapy are one component of a multi-disciplinary discharge planning process, led by the attending physician.  Recommendations may be updated based on patient status, additional functional criteria and insurance authorization.    Recommendations  Diet recommendations: Dysphagia 3 (mechanical soft);Nectar-thick liquid Liquids provided via: Cup;Straw Medication Administration: Whole meds with puree Supervision: Staff to assist with self feeding Compensations: Slow rate;Small sips/bites;Minimize environmental  distractions Postural Changes and/or Swallow Maneuvers: Seated upright 90 degrees (as upright as able while on CRRT)                Oral Care Recommendations: Oral care BID Follow Up Recommendations: Acute inpatient rehab (3hours/day) Assistance recommended at discharge: Frequent or constant Supervision/Assistance SLP Visit Diagnosis: Dysphagia, pharyngeal phase (R13.13) Plan: Continue with current plan of care           [name]., M.A. [facility] Pager [phone] Office [phone] 01/24/2022, 2:40 PM

## 2022-01-24 NOTE — Progress Notes (Signed)
NAME:  Jody Nguyen, MRN:  [id number], DOB:  04/28/1956, LOS: 44 ADMISSION DATE:  01/15/2022, CONSULTATION DATE:  01/02/2022 REFERRING MD:  Jody Nguyen - TRH, CHIEF COMPLAINT:  SOB   History of Present Illness:  66 year old woman who presented to [facility] ED 2/4 with CP radiating to her arm. PMHx significant for  ESRD (on HD), non-obstructive CAD, suspected pulmonary hypertension, PE (on Eliquis). Recent ED presentation 2/1 for similar symptoms (troponin 100s). F/u with Cardiology as an outpatient 2/2 and CP was felt to be related to volume shift (occurring during dialysis).   On ED presentation 2/4, patient reported episodic chest pain, non-exertional and intermittent x 1 week with associated nausea and SOB. Non-specific EKG changes. Cardiology consulted, STEMI ruled out. Troponin elevated at 200. Placed on [place] for SpO2 80s. Admitted to [facility] for observation with concern for PE and started on heparin gtt. VQ with low probability for PE. Echo 2/19 demonstrated severe RV/RA enlargement, severely reduced RV function and septal bowing; felt to be consistent with chronic process such as severe pulmonary HTN.    PCCM has been consulted in this setting.  Pertinent Medical History:  ESRD PE Pulmonary HTN  CAD CVA HLD HTN S/p MV repair  Pericardial effusion r/t minoxidil  Diastolic HF DM2  Significant Hospital Events: Including procedures, antibiotic start and stop dates in addition to other pertinent events   2/4 ED presentation with recurring chest pain -- STEMI ruled out. Stable, mildly hypoxic and placed on [place] 2/5 Admit to [facility] for obvs. Concern for PE, ECHO performed by cardiology reportedly shows RV failure, but felt more consistent with chronic process like severe pulmHTN. Awaiting VQ scan. Pulm consulted  2/7 Cardiac cath. 3 vessel dz. CABG rec 2/10 CABG, TVR 2/11 Continued vasoplegic shock with very high vasopressor requirements  2/12 Given methylene blue x 2 2/13 Chest tube removed 2/14 JP  removed 2/15 pacing wires removed 2/16 milrinone stopped, extubated 2/17 Zosyn for GNR bacteremia-> Morganella morganii 2/18 Arterial line, central line, vascath all removed and replaced. 2/19 Milrinone back on. Co-ox improved. ECHO Left ventricular ejection fraction, by estimation, is 50 to 55%. The left ventricle has low normal function. No regional WM abnormality. Right Ventricle: Ventricular septum is flattened in systole and diastole  suggesting RV pressure and volume overload. The right ventricular size is  severely enlarged. Right vetricular wall thickness was not well  visualized. Right ventricular systolic  function is severely reduced. There is moderately elevated pulmonary  artery systolic pressure 3/61 still high pressor needs. CRRT stopped. Having intermittent bouts of tachycardia 2/21 Hemodynamics better. Weaning Epi. Still on Norepi and inotropic support. Supported on BIPAP over night and tolerated well. On am rounds more lethargic w/ generalized pain everywhere. Co-ox > 70. Glucose poorly controlled. 2/22 Escalating pressor requirements (NE, Epi, Vaso). Continues on milrinone. Afib with RVR overnight, now with persistent AF (on amio, bival). Co-ox 73%. Hgb 7.3. WBC uptrending (25 from 20), Cr uptrending, CRRT restarted. Tolerating BiPAP. 2/23 Improving pressor needs. Stable Epi/Vaso, downtitrating NE. Milrinone 0.25. Persistent AF, rate controlled. Co-ox 65.7%. Ongoing CRRT. BiPAP QHS. 2/24 Continued decreasing pressor needs, stable epi/vaso, downtitrating Levo.  Milrinone 0.25.  Intermittent A-fib, in and out of NSR, rates 90s.  Co-ox 76.1%.  Ongoing CRRT, did not utilize BiPAP overnight. FEES with SLP. 2/25 confused.  Pressor requirements down some.  Got 1 unit of blood for hemoglobin of 7, Co. ox dropped. Vasopressin decreased to 0.03 units.   Interim History / Subjective:  Jody Nguyen complaints  of needing to use the bathroom.  Objective:  Blood pressure (!) 89/51, pulse 99,  temperature 97.7 F (36.5 C), temperature source Oral, resp. rate (!) 21, height 4' 11.5" (1.511 m), weight 72 kg, SpO2 100 %. CVP:  [7 mmHg-34 mmHg] 14 mmHg      Intake/Output Summary (Last 24 hours) at 01/24/2022 0844 Last data filed at 01/24/2022 0800 Gross per 24 hour  Intake 2699.26 ml  Output 3912 ml  Net -1212.74 ml    Filed Weights   01/22/22 0700 01/23/22 0500 01/24/22 0500  Weight: (S) 76.8 kg 76.5 kg 72 kg   Physical Examination: General: chronically ill appearing woman lying in bed in NAD HEENT: [place]/AT, eyes anicteric Pulmonary: tachypnea without accessory muscle use, on [place], no wheezing, faint rhales Cardiac: S1S2, minimally tachycardic Abdomen obese, soft, mildly TTP RLQ Extremities: persistent edema, no cyanosis Neuro: awake, alert, moving extremities but globally weak Derm: warm, dry, no rashes  Coox 72% Na+  134 BUN 16 Cr 1.32 Phos 2.3 WBC 15 H/H 7.4/23.8  CT neck: no acute abnormalities  [facility] records reviewed>   RHC 04/2019: RA 10, PA 65/31 (43), PCWP 18 (v-20), BP 187/110, PVR 5, CI 2.83 RHC 09/2019 (done on a non HD day) PA 55/25 (36), PCWP 18, BP 152/93 (113), PA sat 81, PVR 3.96 & 3.52, CI 2.69 by Fick and 3.02 by Thermo  09/06/2021 RHC: RA 8, RV 52/3 (8), PA 50/23 (32) PCWP 21  Fick: CO 5.8, CI 3.2 Thermodilution: CO 3.97, CI 2.33  Assessment & Plan:  Principal Problem:   Chest pain, Multivessel CAD Active Problems:   Hyperlipidemia associated with type 2 diabetes mellitus (HCC)   Anemia of chronic renal failure   Type 2 diabetes mellitus (HCC)   Obesity (BMI 30-39.9)   Pulmonary hypertension (HCC)   Chronic combined systolic and diastolic heart failure (HCC)   Atrial fibrillation (HCC)   End stage renal disease on dialysis (HCC)   History of stroke   QT prolongation   SVT (supraventricular tachycardia) (HCC)   Coronary artery disease involving native coronary artery of native heart with unstable angina pectoris (HCC)   Hyponatremia    GERD (gastroesophageal reflux disease)   Septic shock (HCC)   Gram-negative bacteremia   Hx of CABG   S/P TVR (tricuspid valve repair)   Cardiogenic shock (HCC)   Tachycardia   S/P CABG (coronary artery bypass graft)   Metabolic encephalopathy   Acute hypoxemic respiratory failure (HCC)  CAD with TR, s/p CABG x 3 and tricuspid repair 2/10 Acute-on-chronic HFrEF, acute right heart failure> has had some recovery of LV function, but RV function is severely reduced.  Despite controlled oxygenation, I am concerned that her cardiac output is limited by her RV failure due to pulmonary hypertension that remains uncontrolled off of her maintenance medications, although she may not have disease that will be amenable to treatment with pulmonary vasodilators. Etiology of PH unknown; per [facility] records-- has WHO group II and I (2/2 ESRD). 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 PFTs with chronic restriction potentially related to obesity. --Hold macitentan today due to concern for inducing pulmonary edema.  -Planning for RHC tomorrow; worry that she has mixed PH and is at risk for worsening LHF and pulmonary edema with pulmonary vasodilators. --Con't AC with bival. -Consult cardiology-- planning on RHC likely tomorrow.  Ideally would have Swan catheter to  monitor hemodynamics more closely with reinitiation of PAH therapy.    -Not a candidate for guideline directed medical therapy or beta-blocker at this point due to ongoing hypotension -- daily aspirin and statin -Appreciate Cardiology's management  Shock-concern that this is mostly cardiogenic at this point.  Lines have been changed out and she has remained on broad-spectrum antibiotics, making septic shock less likely due to ongoing persistent source of her shock.  Vasoplegia should have been resolved by now. - hold macitentan - Continue vaso, epi,  norepi, milrinone -monitor off antibiotics  Afib, persistent; RVR resolved.  Back in sinus rhythm today. Afib with RVR noted 2/21PM with rates into 150s. -Continue amiodarone. Con't IV amio until transitioning off positive inotropes and chronotropes. -Tele monitoring -monitor and replete electrolytes PRN -Bivalirudin for anticoagulation  Acute respiratory failure with hypoxia, suspected component of PAH.  Most recent chest x-ray on 2/25 not commencing of-supplemental oxygen to maintain SPO2 greater than 90%. - holding pulm vasodilators - supplemental O2 to maintain SpO2>90% -pull more volume with CRRT.  ESRD on HD Thrombosed AVF -Con't CRRT; likely needs more volume off -Eventually will need tunneled dialysis catheter-- should ideally be more stable for this  Acute metabolic encephalopathy- likely ICU delirium vs less likely sepsis.  -delirium precautions, avoid deliriogenic meds -con't CRRT  DM2 w/ hyperglycemia, uncontrolled Con't Levemir 20 units bid; may need to increase if still needing TF with liberalized diet -resistant scale SSI -goal BG 140-180 -con't TF coverage insulin; if transitioning to adequate oral intake we can switch to mealtime insulin, but so far TF still in use.  Anemia of critical illness and renal failure, losing blood frequently in CRRT filters -transfuse for Hb<7 or hemodynamically significant bleeding -EPO per nephro  Thrombocytopenia - suspect CRRT related -monitor  dysphagia At risk for malnutrition -Appreciate SLP's management; dysphagia 3 with nectar thick for now -con't TF until able to ensure adequate PO intake to meet her needs    Best Practice: (right click and "Reselect all SmartList Selections" daily)   Diet/type: tubefeeds  , dysphagia 3 DVT prophylaxis: other- bivalirudin GI prophylaxis: PPI Lines: Central line, Dialysis Catheter, Arterial Line, and yes and it is still needed  Foley:  N/A Code Status:  full code Last date of  multidisciplinary goals of care discussion [Family updated 2/27 at bedside]    This patient is critically ill with multiple organ system failure which requires frequent high complexity decision making, assessment, support, evaluation, and titration of therapies. This was completed through the application of advanced monitoring technologies and extensive interpretation of multiple databases. During this encounter critical care time was devoted to patient care services described in this note for 35 minutes.  Julian Hy, DO 01/24/22 3:10 PM [facility]

## 2022-01-24 NOTE — Progress Notes (Signed)
[facility] Progress Note   Assessment/ Plan:   OP HD: MWF [facility] 3:15h   450/A1.5   75.5kg   2K/2Ca bath  P2   AVF  Hep  2500 - Hectoral 61mg IV q HD - Sensipar 96mPO q HD - Mircera 15034mIV q 2 weeks (ordered, not yet given. Last Mircera 100 on 12/07/21)       Assessment/Plan:   CAD s/p CABG x3, TR sp TVR:  per CT surgery   [facility]  [facility] started 2/11. Did not tolerate CRRT holiday due to ^wob/ hypoxia. Continue CRRT--> once appropriate to switch to IHD will likely need to do it the very next day to maintain volume--> however this is a long way off and she's more dyspneic today- will increase UF goal to net neg 100 mL/ hr  Shock - cardiogenic and septic. Remains on pressors (vaso/ levo) and inotropes (epi/milrinone)--> has essentially end-stage RV failure with septal bowing. Morganella bacteremia -  s/p Zosyn  Anemia CKD: Sq darbe 100ug weekly on Saturday. 1st 2/25.  Metabolic bone disease: resume home binders and sensipar when taking PO. Cont IV vdra.   Infiltrated AVF - AVF infiltrated on 2/7. Post-op AVF is nonfunctional, VVS aware. [facility].  P[facility]:  [facility] Has RV failure now  T2DM-per primary service  Hx of MV replacement - in 2016  Subjective:    Tried for RHC yesterday but too SOB--> will try again tomorrow.  CVP is up.  Had 2 longer breaks from CRRT yesterday.  Remains on multiple pressor/ inotropes.   Objective:   BP (!) 96/46 (BP Location: Left Arm)    Pulse 100    Temp 97.7 F (36.5 C) (Oral)    Resp 13    Ht 4' 11.5" (1.511 m)    Wt 72 kg    SpO2 100%    BMI 31.52 kg/m   Physical Exam: GenFGH:WEXHBAD CVS: tachy Resp: coarse, dyspneic today Abd: soft Ext: 1+ anasarca ACCESS: nontunneled HD cath R fem  Labs: BMET Recent Labs  Lab 01/21/22 0416 01/21/22 1610 01/22/22 0444 01/22/22 1556 01/23/22 0432 01/23/22 1730 01/24/22 0354  NA  135 138 136 133* 135 135 134*  K 4.7 4.0 4.3 4.4 4.0 4.5 4.2  CL 100 103 101 98 101 98 102  CO2 _0 GLUCOSE 187* 79 207* 243* 209* 227* 168*  BUN _1 CREATININE 1.28* 1.20* 1.23* 1.25* 1.30* 1.46* 1.32*  CALCIUM 9.7 9.8 9.4 9.4 9.3 9.7 9.3  PHOS 2.4* 2.1* 2.3* 1.8* 3.1 3.3 2.3*   CBC Recent Labs  Lab 01/21/22 0416 01/21/22 0917 01/22/22 0442 01/23/22 0432 01/24/22 0354  WBC 19.5* 18.0* 20.8* 18.0* 15.0*  NEUTROABS 14.6*  --  16.7* 15.0* 11.9*  HGB 7.0* 7.1* 8.0* 7.6* 7.4*  HCT 23.0* 23.5* 25.3* 24.2* 23.8*  MCV 98.7 95.9 95.1 96.0 96.4  PLT 219 190 187 171 163      Medications:     aspirin EC  325 mg Oral Daily   Or   aspirin  324 mg Per Tube Daily   bisacodyl  10 mg Oral Daily   Or   bisacodyl  10 mg Rectal Daily   chlorhexidine gluconate (MEDLINE KIT)  15 mL Mouth Rinse BID   Chlorhexidine Gluconate Cloth  6 each Topical Daily  darbepoetin (ARANESP) injection - NON-DIALYSIS  100 mcg Subcutaneous Q Sat-1800   docusate  200 mg Per Tube Daily   doxercalciferol  9 mcg Intravenous Q M,W,F-HD   feeding supplement (PROSource TF)  45 mL Per Tube BID   insulin aspart  0-20 Units Subcutaneous Q4H   insulin aspart  8 Units Subcutaneous Q4H   latanoprost  1 drop Both Eyes QHS   midodrine  10 mg Oral TID WC   multivitamin  1 tablet Per Tube QHS   pantoprazole sodium  40 mg Per Tube Daily   rosuvastatin  20 mg Per Tube QHS   sodium chloride flush  10-40 mL Intracatheter Q12H   sodium chloride flush  3 mL Intravenous Q12H   Thrombi-Pad  1 each Topical Once     Madelon Lips, MD 01/24/2022, 10:46 AM

## 2022-01-24 NOTE — H&P (View-Only) (Signed)
Advanced Heart Failure Rounding Note  PCP-Cardiologist: [name], MD   Subjective:   2/10 S/P CABG PFO closure and TV ring  with ESRD.   Norepi 4 mcg Epi  mcg 3 Vaso 0.3 Milrinone 0.25 mcg . CVP 21-22   Continue amio drip.   On CVVHD pulling 50 per hour.   CO-OX 72%.   Complaining of shortness of breath.    Objective:   Weight Range: 72 kg Body mass index is 31.52 kg/m.   Vital Signs:   Temp:  [97.2 F (36.2 C)-98.2 F (36.8 C)] 97.7 F (36.5 C) (02/28 0751) Pulse Rate:  [75-107] 99 (02/28 0700) Resp:  [3-25] 21 (02/28 0700) BP: (89-107)/(48-72) 89/51 (02/28 0600) SpO2:  [86 %-100 %] 100 % (02/28 0700) Arterial Line BP: (95-121)/(41-57) 118/54 (02/28 0700) Weight:  [72 kg] 72 kg (02/28 0500) Last BM Date : 01/23/22  Weight change: Filed Weights   01/22/22 0700 01/23/22 0500 01/24/22 0500  Weight: (S) 76.8 kg 76.5 kg 72 kg    Intake/Output:   Intake/Output Summary (Last 24 hours) at 01/24/2022 0756 Last data filed at 01/24/2022 0700 Gross per 24 hour  Intake 2649 ml  Output 3867 ml  Net -1218 ml      Physical Exam   CVP 21-22  General:  On CVVHD . Dyspneic at rest.  HEENT: + [place] Neck: Supple. JVP to jaw  Carotids 2+ bilat; no bruits. No lymphadenopathy or thyromegaly appreciated. Cor: PMI nondisplaced. Regular rate & rhythm. No rubs, gallops or murmurs. Lungs: Clear on 2 liters  Abdomen: Soft, nontender, nondistended. No hepatosplenomegaly. No bruits or masses. Good bowel sounds. Extremities: No cyanosis, clubbing, rash, edema. L fem vein central line. L fem art line. RUE  AVF Neuro: Alert & orientedx3, cranial nerves grossly intact. moves all 4 extremities w/o difficulty. Affect pleasant   Telemetry   SR/ST   EKG    N/A   Labs    CBC Recent Labs    01/23/22 0432 01/24/22 0354  WBC 18.0* 15.0*  NEUTROABS 15.0* 11.9*  HGB 7.6* 7.4*  HCT 24.2* 23.8*  MCV 96.0 96.4  PLT 171 773   Basic Metabolic Panel Recent Labs     01/23/22 0432 01/23/22 1730 01/24/22 0354  NA 135 135 134*  K 4.0 4.5 4.2  CL 101 98 102  CO2 _0 GLUCOSE 209* 227* 168*  BUN _1 CREATININE 1.30* 1.46* 1.32*  CALCIUM 9.3 9.7 9.3  MG 2.4  --  2.5*  PHOS 3.1 3.3 2.3*   Liver Function Tests Recent Labs    01/23/22 0432 01/23/22 1730 01/24/22 0354  AST 99*  --   --   ALT 78*  --   --   ALKPHOS 111  --   --   BILITOT 0.6  --   --   PROT 6.4*  --   --   ALBUMIN 2.2*   2.4* 2.4* 2.4*   No results for input(s): LIPASE, AMYLASE in the last 72 hours. Cardiac Enzymes No results for input(s): CKTOTAL, CKMB, CKMBINDEX, TROPONINI in the last 72 hours.  BNP: BNP (last 3 results) Recent Labs    12/20/21 2246 01/01/22 0941  BNP 1,753.1* 1,432.4*    ProBNP (last 3 results) No results for input(s): PROBNP in the last 8760 hours.   D-Dimer No results for input(s): DDIMER in the last 72 hours. Hemoglobin A1C No results for input(s): HGBA1C in the last 72 hours. Fasting Lipid Panel No  results for input(s): CHOL, HDL, LDLCALC, TRIG, CHOLHDL, LDLDIRECT in the last 72 hours. Thyroid Function Tests No results for input(s): TSH, T4TOTAL, T3FREE, THYROIDAB in the last 72 hours.  Invalid input(s): FREET3  Other results:   Imaging    CT SOFT TISSUE NECK WO CONTRAST  Result Date: 01/23/2022 CLINICAL DATA:  Soft tissue swelling EXAM: CT NECK WITHOUT CONTRAST TECHNIQUE: Multidetector CT imaging of the neck was performed following the standard protocol without intravenous contrast. RADIATION DOSE REDUCTION: This exam was performed according to the departmental dose-optimization program which includes automated exposure control, adjustment of the mA and/or kV according to patient size and/or use of iterative reconstruction technique. COMPARISON:  None. FINDINGS: PHARYNX AND LARYNX: The nasopharynx, oropharynx and larynx are normal. Visible portions of the oral cavity, tongue base and floor of mouth are normal. Normal  epiglottis, vallecula and pyriform sinuses. The larynx is normal. No retropharyngeal abscess, effusion or lymphadenopathy. SALIVARY GLANDS: Normal parotid, submandibular and sublingual glands. THYROID: Normal. LYMPH NODES: No enlarged or abnormal density lymph nodes. VASCULAR: Major cervical vessels are patent. LIMITED INTRACRANIAL: Normal. VISUALIZED ORBITS: Normal. MASTOIDS AND VISUALIZED PARANASAL SINUSES: Bilateral mastoid fluid. Visualized paranasal sinuses are unremarkable. SKELETON: No bony spinal canal stenosis. No lytic or blastic lesions. UPPER CHEST: Small left pleural effusion OTHER: Nasogastric tube courses below the field of view IMPRESSION: 1. No acute abnormality of the neck. 2. Small left pleural effusion. Electronically Signed   By: Ulyses Jarred M.D.   On: 01/23/2022 21:00   DG Swallowing Func-Speech Pathology  Result Date: 01/23/2022 Table formatting from the original result was not included. Objective Swallowing Evaluation: Type of Study: MBS-Modified Barium Swallow Study  Patient Details Name: Jody Nguyen MRN: [id number] Date of Birth: [birth year] Today's Date: 01/23/2022 Time: SLP Start Time (ACUTE ONLY): 1609 -SLP Stop Time (ACUTE ONLY): 1628 SLP Time Calculation (min) (ACUTE ONLY): 19 min Past Medical History: Past Medical History: Diagnosis Date  Anemia   Chronic diastolic CHF (congestive heart failure) (HCC)   Chronic diastolic heart failure (HCC)   CKD (chronic kidney disease) stage 4, GFR 15-29 ml/min (HCC)   dialysis M/W/F  Constipation   CVA (cerebral infarction) 1997  no residual deficit  Diverticulitis   DM (diabetes mellitus) ([place])   type 2  Heart murmur   History of cardiovascular stress test   a. Myoview Oct 2012 showed EF 49%, no ischemia, LVE  HLD (hyperlipidemia)   takes Crestor daily  Hypertension   Pericardial effusion   chronic; felt to be poss related to minoxidil >> DC'd  Pulmonary hypertension ([place]) 06/18/2015  Respiratory failure, acute hypoxic, post-operative  07/08/2015  Requiring ECMO support  Respiratory failure, post-operative (HCC)   S/P cardiac catheterization   a. R/L HC 06/18/15:  mLAD 30%; severe pulmo HTN with PA sat 43%, CI 1.86, prominent V waves indicative of MR; resting hypoxemia O2 sat 86% on RA  S/P minimally invasive mitral valve repair 07/08/2015  Complex valvuloplasty including artificial Gore-tex neochord placement x10 and 26 mm Sorin Memo 3D Rechord ring annuloplasty via right mini thoracotomy approach  S/P minimally invasive mitral valve repair 07/08/2015  Complex valvuloplasty including artificial Gore-tex neochord placement x10 and 26 mm Sorin Memo 3D Rechord ring annuloplasty via right mini thoracotomy approach  Severe mitral regurgitation   Shortness of breath dyspnea   Stroke ([place]) 1997  no residual effect  Vitamin D deficiency   Wears glasses  Past Surgical History: Past Surgical History: Procedure Laterality Date  A/V FISTULAGRAM Right 08/16/2017  Procedure:  A/V Fistulagram;  Surgeon: Conrad Coronaca, MD;  Location: [facility];  Service: Cardiovascular;  Laterality: Right;  AV FISTULA PLACEMENT Left 10/22/2015  Procedure: RADIOCEPHALIC ARTERIOVENOUS (AV) FISTULA CREATION;  Surgeon: Serafina Mitchell, MD;  Location: [facility];  Service: Vascular;  Laterality: Left;  AV FISTULA PLACEMENT Right 05/22/2017  Procedure: ARTERIOVENOUS (AV) FISTULA CREATION-RIGHT ARM;  Surgeon: Waynetta Sandy, MD;  Location: [facility];  Service: Vascular;  Laterality: Right;  CANNULATION FOR CARDIOPULMONARY BYPASS N/A 07/08/2015  Procedure: CANNULATION FOR ECMO;  Surgeon: Rexene Alberts, MD;  Location: [facility];  Service: Open Heart Surgery;  Laterality: N/A;  CARDIAC CATHETERIZATION    CARDIAC CATHETERIZATION N/A 06/18/2015  Procedure: Right/Left Heart Cath and Coronary Angiography;  Surgeon: Jettie Booze, MD;  Location: [facility];  Service: Cardiovascular;  Laterality: N/A;  CESAREAN SECTION    x 2  COLONOSCOPY    CORONARY ARTERY BYPASS GRAFT N/A  01/04/2022  Procedure: CORONARY ARTERY BYPASS GRAFTING (CABG) TIMES THREE ON CARDIOPULMONARY BYPASS. LIMA TO LAD, SVG TO PD, SVG TO DIAG;  Surgeon: [name], MD;  Location: [facility];  Service: Open Heart Surgery;  Laterality: N/A;  ENDOVEIN HARVEST OF GREATER SAPHENOUS VEIN Right 01/23/2022  Procedure: ENDOVEIN HARVEST OF GREATER SAPHENOUS VEIN;  Surgeon: [name], MD;  Location: [facility];  Service: Open Heart Surgery;  Laterality: Right;  RIGHT UPPER AND LOWER LEG  EXCHANGE OF A DIALYSIS CATHETER N/A 06/28/2017  Procedure: EXCHANGE OF A DIALYSIS CATHETER;  Surgeon: Waynetta Sandy, MD;  Location: [place] [facility];  Service: Vascular;  Laterality: N/A;  FISTULA SUPERFICIALIZATION Left 12/28/2015  Procedure: SUPERFICIALIZATION LEFT RADIOCEPHALIC FISTULA;  Surgeon: Serafina Mitchell, MD;  Location: [facility];  Service: Vascular;  Laterality: Left;  FISTULA SUPERFICIALIZATION Right 08/21/2017  Procedure: FISTULA SUPERFICIALIZATION RIGHT ARM;  Surgeon: Waynetta Sandy, MD;  Location: [place];  Service: Vascular;  Laterality: Right;  INSERTION OF DIALYSIS CATHETER Right 04/28/2017  Procedure: INSERTION OF DIALYSIS CATHETER-RIGHT INTERNAL JUGULAR PLACEMENT;  Surgeon: Elam Dutch, MD;  Location: [place];  Service: Vascular;  Laterality: Right;  LEFT HEART CATH AND CORONARY ANGIOGRAPHY N/A 01/13/2022  Procedure: LEFT HEART CATH AND CORONARY ANGIOGRAPHY;  Surgeon: Troy Sine, MD;  Location: [facility];  Service: Cardiovascular;  Laterality: N/A;  LIGATION OF ARTERIOVENOUS  FISTULA Left 04/28/2017  Procedure: LIGATION OF ARTERIOVENOUS  FISTULA;  Surgeon: Elam Dutch, MD;  Location: [place];  Service: Vascular;  Laterality: Left;  MITRAL VALVE REPAIR Right 07/08/2015  Procedure: MINIMALLY INVASIVE MITRAL VALVE REPAIR with a 26 Sorin Memo 3D Rechord;  Surgeon: Rexene Alberts, MD;  Location: [facility];  Service: Open Heart Surgery;  Laterality: Right;  MULTIPLE EXTRACTIONS WITH ALVEOLOPLASTY N/A  06/30/2015  Procedure: MULTIPLE EXTRACTIONS OF TOOTH #'S 4  AND 30 WITH ALVEOLOPLASTY AND GROSS DEBRIDEMENT  OF REMAINING TEETH;  Surgeon: Lenn Cal, DDS;  Location: [facility];  Service: Oral Surgery;  Laterality: N/A;  PERIPHERAL VASCULAR CATHETERIZATION Left 03/28/2016  Procedure: A/V Fistulagram;  Surgeon: Serafina Mitchell, MD;  Location: [facility];  Service: Cardiovascular;  Laterality: Left;  lower arm  REPAIR OF PATENT FORAMEN OVALE N/A 01/11/2022  Procedure: REPAIR OF PATENT FORAMEN OVALE;  Surgeon: [name], MD;  Location: [facility];  Service: Open Heart Surgery;  Laterality: N/A;  TEE WITHOUT CARDIOVERSION N/A 06/08/2015  Procedure: TRANSESOPHAGEAL ECHOCARDIOGRAM (TEE);  Surgeon: Lelon Perla, MD;  Location: [place];  Service: Cardiovascular;  Laterality:  N/A;  TEE WITHOUT CARDIOVERSION N/A 07/08/2015  Procedure: TRANSESOPHAGEAL ECHOCARDIOGRAM (TEE);  Surgeon: Rexene Alberts, MD;  Location: [facility];  Service: Open Heart Surgery;  Laterality: N/A;  TEE WITHOUT CARDIOVERSION N/A 12/29/2021  Procedure: TRANSESOPHAGEAL ECHOCARDIOGRAM (TEE);  Surgeon: [name], MD;  Location: [facility];  Service: Open Heart Surgery;  Laterality: N/A;  TRICUSPID VALVE REPLACEMENT N/A 12/29/2021  Procedure: TRICUSPID VALVE REPAIR USING AN 36MM EDWARDS MC3 TRICUSPID ANNULOPLASTY RING;  Surgeon: [name], MD;  Location: [place];  Service: Open Heart Surgery;  Laterality: N/A; HPI: 66 yo F presented [facility] ED 2/4 with chest pain. ECHO revealed severe RV failure, felt to be  consistent with chronic process such as severe pulmonary HTN. 2/7 cardiac cath - three vessel dz;  2/10 CABGx3, TVR; 2/13 chest tube removed; ETT 2/10-2/16; CVVHD.  PMH ESRD on HD, non-obstructive CAD, suspected pulmonary hypertension, Hx PE on eliquis. Passed [place] on 2/16; RN had concerns for potential dysphagia and requested SLP swallow consult.  Subjective: alert and cooperative  Recommendations for follow  up therapy are one component of a multi-disciplinary discharge planning process, led by the attending physician.  Recommendations may be updated based on patient status, additional functional criteria and insurance authorization. Assessment / Plan / Recommendation Clinical Impressions 01/23/2022 Clinical Impression Pt presents with a mild oropharyngeal dysphagia. She has some lingual rocking and delayed oral transit. Mastication is a little effortful but she clears her oral cavity of solid foods well. She was not able to clear a barium tablet from her oral cavity with purees or nectar thick liquids, ultimately chewing it to get rid of it. However, this is a larger tablet that may be more challenging to swallow than some of her pills, including one in particular that per MD/RN report cannot be crushed. Pharyngeally she has mildly impaired timing that results in trace penetration to the vocal folds (PAS 5) with thin liquids. She was not observed to aspirate, but she also could not clear penetrates despite cues to cough/clear her throat. Attempted to use chin tuck but she physically and/or cognitively could not maintain this position. Also considering her mentation and that her positioning will not be as upright when she is on CRRT, would start with Dys 3 diet and nectar thick liquids. If offering pills that can't be crushed, would offer them whole in puree but would monitor for oral clearance. SLP Visit Diagnosis Dysphagia, pharyngeal phase (R13.13) Attention and concentration deficit following -- Frontal lobe and executive function deficit following -- Impact on safety and function Mild aspiration risk   Treatment Recommendations 01/23/2022 Treatment Recommendations Therapy as outlined in treatment plan below   Prognosis 01/23/2022 Prognosis for Safe Diet Advancement Good Barriers to Reach Goals Cognitive deficits Barriers/Prognosis Comment -- Diet Recommendations 01/23/2022 SLP Diet Recommendations Dysphagia 3 (Mech  soft) solids;Nectar thick liquid Liquid Administration via Cup;Straw Medication Administration Whole meds with puree Compensations Slow rate;Small sips/bites;Minimize environmental distractions Postural Changes Other (Comment)   Other Recommendations 01/23/2022 Recommended Consults -- Oral Care Recommendations Oral care BID Other Recommendations Prohibited food (jello, ice cream, thin soups);Remove water pitcher Follow Up Recommendations Acute inpatient rehab (3hours/day) Assistance recommended at discharge Frequent or constant Supervision/Assistance Functional Status Assessment Patient has had a recent decline in their functional status and demonstrates the ability to make significant improvements in function in a reasonable and predictable amount of time. Frequency and Duration  01/23/2022 Speech Therapy Frequency (ACUTE ONLY) min 2x/week Treatment Duration 2 weeks   Oral  Phase 01/23/2022 Oral Phase Impaired Oral - Pudding Teaspoon -- Oral - Pudding Cup -- Oral - Honey Teaspoon -- Oral - Honey Cup -- Oral - Nectar Teaspoon -- Oral - Nectar Cup -- Oral - Nectar Straw Other (Comment) Oral - Thin Teaspoon WFL Oral - Thin Cup -- Oral - Thin Straw Premature spillage;Other (Comment) Oral - Puree Delayed oral transit Oral - Mech Soft -- Oral - Regular Impaired mastication;Delayed oral transit Oral - Multi-Consistency -- Oral - Pill Reduced posterior propulsion;Lingual/palatal residue Oral Phase - Comment --  Pharyngeal Phase 01/23/2022 Pharyngeal Phase Impaired Pharyngeal- Pudding Teaspoon -- Pharyngeal -- Pharyngeal- Pudding Cup -- Pharyngeal -- Pharyngeal- Honey Teaspoon -- Pharyngeal -- Pharyngeal- Honey Cup -- Pharyngeal -- Pharyngeal- Nectar Teaspoon -- Pharyngeal -- Pharyngeal- Nectar Cup -- Pharyngeal -- Pharyngeal- Nectar Straw Penetration/Aspiration before swallow Pharyngeal Material enters airway, remains ABOVE vocal cords then ejected out Pharyngeal- Thin Teaspoon WFL Pharyngeal -- Pharyngeal- Thin Cup --  Pharyngeal -- Pharyngeal- Thin Straw Penetration/Aspiration before swallow Pharyngeal Material enters airway, CONTACTS cords and not ejected out Pharyngeal- Puree WFL Pharyngeal -- Pharyngeal- Mechanical Soft -- Pharyngeal -- Pharyngeal- Regular WFL Pharyngeal -- Pharyngeal- Multi-consistency -- Pharyngeal -- Pharyngeal- Pill -- Pharyngeal -- Pharyngeal Comment --  Cervical Esophageal Phase  01/23/2022 Cervical Esophageal Phase WFL Pudding Teaspoon -- Pudding Cup -- Honey Teaspoon -- Honey Cup -- Nectar Teaspoon -- Nectar Cup -- Nectar Straw -- Thin Teaspoon -- Thin Cup -- Thin Straw -- Puree -- Mechanical Soft -- Regular -- Multi-consistency -- Pill -- Cervical Esophageal Comment -- [name]., M.A. CCC-SLP Acute Rehabilitation Services Pager [phone] Office [phone] 01/23/2022, 5:15 PM                       Medications:     Scheduled Medications:  aspirin EC  325 mg Oral Daily   Or   aspirin  324 mg Per Tube Daily   bisacodyl  10 mg Oral Daily   Or   bisacodyl  10 mg Rectal Daily   chlorhexidine gluconate (MEDLINE KIT)  15 mL Mouth Rinse BID   Chlorhexidine Gluconate Cloth  6 each Topical Daily   darbepoetin (ARANESP) injection - NON-DIALYSIS  100 mcg Subcutaneous Q Sat-1800   docusate  200 mg Per Tube Daily   doxercalciferol  9 mcg Intravenous Q M,W,F-HD   feeding supplement (PROSource TF)  45 mL Per Tube BID   insulin aspart  0-20 Units Subcutaneous Q4H   insulin aspart  8 Units Subcutaneous Q4H   insulin detemir  20 Units Subcutaneous Q12H   latanoprost  1 drop Both Eyes QHS   macitentan  10 mg Oral Daily   midodrine  10 mg Oral TID WC   multivitamin  1 tablet Per Tube QHS   pantoprazole sodium  40 mg Per Tube Daily   rosuvastatin  20 mg Per Tube QHS   sodium chloride flush  10-40 mL Intracatheter Q12H   sodium chloride flush  3 mL Intravenous Q12H   Thrombi-Pad  1 each Topical Once    Infusions:   prismasol BGK 4/2.5 500 mL/hr at 01/23/22 2316    prismasol BGK 4/2.5 500  mL/hr at 01/23/22 2322   sodium chloride 10 mL/hr at 01/24/22 0700   amiodarone 30 mg/hr (01/24/22 0700)   bivalirudin (ANGIOMAX) infusion 0.5 mg/mL (Non-ACS indications) 0.0135 mg/kg/hr (01/24/22 0700)   epinephrine 3 mcg/min (01/24/22 0700)   feeding supplement (VITAL 1.5 CAL) 1,000 mL (01/24/22 0104)   milrinone 0.25 mcg/kg/min (01/24/22  0700)   norepinephrine (LEVOPHED) Adult infusion 4 mcg/min (01/24/22 0700)   prismasol BGK 4/2.5 1,400 mL/hr at 01/24/22 0444   vasopressin 0.03 Units/min (01/24/22 0700)    PRN Medications: sodium chloride, alteplase, food thickener, heparin, hydrOXYzine, ondansetron (ZOFRAN) IV, oxyCODONE, sodium chloride, sodium chloride flush, sodium chloride flush, traMADol    Patient Profile   Jody Nguyen is a 66 year old with a history of ESRD, PAH, CVA 1997, HTN,  DMII, hyperlipidemia, PE, and MVR in 0263 complicated by ECMO.    Assessment/Plan  1. Post-cardiotomy cardiogenic shock - likely due predominantly to end-stage RV failure. (Required ECMO in 2016 post MVR) - Post op platelets down to 30. Switched to Bival. HIT negative.  - continue pressor support with NE, EPI, milrinone and VP. CO-OX 72%.  -Hold macitentan today.  - prognosis quite poor at this point.  Luiz Blare tomorrow. NPO after MN and hold Tube Feeds.     2. PAH with cor-pulmonale, end-stage - she clearly has mixed PH (predominantly WHO Groups II-III but perhaps contributions from I & IV) - Hold macitentan. Given her hemodynamics  doubt she will have much to gain from further selective pulmonary artery vasodilators but we can use PA cath to help figure that out - Continue epi + milrinone.    3. Severe 3vCAD s/p CABG x3 with closure PFO + TV ring on 01/15/2022 - no current s/s angina - continue ASA/Statin   4. ESRD - currently on CVVHD   5. SVT/possible PAF -- continue IV amio and Bival. HIT negative.    6. H/O PE - 11/2020 VQ : unchanged left lower lobe apical segment PE - VQ 01/02/22  low prob - continue Bival . HIT negative.    6. H/O MV Ring 2016 - stable on echo    7. DM2 - cont SSI   Plan RHC tomorrow. Not sure she could tolerate today. She is agreeable.   Length of Stay: [place], NP  01/24/2022, 7:56 AM  Advanced Heart Failure Team Pager [phone] (M-F; 7a - 5p)  Please contact [facility] for night-coverage after hours (5p -7a ) and weekends on [email or web address]  Agree with above.   Remains very tenuous. On multiple pressors/inotropes. On CVVHD pulling -50. CVP 22. Co-ox 72%  Much more dyspneic today. Denies CP   .General:  Ill-appearing. Dyspneic HEENT: normal Neck: supple. Hard to see JVP  Carotids 2+ bilat; no bruits. No lymphadenopathy or thryomegaly appreciated. Cor: PMI nondisplaced. Regular tachy No rubs, gallops or murmurs. Lungs: crackles Abdomen: obese soft, nontender, nondistended. No hepatosplenomegaly. No bruits or masses. Good bowel sounds. Extremities: no cyanosis, clubbing, rash, 2-3+ edema  RUE AVF. RFV HD cath. LFV TLC Neuro: alert & orientedx3, cranial nerves grossly intact. moves all 4 extremities w/o difficulty. Affect pleasant  She is very tenuous with end-stage RV failure. Requiring multiple pressors inotropes. Plan was for PA cath today from [facility] approach but appears too dyspneic. Will ask Renal if we can pull more fluid today and reassess in am. Watch for worsening respiratory failure. Prognosis guarded.   CRITICAL CARE Performed by: Glori Bickers  Total critical care time: 35 minutes  Critical care time was exclusive of separately billable procedures and treating other patients.  Critical care was necessary to treat or prevent imminent or life-threatening deterioration.  Critical care was time spent personally by me (independent of midlevel providers or residents) on the following activities: development of treatment plan with patient and/or surrogate as well as nursing, discussions  with consultants, evaluation of  patient's response to treatment, examination of patient, obtaining history from patient or surrogate, ordering and performing treatments and interventions, ordering and review of laboratory studies, ordering and review of radiographic studies, pulse oximetry and re-evaluation of patient's condition.  Glori Bickers, MD  9:01 AM

## 2022-01-25 ENCOUNTER — Inpatient Hospital Stay (HOSPITAL_COMMUNITY): Payer: Medicare Other

## 2022-01-25 ENCOUNTER — Inpatient Hospital Stay (HOSPITAL_COMMUNITY): Admission: EM | Disposition: E | Payer: Self-pay | Source: Home / Self Care | Attending: Internal Medicine

## 2022-01-25 ENCOUNTER — Encounter (HOSPITAL_COMMUNITY): Payer: Self-pay | Admitting: Internal Medicine

## 2022-01-25 DIAGNOSIS — R570 Cardiogenic shock: Secondary | ICD-10-CM | POA: Diagnosis not present

## 2022-01-25 DIAGNOSIS — R579 Shock, unspecified: Secondary | ICD-10-CM | POA: Diagnosis not present

## 2022-01-25 DIAGNOSIS — J9601 Acute respiratory failure with hypoxia: Secondary | ICD-10-CM | POA: Diagnosis not present

## 2022-01-25 DIAGNOSIS — L8990 Pressure ulcer of unspecified site, unspecified stage: Secondary | ICD-10-CM | POA: Insufficient documentation

## 2022-01-25 DIAGNOSIS — R072 Precordial pain: Secondary | ICD-10-CM | POA: Diagnosis not present

## 2022-01-25 HISTORY — PX: RIGHT HEART CATH: CATH118263

## 2022-01-25 LAB — GLUCOSE, CAPILLARY
Glucose-Capillary: 133 mg/dL — ABNORMAL HIGH (ref 70–99)
Glucose-Capillary: 175 mg/dL — ABNORMAL HIGH (ref 70–99)
Glucose-Capillary: 178 mg/dL — ABNORMAL HIGH (ref 70–99)
Glucose-Capillary: 183 mg/dL — ABNORMAL HIGH (ref 70–99)
Glucose-Capillary: 226 mg/dL — ABNORMAL HIGH (ref 70–99)

## 2022-01-25 LAB — RENAL FUNCTION PANEL
Albumin: 2.5 g/dL — ABNORMAL LOW (ref 3.5–5.0)
Albumin: 2.6 g/dL — ABNORMAL LOW (ref 3.5–5.0)
Anion gap: 10 (ref 5–15)
Anion gap: 11 (ref 5–15)
BUN: 13 mg/dL (ref 8–23)
BUN: 15 mg/dL (ref 8–23)
CO2: 24 mmol/L (ref 22–32)
CO2: 24 mmol/L (ref 22–32)
Calcium: 9.7 mg/dL (ref 8.9–10.3)
Calcium: 9.4 mg/dL (ref 8.9–10.3)
Chloride: 102 mmol/L (ref 98–111)
Chloride: 101 mmol/L (ref 98–111)
Creatinine, Ser: 1.23 mg/dL — ABNORMAL HIGH (ref 0.44–1.00)
Creatinine, Ser: 1.33 mg/dL — ABNORMAL HIGH (ref 0.44–1.00)
GFR, Estimated: 48 mL/min — ABNORMAL LOW (ref >60)
GFR, Estimated: 44 mL/min — ABNORMAL LOW (ref >60)
Glucose, Bld: 179 mg/dL — ABNORMAL HIGH (ref 70–99)
Glucose, Bld: 245 mg/dL — ABNORMAL HIGH (ref 70–99)
Phosphorus: 2.6 mg/dL (ref 2.5–4.6)
Phosphorus: 3 mg/dL (ref 2.5–4.6)
Potassium: 4.4 mmol/L (ref 3.5–5.1)
Potassium: 4.6 mmol/L (ref 3.5–5.1)
Sodium: 136 mmol/L (ref 135–145)
Sodium: 136 mmol/L (ref 135–145)

## 2022-01-25 LAB — POCT I-STAT EG7
Acid-Base Excess: 3 mmol/L — ABNORMAL HIGH (ref 0.0–2.0)
Acid-Base Excess: 3 mmol/L — ABNORMAL HIGH (ref 0.0–2.0)
Acid-Base Excess: 2 mmol/L (ref 0.0–2.0)
Bicarbonate: 26.1 mmol/L (ref 20.0–28.0)
Bicarbonate: 26.2 mmol/L (ref 20.0–28.0)
Bicarbonate: 25.1 mmol/L (ref 20.0–28.0)
Calcium, Ion: 1.25 mmol/L (ref 1.15–1.40)
Calcium, Ion: 1.28 mmol/L (ref 1.15–1.40)
Calcium, Ion: 1.23 mmol/L (ref 1.15–1.40)
HCT: 24 % — ABNORMAL LOW (ref 36.0–46.0)
HCT: 25 % — ABNORMAL LOW (ref 36.0–46.0)
HCT: 25 % — ABNORMAL LOW (ref 36.0–46.0)
Hemoglobin: 8.2 g/dL — ABNORMAL LOW (ref 12.0–15.0)
Hemoglobin: 8.5 g/dL — ABNORMAL LOW (ref 12.0–15.0)
Hemoglobin: 8.5 g/dL — ABNORMAL LOW (ref 12.0–15.0)
O2 Saturation: 51 %
O2 Saturation: 57 %
O2 Saturation: 59 %
Potassium: 4.3 mmol/L (ref 3.5–5.1)
Potassium: 4.4 mmol/L (ref 3.5–5.1)
Potassium: 4.3 mmol/L (ref 3.5–5.1)
Sodium: 135 mmol/L (ref 135–145)
Sodium: 136 mmol/L (ref 135–145)
Sodium: 136 mmol/L (ref 135–145)
TCO2: 27 mmol/L (ref 22–32)
TCO2: 27 mmol/L (ref 22–32)
TCO2: 26 mmol/L (ref 22–32)
pCO2, Ven: 34.4 mmHg — ABNORMAL LOW (ref 44–60)
pCO2, Ven: 34.5 mmHg — ABNORMAL LOW (ref 44–60)
pCO2, Ven: 33 mmHg — ABNORMAL LOW (ref 44–60)
pH, Ven: 7.487 — ABNORMAL HIGH (ref 7.25–7.43)
pH, Ven: 7.49 — ABNORMAL HIGH (ref 7.25–7.43)
pH, Ven: 7.49 — ABNORMAL HIGH (ref 7.25–7.43)
pO2, Ven: 25 mmHg — CL (ref 32–45)
pO2, Ven: 27 mmHg — CL (ref 32–45)
pO2, Ven: 28 mmHg — CL (ref 32–45)

## 2022-01-25 LAB — CBC WITH DIFFERENTIAL/PLATELET
Abs Immature Granulocytes: 0.09 10*3/uL — ABNORMAL HIGH (ref 0.00–0.07)
Basophils Absolute: 0.1 10*3/uL (ref 0.0–0.1)
Basophils Relative: 1 %
Eosinophils Absolute: 0.6 10*3/uL — ABNORMAL HIGH (ref 0.0–0.5)
Eosinophils Relative: 5 %
HCT: 24 % — ABNORMAL LOW (ref 36.0–46.0)
Hemoglobin: 7.6 g/dL — ABNORMAL LOW (ref 12.0–15.0)
Immature Granulocytes: 1 %
Lymphocytes Relative: 7 %
Lymphs Abs: 0.9 10*3/uL (ref 0.7–4.0)
MCH: 30.8 pg (ref 26.0–34.0)
MCHC: 31.7 g/dL (ref 30.0–36.0)
MCV: 97.2 fL (ref 80.0–100.0)
Monocytes Absolute: 1 10*3/uL (ref 0.1–1.0)
Monocytes Relative: 8 %
Neutro Abs: 10.5 10*3/uL — ABNORMAL HIGH (ref 1.7–7.7)
Neutrophils Relative %: 78 %
Platelets: 165 10*3/uL (ref 150–400)
RBC: 2.47 MIL/uL — ABNORMAL LOW (ref 3.87–5.11)
RDW: 25.2 % — ABNORMAL HIGH (ref 11.5–15.5)
Smear Review: ADEQUATE
WBC: 13.2 10*3/uL — ABNORMAL HIGH (ref 4.0–10.5)
nRBC: 7.4 % — ABNORMAL HIGH (ref 0.0–0.2)

## 2022-01-25 LAB — COOXEMETRY PANEL
Carboxyhemoglobin: 2.7 % — ABNORMAL HIGH (ref 0.5–1.5)
Methemoglobin: <0.7 % (ref 0.0–1.5)
O2 Saturation: 61 %
Total hemoglobin: <6.9 g/dL — CL (ref 12.0–16.0)

## 2022-01-25 LAB — APTT
aPTT: 49 seconds — ABNORMAL HIGH (ref 24–36)
aPTT: 54 seconds — ABNORMAL HIGH (ref 24–36)

## 2022-01-25 LAB — MAGNESIUM: Magnesium: 2.4 mg/dL (ref 1.7–2.4)

## 2022-01-25 SURGERY — RIGHT HEART CATH
Anesthesia: LOCAL

## 2022-01-25 MED ORDER — SODIUM CHLORIDE 0.9 % IV SOLN
INTRAVENOUS | Status: DC | PRN
  Administered 2022-01-25: 10 mL/h via INTRAVENOUS

## 2022-01-25 MED ORDER — SODIUM CHLORIDE 0.9% IV SOLUTION: INTRAVENOUS | Status: DC

## 2022-01-25 MED ORDER — INSULIN ASPART 100 UNIT/ML IJ SOLN
8.0000 [IU] | INTRAMUSCULAR | Status: DC
  Administered 2022-01-26 – 2022-01-28 (×6): 8 [IU] via SUBCUTANEOUS

## 2022-01-25 MED ORDER — SODIUM CHLORIDE 0.9 % IV SOLN: INTRAVENOUS | Status: DC

## 2022-01-25 MED ORDER — MIDODRINE HCL 5 MG PO TABS
15.0000 mg | ORAL_TABLET | Freq: Three times a day (TID) | ORAL | Status: DC
  Administered 2022-01-25 – 2022-01-26 (×2): 15 mg via ORAL
  Filled 2022-01-25 (×2): qty 3

## 2022-01-25 MED ORDER — INSULIN DETEMIR 100 UNIT/ML ~~LOC~~ SOLN
20.0000 [IU] | Freq: Two times a day (BID) | SUBCUTANEOUS | Status: DC
  Administered 2022-01-25 – 2022-01-26 (×3): 20 [IU] via SUBCUTANEOUS
  Filled 2022-01-25 (×6): qty 0.2

## 2022-01-25 MED ORDER — VITAL 1.5 CAL PO LIQD
1000.0000 mL | ORAL | Status: DC
  Administered 2022-01-25: 1000 mL

## 2022-01-25 MED ORDER — HEPARIN (PORCINE) IN NACL 1000-0.9 UT/500ML-% IV SOLN
INTRAVENOUS | Status: AC
  Filled 2022-01-25: qty 1000

## 2022-01-25 MED ORDER — SODIUM CHLORIDE 0.9 % IV SOLN
INTRAVENOUS | Status: DC | PRN
Start: 2022-01-25 — End: 2022-01-25
  Administered 2022-01-25: 10 mL/h via INTRAVENOUS

## 2022-01-25 MED ORDER — LIDOCAINE HCL (PF) 1 % IJ SOLN
INTRAMUSCULAR | Status: AC
  Filled 2022-01-25: qty 30

## 2022-01-25 MED ORDER — SODIUM CHLORIDE 0.9% IV SOLUTION: INTRAVENOUS | Status: DC | PRN

## 2022-01-25 MED ORDER — GERHARDT'S BUTT CREAM
TOPICAL_CREAM | CUTANEOUS | Status: DC | PRN
  Administered 2022-01-26: 1 via TOPICAL
  Filled 2022-01-25: qty 1

## 2022-01-25 MED ORDER — HEPARIN (PORCINE) IN NACL 1000-0.9 UT/500ML-% IV SOLN
INTRAVENOUS | Status: DC | PRN
Start: 2022-01-25 — End: 2022-01-25
  Administered 2022-01-25: 500 mL

## 2022-01-25 MED ORDER — LIDOCAINE HCL (PF) 1 % IJ SOLN
INTRAMUSCULAR | Status: DC | PRN
Start: 2022-01-25 — End: 2022-01-25
  Administered 2022-01-25: 12 mL

## 2022-01-25 SURGICAL SUPPLY — 6 items
CATH SWAN GANZ VIP 7.5F (CATHETERS) ×1 IMPLANT
PACK CARDIAC CATHETERIZATION (CUSTOM PROCEDURE TRAY) ×2 IMPLANT
SHEATH PINNACLE 8F 10CM (SHEATH) ×1 IMPLANT
SLEEVE REPOSITIONING LENGTH 30 (MISCELLANEOUS) ×1 IMPLANT
TRANSDUCER W/STOPCOCK (MISCELLANEOUS) ×2 IMPLANT
WIRE EMERALD 3MM-J .025X260CM (WIRE) ×1 IMPLANT

## 2022-01-25 NOTE — Progress Notes (Addendum)
Advanced Heart Failure Rounding Note  PCP-Cardiologist: [name], MD   Subjective:   2/10 S/P CABG PFO closure and TV ring  with ESRD.   Norepi 2 mcg Epi  mcg 3 Vaso 0.3 Milrinone 0.25 mcg . CVP down 14-15. CO-OX 61%.  Continue amio drip.   On CVVHD pulling 100 per hour. Currently on hold.    Denies SOB.  Objective:   Weight Range: 71.2 kg Body mass index is 31.17 kg/m.   Vital Signs:   Temp:  [97.6 F (36.4 C)-97.8 F (36.6 C)] 97.7 F (36.5 C) (03/01 0400) Pulse Rate:  [87-102] 98 (03/01 0600) Resp:  [11-22] 21 (03/01 0600) BP: (76-105)/(46-72) 104/57 (03/01 0600) SpO2:  [91 %-100 %] 95 % (03/01 0600) Arterial Line BP: (100-123)/(43-62) 105/46 (03/01 0600) Weight:  [71.2 kg] 71.2 kg (03/01 0230) Last BM Date : 01/24/22  Weight change: Filed Weights   01/23/22 0500 01/24/22 0500 02/07/2022 0230  Weight: 76.5 kg 72 kg 71.2 kg    Intake/Output:   Intake/Output Summary (Last 24 hours) at 02/03/2022 0709 Last data filed at 02/12/2022 0600 Gross per 24 hour  Intake 2383.93 ml  Output 4665 ml  Net -2281.07 ml      Physical Exam   CVP 14-15  General:   No resp difficulty HEENT: + cortrak Neck: supple. JVP 11-12 . Carotids 2+ bilat; no bruits. No lymphadenopathy or thryomegaly appreciated. Cor: PMI nondisplaced. Regular rate & rhythm. No rubs, gallops or murmurs. Lungs: clear on room air.  Abdomen: soft, nontender, nondistended. No hepatosplenomegaly. No bruits or masses. Good bowel sounds. Extremities: no cyanosis, clubbing, rash, edema. R fem vein HD cath and L fem art line . RUE AV fistula  Neuro: alert & orientedx3, cranial nerves grossl intact. moves all 4 extremities w/o difficulty. Affect flat   Telemetry   SR 90s personally checked.   EKG    N/A   Labs    CBC Recent Labs    01/24/22 0354 02/18/2022 0415  WBC 15.0* 13.2*  NEUTROABS 11.9* 10.5*  HGB 7.4* 7.6*  HCT 23.8* 24.0*  MCV 96.4 97.2  PLT 163 710   Basic Metabolic  Panel Recent Labs    01/24/22 0354 01/24/22 1545 02/22/2022 0415  NA 134* 135 136  K 4.2 4.2 4.4  CL 102 102 102  CO2 $Re'24 26 24  'lgi$ GLUCOSE 168* 140* 179*  BUN $Re'16 14 13  'dOz$ CREATININE 1.32* 1.18* 1.23*  CALCIUM 9.3 9.5 9.7  MG 2.5*  --  2.4  PHOS 2.3* 2.3* 2.6   Liver Function Tests Recent Labs    01/23/22 0432 01/23/22 1730 01/24/22 1545 02/13/2022 0415  AST 99*  --   --   --   ALT 78*  --   --   --   ALKPHOS 111  --   --   --   BILITOT 0.6  --   --   --   PROT 6.4*  --   --   --   ALBUMIN 2.2*   2.4*   < > 2.5* 2.5*   < > = values in this interval not displayed.   No results for input(s): LIPASE, AMYLASE in the last 72 hours. Cardiac Enzymes No results for input(s): CKTOTAL, CKMB, CKMBINDEX, TROPONINI in the last 72 hours.  BNP: BNP (last 3 results) Recent Labs    12/20/21 2246 01/01/22 0941  BNP 1,753.1* 1,432.4*    ProBNP (last 3 results) No results for input(s): PROBNP in the last 8760  hours.   D-Dimer No results for input(s): DDIMER in the last 72 hours. Hemoglobin A1C No results for input(s): HGBA1C in the last 72 hours. Fasting Lipid Panel No results for input(s): CHOL, HDL, LDLCALC, TRIG, CHOLHDL, LDLDIRECT in the last 72 hours. Thyroid Function Tests No results for input(s): TSH, T4TOTAL, T3FREE, THYROIDAB in the last 72 hours.  Invalid input(s): FREET3  Other results:   Imaging    No results found.   Medications:     Scheduled Medications:  aspirin EC  325 mg Oral Daily   Or   aspirin  324 mg Per Tube Daily   bisacodyl  10 mg Oral Daily   Or   bisacodyl  10 mg Rectal Daily   chlorhexidine gluconate (MEDLINE KIT)  15 mL Mouth Rinse BID   Chlorhexidine Gluconate Cloth  6 each Topical Daily   darbepoetin (ARANESP) injection - NON-DIALYSIS  100 mcg Subcutaneous Q Sat-1800   docusate  200 mg Per Tube Daily   doxercalciferol  9 mcg Intravenous Q M,W,F-HD   feeding supplement (PROSource TF)  45 mL Per Tube BID   insulin aspart  0-20 Units  Subcutaneous Q4H   latanoprost  1 drop Both Eyes QHS   midodrine  10 mg Oral TID WC   multivitamin  1 tablet Per Tube QHS   pantoprazole sodium  40 mg Per Tube Daily   rosuvastatin  20 mg Per Tube QHS   sodium chloride flush  10-40 mL Intracatheter Q12H   sodium chloride flush  3 mL Intravenous Q12H   sodium chloride flush  3 mL Intravenous Q12H   Thrombi-Pad  1 each Topical Once    Infusions:   prismasol BGK 4/2.5 500 mL/hr at 02/21/2022 0626    prismasol BGK 4/2.5 500 mL/hr at 02/13/2022 2536   sodium chloride 10 mL/hr at 02/13/2022 0600   sodium chloride     sodium chloride 10 mL/hr at 02/08/2022 0600   amiodarone 30 mg/hr (02/10/2022 0600)   bivalirudin (ANGIOMAX) infusion 0.5 mg/mL (Non-ACS indications) 0.0135 mg/kg/hr (02/07/2022 0600)   epinephrine 3 mcg/min (01/29/2022 0600)   milrinone 0.25 mcg/kg/min (02/23/2022 0600)   norepinephrine (LEVOPHED) Adult infusion 2 mcg/min (01/31/2022 0600)   prismasol BGK 4/2.5 1,400 mL/hr at 02/23/2022 0331   vasopressin 0.03 Units/min (02/04/2022 0600)    PRN Medications: sodium chloride, sodium chloride, alteplase, food thickener, heparin, hydrOXYzine, ondansetron (ZOFRAN) IV, oxyCODONE, sodium chloride, sodium chloride flush, sodium chloride flush, sodium chloride flush, traMADol    Patient Profile   Ms Leis is a 66 year old with a history of ESRD, PAH, CVA 1997, HTN,  DMII, hyperlipidemia, PE, and MVR in 6440 complicated by ECMO.    Assessment/Plan  1. Post-cardiotomy cardiogenic shock - likely due predominantly to end-stage RV failure. (Required ECMO in 2016 post MVR) - Post op platelets down to 30. Switched to Bival. HIT negative.  - continue pressor support with NE, EPI, milrinone and VP. CO-OX 61.  -- prognosis quite poor at this point.  - RHC today to help sort out hemodynamics.   2. PAH with cor-pulmonale, end-stage - she clearly has mixed PH (predominantly WHO Groups II-III but perhaps contributions from I & IV) - Hold macitentan. Given  her hemodynamics  doubt she will have much to gain from further selective pulmonary artery vasodilators but we can use PA cath to help figure that out - Continue epi + milrinone. - As above RHC today     3. Severe 3vCAD s/p CABG x3 with closure  PFO + TV ring on 01/02/2022 - no current s/s angina - continue ASA/Statin   4. ESRD - currently on CVVHD   5. SVT/possible PAF -- continue IV amio and Bival. HIT negative.    6. H/O PE - 11/2020 VQ : unchanged left lower lobe apical segment PE - VQ 01/02/22 low prob - continue Bival . HIT negative.    6. H/O MV Ring 2016 - stable on echo   RHC today   Length of Stay: 24  Amy Clegg, NP  01/31/2022, 7:09 AM  Advanced Heart Failure Team Pager [phone] (M-F; 7a - 5p)  Please contact [facility] for night-coverage after hours (5p -7a ) and weekends on [email or web address]  Agree with above. Remains on 4 pressors/inotropes. On CVVHD. Breathing much better  RHC today with swan placement  On NE 2 EPi 3 Milrinone 0.25 VP 0.02  Ao = 104/48 (63) RA = 11 RV = 35/10 PA = 53/26 (34) PCW = 17  Fick cardiac output/index = 6.4/3.8 Thermal CO/CI = 5.2/3.1 PVR (Fick) = 2.7 PVR (Therm) = 3.3 SVR (Fick) = 650 SVR (Therm) = 807 Ao sat = 92% PA sat = 58%, 57%   General:  Ill appearing. No resp difficulty HEENT: normal Neck: supple.hard to see JVP . Carotids 2+ bilat; no bruits. No lymphadenopathy or thryomegaly appreciated. Cor: PMI nondisplaced. Regular tachy Lungs: clear Abdomen: obese soft, nontender, nondistended. No hepatosplenomegaly. No bruits or masses. Good bowel sounds. Extremities: no cyanosis, clubbing, rash, 1+ edema RFV HD cath LFV TLC  Neuro: alert & orientedx3, cranial nerves grossly intact. moves all 4 extremities w/o difficulty. Affect pleasant  RHC reveals surprisingly well-compensated hemodynamics on 4 vasopressors/inotropes. Volume status much improved with CVVHD. Would shoot to keep volume even.   Will wean pressors using swan  guidance. Cut milrinone to 0.125. Wean NE next.  Keep VP for now with low SVR.   CRITICAL CARE Performed by: Glori Bickers  Total critical care time: 45 minutes  Critical care time was exclusive of separately billable procedures and treating other patients.  Critical care was necessary to treat or prevent imminent or life-threatening deterioration.  Critical care was time spent personally by me (independent of midlevel providers or residents) on the following activities: development of treatment plan with patient and/or surrogate as well as nursing, discussions with consultants, evaluation of patient's response to treatment, examination of patient, obtaining history from patient or surrogate, ordering and performing treatments and interventions, ordering and review of laboratory studies, ordering and review of radiographic studies, pulse oximetry and re-evaluation of patient's condition.  Glori Bickers, MD  8:50 AM

## 2022-01-25 NOTE — Progress Notes (Signed)
?[facility] ?Progress Note  ? ?Assessment/ Plan:   ?OP HD: MWF [facility] ?3:15h   450/A1.5   75.5kg   2K/2Ca bath  P2   AVF  Hep  2500 ?- Hectoral 42mg IV q HD ?- Sensipar 932mPO q HD ?- Mircera 15068mIV q 2 weeks (ordered, not yet given. Last Mircera 100 on 12/07/21) ?  ?  ?  ?Assessment/Plan:  ? CAD s/p CABG x3, TR sp TVR:  per CT surgery  ? ESRD:  normally MWF schedule[facility] started 2/11. Did not tolerate CRRT holiday due to ^wob/ hypoxia. Continue CRRT--> once appropriate to switch to IHD will likely need to do it the very next day to maintain volume--> however this is a long way off --> keep even today to help wean milrinone ?Shock - cardiogenic and septic. Has RV failure- RHC 02/19/2022--> more well-compensated hemodynamics.  Weaning support per AHF ?Morganella bacteremia -  s/p Zosyn ? Anemia CKD: Sq darbe 100ug weekly on Saturday. 1st 2/25. ? Metabolic bone disease: resume home binders and sensipar when taking PO. Cont IV vdra.  ? Infiltrated AVF - AVF infiltrated on 2/7. Post-op AVF is nonfunctional, VVS aware. [facility].  ?[facility]  [facility]--> Has RV failure now ? T2DM-per primary service ? Hx of MV replacement - in 2016 ? ?Subjective:   ? ?[facility] surprisingly good hemodynamics  Aggressive UF with CRRT yesterday, negative nearly 4L, net neg 2.4.  Plan to wean inotropes.    ? ?Objective:   ?BP 91/60   Pulse (!) 0   Temp 97.7 ?F (36.5 ?C) (Oral)   Resp 20   Ht 4' 11.5" (1.511 m)   Wt 71.2 kg   SpO2 (!) 89%   BMI 31.17 kg/m?  ? ?Physical Exam: ?[id number]:[id number] ?CVS: tachy ?Resp: coarse, much more comfortable today ?Abd: soft ?Ext: 1+ anasarca ?ACCESS: nontunneled HD cath R fem ? ?Labs: ?BMET ?Recent Labs  ?Lab 01/22/22 ?0444 01/22/22 ?1556 01/23/22 ?0432 01/23/22 ?1730 01/24/22 ?0354 01/24/22 ?1545 02/06/2022 ?0415  ?NA 136 133* 135 135 134* 135 136  ?K 4.3 4.4 4.0 4.5 4.2 4.2 4.4  ?CL 101 98 101 98 102 102  102  ?CO2 _0 ?GLUCOSE 207* 243* 209* 227* 168* 140* 179*  ?BUN _1 ?CREATININE 1.23* 1.25* 1.30* 1.46* 1.32* 1.18* 1.23*  ?CALCIUM 9.4 9.4 9.3 9.7 9.3 9.5 9.7  ?PHOS 2.3* 1.8* 3.1 3.3 2.3* 2.3* 2.6  ? ?CBC ?Recent Labs  ?Lab 01/22/22 ?0440569/27/23 ?0437948/28/23 ?0354 02/14/2022 ?0410165WBC 20.8* 18.0* 15.0* 13.2*  ?NEUTROABS 16.7* 15.0* 11.9* 10.5*  ?HGB 8.0* 7.6* 7.4* 7.6*  ?HCT 25.3* 24.2* 23.8* 24.0*  ?MCV 95.1 96.0 96.4 97.2  ?PLT 187 171 163 165  ? ? ?  ?Medications:   ? ? aspirin EC  325 mg Oral Daily  ? Or  ? aspirin  324 mg Per Tube Daily  ? bisacodyl  10 mg Oral Daily  ? Or  ? bisacodyl  10 mg Rectal Daily  ? chlorhexidine gluconate (MEDLINE KIT)  15 mL Mouth Rinse BID  ? Chlorhexidine Gluconate Cloth  6 each Topical Daily  ? darbepoetin (ARANESP) injection - NON-DIALYSIS  100 mcg Subcutaneous Q Sat-1800  ? docusate  200 mg Per Tube Daily  ? doxercalciferol  9 mcg Intravenous Q M,W,F-HD  ? feeding supplement (  PROSource TF)  45 mL Per Tube BID  ? insulin aspart  0-20 Units Subcutaneous Q4H  ? insulin detemir  20 Units Subcutaneous BID  ? latanoprost  1 drop Both Eyes QHS  ? midodrine  10 mg Oral TID WC  ? multivitamin  1 tablet Per Tube QHS  ? pantoprazole sodium  40 mg Per Tube Daily  ? rosuvastatin  20 mg Per Tube QHS  ? sodium chloride flush  10-40 mL Intracatheter Q12H  ? sodium chloride flush  3 mL Intravenous Q12H  ? Thrombi-Pad  1 each Topical Once  ? ? ? ?Madelon Lips, MD ?02/15/2022, 10:54 AM   ?

## 2022-01-25 NOTE — Progress Notes (Signed)
Nutrition Follow-up ? ?DOCUMENTATION CODES:  ? ?Not applicable ? ?INTERVENTION:  ? ?Recommend continuing TF via Cortrak until pt demonstrating adequate po intake ? ?Tube Feeding via Cortrak:  ?Vital 1.5 to 50 ml/hr ?Pro-Source TF 45 mL BID ?This provides 1880 kcals, 103 g of protein and 912 mL of free water ?  ?Continue Renal MVI daily ? ?Recommend changing scheduled bowel regimen to prn only ?  ? ?NUTRITION DIAGNOSIS:  ? ?Inadequate oral intake related to inability to eat as evidenced by NPO status. ? ?Being addressed via TF  ? ?GOAL:  ? ?Patient will meet greater than or equal to 90% of their needs ? ?Met ? ?MONITOR:  ? ?Diet advancement, Labs, Weight trends, TF tolerance, Skin, I & O's ? ?REASON FOR ASSESSMENT:  ? ?Consult ?Enteral/tube feeding initiation and management ? ?ASSESSMENT:  ? ?66 y.o. female with history of HTN, DM type 2, HKD, pulmonary HTN, ESRD on HD, CHF, hx CVA, and vitamin D deficiency, presented to ED with radiating chest pain, SOB, and nausea. ? ?2/07 Left heart catherization, showed severe coronary calcification and multivessel CAD. ?2/10  CABG x 3, TVR ?2/11  CRRT initiated ?2/13 TF initiated ?2/14 TF on  hold for emesis ?2/15 cortrak placed (gastric), TF resumed ?2/16 extubated ?2/27 Diet advanced to Dysphagia 3, Nectar post MBS ? ?RHC today. Remains on 4 pressors/inotropes ?Pt remains on CRRT-tolerated UF 4 L yesterday with net neg 2.4 L ? ?Tolerating Vital 1.5 at 50 ml/hr via Cortrak ?NPO currently for procedure. No recorded po intake post diet advancement on 2/27 ? ?Outpatient EDW 75.5 kg; current wt 71.2 kg ? ?Newly diagnosed stage II PI to sacrum ? ?Rectal tube in place with liquid stool; pt remains on colace, dulcolax ? ?Labs: reviewed ?Meds: aranesp, hectorol, Rena-vite ? ?Diet Order:   ?Diet Order   ? ?       ?  Diet NPO time specified Except for: Sips with Meds  Diet effective now       ?  ? ?  ?  ? ?  ? ? ?EDUCATION NEEDS:  ? ?Not appropriate for education at this time ? ?Skin:   Skin Assessment: Skin Integrity Issues: ?Skin Integrity Issues:: Stage II ?Stage II: rectum ?Incisions: closed chest, rt leg ? ?Last BM:  3/1 ? ?Height:  ? ?Ht Readings from Last 1 Encounters:  ?01/01/22 4' 11.5" (1.511 m)  ? ? ?Weight:  ? ?Wt Readings from Last 1 Encounters:  ?01/31/2022 71.2 kg  ? ? ?Ideal Body Weight:  45.5 kg ? ?BMI:  Body mass index is 31.17 kg/m?. ? ?Estimated Nutritional Needs:  ? ?Kcal:  1800-2000 kcals ? ?Protein:  90-105 grams ? ?Fluid:  1L+UOP ? ? ? ?Kerman Passey MS, RDN, LDN, CNSC ?Registered Dietitian III ?Clinical Nutrition ?RD Pager and On-Call Pager Number Located in [place]  ? ?

## 2022-01-25 NOTE — Progress Notes (Signed)
Patient ID: Jody Nguyen, female   DOB: 1955/12/07, 66 y.o.   MRN: [id number] ?TCTS Evening Rounds: ? ?Hemodynamically stable  ? ?CI 2.7 on epi 7.5, NE 5, Vaso 0.04.  milrinone turned off today.  CVP 11, PA 47/25, CI 2.7 ? ?Remains on CRRT ? ?Sats 100% on Saxis. ?

## 2022-01-25 NOTE — Progress Notes (Signed)
Day of Surgery Procedure(s) (LRB): ?RIGHT HEART CATH (N/A) ?Subjective: ?Back from [place] ? ?Objective: ?Vital signs in last 24 hours: ?Temp:  [97.6 ?F (36.4 ?C)-97.8 ?F (36.6 ?C)] 97.7 ?F (36.5 ?C) (03/01 0400) ?Pulse Rate:  [0-111] 0 (03/01 0851) ?Cardiac Rhythm: Normal sinus rhythm (03/01 0400) ?Resp:  [11-27] 20 (03/01 0846) ?BP: (76-105)/(50-72) 91/60 (03/01 0846) ?SpO2:  [89 %-100 %] 89 % (03/01 0846) ?Arterial Line BP: (100-123)/(43-56) 107/47 (03/01 0700) ?Weight:  [71.2 kg] 71.2 kg (03/01 0230) ? ?Hemodynamic parameters for last 24 hours: ?CVP:  [10 mmHg-23 mmHg] 18 mmHg ? ?Intake/Output from previous day: ?02/28 0701 - 03/01 0700 ?In: 2443.2 [I.V.:1193.2; NG/GT:1250] ?Out: 8099 [Stool:600] ?Intake/Output this shift: ?Total I/O ?In: 37.5 [I.V.:37.5] ?Out: -  ? ?General appearance: still a little sedated ?Neurologic: nonfocal ?Heart: regular rate and rhythm ?Lungs: clear to auscultation bilaterally ?Wound: clean and dry ? ?Lab Results: ?Recent Labs  ?  01/24/22 ?0354 02/03/2022 ?8338  ?WBC 15.0* 13.2*  ?HGB 7.4* 7.6*  ?HCT 23.8* 24.0*  ?PLT 163 165  ? ?BMET:  ?Recent Labs  ?  01/24/22 ?1545 02/09/2022 ?0415  ?NA 135 136  ?K 4.2 4.4  ?CL 102 102  ?CO2 26 24  ?GLUCOSE 140* 179*  ?BUN 14 13  ?CREATININE 1.18* 1.23*  ?CALCIUM 9.5 9.7  ?  ?PT/INR: No results for input(s): LABPROT, INR in the last 72 hours. ?ABG ?   ?Component Value Date/Time  ? PHART 7.425 01/20/2022 1944  ? HCO3 23.6 01/20/2022 1944  ? TCO2 25 01/20/2022 1944  ? ACIDBASEDEF 1.0 01/20/2022 1944  ? O2SAT 61 02/12/2022 0418  ? ?CBG (last 3)  ?Recent Labs  ?  01/24/22 ?1938 01/24/22 ?2325 01/30/2022 ?0414  ?GLUCAP 109* 124* 183*  ? ? ?Assessment/Plan: ?S/P Procedure(s) (LRB): ?RIGHT HEART CATH (N/A) ?- ?RHC results reviewed- good cardiac index, PA elevated but only about 1/2 systemic ?Cv- drips as outlined by Dr. Haroldine Laws ?RESP_ stable ?RENAL- resume CVVH ?ENDO- CBG OK ?GI- tolerating TF ?Deconditioning- PT ? ? LOS: 24 days  ? ? ?[name] ?01/26/2022 ? ? ?

## 2022-01-25 NOTE — Interval H&P Note (Signed)
History and Physical Interval Note: ? ?01/29/2022 ?8:38 AM ? ?Jody Nguyen  has presented today for surgery, with the diagnosis of heart failure.  The various methods of treatment have been discussed with the patient and family. After consideration of risks, benefits and other options for treatment, the patient has consented to  Procedure(s): ?RIGHT HEART CATH (N/A) as a surgical intervention.  The patient's history has been reviewed, patient examined, no change in status, stable for surgery.  I have reviewed the patient's chart and labs.  Questions were answered to the patient's satisfaction.   ? ? ?[name] ? ? ?

## 2022-01-25 NOTE — Progress Notes (Signed)
ANTICOAGULATION CONSULT NOTE ? ?Pharmacy Consult for bivalirudin ?Indication: history of pulmonary embolus 2021 ? ?Allergies  ?Allergen Reactions  ? Minoxidil Other (See Comments)  ?  Pericardial effusion  ? Lipitor [Atorvastatin] Other (See Comments)  ?  MYALGIAS > "pain in legs" ?Tolerates rosuvastatin  ? Morphine And Related Itching  ? Ace Inhibitors Other (See Comments)  ?  REACTION: "not sure...think it made me drowsy all the time"  ? ? ?Patient Measurements: ?Height: 4' 11.5" (151.1 cm) ?Weight: 71.2 kg (156 lb 15.5 oz) ?IBW/kg (Calculated) : 44.35 ? ?Vital Signs: ?Temp: 98.6 ?F (37 ?C) (03/01 1500) ?Temp Source: Oral (03/01 0400) ?BP: 85/52 (03/01 1400) ?Pulse Rate: 101 (03/01 1500) ? ?Labs: ?Recent Labs  ?  01/23/22 ?0432 01/23/22 ?1730 01/24/22 ?0354 01/24/22 ?1545 02/12/2022 ?0415 02/07/2022 ?1245 02/13/2022 ?8099 02/22/2022 ?1420  ?HGB 7.6*  --  7.4*  --  7.6* 8.2* 8.5*  --   ?HCT 24.2*  --  23.8*  --  24.0* 24.0* 25.0*  --   ?PLT 171  --  163  --  165  --   --   --   ?APTT 54*  --  46*  --  49*  --   --  54*  ?CREATININE 1.30*   < > 1.32* 1.18* 1.23*  --   --   --   ? < > = values in this interval not displayed.  ? ? ? ?Estimated Creatinine Clearance: 39.1 mL/min (A) (by C-G formula based on SCr of 1.23 mg/dL (H)). ? ? ?Assessment: ?66 yo female on chronic apixaban for hx of PE 2021. Apixaban has been held for surgery.  Pharmacy asked to resume anticoagulation 2/15 with bivalirudin.  HIT antibody and SRA both negative.  Platelet count now recovered 50>208>160s. ? ?aPTT now therapeutic at 54. Pltc stable in the 160s, H/H low but stable.  ? ?Per RN, no issues with bleeding. ? ?Goal of Therapy:  ?aPTT goal ~50-70sec - target lower end given low platelet count/risk of post-operative bleeding ?Monitor platelets by anticoagulation protocol: Yes ?  ?Plan:  ?-Continue bival to 0.016 mg/kg/hr ?-aPTT and CBC daily while on bival ?-Monitor for s/x of bleeding ? ?Cathrine Muster, PharmD ?PGY2 Cardiology Pharmacy  Resident ?Phone: [phone] ?01/26/2022  3:22 PM ? ?Please check [email or web address] for unit-specific pharmacy phone numbers. ? ? ? ? ? ? ?

## 2022-01-25 NOTE — Progress Notes (Signed)
eLink Physician-Brief Progress Note ?Patient Name: MURIAH HARSHA ?DOB: Apr 12, 1956 ?MRN: [id number] ? ? ?Date of Service ? 02/21/2022  ?HPI/Events of Note ? Patient on TF. Requesting scheduled insulin coverage when patient is at TF goal  ?eICU Interventions ? Restarted aspart 8U q4h starting at 4am when patient is at TF goal  ? ? ? ?Intervention Category ?Minor Interventions: Routine modifications to care plan (e.g. PRN medications for pain, fever) ? ?Orlandria Kissner Rodman Pickle ?02/14/2022, 7:57 PM ?

## 2022-01-25 NOTE — Progress Notes (Signed)
ANTICOAGULATION CONSULT NOTE ? ?Pharmacy Consult for bivalirudin ?Indication: history of pulmonary embolus 2021 ? ?Allergies  ?Allergen Reactions  ? Minoxidil Other (See Comments)  ?  Pericardial effusion  ? Lipitor [Atorvastatin] Other (See Comments)  ?  MYALGIAS > "pain in legs" ?Tolerates rosuvastatin  ? Morphine And Related Itching  ? Ace Inhibitors Other (See Comments)  ?  REACTION: "not sure...think it made me drowsy all the time"  ? ? ?Patient Measurements: ?Height: 4' 11.5" (151.1 cm) ?Weight: 71.2 kg (156 lb 15.5 oz) ?IBW/kg (Calculated) : 44.35 ? ?Vital Signs: ?Temp: 97.7 ?F (36.5 ?C) (03/01 0400) ?Temp Source: Oral (03/01 0400) ?BP: 104/57 (03/01 0600) ?Pulse Rate: 98 (03/01 0600) ? ?Labs: ?Recent Labs  ?  01/23/22 ?0432 01/23/22 ?1730 01/24/22 ?0354 01/24/22 ?1545 02/08/2022 ?0415  ?HGB 7.6*  --  7.4*  --  7.6*  ?HCT 24.2*  --  23.8*  --  24.0*  ?PLT 171  --  163  --  165  ?APTT 54*  --  46*  --  49*  ?CREATININE 1.30*   < > 1.32* 1.18* 1.23*  ? < > = values in this interval not displayed.  ? ? ? ?Estimated Creatinine Clearance: 39.1 mL/min (A) (by C-G formula based on SCr of 1.23 mg/dL (H)). ? ? ?Assessment: ?66 yo female on chronic apixaban for hx of PE 2021. Apixaban has been held for surgery.  Pharmacy asked to resume anticoagulation 2/15 with bivalirudin.  HIT antibody and SRA both negative.  Platelet count now recovered 50>208>160s. ? ?aPTT this morning is slightly below goal at 49 seconds. Pltc stable in the 160s, H/H low but stable.  ? ?Per RN, no issues with bleeding. ? ?Goal of Therapy:  ?aPTT goal ~50-70sec - target lower end given low platelet count/risk of post-operative bleeding ?Monitor platelets by anticoagulation protocol: Yes ?  ?Plan:  ?-Increase bival to 0.016 mg/kg/hr after cath ?-4 hour aPTT after retuns from cath ?-aPTT and CBC daily while on bival ?-Monitor for s/x of bleeding ? ?Cathrine Muster, PharmD ?PGY2 Cardiology Pharmacy Resident ?Phone: [phone] ?01/31/2022  6:23  AM ? ?Please check [email or web address] for unit-specific pharmacy phone numbers. ? ? ? ? ? ? ?

## 2022-01-25 NOTE — Progress Notes (Signed)
NAME:  Jody Nguyen, MRN:  [id number], DOB:  05-31-1956, LOS: 24 ADMISSION DATE:  01/04/2022, CONSULTATION DATE:  01/17/2022 REFERRING MD:  Marlowe Sax - TRH, CHIEF COMPLAINT:  SOB   History of Present Illness:  66 year old woman who presented to [facility] ED 2/4 with CP radiating to her arm. PMHx significant for  ESRD (on HD), non-obstructive CAD, suspected pulmonary hypertension, PE (on Eliquis). Recent ED presentation 2/1 for similar symptoms (troponin 100s). F/u with Cardiology as an outpatient 2/2 and CP was felt to be related to volume shift (occurring during dialysis).   On ED presentation 2/4, patient reported episodic chest pain, non-exertional and intermittent x 1 week with associated nausea and SOB. Non-specific EKG changes. Cardiology consulted, STEMI ruled out. Troponin elevated at 200. Placed on [place] for SpO2 80s. Admitted to [facility] for observation with concern for PE and started on heparin gtt. VQ with low probability for PE. Echo 2/19 demonstrated severe RV/RA enlargement, severely reduced RV function and septal bowing; felt to be consistent with chronic process such as severe pulmonary HTN.    PCCM has been consulted in this setting.  Pertinent Medical History:  ESRD PE Pulmonary HTN  CAD CVA HLD HTN S/p MV repair  Pericardial effusion r/t minoxidil  Diastolic HF DM2  Significant Hospital Events: Including procedures, antibiotic start and stop dates in addition to other pertinent events   2/4 ED presentation with recurring chest pain -- STEMI ruled out. Stable, mildly hypoxic and placed on [place] 2/5 Admit to [facility] for obvs. Concern for PE, ECHO performed by cardiology reportedly shows RV failure, but felt more consistent with chronic process like severe pulmHTN. Awaiting VQ scan. Pulm consulted  2/7 Cardiac cath. 3 vessel dz. CABG rec 2/10 CABG, TVR 2/11 Continued vasoplegic shock with very high vasopressor requirements  2/12 Given methylene blue x 2 2/13 Chest tube removed 2/14 JP  removed 2/15 pacing wires removed 2/16 milrinone stopped, extubated 2/17 Zosyn for GNR bacteremia-> Morganella morganii 2/18 Arterial line, central line, vascath all removed and replaced. 2/19 Milrinone back on. Co-ox improved. ECHO Left ventricular ejection fraction, by estimation, is 50 to 55%. The left ventricle has low normal function. No regional WM abnormality. Right Ventricle: Ventricular septum is flattened in systole and diastole  suggesting RV pressure and volume overload. The right ventricular size is  severely enlarged. Right vetricular wall thickness was not well  visualized. Right ventricular systolic  function is severely reduced. There is moderately elevated pulmonary  artery systolic pressure 2/13 still high pressor needs. CRRT stopped. Having intermittent bouts of tachycardia 2/21 Hemodynamics better. Weaning Epi. Still on Norepi and inotropic support. Supported on BIPAP over night and tolerated well. On am rounds more lethargic w/ generalized pain everywhere. Co-ox > 70. Glucose poorly controlled. 2/22 Escalating pressor requirements (NE, Epi, Vaso). Continues on milrinone. Afib with RVR overnight, now with persistent AF (on amio, bival). Co-ox 73%. Hgb 7.3. WBC uptrending (25 from 20), Cr uptrending, CRRT restarted. Tolerating BiPAP. 2/23 Improving pressor needs. Stable Epi/Vaso, downtitrating NE. Milrinone 0.25. Persistent AF, rate controlled. Co-ox 65.7%. Ongoing CRRT. BiPAP QHS. 2/24 Continued decreasing pressor needs, stable epi/vaso, downtitrating Levo.  Milrinone 0.25.  Intermittent A-fib, in and out of NSR, rates 90s.  Co-ox 76.1%.  Ongoing CRRT, did not utilize BiPAP overnight. FEES with SLP. 2/25 confused.  Pressor requirements down some.  Got 1 unit of blood for hemoglobin of 7, Co. ox dropped. Vasopressin decreased to 0.03 units.   Interim History / Subjective:  RHC this morning.  Objective:  Blood pressure (!) 82/57, pulse 89, temperature (!) 97.3 F (36.3 C), resp.  rate (!) 21, height 4' 11.5" (1.511 m), weight 71.2 kg, SpO2 93 %. PAP: (51-53)/(27-30) 51/27 CVP:  [10 mmHg-23 mmHg] 15 mmHg      Intake/Output Summary (Last 24 hours) at 02/12/2022 1349 Last data filed at 01/26/2022 1300 Gross per 24 hour  Intake 1806.48 ml  Output 4014 ml  Net -2207.52 ml    Filed Weights   01/23/22 0500 01/24/22 0500 02/16/2022 0230  Weight: 76.5 kg 72 kg 71.2 kg   Physical Examination: General: chronically ill appearing woman lying in bed in NAD HEENT: [place]/AT, eyes anicteric Pulmonary: breathing comfortably on [place], mild tachypnea, no accessory muscle use Cardiac: S1S2, RRR Abdomen obese, soft, NT Extremities: minimal LE edema, no cyanosis Neuro: awake, alert, answering questions appropriately in short phrases. Moving extremities but globally weak. Derm: no diffuse rashes, warm & dry  Coox 61% Na+  136 BUN 13 Cr 1.23 Phos 2.6 WBC 13.2 H/H 7.6/24.0  WF records reviewed>   RHC 04/2019: RA 10, PA 65/31 (43), PCWP 18 (v-20), BP 187/110, PVR 5, CI 2.83 RHC 09/2019 (done on a non HD day) PA 55/25 (36), PCWP 18, BP 152/93 (113), PA sat 81, PVR 3.96 & 3.52, CI 2.69 by Fick and 3.02 by Thermo  09/06/2021 RHC: RA 8, RV 52/3 (8), PA 50/23 (32) PCWP 21  Fick: CO 5.8, CI 3.2 Thermodilution: CO 3.97, CI 2.33  Assessment & Plan:  Principal Problem:   Chest pain, Multivessel CAD Active Problems:   Hyperlipidemia associated with type 2 diabetes mellitus (HCC)   Anemia of chronic renal failure   Type 2 diabetes mellitus (HCC)   Obesity (BMI 30-39.9)   Pulmonary hypertension (HCC)   Chronic combined systolic and diastolic heart failure (HCC)   Atrial fibrillation (HCC)   End stage renal disease on dialysis (HCC)   History of stroke   QT prolongation   SVT (supraventricular tachycardia) (HCC)   Coronary artery disease involving native coronary artery of native heart with unstable angina pectoris (HCC)   Hyponatremia   GERD (gastroesophageal reflux disease)    Septic shock (HCC)   Gram-negative bacteremia   Hx of CABG   S/P TVR (tricuspid valve repair)   Cardiogenic shock (HCC)   Tachycardia   S/P CABG (coronary artery bypass graft)   Metabolic encephalopathy   Acute hypoxemic respiratory failure (HCC)   Pressure injury of skin  CAD with TR, s/p CABG x 3 and tricuspid repair 2/10 Acute-on-chronic HFrEF, acute right heart failure> has had some recovery of LV function, but RV function is severely reduced.  Despite controlled oxygenation, I am concerned that her cardiac output is limited by her RV failure due to pulmonary hypertension that remains uncontrolled off of her maintenance medications, although she may not have disease that will be amenable to treatment with pulmonary vasodilators. Etiology of PH unknown; per [facility] records-- has WHO group II and I (2/2 ESRD). 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 PFTs with chronic restriction potentially related to obesity. -RHC this morning; Swan ganz catheter placed -Con't holding pulmonary vasodilators. -Con't bival -wean NE first, con't epinephrine and vasopressin. Reduced milrinone dosing per cardiology. -May need midodrine -Not a candidate for guideline directed medical therapy or beta-blocker at this point due to ongoing hypotension and need for inotropic support --daily aspirin and statin -  Appreciate Cardiology's management  Shock-concern that this is mostly cardiogenic at this point.  Lines have been changed out and she has remained on broad-spectrum antibiotics, making septic shock less likely due to ongoing persistent source of her shock.  Vasoplegia should have been resolved by now. -holding pulm vasodilators - Continue vaso, epi. Weaning norepi, reducing milrinone -monitor off antibiotics  Afib, persistent; RVR resolved.  Back in sinus rhythm today. Afib with RVR noted 2/21PM with  rates into 150s. -Continue amiodarone. Con't IV amio until transitioning off positive inotropes and chronotropes. -tele monitoring -monitor and replete electrolytes PRN -Bivalirudin for anticoagulation  Acute respiratory failure with hypoxia, suspected component of PAH.  Most recent chest x-ray on 2/25 not commencing of-supplemental oxygen to maintain SPO2 greater than 90%. - supplemental O2 as needed to maintain SpO2 >90% -volume removal with CRRT- remain even today - pulm hygiene  ESRD on HD Thrombosed AVF -Con't CRRT per Nephro -Eventually will need tunneled dialysis catheter-- should ideally be more stable for this  Acute metabolic encephalopathy- likely ICU delirium vs less likely sepsis.  -delirium precautions, avoid deliriogenic medications -Con't CRRT  DM2 w/ hyperglycemia, uncontrolled Con't Levemir 20 units bid; may need to increase if still needing TF with liberalized diet -resistant scale SSI -goal BG 140-180 -con't TF coverage insulin; if transitioning to adequate oral intake we can switch to mealtime insulin, but so far TF still in use.  Anemia of critical illness and renal failure, losing blood frequently in CRRT filters -transfuse for Hb<7 or hemodynamically significant bleeding -EPO per nephro  Thrombocytopenia -monitor, no need for transfusion  Dysphagia At risk for malnutrition -Appreciate SLP's management; dysphagia 3, nectar thick. Can adjust based on their recommendations. -Con't TF until able to ensure adequate PO intake to meet her needs. RD following.    Best Practice: (right click and "Reselect all SmartList Selections" daily)   Diet/type: tubefeeds  , dysphagia 3 DVT prophylaxis: other- bivalirudin GI prophylaxis: PPI Lines: Central line, Dialysis Catheter, Arterial Line, and yes and it is still needed  Foley:  N/A Code Status:  full code Last date of multidisciplinary goals of care discussion [Family updated 3/1 at bedside]    This  patient is critically ill with multiple organ system failure which requires frequent high complexity decision making, assessment, support, evaluation, and titration of therapies. This was completed through the application of advanced monitoring technologies and extensive interpretation of multiple databases. During this encounter critical care time was devoted to patient care services described in this note for 34 minutes.  Julian Hy, DO 02/15/2022 2:16 PM [facility]

## 2022-01-25 DEATH — deceased

## 2022-01-26 DIAGNOSIS — R570 Cardiogenic shock: Secondary | ICD-10-CM | POA: Diagnosis not present

## 2022-01-26 DIAGNOSIS — R072 Precordial pain: Secondary | ICD-10-CM | POA: Diagnosis not present

## 2022-01-26 DIAGNOSIS — R579 Shock, unspecified: Secondary | ICD-10-CM | POA: Diagnosis not present

## 2022-01-26 DIAGNOSIS — J9601 Acute respiratory failure with hypoxia: Secondary | ICD-10-CM | POA: Diagnosis not present

## 2022-01-26 DIAGNOSIS — N186 End stage renal disease: Secondary | ICD-10-CM | POA: Diagnosis not present

## 2022-01-26 LAB — CBC WITH DIFFERENTIAL/PLATELET
Abs Immature Granulocytes: 0.05 10*3/uL (ref 0.00–0.07)
Basophils Absolute: 0.1 10*3/uL (ref 0.0–0.1)
Basophils Relative: 1 %
Eosinophils Absolute: 0.8 10*3/uL — ABNORMAL HIGH (ref 0.0–0.5)
Eosinophils Relative: 7 %
HCT: 25.8 % — ABNORMAL LOW (ref 36.0–46.0)
Hemoglobin: 7.8 g/dL — ABNORMAL LOW (ref 12.0–15.0)
Immature Granulocytes: 0 %
Lymphocytes Relative: 7 %
Lymphs Abs: 0.9 10*3/uL (ref 0.7–4.0)
MCH: 30.2 pg (ref 26.0–34.0)
MCHC: 30.2 g/dL (ref 30.0–36.0)
MCV: 100 fL (ref 80.0–100.0)
Monocytes Absolute: 0.9 10*3/uL (ref 0.1–1.0)
Monocytes Relative: 7 %
Neutro Abs: 9.9 10*3/uL — ABNORMAL HIGH (ref 1.7–7.7)
Neutrophils Relative %: 78 %
Platelets: 156 10*3/uL (ref 150–400)
RBC: 2.58 MIL/uL — ABNORMAL LOW (ref 3.87–5.11)
RDW: 25.6 % — ABNORMAL HIGH (ref 11.5–15.5)
WBC: 12.6 10*3/uL — ABNORMAL HIGH (ref 4.0–10.5)
nRBC: 5.5 % — ABNORMAL HIGH (ref 0.0–0.2)

## 2022-01-26 LAB — RENAL FUNCTION PANEL
Albumin: 2.6 g/dL — ABNORMAL LOW (ref 3.5–5.0)
Albumin: 2.5 g/dL — ABNORMAL LOW (ref 3.5–5.0)
Anion gap: 10 (ref 5–15)
Anion gap: 8 (ref 5–15)
BUN: 13 mg/dL (ref 8–23)
BUN: 13 mg/dL (ref 8–23)
CO2: 24 mmol/L (ref 22–32)
CO2: 27 mmol/L (ref 22–32)
Calcium: 9.4 mg/dL (ref 8.9–10.3)
Calcium: 9.5 mg/dL (ref 8.9–10.3)
Chloride: 100 mmol/L (ref 98–111)
Chloride: 103 mmol/L (ref 98–111)
Creatinine, Ser: 1.24 mg/dL — ABNORMAL HIGH (ref 0.44–1.00)
Creatinine, Ser: 1.21 mg/dL — ABNORMAL HIGH (ref 0.44–1.00)
GFR, Estimated: 48 mL/min — ABNORMAL LOW (ref >60)
GFR, Estimated: 49 mL/min — ABNORMAL LOW (ref >60)
Glucose, Bld: 266 mg/dL — ABNORMAL HIGH (ref 70–99)
Glucose, Bld: 80 mg/dL (ref 70–99)
Phosphorus: 3 mg/dL (ref 2.5–4.6)
Phosphorus: 2.5 mg/dL (ref 2.5–4.6)
Potassium: 4.6 mmol/L (ref 3.5–5.1)
Potassium: 4.2 mmol/L (ref 3.5–5.1)
Sodium: 134 mmol/L — ABNORMAL LOW (ref 135–145)
Sodium: 138 mmol/L (ref 135–145)

## 2022-01-26 LAB — APTT: aPTT: 53 seconds — ABNORMAL HIGH (ref 24–36)

## 2022-01-26 LAB — GLUCOSE, CAPILLARY
Glucose-Capillary: 244 mg/dL — ABNORMAL HIGH (ref 70–99)
Glucose-Capillary: 174 mg/dL — ABNORMAL HIGH (ref 70–99)
Glucose-Capillary: 129 mg/dL — ABNORMAL HIGH (ref 70–99)
Glucose-Capillary: 83 mg/dL (ref 70–99)
Glucose-Capillary: 179 mg/dL — ABNORMAL HIGH (ref 70–99)
Glucose-Capillary: 134 mg/dL — ABNORMAL HIGH (ref 70–99)

## 2022-01-26 LAB — COOXEMETRY PANEL
Carboxyhemoglobin: 1.5 % (ref 0.5–1.5)
Methemoglobin: 1.5 % (ref 0.0–1.5)
O2 Saturation: 72.7 %
Total hemoglobin: 8.1 g/dL — ABNORMAL LOW (ref 12.0–16.0)

## 2022-01-26 LAB — MAGNESIUM: Magnesium: 2.5 mg/dL — ABNORMAL HIGH (ref 1.7–2.4)

## 2022-01-26 LAB — HEPARIN LEVEL (UNFRACTIONATED): Heparin Unfractionated: 0.21 IU/mL — ABNORMAL LOW (ref 0.30–0.70)

## 2022-01-26 MED ORDER — VITAL 1.5 CAL PO LIQD
900.0000 mL | ORAL | Status: DC
  Administered 2022-01-26: 1000 mL
  Administered 2022-01-27 – 2022-01-29 (×3): 900 mL
  Administered 2022-01-30: 75 mL
  Filled 2022-01-26 (×6): qty 948

## 2022-01-26 MED ORDER — MIDODRINE HCL 5 MG PO TABS
15.0000 mg | ORAL_TABLET | Freq: Three times a day (TID) | ORAL | Status: DC
  Administered 2022-01-26 – 2022-01-27 (×5): 15 mg via ORAL
  Filled 2022-01-26 (×5): qty 3

## 2022-01-26 MED ORDER — HEPARIN (PORCINE) 25000 UT/250ML-% IV SOLN
1150.0000 [IU]/h | INTRAVENOUS | Status: DC
  Administered 2022-01-26: 1000 [IU]/h via INTRAVENOUS
  Administered 2022-01-27 – 2022-01-29 (×3): 1100 [IU]/h via INTRAVENOUS
  Administered 2022-01-30 – 2022-01-31 (×3): 1200 [IU]/h via INTRAVENOUS
  Administered 2022-02-01: 1150 [IU]/h via INTRAVENOUS
  Filled 2022-01-26 (×8): qty 250

## 2022-01-26 MED ORDER — ROSUVASTATIN CALCIUM 5 MG PO TABS
10.0000 mg | ORAL_TABLET | Freq: Every day | ORAL | Status: DC
  Administered 2022-01-26 – 2022-02-02 (×8): 10 mg
  Filled 2022-01-26 (×8): qty 2

## 2022-01-26 MED ORDER — EZETIMIBE 10 MG PO TABS
10.0000 mg | ORAL_TABLET | Freq: Every day | ORAL | Status: DC
  Administered 2022-01-26 – 2022-02-04 (×10): 10 mg via ORAL
  Filled 2022-01-26 (×10): qty 1

## 2022-01-26 NOTE — Progress Notes (Incomplete)
01/26/2022 Resident?*** I saw and evaluated the patient. Discussed with resident and agree with resident's findings and plan as documented in the resident's note.  I have seen and evaluated the patient for shock secondary to right heart failure.  S:  ***  O: Blood pressure 94/76, pulse 92, temperature 98.8 F (37.1 C), resp. rate 19, height 4' 11.5" (1.511 m), weight 70.8 kg, SpO2 99 %.  Constitutional: ***  Eyes: *** Ears, nose, mouth, and throat: *** Cardiovascular: *** Respiratory: *** Gastrointestinal: *** Skin: No rashes, normal turgor Neurologic: *** Psychiatric: ***  Result review:  A:  Acute decompensated R>L heart failure with cardiogenic shock Atrial fibrillation on AC Acute hypoxemic resp failure in setting of advanced multifactorial pulmonary HTN ICU delirium ESRD on HD- thrombosed AVF Probable renal vasoplegia complicating shock picture Dysphagia Anemia of chronic renal failure DM2 with hyperglycemia currently on levemir + SSI  P:  - Levophed titrated to MAP 65 - Inotropes per CHF team - Levemir and SSI as ordered targeting 100-180 CBG  Patient critically ill due to *** Interventions to address this today *** Risk of deterioration without these interventions is high  I personally spent *** minutes providing critical care not including any separately billable procedures  Erskine Emery MD [facility] Prefer epic messenger for cross cover needs If after hours, please call E-link

## 2022-01-26 NOTE — Progress Notes (Signed)
ANTICOAGULATION CONSULT NOTE ? ?Pharmacy Consult for bivalirudin ?Indication: history of pulmonary embolus 2021 ? ?Allergies  ?Allergen Reactions  ? Minoxidil Other (See Comments)  ?  Pericardial effusion  ? Lipitor [Atorvastatin] Other (See Comments)  ?  MYALGIAS > "pain in legs" ?Tolerates rosuvastatin  ? Morphine And Related Itching  ? Ace Inhibitors Other (See Comments)  ?  REACTION: "not sure...think it made me drowsy all the time"  ? ? ?Patient Measurements: ?Height: 4' 11.5" (151.1 cm) ?Weight: 70.8 kg (156 lb 1.4 oz) ?IBW/kg (Calculated) : 44.35 ?Heparin DW: 70.8kg  ? ?Vital Signs: ?Temp: 97.9 ?F (36.6 ?C) (03/02 1500) ?BP: 108/67 (03/02 1500) ?Pulse Rate: 90 (03/02 1500) ? ?Labs: ?Recent Labs  ?  01/24/22 ?0354 01/24/22 ?1545 02/20/2022 ?0415 02/19/2022 ?3267 01/26/2022 ?0820 02/21/2022 ?1245 02/11/2022 ?1420 01/26/2022 ?1637 01/26/22 ?0322  ?HGB 7.4*  --  7.6*   < > 8.5* 8.5*  --   --  7.8*  ?HCT 23.8*  --  24.0*   < > 25.0* 25.0*  --   --  25.8*  ?PLT 163  --  165  --   --   --   --   --  156  ?APTT 46*  --  49*  --   --   --  54*  --  53*  ?CREATININE 1.32*   < > 1.23*  --   --   --   --  1.33* 1.24*  ? < > = values in this interval not displayed.  ? ? ? ?Estimated Creatinine Clearance: 38.7 mL/min (A) (by C-G formula based on SCr of 1.24 mg/dL (H)). ? ? ?Assessment: ?66 yo female on chronic apixaban for hx of PE 2021. Apixaban has been held for surgery.  Pharmacy asked to resume anticoagulation 2/15 with bivalirudin.  HIT antibody and SRA both negative.  Platelet count now recovered 50>208>150-160s.  ? ?Patient is therapeutic on bivalirudin. No s/x of bleeding per RN.  ? ?Pharmacy now requested to switch anticoagulation to heparin as HIT work-up negative. Will start at lower end with no bolus.  ? ?Goal of Therapy:  ?Heparin level 0.3-0.5 - target lower end given low platelet count/risk of post-operative bleeding ?Monitor platelets by anticoagulation protocol: Yes ?  ?Plan:  ?-Stop bival to 0.016 mg/kg/hr ?-Start  heparin 1000 units/hr ?-Heparin level in 6 hours ?-HL and CBC daily while on heparin ?-Monitor for s/x of bleeding ? ?Cathrine Muster, PharmD ?PGY2 Cardiology Pharmacy Resident ?Phone: [phone] ?01/26/2022  3:50 PM ? ?Please check [email or web address] for unit-specific pharmacy phone numbers. ? ? ? ? ? ? ?

## 2022-01-26 NOTE — Progress Notes (Signed)
Physical Therapy Treatment ?Patient Details ?Name: Jody Nguyen ?MRN: [id number] ?DOB: 06-17-1956 ?Today's Date: 01/26/2022 ? ? ?History of Present Illness 66 yo female presenting 2/5 with recurrent chest pain. Pt now s/p CABG x3 and tricuspid valve annuloplasty on 2/10 requiring 6 units PRBCs, 2 units FFP, and 2 bags of platelets and resulting in vasoplegic shock with very high vasopressor requirements. Started on CRRT 2/11, extubated 2/16. S/p right heart cath 3/1 and Swan-ganz placed. PMH includes: anemia, CHF, CKD IV on HD MWF, CVA, DM II, HLD, HTN, pulmonary HTN. ? ?  ?PT Comments  ? ? Pt now s/p right heart cath with a Swan-Ganz line placed at femoral site. Her femoral lines continue to limit her from being able to sit EOB at this time. However, pt making slow, but steady progress with activity tolerance and strength as she was able to progress to standing for up ~7 min at a 40 degree tilt in her bed today. Pt performed upper and lower extremity exercises while tilted to encourage muscular activation, standing balance, and strengthening. Will continue to follow acutely. Current recommendations remain appropriate. ?   ?Recommendations for follow up therapy are one component of a multi-disciplinary discharge planning process, led by the attending physician.  Recommendations may be updated based on patient status, additional functional criteria and insurance authorization. ? ?Follow Up Recommendations ? Acute inpatient rehab (3hours/day) ?  ?  ?Assistance Recommended at Discharge Frequent or constant Supervision/Assistance  ?Patient can return home with the following Two people to help with walking and/or transfers;Two people to help with bathing/dressing/bathroom;Assistance with cooking/housework;Help with stairs or ramp for entrance;Direct supervision/assist for medications management;Direct supervision/assist for financial management;Assist for transportation ?  ?Equipment Recommendations ? Other (comment)  (TBA)  ?  ?Recommendations for Other Services   ? ? ?  ?Precautions / Restrictions Precautions ?Precautions: Fall;Sternal;Other (comment) ?Precaution Booklet Issued: No ?Precaution Comments: bilateral femoral catheters, flexiseal, cortrack, rectal tube, watch BP, CRRT, Swan-ganz femoral, s/p R heart cath 3/1 ?Restrictions ?Weight Bearing Restrictions: Yes ?Other Position/Activity Restrictions: sternal precautions  ?  ? ?Mobility ? Bed Mobility ?  ?  ?  ?  ?  ?  ?  ?General bed mobility comments: Focused session on tilting secondary to femoral lines limiting hip ROM and contraindicating sitting EOB ?Start Time: [phone] ?Angle: 40 degrees ?Total Minutes in Angle: 7 minutes ?Patient Response: Flat affect, Cooperative (fatigued) ? ?Transfers ?  ?  ?  ?  ?  ?  ?  ?  ?  ?General transfer comment: Focused session on tilting secondary to femoral lines limiting hip ROM ?  ? ?Ambulation/Gait ?  ?  ?  ?  ?  ?  ?  ?General Gait Details: Focused session on tilting secondary to femoral lines limiting hip ROM and contraindicating sitting EOB ? ? ?Stairs ?  ?  ?  ?  ?  ? ? ?Wheelchair Mobility ?  ? ?Modified Rankin (Stroke Patients Only) ?  ? ? ?  ?Balance Overall balance assessment: Needs assistance ?  ?  ?  ?  ?  ?  ?  ?  ?  ?  ?  ?  ?  ?  ?  ?  ?  ?  ?  ? ?  ?Cognition Arousal/Alertness: Awake/alert ?Behavior During Therapy: Flat affect ?Overall Cognitive Status: Impaired/Different from baseline ?Area of Impairment: Attention, Memory, Following commands, Awareness, Safety/judgement, Problem solving ?  ?  ?  ?  ?  ?  ?  ?  ?  ?  Current Attention Level: Sustained ?Memory: Decreased short-term memory ?Following Commands: Follows one step commands with increased time, Follows one step commands consistently ?Safety/Judgement: Decreased awareness of safety, Decreased awareness of deficits ?Awareness: Intellectual ?Problem Solving: Slow processing, Difficulty sequencing, Requires verbal cues, Decreased initiation ?General Comments: Pt with  flat affect and slow processing, needing increased time and benefitting from tactile cues to follow simple commands. Follows simple commands fairly consistently. ?  ?  ? ?  ?Exercises General Exercises - Upper Extremity ?Shoulder Flexion: Both, 5 reps, AAROM, Other (comment) (tilted at 40') ?Elbow Flexion: Both, AAROM, 5 reps, Other (comment) (tilted at 40') ?Digit Composite Flexion: 10 reps, AROM, Both, Supine ?Composite Extension: 10 reps, AROM, Both, Supine ?General Exercises - Lower Extremity ?Ankle Circles/Pumps: AROM, Both, 10 reps, Supine ?Quad Sets: Both, 5 reps, Other (comment), AROM, Strengthening (tilted at 40') ?Heel Raises: AAROM, Both, Other reps (comment), Other (comment) (tilted at 40', min clearance with x4 attempts) ? ?  ?General Comments General comments (skin integrity, edema, etc.): VSS on RA throughout ?  ?  ? ?Pertinent Vitals/Pain Pain Assessment ?Pain Assessment: Faces ?Faces Pain Scale: Hurts even more ?Pain Location: bil UEs ?Pain Descriptors / Indicators: Discomfort, Grimacing, Guarding ?Pain Intervention(s): Monitored during session, Limited activity within patient's tolerance, Repositioned  ? ? ?Home Living   ?  ?  ?  ?  ?  ?  ?  ?  ?  ?   ?  ?Prior Function    ?  ?  ?   ? ?PT Goals (current goals can now be found in the care plan section) Acute Rehab PT Goals ?Patient Stated Goal: to get better ?PT Goal Formulation: With patient ?Time For Goal Achievement: 02/09/22 ?Potential to Achieve Goals: Good ?Progress towards PT goals: Progressing toward goals ? ?  ?Frequency ? ? ? Min 2X/week ? ? ? ?  ?PT Plan Current plan remains appropriate  ? ? ?Co-evaluation   ?  ?  ?  ?  ? ?  ?AM-PAC PT "6 Clicks" Mobility   ?Outcome Measure ? Help needed turning from your back to your side while in a flat bed without using bedrails?: Total ?Help needed moving from lying on your back to sitting on the side of a flat bed without using bedrails?: Total ?Help needed moving to and from a bed to a chair  (including a wheelchair)?: Total ?Help needed standing up from a chair using your arms (e.g., wheelchair or bedside chair)?: Total ?Help needed to walk in hospital room?: Total ?Help needed climbing 3-5 steps with a railing? : Total ?6 Click Score: 6 ? ?  ?End of Session   ?Activity Tolerance: Patient limited by fatigue ?Patient left: in bed;with call bell/phone within reach;with nursing/sitter in room ?Nurse Communication: Mobility status ?PT Visit Diagnosis: Unsteadiness on feet (R26.81);Other abnormalities of gait and mobility (R26.89);Muscle weakness (generalized) (M62.81);Difficulty in walking, not elsewhere classified (R26.2) ?  ? ? ?Time: [phone] ?PT Time Calculation (min) (ACUTE ONLY): 36 min ? ?Charges:  $Therapeutic Exercise: 8-22 mins ?$Neuromuscular Re-education: 8-22 mins          ?          ? ?Moishe Spice, PT, DPT ?Acute Rehabilitation Services  ?Pager: [phone] ?Office: [phone] ? ? ? ?Maretta Bees Pettis ?01/26/2022, 9:52 AM ? ?

## 2022-01-26 NOTE — Progress Notes (Signed)
EVENING ROUNDS NOTE : ? ?   ?[address] ?      [place] [address] ?            [phone]   ?              ?1 Day Post-Op ?Procedure(s) (LRB): ?RIGHT HEART CATH (N/A) ? ? ?Total Length of Stay:  LOS: 25 days  ?Events:   ?No events ? ? ? ? ?BP 100/79   Pulse 87   Temp (!) 97.5 ?F (36.4 ?C)   Resp 18   Ht 4' 11.5" (1.511 m)   Wt 70.8 kg   SpO2 100%   BMI 31.00 kg/m?  ? ?PAP: (39-56)/(19-35) 45/19 ?CVP:  [8 mmHg-18 mmHg] 8 mmHg ?PCWP:  [15 mmHg] 15 mmHg ?CO:  [4.5 L/min-5.9 L/min] 4.9 L/min ?CI:  [2.7 L/min/m2-3.6 L/min/m2] 3 L/min/m2 ? ?  ? ?  prismasol BGK 4/2.5 500 mL/hr at 01/26/22 0651  ?  prismasol BGK 4/2.5 500 mL/hr at 01/26/22 8657  ? sodium chloride    ? sodium chloride 10 mL/hr at 01/26/22 1600  ? sodium chloride Stopped (01/26/22 0735)  ? sodium chloride 10 mL/hr at 01/26/22 1600  ? amiodarone 30 mg/hr (01/26/22 1600)  ? epinephrine 8 mcg/min (01/26/22 1600)  ? heparin 1,000 Units/hr (01/26/22 1613)  ? norepinephrine (LEVOPHED) Adult infusion Stopped (01/26/22 1537)  ? prismasol BGK 4/2.5 1,400 mL/hr at 01/26/22 1252  ? vasopressin 0.04 Units/min (01/26/22 1600)  ? ? ?I/O last 3 completed shifts: ?In: 3278.1 [P.O.:120; I.V.:2181.7; NG/GT:976.3] ?Out: 8469 [GEXBM:8413; Stool:725] ? ? ?CBC Latest Ref Rng & Units 01/26/2022 02/21/2022 02/24/2022  ?WBC 4.0 - 10.5 K/uL 12.6(H) - -  ?Hemoglobin 12.0 - 15.0 g/dL 7.8(L) 8.5(L) 8.5(L)  ?Hematocrit 36.0 - 46.0 % 25.8(L) 25.0(L) 25.0(L)  ?Platelets 150 - 400 K/uL 156 - -  ? ? ?BMP Latest Ref Rng & Units 01/26/2022 02/17/2022 02/20/2022  ?Glucose 70 - 99 mg/dL 266(H) 245(H) -  ?BUN 8 - 23 mg/dL 13 15 -  ?Creatinine 0.44 - 1.00 mg/dL 1.24(H) 1.33(H) -  ?BUN/Creat Ratio 6 - 22 (calc) - - -  ?Sodium 135 - 145 mmol/L 134(L) 136 135  ?Potassium 3.5 - 5.1 mmol/L 4.6 4.6 4.4  ?Chloride 98 - 111 mmol/L 100 101 -  ?CO2 22 - 32 mmol/L 24 24 -  ?Calcium 8.9 - 10.3 mg/dL 9.4 9.4 -  ? ? ?ABG ?   ?Component Value Date/Time  ? PHART 7.425 01/20/2022 1944  ? PCO2ART  35.9 01/20/2022 1944  ? PO2ART 80 (L) 01/20/2022 1944  ? HCO3 26.2 02/11/2022 0821  ? TCO2 27 02/03/2022 0821  ? ACIDBASEDEF 1.0 01/20/2022 1944  ? O2SAT 72.7 01/26/2022 0453  ? ? ? ? ? ?Melodie Bouillon, MD ?01/26/2022 4:19 PM ? ? ?

## 2022-01-26 NOTE — Progress Notes (Signed)
Occupational Therapy Treatment ?Patient Details ?Name: Jody Nguyen ?MRN: [id number] ?DOB: 01/25/1956 ?Today's Date: 01/26/2022 ? ? ?History of present illness 66 yo female presenting 2/5 with recurrent chest pain. Pt now s/p CABG x3 and tricuspid valve annuloplasty on 2/10 requiring 6 units PRBCs, 2 units FFP, and 2 bags of platelets and resulting in vasoplegic shock with very high vasopressor requirements. Started on CRRT 2/11, extubated 2/16. S/p right heart cath 3/1 and Swan-ganz placed. PMH includes: anemia, CHF, CKD IV on HD MWF, CVA, DM II, HLD, HTN, pulmonary HTN. ?  ?OT comments ? Focus of session on BUE and core strengthening, bed level ADL tasks and family education. Began education regarding BUE A/AAROM while following ROM guidelines for sternal precautions. Pt fatigues very easily. Pt will benefit from  extension post acute rehab. VSS during session however nsg state pt is "more anxious" today. Acute OT to continue to follow.   ? ?Recommendations for follow up therapy are one component of a multi-disciplinary discharge planning process, led by the attending physician.  Recommendations may be updated based on patient status, additional functional criteria and insurance authorization. ?   ?Follow Up Recommendations ? Acute inpatient rehab (3hours/day)  ?  ?Assistance Recommended at Discharge Frequent or constant Supervision/Assistance  ?Patient can return home with the following ?   ?  ?Equipment Recommendations ? Other (comment) (will assess)  ?  ?Recommendations for Other Services PT consult ? ?  ?Precautions / Restrictions Precautions ?Precautions: Fall;Sternal;Other (comment) ?Precaution Booklet Issued: No ?Precaution Comments: bilateral femoral catheters, flexiseal, cortrack, rectal tube, watch BP, CRRT, Swan-ganz femoral, s/p R heart cath 3/1 ?Restrictions ?Weight Bearing Restrictions: Yes ?Other Position/Activity Restrictions: sternal precautions  ? ? ?  ? ?Mobility Bed Mobility ?  ? HOB @ 30  degrees due to B femoral lines ?  ?  ?  ?  ?  ?  ?  ? ?Transfers ?  ? Not attempted ?  ?  ?  ?  ?  ?  ?  ?  ?  ?  ?Balance   ?  ?  ?  ?  ?  ?  ?  ?  ?  ?  ?  ?  ?  ?  ?  ?  ?  ?  ?   ? ?ADL either performed or assessed with clinical judgement  ? ?ADL   ?  ?  ?Grooming: Wash/dry face;Min guard;Set up;Bed level ?  ?  ?  ?  ?  ?  ?  ?  ?  ?  ?  ?  ?  ?  ?  ?  ?General ADL Comments: using L hand to wash face then apply lotion; educated husband on using these activities for exercise in addition to facilitating more independence with self care ?  ? ?Extremity/Trunk Assessment Upper Extremity Assessment ?Upper Extremity Assessment: RUE deficits/detail;LUE deficits/detail ?RUE Deficits / Details: edematous RUE; R hand in dependent position; arm elevated with hand higher than elbow; educated husband on ROM; able to lift hand off bed and slide on tummy; uanble to actively reach mouth, but able to reach mouth with AAROM ?RUE Coordination: decreased fine motor;decreased gross motor ?LUE Deficits / Details: stronger overall than RUE; able to coplete hand to mouth without difficulty; assistance to reach hand to top of head ?LUE Coordination: decreased fine motor;decreased gross motor ?  ?Lower Extremity Assessment ?Lower Extremity Assessment: Defer to PT evaluation ?  ?  ?  ? ?Vision   ?  ?  ?Perception   ?  ?  Praxis   ?  ? ?Cognition Arousal/Alertness: Awake/alert ?Behavior During Therapy: Anxious ?Overall Cognitive Status: Impaired/Different from baseline ?Area of Impairment: Attention, Awareness, Memory, Problem solving ?  ?  ?  ?  ?  ?  ?  ?  ?  ?Current Attention Level: Sustained ?Memory: Decreased recall of precautions ?Following Commands: Follows one step commands consistently ?Safety/Judgement: Decreased awareness of deficits ?Awareness: Emergent ?Problem Solving: Slow processing ?General Comments: Nsg states a little"anxious"; meds given ?  ?  ?   ?Exercises General Exercises - Upper Extremity ?Shoulder Flexion: AAROM,  Both, 10 reps ?Elbow Flexion: AAROM, AROM, Both, 10 reps ?Elbow Extension: AROM, AAROM, Both, 10 reps ?Wrist Flexion: AAROM, AROM, Both, 10 reps ?Wrist Extension: AROM, AAROM, Both, 10 reps ?Digit Composite Flexion: AROM, Both, 10 reps, Squeeze ball ?General Exercises - Lower Extremity ?Ankle Circles/Pumps: AROM, Both, 15 reps ?Other Exercises ?Other Exercises: midline crossing with UE in reclined position with head lift off to facilitate obliques; B sides x 10 ?Other Exercises: shoulder rolls forward/backward x 10 ? ?  ?Shoulder Instructions   ? ? ?  ?General Comments VSS on RA throughout  ? ? ?Pertinent Vitals/ Pain       Pain Assessment ?Pain Assessment: Faces ?Faces Pain Scale: Hurts little more ?Pain Location: general discomfort ?Pain Descriptors / Indicators: Discomfort, Guarding ?Pain Intervention(s): Limited activity within patient's tolerance ? ?Home Living   ?  ?  ?  ?  ?  ?  ?  ?  ?  ?  ?  ?  ?  ?  ?  ?  ?  ?  ? ?  ?Prior Functioning/Environment    ?  ?  ?  ?   ? ?Frequency ? Min 2X/week  ? ? ? ? ?  ?Progress Toward Goals ? ?OT Goals(current goals can now be found in the care plan section) ? Progress towards OT goals: Progressing toward goals ? ?Acute Rehab OT Goals ?Patient Stated Goal: to get stronger ?OT Goal Formulation: With patient/family ?Time For Goal Achievement: 02/02/22 ?Potential to Achieve Goals: Good ?ADL Goals ?Pt Will Perform Grooming: with min assist;sitting ?Pt/caregiver will Perform Home Exercise Program: Both right and left upper extremity;With written HEP provided;With minimal assist ?Additional ADL Goal #1: Pt will tolerate 20 minutes of semi standing position with kreg bed tilted at 45* in preparation for ADLs ?Additional ADL Goal #2: Pt will demonstrate selective attention during ADLs with Min cues  ?Plan Discharge plan remains appropriate   ? ?Co-evaluation ? ? ?   ?  ?  ?  ?  ? ?  ?AM-PAC OT "6 Clicks" Daily Activity     ?Outcome Measure ? ? Help from another person eating  meals?: A Lot ?Help from another person taking care of personal grooming?: A Lot ?Help from another person toileting, which includes using toliet, bedpan, or urinal?: Total ?Help from another person bathing (including washing, rinsing, drying)?: Total ?Help from another person to put on and taking off regular upper body clothing?: Total ?Help from another person to put on and taking off regular lower body clothing?: Total ?6 Click Score: 8 ? ?  ?End of Session   ? ?OT Visit Diagnosis: Other abnormalities of gait and mobility (R26.89);Unsteadiness on feet (R26.81);Muscle weakness (generalized) (M62.81) ?  ?Activity Tolerance Patient tolerated treatment well ?  ?Patient Left in bed;with call bell/phone within reach;with nursing/sitter in room;with family/visitor present ?  ?Nurse Communication Mobility status;Other (comment) (positioning) ?  ? ?   ? ?Time: 1202-1228 ?OT  Time Calculation (min): 26 min ? ?Charges: OT General Charges ?$OT Visit: 1 Visit ?OT Treatments ?$Self Care/Home Management : 8-22 mins ?$Therapeutic Exercise: 8-22 mins ? ?[facility], OT/L  ? ?Acute OT Clinical Specialist ?Acute Rehabilitation Services ?Pager [phone] ?Office [phone]  ? ?Janika Jedlicka,HILLARY ?01/26/2022, 12:50 PM ?

## 2022-01-26 NOTE — Progress Notes (Addendum)
Advanced Heart Failure Rounding Note  PCP-Cardiologist: [name], MD   Subjective:    3/1: RHC with swan placement (on NE 2, EPi 3, Milrinone 0.25 and VP 0.02) w/ surprisingly well-compensated hemodynamics (see data below). Milrinone discontinued given low SVR  Today, remains on Epi 8, NE 3, VP 0.04. Off milrinone.    Swan #s  CVP 11 PAP 54/26 (36) CO 5.20 CI 3.13  Co-ox 73%  PAPi 2.5  SVR 1031 CPO 0.89    2.4L volume removal w/ CVVHD yesterday. Currently running even.   Sitting up in bed. Awake A&Ox3. Breathing ok. Wants to get out of bed, but no other complaints. Continues w/ TFs   RHC 3/1 (on 4 vasopressors/inotropes)   Ao = 104/48 (63) RA = 11 RV = 35/10 PA = 53/26 (34) PCW = 17  Fick cardiac output/index = 6.4/3.8 Thermal CO/CI = 5.2/3.1 PVR (Fick) = 2.7 PVR (Therm) = 3.3 SVR (Fick) = 650 SVR (Therm) = 807 Ao sat = 92% PA sat = 58%, 57%  Objective:   Weight Range: 70.8 kg Body mass index is 31 kg/m.   Vital Signs:   Temp:  [97.2 F (36.2 C)-99.7 F (37.6 C)] 99 F (37.2 C) (03/02 0700) Pulse Rate:  [0-102] 94 (03/02 0700) Resp:  [10-36] 21 (03/02 0700) BP: (79-115)/(52-103) 103/63 (03/02 0630) SpO2:  [88 %-100 %] 99 % (03/02 0700) Arterial Line BP: (94-128)/(38-59) 94/38 (03/02 0700) Weight:  [70.8 kg] 70.8 kg (03/02 0408) Last BM Date : 02/03/2022  Weight change: Filed Weights   01/24/22 0500 02/14/2022 0230 01/26/22 0408  Weight: 72 kg 71.2 kg 70.8 kg    Intake/Output:   Intake/Output Summary (Last 24 hours) at 01/26/2022 0757 Last data filed at 01/26/2022 0700 Gross per 24 hour  Intake 2410.73 ml  Output 2700 ml  Net -289.27 ml      Physical Exam    CVP 11 General:  fatigued appearing female, sitting up in bed. No respiratory difficulty HEENT: normal + cortrak  Neck: supple. Short/thick neck JVD not well visualized. Carotids 2+ bilat; no bruits. No lymphadenopathy or thyromegaly appreciated. Cor: PMI nondisplaced.  Regular rate & rhythm. No rubs, gallops or murmurs. Sternotomy site ok  Lungs: clear Abdomen: soft, nontender, nondistended. No hepatosplenomegaly. No bruits or masses. Good bowel sounds. Extremities: no cyanosis, clubbing, rash, trace b/l LE edema, legs warm + rt fem artery HD cath, LFV TLC  Neuro: alert & oriented x 3, cranial nerves grossly intact. moves all 4 extremities w/o difficulty. Affect pleasant.   Telemetry   NSR 90s  personally checked.   EKG    N/A   Labs    CBC Recent Labs    02/17/2022 0415 02/06/2022 0817 01/26/2022 0821 01/26/22 0322  WBC 13.2*  --   --  12.6*  NEUTROABS 10.5*  --   --  9.9*  HGB 7.6*   < > 8.5* 7.8*  HCT 24.0*   < > 25.0* 25.8*  MCV 97.2  --   --  100.0  PLT 165  --   --  156   < > = values in this interval not displayed.   Basic Metabolic Panel Recent Labs    02/08/2022 0415 02/06/2022 0817 02/24/2022 1637 01/26/22 0322  NA 136   < > 136 134*  K 4.4   < > 4.6 4.6  CL 102  --  101 100  CO2 24  --  24 24  GLUCOSE 179*  --  245*  266*  BUN 13  --  15 13  CREATININE 1.23*  --  1.33* 1.24*  CALCIUM 9.7  --  9.4 9.4  MG 2.4  --   --  2.5*  PHOS 2.6  --  3.0 3.0   < > = values in this interval not displayed.   Liver Function Tests Recent Labs    02/21/2022 1637 01/26/22 0322  ALBUMIN 2.6* 2.6*   No results for input(s): LIPASE, AMYLASE in the last 72 hours. Cardiac Enzymes No results for input(s): CKTOTAL, CKMB, CKMBINDEX, TROPONINI in the last 72 hours.  BNP: BNP (last 3 results) Recent Labs    12/20/21 2246 01/01/22 0941  BNP 1,753.1* 1,432.4*    ProBNP (last 3 results) No results for input(s): PROBNP in the last 8760 hours.   D-Dimer No results for input(s): DDIMER in the last 72 hours. Hemoglobin A1C No results for input(s): HGBA1C in the last 72 hours. Fasting Lipid Panel No results for input(s): CHOL, HDL, LDLCALC, TRIG, CHOLHDL, LDLDIRECT in the last 72 hours. Thyroid Function Tests No results for input(s): TSH,  T4TOTAL, T3FREE, THYROIDAB in the last 72 hours.  Invalid input(s): FREET3  Other results:   Imaging    CARDIAC CATHETERIZATION  Result Date: 02/03/2022 Findings: On NE 2 EPi 3 Milrinone 0.25 VP 0.02 Ao = 104/48 (63) RA = 11 RV = 35/10 PA = 53/26 (34) PCW = 17 Fick cardiac output/index = 6.4/3.8 Thermal CO/CI = 5.2/3.1 PVR (Fick) = 2.7 PVR (Therm) = 3.3 SVR (Fick) = 650 SVR (Therm) = 807 Ao sat = 92% PA sat = 58%, 57% Assessment: Surprisingly well-compensated hemodynamics on 4 vasopressors/inotropes. Volume status much improved Plan/Discussion: Will wean pressors using swan guidance. Cut milrinone to 0.125. Wean NE next.  Keep VP for now with low SVR. Glori Bickers, MD 8:45 AM  Port CXR  Result Date: 02/03/2022 CLINICAL DATA:  Central line placement EXAM: PORTABLE CHEST 1 VIEW COMPARISON:  01/21/2022 FINDINGS: Nasogastric tube projecting over the stomach. Femoral insertion of a Swan-Ganz catheter with the tip projecting over the left main pulmonary artery. Bilateral mild interstitial thickening. Left basilar airspace disease likely reflecting atelectasis. No pleural effusion or pneumothorax. Stable cardiomegaly. Prior CABG. Prior tricuspid and mitral valve replacement. No acute osseous abnormality. IMPRESSION: 1. Femoral insertion of a Swan-Ganz catheter with the tip projecting over the left main pulmonary artery. 2. Nasogastric tube projecting over the stomach. 3. Cardiomegaly with mild pulmonary vascular congestion. Electronically Signed   By: Kathreen Devoid M.D.   On: 01/30/2022 10:04     Medications:     Scheduled Medications:  aspirin EC  325 mg Oral Daily   Or   aspirin  324 mg Per Tube Daily   bisacodyl  10 mg Oral Daily   Or   bisacodyl  10 mg Rectal Daily   chlorhexidine gluconate (MEDLINE KIT)  15 mL Mouth Rinse BID   Chlorhexidine Gluconate Cloth  6 each Topical Daily   darbepoetin (ARANESP) injection - NON-DIALYSIS  100 mcg Subcutaneous Q Sat-1800   docusate  200 mg Per  Tube Daily   doxercalciferol  9 mcg Intravenous Q M,W,F-HD   feeding supplement (PROSource TF)  45 mL Per Tube BID   insulin aspart  0-20 Units Subcutaneous Q4H   insulin aspart  8 Units Subcutaneous Q4H   insulin detemir  20 Units Subcutaneous BID   latanoprost  1 drop Both Eyes QHS   midodrine  15 mg Oral TID WC   multivitamin  1  tablet Per Tube QHS   pantoprazole sodium  40 mg Per Tube Daily   rosuvastatin  10 mg Per Tube QHS   sodium chloride flush  10-40 mL Intracatheter Q12H   sodium chloride flush  3 mL Intravenous Q12H   Thrombi-Pad  1 each Topical Once    Infusions:   prismasol BGK 4/2.5 500 mL/hr at 01/26/22 0651    prismasol BGK 4/2.5 500 mL/hr at 01/26/22 7353   sodium chloride     sodium chloride 10 mL/hr at 01/26/22 0700   sodium chloride 10 mL/hr at 01/26/22 0700   sodium chloride 10 mL/hr at 01/26/22 0700   amiodarone 30 mg/hr (01/26/22 0726)   bivalirudin (ANGIOMAX) infusion 0.5 mg/mL (Non-ACS indications) 0.016 mg/kg/hr (01/26/22 0700)   epinephrine 8 mcg/min (01/26/22 0700)   feeding supplement (VITAL 1.5 CAL) 50 mL/hr at 01/26/22 0259   milrinone Stopped (01/31/2022 1414)   norepinephrine (LEVOPHED) Adult infusion 2 mcg/min (01/26/22 0700)   prismasol BGK 4/2.5 1,400 mL/hr at 01/26/22 0447   vasopressin 0.04 Units/min (01/26/22 0700)    PRN Medications: sodium chloride, sodium chloride, alteplase, food thickener, Gerhardt's butt cream, heparin, hydrOXYzine, ondansetron (ZOFRAN) IV, oxyCODONE, sodium chloride, sodium chloride flush, sodium chloride flush, traMADol    Patient Profile   66 y/o female with obesity, T2DM, previous PE, ESRD and mitral regurgitation, 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 Subsequently followed at [facility] [place] for mixed [place] on macitentan and sildenafil. Admitted 2/5 for CP. LHC showed severe 3v CAD with CTO of RCA & LCX and critical ostial LAD lesion. Initial echo, EF 40-45%, no MR, mod MS, RV mildly reduced w/ severe TR.  Underwent CABG w/ PFO closure and tricuspid valve repair/valvuloplasty w/ ring 01/07/2022. Complicated post CABG course with RV failure, GN bacteremia and persistent hypotension and inability to wean off pressors. Nephrology following for CVVH.  Assessment/Plan   1. Post-cardiotomy cardiogenic shock - likely due predominantly to end-stage RV failure. (Required ECMO in 2016 post MVR) - Post op platelets down to 30. Switched to Bival. HIT negative.  - RHC 3/1 revealed surprisingly well-compensated hemodynamics on 4 vasopressors/inotropes. CO/CI 6.4/3.8, RA 11, PCW 17, PAPi 2.45  - Milrinone stopped 3/1 w/ low SVR (650)  - Today, on EPI 8, NE 3, VP 0.04. Co-ox 73%, CI 3.1, PAPi 2.5, CVP 11. SVR improved, 1031 - Continue CVVHD, aim to keep even  - Try weaning NE today. Continue midodrine    2. PAH with cor-pulmonale, end-stage - she clearly has mixed PH (predominantly WHO Groups II-III but perhaps contributions from I & IV) - Hold macitentan. Given her hemodynamics  doubt she will have much to gain from further selective pulmonary artery vasodilators but we can use PA cath to help figure that out - Continue epi. Off Milrinone w/ low SVR   3. Severe 3vCAD s/p CABG x3 with closure PFO + TV ring on 01/19/2022 - no current s/s angina - continue ASA/Statin   4. ESRD - currently on CVVHD   5. SVT/possible PAF -- continue IV amio while on inotropes/pressors - plts recovered. HIT negative. ? Switch from Bival back to heparin    6. H/O PE - 11/2020 VQ : unchanged left lower lobe apical segment PE - VQ 01/02/22 low prob - ? Switch from Bival back to heparin today, HIT negative.    7. H/O MV Ring 2016 - stable on echo   8. Anemia  - Hgb down, 8.5>>7.8 - transfuse for hgb < 7.0  -  check iron stores, may need feraheme    9. Bacteremia  - morganella morganii bacteremia - completed abx - BCx 2/18 cleared   Length of Stay: [address], PA-C  01/26/2022, 7:57 AM  Advanced Heart  Failure Team Pager [phone] (M-F; 7a - 5p)  Please contact [facility] for night-coverage after hours (5p -7a ) and weekends on [email or web address] =  Agree with above.   Remains weak but was able to stand with help per report. Denies SOB. Feels like she needs to have BM (Flexiseal in place)   On multiple pressors. On CVVHD keeping even.   Luiz Blare numbers done personally.   RA 13 PA 50/23 (34) PCWP 15 Thermo CO/CI 5.4/3.3 PVR 3.1 WU SVR 721  General:  Weak appearing. No resp difficulty HEENT: normal Neck: supple Carotids 2+ bilat; no bruits. No lymphadenopathy or thryomegaly appreciated. Cor: Regular rate & rhythm. No rubs, gallops or murmurs. Lungs: clear Abdomen: obese soft, nontender, nondistended. No hepatosplenomegaly. No bruits or masses. Good bowel sounds. Extremities: no cyanosis, clubbing, rash, tr edema  RFV HD cath [facility] Neuro: alert & orientedx3, cranial nerves grossly intact. moves all 4 extremities w/o difficulty. Affect pleasant  She remains tenuous. Continue to wean pressors based on swan numbers. Will increase midodrine to 15 tid.   D/w Dr. Carlis Abbott.   CRITICAL CARE Performed by: Glori Bickers  Total critical care time: 35 minutes  Critical care time was exclusive of separately billable procedures and treating other patients.  Critical care was necessary to treat or prevent imminent or life-threatening deterioration.  Critical care was time spent personally by me (independent of midlevel providers or residents) on the following activities: development of treatment plan with patient and/or surrogate as well as nursing, discussions with consultants, evaluation of patient's response to treatment, examination of patient, obtaining history from patient or surrogate, ordering and performing treatments and interventions, ordering and review of laboratory studies, ordering and review of radiographic studies, pulse oximetry and re-evaluation of patient's  condition.  Glori Bickers, MD  4:06 PM

## 2022-01-26 NOTE — Progress Notes (Signed)
NAME:  Jody Nguyen, MRN:  [id number], DOB:  04-28-1956, LOS: 36 ADMISSION DATE:  12/30/2021, CONSULTATION DATE:  01/22/2022 REFERRING MD:  Marlowe Sax - TRH, CHIEF COMPLAINT:  SOB   History of Present Illness:  66 year old woman who presented to [facility] ED 2/4 with CP radiating to her arm. PMHx significant for  ESRD (on HD), non-obstructive CAD, suspected pulmonary hypertension, PE (on Eliquis). Recent ED presentation 2/1 for similar symptoms (troponin 100s). F/u with Cardiology as an outpatient 2/2 and CP was felt to be related to volume shift (occurring during dialysis).   On ED presentation 2/4, patient reported episodic chest pain, non-exertional and intermittent x 1 week with associated nausea and SOB. Non-specific EKG changes. Cardiology consulted, STEMI ruled out. Troponin elevated at 200. Placed on [place] for SpO2 80s. Admitted to [facility] for observation with concern for PE and started on heparin gtt. VQ with low probability for PE. Echo 2/19 demonstrated severe RV/RA enlargement, severely reduced RV function and septal bowing; felt to be consistent with chronic process such as severe pulmonary HTN.    PCCM has been consulted in this setting.  Pertinent Medical History:  ESRD PE Pulmonary HTN  CAD CVA HLD HTN S/p MV repair  Pericardial effusion r/t minoxidil  Diastolic HF DM2  Significant Hospital Events: Including procedures, antibiotic start and stop dates in addition to other pertinent events   2/4 ED presentation with recurring chest pain -- STEMI ruled out. Stable, mildly hypoxic and placed on [place] 2/5 Admit to [facility] for obvs. Concern for PE, ECHO performed by cardiology reportedly shows RV failure, but felt more consistent with chronic process like severe pulmHTN. Awaiting VQ scan. Pulm consulted  2/7 Cardiac cath. 3 vessel dz. CABG rec 2/10 CABG, TVR 2/11 Continued vasoplegic shock with very high vasopressor requirements  2/12 Given methylene blue x 2 2/13 Chest tube removed 2/14 JP  removed 2/15 pacing wires removed 2/16 milrinone stopped, extubated 2/17 Zosyn for GNR bacteremia-> Morganella morganii 2/18 Arterial line, central line, vascath all removed and replaced. 2/19 Milrinone back on. Co-ox improved. ECHO Left ventricular ejection fraction, by estimation, is 50 to 55%. The left ventricle has low normal function. No regional WM abnormality. Right Ventricle: Ventricular septum is flattened in systole and diastole  suggesting RV pressure and volume overload. The right ventricular size is  severely enlarged. Right vetricular wall thickness was not well  visualized. Right ventricular systolic  function is severely reduced. There is moderately elevated pulmonary  artery systolic pressure 6/07 still high pressor needs. CRRT stopped. Having intermittent bouts of tachycardia 2/21 Hemodynamics better. Weaning Epi. Still on Norepi and inotropic support. Supported on BIPAP over night and tolerated well. On am rounds more lethargic w/ generalized pain everywhere. Co-ox > 70. Glucose poorly controlled. 2/22 Escalating pressor requirements (NE, Epi, Vaso). Continues on milrinone. Afib with RVR overnight, now with persistent AF (on amio, bival). Co-ox 73%. Hgb 7.3. WBC uptrending (25 from 20), Cr uptrending, CRRT restarted. Tolerating BiPAP. 2/23 Improving pressor needs. Stable Epi/Vaso, downtitrating NE. Milrinone 0.25. Persistent AF, rate controlled. Co-ox 65.7%. Ongoing CRRT. BiPAP QHS. 2/24 Continued decreasing pressor needs, stable epi/vaso, downtitrating Levo.  Milrinone 0.25.  Intermittent A-fib, in and out of NSR, rates 90s.  Co-ox 76.1%.  Ongoing CRRT, did not utilize BiPAP overnight. FEES with SLP. 2/25 confused.  Pressor requirements down some.  Got 1 unit of blood for hemoglobin of 7, Co. ox dropped. Vasopressin decreased to 0.03 units.   Interim History / Subjective:  This morning her  main complaint is wanting coffee. She hasn't had much of an appetite and has not been eating  much the past few days. TF have been continuing. She has pain around fistula which is also site of IV infiltration  Objective:  Blood pressure 103/63, pulse 94, temperature 99 F (37.2 C), resp. rate (!) 21, height 4' 11.5" (1.511 m), weight 70.8 kg, SpO2 99 %. PAP: (39-54)/(19-35) 39/23 CVP:  [10 mmHg-18 mmHg] 11 mmHg CO:  [4.5 L/min-5.9 L/min] 5.6 L/min CI:  [2.7 L/min/m2-3.6 L/min/m2] 3.4 L/min/m2      Intake/Output Summary (Last 24 hours) at 01/26/2022 0758 Last data filed at 01/26/2022 0700 Gross per 24 hour  Intake 2410.73 ml  Output 2700 ml  Net -289.27 ml    Filed Weights   01/24/22 0500 02/14/2022 0230 01/26/22 0408  Weight: 72 kg 71.2 kg 70.8 kg   Physical Examination: General: chronically ill appearing woman lying in bed in NAD HEENT: [place]/AT, eyes anicteric Pulmonary: Breathing comfortably on nasal cannula, CTAB. Cardiac: S1-S2, regular rate and rhythm Abdomen obese, soft, nontender, nondistended Extremities: Some dark discoloration and warmth around fistula on her right arm, tender to palpation.  No peripheral edema Neuro: Awake and alert, answering questions appropriately, moving all extremities but globally weak  Derm: Warm, dry.  No diffuse rashes.  Coox 73% Na+  134 BUN 13 Cr 1.24 Phos 3 WBC 12.6 H/H 7.8/25.8  RHC 01/28/2022: On NE 2 EPi 3 Milrinone 0.25 VP 0.02   Ao = 104/48 (63) RA = 11 RV = 35/10 PA = 53/26 (34) PCW = 17  Fick cardiac output/index = 6.4/3.8 Thermal CO/CI = 5.2/3.1 PVR (Fick) = 2.7 PVR (Therm) = 3.3 SVR (Fick) = 650 SVR (Therm) = 807 Ao sat = 92% PA sat = 58%, 57%  [facility] records reviewed>   RHC 04/2019: RA 10, PA 65/31 (43), PCWP 18 (v-20), BP 187/110, PVR 5, CI 2.83 RHC 09/2019 (done on a non HD day) PA 55/25 (36), PCWP 18, BP 152/93 (113), PA sat 81, PVR 3.96 & 3.52, CI 2.69 by Fick and 3.02 by Thermo  09/06/2021 RHC: RA 8, RV 52/3 (8), PA 50/23 (32) PCWP 21  Fick: CO 5.8, CI 3.2 Thermodilution: CO 3.97, CI 2.33  Assessment &  Plan:  Principal Problem:   Chest pain, Multivessel CAD Active Problems:   Hyperlipidemia associated with type 2 diabetes mellitus (HCC)   Anemia of chronic renal failure   Type 2 diabetes mellitus (HCC)   Obesity (BMI 30-39.9)   Pulmonary hypertension (HCC)   Chronic combined systolic and diastolic heart failure (HCC)   Atrial fibrillation (HCC)   End stage renal disease on dialysis (HCC)   History of stroke   QT prolongation   SVT (supraventricular tachycardia) (HCC)   Coronary artery disease involving native coronary artery of native heart with unstable angina pectoris (HCC)   Hyponatremia   GERD (gastroesophageal reflux disease)   Septic shock (HCC)   Gram-negative bacteremia   Hx of CABG   S/P TVR (tricuspid valve repair)   Cardiogenic shock (HCC)   Tachycardia   S/P CABG (coronary artery bypass graft)   Metabolic encephalopathy   Acute hypoxemic respiratory failure (HCC)   Pressure injury of skin  CAD with TR, s/p CABG x 3 and tricuspid repair 2/10 Acute-on-chronic HFrEF, acute right heart failure> has had some recovery of LV function, but RV function severely reduced on echo. On inotropes and pressors, she has mild PH and compensated cardiac function. Etiology of PH unknown; per  [facility] records-- has WHO group II and I (2/2 ESRD). 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 PFTs with chronic restriction potentially related to obesity. Luiz Blare needed for monitoring hemodynamics. -con't holding pulmonary vasodilators -Con't bivalirudin -Off milrinone. Con't vaso & epinephrine. Weaning norepi as able.  -Increase midodrine 15mg  TID -Not a candidate for guideline directed medical therapy or beta-blocker at this point due to ongoing hypotension and need for inotropic support -daily aspirin and statin -Appreciate Cardiology's management  Shock-concern that this is mostly  cardiogenic at this point.  Lines have been changed out and she has remained on broad-spectrum antibiotics, making septic shock less likely due to ongoing persistent source of her shock.  Vasoplegia should have been resolved by now. -holding pulmonary vasodilators; not needing currently. - Continue vaso, epi. Weaning norepi. Increase midodrine. -monitoring off antibiotics  Afib, persistent; RVR resolved.  Back in sinus rhythm today. Afib with RVR noted 2/21PM with rates into 150s. -Continue amiodarone. Con't IV amio until transitioning off positive inotropes and chronotropes. - Continue telemetry monitoring - Monitor electrolytes, management with dialysis -Bivalirudin for anticoagulation  Acute respiratory failure with hypoxia, suspected component of PAH.  Most recent chest x-ray on 2/25 not commencing of-supplemental oxygen to maintain SPO2 greater than 90%. - Supplemental oxygen to maintain SPO2 greater than 90% -Maintain euvolemia with dialysis - Pulmonary hygiene  ESRD on HD Thrombosed AVF -CRRT per nephrology -Eventually will need tunneled dialysis catheter-- should ideally be more stable for this  Acute metabolic encephalopathy- likely ICU delirium vs less likely sepsis.  -delirium precautions, avoid deliriogenic medications -Con't CRRT  DM2 w/ hyperglycemia, uncontrolled Con't Levemir 20 units bid; may need to increase if still needing TF with liberalized diet -Resistant scale sliding scale insulin -Tube feed coverage added overnight. -goal BG 140-180 -If transitioning to more oral intake and less tube feeds, can transition to mealtime coverage.  Anemia of critical illness and renal failure, losing blood frequently in CRRT filters -Transfuse for hemoglobin less than 7 or hemodynamically significant bleeding -epo per nephrology  Thrombocytopenia, resolved  Dysphagia At risk for malnutrition -Appreciate SLP's management; dysphagia 3, nectar thick.  Not eating much so far  with TF still running. -Con't TF-- may need to try cutting these back to stimulate her to eat more.    Best Practice: (right click and "Reselect all SmartList Selections" daily)   Diet/type: tubefeeds  , dysphagia 3 DVT prophylaxis: other- bivalirudin GI prophylaxis: PPI Lines: Central line, Dialysis Catheter, Arterial Line, and yes and it is still needed  Foley:  N/A Code Status:  full code Last date of multidisciplinary goals of care discussion [Family updated 3/1 at bedside]    This patient is critically ill with multiple organ system failure which requires frequent high complexity decision making, assessment, support, evaluation, and titration of therapies. This was completed through the application of advanced monitoring technologies and extensive interpretation of multiple databases. During this encounter critical care time was devoted to patient care services described in this note for 36 minutes.  Julian Hy, DO 01/26/22 8:29 AM  Pulmonary & Critical Care

## 2022-01-26 NOTE — Progress Notes (Signed)
ANTICOAGULATION CONSULT NOTE ? ?Pharmacy Consult for bivalirudin ?Indication: history of pulmonary embolus 2021 ? ?Allergies  ?Allergen Reactions  ? Minoxidil Other (See Comments)  ?  Pericardial effusion  ? Lipitor [Atorvastatin] Other (See Comments)  ?  MYALGIAS > "pain in legs" ?Tolerates rosuvastatin  ? Morphine And Related Itching  ? Ace Inhibitors Other (See Comments)  ?  REACTION: "not sure...think it made me drowsy all the time"  ? ? ?Patient Measurements: ?Height: 4' 11.5" (151.1 cm) ?Weight: 70.8 kg (156 lb 1.4 oz) ?IBW/kg (Calculated) : 44.35 ? ?Vital Signs: ?Temp: 99 ?F (37.2 ?C) (03/02 0700) ?BP: 103/63 (03/02 0630) ?Pulse Rate: 94 (03/02 0700) ? ?Labs: ?Recent Labs  ?  01/24/22 ?0354 01/24/22 ?1545 01/31/2022 ?0415 02/18/2022 ?6599 02/02/2022 ?0820 02/06/2022 ?3570 02/15/2022 ?1420 01/27/2022 ?1637 01/26/22 ?0322  ?HGB 7.4*  --  7.6*   < > 8.5* 8.5*  --   --  7.8*  ?HCT 23.8*  --  24.0*   < > 25.0* 25.0*  --   --  25.8*  ?PLT 163  --  165  --   --   --   --   --  156  ?APTT 46*  --  49*  --   --   --  54*  --  53*  ?CREATININE 1.32*   < > 1.23*  --   --   --   --  1.33* 1.24*  ? < > = values in this interval not displayed.  ? ? ? ?Estimated Creatinine Clearance: 38.7 mL/min (A) (by C-G formula based on SCr of 1.24 mg/dL (H)). ? ? ?Assessment: ?66 yo female on chronic apixaban for hx of PE 2021. Apixaban has been held for surgery.  Pharmacy asked to resume anticoagulation 2/15 with bivalirudin.  HIT antibody and SRA both negative.  Platelet count now recovered 50>208>150-160s. ? ?aPTT therapeutic at 53. Pltc stable in the 150s, Hemoglobin dropped from 8.5 to 7.8.   ? ?Per RN, no issues with bleeding. ? ?Goal of Therapy:  ?aPTT goal ~50-70sec - target lower end given low platelet count/risk of post-operative bleeding ?Monitor platelets by anticoagulation protocol: Yes ?  ?Plan:  ?-Continue bival to 0.016 mg/kg/hr ?-aPTT and CBC daily while on bival ?-Monitor for s/x of bleeding ? ?Cathrine Muster, PharmD ?PGY2  Cardiology Pharmacy Resident ?Phone: [phone] ?01/26/2022  8:40 AM ? ?Please check [email or web address] for unit-specific pharmacy phone numbers. ? ? ? ? ? ? ?

## 2022-01-26 NOTE — Plan of Care (Signed)
?  Problem: Education: ?Goal: Knowledge of General Education information will improve ?Description: Including pain rating scale, medication(s)/side effects and non-pharmacologic comfort measures ?Outcome: Progressing ?  ?Problem: Cardiac: ?Goal: Will achieve and/or maintain hemodynamic stability ?Outcome: Progressing ?  ?Problem: Fluid Volume: ?Goal: Hemodynamic stability will improve ?Outcome: Progressing ?  ?

## 2022-01-26 NOTE — Progress Notes (Signed)
?[facility] ?Progress Note  ? ?Assessment/ Plan:   ?OP HD: MWF [facility] ?3:15h   450/A1.5   75.5kg   2K/2Ca bath  P2   AVF  Hep  2500 ?- Hectoral 42mcg IV q HD ?- Sensipar $RemoveBef'90mg'kxVpHaaEdr$  PO q HD ?- Mircera 157mcg IV q 2 weeks (ordered, not yet given. Last Mircera 100 on 12/07/21) ?  ?  ?  ?Assessment/Plan:  ? CAD s/p CABG x3, TR sp TVR:  per CT surgery  ? ESRD:  normally MWF schedule, last [facility] Thursday 2/09. CRRT started 2/11. Did not tolerate CRRT holiday due to ^wob/ hypoxia. Continue CRRT--> once appropriate to switch to IHD will likely need to do it the very next day to maintain volume--> however this is a long way off --> increase UF goal to net neg 50 mL/ hr ?Shock - cardiogenic and septic. Has RV failure- RHC 02/20/2022--> more well-compensated hemodynamics.  Weaning support per AHF ?Morganella bacteremia -  s/p Zosyn ? Anemia CKD: Sq darbe 100ug weekly on Saturday. 1st 2/25. ? Metabolic bone disease: resume home binders and sensipar when taking PO. Cont IV vdra.  ? Infiltrated AVF - AVF infiltrated on 2/7. Post-op AVF is nonfunctional, VVS aware. Planning for [facility] when more stable.  ?Pulm HTN:  Followed at [facility], holding macitenan (opsumit) ? T2DM-per primary service ? Hx of MV replacement - in 2016 ? ?Subjective:   ? ?Seen in room. Milrinone off today.  SOB is still better.    ? ?Objective:   ?BP 108/63   Pulse 91   Temp 98.6 ?F (37 ?C)   Resp (!) 27   Ht 4' 11.5" (1.511 m)   Wt 70.8 kg   SpO2 100%   BMI 31.00 kg/m?  ? ?Physical Exam: ?WER:XVQMG, NAD ?CVS: tachy ?Resp: coarse, much more comfortable today ?Abd: soft ?Ext: 1+ anasarca, worse in R UE where clotted AVF is, L sided fem swan ?ACCESS: nontunneled HD cath R fem ? ?Labs: ?BMET ?Recent Labs  ?Lab 01/23/22 ?0432 01/23/22 ?1730 01/24/22 ?0354 01/24/22 ?1545 02/24/2022 ?0415 02/14/2022 ?8676 02/04/2022 ?0820 02/13/2022 ?1950 02/19/2022 ?1637 01/26/22 ?9326  ?NA 135 135 134* 135 136 136 136 135 136 134*  ?K 4.0 4.5 4.2 4.2 4.4 4.3 4.3 4.4 4.6 4.6  ?CL 101 98 102  102 102  --   --   --  101 100  ?CO2 $Rem'26 26 24 26 24  'PUOn$ --   --   --  24 24  ?GLUCOSE 209* 227* 168* 140* 179*  --   --   --  245* 266*  ?BUN $Rem'16 18 16 14 13  'UTPd$ --   --   --  15 13  ?CREATININE 1.30* 1.46* 1.32* 1.18* 1.23*  --   --   --  1.33* 1.24*  ?CALCIUM 9.3 9.7 9.3 9.5 9.7  --   --   --  9.4 9.4  ?PHOS 3.1 3.3 2.3* 2.3* 2.6  --   --   --  3.0 3.0  ? ?CBC ?Recent Labs  ?Lab 01/23/22 ?0432 01/24/22 ?0354 02/16/2022 ?0415 02/06/2022 ?7124 02/12/2022 ?0820 01/29/2022 ?5809 01/26/22 ?9833  ?WBC 18.0* 15.0* 13.2*  --   --   --  12.6*  ?NEUTROABS 15.0* 11.9* 10.5*  --   --   --  9.9*  ?HGB 7.6* 7.4* 7.6* 8.2* 8.5* 8.5* 7.8*  ?HCT 24.2* 23.8* 24.0* 24.0* 25.0* 25.0* 25.8*  ?MCV 96.0 96.4 97.2  --   --   --  100.0  ?PLT  171 163 165  --   --   --  156  ? ? ?  ?Medications:   ? ? aspirin EC  325 mg Oral Daily  ? Or  ? aspirin  324 mg Per Tube Daily  ? bisacodyl  10 mg Oral Daily  ? Or  ? bisacodyl  10 mg Rectal Daily  ? chlorhexidine gluconate (MEDLINE KIT)  15 mL Mouth Rinse BID  ? Chlorhexidine Gluconate Cloth  6 each Topical Daily  ? darbepoetin (ARANESP) injection - NON-DIALYSIS  100 mcg Subcutaneous Q Sat-1800  ? docusate  200 mg Per Tube Daily  ? doxercalciferol  9 mcg Intravenous Q M,W,F-HD  ? ezetimibe  10 mg Oral Daily  ? feeding supplement (PROSource TF)  45 mL Per Tube BID  ? feeding supplement (VITAL 1.5 CAL)  900 mL Per Tube Q24H  ? insulin aspart  0-20 Units Subcutaneous Q4H  ? insulin aspart  8 Units Subcutaneous Q4H  ? insulin detemir  20 Units Subcutaneous BID  ? latanoprost  1 drop Both Eyes QHS  ? midodrine  15 mg Oral TID WC  ? multivitamin  1 tablet Per Tube QHS  ? pantoprazole sodium  40 mg Per Tube Daily  ? rosuvastatin  10 mg Per Tube QHS  ? sodium chloride flush  10-40 mL Intracatheter Q12H  ? sodium chloride flush  3 mL Intravenous Q12H  ? Thrombi-Pad  1 each Topical Once  ? ? ? ?Madelon Lips, MD ?01/26/2022, 11:15 AM   ?

## 2022-01-26 NOTE — Progress Notes (Signed)
TCTS DAILY ICU PROGRESS NOTE ? ?                 [address] ?           [place] [address] ?         [phone]  ? ?1 Day Post-Op ?Procedure(s) (LRB): ?RIGHT HEART CATH (N/A) ? ?Total Length of Stay:  LOS: 25 days  ? ?Subjective: ?Patient awake and alert this am. She is asking for coffee. ? ?Objective: ?Vital signs in last 24 hours: ?Temp:  [97.2 ?F (36.2 ?C)-99.7 ?F (37.6 ?C)] 99 ?F (37.2 ?C) (03/02 0700) ?Pulse Rate:  [0-102] 94 (03/02 0700) ?Cardiac Rhythm: Normal sinus rhythm (03/01 2000) ?Resp:  [10-36] 21 (03/02 0700) ?BP: (79-115)/(52-103) 103/63 (03/02 0630) ?SpO2:  [88 %-100 %] 99 % (03/02 0700) ?Arterial Line BP: (94-128)/(38-59) 94/38 (03/02 0700) ?Weight:  [70.8 kg] 70.8 kg (03/02 0408) ? ?Filed Weights  ? 01/24/22 0500 02/02/2022 0230 01/26/22 0408  ?Weight: 72 kg 71.2 kg 70.8 kg  ? ? ?Weight change: -0.4 kg  ? ?Hemodynamic parameters for last 24 hours: ?PAP: (39-54)/(19-35) 39/23 ?CVP:  [10 mmHg-18 mmHg] 11 mmHg ?CO:  [4.5 L/min-5.9 L/min] 5.6 L/min ?CI:  [2.7 L/min/m2-3.6 L/min/m2] 3.4 L/min/m2 ? ?Intake/Output from previous day: ?03/01 0701 - 03/02 0700 ?In: 2410.7 [P.O.:120; I.V.:1564.4; NG/GT:726.3] ?Out: 2700 [Stool:275] ? ?Intake/Output this shift: ?No intake/output data recorded. ? ?Current Meds: ?Scheduled Meds: ? aspirin EC  325 mg Oral Daily  ? Or  ? aspirin  324 mg Per Tube Daily  ? bisacodyl  10 mg Oral Daily  ? Or  ? bisacodyl  10 mg Rectal Daily  ? chlorhexidine gluconate (MEDLINE KIT)  15 mL Mouth Rinse BID  ? Chlorhexidine Gluconate Cloth  6 each Topical Daily  ? darbepoetin (ARANESP) injection - NON-DIALYSIS  100 mcg Subcutaneous Q Sat-1800  ? docusate  200 mg Per Tube Daily  ? doxercalciferol  9 mcg Intravenous Q M,W,F-HD  ? feeding supplement (PROSource TF)  45 mL Per Tube BID  ? insulin aspart  0-20 Units Subcutaneous Q4H  ? insulin aspart  8 Units Subcutaneous Q4H  ? insulin detemir  20 Units Subcutaneous BID  ? latanoprost  1 drop Both Eyes QHS  ? midodrine  15  mg Oral TID WC  ? multivitamin  1 tablet Per Tube QHS  ? pantoprazole sodium  40 mg Per Tube Daily  ? rosuvastatin  10 mg Per Tube QHS  ? sodium chloride flush  10-40 mL Intracatheter Q12H  ? sodium chloride flush  3 mL Intravenous Q12H  ? Thrombi-Pad  1 each Topical Once  ? ?Continuous Infusions: ?  prismasol BGK 4/2.5 500 mL/hr at 01/26/22 0092  ?  prismasol BGK 4/2.5 500 mL/hr at 01/26/22 3300  ? sodium chloride    ? sodium chloride 10 mL/hr at 01/26/22 0700  ? sodium chloride 10 mL/hr at 01/26/22 0700  ? sodium chloride 10 mL/hr at 01/26/22 0700  ? amiodarone 30 mg/hr (01/26/22 0726)  ? bivalirudin (ANGIOMAX) infusion 0.5 mg/mL (Non-ACS indications) 0.016 mg/kg/hr (01/26/22 0700)  ? epinephrine 8 mcg/min (01/26/22 0700)  ? feeding supplement (VITAL 1.5 CAL) 50 mL/hr at 01/26/22 0259  ? milrinone Stopped (02/02/2022 1414)  ? norepinephrine (LEVOPHED) Adult infusion 2 mcg/min (01/26/22 0700)  ? prismasol BGK 4/2.5 1,400 mL/hr at 01/26/22 0447  ? vasopressin 0.04 Units/min (01/26/22 0700)  ? ?PRN Meds:.sodium chloride, sodium chloride, alteplase, food thickener, Gerhardt's butt cream, heparin, hydrOXYzine, ondansetron (ZOFRAN) IV,  oxyCODONE, sodium chloride, sodium chloride flush, sodium chloride flush, traMADol ? ?Neurologic: intact ?Heart:RRR ?Lungs: Mostly clear  ?Abdomen: Soft, obese, non tender, bowel sounds ?Extremities: mild bilateral LE edema. Right forearm ++ edema (AVF) ?Wound: Sternal and RLE wounds clean, dry, well healed ? ?Lab Results: ?CBC: ?Recent Labs  ?  02/15/2022 ?0415 02/15/2022 ?1275 01/31/2022 ?1700 01/26/22 ?1749  ?WBC 13.2*  --   --  12.6*  ?HGB 7.6*   < > 8.5* 7.8*  ?HCT 24.0*   < > 25.0* 25.8*  ?PLT 165  --   --  156  ? < > = values in this interval not displayed.  ? ? ?BMET:  ?Recent Labs  ?  02/16/2022 ?1637 01/26/22 ?0322  ?NA 136 134*  ?K 4.6 4.6  ?CL 101 100  ?CO2 24 24  ?GLUCOSE 245* 266*  ?BUN 15 13  ?CREATININE 1.33* 1.24*  ?CALCIUM 9.4 9.4  ? ?  ?CMET: ?Lab Results  ?Component Value Date   ? WBC 12.6 (H) 01/26/2022  ? HGB 7.8 (L) 01/26/2022  ? HCT 25.8 (L) 01/26/2022  ? PLT 156 01/26/2022  ? GLUCOSE 266 (H) 01/26/2022  ? CHOL 227 (H) 12/08/2021  ? TRIG 103 12/08/2021  ? HDL 50 12/08/2021  ? LDLCALC 155 (H) 12/08/2021  ? ALT 78 (H) 01/23/2022  ? AST 99 (H) 01/23/2022  ? NA 134 (L) 01/26/2022  ? K 4.6 01/26/2022  ? CL 100 01/26/2022  ? CREATININE 1.24 (H) 01/26/2022  ? BUN 13 01/26/2022  ? CO2 24 01/26/2022  ? TSH 2.67 12/08/2021  ? INR 1.5 (H) 01/11/2022  ? HGBA1C 5.6 01/05/2022  ? MICROALBUR 53.5 (H) 03/29/2015  ? ? ? ? ?PT/INR: No results for input(s): LABPROT, INR in the last 72 hours. ?Radiology: CARDIAC CATHETERIZATION ? ?Result Date: 01/29/2022 ?Findings: On NE 2 EPi 3 Milrinone 0.25 VP 0.02 Ao = 104/48 (63) RA = 11 RV = 35/10 PA = 53/26 (34) PCW = 17 Fick cardiac output/index = 6.4/3.8 Thermal CO/CI = 5.2/3.1 PVR (Fick) = 2.7 PVR (Therm) = 3.3 SVR (Fick) = 650 SVR (Therm) = 807 Ao sat = 92% PA sat = 58%, 57% Assessment: Surprisingly well-compensated hemodynamics on 4 vasopressors/inotropes. Volume status much improved Plan/Discussion: Will wean pressors using swan guidance. Cut milrinone to 0.125. Wean NE next.  Keep VP for now with low SVR. Glori Bickers, MD 8:45 AM ? ?Port CXR ? ?Result Date: 02/17/2022 ?CLINICAL DATA:  Central line placement EXAM: PORTABLE CHEST 1 VIEW COMPARISON:  01/21/2022 FINDINGS: Nasogastric tube projecting over the stomach. Femoral insertion of a Swan-Ganz catheter with the tip projecting over the left main pulmonary artery. Bilateral mild interstitial thickening. Left basilar airspace disease likely reflecting atelectasis. No pleural effusion or pneumothorax. Stable cardiomegaly. Prior CABG. Prior tricuspid and mitral valve replacement. No acute osseous abnormality. IMPRESSION: 1. Femoral insertion of a Swan-Ganz catheter with the tip projecting over the left main pulmonary artery. 2. Nasogastric tube projecting over the stomach. 3. Cardiomegaly with mild pulmonary  vascular congestion. Electronically Signed   By: Kathreen Devoid M.D.   On: 02/21/2022 10:04   ? ? ?Assessment/Plan: ?S/P Procedure(s) (LRB): ?RIGHT HEART CATH (N/A) ?CV-Previous a fib. SR this am. PAH with cor pulmonale. On Midodrine 15 mg tid. Milrinone stopped yesterday. Co ox this am 72.7. On Amiodarone, Epi, Nor Epi,  and Vasopressin 0.04. Co ox this am increased to 76.1 ?Pulmonary-on Bipap at night. On 3 liters via [place]. Pulmonary/CCM following. Encourage incentive spirometer ?GI-NPO (small sips of honey thick  liquids or bites of purees), on tube feedings. SLP following and recommendations followed accordingly. ?5. History of PE-on Bivalirudin ?6. Anemia of chronic disease-H and H this am decreased to 7.8 and 25.8. On Aranesp.  ?7. History of ESRD-Creatinine this am 1.24 Nephrology following and arranging for CRRT. Has left femoral catheter in place as has infiltrated rit AVF ?8. History of DM-CBGs 175/133/244. On Insulin. Pre op HGA1C 6.1.  ? ? ?Cheney Ewart Liston Alba PA-C ?01/26/2022 7:48 AM  ? ? ? ? ? ?

## 2022-01-26 NOTE — Progress Notes (Signed)
Speech Language Pathology Treatment: Dysphagia  ?Patient Details ?Name: Jody Nguyen ?MRN: [id number] ?DOB: 25-Feb-1956 ?Today's Date: 01/26/2022 ?Time: 1420-1430 ?SLP Time Calculation (min) (ACUTE ONLY): 10 min ? ?Assessment / Plan / Recommendation ?Clinical Impression ? Pt was seen this afternoon with trials of regular solids and nectar thick liquids. No overt s/s of aspiration were noted, but she did benefit from breaking the solids into smaller, bite-sized pieces for ease of oral preparation. Per RN, intake has been very limited, but she does not seem to have overt difficulty with meal trays (note also that they are adjusting TFs). Pt denies feeling like it is too effortful to eat mechanical soft foods, and attributes low intake to distaste. Will therefore continue with Dys 3 solids and nectar thick liquids.  ?  ?HPI HPI: 66 yo F presented to [facility] ED 2/4 with chest pain. ECHO revealed severe RV failure, felt to be  consistent with chronic process such as severe pulmonary HTN. 2/7 cardiac cath - three vessel dz;  2/10 CABGx3, TVR; 2/13 chest tube removed; ETT 2/10-2/16; CVVHD.  PMH ESRD on HD, non-obstructive CAD, suspected pulmonary hypertension, Hx PE on eliquis. Passed [place] on 2/16; RN had concerns for potential dysphagia and requested SLP swallow consult. ?  ?   ?SLP Plan ? Continue with current plan of care ? ?  ?  ?Recommendations for follow up therapy are one component of a multi-disciplinary discharge planning process, led by the attending physician.  Recommendations may be updated based on patient status, additional functional criteria and insurance authorization. ?  ? ?Recommendations  ?Diet recommendations: Dysphagia 3 (mechanical soft);Nectar-thick liquid ?Liquids provided via: Cup;Straw ?Medication Administration: Whole meds with puree ?Supervision: Staff to assist with self feeding ?Compensations: Slow rate;Small sips/bites;Minimize environmental distractions ?Postural Changes and/or  Swallow Maneuvers: Seated upright 90 degrees (as upright as able while on CRRT)  ?   ?    ?   ? ? ? ? Oral Care Recommendations: Oral care BID ?Follow Up Recommendations: Acute inpatient rehab (3hours/day) ?Assistance recommended at discharge: Frequent or constant Supervision/Assistance ?SLP Visit Diagnosis: Dysphagia, pharyngeal phase (R13.13) ?Plan: Continue with current plan of care ? ? ? ? ?  ?  ? ? ?[name]., M.A. CCC-SLP ?Acute Rehabilitation Services ?Pager [phone] ?Office [phone] ? ?01/26/2022, 2:34 PM ?

## 2022-01-26 NOTE — Progress Notes (Addendum)
ANTICOAGULATION CONSULT NOTE ? ?Pharmacy Consult for bivalirudin>>heparin ?Indication: history of pulmonary embolus 2021 ? ?Allergies  ?Allergen Reactions  ? Minoxidil Other (See Comments)  ?  Pericardial effusion  ? Lipitor [Atorvastatin] Other (See Comments)  ?  MYALGIAS > "pain in legs" ?Tolerates rosuvastatin  ? Morphine And Related Itching  ? Ace Inhibitors Other (See Comments)  ?  REACTION: "not sure...think it made me drowsy all the time"  ? ? ?Patient Measurements: ?Height: 4' 11.5" (151.1 cm) ?Weight: 70.8 kg (156 lb 1.4 oz) ?IBW/kg (Calculated) : 44.35 ?Heparin DW: 70.8kg  ? ?Vital Signs: ?Temp: 99 ?F (37.2 ?C) (03/02 2200) ?Temp Source: Core (03/02 2000) ?BP: 91/55 (03/02 2200) ?Pulse Rate: 90 (03/02 2200) ? ?Labs: ?Recent Labs  ?  01/24/22 ?0354 01/24/22 ?1545 01/28/2022 ?0415 02/10/2022 ?3435 02/21/2022 ?0820 02/03/2022 ?6861 01/27/2022 ?1420 02/14/2022 ?1637 01/26/22 ?0322 01/26/22 ?1542 01/26/22 ?2152  ?HGB 7.4*  --  7.6*   < > 8.5* 8.5*  --   --  7.8*  --   --   ?HCT 23.8*  --  24.0*   < > 25.0* 25.0*  --   --  25.8*  --   --   ?PLT 163  --  165  --   --   --   --   --  156  --   --   ?APTT 46*  --  49*  --   --   --  54*  --  53*  --   --   ?HEPARINUNFRC  --   --   --   --   --   --   --   --   --   --  0.21*  ?CREATININE 1.32*   < > 1.23*  --   --   --   --  1.33* 1.24* 1.21*  --   ? < > = values in this interval not displayed.  ? ? ? ?Estimated Creatinine Clearance: 39.7 mL/min (A) (by C-G formula based on SCr of 1.21 mg/dL (H)). ? ? ?Assessment: ?66 yo female on chronic apixaban for hx of PE 2021. Apixaban has been held for surgery.  Pharmacy asked to resume anticoagulation 2/15 with bivalirudin.  HIT antibody and SRA both negative.  Platelet count now recovered 50>208>150-160s.  ? ?Patient is therapeutic on bivalirudin. No s/x of bleeding per RN.  ? ?Pharmacy now requested to switch anticoagulation to heparin as HIT work-up negative. Will start at lower end with no bolus.  ? ?Heparin level on check tonight  was low at 0.21 on 1000 units/hr. No issues noted per nursing. Will increase to 1100 units/hr.  ? ?Repeat heparin level 0.3 on 1100 units/hr.  ? ?Goal of Therapy:  ?Heparin level 0.3-0.5 - target lower end given low platelet count/risk of post-operative bleeding ?Monitor platelets by anticoagulation protocol: Yes ?  ?Plan:  ?-Heparin at 1100 units/hr ?-HL and CBC daily while on heparin ?-Monitor for s/x of bleeding ? ?Erin Hearing PharmD., BCPS ?Clinical Pharmacist ?01/26/2022 11:21 PM ? ?

## 2022-01-27 DIAGNOSIS — R570 Cardiogenic shock: Secondary | ICD-10-CM | POA: Diagnosis not present

## 2022-01-27 DIAGNOSIS — J9601 Acute respiratory failure with hypoxia: Secondary | ICD-10-CM | POA: Diagnosis not present

## 2022-01-27 LAB — RENAL FUNCTION PANEL
Albumin: 2.6 g/dL — ABNORMAL LOW (ref 3.5–5.0)
Albumin: 2.7 g/dL — ABNORMAL LOW (ref 3.5–5.0)
Anion gap: 8 (ref 5–15)
Anion gap: 8 (ref 5–15)
BUN: 12 mg/dL (ref 8–23)
BUN: 9 mg/dL (ref 8–23)
CO2: 25 mmol/L (ref 22–32)
CO2: 27 mmol/L (ref 22–32)
Calcium: 9.5 mg/dL (ref 8.9–10.3)
Calcium: 9.5 mg/dL (ref 8.9–10.3)
Chloride: 102 mmol/L (ref 98–111)
Chloride: 102 mmol/L (ref 98–111)
Creatinine, Ser: 1.12 mg/dL — ABNORMAL HIGH (ref 0.44–1.00)
Creatinine, Ser: 1.06 mg/dL — ABNORMAL HIGH (ref 0.44–1.00)
GFR, Estimated: 54 mL/min — ABNORMAL LOW (ref >60)
GFR, Estimated: 58 mL/min — ABNORMAL LOW (ref >60)
Glucose, Bld: 72 mg/dL (ref 70–99)
Glucose, Bld: 101 mg/dL — ABNORMAL HIGH (ref 70–99)
Phosphorus: 2 mg/dL — ABNORMAL LOW (ref 2.5–4.6)
Phosphorus: 2.7 mg/dL (ref 2.5–4.6)
Potassium: 4.2 mmol/L (ref 3.5–5.1)
Potassium: 4.5 mmol/L (ref 3.5–5.1)
Sodium: 135 mmol/L (ref 135–145)
Sodium: 137 mmol/L (ref 135–145)

## 2022-01-27 LAB — COOXEMETRY PANEL
Carboxyhemoglobin: 2.2 % — ABNORMAL HIGH (ref 0.5–1.5)
Carboxyhemoglobin: 2.1 % — ABNORMAL HIGH (ref 0.5–1.5)
Carboxyhemoglobin: 1.6 % — ABNORMAL HIGH (ref 0.5–1.5)
Carboxyhemoglobin: 1.9 % — ABNORMAL HIGH (ref 0.5–1.5)
Methemoglobin: <0.7 % (ref 0.0–1.5)
Methemoglobin: <0.7 % (ref 0.0–1.5)
Methemoglobin: <0.7 % (ref 0.0–1.5)
Methemoglobin: <0.7 % (ref 0.0–1.5)
O2 Saturation: 49.1 %
O2 Saturation: 43.6 %
O2 Saturation: 41.2 %
O2 Saturation: 44 %
Total hemoglobin: 7.6 g/dL — ABNORMAL LOW (ref 12.0–16.0)
Total hemoglobin: 7.2 g/dL — ABNORMAL LOW (ref 12.0–16.0)
Total hemoglobin: 8.1 g/dL — ABNORMAL LOW (ref 12.0–16.0)
Total hemoglobin: 7.9 g/dL — ABNORMAL LOW (ref 12.0–16.0)

## 2022-01-27 LAB — CBC WITH DIFFERENTIAL/PLATELET
Abs Immature Granulocytes: 0.05 10*3/uL (ref 0.00–0.07)
Basophils Absolute: 0.1 10*3/uL (ref 0.0–0.1)
Basophils Relative: 1 %
Eosinophils Absolute: 1.1 10*3/uL — ABNORMAL HIGH (ref 0.0–0.5)
Eosinophils Relative: 8 %
HCT: 24.9 % — ABNORMAL LOW (ref 36.0–46.0)
Hemoglobin: 7.7 g/dL — ABNORMAL LOW (ref 12.0–15.0)
Immature Granulocytes: 0 %
Lymphocytes Relative: 10 %
Lymphs Abs: 1.3 10*3/uL (ref 0.7–4.0)
MCH: 30.7 pg (ref 26.0–34.0)
MCHC: 30.9 g/dL (ref 30.0–36.0)
MCV: 99.2 fL (ref 80.0–100.0)
Monocytes Absolute: 1 10*3/uL (ref 0.1–1.0)
Monocytes Relative: 7 %
Neutro Abs: 9.8 10*3/uL — ABNORMAL HIGH (ref 1.7–7.7)
Neutrophils Relative %: 74 %
Platelets: 141 10*3/uL — ABNORMAL LOW (ref 150–400)
RBC: 2.51 MIL/uL — ABNORMAL LOW (ref 3.87–5.11)
RDW: 25.2 % — ABNORMAL HIGH (ref 11.5–15.5)
WBC: 13.3 10*3/uL — ABNORMAL HIGH (ref 4.0–10.5)
nRBC: 2.2 % — ABNORMAL HIGH (ref 0.0–0.2)

## 2022-01-27 LAB — GLUCOSE, CAPILLARY
Glucose-Capillary: 74 mg/dL (ref 70–99)
Glucose-Capillary: 88 mg/dL (ref 70–99)
Glucose-Capillary: 204 mg/dL — ABNORMAL HIGH (ref 70–99)
Glucose-Capillary: 231 mg/dL — ABNORMAL HIGH (ref 70–99)
Glucose-Capillary: 153 mg/dL — ABNORMAL HIGH (ref 70–99)
Glucose-Capillary: 100 mg/dL — ABNORMAL HIGH (ref 70–99)
Glucose-Capillary: 138 mg/dL — ABNORMAL HIGH (ref 70–99)
Glucose-Capillary: 91 mg/dL (ref 70–99)

## 2022-01-27 LAB — VITAMIN B12: Vitamin B-12: 1742 pg/mL — ABNORMAL HIGH (ref 180–914)

## 2022-01-27 LAB — HEPARIN LEVEL (UNFRACTIONATED)
Heparin Unfractionated: 0.3 IU/mL (ref 0.30–0.70)
Heparin Unfractionated: 0.39 IU/mL (ref 0.30–0.70)

## 2022-01-27 LAB — FERRITIN: Ferritin: 1700 ng/mL — ABNORMAL HIGH (ref 11–307)

## 2022-01-27 LAB — RETICULOCYTES
Immature Retic Fract: 38.6 % — ABNORMAL HIGH (ref 2.3–15.9)
RBC.: 2.45 MIL/uL — ABNORMAL LOW (ref 3.87–5.11)
Retic Count, Absolute: 249 10*3/uL — ABNORMAL HIGH (ref 19.0–186.0)
Retic Ct Pct: 10.1 % — ABNORMAL HIGH (ref 0.4–3.1)

## 2022-01-27 LAB — MAGNESIUM: Magnesium: 2.4 mg/dL (ref 1.7–2.4)

## 2022-01-27 LAB — IRON AND TIBC
Iron: 77 ug/dL (ref 28–170)
Saturation Ratios: 22 % (ref 10.4–31.8)
TIBC: 343 ug/dL (ref 250–450)
UIBC: 266 ug/dL

## 2022-01-27 LAB — FOLATE
Folate: 23.3 ng/mL (ref >5.9)
Folate: 15.8 ng/mL (ref >5.9)

## 2022-01-27 MED ORDER — BISACODYL 5 MG PO TBEC
10.0000 mg | DELAYED_RELEASE_TABLET | Freq: Every day | ORAL | Status: DC | PRN
Start: 2022-01-27 — End: 2022-02-04

## 2022-01-27 MED ORDER — CHLORHEXIDINE GLUCONATE 0.12 % MT SOLN
OROMUCOSAL | Status: AC
  Administered 2022-01-27: 15 mL via OROMUCOSAL
  Filled 2022-01-27: qty 15

## 2022-01-27 MED ORDER — BISACODYL 10 MG RE SUPP
10.0000 mg | Freq: Every day | RECTAL | Status: DC | PRN
Start: 2022-01-27 — End: 2022-02-04

## 2022-01-27 MED ORDER — LORAZEPAM 2 MG/ML IJ SOLN
1.0000 mg | Freq: Once | INTRAMUSCULAR | Status: AC
  Administered 2022-01-27: 1 mg via INTRAVENOUS
  Filled 2022-01-27: qty 1

## 2022-01-27 MED ORDER — INSULIN DETEMIR 100 UNIT/ML ~~LOC~~ SOLN
5.0000 [IU] | Freq: Two times a day (BID) | SUBCUTANEOUS | Status: DC
  Administered 2022-01-27: 5 [IU] via SUBCUTANEOUS
  Filled 2022-01-27 (×3): qty 0.05

## 2022-01-27 MED ORDER — DOCUSATE SODIUM 50 MG/5ML PO LIQD: 200.0000 mg | Freq: Every day | ORAL | Status: DC | PRN

## 2022-01-27 NOTE — Progress Notes (Signed)
PT awake and alert on 2LNC. Tolerating well. Bipap not at bedside. No resp distress noted at this time. Will continue to monitor.  ?

## 2022-01-27 NOTE — Progress Notes (Signed)
? ?   ?  [address] ?      [place] [address] ?            [phone]   ? ?  ?Sleeping ? ?BP (!) 87/60 (BP Location: Left Arm)   Pulse 81   Temp (!) 97.5 ?F (36.4 ?C)   Resp 14   Ht 4' 11.5" (1.511 m)   Wt 70.8 kg   SpO2 100%   BMI 31.00 kg/m?  ?Ci 2.1 back on epi @ 4 ? ?Intake/Output Summary (Last 24 hours) at 01/27/2022 1724 ?Last data filed at 01/27/2022 1600 ?Gross per 24 hour  ?Intake 2842 ml  ?Output 3347 ml  ?Net -505 ml  ? ?Continue current Rx ? ?Revonda Standard. Roxan Hockey, MD ?[facility] ?[phone] ? ?

## 2022-01-27 NOTE — Progress Notes (Signed)
?[facility] ?Progress Note  ? ?Assessment/ Plan:   ?OP HD: MWF [facility] ?3:15h   450/A1.5   75.5kg   2K/2Ca bath  P2   AVF  Hep  2500 ?- Hectoral 23mcg IV q HD ?- Sensipar $RemoveBef'90mg'GSfWhBwGuZ$  PO q HD ?- Mircera 125mcg IV q 2 weeks (ordered, not yet given. Last Mircera 100 on 12/07/21) ?  ?  ?  ?Assessment/Plan:  ? CAD s/p CABG x3, TR sp TVR:  per CT surgery  ? ESRD:  normally MWF schedule, last [facility] Thursday 2/09. CRRT started 2/11. Did not tolerate CRRT holiday due to ^wob/ hypoxia. Continue CRRT--> once appropriate to switch to IHD will likely need to do it the very next day to maintain volume--> however this is a long way off --> increase UF goal to net neg 50 mL/ hr, up to 100 mL net neg if can tolerate if OK with AHF ?Shock - cardiogenic and septic. Has RV failure- RHC 01/27/2022--> more well-compensated hemodynamics.  Weaning support per AHF ?Morganella bacteremia -  s/p Zosyn ? Anemia CKD: Sq darbe 100ug weekly on Saturday. 1st 2/25. ? Metabolic bone disease: resume home binders and sensipar when taking PO. Cont IV vdra.  ? Infiltrated AVF - AVF infiltrated on 2/7. Post-op AVF is nonfunctional, VVS aware. Planning for [facility] when more stable.  ?Pulm HTN:  Followed at [facility], holding macitenan (opsumit) ? T2DM-per primary service ? Hx of MV replacement - in 2016 ?Dispo: ICU ? ?Subjective:   ? ?Off epi overnight but co-ox only in the 40s- epi added back today.    ? ?Objective:   ?BP 106/72 (BP Location: Left Arm)   Pulse 84   Temp (!) 96.8 ?F (36 ?C) (Core)   Resp 16   Ht 4' 11.5" (1.511 m)   Wt 70.8 kg   SpO2 93%   BMI 31.00 kg/m?  ? ?Physical Exam: ?XKG:YJEHU, NAD ?CVS: tachy ?Resp: coarse, much more comfortable today ?Abd: soft ?Ext: 1+ anasarca, worse in R UE where clotted AVF is, L sided fem swan ?ACCESS: nontunneled HD cath R fem ? ?Labs: ?BMET ?Recent Labs  ?Lab 01/24/22 ?0354 01/24/22 ?1545 02/16/2022 ?0415 02/04/2022 ?3149 02/06/2022 ?0820 02/03/2022 ?7026 02/08/2022 ?1637 01/26/22 ?3785 01/26/22 ?1542 01/27/22 ?0310   ?NA 134* 135 136 136 136 135 136 134* 138 135  ?K 4.2 4.2 4.4 4.3 4.3 4.4 4.6 4.6 4.2 4.2  ?CL 102 102 102  --   --   --  101 100 103 102  ?CO2 $Rem'24 26 24  'cxqK$ --   --   --  $R'24 24 27 25  'Ic$ ?GLUCOSE 168* 140* 179*  --   --   --  245* 266* 80 72  ?BUN $Rem'16 14 13  'IPMT$ --   --   --  $R'15 13 13 12  'no$ ?CREATININE 1.32* 1.18* 1.23*  --   --   --  1.33* 1.24* 1.21* 1.12*  ?CALCIUM 9.3 9.5 9.7  --   --   --  9.4 9.4 9.5 9.5  ?PHOS 2.3* 2.3* 2.6  --   --   --  3.0 3.0 2.5 2.0*  ? ?CBC ?Recent Labs  ?Lab 01/24/22 ?0354 02/21/2022 ?0415 02/14/2022 ?8850 02/03/2022 ?0820 02/06/2022 ?2774 01/26/22 ?1287 01/27/22 ?0310  ?WBC 15.0* 13.2*  --   --   --  12.6* 13.3*  ?NEUTROABS 11.9* 10.5*  --   --   --  9.9* 9.8*  ?HGB 7.4* 7.6*   < > 8.5* 8.5* 7.8* 7.7*  ?  HCT 23.8* 24.0*   < > 25.0* 25.0* 25.8* 24.9*  ?MCV 96.4 97.2  --   --   --  100.0 99.2  ?PLT 163 165  --   --   --  156 141*  ? < > = values in this interval not displayed.  ? ? ?  ?Medications:   ? ? aspirin EC  325 mg Oral Daily  ? Or  ? aspirin  324 mg Per Tube Daily  ? bisacodyl  10 mg Oral Daily  ? Or  ? bisacodyl  10 mg Rectal Daily  ? chlorhexidine gluconate (MEDLINE KIT)  15 mL Mouth Rinse BID  ? Chlorhexidine Gluconate Cloth  6 each Topical Daily  ? darbepoetin (ARANESP) injection - NON-DIALYSIS  100 mcg Subcutaneous Q Sat-1800  ? docusate  200 mg Per Tube Daily  ? doxercalciferol  9 mcg Intravenous Q M,W,F-HD  ? ezetimibe  10 mg Oral Daily  ? feeding supplement (PROSource TF)  45 mL Per Tube BID  ? feeding supplement (VITAL 1.5 CAL)  900 mL Per Tube Q24H  ? insulin aspart  0-20 Units Subcutaneous Q4H  ? insulin aspart  8 Units Subcutaneous Q4H  ? insulin detemir  20 Units Subcutaneous BID  ? latanoprost  1 drop Both Eyes QHS  ? midodrine  15 mg Oral TID  ? multivitamin  1 tablet Per Tube QHS  ? pantoprazole sodium  40 mg Per Tube Daily  ? rosuvastatin  10 mg Per Tube QHS  ? sodium chloride flush  10-40 mL Intracatheter Q12H  ? sodium chloride flush  3 mL Intravenous Q12H  ? Thrombi-Pad  1 each  Topical Once  ? ? ? ?Madelon Lips, MD ?01/27/2022, 9:43 AM   ?

## 2022-01-27 NOTE — Progress Notes (Addendum)
ANTICOAGULATION CONSULT NOTE ? ?Pharmacy Consult for bivalirudin>>heparin ?Indication: history of pulmonary embolus 2021 ? ?Allergies  ?Allergen Reactions  ? Minoxidil Other (See Comments)  ?  Pericardial effusion  ? Lipitor [Atorvastatin] Other (See Comments)  ?  MYALGIAS > "pain in legs" ?Tolerates rosuvastatin  ? Morphine And Related Itching  ? Ace Inhibitors Other (See Comments)  ?  REACTION: "not sure...think it made me drowsy all the time"  ? ? ?Patient Measurements: ?Height: 4' 11.5" (151.1 cm) ?Weight: 70.8 kg (156 lb 1.4 oz) ?IBW/kg (Calculated) : 44.35 ?Heparin DW: 70.8kg  ? ?Vital Signs: ?Temp: 98.1 ?F (36.7 ?C) (03/03 1100) ?Temp Source: Core (03/03 0800) ?BP: 95/62 (03/03 1100) ?Pulse Rate: 82 (03/03 1100) ? ?Labs: ?Recent Labs  ?  02/21/2022 ?0415 02/08/2022 ?7001 02/16/2022 ?7494 02/16/2022 ?1420 02/19/2022 ?1637 01/26/22 ?0322 01/26/22 ?1542 01/26/22 ?2152 01/27/22 ?0310 01/27/22 ?4967  ?HGB 7.6*   < > 8.5*  --   --  7.8*  --   --  7.7*  --   ?HCT 24.0*   < > 25.0*  --   --  25.8*  --   --  24.9*  --   ?PLT 165  --   --   --   --  156  --   --  141*  --   ?APTT 49*  --   --  54*  --  53*  --   --   --   --   ?HEPARINUNFRC  --   --   --   --   --   --   --  0.21* 0.30 0.39  ?CREATININE 1.23*  --   --   --    < > 1.24* 1.21*  --  1.12*  --   ? < > = values in this interval not displayed.  ? ? ? ?Estimated Creatinine Clearance: 42.9 mL/min (A) (by C-G formula based on SCr of 1.12 mg/dL (H)). ? ? ?Assessment: ?66 yo female on chronic apixaban for hx of PE 2021. Apixaban has been held for surgery.  Pharmacy asked to resume anticoagulation 2/15 with bivalirudin.  HIT antibody and SRA both negative.  Platelet count now recovered 50>208>150-160s.  ? ?Patient back on heparin (s/p bivalirudin) as HIT work-up negative. ?Heparin level on check today  was therapeutic at 0.39 on 1100 units/hr. No issues noted per nursing. Hgb low at 7.7, but stable. Platelets dropped to 141, will monitor with recent switch back to heparin.   ? ?Goal of Therapy:  ?Heparin level 0.3-0.5 - target lower end given low platelet count/risk of post-operative bleeding ?Monitor platelets by anticoagulation protocol: Yes ?  ?Plan:  ?-Heparin at 1100 units/hr ?-HL and CBC daily while on heparin ?-Monitor for s/x of bleeding ? ?Cathrine Muster, PharmD ?PGY2 Cardiology Pharmacy Resident ?Phone: [phone] ? ?01/27/2022  11:08 AM ? ?Please check [email or web address] for unit-specific pharmacy phone numbers. ? ? ?

## 2022-01-27 NOTE — Progress Notes (Signed)
NAME:  Jody Nguyen, MRN:  [id number], DOB:  [birth year], LOS: 80 ADMISSION DATE:  01/04/2022, CONSULTATION DATE:  01/18/2022 REFERRING MD:  Marlowe Sax - TRH, CHIEF COMPLAINT:  SOB   History of Present Illness:  66 year old woman who presented to [facility] ED 2/4 with CP radiating to her arm. PMHx significant for  ESRD (on HD), non-obstructive CAD, suspected pulmonary hypertension, PE (on Eliquis). Recent ED presentation 2/1 for similar symptoms (troponin 100s). F/u with Cardiology as an outpatient 2/2 and CP was felt to be related to volume shift (occurring during dialysis).   On ED presentation 2/4, patient reported episodic chest pain, non-exertional and intermittent x 1 week with associated nausea and SOB. Non-specific EKG changes. Cardiology consulted, STEMI ruled out. Troponin elevated at 200. Placed on [place] for SpO2 80s. Admitted to [facility] for observation with concern for PE and started on heparin gtt. VQ with low probability for PE. Echo 2/19 demonstrated severe RV/RA enlargement, severely reduced RV function and septal bowing; felt to be consistent with chronic process such as severe pulmonary HTN.    PCCM has been consulted in this setting.  Pertinent Medical History:  ESRD PE Pulmonary HTN  CAD CVA HLD HTN S/p MV repair  Pericardial effusion r/t minoxidil  Diastolic HF DM2  Significant Hospital Events: Including procedures, antibiotic start and stop dates in addition to other pertinent events   2/4 ED presentation with recurring chest pain -- STEMI ruled out. Stable, mildly hypoxic and placed on [place] 2/5 Admit to [facility] for obvs. Concern for PE, ECHO performed by cardiology reportedly shows RV failure, but felt more consistent with chronic process like severe pulmHTN. Awaiting VQ scan. Pulm consulted  2/7 Cardiac cath. 3 vessel dz. CABG rec 2/10 CABG, TVR 2/11 Continued vasoplegic shock with very high vasopressor requirements  2/12 Given methylene blue x 2 2/13 Chest tube removed 2/14 JP  removed 2/15 pacing wires removed 2/16 milrinone stopped, extubated 2/17 Zosyn for GNR bacteremia-> Morganella morganii 2/18 Arterial line, central line, vascath all removed and replaced. 2/19 Milrinone back on. Co-ox improved. ECHO Left ventricular ejection fraction, by estimation, is 50 to 55%. The left ventricle has low normal function. No regional WM abnormality. Right Ventricle: Ventricular septum is flattened in systole and diastole  suggesting RV pressure and volume overload. The right ventricular size is  severely enlarged. Right vetricular wall thickness was not well  visualized. Right ventricular systolic  function is severely reduced. There is moderately elevated pulmonary  artery systolic pressure 3/15 still high pressor needs. CRRT stopped. Having intermittent bouts of tachycardia 2/21 Hemodynamics better. Weaning Epi. Still on Norepi and inotropic support. Supported on BIPAP over night and tolerated well. On am rounds more lethargic w/ generalized pain everywhere. Co-ox > 70. Glucose poorly controlled. 2/22 Escalating pressor requirements (NE, Epi, Vaso). Continues on milrinone. Afib with RVR overnight, now with persistent AF (on amio, bival). Co-ox 73%. Hgb 7.3. WBC uptrending (25 from 20), Cr uptrending, CRRT restarted. Tolerating BiPAP. 2/23 Improving pressor needs. Stable Epi/Vaso, downtitrating NE. Milrinone 0.25. Persistent AF, rate controlled. Co-ox 65.7%. Ongoing CRRT. BiPAP QHS. 2/24 Continued decreasing pressor needs, stable epi/vaso, downtitrating Levo.  Milrinone 0.25.  Intermittent A-fib, in and out of NSR, rates 90s.  Co-ox 76.1%.  Ongoing CRRT, did not utilize BiPAP overnight. FEES with SLP. 2/25 confused.  Pressor requirements down some.  Got 1 unit of blood for hemoglobin of 7, Co. ox dropped. Vasopressin decreased to 0.03 units.  3/2 stopped epinephrine, Co. oximetry dropped quickly 3/3 back  on epinephrine, with plan to eventually titrate vasopressin off and trial  dobutamine.  Currently on room air.  Co. oximetry's in the 40s, hypoglycemic requiring titration down on basal dose insulin after changing tube feeds to nocturnal  Interim History / Subjective:  Co. ox still suggesting inadequate endorgan perfusion  Objective:  Blood pressure 106/72, pulse 84, temperature (Abnormal) 96.8 F (36 C), temperature source Core, resp. rate 16, height 4' 11.5" (1.511 m), weight 70.8 kg, SpO2 93 %. PAP: (44-58)/(19-38) 44/38 CVP:  [8 mmHg-23 mmHg] 23 mmHg PCWP:  [11 mmHg-19 mmHg] 17 mmHg CO:  [3.6 L/min-5.4 L/min] 4.2 L/min CI:  [2.2 L/min/m2-3.3 L/min/m2] 2.6 L/min/m2      Intake/Output Summary (Last 24 hours) at 01/27/2022 1018 Last data filed at 01/27/2022 0900 Gross per 24 hour  Intake 3080.46 ml  Output 3281 ml  Net -200.54 ml   Filed Weights   01/24/22 0500 02/06/2022 0230 01/26/22 0408  Weight: 72 kg 71.2 kg 70.8 kg   Physical Examination:  General: 66 year old female remains critically ill on multiple pressors HEENT normocephalic atraumatic no jugular venous distention Pulmonary diminished bases no accessory use currently on room air Cardiac regular rate and rhythm Abdomen soft not tender Extremities warm dry Neuro awake and oriented x3 but intermittently confused GU an uric  Resolved problem list    Thrombocytopenia  Acute respiratory failure Assessment & Plan:  Principal Problem:   Chest pain, Multivessel CAD Active Problems:   Coronary artery disease involving native coronary artery of native heart with unstable angina pectoris ([place])   Pulmonary hypertension (HCC)   Hx of CABG   S/P TVR (tricuspid valve repair)   Hyperlipidemia associated with type 2 diabetes mellitus (HCC)   Anemia of chronic renal failure   Type 2 diabetes mellitus (HCC)   Obesity (BMI 30-39.9)   Chronic combined systolic and diastolic heart failure (HCC)   Atrial fibrillation (HCC)   End stage renal disease on dialysis (HCC)   History of stroke   QT  prolongation   SVT (supraventricular tachycardia) (HCC)   Hyponatremia   GERD (gastroesophageal reflux disease)   Septic shock (HCC)   Gram-negative bacteremia   Cardiogenic shock (HCC)   Tachycardia   S/P CABG (coronary artery bypass graft)   Metabolic encephalopathy   Acute hypoxemic respiratory failure (HCC)   Pressure injury of skin  CAD with TR, s/p CABG x 3 and tricuspid repair 2/10 Acute-on-chronic HFrEF, acute right heart failure> w/ 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  I do not believe she is still septic  Plan  Continue supportive care Monitoring off antibiotics Hemodynamics as above  Afib, persistent; RVR resolved.  Back in sinus rhythm today. Afib with RVR noted 2/21PM with rates into 150s. Plan Continuing IV amiodarone Continuing bivalirudin Telemetry monitoring Ensure potassium greater than 4 magnesium greater than 2   ESRD on HD Thrombosed AVF Plan CRRT per nephrology Considering at some point tunneled dialysis however I am not sure we will get there   Acute metabolic encephalopathy- likely ICU delirium vs less likely sepsis.  Plan Delirium precautions Supportive care  DM2 w/ hyperglycemia, uncontrolled Plan Decrease basal to 5 units bid  Continuing current tube feeding and sliding scale coverage   Anemia of critical illness and renal failure, losing blood frequently in CRRT filters Plan Continue to monitor Epo Trigger for transfusion < 7   Dysphagia At risk for malnutrition -Appreciate SLP's management;  Plan   Continue tube feeds at HS  Dysphagia 3 diet with nectar thick fluids     Best Practice: (right click and "Reselect all SmartList Selections" daily)   Diet/type: tubefeeds  , dysphagia 3 DVT prophylaxis: other- bivalirudin GI prophylaxis: PPI Lines: Central line, Dialysis Catheter, Arterial Line, and yes and it is still needed  Foley:  N/A Code Status:  full code Last date of multidisciplinary goals of care discussion [Family updated 3/1 at bedside]   My critical care time is 31 minutes  Erick Colace ACNP-BC [place] Pager # [phone] OR # [phone] if no answer

## 2022-01-27 NOTE — Progress Notes (Signed)
Nutrition Follow-up ? ?DOCUMENTATION CODES:  ? ?Not applicable ? ?INTERVENTION:  ? ?Encourage po intake ? ?Tube Feeding via Cortrak: Nocturnal TF ?Vital 1.5 at 75 ml/hr x 12 hours ?Provides 1350 kcals, 61 g of protein, 684 mL of free water ? ?Checking Vitamin B1, B6, B12, folate, copper zinc and Vitamin C given prolonged CRRT ? ? ?NUTRITION DIAGNOSIS:  ? ?Inadequate oral intake related to inability to eat as evidenced by NPO status. ? ?Being addressed via TF  ? ?GOAL:  ? ?Patient will meet greater than or equal to 90% of their needs ? ?Progressing ? ?MONITOR:  ? ?Diet advancement, Labs, Weight trends, TF tolerance, Skin, I & O's ? ?REASON FOR ASSESSMENT:  ? ?Consult ?Enteral/tube feeding initiation and management ? ?ASSESSMENT:  ? ?66 y.o. female with history of HTN, DM type 2, HKD, pulmonary HTN, ESRD on HD, CHF, hx CVA, and vitamin D deficiency, presented to ED with radiating chest pain, SOB, and nausea. ? ?Remains on CRRT; pt remains on levophed, vasopressin and epinephrine ? ?RD changed to nocturnal TF yesterday after discussion with MD. Vital 1.5 at 75 ml/hr x 12 hours ? ?Diet advanced back to Dysphagia 3, Nectar Thick post Breakfast yesterday. Pt ate 25% at lunch and dinner last night.  ? ?Per RN today, appetite remains poor but trying to eat at meal times.  ? ?Giving pt has been on CRRT for prolonged period, recommend checking appropriate vitamin labs ? ?Labs: reviewed ?Meds: aranesp, hectoral, ss novolog, levemir, novolog q 4 hours, Rena-Vite ? ? ?Diet Order:   ?Diet Order   ? ?       ?  DIET DYS 3 Room service appropriate? Yes; Fluid consistency: Nectar Thick  Diet effective now       ?  ? ?  ?  ? ?  ? ? ?EDUCATION NEEDS:  ? ?Not appropriate for education at this time ? ?Skin:  Skin Assessment: Skin Integrity Issues: ?Skin Integrity Issues:: Stage II ?Stage II: rectum ?Incisions: closed chest, rt leg ? ?Last BM:  3/1 ? ?Height:  ? ?Ht Readings from Last 1 Encounters:  ?01/01/22 4' 11.5" (1.511 m)   ? ? ?Weight:  ? ?Wt Readings from Last 1 Encounters:  ?01/26/22 70.8 kg  ? ? ?Ideal Body Weight:  45.5 kg ? ?BMI:  Body mass index is 31 kg/m?. ? ?Estimated Nutritional Needs:  ? ?Kcal:  1800-2000 kcals ? ?Protein:  90-105 grams ? ?Fluid:  1L+UOP ? ?Kerman Passey MS, RDN, LDN, CNSC ?Registered Dietitian III ?Clinical Nutrition ?RD Pager and On-Call Pager Number Located in [place]  ? ?

## 2022-01-27 NOTE — Progress Notes (Addendum)
[facility] DAILY ICU PROGRESS NOTE ? ?                 [address] ?           [place] [address] ?         [phone]  ? ?2 Days Post-Op ?Procedure(s) (LRB): ?RIGHT HEART CATH (N/A) ? ?Total Length of Stay:  LOS: 26 days  ? ?Subjective: ?Patient on CRRT and just waking up this am. ? ?Objective: ?Vital signs in last 24 hours: ?Temp:  [97.3 ?F (36.3 ?C)-99 ?F (37.2 ?C)] 97.3 ?F (36.3 ?C) (03/03 0600) ?Pulse Rate:  [83-95] 83 (03/03 0600) ?Cardiac Rhythm: Normal sinus rhythm (03/02 2203) ?Resp:  [14-30] 16 (03/03 0600) ?BP: (81-119)/(52-86) 119/79 (03/03 0600) ?SpO2:  [92 %-100 %] 100 % (03/03 0600) ?Arterial Line BP: (94-137)/(38-68) 137/68 (03/03 0600) ? ?Filed Weights  ? 01/24/22 0500 02/03/2022 0230 01/26/22 0408  ?Weight: 72 kg 71.2 kg 70.8 kg  ? ? ?Weight change:   ? ?Hemodynamic parameters for last 24 hours: ?PAP: (39-58)/(19-33) 55/28 ?CVP:  [8 mmHg-20 mmHg] 19 mmHg ?PCWP:  [11 mmHg-16 mmHg] 11 mmHg ?CO:  [4.6 L/min-5.4 L/min] 4.6 L/min ?CI:  [2.7 L/min/m2-3.3 L/min/m2] 2.7 L/min/m2 ? ?Intake/Output from previous day: ?03/02 0701 - 03/03 0700 ?In: 3316.2 [P.O.:180; I.V.:1517.4; NG/GT:1618.8] ?Out: 3300 [Stool:200] ? ?Intake/Output this shift: ?Total I/O ?In: 1855 [P.O.:60; I.V.:723.8; NG/GT:1071.3] ?Out: 2029 [Other:1829; Stool:200] ? ?Current Meds: ?Scheduled Meds: ? aspirin EC  325 mg Oral Daily  ? Or  ? aspirin  324 mg Per Tube Daily  ? bisacodyl  10 mg Oral Daily  ? Or  ? bisacodyl  10 mg Rectal Daily  ? chlorhexidine gluconate (MEDLINE KIT)  15 mL Mouth Rinse BID  ? Chlorhexidine Gluconate Cloth  6 each Topical Daily  ? darbepoetin (ARANESP) injection - NON-DIALYSIS  100 mcg Subcutaneous Q Sat-1800  ? docusate  200 mg Per Tube Daily  ? doxercalciferol  9 mcg Intravenous Q M,W,F-HD  ? ezetimibe  10 mg Oral Daily  ? feeding supplement (PROSource TF)  45 mL Per Tube BID  ? feeding supplement (VITAL 1.5 CAL)  900 mL Per Tube Q24H  ? insulin aspart  0-20 Units Subcutaneous Q4H  ? insulin aspart  8  Units Subcutaneous Q4H  ? insulin detemir  20 Units Subcutaneous BID  ? latanoprost  1 drop Both Eyes QHS  ? midodrine  15 mg Oral TID  ? multivitamin  1 tablet Per Tube QHS  ? pantoprazole sodium  40 mg Per Tube Daily  ? rosuvastatin  10 mg Per Tube QHS  ? sodium chloride flush  10-40 mL Intracatheter Q12H  ? sodium chloride flush  3 mL Intravenous Q12H  ? Thrombi-Pad  1 each Topical Once  ? ?Continuous Infusions: ?  prismasol BGK 4/2.5 500 mL/hr at 01/27/22 3474  ?  prismasol BGK 4/2.5 500 mL/hr at 01/27/22 0240  ? sodium chloride    ? sodium chloride 10 mL/hr at 01/27/22 0600  ? sodium chloride Stopped (01/26/22 0735)  ? sodium chloride 10 mL/hr at 01/27/22 0610  ? amiodarone 30 mg/hr (01/27/22 0610)  ? epinephrine 1 mcg/min (01/27/22 0600)  ? heparin 1,100 Units/hr (01/27/22 0600)  ? norepinephrine (LEVOPHED) Adult infusion 2 mcg/min (01/27/22 0600)  ? prismasol BGK 4/2.5 1,400 mL/hr at 01/27/22 0606  ? vasopressin 0.04 Units/min (01/27/22 0657)  ? ?PRN Meds:.sodium chloride, sodium chloride, alteplase, food thickener, Gerhardt's butt cream, heparin, hydrOXYzine, ondansetron (ZOFRAN) IV, oxyCODONE, sodium chloride,  sodium chloride flush, sodium chloride flush, traMADol ? ?Neurologic: intact ?Heart:RRR ?Lungs: Mostly clear  ?Abdomen: Soft, obese, non tender, bowel sounds ?Extremities: mild bilateral LE edema. Right forearm ++ edema (AVF) ?Wound: Sternal and RLE wounds clean, dry, well healed ? ?Lab Results: ?CBC: ?Recent Labs  ?  01/26/22 ?0322 01/27/22 ?0310  ?WBC 12.6* 13.3*  ?HGB 7.8* 7.7*  ?HCT 25.8* 24.9*  ?PLT 156 141*  ? ? ?BMET:  ?Recent Labs  ?  01/26/22 ?1542 01/27/22 ?0310  ?NA 138 135  ?K 4.2 4.2  ?CL 103 102  ?CO2 27 25  ?GLUCOSE 80 72  ?BUN 13 12  ?CREATININE 1.21* 1.12*  ?CALCIUM 9.5 9.5  ? ?  ?CMET: ?Lab Results  ?Component Value Date  ? WBC 13.3 (H) 01/27/2022  ? HGB 7.7 (L) 01/27/2022  ? HCT 24.9 (L) 01/27/2022  ? PLT 141 (L) 01/27/2022  ? GLUCOSE 72 01/27/2022  ? CHOL 227 (H) 12/08/2021  ? TRIG  103 12/08/2021  ? HDL 50 12/08/2021  ? LDLCALC 155 (H) 12/08/2021  ? ALT 78 (H) 01/23/2022  ? AST 99 (H) 01/23/2022  ? NA 135 01/27/2022  ? K 4.2 01/27/2022  ? CL 102 01/27/2022  ? CREATININE 1.12 (H) 01/27/2022  ? BUN 12 01/27/2022  ? CO2 25 01/27/2022  ? TSH 2.67 12/08/2021  ? INR 1.5 (H) 01/11/2022  ? HGBA1C 5.6 01/05/2022  ? MICROALBUR 53.5 (H) 03/29/2015  ? ? ? ? ?PT/INR: No results for input(s): LABPROT, INR in the last 72 hours. ?Radiology: No results found. ? ? ?Assessment/Plan: ?S/P Procedure(s) (LRB): ?RIGHT HEART CATH (N/A) ?CV-Previous a fib. SR this am. PAH with cor pulmonale. On Midodrine 15 mg tid. Milrinone stopped a few days ago. Initial co ox this am 49.1, re check 43.6. Final re check result is pending. On Amiodarone and Vasopressin 0.04 drips.  ?Pulmonary-on room air. Pulmonary/CCM following. Encourage incentive spirometer ?GI-Per speech pathology, dysphagia 3 (mechanical soft), nectar thick liquid. ?5. History of PE-on Heparin drip ?6. Anemia of chronic disease-H and H this am decreased to 7.7 and 24.9. On Aranesp.  ?7. History of ESRD-Creatinine this am 1.12 Nephrology following and arranging for CRRT. Has left femoral catheter in place as has infiltrated rit AVF ?8. History of DM-CBGs 179/134/74. On Insulin. Pre op HGA1C 6.1.  ?9. Mild thrombocytopenia-platelets this am 141,000 ? ?Nani Skillern PA-C ?01/27/2022 6:59 AM  ? ?Patient seen and examined, agree with above ?Off milrinone, epi and norepi, BP ok but co-ox low this AM- will discuss with Cards and CCM ? ?Revonda Standard Roxan Hockey, MD ?[facility] ?[phone] ? ? ? ?

## 2022-01-27 NOTE — Progress Notes (Addendum)
Advanced Heart Failure Rounding Note  PCP-Cardiologist: [name], MD   Subjective:    3/1: RHC with swan placement (on NE 2, EPi 3, Milrinone 0.25 and VP 0.02) w/ surprisingly well-compensated hemodynamics (see data below). Milrinone discontinued given low SVR  She was titrated off EPI and NE overnight, but  EPI restarted again this morning given low co-ox in 40s (49>>44>>41%)  She is currently on Epi 4 and VP 0.04. Remains off NE and milrinone. MAPs stable 75. Repeat Co-ox back on EPI pending   Swan #s (off Epi)  CVP 17 PAP 50/26   PCW 19  CO 3.59 CI 2.16 Co-ox 41% (off Epi). Repeat Co-ox pending   PAPi 1.41 SVR 1292 CPO 0.59  Continues on CVVHD, keeping even. PCWP 19. CVP 17    Resting in bed. Notes slight dyspnea. No other complaints.    RHC 3/1 (on 4 vasopressors/inotropes)   Ao = 104/48 (63) RA = 11 RV = 35/10 PA = 53/26 (34) PCW = 17  Fick cardiac output/index = 6.4/3.8 Thermal CO/CI = 5.2/3.1 PVR (Fick) = 2.7 PVR (Therm) = 3.3 SVR (Fick) = 650 SVR (Therm) = 807 Ao sat = 92% PA sat = 58%, 57%  Objective:   Weight Range: 70.8 kg Body mass index is 31 kg/m.   Vital Signs:   Temp:  [96.8 F (36 C)-99 F (37.2 C)] 96.8 F (36 C) (03/03 0800) Pulse Rate:  [82-95] 84 (03/03 0800) Resp:  [14-32] 16 (03/03 0800) BP: (81-119)/(52-86) 106/72 (03/03 0800) SpO2:  [92 %-100 %] 93 % (03/03 0800) Arterial Line BP: (104-137)/(39-68) 129/62 (03/03 0800) Last BM Date : 01/26/22  Weight change: Filed Weights   01/24/22 0500 02/16/2022 0230 01/26/22 0408  Weight: 72 kg 71.2 kg 70.8 kg    Intake/Output:   Intake/Output Summary (Last 24 hours) at 01/27/2022 0831 Last data filed at 01/27/2022 0800 Gross per 24 hour  Intake 3314.95 ml  Output 3283 ml  Net 31.95 ml      Physical Exam    CVP 17  General:  fatigued appearing, obese female, NAD  HEENT: normal + cortrak  Neck: supple. Thick neck JVD not well visualized. Carotids 2+ bilat; no bruits.  No lymphadenopathy or thyromegaly appreciated. Cor: PMI nondisplaced. Regular rate & rhythm. No rubs, gallops or murmurs. Sternotomy site ok  Lungs: CTAB  Abdomen: soft, nontender, nondistended. No hepatosplenomegaly. No bruits or masses. Good bowel sounds. Extremities: no cyanosis, clubbing, rash, trace b/l LE edema, legs warm + rt fem artery HD cath, LFV TLC  Neuro: alert & oriented x 3, cranial nerves grossly intact. moves all 4 extremities w/o difficulty. Affect pleasant.   Telemetry   NSR 80s  personally checked.   EKG    N/A   Labs    CBC Recent Labs    01/26/22 0322 01/27/22 0310  WBC 12.6* 13.3*  NEUTROABS 9.9* 9.8*  HGB 7.8* 7.7*  HCT 25.8* 24.9*  MCV 100.0 99.2  PLT 156 356*   Basic Metabolic Panel Recent Labs    01/26/22 0322 01/26/22 1542 01/27/22 0310  NA 134* 138 135  K 4.6 4.2 4.2  CL 100 103 102  CO2 $Re'24 27 25  'zsb$ GLUCOSE 266* 80 72  BUN $Re'13 13 12  'dic$ CREATININE 1.24* 1.21* 1.12*  CALCIUM 9.4 9.5 9.5  MG 2.5*  --  2.4  PHOS 3.0 2.5 2.0*   Liver Function Tests Recent Labs    01/26/22 1542 01/27/22 0310  ALBUMIN 2.5* 2.6*  No results for input(s): LIPASE, AMYLASE in the last 72 hours. Cardiac Enzymes No results for input(s): CKTOTAL, CKMB, CKMBINDEX, TROPONINI in the last 72 hours.  BNP: BNP (last 3 results) Recent Labs    12/20/21 2246 01/01/22 0941  BNP 1,753.1* 1,432.4*    ProBNP (last 3 results) No results for input(s): PROBNP in the last 8760 hours.   D-Dimer No results for input(s): DDIMER in the last 72 hours. Hemoglobin A1C No results for input(s): HGBA1C in the last 72 hours. Fasting Lipid Panel No results for input(s): CHOL, HDL, LDLCALC, TRIG, CHOLHDL, LDLDIRECT in the last 72 hours. Thyroid Function Tests No results for input(s): TSH, T4TOTAL, T3FREE, THYROIDAB in the last 72 hours.  Invalid input(s): FREET3  Other results:   Imaging    No results found.   Medications:     Scheduled Medications:  aspirin  EC  325 mg Oral Daily   Or   aspirin  324 mg Per Tube Daily   bisacodyl  10 mg Oral Daily   Or   bisacodyl  10 mg Rectal Daily   chlorhexidine gluconate (MEDLINE KIT)  15 mL Mouth Rinse BID   Chlorhexidine Gluconate Cloth  6 each Topical Daily   darbepoetin (ARANESP) injection - NON-DIALYSIS  100 mcg Subcutaneous Q Sat-1800   docusate  200 mg Per Tube Daily   doxercalciferol  9 mcg Intravenous Q M,W,F-HD   ezetimibe  10 mg Oral Daily   feeding supplement (PROSource TF)  45 mL Per Tube BID   feeding supplement (VITAL 1.5 CAL)  900 mL Per Tube Q24H   insulin aspart  0-20 Units Subcutaneous Q4H   insulin aspart  8 Units Subcutaneous Q4H   insulin detemir  20 Units Subcutaneous BID   latanoprost  1 drop Both Eyes QHS   midodrine  15 mg Oral TID   multivitamin  1 tablet Per Tube QHS   pantoprazole sodium  40 mg Per Tube Daily   rosuvastatin  10 mg Per Tube QHS   sodium chloride flush  10-40 mL Intracatheter Q12H   sodium chloride flush  3 mL Intravenous Q12H   Thrombi-Pad  1 each Topical Once    Infusions:   prismasol BGK 4/2.5 500 mL/hr at 01/27/22 0237    prismasol BGK 4/2.5 500 mL/hr at 01/27/22 0240   sodium chloride     sodium chloride 10 mL/hr at 01/27/22 0800   sodium chloride Stopped (01/26/22 0735)   sodium chloride 10 mL/hr at 01/27/22 0800   amiodarone 30 mg/hr (01/27/22 0800)   epinephrine 4 mcg/min (01/27/22 0800)   heparin 1,100 Units/hr (01/27/22 0800)   norepinephrine (LEVOPHED) Adult infusion Stopped (01/27/22 0603)   prismasol BGK 4/2.5 1,400 mL/hr at 01/27/22 0606   vasopressin 0.04 Units/min (01/27/22 0800)    PRN Medications: sodium chloride, sodium chloride, alteplase, food thickener, Gerhardt's butt cream, heparin, hydrOXYzine, ondansetron (ZOFRAN) IV, oxyCODONE, sodium chloride, sodium chloride flush, sodium chloride flush, traMADol    Patient Profile   66 y/o female with obesity, T2DM, previous PE, ESRD and mitral regurgitation, s/p MV repair 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 Subsequently followed at [facility] for mixed [place] on macitentan and sildenafil. Admitted 2/5 for CP. LHC showed severe 3v CAD with CTO of RCA & LCX and critical ostial LAD lesion. Initial echo, EF 40-45%, no MR, mod MS, RV mildly reduced w/ severe TR. Underwent CABG w/ PFO closure and tricuspid valve repair/valvuloplasty w/ ring 01/17/2022. Complicated post CABG course with RV failure, GN  bacteremia and persistent hypotension and inability to wean off pressors. Nephrology following for CVVH.  Assessment/Plan   1. Post-cardiotomy cardiogenic shock - likely due predominantly to end-stage RV failure. (Required ECMO in 2016 post MVR) - Post op platelets down to 30. Switched to Bival. HIT negative. Plts improved  - RHC 3/1 revealed surprisingly well-compensated hemodynamics on 4 vasopressors/inotropes. CO/CI 6.4/3.8, RA 11, PCW 17, PAPi 2.45  - Milrinone stopped 3/1 w/ low SVR (650)  - Weaned off EPI and NE overnight, w/ subsequent drop in CO. CI 2.16, Co-ox 40s, PAPi lower 1.4, CPO 0.6. SVR 1292. EPi added back, now currently on Epi 4 and VP 0.04. - Re-shoot swan numbers and repeat co-ox. If output still low/marginal and SVR high, may need to add back low dose milrinone. Can also wean VP - Continue CVVHD. PCWP 19. CVP 17. Try to pull today for net negative fluid balance   - Continue midodrine    2. PAH with cor-pulmonale, end-stage - she clearly has mixed PH (predominantly WHO Groups II-III but perhaps contributions from I & IV) - Hold macitentan. Given her hemodynamics  doubt she will have much to gain from further selective pulmonary artery vasodilators but we can use PA cath to help figure that out - Continue epi. Milrinone discontinued 3/1 w/ low SVR but may need to add back (see above)    3. Severe 3vCAD s/p CABG x3 with closure PFO + TV ring on 12/31/2021 - no current s/s angina - continue ASA/Statin   4. ESRD - currently on CVVHD - appreciate nephrology's  assistance    5. SVT/possible PAF - continue IV amio while on inotropes/pressors - plts recovered. HIT negative.  - Switch from Bival back to heparin 3/2   6. H/O PE - 11/2020 VQ : unchanged left lower lobe apical segment PE - VQ 01/02/22 low prob - Continue heparin gtt    7. H/O MV Ring 2016 - stable on echo   8. Anemia  - Hgb down, 8.5>>7.8>>7.7  - transfuse for hgb < 7.0  - iron stores normal   9. Bacteremia  - morganella morganii bacteremia - completed abx - BCx 2/18 cleared   Length of Stay: [address], PA-C  01/27/2022, 8:31 AM  Advanced Heart Failure Team Pager [phone] (M-F; 7a - 5p)  Please contact [facility] for night-coverage after hours (5p -7a ) and weekends on [email or web address]  Agree with above.   Back on EPI and VP this am. CVP back up. Co-ox and thermo outputs dropping. SVR up to 1292.   Denies CP or SOB   General:  Weak appearing. No resp difficulty HEENT: normal Neck: supple. JVP hard to see  Cor: Regular rate & rhythm. No rubs, gallops or murmurs. Lungs: coarse Abdomen: obese soft, nontender, nondistended. No hepatosplenomegaly. No bruits or masses. Good bowel sounds. Extremities: no cyanosis, clubbing, rash, tr edema RFV HD cath [facility] Neuro: alert & orientedx3, cranial nerves grossly intact. moves all 4 extremities w/o difficulty. Affect pleasant  She has severe RV failure which was well compensated with multiple pressors. Now worse with wean. SVR has been quite low but now trending up. Continue EPI for now. Wean VP. May need to add back low-dose milrinone. Shoot for net negative on CVVHD today (-50 to -100) Goal CVP 10-12 May need to add back low-dose milrinone.   CRITICAL CARE Performed by: Glori Bickers  Total critical care time: 35 minutes  Critical care time was exclusive of separately billable procedures  and treating other patients.  Critical care was necessary to treat or prevent imminent or life-threatening  deterioration.  Critical care was time spent personally by me (independent of midlevel providers or residents) on the following activities: development of treatment plan with patient and/or surrogate as well as nursing, discussions with consultants, evaluation of patient's response to treatment, examination of patient, obtaining history from patient or surrogate, ordering and performing treatments and interventions, ordering and review of laboratory studies, ordering and review of radiographic studies, pulse oximetry and re-evaluation of patient's condition.  Glori Bickers, MD  9:22 AM

## 2022-01-28 ENCOUNTER — Inpatient Hospital Stay (HOSPITAL_COMMUNITY): Payer: Medicare Other

## 2022-01-28 DIAGNOSIS — J9601 Acute respiratory failure with hypoxia: Secondary | ICD-10-CM | POA: Diagnosis not present

## 2022-01-28 DIAGNOSIS — R570 Cardiogenic shock: Secondary | ICD-10-CM | POA: Diagnosis not present

## 2022-01-28 LAB — CBC WITH DIFFERENTIAL/PLATELET
Abs Immature Granulocytes: 0.1 10*3/uL — ABNORMAL HIGH (ref 0.00–0.07)
Basophils Absolute: 0.1 10*3/uL (ref 0.0–0.1)
Basophils Relative: 1 %
Eosinophils Absolute: 0.8 10*3/uL — ABNORMAL HIGH (ref 0.0–0.5)
Eosinophils Relative: 4 %
HCT: 26.6 % — ABNORMAL LOW (ref 36.0–46.0)
Hemoglobin: 8.1 g/dL — ABNORMAL LOW (ref 12.0–15.0)
Immature Granulocytes: 1 %
Lymphocytes Relative: 5 %
Lymphs Abs: 0.8 10*3/uL (ref 0.7–4.0)
MCH: 31 pg (ref 26.0–34.0)
MCHC: 30.5 g/dL (ref 30.0–36.0)
MCV: 101.9 fL — ABNORMAL HIGH (ref 80.0–100.0)
Monocytes Absolute: 1 10*3/uL (ref 0.1–1.0)
Monocytes Relative: 6 %
Neutro Abs: 15 10*3/uL — ABNORMAL HIGH (ref 1.7–7.7)
Neutrophils Relative %: 83 %
Platelets: 152 10*3/uL (ref 150–400)
RBC: 2.61 MIL/uL — ABNORMAL LOW (ref 3.87–5.11)
RDW: 25.2 % — ABNORMAL HIGH (ref 11.5–15.5)
WBC: 17.8 10*3/uL — ABNORMAL HIGH (ref 4.0–10.5)
nRBC: 1.9 % — ABNORMAL HIGH (ref 0.0–0.2)

## 2022-01-28 LAB — RENAL FUNCTION PANEL
Albumin: 2.6 g/dL — ABNORMAL LOW (ref 3.5–5.0)
Albumin: 2.5 g/dL — ABNORMAL LOW (ref 3.5–5.0)
Anion gap: 13 (ref 5–15)
Anion gap: 9 (ref 5–15)
BUN: 10 mg/dL (ref 8–23)
BUN: 10 mg/dL (ref 8–23)
CO2: 21 mmol/L — ABNORMAL LOW (ref 22–32)
CO2: 25 mmol/L (ref 22–32)
Calcium: 9.5 mg/dL (ref 8.9–10.3)
Calcium: 9.8 mg/dL (ref 8.9–10.3)
Chloride: 100 mmol/L (ref 98–111)
Chloride: 103 mmol/L (ref 98–111)
Creatinine, Ser: 1.06 mg/dL — ABNORMAL HIGH (ref 0.44–1.00)
Creatinine, Ser: 1.01 mg/dL — ABNORMAL HIGH (ref 0.44–1.00)
GFR, Estimated: 58 mL/min — ABNORMAL LOW (ref >60)
GFR, Estimated: >60 mL/min (ref >60)
Glucose, Bld: 200 mg/dL — ABNORMAL HIGH (ref 70–99)
Glucose, Bld: 130 mg/dL — ABNORMAL HIGH (ref 70–99)
Phosphorus: 2.5 mg/dL (ref 2.5–4.6)
Phosphorus: 2.1 mg/dL — ABNORMAL LOW (ref 2.5–4.6)
Potassium: 4.8 mmol/L (ref 3.5–5.1)
Potassium: 4.3 mmol/L (ref 3.5–5.1)
Sodium: 134 mmol/L — ABNORMAL LOW (ref 135–145)
Sodium: 137 mmol/L (ref 135–145)

## 2022-01-28 LAB — COOXEMETRY PANEL
Carboxyhemoglobin: 2 % — ABNORMAL HIGH (ref 0.5–1.5)
Methemoglobin: <0.7 % (ref 0.0–1.5)
O2 Saturation: 43.7 %
Total hemoglobin: 9 g/dL — ABNORMAL LOW (ref 12.0–16.0)

## 2022-01-28 LAB — GLUCOSE, CAPILLARY
Glucose-Capillary: 132 mg/dL — ABNORMAL HIGH (ref 70–99)
Glucose-Capillary: 142 mg/dL — ABNORMAL HIGH (ref 70–99)
Glucose-Capillary: 184 mg/dL — ABNORMAL HIGH (ref 70–99)
Glucose-Capillary: 197 mg/dL — ABNORMAL HIGH (ref 70–99)
Glucose-Capillary: 88 mg/dL (ref 70–99)

## 2022-01-28 LAB — HEPARIN LEVEL (UNFRACTIONATED): Heparin Unfractionated: 0.55 IU/mL (ref 0.30–0.70)

## 2022-01-28 LAB — MAGNESIUM: Magnesium: 2.4 mg/dL (ref 1.7–2.4)

## 2022-01-28 MED ORDER — INSULIN DETEMIR 100 UNIT/ML ~~LOC~~ SOLN
10.0000 [IU] | Freq: Two times a day (BID) | SUBCUTANEOUS | Status: DC
  Administered 2022-01-28 (×2): 10 [IU] via SUBCUTANEOUS
  Filled 2022-01-28 (×4): qty 0.1

## 2022-01-28 MED ORDER — MIDODRINE HCL 5 MG PO TABS
15.0000 mg | ORAL_TABLET | Freq: Four times a day (QID) | ORAL | Status: DC
  Administered 2022-01-28 – 2022-02-04 (×29): 15 mg via ORAL
  Filled 2022-01-28 (×28): qty 3

## 2022-01-28 NOTE — Progress Notes (Signed)
3 Days Post-Op Procedure(s) (LRB): ?RIGHT HEART CATH (N/A) ?Subjective: ? ?Has been relatively stable on 6 epi and midodrine 15 every 6 per tube. Vaso weaned off. ? ?CI 2.6. CVP 7, PA 40/17    Co-ox 44 this am. ? ?Remains on CRRT to keep even. ? ?Objective: ?Vital signs in last 24 hours: ?Temp:  [97.2 ?F (36.2 ?C)-99 ?F (37.2 ?C)] 98.4 ?F (36.9 ?C) (03/04 1530) ?Pulse Rate:  [78-90] 89 (03/04 1515) ?Cardiac Rhythm: Normal sinus rhythm (03/04 1200) ?Resp:  [12-30] 25 (03/04 1530) ?BP: (73-181)/(50-164) 83/51 (03/04 1500) ?SpO2:  [92 %-100 %] 100 % (03/04 1515) ?Arterial Line BP: (90-150)/(35-76) 102/40 (03/04 1530) ?Weight:  [71 kg] 71 kg (03/04 0345) ? ?Hemodynamic parameters for last 24 hours: ?PAP: (39-59)/(17-33) 40/17 ?CVP:  [4 mmHg-18 mmHg] 4 mmHg ?PCWP:  [21 mmHg-22 mmHg] 22 mmHg ?CO:  [4.3 L/min-5.9 L/min] 4.3 L/min ?CI:  [2.6 L/min/m2-3.6 L/min/m2] 2.6 L/min/m2 ? ?Intake/Output from previous day: ?03/03 0701 - 03/04 0700 ?In: 2619.6 [P.O.:60; I.V.:1499.6; NG/GT:1020] ?Out: 3243 [Emesis/NG output:100; HTDSK:876] ?Intake/Output this shift: ?Total I/O ?In: 587.8 [I.V.:477.8; NG/GT:110] ?Out: 755 [Other:755] ? ?General appearance: slowed mentation ?Neurologic: intact ?Heart: regular rate and rhythm, S1, S2 normal, 2/6 systolic murmur ?Lungs: clear to auscultation bilaterally ?Abdomen: soft, non-tender; bowel sounds normal ?Extremities: edema mild ?Wound: incision healing ? ?Lab Results: ?Recent Labs  ?  01/27/22 ?0310 01/28/22 ?0335  ?WBC 13.3* 17.8*  ?HGB 7.7* 8.1*  ?HCT 24.9* 26.6*  ?PLT 141* 152  ? ?BMET:  ?Recent Labs  ?  01/27/22 ?1619 01/28/22 ?0335  ?NA 137 134*  ?K 4.5 4.8  ?CL 102 100  ?CO2 27 21*  ?GLUCOSE 101* 200*  ?BUN 9 10  ?CREATININE 1.06* 1.06*  ?CALCIUM 9.5 9.5  ?  ?PT/INR: No results for input(s): LABPROT, INR in the last 72 hours. ?ABG ?   ?Component Value Date/Time  ? PHART 7.425 01/20/2022 1944  ? HCO3 26.2 02/19/2022 0821  ? TCO2 27 02/14/2022 0821  ? ACIDBASEDEF 1.0 01/20/2022 1944  ?  O2SAT 43.7 01/28/2022 0447  ? ?CBG (last 3)  ?Recent Labs  ?  01/27/22 ?2303 01/28/22 ?0000 01/28/22 ?0339  ?GLUCAP 91 88 184*  ? ? ?Assessment/Plan: ? ?Hemodynamics relatively stable on epi 6 and midodrine. CI good. Wean epi as BP allows. She has severe RV failure. Advanced HF team is following. ? ?ESRD on CRRT to keep even with low CVP. ? ?Tube feeds for nutrition. ? LOS: 27 days  ? ? ?Gaye Pollack ?01/28/2022 ? ? ?

## 2022-01-28 NOTE — Progress Notes (Signed)
ANTICOAGULATION CONSULT NOTE ? ?Pharmacy Consult for bivalirudin>>heparin ?Indication: history of pulmonary embolus 2021 ? ?Allergies  ?Allergen Reactions  ? Minoxidil Other (See Comments)  ?  Pericardial effusion  ? Lipitor [Atorvastatin] Other (See Comments)  ?  MYALGIAS > "pain in legs" ?Tolerates rosuvastatin  ? Morphine And Related Itching  ? Ace Inhibitors Other (See Comments)  ?  REACTION: "not sure...think it made me drowsy all the time"  ? ? ?Patient Measurements: ?Height: 4' 11.5" (151.1 cm) ?Weight: 71 kg (156 lb 8.4 oz) ?IBW/kg (Calculated) : 44.35 ?Heparin DW: 70.8kg  ? ?Vital Signs: ?Temp: 98.6 ?F (37 ?C) (03/04 0830) ?Temp Source: Core (03/04 0800) ?BP: 100/64 (03/04 0800) ?Pulse Rate: 87 (03/04 0830) ? ?Labs: ?Recent Labs  ?  02/13/2022 ?1420 02/16/2022 ?1637 01/26/22 ?0322 01/26/22 ?1542 01/27/22 ?0310 01/27/22 ?2035 01/27/22 ?1619 01/28/22 ?0335  ?HGB  --    < > 7.8*  --  7.7*  --   --  8.1*  ?HCT  --   --  25.8*  --  24.9*  --   --  26.6*  ?PLT  --   --  156  --  141*  --   --  152  ?APTT 54*  --  53*  --   --   --   --   --   ?HEPARINUNFRC  --   --   --    < > 0.30 0.39  --  0.55  ?CREATININE  --    < > 1.24*   < > 1.12*  --  1.06* 1.06*  ? < > = values in this interval not displayed.  ? ? ? ?Estimated Creatinine Clearance: 45.3 mL/min (A) (by C-G formula based on SCr of 1.06 mg/dL (H)). ? ? ?Assessment: ?66 yo female on chronic apixaban for hx of PE 2021. Apixaban has been held for surgery.  Pharmacy asked to resume anticoagulation post op 2/15 with bivalirudin.  HIT antibody and SRA both negative.  Platelet count now recovered 50>208>150-160s.  ? ?Patient on heparin (s/p bivalirudin) as HIT work-up negative. ?Heparin level 0.55 is therapeutic on 1100 units/hr. No bleeding noted Hgb stable 7-8. PLTC recovered  stable 140-150.  ? ?Goal of Therapy:  ?Heparin level 0.3-0.5 - target lower end given low platelet count/risk of post-operative bleeding ?Monitor platelets by anticoagulation protocol: Yes ?   ?Plan:  ?-Decrease Heparin drip to 1000 units/hr ?-HL and CBC daily while on heparin ?-Monitor for s/x of bleeding ? ? ?Bonnita Nasuti Pharm.D. CPP, BCPS ?Clinical Pharmacist ?[phone] ?01/28/2022 9:03 AM  ? ? ?Please check [email or web address] for unit-specific pharmacy phone numbers. ? ? ?

## 2022-01-28 NOTE — Plan of Care (Signed)
Stable during shift, Alert, oriented, flat affect.  ? ?NSR, amio gtt. Labile BP, restarted vaso gtt, epi titrated, now at 6. CRRT continues without complications, approx even over shift, intermittently had to stop pulling d/t hypotension. Swan, CO stable. CVP 13-16.  Coox remains low 43.7 on epi @ 6. Heparin gtt, therapeutic.  ? ?Nocturnal Tube feeds. Diarrhea, flexiseal. Episode of nausea, responded to zofran IV. Episode of anxiety, responded to Atarax per tube.  ? ?Afebrile, WBC up 17.8.  ? ?Problem: Education: ?Goal: Knowledge of General Education information will improve ?Description: Including pain rating scale, medication(s)/side effects and non-pharmacologic comfort measures ?Outcome: Progressing ?  ?Problem: Clinical Measurements: ?Goal: Will remain free from infection ?Outcome: Progressing ?Goal: Respiratory complications will improve ?Outcome: Progressing ?Goal: Cardiovascular complication will be avoided ?Outcome: Progressing ?  ?Problem: Coping: ?Goal: Level of anxiety will decrease ?Outcome: Progressing ?  ?Problem: Pain Managment: ?Goal: General experience of comfort will improve ?Outcome: Progressing ?  ?Problem: Safety: ?Goal: Ability to remain free from injury will improve ?Outcome: Progressing ?  ?

## 2022-01-28 NOTE — Progress Notes (Signed)
?[facility] ?Progress Note  ? ?Assessment/ Plan:   ?OP HD: MWF [facility] ?3:15h   450/A1.5   75.5kg   2K/2Ca bath  P2   AVF  Hep  2500 ?- Hectoral 85mg IV q HD ?- Sensipar 942mPO q HD ?- Mircera 15040mIV q 2 weeks (ordered, not yet given. Last Mircera 100 on 12/07/21) ?  ?  ?  ?Assessment/Plan:  ? CAD s/p CABG x3, TR sp TVR:  per CT surgery  ? ESRD:  normally MWF schedule.  Has been on CRRT with a small holiday which she did not tolerate since 2/11. ?Shock - cardiogenic and septic. Has RV failure- RHC 02/21/2022--> more well-compensated hemodynamics.  Weaning support per AHF.  Co-ox is still in the 40s, on epi and milrinone. ?Morganella bacteremia -  s/p Zosyn ? Anemia CKD: Sq darbe 100ug weekly on Saturday. 1st 2/25. ? Metabolic bone disease: resume home binders and sensipar when taking PO. Cont IV vdra.  ? Infiltrated AVF - AVF infiltrated on 2/7. Post-op AVF is nonfunctional, [facility] aware. [facility].  ?[facility]:  [facility] ? T2DM-per primary service ? Hx of MV replacement - in 2016 ?Dispo: ICU ? ?Subjective:   ? ?Epi up at 6.  Co-ox is still in the 40s.  She was essentially net even yesterday.  Nauseated and threw up at breakfast this AM.    ? ?Objective:   ?BP 100/64   Pulse 87   Temp 98.6 ?F (37 ?C)   Resp (!) 29   Ht 4' 11.5" (1.511 m)   Wt 71 kg   SpO2 97%   BMI 31.09 kg/m?  ? ?Physical Exam: ?GenYOK:HTXHFAD ?CVS: tachy ?Resp: coarse ?Abd: soft ?Ext: 1+ anasarca, worse in R UE where clotted AVF is, L sided fem swan ?ACCESS: nontunneled HD cath R fem ? ?Labs: ?BMET ?Recent Labs  ?Lab 02/16/2022 ?0415 02/14/2022 ?0814142/01/23 ?[id number]/01/23 ?1637 01/26/22 ?0322 01/26/22 ?1542 01/27/22 ?0310 01/27/22 ?1619 01/28/22 ?0335  ?NA 136   < > 135 136 134* 138 135 137 134*  ?K 4.4   < > 4.4 4.6 4.6 4.2 4.2 4.5 4.8  ?CL 102  --   --  101 100 103 102 102 100  ?CO2 24  --   --  _0 21*  ?GLUCOSE 179*  --   --  245* 266* 80 72 101* 200*  ?BUN 13   --   --  _1 ?CREATININE 1.23*  --   --  1.33* 1.24* 1.21* 1.12* 1.06* 1.06*  ?CALCIUM 9.7  --   --  9.4 9.4 9.5 9.5 9.5 9.5  ?PHOS 2.6  --   --  3.0 3.0 2.5 2.0* 2.7 2.5  ? < > = values in this interval not displayed.  ? ?CBC ?Recent Labs  ?Lab 02/15/2022 ?0415 02/17/2022 ?[id number]/01/23 ?0823435/02/23 ?[id number]/03/23 ?0310 01/28/22 ?0335  ?WBC 13.2*  --   --  12.6* 13.3* 17.8*  ?NEUTROABS 10.5*  --   --  9.9* 9.8* 15.0*  ?HGB 7.6*   < > 8.5* 7.8* 7.7* 8.1*  ?HCT 24.0*   < > 25.0* 25.8* 24.9* 26.6*  ?MCV 97.2  --   --  100.0 99.2 101.9*  ?PLT 165  --   --  156 141* 152  ? < > = values in this interval not displayed.  ? ? ?  ?Medications:   ? ?  aspirin EC  325 mg Oral Daily  ? Or  ? aspirin  324 mg Per Tube Daily  ? chlorhexidine gluconate (MEDLINE KIT)  15 mL Mouth Rinse BID  ? Chlorhexidine Gluconate Cloth  6 each Topical Daily  ? darbepoetin (ARANESP) injection - NON-DIALYSIS  100 mcg Subcutaneous Q Sat-1800  ? doxercalciferol  9 mcg Intravenous Q M,W,F-HD  ? ezetimibe  10 mg Oral Daily  ? feeding supplement (PROSource TF)  45 mL Per Tube BID  ? feeding supplement (VITAL 1.5 CAL)  900 mL Per Tube Q24H  ? insulin aspart  0-20 Units Subcutaneous Q4H  ? insulin aspart  8 Units Subcutaneous Q4H  ? insulin detemir  5 Units Subcutaneous BID  ? latanoprost  1 drop Both Eyes QHS  ? midodrine  15 mg Oral TID  ? multivitamin  1 tablet Per Tube QHS  ? pantoprazole sodium  40 mg Per Tube Daily  ? rosuvastatin  10 mg Per Tube QHS  ? sodium chloride flush  10-40 mL Intracatheter Q12H  ? sodium chloride flush  3 mL Intravenous Q12H  ? Thrombi-Pad  1 each Topical Once  ? ? ? ?Madelon Lips, MD ?01/28/2022, 8:32 AM   ?

## 2022-01-28 NOTE — Progress Notes (Signed)
Advanced Heart Failure Rounding Note  PCP-Cardiologist: [name], MD   Subjective:    3/1: RHC with swan placement (on NE 2, EPi 3, Milrinone 0.25 and VP 0.02) w/ surprisingly well-compensated hemodynamics (see data below). Milrinone discontinued given low SVR  This morning, she is on epinephrine 6, vasopressin 0.02.  She remains on CVVH, running UF even.   She is in NSR on amiodarone gtt.   Swan #s today  CVP 12 PA 48/25   CI 2.9 PAPI 1.9 Co-ox 44% PAPi 1.41 SVR 1044  RHC 3/1 (on 4 vasopressors/inotropes)   Ao = 104/48 (63) RA = 11 RV = 35/10 PA = 53/26 (34) PCW = 17  Fick cardiac output/index = 6.4/3.8 Thermal CO/CI = 5.2/3.1 PVR (Fick) = 2.7 PVR (Therm) = 3.3 SVR (Fick) = 650 SVR (Therm) = 807 Ao sat = 92% PA sat = 58%, 57%  Objective:   Weight Range: 71 kg Body mass index is 31.09 kg/m.   Vital Signs:   Temp:  [97.2 F (36.2 C)-99 F (37.2 C)] 99 F (37.2 C) (03/04 1000) Pulse Rate:  [78-90] 85 (03/04 1000) Resp:  [12-30] 26 (03/04 1000) BP: (73-114)/(50-77) 89/53 (03/04 1000) SpO2:  [87 %-100 %] 92 % (03/04 1000) Arterial Line BP: (91-150)/(38-76) 102/42 (03/04 1000) Weight:  [71 kg] 71 kg (03/04 0345) Last BM Date : 01/28/22  Weight change: Filed Weights   02/15/2022 0230 01/26/22 0408 01/28/22 0345  Weight: 71.2 kg 70.8 kg 71 kg    Intake/Output:   Intake/Output Summary (Last 24 hours) at 01/28/2022 1022 Last data filed at 01/28/2022 1000 Gross per 24 hour  Intake 2604.7 ml  Output 3093 ml  Net -488.3 ml      Physical Exam    CVP 12 General: Fatigued-appearing Neck: JVP 10 cm, no thyromegaly or thyroid nodule.  Lungs: Clear to auscultation bilaterally with normal respiratory effort. CV: Nondisplaced PMI.  Heart regular S1/S2, no S3/S4, 3/6 HSM LLSB. Trace ankle edema.  Abdomen: Soft, nontender, no hepatosplenomegaly, no distention.  Skin: Intact without lesions or rashes.  Neurologic: Alert and oriented x 3.  Psych:  Normal affect. Extremities: No clubbing or cyanosis.  HEENT: Normal.    Telemetry   NSR 80s  personally checked.   EKG    N/A   Labs    CBC Recent Labs    01/27/22 0310 01/28/22 0335  WBC 13.3* 17.8*  NEUTROABS 9.8* 15.0*  HGB 7.7* 8.1*  HCT 24.9* 26.6*  MCV 99.2 101.9*  PLT 141* 220   Basic Metabolic Panel Recent Labs    01/27/22 0310 01/27/22 1619 01/28/22 0335  NA 135 137 134*  K 4.2 4.5 4.8  CL 102 102 100  CO2 25 27 21*  GLUCOSE 72 101* 200*  BUN $Re'12 9 10  'Mil$ CREATININE 1.12* 1.06* 1.06*  CALCIUM 9.5 9.5 9.5  MG 2.4  --  2.4  PHOS 2.0* 2.7 2.5   Liver Function Tests Recent Labs    01/27/22 1619 01/28/22 0335  ALBUMIN 2.7* 2.6*   No results for input(s): LIPASE, AMYLASE in the last 72 hours. Cardiac Enzymes No results for input(s): CKTOTAL, CKMB, CKMBINDEX, TROPONINI in the last 72 hours.  BNP: BNP (last 3 results) Recent Labs    12/20/21 2246 01/01/22 0941  BNP 1,753.1* 1,432.4*    ProBNP (last 3 results) No results for input(s): PROBNP in the last 8760 hours.   D-Dimer No results for input(s): DDIMER in the last 72 hours. Hemoglobin A1C No  results for input(s): HGBA1C in the last 72 hours. Fasting Lipid Panel No results for input(s): CHOL, HDL, LDLCALC, TRIG, CHOLHDL, LDLDIRECT in the last 72 hours. Thyroid Function Tests No results for input(s): TSH, T4TOTAL, T3FREE, THYROIDAB in the last 72 hours.  Invalid input(s): FREET3  Other results:   Imaging    No results found.   Medications:     Scheduled Medications:  aspirin EC  325 mg Oral Daily   Or   aspirin  324 mg Per Tube Daily   chlorhexidine gluconate (MEDLINE KIT)  15 mL Mouth Rinse BID   Chlorhexidine Gluconate Cloth  6 each Topical Daily   darbepoetin (ARANESP) injection - NON-DIALYSIS  100 mcg Subcutaneous Q Sat-1800   doxercalciferol  9 mcg Intravenous Q M,W,F-HD   ezetimibe  10 mg Oral Daily   feeding supplement (PROSource TF)  45 mL Per Tube BID    feeding supplement (VITAL 1.5 CAL)  900 mL Per Tube Q24H   insulin aspart  0-20 Units Subcutaneous Q4H   insulin detemir  10 Units Subcutaneous BID   latanoprost  1 drop Both Eyes QHS   midodrine  15 mg Oral Q6H   multivitamin  1 tablet Per Tube QHS   pantoprazole sodium  40 mg Per Tube Daily   rosuvastatin  10 mg Per Tube QHS   sodium chloride flush  10-40 mL Intracatheter Q12H   sodium chloride flush  3 mL Intravenous Q12H   Thrombi-Pad  1 each Topical Once    Infusions:   prismasol BGK 4/2.5 500 mL/hr at 01/28/22 0916    prismasol BGK 4/2.5 500 mL/hr at 01/28/22 0915   sodium chloride     sodium chloride 10 mL/hr at 01/28/22 1000   sodium chloride Stopped (01/26/22 0735)   sodium chloride 10 mL/hr at 01/28/22 1000   amiodarone 30 mg/hr (01/28/22 1000)   epinephrine 6 mcg/min (01/28/22 1000)   heparin 1,100 Units/hr (01/28/22 1000)   norepinephrine (LEVOPHED) Adult infusion Stopped (01/27/22 1433)   prismasol BGK 4/2.5 1,400 mL/hr at 01/28/22 0732   vasopressin 0.02 Units/min (01/28/22 1000)    PRN Medications: sodium chloride, sodium chloride, alteplase, bisacodyl **OR** bisacodyl, docusate, food thickener, Gerhardt's butt cream, heparin, hydrOXYzine, ondansetron (ZOFRAN) IV, oxyCODONE, sodium chloride, sodium chloride flush, sodium chloride flush, traMADol    Patient Profile   66 y/o female with obesity, T2DM, previous PE, ESRD and mitral regurgitation, 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 Subsequently followed at [place] for mixed [place] on macitentan and sildenafil. Admitted 2/5 for CP. LHC showed severe 3v CAD with CTO of RCA & LCX and critical ostial LAD lesion. Initial echo, EF 40-45%, no MR, mod MS, RV mildly reduced w/ severe TR. Underwent CABG w/ PFO closure and tricuspid valve repair/valvuloplasty w/ ring 01/24/2022. Complicated post CABG course with RV failure, GN bacteremia and persistent hypotension and inability to wean off pressors. Nephrology following  for CVVH.  Assessment/Plan   1. Post-cardiotomy cardiogenic shock - likely due predominantly to end-stage RV failure. (Required ECMO in 2016 post MVR). Echo 01/15/22 with EF 50-55%, D-shaped septum with severe RV dysfunction and enlargement, s/p MV repair, s/p TV repair but still with severe TR.  - Post op platelets down to 30. Switched to Bival. HIT negative. Plts improved  - RHC 3/1 revealed surprisingly well-compensated hemodynamics on 4 vasopressors/inotropes. CO/CI 6.4/3.8, RA 11, PCW 17, PAPi 2.45  - Milrinone stopped 3/1 w/ low SVR (650)  - She remains on epinephrine 6 with  vasopressin 0.02. She is on midodrine 15 mg tid.  There is a disconnect between co-ox (44%, consistently low) and cardiac index which has ranged 2.9 to low 3's.   - At this point, I do not think that restarting additional pressors will be helpful.  Will need to try to slowly wean the support she is on, will go by thermo cardiac index for now.  Start weaning vasopressin today, then will be slow wean of epinephrine.  If severe RV failure prevents epinephrine wean, we will not have further options and will need to start thinking about palliative care.  - CVP 12 range today.  With severe RV failure, this is within goal range (10-12).  Today, would use CVVH to keep I/Os even.    2. PAH with cor-pulmonale, end-stage - she clearly has mixed PH (predominantly WHO Groups II-III but perhaps contributions from I & IV) - Hold macitentan. Given her hemodynamics  doubt she will have much to gain from further selective pulmonary artery vasodilators. PVR not elevated.    3. Severe 3vCAD s/p CABG x3 with closure PFO + TV ring on 01/08/2022 - no current s/s angina - continue ASA/Statin   4. ESRD - currently on CVVHD, CVP 12 with severe RV failure.  Trying to keep I/Os even today.    5. SVT/possible PAF - continue IV amio while on inotropes/pressors - plts recovered. HIT negative.  - Switch from Bival back to heparin 3/2   6. H/O  PE - 11/2020 VQ : unchanged left lower lobe apical segment PE - VQ 01/02/22 low prob - Continue heparin gtt    7. H/O MV Ring 2016 - stable on echo   8. Anemia  - Hgb down, 8.5>>7.8>>7.7>>8.1  - transfuse for hgb < 7.0  - iron stores normal   9. Bacteremia  - morganella morganii bacteremia - completed abx - BCx 2/18 cleared   10. Severe TR despite TV repair  CRITICAL CARE Performed by: Loralie Champagne  Total critical care time: 40 minutes  Critical care time was exclusive of separately billable procedures and treating other patients.  Critical care was necessary to treat or prevent imminent or life-threatening deterioration.  Critical care was time spent personally by me (independent of midlevel providers or residents) on the following activities: development of treatment plan with patient and/or surrogate as well as nursing, discussions with consultants, evaluation of patient's response to treatment, examination of patient, obtaining history from patient or surrogate, ordering and performing treatments and interventions, ordering and review of laboratory studies, ordering and review of radiographic studies, pulse oximetry and re-evaluation of patient's condition.  Loralie Champagne, MD  10:22 AM

## 2022-01-28 NOTE — Progress Notes (Signed)
Patient refused BIPAP for tonight. Vitals stable. No BIPAP in room. RT will continue to monitor.  ?

## 2022-01-28 NOTE — Progress Notes (Signed)
? ?NAME:  Jody Nguyen, MRN:  [id number], DOB:  [birth year], LOS: 20 ?ADMISSION DATE:  01/20/2022, CONSULTATION DATE:  01/22/2022 ?REFERRING MD:  Marlowe Sax - TRH, CHIEF COMPLAINT:  SOB  ? ?History of Present Illness:  ?66 year old woman who presented to [facility] ED 2/4 with CP radiating to her arm. PMHx significant for  ESRD (on HD), non-obstructive CAD, suspected pulmonary hypertension, PE (on Eliquis). Recent ED presentation 2/1 for similar symptoms (troponin 100s). F/u with Cardiology as an outpatient 2/2 and CP was felt to be related to volume shift (occurring during dialysis).  ? ?On ED presentation 2/4, patient reported episodic chest pain, non-exertional and intermittent x 1 week with associated nausea and SOB. Non-specific EKG changes. Cardiology consulted, STEMI ruled out. Troponin elevated at 200. Placed on [place] for SpO2 80s. Admitted to [facility] for observation with concern for PE and started on heparin gtt. VQ with low probability for PE. Echo 2/19 demonstrated severe RV/RA enlargement, severely reduced RV function and septal bowing; felt to be consistent with chronic process such as severe pulmonary HTN.  ?  ?PCCM has been consulted in this setting. ? ?Pertinent Medical History:  ?ESRD ?PE ?Pulmonary HTN  ?CAD ?CVA ?HLD ?HTN ?S/p MV repair  ?Pericardial effusion r/t minoxidil  ?Diastolic HF ?DM2 ? ?Significant Hospital Events: ?Including procedures, antibiotic start and stop dates in addition to other pertinent events   ?2/4 ED presentation with recurring chest pain -- STEMI ruled out. Stable, mildly hypoxic and placed on [place] ?2/5 Admit to [facility] for obvs. Concern for PE, ECHO performed by cardiology reportedly shows RV failure, but felt more consistent with chronic process like severe pulmHTN. Awaiting VQ scan. Pulm consulted  ?2/7 Cardiac cath. 3 vessel dz. CABG rec ?2/10 CABG, TVR ?2/11 Continued vasoplegic shock with very high vasopressor requirements  ?2/12 Given methylene blue x 2 ?2/13 Chest tube removed ?2/14 JP  removed ?2/15 pacing wires removed ?2/16 milrinone stopped, extubated ?2/17 Zosyn for GNR bacteremia-> Morganella morganii ?2/18 Arterial line, central line, vascath all removed and replaced. ?2/19 Milrinone back on. Co-ox improved. ECHO Left ventricular ejection fraction, by estimation, is 50 to 55%. The left ventricle has low normal function. No regional WM abnormality. Right Ventricle: Ventricular septum is flattened in systole and diastole  suggesting RV pressure and volume overload. The right ventricular size is  severely enlarged. Right vetricular wall thickness was not well  visualized. Right ventricular systolic  function is severely reduced. There is moderately elevated pulmonary  ?artery systolic pressure ?2/20 still high pressor needs. CRRT stopped. Having intermittent bouts of tachycardia ?2/21 Hemodynamics better. Weaning Epi. Still on Norepi and inotropic support. Supported on BIPAP over night and tolerated well. On am rounds more lethargic w/ generalized pain everywhere. Co-ox > 70. Glucose poorly controlled. ?2/22 Escalating pressor requirements (NE, Epi, Vaso). Continues on milrinone. Afib with RVR overnight, now with persistent AF (on amio, bival). Co-ox 73%. Hgb 7.3. WBC uptrending (25 from 20), Cr uptrending, CRRT restarted. Tolerating BiPAP. ?2/23 Improving pressor needs. Stable Epi/Vaso, downtitrating NE. Milrinone 0.25. Persistent AF, rate controlled. Co-ox 65.7%. Ongoing CRRT. BiPAP QHS. ?2/24 Continued decreasing pressor needs, stable epi/vaso, downtitrating Levo.  Milrinone 0.25.  Intermittent A-fib, in and out of NSR, rates 90s.  Co-ox 76.1%.  Ongoing CRRT, did not utilize BiPAP overnight. FEES with SLP. ?2/25 confused.  Pressor requirements down some.  Got 1 unit of blood for hemoglobin of 7, Co. ox dropped. Vasopressin decreased to 0.03 units.  ?3/2 stopped epinephrine, Co. oximetry dropped quickly ?3/3 back  on epinephrine, with plan to eventually titrate vasopressin off and trial  dobutamine.  Currently on room air.  Co. oximetry's in the 40s, hypoglycemic requiring titration down on basal dose insulin after changing tube feeds to nocturnal ? ?Interim History / Subjective:  ?Remains on CRRT, amio/epi/vaso drips. ?Nauseous with any attempts at PO. ?Having chills. ? ?Objective:  ?Blood pressure 100/64, pulse 87, temperature 98.4 ?F (36.9 ?C), resp. rate (!) 29, height 4' 11.5" (1.511 m), weight 71 kg, SpO2 97 %. ?PAP: (31-59)/(20-52) 48/25 ?CVP:  [8 mmHg-31 mmHg] 12 mmHg ?PCWP:  [17 mmHg-22 mmHg] 22 mmHg ?CO:  [3.4 L/min-5.9 L/min] 4.8 L/min ?CI:  [2.1 L/min/m2-3.6 L/min/m2] 2.9 L/min/m2  ?   ? ?Intake/Output Summary (Last 24 hours) at 01/28/2022 0828 ?Last data filed at 01/28/2022 0800 ?Gross per 24 hour  ?Intake 2559.6 ml  ?Output 3221 ml  ?Net -661.4 ml  ? ? ?Filed Weights  ? 02/18/2022 0230 01/26/22 0408 01/28/22 0345  ?Weight: 71.2 kg 70.8 kg 71 kg  ? ?Physical Examination: ?No distress shivering a bit under warming blanket ?R>L edema, global anasarca ongoing ?Moves all 4 ext to command ?Rectal tube in place, abdomen benign, +BS ?Mildly anxio [place] ? ?Chemistries stable on BMP ?WBC rising ?Afebrile but on CRRT ? ?Resolved problem list   ? ?Thrombocytopenia  ?Acute respiratory failure ?Assessment & Plan:  ?Acute decompensated R>L heart failure  ?with cardiogenic shock ?Mixed pulmonary HTN leading to above ?Atrial fibrillation on AC and amio ?Acute hypoxemic resp failure in setting of advanced multifactorial pulmonary HTN ?ICU delirium ?H/O PE ?CAD post CABG x3, TVR, PFO closure 01/14/2022 ?ESRD on HD- thrombosed AVF ?Probable renal vasoplegia complicating shock picture ?Dysphagia ?Anemia of chronic renal failure ?DM2 with hyperglycemia currently on levemir + SSI ?Morganella bacteremia treated ?Anemia stable ? ?- Check CXR, AM Pct given rising white count ?- Change standing insulin to increased levemir + SSI ?- PRN phenergan ?- Coox low but femoral so not sure what to do with this, would stop  checking ?- Titrate epi+vaso to MAP 65 ?- Neutral pull on CRRT ?- Longterm prognosis remains very guarded, husband has been updated regarding this ? ?Best Practice: (right click and "Reselect all SmartList Selections" daily)  ? ?Diet/type: tubefeeds  , dysphagia 3 ?DVT prophylaxis: other- bivalirudin ?GI prophylaxis: PPI ?Lines: Central line, Dialysis Catheter, Arterial Line, and yes and it is still needed  ?Foley:  N/A ?Code Status:  full code ?Last date of multidisciplinary goals of care discussion [Family updated 3/3 at bedside]  ? ? ? ?Patient critically ill due to shock ?Interventions to address this today pressor titration ?Risk of deterioration without these interventions is high ? ?I personally spent 31 minutes providing critical care not including any separately billable procedures ? ?Erskine Emery MD ?[facility] Pulmonary Critical Care ? ?Prefer epic messenger for cross cover needs ?If after hours, please call E-link ? ? ?

## 2022-01-29 DIAGNOSIS — J9601 Acute respiratory failure with hypoxia: Secondary | ICD-10-CM | POA: Diagnosis not present

## 2022-01-29 DIAGNOSIS — R570 Cardiogenic shock: Secondary | ICD-10-CM | POA: Diagnosis not present

## 2022-01-29 LAB — RENAL FUNCTION PANEL
Albumin: 2.4 g/dL — ABNORMAL LOW (ref 3.5–5.0)
Albumin: 2.3 g/dL — ABNORMAL LOW (ref 3.5–5.0)
Anion gap: 11 (ref 5–15)
Anion gap: 10 (ref 5–15)
BUN: 11 mg/dL (ref 8–23)
BUN: 11 mg/dL (ref 8–23)
CO2: 22 mmol/L (ref 22–32)
CO2: 23 mmol/L (ref 22–32)
Calcium: 9.7 mg/dL (ref 8.9–10.3)
Calcium: 9.2 mg/dL (ref 8.9–10.3)
Chloride: 102 mmol/L (ref 98–111)
Chloride: 102 mmol/L (ref 98–111)
Creatinine, Ser: 1.12 mg/dL — ABNORMAL HIGH (ref 0.44–1.00)
Creatinine, Ser: 0.97 mg/dL (ref 0.44–1.00)
GFR, Estimated: 54 mL/min — ABNORMAL LOW (ref >60)
GFR, Estimated: >60 mL/min (ref >60)
Glucose, Bld: 240 mg/dL — ABNORMAL HIGH (ref 70–99)
Glucose, Bld: 148 mg/dL — ABNORMAL HIGH (ref 70–99)
Phosphorus: 1.7 mg/dL — ABNORMAL LOW (ref 2.5–4.6)
Phosphorus: 3.8 mg/dL (ref 2.5–4.6)
Potassium: 4.7 mmol/L (ref 3.5–5.1)
Potassium: 4.2 mmol/L (ref 3.5–5.1)
Sodium: 135 mmol/L (ref 135–145)
Sodium: 135 mmol/L (ref 135–145)

## 2022-01-29 LAB — CBC WITH DIFFERENTIAL/PLATELET
Abs Immature Granulocytes: 0.2 10*3/uL — ABNORMAL HIGH (ref 0.00–0.07)
Basophils Absolute: 0.1 10*3/uL (ref 0.0–0.1)
Basophils Relative: 1 %
Eosinophils Absolute: 1 10*3/uL — ABNORMAL HIGH (ref 0.0–0.5)
Eosinophils Relative: 5 %
HCT: 27.2 % — ABNORMAL LOW (ref 36.0–46.0)
Hemoglobin: 8.1 g/dL — ABNORMAL LOW (ref 12.0–15.0)
Immature Granulocytes: 1 %
Lymphocytes Relative: 6 %
Lymphs Abs: 1.2 10*3/uL (ref 0.7–4.0)
MCH: 30.5 pg (ref 26.0–34.0)
MCHC: 29.8 g/dL — ABNORMAL LOW (ref 30.0–36.0)
MCV: 102.3 fL — ABNORMAL HIGH (ref 80.0–100.0)
Monocytes Absolute: 1.7 10*3/uL — ABNORMAL HIGH (ref 0.1–1.0)
Monocytes Relative: 8 %
Neutro Abs: 17.2 10*3/uL — ABNORMAL HIGH (ref 1.7–7.7)
Neutrophils Relative %: 79 %
Platelets: 159 10*3/uL (ref 150–400)
RBC: 2.66 MIL/uL — ABNORMAL LOW (ref 3.87–5.11)
RDW: 24.7 % — ABNORMAL HIGH (ref 11.5–15.5)
WBC: 21.4 10*3/uL — ABNORMAL HIGH (ref 4.0–10.5)
nRBC: 1.6 % — ABNORMAL HIGH (ref 0.0–0.2)

## 2022-01-29 LAB — HEPARIN LEVEL (UNFRACTIONATED): Heparin Unfractionated: 0.19 IU/mL — ABNORMAL LOW (ref 0.30–0.70)

## 2022-01-29 LAB — MAGNESIUM: Magnesium: 2.4 mg/dL (ref 1.7–2.4)

## 2022-01-29 LAB — GLUCOSE, CAPILLARY
Glucose-Capillary: 112 mg/dL — ABNORMAL HIGH (ref 70–99)
Glucose-Capillary: 123 mg/dL — ABNORMAL HIGH (ref 70–99)
Glucose-Capillary: 143 mg/dL — ABNORMAL HIGH (ref 70–99)
Glucose-Capillary: 203 mg/dL — ABNORMAL HIGH (ref 70–99)
Glucose-Capillary: 230 mg/dL — ABNORMAL HIGH (ref 70–99)
Glucose-Capillary: 83 mg/dL (ref 70–99)

## 2022-01-29 LAB — ZINC: Zinc: 98 ug/dL (ref 44–115)

## 2022-01-29 LAB — COPPER, SERUM: Copper: 156 ug/dL (ref 80–158)

## 2022-01-29 LAB — PROCALCITONIN: Procalcitonin: 1.05 ng/mL

## 2022-01-29 LAB — VITAMIN B1: Vitamin B1 (Thiamine): 214.3 nmol/L — ABNORMAL HIGH (ref 66.5–200.0)

## 2022-01-29 MED ORDER — INSULIN DETEMIR 100 UNIT/ML ~~LOC~~ SOLN
20.0000 [IU] | Freq: Two times a day (BID) | SUBCUTANEOUS | Status: DC
  Administered 2022-01-29 – 2022-01-30 (×3): 20 [IU] via SUBCUTANEOUS
  Filled 2022-01-29 (×5): qty 0.2

## 2022-01-29 MED ORDER — SODIUM PHOSPHATES 45 MMOLE/15ML IV SOLN
30.0000 mmol | Freq: Once | INTRAVENOUS | Status: AC
  Administered 2022-01-29: 30 mmol via INTRAVENOUS
  Filled 2022-01-29: qty 10

## 2022-01-29 MED ORDER — VANCOMYCIN HCL 750 MG/150ML IV SOLN
750.0000 mg | INTRAVENOUS | Status: AC
  Administered 2022-01-30 – 2022-01-31 (×2): 750 mg via INTRAVENOUS
  Filled 2022-01-29 (×2): qty 150

## 2022-01-29 MED ORDER — LIP MEDEX EX OINT
TOPICAL_OINTMENT | CUTANEOUS | Status: DC | PRN
  Administered 2022-01-29: 75 via TOPICAL
  Filled 2022-01-29 (×2): qty 7

## 2022-01-29 MED ORDER — VANCOMYCIN HCL 1500 MG/300ML IV SOLN
1500.0000 mg | Freq: Once | INTRAVENOUS | Status: AC
  Administered 2022-01-29: 1500 mg via INTRAVENOUS
  Filled 2022-01-29: qty 300

## 2022-01-29 MED ORDER — SODIUM CHLORIDE 0.9 % IV SOLN
2.0000 g | Freq: Two times a day (BID) | INTRAVENOUS | Status: DC
  Administered 2022-01-29 – 2022-01-31 (×5): 2 g via INTRAVENOUS
  Filled 2022-01-29 (×5): qty 2

## 2022-01-29 NOTE — Progress Notes (Signed)
? ?NAME:  Jody Nguyen, MRN:  [id number], DOB:  [birth year], LOS: 29 ?ADMISSION DATE:  12/29/2021, CONSULTATION DATE:  01/01/2022 ?REFERRING MD:  Marlowe Sax - TRH, CHIEF COMPLAINT:  SOB  ? ?History of Present Illness:  ?66 year old woman who presented to [facility] ED 2/4 with CP radiating to her arm. PMHx significant for  ESRD (on HD), non-obstructive CAD, suspected pulmonary hypertension, PE (on Eliquis). Recent ED presentation 2/1 for similar symptoms (troponin 100s). F/u with Cardiology as an outpatient 2/2 and CP was felt to be related to volume shift (occurring during dialysis).  ? ?On ED presentation 2/4, patient reported episodic chest pain, non-exertional and intermittent x 1 week with associated nausea and SOB. Non-specific EKG changes. Cardiology consulted, STEMI ruled out. Troponin elevated at 200. Placed on [place] for SpO2 80s. Admitted to [facility] for observation with concern for PE and started on heparin gtt. VQ with low probability for PE. Echo 2/19 demonstrated severe RV/RA enlargement, severely reduced RV function and septal bowing; felt to be consistent with chronic process such as severe pulmonary HTN.  ?  ?PCCM has been consulted in this setting. ? ?Pertinent Medical History:  ?ESRD ?PE ?Pulmonary HTN  ?CAD ?CVA ?HLD ?HTN ?S/p MV repair  ?Pericardial effusion r/t minoxidil  ?Diastolic HF ?DM2 ? ?Significant Hospital Events: ?Including procedures, antibiotic start and stop dates in addition to other pertinent events   ?2/4 ED presentation with recurring chest pain -- STEMI ruled out. Stable, mildly hypoxic and placed on [place] ?2/5 Admit to [facility] for obvs. Concern for PE, ECHO performed by cardiology reportedly shows RV failure, but felt more consistent with chronic process like severe pulmHTN. Awaiting VQ scan. Pulm consulted  ?2/7 Cardiac cath. 3 vessel dz. CABG rec ?2/10 CABG, TVR ?2/11 Continued vasoplegic shock with very high vasopressor requirements  ?2/12 Given methylene blue x 2 ?2/13 Chest tube removed ?2/14 JP  removed ?2/15 pacing wires removed ?2/16 milrinone stopped, extubated ?2/17 Zosyn for GNR bacteremia-> Morganella morganii ?2/18 Arterial line, central line, vascath all removed and replaced. ?2/19 Milrinone back on. Co-ox improved. ECHO Left ventricular ejection fraction, by estimation, is 50 to 55%. The left ventricle has low normal function. No regional WM abnormality. Right Ventricle: Ventricular septum is flattened in systole and diastole  suggesting RV pressure and volume overload. The right ventricular size is  severely enlarged. Right vetricular wall thickness was not well  visualized. Right ventricular systolic  function is severely reduced. There is moderately elevated pulmonary  ?artery systolic pressure ?2/20 still high pressor needs. CRRT stopped. Having intermittent bouts of tachycardia ?2/21 Hemodynamics better. Weaning Epi. Still on Norepi and inotropic support. Supported on BIPAP over night and tolerated well. On am rounds more lethargic w/ generalized pain everywhere. Co-ox > 70. Glucose poorly controlled. ?2/22 Escalating pressor requirements (NE, Epi, Vaso). Continues on milrinone. Afib with RVR overnight, now with persistent AF (on amio, bival). Co-ox 73%. Hgb 7.3. WBC uptrending (25 from 20), Cr uptrending, CRRT restarted. Tolerating BiPAP. ?2/23 Improving pressor needs. Stable Epi/Vaso, downtitrating NE. Milrinone 0.25. Persistent AF, rate controlled. Co-ox 65.7%. Ongoing CRRT. BiPAP QHS. ?2/24 Continued decreasing pressor needs, stable epi/vaso, downtitrating Levo.  Milrinone 0.25.  Intermittent A-fib, in and out of NSR, rates 90s.  Co-ox 76.1%.  Ongoing CRRT, did not utilize BiPAP overnight. FEES with SLP. ?2/25 confused.  Pressor requirements down some.  Got 1 unit of blood for hemoglobin of 7, Co. ox dropped. Vasopressin decreased to 0.03 units.  ?3/2 stopped epinephrine, Co. oximetry dropped quickly ?3/3 back  on epinephrine, with plan to eventually titrate vasopressin off and trial  dobutamine.  Currently on room air.  Co. oximetry's in the 40s, hypoglycemic requiring titration down on basal dose insulin after changing tube feeds to nocturnal ? ?Interim History / Subjective:  ?Off levo ?Currently on 4 of epi ? ?Objective:  ?Blood pressure (!) 75/53, pulse 85, temperature 98.2 ?F (36.8 ?C), resp. rate 20, height 4' 11.5" (1.511 m), weight 70.6 kg, SpO2 90 %. ?PAP: (35-57)/(17-34) 47/18 ?CVP:  [4 mmHg-17 mmHg] 6 mmHg ?CO:  [4.3 L/min-5.6 L/min] 4.9 L/min ?CI:  [2.6 L/min/m2-3.3 L/min/m2] 2.9 L/min/m2  ?   ? ?Intake/Output Summary (Last 24 hours) at 01/29/2022 0820 ?Last data filed at 01/29/2022 0800 ?Gross per 24 hour  ?Intake 3183.14 ml  ?Output 2737 ml  ?Net 446.14 ml  ? ? ?Filed Weights  ? 01/26/22 0408 01/28/22 0345 01/29/22 0445  ?Weight: 70.8 kg 71 kg 70.6 kg  ? ?Physical Examination: ?No distress, a bit more alert than yesterday ?Moves all 4 ext to pain, weak ?Poor inspiratory effort, bilateral lungs clear ?Heart sounds regular, ext warm under warming blanker ?Mild anasarca stable ? ?Chemistries stable on BMP ?WBC rising still ?Afebrile but on CRRT ? ? ?Assessment & Plan:  ?Acute decompensated R>L heart failure  ?with cardiogenic shock ?Mixed pulmonary HTN leading to above ?Atrial fibrillation on AC and amio ?Acute hypoxemic resp failure in setting of advanced multifactorial pulmonary HTN ?ICU delirium ?H/O PE ?CAD post CABG x3, TVR, PFO closure 12/29/2021 ?ESRD on HD- thrombosed AVF ?Probable renal vasoplegia complicating shock picture ?Dysphagia ?Anemia of chronic renal failure ?DM2 with hyperglycemia currently on levemir + SSI ?Morganella bacteremia treated ?Anemia stable ? ?- Pct and temp curve up, will treat empirically with vanc+cefepime x 7 days ?- Increase levemir + SSI ?- PRN phenergan ?- Titrate epi to SBP 90, continue increased dose midodrine ?- Neutral pull on CRRT ?- Longterm prognosis remains very guarded, husband has been updated regarding this 3/3 ? ?Best Practice: (right click  and "Reselect all SmartList Selections" daily)  ? ?Diet/type: tubefeeds  , dysphagia 3 ?DVT prophylaxis: other- bivalirudin ?GI prophylaxis: PPI ?Lines: Central line, Dialysis Catheter, Arterial Line, and yes and it is still needed  ?Foley:  N/A ?Code Status:  full code ?Last date of multidisciplinary goals of care discussion [Family updated 3/4 at bedside]  ? ?Patient critically ill due to shock ?Interventions to address this today pressor titration ?Risk of deterioration without these interventions is high ? ?I personally spent 34 minutes providing critical care not including any separately billable procedures ? ?Erskine Emery MD ?[facility] Pulmonary Critical Care ? ?Prefer epic messenger for cross cover needs ?If after hours, please call E-link ? ? ?

## 2022-01-29 NOTE — Progress Notes (Signed)
Pharmacy Antibiotic Note ? ?Jody Nguyen is a 66 y.o. female admitted on 01/16/2022 with Morganella bacteremia and treated with Zosyn - completed 2/26.  WBC have increased up to 21, PCT increasing up to 1 Tm 99 but on CRRT ?Pharmacy has been consulted for 7 days tx cefepime and vancomycin dosing.   ? ?Plan: ?Cefepime 2gm IV q12h on CRRT ?Vancomycin 1500mg  x1 then 1gm q24hr  ?Stop date in for 7 days tx ? ?Height: 4' 11.5" (151.1 cm) ?Weight: 70.6 kg (155 lb 10.3 oz) ?IBW/kg (Calculated) : 44.35 ? ?Temp (24hrs), Avg:98.9 ?F (37.2 ?C), Min:97.9 ?F (36.6 ?C), Max:100.2 ?F (37.9 ?C) ? ?Recent Labs  ?Lab 02/01/2022 ?0415 02/12/2022 ?1637 01/26/22 ?0322 01/26/22 ?1542 01/27/22 ?0310 01/27/22 ?1619 01/28/22 ?6301 01/28/22 ?1610 01/29/22 ?0345  ?WBC 13.2*  --  12.6*  --  13.3*  --  17.8*  --  21.4*  ?CREATININE 1.23*   < > 1.24*   < > 1.12* 1.06* 1.06* 1.01* 1.12*  ? < > = values in this interval not displayed.  ? ?  ?Estimated Creatinine Clearance: 42.8 mL/min (A) (by C-G formula based on SCr of 1.12 mg/dL (H)).   ? ?Allergies  ?Allergen Reactions  ? Minoxidil Other (See Comments)  ?  Pericardial effusion  ? Lipitor [Atorvastatin] Other (See Comments)  ?  MYALGIAS > "pain in legs" ?Tolerates rosuvastatin  ? Morphine And Related Itching  ? Ace Inhibitors Other (See Comments)  ?  REACTION: "not sure...think it made me drowsy all the time"  ? ?Vancomycin 2/17 > 2/18  resume 3/5>(3/11) ?Cefepime 3/5. (3/11) ?Zosyn 2/17 > 2/26 ? ?2/17 BCx x 2 > morganella - S to zosyn ?2/17 MRSA PCR neg ? ? ? ?Bonnita Nasuti Pharm.D. CPP, BCPS ?Clinical Pharmacist ?[phone] ?01/29/2022 10:00 AM  ?  ? ?[facility] pharmacy phone numbers are listed on [email or web address] ? ? ?

## 2022-01-29 NOTE — Progress Notes (Signed)
RT note: RT discontinued prn BIPAP per protocol. ?

## 2022-01-29 NOTE — Progress Notes (Signed)
ANTICOAGULATION CONSULT NOTE ? ?Pharmacy Consult for bivalirudin>>heparin ?Indication: history of pulmonary embolus 2021 ? ?Allergies  ?Allergen Reactions  ? Minoxidil Other (See Comments)  ?  Pericardial effusion  ? Lipitor [Atorvastatin] Other (See Comments)  ?  MYALGIAS > "pain in legs" ?Tolerates rosuvastatin  ? Morphine And Related Itching  ? Ace Inhibitors Other (See Comments)  ?  REACTION: "not sure...think it made me drowsy all the time"  ? ? ?Patient Measurements: ?Height: 4' 11.5" (151.1 cm) ?Weight: 70.6 kg (155 lb 10.3 oz) ?IBW/kg (Calculated) : 44.35 ?Heparin DW: 70.8kg  ? ?Vital Signs: ?Temp: 98.2 ?F (36.8 ?C) (03/05 0915) ?Temp Source: Core (03/05 0715) ?Pulse Rate: 84 (03/05 0915) ? ?Labs: ?Recent Labs  ?  01/27/22 ?0310 01/27/22 ?2458 01/27/22 ?1619 01/28/22 ?0998 01/28/22 ?1610 01/29/22 ?0345  ?HGB 7.7*  --   --  8.1*  --  8.1*  ?HCT 24.9*  --   --  26.6*  --  27.2*  ?PLT 141*  --   --  152  --  159  ?HEPARINUNFRC 0.30 0.39  --  0.55  --  0.19*  ?CREATININE 1.12*  --    < > 1.06* 1.01* 1.12*  ? < > = values in this interval not displayed.  ? ? ? ?Estimated Creatinine Clearance: 42.8 mL/min (A) (by C-G formula based on SCr of 1.12 mg/dL (H)). ? ? ?Assessment: ?66 yo female on chronic apixaban for hx of PE 2021. Apixaban has been held for surgery.  Pharmacy asked to resume anticoagulation post op 2/15 with bivalirudin.  HIT antibody and SRA both negative.  Platelet count now recovered 50>208>150-160s.  ? ?Patient on heparin (s/p bivalirudin) as HIT work-up negative. ?Heparin level 0.19 < goal after heparin drip rate reduced to 1000 units/hr. No bleeding noted Hgb stable 7-8. PLTC recovered  stable 140-150.  ? ?Goal of Therapy:  ?Heparin level 0.3-0.5 - target lower end given low platelet count/risk of post-operative bleeding ?Monitor platelets by anticoagulation protocol: Yes ?  ?Plan:  ?-Increase  Heparin drip back to 1100 units/hr ?-HL and CBC daily while on heparin ?-Monitor for s/x of  bleeding ? ? ?Bonnita Nasuti Pharm.D. CPP, BCPS ?Clinical Pharmacist ?[phone] ?01/29/2022 9:53 AM  ? ? ?Please check [email or web address] for unit-specific pharmacy phone numbers. ? ? ?

## 2022-01-29 NOTE — Progress Notes (Signed)
?[facility] ?Progress Note  ? ?Assessment/ Plan:   ?OP HD: MWF [facility] ?3:15h   450/A1.5   75.5kg   2K/2Ca bath  P2   AVF  Hep  2500 ?- Hectoral 30mcg IV q HD ?- Sensipar $RemoveBef'90mg'ddqqmYaNEF$  PO q HD ?- Mircera 175mcg IV q 2 weeks (ordered, not yet given. Last Mircera 100 on 12/07/21) ?  ?  ?  ?Assessment/Plan:  ? CAD s/p CABG x3, TR sp TVR:  per CT surgery  ? ESRD:  normally MWF schedule.  Has been on CRRT with a small holiday which she did not tolerate since 2/11.  CVP is 5 today- keep even. ?Shock - cardiogenic and septic. Has RV failure- RHC 02/06/2022--> more well-compensated hemodynamics.  Weaning support per AHF.  Co-ox is still in the 40s, on epi, midodrine, off vaso.   ?Morganella bacteremia -  s/p Zosyn ? Anemia CKD: Sq darbe 100ug weekly on Saturday. 1st 2/25. ? Metabolic bone disease: resume home binders and sensipar when taking PO. Cont IV vdra.  ? Infiltrated AVF - AVF infiltrated on 2/7. Post-op AVF is nonfunctional, VVS aware. Planning for [facility] when more stable.  ?Pulm HTN:  Followed at [facility], holding macitenan (opsumit) ? T2DM-per primary service ? Hx of MV replacement - in 2016 ?Abd pain: still having stools- if continues will consider AXR, defer to primary ?Dispo: ICU ? ?Subjective:   ? ? Vaso weaned off and Epi gtt at 68.  On Midodrine 15 TID.  Reports "gas pain" this AM.     ? ?Objective:   ?BP (!) 75/53 Comment: following aline  Pulse 83   Temp 98.1 ?F (36.7 ?C)   Resp (!) 29   Ht 4' 11.5" (1.511 m)   Wt 70.6 kg   SpO2 92%   BMI 30.91 kg/m?  ? ?Physical Exam: ?XBL:TJQZE, appears uncomfortable ?CVS: tachy ?Resp: coarse ?Abd: soft ?Ext: 1+ anasarca, worse in R UE where clotted AVF is, L sided fem swan ?ACCESS: nontunneled HD cath R fem ? ?Labs: ?BMET ?Recent Labs  ?Lab 01/26/22 ?0322 01/26/22 ?1542 01/27/22 ?0310 01/27/22 ?1619 01/28/22 ?0923 01/28/22 ?1610 01/29/22 ?0345  ?NA 134* 138 135 137 134* 137 135  ?K 4.6 4.2 4.2 4.5 4.8 4.3 4.7  ?CL 100 103 102 102 100 103 102  ?CO2 $Rem'24 27 25 27 'xLFD$ 21* 25 22   ?GLUCOSE 266* 80 72 101* 200* 130* 240*  ?BUN $Rem'13 13 12 9 10 10 11  'BPNz$ ?CREATININE 1.24* 1.21* 1.12* 1.06* 1.06* 1.01* 1.12*  ?CALCIUM 9.4 9.5 9.5 9.5 9.5 9.8 9.7  ?PHOS 3.0 2.5 2.0* 2.7 2.5 2.1* 1.7*  ? ?CBC ?Recent Labs  ?Lab 01/26/22 ?0322 01/27/22 ?0310 01/28/22 ?3007 01/29/22 ?0345  ?WBC 12.6* 13.3* 17.8* 21.4*  ?NEUTROABS 9.9* 9.8* 15.0* 17.2*  ?HGB 7.8* 7.7* 8.1* 8.1*  ?HCT 25.8* 24.9* 26.6* 27.2*  ?MCV 100.0 99.2 101.9* 102.3*  ?PLT 156 141* 152 159  ? ? ?  ?Medications:   ? ? aspirin EC  325 mg Oral Daily  ? Or  ? aspirin  324 mg Per Tube Daily  ? chlorhexidine gluconate (MEDLINE KIT)  15 mL Mouth Rinse BID  ? Chlorhexidine Gluconate Cloth  6 each Topical Daily  ? darbepoetin (ARANESP) injection - NON-DIALYSIS  100 mcg Subcutaneous Q Sat-1800  ? doxercalciferol  9 mcg Intravenous Q M,W,F-HD  ? ezetimibe  10 mg Oral Daily  ? feeding supplement (PROSource TF)  45 mL Per Tube BID  ? feeding supplement (VITAL 1.5 CAL)  900  mL Per Tube Q24H  ? insulin aspart  0-20 Units Subcutaneous Q4H  ? insulin detemir  20 Units Subcutaneous BID  ? latanoprost  1 drop Both Eyes QHS  ? midodrine  15 mg Oral Q6H  ? multivitamin  1 tablet Per Tube QHS  ? pantoprazole sodium  40 mg Per Tube Daily  ? rosuvastatin  10 mg Per Tube QHS  ? sodium chloride flush  10-40 mL Intracatheter Q12H  ? sodium chloride flush  3 mL Intravenous Q12H  ? ? ? ?Madelon Lips, MD ?01/29/2022, 9:14 AM   ?

## 2022-01-29 NOTE — Progress Notes (Signed)
4 Days Post-Op Procedure(s) (LRB): ?RIGHT HEART CATH (N/A) ?Subjective: ?Says she hurts all over. ? ?Epi weaned to 2 mcg so far and CI 2.9. PA 46/20, CVP 12 ? ?Keeping even with CRRT. ? ?Objective: ?Vital signs in last 24 hours: ?Temp:  [97.9 ?F (36.6 ?C)-100.2 ?F (37.9 ?C)] 97.9 ?F (36.6 ?C) (03/05 1000) ?Pulse Rate:  [80-95] 80 (03/05 1000) ?Cardiac Rhythm: Normal sinus rhythm (03/05 0715) ?Resp:  [14-30] 17 (03/05 1000) ?BP: (72-181)/(45-164) 75/53 (03/04 2100) ?SpO2:  [85 %-100 %] 97 % (03/05 1000) ?Arterial Line BP: (90-120)/(35-56) 92/42 (03/05 1000) ?Weight:  [70.6 kg] 70.6 kg (03/05 0445) ? ?Hemodynamic parameters for last 24 hours: ?PAP: (35-57)/(17-34) 46/20 ?CVP:  [4 mmHg-17 mmHg] 12 mmHg ?CO:  [4.3 L/min-5.6 L/min] 4.9 L/min ?CI:  [2.6 L/min/m2-3.3 L/min/m2] 2.9 L/min/m2 ? ?Intake/Output from previous day: ?03/04 0701 - 03/05 0700 ?In: 3192.2 [P.O.:360; I.V.:1339.7; NG/GT:1262.5] ?Out: 2748 [Stool:300] ?Intake/Output this shift: ?Total I/O ?In: 279.8 [I.V.:153.7; NG/GT:120; IV Piggyback:6.1] ?Out: 286 [Other:286] ? ?General appearance: alert and cooperative ?Neurologic: intact ?Heart: regular rate and rhythm ?Lungs: clear to auscultation bilaterally ?Abdomen: soft, non-tender; bowel sounds normal  ?Extremities: moderate edema ?Wound: incision healing well. Chest tube sutures still in. ? ?Lab Results: ?Recent Labs  ?  01/28/22 ?7262 01/29/22 ?0345  ?WBC 17.8* 21.4*  ?HGB 8.1* 8.1*  ?HCT 26.6* 27.2*  ?PLT 152 159  ? ?BMET:  ?Recent Labs  ?  01/28/22 ?1610 01/29/22 ?0345  ?NA 137 135  ?K 4.3 4.7  ?CL 103 102  ?CO2 25 22  ?GLUCOSE 130* 240*  ?BUN 10 11  ?CREATININE 1.01* 1.12*  ?CALCIUM 9.8 9.7  ?  ?PT/INR: No results for input(s): LABPROT, INR in the last 72 hours. ?ABG ?   ?Component Value Date/Time  ? PHART 7.425 01/20/2022 1944  ? HCO3 26.2 02/15/2022 0821  ? TCO2 27 02/17/2022 0821  ? ACIDBASEDEF 1.0 01/20/2022 1944  ? O2SAT 43.7 01/28/2022 0447  ? ?CBG (last 3)  ?Recent Labs  ?  01/28/22 ?2352  01/29/22 ?0355 01/29/22 ?0745  ?GLUCAP 197* 230* 203*  ? ? ?Assessment/Plan: ? ?Hemodynamics stable with weaning of epi with stable CI 2.9 this am. Continue to wean off as tolerated. ? ?CRRT to keep even for now. Will need to gradually remove volume with severe RV failure. ? ?Low grade fever of 100 yesterday with rising WBC ct to 21 and PCT 1.05. started on empiric vanc and cefepime. Higher risk of infection with groin lines in. ? ? LOS: 28 days  ? ? ?Jody Nguyen ?01/29/2022 ? ? ?

## 2022-01-29 NOTE — Progress Notes (Signed)
Advanced Heart Failure Rounding Note  PCP-Cardiologist: [name], MD   Subjective:    3/1: RHC with swan placement (on NE 2, EPi 3, Milrinone 0.25 and VP 0.02) w/ surprisingly well-compensated hemodynamics (see data below). Milrinone discontinued given low SVR  This morning, she is on epinephrine 2, off vasopressin.  She remains on CVVH, running UF even.   She is in NSR on amiodarone gtt.   Nauseated this morning.   PCT 1.05, Tm 100, WBCs 17.8 => 21  Swan #s today  CVP 9 PA 47/23  CI 2.9 PAPI 2.7  RHC 3/1 (on 4 vasopressors/inotropes)   Ao = 104/48 (63) RA = 11 RV = 35/10 PA = 53/26 (34) PCW = 17  Fick cardiac output/index = 6.4/3.8 Thermal CO/CI = 5.2/3.1 PVR (Fick) = 2.7 PVR (Therm) = 3.3 SVR (Fick) = 650 SVR (Therm) = 807 Ao sat = 92% PA sat = 58%, 57%  Objective:   Weight Range: 70.6 kg Body mass index is 30.91 kg/m.   Vital Signs:   Temp:  [97.9 F (36.6 C)-100.2 F (37.9 C)] 98.2 F (36.8 C) (03/05 0900) Pulse Rate:  [80-95] 85 (03/05 0900) Resp:  [14-30] 17 (03/05 0900) BP: (72-181)/(45-164) 75/53 (03/04 2100) SpO2:  [90 %-100 %] 94 % (03/05 0900) Arterial Line BP: (90-120)/(35-56) 102/46 (03/05 0900) Weight:  [70.6 kg] 70.6 kg (03/05 0445) Last BM Date : 01/29/22  Weight change: Filed Weights   01/26/22 0408 01/28/22 0345 01/29/22 0445  Weight: 70.8 kg 71 kg 70.6 kg    Intake/Output:   Intake/Output Summary (Last 24 hours) at 01/29/2022 0923 Last data filed at 01/29/2022 0900 Gross per 24 hour  Intake 3271.53 ml  Output 2761 ml  Net 510.53 ml      Physical Exam    CVP 9 General: NAD Neck: JVP 8-9 cm, no thyromegaly or thyroid nodule.  Lungs: Decreased at bases.  CV: Nondisplaced PMI.  Heart regular S1/S2, no S3/S4, 3/6 HSM LLSB.  Trace ankle edema.  Abdomen: Soft, nontender, no hepatosplenomegaly, no distention.  Skin: Intact without lesions or rashes.  Neurologic: Alert and oriented x 3.  Psych: Normal  affect. Extremities: No clubbing or cyanosis.  HEENT: Normal.    Telemetry   NSR 80s  personally checked.   EKG    N/A   Labs    CBC Recent Labs    01/28/22 0335 01/29/22 0345  WBC 17.8* 21.4*  NEUTROABS 15.0* 17.2*  HGB 8.1* 8.1*  HCT 26.6* 27.2*  MCV 101.9* 102.3*  PLT 152 696   Basic Metabolic Panel Recent Labs    01/28/22 0335 01/28/22 1610 01/29/22 0345  NA 134* 137 135  K 4.8 4.3 4.7  CL 100 103 102  CO2 21* 25 22  GLUCOSE 200* 130* 240*  BUN _0 CREATININE 1.06* 1.01* 1.12*  CALCIUM 9.5 9.8 9.7  MG 2.4  --  2.4  PHOS 2.5 2.1* 1.7*   Liver Function Tests Recent Labs    01/28/22 1610 01/29/22 0345  ALBUMIN 2.5* 2.4*   No results for input(s): LIPASE, AMYLASE in the last 72 hours. Cardiac Enzymes No results for input(s): CKTOTAL, CKMB, CKMBINDEX, TROPONINI in the last 72 hours.  BNP: BNP (last 3 results) Recent Labs    12/20/21 2246 01/01/22 0941  BNP 1,753.1* 1,432.4*    ProBNP (last 3 results) No results for input(s): PROBNP in the last 8760 hours.   D-Dimer No results for input(s): DDIMER in the  last 72 hours. Hemoglobin A1C No results for input(s): HGBA1C in the last 72 hours. Fasting Lipid Panel No results for input(s): CHOL, HDL, LDLCALC, TRIG, CHOLHDL, LDLDIRECT in the last 72 hours. Thyroid Function Tests No results for input(s): TSH, T4TOTAL, T3FREE, THYROIDAB in the last 72 hours.  Invalid input(s): FREET3  Other results:   Imaging    DG CHEST PORT 1 VIEW  Result Date: 01/28/2022 CLINICAL DATA:  Status post CABG. Evaluate airspace disease, atelectasis, pleural effusion or pneumothorax. EXAM: PORTABLE CHEST 1 VIEW COMPARISON:  01/29/2022 FINDINGS: Feeding tube tip is below the level of the hemidiaphragms. There is a pulmonary arterial catheter from femoral insertion with tip projecting over the left main pulmonary artery. Opacification within the left lower lung with multiple air bronchograms appears unchanged  from previous study. The remaining portions of the lungs are clear. No signs of a pleural effusion or pneumothorax. IMPRESSION: No change in aeration to the left lower lung. Electronically Signed   By: Kerby Moors M.D.   On: 01/28/2022 10:22     Medications:     Scheduled Medications:  aspirin EC  325 mg Oral Daily   Or   aspirin  324 mg Per Tube Daily   chlorhexidine gluconate (MEDLINE KIT)  15 mL Mouth Rinse BID   Chlorhexidine Gluconate Cloth  6 each Topical Daily   darbepoetin (ARANESP) injection - NON-DIALYSIS  100 mcg Subcutaneous Q Sat-1800   doxercalciferol  9 mcg Intravenous Q M,W,F-HD   ezetimibe  10 mg Oral Daily   feeding supplement (PROSource TF)  45 mL Per Tube BID   feeding supplement (VITAL 1.5 CAL)  900 mL Per Tube Q24H   insulin aspart  0-20 Units Subcutaneous Q4H   insulin detemir  20 Units Subcutaneous BID   latanoprost  1 drop Both Eyes QHS   midodrine  15 mg Oral Q6H   multivitamin  1 tablet Per Tube QHS   pantoprazole sodium  40 mg Per Tube Daily   rosuvastatin  10 mg Per Tube QHS   sodium chloride flush  10-40 mL Intracatheter Q12H   sodium chloride flush  3 mL Intravenous Q12H    Infusions:   prismasol BGK 4/2.5 500 mL/hr at 01/29/22 0522    prismasol BGK 4/2.5 500 mL/hr at 01/29/22 0522   sodium chloride     sodium chloride 10 mL/hr at 01/29/22 0900   sodium chloride Stopped (01/26/22 0735)   sodium chloride 10 mL/hr at 01/29/22 0900   amiodarone 30 mg/hr (01/29/22 0900)   epinephrine 3 mcg/min (01/29/22 0900)   heparin 1,000 Units/hr (01/29/22 0900)   norepinephrine (LEVOPHED) Adult infusion Stopped (01/27/22 1433)   prismasol BGK 4/2.5 1,400 mL/hr at 01/29/22 0911   sodium phosphate  Dextrose 5% IVPB     vasopressin Stopped (01/28/22 1200)    PRN Medications: sodium chloride, sodium chloride, alteplase, bisacodyl **OR** bisacodyl, docusate, food thickener, Gerhardt's butt cream, heparin, hydrOXYzine, lip balm, ondansetron (ZOFRAN) IV,  oxyCODONE, sodium chloride, sodium chloride flush, sodium chloride flush, traMADol    Patient Profile   66 y/o female with obesity, T2DM, previous PE, ESRD and mitral regurgitation, 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 Subsequently followed at [place] for mixed [place] on macitentan and sildenafil. Admitted 2/5 for CP. LHC showed severe 3v CAD with CTO of RCA & LCX and critical ostial LAD lesion. Initial echo, EF 40-45%, no MR, mod MS, RV mildly reduced w/ severe TR. Underwent CABG w/ PFO closure and tricuspid valve  repair/valvuloplasty w/ ring 01/19/2022. Complicated post CABG course with RV failure, GN bacteremia and persistent hypotension and inability to wean off pressors. Nephrology following for CVVH.  Assessment/Plan   1. Post-cardiotomy cardiogenic shock - likely due predominantly to end-stage RV failure. (Required ECMO in 2016 post MVR). Echo 01/15/22 with EF 50-55%, D-shaped septum with severe RV dysfunction and enlargement, s/p MV repair, s/p TV repair but still with severe TR.  - Post op platelets down to 30. Switched to Bival. HIT negative. Plts improved  - RHC 3/1 revealed surprisingly well-compensated hemodynamics on 4 vasopressors/inotropes. CO/CI 6.4/3.8, RA 11, PCW 17, PAPi 2.45  - Milrinone stopped 3/1 w/ low SVR (650)  - She is now off vasopressin and on epinephrine 2. She is on midodrine 15 mg tid.  There is a disconnect between co-ox (consistently low) and cardiac index which is 2.9 today.  - Continue to slowly wean the support she is on, will go by thermo cardiac index for now.  Epinephrine down to 2, continue slow wean.  If severe RV failure prevents epinephrine wean, we will not have further options and will need to start thinking about palliative care.  - CVP 9 range today.  With severe RV failure, aim for around 10.  Run CVVH to keep I/Os even.    2. PAH with cor-pulmonale, end-stage - she clearly has mixed PH (predominantly WHO Groups II-III but perhaps  contributions from I & IV) - Hold macitentan. Given her hemodynamics  doubt she will have much to gain from further selective pulmonary artery vasodilators. PVR not elevated.    3. Severe 3vCAD s/p CABG x3 with closure PFO + TV ring on 12/29/2021 - no current s/s angina - continue ASA/Statin   4. ESRD - currently on CVVHD, CVP 9 with severe RV failure.  Trying to keep I/Os even today.    5. SVT/possible PAF - continue IV amio while on inotropes/pressors - plts recovered. HIT negative.  - Switch from Bival back to heparin 3/2   6. H/O PE - 11/2020 VQ : unchanged left lower lobe apical segment PE - VQ 01/02/22 low prob - Continue heparin gtt    7. H/O MV Ring 2016 - stable on echo   8. Anemia  - Hgb down, 8.5>>7.8>>7.7>>8.1 >>8.1 - transfuse for hgb < 7.0  - iron stores normal   9. ID - morganella morganii bacteremia: completed abx. BCx 2/18 cleared  - Now with low grade fever 100, rising WBCs and PCT (1.05).  Will treat empirically with vancomycin/cefepime x 7 days.   10. Severe TR despite TV repair  CRITICAL CARE Performed by: Loralie Champagne  Total critical care time: 40 minutes  Critical care time was exclusive of separately billable procedures and treating other patients.  Critical care was necessary to treat or prevent imminent or life-threatening deterioration.  Critical care was time spent personally by me (independent of midlevel providers or residents) on the following activities: development of treatment plan with patient and/or surrogate as well as nursing, discussions with consultants, evaluation of patient's response to treatment, examination of patient, obtaining history from patient or surrogate, ordering and performing treatments and interventions, ordering and review of laboratory studies, ordering and review of radiographic studies, pulse oximetry and re-evaluation of patient's condition.  Loralie Champagne, MD  9:23 AM

## 2022-01-29 NOTE — Plan of Care (Signed)
Stable overnight. Alert,  ? ?NSR 80s on amio gtt. SBP goal > 100, titrated epi and adjusted CRRT pull rate to achieve. Midodrine q 6 hr.  CO/CI stable. Heparin sub-therapeutic, awaiting order from pharmacy.  ? ?CRRT continues without complication, filter changed d/t expiring this am. Aiming for even, approx + 450 ml/24 hr.  ? ?Phos 1.7 this am, per CCM MD will have renal address.  ? ?Tmax 100.2 but pt likes Bair Hugger - temp on [employer] and CRRT return line decreased with decrease of temp. WBC continue to incr 21.4.  ? ?Nocturnal tube feeds, CBG steadily increased during shift, insulin being adjusted by team. Poor appetite but no nausea/emesis this shift. Able to tolerate nectar thick juice.  ? ?Problem: Clinical Measurements: ?Goal: Ability to maintain clinical measurements within normal limits will improve ?Outcome: Progressing ?Goal: Will remain free from infection ?Outcome: Progressing ?Goal: Respiratory complications will improve ?Outcome: Progressing ?Goal: Cardiovascular complication will be avoided ?Outcome: Progressing ?  ?Problem: Nutrition: ?Goal: Adequate nutrition will be maintained ?Outcome: Progressing ?  ?Problem: Coping: ?Goal: Level of anxiety will decrease ?Outcome: Progressing ?  ?

## 2022-01-30 ENCOUNTER — Inpatient Hospital Stay (HOSPITAL_COMMUNITY): Payer: Medicare Other

## 2022-01-30 DIAGNOSIS — R579 Shock, unspecified: Secondary | ICD-10-CM | POA: Diagnosis not present

## 2022-01-30 DIAGNOSIS — J9601 Acute respiratory failure with hypoxia: Secondary | ICD-10-CM | POA: Diagnosis not present

## 2022-01-30 LAB — CBC WITH DIFFERENTIAL/PLATELET
Abs Immature Granulocytes: 0.12 10*3/uL — ABNORMAL HIGH (ref 0.00–0.07)
Basophils Absolute: 0.1 10*3/uL (ref 0.0–0.1)
Basophils Relative: 1 %
Eosinophils Absolute: 2.3 10*3/uL — ABNORMAL HIGH (ref 0.0–0.5)
Eosinophils Relative: 12 %
HCT: 26.7 % — ABNORMAL LOW (ref 36.0–46.0)
Hemoglobin: 8.2 g/dL — ABNORMAL LOW (ref 12.0–15.0)
Immature Granulocytes: 1 %
Lymphocytes Relative: 8 %
Lymphs Abs: 1.6 10*3/uL (ref 0.7–4.0)
MCH: 31.2 pg (ref 26.0–34.0)
MCHC: 30.7 g/dL (ref 30.0–36.0)
MCV: 101.5 fL — ABNORMAL HIGH (ref 80.0–100.0)
Monocytes Absolute: 1.2 10*3/uL — ABNORMAL HIGH (ref 0.1–1.0)
Monocytes Relative: 6 %
Neutro Abs: 14.4 10*3/uL — ABNORMAL HIGH (ref 1.7–7.7)
Neutrophils Relative %: 72 %
Platelets: 160 10*3/uL (ref 150–400)
RBC: 2.63 MIL/uL — ABNORMAL LOW (ref 3.87–5.11)
RDW: 24.4 % — ABNORMAL HIGH (ref 11.5–15.5)
Smear Review: NORMAL
WBC: 19.8 10*3/uL — ABNORMAL HIGH (ref 4.0–10.5)
nRBC: 2.2 % — ABNORMAL HIGH (ref 0.0–0.2)

## 2022-01-30 LAB — GLUCOSE, CAPILLARY
Glucose-Capillary: 112 mg/dL — ABNORMAL HIGH (ref 70–99)
Glucose-Capillary: 130 mg/dL — ABNORMAL HIGH (ref 70–99)
Glucose-Capillary: 148 mg/dL — ABNORMAL HIGH (ref 70–99)
Glucose-Capillary: 152 mg/dL — ABNORMAL HIGH (ref 70–99)
Glucose-Capillary: 161 mg/dL — ABNORMAL HIGH (ref 70–99)
Glucose-Capillary: 169 mg/dL — ABNORMAL HIGH (ref 70–99)
Glucose-Capillary: 35 mg/dL — CL (ref 70–99)
Glucose-Capillary: 81 mg/dL (ref 70–99)
Glucose-Capillary: 87 mg/dL (ref 70–99)

## 2022-01-30 LAB — RENAL FUNCTION PANEL
Albumin: 2.3 g/dL — ABNORMAL LOW (ref 3.5–5.0)
Albumin: 2.3 g/dL — ABNORMAL LOW (ref 3.5–5.0)
Anion gap: 9 (ref 5–15)
Anion gap: 7 (ref 5–15)
BUN: 11 mg/dL (ref 8–23)
BUN: 11 mg/dL (ref 8–23)
CO2: 22 mmol/L (ref 22–32)
CO2: 25 mmol/L (ref 22–32)
Calcium: 9.4 mg/dL (ref 8.9–10.3)
Calcium: 9.5 mg/dL (ref 8.9–10.3)
Chloride: 104 mmol/L (ref 98–111)
Chloride: 104 mmol/L (ref 98–111)
Creatinine, Ser: 0.87 mg/dL (ref 0.44–1.00)
Creatinine, Ser: 0.95 mg/dL (ref 0.44–1.00)
GFR, Estimated: >60 mL/min (ref >60)
GFR, Estimated: >60 mL/min (ref >60)
Glucose, Bld: 131 mg/dL — ABNORMAL HIGH (ref 70–99)
Glucose, Bld: 95 mg/dL (ref 70–99)
Phosphorus: 2.3 mg/dL — ABNORMAL LOW (ref 2.5–4.6)
Phosphorus: 2 mg/dL — ABNORMAL LOW (ref 2.5–4.6)
Potassium: 4.2 mmol/L (ref 3.5–5.1)
Potassium: 4 mmol/L (ref 3.5–5.1)
Sodium: 135 mmol/L (ref 135–145)
Sodium: 136 mmol/L (ref 135–145)

## 2022-01-30 LAB — POCT I-STAT 7, (LYTES, BLD GAS, ICA,H+H)
Acid-Base Excess: 2 mmol/L (ref 0.0–2.0)
Bicarbonate: 27 mmol/L (ref 20.0–28.0)
Calcium, Ion: 1.34 mmol/L (ref 1.15–1.40)
HCT: 28 % — ABNORMAL LOW (ref 36.0–46.0)
Hemoglobin: 9.5 g/dL — ABNORMAL LOW (ref 12.0–15.0)
O2 Saturation: 99 %
Patient temperature: 37.1
Potassium: 4.3 mmol/L (ref 3.5–5.1)
Sodium: 137 mmol/L (ref 135–145)
TCO2: 28 mmol/L (ref 22–32)
pCO2 arterial: 43.4 mmHg (ref 32–48)
pH, Arterial: 7.402 (ref 7.35–7.45)
pO2, Arterial: 120 mmHg — ABNORMAL HIGH (ref 83–108)

## 2022-01-30 LAB — HEPARIN LEVEL (UNFRACTIONATED): Heparin Unfractionated: 0.25 IU/mL — ABNORMAL LOW (ref 0.30–0.70)

## 2022-01-30 LAB — PATHOLOGIST SMEAR REVIEW

## 2022-01-30 LAB — VITAMIN B6: Vitamin B6: 6.9 ug/L (ref 3.4–65.2)

## 2022-01-30 LAB — MAGNESIUM: Magnesium: 2.4 mg/dL (ref 1.7–2.4)

## 2022-01-30 MED ORDER — AMIODARONE HCL 200 MG PO TABS
200.0000 mg | ORAL_TABLET | Freq: Two times a day (BID) | ORAL | Status: DC
  Administered 2022-01-30 – 2022-02-04 (×11): 200 mg via ORAL
  Filled 2022-01-30 (×11): qty 1

## 2022-01-30 MED ORDER — INSULIN DETEMIR 100 UNIT/ML ~~LOC~~ SOLN
15.0000 [IU] | Freq: Two times a day (BID) | SUBCUTANEOUS | Status: DC
  Filled 2022-01-30: qty 0.15

## 2022-01-30 MED ORDER — INSULIN DETEMIR 100 UNIT/ML ~~LOC~~ SOLN
15.0000 [IU] | Freq: Two times a day (BID) | SUBCUTANEOUS | Status: DC
  Administered 2022-01-30 – 2022-01-31 (×3): 15 [IU] via SUBCUTANEOUS
  Filled 2022-01-30 (×5): qty 0.15

## 2022-01-30 MED ORDER — DEXTROSE 50 % IV SOLN
INTRAVENOUS | Status: AC
  Administered 2022-01-30: 50 mL
  Filled 2022-01-30: qty 50

## 2022-01-30 MED ORDER — DEXTROSE 50 % IV SOLN
INTRAVENOUS | Status: AC
  Filled 2022-01-30: qty 50

## 2022-01-30 NOTE — Plan of Care (Signed)
Stable overnight.  ? ?Alert, more interactive, occasional complaints of generalized ache and R breast tenderness, received tramadol 100 mg and oxycodone 10 mg per tube with good results.  ? ?NSR 70-80s, on amio gtt. Epi slowly weaned off overnight with stable BP and CI. Warm and dry. RUE remains swollen (infiltrated AVF), elevated on pillows. Heparin gtt, trending heparin levels, pharmacy titrating, no bleeding.  ? ?CRRT continues without complications, tolerated pulling for goal of net even, achieved -266ml/24hr ? ?Afebrile, c/o being cold, Bair Hugger/CRRT return line heated. WBC down from 21.4 to 19.8, empiric abx.   ? ?Cortrak, nocturnal tube feeds, no nausea, poor appetite, tolerating nectar thick.  Blood sugars better controlled. Diarrhea, flexiseal, occasional complaints of sore stomach but declines pain meds.  ? ? ? ?Problem: Clinical Measurements: ?Goal: Ability to maintain clinical measurements within normal limits will improve ?Outcome: Progressing ?Goal: Will remain free from infection ?Outcome: Progressing ?Goal: Cardiovascular complication will be avoided ?Outcome: Progressing ?  ?Problem: Coping: ?Goal: Level of anxiety will decrease ?Outcome: Progressing ?  ?

## 2022-01-30 NOTE — Progress Notes (Signed)
Inpatient Rehab Admissions Coordinator:  ? ?Per therapy recommendations, patient was screened for CIR candidacy by Clemens Catholic, MS, CCC-SLP ?Marland Kitchen At this time, Pt. is not tolerating OOB and I do not think she is able to tolerate the intensity of CIR.   Pt. may have potential to progress to becoming a potential CIR candidate, so CIR admissions team will follow and monitor for progress and participation with therapies and place consult order if Pt. appears to be an appropriate candidate. Please contact me with any questions.  ? ?Clemens Catholic, MS, CCC-SLP ?Rehab Admissions Coordinator  ?[phone] (celll) ?[phone] (office) ? ?

## 2022-01-30 NOTE — Progress Notes (Addendum)
Advanced Heart Failure Rounding Note  PCP-Cardiologist: [name], MD   Subjective:    3/1: RHC with swan placement (on NE 2, EPi 3, Milrinone 0.25 and VP 0.02) w/ surprisingly well-compensated hemodynamics (see data below). Milrinone discontinued given low SVR 3/5: Empiric vancomycin/cefepime started for fever, temp 100, rising WBCs and PCT (1.05)  Currently off pressors, Epi weaned off ~3 am this morning. CI 2.7 but PAPi lower, 1.5 today. BP ok w/ midodrine, SBPs mid 90s.   WBC trending down, 21>>19K. Afebrile.   On CVVH, running even.   Awake and alert, but looks fatigued. Says she feels "decent". No respiratory difficulty. Wt is up 9 lb from lowest charted hospital wt. CVP trending up, 13 today.    Swan #s today  CVP 13 PA 50/30 CI 2.7 PAPI 1.5 (much lower than yesterday, 2.7)  RHC 3/1 (on 4 vasopressors/inotropes)   Ao = 104/48 (63) RA = 11 RV = 35/10 PA = 53/26 (34) PCW = 17  Fick cardiac output/index = 6.4/3.8 Thermal CO/CI = 5.2/3.1 PVR (Fick) = 2.7 PVR (Therm) = 3.3 SVR (Fick) = 650 SVR (Therm) = 807 Ao sat = 92% PA sat = 58%, 57%  Objective:   Weight Range: 71.1 kg Body mass index is 31.13 kg/m.   Vital Signs:   Temp:  [96.3 F (35.7 C)-98.2 F (36.8 C)] 97.7 F (36.5 C) (03/06 0700) Pulse Rate:  [74-88] 76 (03/06 0700) Resp:  [15-34] 17 (03/06 0700) SpO2:  [82 %-100 %] 93 % (03/06 0700) Arterial Line BP: (87-107)/(35-49) 95/43 (03/06 0700) Weight:  [71.1 kg] 71.1 kg (03/06 0410) Last BM Date : 01/29/22  Weight change: Filed Weights   01/28/22 0345 01/29/22 0445 01/30/22 0410  Weight: 71 kg 70.6 kg 71.1 kg    Intake/Output:   Intake/Output Summary (Last 24 hours) at 01/30/2022 0845 Last data filed at 01/30/2022 0700 Gross per 24 hour  Intake 3361.44 ml  Output 3685 ml  Net -323.56 ml      Physical Exam    CVP 13  General:  fatigued appearing, obese F. No respiratory difficulty HEENT: normal Neck: supple. JVD 13 cm.  Carotids 2+ bilat; no bruits. No lymphadenopathy or thyromegaly appreciated. Cor: PMI nondisplaced. Regular rate & rhythm. + TR murmur  Lungs: clear anteriorly  Abdomen: soft, nontender, nondistended. No hepatosplenomegaly. No bruits or masses. Good bowel sounds. Extremities: no cyanosis, clubbing, rash, trace b/l LE edema Neuro: alert & oriented x 3, cranial nerves grossly intact. moves all 4 extremities w/o difficulty. Affect pleasant.   Telemetry   NSR 70s personally checked.   EKG    N/A   Labs    CBC Recent Labs    01/29/22 0345 01/30/22 0345  WBC 21.4* 19.8*  NEUTROABS 17.2* 14.4*  HGB 8.1* 8.2*  HCT 27.2* 26.7*  MCV 102.3* 101.5*  PLT 159 381   Basic Metabolic Panel Recent Labs    01/29/22 0345 01/29/22 1518 01/30/22 0345  NA 135 135 135  K 4.7 4.2 4.2  CL 102 102 104  CO2 $Re'22 23 22  'hec$ GLUCOSE 240* 148* 131*  BUN $Re'11 11 11  'nHq$ CREATININE 1.12* 0.97 0.87  CALCIUM 9.7 9.2 9.4  MG 2.4  --  2.4  PHOS 1.7* 3.8 2.3*   Liver Function Tests Recent Labs    01/29/22 1518 01/30/22 0345  ALBUMIN 2.3* 2.3*   No results for input(s): LIPASE, AMYLASE in the last 72 hours. Cardiac Enzymes No results for input(s): CKTOTAL, CKMB, CKMBINDEX, TROPONINI  in the last 72 hours.  BNP: BNP (last 3 results) Recent Labs    12/20/21 2246 01/01/22 0941  BNP 1,753.1* 1,432.4*    ProBNP (last 3 results) No results for input(s): PROBNP in the last 8760 hours.   D-Dimer No results for input(s): DDIMER in the last 72 hours. Hemoglobin A1C No results for input(s): HGBA1C in the last 72 hours. Fasting Lipid Panel No results for input(s): CHOL, HDL, LDLCALC, TRIG, CHOLHDL, LDLDIRECT in the last 72 hours. Thyroid Function Tests No results for input(s): TSH, T4TOTAL, T3FREE, THYROIDAB in the last 72 hours.  Invalid input(s): FREET3  Other results:   Imaging    No results found.   Medications:     Scheduled Medications:  amiodarone  200 mg Oral BID   aspirin EC   325 mg Oral Daily   Or   aspirin  324 mg Per Tube Daily   chlorhexidine gluconate (MEDLINE KIT)  15 mL Mouth Rinse BID   Chlorhexidine Gluconate Cloth  6 each Topical Daily   darbepoetin (ARANESP) injection - NON-DIALYSIS  100 mcg Subcutaneous Q Sat-1800   doxercalciferol  9 mcg Intravenous Q M,W,F-HD   ezetimibe  10 mg Oral Daily   feeding supplement (PROSource TF)  45 mL Per Tube BID   feeding supplement (VITAL 1.5 CAL)  900 mL Per Tube Q24H   insulin aspart  0-20 Units Subcutaneous Q4H   insulin detemir  20 Units Subcutaneous BID   latanoprost  1 drop Both Eyes QHS   midodrine  15 mg Oral Q6H   multivitamin  1 tablet Per Tube QHS   pantoprazole sodium  40 mg Per Tube Daily   rosuvastatin  10 mg Per Tube QHS   sodium chloride flush  10-40 mL Intracatheter Q12H   sodium chloride flush  3 mL Intravenous Q12H    Infusions:   prismasol BGK 4/2.5 500 mL/hr at 01/30/22 0213    prismasol BGK 4/2.5 500 mL/hr at 01/30/22 2376   sodium chloride     sodium chloride 10 mL/hr at 01/30/22 0700   sodium chloride Stopped (01/26/22 0735)   sodium chloride 10 mL/hr at 01/30/22 0700   amiodarone 30 mg/hr (01/30/22 0700)   ceFEPime (MAXIPIME) IV Stopped (01/29/22 2156)   epinephrine Stopped (01/30/22 0300)   heparin 1,200 Units/hr (01/30/22 0700)   prismasol BGK 4/2.5 1,400 mL/hr at 01/30/22 2831   vancomycin      PRN Medications: sodium chloride, sodium chloride, alteplase, bisacodyl **OR** bisacodyl, docusate, food thickener, Gerhardt's butt cream, heparin, hydrOXYzine, lip balm, ondansetron (ZOFRAN) IV, oxyCODONE, sodium chloride, sodium chloride flush, sodium chloride flush, traMADol    Patient Profile   66 y/o female with obesity, T2DM, previous PE, ESRD and mitral regurgitation, 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 Subsequently followed at [facility] for mixed [place] on macitentan and sildenafil. Admitted 2/5 for CP. LHC showed severe 3v CAD with CTO of RCA & LCX and critical  ostial LAD lesion. Initial echo, EF 40-45%, no MR, mod MS, RV mildly reduced w/ severe TR. Underwent CABG w/ PFO closure and tricuspid valve repair/valvuloplasty w/ ring 01/16/2022. Complicated post CABG course with RV failure, GN bacteremia and persistent hypotension and inability to wean off pressors. Nephrology following for CVVH.  Assessment/Plan   1. Post-cardiotomy cardiogenic shock - likely due predominantly to end-stage RV failure. (Required ECMO in 2016 post MVR). Echo 01/15/22 with EF 50-55%, D-shaped septum with severe RV dysfunction and enlargement, s/p MV repair, s/p TV repair but  still with severe TR.  - Post op platelets down to 30. Switched to Bival. HIT negative. Plts improved  - RHC 3/1 revealed surprisingly well-compensated hemodynamics on 4 vasopressors/inotropes. CO/CI 6.4/3.8, RA 11, PCW 17, PAPi 2.45  - Milrinone stopped 3/1 w/ low SVR (650)  - Now off IV pressor support, Epi weaned off ~3 am. CI 2.66 but PAPi lower, 1.5. CVP trending up at 13.  - CVVHD currently running even, would aim for slight negative fluid balance today.  With severe RV failure, aim for CVP ~10 - Continue to follow hemodynamics off swan today, if she continues to struggle w/ worsening RV failure off Epi, will need to consider switching to comfort care    2. PAH with cor-pulmonale, end-stage - she clearly has mixed PH (predominantly WHO Groups II-III but perhaps contributions from I & IV) - Hold macitentan. Given her hemodynamics  doubt she will have much to gain from further selective pulmonary artery vasodilators. PVR not elevated.   - BP will not tolerate sildenafil   3. Severe 3vCAD s/p CABG x3 with closure PFO + TV ring on 01/19/2022 - no current s/s angina - continue ASA/Statin   4. ESRD - currently on CVVHD. CVP trending up, ~13-14 - w/ severe RV failure, would aim to keep CVP ~10, aim for net negative fluid balance today    5. SVT/possible PAF - maintaining NSR - now off pressors, switch back  to PO amio 200 mg bid  - plts recovered. HIT negative.  - Switched from Bival back to heparin 3/2   6. H/O PE - 11/2020 VQ : unchanged left lower lobe apical segment PE - VQ 01/02/22 low prob - Continue heparin gtt    7. H/O MV Ring 2016 - stable on echo   8. Anemia  - Hgb down, 8.5>>7.8>>7.7>>8.1 >>8.1>>8.2 - transfuse for hgb < 7.0  - iron stores normal   9. ID - morganella morganii bacteremia: completed abx. BCx 2/18 cleared  - abx reinitiated 3/5 for fever, worsening leukocytosis and PCT 1.05  - AF today, WBC down trending  - treating w/ empiric vancomycin/cefepime x 7 days (end-date 3/12)   10. Severe TR despite TV repair  Robbie Lis, PA-C  8:45 AM  Patient seen with PA, agree with the above note.   She is now off pressors, continues on midodrine 15 mg tid with stable MAP.    CVP 15-16 currently. Have been running CVVH even.   General: NAD Neck: JVP 14+, no thyromegaly or thyroid nodule.  Lungs: Decreased at bases.  CV: Nondisplaced PMI.  Heart regular S1/S2, no S3/S4, 2/6 HSM LLSB.  No peripheral edema.   Abdomen: Soft, nontender, no hepatosplenomegaly, no distention.  Skin: Intact without lesions or rashes.  Neurologic: Alert and oriented x 3.  Psych: Normal affect. Extremities: No clubbing or cyanosis.  HEENT: Normal.   Continue midodrine 15 mg tid.  Now developing worsening volume overload.  Will try to pull 50-100 cc/hr net negative UF via CVVH today. Will aim for CVP around 10-12.  Aim to keep her off pressors if possible.    7 days empiric broad spectrum abx continue.   CRITICAL CARE Performed by: Marca Ancona  Total critical care time: 35 minutes  Critical care time was exclusive of separately billable procedures and treating other patients.  Critical care was necessary to treat or prevent imminent or life-threatening deterioration.  Critical care was time spent personally by me on the following activities: development of treatment plan  with patient and/or surrogate as well as nursing, discussions with consultants, evaluation of patient's response to treatment, examination of patient, obtaining history from patient or surrogate, ordering and performing treatments and interventions, ordering and review of laboratory studies, ordering and review of radiographic studies, pulse oximetry and re-evaluation of patient's condition.  Loralie Champagne 01/30/2022 11:45 AM

## 2022-01-30 NOTE — Progress Notes (Signed)
Speech Language Pathology Treatment: Dysphagia  ?Patient Details ?Name: Jody Nguyen ?MRN: [id number] ?DOB: 07/15/1956 ?Today's Date: 01/30/2022 ?Time: 4270-6237 ?SLP Time Calculation (min) (ACUTE ONLY): 14 min ? ?Assessment / Plan / Recommendation ?Clinical Impression ? Jody Nguyen's overall PO intake still remains less than optimal; however she is swallowing well and nectar thick liquids are tolerated with limited concern for aspiration.  During today's session she was agreeable to trying different thickened liquids but had limited sips of each - not happy with the flavors. Given weak cough/poor clearance of thin liquid penetration from vocal folds per MBS, it is better to continue nectar thick liquids for now. D/W RN. SLP will continue to follow.   ?  ?HPI HPI: 66 yo F presented to [facility] 2/4 with chest pain. ECHO revealed severe RV failure, felt to be  consistent with chronic process such as severe pulmonary HTN. 2/7 cardiac cath - three vessel dz;  2/10 CABGx3, TVR; 2/13 chest tube removed; ETT 2/10-2/16; CVVHD.  PMH ESRD on HD, non-obstructive CAD, suspected pulmonary hypertension, Hx PE on eliquis. Passed [facility] on 2/16; RN had concerns for potential dysphagia and requested SLP swallow consult. ?  ?   ?SLP Plan ? Continue with current plan of care ? ?  ?  ?Recommendations for follow up therapy are one component of a multi-disciplinary discharge planning process, led by the attending physician.  Recommendations may be updated based on patient status, additional functional criteria and insurance authorization. ?  ? ?Recommendations  ?Diet recommendations: Dysphagia 3 (mechanical soft);Nectar-thick liquid ?Liquids provided via: Cup;Straw ?Medication Administration: Whole meds with puree ?Supervision: Staff to assist with self feeding ?Compensations: Slow rate;Small sips/bites;Minimize environmental distractions ?Postural Changes and/or Swallow Maneuvers:  (as upright as possible with femoral cath)  ?    ?    ?   ? ? ? ? Oral Care Recommendations: Oral care BID ?Follow Up Recommendations: Acute inpatient rehab (3hours/day) ?Assistance recommended at discharge: Frequent or constant Supervision/Assistance ?SLP Visit Diagnosis: Dysphagia, pharyngeal phase (R13.13) ?Plan: Continue with current plan of care ? ? ? ? ?  ?  ? ?Jody Poudrier L. Ira Busbin, MA CCC/SLP ?Acute Rehabilitation Services ?Office number [phone] ?Pager [phone] ? ?Jody Nguyen ? ?01/30/2022, 1:54 PM ?

## 2022-01-30 NOTE — Progress Notes (Signed)
Critical result CBG 40 appeared dilute on sample. Redrawn at 2008 resulted CBG 112 and was not dilute. Patient has no complaints and is AOx4. Nocturnal TF restarted at 1800, tolerating well so far.  ?

## 2022-01-30 NOTE — Progress Notes (Signed)
ANTICOAGULATION CONSULT NOTE ? ?Pharmacy Consult for bivalirudin>>heparin ?Indication: history of pulmonary embolus 2021 ? ?Allergies  ?Allergen Reactions  ? Minoxidil Other (See Comments)  ?  Pericardial effusion  ? Lipitor [Atorvastatin] Other (See Comments)  ?  MYALGIAS > "pain in legs" ?Tolerates rosuvastatin  ? Morphine And Related Itching  ? Ace Inhibitors Other (See Comments)  ?  REACTION: "not sure...think it made me drowsy all the time"  ? ? ?Patient Measurements: ?Height: 4' 11.5" (151.1 cm) ?Weight: 71.1 kg (156 lb 12 oz) ?IBW/kg (Calculated) : 44.35 ?Heparin DW: 70.8kg  ? ?Vital Signs: ?Temp: 97.3 ?F (36.3 ?C) (03/06 0400) ?Temp Source: Core (03/06 0400) ?Pulse Rate: 75 (03/06 0400) ? ?Labs: ?Recent Labs  ?  01/28/22 ?9211 01/28/22 ?1610 01/29/22 ?0345 01/29/22 ?1518 01/30/22 ?0345  ?HGB 8.1*  --  8.1*  --   --   ?HCT 26.6*  --  27.2*  --   --   ?PLT 152  --  159  --   --   ?HEPARINUNFRC 0.55  --  0.19*  --  0.25*  ?CREATININE 1.06*   < > 1.12* 0.97 0.87  ? < > = values in this interval not displayed.  ? ? ? ?Estimated Creatinine Clearance: 55.3 mL/min (by C-G formula based on SCr of 0.87 mg/dL). ? ? ?Assessment: ?66 yo female on chronic apixaban for hx of PE 2021. Apixaban has been held for surgery.  Pharmacy asked to resume anticoagulation post op 2/15 with bivalirudin.  HIT antibody and SRA both negative.  Platelet count now recovered 50>208>150-160s.  ? ?Patient on heparin (s/p bivalirudin) as HIT work-up negative. ?Heparin level 0.25, morning cbc in process. No bleeding noted. Heparin levels have been very labile, will adjust slightly given low goal.  ? ?Goal of Therapy:  ?Heparin level 0.3-0.5 - target lower end given low platelet count/risk of post-operative bleeding ?Monitor platelets by anticoagulation protocol: Yes ?  ?Plan:  ?-Increase  Heparin drip back to 1200 units/hr ?-HL and CBC daily while on heparin ?-Monitor for s/x of bleeding ? ?Erin Hearing PharmD., BCPS ?Clinical  Pharmacist ?01/30/2022 4:28 AM ? ?

## 2022-01-30 NOTE — Progress Notes (Signed)
EVENING ROUNDS NOTE : ? ?   ?[address] ?      [place] [address] ?            [phone]   ?              ?5 Days Post-Op ?Procedure(s) (LRB): ?RIGHT HEART CATH (N/A) ? ? ?Total Length of Stay:  LOS: 29 days  ?Events:   ?No events today ? ? ? ?BP (!) 75/53 Comment: following aline  Pulse 75   Temp (!) 96.6 ?F (35.9 ?C)   Resp (!) 24   Ht 4' 11.5" (1.511 m)   Wt 71.1 kg   SpO2 95%   BMI 31.13 kg/m?  ? ?PAP: (37-60)/(14-38) 46/23 ?CVP:  [11 mmHg-16 mmHg] 15 mmHg ?CO:  [3.4 L/min-4.7 L/min] 3.5 L/min ?CI:  [2.1 L/min/m2-2.8 L/min/m2] 2.1 L/min/m2 ? ?  ? ?  prismasol BGK 4/2.5 500 mL/hr at 01/30/22 1231  ?  prismasol BGK 4/2.5 500 mL/hr at 01/30/22 1234  ? sodium chloride    ? sodium chloride 10 mL/hr at 01/30/22 0700  ? sodium chloride 10 mL/hr at 01/30/22 1830  ? sodium chloride 10 mL/hr at 01/30/22 1829  ? ceFEPime (MAXIPIME) IV 2 g (01/30/22 1017)  ? epinephrine Stopped (01/30/22 0300)  ? heparin 1,200 Units/hr (01/30/22 1740)  ? prismasol BGK 4/2.5 1,200 mL/hr at 01/30/22 1819  ? vancomycin 750 mg (01/30/22 1237)  ? ? ?I/O last 3 completed shifts: ?In: 5627.5 [P.O.:600; I.V.:1736.4; Other:250; NG/GT:2281.3; IV Piggyback:759.9] ?Out: 4174 [YCXKG:8185; UDJSH:702] ? ? ?CBC Latest Ref Rng & Units 01/30/2022 01/29/2022 01/28/2022  ?WBC 4.0 - 10.5 K/uL 19.8(H) 21.4(H) 17.8(H)  ?Hemoglobin 12.0 - 15.0 g/dL 8.2(L) 8.1(L) 8.1(L)  ?Hematocrit 36.0 - 46.0 % 26.7(L) 27.2(L) 26.6(L)  ?Platelets 150 - 400 K/uL 160 159 152  ? ? ?BMP Latest Ref Rng & Units 01/30/2022 01/29/2022 01/29/2022  ?Glucose 70 - 99 mg/dL 131(H) 148(H) 240(H)  ?BUN 8 - 23 mg/dL 11 11 11   ?Creatinine 0.44 - 1.00 mg/dL 0.87 0.97 1.12(H)  ?BUN/Creat Ratio 6 - 22 (calc) - - -  ?Sodium 135 - 145 mmol/L 135 135 135  ?Potassium 3.5 - 5.1 mmol/L 4.2 4.2 4.7  ?Chloride 98 - 111 mmol/L 104 102 102  ?CO2 22 - 32 mmol/L 22 23 22   ?Calcium 8.9 - 10.3 mg/dL 9.4 9.2 9.7  ? ? ?ABG ?   ?Component Value Date/Time  ? PHART 7.425 01/20/2022 1944  ? PCO2ART  35.9 01/20/2022 1944  ? PO2ART 80 (L) 01/20/2022 1944  ? HCO3 26.2 02/23/2022 0821  ? TCO2 27 02/21/2022 0821  ? ACIDBASEDEF 1.0 01/20/2022 1944  ? O2SAT 43.7 01/28/2022 0447  ? ? ? ? ? ?Melodie Bouillon, MD ?01/30/2022 6:51 PM ? ? ?

## 2022-01-30 NOTE — Progress Notes (Signed)
?[facility] ?Progress Note  ? ?Assessment/ Plan:   ?OP HD: MWF [facility] ?3:15h   450/A1.5   75.5kg   2K/2Ca bath  P2   AVF  Hep  2500 ?- Hectoral 62mg IV q HD ?- Sensipar 969mPO q HD ?- Mircera 15041mIV q 2 weeks (ordered, not yet given. Last Mircera 100 on 12/07/21) ?  ?  ?  ?Assessment/Plan:  ? CAD s/p CABG x3, TR sp TVR:  per CT surgery  ? ESRD:  normally MWF schedule.  Has been on CRRT with a small holiday which she did not tolerate since 2/11. CVP ^ to 13, will ^UF to net negative 50 cc/hr, goal CVP ~ 10.  ?Shock - cardiogenic and septic. Has RV failure- RHC 02/08/2022--> more well-compensated hemodynamics. Weaned off epi early am today. Cont midodrine. Off pressors and inotropes.   ?Morganella bacteremia -  s/p Zosyn ? Anemia CKD: Sq darbe 100ug weekly on Saturday. 1st 2/25. ? Metabolic bone disease: resume home binders and sensipar when taking PO. Cont IV vdra.  ? Infiltrated AVF - AVF infiltrated on 2/7. Post-op AVF is nonfunctional, VVS aware. [facility].  ?[facility]  [facility] ? T2DM-per primary service ? Hx of MV replacement - in 2016 ?Abd pain: still having stools- if continues will consider AXR, defer to primary ?Dispo: ICU ? ?RobKelly SplinterD ?01/30/2022, 1:30 PM ? ? ? ? ?Subjective:   ? ?Seen in room, no c/o  ? ?Objective:   ?BP (!) 75/53 Comment: following aline  Pulse 75   Temp (!) 96.6 ?F (35.9 ?C)   Resp 20   Ht 4' 11.5" (1.511 m)   Wt 71.1 kg   SpO2 98%   BMI 31.13 kg/m?  ? ?Physical Exam: ?GenUUV:OZDGUppears uncomfortable ?CVS: tachy ?Resp: coarse ?Abd: soft ?Ext: 1+ anasarca, worse in R UE , no other edema ?ACCESS: nontunneled HD cath R fem ? ?Labs: ?BMET ?Recent Labs  ?Lab 01/27/22 ?0310 01/27/22 ?1619 01/28/22 ?[id number]/04/23 ?1610 01/29/22 ?0345 01/29/22 ?1518 01/30/22 ?0345  ?NA 135 137 134* 137 135 135 135  ?K 4.2 4.5 4.8 4.3 4.7 4.2 4.2  ?CL 102 102 100 103 102 102 104  ?CO2 25 27 21* _0 ?GLUCOSE 72 101* 200*  130* 240* 148* 131*  ?BUN _1 ?CREATININE 1.12* 1.06* 1.06* 1.01* 1.12* 0.97 0.87  ?CALCIUM 9.5 9.5 9.5 9.8 9.7 9.2 9.4  ?PHOS 2.0* 2.7 2.5 2.1* 1.7* 3.8 2.3*  ? ? ?CBC ?Recent Labs  ?Lab 01/27/22 ?0310 01/28/22 ?[id number]/05/23 ?0345 01/30/22 ?0345  ?WBC 13.3* 17.8* 21.4* 19.8*  ?NEUTROABS 9.8* 15.0* 17.2* 14.4*  ?HGB 7.7* 8.1* 8.1* 8.2*  ?HCT 24.9* 26.6* 27.2* 26.7*  ?MCV 99.2 101.9* 102.3* 101.5*  ?PLT 141* 152 159 160  ? ? ? ?  ?Medications:   ? ? amiodarone  200 mg Oral BID  ? aspirin EC  325 mg Oral Daily  ? Or  ? aspirin  324 mg Per Tube Daily  ? chlorhexidine gluconate (MEDLINE KIT)  15 mL Mouth Rinse BID  ? Chlorhexidine Gluconate Cloth  6 each Topical Daily  ? darbepoetin (ARANESP) injection - NON-DIALYSIS  100 mcg Subcutaneous Q Sat-1800  ? doxercalciferol  9 mcg Intravenous Q M,W,F-HD  ? ezetimibe  10 mg Oral Daily  ? feeding supplement (PROSource TF)  45 mL Per Tube BID  ? feeding supplement (VITAL 1.5 CAL)  900 mL Per Tube Q24H  ? insulin aspart  0-20 Units Subcutaneous Q4H  ? insulin detemir  20 Units Subcutaneous BID  ? latanoprost  1 drop Both Eyes QHS  ? midodrine  15 mg Oral Q6H  ? multivitamin  1 tablet Per Tube QHS  ? pantoprazole sodium  40 mg Per Tube Daily  ? rosuvastatin  10 mg Per Tube QHS  ? sodium chloride flush  10-40 mL Intracatheter Q12H  ? sodium chloride flush  3 mL Intravenous Q12H  ? ? ? ? ?

## 2022-01-30 NOTE — Progress Notes (Signed)
Physical Therapy Treatment ?Patient Details ?Name: Jody Nguyen ?MRN: [id number] ?DOB: 1956-09-20 ?Today's Date: 01/30/2022 ? ? ?History of Present Illness 66 yo female presenting 2/5 with recurrent chest pain. Pt now s/p CABG x3 and tricuspid valve annuloplasty on 2/10 requiring 6 units PRBCs, 2 units FFP, and 2 bags of platelets and resulting in vasoplegic shock with very high vasopressor requirements. Started on CRRT 2/11, extubated 2/16. S/p right heart cath 3/1 and Swan-ganz placed. PMH includes: anemia, CHF, CKD IV on HD MWF, CVA, DM II, HLD, HTN, pulmonary HTN. ? ?  ?PT Comments  ? ? Pt alert, follows one step commands with intermittent repetition. Pt tolerated tilt to ~50 degrees x 15 minutes to promote BLE weightbearing and upright positioning. May benefit from pillow underneath knees next session to prevent hyperextension. Pt performed BUE/BLE strengthening exercises. Will continue to progress as tolerated. ?   ?Recommendations for follow up therapy are one component of a multi-disciplinary discharge planning process, led by the attending physician.  Recommendations may be updated based on patient status, additional functional criteria and insurance authorization. ? ?Follow Up Recommendations ? Acute inpatient rehab (3hours/day) ?  ?  ?Assistance Recommended at Discharge Frequent or constant Supervision/Assistance  ?Patient can return home with the following Two people to help with walking and/or transfers;Two people to help with bathing/dressing/bathroom;Assistance with cooking/housework;Help with stairs or ramp for entrance;Direct supervision/assist for medications management;Direct supervision/assist for financial management;Assist for transportation ?  ?Equipment Recommendations ? Other (comment) (TBA)  ?  ?Recommendations for Other Services   ? ? ?  ?Precautions / Restrictions Precautions ?Precautions: Fall;Sternal;Other (comment) ?Precaution Comments: bilateral femoral catheters, flexiseal, cortrack,  rectal tube, watch BP, CRRT, Swan-ganz femoral, s/p R heart cath 3/1 ?Restrictions ?Weight Bearing Restrictions: Yes ?Other Position/Activity Restrictions: sternal precautions  ?  ? ?Mobility ? Bed Mobility ?Overal bed mobility: Needs Assistance ?  ?  ?  ?  ?  ?  ?General bed mobility comments: Tilt to 50 degrees x 15 minutes ?  ? ?Transfers ?  ?  ?  ?  ?  ?  ?  ?  ?  ?  ?  ? ?Ambulation/Gait ?  ?  ?  ?  ?  ?  ?  ?  ? ? ?Stairs ?  ?  ?  ?  ?  ? ? ?Wheelchair Mobility ?  ? ?Modified Rankin (Stroke Patients Only) ?  ? ? ?  ?Balance   ?  ?  ?  ?  ?  ?  ?  ?  ?  ?  ?  ?  ?  ?  ?  ?  ?  ?  ?  ? ?  ?Cognition Arousal/Alertness: Awake/alert ?Behavior During Therapy: Flat affect ?Overall Cognitive Status: Impaired/Different from baseline ?Area of Impairment: Attention, Awareness, Memory, Problem solving ?  ?  ?  ?  ?  ?  ?  ?  ?  ?Current Attention Level: Sustained ?Memory: Decreased recall of precautions ?Following Commands: Follows one step commands consistently ?Safety/Judgement: Decreased awareness of deficits ?Awareness: Emergent ?Problem Solving: Slow processing ?General Comments: requires cues to continue task ?  ?  ? ?  ?Exercises General Exercises - Upper Extremity ?Shoulder Flexion: AAROM, Both, 10 reps, Theraband ?Theraband Level (Shoulder Flexion): Level 1 (Yellow) (LUE) ?Shoulder Horizontal ABduction: AROM, AAROM, Both, 10 reps, Theraband ?Theraband Level (Shoulder Horizontal Abduction): Level 1 (Yellow) (LUE) ?Elbow Flexion: AROM, AAROM, Both, 10 reps, Theraband ?Theraband Level (Elbow Flexion): Level 1 (Yellow) (LUE) ?General Exercises - Lower Extremity ?  Quad Sets: Both, 10 reps, Other (comment) (standing in tilt) ?Other Exercises ?Other Exercises: Standing in tilt bed: cervical rotation to R/L x 5, scapular retractions x 10, shoulder shrugs x 10 ? ?  ?General Comments  VSS ?  ?  ? ?Pertinent Vitals/Pain Pain Assessment ?Pain Assessment: No/denies pain  ? ? ?Home Living   ?  ?  ?  ?  ?  ?  ?  ?  ?  ?   ?   ?Prior Function    ?  ?  ?   ? ?PT Goals (current goals can now be found in the care plan section) Acute Rehab PT Goals ?Patient Stated Goal: did not state ?Progress towards PT goals: Progressing toward goals ? ?  ?Frequency ? ? ? Min 2X/week ? ? ? ?  ?PT Plan Current plan remains appropriate  ? ? ?Co-evaluation   ?  ?  ?  ?  ? ?  ?AM-PAC PT "6 Clicks" Mobility   ?Outcome Measure ? Help needed turning from your back to your side while in a flat bed without using bedrails?: Total ?Help needed moving from lying on your back to sitting on the side of a flat bed without using bedrails?: Total ?Help needed moving to and from a bed to a chair (including a wheelchair)?: Total ?Help needed standing up from a chair using your arms (e.g., wheelchair or bedside chair)?: Total ?Help needed to walk in hospital room?: Total ?Help needed climbing 3-5 steps with a railing? : Total ?6 Click Score: 6 ? ?  ?End of Session   ?Activity Tolerance: Patient tolerated treatment well ?Patient left: in bed;with call bell/phone within reach;with nursing/sitter in room ?Nurse Communication: Mobility status ?PT Visit Diagnosis: Unsteadiness on feet (R26.81);Other abnormalities of gait and mobility (R26.89);Muscle weakness (generalized) (M62.81);Difficulty in walking, not elsewhere classified (R26.2) ?  ? ? ?Time: 1275-1700 ?PT Time Calculation (min) (ACUTE ONLY): 31 min ? ?Charges:  $Therapeutic Exercise: 8-22 mins ?$Therapeutic Activity: 8-22 mins          ?          ? ?Wyona Almas, PT, DPT ?Acute Rehabilitation Services ?Pager [phone] ?Office [phone] ? ? ? ?[name] ?01/30/2022, 1:39 PM ? ?

## 2022-01-30 NOTE — Progress Notes (Signed)
? ?NAME:  Jody Nguyen, MRN:  [id number], DOB:  07-26-1956, LOS: 10 ?ADMISSION DATE:  01/15/2022, CONSULTATION DATE:  01/01/2022 ?REFERRING MD:  Marlowe Sax - TRH, CHIEF COMPLAINT:  SOB  ? ?History of Present Illness:  ?66 year old woman who presented to [facility] ED 2/4 with CP radiating to her arm. PMHx significant for  ESRD (on HD), non-obstructive CAD, suspected pulmonary hypertension, PE (on Eliquis). Recent ED presentation 2/1 for similar symptoms (troponin 100s). F/u with Cardiology as an outpatient 2/2 and CP was felt to be related to volume shift (occurring during dialysis).  ? ?On ED presentation 2/4, patient reported episodic chest pain, non-exertional and intermittent x 1 week with associated nausea and SOB. Non-specific EKG changes. Cardiology consulted, STEMI ruled out. Troponin elevated at 200. Placed on [place] for SpO2 80s. Admitted to [facility] for observation with concern for PE and started on heparin gtt. VQ with low probability for PE. Echo 2/19 demonstrated severe RV/RA enlargement, severely reduced RV function and septal bowing; felt to be consistent with chronic process such as severe pulmonary HTN.  ?  ?PCCM has been consulted in this setting. ? ?Pertinent Medical History:  ?ESRD ?PE ?Pulmonary HTN  ?CAD ?CVA ?HLD ?HTN ?S/p MV repair  ?Pericardial effusion r/t minoxidil  ?Diastolic HF ?DM2 ? ?Significant Hospital Events: ?Including procedures, antibiotic start and stop dates in addition to other pertinent events   ?2/4 ED presentation with recurring chest pain -- STEMI ruled out. Stable, mildly hypoxic and placed on [place] ?2/5 Admit to [facility] for obvs. Concern for PE, ECHO performed by cardiology reportedly shows RV failure, but felt more consistent with chronic process like severe pulmHTN. Awaiting VQ scan. Pulm consulted  ?2/7 Cardiac cath. 3 vessel dz. CABG rec ?2/10 CABG, TVR ?2/11 Continued vasoplegic shock with very high vasopressor requirements  ?2/12 Given methylene blue x 2 ?2/13 Chest tube removed ?2/14 JP  removed ?2/15 pacing wires removed ?2/16 milrinone stopped, extubated ?2/17 Zosyn for GNR bacteremia-> Morganella morganii ?2/18 Arterial line, central line, vascath all removed and replaced. ?2/19 Milrinone back on. Co-ox improved. ECHO Left ventricular ejection fraction, by estimation, is 50 to 55%. The left ventricle has low normal function. No regional WM abnormality. Right Ventricle: Ventricular septum is flattened in systole and diastole  suggesting RV pressure and volume overload. The right ventricular size is  severely enlarged. Right vetricular wall thickness was not well  visualized. Right ventricular systolic  function is severely reduced. There is moderately elevated pulmonary  ?artery systolic pressure ?2/20 still high pressor needs. CRRT stopped. Having intermittent bouts of tachycardia ?2/21 Hemodynamics better. Weaning Epi. Still on Norepi and inotropic support. Supported on BIPAP over night and tolerated well. On am rounds more lethargic w/ generalized pain everywhere. Co-ox > 70. Glucose poorly controlled. ?2/22 Escalating pressor requirements (NE, Epi, Vaso). Continues on milrinone. Afib with RVR overnight, now with persistent AF (on amio, bival). Co-ox 73%. Hgb 7.3. WBC uptrending (25 from 20), Cr uptrending, CRRT restarted. Tolerating BiPAP. ?2/23 Improving pressor needs. Stable Epi/Vaso, downtitrating NE. Milrinone 0.25. Persistent AF, rate controlled. Co-ox 65.7%. Ongoing CRRT. BiPAP QHS. ?2/24 Continued decreasing pressor needs, stable epi/vaso, downtitrating Levo.  Milrinone 0.25.  Intermittent A-fib, in and out of NSR, rates 90s.  Co-ox 76.1%.  Ongoing CRRT, did not utilize BiPAP overnight. FEES with SLP. ?2/25 confused.  Pressor requirements down some.  Got 1 unit of blood for hemoglobin of 7, Co. ox dropped. Vasopressin decreased to 0.03 units.  ?3/2 stopped epinephrine, Co. oximetry dropped quickly ?3/3 back  on epinephrine, with plan to eventually titrate vasopressin off and trial  dobutamine.  Currently on room air.  Co. oximetry's in the 40s, hypoglycemic requiring titration down on basal dose insulin after changing tube feeds to nocturnal ?3/6 off Epi since 3 am  ? ?Interim History / Subjective:  ?Off levo ?Off Epi since 3 am , BP stable ?Pain and mass right chest.  ?Net negative 3500 last 24 hours no UO ? ?Labs  Mag 2.4, Na 135, K 4.2, Creatinine 0.87, Phos 2.3, Albumin 2.3 ?HGB 8.2, WBC 19.8, Platelets 160,000, T max 99 ? ?Hemodynamic parameters for last 24 hours: ?PAP: (37-57)/(14-31) 47/25 ?CVP:  [4 mmHg-16 mmHg] 16 mmHg ?CO:  [3.3 L/min-4.9 L/min] 4.2 L/min ?CI:  [2 L/min/m2-2.9 L/min/m2] 2.6 L/min/m2 ? ?Objective:  ?Blood pressure (!) 75/53, pulse 76, temperature 97.7 ?F (36.5 ?C), resp. rate 17, height 4' 11.5" (1.511 m), weight 71.1 kg, SpO2 93 %. ?PAP: (37-57)/(14-31) 47/25 ?CVP:  [4 mmHg-16 mmHg] 16 mmHg ?CO:  [3.3 L/min-4.7 L/min] 4.2 L/min ?CI:  [2 L/min/m2-2.8 L/min/m2] 2.6 L/min/m2  ?   ? ?Intake/Output Summary (Last 24 hours) at 01/30/2022 0845 ?Last data filed at 01/30/2022 0700 ?Gross per 24 hour  ?Intake 3361.44 ml  ?Output 3685 ml  ?Net -323.56 ml  ? ?Filed Weights  ? 01/28/22 0345 01/29/22 0445 01/30/22 0410  ?Weight: 71 kg 70.6 kg 71.1 kg  ? ?Physical Examination: ?No distress, a bit more alert than yesterday, states she has some pain ?Moves all 4 ext to pain, weak ?Poor inspiratory effort, bilateral lungs clear, distant and diminished per bases ?Heart sounds regular, ext warm under warming blanker, R arm with 2 + edema , All extremities warm with brisk capillary refill ?Mild anasarca stable ?Right upper chest mass, tenser to touch ? ?Chemistries stable on BMP ?WBC slight decrease at 19.8, T Max 99 but on CVVH ?Vanc and Cefepime started 3/5  ? ? ? ?Assessment & Plan:  ?Acute decompensated R>L heart failure  ?with cardiogenic shock ?Mixed pulmonary HTN leading to above ?Atrial fibrillation on AC and amio ?Acute hypoxemic resp failure in setting of advanced multifactorial  pulmonary HTN ?ICU delirium ?H/O PE ?CAD post CABG x3, TVR, PFO closure 01/21/2022 ?ESRD on HD- thrombosed AVF ?Probable renal vasoplegia complicating shock picture ?Dysphagia ?Anemia of chronic renal failure ?DM2 with hyperglycemia currently on levemir + SSI ?Morganella bacteremia treated ?Anemia stable ? ?- T max of 99 on CRRT, WBC 19.8, continue  treat empirically with vanc+cefepime x 7 days ?- Increase levemir + 3/5   SSI>> CBG's better controlled ?- PRN phenergan ?- Off epi,  remains on midodrine 15 mg per tube Q 6 hours  ?- Amio to be started per tube today to get patient off drip, orders written by Dr. Roxan Hockey.  ?- Neutral pull on CRRT ?- BMET stable ?- Tender mass to right chest>> Will order [place] to better evaluate ?- Longterm prognosis remains very guarded, husband has been updated regarding this 3/3 ? ?Best Practice: (right click and "Reselect all SmartList Selections" daily)  ? ?Diet/type: tubefeeds  , dysphagia 3 ?DVT prophylaxis: other- bivalirudin ?GI prophylaxis: PPI ?Lines: Central line, Dialysis Catheter, Arterial Line, and yes and it is still needed  ?Foley:  N/A ?Code Status:  full code ?Last date of multidisciplinary goals of care discussion [Family updated 3/4 at bedside]  ? ?Patient critically ill due to shock ?Interventions to address this today pressor titration ?Risk of deterioration without these interventions is high ? ?I personally spent 35 minutes providing  critical care not including any separately billable procedures ? ?Magdalen Spatz, MSN, AGACNP-BC ?[facility] ?See Amion for personal pager ?PCCM on call pager [phone]  ? ?Prefer epic messenger for cross cover needs ?If after hours, please call E-link ? ? ?

## 2022-01-30 NOTE — Progress Notes (Signed)
5 Days Post-Op Procedure(s) (LRB): ?RIGHT HEART CATH (N/A) ?Subjective: ?Off epi since 3 AM ?No complaints this AM ? ?Objective: ?Vital signs in last 24 hours: ?Temp:  [96.3 ?F (35.7 ?C)-98.8 ?F (37.1 ?C)] 97.7 ?F (36.5 ?C) (03/06 0700) ?Pulse Rate:  [74-88] 76 (03/06 0700) ?Cardiac Rhythm: Normal sinus rhythm (03/06 0000) ?Resp:  [15-34] 17 (03/06 0700) ?SpO2:  [82 %-100 %] 93 % (03/06 0700) ?Arterial Line BP: (87-118)/(35-56) 95/43 (03/06 0700) ?Weight:  [71.1 kg] 71.1 kg (03/06 0410) ? ?Hemodynamic parameters for last 24 hours: ?PAP: (37-57)/(14-31) 47/25 ?CVP:  [4 mmHg-16 mmHg] 16 mmHg ?CO:  [3.3 L/min-4.9 L/min] 4.2 L/min ?CI:  [2 L/min/m2-2.9 L/min/m2] 2.6 L/min/m2 ? ?Intake/Output from previous day: ?03/05 0701 - 03/06 0700 ?In: 3515.1 [P.O.:360; I.V.:1095.3; NG/GT:1220; IV Piggyback:759.9] ?Out: 1859 [Stool:200] ?Intake/Output this shift: ?No intake/output data recorded. ? ?General appearance: alert, cooperative, and no distress ?unchanged ? ?Lab Results: ?Recent Labs  ?  01/29/22 ?0345 01/30/22 ?0345  ?WBC 21.4* 19.8*  ?HGB 8.1* 8.2*  ?HCT 27.2* 26.7*  ?PLT 159 160  ? ?BMET:  ?Recent Labs  ?  01/29/22 ?1518 01/30/22 ?0345  ?NA 135 135  ?K 4.2 4.2  ?CL 102 104  ?CO2 23 22  ?GLUCOSE 148* 131*  ?BUN 11 11  ?CREATININE 0.97 0.87  ?CALCIUM 9.2 9.4  ?  ?PT/INR: No results for input(s): LABPROT, INR in the last 72 hours. ?ABG ?   ?Component Value Date/Time  ? PHART 7.425 01/20/2022 1944  ? HCO3 26.2 02/21/2022 0821  ? TCO2 27 02/03/2022 0821  ? ACIDBASEDEF 1.0 01/20/2022 1944  ? O2SAT 43.7 01/28/2022 0447  ? ?CBG (last 3)  ?Recent Labs  ?  01/29/22 ?1943 01/29/22 ?2337 01/30/22 ?0343  ?GLUCAP 83 123* 130*  ? ? ?Assessment/Plan: ?S/P Procedure(s) (LRB): ?RIGHT HEART CATH (N/A) ?- Events of weekend noted ?NEURO- intact ?CV- in SR on amiodarone drip- convert to PO ? PA 49/24, CVP 13 - index 2.5 off all pressors ? On midodrine 15 mg Q6 ?Anticoagulation- on heparin drip ?RESP- on Room air, CXR with some LLL  atelectasis/ consolidation ?RENAL- remains on CVVH ? Plan per Renal ?ENDO- CBG variable, but generally reasonably well controlled ?GI- tolerating TF- nocturnal only  ? Encourage PO intake ?ID- restarted in antibiotics over the weekend due to temp and increased WBC ? Afebrile, WBC down to 19K from 21 K ? Continue maxipime, vanco also on board ?Deconditioning- PT, OT ? LOS: 29 days  ? ? ?[name] ?01/30/2022 ? ? ?

## 2022-01-30 NOTE — Progress Notes (Signed)
eLink Physician-Brief Progress Note ?Patient Name: GRACIE GUPTA ?DOB: Jun 08, 1956 ?MRN: [id number] ? ? ?Date of Service ? 01/30/2022  ?HPI/Events of Note ? Episode of hypoglycemia earlier in the day on Levemir 20 units [place] Q 12 hours while TF's held. Now back on TF's until 6 AM. Blood glucose now = 161.   ?eICU Interventions ? Plan: ?Decrease Levemir to 15 units [place] Q 12 hours.   ? ? ? ?Intervention Category ?Major Interventions: Other: ? ?Bryana Froemming Cornelia Copa ?01/30/2022, 10:30 PM ?

## 2022-01-31 DIAGNOSIS — R570 Cardiogenic shock: Secondary | ICD-10-CM | POA: Diagnosis not present

## 2022-01-31 DIAGNOSIS — R072 Precordial pain: Secondary | ICD-10-CM | POA: Diagnosis not present

## 2022-01-31 LAB — MAGNESIUM: Magnesium: 2.4 mg/dL (ref 1.7–2.4)

## 2022-01-31 LAB — CBC WITH DIFFERENTIAL/PLATELET
Abs Immature Granulocytes: 0.11 10*3/uL — ABNORMAL HIGH (ref 0.00–0.07)
Basophils Absolute: 0.1 10*3/uL (ref 0.0–0.1)
Basophils Relative: 1 %
Eosinophils Absolute: 2.5 10*3/uL — ABNORMAL HIGH (ref 0.0–0.5)
Eosinophils Relative: 16 %
HCT: 25.9 % — ABNORMAL LOW (ref 36.0–46.0)
Hemoglobin: 8 g/dL — ABNORMAL LOW (ref 12.0–15.0)
Immature Granulocytes: 1 %
Lymphocytes Relative: 9 %
Lymphs Abs: 1.4 10*3/uL (ref 0.7–4.0)
MCH: 31.3 pg (ref 26.0–34.0)
MCHC: 30.9 g/dL (ref 30.0–36.0)
MCV: 101.2 fL — ABNORMAL HIGH (ref 80.0–100.0)
Monocytes Absolute: 1.3 10*3/uL — ABNORMAL HIGH (ref 0.1–1.0)
Monocytes Relative: 8 %
Neutro Abs: 10.4 10*3/uL — ABNORMAL HIGH (ref 1.7–7.7)
Neutrophils Relative %: 65 %
Platelets: 185 10*3/uL (ref 150–400)
RBC: 2.56 MIL/uL — ABNORMAL LOW (ref 3.87–5.11)
RDW: 24 % — ABNORMAL HIGH (ref 11.5–15.5)
WBC: 15.8 10*3/uL — ABNORMAL HIGH (ref 4.0–10.5)
nRBC: 8.7 % — ABNORMAL HIGH (ref 0.0–0.2)

## 2022-01-31 LAB — RENAL FUNCTION PANEL
Albumin: 2.3 g/dL — ABNORMAL LOW (ref 3.5–5.0)
Anion gap: 9 (ref 5–15)
BUN: 12 mg/dL (ref 8–23)
CO2: 24 mmol/L (ref 22–32)
Calcium: 9.4 mg/dL (ref 8.9–10.3)
Chloride: 104 mmol/L (ref 98–111)
Creatinine, Ser: 1.11 mg/dL — ABNORMAL HIGH (ref 0.44–1.00)
GFR, Estimated: 55 mL/min — ABNORMAL LOW (ref >60)
Glucose, Bld: 155 mg/dL — ABNORMAL HIGH (ref 70–99)
Phosphorus: 1.8 mg/dL — ABNORMAL LOW (ref 2.5–4.6)
Potassium: 4.1 mmol/L (ref 3.5–5.1)
Sodium: 137 mmol/L (ref 135–145)

## 2022-01-31 LAB — GLUCOSE, CAPILLARY
Glucose-Capillary: 131 mg/dL — ABNORMAL HIGH (ref 70–99)
Glucose-Capillary: 134 mg/dL — ABNORMAL HIGH (ref 70–99)
Glucose-Capillary: 160 mg/dL — ABNORMAL HIGH (ref 70–99)
Glucose-Capillary: 161 mg/dL — ABNORMAL HIGH (ref 70–99)
Glucose-Capillary: 187 mg/dL — ABNORMAL HIGH (ref 70–99)
Glucose-Capillary: 191 mg/dL — ABNORMAL HIGH (ref 70–99)
Glucose-Capillary: 40 mg/dL — CL (ref 70–99)
Glucose-Capillary: 43 mg/dL — CL (ref 70–99)
Glucose-Capillary: 72 mg/dL (ref 70–99)
Glucose-Capillary: 95 mg/dL (ref 70–99)

## 2022-01-31 LAB — HEPARIN LEVEL (UNFRACTIONATED): Heparin Unfractionated: 0.41 IU/mL (ref 0.30–0.70)

## 2022-01-31 MED ORDER — SODIUM CHLORIDE 0.9 % IV SOLN
1.0000 g | INTRAVENOUS | Status: DC
  Administered 2022-02-01 – 2022-02-03 (×3): 1 g via INTRAVENOUS
  Filled 2022-01-31 (×5): qty 1

## 2022-01-31 MED ORDER — CHLORHEXIDINE GLUCONATE 0.12 % MT SOLN
OROMUCOSAL | Status: AC
  Filled 2022-01-31: qty 15

## 2022-01-31 MED ORDER — VITAL 1.5 CAL PO LIQD
600.0000 mL | ORAL | Status: DC
  Administered 2022-01-31 – 2022-02-03 (×4): 600 mL
  Filled 2022-01-31 (×2): qty 1000
  Filled 2022-01-31: qty 711
  Filled 2022-01-31 (×2): qty 1000

## 2022-01-31 MED ORDER — HEPARIN SODIUM (PORCINE) 1000 UNIT/ML IJ SOLN
INTRAMUSCULAR | Status: AC
  Administered 2022-01-31: 1000 [IU]
  Filled 2022-01-31: qty 3

## 2022-01-31 MED ORDER — VANCOMYCIN VARIABLE DOSE PER UNSTABLE RENAL FUNCTION (PHARMACIST DOSING): Status: DC

## 2022-01-31 NOTE — Progress Notes (Signed)
? ?NAME:  AMARRAH MEINHART, MRN:  [id number], DOB:  07-Jan-1956, LOS: 33 ?ADMISSION DATE:  01/08/2022, CONSULTATION DATE:  01/21/2022 ?REFERRING MD:  Marlowe Sax - TRH, CHIEF COMPLAINT:  SOB  ? ?History of Present Illness:  ?66 year old woman who presented to [facility] ED 2/4 with CP radiating to her arm. PMHx significant for  ESRD (on HD), non-obstructive CAD, suspected pulmonary hypertension, PE (on Eliquis). Recent ED presentation 2/1 for similar symptoms (troponin 100s). F/u with Cardiology as an outpatient 2/2 and CP was felt to be related to volume shift (occurring during dialysis).  ? ?On ED presentation 2/4, patient reported episodic chest pain, non-exertional and intermittent x 1 week with associated nausea and SOB. Non-specific EKG changes. Cardiology consulted, STEMI ruled out. Troponin elevated at 200. Placed on [place] for SpO2 80s. Admitted to [facility] for observation with concern for PE and started on heparin gtt. VQ with low probability for PE. Echo 2/19 demonstrated severe RV/RA enlargement, severely reduced RV function and septal bowing; felt to be consistent with chronic process such as severe pulmonary HTN.  ?  ?PCCM has been consulted in this setting. ? ?Pertinent Medical History:  ?ESRD ?PE ?Pulmonary HTN  ?CAD ?CVA ?HLD ?HTN ?S/p MV repair  ?Pericardial effusion r/t minoxidil  ?Diastolic HF ?DM2 ? ?Significant Hospital Events: ?Including procedures, antibiotic start and stop dates in addition to other pertinent events   ?2/4 ED presentation with recurring chest pain -- STEMI ruled out. Stable, mildly hypoxic and placed on [place] ?2/5 Admit to [facility] for obvs. Concern for PE, ECHO performed by cardiology reportedly shows RV failure, but felt more consistent with chronic process like severe pulmHTN. Awaiting VQ scan. Pulm consulted  ?2/7 Cardiac cath. 3 vessel dz. CABG rec ?2/10 CABG, TVR ?2/11 Continued vasoplegic shock with very high vasopressor requirements  ?2/12 Given methylene blue x 2 ?2/13 Chest tube removed ?2/14 JP  removed ?2/15 pacing wires removed ?2/16 milrinone stopped, extubated ?2/17 Zosyn for GNR bacteremia-> Morganella morganii ?2/18 Arterial line, central line, vascath all removed and replaced. ?2/19 Milrinone back on. Co-ox improved. ECHO Left ventricular ejection fraction, by estimation, is 50 to 55%. The left ventricle has low normal function. No regional WM abnormality. Right Ventricle: Ventricular septum is flattened in systole and diastole  suggesting RV pressure and volume overload. The right ventricular size is  severely enlarged. Right vetricular wall thickness was not well  visualized. Right ventricular systolic  function is severely reduced. There is moderately elevated pulmonary  ?artery systolic pressure ?2/20 still high pressor needs. CRRT stopped. Having intermittent bouts of tachycardia ?2/21 Hemodynamics better. Weaning Epi. Still on Norepi and inotropic support. Supported on BIPAP over night and tolerated well. On am rounds more lethargic w/ generalized pain everywhere. Co-ox > 70. Glucose poorly controlled. ?2/22 Escalating pressor requirements (NE, Epi, Vaso). Continues on milrinone. Afib with RVR overnight, now with persistent AF (on amio, bival). Co-ox 73%. Hgb 7.3. WBC uptrending (25 from 20), Cr uptrending, CRRT restarted. Tolerating BiPAP. ?2/23 Improving pressor needs. Stable Epi/Vaso, downtitrating NE. Milrinone 0.25. Persistent AF, rate controlled. Co-ox 65.7%. Ongoing CRRT. BiPAP QHS. ?2/24 Continued decreasing pressor needs, stable epi/vaso, downtitrating Levo.  Milrinone 0.25.  Intermittent A-fib, in and out of NSR, rates 90s.  Co-ox 76.1%.  Ongoing CRRT, did not utilize BiPAP overnight. FEES with SLP. ?2/25 confused.  Pressor requirements down some.  Got 1 unit of blood for hemoglobin of 7, Co. ox dropped. Vasopressin decreased to 0.03 units.  ?3/2 stopped epinephrine, Co. oximetry dropped quickly ?3/3 back  on epinephrine, with plan to eventually titrate vasopressin off and trial  dobutamine.  Currently on room air.  Co. oximetry's in the 40s, hypoglycemic requiring titration down on basal dose insulin after changing tube feeds to nocturnal ?3/6 off Epi since 3 am  ? ?Interim History / Subjective:  ?Off pressors ?Remains on CRRT ?Still some tenderness in R breast, [place] read pending. ?Still not eating much ? ?Objective:  ?Blood pressure (!) 75/53, pulse 82, temperature 98.8 ?F (37.1 ?C), resp. rate 13, height 4' 11.5" (1.511 m), weight 70.2 kg, SpO2 91 %. ?PAP: (44-60)/(15-38) 53/30 ?CVP:  [13 mmHg-22 mmHg] 20 mmHg ?CO:  [3 L/min-4.8 L/min] 4.8 L/min ?CI:  [1.8 L/min/m2-2.9 L/min/m2] 2.9 L/min/m2  ?   ? ?Intake/Output Summary (Last 24 hours) at 01/31/2022 1048 ?Last data filed at 01/31/2022 1000 ?Gross per 24 hour  ?Intake 2264.3 ml  ?Output 3534 ml  ?Net -1269.7 ml  ? ? ?Filed Weights  ? 01/29/22 0445 01/30/22 0410 01/31/22 0500  ?Weight: 70.6 kg 71.1 kg 70.2 kg  ? ?Physical Examination: ?No distress ?Indurated tender area on R breast stable ?Lungs clear ?Ext warm ?Abdomen soft ?Femoral lines look CDI ?Aox3 ? ?WBC improved ?H/H stable ? ? ? ?Assessment & Plan:  ?Acute decompensated R>L heart failure  ?with cardiogenic shock ?Mixed pulmonary HTN leading to above ?Atrial fibrillation on AC and amio ?Acute hypoxemic resp failure in setting of advanced multifactorial pulmonary HTN ?ICU delirium ?H/O PE ?CAD post CABG x3, TVR, PFO closure 01/05/2022 ?ESRD on HD- thrombosed AVF ?Probable renal vasoplegia complicating shock picture ?Dysphagia ?Anemia of chronic renal failure ?DM2 with hyperglycemia currently on levemir + SSI ?Morganella bacteremia treated ?Anemia stable ? ?- Vanc/cefepime x 7 days for sepsis ?- Decrease levemir ?- PRN phenergan ?- Continue midodrine 15 mg per tube Q 6 hours  ?- Amio per TCTS and CHF team ?- when can we try iHD? Will ask nephro/CHF ?- f/u breast US ?- Try lowering night-time tube feeds to increase PO during day, if she does not we need to consider PEG along with tunneled HD  catheter ?- Longterm prognosis remains very guarded, husband has been updated regarding this 3/3, remains hopefuly ? ?Best Practice: (right click and "Reselect all SmartList Selections" daily)  ? ?Diet/type: tubefeeds  , dysphagia 3 ?DVT prophylaxis: other- bivalirudin ?GI prophylaxis: PPI ?Lines: Central line, Dialysis Catheter, Arterial Line, and yes and it is still needed  ?Foley:  N/A ?Code Status:  full code ?Last date of multidisciplinary goals of care discussion [Husband updated 3/6 at bedside]  ? ?Remains in ICU while on CRRT ? ?Erskine Emery MD PCCM ?

## 2022-01-31 NOTE — Progress Notes (Addendum)
Pharmacy Antibiotic Note ? ?Jody Nguyen is a 66 y.o. female admitted on 01/08/2022 with Morganella bacteremia and treated with Zosyn - completed 2/26. Vancomycin and cefepime started for increased WBC count on 3/5. Pt has been maintained on CRRT, now to attempt to transition to iHD. Last vancomycin dose was ~24h ago, therefore has likely cleared from CRRT. Will give one additional dose and then follow iHD plans for redosing, pt will likely need vancomycin random after next iHD. ? ?Plan: ?Adjust cefepime to 1g IV q24h starting tonight ?Hold vancomycin after todays dose  ? ?Height: 4' 11.5" (151.1 cm) ?Weight: 70.2 kg (154 lb 12.2 oz) ?IBW/kg (Calculated) : 44.35 ? ?Temp (24hrs), Avg:98.4 ?F (36.9 ?C), Min:96.6 ?F (35.9 ?C), Max:99.7 ?F (37.6 ?C) ? ?Recent Labs  ?Lab 01/27/22 ?0310 01/27/22 ?1619 01/28/22 ?9357 01/28/22 ?1610 01/29/22 ?0345 01/29/22 ?1518 01/30/22 ?0345 01/30/22 ?1750 01/31/22 ?0439  ?WBC 13.3*  --  17.8*  --  21.4*  --  19.8*  --  15.8*  ?CREATININE 1.12*   < > 1.06*   < > 1.12* 0.97 0.87 0.95 1.11*  ? < > = values in this interval not displayed.  ? ?  ?Estimated Creatinine Clearance: 43.1 mL/min (A) (by C-G formula based on SCr of 1.11 mg/dL (H)).   ? ?Allergies  ?Allergen Reactions  ? Minoxidil Other (See Comments)  ?  Pericardial effusion  ? Lipitor [Atorvastatin] Other (See Comments)  ?  MYALGIAS > "pain in legs" ?Tolerates rosuvastatin  ? Morphine And Related Itching  ? Ace Inhibitors Other (See Comments)  ?  REACTION: "not sure...think it made me drowsy all the time"  ? ?Vancomycin 2/17 > 2/18  resume 3/5>(3/11) ?Cefepime 3/5. (3/11) ?Zosyn 2/17 > 2/26 ? ?2/17 BCx x 2 > morganella - S to zosyn ?2/17 MRSA PCR neg ? ?Arrie Senate, PharmD, BCPS, BCCP ?Clinical Pharmacist ?[phone] ?Please check AMION for all [place] numbers ?01/31/2022 ? ? ?

## 2022-01-31 NOTE — Progress Notes (Signed)
Nutrition Follow-up ? ?DOCUMENTATION CODES:  ? ?Not applicable ? ?INTERVENTION:  ? ?Tube Feeding via Cortrak: Nocturnal-reducing rate and infusion time to see if this improves appetite per MD request ?Vital 1.5 at 60 ml/hr x 10 hours ?Provides 900 kcals, 41 g of protein and 1094 mL of free water ? ?30 ml ProSource Plus BID, each supplement provides 100 kcals and 15 grams protein.  ? ?Magic cup BID with meals, each supplement provides 290 kcal and 9 grams of protein ? ?Feeding assistance at all meals ? ?Pt declines oral nutrition supplements such as Ensure/Boost ? ?NUTRITION DIAGNOSIS:  ? ?Inadequate oral intake related to inability to eat as evidenced by NPO status. ? ?Being addressed via TF  ? ?GOAL:  ? ?Patient will meet greater than or equal to 90% of their needs ? ?Progressing ? ?MONITOR:  ? ?Diet advancement, Labs, Weight trends, TF tolerance, Skin, I & O's ? ?REASON FOR ASSESSMENT:  ? ?Consult ?Enteral/tube feeding initiation and management ? ?ASSESSMENT:  ? ?66 y.o. female with history of HTN, DM type 2, HKD, pulmonary HTN, ESRD on HD, CHF, hx CVA, and vitamin D deficiency, presented to ED with radiating chest pain, SOB, and nausea. ? ?  ?2/07 Left heart catherization, showed severe coronary calcification and multivessel CAD. ?2/10  CABG x 3, TVR ?2/11  CRRT initiated ?2/13 TF initiated ?2/14 TF on  hold for emesis ?2/15 cortrak placed (gastric), TF resumed ?2/16 extubated ?2/27 Diet advanced to Dysphagia 3, Nectar post MBS ? ?Stopping CRRT today, plan for iHD tomorrow.  ?Phosphorus 1.7 today; if does not improve with stopping CRRT, recommend supplementing ? ?PO intake remains poor; limited documentation of po intake 10-25% of meals ? ?RN reports pt did not eat breakfast this AM. On visit today, pt very sleepy. Arousable but falls asleep easily. Unable to stay awake long enough to eat lunch. Pt reports she is not hungry and cannot think of anything that sounds good to her.  ? ?Pt has been receiving Nocturnal  TF of vital 1.5 at 75 ml/hr x 12 hours per day (900 mL/day).  ? ?Liquid stool continues via rectal tube ? ?Current wt 70.2 kg; Outpatient EDW 75.5 kg ? ?Vitamin Lab Panel:  ?Vitamin B1: 214 (H) ?Vitamin B6: 6.9 (wdl) ?Vitamin B12: 1742 (H) ?Vitamin C: 0.8 (wdl) ?Folate: 15.8 (wdl) ?Copper: 156 (wdl) ?Zinc: 98 (wdl) ? ?Labs: phosphorus 1.8 (L) ?Meds: ss novolog, levemir, rena vite, aranesp, hectoral ? ? ?Diet Order:   ?Diet Order   ? ?       ?  DIET DYS 3 Room service appropriate? Yes; Fluid consistency: Nectar Thick  Diet effective now       ?  ? ?  ?  ? ?  ? ? ?EDUCATION NEEDS:  ? ?Not appropriate for education at this time ? ?Skin:  Skin Assessment: Skin Integrity Issues: ?Skin Integrity Issues:: Stage II ?Stage II: rectum ?Incisions: closed chest, rt leg ? ?Last BM:  3/7 rectal tube ? ?Height:  ? ?Ht Readings from Last 1 Encounters:  ?01/01/22 4' 11.5" (1.511 m)  ? ? ?Weight:  ? ?Wt Readings from Last 1 Encounters:  ?01/31/22 70.2 kg  ? ? ?Ideal Body Weight:  45.5 kg ? ?BMI:  Body mass index is 30.74 kg/m?. ? ?Estimated Nutritional Needs:  ? ?Kcal:  1800-2000 kcals ? ?Protein:  90-105 grams ? ?Fluid:  1L+UOP ? ?Kerman Passey MS, RDN, LDN, CNSC ?Registered Dietitian III ?Clinical Nutrition ?RD Pager and On-Call Pager Number Located in [place]  ? ?

## 2022-01-31 NOTE — Progress Notes (Signed)
Occupational Therapy Treatment ?Patient Details ?Name: Jody Nguyen ?MRN: [id number] ?DOB: [birth year] ?Today's Date: 01/31/2022 ? ? ?History of present illness 66 yo female presenting 2/5 with recurrent chest pain. Pt now s/p CABG x3 and tricuspid valve annuloplasty on 2/10 requiring 6 units PRBCs, 2 units FFP, and 2 bags of platelets and resulting in vasoplegic shock with very high vasopressor requirements. Started on CRRT 2/11, extubated 2/16. S/p right heart cath 3/1 and Swan-ganz placed. Off CRRT 3/7. PMH includes: anemia, CHF, CKD IV on HD MWF, CVA, DM II, HLD, HTN, pulmonary HTN. ?  ?OT comments ? Upon arrival, pt supine in bed with [facility] raised for eating lunch with RN at bedside. Rn reporting pt more lethargic today. Pt requiring Mod-Max A +2 for bed mobility and then demonstrating decreased sitting balance at EOB. Requiring Max A +2 to pivot to recliner using drop arm recliner. Once in recliner and upright, pt requiring Max cues and Min hand over hand for engaging in self feeding task; demonstrating poor attention, problem solving, and activity tolerance. Also noting pt with tendency for attention and eyes tracking to R; notified RN. Continue to recommend dc to AIR and will continue to follow acutely as admitted.   ? ?Recommendations for follow up therapy are one component of a multi-disciplinary discharge planning process, led by the attending physician.  Recommendations may be updated based on patient status, additional functional criteria and insurance authorization. ?   ?Follow Up Recommendations ? Acute inpatient rehab (3hours/day)  ?  ?Assistance Recommended at Discharge Frequent or constant Supervision/Assistance  ?Patient can return home with the following ? Two people to help with walking and/or transfers;Two people to help with bathing/dressing/bathroom ?  ?Equipment Recommendations ? Other (comment) (Defer to next venue)  ?  ?Recommendations for Other Services PT consult ? ?  ?Precautions /  Restrictions Precautions ?Precautions: Fall;Sternal;Other (comment) ?Precaution Comments: flexiseal, cortrack, Swan-ganz femoral, s/p R heart cath 3/1 ?Restrictions ?Other Position/Activity Restrictions: sternal precautions  ? ? ?  ? ?Mobility Bed Mobility ?Overal bed mobility: Needs Assistance ?Bed Mobility: Sidelying to Sit, Rolling ?Rolling: Mod assist, +2 for physical assistance ?Sidelying to sit: Max assist, +2 for physical assistance ?  ?  ?  ?General bed mobility comments: Mod A for rolling and then Max A +2 for bringing BLEs over EOB and elevating trunk ?  ? ?Transfers ?Overall transfer level: Needs assistance ?Equipment used: 2 person hand held assist ?Transfers: Bed to chair/wheelchair/BSC ?  ?Stand pivot transfers: Max assist, +2 physical assistance ?  ?  ?  ?  ?General transfer comment: Max A +2 for stand pivot to recliner with use of bed pad to bring hips forward and then over. Use of drop arm recliner ?  ?  ?Balance Overall balance assessment: Needs assistance ?Sitting-balance support: Bilateral upper extremity supported, Feet supported ?Sitting balance-Leahy Scale: Poor ?Sitting balance - Comments: Requiring atleast Min A for sitting balance ?Postural control: Posterior lean ?Standing balance support: Bilateral upper extremity supported, During functional activity ?Standing balance-Leahy Scale: Zero ?Standing balance comment: Max A for standing ?  ?  ?  ?  ?  ?  ?  ?  ?  ?  ?  ?   ? ?ADL either performed or assessed with clinical judgement  ? ?ADL Overall ADL's : Needs assistance/impaired ?Eating/Feeding: Cueing for sequencing;Maximal assistance;Sitting ?Eating/Feeding Details (indicate cue type and reason): Min hand over hand for bringing spoon to mouth. Requiring Max A for scooping ice cream and intating task. ?  ?  ?  ?  ?  ?  ?  ?  ?  ?  ?  Toilet Transfer: Maximal assistance;+2 for safety/equipment;+2 for physical assistance;Stand-pivot ?Toilet Transfer Details (indicate cue type and reason): Max A  +2 for stand pivot to recliner with use of bed pad to bring hips forward and then over. ?  ?  ?  ?  ?Functional mobility during ADLs: Maximal assistance;+2 for physical assistance ?General ADL Comments: Max A +2 for stand pivot to recliner with use of bed pad to bring hips forward and then over. Once in recliner, pt requiring Max A for engaging in self feeding task ?  ? ?Extremity/Trunk Assessment Upper Extremity Assessment ?Upper Extremity Assessment: RUE deficits/detail;LUE deficits/detail ?RUE Deficits / Details: edematous RUE; R hand in dependent position; arm elevated with hand higher than elbow; educated husband on ROM; able to lift hand off bed and slide on tummy; uanble to actively reach mouth, but able to reach mouth with AAROM ?RUE Coordination: decreased fine motor;decreased gross motor ?LUE Deficits / Details: Generalized weakness ?LUE Coordination: decreased fine motor;decreased gross motor ?  ?Lower Extremity Assessment ?Lower Extremity Assessment: Defer to PT evaluation ?  ?  ?  ? ?Vision   ?  ?  ?Perception   ?  ?Praxis   ?  ? ?Cognition Arousal/Alertness: Lethargic ?Behavior During Therapy: Flat affect ?Overall Cognitive Status: Difficult to assess ?  ?  ?  ?  ?  ?  ?  ?  ?  ?  ?  ?  ?  ?  ?  ?  ?General Comments: Patient more lethargic; RN report this has been her state today. Pt requiring singificant time to respond to yes/no questions and follow commands. Following ~10% of comments. ?  ?  ?   ?Exercises   ? ?  ?Shoulder Instructions   ? ? ?  ?General Comments VSS on 2L O2  ? ? ?Pertinent Vitals/ Pain       Pain Assessment ?Pain Assessment: Faces ?Faces Pain Scale: Hurts little more ?Pain Intervention(s): Monitored during session ? ?Home Living   ?  ?  ?  ?  ?  ?  ?  ?  ?  ?  ?  ?  ?  ?  ?  ?  ?  ?  ? ?  ?Prior Functioning/Environment    ?  ?  ?  ?   ? ?Frequency ? Min 2X/week  ? ? ? ? ?  ?Progress Toward Goals ? ?OT Goals(current goals can now be found in the care plan section) ? Progress towards  OT goals: Progressing toward goals ? ?Acute Rehab OT Goals ?Patient Stated Goal: to get stronger ?OT Goal Formulation: With patient/family ?Time For Goal Achievement: 02/14/22 ?Potential to Achieve Goals: Good ?ADL Goals ?Pt Will Perform Grooming: with min assist;sitting ?Pt/caregiver will Perform Home Exercise Program: Both right and left upper extremity;With written HEP provided;With minimal assist ?Additional ADL Goal #1: Pt will tolerate 20 minutes of semi standing position with kreg bed tilted at 45* in preparation for ADLs ?Additional ADL Goal #2: Pt will demonstrate selective attention during ADLs with Min cues  ?Plan Discharge plan remains appropriate   ? ?Co-evaluation ? ? ?   ?  ?  ?  ?  ? ?  ?AM-PAC OT "6 Clicks" Daily Activity     ?Outcome Measure ? ? Help from another person eating meals?: A Lot ?Help from another person taking care of personal grooming?: A Lot ?Help from another person toileting, which includes using toliet, bedpan, or urinal?: Total ?Help from another person bathing (including washing, rinsing,  drying)?: Total ?Help from another person to put on and taking off regular upper body clothing?: Total ?Help from another person to put on and taking off regular lower body clothing?: Total ?6 Click Score: 8 ? ?  ?End of Session Equipment Utilized During Treatment: Oxygen ? ?OT Visit Diagnosis: Other abnormalities of gait and mobility (R26.89);Unsteadiness on feet (R26.81);Muscle weakness (generalized) (M62.81) ?  ?Activity Tolerance Patient tolerated treatment well ?  ?Patient Left in chair;with call bell/phone within reach;with nursing/sitter in room ?  ?Nurse Communication Mobility status ?  ? ?   ? ?Time: 4834-7583 ?OT Time Calculation (min): 34 min ? ?Charges: OT General Charges ?$OT Visit: 1 Visit ?OT Treatments ?$Self Care/Home Management : 23-37 mins ? ?Jody Nguyen MSOT, OTR/L ?Acute Rehab ?Pager: [phone] ?Office: [phone] ? ?Tramaine Sauls M Hedda Crumbley ?01/31/2022, 3:45 PM ? ? ?

## 2022-01-31 NOTE — Progress Notes (Signed)
?[facility] ?Progress Note  ? ?Assessment/ Plan:   ?OP HD: MWF [facility] ?3:15h   450/A1.5   75.5kg   2K/2Ca bath  P2   AVF  Hep  2500 ? - COVID neg 2/04, hep B SAg neg on 01/03/22 ?- Hectoral 42mcg IV q HD ?- Sensipar $RemoveBef'90mg'drrBFniUfw$  PO q HD ?- Mircera 162mcg IV q 2 weeks (ordered, not yet given. Last Mircera 100 on 12/07/21) ?  ?  ?  ?Assessment/Plan:  ? CAD s/p CABG x3, TR sp TVR:  per CT surgery  ? ESRD:  normally MWF schedule. CRRT started on 2/07, will dc today. Plan gentle HD tomorrow.  ?Shock - cardiogenic and septic. Has RV failure- RHC 02/10/2022-->  well-compensated hemodynamics. Continues on midodrine tid. Off all pressors and inotropes.   ?Morganella bacteremia -  s/p Zosyn ? Anemia CKD: Sq darbe 100ug weekly on Saturday. 1st 2/25. ? Metabolic bone disease: resume home binders and sensipar when taking PO. Cont IV vdra.  ? Infiltrated AVF - AVF infiltrated on 2/7. Post-op AVF is nonfunctional, VVS aware. Planning for [facility] when more stable.  ?Pulm HTN:  Followed at [facility], holding macitenan (opsumit) ? T2DM-per primary service ? Hx of MV replacement - in 2016 ?Dispo: ICU ? ?Kelly Splinter, MD ?01/31/2022, 11:48 AM ? ? ? ? ?Subjective:   ? ?Seen in room, no c/o  ? ?Objective:   ?BP (!) 75/53 Comment: following aline  Pulse 82   Temp 98.8 ?F (37.1 ?C)   Resp 13   Ht 4' 11.5" (1.511 m)   Wt 70.2 kg   SpO2 91%   BMI 30.74 kg/m?  ? ?Physical Exam: ?XLK:GMWNU, cortrak in place, eyes open, nad ?CVS: reg RR ?Resp: coarse ?Abd: soft obese nontender ?Ext: 1+ RUE edema, no other edema ?ACCESS: nontunneled HD cath R fem ? ?Labs: ?BMET ?Recent Labs  ?Lab 01/28/22 ?2725 01/28/22 ?1610 01/29/22 ?0345 01/29/22 ?1518 01/30/22 ?0345 01/30/22 ?1750 01/30/22 ?2208 01/31/22 ?0439  ?NA 134* 137 135 135 135 136 137 137  ?K 4.8 4.3 4.7 4.2 4.2 4.0 4.3 4.1  ?CL 100 103 102 102 104 104  --  104  ?CO2 21* $Remove'25 22 23 22 25  'FLowidm$ --  24  ?GLUCOSE 200* 130* 240* 148* 131* 95  --  155*  ?BUN $Rem'10 10 11 11 11 11  'ySrY$ --  12  ?CREATININE 1.06* 1.01* 1.12*  0.97 0.87 0.95  --  1.11*  ?CALCIUM 9.5 9.8 9.7 9.2 9.4 9.5  --  9.4  ?PHOS 2.5 2.1* 1.7* 3.8 2.3* 2.0*  --  1.8*  ? ? ?CBC ?Recent Labs  ?Lab 01/28/22 ?3664 01/29/22 ?0345 01/30/22 ?0345 01/30/22 ?2208 01/31/22 ?4034  ?WBC 17.8* 21.4* 19.8*  --  15.8*  ?NEUTROABS 15.0* 17.2* 14.4*  --  10.4*  ?HGB 8.1* 8.1* 8.2* 9.5* 8.0*  ?HCT 26.6* 27.2* 26.7* 28.0* 25.9*  ?MCV 101.9* 102.3* 101.5*  --  101.2*  ?PLT 152 159 160  --  185  ? ? ? ?  ?Medications:   ? ? amiodarone  200 mg Oral BID  ? aspirin EC  325 mg Oral Daily  ? Or  ? aspirin  324 mg Per Tube Daily  ? chlorhexidine gluconate (MEDLINE KIT)  15 mL Mouth Rinse BID  ? Chlorhexidine Gluconate Cloth  6 each Topical Daily  ? darbepoetin (ARANESP) injection - NON-DIALYSIS  100 mcg Subcutaneous Q Sat-1800  ? doxercalciferol  9 mcg Intravenous Q M,W,F-HD  ? ezetimibe  10 mg Oral Daily  ?  feeding supplement (PROSource TF)  45 mL Per Tube BID  ? feeding supplement (VITAL 1.5 CAL)  900 mL Per Tube Q24H  ? insulin aspart  0-20 Units Subcutaneous Q4H  ? insulin detemir  15 Units Subcutaneous BID  ? latanoprost  1 drop Both Eyes QHS  ? midodrine  15 mg Oral Q6H  ? multivitamin  1 tablet Per Tube QHS  ? pantoprazole sodium  40 mg Per Tube Daily  ? rosuvastatin  10 mg Per Tube QHS  ? sodium chloride flush  10-40 mL Intracatheter Q12H  ? sodium chloride flush  3 mL Intravenous Q12H  ? ? ? ? ?

## 2022-01-31 NOTE — Progress Notes (Signed)
ANTICOAGULATION CONSULT NOTE ? ?Pharmacy Consult for bivalirudin>>heparin ?Indication: history of pulmonary embolus 2021 ? ?Allergies  ?Allergen Reactions  ? Minoxidil Other (See Comments)  ?  Pericardial effusion  ? Lipitor [Atorvastatin] Other (See Comments)  ?  MYALGIAS > "pain in legs" ?Tolerates rosuvastatin  ? Morphine And Related Itching  ? Ace Inhibitors Other (See Comments)  ?  REACTION: "not sure...think it made me drowsy all the time"  ? ? ?Patient Measurements: ?Height: 4' 11.5" (151.1 cm) ?Weight: 70.2 kg (154 lb 12.2 oz) ?IBW/kg (Calculated) : 44.35 ?Heparin DW: 70.8kg  ? ?Vital Signs: ?Temp: 98.8 ?F (37.1 ?C) (03/07 0800) ?Temp Source: Core (03/07 0400) ?Pulse Rate: 82 (03/07 0800) ? ?Labs: ?Recent Labs  ?  01/29/22 ?0345 01/29/22 ?1518 01/30/22 ?0345 01/30/22 ?1750 01/30/22 ?2208 01/31/22 ?0439  ?HGB 8.1*  --  8.2*  --  9.5* 8.0*  ?HCT 27.2*  --  26.7*  --  28.0* 25.9*  ?PLT 159  --  160  --   --  185  ?HEPARINUNFRC 0.19*  --  0.25*  --   --  0.41  ?CREATININE 1.12*   < > 0.87 0.95  --  1.11*  ? < > = values in this interval not displayed.  ? ? ? ?Estimated Creatinine Clearance: 43.1 mL/min (A) (by C-G formula based on SCr of 1.11 mg/dL (H)). ? ? ?Assessment: ?65 yo female on chronic apixaban for hx of PE 2021. Apixaban has been held for surgery.  Pharmacy asked to resume anticoagulation post op 2/15 with bivalirudin.  HIT antibody and SRA both negative.  Platelet count now recovered 50>208>150-160s.  ? ?Patient on heparin (s/p bivalirudin) as HIT work-up negative. Heparin level therapeutic at 0,41, H/H stable, pltc continues to improve. ? ?Goal of Therapy:  ?Heparin level 0.3-0.5 - target lower end given low platelet count/risk of post-operative bleeding ?Monitor platelets by anticoagulation protocol: Yes ?  ?Plan:  ?-Continue heparin 1200 units/h ?-Daily heparin level and CBC ? ?Arrie Senate, PharmD, BCPS, BCCP ?Clinical Pharmacist ?[phone] ?Please check AMION for all [place]  numbers ?01/31/2022 ? ?

## 2022-01-31 NOTE — Progress Notes (Signed)
Patient delirious overnight, constantly moaning and yelling out for help and for family. Reoriented patient to place, time, situation. She would accept my orientation and then immediately yell out for help, even when RN was present at bedside.  ? ?PRN medications given per [facility] when indicated by patient having pain or anxiety. Patient finally went to sleep around 0500.  ?

## 2022-01-31 NOTE — Telephone Encounter (Signed)
01/31/22-Called patient to schedule visit w/ cpp and go over ED discharge. [place]. ? ?Total time spent: 2 Minutes ?Vanetta Shawl, [facility] ?

## 2022-01-31 NOTE — Progress Notes (Signed)
6 Days Post-Op Procedure(s) (LRB): ?RIGHT HEART CATH (N/A) ?Subjective: ?Sleepy this Am ?Some delirium overnight ? ?Objective: ?Vital signs in last 24 hours: ?Temp:  [96.6 ?F (35.9 ?C)-99.7 ?F (37.6 ?C)] 99.1 ?F (37.3 ?C) (03/07 0700) ?Pulse Rate:  [73-113] 87 (03/07 0700) ?Cardiac Rhythm: Normal sinus rhythm (03/07 0400) ?Resp:  [15-31] 31 (03/07 0700) ?SpO2:  [79 %-100 %] 85 % (03/07 0700) ?Arterial Line BP: (91-115)/(33-57) 97/45 (03/07 0700) ?Weight:  [70.2 kg] 70.2 kg (03/07 0500) ? ?Hemodynamic parameters for last 24 hours: ?PAP: (44-60)/(15-38) 53/31 ?CVP:  [13 mmHg-22 mmHg] 21 mmHg ?CO:  [3 L/min-4.7 L/min] 3 L/min ?CI:  [1.8 L/min/m2-2.8 L/min/m2] 1.8 L/min/m2 ? ?Intake/Output from previous day: ?03/06 0701 - 03/07 0700 ?In: 2414.4 [I.V.:805.5; NG/GT:1158.8; IV Piggyback:350.1] ?Out: 1308 [Stool:500] ?Intake/Output this shift: ?No intake/output data recorded. ? ?General appearance: cooperative and no distress ?Neurologic: intact ?Heart: regular rate and rhythm ?Lungs: clear to auscultation bilaterally ?Wound: clean and dry ? ?Lab Results: ?Recent Labs  ?  01/30/22 ?0345 01/30/22 ?2208 01/31/22 ?6578  ?WBC 19.8*  --  15.8*  ?HGB 8.2* 9.5* 8.0*  ?HCT 26.7* 28.0* 25.9*  ?PLT 160  --  185  ? ?BMET:  ?Recent Labs  ?  01/30/22 ?1750 01/30/22 ?2208 01/31/22 ?0439  ?NA 136 137 137  ?K 4.0 4.3 4.1  ?CL 104  --  104  ?CO2 25  --  24  ?GLUCOSE 95  --  155*  ?BUN 11  --  12  ?CREATININE 0.95  --  1.11*  ?CALCIUM 9.5  --  9.4  ?  ?PT/INR: No results for input(s): LABPROT, INR in the last 72 hours. ?ABG ?   ?Component Value Date/Time  ? PHART 7.402 01/30/2022 2208  ? HCO3 27.0 01/30/2022 2208  ? TCO2 28 01/30/2022 2208  ? ACIDBASEDEF 1.0 01/20/2022 1944  ? O2SAT 99 01/30/2022 2208  ? ?CBG (last 3)  ?Recent Labs  ?  01/30/22 ?2206 01/30/22 ?2352 01/31/22 ?0443  ?GLUCAP 161* 152* 161*  ? ? ?Assessment/Plan: ?S/P Procedure(s) (LRB): ?RIGHT HEART CATH (N/A) ?- ?CV- remains off inotropes, PA Pressures up slightly and CI down  slightly ? On midodrine SBP 90-110 ? In SR on amiodarone PO ? On heparin drip- Hx of PE, immobility ?RESP_ Sat 91% on RA ?RENAL- still on CRRT, 1L negative yesterday ?ENDO- CBG dropped yesterday afternoon with TF held and limited PO intake ? Levemir decreased ?GI- tolerating TF ? Encourage PO ?ID- afebrile, WBC trending down on Vanco + Maxipime ?Deconditioning - PT, OT ? LOS: 30 days  ? ? ?[name] ?01/31/2022 ? ? ?

## 2022-01-31 NOTE — Progress Notes (Signed)
Patient ID: Jody Nguyen, female   DOB: 07-29-1956, 66 y.o.   MRN: [id number] ?TCTS Evening Rounds: ? ?Hemodynamically stable  ?CI 2.5, CVP 17. ? ?I/O even today on CRRT ? ?Tmax 100.2 this afternoon, temp has come down now to 98.8 ?

## 2022-01-31 NOTE — Progress Notes (Addendum)
Advanced Heart Failure Rounding Note  PCP-Cardiologist: Olga Millers, MD   Subjective:    3/1: RHC with swan placement (on NE 2, EPi 3, Milrinone 0.25 and VP 0.02) w/ surprisingly well-compensated hemodynamics (see data below). Milrinone discontinued given low SVR 3/5: Empiric vancomycin/cefepime started for fever, temp 100, rising WBCs and PCT (1.05)  Has been off IV pressor support for >24 hrs (epi stopped yesterday am). Continues on midodrine 15 q6h. SPBs upper 90s-low 100s.    Able to tolerate CVVH volume removal yesterday w/ nearly 3L fluid removal. Wt down 2 lb. CVP trending back up, 16 today. PAPi 1.5   WBC trending down, 21>>19>>15K. Afebrile.   Delirious overnight. Improved today, currently sleeping, resting comfortably.    Swan #s today  CVP 16 PA 52/28 (37) CI 2.89 PAPI 1.5   RHC 3/1 (on 4 vasopressors/inotropes)   Ao = 104/48 (63) RA = 11 RV = 35/10 PA = 53/26 (34) PCW = 17  Fick cardiac output/index = 6.4/3.8 Thermal CO/CI = 5.2/3.1 PVR (Fick) = 2.7 PVR (Therm) = 3.3 SVR (Fick) = 650 SVR (Therm) = 807 Ao sat = 92% PA sat = 58%, 57%  Objective:   Weight Range: 70.2 kg Body mass index is 30.74 kg/m.   Vital Signs:   Temp:  [96.6 F (35.9 C)-99.7 F (37.6 C)] 98.8 F (37.1 C) (03/07 0800) Pulse Rate:  [73-113] 82 (03/07 0800) Resp:  [13-31] 13 (03/07 0800) SpO2:  [79 %-100 %] 91 % (03/07 0800) Arterial Line BP: (91-115)/(33-57) 100/47 (03/07 0800) Weight:  [70.2 kg] 70.2 kg (03/07 0500) Last BM Date : 01/30/22  Weight change: Filed Weights   01/29/22 0445 01/30/22 0410 01/31/22 0500  Weight: 70.6 kg 71.1 kg 70.2 kg    Intake/Output:   Intake/Output Summary (Last 24 hours) at 01/31/2022 0936 Last data filed at 01/31/2022 0900 Gross per 24 hour  Intake 2281 ml  Output 3558 ml  Net -1277 ml      Physical Exam   CVP 16  General:  fatigued appearing, obese F. No respiratory difficulty HEENT: normal Neck: supple. Short thick  neck, JVD not well visualized.  Carotids 2+ bilat; no bruits. No lymphadenopathy or thyromegaly appreciated. Cor: PMI nondisplaced. Regular rate & rhythm. + TR murmur  Lungs: clear Abdomen: soft, nontender, nondistended. No hepatosplenomegaly. No bruits or masses. Good bowel sounds. Extremities: no cyanosis, clubbing, rash, 1 + b/L LE edema Neuro: alert & oriented x 3, cranial nerves grossly intact. moves all 4 extremities w/o difficulty. Affect pleasant.   Telemetry   NSR 70s personally checked.   EKG    N/A   Labs    CBC Recent Labs    01/30/22 0345 01/30/22 2208 01/31/22 0439  WBC 19.8*  --  15.8*  NEUTROABS 14.4*  --  10.4*  HGB 8.2* 9.5* 8.0*  HCT 26.7* 28.0* 25.9*  MCV 101.5*  --  101.2*  PLT 160  --  185   Basic Metabolic Panel Recent Labs    99/35/70 0345 01/30/22 1750 01/30/22 2208 01/31/22 0439  NA 135 136 137 137  K 4.2 4.0 4.3 4.1  CL 104 104  --  104  CO2 22 25  --  24  GLUCOSE 131* 95  --  155*  BUN 11 11  --  12  CREATININE 0.87 0.95  --  1.11*  CALCIUM 9.4 9.5  --  9.4  MG 2.4  --   --  2.4  PHOS 2.3* 2.0*  --  1.8*   Liver Function Tests Recent Labs    01/30/22 1750 01/31/22 0439  ALBUMIN 2.3* 2.3*   No results for input(s): LIPASE, AMYLASE in the last 72 hours. Cardiac Enzymes No results for input(s): CKTOTAL, CKMB, CKMBINDEX, TROPONINI in the last 72 hours.  BNP: BNP (last 3 results) Recent Labs    12/20/21 2246 01/01/22 0941  BNP 1,753.1* 1,432.4*    ProBNP (last 3 results) No results for input(s): PROBNP in the last 8760 hours.   D-Dimer No results for input(s): DDIMER in the last 72 hours. Hemoglobin A1C No results for input(s): HGBA1C in the last 72 hours. Fasting Lipid Panel No results for input(s): CHOL, HDL, LDLCALC, TRIG, CHOLHDL, LDLDIRECT in the last 72 hours. Thyroid Function Tests No results for input(s): TSH, T4TOTAL, T3FREE, THYROIDAB in the last 72 hours.  Invalid input(s): FREET3  Other  results:   Imaging    No results found.   Medications:     Scheduled Medications:  amiodarone  200 mg Oral BID   aspirin EC  325 mg Oral Daily   Or   aspirin  324 mg Per Tube Daily   chlorhexidine gluconate (MEDLINE KIT)  15 mL Mouth Rinse BID   Chlorhexidine Gluconate Cloth  6 each Topical Daily   darbepoetin (ARANESP) injection - NON-DIALYSIS  100 mcg Subcutaneous Q Sat-1800   doxercalciferol  9 mcg Intravenous Q M,W,F-HD   ezetimibe  10 mg Oral Daily   feeding supplement (PROSource TF)  45 mL Per Tube BID   feeding supplement (VITAL 1.5 CAL)  900 mL Per Tube Q24H   insulin aspart  0-20 Units Subcutaneous Q4H   insulin detemir  15 Units Subcutaneous BID   latanoprost  1 drop Both Eyes QHS   midodrine  15 mg Oral Q6H   multivitamin  1 tablet Per Tube QHS   pantoprazole sodium  40 mg Per Tube Daily   rosuvastatin  10 mg Per Tube QHS   sodium chloride flush  10-40 mL Intracatheter Q12H   sodium chloride flush  3 mL Intravenous Q12H    Infusions:   prismasol BGK 4/2.5 500 mL/hr at 01/31/22 0905    prismasol BGK 4/2.5 500 mL/hr at 01/31/22 0900   sodium chloride     sodium chloride 10 mL/hr at 01/31/22 0900   sodium chloride 10 mL/hr at 01/30/22 1830   sodium chloride 10 mL/hr at 01/31/22 0900   ceFEPime (MAXIPIME) IV Stopped (01/30/22 2237)   epinephrine Stopped (01/30/22 0300)   heparin 1,200 Units/hr (01/31/22 0900)   prismasol BGK 4/2.5 1,200 mL/hr at 01/31/22 0649   vancomycin Stopped (01/30/22 1900)    PRN Medications: sodium chloride, sodium chloride, alteplase, bisacodyl **OR** bisacodyl, docusate, food thickener, Gerhardt's butt cream, heparin, hydrOXYzine, lip balm, ondansetron (ZOFRAN) IV, oxyCODONE, sodium chloride, sodium chloride flush, sodium chloride flush, traMADol    Patient Profile   66 y/o female with obesity, T2DM, previous PE, ESRD and mitral regurgitation, 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 Subsequently followed at [facility]  for mixed PH on macitentan and sildenafil. Admitted 2/5 for CP. LHC showed severe 3v CAD with CTO of RCA & LCX and critical ostial LAD lesion. Initial echo, EF 40-45%, no MR, mod MS, RV mildly reduced w/ severe TR. Underwent CABG w/ PFO closure and tricuspid valve repair/valvuloplasty w/ ring 01/24/2022. Complicated post CABG course with RV failure, GN bacteremia and persistent hypotension and inability to wean off pressors. Nephrology following for CVVH.  Assessment/Plan  1. Post-cardiotomy cardiogenic shock - likely due predominantly to end-stage RV failure. (Required ECMO in 2016 post MVR). Echo 01/15/22 with EF 50-55%, D-shaped septum with severe RV dysfunction and enlargement, s/p MV repair, s/p TV repair but still with severe TR.  - Post op platelets down to 30. Switched to Bival. HIT negative. Plts improved  - RHC 3/1 revealed surprisingly well-compensated hemodynamics on 4 vasopressors/inotropes. CO/CI 6.4/3.8, RA 11, PCW 17, PAPi 2.45  - Milrinone stopped 3/1 w/ low SVR (650)  - Now off IV pressor support. SBPs 90-low 100s, on Midodrine 15 q6h - CI 2.89, PAPi 1.5. CVP 16  - Continue CVVHD for volume removal. With severe RV failure, aim for CVP ~10 - Continue to follow hemodynamics off swan today, if she continues to struggle w/ worsening RV failure off Epi, will need to consider switching to comfort care    2. PAH with cor-pulmonale, end-stage - she clearly has mixed PH (predominantly WHO Groups II-III but perhaps contributions from I & IV) - Hold macitentan. Given her hemodynamics  doubt she will have much to gain from further selective pulmonary artery vasodilators. PVR not elevated.   - BP will not tolerate sildenafil   3. Severe 3vCAD s/p CABG x3 with closure PFO + TV ring on 01/03/2022 - no current s/s angina - continue ASA/Statin   4. ESRD - currently on CVVHD. CVP trending up, 16 today  - w/ severe RV failure, would aim to keep CVP ~10, aim for net negative fluid balance today     5. SVT/possible PAF - maintaining NSR - now off pressors, switch back to PO amio 200 mg bid  - plts recovered. HIT negative.  - Switched from Bival back to heparin 3/2   6. H/O PE - 11/2020 VQ : unchanged left lower lobe apical segment PE - VQ 01/02/22 low prob - Continue heparin gtt    7. H/O MV Ring 2016 - stable on echo   8. Anemia  - Hgb down, 8.5>>7.8>>7.7>>8.1 >>8.1>>8.2>>8.0  - transfuse for hgb < 7.0  - iron stores normal   9. ID - morganella morganii bacteremia: completed abx. BCx 2/18 cleared  - abx reinitiated 3/5 for fever, worsening leukocytosis and PCT 1.05  - AF today, WBC down trending  - treating w/ empiric vancomycin/cefepime x 7 days (end-date 3/12)   10. Severe TR despite TV repair  11. Delirium  - frequent re-orientation   Robbie Lis, PA-C  9:36 AM  Agree with above.   Delirious overnight but mental status now improved. Remains on CVVHD. Off pressors. Hemodynamics marginal. SVR still ~ 800. CVP 16 on high-dose milrinone.   Denies CP or SOB.   General:  Weak appearing. No resp difficulty HEENT: normal Neck: supple. JVP up Carotids 2+ bilat; no bruits. No lymphadenopathy or thryomegaly appreciated. Cor: PMI nondisplaced. Regular rate & rhythm. 2/6 TR Lungs: clear Abdomen: obese soft, nontender, nondistended. No hepatosplenomegaly. No bruits or masses. Good bowel sounds. Extremities: no cyanosis, clubbing, rash, 1+ edema Neuro: alert able to follow commands  She remains extremely tenuous but hemodynamics some what improved. Will leave swan in one more day. Continue to pull with CVVHD. I am very worried she will not tolerate transition to iHD when time comes.   CRITICAL CARE Performed by: Arvilla Meres  Total critical care time: 35 minutes  Critical care time was exclusive of separately billable procedures and treating other patients.  Critical care was necessary to treat or prevent imminent or life-threatening  deterioration.  Critical care was time spent personally by me (independent of midlevel providers or residents) on the following activities: development of treatment plan with patient and/or surrogate as well as nursing, discussions with consultants, evaluation of patient's response to treatment, examination of patient, obtaining history from patient or surrogate, ordering and performing treatments and interventions, ordering and review of laboratory studies, ordering and review of radiographic studies, pulse oximetry and re-evaluation of patient's condition.  Glori Bickers, MD  12:58 PM

## 2022-01-31 NOTE — TOC Initial Note (Signed)
Transition of Care (TOC) - Initial/Assessment Note  ? ? ?Patient Details  ?Name: Jody Nguyen ?MRN: [id number] ?Date of Birth: 1956/07/06 ? ?Transition of Care [facility]) CM/SW Contact:    ?Marcheta Grammes Rexene Alberts, RN ?Phone Number: [phone] ?01/31/2022, 3:41 PM ? ?Clinical Narrative:                 ?HF TOC CM spoke to pt and husband at bedside. Pt is confused and unable to answer questions, but alert and answers to her name. She is agitated by oxygen, but Unit RN encouraging pt to keep in nose. Husband holding pt's hand. Husband states she was driving to her dialysis appts. She uses a RW. Explained waiting PT/OT recommendations. States he is agreeable to SNF vs IP rehab if needed. Explained CM/CSW will continue to follow for dc needs.  ? ? ? ?Expected Discharge Plan: [place] ?Barriers to Discharge: Continued Medical Work up ? ? ?Patient Goals and CMS Choice ?Patient states their goals for this hospitalization and ongoing recovery are:: would like rehab ?CMS Medicare.gov Compare Post Acute Care list provided to:: Patient Represenative (must comment) (husband) ?Choice offered to / list presented to : Spouse ? ?Expected Discharge Plan and Services ?Expected Discharge Plan: [place] ?In-house Referral: Clinical Social Work ?Discharge Planning Services: CM Consult ?Post Acute Care Choice: IP Rehab ?Living arrangements for the past 2 months: [place] ?                ?  ?DME Agency: NA ?  ?  ?  ?  ?  ?  ?  ?  ? ?Prior Living Arrangements/Services ?Living arrangements for the past 2 months: [place] ?Lives with:: Spouse ?Patient language and need for interpreter reviewed:: Yes ?       ?Need for Family Participation in Patient Care: Yes (Comment) ?Care giver support system in place?: Yes (comment) ?Current home services: DME (rolling walker, cane) ?Criminal Activity/Legal Involvement Pertinent to Current Situation/Hospitalization: No - Comment as needed ? ?Activities of Daily Living ?Home  Assistive Devices/Equipment: Blood pressure cuff, CBG Meter, Walker (specify type) ?ADL Screening (condition at time of admission) ?Patient's cognitive ability adequate to safely complete daily activities?: Yes ?Is the patient deaf or have difficulty hearing?: No ?Does the patient have difficulty seeing, even when wearing glasses/contacts?: No ?Does the patient have difficulty concentrating, remembering, or making decisions?: No ?Patient able to express need for assistance with ADLs?: Yes ?Does the patient have difficulty dressing or bathing?: No ?Independently performs ADLs?: Yes (appropriate for developmental age) ?Does the patient have difficulty walking or climbing stairs?: No ?Weakness of Legs: Both ?Weakness of Arms/Hands: None ? ?Permission Sought/Granted ?Permission sought to share information with : Case Manager, Family Supports, PCP ?  ? Share Information with NAME: Janeen Watson ? Permission granted to share info w AGENCY: SNF, IP rehab ? Permission granted to share info w Relationship: husband ? Permission granted to share info w Contact Information: [phone] ? ?Emotional Assessment ?Appearance:: Appears stated age ?Attitude/Demeanor/Rapport: Other (comment) (Unable to answer questions) ?Affect (typically observed): Appropriate ?Orientation: : Oriented to Self ?Alcohol / Substance Use: Not Applicable ?Psych Involvement: No (comment) ? ?Admission diagnosis:  Tachycardia [R00.0] ?End stage renal disease on dialysis ([place]) [N18.6, Z99.2] ?Chest pain [R07.9] ?Chest pain, unspecified type [R07.9] ?Patient Active Problem List  ? Diagnosis Date Noted  ? Pressure injury of skin 02/14/2022  ? Metabolic encephalopathy 00/17/4944  ? Acute hypoxemic respiratory failure ([place])   ? S/P  CABG (coronary artery bypass graft)   ? Gram-negative bacteremia 01/16/2022  ? Hx of CABG 01/16/2022  ? S/P TVR (tricuspid valve repair) 01/16/2022  ? Cardiogenic shock ([place]) 01/16/2022  ? Tachycardia   ? Shock ([place])   ?  Hyponatremia 01/04/2022  ? GERD (gastroesophageal reflux disease) 01/04/2022  ? Coronary artery disease involving native coronary artery of native heart with unstable angina pectoris ([place])   ? SVT (supraventricular tachycardia) ([place]) 01/02/2022  ? History of stroke 01/01/2022  ? QT prolongation 01/01/2022  ? Chest pain, Multivessel CAD 08/28/2021  ? Patellofemoral arthritis of left knee 04/21/2021  ? 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 Secondary renal hyperparathyroidism ([place]) 07/28/2019  ? Major depression in remission ([place]) 01/22/2019  ? End stage renal disease on dialysis ([place]) 10/09/2015  ? Cardiomyopathy ([place]) 09/10/2015  ? Atrial fibrillation ([place]) 09/10/2015  ? Chronic periodontitis 06/30/2015  ? Chronic combined systolic and diastolic heart failure ([place])   ? Pulmonary hypertension ([place]) 06/18/2015  ? Obesity (BMI 30-39.9) 03/29/2015  ? Type 2 diabetes mellitus ([place]) 11/22/2014  ? Vitamin D deficiency 03/25/2014  ? Anemia of chronic renal failure   ? Essential hypertension 08/17/2011  ? Hyperlipidemia associated with type 2 diabetes mellitus ([place]) 08/17/2011  ? ?PCP:  Unk Pinto, MD ?Pharmacy:   ?[facility] [place], [place] ?[place] ?[place] [address] ?Phone: [phone] Fax: [phone] ? ?[facility] ([place]), [place] - [address] ?[address] ?[place] ([place])  [address] ?Phone: [phone] Fax: [phone] ? ?[id number] - [place], [place] - [address] ?[address] ?[place] [address] ?Phone: [phone] Fax: [phone] ? ?[facility] ([place] [place] ?[place] ?[address] ?[place] [address] ?Phone: [phone] Fax: [phone] ? ? ? ? ?Social Determinants of Health (SDOH) Interventions ?  ? ?Readmission Risk Interventions ?No flowsheet data found. ? ? ?

## 2022-02-01 ENCOUNTER — Encounter (HOSPITAL_COMMUNITY): Payer: Self-pay | Admitting: Internal Medicine

## 2022-02-01 DIAGNOSIS — R570 Cardiogenic shock: Secondary | ICD-10-CM | POA: Diagnosis not present

## 2022-02-01 DIAGNOSIS — J9601 Acute respiratory failure with hypoxia: Secondary | ICD-10-CM | POA: Diagnosis not present

## 2022-02-01 DIAGNOSIS — R072 Precordial pain: Secondary | ICD-10-CM | POA: Diagnosis not present

## 2022-02-01 LAB — CBC WITH DIFFERENTIAL/PLATELET
Abs Immature Granulocytes: 0.12 10*3/uL — ABNORMAL HIGH (ref 0.00–0.07)
Basophils Absolute: 0.2 10*3/uL — ABNORMAL HIGH (ref 0.0–0.1)
Basophils Relative: 1 %
Eosinophils Absolute: 2.4 10*3/uL — ABNORMAL HIGH (ref 0.0–0.5)
Eosinophils Relative: 15 %
HCT: 26.4 % — ABNORMAL LOW (ref 36.0–46.0)
Hemoglobin: 7.9 g/dL — ABNORMAL LOW (ref 12.0–15.0)
Immature Granulocytes: 1 %
Lymphocytes Relative: 10 %
Lymphs Abs: 1.6 10*3/uL (ref 0.7–4.0)
MCH: 30.6 pg (ref 26.0–34.0)
MCHC: 29.9 g/dL — ABNORMAL LOW (ref 30.0–36.0)
MCV: 102.3 fL — ABNORMAL HIGH (ref 80.0–100.0)
Monocytes Absolute: 1.4 10*3/uL — ABNORMAL HIGH (ref 0.1–1.0)
Monocytes Relative: 9 %
Neutro Abs: 9.9 10*3/uL — ABNORMAL HIGH (ref 1.7–7.7)
Neutrophils Relative %: 64 %
Platelets: 199 10*3/uL (ref 150–400)
RBC: 2.58 MIL/uL — ABNORMAL LOW (ref 3.87–5.11)
RDW: 23.3 % — ABNORMAL HIGH (ref 11.5–15.5)
WBC: 15.6 10*3/uL — ABNORMAL HIGH (ref 4.0–10.5)
nRBC: 4.2 % — ABNORMAL HIGH (ref 0.0–0.2)

## 2022-02-01 LAB — RENAL FUNCTION PANEL
Albumin: 2.3 g/dL — ABNORMAL LOW (ref 3.5–5.0)
Anion gap: 10 (ref 5–15)
BUN: 25 mg/dL — ABNORMAL HIGH (ref 8–23)
CO2: 23 mmol/L (ref 22–32)
Calcium: 9.7 mg/dL (ref 8.9–10.3)
Chloride: 102 mmol/L (ref 98–111)
Creatinine, Ser: 2.13 mg/dL — ABNORMAL HIGH (ref 0.44–1.00)
GFR, Estimated: 25 mL/min — ABNORMAL LOW (ref >60)
Glucose, Bld: 151 mg/dL — ABNORMAL HIGH (ref 70–99)
Phosphorus: 2.9 mg/dL (ref 2.5–4.6)
Potassium: 4.5 mmol/L (ref 3.5–5.1)
Sodium: 135 mmol/L (ref 135–145)

## 2022-02-01 LAB — GLUCOSE, CAPILLARY
Glucose-Capillary: 155 mg/dL — ABNORMAL HIGH (ref 70–99)
Glucose-Capillary: 137 mg/dL — ABNORMAL HIGH (ref 70–99)
Glucose-Capillary: 85 mg/dL (ref 70–99)
Glucose-Capillary: 83 mg/dL (ref 70–99)
Glucose-Capillary: 141 mg/dL — ABNORMAL HIGH (ref 70–99)
Glucose-Capillary: 95 mg/dL (ref 70–99)
Glucose-Capillary: 145 mg/dL — ABNORMAL HIGH (ref 70–99)

## 2022-02-01 LAB — HEPARIN LEVEL (UNFRACTIONATED)
Heparin Unfractionated: 0.51 IU/mL (ref 0.30–0.70)
Heparin Unfractionated: 0.46 IU/mL (ref 0.30–0.70)

## 2022-02-01 LAB — VANCOMYCIN, RANDOM: Vancomycin Rm: 21

## 2022-02-01 MED ORDER — HEPARIN SODIUM (PORCINE) 1000 UNIT/ML IJ SOLN
3000.0000 [IU] | Freq: Once | INTRAMUSCULAR | Status: AC
  Administered 2022-02-01: 2800 [IU] via INTRAVENOUS
  Filled 2022-02-01: qty 3

## 2022-02-01 MED ORDER — CHLORHEXIDINE GLUCONATE 0.12 % MT SOLN
OROMUCOSAL | Status: AC
  Administered 2022-02-01: 15 mL via OROMUCOSAL
  Filled 2022-02-01: qty 15

## 2022-02-01 MED ORDER — INSULIN DETEMIR 100 UNIT/ML ~~LOC~~ SOLN
12.0000 [IU] | Freq: Two times a day (BID) | SUBCUTANEOUS | Status: DC
  Administered 2022-02-01 – 2022-02-04 (×5): 12 [IU] via SUBCUTANEOUS
  Filled 2022-02-01 (×11): qty 0.12

## 2022-02-01 NOTE — Progress Notes (Deleted)
Office Visit    Patient Name: Jody Nguyen Date of Encounter: 02/01/2022  Primary Care Provider:  Lucky Cowboy, MD Primary Cardiologist:  Olga Millers, MD  Chief Complaint    ***  Past Medical History    Past Medical History:  Diagnosis Date   Anemia    Chronic diastolic CHF (congestive heart failure) (HCC)    Chronic diastolic heart failure (HCC)    CKD (chronic kidney disease) stage 4, GFR 15-29 ml/min (HCC)    dialysis M/W/F   Constipation    CVA (cerebral infarction) 1997   no residual deficit   Diverticulitis    DM (diabetes mellitus) (HCC)    type 2   Heart murmur    History of cardiovascular stress test    a. Myoview Oct 2012 showed EF 49%, no ischemia, LVE   HLD (hyperlipidemia)    takes Crestor daily   Hypertension    Pericardial effusion    chronic; felt to be poss related to minoxidil >> DC'd   Pulmonary hypertension (HCC) 06/18/2015   Respiratory failure, acute hypoxic, post-operative 07/08/2015   Requiring ECMO support   Respiratory failure, post-operative (HCC)    S/P cardiac catheterization    a. R/L HC 06/18/15:  mLAD 30%; severe pulmo HTN with PA sat 43%, CI 1.86, prominent V waves indicative of MR; resting hypoxemia O2 sat 86% on RA   S/P minimally invasive mitral valve repair 07/08/2015   Complex valvuloplasty including artificial Gore-tex neochord placement x10 and 26 mm Sorin Memo 3D Rechord ring annuloplasty via right mini thoracotomy approach   S/P minimally invasive mitral valve repair 07/08/2015   Complex valvuloplasty including artificial Gore-tex neochord placement x10 and 26 mm Sorin Memo 3D Rechord ring annuloplasty via right mini thoracotomy approach   Severe mitral regurgitation    Shortness of breath dyspnea    Stroke (HCC) 1997   no residual effect   Vitamin D deficiency    Wears glasses    Past Surgical History:  Procedure Laterality Date   A/V FISTULAGRAM Right 08/16/2017   Procedure: A/V Fistulagram;  Surgeon: Fransisco Hertz, MD;  Location: [facility] INVASIVE CV LAB;  Service: Cardiovascular;  Laterality: Right;   AV FISTULA PLACEMENT Left 10/22/2015   Procedure: RADIOCEPHALIC ARTERIOVENOUS (AV) FISTULA CREATION;  Surgeon: Nada Libman, MD;  Location: [facility] OR;  Service: Vascular;  Laterality: Left;   AV FISTULA PLACEMENT Right 05/22/2017   Procedure: ARTERIOVENOUS (AV) FISTULA CREATION-RIGHT ARM;  Surgeon: Maeola Harman, MD;  Location: [facility] OR;  Service: Vascular;  Laterality: Right;   CANNULATION FOR CARDIOPULMONARY BYPASS N/A 07/08/2015   Procedure: CANNULATION FOR ECMO;  Surgeon: Purcell Nails, MD;  Location: [facility] OR;  Service: Open Heart Surgery;  Laterality: N/A;   CARDIAC CATHETERIZATION     CARDIAC CATHETERIZATION N/A 06/18/2015   Procedure: Right/Left Heart Cath and Coronary Angiography;  Surgeon: Corky Crafts, MD;  Location: [facility] INVASIVE CV LAB;  Service: Cardiovascular;  Laterality: N/A;   CESAREAN SECTION     x 2   COLONOSCOPY     CORONARY ARTERY BYPASS GRAFT N/A 01/11/2022   Procedure: CORONARY ARTERY BYPASS GRAFTING (CABG) TIMES THREE ON CARDIOPULMONARY BYPASS. LIMA TO LAD, SVG TO PD, SVG TO DIAG;  Surgeon: Loreli Slot, MD;  Location: [facility] OR;  Service: Open Heart Surgery;  Laterality: N/A;   ENDOVEIN HARVEST OF GREATER SAPHENOUS VEIN Right 01/11/2022   Procedure: ENDOVEIN HARVEST OF GREATER SAPHENOUS VEIN;  Surgeon: Loreli Slot, MD;  Location:  [place] OR;  Service: Open Heart Surgery;  Laterality: Right;  RIGHT UPPER AND LOWER LEG   EXCHANGE OF A DIALYSIS CATHETER N/A 06/28/2017   Procedure: EXCHANGE OF A DIALYSIS CATHETER;  Surgeon: Waynetta Sandy, MD;  Location: [place];  Service: Vascular;  Laterality: N/A;   FISTULA SUPERFICIALIZATION Left 12/28/2015   Procedure: SUPERFICIALIZATION LEFT RADIOCEPHALIC FISTULA;  Surgeon: Serafina Mitchell, MD;  Location: [place];  Service: Vascular;  Laterality: Left;   FISTULA SUPERFICIALIZATION Right 08/21/2017   Procedure: FISTULA  SUPERFICIALIZATION RIGHT ARM;  Surgeon: Waynetta Sandy, MD;  Location: [place];  Service: Vascular;  Laterality: Right;   INSERTION OF DIALYSIS CATHETER Right 04/28/2017   Procedure: INSERTION OF DIALYSIS CATHETER-RIGHT INTERNAL JUGULAR PLACEMENT;  Surgeon: Elam Dutch, MD;  Location: [place];  Service: Vascular;  Laterality: Right;   LEFT HEART CATH AND CORONARY ANGIOGRAPHY N/A 01/16/2022   Procedure: LEFT HEART CATH AND CORONARY ANGIOGRAPHY;  Surgeon: Troy Sine, MD;  Location: [place] CV LAB;  Service: Cardiovascular;  Laterality: N/A;   LIGATION OF ARTERIOVENOUS  FISTULA Left 04/28/2017   Procedure: LIGATION OF ARTERIOVENOUS  FISTULA;  Surgeon: Elam Dutch, MD;  Location: [place];  Service: Vascular;  Laterality: Left;   MITRAL VALVE REPAIR Right 07/08/2015   Procedure: MINIMALLY INVASIVE MITRAL VALVE REPAIR with a 26 Sorin Memo 3D Rechord;  Surgeon: Rexene Alberts, MD;  Location: [place];  Service: Open Heart Surgery;  Laterality: Right;   MULTIPLE EXTRACTIONS WITH ALVEOLOPLASTY N/A 06/30/2015   Procedure: MULTIPLE EXTRACTIONS OF TOOTH #'S 4  AND 30 WITH ALVEOLOPLASTY AND GROSS DEBRIDEMENT  OF REMAINING TEETH;  Surgeon: Lenn Cal, DDS;  Location: [place];  Service: Oral Surgery;  Laterality: N/A;   PERIPHERAL VASCULAR CATHETERIZATION Left 03/28/2016   Procedure: A/V Fistulagram;  Surgeon: Serafina Mitchell, MD;  Location: [place] CV LAB;  Service: Cardiovascular;  Laterality: Left;  lower arm   REPAIR OF PATENT FORAMEN OVALE N/A 01/13/2022   Procedure: REPAIR OF PATENT FORAMEN OVALE;  Surgeon: [name], MD;  Location: [place];  Service: Open Heart Surgery;  Laterality: N/A;   RIGHT HEART CATH N/A 02/13/2022   Procedure: RIGHT HEART CATH;  Surgeon: Jolaine Artist, MD;  Location: [place] CV LAB;  Service: Cardiovascular;  Laterality: N/A;   TEE WITHOUT CARDIOVERSION N/A 06/08/2015   Procedure: TRANSESOPHAGEAL ECHOCARDIOGRAM (TEE);  Surgeon: Lelon Perla,  MD;  Location: [facility] ENDOSCOPY;  Service: Cardiovascular;  Laterality: N/A;   TEE WITHOUT CARDIOVERSION N/A 07/08/2015   Procedure: TRANSESOPHAGEAL ECHOCARDIOGRAM (TEE);  Surgeon: Rexene Alberts, MD;  Location: [place];  Service: Open Heart Surgery;  Laterality: N/A;   TEE WITHOUT CARDIOVERSION N/A 01/03/2022   Procedure: TRANSESOPHAGEAL ECHOCARDIOGRAM (TEE);  Surgeon: [name], MD;  Location: [place];  Service: Open Heart Surgery;  Laterality: N/A;   TRICUSPID VALVE REPLACEMENT N/A 01/16/2022   Procedure: TRICUSPID VALVE REPAIR USING AN 36MM EDWARDS MC3 TRICUSPID ANNULOPLASTY RING;  Surgeon: [name], MD;  Location: [place];  Service: Open Heart Surgery;  Laterality: N/A;    Allergies  Allergies  Allergen Reactions   Minoxidil Other (See Comments)    Pericardial effusion   Lipitor [Atorvastatin] Other (See Comments)    MYALGIAS > "pain in legs" Tolerates rosuvastatin   Morphine And Related Itching   Ace Inhibitors Other (See Comments)    REACTION: "not sure...think it made me drowsy all the time"    History of Present Illness    ***  Home Medications    No current facility-administered medications for this visit.   No current outpatient medications on file.   Facility-Administered Medications Ordered in Other Visits  Medication Dose Route Frequency Provider Last Rate Last Admin   0.9 %  sodium chloride infusion (Manually program via Guardrails IV Fluids)   Intravenous Continuous Bensimhon, Shaune Pascal, MD       0.9 %  sodium chloride infusion (Manually program via Guardrails IV Fluids)   Intravenous PRN Bensimhon, Shaune Pascal, MD       0.9 %  sodium chloride infusion   Intravenous PRN Bensimhon, Shaune Pascal, MD   Stopped at 02/01/22 0353   0.9 %  sodium chloride infusion   Intravenous Continuous Bensimhon, Shaune Pascal, MD 10 mL/hr at 01/31/22 2057 New Bag at 01/31/22 2057   0.9 %  sodium chloride infusion   Intravenous Continuous Bensimhon, Shaune Pascal, MD   Stopped at  01/31/22 2057   amiodarone (PACERONE) tablet 200 mg  200 mg Oral BID [name], MD   200 mg at 02/01/22 0901   aspirin EC tablet 325 mg  325 mg Oral Daily Bensimhon, Shaune Pascal, MD       Or   aspirin chewable tablet 324 mg  324 mg Per Tube Daily Bensimhon, Shaune Pascal, MD   324 mg at 02/01/22 0901   bisacodyl (DULCOLAX) EC tablet 10 mg  10 mg Oral Daily PRN Candee Furbish, MD       Or   bisacodyl (DULCOLAX) suppository 10 mg  10 mg Rectal Daily PRN Candee Furbish, MD       ceFEPIme (MAXIPIME) 1 g in sodium chloride 0.9 % 100 mL IVPB  1 g Intravenous Q24H Einar Grad, RPH       chlorhexidine gluconate (MEDLINE KIT) (PERIDEX) 0.12 % solution 15 mL  15 mL Mouth Rinse BID Bensimhon, Shaune Pascal, MD   15 mL at 02/01/22 5638   Chlorhexidine Gluconate Cloth 2 % PADS 6 each  6 each Topical Daily Bensimhon, Shaune Pascal, MD   6 each at 02/01/22 1327   Darbepoetin Alfa (ARANESP) injection 100 mcg  100 mcg Subcutaneous Q Sat-1800 Bensimhon, Shaune Pascal, MD   100 mcg at 01/28/22 1744   docusate (COLACE) 50 MG/5ML liquid 200 mg  200 mg Per Tube Daily PRN Candee Furbish, MD       doxercalciferol (HECTOROL) injection 9 mcg  9 mcg Intravenous Q M,W,F-HD Bensimhon, Shaune Pascal, MD   9 mcg at 02/01/22 0948   ezetimibe (ZETIA) tablet 10 mg  10 mg Oral Daily Lyda Jester M, PA-C   10 mg at 02/01/22 0901   feeding supplement (PROSource TF) liquid 45 mL  45 mL Per Tube BID Bensimhon, Shaune Pascal, MD   45 mL at 02/01/22 0901   feeding supplement (VITAL 1.5 CAL) liquid 600 mL  600 mL Per Tube Q24H Candee Furbish, MD   Stopped at 02/01/22 0600   food thickener (SIMPLYTHICK (HONEY/LEVEL 3/MODERATELY THICK)) 10 packet  10 packet Oral PRN Bensimhon, Shaune Pascal, MD   1 packet at 01/28/22 0444   Gerhardt's butt cream   Topical PRN Julian Hy, DO   Given at 01/30/22 2234   heparin ADULT infusion 100 units/mL (25000 units/288mL)  1,150 Units/hr Intravenous Continuous Candee Furbish, MD 11.5 mL/hr at 02/01/22 1100  1,150 Units/hr at 02/01/22 1100   hydrOXYzine (ATARAX) 10 MG/5ML syrup 10 mg  10 mg Per Tube Q6H PRN Bensimhon, Shaune Pascal,  MD   10 mg at 01/31/22 2216   insulin aspart (novoLOG) injection 0-20 Units  0-20 Units Subcutaneous Q4H Bensimhon, Shaune Pascal, MD   3 Units at 02/01/22 0848   insulin detemir (LEVEMIR) injection 12 Units  12 Units Subcutaneous BID Candee Furbish, MD   12 Units at 02/01/22 1052   latanoprost (XALATAN) 0.005 % ophthalmic solution 1 drop  1 drop Both Eyes QHS Bensimhon, Shaune Pascal, MD   1 drop at 01/31/22 2216   lip balm (CARMEX) ointment   Topical PRN Candee Furbish, MD   75 application. at 01/29/22 1949   midodrine (PROAMATINE) tablet 15 mg  15 mg Oral Q6H Candee Furbish, MD   15 mg at 02/01/22 1544   multivitamin (RENA-VIT) tablet 1 tablet  1 tablet Per Tube QHS Bensimhon, Shaune Pascal, MD   1 tablet at 01/31/22 2215   ondansetron [facility]) injection 4 mg  4 mg Intravenous Q6H PRN Bensimhon, Shaune Pascal, MD   4 mg at 01/31/22 0007   oxyCODONE (Oxy IR/ROXICODONE) immediate release tablet 5-10 mg  5-10 mg Per Tube Q3H PRN Bensimhon, Shaune Pascal, MD   10 mg at 02/01/22 0336   pantoprazole sodium (PROTONIX) 40 mg/20 mL oral suspension 40 mg  40 mg Per Tube Daily Bensimhon, Shaune Pascal, MD   40 mg at 02/01/22 0855   rosuvastatin (CRESTOR) tablet 10 mg  10 mg Per Tube QHS Noemi Chapel P, DO   10 mg at 01/31/22 2215   sodium chloride flush (NS) 0.9 % injection 10-40 mL  10-40 mL Intracatheter Q12H Bensimhon, Shaune Pascal, MD   10 mL at 02/01/22 0903   sodium chloride flush (NS) 0.9 % injection 10-40 mL  10-40 mL Intracatheter PRN Bensimhon, Shaune Pascal, MD       sodium chloride flush (NS) 0.9 % injection 3 mL  3 mL Intravenous Q12H Bensimhon, Shaune Pascal, MD   3 mL at 02/01/22 4709   sodium chloride flush (NS) 0.9 % injection 3 mL  3 mL Intravenous PRN Bensimhon, Shaune Pascal, MD       traMADol Veatrice Bourbon) tablet 50-100 mg  50-100 mg Per Tube Q12H PRN Bensimhon, Shaune Pascal, MD   100 mg at 01/31/22 0102   vancomycin  variable dose per unstable renal function (pharmacist dosing)   Does not apply See admin instructions Einar Grad, [facility]         Review of Systems    ***.  All other systems reviewed and are otherwise negative except as noted above.    Physical Exam    VS:  There were no vitals taken for this visit. , BMI There is no height or weight on file to calculate BMI.     GEN: Well nourished, well developed, in no acute distress. HEENT: normal. Neck: Supple, no JVD, carotid bruits, or masses. Cardiac: RRR, no murmurs, rubs, or gallops. No clubbing, cyanosis, edema.  Radials/DP/PT 2+ and equal bilaterally.  Respiratory:  Respirations regular and unlabored, clear to auscultation bilaterally. GI: Soft, nontender, nondistended, BS + x 4. MS: no deformity or atrophy. Skin: warm and dry, no rash. Neuro:  Strength and sensation are intact. Psych: Normal affect.  Accessory Clinical Findings    ECG personally reviewed by me today - *** - no acute changes.  Lab Results  Component Value Date   WBC 15.6 (H) 02/01/2022   HGB 7.9 (L) 02/01/2022   HCT 26.4 (L) 02/01/2022   MCV 102.3 (H) 02/01/2022   PLT 199  02/01/2022   Lab Results  Component Value Date   CREATININE 2.13 (H) 02/01/2022   BUN 25 (H) 02/01/2022   NA 135 02/01/2022   K 4.5 02/01/2022   CL 102 02/01/2022   CO2 23 02/01/2022   Lab Results  Component Value Date   ALT 78 (H) 01/23/2022   AST 99 (H) 01/23/2022   ALKPHOS 111 01/23/2022   BILITOT 0.6 01/23/2022   Lab Results  Component Value Date   CHOL 227 (H) 12/08/2021   HDL 50 12/08/2021   LDLCALC 155 (H) 12/08/2021   TRIG 103 12/08/2021   CHOLHDL 4.5 12/08/2021    Lab Results  Component Value Date   HGBA1C 5.6 01/05/2022    Assessment & Plan    1.  ***   Lenna Sciara, NP 02/01/2022, 4:03 PM

## 2022-02-01 NOTE — Progress Notes (Signed)
Pharmacy Antibiotic Note ? ?Jody Nguyen is a 66 y.o. female admitted on 01/11/2022 with Morganella bacteremia and treated with Zosyn - completed 2/26. Vancomycin and cefepime started for increased WBC count on 3/5. Pt has been maintained on CRRT, and transition to iHD on 3/8.  ? ?VR 2 hours post-HD was 21. No re-dose needed. ? ?Plan: ?Continue cefepime to 1g IV q24h (HD dosing) ?Hold vancomycin, check levels as needed s/p HD ? ?Height: 4' 11.5" (151.1 cm) ?Weight: 70.1 kg (154 lb 8.7 oz) ?IBW/kg (Calculated) : 44.35 ? ?Temp (24hrs), Avg:98.6 ?F (37 ?C), Min:97.5 ?F (36.4 ?C), Max:99.5 ?F (37.5 ?C) ? ?Recent Labs  ?Lab 01/28/22 ?1657 01/28/22 ?1610 01/29/22 ?0345 01/29/22 ?1518 01/30/22 ?0345 01/30/22 ?1750 01/31/22 ?0439 02/01/22 ?9038 02/01/22 ?1345  ?WBC 17.8*  --  21.4*  --  19.8*  --  15.8* 15.6*  --   ?CREATININE 1.06*   < > 1.12* 0.97 0.87 0.95 1.11* 2.13*  --   ?VANCORANDOM  --   --   --   --   --   --   --   --  21  ? < > = values in this interval not displayed.  ? ?  ?Estimated Creatinine Clearance: 22.4 mL/min (A) (by C-G formula based on SCr of 2.13 mg/dL (H)).   ? ?Allergies  ?Allergen Reactions  ? Minoxidil Other (See Comments)  ?  Pericardial effusion  ? Lipitor [Atorvastatin] Other (See Comments)  ?  MYALGIAS > "pain in legs" ?Tolerates rosuvastatin  ? Morphine And Related Itching  ? Ace Inhibitors Other (See Comments)  ?  REACTION: "not sure...think it made me drowsy all the time"  ? ?Vancomycin 2/17 > 2/18  resume 3/5>(3/11) ?Cefepime 3/5. (3/11) ?Zosyn 2/17 > 2/26 ? ?2/17 BCx x 2 > morganella - S to zosyn ?2/17 MRSA PCR neg ? ?Cathrine Muster, PharmD ?[id number] Cardiology Pharmacy Resident ?Phone: [phone] ?02/01/2022  3:44 PM ? ?Please check [email or web address] for unit-specific pharmacy phone numbers. ? ? ? ?

## 2022-02-01 NOTE — Progress Notes (Signed)
eLink Physician-Brief Progress Note ?Patient Name: Jody Nguyen ?DOB: 05-01-1956 ?MRN: [id number] ? ? ?Date of Service ? 02/01/2022  ?HPI/Events of Note ? Nursing request for renal function panel in AM.  ?eICU Interventions ? Will order renal function panel at 5 AM.  ? ? ? ?Intervention Category ?Major Interventions: Other: ? ?Quintasia Theroux Cornelia Copa ?02/01/2022, 3:32 AM ?

## 2022-02-01 NOTE — Progress Notes (Signed)
7 Days Post-Op Procedure(s) (LRB): ?RIGHT HEART CATH (N/A) ?Subjective: ?Alert, not as interactive today ? ?Objective: ?Vital signs in last 24 hours: ?Temp:  [97.7 ?F (36.5 ?C)-100.2 ?F (37.9 ?C)] 98.6 ?F (37 ?C) (03/08 0700) ?Pulse Rate:  [77-86] 83 (03/08 0700) ?Cardiac Rhythm: Normal sinus rhythm (03/07 2000) ?Resp:  [10-32] 15 (03/08 0700) ?SpO2:  [90 %-100 %] 98 % (03/08 0700) ?Arterial Line BP: (91-115)/(37-54) 102/47 (03/08 0700) ?Weight:  [70.5 kg] 70.5 kg (03/08 0600) ? ?Hemodynamic parameters for last 24 hours: ?PAP: (43-53)/(20-30) 51/27 ?CVP:  [10 mmHg-20 mmHg] 17 mmHg ?CO:  [4.2 L/min-13 L/min] 4.9 L/min ?CI:  [2.5 L/min/m2-3 L/min/m2] 3 L/min/m2 ? ?Intake/Output from previous day: ?03/07 0701 - 03/08 0700 ?In: 7425 [I.V.:605; NG/GT:630; IV Piggyback:250] ?Out: 902 [Stool:300] ?Intake/Output this shift: ?No intake/output data recorded. ? ?General appearance: alert, cooperative, and no distress ?Neurologic: nonfocal ?Heart: regular rate and rhythm ?Lungs: clear to auscultation bilaterally ?Wound: clean and dry ? ?Lab Results: ?Recent Labs  ?  01/31/22 ?9563 02/01/22 ?8756  ?WBC 15.8* 15.6*  ?HGB 8.0* 7.9*  ?HCT 25.9* 26.4*  ?PLT 185 199  ? ?BMET:  ?Recent Labs  ?  01/31/22 ?0439 02/01/22 ?4332  ?NA 137 135  ?K 4.1 4.5  ?CL 104 102  ?CO2 24 23  ?GLUCOSE 155* 151*  ?BUN 12 25*  ?CREATININE 1.11* 2.13*  ?CALCIUM 9.4 9.7  ?  ?PT/INR: No results for input(s): LABPROT, INR in the last 72 hours. ?ABG ?   ?Component Value Date/Time  ? PHART 7.402 01/30/2022 2208  ? HCO3 27.0 01/30/2022 2208  ? TCO2 28 01/30/2022 2208  ? ACIDBASEDEF 1.0 01/20/2022 1944  ? O2SAT 99 01/30/2022 2208  ? ?CBG (last 3)  ?Recent Labs  ?  01/31/22 ?2033 01/31/22 ?2357 02/01/22 ?0342  ?GLUCAP 72 131* 155*  ? ? ?Assessment/Plan: ?S/P Procedure(s) (LRB): ?RIGHT HEART CATH (N/A) ?- ?CV- remains in SR, off pressors ? PAP up at 55/30, CI= 2.7, CVP 17 ? On midodrine ?RESP- IS, good oxygenation on 3L [place] ?RENAL- CVVH stopped yesterday ? For trial  of HD this AM ? If she can tolerate hopefully can get groin lines out ?ENDO- continue levemir + SSI ?GI- tolerating TF, PO intake fair ?Deconditioning severe ?Anticoagulation- on heparin drip ?ID- afebrile, WBC stable at 15 K ? On vanco, maxipime ? ? LOS: 31 days  ? ? ?Jody Nguyen ?02/01/2022 ? ? ?

## 2022-02-01 NOTE — Progress Notes (Signed)
? ?   ?  [address] ?      [place] [address] ?            [phone]   ? ?  ?Sleeping ? ?BP (!) 75/53 Comment: following aline  Pulse 86   Temp 97.8 ?F (36.6 ?C) (Oral)   Resp (!) 24   Ht 4' 11.5" (1.511 m)   Wt 70.1 kg   SpO2 96%   BMI 30.69 kg/m?  ? ?Only 500 ml off with HD ? ?For tunneled HD cath tomorrow ? ?Revonda Standard Roxan Hockey, MD ?[facility] ?[phone] ? ?

## 2022-02-01 NOTE — Progress Notes (Signed)
Advanced Heart Failure Rounding Note  PCP-Cardiologist: [name], MD   Subjective:    3/1: RHC with swan placement (on NE 2, EPi 3, Milrinone 0.25 and VP 0.02) w/ surprisingly well-compensated hemodynamics (see data below). Milrinone discontinued given low SVR 3/5: Empiric vancomycin/cefepime started for fever, temp 100, rising WBCs and PCT (1.05)   Remains off pressors.  Now on iHD and tolerating. Denies CP or SOB> Feels weak.   PAP: (42-55)/(20-30) 45/23 CVP:  [10 mmHg-21 mmHg] 13 mmHg CO:  [4.2 L/min-13 L/min] 4.9 L/min CI:  [2.5 L/min/m2-3 L/min/m2] 3 L/min/m2    Objective:   Weight Range: 70.5 kg Body mass index is 30.87 kg/m.   Vital Signs:   Temp:  [97.7 F (36.5 C)-100.2 F (37.9 C)] 97.9 F (36.6 C) (03/08 0815) Pulse Rate:  [77-86] 82 (03/08 0800) Resp:  [10-32] 19 (03/08 0815) SpO2:  [90 %-100 %] 94 % (03/08 0800) Arterial Line BP: (86-115)/(37-54) 90/40 (03/08 0815) Weight:  [70.5 kg] 70.5 kg (03/08 0730) Last BM Date :  (3/7)  Weight change: Filed Weights   01/31/22 0500 02/01/22 0600 02/01/22 0730  Weight: 70.2 kg 70.5 kg 70.5 kg    Intake/Output:   Intake/Output Summary (Last 24 hours) at 02/01/2022 0826 Last data filed at 02/01/2022 0800 Gross per 24 hour  Intake 1500.16 ml  Output 771 ml  Net 729.16 ml       Physical Exam   CVP 12 General:  Lying in bed. Weak appearing. No resp difficulty HEENT: normal + cor-trak Neck: supple. JVP up Carotids 2+ bilat; no bruits. No lymphadenopathy or thryomegaly appreciated. Cor: PMI nondisplaced. Regular rate & rhythm. 2/6 TR Lungs: clear Abdomen: obese soft, nontender, nondistended. No hepatosplenomegaly. No bruits or masses. Good bowel sounds. Extremities: no cyanosis, clubbing, rash, edema RFV HD cath [facility] and TL Neuro: alert & orientedx3, cranial nerves grossly intact. moves all 4 extremities w/o difficulty. Affect pleasant    Telemetry   NSR 70-80s personally checked.   EKG     N/A   Labs    CBC Recent Labs    01/31/22 0439 02/01/22 0344  WBC 15.8* 15.6*  NEUTROABS 10.4* 9.9*  HGB 8.0* 7.9*  HCT 25.9* 26.4*  MCV 101.2* 102.3*  PLT 185 211    Basic Metabolic Panel Recent Labs    01/30/22 0345 01/30/22 1750 01/31/22 0439 02/01/22 0344  NA 135   < > 137 135  K 4.2   < > 4.1 4.5  CL 104   < > 104 102  CO2 22   < > 24 23  GLUCOSE 131*   < > 155* 151*  BUN 11   < > 12 25*  CREATININE 0.87   < > 1.11* 2.13*  CALCIUM 9.4   < > 9.4 9.7  MG 2.4  --  2.4  --   PHOS 2.3*   < > 1.8* 2.9   < > = values in this interval not displayed.    Liver Function Tests Recent Labs    01/31/22 0439 02/01/22 0344  ALBUMIN 2.3* 2.3*    No results for input(s): LIPASE, AMYLASE in the last 72 hours. Cardiac Enzymes No results for input(s): CKTOTAL, CKMB, CKMBINDEX, TROPONINI in the last 72 hours.  BNP: BNP (last 3 results) Recent Labs    12/20/21 2246 01/01/22 0941  BNP 1,753.1* 1,432.4*     ProBNP (last 3 results) No results for input(s): PROBNP in the last 8760 hours.   D-Dimer No  results for input(s): DDIMER in the last 72 hours. Hemoglobin A1C No results for input(s): HGBA1C in the last 72 hours. Fasting Lipid Panel No results for input(s): CHOL, HDL, LDLCALC, TRIG, CHOLHDL, LDLDIRECT in the last 72 hours. Thyroid Function Tests No results for input(s): TSH, T4TOTAL, T3FREE, THYROIDAB in the last 72 hours.  Invalid input(s): FREET3  Other results:   Imaging    No results found.   Medications:     Scheduled Medications:  amiodarone  200 mg Oral BID   aspirin EC  325 mg Oral Daily   Or   aspirin  324 mg Per Tube Daily   chlorhexidine gluconate (MEDLINE KIT)  15 mL Mouth Rinse BID   Chlorhexidine Gluconate Cloth  6 each Topical Daily   darbepoetin (ARANESP) injection - NON-DIALYSIS  100 mcg Subcutaneous Q Sat-1800   doxercalciferol  9 mcg Intravenous Q M,W,F-HD   ezetimibe  10 mg Oral Daily   feeding supplement (PROSource  TF)  45 mL Per Tube BID   feeding supplement (VITAL 1.5 CAL)  600 mL Per Tube Q24H   heparin sodium (porcine)  3,000 Units Intravenous Once   insulin aspart  0-20 Units Subcutaneous Q4H   insulin detemir  12 Units Subcutaneous BID   latanoprost  1 drop Both Eyes QHS   midodrine  15 mg Oral Q6H   multivitamin  1 tablet Per Tube QHS   pantoprazole sodium  40 mg Per Tube Daily   rosuvastatin  10 mg Per Tube QHS   sodium chloride flush  10-40 mL Intracatheter Q12H   sodium chloride flush  3 mL Intravenous Q12H   vancomycin variable dose per unstable renal function (pharmacist dosing)   Does not apply See admin instructions    Infusions:  sodium chloride     sodium chloride Stopped (02/01/22 0353)   sodium chloride 10 mL/hr at 01/31/22 2057   sodium chloride Stopped (01/31/22 2057)   ceFEPime (MAXIPIME) IV     epinephrine Stopped (01/30/22 0300)   heparin 1,150 Units/hr (02/01/22 0800)    PRN Medications: sodium chloride, sodium chloride, bisacodyl **OR** bisacodyl, docusate, food thickener, Gerhardt's butt cream, hydrOXYzine, lip balm, ondansetron (ZOFRAN) IV, oxyCODONE, sodium chloride flush, sodium chloride flush, traMADol    Patient Profile   66 y/o female with obesity, T2DM, previous PE, ESRD and mitral regurgitation, 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 Subsequently followed at [facility] for mixed [place] on macitentan and sildenafil. Admitted 2/5 for CP. LHC showed severe 3v CAD with CTO of RCA & LCX and critical ostial LAD lesion. Initial echo, EF 40-45%, no MR, mod MS, RV mildly reduced w/ severe TR. Underwent CABG w/ PFO closure and tricuspid valve repair/valvuloplasty w/ ring 01/12/2022. Complicated post CABG course with RV failure, GN bacteremia and persistent hypotension and inability to wean off pressors. Nephrology following for CVVH.  Assessment/Plan   1. Post-cardiotomy cardiogenic shock - likely due predominantly to end-stage RV failure. (Required ECMO in 2016  post MVR). Echo 01/15/22 with EF 50-55%, D-shaped septum with severe RV dysfunction and enlargement, s/p MV repair, s/p TV repair but still with severe TR.  - Post op platelets down to 30. Switched to [place]. HIT negative. Plts improved  - RHC 3/1 revealed surprisingly well-compensated hemodynamics on 4 vasopressors/inotropes. CO/CI 6.4/3.8, RA 11, PCW 17, PAPi 2.45  - Milrinone stopped 3/1 w/ low SVR (650)  - Now off IV pressor support. SBPs 90-low 100s, on Midodrine 15 q6h - Tolerating iHD this am. -  Will need TDC placed ([facility] seems to be only site open). Pull swan.    2. PAH with cor-pulmonale, end-stage - she clearly has mixed PH (predominantly WHO Groups II-III but perhaps contributions from I & IV) - Hold macitentan. Given her hemodynamics  doubt she will have much to gain from further selective pulmonary artery vasodilators. PVR not elevated.    3. Severe 3vCAD s/p CABG x3 with closure PFO + TV ring on 01/07/2022 - No s/ angina - continue ASA/Statin   4. ESRD - currently tolerating iHD. - WOuld not push volume removal further given RV failure - As above. Will need TDC placed ([facility] seems to be only site open).     5. SVT/possible PAF - maintaining NSR - now off pressors, continue PO amio 200 mg bid  - plts recovered. HIT negative.  - Switched from [place] back to heparin 3/2 -> apixaban one TDC placed   6. H/O PE - 11/2020 VQ : unchanged left lower lobe apical segment PE - VQ 01/02/22 low prob - Continue heparin gtt  for now   7. H/O MV Ring 2016 - stable on echo   8. Anemia  - Hgb down, 8.5>>7.8>>7.7>>8.1 >>8.1>>8.2>>8.0 > 7.9 - transfuse for hgb < 7.0  - iron stores normal   9. ID - morganella morganii bacteremia: completed abx. BCx 2/18 cleared  - abx reinitiated 3/5 for fever, worsening leukocytosis and PCT 1.05  - AF today, WBC down trending  - treating w/ empiric vancomycin/cefepime x 7 days (end-date 3/12)   10. Severe TR despite TV repair  11. Delirium  - frequent  re-orientation  - improved today  CRITICAL CARE Performed by: Glori Bickers  Total critical care time: 35 minutes  Critical care time was exclusive of separately billable procedures and treating other patients.  Critical care was necessary to treat or prevent imminent or life-threatening deterioration.  Critical care was time spent personally by me (independent of midlevel providers or residents) on the following activities: development of treatment plan with patient and/or surrogate as well as nursing, discussions with consultants, evaluation of patient's response to treatment, examination of patient, obtaining history from patient or surrogate, ordering and performing treatments and interventions, ordering and review of laboratory studies, ordering and review of radiographic studies, pulse oximetry and re-evaluation of patient's condition.  Glori Bickers, MD  8:28 AM

## 2022-02-01 NOTE — H&P (Addendum)
Chief Complaint: Patient was seen in consultation today for tunneled catheter placement at the request of Jody Homes, MD.   Referring Physician(s): Jody Homes, MD  Supervising Physician: Jody Nguyen  Patient Status: [name] - In-pt  History of Present Illness: Jody Nguyen is a 66 y.o. female w/ PMH significant for ESRD on HD, CAD, HTN, PE (Eliquis), HLD, DM II, s/p cardiac cath, dyspnea and CVA. Pt was admitted after presenting to ED 01/01/2022 c/o CP, nausea and SOB. Pt had CABG x 3 and PFO closure 2/10. Pt has had continuing issues with access including her AVF that has infiltrated. Pt currently on CRRT. Dr. Ina Nguyen has referred pt to IR to have LIJ tunneled catheter placed d/t not having other viable access. Procedure is tentatively scheduled for 02/02/22.   Past Medical History:  Diagnosis Date   Anemia    Chronic diastolic CHF (congestive heart failure) (HCC)    Chronic diastolic heart failure (HCC)    CKD (chronic kidney disease) stage 4, GFR 15-29 ml/min (HCC)    dialysis M/W/F   Constipation    CVA (cerebral infarction) 1997   no residual deficit   Diverticulitis    DM (diabetes mellitus) (HCC)    type 2   Heart murmur    History of cardiovascular stress test    a. Myoview Oct 2012 showed EF 49%, no ischemia, LVE   HLD (hyperlipidemia)    takes Crestor daily   Hypertension    Pericardial effusion    chronic; felt to be poss related to minoxidil >> DC'd   Pulmonary hypertension ([place]) 06/18/2015   Respiratory failure, acute hypoxic, post-operative 07/08/2015   Requiring ECMO support   Respiratory failure, post-operative (HCC)    S/P cardiac catheterization    a. R/L HC 06/18/15:  mLAD 30%; severe pulmo HTN with PA sat 43%, CI 1.86, prominent V waves indicative of MR; resting hypoxemia O2 sat 86% on RA   S/P minimally invasive mitral valve repair 07/08/2015   Complex valvuloplasty including artificial Gore-tex neochord placement x10 and 26 mm Sorin Memo 3D  Rechord ring annuloplasty via right mini thoracotomy approach   S/P minimally invasive mitral valve repair 07/08/2015   Complex valvuloplasty including artificial Gore-tex neochord placement x10 and 26 mm Sorin Memo 3D Rechord ring annuloplasty via right mini thoracotomy approach   Severe mitral regurgitation    Shortness of breath dyspnea    Stroke ([place]) 1997   no residual effect   Vitamin D deficiency    Wears glasses     Past Surgical History:  Procedure Laterality Date   A/V FISTULAGRAM Right 08/16/2017   Procedure: A/V Fistulagram;  Surgeon: Jody Sorrento, MD;  Location: [place] [facility];  Service: Cardiovascular;  Laterality: Right;   AV FISTULA PLACEMENT Left 10/22/2015   Procedure: RADIOCEPHALIC ARTERIOVENOUS (AV) FISTULA CREATION;  Surgeon: Jody Mitchell, MD;  Location: [place] [facility];  Service: Vascular;  Laterality: Left;   AV FISTULA PLACEMENT Right 05/22/2017   Procedure: ARTERIOVENOUS (AV) FISTULA CREATION-RIGHT ARM;  Surgeon: Jody Sandy, MD;  Location: [place];  Service: Vascular;  Laterality: Right;   CANNULATION FOR CARDIOPULMONARY BYPASS N/A 07/08/2015   Procedure: CANNULATION FOR ECMO;  Surgeon: Jody Alberts, MD;  Location: [place];  Service: Open Heart Surgery;  Laterality: N/A;   CARDIAC CATHETERIZATION     CARDIAC CATHETERIZATION N/A 06/18/2015   Procedure: Right/Left Heart Cath and Coronary Angiography;  Surgeon: Jody Booze, MD;  Location: [place] [facility];  Service: Cardiovascular;  Laterality: N/A;   CESAREAN SECTION     x 2   COLONOSCOPY     CORONARY ARTERY BYPASS GRAFT N/A 12/29/2021   Procedure: CORONARY ARTERY BYPASS GRAFTING (CABG) TIMES THREE ON CARDIOPULMONARY BYPASS. LIMA TO LAD, SVG TO PD, SVG TO DIAG;  Surgeon: Jody Nakayama, MD;  Location: [place];  Service: Open Heart Surgery;  Laterality: N/A;   ENDOVEIN HARVEST OF GREATER SAPHENOUS VEIN Right 01/18/2022   Procedure: ENDOVEIN HARVEST OF GREATER SAPHENOUS VEIN;  Surgeon:  Jody Nakayama, MD;  Location: [place];  Service: Open Heart Surgery;  Laterality: Right;  RIGHT UPPER AND LOWER LEG   EXCHANGE OF A DIALYSIS CATHETER N/A 06/28/2017   Procedure: EXCHANGE OF A DIALYSIS CATHETER;  Surgeon: Jody Sandy, MD;  Location: [place];  Service: Vascular;  Laterality: N/A;   FISTULA SUPERFICIALIZATION Left 12/28/2015   Procedure: SUPERFICIALIZATION LEFT RADIOCEPHALIC FISTULA;  Surgeon: Jody Mitchell, MD;  Location: [place];  Service: Vascular;  Laterality: Left;   FISTULA SUPERFICIALIZATION Right 08/21/2017   Procedure: FISTULA SUPERFICIALIZATION RIGHT ARM;  Surgeon: Jody Sandy, MD;  Location: [place];  Service: Vascular;  Laterality: Right;   INSERTION OF DIALYSIS CATHETER Right 04/28/2017   Procedure: INSERTION OF DIALYSIS CATHETER-RIGHT INTERNAL JUGULAR PLACEMENT;  Surgeon: Jody Dutch, MD;  Location: [place];  Service: Vascular;  Laterality: Right;   LEFT HEART CATH AND CORONARY ANGIOGRAPHY N/A 01/20/2022   Procedure: LEFT HEART CATH AND CORONARY ANGIOGRAPHY;  Surgeon: Jody Sine, MD;  Location: [place] [facility];  Service: Cardiovascular;  Laterality: N/A;   LIGATION OF ARTERIOVENOUS  FISTULA Left 04/28/2017   Procedure: LIGATION OF ARTERIOVENOUS  FISTULA;  Surgeon: Jody Dutch, MD;  Location: [place];  Service: Vascular;  Laterality: Left;   MITRAL VALVE REPAIR Right 07/08/2015   Procedure: MINIMALLY INVASIVE MITRAL VALVE REPAIR with a 26 Sorin Memo 3D Rechord;  Surgeon: Jody Alberts, MD;  Location: [place];  Service: Open Heart Surgery;  Laterality: Right;   MULTIPLE EXTRACTIONS WITH ALVEOLOPLASTY N/A 06/30/2015   Procedure: MULTIPLE EXTRACTIONS OF TOOTH #'S 4  AND 30 WITH ALVEOLOPLASTY AND GROSS DEBRIDEMENT  OF REMAINING TEETH;  Surgeon: Jody Nguyen, DDS;  Location: [place];  Service: Oral Surgery;  Laterality: N/A;   PERIPHERAL VASCULAR CATHETERIZATION Left 03/28/2016   Procedure: A/V Fistulagram;  Surgeon: Jody Mitchell, MD;   Location: [place] [facility];  Service: Cardiovascular;  Laterality: Left;  lower arm   REPAIR OF PATENT FORAMEN OVALE N/A 01/13/2022   Procedure: REPAIR OF PATENT FORAMEN OVALE;  Surgeon: Jody Nakayama, MD;  Location: [place];  Service: Open Heart Surgery;  Laterality: N/A;   RIGHT HEART CATH N/A 02/09/2022   Procedure: RIGHT HEART CATH;  Surgeon: Jolaine Artist, MD;  Location: [place] [facility];  Service: Cardiovascular;  Laterality: N/A;   TEE WITHOUT CARDIOVERSION N/A 06/08/2015   Procedure: TRANSESOPHAGEAL ECHOCARDIOGRAM (TEE);  Surgeon: Lelon Perla, MD;  Location: [facility];  Service: Cardiovascular;  Laterality: N/A;   TEE WITHOUT CARDIOVERSION N/A 07/08/2015   Procedure: TRANSESOPHAGEAL ECHOCARDIOGRAM (TEE);  Surgeon: Jody Alberts, MD;  Location: [place];  Service: Open Heart Surgery;  Laterality: N/A;   TEE WITHOUT CARDIOVERSION N/A 01/23/2022   Procedure: TRANSESOPHAGEAL ECHOCARDIOGRAM (TEE);  Surgeon: Jody Nakayama, MD;  Location: [place];  Service: Open Heart Surgery;  Laterality: N/A;   TRICUSPID VALVE REPLACEMENT N/A 01/19/2022   Procedure: TRICUSPID VALVE REPAIR USING AN 36MM  EDWARDS MC3 TRICUSPID ANNULOPLASTY RING;  Surgeon: Jody Nakayama, MD;  Location: [place];  Service: Open Heart Surgery;  Laterality: N/A;    Allergies: Minoxidil, Lipitor [atorvastatin], Morphine and related, and Ace inhibitors  Medications: Prior to Admission medications   Medication Sig Start Date End Date Taking? Authorizing Provider  acetaminophen (TYLENOL) 500 MG tablet Take 500 mg by mouth as needed (for pain.).   Yes [provider]  apixaban (ELIQUIS) 5 MG TABS tablet Take 5 mg by mouth 2 (two) times daily.   Yes [provider]  aspirin EC 81 MG tablet Take 81 mg by mouth daily.   Yes [provider]  carvedilol (COREG) 25 MG tablet Take 12.5 mg by mouth 2 (two) times daily.   Yes [provider]  cinacalcet (SENSIPAR) 60 MG tablet  Take 1 tablet (60 mg total) by mouth daily. Patient taking differently: Take 60 mg by mouth See admin instructions. Takes at dialysis M/W/F 09/11/17  Yes Unk Pinto, MD  hydrALAZINE (APRESOLINE) 50 MG tablet Take 1 tablet (50 mg total) by mouth 3 (three) times daily. FOR BLOOD  PRESSURE AND HEART 11/09/20  Yes Lelon Perla, MD  latanoprost (XALATAN) 0.005 % ophthalmic solution Place 1 drop into both eyes at bedtime. 09/16/18  Yes [provider]  losartan (COZAAR) 50 MG tablet Take 1 tablet Daily for BP Patient taking differently: Take 50 mg by mouth daily. 03/19/20  Yes Unk Pinto, MD  macitentan (OPSUMIT) 10 MG tablet Take 10 mg by mouth daily.   Yes [provider]  nitroGLYCERIN (NITROSTAT) 0.4 MG SL tablet Place 0.4 mg under the tongue every 5 (five) minutes x 3 doses as needed for chest pain. 08/24/21  Yes [provider]  ondansetron (ZOFRAN ODT) 4 MG disintegrating tablet Take 1 tablet (4 mg total) by mouth every 8 (eight) hours as needed for nausea or vomiting. 02/23/19  Yes Fawze, Mina A, PA-C  pantoprazole (PROTONIX) 40 MG tablet Take 1 tablet (40 mg total) by mouth daily. Patient taking differently: Take 40 mg by mouth daily as needed (GERD). 08/29/21 01/01/23 Yes Barb Merino, MD  RENVELA 800 MG tablet Take 1,600-2,400 mg by mouth 3 (three) times daily with meals. 12/21/15  Yes [provider]  rosuvastatin (CRESTOR) 20 MG tablet Take 1 tablet Daily for Cholesterol Patient taking differently: Take 20 mg by mouth at bedtime. 03/19/20  Yes Unk Pinto, MD  sertraline (ZOLOFT) 25 MG tablet Take 1 tablet Daily for Mood Patient taking differently: Take 25 mg by mouth daily. 03/19/20  Yes Unk Pinto, MD  sevelamer carbonate (RENVELA) 2.4 g PACK Take 2.4 g by mouth 3 (three) times daily with meals.   Yes [provider]  sildenafil (REVATIO) 20 MG tablet Take 2 tablets (40 mg) 3 x  /day fr Pulmonary Hypertension Patient taking  differently: Take 40 mg by mouth 3 (three) times daily. 03/26/20  Yes Unk Pinto, MD  azelastine (ASTELIN) 0.1 % nasal spray Use 1 to 2 sprays each nostril 2 to 3 x / day Patient not taking: Reported on 01/01/2022 03/26/18   Unk Pinto, MD     Family History  Problem Relation Age of Onset   Stroke Mother    Breast cancer Sister    Cancer Sister        breast cancer   Heart attack Brother        MI in his 72s   Heart disease Brother  before age 61   Cancer Brother     Social History   Socioeconomic History   Marital status: Married    Spouse name: Not on file   Number of children: 3   Years of education: Not on file   Highest education level: Not on file  Occupational History    Comment: Unemployed; used to work as a Radio broadcast assistant  Tobacco Use   Smoking status: Never   Smokeless tobacco: Never  Vaping Use   Vaping Use: Never used  Substance and Sexual Activity   Alcohol use: No    Alcohol/week: 0.0 standard drinks   Drug use: No   Sexual activity: Not on file  Other Topics Concern   Not on file  Social History Narrative   Not on file   Social Determinants of Health   Financial Resource Strain: Not on file  Food Insecurity: No Food Insecurity   Worried About Charity fundraiser in the Last Year: Never true   [place] in the Last Year: Never true  Transportation Needs: No Transportation Needs   Lack of Transportation (Medical): No   Lack of Transportation (Non-Medical): No  Physical Activity: Not on file  Stress: Not on file  Social Connections: Not on file    Review of Systems: A 12 point ROS discussed and pertinent positives are indicated in the HPI above.  All other systems are negative.  Review of Systems  Reason unable to perform ROS: AMS.   Vital Signs: BP (!) 75/53 Comment: following aline   Pulse 82    Temp 97.7 F (36.5 C)    Resp 15    Ht 4' 11.5" (1.511 m)    Wt 155 lb 6.8 oz (70.5 kg)    SpO2 (!) 88%    BMI  30.87 kg/m   Physical Exam Constitutional:      Appearance: She is ill-appearing.  HENT:     Head: Normocephalic and atraumatic.     Mouth/Throat:     Mouth: Mucous membranes are dry.     Pharynx: Oropharynx is clear.  Eyes:     Extraocular Movements: Extraocular movements intact.     Pupils: Pupils are equal, round, and reactive to light.  Cardiovascular:     Rate and Rhythm: Normal rate and regular rhythm.     Pulses: Normal pulses.     Heart sounds: Normal heart sounds.  Pulmonary:     Effort: Pulmonary effort is normal. No respiratory distress.     Breath sounds: Normal breath sounds. No stridor. No wheezing, rhonchi or rales.  Abdominal:     General: Bowel sounds are normal. There is distension.     Palpations: Abdomen is soft.     Tenderness: There is no abdominal tenderness. There is no guarding.  Musculoskeletal:     Right lower leg: No edema.     Left lower leg: No edema.  Skin:    General: Skin is warm and dry.  Neurological:     Mental Status: She is alert. She is disoriented.  Psychiatric:        Mood and Affect: Mood normal.        Behavior: Behavior normal.    Imaging: DG Abd 1 View  Result Date: 01/24/2022 CLINICAL DATA:  66 year old female feeding tube placement. EXAM: ABDOMEN - 1 VIEW COMPARISON:  01/11/2022 and earlier. FINDINGS: Portable AP view at 0627 hours. Enteric feeding tube courses through the stomach and terminates in the midline at  the level of the gastric antrum similar to earlier this month. Retained barium type contrast in the stomach, nondilated small bowel loops, and some colon. Improved left lung base ventilation with continued tenting of the left hemidiaphragm. Bilateral femoral approach vascular catheters. No acute osseous abnormality identified. IMPRESSION: 1. Enteric feeding tube tip at the level of the gastric antrum similar to earlier this month. 2. Nonobstructed bowel pattern with retained barium type contrast in the stomach and  multiple loops. Electronically Signed   By: Genevie Ann M.D.   On: 01/24/2022 08:45   CT SOFT TISSUE NECK WO CONTRAST  Result Date: 01/23/2022 CLINICAL DATA:  Soft tissue swelling EXAM: CT NECK WITHOUT CONTRAST TECHNIQUE: Multidetector CT imaging of the neck was performed following the standard protocol without intravenous contrast. RADIATION DOSE REDUCTION: This exam was performed according to the departmental dose-optimization program which includes automated exposure control, adjustment of the mA and/or kV according to patient size and/or use of iterative reconstruction technique. COMPARISON:  None. FINDINGS: PHARYNX AND LARYNX: The nasopharynx, oropharynx and larynx are normal. Visible portions of the oral cavity, tongue base and floor of mouth are normal. Normal epiglottis, vallecula and pyriform sinuses. The larynx is normal. No retropharyngeal abscess, effusion or lymphadenopathy. SALIVARY GLANDS: Normal parotid, submandibular and sublingual glands. THYROID: Normal. LYMPH NODES: No enlarged or abnormal density lymph nodes. VASCULAR: Major cervical vessels are patent. LIMITED INTRACRANIAL: Normal. VISUALIZED ORBITS: Normal. MASTOIDS AND VISUALIZED PARANASAL SINUSES: Bilateral mastoid fluid. Visualized paranasal sinuses are unremarkable. SKELETON: No bony spinal canal stenosis. No lytic or blastic lesions. UPPER CHEST: Small left pleural effusion OTHER: Nasogastric tube courses below the field of view IMPRESSION: 1. No acute abnormality of the neck. 2. Small left pleural effusion. Electronically Signed   By: Ulyses Jarred M.D.   On: 01/23/2022 21:00   CARDIAC CATHETERIZATION  Result Date: 02/23/2022 Findings: On NE 2 EPi 3 Milrinone 0.25 VP 0.02 Ao = 104/48 (63) RA = 11 RV = 35/10 PA = 53/26 (34) PCW = 17 Fick cardiac output/index = 6.4/3.8 Thermal CO/CI = 5.2/3.1 PVR (Fick) = 2.7 PVR (Therm) = 3.3 SVR (Fick) = 650 SVR (Therm) = 807 Ao sat = 92% PA sat = 58%, 57% Assessment: Surprisingly well-compensated  hemodynamics on 4 vasopressors/inotropes. Volume status much improved Plan/Discussion: Will wean pressors using swan guidance. Cut milrinone to 0.125. Wean NE next.  Keep VP for now with low SVR. Glori Bickers, MD 8:45 AM  CARDIAC CATHETERIZATION  Result Date: 01/20/2022   Prox LAD lesion is 99% stenosed.   1st Diag lesion is 90% stenosed.   Ost Cx to Prox Cx lesion is 100% stenosed.   Prox RCA lesion is 100% stenosed. Severe coronary calcification and multivessel CAD. The LAD is 99% noticed after giving rise to a optional diagonal like vessel that has 90% ostial stenosis.  The mid LAD has 30 and 70% mid distal stenoses. The left circumflex vessel was totally occluded at the ostium.  There is septal to the distal circumflex collateralization with filling of the vessel up to the proximal AV groove. The right coronary artery is totally occluded proximally.  There is bridging collaterals filling the RCA from the RV marginal branch downward. LVEDP 23 mm. RECOMMENDATION: We will obtain surgical consultation for consideration of CABG revascularization.  The patient has good targets.  Reportedly, the patient had no ischemia on a prior nuclear study and a lot of anatomical findings, question if there was balanced ischemia at the time of her prior study.  Will  resume heparin 8 hours post procedure.  NM Pulmonary Perf and Vent  Result Date: 01/02/2022 CLINICAL DATA:  Pulmonary embolism (PE) suspected, high prob EXAM: NUCLEAR MEDICINE PERFUSION LUNG SCAN TECHNIQUE: Perfusion images were obtained in multiple projections after intravenous injection of radiopharmaceutical. Ventilation scans intentionally deferred if perfusion scan and chest x-ray adequate for interpretation during COVID 19 epidemic. RADIOPHARMACEUTICALS:  4.1 mCi Tc-68m MAA IV COMPARISON:  Chest x-ray 01/02/2022 FINDINGS: Distribution of radiopharmaceutical within both lung fields. Large area of central photopenia related to cardiomegaly. No segmental  perfusion defect. IMPRESSION: Low probability for pulmonary embolism. Electronically Signed   By: Davina Poke D.O.   On: 01/02/2022 16:25   DG CHEST PORT 1 VIEW  Result Date: 01/28/2022 CLINICAL DATA:  Status post CABG. Evaluate airspace disease, atelectasis, pleural effusion or pneumothorax. EXAM: PORTABLE CHEST 1 VIEW COMPARISON:  02/11/2022 FINDINGS: Feeding tube tip is below the level of the hemidiaphragms. There is a pulmonary arterial catheter from femoral insertion with tip projecting over the left main pulmonary artery. Opacification within the left lower lung with multiple air bronchograms appears unchanged from previous study. The remaining portions of the lungs are clear. No signs of a pleural effusion or pneumothorax. IMPRESSION: No change in aeration to the left lower lung. Electronically Signed   By: Kerby Moors M.D.   On: 01/28/2022 10:22   Port CXR  Result Date: 01/31/2022 CLINICAL DATA:  Central line placement EXAM: PORTABLE CHEST 1 VIEW COMPARISON:  01/21/2022 FINDINGS: Nasogastric tube projecting over the stomach. Femoral insertion of a Swan-Ganz catheter with the tip projecting over the left main pulmonary artery. Bilateral mild interstitial thickening. Left basilar airspace disease likely reflecting atelectasis. No pleural effusion or pneumothorax. Stable cardiomegaly. Prior CABG. Prior tricuspid and mitral valve replacement. No acute osseous abnormality. IMPRESSION: 1. Femoral insertion of a Swan-Ganz catheter with the tip projecting over the left main pulmonary artery. 2. Nasogastric tube projecting over the stomach. 3. Cardiomegaly with mild pulmonary vascular congestion. Electronically Signed   By: Kathreen Devoid M.D.   On: 01/31/2022 10:04   DG CHEST PORT 1 VIEW  Result Date: 01/21/2022 CLINICAL DATA:  Hypoxia EXAM: PORTABLE CHEST 1 VIEW COMPARISON:  01/18/2022 FINDINGS: Transverse diameter of heart is increased. There is prosthetic valve placement in the heart. There is  also suggestion of coronary bypass surgery. Enteric tube is noted traversing the esophagus. Increased density in the left lower lung field obscuring the left hemidiaphragm and blunting of left lateral CP angle have not changed. IMPRESSION: Cardiomegaly. Increased density in the left lower lung fields may suggest pleural effusion and possibly atelectasis/pneumonia. No significant interval changes are noted. Electronically Signed   By: Elmer Picker M.D.   On: 01/21/2022 11:53   DG CHEST PORT 1 VIEW  Result Date: 01/18/2022 CLINICAL DATA:  66 year old female with shortness of breath, respiratory distress. EXAM: PORTABLE CHEST - 1 VIEW COMPARISON:  01/17/2022, 01/16/2022, 01/14/2022, 01/13/2022 FINDINGS: The mediastinal contours are unchanged. Unchanged cardiomegaly. Postsurgical changes after coronary artery bypass graft, mitral and tricuspid valve repair. Enteric tube terminates off the inferior aspect of this image, unchanged. Unchanged retrocardiac opacities and mild blunting of left costophrenic angle. No pneumothorax. No acute osseous abnormality. Median sternotomy wires in place. IMPRESSION: 1. Similar appearing left basilar opacity and possible trace left pleural effusion. 2. Unchanged cardiomegaly. Electronically Signed   By: Ruthann Cancer M.D.   On: 01/18/2022 08:31   DG Chest Port 1 View  Result Date: 01/17/2022 CLINICAL DATA:  S/P CABG (coronary artery bypass  graft) Z95.1 (ICD-10-CM) EXAM: PORTABLE CHEST 1 VIEW COMPARISON:  January 16, 2022. FINDINGS: Similar enlarged cardiac silhouette. Median sternotomy, tricuspid repair, and CABG. Probable small left pleural effusion. Similar left basilar opacities. Enteric tube courses below the diaphragm with the tip outside the field of view. IMPRESSION: 1. Similar probable small left pleural effusion and overlying left basilar opacities, which could represent atelectasis and/or pneumonia. 2. Similar postoperative change and cardiomegaly. Electronically  Signed   By: Margaretha Sheffield M.D.   On: 01/17/2022 08:20   DG Chest Port 1 View  Result Date: 01/16/2022 CLINICAL DATA:  Shortness of breath and chest pain EXAM: PORTABLE CHEST 1 VIEW COMPARISON:  01/14/2022 FINDINGS: Feeding tube extends beyond the inferior aspect of the film. Median sternotomy for CABG and tricuspid valve repair. Cardiomegaly accentuated by AP portable technique. Atherosclerosis in the transverse aorta. Probable small left pleural effusion. No pneumothorax. Low lung volumes with resultant pulmonary interstitial prominence. Persistent left mid and lower lung airspace disease. IMPRESSION: No significant change since the prior exam. Cardiomegaly and low lung volumes with probable small left pleural effusion and left base airspace disease. Most likely atelectasis. Aortic Atherosclerosis (ICD10-I70.0). Electronically Signed   By: Abigail Miyamoto M.D.   On: 01/16/2022 10:48   DG Chest Port 1 View  Result Date: 01/14/2022 CLINICAL DATA:  Sore chest EXAM: PORTABLE CHEST 1 VIEW COMPARISON:  Radiograph 01/13/2022 FINDINGS: Enteric tube passes below the diaphragm, tip excluded by collimation. Intact median sternotomy. Prior CABG, aortic valve replacement, and mitral annuloplasty. Unchanged, enlarged cardiac silhouette. Persistent perihilar and bibasilar airspace disease. Minimal central pulmonary vascular congestion. Small left effusion. No visible pneumothorax. No acute osseous abnormality. IMPRESSION: Postoperative chest with persistent perihilar and bibasilar airspace opacities, favored to be atelectasis, developing infection possible. Continued radiographic follow-up recommended. Electronically Signed   By: Maurine Simmering M.D.   On: 01/14/2022 08:20   DG CHEST PORT 1 VIEW  Addendum Date: 01/13/2022   ADDENDUM REPORT: 01/13/2022 12:42 ADDENDUM: The LEFT-sided catheter could potentially be directed into the IJ based on position, by report intended placement was subclavian. Position is uncertain.  This could also potentially be within the soft tissues. Critical Value/emergent results were called by telephone at the time of interpretation on 01/13/2022 at 12:42 pm to provider [facility] , who verbally acknowledged these results. Electronically Signed   By: Zetta Bills M.D.   On: 01/13/2022 12:42   Result Date: 01/13/2022 CLINICAL DATA:  Central line placement. EXAM: PORTABLE CHEST 1 VIEW COMPARISON:  Chest x-ray from January 12, 2022. FINDINGS: A catheter projects over the LEFT chest. Tip is outside of the thoracic cavity. Catheter extends to the LEFT axilla projecting over the soft tissues. Gastric tube, feeding type tube courses through in off the field of the radiograph. EKG leads project over the chest. Post median sternotomy for valve replacement of mitral and tricuspid valves. Postoperative changes also of CABG. Cardiomediastinal contours remain enlarged. Increasing opacity at the LEFT lung base since prior imaging. No signs of pneumothorax. On limited assessment there is no acute skeletal process. IMPRESSION: 1. A catheter projects over the LEFT chest. Tip is outside of the thoracic cavity. This may be within a peripheral venous structure based on position but is of uncertain significance. Correlate with injury site into the venous system and intended positioning. This is likely not within a central vein. 2. Catheter extends to the LEFT axilla projecting over the soft tissues. 3. Increasing opacity at the LEFT lung base since prior imaging, correlate with any  signs of infection. 4. A call is out to the referring provider to further discuss findings in the above case. Electronically Signed: By: Zetta Bills M.D. On: 01/13/2022 12:18   DG Chest Port 1 View  Result Date: 01/12/2022 CLINICAL DATA:  Endotracheal tube present, recent CABG. EXAM: PORTABLE CHEST 1 VIEW COMPARISON:  Chest radiograph dated January 11, 2022 FINDINGS: The heart is enlarged. Mitral and tricuspid annuloplasty.  Atherosclerotic calcification of aortic arch. Lungs are clear without evidence of focal consolidation or large pleural effusion. Minimal left basilar atelectasis, likely postsurgical. Endotracheal tube with distal tip approximately 4.6 cm above the carina, unchanged. Left IJ access central line with distal tip near the brachiocephalic confluence, unchanged. Feeding tube with distal tip coursing below the diaphragm, distal tip not included. IMPRESSION: 1.  No acute cardiopulmonary process. 2.  Support lines are unchanged. 3. Stable cardiomegaly, mitral and tricuspid annuloplasty and postsurgical changes. Electronically Signed   By: Keane Police D.O.   On: 01/12/2022 08:30   DG Chest Port 1 View  Result Date: 01/11/2022 CLINICAL DATA:  Status post CABG.  On dialysis. EXAM: PORTABLE CHEST 1 VIEW COMPARISON:  Multiple priors including most recent January 10, 2022 FINDINGS: Left IJ vascular sheath with tip near the brachiocephalic confluence, unchanged. Endotracheal tube with tip projecting over the midthoracic trachea. Nasogastric tube coursing below the diaphragm with tip obscured by collimation. Surgical changes of median sternotomy and CABG are present and stable. Prior tricuspid and mitral valve annuloplasty. The cardiac enlargement similar prior. No focal airspace consolidation. No significant pleural effusion or visible pneumothorax. IMPRESSION: 1. Stable support devices as above. 2. No acute cardiopulmonary process. 3. Stable cardiomegaly and postsurgical changes of median sternotomy, CABG, and tricuspid and mitral valve annuloplasty. Electronically Signed   By: Dahlia Bailiff M.D.   On: 01/11/2022 08:14   DG Chest Port 1 View  Result Date: 01/10/2022 CLINICAL DATA:  Endotracheal tube, status post coronary artery bypass grafting. EXAM: PORTABLE CHEST 1 VIEW COMPARISON:  None. FINDINGS: The heart is enlarged. Evidence of prior CABG. Tricuspid and mitral valve annuloplasty. Lungs are clear without evidence  of focal consolidation or pleural effusion. Elevation of the right hemidiaphragm. Interval removal of the left-sided chest tube. No appreciable pneumothorax. Endotracheal tube with distal tip approximately 5 cm above the carina. Left IJ access central venous catheter is unchanged. NG tube with distal tip projecting over the stomach, however side port at the level of GE junction. IMPRESSION: 1.  Interval removal of the left-sided chest tube. 2. Feeding tube with side port at the level of the GE junction, it could be advanced 2-3 cm. 3.  No significant pleural effusion or pneumothorax. Electronically Signed   By: Keane Police D.O.   On: 01/10/2022 08:32   DG CHEST PORT 1 VIEW  Result Date: 01/09/2022 CLINICAL DATA:  CABG. EXAM: PORTABLE CHEST 1 VIEW COMPARISON:  Chest x-ray from yesterday. FINDINGS: Unchanged endotracheal and enteric tubes. Unchanged left internal jugular central venous catheter, mediastinal drain, and left chest tube. Stable cardiomegaly status post CABG, mitral valve annuloplasty, and tricuspid valve annuloplasty. Normal pulmonary vascularity. Unchanged mild bibasilar atelectasis. No focal consolidation, pleural effusion, or pneumothorax. No acute osseous abnormality. IMPRESSION: 1. Stable lines and tubes. No active disease. Electronically Signed   By: Titus Dubin M.D.   On: 01/09/2022 08:01   DG CHEST PORT 1 VIEW  Result Date: 01/08/2022 CLINICAL DATA:  66 year old female status post CABG. EXAM: PORTABLE CHEST 1 VIEW COMPARISON:  Chest x-ray 01/07/2022. FINDINGS: An  endotracheal tube is in place with tip 3.5 cm above the carina. Midline surgical drain may represent a mediastinal or pericardial drainage catheter. Left-sided chest tube in position with tip in the lateral aspect of the lower left hemithorax. A nasogastric tube is seen extending into the stomach, however, the tip of the nasogastric tube extends below the lower margin of the image. Status post median sternotomy for CABG,  tricuspid annuloplasty and mitral annuloplasty. Epicardial pacing wires are also noted. Lung volumes are slightly low. Bibasilar opacities are favored to reflect areas of postoperative subsegmental atelectasis. No definite acute consolidative airspace disease. No appreciable pneumothorax. No evidence of pulmonary edema. Mild enlargement of the cardiopericardial silhouette, stable. Upper mediastinal contours are within normal limits. Atherosclerotic calcifications in the thoracic aorta. IMPRESSION: 1. Postoperative changes and support apparatus, as above. 2. Low lung volumes with residual postoperative subsegmental atelectasis. 3. Aortic atherosclerosis. Electronically Signed   By: Vinnie Langton M.D.   On: 01/08/2022 08:23   DG Chest Port 1 View  Result Date: 01/07/2022 CLINICAL DATA:  Status post CABG surgery.  Follow-up exam. EXAM: PORTABLE CHEST 1 VIEW COMPARISON:  12/28/2021 and older exams. FINDINGS: Cardiac silhouette is enlarged, but stable. No mediastinal widening. Bilateral lung base opacities, partly obscuring the right and obscuring the left hemidiaphragms, consistent with a combination of atelectasis and pleural fluid. There is central vascular congestion with additional perihilar opacities that are likely atelectasis. Mild interstitial prominence bilaterally without overt edema. No pneumothorax. Left internal jugular small Ganz catheter has been removed since the previous exam. The introducer sheath remains in place. Endotracheal tube, nasal/orogastric tube, mediastinal tube and left inferior me thorax chest tube are stable in well positioned. IMPRESSION: 1. No acute findings or evidence of an operative complication. 2. Swan-Ganz catheter has been removed. Remaining support apparatus is stable and well positioned. 3. Persistent lung base opacities consistent with combination of atelectasis and small effusions. Perihilar opacities consistent with atelectasis. Central vascular congestion without  evidence of pulmonary edema. 4. No pneumothorax. Electronically Signed   By: Lajean Manes M.D.   On: 01/07/2022 16:44   DG Chest Port 1 View  Result Date: 01/05/2022 CLINICAL DATA:  Postoperative CABG EXAM: PORTABLE CHEST 1 VIEW COMPARISON:  01/02/2022 FINDINGS: Interval postoperative changes from CABG. Endotracheal tube terminates approximately 2.1 cm above the carina. Enteric tube courses below the diaphragm with distal tip beyond the inferior margin of the film. Left IJ approach Swan-Ganz catheter terminates within the right lower pulmonary outflow. Left basilar chest tube and mediastinal drain. Stable cardiomegaly. Ill-defined mediastinal structures is favored to be postoperative. Probable small right pleural effusion. Bibasilar atelectasis. No pneumothorax. IMPRESSION: 1. Interval postoperative changes from CABG. No pneumothorax. 2. Support lines and tubes as above. 3. Bibasilar atelectasis and probable small right pleural effusion. Electronically Signed   By: Davina Poke D.O.   On: 01/08/2022 17:48   DG Abd Portable 1V  Result Date: 01/11/2022 CLINICAL DATA:  Feeding tube placement EXAM: PORTABLE ABDOMEN - 1 VIEW COMPARISON:  None. FINDINGS: Feeding tube enters the stomach with the tip near the gastric antrum. Normal bowel gas pattern. Right femoral central venous catheter tip in the proximal IVC. Left femoral venous catheter in the left iliac vein. IMPRESSION: Feeding tube in the gastric antrum. Electronically Signed   By: Franchot Gallo M.D.   On: 01/11/2022 15:41   DG Swallowing Func-Speech Pathology  Result Date: 01/23/2022 Table formatting from the original result was not included. Objective Swallowing Evaluation: Type of Study: MBS-Modified Barium Swallow  Study  Patient Details Name: Jody Nguyen MRN: [id number] Date of Birth: 21-Jun-1956 Today's Date: 01/23/2022 Time: SLP Start Time (ACUTE ONLY): 1609 -SLP Stop Time (ACUTE ONLY): 1628 SLP Time Calculation (min) (ACUTE ONLY): 19 min Past  Medical History: Past Medical History: Diagnosis Date  Anemia   Chronic diastolic CHF (congestive heart failure) (HCC)   Chronic diastolic heart failure (HCC)   CKD (chronic kidney disease) stage 4, GFR 15-29 ml/min (HCC)   dialysis M/W/F  Constipation   CVA (cerebral infarction) 1997  no residual deficit  Diverticulitis   DM (diabetes mellitus) (HCC)   type 2  Heart murmur   History of cardiovascular stress test   a. Myoview Oct 2012 showed EF 49%, no ischemia, LVE  HLD (hyperlipidemia)   takes Crestor daily  Hypertension   Pericardial effusion   chronic; felt to be poss related to minoxidil >> DC'd  Pulmonary hypertension ([place]) 06/18/2015  Respiratory failure, acute hypoxic, post-operative 07/08/2015  Requiring ECMO support  Respiratory failure, post-operative (HCC)   S/P cardiac catheterization   a. R/L HC 06/18/15:  mLAD 30%; severe pulmo HTN with PA sat 43%, CI 1.86, prominent V waves indicative of MR; resting hypoxemia O2 sat 86% on RA  S/P minimally invasive mitral valve repair 07/08/2015  Complex valvuloplasty including artificial Gore-tex neochord placement x10 and 26 mm Sorin Memo 3D Rechord ring annuloplasty via right mini thoracotomy approach  S/P minimally invasive mitral valve repair 07/08/2015  Complex valvuloplasty including artificial Gore-tex neochord placement x10 and 26 mm Sorin Memo 3D Rechord ring annuloplasty via right mini thoracotomy approach  Severe mitral regurgitation   Shortness of breath dyspnea   Stroke ([place]) 1997  no residual effect  Vitamin D deficiency   Wears glasses  Past Surgical History: Past Surgical History: Procedure Laterality Date  A/V FISTULAGRAM Right 08/16/2017  Procedure: A/V Fistulagram;  Surgeon: Jody Blue Jay, MD;  Location: [place] [facility];  Service: Cardiovascular;  Laterality: Right;  AV FISTULA PLACEMENT Left 10/22/2015  Procedure: RADIOCEPHALIC ARTERIOVENOUS (AV) FISTULA CREATION;  Surgeon: Jody Mitchell, MD;  Location: [place] [facility];  Service: Vascular;  Laterality:  Left;  AV FISTULA PLACEMENT Right 05/22/2017  Procedure: ARTERIOVENOUS (AV) FISTULA CREATION-RIGHT ARM;  Surgeon: Jody Sandy, MD;  Location: [place];  Service: Vascular;  Laterality: Right;  CANNULATION FOR CARDIOPULMONARY BYPASS N/A 07/08/2015  Procedure: CANNULATION FOR ECMO;  Surgeon: Jody Alberts, MD;  Location: [place];  Service: Open Heart Surgery;  Laterality: N/A;  CARDIAC CATHETERIZATION    CARDIAC CATHETERIZATION N/A 06/18/2015  Procedure: Right/Left Heart Cath and Coronary Angiography;  Surgeon: Jody Booze, MD;  Location: [place] [facility];  Service: Cardiovascular;  Laterality: N/A;  CESAREAN SECTION    x 2  COLONOSCOPY    CORONARY ARTERY BYPASS GRAFT N/A 01/17/2022  Procedure: CORONARY ARTERY BYPASS GRAFTING (CABG) TIMES THREE ON CARDIOPULMONARY BYPASS. LIMA TO LAD, SVG TO PD, SVG TO DIAG;  Surgeon: Jody Nakayama, MD;  Location: [place];  Service: Open Heart Surgery;  Laterality: N/A;  ENDOVEIN HARVEST OF GREATER SAPHENOUS VEIN Right 01/20/2022  Procedure: ENDOVEIN HARVEST OF GREATER SAPHENOUS VEIN;  Surgeon: Jody Nakayama, MD;  Location: [place];  Service: Open Heart Surgery;  Laterality: Right;  RIGHT UPPER AND LOWER LEG  EXCHANGE OF A DIALYSIS CATHETER N/A 06/28/2017  Procedure: EXCHANGE OF A DIALYSIS CATHETER;  Surgeon: Jody Sandy, MD;  Location: [place];  Service: Vascular;  Laterality: N/A;  FISTULA SUPERFICIALIZATION Left 12/28/2015  Procedure: SUPERFICIALIZATION LEFT  RADIOCEPHALIC FISTULA;  Surgeon: Jody Mitchell, MD;  Location: [place];  Service: Vascular;  Laterality: Left;  FISTULA SUPERFICIALIZATION Right 08/21/2017  Procedure: FISTULA SUPERFICIALIZATION RIGHT ARM;  Surgeon: Jody Sandy, MD;  Location: [place];  Service: Vascular;  Laterality: Right;  INSERTION OF DIALYSIS CATHETER Right 04/28/2017  Procedure: INSERTION OF DIALYSIS CATHETER-RIGHT INTERNAL JUGULAR PLACEMENT;  Surgeon: Jody Dutch, MD;  Location: [place];  Service:  Vascular;  Laterality: Right;  LEFT HEART CATH AND CORONARY ANGIOGRAPHY N/A 01/18/2022  Procedure: LEFT HEART CATH AND CORONARY ANGIOGRAPHY;  Surgeon: Jody Sine, MD;  Location: [place] [facility];  Service: Cardiovascular;  Laterality: N/A;  LIGATION OF ARTERIOVENOUS  FISTULA Left 04/28/2017  Procedure: LIGATION OF ARTERIOVENOUS  FISTULA;  Surgeon: Jody Dutch, MD;  Location: [place];  Service: Vascular;  Laterality: Left;  MITRAL VALVE REPAIR Right 07/08/2015  Procedure: MINIMALLY INVASIVE MITRAL VALVE REPAIR with a 26 Sorin Memo 3D Rechord;  Surgeon: Jody Alberts, MD;  Location: [place];  Service: Open Heart Surgery;  Laterality: Right;  MULTIPLE EXTRACTIONS WITH ALVEOLOPLASTY N/A 06/30/2015  Procedure: MULTIPLE EXTRACTIONS OF TOOTH #'S 4  AND 30 WITH ALVEOLOPLASTY AND GROSS DEBRIDEMENT  OF REMAINING TEETH;  Surgeon: Jody Nguyen, DDS;  Location: [place];  Service: Oral Surgery;  Laterality: N/A;  PERIPHERAL VASCULAR CATHETERIZATION Left 03/28/2016  Procedure: A/V Fistulagram;  Surgeon: Jody Mitchell, MD;  Location: [place] [facility];  Service: Cardiovascular;  Laterality: Left;  lower arm  REPAIR OF PATENT FORAMEN OVALE N/A 01/01/2022  Procedure: REPAIR OF PATENT FORAMEN OVALE;  Surgeon: Jody Nakayama, MD;  Location: [place];  Service: Open Heart Surgery;  Laterality: N/A;  TEE WITHOUT CARDIOVERSION N/A 06/08/2015  Procedure: TRANSESOPHAGEAL ECHOCARDIOGRAM (TEE);  Surgeon: Lelon Perla, MD;  Location: [facility];  Service: Cardiovascular;  Laterality: N/A;  TEE WITHOUT CARDIOVERSION N/A 07/08/2015  Procedure: TRANSESOPHAGEAL ECHOCARDIOGRAM (TEE);  Surgeon: Jody Alberts, MD;  Location: [place];  Service: Open Heart Surgery;  Laterality: N/A;  TEE WITHOUT CARDIOVERSION N/A 01/12/2022  Procedure: TRANSESOPHAGEAL ECHOCARDIOGRAM (TEE);  Surgeon: Jody Nakayama, MD;  Location: [place];  Service: Open Heart Surgery;  Laterality: N/A;  TRICUSPID VALVE REPLACEMENT N/A 01/20/2022  Procedure: TRICUSPID  VALVE REPAIR USING AN 36MM EDWARDS MC3 TRICUSPID ANNULOPLASTY RING;  Surgeon: Jody Nakayama, MD;  Location: [place];  Service: Open Heart Surgery;  Laterality: N/A; HPI: 66 yo F presented [facility] ED 2[facility] with chest pain. ECHO revealed severe RV failure, felt to be  consistent with chronic process such as severe pulmonary HTN. 2/7 cardiac cath - three vessel dz;  2/10 CABGx3, TVR; 2/13 chest tube removed; ETT 2/10-2/16; CVVHD.  PMH ESRD on HD, non-obstructive CAD, suspected pulmonary hypertension, Hx PE on eliquis. Passed [place] on 2/16; RN had concerns for potential dysphagia and requested SLP swallow consult.  Subjective: alert and cooperative  Recommendations for follow up therapy are one component of a multi-disciplinary discharge planning process, led by the attending physician.  Recommendations may be updated based on patient status, additional functional criteria and insurance authorization. Assessment / Plan / Recommendation Clinical Impressions 01/23/2022 Clinical Impression Pt presents with a mild oropharyngeal dysphagia. She has some lingual rocking and delayed oral transit. Mastication is a little effortful but she clears her oral cavity of solid foods well. She was not able to clear a barium tablet from her oral cavity with purees or nectar thick liquids, ultimately chewing it to get rid of it.  However, this is a larger tablet that may be more challenging to swallow than some of her pills, including one in particular that per MD/RN report cannot be crushed. Pharyngeally she has mildly impaired timing that results in trace penetration to the vocal folds (PAS 5) with thin liquids. She was not observed to aspirate, but she also could not clear penetrates despite cues to cough/clear her throat. Attempted to use chin tuck but she physically and/or cognitively could not maintain this position. Also considering her mentation and that her positioning will not be as upright when she is on CRRT,  would start with Dys 3 diet and nectar thick liquids. If offering pills that can't be crushed, would offer them whole in puree but would monitor for oral clearance. SLP Visit Diagnosis Dysphagia, pharyngeal phase (R13.13) Attention and concentration deficit following -- Frontal lobe and executive function deficit following -- Impact on safety and function Mild aspiration risk   Treatment Recommendations 01/23/2022 Treatment Recommendations Therapy as outlined in treatment plan below   Prognosis 01/23/2022 Prognosis for Safe Diet Advancement Good Barriers to Reach Goals Cognitive deficits Barriers/Prognosis Comment -- Diet Recommendations 01/23/2022 SLP Diet Recommendations Dysphagia 3 (Mech soft) solids;Nectar thick liquid Liquid Administration via Cup;Straw Medication Administration Whole meds with puree Compensations Slow rate;Small sips/bites;Minimize environmental distractions Postural Changes Other (Comment)   Other Recommendations 01/23/2022 Recommended Consults -- Oral Care Recommendations Oral care BID Other Recommendations Prohibited food (jello, ice cream, thin soups);Remove water pitcher Follow Up Recommendations Acute inpatient rehab (3hours/day) Assistance recommended at discharge Frequent or constant Supervision/Assistance Functional Status Assessment Patient has had a recent decline in their functional status and demonstrates the ability to make significant improvements in function in a reasonable and predictable amount of time. Frequency and Duration  01/23/2022 Speech Therapy Frequency (ACUTE ONLY) min 2x/week Treatment Duration 2 weeks   Oral Phase 01/23/2022 Oral Phase Impaired Oral - Pudding Teaspoon -- Oral - Pudding Cup -- Oral - Honey Teaspoon -- Oral - Honey Cup -- Oral - Nectar Teaspoon -- Oral - Nectar Cup -- Oral - Nectar Straw Other (Comment) Oral - Thin Teaspoon WFL Oral - Thin Cup -- Oral - Thin Straw Premature spillage;Other (Comment) Oral - Puree Delayed oral transit Oral - Mech Soft --  Oral - Regular Impaired mastication;Delayed oral transit Oral - Multi-Consistency -- Oral - Pill Reduced posterior propulsion;Lingual/palatal residue Oral Phase - Comment --  Pharyngeal Phase 01/23/2022 Pharyngeal Phase Impaired Pharyngeal- Pudding Teaspoon -- Pharyngeal -- Pharyngeal- Pudding Cup -- Pharyngeal -- Pharyngeal- Honey Teaspoon -- Pharyngeal -- Pharyngeal- Honey Cup -- Pharyngeal -- Pharyngeal- Nectar Teaspoon -- Pharyngeal -- Pharyngeal- Nectar Cup -- Pharyngeal -- Pharyngeal- Nectar Straw Penetration/Aspiration before swallow Pharyngeal Material enters airway, remains ABOVE vocal cords then ejected out Pharyngeal- Thin Teaspoon WFL Pharyngeal -- Pharyngeal- Thin Cup -- Pharyngeal -- Pharyngeal- Thin Straw Penetration/Aspiration before swallow Pharyngeal Material enters airway, CONTACTS cords and not ejected out Pharyngeal- Puree WFL Pharyngeal -- Pharyngeal- Mechanical Soft -- Pharyngeal -- Pharyngeal- Regular WFL Pharyngeal -- Pharyngeal- Multi-consistency -- Pharyngeal -- Pharyngeal- Pill -- Pharyngeal -- Pharyngeal Comment --  Cervical Esophageal Phase  01/23/2022 Cervical Esophageal Phase WFL Pudding Teaspoon -- Pudding Cup -- Honey Teaspoon -- Honey Cup -- Nectar Teaspoon -- Nectar Cup -- Nectar Straw -- Thin Teaspoon -- Thin Cup -- Thin Straw -- Puree -- Mechanical Soft -- Regular -- Multi-consistency -- Pill -- Cervical Esophageal Comment -- [name]., M.A. [facility] Pager [phone] Office [phone] 01/23/2022, 5:15 PM  US BREAST LTD UNI RIGHT INC AXILLA  Result Date: 01/31/2022 CLINICAL DATA:  Right breast pain.  Assess for possible abscess. EXAM: ULTRASOUND OF THE RIGHT BREAST COMPARISON:  Prior screening mammograms. FINDINGS: Targeted ultrasound is performed, showing mildly hyperechoic tissue consistent with inflammation, no fluid collection to suggest an abscess. No mass. IMPRESSION: No evidence of breast abscess. RECOMMENDATION:  Clinical follow-up and treatment for the right breast symptoms. Recommend follow-up mammography, as soon as symptoms have improved, since this patient has not had screening mammography since 09/18/2019. I have discussed the findings and recommendations with the patient. If applicable, a reminder letter will be sent to the patient regarding the next appointment. BI-RADS CATEGORY  2: Benign. Electronically Signed   By: Lajean Manes M.D.   On: 01/31/2022 14:36   VAS US DUPLEX DIALYSIS ACCESS (AVF, AVG)  Result Date: 01/05/2022 DIALYSIS ACCESS Patient Name:  Jody Nguyen  Date of Exam:   01/05/2022 Medical Rec #: [id number]         Accession #:    [id number] Date of Birth: 1956/08/03          Patient Gender: F Patient Age:   6 years Exam Location:  [facility] Procedure:      VAS US DUPLEX DIALYSIS ACCESS (AVF, AVG) Referring Phys: Jen Mow --------------------------------------------------------------------------------  Reason for Exam: Infiltration at HD on tues. Swelling in RUE especially around                  AVF. Access Type: Radial-cephalic AVF. Limitations: Tissue properties Comparison Study: No prior study Performing Technologist: Maudry Mayhew MHA, RDMS, RVT, RDCS  Examination Guidelines: A complete evaluation includes B-mode imaging, spectral Doppler, color Doppler, and power Doppler as needed of all accessible portions of each vessel. Unilateral testing is considered an integral part of a complete examination. Limited examinations for reoccurring indications may be performed as noted.  Findings: +--------------------+----------+-----------------+--------+  AVF                  PSV (cm/s) Flow Vol (mL/min) Comments  +--------------------+----------+-----------------+--------+  Native artery inflow    110            302                  +--------------------+----------+-----------------+--------+  AVF Anastomosis         311                                  +--------------------+----------+-----------------+--------+  +------------+---------+------------+----------+-------------------------------+  OUTFLOW VEIN    PSV      Diameter   Depth (cm)            Describe                             (cm/s)       (cm)                                                 +------------+---------+------------+----------+-------------------------------+  Prox Forearm    42         2.46        0.18        change in Diameter and  aneurysmal             +------------+---------+------------+----------+-------------------------------+  Mid Forearm     93         0.63        0.46                                     +------------+---------+------------+----------+-------------------------------+  Dist Forearm    72         0.59        0.36                                     +------------+---------+------------+----------+-------------------------------+   Summary: Arteriovenous fistula-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 No to-fro flow identified to suggest pseudoaneurysm. *See table(s) above for measurements and observations.  Diagnosing physician: Monica Martinez MD Electronically signed by Monica Martinez MD on 01/05/2022 at 6:44:29 PM.   --------------------------------------------------------------------------------   Final    ECHOCARDIOGRAM COMPLETE  Result Date: 01/15/2022    ECHOCARDIOGRAM REPORT   Patient Name:   Jody Nguyen Date of Exam: 01/15/2022 Medical Rec #:  [id number]        Height:       59.5 in Accession #:    [id number]       Weight:       153.2 lb Date of Birth:  Apr 18, 1956         BSA:          1.656 m Patient Age:    53 years         BP:           132/76 mmHg Patient Gender: F                HR:           130 bpm. Exam Location:  Inpatient Procedure: 2D Echo Indications:    CHF-Acute Systolic  History:        Patient has prior history of Echocardiogram examinations, most                  recent 01/14/2022. CHF, Prior CABG, Pulmonary HTN; Risk                 Factors:Diabetes and Hypertension. 26 mm annuloplasty ring in                 mitral valve position. 36 mm Edwards MC3 annuloplasty ring in                 tricuspid valve position. ESRD. Hx CVA.  Sonographer:    Clayton Lefort RDCS (AE) Referring Phys: [id number] Hortencia Conradi MEIER IMPRESSIONS  1. Left ventricular ejection fraction, by estimation, is 50 to 55%. The left ventricle has low normal function. The left ventricle has no regional wall motion abnormalities. There is moderate left ventricular hypertrophy. Left ventricular diastolic parameters are indeterminate.  2. Ventricular septum is flattened in systole and diastole suggesting RV pressure and volume overload. . Right ventricular systolic function is severely reduced. The right ventricular size is severely enlarged. There is moderately elevated pulmonary artery systolic pressure.  3. There is a 26 mm prosthetic annuloplasty ring present in the mitral position. Moderate gradient across the repaired valve, mean gradient 8 mmHg. . The mitral valve has been repaired/replaced. No evidence of mitral valve regurgitation.  4. Tricuspid  valve annuloplasty 36 mm Edwards MC3 ring is in position at the TV anulus     . The tricuspid valve is has been repaired/replaced. Tricuspid valve regurgitation is severe.  5. The aortic valve has an indeterminant number of cusps. There is mild calcification of the aortic valve. There is mild thickening of the aortic valve. Aortic valve regurgitation is not visualized. No aortic stenosis is present.  6. The inferior vena cava is dilated in size with <50% respiratory variability, suggesting right atrial pressure of 15 mmHg. FINDINGS  Left Ventricle: Left ventricular ejection fraction, by estimation, is 50 to 55%. The left ventricle has low normal function. The left ventricle has no regional wall motion abnormalities. The left ventricular internal cavity size was normal  in size. There is moderate left ventricular hypertrophy. Left ventricular diastolic parameters are indeterminate. Right Ventricle: Ventricular septum is flattened in systole and diastole suggesting RV pressure and volume overload. The right ventricular size is severely enlarged. Right vetricular wall thickness was not well visualized. Right ventricular systolic function is severely reduced. There is moderately elevated pulmonary artery systolic pressure. The tricuspid regurgitant velocity is 3.22 m/s, and with an assumed right atrial pressure of 15 mmHg, the estimated right ventricular systolic pressure is 41.2  mmHg. Left Atrium: Left atrial size was not well visualized. Right Atrium: Right atrial size was not well visualized. Pericardium: There is no evidence of pericardial effusion. Mitral Valve: There is a 26 mm prosthetic annuloplasty ring present in the mitral position. Moderate gradient across the repaired valve, mean gradient 8 mmHg. The mitral valve has been repaired/replaced. No evidence of mitral valve regurgitation. MV peak  gradient, 16.5 mmHg. The mean mitral valve gradient is 9.0 mmHg. Tricuspid Valve: Tricuspid valve annuloplasty 36 mm Edwards MC3 ring is in position at the TV anulus. The tricuspid valve is has been repaired/replaced. Tricuspid valve regurgitation is severe. No evidence of tricuspid stenosis. Aortic Valve: The aortic valve has an indeterminant number of cusps. There is mild calcification of the aortic valve. There is mild thickening of the aortic valve. There is mild aortic valve annular calcification. Aortic valve regurgitation is not visualized. No aortic stenosis is present. Aortic valve mean gradient measures 4.0 mmHg. Aortic valve peak gradient measures 7.7 mmHg. Aortic valve area, by VTI measures 1.68 cm. Pulmonic Valve: The pulmonic valve was not well visualized. Pulmonic valve regurgitation is mild. No evidence of pulmonic stenosis. Aorta: The aortic root is normal in size  and structure. Venous: The inferior vena cava is dilated in size with less than 50% respiratory variability, suggesting right atrial pressure of 15 mmHg. IAS/Shunts: The interatrial septum is aneurysmal. The interatrial septum was not well visualized.  LEFT VENTRICLE PLAX 2D LVIDd:         3.70 cm LVIDs:         2.80 cm LV PW:         1.50 cm LV IVS:        1.50 cm LVOT diam:     1.80 cm LV SV:         30 LV SV Index:   18 LVOT Area:     2.54 cm  RIGHT VENTRICLE            IVC RV Basal diam:  3.40 cm    IVC diam: 2.20 cm RV S prime:     6.85 cm/s TAPSE (M-mode): 0.7 cm LEFT ATRIUM             Index  RIGHT ATRIUM           Index LA diam:        3.70 cm 2.23 cm/m   RA Area:     18.80 cm LA Vol (A2C):   67.2 ml 40.57 ml/m  RA Volume:   49.00 ml  29.58 ml/m LA Vol (A4C):   52.2 ml 31.51 ml/m LA Biplane Vol: 65.4 ml 39.48 ml/m  AORTIC VALVE AV Area (Vmax):    1.89 cm AV Area (Vmean):   1.66 cm AV Area (VTI):     1.68 cm AV Vmax:           139.00 cm/s AV Vmean:          90.600 cm/s AV VTI:            0.177 m AV Peak Grad:      7.7 mmHg AV Mean Grad:      4.0 mmHg LVOT Vmax:         103.00 cm/s LVOT Vmean:        59.200 cm/s LVOT VTI:          0.117 m LVOT/AV VTI ratio: 0.66  AORTA Ao Root diam: 3.00 cm Ao Asc diam:  3.20 cm MITRAL VALVE              TRICUSPID VALVE MV Area VTI:  0.78 cm    TR Peak grad:   41.5 mmHg MV Peak grad: 16.5 mmHg   TR Vmax:        322.00 cm/s MV Mean grad: 9.0 mmHg MV Vmax:      2.03 m/s    SHUNTS MV Vmean:     140.0 cm/s  Systemic VTI:  0.12 m                           Systemic Diam: 1.80 cm Carlyle Dolly MD Electronically signed by Carlyle Dolly MD Signature Date/Time: 01/15/2022/12:30:18 PM    Final    ECHOCARDIOGRAM COMPLETE  Result Date: 01/02/2022    ECHOCARDIOGRAM REPORT   Patient Name:   Jody Nguyen Date of Exam: 01/02/2022 Medical Rec #:  [id number]        Height:       59.5 in Accession #:    [id number]       Weight:       169.8 lb Date of Birth:  March 07, 1956          BSA:          1.730 m Patient Age:    76 years         BP:           103/74 mmHg Patient Gender: F                HR:           91 bpm. Exam Location:  Inpatient Procedure: 2D Echo, Cardiac Doppler and Color Doppler Indications:    Chest pain  History:        Patient has prior history of Echocardiogram examinations. CHF,                 Pulmonary HTN; Risk Factors:Hypertension and Diabetes.                  Mitral Valve: 26 mm prosthetic annuloplasty ring valve is                 present in the mitral  position.  Sonographer:    Jyl Heinz Referring Phys: [id number] [place]  1. Left ventricular ejection fraction, by estimation, is 40 to 45%. The left ventricle has mildly decreased function. The left ventricle has no regional wall motion abnormalities. There is moderate left ventricular hypertrophy. Left ventricular diastolic parameters are indeterminate.  2. Right ventricular systolic function is mildly reduced. The right ventricular size is mildly enlarged. There is mildly elevated pulmonary artery systolic pressure. The estimated right ventricular systolic pressure is 18.4 mmHg.  3. Left atrial size was mildly dilated.  4. The mitral valve has been repaired/replaced. No evidence of mitral valve regurgitation. Moderate mitral stenosis. The mean mitral valve gradient is 8.5 mmHg. There is a 26 mm prosthetic annuloplasty ring present in the mitral position.  5. Tricuspid valve regurgitation is severe. Moderate tricuspid stenosis.  6. The aortic valve is normal in structure. Aortic valve regurgitation is not visualized. No aortic stenosis is present.  7. The inferior vena cava is dilated in size with <50% respiratory variability, suggesting right atrial pressure of 15 mmHg. Comparison(s): No significant change from prior study. Prior images reviewed side by side. FINDINGS  Left Ventricle: Left ventricular ejection fraction, by estimation, is 40 to 45%. The left ventricle has mildly decreased  function. The left ventricle has no regional wall motion abnormalities. The left ventricular internal cavity size was normal in size. There is moderate left ventricular hypertrophy. Left ventricular diastolic parameters are indeterminate. Right Ventricle: The right ventricular size is mildly enlarged. No increase in right ventricular wall thickness. Right ventricular systolic function is mildly reduced. There is mildly elevated pulmonary artery systolic pressure. The tricuspid regurgitant  velocity is 2.48 m/s, and with an assumed right atrial pressure of 15 mmHg, the estimated right ventricular systolic pressure is 03.7 mmHg. Left Atrium: Left atrial size was mildly dilated. Right Atrium: Right atrial size was normal in size. Pericardium: There is no evidence of pericardial effusion. Mitral Valve: The mitral valve has been repaired/replaced. There is severe thickening of the mitral valve leaflet(s). There is severe calcification of the mitral valve leaflet(s). Mildly decreased mobility of the mitral valve leaflets. No evidence of mitral valve regurgitation. There is a 26 mm prosthetic annuloplasty ring present in the mitral position. Moderate mitral valve stenosis. MV peak gradient, 14.7 mmHg. The mean mitral valve gradient is 8.5 mmHg. Tricuspid Valve: The tricuspid valve is normal in structure. Tricuspid valve regurgitation is severe. Moderate tricuspid stenosis. Aortic Valve: The aortic valve is normal in structure. Aortic valve regurgitation is not visualized. Aortic regurgitation PHT measures 359 msec. No aortic stenosis is present. Aortic valve peak gradient measures 5.8 mmHg. Pulmonic Valve: The pulmonic valve was normal in structure. Pulmonic valve regurgitation is trivial. No evidence of pulmonic stenosis. Aorta: The aortic root is normal in size and structure. Venous: The inferior vena cava is dilated in size with less than 50% respiratory variability, suggesting right atrial pressure of 15 mmHg.  IAS/Shunts: There is redundancy of the interatrial septum. No atrial level shunt detected by color flow Doppler.  LEFT VENTRICLE PLAX 2D LVIDd:         5.10 cm      Diastology LVIDs:         4.40 cm      LV e' medial:    6.42 cm/s LV PW:         1.70 cm      LV E/e' medial:  24.1 LV IVS:        1.20  cm      LV e' lateral:   7.40 cm/s LVOT diam:     2.00 cm      LV E/e' lateral: 20.9 LV SV:         47 LV SV Index:   27 LVOT Area:     3.14 cm  LV Volumes (MOD) LV vol d, MOD A2C: 147.0 ml LV vol d, MOD A4C: 95.3 ml LV vol s, MOD A2C: 75.7 ml LV vol s, MOD A4C: 48.8 ml LV SV MOD A2C:     71.3 ml LV SV MOD A4C:     95.3 ml LV SV MOD BP:      59.2 ml RIGHT VENTRICLE            IVC RV Basal diam:  4.50 cm    IVC diam: 2.70 cm RV Mid diam:    4.00 cm RV S prime:     5.98 cm/s TAPSE (M-mode): 0.9 cm LEFT ATRIUM           Index        RIGHT ATRIUM           Index LA diam:      4.80 cm 2.77 cm/m   RA Area:     28.90 cm LA Vol (A2C): 61.3 ml 35.43 ml/m  RA Volume:   109.00 ml 63.00 ml/m LA Vol (A4C): 64.3 ml 37.16 ml/m  AORTIC VALVE AV Area (Vmax): 2.22 cm AV Vmax:        120.00 cm/s AV Peak Grad:   5.8 mmHg LVOT Vmax:      84.70 cm/s LVOT Vmean:     58.200 cm/s LVOT VTI:       0.149 m AI PHT:         359 msec  AORTA Ao Root diam: 2.60 cm Ao Asc diam:  3.30 cm MITRAL VALVE                TRICUSPID VALVE MV Area (PHT): 3.01 cm     TR Peak grad:   24.6 mmHg MV Area VTI:   1.08 cm     TR Vmax:        248.00 cm/s MV Peak grad:  14.7 mmHg MV Mean grad:  8.5 mmHg     SHUNTS MV Vmax:       1.92 m/s     Systemic VTI:  0.15 m MV Vmean:      140.0 cm/s   Systemic Diam: 2.00 cm MV Decel Time: 252 msec MV E velocity: 155.00 cm/s MV A velocity: 179.00 cm/s MV E/A ratio:  0.87 Candee Furbish MD Electronically signed by Candee Furbish MD Signature Date/Time: 01/02/2022/2:17:58 PM    Final    ECHO INTRAOPERATIVE TEE  Result Date: 01/09/2022  *INTRAOPERATIVE TRANSESOPHAGEAL REPORT *  Patient Name:   Jody Nguyen Date of Exam: 01/12/2022  Medical Rec #:  [id number]        Height:       59.5 in Accession #:    [id number]       Weight:       164.0 lb Date of Birth:  November 04, 1956         BSA:          1.71 m Patient Age:    39 years         BP:           102/65 mmHg Patient Gender: F  HR:           70 bpm. Exam Location:  Inpatient Transesophogeal exam was perform intraoperatively during surgical procedure. Patient was closely monitored under general anesthesia during the entirety of examination. Indications:     CABG                  Possible tricuspid valve replacement Performing Phys: Whittemore C HENDRICKSON Diagnosing Phys: Hoy Morn MD Complications: No known complications during this procedure. POST-OP IMPRESSIONS _ Left Ventricle: The cavity size was mildly dilated. The wall motion is abnormal with regional variation. _ Right Ventricle: moderately reduced function. The cavity was moderately dilated. The wall motion is abnormal. _ Aorta: The aorta appears unchanged from pre-bypass. _ Left Atrial Appendage: The left atrial appendage appears unchanged from pre-bypass. _ Aortic Valve: The aortic valve appears unchanged from pre-bypass. _ Mitral Valve: There is trivial regurgitation. _ Tricuspid Valve: There is severe regurgitation. Severe regurgitation post repair. _ Pulmonic Valve: The pulmonic valve appears unchanged from pre-bypass. _ Interatrial Septum: The interatrial septum appears unchanged from pre-bypass. _ Pericardium: The pericardium appears unchanged from pre-bypass. _ Comments: Post-bypass images reviewed with surgeon. Patient on epinephrine, levophed, vasopressin, and milrinone infusions post-bypass. PRE-OP FINDINGS  Left Ventricle: The left ventricle has mild-moderately reduced systolic function, with an ejection fraction of 40-45%. The cavity size was mildly dilated. Moderate hypokinesis of the left ventricular, mid inferior wall. There is moderate concentric left  ventricular hypertrophy.  LV Wall Scoring: The mid  inferior segment is hypokinetic.  Right Ventricle: The right ventricle has mildly reduced systolic function. The cavity was dialated. There is no increase in right ventricular wall thickness. Left Atrium: Left atrial size was dilated. No left atrial/left atrial appendage thrombus was detected. Left atrial appendage velocity is reduced at less than 40 cm/s. Right Atrium: Right atrial size was dilated. Interatrial Septum: Evidence of atrial level shunting detected by color flow Doppler. A small patent foramen ovale is detected with predominantly left to right shunting across the atrial septum. Pericardium: There is no evidence of pericardial effusion. Mitral Valve: The mitral valve has been repaired/replaced. Mitral valve regurgitation is not visualized by color flow Doppler. There is Mild mitral stenosis. Tricuspid Valve: The tricuspid valve was normal in structure. Tricuspid valve regurgitation is severe by color flow Doppler. Moderate tricuspid stenosis is present. The tricuspid valve is status post repair with an annuloplasty ring. Aortic Valve: The aortic valve is tricuspid Aortic valve regurgitation was not visualized by color flow Doppler. Pulmonic Valve: The pulmonic valve was normal in structure. Pulmonic valve regurgitation is not visualized by color flow Doppler. Aorta: The aortic root, ascending aorta and aortic arch are normal in size and structure. Pulmonary Artery: The pulmonary artery is moderately dilated. +--------------+-------++  LEFT VENTRICLE           +--------------+-------++  PLAX 2D                  +--------------+-------++  LVIDd:         5.90 cm   +--------------+-------++  LVIDs:         4.40 cm   +--------------+-------++  LV SV:         86 ml     +--------------+-------++  LV SV Index:   47.50     +--------------+-------++                           +--------------+-------++ +-------------+---------++ +---------------+-----------++  MITRAL VALVE  TRICUSPID VALVE                +-------------+---------++ +---------------+-----------++  MV Peak grad: 7.2 mmHg     TV Peak grad:   3.6 mmHg      +-------------+---------++ +---------------+-----------++  MV Mean grad: 3.0 mmHg     TV Mean grad:   2.0 mmHg      +-------------+---------++ +---------------+-----------++  MV Vmax:      1.34 m/s     TV Vmax:        0.94 m/s      +-------------+---------++ +---------------+-----------++  MV Vmean:     76.2 cm/s    TV Vmean:       58.6 cm/s     +-------------+---------++ +---------------+-----------++  MV VTI:       0.45 m       TV VTI:         0.24 msec     +-------------+---------++ +---------------+-----------++                             TR Peak grad:   28.7 mmHg                                +---------------+-----------++                             TR Mean grad:   11.0 mmHg                                +---------------+-----------++                             TR Vmax:        268.00 cm/s                              +---------------+-----------++                             TR Vmean:       147.0 cm/s                               +---------------+-----------++  Hoy Morn MD Electronically signed by Hoy Morn MD Signature Date/Time: 01/09/2022/9:48:50 AM    Final    VAS US DOPPLER PRE CABG  Result Date: 01/05/2022 PREOPERATIVE VASCULAR EVALUATION Patient Name:  Jody Nguyen  Date of Exam:   01/05/2022 Medical Rec #: [id number]         Accession #:    [id number] Date of Birth: 1956-10-10          Patient Gender: F Patient Age:   25 years Exam Location:  [facility] Procedure:      VAS US DOPPLER PRE CABG Referring Phys: Remo Lipps HENDRICKSON --------------------------------------------------------------------------------  Indications:      Pre-CABG. Risk Factors:     Hypertension, hyperlipidemia, Diabetes, prior CVA. Limitations:      patient movement Comparison Study: no prior Performing Technologist: Archie Patten RVS  Examination Guidelines: A complete evaluation includes  B-mode imaging, spectral Doppler, color Doppler, and power Doppler as needed of all accessible  portions of each vessel. Bilateral testing is considered an integral part of a complete examination. Limited examinations for reoccurring indications may be performed as noted.  Right Carotid Findings: +----------+--------+--------+--------+------------+--------+             PSV cm/s EDV cm/s Stenosis Describe     Comments  +----------+--------+--------+--------+------------+--------+  CCA Prox   53       11                heterogenous           +----------+--------+--------+--------+------------+--------+  CCA Distal 60       14                heterogenous           +----------+--------+--------+--------+------------+--------+  ICA Prox   50       18       1-39%    heterogenous           +----------+--------+--------+--------+------------+--------+  ICA Distal 45       15                                       +----------+--------+--------+--------+------------+--------+  ECA        46                                                +----------+--------+--------+--------+------------+--------+ +----------+--------+-------+--------+------------+             PSV cm/s EDV cms Describe Arm Pressure  +----------+--------+-------+--------+------------+  Subclavian 101      22                             +----------+--------+-------+--------+------------+ +---------+--------+--+--------+-+---------+  Vertebral PSV cm/s 30 EDV cm/s 8 Antegrade  +---------+--------+--+--------+-+---------+ Left Carotid Findings: +----------+--------+--------+--------+------------+--------+             PSV cm/s EDV cm/s Stenosis Describe     Comments  +----------+--------+--------+--------+------------+--------+  CCA Prox   103      19                heterogenous           +----------+--------+--------+--------+------------+--------+  CCA Distal 67       18                heterogenous            +----------+--------+--------+--------+------------+--------+  ICA Prox   93       30       1-39%    heterogenous           +----------+--------+--------+--------+------------+--------+  ICA Distal 60       22                                       +----------+--------+--------+--------+------------+--------+  ECA        34                                                +----------+--------+--------+--------+------------+--------+ +----------+--------+--------+--------+------------+  Subclavian PSV cm/s EDV cm/s Describe Arm Pressure  +----------+--------+--------+--------+------------+             45                                       +----------+--------+--------+--------+------------+ +---------+--------+--+--------+-+---------+  Vertebral PSV cm/s 34 EDV cm/s 9 Antegrade  +---------+--------+--+--------+-+---------+  ABI Findings: +---------+------------------+-----+---------+---------------------------------+  Right     Rt Pressure (mmHg) Index Waveform  Comment                            +---------+------------------+-----+---------+---------------------------------+  Brachial                           triphasic fistula                            +---------+------------------+-----+---------+---------------------------------+  PTA       113                1.20  biphasic                                     +---------+------------------+-----+---------+---------------------------------+  DP        106                1.13  triphasic                                    +---------+------------------+-----+---------+---------------------------------+  Great Toe                                    unable to obtain waveform or                                                     pressure due to paient constant                                                  movement                           +---------+------------------+-----+---------+---------------------------------+  +---------+------------------+-----+---------+---------------------------------+  Left      Lt Pressure (mmHg) Index Waveform  Comment                            +---------+------------------+-----+---------+---------------------------------+  Brachial  94                       triphasic                                    +---------+------------------+-----+---------+---------------------------------+  PTA  unable to obtain waveform or                                                     pressure due to paient constant                                                  movement                           +---------+------------------+-----+---------+---------------------------------+  DP        121                1.29  triphasic                                    +---------+------------------+-----+---------+---------------------------------+  Great Toe                                    unable to obtain waveform or                                                     pressure due to paient constant                                                  movement                           +---------+------------------+-----+---------+---------------------------------+ +-------+---------------+----------------+  ABI/TBI Today's ABI/TBI Previous ABI/TBI  +-------+---------------+----------------+  Right   1.20                              +-------+---------------+----------------+  Left    1.29                              +-------+---------------+----------------+  Right Doppler Findings: +--------+--------+-----+---------+--------+  Site     Pressure Index Doppler   Comments  +--------+--------+-----+---------+--------+  Brachial                triphasic fistula   +--------+--------+-----+---------+--------+  Radial                            fistula   +--------+--------+-----+---------+--------+  Ulnar                             fistula   +--------+--------+-----+---------+--------+   Left Doppler Findings: +--------+--------+-----+---------+--------+  Site     Pressure Index Doppler   Comments  +--------+--------+-----+---------+--------+  Brachial 94  triphasic           +--------+--------+-----+---------+--------+  Radial                  triphasic           +--------+--------+-----+---------+--------+  Ulnar                   triphasic           +--------+--------+-----+---------+--------+  Summary: Right Carotid: Velocities in the right ICA are consistent with a 1-39% stenosis. Left Carotid: Velocities in the left ICA are consistent with a 1-39% stenosis. Vertebrals: Bilateral vertebral arteries demonstrate antegrade flow. Right ABI: Resting right ankle-brachial index is within normal range. No evidence of significant right lower extremity arterial disease. Left ABI: Resting left ankle-brachial index is within normal range. No evidence of significant left lower extremity arterial disease. Right Upper Extremity: Unable to obtain allens test due to fistula. Left Upper Extremity: Doppler waveforms remain within normal limits with left radial compression. Doppler waveforms remain within normal limits with left ulnar compression.  Electronically signed by Monica Martinez MD on 01/05/2022 at 6:34:02 PM.    Final     Labs:  CBC: Recent Labs    01/29/22 0345 01/30/22 0345 01/30/22 2208 01/31/22 0439 02/01/22 0344  WBC 21.4* 19.8*  --  15.8* 15.6*  HGB 8.1* 8.2* 9.5* 8.0* 7.9*  HCT 27.2* 26.7* 28.0* 25.9* 26.4*  PLT 159 160  --  185 199    COAGS: Recent Labs    01/17/2022 1730 01/08/22 0358 01/09/22 0509 01/10/22 0623 01/11/22 0418 01/11/22 1609 01/24/22 0354 01/31/2022 0415 02/09/2022 1420 01/26/22 0322  INR 1.9*  --  1.7* 1.5* 1.5*  --   --   --   --   --   APTT 41*   < > 41* 40*  --    < > 46* 49* 54* 53*   < > = values in this interval not displayed.    BMP: Recent Labs    04/21/21 1614 08/28/21 0118 01/30/22 0345 01/30/22 1750 01/30/22 2208  01/31/22 0439 02/01/22 0344  NA 138   < > 135 136 137 137 135  K 4.7   < > 4.2 4.0 4.3 4.1 4.5  CL 91*   < > 104 104  --  104 102  CO2 34*   < > 22 25  --  24 23  GLUCOSE 105*   < > 131* 95  --  155* 151*  BUN 45*   < > 11 11  --  12 25*  CALCIUM 9.7   < > 9.4 9.5  --  9.4 9.7  CREATININE 7.75*   < > 0.87 0.95  --  1.11* 2.13*  GFRNONAA 5*   < > >60 >60  --  55* 25*  GFRAA 6*  --   --   --   --   --   --    < > = values in this interval not displayed.    LIVER FUNCTION TESTS: Recent Labs    01/10/22 0623 01/10/22 1509 01/11/22 0418 01/11/22 1609 01/14/22 0458 01/14/22 0729 01/23/22 0432 01/23/22 1730 01/30/22 0345 01/30/22 1750 01/31/22 0439 02/01/22 0344  BILITOT 1.0  --  1.4*  --  1.3*  --  0.6  --   --   --   --   --   AST 47*  --  50*  --  56*  --  99*  --   --   --   --   --  ALT 6  --  7  --  14  --  78*  --   --   --   --   --   ALKPHOS 107  --  100  --  132*  --  111  --   --   --   --   --   PROT 5.9*  --  5.8*  --  6.3*  --  6.4*  --   --   --   --   --   ALBUMIN 3.2*   < > 3.2*   < > 2.7*   < > 2.2*   2.4*   < > 2.3* 2.3* 2.3* 2.3*   < > = values in this interval not displayed.    TUMOR MARKERS: No results for input(s): AFPTM, CEA, CA199, CHROMGRNA in the last 8760 hours.  Assessment and Plan: History significant for ESRD on HD, CAD, HTN, PE (Eliquis), HLD, DM II, s/p cardiac cath, dyspnea and CVA. Pt was admitted after presenting to ED 01/21/2022 c/o CP, nausea and SOB. Pt had CABG x 3 and PFO closure 2/10. Pt has had continuing issues with access including her AVF that has infiltrated. Pt currently on CRRT. Dr. Ina Nguyen has referred pt to IR to have LIJ tunneled catheter placed d/t not having other viable access. Procedure is tentatively scheduled for 02/02/22.   Pt sitting up in bed, RN at bedside feeding her pineapple bites.   Pt is only oriented to self, unable to give DOB. She is in no distress.  Orders placed for NPO at [facility].  WBC 15.6, afebrile  Pt  husband was contacted and provided telephone consent for procedure.   Risks and benefits of image guided tunneled catheter placement was discussed with the patient and husband including, but not limited to bleeding, infection, pneumothorax, or fibrin sheath development and need for additional procedures.  All of the patient's husband's questions were answered, husband is agreeable to proceed.  Consent signed and in IR.   Thank you for this interesting consult.  I greatly enjoyed meeting JAQUASIA DOSCHER and look forward to participating in their care.  A copy of this report was sent to the requesting provider on this date.  Electronically Signed: Tyson Alias, NP 02/01/2022, 9:29 AM   I spent a total of 20 minutes in face to face in clinical consultation, greater than 50% of which was counseling/coordinating care for image guided tunneled catheter placement.

## 2022-02-01 NOTE — Progress Notes (Signed)
ANTICOAGULATION CONSULT NOTE ? ?Pharmacy Consult for bivalirudin>>heparin ?Indication: history of pulmonary embolus 2021 ? ?Allergies  ?Allergen Reactions  ? Minoxidil Other (See Comments)  ?  Pericardial effusion  ? Lipitor [Atorvastatin] Other (See Comments)  ?  MYALGIAS > "pain in legs" ?Tolerates rosuvastatin  ? Morphine And Related Itching  ? Ace Inhibitors Other (See Comments)  ?  REACTION: "not sure...think it made me drowsy all the time"  ? ? ?Patient Measurements: ?Height: 4' 11.5" (151.1 cm) ?Weight: 70.1 kg (154 lb 8.7 oz) ?IBW/kg (Calculated) : 44.35 ?Heparin DW: 70.2kg  ? ?Vital Signs: ?Temp: 98.3 ?F (36.8 ?C) (03/08 1137) ?Pulse Rate: 82 (03/08 0800) ? ?Labs: ?Recent Labs  ?  01/30/22 ?0345 01/30/22 ?1750 01/30/22 ?2208 01/31/22 ?3832 02/01/22 ?9191 02/01/22 ?1322  ?HGB 8.2*  --  9.5* 8.0* 7.9*  --   ?HCT 26.7*  --  28.0* 25.9* 26.4*  --   ?PLT 160  --   --  185 199  --   ?HEPARINUNFRC 0.25*  --   --  0.41 0.51 0.46  ?CREATININE 0.87 0.95  --  1.11* 2.13*  --   ? ? ? ?Estimated Creatinine Clearance: 22.4 mL/min (A) (by C-G formula based on SCr of 2.13 mg/dL (H)). ? ? ?Assessment: ?66 yo female on chronic apixaban for hx of PE 2021. Apixaban has been held for surgery.  Pharmacy asked to resume anticoagulation post op 2/15 with bivalirudin.  HIT antibody and SRA both negative.  Platelet count now recovered 50>208>150-160s.  ? ?Patient on heparin (s/p bivalirudin) as HIT work-up negative. Heparin level therapeutic at 0.46 on 1150 units/hr, H/H stable, pltc continues to improve. Patient tolerated full HD today, level s/p HD.  ? ?Goal of Therapy:  ?Heparin level 0.3-0.5 - target lower end given low platelet count/risk of post-operative bleeding ?Monitor platelets by anticoagulation protocol: Yes ?  ?Plan:  ?-Continue heparin 1150 units/h ?-Daily heparin level and CBC ? ?Cathrine Muster, PharmD ?PGY2 Cardiology Pharmacy Resident ?Phone: [phone] ?02/01/2022  3:41 PM ? ?Please check [email or web address] for  unit-specific pharmacy phone numbers. ? ? ?

## 2022-02-01 NOTE — Progress Notes (Signed)
eLink Physician-Brief Progress Note ?Patient Name: Jody Nguyen ?DOB: [birth year] ?MRN: [id number] ? ? ?Date of Service ? 02/01/2022  ?HPI/Events of Note ? Nursing request for renal function panel in AM.  ?eICU Interventions ? Will order renal function panel at 5 AM.  ? ? ? ?Intervention Category ?Major Interventions: Other: ? ?Joshalyn Ancheta Cornelia Copa ?02/01/2022, 10:49 PM ?

## 2022-02-01 NOTE — Progress Notes (Signed)
? ?NAME:  Jody Nguyen, MRN:  [id number], DOB:  July 10, 1956, LOS: 26 ?ADMISSION DATE:  01/12/2022, CONSULTATION DATE:  01/22/2022 ?REFERRING MD:  Marlowe Sax - TRH, CHIEF COMPLAINT:  SOB  ? ?History of Present Illness:  ?66 year old woman who presented to [facility] ED 2/4 with CP radiating to her arm. PMHx significant for  ESRD (on HD), non-obstructive CAD, suspected pulmonary hypertension, PE (on Eliquis). Recent ED presentation 2/1 for similar symptoms (troponin 100s). F/u with Cardiology as an outpatient 2/2 and CP was felt to be related to volume shift (occurring during dialysis).  ? ?On ED presentation 2/4, patient reported episodic chest pain, non-exertional and intermittent x 1 week with associated nausea and SOB. Non-specific EKG changes. Cardiology consulted, STEMI ruled out. Troponin elevated at 200. Placed on [place] for SpO2 80s. Admitted to [facility] for observation with concern for PE and started on heparin gtt. VQ with low probability for PE. Echo 2/19 demonstrated severe RV/RA enlargement, severely reduced RV function and septal bowing; felt to be consistent with chronic process such as severe pulmonary HTN.  ?  ?PCCM has been consulted in this setting. ? ?Pertinent Medical History:  ?ESRD ?PE ?Pulmonary HTN  ?CAD ?CVA ?HLD ?HTN ?S/p MV repair  ?Pericardial effusion r/t minoxidil  ?Diastolic HF ?DM2 ? ?Significant Hospital Events: ?Including procedures, antibiotic start and stop dates in addition to other pertinent events   ?2/4 ED presentation with recurring chest pain -- STEMI ruled out. Stable, mildly hypoxic and placed on [place] ?2/5 Admit to [facility] for obvs. Concern for PE, ECHO performed by cardiology reportedly shows RV failure, but felt more consistent with chronic process like severe pulmHTN. Awaiting VQ scan. Pulm consulted  ?2/7 Cardiac cath. 3 vessel dz. CABG rec ?2/10 CABG, TVR ?2/11 Continued vasoplegic shock with very high vasopressor requirements  ?2/12 Given methylene blue x 2 ?2/13 Chest tube removed ?2/14 JP  removed ?2/15 pacing wires removed ?2/16 milrinone stopped, extubated ?2/17 Zosyn for GNR bacteremia-> Morganella morganii ?2/18 Arterial line, central line, vascath all removed and replaced. ?2/19 Milrinone back on. Co-ox improved. ECHO Left ventricular ejection fraction, by estimation, is 50 to 55%. The left ventricle has low normal function. No regional WM abnormality. Right Ventricle: Ventricular septum is flattened in systole and diastole  suggesting RV pressure and volume overload. The right ventricular size is  severely enlarged. Right vetricular wall thickness was not well  visualized. Right ventricular systolic  function is severely reduced. There is moderately elevated pulmonary  ?artery systolic pressure ?2/20 still high pressor needs. CRRT stopped. Having intermittent bouts of tachycardia ?2/21 Hemodynamics better. Weaning Epi. Still on Norepi and inotropic support. Supported on BIPAP over night and tolerated well. On am rounds more lethargic w/ generalized pain everywhere. Co-ox > 70. Glucose poorly controlled. ?2/22 Escalating pressor requirements (NE, Epi, Vaso). Continues on milrinone. Afib with RVR overnight, now with persistent AF (on amio, bival). Co-ox 73%. Hgb 7.3. WBC uptrending (25 from 20), Cr uptrending, CRRT restarted. Tolerating BiPAP. ?2/23 Improving pressor needs. Stable Epi/Vaso, downtitrating NE. Milrinone 0.25. Persistent AF, rate controlled. Co-ox 65.7%. Ongoing CRRT. BiPAP QHS. ?2/24 Continued decreasing pressor needs, stable epi/vaso, downtitrating Levo.  Milrinone 0.25.  Intermittent A-fib, in and out of NSR, rates 90s.  Co-ox 76.1%.  Ongoing CRRT, did not utilize BiPAP overnight. FEES with SLP. ?2/25 confused.  Pressor requirements down some.  Got 1 unit of blood for hemoglobin of 7, Co. ox dropped. Vasopressin decreased to 0.03 units.  ?3/2 stopped epinephrine, Co. oximetry dropped quickly ?3/3 back  on epinephrine, with plan to eventually titrate vasopressin off and trial  dobutamine.  Currently on room air.  Co. oximetry's in the 40s, hypoglycemic requiring titration down on basal dose insulin after changing tube feeds to nocturnal ?3/6 off Epi since 3 am  ? ?Interim History / Subjective:  ?Looks better today. ?Some confusion yesterday self-resolved. ?On iHD! ? ?Objective:  ?Blood pressure (!) 75/53, pulse 82, temperature 97.9 ?F (36.6 ?C), resp. rate 19, height 4' 11.5" (1.511 m), weight 70.5 kg, SpO2 94 %. ?PAP: (42-55)/(20-30) 45/23 ?CVP:  [10 mmHg-21 mmHg] 13 mmHg ?CO:  [4.2 L/min-13 L/min] 4.9 L/min ?CI:  [2.5 L/min/m2-3 L/min/m2] 3 L/min/m2  ?   ? ?Intake/Output Summary (Last 24 hours) at 02/01/2022 0817 ?Last data filed at 02/01/2022 0400 ?Gross per 24 hour  ?Intake 1453 ml  ?Output 771 ml  ?Net 682 ml  ? ? ?Filed Weights  ? 01/31/22 0500 02/01/22 0600 02/01/22 0730  ?Weight: 70.2 kg 70.5 kg 70.5 kg  ? ?Physical Examination: ?No distress ?R breast less tender ?Ext warm, trace edema ?Lungs are diminished at bases ?Moves all 4 ext to command ?Groin lines CDI ?Thrombosed R arm fistula ? ?CBC stable anemia and leukocytosis ? ?Assessment & Plan:  ?Acute decompensated R>L heart failure  ?with cardiogenic shock ?Mixed pulmonary HTN leading to above ?Atrial fibrillation on AC and amio ?Acute hypoxemic resp failure in setting of advanced multifactorial pulmonary HTN ?ICU delirium ?H/O PE ?CAD post CABG x3, TVR, PFO closure 12/29/2021 ?ESRD on HD- thrombosed AVF ?Probable renal vasoplegia complicating shock picture ?Dysphagia ?Anemia of chronic renal failure ?DM2 with hyperglycemia currently on levemir + SSI ?Morganella bacteremia treated ?Anemia stable ? ?- Vanc/cefepime x 7 days for sepsis ?- Decrease levemir again from 15 to 12 ?- PRN phenergan ?- Continue midodrine 15 mg per tube Q 6 hours  ?- Amio per TCTS and CHF team ?- when can we try iHD? Will ask nephro/CHF ?- US breast neg ?- Encourage PO, continue nocturnal tube feeds while not taking enough PO ?- If tolerates iHD, IR consult for  tunneled catheter and remove all groin lines ? ? ?Best Practice: (right click and "Reselect all SmartList Selections" daily)  ? ?Diet/type: tubefeeds  , dysphagia 3 ?DVT prophylaxis: other- bivalirudin ?GI prophylaxis: PPI ?Lines: Central line, Dialysis Catheter, Arterial Line, and yes and it is still needed  ?Foley:  N/A ?Code Status:  full code ?Last date of multidisciplinary goals of care discussion [Husband updated 3/6 at bedside]  ? ?ICU today likely can move out tomorrow ? ?Erskine Emery MD PCCM ?

## 2022-02-01 NOTE — Progress Notes (Signed)
?[facility] ?Progress Note  ? ?Assessment/ Plan:   ?OP HD: MWF [facility] ?3:15h   450/A1.5   75.5kg   2K/2Ca bath  P2   AVF  Hep  2500 ? - COVID neg 2/04, hep B SAg neg on 01/03/22 ?- Hectoral 56mg IV q HD ?- Sensipar 930mPO q HD ?- Mircera 15038mIV q 2 weeks (ordered, not yet given. Last Mircera 100 on 12/07/21) ?  ?  ?  ?Assessment/Plan:  ? CAD s/p CABG x3, TR sp TVR:  per CT surgery  ? ESRD:  normally MWF schedule. CRRT started on 2/07, will dc today. HD in ICU today in progress.  ?Shock - cardiogenic and septic. +RV failure- RHC 02/12/2022-->  well-compensated hemodynamics. Continues on midodrine tid. Off all pressors and inotropes.   ?Morganella bacteremia -  s/p Zosyn ? Anemia CKD: Sq darbe 100ug weekly on Saturday. 1st 2/25. ? Metabolic bone disease: resume home binders and sensipar when taking PO. Cont IV vdra.  ? Infiltrated AVF - AVF infiltrated on 2/7. Post-op AVF is nonfunctional, VVS aware. [facility].  ?[facility]m [facility]  [facility] ? T2DM-per primary service ? Hx of MV replacement - in 2016 ?Dispo: in ICU ? ?RobKelly SplinterD ?02/01/2022, 12:08 PM ? ? ? ? ?Subjective:   ? ?Seen in room, no c/o  ? ?Objective:   ?BP (!) 75/53 Comment: following aline  Pulse 82   Temp 98.3 ?F (36.8 ?C)   Resp (!) 29   Ht 4' 11.5" (1.511 m)   Wt 70.1 kg   SpO2 100%   BMI 30.69 kg/m?  ? ?Physical Exam: ?GenKNL:ZJQBHortrak in place, eyes open, nad ?CVS: reg RR ?Resp: coarse ?Abd: soft obese nontender ?Ext: 1+ RUE edema, no other edema ?ACCESS: nontunneled HD cath R fem ? ?Labs: ?BMET ?Recent Labs  ?Lab 01/28/22 ?1610 01/29/22 ?0345 01/29/22 ?1518 01/30/22 ?0345 01/30/22 ?1750 01/30/22 ?2208 01/31/22 ?[id number]/08/23 ?[id number] 137 135 135 135 136 137 137 135  ?K 4.3 4.7 4.2 4.2 4.0 4.3 4.1 4.5  ?CL 103 102 102 104 104  --  104 102  ?CO2 _0 --  24 23  ?GLUCOSE 130* 240* 148* 131* 95  --  155* 151*  ?BUN _1 --  12 25*  ?CREATININE 1.01* 1.12*  0.97 0.87 0.95  --  1.11* 2.13*  ?CALCIUM 9.8 9.7 9.2 9.4 9.5  --  9.4 9.7  ?PHOS 2.1* 1.7* 3.8 2.3* 2.0*  --  1.8* 2.9  ? ? ?CBC ?Recent Labs  ?Lab 01/29/22 ?0345 01/30/22 ?0345 01/30/22 ?2208 01/31/22 ?[id number]/08/23 ?[id number] 21.4* 19.8*  --  15.8* 15.6*  ?NEUTROABS 17.2* 14.4*  --  10.4* 9.9*  ?HGB 8.1* 8.2* 9.5* 8.0* 7.9*  ?HCT 27.2* 26.7* 28.0* 25.9* 26.4*  ?MCV 102.3* 101.5*  --  101.2* 102.3*  ?PLT 159 160  --  185 199  ? ? ? ?  ?Medications:   ? ? amiodarone  200 mg Oral BID  ? aspirin EC  325 mg Oral Daily  ? Or  ? aspirin  324 mg Per Tube Daily  ? chlorhexidine gluconate (MEDLINE KIT)  15 mL Mouth Rinse BID  ? Chlorhexidine Gluconate Cloth  6 each Topical Daily  ? darbepoetin (ARANESP) injection - NON-DIALYSIS  100 mcg Subcutaneous Q Sat-1800  ? doxercalciferol  9 mcg Intravenous Q M,W,F-HD  ? ezetimibe  10 mg  Oral Daily  ? feeding supplement (PROSource TF)  45 mL Per Tube BID  ? feeding supplement (VITAL 1.5 CAL)  600 mL Per Tube Q24H  ? insulin aspart  0-20 Units Subcutaneous Q4H  ? insulin detemir  12 Units Subcutaneous BID  ? latanoprost  1 drop Both Eyes QHS  ? midodrine  15 mg Oral Q6H  ? multivitamin  1 tablet Per Tube QHS  ? pantoprazole sodium  40 mg Per Tube Daily  ? rosuvastatin  10 mg Per Tube QHS  ? sodium chloride flush  10-40 mL Intracatheter Q12H  ? sodium chloride flush  3 mL Intravenous Q12H  ? vancomycin variable dose per unstable renal function (pharmacist dosing)   Does not apply See admin instructions  ? ? ? ? ?

## 2022-02-01 NOTE — Progress Notes (Signed)
ANTICOAGULATION CONSULT NOTE ? ?Pharmacy Consult for bivalirudin>>heparin ?Indication: history of pulmonary embolus 2021 ? ?Allergies  ?Allergen Reactions  ? Minoxidil Other (See Comments)  ?  Pericardial effusion  ? Lipitor [Atorvastatin] Other (See Comments)  ?  MYALGIAS > "pain in legs" ?Tolerates rosuvastatin  ? Morphine And Related Itching  ? Ace Inhibitors Other (See Comments)  ?  REACTION: "not sure...think it made me drowsy all the time"  ? ? ?Patient Measurements: ?Height: 4' 11.5" (151.1 cm) ?Weight: 70.2 kg (154 lb 12.2 oz) ?IBW/kg (Calculated) : 44.35 ?Heparin DW: 70.2kg  ? ?Vital Signs: ?Temp: 98.6 ?F (37 ?C) (03/08 0500) ?Temp Source: Core (03/07 2100) ?Pulse Rate: 81 (03/08 0400) ? ?Labs: ?Recent Labs  ?  01/30/22 ?0345 01/30/22 ?1750 01/30/22 ?2208 01/31/22 ?9323 02/01/22 ?5573  ?HGB 8.2*  --  9.5* 8.0* 7.9*  ?HCT 26.7*  --  28.0* 25.9* 26.4*  ?PLT 160  --   --  185 199  ?HEPARINUNFRC 0.25*  --   --  0.41 0.51  ?CREATININE 0.87 0.95  --  1.11* 2.13*  ? ? ? ?Estimated Creatinine Clearance: 22.4 mL/min (A) (by C-G formula based on SCr of 2.13 mg/dL (H)). ? ? ?Assessment: ?66 yo female on chronic apixaban for hx of PE 2021. Apixaban has been held for surgery.  Pharmacy asked to resume anticoagulation post op 2/15 with bivalirudin.  HIT antibody and SRA both negative.  Platelet count now recovered 50>208>150-160s.  ? ?Patient on heparin (s/p bivalirudin) as HIT work-up negative. Heparin level slightly elevated at 0.51 on 1200 units/hr, H/H stable, pltc continues to improve.Suspect accumulation now that CRRT has been stopped. Patient to transition to iHD starting today. Will reduce slightly.  ? ?Goal of Therapy:  ?Heparin level 0.3-0.5 - target lower end given low platelet count/risk of post-operative bleeding ?Monitor platelets by anticoagulation protocol: Yes ?  ?Plan:  ?-Decrease heparin 1150 units/h ?-Heparin level in 6 hours ?-Daily heparin level and CBC ? ?Cathrine Muster, PharmD ?PGY2 Cardiology  Pharmacy Resident ?Phone: [phone] ?02/01/2022  6:24 AM ? ?Please check [email or web address] for unit-specific pharmacy phone numbers. ? ? ?

## 2022-02-01 NOTE — Progress Notes (Signed)
SLP Cancellation Note ? ?Patient Details ?Name: Jody Nguyen ?MRN: [id number] ?DOB: 1956-04-11 ? ? ?Cancelled treatment:       Reason Eval/Treat Not Completed: Fatigue/lethargy limiting ability to participate. Will continue efforts. ? ? ?Dakotah Heiman L. Jessie Cowher, MA CCC/SLP ?Acute Rehabilitation Services ?Office number [phone] ?Pager [phone] ? ? ? ?[name] ?02/01/2022, 11:19 AM ?

## 2022-02-01 NOTE — Progress Notes (Signed)
Inpatient Rehab Admissions Coordinator:  ? ?At this time we are recommending a CIR consult and I will place an order per our protocol.  ? ?Shann Medal, PT, DPT ?Admissions Coordinator ?[phone] ?02/01/22  ?2:50 PM ? ?

## 2022-02-02 ENCOUNTER — Inpatient Hospital Stay (HOSPITAL_COMMUNITY): Payer: Medicare Other

## 2022-02-02 ENCOUNTER — Ambulatory Visit: Payer: Medicare Other | Admitting: Nurse Practitioner

## 2022-02-02 DIAGNOSIS — R570 Cardiogenic shock: Secondary | ICD-10-CM | POA: Diagnosis not present

## 2022-02-02 DIAGNOSIS — J9601 Acute respiratory failure with hypoxia: Secondary | ICD-10-CM | POA: Diagnosis not present

## 2022-02-02 HISTORY — PX: IR US GUIDE VASC ACCESS LEFT: IMG2389

## 2022-02-02 HISTORY — PX: IR FLUORO GUIDE CV LINE LEFT: IMG2282

## 2022-02-02 LAB — RENAL FUNCTION PANEL
Albumin: 2.3 g/dL — ABNORMAL LOW (ref 3.5–5.0)
Anion gap: 11 (ref 5–15)
BUN: 25 mg/dL — ABNORMAL HIGH (ref 8–23)
CO2: 24 mmol/L (ref 22–32)
Calcium: 10.1 mg/dL (ref 8.9–10.3)
Chloride: 100 mmol/L (ref 98–111)
Creatinine, Ser: 2.41 mg/dL — ABNORMAL HIGH (ref 0.44–1.00)
GFR, Estimated: 22 mL/min — ABNORMAL LOW (ref >60)
Glucose, Bld: 153 mg/dL — ABNORMAL HIGH (ref 70–99)
Phosphorus: 2.8 mg/dL (ref 2.5–4.6)
Potassium: 4.1 mmol/L (ref 3.5–5.1)
Sodium: 135 mmol/L (ref 135–145)

## 2022-02-02 LAB — CBC WITH DIFFERENTIAL/PLATELET
Abs Immature Granulocytes: 0.12 10*3/uL — ABNORMAL HIGH (ref 0.00–0.07)
Basophils Absolute: 0.2 10*3/uL — ABNORMAL HIGH (ref 0.0–0.1)
Basophils Relative: 1 %
Eosinophils Absolute: 2 10*3/uL — ABNORMAL HIGH (ref 0.0–0.5)
Eosinophils Relative: 14 %
HCT: 27 % — ABNORMAL LOW (ref 36.0–46.0)
Hemoglobin: 8.2 g/dL — ABNORMAL LOW (ref 12.0–15.0)
Immature Granulocytes: 1 %
Lymphocytes Relative: 12 %
Lymphs Abs: 1.7 10*3/uL (ref 0.7–4.0)
MCH: 30.7 pg (ref 26.0–34.0)
MCHC: 30.4 g/dL (ref 30.0–36.0)
MCV: 101.1 fL — ABNORMAL HIGH (ref 80.0–100.0)
Monocytes Absolute: 1.3 10*3/uL — ABNORMAL HIGH (ref 0.1–1.0)
Monocytes Relative: 9 %
Neutro Abs: 9.3 10*3/uL — ABNORMAL HIGH (ref 1.7–7.7)
Neutrophils Relative %: 63 %
Platelets: 210 10*3/uL (ref 150–400)
RBC: 2.67 MIL/uL — ABNORMAL LOW (ref 3.87–5.11)
RDW: 23.5 % — ABNORMAL HIGH (ref 11.5–15.5)
Smear Review: ADEQUATE
WBC: 14.6 10*3/uL — ABNORMAL HIGH (ref 4.0–10.5)
nRBC: 4.4 % — ABNORMAL HIGH (ref 0.0–0.2)

## 2022-02-02 LAB — GLUCOSE, CAPILLARY
Glucose-Capillary: <10 mg/dL — CL (ref 70–99)
Glucose-Capillary: 160 mg/dL — ABNORMAL HIGH (ref 70–99)
Glucose-Capillary: 69 mg/dL — ABNORMAL LOW (ref 70–99)
Glucose-Capillary: 100 mg/dL — ABNORMAL HIGH (ref 70–99)
Glucose-Capillary: 119 mg/dL — ABNORMAL HIGH (ref 70–99)
Glucose-Capillary: 129 mg/dL — ABNORMAL HIGH (ref 70–99)
Glucose-Capillary: 140 mg/dL — ABNORMAL HIGH (ref 70–99)

## 2022-02-02 LAB — VITAMIN C: Vitamin C: 0.6 mg/dL (ref 0.4–2.0)

## 2022-02-02 LAB — HEPARIN LEVEL (UNFRACTIONATED): Heparin Unfractionated: 0.47 IU/mL (ref 0.30–0.70)

## 2022-02-02 MED ORDER — LIDOCAINE-EPINEPHRINE 1 %-1:100000 IJ SOLN
INTRAMUSCULAR | Status: AC
  Filled 2022-02-02: qty 1

## 2022-02-02 MED ORDER — GELATIN ABSORBABLE 12-7 MM EX MISC
CUTANEOUS | Status: AC
  Filled 2022-02-02: qty 1

## 2022-02-02 MED ORDER — APIXABAN 5 MG PO TABS
5.0000 mg | ORAL_TABLET | Freq: Two times a day (BID) | ORAL | Status: DC
  Administered 2022-02-02 – 2022-02-03 (×3): 5 mg via ORAL
  Filled 2022-02-02 (×3): qty 1

## 2022-02-02 MED ORDER — DEXTROSE 50 % IV SOLN
12.5000 g | Freq: Once | INTRAVENOUS | Status: AC
  Administered 2022-02-02: 09:00:00 12.5 g via INTRAVENOUS

## 2022-02-02 MED ORDER — MIDAZOLAM HCL 2 MG/2ML IJ SOLN
INTRAMUSCULAR | Status: AC
  Filled 2022-02-02: qty 2

## 2022-02-02 MED ORDER — DEXTROSE 50 % IV SOLN
INTRAVENOUS | Status: AC
  Administered 2022-02-02: 09:00:00 12.5 g via INTRAVENOUS
  Filled 2022-02-02: qty 50

## 2022-02-02 MED ORDER — CHLORHEXIDINE GLUCONATE 0.12 % MT SOLN
OROMUCOSAL | Status: DC
Start: 2022-02-02 — End: 2022-02-02
  Filled 2022-02-02: qty 15

## 2022-02-02 MED ORDER — FENTANYL CITRATE (PF) 100 MCG/2ML IJ SOLN
INTRAMUSCULAR | Status: AC
  Filled 2022-02-02: qty 2

## 2022-02-02 MED ORDER — FENTANYL CITRATE (PF) 100 MCG/2ML IJ SOLN
INTRAMUSCULAR | Status: AC | PRN
  Administered 2022-02-02: 25 ug via INTRAVENOUS

## 2022-02-02 MED ORDER — MIDAZOLAM HCL 2 MG/2ML IJ SOLN
INTRAMUSCULAR | Status: AC | PRN
  Administered 2022-02-02: .5 mg via INTRAVENOUS

## 2022-02-02 MED ORDER — HEPARIN SODIUM (PORCINE) 1000 UNIT/ML IJ SOLN
INTRAMUSCULAR | Status: AC
  Filled 2022-02-02: qty 10

## 2022-02-02 NOTE — Progress Notes (Addendum)
Physical Therapy Treatment Patient Details Name: Jody Nguyen MRN: [id number] DOB: [birth year] Today's Date: 02/02/2022   History of Present Illness 66 yo female presenting 2/5 with recurrent chest pain. Pt now s/p CABG x3 and tricuspid valve annuloplasty on 2/10 requiring 6 units PRBCs, 2 units FFP, and 2 bags of platelets and resulting in vasoplegic shock with very high vasopressor requirements. Started on CRRT 2/11, extubated 2/16. S/p right heart cath 3/1 and Swan-ganz placed. Off CRRT 3/7. S/p L IJ HD catheter placement 3/9. PMH includes: anemia, CHF, CKD IV on HD MWF, CVA, DM II, HLD, HTN, pulmonary HTN.    PT Comments    Pt now with L IJ HD catheter and no further femoral access lines. Treated pt in conjunction with OT to maximize pt safety and secondary to pt's extent of physical and cognitive deficits. Of note, OT reported she noticed pt had a R gaze preference on Tuesday but was unable to assess pt accurately for unilateral symptoms secondary to significant weakness and cognitive deficits that date. Pt with improved strength and ability to participate today and thus was able to note other unilateral symptoms, like a L lateral lean with pt pushing self to L with painful, swollen R UE. Pt also with L leg consistently weak with LAQs and R leg displaying x1 strong kick. Pt continues to display a R gaze preference with ability to track across midline slightly momentarily to her L and then eyes jumping back to midline or the R with inability to maintain gaze to L. Notified RN and MD with MD reporting plans for head CT. Pt needing maxAx2 for all transfers this date due to significant weakness and difficulty sequencing. Will continue to follow acutely. Current recommendations remain appropriate.    Recommendations for follow up therapy are one component of a multi-disciplinary discharge planning process, led by the attending physician.  Recommendations may be updated based on patient status,  additional functional criteria and insurance authorization.  Follow Up Recommendations  Acute inpatient rehab (3hours/day)     Assistance Recommended at Discharge Frequent or constant Supervision/Assistance  Patient can return home with the following Two people to help with walking and/or transfers;Two people to help with bathing/dressing/bathroom;Assistance with cooking/housework;Help with stairs or ramp for entrance;Direct supervision/assist for medications management;Direct supervision/assist for financial management;Assist for transportation;Assistance with feeding   Equipment Recommendations  Other (comment) (TBA)    Recommendations for Other Services       Precautions / Restrictions Precautions Precautions: Fall;Sternal;Other (comment) Precaution Booklet Issued: No Precaution Comments: flexiseal, cortrack Restrictions Weight Bearing Restrictions: Yes Other Position/Activity Restrictions: sternal precautions     Mobility  Bed Mobility Overal bed mobility: Needs Assistance Bed Mobility: Supine to Sit     Supine to sit: Mod assist, +2 for physical assistance, +2 for safety/equipment, HOB elevated     General bed mobility comments: Several minutes and repeated cues to direct legs off L EOB, modAx2 to manage legs and trunk and scoot hips to EOB with use of bed pads.    Transfers Overall transfer level: Needs assistance Equipment used: 2 person hand held assist Transfers: Bed to chair/wheelchair/BSC, Sit to/from Stand Sit to Stand: Max assist, +2 physical assistance, +2 safety/equipment Stand pivot transfers: Max assist, +2 physical assistance, +2 safety/equipment         General transfer comment: Cues provided for pt to hold onto each therapist's arm with support provided at pt's buttocks to asisst in extending hips coming to stand 1x from elevated EOB and 2x  from recliner. Bil knee block provided, needing maxAx2 to come to stand then pivot to L.     Ambulation/Gait               General Gait Details: Unable to take steps at this time   Stairs             Wheelchair Mobility    Modified Rankin (Stroke Patients Only)       Balance Overall balance assessment: Needs assistance Sitting-balance support: Bilateral upper extremity supported, Feet supported, Single extremity supported, No upper extremity supported Sitting balance-Leahy Scale: Poor Sitting balance - Comments: Pt with L lateral lean and pushing with R UE to her L at EOB and in recliner, needing repeated cues to place hands in lap. Pt varying from min guard-minA to sit statically. Postural control: Left lateral lean Standing balance support: Bilateral upper extremity supported, During functional activity Standing balance-Leahy Scale: Zero Standing balance comment: MaxAx2, bil knee block, and bil UE support to stand 3x for up to ~5 - 10 sec each                            Cognition Arousal/Alertness: Awake/alert Behavior During Therapy: Flat affect, Restless Overall Cognitive Status: Impaired/Different from baseline Area of Impairment: Attention, Memory, Following commands, Safety/judgement, Problem solving, Awareness                   Current Attention Level: Sustained Memory: Decreased short-term memory, Decreased recall of precautions Following Commands: Follows one step commands inconsistently, Follows one step commands with increased time Safety/Judgement: Decreased awareness of safety, Decreased awareness of deficits Awareness: Intellectual Problem Solving: Slow processing, Decreased initiation, Difficulty sequencing, Requires verbal cues, Requires tactile cues General Comments: Pt with eyes open and flat affect throughout. Pt restless at times in regards to not comprehending why she was here and stating "you all are just making fun of me", even though repeatedly encouraged that PT/OT were not doing so but rather encouraging her  to mobilize to get stronger. Pt repeating "who here thinks I'm crazy?" appearing to have forgotten asking that a few minutes earlier. Needs extra time and multi-modal repeated cues to sequence tasks step-by-step.        Exercises General Exercises - Lower Extremity Long Arc Quad: AROM, Strengthening, Both, 5 reps, Seated    General Comments General comments (skin integrity, edema, etc.): SpO2 90s on RA. HR and BP stable. Notified RN of pt's inattention to L, decreased visual tracking to L, and pushing to L with RUE while seated.      Pertinent Vitals/Pain Pain Assessment Pain Assessment: Faces Faces Pain Scale: Hurts little more Pain Location: bil UEs Pain Descriptors / Indicators: Discomfort, Grimacing Pain Intervention(s): Limited activity within patient's tolerance, Monitored during session, Repositioned    Home Living                          Prior Function            PT Goals (current goals can now be found in the care plan section) Acute Rehab PT Goals Patient Stated Goal: did not state PT Goal Formulation: With patient/family Time For Goal Achievement: 02/09/22 Potential to Achieve Goals: Good Progress towards PT goals: Progressing toward goals    Frequency    Min 3X/week      PT Plan Frequency needs to be updated    Co-evaluation PT/OT/SLP Co-Evaluation/Treatment: Yes Reason  for Co-Treatment: To address functional/ADL transfers;For patient/therapist safety;Necessary to address cognition/behavior during functional activity PT goals addressed during session: Mobility/safety with mobility;Balance OT goals addressed during session: ADL's and self-care      AM-PAC PT "6 Clicks" Mobility   Outcome Measure  Help needed turning from your back to your side while in a flat bed without using bedrails?: Total Help needed moving from lying on your back to sitting on the side of a flat bed without using bedrails?: Total Help needed moving to and from a  bed to a chair (including a wheelchair)?: Total Help needed standing up from a chair using your arms (e.g., wheelchair or bedside chair)?: Total Help needed to walk in hospital room?: Total Help needed climbing 3-5 steps with a railing? : Total 6 Click Score: 6    End of Session Equipment Utilized During Treatment: Gait belt Activity Tolerance: Patient tolerated treatment well;Patient limited by fatigue Patient left: in chair;with call bell/phone within reach Nurse Communication: Mobility status;Other (comment) (signs noted during session) PT Visit Diagnosis: Unsteadiness on feet (R26.81);Other abnormalities of gait and mobility (R26.89);Muscle weakness (generalized) (M62.81);Difficulty in walking, not elsewhere classified (R26.2)     Time: 7494-4967 PT Time Calculation (min) (ACUTE ONLY): 35 min  Charges:  $Therapeutic Activity: 8-22 mins                     Moishe Spice, PT, DPT Acute Rehabilitation Services  Pager: [phone] Office: [place] 02/02/2022, 6:21 PM

## 2022-02-02 NOTE — Progress Notes (Signed)
?[facility] ?Progress Note  ? ?Assessment/ Plan:   ?OP HD: MWF [facility] ?3:15h   450/A1.5   75.5kg   2K/2Ca bath  P2   AVF  Hep  2500 ? - COVID neg 2/04, hep B SAg neg on 01/03/22 ?- Hectoral 57mcg IV q HD ?- Sensipar $RemoveBef'90mg'ypqslDxpYE$  PO q HD ?- Mircera 129mcg IV q 2 weeks (ordered, not yet given. Last Mircera 100 on 12/07/21) ?  ?  ?  ?Assessment/Plan:  ? CAD s/p CABG x3, TR sp TVR:  per CT surgery  ? ESRD:  normally MWF schedule. SP CRRT 2/07- 3/07. Had iHD yesterday. Plan next HD tomorrow.   ?Shock - cardiogenic and septic w/ RV failure. RHC 02/13/2022-->  well-compensated hemodynamics. Continues on midodrine tid. Off all pressors and inotropes.   ? Volume - no need to push vol down now per cardiology. Keep even on HD.  ?Morganella bacteremia -  s/p Zosyn ? Anemia CKD: Sq darbe 100ug weekly on Saturday. 1st 2/25. ? Metabolic bone disease: resume home binders and sensipar when taking PO. Cont IV vdra.  ? Infiltrated AVF - AVF infiltrated on 2/7. Post-op AVF is nonfunctional, VVS aware. Planning for [facility] when more stable.  ?Pulm HTN:  Followed at [facility], holding macitenan (opsumit) ? T2DM-per primary service ? Hx of MV replacement - in 2016 ?Dispo: in ICU ? ?Kelly Splinter, MD ?02/02/2022, 2:12 PM ? ? ? ? ?Subjective:   ? ?Seen in room, no c/o  ? ?Objective:   ?BP (!) 107/54 Comment: art line  Pulse 77   Temp (!) 97.1 ?F (36.2 ?C) (Axillary)   Resp 15   Ht 4' 11.5" (1.511 m)   Wt 69.8 kg   SpO2 100%   BMI 30.56 kg/m?  ? ?Physical Exam: ?JME:QASTM, cortrak in place, eyes open, nad ?CVS: reg RR ?Resp: coarse ?Abd: soft obese nontender ?Ext: 1+ RUE edema, no other edema ?ACCESS: nontunneled HD cath R fem ? ?Labs: ?BMET ?Recent Labs  ?Lab 01/29/22 ?0345 01/29/22 ?1518 01/30/22 ?0345 01/30/22 ?1750 01/30/22 ?2208 01/31/22 ?1962 02/01/22 ?2297 02/02/22 ?0353  ?NA 135 135 135 136 137 137 135 135  ?K 4.7 4.2 4.2 4.0 4.3 4.1 4.5 4.1  ?CL 102 102 104 104  --  104 102 100  ?CO2 $Rem'22 23 22 25  'jwHd$ --  $R'24 23 24  'kW$ ?GLUCOSE 240* 148* 131* 95   --  155* 151* 153*  ?BUN $Rem'11 11 11 11  'gKXJ$ --  12 25* 25*  ?CREATININE 1.12* 0.97 0.87 0.95  --  1.11* 2.13* 2.41*  ?CALCIUM 9.7 9.2 9.4 9.5  --  9.4 9.7 10.1  ?PHOS 1.7* 3.8 2.3* 2.0*  --  1.8* 2.9 2.8  ? ? ?CBC ?Recent Labs  ?Lab 01/30/22 ?0345 01/30/22 ?2208 01/31/22 ?9892 02/01/22 ?1194 02/02/22 ?0353  ?WBC 19.8*  --  15.8* 15.6* 14.6*  ?NEUTROABS 14.4*  --  10.4* 9.9* 9.3*  ?HGB 8.2* 9.5* 8.0* 7.9* 8.2*  ?HCT 26.7* 28.0* 25.9* 26.4* 27.0*  ?MCV 101.5*  --  101.2* 102.3* 101.1*  ?PLT 160  --  185 199 210  ? ? ? ?  ?Medications:   ? ? amiodarone  200 mg Oral BID  ? aspirin EC  325 mg Oral Daily  ? Or  ? aspirin  324 mg Per Tube Daily  ? chlorhexidine gluconate (MEDLINE KIT)  15 mL Mouth Rinse BID  ? Chlorhexidine Gluconate Cloth  6 each Topical Daily  ? darbepoetin (ARANESP) injection - NON-DIALYSIS  100 mcg  Subcutaneous Q Sat-1800  ? doxercalciferol  9 mcg Intravenous Q M,W,F-HD  ? ezetimibe  10 mg Oral Daily  ? feeding supplement (PROSource TF)  45 mL Per Tube BID  ? feeding supplement (VITAL 1.5 CAL)  600 mL Per Tube Q24H  ? gelatin adsorbable      ? heparin sodium (porcine)      ? insulin aspart  0-20 Units Subcutaneous Q4H  ? insulin detemir  12 Units Subcutaneous BID  ? latanoprost  1 drop Both Eyes QHS  ? lidocaine-EPINEPHrine      ? midodrine  15 mg Oral Q6H  ? multivitamin  1 tablet Per Tube QHS  ? pantoprazole sodium  40 mg Per Tube Daily  ? rosuvastatin  10 mg Per Tube QHS  ? sodium chloride flush  10-40 mL Intracatheter Q12H  ? sodium chloride flush  3 mL Intravenous Q12H  ? vancomycin variable dose per unstable renal function (pharmacist dosing)   Does not apply See admin instructions  ? ? ? ? ?

## 2022-02-02 NOTE — Progress Notes (Signed)
Speech Language Pathology Treatment: Dysphagia  ?Patient Details ?Name: Jody Nguyen ?MRN: [id number] ?DOB: 1956-07-19 ?Today's Date: 02/02/2022 ?Time: 1038-1100 ?SLP Time Calculation (min) (ACUTE ONLY): 22 min ? ?Assessment / Plan / Recommendation ?Clinical Impression ? Pt alert, little confused, delayed responses and agreeable to nectar thick, graham cracker and teaspoon trial of thin juice. With mitt removed she fed herself sips from cup of nectar unremarkably; mastication optimal with solid and SLP administered thin juice. There was one minimal throat clear throughout not able to correlate with one particular consistency. Prior to upgrade to thin recommend a repeat MBS is recommended. Anda Kraft, [place] arrived and discussed pt's intake. She has recently been NPO for procedures or not alert enough which is affecting intake. Continue Dys 3/nectar and recommend repeat MBS tomorrow for potential upgrade.   ?  ?HPI HPI: 66 yo F presented to [facility] ED 2/4 with chest pain. ECHO revealed severe RV failure, felt to be  consistent with chronic process such as severe pulmonary HTN. 2/7 cardiac cath - three vessel dz;  2/10 CABGx3, TVR; 2/13 chest tube removed; ETT 2/10-2/16; CVVHD.  PMH ESRD on HD, non-obstructive CAD, suspected pulmonary hypertension, Hx PE on eliquis. Passed [place] on 2/16; RN had concerns for potential dysphagia and requested SLP swallow consult. ?  ?   ?SLP Plan ? Continue with current plan of care (consider repeat MBS soon) ? ?  ?  ?Recommendations for follow up therapy are one component of a multi-disciplinary discharge planning process, led by the attending physician.  Recommendations may be updated based on patient status, additional functional criteria and insurance authorization. ?  ? ?Recommendations  ?Diet recommendations: Dysphagia 3 (mechanical soft);Nectar-thick liquid ?Liquids provided via: Cup;Straw ?Medication Administration: Whole meds with puree ?Supervision: Staff to assist with self  feeding ?Compensations: Slow rate;Small sips/bites;Minimize environmental distractions ?Postural Changes and/or Swallow Maneuvers: Seated upright 90 degrees  ?   ?    ?   ? ? ? ? Oral Care Recommendations: Oral care BID ?Follow Up Recommendations: Acute inpatient rehab (3hours/day) ?Assistance recommended at discharge: Frequent or constant Supervision/Assistance ?SLP Visit Diagnosis: Dysphagia, pharyngeal phase (R13.13) ?Plan: Continue with current plan of care (consider repeat MBS soon) ? ? ? ? ?  ?  ? ? ?Houston Siren ? ?02/02/2022, 11:11 AM ?

## 2022-02-02 NOTE — Progress Notes (Signed)
ANTICOAGULATION CONSULT NOTE ? ?Pharmacy Consult for bivalirudin>>heparin ?Indication: history of pulmonary embolus 2021 ? ?Allergies  ?Allergen Reactions  ? Minoxidil Other (See Comments)  ?  Pericardial effusion  ? Lipitor [Atorvastatin] Other (See Comments)  ?  MYALGIAS > "pain in legs" ?Tolerates rosuvastatin  ? Morphine And Related Itching  ? Ace Inhibitors Other (See Comments)  ?  REACTION: "not sure...think it made me drowsy all the time"  ? ? ?Patient Measurements: ?Height: 4' 11.5" (151.1 cm) ?Weight: 69.8 kg (153 lb 14.1 oz) ?IBW/kg (Calculated) : 44.35 ?Heparin DW: 70.2kg  ? ?Vital Signs: ?Temp: 98.2 ?F (36.8 ?C) (03/09 0300) ?Pulse Rate: 81 (03/09 0500) ? ?Labs: ?Recent Labs  ?  01/31/22 ?0439 02/01/22 ?3704 02/01/22 ?1322 02/02/22 ?0353  ?HGB 8.0* 7.9*  --  8.2*  ?HCT 25.9* 26.4*  --  27.0*  ?PLT 185 199  --  210  ?HEPARINUNFRC 0.41 0.51 0.46 0.47  ?CREATININE 1.11* 2.13*  --  2.41*  ? ? ? ?Estimated Creatinine Clearance: 19.8 mL/min (A) (by C-G formula based on SCr of 2.41 mg/dL (H)). ? ? ?Assessment: ?66 yo female on chronic apixaban for hx of PE 2021. Apixaban has been held for surgery.  Pharmacy asked to resume anticoagulation post op 2/15 with bivalirudin.  HIT antibody and SRA both negative.  Platelet count now recovered 50>208>150-160>210.  ? ?Patient on heparin (s/p bivalirudin) as HIT work-up negative. Heparin level therapeutic at 0.47 on 1150 units/hr, H/H stable, pltc continues to improve.  ? ?Patient to get tunneled HD cath today.  ? ?Goal of Therapy:  ?Heparin level 0.3-0.5 - target lower end given low platelet count/risk of post-operative bleeding ?Monitor platelets by anticoagulation protocol: Yes ?  ?Plan:  ?-Continue heparin 1150 units/h ?-Daily heparin level and CBC ?-Follow-up switch to apixiban after HD cath placed ? ?Cathrine Muster, PharmD ?PGY2 Cardiology Pharmacy Resident ?Phone: [phone] ?02/02/2022  5:59 AM ? ?Please check [email or web address] for unit-specific pharmacy phone  numbers. ? ? ?

## 2022-02-02 NOTE — PMR Pre-admission (Shared)
PMR Admission Coordinator Pre-Admission Assessment  Patient: Jody Nguyen is an 66 y.o., female MRN: [id number] DOB: [birth year] Height: 4' 11.5" (151.1 cm) Weight: 69.8 kg  Insurance Information HMO: Yes    PPO:       PCP:       IPA:       80/20:       OTHER:  Group [id number]  PRIMARY: U[employer] Medicare      Policy#: [id number]      Subscriber: patient CM Name: ***      Phone#: ***     Fax#: [phone] Pre-Cert#: ***      Employer: Retired Benefits:  Phone #: [phone]     Name: [employer] on line Eff. Date: 11/27/21     Deduct: $0      Out of Pocket Max: $4500 (met $232.09)      Life Max: N/A CIR: $325 days 1-6      SNF: $0 days 1-20; $196 days 21-43; $0 days 44-100 Outpatient: med nec     Co-Pay: $20/visit Home Health: 100%      Co-Pay: none DME: 80%     Co-Pay: 20% Providers: in network  SECONDARY: [employer]      Policy#: [id number]     Phone#: [phone]  Financial Counselor:       Phone#:   The Data Collection Information Summary for patients in Inpatient Rehabilitation Facilities with attached Privacy Act Statement-Health Care Records was provided and verbally reviewed with: Patient and Family  Emergency Contact Information Contact Information     Name Relation Home Work Mobile   Crandle,Charles Spouse [phone]  [phone]       Current Medical History  Patient Admitting Diagnosis: Debility  History of Present Illness: A 66 yo female presenting 2/5 with recurrent chest pain. Pt now s/p CABG x3 and tricuspid valve annuloplasty on 2/10 requiring 6 units PRBCs, 2 units FFP, and 2 bags of platelets and resulting in vasoplegic shock with very high vasopressor requirements. Started on CRRT 2/11, extubated 2/16. S/p right heart cath 3/1 and Swan-ganz placed. Off CRRT 3/7. On intermittent HD.  OP hd will be MWF at [place].  PT/OT/SLP evaluations completed with recommendations for post acute rehab admission.  Patient's medical record from [facility] health  has been  reviewed by the rehabilitation admission coordinator and physician.  Past Medical History  Past Medical History:  Diagnosis Date   Anemia    Chronic diastolic CHF (congestive heart failure) (HCC)    Chronic diastolic heart failure (HCC)    CKD (chronic kidney disease) stage 4, GFR 15-29 ml/min (HCC)    dialysis M/W/F   Constipation    CVA (cerebral infarction) 1997   no residual deficit   Diverticulitis    DM (diabetes mellitus) (HCC)    type 2   Heart murmur    History of cardiovascular stress test    a. Myoview Oct 2012 showed EF 49%, no ischemia, LVE   HLD (hyperlipidemia)    takes Crestor daily   Hypertension    Pericardial effusion    chronic; felt to be poss related to minoxidil >> DC'd   Pulmonary hypertension (HCC) 06/18/2015   Respiratory failure, acute hypoxic, post-operative 07/08/2015   Requiring ECMO support   Respiratory failure, post-operative (HCC)    S/P cardiac catheterization    a. R/L HC 06/18/15:  mLAD 30%; severe pulmo HTN with PA sat 43%, CI 1.86, prominent V waves indicative of MR; resting hypoxemia O2 sat 86% on RA  S/P minimally invasive mitral valve repair 07/08/2015   Complex valvuloplasty including artificial Gore-tex neochord placement x10 and 26 mm Sorin Memo 3D Rechord ring annuloplasty via right mini thoracotomy approach   S/P minimally invasive mitral valve repair 07/08/2015   Complex valvuloplasty including artificial Gore-tex neochord placement x10 and 26 mm Sorin Memo 3D Rechord ring annuloplasty via right mini thoracotomy approach   Severe mitral regurgitation    Shortness of breath dyspnea    Stroke ([place]) 1997   no residual effect   Vitamin D deficiency    Wears glasses     Has the patient had major surgery during 100 days prior to admission? Yes  Family History   family history includes Breast cancer in her sister; Cancer in her brother and sister; Heart attack in her brother; Heart disease in her brother; Stroke in her  mother.  Current Medications  Current Facility-Administered Medications:    0.9 %  sodium chloride infusion (Manually program via Guardrails IV Fluids), , Intravenous, Continuous, Bensimhon, Shaune Pascal, MD   0.9 %  sodium chloride infusion (Manually program via Guardrails IV Fluids), , Intravenous, PRN, Bensimhon, Shaune Pascal, MD   0.9 %  sodium chloride infusion, , Intravenous, PRN, Bensimhon, Shaune Pascal, MD, Stopped at 02/01/22 2059   0.9 %  sodium chloride infusion, , Intravenous, Continuous, Bensimhon, Shaune Pascal, MD, Last Rate: 10 mL/hr at 01/31/22 2057, New Bag at 01/31/22 2057   0.9 %  sodium chloride infusion, , Intravenous, Continuous, Bensimhon, Shaune Pascal, MD, Stopped at 01/31/22 2057   amiodarone (PACERONE) tablet 200 mg, 200 mg, Oral, BID, [name], MD, 200 mg at 02/02/22 0930   apixaban (ELIQUIS) tablet 5 mg, 5 mg, Oral, BID, Bensimhon, Shaune Pascal, MD   aspirin EC tablet 325 mg, 325 mg, Oral, Daily **OR** aspirin chewable tablet 324 mg, 324 mg, Per Tube, Daily, Bensimhon, Shaune Pascal, MD, 324 mg at 02/02/22 0930   bisacodyl (DULCOLAX) EC tablet 10 mg, 10 mg, Oral, Daily PRN **OR** bisacodyl (DULCOLAX) suppository 10 mg, 10 mg, Rectal, Daily PRN, Candee Furbish, MD   ceFEPIme (MAXIPIME) 1 g in sodium chloride 0.9 % 100 mL IVPB, 1 g, Intravenous, Q24H, Einar Grad, RPH, Stopped at 02/01/22 2129   chlorhexidine gluconate (MEDLINE KIT) (PERIDEX) 0.12 % solution 15 mL, 15 mL, Mouth Rinse, BID, Bensimhon, Shaune Pascal, MD, 15 mL at 02/02/22 5956   Chlorhexidine Gluconate Cloth 2 % PADS 6 each, 6 each, Topical, Daily, Bensimhon, Shaune Pascal, MD, 6 each at 02/01/22 1327   Darbepoetin Alfa (ARANESP) injection 100 mcg, 100 mcg, Subcutaneous, Q Sat-1800, Bensimhon, Shaune Pascal, MD, 100 mcg at 01/28/22 1744   docusate (COLACE) 50 MG/5ML liquid 200 mg, 200 mg, Per Tube, Daily PRN, Candee Furbish, MD   doxercalciferol (HECTOROL) injection 9 mcg, 9 mcg, Intravenous, Q M,W,F-HD, Bensimhon, Shaune Pascal, MD,  9 mcg at 02/01/22 0948   ezetimibe (ZETIA) tablet 10 mg, 10 mg, Oral, Daily, Lyda Jester M, PA-C, 10 mg at 02/02/22 0930   feeding supplement (PROSource TF) liquid 45 mL, 45 mL, Per Tube, BID, Bensimhon, Shaune Pascal, MD, 45 mL at 02/02/22 0931   feeding supplement (VITAL 1.5 CAL) liquid 600 mL, 600 mL, Per Tube, Q24H, Candee Furbish, MD, Stopped at 02/02/22 0004   food thickener (SIMPLYTHICK (HONEY/LEVEL 3/MODERATELY THICK)) 10 packet, 10 packet, Oral, PRN, Bensimhon, Shaune Pascal, MD, 1 packet at 01/28/22 0444   gelatin adsorbable (GELFOAM/SURGIFOAM) 12-7 MM sponge 12-7 mm, , , ,  Gerhardt's butt cream, , Topical, PRN, Julian Hy, DO, Given at 02/02/22 0404   heparin sodium (porcine) 1000 UNIT/ML injection, , , ,    hydrOXYzine (ATARAX) 10 MG/5ML syrup 10 mg, 10 mg, Per Tube, Q6H PRN, Bensimhon, Shaune Pascal, MD, 10 mg at 02/01/22 2126   insulin aspart (novoLOG) injection 0-20 Units, 0-20 Units, Subcutaneous, Q4H, Bensimhon, Shaune Pascal, MD, 4 Units at 02/02/22 0353   insulin detemir (LEVEMIR) injection 12 Units, 12 Units, Subcutaneous, BID, Candee Furbish, MD, 12 Units at 02/01/22 2212   latanoprost (XALATAN) 0.005 % ophthalmic solution 1 drop, 1 drop, Both Eyes, QHS, Bensimhon, Shaune Pascal, MD, 1 drop at 02/01/22 2127   lidocaine-EPINEPHrine (XYLOCAINE W/EPI) 1 %-1:100000 (with pres) injection, , , ,    lip balm (CARMEX) ointment, , Topical, PRN, Candee Furbish, MD, 75 application. at 01/29/22 1949   midodrine (PROAMATINE) tablet 15 mg, 15 mg, Oral, Q6H, Candee Furbish, MD, 15 mg at 02/02/22 1516   multivitamin (RENA-VIT) tablet 1 tablet, 1 tablet, Per Tube, QHS, Bensimhon, Shaune Pascal, MD, 1 tablet at 02/01/22 2127   ondansetron [facility]) injection 4 mg, 4 mg, Intravenous, Q6H PRN, Bensimhon, Shaune Pascal, MD, 4 mg at 01/31/22 0007   oxyCODONE (Oxy IR/ROXICODONE) immediate release tablet 5-10 mg, 5-10 mg, Per Tube, Q3H PRN, Bensimhon, Shaune Pascal, MD, 10 mg at 02/02/22 0344   pantoprazole sodium  (PROTONIX) 40 mg/20 mL oral suspension 40 mg, 40 mg, Per Tube, Daily, Bensimhon, Shaune Pascal, MD, 40 mg at 02/02/22 0932   rosuvastatin (CRESTOR) tablet 10 mg, 10 mg, Per Tube, QHS, Julian Hy, DO, 10 mg at 02/01/22 2127   sodium chloride flush (NS) 0.9 % injection 10-40 mL, 10-40 mL, Intracatheter, Q12H, Bensimhon, Shaune Pascal, MD, 10 mL at 02/02/22 0932   sodium chloride flush (NS) 0.9 % injection 10-40 mL, 10-40 mL, Intracatheter, PRN, Bensimhon, Shaune Pascal, MD   sodium chloride flush (NS) 0.9 % injection 3 mL, 3 mL, Intravenous, Q12H, Bensimhon, Shaune Pascal, MD, 3 mL at 02/02/22 0932   sodium chloride flush (NS) 0.9 % injection 3 mL, 3 mL, Intravenous, PRN, Bensimhon, Shaune Pascal, MD   traMADol Veatrice Bourbon) tablet 50-100 mg, 50-100 mg, Per Tube, Q12H PRN, Bensimhon, Shaune Pascal, MD, 100 mg at 01/31/22 0102   vancomycin variable dose per unstable renal function (pharmacist dosing), , Does not apply, See admin instructions, Einar Grad, RPH  Patients Current Diet:  Diet Order             Diet NPO time specified Except for: Sips with Meds  Diet effective midnight                   Precautions / Restrictions Precautions Precautions: Fall, Sternal, Other (comment) Precaution Booklet Issued: No Precaution Comments: flexiseal, cortrack, Swan-ganz femoral, s/p R heart cath 3/1 Restrictions Weight Bearing Restrictions: No Other Position/Activity Restrictions: sternal precautions   Has the patient had 2 or more falls or a fall with injury in the past year? Yes  Prior Activity Level Community (5-7x/wk): Went out 4-5 days a week.  Goes to HD MWF, to hair salon on Thursdays.  Prior Functional Level Self Care: Did the patient need help bathing, dressing, using the toilet or eating? Independent  Indoor Mobility: Did the patient need assistance with walking from room to room (with or without device)? Independent  Stairs: Did the patient need assistance with internal or external stairs (with or  without device)? Independent  Functional Cognition: Did the  patient need help planning regular tasks such as shopping or remembering to take medications? Independent  Patient Information Are you of Hispanic, Latino/a,or Spanish origin?: A. No, not of Hispanic, Latino/a, or Spanish origin What is your race?: B. Black or African American Do you need or want an interpreter to communicate with a doctor or health care staff?: 0. No  Patient's Response To:  Health Literacy and Transportation Is the patient able to respond to health literacy and transportation needs?: Yes Health Literacy - How often do you need to have someone help you when you read instructions, pamphlets, or other written material from your doctor or pharmacy?: Never In the past 12 months, has lack of transportation kept you from medical appointments or from getting medications?: No In the past 12 months, has lack of transportation kept you from meetings, work, or from getting things needed for daily living?: No  Home Assistive Devices / Oasis Devices/Equipment: Blood pressure cuff, CBG Meter, Walker (specify type) Home Equipment: None  Prior Device Use: Indicate devices/aids used by the patient prior to current illness, exacerbation or injury? None of the above  Current Functional Level Cognition  Overall Cognitive Status: Difficult to assess Difficult to assess due to: Level of arousal Current Attention Level: Sustained Orientation Level: Oriented to person, Disoriented to place, Disoriented to time, Disoriented to situation Following Commands: Follows one step commands consistently Safety/Judgement: Decreased awareness of deficits General Comments: Patient more lethargic; RN report this has been her state today. Pt requiring singificant time to respond to yes/no questions and follow commands. Following ~10% of comments.    Extremity Assessment (includes Sensation/Coordination)  Upper Extremity  Assessment: RUE deficits/detail, LUE deficits/detail RUE Deficits / Details: edematous RUE; R hand in dependent position; arm elevated with hand higher than elbow; educated husband on ROM; able to lift hand off bed and slide on tummy; uanble to actively reach mouth, but able to reach mouth with AAROM RUE Sensation: WNL RUE Coordination: decreased fine motor, decreased gross motor LUE Deficits / Details: Generalized weakness LUE Coordination: decreased fine motor, decreased gross motor  Lower Extremity Assessment: Defer to PT evaluation RLE Deficits / Details: pt able to complete small ROM at ankle, knee, and hip but limited in hip flexion due to fem lines. RLE Sensation: WNL RLE Coordination: decreased gross motor LLE Deficits / Details: pt able to complete small ROM at ankle, knee, and hip but limited in hip flexion due to fem lines. LLE Sensation: WNL LLE Coordination: decreased gross motor    ADLs  Overall ADL's : Needs assistance/impaired Eating/Feeding: Cueing for sequencing, Maximal assistance, Sitting Eating/Feeding Details (indicate cue type and reason): Min hand over hand for bringing spoon to mouth. Requiring Max A for scooping ice cream and intating task. Grooming: Wash/dry face, Min guard, Set up, Bed level Grooming Details (indicate cue type and reason): Pt washing her face while in semi standing at 45* in kreg bed tilting position. Able to bring LUE to face. Fatigues quickly Toilet Transfer: Maximal assistance, +2 for safety/equipment, +2 for physical assistance, Stand-pivot Toilet Transfer Details (indicate cue type and reason): Max A +2 for stand pivot to recliner with use of bed pad to bring hips forward and then over. Functional mobility during ADLs: Maximal assistance, +2 for physical assistance General ADL Comments: Max A +2 for stand pivot to recliner with use of bed pad to bring hips forward and then over. Once in recliner, pt requiring Max A for engaging in self feeding  task  Mobility  Overal bed mobility: Needs Assistance Bed Mobility: Sidelying to Sit, Rolling Rolling: Mod assist, +2 for physical assistance Sidelying to sit: Max assist, +2 for physical assistance General bed mobility comments: Mod A for rolling and then Max A +2 for bringing BLEs over EOB and elevating trunk    Transfers  Overall transfer level: Needs assistance Equipment used: 2 person hand held assist Transfers: Bed to chair/wheelchair/BSC Bed to/from chair/wheelchair/BSC transfer type:: Stand pivot Stand pivot transfers: Max assist, +2 physical assistance General transfer comment: Max A +2 for stand pivot to recliner with use of bed pad to bring hips forward and then over. Use of drop arm recliner    Ambulation / Gait / Stairs / Wheelchair Mobility  Ambulation/Gait General Gait Details: Focused session on tilting secondary to femoral lines limiting hip ROM and contraindicating sitting EOB    Posture / Balance Dynamic Sitting Balance Sitting balance - Comments: Requiring atleast Min A for sitting balance Balance Overall balance assessment: Needs assistance Sitting-balance support: Bilateral upper extremity supported, Feet supported Sitting balance-Leahy Scale: Poor Sitting balance - Comments: Requiring atleast Min A for sitting balance Postural control: Posterior lean Standing balance support: Bilateral upper extremity supported, During functional activity Standing balance-Leahy Scale: Zero Standing balance comment: Max A for standing    Special needs/care consideration Continuous Drip IV  Yes, Dialysis: Hemodialysis Monday, Wednesday, and Friday, Skin ***, Diabetic management Yes H/o DM, and Behavioral consideration Is confused.   Previous Home Environment (from acute therapy documentation) Living Arrangements: Spouse/significant other Available Help at Discharge: Family, Available 24 hours/day Type of Home: House Home Layout: One level Home Access: Stairs to  enter Entrance Stairs-Rails: Right, Left, Can reach both Entrance Stairs-Number of Steps: 5 Bathroom Shower/Tub: Multimedia programmer: [place]: No  Discharge Living Setting Plans for Discharge Living Setting: Patient's home, House, Lives with (comment) (Lives with husband and dtr.) Type of Home at Discharge: House Discharge Home Layout: One level Discharge Home Access: Stairs to enter Entrance Stairs-Rails: Right, Left, Can reach both Entrance Stairs-Number of Steps: 3 to deck at the back entrance Discharge Bathroom Shower/Tub: Tub/shower unit, Walk-in shower, Curtain Discharge Bathroom Toilet: Handicapped height Discharge Bathroom Accessibility: Yes How Accessible: Accessible via walker Does the patient have any problems obtaining your medications?: No  Social/Family/Support Systems Patient Roles: Spouse, Parent (Has husband and a daughter.) Contact Information: Jaunita Mikels - [phone] Anticipated Caregiver: husband Anticipated Caregiver's Contact Information: Juanda Crumble - husband - [phone] Ability/Limitations of Caregiver: Husband is retired and can assist.  Husband uses a walking stick and had hip surgery in the past year. Caregiver Availability: 24/7 Discharge Plan Discussed with Primary Caregiver: Yes Is Caregiver In Agreement with Plan?: Yes Does Caregiver/Family have Issues with Lodging/Transportation while Pt is in Rehab?: No  Goals Patient/Family Goal for Rehab: PT/OT/SLP supervision to min assist goals Expected length of stay: 12-14 days Pt/Family Agrees to Admission and willing to participate: Yes Program Orientation Provided & Reviewed with Pt/Caregiver Including Roles  & Responsibilities: Yes  Decrease burden of Care through IP rehab admission: N/A  Possible need for SNF placement upon discharge: Not planned  Patient Condition: I have reviewed medical records from [facility] health, spoken with CM, and patient and family member. I  met with patient at the bedside for inpatient rehabilitation assessment.  Patient will benefit from ongoing PT, OT, and SLP, can actively participate in 3 hours of therapy a day 5 days of the week, and can make measurable gains during the admission.  Patient will also benefit from the coordinated team approach during an Inpatient Acute Rehabilitation admission.  The patient will receive intensive therapy as well as Rehabilitation physician, nursing, social worker, and care management interventions.  Due to bladder management, bowel management, safety, skin/wound care, disease management, medication administration, pain management, and patient education the patient requires 24 hour a day rehabilitation nursing.  The patient is currently *** with mobility and basic ADLs.  Discharge setting and therapy post discharge at home with home health is anticipated.  Patient has agreed to participate in the Acute Inpatient Rehabilitation Program and will admit {Time; today/tomorrow:10263}.  Preadmission Screen Completed By:  Retta Diones, 02/02/2022 3:37 PM ______________________________________________________________________   Discussed status with Dr. Marland Kitchen on *** at *** and received approval for admission today.  Admission Coordinator:  Retta Diones, RN, time Marland KitchenSudie Grumbling ***   Assessment/Plan: Diagnosis: Does the need for close, 24 hr/day Medical supervision in concert with the patient's rehab needs make it unreasonable for this patient to be served in a less intensive setting? {yes_no_potentially:3041433} Co-Morbidities requiring supervision/potential complications: *** Due to {due DS:8979150}, does the patient require 24 hr/day rehab nursing? {yes_no_potentially:3041433} Does the patient require coordinated care of a physician, rehab nurse, PT, OT, and SLP to address physical and functional deficits in the context of the above medical diagnosis(es)? {yes_no_potentially:3041433} Addressing deficits in the  following areas: {deficits:3041436} Can the patient actively participate in an intensive therapy program of at least 3 hrs of therapy 5 days a week? {yes_no_potentially:3041433} The potential for patient to make measurable gains while on inpatient rehab is {potential:3041437} Anticipated functional outcomes upon discharge from inpatient rehab: {functional outcomes:304600100} PT, {functional outcomes:304600100} OT, {functional outcomes:304600100} SLP Estimated rehab length of stay to reach the above functional goals is: *** Anticipated discharge destination: {anticipated dc setting:21604} 10. Overall Rehab/Functional Prognosis: {potential:3041437}   MD Signature: ***

## 2022-02-02 NOTE — Progress Notes (Signed)
NAME:  Jody Nguyen, MRN:  [id number], DOB:  1956-06-18, LOS: 64 ADMISSION DATE:  01/05/2022, CONSULTATION DATE:  01/07/2022 REFERRING MD:  Marlowe Sax - TRH, CHIEF COMPLAINT:  SOB   History of Present Illness:  66 year old woman who presented to [facility] ED 2/4 with CP radiating to her arm. PMHx significant for ESRD (on HD), non-obstructive CAD, suspected pulmonary hypertension, PE (on Eliquis). Recent ED presentation 2/1 for similar symptoms (troponin 100s). F/u with Cardiology as an outpatient 2/2 and CP was felt to be related to volume shift (occurring during dialysis).   On ED presentation 2/4, patient reported episodic chest pain, non-exertional and intermittent x 1 week with associated nausea and SOB. Non-specific EKG changes. Cardiology consulted, STEMI ruled out. Troponin elevated at 200. Placed on [place] for SpO2 80s. Admitted to [facility] for observation with concern for PE and started on heparin gtt. VQ with low probability for PE. Echo 2/19 demonstrated severe RV/RA enlargement, severely reduced RV function and septal bowing; felt to be consistent with chronic process such as severe pulmonary HTN.    PCCM has been consulted in this setting.  Pertinent Medical History:  ESRD PE Pulmonary HTN  CAD CVA HLD HTN S/p MV repair  Pericardial effusion r/t minoxidil  Diastolic HF DM2  Significant Hospital Events: Including procedures, antibiotic start and stop dates in addition to other pertinent events   2/4 ED presentation with recurring chest pain -- STEMI ruled out. Stable, mildly hypoxic and placed on  2/5 Admit to [facility] for obvs. Concern for PE, ECHO performed by cardiology reportedly shows RV failure, but felt more consistent with chronic process like severe pulmHTN. Awaiting VQ scan. Pulm consulted  2/7 Cardiac cath. 3 vessel dz. CABG rec 2/10 CABG, TVR 2/11 Continued vasoplegic shock with very high vasopressor requirements  2/12 Given methylene blue x 2 2/13 Chest tube removed 2/14 JP  removed 2/15 pacing wires removed 2/16 milrinone stopped, extubated 2/17 Zosyn for GNR bacteremia-> Morganella morganii 2/18 Arterial line, central line, vascath all removed and replaced. 2/19 Milrinone back on. Co-ox improved. ECHO Left ventricular ejection fraction, by estimation, is 50 to 55%. The left ventricle has low normal function. No regional WM abnormality. Right Ventricle: Ventricular septum is flattened in systole and diastole  suggesting RV pressure and volume overload. The right ventricular size is  severely enlarged. Right vetricular wall thickness was not well  visualized. Right ventricular systolic  function is severely reduced. There is moderately elevated pulmonary  artery systolic pressure 0/62 still high pressor needs. CRRT stopped. Having intermittent bouts of tachycardia 2/21 Hemodynamics better. Weaning Epi. Still on Norepi and inotropic support. Supported on BIPAP over night and tolerated well. On am rounds more lethargic w/ generalized pain everywhere. Co-ox > 70. Glucose poorly controlled. 2/22 Escalating pressor requirements (NE, Epi, Vaso). Continues on milrinone. Afib with RVR overnight, now with persistent AF (on amio, bival). Co-ox 73%. Hgb 7.3. WBC uptrending (25 from 20), Cr uptrending, CRRT restarted. Tolerating BiPAP. 2/23 Improving pressor needs. Stable Epi/Vaso, downtitrating NE. Milrinone 0.25. Persistent AF, rate controlled. Co-ox 65.7%. Ongoing CRRT. BiPAP QHS. 2/24 Continued decreasing pressor needs, stable epi/vaso, downtitrating Levo.  Milrinone 0.25.  Intermittent A-fib, in and out of NSR, rates 90s.  Co-ox 76.1%.  Ongoing CRRT, did not utilize BiPAP overnight. FEES with SLP. 2/25 confused.  Pressor requirements down some.  Got 1 unit of blood for hemoglobin of 7, Co. ox dropped. Vasopressin decreased to 0.03 units.  3/2 stopped epinephrine, Co. oximetry dropped quickly 3/3 back on  epinephrine, with plan to eventually titrate vasopressin off and trial  dobutamine.  Currently on room air.  Co. oximetry's in the 40s, hypoglycemic requiring titration down on basal dose insulin after changing tube feeds to nocturnal 3/6 off Epi since 3 am  3/9 Has remained off pressors since 3/6, remains confused with worsened agitation 3/8 per spouse   Interim History / Subjective:  Seen after return from IR, she is slightly lethargic from sedation but seems to nod appropriately   Objective:  Blood pressure (!) 107/54, pulse 76, temperature (!) 97.1 F (36.2 C), temperature source Axillary, resp. rate 11, height 4' 11.5" (1.511 m), weight 69.8 kg, SpO2 100 %. PAP: (21-57)/(13-30) 21/13 CVP:  [14 mmHg-21 mmHg] 18 mmHg CO:  [0 L/min-4.7 L/min] 0 L/min CI:  [2.8 L/min/m2-2.9 L/min/m2] 2.9 L/min/m2      Intake/Output Summary (Last 24 hours) at 02/02/2022 1242 Last data filed at 02/02/2022 0700 Gross per 24 hour  Intake 580.34 ml  Output 300 ml  Net 280.34 ml    Filed Weights   02/01/22 0730 02/01/22 1030 02/02/22 0429  Weight: 70.5 kg 70.1 kg 69.8 kg   Physical Examination: General: Acute on chronically ill appearing deconditioned elderly female lying in bed, in NAD HEENT: [place]/AT, MM pink/moist, PERRL,  Neuro: Sleepy but arousal, seems to nod appropriately  CV: s1s2 regular rate and rhythm, no murmur, rubs, or gallops,  PULM:  Clear to ascultation bilaterally, no increased work of breathing, no added breath sounds  GI: soft, bowel sounds active in all 4 quadrants, non-tender, non-distended, tolerating TF/oral diet Extremities: warm/dry, no edema  Skin: no rashes or lesions  Resolved problem:  Post-op thrombocytopenia  -Platelets drop to 30, swapped to Bival, HIT negative  Acute hypoxemia respiratory failure   Assessment & Plan:  Acute decompensated R>L heart failure with cardiogenic shock -Felt secondary to end-stage RV failure (required ECMO in 2016 post MVR) -Echo 01/15/22 with EF 50-55%, D-shaped septum with severe RV dysfunction and  enlargement, s/p MV repair, s/p TV repair but still with severe TR -Milrinone stopped 3/1 -Vasopressors stopped 3/6 Severe 3 vessel CAD now S/P CABG P: HF following  Continuous telemetry  Continue ASA and statin  Remains on IV heparin drip Strict intake and output  Daily weight to assess volume status Closely monitor renal function and electrolytes  Provide supplemtal oxygen as needed to maintain oxygen saturations above 90%  Pulmonary artery hypertension with cor-pulmonale, end-stage - mixed picture for PH (predominantly WHO Groups II-III but perhaps contributions from I & IV) Hx of PE P: Macitentan stopped per HF Supportive care   Morganella bacteremia treated -Blood cultures positive 2/17 and compelted 7 days of therapy but 3/5 patient became febrile again and antibiotics were resumed. PCT at that time 1.05 P: Remains on Vanc/cefepime per CTS and HF   Atrial fibrillation on AC and amio P: Remains on IV amio and IV heparin per CTS and HF  Continuous telemetry   ESRD on HD- thrombosed AVF -IR to place [facility] 3/6 P: Nephrology following  Follow renal function  Trend Bmet Avoid nephrotoxins Ensure adequate renal perfusion   Dysphagia P: SLP following and diet advanced to dsy 3 nectar thick   Anemia of chronic renal failure -S/P 3 units PRBC, 3 units Platelets, and 2 untis FFP P: Trend CBC Transfuse per protocol Hgb goal > 7   DM2 with hyperglycemia  P: Continue SSI and 12 units levemir  -Currently on levemir + SSI  Probable renal vasoplegia  -Off  pressors since 3/6 P: Continue Midodrine   ICU delirium  -Per spouse patients confusion and agitation peaked 3/8  P: Currently she is sleepy secondary to sedation used for [facility] placement  Delirium precautions  Transfer out of ICU  Maintain sleep wake cycle  If confusion persist need to consider head CT given complicated medical picture   Best Practice: (right click and "Reselect all SmartList Selections"  daily)   Diet/type: tubefeeds  , dysphagia 3 DVT prophylaxis: other- bivalirudin GI prophylaxis: PPI Lines: Central line, Dialysis Catheter, Arterial Line, and yes and it is still needed  Foley:  N/A Code Status:  full code Last date of multidisciplinary goals of care discussion [Husband updated 3/6 at bedside]    Galvin Aversa D. Kenton Kingfisher, NP-C [facility] Personal contact information can be found on Amion  02/02/2022, 1:07 PM

## 2022-02-02 NOTE — Sedation Documentation (Signed)
As per MD, pt already on antibiotics, Cefepime, No additional antibiotics needed ?

## 2022-02-02 NOTE — Progress Notes (Addendum)
Occupational Therapy Treatment Patient Details Name: Jody Nguyen MRN: [id number] DOB: Aug 29, 1956 Today's Date: 02/02/2022   History of present illness 66 yo female presenting 2/5 with recurrent chest pain. Pt now s/p CABG x3 and tricuspid valve annuloplasty on 2/10 requiring 6 units PRBCs, 2 units FFP, and 2 bags of platelets and resulting in vasoplegic shock with very high vasopressor requirements. Started on CRRT 2/11, extubated 2/16. S/p right heart cath 3/1 and Swan-ganz placed. Off CRRT 3/7. S/p L IJ HD catheter placement 3/9. PMH includes: anemia, CHF, CKD IV on HD MWF, CVA, DM II, HLD, HTN, pulmonary HTN.   OT comments  Pt progressing slowly towards established OT goals. Pt requiring Mod A +2 for bed mobility and then sitting at EOB with Min Guard-Min A. Pt presenting with tendency for L lateral lean in sitting as well as pushing with RUE (despite significant edema and discomfort). Pt requiring Max a +2 for stand pivot to recliner. Continued L lateral lean with RUE pushing while sitting in recliner. Pt then participating in sit<>stand from recliner with Max A +2. Pt presenting with decreased orientation, attention, and awareness. Perseverating on certain topics and decreased recall of education/commands. Also, noting pt continues to present with slight R gaze and head turn with decreased visual tracking (passed midline) and attending to L. Notified RN and MD. Continue to recommend dc to AIR. Will continue to follow acutely as admitted.    Recommendations for follow up therapy are one component of a multi-disciplinary discharge planning process, led by the attending physician.  Recommendations may be updated based on patient status, additional functional criteria and insurance authorization.    Follow Up Recommendations  Acute inpatient rehab (3hours/day)    Assistance Recommended at Discharge Frequent or constant Supervision/Assistance  Patient can return home with the following  Two  people to help with walking and/or transfers;Two people to help with bathing/dressing/bathroom   Equipment Recommendations  Other (comment) (Defer to next venue)    Recommendations for Other Services PT consult    Precautions / Restrictions Precautions Precautions: Fall;Sternal;Other (comment) Precaution Booklet Issued: No Precaution Comments: flexiseal, cortrack Restrictions Weight Bearing Restrictions: Yes Other Position/Activity Restrictions: sternal precautions       Mobility Bed Mobility Overal bed mobility: Needs Assistance Bed Mobility: Supine to Sit     Supine to sit: Mod assist, +2 for physical assistance, +2 for safety/equipment, HOB elevated     General bed mobility comments: Several minutes and repeated cues to direct legs off L EOB, modAx2 to manage legs and trunk and scoot hips to EOB with use of bed pads.    Transfers Overall transfer level: Needs assistance Equipment used: 2 person hand held assist Transfers: Bed to chair/wheelchair/BSC, Sit to/from Stand Sit to Stand: Max assist, +2 physical assistance, +2 safety/equipment Stand pivot transfers: Max assist, +2 physical assistance, +2 safety/equipment         General transfer comment: Cues provided for pt to hold onto each therapist's arm with support provided at pt's buttocks to asisst in extending hips coming to stand 1x from elevated EOB and 2x from recliner. Bil knee block provided, needing maxAx2 to come to stand then pivot to L.     Balance Overall balance assessment: Needs assistance Sitting-balance support: Bilateral upper extremity supported, Feet supported, Single extremity supported, No upper extremity supported Sitting balance-Leahy Scale: Poor Sitting balance - Comments: Pt with L lateral lean and pushing with R UE to her L at EOB and in recliner, needing repeated cues to  place hands in lap. Pt varying from min guard-minA to sit statically. Postural control: Left lateral lean Standing  balance support: Bilateral upper extremity supported, During functional activity Standing balance-Leahy Scale: Poor Standing balance comment: MaxAx2, bil knee block, and bil UE support to stand 3x for up to ~5 - 10 sec each                           ADL either performed or assessed with clinical judgement   ADL Overall ADL's : Needs assistance/impaired                         Toilet Transfer: Maximal assistance;+2 for physical assistance;+2 for safety/equipment;Stand-pivot;Requires drop arm (simualted to recliner)           Functional mobility during ADLs: Maximal assistance;+2 for physical assistance (stand and step to recliner) General ADL Comments: Pt requiring Max A +2 for stand and then slight step to recliner.    Extremity/Trunk Assessment Upper Extremity Assessment Upper Extremity Assessment: RUE deficits/detail;LUE deficits/detail RUE Deficits / Details: edematous RUE with limited AROM. Pt however with tendecy to push with RUE to L despite discomfort RUE Coordination: decreased fine motor;decreased gross motor LUE Deficits / Details: Generalized weakness LUE Coordination: decreased fine motor;decreased gross motor   Lower Extremity Assessment Lower Extremity Assessment: Defer to PT evaluation        Vision   Vision Assessment?: Yes Tracking/Visual Pursuits: Impaired - to be further tested in functional context Additional Comments: Pt tracking to R without difficulty. Tracking L to midline but then unable to sustain to L. Pt not attending to nursing, therapists, or husband on L   Perception Perception Perception: Impaired (not attending to L with slight head turn and gaze to R)   Praxis      Cognition Arousal/Alertness: Awake/alert Behavior During Therapy: Flat affect, Restless Overall Cognitive Status: Impaired/Different from baseline Area of Impairment: Attention, Memory, Following commands, Safety/judgement, Problem solving, Awareness,  Orientation                 Orientation Level: Person Current Attention Level: Sustained, Focused Memory: Decreased short-term memory, Decreased recall of precautions Following Commands: Follows one step commands inconsistently, Follows one step commands with increased time Safety/Judgement: Decreased awareness of safety, Decreased awareness of deficits Awareness: Intellectual Problem Solving: Slow processing, Decreased initiation, Difficulty sequencing, Requires verbal cues, Requires tactile cues General Comments: Upon arrival, pt supine (HOB elevated) with eyes open and husband at bedside. Pt oriented only to self (and her husband). Pt perverating on two comments "why do you think I am crazy" and "you are making fun of me". Pt with difficulty sustaining attention to task and conversation. Not attending to people on L side.        Exercises      Shoulder Instructions       General Comments SpO2 90s on RA. HR and BP stable. Notified RN of pt's inattention to L, decreased visual tracking to L, and pushing to L with RUE while seated.    Pertinent Vitals/ Pain       Pain Assessment Pain Assessment: Faces Faces Pain Scale: Hurts little more Pain Location: bil UEs Pain Descriptors / Indicators: Discomfort, Grimacing Pain Intervention(s): Monitored during session, Limited activity within patient's tolerance, Repositioned  Home Living  Prior Functioning/Environment              Frequency  Min 2X/week        Progress Toward Goals  OT Goals(current goals can now be found in the care plan section)  Progress towards OT goals: Progressing toward goals  Acute Rehab OT Goals OT Goal Formulation: With patient/family Time For Goal Achievement: 02/14/22 Potential to Achieve Goals: Good ADL Goals Pt Will Perform Grooming: with min assist;sitting Pt/caregiver will Perform Home Exercise Program: Both right and left  upper extremity;With written HEP provided;With minimal assist Additional ADL Goal #1: Pt will tolerate 20 minutes of semi standing position with kreg bed tilted at 45* in preparation for ADLs Additional ADL Goal #2: Pt will demonstrate selective attention during ADLs with Min cues  Plan Discharge plan remains appropriate    Co-evaluation    PT/OT/SLP Co-Evaluation/Treatment: Yes Reason for Co-Treatment: To address functional/ADL transfers;For patient/therapist safety;Necessary to address cognition/behavior during functional activity PT goals addressed during session: Mobility/safety with mobility;Balance OT goals addressed during session: ADL's and self-care      AM-PAC OT "6 Clicks" Daily Activity     Outcome Measure   Help from another person eating meals?: A Lot Help from another person taking care of personal grooming?: A Lot Help from another person toileting, which includes using toliet, bedpan, or urinal?: Total Help from another person bathing (including washing, rinsing, drying)?: Total Help from another person to put on and taking off regular upper body clothing?: Total Help from another person to put on and taking off regular lower body clothing?: Total 6 Click Score: 8    End of Session Equipment Utilized During Treatment: Gait belt  OT Visit Diagnosis: Other abnormalities of gait and mobility (R26.89);Unsteadiness on feet (R26.81);Muscle weakness (generalized) (M62.81)   Activity Tolerance Patient tolerated treatment well   Patient Left in chair;with call bell/phone within reach   Nurse Communication Mobility status        Time: 1535-1610 OT Time Calculation (min): 35 min  Charges: OT General Charges $OT Visit: 1 Visit OT Treatments $Therapeutic Activity: 8-22 mins  [place], OTR/L Acute Rehab Pager: [phone] Office: [place] 02/02/2022, 5:38 PM

## 2022-02-02 NOTE — Procedures (Signed)
Interventional Radiology Procedure Note ? ?Procedure: LT IJ HD CATH   ? ?Complications: None ? ?Estimated Blood Loss:  MIN ? ?Findings: ?TIP SVCRA ?   ? ?M. Daryll Brod, MD ? ? ? ?

## 2022-02-02 NOTE — Progress Notes (Signed)
Nutrition Follow-up ? ?DOCUMENTATION CODES:  ? ?Not applicable ? ?INTERVENTION:  ? ?Tube Feeding via Cortrak:  ?Vital 1.5 at 60 ml/hr x 10 hours ?Provides 900 kcals, 41 g of protein and 1094 mL of free water ?  ?90 mL Pro-Source TF per tube BID,  each supplement provides 40 kcals and 11 grams protein.  ?  ?Magic cup BID with meals, each supplement provides 290 kcal and 9 grams of protein ?  ?Feeding assistance at all meals ?  ?Pt declines oral nutrition supplements such as Ensure/Boost ? ? ?NUTRITION DIAGNOSIS:  ? ?Inadequate oral intake related to inability to eat as evidenced by NPO status. ? ?Being addressed via TF  ? ?GOAL:  ? ?Patient will meet greater than or equal to 90% of their needs ? ?Progressing  ? ?MONITOR:  ? ?Diet advancement, Labs, Weight trends, TF tolerance, Skin, I & O's ? ?REASON FOR ASSESSMENT:  ? ?Consult ?Enteral/tube feeding initiation and management ? ?ASSESSMENT:  ? ?66 y.o. female with history of HTN, DM type 2, HKD, pulmonary HTN, ESRD on HD, CHF, hx CVA, and vitamin D deficiency, presented to ED with radiating chest pain, SOB, and nausea. ? ?2/07 Left heart catherization, showed severe coronary calcification and multivessel CAD. ?2/10  CABG x 3, TVR ?2/11  CRRT initiated ?2/13 TF initiated ?2/14 TF on  hold for emesis ?2/15 cortrak placed (gastric), TF resumed ?2/16 extubated ?2/27 Diet advanced to Dysphagia 3, Nectar post MBS ?3/02 TF changed to nocturnal ?3/07 nocturnal TF rate and volume reduced  ? ?Off pressors since 3/06  ? ?Pt more alert today but remains confused.  ? ?Recorded po intake 25% at lunch and 10% at dinner yesterday. Pt finally eating a little bit. NPO again today for procedure ? ?Tolerating nocturnal TF via Cortrak ? ?Tolerated iHD yesterday, plan for iHD again tomorrow ?NPO this AM for tunneled HD cath ? ?Hypoglycemic this AM, likely due to TF on hold for procedure. Pt on sliding scale coverage and levemir BID ? ?Phosphorus improved off CRRT, now wdl ? ?Weight down to  69.8 kg; outpt EDW 75.5 kg ? ?Vitamin Lab Panel:  ?Vitamin B1: 214 (H) ?Vitamin B6: 6.9 (wdl) ?Vitamin B12: 1742 (H) ?Vitamin C: 0.8 (wdl) ?Folate: 15.8 (wdl) ?Copper: 156 (wdl) ?Zinc: 98 (wdl) ?  ? ?Labs: CBGs 69-160 ?Meds: ss novolog, levemir, rena-vite ? ?Diet Order:   ?Diet Order   ? ?       ?  Diet NPO time specified Except for: Sips with Meds  Diet effective midnight       ?  ? ?  ?  ? ?  ? ? ?EDUCATION NEEDS:  ? ?Not appropriate for education at this time ? ?Skin:  Skin Assessment: Skin Integrity Issues: ?Skin Integrity Issues:: Stage II ?Stage II: rectum ?Incisions: closed chest, rt leg ? ?Last BM:  3/9 rectal tube ? ?Height:  ? ?Ht Readings from Last 1 Encounters:  ?01/01/22 4' 11.5" (1.511 m)  ? ? ?Weight:  ? ?Wt Readings from Last 1 Encounters:  ?02/02/22 69.8 kg  ? ? ?Ideal Body Weight:  45.5 kg ? ?BMI:  Body mass index is 30.56 kg/m?. ? ?Estimated Nutritional Needs:  ? ?Kcal:  1800-2000 kcals ? ?Protein:  90-105 grams ? ?Fluid:  1L+UOP ? ? ?Kerman Passey MS, RDN, LDN, CNSC ?Registered Dietitian III ?Clinical Nutrition ?RD Pager and On-Call Pager Number Located in [place]  ? ?

## 2022-02-02 NOTE — Progress Notes (Signed)
Patient ID: Jody Nguyen, female   DOB: [birth year], 66 y.o.   MRN: [id number] ? ?Hemodynamically stable in sinus rhythm.  ? ?Sats 95% on RA. ? ?Transitioned to [facility], next HD tomorrow. ?

## 2022-02-02 NOTE — Plan of Care (Signed)

## 2022-02-02 NOTE — Progress Notes (Signed)
IP rehab admissions - I met with patient's husband at the bedside.  I gave husband rehab booklets and explained inpatient rehab option.  Patient was in IR earlier today during my visit.  I will follow up with patient in the am for progress and plans.  Call for questions.  [phone] ?

## 2022-02-02 NOTE — Progress Notes (Signed)
? ? Advanced Heart Failure Rounding Note ? ?PCP-Cardiologist: [name], MD  ? ?Subjective:   ? ?3/1: RHC with swan placement (on NE 2, EPi 3, Milrinone 0.25 and VP 0.02) w/ surprisingly well-compensated hemodynamics (see data below). Milrinone discontinued given low SVR ?3/5: Empiric vancomycin/cefepime started for fever, temp 100, rising WBCs and PCT (1.05) ? ? ?Remains off pressors.  Tolerated iHD yesterday  ? ?Continues with intermittent delirium. No CP or SOB. Luiz Blare out.  ? ? ?Objective:   ?Weight Range: ?69.8 kg ?Body mass index is 30.56 kg/m?.  ? ?Vital Signs:   ?Temp:  [97.1 ?F (36.2 ?C)-99.3 ?F (37.4 ?C)] 97.1 ?F (36.2 ?C) (03/09 0700) ?Pulse Rate:  [79-87] 81 (03/09 0800) ?Resp:  [10-29] 12 (03/09 0800) ?SpO2:  [88 %-100 %] 100 % (03/09 0800) ?Arterial Line BP: (82-107)/(37-57) 103/52 (03/09 0800) ?Weight:  [69.8 kg-70.1 kg] 69.8 kg (03/09 0429) ?Last BM Date : 02/01/22 ? ?Weight change: ?Filed Weights  ? 02/01/22 0730 02/01/22 1030 02/02/22 0429  ?Weight: 70.5 kg 70.1 kg 69.8 kg  ? ? ?Intake/Output:  ? ?Intake/Output Summary (Last 24 hours) at 02/02/2022 0906 ?Last data filed at 02/02/2022 0700 ?Gross per 24 hour  ?Intake 866.26 ml  ?Output 700 ml  ?Net 166.26 ml  ? ?  ? ? ?Physical Exam  ? ?General:  Lying in bed. Weak appearing. No resp difficulty ?HEENT: normal + cor-trak ?Neck: supple. JVP to jawCarotids 2+ bilat; no bruits. No lymphadenopathy or thryomegaly appreciated. ?Cor: PMI nondisplaced. Regular rate & rhythm. 2/6 TR ?Lungs: clear ?Abdomen: soft, nontender, nondistended. No hepatosplenomegaly. No bruits or masses. Good bowel sounds. ?Extremities: no cyanosis, clubbing, rash, edema ?Neuro: alert confused at times. Moves all 4 ? ? ?Telemetry  ? ?NSR 70-80s Personally reviewed ? ?Labs  ?  ?CBC ?Recent Labs  ?  02/01/22 ?7893 02/02/22 ?0353  ?WBC 15.6* 14.6*  ?NEUTROABS 9.9* 9.3*  ?HGB 7.9* 8.2*  ?HCT 26.4* 27.0*  ?MCV 102.3* 101.1*  ?PLT 199 210  ? ? ?Basic Metabolic Panel ?Recent Labs  ?   01/31/22 ?0439 02/01/22 ?8101 02/02/22 ?0353  ?NA 137 135 135  ?K 4.1 4.5 4.1  ?CL 104 102 100  ?CO2 _0 ?GLUCOSE 155* 151* 153*  ?BUN 12 25* 25*  ?CREATININE 1.11* 2.13* 2.41*  ?CALCIUM 9.4 9.7 10.1  ?MG 2.4  --   --   ?PHOS 1.8* 2.9 2.8  ? ? ?Liver Function Tests ?Recent Labs  ?  02/01/22 ?7510 02/02/22 ?0353  ?ALBUMIN 2.3* 2.3*  ? ? ?No results for input(s): LIPASE, AMYLASE in the last 72 hours. ?Cardiac Enzymes ?No results for input(s): CKTOTAL, CKMB, CKMBINDEX, TROPONINI in the last 72 hours. ? ?BNP: ?BNP (last 3 results) ?Recent Labs  ?  12/20/21 ?2246 01/01/22 ?0941  ?BNP 1,753.1* 1,432.4*  ? ? ? ?ProBNP (last 3 results) ?No results for input(s): PROBNP in the last 8760 hours. ? ? ?D-Dimer ?No results for input(s): DDIMER in the last 72 hours. ?Hemoglobin A1C ?No results for input(s): HGBA1C in the last 72 hours. ?Fasting Lipid Panel ?No results for input(s): CHOL, HDL, LDLCALC, TRIG, CHOLHDL, LDLDIRECT in the last 72 hours. ?Thyroid Function Tests ?No results for input(s): TSH, T4TOTAL, T3FREE, THYROIDAB in the last 72 hours. ? ?Invalid input(s): FREET3 ? ?Other results: ? ? ?Imaging  ? ? ?No results found. ? ? ?Medications:   ? ? ?Scheduled Medications: ? amiodarone  200 mg Oral BID  ? aspirin EC  325 mg Oral Daily  ? Or  ?  aspirin  324 mg Per Tube Daily  ? chlorhexidine gluconate (MEDLINE KIT)  15 mL Mouth Rinse BID  ? Chlorhexidine Gluconate Cloth  6 each Topical Daily  ? darbepoetin (ARANESP) injection - NON-DIALYSIS  100 mcg Subcutaneous Q Sat-1800  ? doxercalciferol  9 mcg Intravenous Q M,W,F-HD  ? ezetimibe  10 mg Oral Daily  ? feeding supplement (PROSource TF)  45 mL Per Tube BID  ? feeding supplement (VITAL 1.5 CAL)  600 mL Per Tube Q24H  ? insulin aspart  0-20 Units Subcutaneous Q4H  ? insulin detemir  12 Units Subcutaneous BID  ? latanoprost  1 drop Both Eyes QHS  ? midodrine  15 mg Oral Q6H  ? multivitamin  1 tablet Per Tube QHS  ? pantoprazole sodium  40 mg Per Tube Daily  ? rosuvastatin   10 mg Per Tube QHS  ? sodium chloride flush  10-40 mL Intracatheter Q12H  ? sodium chloride flush  3 mL Intravenous Q12H  ? vancomycin variable dose per unstable renal function (pharmacist dosing)   Does not apply See admin instructions  ? ? ?Infusions: ? sodium chloride    ? sodium chloride Stopped (02/01/22 2059)  ? sodium chloride 10 mL/hr at 01/31/22 2057  ? sodium chloride Stopped (01/31/22 2057)  ? ceFEPime (MAXIPIME) IV Stopped (02/01/22 2129)  ? heparin 1,150 Units/hr (02/02/22 0700)  ? ? ?PRN Medications: ?sodium chloride, sodium chloride, bisacodyl **OR** bisacodyl, docusate, food thickener, Gerhardt's butt cream, hydrOXYzine, lip balm, ondansetron (ZOFRAN) IV, oxyCODONE, sodium chloride flush, sodium chloride flush, traMADol ? ? ? ?Patient Profile  ? ?66 y/o female with obesity, T2DM, previous PE, ESRD and mitral regurgitation, 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 Subsequently followed at [facility] for mixed Roberts on macitentan and sildenafil. Admitted 2/5 for CP. LHC showed severe 3v CAD with CTO of RCA & LCX and critical ostial LAD lesion. Initial echo, EF 40-45%, no MR, mod MS, RV mildly reduced w/ severe TR. Underwent CABG w/ PFO closure and tricuspid valve repair/valvuloplasty w/ ring 01/22/2022. Complicated post CABG course with RV failure, GN bacteremia and persistent hypotension and inability to wean off pressors. Nephrology following for CVVH. ? ?Assessment/Plan  ? ?1. Post-cardiotomy cardiogenic shock ?- likely due predominantly to end-stage RV failure. (Required ECMO in 2016 post MVR). Echo 01/15/22 with EF 50-55%, D-shaped septum with severe RV dysfunction and enlargement, s/p MV repair, s/p TV repair but still with severe TR.  ?- Post op platelets down to 30. Switched to Bival. HIT negative. Plts improved  ?- RHC 3/1 revealed surprisingly well-compensated hemodynamics on 4 vasopressors/inotropes. CO/CI 6.4/3.8, RA 11, PCW 17, PAPi 2.45  ?- Milrinone stopped 3/1 w/ low SVR (650)  ?- Now  off IV pressor support. SBPs 90-low 100s, on Midodrine 15 q6h. No change ?- Tolerated iHD 3/8 ?- Plan TDC today via [facility] with IR then can remove groin lines  ?  ?2. PAH with cor-pulmonale, end-stage ?- she clearly has mixed PH (predominantly WHO Groups II-III but perhaps contributions from I & IV) ?- Stop macitentan. Given her hemodynamics  doubt she will have much to gain from further selective pulmonary artery vasodilators. PVR not elevated.   ? ?3. Severe 3vCAD s/p CABG x3 with closure PFO + TV ring on 01/18/2022 ?- No s/s angina ?- continue ASA/Statin ?  ?4. ESRD ?- currently tolerating iHD. ?- Would not push volume removal further given RV failure ?- As above. Plan for [facility] today ? ?  ?5. SVT/possible  PAF ?- maintaining NSR ?- now off pressors, continue PO amio 200 mg bid  ?- plts recovered. HIT negative.  ?- Switched from Bival back to heparin 3/2 -> apixaban one TDC placed ?  ?6. H/O PE ?- 11/2020 VQ : unchanged left lower lobe apical segment PE ?- VQ 01/02/22 low prob ?- Continue heparin gtt  for now -> apixaban once TDC palced ?  ?7. H/O MV Ring 2016 ?- stable on echo  ? ?8. Anemia  ?- Hgb stable 8.2 ?- transfuse for hgb < 7.0  ?- iron stores normal  ? ?9. ID ?- morganella morganii bacteremia: completed abx. BCx 2/18 cleared  ?- abx reinitiated 3/5 for fever, worsening leukocytosis and PCT 1.05  ?- AF today, WBC down trending  ?- treating w/ empiric vancomycin/cefepime x 7 days (end-date 3/12)  ? ?10. Severe TR despite TV repair ? ?11. Delirium  ?- continue with re-orientation ? ? ?Glori Bickers, MD  ?9:06 AM ? ? ? ?

## 2022-02-02 NOTE — Progress Notes (Signed)
eLink Physician-Brief Progress Note ?Patient Name: Jody Nguyen ?DOB: 1956-10-27 ?MRN: [id number] ? ? ?Date of Service ? 02/02/2022  ?HPI/Events of Note ? Patient pulled SG Catheter and protective cover out. Will not be able to re-advance SG catheter.   ?eICU Interventions ? Will D/C SG catheter.   ? ? ? ?Intervention Category ?Major Interventions: Other: ? ?Jody Nguyen ?02/02/2022, 3:25 AM ?

## 2022-02-02 NOTE — Progress Notes (Signed)
8 Days Post-Op Procedure(s) (LRB): ?RIGHT HEART CATH (N/A) ?Subjective: ?Pulled out Swan overnight ?Alert and responsive this AM ? ?Objective: ?Vital signs in last 24 hours: ?Temp:  [97.1 ?F (36.2 ?C)-99.3 ?F (37.4 ?C)] 97.1 ?F (36.2 ?C) (03/09 0700) ?Pulse Rate:  [79-87] 79 (03/09 0700) ?Cardiac Rhythm: Normal sinus rhythm (03/08 2000) ?Resp:  [10-29] 27 (03/09 0700) ?SpO2:  [88 %-100 %] 99 % (03/09 0700) ?Arterial Line BP: (82-107)/(37-57) 104/52 (03/09 0700) ?Weight:  [69.8 kg-70.1 kg] 69.8 kg (03/09 0429) ? ?Hemodynamic parameters for last 24 hours: ?PAP: (21-61)/(13-39) 21/13 ?CVP:  [13 mmHg-21 mmHg] 17 mmHg ?CO:  [4.4 L/min-4.7 L/min] 4.7 L/min ?CI:  [2.8 L/min/m2-2.9 L/min/m2] 2.9 L/min/m2 ? ?Intake/Output from previous day: ?03/08 0701 - 03/09 0700 ?In: 913.4 [P.O.:315; I.V.:318.3; NG/GT:180; IV Piggyback:100.1] ?Out: 700 [Emesis/NG output:100; WYSHU:837] ?Intake/Output this shift: ?No intake/output data recorded. ? ?General appearance: alert and cooperative ?Neurologic: mild confusion, no focal deficit ?Heart: regular rate and rhythm ?Lungs: clear to auscultation bilaterally ?Wound: clean and dry ? ?Lab Results: ?Recent Labs  ?  02/01/22 ?2902 02/02/22 ?0353  ?WBC 15.6* 14.6*  ?HGB 7.9* 8.2*  ?HCT 26.4* 27.0*  ?PLT 199 210  ? ?BMET:  ?Recent Labs  ?  02/01/22 ?1115 02/02/22 ?0353  ?NA 135 135  ?K 4.5 4.1  ?CL 102 100  ?CO2 23 24  ?GLUCOSE 151* 153*  ?BUN 25* 25*  ?CREATININE 2.13* 2.41*  ?CALCIUM 9.7 10.1  ?  ?PT/INR: No results for input(s): LABPROT, INR in the last 72 hours. ?ABG ?   ?Component Value Date/Time  ? PHART 7.402 01/30/2022 2208  ? HCO3 27.0 01/30/2022 2208  ? TCO2 28 01/30/2022 2208  ? ACIDBASEDEF 1.0 01/20/2022 1944  ? O2SAT 99 01/30/2022 2208  ? ?CBG (last 3)  ?Recent Labs  ?  02/01/22 ?2007 02/01/22 ?2302 02/02/22 ?0350  ?GLUCAP 95 145* 160*  ? ? ?Assessment/Plan: ?S/P Procedure(s) (LRB): ?RIGHT HEART CATH (N/A) ?- ?Slow progression ?NEURO- some confusion overnight c/w ICU delirium ?CV- BP  99/53, 80 SR on midodrine ? Right heart failure secondary to pulmonary hypertension ? Well compensated at present ?RESP- Stable ?RENAL- HD yesterday ? Plan for next HD per Nephrology ? Hopefully can get tunneled IJ catheter today and remove groin lines ?GI- tolerating Tf ? PO intake slowly improving ?ID- WBC continues to trend down on Maxipime ?ENDO- CBG mildy elevated overnight ? ? LOS: 32 days  ? ? ?Jody Nguyen ?02/02/2022 ? ? ?

## 2022-02-03 ENCOUNTER — Inpatient Hospital Stay (HOSPITAL_COMMUNITY): Payer: Medicare Other

## 2022-02-03 DIAGNOSIS — G9341 Metabolic encephalopathy: Secondary | ICD-10-CM

## 2022-02-03 DIAGNOSIS — R072 Precordial pain: Secondary | ICD-10-CM | POA: Diagnosis not present

## 2022-02-03 DIAGNOSIS — N186 End stage renal disease: Secondary | ICD-10-CM | POA: Diagnosis not present

## 2022-02-03 DIAGNOSIS — J9601 Acute respiratory failure with hypoxia: Secondary | ICD-10-CM | POA: Diagnosis not present

## 2022-02-03 DIAGNOSIS — L8990 Pressure ulcer of unspecified site, unspecified stage: Secondary | ICD-10-CM | POA: Insufficient documentation

## 2022-02-03 DIAGNOSIS — N189 Chronic kidney disease, unspecified: Secondary | ICD-10-CM | POA: Diagnosis not present

## 2022-02-03 LAB — CBC WITH DIFFERENTIAL/PLATELET
Abs Immature Granulocytes: 0.13 10*3/uL — ABNORMAL HIGH (ref 0.00–0.07)
Basophils Absolute: 0.1 10*3/uL (ref 0.0–0.1)
Basophils Relative: 1 %
Eosinophils Absolute: 1.4 10*3/uL — ABNORMAL HIGH (ref 0.0–0.5)
Eosinophils Relative: 14 %
HCT: 28.7 % — ABNORMAL LOW (ref 36.0–46.0)
Hemoglobin: 8.9 g/dL — ABNORMAL LOW (ref 12.0–15.0)
Immature Granulocytes: 1 %
Lymphocytes Relative: 13 %
Lymphs Abs: 1.3 10*3/uL (ref 0.7–4.0)
MCH: 31 pg (ref 26.0–34.0)
MCHC: 31 g/dL (ref 30.0–36.0)
MCV: 100 fL (ref 80.0–100.0)
Monocytes Absolute: 1.2 10*3/uL — ABNORMAL HIGH (ref 0.1–1.0)
Monocytes Relative: 12 %
Neutro Abs: 6 10*3/uL (ref 1.7–7.7)
Neutrophils Relative %: 59 %
Platelets: 223 10*3/uL (ref 150–400)
RBC: 2.87 MIL/uL — ABNORMAL LOW (ref 3.87–5.11)
RDW: 23 % — ABNORMAL HIGH (ref 11.5–15.5)
Smear Review: ADEQUATE
WBC: 10.1 10*3/uL (ref 4.0–10.5)
nRBC: 4.8 % — ABNORMAL HIGH (ref 0.0–0.2)

## 2022-02-03 LAB — GLUCOSE, CAPILLARY
Glucose-Capillary: 119 mg/dL — ABNORMAL HIGH (ref 70–99)
Glucose-Capillary: 151 mg/dL — ABNORMAL HIGH (ref 70–99)
Glucose-Capillary: 160 mg/dL — ABNORMAL HIGH (ref 70–99)
Glucose-Capillary: 179 mg/dL — ABNORMAL HIGH (ref 70–99)
Glucose-Capillary: 71 mg/dL (ref 70–99)
Glucose-Capillary: 79 mg/dL (ref 70–99)

## 2022-02-03 LAB — BASIC METABOLIC PANEL
Anion gap: 11 (ref 5–15)
BUN: 38 mg/dL — ABNORMAL HIGH (ref 8–23)
CO2: 23 mmol/L (ref 22–32)
Calcium: 10.1 mg/dL (ref 8.9–10.3)
Chloride: 96 mmol/L — ABNORMAL LOW (ref 98–111)
Creatinine, Ser: 3.46 mg/dL — ABNORMAL HIGH (ref 0.44–1.00)
GFR, Estimated: 14 mL/min — ABNORMAL LOW (ref >60)
Glucose, Bld: 158 mg/dL — ABNORMAL HIGH (ref 70–99)
Potassium: 4.8 mmol/L (ref 3.5–5.1)
Sodium: 130 mmol/L — ABNORMAL LOW (ref 135–145)

## 2022-02-03 LAB — HEPATITIS B SURFACE ANTIGEN: Hepatitis B Surface Ag: NONREACTIVE

## 2022-02-03 LAB — HEPATITIS B CORE ANTIBODY, TOTAL: Hep B Core Total Ab: NONREACTIVE

## 2022-02-03 MED ORDER — DOCUSATE SODIUM 100 MG PO CAPS: 200.0000 mg | ORAL_CAPSULE | Freq: Every day | ORAL | Status: DC | PRN

## 2022-02-03 MED ORDER — TRAMADOL HCL 50 MG PO TABS: 50.0000 mg | ORAL_TABLET | Freq: Two times a day (BID) | ORAL | Status: DC | PRN

## 2022-02-03 MED ORDER — HYDROXYZINE HCL 10 MG PO TABS
10.0000 mg | ORAL_TABLET | Freq: Four times a day (QID) | ORAL | Status: DC | PRN
  Administered 2022-02-03: 10 mg via ORAL
  Filled 2022-02-03 (×3): qty 1

## 2022-02-03 MED ORDER — PANTOPRAZOLE SODIUM 40 MG PO TBEC
40.0000 mg | DELAYED_RELEASE_TABLET | Freq: Every day | ORAL | Status: DC
  Filled 2022-02-03: qty 1

## 2022-02-03 MED ORDER — OXYCODONE HCL 5 MG PO TABS: 5.0000 mg | ORAL_TABLET | ORAL | Status: DC | PRN

## 2022-02-03 MED ORDER — ROSUVASTATIN CALCIUM 5 MG PO TABS
10.0000 mg | ORAL_TABLET | Freq: Every day | ORAL | Status: DC
  Administered 2022-02-03: 10 mg via ORAL
  Filled 2022-02-03: qty 2

## 2022-02-03 NOTE — Progress Notes (Addendum)
° ° Advanced Heart Failure Rounding Note ° °PCP-Cardiologist: Brian Crenshaw, MD  ° °Subjective:   ° °3/1: RHC with swan placement (on NE 2, EPi 3, Milrinone 0.25 and VP 0.02) w/ surprisingly well-compensated hemodynamics (see data below). Milrinone discontinued given low SVR °3/5: Empiric vancomycin/cefepime started for fever, temp 100, rising WBCs and PCT (1.05) ° °Remains off pressors.  LT IJ HD catheter placed yesterday. ° °Left sided weakness and right gaze preference noted during PT yesterday. Neuro consulted. Going for MRI today. ° °Slow to respond to questions. Denies CP or dyspnea.  ° ° °Objective:   °Weight Range: °72.5 kg °Body mass index is 31.74 kg/m².  ° °Vital Signs:   °Temp:  [97.4 °F (36.3 °C)-98.1 °F (36.7 °C)] 97.5 °F (36.4 °C) (03/10 1107) °Pulse Rate:  [71-79] 76 (03/10 1107) °Resp:  [5-29] 17 (03/10 1107) °BP: (83-107)/(47-81) 103/52 (03/10 1107) °SpO2:  [75 %-100 %] 93 % (03/10 1107) °Arterial Line BP: (101-107)/(50-57) 103/55 (03/09 1400) °Weight:  [72.5 kg] 72.5 kg (03/10 0518) °Last BM Date : 02/02/22 ° °Weight change: °Filed Weights  ° 02/01/22 1030 02/02/22 0429 02/03/22 0518  °Weight: 70.1 kg 69.8 kg 72.5 kg  ° ° °Intake/Output:  ° °Intake/Output Summary (Last 24 hours) at 02/03/2022 1108 °Last data filed at 02/03/2022 0719 °Gross per 24 hour  °Intake 1000 ml  °Output 0 ml  °Net 1000 ml  °  ° ° °Physical Exam  ° °General: No distress. Lying in bed. °HEENT: + cor-trak °Neck: supple. JVP 10-12. Carotids 2+ bilat; no bruits.  °Cor: PMI nondisplaced. Regular rate & rhythm. No rubs, gallops or murmurs. °Lungs: clear anteriorly °Abdomen: soft, nontender, nondistended. No hepatosplenomegaly. No bruits or masses. Good bowel sounds. °Extremities: no cyanosis, clubbing, rash, edema °Neuro: Alert. Slow to respond to questions.  ° ° ° °Telemetry  ° °SR 70s. Personally reviewed  ° °Labs  °  °CBC °Recent Labs  °  02/02/22 °0353 02/03/22 °0431  °WBC 14.6* 10.1  °NEUTROABS 9.3* 6.0  °HGB 8.2* 8.9*  °HCT  27.0* 28.7*  °MCV 101.1* 100.0  °PLT 210 223  ° °Basic Metabolic Panel °Recent Labs  °  02/01/22 °0344 02/02/22 °0353 02/03/22 °0431  °NA 135 135 130*  °K 4.5 4.1 4.8  °CL 102 100 96*  °CO2 23 24 23  °GLUCOSE 151* 153* 158*  °BUN 25* 25* 38*  °CREATININE 2.13* 2.41* 3.46*  °CALCIUM 9.7 10.1 10.1  °PHOS 2.9 2.8  --   ° °Liver Function Tests °Recent Labs  °  02/01/22 °0344 02/02/22 °0353  °ALBUMIN 2.3* 2.3*  ° °No results for input(s): LIPASE, AMYLASE in the last 72 hours. °Cardiac Enzymes °No results for input(s): CKTOTAL, CKMB, CKMBINDEX, TROPONINI in the last 72 hours. ° °BNP: °BNP (last 3 results) °Recent Labs  °  12/20/21 °2246 01/01/22 °0941  °BNP 1,753.1* 1,432.4*  ° ° °ProBNP (last 3 results) °No results for input(s): PROBNP in the last 8760 hours. ° ° °D-Dimer °No results for input(s): DDIMER in the last 72 hours. °Hemoglobin A1C °No results for input(s): HGBA1C in the last 72 hours. °Fasting Lipid Panel °No results for input(s): CHOL, HDL, LDLCALC, TRIG, CHOLHDL, LDLDIRECT in the last 72 hours. °Thyroid Function Tests °No results for input(s): TSH, T4TOTAL, T3FREE, THYROIDAB in the last 72 hours. ° °Invalid input(s): FREET3 ° °Other results: ° ° °Imaging  ° ° °CT HEAD WO CONTRAST (5MM) ° °Result Date: 02/02/2022 °CLINICAL DATA:  Mental status changes. EXAM: CT HEAD WITHOUT CONTRAST TECHNIQUE: Contiguous axial images   were obtained from the base of the skull through the vertex without intravenous contrast. RADIATION DOSE REDUCTION: This exam was performed according to the departmental dose-optimization program which includes automated exposure control, adjustment of the mA and/or kV according to patient size and/or use of iterative reconstruction technique. COMPARISON:  None. FINDINGS: Brain: Significant age advanced periventricular white matter disease progressive since 2017. Remote left frontal infarct with encephalomalacia. The ventricles are in the midline without mass effect or shift. No extra-axial fluid  collections are identified. No CT findings for acute hemispheric infarction or intracranial hemorrhage. Brainstem and cerebellum are grossly normal. Vascular: Age advanced vascular calcifications without definite aneurysm or hyperdense vessels. Skull: No skull fracture or bone lesions. Sinuses/Orbits: Extensive bilateral mastoid effusions. There is also fluid in the middle ear cavities. The paranasal sinuses are clear. Other: No scalp lesions or scalp hematoma. IMPRESSION: 1. Significant age advanced periventricular white matter disease progressive since 2017. 2. Remote left frontal infarct with encephalomalacia. 3. No acute intracranial findings or mass lesions. 4. Extensive bilateral mastoid effusions and fluid in the middle ear cavities. Electronically Signed   By: P.  Gallerani M.D.   On: 02/02/2022 18:12  ° °IR Fluoro Guide CV Line Left ° °Result Date: 02/02/2022 °INDICATION: End-stage renal disease, no current permanent dialysis access EXAM: ULTRASOUND GUIDANCE FOR VASCULAR ACCESS LEFT INTERNAL JUGULAR PERMANENT HEMODIALYSIS CATHETER Date:  02/02/2022 02/02/2022 1:02 pm Radiologist:  M. Trevor Shick, MD Guidance:  Ultrasound and fluoroscopic FLUOROSCOPY: Fluoroscopy Time: 2 minutes 6 seconds (13 mGy). MEDICATIONS: Patient is already receiving antibiotics as an inpatient ANESTHESIA/SEDATION: Versed 0.5 mg IV; Fentanyl 25 mcg IV; Moderate Sedation Time:  20 minutes The patient was continuously monitored during the procedure by the interventional radiology nurse under my direct supervision. CONTRAST:  None. COMPLICATIONS: None immediate. PROCEDURE: Informed consent was obtained from the patient following explanation of the procedure, risks, benefits and alternatives. The patient understands, agrees and consents for the procedure. All questions were addressed. A time out was performed. Maximal barrier sterile technique utilized including caps, mask, sterile gowns, sterile gloves, large sterile drape, hand hygiene, and  2% chlorhexidine scrub. Under sterile conditions and local anesthesia, left internal jugular micropuncture venous access was performed with ultrasound. Images were obtained for documentation of the patent left internal jugular vein. A guide wire was inserted followed by a transitional dilator. Next, a 0.035 guidewire was advanced into the IVC with a 5-French catheter. Measurements were obtained from the left venotomy site to the proximal right atrium. In the left infraclavicular chest, a subcutaneous tunnel was created under sterile conditions and local anesthesia. 1% lidocaine with epinephrine was utilized for this. The 23 cm tip to cuff palindrome catheter was tunneled subcutaneously to the venotomy site and inserted into the SVC/RA junction through a valved peel-away sheath. Position was confirmed with fluoroscopy. Images were obtained for documentation. Blood was aspirated from the catheter followed by saline and heparin flushes. The appropriate volume and strength of heparin was instilled in each lumen. Caps were applied. The catheter was secured at the tunnel site with Gelfoam and a pursestring suture. The venotomy site was closed with subcuticular Vicryl suture. Dermabond was applied to the small right neck incision. A dry sterile dressing was applied. The catheter is ready for use. No immediate complications. IMPRESSION: Ultrasound and fluoroscopically guided left internal jugular tunneled hemodialysis catheter (23 cm tip to cuff palindrome catheter). Electronically Signed   By: M.  Shick M.D.   On: 02/02/2022 13:10  ° °IR US Guide Vasc Access   Vasc Access Left  Result Date: 02/02/2022 INDICATION: End-stage renal disease, no current permanent dialysis access EXAM: ULTRASOUND GUIDANCE FOR VASCULAR ACCESS LEFT INTERNAL JUGULAR PERMANENT HEMODIALYSIS CATHETER Date:  02/02/2022 02/02/2022 1:02 pm Radiologist:  Jerilynn Mages. Daryll Brod, MD Guidance:  Ultrasound and fluoroscopic FLUOROSCOPY: Fluoroscopy Time: 2 minutes 6 seconds (13 mGy).  MEDICATIONS: Patient is already receiving antibiotics as an inpatient ANESTHESIA/SEDATION: Versed 0.5 mg IV; Fentanyl 25 mcg IV; Moderate Sedation Time:  20 minutes The patient was continuously monitored during the procedure by the interventional radiology nurse under my direct supervision. CONTRAST:  None. COMPLICATIONS: None immediate. PROCEDURE: Informed consent was obtained from the patient following explanation of the procedure, risks, benefits and alternatives. The patient understands, agrees and consents for the procedure. All questions were addressed. A time out was performed. Maximal barrier sterile technique utilized including caps, mask, sterile gowns, sterile gloves, large sterile drape, hand hygiene, and 2% chlorhexidine scrub. Under sterile conditions and local anesthesia, left internal jugular micropuncture venous access was performed with ultrasound. Images were obtained for documentation of the patent left internal jugular vein. A guide wire was inserted followed by a transitional dilator. Next, a 0.035 guidewire was advanced into the IVC with a 5-French catheter. Measurements were obtained from the left venotomy site to the proximal right atrium. In the left infraclavicular chest, a subcutaneous tunnel was created under sterile conditions and local anesthesia. 1% lidocaine with epinephrine was utilized for this. The 23 cm tip to cuff palindrome catheter was tunneled subcutaneously to the venotomy site and inserted into the SVC/RA junction through a valved peel-away sheath. Position was confirmed with fluoroscopy. Images were obtained for documentation. Blood was aspirated from the catheter followed by saline and heparin flushes. The appropriate volume and strength of heparin was instilled in each lumen. Caps were applied. The catheter was secured at the tunnel site with Gelfoam and a pursestring suture. The venotomy site was closed with subcuticular Vicryl suture. Dermabond was applied to the small  right neck incision. A dry sterile dressing was applied. The catheter is ready for use. No immediate complications. IMPRESSION: Ultrasound and fluoroscopically guided left internal jugular tunneled hemodialysis catheter (23 cm tip to cuff palindrome catheter). Electronically Signed   By: Jerilynn Mages.  Shick M.D.   On: 02/02/2022 13:10   DG Chest Port 1 View  Result Date: 02/03/2022 CLINICAL DATA:  Postop CABG EXAM: PORTABLE CHEST 1 VIEW COMPARISON:  Chest radiograph 01/28/2022 FINDINGS: Median sternotomy wires and mediastinal surgical clips are again seen. The enteric catheter courses below the diaphragm and off the field of view. There is a new tunneled left-sided vascular catheter terminating in the right atrium. An aortic valve prosthesis is unchanged. The inferior approach pulmonary artery catheter has been removed. The heart is enlarged, unchanged. The upper mediastinal contours are stable. There is a small left pleural effusion with worsening aeration in the left base. The left upper lobe remains aerated. The right lung is clear. There is no significant right effusion. There is no appreciable pneumothorax. There is no acute osseous abnormality. IMPRESSION: Small left pleural effusion with left basilar opacity which may reflect atelectasis or infection, new/worsened since 01/28/2022. Electronically Signed   By: Valetta Mole M.D.   On: 02/03/2022 08:17     Medications:     Scheduled Medications:  amiodarone  200 mg Oral BID   apixaban  5 mg Oral BID   aspirin EC  325 mg Oral Daily   Or   aspirin  324 mg Per Tube Daily  gluconate (MEDLINE KIT)  15 mL Mouth Rinse BID  ° Chlorhexidine Gluconate Cloth  6 each Topical Daily  ° darbepoetin (ARANESP) injection - NON-DIALYSIS  100 mcg Subcutaneous Q Sat-1800  ° doxercalciferol  9 mcg Intravenous Q M,W,F-HD  ° ezetimibe  10 mg Oral Daily  ° feeding supplement (PROSource TF)  45 mL Per Tube BID  ° feeding supplement (VITAL 1.5 CAL)  600 mL Per Tube  Q24H  ° insulin aspart  0-20 Units Subcutaneous Q4H  ° insulin detemir  12 Units Subcutaneous BID  ° latanoprost  1 drop Both Eyes QHS  ° midodrine  15 mg Oral Q6H  ° multivitamin  1 tablet Per Tube QHS  ° pantoprazole sodium  40 mg Per Tube Daily  ° rosuvastatin  10 mg Per Tube QHS  ° sodium chloride flush  10-40 mL Intracatheter Q12H  ° sodium chloride flush  3 mL Intravenous Q12H  ° vancomycin variable dose per unstable renal function (pharmacist dosing)   Does not apply See admin instructions  ° ° °Infusions: ° sodium chloride    ° sodium chloride Stopped (02/01/22 2059)  ° sodium chloride 10 mL/hr at 01/31/22 2057  ° sodium chloride Stopped (01/31/22 2057)  ° ceFEPime (MAXIPIME) IV Stopped (02/02/22 2302)  ° ° °PRN Medications: °sodium chloride, sodium chloride, bisacodyl **OR** bisacodyl, docusate, food thickener, Gerhardt's butt cream, hydrOXYzine, lip balm, ondansetron (ZOFRAN) IV, oxyCODONE, sodium chloride flush, sodium chloride flush, traMADol ° ° ° °Patient Profile  ° °65 y/o female with obesity, T2DM, previous PE, ESRD and mitral regurgitation, 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 Subsequently followed at [facility] for mixed PH on macitentan and sildenafil. Admitted 2/5 for CP. LHC showed severe 3v CAD with CTO of RCA & LCX and critical ostial LAD lesion. Initial echo, EF 40-45%, no MR, mod MS, RV mildly reduced w/ severe TR. Underwent CABG w/ PFO closure and tricuspid valve repair/valvuloplasty w/ ring 01/14/2022. Complicated post CABG course with RV failure, GN bacteremia and persistent hypotension and inability to wean off pressors. Nephrology following for CVVH. ° °Assessment/Plan  ° °1. Post-cardiotomy cardiogenic shock °- likely due predominantly to end-stage RV failure. (Required ECMO in 2016 post MVR). Echo 01/15/22 with EF 50-55%, D-shaped septum with severe RV dysfunction and enlargement, s/p MV repair, s/p TV repair but still with severe TR.  °- Post op platelets down to 30. Switched  to Bival. HIT negative. Plts improved  °- RHC 3/1 revealed surprisingly well-compensated hemodynamics on 4 vasopressors/inotropes. CO/CI 6.4/3.8, RA 11, PCW 17, PAPi 2.45  °- Milrinone stopped 3/1 w/ low SVR (650)  °- Now off IV pressor support. SBPs 90-low 100s, on Midodrine 15 q6h. No change °- Tolerated iHD 3/8.  LT IJ TDC placed yesterday. HD today. °  °2. PAH with cor-pulmonale, end-stage °- she clearly has mixed PH (predominantly WHO Groups II-III but perhaps contributions from I & IV) °- Stop macitentan. Given her hemodynamics  doubt she will have much to gain from further selective pulmonary artery vasodilators. PVR not elevated.   ° °3. Severe 3vCAD s/p CABG x3 with closure PFO + TV ring on 01/21/2022 °- No s/s angina °- continue ASA/Statin °  °4. ESRD °- currently tolerating iHD. °- Would not push volume removal further given RV failure °- LT IJ TDC cath placed yesterday. Going for HD today. °  °5. SVT/possible PAF °- maintaining NSR °- now off pressors, continue PO amio 200 mg bid  °- plts recovered. HIT negative.  °-   On Eliquis °  °6. H/O PE °- 11/2020 VQ : unchanged left lower lobe apical segment PE °- VQ 01/02/22 low prob °- On Eliquis °  °7. H/O MV Ring 2016 °- stable on echo  ° °8. Anemia  °- Hgb stable 8.9 °- transfuse for hgb < 7.0  °- iron stores normal  ° °9. ID °- morganella morganii bacteremia: completed abx. BCx 2/18 cleared  °- abx reinitiated 3/5 for fever, worsening leukocytosis and PCT 1.05  °- AF today, WBC down trending  °- treating w/ empiric vancomycin/cefepime x 7 days (end-date 3/12)  ° °10. Severe TR despite TV repair ° °11. Possible CVA °-Intermittent delirium °-Left-sided weakness and right gaze preference noted in PT yesterday °-CT head with remote frontal infarct and encephelomalacia °-Neuro consulted. MRI pending. ° °12. Hyponatremia °-Na 135>130 °-Volume management with HD °-Follow ° ° ° °FINCH, LINDSAY N, PA-C  °11:08 AM ° °Patient seen and examined with the above-signed Advanced  Practice Provider and/or Housestaff. I personally reviewed laboratory data, imaging studies and relevant notes. I independently examined the patient and formulated the important aspects of the plan. I have edited the note to reflect any of my changes or salient points. I have personally discussed the plan with the patient and/or family. ° °Denies CP or SOB. Weight up today but has HD planned. Concern for left-sided weakness and gaze preference today.  ° °General:  Weak appearing. No resp difficulty °HEENT: normal °Neck: supple. LIJ cath  Carotids 2+ bilat; no bruits. No lymphadenopathy or thryomegaly appreciated. °Cor: PMI nondisplaced. Regular rate & rhythm. 2/6 TR °Lungs: clear °Abdomen: obese soft, nontender, nondistended. No hepatosplenomegaly. No bruits or masses. Good bowel sounds. °Extremities: no cyanosis, clubbing, rash, edema °Neuro: alert & orientedx3, cranial nerves grossly intact. moves all 4 extremities w/o difficulty. Affect pleasant ° °I think this is as good as we can get her from a cardiac perspective. Tolerating HD with high-dose midodrine. Would continue. No s/s angina.  ° °AHF team will follow at a distance. Please call with questions.  ° °Daniel Bensimhon, MD  °7:19 PM ° ° ° ° ° °

## 2022-02-03 NOTE — Progress Notes (Addendum)
[address]       [employer] [id number]             [phone]      9 Days Post-Op Procedure(s) (LRB): RIGHT HEART CATH (N/A) Subjective: Feels fair  Objective: Vital signs in last 24 hours: Temp:  [97.4 F (36.3 C)] 97.4 F (36.3 C) (03/10 0358) Pulse Rate:  [71-79] 76 (03/10 0358) Cardiac Rhythm: Normal sinus rhythm (03/09 2040) Resp:  [5-29] 17 (03/10 0358) BP: (83-107)/(47-81) 95/47 (03/10 0358) SpO2:  [75 %-100 %] 100 % (03/10 0358) Arterial Line BP: (101-107)/(50-57) 103/55 (03/09 1400) Weight:  [72.5 kg] 72.5 kg (03/10 0518)  Hemodynamic parameters for last 24 hours: CVP:  [0 mmHg-18 mmHg] 0 mmHg CO:  [0 L/min] 0 L/min  Intake/Output from previous day: 03/09 0701 - 03/10 0700 In: 880 [NG/GT:780; IV Piggyback:100] Out: 0  Intake/Output this shift: Total I/O In: 120 [NG/GT:120] Out: -   General appearance: distracted and fatigued Heart: regular rate and rhythm Lungs: fairly clear anteriorly Abdomen: benign Extremities: no edema/calf tenderness Wound: healing well  Lab Results: Recent Labs    02/02/22 0353 02/03/22 0431  WBC 14.6* 10.1  HGB 8.2* 8.9*  HCT 27.0* 28.7*  PLT 210 223   BMET:  Recent Labs    02/02/22 0353 02/03/22 0431  NA 135 130*  K 4.1 4.8  CL 100 96*  CO2 24 23  GLUCOSE 153* 158*  BUN 25* 38*  CREATININE 2.41* 3.46*  CALCIUM 10.1 10.1    PT/INR: No results for input(s): LABPROT, INR in the last 72 hours. ABG    Component Value Date/Time   PHART 7.402 01/30/2022 2208   HCO3 27.0 01/30/2022 2208   TCO2 28 01/30/2022 2208   ACIDBASEDEF 1.0 01/20/2022 1944   O2SAT 99 01/30/2022 2208   CBG (last 3)  Recent Labs    02/03/22 0021 02/03/22 0429 02/03/22 0809  GLUCAP 119* 160* 151*    Meds Scheduled Meds:  amiodarone  200 mg Oral BID   apixaban  5 mg Oral BID   aspirin EC  325 mg Oral Daily   Or   aspirin  324 mg Per Tube Daily   chlorhexidine gluconate (MEDLINE KIT)  15 mL Mouth Rinse BID    Chlorhexidine Gluconate Cloth  6 each Topical Daily   darbepoetin (ARANESP) injection - NON-DIALYSIS  100 mcg Subcutaneous Q Sat-1800   doxercalciferol  9 mcg Intravenous Q M,W,F-HD   ezetimibe  10 mg Oral Daily   feeding supplement (PROSource TF)  45 mL Per Tube BID   feeding supplement (VITAL 1.5 CAL)  600 mL Per Tube Q24H   insulin aspart  0-20 Units Subcutaneous Q4H   insulin detemir  12 Units Subcutaneous BID   latanoprost  1 drop Both Eyes QHS   midodrine  15 mg Oral Q6H   multivitamin  1 tablet Per Tube QHS   pantoprazole sodium  40 mg Per Tube Daily   rosuvastatin  10 mg Per Tube QHS   sodium chloride flush  10-40 mL Intracatheter Q12H   sodium chloride flush  3 mL Intravenous Q12H   vancomycin variable dose per unstable renal function (pharmacist dosing)   Does not apply See admin instructions   Continuous Infusions:  sodium chloride     sodium chloride Stopped (02/01/22 2059)   sodium chloride 10 mL/hr at 01/31/22 2057   sodium chloride Stopped (01/31/22 2057)   ceFEPime (MAXIPIME) IV Stopped (02/02/22 2302)  PRN Meds:.sodium chloride, sodium chloride, bisacodyl **OR** bisacodyl, docusate, food thickener, Gerhardt's butt cream, hydrOXYzine, lip balm, ondansetron (ZOFRAN) IV, oxyCODONE, sodium chloride flush, sodium chloride flush, traMADol  Xrays CT HEAD WO CONTRAST (5MM)  Result Date: 02/02/2022 CLINICAL DATA:  Mental status changes. EXAM: CT HEAD WITHOUT CONTRAST TECHNIQUE: Contiguous axial images were obtained from the base of the skull through the vertex without intravenous contrast. RADIATION DOSE REDUCTION: This exam was performed according to the departmental dose-optimization program which includes automated exposure control, adjustment of the mA and/or kV according to patient size and/or use of iterative reconstruction technique. COMPARISON:  None. FINDINGS: Brain: Significant age advanced periventricular white matter disease progressive since 2017. Remote left  frontal infarct with encephalomalacia. The ventricles are in the midline without mass effect or shift. No extra-axial fluid collections are identified. No CT findings for acute hemispheric infarction or intracranial hemorrhage. Brainstem and cerebellum are grossly normal. Vascular: Age advanced vascular calcifications without definite aneurysm or hyperdense vessels. Skull: No skull fracture or bone lesions. Sinuses/Orbits: Extensive bilateral mastoid effusions. There is also fluid in the middle ear cavities. The paranasal sinuses are clear. Other: No scalp lesions or scalp hematoma. IMPRESSION: 1. Significant age advanced periventricular white matter disease progressive since 2017. 2. Remote left frontal infarct with encephalomalacia. 3. No acute intracranial findings or mass lesions. 4. Extensive bilateral mastoid effusions and fluid in the middle ear cavities. Electronically Signed   By: Marijo Sanes M.D.   On: 02/02/2022 18:12   IR Fluoro Guide CV Line Left  Result Date: 02/02/2022 INDICATION: End-stage renal disease, no current permanent dialysis access EXAM: ULTRASOUND GUIDANCE FOR VASCULAR ACCESS LEFT INTERNAL JUGULAR PERMANENT HEMODIALYSIS CATHETER Date:  02/02/2022 02/02/2022 1:02 pm Radiologist:  Jerilynn Mages. Daryll Brod, MD Guidance:  Ultrasound and fluoroscopic FLUOROSCOPY: Fluoroscopy Time: 2 minutes 6 seconds (13 mGy). MEDICATIONS: Patient is already receiving antibiotics as an inpatient ANESTHESIA/SEDATION: Versed 0.5 mg IV; Fentanyl 25 mcg IV; Moderate Sedation Time:  20 minutes The patient was continuously monitored during the procedure by the interventional radiology nurse under my direct supervision. CONTRAST:  None. COMPLICATIONS: None immediate. PROCEDURE: Informed consent was obtained from the patient following explanation of the procedure, risks, benefits and alternatives. The patient understands, agrees and consents for the procedure. All questions were addressed. A time out was performed. Maximal  barrier sterile technique utilized including caps, mask, sterile gowns, sterile gloves, large sterile drape, hand hygiene, and 2% chlorhexidine scrub. Under sterile conditions and local anesthesia, left internal jugular micropuncture venous access was performed with ultrasound. Images were obtained for documentation of the patent left internal jugular vein. A guide wire was inserted followed by a transitional dilator. Next, a 0.035 guidewire was advanced into the IVC with a 5-French catheter. Measurements were obtained from the left venotomy site to the proximal right atrium. In the left infraclavicular chest, a subcutaneous tunnel was created under sterile conditions and local anesthesia. 1% lidocaine with epinephrine was utilized for this. The 23 cm tip to cuff palindrome catheter was tunneled subcutaneously to the venotomy site and inserted into the SVC/RA junction through a valved peel-away sheath. Position was confirmed with fluoroscopy. Images were obtained for documentation. Blood was aspirated from the catheter followed by saline and heparin flushes. The appropriate volume and strength of heparin was instilled in each lumen. Caps were applied. The catheter was secured at the tunnel site with Gelfoam and a pursestring suture. The venotomy site was closed with subcuticular Vicryl suture. Dermabond was applied to the small right neck incision.  A dry sterile dressing was applied. The catheter is ready for use. No immediate complications. IMPRESSION: Ultrasound and fluoroscopically guided left internal jugular tunneled hemodialysis catheter (23 cm tip to cuff palindrome catheter). Electronically Signed   By: Jerilynn Mages.  Shick M.D.   On: 02/02/2022 13:10   IR US Guide Vasc Access Left  Result Date: 02/02/2022 INDICATION: End-stage renal disease, no current permanent dialysis access EXAM: ULTRASOUND GUIDANCE FOR VASCULAR ACCESS LEFT INTERNAL JUGULAR PERMANENT HEMODIALYSIS CATHETER Date:  02/02/2022 02/02/2022 1:02 pm  Radiologist:  Jerilynn Mages. Daryll Brod, MD Guidance:  Ultrasound and fluoroscopic FLUOROSCOPY: Fluoroscopy Time: 2 minutes 6 seconds (13 mGy). MEDICATIONS: Patient is already receiving antibiotics as an inpatient ANESTHESIA/SEDATION: Versed 0.5 mg IV; Fentanyl 25 mcg IV; Moderate Sedation Time:  20 minutes The patient was continuously monitored during the procedure by the interventional radiology nurse under my direct supervision. CONTRAST:  None. COMPLICATIONS: None immediate. PROCEDURE: Informed consent was obtained from the patient following explanation of the procedure, risks, benefits and alternatives. The patient understands, agrees and consents for the procedure. All questions were addressed. A time out was performed. Maximal barrier sterile technique utilized including caps, mask, sterile gowns, sterile gloves, large sterile drape, hand hygiene, and 2% chlorhexidine scrub. Under sterile conditions and local anesthesia, left internal jugular micropuncture venous access was performed with ultrasound. Images were obtained for documentation of the patent left internal jugular vein. A guide wire was inserted followed by a transitional dilator. Next, a 0.035 guidewire was advanced into the IVC with a 5-French catheter. Measurements were obtained from the left venotomy site to the proximal right atrium. In the left infraclavicular chest, a subcutaneous tunnel was created under sterile conditions and local anesthesia. 1% lidocaine with epinephrine was utilized for this. The 23 cm tip to cuff palindrome catheter was tunneled subcutaneously to the venotomy site and inserted into the SVC/RA junction through a valved peel-away sheath. Position was confirmed with fluoroscopy. Images were obtained for documentation. Blood was aspirated from the catheter followed by saline and heparin flushes. The appropriate volume and strength of heparin was instilled in each lumen. Caps were applied. The catheter was secured at the tunnel site  with Gelfoam and a pursestring suture. The venotomy site was closed with subcuticular Vicryl suture. Dermabond was applied to the small right neck incision. A dry sterile dressing was applied. The catheter is ready for use. No immediate complications. IMPRESSION: Ultrasound and fluoroscopically guided left internal jugular tunneled hemodialysis catheter (23 cm tip to cuff palindrome catheter). Electronically Signed   By: Jerilynn Mages.  Shick M.D.   On: 02/02/2022 13:10    Assessment/Plan: S/P Procedure(s) (LRB): RIGHT HEART CATH (N/A)  1 afeb, VSS, sinus rhythm, S BP 83-107, SR, on amio and eliquis 2 sats good on 4 liters 3 no UOP- HD/renal care per nephrology 4 poss CVA/delerium- neuro has been consulted- PT/OT on board, recs for CIR 5 minor hyponatremia- monitor 6 leukocytosis resolved- on maxepime 7 on TF's, slowly increasing oral intake 8 BScontrol is reasonable 9 H/H pretty stable 10 HF/PCCM consulting 11 Hospitalist managing primarily  LOS: 33 days    [name] PA-C Pager [phone] 02/03/2022   Patient seen and examined, agree with above  Remo Lipps C. Roxan Hockey, MD [facility] [phone]

## 2022-02-03 NOTE — Progress Notes (Addendum)
PCCM progress Note  ? ?As per my note yesterday afternoon there was a concern for a neuro deficit but given that she recently had an IR procedure with sedation my neuro exam was limited and per notes she has had intermittent delirium. Later in the afternoon PT worked with her and noticed less left sided weakness with right gaze preference different from her prior PT evaluations. This prompted further neurologically evaluation to include a head CT. This reveled a remote left frontal infarct with encephalomalacia. ? ?Neurology consulted and reccommended MRI to further assess possible stroke area  ? ?Zafiro Routson D. Harris, NP-C ?[facility] ?Personal contact information can be found on Amion  ?02/03/2022, 7:13 AM ? ? ?

## 2022-02-03 NOTE — Assessment & Plan Note (Addendum)
Continue antibiotic therapy with cefepime and vancomycin. ?Blood culture for Morganella Morganii  ?Sensitive to ceftazidime, zosyn and ciprofloxacin.  ?Follow up blood cultures with no growth.  ? ?Patient completed antibiotic therapy on 03/05 7 days, but resumed antibiotic therapy with vancomycin and cefepime due to recurrent fever.  ?Patient with loose/ formed stool, she has a rectal tune in place.  ?She has been afebrile and now with rectal bleeding.  ? ?Plan to stop antibiotic therapy and check C diff.  ?

## 2022-02-03 NOTE — Progress Notes (Signed)
Physical Therapy Treatment ?Patient Details ?Name: Jody Nguyen ?MRN: [id number] ?DOB: 26-Jan-1956 ?Today's Date: 02/03/2022 ? ? ?History of Present Illness 66 yo female presenting 2/5 with recurrent chest pain. Pt now s/p CABG x3 and tricuspid valve annuloplasty on 2/10 requiring 6 units PRBCs, 2 units FFP, and 2 bags of platelets and resulting in vasoplegic shock with very high vasopressor requirements. Started on CRRT 2/11, extubated 2/16. S/p right heart cath 3/1 and Swan-ganz placed. Off CRRT 3/7. S/p L IJ HD catheter placement 3/9. PMH includes: anemia, CHF, CKD IV on HD MWF, CVA, DM II, HLD, HTN, pulmonary HTN. ? ?  ?PT Comments  ? ? Pt received in supine, alert and participatory, pt mostly non-verbal and responds at times to yes/no questions with head nod, pt nods yes when asked, "Is it difficult to get the words out?". Pt following ~75% of simple 1-step multimodal cues for mobility tasks during bed mobility and UE/LE exercises, but needs increased time to respond. Pt needing mod to maxA for long sitting in bed and raising trunk fully upright but is able to roll with max cues and minA to L side. Emphasis on pressure relief/edema reduction techniques (noted RUE edema especially in hand, RUE elevated), UE/LE therapeutic exercises and attention to L side. Pt continues to benefit from PT services to progress toward functional mobility goals. Plan to continue standing trials within Kreg bed and weight shifting as able next session, today's session limited due to arrival of transport for HD dept.  ?Recommendations for follow up therapy are one component of a multi-disciplinary discharge planning process, led by the attending physician.  Recommendations may be updated based on patient status, additional functional criteria and insurance authorization. ? ?Follow Up Recommendations ? Acute inpatient rehab (3hours/day) ?  ?  ?Assistance Recommended at Discharge Frequent or constant Supervision/Assistance  ?Patient can  return home with the following Two people to help with walking and/or transfers;Two people to help with bathing/dressing/bathroom;Assistance with cooking/housework;Help with stairs or ramp for entrance;Direct supervision/assist for medications management;Direct supervision/assist for financial management;Assist for transportation;Assistance with feeding ?  ?Equipment Recommendations ? Other (comment) (TBD post-acute)  ?  ?Recommendations for Other Services Rehab consult ? ? ?  ?Precautions / Restrictions Precautions ?Precautions: Fall;Sternal;Other (comment) ?Precaution Booklet Issued: No ?Precaution Comments: flexiseal, cortrak, aphasia ?Restrictions ?Weight Bearing Restrictions: Yes ?Other Position/Activity Restrictions: sternal precautions  ?  ? ?Mobility ? Bed Mobility ?Overal bed mobility: Needs Assistance ?Bed Mobility: Supine to Sit, Rolling ?Rolling: Min assist ?  ?Supine to sit: Mod assist, HOB elevated ?  ?  ?General bed mobility comments: rolling to L side for repositioning/pressure relief and lifting trunk from supine to long sit x3 reps with support at back and cues for precautions, pt unable to maintain long sit in bed >10-20 seconds prior to leaning back to rest ?  ? ?Transfers ?  ?  ?  ?  ?  ?  ?  ?  ?  ?General transfer comment: Defer, transport arrival for HD prior to transition to EOB ?  ? ?Ambulation/Gait ?  ?  ?  ?  ?  ?  ?  ?General Gait Details: Unable ? ? ?  ?Balance Overall balance assessment: Needs assistance ?Sitting-balance support: Bilateral upper extremity supported, Feet supported, Single extremity supported, No upper extremity supported ?Sitting balance-Leahy Scale: Poor ?Sitting balance - Comments: Pt with consistent L lateral lean while bed in chair position, pt able to correct toward R side with postural cues. Unable to maintain long sit without  mod/maxA trunk support ?Postural control: Left lateral lean ?  ?  ?Standing balance comment: UTA, pt had to leave for HD ?  ?  ?  ?  ?  ?   ?  ?  ?  ?  ?  ?  ? ?  ?Cognition Arousal/Alertness: Awake/alert ?Behavior During Therapy: Flat affect ?Overall Cognitive Status: Impaired/Different from baseline ?Area of Impairment: Attention, Memory, Following commands, Safety/judgement, Problem solving, Awareness, Orientation ?  ?  ?  ?  ?  ?  ?  ?  ?Orientation Level:  (pt not answering orientation questions) ?Current Attention Level: Focused ?Memory: Decreased short-term memory, Decreased recall of precautions ?Following Commands: Follows one step commands inconsistently, Follows one step commands with increased time ?Safety/Judgement: Decreased awareness of safety, Decreased awareness of deficits ?Awareness: Intellectual ?Problem Solving: Slow processing, Decreased initiation, Difficulty sequencing, Requires verbal cues, Requires tactile cues ?General Comments: Upon arrival, pt supine (HOB elevated) with eyes open, pt not answering most questions, able to respond to some simple 1-step cues and nod head yes/no in response to some questions. Pt only verbalized one time to say "yes". ?  ?  ? ?  ?Exercises General Exercises - Upper Extremity ?Shoulder Flexion: AAROM, Both, 10 reps, Supine ?Elbow Flexion: AROM, AAROM, Both, 10 reps, Other (comment) (bed in chair posture) ?Elbow Extension: AROM, AAROM, Both, 10 reps (bed in chair posture) ?General Exercises - Lower Extremity ?Ankle Circles/Pumps: AROM, Both, 15 reps ?Short Arc Quad: AROM, Both, 15 reps (bed in chair posture) ?Heel Slides: AROM, Both, 10 reps, Supine, AAROM ?Hip ABduction/ADduction: AAROM, Both, 5 reps, Supine ?Other Exercises ?Other Exercises: 40* supine to long sit in bed x3 trials and maintaining upright with BUE support of side rail, emphasis on core activation ?Other Exercises: lateral leans with bed in chair posture to L/R sides x5 reps with elbow taps ? ?  ?General Comments General comments (skin integrity, edema, etc.): SpO2 86-94% on 4.5L O2 [place], pt mostly >90% however with repositioning  occasional desat to 86-87%, with multimodal cues for pursed-lip breathing, SpO2 improves >90%. Seems to do better with HOB elevated. ?  ?  ? ?Pertinent Vitals/Pain Pain Assessment ?Pain Assessment: Faces ?Faces Pain Scale: No hurt ?Pain Intervention(s): Monitored during session, Repositioned (pt without grimacing, pt shakes head no)  ? ? ?   ?   ? ?PT Goals (current goals can now be found in the care plan section) Acute Rehab PT Goals ?PT Goal Formulation: With patient/family ?Time For Goal Achievement: 02/09/22 ?Progress towards PT goals: Progressing toward goals ? ?  ?Frequency ? ? ? Min 3X/week ? ? ? ?  ?PT Plan Frequency needs to be updated  ? ? ?   ?AM-PAC PT "6 Clicks" Mobility   ?Outcome Measure ? Help needed turning from your back to your side while in a flat bed without using bedrails?: A Lot ?Help needed moving from lying on your back to sitting on the side of a flat bed without using bedrails?: Total ?Help needed moving to and from a bed to a chair (including a wheelchair)?: Total ?Help needed standing up from a chair using your arms (e.g., wheelchair or bedside chair)?: Total ?Help needed to walk in hospital room?: Total ?Help needed climbing 3-5 steps with a railing? : Total ?6 Click Score: 7 ? ?  ?End of Session Equipment Utilized During Treatment: Oxygen ?Activity Tolerance: Patient limited by fatigue ?Patient left: in bed;with call bell/phone within reach;Other (comment) (in care of transport tech to leave for HD) ?Nurse  Communication: Mobility status;Other (comment) (pt increased aphasia compared with previous date) ?PT Visit Diagnosis: Unsteadiness on feet (R26.81);Other abnormalities of gait and mobility (R26.89);Muscle weakness (generalized) (M62.81);Difficulty in walking, not elsewhere classified (R26.2) ?  ? ? ?Time: 1333-1401 ?PT Time Calculation (min) (ACUTE ONLY): 28 min ? ?Charges:  $Therapeutic Exercise: 8-22 mins ?$Neuromuscular Re-education: 8-22 mins          ?          ? ?Antoinett Dorman P.,  PTA ?Acute Rehabilitation Services ?Pager: [phone] ?Office: [phone]  ? ? ?Kara Pacer Sharrie Self ?02/03/2022, 2:29 PM ? ?

## 2022-02-03 NOTE — Assessment & Plan Note (Addendum)
Patient had transitory SVT during her hospitalization.  ? ?Continue rate control with amiodarone. ?Continue telemetry monitoring. ?Because of bleeding will continue holding anticoagulation.  ?

## 2022-02-03 NOTE — Progress Notes (Signed)
Pharmacy Antibiotic Note ? ?GRABIELA WOHLFORD is a 66 y.o. female admitted on 01/21/2022 with Morganella bacteremia and treated with Zosyn - completed 2/26. Vancomycin and cefepime started for increased WBC count on 3/5. Pt has been maintained on CRRT, and transition to iHD on 3/8.  ?-VR 2 hours post-HD was 21 on 3/8 ?-HD done today at [facility] off 400 for total duration of about 2 hours; estimate vancomycin level of about 16 ? ?Plan: ?Continue cefepime to 1g IV q24h (HD dosing) ?No additional vancomycin dose needed today ?Antibiotics to stop on 3/11 ? ? ?Height: 4' 11.5" (151.1 cm) ?Weight:  (Weight not taken, with too many attachment.) ?IBW/kg (Calculated) : 44.35 ? ?Temp (24hrs), Avg:97.6 ?F (36.4 ?C), Min:97.3 ?F (36.3 ?C), Max:98.1 ?F (36.7 ?C) ? ?Recent Labs  ?Lab 01/30/22 ?0345 01/30/22 ?1750 01/31/22 ?0439 02/01/22 ?9833 02/01/22 ?1345 02/02/22 ?0353 02/03/22 ?0431  ?WBC 19.8*  --  15.8* 15.6*  --  14.6* 10.1  ?CREATININE 0.87 0.95 1.11* 2.13*  --  2.41* 3.46*  ?VANCORANDOM  --   --   --   --  21  --   --   ? ?  ?Estimated Creatinine Clearance: 14 mL/min (A) (by C-G formula based on SCr of 3.46 mg/dL (H)).   ? ?Allergies  ?Allergen Reactions  ? Minoxidil Other (See Comments)  ?  Pericardial effusion  ? Lipitor [Atorvastatin] Other (See Comments)  ?  MYALGIAS > "pain in legs" ?Tolerates rosuvastatin  ? Morphine And Related Itching  ? Ace Inhibitors Other (See Comments)  ?  REACTION: "not sure...think it made me drowsy all the time"  ? ?Vancomycin 2/17 > 2/18  resume 3/5>(3/11) ?Cefepime 3/5. (3/11) ?Zosyn 2/17 > 2/26 ? ?2/17 BCx x 2 > morganella - S to zosyn ?2/17 MRSA PCR neg ? ?Hildred Laser, PharmD ?Clinical Pharmacist ?**Pharmacist phone directory can now be found on [email or web address] ([email or web address]).  Listed under [place]. ? ? ? ?

## 2022-02-03 NOTE — Assessment & Plan Note (Signed)
Clinically improved, continue oxymetry monitoring and supplemental 02 per [place]  ?

## 2022-02-03 NOTE — Progress Notes (Signed)
?[facility] ?Progress Note  ? ?Assessment/ Plan:   ?OP HD: MWF [facility] ?3:15h   450/A1.5   75.5kg   2K/2Ca bath  P2   AVF  Hep  2500 ? - COVID neg 2/04, hep B SAg neg on 01/03/22 ?- Hectoral 31mcg IV q HD ?- Sensipar $RemoveBef'90mg'PviRuCdOUC$  PO q HD ?- Mircera 155mcg IV q 2 weeks (ordered, not yet given. Last Mircera 100 on 12/07/21) ?  ?  ?  ?Assessment/Plan:  ? CAD s/p CABG x3, TR sp TVR:  per CT surgery  ? ESRD:  normally MWF schedule. SP CRRT 2/07- 3/07. Had iHD on 3/08. Next HD today upstairs.  ?Shock - cardiogenic and septic w/ RV failure. RHC 02/07/2022-->  well-compensated hemodynamics. Continues on midodrine tid. Off all pressors and inotropes.   ? Volume - no need to push vol down now per cardiology. Keep even on HD today.  ?Morganella bacteremia -  s/p Zosyn ? Anemia CKD: Sq darbe 100ug weekly on Saturday. 1st 2/25. ? Metabolic bone disease: resume home binders and sensipar when taking PO. Cont IV vdra.  ? Infiltrated AVF - AVF infiltrated on 2/7. Post-op AVF is nonfunctional, VVS aware. Temp cath removed and is SP new TDC LIJ per IR on 2/09.  ?Pulm HTN:  Followed at [facility], holding macitenan (opsumit) ? T2DM-per primary service ? Hx of MV replacement - in 2016 ?Dispo: in ICU ? ?Kelly Splinter, MD ?02/03/2022, 11:04 AM ? ? ? ? ?Subjective:   ? ?Seen in room, no c/o  ? ?Objective:   ?BP 101/80 (BP Location: Left Wrist)   Pulse 78   Temp 98.1 ?F (36.7 ?C) (Oral)   Resp 18   Ht 4' 11.5" (1.511 m)   Wt 72.5 kg   SpO2 100%   BMI 31.74 kg/m?  ? ?Physical Exam: ?VOJ:JKKXF, cortrak in place, eyes open, nad ?CVS: reg RR ?Resp: coarse ?Abd: soft obese nontender ?Ext: 1+ RUE edema, no other edema ?ACCESS: nontunneled HD cath R fem ? ?Labs: ?BMET ?Recent Labs  ?Lab 01/29/22 ?0345 01/29/22 ?1518 01/30/22 ?0345 01/30/22 ?1750 01/30/22 ?2208 01/31/22 ?8182 02/01/22 ?9937 02/02/22 ?1696 02/03/22 ?0431  ?NA 135 135 135 136 137 137 135 135 130*  ?K 4.7 4.2 4.2 4.0 4.3 4.1 4.5 4.1 4.8  ?CL 102 102 104 104  --  104 102 100 96*  ?CO2 $Rem'22  23 22 25  'flIy$ --  $R'24 23 24 23  'CR$ ?GLUCOSE 240* 148* 131* 95  --  155* 151* 153* 158*  ?BUN $Rem'11 11 11 11  'Emlg$ --  12 25* 25* 38*  ?CREATININE 1.12* 0.97 0.87 0.95  --  1.11* 2.13* 2.41* 3.46*  ?CALCIUM 9.7 9.2 9.4 9.5  --  9.4 9.7 10.1 10.1  ?PHOS 1.7* 3.8 2.3* 2.0*  --  1.8* 2.9 2.8  --   ? ? ?CBC ?Recent Labs  ?Lab 01/31/22 ?0439 02/01/22 ?7893 02/02/22 ?8101 02/03/22 ?0431  ?WBC 15.8* 15.6* 14.6* 10.1  ?NEUTROABS 10.4* 9.9* 9.3* 6.0  ?HGB 8.0* 7.9* 8.2* 8.9*  ?HCT 25.9* 26.4* 27.0* 28.7*  ?MCV 101.2* 102.3* 101.1* 100.0  ?PLT 185 199 210 223  ? ? ? ?  ?Medications:   ? ? amiodarone  200 mg Oral BID  ? apixaban  5 mg Oral BID  ? aspirin EC  325 mg Oral Daily  ? Or  ? aspirin  324 mg Per Tube Daily  ? chlorhexidine gluconate (MEDLINE KIT)  15 mL Mouth Rinse BID  ? Chlorhexidine Gluconate Cloth  6  each Topical Daily  ? darbepoetin (ARANESP) injection - NON-DIALYSIS  100 mcg Subcutaneous Q Sat-1800  ? doxercalciferol  9 mcg Intravenous Q M,W,F-HD  ? ezetimibe  10 mg Oral Daily  ? feeding supplement (PROSource TF)  45 mL Per Tube BID  ? feeding supplement (VITAL 1.5 CAL)  600 mL Per Tube Q24H  ? insulin aspart  0-20 Units Subcutaneous Q4H  ? insulin detemir  12 Units Subcutaneous BID  ? latanoprost  1 drop Both Eyes QHS  ? midodrine  15 mg Oral Q6H  ? multivitamin  1 tablet Per Tube QHS  ? pantoprazole sodium  40 mg Per Tube Daily  ? rosuvastatin  10 mg Per Tube QHS  ? sodium chloride flush  10-40 mL Intracatheter Q12H  ? sodium chloride flush  3 mL Intravenous Q12H  ? vancomycin variable dose per unstable renal function (pharmacist dosing)   Does not apply See admin instructions  ? ? ? ? ?

## 2022-02-03 NOTE — Assessment & Plan Note (Signed)
Multifactorial shock, now clinically resolved, continue close blood pressure monitoring and blood pressure support with midodrine.  ?

## 2022-02-03 NOTE — Assessment & Plan Note (Addendum)
Prior history of CVA  ?Patient had ICU delirium, she has a NG tube in place for feeding due to swallow dysfunction.  ? ?New neurologic deficit, further work up with brain MRI with 5 mm focus of diffusion abnormality involving the posterior left basal ganglia, consistent with small acute to early subacute ischemic infarct. Multiple remote infarcts. Chronic encephalomalacia a the anterior inferior left frontal lobe.  ? ?Clinically today is more awake and interactive, follows commands and responds to questions appropriately.  ?Because rectal bleeding will stop anticoagulation.  ?Follow with neurology recommendations. ?

## 2022-02-03 NOTE — Progress Notes (Signed)
?Progress Note ? ? ?Patient: Jody Nguyen HYW:[id number] DOB: [birth year] DOA: 01/21/2022     33 ?DOS: the patient was seen and examined on 02/03/2022 ?  ?Brief hospital course: ?Jody Nguyen is a 66 year old female with past medical history significant for pulmonary hypertension followed by cardiology at [facility], chronic diastolic congestive heart failure, previous MV repair, nonobstructive CAD per cath done in 2016, hypertension, hyperlipidemia, ESRD on HD MWF, CVA, type 2 diabetes, history of PE on Eliquis who presented to [facility] ED on 2/4 with complaint of persistent chest pain. Patient states she is having episodes of chest pain this past week.  Reports nonexertional pain all across her chest with radiation to the back and throbbing in nature.  Each episode lasts about 10 minutes.  She has had episodes of chest pain after dialysis but having it even on nondialysis days.  Associated with nausea and dyspnea.  Denies history of blood clots. Last dialysis session was on Friday and she still makes urine. ? ?She was seen in the ED on 12/28/2021 for chest pain and troponins were elevated at 100 but flat and consistent with previous.  She was slightly hypotensive and given fluid bolus.  EKG with no acute changes.  Seen by cardiology on 12/29/2021 and it was felt that her chest pain and troponin elevation were due to sudden fluid volume shift as chest pain occurred during dialysis.  Plan was to monitor her symptoms and discuss settings with HD prior to pursuing invasive cardiac catheterization. ? ?In the ED, WBC 3.8 (slightly low on recent labs as well), hemoglobin 10.7 (stable), platelet count 130k.  Sodium 137, potassium 3.2, chloride 91, bicarb 32, BUN 29, creatinine 7.2, glucose 119.  EKG showing sinus rhythm, new LAFB and RBBB, QTC prolongation. High-sensitivity troponin elevated but stable (210 > 201). COVID and flu negative.  Chest x-ray showing no active disease. While in the ED, patient had an  episode of sinus tachycardia with rate in the 130s to 140s and became hypotensive with systolic in the 48N. 500 cc fluid bolus was given after which blood pressure improved and heart rate normalized.  ED physician discussed the case with cardiology, recommended admission for observation. TRH consulted. ? ?She is found to have severe coronary calcification and multivessel CAD.  Recommendation was to obtain a surgical consultation for consideration for CABG and cardiothoracic surgery feels that she is high risk but she would likely benefit from a CABG.  Plan is for surgical intervention on Friday, 2/10/202. ? ?Post CABG she suffered multiple complications including; persistent vasoplegic shock requiring weeks ?for pressor support, GNR bacteremia seen 2/17 (completed 7 days of ABT but became febrile again 3/5 ?and ABT resumed), renal failure requiring CRRT initiation (now on iHD and TDC placed 3/9), ?anemia/thrombocytopenia requiring transfusions, and ICU delirium She has now been pressor free since ?3/6 and is stable for transfer out of ICU. ? ?03/09 focal neurologic deficit with left sided weakness and right gaze preference, new from prior days.  ?CT with no acute changes, neurology was consulted and recommended brain MRI.  ? ?Transfer to [facility] 02/03/22 ? ? ? ? ? ?Assessment and Plan: ?* Chest pain, Multivessel CAD ? Underwent left heart catheterization 01/09/2022 with severe coronary calcification and multivessel CAD with LAD 99%, first diagonal 90%, ostial circumflex to proximal circumflex 100%, proximal RCA 100% stenosis. ? ?Now patient is sp CABG and tricuspid valve repair.  ?Continue with aspirin, ezetimibe and atorvastatin  ?No chest pain today.  ? ?  Chronic combined systolic and diastolic heart failure ([place]) ?TTE with LVEF now 40-45%, LV mildly decreased function, no regional wall motion normalities, moderate LVH, RVSP 39.6, LA moderately dilated, MV repair noted, severe TR, moderate TV stenosis. ? ?Patient has  recovered from shock, now off vasopressors. ?Continue blood pressure support with midodrine  ? ? ?Shock ([place]) ?Multifactorial shock, now clinically resolved, continue close blood pressure monitoring and blood pressure support with midodrine.  ? ?Gram-negative bacteremia ?Continue antibiotic therapy with cefepime and vancomycin ?Follow up cell count and temperature curve.  ? ?Metabolic encephalopathy ?Patient had ICU delirium, she has a NG tube in place for feeding. ?She has difficulty speaking but able to follow simple commands. ?Follow up on brain MRI, and neurology recommendations.  ? ?Anemia of chronic renal failure ?Continue renal replacement therapy per nephrology recommendations. ?Patient today with no signs of volume overload.  ? ?Atrial fibrillation ([place]) ?Continue rate control with amiodarone, continue telemetry monitoring.  ?Continue with aspirin, holding on anticoagulation for now.  ? ?Pulmonary hypertension ([place]) ?Continue oxymetry monitoring, holding on pulmonary hypertension therapy for now.  ? ?Type 2 diabetes mellitus ([place]) ?Continue glucose cover and monitoring with insulin sliding scale and basal insulin 12 units bid ?Fasting insulin this am 158 ? ? ?End stage renal disease on dialysis [facility]) ?Hyponatremia,. ?Continue renal replacement therapy per nephrology recommendations ?Today patient clinically euvolemic  ?NA is 130 with K at 4,8 and serum bicarbonate at 23.  ? ?History of stroke ?Continue with statin therapy and aspirin. ?Continue neuro checks per unit protocol and follow up with brain MRI ? ?Obesity (BMI 30-39.9) ?-Complicates overall prognosis and care ?-Estimated body mass index is 35.55 kg/m? as calculated from the following: ?  Height as of this encounter: 4' 11.5" (1.511 m). ?  Weight as of this encounter: 81.2 kg.  ?-Weight Loss and Dietary Counseling given and discussed with patient needs for aggressive lifestyle changes/weight loss as this complicates all facets of care.   ?-Will need  Outpatient follow-up with PCP.   ? ?Acute hypoxemic respiratory failure ([place]) ?Clinically improved, continue oxymetry monitoring and supplemental 02 per [place]  ? ?GERD (gastroesophageal reflux disease) ?/GI Prophylaxis ?-C/w Pantoprazole 40 mg po Daily PRN GERD ? ?SVT (supraventricular tachycardia) ([place]) ?Patient now on amiodarone, continue telemetry monitoring.  ? ?QT prolongation ?Continue telemetry monitoring  ? ?Essential hypertension ?She had an episode of hypotension in the ED which resolved after a 500 cc fluid bolus.   ?-Blood pressure currently stable.   ?-Currently On carvedilol 12.5 mg p.o. twice daily, losartan 50 mg p.o. daily, Hydralazine 50mg  PO TID at home. ?-Was holding home antihypertensives currently due to transient hypotension on admission but has been started on Metoprolol Tartrate 12.5 mg po BID  ?-C/wHydralazine 25mg  PO q8h PRN SBP >180 or SBP >110 ?-Continue monitor BP closely and per protocol ?-Last BP reading was 91/62 ? ?S/P minimally invasive mitral valve repair-resolved as of 01/16/2022 ?-Has had a previous mitral valve repair and cardiology and cardiothoracic surgery is following ? ? ? ? ?  ? ?Subjective: Patient is awake and alert, not able to communicate with voice  ? ?Physical Exam: ?Vitals:  ? 02/03/22 0358 02/03/22 0518 02/03/22 0824 02/03/22 1107  ?BP: (!) 95/47  101/80 (!) 103/52  ?Pulse: 76  78 76  ?Resp: 17  18 17   ?Temp: (!) 97.4 ?F (36.3 ?C)  98.1 ?F (36.7 ?C) (!) 97.5 ?F (36.4 ?C)  ?TempSrc: Oral  Oral Oral  ?SpO2: 100%  100% 93%  ?Weight:  72.5 kg    ?Height:      ? ?Neurology awake and alert, follows simple commands but not able to answer questions ?ENT with mild pallor, NG tube in place ?Cardiovascular with S1 and S2 present and rhythmic with no gallops or rubs ?No lower extremity edema.  ?Respiratory with no rales or wheezing ?Abdomen soft and non tender  ?Data Reviewed: ? ? ? ?Family Communication: no family at the bedside  ? ?Disposition: ?Status is: Inpatient ?Remains  inpatient appropriate because: heart failure  ? Planned Discharge Destination:  to be determined  ? ? ? ? ?Author: ?Tawni Millers, MD ?02/03/2022 12:22 PM ? ?For on call review [email or web address].  ?

## 2022-02-04 ENCOUNTER — Encounter (HOSPITAL_COMMUNITY): Payer: Medicare Other

## 2022-02-04 DIAGNOSIS — N186 End stage renal disease: Secondary | ICD-10-CM | POA: Diagnosis not present

## 2022-02-04 DIAGNOSIS — I639 Cerebral infarction, unspecified: Secondary | ICD-10-CM | POA: Diagnosis not present

## 2022-02-04 DIAGNOSIS — R072 Precordial pain: Secondary | ICD-10-CM | POA: Diagnosis not present

## 2022-02-04 DIAGNOSIS — N189 Chronic kidney disease, unspecified: Secondary | ICD-10-CM | POA: Diagnosis not present

## 2022-02-04 DIAGNOSIS — R579 Shock, unspecified: Secondary | ICD-10-CM | POA: Diagnosis not present

## 2022-02-04 DIAGNOSIS — J9601 Acute respiratory failure with hypoxia: Secondary | ICD-10-CM | POA: Diagnosis not present

## 2022-02-04 LAB — GLUCOSE, CAPILLARY
Glucose-Capillary: 154 mg/dL — ABNORMAL HIGH (ref 70–99)
Glucose-Capillary: 187 mg/dL — ABNORMAL HIGH (ref 70–99)
Glucose-Capillary: 203 mg/dL — ABNORMAL HIGH (ref 70–99)
Glucose-Capillary: 206 mg/dL — ABNORMAL HIGH (ref 70–99)
Glucose-Capillary: 95 mg/dL (ref 70–99)

## 2022-02-04 LAB — CBC WITH DIFFERENTIAL/PLATELET
Abs Immature Granulocytes: 0.14 10*3/uL — ABNORMAL HIGH (ref 0.00–0.07)
Basophils Absolute: 0.1 10*3/uL (ref 0.0–0.1)
Basophils Relative: 1 %
Eosinophils Absolute: 1 10*3/uL — ABNORMAL HIGH (ref 0.0–0.5)
Eosinophils Relative: 10 %
HCT: 25.8 % — ABNORMAL LOW (ref 36.0–46.0)
Hemoglobin: 7.8 g/dL — ABNORMAL LOW (ref 12.0–15.0)
Immature Granulocytes: 2 %
Lymphocytes Relative: 10 %
Lymphs Abs: 0.9 10*3/uL (ref 0.7–4.0)
MCH: 30.2 pg (ref 26.0–34.0)
MCHC: 30.2 g/dL (ref 30.0–36.0)
MCV: 100 fL (ref 80.0–100.0)
Monocytes Absolute: 1.1 10*3/uL — ABNORMAL HIGH (ref 0.1–1.0)
Monocytes Relative: 12 %
Neutro Abs: 6.3 10*3/uL (ref 1.7–7.7)
Neutrophils Relative %: 65 %
Platelets: 229 10*3/uL (ref 150–400)
RBC: 2.58 MIL/uL — ABNORMAL LOW (ref 3.87–5.11)
RDW: 23.3 % — ABNORMAL HIGH (ref 11.5–15.5)
Smear Review: ADEQUATE
WBC: 9.6 10*3/uL (ref 4.0–10.5)
nRBC: 6.5 % — ABNORMAL HIGH (ref 0.0–0.2)

## 2022-02-04 LAB — HEMOGLOBIN AND HEMATOCRIT, BLOOD
HCT: 25.7 % — ABNORMAL LOW (ref 36.0–46.0)
HCT: 21 % — ABNORMAL LOW (ref 36.0–46.0)
Hemoglobin: 7.8 g/dL — ABNORMAL LOW (ref 12.0–15.0)
Hemoglobin: 6.3 g/dL — CL (ref 12.0–15.0)

## 2022-02-04 LAB — BASIC METABOLIC PANEL
Anion gap: 10 (ref 5–15)
BUN: 26 mg/dL — ABNORMAL HIGH (ref 8–23)
CO2: 25 mmol/L (ref 22–32)
Calcium: 10 mg/dL (ref 8.9–10.3)
Chloride: 97 mmol/L — ABNORMAL LOW (ref 98–111)
Creatinine, Ser: 2.85 mg/dL — ABNORMAL HIGH (ref 0.44–1.00)
GFR, Estimated: 18 mL/min — ABNORMAL LOW (ref >60)
Glucose, Bld: 214 mg/dL — ABNORMAL HIGH (ref 70–99)
Potassium: 3.8 mmol/L (ref 3.5–5.1)
Sodium: 132 mmol/L — ABNORMAL LOW (ref 135–145)

## 2022-02-04 LAB — PREPARE RBC (CROSSMATCH)

## 2022-02-04 LAB — HEPATITIS B SURFACE ANTIBODY, QUANTITATIVE: Hep B S AB Quant (Post): 330 m[IU]/mL (ref >9.9)

## 2022-02-04 MED ORDER — OXYCODONE HCL 5 MG PO TABS
5.0000 mg | ORAL_TABLET | ORAL | Status: DC | PRN
  Administered 2022-02-04: 10 mg
  Filled 2022-02-04: qty 2

## 2022-02-04 MED ORDER — ASPIRIN EC 325 MG PO TBEC: 325.0000 mg | DELAYED_RELEASE_TABLET | Freq: Every day | ORAL | Status: DC

## 2022-02-04 MED ORDER — DEXTROSE 5 % IV SOLN: INTRAVENOUS | Status: DC

## 2022-02-04 MED ORDER — SODIUM CHLORIDE 0.9% IV SOLUTION: Freq: Once | INTRAVENOUS | Status: AC

## 2022-02-04 MED ORDER — MIDAZOLAM HCL 2 MG/2ML IJ SOLN
2.0000 mg | INTRAMUSCULAR | Status: DC | PRN
  Administered 2022-02-04 (×4): 4 mg via INTRAVENOUS
  Filled 2022-02-04 (×4): qty 4

## 2022-02-04 MED ORDER — TRAMADOL HCL 50 MG PO TABS: 50.0000 mg | ORAL_TABLET | Freq: Two times a day (BID) | ORAL | Status: DC | PRN

## 2022-02-04 MED ORDER — DIPHENHYDRAMINE HCL 50 MG/ML IJ SOLN: 25.0000 mg | INTRAMUSCULAR | Status: DC | PRN

## 2022-02-04 MED ORDER — DOCUSATE SODIUM 100 MG PO CAPS: 200.0000 mg | ORAL_CAPSULE | Freq: Every day | ORAL | Status: DC | PRN

## 2022-02-04 MED ORDER — ACETAMINOPHEN 325 MG PO TABS: 650.0000 mg | ORAL_TABLET | Freq: Four times a day (QID) | ORAL | Status: DC | PRN

## 2022-02-04 MED ORDER — HYDROXYZINE HCL 10 MG PO TABS
10.0000 mg | ORAL_TABLET | Freq: Four times a day (QID) | ORAL | Status: DC | PRN
  Filled 2022-02-04: qty 1

## 2022-02-04 MED ORDER — ASPIRIN 81 MG PO CHEW: 324.0000 mg | CHEWABLE_TABLET | Freq: Every day | ORAL | Status: DC

## 2022-02-04 MED ORDER — MIDODRINE HCL 5 MG PO TABS
15.0000 mg | ORAL_TABLET | Freq: Four times a day (QID) | ORAL | Status: DC
  Administered 2022-02-04 (×2): 15 mg
  Filled 2022-02-04 (×2): qty 3

## 2022-02-04 MED ORDER — GLYCOPYRROLATE 0.2 MG/ML IJ SOLN
0.2000 mg | INTRAMUSCULAR | Status: DC | PRN
  Administered 2022-02-04: 0.2 mg via INTRAVENOUS
  Filled 2022-02-04: qty 1

## 2022-02-04 MED ORDER — EZETIMIBE 10 MG PO TABS: 10.0000 mg | ORAL_TABLET | Freq: Every day | ORAL | Status: DC

## 2022-02-04 MED ORDER — STROKE: EARLY STAGES OF RECOVERY BOOK
Freq: Once | Status: AC
  Filled 2022-02-04: qty 1

## 2022-02-04 MED ORDER — FOOD THICKENER (SIMPLYTHICK HONEY): 10.0000 | ORAL | Status: DC | PRN

## 2022-02-04 MED ORDER — ACETAMINOPHEN 650 MG RE SUPP: 650.0000 mg | Freq: Four times a day (QID) | RECTAL | Status: DC | PRN

## 2022-02-04 MED ORDER — DOCUSATE SODIUM 50 MG/5ML PO LIQD: 200.0000 mg | Freq: Every day | ORAL | Status: DC | PRN

## 2022-02-04 MED ORDER — PANTOPRAZOLE 2 MG/ML SUSPENSION
40.0000 mg | Freq: Every day | ORAL | Status: DC
  Administered 2022-02-04: 40 mg
  Filled 2022-02-04: qty 20

## 2022-02-04 MED ORDER — POLYVINYL ALCOHOL 1.4 % OP SOLN
1.0000 [drp] | Freq: Four times a day (QID) | OPHTHALMIC | Status: DC | PRN
  Filled 2022-02-04: qty 15

## 2022-02-04 MED ORDER — DEXTROSE 10 % IV SOLN: INTRAVENOUS | Status: DC

## 2022-02-04 MED ORDER — GLYCOPYRROLATE 1 MG PO TABS
1.0000 mg | ORAL_TABLET | ORAL | Status: DC | PRN
Start: 2022-02-04 — End: 2022-02-05
  Filled 2022-02-04: qty 1

## 2022-02-04 MED ORDER — GLYCOPYRROLATE 0.2 MG/ML IJ SOLN: 0.2000 mg | INTRAMUSCULAR | Status: DC | PRN

## 2022-02-04 MED ORDER — MORPHINE SULFATE (PF) 2 MG/ML IV SOLN
2.0000 mg | INTRAVENOUS | Status: DC | PRN
  Administered 2022-02-04 (×4): 4 mg via INTRAVENOUS
  Filled 2022-02-04 (×4): qty 2

## 2022-02-04 MED ORDER — AMIODARONE HCL 200 MG PO TABS: 200.0000 mg | ORAL_TABLET | Freq: Two times a day (BID) | ORAL | Status: DC

## 2022-02-04 MED ORDER — ROSUVASTATIN CALCIUM 5 MG PO TABS: 10.0000 mg | ORAL_TABLET | Freq: Every day | ORAL | Status: DC

## 2022-02-05 LAB — TYPE AND SCREEN
ABO/RH(D): O POS
Antibody Screen: NEGATIVE
Unit division: 0
Unit division: 0
Unit division: 0

## 2022-02-05 LAB — BPAM RBC
Blood Product Expiration Date: 202304052359
Blood Product Expiration Date: 202304052359
Blood Product Expiration Date: 202304092359
ISSUE DATE / TIME: 202303111829
Unit Type and Rh: 5100
Unit Type and Rh: 5100
Unit Type and Rh: 5100

## 2022-02-05 NOTE — Progress Notes (Signed)
Two nurses [facility] RN and Therapist, occupational) noted time of death to be [id number], no breath sounds, no cardiac sounds, asystole noted printed from monitor. Family at bedside, comfort given. ? ? ?Audria Nine DO notified, Elink notified  ?

## 2022-02-06 LAB — GLUCOSE, CAPILLARY: Glucose-Capillary: 88 mg/dL (ref 70–99)

## 2022-02-14 ENCOUNTER — Ambulatory Visit: Payer: Medicare Other | Admitting: Thoracic Surgery (Cardiothoracic Vascular Surgery)

## 2022-02-25 NOTE — Consult Note (Addendum)
Neurology Consultation  Reason for Consult: stroke on MRI B  Referring Physician: Dr. Cathlean Sauer   CC: chest pain  History is obtained from:medical record   HPI: Jody Nguyen is a 66 y.o. female with past medical history of pulmonary hypertension followed by cardiology at [facility], chronic diastolic heart failure, previous MV repair, nonobstructive CAD per cath done in 2016, hypertension, hyperlipidemia, ESRD on HD MWF, CVA, type 2 diabetes, history of PE on Eliquis who presented on 01/01/2022 for chest pain.   "On ED presentation 2/4, patient reported episodic chest pain, non-exertional and intermittent x 1 week with associated nausea and SOB. Non-specific EKG changes. Cardiology consulted, STEMI ruled out. Troponin elevated at 200. Placed on [place] for SpO2 80s. Admitted to [facility] for observation with concern for PE and started on heparin gtt. VQ with low probability for PE. Echo 2/19 demonstrated severe RV/RA enlargement, severely reduced RV function and septal bowing; felt to be consistent with chronic process such as severe pulmonary HTN"  She has a long complicated hospitalization :  2/4 ED presentation with recurring chest pain -- STEMI ruled out. Stable, mildly hypoxic and placed on [place] 2/5 Admit to [facility] for obvs. Concern for PE, ECHO performed by cardiology reportedly shows RV failure, but felt more consistent with chronic process like severe pulmHTN. Awaiting VQ scan. Pulm consulted  2/7 Cardiac cath. 3 vessel dz. CABG rec 2/10 CABG, TVR 2/11 Continued vasoplegic shock with very high vasopressor requirements  2/12 Given methylene blue x 2 2/13 Chest tube removed 2/14 JP removed 2/15 pacing wires removed 2/16 milrinone stopped, extubated 2/17 Zosyn for GNR bacteremia-> Morganella morganii 2/18 Arterial line, central line, vascath all removed and replaced. 2/19 Milrinone back on. Co-ox improved. ECHO Left ventricular ejection fraction, by estimation, is 50 to 55%. The left ventricle has low  normal function. No regional WM abnormality. Right Ventricle: Ventricular septum is flattened in systole and diastole  suggesting RV pressure and volume overload. The right ventricular size is  severely enlarged. Right vetricular wall thickness was not well  visualized. Right ventricular systolic  function is severely reduced. There is moderately elevated pulmonary  artery systolic pressure 6/14 still high pressor needs. CRRT stopped. Having intermittent bouts of tachycardia 2/21 Hemodynamics better. Weaning Epi. Still on Norepi and inotropic support. Supported on BIPAP over night and tolerated well. On am rounds more lethargic w/ generalized pain everywhere. Co-ox > 70. Glucose poorly controlled. 2/22 Escalating pressor requirements (NE, Epi, Vaso). Continues on milrinone. Afib with RVR overnight, now with persistent AF (on amio, bival). Co-ox 73%. Hgb 7.3. WBC uptrending (25 from 20), Cr uptrending, CRRT restarted. Tolerating BiPAP. 2/23 Improving pressor needs. Stable Epi/Vaso, downtitrating NE. Milrinone 0.25. Persistent AF, rate controlled. Co-ox 65.7%. Ongoing CRRT. BiPAP QHS. 2/24 Continued decreasing pressor needs, stable epi/vaso, downtitrating Levo.  Milrinone 0.25.  Intermittent A-fib, in and out of NSR, rates 90s.  Co-ox 76.1%.  Ongoing CRRT, did not utilize BiPAP overnight. FEES with SLP. 2/25 confused.  Pressor requirements down some.  Got 1 unit of blood for hemoglobin of 7, Co. ox dropped. Vasopressin decreased to 0.03 units.  3/2 stopped epinephrine, Co. oximetry dropped quickly 3/3 back on epinephrine, with plan to eventually titrate vasopressin off and trial dobutamine.  Currently on room air.  Co. oximetry's in the 40s, hypoglycemic requiring titration down on basal dose insulin after changing tube feeds to nocturnal 3/6 off Epi since 3 am  3/9 Has remained off pressors since 3/6, remains confused with worsened agitation 3/8 per  spouse   On 3/9 while working with therapy, patient was  noted to have Right gaze and left side weakness which prompted a CT. CTH results with no acute infarct. MRI Brain results with acute/subacute left basal ganglia infarct. Due to MRI B findings neurology has been consulted. Patient also has been struggling with delirium and now currently having BRBPR.   LKW: Unknown  tpa given?: no, outside of window  Premorbid modified Rankin scale (mRS):  4-Needs assistance to walk and tend to bodily needs   ROS:  Unable to obtain due to altered mental status and cooperation of patient. No family at bedside   Past Medical History:  Diagnosis Date   Anemia    Chronic diastolic CHF (congestive heart failure) (HCC)    Chronic diastolic heart failure (HCC)    CKD (chronic kidney disease) stage 4, GFR 15-29 ml/min (HCC)    dialysis M/W/F   Constipation    CVA (cerebral infarction) 1997   no residual deficit   Diverticulitis    DM (diabetes mellitus) (HCC)    type 2   Heart murmur    History of cardiovascular stress test    a. Myoview Oct 2012 showed EF 49%, no ischemia, LVE   HLD (hyperlipidemia)    takes Crestor daily   Hypertension    Pericardial effusion    chronic; felt to be poss related to minoxidil >> DC'd   Pulmonary hypertension ([place]) 06/18/2015   Respiratory failure, acute hypoxic, post-operative 07/08/2015   Requiring ECMO support   Respiratory failure, post-operative (HCC)    S/P cardiac catheterization    a. R/L HC 06/18/15:  mLAD 30%; severe pulmo HTN with PA sat 43%, CI 1.86, prominent V waves indicative of MR; resting hypoxemia O2 sat 86% on RA   S/P minimally invasive mitral valve repair 07/08/2015   Complex valvuloplasty including artificial Gore-tex neochord placement x10 and 26 mm Sorin Memo 3D Rechord ring annuloplasty via right mini thoracotomy approach   S/P minimally invasive mitral valve repair 07/08/2015   Complex valvuloplasty including artificial Gore-tex neochord placement x10 and 26 mm Sorin Memo 3D Rechord ring  annuloplasty via right mini thoracotomy approach   Severe mitral regurgitation    Shortness of breath dyspnea    Stroke ([place]) 1997   no residual effect   Vitamin D deficiency    Wears glasses     Essential (primary) hypertension, Heart failure, unspecified, Type 2 diabetes mellitus with hyperglycemia , and ESRD   Family History  Problem Relation Age of Onset   Stroke Mother    Breast cancer Sister    Cancer Sister        breast cancer   Heart attack Brother        MI in his 70s   Heart disease Brother        before age 74   Cancer Brother      Social History:   reports that she has never smoked. She has never used smokeless tobacco. She reports that she does not drink alcohol and does not use drugs.  Medications  Current Facility-Administered Medications:    0.9 %  sodium chloride infusion (Manually program via Guardrails IV Fluids), , Intravenous, Continuous, Bensimhon, Shaune Pascal, MD   0.9 %  sodium chloride infusion (Manually program via Guardrails IV Fluids), , Intravenous, PRN, Bensimhon, Shaune Pascal, MD   0.9 %  sodium chloride infusion, , Intravenous, PRN, Bensimhon, Shaune Pascal, MD, Stopped at 02/01/22 2059   0.9 %  sodium  chloride infusion, , Intravenous, Continuous, Bensimhon, Shaune Pascal, MD, Last Rate: 10 mL/hr at 01/31/22 2057, New Bag at 01/31/22 2057   0.9 %  sodium chloride infusion, , Intravenous, Continuous, Bensimhon, Shaune Pascal, MD, Stopped at 01/31/22 2057   amiodarone (PACERONE) tablet 200 mg, 200 mg, Oral, BID, [name], MD, 200 mg at 02/03/22 2212   aspirin EC tablet 325 mg, 325 mg, Oral, Daily **OR** [DISCONTINUED] aspirin chewable tablet 324 mg, 324 mg, Per Tube, Daily, Bensimhon, Shaune Pascal, MD, 324 mg at 02/03/22 0854   bisacodyl (DULCOLAX) EC tablet 10 mg, 10 mg, Oral, Daily PRN **OR** bisacodyl (DULCOLAX) suppository 10 mg, 10 mg, Rectal, Daily PRN, Candee Furbish, MD   ceFEPIme (MAXIPIME) 1 g in sodium chloride 0.9 % 100 mL IVPB, 1 g, Intravenous,  Q24H, Einar Grad, RPH, Stopped at 02/03/22 2049   chlorhexidine gluconate (MEDLINE KIT) (PERIDEX) 0.12 % solution 15 mL, 15 mL, Mouth Rinse, BID, Bensimhon, Shaune Pascal, MD, 15 mL at 02/03/22 2012   Chlorhexidine Gluconate Cloth 2 % PADS 6 each, 6 each, Topical, Daily, Bensimhon, Shaune Pascal, MD, 6 each at 02/03/22 9702   Darbepoetin Alfa (ARANESP) injection 100 mcg, 100 mcg, Subcutaneous, Q Sat-1800, Bensimhon, Shaune Pascal, MD, 100 mcg at 01/28/22 1744   docusate sodium (COLACE) capsule 200 mg, 200 mg, Oral, Daily PRN, Arrien, Jimmy Picket, MD   doxercalciferol (HECTOROL) injection 9 mcg, 9 mcg, Intravenous, Q M,W,F-HD, Bensimhon, Shaune Pascal, MD, 9 mcg at 02/03/22 1537   ezetimibe (ZETIA) tablet 10 mg, 10 mg, Oral, Daily, Lyda Jester M, PA-C, 10 mg at 02/03/22 0853   feeding supplement (PROSource TF) liquid 45 mL, 45 mL, Per Tube, BID, Bensimhon, Shaune Pascal, MD, 45 mL at 02/03/22 2204   feeding supplement (VITAL 1.5 CAL) liquid 600 mL, 600 mL, Per Tube, Q24H, Candee Furbish, MD, Stopped at 02/25/2022 [phone]   food thickener (SIMPLYTHICK (HONEY/LEVEL 3/MODERATELY THICK)) 10 packet, 10 packet, Oral, PRN, Bensimhon, Shaune Pascal, MD, 1 packet at 01/28/22 0444   Gerhardt's butt cream, , Topical, PRN, Julian Hy, DO, Given at 02/02/22 0404   hydrOXYzine (ATARAX) tablet 10 mg, 10 mg, Oral, Q6H PRN, Arrien, Jimmy Picket, MD, 10 mg at 02/03/22 2207   insulin aspart (novoLOG) injection 0-20 Units, 0-20 Units, Subcutaneous, Q4H, Bensimhon, Shaune Pascal, MD, 7 Units at 2022/02/25 0427   insulin detemir (LEVEMIR) injection 12 Units, 12 Units, Subcutaneous, BID, Candee Furbish, MD, 12 Units at 02/03/22 0855   latanoprost (XALATAN) 0.005 % ophthalmic solution 1 drop, 1 drop, Both Eyes, QHS, Bensimhon, Shaune Pascal, MD, 1 drop at 02/03/22 2215   lip balm (CARMEX) ointment, , Topical, PRN, Candee Furbish, MD, 75 application. at 01/29/22 1949   midodrine (PROAMATINE) tablet 15 mg, 15 mg, Oral, Q6H, Candee Furbish, MD,  15 mg at 2022-02-25 0408   multivitamin (RENA-VIT) tablet 1 tablet, 1 tablet, Per Tube, QHS, Bensimhon, Shaune Pascal, MD, 1 tablet at 02/03/22 2213   ondansetron (ZOFRAN) injection 4 mg, 4 mg, Intravenous, Q6H PRN, Bensimhon, Shaune Pascal, MD, 4 mg at 01/31/22 0007   oxyCODONE (Oxy IR/ROXICODONE) immediate release tablet 5-10 mg, 5-10 mg, Oral, Q3H PRN, Arrien, Jimmy Picket, MD   pantoprazole (PROTONIX) EC tablet 40 mg, 40 mg, Oral, Daily, Arrien, Jimmy Picket, MD   rosuvastatin (CRESTOR) tablet 10 mg, 10 mg, Oral, QHS, Arrien, Jimmy Picket, MD, 10 mg at 02/03/22 2208   sodium chloride flush (NS) 0.9 % injection 10-40 mL, 10-40 mL, Intracatheter, Q12H, Bensimhon,  Shaune Pascal, MD, 10 mL at 02/03/22 2725   sodium chloride flush (NS) 0.9 % injection 10-40 mL, 10-40 mL, Intracatheter, PRN, Bensimhon, Shaune Pascal, MD   sodium chloride flush (NS) 0.9 % injection 3 mL, 3 mL, Intravenous, Q12H, Bensimhon, Shaune Pascal, MD, 3 mL at 02/03/22 2222   sodium chloride flush (NS) 0.9 % injection 3 mL, 3 mL, Intravenous, PRN, Bensimhon, Shaune Pascal, MD   traMADol (ULTRAM) tablet 50-100 mg, 50-100 mg, Oral, Q12H PRN, Arrien, Jimmy Picket, MD   Exam: Current vital signs: BP (!) 121/58 (BP Location: Left Leg)    Pulse 87    Temp 98.1 F (36.7 C) (Oral)    Resp 20    Ht 4' 11.5" (1.511 m)    Wt 74.4 kg    SpO2 100%    BMI 32.57 kg/m  Vital signs in last 24 hours: Temp:  [97.3 F (36.3 C)-98.1 F (36.7 C)] 98.1 F (36.7 C) (03/11 0801) Pulse Rate:  [76-90] 87 (03/11 0801) Resp:  [15-20] 20 (03/11 0801) BP: (95-177)/(35-167) 121/58 (03/11 0801) SpO2:  [91 %-100 %] 100 % (03/11 0801) Weight:  [74.4 kg] 74.4 kg (03/11 0340)  GENERAL: critically ill appearing AA female, lethargic  HEENT: - Normocephalic and atraumatic, dry mm, LUNGS - Clear to auscultation bilaterally with no wheezes CV - S1S2 RRR, no m/r/g, equal pulses bilaterally. ABDOMEN - Soft, nontender, nondistended with normoactive BS. Feeding tube in  place Ext: warm, well perfused, intact peripheral pulses, edema 2+ on Right upper arm   NEURO:  Mental Status: patient has eyes closed, will not answer questions will not follow commands. She shakes her no to all questions asked. During exam she said "don't stick me" clear  Language: unable to assess Cranial Nerves: PERRL 2 mm/brisk. She appeared to have a slight left gaze, but would cross midline, blinks to threat,  no facial asymmetry, facial sensation intact, hearing intact Motor: moves all 4 extremities antigravity, patient not cooperative with exam  Tone: is normal and bulk is normal Sensation- withdraws to pain in all 4 extremities Coordination: unable to assess Gait- deferred  NIHSS: patient not cooperative with exam  1a Level of Conscious.: 1 1b LOC Questions: 2 1c LOC Commands: 2 2 Best Gaze: 1 3 Visual: 0 4 Facial Palsy: 0 5a Motor Arm - left: 1 5b Motor Arm - Right: 1 6a Motor Leg - Left: 1 6b Motor Leg - Right: 1 7 Limb Ataxia: 0 8 Sensory: 0 9 Best Language: 0 10 Dysarthria: 0 11 Extinct. and Inatten.: 0 TOTAL: 10    Labs I have reviewed labs in epic and the results pertinent to this consultation are:   CBC    Component Value Date/Time   WBC 9.6 26-Feb-2022 0347   RBC 2.58 (L) 02-26-22 0347   HGB 7.8 (L) 2022/02/26 0347   HGB 12.1 03/03/2013 0955   HCT 25.8 (L) 26-Feb-2022 0347   HCT 37.4 03/03/2013 0955   PLT 229 02-26-22 0347   PLT 288 03/03/2013 0955   MCV 100.0 02/26/22 0347   MCV 77.9 (L) 03/03/2013 0955   MCH 30.2 February 26, 2022 0347   MCHC 30.2 2022-02-26 0347   RDW 23.3 (H) 2022/02/26 0347   RDW 16.1 (H) 03/03/2013 0955   LYMPHSABS 0.9 02/26/2022 0347   LYMPHSABS 1.8 03/03/2013 0955   MONOABS 1.1 (H) 02/26/2022 0347   MONOABS 0.4 03/03/2013 0955   EOSABS 1.0 (H) 26-Feb-2022 0347   EOSABS 0.3 03/03/2013 0955   BASOSABS 0.1 02-26-2022 0347  BASOSABS 0.0 03/03/2013 0955    CMP     Component Value Date/Time   NA 132 (L) 2022/03/02  0347   NA 140 03/03/2013 0955   K 3.8 03-02-2022 0347   K 3.8 03/03/2013 0955   CL 97 (L) 2022/03/02 0347   CL 99 03/03/2013 0955   CO2 25 03/02/22 0347   CO2 29 03/03/2013 0955   GLUCOSE 214 (H) 02-Mar-2022 0347   GLUCOSE 210 (H) 03/03/2013 0955   BUN 26 (H) March 02, 2022 0347   BUN 28.1 (H) 03/03/2013 0955   CREATININE 2.85 (H) 03/02/22 0347   CREATININE 7.21 (H) 12/08/2021 1110   CREATININE 1.9 (H) 03/03/2013 0955   CALCIUM 10.0 Mar 02, 2022 0347   CALCIUM 9.9 03/03/2013 0955   PROT 6.4 (L) 01/23/2022 0432   PROT 8.0 03/03/2013 0955   ALBUMIN 2.3 (L) 02/02/2022 0353   ALBUMIN 3.7 03/03/2013 0955   AST 99 (H) 01/23/2022 0432   AST 15 03/03/2013 0955   ALT 78 (H) 01/23/2022 0432   ALT 17 03/03/2013 0955   ALKPHOS 111 01/23/2022 0432   ALKPHOS 67 03/03/2013 0955   BILITOT 0.6 01/23/2022 0432   BILITOT 0.55 03/03/2013 0955   GFRNONAA 18 (L) March 02, 2022 0347   GFRNONAA 5 (L) 04/21/2021 1614   GFRAA 6 (L) 04/21/2021 1614    Lipid Panel     Component Value Date/Time   CHOL 227 (H) 12/08/2021 1110   TRIG 103 12/08/2021 1110   HDL 50 12/08/2021 1110   CHOLHDL 4.5 12/08/2021 1110   VLDL 25 06/07/2017 1006   LDLCALC 155 (H) 12/08/2021 1110     Imaging I have reviewed the images obtained:  CT-head 3/9: 1. Significant age advanced periventricular white matter disease progressive since 2017. 2. Remote left frontal infarct with encephalomalacia. 3. No acute intracranial findings or mass lesions. 4. Extensive bilateral mastoid effusions and fluid in the middle ear cavities.  MRI examination of the brain 3/10: 1. 5 mm focus of diffusion abnormality involving the posterior left basal ganglia, consistent with a small acute to early subacute ischemic infarct. No associated hemorrhage or mass effect. 2. Underlying advanced chronic microvascular ischemic disease with multiple remote infarcts about the hemispheric cerebral white matter, deep gray nuclei, and cerebellum. Chronic  encephalomalacia at the anterior inferior left frontal lobe could be related to prior ischemia or possibly trauma. 3. Multiple scattered chronic micro hemorrhages, most pronounced about the cerebellum and deep gray nuclei, most characteristic of chronic poorly controlled hypertension. 4. Large bilateral mastoid effusions.   Assessment:  TISA WEISEL is a 66 y.o. female with past medical history of pulmonary hypertension followed by cardiology at [facility], chronic diastolic heart failure, previous MV repair, nonobstructive CAD per cath done in 2016, hypertension, hyperlipidemia, ESRD on HD MWF, CVA, type 2 diabetes, history of PE on Eliquis who presented on 01/01/2022 for chest pain. She has a very long and complicated hospitalization with the most recent being delirium and a GI bleed.   Acute/Subacute left basal ganglia infarct secondary to small vessel disease  Stroke work up:  - HgbA1c 5.6 (01/05/22) - LDL-c 155 (12/08/21) - Echocardiogram EF 50-55% (01/15/22)  Recommendations: - US carotid ordered  - in the setting of GI bleed would hold ASA/Eliquis for now, consider restarting when OK with primary team - Risk factor modification - Telemetry monitoring - Bedside swallow screen when appropriate - PT consult, OT consult, Speech consult - Stroke team to follow  Beulah Gandy, [place], [id number]    Attending Neurohospitalist Addendum  Patient seen and examined with APP/Resident. Agree with the history and physical as documented above. Agree with the plan as documented, which I helped formulate. I have independently reviewed the chart, obtained history, review of systems and examined the patient.I have personally reviewed pertinent head/neck/spine imaging (CT/MRI). Please feel free to call with any questions.  -- Amie Portland, MD Neurologist [facility] Pager: [phone]

## 2022-02-25 NOTE — Progress Notes (Addendum)
Patient with persistent hematochezia, bright blood per rectum with clots. ?Her follow up HGb is down to 6.8.  ?Blood pressure  244 mmHg systolic, HR 81  ?Anticoagulation has been held.  ? ?Plan to transfuse 1 units PRBC ?Patient may need CT angiography to localize bleeding.  ?I spoke with Dr Ardis Hughs GI, with recommendations to remove rectal tube possible ulceration at the site of the tube.  ?

## 2022-02-25 NOTE — Progress Notes (Addendum)
Pt continuing to bleed rectally with 150 mL flexiseal output of mostly red blood.  Blood also pooling under Pt with large clots.  Rectum has small continuous bleeding visible.  Rapid response, MD, and charge nurse notified and in room.  Vitals stable currently, but BP has been soft intermittently.   ?

## 2022-02-25 NOTE — Progress Notes (Signed)
HOSPITAL MEDICINE OVERNIGHT EVENT NOTE   ? ?Contacted by nursing due to patient's waxing and waning confusion and agitation. ? ?Chart reviewed, patient is had prolonged hospitalization initially presented with chest pain found to have severe multivessel coronary artery disease status post CABG with numerous postoperative complications including shock requiring prolonged vasopressors and renal failure now on dialysis. ? ?Since discharge from the ICU patient has been suffering from ongoing delirium as well as intermittent difficulty with speech and other neurologic deficits.  MRI has been the evening of 3/10 revealing a focus of diffusion abnormality involving the posterior left basal ganglia consistent with acute to early subacute ischemic infarct. ? ?I discussed the MRI findings with Dr. Theda Sers with neurology who graciously has agreed to come evaluate the patient in consultation.  Considering the patient is on chronic Eliquis therapy he recommends not discontinuing this drug for now.  We will await further recommendations. ? ?Jody Emerald  MD ?[facility]  ? ? ? ? ? ? ? ? ? ? ?

## 2022-02-25 NOTE — Progress Notes (Signed)
Patient continuing to bleed rectally with clots, Hemoglobin 6.3 Dr. Cathlean Sauer aware and orders were received. Bedside RN updated. Yemariam Magar, Bettina Gavia ?RN  ?

## 2022-02-25 NOTE — Progress Notes (Signed)
? ?NAME:  Jody Nguyen, MRN:  [id number], DOB:  September 25, 1956, LOS: 30 ?ADMISSION DATE:  01/23/2022, CONSULTATION DATE:  01/12/2022 ?REFERRING MD:  Marlowe Sax - TRH, CHIEF COMPLAINT:  SOB  ? ?History of Present Illness:  ?66 year old woman who presented to [facility] ED 2/4 with CP radiating to her arm. PMHx significant for ESRD (on HD), non-obstructive CAD, suspected pulmonary hypertension, PE (on Eliquis). Recent ED presentation 2/1 for similar symptoms (troponin 100s). F/u with Cardiology as an outpatient 2/2 and CP was felt to be related to volume shift (occurring during dialysis).  ? ?On ED presentation 2/4, patient reported episodic chest pain, non-exertional and intermittent x 1 week with associated nausea and SOB. Non-specific EKG changes. Cardiology consulted, STEMI ruled out. Troponin elevated at 200. Placed on [place] for SpO2 80s. Admitted to [facility] for observation with concern for PE and started on heparin gtt. VQ with low probability for PE. Echo 2/19 demonstrated severe RV/RA enlargement, severely reduced RV function and septal bowing; felt to be consistent with chronic process such as severe pulmonary HTN.  ?  ?PCCM has been consulted in this setting. ? ?Pertinent Medical History:  ?ESRD ?PE ?Pulmonary HTN  ?CAD ?CVA ?HLD ?HTN ?S/p MV repair  ?Pericardial effusion r/t minoxidil  ?Diastolic HF ?DM2 ? ?Significant Hospital Events: ?Including procedures, antibiotic start and stop dates in addition to other pertinent events   ?2/4 ED presentation with recurring chest pain -- STEMI ruled out. Stable, mildly hypoxic and placed on [place] ?2/5 Admit to [facility] for obvs. Concern for PE, ECHO performed by cardiology reportedly shows RV failure, but felt more consistent with chronic process like severe pulmHTN. Awaiting VQ scan. Pulm consulted  ?2/7 Cardiac cath. 3 vessel dz. CABG rec ?2/10 CABG, TVR ?2/11 Continued vasoplegic shock with very high vasopressor requirements  ?2/12 Given methylene blue x 2 ?2/13 Chest tube removed ?2/14 JP  removed ?2/15 pacing wires removed ?2/16 milrinone stopped, extubated ?2/17 Zosyn for GNR bacteremia-> Morganella morganii ?2/18 Arterial line, central line, vascath all removed and replaced. ?2/19 Milrinone back on. Co-ox improved. ECHO Left ventricular ejection fraction, by estimation, is 50 to 55%. The left ventricle has low normal function. No regional WM abnormality. Right Ventricle: Ventricular septum is flattened in systole and diastole  suggesting RV pressure and volume overload. The right ventricular size is  severely enlarged. Right vetricular wall thickness was not well  visualized. Right ventricular systolic  function is severely reduced. There is moderately elevated pulmonary  ?artery systolic pressure ?2/20 still high pressor needs. CRRT stopped. Having intermittent bouts of tachycardia ?2/21 Hemodynamics better. Weaning Epi. Still on Norepi and inotropic support. Supported on BIPAP over night and tolerated well. On am rounds more lethargic w/ generalized pain everywhere. Co-ox > 70. Glucose poorly controlled. ?2/22 Escalating pressor requirements (NE, Epi, Vaso). Continues on milrinone. Afib with RVR overnight, now with persistent AF (on amio, bival). Co-ox 73%. Hgb 7.3. WBC uptrending (25 from 20), Cr uptrending, CRRT restarted. Tolerating BiPAP. ?2/23 Improving pressor needs. Stable Epi/Vaso, downtitrating NE. Milrinone 0.25. Persistent AF, rate controlled. Co-ox 65.7%. Ongoing CRRT. BiPAP QHS. ?2/24 Continued decreasing pressor needs, stable epi/vaso, downtitrating Levo.  Milrinone 0.25.  Intermittent A-fib, in and out of NSR, rates 90s.  Co-ox 76.1%.  Ongoing CRRT, did not utilize BiPAP overnight. FEES with SLP. ?2/25 confused.  Pressor requirements down some.  Got 1 unit of blood for hemoglobin of 7, Co. ox dropped. Vasopressin decreased to 0.03 units.  ?3/2 stopped epinephrine, Co. oximetry dropped quickly ?3/3 back on  epinephrine, with plan to eventually titrate vasopressin off and trial  dobutamine.  Currently on room air.  Co. oximetry's in the 40s, hypoglycemic requiring titration down on basal dose insulin after changing tube feeds to nocturnal ?3/6 off Epi since 3 am  ?3/9 Has remained off pressors since 3/6, remains confused with worsened agitation 3/8 per spouse  ?3/11 called back to evaluate patient for rectal bleeding on the floor.  Symptomatic anemia, hypotension. ? ?Interim History / Subjective:  ? ?Patient seen on the floor hypotensive with ongoing bright red rectal bleeding. ? ?Objective:  ?Blood pressure (!) 82/23, pulse 86, temperature 97.9 ?F (36.6 ?C), temperature source Oral, resp. rate 15, height 4' 11.5" (1.511 m), weight 74.4 kg, SpO2 99 %. ?   ?FiO2 (%):  [36 %] 36 %  ? ?Intake/Output Summary (Last 24 hours) at 03-Mar-2022 1840 ?Last data filed at 03/03/22 1800 ?Gross per 24 hour  ?Intake 1230 ml  ?Output 580 ml  ?Net 650 ml  ? ?Filed Weights  ? 02/02/22 0429 02/03/22 0518 03-03-2022 0340  ?Weight: 69.8 kg 72.5 kg 74.4 kg  ? ?Physical Examination: ?General: Chronically ill-appearing debilitated female lying in bed ?HEENT: NCAT, tracking appropriately ?Neuro: Sleepy does open eyes to voice ?CV: Tachycardic, regular rate rhythm, S1-S2 ?PULM: Diminished breath sounds bilaterally no crackles no wheeze ?GI: Soft, nontender nondistended, visible bright red blood per rectum ?Extremities: Upper extremity fistula ? ?Resolved problem:  ?Post-op thrombocytopenia  ?-Platelets drop to 30, swapped to Bival, HIT negative  ?Acute hypoxemia respiratory failure  ? ?Assessment & Plan:  ? ?Acute blood loss anemia ?Hemorrhagic shock ?Lower GI bleeding, rectal bleeding ?Status post rectal tube, now removed, question rectal ulcer ?Plan: ?Fluid and blood product resuscitation ?We will likely need additional IV access. ?PRBCs x1 unit ordered. ?Will likely need additional PRBCs. ? ?Acute decompensated R>L heart failure with cardiogenic shock, resolved ?-Felt secondary to end-stage RV failure (required ECMO  in 2016 post MVR) ?-Echo 01/15/22 with EF 50-55%, D-shaped septum with severe RV dysfunction and enlargement, s/p MV repair, s/p TV repair but still with severe TR ?-Milrinone stopped 3/1 ?-Vasopressors stopped 3/6 ?Severe 3 vessel CAD now S/P CABG ?Chronic hypotension ?P: ?Hypotension ?Was on Eliquis now being held ?Follow I's and O's ?Remains on 4 L nasal cannula ?Continue midodrine ? ?Pulmonary artery hypertension with cor-pulmonale, end-stage ?- mixed picture for PH (predominantly WHO Groups II-III but perhaps contributions from I & IV) ?Hx of PE ?P: ?Macitentan stopped per HF ?Supportive care  ? ?Morganella bacteremia treated ?P: ?Already completed course of antibiotics ? ?Atrial fibrillation on AC and amio ?P: ?Holding anticoagulation due to bleeding ? ?ESRD on HD- thrombosed AVF ?-IR to place [facility] 3/6 ?P: ?Nephrology following ?Continue to follow metabolic panel for any electrolyte derangements ?HD per nephrology ? ?Dysphagia ?P: ?SLP following and diet advanced to dsy 3 nectar thick  ? ?DM2 with hyperglycemia  ?P: ?SSI with CBGs ? ?Probable renal vasoplegia  ?-Off pressors since 3/6 ?P: ?Continue midodrine ? ?ICU delirium, on last admission ?-Per spouse patients confusion and agitation peaked 3/8  ?P: ?Delirium precautions ? ?Best Practice: (right click and "Reselect all SmartList Selections" daily)  ? ?Diet/type: tubefeeds  , dysphagia 3 ?DVT prophylaxis: holding d/t bleeding  ?GI prophylaxis: PPI ?Lines: Central line, Dialysis Catheter, Arterial Line, and yes and it is still needed  ?Foley:  N/A ?Code Status:  full code ?Last date of multidisciplinary goals of care discussion [Husband updated 3/6 at bedside]  ? ?Garner Nash, DO ?[facility] ?2022/02/11 6:45 PM   ? ? ?

## 2022-02-25 NOTE — Progress Notes (Signed)
Upon pt arrival to [facility] Nurse contacted PCCM and elink to talk about pt condition. Team consulted family, who chose to put pt on comfort care and change to DNR status. Pt is now on comfort monitoring and being kept comfortable. Family at bedside. ?

## 2022-02-25 NOTE — Progress Notes (Signed)
Cross covering ICU physician ? ?Family arrived at bedside updated at length. We will transition to comfort care at this time.  ? ?Orders placed and Rn updated ?

## 2022-02-25 NOTE — Progress Notes (Signed)
SLP Cancellation Note ? ?Patient Details ?Name: DONNAJEAN CHESNUT ?MRN: [id number] ?DOB: 19-Oct-1956 ? ? ?Cancelled treatment:       Reason Eval/Treat Not Completed: Fatigue/lethargy limiting ability to participate;Other (comment) (Spoke with RN who stated she is still too lethargic, hemoglobin is down, still with rectal bleeding. SLP will f/u for patient readiness next date.) ? ?Sonia Baller, MA, CCC-SLP ?Speech Therapy ? ?

## 2022-02-25 NOTE — Progress Notes (Signed)
? ?   ?  [address] ?      [place] [address] ?            [phone]   ? ?  ? ?10 Days Post-Op Procedure(s) (LRB): ?RIGHT HEART CATH (N/A) ? ?Subjective: ? ?Patient with increased delirium agitation overnight, Rapid Response evaluated patient this morning due to acute bright red BM. ? ?Objective: ?Vital signs in last 24 hours: ?Temp:  [97.3 ?F (36.3 ?C)-98.1 ?F (36.7 ?C)] 98.1 ?F (36.7 ?C) (03/11 0801) ?Pulse Rate:  [76-90] 87 (03/11 0801) ?Cardiac Rhythm: Normal sinus rhythm (03/10 1952) ?Resp:  [15-20] 20 (03/11 0801) ?BP: (95-177)/(35-167) 121/58 (03/11 0801) ?SpO2:  [91 %-100 %] 100 % (03/11 0801) ?Weight:  [74.4 kg] 74.4 kg (03/11 0340) ? ?Intake/Output from previous day: ?03/10 0701 - 03/11 0700 ?In: 1230 [NG/GT:1130; IV Piggyback:100] ?Out: 0  ? ?General appearance: no distress ?Heart: regular rate and rhythm ?Lungs: diminished on left ?Abdomen: soft, non-tender; bowel sounds normal; no masses,  no organomegaly ?Extremities: anasarca ?Wound: healing without evidence of infection ? ?Lab Results: ?Recent Labs  ?  02/03/22 ?0431 Mar 02, 2022 ?7322  ?WBC 10.1 9.6  ?HGB 8.9* 7.8*  ?HCT 28.7* 25.8*  ?PLT 223 229  ? ?BMET:  ?Recent Labs  ?  02/03/22 ?0431 03/02/2022 ?0254  ?NA 130* 132*  ?K 4.8 3.8  ?CL 96* 97*  ?CO2 23 25  ?GLUCOSE 158* 214*  ?BUN 38* 26*  ?CREATININE 3.46* 2.85*  ?CALCIUM 10.1 10.0  ?  ?PT/INR: No results for input(s): LABPROT, INR in the last 72 hours. ?ABG ?   ?Component Value Date/Time  ? PHART 7.402 01/30/2022 2208  ? HCO3 27.0 01/30/2022 2208  ? TCO2 28 01/30/2022 2208  ? ACIDBASEDEF 1.0 01/20/2022 1944  ? O2SAT 99 01/30/2022 2208  ? ?CBG (last 3)  ?Recent Labs  ?  02/03/22 ?2002 2022-03-02 ?2706 02-Mar-2022 ?0411  ?GLUCAP 71 187* 203*  ? ? ?Assessment/Plan: ?S/P Procedure(s) (LRB): ?RIGHT HEART CATH (N/A) ? ?CV- NSR with PVCs- on Amiodarone, Midodrine ?Pulm- off oxygen, CXR with small pleural effusions, continue aggressive pulmonary toilet ?Renal-ESRD, on HD per nephrology ?GI-  bloody BM this morning, Flexaseal in place, Hgb 7.8,monitor closely, may need GI evaluation if persists, [place] in place on tubes feedings ?Neuro- increased agitation/delirium- MRI shows acute infarct in posterior left basal ganglia.Marland Kitchen awaiting Neurology recommendations ?ID- afebrile, no leukocytosis-on Maxipime ?Dispo- patient remains ill, ? New GI bleeding today?, MRI confirms acute ischemic stroke, awaiting Neurology recs, care per primary ? ? LOS: 34 days  ? ? ?Ellwood Handler, PA-C ?03/02/22 ? ? ?

## 2022-02-25 NOTE — Progress Notes (Addendum)
Cross covering ICU physician called to bedside to eval decompensating pt.  ? ?Pt has been in hospital for >30 days. She is esrd with chronic hf, pulm htn and just was liberated from pressors/inotropic therapy 4 days ago. She has worsened clinically since that time with increased agitation and confusion.  At this time she is actively hemorrhaging from a gi source. She is hypotensive with map in 50's she is not alert and as poor respirations with foaming from mouth. She is unable to protect her airway and will need ett placement in addition to arterial line and cvc. She will then need eval with cta further req dialysis to clear and additionally the source my not been seen. She is not a surgical candidate and I do not feel this is survivable. I do not feel she will be able to be liberated from ventilator ? ?I have spoken with pt's husband and advised him of all this. I have asked him to gather family and come in as soon as possible.  ? ?I have changed her code status to DNR at this time after our conversation. He is coming to [place] and bringing family.  ? ?Updated bedside RN  ? ?35 min of critical care time spent discussing end of life goals ?

## 2022-02-25 NOTE — Progress Notes (Signed)
Blood transfusion started at 111mL/hr for first 63mins per protocol and then increased to 311mL/hr per verbal MD order.   ?

## 2022-02-25 NOTE — Assessment & Plan Note (Signed)
Pressure Injury 01/31/2022 Anus Right;Left Stage 2 -  Partial thickness loss of dermis presenting as a shallow open injury with a red, pink wound bed without slough. pink wound bed with loss of skin (Active)  ?02/19/2022 0946  ?Location: Anus  ?Location Orientation: Right;Left  ?Staging: Stage 2 -  Partial thickness loss of dermis presenting as a shallow open injury with a red, pink wound bed without slough.  ?Wound Description (Comments): pink wound bed with loss of skin  ?Present on Admission: No  ? ?

## 2022-02-25 NOTE — Progress Notes (Signed)
PT Cancellation Note ? ?Patient Details ?Name: Jody Nguyen ?MRN: [id number] ?DOB: 1956-11-09 ? ? ?Cancelled Treatment:    Reason Eval/Treat Not Completed: Medical issues which prohibited therapy Per RN, pt with continued acute BRBPR and lethargy. Will follow up when pt appropriate.  ? ?Wyona Almas, PT, DPT ?Acute Rehabilitation Services ?Pager [phone] ?Office [phone] ? ? ? ?Jody Nguyen ?2022/03/06, 2:28 PM ?

## 2022-02-25 NOTE — Progress Notes (Signed)
Pt found this morning to have large amount of fresh red blood from rectum with clotting.  MD paged, rapid response nurse notified and at bedside, and charge nurse present.  Pt does not have any new symptoms at this time.  Vitals taken and within normal range.  Pt will be closely monitored.   ? Feb 17, 2022 0801  ?Vitals  ?Temp 98.1 ?F (36.7 ?C)  ?Temp Source Oral  ?BP (!) 121/58  ?MAP (mmHg) 71  ?BP Location Left Leg  ?BP Method Automatic  ?Patient Position (if appropriate) Lying  ?Pulse Rate 87  ?Pulse Rate Source Monitor  ?ECG Heart Rate 88  ?Resp 20  ?Level of Consciousness  ?Level of Consciousness Alert  ?Oxygen Therapy  ?SpO2 100 %  ?O2 Device Nasal Cannula  ?O2 Flow Rate (L/min) 4 L/min  ?Pain Assessment  ?Pain Scale Faces  ?Faces Pain Scale 0  ?Glasgow Coma Scale  ?Eye Opening 4  ?Best Verbal Response (NON-intubated) 4  ?Best Motor Response 5  ?[place] Coma Scale Score 13  ?MEWS Score  ?MEWS Temp 0  ?MEWS Systolic 0  ?MEWS Pulse 0  ?MEWS RR 0  ?MEWS LOC 0  ?MEWS Score 0  ?MEWS Score Color Green  ? ?  ?

## 2022-02-25 NOTE — Progress Notes (Signed)
[place] visited pt. per page from charge RN sharing that pt. passed earlier this evening and there have been family at bedside.  When [facility] arrived many family members were gathered in pt.'s room and outside her door.  Pt.'s husband shared that pt. had been on 2H for an extended period of time the last few weeks and had just been transferred to a stepdown unit when she was readmitted to [facility] with severe bleeding.  Medical team called family to come to hospital emergently earlier this evening and they were present with pt. as she transitioned.  Husband grieving and weary; family supporting one another well and were grateful for RN making memorial handprints for them to take with them.  Quanah stayed with family until they left and assisted them in finding their way back to ED.   ? ?Lindaann Pascal, Chaplain ?Pager: [phone] ? ?

## 2022-02-25 NOTE — Progress Notes (Addendum)
Progress Note   Patient: Jody Nguyen YPP:[id number] DOB: 1956-10-22 DOA: 01/24/2022     34 DOS: the patient was seen and examined on 17-Feb-2022   Brief hospital course: Jody Nguyen is a 66 year old female with past medical history significant for pulmonary hypertension followed by cardiology at [facility], chronic diastolic congestive heart failure, previous MV repair, nonobstructive CAD per cath done in 2016, hypertension, hyperlipidemia, ESRD on HD MWF, CVA, type 2 diabetes, history of PE on Eliquis who presented to [facility] ED on 2/4 with complaint of persistent chest pain. Patient states she is having episodes of chest pain this past week.  Reports nonexertional pain all across her chest with radiation to the back and throbbing in nature.  Each episode lasts about 10 minutes.  She has had episodes of chest pain after dialysis but having it even on nondialysis days.  Associated with nausea and dyspnea.  Denies history of blood clots. Last dialysis session was on Friday and she still makes urine.  She was seen in the ED on 12/28/2021 for chest pain and troponins were elevated at 100 but flat and consistent with previous.  She was slightly hypotensive and given fluid bolus.  EKG with no acute changes.  Seen by cardiology on 12/29/2021 and it was felt that her chest pain and troponin elevation were due to sudden fluid volume shift as chest pain occurred during dialysis.  Plan was to monitor her symptoms and discuss settings with HD prior to pursuing invasive cardiac catheterization.  In the ED, WBC 3.8 (slightly low on recent labs as well), hemoglobin 10.7 (stable), platelet count 130k.  Sodium 137, potassium 3.2, chloride 91, bicarb 32, BUN 29, creatinine 7.2, glucose 119.  EKG showing sinus rhythm, new LAFB and RBBB, QTC prolongation. High-sensitivity troponin elevated but stable (210 > 201). COVID and flu negative.  Chest x-ray showing no active disease. While in the ED, patient had an  episode of sinus tachycardia with rate in the 130s to 140s and became hypotensive with systolic in the 45Y. 500 cc fluid bolus was given after which blood pressure improved and heart rate normalized.  ED physician discussed the case with cardiology, recommended admission for observation. TRH consulted.  She is found to have severe coronary calcification and multivessel CAD.  Recommendation was to obtain a surgical consultation for consideration for CABG and cardiothoracic surgery feels that she is high risk but she would likely benefit from a CABG.  Plan is for surgical intervention on Friday, 2/10/202.  Post CABG she suffered multiple complications including; persistent vasoplegic shock requiring weeks for pressor support, GNR bacteremia seen 2/17 (completed 7 days of ABT but became febrile again 3/5 and ABT resumed), renal failure requiring CRRT initiation (now on iHD and TDC placed 3/9), anemia/thrombocytopenia requiring transfusions, and ICU delirium She has now been pressor free since 3/6 and is stable for transfer out of ICU.  03/09 focal neurologic deficit with left sided weakness and right gaze preference, new from prior days.  CT with no acute changes, neurology was consulted and recommended brain MRI.   Transfer to [facility] 02/03/22      Assessment and Plan: * Chest pain, Multivessel CAD  Underwent left heart catheterization 01/04/2022 with severe coronary calcification and multivessel CAD with LAD 99%, first diagonal 90%, ostial circumflex to proximal circumflex 100%, proximal RCA 100% stenosis.  Now patient is sp CABG and tricuspid valve repair.  Continue with aspirin, ezetimibe and atorvastatin  No chest pain today.  Continue with post operative care, continue to be very weak and deconditioned.   Chronic combined systolic and diastolic heart failure (HCC) TTE with LVEF now 40-45%, LV mildly decreased function, no regional wall motion normalities, moderate LVH, RVSP 39.6, LA  moderately dilated, MV repair noted, severe TR, moderate TV stenosis.  Patient has recovered from shock, now off vasopressors. Continue blood pressure support with midodrine  Her blood pressure has been 110 to 120 mmHg.    Shock ([place]) Multifactorial shock, now clinically resolved, continue close blood pressure monitoring and blood pressure support with midodrine.   Gram-negative bacteremia Continue antibiotic therapy with cefepime and vancomycin. Blood culture for Morganella Morganii  Sensitive to ceftazidime, zosyn and ciprofloxacin.  Follow up blood cultures with no growth.   Patient completed antibiotic therapy on 03/05 7 days, but resumed antibiotic therapy with vancomycin and cefepime due to recurrent fever.  Patient with loose/ formed stool, she has a rectal tune in place.  She has been afebrile and now with rectal bleeding.   Plan to stop antibiotic therapy and check C diff.   Metabolic encephalopathy Prior history of CVA  Patient had ICU delirium, she has a NG tube in place for feeding due to swallow dysfunction.   New neurologic deficit, further work up with brain MRI with 5 mm focus of diffusion abnormality involving the posterior left basal ganglia, consistent with small acute to early subacute ischemic infarct. Multiple remote infarcts. Chronic encephalomalacia a the anterior inferior left frontal lobe.   Clinically today is more awake and interactive, follows commands and responds to questions appropriately.  Because rectal bleeding will stop anticoagulation.  Follow with neurology recommendations.  Anemia of chronic renal failure Continue renal replacement therapy per nephrology recommendations. Positive acute rectal bleeding today, her hgb is 7,8 and hct is 25.8 Check H&H this am, may need PRBC transfusion.   Atrial fibrillation [facility]) Patient had transitory SVT during her hospitalization.   Continue rate control with amiodarone. Continue telemetry  monitoring. Because of bleeding will continue holding anticoagulation.   Pulmonary hypertension (HCC) Continue oxymetry monitoring, holding on pulmonary hypertension therapy for now.  Patient has history of pulmonary embolism, because acute bleeding will hold on anticoagulation for now.   Type 2 diabetes mellitus ([place]) Patient on tube feedings.  Fasting glucose this am 214.  Continue glucose cover and monitoring with insulin sliding scale and basal insulin 12 units bid    End stage renal disease on dialysis (HCC) Hyponatremia,. Continue renal replacement therapy per nephrology recommendations   Today Na is 132 and K at 3,8 and serum bicarbonate 25.   Obesity (BMI 27-07.8) -Complicates overall prognosis and care -Estimated body mass index is 35.55 kg/m as calculated from the following:   Height as of this encounter: 4' 11.5" (1.511 m).   Weight as of this encounter: 81.2 kg.  -Weight Loss and Dietary Counseling given and discussed with patient needs for aggressive lifestyle changes/weight loss as this complicates all facets of care.   -Will need Outpatient follow-up with PCP.    Pressure injury of skin Pressure Injury 02/07/2022 Anus Right;Left Stage 2 -  Partial thickness loss of dermis presenting as a shallow open injury with a red, pink wound bed without slough. pink wound bed with loss of skin (Active)  02/07/2022 0946  Location: Anus  Location Orientation: Right;Left  Staging: Stage 2 -  Partial thickness loss of dermis presenting as a shallow open injury with a red, pink wound bed without slough.  Wound Description (Comments):  pink wound bed with loss of skin  Present on Admission: No    Acute hypoxemic respiratory failure (HCC) Clinically improved, continue oxymetry monitoring and supplemental 02 per [place]   GERD (gastroesophageal reflux disease) /GI Prophylaxis -C/w Pantoprazole 40 mg po Daily PRN GERD  S/P minimally invasive mitral valve repair-resolved as of  01/16/2022 -Has had a previous mitral valve repair and cardiology and cardiothoracic surgery is following  Essential hypertension-resolved as of 11-Feb-2022 She had an episode of hypotension in the ED which resolved after a 500 cc fluid bolus.   -Blood pressure currently stable.   -Currently On carvedilol 12.5 mg p.o. twice daily, losartan 50 mg p.o. daily, Hydralazine 50mg  PO TID at home. -Was holding home antihypertensives currently due to transient hypotension on admission but has been started on Metoprolol Tartrate 12.5 mg po BID  -C/wHydralazine 25mg  PO q8h PRN SBP >180 or SBP >110 -Continue monitor BP closely and per protocol -Last BP reading was 91/62        Subjective: Patient very weak and deconditioned, more awake and reactive than yesterday   Physical Exam: Vitals:   02/03/22 1957 02/03/22 2300 02-11-22 0340 2022/02/11 0801  BP: 114/60 106/68 (!) 111/59 (!) 121/58  Pulse: 82 85 90 87  Resp: 15 19 16 20   Temp: 97.9 F (36.6 C) 98 F (36.7 C) 97.9 F (36.6 C) 98.1 F (36.7 C)  TempSrc: Oral Axillary Oral Oral  SpO2: 91% 100% 100% 100%  Weight:   74.4 kg   Height:       Neurology awake and alert ENT with mild pallor. NG tube in place.  Cardiology with S1 and S2 present with no gallops, rubs or murmurs No JVD Trace lower extremity edema Respiratory with no wheezing or rales Abdomen soft  Data Reviewed:    Family Communication: I spoke with patient's husband at the bedside, we talked in detail about patient's condition, plan of care and prognosis and all questions were addressed. We spoke about code status, he will discuss with the rest of the family    Disposition: Status is: Inpatient Remains inpatient appropriate because: critically ill   Planned Discharge Destination:  to be determined   Author: Tawni Millers, MD 02/11/22 10:28 AM  For on call review [email or web address].

## 2022-02-25 NOTE — Death Summary Note (Addendum)
? ?   ?[id number] ?      [place] [address] ?            [phone]   ? ?  ?Patient ID: ?Jody Nguyen ?MRN: [id number] ?DOB/AGE: 01/18/1956 66 y.o. ? ?Admit date: 01-17-22 ?Death date:02-22-2022 ?Admission Diagnoses: ? ?Patient Active Problem List  ? Diagnosis Date Noted  ? Pressure injury of skin 02/03/2022  ? Metabolic encephalopathy 51/12/5850  ? Acute hypoxemic respiratory failure ([place])   ? Gram-negative bacteremia 01/16/2022  ? Shock ([place])   ? GERD (gastroesophageal reflux disease) 01/04/2022  ? Chest pain, Multivessel CAD 08/28/2021  ? Patellofemoral arthritis of left knee 04/21/2021  ? A-V fistula ([place]) 01/29/2020  ? Secondary renal hyperparathyroidism ([place]) 07/28/2019  ? Major depression in remission ([place]) 01/22/2019  ? End stage renal disease on dialysis ([place]) 10/09/2015  ? Cardiomyopathy ([place]) 09/10/2015  ? Atrial fibrillation ([place]) 09/10/2015  ? Chronic periodontitis 06/30/2015  ? Chronic combined systolic and diastolic heart failure ([place])   ? Pulmonary hypertension ([place]) 06/18/2015  ? Obesity (BMI 30-39.9) 03/29/2015  ? Type 2 diabetes mellitus ([place]) 11/22/2014  ? Vitamin D deficiency 03/25/2014  ? Anemia of chronic renal failure   ?Sepsis ? ?Final Diagnoses:  ?Patient Active Problem List  ? Diagnosis Date Noted  ? Pressure injury of skin 02/03/2022  ? Metabolic encephalopathy 77/82/4235  ? Acute hypoxemic respiratory failure ([place])   ? Gram-negative bacteremia 01/16/2022  ? Shock ([place])   ? GERD (gastroesophageal reflux disease) 01/04/2022  ? Chest pain, Multivessel CAD 08/28/2021  ? Patellofemoral arthritis of left knee 04/21/2021  ? A-V fistula ([place]) 01/29/2020  ? Secondary renal hyperparathyroidism ([place]) 07/28/2019  ? Major depression in remission ([place]) 01/22/2019  ? End stage renal disease on dialysis ([place]) 10/09/2015  ? Cardiomyopathy ([place]) 09/10/2015  ? Atrial fibrillation ([place]) 09/10/2015  ? Chronic periodontitis 06/30/2015  ? Chronic combined systolic and diastolic heart  failure ([place])   ? Pulmonary hypertension ([place]) 06/18/2015  ? Obesity (BMI 30-39.9) 03/29/2015  ? Type 2 diabetes mellitus ([place]) 11/22/2014  ? Vitamin D deficiency 03/25/2014  ? Anemia of chronic renal failure   ?Sepsis ? ? ?History of Present Illness: ? ?Jody Nguyen is a 66 year old female who recently presented on several occasions to the ER with chest pain.  On January 17, 2022 she presented to the ER with symptoms of chest pain with radiation to both arms.  She has had multiple episodes over the previous several days.  She has a history of end-stage renal disease on hemodialysis.  Pain was worse during dialysis treatment.  He had a episode of sinus tachycardia while in the emergency room.  She has a history of pulmonary hypertension and is [facility] by [facility].  There is associated chronic diastolic heart failure and she is known to have undergone a previous mitral valve repair in 2016 by Dr. Roxy Manns.  At that time she had nonobstructive coronary artery disease per catheterization.  She has multiple other medical risk factors including hypertension, hyperlipidemia, type 2 diabetes and a previous history of pulmonary embolism on Eliquis.  High-sensitivity troponins were elevated in the ER.  She was COVID and flu negative.  Chest x-ray showed no active disease.  He received a fluid bolus for stabilization and heart rate improved from the episode of tachycardia.  She was felt to require admission for further monitoring and stabilization.  She is noted to have had a nuclear study in October 2022 showing no ischemia.  It  is noted that her mitral valve repair in 6384 was complicated by hypoxic respiratory failure requiring ECMO with transfer to [facility] and eventual tracheostomy.  At this time she developed the renal failure.  VQ scan was low risk for pulmonary embolism.  A repeat echocardiogram was performed on 01/02/2022 with the full report listed below.  Ejection fraction was  estimated to be 40 to 45% with mildly reduced LV function.  The right ventricular systolic function was mildly reduced.  There was some enlargement of the right ventricle and elevated pulmonary artery pressures were mild.  There is no evidence for mitral valve regurgitation but there was moderate mitral stenosis.  The tricuspid valve showed severe regurgitation and moderate stenosis.  The aortic valve was unremarkable.  Cardiac catheterization was performed today that shows severe 99% proximal LAD disease, 90% first diagonal lesion, ostial circumflex to proximal circumflex lesion with 100% stenosis and a proximal RCA lesion with 100% stenosis.  Coronaries are noted to be severely calcified.  LVEDP was 23.  We are asked to see the patient for consideration of CABG due to the severity of the anatomy and significant symptoms. ? ?Hospital Course: ? ?The patient was evaluated by Dr. Roxan Hockey who felt patient would benefit from coronary artery bypass grafting procedure and possible repair of Tricuspid Valve.  He felt the patient would be very high risk with previous cardiac surgery with complications requiring ECMO and long hospitalization previously.  The patient and her family understood the risk and were agreeable to proceed.  She was taken to the operating room and underwent CABG x 3 utilizing LIMA to LAD, SVG to Diagonal, and SVG to PDA.  She also underwent closure of Patent Foramen Ovale, Tricuspid Valve Annuloplasty, and endoscopic harvest of greater saphenous vein from her right leg.  She tolerated the procedure and was taken to the SICU in stable condition. She was on Milrinone, Epinephrine, Nor Epinephrine, and Vasopressin drips. She remained intubated but gradually less sedated. Pulmonary/CCM managed vent. She was able to respond to questions and commands. With her history of ESRD, nephrology continued to follow post op and arrange for CRRT accordingly. She was started on tube feedings for nutritional  support. Per pulmonary, try SBT today. Tube feedings were held on 02/14 secondary to emesis. Regarding her rhythm, she was sinus tachycardia 02/15. Pressors continued to be weaned as able.  ?She had thrombocytopenia post op. Her platelet count went as low as 49,000. HIT was checked and was negative. Her WBC count went up to 23,000 on 02/15. She remained afebrile and no sign of wound infection. She has been on Solu cortef so was monitored closely. Patient was successfully extubated 02/16. Patient had no thrill or bruit in right forearm fistula 02/16. It had previously been infiltrated but was now thrombosed. Per vascular surgery, patient will need new access once more stable. She received CRRT via left femoral temporary HD catheter. Tube feedings were resumed as she had no further emesis. Nephrology followed patient post op and arranged for CRRT accordingly. She was put on Bivalirudin as she has a history of PE. Swallow study was ordered 02/18. Speech pathology recommend continuing cortrak for primary nutrition, allowing ice chips, sips of honey-thick liquids and bites of applesauce from floor stock for pleasure. Speech pathology continued to follow patient and recommendations were followed accordingly. Pulmonary/CCM placed new lines on 02/18 secondary to elevated WBC and fever. She was put on Vancomycin and Zosyn. Blood culture showed Morganella Morganii. She was then  put on Ceftriaxone. Co ox decreased so Milrinone was restarted on 02/19. Echo was done on 02/19 that showed severe RV dysfunction. Platelet count continued to increase and was up to 154,000. Patient was very deconditioned so PT ordered. She went into a fib with RVR last evening 02/21. She was put on IV Amiodarone the am of 02/22. Her H and H on 02/25 was decreased to 7.1 and 23.5. She was transfused. Patient was more interactive and alert over this past weekend. She was put on Macitentan for [facility] but this was later stopped. She had post cardiotomy  cardiogenic shock which per advanced heart failure, was mostly due to end stage RV failure. She had Swan placed and [place] 03/01. Per Dr. Haroldine Laws, Milrinone decreased to 0.125. Milrinone was then stopped on 03/01 as with lo

## 2022-02-25 NOTE — Progress Notes (Signed)
SLP Cancellation Note ? ?Patient Details ?Name: Jody Nguyen ?MRN: [id number] ?DOB: January 28, 1956 ? ? ?Cancelled treatment:       Reason Eval/Treat Not Completed: Medical issues which prohibited therapy. Nursing reports pt with abnormal blood pressure and bleeding. SLP to f/u as able. ? ? ? ?Ellwood Dense, MA, CCC-SLP ?Acute Rehabilitation Services ?Office Number: [phone] ? ?Jody Nguyen ?02-08-22, 11:41 AM ?

## 2022-02-25 NOTE — Progress Notes (Signed)
Patient with large amounts of blood and clots per rectum, rectal tube removed as ordered and patient cleaned up. Dr. Cathlean Sauer aware of bleeding and clots. Bp 87/22 heart rate 82 sats 98% on 4L Bedside RN aware. Alylah Blakney, Bettina Gavia ?RN  ?

## 2022-02-25 NOTE — Progress Notes (Signed)
eLink Physician-Brief Progress Note ?Patient Name: Jody Nguyen ?DOB: 01-Feb-1956 ?MRN: [id number] ? ? ?Date of Service ? 2022-03-05  ?HPI/Events of Note ? Asked by RN to have ground team assess the patient. Camera not connected in this room. I am told patient is lethargic and drowsy. Getting 1 unit RBC and RN is trying to place another IV line. Has one arm with an AV fistula and is also ordered for a CTA.   ?eICU Interventions ? D/w RN. Sending ABG ?Also notified ground team to assess her.   ? ? ? ?Intervention Category ?Major Interventions: Hemorrhage - evaluation and management ? ?Wretha Laris G Aaron Bostwick ?2022/03/05, 7:40 PM ?

## 2022-02-25 NOTE — Significant Event (Signed)
Rapid Response Event Note  ? ?Reason for Call :  ?GI bleed ? ?Initial Focused Assessment:  ?Initially I responded to this patient who was having new onset of some GI bleeding that was present in her flexi-seal as well as some blood coming around the flexi-seal. No visible hemorrhoids seen. Patient received anticoagulation on 02/03/22 and none today.  ? ?The RN called back stating that her bleeding was worse. There was a visible puddle of bright red blood in her bed as well as 200 cc of mixed blood and stool in the collection bag. While cleaning her up there were very large clots.  ? ?RN reports some low BP readings. Her HR is WNL. ? ?BP 120/65 ?HR 83 ?O2 100 ? ?Interventions:  ?Patient cleaned up and new gown placed.  ? ?Plan of Care:  ?Repeat H&H and transfuse PRBC as needed.  ?MD bedside and he assessed the patient.  ? ?Event Summary:  ? ?MD Notified: Dr. Cathlean Sauer is bedside ?Call Time: 1445 ?Arrival Time: 1642 ?End Time: 9037 ? ?Venetia Maxon, RN ?

## 2022-02-25 NOTE — Progress Notes (Addendum)
?[facility] ?Progress Note  ? ? ? ? ? ?Subjective:   ? ?Had agitation overnight. RR called this am for acute BRBPR.   ? ?Objective:   ?BP (!) 121/58 (BP Location: Left Leg)   Pulse 88   Temp 98 ?F (36.7 ?C) (Oral)   Resp 17   Ht 4' 11.5" (1.511 m)   Wt 74.4 kg   SpO2 99%   BMI 32.57 kg/m?  ? ?Physical Exam: ?MQK:MMNOT, cortrak in place, eyes open, nad ?CVS: reg RR ?Resp: coarse ?Abd: soft obese nontender ?Ext: 1+ RUE edema, no other edema ? LIJ TDC in place ? ? ?OP HD: [facility] ?3:15h   450/A1.5   75.5kg   2K/2Ca bath  P2   AVF  Hep  2500 ? - COVID neg 2/04, hep B SAg neg on 01/03/22 ?- Hectoral 40mcg IV q HD ?- Sensipar $RemoveBef'90mg'VzTtBoCGck$  PO q HD ?- Mircera 110mcg IV q 2 weeks (ordered, not yet given. Last Mircera 100 on 12/07/21) ?  ?  ?  ?Assessment/Plan:  ? CAD s/p CABG x3, TR sp TVR:  per CT surgery  ? BRBPR - overnight. Hb high 7's, down from mid 8s. Per pmd.  ?ESRD:  normally MWF schedule. SP CRRT 2/07- 3/07. HD Monday.  ?Shock - cardiogenic and septic w/ RV failure. RHC 01/29/2022-->  well-compensated hemodynamics. Continues on midodrine tid. Off all pressors and inotropes.   ? Volume - no need to push vol down now per cardiology. Wt's up but no vol ^ on exam.  ?Morganella bacteremia -  s/p course of IV abx ? Anemia CKD: Sq darbe 100ug weekly on Saturday. 1st 2/25. ? Metabolic bone disease: resume home binders and sensipar when taking PO. Cont IV vdra.  ? Infiltrated AVF - AVF infiltrated on 2/7. Post-op AVF is nonfunctional, VVS aware. Temp cath removed and is now SP new Harmon per IR on 3/09.  ?Pulm HTN:  Followed at [facility], holding macitenan (opsumit) ? T2DM-per primary service ? Hx of MV replacement - in 2016 ?Dispo: in ICU ? ?Kelly Splinter, MD ?2022/03/05, 1:24 PM ? ? ? ? ? ?Labs: ?BMET ?Recent Labs  ?Lab 01/29/22 ?0345 01/29/22 ?1518 01/30/22 ?0345 01/30/22 ?1750 01/30/22 ?2208 01/31/22 ?7711 02/01/22 ?6579 02/02/22 ?0383 02/03/22 ?0431 2022/03/05 ?3383  ?NA 135 135 135 136 137 137 135 135 130* 132*  ?K  4.7 4.2 4.2 4.0 4.3 4.1 4.5 4.1 4.8 3.8  ?CL 102 102 104 104  --  104 102 100 96* 97*  ?CO2 $Rem'22 23 22 25  'FEar$ --  $R'24 23 24 23 25  'Sv$ ?GLUCOSE 240* 148* 131* 95  --  155* 151* 153* 158* 214*  ?BUN $Rem'11 11 11 11  'inOb$ --  12 25* 25* 38* 26*  ?CREATININE 1.12* 0.97 0.87 0.95  --  1.11* 2.13* 2.41* 3.46* 2.85*  ?CALCIUM 9.7 9.2 9.4 9.5  --  9.4 9.7 10.1 10.1 10.0  ?PHOS 1.7* 3.8 2.3* 2.0*  --  1.8* 2.9 2.8  --   --   ? ? ?CBC ?Recent Labs  ?Lab 02/01/22 ?2919 02/02/22 ?1660 02/03/22 ?0431 03-05-22 ?6004 03-05-22 ?0954  ?WBC 15.6* 14.6* 10.1 9.6  --   ?NEUTROABS 9.9* 9.3* 6.0 6.3  --   ?HGB 7.9* 8.2* 8.9* 7.8* 7.8*  ?HCT 26.4* 27.0* 28.7* 25.8* 25.7*  ?MCV 102.3* 101.1* 100.0 100.0  --   ?PLT 199 210 223 229  --   ? ? ? ?  ?Medications:   ? ?  stroke: mapping  our early stages of recovery book   Does not apply Once  ? amiodarone  200 mg Per Tube BID  ? chlorhexidine gluconate (MEDLINE KIT)  15 mL Mouth Rinse BID  ? Chlorhexidine Gluconate Cloth  6 each Topical Daily  ? darbepoetin (ARANESP) injection - NON-DIALYSIS  100 mcg Subcutaneous Q Sat-1800  ? doxercalciferol  9 mcg Intravenous Q M,W,F-HD  ? [START ON 02/09/2022] ezetimibe  10 mg Per Tube Daily  ? feeding supplement (PROSource TF)  45 mL Per Tube BID  ? feeding supplement (VITAL 1.5 CAL)  600 mL Per Tube Q24H  ? insulin aspart  0-20 Units Subcutaneous Q4H  ? insulin detemir  12 Units Subcutaneous BID  ? latanoprost  1 drop Both Eyes QHS  ? midodrine  15 mg Per Tube Q6H  ? multivitamin  1 tablet Per Tube QHS  ? pantoprazole sodium  40 mg Per Tube Daily  ? rosuvastatin  10 mg Per Tube QHS  ? sodium chloride flush  10-40 mL Intracatheter Q12H  ? sodium chloride flush  3 mL Intravenous Q12H  ? ? ? ? ?

## 2022-02-25 NOTE — Progress Notes (Addendum)
Pt transferred to [facility] 2H-18 and report given. Pt's husband called and notified of transfer.

## 2022-02-25 DEATH — deceased

## 2022-02-28 ENCOUNTER — Other Ambulatory Visit (HOSPITAL_COMMUNITY): Payer: Medicare Other

## 2022-02-28 ENCOUNTER — Ambulatory Visit: Payer: Medicare Other | Admitting: Cardiology

## 2022-03-01 ENCOUNTER — Ambulatory Visit: Payer: Medicare Other | Admitting: Cardiology

## 2022-03-14 ENCOUNTER — Ambulatory Visit: Payer: Medicare Other | Admitting: Adult Health

## 2022-06-20 ENCOUNTER — Ambulatory Visit: Payer: Medicare Other | Admitting: Internal Medicine

## 2022-12-13 ENCOUNTER — Encounter: Payer: Medicare Other | Admitting: Internal Medicine
